# Patient Record
Sex: Male | Born: 1955
Health system: Southern US, Community
[De-identification: ages and names within clinical notes are randomized; demographics above are authoritative.]

## PROBLEM LIST (undated history)

## (undated) DIAGNOSIS — R739 Hyperglycemia, unspecified: Secondary | ICD-10-CM

## (undated) DIAGNOSIS — I639 Cerebral infarction, unspecified: Secondary | ICD-10-CM

## (undated) DIAGNOSIS — F319 Bipolar disorder, unspecified: Secondary | ICD-10-CM

## (undated) DIAGNOSIS — J189 Pneumonia, unspecified organism: Secondary | ICD-10-CM

## (undated) DIAGNOSIS — K921 Melena: Secondary | ICD-10-CM

## (undated) DIAGNOSIS — Z8673 Personal history of transient ischemic attack (TIA), and cerebral infarction without residual deficits: Secondary | ICD-10-CM

## (undated) DIAGNOSIS — G894 Chronic pain syndrome: Secondary | ICD-10-CM

## (undated) DIAGNOSIS — R51 Headache: Secondary | ICD-10-CM

## (undated) DIAGNOSIS — E119 Type 2 diabetes mellitus without complications: Secondary | ICD-10-CM

## (undated) DIAGNOSIS — M109 Gout, unspecified: Secondary | ICD-10-CM

## (undated) DIAGNOSIS — I442 Atrioventricular block, complete: Secondary | ICD-10-CM

## (undated) DIAGNOSIS — I219 Acute myocardial infarction, unspecified: Secondary | ICD-10-CM

## (undated) DIAGNOSIS — F419 Anxiety disorder, unspecified: Secondary | ICD-10-CM

## (undated) DIAGNOSIS — M4802 Spinal stenosis, cervical region: Secondary | ICD-10-CM

## (undated) DIAGNOSIS — K625 Hemorrhage of anus and rectum: Secondary | ICD-10-CM

## (undated) DIAGNOSIS — R569 Unspecified convulsions: Secondary | ICD-10-CM

## (undated) DIAGNOSIS — F1910 Other psychoactive substance abuse, uncomplicated: Secondary | ICD-10-CM

## (undated) DIAGNOSIS — Z7901 Long term (current) use of anticoagulants: Secondary | ICD-10-CM

## (undated) DIAGNOSIS — R42 Dizziness and giddiness: Secondary | ICD-10-CM

## (undated) DIAGNOSIS — Z86711 Personal history of pulmonary embolism: Secondary | ICD-10-CM

## (undated) DIAGNOSIS — I10 Essential (primary) hypertension: Secondary | ICD-10-CM

## (undated) DIAGNOSIS — K219 Gastro-esophageal reflux disease without esophagitis: Secondary | ICD-10-CM

## (undated) DIAGNOSIS — M1990 Unspecified osteoarthritis, unspecified site: Secondary | ICD-10-CM

## (undated) DIAGNOSIS — E785 Hyperlipidemia, unspecified: Secondary | ICD-10-CM

## (undated) DIAGNOSIS — G43909 Migraine, unspecified, not intractable, without status migrainosus: Secondary | ICD-10-CM

## (undated) DIAGNOSIS — N183 Chronic kidney disease, stage 3 (moderate): Secondary | ICD-10-CM

## (undated) DIAGNOSIS — D649 Anemia, unspecified: Secondary | ICD-10-CM

## (undated) DIAGNOSIS — N179 Acute kidney failure, unspecified: Secondary | ICD-10-CM

## (undated) DIAGNOSIS — E7800 Pure hypercholesterolemia, unspecified: Secondary | ICD-10-CM

## (undated) DIAGNOSIS — I2510 Atherosclerotic heart disease of native coronary artery without angina pectoris: Secondary | ICD-10-CM

## (undated) DIAGNOSIS — R519 Headache, unspecified: Secondary | ICD-10-CM

## (undated) DIAGNOSIS — F329 Major depressive disorder, single episode, unspecified: Secondary | ICD-10-CM

## (undated) DIAGNOSIS — I209 Angina pectoris, unspecified: Secondary | ICD-10-CM

## (undated) DIAGNOSIS — F1410 Cocaine abuse, uncomplicated: Secondary | ICD-10-CM

## (undated) DIAGNOSIS — D72819 Decreased white blood cell count, unspecified: Secondary | ICD-10-CM

## (undated) DIAGNOSIS — N289 Disorder of kidney and ureter, unspecified: Secondary | ICD-10-CM

## (undated) DIAGNOSIS — F32A Depression, unspecified: Secondary | ICD-10-CM

## (undated) HISTORY — DX: Hyperlipidemia, unspecified: E78.5

## (undated) HISTORY — DX: Cerebral infarction, unspecified: I63.9

## (undated) HISTORY — DX: Long term (current) use of anticoagulants: Z79.01

## (undated) HISTORY — DX: Personal history of pulmonary embolism: Z86.711

## (undated) HISTORY — DX: Hyperglycemia, unspecified: R73.9

## (undated) HISTORY — DX: Type 2 diabetes mellitus without complications: E11.9

## (undated) HISTORY — DX: Acute myocardial infarction, unspecified: I21.9

## (undated) HISTORY — DX: Essential (primary) hypertension: I10

## (undated) HISTORY — DX: Pure hypercholesterolemia, unspecified: E78.00

## (undated) HISTORY — DX: Melena: K92.1

## (undated) HISTORY — DX: Chronic kidney disease, stage 3 (moderate): N18.3

## (undated) HISTORY — DX: Other psychoactive substance abuse, uncomplicated: F19.10

## (undated) HISTORY — DX: Unspecified convulsions: R56.9

## (undated) HISTORY — PX: SHOULDER SURGERY: SHX246

## (undated) HISTORY — PX: CARDIAC CATHETERIZATION: SHX172

## (undated) HISTORY — PX: CORONARY ANGIOPLASTY: SHX604

## (undated) HISTORY — PX: EYE SURGERY: SHX253

## (undated) HISTORY — DX: Gout, unspecified: M10.9

## (undated) HISTORY — DX: Acute kidney failure, unspecified: N17.9

## (undated) HISTORY — DX: Atrioventricular block, complete: I44.2

## (undated) HISTORY — PX: RECONSTRUCT / STABILIZE DISTAL ULNA: SUR1072

## (undated) HISTORY — DX: Decreased white blood cell count, unspecified: D72.819

## (undated) HISTORY — DX: Chronic pain syndrome: G89.4

## (undated) HISTORY — DX: Gastro-esophageal reflux disease without esophagitis: K21.9

## (undated) HISTORY — DX: Migraine, unspecified, not intractable, without status migrainosus: G43.909

## (undated) HISTORY — PX: THUMB FUSION: SUR636

## (undated) HISTORY — DX: Anemia, unspecified: D64.9

## (undated) HISTORY — PX: INGUINAL HERNIA REPAIR: SUR1180

## (undated) HISTORY — DX: Personal history of transient ischemic attack (TIA), and cerebral infarction without residual deficits: Z86.73

---

## 1998-04-11 ENCOUNTER — Emergency Department (HOSPITAL_COMMUNITY): Admission: EM | Admit: 1998-04-11 | Discharge: 1998-04-11 | Payer: Self-pay | Admitting: Emergency Medicine

## 1999-05-26 ENCOUNTER — Emergency Department (HOSPITAL_COMMUNITY): Admission: EM | Admit: 1999-05-26 | Discharge: 1999-05-26 | Payer: Self-pay | Admitting: Emergency Medicine

## 2000-08-14 ENCOUNTER — Encounter: Payer: Self-pay | Admitting: Emergency Medicine

## 2000-08-14 ENCOUNTER — Emergency Department (HOSPITAL_COMMUNITY): Admission: EM | Admit: 2000-08-14 | Discharge: 2000-08-15 | Payer: Self-pay | Admitting: Emergency Medicine

## 2001-02-14 ENCOUNTER — Emergency Department (HOSPITAL_COMMUNITY): Admission: EM | Admit: 2001-02-14 | Discharge: 2001-02-14 | Payer: Self-pay | Admitting: Emergency Medicine

## 2001-02-14 ENCOUNTER — Encounter: Payer: Self-pay | Admitting: Emergency Medicine

## 2001-03-15 ENCOUNTER — Ambulatory Visit (HOSPITAL_COMMUNITY): Admission: RE | Admit: 2001-03-15 | Discharge: 2001-03-15 | Payer: Self-pay | Admitting: Orthopedic Surgery

## 2001-03-15 ENCOUNTER — Encounter: Payer: Self-pay | Admitting: Orthopedic Surgery

## 2003-01-16 ENCOUNTER — Emergency Department (HOSPITAL_COMMUNITY): Admission: EM | Admit: 2003-01-16 | Discharge: 2003-01-16 | Payer: Self-pay | Admitting: Emergency Medicine

## 2003-02-02 ENCOUNTER — Ambulatory Visit (HOSPITAL_BASED_OUTPATIENT_CLINIC_OR_DEPARTMENT_OTHER): Admission: RE | Admit: 2003-02-02 | Discharge: 2003-02-02 | Payer: Self-pay | Admitting: General Surgery

## 2003-02-02 HISTORY — PX: INGUINAL HERNIA REPAIR: SUR1180

## 2003-05-30 ENCOUNTER — Emergency Department (HOSPITAL_COMMUNITY): Admission: EM | Admit: 2003-05-30 | Discharge: 2003-05-30 | Payer: Self-pay | Admitting: Emergency Medicine

## 2004-04-18 ENCOUNTER — Emergency Department (HOSPITAL_COMMUNITY): Admission: EM | Admit: 2004-04-18 | Discharge: 2004-04-18 | Payer: Self-pay | Admitting: Emergency Medicine

## 2004-05-22 ENCOUNTER — Ambulatory Visit (HOSPITAL_COMMUNITY): Admission: RE | Admit: 2004-05-22 | Discharge: 2004-05-22 | Payer: Self-pay | Admitting: General Surgery

## 2004-05-22 ENCOUNTER — Ambulatory Visit (HOSPITAL_BASED_OUTPATIENT_CLINIC_OR_DEPARTMENT_OTHER): Admission: RE | Admit: 2004-05-22 | Discharge: 2004-05-22 | Payer: Self-pay | Admitting: General Surgery

## 2005-05-28 ENCOUNTER — Emergency Department (HOSPITAL_COMMUNITY): Admission: EM | Admit: 2005-05-28 | Discharge: 2005-05-28 | Payer: Self-pay | Admitting: Emergency Medicine

## 2005-10-22 ENCOUNTER — Emergency Department (HOSPITAL_COMMUNITY): Admission: EM | Admit: 2005-10-22 | Discharge: 2005-10-22 | Payer: Self-pay | Admitting: Emergency Medicine

## 2006-03-05 ENCOUNTER — Emergency Department (HOSPITAL_COMMUNITY): Admission: EM | Admit: 2006-03-05 | Discharge: 2006-03-05 | Payer: Self-pay | Admitting: Emergency Medicine

## 2006-07-26 IMAGING — CR DG KNEE COMPLETE 4+V*R*
4 series · 4 of 4 positions shown · non-contrast
Comparison: none

CLINICAL DATA: Fell

Right knee 4 view:
Comparison 02/14/2001. There is no evidence of fracture.  Normal alignment. There
is no evidence of arthropathy or other focal bone abnormality.  Soft tissues are
unremarkable.

[t knee ap right]
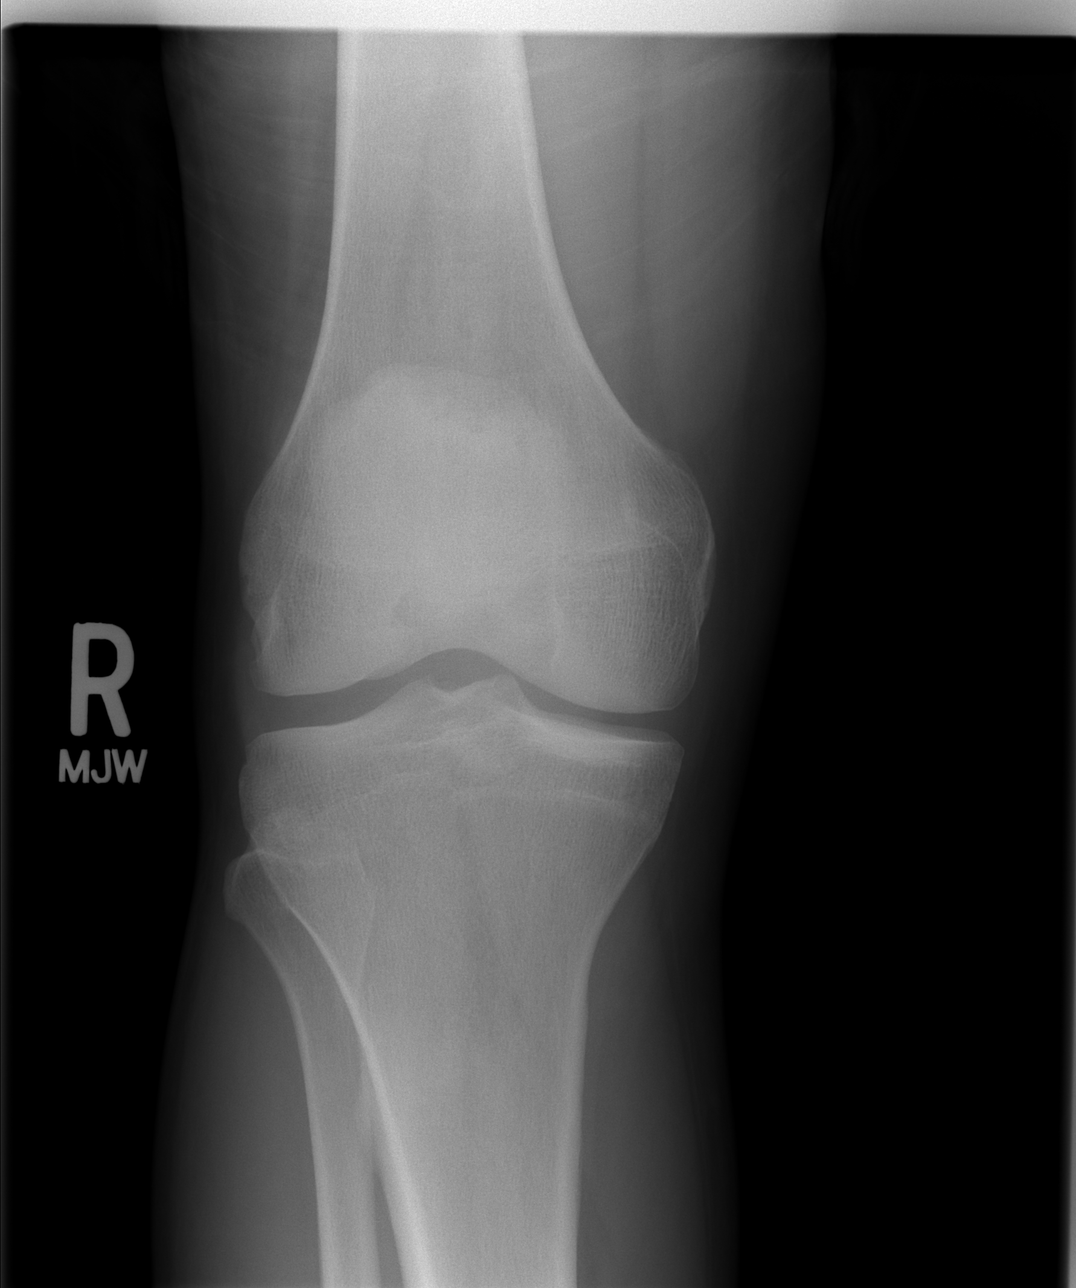

[t knee oblique right (1 of 2)]
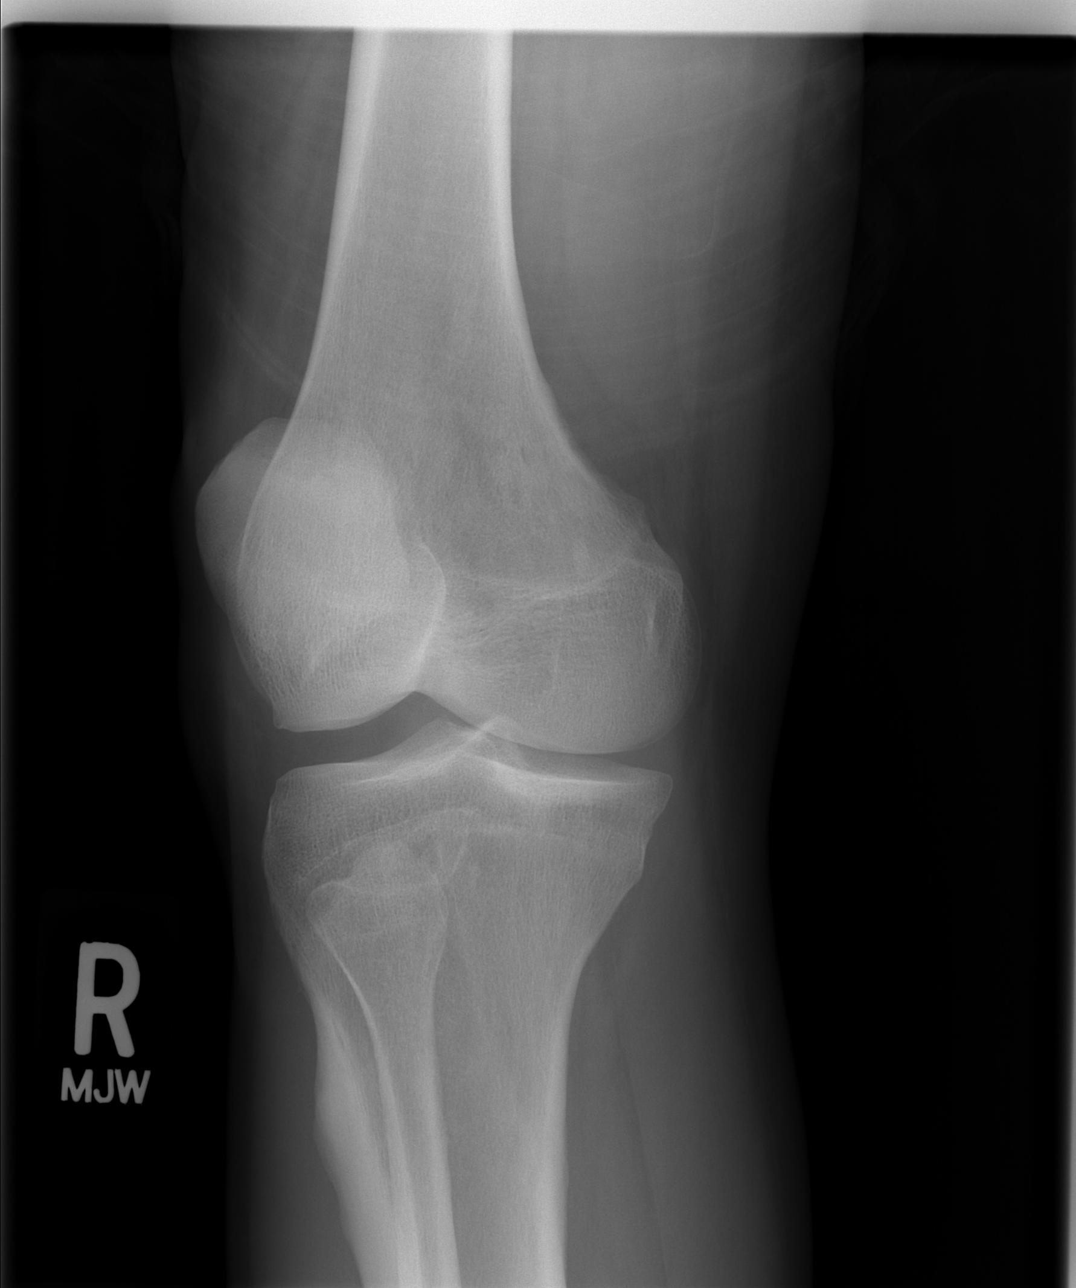

[t knee oblique right (2 of 2)]
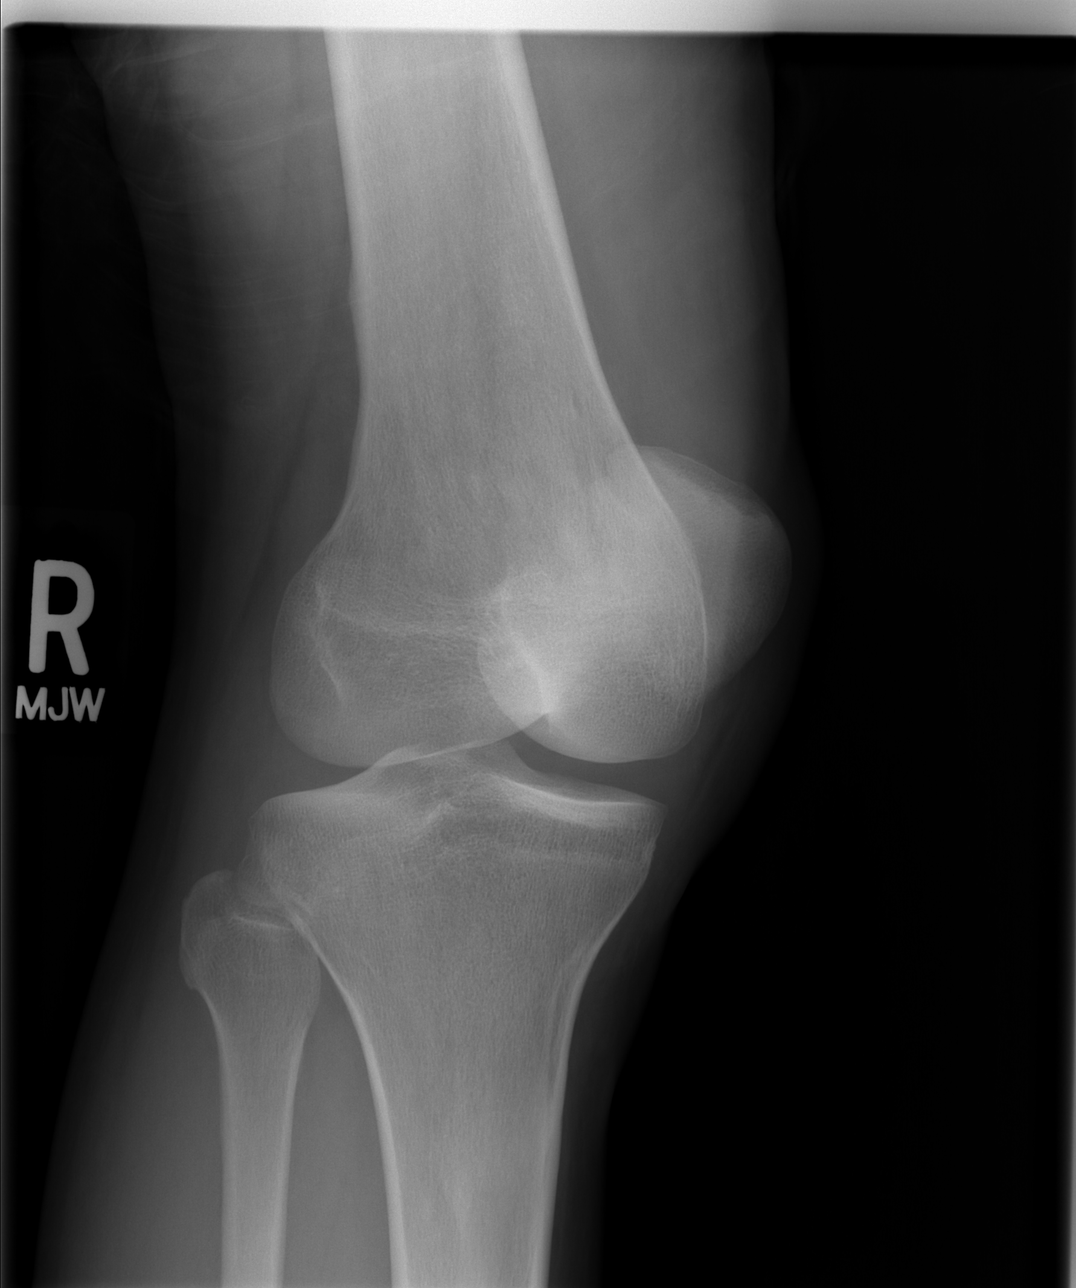

[t knee lat right]
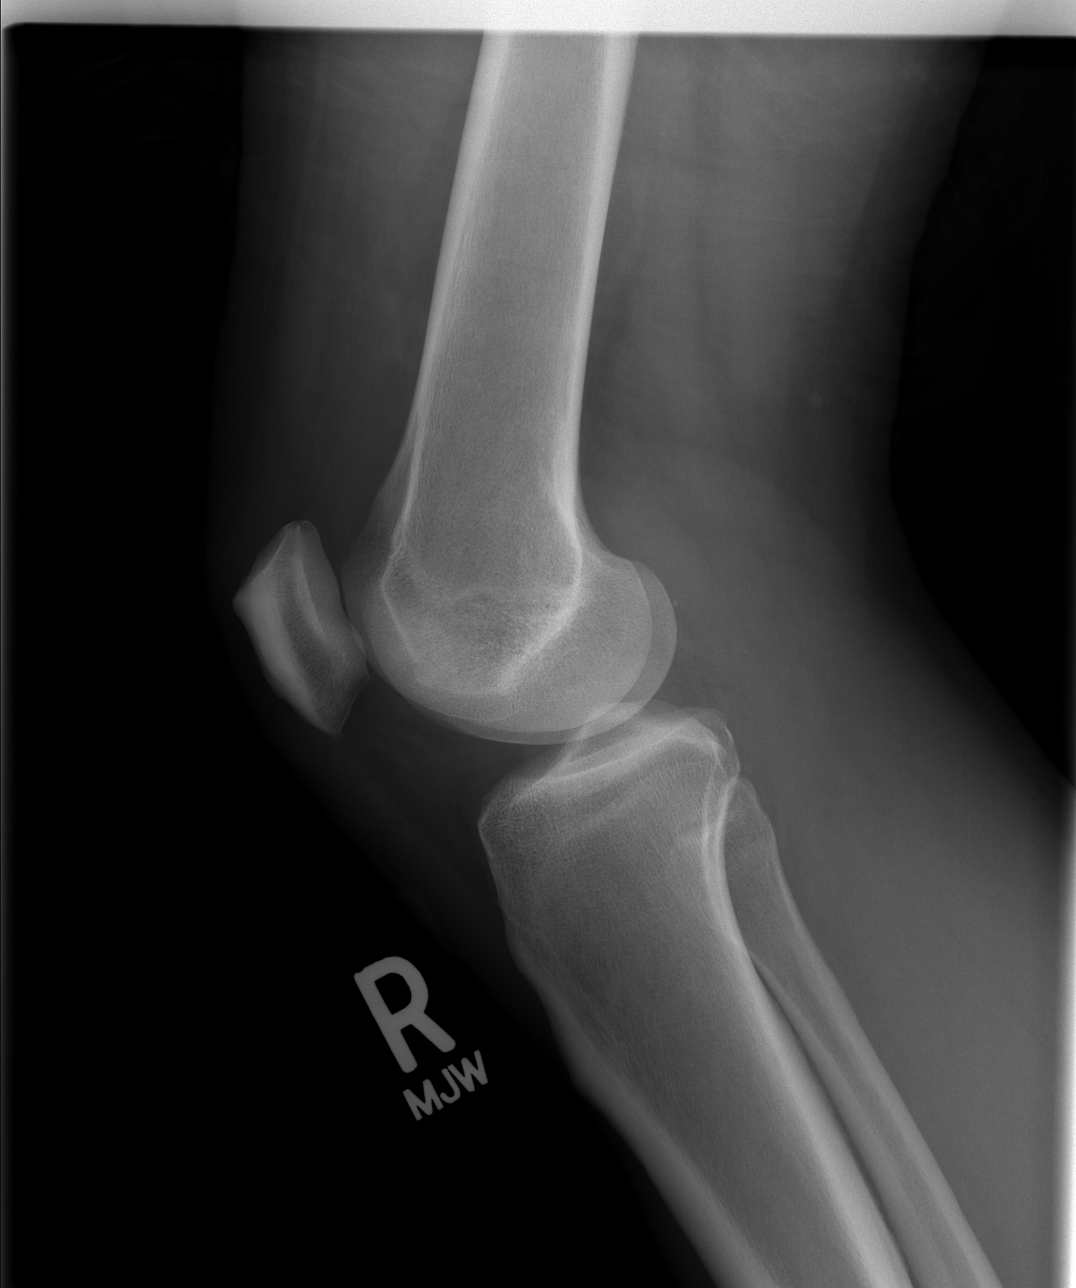

[4 of 4 positions shown; findings below may reference images not displayed]

IMPRESSION: Negative.

## 2006-08-27 ENCOUNTER — Emergency Department (HOSPITAL_COMMUNITY): Admission: EM | Admit: 2006-08-27 | Discharge: 2006-08-27 | Payer: Self-pay | Admitting: Emergency Medicine

## 2006-10-05 ENCOUNTER — Emergency Department (HOSPITAL_COMMUNITY): Admission: EM | Admit: 2006-10-05 | Discharge: 2006-10-05 | Payer: Self-pay | Admitting: Emergency Medicine

## 2006-10-22 DIAGNOSIS — I219 Acute myocardial infarction, unspecified: Secondary | ICD-10-CM

## 2006-10-22 HISTORY — DX: Acute myocardial infarction, unspecified: I21.9

## 2007-03-24 ENCOUNTER — Emergency Department (HOSPITAL_COMMUNITY): Admission: EM | Admit: 2007-03-24 | Discharge: 2007-03-25 | Payer: Self-pay | Admitting: Emergency Medicine

## 2007-03-29 ENCOUNTER — Ambulatory Visit (HOSPITAL_COMMUNITY): Admission: RE | Admit: 2007-03-29 | Discharge: 2007-03-29 | Payer: Self-pay | Admitting: Emergency Medicine

## 2007-05-17 ENCOUNTER — Inpatient Hospital Stay (HOSPITAL_COMMUNITY): Admission: EM | Admit: 2007-05-17 | Discharge: 2007-05-19 | Payer: Self-pay | Admitting: Emergency Medicine

## 2007-05-17 ENCOUNTER — Ambulatory Visit: Payer: Self-pay | Admitting: *Deleted

## 2007-06-16 ENCOUNTER — Ambulatory Visit: Payer: Self-pay | Admitting: Cardiovascular Disease

## 2007-06-18 ENCOUNTER — Emergency Department (HOSPITAL_COMMUNITY): Admission: EM | Admit: 2007-06-18 | Discharge: 2007-06-18 | Payer: Self-pay | Admitting: Emergency Medicine

## 2007-10-28 ENCOUNTER — Observation Stay (HOSPITAL_COMMUNITY): Admission: EM | Admit: 2007-10-28 | Discharge: 2007-10-29 | Payer: Self-pay | Admitting: Emergency Medicine

## 2007-10-28 ENCOUNTER — Ambulatory Visit: Payer: Self-pay | Admitting: Cardiology

## 2007-11-18 ENCOUNTER — Ambulatory Visit: Payer: Self-pay | Admitting: Cardiovascular Disease

## 2007-12-26 IMAGING — CT CT HEAD W/O CM
4 of 5 series · 16 of 47 positions shown, 17 images · IV contrast (agent unspecified)
Comparison: None

CLINICAL DATA: Headache with right facial numbness. Right shoulder and arm
pain.
TECHNIQUE: 5mm collimated images were obtained from the base of the skull
through the vertex according to standard protocol without contrast.

HEAD CT WITHOUT CONTRAST:
TECHNIQUE: Multidetector CT imaging of the cervical spine was performed. 
Sagittal and coronal plane reformatted images were reconstructed from the axial
CT data, and were also reviewed.

[Series 3: head trauma 4.8 h47s · axial · 0.47mm/px · z∈[-83,-33]mm · 2 of 30 slices shown, 3 images]
[im 10/30  brain]
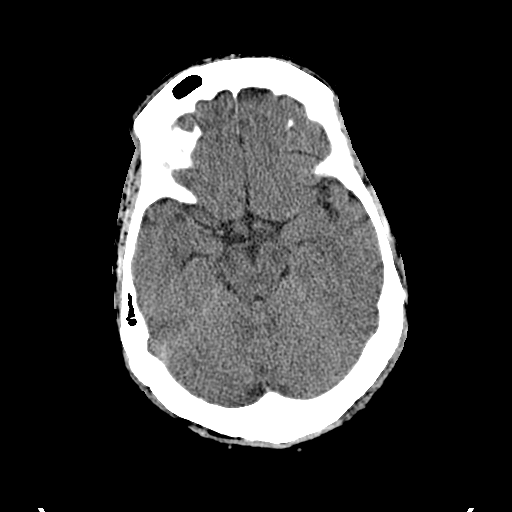
[im 10/30  bone]
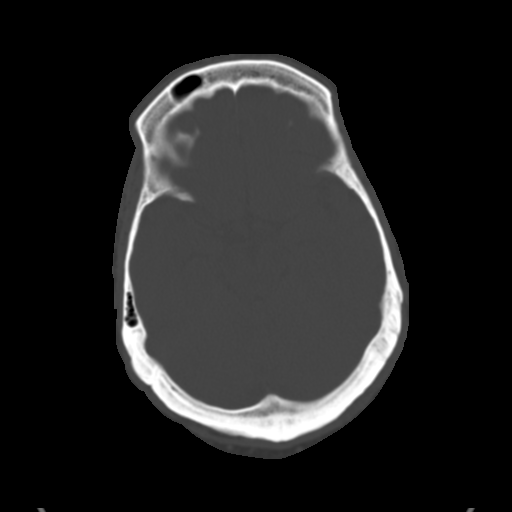
[im 20/30  brain]
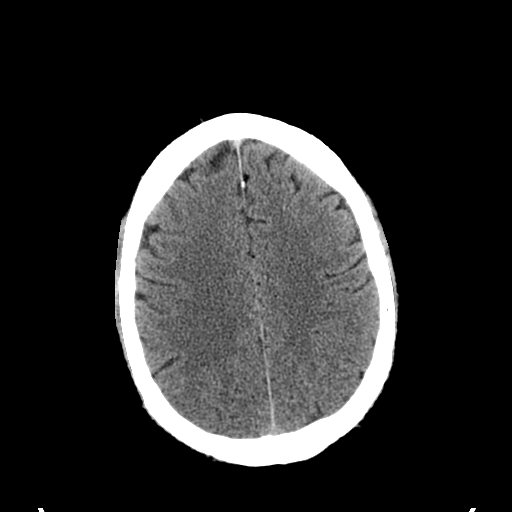

[Series 602: ax · axial · 0.38mm/px · z∈[-311,-201]mm · 8 of 71 slices shown]
[im 8/71  brain]
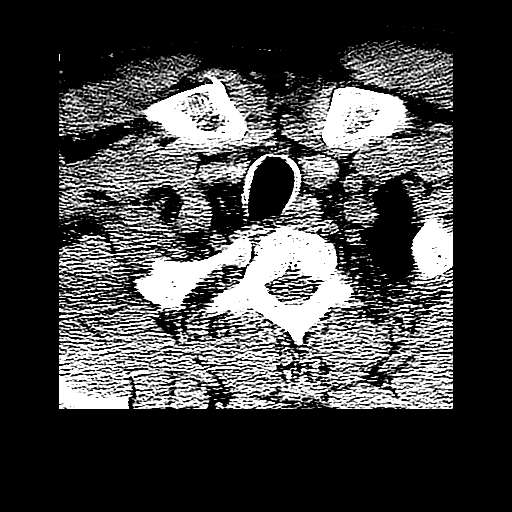
[im 15/71  brain]
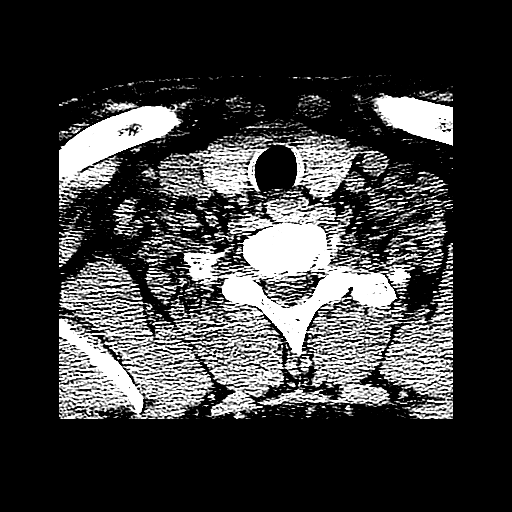
[im 22/71  brain]
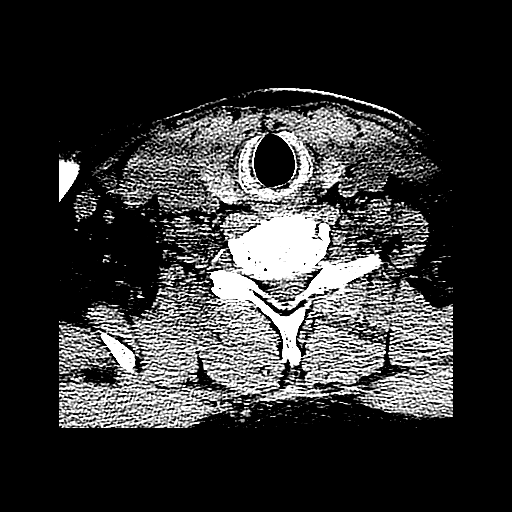
[im 29/71  brain]
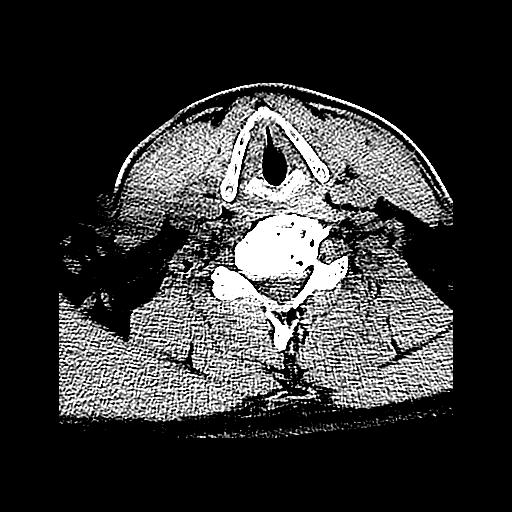
[im 43/71  brain]
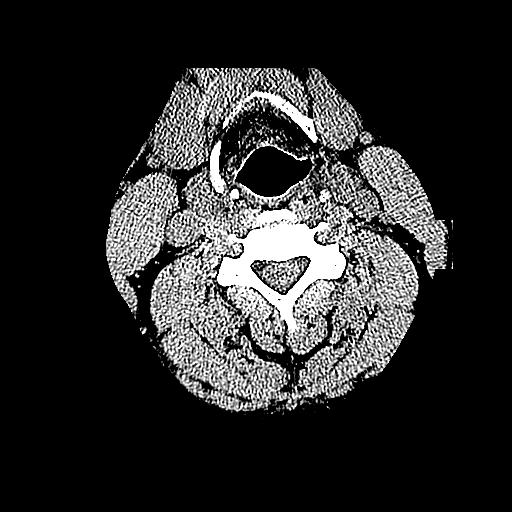
[im 50/71  brain]
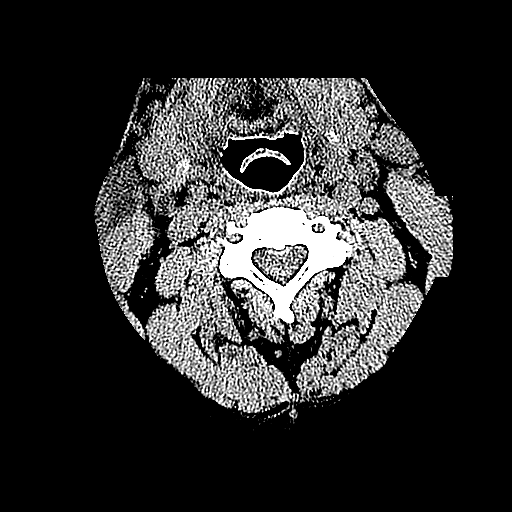
[im 57/71  brain]
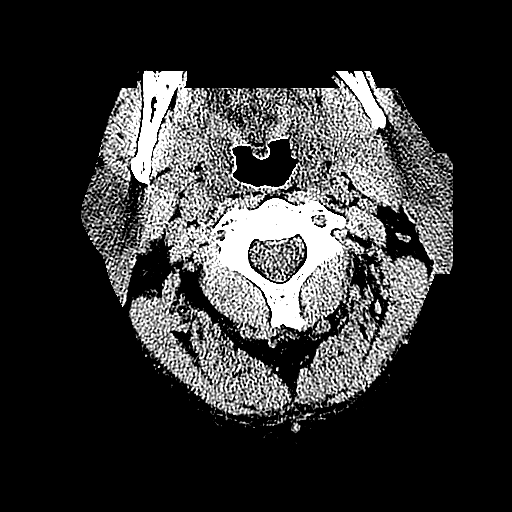
[im 64/71  brain]
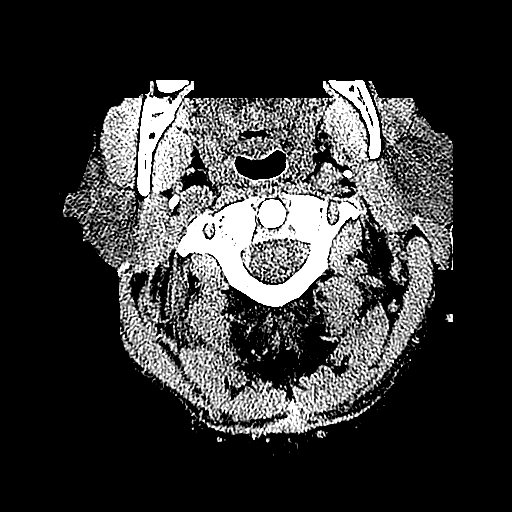

[Series 605: cor · coronal · 0.38mm/px · 3 of 37 slices shown]
[im 13/37  brain]
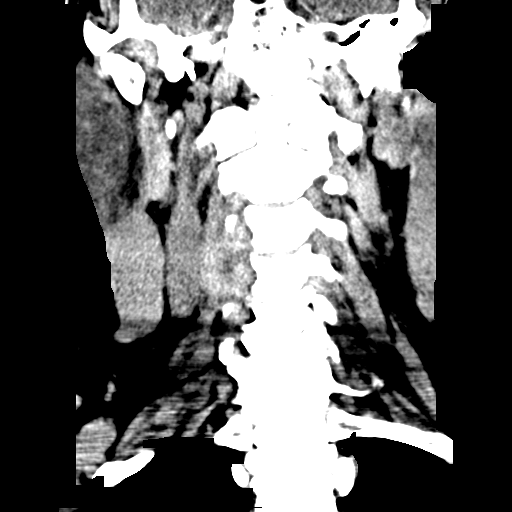
[im 17/37  brain]
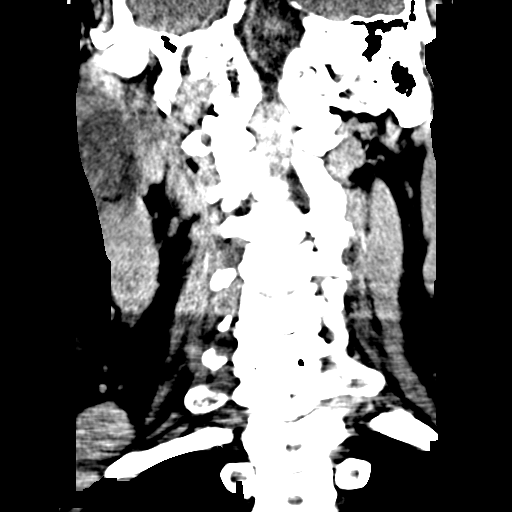
[im 21/37  brain]
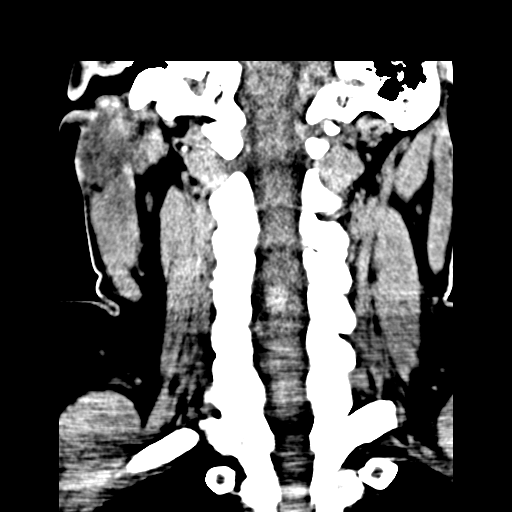

[Series 606: sag · sagittal · 0.38mm/px · 3 of 44 slices shown]
[im 15/44  brain]
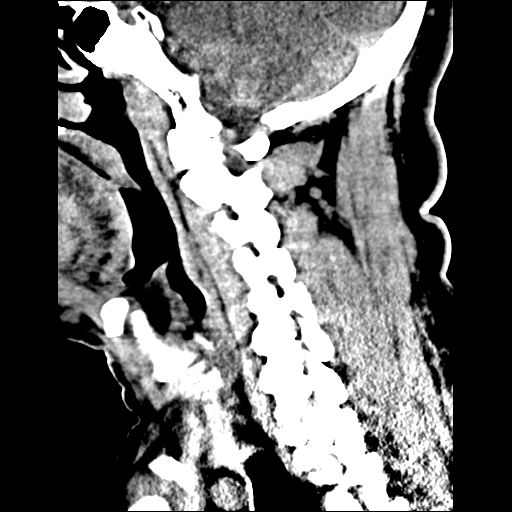
[im 22/44  brain]
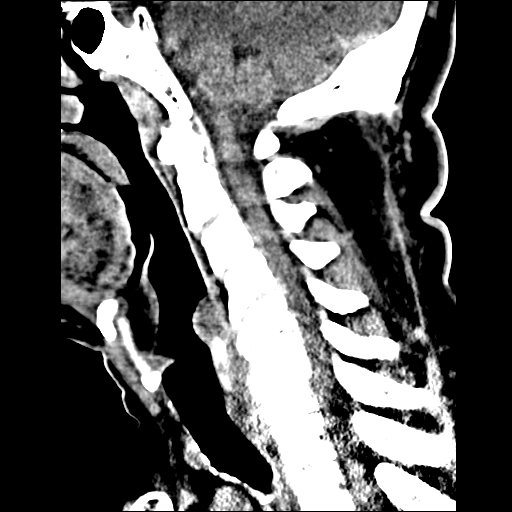
[im 29/44  brain]
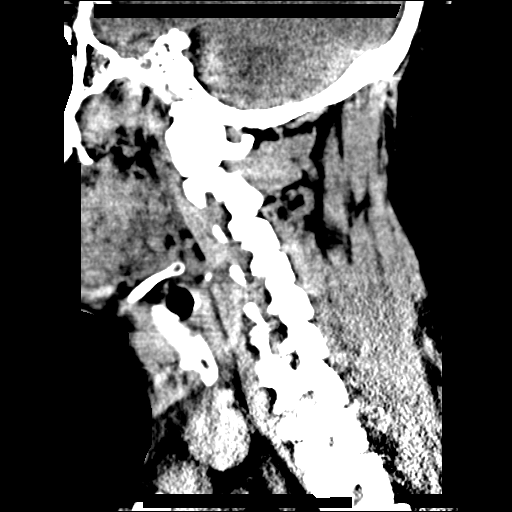

[16 of 47 positions shown; findings below may reference images not displayed]

FINDINGS: There is no evidence for acute hemorrhage, hydrocephalus, mass/mass-effect or
abnormal extra-axial fluid collection.  No definite CT evidence for acute
ischemia.  The visualized paranasal sinuses are clear.
IMPRESSION: No acute intracranial abnormality. 

Areas of acute ischemia may not be apparent on a head CT for up to 24-48 hours.

CERVICAL SPINE CT WITHOUT CONTRAST:
FINDINGS: Imaging was obtained from the skull base through the T2 vertebral
body. There is no evidence for acute fracture or gross subluxation. Reversal of
the normal cervical lordosis is noted. Loss of disc height is seen at C4-C5,
C5-C6, and C6-C7. Trace retrolisthesis of C5 on 6 is probably related to the
loss of disc height at this level. There is degenerative end plate change around
C5-C6 and C6-C7 discs. The facets are well aligned bilaterally.

Axial images reveal a moderate central disc herniation at C4-C5 which
obliterates the anterior thecal space and deforms the cervical spinal cord at
this level. AP diameter of the spinal canal is narrowed to about 8 mm. Foramina
are patent.

At C5-C6, there is posterior broad-based disc bulge with posterior bony endplate
spurring. Spinal stenosis is noted at this level with narrowing of the central
canal to 8 mm in AP diameter. Uncinate spurring causes moderate bilateral
foraminal narrowing.

At C6-C7, posterior disc osteophyte complex generates spinal stenosis with AP
diameter of the canal measuring about 9 mm. Uncinate spurring at this level
generates biforaminal encroachment, right greater than left.
IMPRESSION: No acute bony abnormality.

Degenerative disc disease in the mid cervical spine. There is a focal central
moderate disc herniation at C4-C5 which deforms the spinal cord and generates
central canal stenosis.

Posterior bulging disc with endplate spurring at C5-C6 and C6-C7 with spinal
stenosis at each level. There is associated bony foraminal encroachment
bilaterally at each level as well.

MRI may prove helpful to further evaluate, as warranted.

## 2007-12-31 IMAGING — MR MR CERVICAL SPINE W/O CM
4 of 7 series · 21 of 48 positions shown · IV contrast (agent unspecified)
Comparison: CT cervical spine 03/24/07

CLINICAL DATA: Severe headaches, facial numbness, shooting pain right arm.
MRI CERVICAL SPINE WITHOUT CONTRAST:
TECHNIQUE: Multiplanar and multiecho pulse sequences of the cervical spine, to include the craniocervical junction and cervicothoracic junction, were obtained according to standard protocol without IV contrast.

[Series 1: sag loc · axial · 7.0mm · 1.09mm/px · z∈[+0,+140]mm · 3 of 15 slices shown]
[im 3/15]
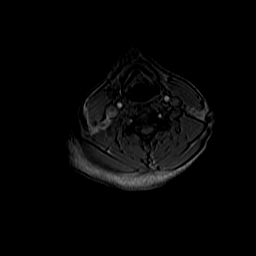
[im 9/15]
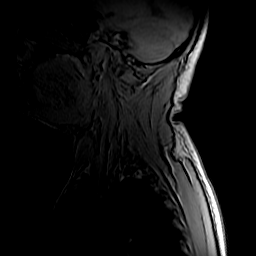
[im 13/15]
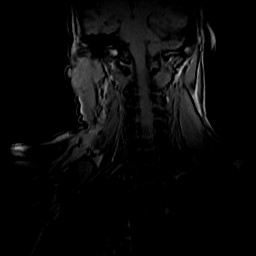

[Series 3: T2 · sagittal · 3.0mm · 0.43mm/px · 5 of 12 slices shown (1 of 2)]
[im 1/12]
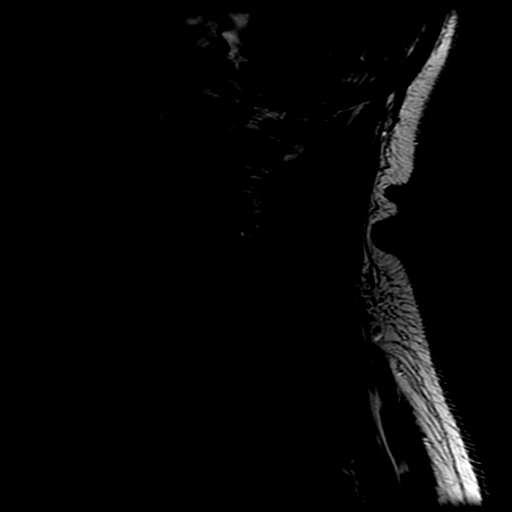
[im 3/12]
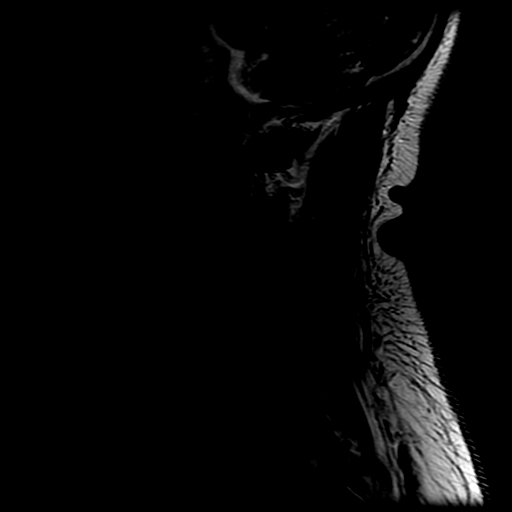
[im 6/12]
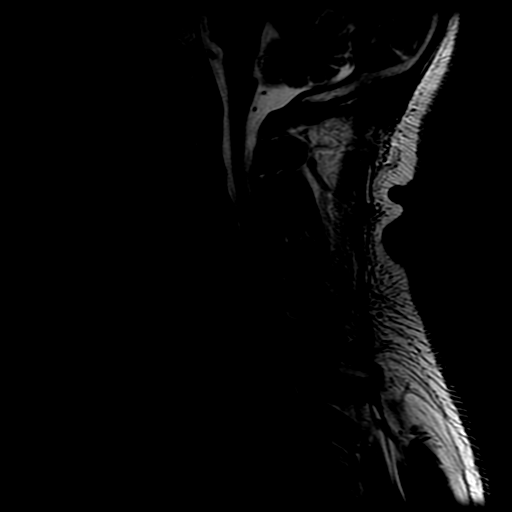
[im 9/12]
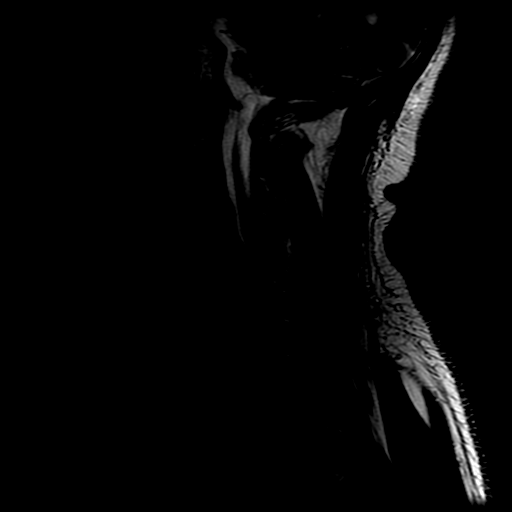
[im 12/12]
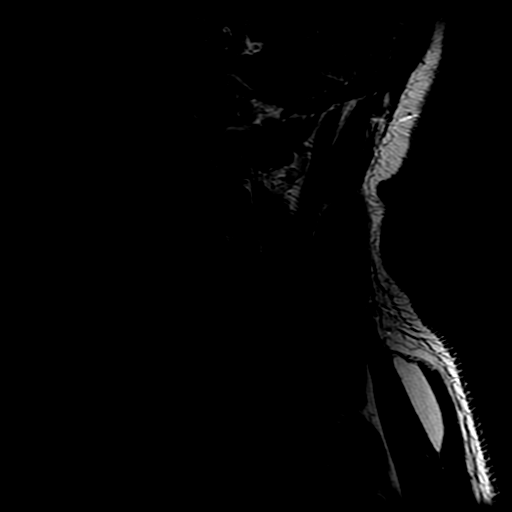

[Series 5: STIR · sagittal · 3.0mm · 0.43mm/px · 5 of 12 slices shown]
[im 1/12]
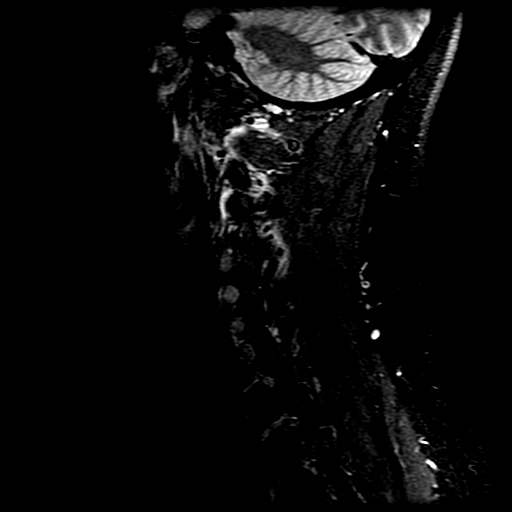
[im 3/12]
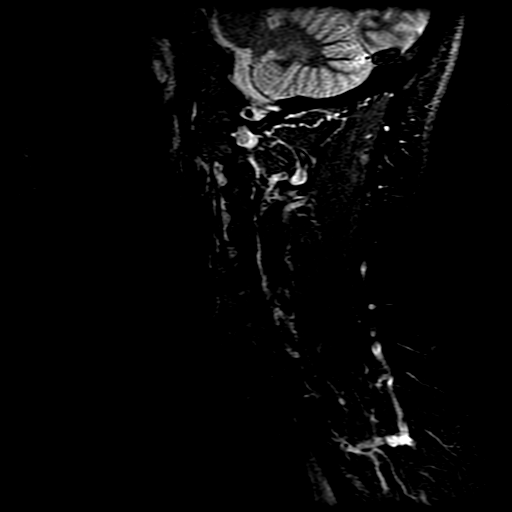
[im 6/12]
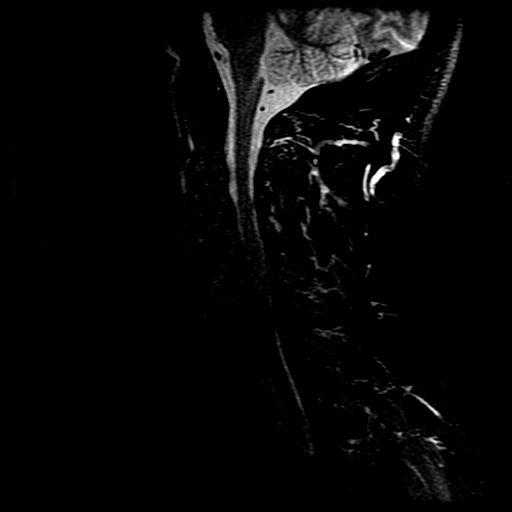
[im 9/12]
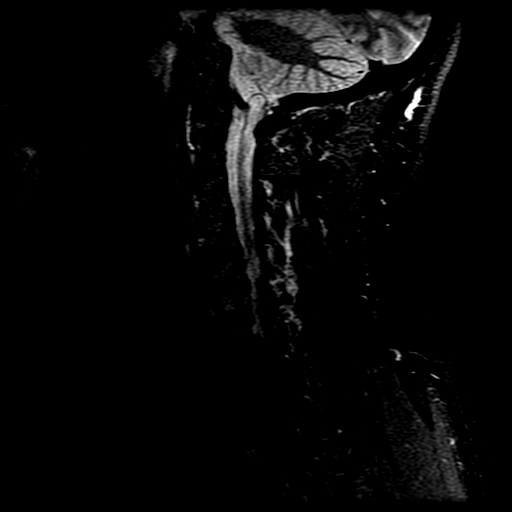
[im 12/12]
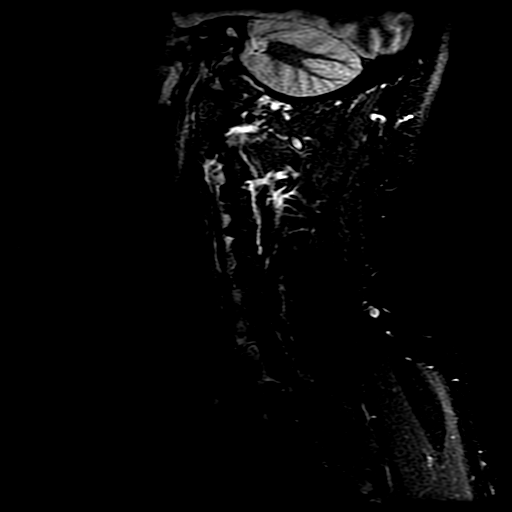

[Series 8: T2 · axial · 3.0mm · 0.39mm/px · z∈[-48,+24]mm · 8 of 22 slices shown (2 of 2)]
[im 1/22]
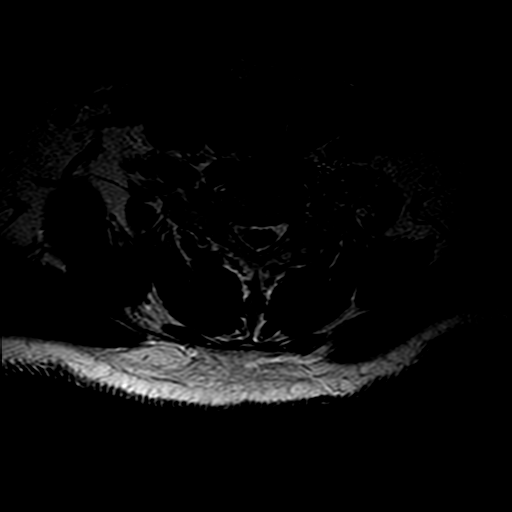
[im 3/22]
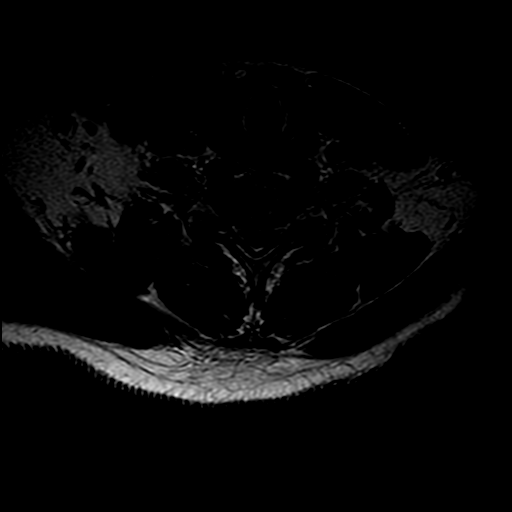
[im 8/22]
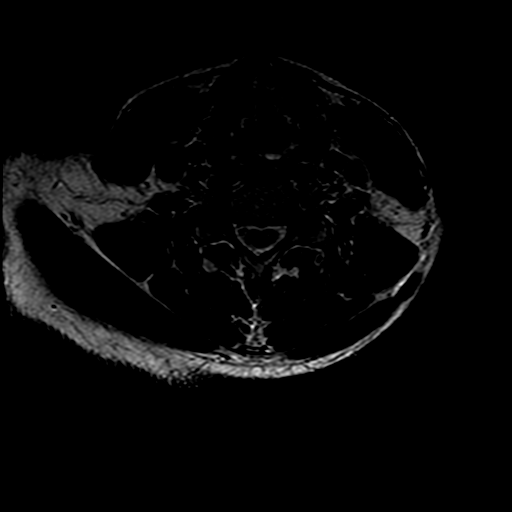
[im 10/22]
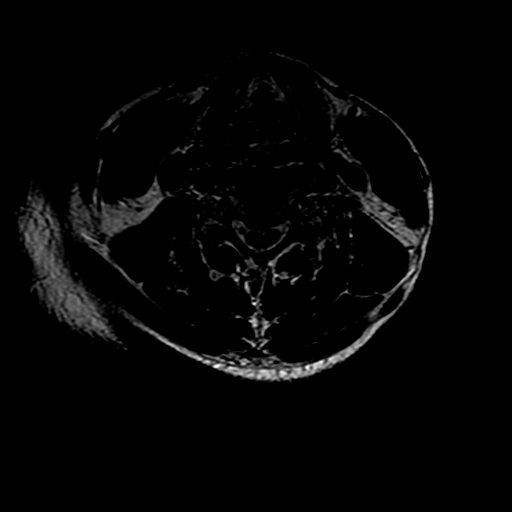
[im 12/22]
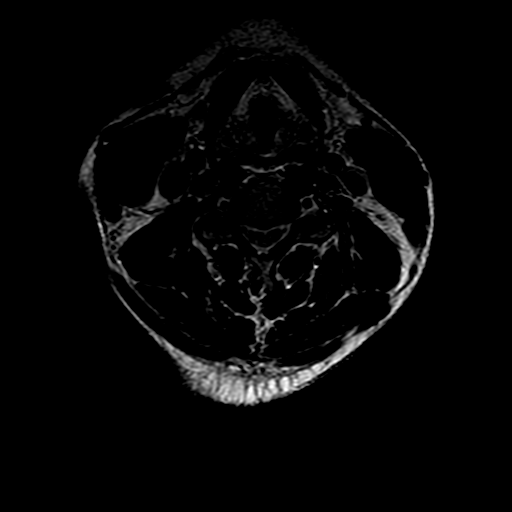
[im 15/22]
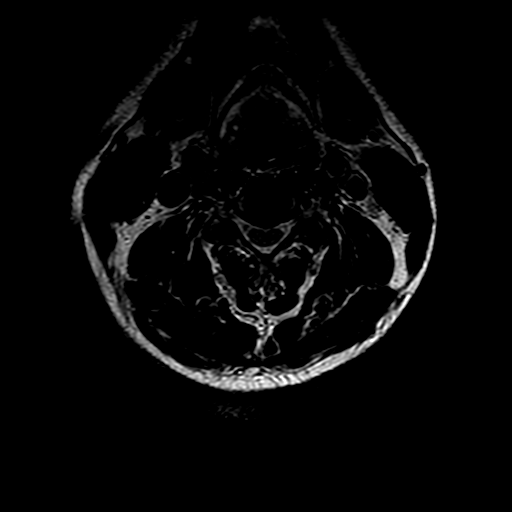
[im 19/22]
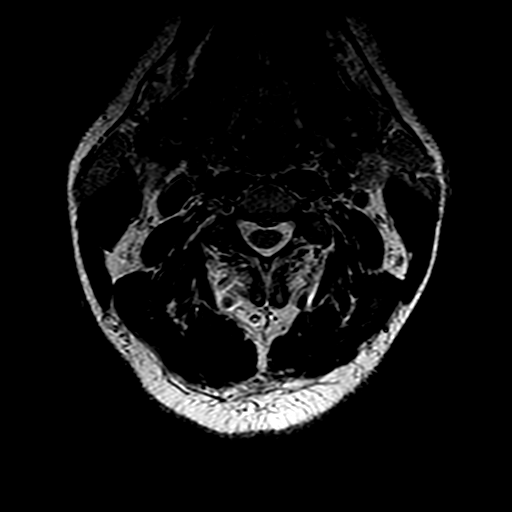
[im 22/22]
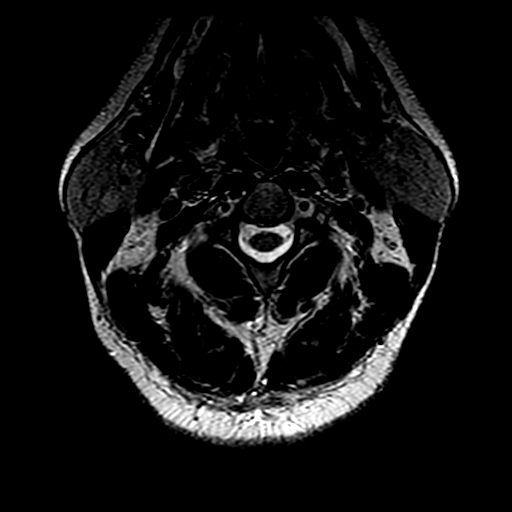

[21 of 48 positions shown; findings below may reference images not displayed]

FINDINGS: Multilevel degenerative change worst at C5-6 with disk space narrowing and disk desiccation.  Marrow signal homogenous.  Trace retrolisthesis of C6 on C7 of 1 to 2 mm felt to be degenerative in nature.  No ligamentous injury or vertebral body compression fracture.  Normal cord signal throughout.  
C2-3:  Normal interspace.
C3-4:  Central protrusion with cord flattening.  Canal diameter 8 mm.  No definite C4 nerve root encroachment however.
C4-5:  Moderate sized protrusion central and to the right.  Right-sided cord flattening.  No definite C5 nerve root encroachment however.  Canal diameter 7 mm.
C5-6:  Central osteophyte formation with bilateral uncinate spurring.  Bilateral C6 nerve root encroachment.  Mild cord flattening.  Canal diameter 8 mm.  
C6-7:  Broad based disk protrusion central and to the right with cord compression and right C7 nerve root encroachment.  Canal diameter is 7 mm.
C7-T1:  Normal interspace.
IMPRESSION: 1.  Multilevel disk disease with central protrusions resulting in cord compression at C3-4, C4-5, and C6-7.
2.  Spondylosis at C5-6 with bilateral bony neural foraminal narrowing.
3.  Most significant right-sided pathology appears to be at C6-7 although potential exists at C4-5 for right-sided nerve root compression.
4.  No MR evidence for ligamentous injury or vertebral body compression fracture.

## 2008-01-16 ENCOUNTER — Emergency Department (HOSPITAL_COMMUNITY): Admission: EM | Admit: 2008-01-16 | Discharge: 2008-01-16 | Payer: Self-pay | Admitting: Emergency Medicine

## 2008-02-18 IMAGING — CR DG WRIST COMPLETE 3+V*L*
2 series · 2 of 2 positions shown · non-contrast
Comparison: none

HISTORY: Left wrist pain, fall

LEFT WRIST 4 VIEWS:
Comparison 10/05/2006
No fracture, dislocation, or bone destruction.
Bone mineralization normal.
Joint spaces preserved.
Slight obliquity on PA view.

[view not recorded (1 of 2)]
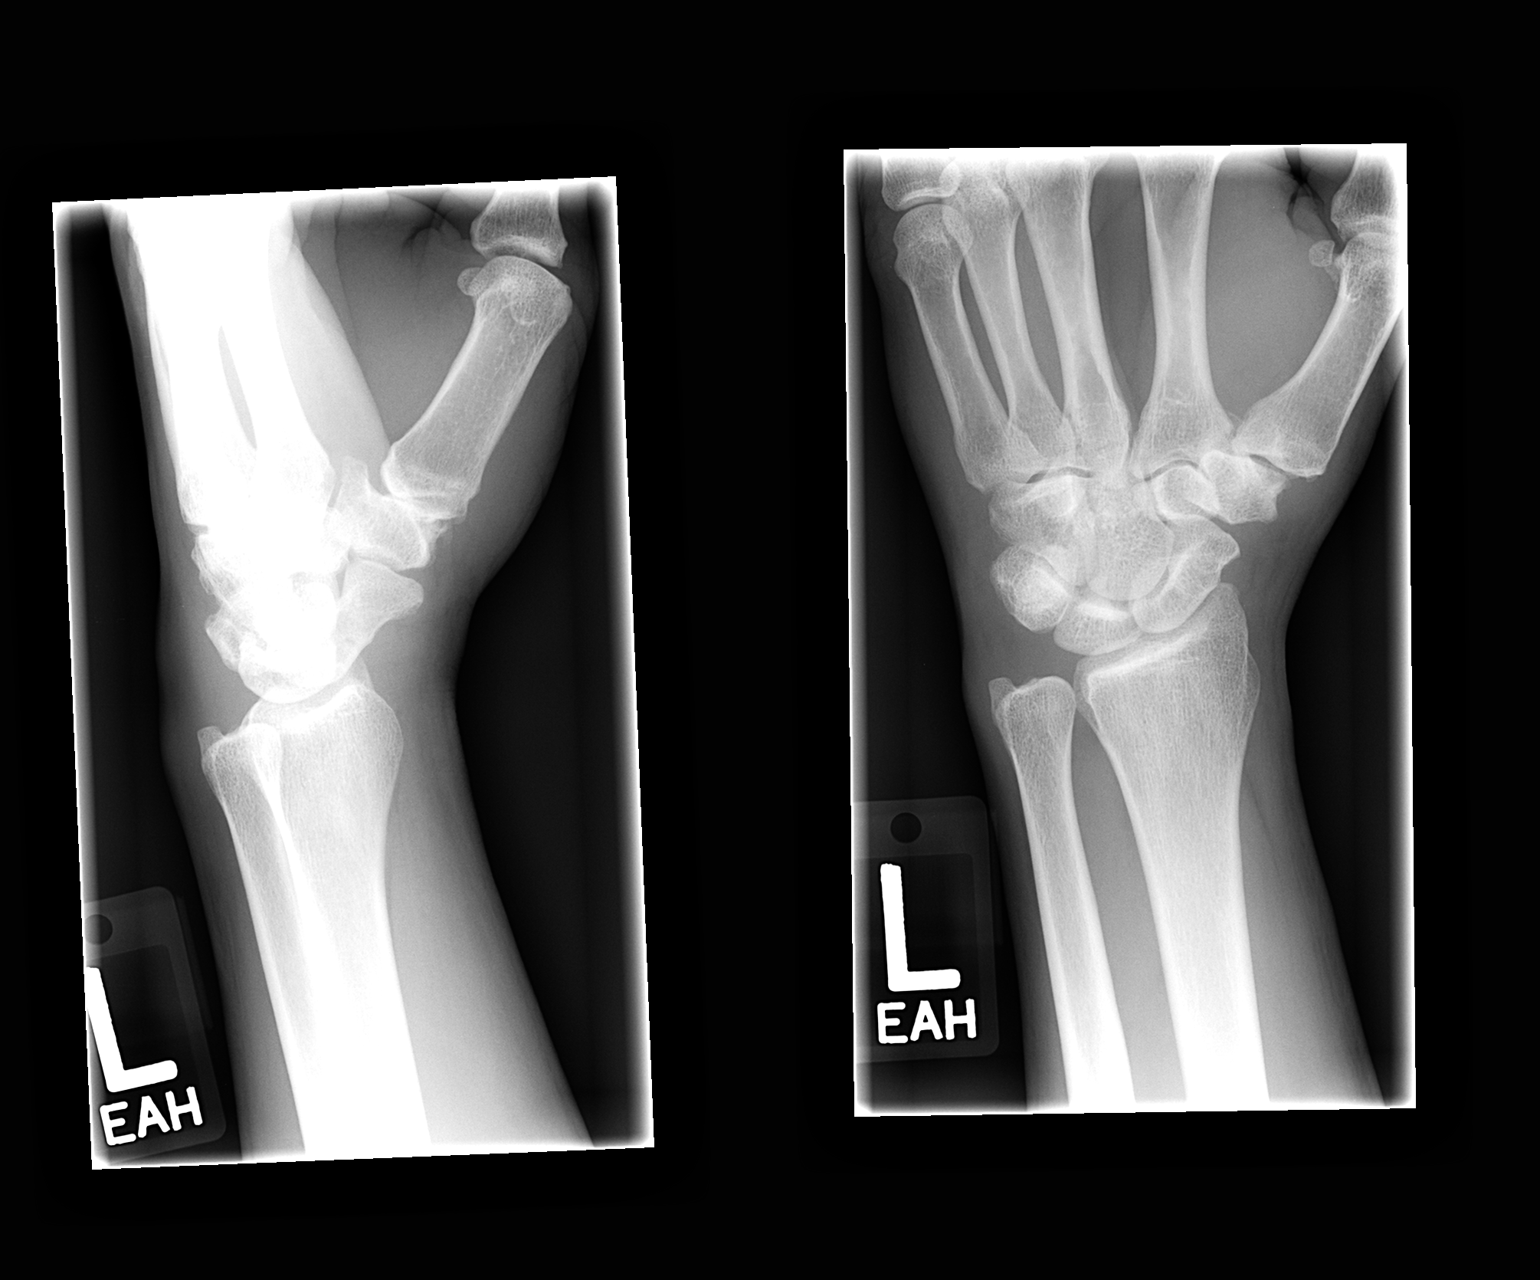

[view not recorded (2 of 2)]
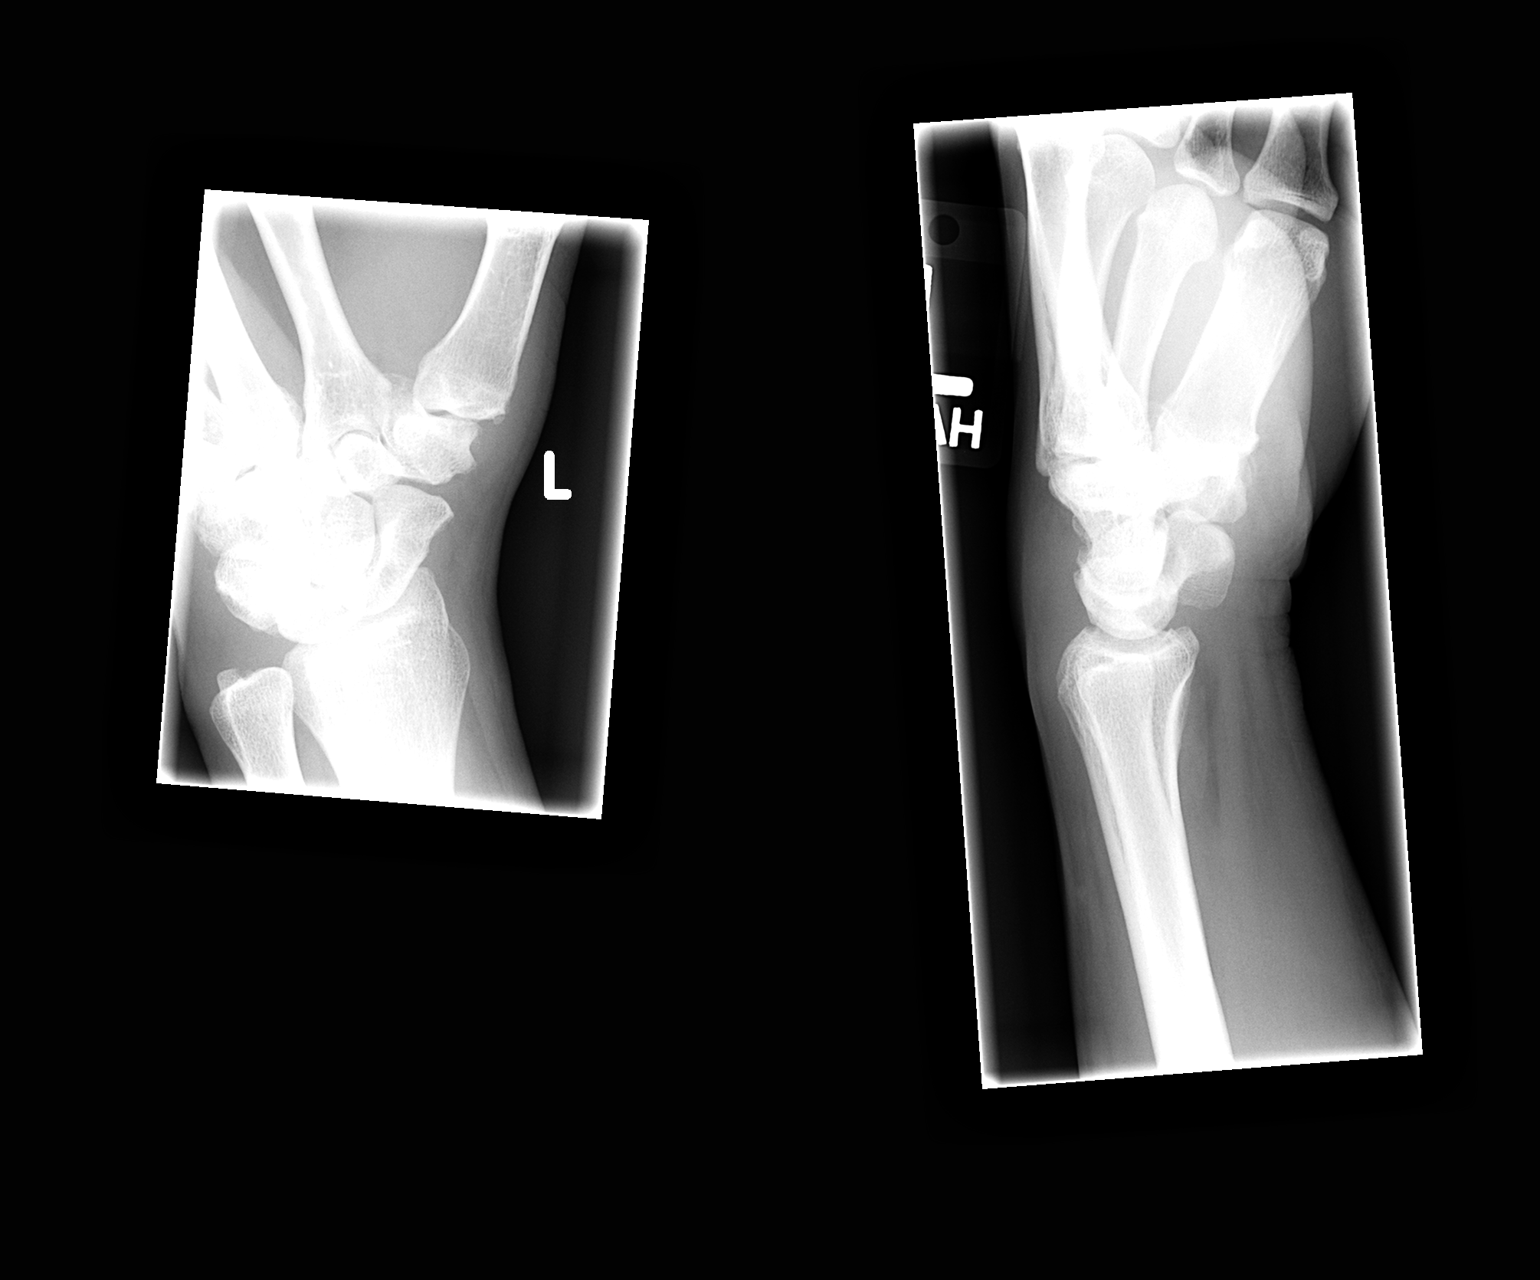

[2 of 2 positions shown; findings below may reference images not displayed]

IMPRESSION: No acute bony abnormalities.

## 2008-07-31 IMAGING — CR DG SHOULDER 2+V*R*
3 series · 3 of 3 positions shown · non-contrast
Comparison: None.

CLINICAL DATA: Right shoulder pain.  No trauma.
 RIGHT SHOULDER ? 3 VIEW:

[view not recorded (1 of 3)]
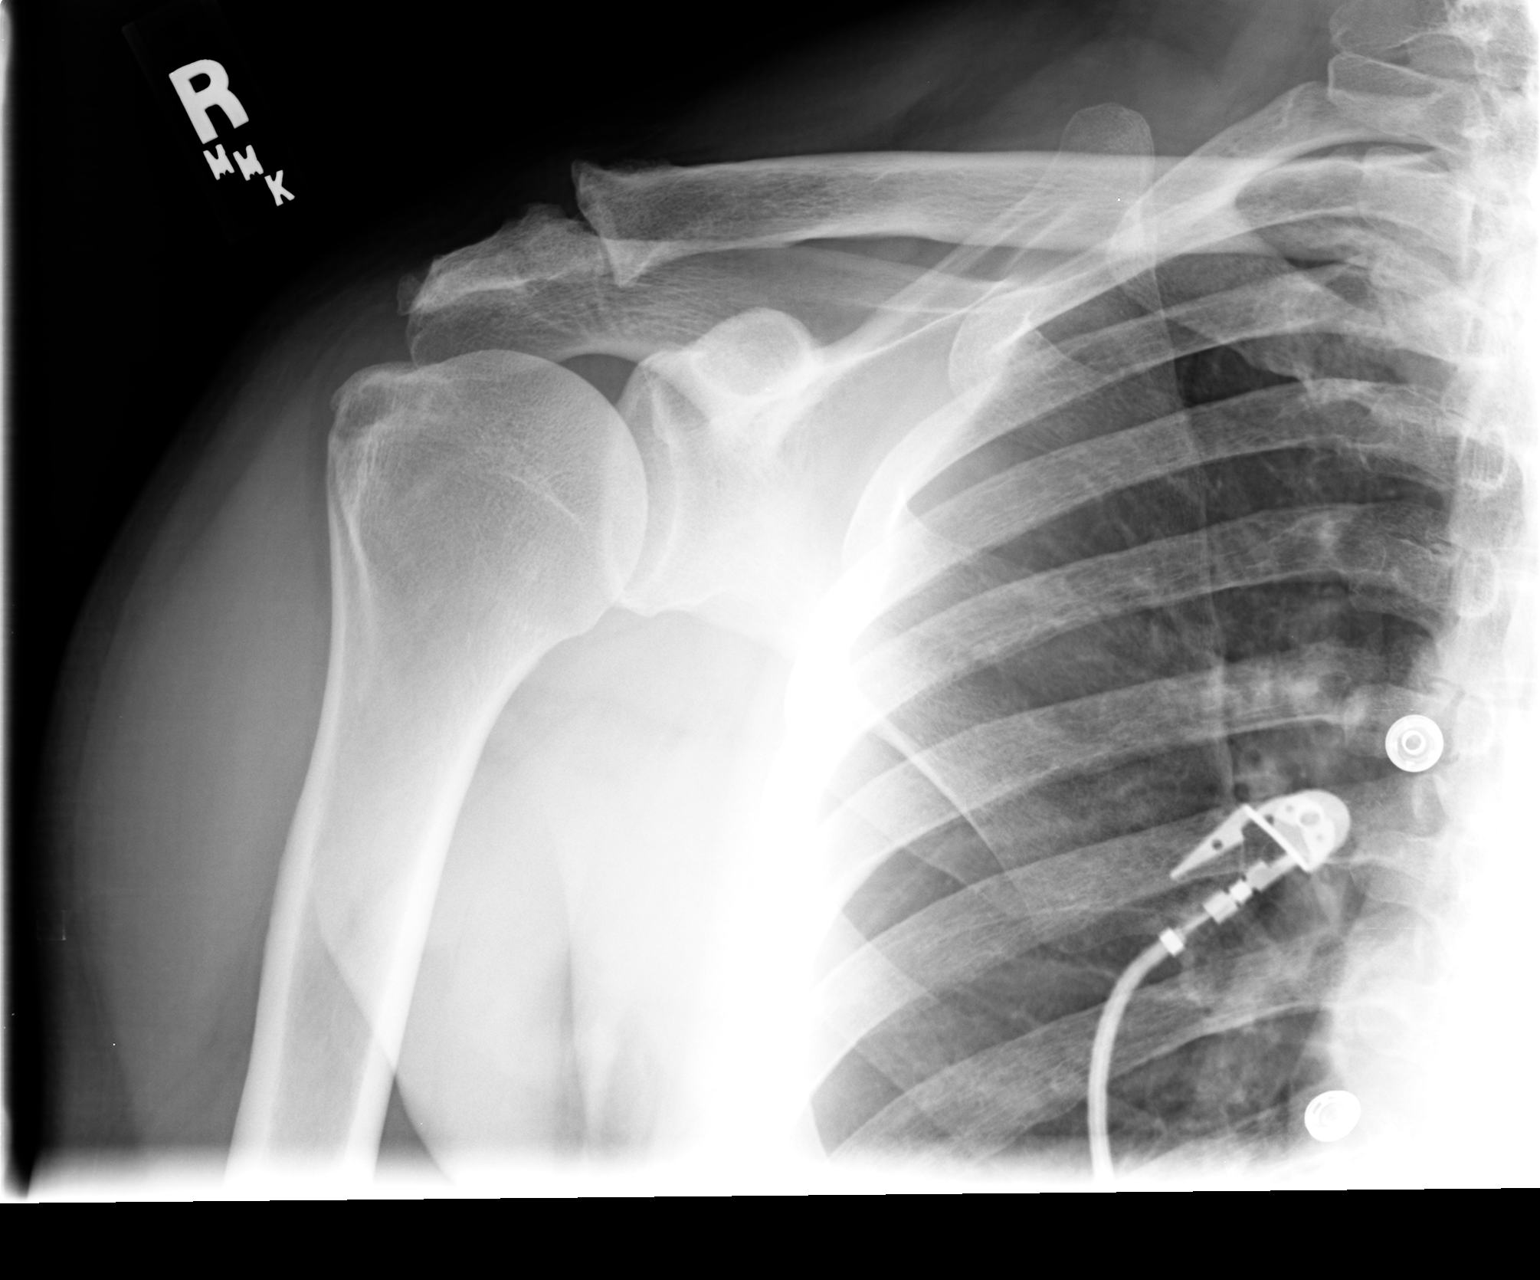

[view not recorded (2 of 3)]
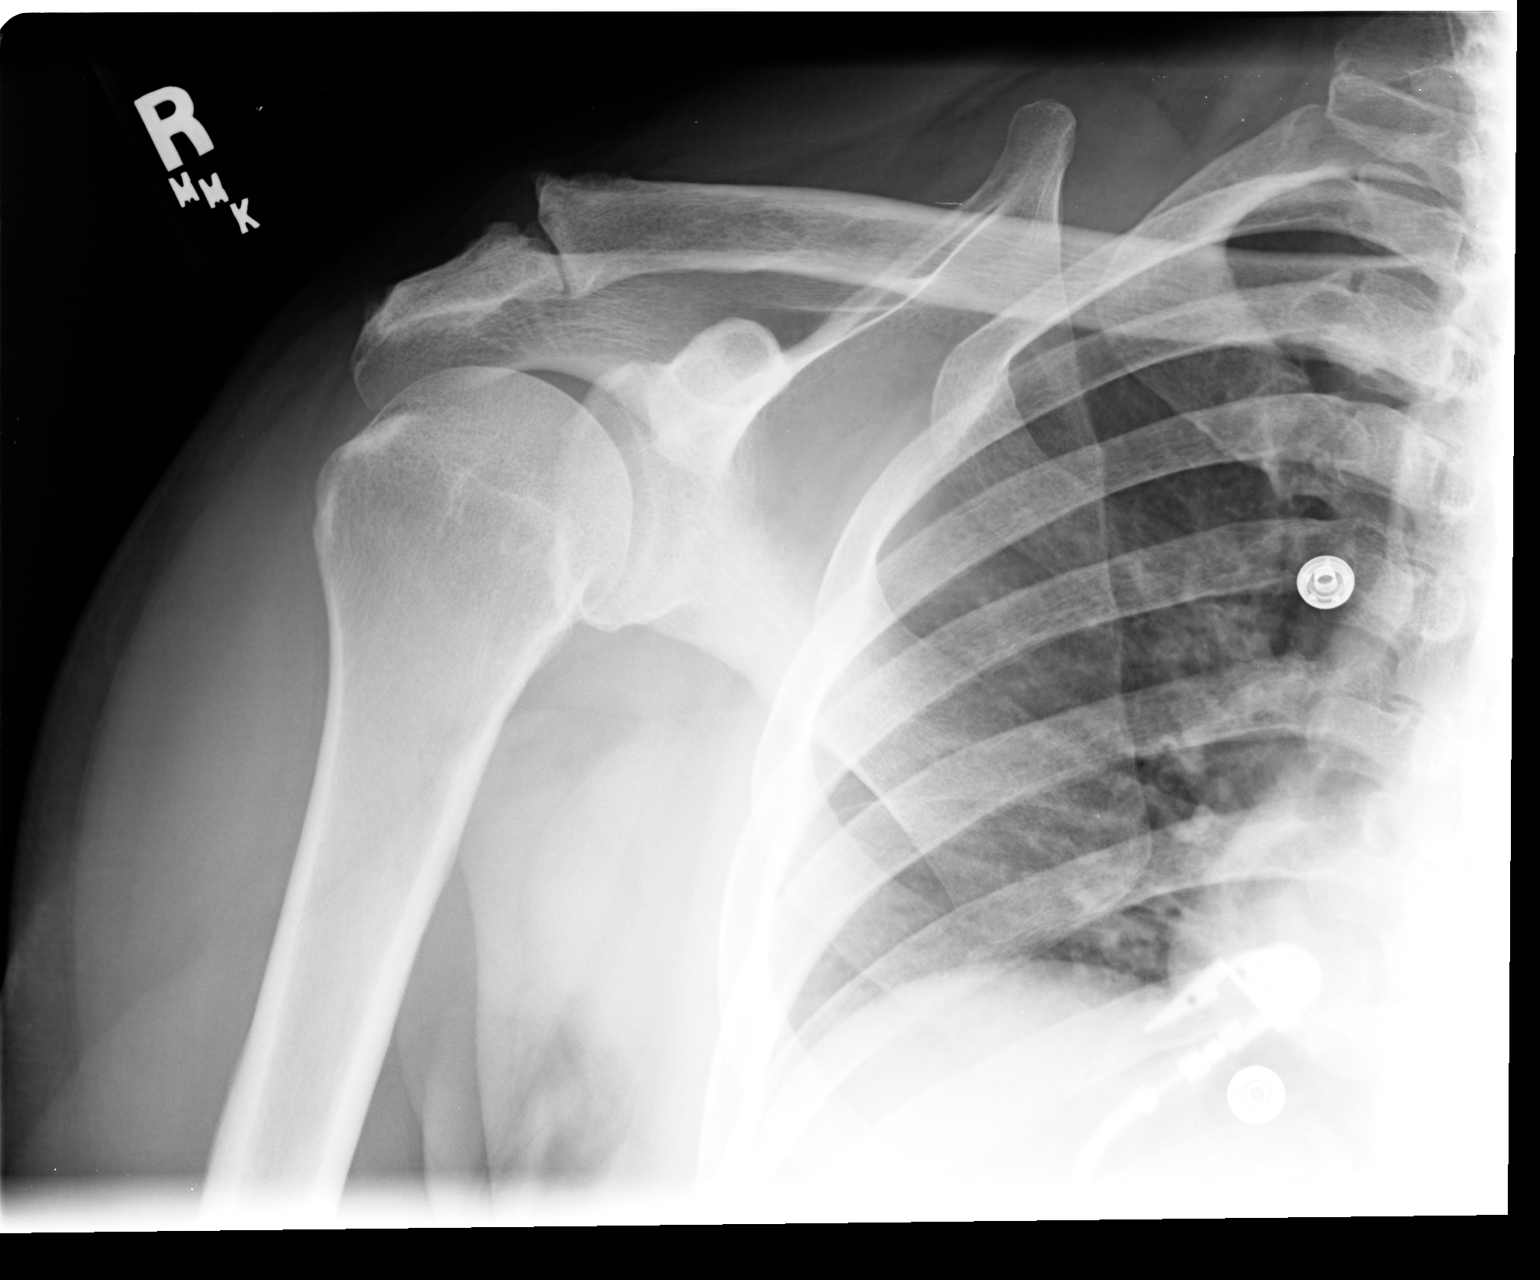

[view not recorded (3 of 3)]
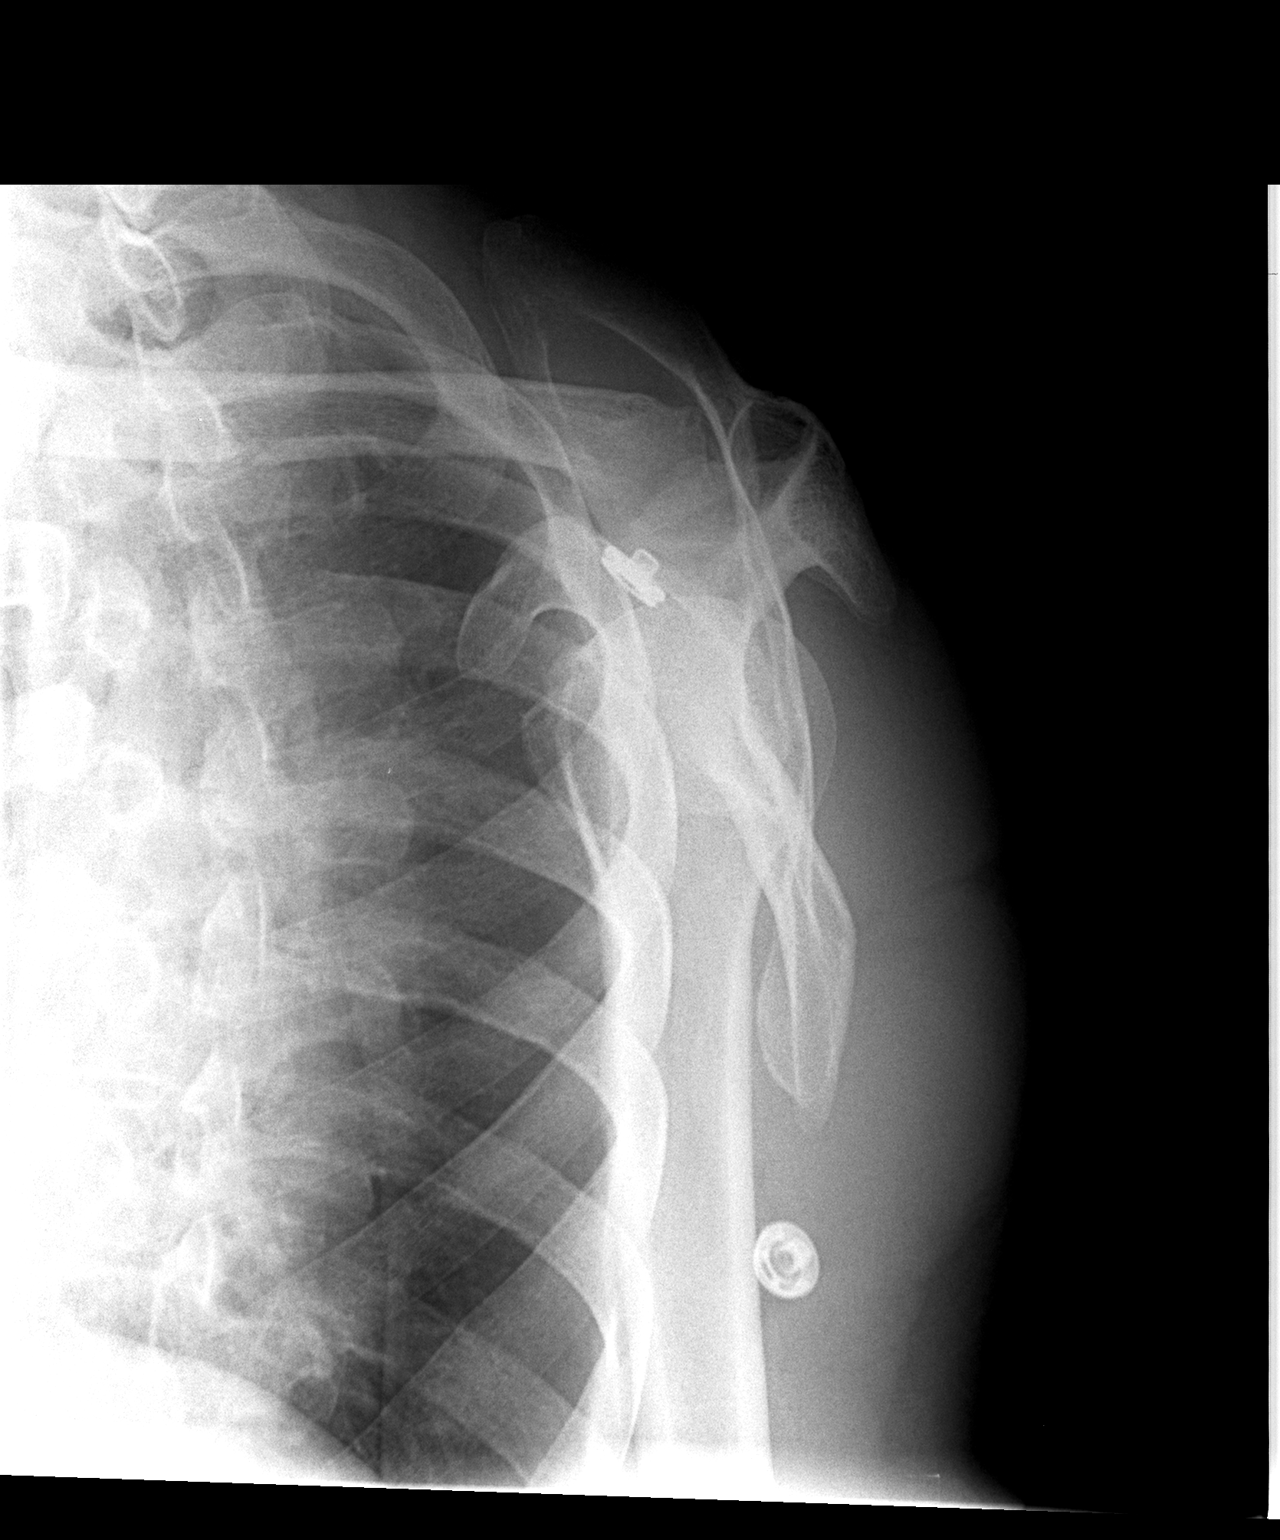

[3 of 3 positions shown; findings below may reference images not displayed]

FINDINGS: Right acromioclavicular joint degenerative change identified.  No fracture or dislocation is seen involving the humeral head.  Right lung apex is clear.
IMPRESSION: No acute finding.  Right AC joint degenerative change.

## 2008-07-31 IMAGING — CR DG CHEST 1V PORT
1 series · 1 of 1 positions shown · non-contrast
Comparison: 05/17/07.

CLINICAL DATA: Chest pain.  
 PORTABLE CHEST - 1 VIEW:

[AP]
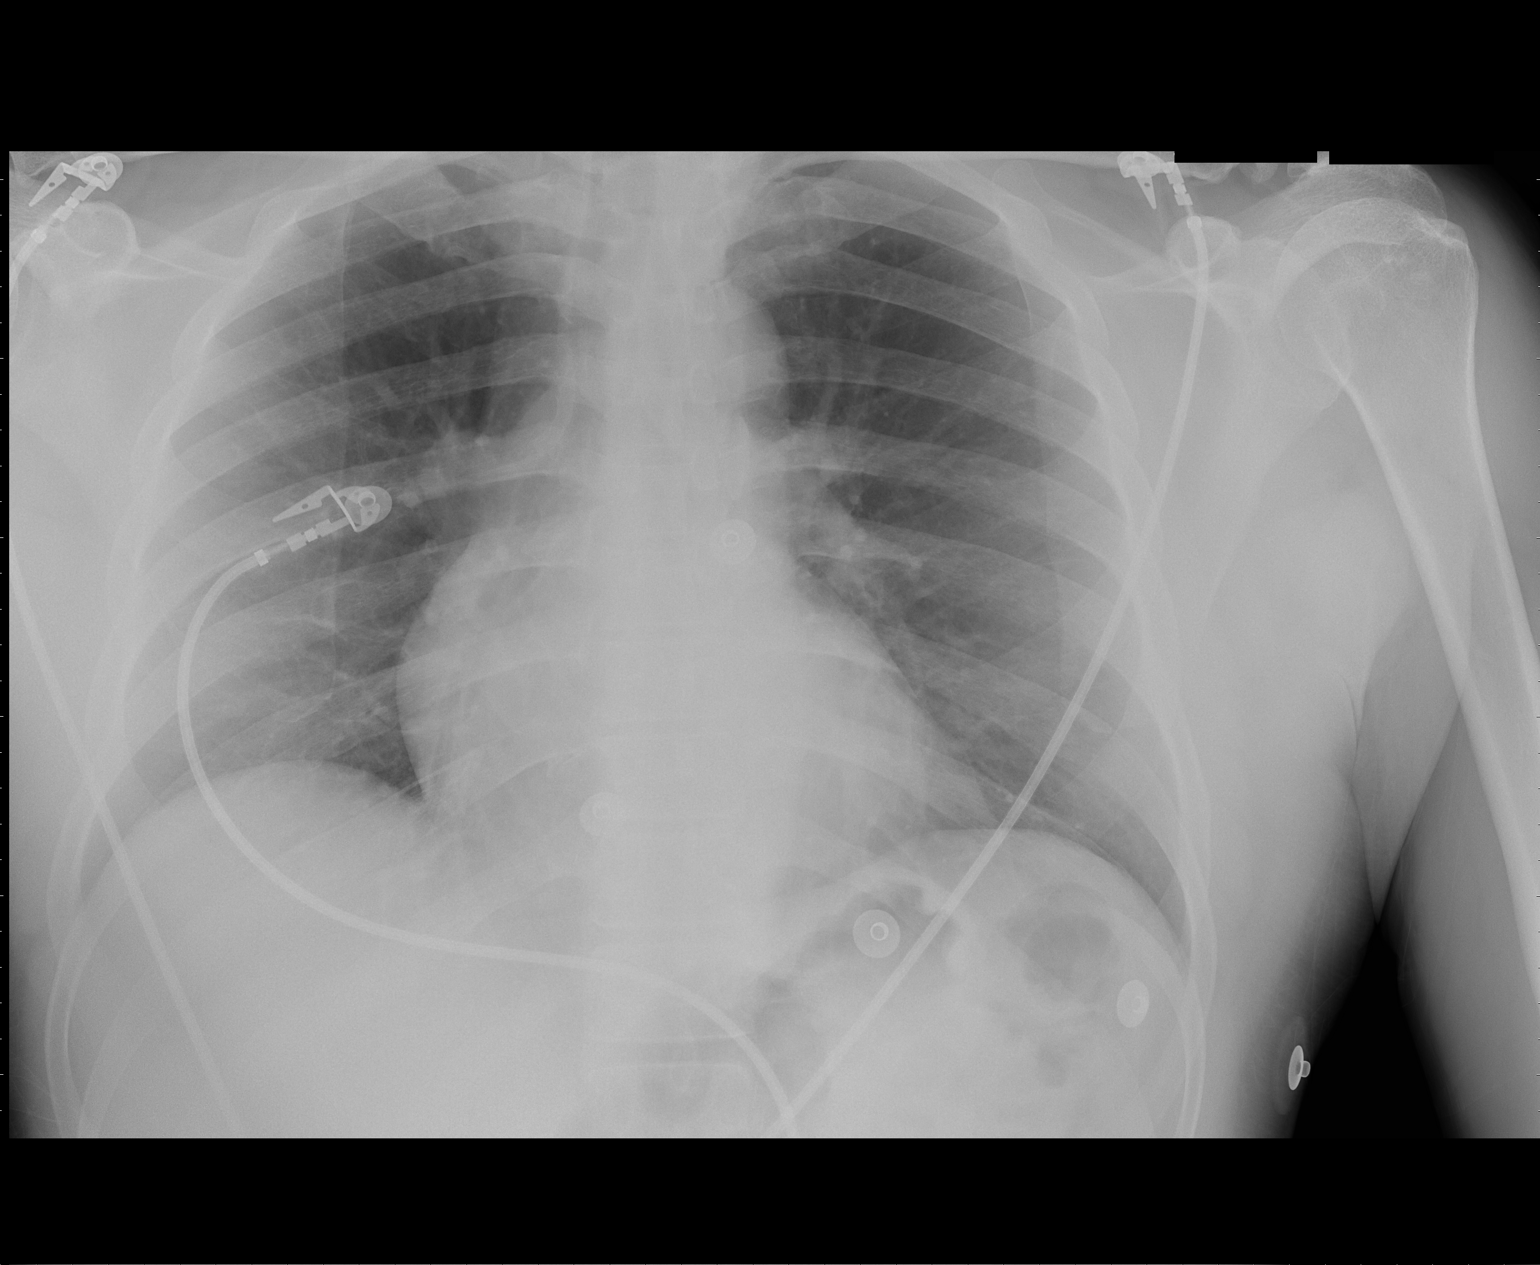

[1 of 1 positions shown; findings below may reference images not displayed]

FINDINGS: Cardiac leads overlie the chest.  Heart size is normal.  Lungs volumes are low with bibasilar atelectasis.  No effusion.
IMPRESSION: Low volumes with bibasilar atelectasis.

## 2008-08-31 ENCOUNTER — Emergency Department (HOSPITAL_COMMUNITY): Admission: EM | Admit: 2008-08-31 | Discharge: 2008-08-31 | Payer: Self-pay | Admitting: Emergency Medicine

## 2008-10-07 ENCOUNTER — Ambulatory Visit: Payer: Self-pay | Admitting: Cardiovascular Disease

## 2008-10-19 IMAGING — CR DG LUMBAR SPINE COMPLETE 4+V
5 series · 5 of 5 positions shown · non-contrast
Comparison: 01/16/2008.

CLINICAL DATA: Fall and pain.

LUMBAR SPINE - COMPLETE 4+ VIEW

[t l-spine a.p.]
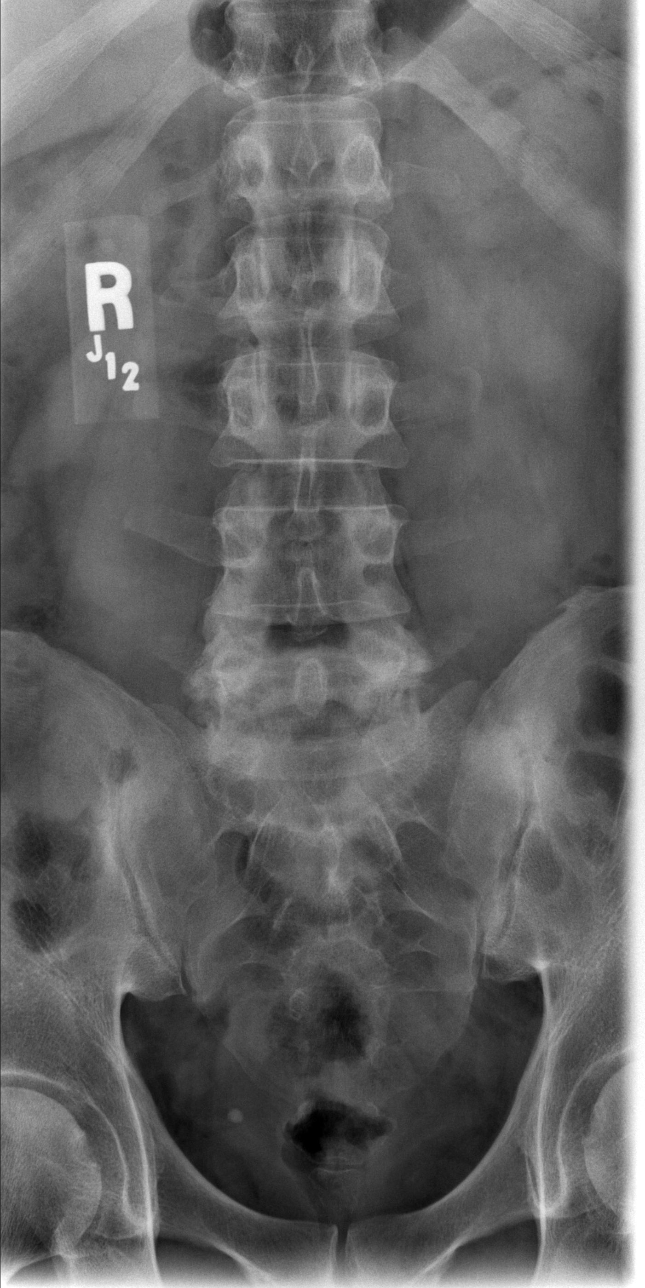

[t l-spine oblique exposure (1 of 2)]
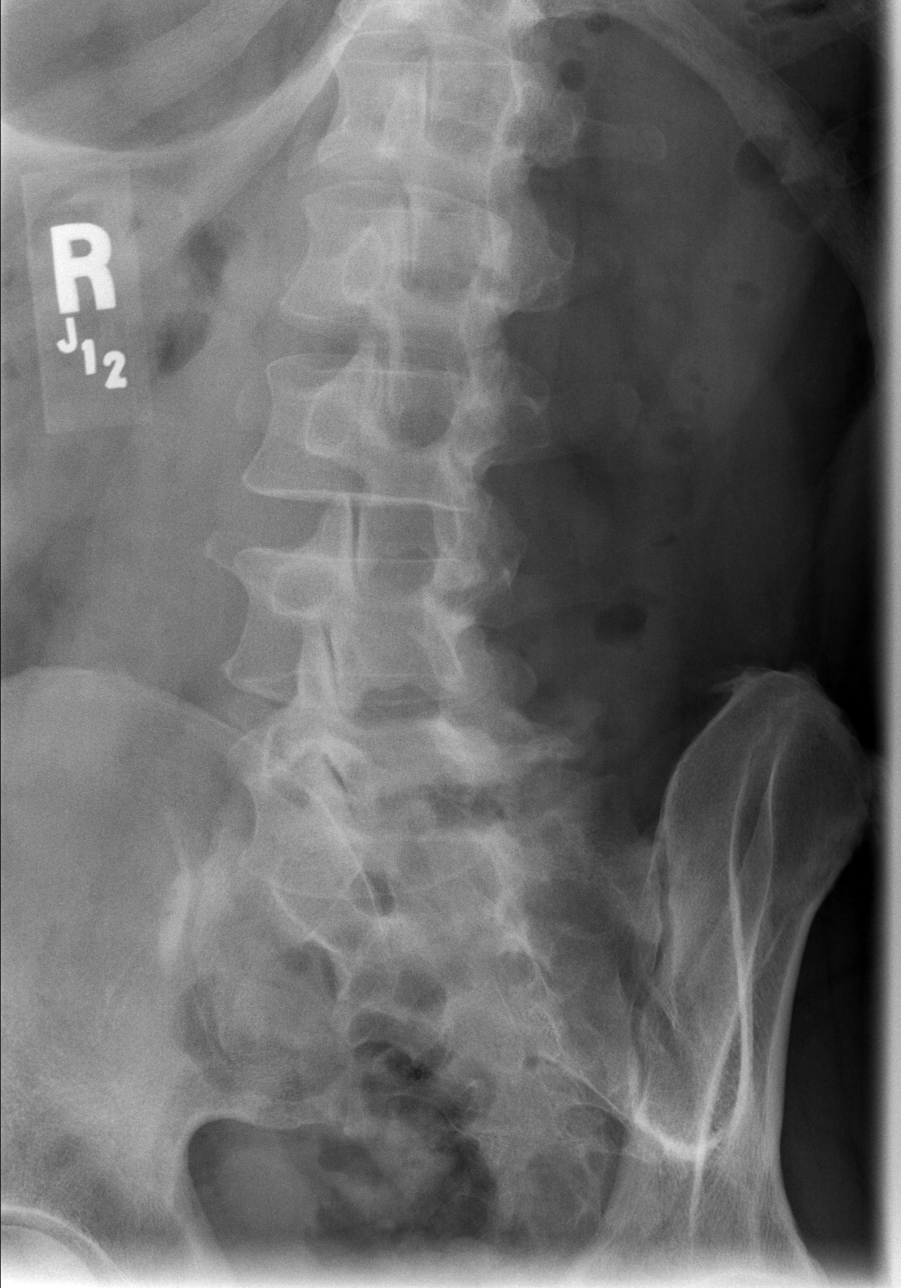

[t l-spine oblique exposure (2 of 2)]
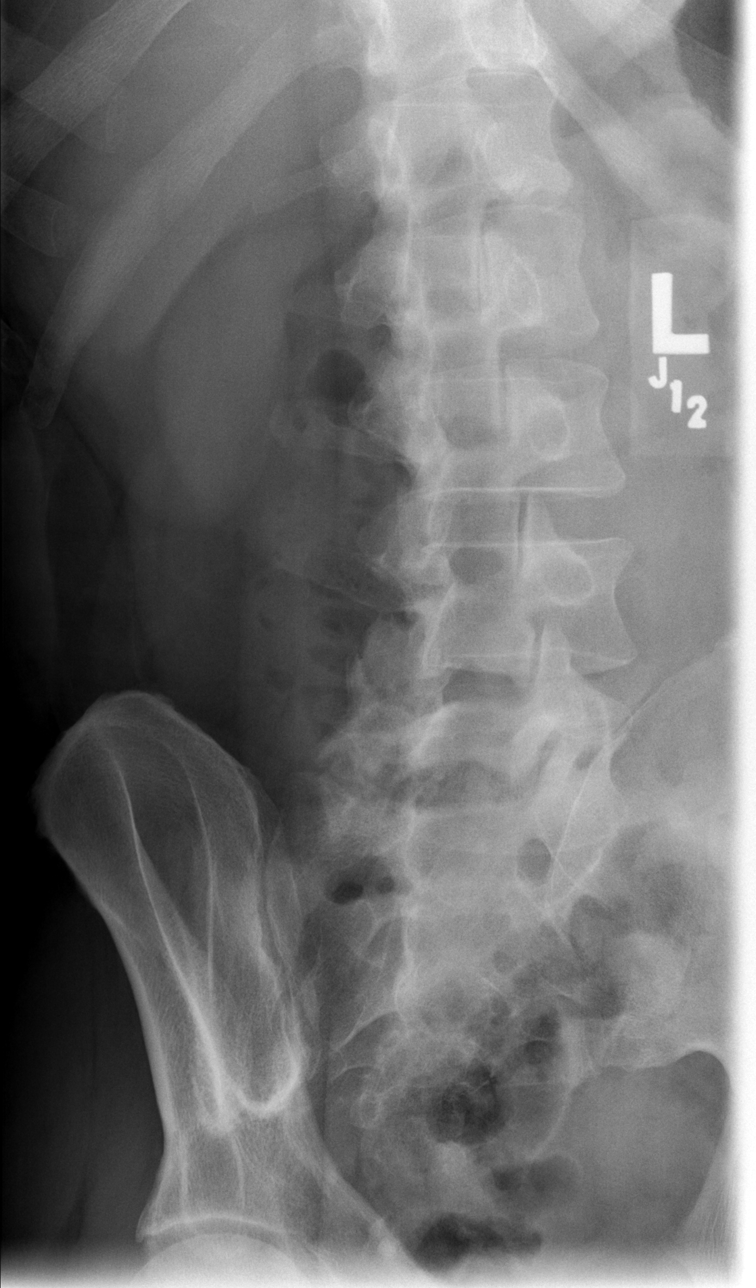

[t l-spine lat]
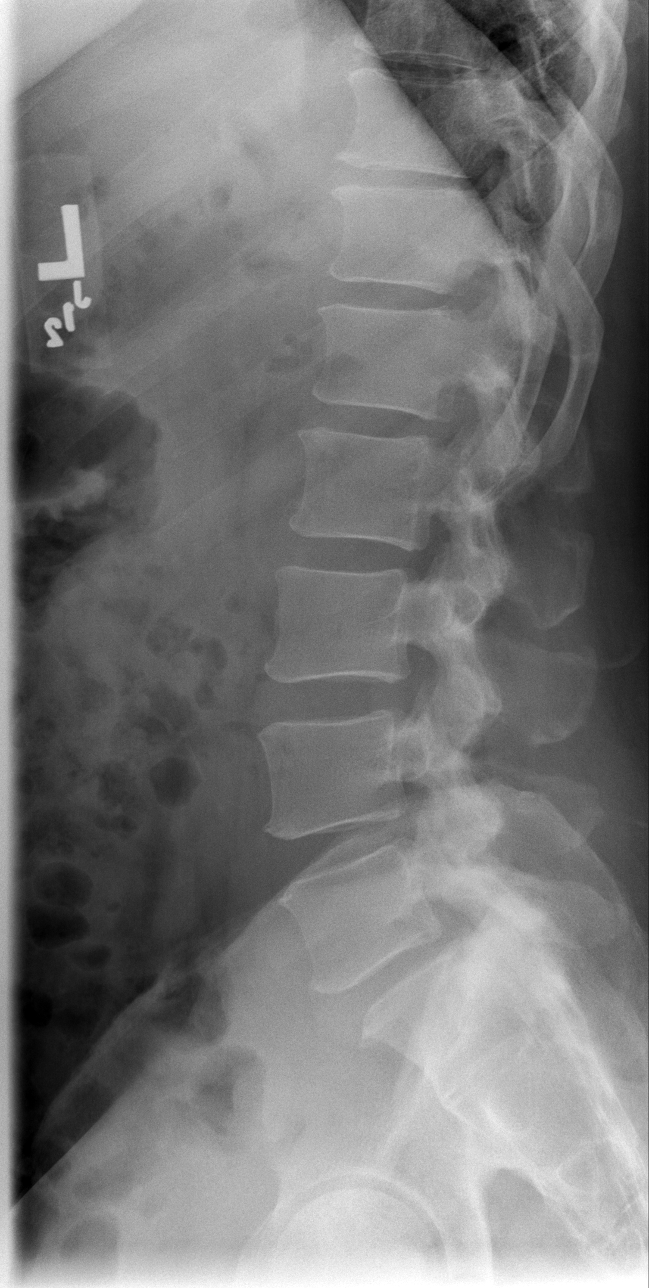

[t l-spine l5-s1 spot]
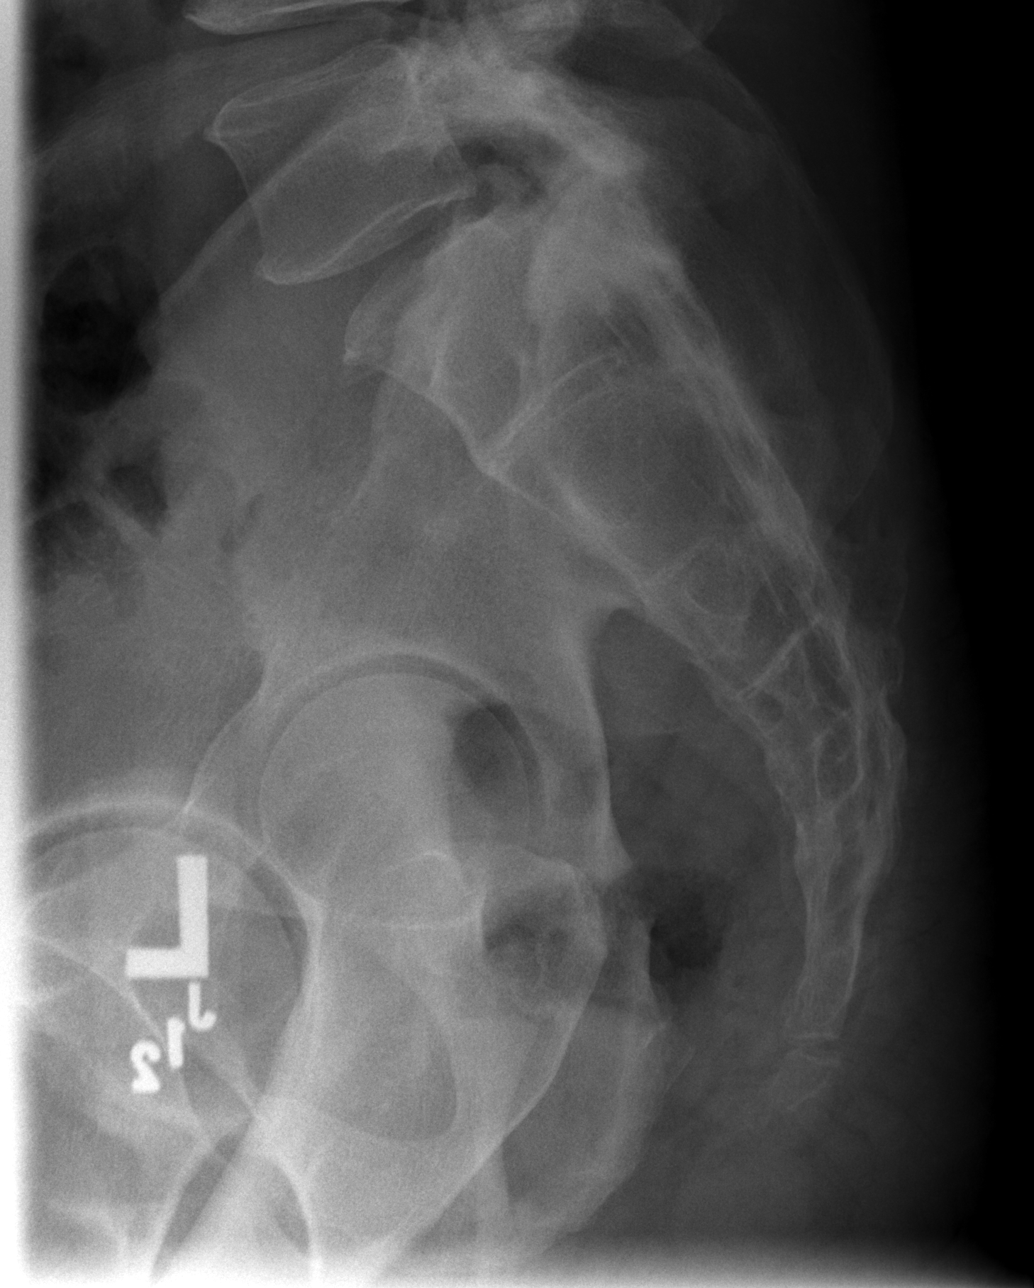

[5 of 5 positions shown; findings below may reference images not displayed]

FINDINGS: AP, lateral and oblique images of the lumbar spine were
obtained.  Normal alignment of the thoracolumbar junction.
Degenerative facet changes at the lumbosacral junction.  Mild
anterolithesis at L5-S1,  likely degenerative in nature.  The
vertebral body heights are maintained.
IMPRESSION: No acute bone abnormalities to the lumbar spine.

Mild anterolisthesis at L5-S1, probably degenerative in nature.

## 2008-10-19 IMAGING — CR DG THORACIC SPINE 2V
3 series · 3 of 3 positions shown · non-contrast
Comparison: Cervical spine study from 01/16/2008

CLINICAL DATA: Fall and pain

THORACIC SPINE - 2 VIEW

[t t-spine a.p.]
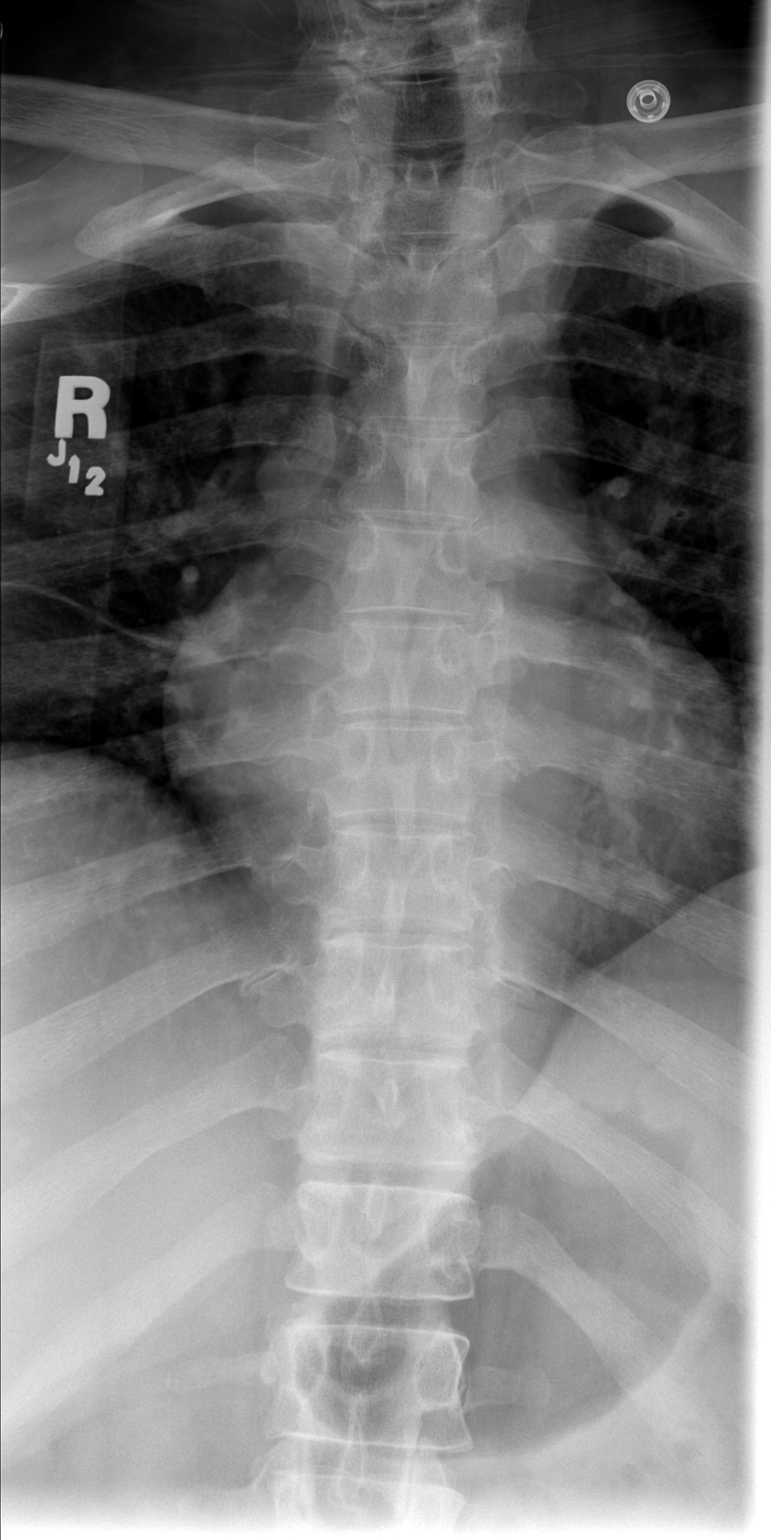

[t t-spine lat]
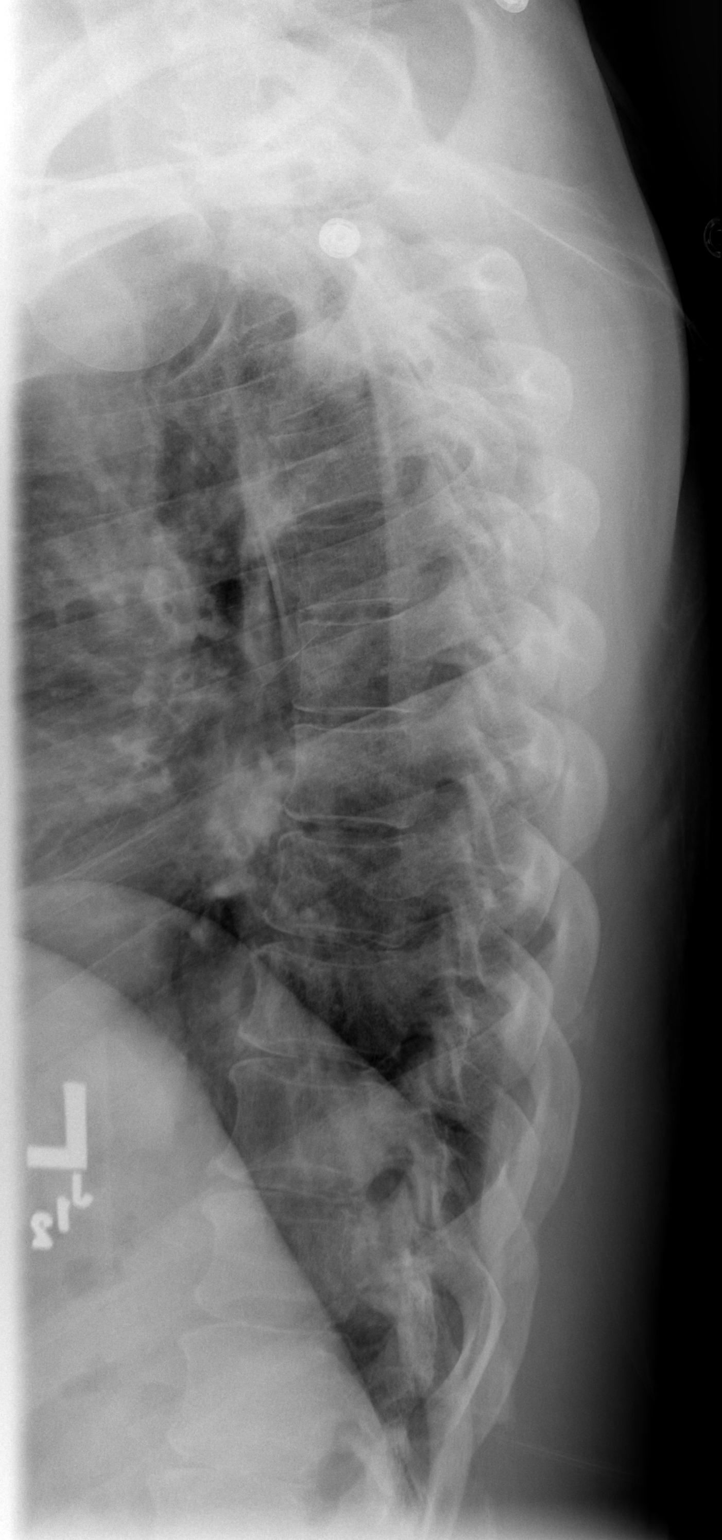

[t swimmers *]
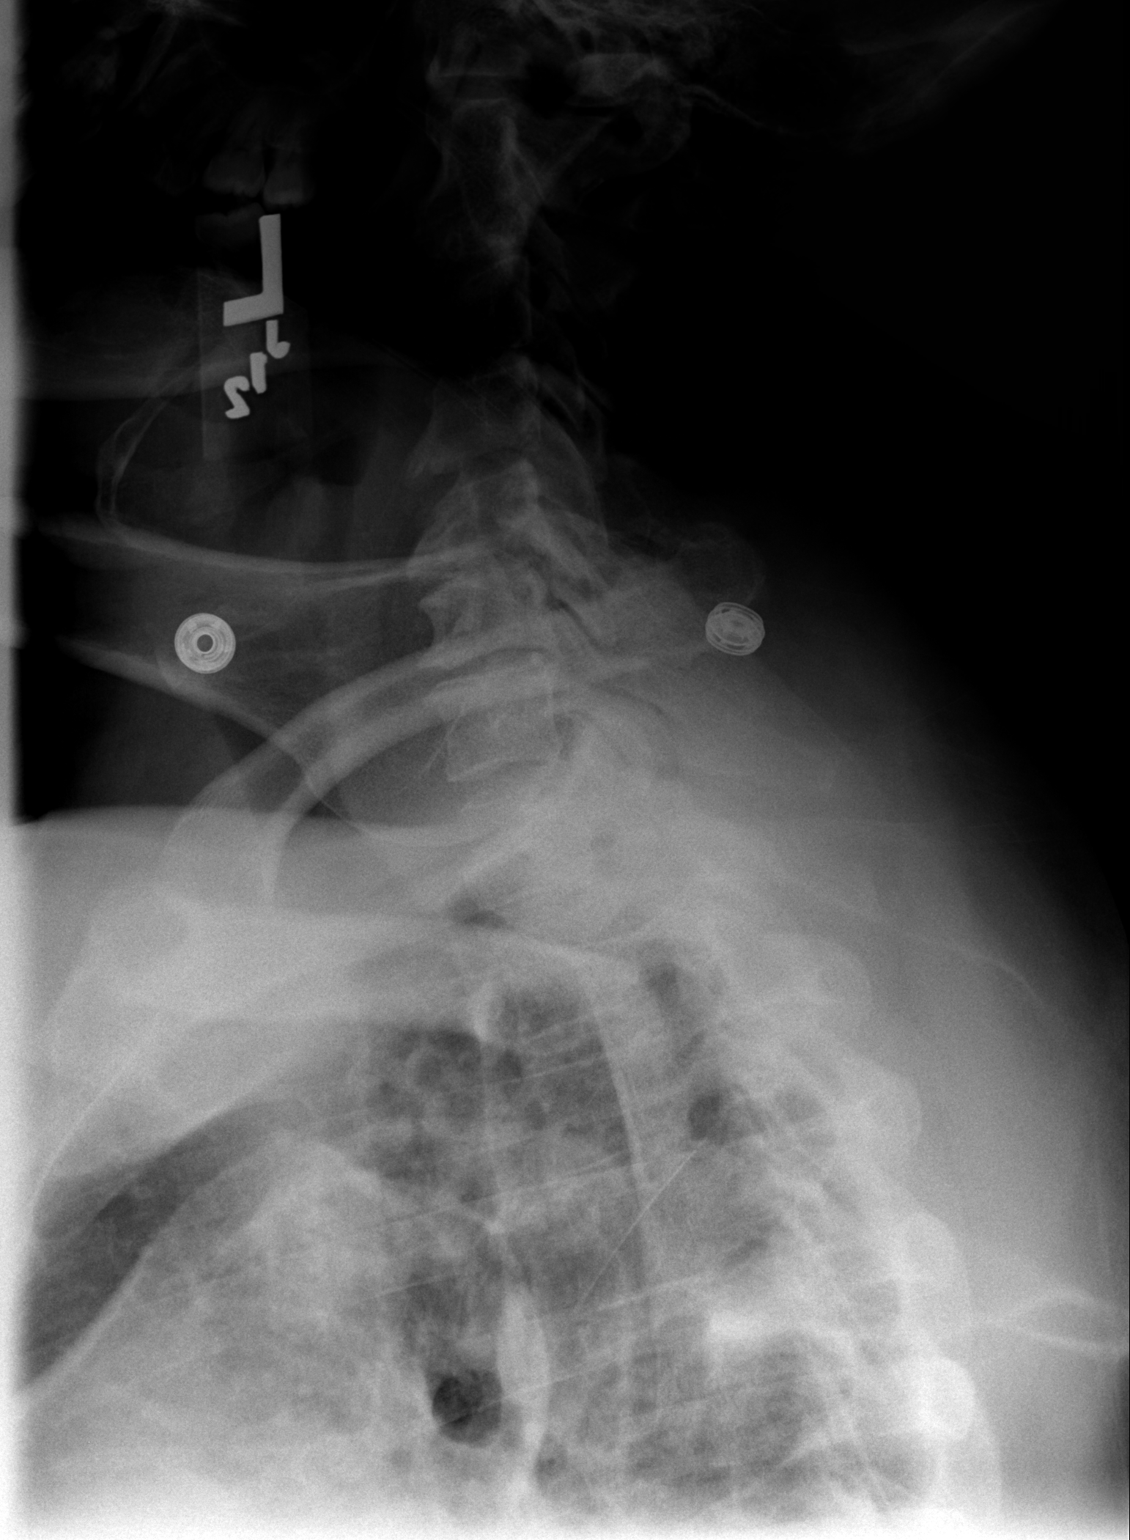

[3 of 3 positions shown; findings below may reference images not displayed]

FINDINGS: AP, lateral and swimmers view were obtained.  Normal
alignment of the thoracic spine.  Normal alignment of the cervical-
thoracic junction.  No evidence for acute fracture or dislocation.
IMPRESSION: No acute bone abnormalities to the thoracic spine.

## 2008-10-22 HISTORY — PX: SHOULDER OPEN ROTATOR CUFF REPAIR: SHX2407

## 2009-03-12 ENCOUNTER — Emergency Department (HOSPITAL_COMMUNITY): Admission: EM | Admit: 2009-03-12 | Discharge: 2009-03-12 | Payer: Self-pay | Admitting: Emergency Medicine

## 2009-04-11 ENCOUNTER — Ambulatory Visit: Payer: Self-pay | Admitting: Internal Medicine

## 2009-04-20 ENCOUNTER — Ambulatory Visit: Payer: Self-pay | Admitting: Internal Medicine

## 2009-05-18 DIAGNOSIS — I10 Essential (primary) hypertension: Secondary | ICD-10-CM | POA: Insufficient documentation

## 2009-05-18 DIAGNOSIS — I441 Atrioventricular block, second degree: Secondary | ICD-10-CM | POA: Insufficient documentation

## 2009-05-18 DIAGNOSIS — M62838 Other muscle spasm: Secondary | ICD-10-CM | POA: Insufficient documentation

## 2009-05-18 DIAGNOSIS — I219 Acute myocardial infarction, unspecified: Secondary | ICD-10-CM

## 2009-05-18 HISTORY — DX: Essential (primary) hypertension: I10

## 2009-05-25 ENCOUNTER — Emergency Department (HOSPITAL_COMMUNITY): Admission: EM | Admit: 2009-05-25 | Discharge: 2009-05-25 | Payer: Self-pay | Admitting: Emergency Medicine

## 2009-06-03 ENCOUNTER — Encounter (INDEPENDENT_AMBULATORY_CARE_PROVIDER_SITE_OTHER): Payer: Self-pay | Admitting: *Deleted

## 2009-06-04 IMAGING — CR DG CHEST 1V PORT
1 series · 1 of 1 positions shown · non-contrast
Comparison: Portable chest x-ray of 10/28/2007

CLINICAL DATA: Chest pain, arm pain, shortness of breath

PORTABLE CHEST - 1 VIEW

[view not recorded]
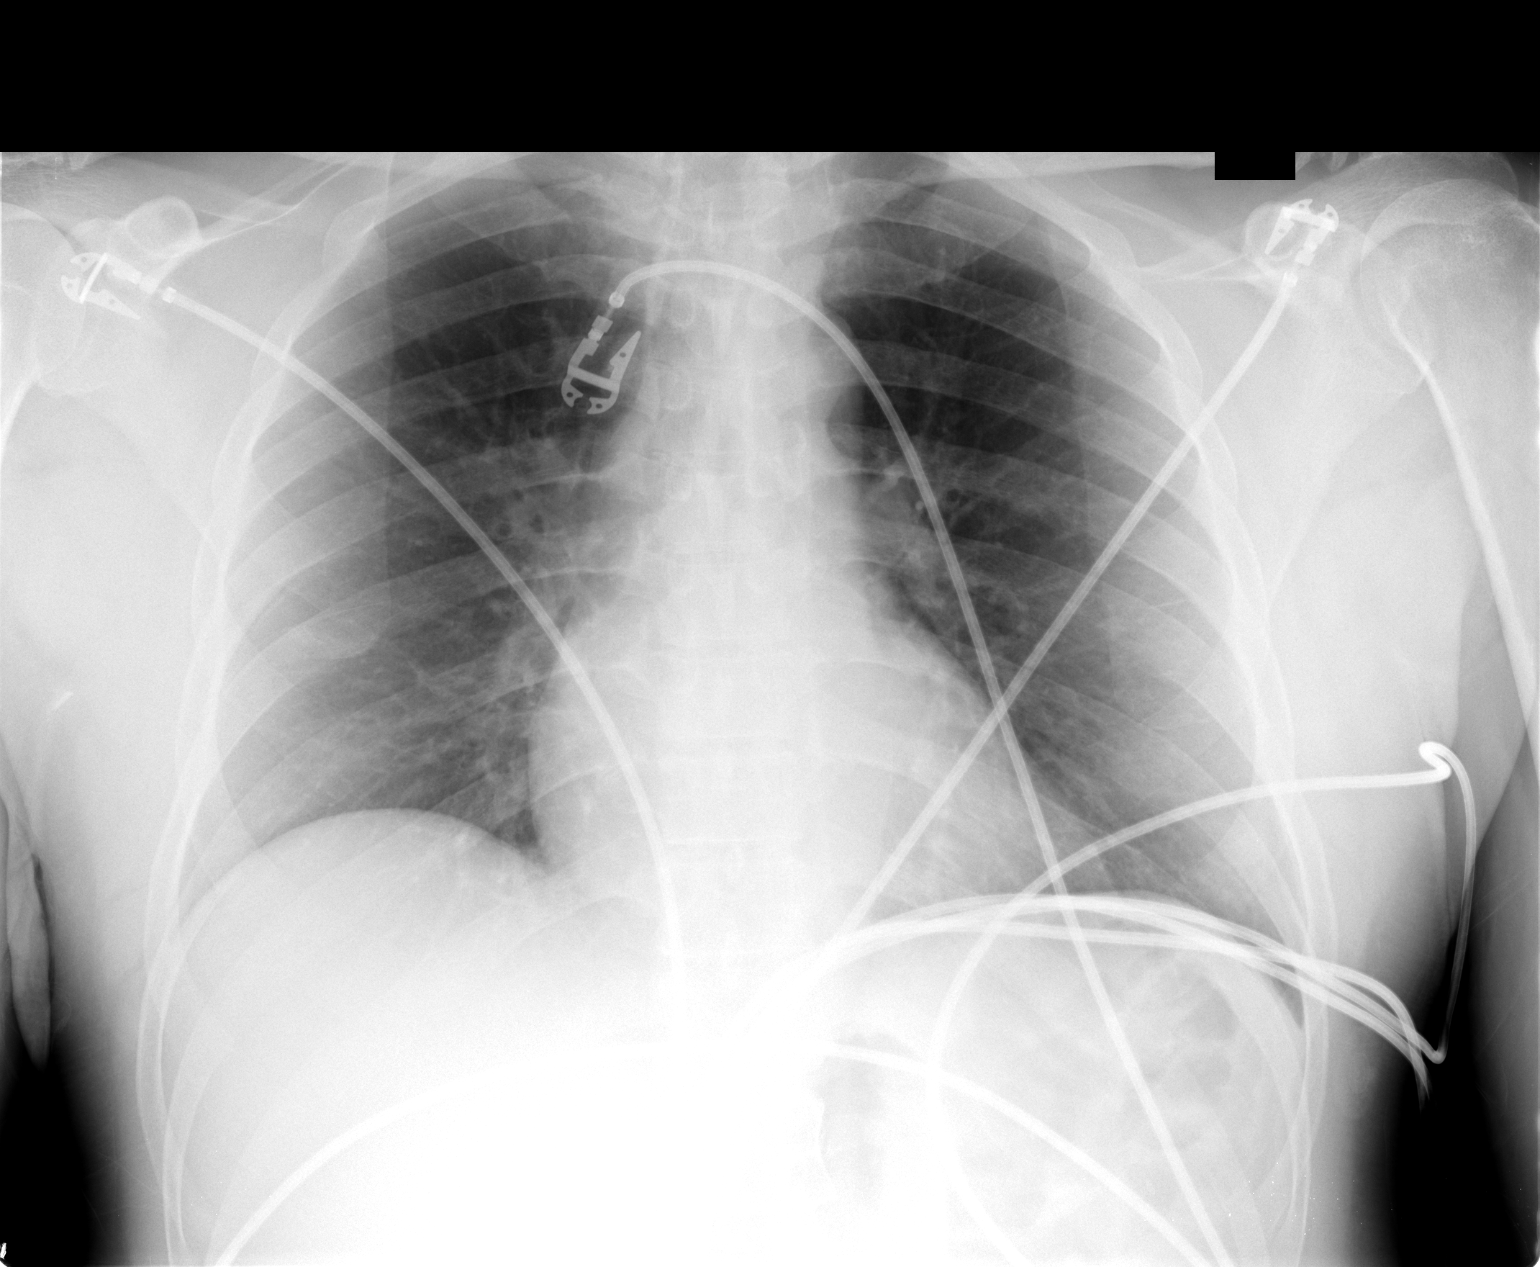

[1 of 1 positions shown; findings below may reference images not displayed]

FINDINGS: The lungs are clear. The heart is within normal limits in
size. No bony abnormality is seen.
IMPRESSION: No active lung disease.

## 2009-06-23 ENCOUNTER — Ambulatory Visit (HOSPITAL_COMMUNITY): Admission: RE | Admit: 2009-06-23 | Discharge: 2009-06-23 | Payer: Self-pay | Admitting: Orthopedic Surgery

## 2009-06-27 ENCOUNTER — Emergency Department (HOSPITAL_COMMUNITY): Admission: EM | Admit: 2009-06-27 | Discharge: 2009-06-28 | Payer: Self-pay | Admitting: Emergency Medicine

## 2009-07-01 ENCOUNTER — Ambulatory Visit: Payer: Self-pay | Admitting: Internal Medicine

## 2009-07-08 ENCOUNTER — Ambulatory Visit: Payer: Self-pay | Admitting: Internal Medicine

## 2009-07-14 ENCOUNTER — Encounter (INDEPENDENT_AMBULATORY_CARE_PROVIDER_SITE_OTHER): Payer: Self-pay | Admitting: Internal Medicine

## 2009-07-14 ENCOUNTER — Encounter: Payer: Self-pay | Admitting: Emergency Medicine

## 2009-07-14 ENCOUNTER — Ambulatory Visit: Payer: Self-pay | Admitting: Cardiology

## 2009-07-14 ENCOUNTER — Inpatient Hospital Stay (HOSPITAL_COMMUNITY): Admission: AD | Admit: 2009-07-14 | Discharge: 2009-07-17 | Payer: Self-pay | Admitting: Internal Medicine

## 2009-07-17 ENCOUNTER — Inpatient Hospital Stay (HOSPITAL_COMMUNITY): Admission: RE | Admit: 2009-07-17 | Discharge: 2009-07-18 | Payer: Self-pay | Admitting: Psychiatry

## 2009-07-17 ENCOUNTER — Ambulatory Visit: Payer: Self-pay | Admitting: Psychiatry

## 2009-08-08 ENCOUNTER — Telehealth (INDEPENDENT_AMBULATORY_CARE_PROVIDER_SITE_OTHER): Payer: Self-pay | Admitting: *Deleted

## 2009-08-17 ENCOUNTER — Emergency Department (HOSPITAL_COMMUNITY): Admission: EM | Admit: 2009-08-17 | Discharge: 2009-08-17 | Payer: Self-pay | Admitting: Emergency Medicine

## 2009-08-18 ENCOUNTER — Ambulatory Visit: Payer: Self-pay | Admitting: Internal Medicine

## 2009-09-02 ENCOUNTER — Telehealth (INDEPENDENT_AMBULATORY_CARE_PROVIDER_SITE_OTHER): Payer: Self-pay | Admitting: *Deleted

## 2009-09-06 ENCOUNTER — Encounter: Admission: RE | Admit: 2009-09-06 | Discharge: 2009-10-04 | Payer: Self-pay | Admitting: Family Medicine

## 2009-10-07 ENCOUNTER — Encounter (INDEPENDENT_AMBULATORY_CARE_PROVIDER_SITE_OTHER): Payer: Self-pay | Admitting: *Deleted

## 2009-10-07 ENCOUNTER — Ambulatory Visit: Payer: Self-pay | Admitting: Cardiovascular Disease

## 2009-10-18 ENCOUNTER — Ambulatory Visit: Payer: Self-pay | Admitting: Internal Medicine

## 2009-10-18 LAB — CONVERTED CEMR LAB
ALT: 23 U/L
AST: 22 U/L
Albumin: 4.6 g/dL
Alkaline Phosphatase: 36 U/L — ABNORMAL LOW
BUN: 18 mg/dL
CO2: 19 meq/L
Calcium: 9.6 mg/dL
Chloride: 105 meq/L
Creatinine, Ser: 1 mg/dL
Glucose, Bld: 98 mg/dL
Hgb A1c MFr Bld: 6 %
Potassium: 4.1 meq/L
Sodium: 138 meq/L
Total Bilirubin: 0.4 mg/dL
Total Protein: 7.5 g/dL

## 2009-10-25 ENCOUNTER — Ambulatory Visit: Payer: Self-pay | Admitting: Internal Medicine

## 2009-11-09 ENCOUNTER — Encounter (INDEPENDENT_AMBULATORY_CARE_PROVIDER_SITE_OTHER): Payer: Self-pay | Admitting: Family Medicine

## 2009-11-09 ENCOUNTER — Ambulatory Visit: Payer: Self-pay | Admitting: Internal Medicine

## 2009-11-09 LAB — CONVERTED CEMR LAB
Creatinine,U: 35.1 mg/dL
Methadone: NEGATIVE
Opiate Screen, Urine: NEGATIVE
Phencyclidine (PCP): NEGATIVE
Propoxyphene: NEGATIVE

## 2009-11-29 ENCOUNTER — Ambulatory Visit (HOSPITAL_COMMUNITY): Admission: RE | Admit: 2009-11-29 | Discharge: 2009-11-30 | Payer: Self-pay | Admitting: Orthopedic Surgery

## 2010-01-18 ENCOUNTER — Encounter: Admission: RE | Admit: 2010-01-18 | Discharge: 2010-03-15 | Payer: Self-pay | Admitting: Orthopedic Surgery

## 2010-01-24 ENCOUNTER — Ambulatory Visit: Payer: Self-pay | Admitting: Family Medicine

## 2010-01-27 ENCOUNTER — Ambulatory Visit: Payer: Self-pay | Admitting: Internal Medicine

## 2010-02-26 IMAGING — CR DG WRIST COMPLETE 3+V*L*
4 series · 4 of 4 positions shown · non-contrast
Comparison: 05/17/2007.

CLINICAL DATA: Fall.  Left wrist pain.

LEFT WRIST - COMPLETE 3+ VIEW

[x wrist pa left]
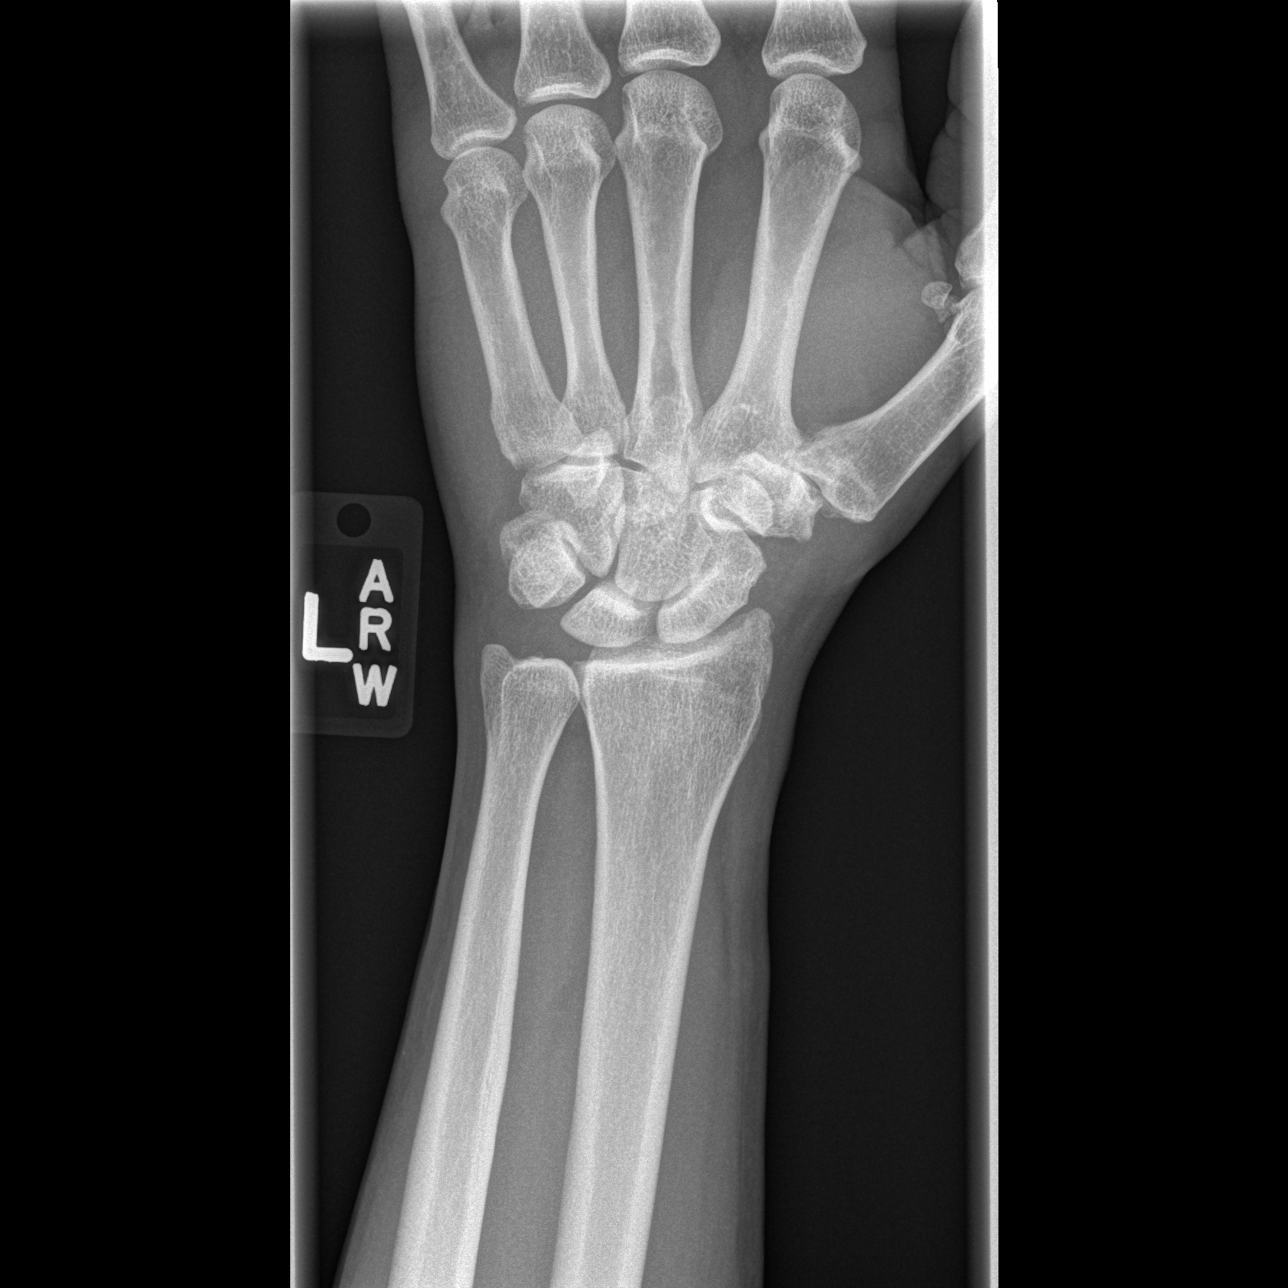

[x wrist obl left]
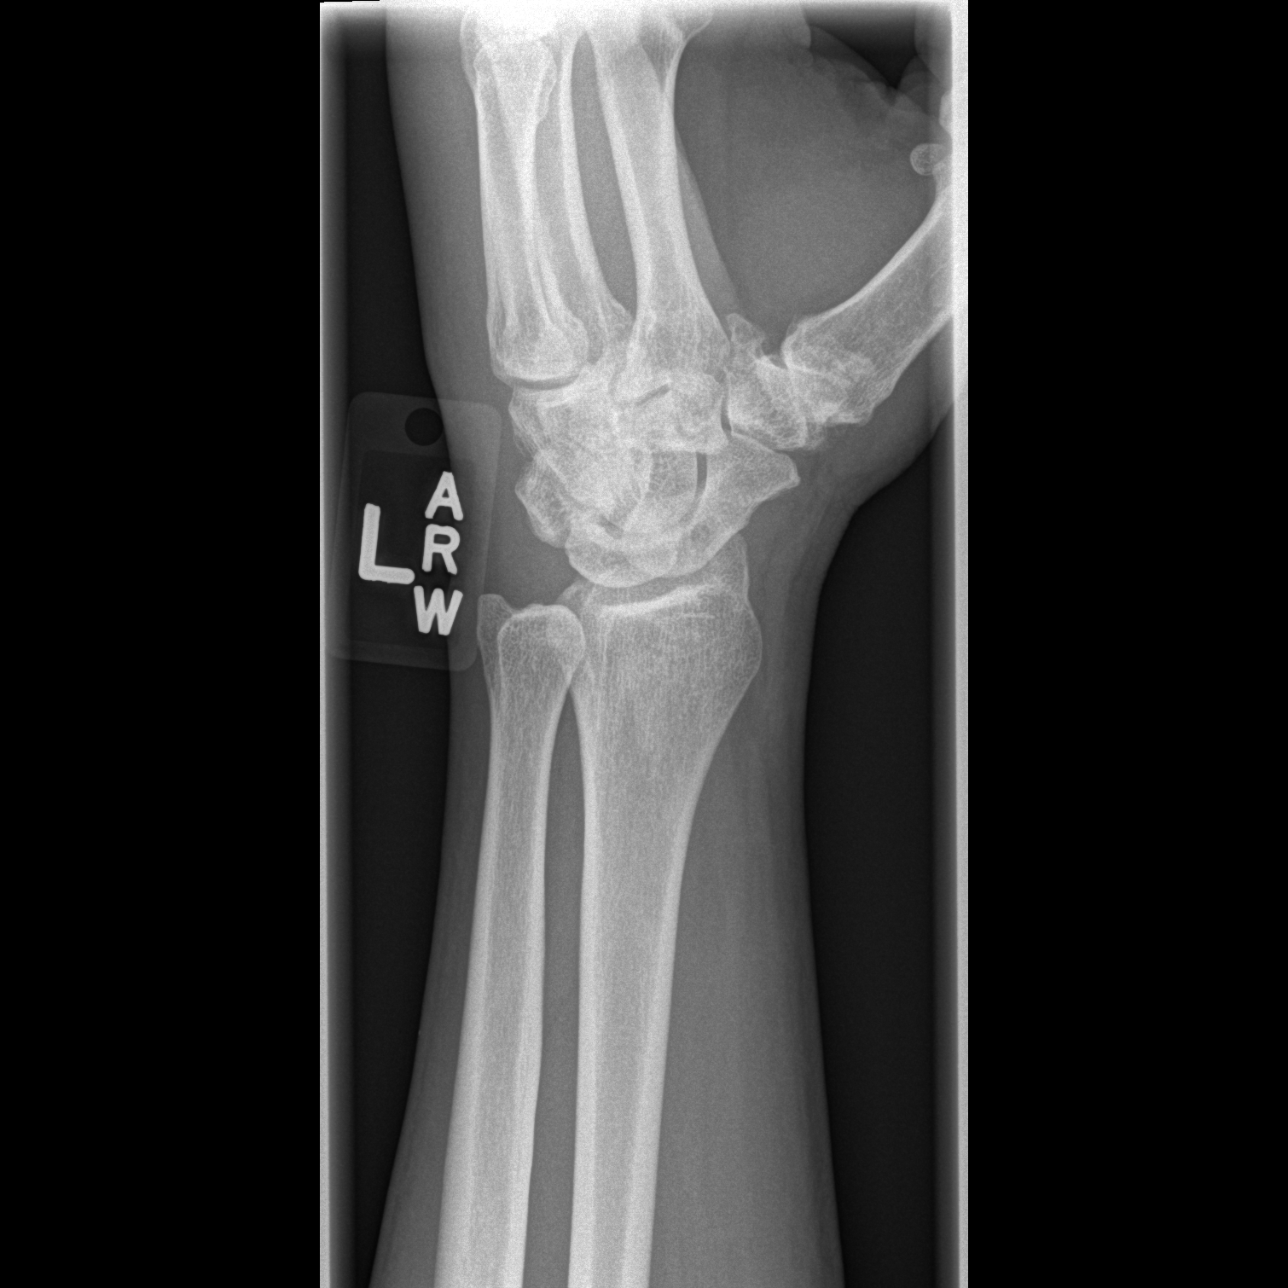

[x wrist lat left]
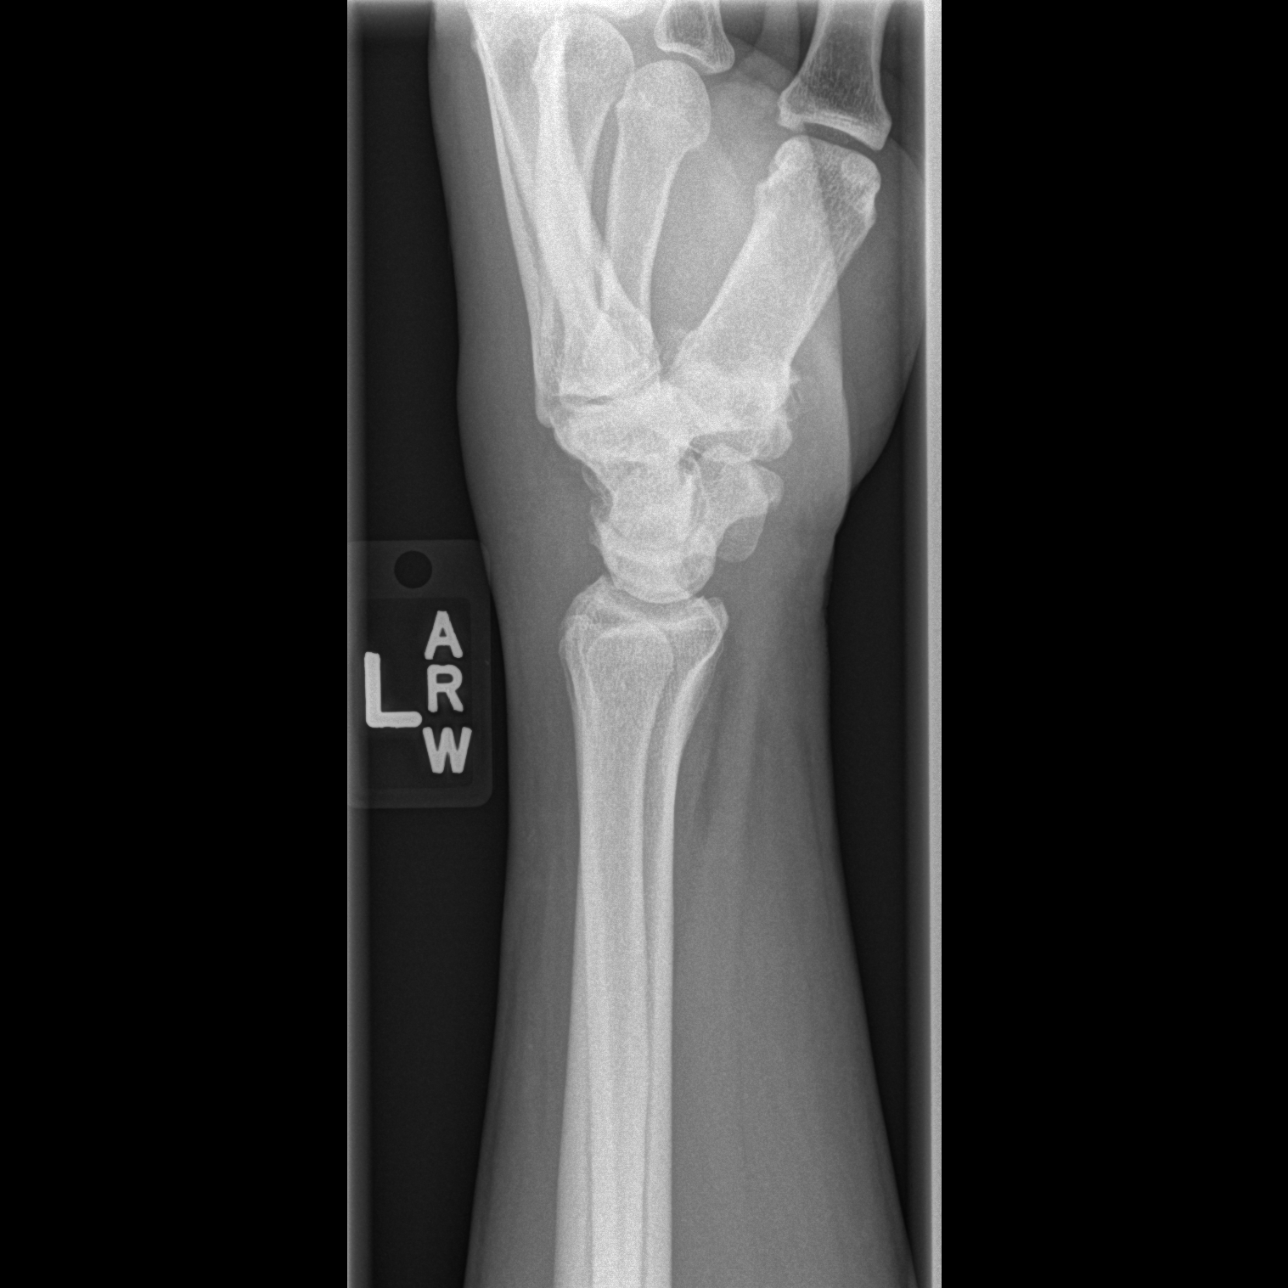

[x wrist navicular]
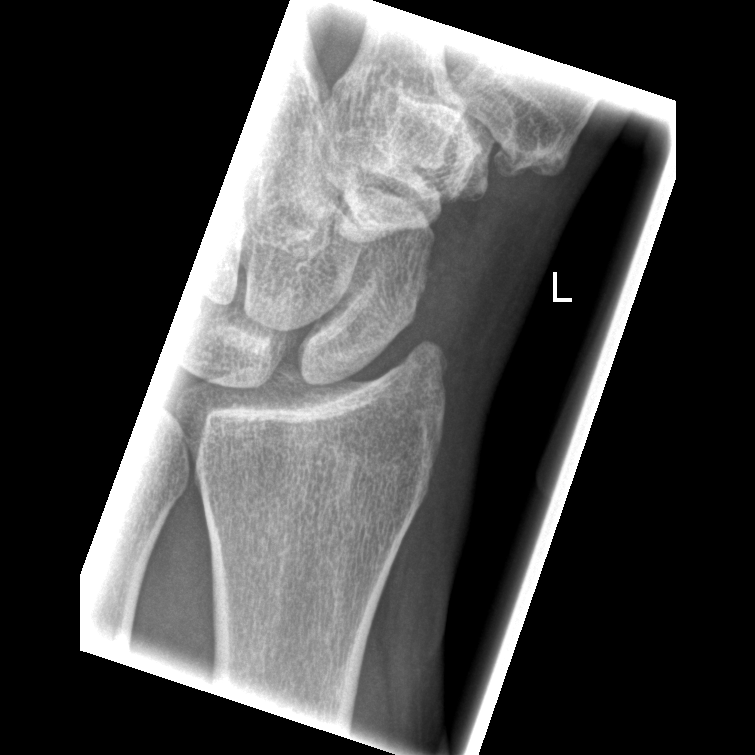

[4 of 4 positions shown; findings below may reference images not displayed]

FINDINGS: No evidence for an acute fracture.  There is no evidence
for subluxation or dislocation.  Degenerative changes at the first
carpal metacarpal joint have progressed in the interval.
IMPRESSION: Degenerative changes at the first CMC joint.  No acute bony
abnormality.

## 2010-02-26 IMAGING — CR DG HAND COMPLETE 3+V*L*
3 series · 3 of 3 positions shown · non-contrast
Comparison: None.

CLINICAL DATA: Trauma.  Fall.  Left hand pain.

LEFT HAND - COMPLETE 3+ VIEW

[x hand ap left]
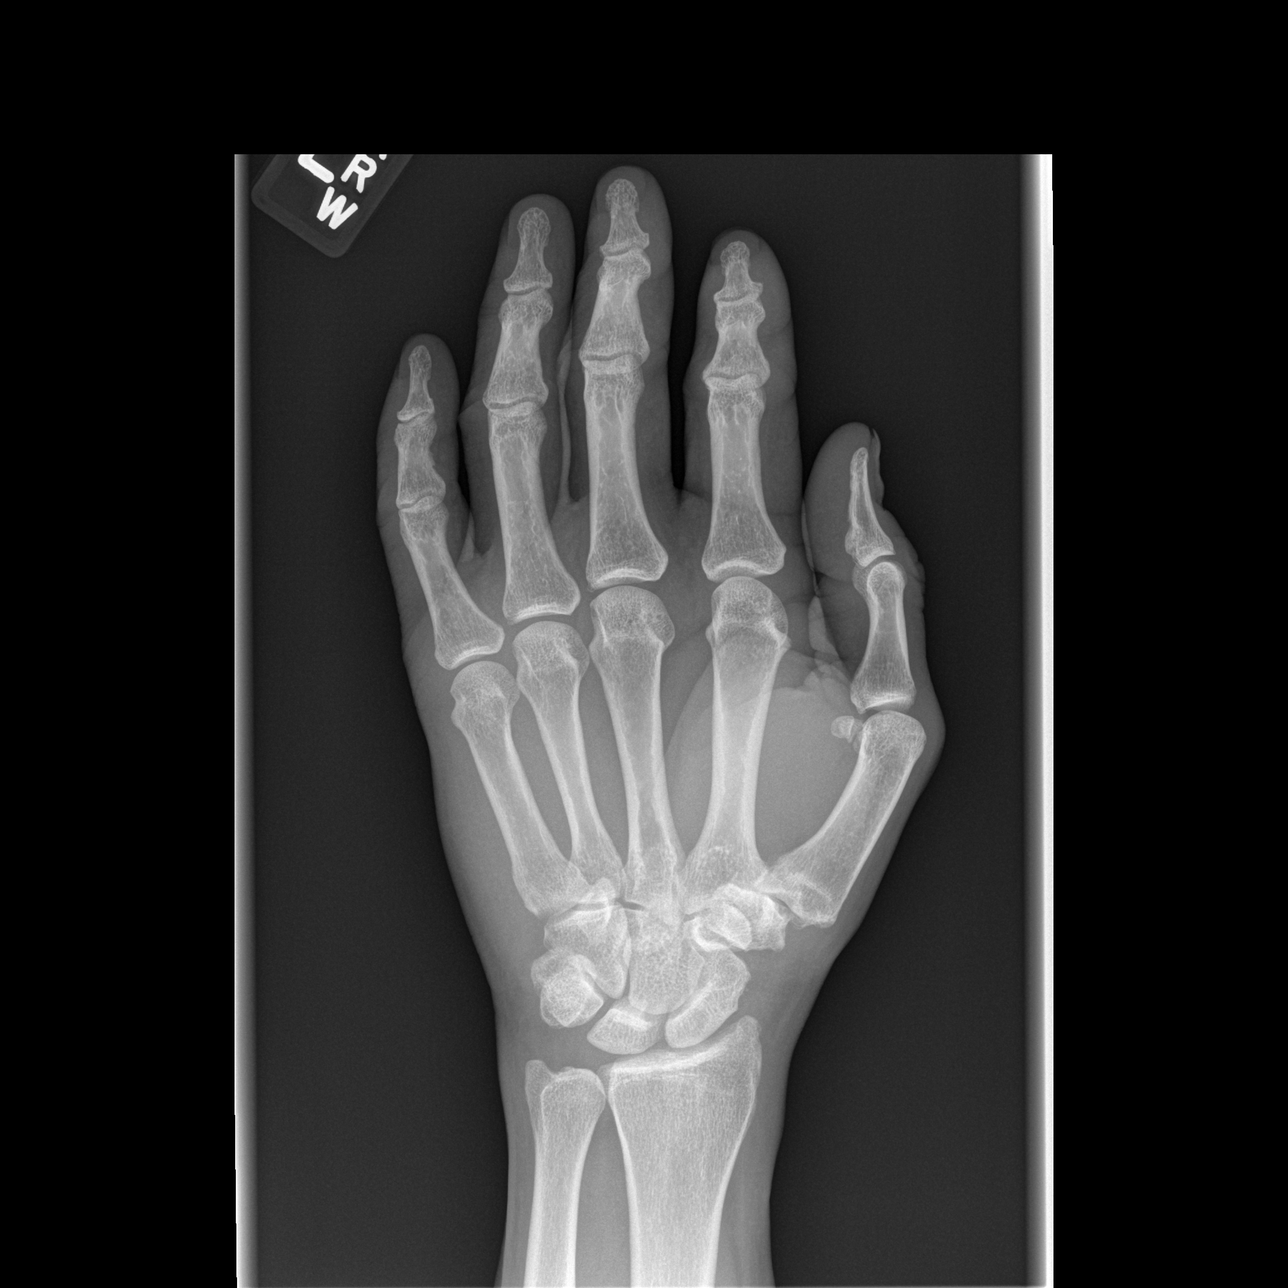

[x hand oblique left]
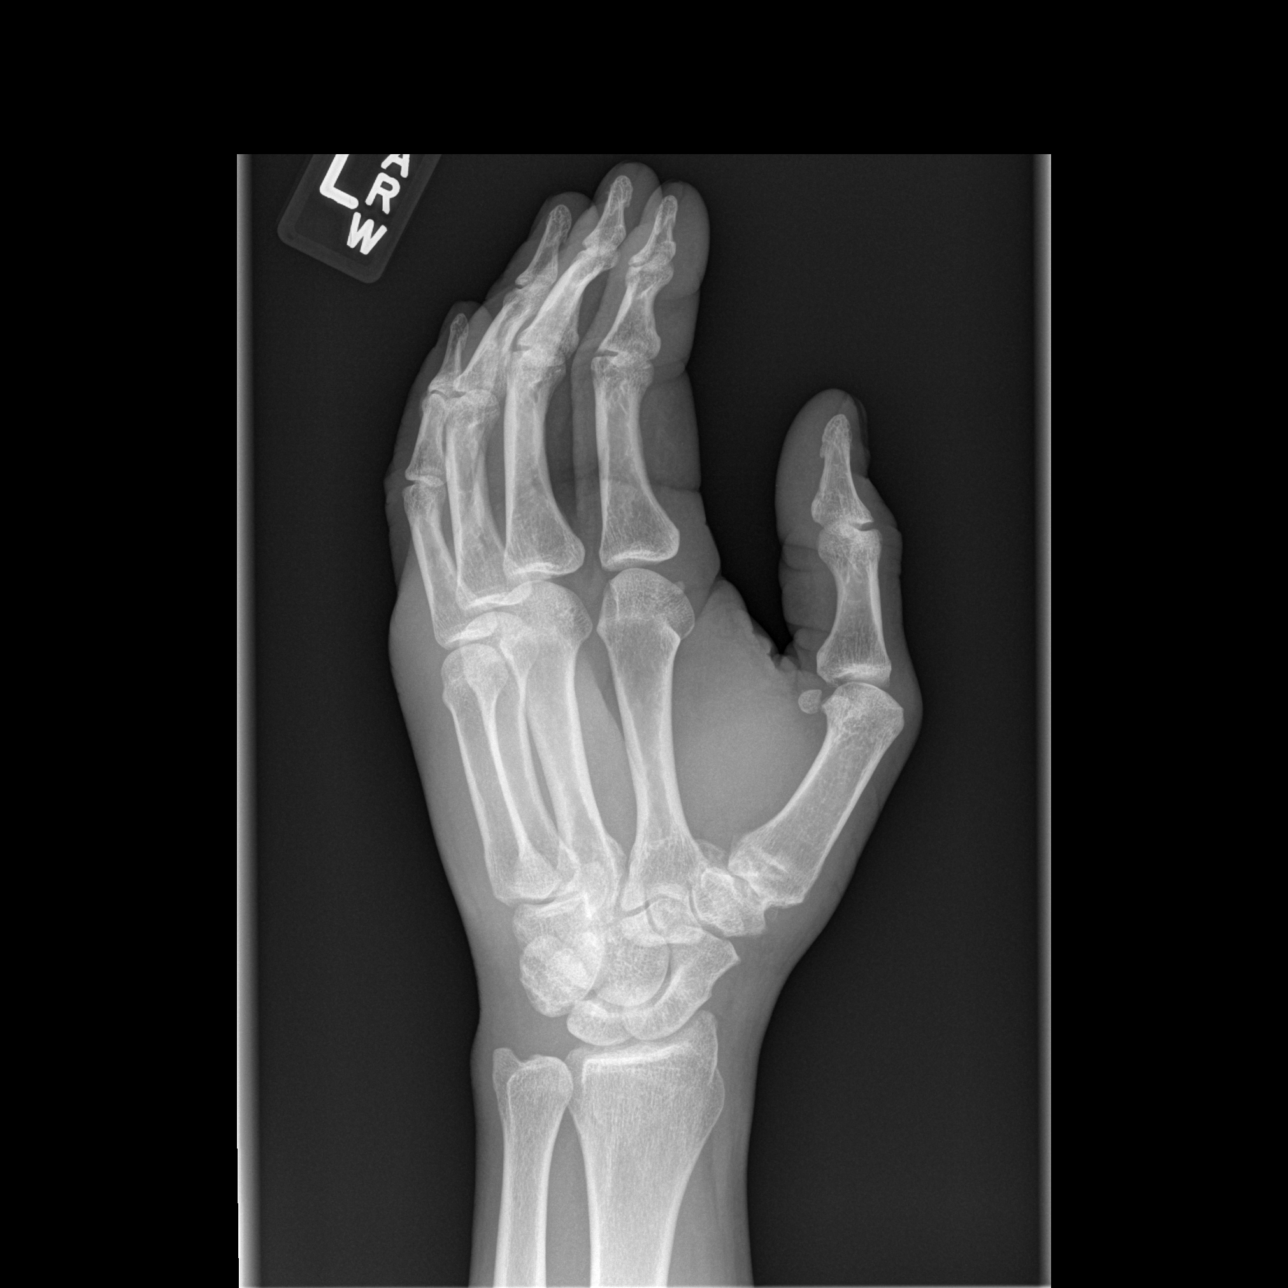

[x hand lat left]
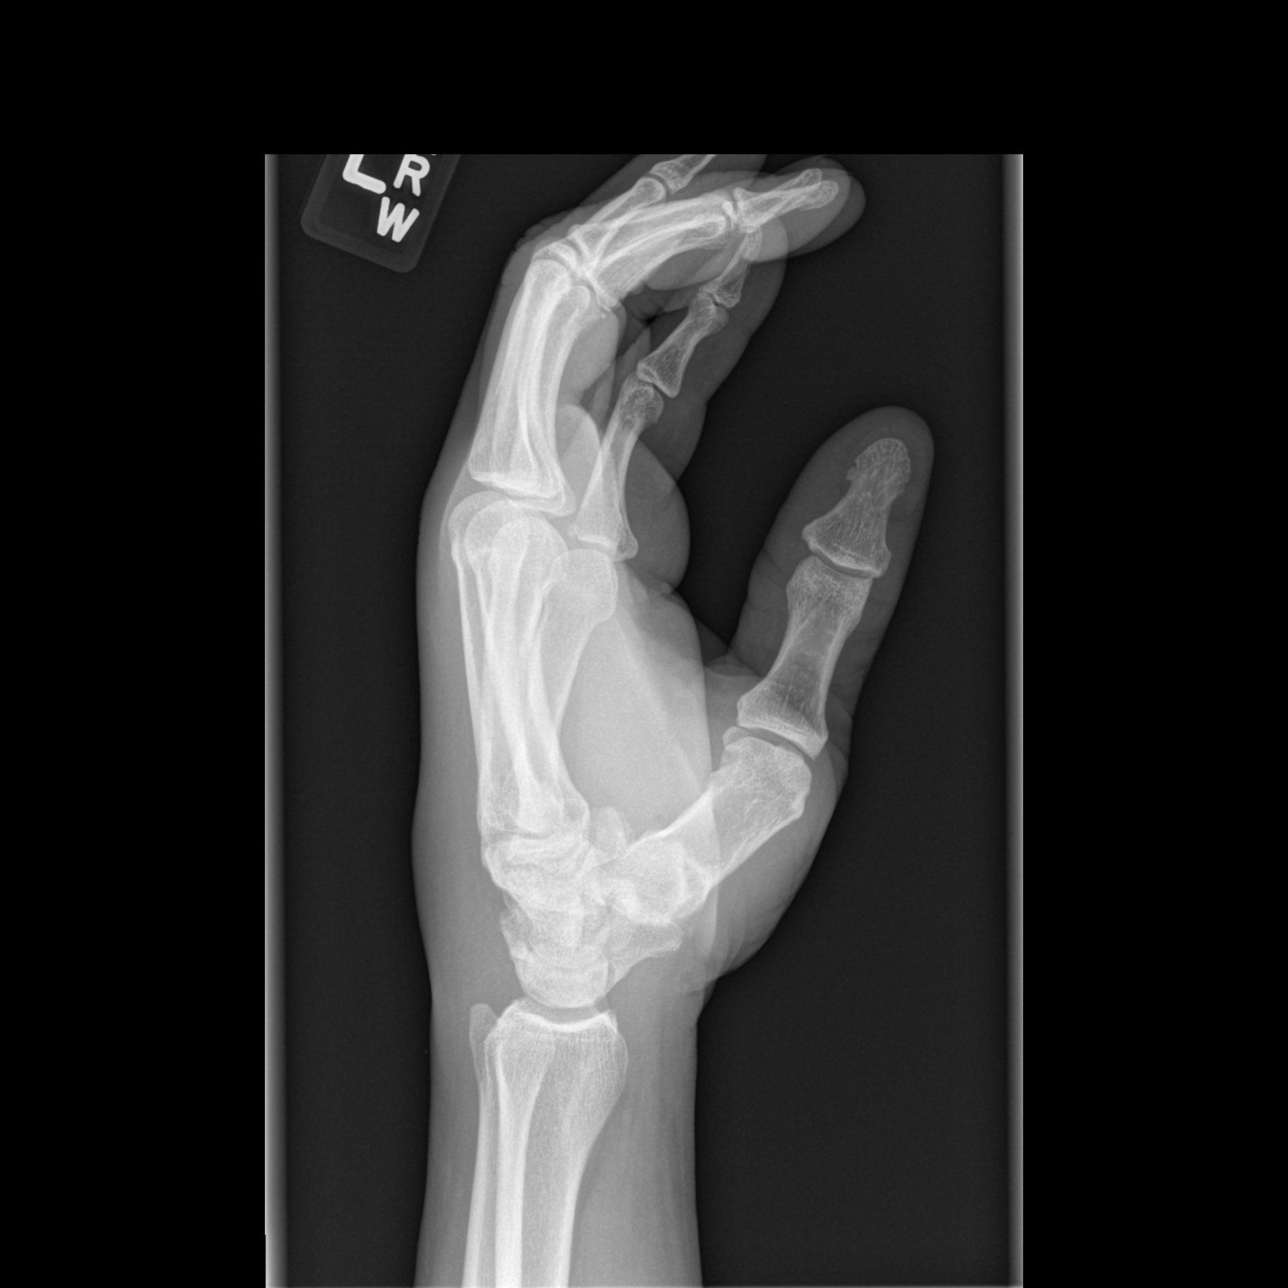

[3 of 3 positions shown; findings below may reference images not displayed]

FINDINGS: No evidence for acute fracture.  No subluxation or
dislocation.  Degenerative changes are seen at the first carpal
metacarpal joint.
IMPRESSION: No acute bony abnormality.

## 2010-02-26 IMAGING — CR DG SHOULDER 2+V*L*
3 series · 3 of 3 positions shown · non-contrast
Comparison: No prior studies.

CLINICAL DATA: Fell off ladder 1 week ago.  Pain.

LEFT SHOULDER - 2+ VIEW

[w shoulder ap internal left]
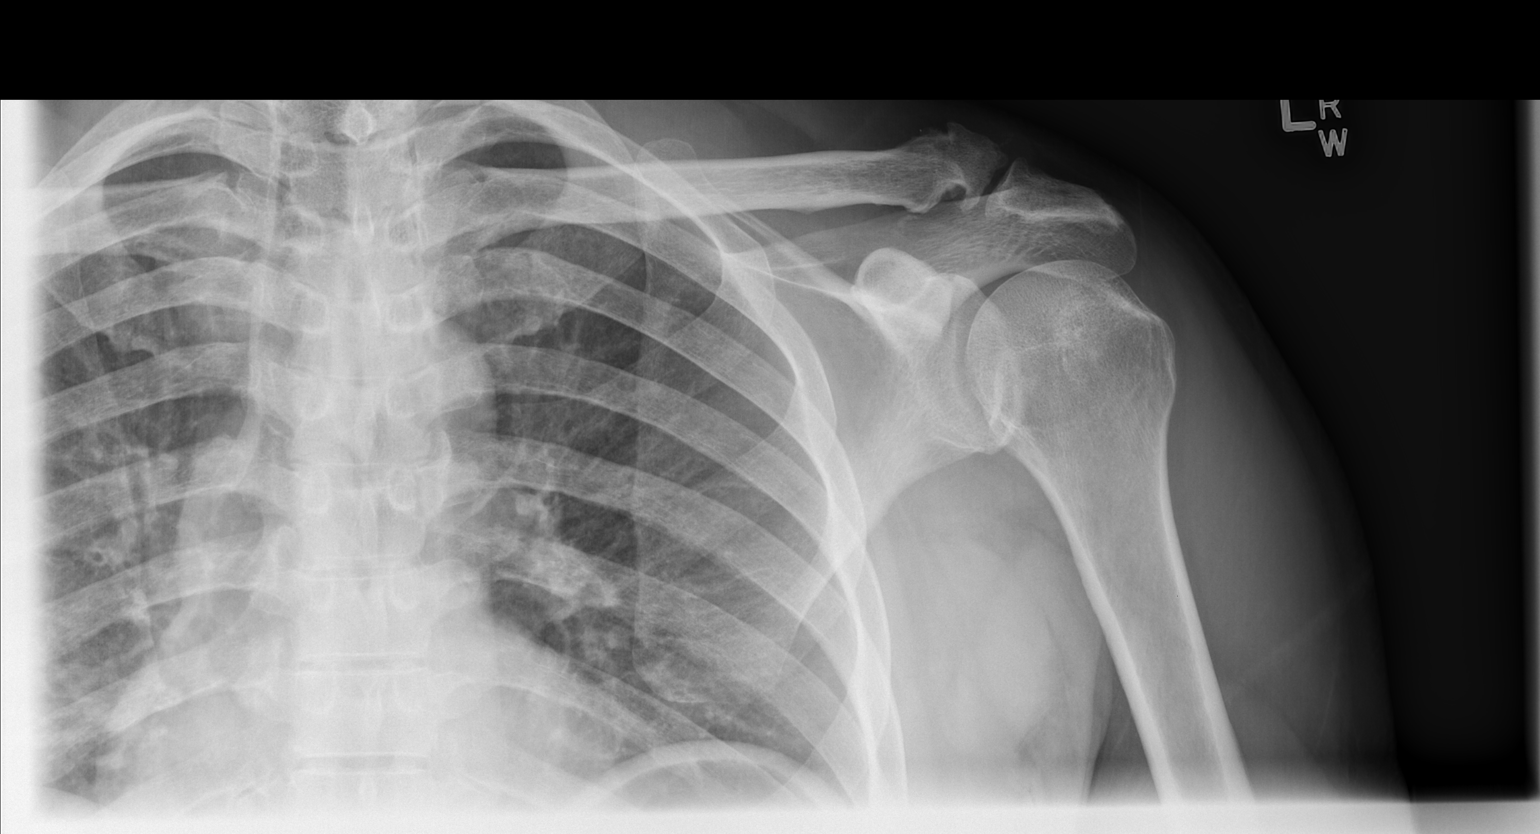

[w shoulder ap external left]
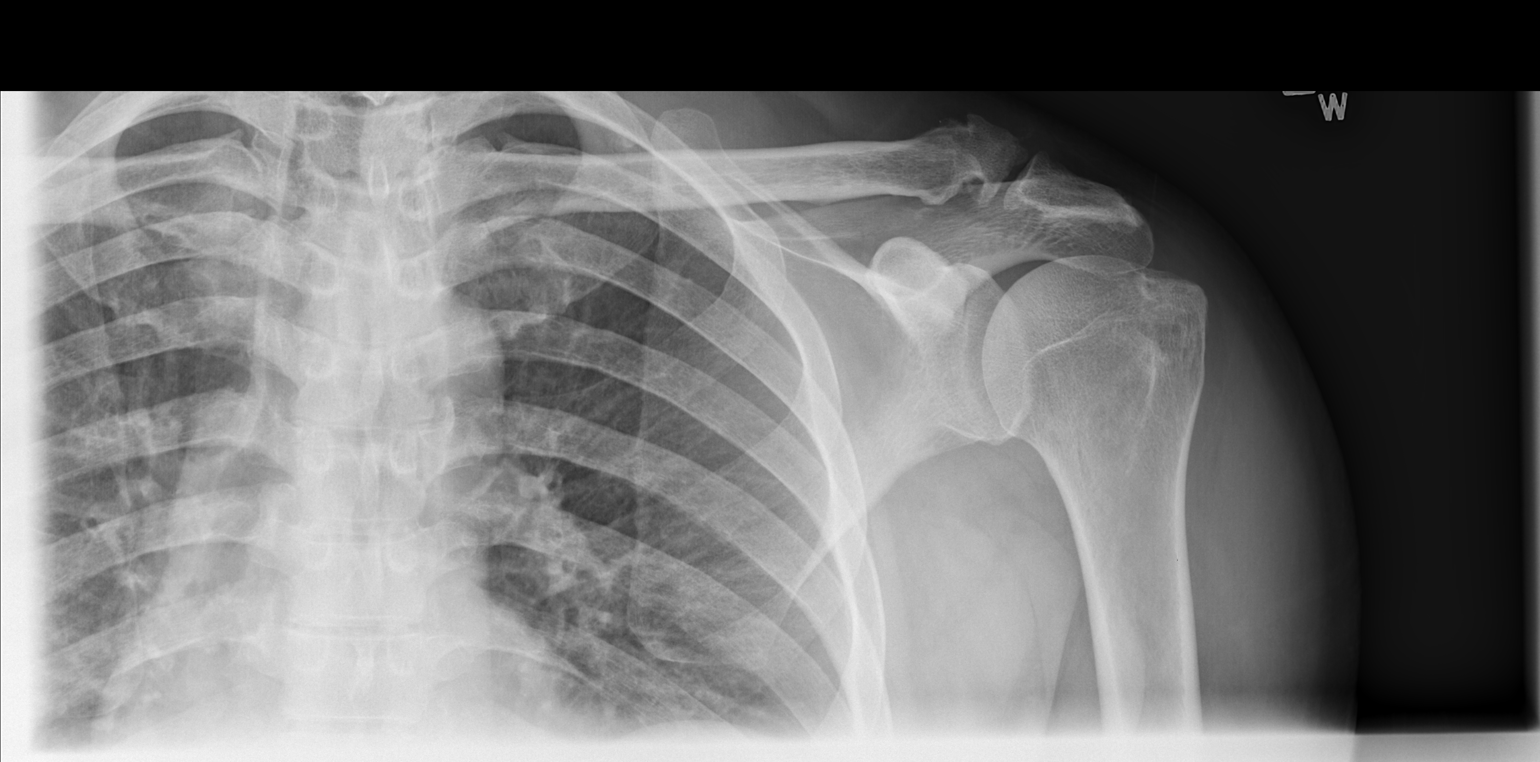

[w shoulder y view left]
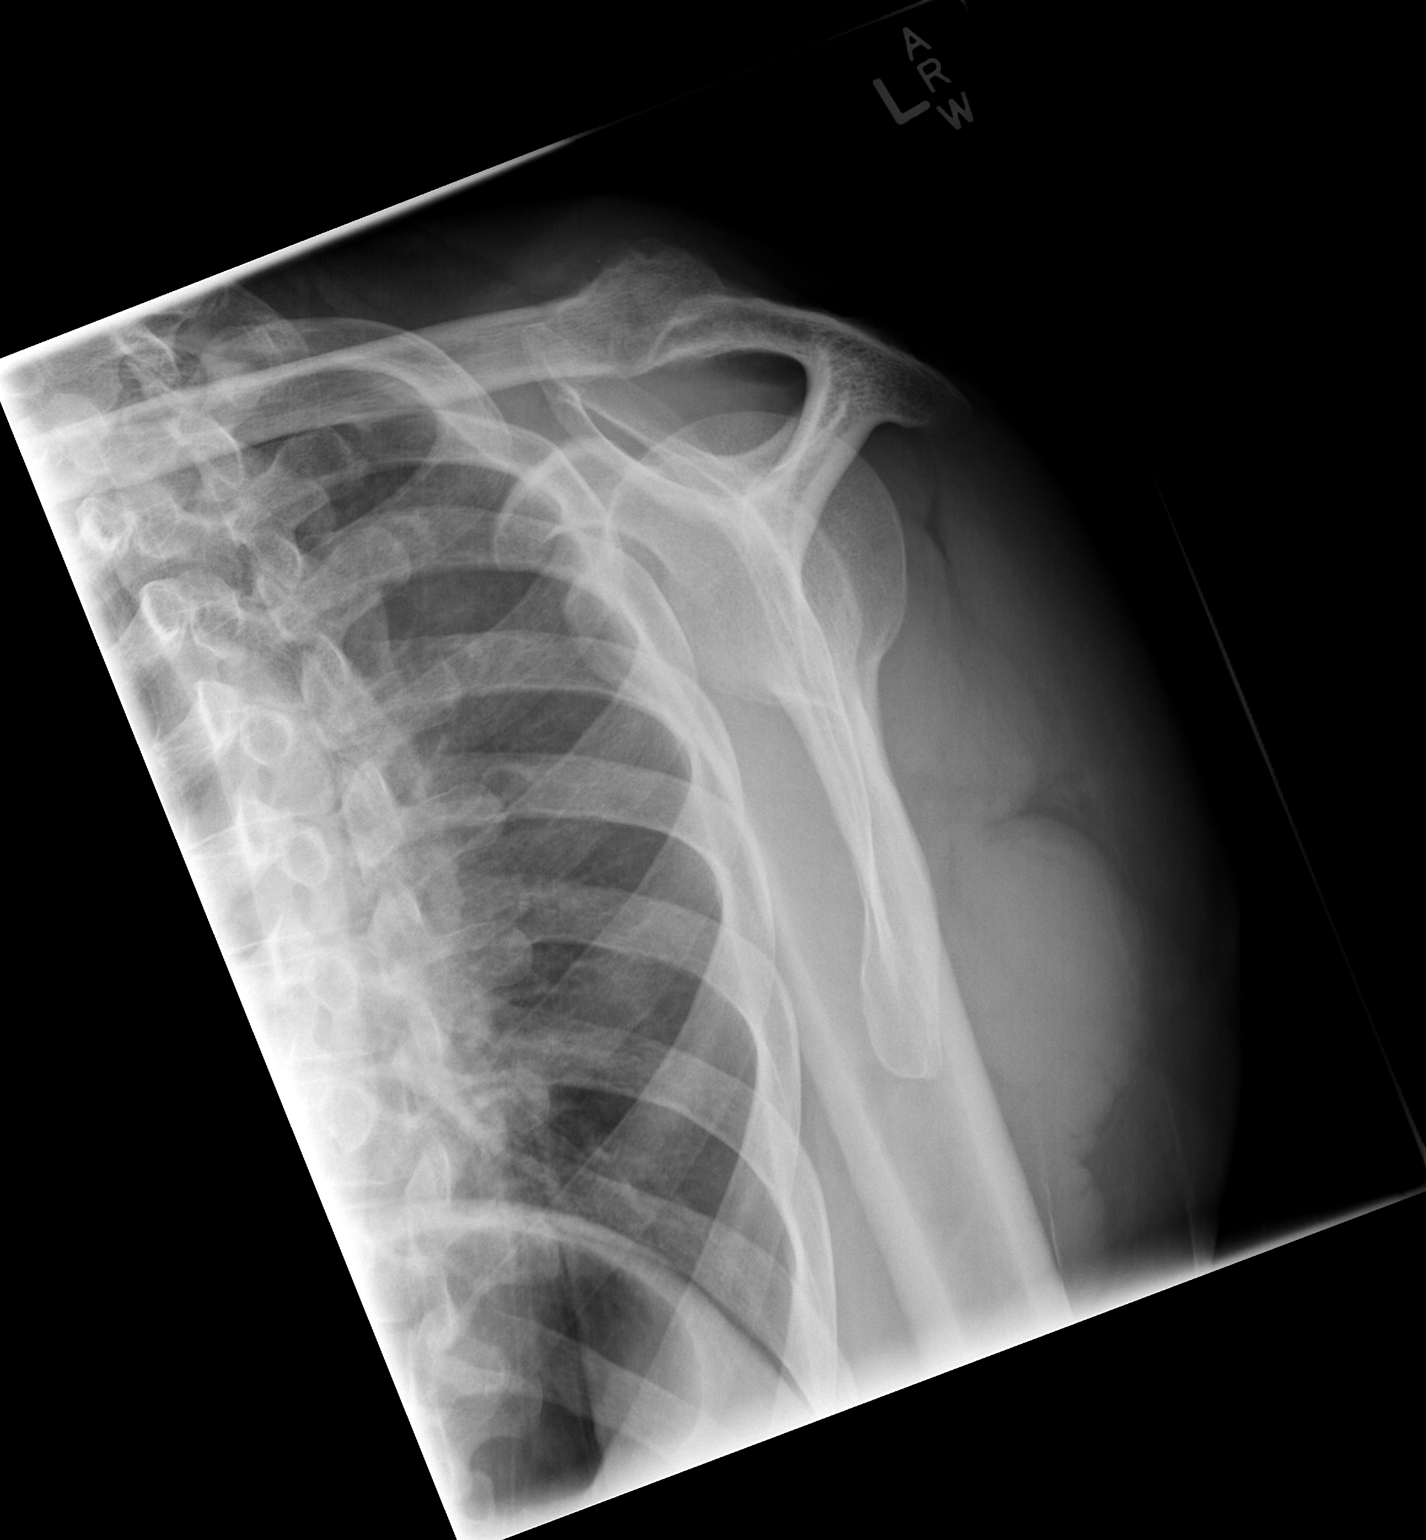

[3 of 3 positions shown; findings below may reference images not displayed]

FINDINGS: There are degenerative arthritic changes seen associated
with the left acromioclavicular joint.  There is no evidence for
fracture or dislocation.  The soft tissues have a normal
appearance.
IMPRESSION: Degenerative changes associated with the left acromioclavicular
joint.  No acute bony injury.

## 2010-03-16 ENCOUNTER — Ambulatory Visit: Payer: Self-pay | Admitting: Cardiovascular Disease

## 2010-03-16 ENCOUNTER — Inpatient Hospital Stay (HOSPITAL_COMMUNITY): Admission: EM | Admit: 2010-03-16 | Discharge: 2010-03-18 | Payer: Self-pay | Admitting: Emergency Medicine

## 2010-03-18 ENCOUNTER — Encounter (INDEPENDENT_AMBULATORY_CARE_PROVIDER_SITE_OTHER): Payer: Self-pay | Admitting: Internal Medicine

## 2010-03-27 IMAGING — CR DG LUMBAR SPINE COMPLETE 4+V
5 series · 5 of 5 positions shown · non-contrast
Comparison: 01/16/2008

CLINICAL DATA: Recent fall and work injury.  Low back pain.

LUMBAR SPINE - COMPLETE 4+ VIEW

[t l-spine a.p.]
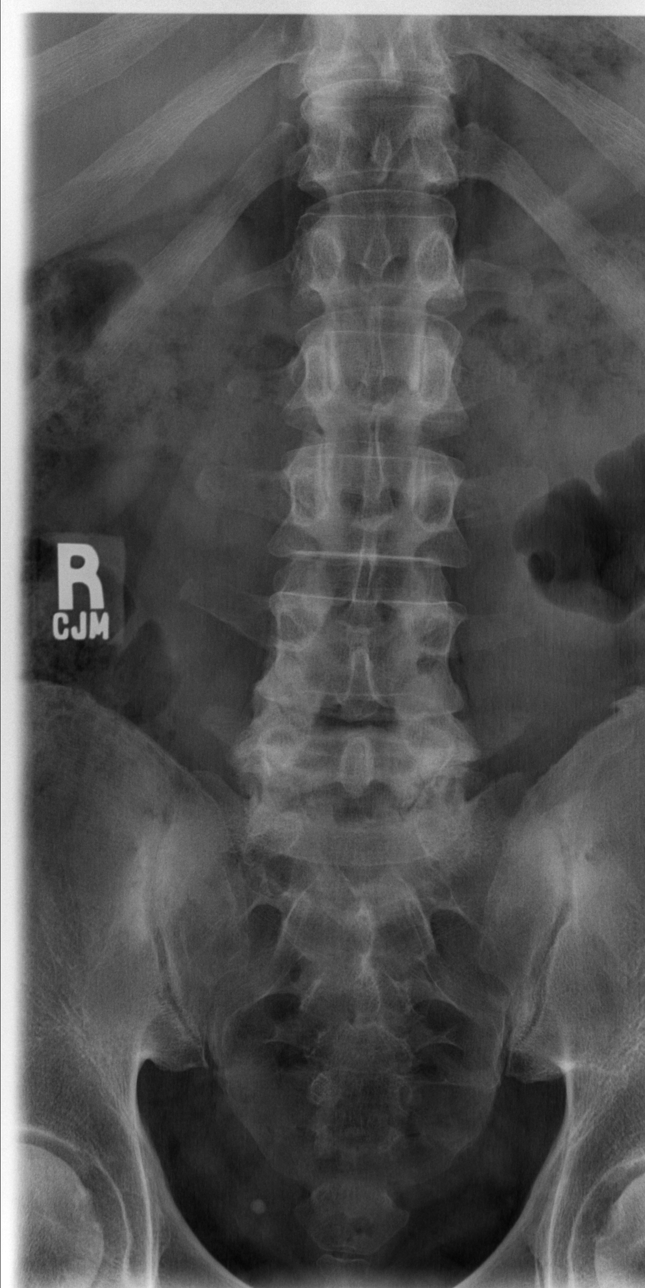

[t l-spine oblique exposure (1 of 2)]
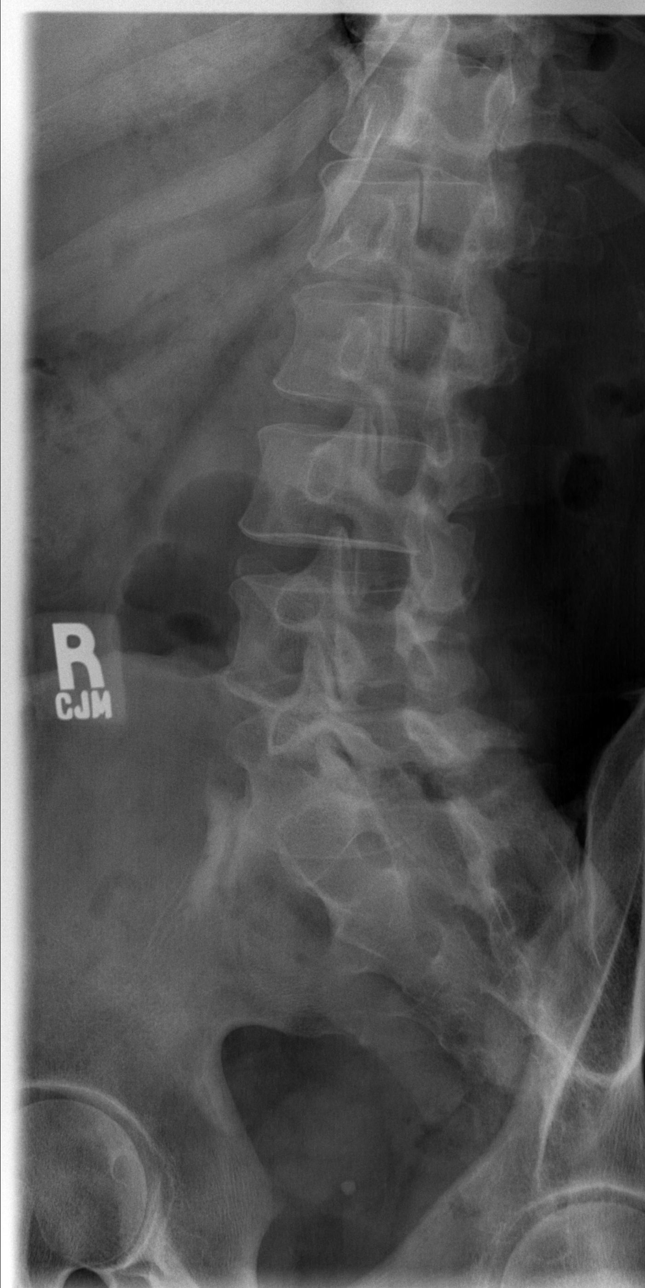

[t l-spine oblique exposure (2 of 2)]
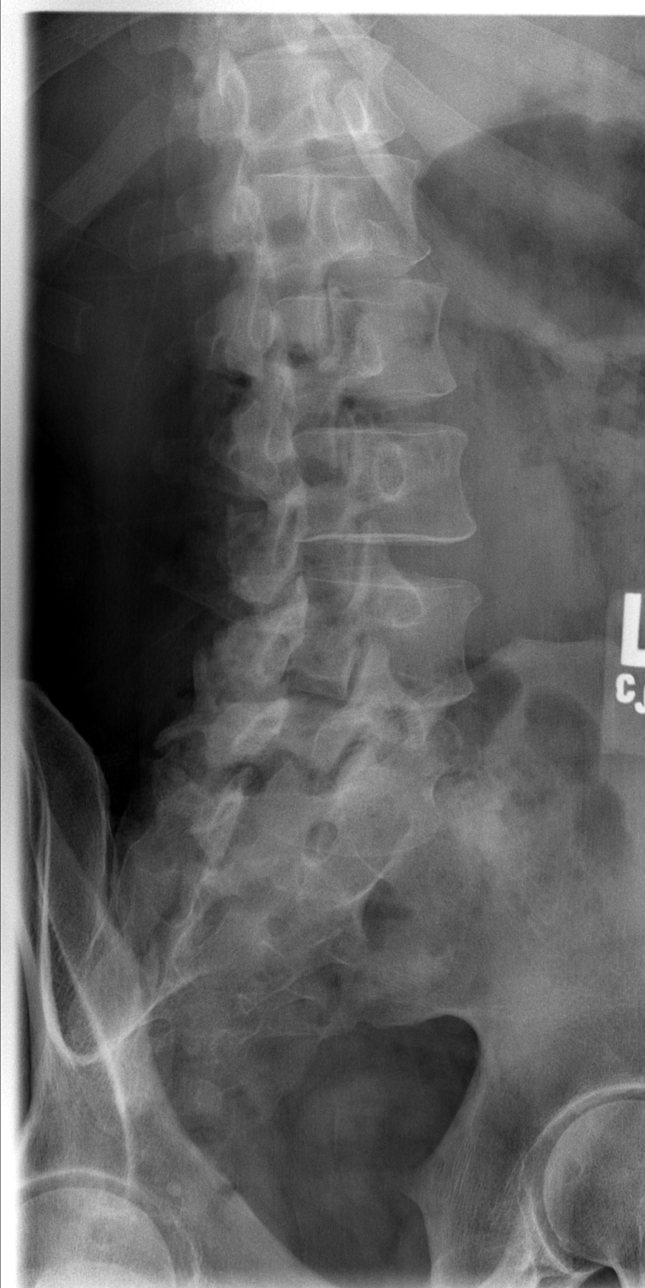

[t l-spine lat]
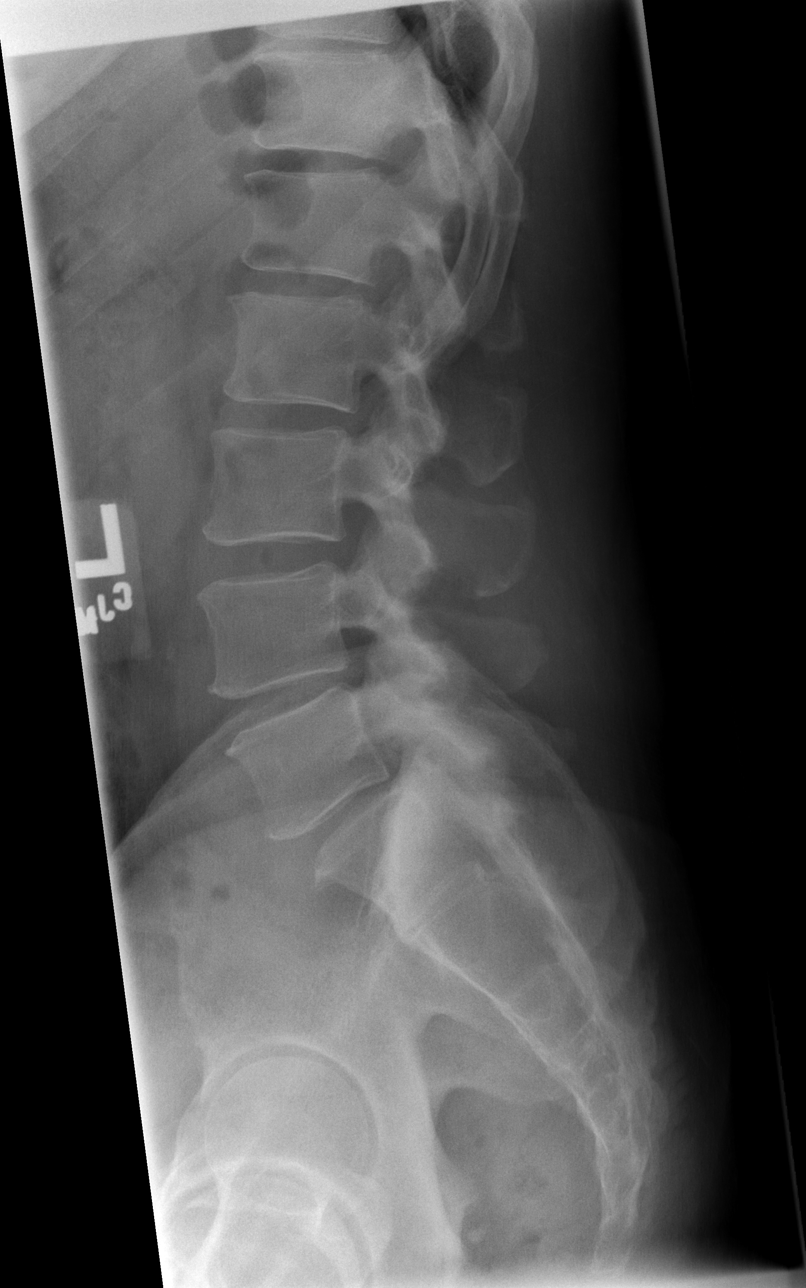

[t l-spine l5-s1 spot]
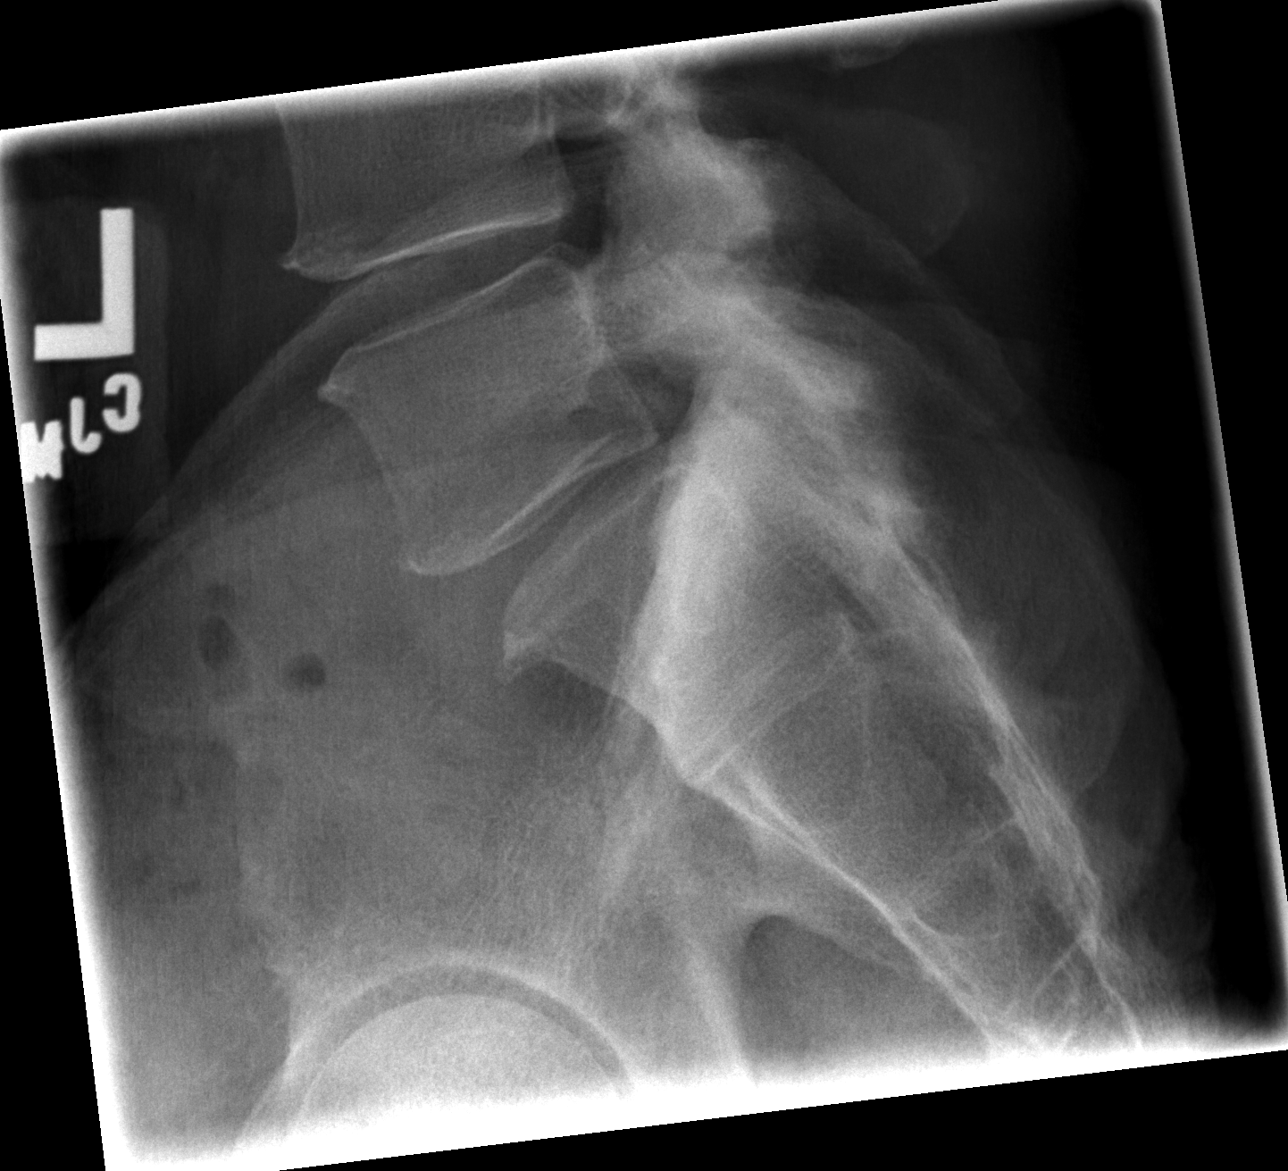

[5 of 5 positions shown; findings below may reference images not displayed]

FINDINGS: There is no evidence of lumbar spine fracture,
spondylolysis, or spondylolisthesis.

Mild degenerative disc disease shows progression at L4-5 since
prior study.

Mild bilateral facet DJD at L4-5 and L5-S1 has not significantly
changed.  Degenerative spurring of the sacroiliac joints is again
seen bilaterally and is stable in appearance.
IMPRESSION: 1.  No acute findings.
2.  Mild progression of L4-5 degenerative disc disease since prior
exam.
3.  Stable bilateral lower lumbar facet DJD and bilateral
sacroiliac degenerative joint disease.

## 2010-04-05 ENCOUNTER — Ambulatory Visit: Payer: Self-pay | Admitting: Cardiovascular Disease

## 2010-04-17 IMAGING — CR DG CHEST 2V
2 series · 2 of 2 positions shown · non-contrast
Comparison: 08/31/2008.

CLINICAL DATA: Headache and chest pain.

CHEST - 2 VIEW

[w chest pa]
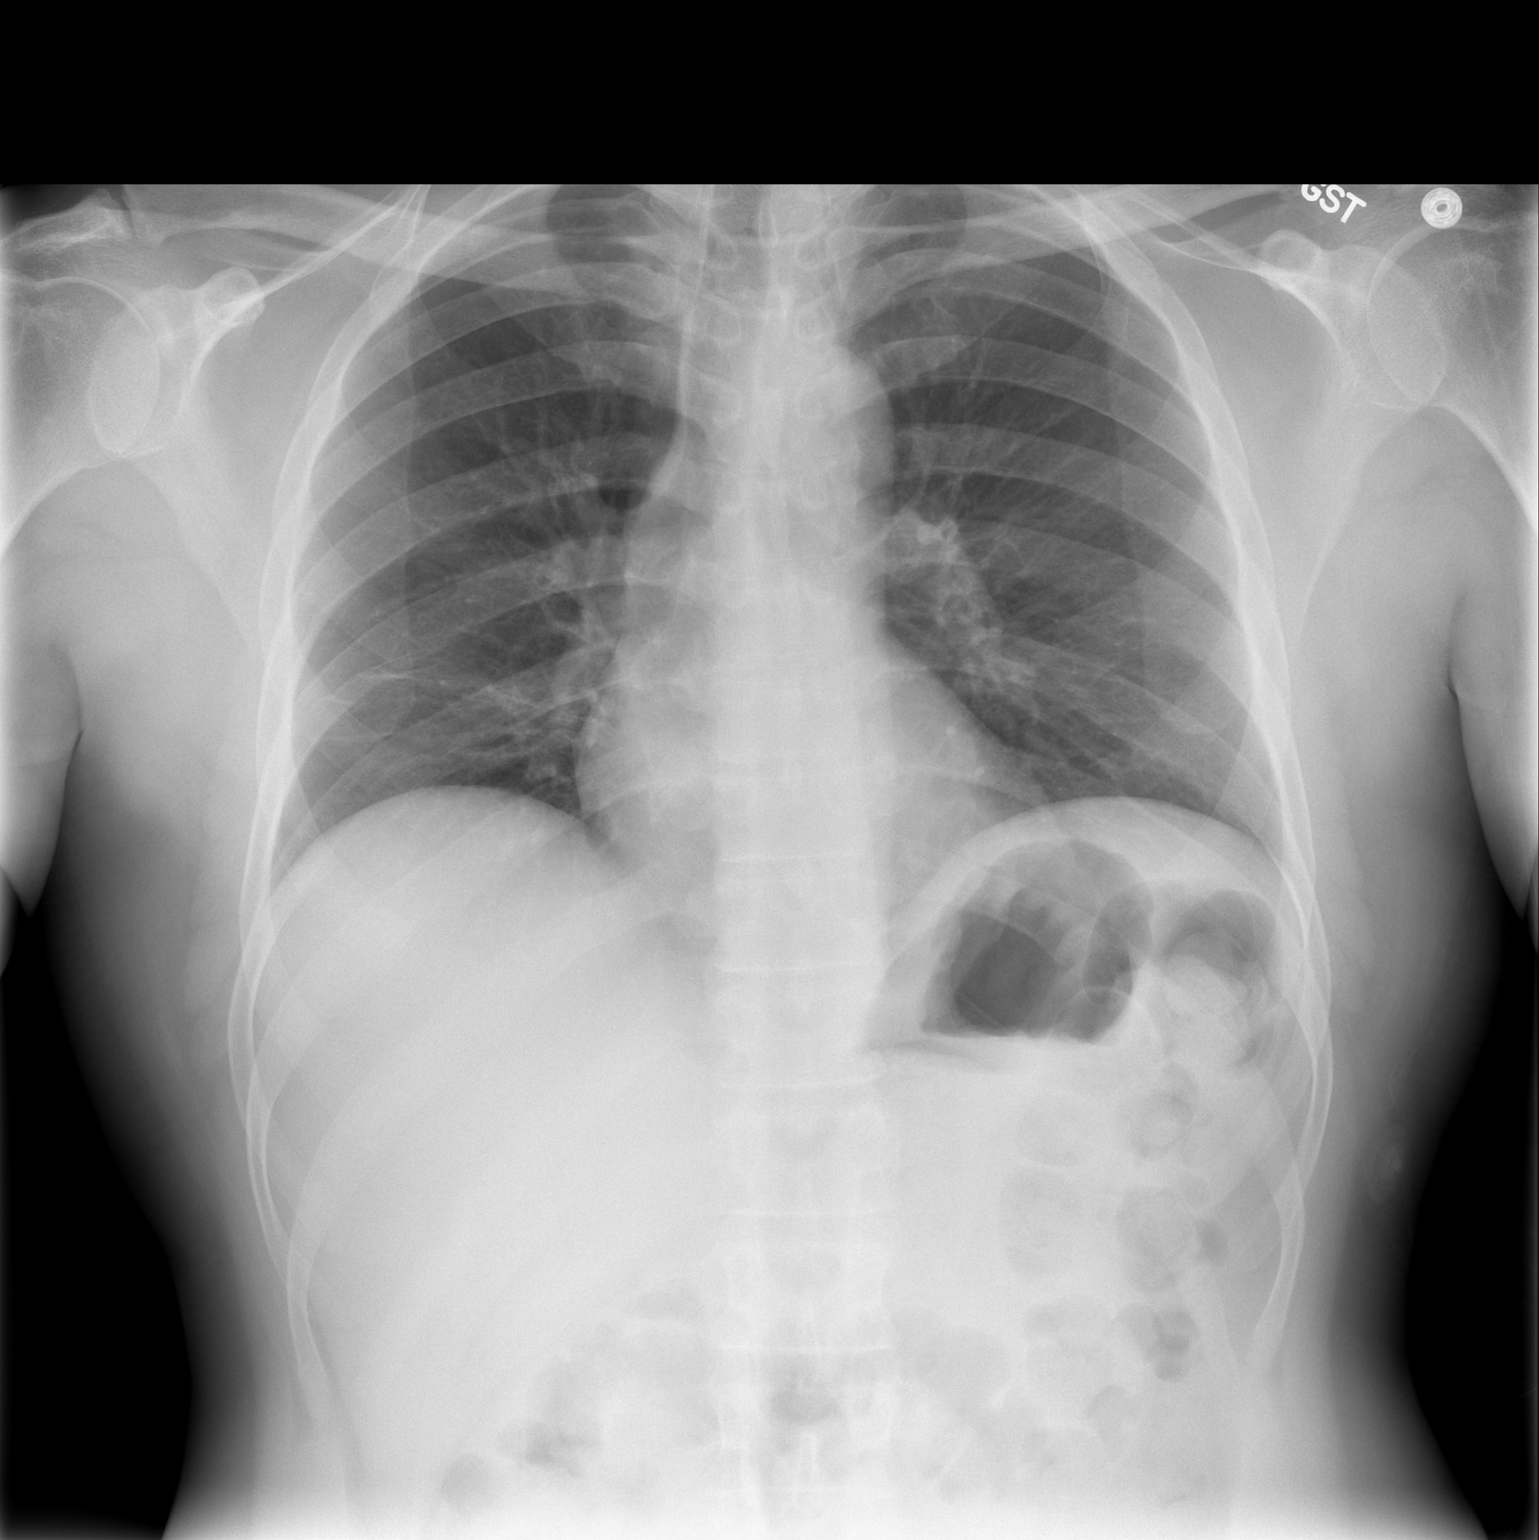

[w chest lat]
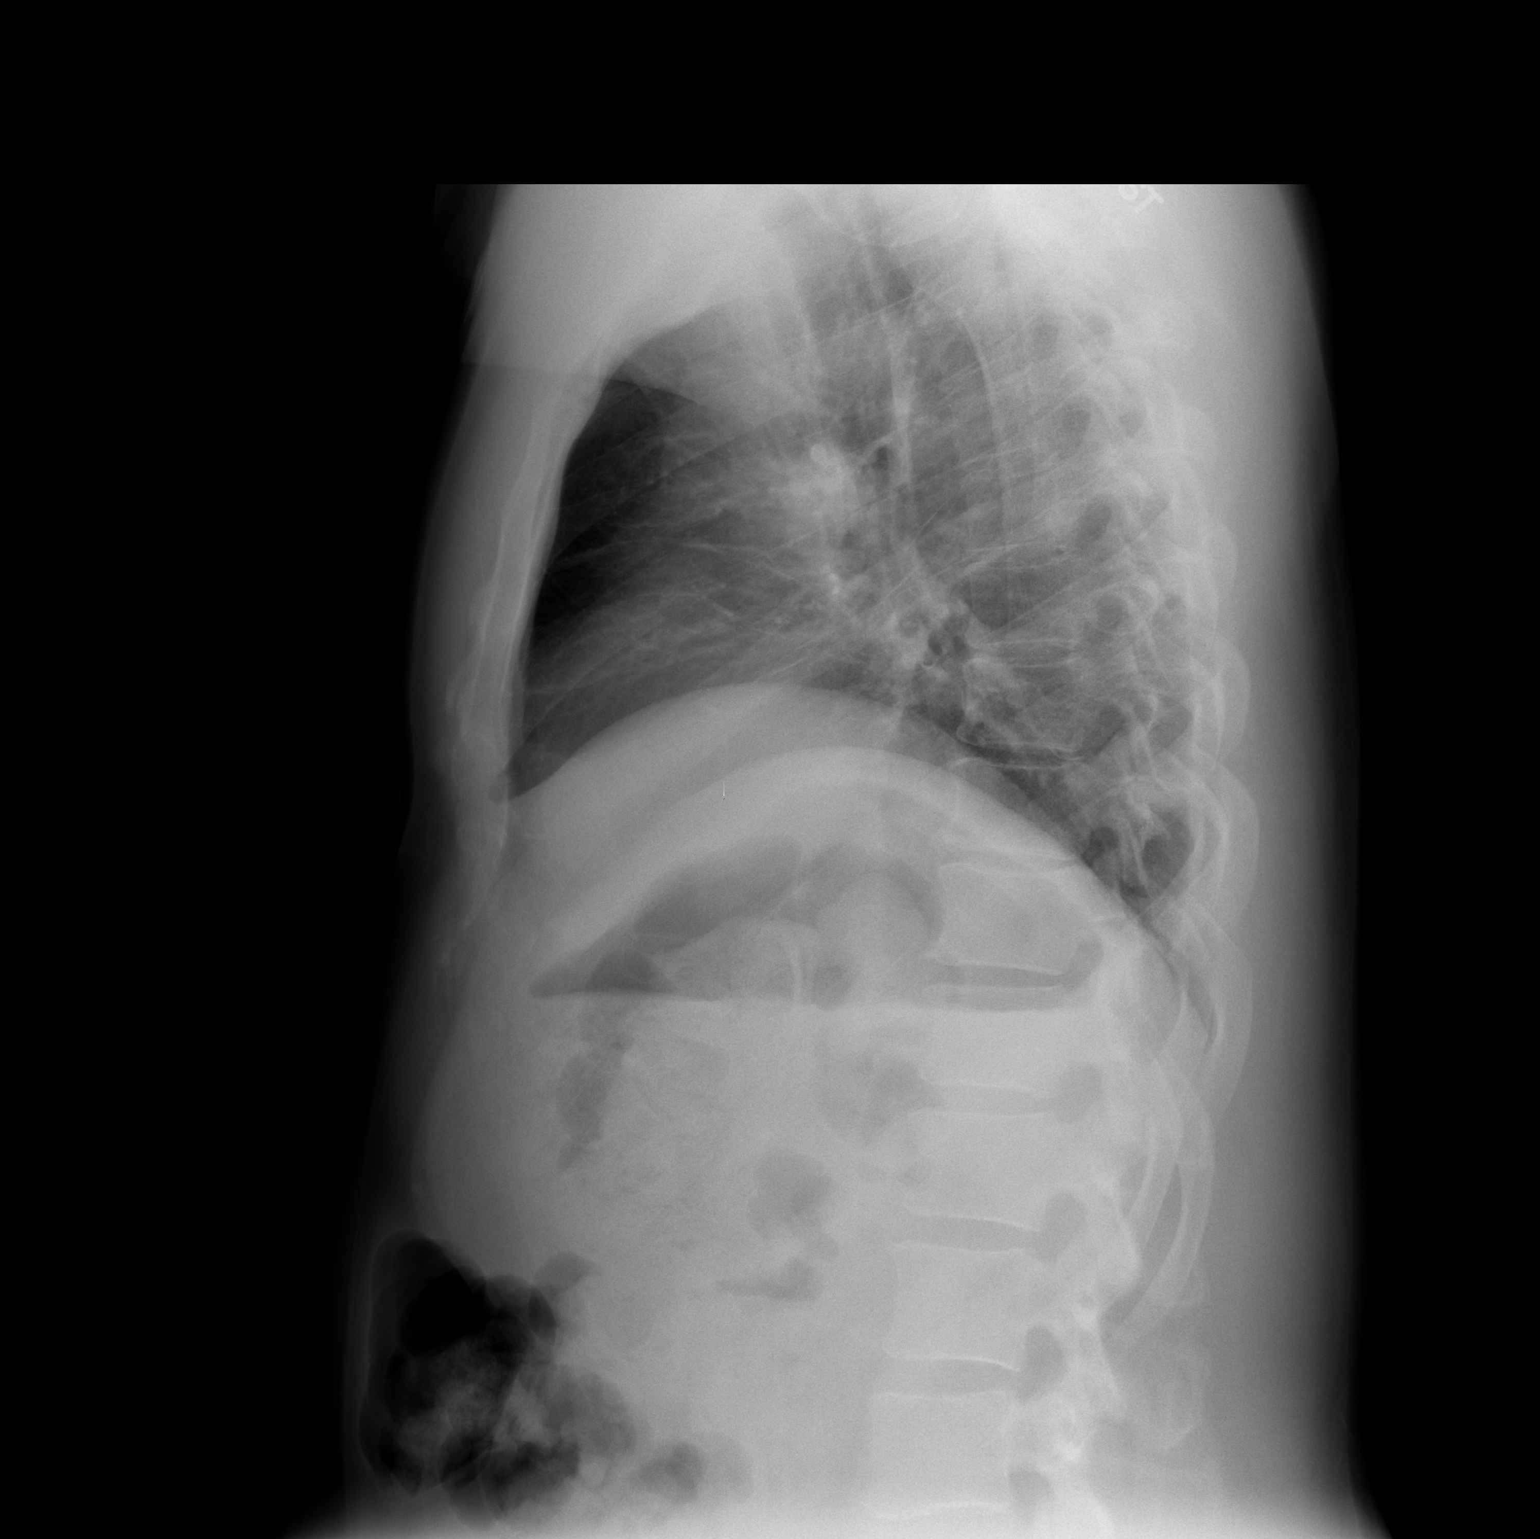

[2 of 2 positions shown; findings below may reference images not displayed]

FINDINGS: The cardiopericardial silhouette is within normal limits
for size.  Minimal linear atelectasis is present at the lung bases
bilaterally.  Lung volumes are somewhat low.  The upper lung fields
are clear.  The visualized soft tissues and bony thorax are
unremarkable.
IMPRESSION: Low lung volumes with minimal linear atelectasis at the lung bases
bilaterally.

## 2010-05-24 ENCOUNTER — Telehealth: Payer: Self-pay | Admitting: Cardiovascular Disease

## 2010-06-05 ENCOUNTER — Ambulatory Visit: Payer: Self-pay | Admitting: Cardiovascular Disease

## 2010-06-05 ENCOUNTER — Encounter (INDEPENDENT_AMBULATORY_CARE_PROVIDER_SITE_OTHER): Payer: Self-pay | Admitting: *Deleted

## 2010-06-05 DIAGNOSIS — R42 Dizziness and giddiness: Secondary | ICD-10-CM

## 2010-06-12 ENCOUNTER — Ambulatory Visit (HOSPITAL_COMMUNITY): Admission: RE | Admit: 2010-06-12 | Discharge: 2010-06-12 | Payer: Self-pay | Admitting: Cardiovascular Disease

## 2010-09-01 IMAGING — CR DG CHEST 2V
2 series · 2 of 2 positions shown · non-contrast
Comparison: 07/14/2009

CLINICAL DATA: Preop left shoulder rotator cuff tear

CHEST - 2 VIEW

[view not recorded (1 of 2)]
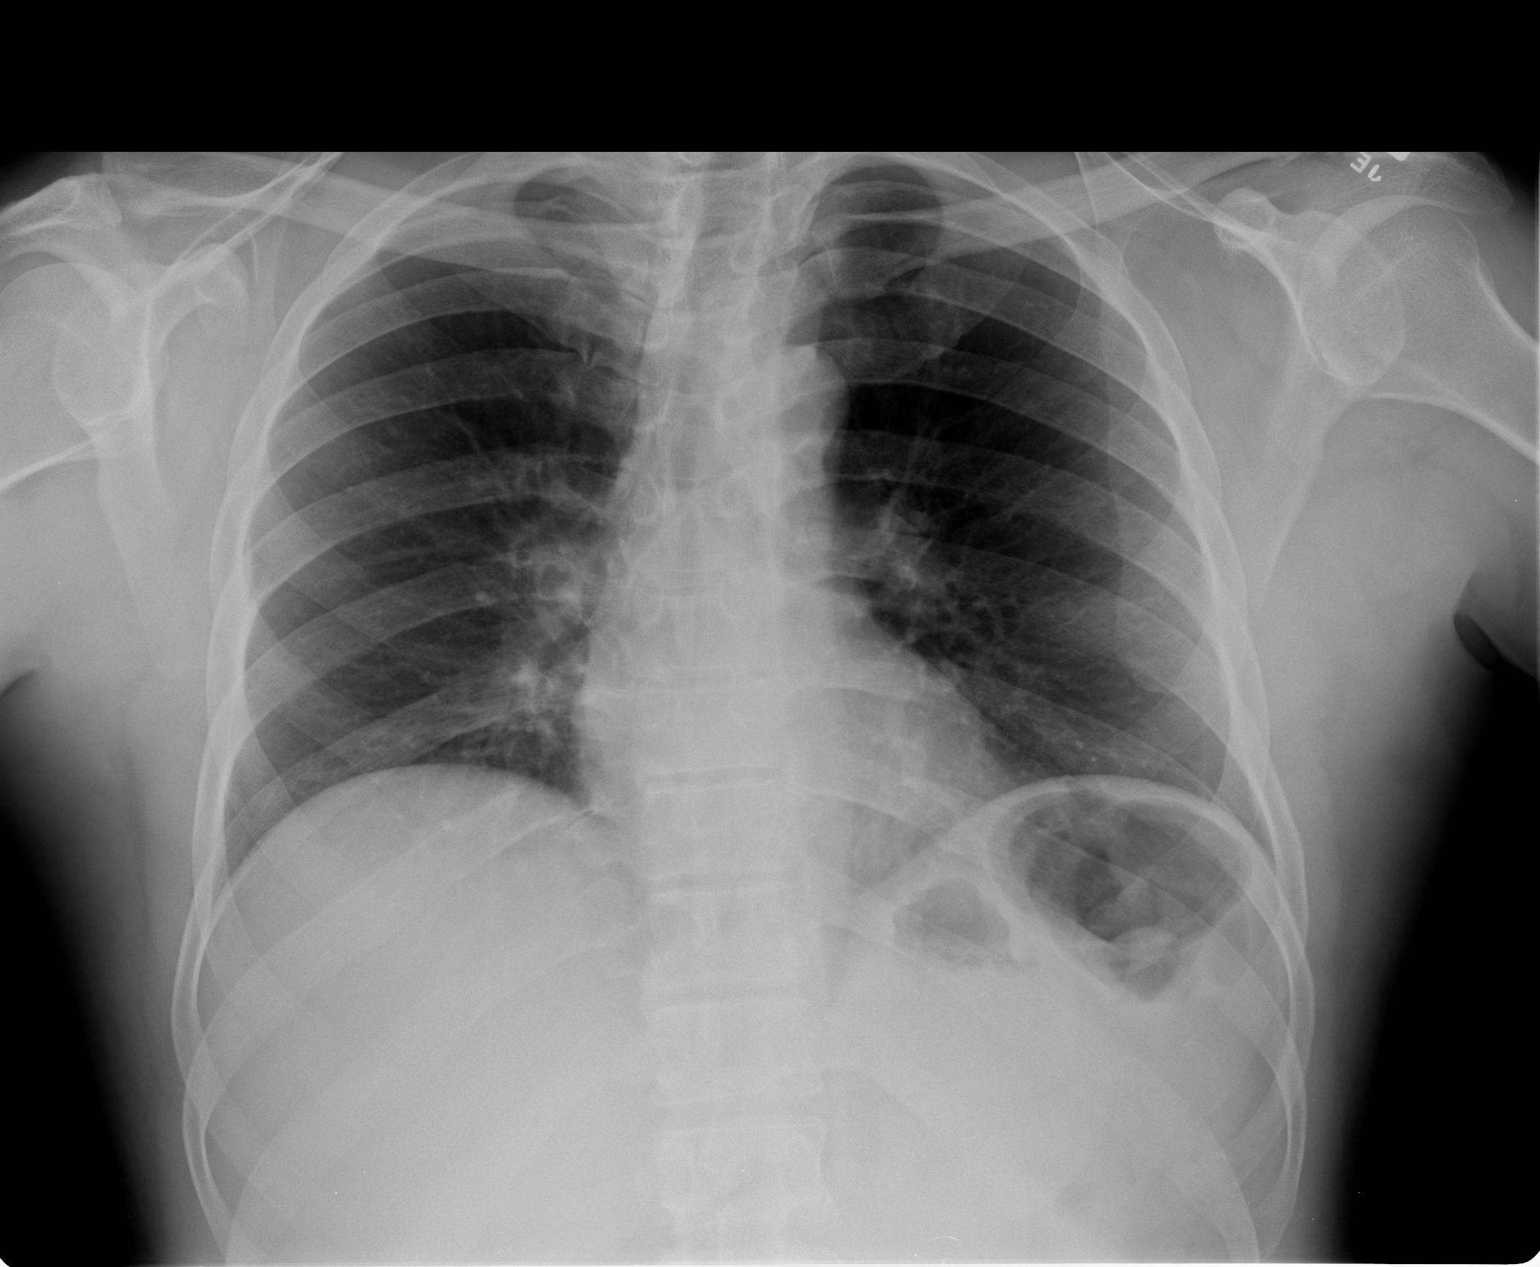

[view not recorded (2 of 2)]
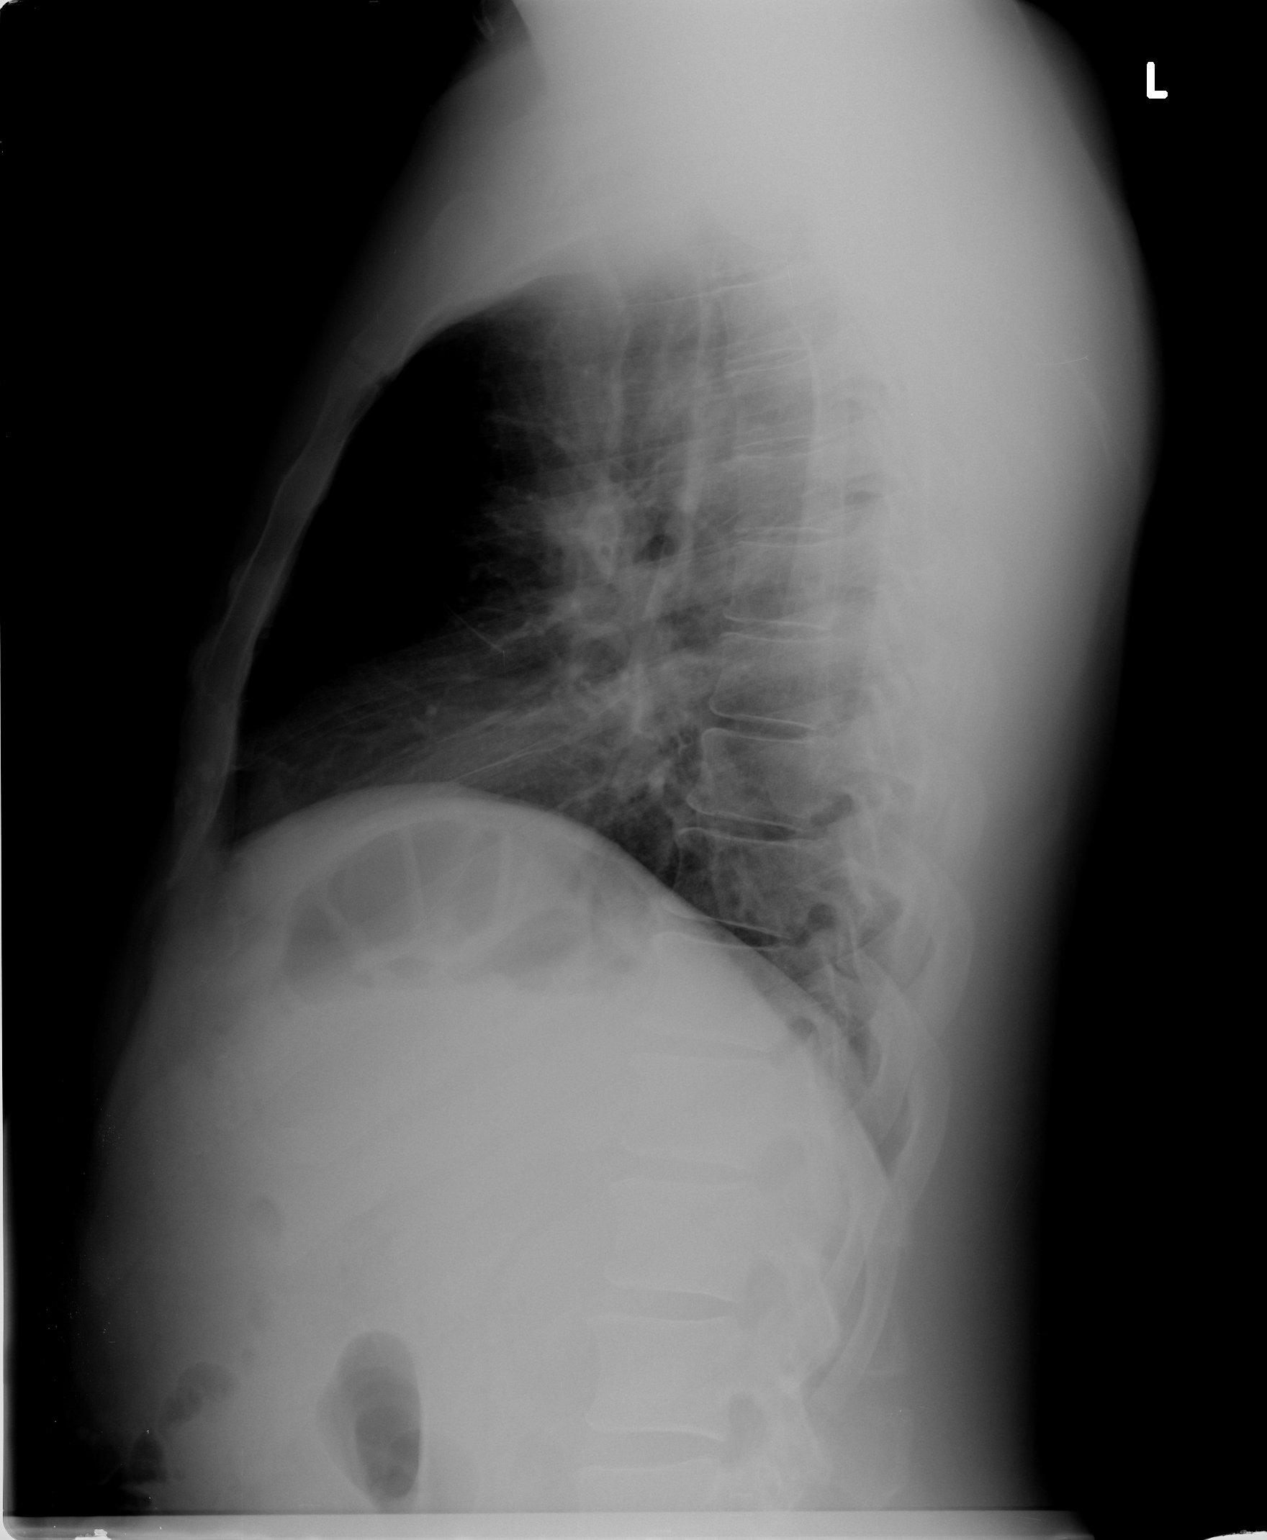

[2 of 2 positions shown; findings below may reference images not displayed]

FINDINGS: Cardiomediastinal silhouette is stable.  No acute
infiltrate or pleural effusion.  No pulmonary edema.  Linear
atelectasis or scarring in lingula is stable.
IMPRESSION: No active disease.  No significant change.

## 2010-10-10 ENCOUNTER — Telehealth (INDEPENDENT_AMBULATORY_CARE_PROVIDER_SITE_OTHER): Payer: Self-pay | Admitting: *Deleted

## 2010-10-19 ENCOUNTER — Emergency Department (HOSPITAL_COMMUNITY)
Admission: EM | Admit: 2010-10-19 | Discharge: 2010-10-19 | Payer: Self-pay | Source: Home / Self Care | Admitting: Emergency Medicine

## 2010-11-12 ENCOUNTER — Encounter: Payer: Self-pay | Admitting: Cardiovascular Disease

## 2010-11-23 NOTE — Letter (Signed)
Summary: Return To Work  [employer], Main Office  [address]   [place], [place] [address]   Phone: [phone]  Fax: [phone]    06/05/2010  TO: WHOM IT MAY CONCERN   RE: Russell Carter [address] ,[place]   The above named individual is under my medical care and may return to work on:06-06-10 WITH NO RESTRICTIONS  If you have any further questions or need additional information, please call.     Sincerely, Deliah Goody, RN/Dr Charlton Haws

## 2010-11-23 NOTE — Assessment & Plan Note (Signed)
Summary: rov/ gd  Medications Added LISINOPRIL 40 MG TABS (LISINOPRIL) Take one tablet by mouth daily AMLODIPINE BESYLATE 5 MG TABS (AMLODIPINE BESYLATE) Take one tablet by mouth daily FLUOXETINE HCL 20 MG CAPS (FLUOXETINE HCL) 1 tab by mouth once daily INDOMETHACIN 50 MG CAPS (INDOMETHACIN) as needed PRAVASTATIN SODIUM 40 MG TABS (PRAVASTATIN SODIUM) 1 once daily CARVEDILOL 3.125 MG TABS (CARVEDILOL) 1 two times a day      Allergies Added: NKDA  CC:  dizziness, follow up cath pt can not afford lipitor and plavix, and tricor so he is not taken those.  History of Present Illness: Russell Carter is seen today for F/U HTN, elevated lipids and SSCP.  Cath on 527/11 showed non obsturctive disease with 50% mid LAD stenosis.  He has had a SEMI in the past when he did cocaine.  His D/C meds included lipitor, plavix and tircor which totalled over $300's.  I told him to not to take any of these.  We will put him on generic Pravachol, have him follow a low fat diet and take an adult aspirin.  He is not having any SSCP.  His right wrist injury is healed.  He can return to work on [employer] data [employer] without limitations on 7/5.    Current Problems (verified): 1)  Hyperglycemia  (ICD-790.29) 2)  Gout, Unspecified  (ICD-274.9) 3)  Heart Block  (ICD-426.9) 4)  Hypercholesterolemia  (ICD-272.0) 5)  Chest Pain  (ICD-786.50) 6)  Myocardial Infarction  (ICD-410.90) 7)  Hyperlipidemia  (ICD-272.4) 8)  Hypertension  (ICD-401.9) 9)  Muscle Spasm  (ICD-728.85)  Current Medications (verified): 1)  Aspirin 81 Mg Tbec (Aspirin) .... Take One Tablet By Mouth Daily 2)  Nitroglycerin 0.4 Mg Subl (Nitroglycerin) .... One Tablet Under Tongue Every 5 Minutes As Needed For Chest Pain---May Repeat Times Three 3)  Lisinopril 40 Mg Tabs (Lisinopril) .... Take One Tablet By Mouth Daily 4)  Cyclobenzaprine Hcl 10 Mg Tabs (Cyclobenzaprine Hcl) .Marland Kitchen.. 1 Tab By Mouth Once Daily As Needed 5)  Meclizine Hcl 25 Mg Tabs (Meclizine Hcl)  .Marland Kitchen.. 1 Tab By Mouth Once Daily As Needed 6)  Oxycodone-Acetaminophen 5-325 Mg Tabs (Oxycodone-Acetaminophen) .... As Needed 7)  Amlodipine Besylate 5 Mg Tabs (Amlodipine Besylate) .... Take One Tablet By Mouth Daily 8)  Fluoxetine Hcl 20 Mg Caps (Fluoxetine Hcl) .Marland Kitchen.. 1 Tab By Mouth Once Daily 9)  Indomethacin 50 Mg Caps (Indomethacin) .... As Needed 10)  Pravastatin Sodium 40 Mg Tabs (Pravastatin Sodium) .Marland Kitchen.. 1 Once Daily  Allergies (verified): No Known Drug Allergies  Past History:  Past Medical History: Last updated: 05/18/2009 Current Problems:  HYPERGLYCEMIA (ICD-790.29) GOUT, UNSPECIFIED (ICD-274.9) HEART BLOCK (ICD-426.9) HYPERCHOLESTEROLEMIA (ICD-272.0) CHEST PAIN (ICD-786.50) MYOCARDIAL INFARCTION (ICD-410.90) HYPERLIPIDEMIA (ICD-272.4) HYPERTENSION (ICD-401.9) MUSCLE SPASM (ICD-728.85)    Past Surgical History: Last updated: 05/18/2009  DATE OF PROCEDURE:  05/22/2004  SURGEON:  Laurell Josephs E. Janee Morn, M.D.  PROCEDURE:  Repair of recurrent left inguinal hernia with mesh.   DATE OF PROCEDURE:  02/02/2003  SURGEON:  Violeta Gelinas, M.D.  PROCEDURE:  Repair of left inguinal hernia with mesh.  Family History: Last updated: 05/18/2009  Mother had MI's x7 and is still alive.  Father died of   an MI.  Brother died of an MI who was older than he is.      Social History: Last updated: 05/18/2009 He lives in [place] with his wife and son.  He is a   Environmental manager.  Does not smoke. Occasional beer.  Marijuana  on occasion.  Review of Systems       Denies fever, malais, weight loss, blurry vision, decreased visual acuity, cough, sputum, SOB, hemoptysis, pleuritic pain, palpitaitons, heartburn, abdominal pain, melena, lower extremity edema, claudication, or rash.   Vital Signs:  Patient profile:   55 year old male Height:      71 inches Weight:      194 pounds BMI:     27.16 Pulse rate:   86 / minute Resp:     12 per minute BP sitting:   158 / 92   (left arm)  Vitals Entered By: Kem Parkinson (April 05, 2010 4:11 PM)  Physical Exam  General:  Affect appropriate Healthy:  appears stated age HEENT: normal Neck supple with no adenopathy JVP normal no bruits no thyromegaly Lungs clear with no wheezing and good diaphragmatic motion Heart:  S1/S2 no murmur,rub, gallop or click PMI normal Abdomen: benighn, BS positve, no tenderness, no AAA no bruit.  No HSM or HJR Distal pulses intact with no bruits No edema Neuro non-focal Skin warm and dry    Impression & Recommendations:  Problem # 1:  HYPERCHOLESTEROLEMIA (ICD-272.0) Pravachol  may switch to lipitor when it goes generic latter this year The following medications were removed from the medication list:    Pravastatin Sodium 40 Mg Tabs (Pravastatin sodium) .Marland Kitchen... Take one tablet by mouth daily at bedtime His updated medication list for this problem includes:    Pravastatin Sodium 40 Mg Tabs (Pravastatin sodium) .Marland Kitchen... 1 once daily  Problem # 2:  CHEST PAIN (ICD-786.50)  His updated medication list for this problem includes:    Aspirin 81 Mg Tbec (Aspirin) .Marland Kitchen... Take one tablet by mouth daily    Nitroglycerin 0.4 Mg Subl (Nitroglycerin) ..... One tablet under tongue every 5 minutes as needed for chest pain---may repeat times three    Lisinopril 40 Mg Tabs (Lisinopril) .Marland Kitchen... Take one tablet by mouth daily    Amlodipine Besylate 5 Mg Tabs (Amlodipine besylate) .Marland Kitchen... Take one tablet by mouth daily    Carvedilol 3.125 Mg Tabs (Carvedilol) .Marland Kitchen... 1 two times a day  His updated medication list for this problem includes:    Aspirin 81 Mg Tbec (Aspirin) .Marland Kitchen... Take one tablet by mouth daily    Nitroglycerin 0.4 Mg Subl (Nitroglycerin) ..... One tablet under tongue every 5 minutes as needed for chest pain---may repeat times three    Lisinopril 40 Mg Tabs (Lisinopril) .Marland Kitchen... Take one tablet by mouth daily    Amlodipine Besylate 5 Mg Tabs (Amlodipine besylate) .Marland Kitchen... Take one tablet by  mouth daily  Problem # 3:  HYPERTENSION (ICD-401.9) Suboptimally controlled.  ? previous av block in hopital.  Givne CAD and relative tachycardia and systolic HTN will try low dose generic coreg and F/U in 8-10 weeks His updated medication list for this problem includes:    Aspirin 81 Mg Tbec (Aspirin) .Marland Kitchen... Take one tablet by mouth daily    Lisinopril 40 Mg Tabs (Lisinopril) .Marland Kitchen... Take one tablet by mouth daily    Amlodipine Besylate 5 Mg Tabs (Amlodipine besylate) .Marland Kitchen... Take one tablet by mouth daily  Patient Instructions: 1)  Your physician recommends that you schedule a follow-up appointment in: 8-10 WEEKS WITH DR Eden Emms 2)  Your physician recommends that you continue on your current medications as directed. Please refer to the Current Medication list given to you today.PRAVASTATIN 40 MG 1 once daily  3)  CARVEDILOL 3.125 MG two times a day Prescriptions: CARVEDILOL 3.125 MG TABS (CARVEDILOL) 1 two  times a day  #60 x 11   Entered by:   Scherrie Bateman, LPN   Authorized by:   Colon Branch, MD, [facility]   Signed by:   Scherrie Bateman, LPN on 78/29/5621   Method used:   Electronically to        [facility] (retail)       [address]       [place], [place]  [address]       Ph: [phone]       Fax: [phone]   RxID:   [id number] PRAVASTATIN SODIUM 40 MG TABS (PRAVASTATIN SODIUM) 1 once daily  #30 x 11   Entered by:   Scherrie Bateman, LPN   Authorized by:   Colon Branch, MD, [facility]   Signed by:   Scherrie Bateman, LPN on 95/63/8756   Method used:   Electronically to        [facility] (retail)       [address]       [place], [place]  [address]       Ph: [phone]       Fax: [phone]   RxID:   [id number]

## 2010-11-23 NOTE — Progress Notes (Signed)
   Pt called asking for Copies of Records done in [place] those completed he will pick up Thursday and sign Release. Russell Carter  October 10, 2010 9:26 AM     Appended Document:  PT picked up Records today...km

## 2010-11-23 NOTE — Progress Notes (Signed)
Summary: refill request   Phone Note Refill Request Message from:  Patient on May 24, 2010 10:31 AM  Refills Requested: Medication #1:  LISINOPRIL 40 MG TABS Take one tablet by mouth daily [facility] requesting 1 year refills   Method Requested: Telephone to Pharmacy Initial call taken by: Glynda Jaeger,  May 24, 2010 10:31 AM    Prescriptions: LISINOPRIL 40 MG TABS (LISINOPRIL) Take one tablet by mouth daily  #30 x 12   Entered by:   Kem Parkinson   Authorized by:   Colon Branch, MD, [facility]   Signed by:   Kem Parkinson on 05/24/2010   Method used:   Electronically to        [facility] (retail)       [address]       [place], [place]  [address]       Ph: [phone]       Fax: [phone]   RxID:   [id number]

## 2010-11-23 NOTE — Letter (Signed)
Summary: Return To Work  [employer], Main Office  [address]   [place], [place] [address]   Phone: [phone]  Fax: [phone]    04/05/2010  TO: Leodis Sias IT MAY CONCERN   RE: Russell Carter [address] [place]   The above named individual is under my medical care and may return to work on: April 25, 2010 WITHOUT RESTRICTIONS  If you have any further questions or need additional information, please call.   [phone]   Sincerely,   DR [name] Vinson Tietze/ Scherrie Bateman, LPN

## 2010-11-23 NOTE — Miscellaneous (Signed)
Summary: Orders Update  Clinical Lists Changes  Orders: Added new Referral order of MRI (MRI) - Signed 

## 2010-11-23 NOTE — Assessment & Plan Note (Signed)
Summary: per check out/sf  Medications Added ASPIRIN EC 325 MG TBEC (ASPIRIN) Take one tablet by mouth daily MECLIZINE HCL 25 MG TABS (MECLIZINE HCL) RAN OUT 1 tab by mouth once daily as needed MELOXICAM 15 MG TABS (MELOXICAM) 1 tab by mouth once daily      Allergies Added: NKDA  CC:  dizziness pt states its due to vertigo he ran out of med for dizziness.  History of Present Illness: Russell Carter is seen today for F/U HTN, elevated lipids and SSCP.  Cath on 527/11 showed non obstructive  disease with 50% mid LAD stenosis.  He has had a SEMI in the past when he did cocaine.  His D/C meds included lipitor, plavix and tircor which totalled over $300's.  I told him to not to take any of these.  He was placed on generic  Pravachol and  an adult aspirin.  He is not having any SSCP.  His right wrist injury is healed.  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    He has been having ongoing "vertigo" with dizzyness about once /week When he has an episode he cannot stand up. There is no associated nausea.  He had been getting meclizine form [facility]  but is no longer going there due to missed appt.   Current Problems (verified): 1)  Hyperglycemia  (ICD-790.29) 2)  Gout, Unspecified  (ICD-274.9) 3)  Heart Block  (ICD-426.9) 4)  Hypercholesterolemia  (ICD-272.0) 5)  Chest Pain  (ICD-786.50) 6)  Myocardial Infarction  (ICD-410.90) 7)  Hyperlipidemia  (ICD-272.4) 8)  Hypertension  (ICD-401.9) 9)  Muscle Spasm  (ICD-728.85)  Current Medications (verified): 1)  Aspirin Ec 325 Mg Tbec (Aspirin) .... Take One Tablet By Mouth Daily 2)  Nitroglycerin 0.4 Mg Subl (Nitroglycerin) .... One Tablet Under Tongue Every 5 Minutes As Needed For Chest Pain---May Repeat Times Three 3)  Lisinopril 40 Mg Tabs (Lisinopril) .... Take One Tablet By Mouth Daily 4)  Cyclobenzaprine Hcl 10 Mg Tabs (Cyclobenzaprine Hcl) .Marland Kitchen.. 1 Tab By Mouth Once Daily As Needed 5)  Meclizine Hcl 25 Mg Tabs  (Meclizine Hcl) .... Ran Out 1 Tab By Mouth Once Daily As Needed 6)  Oxycodone-Acetaminophen 5-325 Mg Tabs (Oxycodone-Acetaminophen) .... As Needed 7)  Fluoxetine Hcl 20 Mg Caps (Fluoxetine Hcl) .Marland Kitchen.. 1 Tab By Mouth Once Daily 8)  Indomethacin 50 Mg Caps (Indomethacin) .... As Needed 9)  Pravastatin Sodium 40 Mg Tabs (Pravastatin Sodium) .Marland Kitchen.. 1 Once Daily 10)  Carvedilol 3.125 Mg Tabs (Carvedilol) .Marland Kitchen.. 1 Two Times A Day 11)  Meloxicam 15 Mg Tabs (Meloxicam) .Marland Kitchen.. 1 Tab By Mouth Once Daily  Allergies (verified): No Known Drug Allergies  Past History:  Past Medical History: Last updated: 05/18/2009 Current Problems:  HYPERGLYCEMIA (ICD-790.29) GOUT, UNSPECIFIED (ICD-274.9) HEART BLOCK (ICD-426.9) HYPERCHOLESTEROLEMIA (ICD-272.0) CHEST PAIN (ICD-786.50) MYOCARDIAL INFARCTION (ICD-410.90) HYPERLIPIDEMIA (ICD-272.4) HYPERTENSION (ICD-401.9) MUSCLE SPASM (ICD-728.85)    Past Surgical History: Last updated: 05/18/2009  DATE OF PROCEDURE:  05/22/2004  SURGEON:  Laurell Josephs E. Janee Morn, M.D.  PROCEDURE:  Repair of recurrent left inguinal hernia with mesh.   DATE OF PROCEDURE:  02/02/2003  SURGEON:  Violeta Gelinas, M.D.  PROCEDURE:  Repair of left inguinal hernia with mesh.  Family History: Last updated: 05/18/2009  Mother had MI's x7 and is still alive.  Father died of   an MI.  Brother died of an MI who was older than he is.      Social History: Last updated: 05/18/2009 He lives in  [place] with his wife and son.  He is a   Environmental manager.  Does not smoke. Occasional beer.  Marijuana  on occasion.   Review of Systems       Denies fever, malais, weight loss, blurry vision, decreased visual acuity, cough, sputum, SOB, hemoptysis, pleuritic pain, palpitaitons, heartburn, abdominal pain, melena, lower extremity edema, claudication, or rash.   Vital Signs:  Patient profile:   55 year old male Height:      71 inches Weight:      188 pounds BMI:     26.32 Pulse rate:    69 / minute Resp:     12 per minute BP sitting:   151 / 104  (left arm)  Vitals Entered By: Kem Parkinson (June 05, 2010 2:01 PM)  Physical Exam  General:  Affect appropriate Healthy:  appears stated age HEENT: normal Neck supple with no adenopathy JVP normal no bruits no thyromegaly Lungs clear with no wheezing and good diaphragmatic motion Heart:  S1/S2 no murmur,rub, gallop or click PMI normal Abdomen: benighn, BS positve, no tenderness, no AAA no bruit.  No HSM or HJR Distal pulses intact with no bruits No edema Neuro non-focal Skin warm and dry    Impression & Recommendations:  Problem # 1:  CHEST PAIN (ICD-786.50) 50% LAD by cath 5/11 continue medical Rx. The following medications were removed from the medication list:    Amlodipine Besylate 5 Mg Tabs (Amlodipine besylate) .Marland Kitchen... Take one tablet by mouth daily His updated medication list for this problem includes:    Aspirin Ec 325 Mg Tbec (Aspirin) .Marland Kitchen... Take one tablet by mouth daily    Nitroglycerin 0.4 Mg Subl (Nitroglycerin) ..... One tablet under tongue every 5 minutes as needed for chest pain---may repeat times three    Lisinopril 40 Mg Tabs (Lisinopril) .Marland Kitchen... Take one tablet by mouth daily    Carvedilol 3.125 Mg Tabs (Carvedilol) .Marland Kitchen... 1 two times a day  Problem # 2:  HYPERCHOLESTEROLEMIA (ICD-272.0) Continue generic statin labs per primary His updated medication list for this problem includes:    Pravastatin Sodium 40 Mg Tabs (Pravastatin sodium) .Marland Kitchen... 1 once daily  Problem # 3:  HYPERTENSION (ICD-401.9) Continue ACE and BB may require diuretic addition The following medications were removed from the medication list:    Amlodipine Besylate 5 Mg Tabs (Amlodipine besylate) .Marland Kitchen... Take one tablet by mouth daily His updated medication list for this problem includes:    Aspirin Ec 325 Mg Tbec (Aspirin) .Marland Kitchen... Take one tablet by mouth daily    Lisinopril 40 Mg Tabs (Lisinopril) .Marland Kitchen... Take one tablet by mouth  daily    Carvedilol 3.125 Mg Tabs (Carvedilol) .Marland Kitchen... 1 two times a day  Problem # 4:  DIZZINESS (ICD-780.4)  Check head CT R/O acoustic neuroma and refer to ENT.  Will call in meclizine for him  Orders: ENT Referral (ENT) MRI (MRI)  Patient Instructions: 1)  Your physician recommends that you schedule a follow-up appointment in: 6 MONTHS 2)  You have been referred to ENT FOR DIZZINESS/VERTIGO 3)  SCHEDULE MRI FOR DIZZINESS/VERTIGO AND R/O ACUSTICNEUROMA Prescriptions: MECLIZINE HCL 25 MG TABS (MECLIZINE HCL) RAN OUT 1 tab by mouth once daily as needed  #30 x 2   Entered by:   Deliah Goody, RN   Authorized by:   Colon Branch, MD, [facility]   Signed by:   Deliah Goody, RN on 06/05/2010   Method used:   [facility]       [address]       [place], [place]  [address]       Ph: [phone]       Fax: [phone]   RxID:   [id number] NITROGLYCERIN 0.4 MG SUBL (NITROGLYCERIN) One tablet under tongue every 5 minutes as needed for chest pain---may repeat times three  #25 x 12   Entered by:   Deliah Goody, RN   Authorized by:   Colon Branch, MD, [facility]   Signed by:   Deliah Goody, RN on 06/05/2010   Method used:   Electronically to        [facility] (retail)       [address]       [place], [place]  [address]       Ph: [phone]       Fax: [phone]   RxID:   [id number]

## 2010-12-18 IMAGING — CR DG CHEST 1V PORT
1 series · 1 of 1 positions shown · non-contrast
Comparison: Chest radiograph performed 11/28/2009

CLINICAL DATA: Status post fall; fever.

PORTABLE CHEST - 1 VIEW

[view not recorded]
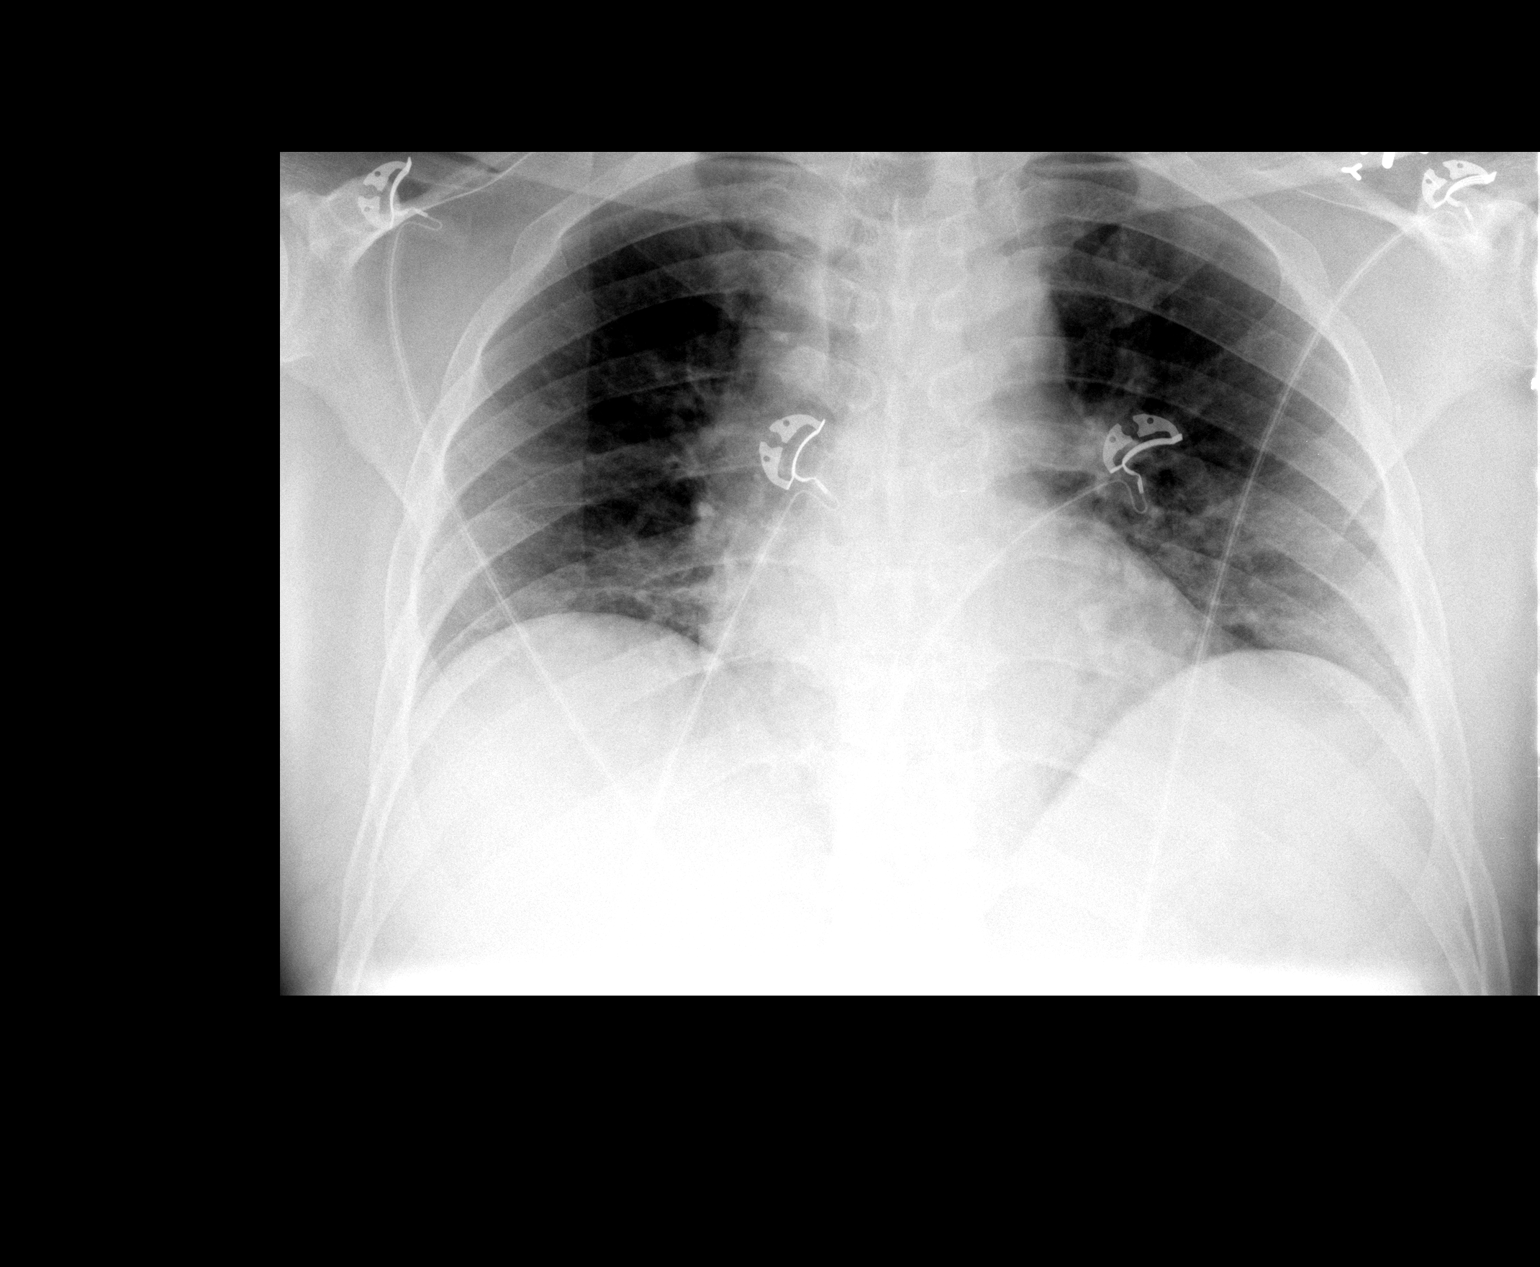

[1 of 1 positions shown; findings below may reference images not displayed]

FINDINGS: The lungs are hypoexpanded, with mild bibasilar
atelectasis.  Mild vascular crowding is noted.  No pleural effusion
or pneumothorax is seen.

The cardiomediastinal silhouette is normal in size, given
technique.  No acute osseous abnormalities are identified.
IMPRESSION: Hypoexpanded lungs with mild bibasilar atelectasis.  No displaced
rib fractures seen.

## 2010-12-18 IMAGING — CR DG FOREARM 2V*R*
2 series · 2 of 2 positions shown · non-contrast
Comparison: None.

CLINICAL DATA: Status post fall onto arm, with wrist and elbow
pain.

RIGHT FOREARM - 2 VIEW

[x forearm lat right]
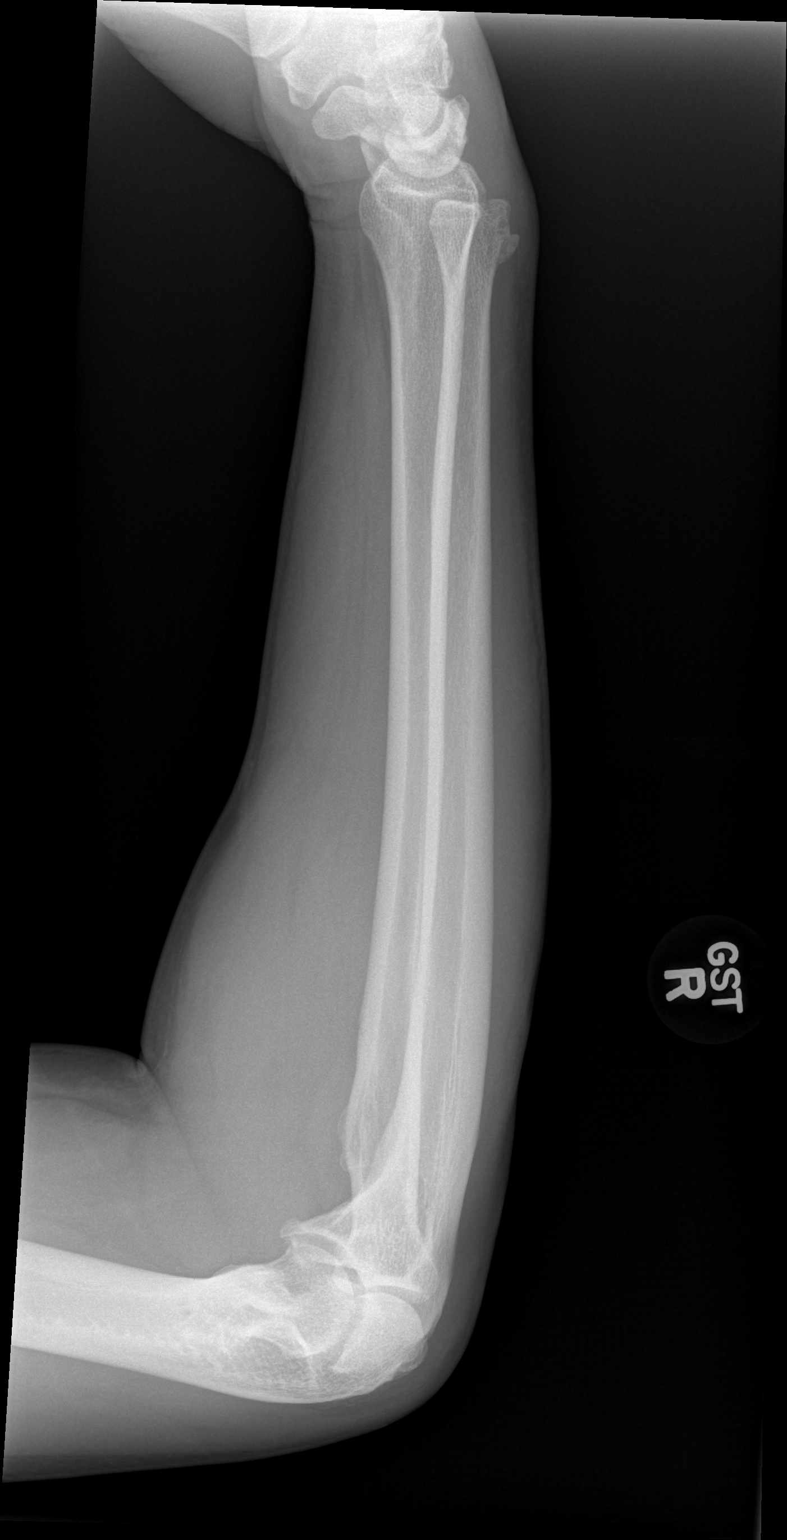

[x forearm ap right]
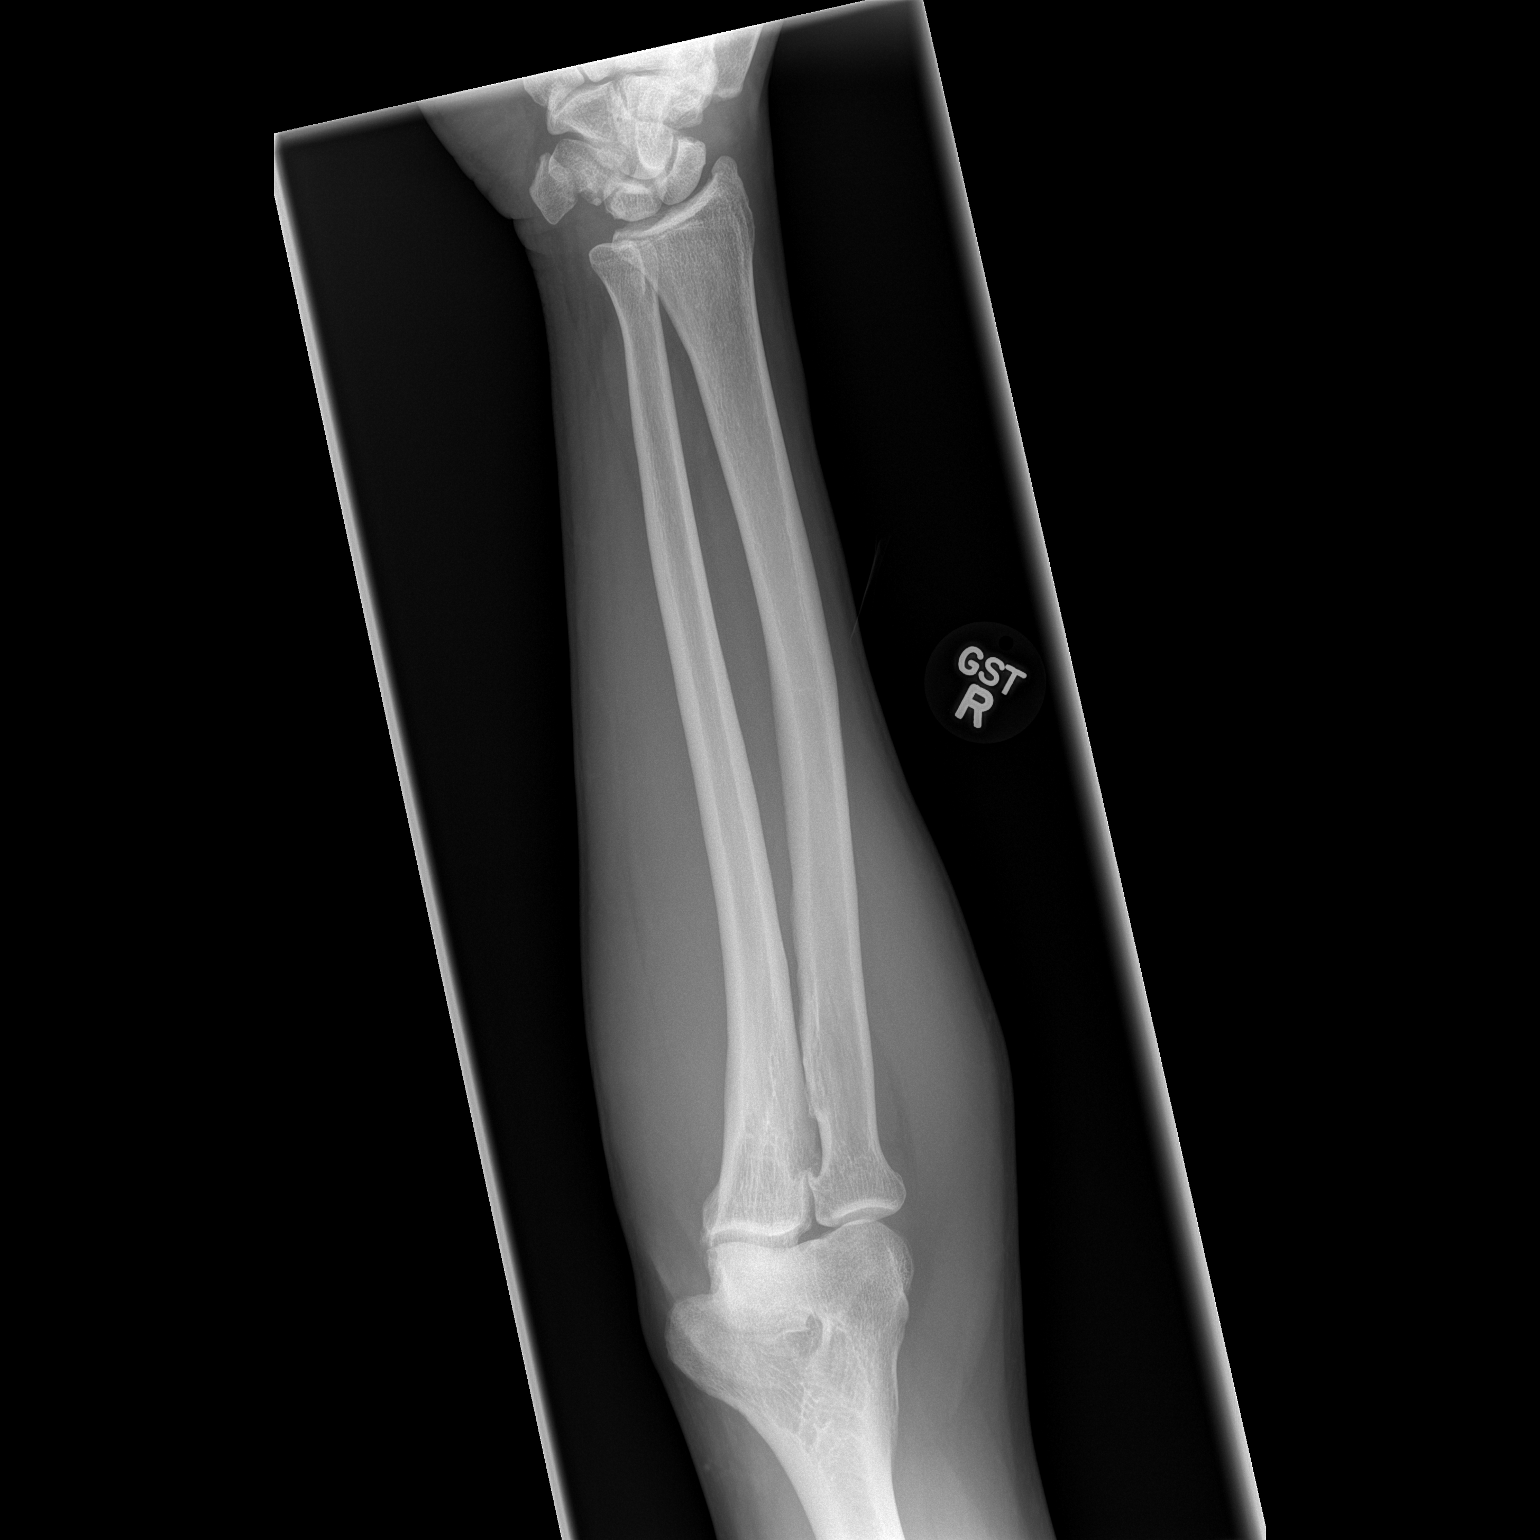

[2 of 2 positions shown; findings below may reference images not displayed]

FINDINGS: There is no evidence of fracture or dislocation.  The
radius and ulna appear intact.  No joint effusion is identified at
the elbow.  The carpal rows are not fully assessed, but appear
grossly unremarkable.  Osteophyte formation is noted at the
coronoid.
IMPRESSION: No evidence of fracture or dislocation.

## 2010-12-18 IMAGING — CR DG ELBOW COMPLETE 3+V*R*
4 series · 4 of 4 positions shown · non-contrast
Comparison: None

CLINICAL DATA: Status post fall onto arm, with elbow pain.

RIGHT ELBOW - COMPLETE 3+ VIEW

[x elbow joint ap right]
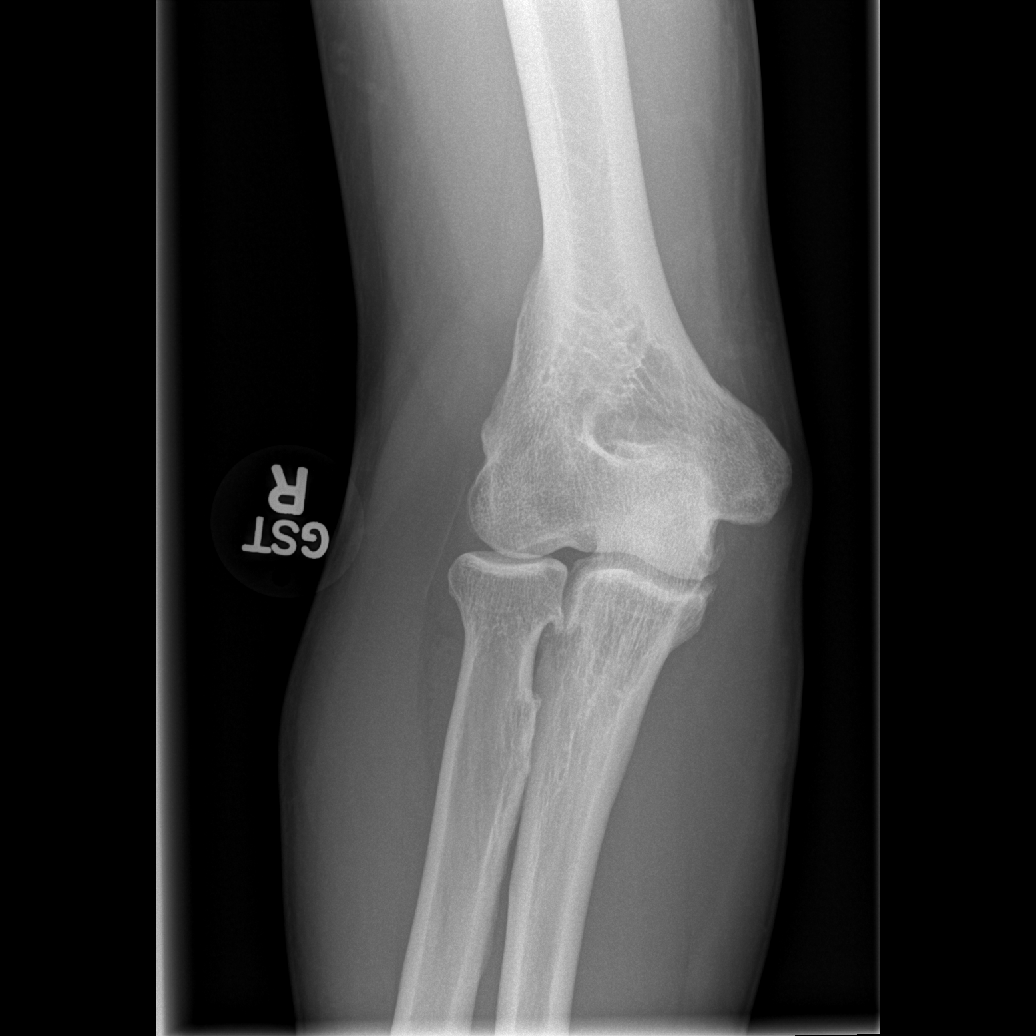

[x elbow joint obl. right (1 of 2)]
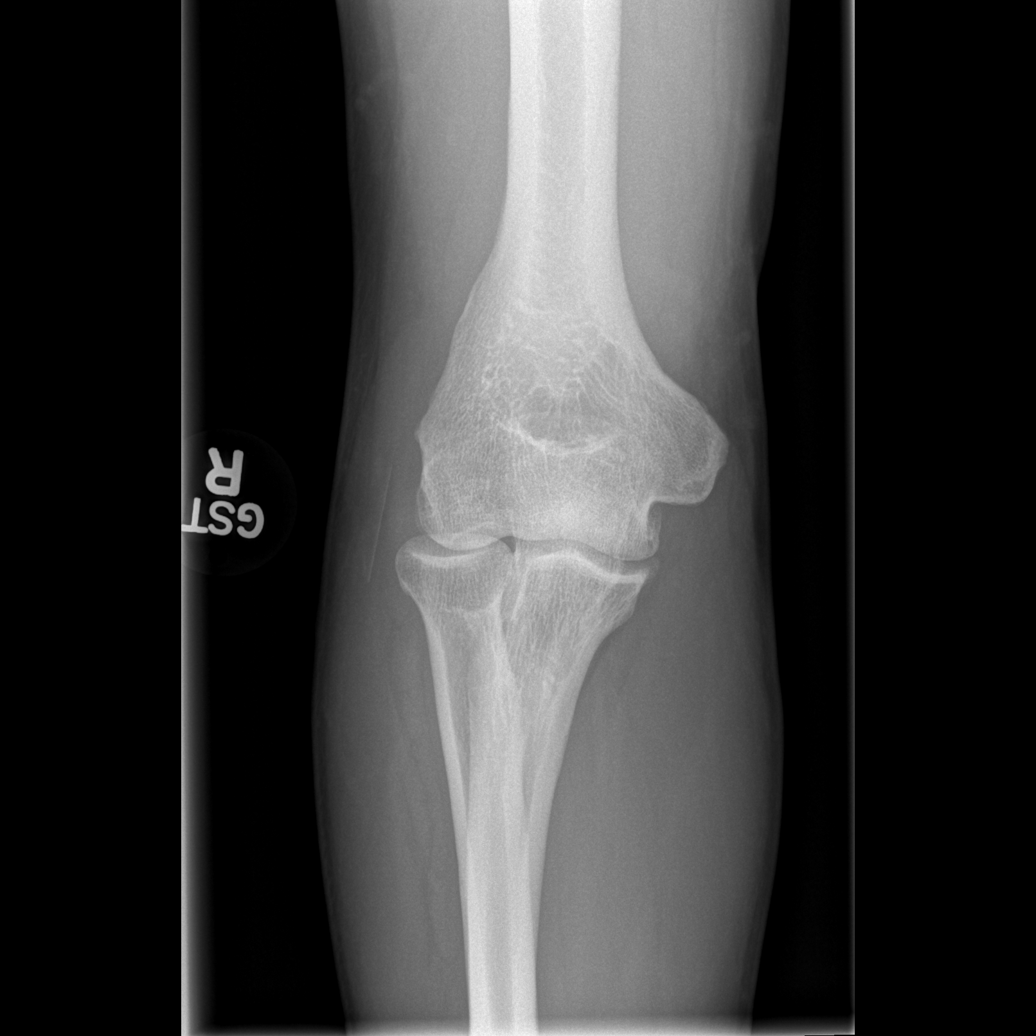

[x elbow joint obl. right (2 of 2)]
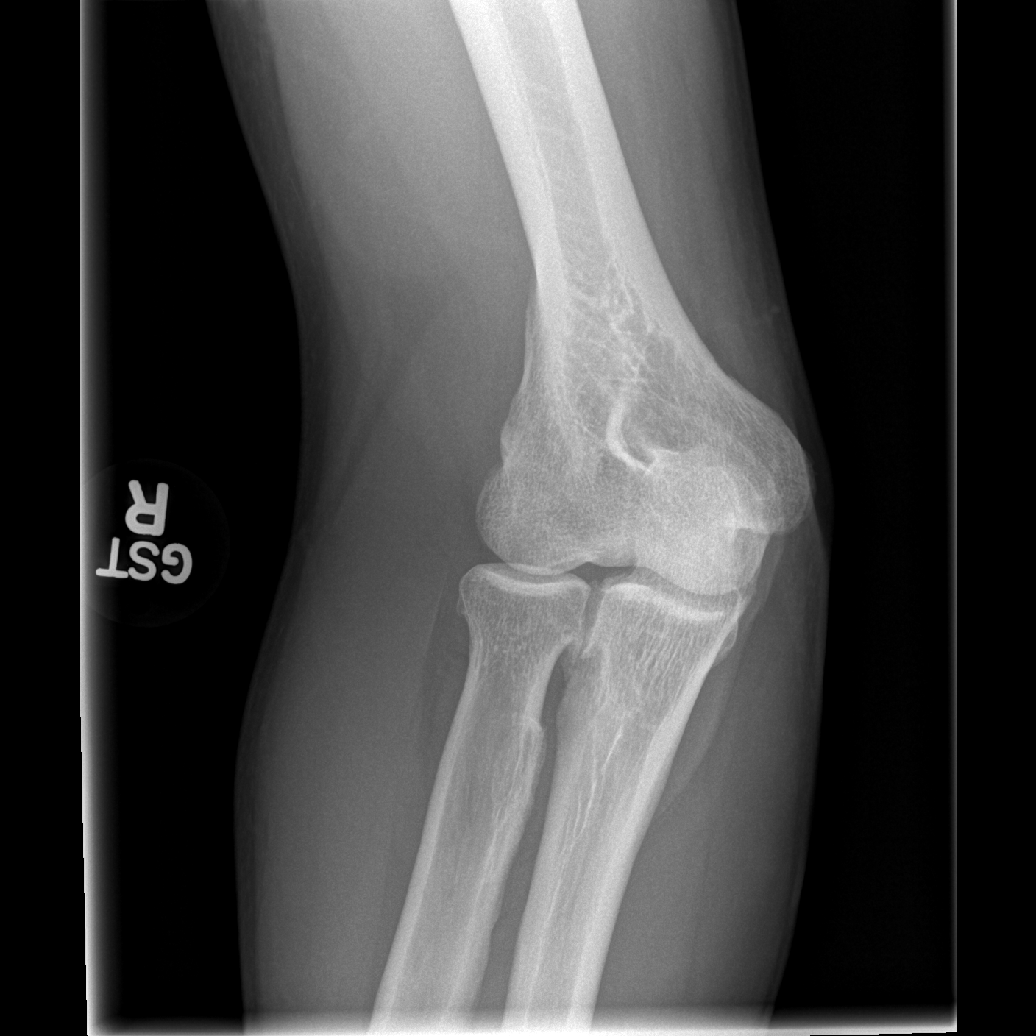

[x elbow joint lat right]
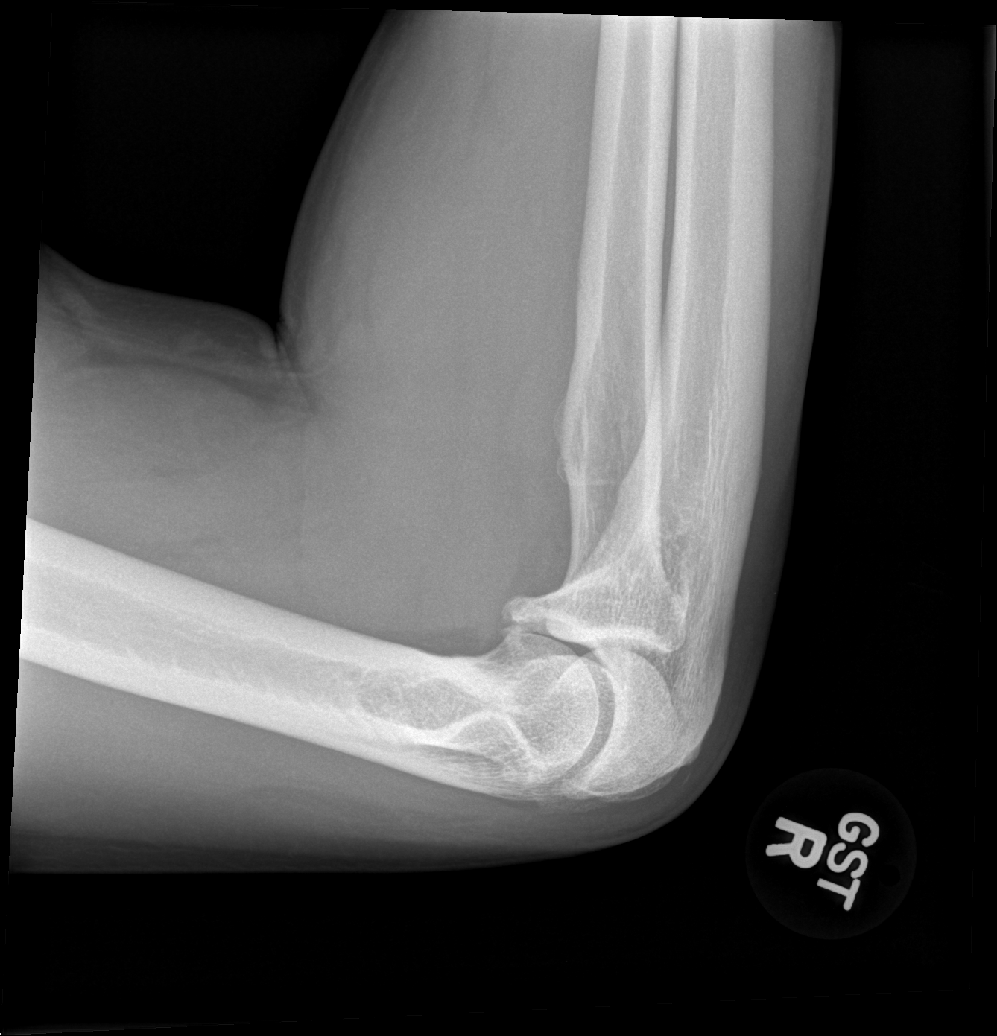

[4 of 4 positions shown; findings below may reference images not displayed]

FINDINGS: There is no evidence of fracture or dislocation.  Mild
osteophyte formation noted at the coronoid.  The visualized joint
spaces are preserved.  No significant joint effusion is identified.
The soft tissues are unremarkable in appearance.
IMPRESSION: No evidence of fracture or dislocation.

## 2010-12-18 IMAGING — CR DG WRIST COMPLETE 3+V*R*
4 series · 4 of 4 positions shown · non-contrast
Comparison: None.

CLINICAL DATA: Status post fall onto arm, with dorsal wrist pain
radiating into the metacarpals.

RIGHT WRIST - COMPLETE 3+ VIEW

[x wrist pa right]
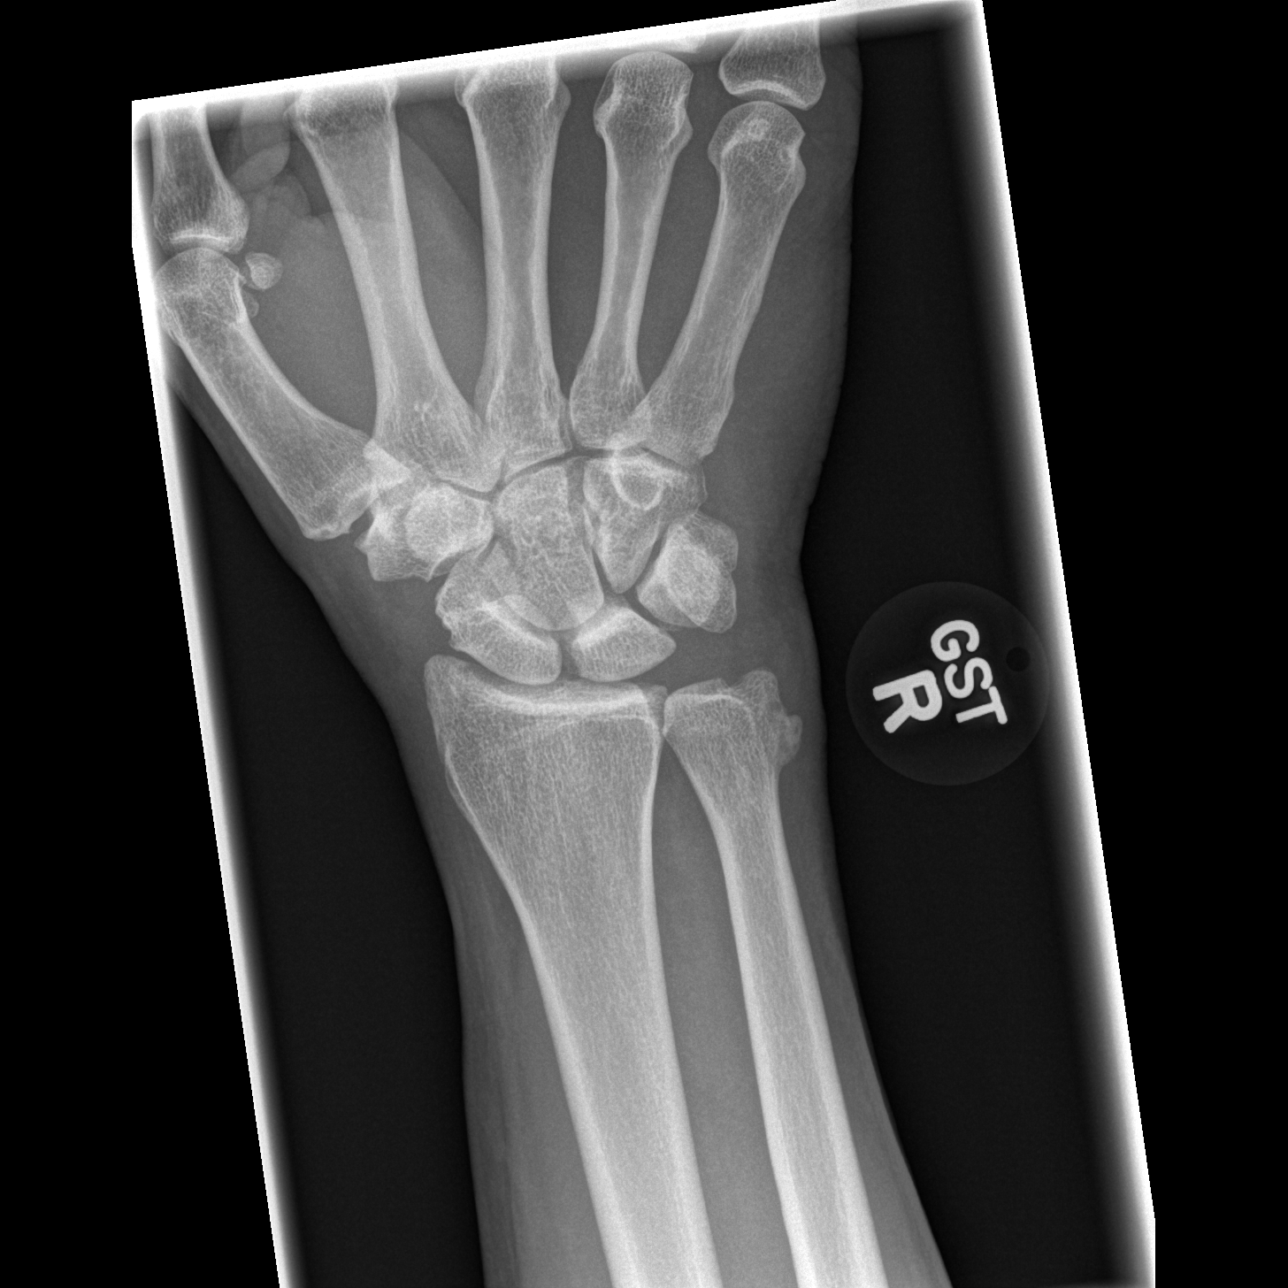

[x wrist obl right]
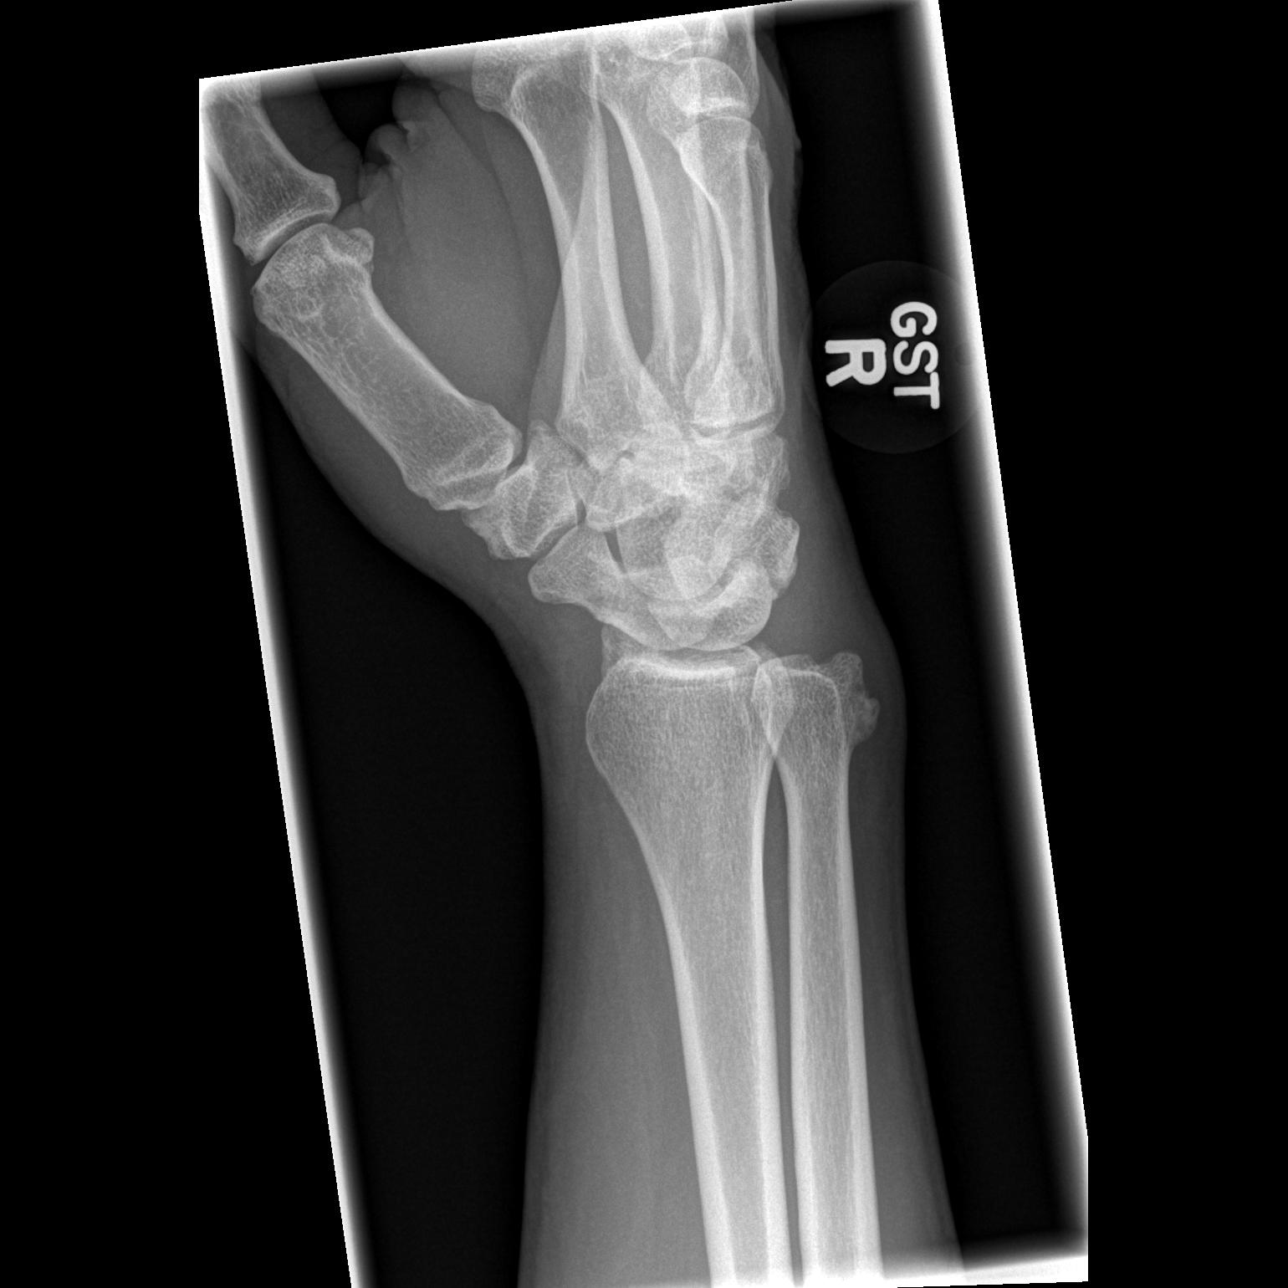

[x wrist lat right]
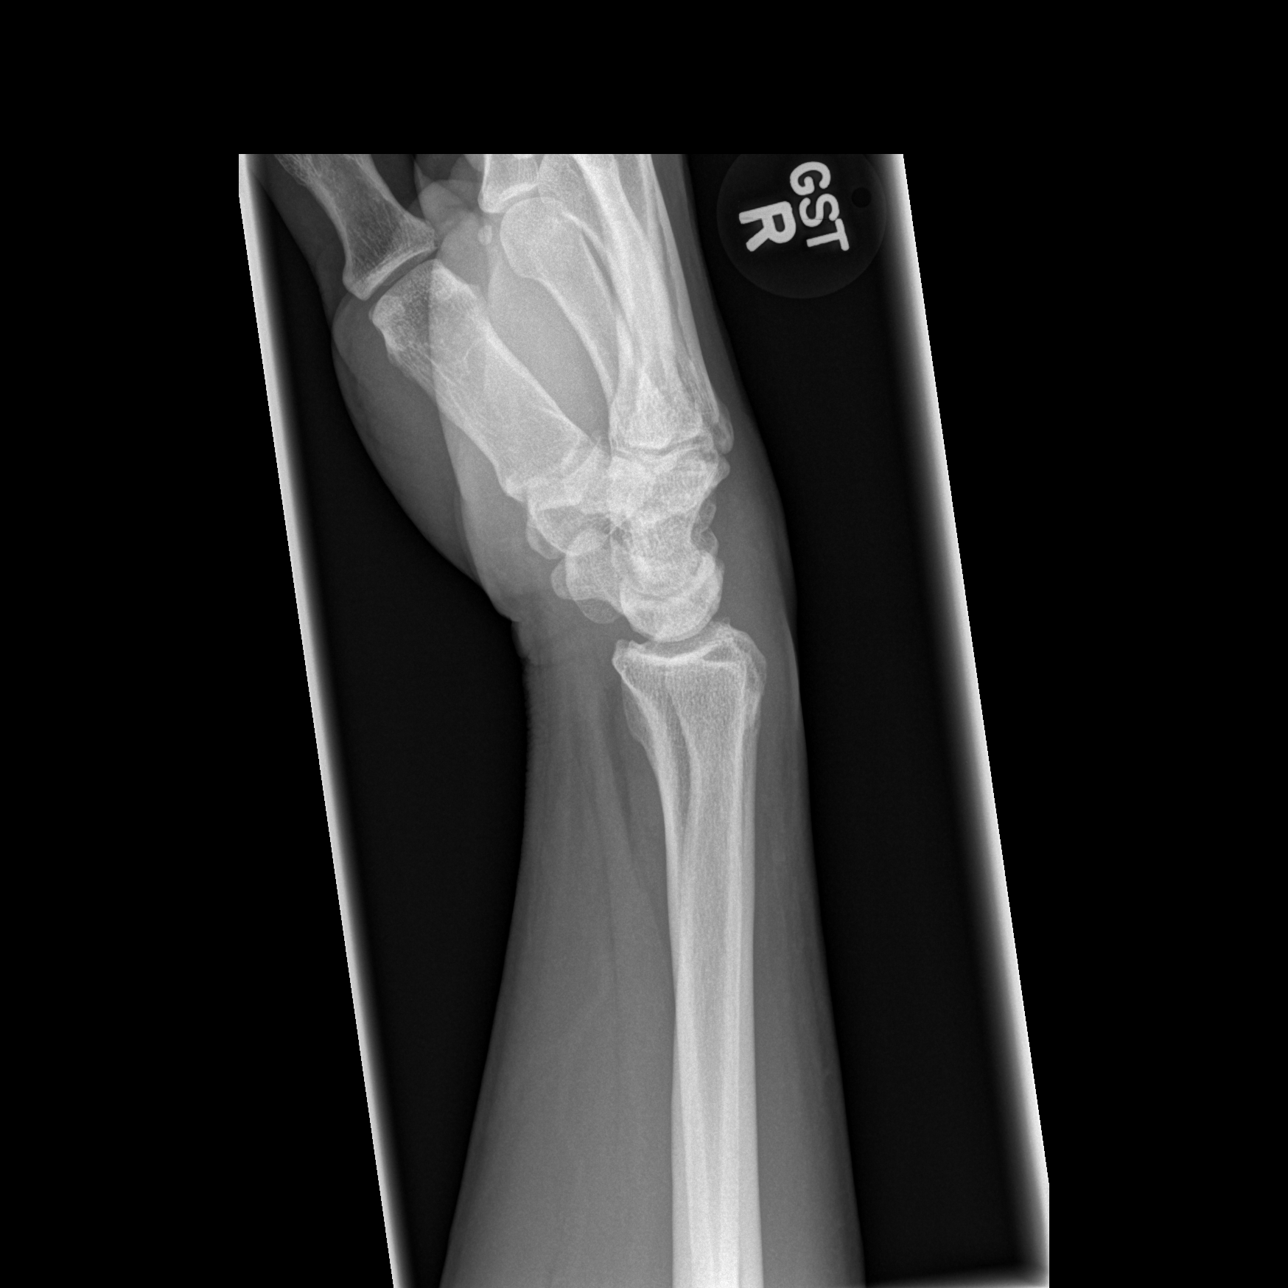

[x navicular]
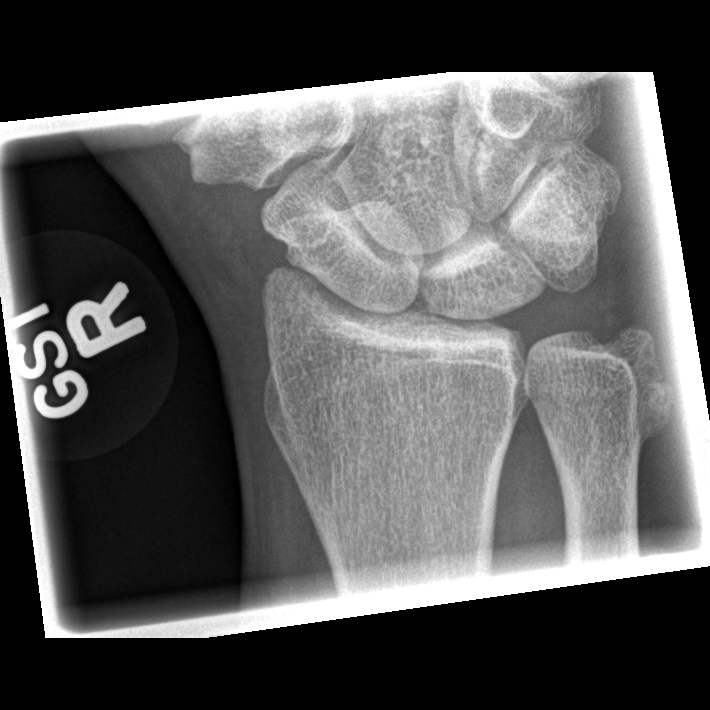

[4 of 4 positions shown; findings below may reference images not displayed]

FINDINGS: There is no evidence of fracture or dislocation.  The
carpal rows are intact, and demonstrate normal alignment.  The
joint spaces are preserved. There is mild chronic deformity of the
distal ulna.

No significant soft tissue abnormalities are seen.
IMPRESSION: No evidence of fracture or dislocation.

## 2010-12-27 ENCOUNTER — Inpatient Hospital Stay (HOSPITAL_COMMUNITY)
Admission: EM | Admit: 2010-12-27 | Discharge: 2010-12-28 | DRG: 313 | Disposition: A | Discharge status: Home / Self Care | Payer: Self-pay | Attending: Cardiology | Admitting: Cardiology

## 2010-12-27 ENCOUNTER — Emergency Department (HOSPITAL_COMMUNITY): Payer: Self-pay

## 2010-12-27 DIAGNOSIS — R0789 Other chest pain: Principal | ICD-10-CM | POA: Diagnosis present

## 2010-12-27 DIAGNOSIS — M109 Gout, unspecified: Secondary | ICD-10-CM | POA: Diagnosis present

## 2010-12-27 DIAGNOSIS — F3289 Other specified depressive episodes: Secondary | ICD-10-CM | POA: Diagnosis present

## 2010-12-27 DIAGNOSIS — IMO0002 Reserved for concepts with insufficient information to code with codable children: Secondary | ICD-10-CM | POA: Diagnosis present

## 2010-12-27 DIAGNOSIS — R079 Chest pain, unspecified: Secondary | ICD-10-CM

## 2010-12-27 DIAGNOSIS — I10 Essential (primary) hypertension: Secondary | ICD-10-CM | POA: Diagnosis present

## 2010-12-27 DIAGNOSIS — I441 Atrioventricular block, second degree: Secondary | ICD-10-CM | POA: Diagnosis present

## 2010-12-27 DIAGNOSIS — F1210 Cannabis abuse, uncomplicated: Secondary | ICD-10-CM | POA: Diagnosis present

## 2010-12-27 DIAGNOSIS — E785 Hyperlipidemia, unspecified: Secondary | ICD-10-CM | POA: Diagnosis present

## 2010-12-27 DIAGNOSIS — Z7982 Long term (current) use of aspirin: Secondary | ICD-10-CM

## 2010-12-27 DIAGNOSIS — F329 Major depressive disorder, single episode, unspecified: Secondary | ICD-10-CM | POA: Diagnosis present

## 2010-12-27 DIAGNOSIS — Z79899 Other long term (current) drug therapy: Secondary | ICD-10-CM

## 2010-12-27 DIAGNOSIS — R7309 Other abnormal glucose: Secondary | ICD-10-CM | POA: Diagnosis present

## 2010-12-27 DIAGNOSIS — I2510 Atherosclerotic heart disease of native coronary artery without angina pectoris: Secondary | ICD-10-CM | POA: Diagnosis present

## 2010-12-27 LAB — COMPREHENSIVE METABOLIC PANEL
Albumin: 4 g/dL (ref 3.5–5.2)
BUN: 10 mg/dL (ref 6–23)
Calcium: 9.1 mg/dL (ref 8.4–10.5)
Creatinine, Ser: 0.99 mg/dL (ref 0.4–1.5)
GFR calc Af Amer: >60 mL/min (ref >60)
Total Bilirubin: 0.5 mg/dL (ref 0.3–1.2)
Total Protein: 7.1 g/dL (ref 6.0–8.3)

## 2010-12-27 LAB — DIFFERENTIAL
Basophils Absolute: 0 10*3/uL (ref 0.0–0.1)
Basophils Relative: 0 % (ref 0–1)
Eosinophils Absolute: 0 10*3/uL (ref 0.0–0.7)
Eosinophils Relative: 0 % (ref 0–5)
Lymphocytes Relative: 41 % (ref 12–46)
Monocytes Absolute: 0.3 10*3/uL (ref 0.1–1.0)

## 2010-12-27 LAB — CBC
HCT: 45.8 % (ref 39.0–52.0)
MCHC: 33.6 g/dL (ref 30.0–36.0)
MCV: 87.4 fL (ref 78.0–100.0)
Platelets: 220 10*3/uL (ref 150–400)
RDW: 13.3 % (ref 11.5–15.5)
WBC: 4.7 10*3/uL (ref 4.0–10.5)

## 2010-12-27 LAB — RAPID URINE DRUG SCREEN, HOSP PERFORMED
Benzodiazepines: NOT DETECTED
Cocaine: NOT DETECTED
Tetrahydrocannabinol: POSITIVE — AB

## 2010-12-27 LAB — BASIC METABOLIC PANEL
BUN: 10 mg/dL (ref 6–23)
CO2: 20 mEq/L (ref 19–32)
Glucose, Bld: 98 mg/dL (ref 70–99)
Potassium: 4 mEq/L (ref 3.5–5.1)
Sodium: 136 mEq/L (ref 135–145)

## 2010-12-27 LAB — POCT CARDIAC MARKERS: Myoglobin, poc: 88 ng/mL (ref 12–200)

## 2010-12-27 LAB — CK TOTAL AND CKMB (NOT AT ARMC)
CK, MB: 4.6 ng/mL — ABNORMAL HIGH (ref 0.3–4.0)
Relative Index: 1.2 (ref 0.0–2.5)
Total CK: 381 U/L — ABNORMAL HIGH (ref 7–232)

## 2010-12-27 LAB — BRAIN NATRIURETIC PEPTIDE: Pro B Natriuretic peptide (BNP): <30 pg/mL (ref 0.0–100.0)

## 2010-12-27 LAB — TROPONIN I: Troponin I: 0.02 ng/mL (ref 0.00–0.06)

## 2010-12-28 DIAGNOSIS — R079 Chest pain, unspecified: Secondary | ICD-10-CM

## 2010-12-28 LAB — CARDIAC PANEL(CRET KIN+CKTOT+MB+TROPI)
Relative Index: 1.3 (ref 0.0–2.5)
Troponin I: <0.01 ng/mL (ref 0.00–0.06)
Troponin I: <0.01 ng/mL (ref 0.00–0.06)

## 2011-01-04 LAB — CREATININE, SERUM
Creatinine, Ser: 1.29 mg/dL (ref 0.4–1.5)
GFR calc Af Amer: >60 mL/min (ref >60)

## 2011-01-04 NOTE — H&P (Signed)
NAME:  Russell Carter, Russell Carter               ACCOUNT NO.:  [id number]  MEDICAL RECORD NO.:  [id number]           PATIENT TYPE:  E  LOCATION:  [facility]                         FACILITY:  [facility]  PHYSICIAN:  Rollene Rotunda, MD, FACCDATE OF BIRTH:  Jan 19, 1956  DATE OF ADMISSION:  12/27/2010 DATE OF DISCHARGE:                             HISTORY & PHYSICAL   PRIMARY CARE PHYSICIAN:  [facility].  CARDIOLOGIST:  Noralyn Pick. Eden Emms, MD, [facility]  REASON FOR PRESENTATION:  Evaluate the patient with chest pain.  HISTORY OF PRESENT ILLNESS:  The patient is pleasant a 55 year old African American gentleman with a history of nonobstructive coronary artery disease as described below.  Today, he lost his job.  He was upset about this.  He went home.  At rest, he developed some sharp discomfort under his left breast.  It was intense.  It was worse with deep breathing.  It was not positional.  It was not radiating to his jaw or to his arms.  There was no associated nausea, vomiting, or diaphoresis.  It might have been in slightly short of breath and had some dizziness.  It was the same as his previous discomfort.  He did take nitroglycerin.  He felt some improvement.  He has had nitroglycerin, here a nitroglycerin paste, and his discomfort is down to 1/10.  EKG in the emergency room was negative for any acute changes.  He does have Mobitz type 1.  This has been documented before.  He has had negative point-of-care markers x1, and negative enzymes x1.  He does have a history of polysubstance abuse with cocaine, but his drug screen today was negative for cocaine but positive for marijuana.  He says he has been around marijuana smokers, but has not been smoking himself.  He otherwise has been feeling well.  He works hard when he was working with vigorous activity and has not been able to bring on any chest pain in the days leading up to this.  He has had abnormal exercise tolerance.  PAST MEDICAL HISTORY:   Nonobstructive coronary artery disease (catheterization May 2011 with LAD 50-60% stenosis, OM 50% stenosis, and a normal LV), hypertension, hyperlipidemia, hyperglycemia, gout, vertigo, depression, degenerative disk disease.  PAST SURGICAL HISTORY:  Left rotator cuff surgery, left clavicular surgery, bilateral inguinal hernia repair.  ALLERGIES:  None.  MEDICATIONS: 1. Aspirin 81 mg daily. 2. Lisinopril 40 mg daily. 3. Pravastatin 20 mg daily.  SOCIAL HISTORY:  The patient is married.  He has six children.  He had previous polysubstance abuse as stated above.  He has never smoked cigarettes.  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  FAMILY HISTORY:  Contributory for brother with a myocardial infarction at age 45.  His parents had later onset heart disease.  REVIEW OF SYSTEMS:  Positive for low back pain.  Negative for all other systems.  PHYSICAL EXAMINATION:  GENERAL:  The patient is in no distress. VITAL SIGNS:  Blood pressure 175/96, heart rate 65 and regular, respiratory rate 18, 99% saturation on room air. HEENT:  Eyelids are unremarkable.  Pupils are equal, round, and reactive to  light.  Fundi are not visualized.  Oral mucosa unremarkable, poor dentition. NECK:  No jugular venous distention at 45 degrees.  Carotid upstroke brisk and symmetric.  No bruits.  No thyromegaly. LYMPHATICS:  No cervical, axillary, or inguinal adenopathy. LUNGS:  Clear to auscultation bilaterally. BACK:  No costovertebral angle tenderness. CHEST:  Unremarkable. HEART:  PMI not displaced or sustained.  S1 and S2 within normal limits. No S3, no S4, no clicks, no rubs, no murmurs. ABDOMEN:  Flat, positive bowel sounds, normal frequency and pitch.  No bruits, rebound, guarding.  No midline pulsatile mass.  No hepatomegaly. No splenomegaly. SKIN:  No rashes.  No nodules. EXTREMITIES:  Pulses 2+ throughout.  No edema, no cyanosis, no clubbing. NEURO:  Oriented to  person, place, time, cranial nerves II through XII grossly intact.  Motor grossly intact.  EKG, sinus rhythm, Mobitz type 1, axis within normal limits, intervals within normal limits, poor anterior R-wave progression, which is different than previous EKGs but could be lead placement.  ASSESSMENT AND PLAN: 1. Chest:  The patient's chest discomfort has predominately atypical     features.  It is the same as the pain he has had at the time of his     other catheterizations when he has had nonobstructive disease.     There is no objective evidence of ischemia.  At this point, I would     suggest ruling out for myocardial infarction.  If his enzymes are     negative and he has no EKG changes, I would not suggest further     cardiovascular testing.  He needs primary risk reduction. 2. Mobitz type 1.  He has no symptoms related to this.  He will be     observed on telemetry. 3. Hypertension.  His blood pressure is elevated today.  I will     continue his lisinopril and see if he needs up titration of this. 4. Polysubstance abuse.  He claims not to be using any substances,     though his marijuana was positive.  We will continue to educate.5. Dyslipidemia.  He will continue with his current statin.  LABORATORY DATA:  Point-of-care markers negative x1, troponins negative x1, BNP less than 30, sodium 36, potassium 4.0, BUN 10, creatinine 1.06. WBC 4.7, hemoglobin 15.4, platelets 220.     Rollene Rotunda, MD, [facility]     JH/MEDQ  D:  12/27/2010  T:  12/27/2010  Job:  [id number]  Electronically Signed by Rollene Rotunda MD [facility] on 01/04/2011 08:00:23 PM

## 2011-01-08 LAB — GLUCOSE, CAPILLARY
Glucose-Capillary: 110 mg/dL — ABNORMAL HIGH (ref 70–99)
Glucose-Capillary: 120 mg/dL — ABNORMAL HIGH (ref 70–99)
Glucose-Capillary: 141 mg/dL — ABNORMAL HIGH (ref 70–99)
Glucose-Capillary: 152 mg/dL — ABNORMAL HIGH (ref 70–99)
Glucose-Capillary: 173 mg/dL — ABNORMAL HIGH (ref 70–99)

## 2011-01-08 LAB — COMPREHENSIVE METABOLIC PANEL
AST: 25 U/L (ref 0–37)
Albumin: 3.3 g/dL — ABNORMAL LOW (ref 3.5–5.2)
Calcium: 8.2 mg/dL — ABNORMAL LOW (ref 8.4–10.5)
Creatinine, Ser: 1.3 mg/dL (ref 0.4–1.5)
GFR calc Af Amer: >60 mL/min (ref >60)
GFR calc non Af Amer: 57 mL/min — ABNORMAL LOW (ref >60)

## 2011-01-08 LAB — LIPID PANEL
HDL: 49 mg/dL (ref >39)
Total CHOL/HDL Ratio: 4 RATIO
Triglycerides: 213 mg/dL — ABNORMAL HIGH (ref <150)
VLDL: 43 mg/dL — ABNORMAL HIGH (ref 0–40)

## 2011-01-08 LAB — BASIC METABOLIC PANEL
BUN: 12 mg/dL (ref 6–23)
Calcium: 8.7 mg/dL (ref 8.4–10.5)
Creatinine, Ser: 1.14 mg/dL (ref 0.4–1.5)
GFR calc Af Amer: >60 mL/min (ref >60)
GFR calc non Af Amer: >60 mL/min (ref >60)
Glucose, Bld: 121 mg/dL — ABNORMAL HIGH (ref 70–99)
Potassium: 3.3 mEq/L — ABNORMAL LOW (ref 3.5–5.1)
Sodium: 137 mEq/L (ref 135–145)

## 2011-01-08 LAB — CBC
HCT: 35.9 % — ABNORMAL LOW (ref 39.0–52.0)
Hemoglobin: 12.6 g/dL — ABNORMAL LOW (ref 13.0–17.0)
MCV: 91.2 fL (ref 78.0–100.0)
Platelets: 211 10*3/uL (ref 150–400)
Platelets: 230 10*3/uL (ref 150–400)
RBC: 3.85 MIL/uL — ABNORMAL LOW (ref 4.22–5.81)
RBC: 3.92 MIL/uL — ABNORMAL LOW (ref 4.22–5.81)
RDW: 14.4 % (ref 11.5–15.5)
WBC: 7.1 10*3/uL (ref 4.0–10.5)
WBC: 7.4 10*3/uL (ref 4.0–10.5)

## 2011-01-08 LAB — HEMOGLOBIN A1C: Hgb A1c MFr Bld: 5.9 % — ABNORMAL HIGH (ref <5.7)

## 2011-01-08 LAB — CARDIAC PANEL(CRET KIN+CKTOT+MB+TROPI)
CK, MB: 1.7 ng/mL (ref 0.3–4.0)
Relative Index: 1 (ref 0.0–2.5)
Troponin I: 0.01 ng/mL (ref 0.00–0.06)

## 2011-01-08 LAB — POCT I-STAT, CHEM 8
BUN: 13 mg/dL (ref 6–23)
Calcium, Ion: 1.13 mmol/L (ref 1.12–1.32)
Chloride: 109 mEq/L (ref 96–112)
Glucose, Bld: 134 mg/dL — ABNORMAL HIGH (ref 70–99)

## 2011-01-08 LAB — TSH: TSH: 2.084 u[IU]/mL (ref 0.350–4.500)

## 2011-01-08 LAB — URINE CULTURE

## 2011-01-08 LAB — URINALYSIS, ROUTINE W REFLEX MICROSCOPIC
Bilirubin Urine: NEGATIVE
Glucose, UA: NEGATIVE mg/dL
Ketones, ur: NEGATIVE mg/dL
pH: 6 (ref 5.0–8.0)

## 2011-01-08 LAB — RAPID URINE DRUG SCREEN, HOSP PERFORMED
Amphetamines: NOT DETECTED
Benzodiazepines: NOT DETECTED

## 2011-01-08 LAB — POCT CARDIAC MARKERS: Troponin i, poc: <0.05 ng/mL (ref 0.00–0.09)

## 2011-01-08 LAB — TROPONIN I: Troponin I: 0.01 ng/mL (ref 0.00–0.06)

## 2011-01-08 LAB — PROTIME-INR: INR: 1.07 (ref 0.00–1.49)

## 2011-01-08 LAB — APTT: aPTT: 22 seconds — ABNORMAL LOW (ref 24–37)

## 2011-01-08 LAB — CK TOTAL AND CKMB (NOT AT ARMC): Total CK: 179 U/L (ref 7–232)

## 2011-01-08 NOTE — Discharge Summary (Signed)
NAME:  Carter, Russell               ACCOUNT NO.:  [id number]  MEDICAL RECORD NO.:  [id number]           PATIENT TYPE:  I  LOCATION:  3703                         FACILITY:  [facility]  PHYSICIAN:  Marry Kusch C. Eden Emms, MD, FACCDATE OF BIRTH:  1956/04/16  DATE OF ADMISSION:  12/27/2010 DATE OF DISCHARGE:  12/28/2010                              DISCHARGE SUMMARY   PRIMARY CARDIOLOGIST:  [name] C. Eden Emms, MD, [facility]  PRIMARY CARE PROVIDER:  [facility].  DISCHARGE DIAGNOSIS:  Atypical chest pain, the patient ruled out for myocardial infarction on this admission.  SECONDARY DIAGNOSES: 1. Nonobstructive coronary artery disease with cardiac catheterization     in May 2011, left anterior descending 50-60% stenosis, obtuse     marginal 50% stenosis.  Normal left ventricular function. 2. Hypertension. 3. Hyperlipidemia. 4. Hyperglycemia. 5. Gout. 6. Depression.  PROCEDURES/DIAGNOSTICS PERFORMED DURING HOSPITALIZATION:  Chest x-ray on December 27, 2010, demonstrating low lung volumes with vascular congestion and bibasilar atelectasis.  ALLERGIES:  No known drug allergies.  REASON FOR HOSPITALIZATION:  This is a 55 year old gentleman with the above-stated problem list who states he was in his usual state of health until the day of admission when he lost his job and was at home and developed sharp discomfort under his left breast.  This was worse with the inspiration, although not positional.  There was no radiation.  No associated nausea, vomiting, or diaphoresis.  He did complain of slight shortness of breath and dizziness.  The pain was similar to prior discomfort as well as some improvement with nitroglycerin.  The patient presented to the [facility] where EKG showed no acute changes, but did have Mobitz type 1.  Point-of-care markers were negative x1 and negative cardiac enzymes x1 upon evaluation.  The patient has a history of substance abuse with cocaine, but currently urine  drug screen was negative for cocaine.  The patient denies any abnormal exercise tolerance.  The patient was admitted for overnight observation.  HOSPITAL COURSE:  The patient was monitored on telemetry overnight.  He had no further complaints of chest pain, but continued to be depression about the loss of his job.  The patient ruled out for myocardial infarction with cardiac enzymes being negative x3.  With the patient's atypical chest pain and ruling out for myocardial infarction as well as a normal EKG, Dr. Eden Emms felt the patient stable for discharged with outpatient followup.  There has been education concerning marijuana use as his urine drug screen was positive.  The patient has also been informed to make an appointment with [facility] for further follow up of his depression.  Discharge plans and instructions have been discussed with the patient and he voices understanding.  DISCHARGE LABORATORY DATA:  Cardiac enzymes negative x3.  Sodium 135, potassium 3.9, BUN 10, and creatinine 0.99.  DISCHARGE MEDICATIONS: 1. Carvedilol 3.125 mg 1 tablet twice daily. 2. Indomethacin 50 mg daily. 3. Lisinopril 40 mg daily. 4. Meclizine 25 mg daily. 5. Nitroglycerin 0.4 mg tablets sublingual 1 tablet under tongue every     5 minutes up to 3 doses as needed for chest pain. 6. Pravastatin 20  mg daily.  DISCHARGE PLAN AND INSTRUCTIONS: 1. The patient is to follow up with Dr. Eden Emms on March 29, 2011, at     10:30 a.m., he is to call office in the interim if he has any     questions or concerns. 2. The patient is to follow up with [facility] as previously     scheduled or as needed. 3. The patient is to increase activity slowly.  He is to stop any     activity that causes chest pain or shortness of breath. 4. The patient is to continue a low-sodium, heart-healthy diet.  DURATION OF DISCHARGE:  Greater than 30 minutes with physician and physician extender time.     Leonette Monarch,  PA-C   ______________________________ Noralyn Pick Eden Emms, MD, [facility]    NB/MEDQ  D:  12/28/2010  T:  12/29/2010  Job:  [id number]  cc:   [facility] Noralyn Pick. Eden Emms, MD, [facility]  Electronically Signed by Alen Blew P.A. on 01/01/2011 12:42:28 PM Electronically Signed by Charlton Haws MD [facility] on 01/08/2011 03:09:00 PM

## 2011-01-10 LAB — CBC
MCHC: 34 g/dL (ref 30.0–36.0)
MCV: 94.7 fL (ref 78.0–100.0)
RDW: 12.6 % (ref 11.5–15.5)
WBC: 6.4 10*3/uL (ref 4.0–10.5)

## 2011-01-10 LAB — HEPATIC FUNCTION PANEL
Alkaline Phosphatase: 40 U/L (ref 39–117)
Indirect Bilirubin: 0.2 mg/dL — ABNORMAL LOW (ref 0.3–0.9)
Total Bilirubin: 0.5 mg/dL (ref 0.3–1.2)
Total Protein: 6.9 g/dL (ref 6.0–8.3)

## 2011-01-10 LAB — PROTIME-INR: Prothrombin Time: 13.5 seconds (ref 11.6–15.2)

## 2011-01-10 LAB — BASIC METABOLIC PANEL
BUN: 15 mg/dL (ref 6–23)
Calcium: 8.8 mg/dL (ref 8.4–10.5)
GFR calc non Af Amer: 49 mL/min — ABNORMAL LOW (ref >60)
Potassium: 4.4 mEq/L (ref 3.5–5.1)
Sodium: 137 mEq/L (ref 135–145)

## 2011-01-10 LAB — GLUCOSE, CAPILLARY: Glucose-Capillary: 87 mg/dL (ref 70–99)

## 2011-01-21 ENCOUNTER — Emergency Department (HOSPITAL_COMMUNITY): Payer: Self-pay

## 2011-01-21 ENCOUNTER — Emergency Department (HOSPITAL_COMMUNITY)
Admission: EM | Admit: 2011-01-21 | Discharge: 2011-01-22 | Disposition: A | Discharge status: Home / Self Care | Payer: Self-pay | Attending: Emergency Medicine | Admitting: Emergency Medicine

## 2011-01-21 DIAGNOSIS — E785 Hyperlipidemia, unspecified: Secondary | ICD-10-CM | POA: Insufficient documentation

## 2011-01-21 DIAGNOSIS — R42 Dizziness and giddiness: Secondary | ICD-10-CM | POA: Insufficient documentation

## 2011-01-21 DIAGNOSIS — M79609 Pain in unspecified limb: Secondary | ICD-10-CM | POA: Insufficient documentation

## 2011-01-21 DIAGNOSIS — S63509A Unspecified sprain of unspecified wrist, initial encounter: Secondary | ICD-10-CM | POA: Insufficient documentation

## 2011-01-21 DIAGNOSIS — M25539 Pain in unspecified wrist: Secondary | ICD-10-CM | POA: Insufficient documentation

## 2011-01-21 DIAGNOSIS — I252 Old myocardial infarction: Secondary | ICD-10-CM | POA: Insufficient documentation

## 2011-01-21 DIAGNOSIS — R61 Generalized hyperhidrosis: Secondary | ICD-10-CM | POA: Insufficient documentation

## 2011-01-21 DIAGNOSIS — R7309 Other abnormal glucose: Secondary | ICD-10-CM | POA: Insufficient documentation

## 2011-01-21 DIAGNOSIS — E7800 Pure hypercholesterolemia, unspecified: Secondary | ICD-10-CM | POA: Insufficient documentation

## 2011-01-21 DIAGNOSIS — R209 Unspecified disturbances of skin sensation: Secondary | ICD-10-CM | POA: Insufficient documentation

## 2011-01-21 DIAGNOSIS — I10 Essential (primary) hypertension: Secondary | ICD-10-CM | POA: Insufficient documentation

## 2011-01-21 DIAGNOSIS — W010XXA Fall on same level from slipping, tripping and stumbling without subsequent striking against object, initial encounter: Secondary | ICD-10-CM | POA: Insufficient documentation

## 2011-01-21 DIAGNOSIS — M7989 Other specified soft tissue disorders: Secondary | ICD-10-CM | POA: Insufficient documentation

## 2011-01-21 DIAGNOSIS — M25519 Pain in unspecified shoulder: Secondary | ICD-10-CM | POA: Insufficient documentation

## 2011-01-21 DIAGNOSIS — Y929 Unspecified place or not applicable: Secondary | ICD-10-CM | POA: Insufficient documentation

## 2011-01-21 DIAGNOSIS — S52609A Unspecified fracture of lower end of unspecified ulna, initial encounter for closed fracture: Secondary | ICD-10-CM | POA: Insufficient documentation

## 2011-01-21 DIAGNOSIS — Z79899 Other long term (current) drug therapy: Secondary | ICD-10-CM | POA: Insufficient documentation

## 2011-01-21 LAB — CBC
Hemoglobin: 15.6 g/dL (ref 13.0–17.0)
MCH: 30.8 pg (ref 26.0–34.0)
MCHC: 35 g/dL (ref 30.0–36.0)
MCV: 88.1 fL (ref 78.0–100.0)
Platelets: 200 10*3/uL (ref 150–400)

## 2011-01-21 LAB — RAPID URINE DRUG SCREEN, HOSP PERFORMED
Amphetamines: NOT DETECTED
Opiates: NOT DETECTED
Tetrahydrocannabinol: POSITIVE — AB

## 2011-01-21 LAB — PROTIME-INR
INR: 1.01 (ref 0.00–1.49)
Prothrombin Time: 13.5 seconds (ref 11.6–15.2)

## 2011-01-21 LAB — URINALYSIS, ROUTINE W REFLEX MICROSCOPIC
Bilirubin Urine: NEGATIVE
Glucose, UA: NEGATIVE mg/dL
Ketones, ur: NEGATIVE mg/dL
Protein, ur: NEGATIVE mg/dL
pH: 6 (ref 5.0–8.0)

## 2011-01-21 LAB — TROPONIN I: Troponin I: 0.01 ng/mL (ref 0.00–0.06)

## 2011-01-21 LAB — APTT: aPTT: 26 seconds (ref 24–37)

## 2011-01-21 LAB — COMPREHENSIVE METABOLIC PANEL
ALT: 18 U/L (ref 0–53)
AST: 16 U/L (ref 0–37)
Albumin: 3.7 g/dL (ref 3.5–5.2)
Alkaline Phosphatase: 45 U/L (ref 39–117)
BUN: 10 mg/dL (ref 6–23)
Chloride: 108 mEq/L (ref 96–112)
Potassium: 4.3 mEq/L (ref 3.5–5.1)
Sodium: 138 mEq/L (ref 135–145)
Total Bilirubin: 0.5 mg/dL (ref 0.3–1.2)
Total Protein: 7.3 g/dL (ref 6.0–8.3)

## 2011-01-21 LAB — DIFFERENTIAL
Basophils Relative: 0 % (ref 0–1)
Eosinophils Absolute: 0 10*3/uL (ref 0.0–0.7)
Lymphs Abs: 1 10*3/uL (ref 0.7–4.0)
Monocytes Absolute: 0.8 10*3/uL (ref 0.1–1.0)
Monocytes Relative: 9 % (ref 3–12)

## 2011-01-21 LAB — POCT CARDIAC MARKERS
CKMB, poc: 1.9 ng/mL (ref 1.0–8.0)
Troponin i, poc: <0.05 ng/mL (ref 0.00–0.09)

## 2011-01-21 LAB — CK TOTAL AND CKMB (NOT AT ARMC): Total CK: 184 U/L (ref 7–232)

## 2011-01-22 DIAGNOSIS — I517 Cardiomegaly: Secondary | ICD-10-CM

## 2011-01-22 DIAGNOSIS — R55 Syncope and collapse: Secondary | ICD-10-CM

## 2011-01-22 LAB — LIPID PANEL
Cholesterol: 250 mg/dL — ABNORMAL HIGH (ref 0–200)
LDL Cholesterol: 157 mg/dL — ABNORMAL HIGH (ref 0–99)
Total CHOL/HDL Ratio: 3.3 RATIO
VLDL: 17 mg/dL (ref 0–40)

## 2011-01-22 LAB — CK TOTAL AND CKMB (NOT AT ARMC)
CK, MB: 1.8 ng/mL (ref 0.3–4.0)
Relative Index: 1.2 (ref 0.0–2.5)
Total CK: 152 U/L (ref 7–232)

## 2011-01-22 LAB — GLUCOSE, CAPILLARY: Glucose-Capillary: 126 mg/dL — ABNORMAL HIGH (ref 70–99)

## 2011-01-22 LAB — TROPONIN I: Troponin I: 0.03 ng/mL (ref 0.00–0.06)

## 2011-01-25 LAB — URINALYSIS, ROUTINE W REFLEX MICROSCOPIC
Glucose, UA: NEGATIVE mg/dL
Hgb urine dipstick: NEGATIVE
Ketones, ur: 15 mg/dL — AB
Protein, ur: NEGATIVE mg/dL

## 2011-01-26 LAB — CBC
HCT: 44.3 % (ref 39.0–52.0)
Hemoglobin: 15 g/dL (ref 13.0–17.0)
Hemoglobin: 15.9 g/dL (ref 13.0–17.0)
MCHC: 34.6 g/dL (ref 30.0–36.0)
MCV: 92.2 fL (ref 78.0–100.0)
RDW: 13.1 % (ref 11.5–15.5)
WBC: 6.8 10*3/uL (ref 4.0–10.5)

## 2011-01-26 LAB — COMPREHENSIVE METABOLIC PANEL
ALT: 29 U/L (ref 0–53)
Calcium: 9.3 mg/dL (ref 8.4–10.5)
Creatinine, Ser: 1.07 mg/dL (ref 0.4–1.5)
GFR calc Af Amer: >60 mL/min (ref >60)
Glucose, Bld: 110 mg/dL — ABNORMAL HIGH (ref 70–99)
Sodium: 138 mEq/L (ref 135–145)
Total Protein: 7 g/dL (ref 6.0–8.3)

## 2011-01-26 LAB — CK TOTAL AND CKMB (NOT AT ARMC)
CK, MB: 4.2 ng/mL — ABNORMAL HIGH (ref 0.3–4.0)
Relative Index: 1.8 (ref 0.0–2.5)
Total CK: 131 U/L (ref 7–232)
Total CK: 178 U/L (ref 7–232)
Total CK: 241 U/L — ABNORMAL HIGH (ref 7–232)

## 2011-01-26 LAB — RAPID URINE DRUG SCREEN, HOSP PERFORMED
Amphetamines: NOT DETECTED
Cocaine: POSITIVE — AB
Opiates: POSITIVE — AB
Tetrahydrocannabinol: POSITIVE — AB

## 2011-01-26 LAB — DIFFERENTIAL
Eosinophils Absolute: 0 10*3/uL (ref 0.0–0.7)
Lymphocytes Relative: 34 % (ref 12–46)
Lymphs Abs: 2.4 10*3/uL (ref 0.7–4.0)
Monocytes Relative: 7 % (ref 3–12)
Neutrophils Relative %: 58 % (ref 43–77)

## 2011-01-26 LAB — BASIC METABOLIC PANEL
GFR calc Af Amer: >60 mL/min (ref >60)
GFR calc non Af Amer: >60 mL/min (ref >60)
Potassium: 4 mEq/L (ref 3.5–5.1)
Sodium: 132 mEq/L — ABNORMAL LOW (ref 135–145)

## 2011-01-26 LAB — POCT CARDIAC MARKERS
CKMB, poc: 2.1 ng/mL (ref 1.0–8.0)
Myoglobin, poc: 94 ng/mL (ref 12–200)
Troponin i, poc: <0.05 ng/mL (ref 0.00–0.09)

## 2011-01-26 LAB — TROPONIN I
Troponin I: <0.01 ng/mL (ref 0.00–0.06)
Troponin I: <0.01 ng/mL (ref 0.00–0.06)

## 2011-01-26 LAB — TSH: TSH: 2.061 u[IU]/mL (ref 0.350–4.500)

## 2011-01-26 LAB — T4, FREE: Free T4: 0.94 ng/dL (ref 0.80–1.80)

## 2011-01-30 ENCOUNTER — Emergency Department (HOSPITAL_COMMUNITY)
Admission: EM | Admit: 2011-01-30 | Discharge: 2011-01-30 | Disposition: A | Discharge status: Home / Self Care | Payer: Self-pay | Attending: Emergency Medicine | Admitting: Emergency Medicine

## 2011-01-30 ENCOUNTER — Emergency Department (HOSPITAL_COMMUNITY): Payer: Self-pay

## 2011-01-30 DIAGNOSIS — I252 Old myocardial infarction: Secondary | ICD-10-CM | POA: Insufficient documentation

## 2011-01-30 DIAGNOSIS — Z79899 Other long term (current) drug therapy: Secondary | ICD-10-CM | POA: Insufficient documentation

## 2011-01-30 DIAGNOSIS — E785 Hyperlipidemia, unspecified: Secondary | ICD-10-CM | POA: Insufficient documentation

## 2011-01-30 DIAGNOSIS — M25539 Pain in unspecified wrist: Secondary | ICD-10-CM | POA: Insufficient documentation

## 2011-01-30 DIAGNOSIS — Y929 Unspecified place or not applicable: Secondary | ICD-10-CM | POA: Insufficient documentation

## 2011-01-30 DIAGNOSIS — W010XXA Fall on same level from slipping, tripping and stumbling without subsequent striking against object, initial encounter: Secondary | ICD-10-CM | POA: Insufficient documentation

## 2011-01-30 DIAGNOSIS — I10 Essential (primary) hypertension: Secondary | ICD-10-CM | POA: Insufficient documentation

## 2011-01-30 LAB — BASIC METABOLIC PANEL
Calcium: 8.9 mg/dL (ref 8.4–10.5)
Creatinine, Ser: 1.35 mg/dL (ref 0.4–1.5)
GFR calc Af Amer: >60 mL/min (ref >60)
GFR calc non Af Amer: 55 mL/min — ABNORMAL LOW (ref >60)

## 2011-01-30 LAB — DIFFERENTIAL
Basophils Relative: 1 % (ref 0–1)
Lymphocytes Relative: 44 % (ref 12–46)
Lymphs Abs: 2 10*3/uL (ref 0.7–4.0)
Monocytes Relative: 10 % (ref 3–12)
Neutro Abs: 2 10*3/uL (ref 1.7–7.7)
Neutrophils Relative %: 44 % (ref 43–77)

## 2011-01-30 LAB — CBC
RBC: 4.99 MIL/uL (ref 4.22–5.81)
WBC: 4.4 10*3/uL (ref 4.0–10.5)

## 2011-01-31 ENCOUNTER — Ambulatory Visit: Payer: Self-pay | Attending: Orthopedic Surgery | Admitting: Occupational Therapy

## 2011-01-31 DIAGNOSIS — IMO0001 Reserved for inherently not codable concepts without codable children: Secondary | ICD-10-CM | POA: Insufficient documentation

## 2011-01-31 DIAGNOSIS — R609 Edema, unspecified: Secondary | ICD-10-CM | POA: Insufficient documentation

## 2011-01-31 DIAGNOSIS — M25549 Pain in joints of unspecified hand: Secondary | ICD-10-CM | POA: Insufficient documentation

## 2011-01-31 DIAGNOSIS — M25539 Pain in unspecified wrist: Secondary | ICD-10-CM | POA: Insufficient documentation

## 2011-03-06 NOTE — Cardiovascular Report (Signed)
NAMEJERIMIE, Russell Carter               ACCOUNT NO.:  [id number]   MEDICAL RECORD NO.:  [id number]          PATIENT TYPE:  INP   LOCATION:  2908                         FACILITY:  [facility]   PHYSICIAN:  Noralyn Pick. Eden Emms, MD, FACCDATE OF BIRTH:  08-24-1956   DATE OF PROCEDURE:  DATE OF DISCHARGE:  05/19/2007                            CARDIAC CATHETERIZATION   [name] is a  55 year old patient admitted to the hospital with left-  sided wrist pain.  He had an episode of second-degree heart block.   The coronary arteriography was done to rule out significant coronary  disease.  Note should be made that the patient's urinary drug screen was  positive for cocaine and marijuana on admission   Cine catheterization done with 6-French catheters from right femoral  artery at the end of the case, we had good hemostasis with AngioSeal.   Left main coronary was normal.   Left anterior descending artery had a smooth 40% tubular lesion  proximally. Mid and distal LAD were normal.  First diagonal branch was  normal.   Circumflex coronary was nondominant and normal.   Right coronary artery was dominant and normal.   RAO VENTRICULOGRAPHY:  RAO Ventriculography was normal.  EF was 60%.  There was no gradient across the aortic valve and no MR.  Aortic  pressure is 120/73, LV pressure is 120/40.   IMPRESSION:  The patient's LAD lesion is smooth and non-flow limiting.  He may get into future trouble if he continues to do a cocaine in  regards to the risk of spasm. He will be discharged later today.  He has  not had any significant heart block since admission.  He will be  discharged on aspirin and Zocor.   Beta blockers will not be used due to his history of cocaine use.   The patient tolerated the procedure well.      Noralyn Pick. Eden Emms, MD, [facility]  Electronically Signed     PCN/MEDQ  D:  05/19/2007  T:  05/19/2007  Job:  [id number]

## 2011-03-06 NOTE — Assessment & Plan Note (Signed)
[facility]                            CARDIOLOGY OFFICE NOTE   NAME:[name], [name]                        MRN:          [id number]  DATE:06/16/2007                            DOB:          05/08/1956    [name] is seen today in followup.  He was admitted to the hospital  recently with chest pain.  He had second-degree heart block.  Heart cath  showed a 40% LAD lesion.  His transient AV block was thought due to  drugs.  His drug screen was positive for cocaine and Cannabis.   I had a frank discussion with Dimarco while he was in the hospital and  explained to him that he is at risk for myocardial infarction with  cocaine use, although his 40% lesion is not critical, induced spasm on  this can cause a heart attack.  He understands this.  He has not had  chest pain since leaving the hospital.  His heart block was transient.  He structurally has a normal heart by echo and by heart cath, and we did  not think he needed further workup for this.  He has not had any  significant palpitations, syncope, or chest pain since hospital  discharge.  He tells me he has not used any drugs.   REVIEW OF SYSTEMS:  Remarkable for continued cervical and back problems.  He apparently has had an MRI last June and needs to have this resolved  prior to going back to work full time.   I told him from a cardiac perspective, he could go back to work.  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  Apparently, the  neurosurgeon would not see him since he was a [facility] patient.   His current medications include an aspirin a day and simvastatin 40 a  day.   PHYSICAL EXAMINATION:  His exam is remarkable for a healthy-appearing,  fairly young black male in no distress.  His blood pressure is 140/85, pulse 76 and regular.  Weight is 187.  Respiratory rate is 12.  He is afebrile.  HEENT:  Normal.  NECK:  Carotids are normal without bruit.  There is no  lymphadenopathy.  There is no thyromegaly.  No JVP elevation.  LUNGS:  Clear with good diaphragmatic motion.  No wheezing.  CARDIAC:  There is an S1 and S2 with normal heart sounds.  PMI is  normal.  ABDOMEN:  Benign.  There is no tenderness.  Bowel sounds positive.  No  AAA.  No bruit.  No hepatosplenomegaly or hepatojugular reflux.  Femorals are +4 bilaterally without bruits.  PT's are +3.  Cath site is  well healed without evidence of AV fistula or pseudoaneurysm.  NEURO:  Nonfocal.  There is no muscular weakness.  SKIN:  Warm and dry.   Baseline EKG today is totally normal.  P-R interval is 180, QRS duration  is 86 ms, and Q-T interval is 364.   IMPRESSION:  1. Previous chest pain in the setting of drug use.  Catheterization      with a 40%  left anterior descending artery lesion, not having      recurrent symptoms off drugs.  Follow-up Myoview in a year.  2. Risk factor modification:  Continue with baby aspirin a day and      simvastatin 40 a day.  Samples of Crestor were given since he is a      [facility] patient.  I wanted to give him some medication.  He      will have a follow-up lipid and liver profile in six months.  3. Transient heart block in the hospital.  No evidence of significant      electrical problems.  Baseline EKG normal without P-R prolongation      or bundle branch blocks.  No indication for pacemaker.  He will      call us if he has any lightheadedness or presyncope.  4. Drug use, currently abstinent:  Patient will let us know if he has      any further problems with this.  He does not apparently need      inpatient therapy for this.  He will follow up with [facility].  5. Cervical neck problems:  This seems to be a significant problem for      [name].  I do not have the results of his MRI.  Hopefully he can      get $6 together to follow up with [facility], and they can re-      refer him back to neurosurgery.  In regards to his work, I told him       he could go back to work on September 2nd, but I think there will      be continued issues in regards to his cervical spine problems.   Overall, I think [name] is doing better, so long as he does not do  drugs and takes his aspirin and cholesterol medicine.  He should do  fine.  I will see him back in a year for a stress Myoview.     Noralyn Pick. Eden Emms, MD, [facility]  Electronically Signed    PCN/MedQ  DD: 06/16/2007  DT: 06/16/2007  Job #: [id number]

## 2011-03-06 NOTE — Assessment & Plan Note (Signed)
[facility]                            CARDIOLOGY OFFICE NOTE   NAME:MINORMadex, Seals                        MRN:          [id number]  DATE:10/07/2008                            DOB:          03/11/1956    Russell Carter returns today for followup.  Unfortunately, he states he is not  doing drugs anymore.  He has had previous atypical chest pain with  normal heart cath in the setting of drug abuse.  He has been  noncompliant with his meds.  He says he cannot afford them.  He is,  however, on generic amlodipine and simvastatin.  He uses the [facility] on  [address].  I told him we would call these in and these are generic  and they should not cost more than 4 dollars.  His blood pressure  needless to say is a little high in the office today.   He does continue to get atypical chest pain that tend to occur when he  is using a grinder underneath the car.  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   There is no indication for further testing.  He is currently not taking  any medicine on a regular basis.  I explained to him the importance of  taking aspirin as well as his cholesterol and blood pressure medicine.  He is allergic to [facility].   PHYSICAL EXAMINATION:  GENERAL:  A healthy-appearing black male in no  distress.  VITAL SIGNS:  Weight is 197, blood pressure is 150/88, pulse 86 and  regular, respiratory rate 14, and afebrile.  HEENT:  Unremarkable.  NECK:  Carotids are normal without bruit.  No lymphadenopathy,  thyromegaly, or JVP elevation.  LUNGS:  Clear with good diaphragmatic motion.  No wheezing.  CARDIAC:  S1 and S2 with a soft systolic murmur.  PMI normal.  ABDOMEN:  Benign.  Bowel sounds positive.  No AAA, no tenderness, no  bruit, no hepatosplenomegaly or hepatojugular reflux.  EXTREMITIES:  Distal pulses are intact.  No edema.  NEUROLOGIC:  Nonfocal.  SKIN:  Warm and dry.  MUSCULOSKELETAL:  No muscular  weakness.   IMPRESSION:  1. Recurrent atypical chest pain.  Cath on May 19, 2007, in the      setting of cocaine use with only a 40% tubular lesion in the left      anterior descending.  Pain sounds more musculoskeletal.  Continue      to take an aspirin a day.  2. Hypercholesterolemia:  Continue simvastatin.  The patient needs to      be compliant with this.  Lipid and liver profile in 6 months.  3. Hypertension:  To try to get him to take his amlodipine 10 mg a      day, low-sodium diet.  4. Previous drug use:  The patient denies active use of cocaine at      this time.   I will see him back in 6 months.     Russell Pick. Eden Emms, MD, [facility]  Electronically Signed    PCN/MedQ  DD: 10/07/2008  DT:  10/08/2008  Job #: [id number]

## 2011-03-06 NOTE — Discharge Summary (Signed)
NAMEIBN, STIEF               ACCOUNT NO.:  [id number]   MEDICAL RECORD NO.:  [id number]          PATIENT TYPE:  INP   LOCATION:  2908                         FACILITY:  [facility]   PHYSICIAN:  Noralyn Pick. Eden Emms, MD, FACCDATE OF BIRTH:  [birth year]   DATE OF ADMISSION:  05/17/2007  DATE OF DISCHARGE:  05/19/2007                               DISCHARGE SUMMARY   DISCHARGE DIAGNOSIS:  Chest pain.   SECONDARY DIAGNOSES:  1. Second-degree type 1 AV block.  2. Reported history of gout.  3. Questionable history of diabetes.  4. Status post inguinal hernia repair.  5. Polysubstance abuse including marijuana and cocaine.   ALLERGIES:  SHRIMP.   PROCEDURES:  Left heart cardiac catheterization.   HISTORY OF PRESENT ILLNESS:  A 55 year old African-American male with no  prior history of CAD.  He reports that he was told he might have  diabetes although this was never confirmed by a doctor.  He was in his  usual state of health until May 17, 2007 when he presented to the [facility] ED with complaints of left wrist pain which he attributes to gout.  While in the ED, he had an episode of chest discomfort and was noted on  telemetry to exhibit second-degree type 1 AV block.  He was subsequently  seen by cardiology and admitted for further evaluation.   HOSPITAL COURSE:  Patient ruled out for MI.  Urine drug screen was  positive for marijuana and cocaine.  He continued to complain of chest  discomfort throughout the weekend despite normal ECG and cardiac  markers.  He underwent left heart cardiac catheterization on July 28 and  this showed nonobstructive coronary artery disease with a 40% stenosis  in the proximal LAD.  EF was normal at 60%.  We have strongly  recommended all discontinuation of cocaine and marijuana.  We have  initiated low-dose aspirin and statin therapy, and have recommended the  patient follow up with [facility] for primary care.  He is being  discharged home today in  satisfactory condition.   DISCHARGE LABS:  Hemoglobin 13.9, hematocrit 40.8, WBC 5.8, platelets  238, MCV 90.8.  Sodium 137, potassium 4.4, chloride 107, CO2 23, BUN 23,  creatinine 1.31, glucose 156.  INR 1.1, PTT 36.  Total bilirubin 0.4,  alkaline phosphatase 37, AST 19, ALT 18, lipase 21, albumin 3.4.  CK 78,  MB 1.1, troponin I 0.01.  Calcium 9.5, magnesium 1.8.  D-dimer less than  0.22.  BNP less than 30.  TSH 1.547.  Free T4 0.99.  Hemoglobin A1c 5.8.  Urine drug screen positive for cocaine and THC.   DISPOSITION:  Patient is being discharged home today in good condition.   FOLLOWUP PLANS AND APPOINTMENTS:  We have arranged for followup  appointment with Dr. Eden Emms August 25 at 10:45 a.m.  We otherwise  recommended the patient follow up with [facility] and patient has been  seen by case management who will assist in this process.   DISCHARGE MEDICATIONS:  1. Aspirin 81 mg q.d.  2. Simvastatin 40 mg q.h.s.  3. Nitroglycerin 0.4 mg  sublingual p.r.n. chest pain.   OUTSTANDING LABS/STUDIES:  None.   Duration of discharge encounter 40 minutes including physician time.      Nicolasa Ducking, ANP      Noralyn Pick. Eden Emms, MD, [facility]  Electronically Signed    CB/MEDQ  D:  05/19/2007  T:  05/19/2007  Job:  [id number]   cc:   Dala Dock

## 2011-03-06 NOTE — Discharge Summary (Signed)
NAME:  Russell Carter, Russell Carter               ACCOUNT NO.:  [id number]   MEDICAL RECORD NO.:  [id number]          PATIENT TYPE:  OBV   LOCATION:  3715                         FACILITY:  [facility]   PHYSICIAN:  Russell Pick. Eden Emms, MD, FACCDATE OF BIRTH:  1955/12/22   DATE OF ADMISSION:  10/28/2007  DATE OF DISCHARGE:  10/29/2007                               DISCHARGE SUMMARY   PROCEDURES:  Gated exercise treadmill Myoview.   PRIMARY FINAL DISCHARGE DIAGNOSIS:  Chest pain, cardiac enzymes negative  for MI and Myoview negative for ischemia.   SECONDARY DIAGNOSES:  1. Hyperlipidemia with a total cholesterol of 264, triglycerides 120,      HDL 52, LDL 188.  2. History of transient second-degree heart block during previous      admission.  3. Status post cardiac catheterization in July 2008 showing      nonobstructive coronary artery disease with a 40% LAD, medical      therapy recommended.  4. Remote history of cocaine abuse.  5. Gout.  6. Hyperglycemia.  7. 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  8. Status post inguinal hernia repair and left clavicle surgery.  9. Allergy or intolerance to shrimp.  10.Family history of coronary artery disease in both parents and one      brother.   TIME AT DISCHARGE:  41 minutes.   HOSPITAL COURSE:  Russell Carter is a 55 year old male with a history of  nonobstructive coronary artery disease.  While at work, he felt some  chest discomfort described as tightness and had shortness of breath.  He  came to the emergency room and was admitted for further evaluation.   His initial CK was slightly elevated at 330 but the MB and all troponins  and subsequent CK MBs were negative.  A urine drug screen was negative  and his D-dimer was within normal limits at 0.41.  He  complained of  significant problems sleeping because of pain in his shoulders, but his  chest pain had resolved.   An exercise  treadmill stress test was performed showing no scar or  ischemia and an EF of 58%.  Dr. Eden Carter evaluated the films and felt no  further inpatient evaluation was indicated.  Of note, Russell Carter's blood  sugars were elevated at times and one of the fasting once was 145.  However, repeat was 94.  He was given information on a diabetic diet and  encouraged to follow up with his primary care physician.  Russell Carter was  considered stable for discharge on October 29, 2007 with outpatient  follow-up arranged.   DISCHARGE INSTRUCTIONS:  1. His activity level is to be increased gradually.  2. He is to stick to a low-sodium diabetic diet.  3. He is to follow up with Dr. Eden Carter on January 27, at 3:30 and      follow up with Dr. Emeline Darling by calling for an appointment.   DISCHARGE MEDICATIONS:  1. Zocor 40 mg daily.  2. Aspirin 81 mg a day.  3. Norvasc 5  mg a day.  4. Naprosyn 250 mg t.i.d.  5. Vicodin 5/500 one or two tablets t.i.d. p.r.n.Russell Demark, PA-C      Russell Pick. Eden Emms, MD, [facility]  Electronically Signed    RB/MEDQ  D:  10/29/2007  T:  10/30/2007  Job:  [id number]   cc:   Emeline Darling, M.D.

## 2011-03-06 NOTE — Assessment & Plan Note (Signed)
[facility]                            CARDIOLOGY OFFICE NOTE   NAME:Russell Carter, Rigor                        MRN:          [id number]  DATE:11/18/2007                            DOB:          1955-11-28    HISTORY:  Russell Carter returns today for followup.  He had been having  substernal chest pain.  We did a heart cath on him May 19, 2007 and it  was normal.  He does have a history of cocaine use.  The patient currently is abstaining from drugs.  He has hypertension,  hypercholesterolemia.   MEDICATIONS:  1. He has been on Norvasc 5 a day.  2. Simvastatin 40 a day.  3. Aspirin.   REVIEW OF SYSTEMS:  Remarkable for a recent fall on some ice with  bruising of his left arm.  He has it in an immobilizer.  He did get x-  rays of it and nothing was broken   PHYSICAL EXAMINATION:  VITAL SIGNS:  Remarkable for a blood pressure of  138/78, pulse is 75 and regular, afebrile, respiratory 14.  HEENT:  Unremarkable.  NECK:  No carotid bruits, lymphadenopathy, thyromegaly, JVP elevation.  LUNGS:  Clear diaphragmatic motion.  No wheezing.  HEART:  S1-S2, normal heart sounds.  PMI normal.  ABDOMEN:  Benign.  Bowel sounds are positive. No bruit.  No  hepatosplenomegaly.  No hepatojugular reflux.  EXTREMITIES:  Distal pulses are intact, no edema.  NEURO:  Nonfocal.   DIAGNOSTICS:  EKG normal.   IMPRESSION:  1. Chest pain, previously normal cath, resolved.  No further workup.  2. Hypertension, low-salt diet.  Continue Norvasc.  May need to      increased to 10 mg a day.  He will get his blood pressure checked      at [facility] twice a week and call us. 3.  Hypercholesterolemia.      Check liver and lipid profile in July.  Continue simvastatin 40 a      day.  3. Previous drug use, continue to abstain.  Consider urine drug test      in July when I see him back.  4. Recent fall with a bruise to the left arm.  Follow up with      orthopedics as needed.  Okay to  take Tylenol or aspirin as      analgesics.     Noralyn Pick. Eden Emms, MD, [facility]  Electronically Signed   PCN/MedQ  DD: 11/18/2007  DT: 11/19/2007  Job #: [id number]

## 2011-03-06 NOTE — H&P (Signed)
Russell Carter, Russell Carter               ACCOUNT NO.:  [id number]   MEDICAL RECORD NO.:  [id number]          Carter TYPE:  INP   LOCATION:  1823                         FACILITY:  [facility]   PHYSICIAN:  Rod Holler, MD     DATE OF BIRTH:  12/08/1955   DATE OF ADMISSION:  05/17/2007  DATE OF DISCHARGE:                              HISTORY & PHYSICAL   CHIEF COMPLAINT:  Chest pain.   HISTORY OF PRESENT ILLNESS:  Russell Carter is a 55 year old male with a  history of diabetes mellitus, on no medicines currently, who presented  to [facility] emergency department initially with complaints of left wrist  pain.  While in [facility] emergency department, Russell Carter had an episode of  chest discomfort, and was noted on telemetry to have a second-degree  type I AV block.  At current, Russell Carter complains of left-sided sharp  chest discomfort.  There are intermittent periods of worsening  discomfort that he rates as a 10/10 that lasts for about 5 seconds and  then subsides.  With these episodes, Russell Carter complains of feeling  like he is going to pass out, having a hot feeling all over and  shortness of breath.  He has no complaints of nausea.  He has had no  syncope.  He has had no recent PND or orthopnea, no recent lower  extremity edema.  Russell Carter states that he has never had this type of  chest discomfort before.   PAST MEDICAL HISTORY:  1. Status post inguinal hernia repair.  2. Diabetes mellitus.   MEDICINES:  None.   ALLERGIES:  No known drug allergies.   SOCIAL HISTORY:  Russell Carter is a nonsmoker, works as a Horticulturist, commercial.   FAMILY HISTORY:  Mother and father both with history of coronary artery  disease.   REVIEW OF SYSTEMS:  All systems are reviewed in detail and are negative,  except as noted in Russell history of present illness.   PHYSICAL EXAMINATION:  VITAL SIGNS:  Temperature 97.3, blood pressure  137/91, heart rate 73, respiratory rate 20 and oxygen saturation 99%.  GENERAL:  A well-developed, well-nourished male, alert and oriented x3,  in no apparent distress.  HEENT:  Atraumatic, normocephalic.  Pupils are equal, round and reactive  to light.  Extraocular movements intact.  Oropharynx clear.  NECK:  Supple.  No adenopathy, no JVD, no carotid bruits.  CHEST:  Lungs clear to auscultation bilaterally with equal bilateral  breath sounds.  CORONARY:  Regular rhythm, bradycardic, no murmurs, rubs, or gallops, 2+  peripheral pulses.  ABDOMEN:  Soft, nontender and non-distended.  Active bowel sounds.  No  hepatosplenomegaly.  EXTREMITIES:  No clubbing, cyanosis, or edema.  NEUROLOGIC:  No focal deficits.   LABORATORY AND ACCESSORY CLINICAL DATA:  EKG shows a sinus bradycardia,  second-degree type I AV block, nonspecific ST changes.   Sodium 138, potassium 4, chloride 106, bicarb 24, BUN 22, creatinine  1.4, glucose 113.  Troponin less than 0.05, myoglobin 63, CK-MB of 1.3.  White blood cell count 6.6, hematocrit 45.8, platelet count 255,000.  IMPRESSION:  Russell Carter is a 55 year old male who initially presented  with wrist pain, was noted on telemetry to have a second-degree type I  atrioventricular block, also with an episode of chest discomfort in [facility]  emergency department.   PLAN:  1. Admit Russell Carter to a telemetry bed, unless Russell Carter is not      chest-pain-free, at which time he will need to be admitted to a      stepdown unit.  We will continue Russell nitroglycerin that was started      in [facility] emergency department, aspirin daily, no beta blocker, given      Russell bradycardia, check a lipid panel, serial cardiac enzymes, daily      EKG, and we will continue nitroglycerin drip.  We will check a D-      dimer.  2. Pulmonary:  Get a chest x-ray.  3. Endocrine:  Thyroid function tests, hemoglobin A1c, sliding-scale      insulin.  4. Fluids, electrolytes and nutrition:  Diabetic diet, CMP with next      lab draw, magnesium level with next lab  draw.      Rod Holler, MD  Electronically Signed     TRK/MEDQ  D:  05/17/2007  T:  05/17/2007  Job:  [id number]

## 2011-03-06 NOTE — H&P (Signed)
NAMETYWON, NIDAY               ACCOUNT NO.:  [id number]   MEDICAL RECORD NO.:  [id number]          PATIENT TYPE:  EMS   LOCATION:  MAJO                         FACILITY:  [facility]   PHYSICIAN:  Gerrit Friends. Dietrich Pates, MD, FACCDATE OF BIRTH:  11-Sep-1956   DATE OF ADMISSION:  10/28/2007  DATE OF DISCHARGE:                              HISTORY & PHYSICAL   PRIMARY CARDIOLOGIST:  [name] C. Eden Emms, MD, [facility]   PRIMARY CARE PHYSICIAN:  [facility].   HISTORY OF PRESENT ILLNESS:  This is a 55 year old African-American male  with known history of nonobstructive coronary artery disease per cardiac  catheterization in July of 2008 with a 25% LAD lesion with other  arteries normal.  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  Could not get deep breaths in and  had some pain radiating down to his left arm.  The patient stopped what  he was doing, took one sublingual Nitroglycerin and rested and the pain  eased a little after about 30 minutes and his breathing improved.  The  patient got up and began walking around and the pain recurred  approximately 15 minutes later with again radiation to the left arm.  He  felt his heart racing and felt chest tightness.  He got in his car and  drove to the emergency room and his breathing got worse.  On arrival he  was given baby aspirin and oxygen and pain has been almost completely  relieved and his breathing is better.  The pain he states is similar to  the pain he had when he was admitted in July of 2008.   At that admission the patient was found to be positive for cocaine and  marijuana and felt this was related to coronary artery spasm.  He did  undergo cardiac catheterization revealing an LAD 40% lesion which was  treated medically and asked to stop using cocaine and marijuana.  The  patient has stopped cocaine, but admits to marijuana use over Christmas.  The patient also states he had a recent  chest cold over Christmas with  frequent coughing and congestion which he has completely recovered from  as far as he knows.   REVIEW OF SYSTEMS:  Positive for chest pain, shortness of breath,  wheezing, palpitations and diaphoresis.  Otherwise negative.   PAST MEDICAL HISTORY:  1. Transient second degree A-V block during last admission now      completely resolved.  2. Known CAD status post nonobstructive coronary artery disease with a      40% lesion in the LAD.  3. Prior history of cocaine abuse.  4. Gout.  5. 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   PAST SURGICAL HISTORY:  1. Inguinal hernia repair.  2. Left clavicle removed and replaced secondary to a crushing injury.   SOCIAL HISTORY:  He lives in [place] with his wife and son.  He is a  Environmental manager.  Does not smoke. Occasional beer.  Marijuana  on occasion.  The patient admits to last use during Christmas.   FAMILY HISTORY:  Mother had MI's x7 and is still alive.  Father died of  an MI.  Brother died of an MI who was older than he is.   CURRENT MEDICATIONS:  1. Aspirin 81 mg daily.  2. Simvastatin 40 mg q.h.s.  3. Nitroglycerin 0.4 mg p.r.n. pain.   ALLERGIES:  SHRIMP.   LABORATORY DATA:  Sodium 137, potassium 5.2, chloride 110, CO2 38, BUN  19, creatinine 1.4.  Hemoglobin 15.4, hematocrit 45.0, white blood cell  count 4.12, platelets 292.  CK 93.2, MB 1.4 with troponin of less than  0.05.   PHYSICAL EXAMINATION:  Blood pressure 148/98, pulse 74, respirations 19,  temperature 98.4 with an O2 saturation of 98% on room air.  HEENT:  Head is normocephalic and atraumatic.  PERRLA.  He wears  glasses.  Mucous membranes in the mouth are pink and moist.  He has poor  dentition.  NECK:  Supple without JVD or carotid bruits.  CARDIOVASCULAR:  Regular rate and rhythm without murmur,  rub or gallop.  Pulses are 1+ bilaterally radial and posterior tibial.  LUNGS:  Essentially clear to auscultation with crackles on the left.  ABDOMEN:  Soft, nontender with 2+ bowel sounds.  EXTREMITIES:  Without clubbing, cyanosis or edema.  NEUROLOGIC:  Cranial nerves 2-12 are grossly intact with the exception  of left arm paresthesia and pain.   EKG revealing normal sinus rhythm with a ventricular rate of 66 beats  per minute.  Chest x-ray is pending.   IMPRESSION:  1. Chest pain in a patient with known nonobstructive CAD, typical      features for angina.  2. Hypercholesterolemia.  3. Chronic right shoulder pain with known cord compression per MRI in      June of 2008 with a C3-C4, C4-C5 and C6-C7 compression with      continued right upper extremity pain and paresthesia.   PLAN:  The patient will be admitted to rule out MI.  Cycle cardiac  enzymes.  We will do a urine drug screen and d-dimer.  Consider cath  versus stress Myoview.  If positive cardiac enzymes will plan for  catheterization.  The patient also has complaints of chronic right  shoulder pain.  We will consult neurosurgeon for follow up and  evaluation during hospitalization.      Bettey Mare. Lyman Bishop, NP      Gerrit Friends. Dietrich Pates, MD, [facility]  Electronically Signed    KML/MEDQ  D:  10/28/2007  T:  10/28/2007  Job:  [id number]

## 2011-03-09 NOTE — Op Note (Signed)
NAME:  Shrewsberry, Cabe                           ACCOUNT NO.:  [id number]   MEDICAL RECORD NO.:  [id number]                   PATIENT TYPE:  AMB   LOCATION:  DSC                                  FACILITY:  [facility]   PHYSICIAN:  Gabrielle Dare. Janee Morn, M.D.             DATE OF BIRTH:  Dec 15, 1955   DATE OF PROCEDURE:  05/22/2004  DATE OF DISCHARGE:                                 OPERATIVE REPORT   PREOPERATIVE DIAGNOSIS:  Recurrent left inguinal hernia.   POSTOPERATIVE DIAGNOSIS:  Recurrent left inguinal hernia.   PROCEDURE:  Repair of recurrent left inguinal hernia with mesh.   SURGEON:  Gabrielle Dare. Janee Morn, M.D.   ASSISTANT:  Abigail Miyamoto, M.D.   ANESTHESIA:  General.   INDICATIONS FOR PROCEDURE:  The patient is a 55 year old African-American  male who is status post left inguinal hernia repair in the past.  He had a  reinjury to the area while at work lifting some heavy boxes after his  previous repair.  He presents for a repair of the recurrent left inguinal  hernia with mesh.   DESCRIPTION OF PROCEDURE:  An informed consent was obtained.  The patient  received intravenous antibiotics.  He was brought to the operating room and  general anesthesia was administered.  His left lower abdomen and groin were  prepped and draped in a sterile fashion.  An incision was made along his  previous groin incision.  The subcutaneous tissues were dissected down.  The  external oblique fascia was divided.  Further dissection revealed the hernia  sac. This was dissected inferiorly off of the shelving edge, and inspection  revealed that this was consistent with a direct hernia.  He had minimal  tissues of any strength in his inguinal floor, and the large hernia  protruded through.  Further dissection which was quite difficult, freed it  up off the superior aspect of the aponeurosis where it was stuck, and back  towards the internal ring.  The cord structures were encircled, and the  hernia sac was  dissected away carefully from the cord structures, all the  way down and freeing it up from the internal ring area, where it was quite  adhesed.  The vas deferens was protected and we kept a Penrose drain  encircling the cord structures.  At this time the hernia sac was left intact  and reduced back into the abdomen.  The preperitoneal space was freed up and  then we completed the repair using the Ethicon hernia system, which is poly-  propylene.  The hernia system was inserted into the abdomen with the leaflet  unfolded in the preperitoneal space nicely, keeping the hernia reduced.  The  external portion of the mesh after the plug part, was then tacked medially  to the tissues over the pubic tubercle and was sewn in an interrupted  fashion down along the shelving edge of the inguinal ligament with #  0  Prolene sutures.  A slit was cut for the cord structures, and they were  encircled, reconstructing the ring there.  The mesh was further tacked again  medially to the tissues over the pubic tubercle and then up in an  interrupted fashion with #0 Prolene along the aponeurosis, completing the  repair.  The area was copiously irrigated and excellent hemostasis was  ensured.  The mesh remained intact, with an excellent repair of the hernia  defect.  Once this was accomplished, 0.25% Marcaine with epinephrine was  injected in the musculature and in the skin and soft tissues for  postoperative pain relief.  The area was again copiously irrigated.  The  external oblique was reapproximated with running #3-0 Vicryl sutures.  The  subcutaneous tissues were reapproximated with interrupted #3-0 Vicryl  sutures, and the skin was again irrigated and then closed with running #4-0  Monocryl subcuticular stitch.  The sponge, needle and instrument counts were  correct.  Benzoin and Steri-Strips and sterile dressings were applied.  Subsequently the patient's left testicle was returned back to its normal   position in his scrotum.  He was taken to the recovery room after tolerating the procedure without  apparent complications.                                               Gabrielle Dare Janee Morn, M.D.    BET/MEDQ  D:  05/22/2004  T:  05/22/2004  Job:  [id number]

## 2011-03-09 NOTE — Op Note (Signed)
NAME:  Russell Carter, Russell Carter                           ACCOUNT NO.:  [id number]   MEDICAL RECORD NO.:  [id number]                   PATIENT TYPE:  AMB   LOCATION:  DSC                                  FACILITY:  [facility]   PHYSICIAN:  Gabrielle Dare. Janee Morn, M.D.             DATE OF BIRTH:  [birth year]   DATE OF PROCEDURE:  02/02/2003  DATE OF DISCHARGE:                                 OPERATIVE REPORT   PREOPERATIVE DIAGNOSIS:  Left inguinal hernia.   POSTOPERATIVE DIAGNOSIS:  Direct left inguinal hernia.   PROCEDURE:  Repair of left inguinal hernia with mesh.   SURGEON:  Violeta Gelinas, M.D.   ANESTHESIA:  General with LMA.   HISTORY OF PRESENT ILLNESS:  The patient is a 55 year old male who has had a  long history of a very small left inguinal hernia, which recently worsened  during work.  He moves a lot of heavy items with his job.  He was evaluated  in the Emergency Department, diagnosed with a left inguinal hernia, and  referred to my office for evaluation.  At that time, I diagnosed him with  left inguinal hernia and we planned elective repair with mesh.   DESCRIPTION OF PROCEDURE:  The patient was brought to the operating room.  He had received intravenous antibiotics.  General anesthesia was  administered and his left lower abdomen and groin were prepped and draped in  sterile fashion.  A left inguinal incision was made in the standard  location.  Subcutaneous tissues were dissected down revealing the external  oblique fascia, which was sharply divided.  This division was continued down  through the external ring with the Metzenbaum scissors.  The lower leaflet  of the external oblique was dissected bluntly free from the cord structures  revealing the shelving edge of the inguinal ligament.  The superior leaflet  was dissected free from the underlying structures revealing aponeurosis.  The cord structures were then encircled.  At this time, it was noted that  this was a large direct  inguinal hernia that was quite stuck to the cord  structures.  This was dissected free protecting his ilioinguinal nerve.  The  sac was freed from the cord structures and easily reduced back into the  abdomen.  Further dissection of the cord revealed the vessels and the vas  deferens and a small cord lipoma, but there was no indirect hernia sac.  Once I was satisfied with this dissection, the direct hernia sac was held  reduced down back into the abdomen and the hernia repair was accomplished  with a polypropylene mesh and the keyhole formed.  It was secured to the  area of the pubic tubercle with 0 Prolene and the mesh was sewn to the  shelving edge of the inguinal ligament in a running fashion inferiorly.  After that was completed, the mesh was secured superiorly initially along  the pubic tubercle and  then along the aponeurosis with interrupted 0 Prolene  sutures.  After this was accomplished, the internal ring was reconstructed  by joining the two leaflets of the keyhole mesh with a 0 Prolene suture and  this was tacked down to the musculature in a secure fashion.  The direct  hernia sac remained well-reduced as the mesh was placed and the area was  copiously irrigated and mesh was noted to be in good position.  At this  time, meticulous hemostasis was insured.  0.25% Marcaine was used for local  anesthetic in the skin, subcutaneous tissues and around the fascia for  postoperative pain relief.  The external oblique was closed with a running 3-  0 Vicryl suture.  Scarpa's fascia was then closed with interrupted 3-0  Vicryl sutures.  The area was then  irrigated again and the skin was closed with a running 4-0 Monocryl  subcuticular stitch.  Benzoin, Steri-Strips, and sterile dressing were  applied.  Sponge, needle and instrument counts were all correct.  The  patient tolerated the procedure without apparent complication and was taken  to the recovery room in stable condition.                                                Gabrielle Dare Janee Morn, M.D.    BET/MEDQ  D:  02/02/2003  T:  02/02/2003  Job:  [id number]

## 2011-03-16 IMAGING — MR MR HEAD WO/W CM
11 of 15 series · 26 of 48 positions shown · IV contrast (multihance)
Comparison: CT 03/24/2007

CLINICAL DATA: Dizziness and vertigo.  Diabetes.  Rule out acoustic
neuroma.

MRI HEAD WITHOUT AND WITH CONTRAST
TECHNIQUE: Multiplanar, multiecho pulse sequences of the brain and
surrounding structures were obtained according to standard protocol
without and with intravenous contrast
Contrast: 17 ml Multihance IV

[Series 3: T1 · sagittal · 5.0mm · 0.47mm/px · 2 of 12 slices shown (1 of 6)]
[im 1/12]
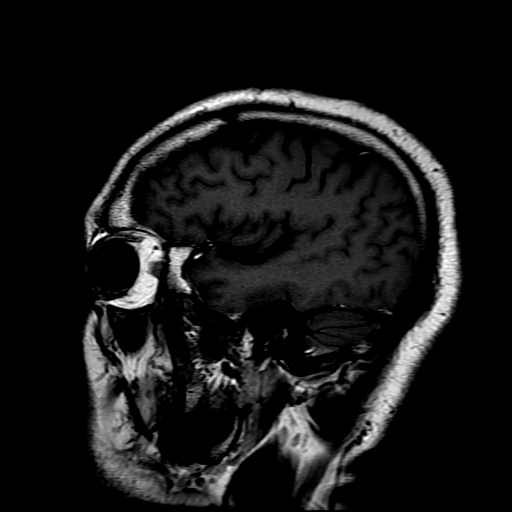
[im 12/12]
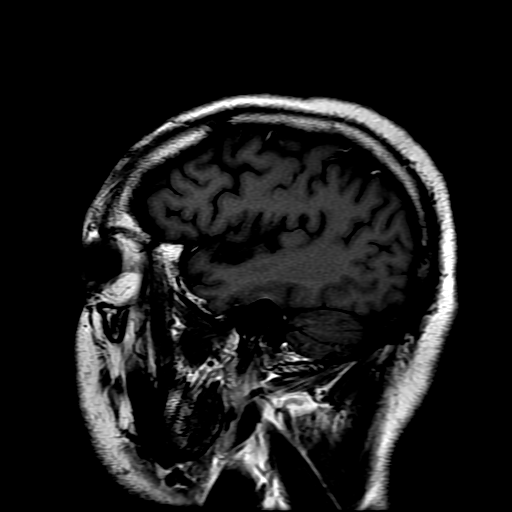

[Series 4: DWI · axial · 5.0mm · 1.09mm/px · z∈[-66,+87]mm · 7 of 58 slices shown (1 of 2)]
[im 1/58]
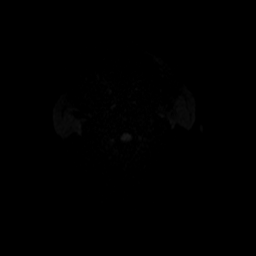
[im 10/58]
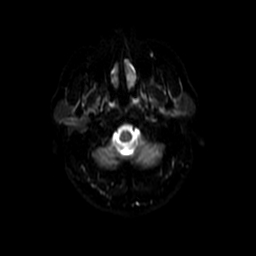
[im 20/58]
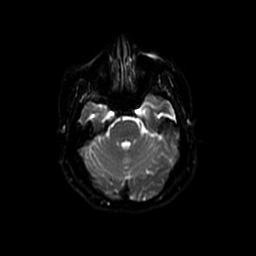
[im 29/58]
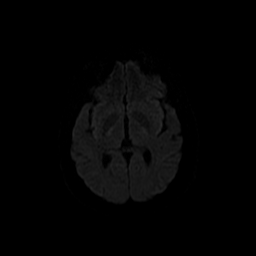
[im 39/58]
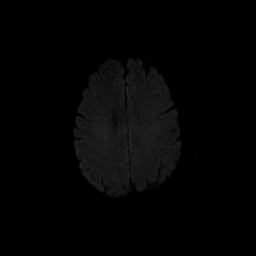
[im 48/58]
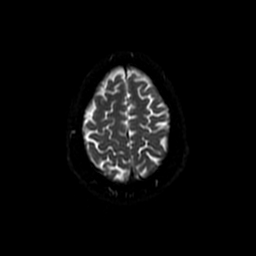
[im 58/58]
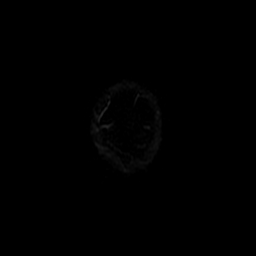

[Series 5: T2 · axial · 5.0mm · 0.43mm/px · z∈[-46,+100]mm · 2 of 22 slices shown]
[im 1/22]
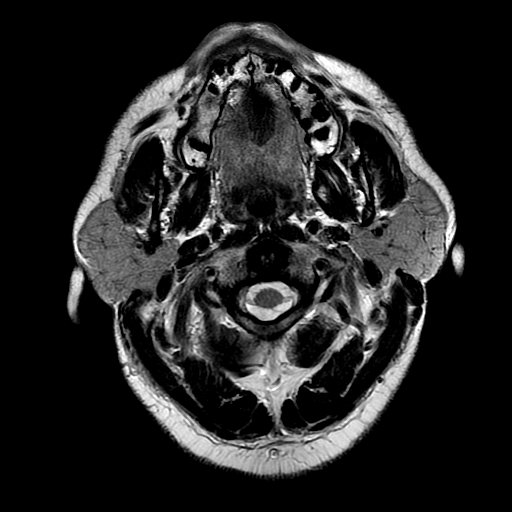
[im 22/22]
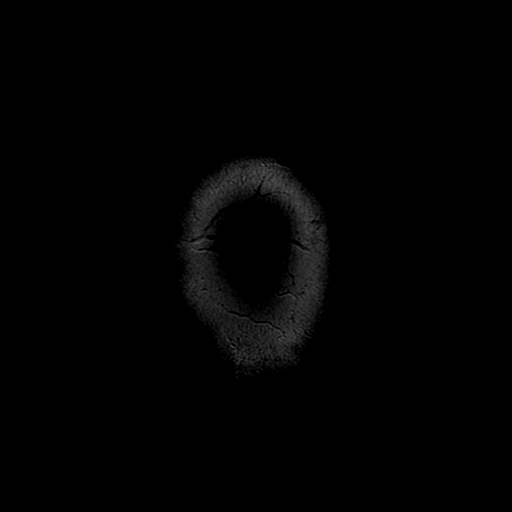

[Series 6: FLAIR · axial · 5.0mm · 0.43mm/px · z∈[-46,+100]mm · 2 of 22 slices shown]
[im 1/22]
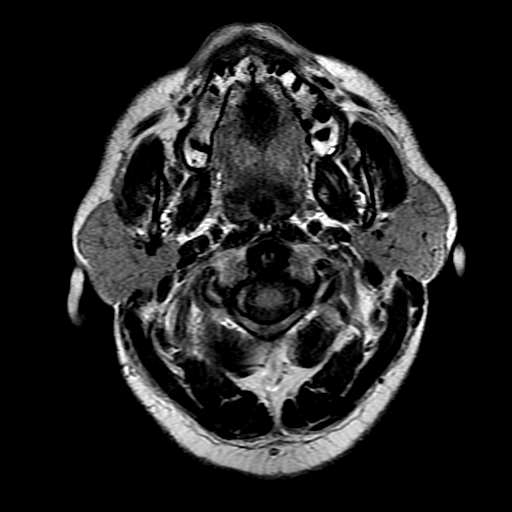
[im 22/22]
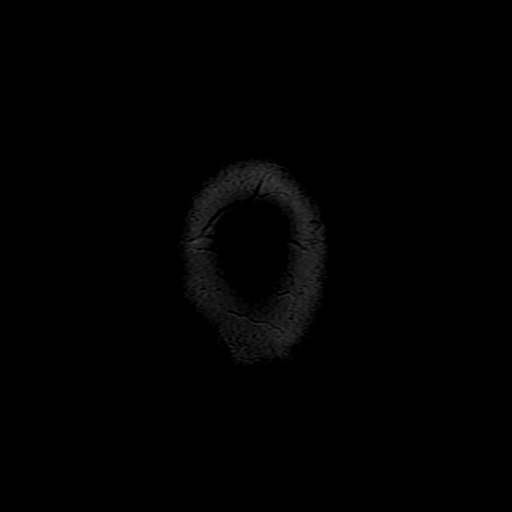

[Series 9: T1 · axial · 3.0mm · 0.31mm/px · 1 of 13 slices shown (2 of 6)]
[im 1/13]
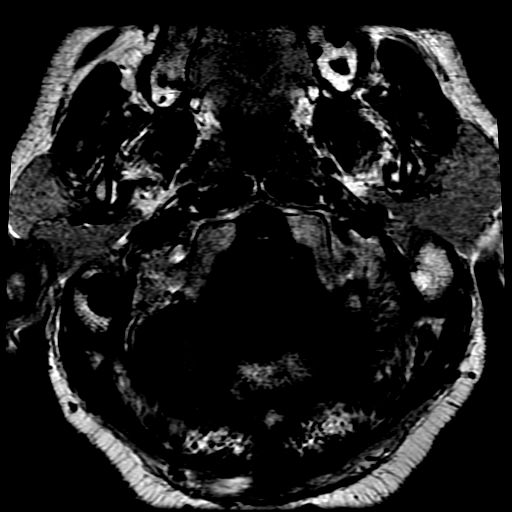

[Series 11: T1 · coronal · 3.0mm · 0.33mm/px · 1 of 10 slices shown (3 of 6)]
[im 1/10]
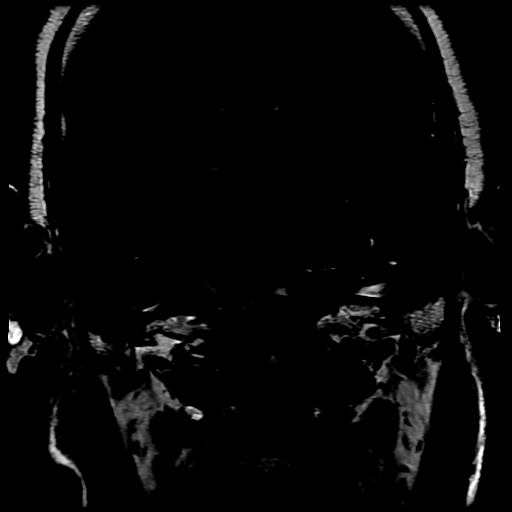

[Series 13: T2 post-contrast · coronal · 5.0mm · 0.39mm/px · 3 of 24 slices shown]
[im 1/24]
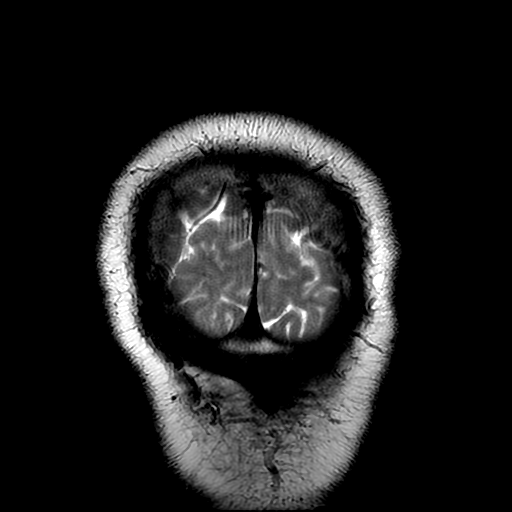
[im 12/24]
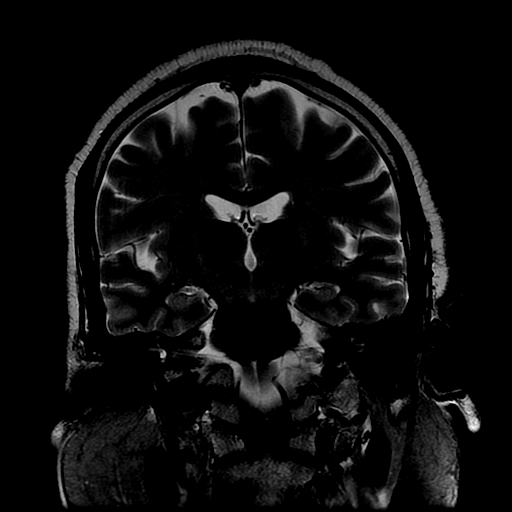
[im 24/24]
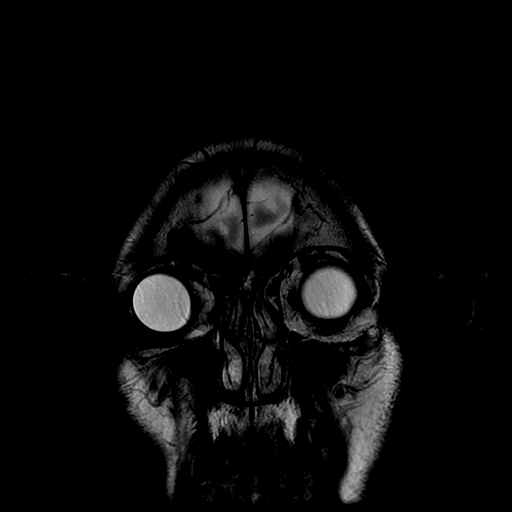

[Series 15: T1 · axial · 3.0mm · 0.31mm/px · 1 of 13 slices shown (4 of 6)]
[im 1/13]
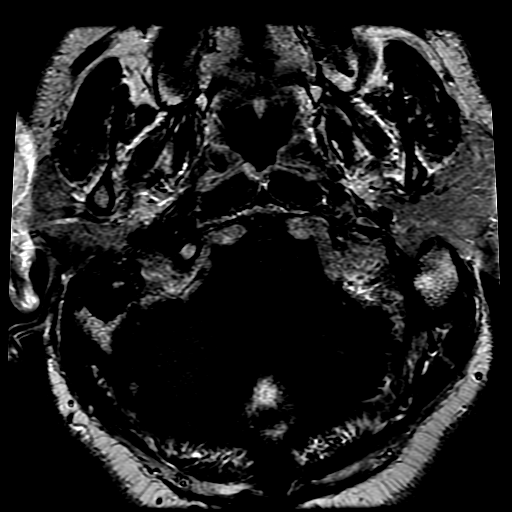

[Series 16: T1 · coronal · 5.0mm · 0.39mm/px · 3 of 24 slices shown (5 of 6)]
[im 1/24]
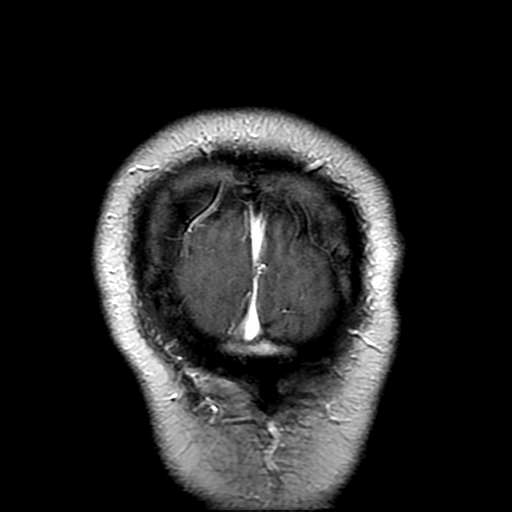
[im 12/24]
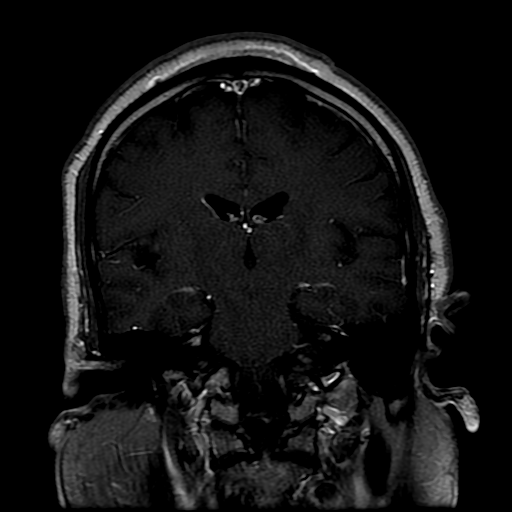
[im 24/24]
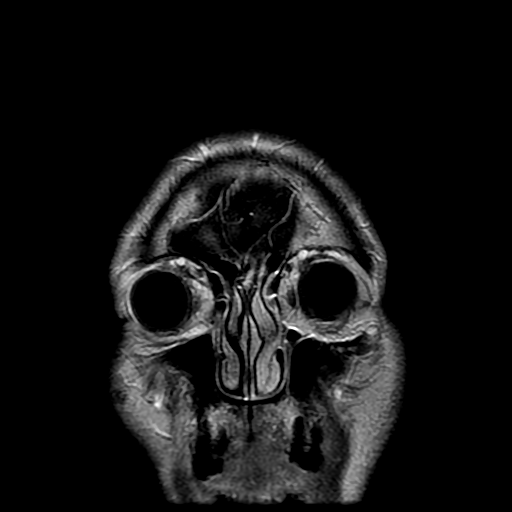

[Series 17: T1 · coronal · 3.0mm · 0.33mm/px · 1 of 10 slices shown (6 of 6)]
[im 1/10]
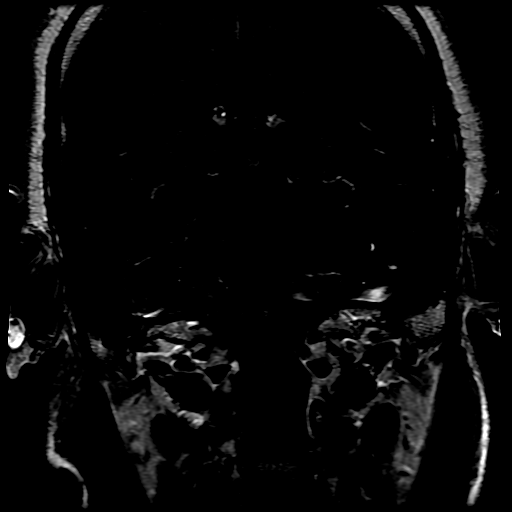

[Series 400: DWI · axial · 5.0mm · 1.09mm/px · z∈[-66,+87]mm · 3 of 29 slices shown (2 of 2)]
[im 1/29]
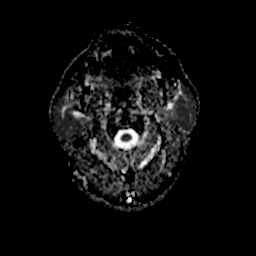
[im 15/29]
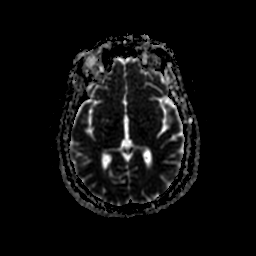
[im 29/29]
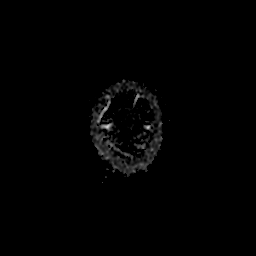

[26 of 48 positions shown; findings below may reference images not displayed]

FINDINGS: Ventricle size is normal.  Small hyperintensity in the
right frontal white matter, not likely clinically significant.
This may be due to chronic ischemia.  No other white matter lesions
are identified.  The brainstem and posterior fossa are normal.
There is no hemorrhage or mass lesion

Thin sections were obtained the posterior fossa before and after
intravenous contrast.  Negative for acoustic neuroma.  The seventh
and eighth cranial nerves are normal.  The brainstem is normal.
The cisternal segment of the trigeminal nerve is normal in size
bilaterally.  The cavernous sinus is symmetric and normal
appearing.  The cerebellum is normal.  The mastoid sinus is
normally aerated bilaterally.
IMPRESSION: No significant abnormality.  No cause of vertigo is identified.

## 2011-03-21 ENCOUNTER — Encounter: Payer: Self-pay | Admitting: Cardiovascular Disease

## 2011-03-29 ENCOUNTER — Ambulatory Visit (INDEPENDENT_AMBULATORY_CARE_PROVIDER_SITE_OTHER): Payer: Self-pay | Admitting: Cardiovascular Disease

## 2011-03-29 ENCOUNTER — Encounter: Payer: Self-pay | Admitting: Cardiovascular Disease

## 2011-03-29 VITALS — BP 159/98 | HR 85 | Resp 14 | Ht 71.0 in | Wt 189.0 lb

## 2011-03-29 DIAGNOSIS — G459 Transient cerebral ischemic attack, unspecified: Secondary | ICD-10-CM

## 2011-03-29 MED ORDER — MECLIZINE HCL 25 MG PO TABS: 25.0000 mg | ORAL_TABLET | Freq: Every day | ORAL | Status: DC | PRN

## 2011-03-29 MED ORDER — PRAVASTATIN SODIUM 40 MG PO TABS: 40.0000 mg | ORAL_TABLET | Freq: Every day | ORAL | Status: DC

## 2011-03-29 MED ORDER — LISINOPRIL 40 MG PO TABS: 40.0000 mg | ORAL_TABLET | Freq: Every day | ORAL | Status: DC

## 2011-03-29 NOTE — Assessment & Plan Note (Signed)
Well controlled.  Continue current medications and low sodium Dash type diet.    

## 2011-03-29 NOTE — Progress Notes (Signed)
Russell Carter is seen today for F/U HTN, elevated lipids and SSCP. Cath on 527/11 showed non obstructive disease with 50% mid LAD stenosis. He has had a SEMI in the past when he did cocaine. His D/C meds included lipitor, plavix and tircor which totalled over $300's. I told him to not to take any of these. He was placed on generic Pravachol and an adult aspirin. He is not having any SSCP.  Seen in ER 4/12 with ? TIA.  Or vertigo.  MRA negative.  Needs neuro F/U.  And refills  ROS: Denies fever, malais, weight loss, blurry vision, decreased visual acuity, cough, sputum, SOB, hemoptysis, pleuritic pain, palpitaitons, heartburn, abdominal pain, melena, lower extremity edema, claudication, or rash.  All other systems reviewed and negative  General: Affect appropriate Healthy:  appears stated age HEENT: normal Neck supple with no adenopathy JVP normal no bruits no thyromegaly Lungs clear with no wheezing and good diaphragmatic motion Heart:  S1/S2 no murmur,rub, gallop or click PMI normal Abdomen: benighn, BS positve, no tenderness, no AAA no bruit.  No HSM or HJR Distal pulses intact with no bruits No edema Neuro non-focal Skin warm and dry No muscular weakness   Current Outpatient Prescriptions  Medication Sig Dispense Refill  . aspirin EC 325 MG tablet Take 325 mg by mouth daily.        Marland Kitchen lisinopril (PRINIVIL,ZESTRIL) 40 MG tablet Take 40 mg by mouth daily.        . meclizine (ANTIVERT) 25 MG tablet Take 25 mg by mouth daily as needed.        . nitroGLYCERIN (NITROSTAT) 0.4 MG SL tablet Place 0.4 mg under the tongue every 5 (five) minutes as needed.        Marland Kitchen oxyCODONE-acetaminophen (PERCOCET) 5-325 MG per tablet Take 1 tablet by mouth every 4 (four) hours as needed.        . pravastatin (PRAVACHOL) 40 MG tablet Take 40 mg by mouth daily.        Marland Kitchen DISCONTD: carvedilol (COREG) 3.125 MG tablet Take 3.125 mg by mouth 2 (two) times daily.        Marland Kitchen DISCONTD: cyclobenzaprine (FLEXERIL) 10 MG tablet  Take 10 mg by mouth daily as needed.        Marland Kitchen DISCONTD: FLUoxetine (PROZAC) 20 MG capsule Take 20 mg by mouth daily.        Marland Kitchen DISCONTD: indomethacin (INDOCIN) 50 MG capsule Take 50 mg by mouth as needed.        Marland Kitchen DISCONTD: meloxicam (MOBIC) 15 MG tablet Take 15 mg by mouth daily.          Allergies  Review of patient's allergies indicates no known allergies.  Electrocardiogram:  4/1 NSR 71 normal ECG  Assessment and Plan

## 2011-03-29 NOTE — Assessment & Plan Note (Signed)
Cholesterol is at goal.  Continue current dose of statin and diet Rx.  No myalgias or side effects.  F/U  LFT's in 6 months. Lab Results  Component Value Date   LDLCALC  Value: 157        Total Cholesterol/HDL:CHD Risk Coronary Heart Disease Risk Table                     Men   Women  1/2 Average Risk   3.4   3.3  Average Risk       5.0   4.4  2 X Average Risk   9.6   7.1  3 X Average Risk  23.4   11.0        Use the calculated Patient Ratio above and the CHD Risk Table to determine the patient's CHD Risk.        ATP III CLASSIFICATION (LDL):  <100     mg/dL   Optimal  100-129  mg/dL   Near or Above                    Optimal  130-159  mg/dL   Borderline  160-189  mg/dL   High  >190     mg/dL   Very High* 01/22/2011             

## 2011-03-29 NOTE — Assessment & Plan Note (Signed)
Stable with no angina and good activity level.  Continue medical Rx  

## 2011-03-29 NOTE — Patient Instructions (Signed)
Your physician wants you to follow-up in: ONE YEAR You will receive a reminder letter in the mail two months in advance. If you don't receive a letter, please call our office to schedule the follow-up appointment.  FOLLOW UP WITH NEUROLOGY FOR VERTIGO AND TIA

## 2011-03-29 NOTE — Assessment & Plan Note (Signed)
Vertigo vs TIA numbness in left arm Needs neuro F/U

## 2011-04-04 ENCOUNTER — Telehealth: Payer: Self-pay | Admitting: Cardiovascular Disease

## 2011-04-04 NOTE — Telephone Encounter (Signed)
Pt was told dr Eden Emms would have nurse call him re a referral to a neuro surgeon re his mini strokes, it's been a week and hasn't heard anything-pls call

## 2011-04-04 NOTE — Telephone Encounter (Signed)
Left message for pt that once records sent to neuro make the appt and can some times take longer than one week to get that appt. Will make sure records have been sent Russell Carter

## 2011-04-05 NOTE — Consult Note (Addendum)
Russell Carter, Russell Carter               ACCOUNT NO.:  [id number]  MEDICAL RECORD NO.:  [id number]           PATIENT TYPE:  E  LOCATION:  [facility]                         FACILITY:  [facility]  PHYSICIAN:  Mellody Drown, MD      DATE OF BIRTH:  01/10/1956  DATE OF CONSULTATION:  01/21/2011 DATE OF DISCHARGE:                                CONSULTATION   REASON FOR CONSULT:  Left facial droop and slurred speech.  HISTORY OF PRESENT ILLNESS:  This is a 55 year old right-hand dominant African American male who presents to ER after fall today.  The patient states he has had multiple falls and this is his one of many, he fell today on an outstretched hand and he felt like he hurt his wrist and that is what brought him to the ER.  The patient reports he has had dizziness for about 2-1/2 years, it has been about the same where he gets about 1-2 episodes for a week in which he falls with these.  It starts with imbalance, vertigo, and then, sometimes he falls to the floor.  It is questionable reconciliation events.  There is no postictal confusion or incontinence or injuries.  He has hurt his hand and wrists, but he is always bracing himself so he has never hit his head.  He does have palpitations with these and usually gets sharp pain in the back of his neck first before becomes dizzy and he becomes diaphoretic and sometimes he passes out.  However, today while an x-ray, he noted that he had another one of these episodes where he got a pain the back of his neck, became dizzy, diaphoretic, but then his left face became numb and he could not talk and it lasted about 10 minutes and he is still having left-sided numbness now.  He states he sometimes gets better.  These episodes get better lying down, but all of them occur with standing, however, this one today was more different.  He does report being on meclizine as outpatient which has helped, otherwise nothing else.  PAST MEDICAL HISTORY:  Significant  for hypertension, hyperlipidemia, prior MI, and drug use which in the past.  He reports some eye surgery 2 in the past, but it cannot relate what they are.  He said he has had double vision as a child and they too had it corrected several times.  PAST SURGICAL HISTORY:  As above.  MEDICATIONS:  He is on lisinopril, pravastatin, Coreg, indomethacin, meclizine, and aspirin.  ALLERGIES:  No known drug allergies.  FAMILY HISTORY:  No strokes or cancer.  SOCIAL HISTORY:  Unemployed.  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  REVIEW OF SYSTEMS:  Negative x10 systems except as noted per HPI.  PHYSICAL EXAMINATION:  VITAL SIGNS:  Temperature 99.1, blood pressure 138/92, pulse 80, respirations 18, sat 97% on room air. GENERAL:  He is in no acute distress. HEENT:  Sclerae nonicteric.  Oropharynx is clear. NECK:  Supple. CHEST:  Clear to auscultation bilaterally.  No wheezes. CARDIOVASCULAR:  Regular  rate and rhythm.  No murmurs. ABDOMEN:  Soft, nontender, nondistended. EXTREMITIES:  No cyanosis, clubbing, or edema. SKIN:  There is some scarring, lesions all over shins noted. NEUROLOGIC:  Alert and oriented x4.  Normal speech, normal language. His pupils are equal and reactive at 2 mm bilateral extraocular movements intact except he has a left partial cranial nerve III palsy and he stated this occurred since he has had strabismus as a child. Vision fields are normal.  There is no palpebral edema.  Face is symmetric.  Tongue is midline.  Shoulder shrug is equal bilaterally.  MOTOR:  He has 5/5 bilaterally.  Normal tone.  Of note, he does have some negative myoclonus on outstretched hands.  Reflexes are decreased throughout 1+ to 0 distally.  Plantars downgoing bilaterally. Cerebellar, finger-to-nose and heel-to-shin is intact.  Gait was not tested at this time.  SENSORY:  He had decreased pinprick  in a stocking glove pattern.  Of note, he does have decreased sensation along with temperature and pinprick on the left V1, V2, and consistent V3 was normal.  He has changes also on the left C5-C6 territory, otherwise normal.  LABS:  Essentially normal except for UDS that was positive for THCs.  MRI was personally reviewed by me and noted not to have any acute stroke.  He did have a small flare change that looked like an isolated event in the occipital lobe that was old otherwise, unremarkable MRI and MRA of the brain.  MRA showed that he has left-ward dominant, otherwise the vessels were good and patent.  ASSESSMENT: 1. Left-sided numbness, etiology unknown, could be cardiac versus     radiculopathy, but this does not fall into the typical neurologic     patterns and pathways. 2. Syncope.  These episodes do not appear to be seizures by history     with clear palpitations and no postictal confusion and need a     syncopal workup. 3. Possible neurology, etiology is unknown.  PLAN:  MRI C-spine.  Continue cardiac workup with enzymes x3. Orthostatic vitals, telemetry, 2-D echo, and check a hemoglobin A1c, TSH, B12, folate, SPEP, and heavy metals.  The patient can get a EMG as an outpatient.  We will continue to follow with you.  Thank you for the consult.          ______________________________ Mellody Drown, MD     MS/MEDQ  D:  01/21/2011  T:  01/22/2011  Job:  [id number]  Electronically Signed by Mellody Drown MD on 04/05/2011 11:25:28 AM

## 2011-06-27 ENCOUNTER — Other Ambulatory Visit: Payer: Self-pay | Admitting: Cardiovascular Disease

## 2011-06-27 MED ORDER — NITROGLYCERIN 0.4 MG SL SUBL: 0.4000 mg | SUBLINGUAL_TABLET | SUBLINGUAL | Status: DC | PRN

## 2011-07-11 LAB — I-STAT 8, (EC8 V) (CONVERTED LAB)
Chloride: 110
Glucose, Bld: 111 — ABNORMAL HIGH
Potassium: 5.2 — ABNORMAL HIGH
pH, Ven: 7.439 — ABNORMAL HIGH

## 2011-07-11 LAB — RAPID URINE DRUG SCREEN, HOSP PERFORMED
Barbiturates: NOT DETECTED
Cocaine: NOT DETECTED
Opiates: NOT DETECTED

## 2011-07-11 LAB — D-DIMER, QUANTITATIVE: D-Dimer, Quant: 0.41

## 2011-07-11 LAB — POCT I-STAT CREATININE
Creatinine, Ser: 1.4
Operator id: 198171

## 2011-07-11 LAB — CBC
Hemoglobin: 15.4
MCHC: 34.3
RBC: 5.03

## 2011-07-11 LAB — LIPID PANEL
Cholesterol: 264 — ABNORMAL HIGH
HDL: 52
Triglycerides: 120

## 2011-07-11 LAB — TROPONIN I: Troponin I: 0.04

## 2011-07-11 LAB — CARDIAC PANEL(CRET KIN+CKTOT+MB+TROPI)
Relative Index: 0.9
Troponin I: 0.02

## 2011-07-11 LAB — PROTIME-INR: Prothrombin Time: 13.9

## 2011-07-11 LAB — POCT CARDIAC MARKERS: Myoglobin, poc: 93.2

## 2011-07-11 LAB — APTT: aPTT: 26

## 2011-07-23 IMAGING — CR DG FOOT COMPLETE 3+V*R*
3 series · 3 of 3 positions shown · non-contrast
Comparison: None.

CLINICAL DATA: Trauma.  Pain at fourth and fifth metatarsals.

RIGHT FOOT COMPLETE - 3+ VIEW

[t foot ap right]
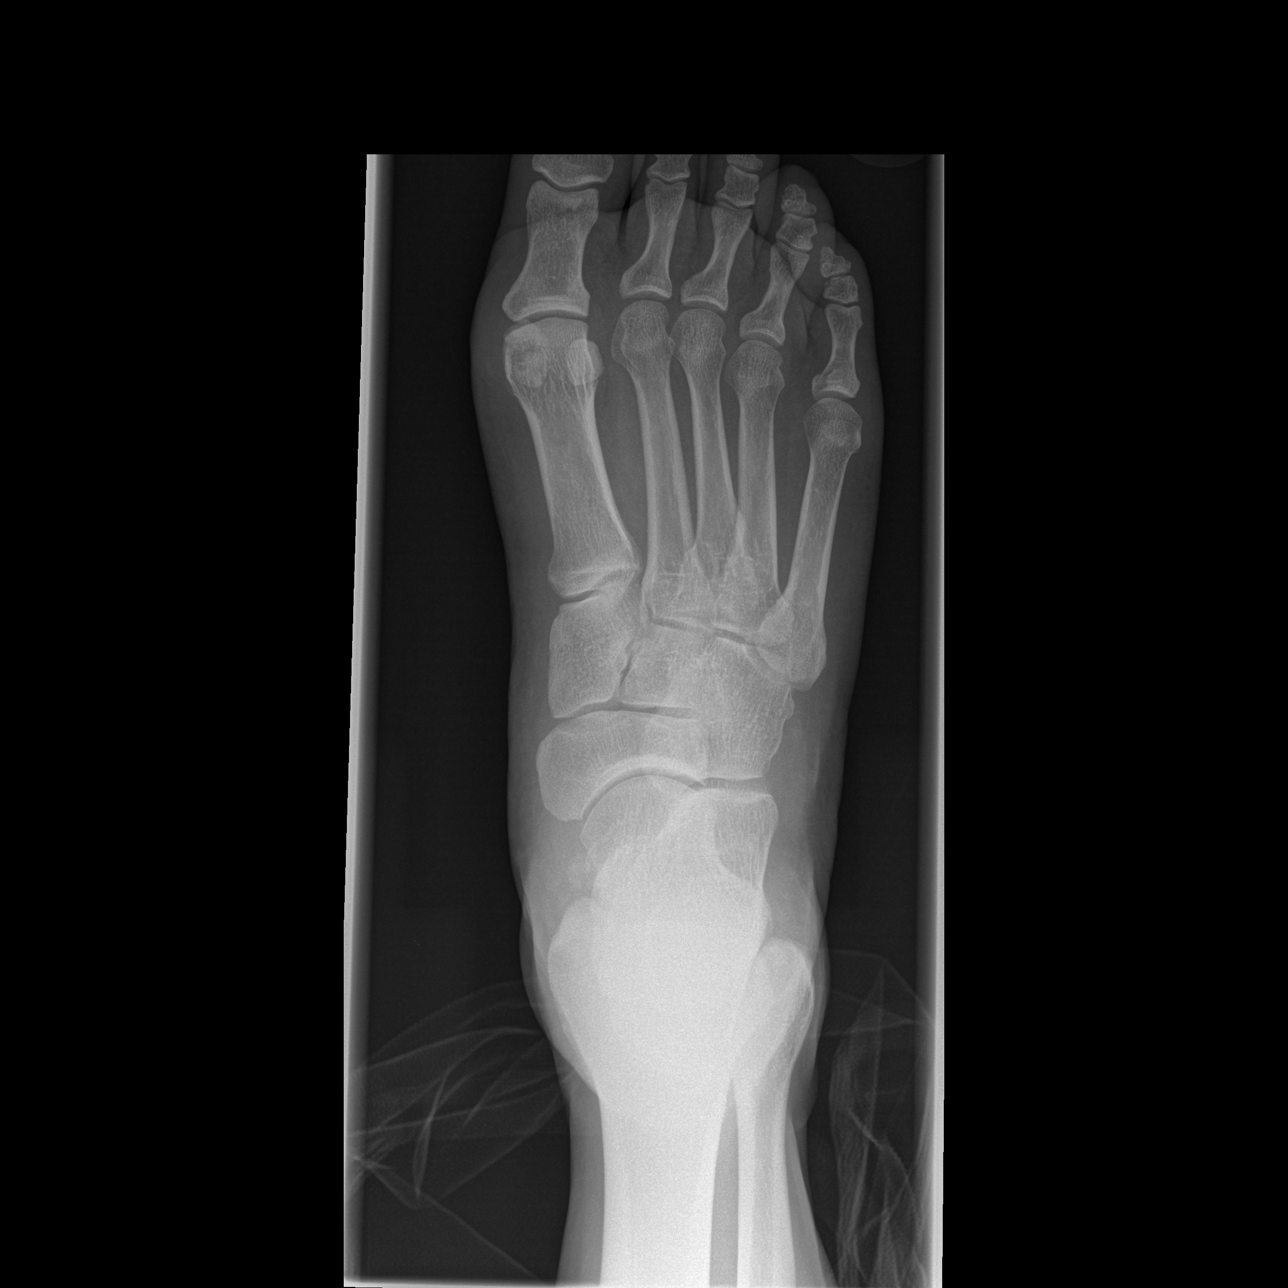

[t foot oblique right]
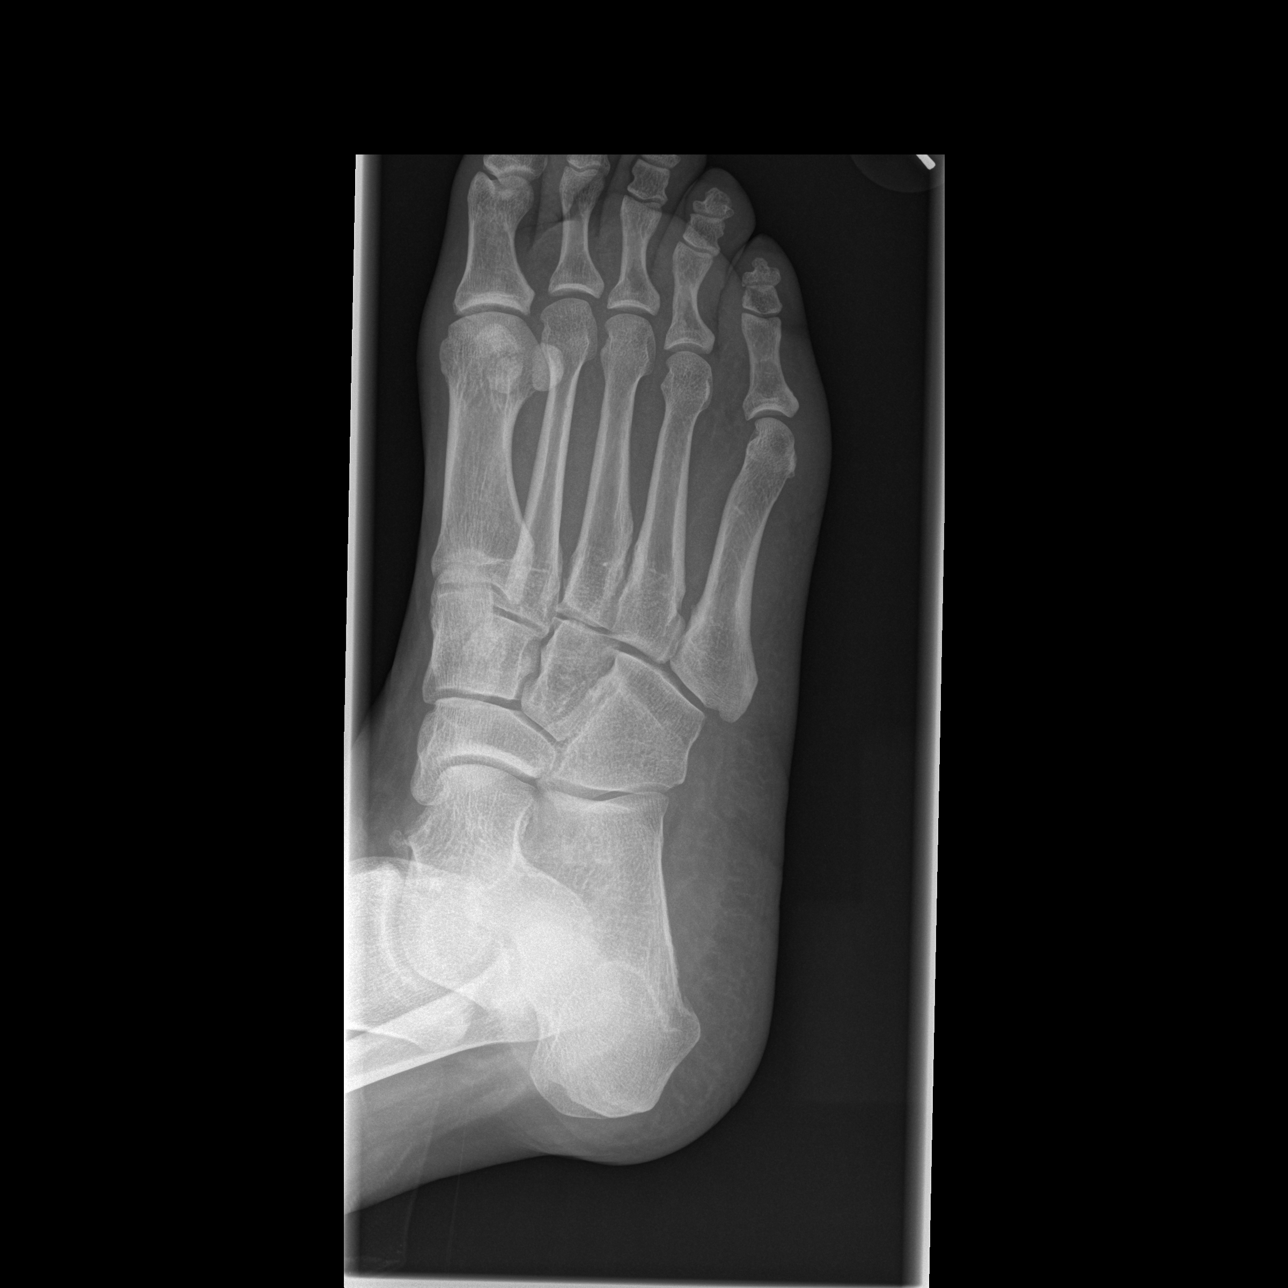

[t foot lat right]
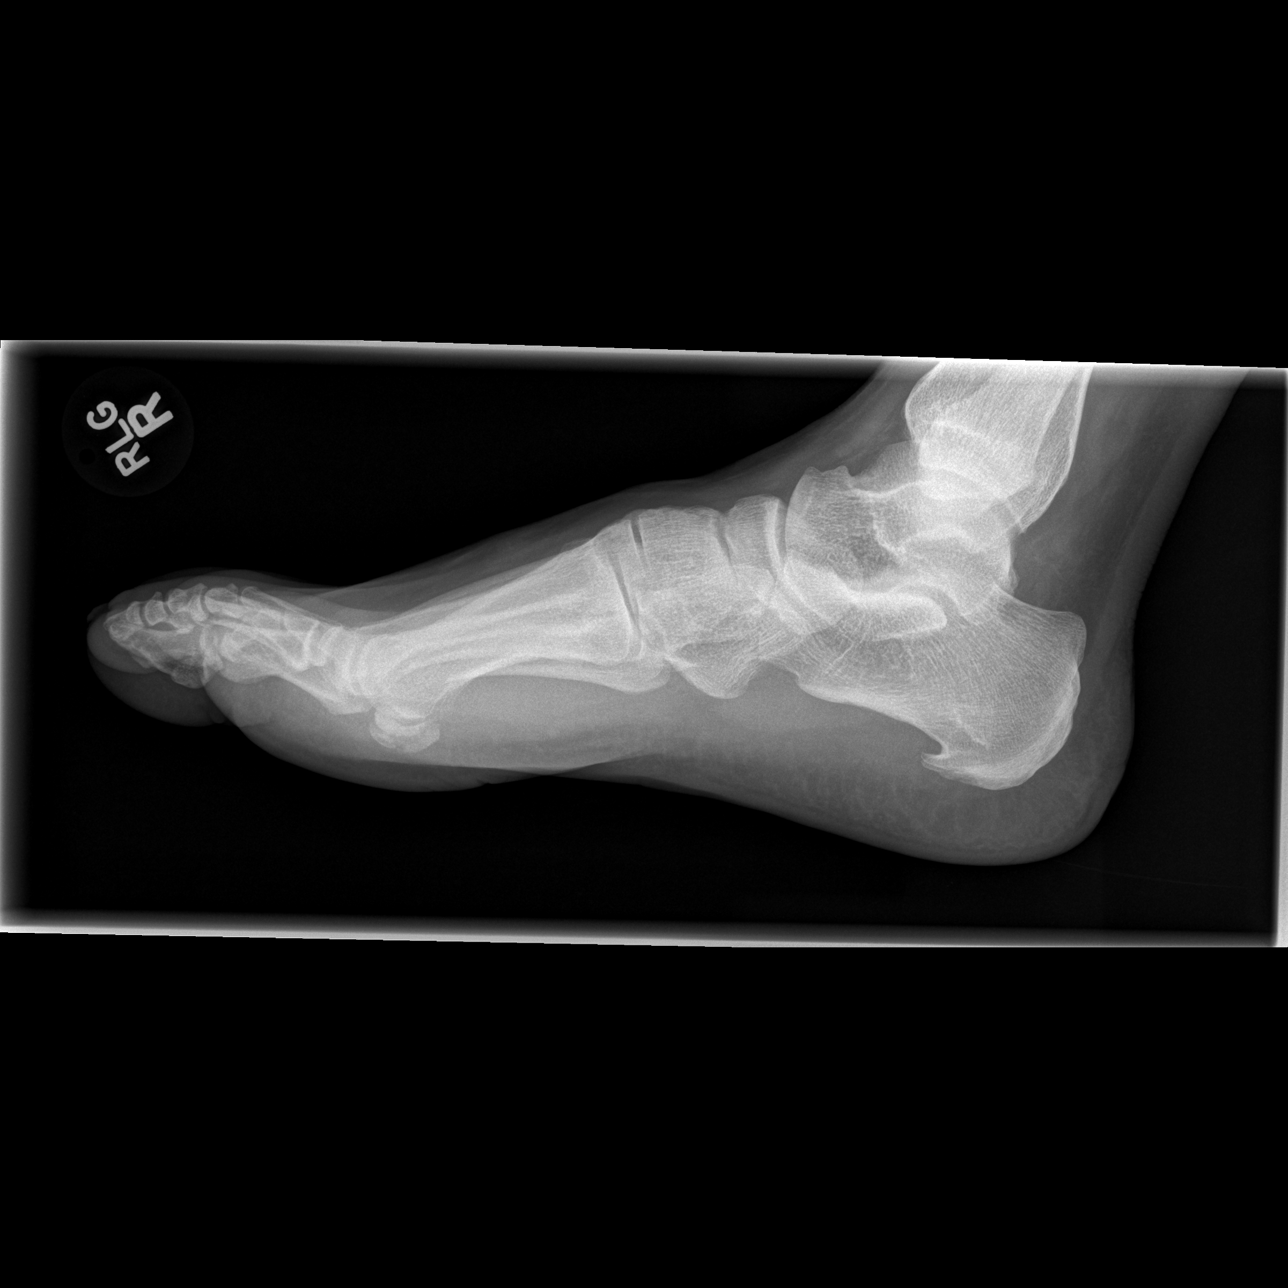

[3 of 3 positions shown; findings below may reference images not displayed]

FINDINGS: Small calcaneal spur. No acute fracture or dislocation.
IMPRESSION: No acute findings about the right foot.

## 2011-07-24 LAB — RAPID URINE DRUG SCREEN, HOSP PERFORMED
Benzodiazepines: NOT DETECTED
Cocaine: POSITIVE — AB
Opiates: NOT DETECTED

## 2011-07-24 LAB — POCT CARDIAC MARKERS: Troponin i, poc: <0.05

## 2011-07-24 LAB — POCT I-STAT, CHEM 8
BUN: 14
Creatinine, Ser: 1.4
Glucose, Bld: 98
Hemoglobin: 17
Potassium: 4.2
TCO2: 24

## 2011-08-06 LAB — CARDIAC PANEL(CRET KIN+CKTOT+MB+TROPI)
CK, MB: 1.1
CK, MB: 1.9
Relative Index: INVALID
Total CK: 108
Total CK: 130
Total CK: 78
Troponin I: 0.01
Troponin I: 0.01

## 2011-08-06 LAB — CBC
HCT: 40.8
Hemoglobin: 13.9
MCHC: 33.9
MCHC: 34
MCV: 90.8
MCV: 90.8
Platelets: 255
RBC: 4.49
RDW: 13.7
WBC: 6.6

## 2011-08-06 LAB — I-STAT 8, (EC8 V) (CONVERTED LAB)
BUN: 22
Bicarbonate: 24.5 — ABNORMAL HIGH
Chloride: 106
Glucose, Bld: 113 — ABNORMAL HIGH
pCO2, Ven: 38.7 — ABNORMAL LOW
pH, Ven: 7.41 — ABNORMAL HIGH

## 2011-08-06 LAB — COMPREHENSIVE METABOLIC PANEL
ALT: 18
AST: 19
CO2: 23
Calcium: 9.5
Creatinine, Ser: 1.31
GFR calc Af Amer: >60
GFR calc non Af Amer: 57 — ABNORMAL LOW
Glucose, Bld: 156 — ABNORMAL HIGH
Sodium: 137
Total Protein: 6.3

## 2011-08-06 LAB — POCT CARDIAC MARKERS
Myoglobin, poc: 62.8
Myoglobin, poc: 70.3
Operator id: 133351
Operator id: 133351
Troponin i, poc: <0.05

## 2011-08-06 LAB — TSH: TSH: 1.547

## 2011-08-06 LAB — DIFFERENTIAL
Basophils Absolute: 0
Basophils Relative: 1
Eosinophils Absolute: 0.1
Lymphs Abs: 2.2
Neutrophils Relative %: 55

## 2011-08-06 LAB — PROTIME-INR: Prothrombin Time: 14.1

## 2011-08-06 LAB — LIPASE, BLOOD: Lipase: 21

## 2011-08-06 LAB — CK TOTAL AND CKMB (NOT AT ARMC)
CK, MB: 2.5
Total CK: 172

## 2011-08-06 LAB — POCT I-STAT CREATININE: Creatinine, Ser: 1.4

## 2011-08-06 LAB — HEMOGLOBIN A1C: Hgb A1c MFr Bld: 5.8

## 2011-08-06 LAB — D-DIMER, QUANTITATIVE: D-Dimer, Quant: <0.22

## 2011-08-06 LAB — RAPID URINE DRUG SCREEN, HOSP PERFORMED: Barbiturates: NOT DETECTED

## 2011-08-22 ENCOUNTER — Encounter (INDEPENDENT_AMBULATORY_CARE_PROVIDER_SITE_OTHER): Payer: Self-pay | Admitting: General Surgery

## 2011-08-22 ENCOUNTER — Ambulatory Visit (INDEPENDENT_AMBULATORY_CARE_PROVIDER_SITE_OTHER): Payer: PRIVATE HEALTH INSURANCE | Admitting: General Surgery

## 2011-08-22 VITALS — BP 144/98 | HR 60 | Temp 96.3°F | Resp 20 | Ht 71.5 in | Wt 206.4 lb

## 2011-08-22 DIAGNOSIS — K4090 Unilateral inguinal hernia, without obstruction or gangrene, not specified as recurrent: Secondary | ICD-10-CM

## 2011-08-22 MED ORDER — OXYCODONE-ACETAMINOPHEN 5-325 MG PO TABS: 2.0000 | ORAL_TABLET | Freq: Four times a day (QID) | ORAL | Status: DC | PRN

## 2011-08-22 NOTE — Progress Notes (Signed)
Subjective:     Patient ID: Russell Carter, male   DOB: 05-Apr-1956, 55 y.o.   MRN: [id number]  HPI Patient is well-known to me status post repair of recurrent left inguinal hernia with mesh in August of 2005. Since that time he has been followed by [facility]. He does have a history of hypertension as well as to what sound like TIA events in the past. 6 weeks ago he developed a bulge in his right groin. It is gradually been getting larger. It is been quite painful for the past week or so. The pain is exacerbated by heavy activity. He has not had a change in bowel or bladder habits.The mass seems to go away when he lays down.  Review of Systems  Constitutional: Negative for fever, chills and unexpected weight change.  HENT: Negative for hearing loss, congestion, sore throat, trouble swallowing and voice change.   Eyes: Negative for visual disturbance.  Respiratory: Negative for cough and wheezing.   Cardiovascular: Negative for chest pain, palpitations and leg swelling.  Gastrointestinal: Negative for nausea, vomiting, abdominal pain, diarrhea, constipation, blood in stool, abdominal distention, anal bleeding and rectal pain.  Genitourinary: Negative for hematuria and difficulty urinating.  Musculoskeletal: Negative for arthralgias.  Skin: Negative for rash and wound.  Neurological: Negative for seizures, syncope, weakness and headaches.  Hematological: Negative for adenopathy. Does not bruise/bleed easily.  Psychiatric/Behavioral: Negative for confusion.  Bulge with pain in right groin     Objective:   Physical Exam  Constitutional: He is oriented to person, place, and time. He appears well-developed and well-nourished. No distress.  HENT:  Head: Normocephalic and atraumatic.  Eyes: Conjunctivae and EOM are normal. Pupils are equal, round, and reactive to light.  Neck: Normal range of motion. Neck supple. No tracheal deviation present.  Cardiovascular: Normal rate, regular rhythm and  normal heart sounds.   No murmur heard. Pulmonary/Chest: Effort normal and breath sounds normal. No respiratory distress. He has no wheezes. He has no rales.  Abdominal: Soft. He exhibits no distension. There is no tenderness. There is no rebound and no guarding.  Musculoskeletal: Normal range of motion.  Neurological: He is alert and oriented to person, place, and time.  Inguinal exam reveals large right inguinal hernia extending into the top of the scrotum. This is fully reducible with mild discomfort. Both testes are descended and not edematous. Left inguinal exam reveals his old hernia repair remains intact.     Assessment:     Large right inguinal hernia which is increasingly symptomatic    Plan:     Right inguinal hernia repair with mesh. Procedure is contents were discussed in detail with the patient. He will need to hold his aspirin and indomethacin 5 days prior to surgery. Also prescribed oxycodone/APAP for pain as needed. We look forward to scheduling this as an outpatient procedure in the near future. The patient is agreeable.

## 2011-08-22 NOTE — Patient Instructions (Signed)
Stop aspirin and Indocin five days before surgery

## 2011-08-23 ENCOUNTER — Encounter (HOSPITAL_BASED_OUTPATIENT_CLINIC_OR_DEPARTMENT_OTHER): Payer: Self-pay | Admitting: Anesthesiology

## 2011-08-23 ENCOUNTER — Encounter (HOSPITAL_BASED_OUTPATIENT_CLINIC_OR_DEPARTMENT_OTHER): Payer: Self-pay | Admitting: *Deleted

## 2011-08-23 NOTE — Progress Notes (Addendum)
Chart reviewed with dr Gelene Mink, he advised call dr Janee Morn and advise need for neuro work up.  Spoke with dr Janee Morn. Dr Janee Morn will cancel surg. He will call pt to inform him.      Charting on this pt done by c rathbone rn under J, ricker rn number

## 2011-08-23 NOTE — Anesthesia Preprocedure Evaluation (Deleted)
Anesthesia Evaluation  Patient identified by MRN, date of birth, ID band Patient awake    Reviewed: Allergy & Precautions, H&P , NPO status , Patient's Chart, lab work & pertinent test results, reviewed documented beta blocker date and time   Airway Mallampati: II TM Distance: >3 FB Neck ROM: full    Dental No notable dental hx.    Pulmonary neg pulmonary ROS,    Pulmonary exam normal       Cardiovascular neg cardio ROS     Neuro/Psych Negative Neurological ROS  Negative Psych ROS   GI/Hepatic negative GI ROS, Neg liver ROS,   Endo/Other  Negative Endocrine ROS  Renal/GU negative Renal ROS  Genitourinary negative   Musculoskeletal   Abdominal   Peds  Hematology negative hematology ROS (+)   Anesthesia Other Findings   Reproductive/Obstetrics negative OB ROS                           Anesthesia Physical Anesthesia Plan  ASA: II  Anesthesia Plan: General   Post-op Pain Management:    Induction:   Airway Management Planned:   Additional Equipment:   Intra-op Plan:   Post-operative Plan:   Informed Consent: I have reviewed the patients History and Physical, chart, labs and discussed the procedure including the risks, benefits and alternatives for the proposed anesthesia with the patient or authorized representative who has indicated his/her understanding and acceptance.     Plan Discussed with: CRNA and Surgeon  Anesthesia Plan Comments:         Anesthesia Quick Evaluation

## 2011-08-23 NOTE — Anesthesia Preprocedure Evaluation (Deleted)
Anesthesia Evaluation    Airway       Dental   Pulmonary pneumonia ,          Cardiovascular     Neuro/Psych  Headaches, CVA    GI/Hepatic   Endo/Other    Renal/GU      Musculoskeletal   Abdominal   Peds  Hematology   Anesthesia Other Findings   Reproductive/Obstetrics                           Anesthesia Physical Anesthesia Plan Anesthesia Quick Evaluation

## 2011-08-24 ENCOUNTER — Telehealth (INDEPENDENT_AMBULATORY_CARE_PROVIDER_SITE_OTHER): Payer: Self-pay

## 2011-08-24 NOTE — Telephone Encounter (Signed)
Pt called EA:[id number] something for sinus headache. I advised pt he needs to contact his PCP or this request due to the medications he is on and his hx of stroke. Pt states he will call [facility] PCP.

## 2011-08-24 NOTE — Telephone Encounter (Signed)
Patient called in today because he was notified he needed to see a Neurologist preop before they would do surgery.  I told him that they have to cancel surgery because the anesthesiologist won't put him to sleep due to his recent TIA in September.  The patient states that he doesn't have the money to pay $200 to see the neurologist.  He uses [facility].  I told him surgery will have to be delayed until that can be taken care of.  He states it will take a while.  He wants to know if Dr Janee Morn has any advice on how he can do this or if we can offer financial assistance.  I told him if Dr Janee Morn has any suggestions we will call him back.  I notified Amy in surgery scheduling that surgery is cancelled for Monday.

## 2011-08-27 ENCOUNTER — Ambulatory Visit (HOSPITAL_BASED_OUTPATIENT_CLINIC_OR_DEPARTMENT_OTHER): Admission: RE | Admit: 2011-08-27 | Payer: Self-pay | Source: Ambulatory Visit | Admitting: General Surgery

## 2011-08-27 ENCOUNTER — Encounter (HOSPITAL_BASED_OUTPATIENT_CLINIC_OR_DEPARTMENT_OTHER): Admission: RE | Payer: Self-pay | Source: Ambulatory Visit

## 2011-08-27 HISTORY — DX: Pneumonia, unspecified organism: J18.9

## 2011-08-27 HISTORY — DX: Major depressive disorder, single episode, unspecified: F32.9

## 2011-08-27 HISTORY — DX: Depression, unspecified: F32.A

## 2011-08-27 HISTORY — DX: Unspecified osteoarthritis, unspecified site: M19.90

## 2011-08-27 SURGERY — REPAIR, HERNIA, INGUINAL, ADULT
Anesthesia: General | Laterality: Right

## 2011-08-27 NOTE — Telephone Encounter (Signed)
I called the pt and notified him that he should call [facility] and get a neuro check up there.  He will call them this pm.

## 2011-08-27 NOTE — Telephone Encounter (Signed)
Russell Carter may be able to have his neuro work-up at [facility]. He should check with them.

## 2011-09-20 ENCOUNTER — Telehealth (INDEPENDENT_AMBULATORY_CARE_PROVIDER_SITE_OTHER): Payer: Self-pay | Admitting: General Surgery

## 2011-09-20 NOTE — Telephone Encounter (Signed)
Russell Carter CALLED TO ASK FOR REFILL OF HIS PAIN MEDICATION/PERCOCET/ DR. THOMPSON NOTIFIED AND HE GAVE AN ORDER FOR VICODIN 5/325 #50 TO BE CALLED INTO PHARMACY/RX CALLED TO [facility]/[address]/ [phone]/ PT NOTIFIED OF CHANGE AND CALL IN.

## 2011-09-30 IMAGING — CR DG CHEST 1V PORT
1 series · 1 of 1 positions shown · non-contrast
Comparison: 03/16/2010

CLINICAL DATA: Chest pain.

PORTABLE CHEST - 1 VIEW

[AP]
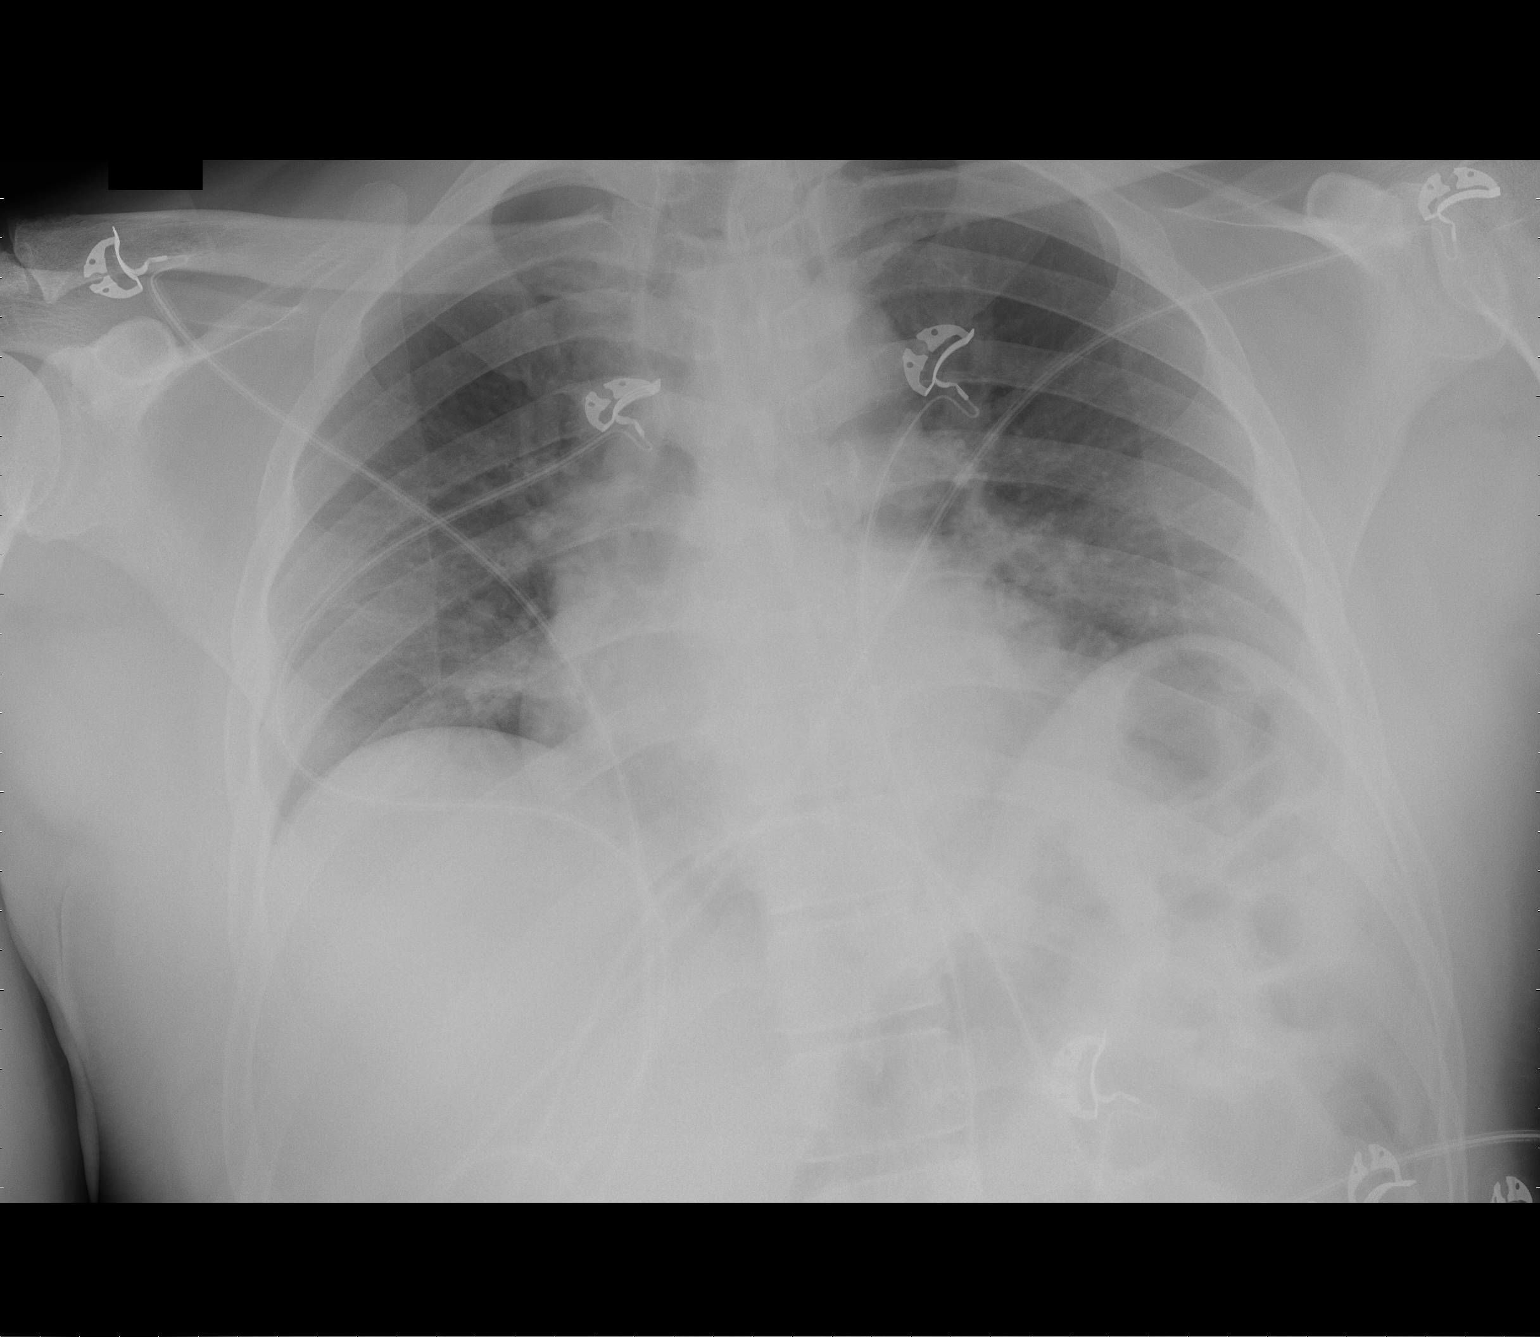

[1 of 1 positions shown; findings below may reference images not displayed]

FINDINGS: There are low lung volumes.  Mild vascular congestion and
bibasilar atelectasis.  Heart size accentuated by the low volumes,
likely within normal limits.
IMPRESSION: Low lung volumes with vascular congestion and bibasilar
atelectasis.

## 2011-10-25 IMAGING — CR DG WRIST COMPLETE 3+V*R*
4 series · 4 of 4 positions shown · non-contrast
Comparison: 03/17/11

CLINICAL DATA: Trauma, pain

RIGHT WRIST - COMPLETE 3+ VIEW

[x wrist pa right]
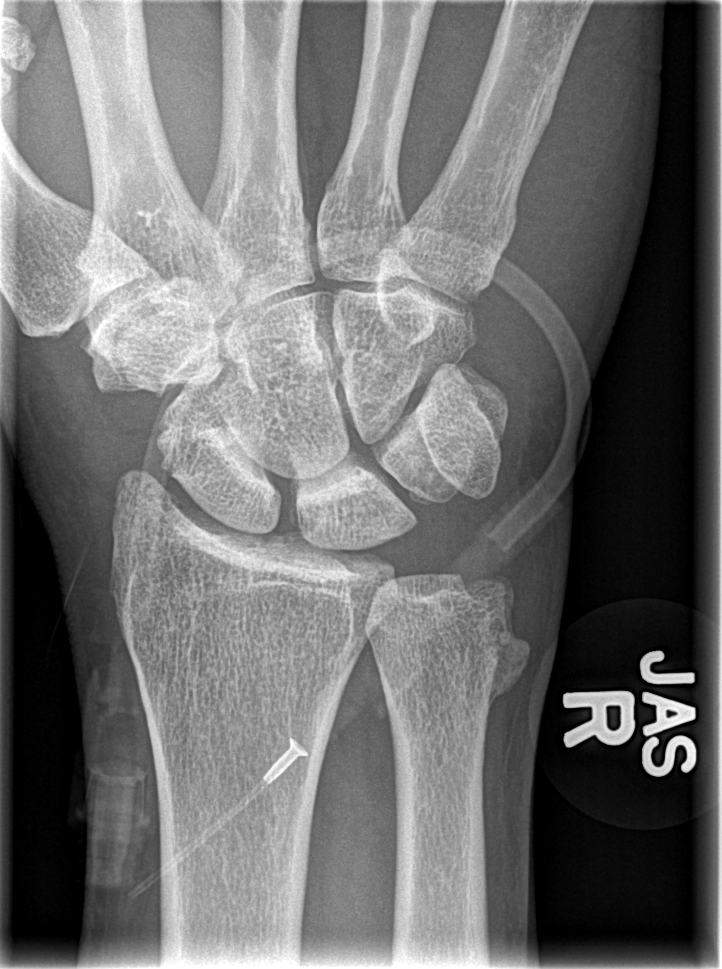

[x wrist obl right]
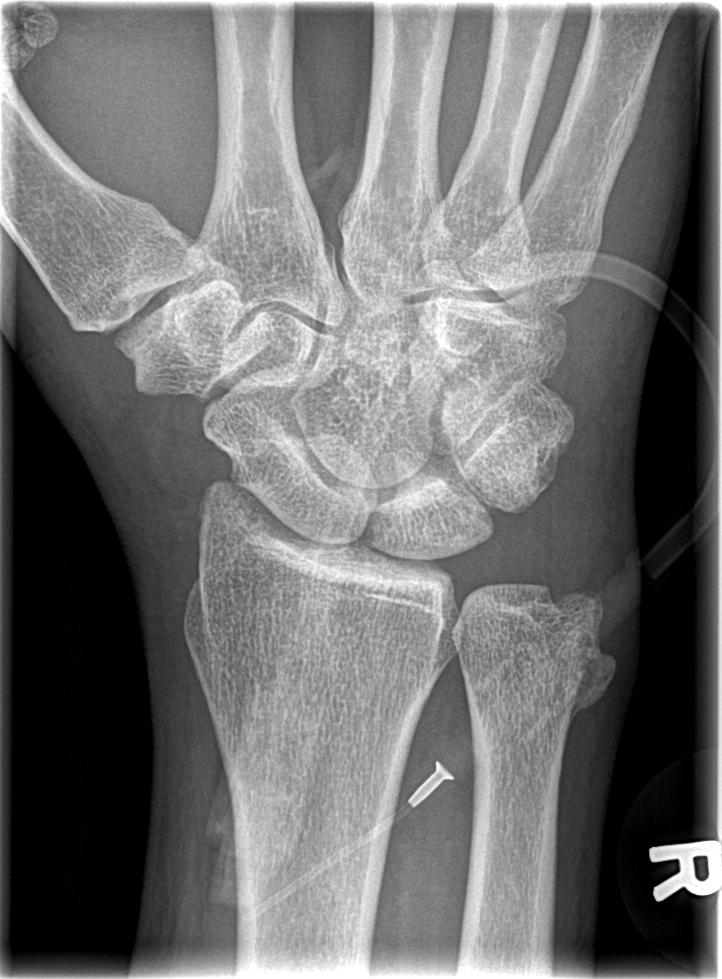

[x wrist lat right]
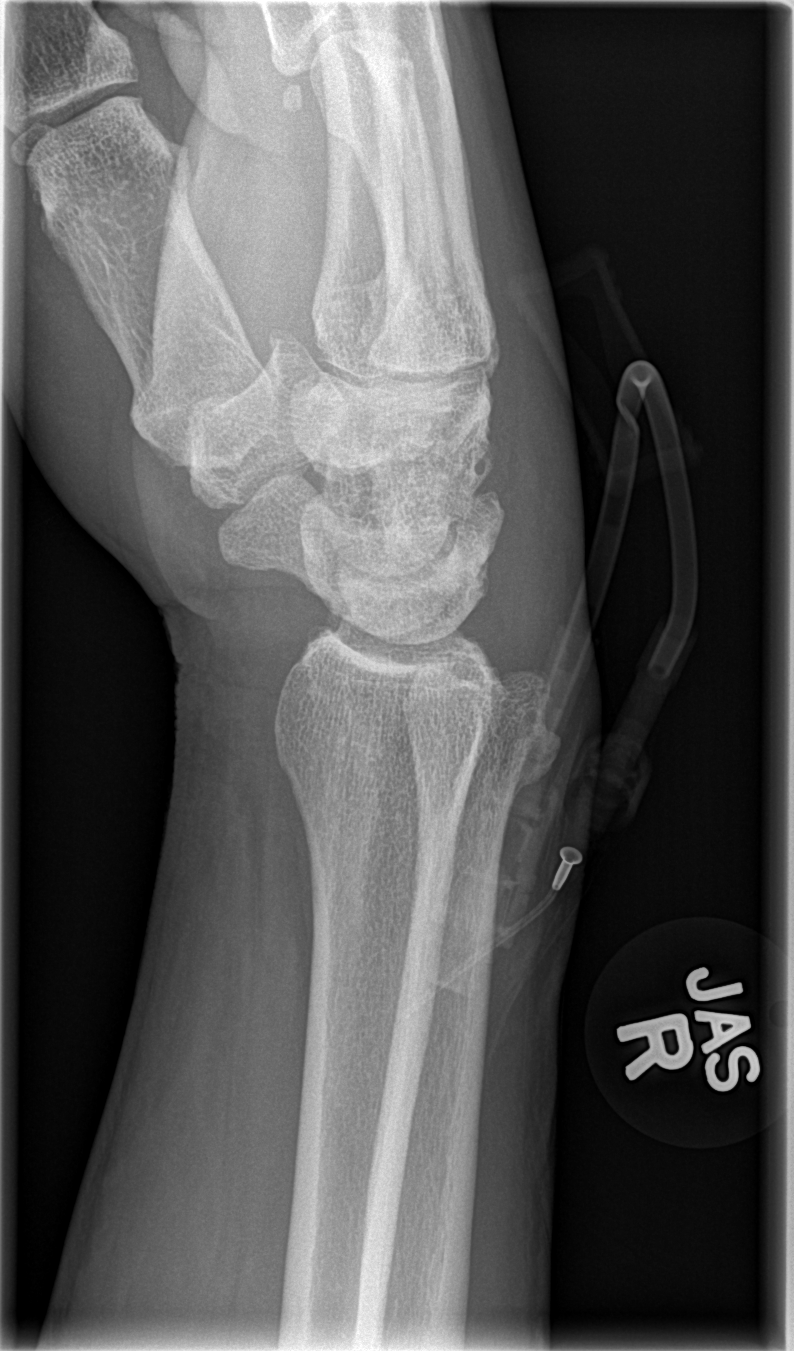

[x navicular]
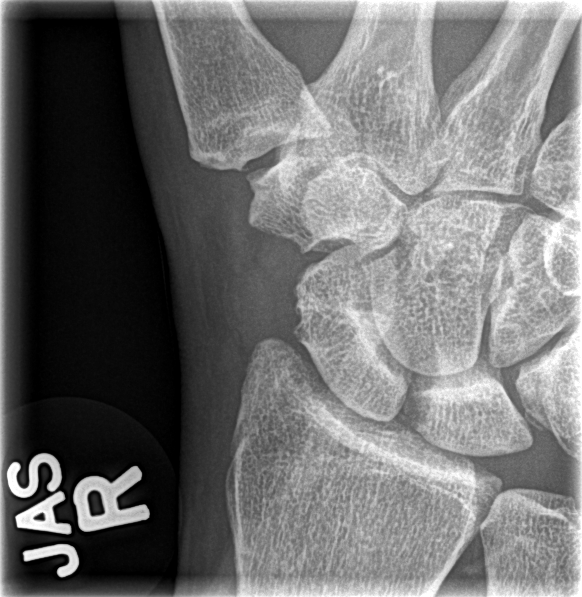

[4 of 4 positions shown; findings below may reference images not displayed]

FINDINGS: Mild degenerative changes.  Normal alignment.  No
fracture identified.  Intact distal radius, ulna and carpal bones.
IMPRESSION: No acute osseous finding.

## 2011-10-25 IMAGING — CR DG WRIST COMPLETE 3+V*L*
4 series · 4 of 4 positions shown · non-contrast
Comparison: None.

CLINICAL DATA: Trauma, left wrist pain

LEFT WRIST - COMPLETE 3+ VIEW

[x wrist pa left]
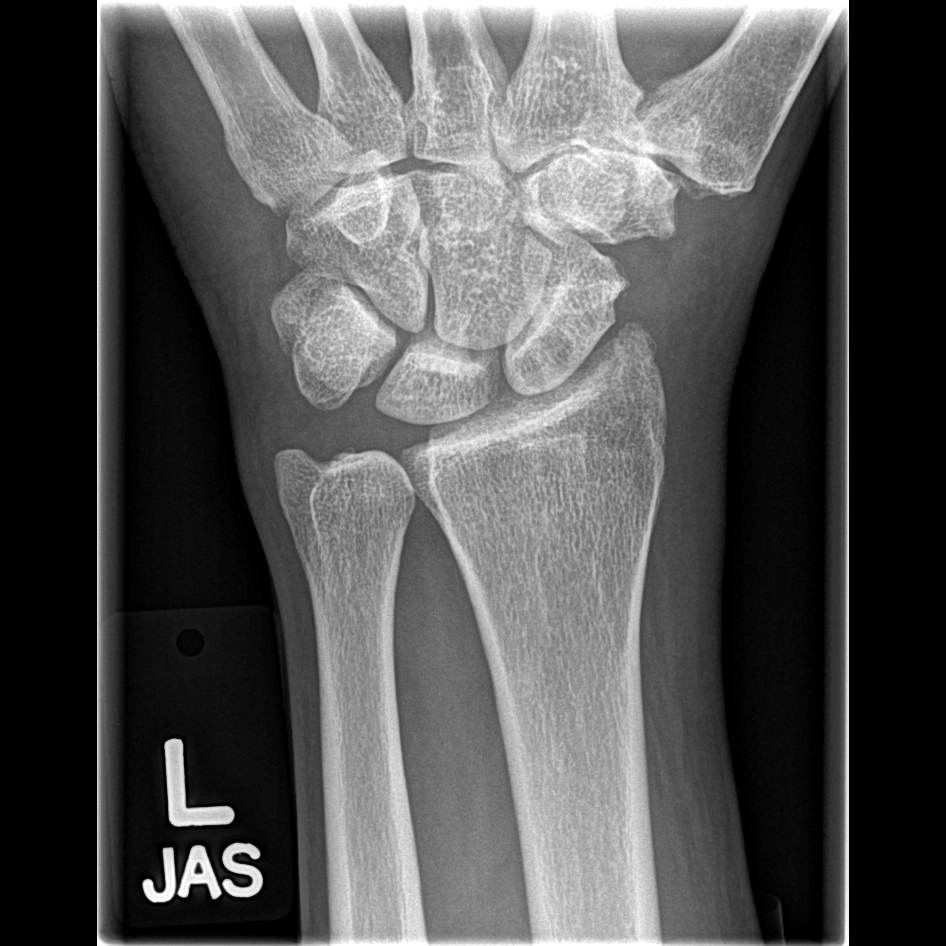

[x wrist obl left]
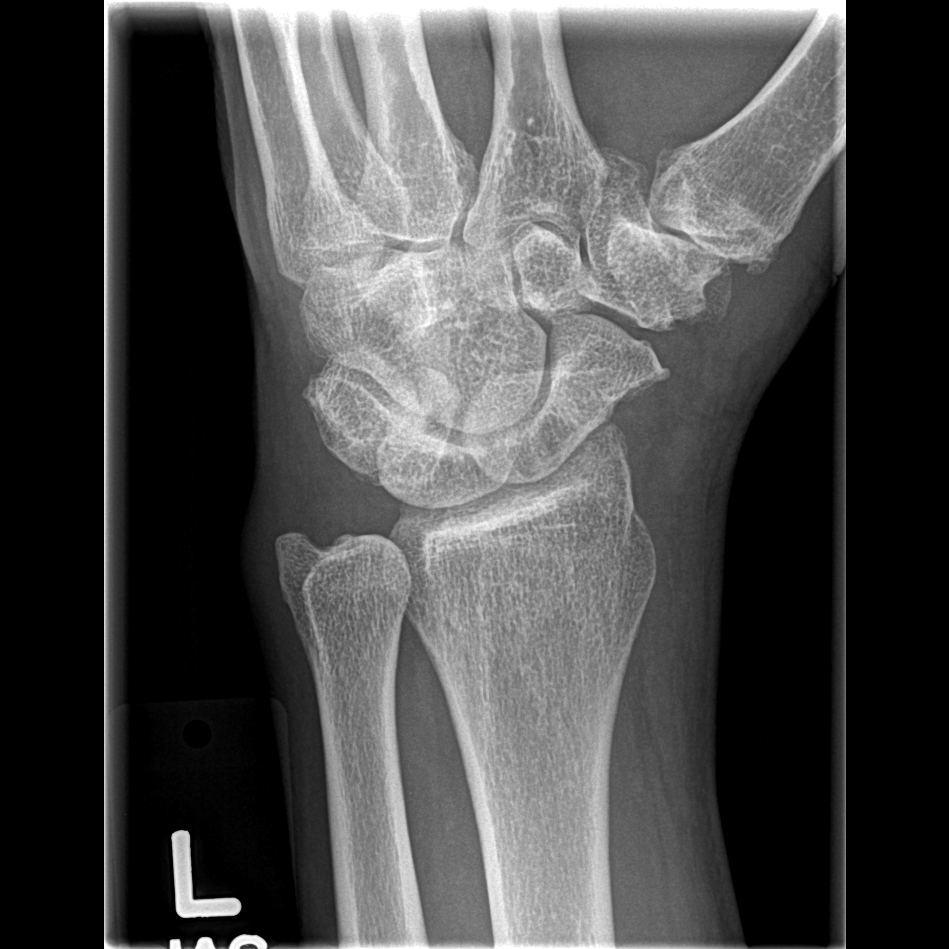

[x wrist lat left]
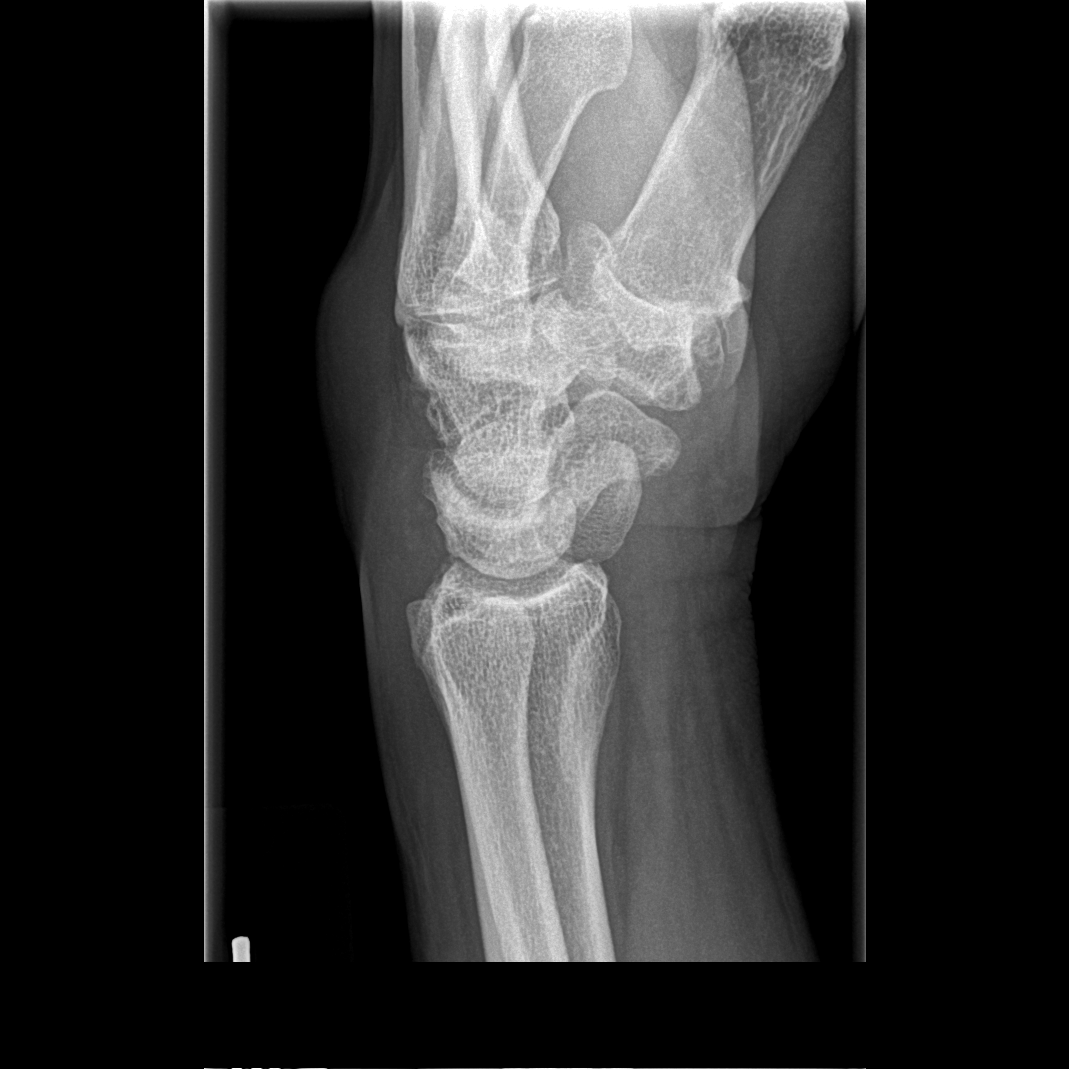

[x navicular]
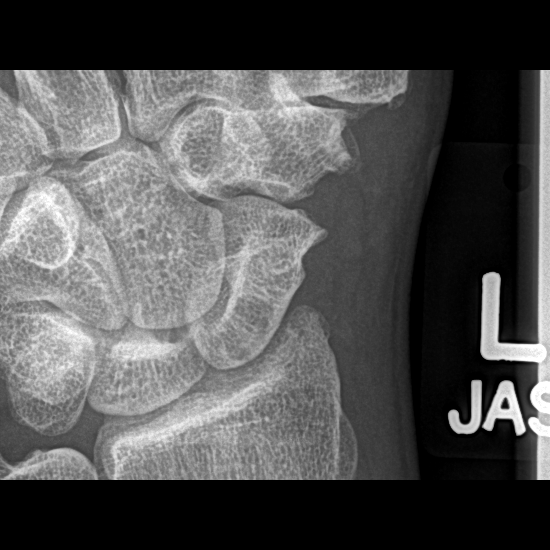

[4 of 4 positions shown; findings below may reference images not displayed]

FINDINGS: There is irregularity seen at the radial styloid.  There
is is otherwise normal without evidence of acute fracture or
malalignment.  Degenerative changes are seen at the first CMC
joint.  Soft tissue swelling is noted over the posterior hand.
IMPRESSION: Soft tissue swelling with questionable nondisplaced fracture of the
radial styloid.  Correlation with point tenderness in this area is
recommended.

## 2011-10-25 IMAGING — MR MR HEAD W/O CM
8 of 11 series · 27 of 48 positions shown · non-contrast
Comparison: CT 01/21/2011

MRI HEAD

CLINICAL DATA: Vertigo, fall

MRI HEAD WITHOUT CONTRAST
MRA HEAD WITHOUT CONTRAST
TECHNIQUE: Multiplanar, multiecho pulse sequences of the brain and
surrounding structures were obtained without intravenous contrast.
Angiographic images of the head were obtained using MRA technique
without contrast.

[Series 1: loc · axial · 10.0mm · 0.94mm/px · z∈[-30,+120]mm · 2 of 17 slices shown]
[im 1/17]
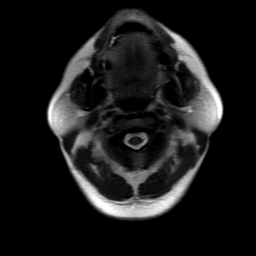
[im 17/17]
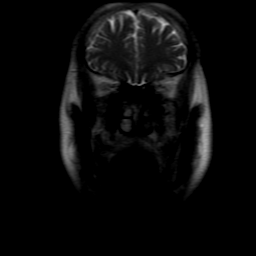

[Series 3: DWI · axial · 5.0mm · 1.09mm/px · z∈[-6,+131]mm · 6 of 52 slices shown (1 of 2)]
[im 1/52]
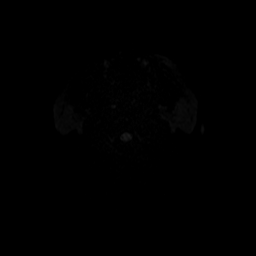
[im 11/52]
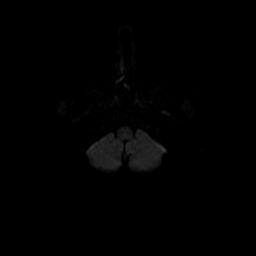
[im 21/52]
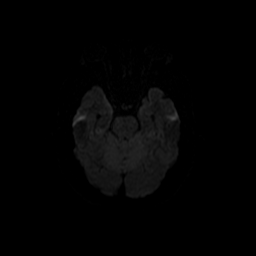
[im 31/52]
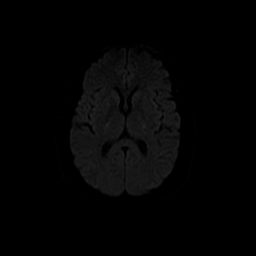
[im 41/52]
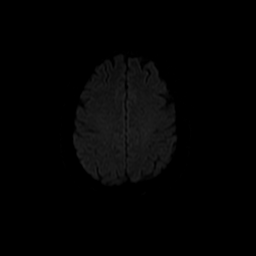
[im 52/52]
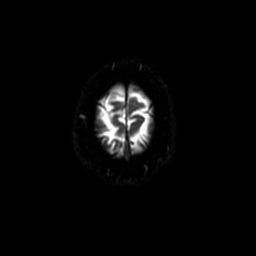

[Series 4: T2 · axial · 5.0mm · 0.43mm/px · z∈[-7,+126]mm · 2 of 20 slices shown (1 of 2)]
[im 1/20]
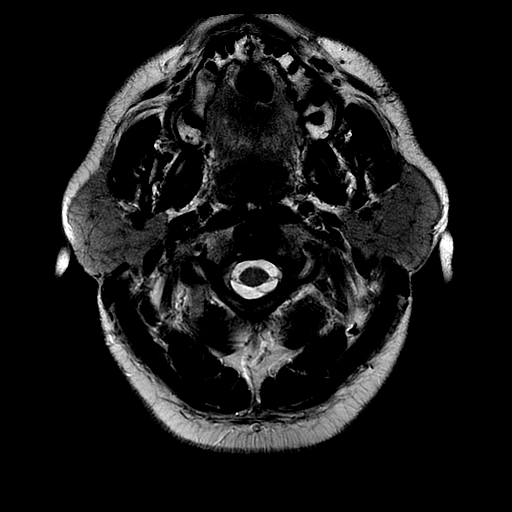
[im 20/20]
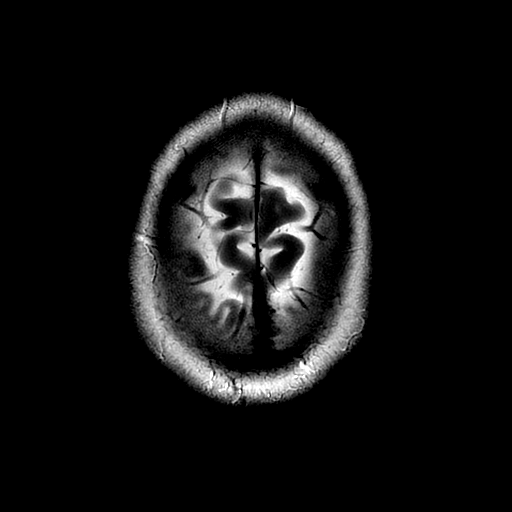

[Series 5: (id) mt fs · axial · 1.4mm · 0.43mm/px · z∈[+1,+95]mm · 8 of 136 slices shown]
[im 1/136]
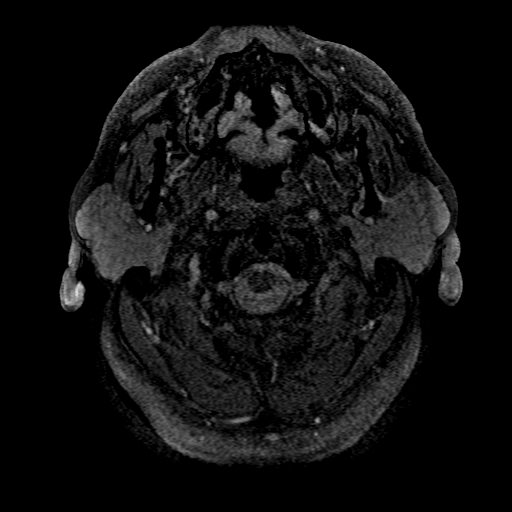
[im 21/136]
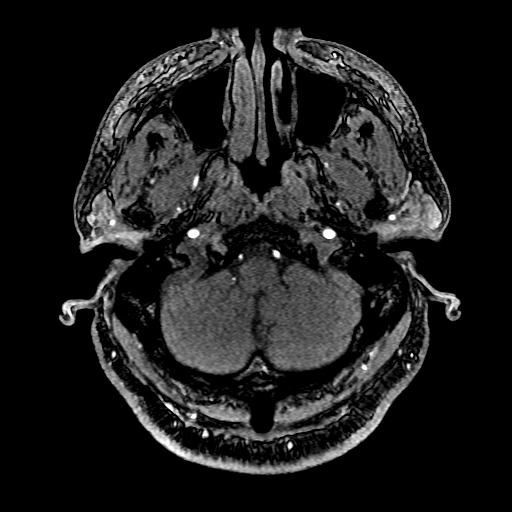
[im 42/136]
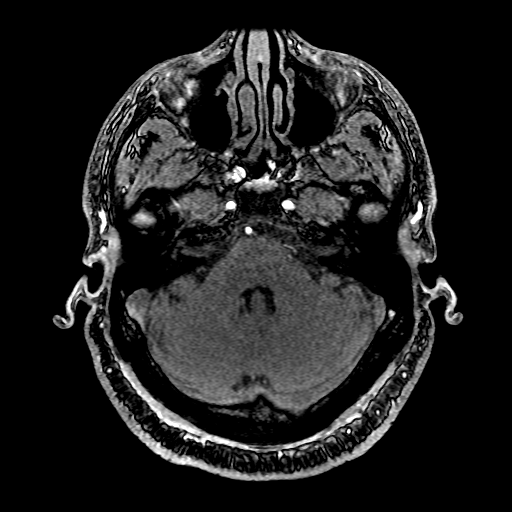
[im 63/136]
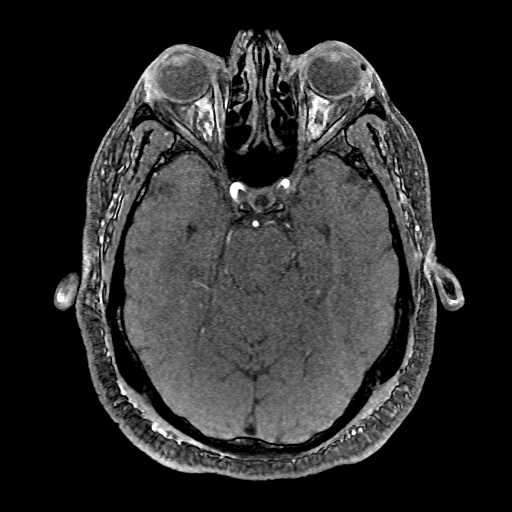
[im 73/136]
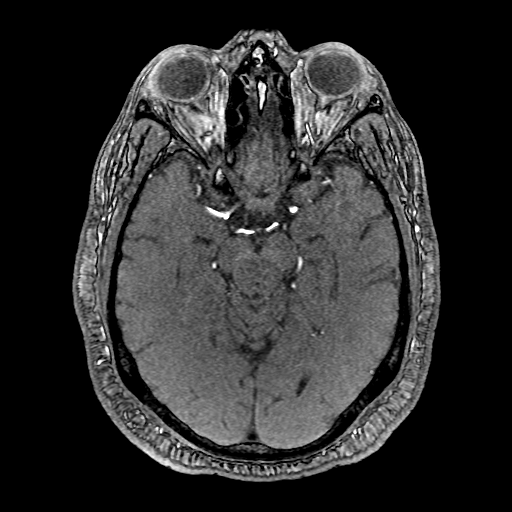
[im 94/136]
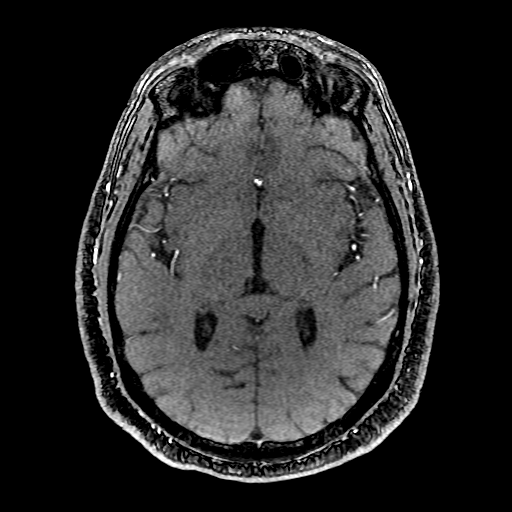
[im 115/136]
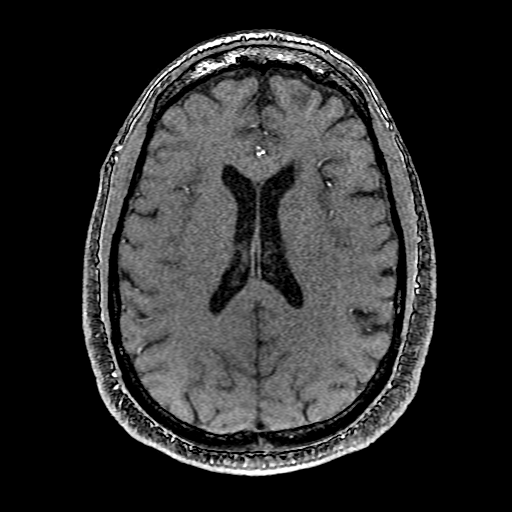
[im 136/136]
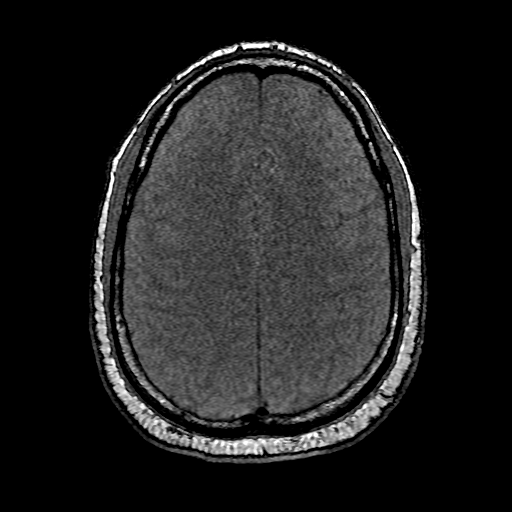

[Series 6: T1 · sagittal · 5.0mm · 0.43mm/px · 1 of 12 slices shown]
[im 1/12]
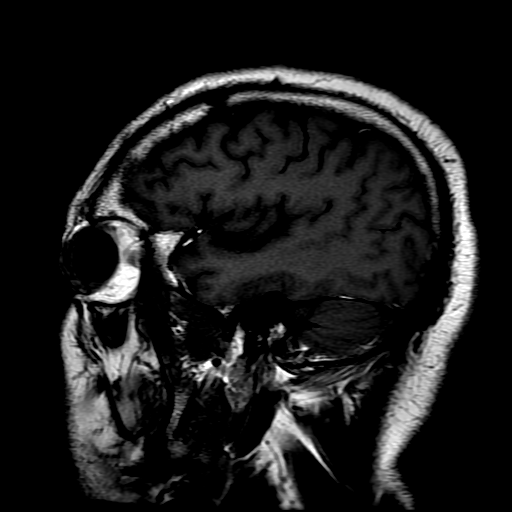

[Series 7: FLAIR · axial · 5.0mm · 0.43mm/px · z∈[-7,+126]mm · 2 of 20 slices shown]
[im 1/20]
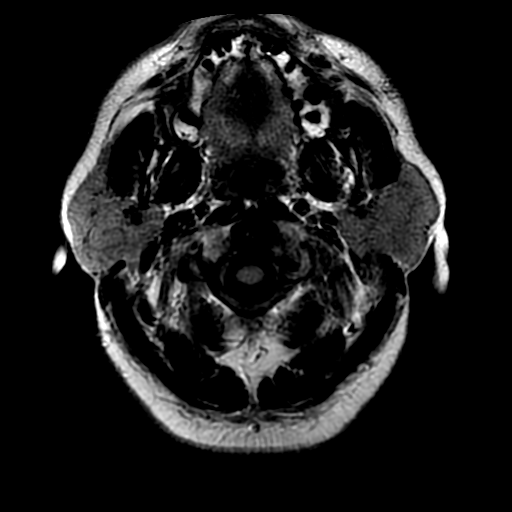
[im 20/20]
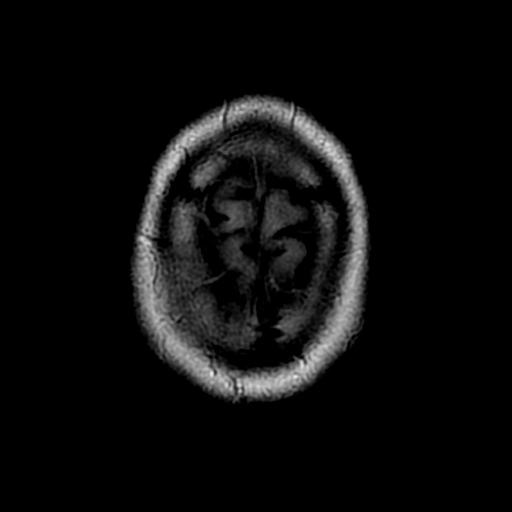

[Series 10: T2 · coronal · 5.0mm · 0.39mm/px · 3 of 26 slices shown (2 of 2)]
[im 1/26]
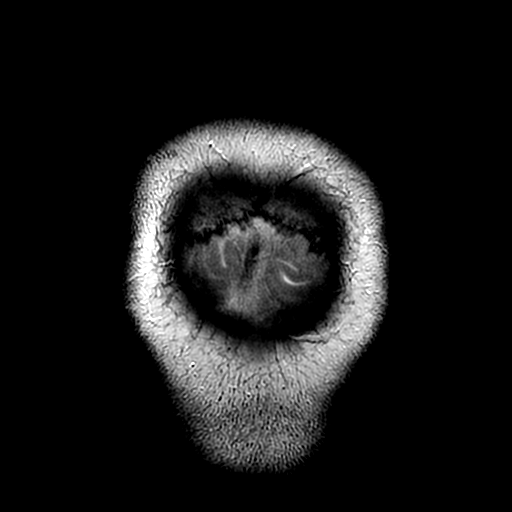
[im 13/26]
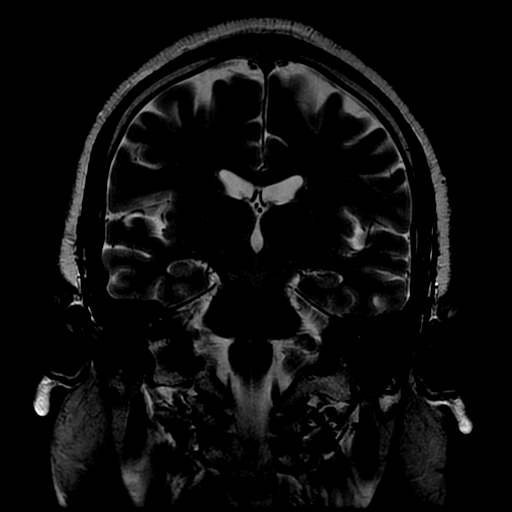
[im 26/26]
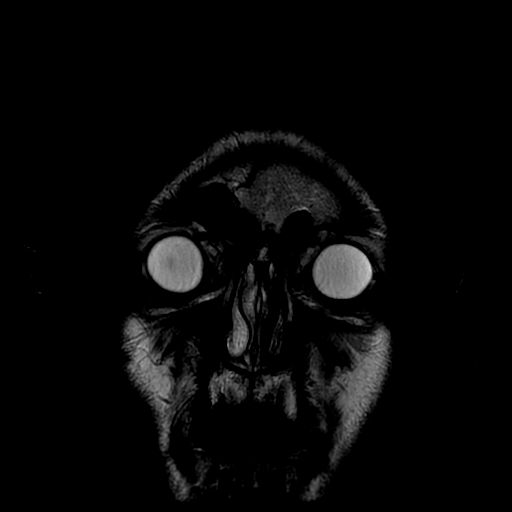

[Series 300: DWI · axial · 5.0mm · 1.09mm/px · z∈[-6,+131]mm · 3 of 26 slices shown (2 of 2)]
[im 1/26]
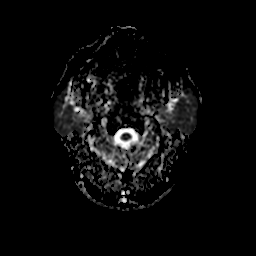
[im 13/26]
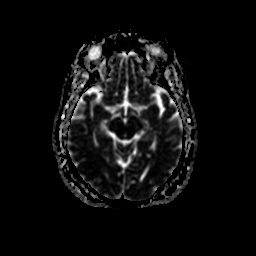
[im 26/26]
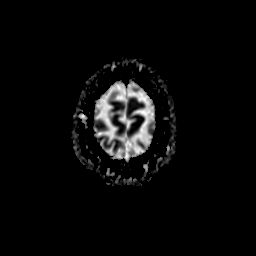

[27 of 48 positions shown; findings below may reference images not displayed]

FINDINGS: Negative for acute or chronic infarction. 3 mm
hyperintensity left occipital parietal white matter.  The remainder
of the white matter is normal.  Brainstem is normal.  Negative for
hemorrhage or mass lesion.  Ventricle size is normal.

Paranasal sinuses are clear.
IMPRESSION: Solitary tiny hyperintensity in the left occipital parietal white
matter of doubtful clinical significance.  Otherwise negative

MRA HEAD
FINDINGS: Both vertebral arteries are patent to the basilar.  PICA
is patent bilaterally.  Left AICA is patent.  Bilateral superior
cerebellar and posterior cerebral arteries patent bilaterally.

Internal carotid artery is widely patent bilaterally.  Anterior and
middle cerebral arteries are patent bilaterally.
IMPRESSION: Negative

## 2011-10-25 IMAGING — CT CT HEAD W/O CM
1 of 2 series · 13 of 30 positions shown, 17 images · non-contrast
Comparison: 03/24/2007

CLINICAL DATA: Fall

CT HEAD WITHOUT CONTRAST
TECHNIQUE: Contiguous axial images were obtained from the base of
the skull through the vertex without contrast.

[Series 2: brain · axial · 0.47mm/px · z∈[+87,+218]mm · 13 of 32 slices shown, 17 images]
[im 3/32  brain]
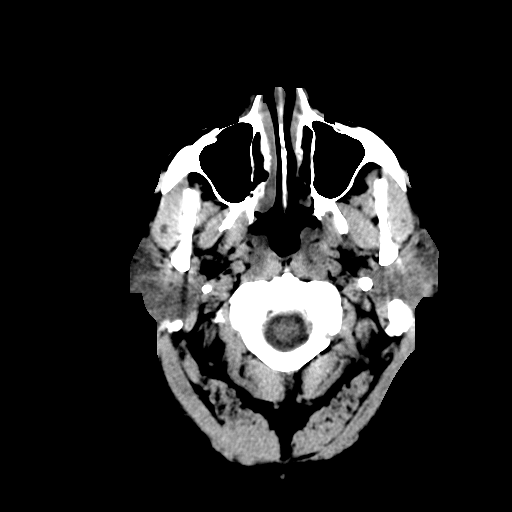
[im 3/32  bone]
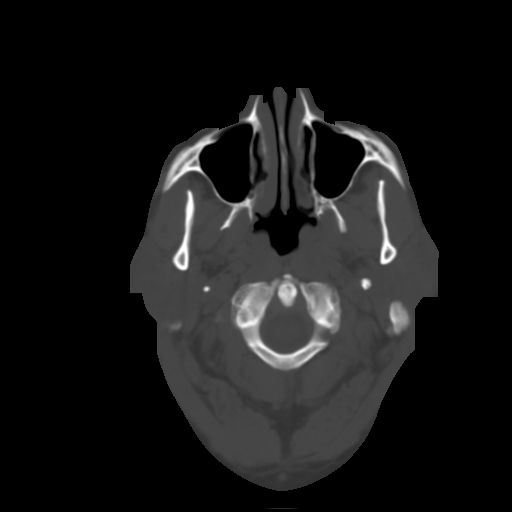
[im 5/32  brain]
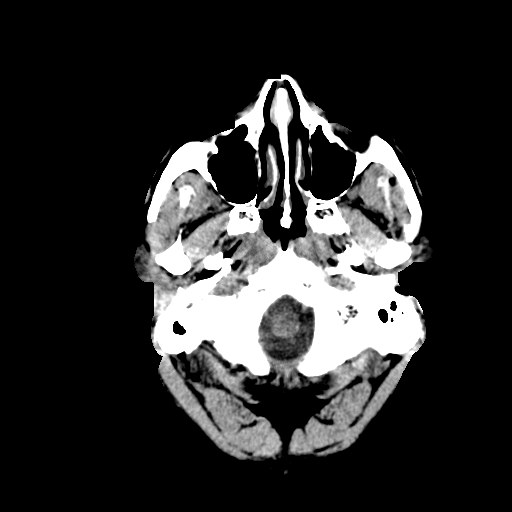
[im 7/32  brain]
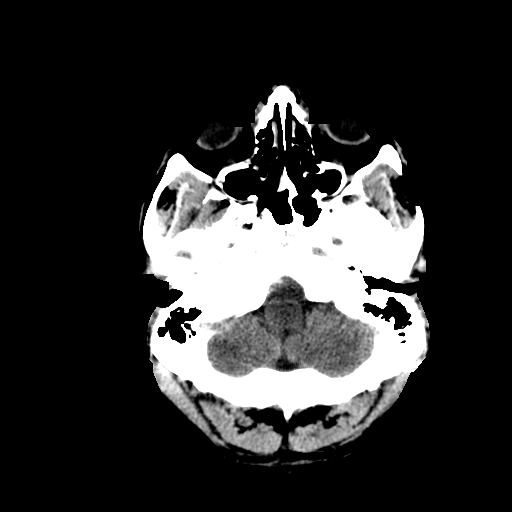
[im 9/32  brain]
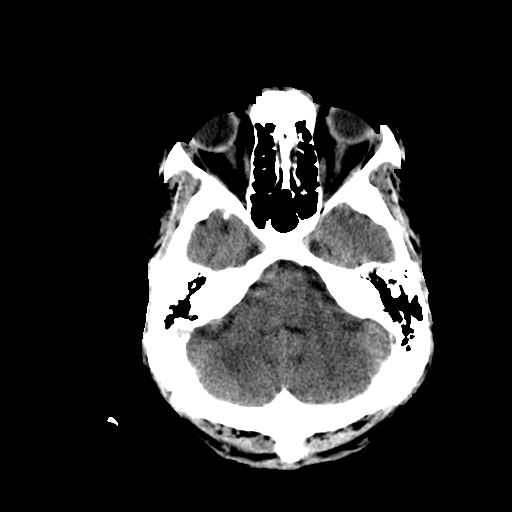
[im 12/32  brain]
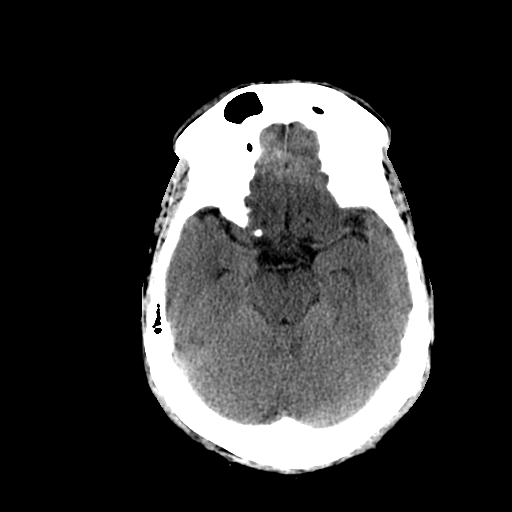
[im 12/32  bone]
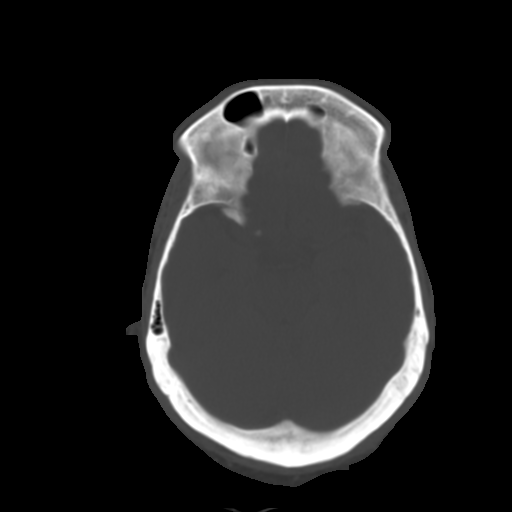
[im 14/32  brain]
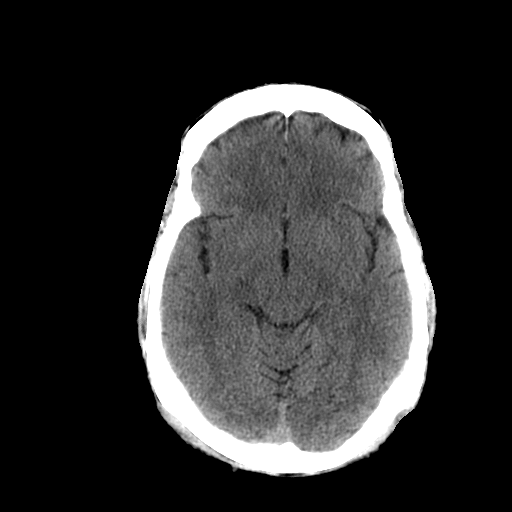
[im 16/32  brain]
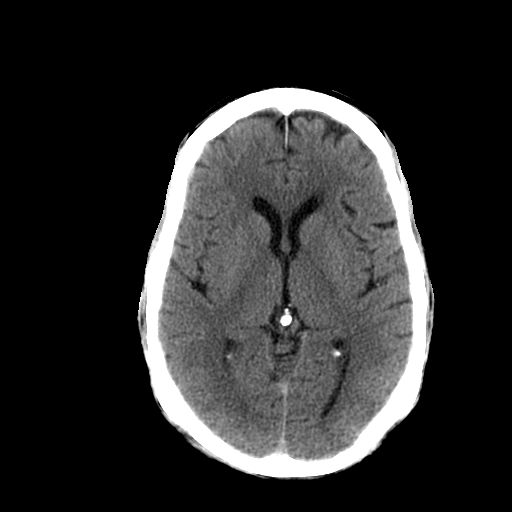
[im 18/32  brain]
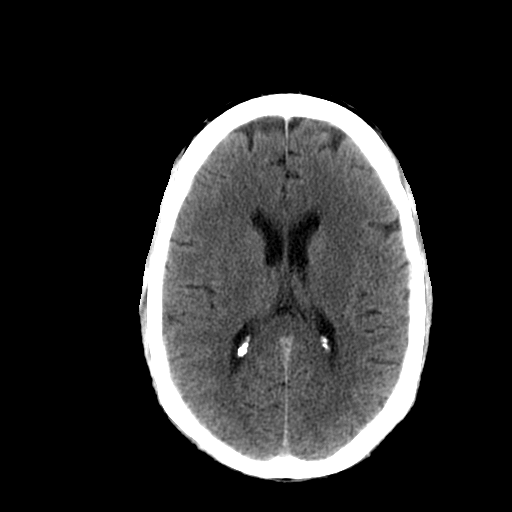
[im 20/32  brain]
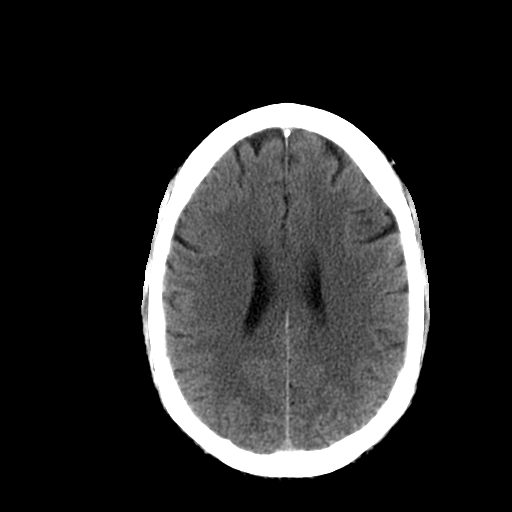
[im 20/32  bone]
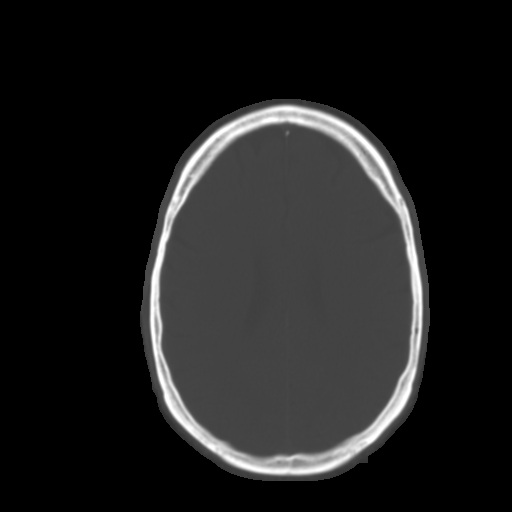
[im 23/32  brain]
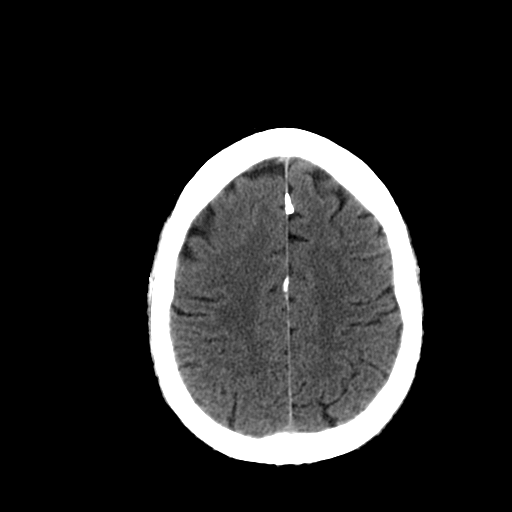
[im 25/32  brain]
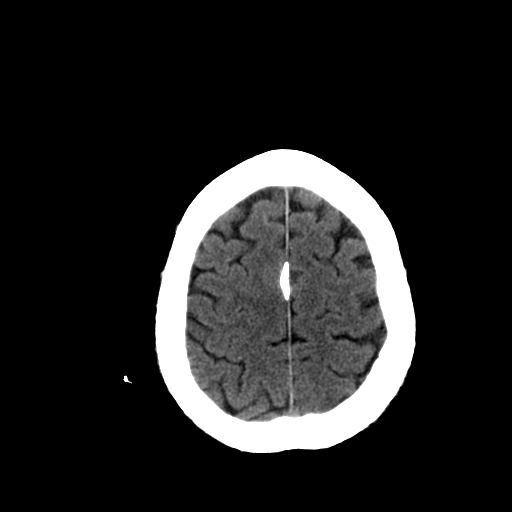
[im 27/32  brain]
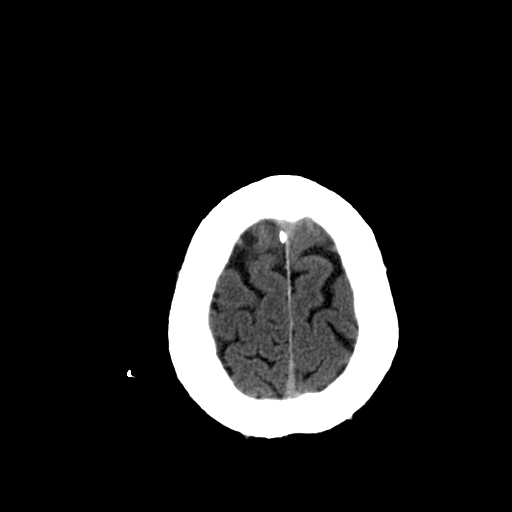
[im 29/32  brain]
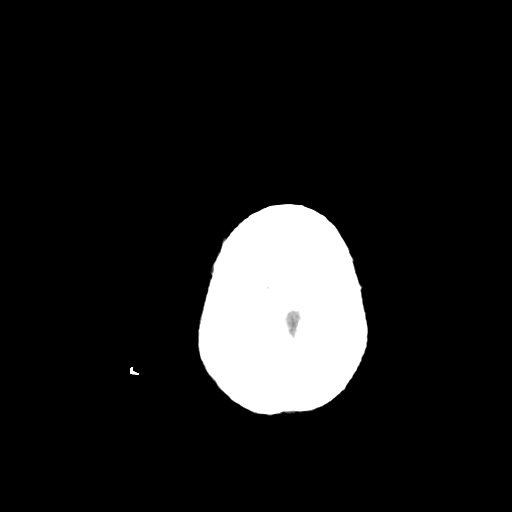
[im 29/32  bone]
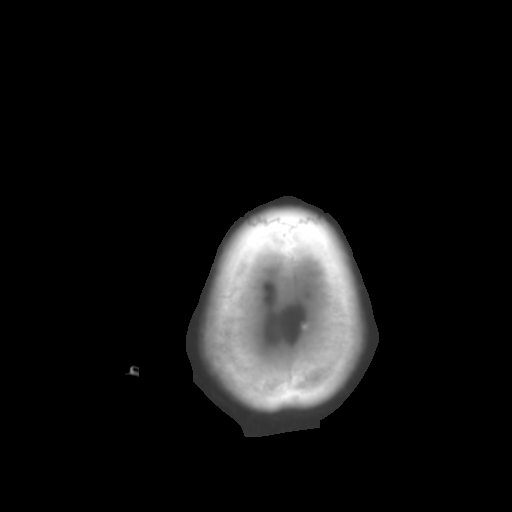

[13 of 30 positions shown; findings below may reference images not displayed]

FINDINGS: There are no acute intracranial abnormalities.
Specifically, there is no hemorrhage, infarct, mass lesion, midline
shift, hydrocephalus or extraaxial fluid collections.  The
calvarium is intact.  The paranasal sinuses are clear.
IMPRESSION: Normal head CT.

## 2011-10-25 IMAGING — CR DG SHOULDER 2+V*L*
3 series · 3 of 3 positions shown · non-contrast
Comparison: 05/25/2009

CLINICAL DATA: Fall

LEFT SHOULDER - 2+ VIEW

[t shoulder ap internal left]
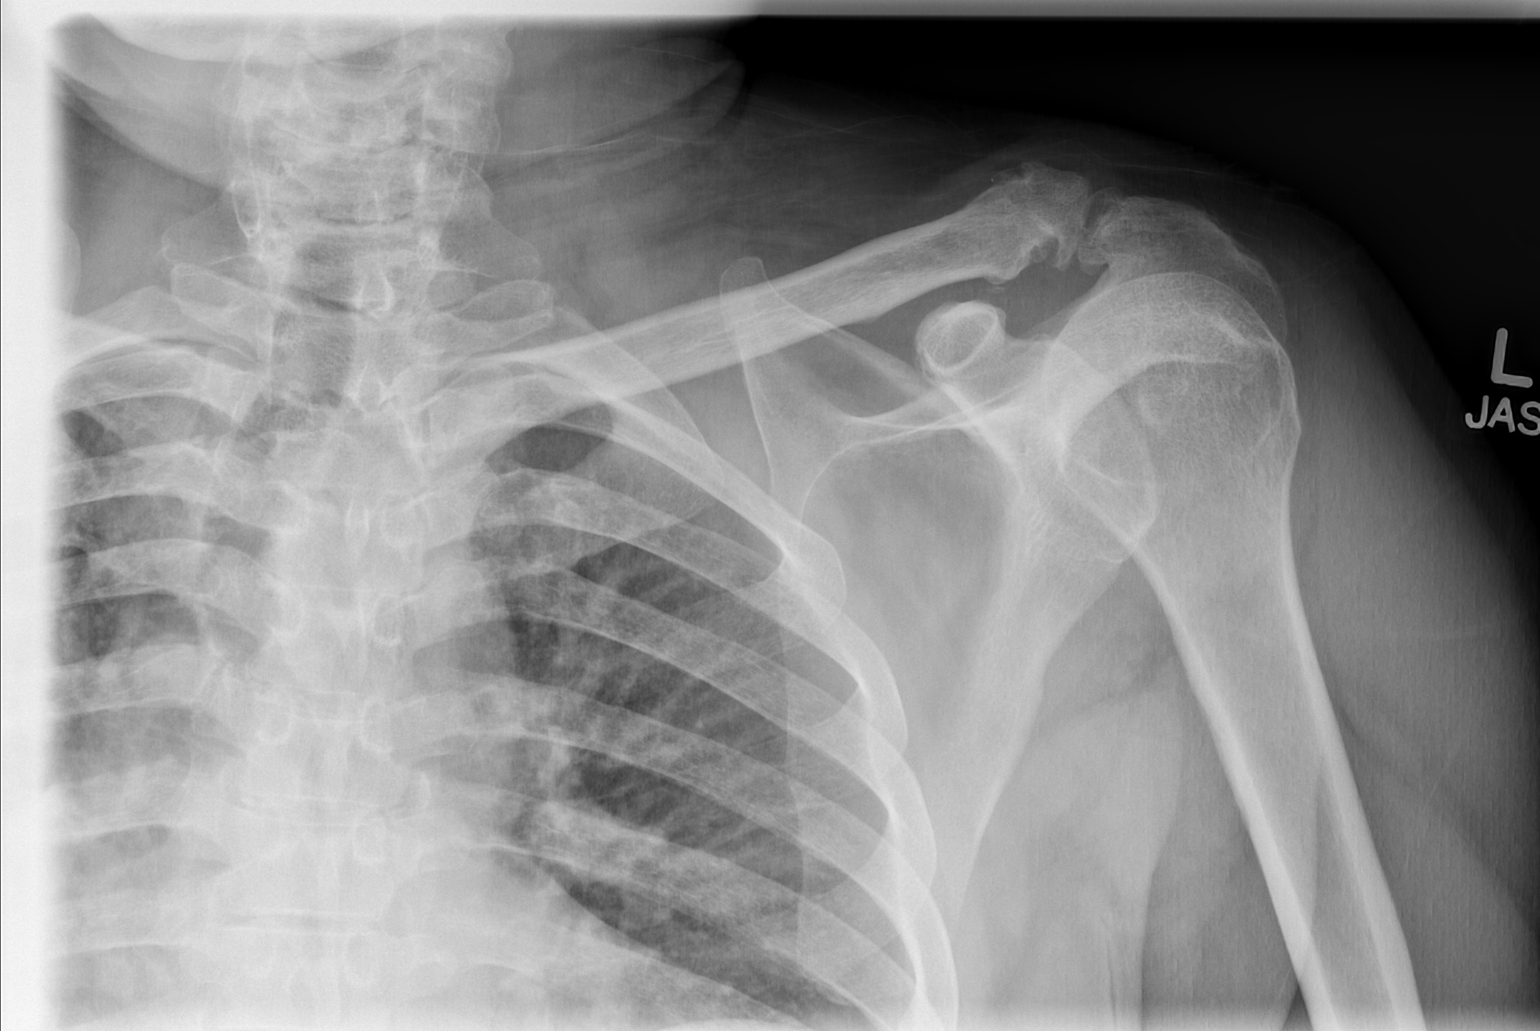

[t shoulder ap external left]
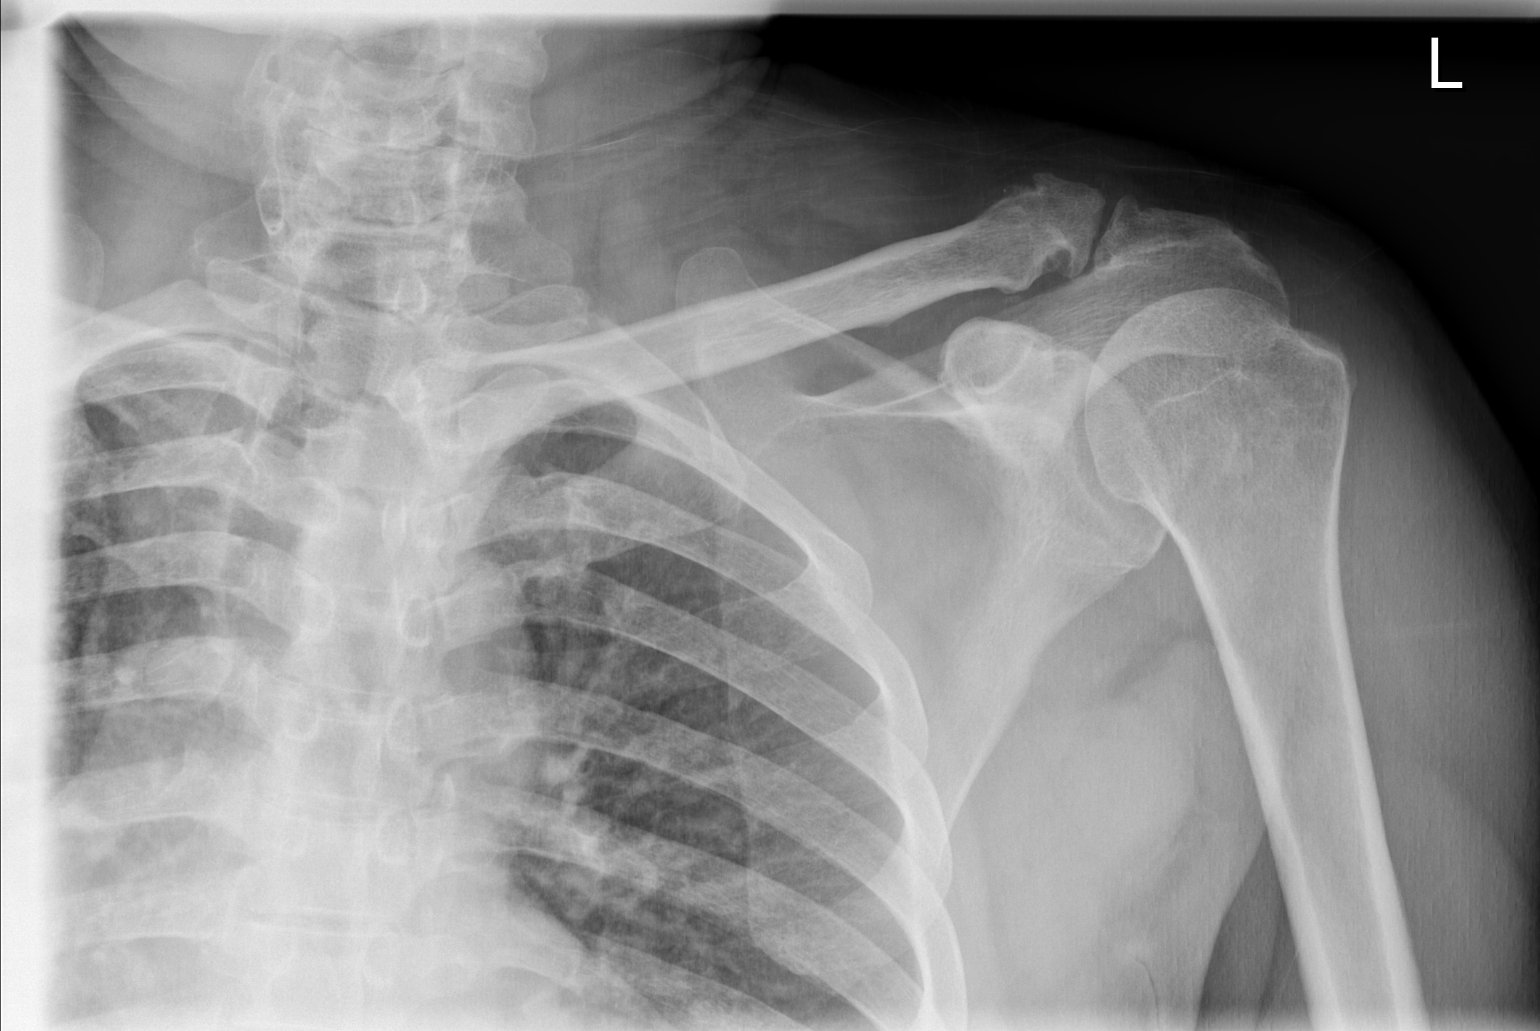

[t shoulder y view left *]
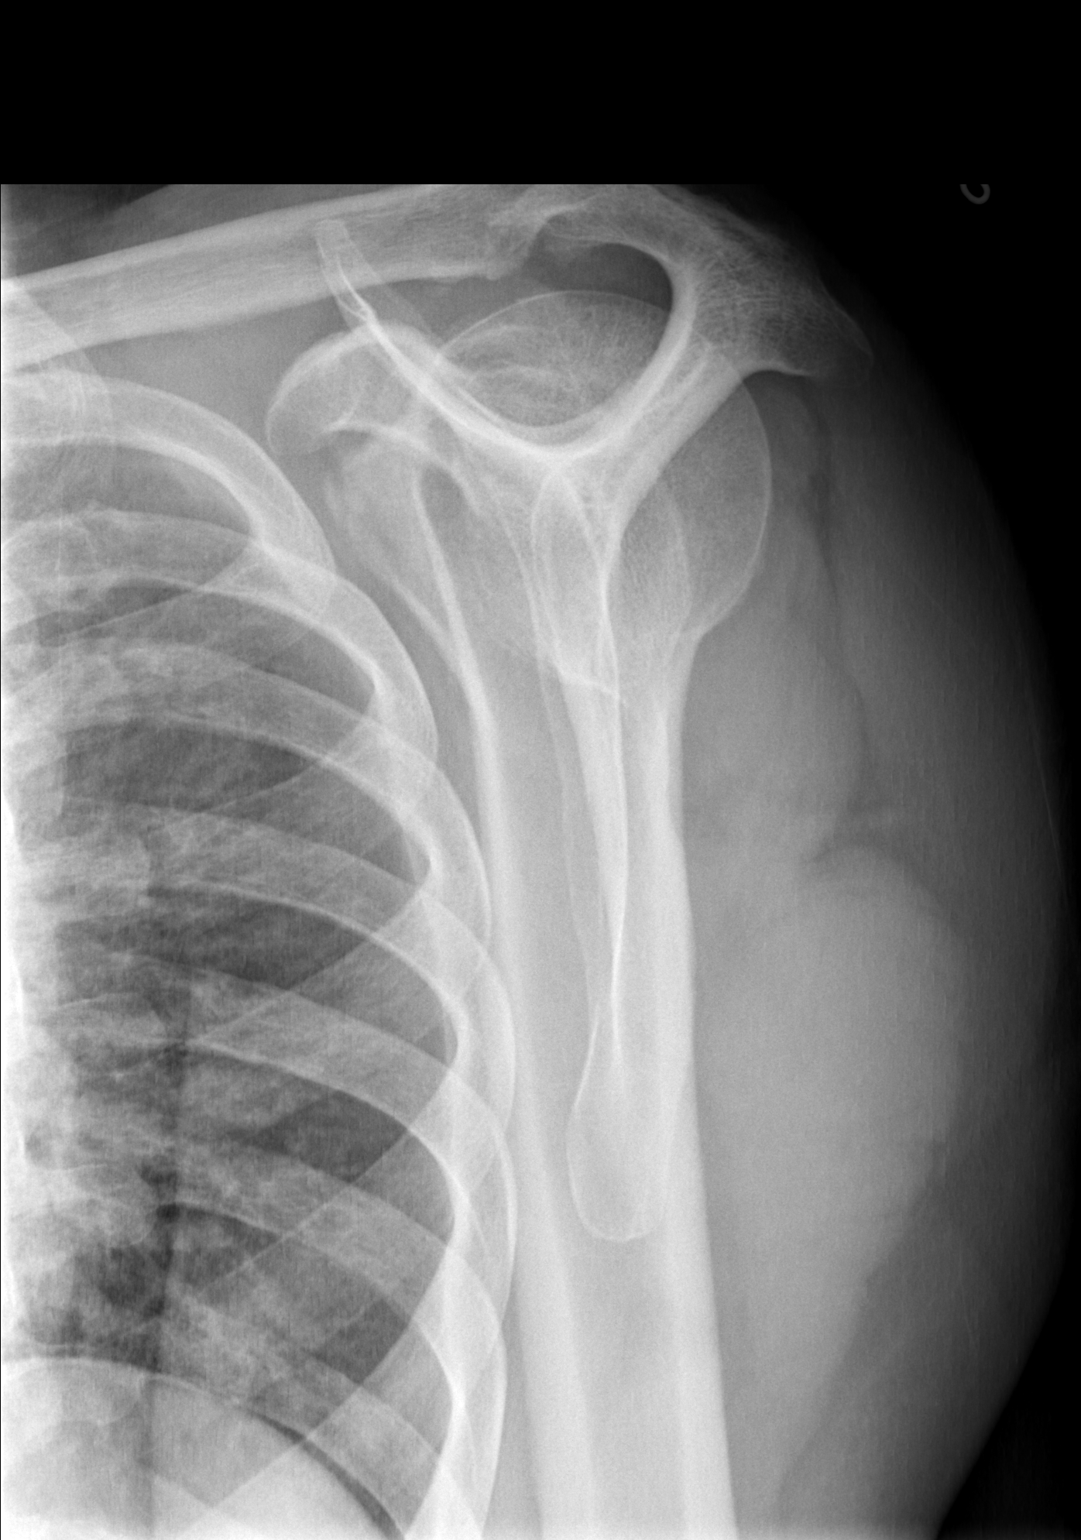

[3 of 3 positions shown; findings below may reference images not displayed]

FINDINGS: Severe degenerative changes are again seen at the AC
joint with a healed distal clavicular fracture again noted.  The
humeral head remains located without evidence of acute fracture or
malalignment.  Adjacent bony structures and soft tissues are
normal.
IMPRESSION: Severe AC joint osteoarthritis without acute findings.

## 2011-12-04 ENCOUNTER — Encounter (HOSPITAL_COMMUNITY): Payer: Self-pay

## 2011-12-04 ENCOUNTER — Emergency Department (HOSPITAL_COMMUNITY)
Admission: EM | Admit: 2011-12-04 | Discharge: 2011-12-04 | Disposition: A | Discharge status: Home / Self Care | Payer: Self-pay | Attending: Emergency Medicine | Admitting: Emergency Medicine

## 2011-12-04 ENCOUNTER — Emergency Department (HOSPITAL_COMMUNITY): Payer: Self-pay

## 2011-12-04 DIAGNOSIS — M25569 Pain in unspecified knee: Secondary | ICD-10-CM | POA: Insufficient documentation

## 2011-12-04 DIAGNOSIS — E7800 Pure hypercholesterolemia, unspecified: Secondary | ICD-10-CM | POA: Insufficient documentation

## 2011-12-04 DIAGNOSIS — M7989 Other specified soft tissue disorders: Secondary | ICD-10-CM | POA: Insufficient documentation

## 2011-12-04 DIAGNOSIS — I10 Essential (primary) hypertension: Secondary | ICD-10-CM | POA: Insufficient documentation

## 2011-12-04 DIAGNOSIS — I252 Old myocardial infarction: Secondary | ICD-10-CM | POA: Insufficient documentation

## 2011-12-04 DIAGNOSIS — M109 Gout, unspecified: Secondary | ICD-10-CM | POA: Insufficient documentation

## 2011-12-04 DIAGNOSIS — E119 Type 2 diabetes mellitus without complications: Secondary | ICD-10-CM | POA: Insufficient documentation

## 2011-12-04 DIAGNOSIS — E785 Hyperlipidemia, unspecified: Secondary | ICD-10-CM | POA: Insufficient documentation

## 2011-12-04 DIAGNOSIS — Z8679 Personal history of other diseases of the circulatory system: Secondary | ICD-10-CM | POA: Insufficient documentation

## 2011-12-04 MED ORDER — FAMOTIDINE 20 MG PO TABS: 20.0000 mg | ORAL_TABLET | Freq: Two times a day (BID) | ORAL | Status: DC

## 2011-12-04 MED ORDER — KETOROLAC TROMETHAMINE 60 MG/2ML IM SOLN
60.0000 mg | Freq: Once | INTRAMUSCULAR | Status: AC
  Administered 2011-12-04: 60 mg via INTRAMUSCULAR
  Filled 2011-12-04: qty 2

## 2011-12-04 MED ORDER — INDOMETHACIN 25 MG PO CAPS: ORAL_CAPSULE | ORAL | Status: DC

## 2011-12-04 MED ORDER — OXYCODONE-ACETAMINOPHEN 5-325 MG PO TABS
1.0000 | ORAL_TABLET | Freq: Once | ORAL | Status: AC
  Administered 2011-12-04: 1 via ORAL
  Filled 2011-12-04: qty 1

## 2011-12-04 NOTE — ED Notes (Signed)
Pt complains of left knee pain, no injury noted, woke this am, swelling, and painful.

## 2011-12-04 NOTE — Progress Notes (Signed)
Orthopedic Tech Progress Note Patient Details:  Russell Carter 1956/05/10 [id number]  Other Ortho Devices Type of Ortho Device: Knee Sleeve;Crutches Ortho Device Location: left LE Ortho Device Interventions: Application   Gaye Pollack 12/04/2011, 12:51 PM

## 2011-12-04 NOTE — Discharge Instructions (Signed)
Your xrays did not show any acute problems. This is most likely a flare of your gout. We will put you on a taper dose of your indomethacin to help with the acute flare. Make sure you take the medication with food as well as the anti-acid medication. Keep the knee elevated and iced. Return to the ER with worsening pain, swelling, high fever, or other worrisome symptoms.  RESOURCE GUIDE  Dental Problems  Patients with Medicaid: [facility] [phone] [address]                                           [phone] [address] Phone:  [phone]                                                  Phone:  [phone]  If unable to pay or uninsured, contact:  [facility] or [facility]. to become qualified for the adult dental clinic.  Chronic Pain Problems Contact [facility]  [phone] Patients need to be referred by their primary care doctor.  Insufficient Money for Medicine Contact [employer]:  call "211" or [facility] 442 526 7378.  No Primary Care Doctor Call [facility]  520-650-4176 Other agencies that provide inexpensive medical care    [facility]  [phone]    [facility]  [phone]    [facility]  [phone]    [facility]  [phone]    [facility]  [phone]    [facility]  [phone]  Psychological Services [facility]  [phone] [facility]  [phone] [facility]   [phone] (emergency services [phone])  Substance Abuse Resources [facility]  ([phone] [facility] (947)582-0896 [facility] 5038641918 Floydene Flock (604)626-2021 Residential & Outpatient Substance Abuse Program  609-277-7239  Abuse/Neglect [facility] Child Abuse Hotline 430-264-9490 [facility] Child Abuse Hotline (607)188-1083 (After Hours)  Emergency Shelter [facility]  [place] [phone]  M[facility] Homes [facility] [place] [facility] [phone] [place] Services [phone]  MRSA Hotline #:   346-852-3938    [facility]  [facility]     [employer]                          [facility] [address] [place]                       [address]      [address]  Patrecia Pace  Michell Heinrich Phone:  [phone]                                   Phone:  [phone]                 Phone:  [phone]  [facility] Mental [facility] Phone:  [phone]  [facility] Child Abuse Hotline 647-666-1740 724-677-5691 (After Hours)  Gout Gout is an inflammatory condition (arthritis) caused by a buildup of uric acid crystals in the joints. Uric acid is a chemical that is normally present in the blood. Under some circumstances, uric acid can form into crystals in your joints. This causes joint redness, soreness, and swelling (inflammation). Repeat attacks are common. Over time, uric acid crystals can form into masses (tophi) near a joint, causing disfigurement. Gout is treatable and often preventable. CAUSES  The disease begins with elevated levels of uric acid in the blood. Uric acid is produced by your body when it breaks down a naturally found substance called purines. This also happens when you eat certain foods such as meats and fish. Causes of an elevated uric acid level include:  Being passed down from parent to child (heredity).   Diseases that cause increased uric acid production (obesity, psoriasis, some cancers).   Excessive alcohol use.   Diet, especially diets rich in meat and seafood.   Medicines, including certain cancer-fighting drugs (chemotherapy), diuretics, and aspirin.   Chronic kidney disease. The kidneys are no longer able to remove uric acid well.   Problems with  metabolism.  Conditions strongly associated with gout include:  Obesity.   High blood pressure.   High cholesterol.   Diabetes.  Not everyone with elevated uric acid levels gets gout. It is not understood why some people get gout and others do not. Surgery, joint injury, and eating too much of certain foods are some of the factors that can lead to gout. SYMPTOMS   An attack of gout comes on quickly. It causes intense pain with redness, swelling, and warmth in a joint.   Fever can occur.   Often, only one joint is involved. Certain joints are more commonly involved:   Base of the big toe.   Knee.   Ankle.   Wrist.   Finger.  Without treatment, an attack usually goes away in a few days to weeks. Between attacks, you usually will not have symptoms, which is different from many other forms of arthritis. DIAGNOSIS  Your caregiver will suspect gout based on your symptoms and exam. Removal of fluid from the joint (arthrocentesis) is done to check for uric acid crystals. Your caregiver will give you a medicine that numbs the area (local anesthetic) and use a needle to remove joint fluid for exam. Gout is confirmed when uric acid crystals are seen in joint fluid, using a special microscope. Sometimes, blood, urine, and X-ray tests are also used. TREATMENT  There are 2 phases to gout treatment: treating the sudden onset (acute) attack and preventing attacks (prophylaxis). Treatment of an Acute Attack  Medicines are used. These include anti-inflammatory medicines or steroid medicines.   An injection of steroid medicine into the affected joint is sometimes necessary.   The painful joint is rested. Movement can worsen the arthritis.   You may use warm or cold treatments on painful joints, depending which works best for you.   Discuss the use of coffee, vitamin C, or cherries with  your caregiver. These may be helpful treatment options.  Treatment to Prevent Attacks After the acute  attack subsides, your caregiver may advise prophylactic medicine. These medicines either help your kidneys eliminate uric acid from your body or decrease your uric acid production. You may need to stay on these medicines for a very long time. The early phase of treatment with prophylactic medicine can be associated with an increase in acute gout attacks. For this reason, during the first few months of treatment, your caregiver may also advise you to take medicines usually used for acute gout treatment. Be sure you understand your caregiver's directions. You should also discuss dietary treatment with your caregiver. Certain foods such as meats and fish can increase uric acid levels. Other foods such as dairy can decrease levels. Your caregiver can give you a list of foods to avoid. HOME CARE INSTRUCTIONS   Do not take aspirin to relieve pain. This raises uric acid levels.   Only take over-the-counter or prescription medicines for pain, discomfort, or fever as directed by your caregiver.   Rest the joint as much as possible. When in bed, keep sheets and blankets off painful areas.   Keep the affected joint raised (elevated).   Use crutches if the painful joint is in your leg.   Drink enough water and fluids to keep your urine clear or pale yellow. This helps your body get rid of uric acid. Do not drink alcoholic beverages. They slow the passage of uric acid.   Follow your caregiver's dietary instructions. Pay careful attention to the amount of protein you eat. Your daily diet should emphasize fruits, vegetables, whole grains, and fat-free or low-fat milk products.   Maintain a healthy body weight.  SEEK MEDICAL CARE IF:   You have an oral temperature above 102 F (38.9 C).   You develop diarrhea, vomiting, or any side effects from medicines.   You do not feel better in 24 hours, or you are getting worse.  SEEK IMMEDIATE MEDICAL CARE IF:   Your joint becomes suddenly more tender and you  have:   Chills.   An oral temperature above 102 F (38.9 C), not controlled by medicine.  MAKE SURE YOU:   Understand these instructions.   Will watch your condition.   Will get help right away if you are not doing well or get worse.  Document Released: 10/05/2000 Document Revised: 06/20/2011 Document Reviewed: 01/16/2010 [facility] Patient Information 2012 [place], [place].

## 2011-12-04 NOTE — ED Notes (Signed)
Discharge instructions reviewed; verbalizes understanding.  No questions asked; no further c/o's voiced.  Pt ambulatory to lobby using crutches.

## 2011-12-04 NOTE — ED Provider Notes (Signed)
History     CSN: [id number]  Arrival date & time 12/04/11  1004   First MD Initiated Contact with Patient 12/04/11 1018      Chief Complaint  Patient presents with  . Knee Pain    (Consider location/radiation/quality/duration/timing/severity/associated sxs/prior treatment) The history is provided by the patient.   Patient presents with left knee pain which started this morning. He denies any antecedent injury. The pain is associated with swelling, but he has not noticed any redness or warmth to the area. He does have a past medical history of gout, and states that he's been out of his daily indomethacin for about the past week. He has never had a gout flare in his knee before. The pain is described as throbbing in nature. It is improved with full extension of the leg and worsens with bending the knee. Denies fever, chills.  Past Medical History  Diagnosis Date  . Hyperglycemia   . Gout, unspecified   . Heart block   . Hypercholesterolemia   . Chest pain   . Hyperlipidemia   . HTN (hypertension)   . Muscle spasm   . Diabetes mellitus   . Chest pain   . Abdominal pain   . Rt groin pain   . Blood in stool   . Migraines   . Stroke   . Arthritis   . Pneumonia   . Depression   . 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 Reapir of left inguinal hernia 02/02/2003    Dr. Violeta Gelinas. repair with mesh. Same procedure done again in  05/22/2004   . Rotator cuff repair 2010    left     Family History  Problem Relation Age of Onset  . Heart attack Mother     x7. Still alive  . Heart attack Father     deceased b/c of MI  . Heart attack Brother     older, deceased b/c of MI     History  Substance Use Topics  . Smoking status: Never Smoker   . Smokeless tobacco: Never Used  . Alcohol Use: 4.2 oz/week    7 Cans of beer per week     occasional beer      Review of Systems    Constitutional: Negative for fever and chills.  Musculoskeletal: Positive for joint swelling, arthralgias and gait problem.  Skin: Negative for color change.  Neurological: Negative for weakness and numbness.    Allergies  Shellfish allergy  Home Medications   Current Outpatient Rx  Name Route Sig Dispense Refill  . ASPIRIN EC 325 MG PO TBEC Oral Take 325 mg by mouth daily.      . CYCLOBENZAPRINE HCL 10 MG PO TABS Oral Take 10 mg by mouth 3 (three) times daily as needed. For muscle spasms    . HYDROCHLOROTHIAZIDE 25 MG PO TABS Oral Take 25 mg by mouth daily.     . INDOMETHACIN 25 MG PO CAPS Oral Take 25 mg by mouth 3 (three) times daily with meals.     Marland Kitchen LISINOPRIL 40 MG PO TABS Oral Take 40 mg by mouth daily.    Marland Kitchen MECLIZINE HCL 25 MG PO TABS Oral Take 25 mg by mouth daily as needed. For dizziness    . NITROGLYCERIN 0.4 MG SL SUBL Sublingual Place 0.4 mg under the tongue every 5 (five) minutes as  needed. For chest pain    . PRAVASTATIN SODIUM 40 MG PO TABS Oral Take 40 mg by mouth daily.    Marland Kitchen TACRINE HYDROCHLORIDE PO Oral Take 25 mg by mouth daily.        BP 150/87  Pulse 86  Temp(Src) 98.5 F (36.9 C) (Oral)  Resp 16  SpO2 97%  Physical Exam  Nursing note and vitals reviewed. Constitutional: He appears well-developed and well-nourished. No distress.  HENT:  Head: Normocephalic and atraumatic.  Cardiovascular: Normal rate.   Pulmonary/Chest: Effort normal.  Musculoskeletal:       Left knee: He exhibits decreased range of motion and swelling. He exhibits no effusion, no deformity and no erythema.       Generalized exquisite tenderness to the left knee, worst along the lateral joint line. Patient only able to flex knee to about 150 due to pain. Knee does not appear erythematous, but is swollen. There is no definitive effusion, and the patella is not ballotable. Distal extremity neurovascularly intact with sensory intact to light touch. Good DP/PT pulses.  Neurological: He is  alert.  Skin: Skin is warm and dry. He is not diaphoretic.  Psychiatric: He has a normal mood and affect.    ED Course  Procedures (including critical care time)  Labs Reviewed - No data to display Dg Knee Complete 4 Views Left  12/04/2011  *RADIOLOGY REPORT*  Clinical Data: Knee pain and swelling.  LEFT KNEE - COMPLETE 4+ VIEW  Comparison: None.  Findings: There is no acute bony or joint abnormality.  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  No joint effusion.  There is some soft tissue swelling anterior to the patella.  IMPRESSION:  1.  Negative for fracture. 2.  Bipartite patella with patellofemoral degenerative change. 3.  Prepatellar soft tissue swelling suggestive of bursitis.  Original Report Authenticated By: Bernadene Bell. Maricela Curet, M.D.   I personally reviewed the patient's x-rays.   1. Gout, unspecified       MDM  Patient with known history of gout presents with painful and swollen left knee without known mechanism of injury. He has been out of his indomethacin for about the past week. He is afebrile and nontoxic appearing. His x-ray shows prepatellar soft tissue swelling and a bipartite patella, but is otherwise unremarkable. Feel that this is likely a flare of his gout. Will treat with indomethacin taper. Advised the patient to take with food. Advised him to use crutches for the next several days and to use RICE precautions. Return precautions discussed.        Grant Fontana, [place] 12/04/11 1250

## 2011-12-05 NOTE — ED Provider Notes (Signed)
Medical screening examination/treatment/procedure(s) were performed by non-physician practitioner and as supervising physician I was immediately available for consultation/collaboration.    Nelia Shi, MD 12/05/11 1043

## 2011-12-17 ENCOUNTER — Emergency Department (HOSPITAL_COMMUNITY): Payer: Self-pay

## 2011-12-17 ENCOUNTER — Other Ambulatory Visit: Payer: Self-pay

## 2011-12-17 ENCOUNTER — Encounter (HOSPITAL_COMMUNITY): Payer: Self-pay | Admitting: *Deleted

## 2011-12-17 ENCOUNTER — Emergency Department (HOSPITAL_COMMUNITY)
Admission: EM | Admit: 2011-12-17 | Discharge: 2011-12-17 | Disposition: A | Discharge status: Home / Self Care | Payer: Self-pay | Attending: Emergency Medicine | Admitting: Emergency Medicine

## 2011-12-17 DIAGNOSIS — E7800 Pure hypercholesterolemia, unspecified: Secondary | ICD-10-CM | POA: Insufficient documentation

## 2011-12-17 DIAGNOSIS — Z8639 Personal history of other endocrine, nutritional and metabolic disease: Secondary | ICD-10-CM | POA: Insufficient documentation

## 2011-12-17 DIAGNOSIS — I10 Essential (primary) hypertension: Secondary | ICD-10-CM | POA: Insufficient documentation

## 2011-12-17 DIAGNOSIS — Z862 Personal history of diseases of the blood and blood-forming organs and certain disorders involving the immune mechanism: Secondary | ICD-10-CM | POA: Insufficient documentation

## 2011-12-17 DIAGNOSIS — F329 Major depressive disorder, single episode, unspecified: Secondary | ICD-10-CM | POA: Insufficient documentation

## 2011-12-17 DIAGNOSIS — Z79899 Other long term (current) drug therapy: Secondary | ICD-10-CM | POA: Insufficient documentation

## 2011-12-17 DIAGNOSIS — E785 Hyperlipidemia, unspecified: Secondary | ICD-10-CM | POA: Insufficient documentation

## 2011-12-17 DIAGNOSIS — F3289 Other specified depressive episodes: Secondary | ICD-10-CM | POA: Insufficient documentation

## 2011-12-17 DIAGNOSIS — R519 Headache, unspecified: Secondary | ICD-10-CM

## 2011-12-17 DIAGNOSIS — I252 Old myocardial infarction: Secondary | ICD-10-CM | POA: Insufficient documentation

## 2011-12-17 DIAGNOSIS — I160 Hypertensive urgency: Secondary | ICD-10-CM

## 2011-12-17 DIAGNOSIS — M129 Arthropathy, unspecified: Secondary | ICD-10-CM | POA: Insufficient documentation

## 2011-12-17 DIAGNOSIS — Z8673 Personal history of transient ischemic attack (TIA), and cerebral infarction without residual deficits: Secondary | ICD-10-CM | POA: Insufficient documentation

## 2011-12-17 DIAGNOSIS — R51 Headache: Secondary | ICD-10-CM | POA: Insufficient documentation

## 2011-12-17 DIAGNOSIS — E119 Type 2 diabetes mellitus without complications: Secondary | ICD-10-CM | POA: Insufficient documentation

## 2011-12-17 LAB — DIFFERENTIAL
Eosinophils Absolute: 0 10*3/uL (ref 0.0–0.7)
Eosinophils Relative: 1 % (ref 0–5)
Lymphs Abs: 1.7 10*3/uL (ref 0.7–4.0)
Monocytes Relative: 7 % (ref 3–12)

## 2011-12-17 LAB — CBC
MCH: 29.6 pg (ref 26.0–34.0)
MCHC: 34.7 g/dL (ref 30.0–36.0)
MCV: 85.3 fL (ref 78.0–100.0)
Platelets: 253 10*3/uL (ref 150–400)
RBC: 5.57 MIL/uL (ref 4.22–5.81)

## 2011-12-17 LAB — TROPONIN I: Troponin I: <0.3 ng/mL (ref <0.30)

## 2011-12-17 LAB — COMPREHENSIVE METABOLIC PANEL
BUN: 13 mg/dL (ref 6–23)
Calcium: 10.2 mg/dL (ref 8.4–10.5)
GFR calc Af Amer: 84 mL/min — ABNORMAL LOW (ref >90)
Glucose, Bld: 82 mg/dL (ref 70–99)
Sodium: 138 mEq/L (ref 135–145)
Total Protein: 8.2 g/dL (ref 6.0–8.3)

## 2011-12-17 LAB — POCT I-STAT TROPONIN I

## 2011-12-17 MED ORDER — HYDROCODONE-ACETAMINOPHEN 5-325 MG PO TABS
2.0000 | ORAL_TABLET | Freq: Once | ORAL | Status: AC
  Administered 2011-12-17: 2 via ORAL
  Filled 2011-12-17: qty 2

## 2011-12-17 MED ORDER — ATENOLOL 25 MG PO TABS: 50.0000 mg | ORAL_TABLET | Freq: Every day | ORAL | Status: DC

## 2011-12-17 MED ORDER — LABETALOL HCL 100 MG PO TABS
100.0000 mg | ORAL_TABLET | Freq: Once | ORAL | Status: AC
  Administered 2011-12-17: 100 mg via ORAL
  Filled 2011-12-17: qty 1

## 2011-12-17 NOTE — Discharge Instructions (Signed)
Headache Headaches are caused by many different problems. Most commonly, headache is caused by muscle tension from an injury, fatigue, or emotional upset. Excessive muscle contractions in the scalp and neck result in a headache that often feels like a tight band around the head. Tension headaches often have areas of tenderness over the scalp and the back of the neck. These headaches may last for hours, days, or longer, and some may contribute to migraines in those who have migraine problems. Migraines usually cause a throbbing headache, which is made worse by activity. Sometimes only one side of the head hurts. Nausea, vomiting, eye pain, and avoidance of food are common with migraines. Visual symptoms such as light sensitivity, blind spots, or flashing lights may also occur. Loud noises may worsen migraine headaches. Many factors may cause migraine headaches:  Emotional stress, lack of sleep, and menstrual periods.   Alcohol and some drugs (such as birth control pills).   Diet factors (fasting, caffeine, food preservatives, chocolate).   Environmental factors (weather changes, bright lights, odors, smoke).  Other causes of headaches include Spatz injuries to the head. Arthritis in the neck; problems with the jaw, eyes, ears, or nose are also causes of headaches. Allergies, drugs, alcohol, and exposure to smoke can also cause moderate headaches. Rebound headaches can occur if someone uses pain medications for a long period of time and then stops. Less commonly, blood vessel problems in the neck and brain (including stroke) can cause various types of headache. Treatment of headaches includes medicines for pain and relaxation. Ice packs or heat applied to the back of the head and neck help some people. Massaging the shoulders, neck and scalp are often very useful. Relaxation techniques and stretching can help prevent these headaches. Avoid alcohol and cigarette smoking as these tend to make headaches  worse. Please see your caregiver if your headache is not better in 2 days.  SEEK IMMEDIATE MEDICAL CARE IF:   You develop a high fever, chills, or repeated vomiting.   You faint or have difficulty with vision.   You develop unusual numbness or weakness of your arms or legs.   Relief of pain is inadequate with medication, or you develop severe pain.   You develop confusion, or neck stiffness.   You have a worsening of a headache or do not obtain relief.  Document Released: 10/08/2005 Document Revised: 06/20/2011 Document Reviewed: 04/03/2007 [facility] Patient Information 2012 [place], [place].How to Take Your Blood Pressure  These instructions are only for electronic home blood pressure machines. You will need:   An automatic or semi-automatic blood pressure machine.   Fresh batteries for the blood pressure machine.  HOW DO I USE THESE TOOLS TO CHECK MY BLOOD PRESSURE?   There are 2 numbers that make up your blood pressure. For example: 120/80.   The first number (120 in our example) is called the "systolic pressure." It is a measure of the pressure in your blood vessels when your heart is pumping blood.   The second number (80 in our example) is called the "diastolic pressure." It is a measure of the pressure in your blood vessels when your heart is resting between beats.   Before you buy a home blood pressure machine, check the size of your arm so you can buy the right size cuff. Here is how to check the size of your arm:   Use a tape measure that shows both inches and centimeters.   Wrap the tape measure around the middle upper part  of your arm. You may need someone to help you measure right.   Write down your arm measurement in both inches and centimeters.   To measure your blood pressure right, it is important to have the right size cuff.   If your arm is up to 13 inches (37 to 34 centimeters), get an adult cuff size.   If your arm is 13 to 17 inches (35 to 44 centimeters),  get a large adult cuff size.   If your arm is 17 to 20 inches (45 to 52 centimeters), get an adult thigh cuff.   Try to rest or relax for at least 30 minutes before you check your blood pressure.   Do not smoke.   Do not have any drinks with caffeine, such as:   Pop.   Coffee.   Tea.   Check your blood pressure in a quiet room.   Sit down and stretch out your arm on a table. Keep your arm at about the level of your heart. Let your arm relax.  GETTING BLOOD PRESSURE READINGS  Make sure you remove any tight-fighting clothing from your arm. Wrap the cuff around your upper arm. Wrap it just above the bend, and above where you felt the pulse. You should be able to slip a finger between the cuff and your arm. If you cannot slip a finger in the cuff, it is too tight and should be removed and rewrapped.   Some units requires you to manually pump up the arm cuff.   Automatic units inflate the cuff when you press a button.   Cuff deflation is automatic in both models.   After the cuff is inflated, the unit measures your blood pressure and pulse. The readings are displayed on a monitor. Hold still and breathe normally while the cuff is inflated.   Getting a reading takes less than a minute.   Some models store readings in a memory. Some provide a printout of readings.   Get readings at different times of the day. You should wait at least 5 minutes between readings. Take readings with you to your next doctor's visit.  Document Released: 09/20/2008 Document Revised: 06/21/2011 Document Reviewed: 09/20/2008 [facility] Patient Information 2012 [place], [place].

## 2011-12-17 NOTE — ED Provider Notes (Signed)
History     CSN: [id number]  Arrival date & time 12/17/11  1238   First MD Initiated Contact with Patient 12/17/11 1631      Chief Complaint  Patient presents with  . Headache  . Hypertension    (Consider location/radiation/quality/duration/timing/severity/associated sxs/prior treatment) HPI Comments: Was seen at [facility] and sent here.    Patient is a 56 y.o. male presenting with hypertension. The history is provided by the patient.  Hypertension This is a recurrent problem. The current episode started 6 to 12 hours ago. The problem occurs constantly. The problem has been gradually worsening. Associated symptoms include headaches. Pertinent negatives include no chest pain. The symptoms are aggravated by nothing. The symptoms are relieved by nothing. He has tried nothing for the symptoms.    Past Medical History  Diagnosis Date  . Hyperglycemia   . Gout, unspecified   . Heart block   . Hypercholesterolemia   . Chest pain   . Hyperlipidemia   . HTN (hypertension)   . Muscle spasm   . Diabetes mellitus   . Chest pain   . Abdominal pain   . Rt groin pain   . Blood in stool   . Migraines   . Stroke   . Arthritis   . Pneumonia   . Depression   . 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 Reapir of left inguinal hernia 02/02/2003    Dr. Violeta Gelinas. repair with mesh. Same procedure done again in  05/22/2004   . Rotator cuff repair 2010    left     Family History  Problem Relation Age of Onset  . Heart attack Mother     x7. Still alive  . Heart attack Father     deceased b/c of MI  . Heart attack Brother     older, deceased b/c of MI     History  Substance Use Topics  . Smoking status: Never Smoker   . Smokeless tobacco: Never Used  . Alcohol Use: 4.2 oz/week    7 Cans of beer per week     occasional beer      Review of Systems  Cardiovascular: Negative for chest  pain.  Neurological: Positive for headaches.  All other systems reviewed and are negative.    Allergies  Shellfish allergy  Home Medications   Current Outpatient Rx  Name Route Sig Dispense Refill  . ASPIRIN EC 325 MG PO TBEC Oral Take 325 mg by mouth daily.      . CYCLOBENZAPRINE HCL 10 MG PO TABS Oral Take 10 mg by mouth 3 (three) times daily as needed. For muscle spasms    . FAMOTIDINE 20 MG PO TABS Oral Take 1 tablet (20 mg total) by mouth 2 (two) times daily. 30 tablet 0  . HYDROCHLOROTHIAZIDE 25 MG PO TABS Oral Take 25 mg by mouth daily.     . INDOMETHACIN 25 MG PO CAPS Oral Take 25 mg by mouth 3 (three) times daily with meals.     Marland Kitchen LISINOPRIL 40 MG PO TABS Oral Take 40 mg by mouth daily.    Marland Kitchen MECLIZINE HCL 25 MG PO TABS Oral Take 25 mg by mouth daily as needed. For dizziness    . NITROGLYCERIN 0.4 MG SL SUBL Sublingual Place 0.4 mg under the tongue every 5 (five) minutes as needed. For chest pain    .  PRAVASTATIN SODIUM 40 MG PO TABS Oral Take 40 mg by mouth daily.    Marland Kitchen TACRINE HYDROCHLORIDE PO Oral Take 25 mg by mouth daily.        BP 155/105  Pulse 81  Temp(Src) 98.7 F (37.1 C) (Oral)  Resp 18  Ht 5\' 11"  (1.803 m)  Wt 221 lb (100.245 kg)  BMI 30.82 kg/m2  SpO2 97%  Physical Exam  Nursing note and vitals reviewed. Constitutional: He is oriented to person, place, and time. He appears well-developed and well-nourished. No distress.  HENT:  Head: Normocephalic and atraumatic.  Eyes: EOM are normal. Pupils are equal, round, and reactive to light.  Neck: Normal range of motion. Neck supple.  Cardiovascular: Normal rate and regular rhythm.   Pulmonary/Chest: Effort normal and breath sounds normal.  Abdominal: Soft. Bowel sounds are normal. He exhibits no distension.  Musculoskeletal: Normal range of motion. He exhibits no edema.  Neurological: He is alert and oriented to person, place, and time. No cranial nerve deficit. He exhibits normal muscle tone. Coordination  normal.  Skin: Skin is warm and dry. He is not diaphoretic.    ED Course  Procedures (including critical care time)   Labs Reviewed  CBC  DIFFERENTIAL  COMPREHENSIVE METABOLIC PANEL  TROPONIN I   Dg Chest 2 View  12/17/2011  *RADIOLOGY REPORT*  Clinical Data: Headache and hypertension.  CHEST - 2 VIEW  Comparison: 12/27/2010.  Findings: The cardiac silhouette, mediastinal and hilar contours are stable.  There are chronic bronchitic type lung changes and areas of linear scarring.  No infiltrates, edema or effusions.  The bony thorax is intact.  IMPRESSION: Chronic bronchitic type lung changes and areas of linear scarring. No acute pulmonary findings.  Original Report Authenticated By: P. Loralie Champagne, M.D.   Ct Head Wo Contrast  12/17/2011  *RADIOLOGY REPORT*  Clinical Data: Headache, left-sided numbness, dizziness  CT HEAD WITHOUT CONTRAST  Technique:  Contiguous axial images were obtained from the base of the skull through the vertex without contrast.  Comparison: MRI brain dated 01/21/2011  Findings: No evidence of parenchymal hemorrhage or extra-axial fluid collection. No mass lesion, mass effect, or midline shift.  No CT evidence of acute infarction.  Mild periventricular small vessel ischemic changes.  Cerebral volume is age appropriate.  No ventriculomegaly.  The visualized paranasal sinuses are essentially clear. The mastoid air cells are unopacified.  No evidence of calvarial fracture.  IMPRESSION: No evidence of acute intracranial abnormality.  Mild small vessel ischemic changes.  Original Report Authenticated By: Charline Bills, M.D.     No diagnosis found.   Date: 12/17/2011  Rate: 65  Rhythm: normal sinus rhythm  QRS Axis: normal  Intervals: normal  ST/T Wave abnormalities: normal  Conduction Disutrbances:none  Narrative Interpretation:   Old EKG Reviewed: none available    MDM  The ct and labs appear unremarkable.  His blood pressure has improved with labetolol.   I will discharge him to home, and add a beta blocker to his medications.  He is to check his bp regularly and follow up with primary care in the near future.        Geoffery Lyons, MD 12/17/11 [phone]

## 2011-12-17 NOTE — ED Notes (Signed)
Patient reports onset of headache 3 days ago with intermittent numbness in his left side.  He reports onset of chest pain last night.  Patient was sent from [facility] due to hypertension and patient complaints.  He also reports he is dizzy

## 2011-12-17 NOTE — ED Notes (Signed)
Dr. Judd Lien informed & aware of continued elevated BP

## 2011-12-17 NOTE — ED Notes (Addendum)
Reports h/a "all over" x 3 days. OTC ibuprofen not helping. Also c/o SSCP last night while laying in bed, took NTG x1 with relief. Today while on the way to [facility] SSCP retuerned at 1130. Denies SOB, n/v, diaphoresis. Reports CP has eased off but has not gone away completely. States h/a is bothering him more than CP. Also states that he didn't take his BP meds today

## 2011-12-20 ENCOUNTER — Telehealth (INDEPENDENT_AMBULATORY_CARE_PROVIDER_SITE_OTHER): Payer: Self-pay

## 2011-12-20 NOTE — Telephone Encounter (Signed)
Per Dr Janee Morn, I called in Hydrocodone 5/325 1 tab po q4-6hrs prn pain #30 no refill, generic allowed to [facility] pharmacy.  Pt notified

## 2011-12-20 NOTE — Telephone Encounter (Signed)
Message copied by Ivory Broad on Thu Dec 20, 2011  4:36 PM ------      Message from: Liz Malady      Created: Thu Dec 20, 2011  4:08 PM       Please call in the vicodin protocol. THX

## 2011-12-20 NOTE — Telephone Encounter (Signed)
The patient called and stated that he was at [facility] Monday and they sent him to the ER because his Bp was up and they wanted to make sure he wasn't having a stroke.  They did a brain MRI and said it was ok.  He is on medicine and his Bp is running good.  He would like to schedule his hernia repair.  He would also like something for pain.  He has no allergies and uses [facility] on [address].  I told him I would check with Dr Janee Morn and it may be tomorrow before I know anything.

## 2011-12-30 ENCOUNTER — Encounter (HOSPITAL_COMMUNITY): Payer: Self-pay | Admitting: Emergency Medicine

## 2011-12-30 ENCOUNTER — Emergency Department (HOSPITAL_COMMUNITY)
Admission: EM | Admit: 2011-12-30 | Discharge: 2011-12-30 | Disposition: A | Discharge status: Home / Self Care | Payer: Self-pay | Attending: Emergency Medicine | Admitting: Emergency Medicine

## 2011-12-30 ENCOUNTER — Emergency Department (HOSPITAL_COMMUNITY): Payer: Self-pay

## 2011-12-30 ENCOUNTER — Other Ambulatory Visit: Payer: Self-pay

## 2011-12-30 DIAGNOSIS — F341 Dysthymic disorder: Secondary | ICD-10-CM | POA: Insufficient documentation

## 2011-12-30 DIAGNOSIS — E785 Hyperlipidemia, unspecified: Secondary | ICD-10-CM | POA: Insufficient documentation

## 2011-12-30 DIAGNOSIS — I252 Old myocardial infarction: Secondary | ICD-10-CM | POA: Insufficient documentation

## 2011-12-30 DIAGNOSIS — E7800 Pure hypercholesterolemia, unspecified: Secondary | ICD-10-CM | POA: Insufficient documentation

## 2011-12-30 DIAGNOSIS — Z7982 Long term (current) use of aspirin: Secondary | ICD-10-CM | POA: Insufficient documentation

## 2011-12-30 DIAGNOSIS — I10 Essential (primary) hypertension: Secondary | ICD-10-CM | POA: Insufficient documentation

## 2011-12-30 DIAGNOSIS — Z8673 Personal history of transient ischemic attack (TIA), and cerebral infarction without residual deficits: Secondary | ICD-10-CM | POA: Insufficient documentation

## 2011-12-30 DIAGNOSIS — Z79899 Other long term (current) drug therapy: Secondary | ICD-10-CM | POA: Insufficient documentation

## 2011-12-30 DIAGNOSIS — R079 Chest pain, unspecified: Secondary | ICD-10-CM | POA: Insufficient documentation

## 2011-12-30 DIAGNOSIS — R064 Hyperventilation: Secondary | ICD-10-CM | POA: Insufficient documentation

## 2011-12-30 DIAGNOSIS — M129 Arthropathy, unspecified: Secondary | ICD-10-CM | POA: Insufficient documentation

## 2011-12-30 DIAGNOSIS — E119 Type 2 diabetes mellitus without complications: Secondary | ICD-10-CM | POA: Insufficient documentation

## 2011-12-30 DIAGNOSIS — Z8639 Personal history of other endocrine, nutritional and metabolic disease: Secondary | ICD-10-CM | POA: Insufficient documentation

## 2011-12-30 DIAGNOSIS — Z862 Personal history of diseases of the blood and blood-forming organs and certain disorders involving the immune mechanism: Secondary | ICD-10-CM | POA: Insufficient documentation

## 2011-12-30 DIAGNOSIS — R091 Pleurisy: Secondary | ICD-10-CM | POA: Insufficient documentation

## 2011-12-30 LAB — BASIC METABOLIC PANEL
Chloride: 104 mEq/L (ref 96–112)
GFR calc Af Amer: 55 mL/min — ABNORMAL LOW (ref >90)
Potassium: 4.2 mEq/L (ref 3.5–5.1)

## 2011-12-30 LAB — CBC
Platelets: 199 10*3/uL (ref 150–400)
RDW: 14.1 % (ref 11.5–15.5)
WBC: 5.3 10*3/uL (ref 4.0–10.5)

## 2011-12-30 LAB — TROPONIN I: Troponin I: <0.3 ng/mL (ref <0.30)

## 2011-12-30 MED ORDER — ONDANSETRON HCL 4 MG/2ML IJ SOLN
4.0000 mg | Freq: Once | INTRAMUSCULAR | Status: AC
  Administered 2011-12-30: 4 mg via INTRAVENOUS
  Filled 2011-12-30: qty 2

## 2011-12-30 MED ORDER — NITROGLYCERIN 0.4 MG SL SUBL
0.4000 mg | SUBLINGUAL_TABLET | SUBLINGUAL | Status: AC | PRN
  Administered 2011-12-30 (×3): 0.4 mg via SUBLINGUAL
  Filled 2011-12-30 (×3): qty 25

## 2011-12-30 MED ORDER — SODIUM CHLORIDE 0.9 % IV BOLUS (SEPSIS)
1000.0000 mL | Freq: Once | INTRAVENOUS | Status: AC
  Administered 2011-12-30: 1000 mL via INTRAVENOUS

## 2011-12-30 MED ORDER — MORPHINE SULFATE 4 MG/ML IJ SOLN
4.0000 mg | Freq: Once | INTRAMUSCULAR | Status: AC
  Administered 2011-12-30: 4 mg via INTRAVENOUS
  Filled 2011-12-30: qty 1

## 2011-12-30 NOTE — ED Notes (Signed)
No new rx given, pt voiced understanding to f/u with PCP.  Chaplain paged to take pt back upstairs to see his wife.

## 2011-12-30 NOTE — Discharge Instructions (Signed)
Pleurisy  Pleurisy is an inflammation and swelling of the lining of the lungs. It usually is the result of an underlying infection or other disease. Because of this inflammation, it hurts to breathe. It is aggravated by coughing or deep breathing. The primary goal in treating pleurisy is to diagnose and treat the condition that caused it.   HOME CARE INSTRUCTIONS    Only take over-the-counter or prescription medicines for pain, discomfort, or fever as directed by your caregiver.   If medications which kill germs (antibiotics) were prescribed, take the entire course. Even if you are feeling better, you need to take them.   Use a cool mist vaporizer to help loosen secretions. This is so the secretions can be coughed up more easily.  SEEK MEDICAL CARE IF:    Your pain is not controlled with medication or is increasing.   You have an increase inpus like (purulent) secretions brought up with coughing.  SEEK IMMEDIATE MEDICAL CARE IF:    You have blue or dark lips, fingernails, or toenails.   You begin coughing up blood.   You have increased difficulty breathing.   You have continuing pain unrelieved by medicine or lasting more than 1 week.   You have pain that radiates into your neck, arms, or jaw.   You develop increased shortness of breath or wheezing.   You develop a fever, rash, vomiting, fainting, or other serious complaints.  Document Released: 10/08/2005 Document Revised: 09/27/2011 Document Reviewed: 05/09/2007  [employer] Patient Information 2012 [employer]

## 2011-12-30 NOTE — ED Notes (Signed)
Pt was upstairs visiting his wife when she coded, pt became very upset, was brought to ED c/o chest pain and hyperventilating.

## 2011-12-30 NOTE — ED Notes (Addendum)
Oxygen at 2 L/M per cannula begun

## 2011-12-30 NOTE — ED Provider Notes (Signed)
History     CSN: [id number]  Arrival date & time 12/30/11  1539   First MD Initiated Contact with Patient 12/30/11 1546      Chief Complaint  Patient presents with  . Chest Pain    (Consider location/radiation/quality/duration/timing/severity/associated sxs/prior treatment) HPI Pt presents to the ED with complaints of chest pain. The patient was upstairs with his wife and kids. His wife has cancer with mets to the brain and coded which incited his chest pain.  The patient arrived anxious and hyperventilating. As I began to speak with him his mood is depressed. He states that he had a Cath done in may 2011 by Dr. Eden Emms and that the results showed some blockage. He takes BP medications. He has taken 324mg  aspirin prior to arriving to the ED.   Past Medical History  Diagnosis Date  . Hyperglycemia   . Gout, unspecified   . Heart block   . Hypercholesterolemia   . Chest pain   . Hyperlipidemia   . HTN (hypertension)   . Muscle spasm   . Diabetes mellitus   . Chest pain   . Abdominal pain   . Rt groin pain   . Blood in stool   . Migraines   . Stroke   . Arthritis   . Pneumonia   . Depression   . 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 Reapir of left inguinal hernia 02/02/2003    Dr. Violeta Gelinas. repair with mesh. Same procedure done again in  05/22/2004   . Rotator cuff repair 2010    left     Family History  Problem Relation Age of Onset  . Heart attack Mother     x7. Still alive  . Heart attack Father     deceased b/c of MI  . Heart attack Brother     older, deceased b/c of MI     History  Substance Use Topics  . Smoking status: Never Smoker   . Smokeless tobacco: Never Used  . Alcohol Use: 4.2 oz/week    7 Cans of beer per week     occasional beer      Review of Systems  All other systems reviewed and are negative.    Allergies  Shellfish allergy  Home  Medications   Current Outpatient Rx  Name Route Sig Dispense Refill  . ASPIRIN EC 325 MG PO TBEC Oral Take 325 mg by mouth daily.      . ATENOLOL 25 MG PO TABS Oral Take 50 mg by mouth daily.    . CYCLOBENZAPRINE HCL 10 MG PO TABS Oral Take 10 mg by mouth 3 (three) times daily as needed. For muscle spasms    . HYDROCHLOROTHIAZIDE 25 MG PO TABS Oral Take 25 mg by mouth daily.     Marland Kitchen HYDROCODONE-ACETAMINOPHEN 5-325 MG PO TABS Oral Take 1 tablet by mouth every 8 (eight) hours as needed. For pain    . IBUPROFEN 200 MG PO TABS Oral Take 200 mg by mouth every 6 (six) hours as needed. For pain    . INDOMETHACIN 25 MG PO CAPS Oral Take 25 mg by mouth 3 (three) times daily with meals.     Marland Kitchen GERITOL PO Oral Take 2 tablets by mouth daily.    Marland Kitchen LISINOPRIL 40 MG PO TABS Oral Take 40 mg by mouth daily.    Marland Kitchen  MECLIZINE HCL 25 MG PO TABS Oral Take 25 mg by mouth daily as needed. For dizziness    . NITROGLYCERIN 0.4 MG SL SUBL Sublingual Place 0.4 mg under the tongue every 5 (five) minutes as needed. For chest pain    . OMEPRAZOLE 20 MG PO CPDR Oral Take 20 mg by mouth daily.    Marland Kitchen PRAVASTATIN SODIUM 40 MG PO TABS Oral Take 40 mg by mouth daily.      BP 126/86  Pulse 61  Temp(Src) 98.2 F (36.8 C) (Oral)  Resp 11  SpO2 99%  Physical Exam  Nursing note and vitals reviewed. Constitutional: He appears well-developed and well-nourished. No distress.  HENT:  Head: Normocephalic and atraumatic.  Eyes: Pupils are equal, round, and reactive to light.  Neck: Normal range of motion. Neck supple.  Cardiovascular: Normal rate and regular rhythm.   Pulmonary/Chest: Effort normal. He exhibits no tenderness.  Abdominal: Soft.  Neurological: He is alert.  Skin: Skin is warm and dry.  Psychiatric: His mood appears anxious. His affect is not angry, not blunt, not labile and not inappropriate. His speech is not rapid and/or pressured, not delayed and not slurred. He exhibits a depressed mood.    ED Course    Procedures (including critical care time)  Labs Reviewed  BASIC METABOLIC PANEL - Abnormal; Notable for the following:    Creatinine, Ser 1.57 (*)    GFR calc non Af Amer 48 (*)    GFR calc Af Amer 55 (*)    All other components within normal limits  CBC  TROPONIN I  TROPONIN I   Dg Chest 2 View  12/30/2011  *RADIOLOGY REPORT*  Clinical Data: Chest pain and shortness of breath.  CHEST - 2 VIEW  Comparison: 12/17/2011.  Findings: The heart is borderline enlarged but stable.  The mediastinal and hilar contours are within normal limits.  Low lung volumes with vascular crowding and bibasilar atelectasis.  There are bibasilar scarring changes also.  IMPRESSION: Very low lung volumes with vascular crowding and atelectasis.  Original Report Authenticated By: P. Loralie Champagne, M.D.     1. Pleurisy       MDM     Date: 12/30/2011  Rate: 63  Rhythm: normal sinus rhythm  QRS Axis: normal  Intervals: normal  ST/T Wave abnormalities: nonspecific T wave changes  Conduction Disutrbances:none  Narrative Interpretation:   Old EKG Reviewed: changes noted nonspecific T-wave abnormalities not noted on Dec 17, 2011   The patient remained in a depressed mood and complaining of pain all over. He had two Troponins which both were negative. Pt is anxious but is comfortable with the plan to go home.  I have discussed the patient with my supervising attending who has also evaluated patient and agrees with my plan to discharge patient with instructions to follow-up with his cardiologist.   Pt has been advised of the symptoms that warrant their return to the ED. Patient has voiced understanding and has agreed to follow-up with the PCP or specialist.          Dorthula Matas, PA 12/30/11 2031

## 2011-12-30 NOTE — ED Notes (Signed)
Patient states that he was up stairs visiting his wife. States that he was told that she was going to die.  That is when the symptoms began.  He C/O substernal chest pain, pain in his left arm and on the left side of his head.  Also states that his left hand is going numb.

## 2011-12-30 NOTE — ED Provider Notes (Signed)
Medical screening examination/treatment/procedure(s) were conducted as a shared visit with non-physician practitioner(s) and myself.  I personally evaluated the patient during the encounter  No longer c/o chest pain. Does have RF for CAD but experienced sx just after his wife suffered cardiac arrest upstairs. Associated hyperventilation. Troponin neg x 2. EKG without ischemic changes. Declining chaplain support in ED.  Forbes Cellar, MD 12/30/11 2046

## 2011-12-31 ENCOUNTER — Other Ambulatory Visit: Payer: Self-pay | Admitting: Cardiovascular Disease

## 2011-12-31 MED ORDER — NITROGLYCERIN 0.4 MG SL SUBL: 0.4000 mg | SUBLINGUAL_TABLET | SUBLINGUAL | Status: DC | PRN

## 2011-12-31 NOTE — Telephone Encounter (Signed)
Pt needs this refill called in asap she is having some chest pain because his wife just died and he is taking it hard and can we call in something that is not $40 because he can't afford that

## 2012-01-14 ENCOUNTER — Telehealth (INDEPENDENT_AMBULATORY_CARE_PROVIDER_SITE_OTHER): Payer: Self-pay | Admitting: General Surgery

## 2012-01-14 ENCOUNTER — Encounter (INDEPENDENT_AMBULATORY_CARE_PROVIDER_SITE_OTHER): Payer: Self-pay | Admitting: Surgery

## 2012-01-14 ENCOUNTER — Other Ambulatory Visit (INDEPENDENT_AMBULATORY_CARE_PROVIDER_SITE_OTHER): Payer: Self-pay | Admitting: General Surgery

## 2012-01-14 NOTE — Telephone Encounter (Signed)
Taken care by urgent office per Annie/ Dr. Magnus Ivan

## 2012-01-14 NOTE — Telephone Encounter (Signed)
C/o pain due to hernia- level 8- no problems with BM's, or voiding.  Patient request percocet.

## 2012-01-14 NOTE — Telephone Encounter (Signed)
Called pt to let him know that Dr Magnus Ivan written a Rx for percocet 5/325 1-2 po q 4 h rs prn for #40 and pt is aware to come up to the office to pick up Rx

## 2012-01-14 NOTE — Telephone Encounter (Signed)
Patient pain level 9 due hernia. No problems with bowel function or urinary problems. Patient requested Percocet.   Ok per Dr. Janee Morn to have Rx for Percocet. Have patient to follow up with surgery.

## 2012-01-26 ENCOUNTER — Encounter (HOSPITAL_COMMUNITY): Payer: Self-pay | Admitting: Emergency Medicine

## 2012-01-26 ENCOUNTER — Emergency Department (HOSPITAL_COMMUNITY): Payer: Self-pay

## 2012-01-26 ENCOUNTER — Emergency Department (HOSPITAL_COMMUNITY)
Admission: EM | Admit: 2012-01-26 | Discharge: 2012-01-26 | Disposition: A | Discharge status: Home / Self Care | Payer: Self-pay | Attending: Emergency Medicine | Admitting: Emergency Medicine

## 2012-01-26 ENCOUNTER — Other Ambulatory Visit: Payer: Self-pay

## 2012-01-26 DIAGNOSIS — I10 Essential (primary) hypertension: Secondary | ICD-10-CM | POA: Insufficient documentation

## 2012-01-26 DIAGNOSIS — Z79899 Other long term (current) drug therapy: Secondary | ICD-10-CM | POA: Insufficient documentation

## 2012-01-26 DIAGNOSIS — M129 Arthropathy, unspecified: Secondary | ICD-10-CM | POA: Insufficient documentation

## 2012-01-26 DIAGNOSIS — E7800 Pure hypercholesterolemia, unspecified: Secondary | ICD-10-CM | POA: Insufficient documentation

## 2012-01-26 DIAGNOSIS — I252 Old myocardial infarction: Secondary | ICD-10-CM | POA: Insufficient documentation

## 2012-01-26 DIAGNOSIS — Z7982 Long term (current) use of aspirin: Secondary | ICD-10-CM | POA: Insufficient documentation

## 2012-01-26 DIAGNOSIS — E785 Hyperlipidemia, unspecified: Secondary | ICD-10-CM | POA: Insufficient documentation

## 2012-01-26 DIAGNOSIS — Z8673 Personal history of transient ischemic attack (TIA), and cerebral infarction without residual deficits: Secondary | ICD-10-CM | POA: Insufficient documentation

## 2012-01-26 DIAGNOSIS — R4589 Other symptoms and signs involving emotional state: Secondary | ICD-10-CM | POA: Insufficient documentation

## 2012-01-26 DIAGNOSIS — R404 Transient alteration of awareness: Secondary | ICD-10-CM | POA: Insufficient documentation

## 2012-01-26 DIAGNOSIS — F430 Acute stress reaction: Secondary | ICD-10-CM

## 2012-01-26 DIAGNOSIS — E119 Type 2 diabetes mellitus without complications: Secondary | ICD-10-CM | POA: Insufficient documentation

## 2012-01-26 DIAGNOSIS — R569 Unspecified convulsions: Secondary | ICD-10-CM | POA: Insufficient documentation

## 2012-01-26 DIAGNOSIS — Z862 Personal history of diseases of the blood and blood-forming organs and certain disorders involving the immune mechanism: Secondary | ICD-10-CM | POA: Insufficient documentation

## 2012-01-26 DIAGNOSIS — Z8639 Personal history of other endocrine, nutritional and metabolic disease: Secondary | ICD-10-CM | POA: Insufficient documentation

## 2012-01-26 DIAGNOSIS — R079 Chest pain, unspecified: Secondary | ICD-10-CM | POA: Insufficient documentation

## 2012-01-26 LAB — CBC
Hemoglobin: 14.9 g/dL (ref 13.0–17.0)
MCH: 28.9 pg (ref 26.0–34.0)
Platelets: 223 10*3/uL (ref 150–400)
RBC: 5.16 MIL/uL (ref 4.22–5.81)
WBC: 6 10*3/uL (ref 4.0–10.5)

## 2012-01-26 LAB — COMPREHENSIVE METABOLIC PANEL
ALT: 20 U/L (ref 0–53)
Alkaline Phosphatase: 48 U/L (ref 39–117)
BUN: 16 mg/dL (ref 6–23)
CO2: 18 mEq/L — ABNORMAL LOW (ref 19–32)
Chloride: 108 mEq/L (ref 96–112)
GFR calc Af Amer: 57 mL/min — ABNORMAL LOW (ref >90)
GFR calc non Af Amer: 49 mL/min — ABNORMAL LOW (ref >90)
Glucose, Bld: 98 mg/dL (ref 70–99)
Potassium: 4.2 mEq/L (ref 3.5–5.1)
Sodium: 136 mEq/L (ref 135–145)
Total Bilirubin: 0.2 mg/dL — ABNORMAL LOW (ref 0.3–1.2)

## 2012-01-26 LAB — POCT I-STAT TROPONIN I: Troponin i, poc: 0 ng/mL (ref 0.00–0.08)

## 2012-01-26 LAB — URINALYSIS, ROUTINE W REFLEX MICROSCOPIC
Bilirubin Urine: NEGATIVE
Ketones, ur: NEGATIVE mg/dL
Leukocytes, UA: NEGATIVE
Nitrite: NEGATIVE
Protein, ur: NEGATIVE mg/dL
pH: 5.5 (ref 5.0–8.0)

## 2012-01-26 LAB — DIFFERENTIAL
Eosinophils Absolute: 0.1 10*3/uL (ref 0.0–0.7)
Lymphocytes Relative: 30 % (ref 12–46)
Lymphs Abs: 1.8 10*3/uL (ref 0.7–4.0)
Monocytes Relative: 8 % (ref 3–12)
Neutrophils Relative %: 61 % (ref 43–77)

## 2012-01-26 LAB — ETHANOL: Alcohol, Ethyl (B): <11 mg/dL (ref 0–11)

## 2012-01-26 LAB — RAPID URINE DRUG SCREEN, HOSP PERFORMED
Barbiturates: NOT DETECTED
Benzodiazepines: NOT DETECTED

## 2012-01-26 LAB — SALICYLATE LEVEL: Salicylate Lvl: <2 mg/dL — ABNORMAL LOW (ref 2.8–20.0)

## 2012-01-26 MED ORDER — DIPHENHYDRAMINE HCL 50 MG/ML IJ SOLN
25.0000 mg | Freq: Once | INTRAMUSCULAR | Status: AC
  Administered 2012-01-26: 25 mg via INTRAVENOUS
  Filled 2012-01-26: qty 1

## 2012-01-26 MED ORDER — LORAZEPAM 2 MG/ML IJ SOLN
INTRAMUSCULAR | Status: AC
  Administered 2012-01-26: 1 mg
  Filled 2012-01-26: qty 1

## 2012-01-26 MED ORDER — ACETAMINOPHEN 325 MG PO TABS
975.0000 mg | ORAL_TABLET | Freq: Once | ORAL | Status: AC
  Administered 2012-01-26: 975 mg via ORAL
  Filled 2012-01-26: qty 3

## 2012-01-26 MED ORDER — TRAMADOL HCL 50 MG PO TABS: 50.0000 mg | ORAL_TABLET | Freq: Four times a day (QID) | ORAL | Status: AC | PRN

## 2012-01-26 MED ORDER — METOCLOPRAMIDE HCL 5 MG/ML IJ SOLN
10.0000 mg | Freq: Once | INTRAMUSCULAR | Status: AC
  Administered 2012-01-26: 10 mg via INTRAVENOUS
  Filled 2012-01-26: qty 2

## 2012-01-26 MED ORDER — ASPIRIN 325 MG PO TABS
325.0000 mg | ORAL_TABLET | Freq: Once | ORAL | Status: DC
  Filled 2012-01-26: qty 1

## 2012-01-26 MED ORDER — KETOROLAC TROMETHAMINE 30 MG/ML IJ SOLN
30.0000 mg | Freq: Once | INTRAMUSCULAR | Status: AC
  Administered 2012-01-26: 30 mg via INTRAVENOUS
  Filled 2012-01-26: qty 1

## 2012-01-26 MED ORDER — ACETAMINOPHEN 500 MG PO TABS: 1000.0000 mg | ORAL_TABLET | Freq: Once | ORAL | Status: DC

## 2012-01-26 NOTE — ED Provider Notes (Signed)
History     CSN: [id number]  Arrival date & time 01/26/12  1459   First MD Initiated Contact with Patient 01/26/12 1504      Chief Complaint  Patient presents with  . Seizures    (Consider location/radiation/quality/duration/timing/severity/associated sxs/prior treatment) HPI Pt arrives via ems with reported multiple episodes of shaking and not responding to bystanders.  The pt had one episode while at his brothers funeral, 2 more en route to ED and 2 more o/a to the ED.  EMS and rn states that the pt shakes all over for a few seconds.  During my exam, the pt shook as if he was having chills for a few seconds on his left side.  Then at one point shook all over his body lasting 5-10 secs, during the shaking episode the pt blinked when confronted with me waving my hands at him then immediately after the episode followed commands, answered questions about orientation, was oriented to person and age.  Sx's moderate, seem to be aggravated by recent stress as pt lost his wife several weeks ago and today was at his brothers funeral.  Pt also admitted to having some chest tightness while at the funeral, took 2 of his ntg. Past Medical History  Diagnosis Date  . Hyperglycemia   . Gout, unspecified   . Heart block   . Hypercholesterolemia   . Chest pain   . Hyperlipidemia   . HTN (hypertension)   . Muscle spasm   . Diabetes mellitus   . Chest pain   . Abdominal pain   . Rt groin pain   . Blood in stool   . Migraines   . Stroke   . Arthritis   . Pneumonia   . Depression   . 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 Reapir of left inguinal hernia 02/02/2003    Dr. Violeta Gelinas. repair with mesh. Same procedure done again in  05/22/2004   . Rotator cuff repair 2010    left     Family History  Problem Relation Age of Onset  . Heart attack Mother     x7. Still alive  . Heart attack Father    deceased b/c of MI  . Heart attack Brother     older, deceased b/c of MI     History  Substance Use Topics  . Smoking status: Never Smoker   . Smokeless tobacco: Never Used  . Alcohol Use: 4.2 oz/week    7 Cans of beer per week     occasional beer      Review of Systems  Unable to perform ROS: Mental status change  Respiratory: Positive for chest tightness.   Neurological: Positive for seizures (possible sz activity).  Psychiatric/Behavioral: Positive for decreased concentration.    Allergies  Shellfish allergy  Home Medications   Current Outpatient Rx  Name Route Sig Dispense Refill  . ASPIRIN EC 325 MG PO TBEC Oral Take 325 mg by mouth daily.      . ATENOLOL 25 MG PO TABS Oral Take 50 mg by mouth daily.    Marland Kitchen COREG PO Oral Take 1 tablet by mouth 2 (two) times daily.     . CYCLOBENZAPRINE HCL 10 MG PO TABS Oral Take 10 mg by mouth 3 (three) times daily as needed. For muscle spasms    . HYDROCHLOROTHIAZIDE 25 MG PO  TABS Oral Take 25 mg by mouth daily.     Marland Kitchen HYDROCODONE-ACETAMINOPHEN 5-325 MG PO TABS Oral Take 1 tablet by mouth every 4 (four) hours as needed. For pain.    . IBUPROFEN 200 MG PO TABS Oral Take 200 mg by mouth every 6 (six) hours as needed. For pain    . INDOMETHACIN 25 MG PO CAPS Oral Take 25 mg by mouth 3 (three) times daily with meals.     Marland Kitchen GERITOL PO Oral Take 2 tablets by mouth daily.    Marland Kitchen LISINOPRIL 40 MG PO TABS Oral Take 40 mg by mouth daily.    Marland Kitchen MECLIZINE HCL 25 MG PO TABS Oral Take 25 mg by mouth daily as needed. For dizziness    . NITROGLYCERIN 0.4 MG SL SUBL Sublingual Place 0.4 mg under the tongue every 5 (five) minutes x 3 doses as needed. For chest pain    . OMEPRAZOLE 20 MG PO CPDR Oral Take 20 mg by mouth daily.    . OXYCODONE-ACETAMINOPHEN 5-325 MG PO TABS Oral Take 1 tablet by mouth every 4 (four) hours as needed. For pain.    Marland Kitchen PRAVASTATIN SODIUM 40 MG PO TABS Oral Take 40 mg by mouth daily.    . TRAMADOL HCL 50 MG PO TABS Oral Take 1  tablet (50 mg total) by mouth every 6 (six) hours as needed for pain. 15 tablet 0    BP 149/99  Pulse 80  Temp(Src) 98.3 F (36.8 C) (Oral)  Resp 16  SpO2 100%  Physical Exam  Nursing note and vitals reviewed. Constitutional: He appears well-developed and well-nourished.  HENT:  Head: Normocephalic and atraumatic.  Eyes: Right eye exhibits no discharge. Left eye exhibits no discharge.  Neck: Normal range of motion. Neck supple.  Cardiovascular: Normal rate, regular rhythm and normal heart sounds.   Pulmonary/Chest: Effort normal and breath sounds normal.  Abdominal: Soft. There is no tenderness.  Musculoskeletal: Normal range of motion. He exhibits no tenderness.  Neurological: He is alert. He has normal strength. No cranial nerve deficit or sensory deficit. GCS eye subscore is 4. GCS verbal subscore is 5. GCS motor subscore is 6.  Skin: Skin is warm and dry.  Psychiatric: He has a normal mood and affect. His behavior is normal.    ED Course  Procedures (including critical care time)  Labs Reviewed  COMPREHENSIVE METABOLIC PANEL - Abnormal; Notable for the following:    CO2 18 (*)    Creatinine, Ser 1.54 (*)    Albumin 3.3 (*)    Total Bilirubin 0.2 (*)    GFR calc non Af Amer 49 (*)    GFR calc Af Amer 57 (*)    All other components within normal limits  URINE RAPID DRUG SCREEN (HOSP PERFORMED) - Abnormal; Notable for the following:    Cocaine POSITIVE (*)    All other components within normal limits  SALICYLATE LEVEL - Abnormal; Notable for the following:    Salicylate Lvl <2.0 (*)    All other components within normal limits  CBC  DIFFERENTIAL  URINALYSIS, ROUTINE W REFLEX MICROSCOPIC  ETHANOL  ACETAMINOPHEN LEVEL  POCT I-STAT TROPONIN I  POCT I-STAT TROPONIN I  LAB REPORT - SCANNED   Dg Chest 1 View  01/26/2012  *RADIOLOGY REPORT*  Clinical Data: Chest tightness  CHEST - 1 VIEW  Comparison: 12/30/2011  Findings: Heart size appears normal.  The heart size  appears mildly enlarged.  There is no pleural effusion or edema identified.  The lung volumes appear low.  There is no airspace consolidation.  IMPRESSION: 1.  Low lung volumes. 2.  No pneumonia.  Original Report Authenticated By: Rosealee Albee, M.D.   Ct Head Wo Contrast  01/26/2012  *RADIOLOGY REPORT*  Clinical Data:  Seizure  CT HEAD WITHOUT CONTRAST  Technique:  Contiguous axial images were obtained from the base of the skull through the vertex without contrast  Comparison:  12/17/2011  Findings:  The brain has a normal appearance without evidence for hemorrhage, acute infarction, hydrocephalus, or mass lesion.  There is no extra axial fluid collection.  The skull and paranasal sinuses are normal.  IMPRESSION: Normal CT of the head without contrast.  Original Report Authenticated By: Camelia Phenes, M.D.     1. Chest pain   2. Emotional crisis, acute reaction to stress       MDM  Pt is in nad, afvss, nontoxic appearing, exam and hx as above.  Pt had episode while I was in the room, he made intentional moves to avoid confrontation when I waved my hands toward him during whole body shaking which appeared to be more myoclonic jerking.  He immediately afterward answered questions and followed commands and does not appear to be in post ictal state.  Pt has no h/o sz.  Doubt partial or complete sz.  Likely related to extreme emotional state.  Will get basic labs, tox screen, trop as pt had chest tightness while at funeral and has h/o cad.  Also getting ct head.  Recheck, pt awake and alert, answers question.  Results d/w pt, he states he was around people smoking cocaine but denies personal use.  Pt able to ambulate around the ED, he has friends and family that are going to take him home with them.  Return warnings given.      Elijio Miles, MD 01/27/12 [phone]

## 2012-01-26 NOTE — Discharge Instructions (Signed)
Anxiety and Panic Attacks Anxiety is your body's way of reacting to real danger or something you think is a danger. It may be fear or worry over a situation like losing your job. Sometimes the cause is not known. A panic attack is made up of physical signs like sweating, shaking, or chest pain. Anxiety and panic attacks may start suddenly. They may be strong. They may come at any time of day, even while sleeping. They may come at any time of life. Panic attacks are scary, but they do not harm you physically.  HOME CARE  Avoid any known causes of your anxiety.   Try to relax. Yoga may help. Tell yourself everything will be okay.   Exercise often.   Get expert advice and help (therapy) to stop anxiety or attacks from happening.   Avoid caffeine, alcohol, and drugs.   Only take medicine as told by your doctor.  GET HELP RIGHT AWAY IF:  Your attacks seem different than normal attacks.   Your problems are getting worse or concern you.  MAKE SURE YOU:  Understand these instructions.   Will watch your condition.   Will get help right away if you are not doing well or get worse.  Document Released: 11/10/2010 Document Revised: 09/27/2011 Document Reviewed: 11/10/2010 [facility] Patient Information 2012 [place], [place].Chest Pain (Nonspecific) Chest pain has many causes. Your pain could be caused by something serious, such as a heart attack or a blood clot in the lungs. It could also be caused by something less serious, such as a chest bruise or a virus. Follow up with your doctor. More lab tests or other studies may be needed to find the cause of your pain. Most of the time, nonspecific chest pain will improve within 2 to 3 days of rest and mild pain medicine. HOME CARE  For chest bruises, you may put ice on the sore area for 15 to 20 minutes, 3 to 4 times a day. Do this only if it makes you or your child feel better.   Put ice in a plastic bag.   Place a towel between the skin and the bag.    Rest for the next 2 to 3 days.   Go back to work if the pain improves.   See your doctor if the pain lasts longer than 1 to 2 weeks.   Only take medicine as told by your doctor.   Quit smoking if you smoke.  GET HELP RIGHT AWAY IF:   There is more pain or pain that spreads to the arm, neck, jaw, back, or belly (abdomen).   You or your child has shortness of breath.   You or your child coughs more than usual or coughs up blood.   You or your child has very bad back or belly pain, feels sick to his or her stomach (nauseous), or throws up (vomits).   You or your child has very bad weakness.   You or your child passes out (faints).   You or your child has a temperature by mouth above 102 F (38.9 C), not controlled by medicine.  Any of these problems may be serious and may be an emergency. Do not wait to see if the problems will go away. Get medical help right away. Call your local emergency services 911 in [place] Do not drive yourself to the hospital. MAKE SURE YOU:   Understand these instructions.   Will watch this condition.   Will get help right away if  you or your child is not doing well or gets worse.  Document Released: 03/26/2008 Document Revised: 09/27/2011 Document Reviewed: 03/26/2008 [facility] Patient Information 2012 [place], [place].  Return for any new or worsening symptoms or any other concerns.

## 2012-01-26 NOTE — ED Notes (Signed)
Pt able to stand and get dressed, pt ambulatory and able to swallow water with out problems. Pt able to tolerate activity without problems. Pt aware of discharge.

## 2012-01-26 NOTE — ED Notes (Signed)
Patient transported to CT 

## 2012-01-26 NOTE — ED Provider Notes (Addendum)
56 year old male was witnessed to have what was felt to be seizures. Have not aorta talk with the people who saw the seizures at this time. He was in a stressful situation feeling body at his brothers funeral. He had some mild chest pain instead said he started shaking. There was no loss of consciousness and no bowel or bladder incontinence and noted that her tongue. He has a history of heart disease and history of prior stroke. On on my exam, there is a questionable left facial droop and no other focal findings. He had 1 generalized myoclonic jerk during my exam but states that the shaking had earlier was worse at night. He is noted to be lethargic and is speaking slowly and is slow to follow commands. He had been complaining of some numbness around his face and left hand and dry mouth which could be consistent with hyperventilation. He does not have a seizure history. He'll need to get a cardiac evaluation in light of his episode of chest pain. The alleged seizure seems more likely to be related to the stress of the moment.  Dione Booze, MD 01/26/12 1524  ECG shows normal sinus rhythm with a rate of 59, no ectopy. Normal axis. Normal P wave. Normal QRS. Normal intervals. Normal ST and T waves. Impression: normal ECG.  when compared with ECG of 12/30/2011, inferior T wave inversions have normalized.    Dione Booze, MD 01/26/12 1525

## 2012-01-26 NOTE — ED Notes (Signed)
Family at bedside. 

## 2012-01-26 NOTE — ED Notes (Signed)
Received pt via EMS with c/o while at funeral had seizure. Pt had 3 seizures while with EMS. Pt c/o headache and seeing spots, left side face numbness. Pt had seizure while in ED.

## 2012-01-26 NOTE — ED Notes (Signed)
Pt returns from ct, placed back on cardiac monitor, pulse ox, 02 2lt. Bassett.

## 2012-02-06 NOTE — ED Provider Notes (Signed)
I saw and evaluated the patient, reviewed the resident's note and I agree with the findings and plan.   Jennette Leask, MD 02/06/12 0102 

## 2012-02-12 ENCOUNTER — Telehealth (INDEPENDENT_AMBULATORY_CARE_PROVIDER_SITE_OTHER): Payer: Self-pay

## 2012-02-12 NOTE — Telephone Encounter (Signed)
I can refill when I am in the office tomorrow AM

## 2012-02-12 NOTE — Telephone Encounter (Signed)
The patient called requesting Percocet for his hernia pain.  He states he is ready to schedule surgery.  The last time he was examined and had a head ct, they said he could have surgery.  Please advise

## 2012-02-13 ENCOUNTER — Telehealth (INDEPENDENT_AMBULATORY_CARE_PROVIDER_SITE_OTHER): Payer: Self-pay | Admitting: General Surgery

## 2012-02-13 NOTE — Telephone Encounter (Signed)
Dr Janee Morn wants to talk to the patient.  He tried to call him today and will try again tomorrow.

## 2012-02-13 NOTE — Telephone Encounter (Signed)
Tried to call patient regarding his request to schedule surgery.  Message that he was not available (likely cell phone off).  I will try to call him tomorrow. Noted + cocaine on recent ED visit.

## 2012-02-14 ENCOUNTER — Telehealth (INDEPENDENT_AMBULATORY_CARE_PROVIDER_SITE_OTHER): Payer: Self-pay | Admitting: General Surgery

## 2012-02-14 MED ORDER — OXYCODONE-ACETAMINOPHEN 5-325 MG PO TABS: 1.0000 | ORAL_TABLET | ORAL | Status: DC | PRN

## 2012-02-14 NOTE — Telephone Encounter (Signed)
i spoke with the patient on the phone. He has been having a difficult time since his wife passed away earlier this month. I advised him his drug screen was positive for cocaine. He claims he is not abusing any drugs any longer. He is going to church and leaving a clean life. I advised him anesthesiology would need a clean drug screen in order to schedule surgery due to the risk to his heart that cocaine abuse can cause. He is agreeable to this. Therefore, I will refill his pain medication and we will schedule surgery.

## 2012-03-21 ENCOUNTER — Telehealth (INDEPENDENT_AMBULATORY_CARE_PROVIDER_SITE_OTHER): Payer: Self-pay | Admitting: General Surgery

## 2012-03-21 ENCOUNTER — Other Ambulatory Visit (INDEPENDENT_AMBULATORY_CARE_PROVIDER_SITE_OTHER): Payer: Self-pay

## 2012-03-21 DIAGNOSIS — R899 Unspecified abnormal finding in specimens from other organs, systems and tissues: Secondary | ICD-10-CM

## 2012-03-21 NOTE — Telephone Encounter (Signed)
Pt calling in to (1) inquire when surgery will be re-scheduled, and (2) request refill of pain meds.  Paged Dr. Janee Morn and updated him.  Obtained orders for Percocet refill (signed in office today by Dr. Jamey Ripa,) and for urine drug screen, to clear him for surgery.  Called pt back, to pick up Rx at the front desk and to instruct him to go to [facility] for drug screen.  He assures me he is not taking any other substances and will gladly to the drug screen.  He understands that after the drug screen results come back (clean) we will reschedule his surgery.  He will pick up the Rx and go for lab on Monday, 03/24/12.

## 2012-03-24 ENCOUNTER — Other Ambulatory Visit (INDEPENDENT_AMBULATORY_CARE_PROVIDER_SITE_OTHER): Payer: Self-pay | Admitting: General Surgery

## 2012-03-25 LAB — DRUG SCREEN, URINE
Amphetamine Screen, Ur: NEGATIVE
Benzodiazepines.: NEGATIVE
Marijuana Metabolite: NEGATIVE
Methadone: NEGATIVE
Opiates: NEGATIVE
Phencyclidine (PCP): NEGATIVE

## 2012-03-26 ENCOUNTER — Telehealth (INDEPENDENT_AMBULATORY_CARE_PROVIDER_SITE_OTHER): Payer: Self-pay | Admitting: General Surgery

## 2012-03-26 NOTE — Telephone Encounter (Signed)
Russell Carter CALLED TO CHECK ON URINE SCREENING HE HAD DONE ON Monday/03-24-12. CHECKING ON RESULTS. RESULTS WERE NEGATIVE. I TOLD Russell Carter THAT I WOULD SEND A MESSAGE TO DR. THOMPSON THAT HE IS READY TO SCHEDULE HERNIA SURGERY/GY/ PH- [phone] OR CELL [phone]/GY

## 2012-03-27 ENCOUNTER — Telehealth (INDEPENDENT_AMBULATORY_CARE_PROVIDER_SITE_OTHER): Payer: Self-pay

## 2012-03-27 ENCOUNTER — Other Ambulatory Visit: Payer: Self-pay | Admitting: General Surgery

## 2012-03-27 NOTE — Telephone Encounter (Signed)
I called the patient and told him we got orders to schedule his surgery.  He can expect a call in a day or 2.  I told him about stopping the Indomethacin and he has not been on it.  Posting sheet went to the schedulers

## 2012-03-27 NOTE — Telephone Encounter (Signed)
Message copied by Ivory Broad on Thu Mar 27, 2012  2:13 PM ------      Message from: Liz Malady      Created: Thu Mar 27, 2012  2:03 PM       Patient's drug screen was OK.  I put his orders in to schedule [facility] repair with mesh.  I noted on the orders he has to hold his indomethacin 5 days prior.  Will you fill out a white sheet: right inguinal hernia repair with mesh, 1.5 hours, no assist, [facility], outpatient. Elective.  Thx!

## 2012-03-31 ENCOUNTER — Telehealth (INDEPENDENT_AMBULATORY_CARE_PROVIDER_SITE_OTHER): Payer: Self-pay

## 2012-03-31 NOTE — Telephone Encounter (Signed)
Pt called to request another Percocet Rx.  He stated, "I threw it in the trash by mistake."  He also was calling to find out if his surgery had been scheduled.  Surgery scheduling did not have a working phone number for him.  He gave me [phone].  Please contact the patient.

## 2012-03-31 NOTE — Telephone Encounter (Signed)
The patient was last given Percocet on 5/31.  This will have to be addressed by Dr Janee Morn.  I will forward this to him.

## 2012-04-02 ENCOUNTER — Telehealth (INDEPENDENT_AMBULATORY_CARE_PROVIDER_SITE_OTHER): Payer: Self-pay

## 2012-04-02 NOTE — Telephone Encounter (Signed)
I called the pt and notified him that Dr Janee Morn should be coming by today late or in the morning to write his pain med prescription.  I will call him when it's ready

## 2012-04-03 ENCOUNTER — Other Ambulatory Visit (INDEPENDENT_AMBULATORY_CARE_PROVIDER_SITE_OTHER): Payer: Self-pay | Admitting: General Surgery

## 2012-04-03 MED ORDER — OXYCODONE-ACETAMINOPHEN 5-325 MG PO TABS: 1.0000 | ORAL_TABLET | ORAL | Status: DC | PRN

## 2012-04-21 ENCOUNTER — Telehealth: Payer: Self-pay | Admitting: Cardiovascular Disease

## 2012-04-21 ENCOUNTER — Telehealth (INDEPENDENT_AMBULATORY_CARE_PROVIDER_SITE_OTHER): Payer: Self-pay

## 2012-04-21 NOTE — Telephone Encounter (Signed)
Pt is calling for refill of pain meds.  He explained that his surgery has been delayed secondary to insurance issues, but he will have a new card by 04/25/12 to bring in and get surgery scheduled.  Paged Dr. Janee Morn and he okayed request to refill, but will need one of the surgeons in the office today to issue for him.   Dr. Donell Beers signed Rx for Percocet 5/325 mg, # 40, 1 po Q4h prn; message left on pt's voice mail to pick up Rx at front desk of [facility].

## 2012-04-21 NOTE — Telephone Encounter (Signed)
New Problem:     I called the patient and was unable to reach them. I left a message on their voicemail with my name, the reason I called, the name of his physician, and a number to call back to schedule their appointment. 

## 2012-05-05 ENCOUNTER — Telehealth (INDEPENDENT_AMBULATORY_CARE_PROVIDER_SITE_OTHER): Payer: Self-pay | Admitting: General Surgery

## 2012-05-05 ENCOUNTER — Other Ambulatory Visit (INDEPENDENT_AMBULATORY_CARE_PROVIDER_SITE_OTHER): Payer: Self-pay | Admitting: General Surgery

## 2012-05-05 MED ORDER — OXYCODONE-ACETAMINOPHEN 5-325 MG PO TABS: 1.0000 | ORAL_TABLET | ORAL | Status: DC | PRN

## 2012-05-05 NOTE — Telephone Encounter (Signed)
Dr Janee Morn ordered the percocet.  I called and let him know to pick up the prescription.   Russell Carter will be by tomorrow.

## 2012-05-05 NOTE — Telephone Encounter (Signed)
Pt calling to inform us that he would like surgery to correct his hernia as soon as possible; he brought his new insurance card to [facility] front desk.  Copies were made and he thought it was sent to the schedulers, but has not yet been contacted.  His cell phone only works for the first two weeks of the month, so please contact him on the home phone number (ok to leave a message.)  Also he needs pain meds (Percocet, currently) to bridge to surgery.  Paged Dr. Janee Morn for orders.

## 2012-05-06 ENCOUNTER — Encounter (HOSPITAL_BASED_OUTPATIENT_CLINIC_OR_DEPARTMENT_OTHER): Payer: Self-pay | Admitting: *Deleted

## 2012-05-06 NOTE — Progress Notes (Signed)
Pt wife died 4/13-he had hx cocaine abuse-relapsed Dr Janee Morn would not sch surg until pt had clear drug screen Pt denies doing anymore Dr Sampson Goon said to ck drug screen with b[facility]. To bring meds and arrive with son [place]

## 2012-05-08 ENCOUNTER — Encounter (HOSPITAL_BASED_OUTPATIENT_CLINIC_OR_DEPARTMENT_OTHER)
Admission: RE | Admit: 2012-05-08 | Discharge: 2012-05-08 | Disposition: A | Discharge status: Home / Self Care | Payer: Self-pay | Source: Ambulatory Visit | Attending: General Surgery | Admitting: General Surgery

## 2012-05-08 LAB — RAPID URINE DRUG SCREEN, HOSP PERFORMED
Amphetamines: NOT DETECTED
Benzodiazepines: NOT DETECTED
Opiates: NOT DETECTED
Tetrahydrocannabinol: NOT DETECTED

## 2012-05-08 LAB — BASIC METABOLIC PANEL
CO2: 25 mEq/L (ref 19–32)
Chloride: 105 mEq/L (ref 96–112)
Creatinine, Ser: 1.1 mg/dL (ref 0.50–1.35)
GFR calc Af Amer: 84 mL/min — ABNORMAL LOW (ref >90)
Potassium: 4.3 mEq/L (ref 3.5–5.1)

## 2012-05-09 NOTE — Progress Notes (Signed)
Spoke with Britta Mccreedy in nursing at Dr. Carollee Massed office - she will share that urine drug screen was negative.

## 2012-05-12 ENCOUNTER — Ambulatory Visit (HOSPITAL_BASED_OUTPATIENT_CLINIC_OR_DEPARTMENT_OTHER)
Admission: RE | Admit: 2012-05-12 | Discharge: 2012-05-13 | Disposition: A | Discharge status: Home / Self Care | Payer: Self-pay | Source: Ambulatory Visit | Attending: General Surgery | Admitting: General Surgery

## 2012-05-12 ENCOUNTER — Encounter (HOSPITAL_BASED_OUTPATIENT_CLINIC_OR_DEPARTMENT_OTHER): Payer: Self-pay | Admitting: Anesthesiology

## 2012-05-12 ENCOUNTER — Encounter (HOSPITAL_COMMUNITY)
Admission: RE | Disposition: A | Discharge status: Home / Self Care | Payer: Self-pay | Source: Ambulatory Visit | Attending: General Surgery

## 2012-05-12 ENCOUNTER — Ambulatory Visit (HOSPITAL_BASED_OUTPATIENT_CLINIC_OR_DEPARTMENT_OTHER): Payer: Self-pay | Admitting: Anesthesiology

## 2012-05-12 ENCOUNTER — Encounter (HOSPITAL_BASED_OUTPATIENT_CLINIC_OR_DEPARTMENT_OTHER): Payer: Self-pay

## 2012-05-12 DIAGNOSIS — I10 Essential (primary) hypertension: Secondary | ICD-10-CM | POA: Insufficient documentation

## 2012-05-12 DIAGNOSIS — I219 Acute myocardial infarction, unspecified: Secondary | ICD-10-CM

## 2012-05-12 DIAGNOSIS — Z01812 Encounter for preprocedural laboratory examination: Secondary | ICD-10-CM | POA: Insufficient documentation

## 2012-05-12 DIAGNOSIS — I252 Old myocardial infarction: Secondary | ICD-10-CM | POA: Insufficient documentation

## 2012-05-12 DIAGNOSIS — I441 Atrioventricular block, second degree: Secondary | ICD-10-CM | POA: Diagnosis present

## 2012-05-12 DIAGNOSIS — E7800 Pure hypercholesterolemia, unspecified: Secondary | ICD-10-CM

## 2012-05-12 DIAGNOSIS — I2510 Atherosclerotic heart disease of native coronary artery without angina pectoris: Secondary | ICD-10-CM | POA: Insufficient documentation

## 2012-05-12 DIAGNOSIS — K4090 Unilateral inguinal hernia, without obstruction or gangrene, not specified as recurrent: Secondary | ICD-10-CM

## 2012-05-12 DIAGNOSIS — Z8673 Personal history of transient ischemic attack (TIA), and cerebral infarction without residual deficits: Secondary | ICD-10-CM | POA: Insufficient documentation

## 2012-05-12 HISTORY — PX: INGUINAL HERNIA REPAIR: SHX194

## 2012-05-12 HISTORY — DX: Cocaine abuse, uncomplicated: F14.10

## 2012-05-12 LAB — POCT HEMOGLOBIN-HEMACUE: Hemoglobin: 15 g/dL (ref 13.0–17.0)

## 2012-05-12 LAB — CBC
HCT: 48.5 % (ref 39.0–52.0)
MCH: 30.6 pg (ref 26.0–34.0)
MCHC: 34 g/dL (ref 30.0–36.0)
MCV: 89.8 fL (ref 78.0–100.0)
Platelets: 189 10*3/uL (ref 150–400)
RDW: 13.9 % (ref 11.5–15.5)
WBC: 6.7 10*3/uL (ref 4.0–10.5)

## 2012-05-12 LAB — CREATININE, SERUM: GFR calc Af Amer: 82 mL/min — ABNORMAL LOW (ref >90)

## 2012-05-12 SURGERY — REPAIR, HERNIA, INGUINAL, ADULT
Anesthesia: General | Site: Groin | Laterality: Right | Wound class: Clean

## 2012-05-12 MED ORDER — ACETAMINOPHEN 10 MG/ML IV SOLN
1000.0000 mg | Freq: Once | INTRAVENOUS | Status: AC
  Administered 2012-05-12: 1000 mg via INTRAVENOUS

## 2012-05-12 MED ORDER — OXYCODONE-ACETAMINOPHEN 10-325 MG PO TABS: 1.0000 | ORAL_TABLET | Freq: Four times a day (QID) | ORAL | Status: DC | PRN

## 2012-05-12 MED ORDER — MIDAZOLAM HCL 2 MG/2ML IJ SOLN
1.0000 mg | INTRAMUSCULAR | Status: DC | PRN
  Administered 2012-05-12: 2 mg via INTRAVENOUS

## 2012-05-12 MED ORDER — OXYCODONE HCL 5 MG PO TABS: 5.0000 mg | ORAL_TABLET | Freq: Once | ORAL | Status: DC | PRN

## 2012-05-12 MED ORDER — BUPIVACAINE HCL (PF) 0.5 % IJ SOLN
INTRAMUSCULAR | Status: DC | PRN
  Administered 2012-05-12: 20 mL

## 2012-05-12 MED ORDER — PROPOFOL 10 MG/ML IV EMUL
INTRAVENOUS | Status: DC | PRN
  Administered 2012-05-12: 200 mg via INTRAVENOUS

## 2012-05-12 MED ORDER — HEPARIN SODIUM (PORCINE) 5000 UNIT/ML IJ SOLN
5000.0000 [IU] | Freq: Three times a day (TID) | INTRAMUSCULAR | Status: DC
  Filled 2012-05-12 (×2): qty 1

## 2012-05-12 MED ORDER — HYDROMORPHONE HCL PF 1 MG/ML IJ SOLN
0.5000 mg | INTRAMUSCULAR | Status: DC | PRN
Start: 2012-05-12 — End: 2012-05-13
  Administered 2012-05-13: 0.5 mg via INTRAVENOUS
  Filled 2012-05-12: qty 1

## 2012-05-12 MED ORDER — LISINOPRIL 40 MG PO TABS: 40.0000 mg | ORAL_TABLET | Freq: Every day | ORAL | Status: DC

## 2012-05-12 MED ORDER — METOCLOPRAMIDE HCL 5 MG/ML IJ SOLN: 10.0000 mg | Freq: Once | INTRAMUSCULAR | Status: DC | PRN

## 2012-05-12 MED ORDER — INDOMETHACIN 25 MG PO CAPS
25.0000 mg | ORAL_CAPSULE | Freq: Three times a day (TID) | ORAL | Status: DC
  Administered 2012-05-12 – 2012-05-13 (×3): 25 mg via ORAL
  Filled 2012-05-12 (×5): qty 1

## 2012-05-12 MED ORDER — MECLIZINE HCL 25 MG PO TABS: 25.0000 mg | ORAL_TABLET | Freq: Every day | ORAL | Status: DC | PRN

## 2012-05-12 MED ORDER — ATENOLOL 50 MG PO TABS: 50.0000 mg | ORAL_TABLET | Freq: Every day | ORAL | Status: DC

## 2012-05-12 MED ORDER — HYDROMORPHONE HCL PF 1 MG/ML IJ SOLN
0.2500 mg | INTRAMUSCULAR | Status: DC | PRN
  Administered 2012-05-12: 0.5 mg via INTRAVENOUS

## 2012-05-12 MED ORDER — HYDROCHLOROTHIAZIDE 25 MG PO TABS
25.0000 mg | ORAL_TABLET | Freq: Every day | ORAL | Status: DC
Start: 2012-05-13 — End: 2012-05-13
  Administered 2012-05-13: 25 mg via ORAL
  Filled 2012-05-12: qty 1

## 2012-05-12 MED ORDER — CEFAZOLIN SODIUM-DEXTROSE 2-3 GM-% IV SOLR
2.0000 g | INTRAVENOUS | Status: AC
  Administered 2012-05-12: 2 g via INTRAVENOUS

## 2012-05-12 MED ORDER — BUPIVACAINE-EPINEPHRINE 0.5% -1:200000 IJ SOLN
INTRAMUSCULAR | Status: DC | PRN
  Administered 2012-05-12: 20 mL

## 2012-05-12 MED ORDER — DEXAMETHASONE SODIUM PHOSPHATE 4 MG/ML IJ SOLN
INTRAMUSCULAR | Status: DC | PRN
  Administered 2012-05-12: 4 mg via INTRAVENOUS

## 2012-05-12 MED ORDER — OXYCODONE HCL 5 MG/5ML PO SOLN
5.0000 mg | Freq: Once | ORAL | Status: DC | PRN
Start: 2012-05-12 — End: 2012-05-12

## 2012-05-12 MED ORDER — ACETAMINOPHEN 650 MG RE SUPP: 650.0000 mg | Freq: Four times a day (QID) | RECTAL | Status: DC | PRN

## 2012-05-12 MED ORDER — ASPIRIN EC 325 MG PO TBEC
325.0000 mg | DELAYED_RELEASE_TABLET | Freq: Every day | ORAL | Status: DC
  Administered 2012-05-13: 325 mg via ORAL
  Filled 2012-05-12: qty 1

## 2012-05-12 MED ORDER — HEPARIN SODIUM (PORCINE) 5000 UNIT/ML IJ SOLN
5000.0000 [IU] | Freq: Three times a day (TID) | INTRAMUSCULAR | Status: DC
  Filled 2012-05-12 (×4): qty 1

## 2012-05-12 MED ORDER — LACTATED RINGERS IV SOLN
INTRAVENOUS | Status: DC
  Administered 2012-05-12 (×2): via INTRAVENOUS

## 2012-05-12 MED ORDER — LIDOCAINE HCL (CARDIAC) 20 MG/ML IV SOLN
INTRAVENOUS | Status: DC | PRN
  Administered 2012-05-12: 75 mg via INTRAVENOUS

## 2012-05-12 MED ORDER — NITROGLYCERIN 0.4 MG SL SUBL: 0.4000 mg | SUBLINGUAL_TABLET | SUBLINGUAL | Status: DC | PRN

## 2012-05-12 MED ORDER — ACETAMINOPHEN 325 MG PO TABS: 650.0000 mg | ORAL_TABLET | Freq: Four times a day (QID) | ORAL | Status: DC | PRN

## 2012-05-12 MED ORDER — FENTANYL CITRATE 0.05 MG/ML IJ SOLN
50.0000 ug | INTRAMUSCULAR | Status: DC | PRN
  Administered 2012-05-12: 100 ug via INTRAVENOUS

## 2012-05-12 MED ORDER — CYCLOBENZAPRINE HCL 10 MG PO TABS
10.0000 mg | ORAL_TABLET | Freq: Three times a day (TID) | ORAL | Status: DC | PRN
  Filled 2012-05-12: qty 1

## 2012-05-12 MED ORDER — POTASSIUM CHLORIDE IN NACL 20-0.45 MEQ/L-% IV SOLN
INTRAVENOUS | Status: DC
  Administered 2012-05-12: 16:00:00 via INTRAVENOUS
  Filled 2012-05-12 (×3): qty 1000

## 2012-05-12 MED ORDER — IBUPROFEN 200 MG PO TABS
200.0000 mg | ORAL_TABLET | Freq: Four times a day (QID) | ORAL | Status: DC | PRN
  Filled 2012-05-12: qty 1

## 2012-05-12 MED ORDER — KETOROLAC TROMETHAMINE 30 MG/ML IJ SOLN
INTRAMUSCULAR | Status: DC | PRN
  Administered 2012-05-12: 30 mg via INTRAVENOUS

## 2012-05-12 MED ORDER — DROPERIDOL 2.5 MG/ML IJ SOLN
INTRAMUSCULAR | Status: DC | PRN
  Administered 2012-05-12: 0.625 mg via INTRAVENOUS

## 2012-05-12 MED ORDER — LOSARTAN POTASSIUM 50 MG PO TABS
50.0000 mg | ORAL_TABLET | Freq: Every day | ORAL | Status: DC
  Administered 2012-05-12 – 2012-05-13 (×2): 50 mg via ORAL
  Filled 2012-05-12 (×2): qty 1

## 2012-05-12 MED ORDER — CHLORHEXIDINE GLUCONATE 4 % EX LIQD: 1.0000 "application " | Freq: Once | CUTANEOUS | Status: DC

## 2012-05-12 MED ORDER — OXYCODONE HCL 5 MG PO TABS
5.0000 mg | ORAL_TABLET | ORAL | Status: DC | PRN
  Administered 2012-05-12 – 2012-05-13 (×4): 5 mg via ORAL
  Filled 2012-05-12 (×4): qty 1

## 2012-05-12 MED ORDER — PANTOPRAZOLE SODIUM 40 MG IV SOLR
40.0000 mg | Freq: Every day | INTRAVENOUS | Status: DC
  Administered 2012-05-12: 40 mg via INTRAVENOUS
  Filled 2012-05-12 (×2): qty 40

## 2012-05-12 MED ORDER — ONDANSETRON HCL 4 MG/2ML IJ SOLN: 4.0000 mg | Freq: Four times a day (QID) | INTRAMUSCULAR | Status: DC | PRN

## 2012-05-12 SURGICAL SUPPLY — 48 items
ADH SKN CLS APL DERMABOND .7 (GAUZE/BANDAGES/DRESSINGS) ×1
BLADE HEX COATED 2.75 (ELECTRODE) ×2 IMPLANT
BLADE SURG 10 STRL SS (BLADE) ×2 IMPLANT
BLADE SURG 15 STRL LF DISP TIS (BLADE) ×1 IMPLANT
BLADE SURG 15 STRL SS (BLADE) ×2
BLADE SURG ROTATE 9660 (MISCELLANEOUS) ×1 IMPLANT
CANISTER SUCTION 1200CC (MISCELLANEOUS) ×2 IMPLANT
CHLORAPREP W/TINT 26ML (MISCELLANEOUS) ×2 IMPLANT
CLOTH BEACON ORANGE TIMEOUT ST (SAFETY) ×2 IMPLANT
COVER MAYO STAND STRL (DRAPES) ×2 IMPLANT
COVER TABLE BACK 60X90 (DRAPES) ×2 IMPLANT
DECANTER SPIKE VIAL GLASS SM (MISCELLANEOUS) ×1 IMPLANT
DERMABOND ADVANCED (GAUZE/BANDAGES/DRESSINGS) ×1
DERMABOND ADVANCED .7 DNX12 (GAUZE/BANDAGES/DRESSINGS) ×1 IMPLANT
DRAIN PENROSE 1/2X12 LTX STRL (WOUND CARE) ×2 IMPLANT
DRAPE PED LAPAROTOMY (DRAPES) ×2 IMPLANT
DRAPE UTILITY XL STRL (DRAPES) ×2 IMPLANT
ELECT REM PT RETURN 9FT ADLT (ELECTROSURGICAL) ×2
ELECTRODE REM PT RTRN 9FT ADLT (ELECTROSURGICAL) ×1 IMPLANT
GLOVE BIO SURGEON STRL SZ8 (GLOVE) ×2 IMPLANT
GLOVE BIOGEL PI IND STRL 8 (GLOVE) ×1 IMPLANT
GLOVE BIOGEL PI INDICATOR 8 (GLOVE) ×1
GLOVE ECLIPSE 6.5 STRL STRAW (GLOVE) ×1 IMPLANT
GOWN PREVENTION PLUS XLARGE (GOWN DISPOSABLE) ×2 IMPLANT
GOWN PREVENTION PLUS XXLARGE (GOWN DISPOSABLE) ×2 IMPLANT
MESH HERNIA 3X6 (Mesh General) ×1 IMPLANT
NDL HYPO 25X1 1.5 SAFETY (NEEDLE) ×1 IMPLANT
NEEDLE HYPO 25X1 1.5 SAFETY (NEEDLE) ×2 IMPLANT
NS IRRIG 1000ML POUR BTL (IV SOLUTION) ×1 IMPLANT
PACK BASIN DAY SURGERY FS (CUSTOM PROCEDURE TRAY) ×2 IMPLANT
PENCIL BUTTON HOLSTER BLD 10FT (ELECTRODE) ×2 IMPLANT
SLEEVE SCD COMPRESS KNEE MED (MISCELLANEOUS) IMPLANT
SPONGE LAP 18X18 X RAY DECT (DISPOSABLE) IMPLANT
SPONGE LAP 4X18 X RAY DECT (DISPOSABLE) ×2 IMPLANT
SUT MON AB 4-0 PC3 18 (SUTURE) ×2 IMPLANT
SUT PROLENE 0 CT 2 (SUTURE) ×6 IMPLANT
SUT VIC AB 2-0 SH 27 (SUTURE) ×6
SUT VIC AB 2-0 SH 27XBRD (SUTURE) ×2 IMPLANT
SUT VIC AB 3-0 SH 27 (SUTURE) ×2
SUT VIC AB 3-0 SH 27X BRD (SUTURE) ×1 IMPLANT
SUT VICRYL AB 2 0 TIE (SUTURE) IMPLANT
SUT VICRYL AB 2 0 TIES (SUTURE)
SYR CONTROL 10ML LL (SYRINGE) ×2 IMPLANT
TOWEL OR 17X24 6PK STRL BLUE (TOWEL DISPOSABLE) ×3 IMPLANT
TOWEL OR NON WOVEN STRL DISP B (DISPOSABLE) ×2 IMPLANT
TUBE CONNECTING 20X1/4 (TUBING) ×2 IMPLANT
WATER STERILE IRR 1000ML POUR (IV SOLUTION) ×1 IMPLANT
YANKAUER SUCT BULB TIP NO VENT (SUCTIONS) ×2 IMPLANT

## 2012-05-12 NOTE — H&P (Signed)
  HPI  Patient is well-known to me status post repair of recurrent left inguinal hernia with mesh in August of 2005. Since that time he has been followed by [facility]. He does have a history of hypertension as well as to what sound like TIA events in the past. 6 weeks ago he developed a bulge in his right groin. It is gradually been getting larger. It is been quite painful for the past week or so. The pain is exacerbated by heavy activity. He has not had a change in bowel or bladder habits.The mass seems to go away when he lays down.  Review of Systems  Constitutional: Negative for fever, chills and unexpected weight change.  HENT: Negative for hearing loss, congestion, sore throat, trouble swallowing and voice change.  Eyes: Negative for visual disturbance.  Respiratory: Negative for cough and wheezing.  Cardiovascular: Negative for chest pain, palpitations and leg swelling.  Gastrointestinal: Negative for nausea, vomiting, abdominal pain, diarrhea, constipation, blood in stool, abdominal distention, anal bleeding and rectal pain.  Genitourinary: Negative for hematuria and difficulty urinating.  Musculoskeletal: Negative for arthralgias.  Skin: Negative for rash and wound.  Neurological: Negative for seizures, syncope, weakness and headaches.  Hematological: Negative for adenopathy. Does not bruise/bleed easily.  Psychiatric/Behavioral: Negative for confusion.  Bulge with pain in right groin   Objective:   Physical Exam  Constitutional: He is oriented to person, place, and time. He appears well-developed and well-nourished. No distress.  HENT:  Head: Normocephalic and atraumatic.  Eyes: Conjunctivae and EOM are normal. Pupils are equal, round, and reactive to light.  Neck: Normal range of motion. Neck supple. No tracheal deviation present.  Cardiovascular: Normal rate, regular rhythm and normal heart sounds.  No murmur heard.  Pulmonary/Chest: Effort normal and breath sounds normal. No  respiratory distress. He has no wheezes. He has no rales.  Abdominal: Soft. He exhibits no distension. There is no tenderness. There is no rebound and no guarding.  Musculoskeletal: Normal range of motion.  Neurological: He is alert and oriented to person, place, and time.  Inguinal exam reveals large right inguinal hernia extending into the top of the scrotum. This is fully reducible with mild discomfort. Both testes are descended and not edematous. Left inguinal exam reveals his old hernia repair remains intact.   Assessment:    Large right inguinal hernia which is increasingly symptomatic   Plan:    Right inguinal hernia repair with mesh. Procedure is contents were discussed in detail with the patient. He will need to hold his aspirin and indomethacin 5 days prior to surgery. Also prescribed oxycodone/APAP for pain as needed. We look forward to scheduling this as an outpatient procedure in the near future. The patient is agreeable.     Update - patient cancelled previously due to positive drug screen.  We have been in frequent contact and he has avoided those activities.  UDS negative as part of pre-op labs.  Violeta Gelinas, MD, MPH, FACS Pager: [phone]

## 2012-05-12 NOTE — Interval H&P Note (Signed)
History and Physical Interval Note:  05/12/2012 9:39 AM  Russell Carter  has presented today for surgery, with the diagnosis of Right inguinal hernis  The various methods of treatment have been discussed with the patient and family. After consideration of risks, benefits and other options for treatment, the patient has consented to  Procedure(s) (LRB): HERNIA REPAIR INGUINAL ADULT (Right) INSERTION OF MESH (Right) as a surgical intervention .  The patient's history has been reviewed, patient re-examined, no change in status, stable for surgery.  I have reviewed the patient's chart and labs.  Questions were answered to the patient's satisfaction.    Patient also received TAP block by anesthesia this AM. Violeta Gelinas E

## 2012-05-12 NOTE — Anesthesia Preprocedure Evaluation (Addendum)
Anesthesia Evaluation  Patient identified by MRN, date of birth, ID band Patient awake    Reviewed: Allergy & Precautions, H&P , NPO status , Patient's Chart, lab work & pertinent test results, reviewed documented beta blocker date and time   Airway Mallampati: II TM Distance: >3 FB Neck ROM: full    Dental   Pulmonary pneumonia -, resolved,  breath sounds clear to auscultation        Cardiovascular hypertension, Pt. on medications and Pt. on home beta blockers + CAD and + Past MI + dysrhythmias Rhythm:regular Rate:Normal     Neuro/Psych  Headaches, PSYCHIATRIC DISORDERS Depression CVA, No Residual Symptoms negative psych ROS   GI/Hepatic negative GI ROS, Neg liver ROS,   Endo/Other    Renal/GU negative Renal ROS  negative genitourinary   Musculoskeletal   Abdominal   Peds  Hematology negative hematology ROS (+)   Anesthesia Other Findings See surgeon's H&P   Reproductive/Obstetrics negative OB ROS                          Anesthesia Physical Anesthesia Plan  ASA: III  Anesthesia Plan: General   Post-op Pain Management:    Induction: Intravenous  Airway Management Planned: LMA  Additional Equipment:   Intra-op Plan:   Post-operative Plan: Extubation in OR  Informed Consent: I have reviewed the patients History and Physical, chart, labs and discussed the procedure including the risks, benefits and alternatives for the proposed anesthesia with the patient or authorized representative who has indicated his/her understanding and acceptance.   Dental Advisory Given  Plan Discussed with: CRNA and Surgeon  Anesthesia Plan Comments:         Anesthesia Quick Evaluation

## 2012-05-12 NOTE — Progress Notes (Signed)
Patient had episodes of heart block in PACU.  Some prolonged PR intervals then dropped QRS but not classic Wenckebach.  I spoke to his daughter.  Will check EKG, troponin, and CXR.  I have arranged transfer to tele at [facility] and I spoke to [facility] cardiology to see him in consultation. Violeta Gelinas, MD, MPH, FACS Pager: [phone]

## 2012-05-12 NOTE — Transfer of Care (Signed)
Immediate Anesthesia Transfer of Care Note  Patient: Russell Carter  Procedure(s) Performed: Procedure(s) (LRB): HERNIA REPAIR INGUINAL ADULT (Right) INSERTION OF MESH (Right)  Patient Location: PACU  Anesthesia Type: General and Regional  Level of Consciousness: awake  Airway & Oxygen Therapy: Patient Spontanous Breathing and Patient connected to face mask oxygen  Post-op Assessment: Report given to PACU RN and Post -op Vital signs reviewed and stable  Post vital signs: Reviewed and stable  Complications: No apparent anesthesia complications

## 2012-05-12 NOTE — Consult Note (Signed)
CARDIOLOGY CONSULT NOTE    Patient ID: Russell Carter MRN: [id number] DOB/AGE: 57-Oct-1957 57 y.o.  Admit date: 05/12/2012 Referring Physician Violeta Gelinas MD Primary Physician [facility] Primary Cardiologist Charlton Haws MD Reason for Consultation Heart block  HPI: Russell Carter is a 56 year old black male that we are seeing at the request of Dr. Janee Morn for heart block. He has a history of hypertension, coronary disease, hypercholesterolemia, and TIA. He underwent right inguinal hernia repair today. In the postoperative period he was noted to have second-degree AV block type I with Wenkebach. His blood pressure dropped as low as 86 systolic. Blood pressure is now normal. Patient is asymptomatic. He was admitted to telemetry. He remains in normal sinus rhythm with Wenkebach block. Heart rate is in the upper 50s. He is asymptomatic. He denies any lightheadedness, dizziness, shortness of breath, chest pain, or weakness. In the past he has been on atenolol. More recently he states he is on carvedilol twice a day. He does not know the dose. He is also on lisinopril and HCTZ for blood pressure. He has a history of coronary disease with prior cardiac catheterization in 2011 showing a 50% stenosis in the mid LAD. 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 He is status post TIA in 2012. Prior Echo in 2011 was normal. He does complain of a severe hacking cough at night that he attributes to Lisinopril.  Review of systems complete and found to be negative unless listed above   Past Medical History  Diagnosis Date  . Hyperglycemia   . Gout, unspecified   . Heart block   . Hypercholesterolemia   . Chest pain   . Hyperlipidemia   . HTN (hypertension)   . Muscle spasm   . Diabetes mellitus   . Chest pain   . Abdominal pain   . Rt groin pain   . Blood in stool   . Migraines   . Stroke   . Arthritis   . Pneumonia   . Depression   . Myocardial infarction       drug cocaine and marijuana at time of mi none  since    . Cocaine abuse     history    Family History  Problem Relation Age of Onset  . Heart attack Mother     x7. Still alive  . Heart attack Father     deceased b/c of MI  . Heart attack Brother     older, deceased b/c of MI     History   Social History  . Marital Status: Married    Spouse Name: N/A    Number of Children: N/A  . Years of Education: N/A   Occupational History  . Not on file.   Social History Main Topics  . Smoking status: Never Smoker   . Smokeless tobacco: Never Used  . Alcohol Use: 4.2 oz/week    7 Cans of beer per week     occasional beer  . Drug Use: Yes    Special: "Crack" cocaine, Marijuana     occasional marijuana   . Sexually Active: Not on file   Other Topics Concern  . Not on file   Social History Narrative   Lives in [place] with his wife and son.     Past Surgical History  Procedure Date  . Reapir of left inguinal hernia 02/02/2003    Dr. Violeta Gelinas. repair with mesh. Same procedure done again in  05/22/2004   .  Rotator cuff repair 2010    left   . Cardiac catheterization 2008    Hardin-40%lad  . Reconstruct / stabilize distal ulna      Prescriptions prior to admission  Medication Sig Dispense Refill  . aspirin EC 325 MG tablet Take 325 mg by mouth daily.        Marland Kitchen CALCIUM-VITAMIN D PO Take 1 tablet by mouth daily.      . carvedilol (COREG) 6.25 MG tablet Take 6.25 mg by mouth 2 (two) times daily with a meal.      . cyclobenzaprine (FLEXERIL) 10 MG tablet Take 10 mg by mouth 3 (three) times daily as needed. For muscle spasms      . hydrochlorothiazide (HYDRODIURIL) 25 MG tablet Take 25 mg by mouth daily.       Marland Kitchen HYDROcodone-acetaminophen (NORCO) 5-325 MG per tablet Take 1 tablet by mouth every 4 (four) hours as needed. For pain.      Marland Kitchen ibuprofen (ADVIL,MOTRIN) 200 MG tablet Take 400 mg by mouth every 6 (six) hours as needed. For pain      . indomethacin (INDOCIN) 25 MG  capsule Take 25 mg by mouth 3 (three) times daily with meals.       . Iron-Vitamins (GERITOL PO) Take 2 tablets by mouth daily.      Marland Kitchen lisinopril (PRINIVIL,ZESTRIL) 40 MG tablet Take 40 mg by mouth daily.      Marland Kitchen oxyCODONE-acetaminophen (PERCOCET) 5-325 MG per tablet Take 1 tablet by mouth every 4 (four) hours as needed. For pain.  40 tablet  0  . pravastatin (PRAVACHOL) 40 MG tablet Take 40 mg by mouth daily.      . nitroGLYCERIN (NITROSTAT) 0.4 MG SL tablet Place 0.4 mg under the tongue every 5 (five) minutes x 3 doses as needed. For chest pain        Physical Exam: Blood pressure 145/88, pulse 62, temperature 97.5 F (36.4 C), temperature source Oral, resp. rate 16, height 5\' 11"  (1.803 m), weight 99.791 kg (220 lb), SpO2 98.00%.  Patient is a pleasant white male in no acute distress.The patient is alert and oriented x 3.  The mood and affect are normal.  The skin is warm and dry.  Color is normal.  The HEENT exam reveals that the sclera are nonicteric.  The mucous membranes are moist.  The carotids are 2+ without bruits.  There is no thyromegaly.  There is no JVD.  The lungs are clear.  The chest wall is non tender.  The heart exam reveals a regular rate with a normal S1 and S2.  There are no murmurs, gallops, or rubs.  The PMI is not displaced.   Abdominal exam reveals good bowel sounds.  There is no guarding or rebound. He has a surgical scar and dressing in his right inguinal region. There is no hepatosplenomegaly or tenderness.  There are no masses.  Exam of the legs reveal no clubbing, cyanosis, or edema.  The legs are without rashes.  The distal pulses are intact.  Cranial nerves II - XII are intact.  Motor and sensory functions are intact.  The gait is normal.  Labs:   Lab Results  Component Value Date   WBC 6.0 01/26/2012   HGB 15.0 05/12/2012   HCT 43.7 01/26/2012   MCV 84.7 01/26/2012   PLT 223 01/26/2012    Lab 05/08/12 1600  NA 140  K 4.3  CL 105  CO2 25  BUN 12  CREATININE 1.10  CALCIUM 9.7  PROT --  BILITOT --  ALKPHOS --  ALT --  AST --  GLUCOSE 101*       Radiology: Chest x-ray is pending EKG: ECG is pending  ASSESSMENT AND PLAN:  1. 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 He is also on beta blocker therapy. He is asymptomatic.  2. History of nonobstructive coronary disease. Subendocardial myocardial infarction in the past related to cocaine abuse. Currently he is not using.  3. Hypertension. Patient's blood pressure has been controlled. We will need to hold his beta blocker therapy because of his AV block. He also reports significant cough  related to lisinopril. We will switch him to Cozaar.  4. Status post right inguinal hernia repair.  5. History of TIA.  Plan: We will hold  beta blocker therapy. Switch lisinopril to Cozaar. Will monitor overnight on telemetry. If stable I would anticipate discharge in the morning. We will followup on results of his lab tests including blood work, chest x-ray, and ECG.  SignedTheron Arista [facility] 05/12/2012, 3:21 PM

## 2012-05-12 NOTE — Anesthesia Postprocedure Evaluation (Addendum)
  Anesthesia Post-op Note  Patient: Russell Carter  Procedure(s) Performed: Procedure(s) (LRB): HERNIA REPAIR INGUINAL ADULT (Right) INSERTION OF MESH (Right)  Patient Location: PACU  Anesthesia Type: GA combined with regional for post-op pain  Level of Consciousness: sedated  Airway and Oxygen Therapy: Patient connected to nasal cannula oxygen  Post-op Pain: none  Post-op Assessment: Post-op Vital signs reviewed, PATIENT'S CARDIOVASCULAR STATUS UNSTABLE, Respiratory Function Stable, Patent Airway, No signs of Nausea or vomiting, Adequate PO intake and Pain level controlled  Post-op Vital Signs: Reviewed and unstable  Complications: No apparent anesthesia complications, unexpected post-op hospitalization and cardiovascular complications  Patient had a reoccurrence of his heart block while in PACU. Admission arranged for F/U by Dr. Janee Morn. CFrederick, MD

## 2012-05-12 NOTE — Op Note (Signed)
05/12/2012  10:56 AM  PATIENT:  Russell Carter  56 y.o. male  PRE-OPERATIVE DIAGNOSIS:  Right inguinal hernis  POST-OPERATIVE DIAGNOSIS:  Right inguinal hernis  PROCEDURE:  Procedure(s): HERNIA REPAIR INGUINAL ADULT INSERTION OF MESH  SURGEON:  Surgeon(s): Liz Malady, MD  PHYSICIAN ASSISTANT:   ASSISTANTS: none   ANESTHESIA:   local and general  EBL:  Total I/O In: 1200 [I.V.:1200] Out: -   BLOOD ADMINISTERED:none  DRAINS: none   SPECIMEN:  No Specimen  DISPOSITION OF SPECIMEN:  N/A  COUNTS:  YES  DICTATION: .Dragon DictationPatient presents for right inguinal hernia repair with mesh. He underwent TAP block by anesthesia. He was identified in the preop holding area. His iste was marked.  Informed consent was obtained. He received intravenous antibiotics. He was brought to the operating room and general anesthesia was administered by the anesthesia staff. Time out procedure was done.Local anesthetic was injected in the right groin along the planned line of incision. Right groin incision was made. Subcutaneous tissues were dissected down through Scarpa's fascia revealing the external oblique fascia. This was divided out laterally. The division was continued medially to the external ring. Superior leaflet was dissected free off the transversalis and the inferior leaflet was dissected down revealing the shelving edge of inguinal ligament. Cord structures were encircled with a Penrose drain. Dissection of the cord revealed no indirect hernia. There was a small cord lipoma that was excised. It was a very large direct inguinal hernia. This was dissected free from the cord structures. It easily reduced. It was kept in reduction by placing 2 interrupted figure-of-eight 2-0 Vicryl sutures from the shelving edge of inguinal ligament up to the transversalis. This allowed for completion of the hernia repair. This was done Monday polypropylene mesh cut to custom size and a keyhole fashion.  This was tacked to the tissues over the pubic tubercle medially with 0 Prolene suture. Was tacked in a running fashion to the shelving edge of inguinal ligament inferiorly. Superiorly, interrupted 0 point sutures were used to begin tack it to the tissues over the pubic tubercle. It was then tacked along the transversalis extending laterally. The 2 leaflets of the mesh were rejoined behind the cord structures and tacked together and down to the underlying musculature. The aperture in the mesh admitted the tip of the fifth digit. Cord structures remained viable and nonedematous. Area was copiously irrigated. Hemostasis was ensured. External oblique fascia was closed with running 2-0 Vicryl. Scarpa's fascia was reapproximated with interrupted 3-0 Vicryl. Skin was closed with running 4-0 Monocryl subcuticular stitch, Dermabond. Sponge, needles, and instrument counts were all correct. Patient tolerated procedure well without apparent complication was taken recovery in stable condition. His right testicle was relocated to anatomic position in the scrotum at the completion of the procedure.  PATIENT DISPOSITION:  PACU - hemodynamically stable.   Delay start of Pharmacological VTE agent (>24hrs) due to surgical blood loss or risk of bleeding:  no  Violeta Gelinas, MD, MPH, FACS Pager: [phone]  7/22/201310:56 AM

## 2012-05-12 NOTE — Progress Notes (Signed)
Assisted Dr. Frederick with right, ultrasound guided, transabdominal plane block. Side rails up, monitors on throughout procedure. See vital signs in flow sheet. Tolerated Procedure well. 

## 2012-05-12 NOTE — Anesthesia Procedure Notes (Addendum)
Anesthesia Regional Block:  TAP block  Pre-Anesthetic Checklist: ,, timeout performed, Correct Patient, Correct Site, Correct Laterality, Correct Procedure, Correct Position, site marked, Risks and benefits discussed,  Surgical consent,  Pre-op evaluation,  At surgeon's request and post-op pain management  Laterality: Right  Prep: chloraprep       Needles:  Injection technique: Single-shot  Needle Type: Echogenic Needle     Needle Length: 9cm  Needle Gauge: 21    Additional Needles:  Procedures: ultrasound guided TAP block Narrative:  Start time: 05/12/2012 9:00 AM End time: 05/12/2012 9:08 AM Injection made incrementally with aspirations every 5 mL.  Performed by: Personally  Anesthesiologist: Aldona Lento, MD  Additional Notes: Ultrasound guidance used to: id relevant anatomy, confirm needle position, local anesthetic spread, avoidance of vascular puncture. Picture saved. No complications. Block performed personally by Janetta Hora. Gelene Mink, MD    TAP block Procedure Name: LMA Insertion Date/Time: 05/12/2012 9:48 AM Performed by: Zenia Resides D Pre-anesthesia Checklist: Patient identified, Emergency Drugs available, Suction available, Patient being monitored and Timeout performed Patient Re-evaluated:Patient Re-evaluated prior to inductionOxygen Delivery Method: Circle System Utilized Preoxygenation: Pre-oxygenation with 100% oxygen Intubation Type: IV induction Ventilation: Mask ventilation without difficulty LMA: LMA inserted LMA Size: 4.0 Number of attempts: 1 Airway Equipment and Method: bite block Placement Confirmation: positive ETCO2 Tube secured with: Tape Dental Injury: Teeth and Oropharynx as per pre-operative assessment

## 2012-05-12 NOTE — Progress Notes (Signed)
Pt experiencing heart block with drop in blood pressure. Pt remained arouseable and oriented. Dr Gelene Mink and Dr. Janee Morn notified and at bedside. Pt to be transferred to [facility] telemetry bed for further evaluation.

## 2012-05-13 ENCOUNTER — Encounter (HOSPITAL_BASED_OUTPATIENT_CLINIC_OR_DEPARTMENT_OTHER): Payer: Self-pay | Admitting: General Surgery

## 2012-05-13 ENCOUNTER — Ambulatory Visit (HOSPITAL_COMMUNITY): Payer: Self-pay

## 2012-05-13 DIAGNOSIS — I459 Conduction disorder, unspecified: Secondary | ICD-10-CM

## 2012-05-13 LAB — BASIC METABOLIC PANEL
BUN: 15 mg/dL (ref 6–23)
CO2: 24 mEq/L (ref 19–32)
Calcium: 8.8 mg/dL (ref 8.4–10.5)
Creatinine, Ser: 1.16 mg/dL (ref 0.50–1.35)
Glucose, Bld: 97 mg/dL (ref 70–99)

## 2012-05-13 MED ORDER — HYDROCHLOROTHIAZIDE 25 MG PO TABS: 25.0000 mg | ORAL_TABLET | Freq: Every day | ORAL | Status: DC

## 2012-05-13 MED ORDER — HEPARIN SODIUM (PORCINE) 5000 UNIT/ML IJ SOLN
5000.0000 [IU] | Freq: Three times a day (TID) | INTRAMUSCULAR | Status: DC
  Administered 2012-05-13: 5000 [IU] via SUBCUTANEOUS
  Filled 2012-05-13 (×3): qty 1

## 2012-05-13 MED ORDER — PRAVASTATIN SODIUM 40 MG PO TABS: 40.0000 mg | ORAL_TABLET | Freq: Every day | ORAL | Status: DC

## 2012-05-13 MED ORDER — OXYCODONE-ACETAMINOPHEN 10-325 MG PO TABS: 1.0000 | ORAL_TABLET | Freq: Four times a day (QID) | ORAL | Status: DC | PRN

## 2012-05-13 MED ORDER — LOSARTAN POTASSIUM 50 MG PO TABS: 50.0000 mg | ORAL_TABLET | Freq: Every day | ORAL | Status: DC

## 2012-05-13 MED ORDER — ASPIRIN 325 MG PO TBEC: 325.0000 mg | DELAYED_RELEASE_TABLET | Freq: Every day | ORAL | Status: AC

## 2012-05-13 MED ORDER — CYCLOBENZAPRINE HCL 10 MG PO TABS: 10.0000 mg | ORAL_TABLET | Freq: Three times a day (TID) | ORAL | Status: AC | PRN

## 2012-05-13 NOTE — Discharge Summary (Signed)
Physician Discharge Summary  Patient ID: Russell Carter MRN: [id number] DOB/AGE: 05/20/1956 56 y.o.  Admit date: 05/12/2012 Discharge date: 05/13/2012  Admission Diagnoses:S/P RIH repair with mesh, Mobitz type 1 heart block with Wenckebach  Discharge Diagnoses: S/P RIH repair with mesh, Mobitz type 1 heart block with Wenckebach Active Problems:  Mobitz type 1 second degree AV block   Discharged Condition: good  Hospital Course: Patient underwent RIH repair with mesh at CDS 7/22.  Post-op he had intermittent heart block Mobitz type 1 with Wenkebach.  He was transferred to [facility] telemetry.  No acute MI.  Cardiology consultation was done and medications were adjusted.  Cardiology cleared him for D/C today.  He has remained afebrile and HD stable today. He tolerated diet and has good pain control.  Consults: cardiology  Significant Diagnostic Studies: CXR, EKG, labs  Treatments: surgery as above  Discharge Exam: Blood pressure 130/82, pulse 65, temperature 98.2 F (36.8 C), temperature source Oral, resp. rate 13, height 5\' 11"  (1.803 m), weight 103.874 kg (229 lb), SpO2 97.00%. see my progress note from today  Disposition: 01-Home or Self Care  Discharge Orders    Future Appointments: Provider: Department: Dept Phone: Center:   05/28/2012 8:30 AM Wendall Stade, MD [facility] [phone] [facility]     Future Orders Please Complete By Expires   Diet - low sodium heart healthy      Diet - low sodium heart healthy      Increase activity slowly      Discharge instructions      Comments:   [facility] _______[facility]  UMBILICAL OR INGUINAL HERNIA REPAIR: POST OP INSTRUCTIONS  Always review your discharge instruction sheet given to you by the facility where your surgery was performed. IF YOU HAVE DISABILITY OR FAMILY LEAVE FORMS, YOU MUST BRING THEM TO THE OFFICE FOR PROCESSING.   DO NOT GIVE THEM TO YOUR DOCTOR.  A  prescription for pain medication  may be given to you upon discharge.  Take your pain medication as prescribed, if needed.  If narcotic pain medicine is not needed, then you may take acetaminophen (Tylenol) or ibuprofen (Advil) as needed. Take your usually prescribed medications unless otherwise directed. If you need a refill on your pain medication, please contact your pharmacy.  They will contact our office to request authorization. Prescriptions will not be filled after 5 pm or on week-ends. You should follow a light diet the first 24 hours after arrival home, such as soup and crackers, etc.  Be sure to include lots of fluids daily.  Resume your normal diet the day after surgery. Most patients will experience some swelling and bruising around the umbilicus or in the groin and scrotum.  Ice packs and reclining will help.  Swelling and bruising can take several days to resolve.  It is common to experience some constipation if taking pain medication after surgery.  Increasing fluid intake and taking a stool softener (such as Colace) will usually help or prevent this problem from occurring.  A mild laxative (Milk of Magnesia or Miralax) should be taken according to package directions if there are no bowel movements after 48 hours. Unless discharge instructions indicate otherwise, you may remove your bandages 24-48 hours after surgery, and you may shower at that time.  You may have steri-strips (small skin tapes) in place directly over the incision.  These strips should be left on the skin for 7-10 days.  If your surgeon used skin glue on the  incision, you may shower in 24 hours.  The glue will flake off over the next 2-3 weeks.  Any sutures or staples will be removed at the office during your follow-up visit. ACTIVITIES:  You may resume regular (light) daily activities beginning the next day-such as daily self-care, walking, climbing stairs-gradually increasing activities as tolerated.  You may have sexual intercourse when it is comfortable.   Refrain from any heavy lifting or straining until approved by your doctor. You may drive when you are no longer taking prescription pain medication, you can comfortably wear a seatbelt, and you can safely maneuver your car and apply brakes. RETURN TO WORK:  __________________________________________________________ Bonita Quin should see your doctor in the office for a follow-up appointment approximately 2-3 weeks after your surgery.  Make sure that you call for this appointment within a day or two after you arrive home to insure a convenient appointment time. OTHER INSTRUCTIONS:  __________________________________________________________________________________________________________________________________________________________________________________________  WHEN TO CALL YOUR DOCTOR: Fever over 101.0 Inability to urinate Nausea and/or vomiting Extreme swelling or bruising Continued bleeding from incision. Increased pain, redness, or drainage from the incision  The clinic staff is available to answer your questions during regular business hours.  Please don't hesitate to call and ask to speak to one of the nurses for clinical concerns.  If you have a medical emergency, go to the nearest emergency room or call 911.  A surgeon from [facility] is always on call at the hospital   [address], [place], [place]  [address] ?  [address] [address], [place], [place]   [address] [phone] ? [phone] ? FAX [phone] Web site: [email or web address]   Lifting restrictions      Comments:   No lifting over 10lbs for six weeks   Increase activity slowly      Discharge instructions      Comments:   [facility] _______[facility]  UMBILICAL OR INGUINAL HERNIA REPAIR: POST OP INSTRUCTIONS  Always review your discharge instruction sheet given to you by the facility where your surgery was performed. IF YOU HAVE DISABILITY OR FAMILY LEAVE FORMS, YOU MUST  BRING THEM TO THE OFFICE FOR PROCESSING.   DO NOT GIVE THEM TO YOUR DOCTOR.  A  prescription for pain medication may be given to you upon discharge.  Take your pain medication as prescribed, if needed.  If narcotic pain medicine is not needed, then you may take acetaminophen (Tylenol) or ibuprofen (Advil) as needed. Take your usually prescribed medications unless otherwise directed. If you need a refill on your pain medication, please contact your pharmacy.  They will contact our office to request authorization. Prescriptions will not be filled after 5 pm or on week-ends. You should follow a light diet the first 24 hours after arrival home, such as soup and crackers, etc.  Be sure to include lots of fluids daily.  Resume your normal diet the day after surgery. Most patients will experience some swelling and bruising around the umbilicus or in the groin and scrotum.  Ice packs and reclining will help.  Swelling and bruising can take several days to resolve.  It is common to experience some constipation if taking pain medication after surgery.  Increasing fluid intake and taking a stool softener (such as Colace) will usually help or prevent this problem from occurring.  A mild laxative (Milk of Magnesia or Miralax) should be taken according to package directions if there are no bowel movements after 48 hours. Unless discharge instructions  indicate otherwise, you may remove your bandages 24-48 hours after surgery, and you may shower at that time.  You may have steri-strips (small skin tapes) in place directly over the incision.  These strips should be left on the skin for 7-10 days.  If your surgeon used skin glue on the incision, you may shower in 24 hours.  The glue will flake off over the next 2-3 weeks.  Any sutures or staples will be removed at the office during your follow-up visit. ACTIVITIES:  You may resume regular (light) daily activities beginning the next day-such as daily self-care, walking,  climbing stairs-gradually increasing activities as tolerated.  You may have sexual intercourse when it is comfortable.  Refrain from any heavy lifting or straining until approved by your doctor. You may drive when you are no longer taking prescription pain medication, you can comfortably wear a seatbelt, and you can safely maneuver your car and apply brakes. RETURN TO WORK:  __________________________________________________________ Bonita Quin should see your doctor in the office for a follow-up appointment approximately 2-3 weeks after your surgery.  Make sure that you call for this appointment within a day or two after you arrive home to insure a convenient appointment time. OTHER INSTRUCTIONS:  __________________________________________________________________________________________________________________________________________________________________________________________  WHEN TO CALL YOUR DOCTOR: Fever over 101.0 Inability to urinate Nausea and/or vomiting Extreme swelling or bruising Continued bleeding from incision. Increased pain, redness, or drainage from the incision  The clinic staff is available to answer your questions during regular business hours.  Please don't hesitate to call and ask to speak to one of the nurses for clinical concerns.  If you have a medical emergency, go to the nearest emergency room or call 911.  A surgeon from [facility] is always on call at the hospital   [address], [place], [place]  [address] ?  [address] [address], [place], [place]   [address] [phone] ? [phone] ? FAX [phone] Web site: [email or web address]   Lifting restrictions      Comments:   No lifting over 10lbs for 6 weeks     Medication List  As of 05/13/2012 12:48 PM   STOP taking these medications         carvedilol 6.25 MG tablet      nitroGLYCERIN 0.4 MG SL tablet      oxyCODONE-acetaminophen 5-325 MG per tablet         TAKE these  medications         aspirin EC 325 MG tablet   Take 325 mg by mouth daily.      aspirin 325 MG EC tablet   Take 1 tablet (325 mg total) by mouth daily.      CALCIUM-VITAMIN D PO   Take 1 tablet by mouth daily.      cyclobenzaprine 10 MG tablet   Commonly known as: FLEXERIL   Take 10 mg by mouth 3 (three) times daily as needed. For muscle spasms      cyclobenzaprine 10 MG tablet   Commonly known as: FLEXERIL   Take 1 tablet (10 mg total) by mouth 3 (three) times daily as needed for muscle spasms.      GERITOL PO   Take 2 tablets by mouth daily.      hydrochlorothiazide 25 MG tablet   Commonly known as: HYDRODIURIL   Take 25 mg by mouth daily.      hydrochlorothiazide 25 MG tablet   Commonly known as: HYDRODIURIL   Take 1 tablet (  25 mg total) by mouth daily.      HYDROcodone-acetaminophen 5-325 MG per tablet   Commonly known as: NORCO/VICODIN   Take 1 tablet by mouth every 4 (four) hours as needed. For pain.      indomethacin 25 MG capsule   Commonly known as: INDOCIN   Take 25 mg by mouth 3 (three) times daily with meals.      lisinopril 40 MG tablet   Commonly known as: PRINIVIL,ZESTRIL   Take 40 mg by mouth daily.      losartan 50 MG tablet   Commonly known as: COZAAR   Take 1 tablet (50 mg total) by mouth daily.      oxyCODONE-acetaminophen 10-325 MG per tablet   Commonly known as: PERCOCET   Take 1-2 tablets by mouth every 6 (six) hours as needed for pain.      oxyCODONE-acetaminophen 10-325 MG per tablet   Commonly known as: PERCOCET   Take 1-2 tablets by mouth every 6 (six) hours as needed for pain.      pravastatin 40 MG tablet   Commonly known as: PRAVACHOL   Take 1 tablet (40 mg total) by mouth daily.         ASK your doctor about these medications         ibuprofen 200 MG tablet   Commonly known as: ADVIL,MOTRIN   Take 400 mg by mouth every 6 (six) hours as needed. For pain           Follow-up Information    Follow up with Charlton Haws,  MD on 05/26/2012.   Contact information:   [address] [place] [address] [phone]       Follow up with Liz Malady, MD. Schedule an appointment as soon as possible for a visit in 3 weeks.   Contact information:   [facility] [address] [place] [address] [phone]          Signed: Liz Malady 05/13/2012, 12:48 PM

## 2012-05-13 NOTE — Progress Notes (Signed)
1 Day Post-Op  Subjective: Sore but tolerating PO well, tele review shows very few dropped heart beats overnight  Objective: Vital signs in last 24 hours: Temp:  [97.5 F (36.4 C)-98.5 F (36.9 C)] 98.2 F (36.8 C) (07/23 0800) Pulse Rate:  [45-91] 65  (07/23 0800) Resp:  [9-20] 13  (07/23 0800) BP: (86-154)/(47-98) 130/82 mmHg (07/23 0800) SpO2:  [89 %-100 %] 97 % (07/23 0800) FiO2 (%):  [5 %] 5 % (07/22 0850) Weight:  [103.874 kg (229 lb)] 103.874 kg (229 lb) (07/22 1500) Last BM Date: 05/12/12  Intake/Output from previous day: 07/22 0701 - 07/23 0700 In: 1350 [P.O.:150; I.V.:1200] Out: 575 [Urine:575] Intake/Output this shift:    General appearance: alert and cooperative Resp: clear to auscultation bilaterally Cardio: regular rate and rhythm GI: soft, NT R groin incision CDI, minimal edema  Lab Results:   Basename 05/12/12 1613 05/12/12 0856  WBC 6.7 --  HGB 16.5 15.0  HCT 48.5 --  PLT 189 --   BMET  Basename 05/13/12 0608 05/12/12 1613  NA 138 --  K 3.8 --  CL 104 --  CO2 24 --  GLUCOSE 97 --  BUN 15 --  CREATININE 1.16 1.13  CALCIUM 8.8 --   PT/INR No results found for this basename: LABPROT:2,INR:2 in the last 72 hours ABG No results found for this basename: PHART:2,PCO2:2,PO2:2,HCO3:2 in the last 72 hours  Studies/Results: Dg Chest Port 1 View  05/13/2012  *RADIOLOGY REPORT*  Clinical Data: Postop (cardiac procedure)  PORTABLE CHEST - 1 VIEW  Comparison: 01/26/2012; 12/30/2011; 12/17/2011  Findings: Grossly unchanged cardiac silhouette and mediastinal contours.  Decreased lung volumes.  Mild cephalization of flow without frank evidence of pulmonary edema.  Linear heterogeneous opacities in the right upper and left mid lung favored to represent subsegmental atelectasis.  There is persistent mild elevation of the right hemidiaphragm.  No definite pneumothorax or pleural effusion.  Grossly unchanged bones with degenerative change of the bilateral AC  joints.  IMPRESSION: Decreased lung volumes and pulmonary venous congestion without frank evidence of pulmonary edema.  Original Report Authenticated By: Waynard Reeds, M.D.    Anti-infectives: Anti-infectives     Start     Dose/Rate Route Frequency Ordered Stop   05/12/12 0838   ceFAZolin (ANCEF) IVPB 2 g/50 mL premix        2 g 100 mL/hr over 30 Minutes Intravenous 60 min pre-op 05/12/12 0839 05/12/12 0938          Assessment/Plan: s/p Procedure(s) (LRB): HERNIA REPAIR INGUINAL ADULT (Right) INSERTION OF MESH (Right) S/P RIH Mobitz type 1/Wenckebach - improved on tele overnight, appreciate [facility] cardiology F/U, hopefully stable for D/C today FEN - lytes OK D/C home this AM if OK with cardiology  LOS: 1 day    Mariellen Blaney E 05/13/2012

## 2012-05-13 NOTE — Progress Notes (Signed)
ELECTROPHYSIOLOGY ROUNDING NOTE    Patient Name: Russell Carter Date of Encounter: 05-13-2012    SUBJECTIVE:No chest pain or shortness of breath.  Denies dizziness, syncope, pre-syncope, palpitations.  Pt with Mobitz I heart block status post inguinal hernia repair. Yesterday, Coreg was held for bradycardia and Lisinopril was switched to Cozaar due to cough.  TELEMETRY: Reviewed telemetry pt in sinus rhythm with occasional Mobitz I, no prolonged pauses Filed Vitals:   05/12/12 2100 05/13/12 0000 05/13/12 0400 05/13/12 0800  BP: 116/77 131/82 132/87 130/82  Pulse: 62 67 65 65  Temp: 98.4 F (36.9 C) 98.5 F (36.9 C) 98.4 F (36.9 C) 98.2 F (36.8 C)  TempSrc: Oral Oral Oral Oral  Resp: 18 18 18 13   Height:      Weight:      SpO2: 99% 99% 99% 97%    Intake/Output Summary (Last 24 hours) at 05/13/12 0810 Last data filed at 05/13/12 0500  Gross per 24 hour  Intake   1350 ml  Output    575 ml  Net    775 ml    LABS: Basic Metabolic Panel:  Basename 05/13/12 0608 05/12/12 1613  NA 138 --  K 3.8 --  CL 104 --  CO2 24 --  GLUCOSE 97 --  BUN 15 --  CREATININE 1.16 1.13  CALCIUM 8.8 --  MG -- --  PHOS -- --   CBC:  Basename 05/12/12 1613 05/12/12 0856  WBC 6.7 --  NEUTROABS -- --  HGB 16.5 15.0  HCT 48.5 --  MCV 89.8 --  PLT 189 --   Cardiac Enzymes:  Basename 05/12/12 1614  CKTOTAL --  CKMB --  CKMBINDEX --  TROPONINI <0.30   Radiology/Studies: Dg Chest Port 1 View 05/13/2012  *RADIOLOGY REPORT*  Clinical Data: Postop (cardiac procedure)  PORTABLE CHEST - 1 VIEW  Comparison: 01/26/2012; 12/30/2011; 12/17/2011  Findings: Grossly unchanged cardiac silhouette and mediastinal contours.  Decreased lung volumes.  Mild cephalization of flow without frank evidence of pulmonary edema.  Linear heterogeneous opacities in the right upper and left mid lung favored to represent subsegmental atelectasis.  There is persistent mild elevation of the right hemidiaphragm.  No  definite pneumothorax or pleural effusion.  Grossly unchanged bones with degenerative change of the bilateral AC joints.  IMPRESSION: Decreased lung volumes and pulmonary venous congestion without frank evidence of pulmonary edema.  Original Report Authenticated By: Waynard Reeds, M.D.   Physical Exam: Filed Vitals:   05/12/12 2100 05/13/12 0000 05/13/12 0400 05/13/12 0800  BP: 116/77 131/82 132/87 130/82  Pulse: 62 67 65 65  Temp: 98.4 F (36.9 C) 98.5 F (36.9 C) 98.4 F (36.9 C) 98.2 F (36.8 C)  TempSrc: Oral Oral Oral Oral  Resp: 18 18 18 13   Height:      Weight:      SpO2: 99% 99% 99% 97%    GEN- The patient is well appearing, alert and oriented x 3 today.   Head- normocephalic, atraumatic Eyes-  Sclera clear, conjunctiva pink Ears- hearing intact Oropharynx- clear Neck- supple, no JVP Lymph- no cervical lymphadenopathy Lungs- Clear to ausculation bilaterally, normal work of breathing Heart- Regular rate and rhythm, no murmurs, rubs or gallops, PMI not laterally displaced GI- soft, NT, ND, + BS Extremities- no clubbing, cyanosis, or edema  Assessment and Plan:  1. Mobitz I second degree AV block- asymptomatic Should improve post op No further workup planned Coreg can be restarted upon follow-up with Dr Eden Emms  2.  CAD- no ischemic symptoms  3. HTN- stable Hold coreg at discharge Continue cozaar (lisinopril stopped due to cough)  OK to discharge without further inpatient cardiology workup. Follow-up with Dr Eden Emms as scheduled in 2 weeks.  Fayrene Fearing Alany Borman,MD

## 2012-05-13 NOTE — Progress Notes (Signed)
Pts assessment unchanged from this am. His son at bedside and all discharge meds/instructions reviewed and understood. No further questions, d/c'd via wheelchair to private vehicle

## 2012-05-15 ENCOUNTER — Telehealth (INDEPENDENT_AMBULATORY_CARE_PROVIDER_SITE_OTHER): Payer: Self-pay

## 2012-05-15 NOTE — Telephone Encounter (Signed)
Pt called stating his percocet 10mg  was not completely taking pain away. I advised pt that no narcotic given out pt may take all of his discomfort and soreness away. Pt advised to use ice packs and to add  2 advil three times a day  in between the time he is taking his narcotic med.. Pt to call if this does not help his discomfort.

## 2012-05-20 ENCOUNTER — Ambulatory Visit (INDEPENDENT_AMBULATORY_CARE_PROVIDER_SITE_OTHER): Payer: PRIVATE HEALTH INSURANCE | Admitting: Surgery

## 2012-05-20 ENCOUNTER — Encounter (INDEPENDENT_AMBULATORY_CARE_PROVIDER_SITE_OTHER): Payer: Self-pay | Admitting: Surgery

## 2012-05-20 VITALS — BP 148/100 | HR 68 | Temp 98.4°F | Resp 18 | Ht 71.5 in | Wt 190.0 lb

## 2012-05-20 DIAGNOSIS — Z09 Encounter for follow-up examination after completed treatment for conditions other than malignant neoplasm: Secondary | ICD-10-CM

## 2012-05-20 MED ORDER — OXYCODONE-ACETAMINOPHEN 10-325 MG PO TABS: 1.0000 | ORAL_TABLET | Freq: Four times a day (QID) | ORAL | Status: AC | PRN

## 2012-05-20 NOTE — Patient Instructions (Signed)
Leave the Steri-Strips on. Do not shower or get incision wet until Saturday. Keep your appointment as scheduled with Dr. Janee Morn. Call if you have any other problems.

## 2012-05-20 NOTE — Progress Notes (Signed)
NAME: Russell Carter                                            DOB: 08-15-1956 DATE: 05/20/2012                                                  MRN: [id number]  CC: Post op   HPI: This patient comes in for post op follow-up.Heunderwent Right inguinal hernia repair on 05/13/2012 by Dr. Janee Morn.He comes to the urgent office today because the wound has opened up somewhat. He continues to have incisional pain and is almost out of his pain medication  PE:  VITAL SIGNS: BP 148/100  Pulse 68  Temp 98.4 F (36.9 C) (Temporal)  Resp 18  Ht 5' 11.5" (1.816 m)  Wt 190 lb (86.183 kg)  BMI 26.13 kg/m2  General: The patient appears to be healthy, NAD The incision is clean but had superficial separation of the dermis but not any deeper. Is no evidence of infection.  DATA REVIEWED: I reviewed electronic medical record and op note  IMPRESSION: The patient is doing well S/P Hernia repair, with mild superficial skin separation of the incision.    PLAN: The wound is taped closed with Steri-Strips and benzoin. I gave him prescription for an additional 20 Percocet 10/325. He will followup as scheduled with Dr. Janee Morn

## 2012-05-23 ENCOUNTER — Telehealth (INDEPENDENT_AMBULATORY_CARE_PROVIDER_SITE_OTHER): Payer: Self-pay

## 2012-05-23 NOTE — Telephone Encounter (Signed)
Pt is calling to request more Percocet.  States he saw Dr. Jamey Ripa on 7/30, but is now out of pain medicine. He has requested more than 20 pills since he will be traveling out of town on vacation.

## 2012-05-23 NOTE — Telephone Encounter (Signed)
Patient aware RX at the front for pick up.

## 2012-05-23 NOTE — Telephone Encounter (Signed)
Will give him rx for thirty Percocet 5/325. Any more need to come from Dr Janee Morn

## 2012-05-28 ENCOUNTER — Ambulatory Visit (INDEPENDENT_AMBULATORY_CARE_PROVIDER_SITE_OTHER): Payer: Self-pay | Admitting: Cardiovascular Disease

## 2012-05-28 ENCOUNTER — Encounter: Payer: Self-pay | Admitting: Cardiovascular Disease

## 2012-05-28 ENCOUNTER — Telehealth (INDEPENDENT_AMBULATORY_CARE_PROVIDER_SITE_OTHER): Payer: Self-pay | Admitting: General Surgery

## 2012-05-28 ENCOUNTER — Telehealth (INDEPENDENT_AMBULATORY_CARE_PROVIDER_SITE_OTHER): Payer: Self-pay

## 2012-05-28 VITALS — BP 128/82 | HR 72 | Ht 71.0 in | Wt 190.0 lb

## 2012-05-28 DIAGNOSIS — I441 Atrioventricular block, second degree: Secondary | ICD-10-CM

## 2012-05-28 DIAGNOSIS — I219 Acute myocardial infarction, unspecified: Secondary | ICD-10-CM

## 2012-05-28 DIAGNOSIS — I10 Essential (primary) hypertension: Secondary | ICD-10-CM

## 2012-05-28 DIAGNOSIS — E7800 Pure hypercholesterolemia, unspecified: Secondary | ICD-10-CM

## 2012-05-28 NOTE — Telephone Encounter (Signed)
Pt called in to ask for more pain medication.  He says that the percocet 5/325 is not working.  Asked the patient if he is still using ice compresses and the Advil in between the percocet like he was instructed.  He said he was.  Informed the pt that this needs to be given to our urgent office Dr. And that it will also be sent to Dr. Butch Penny nurse.

## 2012-05-28 NOTE — Telephone Encounter (Signed)
Dr Biagio Quint reviewed refill for narcotic request. Per Dr Biagio Quint pt needs to be seen if percocet is not helping pain 2 weeks post op from hernia repair. Pt should make appt to see Dr Janee Morn for urgent office. Could not reach pt by phone. LMOM for pt to call and ask for triage to review this msg.

## 2012-05-28 NOTE — Assessment & Plan Note (Signed)
Resolved off beta blocker.  HR in good range

## 2012-05-28 NOTE — Addendum Note (Signed)
Addended by: Scherrie Bateman E on: 05/28/2012 09:43 AM   Modules accepted: Orders

## 2012-05-28 NOTE — Assessment & Plan Note (Signed)
Stable with no angina and good activity level.  Continue medical Rx  

## 2012-05-28 NOTE — Patient Instructions (Addendum)
Your physician wants you to follow-up in:   6 MONTHS WITH DR Haywood Filler will receive a reminder letter in the mail two months in advance. If you don't receive a letter, please call our office to schedule the follow-up appointment. Your physician recommends that you continue on your current medications as directed. Please refer to the Current Medication list given to you today. TRY MELATONIN OVER THE COUNTER TAKE AS DIRECTED  You have been referred to  HEALTH SERVE CLOSING  [facility]  WILL SEE NEEDS F/U PER DR Eden Emms

## 2012-05-28 NOTE — Progress Notes (Signed)
Patient ID: Russell Carter, male   DOB: 1956/08/31, 56 y.o.   MRN: [id number] Russell Carter is a 56 year old black male  He has a history of hypertension, coronary disease, hypercholesterolemia, and TIA. He underwent right inguinal hernia repair 2 weeks ago . In the postoperative period he was noted to have second-degree AV block type I with Wenkebach. His blood pressure dropped as low as 86 systolic. Blood pressure is now normal. Patient is asymptomatic. He was admitted to telemetry. He remained in normal sinus rhythm with Wenkebach block. Heart rate is in the upper 50s. He is asymptomatic. He denies any lightheadedness, dizziness, shortness of breath, chest pain, or weakness. In the past he has been on atenolol. More recently he states he is on carvedilol twice a day. He does not know the dose. He is also on lisinopril and HCTZ for blood pressure. He has a history of coronary disease with prior cardiac catheterization in 2011 showing a 50% stenosis in the mid LAD. 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 He is status post TIA in 2012. Prior Echo in 2011 was normal. He does complain of a severe hacking cough at night that he attributes to Lisinopril. Meds were adjusted and he was D/C with no further issues  ACE changed to ARB and beta blocker D/C  ROS: Denies fever, malais, weight loss, blurry vision, decreased visual acuity, cough, sputum, SOB, hemoptysis, pleuritic pain, palpitaitons, heartburn, abdominal pain, melena, lower extremity edema, claudication, or rash.  All other systems reviewed and negative  General: Affect appropriate Healthy:  appears stated age HEENT: normal Neck supple with no adenopathy JVP normal no bruits no thyromegaly Lungs clear with no wheezing and good diaphragmatic motion Heart:  S1/S2 no murmur, no rub, gallop or click PMI normal Abdomen: benighn, BS positve, no tenderness, no AAA recent hernia surgery healing well no bruit.  No HSM  or HJR Distal pulses intact with no bruits No edema Neuro non-focal Skin warm and dry No muscular weakness   Current Outpatient Prescriptions  Medication Sig Dispense Refill  . aspirin EC 325 MG tablet Take 325 mg by mouth daily.        Marland Kitchen CALCIUM-VITAMIN D PO Take 1 tablet by mouth daily.      . cyclobenzaprine (FLEXERIL) 10 MG tablet Take 10 mg by mouth 3 (three) times daily as needed. For muscle spasms      . hydrochlorothiazide (HYDRODIURIL) 25 MG tablet Take 25 mg by mouth daily.       Marland Kitchen HYDROcodone-acetaminophen (NORCO) 5-325 MG per tablet Take 1 tablet by mouth every 4 (four) hours as needed. For pain.      Marland Kitchen ibuprofen (ADVIL,MOTRIN) 200 MG tablet Take 400 mg by mouth every 6 (six) hours as needed. For pain      . indomethacin (INDOCIN) 25 MG capsule Take 25 mg by mouth 3 (three) times daily with meals.       . Iron-Vitamins (GERITOL PO) Take 2 tablets by mouth daily.      Marland Kitchen losartan (COZAAR) 50 MG tablet Take 1 tablet (50 mg total) by mouth daily.  30 tablet  2  . oxyCODONE-acetaminophen (PERCOCET) 10-325 MG per tablet Take 1-2 tablets by mouth every 6 (six) hours as needed for pain.  20 tablet  0  . pravastatin (PRAVACHOL) 40 MG tablet Take 1 tablet (40 mg total) by mouth daily.  30 tablet  2  . DISCONTD: famotidine (PEPCID) 20 MG tablet Take 1  tablet (20 mg total) by mouth 2 (two) times daily.  30 tablet  0  . DISCONTD: hydrochlorothiazide (HYDRODIURIL) 25 MG tablet Take 1 tablet (25 mg total) by mouth daily.      Marland Kitchen DISCONTD: TACRINE HYDROCHLORIDE PO Take 25 mg by mouth daily.          Allergies  Shellfish allergy  Electrocardiogram:  Assessment and Plan

## 2012-05-28 NOTE — Assessment & Plan Note (Signed)
Cholesterol is at goal.  Continue current dose of statin and diet Rx.  No myalgias or side effects.  F/U  LFT's in 6 months. Lab Results  Component Value Date   LDLCALC  Value: 157        Total Cholesterol/HDL:CHD Risk Coronary Heart Disease Risk Table                     Men   Women  1/2 Average Risk   3.4   3.3  Average Risk       5.0   4.4  2 X Average Risk   9.6   7.1  3 X Average Risk  23.4   11.0        Use the calculated Patient Ratio above and the CHD Risk Table to determine the patient's CHD Risk.        ATP III CLASSIFICATION (LDL):  <100     mg/dL   Optimal  100-129  mg/dL   Near or Above                    Optimal  130-159  mg/dL   Borderline  160-189  mg/dL   High  >190     mg/dL   Very High* 01/22/2011             

## 2012-05-28 NOTE — Assessment & Plan Note (Signed)
Well controlled.  Continue current medications and low sodium Dash type diet.    

## 2012-06-04 ENCOUNTER — Encounter (INDEPENDENT_AMBULATORY_CARE_PROVIDER_SITE_OTHER): Payer: Self-pay | Admitting: General Surgery

## 2012-06-04 ENCOUNTER — Ambulatory Visit (INDEPENDENT_AMBULATORY_CARE_PROVIDER_SITE_OTHER): Payer: PRIVATE HEALTH INSURANCE | Admitting: General Surgery

## 2012-06-04 VITALS — BP 144/82 | HR 74 | Temp 97.2°F | Resp 18 | Ht 71.5 in | Wt 191.0 lb

## 2012-06-04 DIAGNOSIS — Z9889 Other specified postprocedural states: Secondary | ICD-10-CM

## 2012-06-04 DIAGNOSIS — Z8719 Personal history of other diseases of the digestive system: Secondary | ICD-10-CM

## 2012-06-04 MED ORDER — OXYCODONE-ACETAMINOPHEN 10-325 MG PO TABS: 1.0000 | ORAL_TABLET | Freq: Four times a day (QID) | ORAL | Status: DC | PRN

## 2012-06-04 NOTE — Progress Notes (Signed)
Subjective:     Patient ID: Russell Carter, male   DOB: May 21, 1956, 56 y.o.   MRN: [id number]  HPI  Patient is 3 weeks status post right inguinal hernia repair with mesh. He is doing well. He request refill of his pain medication but feels he can gradually taper off this. He would like to go back to work with light duty. Review of Systems     Objective:   Physical Exam Incision is intact. Hernia repair is intact. No significant testicular or scrotal edema. No signs of infection.    Assessment:     Doing well status post right inguinal hernia with mesh.    Plan:     Return to work with light duty starting tomorrow and back full duty starting September 2. I refilled his Percocet which he will gradually wean off. Followup when necessary

## 2012-06-17 ENCOUNTER — Telehealth (INDEPENDENT_AMBULATORY_CARE_PROVIDER_SITE_OTHER): Payer: Self-pay | Admitting: General Surgery

## 2012-06-17 NOTE — Telephone Encounter (Signed)
Pt called to request pain meds.  He has had hernia surgery and now is returned to work.  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  Will call in the Hydrocodone 5/325 mg, # 30, 1-2 po Q 4-6 H prn pain, no refill to [facility]:  [phone].

## 2012-06-18 ENCOUNTER — Telehealth: Payer: Self-pay | Admitting: Cardiovascular Disease

## 2012-06-18 NOTE — Telephone Encounter (Signed)
Patient request return call to discuss medication.

## 2012-06-18 NOTE — Telephone Encounter (Signed)
Dr nishan--micheal calling stating he gets his meds for gout and flexeril from [facility] and now they are closed-- he has been put on waiting list to see someone, but does not have an appoint yet and needs these meds to be prescribed, as he is on his feet all nite at work --would you please prescribe, until he gets another doctor ?  Flexeril 10mg  1 tab po TID Indocin 25mg  1 cap po TID

## 2012-06-19 NOTE — Telephone Encounter (Signed)
Ok to fill indocin and flexeril

## 2012-06-19 NOTE — Telephone Encounter (Signed)
F/u   Pt calling for status on the matter, he can be reached at [phone]

## 2012-06-20 NOTE — Telephone Encounter (Signed)
rx for indocin and flexeril called to [facility] per [place] dr Eden Emms

## 2012-06-27 ENCOUNTER — Telehealth (INDEPENDENT_AMBULATORY_CARE_PROVIDER_SITE_OTHER): Payer: Self-pay | Admitting: General Surgery

## 2012-06-27 NOTE — Telephone Encounter (Signed)
PT CALLED EARLIER TODAY REQUESTING PAIN MEDICATION REFILL/ HYDROCODONE LAST FILLED 06-17-12.I REVIEWED THIS WITH DR. Kathie Rhodes. GROSS IN DR. THOMPSON'S ABSENCE. DR. GROSS SAID IT WAS OK TO REILL HYDROCODONE 5/325 THIS TIME, BUT IF PT REQUESTS ANY MORE REFILLS HE WILL NEED TO SEE DR. B. THOMPSON FIRST/ PT AWARE/ REFILL CALLED TO [facility] [address]/ [phone]//GY

## 2012-08-06 ENCOUNTER — Telehealth: Payer: Self-pay | Admitting: Cardiovascular Disease

## 2012-08-06 MED ORDER — COLCHICINE 0.6 MG PO TABS: 0.6000 mg | ORAL_TABLET | Freq: Every day | ORAL | Status: DC

## 2012-08-06 MED ORDER — PREDNISONE 10 MG PO TABS: 10.0000 mg | ORAL_TABLET | ORAL | Status: DC

## 2012-08-06 NOTE — Telephone Encounter (Signed)
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

## 2012-08-06 NOTE — Telephone Encounter (Signed)
DISCUSSED WITH DR Eden Emms PT MAY TAKE  COLCHICINE 0.6 MG BID  AND TAPER DOSE OF PREDNISONE  SEE MED LIST  . PT  AWARE./CY

## 2012-08-12 ENCOUNTER — Other Ambulatory Visit: Payer: Self-pay | Admitting: *Deleted

## 2012-08-12 ENCOUNTER — Telehealth: Payer: Self-pay | Admitting: Cardiovascular Disease

## 2012-08-12 ENCOUNTER — Other Ambulatory Visit (INDEPENDENT_AMBULATORY_CARE_PROVIDER_SITE_OTHER): Payer: Self-pay

## 2012-08-12 DIAGNOSIS — M109 Gout, unspecified: Secondary | ICD-10-CM

## 2012-08-12 LAB — BASIC METABOLIC PANEL
CO2: 23 mEq/L (ref 19–32)
Calcium: 9 mg/dL (ref 8.4–10.5)
Creatinine, Ser: 1.1 mg/dL (ref 0.4–1.5)
Glucose, Bld: 81 mg/dL (ref 70–99)

## 2012-08-12 MED ORDER — ALLOPURINOL 300 MG PO TABS: 300.0000 mg | ORAL_TABLET | Freq: Every day | ORAL | Status: DC

## 2012-08-12 NOTE — Telephone Encounter (Signed)
Uric acid elevated Call in allopurinol 300 mg daily

## 2012-08-12 NOTE — Telephone Encounter (Signed)
PT HAD BMET AND URIC ACID DRAWN THIS AM  WILL FORWARD NOTE TO LET  MD KNOW DID NOT  GET COLCHICINE FILLED  WAS TO EXPENSIVE DID PICK UP THE PREDNISONE  TAPER DOSE SCRIPT./CY

## 2012-08-12 NOTE — Telephone Encounter (Signed)
NEW MESSAGE:  Pt came in for labwork and stated that he was unable to get the prescription that was called in due to the cost.  He got prednisone but not the other one.

## 2012-08-12 NOTE — Telephone Encounter (Signed)
PT AWARE OF LAB RESULTS 

## 2012-09-06 IMAGING — CR DG KNEE COMPLETE 4+V*L*
4 series · 4 of 4 positions shown · non-contrast
Comparison: None.

CLINICAL DATA: Knee pain and swelling.

LEFT KNEE - COMPLETE 4+ VIEW

[t knee ap left]
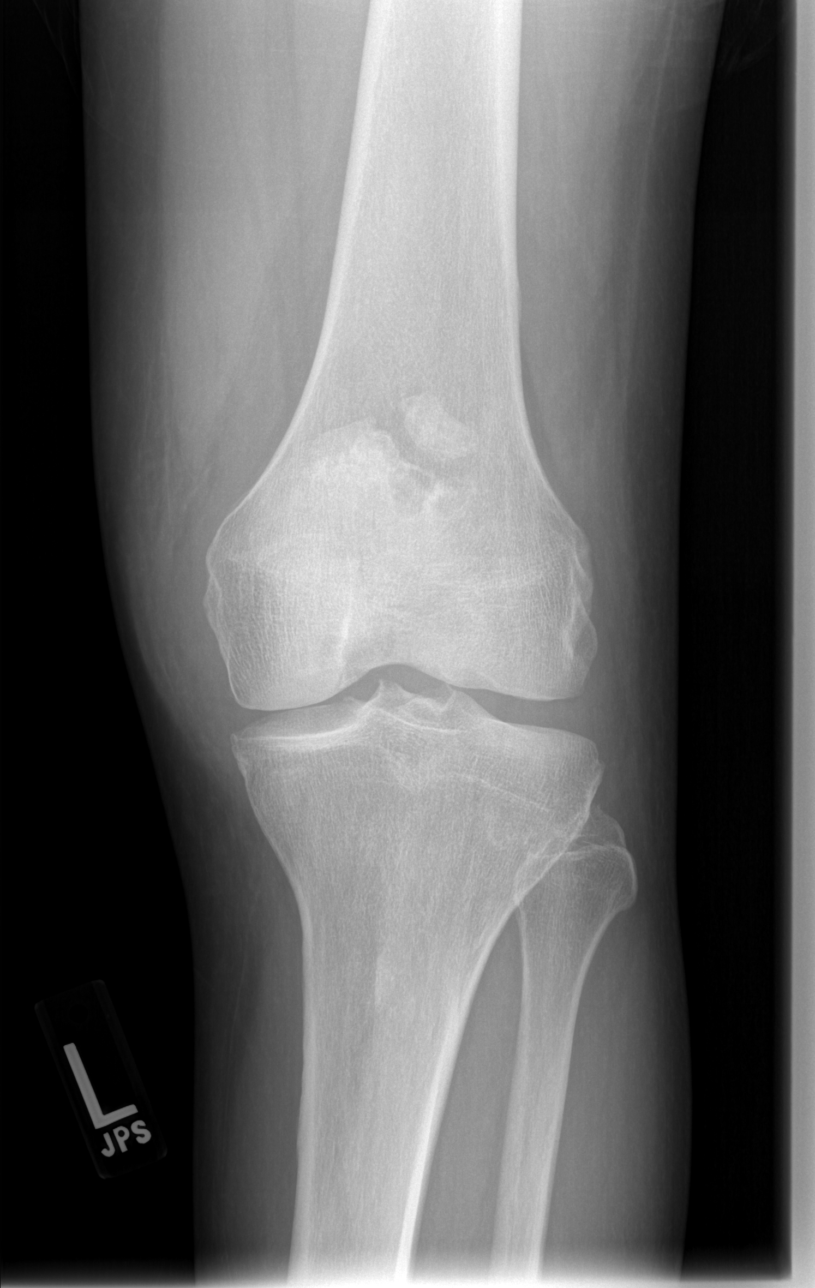

[t knee oblique left (1 of 2)]
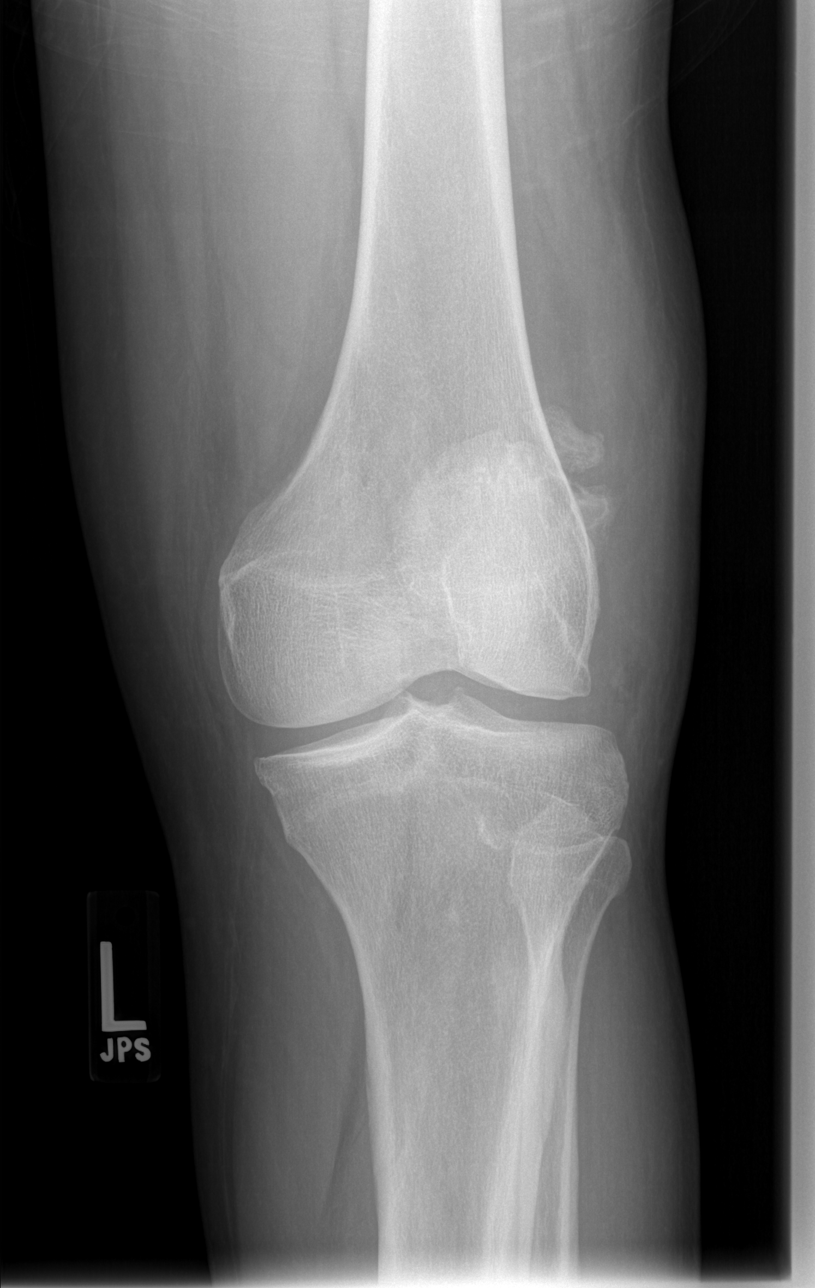

[t knee oblique left (2 of 2)]
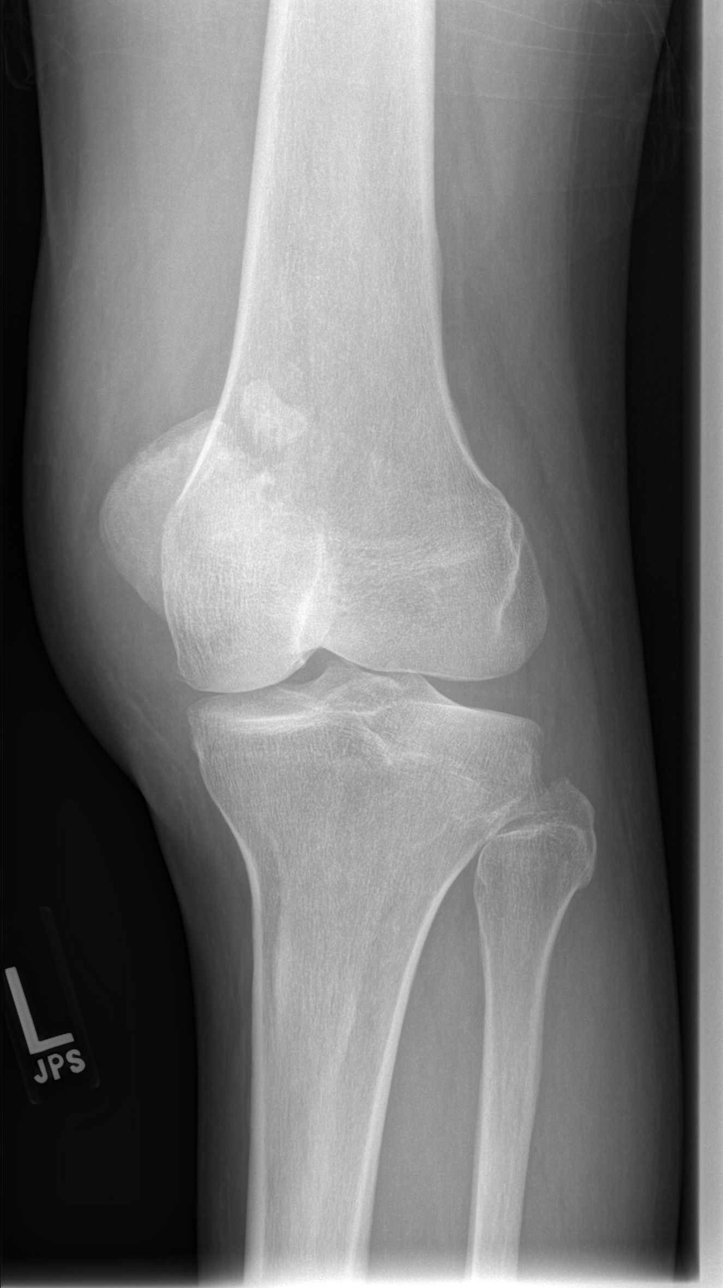

[t knee lat left]
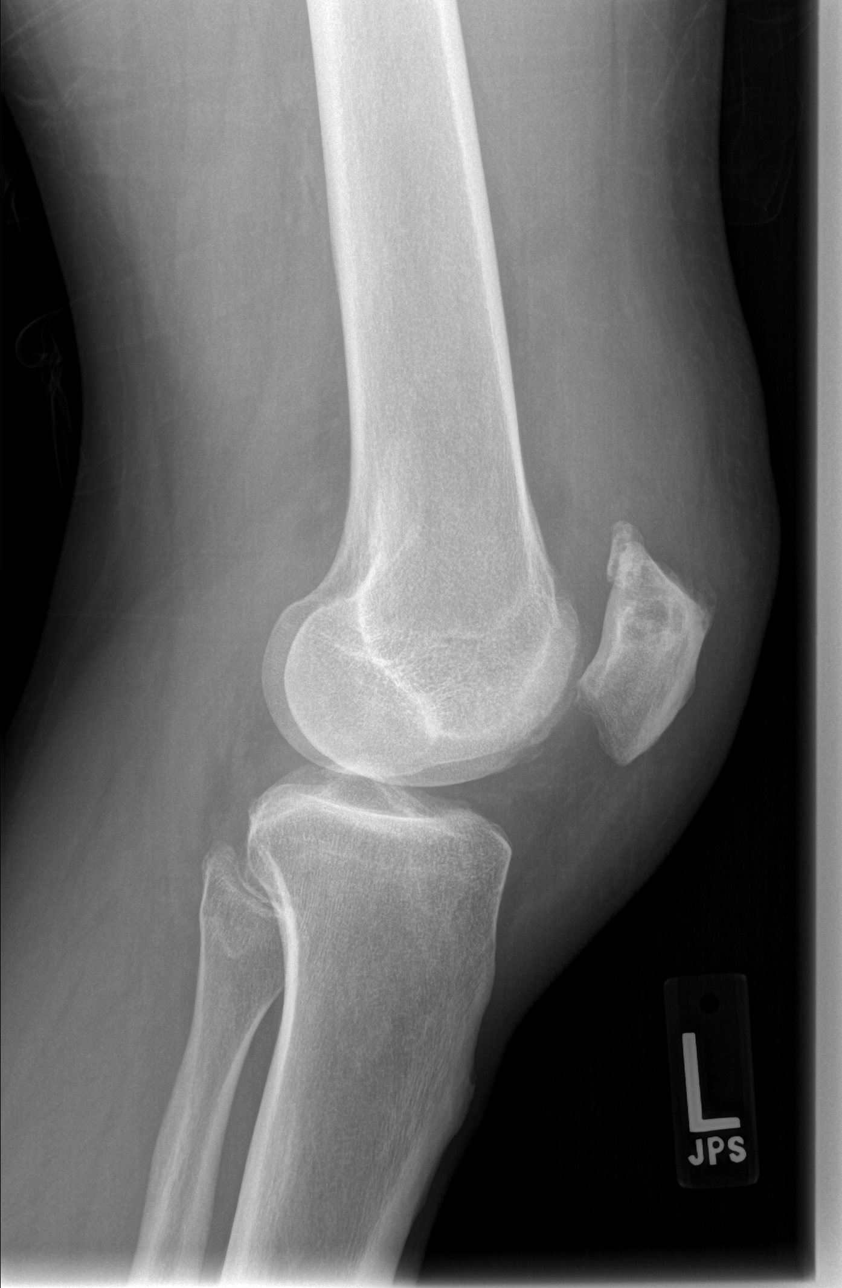

[4 of 4 positions shown; findings below may reference images not displayed]

FINDINGS: There is no acute bony or joint abnormality.  The patient
has a bipartite patella with degenerative disease in the
patellofemoral compartment.  No joint effusion.  There is some soft
tissue swelling anterior to the patella.
IMPRESSION: 1.  Negative for fracture.
2.  Bipartite patella with patellofemoral degenerative change.
3.  Prepatellar soft tissue swelling suggestive of bursitis.

## 2012-09-09 ENCOUNTER — Telehealth: Payer: Self-pay | Admitting: Cardiovascular Disease

## 2012-09-09 MED ORDER — MECLIZINE HCL 25 MG PO TABS: 25.0000 mg | ORAL_TABLET | Freq: Three times a day (TID) | ORAL | Status: DC | PRN

## 2012-09-09 NOTE — Telephone Encounter (Signed)
SPOKE WITH PT NEEDS  REFILL ON MECLIZINE  25 MG  PRN  OKAY TO FILL PER DR NISHAN ALSO C/O COLD FOR 2 WEEKS   COMPLAINING DRY COUGH  BODY HURTS ALL OVER   AND MAY  HAVE FEVER INFORMED PT  MAY TAKE  CORICIDIN HBP   MEDS FOR SYMPTOMS AS DIRECTED  WILL PICK UP AT [facility]  ALSO PER DR Eden Emms  MAY  GO TO [facility] W[facility]  WILL SEE PT'S  UNTIL February 2014

## 2012-09-09 NOTE — Telephone Encounter (Signed)
New Problem:    Patient called in because something is going on with him that he really needs to talk to you about.  Please call back.

## 2012-09-11 ENCOUNTER — Emergency Department (HOSPITAL_COMMUNITY): Payer: Self-pay

## 2012-09-11 ENCOUNTER — Emergency Department (HOSPITAL_COMMUNITY)
Admission: EM | Admit: 2012-09-11 | Discharge: 2012-09-11 | Disposition: A | Discharge status: Home / Self Care | Payer: Self-pay | Attending: Emergency Medicine | Admitting: Emergency Medicine

## 2012-09-11 ENCOUNTER — Encounter (HOSPITAL_COMMUNITY): Payer: Self-pay | Admitting: *Deleted

## 2012-09-11 ENCOUNTER — Other Ambulatory Visit: Payer: Self-pay

## 2012-09-11 DIAGNOSIS — I252 Old myocardial infarction: Secondary | ICD-10-CM | POA: Insufficient documentation

## 2012-09-11 DIAGNOSIS — Z8739 Personal history of other diseases of the musculoskeletal system and connective tissue: Secondary | ICD-10-CM | POA: Insufficient documentation

## 2012-09-11 DIAGNOSIS — F1410 Cocaine abuse, uncomplicated: Secondary | ICD-10-CM | POA: Insufficient documentation

## 2012-09-11 DIAGNOSIS — Z8719 Personal history of other diseases of the digestive system: Secondary | ICD-10-CM | POA: Insufficient documentation

## 2012-09-11 DIAGNOSIS — E7800 Pure hypercholesterolemia, unspecified: Secondary | ICD-10-CM | POA: Insufficient documentation

## 2012-09-11 DIAGNOSIS — I10 Essential (primary) hypertension: Secondary | ICD-10-CM | POA: Insufficient documentation

## 2012-09-11 DIAGNOSIS — Z8673 Personal history of transient ischemic attack (TIA), and cerebral infarction without residual deficits: Secondary | ICD-10-CM | POA: Insufficient documentation

## 2012-09-11 DIAGNOSIS — Z79899 Other long term (current) drug therapy: Secondary | ICD-10-CM | POA: Insufficient documentation

## 2012-09-11 DIAGNOSIS — E119 Type 2 diabetes mellitus without complications: Secondary | ICD-10-CM | POA: Insufficient documentation

## 2012-09-11 DIAGNOSIS — Z8679 Personal history of other diseases of the circulatory system: Secondary | ICD-10-CM | POA: Insufficient documentation

## 2012-09-11 DIAGNOSIS — Z8659 Personal history of other mental and behavioral disorders: Secondary | ICD-10-CM | POA: Insufficient documentation

## 2012-09-11 DIAGNOSIS — Z7982 Long term (current) use of aspirin: Secondary | ICD-10-CM | POA: Insufficient documentation

## 2012-09-11 DIAGNOSIS — R51 Headache: Secondary | ICD-10-CM | POA: Insufficient documentation

## 2012-09-11 DIAGNOSIS — M109 Gout, unspecified: Secondary | ICD-10-CM | POA: Insufficient documentation

## 2012-09-11 DIAGNOSIS — Z8701 Personal history of pneumonia (recurrent): Secondary | ICD-10-CM | POA: Insufficient documentation

## 2012-09-11 LAB — BASIC METABOLIC PANEL
BUN: 16 mg/dL (ref 6–23)
CO2: 15 mEq/L — ABNORMAL LOW (ref 19–32)
Calcium: 9.7 mg/dL (ref 8.4–10.5)
Creatinine, Ser: 1.14 mg/dL (ref 0.50–1.35)

## 2012-09-11 LAB — CBC WITH DIFFERENTIAL/PLATELET
Basophils Absolute: 0 10*3/uL (ref 0.0–0.1)
Eosinophils Relative: 0 % (ref 0–5)
HCT: 42.5 % (ref 39.0–52.0)
Hemoglobin: 14.8 g/dL (ref 13.0–17.0)
Lymphocytes Relative: 16 % (ref 12–46)
MCHC: 34.8 g/dL (ref 30.0–36.0)
MCV: 88.5 fL (ref 78.0–100.0)
Monocytes Absolute: 0.9 10*3/uL (ref 0.1–1.0)
Monocytes Relative: 11 % (ref 3–12)
RDW: 13.4 % (ref 11.5–15.5)

## 2012-09-11 MED ORDER — DEXAMETHASONE SODIUM PHOSPHATE 10 MG/ML IJ SOLN
10.0000 mg | Freq: Once | INTRAMUSCULAR | Status: AC
  Administered 2012-09-11: 10 mg via INTRAVENOUS
  Filled 2012-09-11: qty 1

## 2012-09-11 NOTE — ED Notes (Signed)
Pa into speak to pt about dispo and dx of gout

## 2012-09-11 NOTE — ED Notes (Signed)
Pt states that he hit his rt arm on a steel rack several times last night while at work states he works at [employer] and shake

## 2012-09-11 NOTE — ED Provider Notes (Signed)
History     CSN: [id number]  Arrival date & time 09/11/12  0919   First MD Initiated Contact with Patient 09/11/12 1015      Chief Complaint  Patient presents with  . Arm Pain  . Facial Pain    (Consider location/radiation/quality/duration/timing/severity/associated sxs/prior treatment) The history is provided by the patient and medical records. No language interpreter was used.   C/o sudden onset (1:00am) pain in R hand  Radiation up arm .  Side of face went numb.  H/a  R occiput. + blurred vision and Vertigo.   + Right sided weakness.   Still c/o numbnes  In face and  HA resolved.  Vision resolved as well as Vertigo. Feels a bit light headed with standing.  Previous history of gout.  Usually affects his right hand.  Complains of right hand heat, swelling, tenderness to palpation.this is similar to previous bouts of gout.  Denies fevers, chills, myalgias, arthralgias. Denies DOE, SOB, chest tightness or pressure, radiation to left arm, jaw or back, or diaphoresis. Denies dysuria, flank pain, suprapubic pain, frequency, urgency, or hematuria.  Denies abdominal pain, nausea, vomiting, diarrhea or constipation.     Past Medical History  Diagnosis Date  . Hyperglycemia   . Gout, unspecified   . Heart block   . Hypercholesterolemia   . Chest pain   . Hyperlipidemia   . HTN (hypertension)   . Muscle spasm   . Diabetes mellitus   . Chest pain   . Abdominal pain   . Rt groin pain   . Blood in stool   . Migraines   . Stroke   . Arthritis   . Pneumonia   . Depression   . Myocardial infarction     drug cocaine and marijuana at time of mi none  since    . Cocaine abuse     history    Past Surgical History  Procedure Date  . Reapir of left inguinal hernia 02/02/2003    Dr. Violeta Gelinas. repair with mesh. Same procedure done again in  05/22/2004   . Rotator cuff repair 2010    left   . Cardiac catheterization 2008    [place]-40%lad  . Reconstruct / stabilize distal  ulna   . Inguinal hernia repair 05/12/2012    Procedure: HERNIA REPAIR INGUINAL ADULT;  Surgeon: Liz Malady, MD;  Location: [facility];  Service: General;  Laterality: Right;  . Hernia repair     Family History  Problem Relation Age of Onset  . Heart attack Mother     x7. Still alive  . Heart attack Father     deceased b/c of MI  . Heart attack Brother     older, deceased b/c of MI     History  Substance Use Topics  . Smoking status: Never Smoker   . Smokeless tobacco: Never Used  . Alcohol Use: 4.2 oz/week    7 Cans of beer per week     Comment: occasional beer      Review of Systems Ten systems are reviewed and are negative for acute change except as noted in the HPI   Allergies  Shellfish allergy  Home Medications   Current Outpatient Rx  Name  Route  Sig  Dispense  Refill  . ALLOPURINOL 300 MG PO TABS   Oral   Take 1 tablet (300 mg total) by mouth daily.   30 tablet   6   . ASPIRIN EC 325 MG PO  TBEC   Oral   Take 325 mg by mouth daily.           Marland Kitchen CALCIUM-VITAMIN D PO   Oral   Take 1 tablet by mouth daily.         . CYCLOBENZAPRINE HCL 10 MG PO TABS   Oral   Take 10 mg by mouth 3 (three) times daily as needed. For muscle spasms         . HYDROCHLOROTHIAZIDE 25 MG PO TABS   Oral   Take 25 mg by mouth daily.          . IBUPROFEN 200 MG PO TABS   Oral   Take 800 mg by mouth every 6 (six) hours as needed. For pain         . INDOMETHACIN 25 MG PO CAPS   Oral   Take 25 mg by mouth 3 (three) times daily with meals.          Marland Kitchen GERITOL PO   Oral   Take 2 tablets by mouth daily.         Marland Kitchen LOSARTAN POTASSIUM 50 MG PO TABS   Oral   Take 1 tablet (50 mg total) by mouth daily.   30 tablet   2   . MECLIZINE HCL 25 MG PO TABS   Oral   Take 1 tablet (25 mg total) by mouth 3 (three) times daily as needed.   90 tablet   1   . PRAVASTATIN SODIUM 40 MG PO TABS   Oral   Take 1 tablet (40 mg total) by mouth daily.    30 tablet   2     BP 121/72  Pulse 74  Temp 98.5 F (36.9 C) (Oral)  Resp 20  SpO2 97%  Physical Exam Physical Exam  Nursing note and vitals reviewed. Constitutional: He appears well-developed and well-nourished. No distress.  HENT:  Head: Normocephalic and atraumatic.  Eyes: Conjunctivae normal are normal. No scleral icterus.  Neck: Normal range of motion. Neck supple.  Cardiovascular: Normal rate, regular rhythm and normal heart sounds.   Pulmonary/Chest: Effort normal and breath sounds normal. No respiratory distress.  Abdominal: Soft. There is no tenderness.  Musculoskeletal: Right hand swelling, heat erythema, tender to palpation.  Range of motion limited due to pain.  No swelling heat or redness in the elbow or shoulder.  Patient has full range of motion of the shoulder but is tender to palpation.   Neurological: He is alert.  Speech is clear and goal oriented, follows commands Major Cranial nerves without deficit, no facial droop Normal strength in upper and lower extremities bilaterally including dorsiflexion and plantar flexion Sensation normal to light and sharp touch Moves extremities without ataxia, coordination intact Skin: Skin is warm and dry. He is not diaphoretic.  Psychiatric: His behavior is normal.    ED Course  Procedures (including critical care time)   Labs Reviewed  POCT I-STAT TROPONIN I  CBC WITH DIFFERENTIAL  BASIC METABOLIC PANEL   Dg Forearm Right  09/11/2012  *RADIOLOGY REPORT*  Clinical Data: 56 year old male with extreme pain in the right forearm, especially the wrist.  Stiffness.  RIGHT FOREARM - 2 VIEW  Comparison: 01/21/2011 and earlier.  Findings: Chronic deformity of the dorsal distal ulna re-identified and stable.  Distal radius and ulna are stable and intact.  Grossly stable and normal carpal bone alignment.  More proximal radius and ulna are stable and intact.  There is chronic degenerative spurring at the  olecranon and radial head.   No definite joint effusion at the elbow.  No fracture or dislocation identified.  IMPRESSION: Stable chronic changes at the wrist and elbow.  No acute osseous abnormality identified.   Original Report Authenticated By: Erskine Speed, M.D.    Ct Head Wo Contrast  09/11/2012  *RADIOLOGY REPORT*  Clinical Data: Right-sided weakness.  Headache.  CT HEAD WITHOUT CONTRAST  Technique:  Contiguous axial images were obtained from the base of the skull through the vertex without contrast.  Comparison: CT 01/26/2012  Findings: Ventricle size is normal.  Mild chronic hypodensity in the frontal white matter on the left is unchanged.  No acute infarct.  Negative for hemorrhage or mass.  Sinusitis with mucosal edema in the ethmoid sinuses.  Air-fluid level in the sphenoid sinus.  IMPRESSION: No acute intracranial abnormality.  Sinusitis with air-fluid level in the sphenoid sinus.   Original Report Authenticated By: Janeece Riggers, M.D.      No diagnosis found.   Date: 09/11/2012  Rate: 52  Rhythm: normal sinus rhythm  QRS Axis: normal  Intervals: normal  ST/T Wave abnormalities: normal  Conduction Disutrbances: none  Narrative Interpretation: sinus bradycardia  Old EKG Reviewed: No significant changes noted     MDM  Patient with gout flare.  Neg.troponin/ ekg changes. No CT abnormalities and normal neuro exam. Given decadron in ed. Patient has been unable to refill allupurinol/indomethacin/prednisone scripts form PCP, but states he has money and can refill today.   Will d.c.  Discussed reasons to seek immediate care. Patient expresses understanding and agrees with plan.         Arthor Captain, PA-C 09/12/12 [phone]

## 2012-09-11 NOTE — ED Notes (Signed)
Pt in c/o right arm pain, chest pain and right facial pain since last night. Pt also c/o dizziness and weakness.

## 2012-09-11 NOTE — ED Notes (Signed)
Rt arm pain from elbow to wrist hand is swollen has hx of gout

## 2012-09-13 NOTE — ED Provider Notes (Signed)
Medical screening examination/treatment/procedure(s) were performed by non-physician practitioner and as supervising physician I was immediately available for consultation/collaboration.  Jones Skene, M.D.     Jones Skene, MD 09/13/12 1125

## 2012-09-19 IMAGING — CT CT HEAD W/O CM
1 of 2 series · 13 of 30 positions shown, 17 images · non-contrast
Comparison: MRI brain dated 01/21/2011

CLINICAL DATA: Headache, left-sided numbness, dizziness

CT HEAD WITHOUT CONTRAST
TECHNIQUE: Contiguous axial images were obtained from the base of
the skull through the vertex without contrast.

[Series 2: brain · axial · 0.47mm/px · z∈[+153,+283]mm · 13 of 28 slices shown, 17 images]
[im 2/28  brain]
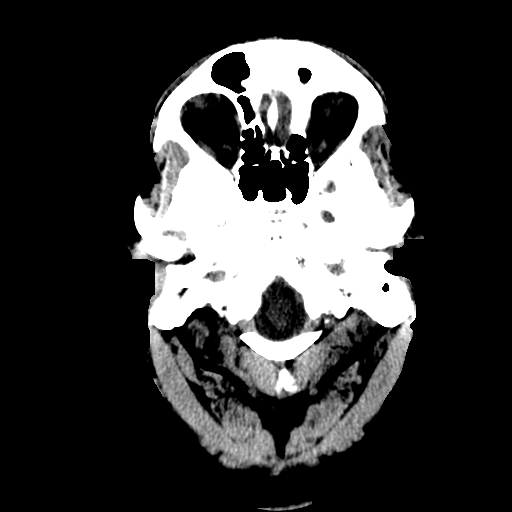
[im 2/28  bone]
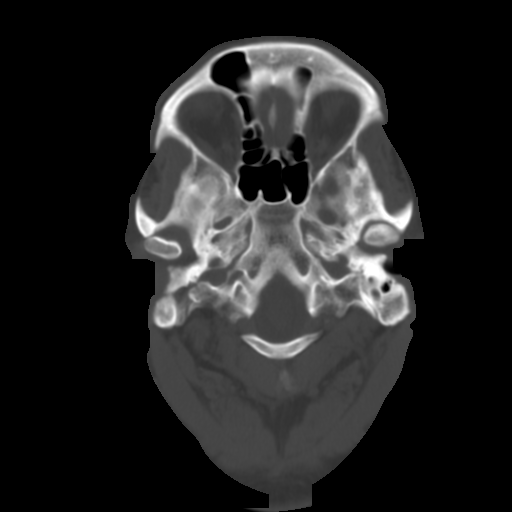
[im 4/28  brain]
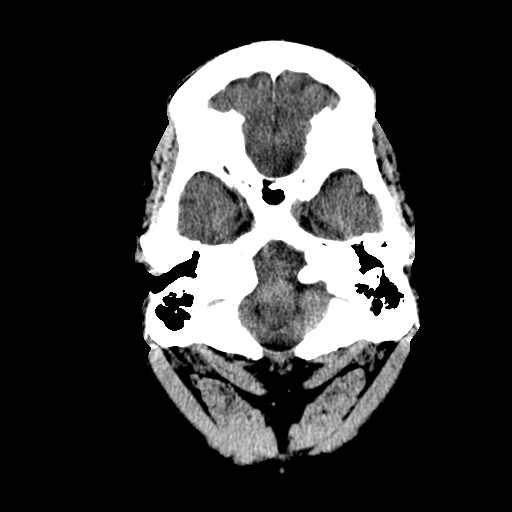
[im 6/28  brain]
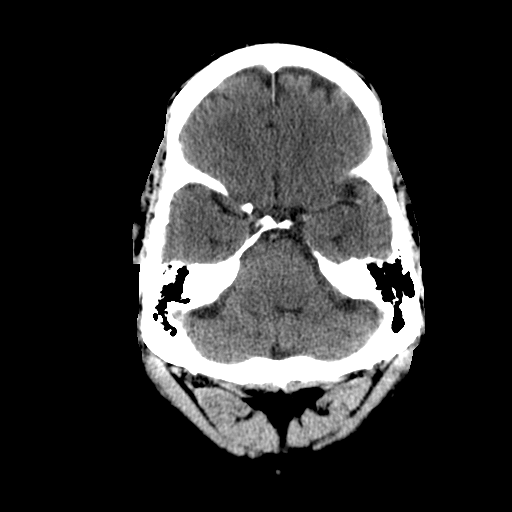
[im 8/28  brain]
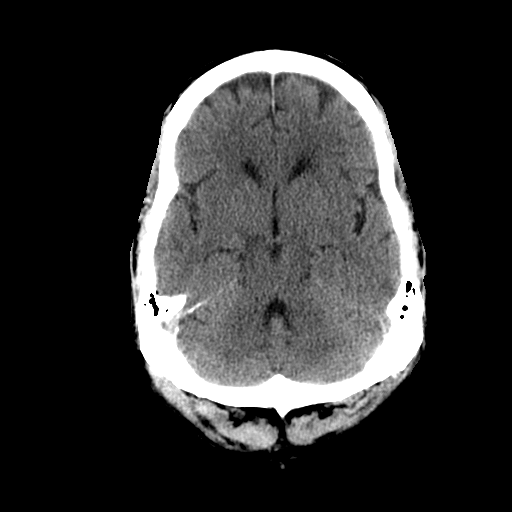
[im 10/28  brain]
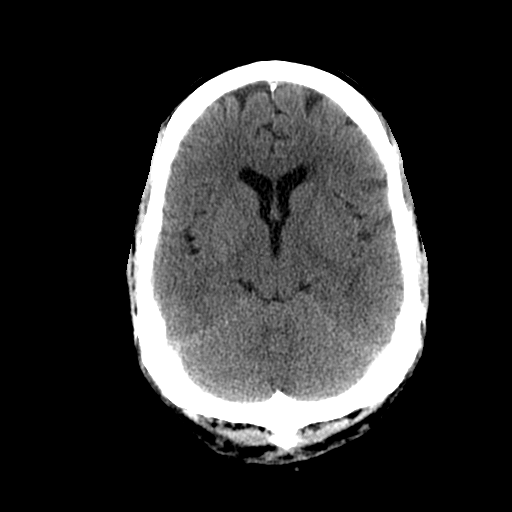
[im 10/28  bone]
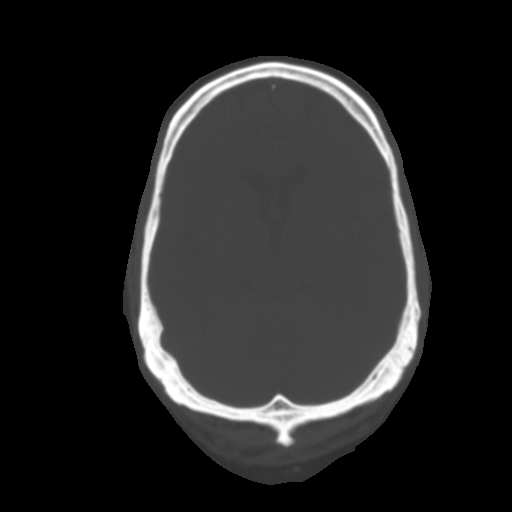
[im 12/28  brain]
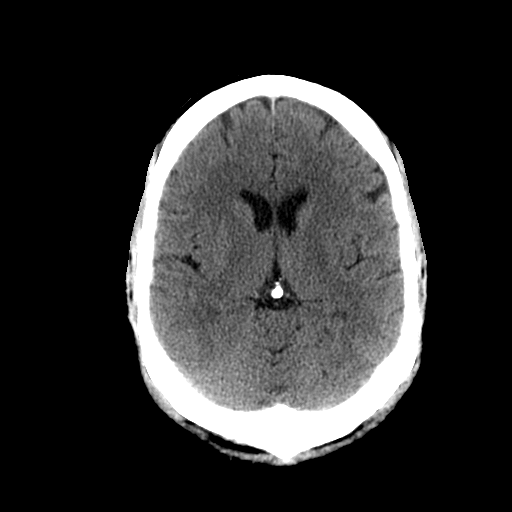
[im 14/28  brain]
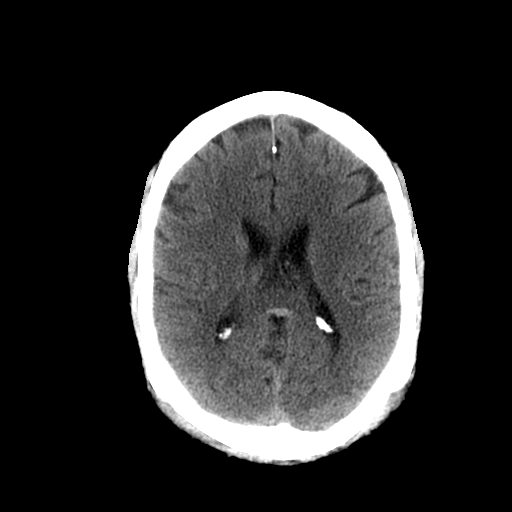
[im 16/28  brain]
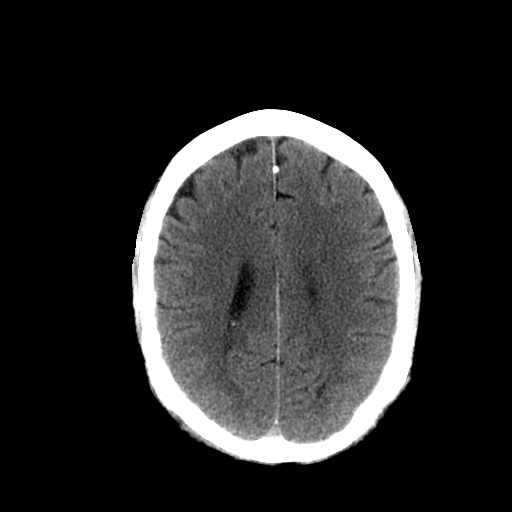
[im 18/28  brain]
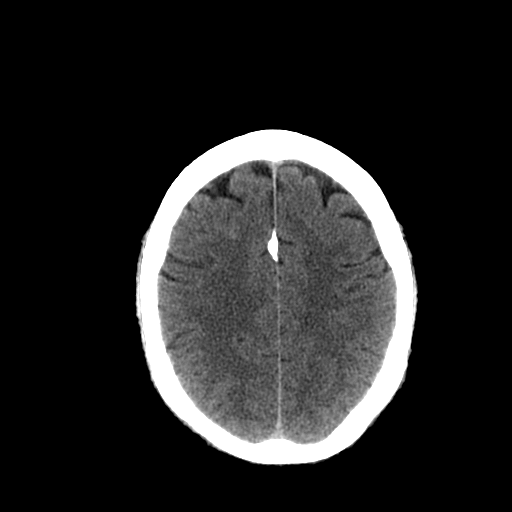
[im 18/28  bone]
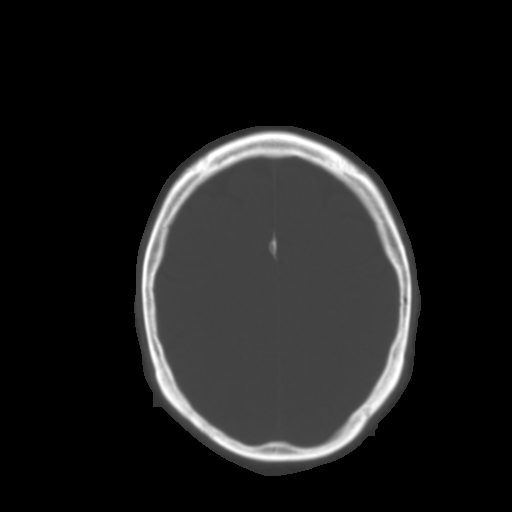
[im 20/28  brain]
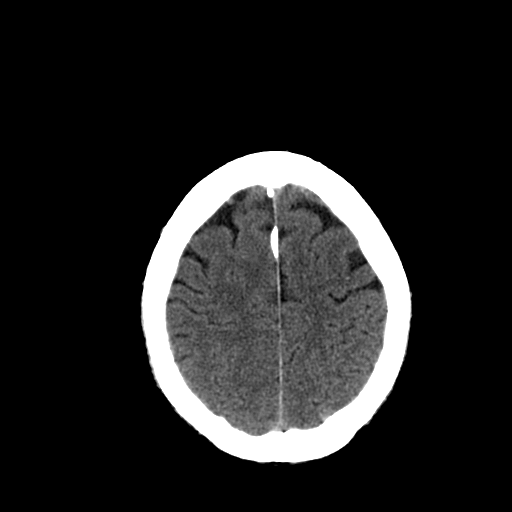
[im 22/28  brain]
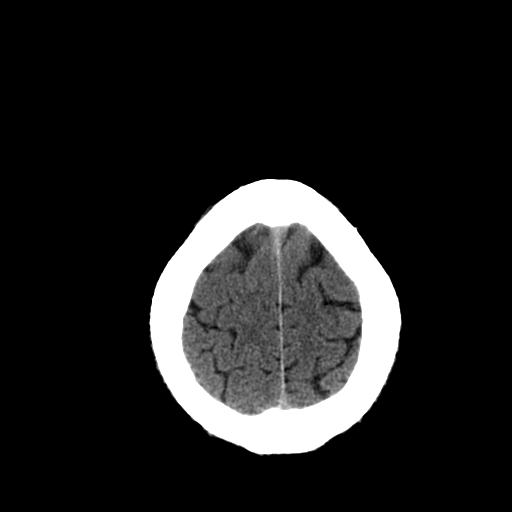
[im 24/28  brain]
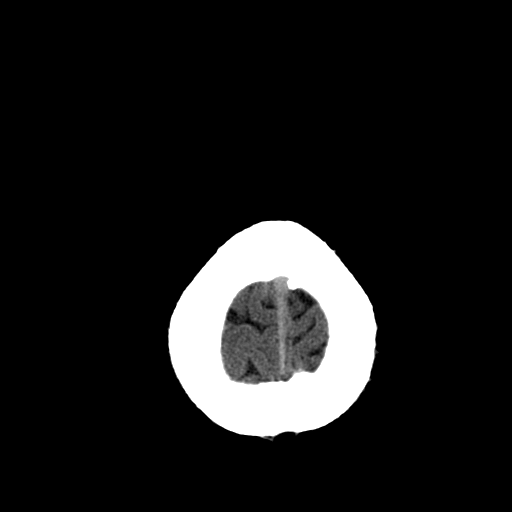
[im 26/28  brain]
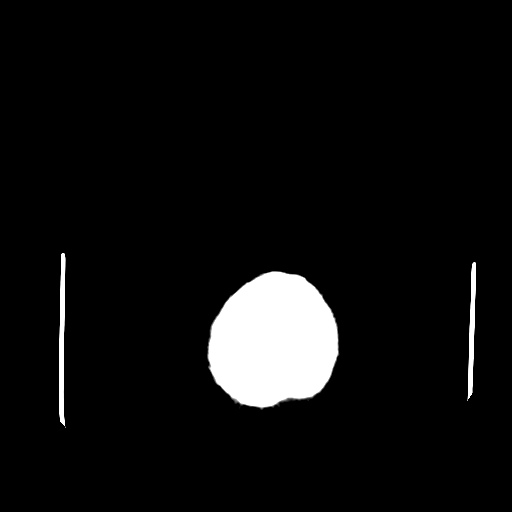
[im 26/28  bone]
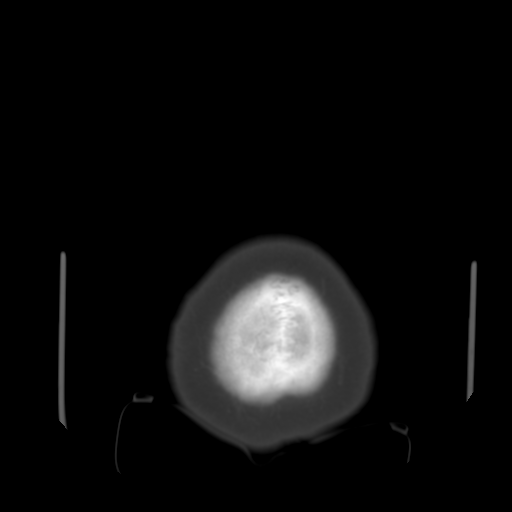

[13 of 30 positions shown; findings below may reference images not displayed]

FINDINGS: No evidence of parenchymal hemorrhage or extra-axial
fluid collection. No mass lesion, mass effect, or midline shift.

No CT evidence of acute infarction.

Mild periventricular small vessel ischemic changes.

Cerebral volume is age appropriate.  No ventriculomegaly.

The visualized paranasal sinuses are essentially clear. The mastoid
air cells are unopacified.

No evidence of calvarial fracture.
IMPRESSION: No evidence of acute intracranial abnormality.

Mild small vessel ischemic changes.

## 2012-10-07 ENCOUNTER — Other Ambulatory Visit: Payer: Self-pay | Admitting: Cardiovascular Disease

## 2012-10-07 MED ORDER — PREDNISONE 10 MG PO TABS: ORAL_TABLET | ORAL | Status: DC

## 2012-10-07 NOTE — Telephone Encounter (Signed)
Ok to refill per Dr. Eden Emms.  Caralee Ates, CMA

## 2012-10-29 IMAGING — CT CT HEAD W/O CM
1 of 2 series · 16 of 30 positions shown, 20 images · non-contrast
Comparison: 12/17/2011

CLINICAL DATA: Seizure

CT HEAD WITHOUT CONTRAST
TECHNIQUE: Contiguous axial images were obtained from the base of
the skull through the vertex without contrast

[Series 3: recon 2: brain · axial · 0.47mm/px · z∈[+125,+273]mm · 16 of 64 slices shown, 20 images]
[im 4/64  brain]
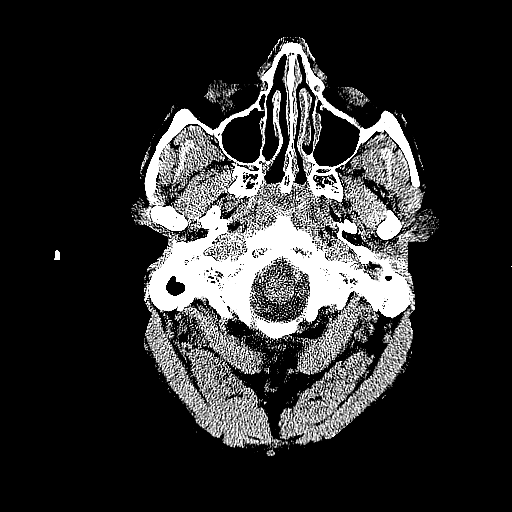
[im 4/64  bone]
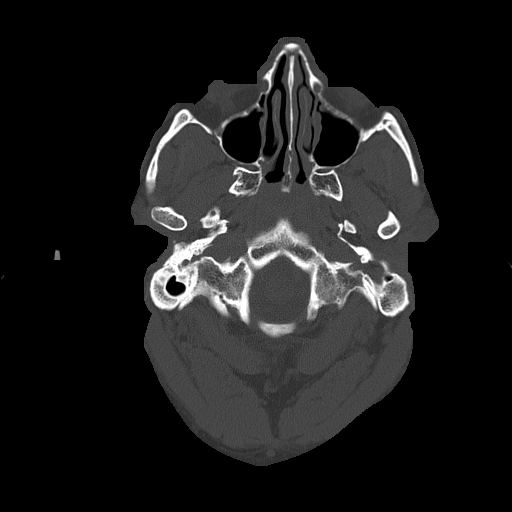
[im 7/64  brain]
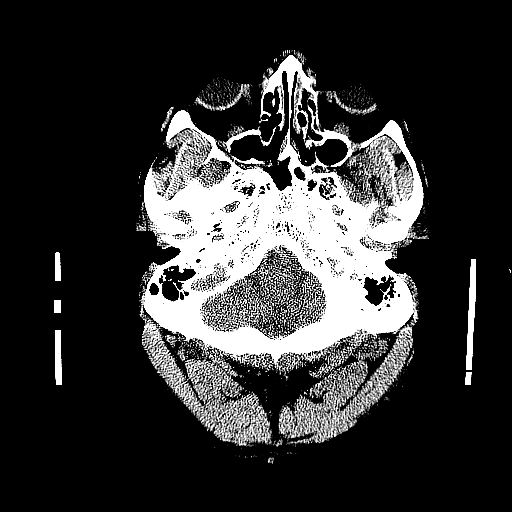
[im 10/64  brain]
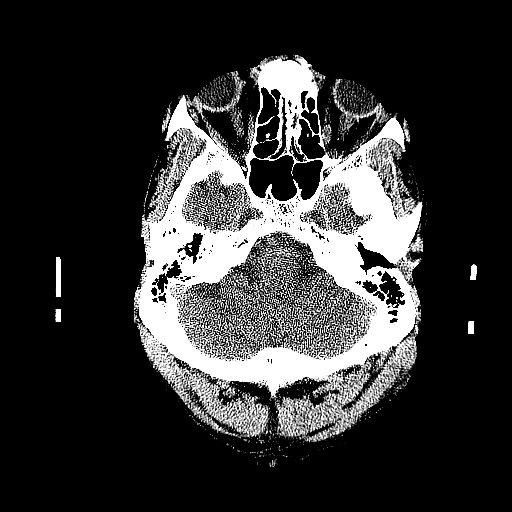
[im 14/64  brain]
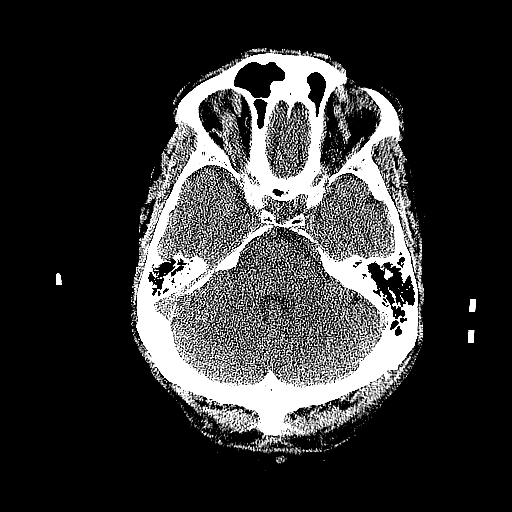
[im 20/64  brain]
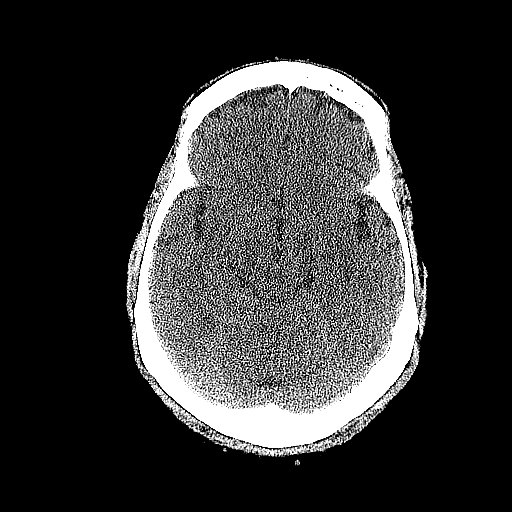
[im 20/64  bone]
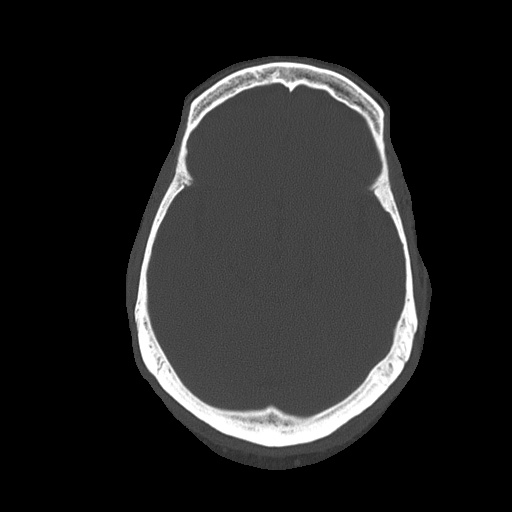
[im 24/64  brain]
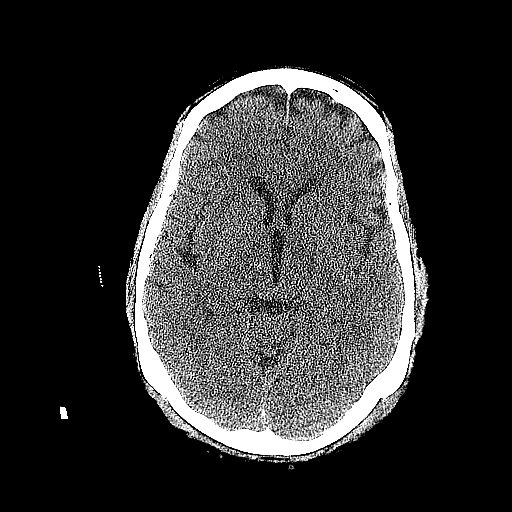
[im 27/64  brain]
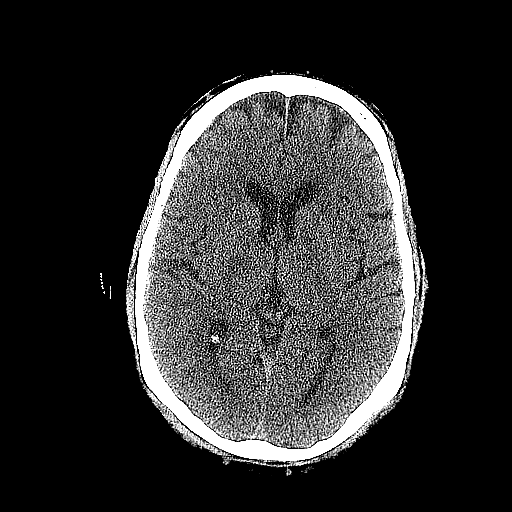
[im 30/64  brain]
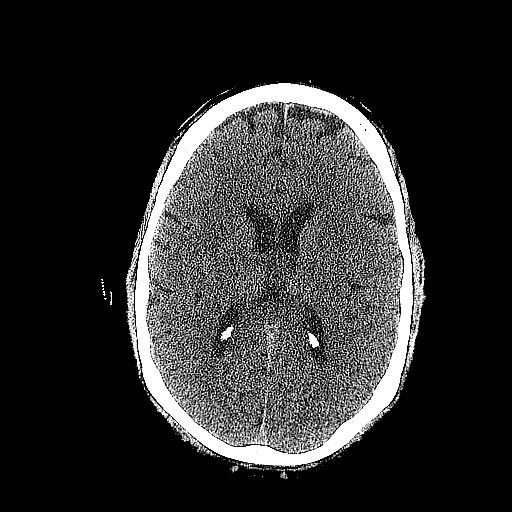
[im 34/64  brain]
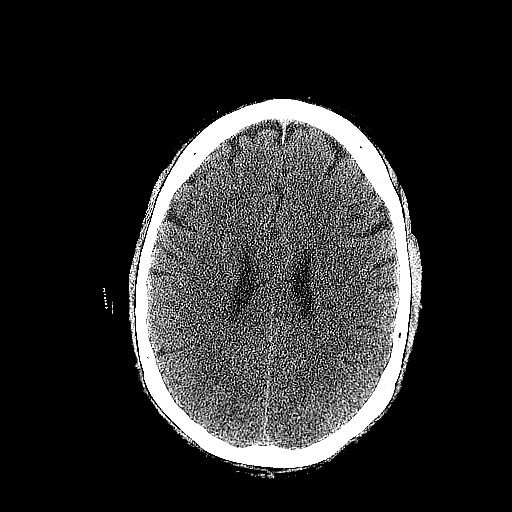
[im 34/64  bone]
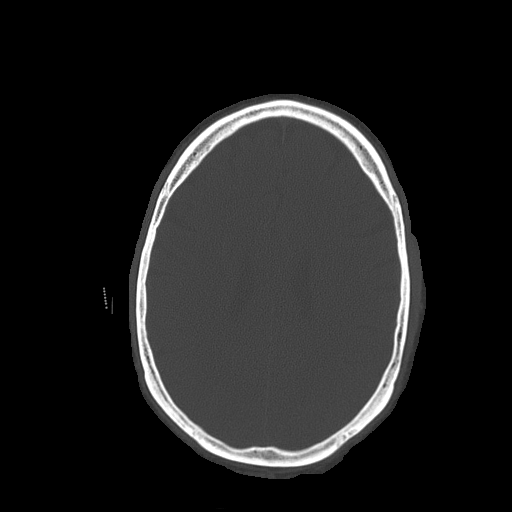
[im 37/64  brain]
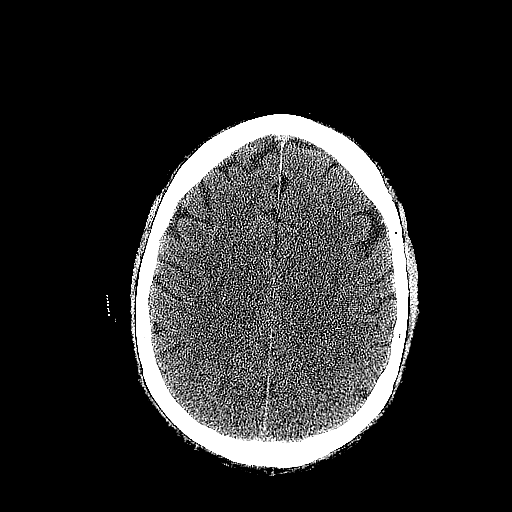
[im 40/64  brain]
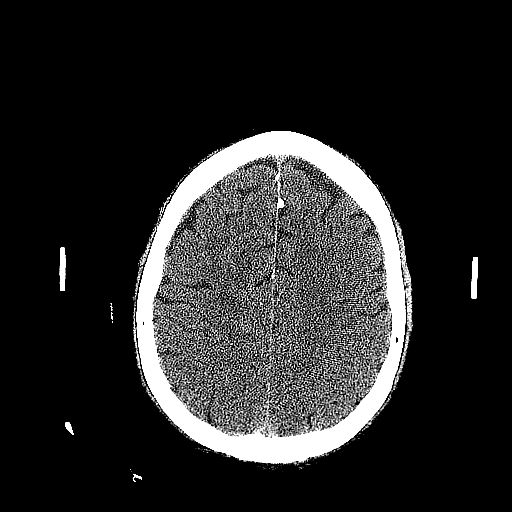
[im 44/64  brain]
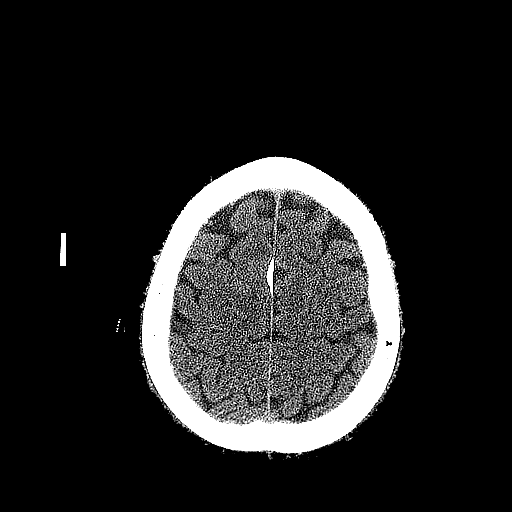
[im 50/64  brain]
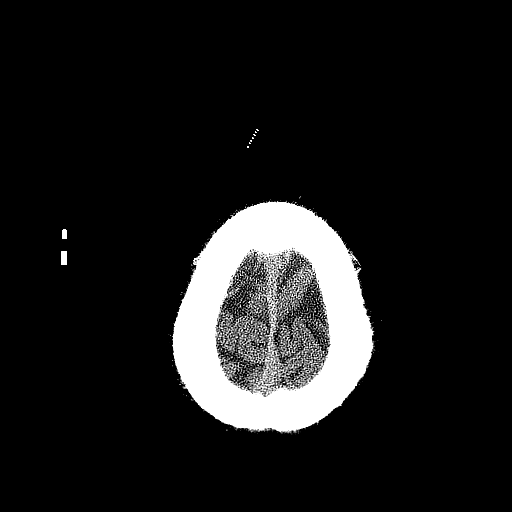
[im 50/64  bone]
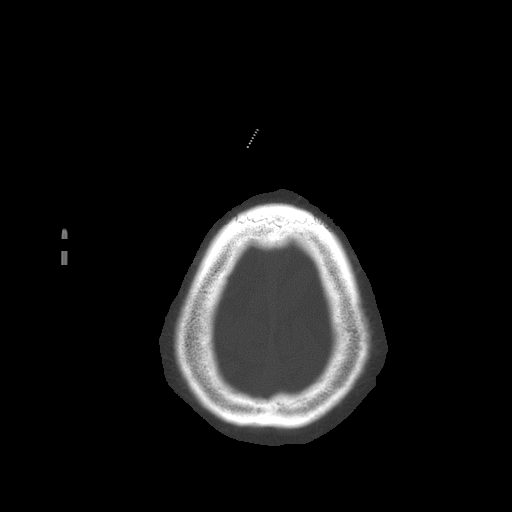
[im 54/64  brain]
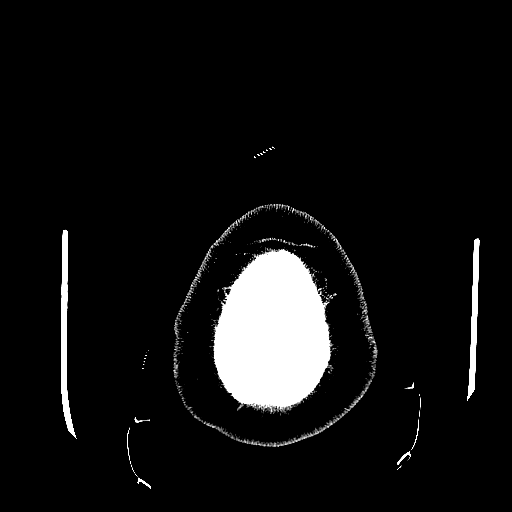
[im 57/64  brain]
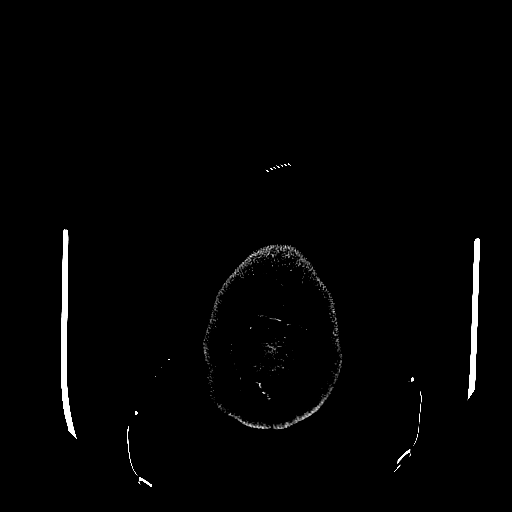
[im 60/64  brain]
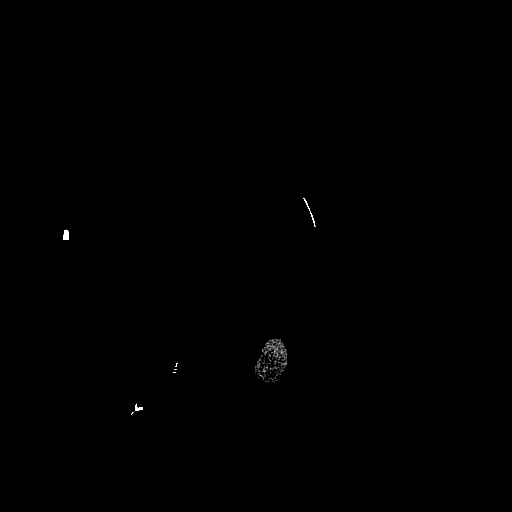

[16 of 30 positions shown; findings below may reference images not displayed]

FINDINGS: The brain has a normal appearance without evidence for
hemorrhage, acute infarction, hydrocephalus, or mass lesion.  There
is no extra axial fluid collection.  The skull and paranasal
sinuses are normal.
IMPRESSION: Normal CT of the head without contrast.

## 2013-01-14 ENCOUNTER — Inpatient Hospital Stay (HOSPITAL_COMMUNITY)
Admission: EM | Admit: 2013-01-14 | Discharge: 2013-01-16 | DRG: 313 | Disposition: A | Discharge status: Home / Self Care | Payer: MEDICAID | Attending: Internal Medicine | Admitting: Internal Medicine

## 2013-01-14 ENCOUNTER — Encounter (HOSPITAL_COMMUNITY): Payer: Self-pay | Admitting: Emergency Medicine

## 2013-01-14 ENCOUNTER — Emergency Department (HOSPITAL_COMMUNITY): Payer: Self-pay

## 2013-01-14 DIAGNOSIS — I459 Conduction disorder, unspecified: Secondary | ICD-10-CM | POA: Diagnosis present

## 2013-01-14 DIAGNOSIS — I10 Essential (primary) hypertension: Secondary | ICD-10-CM

## 2013-01-14 DIAGNOSIS — M129 Arthropathy, unspecified: Secondary | ICD-10-CM | POA: Diagnosis present

## 2013-01-14 DIAGNOSIS — I252 Old myocardial infarction: Secondary | ICD-10-CM

## 2013-01-14 DIAGNOSIS — R079 Chest pain, unspecified: Secondary | ICD-10-CM

## 2013-01-14 DIAGNOSIS — Z23 Encounter for immunization: Secondary | ICD-10-CM

## 2013-01-14 DIAGNOSIS — F17200 Nicotine dependence, unspecified, uncomplicated: Secondary | ICD-10-CM | POA: Diagnosis present

## 2013-01-14 DIAGNOSIS — Z7982 Long term (current) use of aspirin: Secondary | ICD-10-CM

## 2013-01-14 DIAGNOSIS — I129 Hypertensive chronic kidney disease with stage 1 through stage 4 chronic kidney disease, or unspecified chronic kidney disease: Secondary | ICD-10-CM | POA: Diagnosis present

## 2013-01-14 DIAGNOSIS — F329 Major depressive disorder, single episode, unspecified: Secondary | ICD-10-CM | POA: Diagnosis present

## 2013-01-14 DIAGNOSIS — N189 Chronic kidney disease, unspecified: Secondary | ICD-10-CM | POA: Diagnosis present

## 2013-01-14 DIAGNOSIS — F1410 Cocaine abuse, uncomplicated: Secondary | ICD-10-CM | POA: Diagnosis present

## 2013-01-14 DIAGNOSIS — F3289 Other specified depressive episodes: Secondary | ICD-10-CM | POA: Diagnosis present

## 2013-01-14 DIAGNOSIS — M109 Gout, unspecified: Secondary | ICD-10-CM | POA: Diagnosis present

## 2013-01-14 DIAGNOSIS — E119 Type 2 diabetes mellitus without complications: Secondary | ICD-10-CM | POA: Diagnosis present

## 2013-01-14 DIAGNOSIS — E7800 Pure hypercholesterolemia, unspecified: Secondary | ICD-10-CM

## 2013-01-14 DIAGNOSIS — I2510 Atherosclerotic heart disease of native coronary artery without angina pectoris: Secondary | ICD-10-CM | POA: Diagnosis present

## 2013-01-14 DIAGNOSIS — M62838 Other muscle spasm: Secondary | ICD-10-CM

## 2013-01-14 DIAGNOSIS — Z8673 Personal history of transient ischemic attack (TIA), and cerebral infarction without residual deficits: Secondary | ICD-10-CM

## 2013-01-14 DIAGNOSIS — F1910 Other psychoactive substance abuse, uncomplicated: Secondary | ICD-10-CM

## 2013-01-14 DIAGNOSIS — Z79899 Other long term (current) drug therapy: Secondary | ICD-10-CM

## 2013-01-14 DIAGNOSIS — F1010 Alcohol abuse, uncomplicated: Secondary | ICD-10-CM | POA: Diagnosis present

## 2013-01-14 DIAGNOSIS — R0789 Other chest pain: Principal | ICD-10-CM | POA: Diagnosis present

## 2013-01-14 DIAGNOSIS — E785 Hyperlipidemia, unspecified: Secondary | ICD-10-CM | POA: Diagnosis present

## 2013-01-14 DIAGNOSIS — Z8249 Family history of ischemic heart disease and other diseases of the circulatory system: Secondary | ICD-10-CM

## 2013-01-14 MED ORDER — ASPIRIN 81 MG PO CHEW
324.0000 mg | CHEWABLE_TABLET | Freq: Once | ORAL | Status: AC
  Administered 2013-01-14: 324 mg via ORAL
  Filled 2013-01-14: qty 4

## 2013-01-14 MED ORDER — MORPHINE SULFATE 4 MG/ML IJ SOLN
4.0000 mg | Freq: Once | INTRAMUSCULAR | Status: AC
  Administered 2013-01-14: 4 mg via INTRAVENOUS
  Filled 2013-01-14: qty 1

## 2013-01-14 MED ORDER — NITROGLYCERIN 0.4 MG SL SUBL
0.4000 mg | SUBLINGUAL_TABLET | SUBLINGUAL | Status: AC | PRN
  Administered 2013-01-15 (×3): 0.4 mg via SUBLINGUAL
  Filled 2013-01-14: qty 75

## 2013-01-14 NOTE — ED Notes (Addendum)
Pt states that he started having crushing L chest pain approx. 1 hr ago. Had a MI one year ago. Feels similar. Pt took 2 nitro SL 45 mins ago and had no relief.

## 2013-01-14 NOTE — ED Provider Notes (Signed)
History     CSN: [id number]  Arrival date & time 01/14/13  2329   First MD Initiated Contact with Patient 01/14/13 2339      Chief Complaint  Patient presents with  . Chest Pain    (Consider location/radiation/quality/duration/timing/severity/associated sxs/prior treatment) HPI Comments: Patient claims of crushing left-sided chest pain that is onset one hour ago while getting ready for work. It radiates to his left arm is associated with numbness. Is unrelieved by nitroglycerin. He reports having a MI one year ago but denies having stents. History of cocaine abuse, last use one week ago. Associated with shortness of breath and cough. Pain is somewhat reproducible to palpation. Radiates to his arm and neck but not to his back. Denies any leg pain or swelling. Denies abdominal pain or vomiting.  The history is provided by the patient.    Past Medical History  Diagnosis Date  . Hyperglycemia   . Gout, unspecified   . Heart block   . Hypercholesterolemia   . Chest pain   . Hyperlipidemia   . HTN (hypertension)   . Muscle spasm   . Diabetes mellitus   . Chest pain   . Abdominal pain   . Rt groin pain   . Blood in stool   . Migraines   . Stroke   . Arthritis   . Pneumonia   . Depression   . Myocardial infarction     drug cocaine and marijuana at time of mi none  since    . Cocaine abuse     history    Past Surgical History  Procedure Laterality Date  . Reapir of left inguinal hernia  02/02/2003    Dr. Violeta Gelinas. repair with mesh. Same procedure done again in  05/22/2004   . Rotator cuff repair  2010    left   . Cardiac catheterization  2008    -40%lad  . Reconstruct / stabilize distal ulna    . Inguinal hernia repair  05/12/2012    Procedure: HERNIA REPAIR INGUINAL ADULT;  Surgeon: Liz Malady, MD;  Location: [facility];  Service: General;  Laterality: Right;  . Hernia repair      Family History  Problem Relation Age of Onset  .  Heart attack Mother     x7. Still alive  . Heart attack Father     deceased b/c of MI  . Heart attack Brother     older, deceased b/c of MI     History  Substance Use Topics  . Smoking status: Never Smoker   . Smokeless tobacco: Never Used  . Alcohol Use: 4.2 oz/week    7 Cans of beer per week     Comment: occasional beer      Review of Systems  Constitutional: Negative for fever, activity change and appetite change.  HENT: Negative for congestion and rhinorrhea.   Eyes: Negative for visual disturbance.  Respiratory: Positive for chest tightness and shortness of breath.   Cardiovascular: Positive for chest pain.  Gastrointestinal: Negative for nausea, vomiting and abdominal pain.  Genitourinary: Negative for dysuria, hematuria and testicular pain.  Musculoskeletal: Negative for back pain.  Skin: Negative for rash.  Neurological: Negative for dizziness, weakness and headaches.  A complete 10 system review of systems was obtained and all systems are negative except as noted in the HPI and PMH.    Allergies  Shellfish allergy  Home Medications   No current outpatient prescriptions on file.  BP 153/97  Pulse 75  Temp(Src) 98.2 F (36.8 C) (Oral)  Resp 18  Ht 5\' 11"  (1.803 m)  Wt 183 lb 10.3 oz (83.3 kg)  BMI 25.62 kg/m2  SpO2 100%  Physical Exam  Constitutional: He is oriented to person, place, and time. He appears well-developed and well-nourished. He appears distressed.  HENT:  Head: Normocephalic and atraumatic.  Mouth/Throat: Oropharynx is clear and moist. No oropharyngeal exudate.  Eyes: Conjunctivae and EOM are normal. Pupils are equal, round, and reactive to light.  Neck: Normal range of motion. Neck supple.  Cardiovascular: Normal rate, regular rhythm and normal heart sounds.   No murmur heard. Pulmonary/Chest: Effort normal and breath sounds normal. No respiratory distress. He exhibits tenderness.  L Chest wall tenderness  Abdominal: Soft. There is no  tenderness. There is no rebound and no guarding.  Musculoskeletal: Normal range of motion. He exhibits no edema and no tenderness.  Equal grip strengths  Neurological: He is alert and oriented to person, place, and time. No cranial nerve deficit. He exhibits normal muscle tone. Coordination normal.  Skin: Skin is warm.    ED Course  Procedures (including critical care time)  Labs Reviewed  CBC WITH DIFFERENTIAL - Abnormal; Notable for the following:    Monocytes Relative 13 (*)    All other components within normal limits  COMPREHENSIVE METABOLIC PANEL - Abnormal; Notable for the following:    Creatinine, Ser 1.36 (*)    Total Bilirubin 0.2 (*)    GFR calc non Af Amer 56 (*)    GFR calc Af Amer 65 (*)    All other components within normal limits  URINE RAPID DRUG SCREEN (HOSP PERFORMED) - Abnormal; Notable for the following:    Opiates POSITIVE (*)    Cocaine POSITIVE (*)    All other components within normal limits  COMPREHENSIVE METABOLIC PANEL - Abnormal; Notable for the following:    Glucose, Bld 119 (*)    Albumin 3.2 (*)    GFR calc non Af Amer 61 (*)    GFR calc Af Amer 70 (*)    All other components within normal limits  CREATININE, SERUM - Abnormal; Notable for the following:    GFR calc non Af Amer 59 (*)    GFR calc Af Amer 68 (*)    All other components within normal limits  TROPONIN I  URINALYSIS, ROUTINE W REFLEX MICROSCOPIC  PROTIME-INR  D-DIMER, QUANTITATIVE  TROPONIN I  CBC  TROPONIN I  TROPONIN I   Dg Chest Portable 1 View  01/15/2013  *RADIOLOGY REPORT*  Clinical Data: Cold symptoms.  Sudden onset chest pain.  PORTABLE CHEST - 1 VIEW  Comparison: 05/13/2012.  Findings: Low volume chest.  Basilar atelectasis.  No airspace disease.  No effusion.  Cardiopericardial silhouette appears within normal limits allowing for volumes. Right greater than left AC joint osteoarthritis. Monitoring leads are projected over the chest.  IMPRESSION: Low volume chest.    Original Report Authenticated By: Andreas Newport, M.D.      1. Chest pain   2. Gout, unspecified   3. Polysubstance abuse   4. Pure hypercholesterolemia       MDM  Left-sided crushing chest pain similar to previous MI. History of cocaine abuse. EKG without acute changes. Patient is uncomfortable. He is given aspirin, nitroglycerin and morphine. Will check bilateral upper extremity blood pressures.  EKG unchanged from previous. Troponin negative. D-dimer negative.  Chest x-ray clear. Patient had a catheterization in 2011 which showed 50% LAD lesion. He  also has had an MI related to cocaine use in the past. Cocaine screen is positive today. His pain has been relieved with nitroglycerin and morphine. He will be admitted for cycling of his cardiac markers.    Date: 01/14/2013  Rate: 100  Rhythm: normal sinus rhythm  QRS Axis: normal  Intervals: normal  ST/T Wave abnormalities: normal  Conduction Disutrbances:none  Narrative Interpretation:   Old EKG Reviewed: unchanged      Glynn Octave, MD 01/15/13 [phone]

## 2013-01-14 NOTE — ED Notes (Signed)
Pt states he has had a cough x 2 days, states chest pain hurts worse when coughing.

## 2013-01-15 ENCOUNTER — Encounter (HOSPITAL_COMMUNITY): Payer: Self-pay | Admitting: Internal Medicine

## 2013-01-15 DIAGNOSIS — F1910 Other psychoactive substance abuse, uncomplicated: Secondary | ICD-10-CM

## 2013-01-15 DIAGNOSIS — E7800 Pure hypercholesterolemia, unspecified: Secondary | ICD-10-CM

## 2013-01-15 DIAGNOSIS — M109 Gout, unspecified: Secondary | ICD-10-CM

## 2013-01-15 DIAGNOSIS — R079 Chest pain, unspecified: Secondary | ICD-10-CM

## 2013-01-15 DIAGNOSIS — I10 Essential (primary) hypertension: Secondary | ICD-10-CM

## 2013-01-15 DIAGNOSIS — E785 Hyperlipidemia, unspecified: Secondary | ICD-10-CM

## 2013-01-15 DIAGNOSIS — I517 Cardiomegaly: Secondary | ICD-10-CM

## 2013-01-15 HISTORY — DX: Other psychoactive substance abuse, uncomplicated: F19.10

## 2013-01-15 LAB — CBC
HCT: 47 % (ref 39.0–52.0)
MCV: 89.2 fL (ref 78.0–100.0)
Platelets: 199 10*3/uL (ref 150–400)
RBC: 5.27 MIL/uL (ref 4.22–5.81)
WBC: 4.6 10*3/uL (ref 4.0–10.5)

## 2013-01-15 LAB — URINALYSIS, ROUTINE W REFLEX MICROSCOPIC
Glucose, UA: NEGATIVE mg/dL
Hgb urine dipstick: NEGATIVE
Ketones, ur: NEGATIVE mg/dL
Leukocytes, UA: NEGATIVE
Nitrite: NEGATIVE
Protein, ur: NEGATIVE mg/dL
Specific Gravity, Urine: 1.026 (ref 1.005–1.030)

## 2013-01-15 LAB — COMPREHENSIVE METABOLIC PANEL
ALT: 20 U/L (ref 0–53)
AST: 20 U/L (ref 0–37)
Albumin: 3.2 g/dL — ABNORMAL LOW (ref 3.5–5.2)
BUN: 14 mg/dL (ref 6–23)
Calcium: 9.8 mg/dL (ref 8.4–10.5)
Creatinine, Ser: 1.28 mg/dL (ref 0.50–1.35)
Sodium: 140 mEq/L (ref 135–145)
Total Bilirubin: 0.3 mg/dL (ref 0.3–1.2)
Total Protein: 7.1 g/dL (ref 6.0–8.3)
Total Protein: 6.7 g/dL (ref 6.0–8.3)

## 2013-01-15 LAB — CREATININE, SERUM
GFR calc Af Amer: 68 mL/min — ABNORMAL LOW (ref >90)
GFR calc non Af Amer: 59 mL/min — ABNORMAL LOW (ref >90)

## 2013-01-15 LAB — CBC WITH DIFFERENTIAL/PLATELET
Basophils Absolute: 0 10*3/uL (ref 0.0–0.1)
Eosinophils Absolute: 0.1 10*3/uL (ref 0.0–0.7)
Eosinophils Relative: 1 % (ref 0–5)
Lymphocytes Relative: 35 % (ref 12–46)
MCH: 30 pg (ref 26.0–34.0)
MCV: 87.9 fL (ref 78.0–100.0)
Platelets: 212 10*3/uL (ref 150–400)
RDW: 15.2 % (ref 11.5–15.5)
WBC: 5.5 10*3/uL (ref 4.0–10.5)

## 2013-01-15 LAB — RAPID URINE DRUG SCREEN, HOSP PERFORMED
Amphetamines: NOT DETECTED
Barbiturates: NOT DETECTED
Opiates: POSITIVE — AB
Tetrahydrocannabinol: NOT DETECTED

## 2013-01-15 LAB — TROPONIN I
Troponin I: <0.3 ng/mL (ref <0.30)
Troponin I: <0.3 ng/mL (ref <0.30)

## 2013-01-15 MED ORDER — LORAZEPAM 2 MG/ML IJ SOLN: 1.0000 mg | Freq: Four times a day (QID) | INTRAMUSCULAR | Status: DC | PRN

## 2013-01-15 MED ORDER — ALLOPURINOL 300 MG PO TABS
300.0000 mg | ORAL_TABLET | Freq: Every day | ORAL | Status: DC
  Administered 2013-01-15 – 2013-01-16 (×2): 300 mg via ORAL
  Filled 2013-01-15 (×2): qty 1

## 2013-01-15 MED ORDER — ADULT MULTIVITAMIN W/MINERALS CH
1.0000 | ORAL_TABLET | Freq: Every day | ORAL | Status: DC
  Administered 2013-01-15 – 2013-01-16 (×2): 1 via ORAL
  Filled 2013-01-15 (×2): qty 1

## 2013-01-15 MED ORDER — ENOXAPARIN SODIUM 40 MG/0.4ML ~~LOC~~ SOLN
40.0000 mg | SUBCUTANEOUS | Status: DC
  Administered 2013-01-15 – 2013-01-16 (×2): 40 mg via SUBCUTANEOUS
  Filled 2013-01-15 (×2): qty 0.4

## 2013-01-15 MED ORDER — ASPIRIN EC 325 MG PO TBEC: 325.0000 mg | DELAYED_RELEASE_TABLET | Freq: Every day | ORAL | Status: DC

## 2013-01-15 MED ORDER — ONDANSETRON HCL 4 MG/2ML IJ SOLN: 4.0000 mg | Freq: Four times a day (QID) | INTRAMUSCULAR | Status: DC | PRN

## 2013-01-15 MED ORDER — LORAZEPAM 1 MG PO TABS
0.0000 mg | ORAL_TABLET | Freq: Four times a day (QID) | ORAL | Status: DC
  Administered 2013-01-15: 1 mg via ORAL
  Administered 2013-01-16: 2 mg via ORAL

## 2013-01-15 MED ORDER — ONDANSETRON HCL 4 MG PO TABS: 4.0000 mg | ORAL_TABLET | Freq: Four times a day (QID) | ORAL | Status: DC | PRN

## 2013-01-15 MED ORDER — INFLUENZA VIRUS VACC SPLIT PF IM SUSP
0.5000 mL | INTRAMUSCULAR | Status: AC
  Administered 2013-01-16: 0.5 mL via INTRAMUSCULAR
  Filled 2013-01-15 (×2): qty 0.5

## 2013-01-15 MED ORDER — ACETAMINOPHEN 650 MG RE SUPP: 650.0000 mg | Freq: Four times a day (QID) | RECTAL | Status: DC | PRN

## 2013-01-15 MED ORDER — THIAMINE HCL 100 MG/ML IJ SOLN
100.0000 mg | Freq: Every day | INTRAMUSCULAR | Status: DC
  Filled 2013-01-15 (×2): qty 1

## 2013-01-15 MED ORDER — NITROGLYCERIN 0.4 MG/HR TD PT24
0.4000 mg | MEDICATED_PATCH | Freq: Every day | TRANSDERMAL | Status: DC
  Administered 2013-01-15 – 2013-01-16 (×2): 0.4 mg via TRANSDERMAL
  Filled 2013-01-15 (×2): qty 1

## 2013-01-15 MED ORDER — LORAZEPAM 1 MG PO TABS
1.0000 mg | ORAL_TABLET | Freq: Four times a day (QID) | ORAL | Status: DC | PRN
  Filled 2013-01-15: qty 1
  Filled 2013-01-15: qty 2

## 2013-01-15 MED ORDER — LOSARTAN POTASSIUM 50 MG PO TABS
50.0000 mg | ORAL_TABLET | Freq: Every day | ORAL | Status: DC
  Administered 2013-01-15 – 2013-01-16 (×2): 50 mg via ORAL
  Filled 2013-01-15 (×2): qty 1

## 2013-01-15 MED ORDER — GUAIFENESIN-DM 100-10 MG/5ML PO SYRP
10.0000 mL | ORAL_SOLUTION | ORAL | Status: DC | PRN
  Administered 2013-01-15: 10 mL via ORAL
  Filled 2013-01-15: qty 10

## 2013-01-15 MED ORDER — FOLIC ACID 1 MG PO TABS
1.0000 mg | ORAL_TABLET | Freq: Every day | ORAL | Status: DC
  Administered 2013-01-15 – 2013-01-16 (×2): 1 mg via ORAL
  Filled 2013-01-15 (×2): qty 1

## 2013-01-15 MED ORDER — ASPIRIN EC 325 MG PO TBEC
325.0000 mg | DELAYED_RELEASE_TABLET | Freq: Every day | ORAL | Status: DC
  Administered 2013-01-15 – 2013-01-16 (×2): 325 mg via ORAL
  Filled 2013-01-15 (×2): qty 1

## 2013-01-15 MED ORDER — SODIUM CHLORIDE 0.9 % IV SOLN
INTRAVENOUS | Status: DC
  Administered 2013-01-15: 02:00:00 via INTRAVENOUS

## 2013-01-15 MED ORDER — MORPHINE SULFATE 2 MG/ML IJ SOLN
1.0000 mg | Freq: Once | INTRAMUSCULAR | Status: AC
  Administered 2013-01-15: 1 mg via INTRAVENOUS
  Filled 2013-01-15: qty 1

## 2013-01-15 MED ORDER — CYCLOBENZAPRINE HCL 10 MG PO TABS
10.0000 mg | ORAL_TABLET | Freq: Three times a day (TID) | ORAL | Status: DC | PRN
  Filled 2013-01-15: qty 1

## 2013-01-15 MED ORDER — MORPHINE SULFATE 2 MG/ML IJ SOLN
1.0000 mg | INTRAMUSCULAR | Status: DC | PRN
  Administered 2013-01-15 – 2013-01-16 (×4): 2 mg via INTRAVENOUS
  Filled 2013-01-15 (×4): qty 1

## 2013-01-15 MED ORDER — SIMVASTATIN 20 MG PO TABS
20.0000 mg | ORAL_TABLET | Freq: Every day | ORAL | Status: DC
  Administered 2013-01-15: 20 mg via ORAL
  Filled 2013-01-15 (×2): qty 1

## 2013-01-15 MED ORDER — ONDANSETRON HCL 4 MG/2ML IJ SOLN: 4.0000 mg | Freq: Three times a day (TID) | INTRAMUSCULAR | Status: DC | PRN

## 2013-01-15 MED ORDER — LORAZEPAM 1 MG PO TABS: 0.0000 mg | ORAL_TABLET | Freq: Two times a day (BID) | ORAL | Status: DC

## 2013-01-15 MED ORDER — SODIUM CHLORIDE 0.9 % IV SOLN
INTRAVENOUS | Status: AC
  Administered 2013-01-15 (×3): via INTRAVENOUS

## 2013-01-15 MED ORDER — SODIUM CHLORIDE 0.9 % IJ SOLN
3.0000 mL | Freq: Two times a day (BID) | INTRAMUSCULAR | Status: DC
  Administered 2013-01-15 – 2013-01-16 (×2): 3 mL via INTRAVENOUS

## 2013-01-15 MED ORDER — PNEUMOCOCCAL VAC POLYVALENT 25 MCG/0.5ML IJ INJ
0.5000 mL | INJECTION | INTRAMUSCULAR | Status: AC
  Administered 2013-01-16: 0.5 mL via INTRAMUSCULAR
  Filled 2013-01-15 (×2): qty 0.5

## 2013-01-15 MED ORDER — ACETAMINOPHEN 325 MG PO TABS
650.0000 mg | ORAL_TABLET | Freq: Four times a day (QID) | ORAL | Status: DC | PRN
  Administered 2013-01-16: 650 mg via ORAL
  Filled 2013-01-15: qty 2

## 2013-01-15 MED ORDER — VITAMIN B-1 100 MG PO TABS
100.0000 mg | ORAL_TABLET | Freq: Every day | ORAL | Status: DC
  Administered 2013-01-15 – 2013-01-16 (×2): 100 mg via ORAL
  Filled 2013-01-15 (×2): qty 1

## 2013-01-15 NOTE — Progress Notes (Signed)
Patient's niece, Loralie Champagne, called asking about pt condition. Spoke with patient and he said it was okay to give her medical information about his condition and hospitalization. Explained HIPPA to patient and his rights, he doesn't wish to set up a HIPPA password but does state Loralie Champagne can be updated about his medical condition and hospitalization.  Earnest Conroy. Clelia Croft, RN

## 2013-01-15 NOTE — Progress Notes (Signed)
I have seen and assessed patient and agree with Dr Katherene Ponto assessment and plan. Cardiology consult pending. 2 d echo pending.

## 2013-01-15 NOTE — ED Notes (Signed)
Admitting MD at bedside.

## 2013-01-15 NOTE — Progress Notes (Signed)
  Echocardiogram 2D Echocardiogram has been performed.  Cathie Beams 01/15/2013, 9:51 AM

## 2013-01-15 NOTE — H&P (Signed)
[facility] History and Physical  Russell Carter ZOX:[id number] DOB: 1956/09/27 DOA: 01/14/2013  Referring physician: Dr. Manus Gunning. PCP: Provider Not In System  Specialists: Dr. Eden Emms cardiologist.  Chief Complaint: Chest pain.  HPI: Russell Carter is a 57 y.o. male history of hypertension, hyperlipidemia, TIA and ongoing tobacco abuse and alcohol abuse presented with complaints of chest pain. Patient's chest pain started last night around 10 PM while at work. Pain was left-sided anterior chest wall nonradiating present with mild shortness of breath. The pain improved with nitroglycerin sublingual and morphine in the ER. Cardiac enzymes chest x-ray and EKG are unremarkable. 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 At this time patient has been admitted for further management. Denies any nausea vomiting abdominal pain.  Review of Systems: As presented in the history of presenting illness, rest negative.  Past Medical History  Diagnosis Date  . Hyperglycemia   . Gout, unspecified   . Heart block   . Hypercholesterolemia   . Chest pain   . Hyperlipidemia   . HTN (hypertension)   . Muscle spasm   . Diabetes mellitus   . Chest pain   . Abdominal pain   . Rt groin pain   . Blood in stool   . Migraines   . Stroke   . Arthritis   . Pneumonia   . Depression   . Myocardial infarction     drug cocaine and marijuana at time of mi none  since    . Cocaine abuse     history   Past Surgical History  Procedure Laterality Date  . Reapir of left inguinal hernia  02/02/2003    Dr. Violeta Gelinas. repair with mesh. Same procedure done again in  05/22/2004   . Rotator cuff repair  2010    left   . Cardiac catheterization  2008    [place]-40%lad  . Reconstruct / stabilize distal ulna    . Inguinal hernia repair  05/12/2012    Procedure: HERNIA REPAIR INGUINAL ADULT;  Surgeon: Liz Malady, MD;   Location: [facility];  Service: General;  Laterality: Right;  . Hernia repair     Social History:  reports that he has never smoked. He has never used smokeless tobacco. He reports that he drinks about 4.2 ounces of alcohol per week. He reports that he uses illicit drugs ("Crack" cocaine and Marijuana). Lives at home. where does patient live-- Can do ADLs. Can patient participate in ADLs?  Allergies  Allergen Reactions  . Shellfish Allergy Anaphylaxis    Family History  Problem Relation Age of Onset  . Heart attack Mother     x7. Still alive  . Heart attack Father     deceased b/c of MI  . Heart attack Brother     older, deceased b/c of MI       Prior to Admission medications   Medication Sig Start Date End Date Taking? Authorizing Provider  allopurinol (ZYLOPRIM) 300 MG tablet Take 1 tablet (300 mg total) by mouth daily. 08/12/12  Yes Wendall Stade, MD  aspirin EC 325 MG tablet Take 325 mg by mouth daily.     Yes Historical Provider, MD  CALCIUM-VITAMIN D PO Take 1 tablet by mouth daily.   Yes Historical Provider, MD  cyclobenzaprine (FLEXERIL) 10 MG tablet Take 10 mg by mouth 3 (three) times daily as needed. For muscle spasms  Yes Historical Provider, MD  hydrochlorothiazide (HYDRODIURIL) 25 MG tablet Take 25 mg by mouth daily.    Yes Historical Provider, MD  ibuprofen (ADVIL,MOTRIN) 200 MG tablet Take 800 mg by mouth every 6 (six) hours as needed. For pain   Yes Historical Provider, MD  indomethacin (INDOCIN) 25 MG capsule Take 25 mg by mouth 3 (three) times daily with meals.    Yes Historical Provider, MD  Iron-Vitamins (GERITOL PO) Take 2 tablets by mouth daily.   Yes Historical Provider, MD  losartan (COZAAR) 50 MG tablet Take 1 tablet (50 mg total) by mouth daily. 05/13/12 05/13/13 Yes Liz Malady, MD  meclizine (ANTIVERT) 25 MG tablet Take 25 mg by mouth 3 (three) times daily as needed. For vertigo   Yes Historical Provider, MD  pravastatin (PRAVACHOL) 40  MG tablet Take 1 tablet (40 mg total) by mouth daily. 05/13/12  Yes Liz Malady, MD   Physical Exam: Filed Vitals:   01/14/13 2333 01/15/13 0107  BP: 158/94 143/91  Pulse: 102 101  Temp: 98.6 F (37 C)   TempSrc: Oral   Resp:  17  SpO2: 99% 100%     General:  Well-developed well-nourished.  Eyes: Anicteric no pallor.  ENT: No discharge from ears eyes nose or mouth.  Neck: No mass felt.  Cardiovascular: S1-S2 heard tachycardic.  Respiratory: No rhonchi or crepitations.  Abdomen: Soft nontender bowel sounds present.  Skin: No rash.  Musculoskeletal: No edema.  Psychiatric: Appears normal.  Neurologic: Alert awake oriented to time place and person moves all extremities.  Labs on Admission:  Basic Metabolic Panel:  Recent Labs Lab 01/14/13 2344  NA 140  K 4.0  CL 103  CO2 22  GLUCOSE 94  BUN 12  CREATININE 1.36*  CALCIUM 9.8   Liver Function Tests:  Recent Labs Lab 01/14/13 2344  AST 20  ALT 20  ALKPHOS 42  BILITOT 0.2*  PROT 7.1  ALBUMIN 3.5   No results found for this basename: LIPASE, AMYLASE,  in the last 168 hours No results found for this basename: AMMONIA,  in the last 168 hours CBC:  Recent Labs Lab 01/14/13 2344  WBC 5.5  NEUTROABS 2.8  HGB 16.8  HCT 49.2  MCV 87.9  PLT 212   Cardiac Enzymes:  Recent Labs Lab 01/14/13 2344  TROPONINI <0.30    BNP (last 3 results) No results found for this basename: PROBNP,  in the last 8760 hours CBG: No results found for this basename: GLUCAP,  in the last 168 hours  Radiological Exams on Admission: Dg Chest Portable 1 View  01/15/2013  *RADIOLOGY REPORT*  Clinical Data: Cold symptoms.  Sudden onset chest pain.  PORTABLE CHEST - 1 VIEW  Comparison: 05/13/2012.  Findings: Low volume chest.  Basilar atelectasis.  No airspace disease.  No effusion.  Cardiopericardial silhouette appears within normal limits allowing for volumes. Right greater than left AC joint osteoarthritis.  Monitoring leads are projected over the chest.  IMPRESSION: Low volume chest.   Original Report Authenticated By: Andreas Newport, M.D.     EKG: Independently reviewed. Sinus tachycardia.  Assessment/Plan Principal Problem:   CHEST PAIN Active Problems:   HYPERCHOLESTEROLEMIA   Gout, unspecified   HYPERTENSION   Polysubstance abuse   1. Chest pain - at this time we will cycle cardiac markers. Aspirin. Nitroglycerin patch. Patient's d-dimer has been negative. 2. Hypertension - continue home medications except for HCTZ. 3. Renal failure probably chronic - closely follow metabolic panel and intake  output. At this time gently hydrating patient. 4. Hyperlipidemia - continue statins. 5. Polysubstance abuse including cocaine and alcohol - patient had used cocaine 5 days ago and recently has been drinking alcohol everyday. Patient has been strongly advised to quit these habits. Patient will be placed on CIWA protocol. 6. Gout - continue allopurinol presently holding off indomethacin.    Code Status: Full code.  Family Communication: None.  Disposition Plan: Admit for observation.    Otillia Cordone N. [facility] Pager [phone].  If 7PM-7AM, please contact night-coverage [email or web address] Password [facility] 01/15/2013, 2:23 AM

## 2013-01-15 NOTE — Care Management Note (Addendum)
    Page 1 of 1   01/16/2013     3:48:20 PM   CARE MANAGEMENT NOTE 01/16/2013  Patient:  BRICESON, BROADWATER   Account Number:  [id number]  Date Initiated:  01/15/2013  Documentation initiated by:  Lanier Clam  Subjective/Objective Assessment:   ADMITTED W/CHEST PAIN.ETOH ABUSE/TOBACCO ABUSE.     Action/Plan:   FROM HOME   Anticipated DC Date:  01/17/2013   Anticipated DC Plan:  HOME/SELF CARE  In-house referral  PCP / [facility]      DC Planning Services  CM consult      Choice offered to / List presented to:             Status of service:  Completed, signed off Medicare Important Message given?   (If response is "NO", the following Medicare IM given date fields will be blank) Date Medicare IM given:   Date Additional Medicare IM given:    Discharge Disposition:  HOME/SELF CARE  Per UR Regulation:  Reviewed for med. necessity/level of care/duration of stay  If discussed at Long Length of Stay Meetings, dates discussed:    Comments:  01/15/13 Yarethzi Branan RN,BSN NCM [phone] WILL PROVIDE $4 [facility] MED LIST, PCP LISTING,& COMMUNITY RESOURCES.

## 2013-01-15 NOTE — ED Notes (Signed)
Pt states last time he did cocaine was last Friday, denies having chest pain last Friday after doing the cocaine.

## 2013-01-15 NOTE — Consult Note (Signed)
HPI: 57 year old male with past medical history of coronary artery disease, hypertension, hyperlipidemia, borderline diabetes mellitus for evaluation of chest pain. The patient had a myocardial infarction previously in the setting of cocaine use. Echocardiogram in 2011 showed normal LV function. Cardiac catheterization in 2011 showed a 50% mid LAD. Patient states he did cocaine in approximately one week ago. He was at work last evening and at approximately 10 PM developed pain in the left breast area. It was described as a squeezing pain. It did not radiate. It increased with turning to the right, inspiration and palpation. Some nausea and shortness of breath but no diaphoresis. He has been admitted in his chest pain has been persistent. Cardiology is asked to evaluate.  Medications Prior to Admission  Medication Sig Dispense Refill  . allopurinol (ZYLOPRIM) 300 MG tablet Take 1 tablet (300 mg total) by mouth daily.  30 tablet  6  . aspirin EC 325 MG tablet Take 325 mg by mouth daily.        Marland Kitchen CALCIUM-VITAMIN D PO Take 1 tablet by mouth daily.      . cyclobenzaprine (FLEXERIL) 10 MG tablet Take 10 mg by mouth 3 (three) times daily as needed. For muscle spasms      . hydrochlorothiazide (HYDRODIURIL) 25 MG tablet Take 25 mg by mouth daily.       Marland Kitchen ibuprofen (ADVIL,MOTRIN) 200 MG tablet Take 800 mg by mouth every 6 (six) hours as needed. For pain      . indomethacin (INDOCIN) 25 MG capsule Take 25 mg by mouth 3 (three) times daily with meals.       . Iron-Vitamins (GERITOL PO) Take 2 tablets by mouth daily.      Marland Kitchen losartan (COZAAR) 50 MG tablet Take 1 tablet (50 mg total) by mouth daily.  30 tablet  2  . meclizine (ANTIVERT) 25 MG tablet Take 25 mg by mouth 3 (three) times daily as needed. For vertigo      . pravastatin (PRAVACHOL) 40 MG tablet Take 1 tablet (40 mg total) by mouth daily.  30 tablet  2    Allergies  Allergen Reactions  . Shellfish Allergy Anaphylaxis    Past Medical History    Diagnosis Date  . Hyperglycemia   . Gout, unspecified   . Hypercholesterolemia   . HTN (hypertension)   . Blood in stool   . Migraines   . Stroke   . Arthritis   . Pneumonia   . Depression   . Myocardial infarction     drug cocaine and marijuana at time of mi none  since    . Cocaine abuse     history    Past Surgical History  Procedure Laterality Date  . Reapir of left inguinal hernia  02/02/2003    Dr. Violeta Gelinas. repair with mesh. Same procedure done again in  05/22/2004   . Rotator cuff repair  2010    left   . Cardiac catheterization  2008    [place]-40%lad  . Reconstruct / stabilize distal ulna    . Inguinal hernia repair  05/12/2012    Procedure: HERNIA REPAIR INGUINAL ADULT;  Surgeon: Liz Malady, MD;  Location: [facility];  Service: General;  Laterality: Right;  . Hernia repair      History   Social History  . Marital Status: Married    Spouse Name: N/A    Number of Children: N/A  . Years of Education: N/A   Occupational History  .  Not on file.   Social History Main Topics  . Smoking status: Never Smoker   . Smokeless tobacco: Never Used  . Alcohol Use: 4.2 oz/week    7 Cans of beer per week     Comment: occasional beer  . Drug Use: Yes    Special: "Crack" cocaine, Marijuana     Comment: occasional marijuana, cocaine use  . Sexually Active: Not on file   Other Topics Concern  . Not on file   Social History Narrative   Lives in [place] with his wife and son.     Family History  Problem Relation Age of Onset  . Heart attack Mother     x7. Still alive  . Heart attack Father     deceased b/c of MI  . Heart attack Brother     older, deceased b/c of MI     ROS:  Hematochezia but no fevers or chills, productive cough, hemoptysis, dysphasia, odynophagia, melena, dysuria, hematuria, rash, seizure activity, orthopnea, PND, pedal edema, claudication. Remaining systems are negative.  Physical Exam:   Blood pressure  146/96, pulse 83, temperature 98 F (36.7 C), temperature source Oral, resp. rate 18, height 5\' 11"  (1.803 m), weight 183 lb 10.3 oz (83.3 kg), SpO2 100.00%.  General:  Well developed/well nourished in NAD Skin warm/dry Patient not depressed No peripheral clubbing Back-normal HEENT-normal/normal eyelids Neck supple/normal carotid upstroke bilaterally; no bruits; no JVD; no thyromegaly chest - CTA/ normal expansion, some reproduction of chest pain with palpation. CV - RRR/normal S1 and S2; no murmurs, rubs or gallops;  PMI nondisplaced Abdomen -NT/ND, no HSM, no mass, + bowel sounds, no bruit 2+ femoral pulses, no bruits Ext-no edema, chords, 2+ DP Neuro-grossly nonfocal  ECG sinus rhythm with no ST changes.  Results for orders placed during the hospital encounter of 01/14/13 (from the past 48 hour(s))  CBC WITH DIFFERENTIAL     Status: Abnormal   Collection Time    01/14/13 11:44 PM      Result Value Range   WBC 5.5  4.0 - 10.5 K/uL   RBC 5.60  4.22 - 5.81 MIL/uL   Hemoglobin 16.8  13.0 - 17.0 g/dL   HCT 16.1  09.6 - 04.5 %   MCV 87.9  78.0 - 100.0 fL   MCH 30.0  26.0 - 34.0 pg   MCHC 34.1  30.0 - 36.0 g/dL   RDW 40.9  81.1 - 91.4 %   Platelets 212  150 - 400 K/uL   Neutrophils Relative 51  43 - 77 %   Neutro Abs 2.8  1.7 - 7.7 K/uL   Lymphocytes Relative 35  12 - 46 %   Lymphs Abs 1.9  0.7 - 4.0 K/uL   Monocytes Relative 13 (*) 3 - 12 %   Monocytes Absolute 0.7  0.1 - 1.0 K/uL   Eosinophils Relative 1  0 - 5 %   Eosinophils Absolute 0.1  0.0 - 0.7 K/uL   Basophils Relative 1  0 - 1 %   Basophils Absolute 0.0  0.0 - 0.1 K/uL  COMPREHENSIVE METABOLIC PANEL     Status: Abnormal   Collection Time    01/14/13 11:44 PM      Result Value Range   Sodium 140  135 - 145 mEq/L   Potassium 4.0  3.5 - 5.1 mEq/L   Chloride 103  96 - 112 mEq/L   CO2 22  19 - 32 mEq/L   Glucose, Bld 94  70 -  99 mg/dL   BUN 12  6 - 23 mg/dL   Creatinine, Ser 4.78 (*) 0.50 - 1.35 mg/dL   Calcium  9.8  8.4 - 29.5 mg/dL   Total Protein 7.1  6.0 - 8.3 g/dL   Albumin 3.5  3.5 - 5.2 g/dL   AST 20  0 - 37 U/L   ALT 20  0 - 53 U/L   Alkaline Phosphatase 42  39 - 117 U/L   Total Bilirubin 0.2 (*) 0.3 - 1.2 mg/dL   GFR calc non Af Amer 56 (*) >90 mL/min   GFR calc Af Amer 65 (*) >90 mL/min   Comment:            The eGFR has been calculated     using the CKD EPI equation.     This calculation has not been     validated in all clinical     situations.     eGFR's persistently     <90 mL/min signify     possible Chronic Kidney Disease.  TROPONIN I     Status: None   Collection Time    01/14/13 11:44 PM      Result Value Range   Troponin I <0.30  <0.30 ng/mL   Comment:            Due to the release kinetics of cTnI,     a negative result within the first hours     of the onset of symptoms does not rule out     myocardial infarction with certainty.     If myocardial infarction is still suspected,     repeat the test at appropriate intervals.  PROTIME-INR     Status: None   Collection Time    01/14/13 11:44 PM      Result Value Range   Prothrombin Time 13.5  11.6 - 15.2 seconds   INR 1.04  0.00 - 1.49  D-DIMER, QUANTITATIVE     Status: None   Collection Time    01/14/13 11:44 PM      Result Value Range   D-Dimer, Quant 0.32  0.00 - 0.48 ug/mL-FEU   Comment:            AT THE INHOUSE ESTABLISHED CUTOFF     VALUE OF 0.48 ug/mL FEU,     THIS ASSAY HAS BEEN DOCUMENTED     IN THE LITERATURE TO HAVE     A SENSITIVITY AND NEGATIVE     PREDICTIVE VALUE OF AT LEAST     98 TO 99%.  THE TEST RESULT     SHOULD BE CORRELATED WITH     AN ASSESSMENT OF THE CLINICAL     PROBABILITY OF DVT / VTE.  URINALYSIS, ROUTINE W REFLEX MICROSCOPIC     Status: None   Collection Time    01/15/13  1:33 AM      Result Value Range   Color, Urine YELLOW  YELLOW   APPearance CLEAR  CLEAR   Specific Gravity, Urine 1.026  1.005 - 1.030   pH 5.5  5.0 - 8.0   Glucose, UA NEGATIVE  NEGATIVE mg/dL   Hgb  urine dipstick NEGATIVE  NEGATIVE   Bilirubin Urine NEGATIVE  NEGATIVE   Ketones, ur NEGATIVE  NEGATIVE mg/dL   Protein, ur NEGATIVE  NEGATIVE mg/dL   Urobilinogen, UA 1.0  0.0 - 1.0 mg/dL   Nitrite NEGATIVE  NEGATIVE   Leukocytes, UA NEGATIVE  NEGATIVE   Comment: MICROSCOPIC NOT DONE  ON URINES WITH NEGATIVE PROTEIN, BLOOD, LEUKOCYTES, NITRITE, OR GLUCOSE <1000 mg/dL.  URINE RAPID DRUG SCREEN (HOSP PERFORMED)     Status: Abnormal   Collection Time    01/15/13  1:33 AM      Result Value Range   Opiates POSITIVE (*) NONE DETECTED   Cocaine POSITIVE (*) NONE DETECTED   Benzodiazepines NONE DETECTED  NONE DETECTED   Amphetamines NONE DETECTED  NONE DETECTED   Tetrahydrocannabinol NONE DETECTED  NONE DETECTED   Barbiturates NONE DETECTED  NONE DETECTED   Comment:            DRUG SCREEN FOR MEDICAL PURPOSES     ONLY.  IF CONFIRMATION IS NEEDED     FOR ANY PURPOSE, NOTIFY LAB     WITHIN 5 DAYS.                LOWEST DETECTABLE LIMITS     FOR URINE DRUG SCREEN     Drug Class       Cutoff (ng/mL)     Amphetamine      1000     Barbiturate      200     Benzodiazepine   200     Tricyclics       300     Opiates          300     Cocaine          300     THC              50  TROPONIN I     Status: None   Collection Time    01/15/13  3:17 AM      Result Value Range   Troponin I <0.30  <0.30 ng/mL   Comment:            Due to the release kinetics of cTnI,     a negative result within the first hours     of the onset of symptoms does not rule out     myocardial infarction with certainty.     If myocardial infarction is still suspected,     repeat the test at appropriate intervals.  COMPREHENSIVE METABOLIC PANEL     Status: Abnormal   Collection Time    01/15/13  3:17 AM      Result Value Range   Sodium 138  135 - 145 mEq/L   Potassium 3.6  3.5 - 5.1 mEq/L   Chloride 103  96 - 112 mEq/L   CO2 25  19 - 32 mEq/L   Glucose, Bld 119 (*) 70 - 99 mg/dL   BUN 14  6 - 23 mg/dL    Creatinine, Ser 1.61  0.50 - 1.35 mg/dL   Calcium 8.9  8.4 - 09.6 mg/dL   Total Protein 6.7  6.0 - 8.3 g/dL   Albumin 3.2 (*) 3.5 - 5.2 g/dL   AST 19  0 - 37 U/L   ALT 18  0 - 53 U/L   Alkaline Phosphatase 42  39 - 117 U/L   Total Bilirubin 0.3  0.3 - 1.2 mg/dL   GFR calc non Af Amer 61 (*) >90 mL/min   GFR calc Af Amer 70 (*) >90 mL/min   Comment:            The eGFR has been calculated     using the CKD EPI equation.     This calculation has not been     validated in  all clinical     situations.     eGFR's persistently     <90 mL/min signify     possible Chronic Kidney Disease.  CBC     Status: None   Collection Time    01/15/13  3:17 AM      Result Value Range   WBC 4.6  4.0 - 10.5 K/uL   RBC 5.27  4.22 - 5.81 MIL/uL   Hemoglobin 15.8  13.0 - 17.0 g/dL   HCT 57.8  46.9 - 62.9 %   MCV 89.2  78.0 - 100.0 fL   MCH 30.0  26.0 - 34.0 pg   MCHC 33.6  30.0 - 36.0 g/dL   RDW 52.8  41.3 - 24.4 %   Platelets 199  150 - 400 K/uL  CREATININE, SERUM     Status: Abnormal   Collection Time    01/15/13  3:17 AM      Result Value Range   Creatinine, Ser 1.31  0.50 - 1.35 mg/dL   GFR calc non Af Amer 59 (*) >90 mL/min   GFR calc Af Amer 68 (*) >90 mL/min   Comment:            The eGFR has been calculated     using the CKD EPI equation.     This calculation has not been     validated in all clinical     situations.     eGFR's persistently     <90 mL/min signify     possible Chronic Kidney Disease.  TROPONIN I     Status: None   Collection Time    01/15/13  8:13 AM      Result Value Range   Troponin I <0.30  <0.30 ng/mL   Comment:            Due to the release kinetics of cTnI,     a negative result within the first hours     of the onset of symptoms does not rule out     myocardial infarction with certainty.     If myocardial infarction is still suspected,     repeat the test at appropriate intervals.  TROPONIN I     Status: None   Collection Time    01/15/13  2:07 PM       Result Value Range   Troponin I <0.30  <0.30 ng/mL   Comment:            Due to the release kinetics of cTnI,     a negative result within the first hours     of the onset of symptoms does not rule out     myocardial infarction with certainty.     If myocardial infarction is still suspected,     repeat the test at appropriate intervals.    Dg Chest Portable 1 View  01/15/2013  *RADIOLOGY REPORT*  Clinical Data: Cold symptoms.  Sudden onset chest pain.  PORTABLE CHEST - 1 VIEW  Comparison: 05/13/2012.  Findings: Low volume chest.  Basilar atelectasis.  No airspace disease.  No effusion.  Cardiopericardial silhouette appears within normal limits allowing for volumes. Right greater than left AC joint osteoarthritis. Monitoring leads are projected over the chest.  IMPRESSION: Low volume chest.   Original Report Authenticated By: Andreas Newport, M.D.     Assessment/Plan 1 chest pain-patient symptoms are most consistent with musculoskeletal pain. They have been persistent for 15 hours without completely resolving. They increase with certain movements.  Electrocardiogram shows no ST changes. Multiple enzymes negative. Echocardiogram has been ordered by primary care. If LV function normal patient can be discharged and followup with Dr. Eden Emms. 2 coronary artery disease-continue aspirin and statin. 3 hypertension-continue preadmission blood pressure medications at discharge. 4 diabetes mellitus-management per primary care. 5 hyperlipidemia-management per primary care. Continue statin.  Olga Millers MD 01/15/2013, 3:19 PM

## 2013-01-16 LAB — BASIC METABOLIC PANEL
BUN: 12 mg/dL (ref 6–23)
Creatinine, Ser: 1.34 mg/dL (ref 0.50–1.35)
GFR calc Af Amer: 66 mL/min — ABNORMAL LOW (ref >90)
GFR calc non Af Amer: 57 mL/min — ABNORMAL LOW (ref >90)
Glucose, Bld: 143 mg/dL — ABNORMAL HIGH (ref 70–99)
Potassium: 3.4 mEq/L — ABNORMAL LOW (ref 3.5–5.1)

## 2013-01-16 LAB — CBC
Hemoglobin: 14 g/dL (ref 13.0–17.0)
MCH: 29.9 pg (ref 26.0–34.0)
MCHC: 33.3 g/dL (ref 30.0–36.0)
RDW: 15.1 % (ref 11.5–15.5)

## 2013-01-16 MED ORDER — POTASSIUM CHLORIDE CRYS ER 20 MEQ PO TBCR
40.0000 meq | EXTENDED_RELEASE_TABLET | Freq: Once | ORAL | Status: AC
  Administered 2013-01-16: 40 meq via ORAL
  Filled 2013-01-16: qty 2

## 2013-01-16 MED ORDER — NITROGLYCERIN 0.4 MG SL SUBL: 0.4000 mg | SUBLINGUAL_TABLET | SUBLINGUAL | Status: DC | PRN

## 2013-01-16 MED ORDER — IBUPROFEN 800 MG PO TABS: 800.0000 mg | ORAL_TABLET | Freq: Three times a day (TID) | ORAL | Status: DC | PRN

## 2013-01-16 MED ORDER — FOLIC ACID 1 MG PO TABS: 1.0000 mg | ORAL_TABLET | Freq: Every day | ORAL | Status: DC

## 2013-01-16 MED ORDER — THIAMINE HCL 100 MG PO TABS: 100.0000 mg | ORAL_TABLET | Freq: Every day | ORAL | Status: DC

## 2013-01-16 NOTE — Progress Notes (Signed)
TELEMETRY: Reviewed telemetry pt in NSR: Filed Vitals:   01/15/13 1306 01/15/13 1720 01/15/13 2152 01/16/13 0302  BP: 146/96 140/81 155/77 149/79  Pulse: 83 66 83 96  Temp: 98 F (36.7 C) 98 F (36.7 C) 98.1 F (36.7 C) 98.2 F (36.8 C)  TempSrc: Oral Oral Oral Oral  Resp: 18 18 18    Height:      Weight:      SpO2: 100% 99% 100%     Intake/Output Summary (Last 24 hours) at 01/16/13 0655 Last data filed at 01/16/13 0432  Gross per 24 hour  Intake   2795 ml  Output   1855 ml  Net    940 ml    SUBJECTIVE Resting/ sleeping soundly.  LABS: Basic Metabolic Panel:  Recent Labs  09/60/45 0317 01/16/13 0501  NA 138 136  K 3.6 3.4*  CL 103 103  CO2 25 21  GLUCOSE 119* 143*  BUN 14 12  CREATININE 1.28  1.31 1.34  CALCIUM 8.9 8.6   Liver Function Tests:  Recent Labs  01/14/13 2344 01/15/13 0317  AST 20 19  ALT 20 18  ALKPHOS 42 42  BILITOT 0.2* 0.3  PROT 7.1 6.7  ALBUMIN 3.5 3.2*   No results found for this basename: LIPASE, AMYLASE,  in the last 72 hours CBC:  Recent Labs  01/14/13 2344 01/15/13 0317 01/16/13 0501  WBC 5.5 4.6 5.3  NEUTROABS 2.8  --   --   HGB 16.8 15.8 14.0  HCT 49.2 47.0 42.1  MCV 87.9 89.2 89.8  PLT 212 199 161   Cardiac Enzymes:  Recent Labs  01/15/13 0317 01/15/13 0813 01/15/13 1407  TROPONINI <0.30 <0.30 <0.30   BNP: No components found with this basename: POCBNP,  D-Dimer:  Recent Labs  01/14/13 2344  DDIMER 0.32    Radiology/Studies:  Dg Chest Portable 1 View  01/15/2013  *RADIOLOGY REPORT*  Clinical Data: Cold symptoms.  Sudden onset chest pain.  PORTABLE CHEST - 1 VIEW  Comparison: 05/13/2012.  Findings: Low volume chest.  Basilar atelectasis.  No airspace disease.  No effusion.  Cardiopericardial silhouette appears within normal limits allowing for volumes. Right greater than left AC joint osteoarthritis. Monitoring leads are projected over the chest.  IMPRESSION: Low volume chest.   Original Report  Authenticated By: Andreas Newport, M.D.    Transthoracic Echocardiography  Patient: Russell Carter, Russell Carter MR #: [id number] Study Date: 01/15/2013 Gender: M Age: 57 Height: 180.3cm Weight: 83.3kg BSA: 2.36m^2 Pt. Status: Room: 1410  PERFORMING [facility] SONOGRAPHER Cathie Beams ATTENDING Benna Dunks, Daniel ADMITTING Samtani, Jai-Gurmukh cc:  ------------------------------------------------------------ LV EF: 60% - 65%  ------------------------------------------------------------ Indications: Previous study 01/22/2011. Chest pain 786.51.  ------------------------------------------------------------ History: PMH: Hyperglycemia. Polysubstance abuse. PMH: Myocardial infarction. Risk factors: Hypertension. Dyslipidemia.  ------------------------------------------------------------ Study Conclusions  - Left ventricle: The cavity size was normal. Wall thickness was increased in a pattern of mild LVH. There was mild focal basal hypertrophy of the septum. Systolic function was normal. The estimated ejection fraction was in the range of 60% to 65%. Wall motion was normal; there were no regional wall motion abnormalities. - Atrial septum: No defect or patent foramen ovale was identified. Transthoracic echocardiography. M-mode, complete 2D, spectral Doppler, and color Doppler. Height: Height: 180.3cm. Height: 71in. Weight: Weight: 83.3kg. Weight: 183.3lb. Body mass index: BMI: 25.6kg/m^2. Body surface area: BSA: 2.62m^2. Blood pressure: 153/97. Patient status: Inpatient. Location: Bedside.  ------------------------------------------------------------  ------------------------------------------------------------ Left ventricle: The cavity size was normal. Wall thickness was increased in  a pattern of mild LVH. There was mild focal basal hypertrophy of the septum. Systolic function was normal. The estimated ejection fraction was in the range of 60%  to 65%. Wall motion was normal; there were no regional wall motion abnormalities.  ------------------------------------------------------------ Aortic valve: Structurally normal valve. Cusp separation was normal. Doppler: Transvalvular velocity was within the normal range. There was no stenosis. No regurgitation.  ------------------------------------------------------------ Aorta: The aorta was normal, not dilated, and non-diseased.  ------------------------------------------------------------ Mitral valve: Structurally normal valve. Leaflet separation was normal. Doppler: Transvalvular velocity was within the normal range. There was no evidence for stenosis. No regurgitation. Peak gradient: 3mm Hg (D).  ------------------------------------------------------------ Left atrium: The atrium was normal in size.  ------------------------------------------------------------ Atrial septum: No defect or patent foramen ovale was identified.  ------------------------------------------------------------ Right ventricle: The cavity size was normal. Wall thickness was normal. Systolic function was normal.  ------------------------------------------------------------ Pulmonic valve: The valve appears to be grossly normal. Doppler: No significant regurgitation.  ------------------------------------------------------------ Tricuspid valve: Structurally normal valve. Leaflet separation was normal. Doppler: Transvalvular velocity was within the normal range. No significant regurgitation.  ------------------------------------------------------------ Right atrium: The atrium was normal in size.  ------------------------------------------------------------ Pericardium: The pericardium was normal in appearance.  ------------------------------------------------------------ Systemic veins: Inferior vena cava: The vessel was normal in size; the respirophasic diameter changes were in the normal  range (= 50%); findings are consistent with normal central venous pressure.  ------------------------------------------------------------ Post procedure conclusions Ascending Aorta:  - The aorta was normal, not dilated, and non-diseased.  ------------------------------------------------------------  2D measurements Normal Doppler measurements Normal Left ventricle Left ventricle LVID ED, 30 mm 43-52 Ea, lat ann, 11. cm/s ------ chord, tiss DP 7 PLAX E/Ea, lat 6.8 ------ LVID ES, 21 mm 23-38 ann, tiss DP 2 chord, Ea, med ann, 7.9 cm/s ------ PLAX tiss DP 9 FS, chord, 30 % >29 E/Ea, med 9.9 ------ PLAX ann, tiss DP 9 LVPW, ED 15 mm ------ Mitral valve IVS/LVPW 0.87 <1.3 Peak E vel 79. cm/s ------ ratio, ED 8 Ventricular septum Peak A vel 72. cm/s ------ IVS, ED 13 mm ------ 6 Aorta Deceleration 197 ms 150-23 Root diam, 31 mm ------ time 0 ED Peak 3 mm ------ Left atrium gradient, D Hg AP dim 36 mm ------ Peak E/A 1.1 ------ AP dim 1.75 cm/m^2 <2.2 ratio index Right ventricle Sa vel, lat 10. cm/s ------ ann, tiss DP 4  ------------------------------------------------------------ Prepared and Electronically Authenticated by  Cassell Clement [id number]  PHYSICAL EXAM General: Well developed, well nourished, in no acute distress. Head: Normal Neck: Negative for carotid bruits. JVD not elevated. Lungs: Clear anteriorly Heart: RRR S1 S2 without murmurs, rubs, or gallops.  Extremities: No clubbing, cyanosis or edema.  Distal pedal pulses are 2+ and equal bilaterally. Neuro: Alert and oriented X 3. Moves all extremities spontaneously. Psych:  Responds to questions appropriately with a normal affect.  ASSESSMENT AND PLAN: 1. Atypical chest pain- most consistent with musculoskeletal pain. Echo is normal. Cardiac enzymes and Ecg are normal. OK for discharge from cardiac standpoint. 2. History of CAD 3. HTN 4. HL 5. DM  Principal Problem:   CHEST  PAIN Active Problems:   HYPERCHOLESTEROLEMIA   Gout, unspecified   HYPERTENSION   Polysubstance abuse    Signed, Peter [place] MD,FACC 01/16/2013 6:59 AM

## 2013-01-16 NOTE — Discharge Summary (Signed)
Physician Discharge Summary  Russell Carter ZOX:[id number] DOB: [birth year] DOA: 01/14/2013  PCP: Provider Not In System  Admit date: 01/14/2013 Discharge date: 01/16/2013  Time spent: 65 minutes  Recommendations for Outpatient Follow-up:  1. Patient is to pick a PCP and followup one week post discharge to reassess his chest pain which is likely musculoskeletal in nature. 2. Patient is to followup with Dr. Ellyn Hack of cardiology in 1-2 weeks. 3. Social work is to give the patient information concerning polysubstance abuse.  Discharge Diagnoses:  Principal Problem:   CHEST PAIN Active Problems:   HYPERCHOLESTEROLEMIA   Gout, unspecified   HYPERTENSION   Polysubstance abuse   Discharge Condition: Stable and improved  Diet recommendation: Heart healthy  Filed Weights   01/15/13 0241  Weight: 83.3 kg (183 lb 10.3 oz)    History of present illness:  Russell Carter is a 57 y.o. male history of hypertension, hyperlipidemia, TIA and ongoing tobacco abuse and alcohol abuse presented with complaints of chest pain. Patient's chest pain started last night around 10 PM while at work. Pain was left-sided anterior chest wall nonradiating present with mild shortness of breath. The pain improved with nitroglycerin sublingual and morphine in the ER. Cardiac enzymes chest x-ray and EKG are unremarkable. 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 At this time patient has been admitted for further management. Denies any nausea vomiting abdominal pain.      Hospital Course:  #1 chest pain Patient was admitted with chest pain with both typical and atypical features. Cardiac enzymes were cycled which were negative x3. Patient was placed on aspirin as well as a nitroglycerin patch. D. dimer which was obtained was negative. 2-D echo was obtained which came back with an ejection fraction of 60-65%. NWMA. Patient was  monitored on the monitor no arrhythmias were noted. It was felt patient's chest pain was likely musculoskeletal in nature. A cardiology consultation was obtained secondary to patient's cardiac history and patient was seen in consultation by Dr. Jens Som on 01/15/2013. It was felt per cardiology that due to patient's normal ejection fraction is normal echo and negative enzymes patient was stable to be discharged home from a cardiac standpoint. Patient will be discharged on ibuprofen 800 mg 3 times daily x3 days and then to use as needed. Patient will need to followup with his cardiologist as outpatient.  The rest of patient's chronic medical issues remained stable throughout the hospitalization the patient be discharged in stable and improved condition.  Procedures:  2-D echo 01/15/2013  Chest x-ray 01/14/2013  Consultations:  Cardiology: Dr Jens Som 01/15/13  Discharge Exam: Filed Vitals:   01/15/13 1720 01/15/13 2152 01/16/13 0302 01/16/13 0839  BP: 140/81 155/77 149/79 150/85  Pulse: 66 83 96 75  Temp: 98 F (36.7 C) 98.1 F (36.7 C) 98.2 F (36.8 C)   TempSrc: Oral Oral Oral   Resp: 18 18    Height:      Weight:      SpO2: 99% 100%      General: NAD Cardiovascular: RRR Respiratory: CTAB  Discharge Instructions  Discharge Orders   Future Orders Complete By Expires     Diet - low sodium heart healthy  As directed     Discharge instructions  As directed     Comments:      Follow up with PCP in 1 week Follow up with Dr Eden Emms in 2 weeks  Increase activity slowly  As directed         Medication List    TAKE these medications       allopurinol 300 MG tablet  Commonly known as:  ZYLOPRIM  Take 1 tablet (300 mg total) by mouth daily.     aspirin EC 325 MG tablet  Take 325 mg by mouth daily.     CALCIUM-VITAMIN D PO  Take 1 tablet by mouth daily.     cyclobenzaprine 10 MG tablet  Commonly known as:  FLEXERIL  Take 10 mg by mouth 3 (three) times daily as  needed. For muscle spasms     folic acid 1 MG tablet  Commonly known as:  FOLVITE  Take 1 tablet (1 mg total) by mouth daily.     GERITOL PO  Take 2 tablets by mouth daily.     hydrochlorothiazide 25 MG tablet  Commonly known as:  HYDRODIURIL  Take 25 mg by mouth daily.     ibuprofen 800 MG tablet  Commonly known as:  ADVIL,MOTRIN  Take 1 tablet (800 mg total) by mouth every 8 (eight) hours as needed for pain. Take 1 tablet 3 times daily x 3 days, then use as needed every 6 hours for pain.     ibuprofen 200 MG tablet  Commonly known as:  ADVIL,MOTRIN  Take 800 mg by mouth every 6 (six) hours as needed. For pain     indomethacin 25 MG capsule  Commonly known as:  INDOCIN  Take 25 mg by mouth 3 (three) times daily with meals.     losartan 50 MG tablet  Commonly known as:  COZAAR  Take 1 tablet (50 mg total) by mouth daily.     meclizine 25 MG tablet  Commonly known as:  ANTIVERT  Take 25 mg by mouth 3 (three) times daily as needed. For vertigo     nitroGLYCERIN 0.4 MG SL tablet  Commonly known as:  NITROSTAT  Place 1 tablet (0.4 mg total) under the tongue every 5 (five) minutes as needed for chest pain.     pravastatin 40 MG tablet  Commonly known as:  PRAVACHOL  Take 1 tablet (40 mg total) by mouth daily.     thiamine 100 MG tablet  Take 1 tablet (100 mg total) by mouth daily.           Follow-up Information   Follow up with Provider Not In System. Schedule an appointment as soon as possible for a visit in 1 week. (Pick a PCP and follow up)       Follow up with Charlton Haws, MD. Schedule an appointment as soon as possible for a visit in 2 weeks.   Contact information:   [address] [place] [address] [phone]        The results of significant diagnostics from this hospitalization (including imaging, microbiology, ancillary and laboratory) are listed below for reference.    Significant Diagnostic Studies: Dg  Chest Portable 1 View  01/15/2013  *RADIOLOGY REPORT*  Clinical Data: Cold symptoms.  Sudden onset chest pain.  PORTABLE CHEST - 1 VIEW  Comparison: 05/13/2012.  Findings: Low volume chest.  Basilar atelectasis.  No airspace disease.  No effusion.  Cardiopericardial silhouette appears within normal limits allowing for volumes. Right greater than left AC joint osteoarthritis. Monitoring leads are projected over the chest.  IMPRESSION: Low volume chest.   Original Report Authenticated By: Andreas Newport, M.D.  Microbiology: No results found for this or any previous visit (from the past 240 hour(s)).   Labs: Basic Metabolic Panel:  Recent Labs Lab 01/14/13 2344 01/15/13 0317 01/16/13 0501  NA 140 138 136  K 4.0 3.6 3.4*  CL 103 103 103  CO2 22 25 21   GLUCOSE 94 119* 143*  BUN 12 14 12   CREATININE 1.36* 1.28  1.31 1.34  CALCIUM 9.8 8.9 8.6   Liver Function Tests:  Recent Labs Lab 01/14/13 2344 01/15/13 0317  AST 20 19  ALT 20 18  ALKPHOS 42 42  BILITOT 0.2* 0.3  PROT 7.1 6.7  ALBUMIN 3.5 3.2*   No results found for this basename: LIPASE, AMYLASE,  in the last 168 hours No results found for this basename: AMMONIA,  in the last 168 hours CBC:  Recent Labs Lab 01/14/13 2344 01/15/13 0317 01/16/13 0501  WBC 5.5 4.6 5.3  NEUTROABS 2.8  --   --   HGB 16.8 15.8 14.0  HCT 49.2 47.0 42.1  MCV 87.9 89.2 89.8  PLT 212 199 161   Cardiac Enzymes:  Recent Labs Lab 01/14/13 2344 01/15/13 0317 01/15/13 0813 01/15/13 1407  TROPONINI <0.30 <0.30 <0.30 <0.30   BNP: BNP (last 3 results) No results found for this basename: PROBNP,  in the last 8760 hours CBG: No results found for this basename: GLUCAP,  in the last 168 hours     Signed:  Allex Madia  [facility] 01/16/2013, 11:42 AM

## 2013-06-15 IMAGING — CR DG FOREARM 2V*R*
2 series · 2 of 2 positions shown · non-contrast
Comparison: 01/21/2011 and earlier.

CLINICAL DATA: 56-year-old male with extreme pain in the right
forearm, especially the wrist.  Stiffness.

RIGHT FOREARM - 2 VIEW

[x forearm ap right *]
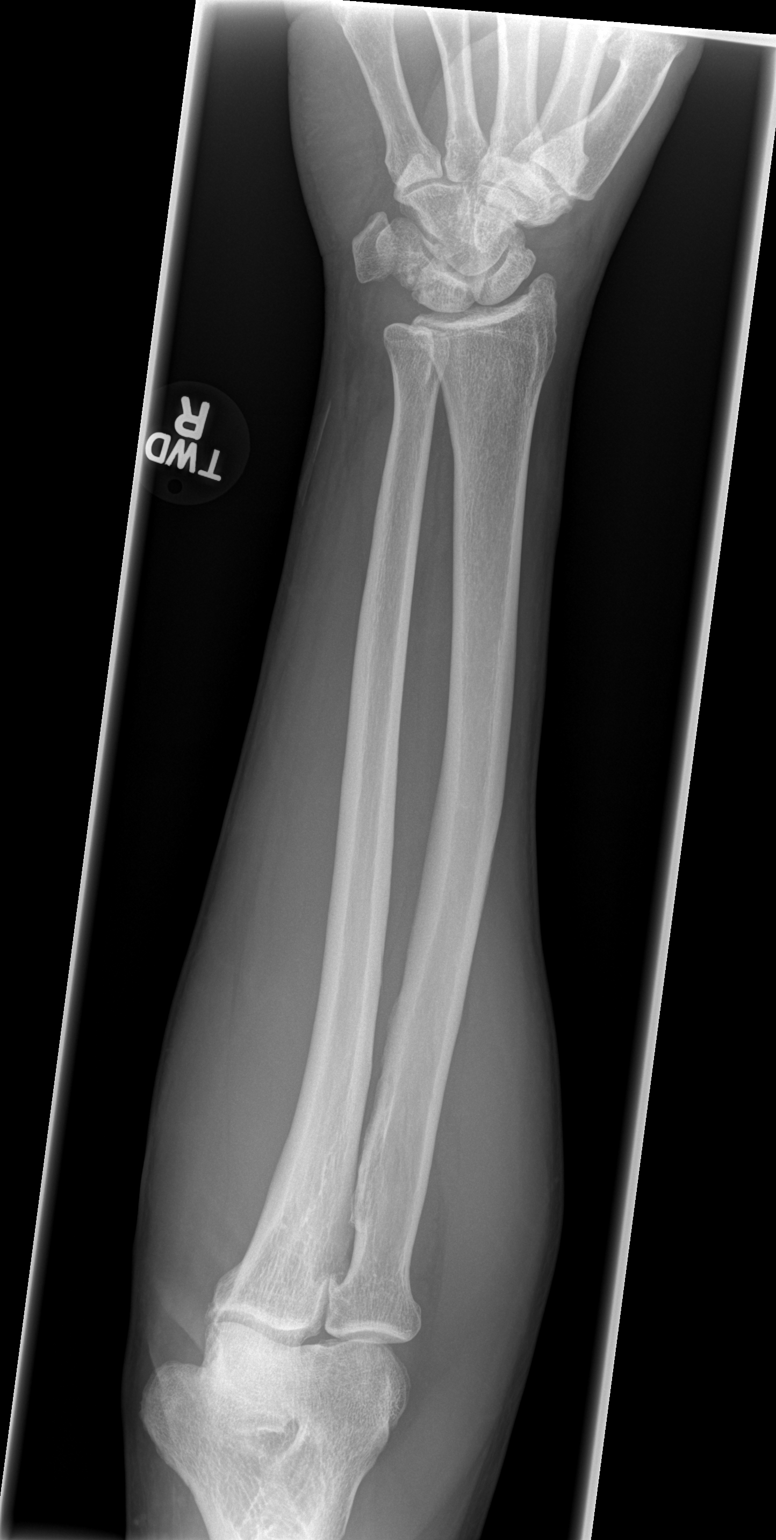

[x forearm lat right *]
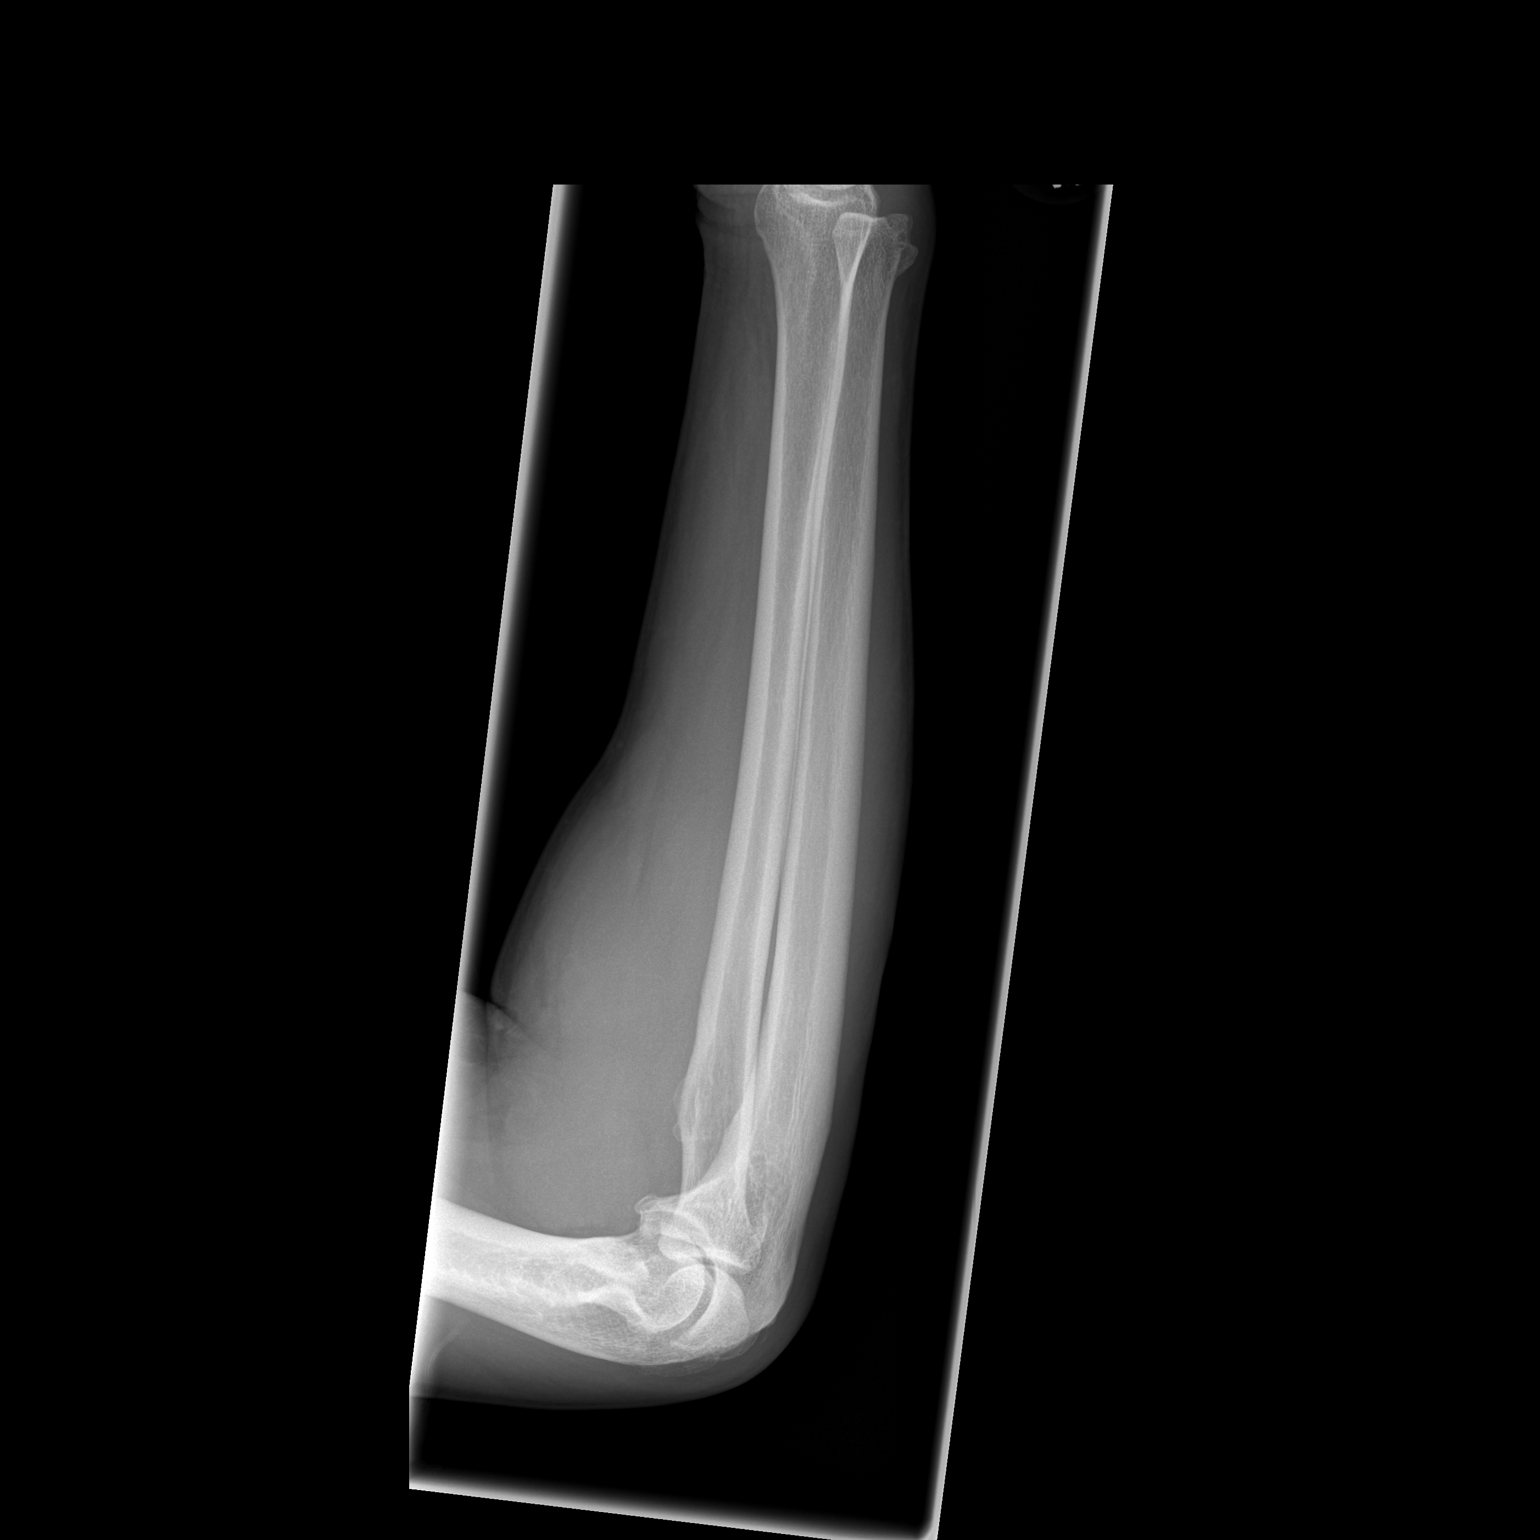

[2 of 2 positions shown; findings below may reference images not displayed]

FINDINGS: Chronic deformity of the dorsal distal ulna re-identified
and stable.  Distal radius and ulna are stable and intact.  Grossly
stable and normal carpal bone alignment.  More proximal radius and
ulna are stable and intact.  There is chronic degenerative spurring
at the olecranon and radial head.  No definite joint effusion at
the elbow.  No fracture or dislocation identified.
IMPRESSION: Stable chronic changes at the wrist and elbow.  No acute osseous
abnormality identified.

## 2013-06-15 IMAGING — CT CT HEAD W/O CM
1 of 2 series · 13 of 30 positions shown, 17 images · non-contrast
Comparison: CT 01/26/2012

CLINICAL DATA: Right-sided weakness.  Headache.

CT HEAD WITHOUT CONTRAST
TECHNIQUE: Contiguous axial images were obtained from the base of
the skull through the vertex without contrast.

[Series 2: brain · axial · 0.47mm/px · z∈[+167,+293]mm · 13 of 28 slices shown, 17 images]
[im 2/28  brain]
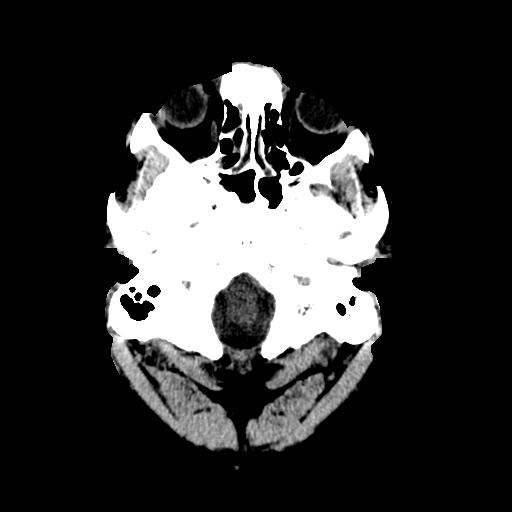
[im 2/28  bone]
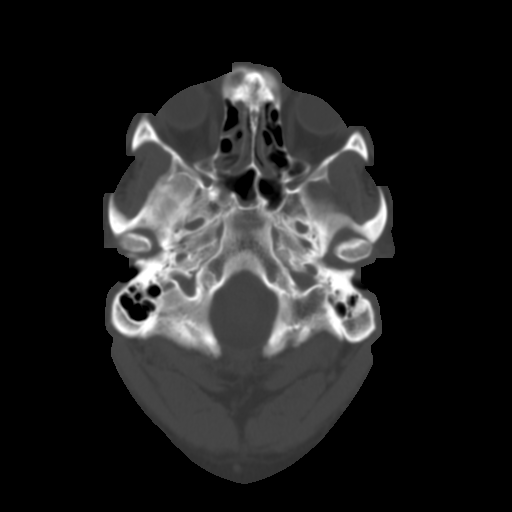
[im 4/28  brain]
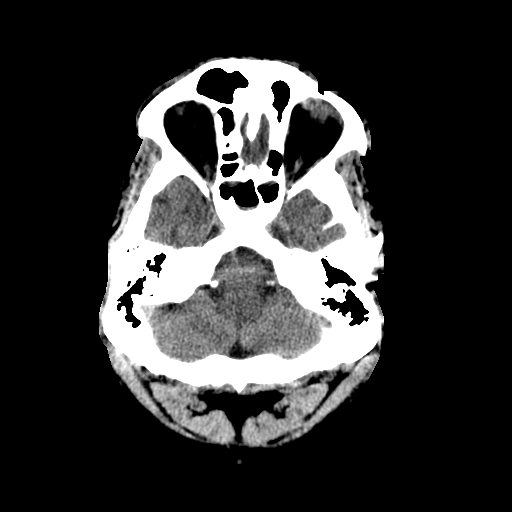
[im 6/28  brain]
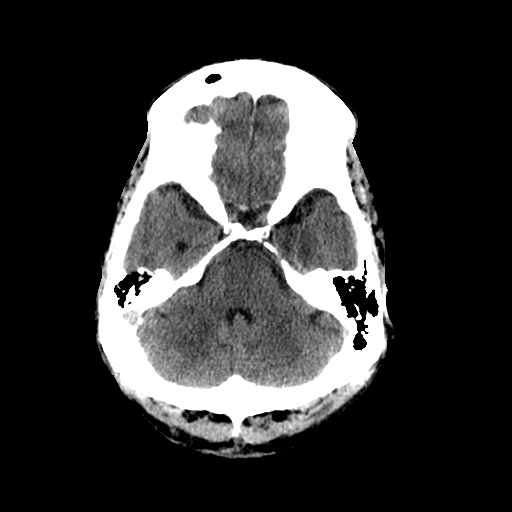
[im 8/28  brain]
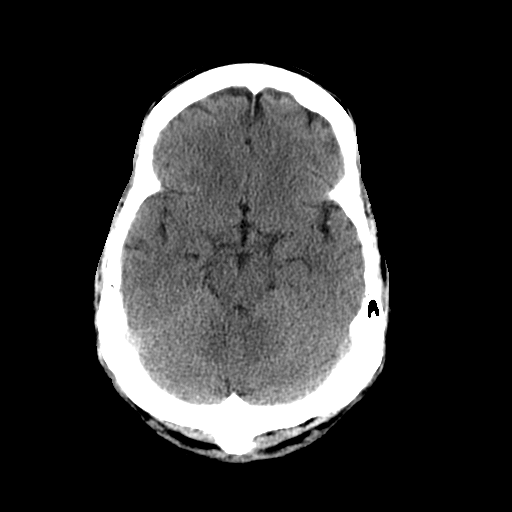
[im 10/28  brain]
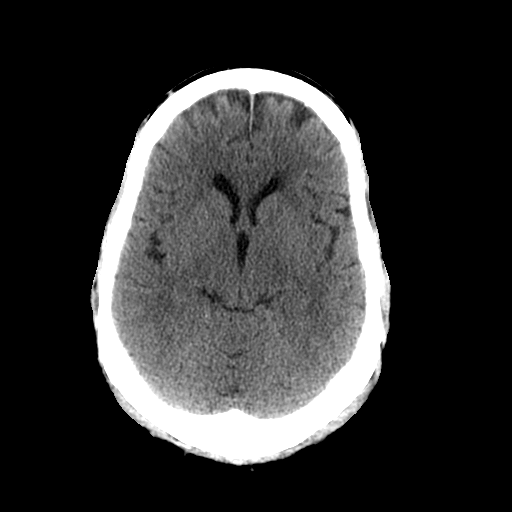
[im 10/28  bone]
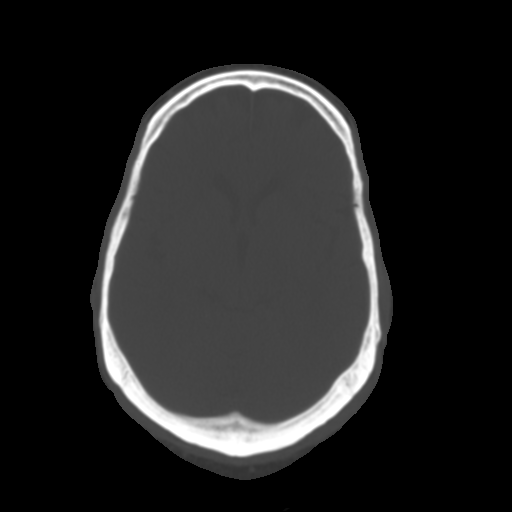
[im 12/28  brain]
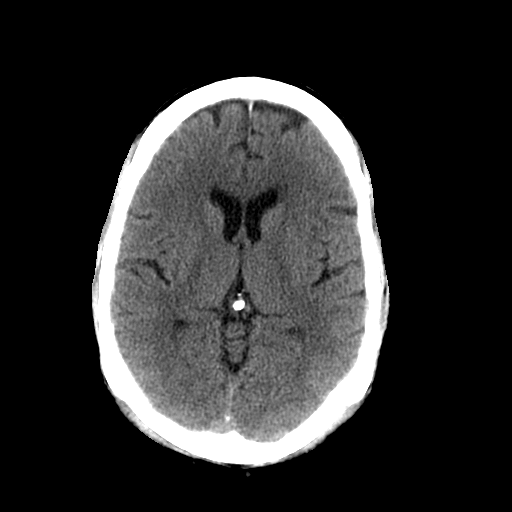
[im 14/28  brain]
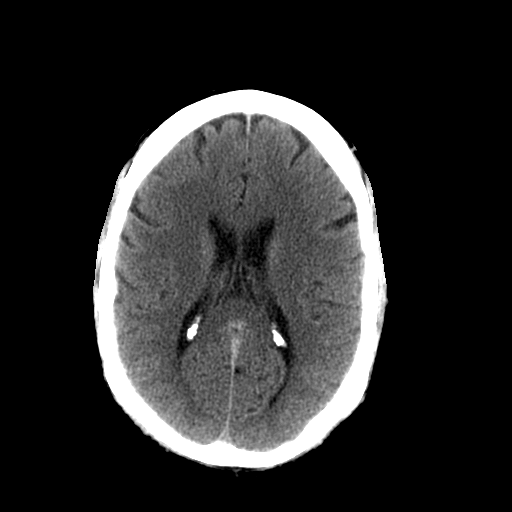
[im 16/28  brain]
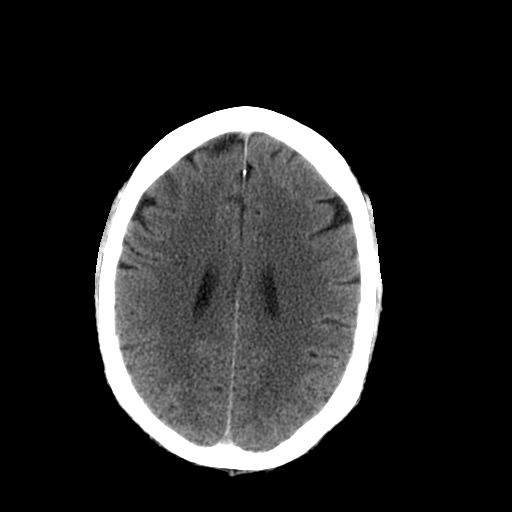
[im 18/28  brain]
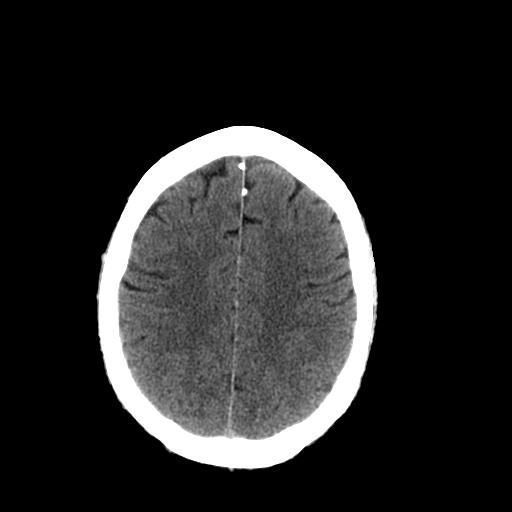
[im 18/28  bone]
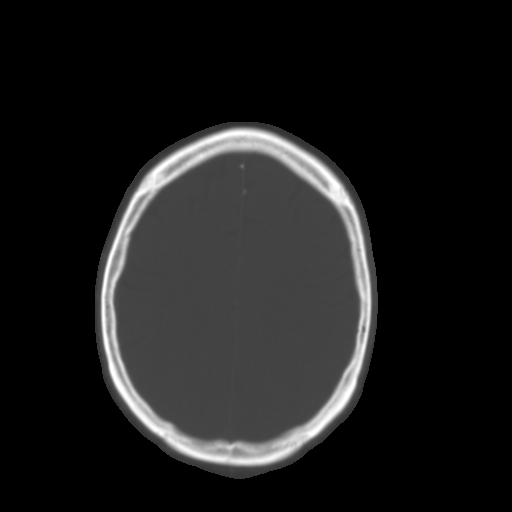
[im 20/28  brain]
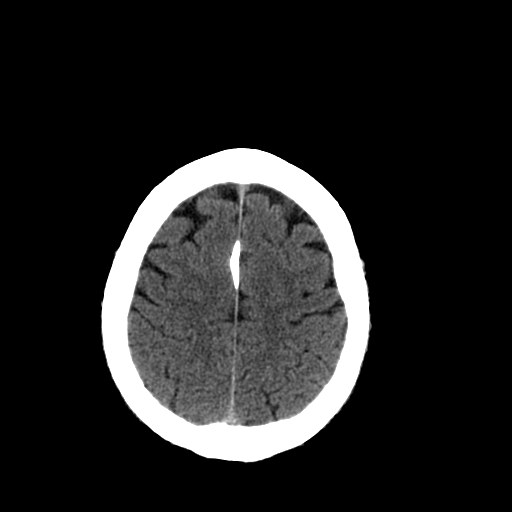
[im 22/28  brain]
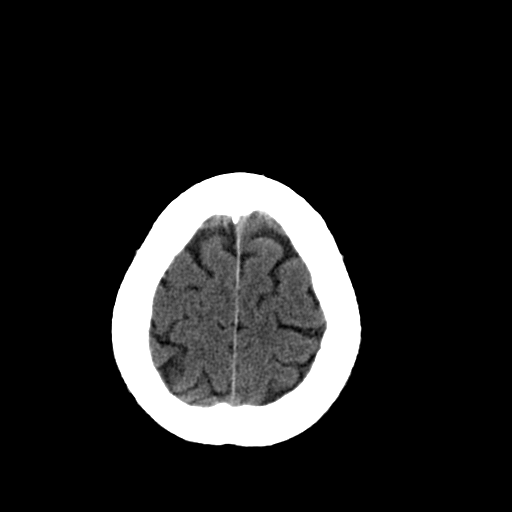
[im 24/28  brain]
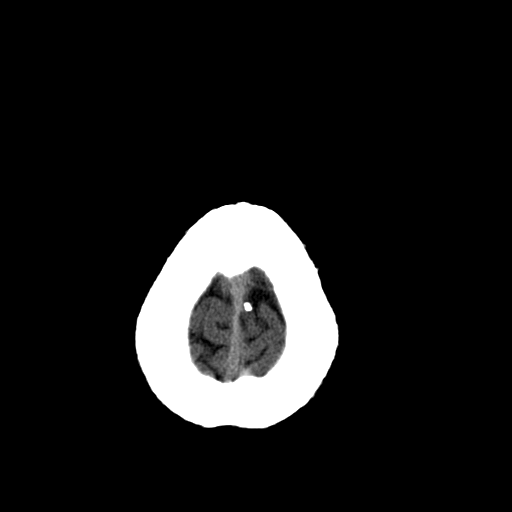
[im 26/28  brain]
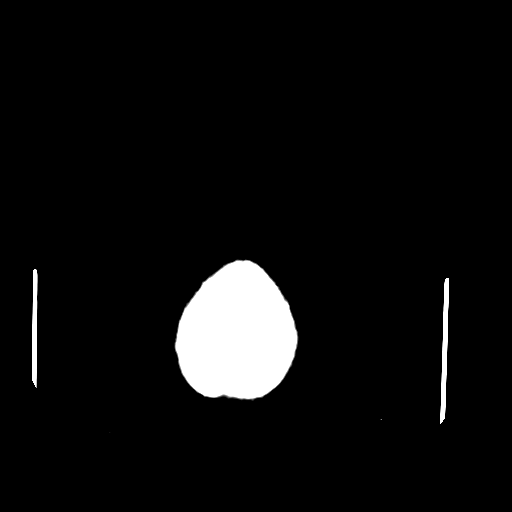
[im 26/28  bone]
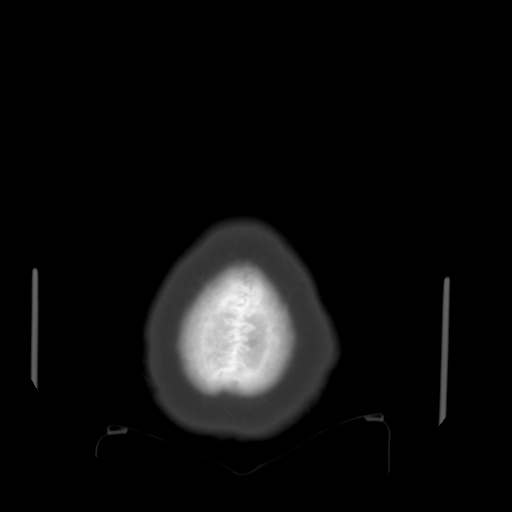

[13 of 30 positions shown; findings below may reference images not displayed]

FINDINGS: Ventricle size is normal.  Mild chronic hypodensity in
the frontal white matter on the left is unchanged.  No acute
infarct.  Negative for hemorrhage or mass.

Sinusitis with mucosal edema in the ethmoid sinuses.  Air-fluid
level in the sphenoid sinus.
IMPRESSION: No acute intracranial abnormality.

Sinusitis with air-fluid level in the sphenoid sinus.

## 2013-07-15 ENCOUNTER — Encounter: Payer: Self-pay | Admitting: Physician Assistant

## 2013-07-15 ENCOUNTER — Telehealth: Payer: Self-pay | Admitting: Cardiovascular Disease

## 2013-07-15 ENCOUNTER — Ambulatory Visit (INDEPENDENT_AMBULATORY_CARE_PROVIDER_SITE_OTHER): Payer: Self-pay | Admitting: Physician Assistant

## 2013-07-15 VITALS — BP 148/110 | HR 97 | Ht 71.5 in | Wt 200.0 lb

## 2013-07-15 DIAGNOSIS — I10 Essential (primary) hypertension: Secondary | ICD-10-CM

## 2013-07-15 DIAGNOSIS — R519 Headache, unspecified: Secondary | ICD-10-CM

## 2013-07-15 DIAGNOSIS — R0602 Shortness of breath: Secondary | ICD-10-CM

## 2013-07-15 DIAGNOSIS — R079 Chest pain, unspecified: Secondary | ICD-10-CM

## 2013-07-15 DIAGNOSIS — R51 Headache: Secondary | ICD-10-CM

## 2013-07-15 MED ORDER — LOSARTAN POTASSIUM 100 MG PO TABS: 100.0000 mg | ORAL_TABLET | Freq: Every day | ORAL | Status: DC

## 2013-07-15 MED ORDER — CLONIDINE HCL 0.1 MG PO TABS
0.1000 mg | ORAL_TABLET | ORAL | Status: AC
  Administered 2013-07-15: 0.1 mg via ORAL

## 2013-07-15 MED ORDER — PRAVASTATIN SODIUM 40 MG PO TABS: 40.0000 mg | ORAL_TABLET | Freq: Every day | ORAL | Status: DC

## 2013-07-15 NOTE — Progress Notes (Signed)
HPI:  This is a 57 year old African American male patient of Dr.Nishan who has a history of non-ST elevation MI secondary to cocaine abuse. Cardiac cath in 2011 showed 50% LAD lesion. He also has history of hypertension, hyperlipidemia, and TIA. The patient comes in today complaining of sharp shooting pains in the back of his head they go down his left arm and left leg. It's been constant since Sunday. He also had an episode of chest pain described as crushing pain in his chest on Sunday at work. He took 3 nitroglycerin with relief and rested for about an hour and a half. He took the last of his blood pressure medications this morning this the pressure is quite elevated in the office now. He says he has not missed any doses and denies cocaine or any drug abuse. 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   Allergies -- Shellfish Allergy -- Anaphylaxis  Current Outpatient Prescriptions on File Prior to Visit: allopurinol (ZYLOPRIM) 300 MG tablet, Take 1 tablet (300 mg total) by mouth daily., Disp: 30 tablet, Rfl: 6 aspirin EC 325 MG tablet, Take 325 mg by mouth daily.  , Disp: , Rfl:  CALCIUM-VITAMIN D PO, Take 1 tablet by mouth daily., Disp: , Rfl:  cyclobenzaprine (FLEXERIL) 10 MG tablet, Take 10 mg by mouth 3 (three) times daily as needed. For muscle spasms, Disp: , Rfl:  folic acid (FOLVITE) 1 MG tablet, Take 1 tablet (1 mg total) by mouth daily., Disp: , Rfl:  hydrochlorothiazide (HYDRODIURIL) 25 MG tablet, Take 25 mg by mouth daily. , Disp: , Rfl:  ibuprofen (ADVIL,MOTRIN) 200 MG tablet, Take 800 mg by mouth every 6 (six) hours as needed. For pain, Disp: , Rfl:  ibuprofen (ADVIL,MOTRIN) 800 MG tablet, Take 1 tablet (800 mg total) by mouth every 8 (eight) hours as needed for pain. Take 1 tablet 3 times daily x 3 days, then use as needed every 6 hours for pain., Disp: 15 tablet, Rfl: 0 indomethacin (INDOCIN) 25 MG capsule, Take 25 mg by mouth 3  (three) times daily with meals. , Disp: , Rfl:  Iron-Vitamins (GERITOL PO), Take 2 tablets by mouth daily., Disp: , Rfl:  meclizine (ANTIVERT) 25 MG tablet, Take 25 mg by mouth 3 (three) times daily as needed. For vertigo, Disp: , Rfl:  nitroGLYCERIN (NITROSTAT) 0.4 MG SL tablet, Place 1 tablet (0.4 mg total) under the tongue every 5 (five) minutes as needed for chest pain., Disp: 20 tablet, Rfl: 0 pravastatin (PRAVACHOL) 40 MG tablet, Take 1 tablet (40 mg total) by mouth daily., Disp: 30 tablet, Rfl: 2 thiamine 100 MG tablet, Take 1 tablet (100 mg total) by mouth daily., Disp: , Rfl:  losartan (COZAAR) 50 MG tablet, Take 1 tablet (50 mg total) by mouth daily., Disp: 30 tablet, Rfl: 2 [DISCONTINUED] famotidine (PEPCID) 20 MG tablet, Take 1 tablet (20 mg total) by mouth 2 (two) times daily., Disp: 30 tablet, Rfl: 0 [DISCONTINUED] TACRINE HYDROCHLORIDE PO, Take 25 mg by mouth daily.  , Disp: , Rfl:   No current facility-administered medications on file prior to visit.   Past Medical History:   Hyperglycemia                                                Gout, unspecified  Hypercholesterolemia                                         HTN (hypertension)                                           Blood in stool                                               Migraines                                                    Stroke                                                       Arthritis                                                    Pneumonia                                                    Depression                                                   Myocardial infarction                                          Comment:drug cocaine and marijuana at time of mi none                since     Cocaine abuse                                                  Comment:history  Past Surgical History:   reapir of left inguinal hernia                    02/02/2003      Comment:Dr. Violeta Gelinas. repair with mesh. Same               procedure done again in  05/22/2004    ROTATOR CUFF REPAIR  2010           Comment:left    CARDIAC CATHETERIZATION                          2008           Comment:[facility]-40%lad   RECONSTRUCT / STABILIZE DISTAL ULNA                           INGUINAL HERNIA REPAIR                           05/12/2012      Comment:Procedure: HERNIA REPAIR INGUINAL ADULT;                Surgeon: Liz Malady, MD;  Location: [name];  Service: General;                Laterality: Right;   HERNIA REPAIR                                                Review of patient's family history indicates:   Heart attack                   Mother                     Comment: x7. Still alive   Heart attack                   Father                     Comment: deceased b/c of MI   Heart attack                   Brother                    Comment: older, deceased b/c of MI    Social History   Marital Status: Married             Spouse Name:                      Years of Education:                 Number of children:             Occupational History   None on file  Social History Main Topics   Smoking Status: Never Smoker                     Smokeless Status: Never Used                       Alcohol Use: Yes           4.2 oz/week      7 Cans of beer per week      Comment: occasional beer   Drug Use: Yes               Special: "Crack" cocaine, Marijuana      Comment: occasional marijuana, cocaine use   Sexual Activity:  Not on file        Other Topics            Concern   None on file  Social History Narrative   Lives in [place] with his wife and son.     ROS: See history of present illness otherwise negative   PHYSICAL EXAM: Well-nournished, in no acute distress. Neck: No JVD, HJR, Bruit, or thyroid enlargement  Lungs: No tachypnea, clear without wheezing,  rales, or rhonchi  Cardiovascular: RRR, PMI not displaced, heart sounds normal, no murmurs, gallops, bruit, thrill, or heave.  Abdomen: BS normal. Soft without organomegaly, masses, lesions or tenderness.  Extremities: without cyanosis, clubbing or edema. Good distal pulses bilateral  SKin: Warm, no lesions or rashes   Musculoskeletal: No deformities  Neuro: no focal signs  BP 167/105  Pulse 97  Ht 5' 11.5" (1.816 m)  Wt 200 lb (90.719 kg)  BMI 27.51 kg/m2   EKG: Normal sinus rhythm at 97 beats per minute with poor R wave progression, no acute change

## 2013-07-15 NOTE — Patient Instructions (Addendum)
INCREASE COZAAR TO 100 MG DAILY  A REFILL FOR PRAVACHOL HAS BEEN SENT TO YOUR PHARMACY  TAKE BENICAR HCT 40/12.5MG  TWICE DAILY FOR 3 DAYS. UNTIL YOU ARE ABLE TO PICK UP YOUR COZAAR  Your physician has requested that you have en exercise stress myoview. For further information please visit [email or web address] Please follow instruction sheet, as given.  NURSE VISIT BP CHECK IN 1 WEEK  Your physician recommends that you schedule a follow-up appointment in: 1 MONTH  WITH DR.NISHAN 2 Gram Low Sodium Diet A 2 gram sodium diet restricts the amount of sodium in the diet to no more than 2 g or 2000 mg daily. Limiting the amount of sodium is often used to help lower blood pressure. It is important if you have heart, liver, or kidney problems. Many foods contain sodium for flavor and sometimes as a preservative. When the amount of sodium in a diet needs to be low, it is important to know what to look for when choosing foods and drinks. The following includes some information and guidelines to help make it easier for you to adapt to a low sodium diet. QUICK TIPS  Do not add salt to food.  Avoid convenience items and fast food.  Choose unsalted snack foods.  Buy lower sodium products, often labeled as "lower sodium" or "no salt added."  Check food labels to learn how much sodium is in 1 serving.  When eating at a restaurant, ask that your food be prepared with less salt or none, if possible. READING FOOD LABELS FOR SODIUM INFORMATION The nutrition facts label is a good place to find how much sodium is in foods. Look for products with no more than 500 to 600 mg of sodium per meal and no more than 150 mg per serving. Remember that 2 g = 2000 mg. The food label may also list foods as:  Sodium-free: Less than 5 mg in a serving.  Very low sodium: 35 mg or less in a serving.  Low-sodium: 140 mg or less in a serving.  Light in sodium: 50% less sodium in a serving. For example, if a food that  usually has 300 mg of sodium is changed to become light in sodium, it will have 150 mg of sodium.  Reduced sodium: 25% less sodium in a serving. For example, if a food that usually has 400 mg of sodium is changed to reduced sodium, it will have 300 mg of sodium. CHOOSING FOODS Grains  Avoid: Salted crackers and snack items. Some cereals, including instant hot cereals. Bread stuffing and biscuit mixes. Seasoned rice or pasta mixes.  Choose: Unsalted snack items. Low-sodium cereals, oats, puffed wheat and rice, shredded wheat. English muffins and bread. Pasta. Meats  Avoid: Salted, canned, smoked, spiced, pickled meats, including fish and poultry. Bacon, ham, sausage, cold cuts, hot dogs, anchovies.  Choose: Low-sodium canned tuna and salmon. Fresh or frozen meat, poultry, and fish. Dairy  Avoid: Processed cheese and spreads. Cottage cheese. Buttermilk and condensed milk. Regular cheese.  Choose: Milk. Low-sodium cottage cheese. Yogurt. Sour cream. Low-sodium cheese. Fruits and Vegetables  Avoid: Regular canned vegetables. Regular canned tomato sauce and paste. Frozen vegetables in sauces. Olives. Rosita Fire. Relishes. Sauerkraut.  Choose: Low-sodium canned vegetables. Low-sodium tomato sauce and paste. Frozen or fresh vegetables. Fresh and frozen fruit. Condiments  Avoid: Canned and packaged gravies. Worcestershire sauce. Tartar sauce. Barbecue sauce. Soy sauce. Steak sauce. Ketchup. Onion, garlic, and table salt. Meat flavorings and tenderizers.  Choose: Fresh and dried herbs and  spices. Low-sodium varieties of mustard and ketchup. Lemon juice. Tabasco sauce. Horseradish. SAMPLE 2 GRAM SODIUM MEAL PLAN Breakfast / Sodium (mg)  1 cup low-fat milk / 143 mg  2 slices whole-wheat toast / 270 mg  1 tbs heart-healthy margarine / 153 mg  1 hard-boiled egg / 139 mg  1 small orange / 0 mg Lunch / Sodium (mg)  1 cup raw carrots / 76 mg   cup hummus / 298 mg  1 cup low-fat milk /  143 mg   cup red grapes / 2 mg  1 whole-wheat pita bread / 356 mg Dinner / Sodium (mg)  1 cup whole-wheat pasta / 2 mg  1 cup low-sodium tomato sauce / 73 mg  3 oz lean ground beef / 57 mg  1 small side salad (1 cup raw spinach leaves,  cup cucumber,  cup yellow bell pepper) with 1 tsp olive oil and 1 tsp red wine vinegar / 25 mg Snack / Sodium (mg)  1 container low-fat vanilla yogurt / 107 mg  3 graham cracker squares / 127 mg Nutrient Analysis  Calories: 2033  Protein: 77 g  Carbohydrate: 282 g  Fat: 72 g  Sodium: 1971 mg Document Released: 10/08/2005 Document Revised: 12/31/2011 Document Reviewed: 01/09/2010 [employer] Patient Information 2014 [place], [place].

## 2013-07-15 NOTE — Telephone Encounter (Signed)
Spoke with patient who states he is having left arm, neck pain, and left arm numbness x 1 1/2 weeks. Patient denies chest pain but states he has taken NTG to relieve the arm pain. Patient states he believes the NTG has helped some.  Patient denies SOB, n/v, diaphoresis.  Patient speaks clearly, denies weakness of left lower extremity but states he does have weakness in left upper extremity.  Patient states he also has some pain behind his left ear.  I asked patient if anyone could drive him to our office and he advised that he will be taking public transportation.  I advised him to come here and changed his appointment from 9/30 with Tereso Newcomer, PA-C to today with Jacolyn Reedy, PA-C at 11:45.  Patient states he is en route.

## 2013-07-15 NOTE — Assessment & Plan Note (Addendum)
Patient's blood pressure is elevated in the office today 167/105 and on recheck 148/110. Is not feeling well. We will give him clonidine 0.1 mg in the office. The patient's blood pressure came down some and he says his headache and dizziness and left-sided arm numbness has resolved. Will increase his Cozaar to 100 mg daily. Patient says he can fill his Cozaar until Friday so we have given him samples of Benicar HCTZ 40/12.5 mg to take twice a day until he gets his Cozaar filled. 2 g sodium diet. Recheck his blood pressure early next week. Follow up with Dr.Nishan next week.

## 2013-07-15 NOTE — Assessment & Plan Note (Addendum)
Patient is complaining of a headache described as sharp shooting pains in the back of his head with pain and tingling down his left arm and left leg. I suspect is due to his elevated blood pressure. I discussed the CT scan with the patient that he would rather get his blood pressure under control before he proceeds with a CT of his head. We're giving him clonidine in the office in increase in his medications.  After giving the patient clonidine in the office his headache and left-sided tingling down his arm and leg have resolved.

## 2013-07-15 NOTE — Telephone Encounter (Signed)
New Problem  Pt is experiencing harsh pain in the left side of the neck w/ numbness of the left side of his face and left arm. Pt also states that behind the left ear there is severe pain in the lower part of his head. This pain has been persistent for 1 1/2 weeks. Made appt for 9:30// pt asks for a call back to determine if is sooner appt is needed or ER.

## 2013-07-15 NOTE — Assessment & Plan Note (Signed)
Patient had one episode of chest pain while at work on Sunday. His blood pressure is very elevated today. He has a history of MI due to cocaine abuse in the past and has a 50% LAD on cath in 2011. We'll check a stress Myoview to rule out ischemia once we get his blood pressure under control.

## 2013-07-15 NOTE — Telephone Encounter (Signed)
I attempted to call and talk with patient about his symptoms and when I reached him we were cut off.  I have attempted to reach the patient on cell and home numbers and cannot get through to him.

## 2013-07-21 ENCOUNTER — Ambulatory Visit: Payer: Self-pay | Admitting: Physician Assistant

## 2013-07-29 ENCOUNTER — Ambulatory Visit (HOSPITAL_COMMUNITY): Payer: Self-pay | Attending: Cardiology | Admitting: Radiology

## 2013-07-29 VITALS — BP 163/106 | HR 73 | Ht 71.5 in | Wt 198.0 lb

## 2013-07-29 DIAGNOSIS — R55 Syncope and collapse: Secondary | ICD-10-CM | POA: Insufficient documentation

## 2013-07-29 DIAGNOSIS — R0602 Shortness of breath: Secondary | ICD-10-CM | POA: Insufficient documentation

## 2013-07-29 DIAGNOSIS — R42 Dizziness and giddiness: Secondary | ICD-10-CM | POA: Insufficient documentation

## 2013-07-29 DIAGNOSIS — R5381 Other malaise: Secondary | ICD-10-CM | POA: Insufficient documentation

## 2013-07-29 DIAGNOSIS — R079 Chest pain, unspecified: Secondary | ICD-10-CM | POA: Insufficient documentation

## 2013-07-29 MED ORDER — TECHNETIUM TC 99M SESTAMIBI GENERIC - CARDIOLITE
10.0000 | Freq: Once | INTRAVENOUS | Status: AC | PRN
  Administered 2013-07-29: 10 via INTRAVENOUS

## 2013-07-29 MED ORDER — TECHNETIUM TC 99M SESTAMIBI GENERIC - CARDIOLITE
30.0000 | Freq: Once | INTRAVENOUS | Status: AC | PRN
  Administered 2013-07-29: 30 via INTRAVENOUS

## 2013-07-29 NOTE — Progress Notes (Signed)
[facility] SITE 3 NUCLEAR MED [address] [place], [place] [address] [phone]    Cardiology Nuclear Med Russell Carter is a 57 y.o. male     MRN : [id number]     DOB: 04/06/1956  Procedure Date: 07/29/2013  Nuclear Med Background Indication for Stress Test:  Evaluation for Ischemia History:  Asthma, '08 NSTEMI secondary to cocaine abuse; 10-29-07 Myocardial Perfusion Study-Mild apical thinning, no ischemia, EF=58%; '11 Heart Catheterization-50% LAD,and CFX, EF=normal; and 02-2010 Echo: EF=60-65% Cardiac Risk Factors: Strong Family History - CAD, Hypertension, Lipids and TIA  Symptoms:  Chest Pain (last date of chest discomfort was last week), Dizziness, Fatigue, Light-Headedness, Near Syncope, Rapid HR and SOB   Nuclear Pre-Procedure Caffeine/Decaff Intake:  None > 12 hrs NPO After: 10:30pm   Lungs:  clear O2 Sat: 98% on room air. IV 0.9% NS with Angio Cath:  20g  IV Site: R Antecubital  IV Started by:  Dario Guardian, CNMT  Chest Size (in):  42 Cup Size: n/a  Height: 5' 11.5" (1.816 m)  Weight:  198 lb (89.812 kg)  BMI:  Body mass index is 27.23 kg/(m^2). Tech Comments:  n/a    Nuclear Med Study 1 or 2 day study: 1 day  Stress Test Type:  Stress  Reading MD: Kristeen Miss, MD  Order Authorizing Provider:  Charlton Haws, MD, and Jacolyn Reedy, [facility]  Resting Radionuclide: Technetium 12m Sestamibi  Resting Radionuclide Dose: 11.0 mCi   Stress Radionuclide:  Technetium 8m Sestamibi  Stress Radionuclide Dose: 33.0 mCi           Stress Protocol Rest HR: 73 Stress HR: 142  Rest BP: 163/106 Stress BP: 227/104  Exercise Time (min): 7:15 METS: 8.9   Predicted Max HR: 162 bpm % Max HR: 87.65 bpm Rate Pressure Product: 21308   Dose of Adenosine (mg):  n/a Dose of Lexiscan: n/a mg  Dose of Atropine (mg): n/a Dose of Dobutamine: n/a mcg/kg/min (at max HR)  Stress Test Technologist: Nelson Chimes, BS-ES  Nuclear Technologist:  Domenic Polite, CNMT     Rest  Procedure:  Myocardial perfusion imaging was performed at rest 45 minutes following the intravenous administration of Technetium 38m Sestamibi. Rest ECG: NSR, there is poor R wave progression c/w previous anterior MI.  Stress Procedure:  The patient exercised on the treadmill utilizing the Bruce Protocol for 7:15 minutes. The patient stopped due to SOB and denied any chest pain.  His SOB resolved in recovery. Technetium 49m Sestamibi was injected at peak exercise and myocardial perfusion imaging was performed after a brief delay. Stress ECG: No significant change from baseline ECG  QPS Raw Data Images:  There is no interference from nuclear activity from structures below the diaphragm.   Stress Images:  There is a small area of mild attenuation of the mid lateral wall.  The uptake in the remaining walls is normal.   Rest Images:  There is a small area of mild attenuation of the mid lateral wall.  The uptake in the remaining walls is normal.  Subtraction (SDS):  No evidence of ischemia. Transient Ischemic Dilatation (Normal <1.22):  N/A Lung/Heart Ratio (Normal <0.45):  0.35  Quantitative Gated Spect Images QGS EDV:  71 ml QGS ESV:  25 ml  Impression Exercise Capacity:  Fair exercise capacity. BP Response:  Hypertensive blood pressure response. Clinical Symptoms:  No chest pain. ECG Impression:  No significant ST segment change suggestive of ischemia. Comparison with Prior Nuclear Study: 2009  Overall Impression:  Low risk stress nuclear study .  There is mild attenuation of the mid lateral wall that is likely due to chest wall attenuation.  There is no ischemia. .  LV Ejection Fraction: 64%.  LV Wall Motion:  NL LV Function; NL Wall Motion.   Vesta Mixer, Montez Hageman., MD, [facility] 07/30/2013, 5:11 PM Office - [phone] Pager [phone]

## 2013-08-03 NOTE — Telephone Encounter (Signed)
PT AWARE OF MYOVIEW RESULTS./CY 

## 2013-08-03 NOTE — Telephone Encounter (Signed)
Follow up ° ° °Pt returning your call °

## 2013-08-20 ENCOUNTER — Encounter: Payer: Self-pay | Admitting: Cardiovascular Disease

## 2013-08-20 ENCOUNTER — Ambulatory Visit (INDEPENDENT_AMBULATORY_CARE_PROVIDER_SITE_OTHER): Payer: Self-pay | Admitting: Cardiovascular Disease

## 2013-08-20 VITALS — BP 176/104 | HR 91 | Ht 71.5 in | Wt 207.1 lb

## 2013-08-20 DIAGNOSIS — R079 Chest pain, unspecified: Secondary | ICD-10-CM

## 2013-08-20 DIAGNOSIS — I10 Essential (primary) hypertension: Secondary | ICD-10-CM

## 2013-08-20 DIAGNOSIS — E785 Hyperlipidemia, unspecified: Secondary | ICD-10-CM

## 2013-08-20 NOTE — Assessment & Plan Note (Signed)
Stable with no angina and good activity level.  Continue medical Rx Low risk myovue  07/29/13 with no ischemia

## 2013-08-20 NOTE — Progress Notes (Signed)
Patient ID: Russell Carter, male   DOB: [birth year], 57 y.o.   MRN: [id number] This is a 57 year old African American male patient of Dr.Jong Rickman who has a history of non-ST elevation MI secondary to cocaine abuse. Cardiac cath in 2011 showed 50% LAD lesion. He also has history of hypertension, hyperlipidemia, and TIA. The patient comes in today complaining of sharp shooting pains in the back of his head they go down his left arm and left leg. It's been constant since Sunday. He also had an episode of chest pain described as crushing pain in his chest on Sunday at work. He took 3 nitroglycerin with relief and rested for about an hour and a half.  F/U myovue 9/24  Overall Impression: Low risk stress nuclear study . There is mild attenuation of the mid lateral wall that is likely due to chest wall attenuation. There is no ischemia. .  LV Ejection Fraction: 64%. LV Wall Motion: NL LV Function; NL Wall Motion.  Since last visit has lost his job at [employer] and has not taken any BP meds due to finances for 2 weeks No further chest pain Has some neck and arm pain that Sounds more related to cervical or shoulder issues   ROS: Denies fever, malais, weight loss, blurry vision, decreased visual acuity, cough, sputum, SOB, hemoptysis, pleuritic pain, palpitaitons, heartburn, abdominal pain, melena, lower extremity edema, claudication, or rash.  All other systems reviewed and negative  General: Affect appropriate Healthy:  appears stated age HEENT: normal Neck supple with no adenopathy JVP normal no bruits no thyromegaly Lungs clear with no wheezing and good diaphragmatic motion Heart:  S1/S2 no murmur, no rub, gallop or click PMI normal Abdomen: benighn, BS positve, no tenderness, no AAA no bruit.  No HSM or HJR Distal pulses intact with no bruits No edema Neuro non-focal Skin warm and dry No muscular weakness   Current Outpatient Prescriptions  Medication Sig Dispense Refill  . allopurinol  (ZYLOPRIM) 300 MG tablet Take 1 tablet (300 mg total) by mouth daily.  30 tablet  6  . aspirin EC 325 MG tablet Take 325 mg by mouth daily.        Marland Kitchen CALCIUM-VITAMIN D PO Take 1 tablet by mouth daily.      . cyclobenzaprine (FLEXERIL) 10 MG tablet Take 10 mg by mouth 3 (three) times daily as needed. For muscle spasms      . folic acid (FOLVITE) 1 MG tablet Take 1 tablet (1 mg total) by mouth daily.      Marland Kitchen ibuprofen (ADVIL,MOTRIN) 200 MG tablet Take 800 mg by mouth every 6 (six) hours as needed. For pain      . ibuprofen (ADVIL,MOTRIN) 800 MG tablet Take 1 tablet (800 mg total) by mouth every 8 (eight) hours as needed for pain. Take 1 tablet 3 times daily x 3 days, then use as needed every 6 hours for pain.  15 tablet  0  . indomethacin (INDOCIN) 25 MG capsule Take 25 mg by mouth 3 (three) times daily with meals.       . Iron-Vitamins (GERITOL PO) Take 2 tablets by mouth daily.      Marland Kitchen losartan (COZAAR) 100 MG tablet Take 1 tablet (100 mg total) by mouth daily.  30 tablet  6  . meclizine (ANTIVERT) 25 MG tablet Take 25 mg by mouth 3 (three) times daily as needed. For vertigo      . nitroGLYCERIN (NITROSTAT) 0.4 MG SL tablet Place 1 tablet (0.4  mg total) under the tongue every 5 (five) minutes as needed for chest pain.  20 tablet  0  . pravastatin (PRAVACHOL) 40 MG tablet Take 1 tablet (40 mg total) by mouth daily.  30 tablet  6  . thiamine 100 MG tablet Take 1 tablet (100 mg total) by mouth daily.      . hydrochlorothiazide (HYDRODIURIL) 25 MG tablet Take 25 mg by mouth daily.       . [DISCONTINUED] famotidine (PEPCID) 20 MG tablet Take 1 tablet (20 mg total) by mouth 2 (two) times daily.  30 tablet  0  . [DISCONTINUED] TACRINE HYDROCHLORIDE PO Take 25 mg by mouth daily.         No current facility-administered medications for this visit.    Allergies  Shellfish allergy  Electrocardiogram:  9/24/  SR rate 97 nonspecific ST/T wave changes   Assessment and Plan

## 2013-08-20 NOTE — Assessment & Plan Note (Signed)
Cholesterol is at goal.  Continue current dose of statin and diet Rx.  No myalgias or side effects.  F/U  LFT's in 6 months. Lab Results  Component Value Date   LDLCALC  Value: 157        Total Cholesterol/HDL:CHD Risk Coronary Heart Disease Risk Table                     Men   Women  1/2 Average Risk   3.4   3.3  Average Risk       5.0   4.4  2 X Average Risk   9.6   7.1  3 X Average Risk  23.4   11.0        Use the calculated Patient Ratio above and the CHD Risk Table to determine the patient's CHD Risk.        ATP III CLASSIFICATION (LDL):  <100     mg/dL   Optimal  161-096  mg/dL   Near or Above                    Optimal  130-159  mg/dL   Borderline  045-409  mg/dL   High  >811     mg/dL   Very High* 06/22/4781

## 2013-08-20 NOTE — Patient Instructions (Addendum)
Your physician wants you to follow-up in: 6 MONTHS WITH DR Haywood Filler will receive a reminder letter in the mail two months in advance. If you don't receive a letter, please call our office to schedule the follow-up appointment. TAKE  BENICAR HCT  20/12.5  IN AM UNTIL  CAN GET MED FILLED Your physician recommends that you continue on your current medications as directed. Please refer to the Current Medication list given to you today.

## 2013-08-20 NOTE — Assessment & Plan Note (Signed)
Samples of Benicar/HCTZ given he will try to refill his meds in 2 weeks Trying to get unemployment benefits

## 2013-10-18 IMAGING — CR DG CHEST 1V PORT
1 series · 1 of 1 positions shown · non-contrast
Comparison: 05/13/2012.

CLINICAL DATA: Cold symptoms.  Sudden onset chest pain.

PORTABLE CHEST - 1 VIEW

[AP]
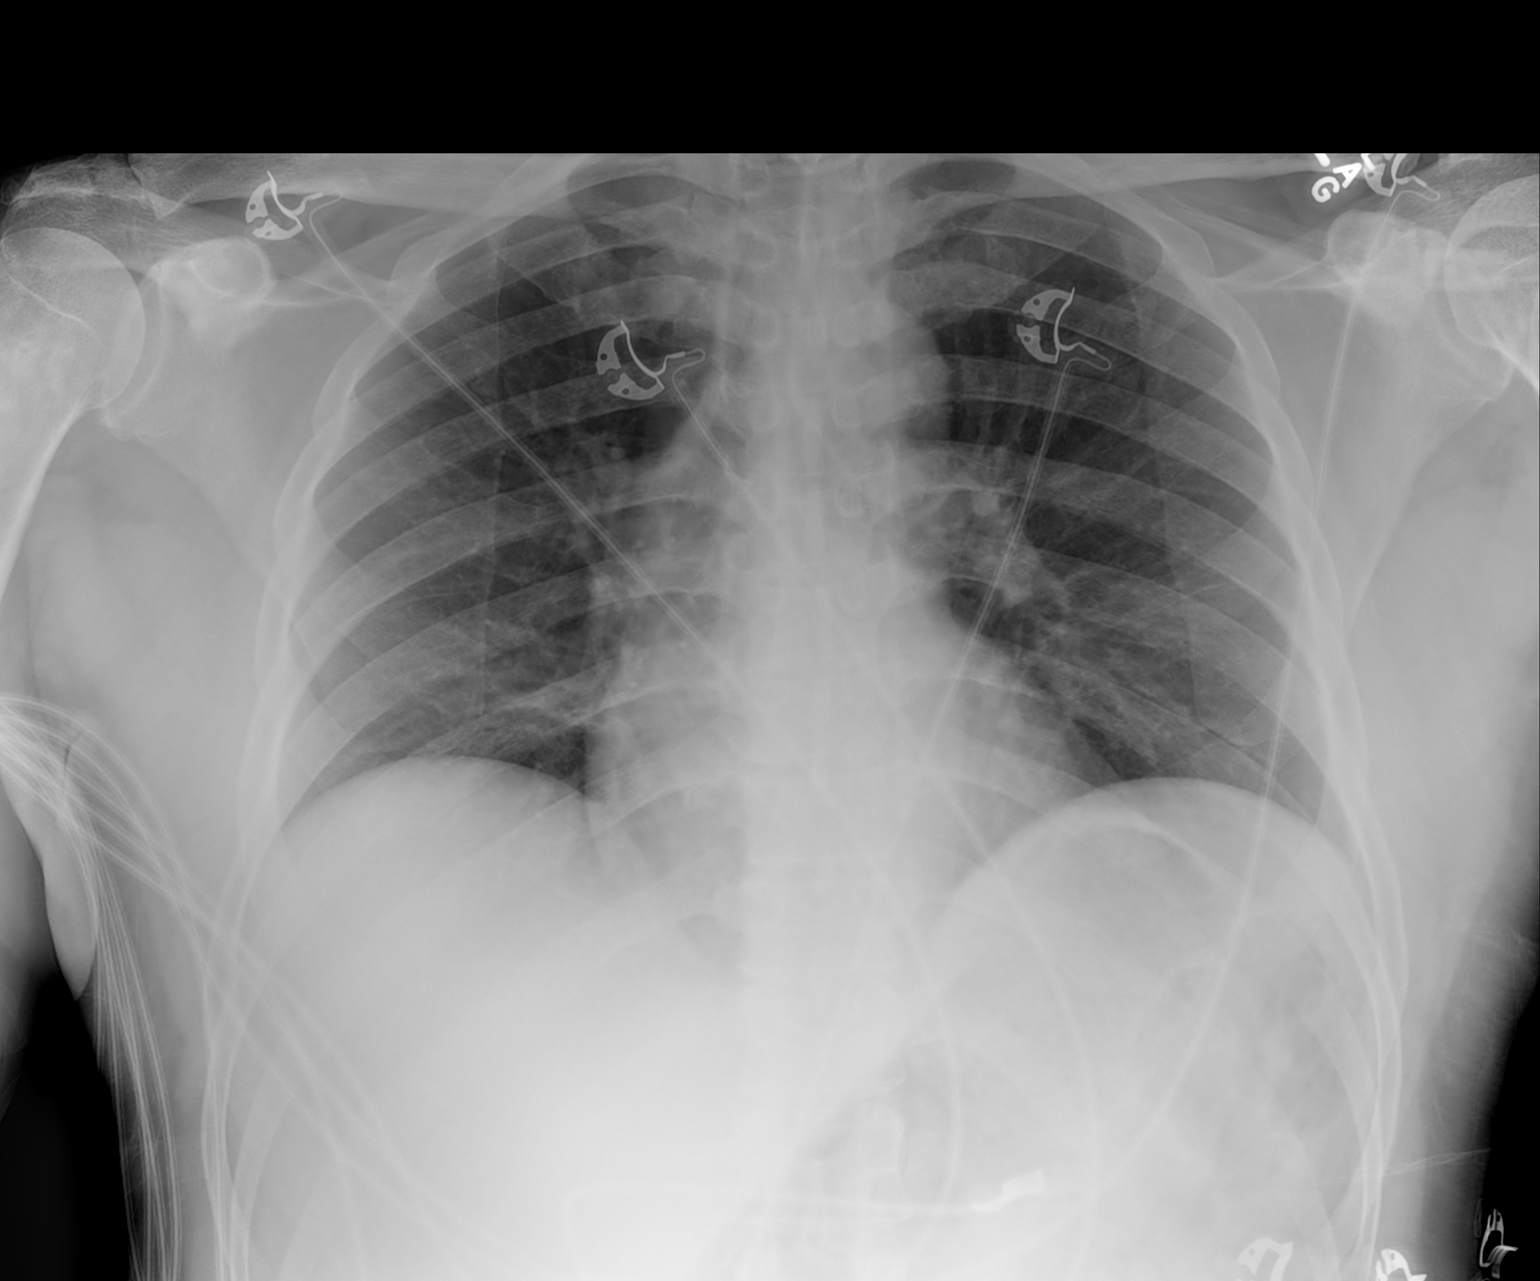

[1 of 1 positions shown; findings below may reference images not displayed]

FINDINGS: Low volume chest.  Basilar atelectasis.  No airspace
disease.  No effusion.  Cardiopericardial silhouette appears within
normal limits allowing for volumes. Right greater than left AC
joint osteoarthritis. Monitoring leads are projected over the
chest.
IMPRESSION: Low volume chest.

## 2013-11-24 ENCOUNTER — Telehealth: Payer: Self-pay | Admitting: *Deleted

## 2013-11-24 NOTE — Telephone Encounter (Signed)
Patient called requesting refills on indocin, flexeril, and allopurinol. He stated that Dr Eden Emms always refills these for him, but I am not finding any record of that in his chart. Please advise on this. Thanks, MI

## 2013-11-25 NOTE — Telephone Encounter (Signed)
Ok to refill these for a year

## 2013-11-26 ENCOUNTER — Other Ambulatory Visit: Payer: Self-pay | Admitting: *Deleted

## 2013-11-26 MED ORDER — INDOMETHACIN 25 MG PO CAPS: 25.0000 mg | ORAL_CAPSULE | Freq: Three times a day (TID) | ORAL | Status: DC

## 2013-11-26 MED ORDER — CYCLOBENZAPRINE HCL 10 MG PO TABS: 10.0000 mg | ORAL_TABLET | Freq: Three times a day (TID) | ORAL | Status: DC | PRN

## 2013-11-26 MED ORDER — ALLOPURINOL 300 MG PO TABS: 300.0000 mg | ORAL_TABLET | Freq: Every day | ORAL | Status: DC

## 2013-11-26 NOTE — Telephone Encounter (Signed)
Done

## 2014-09-20 ENCOUNTER — Other Ambulatory Visit: Payer: Self-pay | Admitting: Physician Assistant

## 2014-11-15 NOTE — Progress Notes (Signed)
Patient ID: Russell Carter, male   DOB: August 28, 1956, 59 y.o.   MRN: [id number] This is a  59 y.o.  African American male patient who has a history of non-ST elevation MI secondary to cocaine abuse. Cardiac cath in 2011 showed 50% LAD lesion. He also has history of hypertension, hyperlipidemia, and TIA.   F/U myovue 07/15/13  Overall Impression: Low risk stress nuclear study . There is mild attenuation of the mid lateral wall that is likely due to chest wall attenuation. There is no ischemia. .  LV Ejection Fraction: 64%. LV Wall Motion: NL LV Function; NL Wall Motion.  Since last visit has lost his job at [employer] and working at [employer] now  Has to walk 3.5 miles to work and back No further chest pain Has some neck and arm pain that Sounds more related to cervical or shoulder issues  Taking indocin and motrin for gout.    Not taking his diuretic  Needs [facility] f/u at [facility]   ROS: Denies fever, malais, weight loss, blurry vision, decreased visual acuity, cough, sputum, SOB, hemoptysis, pleuritic pain, palpitaitons, heartburn, abdominal pain, melena, lower extremity edema, claudication, or rash.  All other systems reviewed and negative  General: Affect appropriate Overweight black male  HEENT: normal Neck supple with no adenopathy JVP normal no bruits no thyromegaly Lungs clear with no wheezing and good diaphragmatic motion Heart:  S1/S2 no murmur, no rub, gallop or click PMI normal Abdomen: benighn, BS positve, no tenderness, no AAA no bruit.  No HSM or HJR Distal pulses intact with no bruits No edema Neuro non-focal Skin warm and dry No muscular weakness   Current Outpatient Prescriptions  Medication Sig Dispense Refill  . allopurinol (ZYLOPRIM) 300 MG tablet Take 1 tablet (300 mg total) by mouth daily. 30 tablet 11  . aspirin EC 325 MG tablet Take 325 mg by mouth daily.      Marland Kitchen CALCIUM-VITAMIN D PO Take 1 tablet by mouth daily.    . cyclobenzaprine (FLEXERIL)  10 MG tablet Take 1 tablet (10 mg total) by mouth 3 (three) times daily as needed. For muscle spasms 90 tablet 11  . folic acid (FOLVITE) 1 MG tablet Take 1 tablet (1 mg total) by mouth daily.    . hydrochlorothiazide (HYDRODIURIL) 25 MG tablet Take 25 mg by mouth daily.     Marland Kitchen ibuprofen (ADVIL,MOTRIN) 200 MG tablet Take 800 mg by mouth every 6 (six) hours as needed. For pain    . ibuprofen (ADVIL,MOTRIN) 800 MG tablet Take 1 tablet (800 mg total) by mouth every 8 (eight) hours as needed for pain. Take 1 tablet 3 times daily x 3 days, then use as needed every 6 hours for pain. 15 tablet 0  . indomethacin (INDOCIN) 25 MG capsule Take 1 capsule (25 mg total) by mouth 3 (three) times daily with meals. 90 capsule 11  . Iron-Vitamins (GERITOL PO) Take 2 tablets by mouth daily.    Marland Kitchen losartan (COZAAR) 100 MG tablet TAKE ONE TABLET BY MOUTH ONCE DAILY 30 tablet 0  . meclizine (ANTIVERT) 25 MG tablet Take 25 mg by mouth 3 (three) times daily as needed. For vertigo    . nitroGLYCERIN (NITROSTAT) 0.4 MG SL tablet Place 1 tablet (0.4 mg total) under the tongue every 5 (five) minutes as needed for chest pain. 20 tablet 0  . pravastatin (PRAVACHOL) 40 MG tablet Take 1 tablet (40 mg total) by mouth daily. 30 tablet 6  . thiamine 100  MG tablet Take 1 tablet (100 mg total) by mouth daily.    . [DISCONTINUED] famotidine (PEPCID) 20 MG tablet Take 1 tablet (20 mg total) by mouth 2 (two) times daily. 30 tablet 0  . [DISCONTINUED] TACRINE HYDROCHLORIDE PO Take 25 mg by mouth daily.       No current facility-administered medications for this visit.    Allergies  Shellfish allergy  Electrocardiogram:  07/15/13  SR rate 97 nonspecific ST/T wave changes  Today NSR rate 77  Normal improved from previous   Assessment and Plan

## 2014-11-16 ENCOUNTER — Other Ambulatory Visit: Payer: Self-pay | Admitting: *Deleted

## 2014-11-16 ENCOUNTER — Ambulatory Visit (INDEPENDENT_AMBULATORY_CARE_PROVIDER_SITE_OTHER): Payer: Medicaid Other | Admitting: Cardiovascular Disease

## 2014-11-16 ENCOUNTER — Encounter: Payer: Self-pay | Admitting: Cardiovascular Disease

## 2014-11-16 ENCOUNTER — Encounter: Payer: Self-pay | Admitting: *Deleted

## 2014-11-16 VITALS — BP 160/102 | HR 77 | Ht 71.0 in | Wt 210.8 lb

## 2014-11-16 DIAGNOSIS — R42 Dizziness and giddiness: Secondary | ICD-10-CM

## 2014-11-16 DIAGNOSIS — E7800 Pure hypercholesterolemia, unspecified: Secondary | ICD-10-CM

## 2014-11-16 DIAGNOSIS — M1000 Idiopathic gout, unspecified site: Secondary | ICD-10-CM

## 2014-11-16 DIAGNOSIS — M109 Gout, unspecified: Secondary | ICD-10-CM | POA: Insufficient documentation

## 2014-11-16 MED ORDER — NITROGLYCERIN 0.4 MG SL SUBL: 0.4000 mg | SUBLINGUAL_TABLET | SUBLINGUAL | Status: DC | PRN

## 2014-11-16 MED ORDER — ALLOPURINOL 300 MG PO TABS: 300.0000 mg | ORAL_TABLET | Freq: Every day | ORAL | Status: DC

## 2014-11-16 MED ORDER — PRAVASTATIN SODIUM 40 MG PO TABS: 40.0000 mg | ORAL_TABLET | Freq: Every day | ORAL | Status: DC

## 2014-11-16 MED ORDER — CYCLOBENZAPRINE HCL 10 MG PO TABS: 10.0000 mg | ORAL_TABLET | Freq: Three times a day (TID) | ORAL | Status: DC | PRN

## 2014-11-16 MED ORDER — LOSARTAN POTASSIUM 100 MG PO TABS: 100.0000 mg | ORAL_TABLET | Freq: Every day | ORAL | Status: DC

## 2014-11-16 MED ORDER — HYDROCHLOROTHIAZIDE 25 MG PO TABS: 25.0000 mg | ORAL_TABLET | Freq: Every day | ORAL | Status: DC

## 2014-11-16 NOTE — Patient Instructions (Signed)
Your physician recommends that you schedule a follow-up appointment in: NEXT  AVAILABLE WITH  DR NISHAN  Your physician recommends that you continue on your current medications as directed. Please refer to the Current Medication list given to you today. 

## 2014-11-16 NOTE — Assessment & Plan Note (Signed)
Needs labs rechecked on statin if he does not establish [facility] we can check next visit

## 2014-11-16 NOTE — Assessment & Plan Note (Signed)
Avoid NSAI's with HTN  Refill on allopurinol called in

## 2014-11-16 NOTE — Assessment & Plan Note (Signed)
Elevated not taking diuretic script called in  He has wrist cuff at home f/u with me next available to see if 3rd drug needed

## 2014-12-03 ENCOUNTER — Other Ambulatory Visit: Payer: Self-pay | Admitting: *Deleted

## 2014-12-03 MED ORDER — LOSARTAN POTASSIUM 100 MG PO TABS: 100.0000 mg | ORAL_TABLET | Freq: Every day | ORAL | Status: DC

## 2014-12-13 NOTE — Progress Notes (Signed)
Patient ID: Russell Carter, male   DOB: 07-23-1956, 60 y.o.   MRN: [id number] This is a  60 y.o.  African American male patient who has a history of non-ST elevation MI secondary to cocaine abuse. Cardiac cath in 2011 showed 50% LAD lesion. He also has history of hypertension, hyperlipidemia, and TIA.    F/U myovue 07/15/13  Overall Impression: Low risk stress nuclear study . There is mild attenuation of the mid lateral wall that is likely due to chest wall attenuation. There is no ischemia. .  LV Ejection Fraction: 64%. LV Wall Motion: NL LV Function; NL Wall Motion.  Working at [employer] now No further chest pain Has some neck and arm pain that Sounds more related to cervical or shoulder issues  Taking indocin and motrin for gout.    Compliant with meds Diet better has lost 6 lbs  Taking bus to work now  Needs primary care f/u at [facility]   ROS: Denies fever, malais, weight loss, blurry vision, decreased visual acuity, cough, sputum, SOB, hemoptysis, pleuritic pain, palpitaitons, heartburn, abdominal pain, melena, lower extremity edema, claudication, or rash.  All other systems reviewed and negative  General: Affect appropriate Overweight black male  HEENT: normal Neck supple with no adenopathy JVP normal no bruits no thyromegaly Lungs clear with no wheezing and good diaphragmatic motion Heart:  S1/S2 no murmur, no rub, gallop or click PMI normal Abdomen: benighn, BS positve, no tenderness, no AAA no bruit.  No HSM or HJR Distal pulses intact with no bruits No edema Neuro non-focal Skin warm and dry No muscular weakness   Current Outpatient Prescriptions  Medication Sig Dispense Refill  . allopurinol (ZYLOPRIM) 300 MG tablet Take 1 tablet (300 mg total) by mouth daily. 30 tablet 11  . aspirin EC 325 MG tablet Take 325 mg by mouth daily.      Marland Kitchen CALCIUM-VITAMIN D PO Take 1 tablet by mouth daily.    . cyclobenzaprine (FLEXERIL) 10 MG tablet Take 1 tablet (10 mg total) by  mouth 3 (three) times daily as needed. For muscle spasms 90 tablet 11  . folic acid (FOLVITE) 1 MG tablet Take 1 tablet (1 mg total) by mouth daily.    . hydrochlorothiazide (HYDRODIURIL) 25 MG tablet Take 1 tablet (25 mg total) by mouth daily. 30 tablet 11  . ibuprofen (ADVIL,MOTRIN) 800 MG tablet Take 1 tablet (800 mg total) by mouth every 8 (eight) hours as needed for pain. Take 1 tablet 3 times daily x 3 days, then use as needed every 6 hours for pain. 15 tablet 0  . indomethacin (INDOCIN) 25 MG capsule Take 1 capsule (25 mg total) by mouth 3 (three) times daily with meals. 90 capsule 11  . Iron-Vitamins (GERITOL PO) Take 2 tablets by mouth daily.    Marland Kitchen losartan (COZAAR) 100 MG tablet Take 1 tablet (100 mg total) by mouth daily. 30 tablet 11  . meclizine (ANTIVERT) 25 MG tablet Take 25 mg by mouth 3 (three) times daily as needed. For vertigo    . nitroGLYCERIN (NITROSTAT) 0.4 MG SL tablet Place 1 tablet (0.4 mg total) under the tongue every 5 (five) minutes as needed for chest pain. 25 tablet 4  . pravastatin (PRAVACHOL) 40 MG tablet Take 1 tablet (40 mg total) by mouth daily. 30 tablet 11  . [DISCONTINUED] famotidine (PEPCID) 20 MG tablet Take 1 tablet (20 mg total) by mouth 2 (two) times daily. 30 tablet 0  . [DISCONTINUED] TACRINE HYDROCHLORIDE PO Take 25  mg by mouth daily.       No current facility-administered medications for this visit.    Allergies  Shellfish allergy  Electrocardiogram:  07/15/13  SR rate 97 nonspecific ST/T wave changes  11/16/14  NSR rate 77  Normal improved from previous   Assessment and Plan

## 2014-12-14 ENCOUNTER — Encounter: Payer: Self-pay | Admitting: Cardiovascular Disease

## 2014-12-14 ENCOUNTER — Encounter: Payer: Self-pay | Admitting: *Deleted

## 2014-12-14 ENCOUNTER — Ambulatory Visit (INDEPENDENT_AMBULATORY_CARE_PROVIDER_SITE_OTHER): Payer: Medicaid Other | Admitting: Cardiovascular Disease

## 2014-12-14 VITALS — BP 132/90 | HR 88 | Ht 71.0 in | Wt 204.8 lb

## 2014-12-14 DIAGNOSIS — E7800 Pure hypercholesterolemia, unspecified: Secondary | ICD-10-CM

## 2014-12-14 DIAGNOSIS — I10 Essential (primary) hypertension: Secondary | ICD-10-CM

## 2014-12-14 DIAGNOSIS — I2583 Coronary atherosclerosis due to lipid rich plaque: Secondary | ICD-10-CM

## 2014-12-14 DIAGNOSIS — I441 Atrioventricular block, second degree: Secondary | ICD-10-CM

## 2014-12-14 DIAGNOSIS — I2510 Atherosclerotic heart disease of native coronary artery without angina pectoris: Secondary | ICD-10-CM

## 2014-12-14 NOTE — Patient Instructions (Signed)
Your physician wants you to follow-up in:  6 MONTHS WITH DR NISHAN  You will receive a reminder letter in the mail two months in advance. If you don't receive a letter, please call our office to schedule the follow-up appointment. Your physician recommends that you continue on your current medications as directed. Please refer to the Current Medication list given to you today. 

## 2014-12-14 NOTE — Assessment & Plan Note (Signed)
Stable with no angina and good activity level.  Continue medical Rx  

## 2014-12-14 NOTE — Assessment & Plan Note (Signed)
Well controlled.  Continue current medications and low sodium Dash type diet.    

## 2014-12-14 NOTE — Assessment & Plan Note (Signed)
Stable no high grade heart block f/u yearly ECG

## 2014-12-14 NOTE — Assessment & Plan Note (Signed)
Cholesterol is at goal.  Continue current dose of statin and diet Rx.  No myalgias or side effects.  F/U  LFT's in 6 months.

## 2015-02-04 ENCOUNTER — Emergency Department (HOSPITAL_COMMUNITY)
Admission: EM | Admit: 2015-02-04 | Discharge: 2015-02-04 | Disposition: A | Discharge status: Home / Self Care | Payer: Medicaid Other | Attending: Emergency Medicine | Admitting: Emergency Medicine

## 2015-02-04 ENCOUNTER — Encounter (HOSPITAL_COMMUNITY): Payer: Self-pay | Admitting: *Deleted

## 2015-02-04 ENCOUNTER — Emergency Department (HOSPITAL_COMMUNITY): Payer: Medicaid Other

## 2015-02-04 DIAGNOSIS — Z8701 Personal history of pneumonia (recurrent): Secondary | ICD-10-CM | POA: Insufficient documentation

## 2015-02-04 DIAGNOSIS — Z8659 Personal history of other mental and behavioral disorders: Secondary | ICD-10-CM | POA: Insufficient documentation

## 2015-02-04 DIAGNOSIS — I252 Old myocardial infarction: Secondary | ICD-10-CM | POA: Diagnosis not present

## 2015-02-04 DIAGNOSIS — R109 Unspecified abdominal pain: Secondary | ICD-10-CM

## 2015-02-04 DIAGNOSIS — K921 Melena: Secondary | ICD-10-CM | POA: Insufficient documentation

## 2015-02-04 DIAGNOSIS — R05 Cough: Secondary | ICD-10-CM | POA: Diagnosis not present

## 2015-02-04 DIAGNOSIS — Z9889 Other specified postprocedural states: Secondary | ICD-10-CM | POA: Diagnosis not present

## 2015-02-04 DIAGNOSIS — M109 Gout, unspecified: Secondary | ICD-10-CM | POA: Insufficient documentation

## 2015-02-04 DIAGNOSIS — R059 Cough, unspecified: Secondary | ICD-10-CM

## 2015-02-04 DIAGNOSIS — N400 Enlarged prostate without lower urinary tract symptoms: Secondary | ICD-10-CM | POA: Insufficient documentation

## 2015-02-04 DIAGNOSIS — Z7982 Long term (current) use of aspirin: Secondary | ICD-10-CM | POA: Diagnosis not present

## 2015-02-04 DIAGNOSIS — Z8673 Personal history of transient ischemic attack (TIA), and cerebral infarction without residual deficits: Secondary | ICD-10-CM | POA: Insufficient documentation

## 2015-02-04 DIAGNOSIS — E780 Pure hypercholesterolemia: Secondary | ICD-10-CM | POA: Diagnosis not present

## 2015-02-04 DIAGNOSIS — Z791 Long term (current) use of non-steroidal anti-inflammatories (NSAID): Secondary | ICD-10-CM | POA: Insufficient documentation

## 2015-02-04 DIAGNOSIS — M1990 Unspecified osteoarthritis, unspecified site: Secondary | ICD-10-CM | POA: Insufficient documentation

## 2015-02-04 DIAGNOSIS — Z79899 Other long term (current) drug therapy: Secondary | ICD-10-CM | POA: Diagnosis not present

## 2015-02-04 DIAGNOSIS — I10 Essential (primary) hypertension: Secondary | ICD-10-CM | POA: Insufficient documentation

## 2015-02-04 DIAGNOSIS — IMO0001 Reserved for inherently not codable concepts without codable children: Secondary | ICD-10-CM

## 2015-02-04 DIAGNOSIS — G43909 Migraine, unspecified, not intractable, without status migrainosus: Secondary | ICD-10-CM | POA: Diagnosis not present

## 2015-02-04 DIAGNOSIS — M545 Low back pain: Secondary | ICD-10-CM | POA: Diagnosis not present

## 2015-02-04 DIAGNOSIS — R030 Elevated blood-pressure reading, without diagnosis of hypertension: Secondary | ICD-10-CM

## 2015-02-04 HISTORY — DX: Headache, unspecified: R51.9

## 2015-02-04 HISTORY — DX: Headache: R51

## 2015-02-04 LAB — URINALYSIS, ROUTINE W REFLEX MICROSCOPIC
BILIRUBIN URINE: NEGATIVE
Glucose, UA: NEGATIVE mg/dL
HGB URINE DIPSTICK: NEGATIVE
Ketones, ur: NEGATIVE mg/dL
Leukocytes, UA: NEGATIVE
Nitrite: NEGATIVE
PROTEIN: NEGATIVE mg/dL
Specific Gravity, Urine: 1.02 (ref 1.005–1.030)
UROBILINOGEN UA: 1 mg/dL (ref 0.0–1.0)
pH: 5.5 (ref 5.0–8.0)

## 2015-02-04 LAB — COMPREHENSIVE METABOLIC PANEL
ALBUMIN: 3.6 g/dL (ref 3.5–5.2)
ALT: 40 U/L (ref 0–53)
AST: 41 U/L — AB (ref 0–37)
Alkaline Phosphatase: 43 U/L (ref 39–117)
Anion gap: 11 (ref 5–15)
BILIRUBIN TOTAL: 0.6 mg/dL (ref 0.3–1.2)
BUN: 15 mg/dL (ref 6–23)
CHLORIDE: 106 mmol/L (ref 96–112)
CO2: 20 mmol/L (ref 19–32)
Calcium: 9 mg/dL (ref 8.4–10.5)
Creatinine, Ser: 1.14 mg/dL (ref 0.50–1.35)
GFR calc Af Amer: 80 mL/min — ABNORMAL LOW (ref >90)
GFR calc non Af Amer: 69 mL/min — ABNORMAL LOW (ref >90)
Glucose, Bld: 98 mg/dL (ref 70–99)
POTASSIUM: 4 mmol/L (ref 3.5–5.1)
SODIUM: 137 mmol/L (ref 135–145)
Total Protein: 6.8 g/dL (ref 6.0–8.3)

## 2015-02-04 LAB — LIPASE, BLOOD: Lipase: 21 U/L (ref 11–59)

## 2015-02-04 LAB — CBC WITH DIFFERENTIAL/PLATELET
Basophils Absolute: 0 10*3/uL (ref 0.0–0.1)
Basophils Relative: 0 % (ref 0–1)
EOS PCT: 2 % (ref 0–5)
Eosinophils Absolute: 0.1 10*3/uL (ref 0.0–0.7)
HCT: 45.9 % (ref 39.0–52.0)
Hemoglobin: 15.2 g/dL (ref 13.0–17.0)
LYMPHS ABS: 1.4 10*3/uL (ref 0.7–4.0)
Lymphocytes Relative: 43 % (ref 12–46)
MCH: 30.3 pg (ref 26.0–34.0)
MCHC: 33.1 g/dL (ref 30.0–36.0)
MCV: 91.4 fL (ref 78.0–100.0)
Monocytes Absolute: 0.3 10*3/uL (ref 0.1–1.0)
Monocytes Relative: 10 % (ref 3–12)
Neutro Abs: 1.5 10*3/uL — ABNORMAL LOW (ref 1.7–7.7)
Neutrophils Relative %: 45 % (ref 43–77)
Platelets: 200 10*3/uL (ref 150–400)
RBC: 5.02 MIL/uL (ref 4.22–5.81)
RDW: 14.5 % (ref 11.5–15.5)
WBC: 3.3 10*3/uL — ABNORMAL LOW (ref 4.0–10.5)

## 2015-02-04 LAB — POC OCCULT BLOOD, ED: Fecal Occult Bld: NEGATIVE

## 2015-02-04 MED ORDER — SODIUM CHLORIDE 0.9 % IV SOLN
INTRAVENOUS | Status: DC
  Administered 2015-02-04: 08:00:00 via INTRAVENOUS

## 2015-02-04 MED ORDER — ONDANSETRON HCL 4 MG/2ML IJ SOLN
4.0000 mg | Freq: Once | INTRAMUSCULAR | Status: AC
  Administered 2015-02-04: 4 mg via INTRAVENOUS
  Filled 2015-02-04: qty 2

## 2015-02-04 MED ORDER — HYDROCODONE-ACETAMINOPHEN 5-325 MG PO TABS: ORAL_TABLET | ORAL | Status: DC

## 2015-02-04 MED ORDER — OXYCODONE-ACETAMINOPHEN 5-325 MG PO TABS
1.0000 | ORAL_TABLET | Freq: Once | ORAL | Status: AC
Start: 2015-02-04 — End: 2015-02-04
  Administered 2015-02-04: 1 via ORAL
  Filled 2015-02-04: qty 1

## 2015-02-04 MED ORDER — OXYCODONE-ACETAMINOPHEN 5-325 MG PO TABS: ORAL_TABLET | ORAL | Status: DC

## 2015-02-04 MED ORDER — MORPHINE SULFATE 4 MG/ML IJ SOLN
4.0000 mg | Freq: Once | INTRAMUSCULAR | Status: AC
  Administered 2015-02-04: 4 mg via INTRAVENOUS
  Filled 2015-02-04: qty 1

## 2015-02-04 NOTE — Discharge Instructions (Signed)
Your Doctor's office has changed location:  [address]  [phone]  Your doctor's office is a walk-in clinic you do not need an appointment go earlier in the morning to assure being seen the same day.  Your blood pressure today is significantly elevated, cut down on salt and let your primary care doctor know.   You're due for a colonoscopy your primary care physician will have to refer you to a gastroenterologist.  Take percocet for breakthrough pain, do not drink alcohol, drive, care for children or do other critical tasks while taking percocet.  Please be very careful not to fall! The pain medication and back painputs you at risk for falls. Please rest as much as possible and try to not stay alone.   Please follow with your primary care doctor in the next 2 days for a check-up. They must obtain records for further management.   Do not hesitate to return to the Emergency Department for any new, worsening or concerning symptoms.

## 2015-02-04 NOTE — ED Provider Notes (Signed)
CSN: [id number]     Arrival date & time 02/04/15  4098 History   None    Chief Complaint  Patient presents with  . Flank Pain  . Blood In Stools     (Consider location/radiation/quality/duration/timing/severity/associated sxs/prior Treatment) HPI   Russell Carter is a 59 y.o. male complaining of atraumatic, left low back pain exacerbated by movement, lying flat and coughing onset 1 week ago, rated at 10 out of 10. She's never had a pain like this before. Patient reports difficulty initiating urine stream x1 week, blood in stool starting 2 weeks ago, productive cough 1 week. Patient denies fever, chills, nausea, vomiting, chest pain, shortness of breath, abdominal pain, dysuria, hematuria. Patient never had a colonoscopy. He has been trying to reach his primary care Dr. for 2 weeks but no one answers the phone.   Past Medical History  Diagnosis Date  . Hyperglycemia   . Gout, unspecified   . Hypercholesterolemia   . HTN (hypertension)   . Blood in stool   . Migraines   . Stroke   . Arthritis   . Pneumonia   . Depression   . Myocardial infarction     drug cocaine and marijuana at time of mi none  since    . Cocaine abuse     history  . Normal cardiac stress test 07/2013  . Headache    Past Surgical History  Procedure Laterality Date  . Reapir of left inguinal hernia  02/02/2003    Dr. Violeta Gelinas. repair with mesh. Same procedure done again in  05/22/2004   . Rotator cuff repair  2010    left   . Cardiac catheterization  2008    [place]-40%lad  . Reconstruct / stabilize distal ulna    . Inguinal hernia repair  05/12/2012    Procedure: HERNIA REPAIR INGUINAL ADULT;  Surgeon: Liz Malady, MD;  Location: [facility];  Service: General;  Laterality: Right;  . Hernia repair     Family History  Problem Relation Age of Onset  . Heart attack Mother     x7. Still alive  . Heart attack Father     deceased b/c of MI  . Heart attack Brother     older,  deceased b/c of MI    History  Substance Use Topics  . Smoking status: Never Smoker   . Smokeless tobacco: Never Used  . Alcohol Use: 4.2 oz/week    7 Cans of beer per week     Comment: occasional beer    Review of Systems  10 systems reviewed and found to be negative, except as noted in the HPI.  Allergies  Shellfish allergy  Home Medications   Prior to Admission medications   Medication Sig Start Date End Date Taking? Authorizing Provider  allopurinol (ZYLOPRIM) 300 MG tablet Take 1 tablet (300 mg total) by mouth daily. 11/16/14  Yes Wendall Stade, MD  aspirin EC 325 MG tablet Take 325 mg by mouth daily.     Yes Historical Provider, MD  CALCIUM-VITAMIN D PO Take 1 tablet by mouth daily.   Yes Historical Provider, MD  cyclobenzaprine (FLEXERIL) 10 MG tablet Take 1 tablet (10 mg total) by mouth 3 (three) times daily as needed. For muscle spasms 11/16/14  Yes Wendall Stade, MD  folic acid (FOLVITE) 1 MG tablet Take 1 tablet (1 mg total) by mouth daily. 01/16/13  Yes Rodolph Bong, MD  hydrochlorothiazide (HYDRODIURIL) 25 MG tablet Take 1 tablet (  25 mg total) by mouth daily. 11/16/14  Yes Wendall Stade, MD  ibuprofen (ADVIL,MOTRIN) 800 MG tablet Take 1 tablet (800 mg total) by mouth every 8 (eight) hours as needed for pain. Take 1 tablet 3 times daily x 3 days, then use as needed every 6 hours for pain. 01/16/13  Yes Rodolph Bong, MD  indomethacin (INDOCIN) 25 MG capsule Take 1 capsule (25 mg total) by mouth 3 (three) times daily with meals. 11/26/13  Yes Wendall Stade, MD  Iron-Vitamins (GERITOL PO) Take 2 tablets by mouth daily.   Yes Historical Provider, MD  losartan (COZAAR) 100 MG tablet Take 1 tablet (100 mg total) by mouth daily. 12/03/14  Yes Wendall Stade, MD  meclizine (ANTIVERT) 25 MG tablet Take 25 mg by mouth 3 (three) times daily as needed. For vertigo   Yes Historical Provider, MD  pravastatin (PRAVACHOL) 40 MG tablet Take 1 tablet (40 mg total) by mouth daily.  11/16/14  Yes Wendall Stade, MD  nitroGLYCERIN (NITROSTAT) 0.4 MG SL tablet Place 1 tablet (0.4 mg total) under the tongue every 5 (five) minutes as needed for chest pain. 11/16/14   Wendall Stade, MD  oxyCODONE-acetaminophen (PERCOCET/ROXICET) 5-325 MG per tablet 1 to 2 tabs PO q6hrs  PRN for pain 02/04/15   Joni Reining Daivion Pape, PA-C   BP 175/97 mmHg  Pulse 73  Temp(Src) 98 F (36.7 C) (Oral)  Resp 17  Ht  (1.803 m)  Wt 204 lb (92.534 kg)  BMI 28.46 kg/m2  SpO2 99% Physical Exam  Constitutional: He is oriented to person, place, and time. He appears well-developed and well-nourished. No distress.  HENT:  Head: Normocephalic and atraumatic.  Mouth/Throat: Oropharynx is clear and moist.  Eyes: Conjunctivae and EOM are normal. Pupils are equal, round, and reactive to light.  Neck: Normal range of motion.  Cardiovascular: Normal rate, regular rhythm and intact distal pulses.   Pulmonary/Chest: Effort normal and breath sounds normal. No stridor. No respiratory distress. He has no wheezes. He has no rales. He exhibits no tenderness.  Abdominal: Soft. He exhibits no distension. There is no tenderness.  Genitourinary:  No CVA tenderness to palpation.  Digital rectal exam is chaperoned by technician: No rashes or lesions, good rectal tone, normal stool color, no significant stool burden in the rectal vault.  Musculoskeletal: Normal range of motion.       Arms: Neurological: He is alert and oriented to person, place, and time.  Psychiatric: He has a normal mood and affect.  Nursing note and vitals reviewed.   ED Course  Procedures (including critical care time) Labs Review Labs Reviewed  COMPREHENSIVE METABOLIC PANEL - Abnormal; Notable for the following:    AST 41 (*)    GFR calc non Af Amer 69 (*)    GFR calc Af Amer 80 (*)    All other components within normal limits  CBC WITH DIFFERENTIAL/PLATELET - Abnormal; Notable for the following:    WBC 3.3 (*)    Neutro Abs 1.5 (*)     All other components within normal limits  URINE CULTURE  URINALYSIS, ROUTINE W REFLEX MICROSCOPIC  LIPASE, BLOOD  POC OCCULT BLOOD, ED    Imaging Review No results found.   EKG Interpretation None      MDM   Final diagnoses:  Cough  Right flank pain  Prostatic hypertrophy  Elevated blood pressure    Filed Vitals:   02/04/15 0830 02/04/15 0845 02/04/15 0900 02/04/15 1044  BP:  181/106 173/97 170/105 175/97  Pulse: 74 74 76 73  Temp:    98 F (36.7 C)  TempSrc:    Oral  Resp:    17  Height:      Weight:      SpO2: 96% 95% 97% 99%    Medications  morphine 4 MG/ML injection 4 mg (4 mg Intravenous Given 02/04/15 0734)  ondansetron (ZOFRAN) injection 4 mg (4 mg Intravenous Given 02/04/15 0734)  oxyCODONE-acetaminophen (PERCOCET/ROXICET) 5-325 MG per tablet 1 tablet (1 tablet Oral Given 02/04/15 1047)    Russell Carter is a pleasant 59 y.o. male presenting with right low back pain, this is positional, he is tender to palpation. Patient states he's having difficulty initiating his urine stream, no symptoms of UTI. Said that he has blood in his stool, he's never had a colonoscopy.  Blood work with no significant abnormalities, digital rectal exam is normal and guaiac is negative. Urinalysis is without signs of infection. Chest x-ray is negative and also patient CT abdomen pelvis with normal caliber aorta. Prostatic hypertrophy is noted with mild indentation of the bladder. Advised him to discuss this and elevated BP with PCP.  Attempted to call the patient's primary care office at [facility]. medical clinic at [address] [facility] market. I've reached them and appears they have shut down that location, will give the patient the new address.  Evaluation does not show pathology that would require ongoing emergent intervention or inpatient treatment. Pt is hemodynamically stable and mentating appropriately. Discussed findings and plan with patient/guardian, who agrees with care plan. All  questions answered. Return precautions discussed and outpatient follow up given.   Discharge Medication List as of 02/04/2015 10:41 AM    START taking these medications   Details  oxyCODONE-acetaminophen (PERCOCET/ROXICET) 5-325 MG per tablet 1 to 2 tabs PO q6hrs  PRN for pain, Print             Wynetta Emery, PA-C 02/07/15 0855  Samuel Jester, DO 02/07/15 2045

## 2015-02-04 NOTE — ED Notes (Signed)
Pt. Has had right flank pain for a week with the pain increasing tonight to the point the pt. Couldn't lay down. Pt. Reports bloody stools and difficulty urinating

## 2015-02-05 LAB — URINE CULTURE: Colony Count: 2000

## 2015-03-16 ENCOUNTER — Emergency Department (HOSPITAL_COMMUNITY): Payer: Medicaid Other

## 2015-03-16 ENCOUNTER — Encounter (HOSPITAL_COMMUNITY): Payer: Self-pay | Admitting: Emergency Medicine

## 2015-03-16 ENCOUNTER — Emergency Department (HOSPITAL_COMMUNITY)
Admission: EM | Admit: 2015-03-16 | Discharge: 2015-03-16 | Disposition: A | Discharge status: Home / Self Care | Payer: Medicaid Other | Attending: Emergency Medicine | Admitting: Emergency Medicine

## 2015-03-16 DIAGNOSIS — E780 Pure hypercholesterolemia: Secondary | ICD-10-CM | POA: Diagnosis not present

## 2015-03-16 DIAGNOSIS — Y9301 Activity, walking, marching and hiking: Secondary | ICD-10-CM | POA: Insufficient documentation

## 2015-03-16 DIAGNOSIS — M109 Gout, unspecified: Secondary | ICD-10-CM | POA: Diagnosis not present

## 2015-03-16 DIAGNOSIS — Z79899 Other long term (current) drug therapy: Secondary | ICD-10-CM | POA: Diagnosis not present

## 2015-03-16 DIAGNOSIS — G43909 Migraine, unspecified, not intractable, without status migrainosus: Secondary | ICD-10-CM | POA: Insufficient documentation

## 2015-03-16 DIAGNOSIS — Z8701 Personal history of pneumonia (recurrent): Secondary | ICD-10-CM | POA: Insufficient documentation

## 2015-03-16 DIAGNOSIS — R200 Anesthesia of skin: Secondary | ICD-10-CM | POA: Diagnosis not present

## 2015-03-16 DIAGNOSIS — Z791 Long term (current) use of non-steroidal anti-inflammatories (NSAID): Secondary | ICD-10-CM | POA: Diagnosis not present

## 2015-03-16 DIAGNOSIS — I252 Old myocardial infarction: Secondary | ICD-10-CM | POA: Diagnosis not present

## 2015-03-16 DIAGNOSIS — S99911A Unspecified injury of right ankle, initial encounter: Secondary | ICD-10-CM | POA: Diagnosis not present

## 2015-03-16 DIAGNOSIS — Z9889 Other specified postprocedural states: Secondary | ICD-10-CM | POA: Diagnosis not present

## 2015-03-16 DIAGNOSIS — Y998 Other external cause status: Secondary | ICD-10-CM | POA: Diagnosis not present

## 2015-03-16 DIAGNOSIS — Z8673 Personal history of transient ischemic attack (TIA), and cerebral infarction without residual deficits: Secondary | ICD-10-CM | POA: Insufficient documentation

## 2015-03-16 DIAGNOSIS — S199XXA Unspecified injury of neck, initial encounter: Secondary | ICD-10-CM | POA: Diagnosis not present

## 2015-03-16 DIAGNOSIS — Y9289 Other specified places as the place of occurrence of the external cause: Secondary | ICD-10-CM | POA: Insufficient documentation

## 2015-03-16 DIAGNOSIS — S0990XA Unspecified injury of head, initial encounter: Secondary | ICD-10-CM

## 2015-03-16 DIAGNOSIS — I10 Essential (primary) hypertension: Secondary | ICD-10-CM | POA: Insufficient documentation

## 2015-03-16 DIAGNOSIS — R61 Generalized hyperhidrosis: Secondary | ICD-10-CM | POA: Diagnosis not present

## 2015-03-16 DIAGNOSIS — M25571 Pain in right ankle and joints of right foot: Secondary | ICD-10-CM

## 2015-03-16 DIAGNOSIS — Z8719 Personal history of other diseases of the digestive system: Secondary | ICD-10-CM | POA: Diagnosis not present

## 2015-03-16 DIAGNOSIS — R55 Syncope and collapse: Secondary | ICD-10-CM | POA: Insufficient documentation

## 2015-03-16 DIAGNOSIS — Z7982 Long term (current) use of aspirin: Secondary | ICD-10-CM | POA: Diagnosis not present

## 2015-03-16 DIAGNOSIS — W01198A Fall on same level from slipping, tripping and stumbling with subsequent striking against other object, initial encounter: Secondary | ICD-10-CM | POA: Insufficient documentation

## 2015-03-16 DIAGNOSIS — Z8659 Personal history of other mental and behavioral disorders: Secondary | ICD-10-CM | POA: Diagnosis not present

## 2015-03-16 MED ORDER — MORPHINE SULFATE 4 MG/ML IJ SOLN
4.0000 mg | Freq: Once | INTRAMUSCULAR | Status: AC
  Administered 2015-03-16: 4 mg via INTRAVENOUS
  Filled 2015-03-16: qty 1

## 2015-03-16 MED ORDER — ONDANSETRON HCL 4 MG/2ML IJ SOLN
4.0000 mg | Freq: Once | INTRAMUSCULAR | Status: AC
  Administered 2015-03-16: 4 mg via INTRAVENOUS
  Filled 2015-03-16: qty 2

## 2015-03-16 MED ORDER — IBUPROFEN 800 MG PO TABS: 800.0000 mg | ORAL_TABLET | Freq: Three times a day (TID) | ORAL | Status: DC

## 2015-03-16 NOTE — ED Provider Notes (Signed)
9:10 AM Patient seen with Tapia PA-C. Patient presents with headache, blurry vision, right ankle pain after a fall just prior to arrival. Patient also complains of neck pain. He was placed in a c-collar prior to arrival. He had CP right after falling but no pain since. He also felt dizzy (spinning) so he took a nitro glycerin.   Exam: Gen NAD; ENT PERRL, c-spine immobilized; Heart RRR, nml S1,S2, no m/r/g; Lungs CTAB; Abd soft, NT, no rebound or guarding; Ext 2+ pedal pulses bilaterally, generalized tenderness of R ankle and pain with any movement; Neuro CN II-XII grossly intact, normal distal sensation, nml coordination.  Plan: imaging, EKG.    Renne Crigler, PA-C 03/16/15 1552  Geoffery Lyons, MD 03/18/15 [phone]

## 2015-03-16 NOTE — ED Provider Notes (Signed)
CSN: [id number]     Arrival date & time 03/16/15  2440 History   First MD Initiated Contact with Patient 03/16/15 0820     Chief Complaint  Patient presents with  . Fall     (Consider location/radiation/quality/duration/timing/severity/associated sxs/prior Treatment) HPI   [name] is a 59 year old man with history of gout, poorly controlled HTN, reported borderline DM, CAD, and vertigo who presented to the ER this morning after a mechanical fall with head injury. He was walking to work this morning, when he accidentally stepped in a deep hole, which caused him to fall and strike his left shoulder and back of head on the curb.  He denies any loss of consciousness, and was able to finishing walking to work.  Roughly two hours later, he had increasing pain in his right ankle, neck and head, with associated lightheadedness, dizziness and blurry vision to the point where he had to lay on the floor.  He reports profuse sweating and a 1 minute period of loss of consciousness.  Currently his head and neck hurt, and C-collar is making it worse. His ankle is extremely tender to the touch and pain increases with any movement.  He denies nausea, vomiting, headache, abdominal pain, and chest pain.  Has multiple vague complaints about numbness, tingling, and whole body weakness.      Past Medical History  Diagnosis Date  . Hyperglycemia   . Gout, unspecified   . Hypercholesterolemia   . HTN (hypertension)   . Blood in stool   . Migraines   . Stroke   . Arthritis   . Pneumonia   . Depression   . Myocardial infarction     drug cocaine and marijuana at time of mi none  since    . Cocaine abuse     history  . Normal cardiac stress test 07/2013  . Headache    Past Surgical History  Procedure Laterality Date  . Reapir of left inguinal hernia  02/02/2003    Dr. Violeta Gelinas. repair with mesh. Same procedure done again in  05/22/2004   . Rotator cuff repair  2010    left   . Cardiac  catheterization  2008    [place]-40%lad  . Reconstruct / stabilize distal ulna    . Inguinal hernia repair  05/12/2012    Procedure: HERNIA REPAIR INGUINAL ADULT;  Surgeon: Liz Malady, MD;  Location: [facility];  Service: General;  Laterality: Right;  . Hernia repair     Family History  Problem Relation Age of Onset  . Heart attack Mother     x7. Still alive  . Heart attack Father     deceased b/c of MI  . Heart attack Brother     older, deceased b/c of MI    History  Substance Use Topics  . Smoking status: Never Smoker   . Smokeless tobacco: Never Used  . Alcohol Use: 4.2 oz/week    7 Cans of beer per week     Comment: occasional beer    Review of Systems  Constitutional: Positive for diaphoresis. Negative for fatigue.  Eyes: Positive for visual disturbance. Negative for photophobia, pain, discharge and redness.  Respiratory: Negative for shortness of breath.   Cardiovascular: Negative for chest pain and palpitations.  Gastrointestinal: Negative for nausea, vomiting, abdominal pain and diarrhea.  Neurological: Positive for dizziness, syncope, weakness, light-headedness, numbness and headaches. Negative for tremors, seizures, facial asymmetry and speech difficulty.  Psychiatric/Behavioral: Positive for decreased  concentration.      Allergies  Shellfish allergy  Home Medications   Prior to Admission medications   Medication Sig Start Date End Date Taking? Authorizing Provider  allopurinol (ZYLOPRIM) 300 MG tablet Take 1 tablet (300 mg total) by mouth daily. 11/16/14  Yes Wendall Stade, MD  aspirin EC 325 MG tablet Take 325 mg by mouth daily.     Yes Historical Provider, MD  CALCIUM-VITAMIN D PO Take 1 tablet by mouth daily.   Yes Historical Provider, MD  cyclobenzaprine (FLEXERIL) 10 MG tablet Take 1 tablet (10 mg total) by mouth 3 (three) times daily as needed. For muscle spasms 11/16/14  Yes Wendall Stade, MD  hydrochlorothiazide (HYDRODIURIL) 25  MG tablet Take 1 tablet (25 mg total) by mouth daily. 11/16/14  Yes Wendall Stade, MD  indomethacin (INDOCIN) 25 MG capsule Take 1 capsule (25 mg total) by mouth 3 (three) times daily with meals. 11/26/13  Yes Wendall Stade, MD  Iron-Vitamins (GERITOL PO) Take 2 tablets by mouth daily.   Yes Historical Provider, MD  losartan (COZAAR) 100 MG tablet Take 1 tablet (100 mg total) by mouth daily. 12/03/14  Yes Wendall Stade, MD  meclizine (ANTIVERT) 25 MG tablet Take 25 mg by mouth 3 (three) times daily as needed. For vertigo   Yes Historical Provider, MD  nitroGLYCERIN (NITROSTAT) 0.4 MG SL tablet Place 1 tablet (0.4 mg total) under the tongue every 5 (five) minutes as needed for chest pain. 11/16/14  Yes Wendall Stade, MD  pravastatin (PRAVACHOL) 40 MG tablet Take 1 tablet (40 mg total) by mouth daily. 11/16/14  Yes Wendall Stade, MD   BP 156/82 mmHg  Pulse 84  Temp(Src) 98.8 F (37.1 C)  Resp 19  Ht  (1.803 m)  Wt 204 lb (92.534 kg)  BMI 28.46 kg/m2  SpO2 96% Physical Exam  Constitutional: He is oriented to person, place, and time. He appears well-developed and well-nourished.  appears uncomfortable  HENT:  Head: Normocephalic and atraumatic. Head is without raccoon's eyes and without Battle's sign.  Nose: Nose normal.  Eyes: Conjunctivae are normal. Right eye exhibits no discharge. Left eye exhibits no discharge. No scleral icterus.  Pupils 1-3mm and minimally reactive to light  Neck: No JVD present.  Cardiovascular: Normal rate, regular rhythm, normal heart sounds and intact distal pulses.  Exam reveals no gallop and no friction rub.   No murmur heard. Pulmonary/Chest: Effort normal and breath sounds normal. No stridor. No respiratory distress. He has no wheezes. He has no rales. He exhibits no tenderness.  Abdominal: Soft. Bowel sounds are normal. He exhibits no distension and no mass. There is no tenderness. There is no rebound and no guarding.  Musculoskeletal: He exhibits no  edema.       Left shoulder: He exhibits normal range of motion, no bony tenderness, no swelling, no deformity, no pain and normal strength.       Right ankle: He exhibits decreased range of motion and swelling. He exhibits no ecchymosis, no deformity, no laceration and normal pulse. Tenderness. Lateral malleolus tenderness found. No medial malleolus tenderness found. Achilles tendon exhibits pain. Achilles tendon exhibits no defect.       Cervical back: He exhibits pain. He exhibits no bony tenderness.  Lymphadenopathy:    He has no cervical adenopathy.  Neurological: He is alert and oriented to person, place, and time. He has normal reflexes. No cranial nerve deficit or sensory deficit.  Skin: Skin is  warm and dry. No rash noted. He is not diaphoretic. No erythema. No pallor.  Psychiatric: Judgment normal.  Nursing note and vitals reviewed.   ED Course  Procedures (including critical care time) Labs Review Labs Reviewed - No data to display  Imaging Review No results found.   EKG Interpretation None      MDM   Final diagnoses:  Ankle pain, right  Closed head injury  Closed head injury   Unable to clear C-spine with nexus criteria, he is unable to perform multiple parts of neuro exam, for example cannot do EOM's for exam, but at other times is able to.   His right lateral malleolus is minimally swollen when compared to leftand diffusely tender to palpation. Will get ankle xray, head and neck CT and EKG.  Ankle film and CT head/neck negative - no evidence of skull fx or bleed.  Exam of head and neck, once C-collar was removed was completely normal, no abrasions or bruise indicating where he may have struck his head.  Discussed possibility of concussion with patient and return precautions and printed instructions were given. PT was placed in [employer] list included in D/C instructions.  Patient asking for pain medicine and was given a prescription for ibuprofen, and  encouraged to follow up with community health and wellness or other provider of his choice. Pt was stable for D/C         Danelle Berry, PA-C 03/17/15 1718  Geoffery Lyons, MD 03/18/15 1444

## 2015-03-16 NOTE — ED Notes (Signed)
Resident at bedside.  

## 2015-03-16 NOTE — Discharge Instructions (Signed)
Ankle Pain Ankle pain is a common symptom. The bones, cartilage, tendons, and muscles of the ankle joint perform a lot of work each day. The ankle joint holds your body weight and allows you to move around. Ankle pain can occur on either side or back of 1 or both ankles. Ankle pain may be sharp and burning or dull and aching. There may be tenderness, stiffness, redness, or warmth around the ankle. The pain occurs more often when a person walks or puts pressure on the ankle. CAUSES  There are many reasons ankle pain can develop. It is important to work with your caregiver to identify the cause since many conditions can impact the bones, cartilage, muscles, and tendons. Causes for ankle pain include:  Injury, including a break (fracture), sprain, or strain often due to a fall, sports, or a high-impact activity.  Swelling (inflammation) of a tendon (tendonitis).  Achilles tendon rupture.  Ankle instability after repeated sprains and strains.  Poor foot alignment.  Pressure on a nerve (tarsal tunnel syndrome).  Arthritis in the ankle or the lining of the ankle.  Crystal formation in the ankle (gout or pseudogout). DIAGNOSIS  A diagnosis is based on your medical history, your symptoms, results of your physical exam, and results of diagnostic tests. Diagnostic tests may include X-ray exams or a computerized magnetic scan (magnetic resonance imaging, MRI). TREATMENT  Treatment will depend on the cause of your ankle pain and may include:  Keeping pressure off the ankle and limiting activities.  Using crutches or other walking support (a cane or brace).  Using rest, ice, compression, and elevation.  Participating in physical therapy or home exercises.  Wearing shoe inserts or special shoes.  Losing weight.  Taking medications to reduce pain or swelling or receiving an injection.  Undergoing surgery. HOME CARE INSTRUCTIONS   Only take over-the-counter or prescription medicines for  pain, discomfort, or fever as directed by your caregiver.  Put ice on the injured area.  Put ice in a plastic bag.  Place a towel between your skin and the bag.  Leave the ice on for 15-20 minutes at a time, 03-04 times a day.  Keep your leg raised (elevated) when possible to lessen swelling.  Avoid activities that cause ankle pain.  Follow specific exercises as directed by your caregiver.  Record how often you have ankle pain, the location of the pain, and what it feels like. This information may be helpful to you and your caregiver.  Ask your caregiver about returning to work or sports and whether you should drive.  Follow up with your caregiver for further examination, therapy, or testing as directed. SEEK MEDICAL CARE IF:   Pain or swelling continues or worsens beyond 1 week.  You have an oral temperature above 102 F (38.9 C).  You are feeling unwell or have chills.  You are having an increasingly difficult time with walking.  You have loss of sensation or other new symptoms.  You have questions or concerns. MAKE SURE YOU:   Understand these instructions.  Will watch your condition.  Will get help right away if you are not doing well or get worse. Document Released: 03/28/2010 Document Revised: 12/31/2011 Document Reviewed: 03/28/2010 [facility] Patient Information 2015 [place], [place]. This information is not intended to replace advice given to you by your health care provider. Make sure you discuss any questions you have with your health care provider.    Head Injury You have a head injury. Headaches and throwing up (  vomiting) are common after a head injury. It should be easy to wake up from sleeping. Sometimes you must stay in the hospital. Most problems happen within the first 24 hours. Side effects may occur up to 7-10 days after the injury.  WHAT ARE THE TYPES OF HEAD INJURIES? Head injuries can be as [place] as a bump. Some head injuries can be more severe.  More severe head injuries include:  A jarring injury to the brain (concussion).  A bruise of the brain (contusion). This mean there is bleeding in the brain that can cause swelling. WHEN SHOULD I GET HELP RIGHT AWAY?   You are confused or sleepy.  You cannot be woken up.  You feel sick to your stomach (nauseous) or keep throwing up (vomiting).  Your dizziness or unsteadiness is getting worse.  You have very bad, lasting headaches that are not helped by medicine. Take medicines only as told by your doctor.  You cannot use your arms or legs like normal.  You cannot walk.  You notice changes in the black spots in the center of the colored part of your eye (pupil).  You have clear or bloody fluid coming from your nose or ears.  You have trouble seeing. During the next 24 hours after the injury, you must stay with someone who can watch you. This person should get help right away (call 911 in the [place] if you start to shake and are not able to control it (have seizures), you pass out, or you are unable to wake up. HOW CAN I PREVENT A HEAD INJURY IN THE FUTURE?  Wear seat belts.  Wear a helmet while bike riding and playing sports like football.  Stay away from dangerous activities around the house. WHEN CAN I RETURN TO NORMAL ACTIVITIES AND ATHLETICS? See your doctor before doing these activities. You should not do normal activities or play contact sports until 1 week after the following symptoms have stopped:  Headache that does not go away.  Dizziness.  Poor attention.  Confusion.  Memory problems.  Sickness to your stomach or throwing up.  Tiredness.  Fussiness.  Bothered by bright lights or loud noises.  Anxiousness or depression.  Restless sleep. MAKE SURE YOU:   Understand these instructions.  Will watch your condition.  Will get help right away if you are not doing well or get worse. Document Released: 09/20/2008 Document Revised: 02/22/2014 Document  Reviewed: 06/15/2013 [facility] Patient Information 2015 [place], [place]. This information is not intended to replace advice given to you by your health care provider. Make sure you discuss any questions you have with your health care provider.    Concussion A concussion, or closed-head injury, is a brain injury caused by a direct blow to the head or by a quick and sudden movement (jolt) of the head or neck. Concussions are usually not life-threatening. Even so, the effects of a concussion can be serious. If you have had a concussion before, you are more likely to experience concussion-like symptoms after a direct blow to the head.  CAUSES  Direct blow to the head, such as from running into another player during a soccer game, being hit in a fight, or hitting your head on a hard surface.  A jolt of the head or neck that causes the brain to move back and forth inside the skull, such as in a car crash. SIGNS AND SYMPTOMS The signs of a concussion can be hard to notice. Early on, they may be missed  by you, family members, and health care providers. You may look fine but act or feel differently. Symptoms are usually temporary, but they may last for days, weeks, or even longer. Some symptoms may appear right away while others may not show up for hours or days. Every head injury is different. Symptoms include:  Mild to moderate headaches that will not go away.  A feeling of pressure inside your head.  Having more trouble than usual:  Learning or remembering things you have heard.  Answering questions.  Paying attention or concentrating.  Organizing daily tasks.  Making decisions and solving problems.  Slowness in thinking, acting or reacting, speaking, or reading.  Getting lost or being easily confused.  Feeling tired all the time or lacking energy (fatigued).  Feeling drowsy.  Sleep disturbances.  Sleeping more than usual.  Sleeping less than usual.  Trouble falling  asleep.  Trouble sleeping (insomnia).  Loss of balance or feeling lightheaded or dizzy.  Nausea or vomiting.  Numbness or tingling.  Increased sensitivity to:  Sounds.  Lights.  Distractions.  Vision problems or eyes that tire easily.  Diminished sense of taste or smell.  Ringing in the ears.  Mood changes such as feeling sad or anxious.  Becoming easily irritated or angry for little or no reason.  Lack of motivation.  Seeing or hearing things other people do not see or hear (hallucinations). DIAGNOSIS Your health care provider can usually diagnose a concussion based on a description of your injury and symptoms. He or she will ask whether you passed out (lost consciousness) and whether you are having trouble remembering events that happened right before and during your injury. Your evaluation might include:  A brain scan to look for signs of injury to the brain. Even if the test shows no injury, you may still have a concussion.  Blood tests to be sure other problems are not present. TREATMENT  Concussions are usually treated in an emergency department, in urgent care, or at a clinic. You may need to stay in the hospital overnight for further treatment.  Tell your health care provider if you are taking any medicines, including prescription medicines, over-the-counter medicines, and natural remedies. Some medicines, such as blood thinners (anticoagulants) and aspirin, may increase the chance of complications. Also tell your health care provider whether you have had alcohol or are taking illegal drugs. This information may affect treatment.  Your health care provider will send you home with important instructions to follow.  How fast you will recover from a concussion depends on many factors. These factors include how severe your concussion is, what part of your brain was injured, your age, and how healthy you were before the concussion.  Most people with mild injuries  recover fully. Recovery can take time. In general, recovery is slower in older persons. Also, persons who have had a concussion in the past or have other medical problems may find that it takes longer to recover from their current injury. HOME CARE INSTRUCTIONS General Instructions  Carefully follow the directions your health care provider gave you.  Only take over-the-counter or prescription medicines for pain, discomfort, or fever as directed by your health care provider.  Take only those medicines that your health care provider has approved.  Do not drink alcohol until your health care provider says you are well enough to do so. Alcohol and certain other drugs may slow your recovery and can put you at risk of further injury.  If it is harder  than usual to remember things, write them down.  If you are easily distracted, try to do one thing at a time. For example, do not try to watch TV while fixing dinner.  Talk with family members or close friends when making important decisions.  Keep all follow-up appointments. Repeated evaluation of your symptoms is recommended for your recovery.  Watch your symptoms and tell others to do the same. Complications sometimes occur after a concussion. Older adults with a brain injury may have a higher risk of serious complications, such as a blood clot on the brain.  Tell your teachers, school nurse, school counselor, coach, athletic trainer, or work Production designer, theatre/television/film about your injury, symptoms, and restrictions. Tell them about what you can or cannot do. They should watch for:  Increased problems with attention or concentration.  Increased difficulty remembering or learning new information.  Increased time needed to complete tasks or assignments.  Increased irritability or decreased ability to cope with stress.  Increased symptoms.  Rest. Rest helps the brain to heal. Make sure you:  Get plenty of sleep at night. Avoid staying up late at night.  Keep  the same bedtime hours on weekends and weekdays.  Rest during the day. Take daytime naps or rest breaks when you feel tired.  Limit activities that require a lot of thought or concentration. These include:  Doing homework or job-related work.  Watching TV.  Working on the computer.  Avoid any situation where there is potential for another head injury (football, hockey, soccer, basketball, martial arts, downhill snow sports and horseback riding). Your condition will get worse every time you experience a concussion. You should avoid these activities until you are evaluated by the appropriate follow-up health care providers. Returning To Your Regular Activities You will need to return to your normal activities slowly, not all at once. You must give your body and brain enough time for recovery.  Do not return to sports or other athletic activities until your health care provider tells you it is safe to do so.  Ask your health care provider when you can drive, ride a bicycle, or operate heavy machinery. Your ability to react may be slower after a brain injury. Never do these activities if you are dizzy.  Ask your health care provider about when you can return to work or school. Preventing Another Concussion It is very important to avoid another brain injury, especially before you have recovered. In rare cases, another injury can lead to permanent brain damage, brain swelling, or death. The risk of this is greatest during the first 7-10 days after a head injury. Avoid injuries by:  Wearing a seat belt when riding in a car.  Drinking alcohol only in moderation.  Wearing a helmet when biking, skiing, skateboarding, skating, or doing similar activities.  Avoiding activities that could lead to a second concussion, such as contact or recreational sports, until your health care provider says it is okay.  Taking safety measures in your home.  Remove clutter and tripping hazards from floors and  stairways.  Use grab bars in bathrooms and handrails by stairs.  Place non-slip mats on floors and in bathtubs.  Improve lighting in dim areas. SEEK MEDICAL CARE IF:  You have increased problems paying attention or concentrating.  You have increased difficulty remembering or learning new information.  You need more time to complete tasks or assignments than before.  You have increased irritability or decreased ability to cope with stress.  You have more symptoms  than before. Seek medical care if you have any of the following symptoms for more than 2 weeks after your injury:  Lasting (chronic) headaches.  Dizziness or balance problems.  Nausea.  Vision problems.  Increased sensitivity to noise or light.  Depression or mood swings.  Anxiety or irritability.  Memory problems.  Difficulty concentrating or paying attention.  Sleep problems.  Feeling tired all the time. SEEK IMMEDIATE MEDICAL CARE IF:  You have severe or worsening headaches. These may be a sign of a blood clot in the brain.  You have weakness (even if only in one hand, leg, or part of the face).  You have numbness.  You have decreased coordination.  You vomit repeatedly.  You have increased sleepiness.  One pupil is larger than the other.  You have convulsions.  You have slurred speech.  You have increased confusion. This may be a sign of a blood clot in the brain.  You have increased restlessness, agitation, or irritability.  You are unable to recognize people or places.  You have neck pain.  It is difficult to wake you up.  You have unusual behavior changes.  You lose consciousness. MAKE SURE YOU:  Understand these instructions.  Will watch your condition.  Will get help right away if you are not doing well or get worse. Document Released: 12/29/2003 Document Revised: 10/13/2013 Document Reviewed: 04/30/2013 [facility] Patient Information 2015 [place], [place]. This  information is not intended to replace advice given to you by your health care provider. Make sure you discuss any questions you have with your health care provider.   Emergency Department Resource Guide 1) Find a Doctor and Pay Out of Pocket Although you won't have to find out who is covered by your insurance plan, it is a good idea to ask around and get recommendations. You will then need to call the office and see if the doctor you have chosen will accept you as a new patient and what types of options they offer for patients who are self-pay. Some doctors offer discounts or will set up payment plans for their patients who do not have insurance, but you will need to ask so you aren't surprised when you get to your appointment.  2) Contact Your Local Health Department Not all health departments have doctors that can see patients for sick visits, but many do, so it is worth a call to see if yours does. If you don't know where your local health department is, you can check in your phone book. The CDC also has a tool to help you locate your state's health department, and many state websites also have listings of all of their local health departments.  3) Find a Walk-in Clinic If your illness is not likely to be very severe or complicated, you may want to try a walk in clinic. These are popping up all over the country in pharmacies, drugstores, and shopping centers. They're usually staffed by nurse practitioners or physician assistants that have been trained to treat common illnesses and complaints. They're usually fairly quick and inexpensive. However, if you have serious medical issues or chronic medical problems, these are probably not your best option.  No Primary Care Doctor: - Call Health Connect at  [phone] - they can help you locate a primary care doctor that  accepts your insurance, provides certain services, etc. - Physician Referral Service- [phone]  Chronic Pain  Problems: Organization         Address  Phone  Notes  [facility]  [phone] Patients need to be referred by their primary care doctor.   Medication Assistance: Organization         Address  Phone   Notes  [facility] Medication [facility] [address] [place] [place], [place] [address] [phone] --Must be a resident of [facility] -- Must have NO insurance coverage whatsoever (no Medicaid/ Medicare, etc.) -- The pt. MUST have a primary care doctor that directs their care regularly and follows them in the community   [facility]  [phone]   [employer]  [phone]    Agencies that provide inexpensive medical care: Organization         Address  Phone   Notes  [facility]  [phone]   [facility]    [phone]   [facility] [address] [place], [place] [address] [phone]   [facility] [address], [place] [phone]   [facility]anned [facility]    [phone]   [facility]    [phone]   [facility]  [address], [place] Phone:  [phone], Fax:  [phone] Hours of Operation:  9 am - 6 pm, M-F.  Also accepts Medicaid/Medicare and self-pay.  [facility]  [address], [place] Phone: [phone], Fax: [phone]. Hours of Operation:  8:30 am - 5:30 pm, M-F.  Also accepts Medicaid and self-pay.  [facility] [place] [address] [place] Phone: [phone]   [facility] [address] Natasha Bence [place], [place] [phone], Ext. [phone] Mondays & Thursdays: 7-9 AM.  First 15 patients are seen on a first come, first serve basis.    Medicaid-accepting [facility] Providers:  Organization         Address  Phone   Notes  [facility] [address], [place] [phone] Also  accepts self-pay patients.  [facility] [address] Laurell Josephs [place], [place]  [phone]   [facility] [address], [place] [phone]   [facility] [address] [place] [phone]   Renaye Rakers [address], [place]   [phone] Only accepts [place] patients after they have their name applied to their card.   Self-Pay (no insurance) in [facility]:  Organization         Address  Phone   Notes  Sickle Cell Patients, [facility] [address] [place], [place] [phone]   [facility] [address] [place], [place] [phone]   [facility] [place]  [address], [place] [phone]   [facility]/Dr. Osei-Bonsu  [address], [employer] or [address], [place] [phone] Phone number for both [place] and [place] locations is the same.  [facility] [address], [place] [phone]   [facility] [address], [place] or [address] [phone]   [facility] [address], [place] [phone], phone; [phone], fax Sees patients 1st and 3rd Saturday of every month.  Must not qualify for public or private insurance (i.e.  Medicaid, Medicare, [place] Health Choice, Veterans' Benefits)  Household income should be no more than 200% of the poverty level The clinic cannot treat you if you are pregnant or think you are pregnant  Sexually transmitted diseases are not treated at the clinic.    Dental Care: Organization         Address  Phone  Notes  [facility] [address] [place], [place] [phone] Accepts children up to age 60 who are enrolled in [place] or [place] Health Choice; pregnant  women with a Medicaid card; and children who have applied for Medicaid or [place] Health Choice, but were declined, whose parents can pay a reduced fee at time of service.  [facility] of [facility]  [address], [place] [phone] Accepts children up to age 77 who are enrolled in [place] or [place] Health Choice; pregnant women with a Medicaid card; and children who have applied for Medicaid or [place] Health Choice, but were declined, whose parents can pay a reduced fee at time of service.  [facility]  [address] [place], [place] [phone] Patients are seen by appointment only. Walk-ins are not accepted. [facility] will see patients 70 years of age and older. Monday - Tuesday (8am-5pm) Most Wednesdays (8:30-5pm) $30 per visit, cash only  [facility]  [address], [facility] [phone] Patients are seen by appointment only. Walk-ins are not accepted. [facility] will see patients 65 years of age and older. One Wednesday Evening (Monthly: Volunteer Based).  $30 per visit, cash only  [employer]  [phone] for adults; Children under age 34, call Graduate Pediatric Dentistry at [phone]. Children aged 81-14, please call [phone] to request a pediatric application.  Dental services are provided in all areas of dental care including fillings, crowns and bridges, complete and partial dentures, implants, gum treatment, root canals, and extractions. Preventive care is also provided. Treatment is provided to both adults and children. Patients are selected via a lottery and there is often a waiting list.   [facility] [address] [place]  [phone] [email or web address]   [facility] [address] [place], [place] [phone], Ext. [phone] Second and Fourth Thursday of each month, opens at 6:30 AM; Clinic ends at 9 AM.  Patients are  seen on a first-come first-served basis, and a limited number are seen during each clinic.   [facility]  [address] Ether Griffins [place], [place] [phone]   Eligibility Requirements You must have lived in [place], [place], or [place] counties for at least the last three months.   You cannot be eligible for state or federal sponsored [place], including [employer], [place], or [employer].   You generally cannot be eligible for healthcare insurance through your employer.    How to apply: Eligibility screenings are held every Tuesday and Wednesday afternoon from 1:00 pm until 4:00 pm. You do not need an appointment for the interview!  [facility] [address], [place], [place] [phone]   [facility]  [phone]   [facility] Health Department  [phone]   [facility]  [phone]    Behavioral Health Resources in the Community: Intensive Outpatient Programs Organization         Address  Phone  Notes  [facility] [address], [place], [place] [phone]   [facility] [address] [place], [place] [phone]   [facility] [address] [place], [place]  [phone]   [facility] [address]  [place], [place] [phone] or [phone]   Substance Abuse Resources Organization         Address  Phone  Notes  [facility]  [phone]   [facility]  [phone]   [facility] [place]  [phone]   Floydene Flock  [phone]   Residential & Outpatient Substance Abuse Program  [phone]   Psychological Services Organization         Address  Phone  Notes  [facility]  [phone]   [facility]  [phone]   [facility] [address], [place] [phone] or [phone]    Mobile Crisis  Teams Organization         Address  Phone  Notes  [facility], Mobile Crisis Care Unit  [phone]   [facility]  [address] [place], [place] [phone]   Doristine Locks [address] [place] [phone]    Self-Help/Support Groups Organization         Address  Phone             Notes  [facility] [place] - variety of support groups  [phone]- [phone] Call for more information  Narcotics Anonymous (NA), [facility] [address], [employer] [place]  2 meetings at this location   Statistician         Address  Phone  Notes  [facility] [address],    [place]  [phone]   [facility]  [address] [place] [address], [place], [place] [phone]   [facility] [address] [place], [place] [phone] Admissions: 8am-3pm M-F  [facility] [address],    [place], [place] [phone]   [facility] [address] [place], [place], [place] [phone]   [facility] [address],  [place], [place] [phone]   [facility] Intensive Outpatient [address], [place], [place] [phone]   [facility] [address] [place]  [place], [place] [phone] or [phone]   [facility] ([facility] [address], [place], [place] [phone] Accepts Medicaid  [facility] [address],  [place] [phone] Substance Abuse/Addiction Treatment   [facility] Organization         Address  Phone  Notes  [facility]  [phone]   Angie Fava, PhD [address] [place], [place]   [phone] or [phone]   [facility] Behavioral   [address] [place], [place] [phone]   [facility] [address] [place], [place] [phone]  Insurance/Medicaid/sponsorship through [facility] and Families [address]                                    [place], [place] [phone] Therapy/tele-psych/case  [facility] [address], [place] [phone]    Dr. Lolly Mustache  [phone]   [facility]  [employer] [facility] Dept. 1)  [address],  2) [place] 3)  [place] [address], [place] [phone]  [phone]   [place] [phone] or [phone] (After Hours)

## 2015-03-16 NOTE — ED Notes (Addendum)
Per EMS- Pt here for multiple complaints. Pt first stated he stepped into a hole and twisted his ankle, then fell going into work and hit the back of his head. No visible signs of injury noted. Right ankle soreness. Then patient reported he felt dizzy so he took a nitro. Pt also reports numbness to the left side of his face. Pt reported chest pain after fall as well, but denies chest pain now. Pupils equal and reactive. Also reports left arm pain. Pt took a nitro as well prior to calling EMS

## 2015-09-12 ENCOUNTER — Emergency Department (HOSPITAL_COMMUNITY)
Admission: EM | Admit: 2015-09-12 | Discharge: 2015-09-12 | Disposition: A | Discharge status: Home / Self Care | Payer: Medicaid Other | Attending: Emergency Medicine | Admitting: Emergency Medicine

## 2015-09-12 ENCOUNTER — Emergency Department (HOSPITAL_COMMUNITY): Payer: Medicaid Other

## 2015-09-12 ENCOUNTER — Encounter (HOSPITAL_COMMUNITY): Payer: Self-pay

## 2015-09-12 DIAGNOSIS — Z8673 Personal history of transient ischemic attack (TIA), and cerebral infarction without residual deficits: Secondary | ICD-10-CM | POA: Diagnosis not present

## 2015-09-12 DIAGNOSIS — Z8659 Personal history of other mental and behavioral disorders: Secondary | ICD-10-CM | POA: Insufficient documentation

## 2015-09-12 DIAGNOSIS — M542 Cervicalgia: Secondary | ICD-10-CM | POA: Diagnosis present

## 2015-09-12 DIAGNOSIS — M79602 Pain in left arm: Secondary | ICD-10-CM | POA: Insufficient documentation

## 2015-09-12 DIAGNOSIS — M1990 Unspecified osteoarthritis, unspecified site: Secondary | ICD-10-CM | POA: Diagnosis not present

## 2015-09-12 DIAGNOSIS — H538 Other visual disturbances: Secondary | ICD-10-CM | POA: Diagnosis not present

## 2015-09-12 DIAGNOSIS — Z8701 Personal history of pneumonia (recurrent): Secondary | ICD-10-CM | POA: Diagnosis not present

## 2015-09-12 DIAGNOSIS — E119 Type 2 diabetes mellitus without complications: Secondary | ICD-10-CM | POA: Diagnosis not present

## 2015-09-12 DIAGNOSIS — I509 Heart failure, unspecified: Secondary | ICD-10-CM | POA: Insufficient documentation

## 2015-09-12 DIAGNOSIS — I252 Old myocardial infarction: Secondary | ICD-10-CM | POA: Insufficient documentation

## 2015-09-12 DIAGNOSIS — Z791 Long term (current) use of non-steroidal anti-inflammatories (NSAID): Secondary | ICD-10-CM | POA: Diagnosis not present

## 2015-09-12 DIAGNOSIS — I10 Essential (primary) hypertension: Secondary | ICD-10-CM | POA: Diagnosis not present

## 2015-09-12 DIAGNOSIS — Z87448 Personal history of other diseases of urinary system: Secondary | ICD-10-CM | POA: Diagnosis not present

## 2015-09-12 DIAGNOSIS — Z7982 Long term (current) use of aspirin: Secondary | ICD-10-CM | POA: Diagnosis not present

## 2015-09-12 DIAGNOSIS — E7800 Pure hypercholesterolemia, unspecified: Secondary | ICD-10-CM | POA: Diagnosis not present

## 2015-09-12 DIAGNOSIS — G43909 Migraine, unspecified, not intractable, without status migrainosus: Secondary | ICD-10-CM | POA: Insufficient documentation

## 2015-09-12 DIAGNOSIS — Z9889 Other specified postprocedural states: Secondary | ICD-10-CM | POA: Insufficient documentation

## 2015-09-12 DIAGNOSIS — Z79899 Other long term (current) drug therapy: Secondary | ICD-10-CM | POA: Diagnosis not present

## 2015-09-12 DIAGNOSIS — M109 Gout, unspecified: Secondary | ICD-10-CM | POA: Insufficient documentation

## 2015-09-12 HISTORY — DX: Disorder of kidney and ureter, unspecified: N28.9

## 2015-09-12 HISTORY — DX: Type 2 diabetes mellitus without complications: E11.9

## 2015-09-12 LAB — CBC WITH DIFFERENTIAL/PLATELET
BASOS PCT: 0 %
Basophils Absolute: 0 10*3/uL (ref 0.0–0.1)
EOS ABS: 0 10*3/uL (ref 0.0–0.7)
EOS PCT: 1 %
HCT: 47.3 % (ref 39.0–52.0)
HEMOGLOBIN: 16.1 g/dL (ref 13.0–17.0)
Lymphocytes Relative: 32 %
Lymphs Abs: 1.4 10*3/uL (ref 0.7–4.0)
MCH: 31.1 pg (ref 26.0–34.0)
MCHC: 34 g/dL (ref 30.0–36.0)
MCV: 91.3 fL (ref 78.0–100.0)
MONO ABS: 0.2 10*3/uL (ref 0.1–1.0)
MONOS PCT: 5 %
NEUTROS PCT: 62 %
Neutro Abs: 2.8 10*3/uL (ref 1.7–7.7)
PLATELETS: 198 10*3/uL (ref 150–400)
RBC: 5.18 MIL/uL (ref 4.22–5.81)
RDW: 13.4 % (ref 11.5–15.5)
WBC: 4.5 10*3/uL (ref 4.0–10.5)

## 2015-09-12 LAB — PROTIME-INR
INR: 1.14 (ref 0.00–1.49)
PROTHROMBIN TIME: 14.8 s (ref 11.6–15.2)

## 2015-09-12 LAB — COMPREHENSIVE METABOLIC PANEL
ALBUMIN: 3.6 g/dL (ref 3.5–5.0)
ALT: 24 U/L (ref 17–63)
ANION GAP: 7 (ref 5–15)
AST: 21 U/L (ref 15–41)
Alkaline Phosphatase: 40 U/L (ref 38–126)
BUN: 15 mg/dL (ref 6–20)
CHLORIDE: 109 mmol/L (ref 101–111)
CO2: 24 mmol/L (ref 22–32)
Calcium: 9.2 mg/dL (ref 8.9–10.3)
Creatinine, Ser: 1.38 mg/dL — ABNORMAL HIGH (ref 0.61–1.24)
GFR calc non Af Amer: 54 mL/min — ABNORMAL LOW (ref >60)
GLUCOSE: 102 mg/dL — AB (ref 65–99)
POTASSIUM: 4.2 mmol/L (ref 3.5–5.1)
SODIUM: 140 mmol/L (ref 135–145)
Total Bilirubin: 0.8 mg/dL (ref 0.3–1.2)
Total Protein: 6.8 g/dL (ref 6.5–8.1)

## 2015-09-12 LAB — I-STAT TROPONIN, ED: Troponin i, poc: 0 ng/mL (ref 0.00–0.08)

## 2015-09-12 LAB — TROPONIN I: Troponin I: <0.03 ng/mL (ref <0.031)

## 2015-09-12 MED ORDER — DIAZEPAM 5 MG PO TABS: 5.0000 mg | ORAL_TABLET | Freq: Four times a day (QID) | ORAL | Status: DC | PRN

## 2015-09-12 MED ORDER — DIAZEPAM 5 MG/ML IJ SOLN
5.0000 mg | Freq: Once | INTRAMUSCULAR | Status: AC
  Administered 2015-09-12: 5 mg via INTRAVENOUS
  Filled 2015-09-12: qty 2

## 2015-09-12 MED ORDER — DEXAMETHASONE SODIUM PHOSPHATE 10 MG/ML IJ SOLN
10.0000 mg | Freq: Once | INTRAMUSCULAR | Status: AC
  Administered 2015-09-12: 10 mg via INTRAVENOUS
  Filled 2015-09-12: qty 1

## 2015-09-12 MED ORDER — IOHEXOL 350 MG/ML SOLN
80.0000 mL | Freq: Once | INTRAVENOUS | Status: AC | PRN
  Administered 2015-09-12: 80 mL via INTRAVENOUS

## 2015-09-12 MED ORDER — SODIUM CHLORIDE 0.9 % IV BOLUS (SEPSIS)
1000.0000 mL | Freq: Once | INTRAVENOUS | Status: AC
  Administered 2015-09-12: 1000 mL via INTRAVENOUS

## 2015-09-12 MED ORDER — MORPHINE SULFATE (PF) 4 MG/ML IV SOLN
4.0000 mg | Freq: Once | INTRAVENOUS | Status: AC
  Administered 2015-09-12: 4 mg via INTRAVENOUS
  Filled 2015-09-12: qty 1

## 2015-09-12 MED ORDER — HYDROCHLOROTHIAZIDE 25 MG PO TABS
25.0000 mg | ORAL_TABLET | Freq: Once | ORAL | Status: AC
  Administered 2015-09-12: 25 mg via ORAL
  Filled 2015-09-12: qty 1

## 2015-09-12 MED ORDER — LOSARTAN POTASSIUM 50 MG PO TABS
100.0000 mg | ORAL_TABLET | Freq: Once | ORAL | Status: AC
Start: 2015-09-12 — End: 2015-09-12
  Administered 2015-09-12: 100 mg via ORAL
  Filled 2015-09-12: qty 2

## 2015-09-12 NOTE — Discharge Instructions (Signed)
Your neck pain and arm pain may be secondary to a pinched nerve in your neck but that is not for sure.  You need to follow up with your primary care physician for this issue.  You may need physical therapy for this in the future.  You also need to have your kidney function rechecked.  Drinking lots of water to keep yourself well hydrated.  Cervical Radiculopathy Cervical radiculopathy happens when a nerve in the neck (cervical nerve) is pinched or bruised. This condition can develop because of an injury or as part of the normal aging process. Pressure on the cervical nerves can cause pain or numbness that runs from the neck all the way down into the arm and fingers. Usually, this condition gets better with rest. Treatment may be needed if the condition does not improve.  CAUSES This condition may be caused by:  Injury.  Slipped (herniated) disk.  Muscle tightness in the neck because of overuse.  Arthritis.  Breakdown or degeneration in the bones and joints of the spine (spondylosis) due to aging.  Bone spurs that may develop near the cervical nerves. SYMPTOMS Symptoms of this condition include:  Pain that runs from the neck to the arm and hand. The pain can be severe or irritating. It may be worse when the neck is moved.  Numbness or weakness in the affected arm and hand. DIAGNOSIS This condition may be diagnosed based on symptoms, medical history, and a physical exam. You may also have tests, including:  X-rays.  CT scan.  MRI.  Electromyogram (EMG).  Nerve conduction tests. TREATMENT In many cases, treatment is not needed for this condition. With rest, the condition usually gets better over time. If treatment is needed, options may include:  Wearing a soft neck collar for short periods of time.  Physical therapy to strengthen your neck muscles.  Medicines, such as NSAIDs, oral corticosteroids, or spinal injections.  Surgery. This may be needed if other treatments do not  help. Various types of surgery may be done depending on the cause of your problems. HOME CARE INSTRUCTIONS Managing Pain  Take over-the-counter and prescription medicines only as told by your health care provider.  If directed, apply ice to the affected area.  Put ice in a plastic bag.  Place a towel between your skin and the bag.  Leave the ice on for 20 minutes, 2-3 times per day.  If ice does not help, you can try using heat. Take a warm shower or warm bath, or use a heat pack as told by your health care provider.  Try a gentle neck and shoulder massage to help relieve symptoms. Activity  Rest as needed. Follow instructions from your health care provider about any restrictions on activities.  Do stretching and strengthening exercises as told by your health care provider or physical therapist. General Instructions  If you were given a soft collar, wear it as told by your health care provider.  Use a flat pillow when you sleep.  Keep all follow-up visits as told by your health care provider. This is important. SEEK MEDICAL CARE IF:  Your condition does not improve with treatment. SEEK IMMEDIATE MEDICAL CARE IF:  Your pain gets much worse and cannot be controlled with medicines.  You have weakness or numbness in your hand, arm, face, or leg.  You have a high fever.  You have a stiff, rigid neck.  You lose control of your bowels or your bladder (have incontinence).  You have trouble  with walking, balance, or speaking.   This information is not intended to replace advice given to you by your health care provider. Make sure you discuss any questions you have with your health care provider.   Document Released: 07/03/2001 Document Revised: 06/29/2015 Document Reviewed: 12/02/2014 Elsevier Interactive Patient Education [employer]

## 2015-09-12 NOTE — ED Notes (Signed)
Patient returned from CT

## 2015-09-12 NOTE — ED Notes (Signed)
Patient transported to CT 

## 2015-09-12 NOTE — ED Notes (Signed)
Patient here with head pain radiating down lewft side of neck and down left arm. Reports severe pain with any movement of head. Denies trauma. Reports that he awoke with the pain

## 2015-09-12 NOTE — ED Provider Notes (Signed)
CSN: [id number]     Arrival date & time 09/12/15  0915 History   First MD Initiated Contact with Patient 09/12/15 [id number]     Chief Complaint  Patient presents with  . Neck Pain     (Consider location/radiation/quality/duration/timing/severity/associated sxs/prior Treatment) HPI Comments: 59 y.o. Male with history of HTN, CVA, hypercholesterolemia presents for left arm pain.  The patient states that he was getting ready to make himself breakfast when he suddenly developed sharp pain in the left neck and left arm with associated numbness and tingling in the left side of his face and the left arm.  Denies injury or trauma.  Denies having symptoms like this before.  Said he felt fine when he woke up.  Patient denies chest pain, shortness of breath.  No back pain.  Reports that the vision in his left eye feels a little blurry but denies vision loss.   Past Medical History  Diagnosis Date  . Hyperglycemia   . Gout, unspecified   . Hypercholesterolemia   . HTN (hypertension)   . Blood in stool   . Migraines   . Stroke (HCC)   . Arthritis   . Pneumonia   . Depression   . Myocardial infarction (HCC)     drug cocaine and marijuana at time of mi none  since    . Cocaine abuse     history  . Normal cardiac stress test 07/2013  . Headache   . CHF (congestive heart failure) (HCC)   . Diabetes mellitus without complication (HCC)   . Renal insufficiency    Past Surgical History  Procedure Laterality Date  . Reapir of left inguinal hernia  02/02/2003    Dr. Violeta Gelinas. repair with mesh. Same procedure done again in  05/22/2004   . Rotator cuff repair  2010    left   . Cardiac catheterization  2008    [place]-40%lad  . Reconstruct / stabilize distal ulna    . Inguinal hernia repair  05/12/2012    Procedure: HERNIA REPAIR INGUINAL ADULT;  Surgeon: Liz Malady, MD;  Location: [facility];  Service: General;  Laterality: Right;  . Hernia repair     Family History   Problem Relation Age of Onset  . Heart attack Mother     x7. Still alive  . Heart attack Father     deceased b/c of MI  . Heart attack Brother     older, deceased b/c of MI    Social History  Substance Use Topics  . Smoking status: Never Smoker   . Smokeless tobacco: Never Used  . Alcohol Use: 4.2 oz/week    7 Cans of beer per week     Comment: occasional beer    Review of Systems  Constitutional: Negative for diaphoresis, appetite change and fatigue.  HENT: Negative for congestion, postnasal drip and rhinorrhea.   Eyes: Positive for visual disturbance. Negative for pain and redness.  Respiratory: Negative for cough, chest tightness and shortness of breath.   Cardiovascular: Negative for chest pain and palpitations.  Gastrointestinal: Negative for nausea, vomiting, abdominal pain and diarrhea.  Genitourinary: Negative for dysuria, urgency and hematuria.  Musculoskeletal: Positive for neck pain. Negative for myalgias and back pain.  Skin: Negative for rash.  Neurological: Positive for numbness (left arm and face) and headaches. Negative for syncope, speech difficulty and weakness.  Hematological: Does not bruise/bleed easily.      Allergies  Shellfish allergy  Home Medications   Prior to  Admission medications   Medication Sig Start Date End Date Taking? Authorizing Provider  allopurinol (ZYLOPRIM) 300 MG tablet Take 1 tablet (300 mg total) by mouth daily. 11/16/14  Yes Wendall Stade, MD  aspirin EC 325 MG tablet Take 325 mg by mouth daily.     Yes Historical Provider, MD  CALCIUM-VITAMIN D PO Take 1 tablet by mouth daily.   Yes Historical Provider, MD  hydrochlorothiazide (HYDRODIURIL) 25 MG tablet Take 1 tablet (25 mg total) by mouth daily. 11/16/14  Yes Wendall Stade, MD  indomethacin (INDOCIN) 25 MG capsule Take 1 capsule (25 mg total) by mouth 3 (three) times daily with meals. 11/26/13  Yes Wendall Stade, MD  Iron-Vitamins (GERITOL PO) Take 2 tablets by mouth daily.    Yes Historical Provider, MD  losartan (COZAAR) 100 MG tablet Take 1 tablet (100 mg total) by mouth daily. 12/03/14  Yes Wendall Stade, MD  meclizine (ANTIVERT) 25 MG tablet Take 25 mg by mouth 3 (three) times daily as needed. For vertigo   Yes Historical Provider, MD  pravastatin (PRAVACHOL) 40 MG tablet Take 1 tablet (40 mg total) by mouth daily. 11/16/14  Yes Wendall Stade, MD  cyclobenzaprine (FLEXERIL) 10 MG tablet Take 1 tablet (10 mg total) by mouth 3 (three) times daily as needed. For muscle spasms 11/16/14   Wendall Stade, MD  diazepam (VALIUM) 5 MG tablet Take 1 tablet (5 mg total) by mouth every 6 (six) hours as needed for muscle spasms. 09/12/15   Leta Baptist, MD  ibuprofen (ADVIL,MOTRIN) 800 MG tablet Take 1 tablet (800 mg total) by mouth 3 (three) times daily. Patient taking differently: Take 800 mg by mouth every 8 (eight) hours as needed for moderate pain.  03/16/15   Danelle Berry, PA-C  nitroGLYCERIN (NITROSTAT) 0.4 MG SL tablet Place 1 tablet (0.4 mg total) under the tongue every 5 (five) minutes as needed for chest pain. 11/16/14   Wendall Stade, MD   BP 145/87 mmHg  Pulse 68  Temp(Src) 98.1 F (36.7 C) (Oral)  Resp 18  Ht 5\' 11"  (1.803 m)  Wt 192 lb (87.091 kg)  BMI 26.79 kg/m2  SpO2 99% Physical Exam  Constitutional: He is oriented to person, place, and time. He appears well-developed and well-nourished. No distress.  HENT:  Head: Normocephalic and atraumatic.  Right Ear: External ear normal.  Left Ear: External ear normal.  Mouth/Throat: Oropharynx is clear and moist. No oropharyngeal exudate.  Eyes: EOM are normal. Pupils are equal, round, and reactive to light.  Neck: Neck supple. No spinous process tenderness and no muscular tenderness present. No rigidity. Decreased range of motion (decreased ability to look over right shoulder secondary to pain) present.  Cardiovascular: Normal rate, regular rhythm, normal heart sounds and intact distal pulses.   No murmur  heard. Pulmonary/Chest: Effort normal. No respiratory distress. He has no wheezes. He has no rales.  Abdominal: Soft. He exhibits no distension. There is no tenderness.  Musculoskeletal: He exhibits no edema.       Left shoulder: Normal.       Left elbow: Normal.       Left wrist: Normal.       Left hand: Normal. Normal sensation noted. Normal strength noted.  Neurological: He is alert and oriented to person, place, and time. He has normal strength. No cranial nerve deficit or sensory deficit. Coordination normal.  Normal finger to nose on examination.    Skin: Skin is warm and  dry. No rash noted. He is not diaphoretic.  Vitals reviewed.   ED Course  Procedures (including critical care time) Labs Review Labs Reviewed  COMPREHENSIVE METABOLIC PANEL - Abnormal; Notable for the following:    Glucose, Bld 102 (*)    Creatinine, Ser 1.38 (*)    GFR calc non Af Amer 54 (*)    All other components within normal limits  CBC WITH DIFFERENTIAL/PLATELET  PROTIME-INR  TROPONIN I  I-STAT TROPOININ, ED  I-STAT TROPOININ, ED    Imaging Review Ct Angio Head W/cm &/or Wo Cm  09/12/2015  CLINICAL DATA:  Head pain radiating down the left side of the face, neck, and arm. Concern for arterial dissection. EXAM: CT ANGIOGRAPHY HEAD AND NECK TECHNIQUE: Multidetector CT imaging of the head and neck was performed using the standard protocol during bolus administration of intravenous contrast. Multiplanar CT image reconstructions and MIPs were obtained to evaluate the vascular anatomy. Carotid stenosis measurements (when applicable) are obtained utilizing NASCET criteria, using the distal internal carotid diameter as the denominator. CONTRAST:  74mL OMNIPAQUE IOHEXOL 350 MG/ML SOLN COMPARISON:  Head and cervical spine CT 03/16/2015. Head MRI/MRA 01/21/2011. FINDINGS: CTA NECK Aortic arch: Normal variant aortic arch branching is noted with a common origin of the brachiocephalic and left common carotid arteries.  Brachiocephalic and subclavian arteries are widely patent. Right carotid system: Patent without evidence of stenosis, dissection, or significant atherosclerosis. Left carotid system: Patent without evidence of dissection. Mild calcified and noncalcified plaque at the ICA origin without stenosis. Vertebral arteries: Patent without evidence of dissection or significant stenosis. Left vertebral artery is dominant. Skeleton: Advanced disc degeneration from C4-5 to C7-T1 and advanced left-sided facet arthrosis at C3-4. There is bilateral neural foraminal stenosis from C3-4 to C6-7, up to at least moderate in degree. Other neck: No neck mass. CTA HEAD Anterior circulation: Internal carotid arteries are patent from skullbase to carotid termini without significant stenosis. ACAs and MCAs are patent with mild-to-moderate branch vessel irregularity and attenuation bilaterally but no significant proximal stenosis. No intracranial aneurysm is identified. Posterior circulation: Intracranial vertebral arteries are patent with the left being dominant. The right vertebral artery is particularly hypoplastic distal to the PICA origin. PICA and SCA origins are patent bilaterally. Basilar artery is patent and slightly small in caliber diffusely without a focal stenosis. There is likely a small posterior communicating artery on the left. PCAs are patent with mild to moderate branch vessel irregularity and attenuation bilaterally but no significant proximal stenosis. Venous sinuses: Patent. Anatomic variants: None of significance. Delayed phase: No abnormal enhancement. IMPRESSION: 1. Patent major arterial vasculature of the neck without evidence of dissection or stenosis. 2. No evidence of major intracranial arterial occlusion or significant proximal stenosis. Mild-to-moderate branch vessel irregularity and attenuation which may reflect atherosclerosis. 3. Advanced cervical disc degeneration with multilevel neural foraminal stenosis.  Electronically Signed   By: Sebastian Ache M.D.   On: 09/12/2015 12:11   Dg Chest 2 View  09/12/2015  CLINICAL DATA:  Left arm pain, neck pain, dizziness and headache. EXAM: CHEST  2 VIEW COMPARISON:  02/04/2015 FINDINGS: The heart size and mediastinal contours are within normal limits. Both lungs are clear. The visualized skeletal structures are unremarkable. IMPRESSION: No active cardiopulmonary disease. Electronically Signed   By: Charlett Nose M.D.   On: 09/12/2015 10:52   Ct Head Wo Contrast  09/12/2015  CLINICAL DATA:  Head pain radiating down the left side of the neck and arm. EXAM: CT HEAD WITHOUT CONTRAST TECHNIQUE:  Contiguous axial images were obtained from the base of the skull through the vertex without intravenous contrast. COMPARISON:  03/16/2015 FINDINGS: There is no evidence of acute cortical infarct, intracranial hemorrhage, mass, midline shift, or extra-axial fluid collection. Ventricles and sulci are normal. Minimal periventricular white matter hypodensity, most conspicuous near the left frontal horn, is unchanged and nonspecific but may reflect minimal chronic small vessel ischemic disease. Orbits are unremarkable. The visualized paranasal sinuses and mastoid air cells are clear. No skull fracture or destructive osseous lesion is seen. IMPRESSION: No evidence of acute intracranial abnormality. Electronically Signed   By: Sebastian Ache M.D.   On: 09/12/2015 12:13   Ct Angio Neck W/cm &/or Wo/cm  09/12/2015  CLINICAL DATA:  Head pain radiating down the left side of the face, neck, and arm. Concern for arterial dissection. EXAM: CT ANGIOGRAPHY HEAD AND NECK TECHNIQUE: Multidetector CT imaging of the head and neck was performed using the standard protocol during bolus administration of intravenous contrast. Multiplanar CT image reconstructions and MIPs were obtained to evaluate the vascular anatomy. Carotid stenosis measurements (when applicable) are obtained utilizing NASCET criteria, using  the distal internal carotid diameter as the denominator. CONTRAST:  80mL OMNIPAQUE IOHEXOL 350 MG/ML SOLN COMPARISON:  Head and cervical spine CT 03/16/2015. Head MRI/MRA 01/21/2011. FINDINGS: CTA NECK Aortic arch: Normal variant aortic arch branching is noted with a common origin of the brachiocephalic and left common carotid arteries. Brachiocephalic and subclavian arteries are widely patent. Right carotid system: Patent without evidence of stenosis, dissection, or significant atherosclerosis. Left carotid system: Patent without evidence of dissection. Mild calcified and noncalcified plaque at the ICA origin without stenosis. Vertebral arteries: Patent without evidence of dissection or significant stenosis. Left vertebral artery is dominant. Skeleton: Advanced disc degeneration from C4-5 to C7-T1 and advanced left-sided facet arthrosis at C3-4. There is bilateral neural foraminal stenosis from C3-4 to C6-7, up to at least moderate in degree. Other neck: No neck mass. CTA HEAD Anterior circulation: Internal carotid arteries are patent from skullbase to carotid termini without significant stenosis. ACAs and MCAs are patent with mild-to-moderate branch vessel irregularity and attenuation bilaterally but no significant proximal stenosis. No intracranial aneurysm is identified. Posterior circulation: Intracranial vertebral arteries are patent with the left being dominant. The right vertebral artery is particularly hypoplastic distal to the PICA origin. PICA and SCA origins are patent bilaterally. Basilar artery is patent and slightly small in caliber diffusely without a focal stenosis. There is likely a small posterior communicating artery on the left. PCAs are patent with mild to moderate branch vessel irregularity and attenuation bilaterally but no significant proximal stenosis. Venous sinuses: Patent. Anatomic variants: None of significance. Delayed phase: No abnormal enhancement. IMPRESSION: 1. Patent major  arterial vasculature of the neck without evidence of dissection or stenosis. 2. No evidence of major intracranial arterial occlusion or significant proximal stenosis. Mild-to-moderate branch vessel irregularity and attenuation which may reflect atherosclerosis. 3. Advanced cervical disc degeneration with multilevel neural foraminal stenosis. Electronically Signed   By: Sebastian Ache M.D.   On: 09/12/2015 12:11   I have personally reviewed and evaluated these images and lab results as part of my medical decision-making.   EKG Interpretation   Date/Time:  Monday September 12 2015 09:52:50 EST Ventricular Rate:  65 PR Interval:  198 QRS Duration: 87 QT Interval:  415 QTC Calculation: 431 R Axis:   55 Text Interpretation:  Sinus rhythm No significant change since last  tracing Confirmed by Jerrika Ledlow ([id number]) on 09/12/2015 1:25:49  PM      MDM  Patient seen and evaluated in stable condition.  Patient evaluated about 3 hours after onset of symptoms but NIHSS 0.  No focal neurologic findings on examination.  Did discuss case with Dr. Lu Duffel from neurology because of time frame and complaint of tingling.  Expressed I would be more concerned for dissection process and he recommended CTA head and neck.  CTAs were negative for dissection or acute infarct.  Degenerative disc disease noted.  Pain improved with valium and morphine.  Labs including troponin x2 unremarkable.  Discussed all results with patient who expressed understanding and agreement with plan for discharged and outpatient follow up with his PCP.  He was discharged home in stable condition with strict return precautions and with prescriptions for Valium.  All questions answered prior to discharge. Final diagnoses:  Left arm pain  Neck pain    1. Cervical radiculopathy    Leta Baptist, MD 09/12/15 2259

## 2015-09-12 NOTE — ED Notes (Signed)
MD at bedside. 

## 2015-09-12 NOTE — ED Notes (Signed)
MD back at bedside for reassessment.

## 2015-09-12 NOTE — ED Notes (Signed)
Patient transported to X-ray 

## 2015-09-12 NOTE — ED Notes (Signed)
Remains in CT

## 2015-11-08 IMAGING — DX DG CHEST 2V
2 series · 2 of 2 positions shown · non-contrast
Comparison: 01/14/2013

CLINICAL DATA: Right-sided chest and low back pain for 1 week.
Diabetes and hypertension.

EXAM:
CHEST  2 VIEW

[chest pa]
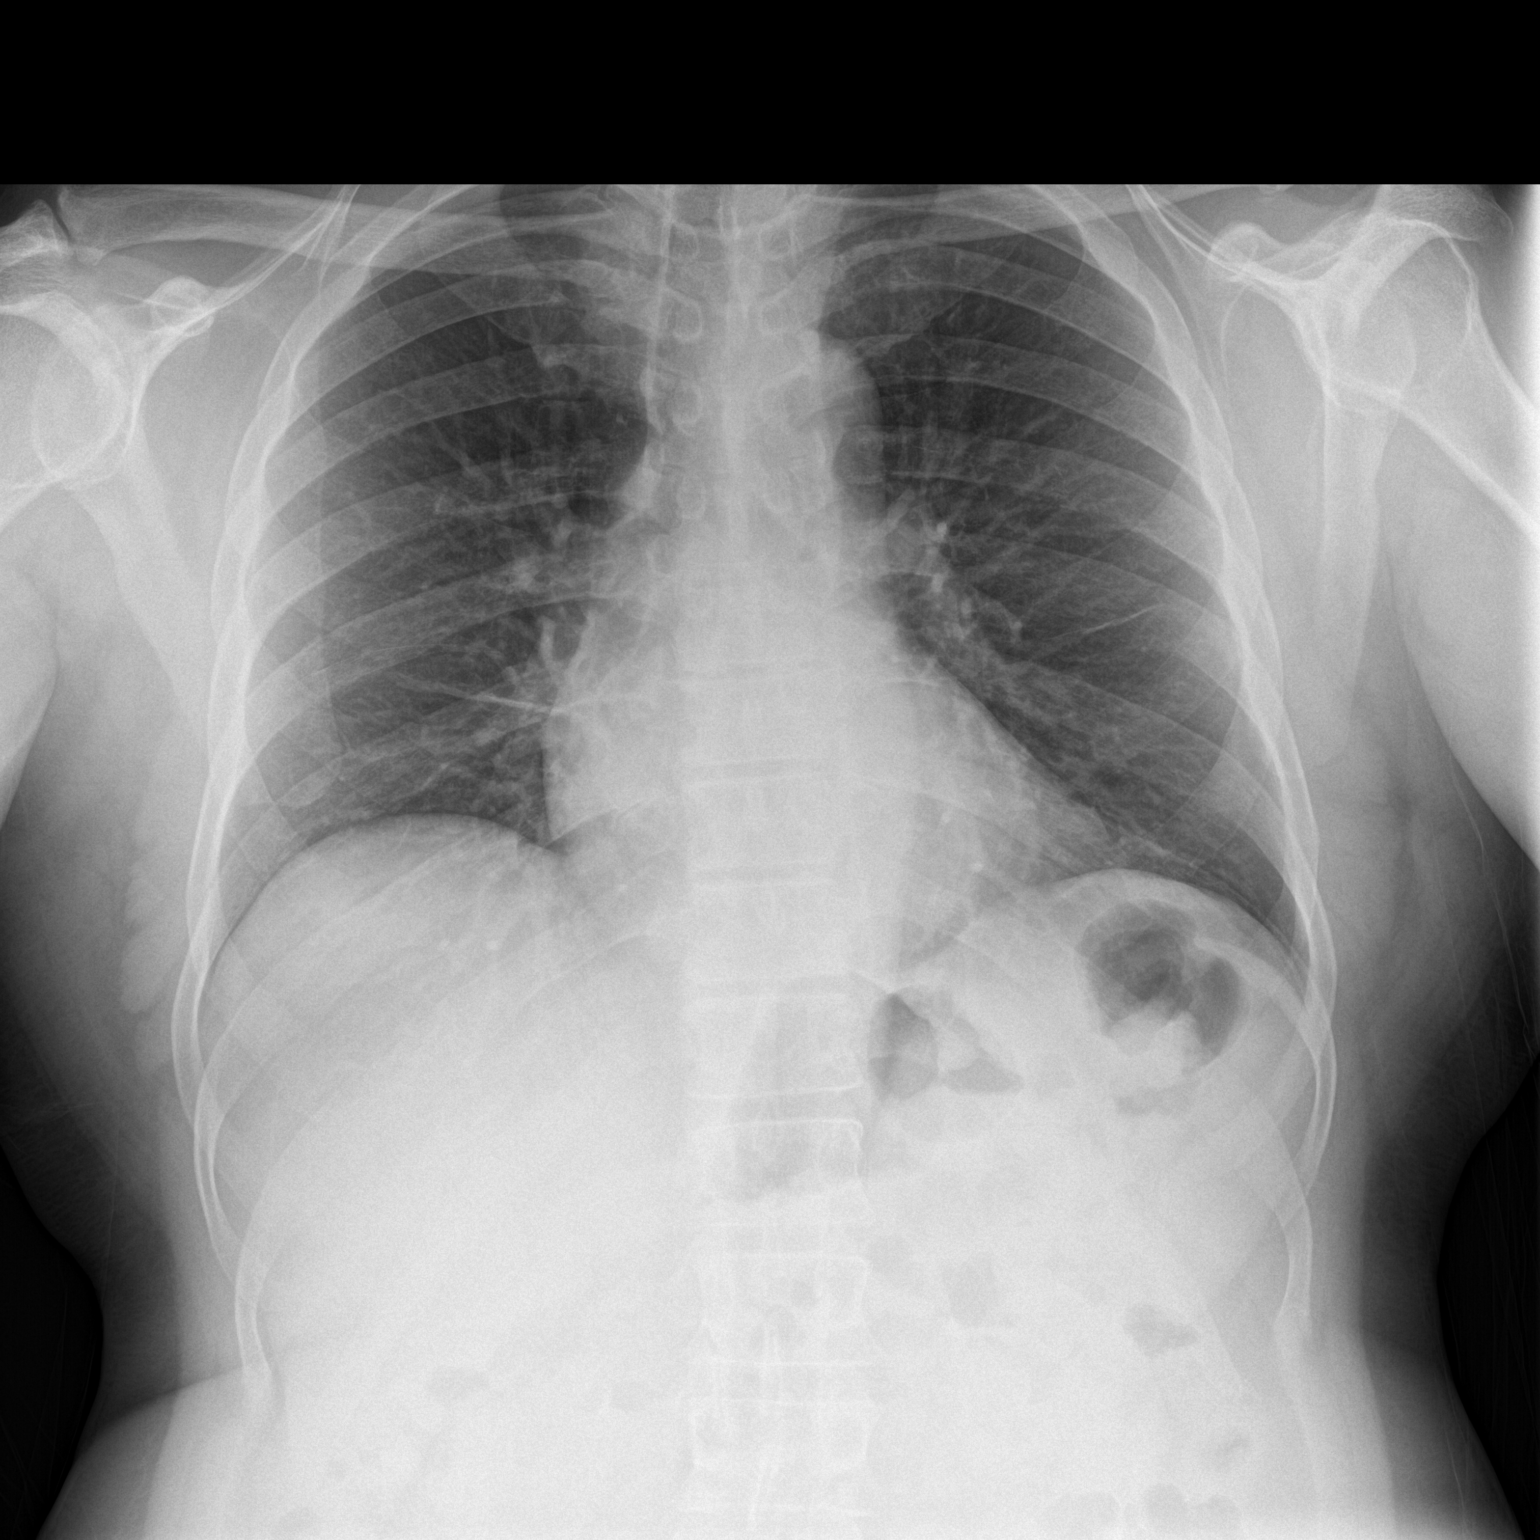

[chest lat]
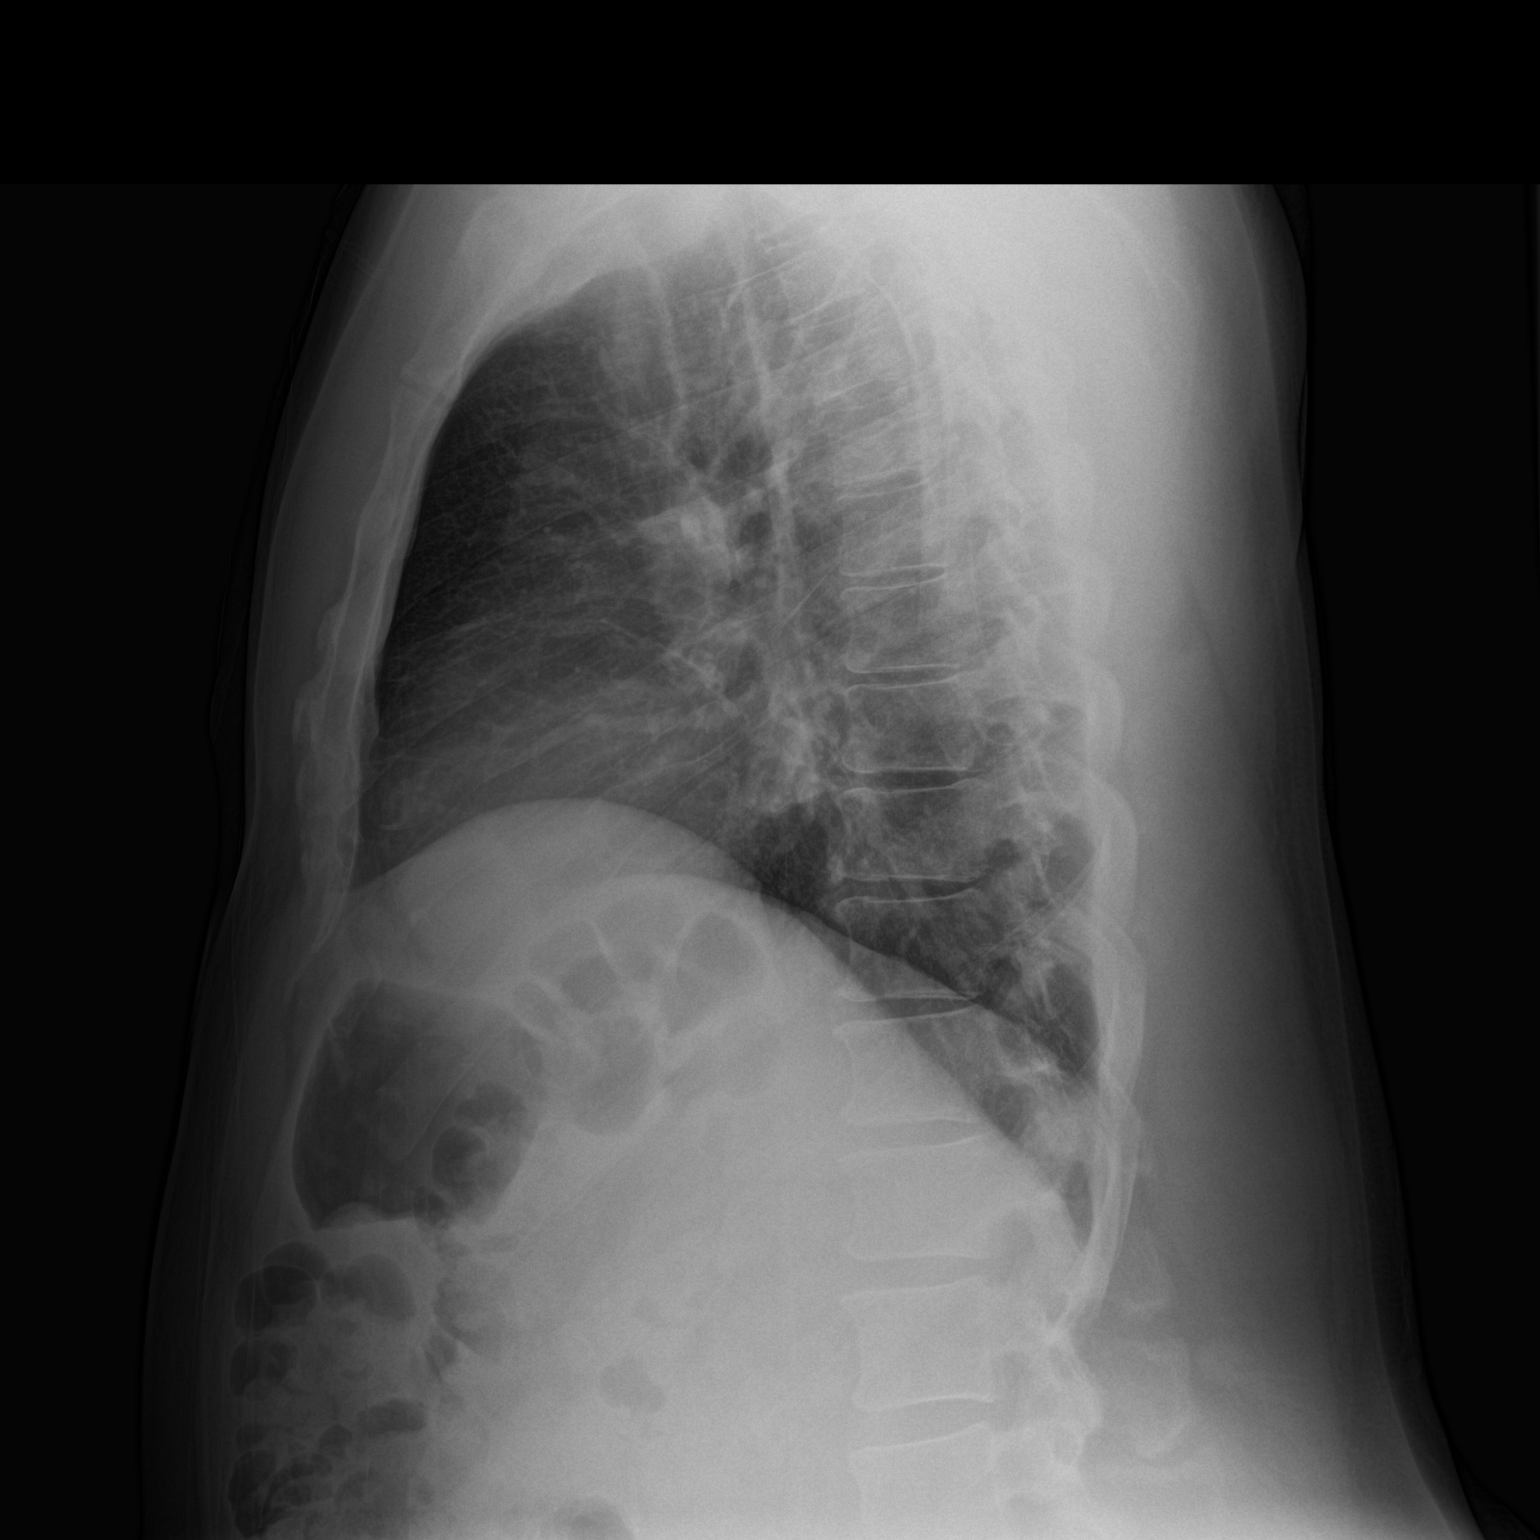

[2 of 2 positions shown; findings below may reference images not displayed]

FINDINGS: The heart size and mediastinal contours are within normal limits.
Mild scarring is seen in the perihilar regions bilaterally. No
evidence of pulmonary infiltrate or edema. No evidence pleural
effusion. No mass or lymphadenopathy identified. The visualized
skeletal structures are unremarkable.
IMPRESSION: No active cardiopulmonary disease.

## 2015-11-08 IMAGING — CT CT ABD-PELV W/O CM
2 of 3 series · 4 of 46 positions shown, 5 images · non-contrast
Comparison: None.

CLINICAL DATA: Right flank pain starting a week ago

EXAM:
CT ABDOMEN AND PELVIS WITHOUT CONTRAST
TECHNIQUE: Multidetector CT imaging of the abdomen and pelvis was performed
following the standard protocol without IV contrast.

[Series 207: cor · coronal · 0.50mm/px · 3 of 142 slices shown, 4 images]
[im 32/142  soft-tissue]
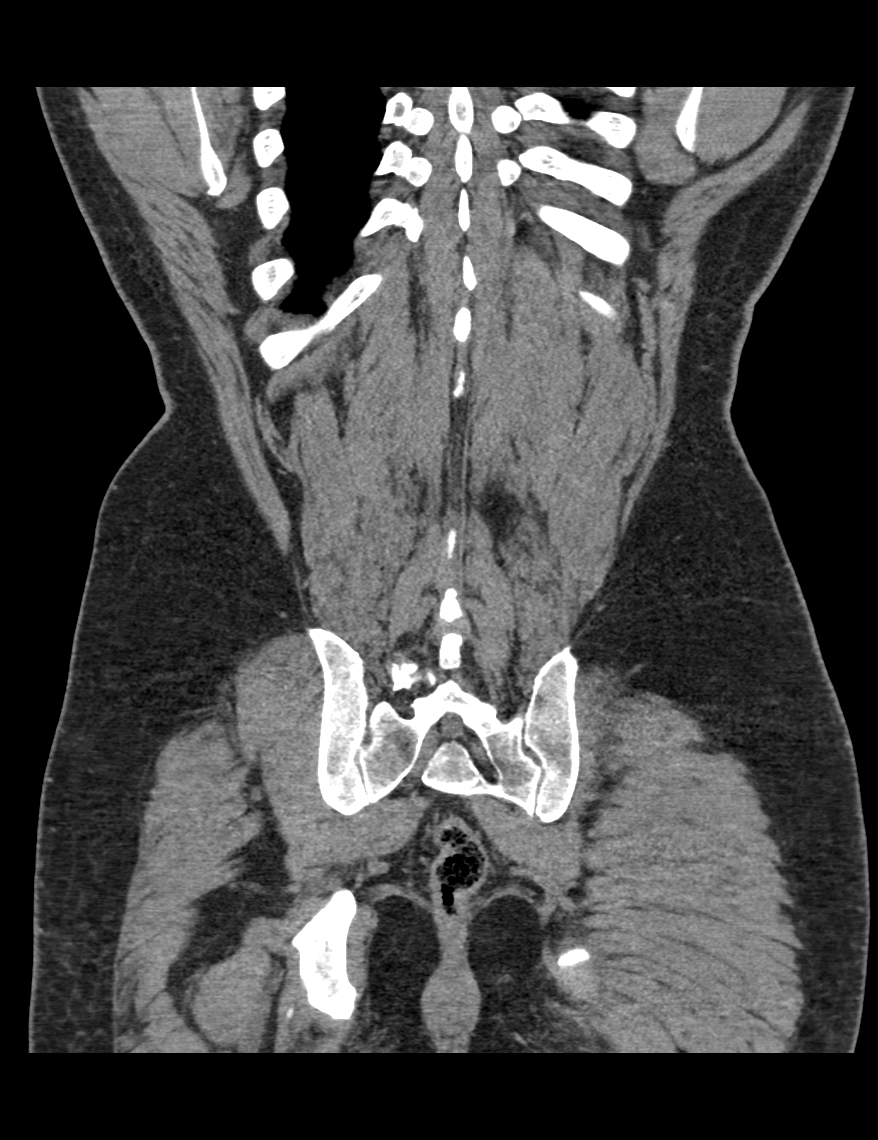
[im 32/142  bone]
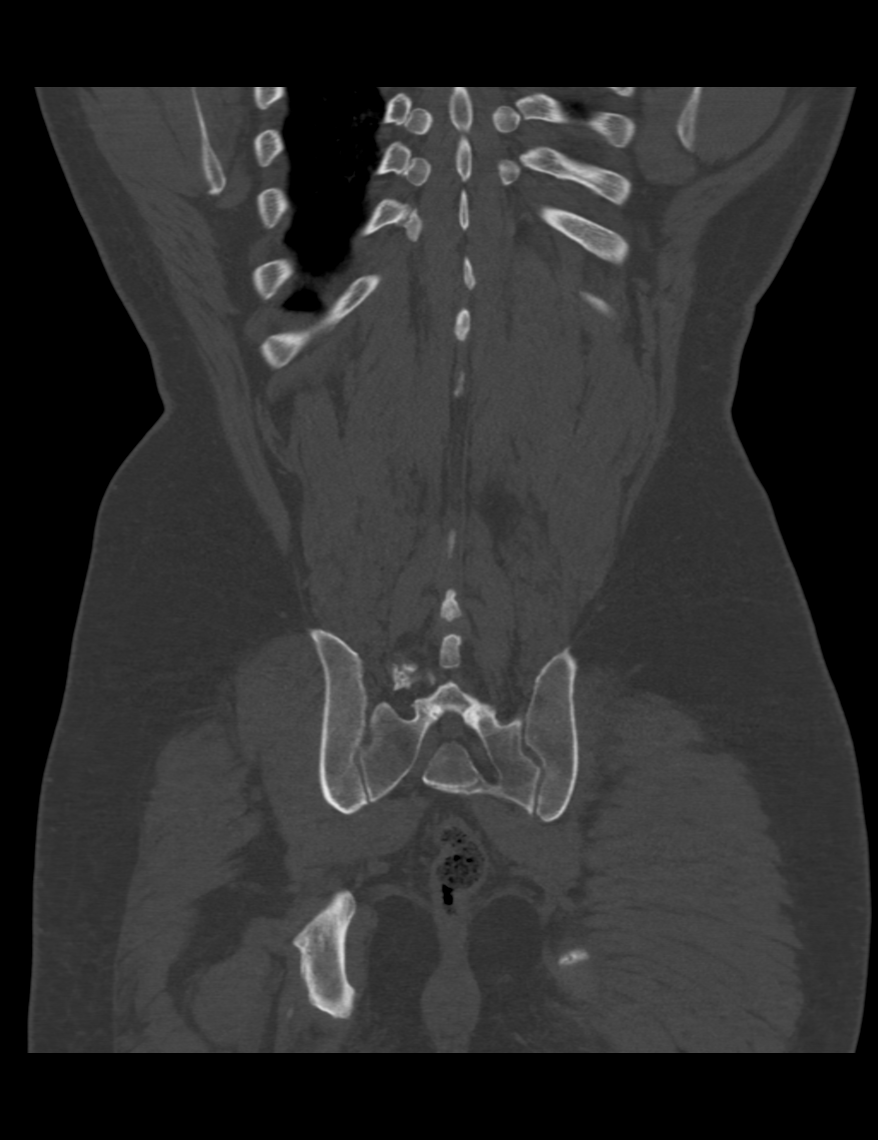
[im 79/142  soft-tissue]
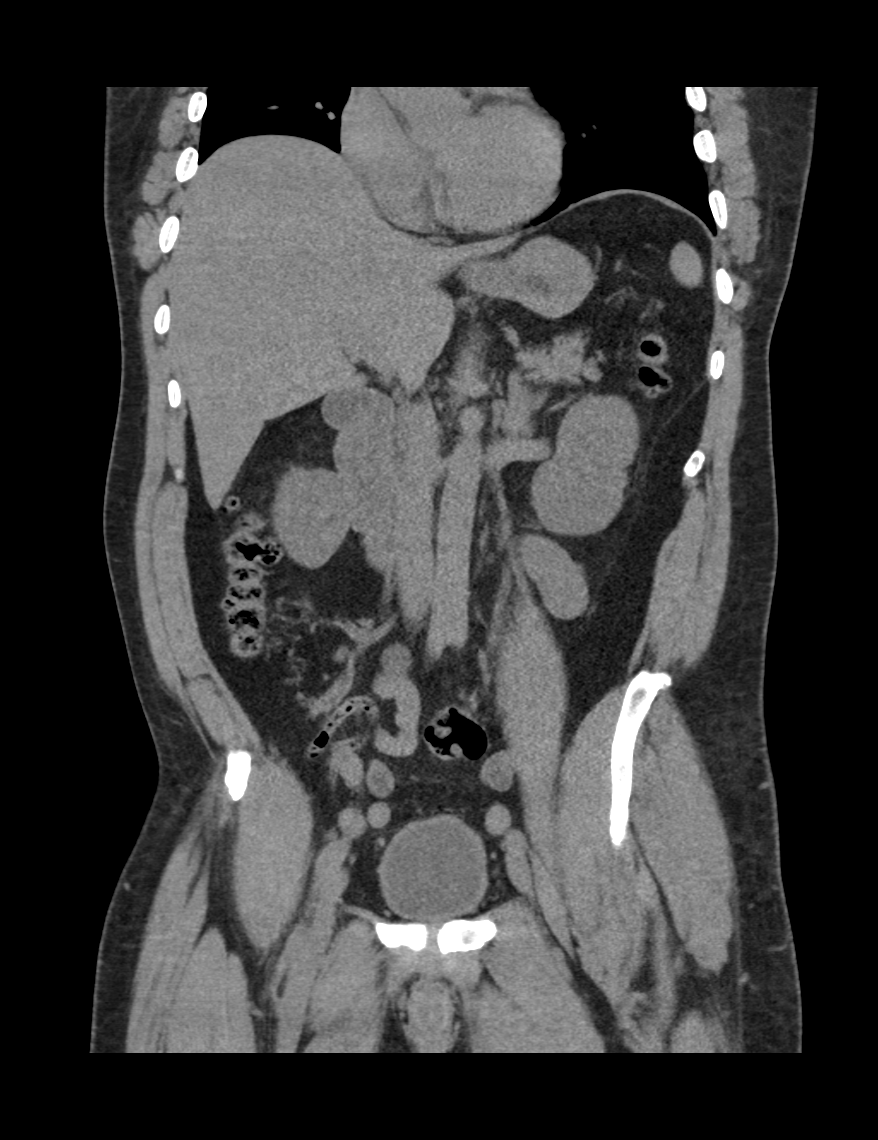
[im 110/142  soft-tissue]
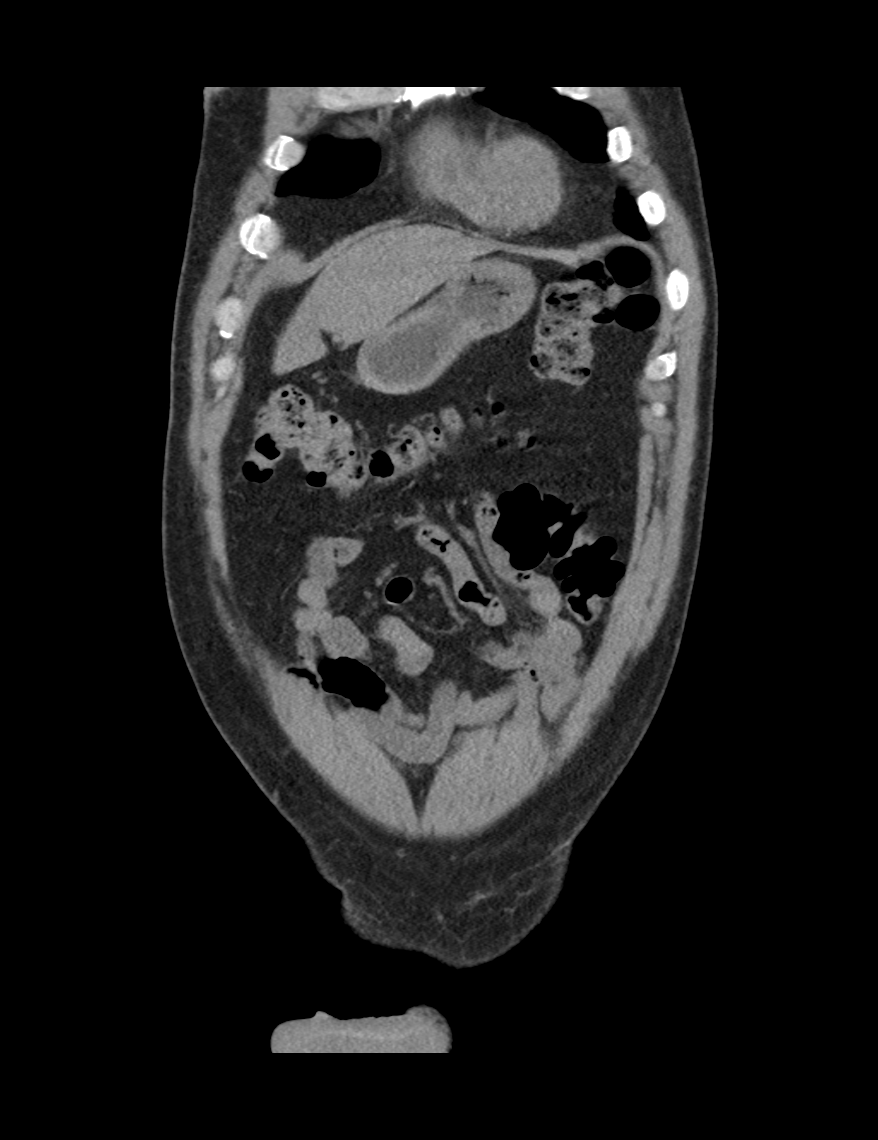

[Series 208: sag · sagittal · 0.50mm/px · 1 of 183 slices shown]
[im 61/183  soft-tissue]
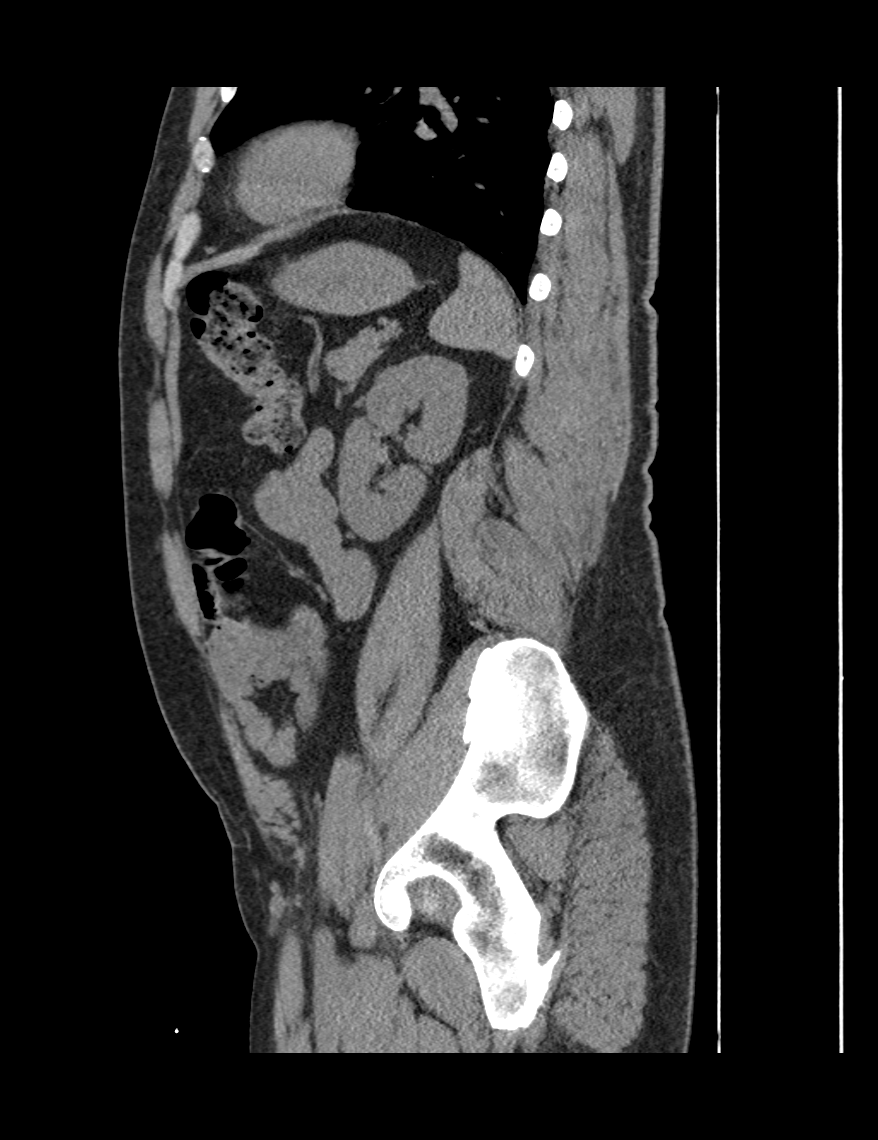

[4 of 46 positions shown; findings below may reference images not displayed]

FINDINGS: Lung bases shows mild atelectasis. Unenhanced liver shows no biliary
ductal dilatation. No calcified gallstones are noted within
gallbladder. Unenhanced pancreas, spleen and adrenal glands are
unremarkable. Unenhanced kidneys are symmetrical in size. A punctate
nonobstructive calculus in upper pole of the left kidney measures 2
mm. No hydronephrosis or hydroureter. No calcified ureteral calculi.

Mild indentation of urinary bladder base by prostate gland. Prostate
gland measures 4.5 by 3.6 cm.

No small bowel obstruction. Normal appendix. No pericecal
inflammation. The terminal ileum is unremarkable. No calcified
calculi are noted within urinary bladder. Sagittal images of the
spine shows disc space flattening with vacuum disc phenomenon at
L5-S1 level. Facet degenerative changes L3, L4 and L5 level.

No aortic aneurysm.
IMPRESSION: 1. There is left nonobstructive nephrolithiasis. No hydronephrosis
or hydroureter.
2. No calcified ureteral calculi are noted.
3. No pericecal inflammation.  Normal appendix.
4. Minimal enlarged prostate gland with indentation of urinary
bladder base.
5. No calcified calculi are noted within urinary bladder.
6. No small bowel obstruction.

## 2015-12-18 IMAGING — CT CT HEAD W/O CM
4 of 5 series · 14 of 33 positions shown, 16 images · non-contrast
Comparison: Head CT scan 09/11/2012.

CLINICAL DATA: Status post fall today with a blow to the back of
the head. Dizziness prior to the fall. Initial encounter.

EXAM:
CT HEAD WITHOUT CONTRAST
CT CERVICAL SPINE WITHOUT CONTRAST
TECHNIQUE: Multidetector CT imaging of the head and cervical spine was
performed following the standard protocol without intravenous
contrast. Multiplanar CT image reconstructions of the cervical spine
were also generated.

[Series 6: 2.0 mm soft tissue · axial · 0.30mm/px · z∈[-184,-150]mm · 2 of 87 slices shown]
[im 18/87  soft-tissue]
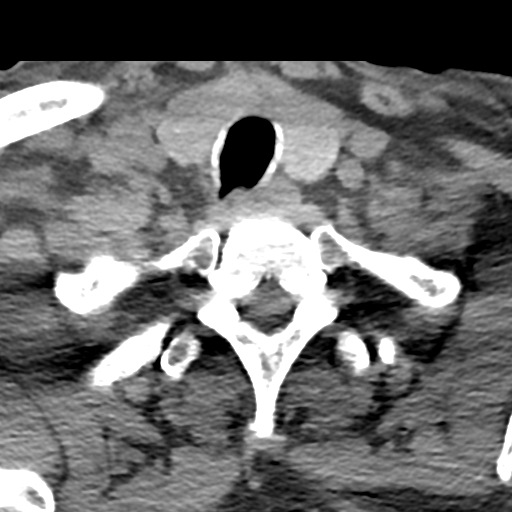
[im 35/87  soft-tissue]
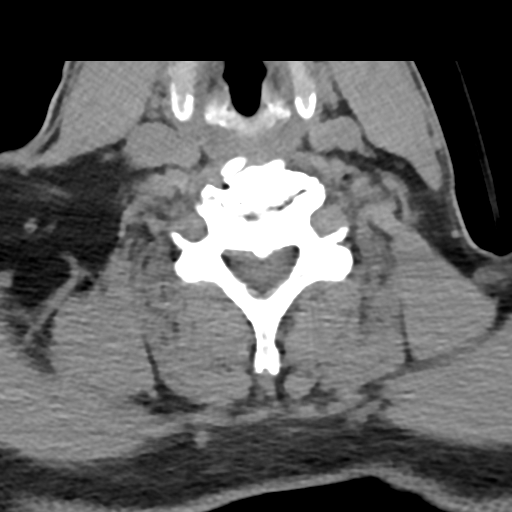

[Series 9: coronals · coronal · 0.19mm/px · 3 of 52 slices shown]
[im 11/52  bone]
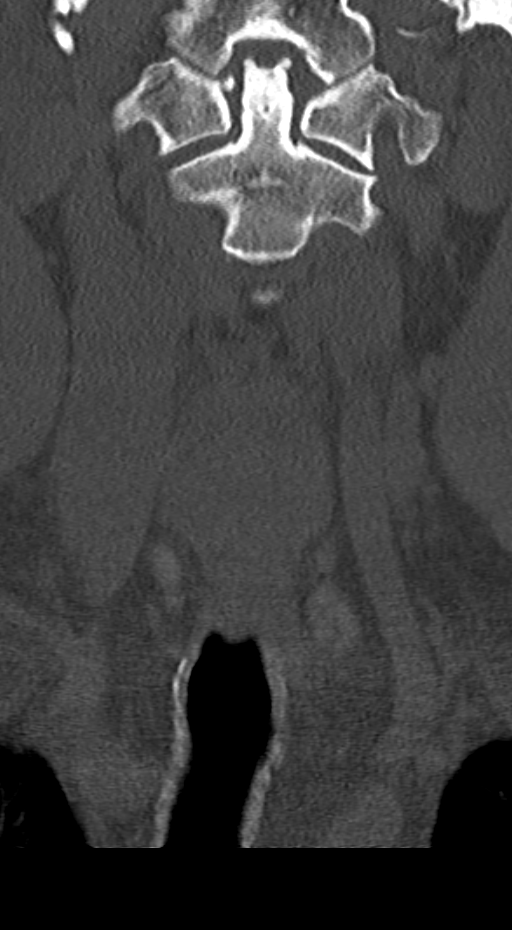
[im 21/52  bone]
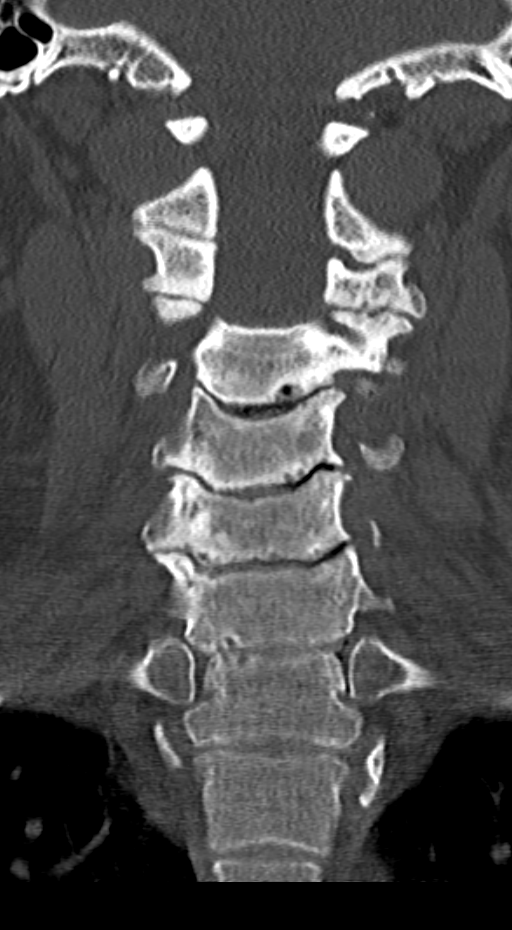
[im 31/52  bone]
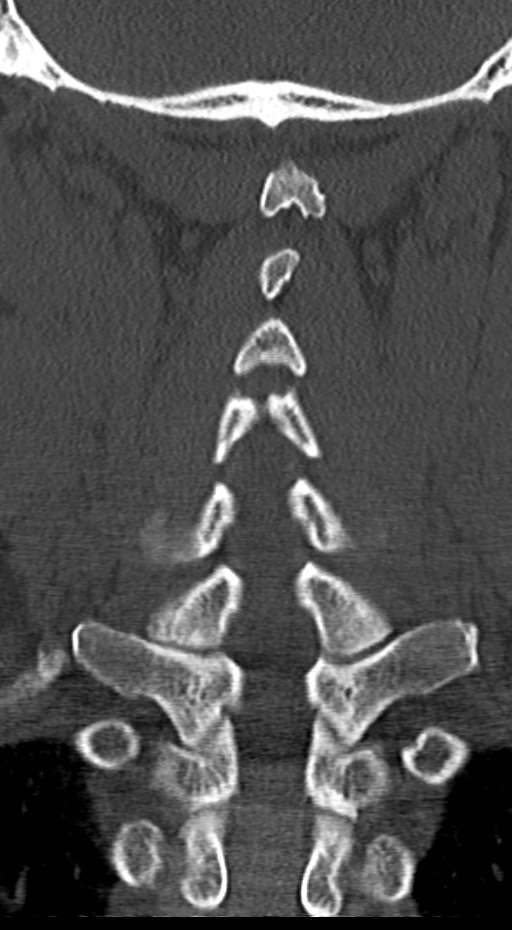

[Series 11: orthogonals · axial · 0.23mm/px · z∈[-209,-117]mm · 4 of 86 slices shown, 5 images]
[im 18/86  soft-tissue]
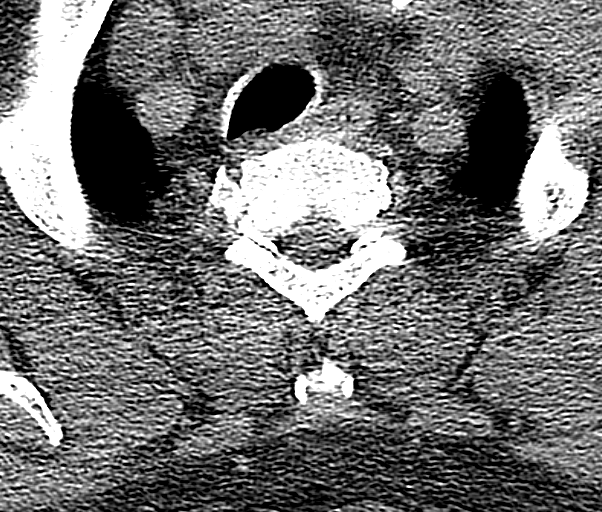
[im 18/86  bone]
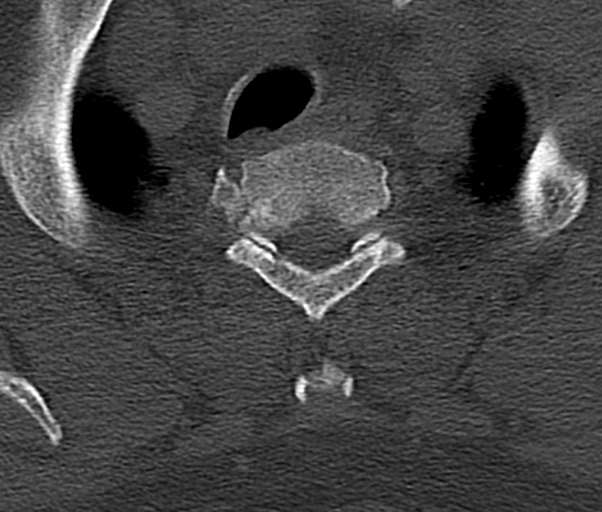
[im 35/86  bone]
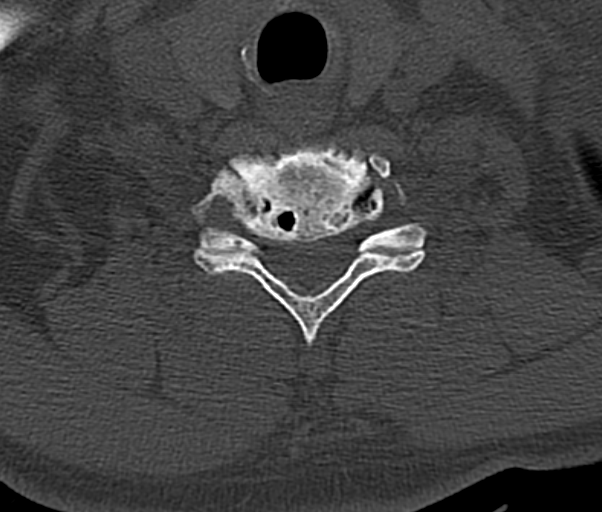
[im 52/86  bone]
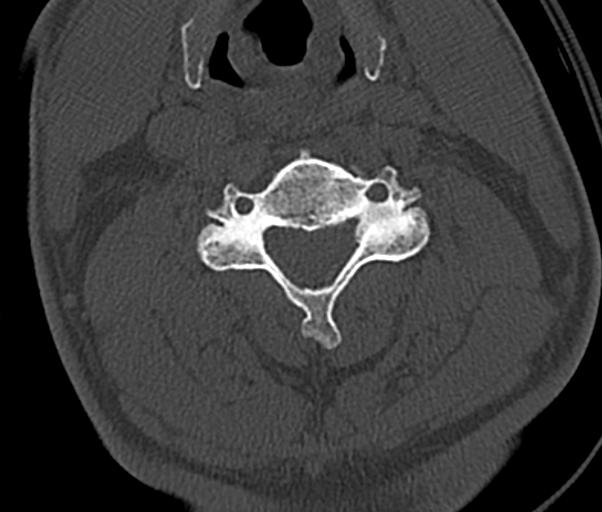
[im 69/86  bone]
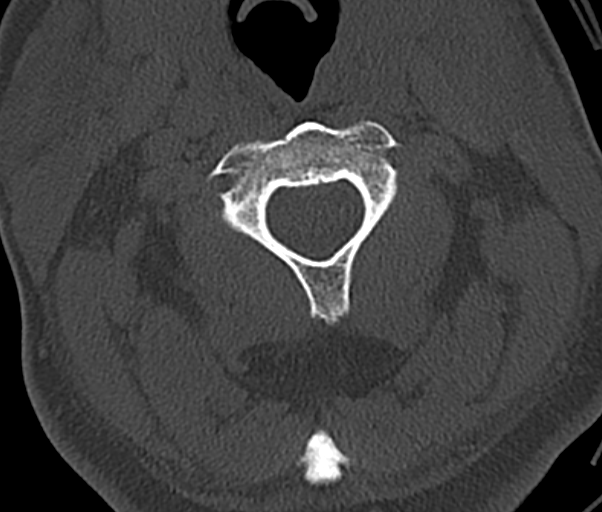

[Series 602: sagittals · sagittal · 0.34mm/px · 5 of 54 slices shown, 6 images]
[im 18/54  bone]
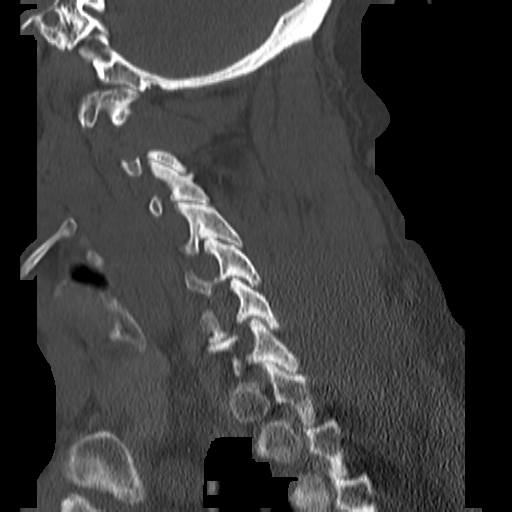
[im 23/54  bone]
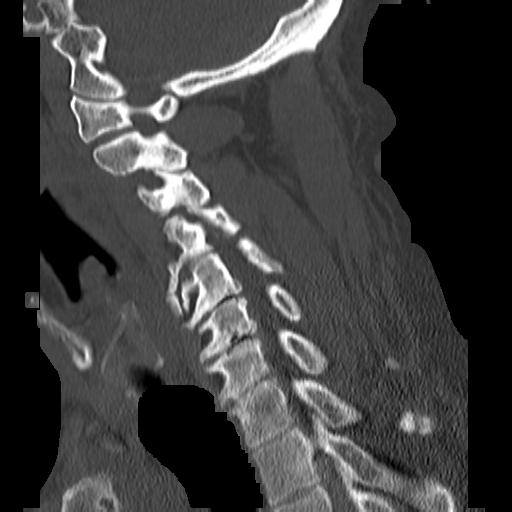
[im 27/54  soft-tissue]
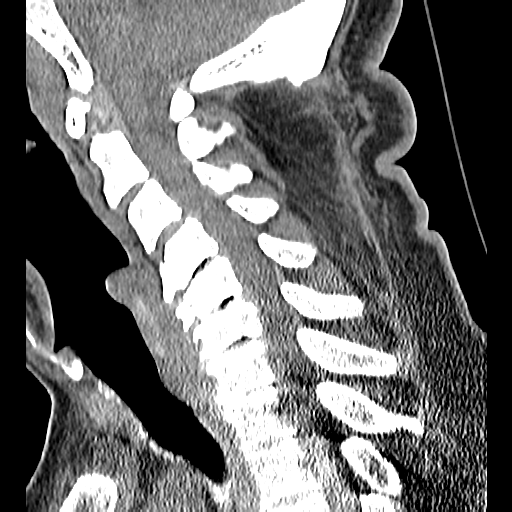
[im 27/54  bone]
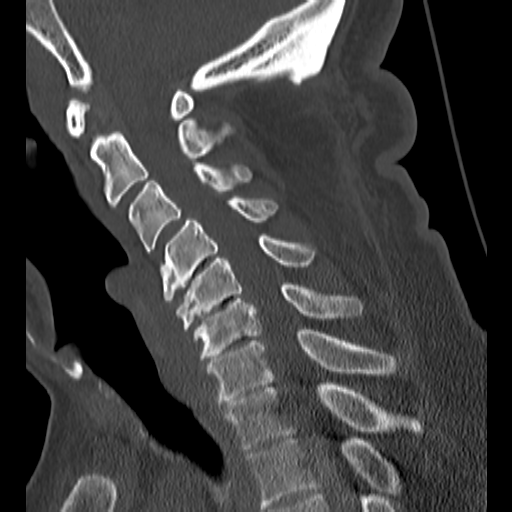
[im 31/54  bone]
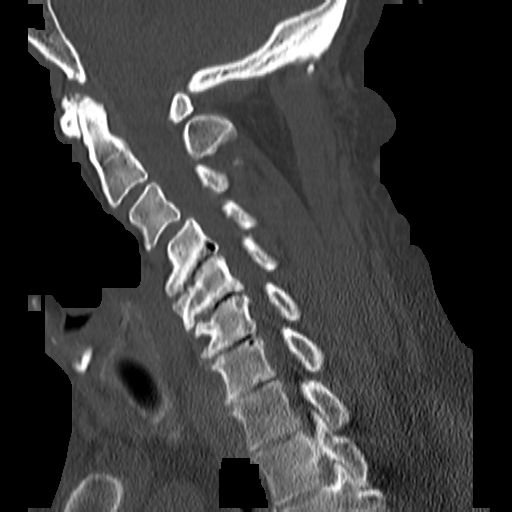
[im 36/54  bone]
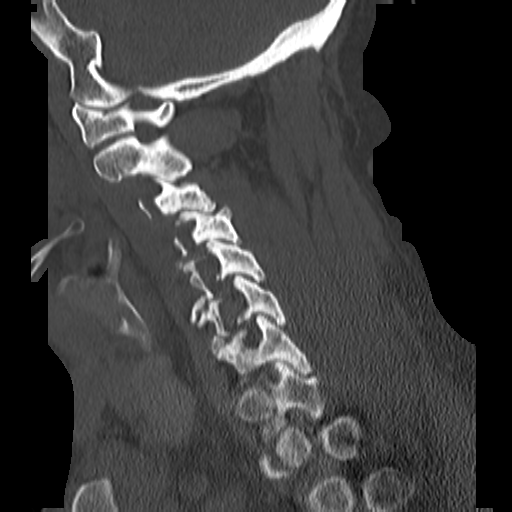

[14 of 33 positions shown; findings below may reference images not displayed]

FINDINGS: CT HEAD FINDINGS

The brain appears normal without hemorrhage, infarct, mass lesion,
mass effect, midline shift or abnormal extra-axial fluid collection.
No hydrocephalus or pneumocephalus. The calvarium is intact. Imaged
paranasal sinuses and mastoid air cells are clear.

CT CERVICAL SPINE FINDINGS

No fracture or malalignment of the cervical spine is identified. The
patient has marked loss of disc space height with endplate spurring
from C4-T1. Scattered facet degenerative change appears worst on the
left at C3-4. Lung apices are clear.
IMPRESSION: No acute finding head or cervical spine.

Advanced for age appearing cervical spondylosis.

## 2015-12-18 IMAGING — CR DG ANKLE COMPLETE 3+V*R*
3 series · 3 of 3 positions shown · non-contrast
Comparison: Right foot series of today October 19, 2010

CLINICAL DATA: Status post fall into a hole today with right ankle
injury

EXAM:
RIGHT ANKLE - COMPLETE 3+ VIEW

[ankle ap]
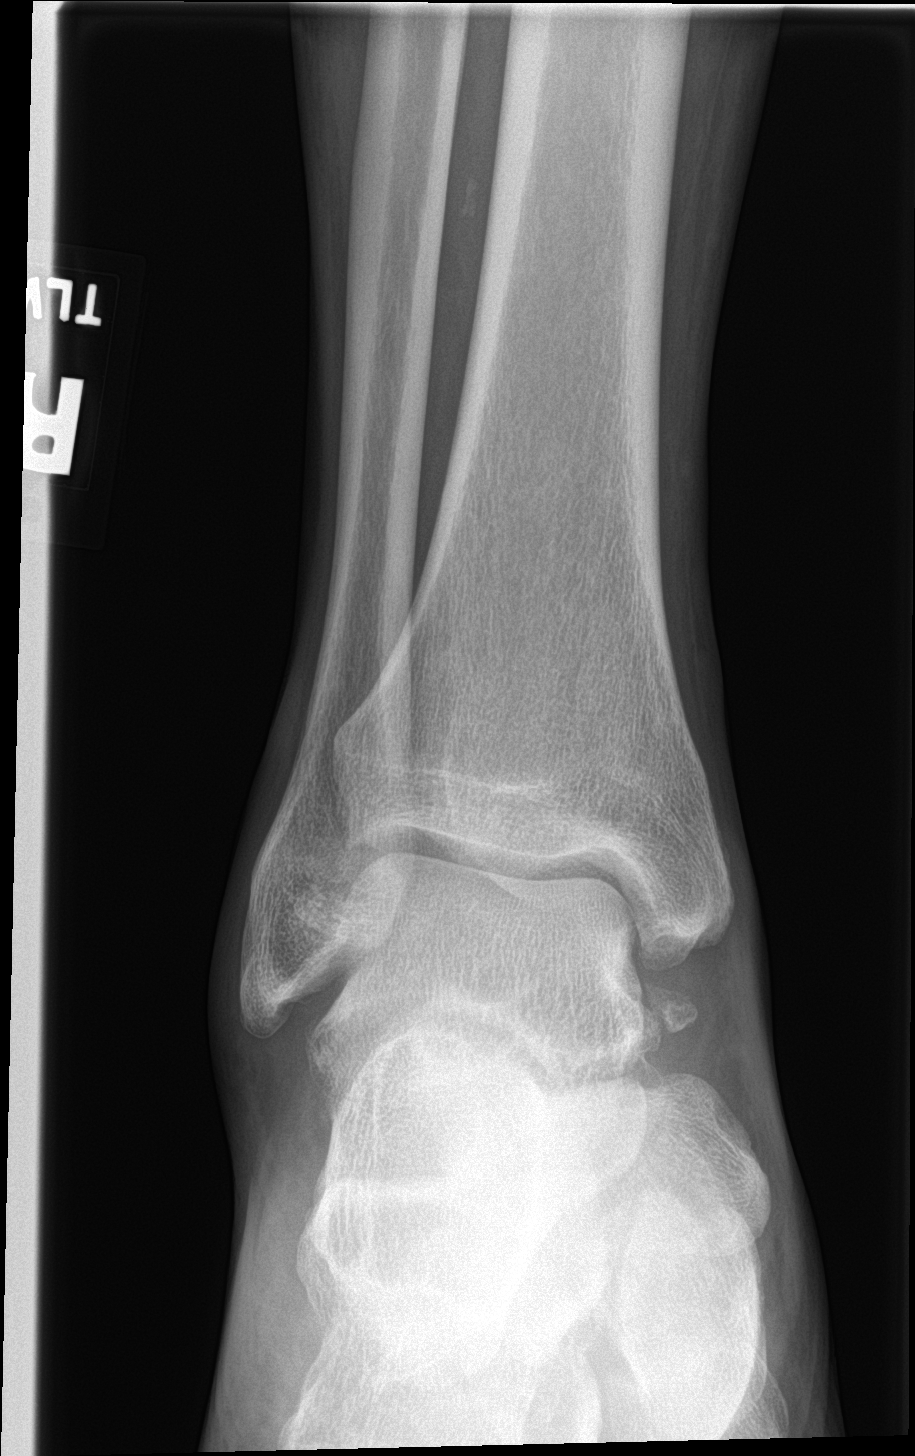

[ankle obl]
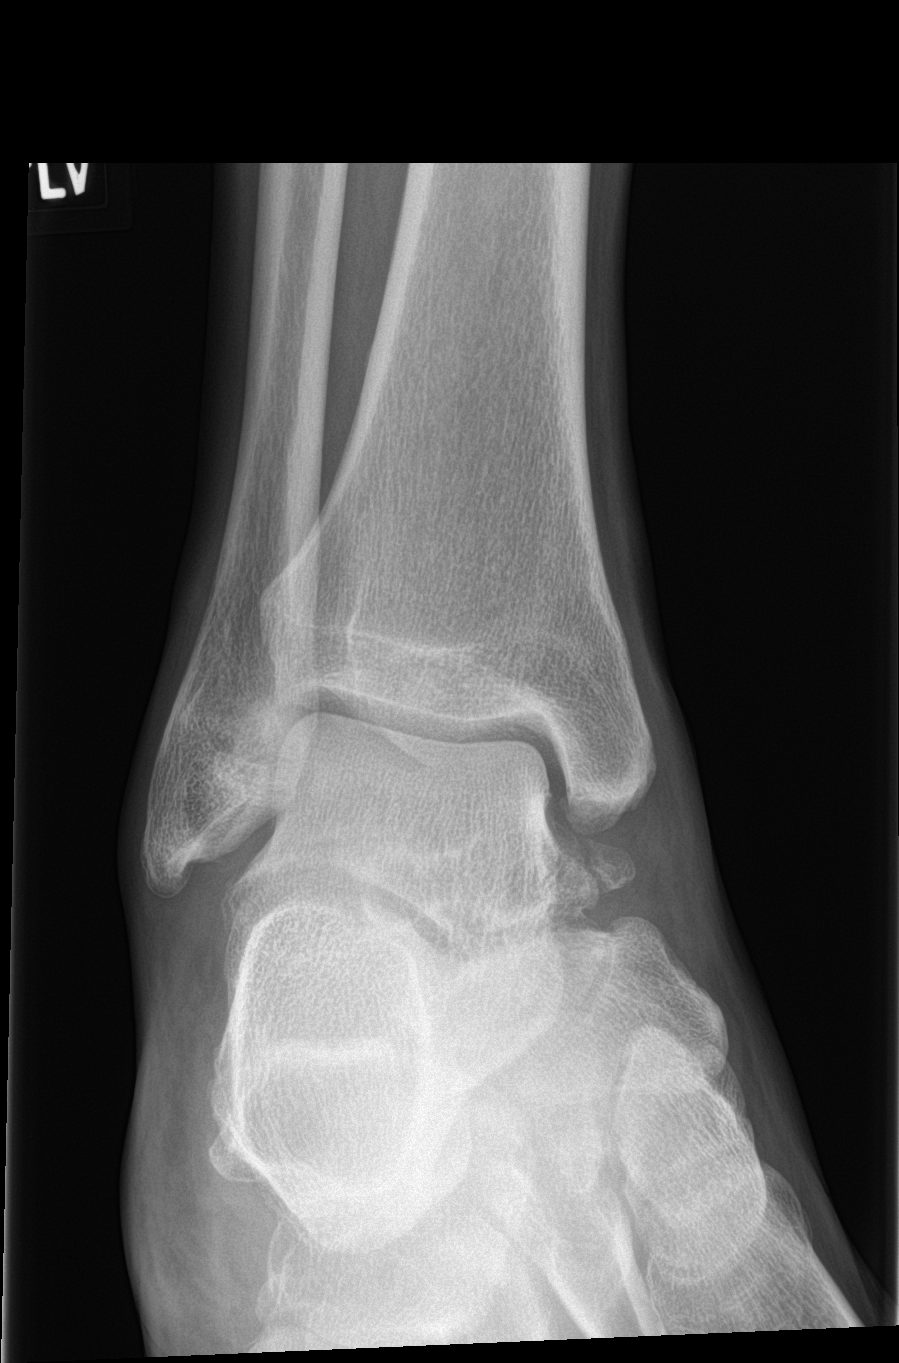

[ankle lat]
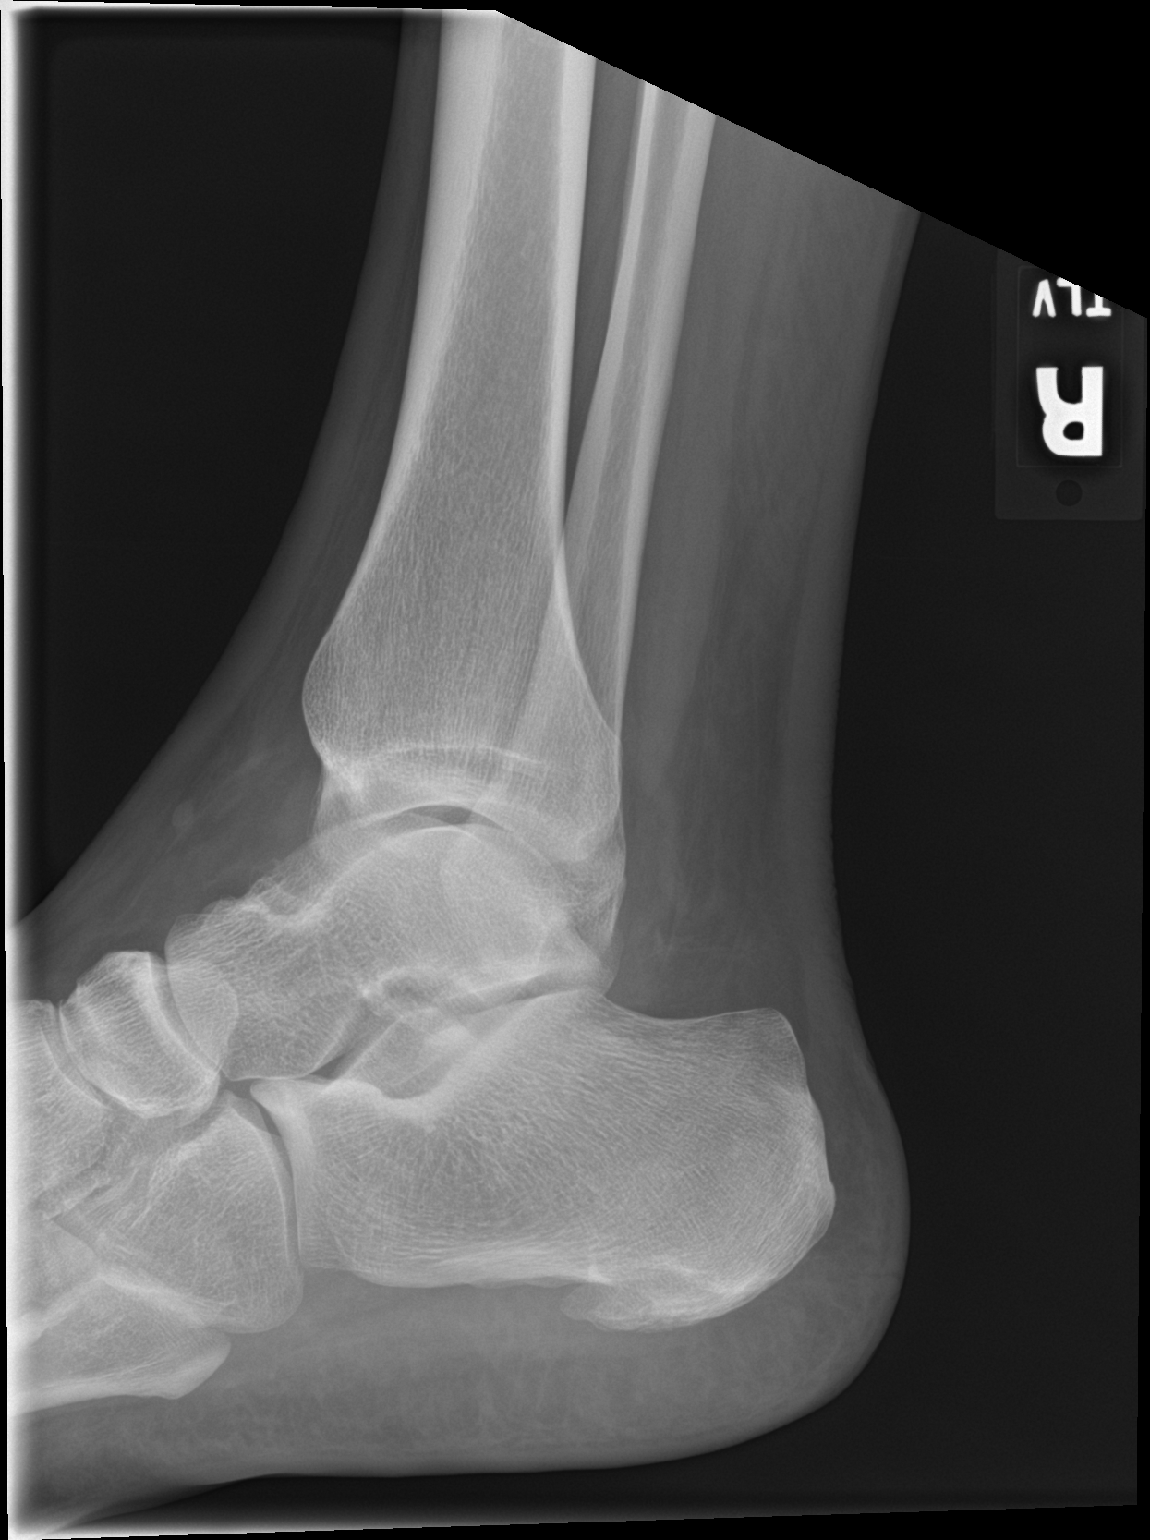

[3 of 3 positions shown; findings below may reference images not displayed]

FINDINGS: The ankle joint mortise is preserved. The talar dome is intact.
There is no acute malleolar fracture. There is a bony density
adjacent to the medial aspect of the talus. It has an irregular
surface adjacent to the talus and may reflect an avulsion from the
talus. The calcaneus is intact. There is a moderate-sized plantar
calcaneal spur. There is soft tissue swelling anteriorly.
IMPRESSION: There is no acute dislocation of the ankle nor acute malleolar
fracture. One cannot exclude an avulsion from the medial aspect of
the talus. A subtle bony density similar in appearance was noted on
a previous right foot film. Correlation with any symptoms
immediately inferior to the medial malleolus would be useful.

CT scanning or MRI may be needed to evaluate the ankle more
completely.

## 2015-12-19 ENCOUNTER — Other Ambulatory Visit: Payer: Self-pay | Admitting: Cardiovascular Disease

## 2016-03-09 ENCOUNTER — Telehealth: Payer: Self-pay | Admitting: Cardiovascular Disease

## 2016-03-09 NOTE — Telephone Encounter (Signed)
Unable to reach patient at this time 

## 2016-03-09 NOTE — Telephone Encounter (Signed)
NEw Message  Pt c/o numbness in both arms- some pain when raising arms- couple weeks. Please call back and discuss.

## 2016-03-09 NOTE — Telephone Encounter (Signed)
No answer on number provided. Message states "The person you are trying to reach is unavailable right now". Will try to call again later.

## 2016-03-13 ENCOUNTER — Inpatient Hospital Stay (HOSPITAL_COMMUNITY): Payer: Medicaid Other

## 2016-03-13 ENCOUNTER — Encounter (HOSPITAL_COMMUNITY): Payer: Self-pay

## 2016-03-13 ENCOUNTER — Emergency Department (HOSPITAL_COMMUNITY): Payer: Medicaid Other

## 2016-03-13 ENCOUNTER — Inpatient Hospital Stay (HOSPITAL_COMMUNITY)
Admission: EM | Admit: 2016-03-13 | Discharge: 2016-03-17 | DRG: 247 | Disposition: A | Discharge status: Home / Self Care | Payer: Medicaid Other | Attending: Cardiovascular Disease | Admitting: Cardiovascular Disease

## 2016-03-13 DIAGNOSIS — E7800 Pure hypercholesterolemia, unspecified: Secondary | ICD-10-CM | POA: Diagnosis present

## 2016-03-13 DIAGNOSIS — M4802 Spinal stenosis, cervical region: Secondary | ICD-10-CM | POA: Diagnosis present

## 2016-03-13 DIAGNOSIS — I10 Essential (primary) hypertension: Secondary | ICD-10-CM | POA: Diagnosis present

## 2016-03-13 DIAGNOSIS — I200 Unstable angina: Secondary | ICD-10-CM | POA: Diagnosis not present

## 2016-03-13 DIAGNOSIS — I119 Hypertensive heart disease without heart failure: Secondary | ICD-10-CM

## 2016-03-13 DIAGNOSIS — R072 Precordial pain: Secondary | ICD-10-CM | POA: Diagnosis present

## 2016-03-13 DIAGNOSIS — Z7982 Long term (current) use of aspirin: Secondary | ICD-10-CM

## 2016-03-13 DIAGNOSIS — I25110 Atherosclerotic heart disease of native coronary artery with unstable angina pectoris: Secondary | ICD-10-CM | POA: Diagnosis present

## 2016-03-13 DIAGNOSIS — E785 Hyperlipidemia, unspecified: Secondary | ICD-10-CM | POA: Diagnosis present

## 2016-03-13 DIAGNOSIS — I959 Hypotension, unspecified: Secondary | ICD-10-CM | POA: Diagnosis not present

## 2016-03-13 DIAGNOSIS — Z8659 Personal history of other mental and behavioral disorders: Secondary | ICD-10-CM

## 2016-03-13 DIAGNOSIS — F1910 Other psychoactive substance abuse, uncomplicated: Secondary | ICD-10-CM | POA: Diagnosis present

## 2016-03-13 DIAGNOSIS — M109 Gout, unspecified: Secondary | ICD-10-CM | POA: Diagnosis present

## 2016-03-13 DIAGNOSIS — Z8249 Family history of ischemic heart disease and other diseases of the circulatory system: Secondary | ICD-10-CM

## 2016-03-13 DIAGNOSIS — I441 Atrioventricular block, second degree: Secondary | ICD-10-CM | POA: Diagnosis present

## 2016-03-13 DIAGNOSIS — E876 Hypokalemia: Secondary | ICD-10-CM | POA: Diagnosis not present

## 2016-03-13 DIAGNOSIS — Z91013 Allergy to seafood: Secondary | ICD-10-CM | POA: Diagnosis not present

## 2016-03-13 DIAGNOSIS — R079 Chest pain, unspecified: Secondary | ICD-10-CM | POA: Diagnosis present

## 2016-03-13 DIAGNOSIS — Z8673 Personal history of transient ischemic attack (TIA), and cerebral infarction without residual deficits: Secondary | ICD-10-CM

## 2016-03-13 DIAGNOSIS — I442 Atrioventricular block, complete: Secondary | ICD-10-CM | POA: Diagnosis present

## 2016-03-13 DIAGNOSIS — I25118 Atherosclerotic heart disease of native coronary artery with other forms of angina pectoris: Secondary | ICD-10-CM

## 2016-03-13 DIAGNOSIS — M4722 Other spondylosis with radiculopathy, cervical region: Secondary | ICD-10-CM | POA: Diagnosis present

## 2016-03-13 DIAGNOSIS — E119 Type 2 diabetes mellitus without complications: Secondary | ICD-10-CM | POA: Diagnosis present

## 2016-03-13 DIAGNOSIS — Z955 Presence of coronary angioplasty implant and graft: Secondary | ICD-10-CM

## 2016-03-13 DIAGNOSIS — M79602 Pain in left arm: Secondary | ICD-10-CM

## 2016-03-13 DIAGNOSIS — I2510 Atherosclerotic heart disease of native coronary artery without angina pectoris: Secondary | ICD-10-CM | POA: Diagnosis present

## 2016-03-13 DIAGNOSIS — I252 Old myocardial infarction: Secondary | ICD-10-CM

## 2016-03-13 HISTORY — DX: Spinal stenosis, cervical region: M48.02

## 2016-03-13 HISTORY — DX: Angina pectoris, unspecified: I20.9

## 2016-03-13 HISTORY — DX: Atherosclerotic heart disease of native coronary artery without angina pectoris: I25.10

## 2016-03-13 LAB — I-STAT TROPONIN, ED: TROPONIN I, POC: 0.01 ng/mL (ref 0.00–0.08)

## 2016-03-13 LAB — CREATININE, SERUM
Creatinine, Ser: 1.23 mg/dL (ref 0.61–1.24)
GFR calc non Af Amer: >60 mL/min (ref >60)

## 2016-03-13 LAB — CBC
HCT: 46.7 % (ref 39.0–52.0)
HEMATOCRIT: 44.4 % (ref 39.0–52.0)
HEMOGLOBIN: 15.8 g/dL (ref 13.0–17.0)
HEMOGLOBIN: 15 g/dL (ref 13.0–17.0)
MCH: 30.7 pg (ref 26.0–34.0)
MCH: 30.9 pg (ref 26.0–34.0)
MCHC: 33.8 g/dL (ref 30.0–36.0)
MCHC: 33.8 g/dL (ref 30.0–36.0)
MCV: 90.9 fL (ref 78.0–100.0)
MCV: 91.4 fL (ref 78.0–100.0)
Platelets: 193 10*3/uL (ref 150–400)
Platelets: 194 10*3/uL (ref 150–400)
RBC: 5.14 MIL/uL (ref 4.22–5.81)
RBC: 4.86 MIL/uL (ref 4.22–5.81)
RDW: 13.7 % (ref 11.5–15.5)
RDW: 13.7 % (ref 11.5–15.5)
WBC: 4.1 10*3/uL (ref 4.0–10.5)
WBC: 4 10*3/uL (ref 4.0–10.5)

## 2016-03-13 LAB — BASIC METABOLIC PANEL
ANION GAP: 10 (ref 5–15)
BUN: 7 mg/dL (ref 6–20)
CALCIUM: 9.2 mg/dL (ref 8.9–10.3)
CO2: 18 mmol/L — AB (ref 22–32)
Chloride: 109 mmol/L (ref 101–111)
Creatinine, Ser: 1.1 mg/dL (ref 0.61–1.24)
Glucose, Bld: 125 mg/dL — ABNORMAL HIGH (ref 65–99)
Potassium: 3.7 mmol/L (ref 3.5–5.1)
Sodium: 137 mmol/L (ref 135–145)

## 2016-03-13 LAB — HEPATIC FUNCTION PANEL
ALT: 37 U/L (ref 17–63)
AST: 37 U/L (ref 15–41)
Albumin: 3.6 g/dL (ref 3.5–5.0)
Alkaline Phosphatase: 38 U/L (ref 38–126)
Bilirubin, Direct: 0.2 mg/dL (ref 0.1–0.5)
Indirect Bilirubin: 0.5 mg/dL (ref 0.3–0.9)
Total Bilirubin: 0.7 mg/dL (ref 0.3–1.2)
Total Protein: 6.9 g/dL (ref 6.5–8.1)

## 2016-03-13 LAB — SEDIMENTATION RATE: Sed Rate: 3 mm/hr (ref 0–16)

## 2016-03-13 LAB — TROPONIN I
Troponin I: <0.03 ng/mL (ref <0.031)
Troponin I: <0.03 ng/mL (ref <0.031)

## 2016-03-13 LAB — MAGNESIUM: Magnesium: 2 mg/dL (ref 1.7–2.4)

## 2016-03-13 LAB — TSH: TSH: 2.215 u[IU]/mL (ref 0.350–4.500)

## 2016-03-13 LAB — MRSA PCR SCREENING: MRSA BY PCR: NEGATIVE

## 2016-03-13 LAB — T4, FREE: Free T4: 0.86 ng/dL (ref 0.61–1.12)

## 2016-03-13 LAB — GLUCOSE, CAPILLARY: GLUCOSE-CAPILLARY: 116 mg/dL — AB (ref 65–99)

## 2016-03-13 MED ORDER — CYCLOBENZAPRINE HCL 10 MG PO TABS
10.0000 mg | ORAL_TABLET | Freq: Three times a day (TID) | ORAL | Status: DC
  Administered 2016-03-13 – 2016-03-17 (×11): 10 mg via ORAL
  Filled 2016-03-13 (×11): qty 1

## 2016-03-13 MED ORDER — ONDANSETRON HCL 4 MG/2ML IJ SOLN: 4.0000 mg | Freq: Four times a day (QID) | INTRAMUSCULAR | Status: DC | PRN

## 2016-03-13 MED ORDER — NITROGLYCERIN IN D5W 200-5 MCG/ML-% IV SOLN
5.0000 ug/min | INTRAVENOUS | Status: DC
  Administered 2016-03-13 – 2016-03-15 (×2): 5 ug/min via INTRAVENOUS
  Filled 2016-03-13 (×2): qty 250

## 2016-03-13 MED ORDER — ALPRAZOLAM 0.25 MG PO TABS
0.2500 mg | ORAL_TABLET | Freq: Two times a day (BID) | ORAL | Status: DC | PRN
  Administered 2016-03-13: 0.25 mg via ORAL
  Filled 2016-03-13: qty 1

## 2016-03-13 MED ORDER — ZOLPIDEM TARTRATE 5 MG PO TABS: 5.0000 mg | ORAL_TABLET | Freq: Every evening | ORAL | Status: DC | PRN

## 2016-03-13 MED ORDER — GADOBENATE DIMEGLUMINE 529 MG/ML IV SOLN
15.0000 mL | Freq: Once | INTRAVENOUS | Status: AC | PRN
  Administered 2016-03-14: 15 mL via INTRAVENOUS

## 2016-03-13 MED ORDER — HEPARIN SODIUM (PORCINE) 5000 UNIT/ML IJ SOLN
5000.0000 [IU] | Freq: Three times a day (TID) | INTRAMUSCULAR | Status: DC
  Administered 2016-03-13 – 2016-03-15 (×4): 5000 [IU] via SUBCUTANEOUS
  Filled 2016-03-13 (×4): qty 1

## 2016-03-13 MED ORDER — CYCLOBENZAPRINE HCL 10 MG PO TABS
5.0000 mg | ORAL_TABLET | Freq: Three times a day (TID) | ORAL | Status: DC
  Administered 2016-03-13: 5 mg via ORAL
  Filled 2016-03-13: qty 1

## 2016-03-13 MED ORDER — MORPHINE SULFATE (PF) 2 MG/ML IV SOLN
2.0000 mg | INTRAVENOUS | Status: DC | PRN
  Administered 2016-03-13 – 2016-03-15 (×4): 2 mg via INTRAVENOUS
  Filled 2016-03-13 (×5): qty 1

## 2016-03-13 MED ORDER — ACETAMINOPHEN 325 MG PO TABS: 650.0000 mg | ORAL_TABLET | ORAL | Status: DC | PRN

## 2016-03-13 MED ORDER — PRAVASTATIN SODIUM 40 MG PO TABS
40.0000 mg | ORAL_TABLET | Freq: Every day | ORAL | Status: DC
  Administered 2016-03-13 – 2016-03-17 (×5): 40 mg via ORAL
  Filled 2016-03-13 (×5): qty 1

## 2016-03-13 MED ORDER — ASPIRIN 81 MG PO CHEW: 324.0000 mg | CHEWABLE_TABLET | ORAL | Status: AC

## 2016-03-13 MED ORDER — LOSARTAN POTASSIUM 50 MG PO TABS
100.0000 mg | ORAL_TABLET | Freq: Every day | ORAL | Status: DC
  Administered 2016-03-13 – 2016-03-17 (×5): 100 mg via ORAL
  Filled 2016-03-13 (×5): qty 2

## 2016-03-13 MED ORDER — ASPIRIN EC 81 MG PO TBEC
81.0000 mg | DELAYED_RELEASE_TABLET | Freq: Every day | ORAL | Status: DC
  Administered 2016-03-14 – 2016-03-17 (×4): 81 mg via ORAL
  Filled 2016-03-13 (×4): qty 1

## 2016-03-13 MED ORDER — HYDROCODONE-ACETAMINOPHEN 5-325 MG PO TABS
1.0000 | ORAL_TABLET | ORAL | Status: DC | PRN
  Administered 2016-03-13 – 2016-03-15 (×6): 2 via ORAL
  Administered 2016-03-15: 1 via ORAL
  Administered 2016-03-15: 2 via ORAL
  Administered 2016-03-15: 1 via ORAL
  Administered 2016-03-16: 2 via ORAL
  Administered 2016-03-16: 1 via ORAL
  Administered 2016-03-16 – 2016-03-17 (×4): 2 via ORAL
  Filled 2016-03-13: qty 1
  Filled 2016-03-13 (×6): qty 2
  Filled 2016-03-13: qty 1
  Filled 2016-03-13 (×2): qty 2
  Filled 2016-03-13: qty 1
  Filled 2016-03-13 (×4): qty 2

## 2016-03-13 MED ORDER — NITROGLYCERIN 2 % TD OINT
1.0000 [in_us] | TOPICAL_OINTMENT | Freq: Four times a day (QID) | TRANSDERMAL | Status: DC
  Administered 2016-03-13: 1 [in_us] via TOPICAL
  Filled 2016-03-13: qty 1

## 2016-03-13 MED ORDER — NITROGLYCERIN 0.4 MG SL SUBL
0.4000 mg | SUBLINGUAL_TABLET | SUBLINGUAL | Status: DC | PRN
  Administered 2016-03-15: 0.4 mg via SUBLINGUAL

## 2016-03-13 MED ORDER — SODIUM CHLORIDE 0.9 % IV SOLN
INTRAVENOUS | Status: DC
Start: 2016-03-13 — End: 2016-03-16
  Administered 2016-03-13: 17:00:00 via INTRAVENOUS

## 2016-03-13 MED ORDER — ALLOPURINOL 300 MG PO TABS
300.0000 mg | ORAL_TABLET | Freq: Every day | ORAL | Status: DC
  Administered 2016-03-14 – 2016-03-17 (×4): 300 mg via ORAL
  Filled 2016-03-13 (×4): qty 1

## 2016-03-13 MED ORDER — HYDROCHLOROTHIAZIDE 25 MG PO TABS
25.0000 mg | ORAL_TABLET | Freq: Every day | ORAL | Status: DC
  Administered 2016-03-13 – 2016-03-17 (×5): 25 mg via ORAL
  Filled 2016-03-13 (×5): qty 1

## 2016-03-13 MED ORDER — NITROGLYCERIN 0.4 MG SL SUBL: 0.4000 mg | SUBLINGUAL_TABLET | SUBLINGUAL | Status: DC | PRN

## 2016-03-13 MED ORDER — METOPROLOL TARTRATE 12.5 MG HALF TABLET
12.5000 mg | ORAL_TABLET | Freq: Two times a day (BID) | ORAL | Status: DC
  Administered 2016-03-13 – 2016-03-14 (×2): 12.5 mg via ORAL
  Filled 2016-03-13 (×2): qty 1

## 2016-03-13 MED ORDER — SODIUM CHLORIDE 0.9 % IV BOLUS (SEPSIS)
250.0000 mL | Freq: Once | INTRAVENOUS | Status: AC
  Administered 2016-03-13: 250 mL via INTRAVENOUS

## 2016-03-13 MED ORDER — ASPIRIN 300 MG RE SUPP: 300.0000 mg | RECTAL | Status: AC

## 2016-03-13 NOTE — ED Provider Notes (Signed)
CSN: [id number]     Arrival date & time 03/13/16  1140 History   First MD Initiated Contact with Patient 03/13/16 1155     Chief Complaint  Patient presents with  . Chest Pain     (Consider location/radiation/quality/duration/timing/severity/associated sxs/prior Treatment) HPI Comments: Patient complaining of intermittent substernal chest pain that is worse with exertion as well as movement. Pain hss been relieved with nitroglycerin. He has had some associated dyspnea as well as diaphoresis. States that the symptoms are similar to his anginal equivalent. Pain didn't radiate to his left arm. That was also exertion as well 2. Denied any slurred speech or facial droop. Denies any weakness to his left side at this time. Although he did have some earlier. Called his cardiologist and was told to come here. Patient states that he received nitroglycerin glycerin by EMS and pain was greatly improved after 2 doses. He was also given aspirin.  Patient is a 60 y.o. male presenting with chest pain. The history is provided by the patient.  Chest Pain   Past Medical History  Diagnosis Date  . Hyperglycemia   . Gout, unspecified   . Hypercholesterolemia   . HTN (hypertension)   . Blood in stool   . Migraines   . Stroke (HCC)   . Arthritis   . Pneumonia   . Depression   . Myocardial infarction (HCC)     drug cocaine and marijuana at time of mi none  since    . Cocaine abuse     history  . Normal cardiac stress test 07/2013  . Headache   . CHF (congestive heart failure) (HCC)   . Diabetes mellitus without complication (HCC)   . Renal insufficiency    Past Surgical History  Procedure Laterality Date  . Reapir of left inguinal hernia  02/02/2003    Dr. Violeta Gelinas. repair with mesh. Same procedure done again in  05/22/2004   . Rotator cuff repair  2010    left   . Cardiac catheterization  2008    [place]-40%lad  . Reconstruct / stabilize distal ulna    . Inguinal hernia repair  05/12/2012     Procedure: HERNIA REPAIR INGUINAL ADULT;  Surgeon: Liz Malady, MD;  [facility];  Service: General;  Laterality: Right;  . Hernia repair     Family History  Problem Relation Age of Onset  . Heart attack Mother     x7. Still alive  . Heart attack Father     deceased b/c of MI  . Heart attack Brother     older, deceased b/c of MI    Social History  Substance Use Topics  . Smoking status: Never Smoker   . Smokeless tobacco: Never Used  . Alcohol Use: 4.2 oz/week    7 Cans of beer per week     Comment: occasional beer    Review of Systems  Cardiovascular: Positive for chest pain.  All other systems reviewed and are negative.     Allergies  Shellfish allergy  Home Medications   Prior to Admission medications   Medication Sig Start Date End Date Taking? Authorizing Provider  allopurinol (ZYLOPRIM) 300 MG tablet Take 1 tablet (300 mg total) by mouth daily. 11/16/14   Wendall Stade, MD  aspirin EC 325 MG tablet Take 325 mg by mouth daily.      Historical Provider, MD  CALCIUM-VITAMIN D PO Take 1 tablet by mouth daily.    Historical Provider, MD  cyclobenzaprine (FLEXERIL) 10 MG tablet Take 1 tablet (10 mg total) by mouth 3 (three) times daily as needed. For muscle spasms 11/16/14   Wendall Stade, MD  diazepam (VALIUM) 5 MG tablet Take 1 tablet (5 mg total) by mouth every 6 (six) hours as needed for muscle spasms. 09/12/15   Leta Baptist, MD  hydrochlorothiazide (HYDRODIURIL) 25 MG tablet Take 1 tablet (25 mg total) by mouth daily. 11/16/14   Wendall Stade, MD  ibuprofen (ADVIL,MOTRIN) 800 MG tablet Take 1 tablet (800 mg total) by mouth 3 (three) times daily. Patient taking differently: Take 800 mg by mouth every 8 (eight) hours as needed for moderate pain.  03/16/15   Danelle Berry, PA-C  indomethacin (INDOCIN) 25 MG capsule Take 1 capsule (25 mg total) by mouth 3 (three) times daily with meals. 11/26/13   Wendall Stade, MD  Iron-Vitamins (GERITOL  PO) Take 2 tablets by mouth daily.    Historical Provider, MD  losartan (COZAAR) 100 MG tablet Take 1 tablet (100 mg total) by mouth daily. 12/03/14   Wendall Stade, MD  losartan (COZAAR) 100 MG tablet TAKE ONE TABLET BY MOUTH DAILY 12/20/15   Wendall Stade, MD  meclizine (ANTIVERT) 25 MG tablet Take 25 mg by mouth 3 (three) times daily as needed. For vertigo    Historical Provider, MD  nitroGLYCERIN (NITROSTAT) 0.4 MG SL tablet Place 1 tablet (0.4 mg total) under the tongue every 5 (five) minutes as needed for chest pain. 11/16/14   Wendall Stade, MD  pravastatin (PRAVACHOL) 40 MG tablet Take 1 tablet (40 mg total) by mouth daily. 11/16/14   Wendall Stade, MD   BP 158/101 mmHg  Pulse 75  Temp(Src) 98.5 F (36.9 C) (Oral)  Resp 17  SpO2 100% Physical Exam  Constitutional: He is oriented to person, place, and time. He appears well-developed and well-nourished.  Non-toxic appearance. No distress.  HENT:  Head: Normocephalic and atraumatic.  Eyes: Conjunctivae, EOM and lids are normal. Pupils are equal, round, and reactive to light.  Neck: Normal range of motion. Neck supple. No tracheal deviation present. No thyroid mass present.  Cardiovascular: Normal rate, regular rhythm and normal heart sounds.  Exam reveals no gallop.   No murmur heard. Pulmonary/Chest: Effort normal and breath sounds normal. No stridor. No respiratory distress. He has no decreased breath sounds. He has no wheezes. He has no rhonchi. He has no rales.  Abdominal: Soft. Normal appearance and bowel sounds are normal. He exhibits no distension. There is no tenderness. There is no rebound and no CVA tenderness.  Musculoskeletal: Normal range of motion. He exhibits no edema or tenderness.  Neurological: He is alert and oriented to person, place, and time. He has normal strength. No cranial nerve deficit or sensory deficit. GCS eye subscore is 4. GCS verbal subscore is 5. GCS motor subscore is 6.  Skin: Skin is warm and dry. No  abrasion and no rash noted.  Psychiatric: He has a normal mood and affect. His speech is normal and behavior is normal.  Nursing note and vitals reviewed.   ED Course  Procedures (including critical care time) Labs Review Labs Reviewed  CBC  BASIC METABOLIC PANEL  I-STAT TROPOININ, ED    Imaging Review No results found. I have personally reviewed and evaluated these images and lab results as part of my medical decision-making.   EKG Interpretation   Date/Time:  Tuesday Mar 13 2016 11:46:15 EDT Ventricular Rate:  71 PR  Interval:  193 QRS Duration: 88 QT Interval:  388 QTC Calculation: 422 R Axis:   46 Text Interpretation:  Sinus rhythm No significant change since last  tracing Confirmed by Liboria Putnam  MD, Leanda Padmore ([id number]) on 03/13/2016 11:56:01 AM      MDM   Final diagnoses:  None    Patient given nitroglycerin paste which improved his chest pain. Has been seen by cardiology and will be admitted for further evaluation    Lorre Nick, MD 03/13/16 1541

## 2016-03-13 NOTE — Significant Event (Signed)
Rapid Response Event Note Called per floor RN regarding Pt with low BP, lethargy and heart block. NTG gtt turned off, EKG done, and Pt denying pain. Advised RN to place Pt on [employer] and page Cardiologist STAT while RRT en route.   Overview: Time Called: 2037 Arrival Time: 2041 Event Type: Cardiac  Initial Focused Assessment: Pt found resting in bed, lethargic opens eyes to voice, able to follow simple commands weakly, falls asleep easily. HR 2nd degree heart block rate 50-60 initially, BP 86/60, po2 100% on 2 L , RR 12-17.   Interventions: NS Saline bolus 250 ml started. Pt BP and HR gradually improved within about 15 minutes at which point Cardiologist Dr. Tresa Endo at bedside. Blood pressure rechecked at 140/70 HR 60s 1st degree block.  Pt oriented x3 and more alert. Complains of neck pain. Oral pain medication ordered per MD. Plan to observe for about an hour and transfer if worsening. For MRI tonight if stable. RRT will follow tonight.   Event Summary: Name of Physician Notified: Dr. Nicholaus Bloom at 2040    at    Outcome: Stayed in room and stabalized  Event End Time: 2100  Russell Carter

## 2016-03-13 NOTE — Telephone Encounter (Signed)
Pt calling back saying he hasn't heard back from message last week-explained we called twice-he said he has a minute phone and it ran out of minutes-pls call at [phone]

## 2016-03-13 NOTE — Progress Notes (Signed)
Cardiology X-Cover Note  Notified that Russell Carter was becoming listless with lower blood pressure and brief period of complete heart block. He's been admitted today with chest pain but particularly left arm/shoulder/neck and leg numbness and pain. He was noted to be hypertensive. Initial cardiac biomarkers negative. On exam, vitals BP 86/60 mmHg  Pulse 54  Temp(Src) 98.9 F (37.2 C) (Oral)  Resp 23  Ht 5\' 11"  (1.803 m)  Wt 86.183 kg (190 lb)  BMI 26.51 kg/m2  SpO2 100%reviewed, EKG with complete heart block transiently but sinus 60s on my exam. Blood pressure rechecked at 140/70 and he was oriented x3 and says chest discomfort best today at 1-2/10 and feeling more coherent. He had been on 20 mcg/kg/min NTG. Rapid response had been called. Given transient nature of ECG, improving symptoms and resolved hypotension, we elected to continue on telemetry for now. He mainly was complaining of shoulder pain. We will trial 5/325 hydrocodone/APAP with breakthrough morphine for severe pain as long as blood pressure normal. If symptoms, hypotension or bradycardia recur, then will transfer to ICU. I discussed this plan with the nursing team, rapid response and the patient.   I spent 15 minutes continuously at the beside with the patient and 30 minutes in the direct care of of Russell Carter.   Leeann Must, MD

## 2016-03-13 NOTE — Telephone Encounter (Signed)
943AM pt called back was in the shower, called Anne-no answer-called Selena Batten tried to transfer pt was gone

## 2016-03-13 NOTE — ED Notes (Signed)
Patient transported to X-ray 

## 2016-03-13 NOTE — ED Notes (Signed)
Pt. BIB [facility] for evaluation of mid sternal crushing CP. Pt. States discomfort started 4 days ago but worsened at 1045 this AM. Pt. Reports hx of stroke and MI. States had some radiation to L arm today and sudden weakness to L side around 1045. Pt. Given 324 ASA and 2 Nitro. Pain 4/10 at this time. Pt. AxO x4.

## 2016-03-13 NOTE — Telephone Encounter (Signed)
LMTCB at [phone]

## 2016-03-13 NOTE — Telephone Encounter (Signed)
I attempted to call pt back at [phone] and got a voice mail. I left a message for pt to call back.

## 2016-03-13 NOTE — Telephone Encounter (Signed)
Follow Up   Pt called states that he is running out of minutes request a call back from the nurse.

## 2016-03-13 NOTE — Telephone Encounter (Signed)
Pt states he is having chest pain and lightheadedness now. Pt states the chest pain is similar to chest pain prior to past MI and it is relieved with NTG. Pt does not know his heart rate or BP. Pt states he is having right arm numbness. Pt states he has been told in the past he may have a problem with a disc in his neck.  Pt advised to report to [facility] ED now for further evaluation.

## 2016-03-13 NOTE — H&P (Signed)
Russell Carter is an 60 y.o. male.    Primary Cardiologist:Dr. Johnsie Cancel  PROVIDER NOT IN SYSTEM  Chief Complaint: mid sternal crushing chest pain, lt arm and leg numbness and weakness.  HPI: 60 year old male with hx of non-ST elevation MI secondary to cocaine abuse. Cardiac cath in 2011 showed 50% LAD lesion. He also has history of hypertension, hyperlipidemia, and TIA. last nuc 2014 -no ischemia, low risk.  EF 64%.  Pt had been doing well though he was out of most meds last week-has been taking the HCTZ and losartan.  Beginning Friday he was having Lt arm and leg numbness.  Then yesterday developed midsternal chest pain "crushing" that would come and go.  Today the chest pain was much worse, 10/10 with SOB and diaphoresis.  His lt arm and leg was giving away as well.  Called EMS they gave NTG with some improvement.  Here with NTG paste pain improved.  With exam pt had increased pain from 2/10 to 7/10.  BP is elevated.  Troponin is neg.   EKG SR with no acute changes. Recent dark stools though H/H stable.  He has pain in both hips at times and he does have hx back pain.   Denies any cocaine use, drinks 45 0z of beer per day.   Past Medical History  Diagnosis Date  . Hyperglycemia   . Gout, unspecified   . Hypercholesterolemia   . HTN (hypertension)   . Blood in stool   . Migraines   . Stroke ([place])   . Arthritis   . Pneumonia   . Depression   . Myocardial infarction ([place])     drug cocaine and marijuana at time of mi none  since    . Cocaine abuse     history  . Normal cardiac stress test 07/2013  . Headache   . CHF (congestive heart failure) ([place])   . Diabetes mellitus without complication ([place])   . Renal insufficiency     Past Surgical History  Procedure Laterality Date  . Reapir of left inguinal hernia  02/02/2003    Dr. Georganna Skeans. repair with mesh. Same procedure done again in  05/22/2004   . Rotator cuff repair  2010    left   . Cardiac catheterization   2008    [place]-40%lad  . Reconstruct / stabilize distal ulna    . Inguinal hernia repair  05/12/2012    Procedure: HERNIA REPAIR INGUINAL ADULT;  Surgeon: Zenovia Jarred, MD;  Location: [place];  Service: General;  Laterality: Right;  . Hernia repair      Family History  Problem Relation Age of Onset  . Heart attack Mother     x7. Still alive  . Heart attack Father     deceased b/c of MI  . Heart attack Brother     older, deceased b/c of MI    Social History:  reports that he has never smoked. He has never used smokeless tobacco. He reports that he drinks about 4.2 oz of alcohol per week. He reports that he uses illicit drugs ("Crack" cocaine, Marijuana, and Cocaine).  Allergies:  Allergies  Allergen Reactions  . Shellfish Allergy Anaphylaxis    CURRENT OUTPATIENT MEDICATIONS: Losartan 100 mg daily and HCTZ 25 mg daily.  Out of other meds.  PREVIOUS OUTPATIENT MEDICATIONS; No current facility-administered medications on file prior to encounter.   Current Outpatient Prescriptions on File Prior to Encounter  Medication Sig Dispense Refill  . allopurinol (ZYLOPRIM) 300 MG tablet Take 1 tablet (300 mg total) by mouth daily. 30 tablet 11  . aspirin EC 325 MG tablet Take 325 mg by mouth daily.      Marland Kitchen CALCIUM-VITAMIN D PO Take 1 tablet by mouth daily.    . cyclobenzaprine (FLEXERIL) 10 MG tablet Take 1 tablet (10 mg total) by mouth 3 (three) times daily as needed. For muscle spasms 90 tablet 11  . hydrochlorothiazide (HYDRODIURIL) 25 MG tablet Take 1 tablet (25 mg total) by mouth daily. 30 tablet 11  . ibuprofen (ADVIL,MOTRIN) 800 MG tablet Take 1 tablet (800 mg total) by mouth 3 (three) times daily. (Patient taking differently: Take 800 mg by mouth every 8 (eight) hours as needed for moderate pain. ) 21 tablet 0  . indomethacin (INDOCIN) 25 MG capsule Take 1 capsule (25 mg total) by mouth 3 (three) times daily with meals. 90 capsule 11  . Iron-Vitamins (GERITOL  PO) Take 2 tablets by mouth daily.    Marland Kitchen losartan (COZAAR) 100 MG tablet Take 1 tablet (100 mg total) by mouth daily. 30 tablet 11  . meclizine (ANTIVERT) 25 MG tablet Take 25 mg by mouth 3 (three) times daily as needed. For vertigo    . pravastatin (PRAVACHOL) 40 MG tablet Take 1 tablet (40 mg total) by mouth daily. 30 tablet 11  . diazepam (VALIUM) 5 MG tablet Take 1 tablet (5 mg total) by mouth every 6 (six) hours as needed for muscle spasms. 20 tablet 0  . losartan (COZAAR) 100 MG tablet TAKE ONE TABLET BY MOUTH DAILY 30 tablet 0  . nitroGLYCERIN (NITROSTAT) 0.4 MG SL tablet Place 1 tablet (0.4 mg total) under the tongue every 5 (five) minutes as needed for chest pain. 25 tablet 4  . [DISCONTINUED] famotidine (PEPCID) 20 MG tablet Take 1 tablet (20 mg total) by mouth 2 (two) times daily. 30 tablet 0  . [DISCONTINUED] TACRINE HYDROCHLORIDE PO Take 25 mg by mouth daily.         Results for orders placed or performed during the hospital encounter of 03/13/16 (from the past 48 hour(s))  Basic metabolic panel     Status: Abnormal   Collection Time: 03/13/16 11:52 AM  Result Value Ref Range   Sodium 137 135 - 145 mmol/L   Potassium 3.7 3.5 - 5.1 mmol/L   Chloride 109 101 - 111 mmol/L   CO2 18 (L) 22 - 32 mmol/L   Glucose, Bld 125 (H) 65 - 99 mg/dL   BUN 7 6 - 20 mg/dL   Creatinine, Ser 1.10 0.61 - 1.24 mg/dL   Calcium 9.2 8.9 - 10.3 mg/dL   GFR calc non Af Amer >60 >60 mL/min   GFR calc Af Amer >60 >60 mL/min    Comment: (NOTE) The eGFR has been calculated using the CKD EPI equation. This calculation has not been validated in all clinical situations. eGFR's persistently <60 mL/min signify possible Chronic Kidney Disease.    Anion gap 10 5 - 15  CBC     Status: None   Collection Time: 03/13/16 11:52 AM  Result Value Ref Range   WBC 4.1 4.0 - 10.5 K/uL   RBC 5.14 4.22 - 5.81 MIL/uL   Hemoglobin 15.8 13.0 - 17.0 g/dL   HCT 46.7 39.0 - 52.0 %   MCV 90.9 78.0 - 100.0 fL   MCH 30.7  26.0 - 34.0 pg   MCHC 33.8 30.0 - 36.0 g/dL  RDW 13.7 11.5 - 15.5 %   Platelets 193 150 - 400 K/uL  I-stat troponin, ED     Status: None   Collection Time: 03/13/16 11:55 AM  Result Value Ref Range   Troponin i, poc 0.01 0.00 - 0.08 ng/mL   Comment 3            Comment: Due to the release kinetics of cTnI, a negative result within the first hours of the onset of symptoms does not rule out myocardial infarction with certainty. If myocardial infarction is still suspected, repeat the test at appropriate intervals.    Dg Chest 2 View  03/13/2016  CLINICAL DATA:  Weakness and progressive chest pain last 5 days EXAM: CHEST  2 VIEW COMPARISON:  09/12/2015 FINDINGS: Cardiomediastinal silhouette is stable. No acute infiltrate or pleural effusion. No pulmonary edema. Degenerative changes bilateral shoulders. IMPRESSION: No active cardiopulmonary disease. Electronically Signed   By: Natasha Mead M.D.   On: 03/13/2016 12:40    ROS: General:no colds or fevers, no weight changes Skin:no rashes or ulcers HEENT:no blurred vision, no congestion CV:see HPI PUL:see HPI GI:no diarrhea constipation + melena- dark stools, no indigestion GU:no hematuria, no dysuria MS:+ bil hip pain, ? claudication Neuro:no syncope, no lightheadedness Endo:no diabetes, no thyroid disease  Blood pressure 157/108, pulse 84, temperature 98.5 F (36.9 C), temperature source Oral, resp. rate 15, SpO2 100 %. PE: General:Pleasant affect, but uncomfortable Skin:Warm and dry, brisk capillary refill HEENT:normocephalic, sclera clear, mucus membranes moist Neck:supple, no JVD, no bruits  Heart:S1S2 RRR without murmur, gallup, rub or click Lungs:clear without rales, rhonchi, or wheezes BZX:YDSW, non tender, + BS, do not palpate liver spleen or masses Ext:no lower ext edema, 2+ popliteal pulses, 2+ radial pulses, + neck pain with movement  Neuro:alert and oriented X 3, MAE, follows commands, + facial symmetry, upper ext.  with  equal grip     Assessment/Plan 1. Chest pain- maybe cardiac with known CAD, no acute EKG changes, troponin neg.  - With chest pain and elevated BP will add IV NTG -  - admit to SDU and do serial troponins.  May need morphine - control BP - resume statin - other possibilities are possible cervical disc disease with neck pain arm numbness- may need MRI of neck if rules out for MI nor muscular skeletal. MD to see  2. CAD in [place] -last cath 50 % LAD stenosis in 2011  3. HTN now uncontrolled may be cause of chest pain  4. Hyperlipidemia resume statin recheck labs  Nada Boozer Nurse Practitioner Certified [facility] Pager [phone] or after 5pm or weekends call [phone] 03/13/2016, 2:43 PM    I have seen and examined the patient along with Nada Boozer, NP.  I have reviewed the chart, notes and new data.  I agree with NP's note.  Key new complaints: chest pain is musculoskeletal, not anginal in pattern; he has very prominent symptoms of radicular nerve pain involving the left median nerve distribution and left sided sciatica pain, both also associating paresthesia Key examination changes: severely elevated BP (in pain), loud S4, no overt hypervolemia, clear lungs, + straight leg raise sign on left Key new findings / data: low risk ECG and troponin  PLAN: Admit for BP control. Doubt ischemic cause of pain and I would not pursue ischemic workup unless there is evidence of ECG changes or enzyme leak. Probably needs MRI at least of the cervical spine, maybe also the lumbar spine.  Corky Blumstein,  MD, [facility] [phone] 03/13/2016, 4:56 PM

## 2016-03-14 ENCOUNTER — Encounter (HOSPITAL_COMMUNITY): Payer: Self-pay | Admitting: General Practice

## 2016-03-14 DIAGNOSIS — M4802 Spinal stenosis, cervical region: Secondary | ICD-10-CM

## 2016-03-14 DIAGNOSIS — I442 Atrioventricular block, complete: Secondary | ICD-10-CM | POA: Diagnosis present

## 2016-03-14 DIAGNOSIS — F1910 Other psychoactive substance abuse, uncomplicated: Secondary | ICD-10-CM

## 2016-03-14 HISTORY — DX: Atrioventricular block, complete: I44.2

## 2016-03-14 LAB — HEMOGLOBIN A1C
Hgb A1c MFr Bld: 5.9 % — ABNORMAL HIGH (ref 4.8–5.6)
Mean Plasma Glucose: 123 mg/dL

## 2016-03-14 LAB — TROPONIN I: Troponin I: <0.03 ng/mL (ref <0.031)

## 2016-03-14 NOTE — Progress Notes (Signed)
DAILY PROGRESS NOTE  Subjective:  CHB noted overnight with hypotension - symptomatic. He was on metoprolol, which was held last night, but then given again this morning (not discontinued). Troponins negative. MRI yesterday shows significant multilevel cervical stenosis.  Objective:  Temp:  [98.3 F (36.8 C)-98.9 F (37.2 C)] 98.4 F (36.9 C) (05/24 0700) Pulse Rate:  [45-103] 60 (05/24 0700) Resp:  [11-23] 12 (05/24 0700) BP: (86-198)/(51-122) 132/94 mmHg (05/24 0700) SpO2:  [98 %-100 %] 99 % (05/24 0700) Weight:  [190 lb (86.183 kg)] 190 lb (86.183 kg) (05/23 1725) Weight change:   Intake/Output from previous day: 05/23 0701 - 05/24 0700 In: 480 [P.O.:480] Out: 750 [Urine:750]  Intake/Output from this shift:    Medications: Current Facility-Administered Medications  Medication Dose Route Frequency Provider Last Rate Last Dose  . 0.9 %  sodium chloride infusion   Intravenous Continuous Isaiah Serge, NP 50 mL/hr at 03/13/16 1728    . acetaminophen (TYLENOL) tablet 650 mg  650 mg Oral Q4H PRN Isaiah Serge, NP      . allopurinol (ZYLOPRIM) tablet 300 mg  300 mg Oral Daily Isaiah Serge, NP   300 mg at 03/14/16 0934  . ALPRAZolam Duanne Moron) tablet 0.25 mg  0.25 mg Oral BID PRN Isaiah Serge, NP   0.25 mg at 03/13/16 1812  . aspirin chewable tablet 324 mg  324 mg Oral NOW Isaiah Serge, NP   324 mg at 03/13/16 1757   Or  . aspirin suppository 300 mg  300 mg Rectal NOW Isaiah Serge, NP      . aspirin EC tablet 81 mg  81 mg Oral Daily Isaiah Serge, NP   81 mg at 03/14/16 0934  . cyclobenzaprine (FLEXERIL) tablet 10 mg  10 mg Oral TID Isaiah Serge, NP   10 mg at 03/14/16 0934  . heparin injection 5,000 Units  5,000 Units Subcutaneous Q8H Isaiah Serge, NP   5,000 Units at 03/14/16 3007  . hydrochlorothiazide (HYDRODIURIL) tablet 25 mg  25 mg Oral Daily Isaiah Serge, NP   25 mg at 03/14/16 0934  . HYDROcodone-acetaminophen (NORCO/VICODIN) 5-325 MG per tablet 1-2  tablet  1-2 tablet Oral Q4H PRN Jules Husbands, MD   2 tablet at 03/14/16 [id number]  . losartan (COZAAR) tablet 100 mg  100 mg Oral Daily Isaiah Serge, NP   100 mg at 03/14/16 0934  . morphine 2 MG/ML injection 2 mg  2 mg Intravenous Q2H PRN Isaiah Serge, NP   2 mg at 03/14/16 0042  . nitroGLYCERIN (NITROSTAT) SL tablet 0.4 mg  0.4 mg Sublingual Q5 Min x 3 PRN Isaiah Serge, NP      . nitroGLYCERIN 50 mg in dextrose 5 % 250 mL (0.2 mg/mL) infusion  5 mcg/min Intravenous Titrated Isaiah Serge, NP   Stopped at 03/13/16 2043  . ondansetron (ZOFRAN) injection 4 mg  4 mg Intravenous Q6H PRN Isaiah Serge, NP      . pravastatin (PRAVACHOL) tablet 40 mg  40 mg Oral Daily Isaiah Serge, NP   40 mg at 03/14/16 0934  . zolpidem (AMBIEN) tablet 5 mg  5 mg Oral QHS PRN,MR X 1 Isaiah Serge, NP        Physical Exam: General appearance: alert and mild distress Lungs: clear to auscultation bilaterally Heart: regular rate and rhythm, S1, S2 normal, no murmur, click, rub or gallop Extremities: extremities normal, atraumatic, no cyanosis or  edema Neurologic: Mental status: Alert, oriented, thought content appropriate Motor: MAEW, strength 4/5 LUE, 5/5 RUE  Lab Results: Results for orders placed or performed during the hospital encounter of 03/13/16 (from the past 48 hour(s))  Basic metabolic panel     Status: Abnormal   Collection Time: 03/13/16 11:52 AM  Result Value Ref Range   Sodium 137 135 - 145 mmol/L   Potassium 3.7 3.5 - 5.1 mmol/L   Chloride 109 101 - 111 mmol/L   CO2 18 (L) 22 - 32 mmol/L   Glucose, Bld 125 (H) 65 - 99 mg/dL   BUN 7 6 - 20 mg/dL   Creatinine, Ser 1.10 0.61 - 1.24 mg/dL   Calcium 9.2 8.9 - 10.3 mg/dL   GFR calc non Af Amer >60 >60 mL/min   GFR calc Af Amer >60 >60 mL/min    Comment: (NOTE) The eGFR has been calculated using the CKD EPI equation. This calculation has not been validated in all clinical situations. eGFR's persistently <60 mL/min signify possible Chronic  Kidney Disease.    Anion gap 10 5 - 15  CBC     Status: None   Collection Time: 03/13/16 11:52 AM  Result Value Ref Range   WBC 4.1 4.0 - 10.5 K/uL   RBC 5.14 4.22 - 5.81 MIL/uL   Hemoglobin 15.8 13.0 - 17.0 g/dL   HCT 46.7 39.0 - 52.0 %   MCV 90.9 78.0 - 100.0 fL   MCH 30.7 26.0 - 34.0 pg   MCHC 33.8 30.0 - 36.0 g/dL   RDW 13.7 11.5 - 15.5 %   Platelets 193 150 - 400 K/uL  I-stat troponin, ED     Status: None   Collection Time: 03/13/16 11:55 AM  Result Value Ref Range   Troponin i, poc 0.01 0.00 - 0.08 ng/mL   Comment 3            Comment: Due to the release kinetics of cTnI, a negative result within the first hours of the onset of symptoms does not rule out myocardial infarction with certainty. If myocardial infarction is still suspected, repeat the test at appropriate intervals.   Magnesium     Status: None   Collection Time: 03/13/16  3:46 PM  Result Value Ref Range   Magnesium 2.0 1.7 - 2.4 mg/dL  Hepatic function panel     Status: None   Collection Time: 03/13/16  3:46 PM  Result Value Ref Range   Total Protein 6.9 6.5 - 8.1 g/dL   Albumin 3.6 3.5 - 5.0 g/dL   AST 37 15 - 41 U/L   ALT 37 17 - 63 U/L   Alkaline Phosphatase 38 38 - 126 U/L   Total Bilirubin 0.7 0.3 - 1.2 mg/dL   Bilirubin, Direct 0.2 0.1 - 0.5 mg/dL   Indirect Bilirubin 0.5 0.3 - 0.9 mg/dL  TSH     Status: None   Collection Time: 03/13/16  3:46 PM  Result Value Ref Range   TSH 2.215 0.350 - 4.500 uIU/mL  T4, free     Status: None   Collection Time: 03/13/16  3:46 PM  Result Value Ref Range   Free T4 0.86 0.61 - 1.12 ng/dL  Troponin I     Status: None   Collection Time: 03/13/16  3:46 PM  Result Value Ref Range   Troponin I <0.03 <0.031 ng/mL    Comment:        NO INDICATION OF MYOCARDIAL INJURY.   Troponin I  Status: None   Collection Time: 03/13/16  3:46 PM  Result Value Ref Range   Troponin I <0.03 <0.031 ng/mL    Comment:        NO INDICATION OF MYOCARDIAL INJURY.     Hemoglobin A1c     Status: Abnormal   Collection Time: 03/13/16  3:46 PM  Result Value Ref Range   Hgb A1c MFr Bld 5.9 (H) 4.8 - 5.6 %    Comment: (NOTE)         Pre-diabetes: 5.7 - 6.4         Diabetes: >6.4         Glycemic control for adults with diabetes: <7.0    Mean Plasma Glucose 123 mg/dL    Comment: (NOTE) Performed At: [facility] [place], [place] [id number] Lindon Romp MD [id number]   Sedimentation rate     Status: None   Collection Time: 03/13/16  3:46 PM  Result Value Ref Range   Sed Rate 3 0 - 16 mm/hr  CBC     Status: None   Collection Time: 03/13/16  6:35 PM  Result Value Ref Range   WBC 4.0 4.0 - 10.5 K/uL   RBC 4.86 4.22 - 5.81 MIL/uL   Hemoglobin 15.0 13.0 - 17.0 g/dL   HCT 44.4 39.0 - 52.0 %   MCV 91.4 78.0 - 100.0 fL   MCH 30.9 26.0 - 34.0 pg   MCHC 33.8 30.0 - 36.0 g/dL   RDW 13.7 11.5 - 15.5 %   Platelets 194 150 - 400 K/uL  Creatinine, serum     Status: None   Collection Time: 03/13/16  6:35 PM  Result Value Ref Range   Creatinine, Ser 1.23 0.61 - 1.24 mg/dL   GFR calc non Af Amer >60 >60 mL/min   GFR calc Af Amer >60 >60 mL/min    Comment: (NOTE) The eGFR has been calculated using the CKD EPI equation. This calculation has not been validated in all clinical situations. eGFR's persistently <60 mL/min signify possible Chronic Kidney Disease.   MRSA PCR Screening     Status: None   Collection Time: 03/13/16  6:49 PM  Result Value Ref Range   MRSA by PCR NEGATIVE NEGATIVE    Comment:        The GeneXpert MRSA Assay (FDA approved for NASAL specimens only), is one component of a comprehensive MRSA colonization surveillance program. It is not intended to diagnose MRSA infection nor to guide or monitor treatment for MRSA infections.   Glucose, capillary     Status: Abnormal   Collection Time: 03/13/16  8:41 PM  Result Value Ref Range   Glucose-Capillary 116 (H) 65 - 99 mg/dL  Troponin I     Status: None    Collection Time: 03/14/16  3:36 AM  Result Value Ref Range   Troponin I <0.03 <0.031 ng/mL    Comment:        NO INDICATION OF MYOCARDIAL INJURY.     Imaging: Dg Chest 2 View  03/13/2016  CLINICAL DATA:  Weakness and progressive chest pain last 5 days EXAM: CHEST  2 VIEW COMPARISON:  09/12/2015 FINDINGS: Cardiomediastinal silhouette is stable. No acute infiltrate or pleural effusion. No pulmonary edema. Degenerative changes bilateral shoulders. IMPRESSION: No active cardiopulmonary disease. Electronically Signed   By: Lahoma Crocker M.D.   On: 03/13/2016 12:40   Mr Cervical Spine W Wo Contrast  03/14/2016  CLINICAL DATA:  Initial evaluation for acute left arm  pain. EXAM: MRI CERVICAL SPINE WITHOUT AND WITH CONTRAST TECHNIQUE: Multiplanar and multiecho pulse sequences of the cervical spine, to include the craniocervical junction and cervicothoracic junction, were obtained according to standard protocol without and with intravenous contrast. CONTRAST:  75m MULTIHANCE GADOBENATE DIMEGLUMINE 529 MG/ML IV SOLN COMPARISON:  None. FINDINGS: Visualized portions of the brain and posterior fossa demonstrate no acute abnormality. Craniocervical junction widely patent. Reversal of the normal cervical lordosis with apex at C4-5. Trace anterolisthesis of C3 on C4. No other listhesis or malalignment. Mild degenerative vertebral body height loss at C5 and C6. Vertebral body heights otherwise well maintained. No evidence for acute or chronic fracture. Signal intensity within the vertebral body bone marrow is normal. No focal osseous lesions. No marrow edema. No abnormal enhancement. Signal intensity within the cervical spinal cord is normal. Paraspinous soft tissues demonstrate no acute abnormality. No prevertebral edema. Normal intravascular flow voids present within the vertebral arteries bilaterally. C2-C3: Bilateral uncovertebral spurring with facet arthrosis. Minimal posterior disc bulge. Broad-based disc  osteophyte complex minimally indents the ventral thecal sac without significant canal stenosis. No significant foraminal narrowing at this level. C3-C4: Mild diffuse disc bulge with bilateral uncovertebral spurring. Left greater than right facet arthrosis. Resultant severe left foraminal narrowing with more moderate right foraminal stenosis. Broad-based posterior disc osteophyte indents the ventral thecal sac and abuts the cervical spinal cord with resultant mild to moderate canal stenosis. C4-C5: Degenerative intervertebral disc space narrowing. Prominent anterior endplate osteophytic spurring. Bilateral uncovertebral hypertrophy with mild facet disease. Broad-based posterior disc osteophyte complex indents the ventral thecal sac with resultant mild to moderate canal stenosis. Severe left foraminal narrowing with more moderate right foraminal stenosis. C5-C6: Degenerative intervertebral disc space narrowing with prominent endplate anterior osteophytic spurring. Severe bilateral uncovertebral hypertrophy. Bilateral facet arthrosis. Broad-based posterior disc osteophyte complex flattens and partially effaces the ventral thecal sac with resultant moderate canal stenosis. Superimposed mild ligamentum flavum thickening. There is severe bilateral foraminal stenosis. C6-C7: Bilateral uncovertebral hypertrophy with prominent endplate osteophytic spurring anteriorly. Degenerative intervertebral disc space narrowing with diffuse disc bulge. Disc bulging is slightly eccentric to the right. Broad-based posterior disc osteophyte flattens the ventral thecal sac and results in moderate canal stenosis. Thecal sac measures 9 mm in AP diameter. Severe bilateral foraminal stenosis present. C7-T1: Degenerative intervertebral disc space narrowing with endplate osteophytic spurring anteriorly. Mild diffuse disc bulge. Bilateral uncovertebral spurring. Resultant severe bilateral foraminal stenosis. Posterior disc bulge flattens the  ventral thecal sac with resultant mild canal stenosis. Visualized upper thoracic spine demonstrates no acute abnormality. No significant stenosis identified. IMPRESSION: 1. 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 Resultant mild to moderate diffuse canal narrowing at C3-4 through C6-7, most severe at C5-6 and C6-7. 2. Multifactorial degenerative changes with resultant severe left foraminal stenosis at C3-4, C4-5, C5-6, C6-7, and C7-T1. Severe right foraminal stenosis at C5-6, C6-7, and C7-T1. Electronically Signed   By: BJeannine BogaM.D.   On: 03/14/2016 01:01    Assessment:  Principal Problem:   Chest pain at rest Active Problems:   Mobitz type 1 second degree AV block   Polysubstance abuse   Complete heart block (HCC)   Cervical spinal stenosis   Plan:  1. Etiology of transient CHB is unclear - he was quite symptomatic and hypotensive. This has resolved. B-blocker is discontinued. Cardiac enzymes are negative. Will get neurosurgery input today regarding cervical stenosis and whether surgery is indicated and timing of  that. May need to consider a repeat stress test tomorrow if clearance needed for surgery while inpatient. Continue morphine PRN for pain control.  Time Spent Directly with Patient:  15 minutes  Length of Stay:  LOS: 1 day   Pixie Casino, MD, [facility] Attending Cardiologist [place] 03/14/2016, 10:21 AM

## 2016-03-14 NOTE — Progress Notes (Signed)
At approximately 2030, received alert for patient's HR in the 40s. Upon arrival to patient's room, patient c/o feeling hot and sweaty. Patient extremely diaphoretic. Patient's BP 90s/60s and HR 40s. This RN titrated nitro drip down from 20 mcg/min to 10 mcg/min, checked CBG, and obtained EKG. CBG 116; EKG showed complete heart block. RRT and MD paged. Patient's BP 80s/60s; nitro drip turned off at this time. Patient becoming increasingly lethargic; able to open eyes to voice and state name. Zoll pads placed on patient per RRT.   RRT and MD to bedside; patient's HR and BP stabilizing. Orders for fluid bolus and oral pain medicine received. Orders to hold all HR and BP meds received; patient's bedtime metoprolol not given. Will continue to monitor for changes in patient condition.

## 2016-03-14 NOTE — Consult Note (Signed)
CC:  Chief Complaint  Patient presents with  . Chest Pain    HPI: Russell Carter is a 60 y.o. male with one month of neck and left arm pain since some objects fell on him.  He denies weakness or numbness.  He has been evaluated by the cardiology service and his troponins have been negative.  He had an episode of heart block which they are working up.  PMH: Past Medical History  Diagnosis Date  . Hyperglycemia   . Gout, unspecified   . Hypercholesterolemia   . HTN (hypertension)   . Blood in stool   . Migraines   . Stroke (HCC)   . Arthritis   . Pneumonia   . Depression   . Myocardial infarction (HCC)     drug cocaine and marijuana at time of mi none  since    . Cocaine abuse     history  . Normal cardiac stress test 07/2013  . Headache   . CHF (congestive heart failure) (HCC)   . Diabetes mellitus without complication (HCC)   . Renal insufficiency     PSH: Past Surgical History  Procedure Laterality Date  . Reapir of left inguinal hernia  02/02/2003    Dr. Violeta Gelinas. repair with mesh. Same procedure done again in  05/22/2004   . Rotator cuff repair  2010    left   . Cardiac catheterization  2008    -40%lad  . Reconstruct / stabilize distal ulna    . Inguinal hernia repair  05/12/2012    Procedure: HERNIA REPAIR INGUINAL ADULT;  Surgeon: Liz Malady, MD;  Location: [facility];  Service: General;  Laterality: Right;  . Hernia repair      SH: Social History  Substance Use Topics  . Smoking status: Never Smoker   . Smokeless tobacco: Never Used  . Alcohol Use: 4.2 oz/week    7 Cans of beer per week     Comment: occasional beer    MEDS: Prior to Admission medications   Medication Sig Start Date End Date Taking? Authorizing Provider  allopurinol (ZYLOPRIM) 300 MG tablet Take 1 tablet (300 mg total) by mouth daily. 11/16/14  Yes Wendall Stade, MD  aspirin EC 325 MG tablet Take 325 mg by mouth daily.     Yes Historical Provider, MD   CALCIUM-VITAMIN D PO Take 1 tablet by mouth daily.   Yes Historical Provider, MD  cyclobenzaprine (FLEXERIL) 10 MG tablet Take 1 tablet (10 mg total) by mouth 3 (three) times daily as needed. For muscle spasms 11/16/14  Yes Wendall Stade, MD  hydrochlorothiazide (HYDRODIURIL) 25 MG tablet Take 1 tablet (25 mg total) by mouth daily. 11/16/14  Yes Wendall Stade, MD  ibuprofen (ADVIL,MOTRIN) 800 MG tablet Take 1 tablet (800 mg total) by mouth 3 (three) times daily. Patient taking differently: Take 800 mg by mouth every 8 (eight) hours as needed for moderate pain.  03/16/15  Yes Danelle Berry, PA-C  indomethacin (INDOCIN) 25 MG capsule Take 1 capsule (25 mg total) by mouth 3 (three) times daily with meals. 11/26/13  Yes Wendall Stade, MD  Iron-Vitamins (GERITOL PO) Take 2 tablets by mouth daily.   Yes Historical Provider, MD  losartan (COZAAR) 100 MG tablet Take 1 tablet (100 mg total) by mouth daily. 12/03/14  Yes Wendall Stade, MD  meclizine (ANTIVERT) 25 MG tablet Take 25 mg by mouth 3 (three) times daily as needed. For vertigo   Yes Historical  Provider, MD  pravastatin (PRAVACHOL) 40 MG tablet Take 1 tablet (40 mg total) by mouth daily. 11/16/14  Yes Wendall Stade, MD  diazepam (VALIUM) 5 MG tablet Take 1 tablet (5 mg total) by mouth every 6 (six) hours as needed for muscle spasms. 09/12/15   Leta Baptist, MD  losartan (COZAAR) 100 MG tablet TAKE ONE TABLET BY MOUTH DAILY 12/20/15   Wendall Stade, MD  nitroGLYCERIN (NITROSTAT) 0.4 MG SL tablet Place 1 tablet (0.4 mg total) under the tongue every 5 (five) minutes as needed for chest pain. 11/16/14   Wendall Stade, MD    ALLERGY: Allergies  Allergen Reactions  . Shellfish Allergy Anaphylaxis    ROS: Review of Systems  Constitutional: Negative.   Eyes: Negative.   Respiratory: Negative.   Cardiovascular: Positive for chest pain. Negative for palpitations and orthopnea.  Gastrointestinal: Negative.   Genitourinary: Negative.    Musculoskeletal: Positive for neck pain.  Skin: Negative.   Neurological: Negative.   Psychiatric/Behavioral: Negative.     NEUROLOGIC EXAM: Awake, alert, oriented Memory and concentration grossly intact Speech fluent, appropriate CN grossly intact Motor exam: Upper Extremities Deltoid Bicep Tricep Grip  Right 5/5 5/5 5/5 5/5  Left 5/5 5/5 5/5 5/5   Lower Extremity IP Quad PF DF EHL  Right 5/5 5/5 5/5 5/5 5/5  Left 5/5 5/5 5/5 5/5 5/5   Sensation grossly intact to LT  IMAGING: Severe multilevel spondylosis with bilateral foraminal stenosis C3-T1  IMPRESSION: - 60 y.o. male with cervical spondylosis and radiculopathy, cervical stenosis C3-T1  PLAN: - Follow up with me in clinic after cardiac clearance - Will Likely need C3-T1 laminectomy and fusion

## 2016-03-15 ENCOUNTER — Inpatient Hospital Stay (HOSPITAL_COMMUNITY): Payer: Medicaid Other

## 2016-03-15 DIAGNOSIS — E785 Hyperlipidemia, unspecified: Secondary | ICD-10-CM

## 2016-03-15 DIAGNOSIS — R079 Chest pain, unspecified: Secondary | ICD-10-CM

## 2016-03-15 DIAGNOSIS — I441 Atrioventricular block, second degree: Secondary | ICD-10-CM

## 2016-03-15 DIAGNOSIS — I200 Unstable angina: Secondary | ICD-10-CM | POA: Diagnosis not present

## 2016-03-15 HISTORY — DX: Hyperlipidemia, unspecified: E78.5

## 2016-03-15 LAB — NM MYOCAR MULTI W/SPECT W/WALL MOTION / EF
CSEPHR: 98 %
CSEPPHR: 133 {beats}/min
Exercise duration (min): 12 min
Exercise duration (sec): 0 s
Rest HR: 60 {beats}/min

## 2016-03-15 LAB — HEPARIN LEVEL (UNFRACTIONATED): HEPARIN UNFRACTIONATED: 0.74 [IU]/mL — AB (ref 0.30–0.70)

## 2016-03-15 MED ORDER — SODIUM CHLORIDE 0.9% FLUSH: 3.0000 mL | INTRAVENOUS | Status: DC | PRN

## 2016-03-15 MED ORDER — TECHNETIUM TC 99M TETROFOSMIN IV KIT
10.0000 | PACK | Freq: Once | INTRAVENOUS | Status: AC | PRN
  Administered 2016-03-15: 10 via INTRAVENOUS

## 2016-03-15 MED ORDER — DOBUTAMINE INFUSION FOR EP/ECHO/NUC (1000 MCG/ML)
INTRAVENOUS | Status: AC
  Filled 2016-03-15: qty 250

## 2016-03-15 MED ORDER — TECHNETIUM TC 99M TETROFOSMIN IV KIT
30.0000 | PACK | Freq: Once | INTRAVENOUS | Status: AC | PRN
  Administered 2016-03-15: 30 via INTRAVENOUS

## 2016-03-15 MED ORDER — SODIUM CHLORIDE 0.9% FLUSH
3.0000 mL | Freq: Two times a day (BID) | INTRAVENOUS | Status: DC
Start: 2016-03-15 — End: 2016-03-16
  Administered 2016-03-15 – 2016-03-16 (×2): 3 mL via INTRAVENOUS

## 2016-03-15 MED ORDER — SODIUM CHLORIDE 0.9 % IV SOLN: 250.0000 mL | INTRAVENOUS | Status: DC | PRN

## 2016-03-15 MED ORDER — HEPARIN (PORCINE) IN NACL 100-0.45 UNIT/ML-% IJ SOLN
1100.0000 [IU]/h | INTRAMUSCULAR | Status: DC
  Administered 2016-03-15: 1200 [IU]/h via INTRAVENOUS
  Administered 2016-03-16: 1100 [IU]/h via INTRAVENOUS
  Filled 2016-03-15 (×2): qty 250

## 2016-03-15 MED ORDER — DOBUTAMINE INFUSION FOR EP/ECHO/NUC (1000 MCG/ML)
0.0000 ug/kg/min | INTRAVENOUS | Status: DC
  Administered 2016-03-15: 10 ug/kg/min via INTRAVENOUS

## 2016-03-15 MED ORDER — NITROGLYCERIN 0.4 MG SL SUBL
SUBLINGUAL_TABLET | SUBLINGUAL | Status: AC
  Administered 2016-03-15: 0.4 mg
  Filled 2016-03-15: qty 1

## 2016-03-15 MED ORDER — SODIUM CHLORIDE 0.9 % WEIGHT BASED INFUSION
1.0000 mL/kg/h | INTRAVENOUS | Status: DC
  Administered 2016-03-15: 1 mL/kg/h via INTRAVENOUS

## 2016-03-15 MED ORDER — NITROGLYCERIN 0.4 MG SL SUBL
SUBLINGUAL_TABLET | SUBLINGUAL | Status: AC
  Filled 2016-03-15: qty 1

## 2016-03-15 MED ORDER — HEPARIN BOLUS VIA INFUSION
4000.0000 [IU] | Freq: Once | INTRAVENOUS | Status: AC
  Administered 2016-03-15: 4000 [IU] via INTRAVENOUS
  Filled 2016-03-15: qty 4000

## 2016-03-15 NOTE — Progress Notes (Signed)
While the pt was in Nuc Med waiting to take stress images the pt developed 8/10 SSCP and Lt arm pain with a drop in his B/P to 100 systolic and a drop in his HR to 60 (though reportedly no heart block). This is the second time this has happened this admission. The pt received NTG once his pressure was > 110 and his pain improved. His HR and B/P came up- 119/80- HR 82. He was still having 4/10 pain and a second NTG was given with relief of his symptoms. After discussion with Dr Rennis Golden it was felt he should be transferred to CCU and monitored with plans on definitive cath tomorrow. No stress images were obtained. Complicating the above issues is the fact that he has C3-T1 stenosis and will likely need C3-T1 laminectomy and fusion in the future (the Dobutamine Myoview was done to clear him for that).   Corine Shelter PA-C 03/15/2016 1:44 PM

## 2016-03-15 NOTE — Progress Notes (Signed)
ANTICOAGULATION CONSULT NOTE - Initial Consult  Pharmacy Consult for heparin Indication: chest pain/ACS  Allergies  Allergen Reactions  . Shellfish Allergy Anaphylaxis    Patient Measurements: Height: 5\' 11"  (180.3 cm) Weight: 196 lb 9.6 oz (89.177 kg) IBW/kg (Calculated) : 75.3 Heparin Dosing Weight: 86 kg  Vital Signs: Temp: 98.3 F (36.8 C) (05/25 1932) Temp Source: Oral (05/25 1932) BP: 130/84 mmHg (05/25 1932) Pulse Rate: 99 (05/25 1900)  Labs:  Recent Labs  03/13/16 1152 03/13/16 1546 03/13/16 1835 03/14/16 0336 03/15/16 2000  HGB 15.8  --  15.0  --   --   HCT 46.7  --  44.4  --   --   PLT 193  --  194  --   --   HEPARINUNFRC  --   --   --   --  0.74*  CREATININE 1.10  --  1.23  --   --   TROPONINI  --  <0.03  <0.03  --  <0.03  --     Estimated Creatinine Clearance: 67.2 mL/min (by C-G formula based on Cr of 1.23).   Medical History: Past Medical History  Diagnosis Date  . Hyperglycemia   . Gout, unspecified   . Hypercholesterolemia   . HTN (hypertension)   . Blood in stool   . Migraines   . Stroke (HCC)   . Arthritis   . Pneumonia   . Depression   . Cocaine abuse     history  . Normal cardiac stress test 07/2013  . Headache   . CHF (congestive heart failure) (HCC)   . Diabetes mellitus without complication (HCC)   . Renal insufficiency   . Anginal pain (HCC)   . Myocardial infarction [facility]) 2008    drug cocaine and marijuana at time of mi none  since    . Myocardial infarction (HCC)     "I've had 6-7; after 1st one related to cocaine, I quit drugs then" (03/14/2016)   Medications: . allopurinol  300 mg Oral Daily  . aspirin EC  81 mg Oral Daily  . cyclobenzaprine  10 mg Oral TID  . DOBUTamine      . hydrochlorothiazide  25 mg Oral Daily  . losartan  100 mg Oral Daily  . nitroGLYCERIN      . pravastatin  40 mg Oral Daily  . sodium chloride flush  3 mL Intravenous Q12H    Assessment: 60 yo male with hx CAD admitted with chest pain,  heart block.  Pharmacy asked to begin IV heparin for r/o ACS.  No anticoagulants noted PTA.    First HL was slightly elevated. No issues reported by RN.  Goal of Therapy:  Heparin level 0.3-0.7 units/ml Monitor platelets by anticoagulation protocol: Yes   Plan:  Decrease heparin gtt to 1,100 units/hr Monitor daily HL, CBC, s/s of bleed  Enzo Bi, PharmD, [facility] Clinical Pharmacist Pager [phone] 03/15/2016 9:11 PM

## 2016-03-15 NOTE — Progress Notes (Signed)
DAILY PROGRESS NOTE  Subjective:  No events overnight. No further heart block. Feels like his chest and arm pain are improving. Appreciate neurosurgery evaluation. Will need cardiac risk stratification prior to surgery.  Objective:  Temp:  [98.2 F (36.8 C)-99.2 F (37.3 C)] 98.2 F (36.8 C) (05/25 0852) Pulse Rate:  [68-78] 74 (05/25 0852) Resp:  [12-15] 12 (05/25 0852) BP: (117-141)/(79-93) 117/81 mmHg (05/25 0852) SpO2:  [98 %-100 %] 98 % (05/25 0852) Weight:  [196 lb 9.6 oz (89.177 kg)] 196 lb 9.6 oz (89.177 kg) (05/25 0700) Weight change: 6 lb 9.6 oz (2.994 kg)  Intake/Output from previous day: 05/24 0701 - 05/25 0700 In: 890 [P.O.:890] Out: 200 [Urine:200]  Intake/Output from this shift:    Medications: Current Facility-Administered Medications  Medication Dose Route Frequency Provider Last Rate Last Dose  . 0.9 %  sodium chloride infusion   Intravenous Continuous Isaiah Serge, NP 50 mL/hr at 03/13/16 1728    . acetaminophen (TYLENOL) tablet 650 mg  650 mg Oral Q4H PRN Isaiah Serge, NP      . allopurinol (ZYLOPRIM) tablet 300 mg  300 mg Oral Daily Isaiah Serge, NP   300 mg at 03/15/16 0741  . ALPRAZolam Duanne Moron) tablet 0.25 mg  0.25 mg Oral BID PRN Isaiah Serge, NP   0.25 mg at 03/13/16 1812  . aspirin EC tablet 81 mg  81 mg Oral Daily Isaiah Serge, NP   81 mg at 03/15/16 4259  . cyclobenzaprine (FLEXERIL) tablet 10 mg  10 mg Oral TID Isaiah Serge, NP   10 mg at 03/15/16 0742  . heparin injection 5,000 Units  5,000 Units Subcutaneous Q8H Isaiah Serge, NP   5,000 Units at 03/15/16 0550  . hydrochlorothiazide (HYDRODIURIL) tablet 25 mg  25 mg Oral Daily Isaiah Serge, NP   25 mg at 03/15/16 0741  . HYDROcodone-acetaminophen (NORCO/VICODIN) 5-325 MG per tablet 1-2 tablet  1-2 tablet Oral Q4H PRN Jules Husbands, MD   2 tablet at 03/15/16 0605  . losartan (COZAAR) tablet 100 mg  100 mg Oral Daily Isaiah Serge, NP   100 mg at 03/15/16 0741  . morphine 2 MG/ML  injection 2 mg  2 mg Intravenous Q2H PRN Isaiah Serge, NP   2 mg at 03/14/16 0042  . nitroGLYCERIN (NITROSTAT) SL tablet 0.4 mg  0.4 mg Sublingual Q5 Min x 3 PRN Isaiah Serge, NP      . nitroGLYCERIN 50 mg in dextrose 5 % 250 mL (0.2 mg/mL) infusion  5 mcg/min Intravenous Titrated Isaiah Serge, NP   Stopped at 03/13/16 2043  . ondansetron (ZOFRAN) injection 4 mg  4 mg Intravenous Q6H PRN Isaiah Serge, NP      . pravastatin (PRAVACHOL) tablet 40 mg  40 mg Oral Daily Isaiah Serge, NP   40 mg at 03/15/16 0741  . zolpidem (AMBIEN) tablet 5 mg  5 mg Oral QHS PRN,MR X 1 Isaiah Serge, NP        Physical Exam: General appearance: alert and no distress Lungs: clear to auscultation bilaterally Heart: regular rate and rhythm Extremities: extremities normal, atraumatic, no cyanosis or edema  Lab Results: Results for orders placed or performed during the hospital encounter of 03/13/16 (from the past 48 hour(s))  Basic metabolic panel     Status: Abnormal   Collection Time: 03/13/16 11:52 AM  Result Value Ref Range   Sodium 137 135 - 145 mmol/L  Potassium 3.7 3.5 - 5.1 mmol/L   Chloride 109 101 - 111 mmol/L   CO2 18 (L) 22 - 32 mmol/L   Glucose, Bld 125 (H) 65 - 99 mg/dL   BUN 7 6 - 20 mg/dL   Creatinine, Ser 1.10 0.61 - 1.24 mg/dL   Calcium 9.2 8.9 - 10.3 mg/dL   GFR calc non Af Amer >60 >60 mL/min   GFR calc Af Amer >60 >60 mL/min    Comment: (NOTE) The eGFR has been calculated using the CKD EPI equation. This calculation has not been validated in all clinical situations. eGFR's persistently <60 mL/min signify possible Chronic Kidney Disease.    Anion gap 10 5 - 15  CBC     Status: None   Collection Time: 03/13/16 11:52 AM  Result Value Ref Range   WBC 4.1 4.0 - 10.5 K/uL   RBC 5.14 4.22 - 5.81 MIL/uL   Hemoglobin 15.8 13.0 - 17.0 g/dL   HCT 46.7 39.0 - 52.0 %   MCV 90.9 78.0 - 100.0 fL   MCH 30.7 26.0 - 34.0 pg   MCHC 33.8 30.0 - 36.0 g/dL   RDW 13.7 11.5 - 15.5 %    Platelets 193 150 - 400 K/uL  I-stat troponin, ED     Status: None   Collection Time: 03/13/16 11:55 AM  Result Value Ref Range   Troponin i, poc 0.01 0.00 - 0.08 ng/mL   Comment 3            Comment: Due to the release kinetics of cTnI, a negative result within the first hours of the onset of symptoms does not rule out myocardial infarction with certainty. If myocardial infarction is still suspected, repeat the test at appropriate intervals.   Magnesium     Status: None   Collection Time: 03/13/16  3:46 PM  Result Value Ref Range   Magnesium 2.0 1.7 - 2.4 mg/dL  Hepatic function panel     Status: None   Collection Time: 03/13/16  3:46 PM  Result Value Ref Range   Total Protein 6.9 6.5 - 8.1 g/dL   Albumin 3.6 3.5 - 5.0 g/dL   AST 37 15 - 41 U/L   ALT 37 17 - 63 U/L   Alkaline Phosphatase 38 38 - 126 U/L   Total Bilirubin 0.7 0.3 - 1.2 mg/dL   Bilirubin, Direct 0.2 0.1 - 0.5 mg/dL   Indirect Bilirubin 0.5 0.3 - 0.9 mg/dL  TSH     Status: None   Collection Time: 03/13/16  3:46 PM  Result Value Ref Range   TSH 2.215 0.350 - 4.500 uIU/mL  T4, free     Status: None   Collection Time: 03/13/16  3:46 PM  Result Value Ref Range   Free T4 0.86 0.61 - 1.12 ng/dL  Troponin I     Status: None   Collection Time: 03/13/16  3:46 PM  Result Value Ref Range   Troponin I <0.03 <0.031 ng/mL    Comment:        NO INDICATION OF MYOCARDIAL INJURY.   Troponin I     Status: None   Collection Time: 03/13/16  3:46 PM  Result Value Ref Range   Troponin I <0.03 <0.031 ng/mL    Comment:        NO INDICATION OF MYOCARDIAL INJURY.   Hemoglobin A1c     Status: Abnormal   Collection Time: 03/13/16  3:46 PM  Result Value Ref Range   Hgb A1c  MFr Bld 5.9 (H) 4.8 - 5.6 %    Comment: (NOTE)         Pre-diabetes: 5.7 - 6.4         Diabetes: >6.4         Glycemic control for adults with diabetes: <7.0    Mean Plasma Glucose 123 mg/dL    Comment: (NOTE) Performed At: [facility] [place], [place] [id number] Lindon Romp MD [id number]   Sedimentation rate     Status: None   Collection Time: 03/13/16  3:46 PM  Result Value Ref Range   Sed Rate 3 0 - 16 mm/hr  CBC     Status: None   Collection Time: 03/13/16  6:35 PM  Result Value Ref Range   WBC 4.0 4.0 - 10.5 K/uL   RBC 4.86 4.22 - 5.81 MIL/uL   Hemoglobin 15.0 13.0 - 17.0 g/dL   HCT 44.4 39.0 - 52.0 %   MCV 91.4 78.0 - 100.0 fL   MCH 30.9 26.0 - 34.0 pg   MCHC 33.8 30.0 - 36.0 g/dL   RDW 13.7 11.5 - 15.5 %   Platelets 194 150 - 400 K/uL  Creatinine, serum     Status: None   Collection Time: 03/13/16  6:35 PM  Result Value Ref Range   Creatinine, Ser 1.23 0.61 - 1.24 mg/dL   GFR calc non Af Amer >60 >60 mL/min   GFR calc Af Amer >60 >60 mL/min    Comment: (NOTE) The eGFR has been calculated using the CKD EPI equation. This calculation has not been validated in all clinical situations. eGFR's persistently <60 mL/min signify possible Chronic Kidney Disease.   MRSA PCR Screening     Status: None   Collection Time: 03/13/16  6:49 PM  Result Value Ref Range   MRSA by PCR NEGATIVE NEGATIVE    Comment:        The GeneXpert MRSA Assay (FDA approved for NASAL specimens only), is one component of a comprehensive MRSA colonization surveillance program. It is not intended to diagnose MRSA infection nor to guide or monitor treatment for MRSA infections.   Glucose, capillary     Status: Abnormal   Collection Time: 03/13/16  8:41 PM  Result Value Ref Range   Glucose-Capillary 116 (H) 65 - 99 mg/dL  Troponin I     Status: None   Collection Time: 03/14/16  3:36 AM  Result Value Ref Range   Troponin I <0.03 <0.031 ng/mL    Comment:        NO INDICATION OF MYOCARDIAL INJURY.     Imaging: Dg Chest 2 View  03/13/2016  CLINICAL DATA:  Weakness and progressive chest pain last 5 days EXAM: CHEST  2 VIEW COMPARISON:  09/12/2015 FINDINGS: Cardiomediastinal silhouette is  stable. No acute infiltrate or pleural effusion. No pulmonary edema. Degenerative changes bilateral shoulders. IMPRESSION: No active cardiopulmonary disease. Electronically Signed   By: Lahoma Crocker M.D.   On: 03/13/2016 12:40   Mr Cervical Spine W Wo Contrast  03/14/2016  CLINICAL DATA:  Initial evaluation for acute left arm pain. EXAM: MRI CERVICAL SPINE WITHOUT AND WITH CONTRAST TECHNIQUE: Multiplanar and multiecho pulse sequences of the cervical spine, to include the craniocervical junction and cervicothoracic junction, were obtained according to standard protocol without and with intravenous contrast. CONTRAST:  24m MULTIHANCE GADOBENATE DIMEGLUMINE 529 MG/ML IV SOLN COMPARISON:  None. FINDINGS: Visualized portions of the brain and posterior fossa demonstrate no acute abnormality. Craniocervical junction  widely patent. Reversal of the normal cervical lordosis with apex at C4-5. Trace anterolisthesis of C3 on C4. No other listhesis or malalignment. Mild degenerative vertebral body height loss at C5 and C6. Vertebral body heights otherwise well maintained. No evidence for acute or chronic fracture. Signal intensity within the vertebral body bone marrow is normal. No focal osseous lesions. No marrow edema. No abnormal enhancement. Signal intensity within the cervical spinal cord is normal. Paraspinous soft tissues demonstrate no acute abnormality. No prevertebral edema. Normal intravascular flow voids present within the vertebral arteries bilaterally. C2-C3: Bilateral uncovertebral spurring with facet arthrosis. Minimal posterior disc bulge. Broad-based disc osteophyte complex minimally indents the ventral thecal sac without significant canal stenosis. No significant foraminal narrowing at this level. C3-C4: Mild diffuse disc bulge with bilateral uncovertebral spurring. Left greater than right facet arthrosis. Resultant severe left foraminal narrowing with more moderate right foraminal stenosis. Broad-based  posterior disc osteophyte indents the ventral thecal sac and abuts the cervical spinal cord with resultant mild to moderate canal stenosis. C4-C5: Degenerative intervertebral disc space narrowing. Prominent anterior endplate osteophytic spurring. Bilateral uncovertebral hypertrophy with mild facet disease. Broad-based posterior disc osteophyte complex indents the ventral thecal sac with resultant mild to moderate canal stenosis. Severe left foraminal narrowing with more moderate right foraminal stenosis. C5-C6: Degenerative intervertebral disc space narrowing with prominent endplate anterior osteophytic spurring. Severe bilateral uncovertebral hypertrophy. Bilateral facet arthrosis. Broad-based posterior disc osteophyte complex flattens and partially effaces the ventral thecal sac with resultant moderate canal stenosis. Superimposed mild ligamentum flavum thickening. There is severe bilateral foraminal stenosis. C6-C7: Bilateral uncovertebral hypertrophy with prominent endplate osteophytic spurring anteriorly. Degenerative intervertebral disc space narrowing with diffuse disc bulge. Disc bulging is slightly eccentric to the right. Broad-based posterior disc osteophyte flattens the ventral thecal sac and results in moderate canal stenosis. Thecal sac measures 9 mm in AP diameter. Severe bilateral foraminal stenosis present. C7-T1: Degenerative intervertebral disc space narrowing with endplate osteophytic spurring anteriorly. Mild diffuse disc bulge. Bilateral uncovertebral spurring. Resultant severe bilateral foraminal stenosis. Posterior disc bulge flattens the ventral thecal sac with resultant mild canal stenosis. Visualized upper thoracic spine demonstrates no acute abnormality. No significant stenosis identified. IMPRESSION: 1. 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 Resultant mild to moderate diffuse canal  narrowing at C3-4 through C6-7, most severe at C5-6 and C6-7. 2. Multifactorial degenerative changes with resultant severe left foraminal stenosis at C3-4, C4-5, C5-6, C6-7, and C7-T1. Severe right foraminal stenosis at C5-6, C6-7, and C7-T1. Electronically Signed   By: Jeannine Boga M.D.   On: 03/14/2016 01:01    Assessment:  1. Principal Problem: 2.   Chest pain at rest 3. Active Problems: 4.   Mobitz type 1 second degree AV block 5.   Polysubstance abuse 6.   Complete heart block (HCC) 7.   Cervical spinal stenosis 8.   Plan:  1. Will risk stratify for chest pain today with dobutamine myoview (lexiscan is contraindicated due to recent complete heart block). Patient was reminded to remain NPO and did not eat his tray which was in the room. If myoview is low risk, then can be discharged this afternoon with a 30 day outpatient monitor. Keep off of b-blocker and AVN blocking agents. Follow-up with Dr. Johnsie Cancel and outpatient follow-up with Dr. Cyndy Freeze with neurosurgery.  Time Spent Directly with Patient:  15 minutes  Length of Stay:  LOS: 2 days   Pixie Casino, MD, [facility]  Attending Cardiologist [place] 03/15/2016, 9:59 AM

## 2016-03-15 NOTE — Progress Notes (Signed)
ANTICOAGULATION CONSULT NOTE - Initial Consult  Pharmacy Consult for heparin Indication: chest pain/ACS  Allergies  Allergen Reactions  . Shellfish Allergy Anaphylaxis    Patient Measurements: Height: 5\' 11"  (180.3 cm) Weight: 196 lb 9.6 oz (89.177 kg) IBW/kg (Calculated) : 75.3 Heparin Dosing Weight: 86 kg  Vital Signs: Temp: 98.2 F (36.8 C) (05/25 0852) Temp Source: Oral (05/25 0852) BP: 118/74 mmHg (05/25 1331) Pulse Rate: 83 (05/25 1331)  Labs:  Recent Labs  03/13/16 1152 03/13/16 1546 03/13/16 1835 03/14/16 0336  HGB 15.8  --  15.0  --   HCT 46.7  --  44.4  --   PLT 193  --  194  --   CREATININE 1.10  --  1.23  --   TROPONINI  --  <0.03  <0.03  --  <0.03    Estimated Creatinine Clearance: 67.2 mL/min (by C-G formula based on Cr of 1.23).   Medical History: Past Medical History  Diagnosis Date  . Hyperglycemia   . Gout, unspecified   . Hypercholesterolemia   . HTN (hypertension)   . Blood in stool   . Migraines   . Stroke (HCC)   . Arthritis   . Pneumonia   . Depression   . Cocaine abuse     history  . Normal cardiac stress test 07/2013  . Headache   . CHF (congestive heart failure) (HCC)   . Diabetes mellitus without complication (HCC)   . Renal insufficiency   . Anginal pain (HCC)   . Myocardial infarction [facility]) 2008    drug cocaine and marijuana at time of mi none  since    . Myocardial infarction (HCC)     "I've had 6-7; after 1st one related to cocaine, I quit drugs then" (03/14/2016)   Medications: . allopurinol  300 mg Oral Daily  . aspirin EC  81 mg Oral Daily  . cyclobenzaprine  10 mg Oral TID  . DOBUTamine      . heparin  4,000 Units Intravenous Once  . hydrochlorothiazide  25 mg Oral Daily  . losartan  100 mg Oral Daily  . nitroGLYCERIN      . pravastatin  40 mg Oral Daily    Assessment: 61 yo male with hx CAD admitted with chest pain, heart block.  Pharmacy asked to begin IV heparin for r/o ACS.  No anticoagulants noted  PTA.  Spoke with patient, no hx bleeding.    Goal of Therapy:  Heparin level 0.3-0.7 units/ml Monitor platelets by anticoagulation protocol: Yes   Plan:  1. Start IV heparin with bolus of 4000 units x 1. 2. Then start heparin gtt at 1200 units/hr. 3. Check heparin level 6 hrs after gtt starts. 4. Daily heparin level and CBC. 5. F/u plans for heparin after cath on 5/26.  Tad Moore, BCPS  Clinical Pharmacist Pager [phone]  03/15/2016 2:04 PM

## 2016-03-16 ENCOUNTER — Encounter (HOSPITAL_COMMUNITY): Payer: Self-pay | Admitting: Cardiology

## 2016-03-16 ENCOUNTER — Encounter (HOSPITAL_COMMUNITY)
Admission: EM | Disposition: A | Discharge status: Home / Self Care | Payer: Self-pay | Source: Home / Self Care | Attending: Cardiovascular Disease

## 2016-03-16 DIAGNOSIS — I200 Unstable angina: Secondary | ICD-10-CM

## 2016-03-16 DIAGNOSIS — I25110 Atherosclerotic heart disease of native coronary artery with unstable angina pectoris: Principal | ICD-10-CM

## 2016-03-16 HISTORY — PX: CARDIAC CATHETERIZATION: SHX172

## 2016-03-16 LAB — CBC
HCT: 39.6 % (ref 39.0–52.0)
HEMOGLOBIN: 12.8 g/dL — AB (ref 13.0–17.0)
MCH: 29.9 pg (ref 26.0–34.0)
MCHC: 32.3 g/dL (ref 30.0–36.0)
MCV: 92.5 fL (ref 78.0–100.0)
PLATELETS: 151 10*3/uL (ref 150–400)
RBC: 4.28 MIL/uL (ref 4.22–5.81)
RDW: 13.7 % (ref 11.5–15.5)
WBC: 4.4 10*3/uL (ref 4.0–10.5)

## 2016-03-16 LAB — PROTIME-INR
INR: 1.11 (ref 0.00–1.49)
PROTHROMBIN TIME: 14.5 s (ref 11.6–15.2)

## 2016-03-16 LAB — POCT ACTIVATED CLOTTING TIME: Activated Clotting Time: 250 seconds

## 2016-03-16 LAB — GLUCOSE, CAPILLARY: GLUCOSE-CAPILLARY: 103 mg/dL — AB (ref 65–99)

## 2016-03-16 LAB — HEPARIN LEVEL (UNFRACTIONATED): Heparin Unfractionated: 0.58 IU/mL (ref 0.30–0.70)

## 2016-03-16 SURGERY — LEFT HEART CATH AND CORONARY ANGIOGRAPHY

## 2016-03-16 MED ORDER — HEPARIN (PORCINE) IN NACL 2-0.9 UNIT/ML-% IJ SOLN
INTRAMUSCULAR | Status: DC | PRN
  Administered 2016-03-16: 1500 mL

## 2016-03-16 MED ORDER — HEPARIN SODIUM (PORCINE) 1000 UNIT/ML IJ SOLN
INTRAMUSCULAR | Status: DC | PRN
  Administered 2016-03-16: 1500 [IU] via INTRAVENOUS
  Administered 2016-03-16: 2500 [IU] via INTRAVENOUS
  Administered 2016-03-16: 4000 [IU] via INTRAVENOUS

## 2016-03-16 MED ORDER — NITROGLYCERIN 1 MG/10 ML FOR IR/CATH LAB
INTRA_ARTERIAL | Status: DC | PRN
  Administered 2016-03-16 (×2): 200 ug via INTRACORONARY

## 2016-03-16 MED ORDER — MIDAZOLAM HCL 2 MG/2ML IJ SOLN
INTRAMUSCULAR | Status: DC | PRN
  Administered 2016-03-16: 1 mg via INTRAVENOUS

## 2016-03-16 MED ORDER — TICAGRELOR 90 MG PO TABS
ORAL_TABLET | ORAL | Status: AC
  Filled 2016-03-16: qty 2

## 2016-03-16 MED ORDER — SODIUM CHLORIDE 0.9 % WEIGHT BASED INFUSION: 3.0000 mL/kg/h | INTRAVENOUS | Status: AC

## 2016-03-16 MED ORDER — VERAPAMIL HCL 2.5 MG/ML IV SOLN
INTRAVENOUS | Status: DC | PRN
  Administered 2016-03-16: 10 mL via INTRA_ARTERIAL

## 2016-03-16 MED ORDER — LIDOCAINE HCL (PF) 1 % IJ SOLN
INTRAMUSCULAR | Status: AC
  Filled 2016-03-16: qty 30

## 2016-03-16 MED ORDER — TICAGRELOR 90 MG PO TABS
ORAL_TABLET | ORAL | Status: DC | PRN
  Administered 2016-03-16: 180 mg via ORAL

## 2016-03-16 MED ORDER — SODIUM CHLORIDE 0.9% FLUSH: 3.0000 mL | Freq: Two times a day (BID) | INTRAVENOUS | Status: DC

## 2016-03-16 MED ORDER — TICAGRELOR 90 MG PO TABS
90.0000 mg | ORAL_TABLET | Freq: Two times a day (BID) | ORAL | Status: DC
  Administered 2016-03-16 – 2016-03-17 (×2): 90 mg via ORAL
  Filled 2016-03-16 (×2): qty 1

## 2016-03-16 MED ORDER — IOPAMIDOL (ISOVUE-370) INJECTION 76%
INTRAVENOUS | Status: DC | PRN
  Administered 2016-03-16: 170 mL via INTRA_ARTERIAL

## 2016-03-16 MED ORDER — ANGIOPLASTY BOOK
Freq: Once | Status: AC
Start: 2016-03-16 — End: 2016-03-16
  Administered 2016-03-16: 22:00:00
  Filled 2016-03-16: qty 1

## 2016-03-16 MED ORDER — SODIUM CHLORIDE 0.9 % IV SOLN: 250.0000 mL | INTRAVENOUS | Status: DC | PRN

## 2016-03-16 MED ORDER — SODIUM CHLORIDE 0.9% FLUSH: 3.0000 mL | INTRAVENOUS | Status: DC | PRN

## 2016-03-16 MED ORDER — HEPARIN (PORCINE) IN NACL 2-0.9 UNIT/ML-% IJ SOLN
INTRAMUSCULAR | Status: AC
  Filled 2016-03-16: qty 1500

## 2016-03-16 MED ORDER — IOPAMIDOL (ISOVUE-370) INJECTION 76%
INTRAVENOUS | Status: AC
  Filled 2016-03-16: qty 200

## 2016-03-16 MED ORDER — HEPARIN SODIUM (PORCINE) 1000 UNIT/ML IJ SOLN
INTRAMUSCULAR | Status: AC
  Filled 2016-03-16: qty 1

## 2016-03-16 MED ORDER — MIDAZOLAM HCL 2 MG/2ML IJ SOLN
INTRAMUSCULAR | Status: AC
  Filled 2016-03-16: qty 2

## 2016-03-16 MED ORDER — FENTANYL CITRATE (PF) 100 MCG/2ML IJ SOLN
INTRAMUSCULAR | Status: AC
  Filled 2016-03-16: qty 2

## 2016-03-16 MED ORDER — IOPAMIDOL (ISOVUE-370) INJECTION 76%
INTRAVENOUS | Status: AC
  Filled 2016-03-16: qty 100

## 2016-03-16 MED ORDER — FENTANYL CITRATE (PF) 100 MCG/2ML IJ SOLN
INTRAMUSCULAR | Status: DC | PRN
  Administered 2016-03-16: 50 ug via INTRAVENOUS

## 2016-03-16 MED ORDER — HYDRALAZINE HCL 20 MG/ML IJ SOLN: 10.0000 mg | Freq: Four times a day (QID) | INTRAMUSCULAR | Status: DC | PRN

## 2016-03-16 MED ORDER — VERAPAMIL HCL 2.5 MG/ML IV SOLN
INTRAVENOUS | Status: AC
  Filled 2016-03-16: qty 2

## 2016-03-16 MED ORDER — LIDOCAINE HCL (PF) 1 % IJ SOLN
INTRAMUSCULAR | Status: DC | PRN
  Administered 2016-03-16: 3 mL via INTRADERMAL

## 2016-03-16 SURGICAL SUPPLY — 17 items
BALLN EMERGE MR 2.0X15 (BALLOONS) ×3
BALLN ~~LOC~~ EUPHORA RX 2.75X15 (BALLOONS) ×3
BALLOON EMERGE MR 2.0X15 (BALLOONS) IMPLANT
BALLOON ~~LOC~~ EUPHORA RX 2.75X15 (BALLOONS) IMPLANT
CATH INFINITI 5FR ANG PIGTAIL (CATHETERS) ×2 IMPLANT
CATH OPTITORQUE TIG 4.0 5F (CATHETERS) ×2 IMPLANT
CATH VISTA GUIDE 6FR JR4 (CATHETERS) ×2 IMPLANT
DEVICE RAD COMP TR BAND LRG (VASCULAR PRODUCTS) ×2 IMPLANT
GLIDESHEATH SLEND A-KIT 6F 22G (SHEATH) ×2 IMPLANT
KIT ENCORE 26 ADVANTAGE (KITS) ×2 IMPLANT
KIT HEART LEFT (KITS) ×3 IMPLANT
PACK CARDIAC CATHETERIZATION (CUSTOM PROCEDURE TRAY) ×3 IMPLANT
STENT PROMUS PREM MR 2.5X20 (Permanent Stent) ×2 IMPLANT
TRANSDUCER W/STOPCOCK (MISCELLANEOUS) ×3 IMPLANT
TUBING CIL FLEX 10 FLL-RA (TUBING) ×3 IMPLANT
WIRE RUNTHROUGH .014X180CM (WIRE) ×2 IMPLANT
WIRE SAFE-T 1.5MM-J .035X260CM (WIRE) ×2 IMPLANT

## 2016-03-16 NOTE — Progress Notes (Signed)
TR BAND REMOVAL  LOCATION:    right radial  DEFLATED PER PROTOCOL:    Yes.    TIME BAND OFF / DRESSING APPLIED:    1730   SITE UPON ARRIVAL:    Level 0  SITE AFTER BAND REMOVAL:    Level 0  CIRCULATION SENSATION AND MOVEMENT:    Within Normal Limits   Yes.    COMMENTS:  TOLERATED PROCEDURE WELL

## 2016-03-16 NOTE — Care Management Note (Signed)
Case Management Note  Patient Details  Name: Russell Carter MRN: [id number] Date of Birth: May 16, 1956  Subjective/Objective:   Patient lives with fiance, Merdis Delay she helps him at home.  He states he does not need HH services , he uses a cane at home.  For transportation he states he uses a cab or bus pass.  NCM informed him that CSW will bring bus pass to home tomorrow.  He will be on [place] and he goes to [place] at [employer] and they do have in stock.                  Action/Plan:   Expected Discharge Date:                  Expected Discharge Plan:  Home/Self Care  In-House Referral:     Discharge planning Services  CM Consult  Post Acute Care Choice:    Choice offered to:     DME Arranged:    DME Agency:     HH Arranged:    HH Agency:     Status of Service:  Completed, signed off  Medicare Important Message Given:    Date Medicare IM Given:    Medicare IM give by:    Date Additional Medicare IM Given:    Additional Medicare Important Message give by:     If discussed at Long Length of Stay Meetings, dates discussed:    Additional Comments:  Leone Haven, RN 03/16/2016, 4:01 PM

## 2016-03-16 NOTE — Progress Notes (Addendum)
Physician notified: Caryl Ada, [place] At: 1539  Regarding: 6c06 Sabbagh. [place] placed PCI to [facility]. BP 181/93(125) no PRNs. Gave HCTZ. No CP.   Orders: placed order for PRN hydralazine. Do not give until 1715 (two hours after HCTZ given).

## 2016-03-16 NOTE — Progress Notes (Signed)
ANTICOAGULATION CONSULT NOTE - Initial Consult  Pharmacy Consult for heparin Indication: chest pain/ACS  Allergies  Allergen Reactions  . Shellfish Allergy Anaphylaxis    Patient Measurements: Height: 5\' 11"  (180.3 cm) Weight: 196 lb 10.4 oz (89.2 kg) IBW/kg (Calculated) : 75.3 Heparin Dosing Weight: 86 kg  Vital Signs: Temp: 97.9 F (36.6 C) (05/26 0400) Temp Source: Oral (05/26 0400) BP: 137/86 mmHg (05/26 0600) Pulse Rate: 79 (05/26 0600)  Labs:  Recent Labs  03/13/16 1152 03/13/16 1546 03/13/16 1835 03/14/16 0336 03/15/16 2000 03/16/16 0412  HGB 15.8  --  15.0  --   --  12.8*  HCT 46.7  --  44.4  --   --  39.6  PLT 193  --  194  --   --  151  LABPROT  --   --   --   --   --  14.5  INR  --   --   --   --   --  1.11  HEPARINUNFRC  --   --   --   --  0.74* 0.58  CREATININE 1.10  --  1.23  --   --   --   TROPONINI  --  <0.03  <0.03  --  <0.03  --   --     Estimated Creatinine Clearance: 67.2 mL/min (by C-G formula based on Cr of 1.23).   Medical History: Past Medical History  Diagnosis Date  . Hyperglycemia   . Gout, unspecified   . Hypercholesterolemia   . HTN (hypertension)   . Blood in stool   . Migraines   . Stroke (HCC)   . Arthritis   . Pneumonia   . Depression   . Cocaine abuse     history  . Normal cardiac stress test 07/2013  . Headache   . CHF (congestive heart failure) (HCC)   . Diabetes mellitus without complication (HCC)   . Renal insufficiency   . Anginal pain (HCC)   . Myocardial infarction [facility]) 2008    drug cocaine and marijuana at time of mi none  since    . Myocardial infarction (HCC)     "I've had 6-7; after 1st one related to cocaine, I quit drugs then" (03/14/2016)   Medications: . allopurinol  300 mg Oral Daily  . aspirin EC  81 mg Oral Daily  . cyclobenzaprine  10 mg Oral TID  . hydrochlorothiazide  25 mg Oral Daily  . losartan  100 mg Oral Daily  . pravastatin  40 mg Oral Daily  . sodium chloride flush  3 mL  Intravenous Q12H    Assessment: 60 yo male with hx CAD admitted with chest pain, heart block.  Pharmacy began IV heparin for r/o ACS.  No anticoagulants noted PTA.    HL therapeutic at 1100 units/hr.  Goal of Therapy:  Heparin level 0.3-0.7 units/ml Monitor platelets by anticoagulation protocol: Yes   Plan:  Continue heparin gtt at 1,100 units/hr Monitor daily HL, CBC, s/s of bleed  Greggory Stallion, PharmD Clinical Pharmacy Resident Pager # [phone] 03/16/2016 7:31 AM

## 2016-03-16 NOTE — H&P (View-Only) (Signed)
  DAILY PROGRESS NOTE  Subjective:  Events noted yesterday - transferred to CCU for closer monitoring. He had no further heart block overnight. Sleepy today from pain medicine. Stress test was incomplete as he did not get stress images due to hemodynamic instability.  Objective:  Temp:  [97.8 F (36.6 C)-98.6 F (37 C)] 98 F (36.7 C) (05/26 0800) Pulse Rate:  [57-117] 82 (05/26 0900) Resp:  [12-28] 14 (05/26 0900) BP: (109-185)/(46-98) 133/79 mmHg (05/26 0900) SpO2:  [94 %-100 %] 100 % (05/26 0900) Weight:  [196 lb 10.4 oz (89.2 kg)] 196 lb 10.4 oz (89.2 kg) (05/26 0409) Weight change: 0.8 oz (0.023 kg)  Intake/Output from previous day: 05/25 0701 - 05/26 0700 In: 1645.6 [P.O.:480; I.V.:1165.6] Out: 1825 [Urine:1825]  Intake/Output from this shift: Total I/O In: 203.4 [I.V.:203.4] Out: 400 [Urine:400]  Medications: Current Facility-Administered Medications  Medication Dose Route Frequency Provider Last Rate Last Dose  . 0.9 %  sodium chloride infusion   Intravenous Continuous Laura R Ingold, NP 50 mL/hr at 03/13/16 1728    . 0.9 %  sodium chloride infusion  250 mL Intravenous PRN Luke K Kilroy, PA-C      . 0.9% sodium chloride infusion  1 mL/kg/hr Intravenous Continuous Luke K Kilroy, PA-C 89.2 mL/hr at 03/16/16 0700 1 mL/kg/hr at 03/16/16 0700  . acetaminophen (TYLENOL) tablet 650 mg  650 mg Oral Q4H PRN Laura R Ingold, NP      . allopurinol (ZYLOPRIM) tablet 300 mg  300 mg Oral Daily Laura R Ingold, NP   300 mg at 03/16/16 0846  . ALPRAZolam (XANAX) tablet 0.25 mg  0.25 mg Oral BID PRN Laura R Ingold, NP   0.25 mg at 03/13/16 1812  . aspirin EC tablet 81 mg  81 mg Oral Daily Laura R Ingold, NP   81 mg at 03/16/16 0845  . cyclobenzaprine (FLEXERIL) tablet 10 mg  10 mg Oral TID Laura R Ingold, NP   10 mg at 03/16/16 0845  . DOBUTamine (DOBUTREX) 1,000 mcg/mL in dextrose 5% 250 mL infusion  0-40 mcg/kg/min Intravenous Continuous Luke K Kilroy, PA-C   Stopped at 03/15/16  1242  . heparin ADULT infusion 100 units/mL (25000 units/250mL sodium chloride 0.45%)  1,100 Units/hr Intravenous Continuous Nathan J Batchelder, RPH 11 mL/hr at 03/16/16 0700 1,100 Units/hr at 03/16/16 0700  . hydrochlorothiazide (HYDRODIURIL) tablet 25 mg  25 mg Oral Daily Laura R Ingold, NP   25 mg at 03/15/16 0741  . HYDROcodone-acetaminophen (NORCO/VICODIN) 5-325 MG per tablet 1-2 tablet  1-2 tablet Oral Q4H PRN Jacob Kelly, MD   2 tablet at 03/16/16 0845  . losartan (COZAAR) tablet 100 mg  100 mg Oral Daily Laura R Ingold, NP   100 mg at 03/16/16 0845  . morphine 2 MG/ML injection 2 mg  2 mg Intravenous Q2H PRN Laura R Ingold, NP   2 mg at 03/15/16 2012  . nitroGLYCERIN (NITROSTAT) SL tablet 0.4 mg  0.4 mg Sublingual Q5 Min x 3 PRN Laura R Ingold, NP   0.4 mg at 03/15/16 1316  . nitroGLYCERIN 50 mg in dextrose 5 % 250 mL (0.2 mg/mL) infusion  5 mcg/min Intravenous Titrated Laura R Ingold, NP 1.5 mL/hr at 03/16/16 0700 5 mcg/min at 03/16/16 0700  . ondansetron (ZOFRAN) injection 4 mg  4 mg Intravenous Q6H PRN Laura R Ingold, NP      . pravastatin (PRAVACHOL) tablet 40 mg  40 mg Oral Daily Laura R Ingold, NP   40 mg at   03/16/16 0845  . sodium chloride flush (NS) 0.9 % injection 3 mL  3 mL Intravenous Q12H Luke K Kilroy, PA-C   3 mL at 03/16/16 0846  . sodium chloride flush (NS) 0.9 % injection 3 mL  3 mL Intravenous PRN Luke K Kilroy, PA-C      . zolpidem (AMBIEN) tablet 5 mg  5 mg Oral QHS PRN,MR X 1 Laura R Ingold, NP        Physical Exam: General appearance: alert, no distress and sleepy Lungs: clear to auscultation bilaterally Heart: regular rate and rhythm, S1, S2 normal, no murmur, click, rub or gallop Extremities: extremities normal, atraumatic, no cyanosis or edema Neurologic: Grossly normal  Lab Results: Results for orders placed or performed during the hospital encounter of 03/13/16 (from the past 48 hour(s))  Heparin level (unfractionated)     Status: Abnormal   Collection  Time: 03/15/16  8:00 PM  Result Value Ref Range   Heparin Unfractionated 0.74 (H) 0.30 - 0.70 IU/mL    Comment:        IF HEPARIN RESULTS ARE BELOW EXPECTED VALUES, AND PATIENT DOSAGE HAS BEEN CONFIRMED, SUGGEST FOLLOW UP TESTING OF ANTITHROMBIN III LEVELS.   CBC     Status: Abnormal   Collection Time: 03/16/16  4:12 AM  Result Value Ref Range   WBC 4.4 4.0 - 10.5 K/uL   RBC 4.28 4.22 - 5.81 MIL/uL   Hemoglobin 12.8 (L) 13.0 - 17.0 g/dL   HCT 39.6 39.0 - 52.0 %   MCV 92.5 78.0 - 100.0 fL   MCH 29.9 26.0 - 34.0 pg   MCHC 32.3 30.0 - 36.0 g/dL   RDW 13.7 11.5 - 15.5 %   Platelets 151 150 - 400 K/uL  Heparin level (unfractionated)     Status: None   Collection Time: 03/16/16  4:12 AM  Result Value Ref Range   Heparin Unfractionated 0.58 0.30 - 0.70 IU/mL    Comment:        IF HEPARIN RESULTS ARE BELOW EXPECTED VALUES, AND PATIENT DOSAGE HAS BEEN CONFIRMED, SUGGEST FOLLOW UP TESTING OF ANTITHROMBIN III LEVELS.   Protime-INR     Status: None   Collection Time: 03/16/16  4:12 AM  Result Value Ref Range   Prothrombin Time 14.5 11.6 - 15.2 seconds   INR 1.11 0.00 - 1.49    Imaging: Nm Myocar Multi W/spect W/wall Motion / Ef  03/15/2016  CLINICAL DATA:  60-year-old with chest pain. Patient developed 8/10 chest pain, left arm pain and drop in blood pressure while waiting to take the stress images. Patient was transferred to the CCU and stress images were not obtained. EXAM: MYOCARDIAL IMAGING WITH SPECT (REST) TECHNIQUE: Standard myocardial SPECT imaging was performed after resting intravenous injection of 10 mCi Tc-99m tetrofosmin. COMPARISON:  None. FINDINGS: Mildly decreased uptake along the septal wall. No other significant perfusion abnormality on the rest images. IMPRESSION: Incomplete examination without the stress images. Mildly decreased uptake along the septal wall is nonspecific without stress images. Electronically Signed   By: Adam  Henn M.D.   On: 03/15/2016 15:53     Assessment:  1. Principal Problem: 2.   Chest pain with moderate risk of acute coronary syndrome 3. Active Problems: 4.   Essential hypertension 5.   Mobitz type 1 second degree AV block 6.   Polysubstance abuse 7.   CAD-50% LAD 2011 8.   Complete heart block (HCC) 9.   Cervical spinal stenosis 10.   Dyslipidemia 11.     Unstable angina (HCC) - chest pain with complete heart block & chest pain 12.   Plan:  1. No further heart block overnight. Plan LHC today for definitive coronary assessment as stress test yesterday was incomplete due to hemodynamic instability. He is still having on and off chest pain. Now on heparin and IV nitroglycerin. Plans for possible cervical spine surgery in the near future depending on coronary findings. If there is no significant obstructive CAD, then will ask EP to evaluate for possible causes of his arrhythmias.  Time Spent Directly with Patient:  15 minutes  Length of Stay:  LOS: 3 days   Kelso Bibby C. Hadyn Azer, MD, FACC Attending Cardiologist [facility]  Cary Wilford C Cillian Gwinner 03/16/2016, 9:15 AM     

## 2016-03-16 NOTE — Interval H&P Note (Signed)
History and Physical Interval Note:  03/16/2016 10:50 AM  Russell Carter  has presented today for surgery, with the diagnosis of cp concerning for unstable angina associated with complete heart block.  The various methods of treatment have been discussed with the patient and family. After consideration of risks, benefits and other options for treatment, the patient has consented to  Procedure(s): Left Heart Cath and Coronary Angiography (N/A) with possible Percutaneous Coronary Intervention as a surgical intervention .  The patient's history has been reviewed, patient examined, no change in status, stable for surgery.  I have reviewed the patient's chart and labs.  Questions were answered to the patient's satisfaction.    Cath Lab Visit (complete for each Cath Lab visit)  Clinical Evaluation Leading to the Procedure:   ACS: No.  Non-ACS:    Anginal Classification: CCS IV  Anti-ischemic medical therapy: Minimal Therapy (1 class of medications)  Non-Invasive Test Results: Equivocal test results - unable to complete due to chest pain and complete heart block.  Prior CABG: No previous CABG  Ischemic Symptoms? CCS IV (Inability to carry on any physical activity without discomfort) Anti-ischemic Medical Therapy? Minimal Therapy (1 class of medications) Non-invasive Test Results? Equivocal test results Prior CABG? No Previous CABG   Patient Information:   1-2V-CAD with DS 50-60% With FFR  A (7)  Indication: 22; Score: 7   Patient Information:   1-2V-CAD with DS 50-60% With FFR>0.8, IVUS not significant  I (2)  Indication: 23; Score: 2   Patient Information:   3V-CAD without LMCA With Abnormal LV systolic function  A (9)  Indication: 48; Score: 9   Patient Information:   LMCA-CAD  A (9)  Indication: 49; Score: 9   Patient Information:   3V-CAD without LMCA With Abnormal LV systolic function PCI  A (7)  Indication: 62; Score: 7   Patient Information:    3V-CAD without LMCA With Abnormal LV systolic function CABG  A (8)  Indication: 62; Score: 8   Patient Information:   3V-CAD without LMCA With Low CAD burden(i.e., 3 focal stenoses, low SYNTAX score) PCI  A (7)  Indication: 63; Score: 7   Patient Information:   3V-CAD without LMCA With Low CAD burden(i.e., 3 focal stenoses, low SYNTAX score) CABG  A (9)  Indication: 63; Score: 9   Patient Information:   3V-CAD without LMCA E06c - Intermediate-high CAD burden (i.e., multiple diffuse lesions, presence of CTO, or high SYNTAX score) PCI  U (4)  Indication: 64; Score: 4   Patient Information:   3V-CAD without LMCA E06c - Intermediate-high CAD burden (i.e., multiple diffuse lesions, presence of CTO, or high SYNTAX score) CABG  A (9)  Indication: 64; Score: 9   Patient Information:   LMCA-CAD With Isolated LMCA stenosis  PCI  U (6)  Indication: 65; Score: 6   Patient Information:   LMCA-CAD With Isolated LMCA stenosis  CABG  A (9)  Indication: 65; Score: 9   Patient Information:   LMCA-CAD Additional CAD, low CAD burden (i.e., 1- to 2-vessel additional involvement, low SYNTAX score) PCI  U (5)  Indication: 66; Score: 5   Patient Information:   LMCA-CAD Additional CAD, low CAD burden (i.e., 1- to 2-vessel additional involvement, low SYNTAX score) CABG  A (9)  Indication: 66; Score: 9   Patient Information:   LMCA-CAD Additional CAD, intermediate-high CAD burden (i.e., 3-vessel involvement, presence of CTO, or high SYNTAX score) PCI  I (3)  Indication: 67; Score: 3  Patient Information:   LMCA-CAD Additional CAD, intermediate-high CAD burden (i.e., 3-vessel involvement, presence of CTO, or high SYNTAX score) CABG  A (9)  Indication: 67; Score: 9    Bryan Lemma

## 2016-03-16 NOTE — Progress Notes (Signed)
DAILY PROGRESS NOTE  Subjective:  Events noted yesterday - transferred to CCU for closer monitoring. He had no further heart block overnight. Sleepy today from pain medicine. Stress test was incomplete as he did not get stress images due to hemodynamic instability.  Objective:  Temp:  [97.8 F (36.6 C)-98.6 F (37 C)] 98 F (36.7 C) (05/26 0800) Pulse Rate:  [57-117] 82 (05/26 0900) Resp:  [12-28] 14 (05/26 0900) BP: (109-185)/(46-98) 133/79 mmHg (05/26 0900) SpO2:  [94 %-100 %] 100 % (05/26 0900) Weight:  [196 lb 10.4 oz (89.2 kg)] 196 lb 10.4 oz (89.2 kg) (05/26 0409) Weight change: 0.8 oz (0.023 kg)  Intake/Output from previous day: 05/25 0701 - 05/26 0700 In: 1645.6 [P.O.:480; I.V.:1165.6] Out: 1825 [Urine:1825]  Intake/Output from this shift: Total I/O In: 203.4 [I.V.:203.4] Out: 400 [Urine:400]  Medications: Current Facility-Administered Medications  Medication Dose Route Frequency Provider Last Rate Last Dose  . 0.9 %  sodium chloride infusion   Intravenous Continuous Leone Brand, NP 50 mL/hr at 03/13/16 1728    . 0.9 %  sodium chloride infusion  250 mL Intravenous PRN Abelino Derrick, PA-C      . 0.9% sodium chloride infusion  1 mL/kg/hr Intravenous Continuous Abelino Derrick, PA-C 89.2 mL/hr at 03/16/16 0700 1 mL/kg/hr at 03/16/16 0700  . acetaminophen (TYLENOL) tablet 650 mg  650 mg Oral Q4H PRN Leone Brand, NP      . allopurinol (ZYLOPRIM) tablet 300 mg  300 mg Oral Daily Leone Brand, NP   300 mg at 03/16/16 0846  . ALPRAZolam Prudy Feeler) tablet 0.25 mg  0.25 mg Oral BID PRN Leone Brand, NP   0.25 mg at 03/13/16 1812  . aspirin EC tablet 81 mg  81 mg Oral Daily Leone Brand, NP   81 mg at 03/16/16 0845  . cyclobenzaprine (FLEXERIL) tablet 10 mg  10 mg Oral TID Leone Brand, NP   10 mg at 03/16/16 0845  . DOBUTamine (DOBUTREX) 1,000 mcg/mL in dextrose 5% 250 mL infusion  0-40 mcg/kg/min Intravenous Continuous Abelino Derrick, PA-C   Stopped at 03/15/16  1242  . heparin ADULT infusion 100 units/mL (25000 units/274mL sodium chloride 0.45%)  1,100 Units/hr Intravenous Continuous Para March Batchelder, RPH 11 mL/hr at 03/16/16 0700 1,100 Units/hr at 03/16/16 0700  . hydrochlorothiazide (HYDRODIURIL) tablet 25 mg  25 mg Oral Daily Leone Brand, NP   25 mg at 03/15/16 0741  . HYDROcodone-acetaminophen (NORCO/VICODIN) 5-325 MG per tablet 1-2 tablet  1-2 tablet Oral Q4H PRN Leeann Must, MD   2 tablet at 03/16/16 0845  . losartan (COZAAR) tablet 100 mg  100 mg Oral Daily Leone Brand, NP   100 mg at 03/16/16 0845  . morphine 2 MG/ML injection 2 mg  2 mg Intravenous Q2H PRN Leone Brand, NP   2 mg at 03/15/16 2012  . nitroGLYCERIN (NITROSTAT) SL tablet 0.4 mg  0.4 mg Sublingual Q5 Min x 3 PRN Leone Brand, NP   0.4 mg at 03/15/16 1316  . nitroGLYCERIN 50 mg in dextrose 5 % 250 mL (0.2 mg/mL) infusion  5 mcg/min Intravenous Titrated Leone Brand, NP 1.5 mL/hr at 03/16/16 0700 5 mcg/min at 03/16/16 0700  . ondansetron (ZOFRAN) injection 4 mg  4 mg Intravenous Q6H PRN Leone Brand, NP      . pravastatin (PRAVACHOL) tablet 40 mg  40 mg Oral Daily Leone Brand, NP   40 mg at  03/16/16 0845  . sodium chloride flush (NS) 0.9 % injection 3 mL  3 mL Intravenous Q12H Abelino Derrick, PA-C   3 mL at 03/16/16 0846  . sodium chloride flush (NS) 0.9 % injection 3 mL  3 mL Intravenous PRN Abelino Derrick, PA-C      . zolpidem (AMBIEN) tablet 5 mg  5 mg Oral QHS PRN,MR X 1 Leone Brand, NP        Physical Exam: General appearance: alert, no distress and sleepy Lungs: clear to auscultation bilaterally Heart: regular rate and rhythm, S1, S2 normal, no murmur, click, rub or gallop Extremities: extremities normal, atraumatic, no cyanosis or edema Neurologic: Grossly normal  Lab Results: Results for orders placed or performed during the hospital encounter of 03/13/16 (from the past 48 hour(s))  Heparin level (unfractionated)     Status: Abnormal   Collection  Time: 03/15/16  8:00 PM  Result Value Ref Range   Heparin Unfractionated 0.74 (H) 0.30 - 0.70 IU/mL    Comment:        IF HEPARIN RESULTS ARE BELOW EXPECTED VALUES, AND PATIENT DOSAGE HAS BEEN CONFIRMED, SUGGEST FOLLOW UP TESTING OF ANTITHROMBIN III LEVELS.   CBC     Status: Abnormal   Collection Time: 03/16/16  4:12 AM  Result Value Ref Range   WBC 4.4 4.0 - 10.5 K/uL   RBC 4.28 4.22 - 5.81 MIL/uL   Hemoglobin 12.8 (L) 13.0 - 17.0 g/dL   HCT 16.1 09.6 - 04.5 %   MCV 92.5 78.0 - 100.0 fL   MCH 29.9 26.0 - 34.0 pg   MCHC 32.3 30.0 - 36.0 g/dL   RDW 40.9 81.1 - 91.4 %   Platelets 151 150 - 400 K/uL  Heparin level (unfractionated)     Status: None   Collection Time: 03/16/16  4:12 AM  Result Value Ref Range   Heparin Unfractionated 0.58 0.30 - 0.70 IU/mL    Comment:        IF HEPARIN RESULTS ARE BELOW EXPECTED VALUES, AND PATIENT DOSAGE HAS BEEN CONFIRMED, SUGGEST FOLLOW UP TESTING OF ANTITHROMBIN III LEVELS.   Protime-INR     Status: None   Collection Time: 03/16/16  4:12 AM  Result Value Ref Range   Prothrombin Time 14.5 11.6 - 15.2 seconds   INR 1.11 0.00 - 1.49    Imaging: Nm Myocar Multi W/spect W/wall Motion / Ef  03/15/2016  CLINICAL DATA:  60 year old with chest pain. Patient developed 8/10 chest pain, left arm pain and drop in blood pressure while waiting to take the stress images. Patient was transferred to the CCU and stress images were not obtained. EXAM: MYOCARDIAL IMAGING WITH SPECT (REST) TECHNIQUE: Standard myocardial SPECT imaging was performed after resting intravenous injection of 10 mCi Tc-40m tetrofosmin. COMPARISON:  None. FINDINGS: Mildly decreased uptake along the septal wall. No other significant perfusion abnormality on the rest images. IMPRESSION: Incomplete examination without the stress images. Mildly decreased uptake along the septal wall is nonspecific without stress images. Electronically Signed   By: Richarda Overlie M.D.   On: 03/15/2016 15:53     Assessment:  1. Principal Problem: 2.   Chest pain with moderate risk of acute coronary syndrome 3. Active Problems: 4.   Essential hypertension 5.   Mobitz type 1 second degree AV block 6.   Polysubstance abuse 7.   CAD-50% LAD 2011 8.   Complete heart block (HCC) 9.   Cervical spinal stenosis 10.   Dyslipidemia 11.  Unstable angina (HCC) - chest pain with complete heart block & chest pain 12.   Plan:  1. No further heart block overnight. Plan LHC today for definitive coronary assessment as stress test yesterday was incomplete due to hemodynamic instability. He is still having on and off chest pain. Now on heparin and IV nitroglycerin. Plans for possible cervical spine surgery in the near future depending on coronary findings. If there is no significant obstructive CAD, then will ask EP to evaluate for possible causes of his arrhythmias.  Time Spent Directly with Patient:  15 minutes  Length of Stay:  LOS: 3 days   Chrystie Nose, MD, [facility] Attending Cardiologist [facility]  Chrystie Nose 03/16/2016, 9:15 AM

## 2016-03-17 ENCOUNTER — Encounter (HOSPITAL_COMMUNITY): Payer: Self-pay | Admitting: Student

## 2016-03-17 ENCOUNTER — Other Ambulatory Visit: Payer: Self-pay | Admitting: Student

## 2016-03-17 DIAGNOSIS — Z955 Presence of coronary angioplasty implant and graft: Secondary | ICD-10-CM

## 2016-03-17 DIAGNOSIS — E785 Hyperlipidemia, unspecified: Secondary | ICD-10-CM

## 2016-03-17 LAB — CBC
HCT: 39.5 % (ref 39.0–52.0)
Hemoglobin: 12.8 g/dL — ABNORMAL LOW (ref 13.0–17.0)
MCH: 30 pg (ref 26.0–34.0)
MCHC: 32.4 g/dL (ref 30.0–36.0)
MCV: 92.5 fL (ref 78.0–100.0)
PLATELETS: 163 10*3/uL (ref 150–400)
RBC: 4.27 MIL/uL (ref 4.22–5.81)
RDW: 13.8 % (ref 11.5–15.5)
WBC: 4.1 10*3/uL (ref 4.0–10.5)

## 2016-03-17 LAB — BASIC METABOLIC PANEL
Anion gap: 10 (ref 5–15)
BUN: 10 mg/dL (ref 6–20)
CHLORIDE: 105 mmol/L (ref 101–111)
CO2: 21 mmol/L — ABNORMAL LOW (ref 22–32)
CREATININE: 1.22 mg/dL (ref 0.61–1.24)
Calcium: 9.2 mg/dL (ref 8.9–10.3)
GFR calc Af Amer: >60 mL/min (ref >60)
GLUCOSE: 125 mg/dL — AB (ref 65–99)
Potassium: 3.4 mmol/L — ABNORMAL LOW (ref 3.5–5.1)
SODIUM: 136 mmol/L (ref 135–145)

## 2016-03-17 LAB — GLUCOSE, CAPILLARY: GLUCOSE-CAPILLARY: 97 mg/dL (ref 65–99)

## 2016-03-17 MED ORDER — TICAGRELOR 90 MG PO TABS: 90.0000 mg | ORAL_TABLET | Freq: Two times a day (BID) | ORAL | Status: DC

## 2016-03-17 MED ORDER — LOSARTAN POTASSIUM 100 MG PO TABS: 100.0000 mg | ORAL_TABLET | Freq: Every day | ORAL | Status: DC

## 2016-03-17 MED ORDER — MECLIZINE HCL 25 MG PO TABS: 25.0000 mg | ORAL_TABLET | Freq: Three times a day (TID) | ORAL | Status: DC | PRN

## 2016-03-17 MED ORDER — CYCLOBENZAPRINE HCL 10 MG PO TABS: 10.0000 mg | ORAL_TABLET | Freq: Three times a day (TID) | ORAL | Status: DC | PRN

## 2016-03-17 MED ORDER — POTASSIUM CHLORIDE CRYS ER 20 MEQ PO TBCR
40.0000 meq | EXTENDED_RELEASE_TABLET | Freq: Once | ORAL | Status: AC
  Administered 2016-03-17: 40 meq via ORAL
  Filled 2016-03-17: qty 2

## 2016-03-17 MED ORDER — ALLOPURINOL 300 MG PO TABS: 300.0000 mg | ORAL_TABLET | Freq: Every day | ORAL | Status: DC

## 2016-03-17 MED ORDER — HYDROCODONE-ACETAMINOPHEN 5-325 MG PO TABS: 1.0000 | ORAL_TABLET | ORAL | Status: DC | PRN

## 2016-03-17 MED ORDER — ASPIRIN 81 MG PO TBEC: 81.0000 mg | DELAYED_RELEASE_TABLET | Freq: Every day | ORAL | Status: DC

## 2016-03-17 NOTE — Discharge Instructions (Signed)
Plavix is a blood thinner that will increase risk of bleeding. Do not take over the counter pain medication such as Ibuprofen (Advil, Motrin) or Naprosyn (Aleve). This will further increase your bleeding risk. Use tylenol or acetaminophen for pain. Indocin (indomethacin) also increases bleeding risk. Take Tylenol (acetaminophen) for pain.

## 2016-03-17 NOTE — Progress Notes (Signed)
Hospital Problem List     Principal Problem:   Chest pain with moderate risk of acute coronary syndrome Active Problems:   Essential hypertension   Polysubstance abuse   CAD-50% LAD 2011   Complete heart block (HCC)   Cervical spinal stenosis   Dyslipidemia   Unstable angina (HCC) - chest pain with complete heart block & chest pain    Patient Profile:   Primary Cardiologist: Dr. Eden Emms  60 yo male w/ PMH of moderate non-obstructive CAD by cath in 2011, HTN, HLD, and TIA who presented to [facility] ED on 03/13/2016 for CP. Episodes of transient CHB noted on telemetry. Unable to tolerate NST due to hemodynamic instability. Went for cardiac catheterization yesterday.  Subjective   Denies any chest pain or shortness of breath. Having significant back pain and leg pain, consistent with previous pain he has experienced secondary to spondylosis.  Inpatient Medications    . allopurinol  300 mg Oral Daily  . aspirin EC  81 mg Oral Daily  . cyclobenzaprine  10 mg Oral TID  . hydrochlorothiazide  25 mg Oral Daily  . losartan  100 mg Oral Daily  . pravastatin  40 mg Oral Daily  . sodium chloride flush  3 mL Intravenous Q12H  . ticagrelor  90 mg Oral BID    Vital Signs    Filed Vitals:   03/16/16 2100 03/16/16 2150 03/17/16 0427 03/17/16 0719  BP: 140/117 151/86 151/83 177/92  Pulse: 102 96 83 75  Temp:   98 F (36.7 C) 98.2 F (36.8 C)  TempSrc:   Oral Oral  Resp: 20 19 16 12   Height:      Weight:   205 lb 4 oz (93.1 kg)   SpO2: 100% 97% 98% 100%    Intake/Output Summary (Last 24 hours) at 03/17/16 0804 Last data filed at 03/17/16 0428  Gross per 24 hour  Intake 1305.46 ml  Output   1550 ml  Net -244.54 ml   Filed Weights   03/15/16 0700 03/16/16 0409 03/17/16 0427  Weight: 196 lb 9.6 oz (89.177 kg) 196 lb 10.4 oz (89.2 kg) 205 lb 4 oz (93.1 kg)    Physical Exam    General: African American male appearing in no acute distress. Head: Normocephalic,  atraumatic.  Neck: Supple without bruits, JVD not elevated. Lungs:  Resp regular and unlabored, CTA without wheezing or rales. Heart: RRR, S1, S2, no S3, S4, or murmur; no rub. Abdomen: Soft, non-tender, non-distended with normoactive bowel sounds. No hepatomegaly. No rebound/guarding. No obvious abdominal masses. Extremities: No clubbing, cyanosis, or edema. Distal pedal pulses are 2+ bilaterally. Right radial cath site without ecchymosis or evidence of a hematoma. Neuro: Alert and oriented X 3. Moves all extremities spontaneously. Psych: Normal affect.  Labs    CBC  Recent Labs  03/16/16 0412 03/17/16 0423  WBC 4.4 4.1  HGB 12.8* 12.8*  HCT 39.6 39.5  MCV 92.5 92.5  PLT 151 163   Basic Metabolic Panel  Recent Labs  03/17/16 0423  NA 136  K 3.4*  CL 105  CO2 21*  GLUCOSE 125*  BUN 10  CREATININE 1.22  CALCIUM 9.2    Telemetry    NSR, HR in 60's - 70's. No atopic events.  ECG    NSR, HR 82. No acute changes.   Cardiac Studies and Radiology    Mr Cervical Spine W Wo Contrast: 03/14/2016  CLINICAL DATA:  Initial evaluation for acute left arm pain. EXAM:  MRI CERVICAL SPINE WITHOUT AND WITH CONTRAST TECHNIQUE: Multiplanar and multiecho pulse sequences of the cervical spine, to include the craniocervical junction and cervicothoracic junction, were obtained according to standard protocol without and with intravenous contrast. CONTRAST:  15mL MULTIHANCE GADOBENATE DIMEGLUMINE 529 MG/ML IV SOLN COMPARISON:  None. FINDINGS: Visualized portions of the brain and posterior fossa demonstrate no acute abnormality. Craniocervical junction widely patent. Reversal of the normal cervical lordosis with apex at C4-5. Trace anterolisthesis of C3 on C4. No other listhesis or malalignment. Mild degenerative vertebral body height loss at C5 and C6. Vertebral body heights otherwise well maintained. No evidence for acute or chronic fracture. Signal intensity within the vertebral body bone marrow  is normal. No focal osseous lesions. No marrow edema. No abnormal enhancement. Signal intensity within the cervical spinal cord is normal. Paraspinous soft tissues demonstrate no acute abnormality. No prevertebral edema. Normal intravascular flow voids present within the vertebral arteries bilaterally. C2-C3: Bilateral uncovertebral spurring with facet arthrosis. Minimal posterior disc bulge. Broad-based disc osteophyte complex minimally indents the ventral thecal sac without significant canal stenosis. No significant foraminal narrowing at this level. C3-C4: Mild diffuse disc bulge with bilateral uncovertebral spurring. Left greater than right facet arthrosis. Resultant severe left foraminal narrowing with more moderate right foraminal stenosis. Broad-based posterior disc osteophyte indents the ventral thecal sac and abuts the cervical spinal cord with resultant mild to moderate canal stenosis. C4-C5: Degenerative intervertebral disc space narrowing. Prominent anterior endplate osteophytic spurring. Bilateral uncovertebral hypertrophy with mild facet disease. Broad-based posterior disc osteophyte complex indents the ventral thecal sac with resultant mild to moderate canal stenosis. Severe left foraminal narrowing with more moderate right foraminal stenosis. C5-C6: Degenerative intervertebral disc space narrowing with prominent endplate anterior osteophytic spurring. Severe bilateral uncovertebral hypertrophy. Bilateral facet arthrosis. Broad-based posterior disc osteophyte complex flattens and partially effaces the ventral thecal sac with resultant moderate canal stenosis. Superimposed mild ligamentum flavum thickening. There is severe bilateral foraminal stenosis. C6-C7: Bilateral uncovertebral hypertrophy with prominent endplate osteophytic spurring anteriorly. Degenerative intervertebral disc space narrowing with diffuse disc bulge. Disc bulging is slightly eccentric to the right. Broad-based posterior disc  osteophyte flattens the ventral thecal sac and results in moderate canal stenosis. Thecal sac measures 9 mm in AP diameter. Severe bilateral foraminal stenosis present. C7-T1: Degenerative intervertebral disc space narrowing with endplate osteophytic spurring anteriorly. Mild diffuse disc bulge. Bilateral uncovertebral spurring. Resultant severe bilateral foraminal stenosis. Posterior disc bulge flattens the ventral thecal sac with resultant mild canal stenosis. Visualized upper thoracic spine demonstrates no acute abnormality. No significant stenosis identified. IMPRESSION: 1. 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 Resultant mild to moderate diffuse canal narrowing at C3-4 through C6-7, most severe at C5-6 and C6-7. 2. Multifactorial degenerative changes with resultant severe left foraminal stenosis at C3-4, C4-5, C5-6, C6-7, and C7-T1. Severe right foraminal stenosis at C5-6, C6-7, and C7-T1. Electronically Signed   By: Rise Mu M.D.   On: 03/14/2016 01:01   Nm Myocar Multi W/spect W/wall Motion / Ef: 03/15/2016  CLINICAL DATA:  60 year old with chest pain. Patient developed 8/10 chest pain, left arm pain and drop in blood pressure while waiting to take the stress images. Patient was transferred to the CCU and stress images were not obtained. EXAM: MYOCARDIAL IMAGING WITH SPECT (REST) TECHNIQUE: Standard myocardial SPECT imaging was performed after resting intravenous injection of 10 mCi Tc-33m tetrofosmin. COMPARISON:  None. FINDINGS: Mildly decreased uptake along the septal wall. No  other significant perfusion abnormality on the rest images. IMPRESSION: Incomplete examination without the stress images. Mildly decreased uptake along the septal wall is nonspecific without stress images. Electronically Signed   By: Richarda Overlie M.D.   On: 03/15/2016 15:53   Cardiac Catheterization: 03/16/2016   1. Mid RCA lesion, 95% stenosed. Post intervention with a Promus DES stent 2.5 mm x 20 mm (2.8 mm), there is a 0% residual stenosis. 2. The left ventricular systolic function is normal. 3. Otherwise minimal CAD  Severe single vessel disease involving the mid RCA treated with a DES stent. Due to the length of lesion and initial potential size of the vessel I chose to use a DES stent in this patient with diabetes.  He does have severe cervical spine disease that will likely need to be treated. Based on your pain trial data, we can consider potentially holding Brilinta for possible surgery in 6 months. Best option would be to bridge with Aggrastat, however if bleeding this would be too high, I think it 6 months he would be fine stopping Brilinta for 5-7 days preoperatively.  Plan:  Transfer to 6 central post procedure unit.  TR band removal per protocol.  On aspirin plus Brilinta for a year, but okay to potentially suspend Brilinta for 5-7 days preoperatively after roughly 6 months.  Continue aggressive risk modification.  Counseling reference absence of cocaine use.  Assessment & Plan    1. Unstable Angina/ CAD - Cardiac catheterization on 03/16/2016 showed 95% stenosis in the Mid RCA lesion. Post intervention with a Promus DES stent 2.5 mm x 20 mm (2.8 mm), there was a 0% residual stenosis. LV function was normal. He was started on DAPT with ASA and Brilinta. Will be given 30-day card prior to discharge.  - continue ARB and statin. No BB in setting of recent CHB. Could consider as an outpatient.  2. Transient CHB - occurred prior to his catheterization. No recurrence noted on telemetry.  3. History of Substance Abuse - UDS positive for Cocaine 3 years ago. UDS not obtained this admission. - denies any recent use. Continued cessation advised.  4. Hypokalemia - K+ 3.4 this AM.  - will replace.  5. HTN - BP has been elevated since admission, currently at 177/92, likely  exacerbated by pain. Reports the lowest his BP is at home is high-140's/90's. - will ask for morning medications to be given at this time.  6. Cervical Spondylosis - Neurosurgery evaluated this admission. Will need follow-up with Dr. Bevely Palmer at time of discharge for likely C3-T1 laminectomy and fusion in the future. - per Dr. Herbie Baltimore, "based on pain trial data, we can consider potentially holding Brilinta for possible surgery in 6 months. Best option would be to bridge with Aggrastat, however if bleeding this would be too high, I think it 6 months he would be fine stopping Brilinta for 5-7 days preoperatively."  Signed, Ellsworth Lennox , PA-C 8:04 AM 03/17/2016 Pager: [phone]  The patient was seen and examined, and I agree with the physical exam, assessment and plan as documented above which has been discussed with B. Strader PA-C, with modifications as noted below. Pt doing well post-PCI.  BP remains elevated due to cervical spondylosis pain. Antihypertensive therapy may need adjustments in the near future in the outpatient setting. No beta blockers due to transient CHB. Continue DAPT and ARB along with statin therapy. Plan for discharge today.   Prentice Docker, MD, [facility]  03/17/2016 8:47 AM

## 2016-03-17 NOTE — Discharge Summary (Signed)
Discharge Summary    Patient ID: Russell Carter,  MRN: [id number], DOB/AGE: Sep 15, 1956 60 y.o.  Admit date: 03/13/2016 Discharge date: 03/17/2016  Primary Care Provider: [facility] Primary Cardiologist: Dr. Eden Emms  Discharge Diagnoses    Principal Problem:   Chest pain with moderate risk of acute coronary syndrome Active Problems:   Essential hypertension   Polysubstance abuse   CAD-50% LAD 2011   Complete heart block (HCC)   Cervical spinal stenosis   Dyslipidemia   Unstable angina (HCC) - chest pain with complete heart block & chest pain   History of Present Illness     Russell Carter is a 60 y.o. male with past medical history of moderate non-obstructive CAD by cath in 2011, HTN, HLD, and TIA who presented to [facility] ED on 03/13/2016 for evaluation of chest pain.   Reported developing a mid-sternal crushing pain the previous day which was transient but associated with dyspnea and diaphoresis. Also reported having left arm and left leg numbness for the past 4 days.  While in the ED, his initial troponin was negative and EKG showed no acute ischemic changes. He was admitted for further observation.  Hospital Course     Consultants: Nuerosurgery  Overnight, he has transient episodes of CHB noted on telemetry which were associated with hypotension. Cardiac enzymes were negative. An MRI had also been performed due to his numbness which showed significant cervical stenosis of C1-T1. Neurosurgery was consulted and recommended follow-up in clinic after cardiac clearance was obtained for likely C3-T1 laminectomy and fusion.   He was scheduled for a Dobutamine Myoview the following day secondary to his recent CHB but was unable to tolerate the test due to hemodynamic instability. Therefore, he was started on IV Heparin with anticipation of a cardiac catheterization the following day. The risks and benefits of the procedure were explained to the patient in detail and he  agreed to proceed.   His cath showed 95% stenosis in the Mid RCA. Post intervention with a Promus DES stent 2.5 mm x 20 mm (2.8 mm), there was a 0% residual stenosis. LV function was normal. He was started on DAPT with ASA and Brilinta. He tolerated the procedure well and no complications were noted. Dr. Herbie Baltimore made mention that, "based on pain trial data, we can consider potentially holding Brilinta for possible surgery in 6 months. Best option would be to bridge with Aggrastat, however if bleeding this would be too high, I think in 6 months he would be fine stopping Brilinta for 5-7 days preoperatively."  The following morning, he denied any repeat episodes of chest discomfort or dyspnea. He did report significant arm, back, and leg pain consistent with his previous symptoms thought to be secondary to his cervical stenosis. His right radial cath site appeared stable. Telemetry showed no repeat episodes of CHB. He ambulated over 700 ft with cardiac rehab without any anginal symptoms. He was hypertensive with an SBP in the 170's and his morning cardiac medications were administered to assist with this. Reports the lowest SBP he has at home is in the high-140's. He will likely require further titration of his anti-hypertensive medications as an outpatient.  He was last examined by Dr. Purvis Sheffield and deemed stable for discharge. A message was sent to the office for a 10-day TCM appointment. He was provided with an Rx for the pain medication and muscle relaxant he has been on while admitted for his cervical stenosis until he can be seen by  the Neurosurgeon in the next two weeks. No refills will be provided by the Cardiology service. He was discharged home in stable condition.  _____________  Discharge Vitals Blood pressure 177/92, pulse 75, temperature 98.2 F (36.8 C), temperature source Oral, resp. rate 12, height  (1.803 m), weight 205 lb 4 oz (93.1 kg), SpO2 100 %.  Filed Weights   03/15/16  0700 03/16/16 0409 03/17/16 0427  Weight: 196 lb 9.6 oz (89.177 kg) 196 lb 10.4 oz (89.2 kg) 205 lb 4 oz (93.1 kg)    Labs & Radiologic Studies     CBC  Recent Labs  03/16/16 0412 03/17/16 0423  WBC 4.4 4.1  HGB 12.8* 12.8*  HCT 39.6 39.5  MCV 92.5 92.5  PLT 151 163   Basic Metabolic Panel  Recent Labs  03/17/16 0423  NA 136  K 3.4*  CL 105  CO2 21*  GLUCOSE 125*  BUN 10  CREATININE 1.22  CALCIUM 9.2   Dg Chest 2 View: 03/13/2016  CLINICAL DATA:  Weakness and progressive chest pain last 5 days EXAM: CHEST  2 VIEW COMPARISON:  09/12/2015 FINDINGS: Cardiomediastinal silhouette is stable. No acute infiltrate or pleural effusion. No pulmonary edema. Degenerative changes bilateral shoulders. IMPRESSION: No active cardiopulmonary disease. Electronically Signed   By: Natasha Mead M.D.   On: 03/13/2016 12:40   Mr Cervical Spine W Wo Contrast: 03/14/2016  CLINICAL DATA:  Initial evaluation for acute left arm pain. EXAM: MRI CERVICAL SPINE WITHOUT AND WITH CONTRAST TECHNIQUE: Multiplanar and multiecho pulse sequences of the cervical spine, to include the craniocervical junction and cervicothoracic junction, were obtained according to standard protocol without and with intravenous contrast. CONTRAST:  15mL MULTIHANCE GADOBENATE DIMEGLUMINE 529 MG/ML IV SOLN COMPARISON:  None. FINDINGS: Visualized portions of the brain and posterior fossa demonstrate no acute abnormality. Craniocervical junction widely patent. Reversal of the normal cervical lordosis with apex at C4-5. Trace anterolisthesis of C3 on C4. No other listhesis or malalignment. Mild degenerative vertebral body height loss at C5 and C6. Vertebral body heights otherwise well maintained. No evidence for acute or chronic fracture. Signal intensity within the vertebral body bone marrow is normal. No focal osseous lesions. No marrow edema. No abnormal enhancement. Signal intensity within the cervical spinal cord is normal. Paraspinous soft  tissues demonstrate no acute abnormality. No prevertebral edema. Normal intravascular flow voids present within the vertebral arteries bilaterally. C2-C3: Bilateral uncovertebral spurring with facet arthrosis. Minimal posterior disc bulge. Broad-based disc osteophyte complex minimally indents the ventral thecal sac without significant canal stenosis. No significant foraminal narrowing at this level. C3-C4: Mild diffuse disc bulge with bilateral uncovertebral spurring. Left greater than right facet arthrosis. Resultant severe left foraminal narrowing with more moderate right foraminal stenosis. Broad-based posterior disc osteophyte indents the ventral thecal sac and abuts the cervical spinal cord with resultant mild to moderate canal stenosis. C4-C5: Degenerative intervertebral disc space narrowing. Prominent anterior endplate osteophytic spurring. Bilateral uncovertebral hypertrophy with mild facet disease. Broad-based posterior disc osteophyte complex indents the ventral thecal sac with resultant mild to moderate canal stenosis. Severe left foraminal narrowing with more moderate right foraminal stenosis. C5-C6: Degenerative intervertebral disc space narrowing with prominent endplate anterior osteophytic spurring. Severe bilateral uncovertebral hypertrophy. Bilateral facet arthrosis. Broad-based posterior disc osteophyte complex flattens and partially effaces the ventral thecal sac with resultant moderate canal stenosis. Superimposed mild ligamentum flavum thickening. There is severe bilateral foraminal stenosis. C6-C7: Bilateral uncovertebral hypertrophy with prominent endplate osteophytic spurring anteriorly. Degenerative intervertebral disc space narrowing with  diffuse disc bulge. Disc bulging is slightly eccentric to the right. Broad-based posterior disc osteophyte flattens the ventral thecal sac and results in moderate canal stenosis. Thecal sac measures 9 mm in AP diameter. Severe bilateral foraminal stenosis  present. C7-T1: Degenerative intervertebral disc space narrowing with endplate osteophytic spurring anteriorly. Mild diffuse disc bulge. Bilateral uncovertebral spurring. Resultant severe bilateral foraminal stenosis. Posterior disc bulge flattens the ventral thecal sac with resultant mild canal stenosis. Visualized upper thoracic spine demonstrates no acute abnormality. No significant stenosis identified. IMPRESSION: 1. 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 Resultant mild to moderate diffuse canal narrowing at C3-4 through C6-7, most severe at C5-6 and C6-7. 2. Multifactorial degenerative changes with resultant severe left foraminal stenosis at C3-4, C4-5, C5-6, C6-7, and C7-T1. Severe right foraminal stenosis at C5-6, C6-7, and C7-T1. Electronically Signed   By: Rise Mu M.D.   On: 03/14/2016 01:01   Nm Myocar Multi W/spect W/wall Motion / Ef: 03/15/2016  CLINICAL DATA:  60 year old with chest pain. Patient developed 8/10 chest pain, left arm pain and drop in blood pressure while waiting to take the stress images. Patient was transferred to the CCU and stress images were not obtained. EXAM: MYOCARDIAL IMAGING WITH SPECT (REST) TECHNIQUE: Standard myocardial SPECT imaging was performed after resting intravenous injection of 10 mCi Tc-41m tetrofosmin. COMPARISON:  None. FINDINGS: Mildly decreased uptake along the septal wall. No other significant perfusion abnormality on the rest images. IMPRESSION: Incomplete examination without the stress images. Mildly decreased uptake along the septal wall is nonspecific without stress images. Electronically Signed   By: Richarda Overlie M.D.   On: 03/15/2016 15:53     Diagnostic Studies/Procedures    Cardiac Catheterization: 03/16/2016  1. Mid RCA lesion, 95% stenosed. Post intervention with a Promus DES stent 2.5 mm x 20 mm (2.8 mm), there is a 0% residual  stenosis. 2. The left ventricular systolic function is normal. 3. Otherwise minimal CAD  Severe single vessel disease involving the mid RCA treated with a DES stent. Due to the length of lesion and initial potential size of the vessel I chose to use a DES stent in this patient with diabetes.  He does have severe cervical spine disease that will likely need to be treated. Based on your pain trial data, we can consider potentially holding Brilinta for possible surgery in 6 months. Best option would be to bridge with Aggrastat, however if bleeding this would be too high, I think it 6 months he would be fine stopping Brilinta for 5-7 days preoperatively.  Plan:  Transfer to 6 central post procedure unit.  TR band removal per protocol.  On aspirin plus Brilinta for a year, but okay to potentially suspend Brilinta for 5-7 days preoperatively after roughly 6 months.  Continue aggressive risk modification.  Counseling reference absence of cocaine use.    Disposition   Pt is being discharged home today in good condition.  Follow-up Plans & Appointments    Follow-up Information    Follow up with Loura Halt Ditty, MD In 1 week.   Specialty:  Neurosurgery   Why:  Call to arrange an appointment within 1 week.   Contact information:   [address] [place] [address] [phone]       Follow up with Charlton Haws, MD.   Specialty:  Cardiology   Why:  The office will call you to arrange follow-up. Please call the  number provided within 3 business days if you do not hear from them before then.   Contact information:   [address] [place] [address] [phone]      Discharge Instructions    Amb Referral to Cardiac Rehabilitation    Complete by:  As directed   Diagnosis:  Coronary Stents     Diet - low sodium heart healthy    Complete by:  As directed      Discharge instructions    Complete by:  As directed   PLEASE REMEMBER TO BRING ALL  OF YOUR MEDICATIONS TO EACH OF YOUR FOLLOW-UP OFFICE VISITS.  PLEASE ATTEND ALL SCHEDULED FOLLOW-UP APPOINTMENTS.   Activity: Increase activity slowly as tolerated. You may shower, but no soaking baths (or swimming) for 1 week. No driving for 24 hours. No lifting over 5 lbs for 1 week. No sexual activity for 1 week.   You May Return to Work: in 1 week (if applicable)  Wound Care: You may wash cath site gently with soap and water. Keep cath site clean and dry. If you notice pain, swelling, bleeding or pus at your cath site, please call [phone].     Increase activity slowly    Complete by:  As directed            Discharge Medications   Current Discharge Medication List    START taking these medications   Details  HYDROcodone-acetaminophen (NORCO/VICODIN) 5-325 MG tablet Take 1-2 tablets by mouth every 4 (four) hours as needed for moderate pain or severe pain. Qty: 30 tablet, Refills: 0    ticagrelor (BRILINTA) 90 MG TABS tablet Take 1 tablet (90 mg total) by mouth 2 (two) times daily. Qty: 60 tablet, Refills: 0      CONTINUE these medications which have CHANGED   Details  allopurinol (ZYLOPRIM) 300 MG tablet Take 1 tablet (300 mg total) by mouth daily. Qty: 30 tablet, Refills: 6    aspirin EC 81 MG EC tablet Take 1 tablet (81 mg total) by mouth daily.    cyclobenzaprine (FLEXERIL) 10 MG tablet Take 1 tablet (10 mg total) by mouth 3 (three) times daily as needed. For muscle spasms Qty: 30 tablet, Refills: 0    losartan (COZAAR) 100 MG tablet Take 1 tablet (100 mg total) by mouth daily. Qty: 30 tablet, Refills: 11    meclizine (ANTIVERT) 25 MG tablet Take 1 tablet (25 mg total) by mouth 3 (three) times daily as needed. For vertigo Qty: 30 tablet, Refills: 6      CONTINUE these medications which have NOT CHANGED   Details  CALCIUM-VITAMIN D PO Take 1 tablet by mouth daily.    hydrochlorothiazide (HYDRODIURIL) 25 MG tablet Take 1 tablet (25 mg total) by mouth  daily. Qty: 30 tablet, Refills: 11    Iron-Vitamins (GERITOL PO) Take 2 tablets by mouth daily.    pravastatin (PRAVACHOL) 40 MG tablet Take 1 tablet (40 mg total) by mouth daily. Qty: 30 tablet, Refills: 11    diazepam (VALIUM) 5 MG tablet Take 1 tablet (5 mg total) by mouth every 6 (six) hours as needed for muscle spasms. Qty: 20 tablet, Refills: 0    nitroGLYCERIN (NITROSTAT) 0.4 MG SL tablet Place 1 tablet (0.4 mg total) under the tongue every 5 (five) minutes as needed for chest pain. Qty: 25 tablet, Refills: 4      STOP taking these medications     ibuprofen (ADVIL,MOTRIN) 800 MG tablet  indomethacin (INDOCIN) 25 MG capsule          Allergies Allergies  Allergen Reactions  . Shellfish Allergy Anaphylaxis     Outstanding Labs/Studies   None  Duration of Discharge Encounter   Greater than 30 minutes including physician time.  Signed, Ellsworth Lennox, PA-C 03/17/2016, 10:14 AM

## 2016-03-17 NOTE — Progress Notes (Signed)
CARDIAC REHAB PHASE I   PRE:  Rate/Rhythm: 86 SR  BP:  Supine:   Sitting: 176/96  Standing:    SaO2: 96 RA   MODE:  Ambulation: 700 ft   POST:  Rate/Rhythm: 92 SR  BP:  Supine:   Sitting: 182/90  Standing:    SaO2: 95 RA Tolerated ambulation well, education completed, referred to phase ll cardiac rehab in [place], [place].  Not sure what his exercise capability will be based on his cervical spine disease.   [id number] Cindra Eves RN, BSN 03/17/2016 9:44 AM

## 2016-03-27 ENCOUNTER — Telehealth: Payer: Self-pay | Admitting: Cardiovascular Disease

## 2016-03-27 NOTE — Telephone Encounter (Signed)
New Message  TCM    Please schedule this patient for a follow-up appointment and call them with that information.   Primary Cardiologist: Dr. Eden Emms  Date of Discharge: 03/17/2016  Appointment Needed Within: 10 days  Appointment Type: 10-day TCM appointment with an APP   Thank you!  [place] M. Strader, PA-C    This message was sent 1 week ago. We made two attempts to call the patient. Appt scheduled for 6 /05/2016 at 3p with Nada Boozer

## 2016-03-28 NOTE — Telephone Encounter (Signed)
Patient contacted regarding discharge from [facility]   Patient understands to follow up with provider ? 03/29/16 with Nada Boozer, NP at 3pm; asked to arrive 75 minuets early Patient understands discharge instructions? Yes   Patient understands medications and regiment? Yes  Patient understands to bring all medications to this visit? Yes

## 2016-03-29 ENCOUNTER — Ambulatory Visit (INDEPENDENT_AMBULATORY_CARE_PROVIDER_SITE_OTHER): Payer: Medicaid Other | Admitting: Cardiology

## 2016-03-29 ENCOUNTER — Encounter: Payer: Self-pay | Admitting: Cardiology

## 2016-03-29 VITALS — BP 156/104 | HR 88 | Ht 71.0 in | Wt 195.1 lb

## 2016-03-29 DIAGNOSIS — I2510 Atherosclerotic heart disease of native coronary artery without angina pectoris: Secondary | ICD-10-CM | POA: Diagnosis not present

## 2016-03-29 DIAGNOSIS — E7800 Pure hypercholesterolemia, unspecified: Secondary | ICD-10-CM

## 2016-03-29 DIAGNOSIS — M4802 Spinal stenosis, cervical region: Secondary | ICD-10-CM

## 2016-03-29 DIAGNOSIS — I25810 Atherosclerosis of coronary artery bypass graft(s) without angina pectoris: Secondary | ICD-10-CM

## 2016-03-29 DIAGNOSIS — I442 Atrioventricular block, complete: Secondary | ICD-10-CM | POA: Diagnosis not present

## 2016-03-29 DIAGNOSIS — I10 Essential (primary) hypertension: Secondary | ICD-10-CM | POA: Diagnosis not present

## 2016-03-29 MED ORDER — HYDROCHLOROTHIAZIDE 25 MG PO TABS: 25.0000 mg | ORAL_TABLET | Freq: Every day | ORAL | Status: DC

## 2016-03-29 MED ORDER — PRAVASTATIN SODIUM 40 MG PO TABS: 40.0000 mg | ORAL_TABLET | Freq: Every day | ORAL | Status: DC

## 2016-03-29 MED ORDER — AMLODIPINE BESYLATE 5 MG PO TABS: 5.0000 mg | ORAL_TABLET | Freq: Every day | ORAL | Status: DC

## 2016-03-29 MED ORDER — HYDROCODONE-ACETAMINOPHEN 5-325 MG PO TABS: 1.0000 | ORAL_TABLET | ORAL | Status: DC | PRN

## 2016-03-29 MED ORDER — NITROGLYCERIN 0.4 MG SL SUBL: 0.4000 mg | SUBLINGUAL_TABLET | SUBLINGUAL | Status: DC | PRN

## 2016-03-29 MED ORDER — TICAGRELOR 90 MG PO TABS: 90.0000 mg | ORAL_TABLET | Freq: Two times a day (BID) | ORAL | Status: DC

## 2016-03-29 NOTE — Patient Instructions (Signed)
Medication Instructions:  Your physician has recommended you make the following change in your medication:  1) START Norvasc 5mg  tablet by mouth ONCE daily   Labwork: none  Testing/Procedures: none  Follow-Up: Your physician recommends that you schedule a follow-up appointment in: 6-8 weeks with Dr. Eden Emms   Any Other Special Instructions Will Be Listed Below (If Applicable).     If you need a refill on your cardiac medications before your next appointment, please call your pharmacy.

## 2016-03-29 NOTE — Progress Notes (Signed)
Cardiology Office Note   Date:  03/29/2016   ID:  Russell Carter, Russell Carter December 19, 1955, MRN [id number]  PCP:  [facility]  Cardiologist:  Dr. Eden Emms    Chief Complaint  Patient presents with  . Hospitalization Follow-up      History of Present Illness: Russell Carter is a 60 y.o. male who presents for post hospital visit for neck and chest pain.     He has a past medical history of moderate non-obstructive CAD by cath in 2011, HTN, HLD, and TIA who presented to [facility] ED on 03/13/2016 for evaluation of chest pain.   Reported developing a mid-sternal crushing pain the day prior to admit which was transient but associated with dyspnea and diaphoresis.   During hospitalization he had episodes of CHB and hypotension.  Cardiac enzymes were negative.   MRI had also been performed due to his numbness in his arms and legs and pain with movement,  which showed significant cervical stenosis of C1-T1.   Neurosurgery was consulted and recommended follow-up in clinic after cardiac clearance was obtained for likely C3-T1 laminectomy and fusion.   With symptomatic CHB he had cardiac cath and found to have 95% RCA.   Promus DES stent 2.5 mm x 20 mm (2.8 mm), there was a 0% residual stenosis. LV function was normal. He was started on DAPT with ASA and Brilinta.  Dr. Herbie Baltimore made mention that, "based on pain trial data, we can consider potentially holding Brilinta for possible surgery in 6 months. Best option would be to bridge with Aggrastat, however if bleeding this would be too high, I think in 6 months he would be fine stopping Brilinta for 5-7 days preoperatively."  D/c'd the 27th.    Today  occ SOB with exertion with mowing the grass.  His BP is significantly elevated. He takes his meds appropriately.  Has not missed ASA and Brilinta.      Past Medical History  Diagnosis Date  . Hyperglycemia   . Gout, unspecified   . Hypercholesterolemia   . HTN (hypertension)   . Blood in stool   .  Migraines   . Stroke (HCC)   . Arthritis   . Pneumonia   . Depression   . Cocaine abuse     history  . Headache   . Diabetes mellitus without complication (HCC)   . Renal insufficiency   . Anginal pain (HCC)   . Myocardial infarction [facility]) 2008    drug cocaine and marijuana at time of mi none  since    . CAD (coronary artery disease)     a. 2011: cath showing mod non-obstructive disease b. 02/2016: cath with 95% stenosis in the Mid RCA. DES placed.  . Cervical stenosis of spine     Past Surgical History  Procedure Laterality Date  . Shoulder open rotator cuff repair Left 2010  . Reconstruct / stabilize distal ulna    . Inguinal hernia repair  05/12/2012    Procedure: HERNIA REPAIR INGUINAL ADULT;  Surgeon: Liz Malady, MD;  Location: [facility];  Service: General;  Laterality: Right;  . Eye surgery Bilateral     "for double vision"  . Thumb fusion Right   . Inguinal hernia repair Left 02/02/2003    Dr. Violeta Gelinas. repair with mesh. Same procedure done again in  05/22/2004   . Inguinal hernia repair Left ?date 2nd OR  . Shoulder surgery Left     "injured on the job; had  to cut small bone out"  . Cardiac catheterization  2008; ~ 2011    [place]-40%lad  . Cardiac catheterization N/A 03/16/2016    Procedure: Left Heart Cath and Coronary Angiography;  Surgeon: Marykay Lex, MD;  Location: [facility] INVASIVE CV LAB;  Service: Cardiovascular;  Laterality: N/A;     Current Outpatient Prescriptions  Medication Sig Dispense Refill  . allopurinol (ZYLOPRIM) 300 MG tablet Take 1 tablet (300 mg total) by mouth daily. 30 tablet 6  . aspirin EC 81 MG EC tablet Take 1 tablet (81 mg total) by mouth daily.    Marland Kitchen CALCIUM-VITAMIN D PO Take 1 tablet by mouth daily.    . cyclobenzaprine (FLEXERIL) 10 MG tablet Take 1 tablet (10 mg total) by mouth 3 (three) times daily as needed. For muscle spasms 30 tablet 0  . diazepam (VALIUM) 5 MG tablet Take 1 tablet (5 mg total) by mouth  every 6 (six) hours as needed for muscle spasms. 20 tablet 0  . hydrochlorothiazide (HYDRODIURIL) 25 MG tablet Take 1 tablet (25 mg total) by mouth daily. 30 tablet 11  . HYDROcodone-acetaminophen (NORCO/VICODIN) 5-325 MG tablet Take 1-2 tablets by mouth every 4 (four) hours as needed for moderate pain or severe pain. 30 tablet 0  . Iron-Vitamins (GERITOL PO) Take 2 tablets by mouth daily.    Marland Kitchen losartan (COZAAR) 100 MG tablet Take 1 tablet (100 mg total) by mouth daily. 30 tablet 11  . meclizine (ANTIVERT) 25 MG tablet Take 1 tablet (25 mg total) by mouth 3 (three) times daily as needed. For vertigo 30 tablet 6  . nitroGLYCERIN (NITROSTAT) 0.4 MG SL tablet Place 1 tablet (0.4 mg total) under the tongue every 5 (five) minutes as needed for chest pain. 25 tablet 4  . pravastatin (PRAVACHOL) 40 MG tablet Take 1 tablet (40 mg total) by mouth daily. 30 tablet 11  . ticagrelor (BRILINTA) 90 MG TABS tablet Take 1 tablet (90 mg total) by mouth 2 (two) times daily. 60 tablet 11  . amLODipine (NORVASC) 5 MG tablet Take 1 tablet (5 mg total) by mouth daily. 30 tablet 11  . [DISCONTINUED] famotidine (PEPCID) 20 MG tablet Take 1 tablet (20 mg total) by mouth 2 (two) times daily. 30 tablet 0  . [DISCONTINUED] TACRINE HYDROCHLORIDE PO Take 25 mg by mouth daily.       No current facility-administered medications for this visit.    Allergies:   Shellfish allergy    Social History:  The patient  reports that he has never smoked. He has never used smokeless tobacco. He reports that he drinks about 13.8 oz of alcohol per week. He reports that he uses illicit drugs ("Crack" cocaine, Marijuana, and Cocaine).   Family History:  The patient's family history includes Heart attack in his brother, father, and mother.    ROS:  General:no colds or fevers, no weight changes Skin:no rashes or ulcers HEENT:no blurred vision, no congestion CV:see HPI PUL:see HPI GI:no diarrhea constipation or melena, no  indigestion GU:no hematuria, no dysuria MS:no joint pain, no claudication Neuro:no syncope, no lightheadedness Endo:no diabetes, no thyroid disease Wt Readings from Last 3 Encounters:  03/29/16 195 lb 1.9 oz (88.506 kg)  03/17/16 205 lb 4 oz (93.1 kg)  09/12/15 192 lb (87.091 kg)     PHYSICAL EXAM: VS:  BP 156/104 mmHg  Pulse 88  Ht 5\' 11"  (1.803 m)  Wt 195 lb 1.9 oz (88.506 kg)  BMI 27.23 kg/m2  SpO2 99% , BMI Body mass  index is 27.23 kg/(m^2). General:Pleasant affect, NAD Skin:Warm and dry, brisk capillary refill HEENT:normocephalic, sclera clear, mucus membranes moist Neck:supple, no JVD, no bruits  Heart:S1S2 RRR without murmur, gallup, rub or click Lungs:clear without rales, rhonchi, or wheezes ZOX:WRUE, non tender, + BS, do not palpate liver spleen or masses Ext:no lower ext edema, 2+ pedal pulses, 2+ radial pulses Neuro:alert and oriented, MAE, follows commands, + facial symmetry    EKG:  EKG is ordered today. The ekg ordered today demonstrates SR at 83, no acute changes.     Recent Labs: 03/13/2016: ALT 37; Magnesium 2.0; TSH 2.215 03/17/2016: BUN 10; Creatinine, Ser 1.22; Hemoglobin 12.8*; Platelets 163; Potassium 3.4*; Sodium 136    Lipid Panel    Component Value Date/Time   CHOL * 01/22/2011 0512    250        ATP III CLASSIFICATION:  <200     mg/dL   Desirable  454-098  mg/dL   Borderline High  >=119    mg/dL   High          TRIG 83 01/22/2011 0512   HDL 76 01/22/2011 0512   CHOLHDL 3.3 01/22/2011 0512   VLDL 17 01/22/2011 0512   LDLCALC * 01/22/2011 0512    157        Total Cholesterol/HDL:CHD Risk Coronary Heart Disease Risk Table                     Men   Women  1/2 Average Risk   3.4   3.3  Average Risk       5.0   4.4  2 X Average Risk   9.6   7.1  3 X Average Risk  23.4   11.0        Use the calculated Patient Ratio above and the CHD Risk Table to determine the patient's CHD Risk.        ATP III CLASSIFICATION (LDL):  <100     mg/dL    Optimal  147-829  mg/dL   Near or Above                    Optimal  130-159  mg/dL   Borderline  562-130  mg/dL   High  >865     mg/dL   Very High       Other studies Reviewed: Additional studies/ records that were reviewed today include:   CARDIAC CATH: Conclusion    1. Mid RCA lesion, 95% stenosed. Post intervention with a Promus DES stent 2.5 mm x 20 mm (2.8 mm), there is a 0% residual stenosis. 2. The left ventricular systolic function is normal. 3. Otherwise minimal CAD  Severe single vessel disease involving the mid RCA treated with a DES stent. Due to the length of lesion and initial potential size of the vessel I chose to use a DES stent in this patient with diabetes.  He does have severe cervical spine disease that will likely need to be treated. Based on your pain trial data, we can consider potentially holding Brilinta for possible surgery in 6 months. Best option would be to bridge with Aggrastat, however if bleeding this would be too high, I think it 6 months he would be fine stopping Brilinta for 5-7 days preoperatively.  Plan:  Transfer to 6 central post procedure unit.  TR band removal per protocol.  On aspirin plus Brilinta for a year, but okay to potentially suspend Brilinta for 5-7 days preoperatively after  roughly 6 months.  Continue aggressive risk modification.  Counseling reference absence of cocaine use.       ASSESSMENT AND PLAN:  1.  Chest pain with combination of cervical spinal stenosis and unstable angina.  Angina resolved, except SOB with heavy exertion.  May be due to HTN.    2.  CAD with stent to RCA for 95% blockage. He will follow up with Dr. Eden Emms in 6 weeks or so.  He will need DAPT for 1 year , after 6 months per Dr. Herbie Baltimore he may be able to come off for surgery.  This will be decided by Dr. Eden Emms.   Hx CAD-50% LAD 2011  3.  Essential hypertension- uncontrolled today. Will add amlodipine at 5 mg will recheck BP next week.     4.  Complete heart block (HCC) most likely related to his RCA ischemia.  We are holding BB.  5.  Cervical spinal stenosis to see Neurologist Tuesday - I refilled his Vicodin for 30 pills no refills, he understands pain meds need to come from neurology.  6. Dyslipidemia on statin refilled.      Current medicines are reviewed with the patient today.  The patient Has no concerns regarding medicines.  The following changes have been made:  See above Labs/ tests ordered today include:see above  Disposition:   FU:  see above  Signed, Nada Boozer, NP  03/29/2016 5:10 PM    [facility] [address] [place], [place], [place]  [address]/ [address] [place], [place] Phone: [phone]; Fax: [phone]  [phone]

## 2016-03-30 NOTE — Addendum Note (Signed)
Addended by: Leone Brand on: 03/30/2016 10:51 AM   Modules accepted: Level of Service

## 2016-04-04 ENCOUNTER — Other Ambulatory Visit: Payer: Self-pay | Admitting: Neurological Surgery

## 2016-04-11 ENCOUNTER — Telehealth: Payer: Self-pay | Admitting: Cardiovascular Disease

## 2016-04-11 NOTE — Telephone Encounter (Signed)
Patient was calling about his surgical clearance, to see if it had been sent back. Faxed surgical clearance back to [facility] at the beginning of the week. Per Dr. Eden Emms, not clear just had stent 03/16/16, November will be 6 months. See media tab in chart to see scanned copy.  Informed patient of Dr. Fabio Bering response. Patient verbalized understanding.

## 2016-04-11 NOTE — Telephone Encounter (Signed)
NEw Message  Pt requested to speak w/ RN- did not specify nature of phone call. Please call back and discuss.

## 2016-04-11 NOTE — Telephone Encounter (Signed)
Left message to call back  

## 2016-04-11 NOTE — Telephone Encounter (Signed)
Follow-up   The is returning the pt's call

## 2016-04-17 ENCOUNTER — Encounter (HOSPITAL_COMMUNITY): Payer: Self-pay

## 2016-04-17 ENCOUNTER — Inpatient Hospital Stay (HOSPITAL_COMMUNITY): Admit: 2016-04-17 | Payer: Medicaid Other | Admitting: Neurological Surgery

## 2016-04-17 SURGERY — POSTERIOR CERVICAL FUSION/FORAMINOTOMY LEVEL 5
Anesthesia: General

## 2016-05-03 ENCOUNTER — Other Ambulatory Visit: Payer: Self-pay | Admitting: Student

## 2016-05-27 ENCOUNTER — Other Ambulatory Visit: Payer: Self-pay | Admitting: Student

## 2016-05-28 NOTE — Telephone Encounter (Signed)
Pharmacy requesting a refill on cyclobenzaprine 10 mg tablet. [place] prescribed this medication. Would you like to refill? Please advise

## 2016-05-29 NOTE — Telephone Encounter (Signed)
Will forward to Dr. Eden Emms to see if he wants to continue refilling Flexeril.

## 2016-06-04 NOTE — Progress Notes (Signed)
Cardiology Office Note   Date:  06/05/2016   ID:  [name], Bills Feb 09, 1956, MRN [id number]  PCP:  [facility]  Cardiologist:  Dr. Eden Emms    Chief Complaint  Patient presents with  . Coronary Artery Disease    some CP about a week ago, none since, per pt      History of Present Illness: Russell Carter is a 60 y.o. male who presents for post hospital visit for neck and chest pain.     He has a past medical history of moderate non-obstructive CAD by cath in 2011, HTN, HLD, and TIA who presented to [facility] ED on 03/13/2016 for evaluation of chest pain.   Reported developing a mid-sternal crushing pain the day prior to admit which was transient but associated with dyspnea and diaphoresis.   During hospitalization he had episodes of CHB and hypotension.  Cardiac enzymes were negative.   MRI had also been performed due to his numbness in his arms and legs and pain with movement,  which showed significant cervical stenosis of C11-T1.   Neurosurgery was consulted and recommended follow-up in clinic after cardiac clearance was obtained for likely C3-T1 laminectomy and fusion.   With symptomatic CHB he had cardiac cath and found to have 95% RCA.   Promus DES stent 2.5 mm x 20 mm (2.8 mm), there was a 0% residual stenosis. LV function was normal. He was started on DAPT with ASA and Brilinta.  Dr. Herbie Baltimore made mention that, "based on pain trial data, we can consider potentially holding Brilinta for possible surgery in 6 months. Best option would be to bridge with Aggrastat, however if bleeding this would be too high, I think in 6 months he would be fine stopping Brilinta for 5-7 days preoperatively."  D/c'd the 27th.      Has not missed ASA and Brilinta.      Past Medical History:  Diagnosis Date  . Anginal pain (HCC)   . Arthritis   . Blood in stool   . CAD (coronary artery disease)    a. 2011: cath showing mod non-obstructive disease b. 02/2016: cath with 95% stenosis in  the Mid RCA. DES placed.  . Cervical stenosis of spine   . Cocaine abuse    history  . Depression   . Diabetes mellitus without complication (HCC)   . Gout, unspecified   . Headache   . HTN (hypertension)   . Hypercholesterolemia   . Hyperglycemia   . Migraines   . Myocardial infarction [facility]) 2008   drug cocaine and marijuana at time of mi none  since    . Pneumonia   . Renal insufficiency   . Stroke [facility]     Past Surgical History:  Procedure Laterality Date  . CARDIAC CATHETERIZATION  2008; ~ 2011   [place]-40%lad  . CARDIAC CATHETERIZATION N/A 03/16/2016   Procedure: Left Heart Cath and Coronary Angiography;  Surgeon: Marykay Lex, MD;  Location: [facility] INVASIVE CV LAB;  Service: Cardiovascular;  Laterality: N/A;  . EYE SURGERY Bilateral    "for double vision"  . INGUINAL HERNIA REPAIR  05/12/2012   Procedure: HERNIA REPAIR INGUINAL ADULT;  Surgeon: Liz Malady, MD;  Location: [facility];  Service: General;  Laterality: Right;  . INGUINAL HERNIA REPAIR Left 02/02/2003   Dr. Violeta Gelinas. repair with mesh. Same procedure done again in  05/22/2004   . INGUINAL HERNIA REPAIR Left ?date 2nd OR  . RECONSTRUCT / STABILIZE DISTAL  [facility]    . SHOULDER OPEN ROTATOR CUFF REPAIR Left 2010  . SHOULDER SURGERY Left    "injured on the job; had to cut small bone out"  . THUMB FUSION Right      Current Outpatient Prescriptions  Medication Sig Dispense Refill  . allopurinol (ZYLOPRIM) 300 MG tablet Take 1 tablet (300 mg total) by mouth daily. 30 tablet 6  . amLODipine (NORVASC) 5 MG tablet Take 1 tablet (5 mg total) by mouth daily. 30 tablet 11  . aspirin EC 81 MG EC tablet Take 1 tablet (81 mg total) by mouth daily.    Marland Kitchen CALCIUM-VITAMIN D PO Take 1 tablet by mouth daily.    . cyclobenzaprine (FLEXERIL) 10 MG tablet TAKE ONE TABLET BY MOUTH THREE TIMES DAILY AS NEEDED FOR MUSCLE SPASMS 30 tablet 0  . diazepam (VALIUM) 5 MG tablet Take 1 tablet (5 mg total) by mouth  every 6 (six) hours as needed for muscle spasms. 20 tablet 0  . GABAPENTIN PO Take 3 tablets by mouth daily.    . hydrochlorothiazide (HYDRODIURIL) 25 MG tablet Take 1 tablet (25 mg total) by mouth daily. 30 tablet 11  . HYDROcodone-acetaminophen (NORCO/VICODIN) 5-325 MG tablet Take 1-2 tablets by mouth every 4 (four) hours as needed for moderate pain or severe pain. 30 tablet 0  . Iron-Vitamins (GERITOL PO) Take 2 tablets by mouth daily.    Marland Kitchen losartan (COZAAR) 100 MG tablet Take 1 tablet (100 mg total) by mouth daily. 30 tablet 11  . meclizine (ANTIVERT) 25 MG tablet Take 1 tablet (25 mg total) by mouth 3 (three) times daily as needed. For vertigo 30 tablet 6  . nitroGLYCERIN (NITROSTAT) 0.4 MG SL tablet Place 1 tablet (0.4 mg total) under the tongue every 5 (five) minutes as needed for chest pain. 25 tablet 4  . pravastatin (PRAVACHOL) 40 MG tablet Take 1 tablet (40 mg total) by mouth daily. 30 tablet 11  . ticagrelor (BRILINTA) 90 MG TABS tablet Take 1 tablet (90 mg total) by mouth 2 (two) times daily. 60 tablet 11   No current facility-administered medications for this visit.     Allergies:   Shellfish allergy    Social History:  The patient  reports that he has never smoked. He has never used smokeless tobacco. He reports that he drinks about 13.8 oz of alcohol per week . He reports that he uses drugs, including "Crack" cocaine, Marijuana, and Cocaine.   Family History:  The patient's family history includes Heart attack in his brother, father, and mother.    ROS:  General:no colds or fevers, no weight changes Skin:no rashes or ulcers HEENT:no blurred vision, no congestion CV:see HPI PUL:see HPI GI:no diarrhea constipation or melena, no indigestion GU:no hematuria, no dysuria MS:no joint pain, no claudication Neuro:no syncope, no lightheadedness Endo:no diabetes, no thyroid disease Wt Readings from Last 3 Encounters:  06/05/16 193 lb 6.4 oz (87.7 kg)  03/29/16 195 lb 1.9 oz  (88.5 kg)  03/17/16 205 lb 4 oz (93.1 kg)     PHYSICAL EXAM: VS:  BP (!) 144/86 (BP Location: Left Arm, Patient Position: Sitting, Cuff Size: Normal)   Pulse 61   Ht 5\' 11"  (1.803 m)   Wt 193 lb 6.4 oz (87.7 kg)   SpO2 97%   BMI 26.97 kg/m  , BMI Body mass index is 26.97 kg/m. General:Pleasant affect, NAD Skin:Warm and dry, brisk capillary refill HEENT:normocephalic, sclera clear, mucus membranes moist Neck:supple, no JVD, no bruits  Heart:S1S2  RRR without murmur, gallup, rub or click Lungs:clear without rales, rhonchi, or wheezes PVG:KKDP, non tender, + BS, do not palpate liver spleen or masses Ext:no lower ext edema, 2+ pedal pulses, 2+ radial pulses Neuro:alert and oriented, MAE, follows commands, + facial symmetry    EKG:  EKG is ordered today. The ekg ordered today demonstrates SR at 83, no acute changes.     Recent Labs: 03/13/2016: ALT 37; Magnesium 2.0; TSH 2.215 03/17/2016: BUN 10; Creatinine, Ser 1.22; Hemoglobin 12.8; Platelets 163; Potassium 3.4; Sodium 136    Lipid Panel    Component Value Date/Time   CHOL (H) 01/22/2011 0512    250        ATP III CLASSIFICATION:  <200     mg/dL   Desirable  947-076  mg/dL   Borderline High  >=151    mg/dL   High          TRIG 83 01/22/2011 0512   HDL 76 01/22/2011 0512   CHOLHDL 3.3 01/22/2011 0512   VLDL 17 01/22/2011 0512   LDLCALC (H) 01/22/2011 0512    157        Total Cholesterol/HDL:CHD Risk Coronary Heart Disease Risk Table                     Men   Women  1/2 Average Risk   3.4   3.3  Average Risk       5.0   4.4  2 X Average Risk   9.6   7.1  3 X Average Risk  23.4   11.0        Use the calculated Patient Ratio above and the CHD Risk Table to determine the patient's CHD Risk.        ATP III CLASSIFICATION (LDL):  <100     mg/dL   Optimal  834-373  mg/dL   Near or Above                    Optimal  130-159  mg/dL   Borderline  578-978  mg/dL   High  >478     mg/dL   Very High       Other  studies Reviewed: Additional studies/ records that were reviewed today include:   CARDIAC CATH: Conclusion    1. Mid RCA lesion, 95% stenosed. Post intervention with a Promus DES stent 2.5 mm x 20 mm (2.8 mm), there is a 0% residual stenosis. 2. The left ventricular systolic function is normal. 3. Otherwise minimal CAD  Severe single vessel disease involving the mid RCA treated with a DES stent. Due to the length of lesion and initial potential size of the vessel I chose to use a DES stent in this patient with diabetes.  He does have severe cervical spine disease that will likely need to be treated. Based on your pain trial data, we can consider potentially holding Brilinta for possible surgery in 6 months. Best option would be to bridge with Aggrastat, however if bleeding this would be too high, I think it 6 months he would be fine stopping Brilinta for 5-7 days preoperatively.  Plan:  Transfer to 6 central post procedure unit.  TR band removal per protocol.  On aspirin plus Brilinta for a year, but okay to potentially suspend Brilinta for 5-7 days preoperatively after roughly 6 months.  Continue aggressive risk modification.  Counseling reference absence of cocaine use.       ASSESSMENT AND PLAN:  1.  Chest pain with combination of cervical spinal stenosis and unstable angina.  Angina resolved, except SOB with heavy exertion.  May be due to HTN.    2.  CAD with stent to RCA for 95% blockage. This was with DES No neurosurgery Or holding DAT until at least November unless emergent/unstable neuro signs  3.  Essential hypertension- imporved with addition of amlodipine gave him samples Of Azor today as he has run out of meds and son won't pick up scripts for 3 days   4.  Complete heart block (HCC) most likely related to his RCA ischemia.  We are holding BB.  5.  Cervical spinal stenosis  MRI 5/23 with multi level foraminal stenosis extending from C3-T1  6. Dyslipidemia  on statin refilled.      Current medicines are reviewed with the patient today.  The patient Has no concerns regarding medicines.  The following changes have been made:  See above Labs/ tests ordered today include:see above  Disposition:   FU:  see above  Charlton HawsPeter Jadan Carter

## 2016-06-05 ENCOUNTER — Encounter: Payer: Self-pay | Admitting: Cardiovascular Disease

## 2016-06-05 ENCOUNTER — Encounter (INDEPENDENT_AMBULATORY_CARE_PROVIDER_SITE_OTHER): Payer: Self-pay

## 2016-06-05 ENCOUNTER — Ambulatory Visit (INDEPENDENT_AMBULATORY_CARE_PROVIDER_SITE_OTHER): Payer: Medicaid Other | Admitting: Cardiovascular Disease

## 2016-06-05 VITALS — BP 144/86 | HR 61 | Ht 71.0 in | Wt 193.4 lb

## 2016-06-05 DIAGNOSIS — I2510 Atherosclerotic heart disease of native coronary artery without angina pectoris: Secondary | ICD-10-CM | POA: Diagnosis not present

## 2016-06-05 NOTE — Patient Instructions (Signed)

## 2016-06-15 IMAGING — CR DG CHEST 2V
2 series · 2 of 2 positions shown · non-contrast
Comparison: 02/04/2015

CLINICAL DATA: Left arm pain, neck pain, dizziness and headache.

EXAM:
CHEST  2 VIEW

[chest lat]
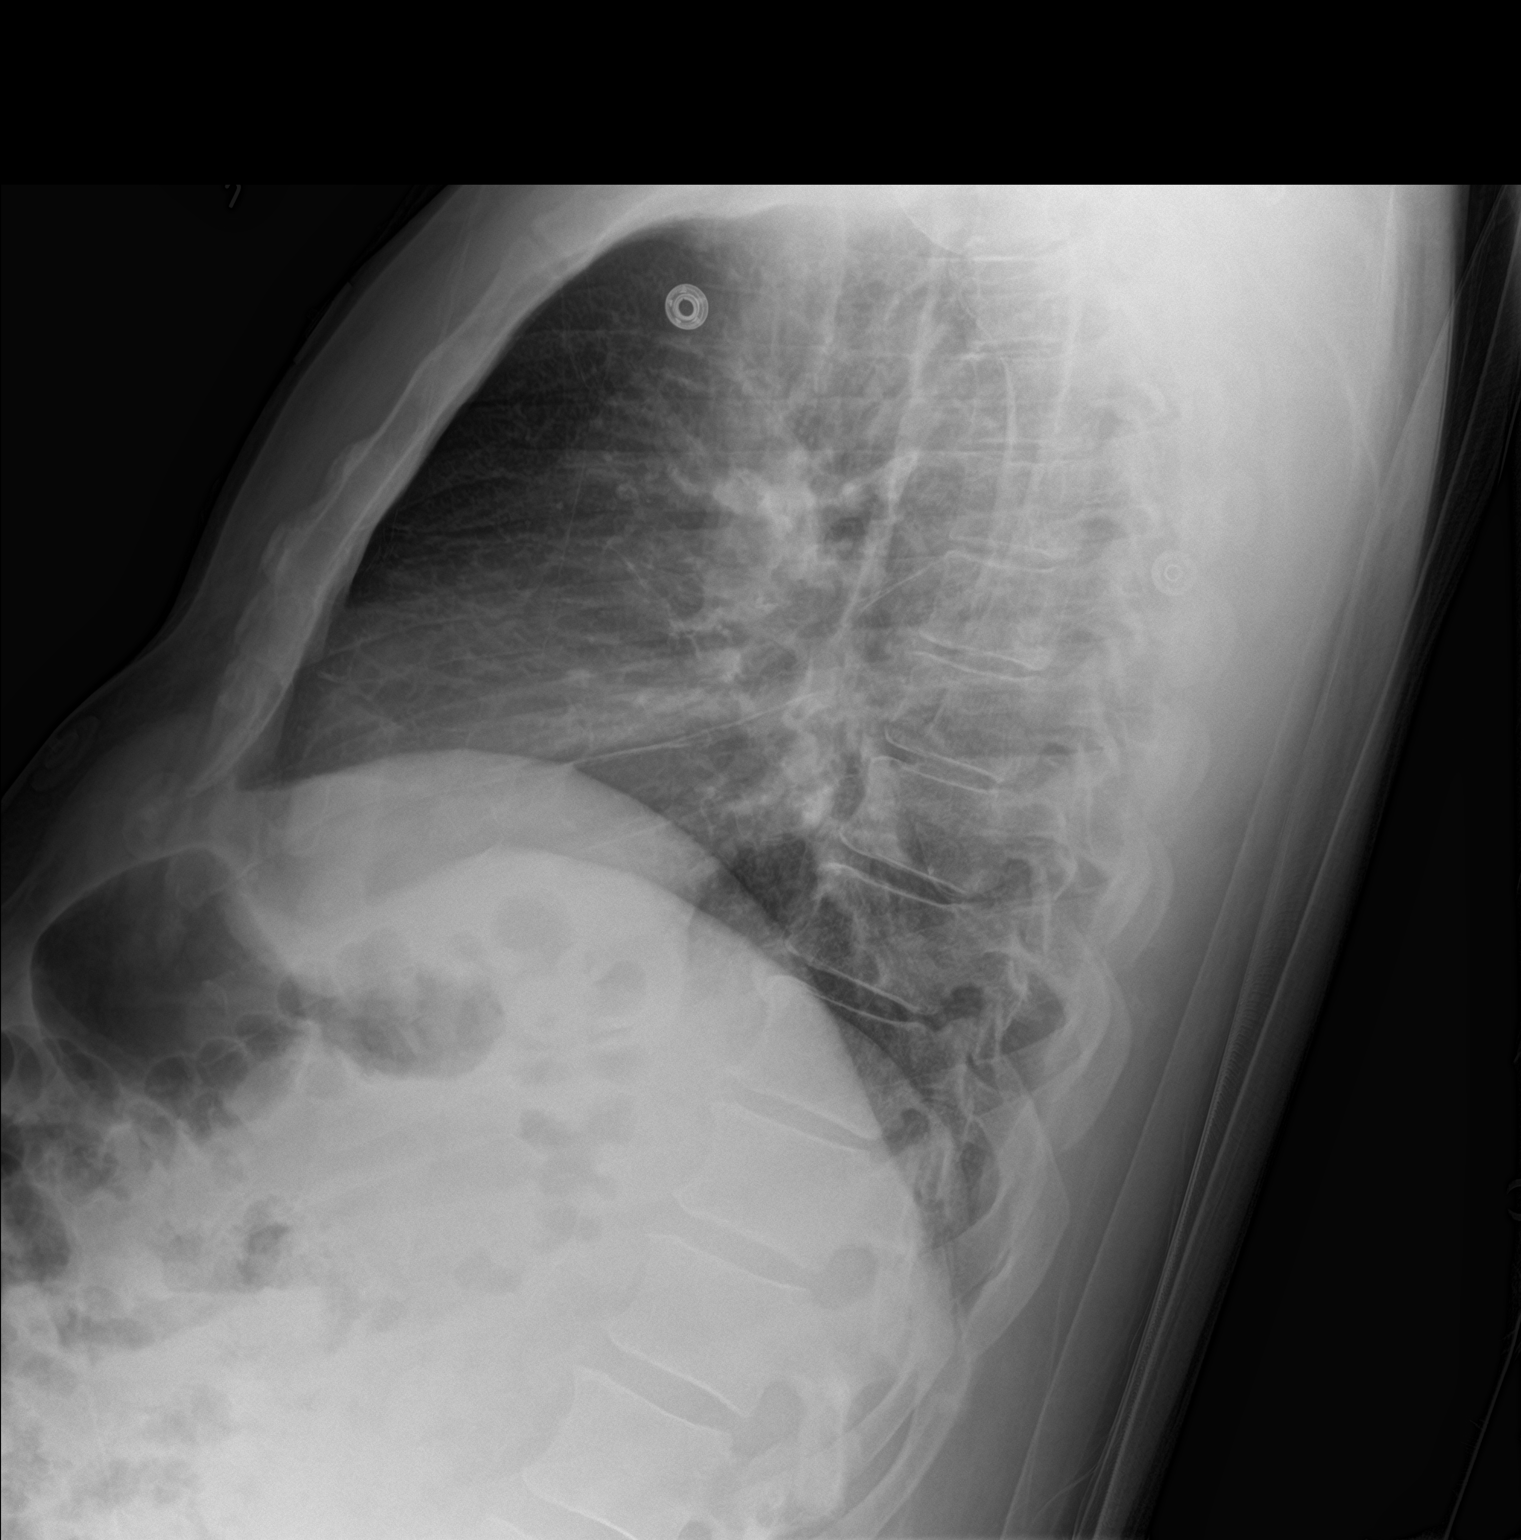

[chest ap]
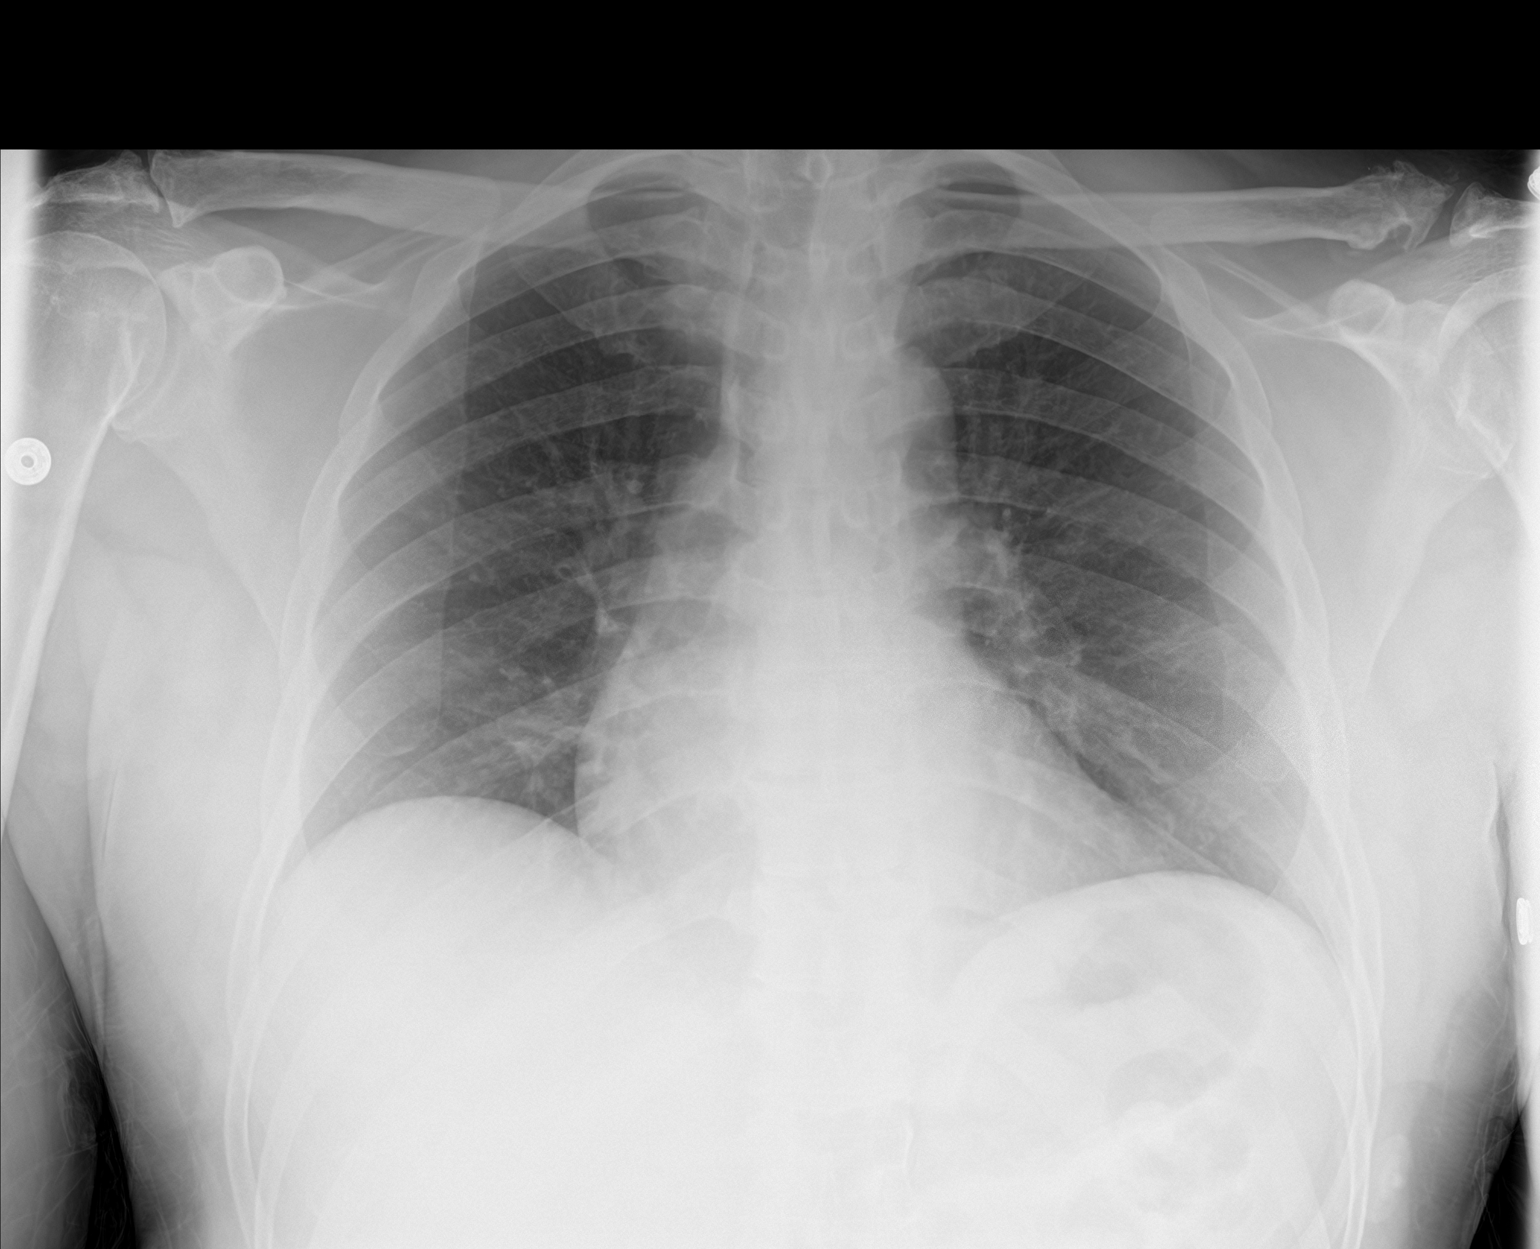

[2 of 2 positions shown; findings below may reference images not displayed]

FINDINGS: The heart size and mediastinal contours are within normal limits.
Both lungs are clear. The visualized skeletal structures are
unremarkable.
IMPRESSION: No active cardiopulmonary disease.

## 2016-06-15 IMAGING — CT CT ANGIO NECK
1 of 10 series · 6 of 46 positions shown, 11 images · IV contrast (OMNI)
Comparison: Head and cervical spine CT 03/16/2015. Head MRI/MRA
01/21/2011.

CLINICAL DATA: Head pain radiating down the left side of the face,
neck, and arm. Concern for arterial dissection.



[Series 5: carotid/brain 2.0 i30f 3 · axial · 0.49mm/px · z∈[+1294,+1530]mm · 6 of 166 slices shown, 11 images]
[im 24/166  soft-tissue]
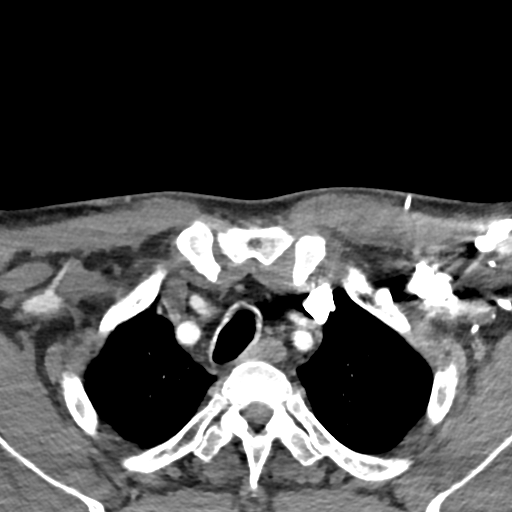
[im 24/166  bone]
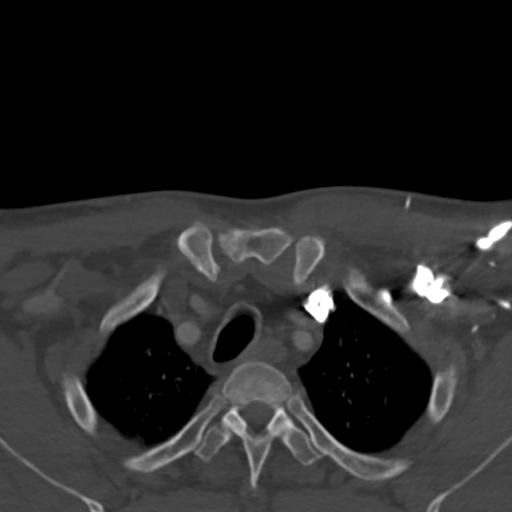
[im 48/166  soft-tissue]
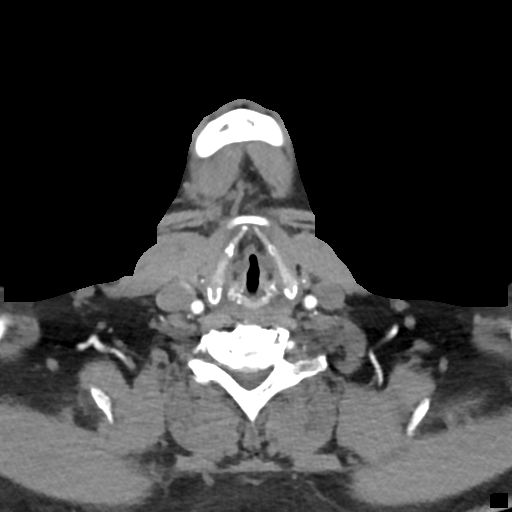
[im 71/166  soft-tissue]
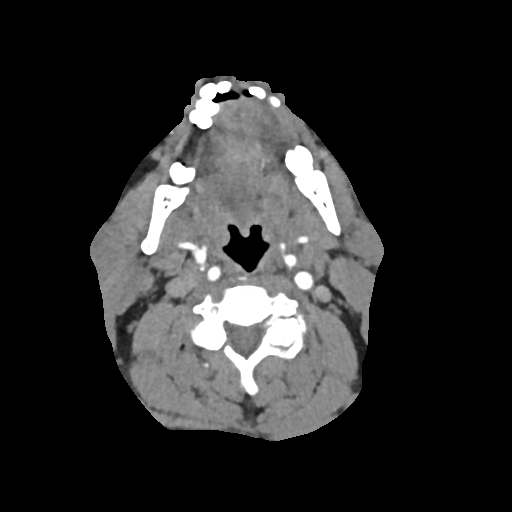
[im 71/166  lung]
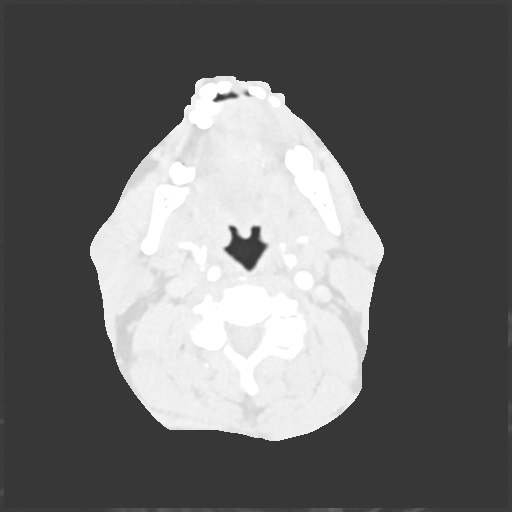
[im 95/166  soft-tissue]
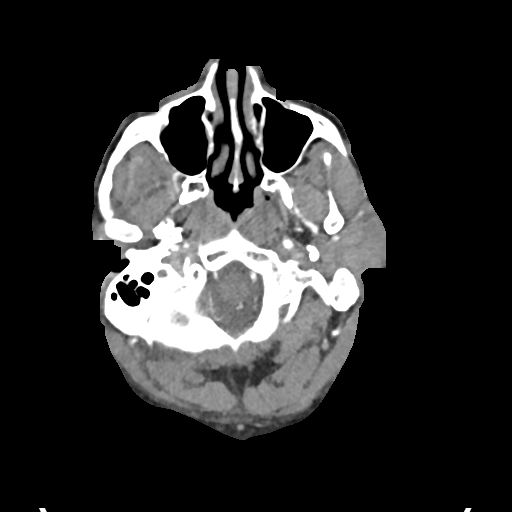
[im 95/166  lung]
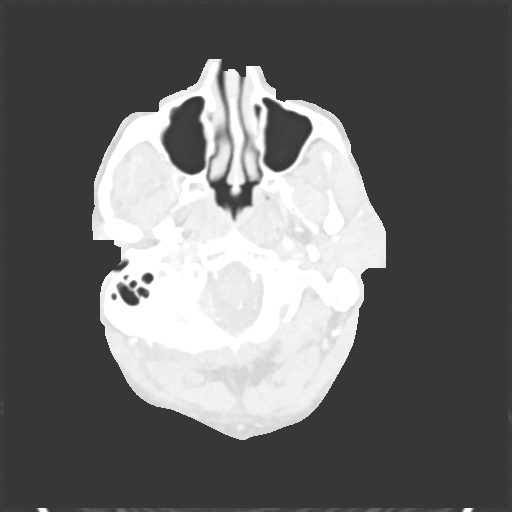
[im 118/166  soft-tissue]
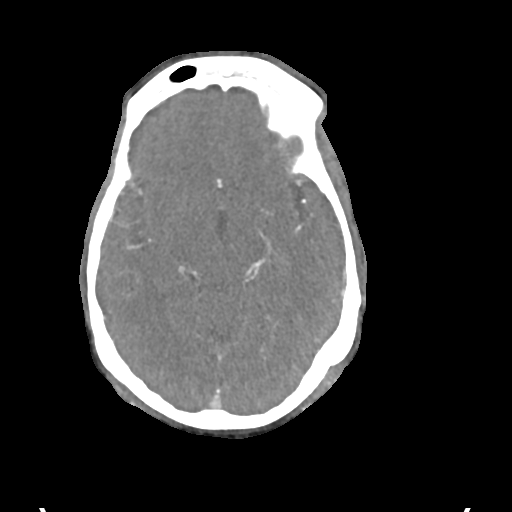
[im 118/166  lung]
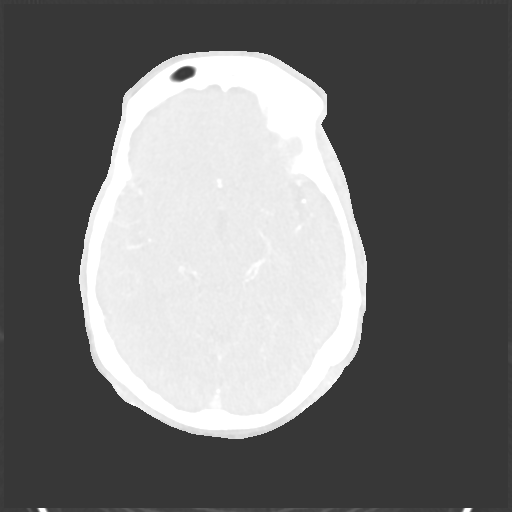
[im 142/166  soft-tissue]
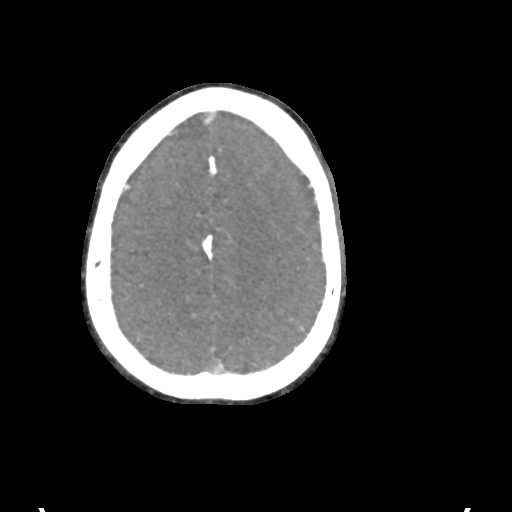
[im 142/166  lung]
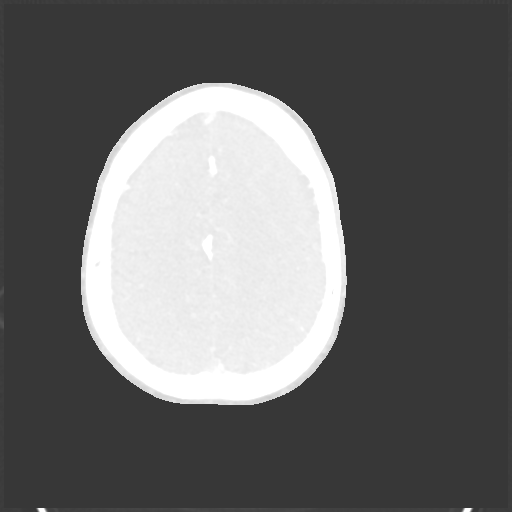

[6 of 46 positions shown; findings below may reference images not displayed]

FINDINGS: CTA NECK

Aortic arch: Normal variant aortic arch branching is noted with a
common origin of the brachiocephalic and left common carotid
arteries. Brachiocephalic and subclavian arteries are widely patent.

Right carotid system: Patent without evidence of stenosis,
dissection, or significant atherosclerosis.

Left carotid system: Patent without evidence of dissection. Mild
calcified and noncalcified plaque at the ICA origin without
stenosis.

Vertebral arteries: Patent without evidence of dissection or
significant stenosis. Left vertebral artery is dominant.

Skeleton: Advanced disc degeneration from C4-5 to C7-T1 and advanced
left-sided facet arthrosis at C3-4. There is bilateral neural
foraminal stenosis from C3-4 to C6-7, up to at least moderate in
degree.

Other neck: No neck mass.

CTA HEAD

Anterior circulation: Internal carotid arteries are patent from
skullbase to carotid termini without significant stenosis. ACAs and
MCAs are patent with mild-to-moderate branch vessel irregularity and
attenuation bilaterally but no significant proximal stenosis. No
intracranial aneurysm is identified.

Posterior circulation: Intracranial vertebral arteries are patent
with the left being dominant. The right vertebral artery is
particularly hypoplastic distal to the PICA origin. PICA and SCA
origins are patent bilaterally. Basilar artery is patent and
slightly small in caliber diffusely without a focal stenosis. There
is likely a small posterior communicating artery on the left. PCAs
are patent with mild to moderate branch vessel irregularity and
attenuation bilaterally but no significant proximal stenosis.

Venous sinuses: Patent.

Anatomic variants: None of significance.

Delayed phase: No abnormal enhancement.
IMPRESSION: 1. Patent major arterial vasculature of the neck without evidence of
dissection or stenosis.
2. No evidence of major intracranial arterial occlusion or
significant proximal stenosis. Mild-to-moderate branch vessel
irregularity and attenuation which may reflect atherosclerosis.
3. Advanced cervical disc degeneration with multilevel neural
foraminal stenosis.

## 2016-06-15 IMAGING — CT CT HEAD W/O CM
1 of 2 series · 16 of 30 positions shown, 20 images · non-contrast
Comparison: 03/16/2015

CLINICAL DATA: Head pain radiating down the left side of the neck
and arm.

EXAM:
CT HEAD WITHOUT CONTRAST
TECHNIQUE: Contiguous axial images were obtained from the base of the skull
through the vertex without intravenous contrast.

[Series 3: head 2.0 h70h · axial · 0.43mm/px · z∈[+1440,+1580]mm · 16 of 78 slices shown, 20 images]
[im 4/78  brain]
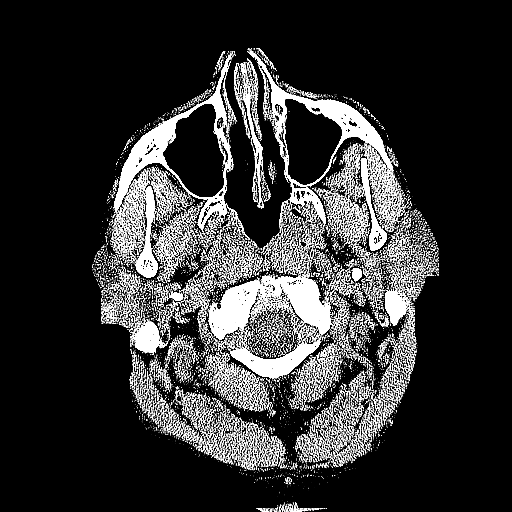
[im 4/78  bone]
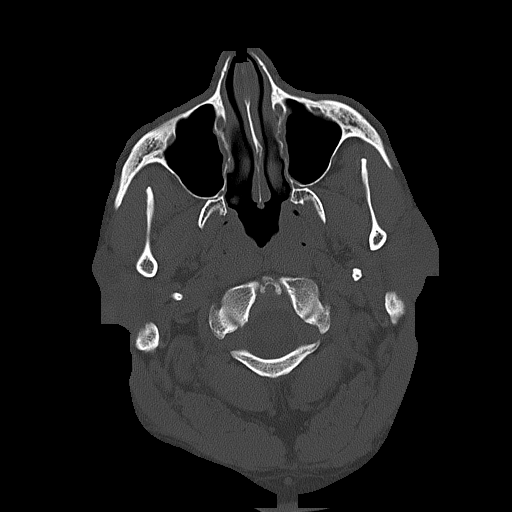
[im 8/78  brain]
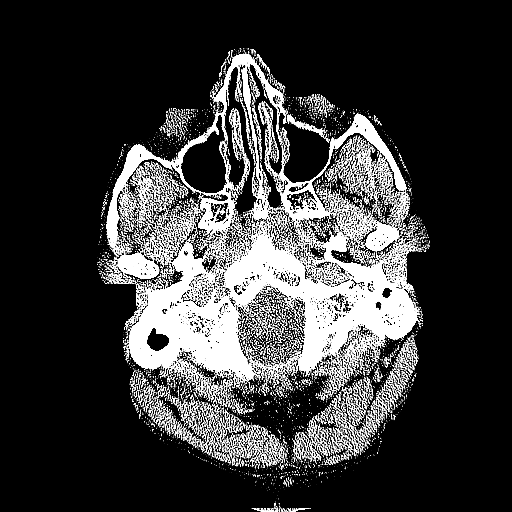
[im 12/78  brain]
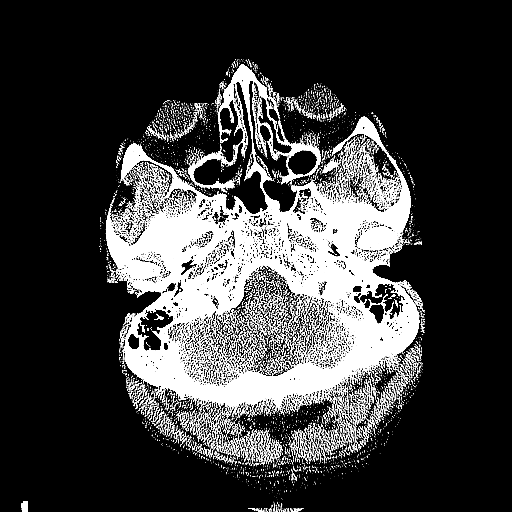
[im 20/78  brain]
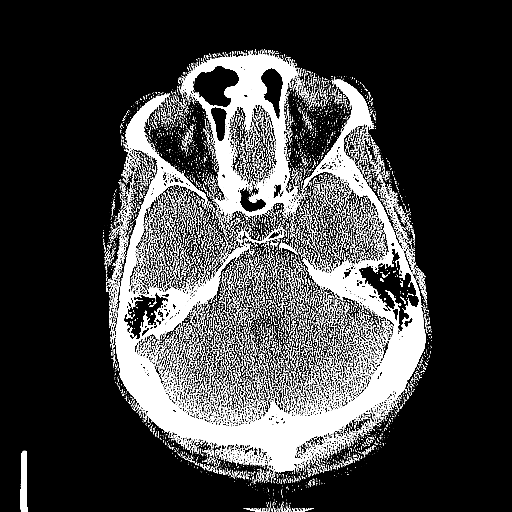
[im 24/78  brain]
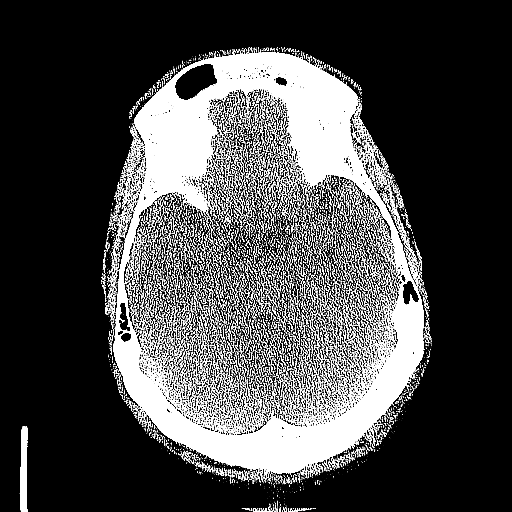
[im 24/78  bone]
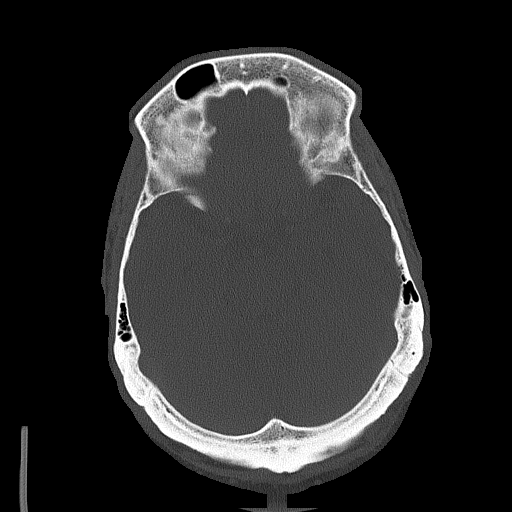
[im 27/78  brain]
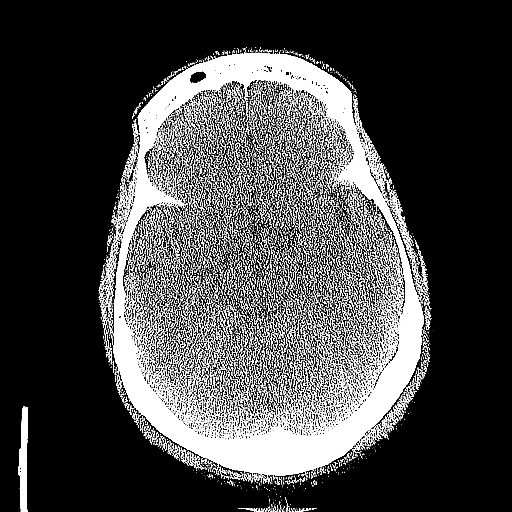
[im 31/78  brain]
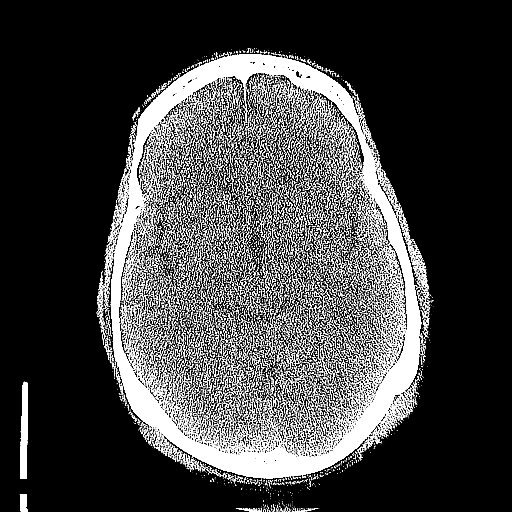
[im 35/78  brain]
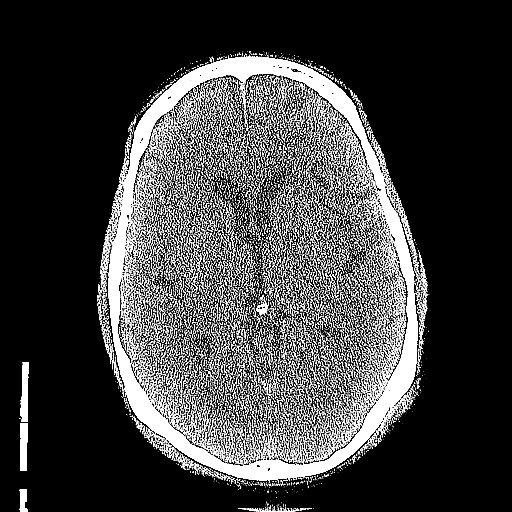
[im 43/78  brain]
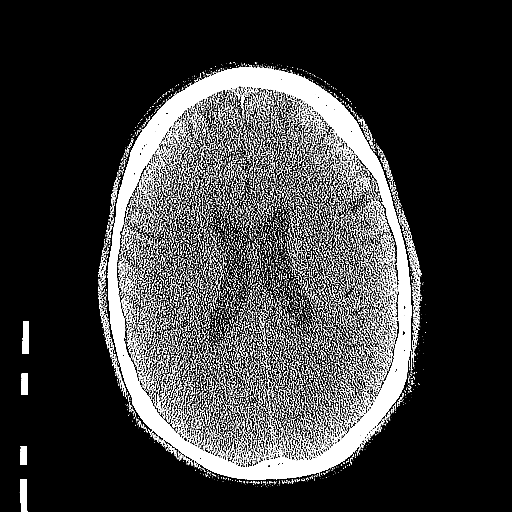
[im 43/78  bone]
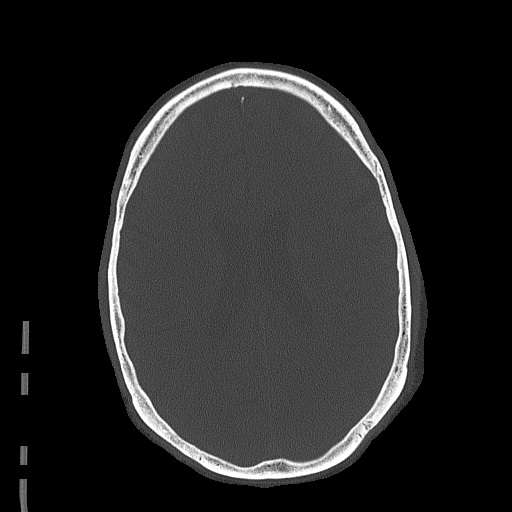
[im 47/78  brain]
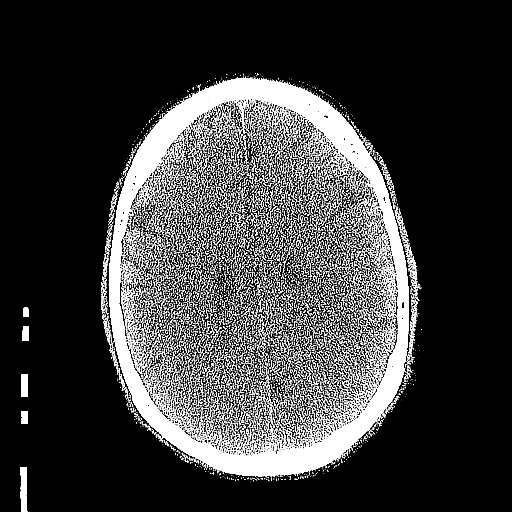
[im 51/78  brain]
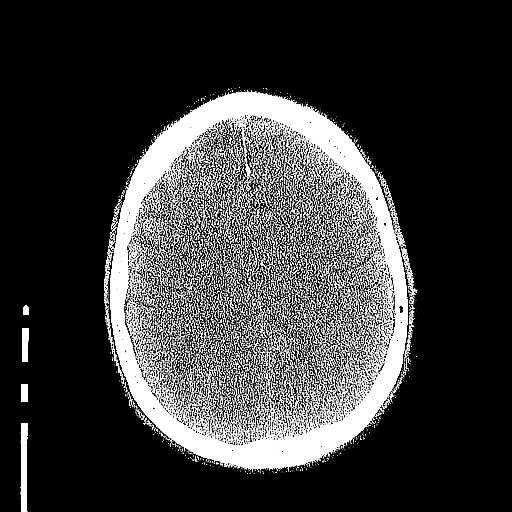
[im 54/78  brain]
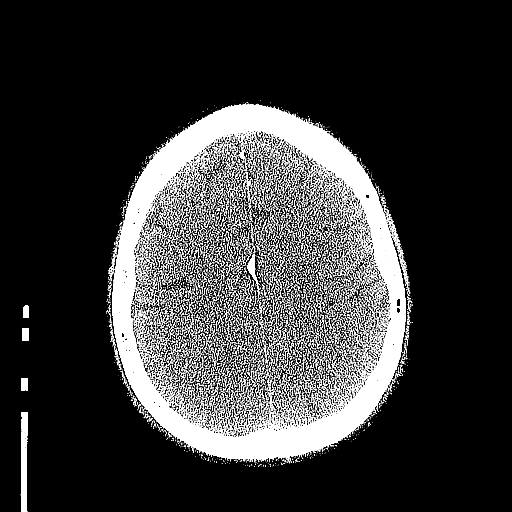
[im 58/78  brain]
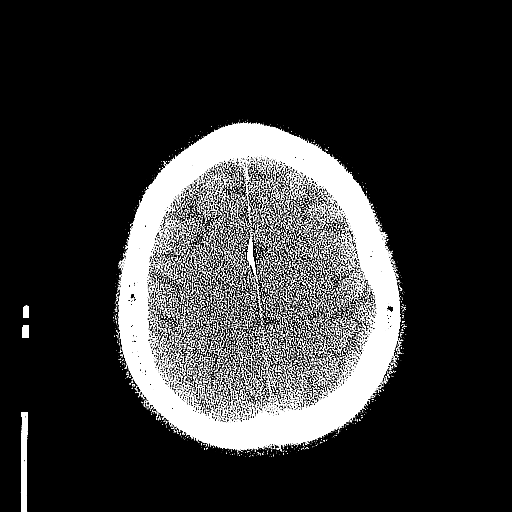
[im 58/78  bone]
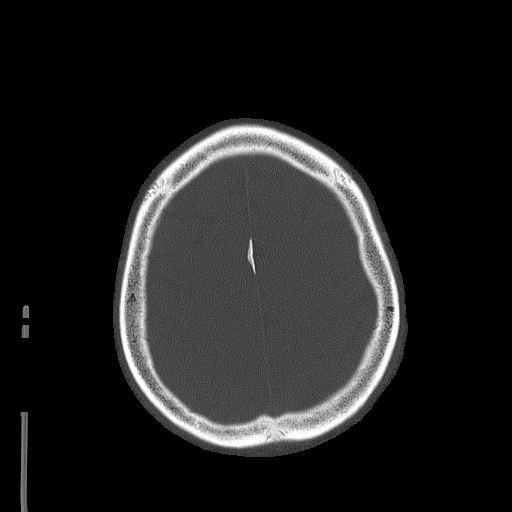
[im 66/78  brain]
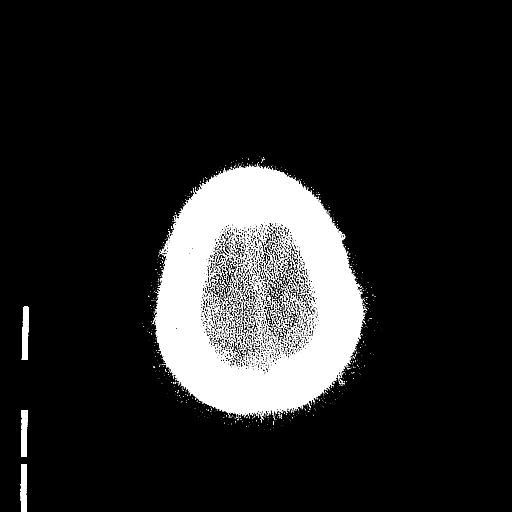
[im 70/78  brain]
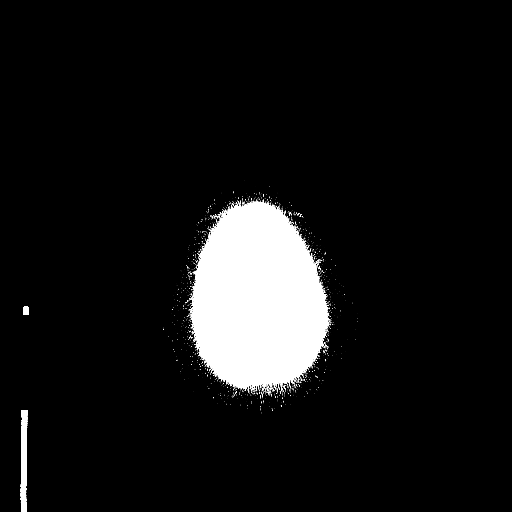
[im 74/78  brain]
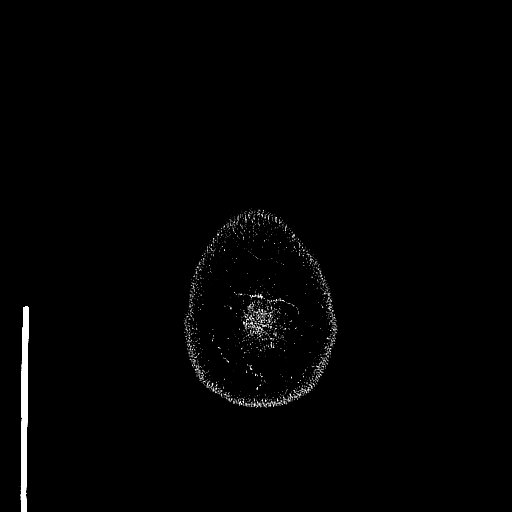

[16 of 30 positions shown; findings below may reference images not displayed]

FINDINGS: There is no evidence of acute cortical infarct, intracranial
hemorrhage, mass, midline shift, or extra-axial fluid collection.
Ventricles and sulci are normal. Minimal periventricular white
matter hypodensity, most conspicuous near the left frontal horn, is
unchanged and nonspecific but may reflect minimal chronic small
vessel ischemic disease.

Orbits are unremarkable. The visualized paranasal sinuses and
mastoid air cells are clear. No skull fracture or destructive
osseous lesion is seen.
IMPRESSION: No evidence of acute intracranial abnormality.

## 2016-08-06 ENCOUNTER — Other Ambulatory Visit: Payer: Self-pay | Admitting: Cardiovascular Disease

## 2016-08-08 ENCOUNTER — Telehealth (HOSPITAL_COMMUNITY): Payer: Self-pay | Admitting: Cardiac Rehabilitation

## 2016-08-08 NOTE — Telephone Encounter (Signed)
pc to pt to discuss enrolling in cardiac rehab.  Pt declined at this time, due to work schedule. Pt is currently exercising on his own.

## 2016-12-06 ENCOUNTER — Other Ambulatory Visit: Payer: Self-pay | Admitting: Cardiovascular Disease

## 2016-12-06 NOTE — Telephone Encounter (Signed)
Pharmacy requesting a refill on Cyclobenzaprine 10 mg tablet. Would you like to refill this medication? Please advise

## 2016-12-13 ENCOUNTER — Telehealth: Payer: Self-pay

## 2016-12-13 NOTE — Telephone Encounter (Signed)
Faxed signed prescription by Dr. Eden Emms to [facility] off of [address] [facility] for Cyclobenzaprine 10 mg by mouth three times daily as needed for muscle spasm.

## 2016-12-15 IMAGING — CR DG CHEST 2V
2 series · 2 of 2 positions shown · non-contrast
Comparison: 09/12/2015

CLINICAL DATA: Weakness and progressive chest pain last 5 days

EXAM:
CHEST  2 VIEW

[chest pa]
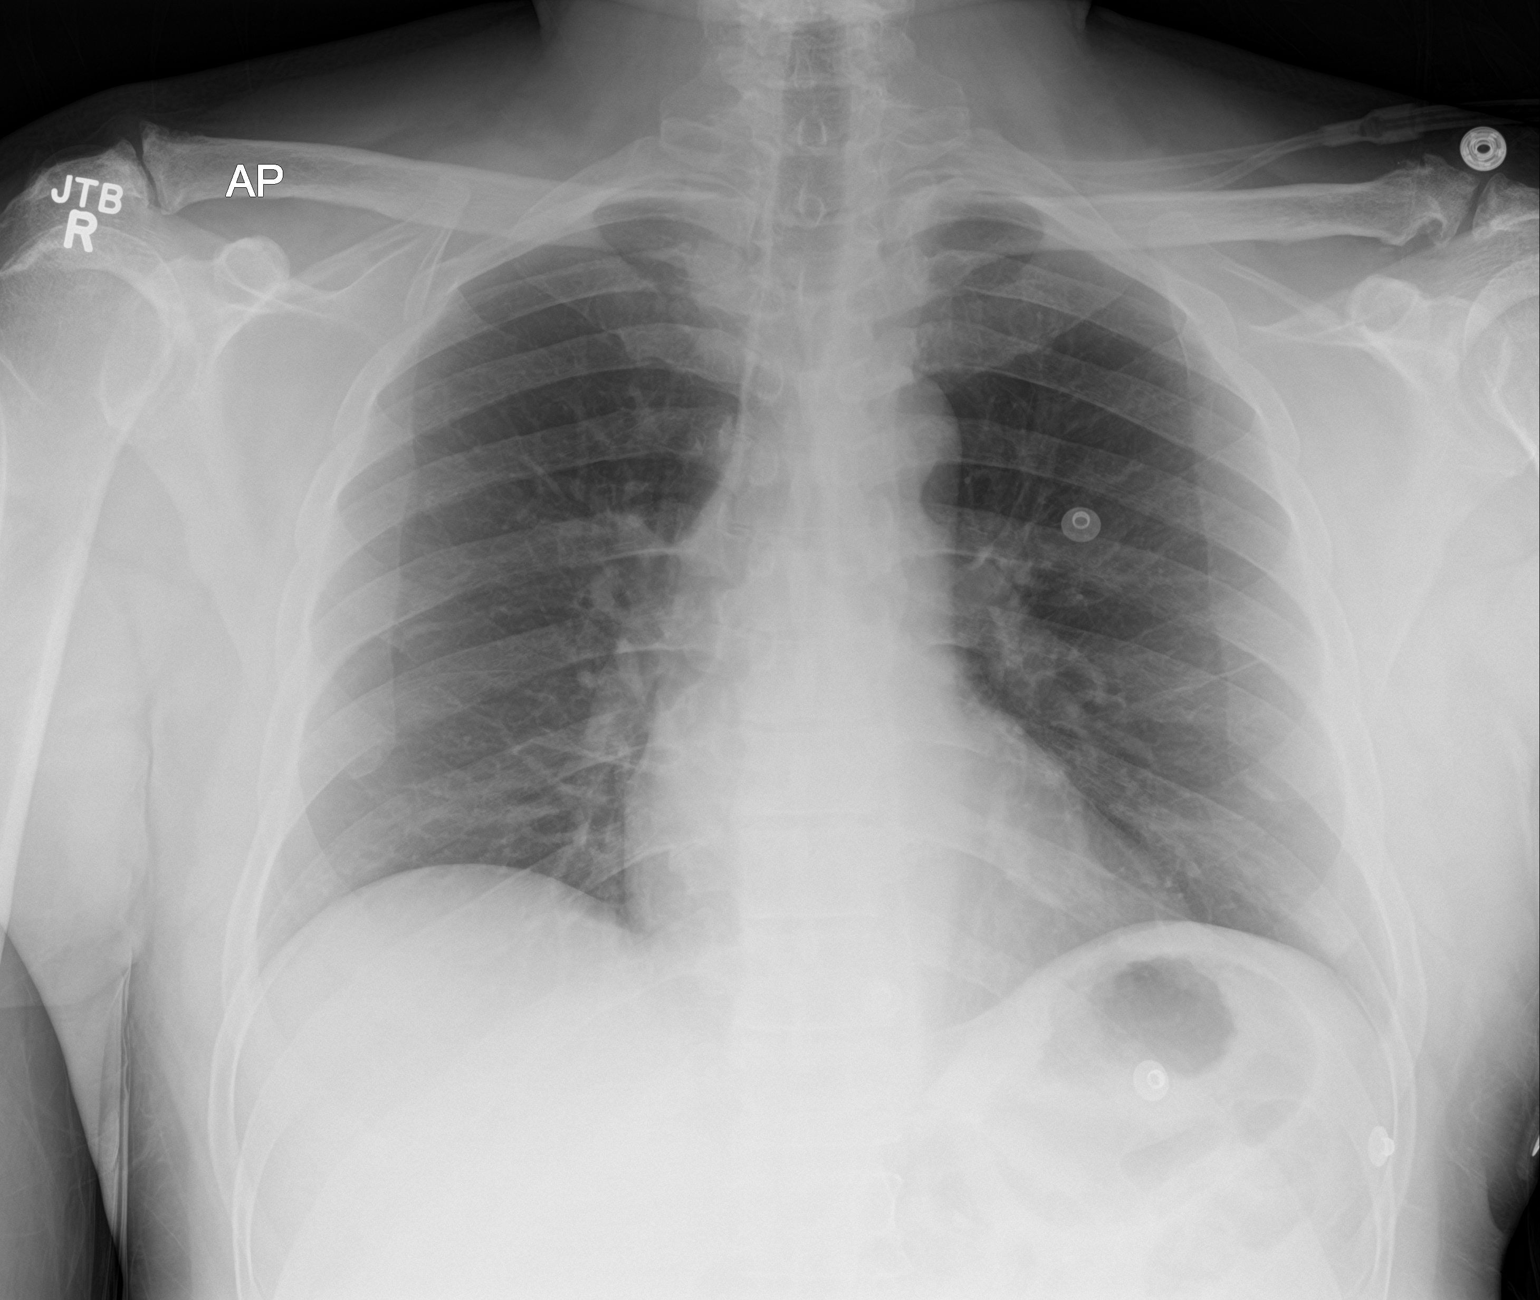

[chest lat]
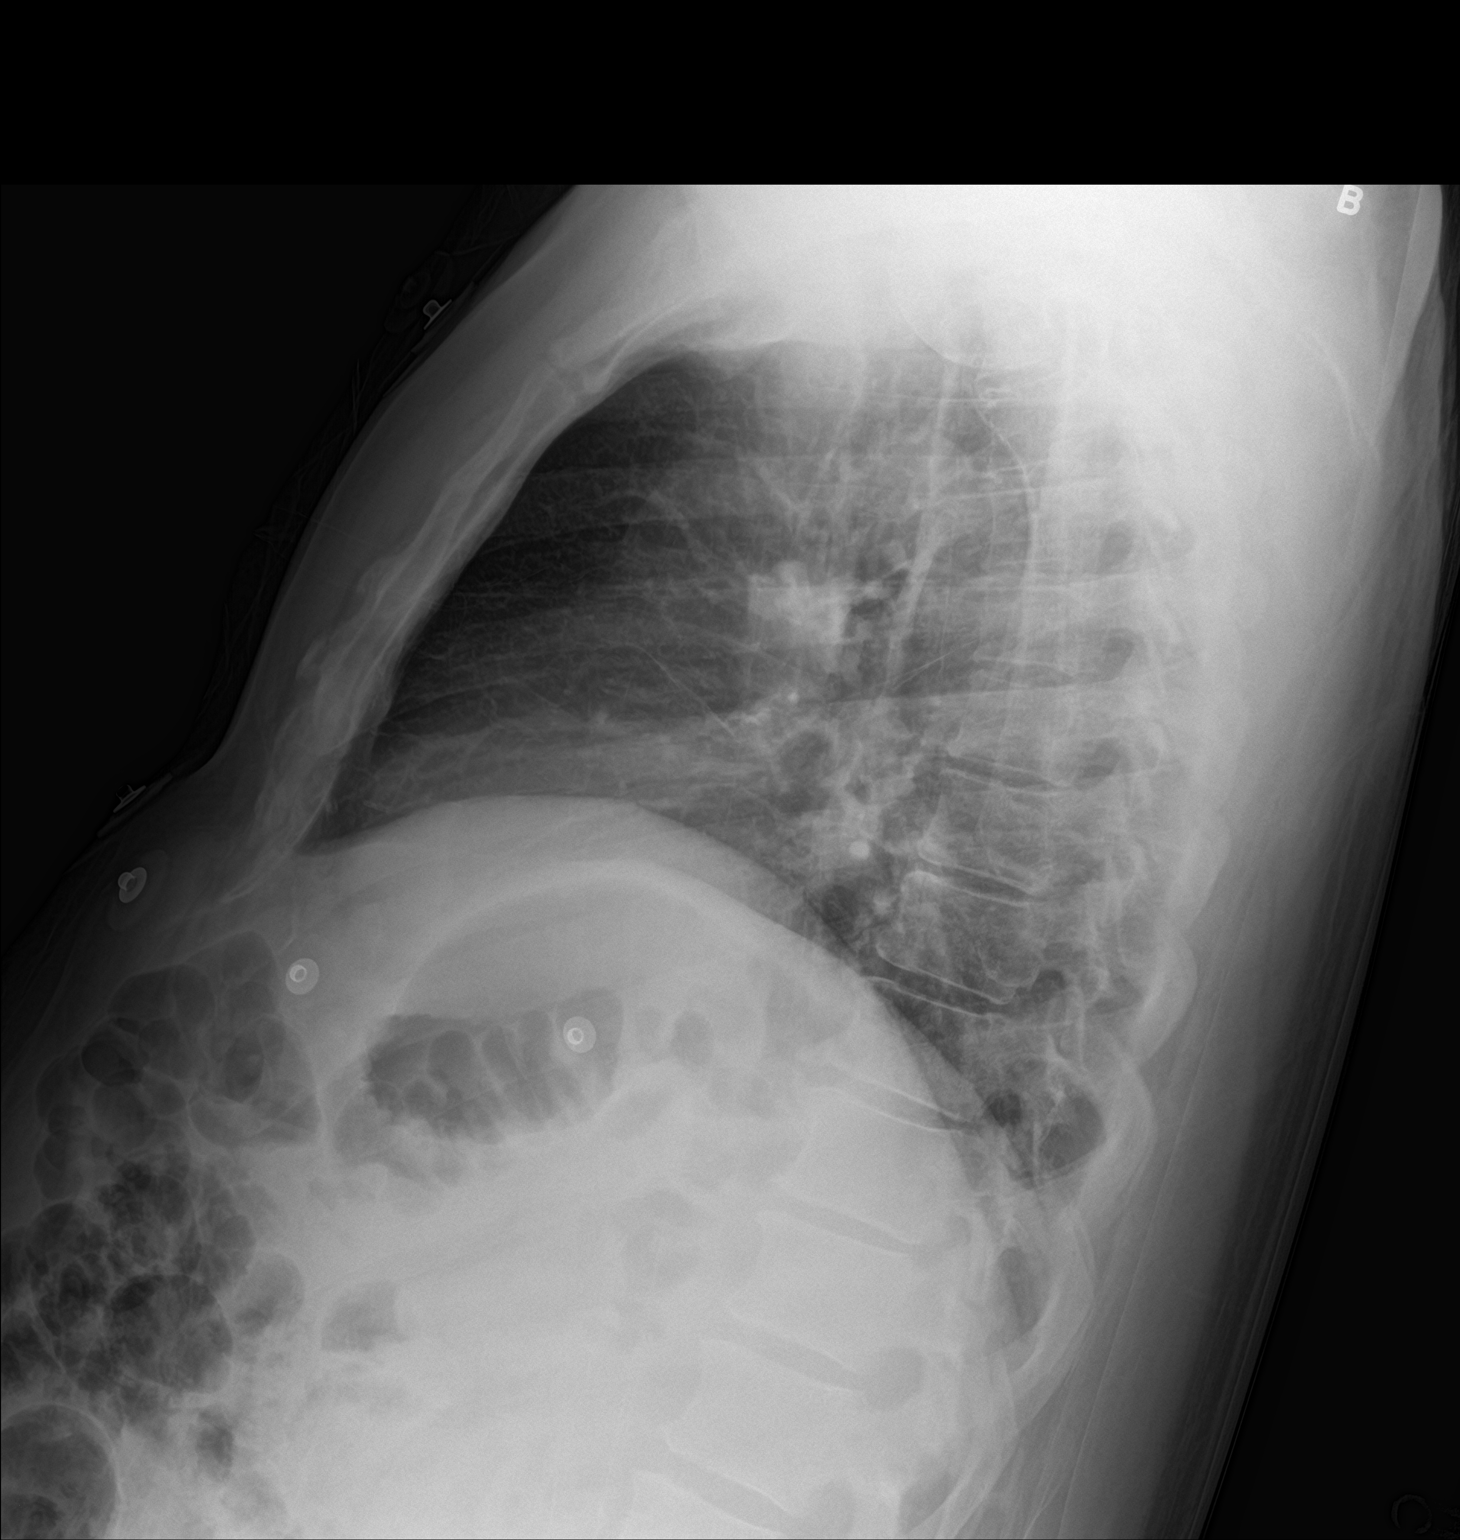

[2 of 2 positions shown; findings below may reference images not displayed]

FINDINGS: Cardiomediastinal silhouette is stable. No acute infiltrate or
pleural effusion. No pulmonary edema. Degenerative changes bilateral
shoulders.
IMPRESSION: No active cardiopulmonary disease.

## 2016-12-15 IMAGING — MR MR CERVICAL SPINE WO/W CM
4 of 8 series · 17 of 48 positions shown · IV contrast (Yes   MULTIHANCE)
Comparison: None.

CLINICAL DATA: Initial evaluation for acute left arm pain.

EXAM:
MRI CERVICAL SPINE WITHOUT AND WITH CONTRAST
TECHNIQUE: Multiplanar and multiecho pulse sequences of the cervical spine, to
include the craniocervical junction and cervicothoracic junction,
were obtained according to standard protocol without and with
intravenous contrast.
CONTRAST:  15mL MULTIHANCE GADOBENATE DIMEGLUMINE 529 MG/ML IV SOLN

[Series 3: T2 · sagittal · 3.0mm · 0.43mm/px · 4 of 14 slices shown (1 of 2)]
[im 1/14]
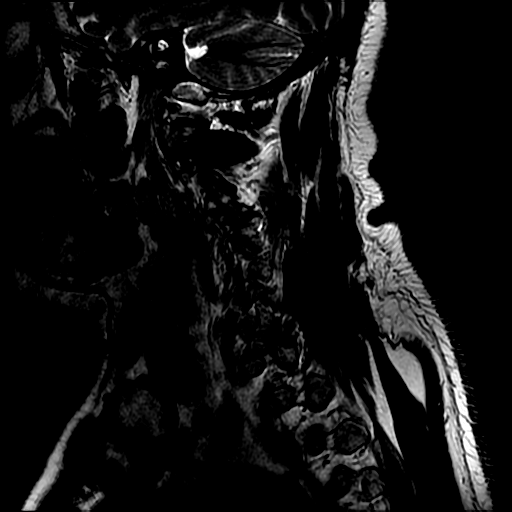
[im 5/14]
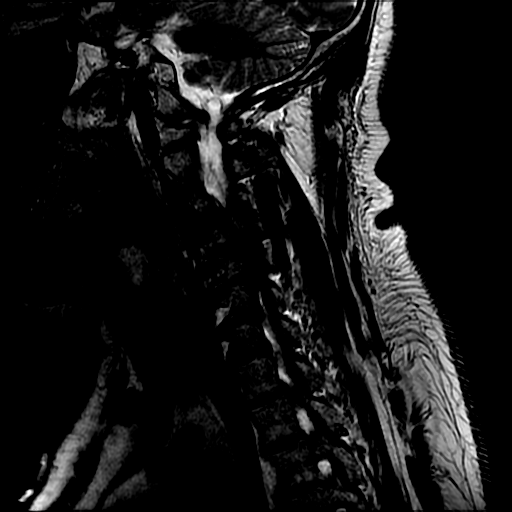
[im 9/14]
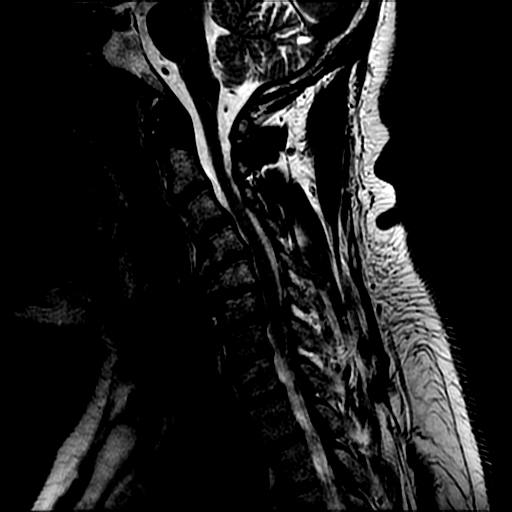
[im 14/14]
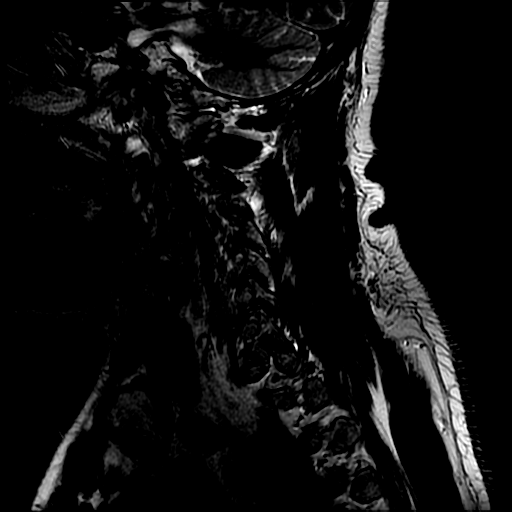

[Series 4: T1 · sagittal · 3.0mm · 0.43mm/px · 3 of 14 slices shown (1 of 2)]
[im 1/14]
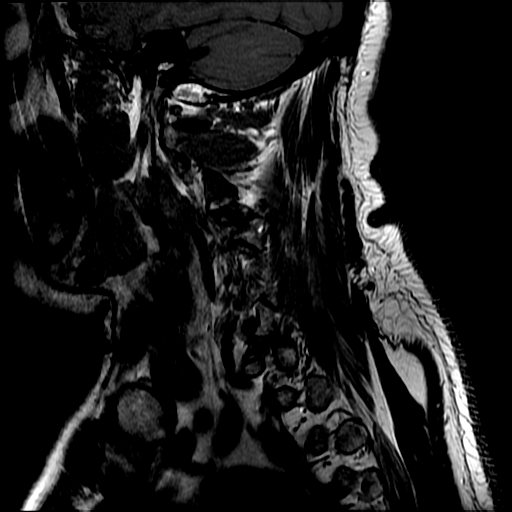
[im 9/14]
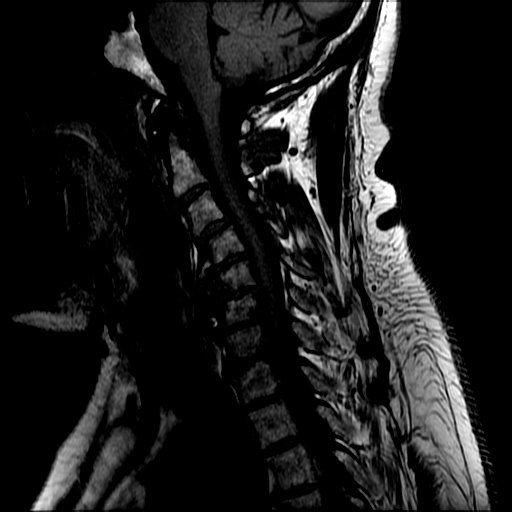
[im 14/14]
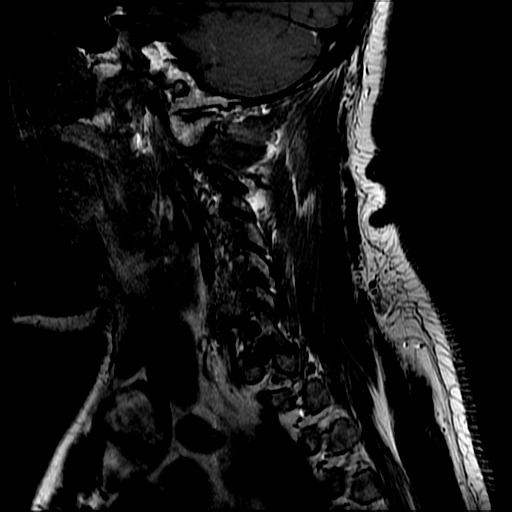

[Series 7: T2 · axial · 3.0mm · 0.39mm/px · z∈[-36,+34]mm · 7 of 26 slices shown (2 of 2)]
[im 1/26]
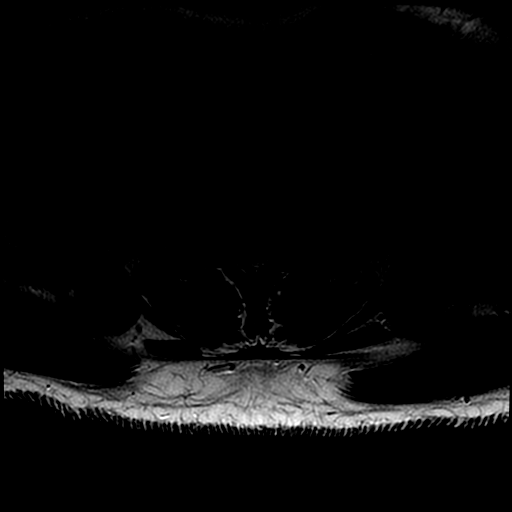
[im 4/26]
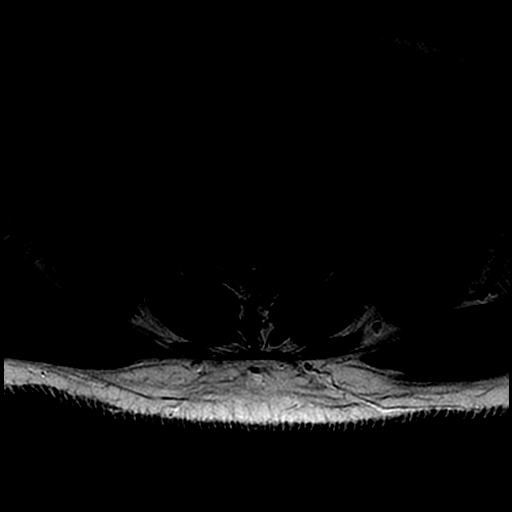
[im 8/26]
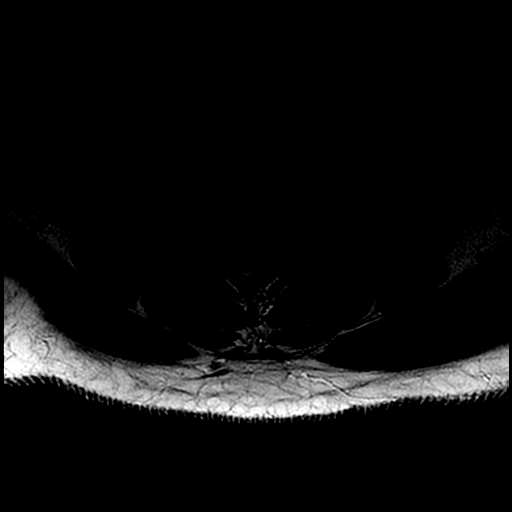
[im 11/26]
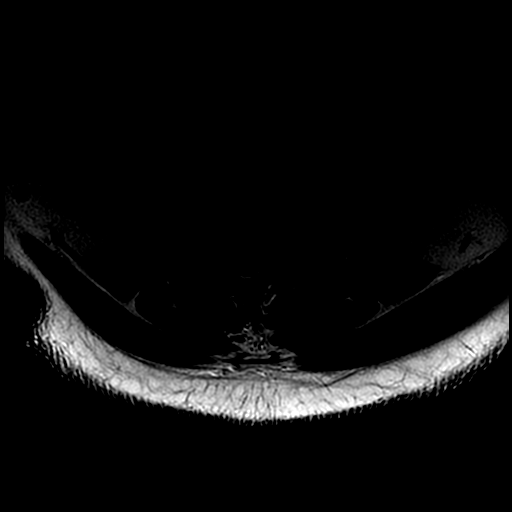
[im 15/26]
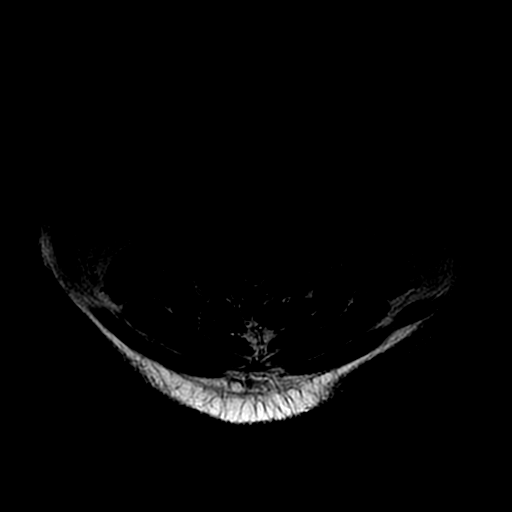
[im 18/26]
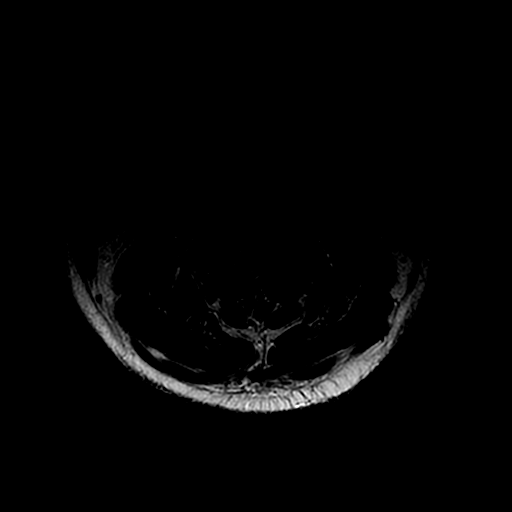
[im 22/26]
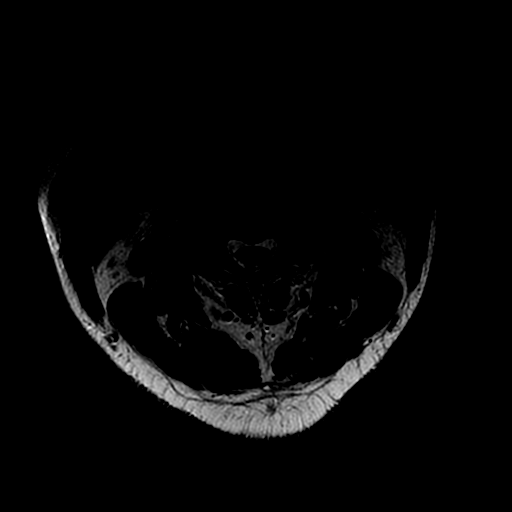

[Series 8: T1 · axial · non-contrast · 3.0mm · 0.39mm/px · z∈[-26,+34]mm · 3 of 26 slices shown (2 of 2)]
[im 4/26]
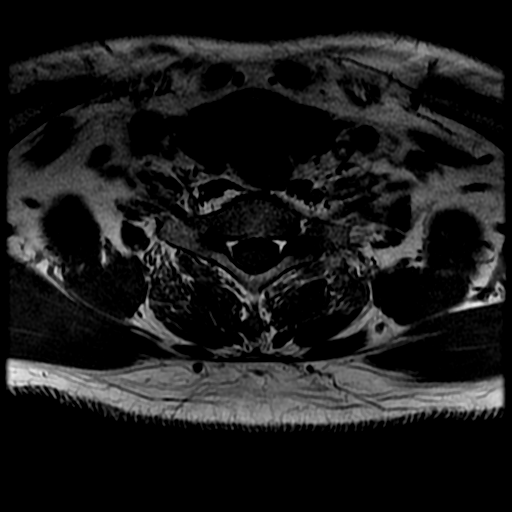
[im 15/26]
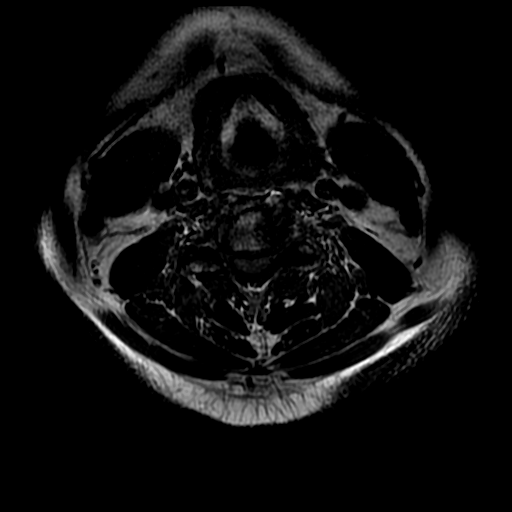
[im 22/26]
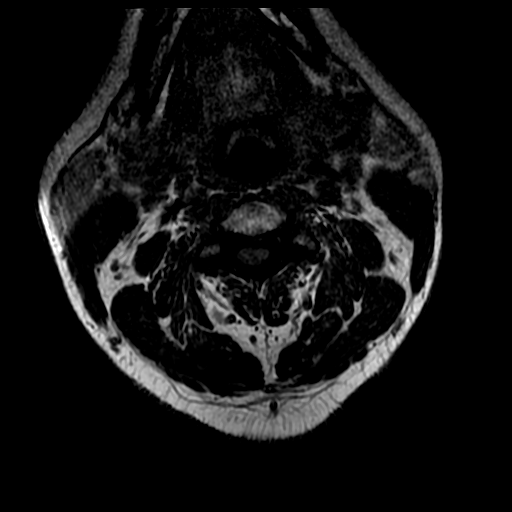

[17 of 48 positions shown; findings below may reference images not displayed]

FINDINGS: Visualized portions of the brain and posterior fossa demonstrate no
acute abnormality. Craniocervical junction widely patent.

Reversal of the normal cervical lordosis with apex at C4-5. Trace
anterolisthesis of C3 on C4. No other listhesis or malalignment.
Mild degenerative vertebral body height loss at C5 and C6. Vertebral
body heights otherwise well maintained. No evidence for acute or
chronic fracture.

Signal intensity within the vertebral body bone marrow is normal. No
focal osseous lesions. No marrow edema. No abnormal enhancement.

Signal intensity within the cervical spinal cord is normal.

Paraspinous soft tissues demonstrate no acute abnormality. No
prevertebral edema. Normal intravascular flow voids present within
the vertebral arteries bilaterally.

C2-C3: Bilateral uncovertebral spurring with facet arthrosis.
Minimal posterior disc bulge. Broad-based disc osteophyte complex
minimally indents the ventral thecal sac without significant canal
stenosis. No significant foraminal narrowing at this level.

C3-C4: Mild diffuse disc bulge with bilateral uncovertebral
spurring. Left greater than right facet arthrosis. Resultant severe
left foraminal narrowing with more moderate right foraminal
stenosis. Broad-based posterior disc osteophyte indents the ventral
thecal sac and abuts the cervical spinal cord with resultant mild to
moderate canal stenosis.

C4-C5: Degenerative intervertebral disc space narrowing. Prominent
anterior endplate osteophytic spurring. Bilateral uncovertebral
hypertrophy with mild facet disease. Broad-based posterior disc
osteophyte complex indents the ventral thecal sac with resultant
mild to moderate canal stenosis. Severe left foraminal narrowing
with more moderate right foraminal stenosis.

C5-C6: Degenerative intervertebral disc space narrowing with
prominent endplate anterior osteophytic spurring. Severe bilateral
uncovertebral hypertrophy. Bilateral facet arthrosis. Broad-based
posterior disc osteophyte complex flattens and partially effaces the
ventral thecal sac with resultant moderate canal stenosis.
Superimposed mild ligamentum flavum thickening. There is severe
bilateral foraminal stenosis.

C6-C7: Bilateral uncovertebral hypertrophy with prominent endplate
osteophytic spurring anteriorly. Degenerative intervertebral disc
space narrowing with diffuse disc bulge. Disc bulging is slightly
eccentric to the right. Broad-based posterior disc osteophyte
flattens the ventral thecal sac and results in moderate canal
stenosis. Thecal sac measures 9 mm in AP diameter. Severe bilateral
foraminal stenosis present.

C7-T1: Degenerative intervertebral disc space narrowing with
endplate osteophytic spurring anteriorly. Mild diffuse disc bulge.
Bilateral uncovertebral spurring. Resultant severe bilateral
foraminal stenosis. Posterior disc bulge flattens the ventral thecal
sac with resultant mild canal stenosis.

Visualized upper thoracic spine demonstrates no acute abnormality.
No significant stenosis identified.
IMPRESSION: 1. Reversal of the normal cervical lordosis with fairly advanced
multilevel degenerative spondylolysis extending from the C3-4
through C7-T1 levels. Resultant mild to moderate diffuse canal
narrowing at C3-4 through C6-7, most severe at C5-6 and C6-7.
2. Multifactorial degenerative changes with resultant severe left
foraminal stenosis at C3-4, C4-5, C5-6, C6-7, and C7-T1. Severe
right foraminal stenosis at C5-6, C6-7, and C7-T1.

## 2016-12-25 NOTE — Progress Notes (Signed)
CARDIOLOGY OFFICE NOTE  Date:  12/26/2016    [name] Date of Birth: [birth year] Medical Record #[id number]  PCP:  [facility]  Cardiologist:  [facility]    Chief Complaint  Patient presents with  . Pre-op Exam    Seen for Dr. Eden Emms    History of Present Illness: Russell Carter is a 61 y.o. male who presents today for a pre op visit. Seen for Dr. Eden Emms.   He has a past medical history of moderate non-obstructive CAD by cath in 2011, HTN, HLD, and prior TIA.   He presented to [facility] ED on 03/13/2016 for evaluation of chest pain.  During hospitalization he had episodes of CHB and hypotension.  Cardiac enzymes were negative.   MRI had also been performed due to his numbness in his arms and legs and pain with movement,  which showed significant cervical stenosis of C11-T1.  Neurosurgery was consulted and recommended follow-up in clinic after cardiac clearance was obtained for likely C3-T1 laminectomy and fusion. With symptomatic CHB he had cardiac cath and was found to have 95% RCA.   Promus DES stent 2.5 mm x 20 mm (2.8 mm) was placed and there was a 0% residual stenosis. LV function was normal. He was started on DAPT with ASA and Brilinta. Cocaine use noted in the past.   Saw Dr. Eden Emms back in August - noted that he felt like stopping Brilinta for 5 to 7 days preoperatively after 6 months would be fine.   Comes in today. Here alone. Has ran out of of his Losartan - off for about 2 weeks - says he can't afford it. Says his Medicaid will not cover this anymore. BP sky high. He is fearful of having a recurrent stroke. Has chronic neck pain and in both arms - runs down the right leg as well. Numbness in the right arm/leg noted. He is seeing Dr. Bevely Palmer next week - no actual planned surgery date but he is hoping to get this scheduled. No actual chest pain. 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   Past Medical  History:  Diagnosis Date  . Anginal pain (HCC)   . Arthritis   . Blood in stool   . CAD (coronary artery disease)    a. 2011: cath showing mod non-obstructive disease b. 02/2016: cath with 95% stenosis in the Mid RCA. DES placed.  . Cervical stenosis of spine   . Cocaine abuse    history  . Depression   . Diabetes mellitus without complication (HCC)   . Gout, unspecified   . Headache   . HTN (hypertension)   . Hypercholesterolemia   . Hyperglycemia   . Migraines   . Myocardial infarction 2008   drug cocaine and marijuana at time of mi none  since    . Pneumonia   . Renal insufficiency   . Stroke [facility])     Past Surgical History:  Procedure Laterality Date  . CARDIAC CATHETERIZATION  2008; ~ 2011   Marvin-40%lad  . CARDIAC CATHETERIZATION N/A 03/16/2016   Procedure: Left Heart Cath and Coronary Angiography;  Surgeon: Marykay Lex, MD;  Location: [facility] INVASIVE CV LAB;  Service: Cardiovascular;  Laterality: N/A;  . EYE SURGERY Bilateral    "for double vision"  . INGUINAL HERNIA REPAIR  05/12/2012   Procedure: HERNIA REPAIR INGUINAL ADULT;  Surgeon: Liz Malady, MD;  Location:   SURGERY CENTER;  Service: General;  Laterality: Right;  . INGUINAL HERNIA REPAIR Left 02/02/2003   Dr. Violeta Gelinas. repair with mesh. Same procedure done again in  05/22/2004   . INGUINAL HERNIA REPAIR Left ?date 2nd OR  . RECONSTRUCT / STABILIZE DISTAL ULNA    . SHOULDER OPEN ROTATOR CUFF REPAIR Left 2010  . SHOULDER SURGERY Left    "injured on the job; had to cut small bone out"  . THUMB FUSION Right      Medications: Current Outpatient Prescriptions  Medication Sig Dispense Refill  . allopurinol (ZYLOPRIM) 300 MG tablet Take 1 tablet (300 mg total) by mouth daily. 30 tablet 6  . amLODipine (NORVASC) 5 MG tablet Take 1 tablet (5 mg total) by mouth daily. 30 tablet 11  . aspirin EC 81 MG EC tablet Take 1 tablet (81 mg total) by mouth daily.    Marland Kitchen CALCIUM-VITAMIN D PO Take 1 tablet  by mouth daily.    . cyclobenzaprine (FLEXERIL) 10 MG tablet TAKE ONE TABLET BY MOUTH THREE TIMES DAILY AS NEEDED FOR MUSCLE SPASMS 30 tablet 0  . diazepam (VALIUM) 5 MG tablet Take 1 tablet (5 mg total) by mouth every 6 (six) hours as needed for muscle spasms. 20 tablet 0  . GABAPENTIN PO Take 3 tablets by mouth daily.    . hydrochlorothiazide (HYDRODIURIL) 25 MG tablet Take 1 tablet (25 mg total) by mouth daily. 30 tablet 11  . HYDROcodone-acetaminophen (NORCO/VICODIN) 5-325 MG tablet Take 1-2 tablets by mouth every 4 (four) hours as needed for moderate pain or severe pain. 30 tablet 0  . Iron-Vitamins (GERITOL PO) Take 2 tablets by mouth daily.    . meclizine (ANTIVERT) 25 MG tablet Take 1 tablet (25 mg total) by mouth 3 (three) times daily as needed. For vertigo 30 tablet 6  . nitroGLYCERIN (NITROSTAT) 0.4 MG SL tablet Place 1 tablet (0.4 mg total) under the tongue every 5 (five) minutes as needed for chest pain. 25 tablet 4  . pravastatin (PRAVACHOL) 40 MG tablet Take 1 tablet (40 mg total) by mouth daily. 30 tablet 11  . ticagrelor (BRILINTA) 90 MG TABS tablet Take 1 tablet (90 mg total) by mouth 2 (two) times daily. 60 tablet 11   No current facility-administered medications for this visit.     Allergies: Allergies  Allergen Reactions  . Shellfish Allergy Anaphylaxis    Social History: The patient  reports that he has never smoked. He has never used smokeless tobacco. He reports that he drinks about 13.8 oz of alcohol per week . He reports that he uses drugs, including "Crack" cocaine, Marijuana, and Cocaine.   Family History: The patient's family history includes Heart attack in his brother, father, and mother.   Review of Systems: Please see the history of present illness.   Otherwise, the review of systems is positive for none.   All other systems are reviewed and negative.   Physical Exam: VS:  BP (!) 180/100   Pulse 75   Ht 5\' 11"  (1.803 m)   Wt 202 lb 1.9 oz (91.7 kg)    BMI 28.19 kg/m  .  BMI Body mass index is 28.19 kg/m.  Wt Readings from Last 3 Encounters:  12/26/16 202 lb 1.9 oz (91.7 kg)  06/05/16 193 lb 6.4 oz (87.7 kg)  03/29/16 195 lb 1.9 oz (88.5 kg)   BP is 170/110 by me in both arms.   General: Pleasant. Well developed, well nourished and in no  acute distress.   HEENT: Normal.  Neck: Supple, no JVD, carotid bruits, or masses noted.  Cardiac: Regular rate and rhythm. No murmurs, rubs, or gallops. No edema.  Respiratory:  Lungs are clear to auscultation bilaterally with normal work of breathing.  GI: Soft and nontender.  MS: No deformity or atrophy. Gait and ROM intact.  Skin: Warm and dry. Color is normal.  Neuro:  Strength and sensation are intact and no gross focal deficits noted.  Psych: Alert, appropriate and with normal affect.   LABORATORY DATA:  EKG:  EKG is ordered today. This demonstrates NSR.  Lab Results  Component Value Date   WBC 4.1 03/17/2016   HGB 12.8 (L) 03/17/2016   HCT 39.5 03/17/2016   PLT 163 03/17/2016   GLUCOSE 125 (H) 03/17/2016   CHOL (H) 01/22/2011    250        ATP III CLASSIFICATION:  <200     mg/dL   Desirable  161-096  mg/dL   Borderline High  >=045    mg/dL   High          TRIG 83 01/22/2011   HDL 76 01/22/2011   LDLCALC (H) 01/22/2011    157        Total Cholesterol/HDL:CHD Risk Coronary Heart Disease Risk Table                     Men   Women  1/2 Average Risk   3.4   3.3  Average Risk       5.0   4.4  2 X Average Risk   9.6   7.1  3 X Average Risk  23.4   11.0        Use the calculated Patient Ratio above and the CHD Risk Table to determine the patient's CHD Risk.        ATP III CLASSIFICATION (LDL):  <100     mg/dL   Optimal  409-811  mg/dL   Near or Above                    Optimal  130-159  mg/dL   Borderline  914-782  mg/dL   High  >956     mg/dL   Very High   ALT 37 21/30/8657   AST 37 03/13/2016   NA 136 03/17/2016   K 3.4 (L) 03/17/2016   CL 105 03/17/2016    CREATININE 1.22 03/17/2016   BUN 10 03/17/2016   CO2 21 (L) 03/17/2016   TSH 2.215 03/13/2016   INR 1.11 03/16/2016   HGBA1C 5.9 (H) 03/13/2016    BNP (last 3 results) No results for input(s): BNP in the last 8760 hours.  ProBNP (last 3 results) No results for input(s): PROBNP in the last 8760 hours.   Other Studies Reviewed Today: Procedures   Left Heart Cath and Coronary Angiography 02/2016  Conclusion   1. Mid RCA lesion, 95% stenosed. Post intervention with a Promus DES stent 2.5 mm x 20 mm (2.8 mm), there is a 0% residual stenosis. 2. The left ventricular systolic function is normal. 3. Otherwise minimal CAD   Severe single vessel disease involving the mid RCA treated with a DES stent. Due to the length of lesion and initial potential size of the vessel I chose to use a DES stent in this patient with diabetes.  He does have severe cervical spine disease that will likely need to be treated. Based on your pain trial data,  we can consider potentially holding Brilinta for possible surgery in 6 months. Best option would be to bridge with Aggrastat, however if bleeding this would be too high, I think it 6 months he would be fine stopping Brilinta for 5-7 days preoperatively.  Plan:  Transfer to 6 central post procedure unit.  TR band removal per protocol.  On aspirin plus Brilinta for a year, but okay to potentially suspend Brilinta for 5-7 days preoperatively after roughly 6 months.  Continue aggressive risk modification.  Counseling reference absence of cocaine use.      Assessment/Plan: 1.  Pre op clearance - no actual surgery date. BP not controlled. Not on all of his medicines. No active cardiac symptoms.   2.  CAD with DES stent to RCA for 95% blockage. Was to hold DAPT until at least November - now hoping to have surgery. Dr. Eden Emms has advised holding for 5 to 7 days pre operatively.   3.  Essential hypertension- out of ARB. Not able to get in with the  clinic listed on his Medicaid card. Switching to Lisinopril 40 mg a day - will only be 4 dollars whether his Medicaid covers or not. See pharmacy in the HTN clinic next week. Needs BP controlled prior to proceeding with surgery.    4.  Complete heart block (HCC) most likely related to his RCA ischemia.  We are holding BB.  5.  Cervical spinal stenosis  MRI 5/23 with multi level foraminal stenosis extending from C3-T1 - seeing Neurosurgery next week for OV. No actual surgery date noted.   6. Dyslipidemia on statin - needs labs today  Current medicines are reviewed with the patient today.  The patient does not have concerns regarding medicines other than what has been noted above.  The following changes have been made:  See above.  Labs/ tests ordered today include:    Orders Placed This Encounter  Procedures  . EKG 12-Lead     Disposition:   FU with the HTN clinic in one week. Further disposition to follow. He was given the names of other clinics to establish with primary care for his Medicaid.   Patient is agreeable to this plan and will call if any problems develop in the interim.   SignedNorma Fredrickson, NP  12/26/2016 12:02 PM  [facility] [address] [place], [place]  [address] Phone: [phone] Fax: [phone]

## 2016-12-26 ENCOUNTER — Ambulatory Visit (INDEPENDENT_AMBULATORY_CARE_PROVIDER_SITE_OTHER): Payer: Medicaid Other | Admitting: Nurse Practitioner

## 2016-12-26 ENCOUNTER — Telehealth: Payer: Self-pay | Admitting: *Deleted

## 2016-12-26 ENCOUNTER — Encounter: Payer: Self-pay | Admitting: Nurse Practitioner

## 2016-12-26 VITALS — BP 180/100 | HR 75 | Ht 71.0 in | Wt 202.1 lb

## 2016-12-26 DIAGNOSIS — I2510 Atherosclerotic heart disease of native coronary artery without angina pectoris: Secondary | ICD-10-CM

## 2016-12-26 DIAGNOSIS — I10 Essential (primary) hypertension: Secondary | ICD-10-CM

## 2016-12-26 DIAGNOSIS — Z01818 Encounter for other preprocedural examination: Secondary | ICD-10-CM

## 2016-12-26 DIAGNOSIS — E7800 Pure hypercholesterolemia, unspecified: Secondary | ICD-10-CM | POA: Diagnosis not present

## 2016-12-26 MED ORDER — LOSARTAN POTASSIUM 100 MG PO TABS: 100.0000 mg | ORAL_TABLET | Freq: Every day | ORAL | 3 refills | Status: DC

## 2016-12-26 MED ORDER — LISINOPRIL 40 MG PO TABS: 40.0000 mg | ORAL_TABLET | Freq: Every day | ORAL | 11 refills | Status: DC

## 2016-12-26 NOTE — Patient Instructions (Addendum)
We will be checking the following labs today - BMET, CBC, HPF and Lipids   Medication Instructions:    Continue with your current medicines.   I am stopping the Losartan  We will place you on Lisinopril 40 mg a day - this is only 4 dollars even without Medicaid    Testing/Procedures To Be Arranged:  N/A  Follow-Up:   See Megan/Kelly in HTN clinic in one week    Other Special Instructions:   N/A    If you need a refill on your cardiac medications before your next appointment, please call your pharmacy.   Call the [facility] office at [phone] if you have any questions, problems or concerns.

## 2016-12-26 NOTE — Telephone Encounter (Signed)
[facility], not excepting new patients. Gave list of practices that are.  Gave to pt.

## 2016-12-27 LAB — LIPID PANEL
Chol/HDL Ratio: 4.5 ratio units (ref 0.0–5.0)
Cholesterol, Total: 191 mg/dL (ref 100–199)
HDL: 42 mg/dL (ref >39)
LDL Calculated: 92 mg/dL (ref 0–99)
Triglycerides: 285 mg/dL — ABNORMAL HIGH (ref 0–149)
VLDL Cholesterol Cal: 57 mg/dL — ABNORMAL HIGH (ref 5–40)

## 2016-12-27 LAB — BASIC METABOLIC PANEL
BUN/Creatinine Ratio: 16 (ref 10–24)
BUN: 20 mg/dL (ref 8–27)
CO2: 23 mmol/L (ref 18–29)
Calcium: 10 mg/dL (ref 8.6–10.2)
Chloride: 102 mmol/L (ref 96–106)
Creatinine, Ser: 1.22 mg/dL (ref 0.76–1.27)
GFR calc Af Amer: 74 mL/min/{1.73_m2} (ref >59)
GFR calc non Af Amer: 64 mL/min/{1.73_m2} (ref >59)
Glucose: 96 mg/dL (ref 65–99)
Potassium: 4.5 mmol/L (ref 3.5–5.2)
Sodium: 140 mmol/L (ref 134–144)

## 2016-12-27 LAB — HEPATIC FUNCTION PANEL
ALT: 32 IU/L (ref 0–44)
AST: 26 IU/L (ref 0–40)
Albumin: 4.7 g/dL (ref 3.6–4.8)
Alkaline Phosphatase: 53 IU/L (ref 39–117)
Bilirubin Total: 0.3 mg/dL (ref 0.0–1.2)
Bilirubin, Direct: 0.1 mg/dL (ref 0.00–0.40)
Total Protein: 7.1 g/dL (ref 6.0–8.5)

## 2016-12-27 LAB — CBC
Hematocrit: 45.6 % (ref 37.5–51.0)
Hemoglobin: 15.5 g/dL (ref 13.0–17.7)
MCH: 30 pg (ref 26.6–33.0)
MCHC: 34 g/dL (ref 31.5–35.7)
MCV: 88 fL (ref 79–97)
Platelets: 195 10*3/uL (ref 150–379)
RBC: 5.16 x10E6/uL (ref 4.14–5.80)
RDW: 15.9 % — ABNORMAL HIGH (ref 12.3–15.4)
WBC: 5.4 10*3/uL (ref 3.4–10.8)

## 2017-01-01 ENCOUNTER — Telehealth: Payer: Self-pay | Admitting: Nurse Practitioner

## 2017-01-01 NOTE — Telephone Encounter (Signed)
°  °   New message    pt verbalized that he is returning call to speak to rn    About lab results

## 2017-01-03 ENCOUNTER — Other Ambulatory Visit: Payer: Self-pay | Admitting: Neurological Surgery

## 2017-01-04 ENCOUNTER — Ambulatory Visit (INDEPENDENT_AMBULATORY_CARE_PROVIDER_SITE_OTHER): Payer: Medicaid Other | Admitting: Pharmacist

## 2017-01-04 VITALS — BP 130/84 | HR 91

## 2017-01-04 DIAGNOSIS — E785 Hyperlipidemia, unspecified: Secondary | ICD-10-CM

## 2017-01-04 DIAGNOSIS — I10 Essential (primary) hypertension: Secondary | ICD-10-CM

## 2017-01-04 MED ORDER — ROSUVASTATIN CALCIUM 40 MG PO TABS: 40.0000 mg | ORAL_TABLET | Freq: Every day | ORAL | 11 refills | Status: DC

## 2017-01-04 MED ORDER — AMLODIPINE BESYLATE 10 MG PO TABS: 10.0000 mg | ORAL_TABLET | Freq: Every day | ORAL | 11 refills | Status: DC

## 2017-01-04 NOTE — Patient Instructions (Addendum)
Blood pressure: Stop taking lisinopril.  Continue taking losartan.  Increase amlodipine to 10mg  daily.  Cholesterol: Stop taking pravastatin.  Start taking rosuvastatin (Crestor) 40mg  once a day.  Follow up in 2 weeks for blood pressure check before your procedure

## 2017-01-04 NOTE — Progress Notes (Signed)
Patient ID: Russell Carter                 DOB: 07-07-1956                      MRN: [id number]     HPI: Russell Carter is a 61 y.o. male patient of Dr Eden Emms referred by Norma Fredrickson, NP to HTN clinic. PMH is significant for non-obstructive CAD by cath in 2011, severe single vessel CAD with 95% RCA in 02/2016 s/p DES, prior TIA, MI in 2008, DM, HTN, and HLD. At last office visit, pt's BP was 180/100. He stated that Medicaid would not cover his losartan and had been out of this for 2 weeks. He was switched to lisinopril 40mg  daily for affordability. Pt needs surgery for cervical stenosis that's causing numbness in his arms and legs, however he cannot proceed with procedure until his BP is under control.   Pt states that the pharmacy somehow got his losartan covered by Medicare for a $3 copay. He has been taking losartan in addition to lisinopril for the past 2 days because the pharmacy did not tell him he should only be taking 1 of these. He does not check his BP at home. Has felt a bit fatigued. Denies dizziness, blurred vision, falls, or headache. Reports his upcoming procedure date is set for April 5th.  He stays active by walking multiple times a day. He does not add salt to his food. Many of his family members are hypertensive as well and do not cook with salt. He does drink caffeine throughout the day - coffee in the morning and soda/tea with lunch and dinner. He recently switched over to caffeine-free soda.  Pt takes pravastatin 40mg  daily for his cholesterol, recent LDL was 92 but TG elevated at 285 so LDL likely higher. Goal <70 given hx of CAD s/p MI and TIA. He does not recall ever trying another cholesterol medicine and does not report side effects with his pravastatin. Double checked and all generic statins are covered on [place] Medicare formulary for 2018.  Current HTN meds: amlodipine 5mg  daily, HCTZ 25mg  daily, lisinopril 40mg  daily  BP goal: <130/20mmHg  Family History: The patient's family  history includes Heart attack in his brother, father, and mother.   Social History: The patient  reports that he has never smoked. He has never used smokeless tobacco. He reports that he drinks about 13.8 oz of alcohol per week . He reports that he uses drugs, including crack cocaine, Marijuana, and Cocaine.  Diet: Grits, eggs and coffee for breakfast. Tea or soda with sandwich for lunch. Has been cutting back on carbs since his TG were high. Meat and salad for dinner - usually has grilled salad. Soda with dinner. Is trying to drink more water. Does not cook with salt, his relatives have high BP so none of them cook with salt either.  Exercise: Walks 3x per day 30-60 minutes.   Home BP readings: Does not have a cuff  Wt Readings from Last 3 Encounters:  12/26/16 202 lb 1.9 oz (91.7 kg)  06/05/16 193 lb 6.4 oz (87.7 kg)  03/29/16 195 lb 1.9 oz (88.5 kg)   BP Readings from Last 3 Encounters:  12/26/16 (!) 180/100  06/05/16 (!) 144/86  03/29/16 (!) 156/104   Pulse Readings from Last 3 Encounters:  12/26/16 75  06/05/16 61  03/29/16 88    Renal function: Estimated Creatinine Clearance: 73.7 mL/min (by C-G formula based  on SCr of 1.22 mg/dL).  Past Medical History:  Diagnosis Date  . Anginal pain (HCC)   . Arthritis   . Blood in stool   . CAD (coronary artery disease)    a. 2011: cath showing mod non-obstructive disease b. 02/2016: cath with 95% stenosis in the Mid RCA. DES placed.  . Cervical stenosis of spine   . Cocaine abuse    history  . Depression   . Diabetes mellitus without complication (HCC)   . Gout, unspecified   . Headache   . HTN (hypertension)   . Hypercholesterolemia   . Hyperglycemia   . Migraines   . Myocardial infarction 2008   drug cocaine and marijuana at time of mi none  since    . Pneumonia   . Renal insufficiency   . Stroke [facility])     Current Outpatient Prescriptions on File Prior to Visit  Medication Sig Dispense Refill  . allopurinol  (ZYLOPRIM) 300 MG tablet Take 1 tablet (300 mg total) by mouth daily. 30 tablet 6  . amLODipine (NORVASC) 5 MG tablet Take 1 tablet (5 mg total) by mouth daily. 30 tablet 11  . aspirin EC 81 MG EC tablet Take 1 tablet (81 mg total) by mouth daily.    Marland Kitchen CALCIUM-VITAMIN D PO Take 1 tablet by mouth daily.    . cyclobenzaprine (FLEXERIL) 10 MG tablet TAKE ONE TABLET BY MOUTH THREE TIMES DAILY AS NEEDED FOR MUSCLE SPASMS 30 tablet 0  . diazepam (VALIUM) 5 MG tablet Take 1 tablet (5 mg total) by mouth every 6 (six) hours as needed for muscle spasms. 20 tablet 0  . GABAPENTIN PO Take 3 tablets by mouth daily.    . hydrochlorothiazide (HYDRODIURIL) 25 MG tablet Take 1 tablet (25 mg total) by mouth daily. 30 tablet 11  . HYDROcodone-acetaminophen (NORCO/VICODIN) 5-325 MG tablet Take 1-2 tablets by mouth every 4 (four) hours as needed for moderate pain or severe pain. 30 tablet 0  . Iron-Vitamins (GERITOL PO) Take 2 tablets by mouth daily.    Marland Kitchen lisinopril (PRINIVIL,ZESTRIL) 40 MG tablet Take 1 tablet (40 mg total) by mouth daily. 30 tablet 11  . meclizine (ANTIVERT) 25 MG tablet Take 1 tablet (25 mg total) by mouth 3 (three) times daily as needed. For vertigo 30 tablet 6  . nitroGLYCERIN (NITROSTAT) 0.4 MG SL tablet Place 1 tablet (0.4 mg total) under the tongue every 5 (five) minutes as needed for chest pain. 25 tablet 4  . pravastatin (PRAVACHOL) 40 MG tablet Take 1 tablet (40 mg total) by mouth daily. 30 tablet 11  . ticagrelor (BRILINTA) 90 MG TABS tablet Take 1 tablet (90 mg total) by mouth 2 (two) times daily. 60 tablet 11  . [DISCONTINUED] famotidine (PEPCID) 20 MG tablet Take 1 tablet (20 mg total) by mouth 2 (two) times daily. 30 tablet 0  . [DISCONTINUED] TACRINE HYDROCHLORIDE PO Take 25 mg by mouth daily.       No current facility-administered medications on file prior to visit.     Allergies  Allergen Reactions  . Shellfish Allergy Anaphylaxis     Assessment/Plan:  1. Hypertension - BP  improved to 130/50mmHg however pt has been taking max dose losartan and lisinopril together. Will stop lisinopril and increase amlodipine to 10mg  daily. Will f/u in 2 weeks for BP check the week prior to his procedure to make sure HTN is still controlled.  2. Hyperlipidemia - LDL 92 above goal 70mg /dL given hx of CAD s/p MI  and TIA. Will stop pravastatin 40mg  and start rosuvastatin 40mg  daily. All generic statins covered on [place] Medicaid formulary.   Megan E. Supple, PharmD, CPP, BCACP  [facility] [address] [place], [place] [address] Phone: [phone]; Fax: [phone] 01/04/2017 11:25 AM

## 2017-01-16 ENCOUNTER — Inpatient Hospital Stay (HOSPITAL_COMMUNITY)
Admission: RE | Admit: 2017-01-16 | Discharge: 2017-01-16 | Disposition: A | Discharge status: Home / Self Care | Payer: Medicaid Other | Source: Ambulatory Visit

## 2017-01-16 NOTE — Pre-Procedure Instructions (Signed)
Kymarion Mulhearn  01/16/2017      [facility] - [place], [place] - [address] [place] [address] Phone: [phone] Fax: [phone]    Your procedure is scheduled on 01/24/17.  Report to [facility] Admitting at 630 A.M.  Call this number if you have problems the morning of surgery:  [phone]   Remember:  Do not eat food or drink liquids after midnight.  Take these medicines the morning of surgery with A SIP OF WATER    Allopurinol,amiodarone(norvasc),gabapentin,pain med and nitro if needed  STOP all herbel meds, nsaids (aleve,naproxen,advil,ibuprofen) 7 days prior to surgery starting tomorrow including all vitamins/supplements,  STOP Aspirin, Brilinta per dr instructions   Do not wear jewelry, make-up or nail polish.  Do not wear lotions, powders, or perfumes, or deoderant.  Do not shave 48 hours prior to surgery.  Men may shave face and neck.  Do not bring valuables to the hospital.  [facility] is not responsible for any belongings or valuables.  Contacts, dentures or bridgework may not be worn into surgery.  Leave your suitcase in the car.  After surgery it may be brought to your room.  For patients admitted to the hospital, discharge time will be determined by your treatment team.  Patients discharged the day of surgery will not be allowed to drive home.   Special instructions:   Special Instructions:  - Preparing for Surgery  Before surgery, you can play an important role.  Because skin is not sterile, your skin needs to be as free of germs as possible.  You can reduce the number of germs on you skin by washing with CHG (chlorahexidine gluconate) soap before surgery.  CHG is an antiseptic cleaner which kills germs and bonds with the skin to continue killing germs even after washing.  Please DO NOT use if you have an allergy to CHG or antibacterial soaps.  If your skin becomes reddened/irritated stop  using the CHG and inform your nurse when you arrive at Short Stay.  Do not shave (including legs and underarms) for at least 48 hours prior to the first CHG shower.  You may shave your face.  Please follow these instructions carefully:   1.  Shower with CHG Soap the night before surgery and the morning of Surgery.  2.  If you choose to wash your hair, wash your hair first as usual with your normal shampoo.  3.  After you shampoo, rinse your hair and body thoroughly to remove the Shampoo.  4.  Use CHG as you would any other liquid soap.  You can apply chg directly  to the skin and wash gently with scrungie or a clean washcloth.  5.  Apply the CHG Soap to your body ONLY FROM THE NECK DOWN.  Do not use on open wounds or open sores.  Avoid contact with your eyes ears, mouth and genitals (private parts).  Wash genitals (private parts)       with your normal soap.  6.  Wash thoroughly, paying special attention to the area where your surgery will be performed.  7.  Thoroughly rinse your body with warm water from the neck down.  8.  DO NOT shower/wash with your normal soap after using and rinsing off the CHG Soap.  9.  Pat yourself dry with a clean towel.            10.  Wear clean pajamas.  11.  Place clean sheets on your bed the night of your first shower and do not sleep with pets.  Day of Surgery  Do not apply any lotions/deodorants the morning of surgery.  Please wear clean clothes to the hospital/surgery center.  Please read over the  fact sheets that you were given.

## 2017-01-18 ENCOUNTER — Ambulatory Visit (INDEPENDENT_AMBULATORY_CARE_PROVIDER_SITE_OTHER): Payer: Medicaid Other | Admitting: Pharmacist

## 2017-01-18 ENCOUNTER — Encounter: Payer: Self-pay | Admitting: Pharmacist

## 2017-01-18 VITALS — BP 105/55 | HR 81

## 2017-01-18 DIAGNOSIS — I10 Essential (primary) hypertension: Secondary | ICD-10-CM

## 2017-01-18 MED ORDER — AMLODIPINE BESYLATE 5 MG PO TABS: 5.0000 mg | ORAL_TABLET | Freq: Every day | ORAL | 11 refills | Status: DC

## 2017-01-18 NOTE — Progress Notes (Signed)
Patient ID: Russell Carter                 DOB: 24-May-1956                      MRN: [id number]     HPI: Russell Carter is a 61 y.o. male patient of Dr Eden Emms referred by Norma Fredrickson, NP to HTN clinic. PMH is significant for non-obstructive CAD by cath in 2011, severe single vessel CAD with 95% RCA in 02/2016 s/p DES, prior TIA, MI in 2008, DM, HTN, and HLD. Patient is on Medicaid and has had issues affording his medications in the past. At last HTN clinic visit 2 weeks ago, BP was improved to 130/84 but unfortunately he had been taking both losartan and lisinopril together for the previous 2 days. He reported fatigue, but denied dizziness, blurred vision, falls, or headache. At that time, lisinopril was discontinued and amlodipine was increased to 10 mg. Additionally, it was noted that patient's LDL was elevated above goal <70, so pravastatin 40 mg was discontinued and rosuvastatin 40mg  was started given hx of CAD, MI, and TIA.    Pt has surgery for cervical stenosis that's causing numbness in his arms and legs scheduled for 01/24/2017. He cannot proceed with this surgery if his BP is uncontrolled. 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 He says he drinks water throughout the day and stays hydrated. He believes he has been more tired in the past 1-2 weeks. He has been watching his salt intake as well. He does take all 3 of his BP medications at the same time in the morning.   Current HTN meds:  Amlodipine 10mg  daily - ,orning HCTZ 25mg  daily- morning Losartan 100 mg daily - morning  BP goal: <130/66mmHg  Family History: The patient's family history includes Heart attack in his brother, father, and mother.   Social History: The patient  reports that he has never smoked. He has never used smokeless tobacco. He reports that he drinks about 13.8 oz of alcohol per week . He reports that he uses drugs, including crack cocaine, Marijuana,  and Cocaine.  Diet: Grits, eggs and coffee for breakfast. Tea or soda with sandwich for lunch. Has been cutting back on carbs since his TG were high. Meat and salad for dinner - usually has grilled salad. Soda with dinner. Is trying to drink more water. Does not cook with salt, his relatives have high BP so none of them cook with salt either.  Exercise: Walks 3x per day 30-60 minutes.   Home BP readings: Does not have a cuff at home  Labs:  12/27/2016: TC 191, TG 285, HDL 42, LDL 92 (on pravastatin 40 mg daily)  Wt Readings from Last 3 Encounters:  12/26/16 202 lb 1.9 oz (91.7 kg)  06/05/16 193 lb 6.4 oz (87.7 kg)  03/29/16 195 lb 1.9 oz (88.5 kg)   BP Readings from Last 3 Encounters:  01/04/17 130/84  12/26/16 (!) 180/100  06/05/16 (!) 144/86   Pulse Readings from Last 3 Encounters:  01/04/17 91  12/26/16 75  06/05/16 61    Renal function: CrCl cannot be calculated (Patient's most recent lab result is older than the maximum 21 days allowed.).  Past Medical History:  Diagnosis Date  . Anginal pain (HCC)   . Arthritis   . Blood in stool   . CAD (coronary artery disease)  a. 2011: cath showing mod non-obstructive disease b. 02/2016: cath with 95% stenosis in the Mid RCA. DES placed.  . Cervical stenosis of spine   . Cocaine abuse    history  . Depression   . Diabetes mellitus without complication (HCC)   . Gout, unspecified   . Headache   . HTN (hypertension)   . Hypercholesterolemia   . Hyperglycemia   . Migraines   . Myocardial infarction 2008   drug cocaine and marijuana at time of mi none  since    . Pneumonia   . Renal insufficiency   . Stroke [facility])     Current Outpatient Prescriptions on File Prior to Visit  Medication Sig Dispense Refill  . allopurinol (ZYLOPRIM) 300 MG tablet Take 1 tablet (300 mg total) by mouth daily. 30 tablet 6  . amLODipine (NORVASC) 10 MG tablet Take 1 tablet (10 mg total) by mouth daily. 30 tablet 11  . aspirin EC 81 MG EC  tablet Take 1 tablet (81 mg total) by mouth daily.    Marland Kitchen CALCIUM-VITAMIN D PO Take 1 tablet by mouth daily.    . clotrimazole (LOTRIMIN) 1 % cream Apply 1 application topically daily as needed (athlete's foot).    . cyclobenzaprine (FLEXERIL) 10 MG tablet TAKE ONE TABLET BY MOUTH THREE TIMES DAILY AS NEEDED FOR MUSCLE SPASMS 30 tablet 0  . diphenhydrAMINE (BENADRYL) 25 MG tablet Take 25 mg by mouth every 6 (six) hours as needed for itching.    . gabapentin (NEURONTIN) 300 MG capsule Take 300 mg by mouth 3 (three) times daily.    . hydrochlorothiazide (HYDRODIURIL) 25 MG tablet Take 1 tablet (25 mg total) by mouth daily. 30 tablet 11  . HYDROcodone-acetaminophen (NORCO/VICODIN) 5-325 MG tablet Take 1-2 tablets by mouth every 4 (four) hours as needed for moderate pain or severe pain. 30 tablet 0  . losartan (COZAAR) 100 MG tablet Take 1 tablet (100 mg total) by mouth daily. 90 tablet 3  . meclizine (ANTIVERT) 25 MG tablet Take 1 tablet (25 mg total) by mouth 3 (three) times daily as needed. For vertigo 30 tablet 6  . Multiple Vitamin (MULTIVITAMIN WITH MINERALS) TABS tablet Take 1 tablet by mouth daily.    . nitroGLYCERIN (NITROSTAT) 0.4 MG SL tablet Place 1 tablet (0.4 mg total) under the tongue every 5 (five) minutes as needed for chest pain. 25 tablet 4  . Polyethyl Glycol-Propyl Glycol (SYSTANE OP) Apply 1 drop to eye 3 (three) times daily.    . rosuvastatin (CRESTOR) 40 MG tablet Take 1 tablet (40 mg total) by mouth daily. 30 tablet 11  . ticagrelor (BRILINTA) 90 MG TABS tablet Take 1 tablet (90 mg total) by mouth 2 (two) times daily. 60 tablet 11  . [DISCONTINUED] famotidine (PEPCID) 20 MG tablet Take 1 tablet (20 mg total) by mouth 2 (two) times daily. 30 tablet 0  . [DISCONTINUED] TACRINE HYDROCHLORIDE PO Take 25 mg by mouth daily.       No current facility-administered medications on file prior to visit.     Allergies  Allergen Reactions  . Shellfish Allergy Anaphylaxis      Assessment/Plan:  1. Hypertension: Patient's blood pressure is controlled but low today at 105/55, and he is having symptomatic fatigue. Will decrease amlodipine to 5 mg and change to taking in the evening to space out administration of BP medications throughout the day. Continue HCTZ 25 mg and losartan 100 mg in the morning.   Follow up in 4 weeks  for BP check.   Patient was seen with Catie Feliz Beam, PharmD Candidate 2018   Janean Eischen E. Lavoris Canizales, PharmD, CPP, BCACP [facility] [address], [place], [place] [address] Phone: [phone]; Fax: [phone] 01/18/2017 8:13 AM

## 2017-01-18 NOTE — Patient Instructions (Addendum)
Return for a a follow up appointment in 1 month.  Your blood pressure today is 105/55.  Check your blood pressure at home daily (if able) and keep record of the readings.  Take your BP meds as follows: Decrease amlodipine to 5 mg (1/2 of a 10 mg tablet) and change to taking in the evening.  Continue losartan 100 mg and HCTZ 25 in the morning.   Bring all of your meds, your BP cuff and your record of home blood pressures to your next appointment.  Exercise as you're able, try to walk approximately 30 minutes per day.  Keep salt intake to a minimum, especially watch canned and prepared boxed foods.  Eat more fresh fruits and vegetables and fewer canned items.  Avoid eating in fast food restaurants.    HOW TO TAKE YOUR BLOOD PRESSURE: . Rest 5 minutes before taking your blood pressure. .  Don't smoke or drink caffeinated beverages for at least 30 minutes before. . Take your blood pressure before (not after) you eat. . Sit comfortably with your back supported and both feet on the floor (don't cross your legs). . Elevate your arm to heart level on a table or a desk. . Use the proper sized cuff. It should fit smoothly and snugly around your bare upper arm. There should be enough room to slip a fingertip under the cuff. The bottom edge of the cuff should be 1 inch above the crease of the elbow. . Ideally, take 3 measurements at one sitting and record the average.

## 2017-01-21 ENCOUNTER — Encounter (HOSPITAL_COMMUNITY): Payer: Self-pay

## 2017-01-21 ENCOUNTER — Encounter (HOSPITAL_COMMUNITY)
Admission: RE | Admit: 2017-01-21 | Discharge: 2017-01-21 | Disposition: A | Discharge status: Home / Self Care | Payer: Medicaid Other | Source: Ambulatory Visit | Attending: Neurological Surgery | Admitting: Neurological Surgery

## 2017-01-21 DIAGNOSIS — M4722 Other spondylosis with radiculopathy, cervical region: Secondary | ICD-10-CM | POA: Diagnosis present

## 2017-01-21 LAB — CBC
HCT: 43.3 % (ref 39.0–52.0)
Hemoglobin: 14.8 g/dL (ref 13.0–17.0)
MCH: 29.7 pg (ref 26.0–34.0)
MCHC: 34.2 g/dL (ref 30.0–36.0)
MCV: 86.8 fL (ref 78.0–100.0)
PLATELETS: 204 10*3/uL (ref 150–400)
RBC: 4.99 MIL/uL (ref 4.22–5.81)
RDW: 14.4 % (ref 11.5–15.5)
WBC: 4.9 10*3/uL (ref 4.0–10.5)

## 2017-01-21 LAB — ABO/RH: ABO/RH(D): B POS

## 2017-01-21 LAB — COMPREHENSIVE METABOLIC PANEL
ALBUMIN: 3.8 g/dL (ref 3.5–5.0)
ALT: 26 U/L (ref 17–63)
AST: 23 U/L (ref 15–41)
Alkaline Phosphatase: 50 U/L (ref 38–126)
Anion gap: 11 (ref 5–15)
BUN: 21 mg/dL — AB (ref 6–20)
CHLORIDE: 105 mmol/L (ref 101–111)
CO2: 23 mmol/L (ref 22–32)
Calcium: 9.8 mg/dL (ref 8.9–10.3)
Creatinine, Ser: 1.54 mg/dL — ABNORMAL HIGH (ref 0.61–1.24)
GFR calc Af Amer: 54 mL/min — ABNORMAL LOW (ref >60)
GFR calc non Af Amer: 47 mL/min — ABNORMAL LOW (ref >60)
GLUCOSE: 102 mg/dL — AB (ref 65–99)
POTASSIUM: 3.8 mmol/L (ref 3.5–5.1)
SODIUM: 139 mmol/L (ref 135–145)
Total Bilirubin: 0.5 mg/dL (ref 0.3–1.2)
Total Protein: 7.5 g/dL (ref 6.5–8.1)

## 2017-01-21 LAB — SURGICAL PCR SCREEN
MRSA, PCR: NEGATIVE
Staphylococcus aureus: POSITIVE — AB

## 2017-01-21 LAB — GLUCOSE, CAPILLARY: Glucose-Capillary: 110 mg/dL — ABNORMAL HIGH (ref 65–99)

## 2017-01-21 LAB — PROTIME-INR
INR: 1.12
Prothrombin Time: 14.5 seconds (ref 11.4–15.2)

## 2017-01-21 LAB — APTT: APTT: 32 s (ref 24–36)

## 2017-01-21 NOTE — Pre-Procedure Instructions (Signed)
Mizell Mohamed  01/21/2017      [facility] - [place], [place] - [address] [place] [address] Phone: [phone] Fax: [phone]    Your procedure is scheduled on  Thursday  01/24/17  Report to [facility] Admitting at 630 A.M.  Call this number if you have problems the morning of surgery:  [phone]   Remember:  Do not eat food or drink liquids after midnight.  Take these medicines the morning of surgery with A SIP OF WATER   ALLOPURINOL, AMLODIPINE (NORVASC), GABAPENTIN, HYDROCODONE IF NEEDED, EYE DROPS   (STOP- BRILINTA AS INSTRUCTED)   Do not wear jewelry, make-up or nail polish.  Do not wear lotions, powders, or perfumes, or deoderant.  Do not shave 48 hours prior to surgery.  Men may shave face and neck.  Do not bring valuables to the hospital.  [facility] is not responsible for any belongings or valuables.  Contacts, dentures or bridgework may not be worn into surgery.  Leave your suitcase in the car.  After surgery it may be brought to your room.  For patients admitted to the hospital, discharge time will be determined by your treatment team.  Patients discharged the day of surgery will not be allowed to drive home.   Name and phone number of your driver:    Special instructions:  [facility] - Preparing for Surgery  Before surgery, you can play an important role.  Because skin is not sterile, your skin needs to be as free of germs as possible.  You can reduce the number of germs on you skin by washing with CHG (chlorahexidine gluconate) soap before surgery.  CHG is an antiseptic cleaner which kills germs and bonds with the skin to continue killing germs even after washing.  Please DO NOT use if you have an allergy to CHG or antibacterial soaps.  If your skin becomes reddened/irritated stop using the CHG and inform your nurse when you arrive at Short Stay.  Do not shave (including legs and underarms) for at  least 48 hours prior to the first CHG shower.  You may shave your face.  Please follow these instructions carefully:   1.  Shower with CHG Soap the night before surgery and the                                morning of Surgery.  2.  If you choose to wash your hair, wash your hair first as usual with your       normal shampoo.  3.  After you shampoo, rinse your hair and body thoroughly to remove the                      Shampoo.  4.  Use CHG as you would any other liquid soap.  You can apply chg directly       to the skin and wash gently with scrungie or a clean washcloth.  5.  Apply the CHG Soap to your body ONLY FROM THE NECK DOWN.        Do not use on open wounds or open sores.  Avoid contact with your eyes,       ears, mouth and genitals (private parts).  Wash genitals (private parts)       with your normal soap.  6.  Wash thoroughly, paying special attention  to the area where your surgery        will be performed.  7.  Thoroughly rinse your body with warm water from the neck down.  8.  DO NOT shower/wash with your normal soap after using and rinsing off       the CHG Soap.  9.  Pat yourself dry with a clean towel.            10.  Wear clean pajamas.            11.  Place clean sheets on your bed the night of your first shower and do not        sleep with pets.  Day of Surgery  Do not apply any lotions/deoderants the morning of surgery.  Please wear clean clothes to the hospital/surgery center.    Please read over the following fact sheets that you were given. MRSA Information and Surgical Site Infection Prevention

## 2017-01-21 NOTE — Progress Notes (Signed)
I called a prescription for Mupirocin ointment to Russell Carter, [facility], [place], [place]

## 2017-01-22 LAB — HEMOGLOBIN A1C
HEMOGLOBIN A1C: 6.2 % — AB (ref 4.8–5.6)
Mean Plasma Glucose: 131 mg/dL

## 2017-01-22 NOTE — Progress Notes (Signed)
Anesthesia Chart Review:  Pt is a 61 year old male scheduled for C4-5, C5-6, C6-7 ACDF with C3-T1 laminectomy and foraminotomy with C3-T1 fixation and fusion on 01/24/2017 with Cherrie Distance, MD  - Dolores Lory primary care at [facility] - Cardiologist is Charlton Haws, MD, last office visit 12/26/16 with Norma Fredrickson, NP; she notes BP needed to be controlled before surgery (which it appears to be).    PMH includes:  CAD (DES to mid RCA 02/2016), HTN, DM, hyperlipidemia, stroke, renal insufficiency.  Hx cocaine abuse (reports quitting in 2008, positive for cocaine on UDS 2014). Consumes 40 oz of beer per day. Never smoker. BMI 27.   Medications include: Amlodipine, ASA 81 mg, HCTZ, losartan, rosuvastatin, Brilinta. Last dose Brilinta 01/17/2017   BP (!) 141/80   Pulse 72   Temp 36.8 C (Oral)   Resp 20   Ht 5' 11.5" (1.816 m)   Wt 198 lb 8 oz (90 kg)   SpO2 100%   BMI 27.30 kg/m    Preoperative labs reviewed.   - HbA1c 6.2, glucose 102 - Cr 1.54, BUN 21  CXR 03/13/16: No active cardiopulmonary disease.  EKG 12/26/16: NSR.   Cardiac cath 03/16/16:  1. Mid RCA lesion, 95% stenosed. Post intervention with a Promus DES stent 2.5 mm x 20 mm (2.8 mm), there is a 0% residual stenosis. 2. The left ventricular systolic function is normal. 3. Otherwise minimal CAD  Echo 01/15/13:  - Left ventricle: The cavity size was normal. Wall thickness was increased in a pattern of mild LVH. There was mild focal basal hypertrophy of the septum. Systolic function was normal. The estimated ejection fraction was in the range of 60% to 65%. Wall motion was normal; there were no regional wall motion abnormalities. - Atrial septum: No defect or patent foramen ovale was identified.  If no changes, I anticipate pt can proceed with surgery as scheduled.   Rica Mast, FNP-BC [facility] Short Stay Surgical Center/Anesthesiology Phone: [phone] 01/22/2017 3:08 PM

## 2017-01-23 MED ORDER — CEFAZOLIN SODIUM-DEXTROSE 2-4 GM/100ML-% IV SOLN
2.0000 g | INTRAVENOUS | Status: AC
  Administered 2017-01-24 (×2): 2 g via INTRAVENOUS
  Filled 2017-01-23: qty 100

## 2017-01-23 NOTE — Anesthesia Preprocedure Evaluation (Addendum)
Anesthesia Evaluation  Patient identified by MRN, date of birth, ID band Patient awake    Reviewed: Allergy & Precautions, NPO status , Patient's Chart, lab work & pertinent test results  Airway Mallampati: III  TM Distance: >3 FB Neck ROM: Full    Dental  (+) Dental Advisory Given   Pulmonary neg pulmonary ROS,    breath sounds clear to auscultation       Cardiovascular hypertension, Pt. on medications + CAD, + Past MI and + Cardiac Stents  + dysrhythmias  Rhythm:Regular Rate:Normal  Cardiac cath 03/16/16:  1. Mid RCA lesion, 95% stenosed. Post intervention with a Promus DES stent 2.5 mm x 20 mm (2.8 mm), there is a 0% residual stenosis. 2. The left ventricular systolic function is normal. 3. Otherwise minimal CAD  Echo 01/15/13:  - Left ventricle: The cavity size was normal. Wall thickness was increased in a pattern of mild LVH. There was mild focal basal hypertrophy of the septum. Systolic function was normal. The estimated ejection fraction was in the range of 60% to 65%. Wall motion was normal; there were no regional wall motion abnormalities. - Atrial septum: No defect or patent foramen ovale was identified.   Neuro/Psych  Headaches, CVA    GI/Hepatic negative GI ROS, (+)     substance abuse  cocaine use,   Endo/Other  diabetes, Type 2  Renal/GU Renal disease     Musculoskeletal  (+) Arthritis ,   Abdominal   Peds  Hematology negative hematology ROS (+)   Anesthesia Other Findings   Reproductive/Obstetrics                            Lab Results  Component Value Date   WBC 4.9 01/21/2017   HGB 14.8 01/21/2017   HCT 43.3 01/21/2017   MCV 86.8 01/21/2017   PLT 204 01/21/2017   Lab Results  Component Value Date   CREATININE 1.54 (H) 01/21/2017   BUN 21 (H) 01/21/2017   NA 139 01/21/2017   K 3.8 01/21/2017   CL 105 01/21/2017   CO2 23 01/21/2017   Lab Results  Component  Value Date   INR 1.12 01/21/2017   INR 1.11 03/16/2016   INR 1.14 09/12/2015    Anesthesia Physical Anesthesia Plan  ASA: III  Anesthesia Plan: General   Post-op Pain Management:    Induction: Intravenous  Airway Management Planned: Oral ETT and Video Laryngoscope Planned  Additional Equipment: Arterial line  Intra-op Plan:   Post-operative Plan: Possible Post-op intubation/ventilation  Informed Consent: I have reviewed the patients History and Physical, chart, labs and discussed the procedure including the risks, benefits and alternatives for the proposed anesthesia with the patient or authorized representative who has indicated his/her understanding and acceptance.   Dental advisory given  Plan Discussed with: CRNA  Anesthesia Plan Comments:        Anesthesia Quick Evaluation

## 2017-01-24 ENCOUNTER — Inpatient Hospital Stay (HOSPITAL_COMMUNITY): Payer: Medicaid Other | Admitting: Certified Registered Nurse Anesthetist

## 2017-01-24 ENCOUNTER — Inpatient Hospital Stay (HOSPITAL_COMMUNITY)
Admission: RE | Admit: 2017-01-24 | Discharge: 2017-01-28 | DRG: 455 | Disposition: A | Discharge status: Rehabilitation Facility | Payer: Medicaid Other | Source: Ambulatory Visit | Attending: Neurological Surgery | Admitting: Neurological Surgery

## 2017-01-24 ENCOUNTER — Inpatient Hospital Stay (HOSPITAL_COMMUNITY): Payer: Medicaid Other

## 2017-01-24 ENCOUNTER — Encounter (HOSPITAL_COMMUNITY): Payer: Self-pay | Admitting: Anesthesiology

## 2017-01-24 ENCOUNTER — Encounter (HOSPITAL_COMMUNITY)
Admission: RE | Disposition: A | Discharge status: Rehabilitation Facility | Payer: Self-pay | Source: Ambulatory Visit | Attending: Neurological Surgery

## 2017-01-24 DIAGNOSIS — F329 Major depressive disorder, single episode, unspecified: Secondary | ICD-10-CM

## 2017-01-24 DIAGNOSIS — E119 Type 2 diabetes mellitus without complications: Secondary | ICD-10-CM | POA: Diagnosis not present

## 2017-01-24 DIAGNOSIS — D696 Thrombocytopenia, unspecified: Secondary | ICD-10-CM

## 2017-01-24 DIAGNOSIS — D72829 Elevated white blood cell count, unspecified: Secondary | ICD-10-CM

## 2017-01-24 DIAGNOSIS — I10 Essential (primary) hypertension: Secondary | ICD-10-CM | POA: Diagnosis present

## 2017-01-24 DIAGNOSIS — M5011 Cervical disc disorder with radiculopathy,  high cervical region: Secondary | ICD-10-CM | POA: Diagnosis present

## 2017-01-24 DIAGNOSIS — M4802 Spinal stenosis, cervical region: Secondary | ICD-10-CM | POA: Diagnosis present

## 2017-01-24 DIAGNOSIS — M5410 Radiculopathy, site unspecified: Secondary | ICD-10-CM | POA: Diagnosis not present

## 2017-01-24 DIAGNOSIS — N189 Chronic kidney disease, unspecified: Secondary | ICD-10-CM | POA: Diagnosis not present

## 2017-01-24 DIAGNOSIS — Z91013 Allergy to seafood: Secondary | ICD-10-CM

## 2017-01-24 DIAGNOSIS — I2699 Other pulmonary embolism without acute cor pulmonale: Secondary | ICD-10-CM | POA: Diagnosis not present

## 2017-01-24 DIAGNOSIS — Z87892 Personal history of anaphylaxis: Secondary | ICD-10-CM | POA: Diagnosis not present

## 2017-01-24 DIAGNOSIS — E1165 Type 2 diabetes mellitus with hyperglycemia: Secondary | ICD-10-CM | POA: Diagnosis not present

## 2017-01-24 DIAGNOSIS — Z7982 Long term (current) use of aspirin: Secondary | ICD-10-CM | POA: Diagnosis not present

## 2017-01-24 DIAGNOSIS — M4722 Other spondylosis with radiculopathy, cervical region: Principal | ICD-10-CM | POA: Diagnosis present

## 2017-01-24 DIAGNOSIS — D649 Anemia, unspecified: Secondary | ICD-10-CM

## 2017-01-24 DIAGNOSIS — Z8673 Personal history of transient ischemic attack (TIA), and cerebral infarction without residual deficits: Secondary | ICD-10-CM

## 2017-01-24 DIAGNOSIS — G839 Paralytic syndrome, unspecified: Secondary | ICD-10-CM | POA: Diagnosis not present

## 2017-01-24 DIAGNOSIS — Z8249 Family history of ischemic heart disease and other diseases of the circulatory system: Secondary | ICD-10-CM

## 2017-01-24 DIAGNOSIS — F1410 Cocaine abuse, uncomplicated: Secondary | ICD-10-CM | POA: Diagnosis not present

## 2017-01-24 DIAGNOSIS — Z419 Encounter for procedure for purposes other than remedying health state, unspecified: Secondary | ICD-10-CM

## 2017-01-24 DIAGNOSIS — M1990 Unspecified osteoarthritis, unspecified site: Secondary | ICD-10-CM | POA: Diagnosis present

## 2017-01-24 DIAGNOSIS — M7989 Other specified soft tissue disorders: Secondary | ICD-10-CM | POA: Diagnosis not present

## 2017-01-24 DIAGNOSIS — F32A Depression, unspecified: Secondary | ICD-10-CM

## 2017-01-24 DIAGNOSIS — D62 Acute posthemorrhagic anemia: Secondary | ICD-10-CM | POA: Diagnosis not present

## 2017-01-24 DIAGNOSIS — I2510 Atherosclerotic heart disease of native coronary artery without angina pectoris: Secondary | ICD-10-CM | POA: Diagnosis present

## 2017-01-24 DIAGNOSIS — G8918 Other acute postprocedural pain: Secondary | ICD-10-CM | POA: Diagnosis not present

## 2017-01-24 DIAGNOSIS — I252 Old myocardial infarction: Secondary | ICD-10-CM | POA: Diagnosis not present

## 2017-01-24 DIAGNOSIS — M5412 Radiculopathy, cervical region: Secondary | ICD-10-CM | POA: Diagnosis not present

## 2017-01-24 DIAGNOSIS — R5381 Other malaise: Secondary | ICD-10-CM | POA: Diagnosis not present

## 2017-01-24 DIAGNOSIS — N183 Chronic kidney disease, stage 3 (moderate): Secondary | ICD-10-CM | POA: Diagnosis not present

## 2017-01-24 DIAGNOSIS — Z955 Presence of coronary angioplasty implant and graft: Secondary | ICD-10-CM

## 2017-01-24 HISTORY — PX: ANTERIOR CERVICAL DECOMP/DISCECTOMY FUSION: SHX1161

## 2017-01-24 HISTORY — PX: POSTERIOR CERVICAL FUSION/FORAMINOTOMY: SHX5038

## 2017-01-24 LAB — GLUCOSE, CAPILLARY
GLUCOSE-CAPILLARY: 168 mg/dL — AB (ref 65–99)
Glucose-Capillary: 101 mg/dL — ABNORMAL HIGH (ref 65–99)
Glucose-Capillary: 183 mg/dL — ABNORMAL HIGH (ref 65–99)

## 2017-01-24 LAB — PREPARE RBC (CROSSMATCH)

## 2017-01-24 SURGERY — ANTERIOR CERVICAL DECOMPRESSION/DISCECTOMY FUSION 3 LEVELS
Anesthesia: General | Site: Spine Cervical

## 2017-01-24 MED ORDER — ONDANSETRON HCL 4 MG PO TABS: 4.0000 mg | ORAL_TABLET | Freq: Four times a day (QID) | ORAL | Status: DC | PRN

## 2017-01-24 MED ORDER — SODIUM CHLORIDE 0.9 % IV SOLN: Freq: Once | INTRAVENOUS | Status: DC

## 2017-01-24 MED ORDER — SODIUM CHLORIDE 0.9 % IR SOLN
Status: DC | PRN
  Administered 2017-01-24: 500 mL

## 2017-01-24 MED ORDER — DEXAMETHASONE SODIUM PHOSPHATE 10 MG/ML IJ SOLN
INTRAMUSCULAR | Status: DC | PRN
  Administered 2017-01-24: 10 mg via INTRAVENOUS

## 2017-01-24 MED ORDER — ZOLPIDEM TARTRATE 5 MG PO TABS: 5.0000 mg | ORAL_TABLET | Freq: Every evening | ORAL | Status: DC | PRN

## 2017-01-24 MED ORDER — CEFAZOLIN SODIUM 1 G IJ SOLR
INTRAMUSCULAR | Status: AC
  Filled 2017-01-24: qty 20

## 2017-01-24 MED ORDER — PROPOFOL 10 MG/ML IV BOLUS
INTRAVENOUS | Status: AC
  Filled 2017-01-24: qty 20

## 2017-01-24 MED ORDER — SURGIFOAM 100 EX MISC
CUTANEOUS | Status: DC | PRN
  Administered 2017-01-24: 20 mL via TOPICAL

## 2017-01-24 MED ORDER — LACTATED RINGERS IV SOLN
INTRAVENOUS | Status: DC
  Administered 2017-01-24 (×3): via INTRAVENOUS

## 2017-01-24 MED ORDER — EPHEDRINE 5 MG/ML INJ
INTRAVENOUS | Status: AC
  Filled 2017-01-24: qty 10

## 2017-01-24 MED ORDER — SENNA 8.6 MG PO TABS
1.0000 | ORAL_TABLET | Freq: Two times a day (BID) | ORAL | Status: DC
  Administered 2017-01-24 – 2017-01-28 (×7): 8.6 mg via ORAL
  Filled 2017-01-24 (×8): qty 1

## 2017-01-24 MED ORDER — VANCOMYCIN HCL 1000 MG IV SOLR
INTRAVENOUS | Status: AC
  Filled 2017-01-24: qty 1000

## 2017-01-24 MED ORDER — THROMBIN 5000 UNITS EX SOLR
OROMUCOSAL | Status: DC | PRN
  Administered 2017-01-24: 5 mL via TOPICAL
  Administered 2017-01-24: 13:00:00 via TOPICAL
  Administered 2017-01-24 (×2): 5 mL via TOPICAL

## 2017-01-24 MED ORDER — SODIUM CHLORIDE 0.9% FLUSH: 3.0000 mL | INTRAVENOUS | Status: DC | PRN

## 2017-01-24 MED ORDER — ARTIFICIAL TEARS OP OINT
TOPICAL_OINTMENT | OPHTHALMIC | Status: DC | PRN
  Administered 2017-01-24: 1 via OPHTHALMIC

## 2017-01-24 MED ORDER — SODIUM CHLORIDE 0.9 % IJ SOLN
INTRAMUSCULAR | Status: AC
  Filled 2017-01-24: qty 20

## 2017-01-24 MED ORDER — SODIUM CHLORIDE 0.9 % IV SOLN: 250.0000 mL | INTRAVENOUS | Status: DC

## 2017-01-24 MED ORDER — ONDANSETRON HCL 4 MG/2ML IJ SOLN
INTRAMUSCULAR | Status: AC
  Filled 2017-01-24: qty 2

## 2017-01-24 MED ORDER — PHENYLEPHRINE HCL 10 MG/ML IJ SOLN
INTRAVENOUS | Status: DC | PRN
  Administered 2017-01-24: 25 ug/min via INTRAVENOUS

## 2017-01-24 MED ORDER — FENTANYL CITRATE (PF) 250 MCG/5ML IJ SOLN
INTRAMUSCULAR | Status: AC
  Filled 2017-01-24: qty 5

## 2017-01-24 MED ORDER — ONDANSETRON HCL 4 MG/2ML IJ SOLN: 4.0000 mg | Freq: Four times a day (QID) | INTRAMUSCULAR | Status: DC | PRN

## 2017-01-24 MED ORDER — HYDROMORPHONE HCL 1 MG/ML IJ SOLN
INTRAMUSCULAR | Status: AC
  Filled 2017-01-24: qty 1

## 2017-01-24 MED ORDER — ACETAMINOPHEN 325 MG PO TABS: 650.0000 mg | ORAL_TABLET | ORAL | Status: DC | PRN

## 2017-01-24 MED ORDER — LIDOCAINE-EPINEPHRINE (PF) 2 %-1:200000 IJ SOLN
INTRAMUSCULAR | Status: AC
  Filled 2017-01-24: qty 20

## 2017-01-24 MED ORDER — LIDOCAINE 2% (20 MG/ML) 5 ML SYRINGE
INTRAMUSCULAR | Status: AC
  Filled 2017-01-24: qty 5

## 2017-01-24 MED ORDER — ROCURONIUM BROMIDE 100 MG/10ML IV SOLN
INTRAVENOUS | Status: DC | PRN
Start: 2017-01-24 — End: 2017-01-24
  Administered 2017-01-24: 20 mg via INTRAVENOUS
  Administered 2017-01-24: 50 mg via INTRAVENOUS
  Administered 2017-01-24: 20 mg via INTRAVENOUS
  Administered 2017-01-24: 50 mg via INTRAVENOUS

## 2017-01-24 MED ORDER — THROMBIN 5000 UNITS EX SOLR
CUTANEOUS | Status: AC
  Filled 2017-01-24: qty 5000

## 2017-01-24 MED ORDER — EPHEDRINE SULFATE 50 MG/ML IJ SOLN
INTRAMUSCULAR | Status: DC | PRN
  Administered 2017-01-24 (×2): 5 mg via INTRAVENOUS
  Administered 2017-01-24 (×2): 10 mg via INTRAVENOUS

## 2017-01-24 MED ORDER — ACETAMINOPHEN 650 MG RE SUPP: 650.0000 mg | RECTAL | Status: DC | PRN

## 2017-01-24 MED ORDER — SENNOSIDES-DOCUSATE SODIUM 8.6-50 MG PO TABS
1.0000 | ORAL_TABLET | Freq: Every evening | ORAL | Status: DC | PRN
Start: 2017-01-24 — End: 2017-01-28

## 2017-01-24 MED ORDER — PANTOPRAZOLE SODIUM 40 MG IV SOLR
40.0000 mg | Freq: Every day | INTRAVENOUS | Status: DC
  Administered 2017-01-24: 40 mg via INTRAVENOUS
  Filled 2017-01-24: qty 40

## 2017-01-24 MED ORDER — GABAPENTIN 300 MG PO CAPS
300.0000 mg | ORAL_CAPSULE | Freq: Three times a day (TID) | ORAL | Status: DC
  Administered 2017-01-24 – 2017-01-26 (×6): 300 mg via ORAL
  Filled 2017-01-24 (×6): qty 1

## 2017-01-24 MED ORDER — SODIUM CHLORIDE 0.9% FLUSH
3.0000 mL | Freq: Two times a day (BID) | INTRAVENOUS | Status: DC
  Administered 2017-01-24 – 2017-01-28 (×7): 3 mL via INTRAVENOUS

## 2017-01-24 MED ORDER — HYDROMORPHONE HCL 1 MG/ML IJ SOLN
0.2500 mg | INTRAMUSCULAR | Status: DC | PRN
  Administered 2017-01-24: 0.5 mg via INTRAVENOUS

## 2017-01-24 MED ORDER — HYDROMORPHONE HCL 1 MG/ML IJ SOLN
INTRAMUSCULAR | Status: DC | PRN
  Administered 2017-01-24 (×2): .25 mg via INTRAVENOUS

## 2017-01-24 MED ORDER — PROPOFOL 10 MG/ML IV BOLUS
INTRAVENOUS | Status: DC | PRN
  Administered 2017-01-24: 200 mg via INTRAVENOUS

## 2017-01-24 MED ORDER — ALUM & MAG HYDROXIDE-SIMETH 200-200-20 MG/5ML PO SUSP: 30.0000 mL | Freq: Four times a day (QID) | ORAL | Status: DC | PRN

## 2017-01-24 MED ORDER — THROMBIN 20000 UNITS EX SOLR
CUTANEOUS | Status: AC
  Filled 2017-01-24: qty 20000

## 2017-01-24 MED ORDER — MENTHOL 3 MG MT LOZG
1.0000 | LOZENGE | OROMUCOSAL | Status: DC | PRN
  Filled 2017-01-24: qty 9

## 2017-01-24 MED ORDER — PHENOL 1.4 % MT LIQD
1.0000 | OROMUCOSAL | Status: DC | PRN
Start: 2017-01-24 — End: 2017-01-28
  Administered 2017-01-25: 1 via OROMUCOSAL
  Filled 2017-01-24: qty 177

## 2017-01-24 MED ORDER — BUPIVACAINE HCL (PF) 0.5 % IJ SOLN
INTRAMUSCULAR | Status: DC | PRN
  Administered 2017-01-24: 12.5 mL

## 2017-01-24 MED ORDER — HYDROCODONE-ACETAMINOPHEN 7.5-325 MG PO TABS
1.0000 | ORAL_TABLET | Freq: Four times a day (QID) | ORAL | Status: DC
  Administered 2017-01-24 – 2017-01-27 (×9): 1 via ORAL
  Filled 2017-01-24 (×11): qty 1

## 2017-01-24 MED ORDER — SUGAMMADEX SODIUM 200 MG/2ML IV SOLN
INTRAVENOUS | Status: DC | PRN
  Administered 2017-01-24: 180 mg via INTRAVENOUS

## 2017-01-24 MED ORDER — KETOROLAC TROMETHAMINE 30 MG/ML IJ SOLN
INTRAMUSCULAR | Status: AC
  Filled 2017-01-24: qty 1

## 2017-01-24 MED ORDER — DOCUSATE SODIUM 100 MG PO CAPS
100.0000 mg | ORAL_CAPSULE | Freq: Two times a day (BID) | ORAL | Status: DC
  Administered 2017-01-24 – 2017-01-28 (×7): 100 mg via ORAL
  Filled 2017-01-24 (×8): qty 1

## 2017-01-24 MED ORDER — MIDAZOLAM HCL 2 MG/2ML IJ SOLN
INTRAMUSCULAR | Status: AC
  Filled 2017-01-24: qty 2

## 2017-01-24 MED ORDER — ROCURONIUM BROMIDE 50 MG/5ML IV SOSY
PREFILLED_SYRINGE | INTRAVENOUS | Status: AC
  Filled 2017-01-24: qty 5

## 2017-01-24 MED ORDER — CHLORHEXIDINE GLUCONATE CLOTH 2 % EX PADS: 6.0000 | MEDICATED_PAD | Freq: Once | CUTANEOUS | Status: DC

## 2017-01-24 MED ORDER — DIAZEPAM 5 MG PO TABS
5.0000 mg | ORAL_TABLET | Freq: Four times a day (QID) | ORAL | Status: DC | PRN
  Administered 2017-01-24 – 2017-01-25 (×2): 5 mg via ORAL
  Filled 2017-01-24 (×2): qty 1

## 2017-01-24 MED ORDER — HYDROMORPHONE HCL 1 MG/ML IJ SOLN
1.0000 mg | INTRAMUSCULAR | Status: DC | PRN
  Administered 2017-01-25: 1 mg via INTRAVENOUS
  Filled 2017-01-24 (×2): qty 1

## 2017-01-24 MED ORDER — LIDOCAINE-EPINEPHRINE (PF) 2 %-1:200000 IJ SOLN
INTRAMUSCULAR | Status: DC | PRN
  Administered 2017-01-24: 12.5 mL
  Administered 2017-01-24: 10 mL

## 2017-01-24 MED ORDER — BACITRACIN ZINC 500 UNIT/GM EX OINT
TOPICAL_OINTMENT | CUTANEOUS | Status: AC
  Filled 2017-01-24: qty 28.35

## 2017-01-24 MED ORDER — FLEET ENEMA 7-19 GM/118ML RE ENEM: 1.0000 | ENEMA | Freq: Once | RECTAL | Status: DC | PRN

## 2017-01-24 MED ORDER — 0.9 % SODIUM CHLORIDE (POUR BTL) OPTIME
TOPICAL | Status: DC | PRN
  Administered 2017-01-24: 1000 mL

## 2017-01-24 MED ORDER — ROCURONIUM BROMIDE 50 MG/5ML IV SOSY
PREFILLED_SYRINGE | INTRAVENOUS | Status: AC
  Filled 2017-01-24: qty 10

## 2017-01-24 MED ORDER — BACITRACIN ZINC 500 UNIT/GM EX OINT
TOPICAL_OINTMENT | CUTANEOUS | Status: DC | PRN
  Administered 2017-01-24: 1 via TOPICAL

## 2017-01-24 MED ORDER — PROMETHAZINE HCL 25 MG/ML IJ SOLN: 6.2500 mg | INTRAMUSCULAR | Status: DC | PRN

## 2017-01-24 MED ORDER — MIDAZOLAM HCL 5 MG/5ML IJ SOLN
INTRAMUSCULAR | Status: DC | PRN
  Administered 2017-01-24: 2 mg via INTRAVENOUS

## 2017-01-24 MED ORDER — DEXAMETHASONE SODIUM PHOSPHATE 10 MG/ML IJ SOLN
INTRAMUSCULAR | Status: AC
  Filled 2017-01-24: qty 1

## 2017-01-24 MED ORDER — SODIUM CHLORIDE 0.9 % IV SOLN: INTRAVENOUS | Status: DC

## 2017-01-24 MED ORDER — VANCOMYCIN HCL 1000 MG IV SOLR
INTRAVENOUS | Status: DC | PRN
  Administered 2017-01-24: 1000 mg via TOPICAL

## 2017-01-24 MED ORDER — CEFAZOLIN SODIUM-DEXTROSE 2-4 GM/100ML-% IV SOLN
2.0000 g | Freq: Three times a day (TID) | INTRAVENOUS | Status: AC
  Administered 2017-01-24 – 2017-01-25 (×2): 2 g via INTRAVENOUS
  Filled 2017-01-24 (×2): qty 100

## 2017-01-24 MED ORDER — SODIUM CHLORIDE 0.9 % IJ SOLN
INTRAMUSCULAR | Status: DC | PRN
  Administered 2017-01-24: 10 mL
  Administered 2017-01-24: 20 mL
  Administered 2017-01-24: 10 mL

## 2017-01-24 MED ORDER — OXYCODONE HCL 5 MG PO TABS
5.0000 mg | ORAL_TABLET | ORAL | Status: DC | PRN
  Administered 2017-01-24 – 2017-01-28 (×4): 10 mg via ORAL
  Filled 2017-01-24 (×5): qty 2

## 2017-01-24 MED ORDER — BUPIVACAINE LIPOSOME 1.3 % IJ SUSP
20.0000 mL | INTRAMUSCULAR | Status: AC
  Administered 2017-01-24: 20 mL
  Filled 2017-01-24: qty 20

## 2017-01-24 MED ORDER — FENTANYL CITRATE (PF) 100 MCG/2ML IJ SOLN
INTRAMUSCULAR | Status: DC | PRN
  Administered 2017-01-24 (×4): 50 ug via INTRAVENOUS
  Administered 2017-01-24: 100 ug via INTRAVENOUS
  Administered 2017-01-24: 50 ug via INTRAVENOUS

## 2017-01-24 MED ORDER — ONDANSETRON HCL 4 MG/2ML IJ SOLN
INTRAMUSCULAR | Status: DC | PRN
  Administered 2017-01-24 (×2): 4 mg via INTRAVENOUS

## 2017-01-24 MED ORDER — BISACODYL 10 MG RE SUPP: 10.0000 mg | Freq: Every day | RECTAL | Status: DC | PRN

## 2017-01-24 MED ORDER — LACTATED RINGERS IV SOLN
INTRAVENOUS | Status: DC | PRN
  Administered 2017-01-24: 08:00:00 via INTRAVENOUS

## 2017-01-24 MED ORDER — LIDOCAINE HCL (CARDIAC) 20 MG/ML IV SOLN
INTRAVENOUS | Status: DC | PRN
  Administered 2017-01-24: 80 mg via INTRAVENOUS

## 2017-01-24 SURGICAL SUPPLY — 124 items
ADH SKN CLS APL DERMABOND .7 (GAUZE/BANDAGES/DRESSINGS) ×3
APL SKNCLS STERI-STRIP NONHPOA (GAUZE/BANDAGES/DRESSINGS)
BAG DECANTER FOR FLEXI CONT (MISCELLANEOUS) ×1 IMPLANT
BENZOIN TINCTURE PRP APPL 2/3 (GAUZE/BANDAGES/DRESSINGS) IMPLANT
BIT DRILL INVIZIA (BIT) IMPLANT
BLADE CLIPPER SURG (BLADE) IMPLANT
BLADE ULTRA TIP 2M (BLADE) IMPLANT
BONE EQUIVA 10CC (Bone Implant) ×2 IMPLANT
BUR MATCHSTICK NEURO 3.0 LAGG (BURR) ×3 IMPLANT
BUR PRECISION FLUTE 5.0 (BURR) ×2 IMPLANT
CANISTER SUCT 3000ML PPV (MISCELLANEOUS) ×4 IMPLANT
CAP CLSR POST CERV (Cap) ×24 IMPLANT
CARTRIDGE OIL MAESTRO DRILL (MISCELLANEOUS) ×2 IMPLANT
CHLORAPREP W/TINT 26ML (MISCELLANEOUS) ×6 IMPLANT
CLOSURE WOUND 1/2 X4 (GAUZE/BANDAGES/DRESSINGS)
CONT SPEC STER OR (MISCELLANEOUS) ×2 IMPLANT
COUNTER NEEDLE 20 DBL MAG RED (NEEDLE) ×2 IMPLANT
COVER BACK TABLE 60X90IN (DRAPES) ×1 IMPLANT
DECANTER SPIKE VIAL GLASS SM (MISCELLANEOUS) ×4 IMPLANT
DERMABOND ADVANCED (GAUZE/BANDAGES/DRESSINGS) ×6
DERMABOND ADVANCED .7 DNX12 (GAUZE/BANDAGES/DRESSINGS) ×1 IMPLANT
DIFFUSER DRILL AIR PNEUMATIC (MISCELLANEOUS) ×4 IMPLANT
DRAIN SNY WOU 7FLT (WOUND CARE) ×2 IMPLANT
DRAPE C-ARM 42X72 X-RAY (DRAPES) ×9 IMPLANT
DRAPE C-ARMOR (DRAPES) ×3 IMPLANT
DRAPE HALF SHEET 40X57 (DRAPES) IMPLANT
DRAPE LAPAROTOMY 100X72 PEDS (DRAPES) ×3 IMPLANT
DRAPE MICROSCOPE LEICA (MISCELLANEOUS) ×2 IMPLANT
DRAPE POUCH INSTRU U-SHP 10X18 (DRAPES) ×6 IMPLANT
DRAPE SHEET LG 3/4 BI-LAMINATE (DRAPES) ×5 IMPLANT
DRILL BIT INVIZIA (BIT) ×3
DRSG OPSITE POSTOP 4X10 (GAUZE/BANDAGES/DRESSINGS) ×2 IMPLANT
DRSG OPSITE POSTOP 4X6 (GAUZE/BANDAGES/DRESSINGS) ×2 IMPLANT
DRSG PAD ABDOMINAL 8X10 ST (GAUZE/BANDAGES/DRESSINGS) IMPLANT
ELECT CAUTERY BLADE 6.4 (BLADE) ×2 IMPLANT
ELECT COATED BLADE 2.86 ST (ELECTRODE) ×3 IMPLANT
ELECT REM PT RETURN 9FT ADLT (ELECTROSURGICAL) ×6
ELECTRODE REM PT RTRN 9FT ADLT (ELECTROSURGICAL) ×2 IMPLANT
EVACUATOR 1/8 PVC DRAIN (DRAIN) IMPLANT
EVACUATOR SILICONE 100CC (DRAIN) ×2 IMPLANT
GAUZE SPONGE 4X4 12PLY STRL (GAUZE/BANDAGES/DRESSINGS) ×1 IMPLANT
GAUZE SPONGE 4X4 16PLY XRAY LF (GAUZE/BANDAGES/DRESSINGS) IMPLANT
GLOVE BIO SURGEON STRL SZ7 (GLOVE) IMPLANT
GLOVE BIOGEL PI IND STRL 7.0 (GLOVE) IMPLANT
GLOVE BIOGEL PI IND STRL 7.5 (GLOVE) ×2 IMPLANT
GLOVE BIOGEL PI INDICATOR 7.0 (GLOVE) ×4
GLOVE BIOGEL PI INDICATOR 7.5 (GLOVE) ×6
GLOVE EXAM NITRILE LRG STRL (GLOVE) IMPLANT
GLOVE SS BIOGEL STRL SZ 7.5 (GLOVE) ×3 IMPLANT
GLOVE SS BIOGEL STRL SZ 8 (GLOVE) IMPLANT
GLOVE SUPERSENSE BIOGEL SZ 7.5 (GLOVE) ×8
GLOVE SUPERSENSE BIOGEL SZ 8 (GLOVE) ×2
GOWN STRL REUS W/ TWL LRG LVL3 (GOWN DISPOSABLE) ×1 IMPLANT
GOWN STRL REUS W/ TWL XL LVL3 (GOWN DISPOSABLE) ×1 IMPLANT
GOWN STRL REUS W/TWL 2XL LVL3 (GOWN DISPOSABLE) IMPLANT
GOWN STRL REUS W/TWL LRG LVL3 (GOWN DISPOSABLE) ×21
GOWN STRL REUS W/TWL XL LVL3 (GOWN DISPOSABLE) ×3
HEMOSTAT POWDER KIT SURGIFOAM (HEMOSTASIS) ×3 IMPLANT
HEMOSTAT POWDER SURGIFOAM 1G (HEMOSTASIS) ×1 IMPLANT
HEMOSTAT SURGICEL 2X14 (HEMOSTASIS) IMPLANT
INTERBODY TM 14X14X5-7DEG ANG (Metal Cage) ×2 IMPLANT
INTERBODY TM 14X14X6-7DEG ANG (Metal Cage) ×4 IMPLANT
IV SOD CHL 0.9% 1000ML (IV SOLUTION) ×2 IMPLANT
KIT BASIN OR (CUSTOM PROCEDURE TRAY) ×4 IMPLANT
KIT INFUSE SMALL (Orthopedic Implant) ×2 IMPLANT
KIT ROOM TURNOVER OR (KITS) ×4 IMPLANT
MARKER SKIN DUAL TIP RULER LAB (MISCELLANEOUS) ×1 IMPLANT
MILL MEDIUM DISP (BLADE) ×2 IMPLANT
NDL HYPO 18GX1.5 BLUNT FILL (NEEDLE) IMPLANT
NDL HYPO 21X1.5 SAFETY (NEEDLE) ×2 IMPLANT
NDL SPNL 18GX3.5 QUINCKE PK (NEEDLE) ×1 IMPLANT
NDL SPNL 22GX3.5 QUINCKE BK (NEEDLE) ×1 IMPLANT
NEEDLE HYPO 18GX1.5 BLUNT FILL (NEEDLE) IMPLANT
NEEDLE HYPO 21X1.5 SAFETY (NEEDLE) ×9 IMPLANT
NEEDLE SPNL 18GX3.5 QUINCKE PK (NEEDLE) ×3 IMPLANT
NEEDLE SPNL 22GX3.5 QUINCKE BK (NEEDLE) IMPLANT
NS IRRIG 1000ML POUR BTL (IV SOLUTION) ×4 IMPLANT
OIL CARTRIDGE MAESTRO DRILL (MISCELLANEOUS) ×3
PACK LAMINECTOMY NEURO (CUSTOM PROCEDURE TRAY) ×6 IMPLANT
PACK UNIVERSAL I (CUSTOM PROCEDURE TRAY) ×3 IMPLANT
PAD ARMBOARD 7.5X6 YLW CONV (MISCELLANEOUS) ×11 IMPLANT
PATTIES SURGICAL .5X1.5 (GAUZE/BANDAGES/DRESSINGS) ×3 IMPLANT
PIN DISTRACTION 14MM (PIN) ×6 IMPLANT
PIN MAYFIELD SKULL DISP (PIN) ×3 IMPLANT
PIN THREADED TEMP FIX INVIZIA (PIN) ×1 IMPLANT
PLATE INVIZIA 3 LEV 57MM (Plate) ×2 IMPLANT
PUTTY BONE GRAFT KIT 2.5ML (Bone Implant) ×2 IMPLANT
ROD CVD VIRAGE 3.5X80 (Rod) ×4 IMPLANT
RUBBERBAND STERILE (MISCELLANEOUS) ×4 IMPLANT
SCREW 16MM (Screw) ×12 IMPLANT
SCREW PA CT VIRAGE 4.5X25 (Screw) ×8 IMPLANT
SCREW SPINAL 42.X16MM TITAN (Screw) ×2 IMPLANT
SCREW VIRAGE 3.5X14 (Screw) ×16 IMPLANT
SEALER BIPOLAR AQUA 2.3 (INSTRUMENTS) ×2 IMPLANT
SPONGE INTESTINAL PEANUT (DISPOSABLE) ×3 IMPLANT
SPONGE NEURO XRAY DETECT 1X3 (DISPOSABLE) ×1 IMPLANT
SPONGE SURGIFOAM ABS GEL SZ50 (HEMOSTASIS) ×1 IMPLANT
STAPLER VISISTAT 35W (STAPLE) ×6 IMPLANT
STOCKINETTE 6  STRL (DRAPES) ×2
STOCKINETTE 6 STRL (DRAPES) ×1 IMPLANT
STRIP CLOSURE SKIN 1/2X4 (GAUZE/BANDAGES/DRESSINGS) IMPLANT
STRIP SURGICAL 1 X 6 IN (GAUZE/BANDAGES/DRESSINGS) IMPLANT
STRIP SURGICAL 1/2 X 6 IN (GAUZE/BANDAGES/DRESSINGS) IMPLANT
STRIP SURGICAL 1/4 X 6 IN (GAUZE/BANDAGES/DRESSINGS) IMPLANT
STRIP SURGICAL 3/4 X 6 IN (GAUZE/BANDAGES/DRESSINGS) IMPLANT
SUT STRATAFIX 1PDS 45CM VIOLET (SUTURE) IMPLANT
SUT STRATAFIX MNCRL+ 3-0 PS-2 (SUTURE) ×2
SUT STRATAFIX MONOCRYL 3-0 (SUTURE) ×4
SUT STRATAFIX SPIRAL + 2-0 (SUTURE) IMPLANT
SUT VIC AB 0 CT1 18XCR BRD8 (SUTURE) IMPLANT
SUT VIC AB 0 CT1 8-18 (SUTURE) ×9
SUT VIC AB 2-0 CT1 18 (SUTURE) ×4 IMPLANT
SUT VIC AB 3-0 SH 8-18 (SUTURE) ×5 IMPLANT
SUTURE STRATFX MNCRL+ 3-0 PS-2 (SUTURE) ×1 IMPLANT
SYR 30ML LL (SYRINGE) ×1 IMPLANT
SYR 3ML LL SCALE MARK (SYRINGE) IMPLANT
TOWEL GREEN STERILE (TOWEL DISPOSABLE) ×4 IMPLANT
TOWEL GREEN STERILE FF (TOWEL DISPOSABLE) ×5 IMPLANT
TRAY FOLEY CATH 16FR SILVER (SET/KITS/TRAYS/PACK) ×3 IMPLANT
TRAY FOLEY W/METER SILVER 16FR (SET/KITS/TRAYS/PACK) IMPLANT
TUBE CONNECTING 12'X1/4 (SUCTIONS)
TUBE CONNECTING 12X1/4 (SUCTIONS) ×1 IMPLANT
UNDERPAD 30X30 (UNDERPADS AND DIAPERS) ×1 IMPLANT
WATER STERILE IRR 1000ML POUR (IV SOLUTION) ×4 IMPLANT

## 2017-01-24 NOTE — Anesthesia Postprocedure Evaluation (Signed)
Anesthesia Post Note  Patient: Russell Carter  Procedure(s) Performed: Procedure(s) (LRB): Cervical Four-Five,Cervical Five Six,Cervical Six-Seven Anterior cervical discectomy with fusion and plate with Cervical Three-Thoracic- One Laminectomy/Foraminotomy with Cervical Three-Thoracic- One Fixation and fusion (N/A) Cervical Three-Thoracic One-Laminectomy/Foraminotomy with Cervical Three-Thoracic One Fixation and fusion (N/A)  Patient location during evaluation: PACU Anesthesia Type: General Level of consciousness: awake and alert Pain management: pain level controlled Vital Signs Assessment: post-procedure vital signs reviewed and stable Respiratory status: spontaneous breathing, nonlabored ventilation, respiratory function stable and patient connected to nasal cannula oxygen Cardiovascular status: blood pressure returned to baseline and stable Postop Assessment: no signs of nausea or vomiting Anesthetic complications: no       Last Vitals:  Vitals:   01/24/17 1650 01/24/17 1714  BP: 111/66 121/64  Pulse: 83 85  Resp: 17 18  Temp: 36.3 C 36.8 C    Last Pain:  Vitals:   01/24/17 1714  TempSrc: Oral  PainSc:                  [name]

## 2017-01-24 NOTE — Brief Op Note (Signed)
01/24/2017  3:29 PM  PATIENT:  Russell Carter  61 y.o. male  PRE-OPERATIVE DIAGNOSIS:  Cervical spondylosis with radiculopathy  POST-OPERATIVE DIAGNOSIS:  Cervical Spondylosis with Radiculopathy  PROCEDURE:  Procedure(s) with comments: Cervical Four-Five,Cervical Five Six,Cervical Six-Seven Anterior cervical discectomy with fusion and plate with Cervical Three-Thoracic- One Laminectomy/Foraminotomy with Cervical Three-Thoracic- One Fixation and fusion (N/A) - Part-1 anterior approach Cervical Three-Thoracic One-Laminectomy/Foraminotomy with Cervical Three-Thoracic One Fixation and fusion (N/A) - Part-2 Posterior approach  SURGEON:  Surgeon(s) and Role:    * Truitt Merle, MD - Primary    * Maeola Harman, MD - Assisting  PHYSICIAN ASSISTANT:   ASSISTANTS: Maeola Harman, MD   ANESTHESIA:   general  EBL:  Total I/O In: 1335 [I.V.:1000; Blood:335] Out: 1730 [Urine:520; Blood:1210]  BLOOD ADMINISTERED:none  DRAINS: JP   LOCAL MEDICATIONS USED:  MARCAINE    and LIDOCAINE   SPECIMEN:  No Specimen  DISPOSITION OF SPECIMEN:  N/A  COUNTS:  YES  TOURNIQUET:  * No tourniquets in log *  DICTATION: .Dragon Dictation  PLAN OF CARE: Admit to inpatient   PATIENT DISPOSITION:  PACU - hemodynamically stable.   Delay start of Pharmacological VTE agent (>24hrs) due to surgical blood loss or risk of bleeding: yes

## 2017-01-24 NOTE — Transfer of Care (Signed)
Immediate Anesthesia Transfer of Care Note  Patient: Russell Carter  Procedure(s) Performed: Procedure(s) with comments: Cervical Four-Five,Cervical Five Six,Cervical Six-Seven Anterior cervical discectomy with fusion and plate with Cervical Three-Thoracic- One Laminectomy/Foraminotomy with Cervical Three-Thoracic- One Fixation and fusion (N/A) - Part-1 anterior approach Cervical Three-Thoracic One-Laminectomy/Foraminotomy with Cervical Three-Thoracic One Fixation and fusion (N/A) - Part-2 Posterior approach  Patient Location: PACU  Anesthesia Type:General  Level of Consciousness: awake, alert , oriented and patient cooperative  Airway & Oxygen Therapy: Patient Spontanous Breathing and Patient connected to face mask oxygen  Post-op Assessment: Report given to RN and Post -op Vital signs reviewed and stable  Post vital signs: Reviewed  Last Vitals: 135/69, 77, 18, 97.8, 100% Vitals:   01/24/17 0704 01/24/17 1537  BP: 139/79 130/69  Pulse: 71 95  Resp: 20 (!) 22  Temp: 36.8 C 36.1 C    Last Pain:  Vitals:   01/24/17 1537  TempSrc:   PainSc: Asleep         Complications: No apparent anesthesia complications

## 2017-01-24 NOTE — H&P (Signed)
CC: neck pain with radiation down arms   HPI: Russell Carter is a 61 y.o. male who presents for cervical spondylosis with radiculopathy. He continues to complain of debilitating pain from his neck into his shoulders, arms, and around his upper chest.   He reports he has been given cardiac clearance for surgery.   PMH: Past Medical History:  Diagnosis Date  . Anginal pain (HCC)   . Arthritis   . Blood in stool   . CAD (coronary artery disease)    a. 2011: cath showing mod non-obstructive disease b. 02/2016: cath with 95% stenosis in the Mid RCA. DES placed.  . Cervical stenosis of spine   . Cocaine abuse    history  . Depression   . Diabetes mellitus without complication (HCC)   . Gout, unspecified   . Headache   . HTN (hypertension)   . Hypercholesterolemia   . Hyperglycemia   . Migraines   . Myocardial infarction 2008   drug cocaine and marijuana at time of mi none  since    . Pneumonia   . Renal insufficiency   . Stroke [facility])     PSH: Past Surgical History:  Procedure Laterality Date  . CARDIAC CATHETERIZATION  2008; ~ 2011   [place]-40%lad  . CARDIAC CATHETERIZATION N/A 03/16/2016   Procedure: Left Heart Cath and Coronary Angiography;  Surgeon: Marykay Lex, MD;  Location: [facility] INVASIVE CV LAB;  Service: Cardiovascular;  Laterality: N/A;  . CORONARY ANGIOPLASTY     STENTS  . EYE SURGERY Bilateral    "for double vision"  . INGUINAL HERNIA REPAIR  05/12/2012   Procedure: HERNIA REPAIR INGUINAL ADULT;  Surgeon: Liz Malady, MD;  Location: [facility];  Service: General;  Laterality: Right;  . INGUINAL HERNIA REPAIR Left 02/02/2003   Dr. Violeta Gelinas. repair with mesh. Same procedure done again in  05/22/2004   . INGUINAL HERNIA REPAIR Left ?date 2nd OR  . RECONSTRUCT / STABILIZE DISTAL ULNA    . SHOULDER OPEN ROTATOR CUFF REPAIR Left 2010  . SHOULDER SURGERY Left    "injured on the job; had to cut small bone out"  . THUMB FUSION Right      SH: Social History  Substance Use Topics  . Smoking status: Never Smoker  . Smokeless tobacco: Never Used  . Alcohol use 13.8 oz/week    23 Cans of beer per week     Comment: 03/14/2016 "40oz beer/day"    MEDS: Prior to Admission medications   Medication Sig Start Date End Date Taking? Authorizing Provider  allopurinol (ZYLOPRIM) 300 MG tablet Take 1 tablet (300 mg total) by mouth daily. 03/17/16  Yes Ellsworth Lennox, PA  aspirin EC 81 MG EC tablet Take 1 tablet (81 mg total) by mouth daily. 03/17/16  Yes Ellsworth Lennox, PA  CALCIUM-VITAMIN D PO Take 1 tablet by mouth daily.   Yes Historical Provider, MD  clotrimazole (LOTRIMIN) 1 % cream Apply 1 application topically daily as needed (athlete's foot).   Yes Historical Provider, MD  cyclobenzaprine (FLEXERIL) 10 MG tablet TAKE ONE TABLET BY MOUTH THREE TIMES DAILY AS NEEDED FOR MUSCLE SPASMS 08/06/16  Yes Wendall Stade, MD  diphenhydrAMINE (BENADRYL) 25 MG tablet Take 25 mg by mouth every 6 (six) hours as needed for itching.   Yes Historical Provider, MD  gabapentin (NEURONTIN) 300 MG capsule Take 300 mg by mouth 3 (three) times daily.   Yes Historical Provider, MD  hydrochlorothiazide (HYDRODIURIL) 25 MG  tablet Take 1 tablet (25 mg total) by mouth daily. 03/29/16  Yes Leone Brand, NP  HYDROcodone-acetaminophen (NORCO/VICODIN) 5-325 MG tablet Take 1-2 tablets by mouth every 4 (four) hours as needed for moderate pain or severe pain. 03/29/16  Yes Leone Brand, NP  losartan (COZAAR) 100 MG tablet Take 1 tablet (100 mg total) by mouth daily. 01/04/17  Yes Wendall Stade, MD  meclizine (ANTIVERT) 25 MG tablet Take 1 tablet (25 mg total) by mouth 3 (three) times daily as needed. For vertigo 03/17/16  Yes Ellsworth Lennox, [place]  Multiple Vitamin (MULTIVITAMIN WITH MINERALS) TABS tablet Take 1 tablet by mouth daily.   Yes Historical Provider, MD  nitroGLYCERIN (NITROSTAT) 0.4 MG SL tablet Place 1 tablet (0.4 mg total) under the tongue  every 5 (five) minutes as needed for chest pain. 03/29/16  Yes Leone Brand, NP  Polyethyl Glycol-Propyl Glycol (SYSTANE OP) Apply 1 drop to eye 3 (three) times daily.   Yes Historical Provider, MD  rosuvastatin (CRESTOR) 40 MG tablet Take 1 tablet (40 mg total) by mouth daily. 01/04/17 04/04/17 Yes Wendall Stade, MD  ticagrelor (BRILINTA) 90 MG TABS tablet Take 1 tablet (90 mg total) by mouth 2 (two) times daily. 03/29/16  Yes Leone Brand, NP  amLODipine (NORVASC) 5 MG tablet Take 1 tablet (5 mg total) by mouth daily. 01/18/17   Wendall Stade, MD    ALLERGY: Allergies  Allergen Reactions  . Shellfish Allergy Anaphylaxis    ROS:  Review of Systems  Constitutional: Positive for malaise/fatigue. Negative for fever.  HENT: Negative.   Eyes: Negative.   Respiratory: Negative.   Cardiovascular: Negative.   Gastrointestinal: Negative.   Genitourinary: Negative.   Musculoskeletal: Positive for back pain, myalgias and neck pain.  Skin: Negative.   Neurological: Positive for headaches. Negative for dizziness, tingling, tremors, sensory change, speech change, focal weakness, seizures and loss of consciousness.    General appearance: NAD, WDWN Eyes: PERRL, Fundoscopic: normal Cardiovascular: Regular rate and rhythm without murmurs, rubs, gallops. No edema or variciosities. Distal pulses normal. Pulmonary: Clear to auscultation Musculoskeletal:     Gait: normal    Muscle tone upper extremities: Normal    Muscle tone lower extremities: Normal    Motor exam: MAE well. Strength appropriate.  Neurological Awake, alert, oriented Memory and concentration grossly intact Speech fluent, appropriate CNII: Visual fields normal CNIII/IV/VI: EOMI CNV: Facial sensation normal CNVII: Symmetric, normal strength CNVIII: Grossly normal CNIX: Normal palate movement CNXI: Trap and SCM strength normal CN XII: Tongue protrusion normal Sensation grossly intact to LT DTR: Normal  IMAGING: I reviewed  his images including Xray and MRI. These show profound cervical spondylosis with severe foraminal stenosis bilaterally from C3-T1.   IMPRESSION/PLAN - 61 y.o. male with cervical radiculopathy without myelopathy from C3-T1.  I have recommended C4-7 anterior discectomy with fusion and plate fixation and C3-T1 laminectomy with foraminotomies and fusion.  We had a long discussion about risks, benefits, and alternatives and he wishes to proceed.  Post-operatively he will have therapy while in the inpatient setting and he will wear a neck brace for 2-3 months

## 2017-01-25 ENCOUNTER — Encounter (HOSPITAL_COMMUNITY): Payer: Self-pay | Admitting: Neurological Surgery

## 2017-01-25 LAB — BASIC METABOLIC PANEL
Anion gap: 10 (ref 5–15)
BUN: 17 mg/dL (ref 6–20)
CO2: 23 mmol/L (ref 22–32)
Calcium: 8 mg/dL — ABNORMAL LOW (ref 8.9–10.3)
Chloride: 103 mmol/L (ref 101–111)
Creatinine, Ser: 1.43 mg/dL — ABNORMAL HIGH (ref 0.61–1.24)
GFR calc Af Amer: 60 mL/min — ABNORMAL LOW (ref >60)
GFR calc non Af Amer: 51 mL/min — ABNORMAL LOW (ref >60)
Glucose, Bld: 159 mg/dL — ABNORMAL HIGH (ref 65–99)
Potassium: 4.2 mmol/L (ref 3.5–5.1)
Sodium: 136 mmol/L (ref 135–145)

## 2017-01-25 LAB — CBC
HCT: 34.3 % — ABNORMAL LOW (ref 39.0–52.0)
Hemoglobin: 11.8 g/dL — ABNORMAL LOW (ref 13.0–17.0)
MCH: 30.3 pg (ref 26.0–34.0)
MCHC: 34.4 g/dL (ref 30.0–36.0)
MCV: 88.2 fL (ref 78.0–100.0)
Platelets: 134 10*3/uL — ABNORMAL LOW (ref 150–400)
RBC: 3.89 MIL/uL — ABNORMAL LOW (ref 4.22–5.81)
RDW: 15.3 % (ref 11.5–15.5)
WBC: 13.3 10*3/uL — ABNORMAL HIGH (ref 4.0–10.5)

## 2017-01-25 LAB — POCT I-STAT 4, (NA,K, GLUC, HGB,HCT)
Glucose, Bld: 174 mg/dL — ABNORMAL HIGH (ref 65–99)
HEMATOCRIT: 34 % — AB (ref 39.0–52.0)
Hemoglobin: 11.6 g/dL — ABNORMAL LOW (ref 13.0–17.0)
Potassium: 4.1 mmol/L (ref 3.5–5.1)
SODIUM: 139 mmol/L (ref 135–145)

## 2017-01-25 LAB — GLUCOSE, CAPILLARY
GLUCOSE-CAPILLARY: 117 mg/dL — AB (ref 65–99)
GLUCOSE-CAPILLARY: 152 mg/dL — AB (ref 65–99)
GLUCOSE-CAPILLARY: 129 mg/dL — AB (ref 65–99)
Glucose-Capillary: 152 mg/dL — ABNORMAL HIGH (ref 65–99)

## 2017-01-25 LAB — PROTIME-INR
INR: 1.12
Prothrombin Time: 14.5 seconds (ref 11.4–15.2)

## 2017-01-25 LAB — APTT: aPTT: 24 s (ref 24–36)

## 2017-01-25 MED ORDER — ACETAMINOPHEN 500 MG PO TABS
1000.0000 mg | ORAL_TABLET | Freq: Four times a day (QID) | ORAL | Status: DC
  Administered 2017-01-25 – 2017-01-26 (×5): 1000 mg via ORAL
  Filled 2017-01-25 (×7): qty 2

## 2017-01-25 MED ORDER — METHOCARBAMOL 750 MG PO TABS
750.0000 mg | ORAL_TABLET | Freq: Four times a day (QID) | ORAL | Status: DC
  Administered 2017-01-25 – 2017-01-27 (×8): 750 mg via ORAL
  Filled 2017-01-25 (×8): qty 1

## 2017-01-25 MED ORDER — PANTOPRAZOLE SODIUM 40 MG PO TBEC
40.0000 mg | DELAYED_RELEASE_TABLET | Freq: Every day | ORAL | Status: DC
  Administered 2017-01-25 – 2017-01-27 (×3): 40 mg via ORAL
  Filled 2017-01-25 (×3): qty 1

## 2017-01-25 MED ORDER — OXYCODONE HCL ER 20 MG PO T12A: 20.0000 mg | EXTENDED_RELEASE_TABLET | Freq: Two times a day (BID) | ORAL | Status: DC

## 2017-01-25 MED ORDER — OXYCODONE HCL ER 15 MG PO T12A
30.0000 mg | EXTENDED_RELEASE_TABLET | Freq: Two times a day (BID) | ORAL | Status: DC
  Administered 2017-01-25 – 2017-01-28 (×7): 30 mg via ORAL
  Filled 2017-01-25 (×7): qty 2

## 2017-01-25 MED ORDER — DEXAMETHASONE SODIUM PHOSPHATE 4 MG/ML IJ SOLN
2.0000 mg | Freq: Three times a day (TID) | INTRAMUSCULAR | Status: AC
  Administered 2017-01-25 (×3): 2 mg via INTRAVENOUS
  Filled 2017-01-25 (×3): qty 1

## 2017-01-25 MED FILL — Thrombin For Soln 5000 Unit: CUTANEOUS | Qty: 5000 | Status: AC

## 2017-01-25 NOTE — Plan of Care (Signed)
Problem: Bladder/Genitourinary: Goal: Urinary functional status for postoperative course will improve Outcome: Progressing Will discontinue foley in am

## 2017-01-25 NOTE — Care Management Note (Addendum)
Case Management Note  Patient Details  Name: Tanush Debell MRN: [id number] Date of Birth: April 03, 1956  Subjective/Objective:                 Post op:C4-7 anterior discectomy with fusion and plate fixation; posterior cervical fusion with bilateral lateral mass screws C3-6 and bilateral pedicle screws C7 and T1; laminectomy C3-T1 for decompression with bilateral foraminotomies C3-T1; use of BMP. PT eval rec DME 3in1 and RW. Consulted HH to see if patient diagnosis surgery qualifies for services through medicaid. Patient states he lives at home with his mother and brother.    Action/Plan:  Will need DME 3in1 and RW, CM verified that [facility] PT would not covered for his diagnosis, patient not eligible for charity [facility].  Referral made to [facility] for delivery of 3in1 and RW on hold as CIR eval pending.  Expected Discharge Date:                  Expected Discharge Plan:     In-House Referral:     Discharge planning Services  CM Consult  Post Acute Care Choice:  Durable Medical Equipment Choice offered to:     DME Arranged:    DME Agency:     HH Arranged:    HH Agency:     Status of Service:  In process, will continue to follow  If discussed at Long Length of Stay Meetings, dates discussed:    Additional Comments:  Lawerance Sabal, RN 01/25/2017, 9:34 AM

## 2017-01-25 NOTE — Op Note (Signed)
01/24/2017  9:16 AM  PATIENT:  Russell Carter  61 y.o. male  PRE-OPERATIVE DIAGNOSIS:  Cervical spondylosis with radiculopathy; severe foraminal stenosis bilaterally C3-T1  POST-OPERATIVE DIAGNOSIS:  Same  PROCEDURE:  C4-7 anterior discectomy with fusion and plate fixation; posterior cervical fusion with bilateral lateral mass screws C3-6 and bilateral pedicle screws C7 and T1; laminectomy C3-T1 for decompression with bilateral foraminotomies C3-T1; use of BMP  SURGEON:  Hulan Saas, MD  ASSISTANTS: Maeola Harman, MD; Cindra Presume, PA-C  ANESTHESIA:   General  DRAINS: JP anteriorly   SPECIMEN:  None  INDICATION FOR PROCEDURE: 61 year old male with debilitating cervical radicular pain and profound foraminal stenosis C3-T1. Patient understood the risks, benefits, and alternatives and potential outcomes and wished to proceed.  PROCEDURE DETAILS: Patient was brought to the operating room placed under general endotracheal anesthesia. Patient was placed in the supine position on the operating room table. The neck was prepped with betadine and chloraprep and draped in a sterile fashion.   A transverse incision was made on the right side of the neck. Dissection was carried down thru the subcutaneous tissue and the platysma was elevated, opened vertically, and undermined with Metzenbaum scissors. Dissection was then carried out thru an avascular plane leaving the sternocleidomastoid, carotid artery, and jugular vein laterally and the trachea and esophagus medially. The ventral aspect of the vertebral column was identified and a localizing x-ray was taken. The C4-5 level was identified. The longus colli muscles were then elevated and the retractor was placed. The annulus was incised and the disc space entered. Discectomy was performed with micro-curettes and pituitary and kerrison rongeurs. I then used the high-speed drill to drill the endplates down to the level of the posterior  longitudinal ligament. The operating microscope was draped and brought into the field provided additional magnification, illumination and visualization. Utilizing microsurgical technique, discectomy was continued posteriorly thru the disc space. Posterior longitudinal ligament was opened with a nerve hook, and then removed along with disc herniation and osteophytes, decompressing the spinal canal and thecal sac. We then continued to remove osteophytic overgrowth and disc material decompressing the neural foramina and exiting nerve roots bilaterally. So by both visualization and palpation we felt we had an adequate decompression of the neural elements. We then measured the height of the intravertebral disc space and selected a lordotic trabecular metal interbody spacer packed with demineralized bone matrix and hydroxyapatite. It was then gently positioned in the intravertebral disc space and countersunk.   The superior distraction pin was then removed and bone bleeding was stopped with bone wax. The distraction pin was inserted at the next inferior level. The annulus was incised and the disc space entered. Discectomy was performed with micro-curettes and pituitary and kerrison rongeurs. I then used the high-speed drill to drill the endplates down to the level of the posterior longitudinal ligament. The operating microscope was returned to the field.  The discectomy was continued posteriorly thru the disc space. Posterior longitudinal ligament was opened with a nerve hook, and then removed along with disc herniation and osteophytes, decompressing the spinal canal and thecal sac. We then continued to remove osteophytic overgrowth and disc material decompressing the neural foramina and exiting nerve roots bilaterally. So by both visualization and palpation we felt we had an adequate decompression of the neural elements. We then measured the height of the intravertebral disc space and selected a second lordotic  trabecular metal interbody spacer packed with demineralized bone matrix and hydroxyapatite. It was then gently  positioned in the intravertebral disc space and countersunk.   The superior distraction pin was then again removed and bone bleeding was stopped with bone wax. The distraction pin was inserted at the most inferior level. The annulus was incised and the disc space entered. Discectomy was performed with micro-curettes and pituitary and kerrison rongeurs. I then used the high-speed drill to drill the endplates down to the level of the posterior longitudinal ligament. The operating microscope was returned to the field.  The discectomy was continued posteriorly thru the disc space. Posterior longitudinal ligament was opened with a nerve hook, and then removed along with disc herniation and osteophytes, decompressing the spinal canal and thecal sac. We then continued to remove osteophytic overgrowth and disc material decompressing the neural foramina and exiting nerve roots bilaterally. So by both visualization and palpation we felt we had an adequate decompression of the neural elements. We then measured the height of the intravertebral disc space and selected a third lordotic trabecular metal interbody spacer packed with demineralized bone matrix and hydroxyapatite. It was then gently positioned in the intravertebral disc space and countersunk.   Lateral fluoroscopy revealed the cages were relatively anterior and were sitting on large osteophytes.  They were tamped deeper within the disc space and the osteophytes at all levels were resected with a Leksell rongeur, osteophyte tool, and drill.  I then inserted a titanium plate and placed six variable angle screws into the three superior vertebral bodies and two fixed angle screws at the inferior level and locked them into position. The wound was irrigated with bacitracin solution, checked for hemostasis which was established and confirmed. Once meticulous  hemostasis was achieved a JP drain was placed. The platysma was closed with interrupted 3-0 undyed Vicryl suture, the subcuticular layer was closed with running monocryl suture. The skin edges were approximated with dermabond. The drapes were removed. A sterile dressing was applied.   The patient was then placed in [place] pins and turned prone on the operating table. They were positioned in reverse Trendelenburg with knees flexed and head was fixated to the operative table.The patient was prepped and draped in the usual sterile fashion.   A midline incision was made from below the inion to approximately T1. A subperiosteal dissection was used to expose the spine from C3 to T1 with extreme caution taken to not expose the C2-3 or T1-2 facet joints. C3 was identified using fluoroscopy. Then lateral mass screws were placed in C3 to 6 bilaterally using anatomic landmarks. Wide laminectomies were performed from inferior C3 to the superior portion of T1.  Then at C7 and T1 bilateral pedicle screws were placed using anatomic landmarks and the ability to directly palpate the pedicle on each side.   The high speed drill was used to thin the facet joints bilaterally from C3 to T1.  I then used a small Kerrison rongeur to resect the facets at each level and decompress the nerve roots.   The nerves were under tremendous amounts of compression and there was barely room to fit the footplate of the Kerrison into the foramina at most levels.  The nerves were decompressed without apparent injury.  A rod was placed within the tulip heads on each side and secured with set screw caps. I irrigated vigorously with bacitracin saline.  The lateral masses and facet joints at each level as well as the remaining lamina at T1 was decorticated with a high-speed drill. The wound was vigorously irrigated with antibiotic irrigation. BMP sponges were  placed along the decorticated surfaces lateral to the hardware.  Fusion substrate was  inserted along the decorticated area which consisted of locally harvested autograft as well as and morcellized allograft and hydroxyapatite.   The setscrews were finally tightened. Vancomycin powder was placed in the wound. The wound was closed in multiple layers with interrupted sutures. The skin was closed with a running subcuticular stratafix monocryl suture. Dermabond was used to close the skin. The drapes were then taken down patient was returned to the prone position in the Mayfield pins were removed.  He awoke without complication.  PATIENT DISPOSITION:  PACU - hemodynamically stable.   Delay start of Pharmacological VTE agent (>24hrs) due to surgical blood loss or risk of bleeding:  yes

## 2017-01-25 NOTE — Progress Notes (Signed)
Pt seen and examined. No issues overnight.  EXAM: Temp:  [97 F (36.1 C)-98.4 F (36.9 C)] 98.4 F (36.9 C) (04/06 0545) Pulse Rate:  [77-95] 85 (04/06 0545) Resp:  [17-23] 20 (04/06 0545) BP: (111-130)/(64-81) 116/67 (04/06 0545) SpO2:  [95 %-100 %] 95 % (04/06 0545) Arterial Line BP: (132-137)/(54-59) 136/58 (04/05 1610) Weight:  [89.8 kg (197 lb 15.6 oz)] 89.8 kg (197 lb 15.6 oz) (04/05 1714) Intake/Output      04/05 0701 - 04/06 0700 04/06 0701 - 04/07 0700   P.O.  160   I.V. (mL/kg) 2700 (30.1)    Blood 335    Total Intake(mL/kg) 3035 (33.8) 160 (1.8)   Urine (mL/kg/hr) 1870 (0.9)    Drains 72 (0)    Blood 1210 (0.6)    Total Output 3152     Net -117 +160         Awake and alert Follows commands throughout Bilateral deltoid weakness, worse on the left; not unexpected.  Otherwise full strength.  Stable Continue current care Will modify pain meds PT/OT

## 2017-01-25 NOTE — Progress Notes (Signed)
OT Cancellation Note  Patient Details Name: Russell Carter MRN: [id number] DOB: 1956/06/10   Cancelled Treatment:    Reason Eval/Treat Not Completed: Pain limiting ability to participate. Pt stated that he "just got back to bed and couldn't move again" OT offered AE education sitting at the EOB and Pt declined stating "I hurt too much, I just want to rest" and refusing to open eyes.  Evern Bio Lavina Resor 01/25/2017, 4:06 PM  Sherryl Manges OTR/L [phone]

## 2017-01-25 NOTE — Evaluation (Signed)
Physical Therapy Evaluation Patient Details Name: Russell Carter MRN: [id number] DOB: 1956-06-29 Today's Date: 01/25/2017   History of Present Illness  61 y.o. male S/P C4-C7 ACDF and C3-T1 laminectomy with PMH including spondylosis, radiculopathy, MI, stroke, HTN, DM, arthritis, ETOH, drug abuse, L RCR, migraine, and renal insufficiency.  Clinical Impression  Pt was in 8/10 pain today and was slightly lethargic due to medications and pain.  Despite his symptoms, he required min guard for safe ambulation 160 ft with RW.  He was educated on precautions and safe functional movements, but was unable to recall precautions.  He will benefit from continued PT services at the acute level with an emphasis on precaution education, bed mobility, transfers, and DME instruction (see PT Problem List).  His biggest limiting factor seems to be pain at this time.  Recommending SNF at this time, HHPT would be appropriate if pt is progressing well towards PT goals during his stay.    Follow Up Recommendations SNF;Supervision/Assistance - 24 hour    Equipment Recommendations  Rolling walker with 5" wheels;3in1 (PT)    Recommendations for Other Services       Precautions / Restrictions Precautions Precautions: Cervical Precaution Comments: Pt unable to recall precautions after education and handout review. Required Braces or Orthoses: Cervical Brace Cervical Brace: Hard collar Restrictions Weight Bearing Restrictions: No      Mobility  Bed Mobility Overal bed mobility: Needs Assistance Bed Mobility: Sit to Sidelying;Rolling Rolling: Min guard       Sit to sidelying: Min guard General bed mobility comments: VC's for log roll technique and min guard for safety.  Transfers Overall transfer level: Needs assistance Equipment used: None Transfers: Sit to/from Stand Sit to Stand: Min assist         General transfer comment: extra time needed to scoot to edge of chair/shift weight forward because  of pain. Min physical assist for power up.  VC's for hand placement and to minimize arm use.  Ambulation/Gait Ambulation/Gait assistance: Min guard Ambulation Distance (Feet): 160 Feet Assistive device: Rolling walker (2 wheeled) Gait Pattern/deviations: Step-through pattern;Decreased stride length;Trunk flexed;Narrow base of support Gait velocity: decreased Gait velocity interpretation: Below normal speed for age/gender General Gait Details: VC's for posture and RW management.  Pt requested to use RW to steady during ambulation. Utilized chair follow in room because nurse stated he was dizzy/lightheaded with standing up earlier.  Once to door, pt stated he no longer needed chair follow. Min guard for safety.  Stairs            Wheelchair Mobility    Modified Rankin (Stroke Patients Only)       Balance Overall balance assessment: Needs assistance Sitting-balance support: Bilateral upper extremity supported Sitting balance-Leahy Scale: Good     Standing balance support: No upper extremity supported Standing balance-Leahy Scale: Fair Standing balance comment: Pt stood without RW but wanted it for ambulation.                             Pertinent Vitals/Pain Pain Assessment: 0-10 Pain Score: 8  Pain Location: Neck/surgical site Pain Descriptors / Indicators: Constant;Sharp Pain Intervention(s): Limited activity within patient's tolerance;Monitored during session;Repositioned    Home Living Family/patient expects to be discharged to:: Private residence Living Arrangements: Parent;Other relatives (mother and brother) Available Help at Discharge: Family Type of Home: House Home Access: Stairs to enter Entrance Stairs-Rails: None Entrance Stairs-Number of Steps: 1 Home Layout: One level Home Equipment:  Other (comment) (chair for shower)      Prior Function Level of Independence: Independent               Hand Dominance   Dominant Hand: Right     Extremity/Trunk Assessment   Upper Extremity Assessment Upper Extremity Assessment: Generalized weakness;LUE deficits/detail;RUE deficits/detail RUE Deficits / Details: Numb, heavy, and weak. RUE Sensation: decreased light touch;decreased proprioception RUE Coordination: decreased fine motor;decreased gross motor LUE Deficits / Details: Pt able to squeeze therapist fingers, but struggled to lift L arm.  Pt states both UE's numb and heavy, but more in L than R. LUE Sensation: decreased light touch LUE Coordination: decreased fine motor;decreased gross motor    Lower Extremity Assessment Lower Extremity Assessment: Generalized weakness    Cervical / Trunk Assessment Cervical / Trunk Assessment: Kyphotic  Communication   Communication: No difficulties  Cognition Arousal/Alertness: Suspect due to medications Behavior During Therapy: WFL for tasks assessed/performed Overall Cognitive Status: Difficult to assess                                 General Comments: Pt closed eyes often but answered all questions and followed commands well.        General Comments General comments (skin integrity, edema, etc.): Pt in a lot of pain.  PA came in and requested nurse to increase pain med dose. Pt closed his eyes often and appeared drowsy from medications.    Exercises     Assessment/Plan    PT Assessment Patient needs continued PT services  PT Problem List Decreased strength;Decreased range of motion;Decreased activity tolerance;Decreased balance;Decreased mobility;Decreased coordination;Decreased knowledge of use of DME;Decreased knowledge of precautions;Decreased safety awareness;Impaired sensation;Pain       PT Treatment Interventions DME instruction;Gait training;Stair training;Functional mobility training;Therapeutic activities;Therapeutic exercise;Neuromuscular re-education;Patient/family education    PT Goals (Current goals can be found in the Care Plan section)   Acute Rehab PT Goals Patient Stated Goal: No more pain PT Goal Formulation: With patient Time For Goal Achievement: 02/08/17 Potential to Achieve Goals: Fair    Frequency Min 5X/week   Barriers to discharge        Co-evaluation               End of Session Equipment Utilized During Treatment: Gait belt;Cervical collar Activity Tolerance: Patient limited by pain;Patient limited by lethargy Patient left: in bed;with call bell/phone within reach;with bed alarm set Nurse Communication: Mobility status PT Visit Diagnosis: Other abnormalities of gait and mobility (R26.89);Pain Pain - part of body:  (neck)    Time: 1657-9038 PT Time Calculation (min) (ACUTE ONLY): 30 min   Charges:         PT G Codes:        Willaim Rayas SPT  Willaim Rayas 01/25/2017, 12:09 PM

## 2017-01-26 LAB — CBC
HCT: 29.4 % — ABNORMAL LOW (ref 39.0–52.0)
Hemoglobin: 9.6 g/dL — ABNORMAL LOW (ref 13.0–17.0)
MCH: 28.9 pg (ref 26.0–34.0)
MCHC: 32.7 g/dL (ref 30.0–36.0)
MCV: 88.6 fL (ref 78.0–100.0)
PLATELETS: 146 10*3/uL — AB (ref 150–400)
RBC: 3.32 MIL/uL — ABNORMAL LOW (ref 4.22–5.81)
RDW: 14.7 % (ref 11.5–15.5)
WBC: 13.6 10*3/uL — ABNORMAL HIGH (ref 4.0–10.5)

## 2017-01-26 LAB — GLUCOSE, CAPILLARY
GLUCOSE-CAPILLARY: 148 mg/dL — AB (ref 65–99)
Glucose-Capillary: 86 mg/dL (ref 65–99)
Glucose-Capillary: 90 mg/dL (ref 65–99)
Glucose-Capillary: 101 mg/dL — ABNORMAL HIGH (ref 65–99)

## 2017-01-26 LAB — BASIC METABOLIC PANEL
Anion gap: 10 (ref 5–15)
BUN: 19 mg/dL (ref 6–20)
CHLORIDE: 100 mmol/L — AB (ref 101–111)
CO2: 25 mmol/L (ref 22–32)
CREATININE: 1.46 mg/dL — AB (ref 0.61–1.24)
Calcium: 8 mg/dL — ABNORMAL LOW (ref 8.9–10.3)
GFR calc Af Amer: 58 mL/min — ABNORMAL LOW (ref >60)
GFR, EST NON AFRICAN AMERICAN: 50 mL/min — AB (ref >60)
GLUCOSE: 117 mg/dL — AB (ref 65–99)
Potassium: 4 mmol/L (ref 3.5–5.1)
SODIUM: 135 mmol/L (ref 135–145)

## 2017-01-26 NOTE — Evaluation (Signed)
Occupational Therapy Evaluation Patient Details Name: Russell Carter MRN: [id number] DOB: 19-Oct-1956 Today's Date: 01/26/2017    History of Present Illness 61 y.o. male S/P C4-C7 ACDF and C3-T1 laminectomy with PMH including spondylosis, radiculopathy, MI, stroke, HTN, DM, arthritis, ETOH, drug abuse, L RCR, migraine, and renal insufficiency.   Clinical Impression   PTA, pt was independent with ADL and functional mobility. Pt currently requires max assist for dressing/bathing tasks and moderate assistance for self-feeding tasks. He is significantly limited in functional UE use and ADL independence by decreased motor planning skills, L UE decreased sensation, B decreased coordination (L>R), and significant deltoid weakness (L>R). Pt would benefit from continued OT services while admitted to improve independence with ADL and functional mobility. Feel pt would benefit from CIR level therapies post-acute D/C to maximize return to PLOF. Will continue to follow acutely.    Follow Up Recommendations  CIR    Equipment Recommendations  Other (comment) (TBD at next venue of care)    Recommendations for Other Services       Precautions / Restrictions Precautions Precautions: Cervical;Fall Precaution Comments: Pt able to verbalize precautions at beginning of session. Required Braces or Orthoses: Cervical Brace Cervical Brace: Hard collar Restrictions Weight Bearing Restrictions: No      Mobility Bed Mobility Overal bed mobility: Needs Assistance Bed Mobility: Sit to Sidelying;Sidelying to Sit;Rolling Rolling: Min assist Sidelying to sit: Min assist     Sit to sidelying: Min assist General bed mobility comments: Min assist to raise trunk and initiate movements.  Transfers Overall transfer level: Needs assistance Equipment used: Rolling walker (2 wheeled) Transfers: Sit to/from Stand Sit to Stand: Min assist         General transfer comment: Min steadying assist during power-up.      Balance Overall balance assessment: Needs assistance Sitting-balance support: No upper extremity supported;Feet supported Sitting balance-Leahy Scale: Fair Sitting balance - Comments: slouched posture which required verbal cues for upright   Standing balance support: Bilateral upper extremity supported Standing balance-Leahy Scale: Poor Standing balance comment: reliant on RW                           ADL either performed or assessed with clinical judgement   ADL Overall ADL's : Needs assistance/impaired Eating/Feeding: Moderate assistance;Sitting Eating/Feeding Details (indicate cue type and reason): Supportive assistance for coordinating and lifting arm to bring cup to face. Grooming: Moderate assistance;Sitting   Upper Body Bathing: Maximal assistance;Sitting   Lower Body Bathing: Maximal assistance;Sit to/from stand   Upper Body Dressing : Maximal assistance;Sitting   Lower Body Dressing: Maximal assistance;Sit to/from stand   Toilet Transfer: Minimal assistance;RW;Ambulation Statistician Details (indicate cue type and reason): Has the strength in triceps and grasp in order to utilize RW. Toileting- Clothing Manipulation and Hygiene: Minimal assistance;Sit to/from stand       Functional mobility during ADLs: Minimal assistance;Rolling walker General ADL Comments: Pt required increased cues to coordinate use of RW. Max assist for dressing/bathing ADL due to limited active and purposeful movement of B LE with L more impacted than R.     Vision Patient Visual Report: No change from baseline Additional Comments: Pt closing eyes frequently during session and reports sleepiness. Will continue to assess.     Perception     Praxis Praxis Praxis tested?: Deficits Deficits: Initiation;Ideomotor;Organization;Limb apraxia;Motor Impersistence Praxis-Other Comments: Significant motor planning deficits greatly limiting functional use despite strength.     Pertinent Vitals/Pain Pain Assessment: Faces Pain  Score: 7  Faces Pain Scale: Hurts even more Pain Location: Neck/surgical site Pain Descriptors / Indicators: Constant;Sharp Pain Intervention(s): Limited activity within patient's tolerance;Monitored during session;Repositioned     Hand Dominance Right   Extremity/Trunk Assessment Upper Extremity Assessment Upper Extremity Assessment: RUE deficits/detail;LUE deficits/detail RUE Deficits / Details: Reports in tact sensation but heaviness. PROM WFL. Very limited deltoid strength (2/5) although R stronger than L. 4/5 strength for elbow flexion and extension as well as grasp. Decreased motor planning during feeding tasks and poor coordination. RUE Sensation: decreased light touch;decreased proprioception RUE Coordination: decreased fine motor;decreased gross motor LUE Deficits / Details: Slow and poorly coordinated movement with decreased motor planning abilities. Minimal anterior deltoid strength 2-/5. 4-/5 strength for elbow flexion/extension and grasp. Reports numbness and noted decreased sensation for light touch. LUE Sensation: decreased light touch;decreased proprioception LUE Coordination: decreased fine motor;decreased gross motor   Lower Extremity Assessment Lower Extremity Assessment: Defer to PT evaluation   Cervical / Trunk Assessment Cervical / Trunk Assessment: Kyphotic   Communication Communication Communication: No difficulties   Cognition Arousal/Alertness: Lethargic Behavior During Therapy: Flat affect Overall Cognitive Status: No family/caregiver present to determine baseline cognitive functioning                                 General Comments: Flat affect throughout and lethargic. Able to follow commands and problem solve but slowly.   General Comments  Pt states he is unable to use either UE, however, pt with equal and strong bilateral grip strength. Pt able to use RW and lift RUE. Pt uses R UE  to help move to edge of chair.    Exercises     Shoulder Instructions      Home Living Family/patient expects to be discharged to:: Private residence Living Arrangements: Parent;Other relatives (mother and brother) Available Help at Discharge: Family Type of Home: House Home Access: Stairs to enter Secretary/administrator of Steps: 1 Entrance Stairs-Rails: None Home Layout: One level     Bathroom Shower/Tub: Chief Strategy Officer: Standard Bathroom Accessibility: Yes   Home Equipment: Shower seat          Prior Functioning/Environment Level of Independence: Independent        Comments: Working full time as a Engineer, materials and reports that despite previous stroke, was not limited with L UE strength and functional use prior to surgery.        OT Problem List: Decreased strength;Decreased range of motion;Decreased activity tolerance;Impaired balance (sitting and/or standing);Decreased safety awareness;Decreased knowledge of use of DME or AE;Decreased knowledge of precautions;Pain;Impaired UE functional use;Decreased coordination      OT Treatment/Interventions: Self-care/ADL training;Therapeutic exercise;Therapeutic activities;Patient/family education;Balance training;Energy conservation;Neuromuscular education;DME and/or AE instruction    OT Goals(Current goals can be found in the care plan section) Acute Rehab OT Goals Patient Stated Goal: be able to use my arms so I can go home OT Goal Formulation: With patient/family Time For Goal Achievement: 02/09/17 Potential to Achieve Goals: Good ADL Goals Pt Will Perform Grooming: sitting;with supervision Pt Will Perform Upper Body Dressing: with supervision;sitting Pt Will Perform Lower Body Dressing: with supervision;sit to/from stand Pt Will Transfer to Toilet: with supervision;bedside commode;ambulating (BSC over toilet) Pt Will Perform Toileting - Clothing Manipulation and hygiene: with supervision;sit  to/from stand Pt/caregiver will Perform Home Exercise Program: Increased ROM;Increased strength;Both right and left upper extremity;With written HEP provided;Independently Additional ADL Goal #1: Pt will independently recall and  adhere to cervical precautions during morning ADL routine.  OT Frequency: Min 3X/week   Barriers to D/C:            Co-evaluation              End of Session Equipment Utilized During Treatment: Gait belt;Rolling walker Nurse Communication: Mobility status  Activity Tolerance: Patient tolerated treatment well Patient left: in bed;with call bell/phone within reach;with family/visitor present  OT Visit Diagnosis: Other abnormalities of gait and mobility (R26.89);Muscle weakness (generalized) (M62.81);Pain Pain - part of body:  (neck)                Time: 9326-7124 OT Time Calculation (min): 37 min Charges:  OT General Charges $OT Visit: 1 Procedure OT Evaluation $OT Eval Moderate Complexity: 1 Procedure OT Treatments $Self Care/Home Management : 8-22 mins G-Codes:     Doristine Section, MS OTR/L  Pager: [phone]   Kayliegh Boyers A Petula Rotolo 01/26/2017, 10:57 AM

## 2017-01-26 NOTE — Progress Notes (Signed)
qPhysical Therapy Treatment Patient Details Name: Russell Carter MRN: [id number] DOB: August 24, 1956 Today's Date: 01/26/2017    History of Present Illness 61 y.o. male S/P C4-C7 ACDF and C3-T1 laminectomy with PMH including spondylosis, radiculopathy, MI, stroke, HTN, DM, arthritis, ETOH, drug abuse, L RCR, migraine, and renal insufficiency.    PT Comments    Pt progressing well toward goals and tolerated stairs today. Pt continues to be limited by impaired coordination, strength and balance. Pt with poor foot clearance on R during navigation of hallway with ambulation required physical assist to prevent falls. Pt also significantly limited by UE apraxia requiring assist for feeding. PTA pt was mod-I and is highly motivated to return to PLOF. Recommend d/c to CIR to address strength, balance, coordination and activity tolerance for safe transition home. PT will continue to follow.   Follow Up Recommendations  Supervision/Assistance - 24 hour;CIR     Equipment Recommendations  Rolling walker with 5" wheels    Recommendations for Other Services       Precautions / Restrictions Precautions Precautions: Cervical;Fall Precaution Comments: Pt unable to recall precautions at beginning of session. Pt reducated on precautions and able to recall and end of session Required Braces or Orthoses: Cervical Brace Cervical Brace: Hard collar Restrictions Weight Bearing Restrictions: No    Mobility  Bed Mobility               General bed mobility comments: seated in chair upon PT arrival  Transfers Overall transfer level: Needs assistance Equipment used: Rolling walker (2 wheeled) Transfers: Sit to/from Stand Sit to Stand: Min assist         General transfer comment: pt required verbal cues to scoot to edge of chair prior to initiating standing. pt required verbal cues for hand placement and safety with RW. Pt able to power up from chair without physical assist, however, required  increased time/effort.   Ambulation/Gait Ambulation/Gait assistance: Min assist;Min guard Ambulation Distance (Feet): 160 Feet Assistive device: Rolling walker (2 wheeled) Gait Pattern/deviations: Step-through pattern;Decreased stride length;Trunk flexed;Narrow base of support Gait velocity: decreased Gait velocity interpretation: Below normal speed for age/gender General Gait Details: Pt required verbal cues for upright posture with ambulation. Min guard for safety, however, required minA to steady during turns due to R foot drag. Pt unaware of dragging R LE during turns.    Stairs Stairs: Yes   Stair Management: No rails;Step to pattern;Forwards Number of Stairs: 2 General stair comments: min guard for safety. verbal cues for technique of up with good, down with bad  Wheelchair Mobility    Modified Rankin (Stroke Patients Only)       Balance Overall balance assessment: Needs assistance Sitting-balance support: No upper extremity supported;Feet supported Sitting balance-Leahy Scale: Fair Sitting balance - Comments: slouched posture which required verbal cues for upright   Standing balance support: Bilateral upper extremity supported Standing balance-Leahy Scale: Poor Standing balance comment: reliant on RW                            Cognition Arousal/Alertness: Awake/alert Behavior During Therapy: Flat affect Overall Cognitive Status: No family/caregiver present to determine baseline cognitive functioning                                 General Comments: Pt followed commands well, however, very flat affect and no family present to determine if this is baseline  Exercises      General Comments General comments (skin integrity, edema, etc.): Pt states he is unable to use either UE, however, pt with equal and strong bilateral grip strength. Pt able to use RW and lift RUE. Pt uses R UE to help move to edge of chair.      Pertinent  Vitals/Pain Pain Assessment: 0-10 Pain Score: 7  Pain Location: Neck/surgical site Pain Descriptors / Indicators: Constant;Sharp Pain Intervention(s): Limited activity within patient's tolerance;Monitored during session    Home Living                      Prior Function            PT Goals (current goals can now be found in the care plan section) Acute Rehab PT Goals Patient Stated Goal: be able to use my arms so I can go home PT Goal Formulation: With patient Time For Goal Achievement: 02/08/17 Potential to Achieve Goals: Fair Progress towards PT goals: Progressing toward goals    Frequency    Min 5X/week      PT Plan Discharge plan needs to be updated    Co-evaluation             End of Session Equipment Utilized During Treatment: Gait belt;Cervical collar Activity Tolerance: Patient tolerated treatment well Patient left: in chair;with call bell/phone within reach Nurse Communication: Mobility status PT Visit Diagnosis: Unsteadiness on feet (R26.81);Difficulty in walking, not elsewhere classified (R26.2)     Time: 0960-4540 PT Time Calculation (min) (ACUTE ONLY): 23 min  Charges:  $Gait Training: 23-37 mins                    G Codes:       Russell Carter, SPT Acute Rehab SPT [phone]     Russell Carter 01/26/2017, 9:39 AM

## 2017-01-26 NOTE — Progress Notes (Signed)
Pt seen and examined. No issues overnight.  EXAM: Temp:  [97.4 F (36.3 C)-99.1 F (37.3 C)] 97.4 F (36.3 C) (04/07 0505) Pulse Rate:  [77-93] 93 (04/07 0505) Resp:  [18-20] 20 (04/07 0505) BP: (111-148)/(63-71) 148/71 (04/07 0505) SpO2:  [93 %-98 %] 98 % (04/07 0505) Intake/Output      04/06 0701 - 04/07 0700 04/07 0701 - 04/08 0700   P.O. 280    I.V. (mL/kg)     Blood     Total Intake(mL/kg) 280 (3.1)    Urine (mL/kg/hr) 350 (0.2)    Drains     Blood     Total Output 350     Net -70          Urine Occurrence 2 x     Awake and alert Follows commands throughout Right deltoid strength improving, left still very weak Otherwise full strength Incisions look good  Stable Continue current care Hopefully he will be accepted for rehab

## 2017-01-26 NOTE — Progress Notes (Signed)
Rehab Admissions Coordinator Note:  Patient was screened by Trish Mage for appropriateness for an Inpatient Acute Rehab Consult.  At this time, an inpatient rehab consult has been ordered and is pending.  We will follow up once consult is completed.  Lelon Frohlich M 01/26/2017, 10:02 AM  I can be reached at [phone].

## 2017-01-27 ENCOUNTER — Inpatient Hospital Stay (HOSPITAL_COMMUNITY): Payer: Medicaid Other

## 2017-01-27 DIAGNOSIS — M4722 Other spondylosis with radiculopathy, cervical region: Principal | ICD-10-CM

## 2017-01-27 DIAGNOSIS — Z419 Encounter for procedure for purposes other than remedying health state, unspecified: Secondary | ICD-10-CM

## 2017-01-27 DIAGNOSIS — D62 Acute posthemorrhagic anemia: Secondary | ICD-10-CM

## 2017-01-27 DIAGNOSIS — D696 Thrombocytopenia, unspecified: Secondary | ICD-10-CM

## 2017-01-27 DIAGNOSIS — F32A Depression, unspecified: Secondary | ICD-10-CM

## 2017-01-27 DIAGNOSIS — G8918 Other acute postprocedural pain: Secondary | ICD-10-CM

## 2017-01-27 DIAGNOSIS — E1165 Type 2 diabetes mellitus with hyperglycemia: Secondary | ICD-10-CM

## 2017-01-27 DIAGNOSIS — I2510 Atherosclerotic heart disease of native coronary artery without angina pectoris: Secondary | ICD-10-CM

## 2017-01-27 DIAGNOSIS — R7303 Prediabetes: Secondary | ICD-10-CM

## 2017-01-27 DIAGNOSIS — I10 Essential (primary) hypertension: Secondary | ICD-10-CM

## 2017-01-27 DIAGNOSIS — N183 Chronic kidney disease, stage 3 (moderate): Secondary | ICD-10-CM

## 2017-01-27 DIAGNOSIS — Z8673 Personal history of transient ischemic attack (TIA), and cerebral infarction without residual deficits: Secondary | ICD-10-CM

## 2017-01-27 DIAGNOSIS — I252 Old myocardial infarction: Secondary | ICD-10-CM

## 2017-01-27 DIAGNOSIS — D72829 Elevated white blood cell count, unspecified: Secondary | ICD-10-CM

## 2017-01-27 DIAGNOSIS — F1410 Cocaine abuse, uncomplicated: Secondary | ICD-10-CM

## 2017-01-27 DIAGNOSIS — R7309 Other abnormal glucose: Secondary | ICD-10-CM

## 2017-01-27 DIAGNOSIS — M47812 Spondylosis without myelopathy or radiculopathy, cervical region: Secondary | ICD-10-CM

## 2017-01-27 DIAGNOSIS — F329 Major depressive disorder, single episode, unspecified: Secondary | ICD-10-CM

## 2017-01-27 LAB — GLUCOSE, CAPILLARY: GLUCOSE-CAPILLARY: 108 mg/dL — AB (ref 65–99)

## 2017-01-27 MED ORDER — DEXAMETHASONE SODIUM PHOSPHATE 4 MG/ML IJ SOLN
4.0000 mg | Freq: Three times a day (TID) | INTRAMUSCULAR | Status: DC
  Administered 2017-01-27 – 2017-01-28 (×5): 4 mg via INTRAVENOUS
  Filled 2017-01-27 (×5): qty 1

## 2017-01-27 MED ORDER — METHOCARBAMOL 750 MG PO TABS: 750.0000 mg | ORAL_TABLET | Freq: Four times a day (QID) | ORAL | Status: DC | PRN

## 2017-01-27 MED ORDER — GABAPENTIN 300 MG PO CAPS
600.0000 mg | ORAL_CAPSULE | Freq: Three times a day (TID) | ORAL | Status: DC
  Administered 2017-01-27 – 2017-01-28 (×3): 600 mg via ORAL
  Filled 2017-01-27 (×3): qty 2

## 2017-01-27 MED ORDER — ACETAMINOPHEN 500 MG PO TABS: 1000.0000 mg | ORAL_TABLET | Freq: Four times a day (QID) | ORAL | Status: DC | PRN

## 2017-01-27 MED ORDER — HYDROCODONE-ACETAMINOPHEN 7.5-325 MG PO TABS: 1.0000 | ORAL_TABLET | Freq: Four times a day (QID) | ORAL | Status: DC | PRN

## 2017-01-27 NOTE — Progress Notes (Signed)
Some dorsal neck pain overnight, managed with oral medication Neurological function unchanged Incisions look good Labs stable Continue therapy Hopeful he will be accepted for CIR

## 2017-01-27 NOTE — Consult Note (Signed)
Physical Medicine and Rehabilitation Consult Reason for Consult: Weakness Referring Physician: Dr. Bevely Palmer   HPI: Russell Carter is a 61 y.o. male with pmh of arthritis, CAD status post MI, cervical stenosis, cocaine abuse, depression, gout, diabetes, HTN, CKD, CVA presented on 4/5 due to cervical spondylosis with radiculopathy. He had associated severe pain in neck and shoulder. He underwent C4-7 anterior discectomy with fusion, posterior cervical fusion C3-C6 and laminectomy C3-T1. Hospital course complicated by postop pain, leukocytosis, thrombocytopenia, ABLA.  Review of Systems  Gastrointestinal: Positive for constipation.  Musculoskeletal: Positive for back pain, joint pain, myalgias and neck pain.  Neurological: Positive for tingling, sensory change, speech change and focal weakness.  All other systems reviewed and are negative.  Past Medical History:  Diagnosis Date  . Anginal pain (HCC)   . Arthritis   . Blood in stool   . CAD (coronary artery disease)    a. 2011: cath showing mod non-obstructive disease b. 02/2016: cath with 95% stenosis in the Mid RCA. DES placed.  . Cervical stenosis of spine   . Cocaine abuse    history  . Depression   . Diabetes mellitus without complication (HCC)   . Gout, unspecified   . Headache   . HTN (hypertension)   . Hypercholesterolemia   . Hyperglycemia   . Migraines   . Myocardial infarction 2008   drug cocaine and marijuana at time of mi none  since    . Pneumonia   . Renal insufficiency   . Stroke [facility])    Past Surgical History:  Procedure Laterality Date  . ANTERIOR CERVICAL DECOMP/DISCECTOMY FUSION N/A 01/24/2017   Procedure: Cervical Four-Five,Cervical Five Six,Cervical Six-Seven Anterior cervical discectomy with fusion and plate with Cervical Three-Thoracic- One Laminectomy/Foraminotomy with Cervical Three-Thoracic- One Fixation and fusion;  Surgeon: Loura Halt Ditty, MD;  Location: [facility] OR;  Service: Neurosurgery;   Laterality: N/A;  Part-1 anterior approach  . CARDIAC CATHETERIZATION  2008; ~ 2011   [place]-40%lad  . CARDIAC CATHETERIZATION N/A 03/16/2016   Procedure: Left Heart Cath and Coronary Angiography;  Surgeon: Marykay Lex, MD;  Location: [facility] INVASIVE CV LAB;  Service: Cardiovascular;  Laterality: N/A;  . CORONARY ANGIOPLASTY     STENTS  . EYE SURGERY Bilateral    "for double vision"  . INGUINAL HERNIA REPAIR  05/12/2012   Procedure: HERNIA REPAIR INGUINAL ADULT;  Surgeon: Liz Malady, MD;  Location: [facility];  Service: General;  Laterality: Right;  . INGUINAL HERNIA REPAIR Left 02/02/2003   Dr. Violeta Gelinas. repair with mesh. Same procedure done again in  05/22/2004   . INGUINAL HERNIA REPAIR Left ?date 2nd OR  . POSTERIOR CERVICAL FUSION/FORAMINOTOMY N/A 01/24/2017   Procedure: Cervical Three-Thoracic One-Laminectomy/Foraminotomy with Cervical Three-Thoracic One Fixation and fusion;  Surgeon: Loura Halt Ditty, MD;  Location: [facility] OR;  Service: Neurosurgery;  Laterality: N/A;  Part-2 Posterior approach  . RECONSTRUCT / STABILIZE DISTAL ULNA    . SHOULDER OPEN ROTATOR CUFF REPAIR Left 2010  . SHOULDER SURGERY Left    "injured on the job; had to cut small bone out"  . THUMB FUSION Right    Family History  Problem Relation Age of Onset  . Heart attack Mother     x7. Still alive  . Heart attack Father     deceased b/c of MI  . Heart attack Brother     older, deceased b/c of MI    Social History:  reports that he has never  smoked. He has never used smokeless tobacco. He reports that he drinks about 13.8 oz of alcohol per week . He reports that he uses drugs, including "Crack" cocaine, Marijuana, and Cocaine. Allergies:  Allergies  Allergen Reactions  . Shellfish Allergy Anaphylaxis   Medications Prior to Admission  Medication Sig Dispense Refill  . allopurinol (ZYLOPRIM) 300 MG tablet Take 1 tablet (300 mg total) by mouth daily. 30 tablet 6  . amLODipine  (NORVASC) 5 MG tablet Take 1 tablet (5 mg total) by mouth daily. 30 tablet 11  . aspirin EC 81 MG EC tablet Take 1 tablet (81 mg total) by mouth daily.    Marland Kitchen CALCIUM-VITAMIN D PO Take 1 tablet by mouth daily.    . clotrimazole (LOTRIMIN) 1 % cream Apply 1 application topically daily as needed (athlete's foot).    . cyclobenzaprine (FLEXERIL) 10 MG tablet TAKE ONE TABLET BY MOUTH THREE TIMES DAILY AS NEEDED FOR MUSCLE SPASMS 30 tablet 0  . diphenhydrAMINE (BENADRYL) 25 MG tablet Take 25 mg by mouth every 6 (six) hours as needed for itching.    . gabapentin (NEURONTIN) 300 MG capsule Take 300 mg by mouth 3 (three) times daily.    . hydrochlorothiazide (HYDRODIURIL) 25 MG tablet Take 1 tablet (25 mg total) by mouth daily. 30 tablet 11  . HYDROcodone-acetaminophen (NORCO/VICODIN) 5-325 MG tablet Take 1-2 tablets by mouth every 4 (four) hours as needed for moderate pain or severe pain. 30 tablet 0  . losartan (COZAAR) 100 MG tablet Take 1 tablet (100 mg total) by mouth daily. 90 tablet 3  . meclizine (ANTIVERT) 25 MG tablet Take 1 tablet (25 mg total) by mouth 3 (three) times daily as needed. For vertigo 30 tablet 6  . Multiple Vitamin (MULTIVITAMIN WITH MINERALS) TABS tablet Take 1 tablet by mouth daily.    Bertram Gala Glycol-Propyl Glycol (SYSTANE OP) Apply 1 drop to eye 3 (three) times daily.    . rosuvastatin (CRESTOR) 40 MG tablet Take 1 tablet (40 mg total) by mouth daily. 30 tablet 11  . ticagrelor (BRILINTA) 90 MG TABS tablet Take 1 tablet (90 mg total) by mouth 2 (two) times daily. 60 tablet 11  . nitroGLYCERIN (NITROSTAT) 0.4 MG SL tablet Place 1 tablet (0.4 mg total) under the tongue every 5 (five) minutes as needed for chest pain. 25 tablet 4    Home: Home Living Family/patient expects to be discharged to:: Private residence Living Arrangements: Parent, Other relatives (mother and brother) Available Help at Discharge: Family Type of Home: House Home Access: Stairs to enter Water quality scientist of Steps: 1 Entrance Stairs-Rails: None Home Layout: One level Bathroom Shower/Tub: Engineer, manufacturing systems: Standard Bathroom Accessibility: Yes Home Equipment: Banker History: Prior Function Level of Independence: Independent Comments: Working full time as a Engineer, materials and reports that despite previous stroke, was not limited with L UE strength and functional use prior to surgery. Functional Status:  Mobility: Bed Mobility Overal bed mobility: Needs Assistance Bed Mobility: Sit to Sidelying, Sidelying to Sit, Rolling Rolling: Min assist Sidelying to sit: Min assist Sit to sidelying: Min assist General bed mobility comments: Min assist to raise trunk and initiate movements. Transfers Overall transfer level: Needs assistance Equipment used: Rolling walker (2 wheeled) Transfers: Sit to/from Stand Sit to Stand: Min assist General transfer comment: Min steadying assist during power-up.  Ambulation/Gait Ambulation/Gait assistance: Min assist, Min guard Ambulation Distance (Feet): 160 Feet Assistive device: Rolling walker (2 wheeled) Gait Pattern/deviations: Step-through pattern, Decreased  stride length, Trunk flexed, Narrow base of support General Gait Details: Pt required verbal cues for upright posture with ambulation. Min guard for safety, however, required minA to steady during turns due to R foot drag. Pt unaware of dragging R LE during turns.  Gait velocity: decreased Gait velocity interpretation: Below normal speed for age/gender Stairs: Yes Stairs assistance: Min guard Stair Management: No rails, Step to pattern, Forwards Number of Stairs: 2 General stair comments: min guard for safety. verbal cues for technique of up with good, down with bad    ADL: ADL Overall ADL's : Needs assistance/impaired Eating/Feeding: Moderate assistance, Sitting Eating/Feeding Details (indicate cue type and reason): Supportive assistance for  coordinating and lifting arm to bring cup to face. Grooming: Moderate assistance, Sitting Upper Body Bathing: Maximal assistance, Sitting Lower Body Bathing: Maximal assistance, Sit to/from stand Upper Body Dressing : Maximal assistance, Sitting Lower Body Dressing: Maximal assistance, Sit to/from stand Toilet Transfer: Minimal assistance, RW, Ambulation Toilet Transfer Details (indicate cue type and reason): Has the strength in triceps and grasp in order to utilize RW. Toileting- Clothing Manipulation and Hygiene: Minimal assistance, Sit to/from stand Functional mobility during ADLs: Minimal assistance, Rolling walker General ADL Comments: Pt required increased cues to coordinate use of RW. Max assist for dressing/bathing ADL due to limited active and purposeful movement of B LE with L more impacted than R.  Cognition: Cognition Overall Cognitive Status: No family/caregiver present to determine baseline cognitive functioning Orientation Level: Oriented X4 Cognition Arousal/Alertness: Lethargic Behavior During Therapy: Flat affect Overall Cognitive Status: No family/caregiver present to determine baseline cognitive functioning General Comments: Flat affect throughout and lethargic. Able to follow commands and problem solve but slowly.  Blood pressure 103/65, pulse 89, temperature 98.7 F (37.1 C), temperature source Oral, resp. rate 16, height 5\' 11"  (1.803 m), weight 89.8 kg (197 lb 15.6 oz), SpO2 91 %. Physical Exam  Vitals reviewed. Constitutional: He appears well-developed and well-nourished.  HENT:  Head: Normocephalic and atraumatic.  Eyes: EOM are normal. Right eye exhibits no discharge. Left eye exhibits no discharge.  Neck:  C-collar  Cardiovascular: Normal rate and regular rhythm.   Respiratory: Effort normal and breath sounds normal.  GI: He exhibits distension. There is no tenderness.  Musculoskeletal: He exhibits no edema or tenderness.  Neurological: He is alert.    Sensation diminished to light touch in shoulders, hands, feet Hoarse voice Motor: B/l LE: 4/5  Proximal to distal B/l UE: Shoulder abduction 2/5, elbow flexion/extension 2+/5, hand 4/5 with ataxia  Skin:  Incision with dressing c/d/i  Psychiatric: He has a normal mood and affect. His behavior is normal.    Results for orders placed or performed during the hospital encounter of 01/24/17 (from the past 24 hour(s))  CBC     Status: Abnormal   Collection Time: 01/26/17  9:28 AM  Result Value Ref Range   WBC 13.6 (H) 4.0 - 10.5 K/uL   RBC 3.32 (L) 4.22 - 5.81 MIL/uL   Hemoglobin 9.6 (L) 13.0 - 17.0 g/dL   HCT 95.2 (L) 84.1 - 32.4 %   MCV 88.6 78.0 - 100.0 fL   MCH 28.9 26.0 - 34.0 pg   MCHC 32.7 30.0 - 36.0 g/dL   RDW 40.1 02.7 - 25.3 %   Platelets 146 (L) 150 - 400 K/uL  Basic metabolic panel     Status: Abnormal   Collection Time: 01/26/17  9:28 AM  Result Value Ref Range   Sodium 135 135 - 145 mmol/L  Potassium 4.0 3.5 - 5.1 mmol/L   Chloride 100 (L) 101 - 111 mmol/L   CO2 25 22 - 32 mmol/L   Glucose, Bld 117 (H) 65 - 99 mg/dL   BUN 19 6 - 20 mg/dL   Creatinine, Ser 1.61 (H) 0.61 - 1.24 mg/dL   Calcium 8.0 (L) 8.9 - 10.3 mg/dL   GFR calc non Af Amer 50 (L) >60 mL/min   GFR calc Af Amer 58 (L) >60 mL/min   Anion gap 10 5 - 15  Glucose, capillary     Status: None   Collection Time: 01/26/17 11:12 AM  Result Value Ref Range   Glucose-Capillary 86 65 - 99 mg/dL  Glucose, capillary     Status: None   Collection Time: 01/26/17  4:08 PM  Result Value Ref Range   Glucose-Capillary 90 65 - 99 mg/dL  Glucose, capillary     Status: Abnormal   Collection Time: 01/26/17 10:00 PM  Result Value Ref Range   Glucose-Capillary 101 (H) 65 - 99 mg/dL   Comment 1 Notify RN    Comment 2 Document in Chart   Glucose, capillary     Status: Abnormal   Collection Time: 01/27/17  7:11 AM  Result Value Ref Range   Glucose-Capillary 108 (H) 65 - 99 mg/dL   Comment 1 Notify RN    Comment 2  Document in Chart    No results found.  Assessment/Plan: Diagnosis: Cervical spondylosis with radiculopathy Labs and images independently reviewed.  Records reviewed and summated above.  1. Does the need for close, 24 hr/day medical supervision in concert with the patient's rehab needs make it unreasonable for this patient to be served in a less intensive setting? Yes 2. Co-Morbidities requiring supervision/potential complications: postop pain (Biofeedback training with therapies to help reduce reliance on opiate pain medications, monitor pain control during therapies, and sedation at rest and titrate to maximum efficacy to ensure participation and gains in therapies), leukocytosis (cont to monitor for signs and symptoms of infection, further workup if indicated), Thrombocytopenia (< 60,000/mm3 no resistive exercise), ABLA (transfuse if necessary to ensure appropriate perfusion for increased activity tolerance), arthritis, CAD status post MI (cont meds), cocaine abuse (counsel), depression (ensure mood does not hinder progress of therapies), Prediabetes (Monitor in accordance with exercise and adjust meds as necessary), HTN (monitor and provide prns in accordance with increased physical exertion and pain), CKD (avoid nephrotoxic meds), CVA 3. Due to bladder management, bowel management, safety, skin/wound care, disease management, pain management and patient education, does the patient require 24 hr/day rehab nursing? Yes 4. Does the patient require coordinated care of a physician, rehab nurse, PT (1-2 hrs/day, 5 days/week), OT (1-2 hrs/day, 5 days/week) and SLP (1-2 hrs/day, 5 days/week) to address physical and functional deficits in the context of the above medical diagnosis(es)? Yes Addressing deficits in the following areas: balance, endurance, locomotion, strength, transferring, bowel/bladder control, bathing, dressing, feeding, toileting, speech, swallowing and psychosocial support 5. Can the  patient actively participate in an intensive therapy program of at least 3 hrs of therapy per day at least 5 days per week? Potentially 6. The potential for patient to make measurable gains while on inpatient rehab is excellent 7. Anticipated functional outcomes upon discharge from inpatient rehab are supervision and min assist  with PT, supervision and min assist with OT, independent and modified independent with SLP. 8. Estimated rehab length of stay to reach the above functional goals is: 16-19 days. 9. Does the patient have adequate  social supports and living environment to accommodate these discharge functional goals? Yes 10. Anticipated D/C setting: Home 11. Anticipated post D/C treatments: HH therapy and Home excercise program 12. Overall Rehab/Functional Prognosis: good  RECOMMENDATIONS: This patient's condition is appropriate for continued rehabilitative care in the following setting: CIR when demonstrates ability to tolerate 3 hours therapy/day. Patient has agreed to participate in recommended program. Yes Note that insurance prior authorization may be required for reimbursement for recommended care.  Comment: Rehab Admissions Coordinator to follow up.  Maryla Morrow, MD, [place] [facility] 01/27/2017

## 2017-01-27 NOTE — Progress Notes (Signed)
Occupational Therapy Treatment Patient Details Name: Russell Carter MRN: [id number] DOB: Dec 21, 1955 Today's Date: 01/27/2017    History of present illness 61 y.o. male S/P C4-C7 ACDF and C3-T1 laminectomy with PMH including spondylosis, radiculopathy, MI, stroke, HTN, DM, arthritis, ETOH, drug abuse, L RCR, migraine, and renal insufficiency.   OT comments  Pt. Declined eob/oob today due to head pain. Able to return demo of a/prom exercises for b ues.  Remains a good CIR candidate for continued therapies.    Follow Up Recommendations  CIR    Equipment Recommendations       Recommendations for Other Services      Precautions / Restrictions Precautions Precautions: Cervical;Fall Required Braces or Orthoses: Cervical Brace Cervical Brace: Hard collar       Mobility Bed Mobility                  Transfers                 General transfer comment: declined eob/oob today    Balance                                           ADL either performed or assessed with clinical judgement   ADL Overall ADL's : Needs assistance/impaired Eating/Feeding: Moderate assistance;Bed level Eating/Feeding Details (indicate cue type and reason): Supportive assistance for coordinating and lifting arm to bring bacon to mouth.                                   General ADL Comments: declined oob     Vision       Perception     Praxis      Cognition Arousal/Alertness: Awake/alert Behavior During Therapy: Impulsive Overall Cognitive Status: No family/caregiver present to determine baseline cognitive functioning                                 General Comments: inerupting and changing what he wanted to do multiple time throughout session        Exercises Other Exercises Other Exercises: provided assisted demonstration of A/PROM to B UEs. within pts. tolerance.  L UE sig. weaker than Rue.  but was able to provide "push/pull"    Shoulder Instructions       General Comments      Pertinent Vitals/ Pain       Pain Assessment:  (did not rate but c/o pain on back of head) Pain Location: back of his head Pain Descriptors / Indicators: Constant Pain Intervention(s): Limited activity within patient's tolerance;Repositioned  Home Living                                          Prior Functioning/Environment              Frequency  Min 3X/week        Progress Toward Goals  OT Goals(current goals can now be found in the care plan section)  Progress towards OT goals: Progressing toward goals     Plan Discharge plan remains appropriate    Co-evaluation  End of Session    OT Visit Diagnosis: Other abnormalities of gait and mobility (R26.89);Muscle weakness (generalized) (M62.81);Pain   Activity Tolerance Patient tolerated treatment well   Patient Left in bed;with call bell/phone within reach;with bed alarm set   Nurse Communication          Time: 2620-3559 OT Time Calculation (min): 13 min  Charges: OT General Charges $OT Visit: 1 Procedure OT Treatments $Therapeutic Exercise: 8-22 mins   Robet Leu, COTA/L 01/27/2017, 10:05 AM

## 2017-01-27 NOTE — Progress Notes (Signed)
qPhysical Therapy Treatment Patient Details Name: Russell Carter MRN: [id number] DOB: [birth year] Today's Date: 01/27/2017    History of Present Illness 61 y.o. male S/P C4-C7 ACDF and C3-T1 laminectomy with PMH including spondylosis, radiculopathy, MI, stroke, HTN, DM, arthritis, ETOH, drug abuse, L RCR, migraine, and renal insufficiency.    PT Comments    Pt limited by pain today. Pt tolerated exercises EOB and ambulation on RA, however, sats down to 86% pt put on 2L O2 with sats recovering to 90% within 2 minutes. Pt R UE uncoordinated and limited by proximal weakness and is limited by residual L UE hemiplegia. Pt with poor weight shift to L and decreased foot clearance on R that improves with facilitation. Pt requires multimodal cues throughout ambulation for progression of RLE. Current d/c recommendation remains appropriate to address above listed impairments and maximize return of function. PT will continue to follow.    Follow Up Recommendations  Supervision/Assistance - 24 hour;CIR     Equipment Recommendations  Rolling walker with 5" wheels    Recommendations for Other Services       Precautions / Restrictions Precautions Precautions: Cervical;Fall Required Braces or Orthoses: Cervical Brace Cervical Brace: Hard collar Restrictions Weight Bearing Restrictions: No    Mobility  Bed Mobility Overal bed mobility: Needs Assistance Bed Mobility: Rolling;Sidelying to Sit Rolling: Min assist Sidelying to sit: Min assist;+2 for physical assistance       General bed mobility comments: verbal cues to bend knee and reach for rail to roll onto side. verbal cues to bring LE to EOB. minA x2 to elevate trunk from surface. minA to steady upon sitting upright. complaints of dizziniess in sitting with BP of 173/68  Transfers Overall transfer level: Needs assistance Equipment used: Rolling walker (2 wheeled) Transfers: Sit to/from Stand Sit to Stand: Min assist         General  transfer comment: verbal cues for forward flexion. minA to power up from surface. verbal cues for upright posture and safety wtih RW  Ambulation/Gait Ambulation/Gait assistance: +2 safety/equipment;Mod assist Ambulation Distance (Feet): 60 Feet Assistive device: Rolling walker (2 wheeled) Gait Pattern/deviations: Step-through pattern;Decreased stride length;Trunk flexed;Narrow base of support;Decreased weight shift to left;Decreased step length - right Gait velocity: decreased Gait velocity interpretation: Below normal speed for age/gender General Gait Details: pt required verbal cues for upright posture and RW management. Pt required physical assist for weight shift to the L for increased step length on R. Pt required verbal cues for moving RLE as pt would advance RW and LLE and leave behind RLE. Pt required chair follow, however was able to amb without use of chair. Pt with 1 standing rest break   Stairs            Wheelchair Mobility    Modified Rankin (Stroke Patients Only)       Balance Overall balance assessment: Needs assistance Sitting-balance support: Feet supported;No upper extremity supported;Single extremity supported Sitting balance-Leahy Scale: Poor Sitting balance - Comments: slouched posture required verbal cues    Standing balance support: Bilateral upper extremity supported Standing balance-Leahy Scale: Poor Standing balance comment: reliant on RW                            Cognition Arousal/Alertness: Awake/alert Behavior During Therapy: WFL for tasks assessed/performed Overall Cognitive Status: Impaired/Different from baseline Area of Impairment: Memory;Following commands;Safety/judgement;Problem solving  Memory: Decreased short-term memory;Decreased recall of precautions Following Commands: Follows one step commands consistently;Follows one step commands with increased time Safety/Judgement: Decreased awareness  of safety   Problem Solving: Slow processing;Difficulty sequencing;Requires verbal cues;Requires tactile cues General Comments: indecisive throughout session. pt attempted to sit up without using log roll technique and required multimodal cues for compliance with precautions.       Exercises General Exercises - Lower Extremity Long Arc Quad: Strengthening;Both;5 reps;Seated Hip Flexion/Marching: AROM;Strengthening;10 reps;Seated    General Comments General comments (skin integrity, edema, etc.): Pt with uncoordinated movements in B UE proximal weakness. Pt with reports of pain in L cervical spine with active and passive movement of LUE. Joint mobility in B shoulders appear to be [facility].       Pertinent Vitals/Pain Pain Assessment: Faces Faces Pain Scale: Hurts even more Pain Location: neck Pain Descriptors / Indicators: Constant;Grimacing;Moaning Pain Intervention(s): Limited activity within patient's tolerance;Monitored during session;Repositioned    Home Living                      Prior Function            PT Goals (current goals can now be found in the care plan section) Acute Rehab PT Goals Patient Stated Goal: Get better Progress towards PT goals: Progressing toward goals    Frequency    Min 5X/week      PT Plan Current plan remains appropriate    Co-evaluation             End of Session Equipment Utilized During Treatment: Gait belt;Cervical collar;Oxygen (1L O2) Activity Tolerance: Patient tolerated treatment well Patient left: with nursing/sitter in room;Other (comment) (in bathroom with nurse present for attempted BM) Nurse Communication: Mobility status PT Visit Diagnosis: Unsteadiness on feet (R26.81);Difficulty in walking, not elsewhere classified (R26.2);Pain;Muscle weakness (generalized) (M62.81) Pain - part of body:  (neck)     Time: 4599-7741 PT Time Calculation (min) (ACUTE ONLY): 32 min  Charges:  $Gait Training: 8-22  mins $Therapeutic Exercise: 8-22 mins                    G Codes:       [employer], SPT Acute Rehab SPT [phone]     Lane Hacker 01/27/2017, 2:25 PM

## 2017-01-28 ENCOUNTER — Inpatient Hospital Stay (HOSPITAL_COMMUNITY)
Admission: RE | Admit: 2017-01-28 | Discharge: 2017-02-12 | DRG: 552 | Disposition: A | Discharge status: Home / Self Care | Payer: Medicaid Other | Source: Intra-hospital | Attending: Physical Medicine & Rehabilitation | Admitting: Physical Medicine & Rehabilitation

## 2017-01-28 ENCOUNTER — Encounter (HOSPITAL_COMMUNITY): Payer: Self-pay

## 2017-01-28 DIAGNOSIS — M62838 Other muscle spasm: Secondary | ICD-10-CM | POA: Diagnosis not present

## 2017-01-28 DIAGNOSIS — F329 Major depressive disorder, single episode, unspecified: Secondary | ICD-10-CM | POA: Diagnosis present

## 2017-01-28 DIAGNOSIS — Z8249 Family history of ischemic heart disease and other diseases of the circulatory system: Secondary | ICD-10-CM

## 2017-01-28 DIAGNOSIS — E119 Type 2 diabetes mellitus without complications: Secondary | ICD-10-CM

## 2017-01-28 DIAGNOSIS — Z79899 Other long term (current) drug therapy: Secondary | ICD-10-CM | POA: Diagnosis not present

## 2017-01-28 DIAGNOSIS — Z981 Arthrodesis status: Secondary | ICD-10-CM | POA: Diagnosis not present

## 2017-01-28 DIAGNOSIS — Z91013 Allergy to seafood: Secondary | ICD-10-CM | POA: Diagnosis not present

## 2017-01-28 DIAGNOSIS — G8918 Other acute postprocedural pain: Secondary | ICD-10-CM | POA: Diagnosis present

## 2017-01-28 DIAGNOSIS — M4722 Other spondylosis with radiculopathy, cervical region: Principal | ICD-10-CM | POA: Diagnosis present

## 2017-01-28 DIAGNOSIS — I129 Hypertensive chronic kidney disease with stage 1 through stage 4 chronic kidney disease, or unspecified chronic kidney disease: Secondary | ICD-10-CM | POA: Diagnosis present

## 2017-01-28 DIAGNOSIS — D62 Acute posthemorrhagic anemia: Secondary | ICD-10-CM | POA: Diagnosis present

## 2017-01-28 DIAGNOSIS — M109 Gout, unspecified: Secondary | ICD-10-CM | POA: Diagnosis present

## 2017-01-28 DIAGNOSIS — E1122 Type 2 diabetes mellitus with diabetic chronic kidney disease: Secondary | ICD-10-CM | POA: Diagnosis present

## 2017-01-28 DIAGNOSIS — D696 Thrombocytopenia, unspecified: Secondary | ICD-10-CM | POA: Diagnosis present

## 2017-01-28 DIAGNOSIS — F419 Anxiety disorder, unspecified: Secondary | ICD-10-CM | POA: Diagnosis present

## 2017-01-28 DIAGNOSIS — D649 Anemia, unspecified: Secondary | ICD-10-CM

## 2017-01-28 DIAGNOSIS — N183 Chronic kidney disease, stage 3 (moderate): Secondary | ICD-10-CM | POA: Diagnosis present

## 2017-01-28 DIAGNOSIS — Z8673 Personal history of transient ischemic attack (TIA), and cerebral infarction without residual deficits: Secondary | ICD-10-CM

## 2017-01-28 DIAGNOSIS — I10 Essential (primary) hypertension: Secondary | ICD-10-CM | POA: Diagnosis present

## 2017-01-28 DIAGNOSIS — Z7982 Long term (current) use of aspirin: Secondary | ICD-10-CM

## 2017-01-28 DIAGNOSIS — N189 Chronic kidney disease, unspecified: Secondary | ICD-10-CM | POA: Diagnosis not present

## 2017-01-28 DIAGNOSIS — D72829 Elevated white blood cell count, unspecified: Secondary | ICD-10-CM | POA: Diagnosis not present

## 2017-01-28 DIAGNOSIS — I2510 Atherosclerotic heart disease of native coronary artery without angina pectoris: Secondary | ICD-10-CM | POA: Diagnosis present

## 2017-01-28 DIAGNOSIS — Z7902 Long term (current) use of antithrombotics/antiplatelets: Secondary | ICD-10-CM

## 2017-01-28 DIAGNOSIS — E1165 Type 2 diabetes mellitus with hyperglycemia: Secondary | ICD-10-CM | POA: Diagnosis not present

## 2017-01-28 DIAGNOSIS — M5412 Radiculopathy, cervical region: Secondary | ICD-10-CM | POA: Diagnosis present

## 2017-01-28 DIAGNOSIS — I252 Old myocardial infarction: Secondary | ICD-10-CM

## 2017-01-28 DIAGNOSIS — M7989 Other specified soft tissue disorders: Secondary | ICD-10-CM | POA: Diagnosis not present

## 2017-01-28 DIAGNOSIS — Z955 Presence of coronary angioplasty implant and graft: Secondary | ICD-10-CM | POA: Diagnosis not present

## 2017-01-28 DIAGNOSIS — R5381 Other malaise: Secondary | ICD-10-CM

## 2017-01-28 DIAGNOSIS — M5410 Radiculopathy, site unspecified: Secondary | ICD-10-CM

## 2017-01-28 LAB — BPAM RBC
BLOOD PRODUCT EXPIRATION DATE: 201805012359
BLOOD PRODUCT EXPIRATION DATE: 201805022359
Blood Product Expiration Date: 201805012359
Blood Product Expiration Date: 201805012359
ISSUE DATE / TIME: 201804051247
ISSUE DATE / TIME: 201804080741
ISSUE DATE / TIME: 201804081413
UNIT TYPE AND RH: 7300
UNIT TYPE AND RH: 7300
Unit Type and Rh: 7300
Unit Type and Rh: 7300

## 2017-01-28 LAB — TYPE AND SCREEN
ABO/RH(D): B POS
ANTIBODY SCREEN: NEGATIVE
UNIT DIVISION: 0
UNIT DIVISION: 0
Unit division: 0
Unit division: 0

## 2017-01-28 MED ORDER — OXYCODONE HCL 5 MG PO TABS
5.0000 mg | ORAL_TABLET | ORAL | Status: DC | PRN
  Administered 2017-01-28 – 2017-01-29 (×2): 10 mg via ORAL
  Administered 2017-01-31: 5 mg via ORAL
  Administered 2017-02-01 – 2017-02-05 (×14): 10 mg via ORAL
  Administered 2017-02-05: 5 mg via ORAL
  Administered 2017-02-06 – 2017-02-12 (×20): 10 mg via ORAL
  Filled 2017-01-28 (×11): qty 2
  Filled 2017-01-28: qty 1
  Filled 2017-01-28 (×9): qty 2
  Filled 2017-01-28: qty 1
  Filled 2017-01-28 (×18): qty 2

## 2017-01-28 MED ORDER — OXYCODONE HCL ER 15 MG PO T12A
30.0000 mg | EXTENDED_RELEASE_TABLET | Freq: Two times a day (BID) | ORAL | Status: DC
  Administered 2017-01-28 – 2017-02-12 (×30): 30 mg via ORAL
  Filled 2017-01-28 (×30): qty 2

## 2017-01-28 MED ORDER — ONDANSETRON HCL 4 MG/2ML IJ SOLN: 4.0000 mg | Freq: Four times a day (QID) | INTRAMUSCULAR | Status: DC | PRN

## 2017-01-28 MED ORDER — GABAPENTIN 300 MG PO CAPS: 300.0000 mg | ORAL_CAPSULE | Freq: Three times a day (TID) | ORAL | 2 refills | Status: DC

## 2017-01-28 MED ORDER — ACETAMINOPHEN 325 MG PO TABS
325.0000 mg | ORAL_TABLET | ORAL | Status: DC | PRN
  Administered 2017-01-31 – 2017-02-03 (×2): 650 mg via ORAL
  Filled 2017-01-28: qty 2

## 2017-01-28 MED ORDER — METHOCARBAMOL 750 MG PO TABS: 750.0000 mg | ORAL_TABLET | Freq: Four times a day (QID) | ORAL | 2 refills | Status: DC

## 2017-01-28 MED ORDER — DEXAMETHASONE 4 MG PO TABS
4.0000 mg | ORAL_TABLET | Freq: Three times a day (TID) | ORAL | Status: DC
  Administered 2017-01-28 – 2017-01-31 (×9): 4 mg via ORAL
  Filled 2017-01-28 (×9): qty 1

## 2017-01-28 MED ORDER — BISACODYL 10 MG RE SUPP
10.0000 mg | Freq: Every day | RECTAL | Status: DC | PRN
  Filled 2017-01-28: qty 1

## 2017-01-28 MED ORDER — SENNA 8.6 MG PO TABS
1.0000 | ORAL_TABLET | Freq: Two times a day (BID) | ORAL | Status: DC
  Administered 2017-01-28 – 2017-02-12 (×27): 8.6 mg via ORAL
  Filled 2017-01-28 (×30): qty 1

## 2017-01-28 MED ORDER — GABAPENTIN 300 MG PO CAPS
600.0000 mg | ORAL_CAPSULE | Freq: Three times a day (TID) | ORAL | Status: DC
  Administered 2017-01-28 – 2017-02-04 (×21): 600 mg via ORAL
  Filled 2017-01-28 (×20): qty 2

## 2017-01-28 MED ORDER — ZOLPIDEM TARTRATE 5 MG PO TABS
5.0000 mg | ORAL_TABLET | Freq: Every evening | ORAL | Status: DC | PRN
  Administered 2017-01-30 – 2017-02-11 (×10): 5 mg via ORAL
  Filled 2017-01-28 (×11): qty 1

## 2017-01-28 MED ORDER — SORBITOL 70 % SOLN
30.0000 mL | Freq: Every day | Status: DC | PRN
  Administered 2017-01-30: 30 mL via ORAL
  Filled 2017-01-28: qty 30

## 2017-01-28 MED ORDER — HYDROCODONE-ACETAMINOPHEN 7.5-325 MG PO TABS: 1.0000 | ORAL_TABLET | ORAL | 0 refills | Status: DC | PRN

## 2017-01-28 MED ORDER — TICAGRELOR 90 MG PO TABS: 90.0000 mg | ORAL_TABLET | Freq: Two times a day (BID) | ORAL | 11 refills | Status: DC

## 2017-01-28 MED ORDER — PANTOPRAZOLE SODIUM 40 MG PO TBEC
40.0000 mg | DELAYED_RELEASE_TABLET | Freq: Every day | ORAL | Status: DC
  Administered 2017-01-28 – 2017-02-11 (×15): 40 mg via ORAL
  Filled 2017-01-28 (×15): qty 1

## 2017-01-28 MED ORDER — ONDANSETRON HCL 4 MG PO TABS: 4.0000 mg | ORAL_TABLET | Freq: Four times a day (QID) | ORAL | Status: DC | PRN

## 2017-01-28 MED ORDER — DOCUSATE SODIUM 100 MG PO CAPS
100.0000 mg | ORAL_CAPSULE | Freq: Two times a day (BID) | ORAL | Status: DC
  Administered 2017-01-28 – 2017-02-12 (×27): 100 mg via ORAL
  Filled 2017-01-28 (×30): qty 1

## 2017-01-28 MED ORDER — METHOCARBAMOL 750 MG PO TABS
750.0000 mg | ORAL_TABLET | Freq: Four times a day (QID) | ORAL | Status: DC | PRN
  Administered 2017-01-29 – 2017-02-09 (×13): 750 mg via ORAL
  Filled 2017-01-28 (×13): qty 1

## 2017-01-28 NOTE — Progress Notes (Signed)
I met with pt at bedside to discuss an inpt rehab admission. Pt is in agreement. I contacted Dr. Cyndy Freeze in surgery by phone. He will prepare pt for d/c. With pt's permission,. I contacted his son and pt's son is in agreement to [place] admission RN CM is aware. I will make the arrangements. [phone]

## 2017-01-28 NOTE — Progress Notes (Signed)
No acute events Shoulder strength improving but still fairly weak Optimistic about long term prognosis D/c to CIR today.

## 2017-01-28 NOTE — Progress Notes (Signed)
qPhysical Therapy Treatment Patient Details Name: Russell Carter MRN: [id number] DOB: 27-Feb-1956 Today's Date: 01/28/2017    History of Present Illness 61 y.o. male S/P C4-C7 ACDF and C3-T1 laminectomy with PMH including spondylosis, radiculopathy, MI, stroke, HTN, DM, arthritis, ETOH, drug abuse, L RCR, migraine, and renal insufficiency.    PT Comments    Pt is progressing toward goals.  He still has major weakness and numbness in his UE's (L>R), which limits his functional abilities/ADL's.  This session, pt needed assistance to wipe his face, wash his hands, and required nurse tech assistance to eat breakfast.  His pain reportedly reached 10/10 at the end of this session, but was stated as 3/10 during ambulation.  The current plan remains appropriate, as he will benefit from multidisciplinary rehab to maximize independence for return home.  Follow Up Recommendations  CIR;Supervision/Assistance - 24 hour     Equipment Recommendations  Rolling walker with 5" wheels;3in1 (PT)    Recommendations for Other Services Rehab consult     Precautions / Restrictions Precautions Precautions: Cervical;Fall Precaution Comments: 3/3 precautions recalled at beginning of session. Required Braces or Orthoses: Cervical Brace Cervical Brace: Hard collar Restrictions Weight Bearing Restrictions: No    Mobility  Bed Mobility Overal bed mobility: Needs Assistance Bed Mobility: Rolling;Sidelying to Sit Rolling: Min assist Sidelying to sit: Min guard       General bed mobility comments: Hand over hand assist required to reach L hand to rail for rolling.  VC's required for log roll sequence.  Transfers Overall transfer level: Needs assistance Equipment used: Rolling walker (2 wheeled) Transfers: Sit to/from Stand Sit to Stand: Min assist         General transfer comment: Min physical assist for power up.  Extra time and VC's required for hand placement and precaution  maintenance.  Ambulation/Gait Ambulation/Gait assistance: Min guard;+2 safety/equipment Ambulation Distance (Feet): 200 Feet Assistive device: Rolling walker (2 wheeled) Gait Pattern/deviations: Decreased stride length;Shuffle;Drifts right/left;Narrow base of support;Decreased dorsiflexion - right Gait velocity: decreased Gait velocity interpretation: Below normal speed for age/gender General Gait Details: Bilateral decreased heel strike and bilateral pidgeon toes. Pt states he has "always been pidgeon toed, knock-kneed and bowlegged."   VC's required for dorsiflexion and heel strikes.  Pt drifted R but was able to correct and get centered without cueing.  Chair follow for safety but not utilized.  Two 30 second standing breaks needed.  O2 sat at rest: 97%, during ambulation: 96-97% at standing break.   Stairs            Wheelchair Mobility    Modified Rankin (Stroke Patients Only)       Balance Overall balance assessment: Needs assistance Sitting-balance support: No upper extremity supported;Feet supported Sitting balance-Leahy Scale: Fair     Standing balance support: Bilateral upper extremity supported Standing balance-Leahy Scale: Poor Standing balance comment: reliant on RW                            Cognition Arousal/Alertness: Awake/alert Behavior During Therapy: WFL for tasks assessed/performed;Flat affect Overall Cognitive Status: Impaired/Different from baseline Area of Impairment: Safety/judgement;Problem solving                       Following Commands: Follows one step commands consistently;Follows one step commands with increased time Safety/Judgement: Decreased awareness of safety   Problem Solving: Slow processing;Difficulty sequencing;Requires verbal cues        Exercises  General Comments General comments (skin integrity, edema, etc.): L UE still showing significant weakness and numbness.  Pt states that numbness in R UE  is getting worse but getting better in L UE.  Bilateral grip strength sufficient for walker use but pt consistently unable to lift L arm and uses R arm to assist.  At end of gait, pt requested to use bathroom.  Upon sitting on commode, condom cath became loose and urine leaked onto floor.  Pt spent 15 minutes on commode attempting a BM but did not.      Pertinent Vitals/Pain Pain Assessment: 0-10 Pain Score: 10-Worst pain ever (8/10 at beginning, 3/10 duing ambulation, 10/10 end) Pain Location: neck Pain Descriptors / Indicators: Constant;Operative site guarding;Headache Pain Intervention(s): Limited activity within patient's tolerance;Monitored during session;Repositioned    Home Living                      Prior Function            PT Goals (current goals can now be found in the care plan section) Acute Rehab PT Goals Patient Stated Goal: Get better PT Goal Formulation: With patient Time For Goal Achievement: 02/11/17 Potential to Achieve Goals: Fair Progress towards PT goals: Progressing toward goals    Frequency    Min 5X/week      PT Plan Current plan remains appropriate    Co-evaluation             End of Session Equipment Utilized During Treatment: Gait belt;Back brace Activity Tolerance: Patient limited by pain Patient left: in chair;with chair alarm set;with call bell/phone within reach Nurse Communication: Mobility status PT Visit Diagnosis: Unsteadiness on feet (R26.81);Muscle weakness (generalized) (M62.81);Hemiplegia and hemiparesis;Other abnormalities of gait and mobility (R26.89) Hemiplegia - Right/Left: Left Hemiplegia - dominant/non-dominant: Non-dominant Hemiplegia - caused by:  (radiculopathy) Pain - part of body:  (neck)     Time: 6047-9987 PT Time Calculation (min) (ACUTE ONLY): 52 min  Charges:                       G Codes:       Willaim Rayas SPT   Willaim Rayas 01/28/2017, 9:44 AM

## 2017-01-28 NOTE — Care Management Note (Signed)
Case Management Note  Patient Details  Name: Russell Carter MRN: [id number] Date of Birth: 1956/05/13  Subjective/Objective:                    Action/Plan: Pt discharging to CIR today. No further needs per CM.   Expected Discharge Date:  01/28/17               Expected Discharge Plan:  Home/Self Care  In-House Referral:     Discharge planning Services  CM Consult  Post Acute Care Choice:  Durable Medical Equipment Choice offered to:     DME Arranged:  3-N-1, Walker rolling DME Agency:  [facility]  HH Arranged:    [facility] Agency:     Status of Service:  Completed, signed off  If discussed at Long Length of Stay Meetings, dates discussed:    Additional Comments:  Kermit Balo, RN 01/28/2017, 1:13 PM

## 2017-01-28 NOTE — Discharge Summary (Signed)
Date of Admission: 01/24/2017  Date of Discharge: 01/28/17  PRE-OPERATIVE DIAGNOSIS:  Cervical spondylosis with radiculopathy; severe foraminal stenosis bilaterally C3-T1  POST-OPERATIVE DIAGNOSIS:  Same  PROCEDURE:  C4-7 anterior discectomy with fusion and plate fixation; posterior cervical fusion with bilateral lateral mass screws C3-6 and bilateral pedicle screws C7 and T1; laminectomy C3-T1 for decompression with bilateral foraminotomies C3-T1; use of BMP  Attending: Loura Halt Ditty, MD  Hospital Course:  The patient was admitted for the above listed operation.  He had bilateral C5 palsy, L>R, post-operatively.  It is slowly improving.  He did not have any medical complications post-op.  He is discharged to CIR in stable condition.  Follow up: 3 weeks  Allergies as of 01/28/2017      Reactions   Shellfish Allergy Anaphylaxis      Medication List    STOP taking these medications   cyclobenzaprine 10 MG tablet Commonly known as:  FLEXERIL     TAKE these medications   allopurinol 300 MG tablet Commonly known as:  ZYLOPRIM Take 1 tablet (300 mg total) by mouth daily.   amLODipine 5 MG tablet Commonly known as:  NORVASC Take 1 tablet (5 mg total) by mouth daily.   aspirin 81 MG EC tablet Take 1 tablet (81 mg total) by mouth daily.   CALCIUM-VITAMIN D PO Take 1 tablet by mouth daily.   clotrimazole 1 % cream Commonly known as:  LOTRIMIN Apply 1 application topically daily as needed (athlete's foot).   diphenhydrAMINE 25 MG tablet Commonly known as:  BENADRYL Take 25 mg by mouth every 6 (six) hours as needed for itching.   gabapentin 300 MG capsule Commonly known as:  NEURONTIN Take 1 capsule (300 mg total) by mouth 3 (three) times daily.   hydrochlorothiazide 25 MG tablet Commonly known as:  HYDRODIURIL Take 1 tablet (25 mg total) by mouth daily.   HYDROcodone-acetaminophen 5-325 MG tablet Commonly known as:  NORCO/VICODIN Take 1-2 tablets by mouth every  4 (four) hours as needed for moderate pain or severe pain. What changed:  Another medication with the same name was added. Make sure you understand how and when to take each.   HYDROcodone-acetaminophen 7.5-325 MG tablet Commonly known as:  NORCO Take 1-2 tablets by mouth every 4 (four) hours as needed for moderate pain. What changed:  You were already taking a medication with the same name, and this prescription was added. Make sure you understand how and when to take each.   losartan 100 MG tablet Commonly known as:  COZAAR Take 1 tablet (100 mg total) by mouth daily.   meclizine 25 MG tablet Commonly known as:  ANTIVERT Take 1 tablet (25 mg total) by mouth 3 (three) times daily as needed. For vertigo   methocarbamol 750 MG tablet Commonly known as:  ROBAXIN Take 1 tablet (750 mg total) by mouth 4 (four) times daily.   multivitamin with minerals Tabs tablet Take 1 tablet by mouth daily.   nitroGLYCERIN 0.4 MG SL tablet Commonly known as:  NITROSTAT Place 1 tablet (0.4 mg total) under the tongue every 5 (five) minutes as needed for chest pain.   rosuvastatin 40 MG tablet Commonly known as:  CRESTOR Take 1 tablet (40 mg total) by mouth daily.   SYSTANE OP Apply 1 drop to eye 3 (three) times daily.   ticagrelor 90 MG Tabs tablet Commonly known as:  BRILINTA Take 1 tablet (90 mg total) by mouth 2 (two) times daily. Start taking on:  01/31/2017

## 2017-01-28 NOTE — Clinical Social Work Note (Signed)
CSW consulted for SNF backup to CIR. PT/OT rec CIR. CIR accepting and making arrangements for admission. RNCM aware. CSW signing off as no further SW needs identified. Please reconsult if new SW needs arise.   Corlis Hove, LCSWA, LCASA Clinical Social Work [phone]

## 2017-01-28 NOTE — H&P (Signed)
Physical Medicine and Rehabilitation Admission H&P    Chief complaint: Neck pain  HPI: Russell Carter is a 61 y.o. right handed  male with pmh of arthritis, CAD status post MI maintained on aspirin and Brilinta, cervical stenosis, cocaine and alcohol abuse, depression, gout, diabetes, HTN, CKD stage III, CVA with little residual weakness. Per chart review patient lives with mother and brother. Independent working as a Animal nutritionist. One level home with one-step entry. Presented on 4/5 due to cervical spondylosis with radiculopathy. He had associated severe pain in neck and shoulder. He underwent C4-7 anterior discectomy with fusion, posterior cervical fusion C3-C6 and laminectomy C3-T1 per Dr. Cyndy Freeze. Cervical collar at all times. Decadron protocol as advised. Hospital course complicated by postop pain with OxyContin sustained release added, leukocytosis 13,600, thrombocytopenia 146,000, ABLA 9.6 and monitored. Physical and occupational therapy evaluations completed with recommendations of physical medicine rehabilitation consult. Patient was admitted for a comprehensive rehabilitation program  Review of Systems  Constitutional: Negative for chills and fever.  HENT: Negative for hearing loss and tinnitus.   Eyes: Negative for blurred vision and double vision.  Respiratory: Negative for cough and shortness of breath.   Cardiovascular: Negative for chest pain, palpitations and leg swelling.  Gastrointestinal: Positive for constipation.  Musculoskeletal: Positive for back pain, joint pain and myalgias.  Skin: Negative for rash.  Neurological: Positive for tingling, sensory change, speech change, focal weakness and headaches. Negative for seizures.  Psychiatric/Behavioral: Positive for depression.  All other systems reviewed and are negative.  Past Medical History:  Diagnosis Date  . Anginal pain ([place])   . Arthritis   . Blood in stool   . CAD (coronary artery disease)    a. 2011: cath  showing mod non-obstructive disease b. 02/2016: cath with 95% stenosis in the Mid RCA. DES placed.  . Cervical stenosis of spine   . Cocaine abuse    history  . Depression   . Diabetes mellitus without complication ([place])   . Gout, unspecified   . Headache   . HTN (hypertension)   . Hypercholesterolemia   . Hyperglycemia   . Migraines   . Myocardial infarction 2008   drug cocaine and marijuana at time of mi none  since    . Pneumonia   . Renal insufficiency   . Stroke [facility])    Past Surgical History:  Procedure Laterality Date  . ANTERIOR CERVICAL DECOMP/DISCECTOMY FUSION N/A 01/24/2017   Procedure: Cervical Four-Five,Cervical Five Six,Cervical Six-Seven Anterior cervical discectomy with fusion and plate with Cervical Three-Thoracic- One Laminectomy/Foraminotomy with Cervical Three-Thoracic- One Fixation and fusion;  Surgeon: Kevan Ny Ditty, MD;  Location: [facility];  Service: Neurosurgery;  Laterality: N/A;  Part-1 anterior approach  . CARDIAC CATHETERIZATION  2008; ~ 2011   [place]-40%lad  . CARDIAC CATHETERIZATION N/A 03/16/2016   Procedure: Left Heart Cath and Coronary Angiography;  Surgeon: Leonie Man, MD;  Location: [facility];  Service: Cardiovascular;  Laterality: N/A;  . CORONARY ANGIOPLASTY     ST[place]  . EYE SURGERY Bilateral    "for double vision"  . INGUINAL HERNIA REPAIR  05/12/2012   Procedure: HERNIA REPAIR INGUINAL ADULT;  Surgeon: Zenovia Jarred, MD;  Location: [facility];  Service: General;  Laterality: Right;  . INGUINAL HERNIA REPAIR Left 02/02/2003   Dr. Georganna Skeans. repair with mesh. Same procedure done again in  05/22/2004   . INGUINAL HERNIA REPAIR Left ?date 2nd OR  . POSTERIOR CERVICAL FUSION/FORAMINOTOMY N/A 01/24/2017  Procedure: Cervical Three-Thoracic One-Laminectomy/Foraminotomy with Cervical Three-Thoracic One Fixation and fusion;  Surgeon: Kevan Ny Ditty, MD;  Location: [facility];  Service: Neurosurgery;  Laterality: N/A;   Part-2 Posterior approach  . RECONSTRUCT / STABILIZE DISTAL ULNA    . SHOULDER OPEN ROTATOR CUFF REPAIR Left 2010  . SHOULDER SURGERY Left    "injured on the job; had to cut small bone out"  . THUMB FUSION Right    Family History  Problem Relation Age of Onset  . Heart attack Mother     x7. Still alive  . Heart attack Father     deceased b/c of MI  . Heart attack Brother     older, deceased b/c of MI    Social History:  reports that he has never smoked. He has never used smokeless tobacco. He reports that he drinks about 13.8 oz of alcohol per week . He reports that he uses drugs, including "Crack" cocaine, Marijuana, and Cocaine. Allergies:  Allergies  Allergen Reactions  . Shellfish Allergy Anaphylaxis   Medications Prior to Admission  Medication Sig Dispense Refill  . allopurinol (ZYLOPRIM) 300 MG tablet Take 1 tablet (300 mg total) by mouth daily. 30 tablet 6  . amLODipine (NORVASC) 5 MG tablet Take 1 tablet (5 mg total) by mouth daily. 30 tablet 11  . aspirin EC 81 MG EC tablet Take 1 tablet (81 mg total) by mouth daily.    Marland Kitchen CALCIUM-VITAMIN D PO Take 1 tablet by mouth daily.    . clotrimazole (LOTRIMIN) 1 % cream Apply 1 application topically daily as needed (athlete's foot).    . cyclobenzaprine (FLEXERIL) 10 MG tablet TAKE ONE TABLET BY MOUTH THREE TIMES DAILY AS NEEDED FOR MUSCLE SPASMS 30 tablet 0  . diphenhydrAMINE (BENADRYL) 25 MG tablet Take 25 mg by mouth every 6 (six) hours as needed for itching.    . gabapentin (NEURONTIN) 300 MG capsule Take 300 mg by mouth 3 (three) times daily.    . hydrochlorothiazide (HYDRODIURIL) 25 MG tablet Take 1 tablet (25 mg total) by mouth daily. 30 tablet 11  . HYDROcodone-acetaminophen (NORCO/VICODIN) 5-325 MG tablet Take 1-2 tablets by mouth every 4 (four) hours as needed for moderate pain or severe pain. 30 tablet 0  . losartan (COZAAR) 100 MG tablet Take 1 tablet (100 mg total) by mouth daily. 90 tablet 3  . meclizine (ANTIVERT) 25  MG tablet Take 1 tablet (25 mg total) by mouth 3 (three) times daily as needed. For vertigo 30 tablet 6  . Multiple Vitamin (MULTIVITAMIN WITH MINERALS) TABS tablet Take 1 tablet by mouth daily.    Vladimir Faster Glycol-Propyl Glycol (SYSTANE OP) Apply 1 drop to eye 3 (three) times daily.    . rosuvastatin (CRESTOR) 40 MG tablet Take 1 tablet (40 mg total) by mouth daily. 30 tablet 11  . ticagrelor (BRILINTA) 90 MG TABS tablet Take 1 tablet (90 mg total) by mouth 2 (two) times daily. 60 tablet 11  . nitroGLYCERIN (NITROSTAT) 0.4 MG SL tablet Place 1 tablet (0.4 mg total) under the tongue every 5 (five) minutes as needed for chest pain. 25 tablet 4    Home: Home Living Family/patient expects to be discharged to:: Private residence Living Arrangements: Parent, Other relatives (mother and brother) Available Help at Discharge: Family Type of Home: House Home Access: Stairs to enter Technical brewer of Steps: 1 Entrance Stairs-Rails: None Home Layout: One level Bathroom Shower/Tub: Chiropodist: Standard Bathroom Accessibility: Yes Home Equipment: Civil engineer, contracting  Functional History: Prior Function Level of Independence: Independent Comments: Working full time as a Animal nutritionist and reports that despite previous stroke, was not limited with L UE strength and functional use prior to surgery.  Functional Status:  Mobility: Bed Mobility Overal bed mobility: Needs Assistance Bed Mobility: Rolling, Sidelying to Sit Rolling: Min assist Sidelying to sit: Min assist, +2 for physical assistance Sit to sidelying: Min assist General bed mobility comments: verbal cues to bend knee and reach for rail to roll onto side. verbal cues to bring LE to EOB. minA x2 to elevate trunk from surface. minA to steady upon sitting upright. complaints of dizziniess in sitting with BP of 173/68 Transfers Overall transfer level: Needs assistance Equipment used: Rolling walker (2  wheeled) Transfers: Sit to/from Stand Sit to Stand: Min assist General transfer comment: verbal cues for forward flexion. minA to power up from surface. verbal cues for upright posture and safety wtih RW Ambulation/Gait Ambulation/Gait assistance: +2 safety/equipment, Mod assist Ambulation Distance (Feet): 60 Feet Assistive device: Rolling walker (2 wheeled) Gait Pattern/deviations: Step-through pattern, Decreased stride length, Trunk flexed, Narrow base of support, Decreased weight shift to left, Decreased step length - right General Gait Details: pt required verbal cues for upright posture and RW management. Pt required physical assist for weight shift to the L for increased step length on R. Pt required verbal cues for moving RLE as pt would advance RW and LLE and leave behind RLE. Pt required chair follow, however was able to amb without use of chair. Pt with 1 standing rest break Gait velocity: decreased Gait velocity interpretation: Below normal speed for age/gender Stairs: Yes Stairs assistance: Min guard Stair Management: No rails, Step to pattern, Forwards Number of Stairs: 2 General stair comments: min guard for safety. verbal cues for technique of up with good, down with bad    ADL: ADL Overall ADL's : Needs assistance/impaired Eating/Feeding: Moderate assistance, Bed level Eating/Feeding Details (indicate cue type and reason): Supportive assistance for coordinating and lifting arm to bring bacon to mouth. Grooming: Moderate assistance, Sitting Upper Body Bathing: Maximal assistance, Sitting Lower Body Bathing: Maximal assistance, Sit to/from stand Upper Body Dressing : Maximal assistance, Sitting Lower Body Dressing: Maximal assistance, Sit to/from stand Toilet Transfer: Minimal assistance, RW, Ambulation Toilet Transfer Details (indicate cue type and reason): Has the strength in triceps and grasp in order to utilize RW. Toileting- Clothing Manipulation and Hygiene: Minimal  assistance, Sit to/from stand Functional mobility during ADLs: Minimal assistance, Rolling walker General ADL Comments: declined oob  Cognition: Cognition Overall Cognitive Status: Impaired/Different from baseline Orientation Level: Oriented X4 Cognition Arousal/Alertness: Awake/alert Behavior During Therapy: WFL for tasks assessed/performed Overall Cognitive Status: Impaired/Different from baseline Area of Impairment: Memory, Following commands, Safety/judgement, Problem solving Memory: Decreased short-term memory, Decreased recall of precautions Following Commands: Follows one step commands consistently, Follows one step commands with increased time Safety/Judgement: Decreased awareness of safety Problem Solving: Slow processing, Difficulty sequencing, Requires verbal cues, Requires tactile cues General Comments: indecisive throughout session. pt attempted to sit up without using log roll technique and required multimodal cues for compliance with precautions.   Physical Exam: Blood pressure 125/78, pulse 75, temperature 98.1 F (36.7 C), temperature source Oral, resp. rate 20, height _0  (1.803 m), weight 89.8 kg (197 lb 15.6 oz), SpO2 96 %. Physical Exam  Vitals reviewed. Constitutional: He appears well-developed and well-nourished.  HENT:  Head: Normocephalic and atraumatic.  Eyes: Conjunctivae and EOM are normal. Right eye exhibits no discharge. Left eye exhibits  no discharge.  Neck:  Cervical collar in place  Cardiovascular: Normal rate and regular rhythm.   Respiratory: Effort normal and breath sounds normal. No respiratory distress.  GI: Soft. Bowel sounds are normal. He exhibits no distension.  Musculoskeletal: He exhibits edema. He exhibits no tenderness.  Neurological: He is alert.  Sensation diminished to light touch in shoulders, hands, feet Hoarse voice (improving) Motor: B/l LE: 4-4+/5 proximal to distal B/l UE: Shoulder abduction 2/5, elbow flexion/extension  2+/5, hand 4/5 with ataxia (right slightly stronger than left)  Skin:  Incision c/d/i  Psychiatric: He has a normal mood and affect. His behavior is normal. Thought content normal.     Results for orders placed or performed during the hospital encounter of 01/24/17 (from the past 48 hour(s))  CBC     Status: Abnormal   Collection Time: 01/26/17  9:28 AM  Result Value Ref Range   WBC 13.6 (H) 4.0 - 10.5 K/uL   RBC 3.32 (L) 4.22 - 5.81 MIL/uL   Hemoglobin 9.6 (L) 13.0 - 17.0 g/dL   HCT 29.4 (L) 39.0 - 52.0 %   MCV 88.6 78.0 - 100.0 fL   MCH 28.9 26.0 - 34.0 pg   MCHC 32.7 30.0 - 36.0 g/dL   RDW 14.7 11.5 - 15.5 %   Platelets 146 (L) 150 - 400 K/uL  Basic metabolic panel     Status: Abnormal   Collection Time: 01/26/17  9:28 AM  Result Value Ref Range   Sodium 135 135 - 145 mmol/L   Potassium 4.0 3.5 - 5.1 mmol/L   Chloride 100 (L) 101 - 111 mmol/L   CO2 25 22 - 32 mmol/L   Glucose, Bld 117 (H) 65 - 99 mg/dL   BUN 19 6 - 20 mg/dL   Creatinine, Ser 1.46 (H) 0.61 - 1.24 mg/dL   Calcium 8.0 (L) 8.9 - 10.3 mg/dL   GFR calc non Af Amer 50 (L) >60 mL/min   GFR calc Af Amer 58 (L) >60 mL/min    Comment: (NOTE) The eGFR has been calculated using the CKD EPI equation. This calculation has not been validated in all clinical situations. eGFR's persistently <60 mL/min signify possible Chronic Kidney Disease.    Anion gap 10 5 - 15  Glucose, capillary     Status: None   Collection Time: 01/26/17 11:12 AM  Result Value Ref Range   Glucose-Capillary 86 65 - 99 mg/dL  Glucose, capillary     Status: None   Collection Time: 01/26/17  4:08 PM  Result Value Ref Range   Glucose-Capillary 90 65 - 99 mg/dL  Glucose, capillary     Status: Abnormal   Collection Time: 01/26/17 10:00 PM  Result Value Ref Range   Glucose-Capillary 101 (H) 65 - 99 mg/dL   Comment 1 Notify RN    Comment 2 Document in Chart   Glucose, capillary     Status: Abnormal   Collection Time: 01/27/17  7:11 AM  Result  Value Ref Range   Glucose-Capillary 108 (H) 65 - 99 mg/dL   Comment 1 Notify RN    Comment 2 Document in Chart    Mr Cervical Spine Wo Contrast  Result Date: 01/27/2017 CLINICAL DATA:  Initial evaluation for cervical spondylosis with radiculopathy. Status post decompression with fusion. EXAM: MRI CERVICAL SPINE WITHOUT CONTRAST TECHNIQUE: Multiplanar, multisequence MR imaging of the cervical spine was performed. No intravenous contrast was administered. COMPARISON:  Prior MRI from 03/13/2016. FINDINGS: Alignment: Straightening of the normal cervical  lordosis. No listhesis. Vertebrae: Vertebral body heights maintained. Signal intensity within the vertebral body bone marrow is normal. Postoperative changes from recent spinal fusion with decompression are evident. Patient has undergone ACDF at through C7. Posterior decompression with laminectomies have been performed at C3 through T1. Posterior fusion in place at C3 through T1 as well. Hardware appears well positioned. Cord: Signal intensity within the cervical spinal cord is normal. Posterior Fossa, vertebral arteries, paraspinal tissues: Visualized brain and posterior fossa within normal limits. Craniocervical junction normal. Large T2 hyperintense collection at the posterior surgical measures 14.9 x 5.1 x 2.3 cm in greatest transaxial dimensions. Additional fluid partially visualized subjacent to the inferior aspect of the left trapezius muscle. Finding most likely reflects a postoperative seroma. While possible CSF leak not entirely excluded, no sagging of the brain at the level of the foramen magnum seen to suggest intracranial hypotension. Extensive prevertebral edema not with fluid with present as well related to anterior cervical ACDF. Probable surgical drain in place. Normal intravascular flow voids present within the vertebral arteries bilaterally. Disc levels: C2-C3: Mild diffuse chronic degenerative disc osteophyte complex mildly indents the ventral  thecal sac. Mild facet arthrosis. No significant canal or foraminal stenosis. C3-C4: Patient is status post posterior decompression. Stable mild diffuse disc bulge. Moderate to severe canal stenosis related to the postoperative collection. Mild to moderate residual foraminal narrowing, slightly worse on the left, which appears improved from previous. C4-C5: Status post ACDF and posterior decompression. Moderate to severe canal stenosis related to the postoperative collection. No appreciable residual foraminal stenosis. C5-C6: Status post ACDF and posterior decompression. Moderate to severe canal stenosis related to the posterior collection. No appreciable residual foraminal narrowing. C6-C7: Status post ACDF and posterior decompression. Moderate to severe canal stenosis related to the posterior collection. No appreciable residual foraminal stenosis. C7-T1: Status post posterior decompression with fusion. Stable degenerative disc bulge. No significant canal stenosis. Probable improved bilateral foraminal narrowing. Visualized upper thoracic spine within normal limits. IMPRESSION: 1. Postoperative changes from recent ACDF at C4 through C7, with posterior decompression and fusion at C3 through T1. 2. 14.9 x 5.1 x 2.3 cm collection at the posterior surgical site, most likely a postoperative seroma. While CSF leak is not entirely excluded, no sagging of the brain at the craniocervical junction to suggest CNS hypotension identified. As always, superimposed infection could be considered in the correct clinical setting. 3. Moderate to severe diffuse canal stenosis at C3-4 through C7-T1, largely due to the postoperative collection. Foraminal narrowing at these levels appears improved relative to preoperative exam from 03/13/2016. Electronically Signed   By: Jeannine Boga M.D.   On: 01/27/2017 22:47       Medical Problem List and Plan: 1.  Debilitation with neck and shoulder pain secondary to cervical  spondylosis with radiculopathy. Status post C4-7 anterior discectomy and fusion, posterior cervical fusion C3-C6. Cervical collar all times 2.  DVT Prophylaxis/Anticoagulation: SCDs. Check vascular study 3. Pain Management: OxyContin sustained release 30 mg every 12 hours, Neurontin 600 mg 3 times a day, Oxycodone, Robaxin as needed 4. Mood: Provide emotional support 5. Neuropsych: This patient is capable of making decisions on his own behalf. 6. Skin/Wound Care: Routine skin checks 7. Fluids/Electrolytes/Nutrition: Routine I&O's with follow-up chemistries 8. Acute blood loss anemia. Follow-up CBC 9. CAD with history of MI. Patient on aspirin and Brilinta prior to admission. 10. Diet-controlled diabetes mellitus.. Diabetic teaching. Hemoglobin A1c 6.2 11. CKD stage III. Creatinine baseline 1.43-1.54, follow-up chemistries 12. History of CVA without residual.  Discuss resuming aspirin.  Post Admission Physician Evaluation: 1. Preadmission assessment reviewed and changes made below. 2. Functional deficits secondary  to cervical spondylosis with radiculopathy. 3. Patient is admitted to receive collaborative, interdisciplinary care between the physiatrist, rehab nursing staff, and therapy team. 4. Patient's level of medical complexity and substantial therapy needs in context of that medical necessity cannot be provided at a lesser intensity of care such as a SNF. 5. Patient has experienced substantial functional loss from his/her baseline which was documented above under the "Functional History" and "Functional Status" headings.  Judging by the patient's diagnosis, physical exam, and functional history, the patient has potential for functional progress which will result in measurable gains while on inpatient rehab.  These gains will be of substantial and practical use upon discharge  in facilitating mobility and self-care at the household level. 6. Physiatrist will provide 24 hour management of medical  needs as well as oversight of the therapy plan/treatment and provide guidance as appropriate regarding the interaction of the two. 7. The Preadmission Screening has been reviewed and patient status is unchanged unless otherwise stated above. 8. 24 hour rehab nursing will assist with bladder management, bowel management, safety, skin/wound care, disease management, medication administration, pain management and patient education  and help integrate therapy concepts, techniques,education, etc. 9. PT will assess and treat for/with: Lower extremity strength, range of motion, stamina, balance, functional mobility, safety, adaptive techniques and equipment, woundcare, coping skills, pain control, education.   Goals are: Mod I. 10. OT will assess and treat for/with: ADL's, functional mobility, safety, upper extremity strength, adaptive techniques and equipment, wound mgt, ego support, and community reintegration.   Goals are: Mod I/supervision. Therapy may not proceed with showering this patient. 11. SLP will assess and treat for/with: speech.  Goals are: Mod I/Ind. 12. Case Management and Social Worker will assess and treat for psychological issues and discharge planning. 75. Team conference will be held weekly to assess progress toward goals and to determine barriers to discharge. 14. Patient will receive at least 3 hours of therapy per day at least 5 days per week. 15. ELOS: 5-9 days.       16. Prognosis:  excellent and good  Delice Lesch, MD, Mellody Drown Lauraine Rinne J., PA-C 01/28/2017

## 2017-01-28 NOTE — PMR Pre-admission (Signed)
PMR Admission Coordinator Pre-Admission Assessment  Patient: Russell Carter is an 61 y.o., male MRN: [id number] DOB: Mar 06, 1956 Height: 5\' 11"  (180.3 cm) Weight: 89.8 kg (197 lb 15.6 oz)              Insurance Information HMO:     PPO:      PCP:      IPA:      80/20:      OTHER:  PRIMARY: Medicaid [place] Access      Policy#: [id number]      Subscriber: py  Medicaid Application Date:       Case Manager:  Disability Application Date:       Case Worker:  Pt states he has applied for disability in the past and that he has been denied  Emergency Contact Information Contact Information    Name Relation Home Work Mobile   Russell Carter Son   [phone]     Current Medical History  Patient Admitting Diagnosis: cervical spondylosis with radiculopathy  History of Present Illness: HPI: Russell Minoris a 61 y.o. right handed malewith pmh of arthritis, CAD status post MI maintained on aspirin and Brilinta, cervical stenosis, cocaine and alcohol abuse, depression, gout, diabetes, HTN, CKD stage III, CVA with little residual weakness. Presented on 4/5 due to cervical spondylosis with radiculopathy. He had associated severe pain in neck and shoulder. He underwent C4-7 anterior discectomy with fusion, posterior cervical fusion C3-C6 and laminectomy C3-T1 per Dr. Bevely Palmer. Cervical collar at all times. Decadron protocol as advised. Hospital course complicatedby postop pain with OxyContin sustained release added, leukocytosis 13,600, thrombocytopenia 146,000, ABLA 9.6 and monitored.     Past Medical History  Past Medical History:  Diagnosis Date  . Anginal pain (HCC)   . Arthritis   . Blood in stool   . CAD (coronary artery disease)    a. 2011: cath showing mod non-obstructive disease b. 02/2016: cath with 95% stenosis in the Mid RCA. DES placed.  . Cervical stenosis of spine   . Cocaine abuse    history  . Depression   . Diabetes mellitus without complication (HCC)   . Gout, unspecified    . Headache   . HTN (hypertension)   . Hypercholesterolemia   . Hyperglycemia   . Migraines   . Myocardial infarction 2008   drug cocaine and marijuana at time of mi none  since    . Pneumonia   . Renal insufficiency   . Stroke [facility])     Family History  family history includes Heart attack in his brother, father, and mother.  Prior Rehab/Hospitalizations:  Has the patient had major surgery during 100 days prior to admission? No  Current Medications   Current Facility-Administered Medications:  .  0.9 %  sodium chloride infusion, , Intravenous, Continuous, Vincent J Costella, PA-C .  0.9 %  sodium chloride infusion, 250 mL, Intravenous, Continuous, Vincent J Costella, PA-C .  0.9 %  sodium chloride infusion, , Intravenous, Once, Loura Halt Ditty, MD .  acetaminophen (TYLENOL) tablet 1,000 mg, 1,000 mg, Oral, Q6H PRN, Darci Current Costella, PA-C .  alum & mag hydroxide-simeth (MAALOX/MYLANTA) 200-200-20 MG/5ML suspension 30 mL, 30 mL, Oral, Q6H PRN, Darci Current Costella, PA-C .  bisacodyl (DULCOLAX) suppository 10 mg, 10 mg, Rectal, Daily PRN, Vincent J Costella, PA-C .  dexamethasone (DECADRON) injection 4 mg, 4 mg, Intravenous, Q8H, Loura Halt Ditty, MD, 4 mg at 01/28/17 [phone] .  diazepam (VALIUM) tablet 5 mg, 5 mg, Oral, Q6H PRN, Darci Current  Costella, PA-C, 5 mg at 01/25/17 0337 .  docusate sodium (COLACE) capsule 100 mg, 100 mg, Oral, BID, Vincent J Costella, PA-C, 100 mg at 01/28/17 1016 .  gabapentin (NEURONTIN) capsule 600 mg, 600 mg, Oral, TID, Loura Halt Ditty, MD, 600 mg at 01/28/17 1016 .  HYDROcodone-acetaminophen (NORCO) 7.5-325 MG per tablet 1 tablet, 1 tablet, Oral, Q6H PRN, Darci Current Costella, PA-C .  lactated ringers infusion, , Intravenous, Continuous, Marcene Duos, MD, Last Rate: 10 mL/hr at 01/24/17 0724 .  menthol-cetylpyridinium (CEPACOL) lozenge 3 mg, 1 lozenge, Oral, PRN **OR** phenol (CHLORASEPTIC) mouth spray 1 spray, 1 spray, Mouth/Throat, PRN,  Darci Current Costella, PA-C, 1 spray at 01/25/17 0835 .  methocarbamol (ROBAXIN) tablet 750 mg, 750 mg, Oral, Q6H PRN, Vincent J Costella, PA-C .  ondansetron (ZOFRAN) tablet 4 mg, 4 mg, Oral, Q6H PRN **OR** ondansetron (ZOFRAN) injection 4 mg, 4 mg, Intravenous, Q6H PRN, Darci Current Costella, PA-C .  oxyCODONE (Oxy IR/ROXICODONE) immediate release tablet 5-10 mg, 5-10 mg, Oral, Q3H PRN, Darci Current Costella, PA-C, 10 mg at 01/28/17 0157 .  oxyCODONE (OXYCONTIN) 12 hr tablet 30 mg, 30 mg, Oral, Q12H, Loura Halt Ditty, MD, 30 mg at 01/28/17 1016 .  pantoprazole (PROTONIX) EC tablet 40 mg, 40 mg, Oral, QHS, Loura Halt Ditty, MD, 40 mg at 01/27/17 2209 .  senna (SENOKOT) tablet 8.6 mg, 1 tablet, Oral, BID, Vincent J Costella, PA-C, 8.6 mg at 01/28/17 1016 .  senna-docusate (Senokot-S) tablet 1 tablet, 1 tablet, Oral, QHS PRN, Darci Current Costella, PA-C .  sodium chloride flush (NS) 0.9 % injection 3 mL, 3 mL, Intravenous, Q12H, Vincent J Costella, PA-C, 3 mL at 01/28/17 1016 .  sodium chloride flush (NS) 0.9 % injection 3 mL, 3 mL, Intravenous, PRN, Vincent J Costella, PA-C .  sodium phosphate (FLEET) 7-19 GM/118ML enema 1 enema, 1 enema, Rectal, Once PRN, Vincent J Costella, PA-C .  zolpidem (AMBIEN) tablet 5 mg, 5 mg, Oral, QHS PRN,MR X 1, Vincent J Costella, PA-C  Patients Current Diet: Diet Carb Modified Fluid consistency: Thin; Room service appropriate? Yes  Precautions / Restrictions Precautions Precautions: Cervical, Fall Precaution Comments: 3/3 precautions recalled at beginning of session. 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  Independent  Indoor Mobility: Did the patient need assistance with walking from room to room (with or without device)? Independent  Stairs: Did the patient need assistance with internal or external stairs (with or without device)? Independent  Functional Cognition: Did the patient need help planning regular tasks such as shopping or remembering to take medications? Independent  Current Functional Level Cognition  Overall Cognitive Status: Impaired/Different from baseline Orientation Level: Oriented X4 Following Commands: Follows one step commands consistently, Follows one step commands with increased time Safety/Judgement: Decreased awareness of safety General Comments: indecisive throughout session. pt attempted to  sit up without using log roll technique and required multimodal cues for compliance with precautions.     Extremity Assessment (includes Sensation/Coordination)  Upper Extremity Assessment: RUE deficits/detail, LUE deficits/detail RUE Deficits / Details: Reports in tact sensation but heaviness. PROM WFL. Very limited deltoid strength (2/5) although R stronger than L. 4/5 strength for elbow flexion and extension as well as grasp. Decreased motor planning during feeding tasks and poor coordination. RUE Sensation: decreased light touch, decreased proprioception RUE Coordination: decreased fine motor, decreased gross motor LUE Deficits / Details: Slow and poorly coordinated movement with decreased motor planning abilities. Minimal anterior deltoid strength 2-/5. 4-/5 strength for elbow flexion/extension and grasp.  Reports numbness and noted decreased sensation for light touch. LUE Sensation: decreased light touch, decreased proprioception LUE Coordination: decreased fine motor, decreased gross motor  Lower Extremity Assessment: Defer to PT evaluation    ADLs  Overall ADL's : Needs assistance/impaired Eating/Feeding: Moderate assistance, Bed level Eating/Feeding Details (indicate cue type and reason): Supportive assistance for coordinating and lifting arm to bring bacon to mouth. Grooming: Moderate assistance, Sitting Upper Body Bathing: Maximal assistance, Sitting Lower Body Bathing: Maximal assistance, Sit to/from stand Upper Body Dressing : Maximal assistance, Sitting Lower Body Dressing: Maximal assistance, Sit to/from stand Toilet Transfer: Minimal assistance, RW, Ambulation Toilet Transfer Details (indicate cue type and reason): Has the strength in triceps and grasp in order to utilize RW. Toileting- Clothing Manipulation and Hygiene: Minimal assistance, Sit to/from stand Functional mobility during ADLs: Minimal assistance, Rolling walker General ADL Comments: declined oob    Mobility  Overal bed mobility: Needs Assistance Bed Mobility: Rolling, Sidelying to Sit Rolling: Min assist Sidelying to sit: Min guard Sit to sidelying: Min assist General bed mobility comments: Hand over hand required to reach L hand to rail for rolling.  VC's required for log roll sequence.    Transfers  Overall transfer level: Needs assistance Equipment used: Rolling walker (2 wheeled) Transfers: Sit to/from Stand Sit to Stand: Min assist General transfer comment: Min physical assist for power up.  Extra time and VC's required for hand placement and precaution maintenance.    Ambulation / Gait / Stairs / Wheelchair Mobility  Ambulation/Gait Ambulation/Gait assistance: Min guard, +2 safety/equipment Ambulation Distance (Feet): 200 Feet Assistive device: Rolling walker (2 wheeled) Gait Pattern/deviations:  Decreased stride length, Shuffle, Drifts right/left, Narrow base of support, Decreased dorsiflexion - right General Gait Details: Bilateral decreased heel strike and bilateral pidgeon toes. Pt states he has always been pidgeon toed, knock-kneed and bowlegged.   VC's required for dorsiflexion and heel strikes.  Pt drifted R but was able to correct and get centered after.  Chair follow for safety but not utilized.  2 30 second standing breaks needed.  O2 sat at rest: 97%, during ambulation: 96-97% at standing break. Gait velocity: decreased Gait velocity interpretation: Below normal speed for age/gender Stairs: Yes Stairs assistance: Min guard Stair Management: No rails, Step to pattern, Forwards Number of Stairs: 2 General stair comments: min guard for safety. verbal cues for technique of up with good, down with bad    Posture / Balance Dynamic Sitting Balance Sitting balance - Comments: slouched posture required verbal cues  Balance Overall balance assessment: Needs assistance Sitting-balance support: No upper extremity supported, Feet supported Sitting balance-Leahy Scale: Fair Sitting balance - Comments: slouched posture required verbal cues  Standing balance support: Bilateral upper extremity supported Standing balance-Leahy Scale: Poor Standing balance comment: reliant on RW    Special needs/care consideration BiPAP/CPAP  N/a CPM  n/a Continuous Drip IV  N/a Dialysis  N/a Life Vest  N/a Oxygen  N/a Special Bed   N/a Trach Size  N/a Wound Vac (area)  N/a Skin surgical incision   Bowel mgmt: LBM 01/28/2017 Bladder mgmt: continent Diabetic mgmt continent  Needs help with eating meals due to weakness in arms and hands per pt    Previous Home Environment Living Arrangements: Parent (lives with 75 yo Mom and brother, Environmental education officer)  Lives With: Family Available Help at Discharge: Family, Available 24 hours/day (Daryl retired and can assist ) Type of Home: House Home Layout: One  level Home Access: Stairs to enter Entrance Stairs-Rails: None Secretary/administrator of Steps: 1 Bathroom Shower/Tub: Hydrographic surveyor, Engineer, building services: Pharmacist, community: Yes Home Care Services: No Additional Comments: has rail in tub for his Mom to be able to get in and out of tub, she is 62 years old  Discharge Living Setting Plans for Discharge Living Setting: Lives with (comment) (Mom and his brother, Daryl) Type of Home at Discharge: House Discharge Home Layout: One level Discharge Home Access: Stairs to enter Entrance Stairs-Rails: None Entrance Stairs-Number of Steps: 1 Discharge Bathroom Shower/Tub: Tub/shower unit, Curtain Discharge Bathroom Toilet: Standard Discharge Bathroom Accessibility: Yes How Accessible: Accessible via walker Does the patient have any problems obtaining your medications?: No  Social/Family/Support Systems Patient Roles: Parent Contact Information: [place], [place]. his son is main contact Anticipated Caregiver: Nelma Rothman, brother and other family members as needed. Has 5 girls and 1 boy Anticipated Caregiver's Contact Information: see above Ability/Limitations of Caregiver: none Caregiver Availability: 24/7 Discharge Plan Discussed with Primary Caregiver: Yes Is Caregiver In Agreement with Plan?: Yes Does Caregiver/Family have Issues with Lodging/Transportation while Pt is in Rehab?: No  Goals/Additional Needs Patient/Family Goal for Rehab: supervision to MOd I with PT, supervision to Min assist with OT, Mod I with SLP Expected length of stay: ELOS 5- 9 days Special Service Needs: Pt needs assist to eat meals Pt/Family Agrees to Admission and willing to participate: Yes Program Orientation Provided & Reviewed with Pt/Caregiver Including Roles  & Responsibilities: Yes  Decrease burden of Care through IP rehab admission: n/a  Possible need for SNF placement upon discharge: not anticipated  Patient Condition: This patient's  condition remains as documented in the consult dated 01/27/2017, in which the Rehabilitation Physician determined and documented that the patient's condition is appropriate for intensive rehabilitative care in an inpatient rehabilitation facility. Will admit to inpatient rehab today.  Preadmission Screen Completed By:  Clois Dupes, 01/28/2017 11:50 AM ______________________________________________________________________   Discussed status with Dr. Allena Katz on 01/28/2017 at  1154 and received telephone approval for admission today.  Admission Coordinator:  Clois Dupes, time 8938 Date 01/28/2017

## 2017-01-28 NOTE — Progress Notes (Signed)
Patient arrived from other unit with mother and friend. Oriented to unit and discussed safety plan. Pain medication given. Patient resting comfortably.

## 2017-01-29 ENCOUNTER — Inpatient Hospital Stay (HOSPITAL_COMMUNITY): Payer: Medicaid Other | Admitting: Speech Pathology

## 2017-01-29 ENCOUNTER — Inpatient Hospital Stay (HOSPITAL_COMMUNITY): Payer: Medicaid Other | Admitting: Physical Therapy

## 2017-01-29 ENCOUNTER — Inpatient Hospital Stay (HOSPITAL_COMMUNITY): Payer: Medicaid Other | Admitting: Occupational Therapy

## 2017-01-29 ENCOUNTER — Inpatient Hospital Stay (HOSPITAL_COMMUNITY): Payer: Medicaid Other

## 2017-01-29 DIAGNOSIS — N183 Chronic kidney disease, stage 3 (moderate): Secondary | ICD-10-CM

## 2017-01-29 DIAGNOSIS — M7989 Other specified soft tissue disorders: Secondary | ICD-10-CM

## 2017-01-29 DIAGNOSIS — M5412 Radiculopathy, cervical region: Secondary | ICD-10-CM

## 2017-01-29 DIAGNOSIS — D72829 Elevated white blood cell count, unspecified: Secondary | ICD-10-CM

## 2017-01-29 LAB — COMPREHENSIVE METABOLIC PANEL
ALK PHOS: 42 U/L (ref 38–126)
ALT: 24 U/L (ref 17–63)
ANION GAP: 10 (ref 5–15)
AST: 23 U/L (ref 15–41)
Albumin: 2.7 g/dL — ABNORMAL LOW (ref 3.5–5.0)
BILIRUBIN TOTAL: 0.5 mg/dL (ref 0.3–1.2)
BUN: 46 mg/dL — ABNORMAL HIGH (ref 6–20)
CALCIUM: 8.6 mg/dL — AB (ref 8.9–10.3)
CO2: 26 mmol/L (ref 22–32)
Chloride: 102 mmol/L (ref 101–111)
Creatinine, Ser: 1.62 mg/dL — ABNORMAL HIGH (ref 0.61–1.24)
GFR, EST AFRICAN AMERICAN: 51 mL/min — AB (ref >60)
GFR, EST NON AFRICAN AMERICAN: 44 mL/min — AB (ref >60)
Glucose, Bld: 130 mg/dL — ABNORMAL HIGH (ref 65–99)
Potassium: 4.3 mmol/L (ref 3.5–5.1)
Sodium: 138 mmol/L (ref 135–145)
TOTAL PROTEIN: 6.9 g/dL (ref 6.5–8.1)

## 2017-01-29 LAB — CBC WITH DIFFERENTIAL/PLATELET
Basophils Absolute: 0 10*3/uL (ref 0.0–0.1)
Basophils Relative: 0 %
EOS ABS: 0 10*3/uL (ref 0.0–0.7)
Eosinophils Relative: 0 %
HEMATOCRIT: 28.2 % — AB (ref 39.0–52.0)
HEMOGLOBIN: 9.1 g/dL — AB (ref 13.0–17.0)
LYMPHS ABS: 1.3 10*3/uL (ref 0.7–4.0)
Lymphocytes Relative: 9 %
MCH: 28.9 pg (ref 26.0–34.0)
MCHC: 32.3 g/dL (ref 30.0–36.0)
MCV: 89.5 fL (ref 78.0–100.0)
MONOS PCT: 9 %
Monocytes Absolute: 1.3 10*3/uL — ABNORMAL HIGH (ref 0.1–1.0)
NEUTROS PCT: 82 %
Neutro Abs: 12 10*3/uL — ABNORMAL HIGH (ref 1.7–7.7)
Platelets: 303 10*3/uL (ref 150–400)
RBC: 3.15 MIL/uL — ABNORMAL LOW (ref 4.22–5.81)
RDW: 14.6 % (ref 11.5–15.5)
WBC: 14.6 10*3/uL — ABNORMAL HIGH (ref 4.0–10.5)

## 2017-01-29 MED ORDER — ALUM & MAG HYDROXIDE-SIMETH 200-200-20 MG/5ML PO SUSP
15.0000 mL | ORAL | Status: DC | PRN
  Administered 2017-01-29 – 2017-01-30 (×2): 15 mL via ORAL
  Filled 2017-01-29 (×2): qty 30

## 2017-01-29 NOTE — Progress Notes (Signed)
Social Work Patient ID: Russell Carter, male   DOB: 11/21/1955, 61 y.o.   MRN: [id number]   CSW attempted to see pt, but he was too fatigued to talk with CSW.  He reported needing rest and would fall asleep during visit.  CSW told him that CSW would return tomorrow.  Full assessment to follow after that visit.

## 2017-01-29 NOTE — Progress Notes (Signed)
*  Preliminary Results* Bilateral lower extremity venous duplex completed. Bilateral lower extremities are negative for deep vein thrombosis. There is no evidence of Baker's cyst bilaterally.  01/29/2017 12:48 PM Gertie Fey, BS, RVT, RDCS, RDMS

## 2017-01-29 NOTE — Evaluation (Signed)
Physical Therapy Assessment and Plan  Patient Details  Name: Russell Carter MRN: [id number] Date of Birth: Apr 25, 1956  PT Diagnosis: Abnormality of gait, Difficulty walking, Muscle weakness and Pain in neck Rehab Potential: Good ELOS: 14 days   Today's Date: 01/29/2017 PT Individual Time: 0800-0900 PT Individual Time Calculation (min): 60 min    Problem List:  Patient Active Problem List   Diagnosis Date Noted  . Debility   . Post-operative pain   . Radicular pain   . Diabetes mellitus type 2 in nonobese (HCC)   . Surgery, elective   . Postoperative pain   . Leukocytosis   . Thrombocytopenia ([place])   . Acute blood loss anemia   . History of myocardial infarction   . Cocaine abuse   . Depression   . Type 2 diabetes mellitus with hyperglycemia, without long-term current use of insulin ([place])   . Stage 3 chronic kidney disease   . Benign essential HTN   . History of CVA (cerebrovascular accident)   . Prediabetes   . Cervical spondylosis with radiculopathy 01/24/2017  . Dyslipidemia 03/15/2016  . Unstable angina ([place]) - chest pain with complete heart block & chest pain 03/15/2016  . Complete heart block ([place]) 03/14/2016  . Cervical spinal stenosis 03/14/2016  . Chest pain with moderate risk of acute coronary syndrome 03/13/2016  . CAD-50% LAD 2011 12/14/2014  . Gout 11/16/2014  . Headache 07/15/2013  . Polysubstance abuse 01/15/2013  . DIZZINESS 06/05/2010  . Essential hypertension 05/18/2009  . Acute myocardial infarction 05/18/2009  . MUSCLE SPASM 05/18/2009    Past Medical History:  Past Medical History:  Diagnosis Date  . Anginal pain ([place])   . Arthritis   . Blood in stool   . CAD (coronary artery disease)    a. 2011: cath showing mod non-obstructive disease b. 02/2016: cath with 95% stenosis in the Mid RCA. DES placed.  . Cervical stenosis of spine   . Cocaine abuse    history  . Depression   . Diabetes mellitus without complication ([place])   . Gout,  unspecified   . Headache   . HTN (hypertension)   . Hypercholesterolemia   . Hyperglycemia   . Migraines   . Myocardial infarction 2008   drug cocaine and marijuana at time of mi none  since    . Pneumonia   . Renal insufficiency   . Stroke [facility])    Past Surgical History:  Past Surgical History:  Procedure Laterality Date  . ANTERIOR CERVICAL DECOMP/DISCECTOMY FUSION N/A 01/24/2017   Procedure: Cervical Four-Five,Cervical Five Six,Cervical Six-Seven Anterior cervical discectomy with fusion and plate with Cervical Three-Thoracic- One Laminectomy/Foraminotomy with Cervical Three-Thoracic- One Fixation and fusion;  Surgeon: Kevan Ny Ditty, MD;  Location: [place];  Service: Neurosurgery;  Laterality: N/A;  Part-1 anterior approach  . CARDIAC CATHETERIZATION  2008; ~ 2011   [place]-40%lad  . CARDIAC CATHETERIZATION N/A 03/16/2016   Procedure: Left Heart Cath and Coronary Angiography;  Surgeon: Leonie Man, MD;  Location: [place] CV LAB;  Service: Cardiovascular;  Laterality: N/A;  . CORONARY ANGIOPLASTY     STENTS  . EYE SURGERY Bilateral    "for double vision"  . INGUINAL HERNIA REPAIR  05/12/2012   Procedure: HERNIA REPAIR INGUINAL ADULT;  Surgeon: Zenovia Jarred, MD;  Location: [place];  Service: General;  Laterality: Right;  . INGUINAL HERNIA REPAIR Left 02/02/2003   Dr. Georganna Skeans. repair with mesh. Same procedure done again in  05/22/2004   .  INGUINAL HERNIA REPAIR Left ?date 2nd OR  . POSTERIOR CERVICAL FUSION/FORAMINOTOMY N/A 01/24/2017   Procedure: Cervical Three-Thoracic One-Laminectomy/Foraminotomy with Cervical Three-Thoracic One Fixation and fusion;  Surgeon: Kevan Ny Ditty, MD;  Location: [place];  Service: Neurosurgery;  Laterality: N/A;  Part-2 Posterior approach  . RECONSTRUCT / STABILIZE DISTAL ULNA    . SHOULDER OPEN ROTATOR CUFF REPAIR Left 2010  . SHOULDER SURGERY Left    "injured on the job; had to cut small bone out"  . THUMB FUSION  Right     Assessment & Plan Clinical Impression: Russell Carter a 61 y.o. right handed malewith pmh of arthritis, CAD status post MI maintained on aspirin and Brilinta, cervical stenosis, cocaine and alcohol abuse, depression, gout, diabetes, HTN, CKD stage III, CVA with little residual weakness. Per chart review patient lives with mother and brother. Independent working as a Animal nutritionist. One level home with one-step entry. Presented on 4/5 due to cervical spondylosis with radiculopathy. He had associated severe pain in neck and shoulder. He underwent C4-7 anterior discectomy with fusion, posterior cervical fusion C3-C6 and laminectomy C3-T1 per Dr. Cyndy Freeze. Cervical collar at all times. Decadron protocol as advised. Hospital course complicatedby postop pain with OxyContin sustained release added, leukocytosis 13,600, thrombocytopenia 146,000, ABLA 9.6 and monitored. Physical and occupational therapy evaluations completed with recommendations of physical medicine rehabilitation consult. Patient was admitted for a comprehensive rehabilitation program  Patient transferred to CIR on 01/28/2017 .   Patient currently requires min with mobility secondary to muscle weakness and muscle paralysis, decreased cardiorespiratoy endurance, unbalanced muscle activation and decreased coordination and decreased sitting balance, decreased standing balance, decreased postural control and decreased balance strategies.  Prior to hospitalization, patient was independent  with mobility and lived with Family in a House home.  Home access is 1Stairs to enter.  Patient will benefit from skilled PT intervention to maximize safe functional mobility, minimize fall risk and decrease caregiver burden for planned discharge home with 24 hour supervision.  Anticipate patient will benefit from follow up OP at discharge.  PT - End of Session Activity Tolerance: Tolerates 30+ min activity with multiple rests Endurance Deficit:  Yes Endurance Deficit Description: requires seated rest breaks following short duration mobility tasks PT Assessment Rehab Potential (ACUTE/IP ONLY): Good PT Patient demonstrates impairments in the following area(s): Balance;Motor;Endurance;Pain;Safety;Sensory PT Transfers Functional Problem(s): Bed Mobility;Bed to Chair;Car;Furniture PT Locomotion Functional Problem(s): Stairs;Wheelchair Mobility;Ambulation PT Plan PT Intensity: Minimum of 1-2 x/day ,45 to 90 minutes PT Frequency: 5 out of 7 days PT Duration Estimated Length of Stay: 14 days PT Treatment/Interventions: Ambulation/gait training;Balance/vestibular training;Community reintegration;Functional electrical stimulation;DME/adaptive equipment instruction;Disease management/prevention;Discharge planning;Functional mobility training;Neuromuscular re-education;Pain management;Patient/family education;Stair training;UE/LE Coordination activities;UE/LE Strength taining/ROM;Splinting/orthotics;Therapeutic Exercise;Wheelchair propulsion/positioning;Therapeutic Activities;Psychosocial support PT Transfers Anticipated Outcome(s): modI PT Locomotion Anticipated Outcome(s): S with LRAD in home and community PT Recommendation Recommendations for Other Services: Therapeutic Recreation consult Therapeutic Recreation Interventions: Outing/community reintergration Follow Up Recommendations: Outpatient PT Patient destination: Home Equipment Recommended: To be determined  Skilled Therapeutic Intervention Pt received seated on EOB; c/o pain as below and agreeable to treatment. PT assisted pt with eating breakfast d/t UE deficits; alerted RN to need for full supervision/assist with meals. PT evaluation performed and completed with minA overall as described below. Pt limited by UE deficits consistent with c5 radiculopathy and difficulty using UEs for assist during functional mobility tasks such as managing RW and using rails on stairs. Pt also demonstrates  poor activity tolerance throughout session. Educated pt on rehab process, goals, estimated LOS, falls prevention  safety; pt agreeable to all the above. Remained seated in w/c at end of session, all needs in reach.   PT Evaluation Precautions/Restrictions Restrictions Weight Bearing Restrictions: No General Chart Reviewed: Yes Response to Previous Treatment: Not applicable Family/Caregiver Present: No Pain Pain Assessment Pain Assessment: 0-10 Pain Score: 5  Pain Type: Acute pain Pain Location: Neck Pain Descriptors / Indicators: Aching Pain Frequency: Intermittent Pain Onset: On-going Patients Stated Pain Goal: 3 Pain Intervention(s): Medication (See eMAR) Home Living/Prior Functioning Home Living Available Help at Discharge: Family;Available 24 hours/day Type of Home: House Home Access: Stairs to enter [employer] of Steps: 1 Entrance Stairs-Rails: None Home Layout: One level  Lives With: Family Prior Function Level of Independence: Independent with gait;Independent with basic ADLs;Independent with homemaking with ambulation;Independent with transfers  Able to Take Stairs?: Yes Driving: Yes Vocation: Full time employment Comments: works part time as Presenter, broadcasting, 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 Vision/Perception  Perception Comments: WFL  Cognition Overall Cognitive Status: Within Functional Limits for tasks assessed Orientation Level: Oriented X4 Attention: Sustained Sustained Attention: Appears intact Awareness: Appears intact Problem Solving: Appears intact Safety/Judgment: Appears intact Sensation Sensation Light Touch: Appears Intact (BLE) Proprioception: Appears Intact (BLE) Coordination Gross Motor Movements are Fluid and Coordinated: Yes (BLE) Heel Shin Test: [facility] Motor  Motor Motor: Paraplegia Motor - Skilled Clinical Observations: BUE c5 radiculopathy, L more affected than R  Mobility Bed Mobility Bed Mobility: Supine to Sit;Sit to  Supine Supine to Sit: 5: Supervision Supine to Sit Details: Verbal cues for technique;Verbal cues for precautions/safety Sit to Supine: 5: Supervision Sit to Supine - Details: Verbal cues for technique;Verbal cues for precautions/safety Transfers Transfers: Yes Sit to Stand: 4: Min guard;Without upper extremity assist Stand Pivot Transfers: 4: Min guard;4: Min Psychologist, occupational Details: Verbal cues for technique;Verbal cues for precautions/safety;Verbal cues for safe use of DME/AE Stand Pivot Transfer Details (indicate cue type and reason): cues for RW management, upright posture Locomotion  Ambulation Ambulation: Yes Ambulation/Gait Assistance: 4: Min guard Ambulation Distance (Feet): 120 Feet Assistive device: Rolling walker Gait Gait: Yes Gait Pattern: Impaired Gait Pattern: Poor foot clearance - left;Poor foot clearance - right;Narrow base of support;Trunk flexed;Decreased stride length Gait velocity: decreased Stairs / Additional Locomotion Stairs: Yes Stairs Assistance: 4: Min guard Stair Management Technique: Two rails;Step to pattern;Alternating pattern;Forwards Number of Stairs: 12 Height of Stairs: 3 Ramp: 4: Min assist Curb: 4: Min Chemical engineer: Yes Wheelchair Assistance: 3: Building surveyor Details: Manual facilitation for placement;Verbal cues for technique;Verbal cues for Information systems manager: Both upper extremities Wheelchair Parts Management: Needs assistance Distance: 25  Trunk/Postural Assessment  Cervical Assessment Cervical Assessment: Exceptions to [facility] (not assessed; limited d/t aspen collar) Thoracic Assessment Thoracic Assessment: Within Functional Limits Lumbar Assessment Lumbar Assessment: Within Functional Limits Postural Control Postural Control: Deficits on evaluation (delayed stepping/righting reactions)  Balance Balance Balance Assessed: Yes Static Sitting  Balance Static Sitting - Balance Support: Feet supported;No upper extremity supported Static Sitting - Level of Assistance: 6: Modified independent (Device/Increase time) Dynamic Sitting Balance Dynamic Sitting - Balance Support: Feet supported;No upper extremity supported Dynamic Sitting - Level of Assistance: 5: Stand by assistance Dynamic Sitting - Balance Activities: Lateral lean/weight shifting;Forward lean/weight shifting Static Standing Balance Static Standing - Balance Support: No upper extremity supported Static Standing - Level of Assistance: 5: Stand by assistance Dynamic Standing Balance Dynamic Standing - Balance Support: No upper extremity supported Dynamic Standing - Level of Assistance: 4: Min assist Dynamic Standing -  Balance Activities: Lateral lean/weight shifting;Forward lean/weight shifting Extremity Assessment  RUE Assessment RUE Assessment: Exceptions to [facility] (Defer to OT evaluation) LUE Assessment LUE Assessment: Exceptions to [facility] (Defer to OT evaluation) RLE Assessment RLE Assessment: Within Functional Limits (4+/5 throughout) LLE Assessment LLE Assessment: Within Functional Limits (4+/5 throughout)   See Function Navigator for Current Functional Status.   Refer to Care Plan for Long Term Goals  Recommendations for other services: Therapeutic Recreation  Outing/community reintegration  Discharge Criteria: Patient will be discharged from PT if patient refuses treatment 3 consecutive times without medical reason, if treatment goals not met, if there is a change in medical status, if patient makes no progress towards goals or if patient is discharged from hospital.  The above assessment, treatment plan, treatment alternatives and goals were discussed and mutually agreed upon: by patient  Luberta Mutter 01/29/2017, 9:01 AM

## 2017-01-29 NOTE — Progress Notes (Signed)
[place] PHYSICAL MEDICINE & REHABILITATION     PROGRESS NOTE    Subjective/Complaints: Pt had restless night. Some of it was due to pain. Anxious to get up and moving around today. States his neck/upper back pain is 8/10  ROS: pt denies nausea, vomiting, diarrhea, cough, shortness of breath or chest pain   Objective: Vital Signs: Blood pressure (!) 147/80, pulse 64, temperature 98 F (36.7 C), temperature source Oral, resp. rate 18, height 5\' 11"  (1.803 m), weight 86.7 kg (191 lb 2.2 oz), SpO2 97 %. Mr Cervical Spine Wo Contrast  Result Date: 01/27/2017 CLINICAL DATA:  Initial evaluation for cervical spondylosis with radiculopathy. Status post decompression with fusion. EXAM: MRI CERVICAL SPINE WITHOUT CONTRAST TECHNIQUE: Multiplanar, multisequence MR imaging of the cervical spine was performed. No intravenous contrast was administered. COMPARISON:  Prior MRI from 03/13/2016. FINDINGS: Alignment: Straightening of the normal cervical lordosis. No listhesis. Vertebrae: Vertebral body heights maintained. Signal intensity within the vertebral body bone marrow is normal. Postoperative changes from recent spinal fusion with decompression are evident. Patient has undergone ACDF at through C7. Posterior decompression with laminectomies have been performed at C3 through T1. Posterior fusion in place at C3 through T1 as well. Hardware appears well positioned. Cord: Signal intensity within the cervical spinal cord is normal. Posterior Fossa, vertebral arteries, paraspinal tissues: Visualized brain and posterior fossa within normal limits. Craniocervical junction normal. Large T2 hyperintense collection at the posterior surgical measures 14.9 x 5.1 x 2.3 cm in greatest transaxial dimensions. Additional fluid partially visualized subjacent to the inferior aspect of the left trapezius muscle. Finding most likely reflects a postoperative seroma. While possible CSF leak not entirely excluded, no sagging of the  brain at the level of the foramen magnum seen to suggest intracranial hypotension. Extensive prevertebral edema not with fluid with present as well related to anterior cervical ACDF. Probable surgical drain in place. Normal intravascular flow voids present within the vertebral arteries bilaterally. Disc levels: C2-C3: Mild diffuse chronic degenerative disc osteophyte complex mildly indents the ventral thecal sac. Mild facet arthrosis. No significant canal or foraminal stenosis. C3-C4: Patient is status post posterior decompression. Stable mild diffuse disc bulge. Moderate to severe canal stenosis related to the postoperative collection. Mild to moderate residual foraminal narrowing, slightly worse on the left, which appears improved from previous. C4-C5: Status post ACDF and posterior decompression. Moderate to severe canal stenosis related to the postoperative collection. No appreciable residual foraminal stenosis. C5-C6: Status post ACDF and posterior decompression. Moderate to severe canal stenosis related to the posterior collection. No appreciable residual foraminal narrowing. C6-C7: Status post ACDF and posterior decompression. Moderate to severe canal stenosis related to the posterior collection. No appreciable residual foraminal stenosis. C7-T1: Status post posterior decompression with fusion. Stable degenerative disc bulge. No significant canal stenosis. Probable improved bilateral foraminal narrowing. Visualized upper thoracic spine within normal limits. IMPRESSION: 1. Postoperative changes from recent ACDF at C4 through C7, with posterior decompression and fusion at C3 through T1. 2. 14.9 x 5.1 x 2.3 cm collection at the posterior surgical site, most likely a postoperative seroma. While CSF leak is not entirely excluded, no sagging of the brain at the craniocervical junction to suggest CNS hypotension identified. As always, superimposed infection could be considered in the correct clinical setting. 3.  Moderate to severe diffuse canal stenosis at C3-4 through C7-T1, largely due to the postoperative collection. Foraminal narrowing at these levels appears improved relative to preoperative exam from 03/13/2016. Electronically Signed   By: Rise Mu  M.D.   On: 01/27/2017 22:47    Recent Labs  01/26/17 0928 01/29/17 0726  WBC 13.6* 14.6*  HGB 9.6* 9.1*  HCT 29.4* 28.2*  PLT 146* 303    Recent Labs  01/26/17 0928  NA 135  K 4.0  CL 100*  GLUCOSE 117*  BUN 19  CREATININE 1.46*  CALCIUM 8.0*   CBG (last 3)   Recent Labs  01/26/17 1608 01/26/17 2200 01/27/17 0711  GLUCAP 90 101* 108*    Wt Readings from Last 3 Encounters:  01/28/17 86.7 kg (191 lb 2.2 oz)  01/24/17 89.8 kg (197 lb 15.6 oz)  01/21/17 90 kg (198 lb 8 oz)    Physical Exam:  Constitutional: He appears well-developed and well-nourished.  HENT:  Head: Normocephalic and atraumatic.  Eyes: EOMI.  Neck:  Cervical collar in place and fitting Cardiovascular: RRR.   Respiratory: CTA B GI: Soft. Bowel sounds are normal. He exhibits no distension.  Musculoskeletal: He exhibits edema. He exhibits no tenderness.  Neurological: He is alert.  Sensation diminished to light touch in shoulders, hands, feet---inconsistent Hoarse voice but quite intelligible.  Motor: B/l LE: 4-4+/5 proximal to distal B/L UE: Shoulder abduction 2/5, Right elbow flexion/extension 4/5, hand 4/5 with. Left biceps 3/5. Left triceps 4/5. HI 4-5/5.  Skin:  Incision c/d/i with honeycomb dressing Psychiatric: anxious  Assessment/Plan: 1. Debility and functional deficits secondary to cervical spondylosis/radiculopathy (s/p C4-7 ACDF, C3-6 PCF)  which require 3+ hours per day of interdisciplinary therapy in a comprehensive inpatient rehab setting. Physiatrist is providing close team supervision and 24 hour management of active medical problems listed below. Physiatrist and rehab team continue to assess barriers to discharge/monitor  patient progress toward functional and medical goals.  Function:  Bathing Bathing position      Bathing parts      Bathing assist        Upper Body Dressing/Undressing Upper body dressing                    Upper body assist        Lower Body Dressing/Undressing Lower body dressing                                  Lower body assist        Toileting Toileting   Toileting steps completed by patient: Performs perineal hygiene Toileting steps completed by helper: Adjust clothing prior to toileting, Adjust clothing after toileting Toileting Assistive Devices: Grab bar or rail  Toileting assist Assist level: Touching or steadying assistance (Pt.75%)   Transfers Chair/bed Optician, dispensing          Cognition Comprehension Comprehension assist level: Follows basic conversation/direction with no assist  Expression Expression assist level: Expresses basic needs/ideas: With no assist  Social Interaction Social Interaction assist level: Interacts appropriately 90% of the time - Needs monitoring or encouragement for participation or interaction.  Problem Solving Problem solving assist level: Solves basic problems with no assist  Memory Memory assist level: Complete Independence: No helper   Medical Problem List and Plan: 1.  Debilitation with neck and shoulder pain secondary to cervical spondylosis with radiculopathy. Status post C4-7 anterior discectomy and fusion, posterior cervical fusion C3-C6. Cervical collar all times  -begin therapies  -team conference this afternoon 2.  DVT Prophylaxis/Anticoagulation: SCDs. Check vascular study 3. Pain Management: OxyContin sustained release 30 mg every 12 hours, Neurontin 600 mg 3 times a day, Oxycodone, Robaxin as needed  -follow for pain control as he mobilizes with therapy 4. Mood: Provide emotional support 5. Neuropsych: This patient is capable of making  decisions on his own behalf. 6. Skin/Wound Care: Routine skin checks 7. Fluids/Electrolytes/Nutrition: encourage PO  -labs pending today 8. Acute blood loss anemia. Follow-up CBC with hgb 9.1   9. CAD with history of MI. Patient on aspirin and Brilinta prior to admission. 10. Diet-controlled diabetes mellitus.. Diabetic teaching. Hemoglobin A1c 6.2 11. CKD stage III. Creatinine baseline 1.43-1.54, follow-up chemistries pending 12. History of CVA without residual. Discuss resuming aspirin. 13. Leukocytosis: WBC's trending up  -afebrile without any clinical signs of infection  -likely steroid related  -continue to monitor closely for now   LOS (Days) 1 A FACE TO FACE EVALUATION WAS PERFORMED  Ranelle Oyster, MD 01/29/2017 8:29 AM

## 2017-01-29 NOTE — Progress Notes (Signed)
Standley Brooking, RN Rehab Admission Coordinator Signed Physical Medicine and Rehabilitation  PMR Pre-admission Date of Service: 01/28/2017 11:49 AM  Related encounter: Admission (Discharged) from 01/24/2017 in [name]       [] Hide copied text PMR Admission Coordinator Pre-Admission Assessment  Patient: Russell Carter is an 61 y.o., male MRN: [id number] DOB: January 16, 1956 Height: 5\' 11"  (180.3 cm) Weight: 89.8 kg (197 lb 15.6 oz)                                                                                                                                                  Insurance Information HMO:     PPO:      PCP:      IPA:      80/20:      OTHER:  PRIMARY: Medicaid [place] [employer]      Policy#: [id number]      Subscriber: py  Medicaid Application Date:       Case Manager:  Disability Application Date:       Case Worker:  Pt states he has applied for disability in the past and that he has been denied  Emergency Contact Information        Contact Information    Name Relation Home Work Mobile   Loncar Jr,Akili Son   [phone]     Current Medical History  Patient Admitting Diagnosis: cervical spondylosis with radiculopathy  History of Present Illness: HPI: Almalik Minoris a 61 y.o.right handed malewith pmh of arthritis, CAD status post MImaintained on aspirinand Brilinta, cervical stenosis, cocaineand alcoholabuse, depression, gout, diabetes, HTN, CKDstage III, CVAwith little residual weakness. Presented on 4/5 due to cervical spondylosis with radiculopathy. He had associated severe pain in neck and shoulder. He underwent C4-7 anterior discectomy with fusion, posterior cervical fusion C3-C6 and laminectomy C3-T1per Dr. Bevely Palmer.Cervical collar at all times.Decadron protocol as advised.Hospital course complicatedby postop painwith OxyContin sustained release added, leukocytosis13,600, thrombocytopenia146,000, ABLA9.6 and  monitored.  Past Medical History      Past Medical History:  Diagnosis Date  . Anginal pain (HCC)   . Arthritis   . Blood in stool   . CAD (coronary artery disease)    a. 2011: cath showing mod non-obstructive disease b. 02/2016: cath with 95% stenosis in the Mid RCA. DES placed.  . Cervical stenosis of spine   . Cocaine abuse    history  . Depression   . Diabetes mellitus without complication (HCC)   . Gout, unspecified   . Headache   . HTN (hypertension)   . Hypercholesterolemia   . Hyperglycemia   . Migraines   . Myocardial infarction 2008   drug cocaine and marijuana at time of mi none  since    . Pneumonia   . Renal insufficiency   . Stroke [facility])     Family History  family history includes Heart attack in his brother, father, and mother.  Prior Rehab/Hospitalizations:  Has the patient had major surgery during 100 days prior to admission? No  Current Medications   Current Facility-Administered Medications:  .  0.9 %  sodium chloride infusion, , Intravenous, Continuous, Vincent J Costella, PA-C .  0.9 %  sodium chloride infusion, 250 mL, Intravenous, Continuous, Vincent J Costella, PA-C .  0.9 %  sodium chloride infusion, , Intravenous, Once, Loura Halt Ditty, MD .  acetaminophen (TYLENOL) tablet 1,000 mg, 1,000 mg, Oral, Q6H PRN, Darci Current Costella, PA-C .  alum & mag hydroxide-simeth (MAALOX/MYLANTA) 200-200-20 MG/5ML suspension 30 mL, 30 mL, Oral, Q6H PRN, Darci Current Costella, PA-C .  bisacodyl (DULCOLAX) suppository 10 mg, 10 mg, Rectal, Daily PRN, Vincent J Costella, PA-C .  dexamethasone (DECADRON) injection 4 mg, 4 mg, Intravenous, Q8H, Loura Halt Ditty, MD, 4 mg at 01/28/17 [id number] .  diazepam (VALIUM) tablet 5 mg, 5 mg, Oral, Q6H PRN, Darci Current Costella, PA-C, 5 mg at 01/25/17 1191 .  docusate sodium (COLACE) capsule 100 mg, 100 mg, Oral, BID, Vincent J Costella, PA-C, 100 mg at 01/28/17 1016 .  gabapentin (NEURONTIN) capsule  600 mg, 600 mg, Oral, TID, Loura Halt Ditty, MD, 600 mg at 01/28/17 1016 .  HYDROcodone-acetaminophen (NORCO) 7.5-325 MG per tablet 1 tablet, 1 tablet, Oral, Q6H PRN, Darci Current Costella, PA-C .  lactated ringers infusion, , Intravenous, Continuous, Marcene Duos, MD, Last Rate: 10 mL/hr at 01/24/17 0724 .  menthol-cetylpyridinium (CEPACOL) lozenge 3 mg, 1 lozenge, Oral, PRN **OR** phenol (CHLORASEPTIC) mouth spray 1 spray, 1 spray, Mouth/Throat, PRN, Darci Current Costella, PA-C, 1 spray at 01/25/17 0835 .  methocarbamol (ROBAXIN) tablet 750 mg, 750 mg, Oral, Q6H PRN, Vincent J Costella, PA-C .  ondansetron (ZOFRAN) tablet 4 mg, 4 mg, Oral, Q6H PRN **OR** ondansetron (ZOFRAN) injection 4 mg, 4 mg, Intravenous, Q6H PRN, Darci Current Costella, PA-C .  oxyCODONE (Oxy IR/ROXICODONE) immediate release tablet 5-10 mg, 5-10 mg, Oral, Q3H PRN, Darci Current Costella, PA-C, 10 mg at 01/28/17 0157 .  oxyCODONE (OXYCONTIN) 12 hr tablet 30 mg, 30 mg, Oral, Q12H, Loura Halt Ditty, MD, 30 mg at 01/28/17 1016 .  pantoprazole (PROTONIX) EC tablet 40 mg, 40 mg, Oral, QHS, Loura Halt Ditty, MD, 40 mg at 01/27/17 2209 .  senna (SENOKOT) tablet 8.6 mg, 1 tablet, Oral, BID, Vincent J Costella, PA-C, 8.6 mg at 01/28/17 1016 .  senna-docusate (Senokot-S) tablet 1 tablet, 1 tablet, Oral, QHS PRN, Darci Current Costella, PA-C .  sodium chloride flush (NS) 0.9 % injection 3 mL, 3 mL, Intravenous, Q12H, Vincent J Costella, PA-C, 3 mL at 01/28/17 1016 .  sodium chloride flush (NS) 0.9 % injection 3 mL, 3 mL, Intravenous, PRN, Vincent J Costella, PA-C .  sodium phosphate (FLEET) 7-19 GM/118ML enema 1 enema, 1 enema, Rectal, Once PRN, Vincent J Costella, PA-C .  zolpidem (AMBIEN) tablet 5 mg, 5 mg, Oral, QHS PRN,MR X 1, Vincent J Costella, PA-C  Patients Current Diet: Diet Carb Modified Fluid consistency: Thin; Room service appropriate? Yes  Precautions / Restrictions Precautions Precautions: Cervical, Fall Precaution  Comments: 3/3 precautions recalled at beginning of session. 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  Independent  Indoor Mobility: Did the patient need assistance with walking from room to room (with or without device)? Independent  Stairs: Did the patient need assistance with internal or external stairs (with or without device)? Independent  Functional Cognition: Did the patient need help planning regular tasks such as shopping or remembering to take medications? Independent  Current Functional Level Cognition  Overall Cognitive Status: Impaired/Different from baseline Orientation Level: Oriented X4 Following Commands: Follows one step commands consistently, Follows one step commands with increased time Safety/Judgement: Decreased awareness of safety General Comments: indecisive throughout session. pt attempted to sit up without using log roll technique and required multimodal cues for compliance with precautions.     Extremity Assessment (includes Sensation/Coordination)  Upper Extremity Assessment: RUE deficits/detail, LUE deficits/detail RUE Deficits / Details: Reports in tact sensation but heaviness. PROM WFL. Very limited deltoid strength (2/5) although R  stronger than L. 4/5 strength for elbow flexion and extension as well as grasp. Decreased motor planning during feeding tasks and poor coordination. RUE Sensation: decreased light touch, decreased proprioception RUE Coordination: decreased fine motor, decreased gross motor LUE Deficits / Details: Slow and poorly coordinated movement with decreased motor planning abilities. Minimal anterior deltoid strength 2-/5. 4-/5 strength for elbow flexion/extension and grasp. Reports numbness and noted decreased sensation for light touch. LUE Sensation: decreased light touch, decreased proprioception LUE Coordination: decreased fine motor, decreased gross motor  Lower Extremity Assessment: Defer to PT evaluation    ADLs  Overall ADL's : Needs assistance/impaired Eating/Feeding: Moderate assistance, Bed level Eating/Feeding Details (indicate cue type and reason): Supportive assistance for coordinating and lifting arm to bring bacon to mouth. Grooming: Moderate assistance, Sitting Upper Body Bathing: Maximal assistance, Sitting Lower Body Bathing: Maximal assistance, Sit to/from stand Upper Body Dressing : Maximal assistance, Sitting Lower Body Dressing: Maximal assistance, Sit to/from stand Toilet Transfer: Minimal assistance, RW, Ambulation Toilet Transfer Details (indicate cue type and reason): Has the strength in triceps and grasp in order to utilize RW. Toileting- Clothing Manipulation and Hygiene: Minimal assistance, Sit to/from stand Functional mobility during ADLs: Minimal assistance, Rolling walker General ADL Comments: declined oob    Mobility  Overal bed mobility: Needs Assistance Bed Mobility: Rolling, Sidelying to Sit Rolling: Min assist Sidelying to sit: Min guard Sit to sidelying: Min assist General bed mobility comments: Hand over hand required to reach L hand to rail for rolling.  VC's required for log roll sequence.    Transfers  Overall transfer level: Needs  assistance Equipment used: Rolling walker (2 wheeled) Transfers: Sit to/from Stand Sit to Stand: Min assist General transfer comment: Min physical assist for power up.  Extra time and VC's required for hand placement and precaution maintenance.    Ambulation / Gait / Stairs / Wheelchair Mobility  Ambulation/Gait Ambulation/Gait assistance: Min guard, +2 safety/equipment Ambulation Distance (Feet): 200 Feet Assistive device: Rolling walker (2 wheeled) Gait Pattern/deviations: Decreased stride length, Shuffle, Drifts right/left, Narrow base of support, Decreased dorsiflexion - right General Gait Details: Bilateral decreased heel strike and bilateral pidgeon toes. Pt states he has always been pidgeon toed, knock-kneed and bowlegged.  VC's required for dorsiflexion and heel strikes.  Pt drifted R but was able to correct and get centered after.  Chair follow for safety but not utilized.  2 30 second standing breaks needed.  O2 sat at rest: 97%, during ambulation: 96-97% at standing break. Gait velocity: decreased Gait velocity interpretation: Below normal speed for age/gender Stairs: Yes Stairs assistance: Min guard Stair Management: No rails, Step to pattern, Forwards Number of Stairs: 2 General stair comments: min guard for safety. verbal cues for technique of up with good, down with bad    Posture / Balance Dynamic Sitting Balance Sitting balance - Comments: slouched posture required verbal cues  Balance Overall balance assessment: Needs assistance Sitting-balance support: No upper extremity supported, Feet supported Sitting balance-Leahy Scale: Fair Sitting balance - Comments: slouched posture required verbal cues  Standing balance support: Bilateral upper extremity supported Standing balance-Leahy Scale: Poor Standing balance comment: reliant on RW    Special needs/care consideration BiPAP/CPAP  N/a CPM n/a Continuous Drip IV  N/a Dialysis  N/a Life Vest  N/a Oxygen   N/a Special Bed   N/a Trach Size  N/a Wound Vac (area)  N/a Skin surgical incision   Bowel mgmt: LBM 01/28/2017 Bladder mgmt: continent Diabetic mgmt continent  Needs help with eating meals due to weakness in arms and hands per pt    Previous Home Environment Living Arrangements: Parent (lives with 42 yo Mom and brother, Environmental education officer)  Lives With: Family Available Help at Discharge: Family, Available 24 hours/day (Daryl retired and can assist ) Type of Home: House Home Layout: One level Home Access: Stairs to enter Entrance Stairs-Rails: None Secretary/administrator of Steps: 1 Bathroom Shower/Tub: Hydrographic surveyor, Engineer, building services: Pharmacist, community: Yes Home Care Services: No Additional Comments: has rail in tub for his Mom to be able to get in and out of tub, she is 62 years old  Discharge Living Setting Plans for Discharge Living Setting: Lives with (comment) (Mom and his brother, Daryl) Type of Home at Discharge: House Discharge Home Layout: One level Discharge Home Access: Stairs to enter Entrance Stairs-Rails: None Entrance Stairs-Number of Steps: 1 Discharge Bathroom Shower/Tub: Tub/shower unit, Curtain Discharge Bathroom Toilet: Standard Discharge Bathroom Accessibility: Yes How Accessible: Accessible via walker Does the patient have any problems obtaining your medications?: No  Social/Family/Support Systems Patient Roles: Parent Contact Information: [place], [place]. his son is main contact Anticipated Caregiver: Nelma Rothman, brother and other family members as needed. Has 5 girls and 1 boy Anticipated Caregiver's Contact Information: see above Ability/Limitations of Caregiver: none Caregiver Availability: 24/7 Discharge Plan Discussed with Primary Caregiver: Yes Is Caregiver In Agreement with Plan?: Yes Does Caregiver/Family have Issues with Lodging/Transportation while Pt is in Rehab?: No  Goals/Additional Needs Patient/Family Goal for Rehab:  supervision to MOd I with PT, supervision to Min assist with OT, Mod I with SLP Expected length of stay: [facility]OS 5- 9 days Special Service Needs: Pt needs assist to eat meals Pt/Family Agrees to Admission and willing to participate: Yes Program Orientation Provided & Reviewed with Pt/Caregiver Including Roles  & Responsibilities: Yes  Decrease burden of Care through IP rehab admission: n/a  Possible need for SNF placement upon discharge: not anticipated  Patient Condition: This patient's condition remains as documented in the consult dated 01/27/2017, in which the Rehabilitation Physician determined and documented that the patient's condition is appropriate for intensive rehabilitative care in an inpatient rehabilitation facility. Will admit to inpatient rehab today.  Preadmission Screen Completed By:  Clois Dupes,  01/28/2017 11:50 AM ______________________________________________________________________   Discussed status with Dr. Allena Katz on 01/28/2017 at  1154 and received telephone approval for admission today.  Admission Coordinator:  Clois Dupes, time [id number] Date 01/28/2017       Cosigned by: Ankit Karis Juba, MD at 01/28/2017 12:05 PM  Revision History

## 2017-01-29 NOTE — Progress Notes (Signed)
Ankit Karis Juba, MD Physician Signed Physical Medicine and Rehabilitation  Consult Note Date of Service: 01/27/2017 9:21 AM  Related encounter: Admission (Discharged) from 01/24/2017 in [name] 5 [facility]ENTR[facility] NEURO SURGICAL     Expand All Collapse All   [] Hide copied text [] Hover for attribution information      Physical Medicine and Rehabilitation Consult Reason for Consult: Weakness Referring Physician: Dr. Bevely Palmer   HPI: Russell Carter is a 61 y.o. male with pmh of arthritis, CAD status post MI, cervical stenosis, cocaine abuse, depression, gout, diabetes, HTN, CKD, CVA presented on 4/5 due to cervical spondylosis with radiculopathy. He had associated severe pain in neck and shoulder. He underwent C4-7 anterior discectomy with fusion, posterior cervical fusion C3-C6 and laminectomy C3-T1. Hospital course complicated by postop pain, leukocytosis, thrombocytopenia, ABLA.  Review of Systems  Gastrointestinal: Positive for constipation.  Musculoskeletal: Positive for back pain, joint pain, myalgias and neck pain.  Neurological: Positive for tingling, sensory change, speech change and focal weakness.  All other systems reviewed and are negative.      Past Medical History:  Diagnosis Date  . Anginal pain (HCC)   . Arthritis   . Blood in stool   . CAD (coronary artery disease)    a. 2011: cath showing mod non-obstructive disease b. 02/2016: cath with 95% stenosis in the Mid RCA. DES placed.  . Cervical stenosis of spine   . Cocaine abuse    history  . Depression   . Diabetes mellitus without complication (HCC)   . Gout, unspecified   . Headache   . HTN (hypertension)   . Hypercholesterolemia   . Hyperglycemia   . Migraines   . Myocardial infarction 2008   drug cocaine and marijuana at time of mi none  since    . Pneumonia   . Renal insufficiency   . Stroke [facility])         Past Surgical History:  Procedure Laterality Date  .  ANTERIOR CERVICAL DECOMP/DISCECTOMY FUSION N/A 01/24/2017   Procedure: Cervical Four-Five,Cervical Five Six,Cervical Six-Seven Anterior cervical discectomy with fusion and plate with Cervical Three-Thoracic- One Laminectomy/Foraminotomy with Cervical Three-Thoracic- One Fixation and fusion;  Surgeon: Loura Halt Ditty, MD;  Location: [facility] OR;  Service: Neurosurgery;  Laterality: N/A;  Part-1 anterior approach  . CARDIAC CATHETERIZATION  2008; ~ 2011   [place]-40%lad  . CARDIAC CATHETERIZATION N/A 03/16/2016   Procedure: Left Heart Cath and Coronary Angiography;  Surgeon: Marykay Lex, MD;  Location: [facility] INVASIVE CV LAB;  Service: Cardiovascular;  Laterality: N/A;  . CORONARY ANGIOPLASTY     STENTS  . EYE SURGERY Bilateral    "for double vision"  . INGUINAL HERNIA REPAIR  05/12/2012   Procedure: HERNIA REPAIR INGUINAL ADULT;  Surgeon: Liz Malady, MD;  Location: [facility];  Service: General;  Laterality: Right;  . INGUINAL HERNIA REPAIR Left 02/02/2003   Dr. Violeta Gelinas. repair with mesh. Same procedure done again in  05/22/2004   . INGUINAL HERNIA REPAIR Left ?date 2nd OR  . POSTERIOR CERVICAL FUSION/FORAMINOTOMY N/A 01/24/2017   Procedure: Cervical Three-Thoracic One-Laminectomy/Foraminotomy with Cervical Three-Thoracic One Fixation and fusion;  Surgeon: Loura Halt Ditty, MD;  Location: [facility] OR;  Service: Neurosurgery;  Laterality: N/A;  Part-2 Posterior approach  . RECONSTRUCT / STABILIZE DISTAL ULNA    . SHOULDER OPEN ROTATOR CUFF REPAIR Left 2010  . SHOULDER SURGERY Left    "injured on the job; had to cut small bone out"  . THUMB FUSION  Right          Family History  Problem Relation Age of Onset  . Heart attack Mother     x7. Still alive  . Heart attack Father     deceased b/c of MI  . Heart attack Brother     older, deceased b/c of MI    Social History:  reports that he has never smoked. He has never used smokeless tobacco. He  reports that he drinks about 13.8 oz of alcohol per week . He reports that he uses drugs, including "Crack" cocaine, Marijuana, and Cocaine. Allergies:      Allergies  Allergen Reactions  . Shellfish Allergy Anaphylaxis         Medications Prior to Admission  Medication Sig Dispense Refill  . allopurinol (ZYLOPRIM) 300 MG tablet Take 1 tablet (300 mg total) by mouth daily. 30 tablet 6  . amLODipine (NORVASC) 5 MG tablet Take 1 tablet (5 mg total) by mouth daily. 30 tablet 11  . aspirin EC 81 MG EC tablet Take 1 tablet (81 mg total) by mouth daily.    Marland Kitchen CALCIUM-VITAMIN D PO Take 1 tablet by mouth daily.    . clotrimazole (LOTRIMIN) 1 % cream Apply 1 application topically daily as needed (athlete's foot).    . cyclobenzaprine (FLEXERIL) 10 MG tablet TAKE ONE TABLET BY MOUTH THREE TIMES DAILY AS NEEDED FOR MUSCLE SPASMS 30 tablet 0  . diphenhydrAMINE (BENADRYL) 25 MG tablet Take 25 mg by mouth every 6 (six) hours as needed for itching.    . gabapentin (NEURONTIN) 300 MG capsule Take 300 mg by mouth 3 (three) times daily.    . hydrochlorothiazide (HYDRODIURIL) 25 MG tablet Take 1 tablet (25 mg total) by mouth daily. 30 tablet 11  . HYDROcodone-acetaminophen (NORCO/VICODIN) 5-325 MG tablet Take 1-2 tablets by mouth every 4 (four) hours as needed for moderate pain or severe pain. 30 tablet 0  . losartan (COZAAR) 100 MG tablet Take 1 tablet (100 mg total) by mouth daily. 90 tablet 3  . meclizine (ANTIVERT) 25 MG tablet Take 1 tablet (25 mg total) by mouth 3 (three) times daily as needed. For vertigo 30 tablet 6  . Multiple Vitamin (MULTIVITAMIN WITH MINERALS) TABS tablet Take 1 tablet by mouth daily.    Bertram Gala Glycol-Propyl Glycol (SYSTANE OP) Apply 1 drop to eye 3 (three) times daily.    . rosuvastatin (CRESTOR) 40 MG tablet Take 1 tablet (40 mg total) by mouth daily. 30 tablet 11  . ticagrelor (BRILINTA) 90 MG TABS tablet Take 1 tablet (90 mg total) by mouth 2 (two) times  daily. 60 tablet 11  . nitroGLYCERIN (NITROSTAT) 0.4 MG SL tablet Place 1 tablet (0.4 mg total) under the tongue every 5 (five) minutes as needed for chest pain. 25 tablet 4    Home: Home Living Family/patient expects to be discharged to:: Private residence Living Arrangements: Parent, Other relatives (mother and brother) Available Help at Discharge: Family Type of Home: House Home Access: Stairs to enter Secretary/administrator of Steps: 1 Entrance Stairs-Rails: None Home Layout: One level Bathroom Shower/Tub: Engineer, manufacturing systems: Standard Bathroom Accessibility: Yes Home Equipment: Banker History: Prior Function Level of Independence: Independent Comments: Working full time as a Engineer, materials and reports that despite previous stroke, was not limited with L UE strength and functional use prior to surgery. Functional Status:  Mobility: Bed Mobility Overal bed mobility: Needs Assistance Bed Mobility: Sit to Sidelying, Sidelying to Sit, Rolling  Rolling: Min assist Sidelying to sit: Min assist Sit to sidelying: Min assist General bed mobility comments: Min assist to raise trunk and initiate movements. Transfers Overall transfer level: Needs assistance Equipment used: Rolling walker (2 wheeled) Transfers: Sit to/from Stand Sit to Stand: Min assist General transfer comment: Min steadying assist during power-up.  Ambulation/Gait Ambulation/Gait assistance: Min assist, Min guard Ambulation Distance (Feet): 160 Feet Assistive device: Rolling walker (2 wheeled) Gait Pattern/deviations: Step-through pattern, Decreased stride length, Trunk flexed, Narrow base of support General Gait Details: Pt required verbal cues for upright posture with ambulation. Min guard for safety, however, required minA to steady during turns due to R foot drag. Pt unaware of dragging R LE during turns.  Gait velocity: decreased Gait velocity interpretation: Below normal  speed for age/gender Stairs: Yes Stairs assistance: Min guard Stair Management: No rails, Step to pattern, Forwards Number of Stairs: 2 General stair comments: min guard for safety. verbal cues for technique of up with good, down with bad  ADL: ADL Overall ADL's : Needs assistance/impaired Eating/Feeding: Moderate assistance, Sitting Eating/Feeding Details (indicate cue type and reason): Supportive assistance for coordinating and lifting arm to bring cup to face. Grooming: Moderate assistance, Sitting Upper Body Bathing: Maximal assistance, Sitting Lower Body Bathing: Maximal assistance, Sit to/from stand Upper Body Dressing : Maximal assistance, Sitting Lower Body Dressing: Maximal assistance, Sit to/from stand Toilet Transfer: Minimal assistance, RW, Ambulation Toilet Transfer Details (indicate cue type and reason): Has the strength in triceps and grasp in order to utilize RW. Toileting- Clothing Manipulation and Hygiene: Minimal assistance, Sit to/from stand Functional mobility during ADLs: Minimal assistance, Rolling walker General ADL Comments: Pt required increased cues to coordinate use of RW. Max assist for dressing/bathing ADL due to limited active and purposeful movement of B LE with L more impacted than R.  Cognition: Cognition Overall Cognitive Status: No family/caregiver present to determine baseline cognitive functioning Orientation Level: Oriented X4 Cognition Arousal/Alertness: Lethargic Behavior During Therapy: Flat affect Overall Cognitive Status: No family/caregiver present to determine baseline cognitive functioning General Comments: Flat affect throughout and lethargic. Able to follow commands and problem solve but slowly.  Blood pressure 103/65, pulse 89, temperature 98.7 F (37.1 C), temperature source Oral, resp. rate 16, height 5\' 11"  (1.803 m), weight 89.8 kg (197 lb 15.6 oz), SpO2 91 %. Physical Exam  Vitals reviewed. Constitutional: He appears  well-developed and well-nourished.  HENT:  Head: Normocephalic and atraumatic.  Eyes: EOM are normal. Right eye exhibits no discharge. Left eye exhibits no discharge.  Neck:  C-collar  Cardiovascular: Normal rate and regular rhythm.   Respiratory: Effort normal and breath sounds normal.  GI: He exhibits distension. There is no tenderness.  Musculoskeletal: He exhibits no edema or tenderness.  Neurological: He is alert.  Sensation diminished to light touch in shoulders, hands, feet Hoarse voice Motor: B/l LE: 4/5  Proximal to distal B/l UE: Shoulder abduction 2/5, elbow flexion/extension 2+/5, hand 4/5 with ataxia  Skin:  Incision with dressing c/d/i  Psychiatric: He has a normal mood and affect. His behavior is normal.    Lab Results Last 24 Hours       Results for orders placed or performed during the hospital encounter of 01/24/17 (from the past 24 hour(s))  CBC     Status: Abnormal   Collection Time: 01/26/17  9:28 AM  Result Value Ref Range   WBC 13.6 (H) 4.0 - 10.5 K/uL   RBC 3.32 (L) 4.22 - 5.81 MIL/uL   Hemoglobin 9.6 (L)  13.0 - 17.0 g/dL   HCT 69.6 (L) 29.5 - 28.4 %   MCV 88.6 78.0 - 100.0 fL   MCH 28.9 26.0 - 34.0 pg   MCHC 32.7 30.0 - 36.0 g/dL   RDW 13.2 44.0 - 10.2 %   Platelets 146 (L) 150 - 400 K/uL  Basic metabolic panel     Status: Abnormal   Collection Time: 01/26/17  9:28 AM  Result Value Ref Range   Sodium 135 135 - 145 mmol/L   Potassium 4.0 3.5 - 5.1 mmol/L   Chloride 100 (L) 101 - 111 mmol/L   CO2 25 22 - 32 mmol/L   Glucose, Bld 117 (H) 65 - 99 mg/dL   BUN 19 6 - 20 mg/dL   Creatinine, Ser 7.25 (H) 0.61 - 1.24 mg/dL   Calcium 8.0 (L) 8.9 - 10.3 mg/dL   GFR calc non Af Amer 50 (L) >60 mL/min   GFR calc Af Amer 58 (L) >60 mL/min   Anion gap 10 5 - 15  Glucose, capillary     Status: None   Collection Time: 01/26/17 11:12 AM  Result Value Ref Range   Glucose-Capillary 86 65 - 99 mg/dL  Glucose, capillary     Status:  None   Collection Time: 01/26/17  4:08 PM  Result Value Ref Range   Glucose-Capillary 90 65 - 99 mg/dL  Glucose, capillary     Status: Abnormal   Collection Time: 01/26/17 10:00 PM  Result Value Ref Range   Glucose-Capillary 101 (H) 65 - 99 mg/dL   Comment 1 Notify RN    Comment 2 Document in Chart   Glucose, capillary     Status: Abnormal   Collection Time: 01/27/17  7:11 AM  Result Value Ref Range   Glucose-Capillary 108 (H) 65 - 99 mg/dL   Comment 1 Notify RN    Comment 2 Document in Chart      Imaging Results (Last 48 hours)  No results found.    Assessment/Plan: Diagnosis: Cervical spondylosis with radiculopathy Labs and images independently reviewed.  Records reviewed and summated above.  1. Does the need for close, 24 hr/day medical supervision in concert with the patient's rehab needs make it unreasonable for this patient to be served in a less intensive setting? Yes 2. Co-Morbidities requiring supervision/potential complications: postop pain (Biofeedback training with therapies to help reduce reliance on opiate pain medications, monitor pain control during therapies, and sedation at rest and titrate to maximum efficacy to ensure participation and gains in therapies), leukocytosis (cont to monitor for signs and symptoms of infection, further workup if indicated), Thrombocytopenia (< 60,000/mm3 no resistive exercise), ABLA (transfuse if necessary to ensure appropriate perfusion for increased activity tolerance), arthritis, CAD status post MI (cont meds), cocaine abuse (counsel), depression (ensure mood does not hinder progress of therapies), Prediabetes (Monitor in accordance with exercise and adjust meds as necessary), HTN (monitor and provide prns in accordance with increased physical exertion and pain), CKD (avoid nephrotoxic meds), CVA 3. Due to bladder management, bowel management, safety, skin/wound care, disease management, pain management and patient  education, does the patient require 24 hr/day rehab nursing? Yes 4. Does the patient require coordinated care of a physician, rehab nurse, PT (1-2 hrs/day, 5 days/week), OT (1-2 hrs/day, 5 days/week) and SLP (1-2 hrs/day, 5 days/week) to address physical and functional deficits in the context of the above medical diagnosis(es)? Yes Addressing deficits in the following areas: balance, endurance, locomotion, strength, transferring, bowel/bladder control, bathing, dressing, feeding, toileting,  speech, swallowing and psychosocial support 5. Can the patient actively participate in an intensive therapy program of at least 3 hrs of therapy per day at least 5 days per week? Potentially 6. The potential for patient to make measurable gains while on inpatient rehab is excellent 7. Anticipated functional outcomes upon discharge from inpatient rehab are supervision and min assist  with PT, supervision and min assist with OT, independent and modified independent with SLP. 8. Estimated rehab length of stay to reach the above functional goals is: 16-19 days. 9. Does the patient have adequate social supports and living environment to accommodate these discharge functional goals? Yes 10. Anticipated D/C setting: Home 11. Anticipated post D/C treatments: HH therapy and Home excercise program 12. Overall Rehab/Functional Prognosis: good  RECOMMENDATIONS: This patient's condition is appropriate for continued rehabilitative care in the following setting: CIR when demonstrates ability to tolerate 3 hours therapy/day. Patient has agreed to participate in recommended program. Yes Note that insurance prior authorization may be required for reimbursement for recommended care.  Comment: Rehab Admissions Coordinator to follow up.  Maryla Morrow, MD, [place] [facility] 01/27/2017     Routing History

## 2017-01-29 NOTE — Evaluation (Addendum)
Occupational Therapy Assessment and Plan and OT Intervention  Patient Details  Name: Russell Carter MRN: [id number] Date of Birth: 06/17/1956  OT Diagnosis: abnormal posture, muscle weakness (generalized) and pain in neck , coordination disorder Rehab Potential: Rehab Potential (ACUTE ONLY): Fair ELOS: 14 days   Today's Date: 01/29/2017 OT Individual Time: 0900 -1000 and 1520-1602 OT Individual Time Calculation (min): 60 min and 42 min     Problem List:  Patient Active Problem List   Diagnosis Date Noted  . Debility   . Post-operative pain   . Radicular pain   . Diabetes mellitus type 2 in nonobese (HCC)   . Surgery, elective   . Postoperative pain   . Leukocytosis   . Thrombocytopenia (HCC)   . Acute blood loss anemia   . History of myocardial infarction   . Cocaine abuse   . Depression   . Type 2 diabetes mellitus with hyperglycemia, without long-term current use of insulin (HCC)   . Stage 3 chronic kidney disease   . Benign essential HTN   . History of CVA (cerebrovascular accident)   . Prediabetes   . Cervical spondylosis with radiculopathy 01/24/2017  . Dyslipidemia 03/15/2016  . Unstable angina (HCC) - chest pain with complete heart block & chest pain 03/15/2016  . Complete heart block (HCC) 03/14/2016  . Cervical spinal stenosis 03/14/2016  . Chest pain with moderate risk of acute coronary syndrome 03/13/2016  . CAD-50% LAD 2011 12/14/2014  . Gout 11/16/2014  . Headache 07/15/2013  . Polysubstance abuse 01/15/2013  . DIZZINESS 06/05/2010  . Essential hypertension 05/18/2009  . Acute myocardial infarction 05/18/2009  . MUSCLE SPASM 05/18/2009    Past Medical History:  Past Medical History:  Diagnosis Date  . Anginal pain (HCC)   . Arthritis   . Blood in stool   . CAD (coronary artery disease)    a. 2011: cath showing mod non-obstructive disease b. 02/2016: cath with 95% stenosis in the Mid RCA. DES placed.  . Cervical stenosis of spine   . Cocaine abuse     history  . Depression   . Diabetes mellitus without complication (HCC)   . Gout, unspecified   . Headache   . HTN (hypertension)   . Hypercholesterolemia   . Hyperglycemia   . Migraines   . Myocardial infarction 2008   drug cocaine and marijuana at time of mi none  since    . Pneumonia   . Renal insufficiency   . Stroke (HCC)    Past Surgical History:  Past Surgical History:  Procedure Laterality Date  . ANTERIOR CERVICAL DECOMP/DISCECTOMY FUSION N/A 01/24/2017   Procedure: Cervical Four-Five,Cervical Five Six,Cervical Six-Seven Anterior cervical discectomy with fusion and plate with Cervical Three-Thoracic- One Laminectomy/Foraminotomy with Cervical Three-Thoracic- One Fixation and fusion;  Surgeon: Benjamin Jared Ditty, MD;  Location: [facility] OR;  Service: Neurosurgery;  Laterality: N/A;  Part-1 anterior approach  . CARDIAC CATHETERIZATION  2008; ~ 2011   [place]-40%lad  . CARDIAC CATHETERIZATION N/A 03/16/2016   Procedure: Left Heart Cath and Coronary Angiography;  Surgeon: David W Harding, MD;  Location: [facility];  Service: Cardiovascular;  Laterality: N/A;  . CORONARY ANGIOPLASTY     STENTS  . EYE SURGERY Bilateral    "for double vision"  . INGUINAL HERNIA REPAIR  05/12/2012   Procedure: HERNIA REPAIR INGUINAL ADULT;  Surgeon: Burke E Thompson, MD;  Location: [facility];  Service: General;  Laterality: Right;  . INGUINAL HERNIA REPAIR Left 02/02/2003     Dr. Georganna Skeans. repair with mesh. Same procedure done again in  05/22/2004   . INGUINAL HERNIA REPAIR Left ?date 2nd OR  . POSTERIOR CERVICAL FUSION/FORAMINOTOMY N/A 01/24/2017   Procedure: Cervical Three-Thoracic One-Laminectomy/Foraminotomy with Cervical Three-Thoracic One Fixation and fusion;  Surgeon: Kevan Ny Ditty, MD;  Location: [facility];  Service: Neurosurgery;  Laterality: N/A;  Part-2 Posterior approach  . RECONSTRUCT / STABILIZE DISTAL ULNA    . SHOULDER OPEN ROTATOR CUFF REPAIR Left 2010  .  SHOULDER SURGERY Left    "injured on the job; had to cut small bone out"  . THUMB FUSION Right     Assessment & Plan Clinical Impression: Patient is a 61 y.o. year old male with pmh of arthritis, CAD status post MI maintained on aspirin and Brilinta, cervical stenosis, cocaine and alcohol abuse, depression, gout, diabetes, HTN, CKD stage III, CVA with little residual weakness. Per chart review patient lives with mother and brother. Independent working as a Animal nutritionist. One level home with one-step entry. Presented on 4/5 due to cervical spondylosis with radiculopathy. He had associated severe pain in neck and shoulder. He underwent C4-7 anterior discectomy with fusion, posterior cervical fusion C3-C6 and laminectomy C3-T1 per Dr. Cyndy Freeze. Cervical collar at all times. Decadron protocol as advised. Hospital course complicatedby postop pain with OxyContin sustained release added, leukocytosis 13,600, thrombocytopenia 146,000, ABLA 9.6 and monitored. Physical and occupational therapy evaluations completed with recommendations of physical medicine rehabilitation consult. Patient was admitted for a comprehensive rehabilitation program.Patient transferred to CIR on 01/28/2017 .    Patient currently requires min - max A with basic self-care skills secondary to muscle weakness, decreased cardiorespiratoy endurance and decreased standing balance and decreased balance strategies.  Prior to hospitalization, patient could complete ADls and IADLs with independent .  Patient will benefit from skilled intervention to increase independence with basic self-care skills prior to discharge home with care partner.  Anticipate patient will require intermittent supervision and follow up home health.  OT - End of Session Activity Tolerance: Decreased this session Endurance Deficit: Yes Endurance Deficit Description: requires seated rest breaks following short duration mobility tasks OT Assessment Rehab Potential (ACUTE  ONLY): Fair OT Patient demonstrates impairments in the following area(s): Balance;Endurance;Motor;Pain;Safety;Sensory OT Basic ADL's Functional Problem(s): Eating;Grooming;Bathing;Dressing;Toileting OT Advanced ADL's Functional Problem(s): Simple Meal Preparation OT Transfers Functional Problem(s): Toilet;Tub/Shower OT Additional Impairment(s): Fuctional Use of Upper Extremity OT Plan OT Intensity: Minimum of 1-2 x/day, 45 to 90 minutes OT Frequency: 5 out of 7 days OT Duration/Estimated Length of Stay: 14 days OT Treatment/Interventions: Balance/vestibular training;Neuromuscular re-education;Self Care/advanced ADL retraining;Therapeutic Exercise;UE/LE Strength taining/ROM;Pain management;DME/adaptive equipment instruction;Community reintegration;Patient/family education;UE/LE Coordination activities;Discharge planning;Functional mobility training;Psychosocial support;Therapeutic Activities OT Self Feeding Anticipated Outcome(s): set up OT Basic Self-Care Anticipated Outcome(s): mod I - supervision OT Toileting Anticipated Outcome(s): supervision OT Bathroom Transfers Anticipated Outcome(s): mod I toilet transfer OT Recommendation Recommendations for Other Services: Neuropsych consult Patient destination: Home Follow Up Recommendations: 24 hour supervision/assistance;Outpatient OT Equipment Recommended: To be determined   Skilled Therapeutic Intervention Session 1: Upon entering the room, pt seated in wheelchair awaiting therapist arrival. Pt reports 10/10 pain in neck. RN notified and medication given during this session. Pt reporting need for BM and therapist assisted pt into bathroom via wheelchair. Min A stand pivot transfer onto elevated toilet. Pt unable to wipe self secondary to pain with task. Pt seated in wheelchair for bathing and dressing task which he needed increased assistance secondary to UE limitations secondary to pain. OT discussed possible AE to try  next session. OT educated  pt on OT purpose, POC, and goals with pt verbalizing and understanding agreement. Pt remained in wheelchair at end of session awaiting SLP. Call bell and all needed items within reach.   Session 2: Upon entering the room, pt sleeping soundly but agreeable to OT intervention. Pt reports 4/10 pain in neck this session but states he has received pain medication prior. Pt has not ate lunch and is requiring assistance and problem solving for task. Pt performed supine >sit with supervision and min verbal cues for technique. OT raised bed slightly and adjusted tray table for pt to place R elbow comfortable and pt able to pick up sandwich on place with increased time and cues for technique. Pt able to bring food to mouth with elbow resting on table to decrease demand. Pt reports position also keeps pain from increase with functional task. Pt able to pick up cup and bring to mouth as well. Pt remained seated on the EOB with tray in front of him and RN called per pt request. All needs within reach.   OT Evaluation Precautions/Restrictions  Precautions Precautions: Cervical;Fall Required Braces or Orthoses: Cervical Brace Cervical Brace: Hard collar Restrictions Weight Bearing Restrictions: No General PT Missed Treatment Reason: Patient fatigue Vital Signs Therapy Vitals Temp: 98 F (36.7 C) Temp Source: Oral Pulse Rate: 60 Resp: 16 BP: 126/65 Patient Position (if appropriate): Lying Oxygen Therapy SpO2: 100 % Pain Pain Assessment Pain Score: 10-Worst pain ever Pain Type: Acute pain Pain Location: Neck Pain Orientation: Anterior;Posterior Pain Descriptors / Indicators: Aching Pain Onset: On-going Patients Stated Pain Goal: 2 Pain Intervention(s): RN made aware;Medication (See eMAR);Repositioned Home Living/Prior Functioning Home Living Available Help at Discharge: Family, Available 24 hours/day Type of Home: House Home Access: Stairs to enter Entrance Stairs-Number of Steps: 1 Entrance  Stairs-Rails: None Home Layout: One level Bathroom Shower/Tub: Tub/shower unit, Curtain Bathroom Toilet: Standard Bathroom Accessibility: Yes  Lives With: Family Prior Function Level of Independence: Independent with gait, Independent with basic ADLs, Independent with homemaking with ambulation, Independent with transfers  Able to Take Stairs?: Yes Driving: Yes Vocation: Full time employment Comments: works part time as security guard, 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 Vision/Perception  Vision- History Baseline Vision/History: No visual deficits Patient Visual Report: No change from baseline Perception Comments: WFL  Cognition Overall Cognitive Status: Within Functional Limits for tasks assessed Arousal/Alertness: Awake/alert Orientation Level: Person;Place;Situation Person: Oriented Place: Oriented Situation: Oriented Year: 2018 Month: April Day of Week: Correct Memory: Appears intact Immediate Memory Recall: Sock;Blue;Bed Memory Recall: Sock;Blue;Bed Memory Recall Sock: Without Cue Memory Recall Blue: Without Cue Memory Recall Bed: With Cue Attention: Sustained Sustained Attention: Appears intact Awareness: Appears intact Problem Solving: Appears intact Safety/Judgment: Appears intact Sensation Sensation Light Touch: Appears Intact Proprioception: Appears Intact Coordination Gross Motor Movements are Fluid and Coordinated: No Fine Motor Movements are Fluid and Coordinated: No Heel Shin Test: WFL Motor  Motor Motor: Paraplegia Motor - Skilled Clinical Observations: BUE c5 radiculopathy, L more affected than R Mobility  Bed Mobility Bed Mobility: Supine to Sit;Sit to Supine Supine to Sit: 5: Supervision Supine to Sit Details: Verbal cues for technique;Verbal cues for precautions/safety Sit to Supine: 5: Supervision Sit to Supine - Details: Verbal cues for technique;Verbal cues for precautions/safety Transfers Sit to Stand: 4: Min guard;Without upper extremity  assist  Trunk/Postural Assessment  Cervical Assessment Cervical Assessment: Exceptions to WFL (aspen collar) Thoracic Assessment Thoracic Assessment: Within Functional Limits Lumbar Assessment Lumbar Assessment: Within Functional Limits Postural Control   Postural Control: Deficits on evaluation  Balance Balance Balance Assessed: Yes Static Sitting Balance Static Sitting - Balance Support: Feet supported;No upper extremity supported Static Sitting - Level of Assistance: 6: Modified independent (Device/Increase time) Dynamic Sitting Balance Dynamic Sitting - Balance Support: Feet supported;No upper extremity supported Dynamic Sitting - Level of Assistance: 5: Stand by assistance Dynamic Sitting - Balance Activities: Lateral lean/weight shifting;Forward lean/weight shifting Static Standing Balance Static Standing - Balance Support: No upper extremity supported Static Standing - Level of Assistance: 5: Stand by assistance Dynamic Standing Balance Dynamic Standing - Balance Support: No upper extremity supported Dynamic Standing - Level of Assistance: 4: Min assist Dynamic Standing - Balance Activities: Lateral lean/weight shifting;Forward lean/weight shifting Extremity/Trunk Assessment RUE Assessment RUE Assessment: Exceptions to WFL (Not formally tested secondary to pain. Limited AROM in shoulder and elbow. Able to raise to 90 degrees for functional tasks.) LUE Assessment LUE Assessment: Exceptions to WFL (impaired. L UE with residual deficits from prior CVA but pt reports increased weakness. Not formally tested secondary to 10/10 pain)   See Function Navigator for Current Functional Status.   Refer to Care Plan for Long Term Goals  Recommendations for other services: Neuropsych   Discharge Criteria: Patient will be discharged from OT if patient refuses treatment 3 consecutive times without medical reason, if treatment goals not met, if there is a change in medical status, if  patient makes no progress towards goals or if patient is discharged from hospital.  The above assessment, treatment plan, treatment alternatives and goals were discussed and mutually agreed upon: by patient  ,  P 01/29/2017, 4:51 PM  

## 2017-01-29 NOTE — Progress Notes (Signed)
Physical Therapy Note  Patient Details  Name: Russell Carter MRN: [id number] Date of Birth: 09-03-1956 Today's Date: 01/29/2017   Pt missed 30 min PT due to fatigue; pt sleeping heavily and snoring loudly when therapist entered room. Therapist attempted to wake patient several times, pt opened eyes briefly and then quickly returned to sleep. Continue per POC as pt tolerates.  Carolynn Comment Tygielski 01/29/2017, 3:49 PM

## 2017-01-29 NOTE — Patient Care Conference (Signed)
Inpatient RehabilitationTeam Conference and Plan of Care Update Date: 01/30/2017   Time: 2:00 PM    Patient Name: Russell Carter      Medical Record Number: [id number]  Date of Birth: April 02, 1956 Sex: Male         Room/Bed: 4W10C/4W10C-01 Payor Info: Payor: MEDICAID [place] / Plan: MEDICAID [place] ACCESS / Product Type: *No Product type* /    Admitting Diagnosis: cervical fusion  Admit Date/Time:  01/28/2017  3:40 PM Admission Comments: No comment available   Primary Diagnosis:  Cervical spondylosis with radiculopathy Principal Problem: Cervical spondylosis with radiculopathy  Patient Active Problem List   Diagnosis Date Noted  . Debility   . Post-operative pain   . Radicular pain   . Diabetes mellitus type 2 in nonobese (HCC)   . Surgery, elective   . Postoperative pain   . Leukocytosis   . Thrombocytopenia (HCC)   . Acute blood loss anemia   . History of myocardial infarction   . Cocaine abuse   . Depression   . Type 2 diabetes mellitus with hyperglycemia, without long-term current use of insulin (HCC)   . Stage 3 chronic kidney disease   . Benign essential HTN   . History of CVA (cerebrovascular accident)   . Prediabetes   . Cervical spondylosis with radiculopathy 01/24/2017  . Dyslipidemia 03/15/2016  . Unstable angina (HCC) - chest pain with complete heart block & chest pain 03/15/2016  . Complete heart block (HCC) 03/14/2016  . Cervical spinal stenosis 03/14/2016  . Chest pain with moderate risk of acute coronary syndrome 03/13/2016  . CAD-50% LAD 2011 12/14/2014  . Gout 11/16/2014  . Headache 07/15/2013  . Polysubstance abuse 01/15/2013  . DIZZINESS 06/05/2010  . Essential hypertension 05/18/2009  . Acute myocardial infarction 05/18/2009  . MUSCLE SPASM 05/18/2009    Expected Discharge Date: Expected Discharge Date: 02/12/17  Team Members Present: Physician leading conference: Dr. Ferman Hamming, PsyD Social Worker Present: Staci Acosta,  LCSW Nurse Present: Carmie End, RN PT Present: Alyson Reedy, PT OT Present: Ardis Rowan, COTA;Voncile Schwarz Katrinka Blazing, OT SLP Present: Reuel Derby, SLP PPS Coordinator present : Tora Duck, RN, CRRN     Current Status/Progress Goal Weekly Team Focus  Medical   cervical decompression and fusion for radiculopathy and stenosis, pain issues, ongoing weakness in ue's  improve pain control and activity tolerance  pain mgt, bp control, bladde/rbowel mgt   Bowel/Bladder   Continent of BB with urgency, uses condom cath at night.  To remain contine tof BB and stop using condom cath  Timed toileting   Swallow/Nutrition/ Hydration             ADL's   min A functional transfers, self care mod - max A secondary to limitations of use of B UEs secondary to pain  mod I - min A  AE for self care retraining, functional transfers, pt education, activity tolerance   Mobility   S bed mobility, min guard/minA transfers and gait with RW; limited by BUE NM deficits  modI bed mobility/transfers, S gait in home, community and stairs  LE strength, coordination, activity tolerance, BUE NMR   Communication             Safety/Cognition/ Behavioral Observations            Pain   Normally complain of neck pain, taking oxy 10 mg PRN and seems effective  <2  assess and treat pain q shift and as needed   Skin   Surgical wound  on the neck with honeycomb dressing.  skin to be free of infection/breakdown  assess and address any skin issues q shift and as needed    Rehab Goals Patient on target to meet rehab goals: Yes Rehab Goals Revised: none - pt's first conference *See Care Plan and progress notes for long and short-term goals.  Barriers to Discharge: pain, anxiety    Possible Resolutions to Barriers:  NMR, adaptive techniques, pt education    Discharge Planning/Teaching Needs:  Pt to return to his mother's home where he lives with her and his brother.  Per admissions coordinator, pt's brother can assist  pt as needed.  CSW to confirm this when CSW meets with pt.  [name] can come for family education closer to pt's d/c.   Team Discussion:  Pt with decompression and fusion for cervical stenosis and radiculopathy.  Pt with hx of old CVA.  This admission, his upper extremities are more affected than lower.  Pt with some anxiety and 10/10 pain.  This pain limited him in OT evaluation today and OT felt he may be able to do more if he didn't have so much pain.  Pt walked 120' with PT at min guard and is mainly limited by his upper extremities.  No SLP was ordered.    Revisions to Treatment Plan:  none   Continued Need for Acute Rehabilitation Level of Care: The patient requires daily medical management by a physician with specialized training in physical medicine and rehabilitation for the following conditions: Daily direction of a multidisciplinary physical rehabilitation program to ensure safe treatment while eliciting the highest outcome that is of practical value to the patient.: Yes Daily medical management of patient stability for increased activity during participation in an intensive rehabilitation regime.: Yes Daily analysis of laboratory values and/or radiology reports with any subsequent need for medication adjustment of medical intervention for : Post surgical problems  Hasten Sweitzer, Vista Deck 01/30/2017, 12:51 PM

## 2017-01-30 ENCOUNTER — Inpatient Hospital Stay (HOSPITAL_COMMUNITY): Payer: Medicaid Other | Admitting: Speech Pathology

## 2017-01-30 ENCOUNTER — Inpatient Hospital Stay (HOSPITAL_COMMUNITY): Payer: Medicaid Other | Admitting: Physical Therapy

## 2017-01-30 ENCOUNTER — Inpatient Hospital Stay (HOSPITAL_COMMUNITY): Payer: Medicaid Other

## 2017-01-30 ENCOUNTER — Encounter (HOSPITAL_COMMUNITY): Payer: Medicaid Other | Admitting: Psychology

## 2017-01-30 DIAGNOSIS — G8918 Other acute postprocedural pain: Secondary | ICD-10-CM

## 2017-01-30 DIAGNOSIS — M4722 Other spondylosis with radiculopathy, cervical region: Principal | ICD-10-CM

## 2017-01-30 DIAGNOSIS — I10 Essential (primary) hypertension: Secondary | ICD-10-CM

## 2017-01-30 DIAGNOSIS — E1165 Type 2 diabetes mellitus with hyperglycemia: Secondary | ICD-10-CM

## 2017-01-30 MED FILL — Thrombin For Soln 5000 Unit: CUTANEOUS | Qty: 5000 | Status: AC

## 2017-01-30 NOTE — Progress Notes (Signed)
Physical Therapy Session Note  Patient Details  Name: Russell Carter MRN: [id number] Date of Birth: 11-03-1955  Today's Date: 01/30/2017 PT Individual Time: 1000-1110 and 1545-1615 PT Individual Time Calculation (min): 70 min and 30 min (total 100 min)   Short Term Goals: Week 1:  PT Short Term Goal 1 (Week 1): Pt will ambulate 150' RW and min guard PT Short Term Goal 2 (Week 1): Pt will ascend/descent 1 flight stairs 6" height with 1 handrail min guard PT Short Term Goal 3 (Week 1): Pt will demonstrate dynamic standing balance min guard x10 min  Skilled Therapeutic Interventions/Progress Updates: Tx 1: Pt received seated in w/c, c/o pain 2/10 in neck and agreeable to treatment however does report he is extremely fatigued. Seated LE exercises 3 sets 15 reps each including hip adduction isometric, hip flexion marching, long arc quad. Provided pt with handout for exercises to allow performance independently in room. Gait to gym with RW and S; slow speed initially however able to speed up with cueing, with pt reporting "I just figured you wanted me to go slow". Educated pt regarding correlation between gait speed and fall risk, and that self-selected normal gait speed is preferred. Performed Nustep x10 min with BUE/BLE on level 4 with average 40 steps/min for strengthening and aerobic endurance. BUE ranger for shoulder stability and shoulder abduction/adduction AROM. Eccentric shoulder adduction at 90 degrees with short lever arm; unable to assist with maintaining position until final 10-15 degrees, likely indicative of supraspinatus compensation and minimal/no assist from deltoid. Gait to return to room with S. Sit >supine with S, required assist for managing covers. Remained supine in bed, alarm intact and all needs in reach at end of session.   Tx 2: Pt received asleep in bed, easily awoken and agreeable to treatment. Reports pain 10/10, pre-medicated. Gait to gym with RW and S. Soft tissue massage to  middle traps/rhomboids bilaterally; several trigger points identified as we as general hypertonicity. Supine lying on tennis balls co-banded together, placed at mid traps/rhomboids; pt reports significant pain reduction following several minutes lying on tennis balls to 3/10. Gait to return to room as above. Remained seated on toilet at end of session with instruction to use call bell when finished and not get up alone; pt verbalizes understanding.      Therapy Documentation Precautions:  Precautions Precautions: Cervical, Fall Required Braces or Orthoses: Cervical Brace Cervical Brace: Hard collar Restrictions Weight Bearing Restrictions: No Pain: Pain Assessment Pain Assessment: 0-10 Pain Score: 2  Pain Type: Acute pain Pain Location: Neck Pain Orientation: Anterior;Posterior Pain Descriptors / Indicators: Aching Pain Frequency: Intermittent Pain Onset: On-going Patients Stated Pain Goal: 2 Pain Intervention(s): Repositioned Multiple Pain Sites: No   See Function Navigator for Current Functional Status.   Therapy/Group: Individual Therapy  Vista Lawman 01/30/2017, 11:12 AM

## 2017-01-30 NOTE — Consult Note (Signed)
Neuropsychological Consultation   Patient:   Russell Carter   DOB:   [birth year]  MR Number:  [id number]  Location:  [name] [address] [id number] [place] [address] Dept: [phone] Loc: [phone]           Date of Service:   01/30/2017  Start Time:   12:45 PM End Time:   1:30 PM  Provider/Observer:  Arley Phenix, Psy.D.       Clinical Neuropsychologist       Billing Code/Service: [phone] 4 Units  Chief Complaint:    The patient describes issues related to anxiety and worry particularly postoperatively for fear of falling or other issues that would complicate his recovery.  Reason for Service:  The patient was referred for neuropsychological consultation due to issues of anxiety and worry particularly around rehabilitative efforts. The patient recently had cervical spine fusion and was referred for rehabilitative services during the initial recovery phase. The patient acknowledges being very hesitant about some the aspects of rehabilitation for fear that he might fall and being very tentative and worried.  Current Status:  The patient reports that he has done will bit better today after assurances from physical therapy that they will do what they need to do to protect him while he is doing his physical therapy. The patient is having some adjustment due to post surgical/operative pain and difficulties.  Reliability of Information: Information is obtained through review of medical records as well as consultation with treatment team. 45 minute face-to-face interaction with the patient including clinical interview.  Behavioral Observation: Russell Carter  presents as a 61 y.o.-year-old Right African American Male who appeared his stated age. his dress was Appropriate and he was Well Groomed and his manners were Appropriate to the situation.  his participation was indicative of Appropriate and Drowsy  behaviors.  There were any physical disabilities noted.  he displayed an appropriate level of cooperation and motivation.     Interactions:    Active Appropriate  Attention:   The patient did have some mild issues with attention and concentration but these were likely related to current pain levels and pain medication. and attention span appeared shorter than expected for age  Memory:   within normal limits; recent and remote memory intact  Visuo-spatial:  within normal limits  Speech (Volume):  low  Speech:   normal;   Thought Process:  Coherent  Though Content:  WNL; not suicidal  Orientation:   person, place, time/date and situation  Judgment:   Fair  Planning:   Fair  Affect:    Anxious  Mood:    Anxious  Insight:   Fair  Intelligence:   normal  Substance Use:  There is a documented history of cocaine and marijuana abuse confirmed by the patient and Medical records..  The patient denies any recent use any cocaine use was in the past and no cocaine use any time in the recent past.  Medical History:   Past Medical History:  Diagnosis Date  . Anginal pain (HCC)   . Arthritis   . Blood in stool   . CAD (coronary artery disease)    a. 2011: cath showing mod non-obstructive disease b. 02/2016: cath with 95% stenosis in the Mid RCA. DES placed.  . Cervical stenosis of spine   . Cocaine abuse    history  . Depression   . Diabetes mellitus without complication (HCC)   . Gout, unspecified   .  Headache   . HTN (hypertension)   . Hypercholesterolemia   . Hyperglycemia   . Migraines   . Myocardial infarction 2008   drug cocaine and marijuana at time of mi none  since    . Pneumonia   . Renal insufficiency   . Stroke Russell Carter)        Family Med/Psych History:  Family History  Problem Relation Age of Onset  . Heart attack Mother     x7. Still alive  . Heart attack Father     deceased b/c of MI  . Heart attack Brother     older, deceased b/c of MI     Risk of  Suicide/Violence: virtually non-existent the patient denies any suicidal or homicidal ideation.  Impression/DX:  The patient is a 61 year old male who was referred for rehabilitation services for recovery postoperatively from cervical fusion. The patient was having difficulty initially with issues related to anxiety and worry. The patient acknowledges today that he was very apprehensive and worried that he may fall or something might happen when doing physical therapy that would injure him further cause complications after his surgery. The patient reports that these worries were intrusive and that he has been very careful. He has insisted on having nursing staff help him do things like sit up in the bed or even lie back in the bed. When he was doing his walking initially he was extremely cautious and acknowledges that this was due to anxiety. However, the patient reports that he is beginning to feel more comfortable if we worked on coping and reassuring that the physical therapist that are working with him are well trained and know what to do to ensure his safety but at the same time we need to keep him moving in a way that will help his continued and further recovery.  Disposition/Plan:  While we won't schedule definitively another visit with the patient will remain available and instructed the patient that if his anxiety or worry were to begin to increase again to let staff know so he and I can get together again and work on those issues.          Electronically Signed   _______________________ Arley Phenix, Psy.D.

## 2017-01-30 NOTE — Progress Notes (Signed)
[facility]patient Rehabilitation Center Individual Statement of Services  Patient Name:  Russell Carter  Date:  01/30/2017  Welcome to the Inpatient Rehabilitation Center.  Our goal is to provide you with an individualized program based on your diagnosis and situation, designed to meet your specific needs.  With this comprehensive rehabilitation program, you will be expected to participate in at least 3 hours of rehabilitation therapies Monday-Friday, with modified therapy programming on the weekends.  Your rehabilitation program will include the following services:  Physical Therapy (PT), Occupational Therapy (OT), 24 hour per day rehabilitation nursing, Neuropsychology, Case Management (Social Worker), Rehabilitation Medicine, Nutrition Services and Pharmacy Services  Weekly team conferences will be held on Tuesdays to discuss your progress.  Your Social Worker will talk with you frequently to get your input and to update you on team discussions.  Team conferences with you and your family in attendance may also be held.  Expected length of stay:  2 weeks  Overall anticipated outcome:  Supervision  Depending on your progress and recovery, your program may change. Your Social Worker will coordinate services and will keep you informed of any changes. Your Social Worker's name and contact numbers are listed  below.  The following services may also be recommended but are not provided by the Inpatient Rehabilitation Center:   Driving Evaluations  Home Health Rehabiltiation Services  Outpatient Rehabilitation Services  Vocational Rehabilitation   Arrangements will be made to provide these services after discharge if needed.  Arrangements include referral to agencies that provide these services.  Your insurance has been verified to be:  Medicaid Your primary doctor is:  [facility]  Pertinent information will be shared with your doctor and your insurance company.  Social Worker:  Staci Acosta, LCSW  [phone] or [phone]  Information discussed with and copy given to patient by: Elvera Lennox, 01/30/2017, 12:53 PM

## 2017-01-30 NOTE — Progress Notes (Signed)
Social Work Patient ID: Russell Carter, male   DOB: 08-Jun-1956, 61 y.o.   MRN: [id number]   CSW met with pt to update him on team conference discussion and targeted d/c date of 02-12-17.  Pt was pleased to hear of that date and will continue to work with therapies.  CSW remains available to assist as needed.

## 2017-01-30 NOTE — Progress Notes (Signed)
[facility]     PROGRESS NOTE    Subjective/Complaints: Persistent pain issues. Perhaps slept a little better last night  ROS: pt denies nausea, vomiting, diarrhea, cough, shortness of breath or chest pain   Objective: Vital Signs: Blood pressure (!) 143/90, pulse 68, temperature 97.7 F (36.5 C), temperature source Oral, resp. rate 16, height 5\' 11"  (1.803 m), weight 83.7 kg (184 lb 8.4 oz), SpO2 100 %. No results found.  Recent Labs  01/29/17 0726  WBC 14.6*  HGB 9.1*  HCT 28.2*  PLT 303    Recent Labs  01/29/17 0726  NA 138  K 4.3  CL 102  GLUCOSE 130*  BUN 46*  CREATININE 1.62*  CALCIUM 8.6*   CBG (last 3)  No results for input(s): GLUCAP in the last 72 hours.  Wt Readings from Last 3 Encounters:  01/30/17 83.7 kg (184 lb 8.4 oz)  01/24/17 89.8 kg (197 lb 15.6 oz)  01/21/17 90 kg (198 lb 8 oz)    Physical Exam:  Constitutional: He appears well-developed and well-nourished.  HENT:  Head: Normocephalic and atraumatic.  Eyes: EOMI.  Neck:  Cervical collar in place and fitting Cardiovascular: RRR  Respiratory: CTA B GI: Soft. Bowel sounds are normal. He exhibits no distension.  Musculoskeletal: He exhibits edema. He exhibits no tenderness.  Neurological: He is alert.  Sensation diminished to light touch in shoulders, hands, feet---inconsistent Hoarse voice but quite intelligible.  Motor: B/l LE: 4-4+/5 proximal to distal B/L UE: Shoulder abduction 2/5, Right elbow flexion/extension 4/5, hand 4/5 with. Left biceps 3/5. Left triceps 4/5. HI 4-5/5. --stable Skin:  Incision c/d/i with honeycomb dressing in place Psychiatric: anxious  Assessment/Plan: 1. Debility and functional deficits secondary to cervical spondylosis/radiculopathy (s/p C4-7 ACDF, C3-6 PCF)  which require 3+ hours per day of interdisciplinary therapy in a comprehensive inpatient rehab setting. Physiatrist is providing close team supervision and 24  hour management of active medical problems listed below. Physiatrist and rehab team continue to assess barriers to discharge/monitor patient progress toward functional and medical goals.  Function:  Bathing Bathing position   Position: Wheelchair/chair at sink  Bathing parts Body parts bathed by patient: Chest, Abdomen, Front perineal area, Right upper leg, Left upper leg Body parts bathed by helper: Buttocks, Right lower leg, Left lower leg, Back, Right arm, Left arm  Bathing assist Assist Level:  (mod A)      Upper Body Dressing/Undressing Upper body dressing   What is the patient wearing?: Pull over shirt/dress     Pull over shirt/dress - Perfomed by patient: Thread/unthread right sleeve Pull over shirt/dress - Perfomed by helper: Thread/unthread left sleeve, Put head through opening, Pull shirt over trunk        Upper body assist Assist Level:  (max A)      Lower Body Dressing/Undressing Lower body dressing   What is the patient wearing?: Pants     Pants- Performed by patient: Thread/unthread right pants leg Pants- Performed by helper: Thread/unthread left pants leg, Pull pants up/down                      Lower body assist Assist for lower body dressing:  (max A)      Toileting Toileting   Toileting steps completed by patient: Adjust clothing prior to toileting Toileting steps completed by helper: Performs perineal hygiene, Adjust clothing after toileting Toileting Assistive Devices: Grab bar or rail  Toileting assist Assist level:  (max A)  Transfers Chair/bed transfer   Chair/bed transfer method: Stand pivot Chair/bed transfer assist level: Touching or steadying assistance (Pt > 75%) Chair/bed transfer assistive device: Walker, Designer, fashion/clothing     Max distance: 120 Assist level: Touching or steadying assistance (Pt > 75%)   Wheelchair   Type: Manual Max wheelchair distance: 20 Assist Level: Moderate assistance (Pt 50 -  74%)  Cognition Comprehension Comprehension assist level: Understands complex 90% of the time/cues 10% of the time  Expression Expression assist level: Expresses basic needs/ideas: With no assist  Social Interaction Social Interaction assist level: Interacts appropriately with others with medication or extra time (anti-anxiety, antidepressant).  Problem Solving Problem solving assist level: Solves basic problems with no assist  Memory Memory assist level: Recognizes or recalls 90% of the time/requires cueing < 10% of the time   Medical Problem List and Plan: 1.  Debilitation with neck and shoulder pain secondary to cervical spondylosis with radiculopathy. Status post C4-7 anterior discectomy and fusion, posterior cervical fusion C3-C6. Cervical collar all times  -continue CIR therapies 2.  DVT Prophylaxis/Anticoagulation: SCDs.  Dopplers negative 3. Pain Management: OxyContin sustained release 30 mg every 12 hours, Neurontin 600 mg 3 times a day, Oxycodone, Robaxin as needed  -pain is a barrier 4. Mood: Provide emotional support 5. Neuropsych: This patient is capable of making decisions on his own behalf. 6. Skin/Wound Care: Routine skin checks 7. Fluids/Electrolytes/Nutrition: encourage PO  -BUN trending up 8. Acute blood loss anemia. Follow-up CBC with hgb 9.1   9. CAD with history of MI. Patient on aspirin and Brilinta prior to admission. 10. Diet-controlled diabetes mellitus.. Diabetic teaching. Hemoglobin A1c 6.2 11. CKD stage III. Mild ARI,  Creatinine baseline 1.43-1.54  -encourage PO fluids  -recheck bmet in am 12. History of CVA without residual. Discuss resuming aspirin. 13. Leukocytosis: WBC's trending up  -afebrile without any clinical signs of infection  -likely steroid related  -continue to monitor closely for now   LOS (Days) 2 A FACE TO FACE EVALUATION WAS PERFORMED  Ranelle Oyster, MD 01/30/2017 8:55 AM

## 2017-01-30 NOTE — Progress Notes (Signed)
Occupational Therapy Session Note  Patient Details  Name: Russell Carter MRN: [id number] Date of Birth: 1956/05/11  Today's Date: 01/30/2017 OT Individual Time: 0800-0900 OT Individual Time Calculation (min): 60 min    Short Term Goals: Week 1:  OT Short Term Goal 1 (Week 1): Pt will perform LB clothing management during toileting with steady assistance. OT Short Term Goal 2 (Week 1): Pt will perform LB dressing with mod A in order to increase I with self care. OT Short Term Goal 3 (Week 1): Pt will engage in 15 minutes of functional tasks with 2 rest breaks or less during this time.  OT Short Term Goal 4 (Week 1): Pt will perform UB dressing with mod A in order to increase I with self care tasks.  Skilled Therapeutic Interventions/Progress Updates:    Pt resting in bed upon arrival and requested to use toilet prior to engaging in BADL retraining and eating breakfast.  Pt amb without AD into bathroom and completed toileting tasks with assistance required for toilet hygiene.  Pt returned to w/c at sink and engaged in bathing/dressing tasks with sit<>stand from w/c at sink.  Pt required assistance with all functions secondary to limited movement in BUE proximally.  Pt noted with adequate B grasp but limited functional movement in shoulders.  Pt initiated all tasks but required assistance to complete.  Pt remained in w/c for breakfast.  Pt issued plastic cup w/ lid and straw to facilitate fluid intake.  Pt remained in w/c with all needs within reach. Focus on BUE use to increase independence with BADLs, safety awareness, functional amb without AD, activity tolerance, and standing balance to increase independence with BADLs.   Therapy Documentation Precautions:  Precautions Precautions: Cervical, Fall Required Braces or Orthoses: Cervical Brace Cervical Brace: Hard collar Restrictions Weight Bearing Restrictions: No Pain: Pain Assessment Pain Assessment: 0-10 Pain Score: 10-Worst pain  ever Pain Type: Acute pain Pain Location: Neck Pain Orientation: Anterior;Posterior Pain Descriptors / Indicators: Aching Pain Frequency: Intermittent Pain Onset: With Activity Patients Stated Pain Goal: 2 Pain Intervention(s): Medication (See eMAR) Multiple Pain Sites: No  See Function Navigator for Current Functional Status.   Therapy/Group: Individual Therapy  Russell Carter 01/30/2017, 9:04 AM

## 2017-01-30 NOTE — Progress Notes (Signed)
Occupational Therapy Note  Patient Details  Name: Manit Pugliano MRN: [id number] Date of Birth: 1955-12-20  Today's Date: 01/30/2017 OT Individual Time: 1400-1425 OT Individual Time Calculation (min): 25 min   Pt c/o 8/10 pain in posterior neck and B shoulders; RN aware and repositioned Individual Therapy  Pt resting in bed upon arrival.  Pt sat EOB and engaged in BUE table tasks with focus on functional movement with gravity eliminated to facilitate improved quality of movement.  Pt continues to exhibit considerable compensatory movements to accomplish tasks.  Pt returned to supine to rest until next therapy session later in the afternoon. Focus on increased BUE function to increase independence with BADLs.    Lavone Neri [facility] 01/30/2017, 3:00 PM

## 2017-01-30 NOTE — Progress Notes (Signed)
Social Work Assessment and Plan  Patient Details  Name: Russell Carter MRN: [id number] Date of Birth: 05-15-1956  Today's Date: 01/30/2017  Problem List:  Patient Active Problem List   Diagnosis Date Noted  . Debility   . Post-operative pain   . Radicular pain   . Diabetes mellitus type 2 in nonobese (HCC)   . Surgery, elective   . Postoperative pain   . Leukocytosis   . Thrombocytopenia ([place])   . Acute blood loss anemia   . History of myocardial infarction   . Cocaine abuse   . Depression   . Type 2 diabetes mellitus with hyperglycemia, without long-term current use of insulin ([place])   . Stage 3 chronic kidney disease   . Benign essential HTN   . History of CVA (cerebrovascular accident)   . Prediabetes   . Cervical spondylosis with radiculopathy 01/24/2017  . Dyslipidemia 03/15/2016  . Unstable angina ([place]) - chest pain with complete heart block & chest pain 03/15/2016  . Complete heart block ([place]) 03/14/2016  . Cervical spinal stenosis 03/14/2016  . Chest pain with moderate risk of acute coronary syndrome 03/13/2016  . CAD-50% LAD 2011 12/14/2014  . Gout 11/16/2014  . Headache 07/15/2013  . Polysubstance abuse 01/15/2013  . DIZZINESS 06/05/2010  . Essential hypertension 05/18/2009  . Acute myocardial infarction 05/18/2009  . MUSCLE SPASM 05/18/2009   Past Medical History:  Past Medical History:  Diagnosis Date  . Anginal pain ([place])   . Arthritis   . Blood in stool   . CAD (coronary artery disease)    a. 2011: cath showing mod non-obstructive disease b. 02/2016: cath with 95% stenosis in the Mid RCA. DES placed.  . Cervical stenosis of spine   . Cocaine abuse    history  . Depression   . Diabetes mellitus without complication ([place])   . Gout, unspecified   . Headache   . HTN (hypertension)   . Hypercholesterolemia   . Hyperglycemia   . Migraines   . Myocardial infarction 2008   drug cocaine and marijuana at time of mi none  since    . Pneumonia   . Renal  insufficiency   . Stroke [facility])    Past Surgical History:  Past Surgical History:  Procedure Laterality Date  . ANTERIOR CERVICAL DECOMP/DISCECTOMY FUSION N/A 01/24/2017   Procedure: Cervical Four-Five,Cervical Five Six,Cervical Six-Seven Anterior cervical discectomy with fusion and plate with Cervical Three-Thoracic- One Laminectomy/Foraminotomy with Cervical Three-Thoracic- One Fixation and fusion;  Surgeon: Kevan Ny Ditty, MD;  Location: [facility];  Service: Neurosurgery;  Laterality: N/A;  Part-1 anterior approach  . CARDIAC CATHETERIZATION  2008; ~ 2011   [place]-40%lad  . CARDIAC CATHETERIZATION N/A 03/16/2016   Procedure: Left Heart Cath and Coronary Angiography;  Surgeon: Leonie Man, MD;  Location: [facility];  Service: Cardiovascular;  Laterality: N/A;  . CORONARY ANGIOPLASTY     STENTS  . EYE SURGERY Bilateral    "for double vision"  . INGUINAL HERNIA REPAIR  05/12/2012   Procedure: HERNIA REPAIR INGUINAL ADULT;  Surgeon: Zenovia Jarred, MD;  Location: [facility];  Service: General;  Laterality: Right;  . INGUINAL HERNIA REPAIR Left 02/02/2003   Dr. Georganna Skeans. repair with mesh. Same procedure done again in  05/22/2004   . INGUINAL HERNIA REPAIR Left ?date 2nd OR  . POSTERIOR CERVICAL FUSION/FORAMINOTOMY N/A 01/24/2017   Procedure: Cervical Three-Thoracic One-Laminectomy/Foraminotomy with Cervical Three-Thoracic One Fixation and fusion;  Surgeon: Kevan Ny Ditty, MD;  Location[facility];  Service: Neurosurgery;  Laterality: N/A;  Part-2 Posterior approach  . RECONSTRUCT / STABILIZE DISTAL ULNA    . SHOULDER OPEN ROTATOR CUFF REPAIR Left 2010  . SHOULDER SURGERY Left    "injured on the job; had to cut small bone out"  . THUMB FUSION Right    Social History:  reports that he has never smoked. He has never used smokeless tobacco. He reports that he drinks about 13.8 oz of alcohol per week . He reports that he uses drugs, including "Crack" cocaine,  Marijuana, and Cocaine.  Family / Support Systems Marital Status: Widow/Widower Patient Roles: Parent, Other (Comment) (grandfather; great grandfather; son; brother) Children: Nahmir, Zeidman - son - [phone] Other Supports: family; church family Anticipated Caregiver: Nelma Rothman, brother and other family members as needed. Has 5 girls and 1 boy.  14 grandchildren and 3 great grandchildren. Ability/Limitations of Caregiver: none Caregiver Availability: 24/7 Family Dynamics: Pt reports supportive relationships with family.  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  LIkes being a security guard. Legal History/Current Legal Issues: none reported Guardian/Conservator: N/A - MD has determined that pt is able to make his own decisions.   Abuse/Neglect Physical Abuse: Denies Verbal Abuse: Denies Sexual Abuse: Denies Exploitation of patient/patient's resources: Denies Self-Neglect: Denies  Emotional Status Pt's affect, behavior and adjustment status: Pt reports feeling well emotionally and is encouraged by his progress already and feeling better today than yesterday. Recent Psychosocial Issues: none reported Psychiatric History: Pt's chart lists depression, but pt reports feeling okay emotionally. Substance Abuse History: Pt with remote hx of substance abuse.  Reports he does not do use drugs or alcohol anymore and has turned his life around after his MI.  Patient / Family Perceptions, Expectations & Goals Pt/Family understanding of illness & functional limitations: Pt expressed a good understanding of his limitations and functional status. Premorbid pt/family roles/activities: Pt enjoys shooting basketball and playing tennis. He also likes his work.  Pt also sings in choir at  church. Anticipated changes in roles/activities/participation: Pt would like to resume work as soon as he is able and states that his job will be there when he is ready to return.  Would also like to resume physical activities and singing with the choir when he is able. Pt/family expectations/goals: Pt would like to return to work.    Community Resources [employer]: None Premorbid Home Care/DME Agencies: None (Pt's mother uses a Information systems manager and has grab bars.) Transportation available at discharge: son Resource referrals recommended: Neuropsychology  Discharge Planning Living Arrangements: Parent, Other relatives (lives with 39 y/o mother and brother, Environmental education officer) Support Systems: Children, Counselling psychologist, Other relatives, Friends/neighbors, Psychologist, clinical community Type of Residence: Private residence Community education officer Resources: Medicaid (specify county) Medical sales representative) Surveyor, quantity Resources: Employment, Garment/textile technologist Screen Referred: No Living Expenses: Lives with family Money Management: Patient Does the patient have any problems obtaining your medications?: No Home Management: Pt and his brother were doing this for his mother.  She was mostly independent, but is 31, so they would help with managing home.  Brother can continue this. Patient/Family Preliminary Plans: Pt plans to return to his mother's home where his brother can provide 24/7 supervision. Barriers to Discharge: Steps (just 1) Social Work Anticipated Follow Up Needs: HH/OP Expected length of stay: 2 weeks  Clinical Impression CSW met with pt to introduce self and role of CSW, as well as to complete assessment.  Pt was too tired to meet with CSW yesterday and did better today, but then developed stomach pain and needed to lay down.  He continued to try to talk with CSW and CSW tried to distract him.  Pt will have support from his son and brother when he is d/c'd.  CSW left a message with son so that he was aware of CSW working with his  father.  Pt will need DME and not sure that pt will have a qualifying diagnosis for Medicaid to cover therapies; CSW to investigate this further.  Besides pain, pt does not have any current concerns, issues, needs for CSW to address at this time.  Medical team is aware of pain and slight anxiety issues and neuropsychologist, Dr. Ilean Skill, has met with pt to offer support.  CSW will continue to follow and assist as needed.  Emagene Merfeld, Silvestre Mesi 01/30/2017, 2:37 PM

## 2017-01-31 ENCOUNTER — Inpatient Hospital Stay (HOSPITAL_COMMUNITY): Payer: Medicaid Other

## 2017-01-31 ENCOUNTER — Inpatient Hospital Stay (HOSPITAL_COMMUNITY): Payer: Medicaid Other | Admitting: Physical Therapy

## 2017-01-31 LAB — BASIC METABOLIC PANEL
ANION GAP: 7 (ref 5–15)
BUN: 34 mg/dL — ABNORMAL HIGH (ref 6–20)
CO2: 25 mmol/L (ref 22–32)
Calcium: 8.4 mg/dL — ABNORMAL LOW (ref 8.9–10.3)
Chloride: 106 mmol/L (ref 101–111)
Creatinine, Ser: 1.23 mg/dL (ref 0.61–1.24)
GFR calc non Af Amer: >60 mL/min (ref >60)
Glucose, Bld: 117 mg/dL — ABNORMAL HIGH (ref 65–99)
POTASSIUM: 4.6 mmol/L (ref 3.5–5.1)
Sodium: 138 mmol/L (ref 135–145)

## 2017-01-31 MED ORDER — MUSCLE RUB 10-15 % EX CREA
TOPICAL_CREAM | CUTANEOUS | Status: DC | PRN
  Administered 2017-01-31 – 2017-02-06 (×5): via TOPICAL
  Filled 2017-01-31: qty 85

## 2017-01-31 MED ORDER — DEXAMETHASONE 4 MG PO TABS
4.0000 mg | ORAL_TABLET | Freq: Two times a day (BID) | ORAL | Status: DC
  Administered 2017-01-31 – 2017-02-04 (×8): 4 mg via ORAL
  Filled 2017-01-31 (×8): qty 1

## 2017-01-31 NOTE — Progress Notes (Signed)
Occupational Therapy Session Note  Patient Details  Name: Russell Carter MRN: [id number] Date of Birth: Nov 17, 1955  Today's Date: 01/31/2017 OT Individual Time: 0700-0828 OT Individual Time Calculation (min): 88 min    Short Term Goals: Week 1:  OT Short Term Goal 1 (Week 1): Pt will perform LB clothing management during toileting with steady assistance. OT Short Term Goal 2 (Week 1): Pt will perform LB dressing with mod A in order to increase I with self care. OT Short Term Goal 3 (Week 1): Pt will engage in 15 minutes of functional tasks with 2 rest breaks or less during this time.  OT Short Term Goal 4 (Week 1): Pt will perform UB dressing with mod A in order to increase I with self care tasks.  Skilled Therapeutic Interventions/Progress Updates:    Pt asleep in bed upon arrival and agreeable to eating breakfast.  Pt required more than a reasonable amount of time to self feed with occasional assistance to scoop items on utensil.  UE use increases pain in B traps/rhomboids.  Pt engaged in self feeding while sitting unsupported EOB.  Pt also engaged in grooming tasks while seated EOB.  Pt declined bathing/dressing this morning.  Soft tissue mobilizations performed on patients traps and rhomboids with temporary relief noted.  Pt requested use of toilet after eating breakfast and amb without AD at supervision level to bathroom.  Pt able to doff pants but required assistance with toilet hygiene and pulling up pants. Pt returned to bed and remained in bed with all needs within reach and bed alarm activated.  Focus on activity tolerance, self feeding, BUE use during functional tasks, functional amb, standing balance, and safety awareness to increase independence with BADLs.   Therapy Documentation Precautions:  Precautions Precautions: Cervical, Fall Required Braces or Orthoses: Cervical Brace Cervical Brace: Hard collar Restrictions Weight Bearing Restrictions: No  Pain:  6/10 to 10/10 after  activity in B shoulders/traps; RN aware, PA aware; soft tissue mobilizations; repositioned  See Function Navigator for Current Functional Status.   Therapy/Group: Individual Therapy  Rich Brave 01/31/2017, 8:31 AM

## 2017-01-31 NOTE — IPOC Note (Signed)
Overall Plan of Care [facility]) Patient Details Name: Russell Carter MRN: [id number] DOB: 06/19/1956  Admitting Diagnosis: cervical fusion  Hospital Problems: Principal Problem:   Cervical spondylosis with radiculopathy Active Problems:   Essential hypertension   Leukocytosis   Type 2 diabetes mellitus with hyperglycemia, without long-term current use of insulin (HCC)   Post-operative pain     Functional Problem List: Nursing Bladder, Endurance, Medication Management, Motor, Pain, Safety, Skin Integrity  PT Balance, Motor, Endurance, Pain, Safety, Sensory  OT Balance, Endurance, Motor, Pain, Safety, Sensory  SLP    TR         Basic ADL's: OT Eating, Grooming, Bathing, Dressing, Toileting     Advanced  ADL's: OT Simple Meal Preparation     Transfers: PT Bed Mobility, Bed to Chair, Car, Occupational psychologist, Research scientist (life sciences): PT Stairs, Psychologist, prison and probation services, Ambulation     Additional Impairments: OT Fuctional Use of Upper Extremity  SLP        TR      Anticipated Outcomes Item Anticipated Outcome  Self Feeding set up  Swallowing      Basic self-care  mod I - supervision  Toileting  supervision   Bathroom Transfers mod I toilet transfer  Bowel/Bladder  Mod I   Transfers  modI  Locomotion  S with LRAD in home and community  Communication     Cognition     Pain  less than 5  Safety/Judgment  Will remain free of falls, infection, skin breakdown while on rehab   Therapy Plan: PT Intensity: Minimum of 1-2 x/day ,45 to 90 minutes PT Frequency: 5 out of 7 days PT Duration Estimated Length of Stay: 14 days OT Intensity: Minimum of 1-2 x/day, 45 to 90 minutes OT Frequency: 5 out of 7 days OT Duration/Estimated Length of Stay: 14 days         Team Interventions: Nursing Interventions Patient/Family Education, Bladder Management, Pain Management, Medication Management, Skin Care/Wound Management, Discharge Planning, Psychosocial Support  PT  interventions Ambulation/gait training, Warden/ranger, Community reintegration, Development worker, international aid stimulation, DME/adaptive equipment instruction, Disease management/prevention, Discharge planning, Functional mobility training, Neuromuscular re-education, Pain management, Patient/family education, Stair training, UE/LE Coordination activities, UE/LE Strength taining/ROM, Splinting/orthotics, Therapeutic Exercise, Wheelchair propulsion/positioning, Therapeutic Activities, Psychosocial support  OT Interventions Balance/vestibular training, Neuromuscular re-education, Self Care/advanced ADL retraining, Therapeutic Exercise, UE/LE Strength taining/ROM, Pain management, DME/adaptive equipment instruction, Community reintegration, Equities trader education, UE/LE Coordination activities, Discharge planning, Functional mobility training, Psychosocial support, Therapeutic Activities  SLP Interventions    TR Interventions    SW/CM Interventions Discharge Planning, Psychosocial Support, Patient/Family Education    Team Discharge Planning: Destination: PT-Home ,OT- Home , SLP-  Projected Follow-up: PT-Outpatient PT, OT-  24 hour supervision/assistance, Outpatient OT, SLP-  Projected Equipment Needs: PT-To be determined, OT- To be determined, SLP-  Equipment Details: PT- , OT-  Patient/family involved in discharge planning: PT- Patient,  OT-Patient, SLP-   MD ELOS: 14 days Medical Rehab Prognosis:  Excellent Assessment: The patient has been admitted for CIR therapies with the diagnosis of cervical spondylosis with radiculopathy s/p decompression and fusion. The team will be addressing functional mobility, strength, stamina, balance, safety, adaptive techniques and equipment, self-care, bowel and bladder mgt, patient and caregiver education, pain mgt, ego support, activity tolerance, community reintegration. Goals have been set at mod I to supervision with mobility and self-care. Pain has been a  barrier at times    Ranelle Oyster, MD, [facility]      See Team  Conference Notes for weekly updates to the plan of care

## 2017-01-31 NOTE — Progress Notes (Signed)
Occupational Therapy Note  Patient Details  Name: Russell Carter MRN: [id number] Date of Birth: 07/08/1956  Today's Date: 01/31/2017 OT Individual Time: 1300-1330 OT Individual Time Calculation (min): 30 min   Pt c/o 8/10 pain in B shoulders/traps/rhomboids; RN aware, PA ordered Kpad, repositioned Individual therapy  Pt resting in bed upon arrival and required mod multimodal cues to arouse.  Pt exhibited difficulty with keeping eyes open throughout session.  Pt engaged in dynamic standing tasks at sink with focus on increased functional use of BUE.  Informed pt that PA cleared him for showering (except head).  Pt agreeable to shower tomorrow morning.  Pt returned to bed with all needs within reach and bed alarm activated.    Lavone Neri [facility] 01/31/2017, 2:29 PM

## 2017-01-31 NOTE — Progress Notes (Signed)
Physical Therapy Session Note  Patient Details  Name: Russell Carter MRN: [id number] Date of Birth: 1956-03-07  Today's Date: 01/31/2017 PT Individual Time: 7989-2119 PT Individual Time Calculation (min): 37 min   Short Term Goals: Week 1:  PT Short Term Goal 1 (Week 1): Pt will ambulate 150' RW and min guard PT Short Term Goal 2 (Week 1): Pt will ascend/descent 1 flight stairs 6" height with 1 handrail min guard PT Short Term Goal 3 (Week 1): Pt will demonstrate dynamic standing balance min guard x10 min  Skilled Therapeutic Interventions/Progress Updates:    Pt in bed upon arrival, initially reluctant but eventually willing to participate in session with encouragement.  Gait: ambulating from room<>gym with rw- decreased stride length and slow pattern. Transfers: supervision for sit<>stand transfers using rw. Balance: performing balance activities with narrowing base of support. Increased difficulty with heel-toe position, requiring physical assist to maintain balance. Taking 1 supine break due to pain, using tennis balls for TPR through interscapular region and manual TPR along Lt levator, UT regions. Encouraged pt to work on gentle scapular elevation and retraction. Pt returned to bed following session with call light on, reports pain as 1/10. All needs in reach.   Therapy Documentation Precautions:  Precautions Precautions: Cervical, Fall Required Braces or Orthoses: Cervical Brace Cervical Brace: Hard collar Restrictions Weight Bearing Restrictions: No   Pain:  7/10 at beginning of session and 1/10 following.  See Function Navigator for Current Functional Status.   Therapy/Group: Individual Therapy  Delton See, PT 01/31/2017, 3:44 PM

## 2017-01-31 NOTE — Progress Notes (Signed)
Physical Therapy Session Note  Patient Details  Name: Russell Carter MRN: [id number] Date of Birth: 1956/07/04  Today's Date: 01/31/2017 PT Individual Time: 0915-1015 PT Individual Time Calculation (min): 60 min   Short Term Goals: Week 1:  PT Short Term Goal 1 (Week 1): Pt will ambulate 150' RW and min guard PT Short Term Goal 2 (Week 1): Pt will ascend/descent 1 flight stairs 6" height with 1 handrail min guard PT Short Term Goal 3 (Week 1): Pt will demonstrate dynamic standing balance min guard x10 min  Skilled Therapeutic Interventions/Progress Updates: Pt received supine in bed, c/o pain 2/10 as below and agreeable to treatment. Supine>sit modI. Gait to gym with RW and S; slow speed. Sit <>supine on mat with S. Supine lying with tennis balls under mid traps for trigger point release x6 min, repositioned once superiorly. Supine LE exercises including straight leg raise and heel slides 1x15 each. Standing progressive balance exercises on level surface including normal BOS, narrow BOS, staggered stance, and single limb stance; most difficulty with SLS able to hold RLE 9 sec, LLE 12 sec before losing balance, recovers without assist. Returned to supine d/t increase in neck pain; remained reclined several minutes before able to participate again. Performed nustep x10 min total, BLE RUE with hot pack on back during activity d/t pain. Returned to room totalA in w/c d/t pain. Stand pivot transfer to bed min guard. Sit >supine with S; remained supine in bed at end of session, alarm intact and all needs in reach.      Therapy Documentation Precautions:  Precautions Precautions: Cervical, Fall Required Braces or Orthoses: Cervical Brace Cervical Brace: Hard collar Restrictions Weight Bearing Restrictions: No Pain: Pain Assessment Pain Assessment: 0-10 Pain Score: 2  Pain Type: Acute pain Pain Location: Neck Pain Orientation: Mid;Lower Pain Descriptors / Indicators: Aching Pain Frequency:  Intermittent Pain Onset: On-going Patients Stated Pain Goal: 1 Pain Intervention(s): Other (Comment) (soft tissue mobilization/TP release) Multiple Pain Sites: No   See Function Navigator for Current Functional Status.   Therapy/Group: Individual Therapy  Vista Lawman 01/31/2017, 10:44 AM

## 2017-01-31 NOTE — Progress Notes (Signed)
[place] PHYSICAL MEDICINE & REHABILITATION     PROGRESS NOTE    Subjective/Complaints: Persistent pain issues. Perhaps slept a little better last night  ROS: pt denies nausea, vomiting, diarrhea, cough, shortness of breath or chest pain   Objective: Vital Signs: Blood pressure (!) 135/96, pulse 69, temperature 98 F (36.7 C), temperature source Oral, resp. rate 16, height 5\' 11"  (1.803 m), weight 83.7 kg (184 lb 8.4 oz), SpO2 98 %. No results found.  Recent Labs  01/29/17 0726  WBC 14.6*  HGB 9.1*  HCT 28.2*  PLT 303    Recent Labs  01/29/17 0726 01/31/17 0547  NA 138 138  K 4.3 4.6  CL 102 106  GLUCOSE 130* 117*  BUN 46* 34*  CREATININE 1.62* 1.23  CALCIUM 8.6* 8.4*   CBG (last 3)  No results for input(s): GLUCAP in the last 72 hours.  Wt Readings from Last 3 Encounters:  01/30/17 83.7 kg (184 lb 8.4 oz)  01/24/17 89.8 kg (197 lb 15.6 oz)  01/21/17 90 kg (198 lb 8 oz)    Physical Exam:  Constitutional: He appears well-developed and well-nourished.  HENT:  Head: Normocephalic and atraumatic.  Eyes: EOMI.  Neck:  Cervical collar in place and fitting Cardiovascular: RRR  Respiratory: CTA B GI: Soft. Bowel sounds are normal. He exhibits no distension.  Musculoskeletal: He exhibits edema. He exhibits no tenderness.  Neurological: He is alert.  Sensation diminished to light touch in shoulders, hands, feet---inconsistent Hoarse voice but quite intelligible.  Motor: B/l LE: 4-4+/5 proximal to distal B/L UE: Shoulder abduction 3 to 3+/5, Right elbow flexion/extension 4/5, hand 4/5 with. Left biceps 3+/5. Left triceps 4/5. HI 4-5/5. --stable Skin:  Incision c/d/i with honeycomb dressing in place Psychiatric: anxious  Assessment/Plan: 1. Debility and functional deficits secondary to cervical spondylosis/radiculopathy (s/p C4-7 ACDF, C3-6 PCF)  which require 3+ hours per day of interdisciplinary therapy in a comprehensive inpatient rehab  setting. Physiatrist is providing close team supervision and 24 hour management of active medical problems listed below. Physiatrist and rehab team continue to assess barriers to discharge/monitor patient progress toward functional and medical goals.  Function:  Bathing Bathing position   Position: Wheelchair/chair at sink  Bathing parts Body parts bathed by patient: Chest, Abdomen, Front perineal area, Right upper leg, Left upper leg Body parts bathed by helper: Buttocks, Right lower leg, Left lower leg, Back, Right arm, Left arm  Bathing assist Assist Level:  (mod A)      Upper Body Dressing/Undressing Upper body dressing   What is the patient wearing?: Pull over shirt/dress     Pull over shirt/dress - Perfomed by patient: Thread/unthread right sleeve Pull over shirt/dress - Perfomed by helper: Thread/unthread left sleeve, Put head through opening, Pull shirt over trunk        Upper body assist Assist Level:  (max A)      Lower Body Dressing/Undressing Lower body dressing   What is the patient wearing?: Pants, Non-skid slipper socks     Pants- Performed by patient: Thread/unthread right pants leg Pants- Performed by helper: Thread/unthread left pants leg, Pull pants up/down   Non-skid slipper socks- Performed by helper: Don/doff right sock, Don/doff left sock                  Lower body assist Assist for lower body dressing:  (max A)      Toileting Toileting   Toileting steps completed by patient: Adjust clothing prior to toileting Toileting steps completed  by helper: Performs perineal hygiene, Adjust clothing after toileting Toileting Assistive Devices: Grab bar or rail  Toileting assist Assist level: Touching or steadying assistance (Pt.75%)   Transfers Chair/bed transfer   Chair/bed transfer method: Stand pivot Chair/bed transfer assist level: Touching or steadying assistance (Pt > 75%) Chair/bed transfer assistive device: Walker, IT consultant     Max distance: 150 Assist level: Supervision or verbal cues   Wheelchair   Type: Manual Max wheelchair distance: 20 Assist Level: Moderate assistance (Pt 50 - 74%)  Cognition Comprehension Comprehension assist level: Follows basic conversation/direction with no assist  Expression Expression assist level: Expresses basic needs/ideas: With no assist  Social Interaction Social Interaction assist level: Interacts appropriately 90% of the time - Needs monitoring or encouragement for participation or interaction.  Problem Solving Problem solving assist level: Solves basic problems with no assist  Memory Memory assist level: Recognizes or recalls 90% of the time/requires cueing < 10% of the time   Medical Problem List and Plan: 1.  Debilitation with neck and shoulder pain secondary to cervical spondylosis with radiculopathy. Status post C4-7 anterior discectomy and fusion, posterior cervical fusion C3-C6. Cervical collar all times  -continue CIR therapies 2.  DVT Prophylaxis/Anticoagulation: SCDs.  Dopplers negative 3. Pain Management: OxyContin sustained release 30 mg every 12 hours, Neurontin 600 mg 3 times a day, Oxycodone, Robaxin as needed  -pain is a barrier. We have discussed the fact that he needs to work through it  -he is on a substantial amount of medication to treat his pain on different levels 4. Mood: Provide emotional support 5. Neuropsych: This patient is capable of making decisions on his own behalf. 6. Skin/Wound Care: Routine skin checks 7. Fluids/Electrolytes/Nutrition:    -BUN sl improved today 34. Cr decreased also  -continue to push fluids 8. Acute blood loss anemia. Follow-up CBC with hgb 9.1   9. CAD with history of MI. Patient on aspirin and Brilinta prior to admission. 10. Diet-controlled diabetes mellitus.. Diabetic teaching. Hemoglobin A1c 6.2 11. CKD stage III. Mild ARI,  Creatinine baseline 1.43-1.54  -encourage PO fluids  -Cr  1.23 today 12. History of CVA without residual. Discuss resuming aspirin. 13. Leukocytosis: WBC's trending up  -afebrile without any clinical signs of infection  -likely steroid related--begin steroid wean  -continue to monitor      LOS (Days) 3 A FACE TO FACE EVALUATION WAS PERFORMED  Ranelle Oyster, MD 01/31/2017 8:43 AM

## 2017-02-01 ENCOUNTER — Inpatient Hospital Stay (HOSPITAL_COMMUNITY): Payer: Medicaid Other | Admitting: Physical Therapy

## 2017-02-01 ENCOUNTER — Inpatient Hospital Stay (HOSPITAL_COMMUNITY): Payer: Medicaid Other

## 2017-02-01 NOTE — Progress Notes (Signed)
Occupational Therapy Session Note  Patient Details  Name: Russell Carter MRN: [id number] Date of Birth: April 23, 1956  Today's Date: 02/01/2017 OT Individual Time: 1500-1600 OT Individual Time Calculation (min): 60 min    Short Term Goals: Week 1:  OT Short Term Goal 1 (Week 1): Pt will perform LB clothing management during toileting with steady assistance. OT Short Term Goal 2 (Week 1): Pt will perform LB dressing with mod A in order to increase I with self care. OT Short Term Goal 3 (Week 1): Pt will engage in 15 minutes of functional tasks with 2 rest breaks or less during this time.  OT Short Term Goal 4 (Week 1): Pt will perform UB dressing with mod A in order to increase I with self care tasks.  Skilled Therapeutic Interventions/Progress Updates:    1:1. Focus of session on toileting, functional use of LUE, and gravity eliminated exercise to improve BUE strength/ROM required for ADLs. Pt ambulates with touching assistance to/from toilet and stands to void bladder. Pt require A to advance pants up hips only. Pt ambulates to/from all therapeutic destinations with supervision-touching A and VC to not cross feet when walking. Pt completes 2x15 of towel glides on table with MOD VC for technique, scapular mobilization for protraction/retraction and rest breaks in between sets. Pt completes AAROM of LUE to flex bicep with OT providing support at elbow and wrist to complete hand to mouth excursion and bring styrofoam cup to mouth. Pt takes beads off of string placed to R side of pt, requiring pt to complete horizontal ab/adduction of L shoulder. Pt requires increased time to manipulate beads and support at elbow to assist to eliminate gravity d/t weak deltoid. Pt completes bicep flexion of LUE to put beads one at a time into container. Exited session with pt supine in bed with bed exit alarm on and call light in reach.   Therapy Documentation Precautions:  Precautions Precautions: Cervical,  Fall Required Braces or Orthoses: Cervical Brace Cervical Brace: Hard collar Restrictions Weight Bearing Restrictions: No General:   Vital Signs: Therapy Vitals Temp: 98.8 F (37.1 C) Temp Source: Oral Pulse Rate: 89 Resp: 18 BP: 136/86 Patient Position (if appropriate): Lying Oxygen Therapy SpO2: 96 % O2 Device: Not Delivered Pain: Pain Assessment Pain Assessment: 0-10 Pain Score: 0-No pain (premedicated) ADL:   Exercises:   Other Treatments:    See Function Navigator for Current Functional Status.   Therapy/Group: Individual Therapy  Shon Hale 02/01/2017, 3:20 PM

## 2017-02-01 NOTE — Progress Notes (Signed)
Occupational Therapy Session Note  Patient Details  Name: Dezjuan Copus MRN: [id number] Date of Birth: 1955-12-18  Today's Date: 02/01/2017 OT Individual Time: 0700-0755 OT Individual Time Calculation (min): 55 min    Short Term Goals: Week 1:  OT Short Term Goal 1 (Week 1): Pt will perform LB clothing management during toileting with steady assistance. OT Short Term Goal 2 (Week 1): Pt will perform LB dressing with mod A in order to increase I with self care. OT Short Term Goal 3 (Week 1): Pt will engage in 15 minutes of functional tasks with 2 rest breaks or less during this time.  OT Short Term Goal 4 (Week 1): Pt will perform UB dressing with mod A in order to increase I with self care tasks.  Skilled Therapeutic Interventions/Progress Updates:    Focus on BADL retraining, standing balance, functional amb without AD, and safety awareness to increase independence with BADLs. Pt amb without AD to bathroom and engaged in bathing tasks seated and standing in shower.  Pt required increased assistance with bathing tasks secondary to limited BUE movement and increased pain.  Pt engaged in dressing tasks with sit<>stand from EOB.  Pt requested to lay down X 4 during session to relieve pain in B shoulders/upper back.  Pt remained in bed with all needs within reach and bed alarm activated.   Therapy Documentation Precautions:  Precautions Precautions: Cervical, Fall Required Braces or Orthoses: Cervical Brace Cervical Brace: Hard collar Restrictions Weight Bearing Restrictions: No Pain:  7/10 increasing to 10/10 after sitting/standing for approx 5 mins; RN aware and repositioned  See Function Navigator for Current Functional Status.   Therapy/Group: Individual Therapy  Rich Brave 02/01/2017, 7:57 AM

## 2017-02-01 NOTE — Progress Notes (Signed)
[place] PHYSICAL MEDICINE & REHABILITATION     PROGRESS NOTE    Subjective/Complaints: Persistent pain issues. Perhaps slept a little better last night  ROS: pt denies nausea, vomiting, diarrhea, cough, shortness of breath or chest pain   Objective: Vital Signs: Blood pressure 130/85, pulse 72, temperature 98.4 F (36.9 C), temperature source Oral, resp. rate 17, height 5\' 11"  (1.803 m), weight 83.7 kg (184 lb 8.4 oz), SpO2 97 %. No results found. No results for input(s): WBC, HGB, HCT, PLT in the last 72 hours.  Recent Labs  01/31/17 0547  NA 138  K 4.6  CL 106  GLUCOSE 117*  BUN 34*  CREATININE 1.23  CALCIUM 8.4*   CBG (last 3)  No results for input(s): GLUCAP in the last 72 hours.  Wt Readings from Last 3 Encounters:  01/30/17 83.7 kg (184 lb 8.4 oz)  01/24/17 89.8 kg (197 lb 15.6 oz)  01/21/17 90 kg (198 lb 8 oz)    Physical Exam:  Constitutional: He appears well-developed and well-nourished.  HENT:  Head: Normocephalic and atraumatic.  Eyes: EOMI.  Neck:  Cervical collar in place and fitting Cardiovascular: RRR  Respiratory: CTA B GI: Soft. Bowel sounds are normal. He exhibits no distension.  Musculoskeletal: He exhibits edema. He exhibits no tenderness.  Neurological: He is alert.  Sensation diminished to light touch in shoulders, hands, feet---inconsistent Hoarse voice but quite intelligible.  Motor: B/l LE: 4-4+/5 proximal to distal B/L UE: Shoulder abduction 3- to 3/5, 2+5 on left, Right elbow flexion/extension 4/5, hand 4/5 with. Left biceps 3+/5. Left triceps 4/5. HI 4-5/5. --stable Skin:  Wound c/d/i Psychiatric: anxious  Assessment/Plan: 1. Debility and functional deficits secondary to cervical spondylosis/radiculopathy (s/p C4-7 ACDF, C3-6 PCF)  which require 3+ hours per day of interdisciplinary therapy in a comprehensive inpatient rehab setting. Physiatrist is providing close team supervision and 24 hour management of active medical  problems listed below. Physiatrist and rehab team continue to assess barriers to discharge/monitor patient progress toward functional and medical goals.  Function:  Bathing Bathing position   Position: Shower  Bathing parts Body parts bathed by patient: Chest, Abdomen, Front perineal area, Right upper leg, Left upper leg Body parts bathed by helper: Right arm, Left arm, Buttocks, Right lower leg, Left lower leg, Back  Bathing assist Assist Level:  (mod A)      Upper Body Dressing/Undressing Upper body dressing   What is the patient wearing?: Pull over shirt/dress     Pull over shirt/dress - Perfomed by patient: Thread/unthread right sleeve, Thread/unthread left sleeve Pull over shirt/dress - Perfomed by helper: Put head through opening, Pull shirt over trunk        Upper body assist Assist Level:  (max A)      Lower Body Dressing/Undressing Lower body dressing   What is the patient wearing?: Pants, Non-skid slipper socks     Pants- Performed by patient: Thread/unthread right pants leg Pants- Performed by helper: Thread/unthread left pants leg, Pull pants up/down   Non-skid slipper socks- Performed by helper: Don/doff right sock, Don/doff left sock                  Lower body assist Assist for lower body dressing:  (total assist)      Toileting Toileting   Toileting steps completed by patient: Adjust clothing prior to toileting, Adjust clothing after toileting Toileting steps completed by helper: Performs perineal hygiene Toileting Assistive Devices: Grab bar or rail  Toileting assist Assist level:  Touching or steadying assistance (Pt.75%)   Transfers Chair/bed transfer   Chair/bed transfer method: Ambulatory Chair/bed transfer assist level: Supervision or verbal cues Chair/bed transfer assistive device: Walker, Designer, fashion/clothing     Max distance: 150 Assist level: Supervision or verbal cues   Wheelchair   Type: Manual Max wheelchair  distance: 20 Assist Level: Moderate assistance (Pt 50 - 74%)  Cognition Comprehension Comprehension assist level: Follows complex conversation/direction with no assist  Expression Expression assist level: Expresses basic needs/ideas: With no assist  Social Interaction Social Interaction assist level: Interacts appropriately 75 - 89% of the time - Needs redirection for appropriate language or to initiate interaction.  Problem Solving Problem solving assist level: Solves basic problems with no assist  Memory Memory assist level: Recognizes or recalls 90% of the time/requires cueing < 10% of the time   Medical Problem List and Plan: 1.  Debilitation with neck and shoulder pain secondary to cervical spondylosis with radiculopathy. Status post C4-7 anterior discectomy and fusion, posterior cervical fusion C3-C6. Cervical collar all times  -continue PT, OT 2.  DVT Prophylaxis/Anticoagulation: SCDs.  Dopplers negative 3. Pain Management: OxyContin sustained release 30 mg every 12 hours, Neurontin 600 mg 3 times a day, Oxycodone, Robaxin as needed  -pain seems to be improving.   -he is on a substantial amount of medication to treat his pain on different levels 4. Mood: Provide emotional support 5. Neuropsych: This patient is capable of making decisions on his own behalf. 6. Skin/Wound Care: Routine skin checks 7. Fluids/Electrolytes/Nutrition:    -BUN sl improved today 34. Cr decreased also  -continue to push fluids and recheck monday 8. Acute blood loss anemia. Follow-up CBC with hgb 9.1   9. CAD with history of MI. Patient on aspirin and Brilinta prior to admission. 10. Diet-controlled diabetes mellitus.. Diabetic teaching. Hemoglobin A1c 6.2 11. CKD stage III. Mild ARI,  Creatinine baseline 1.43-1.54  -encourage PO fluids  -Cr 1.23---recheck monday 12. History of CVA without residual. Discuss resuming aspirin. 13. Leukocytosis: WBC's trending up  -afebrile without any clinical signs of  infection  -likely steroid related--continue steroid wean  -continue to monitor      LOS (Days) 4 A FACE TO FACE EVALUATION WAS PERFORMED  Ranelle Oyster, MD 02/01/2017 8:42 AM

## 2017-02-01 NOTE — Progress Notes (Signed)
Physical Therapy Session Note  Patient Details  Name: Russell Carter MRN: [id number] Date of Birth: [birth year]  Today's Date: 02/01/2017 PT Individual Time: 0815-1000 PT Individual Time Calculation (min): 105 min   Short Term Goals: Week 1:  PT Short Term Goal 1 (Week 1): Pt will ambulate 150' RW and min guard PT Short Term Goal 2 (Week 1): Pt will ascend/descent 1 flight stairs 6" height with 1 handrail min guard PT Short Term Goal 3 (Week 1): Pt will demonstrate dynamic standing balance min guard x10 min  Skilled Therapeutic Interventions/Progress Updates: Pt received supine in bed, c/o pain 3/10 in neck with RN present to administer medication, and agreeable to treatment. Supine>sit modI with HOB elevated. Gait to gym x150' with RW and S; min cues for increased gait speed. Supine lying on tennis balls in mid traps for pain relief. Performed LE exercises including straight leg raises, heel slides. Gait training outdoors on level/unlevel surfaces with RW and without AD; min guard overall and min cues for managing RW over curb. Sit <>stand 2x10 reps no UE support for strengthening and endurance. Returned to room totalA d/t bowel/bladder urgency. Ambulated in/out of bathroom no AD and close S. Required several minutes sitting on toilet before having bowel movement. Required assist for peri hygiene and to pull pants up. Standing at sink pt performed handwashing with setupA. Supine lying x5 min on tennis balls again per pt request due to increased pain. Standing kinetron 2x3 min for weight shifting, weight bearing, strengthening and endurance. Returned to room without AD and close S. Remained supine in bed at end of session, alarm intact and all needs in reach.      Therapy Documentation Precautions:  Precautions Precautions: Cervical, Fall Required Braces or Orthoses: Cervical Brace Cervical Brace: Hard collar Restrictions Weight Bearing Restrictions: No Pain: Pain Assessment Pain Assessment:  0-10 Pain Score: 3  Pain Type: Acute pain Pain Location: Neck Pain Orientation: Posterior Pain Descriptors / Indicators: Aching Pain Frequency: Intermittent Pain Onset: On-going Patients Stated Pain Goal: 2 Pain Intervention(s): Medication (See eMAR);Repositioned Multiple Pain Sites: No   See Function Navigator for Current Functional Status.   Therapy/Group: Individual Therapy  Vista Lawman 02/01/2017, 12:09 PM

## 2017-02-02 ENCOUNTER — Inpatient Hospital Stay (HOSPITAL_COMMUNITY): Payer: Medicaid Other | Admitting: Physical Therapy

## 2017-02-02 ENCOUNTER — Inpatient Hospital Stay (HOSPITAL_COMMUNITY): Payer: Medicaid Other

## 2017-02-02 NOTE — Progress Notes (Signed)
Occupational Therapy Session Note  Patient Details  Name: Russell Carter MRN: [id number] Date of Birth: 02/11/1956  Today's Date: 02/02/2017 OT Individual Time: 1130-1155 OT Individual Time Calculation (min): 25 min    Short Term Goals: Week 1:  OT Short Term Goal 1 (Week 1): Pt will perform LB clothing management during toileting with steady assistance. OT Short Term Goal 2 (Week 1): Pt will perform LB dressing with mod A in order to increase I with self care. OT Short Term Goal 3 (Week 1): Pt will engage in 15 minutes of functional tasks with 2 rest breaks or less during this time.  OT Short Term Goal 4 (Week 1): Pt will perform UB dressing with mod A in order to increase I with self care tasks.  Skilled Therapeutic Interventions/Progress Updates:    1:1 4/10 pain reported in back but deny wanting medication. session focus on ambulation without AD, and functional use of BUE during laundry. Throughout session pt requires VC and AAROM of BUE to decrease compensatory movements with trunk flexion/extension. Pt gathers clothes and ambulates into day room to sit and write name on pt laundry bag with VC to use LUE as stabilizer. Pt ambulates with laundry to laundry room to place clothes 1 at a time into washer from low bag below waist height. Pt uses LUE to slide belt out of pants loop with increased time and HOH A from OT. Pt reaches to grab laundry detergent, place in washing machine, and close/set washing machine at chest height. Pt ambulates back to room with supervision and lies supine with HOB elevated. Exited session with bed exit alarm on and call light in reach.   Therapy Documentation Precautions:  Precautions Precautions: Cervical, Fall Required Braces or Orthoses: Cervical Brace Cervical Brace: Hard collar Restrictions Weight Bearing Restrictions: No General:  See Function Navigator for Current Functional Status.   Therapy/Group: Individual Therapy  Shon Hale 02/02/2017, 11:57 AM

## 2017-02-02 NOTE — Progress Notes (Addendum)
Occupational Therapy Session Note  Patient Details  Name: Russell Carter MRN: [id number] Date of Birth: Feb 27, 1956  Today's Date: 02/02/2017 OT Individual Time:  700- 758 58 min    Short Term Goals: Week 1:  OT Short Term Goal 1 (Week 1): Pt will perform LB clothing management during toileting with steady assistance. OT Short Term Goal 2 (Week 1): Pt will perform LB dressing with mod A in order to increase I with self care. OT Short Term Goal 3 (Week 1): Pt will engage in 15 minutes of functional tasks with 2 rest breaks or less during this time.  OT Short Term Goal 4 (Week 1): Pt will perform UB dressing with mod A in order to increase I with self care tasks.  Skilled Therapeutic Interventions/Progress Updates:    Addendum: 1:1. Pain 7/10 reported. RN administering medication. Pt ambulates with CGA and no AD to bathroom to void bowel with increased time. Pt completes clothing management with A for hygiene only. Pt transfer onto TTB in shower to bathe with A for washing B shoulders, B lower legs and buttocks d/t decreased AROM of BUEs. OT provides HOH A to wash B shoulders and achieve full ROM. While seated at EOB, Pt dons pull over shirt with A to thread head through opening and pull shirt around trunk. Pt able to thread RLE into pant leg, but require A to thread LLE. Pt sit to stand with supervision and advances pants over hips VC for hand placement on waisteband. Pt dons B socks with VC to place foot on stool to elevate foot. With pt seated EOB, Educated pt to raise bed up to allow elbow stabilization on table to support hand to mouth excursion. Pt verbalized understanding. Exited session with pt seated EOB and bed exit alarm on while NT brings breakfast tray.   Therapy Documentation Precautions:  Precautions Precautions: Cervical, Fall Required Braces or Orthoses: Cervical Brace Cervical Brace: Hard collar Restrictions Weight Bearing Restrictions: No General: See Function Navigator for  Current Functional Status.   Therapy/Group: Individual Therapy  Shon Hale 02/02/2017, 7:13 AM

## 2017-02-02 NOTE — Progress Notes (Signed)
Physical Therapy Session Note  Patient Details  Name: Russell Carter MRN: [id number] Date of Birth: 1956/05/29  Today's Date: 02/02/2017 PT Individual Time: 1005-1100 and 1300-1345 PT Individual Time Calculation (min): 55 min  And 45 min  Short Term Goals: Week 1:  PT Short Term Goal 1 (Week 1): Pt will ambulate 150' RW and min guard PT Short Term Goal 2 (Week 1): Pt will ascend/descent 1 flight stairs 6" height with 1 handrail min guard PT Short Term Goal 3 (Week 1): Pt will demonstrate dynamic standing balance min guard x10 min  Skilled Therapeutic Interventions/Progress Updates: Tx1: Pt presented in bed agreeablre to therapy. Performed supine to sit with supervision and use of features. Ambulated to toilet with min guard and RW, demonstrating fair (+) balance while voiding. Pt ambulated throughout hospital campus with RW and min guard on compliant and non-compliant surfaces.  Cues for occasional R foot clearance and monitoring thresholds. Pt with x2 occurances of poor clearance over thresholds with near LOB however pt was able to correct. Performed TPR to periscapular ms for pain management. Performed standing balance activities outside for safety with functional mobilty. Pt returned to room and returned to bed in same manner as previous. Pt left in bed with alarm on and current needs met.   Tx2: Pt presented in bed agreeable to therapy. Performed supine to sit with supervision and use of features. Transferred to w/c stand pivot min guard. Performed seated and supine therex outside with 2# weights including LAQ with 3 sec hold, hip flexion, hip abd/add, standing SLR, and heel raises. Pt stating becoming light headed and nauseous after standing therex. Pt returned to room and transferred to bed. Vitals checked 139/90 HR 97 nsg notified. Pt stating no decrease in symptoms in supine position. Nsg aware.      Therapy Documentation Precautions:  Precautions Precautions: Cervical, Fall Required  Braces or Orthoses: Cervical Brace Cervical Brace: Hard collar Restrictions Weight Bearing Restrictions: No      See Function Navigator for Current Functional Status.   Therapy/Group: Individual Therapy  Japleen Tornow  Kurk Corniel, PTA  02/02/2017, 12:28 PM

## 2017-02-02 NOTE — Progress Notes (Signed)
[facility] PHYSICAL MEDICINE & REHABILITATION     PROGRESS NOTE    Subjective/Complaints: Had a good day with therapy. Pain seems to be generally improving  ROS: pt denies nausea, vomiting, diarrhea, cough, shortness of breath or chest pain    Objective: Vital Signs: Blood pressure 123/78, pulse 62, temperature 98.2 F (36.8 C), temperature source Oral, resp. rate 18, height 5\' 11"  (1.803 m), weight 83.7 kg (184 lb 8.4 oz), SpO2 97 %. No results found. No results for input(s): WBC, HGB, HCT, PLT in the last 72 hours.  Recent Labs  01/31/17 0547  NA 138  K 4.6  CL 106  GLUCOSE 117*  BUN 34*  CREATININE 1.23  CALCIUM 8.4*   CBG (last 3)  No results for input(s): GLUCAP in the last 72 hours.  Wt Readings from Last 3 Encounters:  01/30/17 83.7 kg (184 lb 8.4 oz)  01/24/17 89.8 kg (197 lb 15.6 oz)  01/21/17 90 kg (198 lb 8 oz)    Physical Exam:  Constitutional: He appears well-developed and well-nourished.  HENT:  Head: Normocephalic and atraumatic.  Eyes: EOMI.  Neck:  Cervical collar in place  Cardiovascular: RRR  Respiratory: CTA B GI: Soft. Bowel sounds are normal. He exhibits no distension.  Musculoskeletal: He exhibits edema. He exhibits no tenderness.  Neurological: He is alert.  Sensation diminished to light touch in shoulders, hands, feet---inconsistent Hoarse voice but quite intelligible.  Motor: B/l LE: 4-4+/5 proximal to distal B/L UE: Shoulder abduction 3- to 3/5, 2+5 on left, Right elbow flexion/extension 4/5, hand 4/5 with. Left biceps 3+/5. Left triceps 4/5. HI 4-5/5. --no changes in motor exam Skin:  Wound c/d/i Psychiatric: anxious  Assessment/Plan: 1. Debility and functional deficits secondary to cervical spondylosis/radiculopathy (s/p C4-7 ACDF, C3-6 PCF)  which require 3+ hours per day of interdisciplinary therapy in a comprehensive inpatient rehab setting. Physiatrist is providing close team supervision and 24 hour management of active  medical problems listed below. Physiatrist and rehab team continue to assess barriers to discharge/monitor patient progress toward functional and medical goals.  Function:  Bathing Bathing position   Position: Shower  Bathing parts Body parts bathed by patient: Chest, Abdomen, Front perineal area, Right upper leg, Left upper leg Body parts bathed by helper: Right arm, Left arm, Buttocks, Right lower leg, Left lower leg, Back  Bathing assist Assist Level:  (mod A)      Upper Body Dressing/Undressing Upper body dressing   What is the patient wearing?: Pull over shirt/dress     Pull over shirt/dress - Perfomed by patient: Thread/unthread right sleeve, Thread/unthread left sleeve Pull over shirt/dress - Perfomed by helper: Put head through opening, Pull shirt over trunk        Upper body assist Assist Level:  (max A)      Lower Body Dressing/Undressing Lower body dressing   What is the patient wearing?: Pants, Non-skid slipper socks     Pants- Performed by patient: Thread/unthread right pants leg Pants- Performed by helper: Thread/unthread left pants leg, Pull pants up/down   Non-skid slipper socks- Performed by helper: Don/doff right sock, Don/doff left sock                  Lower body assist Assist for lower body dressing:  (total assist)      Toileting Toileting   Toileting steps completed by patient: Adjust clothing prior to toileting Toileting steps completed by helper: Performs perineal hygiene, Adjust clothing after toileting Toileting Assistive Devices: Grab bar or  rail  Toileting assist Assist level: Touching or steadying assistance (Pt.75%)   Transfers Chair/bed transfer   Chair/bed transfer method: Ambulatory Chair/bed transfer assist level: Supervision or verbal cues Chair/bed transfer assistive device: Armrests     Locomotion Ambulation     Max distance: 175 Assist level: Supervision or verbal cues   Wheelchair   Type: Manual Max wheelchair  distance: 20 Assist Level: Moderate assistance (Pt 50 - 74%)  Cognition Comprehension Comprehension assist level: Understands complex 90% of the time/cues 10% of the time  Expression Expression assist level: Expresses complex 90% of the time/cues < 10% of the time  Social Interaction Social Interaction assist level: Interacts appropriately 90% of the time - Needs monitoring or encouragement for participation or interaction.  Problem Solving Problem solving assist level: Solves complex 90% of the time/cues < 10% of the time  Memory Memory assist level: Recognizes or recalls 90% of the time/requires cueing < 10% of the time   Medical Problem List and Plan: 1.  Debilitation with neck and shoulder pain secondary to cervical spondylosis with radiculopathy. Status post C4-7 anterior discectomy and fusion, posterior cervical fusion C3-C6. Cervical collar all times  -continue PT, OT--pt trying to work through kids 2.  DVT Prophylaxis/Anticoagulation: SCDs.  Dopplers negative 3. Pain Management: OxyContin sustained release 30 mg every 12 hours, Neurontin 600 mg 3 times a day, Oxycodone, Robaxin as needed  -pain seems to be improving.  4. Mood: Provide emotional support 5. Neuropsych: This patient is capable of making decisions on his own behalf. 6. Skin/Wound Care: Routine skin checks 7. Fluids/Electrolytes/Nutrition:    -BUN sl improved today 34. Cr decreased also  -continue to push fluids and recheck labs monday 8. Acute blood loss anemia. Follow-up CBC with hgb 9.1   9. CAD with history of MI. Patient on aspirin and Brilinta prior to admission. 10. Diet-controlled diabetes mellitus.. Diabetic teaching. Hemoglobin A1c 6.2 11. CKD stage III. Mild ARI,  Creatinine baseline 1.43-1.54  -encourage PO fluids  -Cr 1.23---recheck monday 12. History of CVA without residual. Discuss resuming aspirin. 13. Leukocytosis: WBC's trending up  -afebrile without any clinical signs of infection  -likely steroid  related--continue steroid wean  -continue to monitor      LOS (Days) 5 A FACE TO FACE EVALUATION WAS PERFORMED  Ranelle Oyster, MD 02/02/2017 8:39 AM

## 2017-02-03 ENCOUNTER — Inpatient Hospital Stay (HOSPITAL_COMMUNITY): Payer: Medicaid Other

## 2017-02-03 NOTE — Progress Notes (Signed)
[facility] PHYSICAL MEDICINE & REHABILITATION     PROGRESS NOTE    Subjective/Complaints: No new problems  ROS: pt denies nausea, vomiting, diarrhea, cough, shortness of breath or chest pain    Objective: Vital Signs: Blood pressure 134/78, pulse 60, temperature 98.4 F (36.9 C), temperature source Oral, resp. rate 18, height 5\' 11"  (1.803 m), weight 83.7 kg (184 lb 8.4 oz), SpO2 98 %. No results found. No results for input(s): WBC, HGB, HCT, PLT in the last 72 hours. No results for input(s): NA, K, CL, GLUCOSE, BUN, CREATININE, CALCIUM in the last 72 hours.  Invalid input(s): CO CBG (last 3)  No results for input(s): GLUCAP in the last 72 hours.  Wt Readings from Last 3 Encounters:  01/30/17 83.7 kg (184 lb 8.4 oz)  01/24/17 89.8 kg (197 lb 15.6 oz)  01/21/17 90 kg (198 lb 8 oz)    Physical Exam:  Constitutional: He appears well-developed and well-nourished.  HENT:  Head: Normocephalic and atraumatic.  Eyes: EOMI.  Neck:  Cervical collar in place  Cardiovascular: RRR  Respiratory: CTA B GI: Soft. Bowel sounds are normal. He exhibits no distension.  Musculoskeletal: He exhibits edema. He exhibits no tenderness.  Neurological: He is alert.  Sensation diminished to light touch in shoulders, hands, feet---inconsistent Hoarse voice but quite intelligible.  Motor: B/l LE: 4-4+/5 proximal to distal B/L UE: Shoulder abduction 3- to 3/5, 2+5 on left, Right elbow flexion/extension 4/5, hand 4/5 with. Left biceps 3+/5. Left triceps 4/5. HI 4-5/5. --no changes in motor exam Skin:  Wound c/d/i Psychiatric: anxious  Assessment/Plan: 1. Debility and functional deficits secondary to cervical spondylosis/radiculopathy (s/p C4-7 ACDF, C3-6 PCF)  which require 3+ hours per day of interdisciplinary therapy in a comprehensive inpatient rehab setting. Physiatrist is providing close team supervision and 24 hour management of active medical problems listed below. Physiatrist and rehab  team continue to assess barriers to discharge/monitor patient progress toward functional and medical goals.  Function:  Bathing Bathing position   Position: Shower  Bathing parts Body parts bathed by patient: Chest, Abdomen, Front perineal area, Right upper leg, Left upper leg Body parts bathed by helper: Right arm, Left arm, Buttocks, Right lower leg, Left lower leg, Back  Bathing assist Assist Level:  (MOD A)      Upper Body Dressing/Undressing Upper body dressing   What is the patient wearing?: Pull over shirt/dress     Pull over shirt/dress - Perfomed by patient: Thread/unthread right sleeve, Thread/unthread left sleeve Pull over shirt/dress - Perfomed by helper: Put head through opening, Pull shirt over trunk        Upper body assist Assist Level:  (MOD A)      Lower Body Dressing/Undressing Lower body dressing   What is the patient wearing?: Pants, Non-skid slipper socks     Pants- Performed by patient: Thread/unthread right pants leg, Pull pants up/down Pants- Performed by helper: Thread/unthread left pants leg Non-skid slipper socks- Performed by patient: Don/doff right sock, Don/doff left sock (foot on stool) Non-skid slipper socks- Performed by helper: Don/doff right sock, Don/doff left sock                  Lower body assist Assist for lower body dressing:  (MOD A)      Toileting Toileting   Toileting steps completed by patient: Adjust clothing prior to toileting, Performs perineal hygiene, Adjust clothing after toileting Toileting steps completed by helper: Performs perineal hygiene, Adjust clothing after toileting Toileting Assistive Devices: Grab  bar or rail  Toileting assist Assist level: Touching or steadying assistance (Pt.75%)   Transfers Chair/bed transfer   Chair/bed transfer method: Ambulatory Chair/bed transfer assist level: Supervision or verbal cues Chair/bed transfer assistive device: Armrests     Locomotion Ambulation     Max  distance: 175 Assist level: Supervision or verbal cues   Wheelchair   Type: Manual Max wheelchair distance: 20 Assist Level: Moderate assistance (Pt 50 - 74%)  Cognition Comprehension Comprehension assist level: Understands basic 90% of the time/cues < 10% of the time  Expression Expression assist level: Expresses basic 90% of the time/requires cueing < 10% of the time.  Social Interaction Social Interaction assist level: Interacts appropriately 90% of the time - Needs monitoring or encouragement for participation or interaction.  Problem Solving Problem solving assist level: Solves basic 90% of the time/requires cueing < 10% of the time  Memory Memory assist level: Recognizes or recalls 90% of the time/requires cueing < 10% of the time   Medical Problem List and Plan: 1.  Debilitation with neck and shoulder pain secondary to cervical spondylosis with radiculopathy. Status post C4-7 anterior discectomy and fusion, posterior cervical fusion C3-C6. Cervical collar all times  -continue PT, OT-  2.  DVT Prophylaxis/Anticoagulation: SCDs.  Dopplers negative 3. Pain Management: OxyContin sustained release 30 mg every 12 hours, Neurontin 600 mg 3 times a day, Oxycodone, Robaxin as needed  -pain seems to be improving.  4. Mood: Provide emotional support 5. Neuropsych: This patient is capable of making decisions on his own behalf. 6. Skin/Wound Care: Routine skin checks 7. Fluids/Electrolytes/Nutrition:    -BUN sl improved to 34. Cr decreased also  -continue to push fluids and recheck labs monday 8. Acute blood loss anemia. Follow-up CBC with hgb 9.1   9. CAD with history of MI. Patient on aspirin and Brilinta prior to admission. 10. Diet-controlled diabetes mellitus.. Diabetic teaching. Hemoglobin A1c 6.2 11. CKD stage III. Mild ARI,  Creatinine baseline 1.43-1.54  -encourage PO fluids  -Cr 1.23---recheck monday 12. History of CVA without residual. Discuss resuming aspirin. 13.  Leukocytosis: WBC's trending up  -afebrile without any clinical signs of infection  -likely steroid related--continue steroid wean  -continue to monitor      LOS (Days) 6 A FACE TO FACE EVALUATION WAS PERFORMED  Ranelle Oyster, MD 02/03/2017 9:11 AM

## 2017-02-03 NOTE — Progress Notes (Addendum)
Occupational Therapy Session Note  Patient Details  Name: Russell Carter MRN: [id number] Date of Birth: February 03, 1956  Today's Date: 02/03/2017 OT Individual Time: 4497-5300 OT Individual Time Calculation (min): 30 min    Short Term Goals: Week 1:  OT Short Term Goal 1 (Week 1): Pt will perform LB clothing management during toileting with steady assistance. OT Short Term Goal 2 (Week 1): Pt will perform LB dressing with mod A in order to increase I with self care. OT Short Term Goal 3 (Week 1): Pt will engage in 15 minutes of functional tasks with 2 rest breaks or less during this time.  OT Short Term Goal 4 (Week 1): Pt will perform UB dressing with mod A in order to increase I with self care tasks.  Skilled Therapeutic Interventions/Progress Updates:    Addendum: 1:1 session, Focus on functional mobility and functional use of BUE in laundry activity. Pt ambulates to toilet and stand to void urine. Pt able to complete clothing management prior to/after voiding. Pt requests to walk long way to laundry room since this was first therapy of the day. Pt ambulates without AD throughout session with supervision and Vc for pathfinding. Pt unloads front load dryer and places clothing in laundry bag with RUE. In room, pt sorts clothes into pile. OT folds laundry and provides HOH A to facilitate normal movement for reaching for clothes and placing into dresser drawers. Exited session with pt supine in bed with call light in reach, bed exit alarm on and all needs met.   Therapy Documentation Precautions:  Precautions Precautions: Cervical, Fall Required Braces or Orthoses: Cervical Brace Cervical Brace: Hard collar Restrictions Weight Bearing Restrictions: No General:   Vital Signs: Therapy Vitals Temp: 98.6 F (37 C) Temp Source: Oral Pulse Rate: 69 Resp: 18 BP: 129/77 Patient Position (if appropriate): Lying Oxygen Therapy SpO2: 98 % O2 Device: Not Delivered Pain: Pain Assessment Pain  Assessment: 0-10 Pain Score: 0-No pain Pain Type: Acute pain Pain Location: Neck Pain Descriptors / Indicators: Aching Pain Frequency: Constant Pain Onset: On-going Pain Intervention(s): Medication (See eMAR) ADL:   Exercises:   Other Treatments:    See Function Navigator for Current Functional Status.   Therapy/Group: Individual Therapy  Tonny Branch 02/03/2017, 7:04 PM

## 2017-02-04 ENCOUNTER — Inpatient Hospital Stay (HOSPITAL_COMMUNITY): Payer: Medicaid Other | Admitting: Physical Therapy

## 2017-02-04 ENCOUNTER — Inpatient Hospital Stay (HOSPITAL_COMMUNITY): Payer: Medicaid Other

## 2017-02-04 ENCOUNTER — Inpatient Hospital Stay (HOSPITAL_COMMUNITY): Payer: Medicaid Other | Admitting: Occupational Therapy

## 2017-02-04 LAB — BASIC METABOLIC PANEL
ANION GAP: 8 (ref 5–15)
BUN: 19 mg/dL (ref 6–20)
CO2: 28 mmol/L (ref 22–32)
Calcium: 8.4 mg/dL — ABNORMAL LOW (ref 8.9–10.3)
Chloride: 102 mmol/L (ref 101–111)
Creatinine, Ser: 1.13 mg/dL (ref 0.61–1.24)
GFR calc Af Amer: >60 mL/min (ref >60)
Glucose, Bld: 106 mg/dL — ABNORMAL HIGH (ref 65–99)
POTASSIUM: 4.7 mmol/L (ref 3.5–5.1)
Sodium: 138 mmol/L (ref 135–145)

## 2017-02-04 MED ORDER — TICAGRELOR 90 MG PO TABS
90.0000 mg | ORAL_TABLET | Freq: Two times a day (BID) | ORAL | Status: DC
  Administered 2017-02-04 – 2017-02-12 (×17): 90 mg via ORAL
  Filled 2017-02-04 (×19): qty 1

## 2017-02-04 MED ORDER — GABAPENTIN 400 MG PO CAPS
800.0000 mg | ORAL_CAPSULE | Freq: Three times a day (TID) | ORAL | Status: DC
  Administered 2017-02-04 – 2017-02-12 (×25): 800 mg via ORAL
  Filled 2017-02-04 (×27): qty 2

## 2017-02-04 MED ORDER — DEXAMETHASONE 2 MG PO TABS
2.0000 mg | ORAL_TABLET | Freq: Two times a day (BID) | ORAL | Status: DC
  Administered 2017-02-04 – 2017-02-08 (×8): 2 mg via ORAL
  Filled 2017-02-04 (×9): qty 1

## 2017-02-04 NOTE — Progress Notes (Signed)
[facility] PHYSICAL MEDICINE & REHABILITATION     PROGRESS NOTE    Subjective/Complaints: Didn't sleep well. Couldn't get real comfortable. Both arms hurt more today  ROS: pt denies nausea, vomiting, diarrhea, cough, shortness of breath or chest pain    Objective: Vital Signs: Blood pressure 126/70, pulse 73, temperature 98.3 F (36.8 C), temperature source Oral, resp. rate 17, height 5\' 11"  (1.803 m), weight 83.7 kg (184 lb 8.4 oz), SpO2 99 %. No results found. No results for input(s): WBC, HGB, HCT, PLT in the last 72 hours.  Recent Labs  02/04/17 0602  NA 138  K 4.7  CL 102  GLUCOSE 106*  BUN 19  CREATININE 1.13  CALCIUM 8.4*   CBG (last 3)  No results for input(s): GLUCAP in the last 72 hours.  Wt Readings from Last 3 Encounters:  01/30/17 83.7 kg (184 lb 8.4 oz)  01/24/17 89.8 kg (197 lb 15.6 oz)  01/21/17 90 kg (198 lb 8 oz)    Physical Exam:  Constitutional: appears uncomfortable  HENT:  Head: Normocephalic and atraumatic.  Eyes: EOMI.  Neck:  Cervical collar in place  Cardiovascular: RRR  Respiratory: CTA B GI: Soft. Bowel sounds are normal. He exhibits no distension.  Musculoskeletal: arm and neck tenderness with ROM  Neurological: He is alert.  Sensation diminished to light touch in shoulders, hands, feet---inconsistent Motor: B/l LE: 4-4+/5 proximal to distal B/L UE: Shoulder abduction 3- to 3/5, 2+5 on left, Right elbow flexion/extension 4/5, hand 4/5 with. Left biceps 3+/5. Left triceps 4/5. HI 4-5/5. --motor exam a half to full grade decreased due to pain in neck/shoulders.  Skin:  Wound remains c/d/i Psychiatric: appropriate  Assessment/Plan: 1. Debility and functional deficits secondary to cervical spondylosis/radiculopathy (s/p C4-7 ACDF, C3-6 PCF)  which require 3+ hours per day of interdisciplinary therapy in a comprehensive inpatient rehab setting. Physiatrist is providing close team supervision and 24 hour management of active medical  problems listed below. Physiatrist and rehab team continue to assess barriers to discharge/monitor patient progress toward functional and medical goals.  Function:  Bathing Bathing position   Position: Shower  Bathing parts Body parts bathed by patient: Chest, Abdomen, Front perineal area, Right upper leg, Left upper leg Body parts bathed by helper: Right arm, Left arm, Buttocks, Right lower leg, Left lower leg, Back  Bathing assist Assist Level:  (MOD A)      Upper Body Dressing/Undressing Upper body dressing   What is the patient wearing?: Pull over shirt/dress     Pull over shirt/dress - Perfomed by patient: Thread/unthread right sleeve, Thread/unthread left sleeve Pull over shirt/dress - Perfomed by helper: Put head through opening, Pull shirt over trunk        Upper body assist Assist Level:  (MOD A)      Lower Body Dressing/Undressing Lower body dressing   What is the patient wearing?: Pants, Non-skid slipper socks     Pants- Performed by patient: Thread/unthread right pants leg, Pull pants up/down Pants- Performed by helper: Thread/unthread left pants leg Non-skid slipper socks- Performed by patient: Don/doff right sock, Don/doff left sock (foot on stool) Non-skid slipper socks- Performed by helper: Don/doff right sock, Don/doff left sock                  Lower body assist Assist for lower body dressing:  (MOD A)      Toileting Toileting   Toileting steps completed by patient: Adjust clothing prior to toileting, Performs perineal hygiene, Adjust clothing  after toileting Toileting steps completed by helper: Performs perineal hygiene, Adjust clothing after toileting Toileting Assistive Devices: Grab bar or rail  Toileting assist Assist level: Touching or steadying assistance (Pt.75%)   Transfers Chair/bed transfer   Chair/bed transfer method: Ambulatory Chair/bed transfer assist level: Supervision or verbal cues Chair/bed transfer assistive device:  Armrests     Locomotion Ambulation     Max distance: 175 Assist level: Supervision or verbal cues   Wheelchair   Type: Manual Max wheelchair distance: 20 Assist Level: Moderate assistance (Pt 50 - 74%)  Cognition Comprehension Comprehension assist level: Follows basic conversation/direction with no assist  Expression Expression assist level: Expresses basic needs/ideas: With no assist  Social Interaction Social Interaction assist level: Interacts appropriately 90% of the time - Needs monitoring or encouragement for participation or interaction.  Problem Solving Problem solving assist level: Solves basic problems with no assist  Memory Memory assist level: Recognizes or recalls 90% of the time/requires cueing < 10% of the time   Medical Problem List and Plan: 1.  Debilitation with neck and shoulder pain secondary to cervical spondylosis with radiculopathy. Status post C4-7 anterior discectomy and fusion, posterior cervical fusion C3-C6. Cervical collar all times  -continue PT, OT-  2.  DVT Prophylaxis/Anticoagulation: SCDs.  Dopplers negative 3. Pain Management: OxyContin sustained release 30 mg every 12 hours, Neurontin 600 mg 3 times a day, Oxycodone, Robaxin as needed  -pain seems to be improving.   -discussed with the patient that he may have fluctuating pain depending on positions, physical exertion, etc  -steroid taper may be affecting also  -neuro exam otherwise stable  -will go ahead and increase gabapentin to 800mg  TID 4. Mood: Provide emotional support 5. Neuropsych: This patient is capable of making decisions on his own behalf. 6. Skin/Wound Care: Routine skin checks 7. Fluids/Electrolytes/Nutrition:    -BUN much improved--19 today  -continue to encourage fluids 8. Acute blood loss anemia. Follow-up CBC with hgb 9.1   9. CAD with history of MI. Patient on aspirin and Brilinta prior to admission. 10. Diet-controlled diabetes mellitus.. Diabetic teaching. Hemoglobin A1c  6.2 11. CKD stage III. Mild ARI,  Creatinine baseline 1.43-1.54  -encourage PO fluids  -Cr 1.13--today 12. History of CVA without residual. Discuss resuming aspirin. 13. Leukocytosis: WBC's trending up  -afebrile without any clinical signs of infection  -likely steroid related-- steroid wean to 2mg  q12  -continue to monitor      LOS (Days) 7 A FACE TO FACE EVALUATION WAS PERFORMED  Ranelle Oyster, MD 02/04/2017 9:06 AM

## 2017-02-04 NOTE — Progress Notes (Signed)
Physical Therapy Note  Patient Details  Name: Russell Carter MRN: [id number] Date of Birth: 05-22-1956 Today's Date: 02/04/2017  2633-3545, 60 min individual tx Pain: 3/10 L shoulder and neck  Gait room to gym with RW on level tile, supervision.  NuStep for activity tolerance, at level 7 , x 8 minutes, rated 12 on Borg scale.  Balance retraining /prolonged stretching bil heel cords x 2.5 minutes, with bil hand support, fading to 0 support. Balance challenge standing on compliant mat x 2 minutes with bil support fading to 0 support. bil UE strengthening with wall pushups, PT holding weight of UEs at elbows; pt performed 7 before needing to rest, seated. Gait to return to room; pt needs VCs to use RW safely.   Pt left resting in bed with alarm set and all needs within reach.  See function navigator for current status.   Branston Halsted 02/04/2017, 11:23 AM

## 2017-02-04 NOTE — Progress Notes (Signed)
Occupational Therapy Session Note  Patient Details  Name: Russell Carter MRN: [id number] Date of Birth: [birth year]  Today's Date: 02/04/2017 OT Individual Time: 1301-1430 OT Individual Time Calculation (min): 89 min    Short Term Goals: Week 1:  OT Short Term Goal 1 (Week 1): Pt will perform LB clothing management during toileting with steady assistance. OT Short Term Goal 2 (Week 1): Pt will perform LB dressing with mod A in order to increase I with self care. OT Short Term Goal 3 (Week 1): Pt will engage in 15 minutes of functional tasks with 2 rest breaks or less during this time.  OT Short Term Goal 4 (Week 1): Pt will perform UB dressing with mod A in order to increase I with self care tasks.     Skilled Therapeutic Interventions/Progress Updates: Skilled OT session completed with focus on ADL retraining, endurance, and functional mobility. Pt was lying in bed at time of arrival, requesting to go outdoors. Pt completed LB dressing at EOB with extra time due to left UE weakness, pain, and fatigue. OT assisted with footwear. Pt ambulated off of unit to [facility] entrance with 1 rest break, RW and supervision. Pt reports that walking helps to decrease pain. Jacket donned with Mod A seated on lobby couch, trialed adaptive overhead technique with pt still requiring OT assist. Once outdoors, he engaged in dynamic standing task of basketball dribbling with Min A and frequent rest breaks. For endurance, had pt participate in ball tosses and dribbling while seated on bench. Functional ambulation in community setting completed with RW and min guard, 2 rest breaks due to fatigue. All sit<stands completed with supervision. Afterwards pt ambulated back to unit (CGA for keeping pants up only) and returned to bed. He was left with all needs within reach at time of departure.      Therapy Documentation Precautions:  Precautions Precautions: Cervical, Fall Required Braces or Orthoses: Cervical  Brace Cervical Brace: Hard collar Restrictions Weight Bearing Restrictions: No General:   Vital Signs: Therapy Vitals Temp: 97.5 F (36.4 C) Temp Source: Oral Pulse Rate: 63 Resp: 18 BP: (!) 146/78 Patient Position (if appropriate): Lying Oxygen Therapy SpO2: 100 % O2 Device: Not Delivered Pain: Pt reported pain to be manageable with provided rest breaks   ADL:      See Function Navigator for Current Functional Status.   Therapy/Group: Individual Therapy  Ardyth Kelso A Kingsten Enfield 02/04/2017, 3:35 PM

## 2017-02-04 NOTE — Progress Notes (Signed)
Physical Therapy Session Note  Patient Details  Name: Russell Carter MRN: [id number] Date of Birth: Oct 06, 1956  Today's Date: 02/04/2017 PT Individual Time: 0900-1000 PT Individual Time Calculation (min): 60 min   Short Term Goals: Week 1:  PT Short Term Goal 1 (Week 1): Pt will ambulate 150' RW and min guard PT Short Term Goal 2 (Week 1): Pt will ascend/descent 1 flight stairs 6" height with 1 handrail min guard PT Short Term Goal 3 (Week 1): Pt will demonstrate dynamic standing balance min guard x10 min  Skilled Therapeutic Interventions/Progress Updates: Pt received in restroom completing toileting. C/o pain 4/10 in neck and between shoulders and agreeable to treatment. Discussed treatment plan, d/c date and goals with pt's fiance. Gait to/from gym with RW and S; pt requests to use RW d/t UE pain and for energy conservation. Sitting reaching task with BUE to place cards on board; hand over hand facilitation technique for normalized shoulder mechanics and reduced compensatory shoulder hiking. BUE weight bearing in modified plantigrade with alternating UE reaching. Scapular mobilizations superiorly and upward rotation for pain relief and carryover into reaching and functional UE tasks. Nustep UEs only x6 min for strengthening, coordination and shoulder ROM. Returned to room with gait as above; remained supine in bed at end of session, all needs in reach.      Therapy Documentation Precautions:  Precautions Precautions: Cervical, Fall Required Braces or Orthoses: Cervical Brace Cervical Brace: Hard collar Restrictions Weight Bearing Restrictions: No   See Function Navigator for Current Functional Status.   Therapy/Group: Individual Therapy  Vista Lawman 02/04/2017, 9:50 AM

## 2017-02-05 ENCOUNTER — Inpatient Hospital Stay (HOSPITAL_COMMUNITY): Payer: Medicaid Other

## 2017-02-05 ENCOUNTER — Inpatient Hospital Stay (HOSPITAL_COMMUNITY): Payer: Medicaid Other | Admitting: Occupational Therapy

## 2017-02-05 ENCOUNTER — Inpatient Hospital Stay (HOSPITAL_COMMUNITY): Payer: Medicaid Other | Admitting: Physical Therapy

## 2017-02-05 NOTE — Progress Notes (Signed)
Physical Therapy Session Note  Patient Details  Name: Russell Carter MRN: [id number] Date of Birth: [birth year]  Today's Date: 02/05/2017 PT Individual Time: 1300-1400 PT Individual Time Calculation (min): 60 min   Short Term Goals: Week 1:  PT Short Term Goal 1 (Week 1): Pt will ambulate 150' RW and min guard PT Short Term Goal 1 - Progress (Week 1): Met PT Short Term Goal 2 (Week 1): Pt will ascend/descend 1 flight stairs 6" height with 1 handrail min guard PT Short Term Goal 2 - Progress (Week 1): Met PT Short Term Goal 3 (Week 1): Pt will demonstrate dynamic standing balance min guard x10 min Week 2:  PT Short Term Goal 1 (Week 2): =LTG due to estimated LOS  Skilled Therapeutic Interventions/Progress Updates:   Pt explained situation that occurred earlier in therapy regarding 10/10 pain and dizziness and worried that it may occur again. Pt willing to attempt therapies though stating he "has to work to get better." Using [facility], pt able to gait to/from therapy gym with overall close supervision with no LOB occurring and mild sway noted. Focused on neuro re-ed to address BUE strengthening, motor activation, and active assisted ROM (focus on LUE > RUE but both addressed). Performed modified propped on elbow <> seated position with facilitation for UE positioning and increasing weightbearing and motor activation (varied levels of difficulty) x 5-8 reps each time. Used UE ranger for all directions to activate flexion/extension and abduction/adduction with assist for controlled movement on LUE more so than RUE x 10 reps each direction. Manipulation of stacking cups on table top with assisted movement of LUE and active assisted use of BUE for bimanual task while holding a ball with pt maintaining eyes closed to work on visualization of movement pattern with cues for relaxation of trunk and shoulders to decrease compensatory strategies. End of session returned back to bed with all needs in reach. No episodes  of dizziness noted but occasional reports of feeling "off" at times throughout session that resolved with rest breaks.   Therapy Documentation Precautions:  Precautions Precautions: Cervical, Fall Required Braces or Orthoses: Cervical Brace Cervical Brace: Hard collar Restrictions Weight Bearing Restrictions: No   Pain: Reports pain is better than this morning. Willing to participate and modified activities as needed.    See Function Navigator for Current Functional Status.   Therapy/Group: Individual Therapy  Canary Brim Ivory Broad, PT, DPT  02/05/2017, 2:09 PM

## 2017-02-05 NOTE — Progress Notes (Signed)
Physical Therapy Weekly Progress Note  Patient Details  Name: Russell Carter MRN: [id number] Date of Birth: 1956/01/13  Beginning of progress report period: January 29, 2017 End of progress report period: February 05, 2017  Today's Date: 02/05/2017 PT Individual Time: 0900-1000 PT Individual Time Calculation (min): 60 min   Patient has met 3 of 3 short term goals.  Pt currently performing bed mobility modI and requires S to min guard for transfers, gait and stairs with RW and without AD. Pt continues to be significantly limited in independence with ADLs due to UE neuromuscular deficits.   Patient continues to demonstrate the following deficits muscle weakness and muscle paralysis, unbalanced muscle activation and decreased coordination and decreased standing balance, decreased postural control and decreased balance strategies and therefore will continue to benefit from skilled PT intervention to increase functional independence with mobility.  Patient progressing toward long term goals..  Continue plan of care.  PT Short Term Goals Week 1:  PT Short Term Goal 1 (Week 1): Pt will ambulate 150' RW and min guard PT Short Term Goal 1 - Progress (Week 1): Met PT Short Term Goal 2 (Week 1): Pt will ascend/descend 1 flight stairs 6" height with 1 handrail min guard PT Short Term Goal 2 - Progress (Week 1): Met PT Short Term Goal 3 (Week 1): Pt will demonstrate dynamic standing balance min guard x10 min PT Short Term Goal 3 - Progress (Week 1): Met Week 2:  PT Short Term Goal 1 (Week 2): =LTG due to estimated LOS  Skilled Therapeutic Interventions/Progress Updates: Pt received supine in bed, c/o pain as below and agreeable to treatment. Supine>sit modI. Pt dons socks and shoes with setupA and significantly increased time with multiple attempts d/t UE deficits. Gait to gym no AD and close S. Supine lying on tennis balls for pain relief in rhomboids/mid traps. Gait training with quad cane and SPC; pt has  difficulty with management of quad cane d/t increased weight and unable to get base flat on floor. Improved gait, coordination and safety with SPC. Dynamic gait in hallway including side stepping, backward walking, [place] with and without UE support. 4 square step test x5 trials; second trial 22 sec, fifth trial 12.38 sec. Returned to room with [facility] and close S. Remained supine in bed at end of session, all needs in reach.      Therapy Documentation Precautions:  Precautions Precautions: Cervical, Fall Required Braces or Orthoses: Cervical Brace Cervical Brace: Hard collar Restrictions Weight Bearing Restrictions: No Pain: Pain Assessment Pain Assessment: 0-10 Pain Score: 7  Pain Location: Neck Pain Orientation: Right;Left Pain Descriptors / Indicators: Aching Patients Stated Pain Goal: 3 Pain Intervention(s): Medication (See eMAR)  See Function Navigator for Current Functional Status.  Therapy/Group: Individual Therapy  Luberta Mutter 02/05/2017, 9:53 AM

## 2017-02-05 NOTE — Progress Notes (Signed)
Occupational Therapy Session Note  Patient Details  Name: Russell Carter MRN: [id number] Date of Birth: Oct 01, 1956  Today's Date: 02/05/2017 OT Individual Time: 4128-2081 OT Individual Time Calculation (min): 36 min   Missed Time: 10 min Missed time reason: pain   Short Term Goals: Week 2:     Skilled Therapeutic Interventions/Progress Updates:    1:1 focus on functional mobility with cane and therapeutic exercise to increase strength/ROM in BUEs. Pt reporting pain in neck and back to RN in hallway, but declines anything till after therapy. Pt ambulates throughout unit with single point cane with VC for technique to obtain a towel and tennis balls to lie on top of during rest to mobilize back muscles. In ADL kitchen, pt completes 3G87 towel glides on table in standing of shoulder flexion/extension, horizontal ab/adduction and circles. Immediately following set of circles pt reports sharp 10/10 pain and dizziness. Pt transfers into w/c d/t dizziness unrelieved in sitting. OT propels w/c back to room and alerts RN of pain/dizziness. OT measures vitals BP 127/82 O2 100% and pulse 72 bpm. Pt reports dizziness subsiding, but pain persisting. Pt stand pivot transfer back to bed with MIN A for balance. RN administering pain medication and pt reporting seeing white lights in vision. RN notifies PA of symptoms. Pt lies back in bed with MOD A for LE management and after a few minutes reporting "white lights" and pain decreasing. Exited session with pt supine in bed with call light in reach and RN in room. Pt missed 10 min of skilled OT services d/t severe pain.   Therapy Documentation Precautions:  Precautions Precautions: Cervical, Fall Required Braces or Orthoses: Cervical Brace Cervical Brace: Hard collar Restrictions Weight Bearing Restrictions: No  See Function Navigator for Current Functional Status.   Therapy/Group: Individual Therapy  Shon Hale 02/05/2017, 11:59 AM

## 2017-02-05 NOTE — Progress Notes (Signed)
PHYSICAL MEDICINE & REHABILITATION     PROGRESS NOTE    Subjective/Complaints: Slept much better. Actually did fairly well with therapy yesterday after a bad start  ROS: pt denies nausea, vomiting, diarrhea, cough, shortness of breath or chest pain     Objective: Vital Signs: Blood pressure 140/85, pulse 70, temperature 98.5 F (36.9 C), temperature source Oral, resp. rate 18, height 5\' 11"  (1.803 m), weight 83.7 kg (184 lb 8.4 oz), SpO2 100 %. No results found. No results for input(s): WBC, HGB, HCT, PLT in the last 72 hours.  Recent Labs  02/04/17 0602  NA 138  K 4.7  CL 102  GLUCOSE 106*  BUN 19  CREATININE 1.13  CALCIUM 8.4*   CBG (last 3)  No results for input(s): GLUCAP in the last 72 hours.  Wt Readings from Last 3 Encounters:  01/30/17 83.7 kg (184 lb 8.4 oz)  01/24/17 89.8 kg (197 lb 15.6 oz)  01/21/17 90 kg (198 lb 8 oz)    Physical Exam:  Constitutional: appears uncomfortable  HENT:  Head: Normocephalic and atraumatic.  Eyes: EOMI.  Neck:  Cervical collar in place  Cardiovascular: RRR  Respiratory: CTA B GI: Soft. Bowel sounds are normal. He exhibits no distension.  Musculoskeletal: arm and neck tenderness with ROM  Neurological: He is alert.  Sensation diminished to light touch in shoulders, hands, feet-stable Motor: B/l LE: 4-4+/5 proximal to distal B/L UE: Shoulder abduction 3- to 3/5, 2+5 on left, Right elbow flexion/extension 4/5, hand 4/5 with. Left biceps 3+/5. Left triceps 4/5. HI 4-5/5. --motor exam a half to full grade decreased due to pain in neck/shoulders.  Skin:  Wound remains c/d/i Psychiatric: appropriate  Assessment/Plan: 1. Debility and functional deficits secondary to cervical spondylosis/radiculopathy (s/p C4-7 ACDF, C3-6 PCF)  which require 3+ hours per day of interdisciplinary therapy in a comprehensive inpatient rehab setting. Physiatrist is providing close team supervision and 24 hour management of active  medical problems listed below. Physiatrist and rehab team continue to assess barriers to discharge/monitor patient progress toward functional and medical goals.  Function:  Bathing Bathing position   Position: Shower  Bathing parts Body parts bathed by patient: Chest, Abdomen, Front perineal area, Right upper leg, Left upper leg Body parts bathed by helper: Right arm, Left arm, Buttocks, Right lower leg, Left lower leg, Back  Bathing assist Assist Level:  (MOD A)      Upper Body Dressing/Undressing Upper body dressing   What is the patient wearing?: Pull over shirt/dress     Pull over shirt/dress - Perfomed by patient: Thread/unthread right sleeve, Thread/unthread left sleeve Pull over shirt/dress - Perfomed by helper: Put head through opening, Pull shirt over trunk        Upper body assist Assist Level:  (MOD A)      Lower Body Dressing/Undressing Lower body dressing   What is the patient wearing?: Pants, Socks, Shoes     Pants- Performed by patient: Thread/unthread right pants leg, Pull pants up/down, Thread/unthread left pants leg Pants- Performed by helper: Thread/unthread left pants leg Non-skid slipper socks- Performed by patient: Don/doff right sock, Don/doff left sock (foot on stool) Non-skid slipper socks- Performed by helper: Don/doff right sock, Don/doff left sock Socks - Performed by patient: Don/doff right sock, Don/doff left sock   Shoes - Performed by patient: Don/doff left shoe Shoes - Performed by helper: Fasten right, Fasten left, Don/doff right shoe          Lower body assist Assist  for lower body dressing:  (MOD A)      Toileting Toileting   Toileting steps completed by patient: Adjust clothing prior to toileting, Performs perineal hygiene, Adjust clothing after toileting Toileting steps completed by helper: Performs perineal hygiene, Adjust clothing after toileting Toileting Assistive Devices: Grab bar or rail  Toileting assist Assist level:  Touching or steadying assistance (Pt.75%)   Transfers Chair/bed transfer   Chair/bed transfer method: Ambulatory Chair/bed transfer assist level: Supervision or verbal cues Chair/bed transfer assistive device: Patent attorney     Max distance: 150 Assist level: Supervision or verbal cues   Wheelchair   Type: Manual Max wheelchair distance: 20 Assist Level: Moderate assistance (Pt 50 - 74%)  Cognition Comprehension Comprehension assist level: Follows basic conversation/direction with no assist  Expression Expression assist level: Expresses basic needs/ideas: With no assist  Social Interaction Social Interaction assist level: Interacts appropriately 90% of the time - Needs monitoring or encouragement for participation or interaction.  Problem Solving Problem solving assist level: Solves basic problems with no assist  Memory Memory assist level: Recognizes or recalls 90% of the time/requires cueing < 10% of the time   Medical Problem List and Plan: 1.  Debilitation with neck and shoulder pain secondary to cervical spondylosis with radiculopathy. Status post C4-7 anterior discectomy and fusion, posterior cervical fusion C3-C6. Cervical collar all times  -continue PT, OT-  2.  DVT Prophylaxis/Anticoagulation: SCDs.  Dopplers negative 3. Pain Management: OxyContin sustained release 30 mg every 12 hours,   Oxycodone, Robaxin as needed  -gabapentin increased to 800mg  TID  -pain is fluctuating. Pt understands the potential for fluctuation 4. Mood: Provide emotional support 5. Neuropsych: This patient is capable of making decisions on his own behalf. 6. Skin/Wound Care: Routine skin checks 7. Fluids/Electrolytes/Nutrition:    -BUN much improved--19 on 4/`16  -continue to encourage fluids 8. Acute blood loss anemia. Follow-up CBC with hgb 9.1   9. CAD with history of MI. Patient on aspirin and Brilinta prior to admission. 10. Diet-controlled diabetes mellitus..  Diabetic teaching. Hemoglobin A1c 6.2 11. CKD stage III. Mild ARI,  Creatinine baseline 1.43-1.54  -encourage PO fluids  -Cr 1.13--  12. History of CVA without residual. Discuss resuming aspirin. 13. Leukocytosis:    -afebrile without any clinical signs of infection  -likely steroid related-- steroid wean to 2mg  q12  -re-check thursday    LOS (Days) 8 A FACE TO FACE EVALUATION WAS PERFORMED  Ranelle Oyster, MD 02/05/2017 8:42 AM

## 2017-02-05 NOTE — Plan of Care (Signed)
Problem: RH Eating Goal: LTG Patient will perform eating w/assist, cues/equip (OT) LTG: Patient will perform eating with assist, with/without cues using equipment (OT)  Downgraded JLS  Problem: RH Grooming Goal: LTG Patient will perform grooming w/assist,cues/equip (OT) LTG: Patient will perform grooming with assist, with/without cues using equipment (OT)  doiwngraded JLS  Problem: RH Bathing Goal: LTG Patient will bathe with assist, cues/equipment (OT) LTG: Patient will bathe specified number of body parts with assist with/without cues using equipment (position)  (OT)  Downgraded JLS  Problem: RH Dressing Goal: LTG Patient will perform upper body dressing (OT) LTG Patient will perform upper body dressing with assist, with/without cues (OT).  Downgraded JLS   Problem: RH Toileting Goal: LTG Patient will perform toileting w/assist, cues/equip (OT) LTG: Patient will perform toiletiing (clothes management/hygiene) with assist, with/without cues using equipment (OT)  Downgraded JLS  Problem: RH Simple Meal Prep Goal: LTG Patient will perform simple meal prep w/assist (OT) LTG: Patient will perform simple meal prep with assistance, with/without cues (OT).  Outcome: Not Applicable Date Met: 57/49/35 Goal d/c at this time

## 2017-02-05 NOTE — Progress Notes (Signed)
Occupational Therapy Session Note  Patient Details  Name: Russell Carter MRN: [id number] Date of Birth: [birth year]  Today's Date: 02/05/2017 OT Individual Time: 0830-0900 OT Individual Time Calculation (min): 30 min    Short Term Goals: Week 1:  OT Short Term Goal 1 (Week 1): Pt will perform LB clothing management during toileting with steady assistance. OT Short Term Goal 2 (Week 1): Pt will perform LB dressing with mod A in order to increase I with self care. OT Short Term Goal 3 (Week 1): Pt will engage in 15 minutes of functional tasks with 2 rest breaks or less during this time.  OT Short Term Goal 4 (Week 1): Pt will perform UB dressing with mod A in order to increase I with self care tasks.  Skilled Therapeutic Interventions/Progress Updates:    1:1 self care retraining at shower level. Pt ambulating around room with RW with supervision. Pt continues to require significant A for bathing and dressing due to pain in shoulders and decr functional movement in UEs. Pt can push down pants to undress and for toileting but unable to pull them up. Pt also requires max A don/ doff a shirt. Pt unable to don socks or shoes without max A, especially if they have a fastener. Pt can bathe front periarea, thighs and lower stomach but requires  A for the rest of it. Verbal Education provided on changing c-collar brace pads in supine and cleaning the pads.  Left resting in bed when completed ADL.  Therapy Documentation Precautions:  Precautions Precautions: Cervical, Fall Required Braces or Orthoses: Cervical Brace Cervical Brace: Hard collar Restrictions Weight Bearing Restrictions: No Pain: Pain Assessment Pain Assessment: 0-10 Pain Score: 4  Pain Location: Neck Pain Orientation: Right;Left Pain Radiating Towards:  (shoulders) Pain Descriptors / Indicators: Aching Pain Onset: On-going Patients Stated Pain Goal: 3 Pain Intervention(s): Medication (See eMAR)  See Function Navigator for  Current Functional Status.   Therapy/Group: Individual Therapy  Roney Mans [facility] 02/05/2017, 3:35 PM

## 2017-02-05 NOTE — Progress Notes (Signed)
Called to room by therapy pt reporting dizziness and seeing white spots. VSS, reporting > 10 pain tween shoulders/neck. Pain medication administered, repositioned in bed. Pt reported pain and sensation dubsiding. Dan Finland PAC notified of symptoms. No new orders received.  Pamelia Hoit

## 2017-02-06 ENCOUNTER — Inpatient Hospital Stay (HOSPITAL_COMMUNITY): Payer: Medicaid Other | Admitting: Physical Therapy

## 2017-02-06 ENCOUNTER — Inpatient Hospital Stay (HOSPITAL_COMMUNITY): Payer: Medicaid Other | Admitting: Occupational Therapy

## 2017-02-06 MED ORDER — POLYVINYL ALCOHOL 1.4 % OP SOLN
1.0000 [drp] | OPHTHALMIC | Status: DC | PRN
  Filled 2017-02-06: qty 15

## 2017-02-06 NOTE — Progress Notes (Signed)
[place] PHYSICAL MEDICINE & REHABILITATION     PROGRESS NOTE    Subjective/Complaints: Feeling well this morning. Had an episode where he "passed out yesterday". Was having a lot of pain which increased to the point where he saw white "spots" and then felt disoriented.   ROS: pt denies nausea, vomiting, diarrhea, cough, shortness of breath or chest pain      Objective: Vital Signs: Blood pressure 126/72, pulse 69, temperature 98.7 F (37.1 C), temperature source Oral, resp. rate 18, height 5\' 11"  (1.803 m), weight 87.1 kg (192 lb 0.3 oz), SpO2 99 %. No results found. No results for input(s): WBC, HGB, HCT, PLT in the last 72 hours.  Recent Labs  02/04/17 0602  NA 138  K 4.7  CL 102  GLUCOSE 106*  BUN 19  CREATININE 1.13  CALCIUM 8.4*   CBG (last 3)  No results for input(s): GLUCAP in the last 72 hours.  Wt Readings from Last 3 Encounters:  02/06/17 87.1 kg (192 lb 0.3 oz)  01/24/17 89.8 kg (197 lb 15.6 oz)  01/21/17 90 kg (198 lb 8 oz)    Physical Exam:  Constitutional: appears uncomfortable  HENT:  Head: Normocephalic and atraumatic.  Eyes: EOMI.  Neck:  Cervical collar in place  Cardiovascular: RRR  Respiratory: CTA B GI: Soft. Bowel sounds are normal. He exhibits no distension.  Musculoskeletal: arm and neck tenderness with ROM  Neurological: He is alert.  Sensation diminished to light touch in shoulders, hands, feet-stable Motor: B/l LE: 4-4+/5 proximal to distal B/L UE: Shoulder abduction 3- to 3/5, 2+5 on left, Right elbow flexion/extension 4/5, hand 4/5 with. Left biceps 3+/5. Left triceps 3+/5. HI 3-5/5. --no changes.  Skin:  Wound remains c/d/i Psychiatric: appropriate  Assessment/Plan: 1. Debility and functional deficits secondary to cervical spondylosis/radiculopathy (s/p C4-7 ACDF, C3-6 PCF)  which require 3+ hours per day of interdisciplinary therapy in a comprehensive inpatient rehab setting. Physiatrist is providing close team  supervision and 24 hour management of active medical problems listed below. Physiatrist and rehab team continue to assess barriers to discharge/monitor patient progress toward functional and medical goals.  Function:  Bathing Bathing position   Position: Shower  Bathing parts Body parts bathed by patient: Chest, Abdomen, Front perineal area, Right upper leg, Left upper leg Body parts bathed by helper: Right arm, Left arm, Buttocks, Right lower leg, Left lower leg, Back  Bathing assist Assist Level:  (MOD A)      Upper Body Dressing/Undressing Upper body dressing   What is the patient wearing?: Pull over shirt/dress     Pull over shirt/dress - Perfomed by patient: Thread/unthread right sleeve, Thread/unthread left sleeve Pull over shirt/dress - Perfomed by helper: Put head through opening, Pull shirt over trunk        Upper body assist Assist Level:  (MOD A)      Lower Body Dressing/Undressing Lower body dressing   What is the patient wearing?: Pants, Socks, Shoes     Pants- Performed by patient: Thread/unthread right pants leg, Pull pants up/down, Thread/unthread left pants leg Pants- Performed by helper: Thread/unthread left pants leg Non-skid slipper socks- Performed by patient: Don/doff right sock, Don/doff left sock (foot on stool) Non-skid slipper socks- Performed by helper: Don/doff right sock, Don/doff left sock Socks - Performed by patient: Don/doff right sock, Don/doff left sock   Shoes - Performed by patient: Don/doff left shoe Shoes - Performed by helper: Fasten right, Fasten left, Don/doff right shoe  Lower body assist Assist for lower body dressing:  (MOD A)      Toileting Toileting   Toileting steps completed by patient: Adjust clothing prior to toileting, Performs perineal hygiene, Adjust clothing after toileting Toileting steps completed by helper: Performs perineal hygiene, Adjust clothing after toileting Toileting Assistive Devices: Grab bar  or rail  Toileting assist Assist level: Touching or steadying assistance (Pt.75%)   Transfers Chair/bed transfer   Chair/bed transfer method: Ambulatory Chair/bed transfer assist level: Supervision or verbal cues Chair/bed transfer assistive device: Cane, Designer, fashion/clothing     Max distance: 150' Assist level: Supervision or verbal cues   Wheelchair   Type: Manual Max wheelchair distance: 20 Assist Level: Moderate assistance (Pt 50 - 74%)  Cognition Comprehension Comprehension assist level: Follows basic conversation/direction with no assist  Expression Expression assist level: Expresses basic needs/ideas: With no assist  Social Interaction Social Interaction assist level: Interacts appropriately 90% of the time - Needs monitoring or encouragement for participation or interaction.  Problem Solving Problem solving assist level: Solves basic problems with no assist  Memory Memory assist level: Recognizes or recalls 90% of the time/requires cueing < 10% of the time   Medical Problem List and Plan: 1.  Debilitation with neck and shoulder pain secondary to cervical spondylosis with radiculopathy. Status post C4-7 anterior discectomy and fusion, posterior cervical fusion C3-C6. Cervical collar all times  -continue PT, OT-   -needs to identify plan/assistance for some ADL's once home 2.  DVT Prophylaxis/Anticoagulation: SCDs.  Dopplers negative 3. Pain Management: OxyContin sustained release 30 mg every 12 hours,   Oxycodone, Robaxin as needed  -gabapentin increased to 800mg  TID  4. Mood: Provide emotional support  -anxiety is an issue at times, linked to pain 5. Neuropsych: This patient is capable of making decisions on his own behalf. 6. Skin/Wound Care: Routine skin checks 7. Fluids/Electrolytes/Nutrition:    -BUN much improved--19 on 4/`16  -continue to encourage fluids 8. Acute blood loss anemia. Follow-up CBC with hgb 9.1   9. CAD with history of MI. Patient  on aspirin and Brilinta prior to admission. 10. Diet-controlled diabetes mellitus.. Diabetic teaching. Hemoglobin A1c 6.2 11. CKD stage III. Mild ARI,  Creatinine baseline 1.43-1.54  -encourage PO fluids  -Cr 1.13--  12. History of CVA without residual. Discuss resuming aspirin. 13. Leukocytosis:    -afebrile without any clinical signs of infection  -likely steroid related-- steroid wean to 2mg  q12  -re-check tomorrow 14. "unresponsive episode"---suspect it was pain/anxiety driven. Follow CV parameters with activity  -labs tomorrow  -reassured pt    LOS (Days) 9 A FACE TO FACE EVALUATION WAS PERFORMED  Ranelle Oyster, MD 02/06/2017 9:10 AM

## 2017-02-06 NOTE — Progress Notes (Signed)
Occupational Therapy Session Note  Patient Details  Name: Russell Carter MRN: [id number] Date of Birth: 23-Jul-1956  Today's Date: 02/06/2017 OT Individual Time: 1300-1345 OT Individual Time Calculation (min): 45 min    Skilled Therapeutic Interventions/Progress Updates:    Applied Kinesotape to mid back and shoulders for pain relief and to promote relaxation   Engaged in [facility] for bilateral UEs in sidelying and supine (positioning to decr pain). Applied NMES to left bicep to encourage flexion and strengthening without recruiting shoulder (elevation increasing his pain.   Pt decr tolerance to pain and requested to rest in bed the rest of pm.   Therapy Documentation Precautions:  Precautions Precautions: Cervical, Fall Required Braces or Orthoses: Cervical Brace Cervical Brace: Hard collar Restrictions Weight Bearing Restrictions: No General: General OT Amount of Missed Time: 15 Minutes Pain:  onoing pain in bilateral scapular regions  See Function Navigator for Current Functional Status.   Therapy/Group: Individual Therapy  Roney Mans [facility] 02/06/2017, 3:59 PM

## 2017-02-06 NOTE — Progress Notes (Signed)
Physical Therapy Session Note  Patient Details  Name: Russell Carter MRN: [id number] Date of Birth: 09-Aug-1956  Today's Date: 02/06/2017 PT Individual Time: 0800-0915 PT Individual Time Calculation (min): 75 min   Short Term Goals: Week 2:  PT Short Term Goal 1 (Week 2): =LTG due to estimated LOS  Skilled Therapeutic Interventions/Progress Updates: Pt presented sitting at EOB agreeable to therapy. Pt stating feeling uncomfortable with SPC. Explained progression of therapy and benefits of continued trial of SPC. Pt agreeable to continue using. Ambulated to rehab gym with SPC 3 point pattern with min guard and no LOB. Performed scap retraction on tray table with towel x10 for LUE NMR. TPR to periscapular/mid traps with improved relief. Toe taps to 4in step with SPC to fatigue. Pt ascend/decend 3in steps x 8 with SPC step to pattern. Mod cues for sequencing. Performed 4in step up with SPC with improved technique and step to pattern. NuStep L7 x10 min for endurance. Pt ambulated back to room in same manner and prior and request to use toilet. Pt left at bathroom verbalizing understanding to use call ball and NT notified.      Therapy Documentation Precautions:  Precautions Precautions: Cervical, Fall Required Braces or Orthoses: Cervical Brace Cervical Brace: Hard collar Restrictions Weight Bearing Restrictions: No General:   Vital Signs:   Pain: Pain Assessment Pain Assessment: 0-10 Pain Score: 0-No pain   See Function Navigator for Current Functional Status.   Therapy/Group: Individual Therapy  Zeke Aker  Everhett Bozard, PTA  02/06/2017, 12:24 PM

## 2017-02-06 NOTE — Progress Notes (Signed)
Physical Therapy Session Note  Patient Details  Name: Russell Carter MRN: [id number] Date of Birth: 12-08-1955  Today's Date: 02/06/2017 PT Individual Time: 1000-1100 PT Individual Time Calculation (min): 60 min   Short Term Goals: Week 2:  PT Short Term Goal 1 (Week 2): =LTG due to estimated LOS  Skilled Therapeutic Interventions/Progress Updates: Pt received supine in bed, denies pain with recent medication, and agreeable to treatment. Gait indoors/outdoors 200-250' at a time before requiring seated rest break d/t fatigue. Use of SPC and S overall. Gait outdoors on unlevel surfaces including mulch, grass, and obstacle course around plants/trees. One Tritch LOB when ascending curb step, requiring min guard and use of UE on nearby trash can. Performed biodex catch game on stable and unstable surface with min guard; UE support gradually decreased from BUE>no UE. Returned to room with gait, no AD and S. Remained supine in bed with alarm intact and all needs in reach at end of session.     Therapy Documentation Precautions:  Precautions Precautions: Cervical, Fall Required Braces or Orthoses: Cervical Brace Cervical Brace: Hard collar Restrictions Weight Bearing Restrictions: No Pain: Pain Assessment Pain Assessment: 0-10 Pain Score: 0-No pain   See Function Navigator for Current Functional Status.   Therapy/Group: Individual Therapy  Vista Lawman 02/06/2017, 12:06 PM

## 2017-02-07 ENCOUNTER — Inpatient Hospital Stay (HOSPITAL_COMMUNITY): Payer: Medicaid Other | Admitting: Physical Therapy

## 2017-02-07 ENCOUNTER — Inpatient Hospital Stay (HOSPITAL_COMMUNITY): Payer: Medicaid Other

## 2017-02-07 LAB — BASIC METABOLIC PANEL
ANION GAP: 10 (ref 5–15)
BUN: 16 mg/dL (ref 6–20)
CALCIUM: 8.9 mg/dL (ref 8.9–10.3)
CO2: 23 mmol/L (ref 22–32)
Chloride: 104 mmol/L (ref 101–111)
Creatinine, Ser: 1.18 mg/dL (ref 0.61–1.24)
Glucose, Bld: 105 mg/dL — ABNORMAL HIGH (ref 65–99)
Potassium: 4.2 mmol/L (ref 3.5–5.1)
SODIUM: 137 mmol/L (ref 135–145)

## 2017-02-07 LAB — CBC
HCT: 29.6 % — ABNORMAL LOW (ref 39.0–52.0)
HEMOGLOBIN: 9.6 g/dL — AB (ref 13.0–17.0)
MCH: 29.6 pg (ref 26.0–34.0)
MCHC: 32.4 g/dL (ref 30.0–36.0)
MCV: 91.4 fL (ref 78.0–100.0)
Platelets: 284 10*3/uL (ref 150–400)
RBC: 3.24 MIL/uL — AB (ref 4.22–5.81)
RDW: 15.4 % (ref 11.5–15.5)
WBC: 9.7 10*3/uL (ref 4.0–10.5)

## 2017-02-07 NOTE — Progress Notes (Signed)
Occupational Therapy Session Note  Patient Details  Name: Russell Carter MRN: [id number] Date of Birth: 1956/03/10  Today's Date: 02/07/2017 OT Individual Time: 1117-3567 OT Individual Time Calculation (min): 88 min    Short Term Goals: Week 1:  OT Short Term Goal 1 (Week 1): Pt will perform LB clothing management during toileting with steady assistance. OT Short Term Goal 2 (Week 1): Pt will perform LB dressing with mod A in order to increase I with self care. OT Short Term Goal 3 (Week 1): Pt will engage in 15 minutes of functional tasks with 2 rest breaks or less during this time.  OT Short Term Goal 4 (Week 1): Pt will perform UB dressing with mod A in order to increase I with self care tasks.  Skilled Therapeutic Interventions/Progress Updates:    Pt sitting EOB upon arrival.  Pt deferred shower and changing clothing until tomorrow.  Pt amb with SPC to gym and initially engaged in BUE PROM sitting EOM and in supine. Pt returned to sitting EOM and NMES applied to L bicep to encourage flexion and strengthening without recruiting traps/rhomboids (shoulder). Pt returned to sidelying and engaging LUE AAROM. Pt returned to room and requested to use toilet before returning to bed.  Pt able to complete all toileting tasks at supervision level.  Pt returned to bed and remained in bed with all needs within reach and bed alarm activated.   Therapy Documentation Precautions:  Precautions Precautions: Cervical, Fall Required Braces or Orthoses: Cervical Brace Cervical Brace: Hard collar Restrictions Weight Bearing Restrictions: No   Pain:  8/10 B shoulders (traps/rhomboids); RN aware, soft tissue mobilizations, repostioned  See Function Navigator for Current Functional Status.   Therapy/Group: Individual Therapy  Rich Brave 02/07/2017, 9:45 AM

## 2017-02-07 NOTE — Progress Notes (Signed)
Occupational Therapy Weekly Progress Note  Patient Details  Name: Russell Carter MRN: [id number] Date of Birth: 12/11/1955  Beginning of progress report period: January 29, 2017 End of progress report period: February 07, 2017  Patient has met 3 of 4 short term goals.  Pt made slow progress with BADLs since admission.  Pt continues to experience considerable pain in traps/rhomboids, increasing when attempting to use BUE during functional tasks.  LTGs downgraded by OTR to min A overall.  Pt continues to recruit traps/rhomboids when using BUE for functional tasks.  [name] to participate in therapy before discharge at min A level.  Pt amb with SPC to access bathroom and performs all transfers with supervision.  Patient continues to demonstrate the following deficits: muscle weakness, decreased cardiorespiratoy endurance, abnormal tone, unbalanced muscle activation, decreased coordination and decreased motor planning and decreased standing balance and decreased balance strategies and therefore will continue to benefit from skilled OT intervention to enhance overall performance with BADL and Reduce care partner burden.  Patient progressing toward long term goals..  Continue plan of care.  OT Short Term Goals Week 1:  OT Short Term Goal 1 (Week 1): Pt will perform LB clothing management during toileting with steady assistance. OT Short Term Goal 1 - Progress (Week 1): Met OT Short Term Goal 2 (Week 1): Pt will perform LB dressing with mod A in order to increase I with self care. OT Short Term Goal 2 - Progress (Week 1): Progressing toward goal OT Short Term Goal 3 (Week 1): Pt will engage in 15 minutes of functional tasks with 2 rest breaks or less during this time.  OT Short Term Goal 3 - Progress (Week 1): Met OT Short Term Goal 4 (Week 1): Pt will perform UB dressing with mod A in order to increase I with self care tasks. OT Short Term Goal 4 - Progress (Week 1): Met Week 2:  OT Short Term Goal 1  (Week 2): STG=LTG seondary to ELOS      Therapy Documentation Precautions:  Precautions Precautions: Cervical, Fall Required Braces or Orthoses: Cervical Brace Cervical Brace: Hard collar Restrictions Weight Bearing Restrictions: No    See Function Navigator for Current Functional Status.    Leotis Shames [facility] 02/07/2017, 3:10 PM

## 2017-02-07 NOTE — Progress Notes (Signed)
Pt got up to the bathroom and when walking back from the bathroom he started to have pain in his chest. The patients vital signs were taken and he was made comfortable in the bed lying down. BP 142/84, P 67, O2SAT 99, Temp 98.0. Skin is dry and warm, no SOB, no cyanosis. The patient was administered 1o mg of OxyContin by mouth. He stated that this has happened to him twice in the past week both times in pphtsical thearpy. He stated that it occurs when he is getting up from a lying or sitting position to quickly. He stated that at home he takes nitrogylcerin for this problem. He does not have any ordered at this time. Patient laid in bed for 5 minutes past the pain medicine administration and he stated that the pain in his chest was subsiding. After 5 more minutes the pain had completely subsided. The patient is resting comfortably at the present time with out any signs or symptoms of cardiac difficulty. In the AM MD will be informed of the incidents that have happened over the past week. Other disciplines will be informed as well.

## 2017-02-07 NOTE — Progress Notes (Signed)
Social Work Patient ID: Russell Carter, male   DOB: 01/15/1956, 61 y.o.   MRN: [id number]   CSW met with pt 02-06-17 to update him on team conference discussion.  He is looking forward to going home and feels he will be ready by the time his d/c day comes (02-12-17).  CSW spoke with pt's brother via pt's cellphone while in the room with pt and he will come for family education on Friday, April 20th from 10am-noon.  CSW will work to order any recommended DME.  Not sure pt will qualify for f/u therapies with Medicaid and CSW will check into this for pt based on diagnoses.  CSW will continue to follow and assist as needed.  

## 2017-02-07 NOTE — Progress Notes (Signed)
Physical Therapy Session Note  Patient Details  Name: Russell Carter MRN: [id number] Date of Birth: October 07, 1956  Today's Date: 02/07/2017 PT Individual Time: 1415-1500 and 1100-1200 PT Individual Time Calculation (min): 45 min and 60 min (total 105 min)   Short Term Goals: Week 2:  PT Short Term Goal 1 (Week 2): =LTG due to estimated LOS  Skilled Therapeutic Interventions/Progress Updates: Tx 1: Pt received seated in w/c, denies pain and agreeable to treatment. Gait to/from gym with [facility] and S. Ascent/descent 12 6-inch stairs with B handrails for BUE NMR, and S overall with step-to pattern. Sidelying LUE elbow flexion/extension with pillow to reduce friction on bedside table; assist required to stabilize scapula and prevent compensation/substitution. Supine BUE shoulder abduction for focus on middle deltoid activation performed 3 sets to fatigue/form failure; therapist again stabilized scapula to reduce compensation and pain. Sidelying LUE shoulder flexion for activation of anterior deltoid; AAROM and assist for scapular stabilization. D1 flexion/extension AAROM BUE for coordination and motor recruitment in multiplanar activity. Gait to return to room as above; remained seated in bed with alarm intact and all needs in reach.   Tx 2: Pt received supine in bed, c/o pain "not so bad" in neck, but increased to 8/10 as below by end of session in mid traps/neck. Gait to gym with [facility] and S. Manual therapy including soft tissue massage and trigger point release to middle trap, levator scapulae with no improvement in pain symptoms. Gentle grade I superior/inferior scapular mobilizations performed with pt reporting increase in pain. Nustep x9 min with BLE with hot back donned to middle trap area. Returned to room totalA in w/c d/t pain. Ambulation in/out of bathroom with SPC and S. Pt performed hygiene and clothing management modI. Remained supine in bed at end of session, all needs in reach.      Therapy  Documentation Precautions:  Precautions Precautions: Cervical, Fall Required Braces or Orthoses: Cervical Brace Cervical Brace: Hard collar Restrictions Weight Bearing Restrictions: No Pain: Pain Assessment Pain Assessment: 0-10 Pain Score: 8  Pain Type: Chronic pain Pain Location: Neck Pain Descriptors / Indicators: Aching Pain Frequency: Intermittent Pain Onset: On-going Pain Intervention(s): Medication (See eMAR)   See Function Navigator for Current Functional Status.   Therapy/Group: Individual Therapy  Vista Lawman 02/07/2017, 2:55 PM

## 2017-02-07 NOTE — Progress Notes (Signed)
[facility] PHYSICAL MEDICINE & REHABILITATION     PROGRESS NOTE    Subjective/Complaints: Doing fairly well. Denies new pain. Feels that when his collar is too loose he has more pain. Feeding himself breakfast at bedside.   ROS: pt denies nausea, vomiting, diarrhea, cough, shortness of breath or chest pain     Objective: Vital Signs: Blood pressure 111/65, pulse 67, temperature 98.5 F (36.9 C), temperature source Oral, resp. rate 18, height 5\' 11"  (1.803 m), weight 87.1 kg (192 lb 0.3 oz), SpO2 96 %. No results found.  Recent Labs  02/07/17 0505  WBC 9.7  HGB 9.6*  HCT 29.6*  PLT 284    Recent Labs  02/07/17 0505  NA 137  K 4.2  CL 104  GLUCOSE 105*  BUN 16  CREATININE 1.18  CALCIUM 8.9   CBG (last 3)  No results for input(s): GLUCAP in the last 72 hours.  Wt Readings from Last 3 Encounters:  02/06/17 87.1 kg (192 lb 0.3 oz)  01/24/17 89.8 kg (197 lb 15.6 oz)  01/21/17 90 kg (198 lb 8 oz)    Physical Exam:  Constitutional: He appears well-developed and well-nourished.  HENT:  Head: Normocephalic and atraumatic.  Eyes: EOMI.  Neck:  Cervical collar in place  Cardiovascular: RRR Respiratory: CTA B GI: Soft. Bowel sounds are normal. He exhibits no distension.  Musculoskeletal: He exhibits edema. He exhibits no tenderness.  Neurological: He is alert.  Sensation diminished to light touch in shoulders, hands, feet---inconsistent voice intelligible.  Motor: B/l LE: 4-4+/5 proximal to distal B/L UE: Shoulder abduction 3- to 3/5, 2+5 on left.  Right elbow flexion/extension 4/5, hand 4/5 with. Left biceps 3+/5. Left triceps 4/5. HI 4-5/5. --no changes in motor exam Skin:  Wound c/d/i Psychiatric: anxious  Assessment/Plan: 1. Debility and functional deficits secondary to cervical spondylosis/radiculopathy (s/p C4-7 ACDF, C3-6 PCF)  which require 3+ hours per day of interdisciplinary therapy in a comprehensive inpatient rehab setting. Physiatrist is  providing close team supervision and 24 hour management of active medical problems listed below. Physiatrist and rehab team continue to assess barriers to discharge/monitor patient progress toward functional and medical goals.  Function:  Bathing Bathing position   Position: Shower  Bathing parts Body parts bathed by patient: Chest, Abdomen, Front perineal area, Right upper leg, Left upper leg Body parts bathed by helper: Right arm, Left arm, Buttocks, Right lower leg, Left lower leg, Back  Bathing assist Assist Level:  (MOD A)      Upper Body Dressing/Undressing Upper body dressing   What is the patient wearing?: Pull over shirt/dress     Pull over shirt/dress - Perfomed by patient: Thread/unthread right sleeve, Thread/unthread left sleeve Pull over shirt/dress - Perfomed by helper: Put head through opening, Pull shirt over trunk        Upper body assist Assist Level:  (MOD A)      Lower Body Dressing/Undressing Lower body dressing   What is the patient wearing?: Pants, Socks, Shoes     Pants- Performed by patient: Thread/unthread right pants leg, Pull pants up/down, Thread/unthread left pants leg Pants- Performed by helper: Thread/unthread left pants leg Non-skid slipper socks- Performed by patient: Don/doff right sock, Don/doff left sock (foot on stool) Non-skid slipper socks- Performed by helper: Don/doff right sock, Don/doff left sock Socks - Performed by patient: Don/doff right sock, Don/doff left sock   Shoes - Performed by patient: Don/doff left shoe Shoes - Performed by helper: Fasten right, Fasten left, Don/doff right  shoe          Lower body assist Assist for lower body dressing:  (MOD A)      Toileting Toileting   Toileting steps completed by patient: Adjust clothing prior to toileting, Performs perineal hygiene, Adjust clothing after toileting Toileting steps completed by helper: Performs perineal hygiene, Adjust clothing after toileting Toileting  Assistive Devices: Grab bar or rail  Toileting assist Assist level: Touching or steadying assistance (Pt.75%)   Transfers Chair/bed transfer   Chair/bed transfer method: Ambulatory Chair/bed transfer assist level: Supervision or verbal cues Chair/bed transfer assistive device: Cane, Designer, fashion/clothing     Max distance: 250 Assist level: Supervision or verbal cues   Wheelchair   Type: Manual Max wheelchair distance: 20 Assist Level: Moderate assistance (Pt 50 - 74%)  Cognition Comprehension Comprehension assist level: Follows basic conversation/direction with no assist  Expression Expression assist level: Expresses basic needs/ideas: With no assist  Social Interaction Social Interaction assist level: Interacts appropriately 90% of the time - Needs monitoring or encouragement for participation or interaction.  Problem Solving Problem solving assist level: Solves basic problems with no assist  Memory Memory assist level: Recognizes or recalls 90% of the time/requires cueing < 10% of the time   Medical Problem List and Plan: 1.  Debilitation with neck and shoulder pain secondary to cervical spondylosis with radiculopathy. Status post C4-7 anterior discectomy and fusion, posterior cervical fusion C3-C6. Cervical collar all times  -continue PT, OT-  2.  DVT Prophylaxis/Anticoagulation: SCDs.  Dopplers negative 3. Pain Management: OxyContin sustained release 30 mg every 12 hours, Neurontin 600 mg 3 times a day, Oxycodone, Robaxin as needed  -pain seems to be improving.  4. Mood: Provide emotional support 5. Neuropsych: This patient is capable of making decisions on his own behalf. 6. Skin/Wound Care: Removed cervical dressing today 7. Fluids/Electrolytes/Nutrition:    -BUN stable  -continue to push fluids and recheck labs monday 8. Acute blood loss anemia. Follow-up CBC with hgb 9.1   9. CAD with history of MI. Patient on aspirin and Brilinta prior to admission. 10.  Diet-controlled diabetes mellitus.. Diabetic teaching. Hemoglobin A1c 6.2 11. CKD stage III. Mild ARI,  Creatinine baseline 1.43-1.54  -encourage PO fluids  -Cr 1.23---recheck monday 12. History of CVA without residual. Discuss resuming aspirin. 13. Leukocytosis: WBC's trending up  -afebrile without any clinical signs of infection  -likely steroid related--continue steroid wean  -continue to monitor      LOS (Days) 10 A FACE TO FACE EVALUATION WAS PERFORMED  Ranelle Oyster, MD 02/07/2017 8:54 AM

## 2017-02-07 NOTE — Patient Care Conference (Signed)
Inpatient RehabilitationTeam Conference and Plan of Care Update Date: 02/05/2017   Time: 2:05 PM    Patient Name: Russell Carter      Medical Record Number: [id number]  Date of Birth: November 06, 1955 Sex: Male         Room/Bed: 4W10C/4W10C-01 Payor Info: Payor: MEDICAID [place] / Plan: MEDICAID [place] ACCESS / Product Type: *No Product type* /    Admitting Diagnosis: cervical fusion  Admit Date/Time:  01/28/2017  3:40 PM Admission Comments: No comment available   Primary Diagnosis:  Cervical spondylosis with radiculopathy Principal Problem: Cervical spondylosis with radiculopathy  Patient Active Problem List   Diagnosis Date Noted  . Debility   . Post-operative pain   . Radicular pain   . Diabetes mellitus type 2 in nonobese (HCC)   . Surgery, elective   . Postoperative pain   . Leukocytosis   . Thrombocytopenia (HCC)   . Acute blood loss anemia   . History of myocardial infarction   . Cocaine abuse   . Depression   . Type 2 diabetes mellitus with hyperglycemia, without long-term current use of insulin (HCC)   . Stage 3 chronic kidney disease   . Benign essential HTN   . History of CVA (cerebrovascular accident)   . Prediabetes   . Cervical spondylosis with radiculopathy 01/24/2017  . Dyslipidemia 03/15/2016  . Unstable angina (HCC) - chest pain with complete heart block & chest pain 03/15/2016  . Complete heart block (HCC) 03/14/2016  . Cervical spinal stenosis 03/14/2016  . Chest pain with moderate risk of acute coronary syndrome 03/13/2016  . CAD-50% LAD 2011 12/14/2014  . Gout 11/16/2014  . Headache 07/15/2013  . Polysubstance abuse 01/15/2013  . DIZZINESS 06/05/2010  . Essential hypertension 05/18/2009  . Acute myocardial infarction (HCC) 05/18/2009  . MUSCLE SPASM 05/18/2009    Expected Discharge Date: Expected Discharge Date: 02/12/17  Team Members Present: Physician leading conference: Arley Phenix, PsyD;Dr. Faith Rogue Social Worker Present: Staci Acosta,  LCSW Nurse Present: Carmie End, RN PT Present: Karolee Stamps, Marcene Brawn, PT OT Present: Roney Mans, OT SLP Present: Reuel Derby, SLP PPS Coordinator present : Tora Duck, RN, CRRN     Current Status/Progress Goal Weekly Team Focus  Medical   pain fluctuates but generally improved. patient working through pain. sugars under better control  improve functional use of UE's  pain control, diabetes mgt   Bowel/Bladder   Continent of bowel/bladder LBM 4/16/ 18  to remain continent of BB with min assist  Assist with toileting need and timed toileting q 2hrs and as needed   Swallow/Nutrition/ Hydration             ADL's   steadying A to supervision for transfers, mod -max A for bathing and dressing due to difficulty with use of bilateral UEs and pain  mod I for transfers, bathing and dressing goals min A , toileting mod A (downgraded)  functional mobility, functional use of bilateral UEs, family education with brother, activity tolerance   Mobility   modI bed mobility, S transfers, gait, min guard stairs  modI bed mobility/transfers, S gait in home, community and stairs  UE NMR, dynamic standing balance, activity tolerance   Communication             Safety/Cognition/ Behavioral Observations            Pain   Normally complain of neck pain, taking oxy IR 10 mg PRN and seems to be effective  <2  assess and treat pain  q shift and as needed   Skin   Surgical incision on the neck with honeycomb dressing.   skin to be free of infection/breakdown  assess and address any skin issues q shift and as needed    Rehab Goals Patient on target to meet rehab goals: Yes Rehab Goals Revised: some OT goals are being downgraded to min assist *See Care Plan and progress notes for long and short-term goals.  Barriers to Discharge: pain, ongoing neuro deficits    Possible Resolutions to Barriers:  NMR, equipment, education    Discharge Planning/Teaching Needs:  Pt to return to his  mother's home where he lives with her and his brother.  CSW spoke with pt's brother and he will come for family education to make sure he can meet pt's needs.  [name] to come for family education on 02-08-17 from 10am-noon.   Team Discussion:  Pt is doing better today compared to yesterday in re: to his pain, but he is not regaining good arm/hand control yet and this makes personal hygiene and care difficult, even putting on his shirt.  Pt with dizzy episode today, seeing spots and had to lay down.  Pt is anxious at times with RN and they are encouraging him to take pain medications on time to help with pain and keep his anxiety down.  Pt is supervision with PT and they are working on higher level balance issues and will try to move from RW to cane, if pt is safe to do so.  Revisions to Treatment Plan:  none   Continued Need for Acute Rehabilitation Level of Care: The patient requires daily medical management by a physician with specialized training in physical medicine and rehabilitation for the following conditions: Daily direction of a multidisciplinary physical rehabilitation program to ensure safe treatment while eliciting the highest outcome that is of practical value to the patient.: Yes Daily medical management of patient stability for increased activity during participation in an intensive rehabilitation regime.: Yes Daily analysis of laboratory values and/or radiology reports with any subsequent need for medication adjustment of medical intervention for : Post surgical problems;Other  Russell Carter, Vista Deck 02/06/2017, 2:39 PM

## 2017-02-08 ENCOUNTER — Inpatient Hospital Stay (HOSPITAL_COMMUNITY): Payer: Medicaid Other

## 2017-02-08 ENCOUNTER — Inpatient Hospital Stay (HOSPITAL_COMMUNITY): Payer: Medicaid Other | Admitting: Physical Therapy

## 2017-02-08 ENCOUNTER — Encounter (HOSPITAL_COMMUNITY): Payer: Medicaid Other

## 2017-02-08 ENCOUNTER — Ambulatory Visit (HOSPITAL_COMMUNITY): Payer: Medicaid Other | Admitting: Physical Therapy

## 2017-02-08 MED ORDER — BACLOFEN 5 MG HALF TABLET
5.0000 mg | ORAL_TABLET | Freq: Three times a day (TID) | ORAL | Status: DC
  Administered 2017-02-08 – 2017-02-12 (×13): 5 mg via ORAL
  Filled 2017-02-08 (×13): qty 1

## 2017-02-08 MED ORDER — DEXAMETHASONE 2 MG PO TABS
1.0000 mg | ORAL_TABLET | Freq: Two times a day (BID) | ORAL | Status: DC
  Administered 2017-02-08 – 2017-02-09 (×2): 1 mg via ORAL
  Administered 2017-02-09: 09:00:00 via ORAL
  Administered 2017-02-10 – 2017-02-11 (×3): 1 mg via ORAL
  Filled 2017-02-08 (×6): qty 1

## 2017-02-08 NOTE — Progress Notes (Signed)
Occupational Therapy Session Note  Patient Details  Name: Russell Carter MRN: [id number] Date of Birth: [birth year]  Today's Date: 02/08/2017 OT Individual Time: 1000-1055 OT Individual Time Calculation (min): 55 min    Short Term Goals: Week 2:  OT Short Term Goal 1 (Week 2): STG=LTG seondary to ELOS  Skilled Therapeutic Interventions/Progress Updates:    Pt engaged in BADL retraining including bathing at shower level and dressing with sit<>stand from EOB.  [name] scheduled for family education but was not present. Pt required assistance with doffing his shirt but was able to doff all other clothing without assistance prior to amb without AD into bathroom for shower.  Pt is able to bathe LB without assistance while sitting and standing in shower.  Pt requires assistance with UB bathing tasks 2/2 ongoing increased traps/rhomboid pain and weakness.  Pt is able to don LB clothing without assistance and thready BUE but requires assistance with donning over head and trunk.  Pt states that pain in his shoulders increased with continuous use and requires rest.  Pt continues to initiate all tasks but requires assistance 2/2 diminished use of BUE and ongoing pain.  Pt remained in bed with all needs within reach and bed alarm activated. Focus on ongoing BADL retraining and increased use of BUE for functional tasks.  Therapy Documentation Precautions:  Precautions Precautions: Cervical, Fall Required Braces or Orthoses: Cervical Brace Cervical Brace: Hard collar Restrictions Weight Bearing Restrictions: No   Pain: Pain Assessment Pain Assessment: 0-10 Pain Score: 4  Pain Type: Acute pain;Surgical pain Pain Location: Neck Pain Descriptors / Indicators: Aching;Throbbing Pain Frequency: Constant Pain Onset: On-going Patients Stated Pain Goal: 4 Pain Intervention(s): repositioned, Muscle Rub, soft tissue mobilizations, repositioning  See Function Navigator for Current Functional  Status.   Therapy/Group: Individual Therapy  Rich Brave 02/08/2017, 10:58 AM

## 2017-02-08 NOTE — Plan of Care (Signed)
Problem: RH SKIN INTEGRITY Goal: RH STG SKIN FREE OF INFECTION/BREAKDOWN Free of breakdown and infection - mod I  Outcome: Progressing No skin issues noted, incision to posterior neck with Honey comb dressing clean, dry and intact  Problem: RH PAIN MANAGEMENT Goal: RH STG PAIN MANAGED AT OR BELOW PT'S PAIN GOAL Less than 5  Outcome: Progressing Patient stated he is satisfied with Oxycontin

## 2017-02-08 NOTE — Progress Notes (Signed)
Occupational Therapy Note  Patient Details  Name: Mohnish Mcglynn MRN: [id number] Date of Birth: Nov 18, 1955  Today's Date: 02/08/2017 OT Individual Time: 1300-1330 OT Individual Time Calculation (min): 30 min   Pt c/o 7/10 pain in B traps/rhomboids; soft tissue mobilizations, repositioned, RN aware  Individual Therapy  Pt resting in recliner upon arrival.  Focus on LUE elbow flexion with limited/minimal recruitment of traps and rhomboids to increase functional use and decrease pain.  NMES applied to L bicep to facilitate flexion with limited recruitment. Pt also engaged in shoulder flexion/extension activities to increase functional use of LUE and independence with BADLs.   Lavone Neri [facility] 02/08/2017, 2:49 PM

## 2017-02-08 NOTE — Progress Notes (Signed)
Occupational Therapy Session Note  Patient Details  Name: Nickalos Petersen MRN: [id number] Date of Birth: 1956-09-30  Today's Date: 02/08/2017 OT Individual Time: 1405-1440 OT Individual Time Calculation (min): 35 min    Short Term Goals: Week 1:  OT Short Term Goal 1 (Week 1): Pt will perform LB clothing management during toileting with steady assistance. OT Short Term Goal 1 - Progress (Week 1): Met OT Short Term Goal 2 (Week 1): Pt will perform LB dressing with mod A in order to increase I with self care. OT Short Term Goal 2 - Progress (Week 1): Progressing toward goal OT Short Term Goal 3 (Week 1): Pt will engage in 15 minutes of functional tasks with 2 rest breaks or less during this time.  OT Short Term Goal 3 - Progress (Week 1): Met OT Short Term Goal 4 (Week 1): Pt will perform UB dressing with mod A in order to increase I with self care tasks. OT Short Term Goal 4 - Progress (Week 1): Met Week 2:  OT Short Term Goal 1 (Week 2): STG=LTG seondary to ELOS      Skilled Therapeutic Interventions/Progress Updates:   Addressed endurance, functional mobility.  Pt lying in bed.    Pt ambulated with [place] to outside with cues for increased ankle strategy when walking.  Pt stated his endurance was poor.  He ambulated 100 feet before resting.  Ambulated on brick payment with cues for heel toe strategy.  Pt able to find room and remember to sign in.  No LOB during turning or stopping/starting.  Provided myotherapy to scapula and upper trap.   Left pt in room with newspaper and all needs in reach.     Therapy Documentation Precautions:  Precautions Precautions: Cervical, Fall Required Braces or Orthoses: Cervical Brace Cervical Brace: Hard collar Restrictions Weight Bearing Restrictions: No General:   Vital Signs: Therapy Vitals Temp: 98.5 F (36.9 C) Temp Source: Oral Pulse Rate: 68 Resp: 18 BP: 130/60 Patient Position (if appropriate): Lying Oxygen Therapy SpO2: 98 % O2 Device:  Not Delivered Pain:  9/10 neck, rhomboid pain.              See Function Navigator for Current Functional Status.   Therapy/Group: Individual Therapy  Lisa Roca 02/08/2017, 4:00 PM

## 2017-02-08 NOTE — Progress Notes (Signed)
Pt rested most of the night without complaint of any pain in chest or left arm area. Pt onl;y expressed pain around neck area where incisions are. After receiving pain medication patient sat on side of bed and played computer game for about an hour and then went back to sleep. Pt is still resting without complaint or distress

## 2017-02-08 NOTE — Progress Notes (Addendum)
Physical Therapy Session Note  Patient Details  Name: Morse Brueggemann MRN: [id number] Date of Birth: 14-Sep-1956  Today's Date: 02/08/2017 PT Individual Time: 900-945 PT Individual Time Calculation (min): 45 min   Short Term Goals: Week 2:  PT Short Term Goal 1 (Week 2): =LTG due to estimated LOS  Skilled Therapeutic Interventions/Progress Updates: Pt presented sitting at EOB playing games on phone agreeable to therapy. Pt stating significantly increased pain past 2 days in L periscapular area. Pt stating minimal relief with meds and hot pack. Per pt had not used tennis balls for TPR recently. Ambulated to rehab gym with [facility] and supervision. Sit to supine with supervision. Performed TPR with tennis balls  X 5 min with some relief, trailed towel roll for self mobilization of thoracic spine with immediate relief. Performed AAROM shoulder flexion in supine to fatigue. Pt able to initiate shoulder flexion in LUE x3. Performed supine to sit with supervision with pt stating relief from 8/10 to 3/10. Pt also indicating pain has increased since placement of kinesotape. PTA removed kinesotape with pt stating additional relief. NuStep then performed L8 x 10 min for endurance and strengthening. Pt returned to room in same manner as prior and returned to bed to await for next session with needs met and call bell within reach.      Therapy Documentation Precautions:  Precautions Precautions: Cervical, Fall Required Braces or Orthoses: Cervical Brace Cervical Brace: Hard collar Restrictions Weight Bearing Restrictions: No General:   Vital Signs: Therapy Vitals Temp: 98.5 F (36.9 C) Temp Source: Oral Pulse Rate: 68 Resp: 18 BP: 130/60 Patient Position (if appropriate): Lying Oxygen Therapy SpO2: 98 % O2 Device: Not Delivered Pain:     See Function Navigator for Current Functional Status.   Therapy/Group: Individual Therapy  Laneshia Pina  Jordan Caraveo, PTA  02/08/2017, 4:00 PM

## 2017-02-08 NOTE — Progress Notes (Signed)
Physical Therapy Session Note  Patient Details  Name: Russell Carter MRN: [id number] Date of Birth: Mar 10, 1956  Today's Date: 02/08/2017 PT Individual Time: 1100-1200 PT Individual Time Calculation (min): 60 min   Short Term Goals: Week 2:  PT Short Term Goal 1 (Week 2): =LTG due to estimated LOS  Skilled Therapeutic Interventions/Progress Updates: Pt received supine in bed; declines pain and agreeable to treatment. Supine>sit modI. Pt dons B shoes with assist to fasten laces, as pt's preferred method of managing shoe laces (loose and untied) deemed unsafe. Gait to/from gym with [facility] and S. Performed UE ergometer x5 min forward, 5 min backward for BUE NMR as well as strengthening and endurance; pt reports no increased pain following activity. Supine concentric shoulder flexion x8 reps to fatigue. LUE bicep eccentric control with trace bicep activation noted. Tricep extension BUE; LUE x2 sets, second set with 2# hand weight, RUE x2 sets no weight performed to fatigue with cues for slow, controlled movement. Shoulder stabilization in supine with UE in 90 degrees shoulder flexion; able to maintain neutral position with elbow bent d/t short level arm, unable to maintain straight arm d/t long lever arm and co-contraction of tricep. Minimal movement flexion/extension, abduction/adduction in 90 degree shoulder flexion to facilitate shoulder stabilizers beyond neutral. Returned to room as above. Pt applies lotion to B arms with minA d/t decreased UE coordination. Remained seated in recliner at end of session, all needs in reach.      Therapy Documentation Precautions:  Precautions Precautions: Cervical, Fall Required Braces or Orthoses: Cervical Brace Cervical Brace: Hard collar Restrictions Weight Bearing Restrictions: No   See Function Navigator for Current Functional Status.   Therapy/Group: Individual Therapy  Vista Lawman 02/08/2017, 2:53 PM

## 2017-02-08 NOTE — Progress Notes (Signed)
[place] PHYSICAL MEDICINE & REHABILITATION     PROGRESS NOTE    Subjective/Complaints: Up eating breakfast. Desperate to have a shave!!  Pain is up and down. Feels that he's having a lot of spasms in neck and shoulders. Feels like they're in "knots" at times.   ROS: pt denies nausea, vomiting, diarrhea, cough, shortness of breath or chest pain     Objective: Vital Signs: Blood pressure 121/79, pulse 80, temperature 98.7 F (37.1 C), temperature source Oral, resp. rate 18, height 5\' 11"  (1.803 m), weight 87.1 kg (192 lb 0.3 oz), SpO2 98 %. No results found.  Recent Labs  02/07/17 0505  WBC 9.7  HGB 9.6*  HCT 29.6*  PLT 284    Recent Labs  02/07/17 0505  NA 137  K 4.2  CL 104  GLUCOSE 105*  BUN 16  CREATININE 1.18  CALCIUM 8.9   CBG (last 3)  No results for input(s): GLUCAP in the last 72 hours.  Wt Readings from Last 3 Encounters:  02/06/17 87.1 kg (192 lb 0.3 oz)  01/24/17 89.8 kg (197 lb 15.6 oz)  01/21/17 90 kg (198 lb 8 oz)    Physical Exam:  Constitutional: He appears well-developed and well-nourished.  HENT:  Head: Normocephalic and atraumatic.  Eyes: EOMI.  Neck:  Cervical collar in place  Cardiovascular: RRR Respiratory: CTA B GI: Soft. Bowel sounds are normal. He exhibits no distension.  Musculoskeletal: He exhibits edema. He exhibits no tenderness.  Neurological: He is alert.  Sensation diminished to light touch in shoulders, hands, feet--stable Motor: B/l LE: 4-4+/5 proximal to distal B/L UE: Shoulder abduction 3- to 3/5, 2+5 on left.  Right elbow flexion/extension 4/5, hand 4/5 with. Left biceps 3+/5. Left triceps 4/5. HI 4-5/5. --stable motor exam Skin:  Wound c/d/I--dressing removed Psychiatric: pleasant and appropriate  Assessment/Plan: 1. Debility and functional deficits secondary to cervical spondylosis/radiculopathy (s/p C4-7 ACDF, C3-6 PCF)  which require 3+ hours per day of interdisciplinary therapy in a comprehensive  inpatient rehab setting. Physiatrist is providing close team supervision and 24 hour management of active medical problems listed below. Physiatrist and rehab team continue to assess barriers to discharge/monitor patient progress toward functional and medical goals.  Function:  Bathing Bathing position Bathing activity did not occur: N/A Position: Sitting EOB  Bathing parts Body parts bathed by patient: Chest, Abdomen, Front perineal area, Right upper leg, Left upper leg Body parts bathed by helper: Right arm, Left arm, Buttocks, Right lower leg, Left lower leg, Back  Bathing assist Assist Level:  (MOD A)      Upper Body Dressing/Undressing Upper body dressing   What is the patient wearing?: Pull over shirt/dress     Pull over shirt/dress - Perfomed by patient: Thread/unthread right sleeve, Thread/unthread left sleeve Pull over shirt/dress - Perfomed by helper: Put head through opening, Pull shirt over trunk        Upper body assist Assist Level:  (MOD A)      Lower Body Dressing/Undressing Lower body dressing   What is the patient wearing?: Pants, Socks, Shoes     Pants- Performed by patient: Thread/unthread right pants leg, Pull pants up/down, Thread/unthread left pants leg Pants- Performed by helper: Thread/unthread left pants leg Non-skid slipper socks- Performed by patient: Don/doff right sock, Don/doff left sock (foot on stool) Non-skid slipper socks- Performed by helper: Don/doff right sock, Don/doff left sock Socks - Performed by patient: Don/doff right sock, Don/doff left sock   Shoes - Performed by patient: Don/doff  left shoe Shoes - Performed by helper: Fasten right, Fasten left, Don/doff right shoe          Lower body assist Assist for lower body dressing:  (MOD A)      Toileting Toileting   Toileting steps completed by patient: Adjust clothing prior to toileting, Performs perineal hygiene, Adjust clothing after toileting Toileting steps completed by  helper: Performs perineal hygiene, Adjust clothing after toileting Toileting Assistive Devices: Grab bar or rail  Toileting assist Assist level: Touching or steadying assistance (Pt.75%)   Transfers Chair/bed transfer   Chair/bed transfer method: Ambulatory Chair/bed transfer assist level: Supervision or verbal cues Chair/bed transfer assistive device: Cane, Designer, fashion/clothing     Max distance: 150 Assist level: Supervision or verbal cues   Wheelchair   Type: Manual Max wheelchair distance: 20 Assist Level: Moderate assistance (Pt 50 - 74%)  Cognition Comprehension Comprehension assist level: Follows basic conversation/direction with no assist  Expression Expression assist level: Expresses basic needs/ideas: With no assist  Social Interaction Social Interaction assist level: Interacts appropriately 90% of the time - Needs monitoring or encouragement for participation or interaction.  Problem Solving Problem solving assist level: Solves basic problems with no assist  Memory Memory assist level: Recognizes or recalls 90% of the time/requires cueing < 10% of the time   Medical Problem List and Plan: 1.  Debilitation with neck and shoulder pain secondary to cervical spondylosis with radiculopathy. Status post C4-7 anterior discectomy and fusion, posterior cervical fusion C3-C6. Cervical collar all times  -continue PT, OT-  2.  DVT Prophylaxis/Anticoagulation: SCDs.  Dopplers negative 3. Pain Management: OxyContin sustained release 30 mg every 12 hours, Neurontin 600 mg 3 times a day, Oxycodone, Robaxin as needed  -pain seems to be improving.   -will schedule baclofen for muscle spasms 5mg  TID, titrate as needed/tolerated 4. Mood: Provide emotional support 5. Neuropsych: This patient is capable of making decisions on his own behalf. 6. Skin/Wound Care: Removed cervical dressing today 7. Fluids/Electrolytes/Nutrition:    -BUN improved  -continue to push fluids and  recheck labs monday 8. Acute blood loss anemia. Follow-up CBC with hgb 9.1   9. CAD with history of MI. Patient on aspirin and Brilinta prior to admission. 10. Diet-controlled diabetes mellitus.. Diabetic teaching. Hemoglobin A1c 6.2 11. CKD stage III. Mild ARI,  Creatinine baseline 1.43-1.54  -encourage PO fluids  -Cr 1.23---recheck monday 12. History of CVA without residual. Discuss resuming aspirin. 13. Leukocytosis: WBC's trending up  -afebrile without any clinical signs of infection  -likely steroid related--continue steroid wean  -continue to monitor      LOS (Days) 11 A FACE TO FACE EVALUATION WAS PERFORMED  Ranelle Oyster, MD 02/08/2017 9:26 AM

## 2017-02-09 ENCOUNTER — Inpatient Hospital Stay (HOSPITAL_COMMUNITY): Payer: Medicaid Other | Admitting: Occupational Therapy

## 2017-02-09 DIAGNOSIS — I2510 Atherosclerotic heart disease of native coronary artery without angina pectoris: Secondary | ICD-10-CM

## 2017-02-09 DIAGNOSIS — N189 Chronic kidney disease, unspecified: Secondary | ICD-10-CM

## 2017-02-09 DIAGNOSIS — E119 Type 2 diabetes mellitus without complications: Secondary | ICD-10-CM

## 2017-02-09 NOTE — Progress Notes (Signed)
Patient ID: Russell Carter, male   DOB: [birth year], 61 y.o.   MRN: [id number]   02/09/17.  Russell Carter is a 61 y.o. male admitted for CIR with Debilitation with neck and shoulder pain secondary to cervical spondylosis with radiculopathy. Status post C4-7 anterior discectomy and fusion, posterior cervical fusion C3-C6.   Past Medical History:  Diagnosis Date  . Anginal pain (HCC)   . Arthritis   . Blood in stool   . CAD (coronary artery disease)    a. 2011: cath showing mod non-obstructive disease b. 02/2016: cath with 95% stenosis in the Mid RCA. DES placed.  . Cervical stenosis of spine   . Cocaine abuse    history  . Depression   . Diabetes mellitus without complication (HCC)   . Gout, unspecified   . Headache   . HTN (hypertension)   . Hypercholesterolemia   . Hyperglycemia   . Migraines   . Myocardial infarction [facility]) 2008   drug cocaine and marijuana at time of mi none  since    . Pneumonia   . Renal insufficiency   . Stroke [facility])       Subjective: No new complaints. No new problems.  Still with modest neck and upper shoulder discomfort  Objective: Vital signs in last 24 hours: Temp:  [98 F (36.7 C)-98.5 F (36.9 C)] 98 F (36.7 C) (04/21 0504) Pulse Rate:  [65-68] 65 (04/21 0504) Resp:  [18] 18 (04/21 0504) BP: (130-142)/(60-93) 142/93 (04/21 0504) SpO2:  [98 %] 98 % (04/21 0504) Weight change:  Last BM Date: 02/07/17  Intake/Output from previous day: 04/20 0701 - 04/21 0700 In: 920 [P.O.:920] Out: -    Physical Exam General: No apparent distress   HEENT: not dry Neck- cervical collar in place Lungs: Normal effort. Lungs clear to auscultation, no crackles or wheezes. Cardiovascular: Regular rate and rhythm, no edema Abdomen: S/NT/ND; BS(+) Musculoskeletal:  unchanged Neurological: No new neurological deficits Wounds: Incision clean   Skin: clear  Mental state: Alert, oriented, cooperative    Lab Results: BMET    Component Value Date/Time    NA 137 02/07/2017 0505   NA 140 12/26/2016 1240   K 4.2 02/07/2017 0505   CL 104 02/07/2017 0505   CO2 23 02/07/2017 0505   GLUCOSE 105 (H) 02/07/2017 0505   BUN 16 02/07/2017 0505   BUN 20 12/26/2016 1240   CREATININE 1.18 02/07/2017 0505   CALCIUM 8.9 02/07/2017 0505   GFRNONAA >60 02/07/2017 0505   GFRAA >60 02/07/2017 0505   CBC    Component Value Date/Time   WBC 9.7 02/07/2017 0505   RBC 3.24 (L) 02/07/2017 0505   HGB 9.6 (L) 02/07/2017 0505   HCT 29.6 (L) 02/07/2017 0505   HCT 45.6 12/26/2016 1240   PLT 284 02/07/2017 0505   PLT 195 12/26/2016 1240   MCV 91.4 02/07/2017 0505   MCV 88 12/26/2016 1240   MCH 29.6 02/07/2017 0505   MCHC 32.4 02/07/2017 0505   RDW 15.4 02/07/2017 0505   RDW 15.9 (H) 12/26/2016 1240   LYMPHSABS 1.3 01/29/2017 0726   MONOABS 1.3 (H) 01/29/2017 0726   EOSABS 0.0 01/29/2017 0726   BASOSABS 0.0 01/29/2017 0726    Medications: I have reviewed the patient's current medications.  Assessment/Plan:  Status post C4-7 ACDF, C 3-6 PCF with functional deficits secondary to cervical spondylosis and radiculopathy DVD prophylaxis.  Continue SCDs Chronic kidney disease, stable Diet-controlled diabetes Remote history of CVA without residual Coronary artery disease.  Stable  Length of stay, days: 12  Rogelia Boga , MD 02/09/2017, 9:39 AM

## 2017-02-09 NOTE — Progress Notes (Signed)
Occupational Therapy Session Note  Patient Details  Name: Russell Carter MRN: [id number] Date of Birth: 1956-02-16  Today's Date: 02/09/2017 OT Individual Time: 6767-2094 OT Individual Time Calculation (min): 68 min   Skilled Therapeutic Interventions/Progress Updates: Patient participation today as follows:     Asleep in bed upon approach for OT:     Supine to EOB=supervsion;       Ambulated to toilet via single point cane [facility]) (Toilet Transfer)= Close S - especially over the threshold incline at bathroom entryway  Toileting (had BM)= supervision            Shower transfer via SPC, tub transfer bench, and grab bars= supervision  UB bathing and dressing= Mod A due to decreased bilateral upper extremity ROM and strength and somewhat decreased grip strength in digits for reaching all body parts, including face thoroughly   Shower to recliner transfer via SPC=Supervision  Patient was able to sit Edge of bed and maintain cervical precautions while RN doffed his wet collar and donned the dry one           Self feeding= setup for opening containers, raising hi - lo table to position where patient could prop his arms either on or against the table, and for cutting up eggs, waffles and stirring grits  Patient has extended length straw in order to not forward flex his neck and break cervical precautions     Therapy Documentation Precautions:  Precautions Precautions: Cervical, Fall Required Braces or Orthoses: Cervical Brace Cervical Brace: Hard collar Restrictions Weight Bearing Restrictions: No   Pain:10/10 RN gave pain meds    See Function Navigator for Current Functional Status.   Therapy/Group: Individual Therapy  Bud Face [facility] 02/09/2017, 9:52 AM

## 2017-02-09 NOTE — Progress Notes (Signed)
Occupational Therapy Session Note  Patient Details  Name: Russell Carter MRN: [id number] Date of Birth: 1956-02-15  Today's Date: 02/09/2017 OT Individual Time: 1215-1255 OT Individual Time Calculation (min): 40 min   Skilled Therapeutic Interventions/Progress Updates: Patient seen for bed mobility with close S and sat edge of bed to eat lunch.   Patient required hi-lo table heightened in order to place arms on table or lean against for stability in order to eat.    This clinician applied tubing to his fork to help with grippping his fork, dycem to keep his tray from sliding on the table and extra long straw to drink from with out breaking no flexion neck precaution  He was left sittin gEOB eating his peaches at end of session     Therapy Documentation Precautions:  Precautions Precautions: Cervical, Fall Required Braces or Orthoses: Cervical Brace Cervical Brace: Hard collar Restrictions Weight Bearing Restrictions: No  Pain   RN gave meds Pain Assessment Pain Assessment: 0-10 Pain Score: 5  Pain Location: Neck Pain Descriptors / Indicators: Aching Pain Onset: On-going Pain Intervention(s): Medication (See eMAR)    Therapy/Group: Individual Therapy  Bud Face [facility] 02/09/2017, 4:51 PM

## 2017-02-10 ENCOUNTER — Inpatient Hospital Stay (HOSPITAL_COMMUNITY): Payer: Medicaid Other

## 2017-02-10 NOTE — Progress Notes (Signed)
Patient ID: Russell Carter, male   DOB: 01-23-1956, 60 y.o.   MRN: [id number]   02/10/17.  Russell Carter is a 61 y.o. male with history of cervical spondylosis with radiculopathy.  He is status post C4-7 anterior discectomy and fusion and posterior cervical fusion C3-C6.  Patient is admitted with debilitation and functional deficits postoperatively   Subjective: No new complaints. No new problems.  Continues to have significant neck and shoulder discomfort.  Requesting escalation in the dose of his analgesics  Past Medical History:  Diagnosis Date  . Anginal pain (HCC)   . Arthritis   . Blood in stool   . CAD (coronary artery disease)    a. 2011: cath showing mod non-obstructive disease b. 02/2016: cath with 95% stenosis in the Mid RCA. DES placed.  . Cervical stenosis of spine   . Cocaine abuse    history  . Depression   . Diabetes mellitus without complication (HCC)   . Gout, unspecified   . Headache   . HTN (hypertension)   . Hypercholesterolemia   . Hyperglycemia   . Migraines   . Myocardial infarction [facility]) 2008   drug cocaine and marijuana at time of mi none  since    . Pneumonia   . Renal insufficiency   . Stroke [facility])      Objective: Vital signs in last 24 hours: Temp:  [97.7 F (36.5 C)-98.2 F (36.8 C)] 97.7 F (36.5 C) (04/22 0458) Pulse Rate:  [60-71] 60 (04/22 0458) Resp:  [18-19] 18 (04/22 0458) BP: (124-138)/(68-80) 124/68 (04/22 0458) SpO2:  [100 %] 100 % (04/22 0458) Weight change:  Last BM Date: 02/08/17  Intake/Output from previous day: 04/21 0701 - 04/22 0700 In: 480 [P.O.:480] Out: -   CBG (last 3)  No results for input(s): GLUCAP in the last 72 hours.    Physical Exam General: No apparent distress   HEENT: not dry Neck- cervical collar in place Lungs: Normal effort. Lungs clear to auscultation, no crackles or wheezes. Cardiovascular: Regular rate and rhythm, no edema Abdomen: S/NT/ND; BS(+) Musculoskeletal:  unchanged Neurological:  No new neurological deficits Wounds: Incisions clean  Skin: clear   Mental state: Alert, oriented, cooperative    Lab Results: BMET    Component Value Date/Time   NA 137 02/07/2017 0505   NA 140 12/26/2016 1240   K 4.2 02/07/2017 0505   CL 104 02/07/2017 0505   CO2 23 02/07/2017 0505   GLUCOSE 105 (H) 02/07/2017 0505   BUN 16 02/07/2017 0505   BUN 20 12/26/2016 1240   CREATININE 1.18 02/07/2017 0505   CALCIUM 8.9 02/07/2017 0505   GFRNONAA >60 02/07/2017 0505   GFRAA >60 02/07/2017 0505   CBC    Component Value Date/Time   WBC 9.7 02/07/2017 0505   RBC 3.24 (L) 02/07/2017 0505   HGB 9.6 (L) 02/07/2017 0505   HCT 29.6 (L) 02/07/2017 0505   HCT 45.6 12/26/2016 1240   PLT 284 02/07/2017 0505   PLT 195 12/26/2016 1240   MCV 91.4 02/07/2017 0505   MCV 88 12/26/2016 1240   MCH 29.6 02/07/2017 0505   MCHC 32.4 02/07/2017 0505   RDW 15.4 02/07/2017 0505   RDW 15.9 (H) 12/26/2016 1240   LYMPHSABS 1.3 01/29/2017 0726   MONOABS 1.3 (H) 01/29/2017 0726   EOSABS 0.0 01/29/2017 0726   BASOSABS 0.0 01/29/2017 0726    Medications: I have reviewed the patient's current medications.  Assessment/Plan:  Functional deficits secondary to cervical spondylosis with radiculopathy; status  post C4-7 ACDF, C 3-6 PCF DVT prophylaxis.  Continue SCDs Diet-controlled diabetes Coronary artery disease, stable History of substance abuse Stable chronic kidney disease    Length of stay, days: 13  Rogelia Boga , MD 02/10/2017, 8:49 AM

## 2017-02-10 NOTE — Progress Notes (Signed)
Occupational Therapy Session Note  Patient Details  Name: Russell Carter MRN: [id number] Date of Birth: 1956-06-24  Today's Date: 02/10/2017 OT Individual Time: 0900-1000 OT Individual Time Calculation (min): 60 min    Short Term Goals: Week 2:  OT Short Term Goal 1 (Week 2): STG=LTG seondary to ELOS  Skilled Therapeutic Interventions/Progress Updates:    Pt declined bathing/dressing this morning and requested that he wash his clothing.  Pt amb with SPC while carrying laundry bag in LUE (supervision). Pt able to empty laundry bag into washing machine and start washer without assistance.  Focus on increased BUE use for functional tasks and to increase independence with BADLs/IADLs at discharge.  Pt amb with SPC to gym and initially engaged in finger ladder activities to increase active ROM of BUE.  Pt also engaged in dynamic standing tasks while engaging BUE in table tasks.  Pt transitioned to [employer] (UE ergometer) and engaged in BUE therex.  Pt returned to room and bed with all needs within reach and bed alarm activated.   Therapy Documentation Precautions:  Precautions Precautions: Cervical, Fall Required Braces or Orthoses: Cervical Brace Cervical Brace: Hard collar Restrictions Weight Bearing Restrictions: No   Pain: Pain Assessment Pain Score: 3 B traps/rhomboids with increased shoulder flexion, RN aware, repositioned  See Function Navigator for Current Functional Status.   Therapy/Group: Individual Therapy  Rich Brave 02/10/2017, 10:03 AM

## 2017-02-10 NOTE — Progress Notes (Signed)
Pt slept well through the night. He woke at 615 with pain on 0-10 scale at 9 around his neck area. Patient was medicated and he went to the bathroom. He is back in bed resting.

## 2017-02-11 ENCOUNTER — Inpatient Hospital Stay (HOSPITAL_COMMUNITY): Payer: Medicaid Other

## 2017-02-11 ENCOUNTER — Inpatient Hospital Stay (HOSPITAL_COMMUNITY): Payer: Medicaid Other | Admitting: Physical Therapy

## 2017-02-11 LAB — BASIC METABOLIC PANEL
Anion gap: 10 (ref 5–15)
BUN: 14 mg/dL (ref 6–20)
CHLORIDE: 107 mmol/L (ref 101–111)
CO2: 23 mmol/L (ref 22–32)
CREATININE: 1.04 mg/dL (ref 0.61–1.24)
Calcium: 9 mg/dL (ref 8.9–10.3)
GFR calc non Af Amer: >60 mL/min (ref >60)
Glucose, Bld: 85 mg/dL (ref 65–99)
POTASSIUM: 3.9 mmol/L (ref 3.5–5.1)
SODIUM: 140 mmol/L (ref 135–145)

## 2017-02-11 NOTE — Progress Notes (Signed)
Occupational Therapy Discharge Summary  Patient Details  Name: Russell Carter MRN: [id number] Date of Birth: December 24, 1955  Patient has met 11 of 11 long term goals due to improved activity tolerance, improved balance, postural control, ability to compensate for deficits, functional use of  RIGHT upper and LEFT upper extremity and improved coordination. Pt made steady progress with BADLs during this admission.  Pt continues to exhibit limited BUE (shoulder) AROM greater than 30 degrees but has incorporated compensatory strategies/techniques in performing bathing/dressing tasks.  Pt requires min A for bathing/dressing tasks.  Pt performs functional transfers and toileting tasks at mod I level.  Pt educated on exercises/activities to perform at discharge.  Family has not been present for therapy sessions.  Patient to discharge at overall supervision to min A  level.  Patient's care partner is independent to provide the necessary physical assistance at discharge.      Recommendation:  Patient will benefit from ongoing skilled OT services in home health setting to continue to advance functional skills in the area of BADL and Reduce care partner burden.  Equipment:  No equipment provided Pt owns tub seat  Reasons for discharge: treatment goals met and discharge from hospital  Patient/family agrees with progress made and goals achieved: Yes  OT Discharge Vision/Perception  Vision- History Baseline Vision/History: Wears glasses Patient Visual Report: No change from baseline Vision- Assessment Eye Alignment: Within Functional Limits Perception Perception: Within Functional Limits  Cognition Overall Cognitive Status: Within Functional Limits for tasks assessed Arousal/Alertness: Awake/alert Orientation Level: Oriented X4 Attention: Sustained Sustained Attention: Appears intact Memory: Appears intact Awareness: Appears intact Problem Solving: Appears intact Safety/Judgment: Appears  intact Sensation Sensation Light Touch: Appears Intact Stereognosis: Appears Intact Hot/Cold: Appears Intact Proprioception: Appears Intact Coordination Gross Motor Movements are Fluid and Coordinated: No Fine Motor Movements are Fluid and Coordinated: No Motor  Motor Motor - Skilled Clinical Observations: BUE c5 radiculopathy, L more affected than R Mobility  Bed Mobility Bed Mobility: Supine to Sit;Sit to Supine Supine to Sit: 6: Modified independent (Device/Increase time) Sit to Supine: 6: Modified independent (Device/Increase time) Transfers Sit to Stand: 6: Modified independent (Device/Increase time)  Trunk/Postural Assessment  Cervical Assessment Cervical Assessment: Exceptions to [facility] (aspen collar) Thoracic Assessment Thoracic Assessment: Within Functional Limits Lumbar Assessment Lumbar Assessment: Within Functional Limits Postural Control Postural Control: Within Functional Limits  Balance Balance Balance Assessed: Yes Standardized Balance Assessment Standardized Balance Assessment: Berg Balance Test;Dynamic Gait Index Berg Balance Test Sit to Stand: Able to stand  independently using hands Standing Unsupported: Able to stand safely 2 minutes Sitting with Back Unsupported but Feet Supported on Floor or Stool: Able to sit safely and securely 2 minutes Stand to Sit: Sits safely with minimal use of hands Transfers: Able to transfer safely, Muhs use of hands Standing Unsupported with Eyes Closed: Able to stand 10 seconds safely Standing Ubsupported with Feet Together: Able to place feet together independently and stand for 1 minute with supervision From Standing, Reach Forward with Outstretched Arm: Reaches forward but needs supervision From Standing Position, Pick up Object from Floor: Able to pick up shoe, needs supervision From Standing Position, Turn to Look Behind Over each Shoulder: Turn sideways only but maintains balance Turn 360 Degrees: Able to turn 360  degrees safely but slowly Standing Unsupported, Alternately Place Feet on Step/Stool: Able to stand independently and safely and complete 8 steps in 20 seconds Standing Unsupported, One Foot in Front: Able to plae foot ahead of the other independently and hold 30 seconds Standing  on One Leg: Able to lift leg independently and hold 5-10 seconds Total Score: 44 Dynamic Gait Index Level Surface: Normal Change in Gait Speed: Normal Gait with Horizontal Head Turns: Normal (limited turn d/t collar) Gait with Vertical Head Turns: Normal Gait and Pivot Turn: Mild Impairment Step Over Obstacle: Mild Impairment Step Around Obstacles: Normal Steps: Normal Total Score: 22 Static Sitting Balance Static Sitting - Balance Support: Feet supported;No upper extremity supported Static Sitting - Level of Assistance: 7: Independent Dynamic Sitting Balance Dynamic Sitting - Balance Support: Feet supported;No upper extremity supported Dynamic Sitting - Level of Assistance: 6: Modified independent (Device/Increase time) Dynamic Sitting - Balance Activities: Lateral lean/weight shifting;Forward lean/weight shifting Static Standing Balance Static Standing - Level of Assistance: 6: Modified independent (Device/Increase time) Static Stance: On foam, eyes closed Static Stance: Eyes Closed: distant S x30 sec Static Stance: on Foam: S x30 sec Static Stance: on Foam, Eyes Closed: close S x30 sec with increased sway Dynamic Standing Balance Dynamic Standing - Balance Support: No upper extremity supported Dynamic Standing - Level of Assistance: 6: Modified independent (Device/Increase time) Dynamic Standing - Balance Activities: Lateral lean/weight shifting;Forward lean/weight shifting;Compliant surfaces Extremity/Trunk Assessment RUE Assessment RUE Assessment: Exceptions to [facility] (limited AROM, PROM in supine WFL) RUE AROM (degrees) Right Shoulder Flexion: 45 Degrees LUE Assessment LUE Assessment: Exceptions to  WFL (limited AROM; PROM in supine WFL) LUE AROM (degrees) Left Shoulder Flexion: 30 Degrees   See Function Navigator for Current Functional Status.  Leotis Shames [facility] 02/11/2017, 3:07 PM

## 2017-02-11 NOTE — Progress Notes (Signed)
Occupational Therapy Note  Patient Details  Name: Russell Carter MRN: [id number] Date of Birth: 04/17/1956  Today's Date: 02/11/2017 OT Individual Time: 1300-1400 OT Individual Time Calculation (min): 60 min   Pt c/o 5/10 pain in B traps/shoulders; RN aware, PROM, repositioned Individual Therapy  Pt initially engaged in grooming tasks at sink with focus on compensatory techniques/strategies and increased BUE use for functional tasks.  Pt amb with SPC to gym and engaged in BUE activities seated with focus on increased BUE use for functional tasks.  Pt educated on BUE movements/exercises in supine to increase functional use.  Pt pleased with progress and ready to discharge home tomorrow. Pt returned to room and remained seated EOB.  Pt made mod I in room and staff notified.    [facility] 02/11/2017, 2:53 PM

## 2017-02-11 NOTE — Discharge Instructions (Signed)
Inpatient Rehab Discharge Instructions  Madoc Beshears Discharge date and time: No discharge date for patient encounter.   Activities/Precautions/ Functional Status: Activity: activity as tolerated Diet: diabetic diet Wound Care: keep wound clean and dry Functional status:  ___ No restrictions     ___ Walk up steps independently ___ 24/7 supervision/assistance   ___ Walk up steps with assistance ___ Intermittent supervision/assistance  ___ Bathe/dress independently ___ Walk with walker     _x__ Bathe/dress with assistance ___ Walk Independently    ___ Shower independently ___ Walk with assistance    ___ Shower with assistance ___ No alcohol     ___ Return to work/school ________  COMMUNITY REFERRALS UPON DISCHARGE:   Home Health:   Do your home exercise program that your therapists gave to you.  You do not have a diagnosis that Medicaid will accept for therapies. Medical Equipment/Items Ordered:  Single point cane  Agency/Supplier:  [facility]        Phone:  [phone]  Special Instructions:  No driving  My questions have been answered and I understand these instructions. I will adhere to these goals and the provided educational materials after my discharge from the hospital.  Patient/Caregiver Signature _______________________________ Date __________  Clinician Signature _______________________________________ Date __________  Please bring this form and your medication list with you to all your follow-up doctor's appointments.

## 2017-02-11 NOTE — Progress Notes (Signed)
Occupational Therapy Session Note  Patient Details  Name: Russell Carter MRN: [id number] Date of Birth: June 18, 1956  Today's Date: 02/11/2017 OT Individual Time: 0900-1000 OT Individual Time Calculation (min): 60 min    Short Term Goals: Week 2:  OT Short Term Goal 1 (Week 2): STG=LTG seondary to ELOS  Skilled Therapeutic Interventions/Progress Updates:    Pt engaged in BADL retraining including bathing at shower level and dressing with sit<>stand from EOB.  Pt continues to require assistance with UB bathing/dressing tasks but exhibits improved ability to complete LB tasks without assistance.  Pt requires min A for bathing/dressing tasks.  Pt performs functional transfers at mod I level and completes toileting tasks at mod I level.  Pt pleased with progress and ready for discharge tomorrow.   Therapy Documentation Precautions:  Precautions Precautions: Cervical, Fall Required Braces or Orthoses: Cervical Brace Cervical Brace: Hard collar Restrictions Weight Bearing Restrictions: No Pain: Pain Assessment Pain Assessment: 0-10 Pain Score: 2  Pain Type: Chronic pain Pain Location: Neck Pain Frequency: Constant Pain Onset: On-going Pain Intervention(s): premedicated See Function Navigator for Current Functional Status.   Therapy/Group: Individual Therapy  Rich Brave 02/11/2017, 11:01 AM

## 2017-02-11 NOTE — Discharge Summary (Signed)
Discharge summary job # [id number]

## 2017-02-11 NOTE — Discharge Summary (Signed)
NAMEKEMP, GOMES               ACCOUNT NO.:  [id number]  MEDICAL RECORD NO.:  [id number]  LOCATION:  [id number]                        FACILITY:  [facility]  PHYSICIAN:  Ranelle Oyster, M.D.DATE OF BIRTH:  02-24-1956  DATE OF ADMISSION:  01/28/2017 DATE OF DISCHARGE:  02/12/2017                              DISCHARGE SUMMARY   DISCHARGE DIAGNOSES: 1. Debilitation secondary to cervical spondylosis with radiculopathy,     status post C4-7 anterior diskectomy and fusion. 2. SCDs for deep vein thrombosis prophylaxis. 3. Pain management. 4. Acute blood loss anemia. 5. Coronary artery disease with history of myocardial infarction. 6. Diet-controlled diabetes mellitus. 7. Chronic kidney disease, stage III. 8. History of cerebrovascular accident without residual. 9. Leukocytosis felt to be reactive.  HISTORY OF PRESENT ILLNESS:  This is a 61 year old right-handed male, history of CAD with myocardial infarction, maintained on aspirin and Brilinta; cervical stenosis; cocaine and alcohol abuse; depression; gout; diabetes mellitus; CKD, stage III.  He lives with his mother and brother, independent, working as a Engineer, materials.  Presented on January 24, 2017, due to cervical spondylosis with radiculopathy.  He had associated severe neck and shoulder pain.  He underwent C4-7 anterior diskectomy fusion, posterior cervical fusion C3-6, laminectomy C3-T1 per Dr. Bevely Palmer, cervical collar at all times.  Decadron protocol as advised. Hospital course, pain management.  Leukocytosis 13,600, monitored. Acute blood loss anemia, 9.6.  Physical and occupational therapy ongoing.  The patient was admitted for a comprehensive rehab program.  PAST MEDICAL HISTORY:  See discharge diagnoses.  SOCIAL HISTORY:  Lives with mother and brother independent prior to admission.  FUNCTIONAL STATUS:  Upon admission to Rehab Services was +2, moderate assist, 60 feet, rolling walker; minimal assist, sit to stand;  mod-to- max assist, activities of daily living.  PHYSICAL EXAMINATION:  VITAL SIGNS:  Blood pressure 125/78, pulse 75, temperature 98, respirations 20. GENERAL:  This was an alert male, in no acute distress. HEENT:  EOMs intact.  Cervical collar in place. CARDIAC:  Regular rate and rhythm.  No murmur. ABDOMEN:  Soft, nontender.  Good bowel sounds. LUNGS:  Clear to auscultation without wheeze. EXTREMITIES:  Bilateral upper extremity shoulder abduction 2/5.  Elbow flexion extension 2+/5.  Hand 4/5 with ataxia.  REHABILITATION HOSPITAL COURSE:  The patient was admitted to Inpatient Rehab Services with therapies initiated on a 3-hour daily basis consisting of physical therapy, occupational therapy and rehabilitation nursing.  The following issues were addressed during the patient's rehabilitation stay.  Pertaining to Mr. Labell debilitation related to cervical spondylosis, radiculopathy, he had undergone C4-7 anterior diskectomy and fusion, cervical collar at all times.  He would follow up with Dr. Bevely Palmer of Neurosurgery.  SCDs for DVT prophylaxis.  Venous Doppler studies negative.  Pain management with use of OxyContin sustained release 30 mg every 12 hours, Neurontin 600 mg t.i.d. and oxycodone and Robaxin as needed.  Scheduled baclofen was added of 5 mg t.i.d., titrated as tolerated to 10 mg.  No chest pain or shortness of breath. History of CAD with myocardial infarction, he remained on Brilinta and aspirin resumed.  Diabetes mellitus, diet controlled.  Hemoglobin A1c of 6.2, full diabetic teaching completed.  CKD stage III, creatinine  1.23 and stable.  History of CVA without residual.  He would continue aspirin therapy.  The patient received weekly collaborative interdisciplinary team conferences to discuss estimated length of stay, family teaching, any barriers to his discharge.  Working with activity tolerance.  His balance was much improved, improved postural control, strength,  decrease in pain.  He continued with his cervical collar.  Supine to sit, modified independent.  Ambulates to the rehab gymnasium with a straight- point cane and supervision.  He could gather his belongings for activities of daily living and homemaking.  Ambulates on brick pavement with cues, no loss of balance.  Full family teaching was completed and plan discharge to home.  DISCHARGE MEDICATIONS: 1. Baclofen 10 mg p.o. t.i.d. 2. Colace 100 mg p.o. b.i.d. 3. Neurontin 800 mg p.o. t.i.d. 4. OxyContin sustained release 30 mg every 12 hours. 5. Protonix 40 mg at bedtime. 6. Senokot twice daily. 7. Brilinta 90 mg p.o. b.i.d. 8. Robaxin 750 mg p.o. every 6 hours as needed muscle spasms. 9. Oxycodone immediate release 5-10 mg every 3 hours as needed pain. 10. Norvasc 5 mg daily 11. HCTZ 25 mg daily 12. Cozaar 100 mg daily   DIET:  Diabetic diet.  SPECIAL INSTRUCTIONS:  Cervical collar at all times.  The patient would follow with Dr. Bevely Palmer, Neurosurgery, call for appointment, [facility], medical management; Dr. Faith Rogue, as directed.     Mariam Dollar, P.A.   ______________________________ Ranelle Oyster, M.D.    DA/MEDQ  D:  02/11/2017  T:  02/11/2017  Job:  [id number]  cc:   Cherrie Distance, M.D.

## 2017-02-11 NOTE — Progress Notes (Signed)
[facility] PHYSICAL MEDICINE & REHABILITATION     PROGRESS NOTE    Subjective/Complaints: Up at bedside. Still wants a shave! Pain seems to be under control. Anticipating going home tomorrow  ROS: pt denies nausea, vomiting, diarrhea, cough, shortness of breath or chest pain     Objective: Vital Signs: Blood pressure 119/81, pulse 66, temperature 97.9 F (36.6 C), temperature source Oral, resp. rate 18, height 5\' 11"  (1.803 m), weight 87.1 kg (192 lb 0.3 oz), SpO2 100 %. No results found. No results for input(s): WBC, HGB, HCT, PLT in the last 72 hours. No results for input(s): NA, K, CL, GLUCOSE, BUN, CREATININE, CALCIUM in the last 72 hours.  Invalid input(s): CO CBG (last 3)  No results for input(s): GLUCAP in the last 72 hours.  Wt Readings from Last 3 Encounters:  02/06/17 87.1 kg (192 lb 0.3 oz)  01/24/17 89.8 kg (197 lb 15.6 oz)  01/21/17 90 kg (198 lb 8 oz)    Physical Exam:  Constitutional: He appears well-developed and well-nourished.  HENT:  Head: Normocephalic and atraumatic.  Eyes: EOMI.  Neck:  Cervical collar in place  Cardiovascular: RRR Respiratory: CTA b GI: Soft. Bowel sounds are normal. He exhibits no distension.  Musculoskeletal: He exhibits edema. He exhibits no tenderness.  Neurological: He is alert.  Sensation diminished to light touch in shoulders, hands, feet--stable Motor: B/l LE: 4-4+/5 proximal to distal B/L UE: Shoulder abduction 3- to 3/5, 2+5 on left.  Right elbow flexion/extension 4/5, hand 4/5 with. Left biceps 3/5. Left triceps 4/5. HI 4+ to 5/5. --able to feed himself with right upper ext Skin:  Wound c/d/I--  Psychiatric: pleasant and appropriate  Assessment/Plan: 1. Debility and functional deficits secondary to cervical spondylosis/radiculopathy (s/p C4-7 ACDF, C3-6 PCF)  which require 3+ hours per day of interdisciplinary therapy in a comprehensive inpatient rehab setting. Physiatrist is providing close team supervision and  24 hour management of active medical problems listed below. Physiatrist and rehab team continue to assess barriers to discharge/monitor patient progress toward functional and medical goals.  Function:  Bathing Bathing position Bathing activity did not occur: N/A Position: Shower  Bathing parts Body parts bathed by patient: Front perineal area, Buttocks, Right upper leg, Left upper leg, Right lower leg, Left lower leg, Abdomen Body parts bathed by helper: Right arm, Left arm, Chest  Bathing assist Assist Level:  (MOD A)      Upper Body Dressing/Undressing Upper body dressing   What is the patient wearing?: Pull over shirt/dress     Pull over shirt/dress - Perfomed by patient: Thread/unthread right sleeve, Thread/unthread left sleeve Pull over shirt/dress - Perfomed by helper: Put head through opening, Pull shirt over trunk        Upper body assist Assist Level:  (MOD A)      Lower Body Dressing/Undressing Lower body dressing   What is the patient wearing?: Pants, Socks, Shoes     Pants- Performed by patient: Thread/unthread right pants leg, Pull pants up/down, Thread/unthread left pants leg Pants- Performed by helper: Thread/unthread left pants leg Non-skid slipper socks- Performed by patient: Don/doff right sock, Don/doff left sock (foot on stool) Non-skid slipper socks- Performed by helper: Don/doff right sock, Don/doff left sock Socks - Performed by patient: Don/doff right sock, Don/doff left sock   Shoes - Performed by patient: Don/doff left shoe Shoes - Performed by helper: Fasten right, Fasten left, Don/doff right shoe          Lower body assist Assist for  lower body dressing:  (MOD A)      Toileting Toileting   Toileting steps completed by patient: Adjust clothing prior to toileting, Performs perineal hygiene, Adjust clothing after toileting Toileting steps completed by helper: Performs perineal hygiene, Adjust clothing after toileting Toileting Assistive  Devices: Grab bar or rail  Toileting assist Assist level: Touching or steadying assistance (Pt.75%)   Transfers Chair/bed transfer   Chair/bed transfer method: Ambulatory Chair/bed transfer assist level: Supervision or verbal cues Chair/bed transfer assistive device: Cane, Designer, fashion/clothing     Max distance: 100 Assist level: Supervision or verbal cues   Wheelchair   Type: Manual Max wheelchair distance: 20 Assist Level: Moderate assistance (Pt 50 - 74%)  Cognition Comprehension Comprehension assist level: Follows basic conversation/direction with no assist  Expression Expression assist level: Expresses basic needs/ideas: With no assist  Social Interaction Social Interaction assist level: Interacts appropriately 90% of the time - Needs monitoring or encouragement for participation or interaction.  Problem Solving Problem solving assist level: Solves basic problems with no assist  Memory Memory assist level: Recognizes or recalls 90% of the time/requires cueing < 10% of the time   Medical Problem List and Plan: 1.  Debilitation with neck and shoulder pain secondary to cervical spondylosis with radiculopathy. Status post C4-7 anterior discectomy and fusion, posterior cervical fusion C3-C6. Cervical collar all times  -continue PT, OT-   -working toward discharge tomorrow  -family ed 2.  DVT Prophylaxis/Anticoagulation: SCDs.  Dopplers negative 3. Pain Management: OxyContin sustained release 30 mg every 12 hours, Neurontin 600 mg 3 times a day, Oxycodone, Robaxin as needed  -pain seems to be improving.   -scheduled baclofen for muscle spasms 5mg  TID, titrate as needed/tolerated 4. Mood: Provide emotional support 5. Neuropsych: This patient is capable of making decisions on his own behalf. 6. Skin/Wound Care: Removed cervical dressing today 7. Fluids/Electrolytes/Nutrition:    -BUN improved  -continue to push fluids  -labs pending today 8. Acute blood loss  anemia. Follow-up CBC with hgb 9.1   9. CAD with history of MI. Patient on aspirin and Brilinta prior to admission. 10. Diet-controlled diabetes mellitus.. Diabetic teaching. Hemoglobin A1c 6.2 11. CKD stage III. Mild ARI,  Creatinine baseline 1.43-1.54  -encourage PO fluids  -Cr 1.23---most recently 12. History of CVA without residual. Discuss resuming aspirin. 13. Leukocytosis: WBC's trending up  -afebrile without any clinical signs of infection  -likely steroid related--dc decadron today  -continue to monitor      LOS (Days) 14 A FACE TO FACE EVALUATION WAS PERFORMED  Ranelle Oyster, MD 02/11/2017 8:37 AM

## 2017-02-11 NOTE — Progress Notes (Addendum)
Physical Therapy Discharge Summary  Patient Details  Name: Russell Carter MRN: [id number] Date of Birth: September 25, 1956  Today's Date: 02/11/2017 PT Individual Time: 1045-1200 PT Individual Time Calculation (min): 75 min    Patient has met 11 of 11 long term goals due to improved activity tolerance, improved balance, improved postural control, increased strength, decreased pain, ability to compensate for deficits, functional use of  right upper extremity, right lower extremity, left upper extremity and left lower extremity and improved coordination.  Patient to discharge at an ambulatory level Supervision.   Patient's care partner did not attend scheduled family education session; pt able to direct care for assist needed with ADLs, and is modI from a mobility perspective in the home.   Reasons goals not met: All goals met  Recommendation:  Patient will benefit from ongoing skilled PT services in home health setting to continue to advance safe functional mobility, address ongoing impairments in strength, coordination, balance, activity tolerance, and minimize fall risk. Pt likely does not qualify for follow up PT services; therapist has provided pt with extensive LE strengthening and balance HEP to allow continued progression towards long term goals of independence.  Equipment: SPC  Reasons for discharge: treatment goals met and discharge from hospital  Patient/family agrees with progress made and goals achieved: Yes  PT Discharge Precautions/Restrictions Precautions Precautions: Cervical;Fall Required Braces or Orthoses: Cervical Brace Cervical Brace: Hard collar Restrictions Weight Bearing Restrictions: No Pain Pain Assessment Pain Assessment: 0-10 Pain Score: 2  Pain Type: Chronic pain Pain Location: Neck Pain Frequency: Constant Pain Onset: On-going Pain Intervention(s): Medication (See eMAR) Vision/Perception  Perception Perception: Within Functional Limits   Cognition Orientation Level: Oriented X4 Sensation Sensation Light Touch: Appears Intact Stereognosis: Appears Intact Hot/Cold: Appears Intact Proprioception: Appears Intact Coordination Gross Motor Movements are Fluid and Coordinated: No Fine Motor Movements are Fluid and Coordinated: No Motor  Motor Motor - Skilled Clinical Observations: BUE c5 radiculopathy, L more affected than R  Mobility Bed Mobility Bed Mobility: Supine to Sit;Sit to Supine Supine to Sit: 6: Modified independent (Device/Increase time) Sit to Supine: 6: Modified independent (Device/Increase time) Transfers Transfers: Yes Sit to Stand: 6: Modified independent (Device/Increase time) Stand Pivot Transfers: 6: Modified independent (Device/Increase time) Locomotion  Ambulation Ambulation: Yes Ambulation/Gait Assistance: 6: Modified independent (Device/Increase time) Ambulation Distance (Feet): 300 Feet Assistive device: Straight cane Gait Gait: Yes Gait Pattern: Impaired Gait Pattern: Narrow base of support;Step-through pattern;Decreased stride length Gait velocity: 3.0 ft/sec High Level Ambulation High Level Ambulation: Side stepping;Backwards walking;Direction changes;Sudden stops;Head turns Side Stepping: modI Backwards Walking: S Direction Changes: modI Sudden Stops: modI Head Turns: unable d/t collar Stairs / Additional Locomotion Stairs: Yes Stairs Assistance: 5: Supervision Stairs Assistance Details: Verbal cues for precautions/safety Stair Management Technique: Two rails;Alternating pattern;Forwards Number of Stairs: 12 Height of Stairs: 6 Ramp: 5: Supervision Curb: 5: Supervision Wheelchair Mobility Wheelchair Mobility: No  Trunk/Postural Assessment  Cervical Assessment Cervical Assessment: Exceptions to [facility] (aspen collar) Thoracic Assessment Thoracic Assessment: Within Functional Limits Lumbar Assessment Lumbar Assessment: Within Functional Limits Postural Control Postural  Control: Within Functional Limits  Balance Balance Balance Assessed: Yes Standardized Balance Assessment Standardized Balance Assessment: Berg Balance Test;Dynamic Gait Index Berg Balance Test Sit to Stand: Able to stand  independently using hands Standing Unsupported: Able to stand safely 2 minutes Sitting with Back Unsupported but Feet Supported on Floor or Stool: Able to sit safely and securely 2 minutes Stand to Sit: Sits safely with minimal use of hands Transfers: Able to transfer safely, Feltes  use of hands Standing Unsupported with Eyes Closed: Able to stand 10 seconds safely Standing Ubsupported with Feet Together: Able to place feet together independently and stand for 1 minute with supervision From Standing, Reach Forward with Outstretched Arm: Reaches forward but needs supervision From Standing Position, Pick up Object from Floor: Able to pick up shoe, needs supervision From Standing Position, Turn to Look Behind Over each Shoulder: Turn sideways only but maintains balance Turn 360 Degrees: Able to turn 360 degrees safely but slowly Standing Unsupported, Alternately Place Feet on Step/Stool: Able to stand independently and safely and complete 8 steps in 20 seconds Standing Unsupported, One Foot in Front: Able to plae foot ahead of the other independently and hold 30 seconds Standing on One Leg: Able to lift leg independently and hold 5-10 seconds Total Score: 44 Dynamic Gait Index Level Surface: Normal Change in Gait Speed: Normal Gait with Horizontal Head Turns: Normal (limited turn d/t collar) Gait with Vertical Head Turns: Normal Gait and Pivot Turn: Mild Impairment Step Over Obstacle: Mild Impairment Step Around Obstacles: Normal Steps: Normal Total Score: 22 Static Sitting Balance Static Sitting - Balance Support: Feet supported;No upper extremity supported Static Sitting - Level of Assistance: 6: Modified independent (Device/Increase time) Dynamic Sitting  Balance Dynamic Sitting - Balance Support: Feet supported;No upper extremity supported Dynamic Sitting - Level of Assistance: 6: Modified independent (Device/Increase time) Dynamic Sitting - Balance Activities: Lateral lean/weight shifting;Forward lean/weight shifting Static Standing Balance Static Standing - Level of Assistance: 6: Modified independent (Device/Increase time) Static Stance: On foam, eyes closed Static Stance: Eyes Closed: distant S x30 sec Static Stance: on Foam: S x30 sec Static Stance: on Foam, Eyes Closed: close S x30 sec with increased sway Dynamic Standing Balance Dynamic Standing - Balance Support: No upper extremity supported Dynamic Standing - Level of Assistance: 6: Modified independent (Device/Increase time) Dynamic Standing - Balance Activities: Lateral lean/weight shifting;Forward lean/weight shifting;Compliant surfaces Extremity Assessment  RUE Assessment RUE Assessment: Exceptions to [facility] (limited AROM, PROM in supine WFL) RUE AROM (degrees) Right Shoulder Flexion: 45 Degrees LUE Assessment LUE Assessment: Exceptions to WFL (limited AROM; PROM in supine WFL) LUE AROM (degrees) Left Shoulder Flexion: 30 Degrees RLE Assessment RLE Assessment: Within Functional Limits (5/5 throughout) LLE Assessment LLE Assessment: Within Functional Limits (5/5 throughout)  Skilled Therapeutic Intervention: Pt received seated on EOB; c/o pain as above and agreeable to treatment. Assessed all mobility as described above with modI overall using SPC. Performed floor transfer with modI. Reviewed LE strengthening and balance HEP including [place] A, B and C with min cues for technique and reps/sets. Pt with no further questions/concerns regarding d/c home at this time. Remained semi-reclined in bed at end of session, all needs in reach.    See Function Navigator for Current Functional Status.  Benjiman Core Tygielski 02/11/2017, 11:54 AM

## 2017-02-12 MED ORDER — OXYCODONE HCL 5 MG PO TABS: 5.0000 mg | ORAL_TABLET | ORAL | 0 refills | Status: DC | PRN

## 2017-02-12 MED ORDER — GABAPENTIN 400 MG PO CAPS: 800.0000 mg | ORAL_CAPSULE | Freq: Three times a day (TID) | ORAL | 1 refills | Status: DC

## 2017-02-12 MED ORDER — DOCUSATE SODIUM 100 MG PO CAPS: 100.0000 mg | ORAL_CAPSULE | Freq: Two times a day (BID) | ORAL | 0 refills | Status: DC

## 2017-02-12 MED ORDER — OXYCODONE HCL ER 30 MG PO T12A: 30.0000 mg | EXTENDED_RELEASE_TABLET | Freq: Two times a day (BID) | ORAL | 0 refills | Status: DC

## 2017-02-12 MED ORDER — PANTOPRAZOLE SODIUM 40 MG PO TBEC: 40.0000 mg | DELAYED_RELEASE_TABLET | Freq: Every day | ORAL | 1 refills | Status: DC

## 2017-02-12 MED ORDER — METHOCARBAMOL 750 MG PO TABS: 750.0000 mg | ORAL_TABLET | Freq: Four times a day (QID) | ORAL | 0 refills | Status: DC | PRN

## 2017-02-12 MED ORDER — BACLOFEN 10 MG PO TABS
10.0000 mg | ORAL_TABLET | Freq: Three times a day (TID) | ORAL | Status: DC
  Administered 2017-02-12: 10 mg via ORAL
  Filled 2017-02-12: qty 1

## 2017-02-12 MED ORDER — BACLOFEN 10 MG PO TABS: 10.0000 mg | ORAL_TABLET | Freq: Three times a day (TID) | ORAL | 0 refills | Status: DC

## 2017-02-12 NOTE — Progress Notes (Signed)
Pt discharged to home with son at 60. Discharge instructions given to pt by Deatra Ina, PA with verbal understanding.

## 2017-02-12 NOTE — Progress Notes (Signed)
[place] PHYSICAL MEDICINE & REHABILITATION     PROGRESS NOTE    Subjective/Complaints: Feeling well. Had a good day yesterday. Got a shave!!. Excited to go home. Feels prepared  ROS: pt denies nausea, vomiting, diarrhea, cough, shortness of breath or chest pain      Objective: Vital Signs: Blood pressure 134/76, pulse (!) 59, temperature 98 F (36.7 C), temperature source Oral, resp. rate 19, height 5\' 11"  (1.803 m), weight 87.1 kg (192 lb 0.3 oz), SpO2 100 %. No results found. No results for input(s): WBC, HGB, HCT, PLT in the last 72 hours.  Recent Labs  02/11/17 1226  NA 140  K 3.9  CL 107  GLUCOSE 85  BUN 14  CREATININE 1.04  CALCIUM 9.0   CBG (last 3)  No results for input(s): GLUCAP in the last 72 hours.  Wt Readings from Last 3 Encounters:  02/06/17 87.1 kg (192 lb 0.3 oz)  01/24/17 89.8 kg (197 lb 15.6 oz)  01/21/17 90 kg (198 lb 8 oz)    Physical Exam:  Constitutional: He appears well-developed and well-nourished.  HENT:  Head: Normocephalic and atraumatic.  Eyes: EOMI.  Neck:  Cervical collar in place  Cardiovascular: RRR Respiratory: CTA B GI: Soft. Bowel sounds are normal. He exhibits no distension.  Musculoskeletal: He exhibits edema. He exhibits no tenderness.  Neurological: He is alert.  Sensation diminished to light touch in shoulders, hands, feet-unchanged Motor: B/l LE: 4-4+/5 proximal to distal B/L UE: Shoulder abduction 3- to 3/5, 2+5 on left.  Right elbow flexion/extension 4/5, hand 4/5 with. Left biceps 3/5. Left triceps 4/5. HI 4+ to 5/5. --able to feed himself with right upper ext Skin:  Wound c/d/I--  Psychiatric: pleasant and appropriate  Assessment/Plan: 1. Debility and functional deficits secondary to cervical spondylosis/radiculopathy (s/p C4-7 ACDF, C3-6 PCF)  which require 3+ hours per day of interdisciplinary therapy in a comprehensive inpatient rehab setting. Physiatrist is providing close team supervision and 24 hour  management of active medical problems listed below. Physiatrist and rehab team continue to assess barriers to discharge/monitor patient progress toward functional and medical goals.  Function:  Bathing Bathing position Bathing activity did not occur: N/A Position: Shower  Bathing parts Body parts bathed by patient: Chest, Abdomen, Front perineal area, Buttocks, Right upper leg, Left upper leg, Right lower leg, Left lower leg Body parts bathed by helper: Right arm, Left arm, Chest  Bathing assist Assist Level: Touching or steadying assistance(Pt > 75%)      Upper Body Dressing/Undressing Upper body dressing   What is the patient wearing?: Pull over shirt/dress     Pull over shirt/dress - Perfomed by patient: Thread/unthread right sleeve, Thread/unthread left sleeve, Pull shirt over trunk Pull over shirt/dress - Perfomed by helper: Put head through opening        Upper body assist Assist Level:  (MOD A)      Lower Body Dressing/Undressing Lower body dressing   What is the patient wearing?: Underwear, Pants, Socks, Shoes Underwear - Performed by patient: Thread/unthread right underwear leg, Thread/unthread left underwear leg, Pull underwear up/down   Pants- Performed by patient: Thread/unthread right pants leg, Thread/unthread left pants leg, Pull pants up/down Pants- Performed by helper: Thread/unthread left pants leg Non-skid slipper socks- Performed by patient: Don/doff right sock, Don/doff left sock (foot on stool) Non-skid slipper socks- Performed by helper: Don/doff right sock, Don/doff left sock Socks - Performed by patient: Don/doff right sock, Don/doff left sock   Shoes - Performed by patient:  Don/doff left shoe Shoes - Performed by helper: Fasten right, Fasten left          Lower body assist Assist for lower body dressing: Touching or steadying assistance (Pt > 75%)      Toileting Toileting   Toileting steps completed by patient: Adjust clothing prior to  toileting, Performs perineal hygiene, Adjust clothing after toileting Toileting steps completed by helper: Performs perineal hygiene, Adjust clothing after toileting Toileting Assistive Devices: Grab bar or rail  Toileting assist Assist level: Supervision or verbal cues   Transfers Chair/bed transfer   Chair/bed transfer method: Ambulatory Chair/bed transfer assist level: No Help, no cues, assistive device, takes more than a reasonable amount of time Chair/bed transfer assistive device: Armrests, Hospital doctor     Max distance: 300 Assist level: No help, No cues, assistive device, takes more than a reasonable amount of time   Wheelchair   Type: Manual Max wheelchair distance: 20 Assist Level: Moderate assistance (Pt 50 - 74%)  Cognition Comprehension Comprehension assist level: Follows basic conversation/direction with no assist  Expression Expression assist level: Expresses basic needs/ideas: With no assist  Social Interaction Social Interaction assist level: Interacts appropriately 90% of the time - Needs monitoring or encouragement for participation or interaction.  Problem Solving Problem solving assist level: Solves basic problems with no assist  Memory Memory assist level: Recognizes or recalls 90% of the time/requires cueing < 10% of the time   Medical Problem List and Plan: 1.  Debilitation with neck and shoulder pain secondary to cervical spondylosis with radiculopathy. Status post C4-7 anterior discectomy and fusion, posterior cervical fusion C3-C6. Cervical collar all times  -continue PT, OT-   -home today  Patient to see MD in the office for transitional care encounter in 1-2 weeks. 2.  DVT Prophylaxis/Anticoagulation: SCDs.  Dopplers negative 3. Pain Management: OxyContin sustained release 30 mg every 12 hours, Neurontin 600 mg 3 times a day, Oxycodone, Robaxin as needed  -pain seems to be improving.   -scheduled baclofen for muscle spasms 5mg  TID,  titrate to 10mg  TID today 4. Mood: Provide emotional support 5. Neuropsych: This patient is capable of making decisions on his own behalf. 6. Skin/Wound Care: Removed cervical dressing today 7. Fluids/Electrolytes/Nutrition:    -BUN improved 8. Acute blood loss anemia. Follow-up CBC with hgb 9.1   9. CAD with history of MI. Patient on aspirin and Brilinta prior to admission. 10. Diet-controlled diabetes mellitus.. Diabetic teaching. Hemoglobin A1c 6.2 11. CKD stage III. Mild ARI,  Creatinine baseline 1.43-1.54  -encourage PO fluids  -Cr 1.23---most recently 12. History of CVA without residual. Discuss resuming aspirin. 13. Leukocytosis: WBC's trending up  -afebrile without any clinical signs of infection  -likely steroid related--dc decadron today  -continue to monitor      LOS (Days) 15 A FACE TO FACE EVALUATION WAS PERFORMED  Ranelle Oyster, MD 02/12/2017 8:47 AM

## 2017-02-13 ENCOUNTER — Telehealth: Payer: Self-pay

## 2017-02-13 NOTE — Telephone Encounter (Signed)
Transitional Care call  Patient name: ( Russell Carter  ) DOB: ( 1956/03/12  ) 1. Are you/is patient experiencing any problems since coming home? ( N  ) a. Are there any questions regarding any aspect of care? ( N  ) 2. Are there any questions regarding medications administration/dosing? ( N  ) a. Are meds being taken as prescribed? ( Y  ) b. "Patient should review meds with caller to confirm"  3. Have there been any falls? ( N  ) 4. Has Home Health been to the house and/or have they contacted you? ( Y  ) a. If not, have you tried to contact them? ( N  ) b. Can we help you contact them? ( N  ) 5. Are bowels and bladder emptying properly? ( Y  ) a. Are there any unexpected incontinence issues? ( N  ) b. If applicable, is patient following bowel/bladder programs? ( N  ) 6. Any fevers, problems with breathing, unexpected pain? ( N  ) 7. Are there any skin problems or new areas of breakdown? ( N  ) 8. Has the patient/family member arranged specialty MD follow up (ie cardiology/neurology/renal/surgical/etc.)?  ( N  ) a. Can we help arrange? ( N  ) 9. Does the patient need any other services or support that we can help arrange? ( N  ) 10. Are caregivers following through as expected in assisting the patient? ( N  ) 11. Has the patient quit smoking, drinking alcohol, or using drugs as recommended? ( N/A  )  Appointment date/time( 02-21-2017 / 1120AM  ), arrive time( 1100AM  )and who it is with here( Dr. Allena Katz / then Riley Kill on next apt.) [address]

## 2017-02-14 ENCOUNTER — Encounter (HOSPITAL_COMMUNITY): Payer: Self-pay | Admitting: Emergency Medicine

## 2017-02-14 ENCOUNTER — Inpatient Hospital Stay (HOSPITAL_COMMUNITY)
Admission: EM | Admit: 2017-02-14 | Discharge: 2017-02-17 | DRG: 300 | Disposition: A | Discharge status: Home / Self Care | Payer: Medicaid Other | Attending: Internal Medicine | Admitting: Internal Medicine

## 2017-02-14 ENCOUNTER — Emergency Department (HOSPITAL_COMMUNITY): Payer: Medicaid Other

## 2017-02-14 ENCOUNTER — Ambulatory Visit: Payer: Medicaid Other | Admitting: Pharmacist

## 2017-02-14 DIAGNOSIS — Z7902 Long term (current) use of antithrombotics/antiplatelets: Secondary | ICD-10-CM

## 2017-02-14 DIAGNOSIS — I2510 Atherosclerotic heart disease of native coronary artery without angina pectoris: Secondary | ICD-10-CM | POA: Diagnosis present

## 2017-02-14 DIAGNOSIS — Z955 Presence of coronary angioplasty implant and graft: Secondary | ICD-10-CM

## 2017-02-14 DIAGNOSIS — E119 Type 2 diabetes mellitus without complications: Secondary | ICD-10-CM

## 2017-02-14 DIAGNOSIS — I10 Essential (primary) hypertension: Secondary | ICD-10-CM | POA: Diagnosis present

## 2017-02-14 DIAGNOSIS — I808 Phlebitis and thrombophlebitis of other sites: Secondary | ICD-10-CM | POA: Diagnosis present

## 2017-02-14 DIAGNOSIS — I2699 Other pulmonary embolism without acute cor pulmonale: Secondary | ICD-10-CM | POA: Diagnosis present

## 2017-02-14 DIAGNOSIS — Z86711 Personal history of pulmonary embolism: Secondary | ICD-10-CM | POA: Diagnosis present

## 2017-02-14 DIAGNOSIS — I2782 Chronic pulmonary embolism: Secondary | ICD-10-CM | POA: Diagnosis not present

## 2017-02-14 DIAGNOSIS — F1411 Cocaine abuse, in remission: Secondary | ICD-10-CM | POA: Diagnosis present

## 2017-02-14 DIAGNOSIS — Z8673 Personal history of transient ischemic attack (TIA), and cerebral infarction without residual deficits: Secondary | ICD-10-CM

## 2017-02-14 DIAGNOSIS — I119 Hypertensive heart disease without heart failure: Secondary | ICD-10-CM | POA: Diagnosis present

## 2017-02-14 DIAGNOSIS — Z79891 Long term (current) use of opiate analgesic: Secondary | ICD-10-CM

## 2017-02-14 DIAGNOSIS — I82442 Acute embolism and thrombosis of left tibial vein: Secondary | ICD-10-CM | POA: Diagnosis present

## 2017-02-14 DIAGNOSIS — Y838 Other surgical procedures as the cause of abnormal reaction of the patient, or of later complication, without mention of misadventure at the time of the procedure: Secondary | ICD-10-CM

## 2017-02-14 DIAGNOSIS — T81718A Complication of other artery following a procedure, not elsewhere classified, initial encounter: Principal | ICD-10-CM | POA: Diagnosis present

## 2017-02-14 DIAGNOSIS — M5412 Radiculopathy, cervical region: Secondary | ICD-10-CM | POA: Diagnosis present

## 2017-02-14 DIAGNOSIS — E785 Hyperlipidemia, unspecified: Secondary | ICD-10-CM | POA: Diagnosis present

## 2017-02-14 DIAGNOSIS — M109 Gout, unspecified: Secondary | ICD-10-CM | POA: Diagnosis present

## 2017-02-14 DIAGNOSIS — Z79899 Other long term (current) drug therapy: Secondary | ICD-10-CM

## 2017-02-14 DIAGNOSIS — Z981 Arthrodesis status: Secondary | ICD-10-CM

## 2017-02-14 DIAGNOSIS — Z91013 Allergy to seafood: Secondary | ICD-10-CM

## 2017-02-14 HISTORY — DX: Personal history of pulmonary embolism: Z86.711

## 2017-02-14 LAB — CBC
HEMATOCRIT: 32.7 % — AB (ref 39.0–52.0)
HEMOGLOBIN: 10.3 g/dL — AB (ref 13.0–17.0)
MCH: 28.5 pg (ref 26.0–34.0)
MCHC: 31.5 g/dL (ref 30.0–36.0)
MCV: 90.3 fL (ref 78.0–100.0)
Platelets: 304 10*3/uL (ref 150–400)
RBC: 3.62 MIL/uL — ABNORMAL LOW (ref 4.22–5.81)
RDW: 14.6 % (ref 11.5–15.5)
WBC: 8.4 10*3/uL (ref 4.0–10.5)

## 2017-02-14 LAB — HEPARIN LEVEL (UNFRACTIONATED): Heparin Unfractionated: 0.6 IU/mL (ref 0.30–0.70)

## 2017-02-14 LAB — BASIC METABOLIC PANEL
Anion gap: 8 (ref 5–15)
BUN: 9 mg/dL (ref 6–20)
CHLORIDE: 106 mmol/L (ref 101–111)
CO2: 25 mmol/L (ref 22–32)
Calcium: 8.9 mg/dL (ref 8.9–10.3)
Creatinine, Ser: 1.08 mg/dL (ref 0.61–1.24)
GFR calc Af Amer: >60 mL/min (ref >60)
GFR calc non Af Amer: >60 mL/min (ref >60)
GLUCOSE: 118 mg/dL — AB (ref 65–99)
POTASSIUM: 3.3 mmol/L — AB (ref 3.5–5.1)
Sodium: 139 mmol/L (ref 135–145)

## 2017-02-14 LAB — I-STAT TROPONIN, ED
Troponin i, poc: 0 ng/mL (ref 0.00–0.08)
Troponin i, poc: 0 ng/mL (ref 0.00–0.08)

## 2017-02-14 LAB — GLUCOSE, CAPILLARY
GLUCOSE-CAPILLARY: 105 mg/dL — AB (ref 65–99)
GLUCOSE-CAPILLARY: 127 mg/dL — AB (ref 65–99)

## 2017-02-14 LAB — RAPID URINE DRUG SCREEN, HOSP PERFORMED
AMPHETAMINES: NOT DETECTED
Barbiturates: NOT DETECTED
Benzodiazepines: NOT DETECTED
COCAINE: NOT DETECTED
OPIATES: POSITIVE — AB
TETRAHYDROCANNABINOL: NOT DETECTED

## 2017-02-14 LAB — PROTIME-INR
INR: 1.16
Prothrombin Time: 14.8 seconds (ref 11.4–15.2)

## 2017-02-14 MED ORDER — BACLOFEN 10 MG PO TABS
10.0000 mg | ORAL_TABLET | Freq: Three times a day (TID) | ORAL | Status: DC
  Administered 2017-02-14 – 2017-02-17 (×9): 10 mg via ORAL
  Filled 2017-02-14 (×9): qty 1

## 2017-02-14 MED ORDER — PROMETHAZINE HCL 25 MG PO TABS: 12.5000 mg | ORAL_TABLET | Freq: Four times a day (QID) | ORAL | Status: DC | PRN

## 2017-02-14 MED ORDER — HYDROCHLOROTHIAZIDE 25 MG PO TABS
25.0000 mg | ORAL_TABLET | Freq: Every day | ORAL | Status: DC
  Administered 2017-02-14 – 2017-02-17 (×4): 25 mg via ORAL
  Filled 2017-02-14 (×4): qty 1

## 2017-02-14 MED ORDER — LOSARTAN POTASSIUM 50 MG PO TABS
100.0000 mg | ORAL_TABLET | Freq: Every day | ORAL | Status: DC
  Administered 2017-02-15 – 2017-02-17 (×3): 100 mg via ORAL
  Filled 2017-02-14 (×3): qty 2

## 2017-02-14 MED ORDER — IOPAMIDOL (ISOVUE-370) INJECTION 76%
INTRAVENOUS | Status: AC
  Administered 2017-02-14: 100 mL
  Filled 2017-02-14: qty 100

## 2017-02-14 MED ORDER — ACETAMINOPHEN 325 MG PO TABS: 650.0000 mg | ORAL_TABLET | Freq: Four times a day (QID) | ORAL | Status: DC | PRN

## 2017-02-14 MED ORDER — HYDROCODONE-ACETAMINOPHEN 7.5-325 MG PO TABS
1.0000 | ORAL_TABLET | ORAL | Status: DC | PRN
  Administered 2017-02-14 – 2017-02-17 (×8): 2 via ORAL
  Filled 2017-02-14 (×8): qty 2

## 2017-02-14 MED ORDER — ASPIRIN 81 MG PO TBEC: 81.0000 mg | DELAYED_RELEASE_TABLET | Freq: Every day | ORAL | Status: DC

## 2017-02-14 MED ORDER — TICAGRELOR 90 MG PO TABS
90.0000 mg | ORAL_TABLET | Freq: Two times a day (BID) | ORAL | Status: DC
  Administered 2017-02-14 – 2017-02-15 (×2): 90 mg via ORAL
  Filled 2017-02-14 (×2): qty 1

## 2017-02-14 MED ORDER — ASPIRIN EC 81 MG PO TBEC
81.0000 mg | DELAYED_RELEASE_TABLET | Freq: Every day | ORAL | Status: DC
  Administered 2017-02-14 – 2017-02-15 (×2): 81 mg via ORAL
  Filled 2017-02-14 (×2): qty 1

## 2017-02-14 MED ORDER — LOSARTAN POTASSIUM 50 MG PO TABS: 100.0000 mg | ORAL_TABLET | ORAL | Status: DC

## 2017-02-14 MED ORDER — HYDROCODONE-ACETAMINOPHEN 7.5-325 MG PO TABS
1.0000 | ORAL_TABLET | Freq: Four times a day (QID) | ORAL | Status: DC | PRN
  Administered 2017-02-14: 1 via ORAL
  Filled 2017-02-14: qty 1

## 2017-02-14 MED ORDER — AMLODIPINE BESYLATE 5 MG PO TABS
5.0000 mg | ORAL_TABLET | Freq: Every evening | ORAL | Status: DC
  Administered 2017-02-14 – 2017-02-16 (×3): 5 mg via ORAL
  Filled 2017-02-14 (×4): qty 1

## 2017-02-14 MED ORDER — ADULT MULTIVITAMIN W/MINERALS CH
1.0000 | ORAL_TABLET | Freq: Every day | ORAL | Status: DC
  Administered 2017-02-14 – 2017-02-17 (×4): 1 via ORAL
  Filled 2017-02-14 (×4): qty 1

## 2017-02-14 MED ORDER — NITROGLYCERIN 0.4 MG SL SUBL: 0.4000 mg | SUBLINGUAL_TABLET | SUBLINGUAL | Status: DC | PRN

## 2017-02-14 MED ORDER — ALLOPURINOL 300 MG PO TABS
300.0000 mg | ORAL_TABLET | Freq: Every day | ORAL | Status: DC
  Administered 2017-02-14 – 2017-02-17 (×4): 300 mg via ORAL
  Filled 2017-02-14 (×4): qty 1

## 2017-02-14 MED ORDER — ROSUVASTATIN CALCIUM 10 MG PO TABS
40.0000 mg | ORAL_TABLET | Freq: Every day | ORAL | Status: DC
  Administered 2017-02-14 – 2017-02-17 (×4): 40 mg via ORAL
  Filled 2017-02-14 (×4): qty 4

## 2017-02-14 MED ORDER — ACETAMINOPHEN 650 MG RE SUPP: 650.0000 mg | Freq: Four times a day (QID) | RECTAL | Status: DC | PRN

## 2017-02-14 MED ORDER — SENNOSIDES-DOCUSATE SODIUM 8.6-50 MG PO TABS
1.0000 | ORAL_TABLET | Freq: Every day | ORAL | Status: DC
  Administered 2017-02-14 – 2017-02-16 (×3): 1 via ORAL
  Filled 2017-02-14 (×3): qty 1

## 2017-02-14 MED ORDER — INSULIN ASPART 100 UNIT/ML ~~LOC~~ SOLN
0.0000 [IU] | Freq: Three times a day (TID) | SUBCUTANEOUS | Status: DC
  Administered 2017-02-15 – 2017-02-16 (×2): 1 [IU] via SUBCUTANEOUS

## 2017-02-14 MED ORDER — HEPARIN (PORCINE) IN NACL 100-0.45 UNIT/ML-% IJ SOLN
1500.0000 [IU]/h | INTRAMUSCULAR | Status: DC
  Administered 2017-02-14 – 2017-02-15 (×3): 1500 [IU]/h via INTRAVENOUS
  Filled 2017-02-14 (×4): qty 250

## 2017-02-14 MED ORDER — HEPARIN (PORCINE) IN NACL 100-0.45 UNIT/ML-% IJ SOLN: 15.0000 [IU]/kg/h | Freq: Once | INTRAMUSCULAR | Status: DC

## 2017-02-14 MED ORDER — HEPARIN BOLUS VIA INFUSION
5000.0000 [IU] | Freq: Once | INTRAVENOUS | Status: AC
  Administered 2017-02-14: 5000 [IU] via INTRAVENOUS
  Filled 2017-02-14: qty 5000

## 2017-02-14 MED ORDER — ONDANSETRON HCL 4 MG/2ML IJ SOLN
4.0000 mg | Freq: Once | INTRAMUSCULAR | Status: AC
Start: 2017-02-14 — End: 2017-02-14
  Administered 2017-02-14: 4 mg via INTRAVENOUS
  Filled 2017-02-14: qty 2

## 2017-02-14 MED ORDER — MORPHINE SULFATE (PF) 4 MG/ML IV SOLN
4.0000 mg | INTRAVENOUS | Status: AC | PRN
  Administered 2017-02-14 (×3): 4 mg via INTRAVENOUS
  Filled 2017-02-14 (×3): qty 1

## 2017-02-14 MED ORDER — HEPARIN SODIUM (PORCINE) 5000 UNIT/ML IJ SOLN: 4000.0000 [IU] | Freq: Once | INTRAMUSCULAR | Status: DC

## 2017-02-14 MED ORDER — GABAPENTIN 400 MG PO CAPS
800.0000 mg | ORAL_CAPSULE | Freq: Three times a day (TID) | ORAL | Status: DC
  Administered 2017-02-14 – 2017-02-17 (×10): 800 mg via ORAL
  Filled 2017-02-14 (×10): qty 2

## 2017-02-14 NOTE — ED Notes (Signed)
Attempted report x 2 

## 2017-02-14 NOTE — ED Notes (Signed)
Pt stated he was unable to give a urine sample at this time.  

## 2017-02-14 NOTE — ED Triage Notes (Signed)
Pt BIB EMS from home. C/o chest pain that feels like pressure for the past hour. Also reports L arm pain. Reports SOB, but bilat LS clear for EMS. Received 324mg  ASA, 2x nitro. Pain has decr'd from 10/10 to 8/10. Hx of MI in 2008. Back surgery on 01/24/17 with incr'd back pain the past couple days as well.

## 2017-02-14 NOTE — Progress Notes (Signed)
ANTICOAGULATION CONSULT NOTE - FOLLOW UP    HL = 0.6 (goal 0.3 - 0.7 units/mL) Heparin dosing weight = 88 kg   Assessment: 61 YOM with new PE to continue on IV heparin.  Patient underwent a multilevel laminectomy fusion 3 weeks ago.  Heparin level is therapeutic; no bleeding reported.  Currently on RA.   Plan: - Continue heparin gtt at 1500 units/hr - F/U AM labs   Russell Carter D. Laney Potash, PharmD, BCPS 02/14/2017, 7:27 PM

## 2017-02-14 NOTE — ED Notes (Signed)
Patient transported to CT 

## 2017-02-14 NOTE — H&P (Signed)
Date: 02/14/2017               Patient Name:  Russell Carter MRN: [id number]  DOB: [birth year] Age / Sex: 62 y.o., male   PCP: No Pcp Per Patient         Medical Service: Internal Medicine Teaching Service         Attending Physician: Dr. Gust Rung, DO    First Contact: Dr. Nelson Chimes Pager: [phone]  Second Contact: Dr. Georgeann Oppenheim Pager: [phone]       After Hours (After 5p/  First Contact Pager: [phone]  weekends / holidays): Second Contact Pager: [phone]   Chief Complaint: Left-sided chest and left arm pain.  History of Present Illness: Russell Carter is a 61 y.o. man with PMHx significant for diabetes, hypertension, CAD s/p PCI of RCA in 2017, prior cocaine abuse, cervical stenosis s/p multilevel laminectomy fusion for bilateral C5 radiculopathy 3 weeks ago,Brought to ED with left arm, hand and left sided pleuritic chest pain started around 12:30 PM today, associated with shortness of breath.  Patient recently had cervical laminectomy on 01/24/2017-recently discharged from rehabilitation on 02/11/2017. Around 12:30 PM he experience left-sided chest pain, increased with deep breathing, radiating to left arm and hand, associated with some shortness of breath, and left hand tingling and numbness. His chest pain resolved-still having 8/10 left hand and arm pain associated with numbness, stating that this pain is similar to his prior pain due to cervical radiculopathy. He denies any palpitation, diaphoresis, orthopnea or PND. Denies any fever or hemoptysis.  He remained on aspirin and brilinta after the surgery as DVT prophylaxis.  He was also complaining of watery diarrhea yesterday, had 8-9 episodes and resolved today.  He denies any nausea, vomiting, fever, recent change in his weight or appetite, dysuria or hematuria.  In ED his vitals were stable, saturating well on room air, afebrile, CTA shows left pulmonary embolism, he was admitted for further management.  Meds:  Current Meds   Medication Sig  . allopurinol (ZYLOPRIM) 300 MG tablet Take 1 tablet (300 mg total) by mouth daily.  Marland Kitchen amLODipine (NORVASC) 5 MG tablet Take 1 tablet (5 mg total) by mouth daily. (Patient taking differently: Take 5 mg by mouth every evening. )  . aspirin EC 81 MG EC tablet Take 1 tablet (81 mg total) by mouth daily.  . baclofen (LIORESAL) 10 MG tablet Take 1 tablet (10 mg total) by mouth 3 (three) times daily.  Marland Kitchen CALCIUM-VITAMIN D PO Take 1 tablet by mouth daily.  Marland Kitchen gabapentin (NEURONTIN) 400 MG capsule Take 2 capsules (800 mg total) by mouth 3 (three) times daily.  . hydrochlorothiazide (HYDRODIURIL) 25 MG tablet Take 1 tablet (25 mg total) by mouth daily.  Marland Kitchen losartan (COZAAR) 100 MG tablet Take 100 mg by mouth every morning.   . Multiple Vitamin (MULTIVITAMIN WITH MINERALS) TABS tablet Take 1 tablet by mouth daily.  . rosuvastatin (CRESTOR) 40 MG tablet Take 1 tablet (40 mg total) by mouth daily.  . ticagrelor (BRILINTA) 90 MG TABS tablet Take 1 tablet (90 mg total) by mouth 2 (two) times daily.     Allergies: Allergies as of 02/14/2017 - Review Complete 02/14/2017  Allergen Reaction Noted  . Shellfish allergy Anaphylaxis 12/04/2011   Past Medical History:  Diagnosis Date  . Anginal pain (HCC)   . Arthritis   . Blood in stool   . CAD (coronary artery disease)    a. 2011: cath showing mod non-obstructive disease b.  02/2016: cath with 95% stenosis in the Mid RCA. DES placed.  . Cervical stenosis of spine   . Cocaine abuse    history  . Depression   . Diabetes mellitus without complication (HCC)   . Gout, unspecified   . Headache   . HTN (hypertension)   . Hypercholesterolemia   . Hyperglycemia   . Migraines   . Myocardial infarction [facility]) 2008   drug cocaine and marijuana at time of mi none  since    . Pneumonia   . Renal insufficiency   . Stroke [facility])     Family History: Noncontributory  Social History: Nonsmoker, occasionally drink alcohol, last drink was before his  surgery on 01/24/2017. Used to have cocaine but stopped doing it since 2008 after having an MI due to cocaine use.  Review of Systems: A complete ROS was negative except as per HPI.   Physical Exam: Blood pressure (!) 160/94, pulse 76, temperature 98.7 F (37.1 C), temperature source Oral, resp. rate 18, height 5\' 11"  (1.803 m), weight 175 lb 7.8 oz (79.6 kg), SpO2 100 %. Vitals:   02/14/17 1500 02/14/17 1530 02/14/17 1600 02/14/17 1709  BP: (!) 164/99 (!) 160/98 (!) 149/99 (!) 160/94  Pulse: 93 87 87 76  Resp: 16 (!) 22 (!) 22 18  Temp:    98.7 F (37.1 C)  TempSrc:    Oral  SpO2: 96% 94% 95% 100%  Weight:    175 lb 7.8 oz (79.6 kg)  Height:    5\' 11"  (1.803 m)   General: Vital signs reviewed.  Patient is well-developed and well-nourished, in no acute distress and cooperative with exam.  Head: Normocephalic and atraumatic. Eyes: EOMI, conjunctivae normal, no scleral icterus.  Neck: Neck collar, trachea midline, no JVD, masses, thyromegaly, or carotid bruit present.  Cardiovascular: RRR, S1 normal, S2 normal, no murmurs, gallops, or rubs. Pulmonary/Chest: Clear to auscultation bilaterally, no wheezes, rales, or rhonchi. Abdominal: Soft, non-tender, non-distended, BS +, no masses, organomegaly, or guarding present.  Extremities: Left arm and hand tenderness, no erythema or edema, warm skin, pulses intact, patient was unable to participate in strength testing because of pain.No lower extremity edema or tenderness bilaterally,  pulses symmetric and intact bilaterally. No cyanosis or clubbing. Neurological: A&O x3, Strength is grossly normal , unable to assess and left hand and arm due to pain, cranial nerve II-XII are grossly intact, sensory intact to light touch bilaterally.  Skin: Warm, dry and intact. No rashes or erythema. Psychiatric: Normal mood and affect. speech and behavior is normal. Cognition and memory are normal.  Labs. CBC Latest Ref Rng & Units 02/14/2017 02/07/2017  01/29/2017  WBC 4.0 - 10.5 K/uL 8.4 9.7 14.6(H)  Hemoglobin 13.0 - 17.0 g/dL 10.3(L) 9.6(L) 9.1(L)  Hematocrit 39.0 - 52.0 % 32.7(L) 29.6(L) 28.2(L)  Platelets 150 - 400 K/uL 304 284 303   BMP Latest Ref Rng & Units 02/14/2017 02/11/2017 02/07/2017  Glucose 65 - 99 mg/dL 161(W) 85 960(A)  BUN 6 - 20 mg/dL 9 14 16   Creatinine 0.61 - 1.24 mg/dL 5.40 9.81 1.91  BUN/Creat Ratio 10 - 24 - - -  Sodium 135 - 145 mmol/L 139 140 137  Potassium 3.5 - 5.1 mmol/L 3.3(L) 3.9 4.2  Chloride 101 - 111 mmol/L 106 107 104  CO2 22 - 32 mmol/L 25 23 23   Calcium 8.9 - 10.3 mg/dL 8.9 9.0 8.9   Drugs of Abuse     Component Value Date/Time   LABOPIA POSITIVE (A) 02/14/2017  1156   COCAINSCRNUR NONE DETECTED 02/14/2017 1156   COCAINSCRNUR NEG 03/24/2012 0947   LABBENZ NONE DETECTED 02/14/2017 1156   LABBENZ NEG 03/24/2012 0947   AMPHETMU NONE DETECTED 02/14/2017 1156   THCU NONE DETECTED 02/14/2017 1156   LABBARB NONE DETECTED 02/14/2017 1156    Troponin. 0.00 X 2.  EKG:   CXR: FINDINGS: There is shallow inspiration. Left lung base linear and platelike atelectasis. There is no focal consolidation, pleural effusion, or pneumothorax. The cardiac silhouette is within normal limits. There has been interval placement of cervical spine fusion hardware. No acute osseous pathology.  IMPRESSION: Shallow inspiration with bibasilar atelectatic changes. No focal consolidation.  CTA Chest. FINDINGS: Cardiovascular: The study is positive for acute pulmonary thromboembolism. There is linear low-density filling defect extending from the main left pulmonary artery into left upper and lower lobar branches. There is no evidence of right heart strain. No evidence of aortic aneurysm or dissection.  Mediastinum/Nodes: No abnormal mediastinal adenopathy. Visualized thyroid is normal. No pericardial effusion.  Lungs/Pleura: Tiny left pleural effusion. Dependent atelectasis towards the lung bases. There is more confluent  pulmonary opacity at the left lung base over the left hemidiaphragm. Again, volume loss is favored over pulmonary infarct.  Upper Abdomen: No acute abnormality.  Musculoskeletal: Fusion hardware is in place extending from the cervical spine to T1. There is no vertebral compression deformity  Review of the MIP images confirms the above findings.  IMPRESSION: The study is positive for acute left pulmonary thromboembolism as described.    Assessment & Plan by Problem:   Russell Carter is a 61 y.o. man with PMHx significant for diabetes, hypertension, CAD s/p PCI of RCA in 2017, prior cocaine abuse, cervical stenosis s/p multilevel laminectomy fusion for bilateral C5 radiculopathy 3 weeks ago,Brought to ED with left arm, hand and left sided pleuritic chest pain started around 12:30 PM today, associated with shortness of breath. Found to have PE on CTA.  Pulmonary embolism (HCC). He was found to have left pulmonary embolism on CTA, extending from left main pulmonary to upper and lower lobar branches. He has a risk factor of recent surgery and increased mobility since then. He was on aspirin and brilinta, never received any Lovenox or heparin for DVT prophylaxis. He is having a very tender left arm and hand, no evidence of any erythema or edema. Patient remained stable, saturating well on room air, no obvious right ventricular strain. -Admit to telemetry. -Left arm ultrasound to rule out any DVT as a source of his PE. -Heparin infusion. -Norco for pain when needed.   History of CAD. He had 2 cardiac caths in the past. In 2011 it shows moderate nonobstructive disease and again in May 2017-shows 95% stenosis in the mid RCA followed by PCI. His current chest pain is more pleuritic in nature most likely due to pulmonary embolism. troponin was negative and EKG was without any acute change.   Hypertension. Currently mildly elevated blood pressure. -His amlodipine was recently decreased in  March 2018 to 5 mg daily-as during office visit he was complaining of excessive tiredness and had mildly softer blood pressure. His home dose of hydrochlorothiazide 25 mg and losartan 100 mg was continued. -Continue current home dose of amlodipine 5 mg daily, hydrochlorothiazide 25 mg daily and losartan 100 mg daily. -Monitor blood pressure.  Diabetes. Seems well controlled, last A1c done on 01/21/2017 was 6.2. He is not on any medicine for his diabetes. -A.m. CBGs -SSI Can be done if needed.  Cervical  radiculopathy. S/P laminectomy. -According to neurosurgery patient should remain in collar all the time. -Norco for pain if needed.  CODE STATUS. Full Diet. Carb modified.  Dispo: Admit patient to Observation with expected length of stay less than 2 midnights.  Signed: Arnetha Courser, MD 02/14/2017, 5:29 PM  Pager: [phone]

## 2017-02-14 NOTE — ED Provider Notes (Signed)
[facility]-EMERGENCY DEPT Provider Note   CSN: [id number] Arrival date & time: 02/14/17  4098     History   Chief Complaint Chief Complaint  Patient presents with  . Chest Pain    HPI Russell Carter is a 61 y.o. male. CC: Arm pain, as well as left chest pain.  HPI:  61 year old male who presents to emergency room with a chief complaint of left arm pain. Has a second complaint he states he has pain in his left chest. Patient was just discharged from [facility] health rehabilitation yesterday to home. He was 3 weeks status post multilevel laminectomy fusion for bilateral C5 radiculopathy. Ultimately, he states he is out of pain medicine and his last dose was 8:00 last night. He states he has been having bilateral arm pain before and after surgery. He states today was "the worst is been sent surgery". Also states she has left chest pain that is sharp and pleuritic. Does not feel short of breath. Has not had cough hemoptysis or sputum production. No fever shakes or chills.  Patient has history of single vessel RCA disease status post stenting in 2017. Remainder of his heart Showed no other culprit lesions. Was seen and evaluated several months before his surgery by his Cardiologist, Dr. Eden Emms.  Prior cocaine use.  Past Medical History:  Diagnosis Date  . Anginal pain (HCC)   . Arthritis   . Blood in stool   . CAD (coronary artery disease)    a. 2011: cath showing mod non-obstructive disease b. 02/2016: cath with 95% stenosis in the Mid RCA. DES placed.  . Cervical stenosis of spine   . Cocaine abuse    history  . Depression   . Diabetes mellitus without complication (HCC)   . Gout, unspecified   . Headache   . HTN (hypertension)   . Hypercholesterolemia   . Hyperglycemia   . Migraines   . Myocardial infarction [facility]) 2008   drug cocaine and marijuana at time of mi none  since    . Pneumonia   . Renal insufficiency   . Stroke [facility])     Patient Active Problem List   Diagnosis Date  Noted  . Debility   . Post-operative pain   . Radicular pain   . Diabetes mellitus type 2 in nonobese (HCC)   . Surgery, elective   . Postoperative pain   . Leukocytosis   . Thrombocytopenia (HCC)   . Acute blood loss anemia   . History of myocardial infarction   . Cocaine abuse   . Depression   . Type 2 diabetes mellitus with hyperglycemia, without long-term current use of insulin (HCC)   . Stage 3 chronic kidney disease   . Benign essential HTN   . History of CVA (cerebrovascular accident)   . Prediabetes   . Cervical spondylosis with radiculopathy 01/24/2017  . Dyslipidemia 03/15/2016  . Unstable angina (HCC) - chest pain with complete heart block & chest pain 03/15/2016  . Complete heart block (HCC) 03/14/2016  . Cervical spinal stenosis 03/14/2016  . Chest pain with moderate risk of acute coronary syndrome 03/13/2016  . CAD-50% LAD 2011 12/14/2014  . Gout 11/16/2014  . Headache 07/15/2013  . Polysubstance abuse 01/15/2013  . DIZZINESS 06/05/2010  . Essential hypertension 05/18/2009  . Acute myocardial infarction (HCC) 05/18/2009  . MUSCLE SPASM 05/18/2009    Past Surgical History:  Procedure Laterality Date  . ANTERIOR CERVICAL DECOMP/DISCECTOMY FUSION N/A 01/24/2017   Procedure: Cervical Four-Five,Cervical Five Six,Cervical Six-Seven Anterior  cervical discectomy with fusion and plate with Cervical Three-Thoracic- One Laminectomy/Foraminotomy with Cervical Three-Thoracic- One Fixation and fusion;  Surgeon: Loura Halt Ditty, MD;  Location: [facility] OR;  Service: Neurosurgery;  Laterality: N/A;  Part-1 anterior approach  . CARDIAC CATHETERIZATION  2008; ~ 2011   [place]-40%lad  . CARDIAC CATHETERIZATION N/A 03/16/2016   Procedure: Left Heart Cath and Coronary Angiography;  Surgeon: Marykay Lex, MD;  Location: [facility] INVASIVE CV LAB;  Service: Cardiovascular;  Laterality: N/A;  . CORONARY ANGIOPLASTY     STENTS  . EYE SURGERY Bilateral    "for double vision"  . INGUINAL  HERNIA REPAIR  05/12/2012   Procedure: HERNIA REPAIR INGUINAL ADULT;  Surgeon: Liz Malady, MD;  Location: [facility];  Service: General;  Laterality: Right;  . INGUINAL HERNIA REPAIR Left 02/02/2003   Dr. Violeta Gelinas. repair with mesh. Same procedure done again in  05/22/2004   . INGUINAL HERNIA REPAIR Left ?date 2nd OR  . POSTERIOR CERVICAL FUSION/FORAMINOTOMY N/A 01/24/2017   Procedure: Cervical Three-Thoracic One-Laminectomy/Foraminotomy with Cervical Three-Thoracic One Fixation and fusion;  Surgeon: Loura Halt Ditty, MD;  Location: [facility] OR;  Service: Neurosurgery;  Laterality: N/A;  Part-2 Posterior approach  . RECONSTRUCT / STABILIZE DISTAL ULNA    . SHOULDER OPEN ROTATOR CUFF REPAIR Left 2010  . SHOULDER SURGERY Left    "injured on the job; had to cut small bone out"  . THUMB FUSION Right        Home Medications    Prior to Admission medications   Medication Sig Start Date End Date Taking? Authorizing Provider  allopurinol (ZYLOPRIM) 300 MG tablet Take 1 tablet (300 mg total) by mouth daily. 03/17/16  Yes [place] M Strader, PA-C  amLODipine (NORVASC) 5 MG tablet Take 1 tablet (5 mg total) by mouth daily. Patient taking differently: Take 5 mg by mouth every evening.  01/18/17  Yes Wendall Stade, MD  aspirin EC 81 MG EC tablet Take 1 tablet (81 mg total) by mouth daily. 03/17/16  Yes Lennart Pall Strader, PA-C  baclofen (LIORESAL) 10 MG tablet Take 1 tablet (10 mg total) by mouth 3 (three) times daily. 02/12/17  Yes Daniel J Angiulli, PA-C  CALCIUM-VITAMIN D PO Take 1 tablet by mouth daily.   Yes Historical Provider, MD  gabapentin (NEURONTIN) 400 MG capsule Take 2 capsules (800 mg total) by mouth 3 (three) times daily. 02/12/17  Yes Daniel J Angiulli, PA-C  hydrochlorothiazide (HYDRODIURIL) 25 MG tablet Take 1 tablet (25 mg total) by mouth daily. 03/29/16  Yes Leone Brand, NP  losartan (COZAAR) 100 MG tablet Take 100 mg by mouth every morning.  01/04/17  Yes Wendall Stade, MD  Multiple Vitamin (MULTIVITAMIN WITH MINERALS) TABS tablet Take 1 tablet by mouth daily.   Yes Historical Provider, MD  rosuvastatin (CRESTOR) 40 MG tablet Take 1 tablet (40 mg total) by mouth daily. 01/04/17 04/04/17 Yes Wendall Stade, MD  ticagrelor (BRILINTA) 90 MG TABS tablet Take 1 tablet (90 mg total) by mouth 2 (two) times daily. 01/31/17  Yes Loura Halt Ditty, MD  docusate sodium (COLACE) 100 MG capsule Take 1 capsule (100 mg total) by mouth 2 (two) times daily. Patient not taking: Reported on 02/14/2017 02/12/17   Mcarthur Rossetti Angiulli, PA-C  methocarbamol (ROBAXIN) 750 MG tablet Take 1 tablet (750 mg total) by mouth every 6 (six) hours as needed for muscle spasms. Patient not taking: Reported on 02/14/2017 02/12/17   Mcarthur Rossetti Angiulli, PA-C  nitroGLYCERIN (NITROSTAT) 0.4 MG SL tablet Place 1 tablet (0.4 mg total) under the tongue every 5 (five) minutes as needed for chest pain. 03/29/16   Leone Brand, NP  oxyCODONE (OXY IR/ROXICODONE) 5 MG immediate release tablet Take 1-2 tablets (5-10 mg total) by mouth every 3 (three) hours as needed for breakthrough pain. Patient not taking: Reported on 02/14/2017 02/12/17   Mcarthur Rossetti Angiulli, PA-C  oxyCODONE 30 MG 12 hr tablet Take 30 mg by mouth every 12 (twelve) hours. Patient not taking: Reported on 02/14/2017 02/12/17   Mcarthur Rossetti Angiulli, PA-C  pantoprazole (PROTONIX) 40 MG tablet Take 1 tablet (40 mg total) by mouth at bedtime. Patient not taking: Reported on 02/14/2017 02/12/17   Mcarthur Rossetti Angiulli, PA-C    Family History Family History  Problem Relation Age of Onset  . Heart attack Mother     x7. Still alive  . Heart attack Father     deceased b/c of MI  . Heart attack Brother     older, deceased b/c of MI     Social History Social History  Substance Use Topics  . Smoking status: Never Smoker  . Smokeless tobacco: Never Used  . Alcohol use 13.8 oz/week    23 Cans of beer per week     Comment: 03/14/2016 "40oz beer/day"      Allergies   Shellfish allergy   Review of Systems Review of Systems  Constitutional: Negative for appetite change, chills, diaphoresis, fatigue and fever.  HENT: Negative for mouth sores, sore throat and trouble swallowing.   Eyes: Negative for visual disturbance.  Respiratory: Negative for cough, chest tightness, shortness of breath and wheezing.   Cardiovascular: Positive for chest pain.  Gastrointestinal: Negative for abdominal distention, abdominal pain, diarrhea, nausea and vomiting.  Endocrine: Negative for polydipsia, polyphagia and polyuria.  Genitourinary: Negative for dysuria, frequency and hematuria.  Musculoskeletal: Negative for gait problem.       Lt arm pain  Skin: Negative for color change, pallor and rash.  Neurological: Negative for dizziness, syncope, light-headedness and headaches.  Hematological: Does not bruise/bleed easily.  Psychiatric/Behavioral: Negative for behavioral problems and confusion.     Physical Exam Updated Vital Signs BP (!) 166/99   Pulse 88   Resp (!) 21   Ht 5\' 11"  (1.803 m)   Wt 193 lb (87.5 kg)   SpO2 98%   BMI 26.92 kg/m   Physical Exam  Constitutional: He is oriented to person, place, and time. He appears well-developed and well-nourished. No distress.  HENT:  Head: Normocephalic.  Eyes: Conjunctivae are normal. Pupils are equal, round, and reactive to light. No scleral icterus.  Neck: Normal range of motion. Neck supple. No thyromegaly present.  Cardiovascular: Normal rate and regular rhythm.  Exam reveals no gallop and no friction rub.   No murmur heard. Pulmonary/Chest: Effort normal and breath sounds normal. No respiratory distress. He has no wheezes. He has no rales.  Pain in left mid anterior chest. No vesicles. Normal breath sounds. Non tender.  Abdominal: Soft. Bowel sounds are normal. He exhibits no distension. There is no tenderness. There is no rebound.  Musculoskeletal: Normal range of motion.  No swelling  or asymmetry to the lower extremities clinically no signs of DVT  Neurological: He is alert and oriented to person, place, and time.  Skin: Skin is warm and dry. No rash noted.  Psychiatric: He has a normal mood and affect. His behavior is normal.     ED Treatments /  Results  Labs (all labs ordered are listed, but only abnormal results are displayed) Labs Reviewed  BASIC METABOLIC PANEL - Abnormal; Notable for the following:       Result Value   Potassium 3.3 (*)    Glucose, Bld 118 (*)    All other components within normal limits  CBC - Abnormal; Notable for the following:    RBC 3.62 (*)    Hemoglobin 10.3 (*)    HCT 32.7 (*)    All other components within normal limits  PROTIME-INR  RAPID URINE DRUG SCREEN, HOSP PERFORMED  HEPARIN LEVEL (UNFRACTIONATED)  I-STAT TROPOININ, ED  Rosezena Sensor, ED    EKG  EKG Interpretation None       Radiology Dg Chest 2 View  Result Date: 02/14/2017 CLINICAL DATA:  61 year old male with chest pain and shortness of breath. EXAM: CHEST  2 VIEW COMPARISON:  Chest radiograph dated 03/13/2016 FINDINGS: There is shallow inspiration. Left lung base linear and platelike atelectasis. There is no focal consolidation, pleural effusion, or pneumothorax. The cardiac silhouette is within normal limits. There has been interval placement of cervical spine fusion hardware. No acute osseous pathology. IMPRESSION: Shallow inspiration with bibasilar atelectatic changes. No focal consolidation. Electronically Signed   By: Elgie Collard M.D.   On: 02/14/2017 06:48   Ct Angio Chest Pe W Or Wo Contrast  Result Date: 02/14/2017 CLINICAL DATA:  Left chest pain. Left shoulder pain. Symptoms since last night. Recent cervical spine surgery, April 5th. EXAM: CT ANGIOGRAPHY CHEST WITH CONTRAST TECHNIQUE: Multidetector CT imaging of the chest was performed using the standard protocol during bolus administration of intravenous contrast. Multiplanar CT image  reconstructions and MIPs were obtained to evaluate the vascular anatomy. CONTRAST:  100 cc Isovue 370 COMPARISON:  None. FINDINGS: Cardiovascular: The study is positive for acute pulmonary thromboembolism. There is linear low-density filling defect extending from the main left pulmonary artery into left upper and lower lobar branches. There is no evidence of right heart strain. No evidence of aortic aneurysm or dissection. Mediastinum/Nodes: No abnormal mediastinal adenopathy. Visualized thyroid is normal. No pericardial effusion. Lungs/Pleura: Tiny left pleural effusion. Dependent atelectasis towards the lung bases. There is more confluent pulmonary opacity at the left lung base over the left hemidiaphragm. Again, volume loss is favored over pulmonary infarct. Upper Abdomen: No acute abnormality. Musculoskeletal: Fusion hardware is in place extending from the cervical spine to T1. There is no vertebral compression deformity Review of the MIP images confirms the above findings. IMPRESSION: The study is positive for acute left pulmonary thromboembolism as described. Critical Value/emergent results were called by telephone at the time of interpretation on 02/14/2017 at 10:50 am to Dr. Rolland Porter , who verbally acknowledged these results. Electronically Signed   By: Jolaine Click M.D.   On: 02/14/2017 10:51    Procedures Procedures (including critical care time)  Medications Ordered in ED Medications  morphine 4 MG/ML injection 4 mg (4 mg Intravenous Given 02/14/17 0940)  heparin bolus via infusion 5,000 Units (not administered)  heparin ADULT infusion 100 units/mL (25000 units/259mL sodium chloride 0.45%) (not administered)  ondansetron (ZOFRAN) injection 4 mg (4 mg Intravenous Given 02/14/17 0718)  iopamidol (ISOVUE-370) 76 % injection (100 mLs  Contrast Given 02/14/17 1029)     Initial Impression / Assessment and Plan / ED Course  I have reviewed the triage vital signs and the nursing notes.  Pertinent  labs & imaging results that were available during my care of the patient were reviewed by  me and considered in my medical decision making (see chart for details).   arm and chest pain in a patient with known heart disease but recent cath showing only single vessel disease. Small inpatient stay. Possible pulmonary embolus. Not tachycardic or hypoxemic. This may be simply related the fact he is out of his pain medications. Rule out pneumonia. No signs of shingles on examination. His arm pain was present before and after his surgery and again may be exacerbated by the fact that he has no pain medicine at home  Final Clinical Impressions(s) / ED Diagnoses   Final diagnoses:  Other acute pulmonary embolism without acute cor pulmonale (HCC)    Pt with acute left pulmonary embolus. No sign of acute cor pulmonale. Not tachycardic or hypotensive oxygenating well. Given IV heparin bolus and infusion. We'll discussed with hospitalist regarding admission.  CRITICAL CARE Performed by: Rolland Porter JOSEPH   Total critical care time: 30 minutes  Critical care time was exclusive of separately billable procedures and treating other patients.  Critical care was necessary to treat or prevent imminent or life-threatening deterioration.  Critical care was time spent personally by me on the following activities: development of treatment plan with patient and/or surrogate as well as nursing, discussions with consultants, evaluation of patient's response to treatment, examination of patient, obtaining history from patient or surrogate, ordering and performing treatments and interventions, ordering and review of laboratory studies, ordering and review of radiographic studies, pulse oximetry and re-evaluation of patient's condition.   New Prescriptions New Prescriptions   No medications on file     Rolland Porter, MD 02/14/17 1120

## 2017-02-14 NOTE — Progress Notes (Signed)
ANTICOAGULATION CONSULT NOTE - Initial Consult  Pharmacy Consult for Heparin Indication: pulmonary embolus  Allergies  Allergen Reactions  . Shellfish Allergy Anaphylaxis    Patient Measurements: Height: 5\' 11"  (180.3 cm) Weight: 193 lb (87.5 kg) IBW/kg (Calculated) : 75.3 Heparin Dosing Weight: 87.5 kg  Vital Signs: BP: 134/87 (04/26 0915) Pulse Rate: 80 (04/26 0915)  Labs:  Recent Labs  02/11/17 1226 02/14/17 0624  HGB  --  10.3*  HCT  --  32.7*  PLT  --  304  LABPROT  --  14.8  INR  --  1.16  CREATININE 1.04 1.08    Estimated Creatinine Clearance: 75.5 mL/min (by C-G formula based on SCr of 1.08 mg/dL).   Medical History: Past Medical History:  Diagnosis Date  . Anginal pain (HCC)   . Arthritis   . Blood in stool   . CAD (coronary artery disease)    a. 2011: cath showing mod non-obstructive disease b. 02/2016: cath with 95% stenosis in the Mid RCA. DES placed.  . Cervical stenosis of spine   . Cocaine abuse    history  . Depression   . Diabetes mellitus without complication (HCC)   . Gout, unspecified   . Headache   . HTN (hypertension)   . Hypercholesterolemia   . Hyperglycemia   . Migraines   . Myocardial infarction [facility]) 2008   drug cocaine and marijuana at time of mi none  since    . Pneumonia   . Renal insufficiency   . Stroke [facility])     Medications:   (Not in a hospital admission) Scheduled:   Infusions:    Assessment: 61yo male presents with left arm and chest pain. Patient recently underwent multilevel laminectomy fusion 3 weeks ago. Pharmacy is consulted to dose heparin for PE.   Goal of Therapy:  Heparin level 0.3-0.7 units/ml Monitor platelets by anticoagulation protocol: Yes   Plan:  Give 5000 units bolus x 1 Start heparin infusion at 1500 units/hr Check anti-Xa level in 6 hours and daily while on heparin Continue to monitor H&H and platelets  Arlean Hopping. Newman Pies, PharmD, BCPS Clinical Pharmacist [phone] 02/14/2017,11:04  AM

## 2017-02-14 NOTE — ED Notes (Signed)
Pt aware of need for urine  

## 2017-02-14 NOTE — ED Notes (Signed)
Attempted Report 

## 2017-02-14 NOTE — Progress Notes (Deleted)
Patient ID: Russell Carter                 DOB: [birth year]                      MRN: [id number]     HPI: Russell Carter is a 61 y.o. male patient of Dr Eden Emms referred by Norma Fredrickson, NP to HTN clinic who presents today for follow up. PMH is significant for non-obstructive CAD by cath in 2011, severe single vessel CAD with 95% RCA in 02/2016 s/p DES, prior TIA, MI in 2008, DM, HTN, and HLD. Patient is on Medicaid and has had issues affording his medications in the past. BP  improved to 130/84 but he had been taking both losartan and lisinopril together for the previous 2 days. He reported fatigue, but denied dizziness, blurred vision, falls, or headache. At that time, lisinopril was discontinued and amlodipine was increased to 10 mg. Additionally, it was noted that patient's LDL was elevated above goal <70, so pravastatin 40 mg was discontinued and rosuvastatin 40mg  was started given hx of CAD, MI, and TIA. At most recent visit 1 month ago, amlodipine was decreased to 5mg  daily and BP medications were spaced out d/t symptomatic fatigue and low BP of 105/55. Pt presents today for follow up.   Pt has surgery for cervical stenosis that's causing numbness in his arms and legs scheduled for 01/24/2017. He cannot proceed with this surgery if his BP is uncontrolled. 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 He says he drinks water throughout the day and stays hydrated. He believes he has been more tired in the past 1-2 weeks. He has been watching his salt intake as well. He does take all 3 of his BP medications at the same time in the morning.  Current HTN meds: amlodipine 10mg  daily, HCTZ 25mg  daily, losartan 100 mg daily BP goal: <130/17mmHg  Family History: The patient's family history includes Heart attack in his brother, father, and mother.  Social History: The patient reports that he has never smoked. He has never used smokeless  tobacco. He reports that he drinks about 13.8 oz of alcohol per week . He reports that he uses drugs, including crack cocaine, Marijuana, and Cocaine.  Diet: Grits, eggs and coffee for breakfast. Tea or soda with sandwich for lunch. Has been cutting back on carbs since his TG were high. Meat and salad for dinner - usually has grilled salad. Soda with dinner. Is trying to drink more water. Does not cook with salt, his relatives have high BP so none of them cook with salt either.  Exercise: Walks 3x per day 30-60 minutes.   Home BP readings: Does not have a cuff at home  Labs:  12/27/2016: TC 191, TG 285, HDL 42, LDL 92 (on pravastatin 40 mg daily)  Wt Readings from Last 3 Encounters:  02/14/17 193 lb (87.5 kg)  02/06/17 192 lb 0.3 oz (87.1 kg)  01/24/17 197 lb 15.6 oz (89.8 kg)   BP Readings from Last 3 Encounters:  02/12/17 124/77  01/28/17 135/90  01/21/17 (!) 141/80   Pulse Readings from Last 3 Encounters:  02/12/17 77  01/28/17 88  01/21/17 72    Renal function: Estimated Creatinine Clearance: 75.5 mL/min (by C-G formula based on SCr of 1.08 mg/dL).  Past Medical History:  Diagnosis Date  . Anginal pain (HCC)   . Arthritis   .  Blood in stool   . CAD (coronary artery disease)    a. 2011: cath showing mod non-obstructive disease b. 02/2016: cath with 95% stenosis in the Mid RCA. DES placed.  . Cervical stenosis of spine   . Cocaine abuse    history  . Depression   . Diabetes mellitus without complication (HCC)   . Gout, unspecified   . Headache   . HTN (hypertension)   . Hypercholesterolemia   . Hyperglycemia   . Migraines   . Myocardial infarction [facility]) 2008   drug cocaine and marijuana at time of mi none  since    . Pneumonia   . Renal insufficiency   . Stroke [facility])     No current facility-administered medications on file prior to visit.    Current Outpatient Prescriptions on File Prior to Visit  Medication Sig Dispense Refill  . allopurinol (ZYLOPRIM)  300 MG tablet Take 1 tablet (300 mg total) by mouth daily. 30 tablet 6  . amLODipine (NORVASC) 5 MG tablet Take 1 tablet (5 mg total) by mouth daily. 30 tablet 11  . aspirin EC 81 MG EC tablet Take 1 tablet (81 mg total) by mouth daily.    . baclofen (LIORESAL) 10 MG tablet Take 1 tablet (10 mg total) by mouth 3 (three) times daily. 90 each 0  . CALCIUM-VITAMIN D PO Take 1 tablet by mouth daily.    . clotrimazole (LOTRIMIN) 1 % cream Apply 1 application topically daily as needed (athlete's foot).    Marland Kitchen docusate sodium (COLACE) 100 MG capsule Take 1 capsule (100 mg total) by mouth 2 (two) times daily. 10 capsule 0  . gabapentin (NEURONTIN) 400 MG capsule Take 2 capsules (800 mg total) by mouth 3 (three) times daily. 90 capsule 1  . hydrochlorothiazide (HYDRODIURIL) 25 MG tablet Take 1 tablet (25 mg total) by mouth daily. 30 tablet 11  . losartan (COZAAR) 100 MG tablet Take 1 tablet (100 mg total) by mouth daily. 90 tablet 3  . methocarbamol (ROBAXIN) 750 MG tablet Take 1 tablet (750 mg total) by mouth every 6 (six) hours as needed for muscle spasms. 90 tablet 0  . Multiple Vitamin (MULTIVITAMIN WITH MINERALS) TABS tablet Take 1 tablet by mouth daily.    . nitroGLYCERIN (NITROSTAT) 0.4 MG SL tablet Place 1 tablet (0.4 mg total) under the tongue every 5 (five) minutes as needed for chest pain. 25 tablet 4  . oxyCODONE (OXY IR/ROXICODONE) 5 MG immediate release tablet Take 1-2 tablets (5-10 mg total) by mouth every 3 (three) hours as needed for breakthrough pain. 40 tablet 0  . oxyCODONE 30 MG 12 hr tablet Take 30 mg by mouth every 12 (twelve) hours. 42 tablet 0  . pantoprazole (PROTONIX) 40 MG tablet Take 1 tablet (40 mg total) by mouth at bedtime. 30 tablet 1  . rosuvastatin (CRESTOR) 40 MG tablet Take 1 tablet (40 mg total) by mouth daily. 30 tablet 11  . ticagrelor (BRILINTA) 90 MG TABS tablet Take 1 tablet (90 mg total) by mouth 2 (two) times daily. 60 tablet 11  . [DISCONTINUED] famotidine  (PEPCID) 20 MG tablet Take 1 tablet (20 mg total) by mouth 2 (two) times daily. 30 tablet 0  . [DISCONTINUED] TACRINE HYDROCHLORIDE PO Take 25 mg by mouth daily.        Allergies  Allergen Reactions  . Shellfish Allergy Anaphylaxis     Assessment/Plan:

## 2017-02-14 NOTE — ED Notes (Signed)
Pt unable to void at this time. Urinal at bedside, informed pt of need for a urine sample.

## 2017-02-14 NOTE — Progress Notes (Signed)
Social Work  Discharge Note  The overall goal for the admission was met for:   Discharge location: Yes - home  Length of Stay: Yes - 15 days  Discharge activity level: Yes - modified independent  Home/community participation: Yes  Services provided included: MD, RD, PT, OT, RN, Pharmacy, Neuropsych and SW  Financial Services: Medicaid  Follow-up services arranged: DME: single point cane from [place] and Patient/Family has no preference for HH/DME agencies  Comments (or additional information): [name] was going to come for family education, but did not make it in.  Son was present at d/c and PT went over neck brace information with him.  Pt will have his mother and another brother who have stated they will assist pt, as needed.  Pt could not have f/u therapies due to not having a qualifying diagnosis for Medicaid to cover therapy.  Patient/Family verbalized understanding of follow-up arrangements: Yes  Individual responsible for coordination of the follow-up plan: pt with support from family, if needed  Confirmed correct DME delivered: Trey Sailors 02/14/2017    Dre Gamino, Silvestre Mesi

## 2017-02-15 ENCOUNTER — Encounter (HOSPITAL_COMMUNITY): Payer: Medicaid Other

## 2017-02-15 DIAGNOSIS — Z7982 Long term (current) use of aspirin: Secondary | ICD-10-CM

## 2017-02-15 DIAGNOSIS — Z79891 Long term (current) use of opiate analgesic: Secondary | ICD-10-CM | POA: Diagnosis not present

## 2017-02-15 DIAGNOSIS — I10 Essential (primary) hypertension: Secondary | ICD-10-CM

## 2017-02-15 DIAGNOSIS — I808 Phlebitis and thrombophlebitis of other sites: Secondary | ICD-10-CM | POA: Diagnosis present

## 2017-02-15 DIAGNOSIS — Z8673 Personal history of transient ischemic attack (TIA), and cerebral infarction without residual deficits: Secondary | ICD-10-CM | POA: Diagnosis not present

## 2017-02-15 DIAGNOSIS — I82442 Acute embolism and thrombosis of left tibial vein: Secondary | ICD-10-CM | POA: Diagnosis present

## 2017-02-15 DIAGNOSIS — I2782 Chronic pulmonary embolism: Secondary | ICD-10-CM | POA: Diagnosis not present

## 2017-02-15 DIAGNOSIS — I2699 Other pulmonary embolism without acute cor pulmonale: Secondary | ICD-10-CM | POA: Diagnosis present

## 2017-02-15 DIAGNOSIS — F1411 Cocaine abuse, in remission: Secondary | ICD-10-CM | POA: Diagnosis present

## 2017-02-15 DIAGNOSIS — Z981 Arthrodesis status: Secondary | ICD-10-CM | POA: Diagnosis not present

## 2017-02-15 DIAGNOSIS — Z86711 Personal history of pulmonary embolism: Secondary | ICD-10-CM | POA: Diagnosis not present

## 2017-02-15 DIAGNOSIS — M5412 Radiculopathy, cervical region: Secondary | ICD-10-CM | POA: Diagnosis present

## 2017-02-15 DIAGNOSIS — T81718A Complication of other artery following a procedure, not elsewhere classified, initial encounter: Secondary | ICD-10-CM | POA: Diagnosis present

## 2017-02-15 DIAGNOSIS — E119 Type 2 diabetes mellitus without complications: Secondary | ICD-10-CM | POA: Diagnosis present

## 2017-02-15 DIAGNOSIS — Z79899 Other long term (current) drug therapy: Secondary | ICD-10-CM

## 2017-02-15 DIAGNOSIS — Z955 Presence of coronary angioplasty implant and graft: Secondary | ICD-10-CM

## 2017-02-15 DIAGNOSIS — Z91013 Allergy to seafood: Secondary | ICD-10-CM | POA: Diagnosis not present

## 2017-02-15 DIAGNOSIS — Y838 Other surgical procedures as the cause of abnormal reaction of the patient, or of later complication, without mention of misadventure at the time of the procedure: Secondary | ICD-10-CM | POA: Diagnosis not present

## 2017-02-15 DIAGNOSIS — M79602 Pain in left arm: Secondary | ICD-10-CM

## 2017-02-15 DIAGNOSIS — Z7902 Long term (current) use of antithrombotics/antiplatelets: Secondary | ICD-10-CM | POA: Diagnosis not present

## 2017-02-15 DIAGNOSIS — I2510 Atherosclerotic heart disease of native coronary artery without angina pectoris: Secondary | ICD-10-CM | POA: Diagnosis present

## 2017-02-15 DIAGNOSIS — I119 Hypertensive heart disease without heart failure: Secondary | ICD-10-CM | POA: Diagnosis present

## 2017-02-15 DIAGNOSIS — M109 Gout, unspecified: Secondary | ICD-10-CM | POA: Diagnosis present

## 2017-02-15 DIAGNOSIS — E785 Hyperlipidemia, unspecified: Secondary | ICD-10-CM | POA: Diagnosis present

## 2017-02-15 LAB — CBC
HCT: 33.5 % — ABNORMAL LOW (ref 39.0–52.0)
HEMOGLOBIN: 10.8 g/dL — AB (ref 13.0–17.0)
MCH: 29.1 pg (ref 26.0–34.0)
MCHC: 32.2 g/dL (ref 30.0–36.0)
MCV: 90.3 fL (ref 78.0–100.0)
Platelets: 299 10*3/uL (ref 150–400)
RBC: 3.71 MIL/uL — ABNORMAL LOW (ref 4.22–5.81)
RDW: 15.1 % (ref 11.5–15.5)
WBC: 6.3 10*3/uL (ref 4.0–10.5)

## 2017-02-15 LAB — COMPREHENSIVE METABOLIC PANEL
ALBUMIN: 2.8 g/dL — AB (ref 3.5–5.0)
ALT: 17 U/L (ref 17–63)
AST: 16 U/L (ref 15–41)
Alkaline Phosphatase: 48 U/L (ref 38–126)
Anion gap: 7 (ref 5–15)
BUN: 11 mg/dL (ref 6–20)
CHLORIDE: 106 mmol/L (ref 101–111)
CO2: 25 mmol/L (ref 22–32)
Calcium: 8.9 mg/dL (ref 8.9–10.3)
Creatinine, Ser: 1.28 mg/dL — ABNORMAL HIGH (ref 0.61–1.24)
GFR calc Af Amer: >60 mL/min (ref >60)
GFR, EST NON AFRICAN AMERICAN: 58 mL/min — AB (ref >60)
Glucose, Bld: 126 mg/dL — ABNORMAL HIGH (ref 65–99)
POTASSIUM: 3.5 mmol/L (ref 3.5–5.1)
SODIUM: 138 mmol/L (ref 135–145)
Total Bilirubin: 0.5 mg/dL (ref 0.3–1.2)
Total Protein: 6.4 g/dL — ABNORMAL LOW (ref 6.5–8.1)

## 2017-02-15 LAB — GLUCOSE, CAPILLARY
GLUCOSE-CAPILLARY: 110 mg/dL — AB (ref 65–99)
GLUCOSE-CAPILLARY: 136 mg/dL — AB (ref 65–99)
GLUCOSE-CAPILLARY: 167 mg/dL — AB (ref 65–99)
Glucose-Capillary: 130 mg/dL — ABNORMAL HIGH (ref 65–99)

## 2017-02-15 LAB — HIV ANTIBODY (ROUTINE TESTING W REFLEX): HIV Screen 4th Generation wRfx: NONREACTIVE

## 2017-02-15 LAB — HEPARIN LEVEL (UNFRACTIONATED): Heparin Unfractionated: 0.45 IU/mL (ref 0.30–0.70)

## 2017-02-15 MED ORDER — CLOPIDOGREL BISULFATE 75 MG PO TABS
75.0000 mg | ORAL_TABLET | Freq: Every day | ORAL | Status: DC
  Administered 2017-02-15 – 2017-02-17 (×3): 75 mg via ORAL
  Filled 2017-02-15 (×3): qty 1

## 2017-02-15 MED ORDER — MORPHINE SULFATE (PF) 4 MG/ML IV SOLN
1.0000 mg | Freq: Once | INTRAVENOUS | Status: AC | PRN
  Administered 2017-02-16: 1 mg via INTRAVENOUS
  Filled 2017-02-15: qty 1

## 2017-02-15 MED ORDER — DICLOFENAC SODIUM 1 % TD GEL
2.0000 g | Freq: Four times a day (QID) | TRANSDERMAL | Status: DC
  Administered 2017-02-15 – 2017-02-17 (×9): 2 g via TOPICAL
  Filled 2017-02-15: qty 100

## 2017-02-15 NOTE — Progress Notes (Signed)
ANTICOAGULATION CONSULT NOTE - Follow Up Consult  Pharmacy Consult for Heparin Indication: pulmonary embolus  Allergies  Allergen Reactions  . Shellfish Allergy Anaphylaxis    Patient Measurements: Height: 5\' 11"  (180.3 cm) Weight: 175 lb 7.8 oz (79.6 kg) IBW/kg (Calculated) : 75.3 Heparin Dosing Weight: 79.6 kg  Vital Signs: Temp: 98.4 F (36.9 C) (04/27 0500) Temp Source: Oral (04/27 0500) BP: 113/68 (04/27 0500) Pulse Rate: 77 (04/27 0500)  Labs:  Recent Labs  02/14/17 0624 02/14/17 1817 02/15/17 0250  HGB 10.3*  --  10.8*  HCT 32.7*  --  33.5*  PLT 304  --  299  LABPROT 14.8  --   --   INR 1.16  --   --   HEPARINUNFRC  --  0.60 0.45  CREATININE 1.08  --  1.28*    Estimated Creatinine Clearance: 63.7 mL/min (A) (by C-G formula based on SCr of 1.28 mg/dL (H)).  Assessment:   61 yr old male with pulmonary embolus per CTA on 02/14/17. Hx cervical surgery 01/24/17.   Heparin level remains therapeutic (0.45) on 1500 units/hr.   Noted tender left arm and hand, for UE venous duplex.  Goal of Therapy:  Heparin level 0.3-0.7 units/ml Monitor platelets by anticoagulation protocol: Yes   Plan:   Continue heparin drip at 1500 units/hr.  Daily heparin level and CBC.  Follow up UE duplex.  Follow up plans for oral anticoagulation.  Dennie Fetters, RPh Pager: [phone] 02/15/2017,10:39 AM

## 2017-02-15 NOTE — Consult Note (Signed)
Cardiology Consult    Patient ID: Russell Carter MRN: [id number], DOB/AGE: [birth year]   Admit date: 02/14/2017 Date of Consult: 02/15/2017  Primary Physician: No PCP Per Patient Primary Cardiologist: Eden Emms Requesting Provider: Mikey Bussing Reason for Consultation: Anticoagulation  [name] is a 61 y.o. male who is being seen today for the evaluation of anticoagulation at the request of Dr. Mikey Bussing.  Patient Profile    61 yo male with PMH of CAD s/p DES to RCA (5/17), HTN, HL and prior TIA who presented with left arm pain and pleuritic chest pai. Found to have PE on CTA.   Past Medical History   Past Medical History:  Diagnosis Date  . Anginal pain (HCC)   . Arthritis   . Blood in stool   . CAD (coronary artery disease)    a. 2011: cath showing mod non-obstructive disease b. 02/2016: cath with 95% stenosis in the Mid RCA. DES placed.  . Cervical stenosis of spine   . Cocaine abuse    history  . Depression   . Diabetes mellitus without complication (HCC)   . Gout, unspecified   . Headache   . HTN (hypertension)   . Hypercholesterolemia   . Hyperglycemia   . Migraines   . Myocardial infarction [facility]) 2008   drug cocaine and marijuana at time of mi none  since    . Pneumonia   . Renal insufficiency   . Stroke [facility])     Past Surgical History:  Procedure Laterality Date  . ANTERIOR CERVICAL DECOMP/DISCECTOMY FUSION N/A 01/24/2017   Procedure: Cervical Four-Five,Cervical Five Six,Cervical Six-Seven Anterior cervical discectomy with fusion and plate with Cervical Three-Thoracic- One Laminectomy/Foraminotomy with Cervical Three-Thoracic- One Fixation and fusion;  Surgeon: Loura Halt Ditty, MD;  Location: [facility] OR;  Service: Neurosurgery;  Laterality: N/A;  Part-1 anterior approach  . CARDIAC CATHETERIZATION  2008; ~ 2011   [place]-40%lad  . CARDIAC CATHETERIZATION N/A 03/16/2016   Procedure: Left Heart Cath and Coronary Angiography;  Surgeon: Marykay Lex, MD;  Location: [facility]  INVASIVE CV LAB;  Service: Cardiovascular;  Laterality: N/A;  . CORONARY ANGIOPLASTY     STENTS  . EYE SURGERY Bilateral    "for double vision"  . INGUINAL HERNIA REPAIR  05/12/2012   Procedure: HERNIA REPAIR INGUINAL ADULT;  Surgeon: Liz Malady, MD;  Location: [facility];  Service: General;  Laterality: Right;  . INGUINAL HERNIA REPAIR Left 02/02/2003   Dr. Violeta Gelinas. repair with mesh. Same procedure done again in  05/22/2004   . INGUINAL HERNIA REPAIR Left ?date 2nd OR  . POSTERIOR CERVICAL FUSION/FORAMINOTOMY N/A 01/24/2017   Procedure: Cervical Three-Thoracic One-Laminectomy/Foraminotomy with Cervical Three-Thoracic One Fixation and fusion;  Surgeon: Loura Halt Ditty, MD;  Location: [facility] OR;  Service: Neurosurgery;  Laterality: N/A;  Part-2 Posterior approach  . RECONSTRUCT / STABILIZE DISTAL ULNA    . SHOULDER OPEN ROTATOR CUFF REPAIR Left 2010  . SHOULDER SURGERY Left    "injured on the job; had to cut small bone out"  . THUMB FUSION Right      Allergies  Allergies  Allergen Reactions  . Shellfish Allergy Anaphylaxis    History of Present Illness    Mr. Carter is a 61 yo male with PMH of CAD s/p DES to RCA (5/17), HTN, HL and prior TIA. He presented to the ED on 03/13/16 with chest pain. Found to have CHB and hypotension. Cardiac enzymes were negative, but given his symptomatic CHB he underwent cardiac catheterization showing  95% RCA that was treated with Promus DES stent 2.5 mm x 20 mm (2.8 mm) with 0% residual stenosis. LV function was normal. Placed on DAPT with ASA and Brilinta. MRI had also been performed due to his numbness in his arms and legs and pain with movement, which showed significant cervical stenosis of C11-T1.  Neurosurgery was consulted and recommended follow-up in clinic after cardiac clearance was obtained for likely C3-T1 laminectomy and fusion.    He was seen by Dr. Eden Emms in 8/17, reported doing well. Given clearance to stop Brilinta for  5-7 preoperatively if needed.   Last seen in the office 3/18 by Lawson Fiscal. Had run out of medications, blood pressure elevated. Given clearance to hold Brilinta for neurosurgery. Medications refilled.   He presented on 02/14/17 with left arm, hand and chest pain. S/p multilevel laminectomy for bilateral radiculopathy about 3 weeks prior. In the ED found to have a PE on CTA. Started on IV heparin. EKG showed SR without acute changes. Trop negative. Upper/lower extremity dopplers ordered, but pending. Cardiology has been asked to assist with [facility] as he is currently on ASA and Brilinta.   Inpatient Medications    . allopurinol  300 mg Oral Daily  . amLODipine  5 mg Oral QPM  . aspirin EC  81 mg Oral Daily  . baclofen  10 mg Oral TID  . diclofenac sodium  2 g Topical QID  . gabapentin  800 mg Oral TID  . hydrochlorothiazide  25 mg Oral Daily  . insulin aspart  0-9 Units Subcutaneous TID WC  . losartan  100 mg Oral Daily  . multivitamin with minerals  1 tablet Oral Daily  . rosuvastatin  40 mg Oral Daily  . senna-docusate  1 tablet Oral QHS  . ticagrelor  90 mg Oral BID    Family History    Family History  Problem Relation Age of Onset  . Heart attack Mother     x7. Still alive  . Heart attack Father     deceased b/c of MI  . Heart attack Brother     older, deceased b/c of MI     Social History    Social History   Social History  . Marital status: Widowed    Spouse name: N/A  . Number of children: N/A  . Years of education: N/A   Occupational History  . Not on file.   Social History Main Topics  . Smoking status: Never Smoker  . Smokeless tobacco: Never Used  . Alcohol use 13.8 oz/week    23 Cans of beer per week     Comment: 03/14/2016 "40oz beer/day"  . Drug use: Yes    Types: "Crack" cocaine, Marijuana, Cocaine     Comment: 03/14/2016 "quit 2008"  . Sexual activity: Not Currently   Other Topics Concern  . Not on file   Social History Narrative   Lives in [place]  with his wife and son.      Review of Systems    General:  No chills, fever, night sweats or weight changes.  Cardiovascular:  See HPI Dermatological: No rash, lesions/masses Respiratory: No cough, dyspnea Urologic: No hematuria, dysuria Abdominal:   No nausea, vomiting, diarrhea, bright red blood per rectum, melena, or hematemesis Neurologic:  No visual changes, wkns, changes in mental status. All other systems reviewed and are otherwise negative except as noted above.  Physical Exam    Blood pressure 103/63, pulse 95, temperature 98.2 F (36.8 C), temperature source  Oral, resp. rate 18, height 5\' 11"  (1.803 m), weight 175 lb 7.8 oz (79.6 kg), SpO2 93 %.  General: Pleasant older AAM, NAD. In Aspen collar. Psych: Normal affect. Neuro: Alert and oriented X 3. Moves all extremities spontaneously. HEENT: Normal  Neck: Supple without bruits or JVD. Lungs:  Resp regular and unlabored, CTA. Heart: RRR no s3, s4, or murmurs. Abdomen: Soft, non-tender, non-distended, BS + x 4.  Extremities: No clubbing, cyanosis, swelling to LU/LL extremity. DP/PT/Radials 2+ and equal bilaterally.  Labs    Troponin [facility] Test)  Recent Labs  02/14/17 0953  TROPIPOC 0.00   No results for input(s): CKTOTAL, CKMB, TROPONINI in the last 72 hours. Lab Results  Component Value Date   WBC 6.3 02/15/2017   HGB 10.8 (L) 02/15/2017   HCT 33.5 (L) 02/15/2017   MCV 90.3 02/15/2017   PLT 299 02/15/2017    Recent Labs Lab 02/15/17 0250  NA 138  K 3.5  CL 106  CO2 25  BUN 11  CREATININE 1.28*  CALCIUM 8.9  PROT 6.4*  BILITOT 0.5  ALKPHOS 48  ALT 17  AST 16  GLUCOSE 126*   Lab Results  Component Value Date   CHOL 191 12/26/2016   HDL 42 12/26/2016   LDLCALC 92 12/26/2016   TRIG 285 (H) 12/26/2016   Lab Results  Component Value Date   DDIMER 0.32 01/14/2013     Radiology Studies    Dg Chest 2 View  Result Date: 02/14/2017 CLINICAL DATA:  61 year old male with chest pain  and shortness of breath. EXAM: CHEST  2 VIEW COMPARISON:  Chest radiograph dated 03/13/2016 FINDINGS: There is shallow inspiration. Left lung base linear and platelike atelectasis. There is no focal consolidation, pleural effusion, or pneumothorax. The cardiac silhouette is within normal limits. There has been interval placement of cervical spine fusion hardware. No acute osseous pathology. IMPRESSION: Shallow inspiration with bibasilar atelectatic changes. No focal consolidation. Electronically Signed   By: Elgie Collard M.D.   On: 02/14/2017 06:48   Dg Cervical Spine 2-3 Views  Result Date: 01/24/2017 CLINICAL DATA:  A CT calf and posterior fusion EXAM: CERVICAL SPINE - 2-3 VIEW COMPARISON:  01/24/2017, MRI 03/13/2016 FINDINGS: Three lateral views of the cervical spine. Staples over the occipital and suboccipital region. Surgical devices posterior to the cervical spine. Tips of the endotracheal and esophageal tubes are not delineated on the limited views provided. Partially visualized anterior surgical plate, and screw fixation with interbody device at C4, C5, and C6 ; below C6 is not well evaluated. IMPRESSION: Anterior surgical plate and screw fixation from C4 through C7 with limited visualization below C6. Electronically Signed   By: Jasmine Pang M.D.   On: 01/24/2017 16:04   Dg Cervical Spine 2-3 Views  Result Date: 01/24/2017 CLINICAL DATA:  Cervical fusion. EXAM: DG C-ARM 61-120 MIN; CERVICAL SPINE - 2-3 VIEW COMPARISON:  Cervical spine MRI 03/13/2016 FLUOROSCOPY TIME:  C-arm fluoroscopic images were obtained intraoperatively and submitted for post operative interpretation. Please see the performing provider's procedural report for the fluoroscopy time utilized. FINDINGS: Two lateral intraoperative spot fluoroscopic images of the cervical spine are provided. These demonstrate the interval performance of C4-C7 ACDF with placement of an anterior fusion plate, screws, and interbody implants.  Endotracheal tube, enteric tube, and surgical sponges are partially visualized. IMPRESSION: Intraoperative images during C4-C7 ACDF. Electronically Signed   By: Sebastian Ache M.D.   On: 01/24/2017 13:39   Ct Angio Chest Pe W Or Wo  Contrast  Result Date: 02/14/2017 CLINICAL DATA:  Left chest pain. Left shoulder pain. Symptoms since last night. Recent cervical spine surgery, April 5th. EXAM: CT ANGIOGRAPHY CHEST WITH CONTRAST TECHNIQUE: Multidetector CT imaging of the chest was performed using the standard protocol during bolus administration of intravenous contrast. Multiplanar CT image reconstructions and MIPs were obtained to evaluate the vascular anatomy. CONTRAST:  100 cc Isovue 370 COMPARISON:  None. FINDINGS: Cardiovascular: The study is positive for acute pulmonary thromboembolism. There is linear low-density filling defect extending from the main left pulmonary artery into left upper and lower lobar branches. There is no evidence of right heart strain. No evidence of aortic aneurysm or dissection. Mediastinum/Nodes: No abnormal mediastinal adenopathy. Visualized thyroid is normal. No pericardial effusion. Lungs/Pleura: Tiny left pleural effusion. Dependent atelectasis towards the lung bases. There is more confluent pulmonary opacity at the left lung base over the left hemidiaphragm. Again, volume loss is favored over pulmonary infarct. Upper Abdomen: No acute abnormality. Musculoskeletal: Fusion hardware is in place extending from the cervical spine to T1. There is no vertebral compression deformity Review of the MIP images confirms the above findings. IMPRESSION: The study is positive for acute left pulmonary thromboembolism as described. Critical Value/emergent results were called by telephone at the time of interpretation on 02/14/2017 at 10:50 am to Dr. Rolland Porter , who verbally acknowledged these results. Electronically Signed   By: Jolaine Click M.D.   On: 02/14/2017 10:51   Mr Cervical Spine Wo  Contrast  Result Date: 01/27/2017 CLINICAL DATA:  Initial evaluation for cervical spondylosis with radiculopathy. Status post decompression with fusion. EXAM: MRI CERVICAL SPINE WITHOUT CONTRAST TECHNIQUE: Multiplanar, multisequence MR imaging of the cervical spine was performed. No intravenous contrast was administered. COMPARISON:  Prior MRI from 03/13/2016. FINDINGS: Alignment: Straightening of the normal cervical lordosis. No listhesis. Vertebrae: Vertebral body heights maintained. Signal intensity within the vertebral body bone marrow is normal. Postoperative changes from recent spinal fusion with decompression are evident. Patient has undergone ACDF at through C7. Posterior decompression with laminectomies have been performed at C3 through T1. Posterior fusion in place at C3 through T1 as well. Hardware appears well positioned. Cord: Signal intensity within the cervical spinal cord is normal. Posterior Fossa, vertebral arteries, paraspinal tissues: Visualized brain and posterior fossa within normal limits. Craniocervical junction normal. Large T2 hyperintense collection at the posterior surgical measures 14.9 x 5.1 x 2.3 cm in greatest transaxial dimensions. Additional fluid partially visualized subjacent to the inferior aspect of the left trapezius muscle. Finding most likely reflects a postoperative seroma. While possible CSF leak not entirely excluded, no sagging of the brain at the level of the foramen magnum seen to suggest intracranial hypotension. Extensive prevertebral edema not with fluid with present as well related to anterior cervical ACDF. Probable surgical drain in place. Normal intravascular flow voids present within the vertebral arteries bilaterally. Disc levels: C2-C3: Mild diffuse chronic degenerative disc osteophyte complex mildly indents the ventral thecal sac. Mild facet arthrosis. No significant canal or foraminal stenosis. C3-C4: Patient is status post posterior decompression. Stable  mild diffuse disc bulge. Moderate to severe canal stenosis related to the postoperative collection. Mild to moderate residual foraminal narrowing, slightly worse on the left, which appears improved from previous. C4-C5: Status post ACDF and posterior decompression. Moderate to severe canal stenosis related to the postoperative collection. No appreciable residual foraminal stenosis. C5-C6: Status post ACDF and posterior decompression. Moderate to severe canal stenosis related to the posterior collection. No appreciable residual foraminal narrowing. C6-C7:  Status post ACDF and posterior decompression. Moderate to severe canal stenosis related to the posterior collection. No appreciable residual foraminal stenosis. C7-T1: Status post posterior decompression with fusion. Stable degenerative disc bulge. No significant canal stenosis. Probable improved bilateral foraminal narrowing. Visualized upper thoracic spine within normal limits. IMPRESSION: 1. Postoperative changes from recent ACDF at C4 through C7, with posterior decompression and fusion at C3 through T1. 2. 14.9 x 5.1 x 2.3 cm collection at the posterior surgical site, most likely a postoperative seroma. While CSF leak is not entirely excluded, no sagging of the brain at the craniocervical junction to suggest CNS hypotension identified. As always, superimposed infection could be considered in the correct clinical setting. 3. Moderate to severe diffuse canal stenosis at C3-4 through C7-T1, largely due to the postoperative collection. Foraminal narrowing at these levels appears improved relative to preoperative exam from 03/13/2016. Electronically Signed   By: Rise Mu M.D.   On: 01/27/2017 22:47   Dg C-arm 1-60 Min  Result Date: 01/24/2017 CLINICAL DATA:  Cervical fusion. EXAM: DG C-ARM 61-120 MIN; CERVICAL SPINE - 2-3 VIEW COMPARISON:  Cervical spine MRI 03/13/2016 FLUOROSCOPY TIME:  C-arm fluoroscopic images were obtained intraoperatively and  submitted for post operative interpretation. Please see the performing provider's procedural report for the fluoroscopy time utilized. FINDINGS: Two lateral intraoperative spot fluoroscopic images of the cervical spine are provided. These demonstrate the interval performance of C4-C7 ACDF with placement of an anterior fusion plate, screws, and interbody implants. Endotracheal tube, enteric tube, and surgical sponges are partially visualized. IMPRESSION: Intraoperative images during C4-C7 ACDF. Electronically Signed   By: Sebastian Ache M.D.   On: 01/24/2017 13:39    ECG & Cardiac Imaging    EKG: SR without acute changes  Echo: 3/14  Study Conclusions  - Left ventricle: The cavity size was normal. Wall thickness was increased in a pattern of mild LVH. There was mild focal basal hypertrophy of the septum. Systolic function was normal. The estimated ejection fraction was in the range of 60% to 65%. Wall motion was normal; there were no regional wall motion abnormalities. - Atrial septum: No defect or patent foramen ovale was identified.  Assessment & Plan    61 yo male with PMH of CAD s/p DES to RCA (5/17), HTN, HL and prior TIA who presented with left arm pain and pleuritic chest pain. Found to have PE on CTA.   1. Acute PE: Noted on CTA this admission. Recent multilevel laminectomy for bilateral radiculopathy about 3 weeks ago. C/o left arm pain, and left leg swelling. Dopplers pending.  -- currently on IV heparin. Plans for OAC. Xarelto?  2. CAD s/p RCA stent 5/17: Has been on ASA and Brilinta. States he has been compliant with medications. He is close to completing his year of DAPT.  -- given increased bleeding risk with Brilinta and OAC, would suggest changing over to plavix.  3. HTN: Controlled  4. HL: on statin  5. Cervical radiculopathy: s/p recent surgery. Could be exacerbation, but UE doppler pending to r/o DVT   Signed, Laverda Page, NP-C Pager  [phone] 02/15/2017, 1:57 PM Patient seen and examined and history reviewed. Agree with above findings and plan. Mr. Mille is admitted with acute PE following recent surgery for cervical radiculopathy. He is s/p stenting of the RCA on Mar 16, 2016 for USAP. No cardiac problems since. No bleeding issues on ASA and Brilinta.  On exam he is in a cervical collar. Unable to assess JVD. Lungs are clear  anteriorly. CV RRR without gallop or murmur No edema  Ecg shows NSR with normal Ecg. I have personally reviewed and interpreted this study.  Creatinine 1.28. Hgb 10.8  Impression:  1. Acute PE secondary to immobilization with recent cervical surgery 2. CAD s/p stent of RCA 11 months ago with DES 3. HTN controlled. 4. HLD 5. S/p cervical surgery.  Plan: recommend switching Brilinta to Plavix 75 mg daily. Stop ASA given need for anticoagulation. OK to fully anticoagulate with NOAC of your choice (PE dose)  Would check Echo to assess for RV dysfunction with PE. Peter [place], MDFACC 02/15/2017 4:05 PM

## 2017-02-15 NOTE — Progress Notes (Signed)
Subjective: Patient was still complaining of 7/10 left arm pain, and swelling which started overnight. He denies any more chest pain or shortness of breath. He was also complaining of left calf pain which started this morning.  Objective:  Vital signs in last 24 hours: Vitals:   02/14/17 1600 02/14/17 1709 02/14/17 2112 02/15/17 0500  BP: (!) 149/99 (!) 160/94 135/84 113/68  Pulse: 87 76 80 77  Resp: (!) 22 18 18 20   Temp:  98.7 F (37.1 C) 98.5 F (36.9 C) 98.4 F (36.9 C)  TempSrc:  Oral Oral Oral  SpO2: 95% 100% 100% 100%  Weight:  175 lb 7.8 oz (79.6 kg)    Height:  5\' 11"  (1.803 m)     Gen. Well-developed gentleman, wearing a neck collar, in no acute distress. Lungs. Clear bilaterally CV. Regular rate and rhythm. Abdomen. Soft, nontender, bowel sounds positive. Extremities. Left arm and hand tenderness, edema around elbow extending to lower arm, no obvious erythema or hyperthermia skin warm to touch and pulses intact. Mild left calf tenderness is no edema or erythema,  pulses intact and symmetrical bilaterally in lower extremities.  Labs. CBC Latest Ref Rng & Units 02/15/2017 02/14/2017 02/07/2017  WBC 4.0 - 10.5 K/uL 6.3 8.4 9.7  Hemoglobin 13.0 - 17.0 g/dL 10.8(L) 10.3(L) 9.6(L)  Hematocrit 39.0 - 52.0 % 33.5(L) 32.7(L) 29.6(L)  Platelets 150 - 400 K/uL 299 304 284   CMP Latest Ref Rng & Units 02/15/2017 02/14/2017 02/11/2017  Glucose 65 - 99 mg/dL 161(W) 960(A) 85  BUN 6 - 20 mg/dL 11 9 14   Creatinine 0.61 - 1.24 mg/dL 5.40(J) 8.11 9.14  Sodium 135 - 145 mmol/L 138 139 140  Potassium 3.5 - 5.1 mmol/L 3.5 3.3(L) 3.9  Chloride 101 - 111 mmol/L 106 106 107  CO2 22 - 32 mmol/L 25 25 23   Calcium 8.9 - 10.3 mg/dL 8.9 8.9 9.0  Total Protein 6.5 - 8.1 g/dL 6.4(L) - -  Total Bilirubin 0.3 - 1.2 mg/dL 0.5 - -  Alkaline Phos 38 - 126 U/L 48 - -  AST 15 - 41 U/L 16 - -  ALT 17 - 63 U/L 17 - -   Assessment/Plan:  Russell Carter a 61 y.o.man with PMHx significant for  diabetes, hypertension, CAD s/p PCI of RCA in 2017, prior cocaine abuse, cervical stenosis s/p multilevel laminectomy fusion for bilateral C5 radiculopathy 3 weeks ago,Brought to ED with left arm, hand and left sided pleuritic chest pain started around 12:30 PM today, associated with shortness of breath. Found to have PE on CTA.  Pulmonary embolism (HCC).  patient remained stable overnight. Still complaining of left arm pain and tenderness along with new onset swelling.today he was also complaining of left calf pain which was new. Upper extremity ultrasound to rule out any DVT-still pending. We will add lower extremity vascular ultrasound too. Cardiology consult regarding starting him on anticoagulation as he is already on dual antiplatelets. Meanwhile we will continue heparin infusion.  Left arm pain. Can be multifactorial as patient stated that this pain is quite similar to his radiculopathy pain, but he is more tender to touch now and experiencing some new onset swelling. DVT is one of the possibility along with cervical radiculopathy. We will get UE ultrasound. If that is negative we might have to get a neck CT to reevaluate his cervical radiculopathy.  History of CAD. No ongoing chest pain. Troponin and EKG negative. -Continue aspirin and brilinta and we will really appreciate cardiology recommendations  regarding adding anticoagulation to his current regimen.  Hypertension. Currently normotensive. -Continue current home dose of amlodipine 5 mg daily, hydrochlorothiazide 25 mg daily and losartan 100 mg daily. -Monitor blood pressure.  Diabetes. Currently diet control. CBG remained between 110-136. -Continue monitoring CBG. -SSI if needed  Cervical radiculopathy. S/P laminectomy. His left pain may be a result of any exacerbation of his cervical radiculopathy.we will do this CT neck and upper extremity ultrasound was negative for any DVT in that arm. Wound care is following his surgical  wound. Should remain in collar all the time. He should follow-up with his neurosurgery after discharge. Norco for pain control.  Dispo: Anticipated discharge in approximately 1 day(s).   Russell Courser, MD 02/15/2017, 11:36 AM Pager: [phone]

## 2017-02-15 NOTE — Progress Notes (Signed)
OT Cancellation Note  Patient Details Name: Russell Carter MRN: [id number] DOB: 1956/03/27   Cancelled Treatment:    Reason Eval/Treat Not Completed: Medical issues which prohibited therapy (acute PE not yet 24 hours on heparin). Will follow up for OT eval as time allows.  Gaye Alken M.S., OTR/L Pager: [phone]  02/15/2017, 7:45 AM

## 2017-02-15 NOTE — Consult Note (Signed)
WOC Nurse wound consult note Reason for Consult: Consult requested for previous laminectomy surgery sites; to anterior and posterior neck.  Pt is wearing a hard collar and has a protective dressing in place over the posterior wound; anterior site is open to air but pt states this was previously covered also. Wound type: Healing post-op incisions to both locations; well approximated with clear medical adhesive; beginning to lift and peel at the wound edges.  No open wound, drainage or fluctuance. Dressing procedure/placement/frequency: Applied dressing as was previously ordered by the surgeon to anterior and posterior wounds with nonadherent dressing and tegaderm.  Pt states this should reman in place and be changed weekly.  He should resume follow-up with the neurosurgeon after discharge. Discussed plan of care and he verbalized understanding. Please re-consult if further assistance is needed.  Thank-you,  Cammie Mcgee MSN, RN, CWOCN, [place], CNS [phone]

## 2017-02-15 NOTE — Progress Notes (Signed)
PT Cancellation Note  Patient Details Name: Russell Carter MRN: [id number] DOB: Aug 16, 1956   Cancelled Treatment:    Reason Eval/Treat Not Completed: Medical issues which prohibited therapy (acute PE not yet 24 hours on heparin. Will attempt next date)   Cristine Polio 02/15/2017, 7:39 AM  [name], PT [phone]

## 2017-02-16 ENCOUNTER — Inpatient Hospital Stay (HOSPITAL_COMMUNITY): Payer: Medicaid Other

## 2017-02-16 DIAGNOSIS — I2782 Chronic pulmonary embolism: Secondary | ICD-10-CM

## 2017-02-16 DIAGNOSIS — I2699 Other pulmonary embolism without acute cor pulmonale: Secondary | ICD-10-CM

## 2017-02-16 DIAGNOSIS — Z86711 Personal history of pulmonary embolism: Secondary | ICD-10-CM

## 2017-02-16 LAB — CBC
HCT: 30.7 % — ABNORMAL LOW (ref 39.0–52.0)
Hemoglobin: 9.7 g/dL — ABNORMAL LOW (ref 13.0–17.0)
MCH: 28.4 pg (ref 26.0–34.0)
MCHC: 31.6 g/dL (ref 30.0–36.0)
MCV: 89.8 fL (ref 78.0–100.0)
PLATELETS: 288 10*3/uL (ref 150–400)
RBC: 3.42 MIL/uL — ABNORMAL LOW (ref 4.22–5.81)
RDW: 14.9 % (ref 11.5–15.5)
WBC: 5.4 10*3/uL (ref 4.0–10.5)

## 2017-02-16 LAB — ECHOCARDIOGRAM COMPLETE
HEIGHTINCHES: 71 in
WEIGHTICAEL: 2807.78 [oz_av]

## 2017-02-16 LAB — GLUCOSE, CAPILLARY
GLUCOSE-CAPILLARY: 148 mg/dL — AB (ref 65–99)
GLUCOSE-CAPILLARY: 104 mg/dL — AB (ref 65–99)
GLUCOSE-CAPILLARY: 86 mg/dL (ref 65–99)
Glucose-Capillary: 76 mg/dL (ref 65–99)

## 2017-02-16 LAB — HEPARIN LEVEL (UNFRACTIONATED): Heparin Unfractionated: 0.61 IU/mL (ref 0.30–0.70)

## 2017-02-16 NOTE — Progress Notes (Signed)
VASCULAR LAB PRELIMINARY  PRELIMINARY  PRELIMINARY  PRELIMINARY  Left upper extremity venous duplex completed.    Preliminary report:  There is no DVT noted in the visualized veins of the left upper extremity.  There is acute superficial thrombus noted in the left cephalic vein at the [facility].    Garlon Tuggle, RVT 02/16/2017, 4:32 PM

## 2017-02-16 NOTE — Progress Notes (Signed)
Echocardiogram 2D Echocardiogram has been performed.  Russell Carter 02/16/2017, 2:18 PM

## 2017-02-16 NOTE — Progress Notes (Signed)
Subjective:  Denies SSCP, palpitations or Dyspnea Neck pain   Objective:  Vitals:   02/15/17 1336 02/15/17 1947 02/15/17 2127 02/16/17 0329  BP: 103/63 109/68 104/69 121/68  Pulse: 95 71  66  Resp: 18 20  20   Temp: 98.2 F (36.8 C) 97.6 F (36.4 C)  98.1 F (36.7 C)  TempSrc: Oral Oral  Oral  SpO2: 93% 98%  100%  Weight:      Height:        Intake/Output from previous day:  Intake/Output Summary (Last 24 hours) at 02/16/17 1033 Last data filed at 02/16/17 1610  Gross per 24 hour  Intake              480 ml  Output              300 ml  Net              180 ml    Physical Exam: Affect appropriate Black male with neck collar on  HEENT: normal Neck supple with no adenopathy JVP normal no bruits no thyromegaly Lungs clear with no wheezing and good diaphragmatic motion Heart:  S1/S2 no murmur, no rub, gallop or click PMI normal Abdomen: benighn, BS positve, no tenderness, no AAA no bruit.  No HSM or HJR Distal pulses intact with no bruits No edema Neuro non-focal Skin warm and dry No muscular weakness   Lab Results: Basic Metabolic Panel:  Recent Labs  96/04/54 0624 02/15/17 0250  NA 139 138  K 3.3* 3.5  CL 106 106  CO2 25 25  GLUCOSE 118* 126*  BUN 9 11  CREATININE 1.08 1.28*  CALCIUM 8.9 8.9   Liver Function Tests:  Recent Labs  02/15/17 0250  AST 16  ALT 17  ALKPHOS 48  BILITOT 0.5  PROT 6.4*  ALBUMIN 2.8*   CBC:  Recent Labs  02/15/17 0250 02/16/17 0241  WBC 6.3 5.4  HGB 10.8* 9.7*  HCT 33.5* 30.7*  MCV 90.3 89.8  PLT 299 288    Imaging: Ct Angio Chest Pe W Or Wo Contrast  Result Date: 02/14/2017 CLINICAL DATA:  Left chest pain. Left shoulder pain. Symptoms since last night. Recent cervical spine surgery, April 5th. EXAM: CT ANGIOGRAPHY CHEST WITH CONTRAST TECHNIQUE: Multidetector CT imaging of the chest was performed using the standard protocol during bolus administration of intravenous contrast. Multiplanar CT image  reconstructions and MIPs were obtained to evaluate the vascular anatomy. CONTRAST:  100 cc Isovue 370 COMPARISON:  None. FINDINGS: Cardiovascular: The study is positive for acute pulmonary thromboembolism. There is linear low-density filling defect extending from the main left pulmonary artery into left upper and lower lobar branches. There is no evidence of right heart strain. No evidence of aortic aneurysm or dissection. Mediastinum/Nodes: No abnormal mediastinal adenopathy. Visualized thyroid is normal. No pericardial effusion. Lungs/Pleura: Tiny left pleural effusion. Dependent atelectasis towards the lung bases. There is more confluent pulmonary opacity at the left lung base over the left hemidiaphragm. Again, volume loss is favored over pulmonary infarct. Upper Abdomen: No acute abnormality. Musculoskeletal: Fusion hardware is in place extending from the cervical spine to T1. There is no vertebral compression deformity Review of the MIP images confirms the above findings. IMPRESSION: The study is positive for acute left pulmonary thromboembolism as described. Critical Value/emergent results were called by telephone at the time of interpretation on 02/14/2017 at 10:50 am to Dr. Rolland Porter , who verbally acknowledged these results. Electronically Signed   By: Merton Border  Hoss M.D.   On: 02/14/2017 10:51    Cardiac Studies:  ECG: SR rate 83 normal    Telemetry:  NSR 02/16/2017 personally reviewed   Echo: 2014 EF 60-65% personally reviewed   Medications:   . allopurinol  300 mg Oral Daily  . amLODipine  5 mg Oral QPM  . baclofen  10 mg Oral TID  . clopidogrel  75 mg Oral Daily  . diclofenac sodium  2 g Topical QID  . gabapentin  800 mg Oral TID  . hydrochlorothiazide  25 mg Oral Daily  . insulin aspart  0-9 Units Subcutaneous TID WC  . losartan  100 mg Oral Daily  . multivitamin with minerals  1 tablet Oral Daily  . rosuvastatin  40 mg Oral Daily  . senna-docusate  1 tablet Oral QHS     .  heparin 1,500 Units/hr (02/15/17 2127)    Assessment/Plan:  CAD:  Post DES to mid RCA 03/16/16 normal EF no left sided disease Brilinta changed to plavix In setting of need for anticoagulation and asa stopped PE:  Start DOAC per primary service on heparin echo pending to assess RV/RA Ortho:  Post cervical spine surgery with collar in place   Charlton Haws 02/16/2017, 10:33 AM

## 2017-02-16 NOTE — Progress Notes (Signed)
VASCULAR LAB PRELIMINARY  PRELIMINARY  PRELIMINARY  PRELIMINARY  Bilateral lower extremity venous duplex completed.    Preliminary report:  There is acute DVT noted in the posterior tibial and peroneal veins of the bilateral lower extremities.   Called report to [place], RN  Sherren Kerns, RVT 02/16/2017, 4:33 PM

## 2017-02-16 NOTE — Progress Notes (Signed)
ANTICOAGULATION CONSULT NOTE - Follow Up Consult  Pharmacy Consult for Heparin Indication: pulmonary embolus  Allergies  Allergen Reactions  . Shellfish Allergy Anaphylaxis    Patient Measurements: Height: 5\' 11"  (180.3 cm) Weight: 175 lb 7.8 oz (79.6 kg) IBW/kg (Calculated) : 75.3 Heparin Dosing Weight: 79.6 kg  Vital Signs: Temp: 98.1 F (36.7 C) (04/28 0329) Temp Source: Oral (04/28 0329) BP: 121/68 (04/28 0329) Pulse Rate: 66 (04/28 0329)  Labs:  Recent Labs  02/14/17 0624 02/14/17 1817 02/15/17 0250 02/16/17 0241  HGB 10.3*  --  10.8* 9.7*  HCT 32.7*  --  33.5* 30.7*  PLT 304  --  299 288  LABPROT 14.8  --   --   --   INR 1.16  --   --   --   HEPARINUNFRC  --  0.60 0.45 0.61  CREATININE 1.08  --  1.28*  --     Estimated Creatinine Clearance: 63.7 mL/min (A) (by C-G formula based on SCr of 1.28 mg/dL (H)).   Medications: Heparin @ 1500 units/hr  Assessment: 61yom continues on heparin for pulmonary embolus per CTA on 02/14/17. Hx cervical surgery 01/24/17. Heparin level is therapeutic at 0.61. Hgb down a little 10.8>9.7. No bleeding reported.  Goal of Therapy:  Heparin level 0.3-0.7 units/ml Monitor platelets by anticoagulation protocol: Yes   Plan:  1) Continue heparin at 1500 units/hr 2) Daily heparin level and CBC 3) Follow up transition to oral AC  Fredrik Rigger, RPh  02/16/2017,11:34 AM

## 2017-02-16 NOTE — Progress Notes (Signed)
   Subjective:   No acute events overnight. Patient was feeling better , says left arm pain improved and is able to move his fingers. His pleuritic pain has also greatly improved.    Objective:  Vital signs in last 24 hours: Vitals:   02/15/17 1336 02/15/17 1947 02/15/17 2127 02/16/17 0329  BP: 103/63 109/68 104/69 121/68  Pulse: 95 71  66  Resp: 18 20  20   Temp: 98.2 F (36.8 C) 97.6 F (36.4 C)  98.1 F (36.7 C)  TempSrc: Oral Oral  Oral  SpO2: 93% 98%  100%  Weight:      Height:       Gen. Well-developed gentleman, wearing a neck collar, in no acute distress. Lungs. Clear bilaterally CV. Regular rate and rhythm. Abdomen. Soft, nontender, bowel sounds positive. Extremities. Left arm and hand tenderness has improved,  Minimal edema around elbow extending to lower arm, radial pulse is intact  No swelling in lower extremities  Labs. CBC Latest Ref Rng & Units 02/16/2017 02/15/2017 02/14/2017  WBC 4.0 - 10.5 K/uL 5.4 6.3 8.4  Hemoglobin 13.0 - 17.0 g/dL 3.6(O) 10.8(L) 10.3(L)  Hematocrit 39.0 - 52.0 % 30.7(L) 33.5(L) 32.7(L)  Platelets 150 - 400 K/uL 288 299 304   CMP Latest Ref Rng & Units 02/15/2017 02/14/2017 02/11/2017  Glucose 65 - 99 mg/dL 294(T) 654(Y) 85  BUN 6 - 20 mg/dL 11 9 14   Creatinine 0.61 - 1.24 mg/dL 5.03(T) 4.65 6.81  Sodium 135 - 145 mmol/L 138 139 140  Potassium 3.5 - 5.1 mmol/L 3.5 3.3(L) 3.9  Chloride 101 - 111 mmol/L 106 106 107  CO2 22 - 32 mmol/L 25 25 23   Calcium 8.9 - 10.3 mg/dL 8.9 8.9 9.0  Total Protein 6.5 - 8.1 g/dL 6.4(L) - -  Total Bilirubin 0.3 - 1.2 mg/dL 0.5 - -  Alkaline Phos 38 - 126 U/L 48 - -  AST 15 - 41 U/L 16 - -  ALT 17 - 63 U/L 17 - -   Assessment/Plan:  Russell Minoris a 61 y.o.man with PMHx significant for diabetes, hypertension, CAD s/p PCI of RCA in 2017, prior cocaine abuse, cervical stenosis s/p multilevel laminectomy fusion for bilateral C5 radiculopathy 3 weeks ago,Brought to ED with left arm, hand and left sided  pleuritic chest pain started around 12:30 PM today, associated with shortness of breath. Found to have PE on CTA.  Pulmonary embolism (HCC).  patient remained stable overnight. His left arm numbness is better. Currently on heparin gtt.  -Cardiology consulted about AC_ recommend starting NOAC, and switching to plavix from brillanta  -follow up upper and lower extremity dopplers as well as echo  Left arm pain.  Likely due to DVT, but could be radiculopathy as well. We will await UE ultrasound but his pain seems to have improved   - follow up UE ultrasound   History of CAD. No ongoing chest pain. Troponin and EKG negative.  -per cardiology change brillata to plavix  -NOAC  Hypertension. Currently normotensive. -Continue current home dose of amlodipine 5 mg daily, hydrochlorothiazide 25 mg daily and losartan 100 mg daily. -Monitor blood pressure.   Cervical radiculopathy. S/P laminectomy. -Should remain in collar all the time. -He should follow-up with his neurosurgery after discharge. -Norco for pain control.  Dispo: Anticipated discharge in approximately 1 day(s).   Deneise Lever, MD 02/16/2017, 12:05 PM

## 2017-02-16 NOTE — Evaluation (Signed)
Physical Therapy Evaluation Patient Details Name: Russell Carter MRN: [id number] DOB: Mar 05, 1956 Today's Date: 02/16/2017   History of Present Illness  60 y.o. male admitted on 02/15/27 with LUE pain. Pt wad diagnosed with PE and possible DVT LUE. Pt was S/P C4-C7 ACDF and C3-T1 laminectomy with PMH including spondylosis, radiculopathy, MI, stroke, HTN, DM, arthritis, ETOH, drug abuse, L RCR, migraine, and renal insufficiency.  Clinical Impression  Pt presents with the above diagnosis and below deficits. Prior to admission, pt was living with his family in a single level home. Pt developed increased pain in LUE which has improved slightly since being hospitalized. Pt is still awaiting doppler. Pt is able to perform transfers and gait with min guard assist for safety with cervical collar in place. Pt will benefit from being followed acutely in order to address below deficits prior to DC.     Follow Up Recommendations No PT follow up    Equipment Recommendations  None recommended by PT    Recommendations for Other Services       Precautions / Restrictions Precautions Precautions: Fall;Cervical Precaution Comments: able to recall precautions Required Braces or Orthoses: Cervical Brace Cervical Brace: Hard collar Restrictions Weight Bearing Restrictions: No      Mobility  Bed Mobility               General bed mobility comments: sitting EOB when PT arrives  Transfers Overall transfer level: Needs assistance Equipment used: None Transfers: Sit to/from Stand Sit to Stand: Min guard;Supervision         General transfer comment: Min guard for safety initially, supervision following  Ambulation/Gait Ambulation/Gait assistance: Supervision;Modified independent (Device/Increase time) Ambulation Distance (Feet): 500 Feet Assistive device: None Gait Pattern/deviations: Step-through pattern;Narrow base of support Gait velocity: decreased Gait velocity interpretation: Below  normal speed for age/gender General Gait Details: Pt with good sequencing, toe in positioning with gait. pt with no dyspnea  Stairs            Wheelchair Mobility    Modified Rankin (Stroke Patients Only)       Balance Overall balance assessment: No apparent balance deficits (not formally assessed)                                 Berg Balance Test Sit to Stand: Able to stand  independently using hands Standing Unsupported: Able to stand safely 2 minutes Sitting with Back Unsupported but Feet Supported on Floor or Stool: Able to sit safely and securely 2 minutes Stand to Sit: Sits safely with minimal use of hands Transfers: Able to transfer safely, [place] use of hands Standing Unsupported with Eyes Closed: Able to stand 10 seconds safely Standing Ubsupported with Feet Together: Able to place feet together independently and stand for 1 minute with supervision From Standing, Reach Forward with Outstretched Arm: Reaches forward but needs supervision From Standing Position, Pick up Object from Floor: Able to pick up shoe, needs supervision From Standing Position, Turn to Look Behind Over each Shoulder: Turn sideways only but maintains balance Turn 360 Degrees: Able to turn 360 degrees safely in 4 seconds or less Standing Unsupported, Alternately Place Feet on Step/Stool: Able to stand independently and safely and complete 8 steps in 20 seconds Standing Unsupported, One Foot in Front: Able to plae foot ahead of the other independently and hold 30 seconds Standing on One Leg: Able to lift leg independently and hold 5-10 seconds Total Score: 46  Pertinent Vitals/Pain Pain Assessment: Faces Faces Pain Scale: Hurts even more Pain Location: LUE with mobility Pain Descriptors / Indicators: Constant;Operative site guarding;Headache Pain Intervention(s): Monitored during session;Premedicated before session    Home Living Family/patient expects to be discharged  to:: Private residence Living Arrangements: Other relatives Available Help at Discharge: Family;Available 24 hours/day Type of Home: House Home Access: Stairs to enter Entrance Stairs-Rails: None Entrance Stairs-Number of Steps: 1 Home Layout: One level Home Equipment: Production assistant, radio - single point Additional Comments: has rail in tub for his Mom to be able to get in and out of tub, she is 62 years old    Prior Function Level of Independence: Independent         Comments: works part time as Electrical engineer, 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     Higher education careers adviser   Dominant Hand: Right    Extremity/Trunk Assessment   Upper Extremity Assessment Upper Extremity Assessment: Defer to OT evaluation    Lower Extremity Assessment Lower Extremity Assessment: Overall WFL for tasks assessed    Cervical / Trunk Assessment Cervical / Trunk Assessment: Kyphotic  Communication   Communication: No difficulties  Cognition Arousal/Alertness: Awake/alert Behavior During Therapy: WFL for tasks assessed/performed;Flat affect Overall Cognitive Status: Within Functional Limits for tasks assessed                                        General Comments      Exercises     Assessment/Plan    PT Assessment Patient needs continued PT services  PT Problem List Decreased strength;Decreased activity tolerance;Decreased mobility       PT Treatment Interventions DME instruction;Gait training;Stair training;Functional mobility training;Therapeutic activities;Therapeutic exercise;Balance training    PT Goals (Current goals can be found in the Care Plan section)  Acute Rehab PT Goals Patient Stated Goal: to find out what is wrong with his left arm PT Goal Formulation: With patient Time For Goal Achievement: 02/23/17 Potential to Achieve Goals: Good    Frequency Min 3X/week   Barriers to discharge        Co-evaluation               End of Session Equipment Utilized  During Treatment: Gait belt;Cervical collar Activity Tolerance: Patient tolerated treatment well Patient left: with call bell/phone within reach;Other (comment) (sitting EOB) Nurse Communication: Mobility status PT Visit Diagnosis: Unsteadiness on feet (R26.81);Muscle weakness (generalized) (M62.81);Hemiplegia and hemiparesis;Other abnormalities of gait and mobility (R26.89);Pain Hemiplegia - Right/Left: Left Hemiplegia - dominant/non-dominant: Non-dominant Hemiplegia - caused by: Unspecified Pain - Right/Left: Left Pain - part of body: Arm    Time: 1308-6578 PT Time Calculation (min) (ACUTE ONLY): 17 min   Charges:   PT Evaluation $PT Eval Low Complexity: 1 Procedure     PT G Codes:        Colin Broach PT, DPT  [phone]   Roxy Manns 02/16/2017, 1:08 PM

## 2017-02-17 LAB — CBC
HEMATOCRIT: 30.9 % — AB (ref 39.0–52.0)
HEMOGLOBIN: 9.9 g/dL — AB (ref 13.0–17.0)
MCH: 28.7 pg (ref 26.0–34.0)
MCHC: 32 g/dL (ref 30.0–36.0)
MCV: 89.6 fL (ref 78.0–100.0)
Platelets: 277 10*3/uL (ref 150–400)
RBC: 3.45 MIL/uL — ABNORMAL LOW (ref 4.22–5.81)
RDW: 14.9 % (ref 11.5–15.5)
WBC: 4.6 10*3/uL (ref 4.0–10.5)

## 2017-02-17 LAB — HEPARIN LEVEL (UNFRACTIONATED): HEPARIN UNFRACTIONATED: 0.57 [IU]/mL (ref 0.30–0.70)

## 2017-02-17 MED ORDER — RIVAROXABAN 15 MG PO TABS
15.0000 mg | ORAL_TABLET | Freq: Once | ORAL | Status: AC
  Administered 2017-02-17: 15 mg via ORAL
  Filled 2017-02-17: qty 1

## 2017-02-17 MED ORDER — CLOPIDOGREL BISULFATE 75 MG PO TABS
75.0000 mg | ORAL_TABLET | Freq: Every day | ORAL | 0 refills | Status: DC
Start: 2017-02-18 — End: 2017-05-02

## 2017-02-17 MED ORDER — RIVAROXABAN (XARELTO) VTE STARTER PACK (15 & 20 MG): ORAL_TABLET | ORAL | 0 refills | Status: DC

## 2017-02-17 NOTE — Discharge Instructions (Signed)
Information on my medicine - XARELTO (rivaroxaban)  This medication education was reviewed with me or my healthcare representative as part of my discharge preparation.  The pharmacist that spoke with me during my hospital stay was:  Fredrik Rigger, [facility]  WHY WAS Carlena Hurl PRESCRIBED FOR YOU? Xarelto was prescribed to treat blood clots that may have been found in the veins of your legs (deep vein thrombosis) or in your lungs (pulmonary embolism) and to reduce the risk of them occurring again.  What do you need to know about Xarelto? The starting dose is one 15 mg tablet taken TWICE daily with food for the FIRST 21 DAYS then on 03/11/17  the dose is changed to one 20 mg tablet taken ONCE A DAY with your evening meal.  DO NOT stop taking Xarelto without talking to the health care provider who prescribed the medication.  Refill your prescription for 20 mg tablets before you run out.  After discharge, you should have regular check-up appointments with your healthcare provider that is prescribing your Xarelto.  In the future your dose may need to be changed if your kidney function changes by a significant amount.  What do you do if you miss a dose? If you are taking Xarelto TWICE DAILY and you miss a dose, take it as soon as you remember. You may take two 15 mg tablets (total 30 mg) at the same time then resume your regularly scheduled 15 mg twice daily the next day.  If you are taking Xarelto ONCE DAILY and you miss a dose, take it as soon as you remember on the same day then continue your regularly scheduled once daily regimen the next day. Do not take two doses of Xarelto at the same time.   Important Safety Information Xarelto is a blood thinner medicine that can cause bleeding. You should call your healthcare provider right away if you experience any of the following: ? Bleeding from an injury or your nose that does not stop. ? Unusual colored urine (red or dark brown) or unusual  colored stools (red or black). ? Unusual bruising for unknown reasons. ? A serious fall or if you hit your head (even if there is no bleeding).  Some medicines may interact with Xarelto and might increase your risk of bleeding while on Xarelto. To help avoid this, consult your healthcare provider or pharmacist prior to using any new prescription or non-prescription medications, including herbals, vitamins, non-steroidal anti-inflammatory drugs (NSAIDs) and supplements.  This website has more information on Xarelto: VisitDestination.com.br.

## 2017-02-17 NOTE — Care Management Note (Signed)
Case Management Note  Patient Details  Name: Russell Carter MRN: [id number] Date of Birth: 06-Dec-1955  Subjective/Objective:                  laminectomy Action/Plan: Discharge planning Expected Discharge Date:                  Expected Discharge Plan:  Home/Self Care  In-House Referral:     Discharge planning Services  CM Consult, Medication Assistance  Post Acute Care Choice:  NA Choice offered to:     DME Arranged:    DME Agency:  NA  HH Arranged:  NA HH Agency:  NA  Status of Service:  Completed, signed off  If discussed at Long Length of Stay Meetings, dates discussed:    Additional Comments: CM gave pt Xarelto free 30 day trial card.  Pt verbalized understanding the card will pay for today's discharge prescription and insurance will address refills. NO other CM needs were communicated. Yves Dill, RN 02/17/2017, 10:08 AM

## 2017-02-17 NOTE — Progress Notes (Signed)
   Subjective:   No acute events overnight. Patient was feeling better and was sitting up in bed with family at bedside , says left arm pain improved and is able to move his fingers. His pleuritic pain has also greatly improved.   We explained the need for xarelto, and that he will need to be anticoagulated for at least 3 months, more likely 6 months.   Discussed with CM- they will give xarelto card  Objective:  Vital signs in last 24 hours: Vitals:   02/16/17 1752 02/16/17 2024 02/17/17 0453 02/17/17 0929  BP: 127/62 115/66 (!) 127/99 111/72  Pulse:  62 78 93  Resp:  18 18   Temp:  97.7 F (36.5 C) 97.8 F (36.6 C)   TempSrc:  Oral Oral   SpO2:  100% 98%   Weight:      Height:       Gen. Well-developed gentleman, wearing a neck collar, in no acute distress. Lungs. Clear bilaterally CV. Regular rate and rhythm. Abdomen. Soft, nontender, bowel sounds positive. Extremities. Left arm and hand tenderness has improved,  Minimal edema around elbow extending to lower arm, radial pulse is intact  No swelling in lower extremities  Labs. CBC Latest Ref Rng & Units 02/17/2017 02/16/2017 02/15/2017  WBC 4.0 - 10.5 K/uL 4.6 5.4 6.3  Hemoglobin 13.0 - 17.0 g/dL 6.4(B) 5.8(X) 10.8(L)  Hematocrit 39.0 - 52.0 % 30.9(L) 30.7(L) 33.5(L)  Platelets 150 - 400 K/uL 277 288 299   CMP Latest Ref Rng & Units 02/15/2017 02/14/2017 02/11/2017  Glucose 65 - 99 mg/dL 094(M) 768(G) 85  BUN 6 - 20 mg/dL 11 9 14   Creatinine 0.61 - 1.24 mg/dL 8.81(J) 0.31 5.94  Sodium 135 - 145 mmol/L 138 139 140  Potassium 3.5 - 5.1 mmol/L 3.5 3.3(L) 3.9  Chloride 101 - 111 mmol/L 106 106 107  CO2 22 - 32 mmol/L 25 25 23   Calcium 8.9 - 10.3 mg/dL 8.9 8.9 9.0  Total Protein 6.5 - 8.1 g/dL 6.4(L) - -  Total Bilirubin 0.3 - 1.2 mg/dL 0.5 - -  Alkaline Phos 38 - 126 U/L 48 - -  AST 15 - 41 U/L 16 - -  ALT 17 - 63 U/L 17 - -   Assessment/Plan:  Russell Carter a 61 y.o.man with PMHx significant for diabetes,  hypertension, CAD s/p PCI of RCA in 2017, prior cocaine abuse, cervical stenosis s/p multilevel laminectomy fusion for bilateral C5 radiculopathy 3 weeks ago,Brought to ED with left arm, hand and left sided pleuritic chest pain started around 12:30 PM today, associated with shortness of breath. Found to have PE on CTA.  Pulmonary embolism (HCC).  patient remained stable overnight. His left arm numbness is better. Currently on heparin gtt. His upper and lower extremity dopplers were done which show acute DVTs, however given that the patient was symptomatically better, we will continue the [facility] and switch to NOAC. Marland Kitchen Echo unremarkable.   -Cardiology consulted about [facility]-  recommend starting NOAC, and switching to plavix from brillanta    Left arm pain.  Likely due to DVT, but could be radiculopathy as well. His pain and stiffness has improved.    History of CAD. No ongoing chest pain. Troponin and EKG negative. -per cardiology change brillata to plavix    Dispo: Anticipated discharge in approximately 1 day(s).   Deneise Lever, MD 02/17/2017, 9:30 AM

## 2017-02-17 NOTE — Evaluation (Signed)
Occupational Therapy Evaluation Patient Details Name: Russell Carter MRN: [id number] DOB: [birth year] Today's Date: 02/17/2017    History of Present Illness 61 y.o. male admitted on 02/15/27 with LUE pain. Pt wad diagnosed with PE and possible DVT LUE. Pt was S/P C4-C7 ACDF and C3-T1 laminectomy with PMH including spondylosis, radiculopathy, MI, stroke, HTN, DM, arthritis, ETOH, drug abuse, L RCR, migraine, and renal insufficiency.   Clinical Impression   Prior to current admission, pt was requiring assistance for ADL from family due to limited B UE AROM and functional use. He presents with decreased AROM and strength in B UE with L more significantly impacted than R. He additionally reports L UE pain especially with movement. He was able to complete toilet transfers with supervision but requires min assist over all for ADL tasks. Pt would benefit from outpatient OT services to maximize functional use of B UE. All further OT needs may be met through outpatient services. Acute OT will sign off.    Follow Up Recommendations  Outpatient OT    Equipment Recommendations  None recommended by OT    Recommendations for Other Services       Precautions / Restrictions Precautions Precautions: Fall;Cervical Precaution Comments: able to recall precautions Required Braces or Orthoses: Cervical Brace Cervical Brace: Hard collar Restrictions Weight Bearing Restrictions: No      Mobility Bed Mobility Overal bed mobility: Needs Assistance Bed Mobility: Rolling;Sidelying to Sit Rolling: Supervision Sidelying to sit: Supervision       General bed mobility comments: Supervision for safety.  Transfers Overall transfer level: Needs assistance Equipment used: None Transfers: Sit to/from Stand Sit to Stand: Supervision         General transfer comment: Supervision for safety.    Balance Overall balance assessment: No apparent balance deficits (not formally assessed)                                          ADL either performed or assessed with clinical judgement   ADL Overall ADL's : Needs assistance/impaired Eating/Feeding: Set up;Sitting   Grooming: Minimal assistance;Standing   Upper Body Bathing: Sitting;Minimal assistance   Lower Body Bathing: Minimal assistance;Sit to/from stand   Upper Body Dressing : Sitting;Minimal assistance   Lower Body Dressing: Minimal assistance;Sit to/from stand   Toilet Transfer: Supervision/safety;Ambulation;Regular Toilet   Toileting- Clothing Manipulation and Hygiene: Minimal assistance;Sit to/from stand Toileting - Clothing Manipulation Details (indicate cue type and reason): Limited B UE internal rotation to pull up pants.     Functional mobility during ADLs: Supervision/safety General ADL Comments: Pt able to verbalize cervical precautions. He reports that he has exercises at home from previous rehab stay for B UE's and was in process of setting up follow-up therapies when returned to hospital.      Vision Baseline Vision/History: Wears glasses Patient Visual Report: No change from baseline Vision Assessment?: No apparent visual deficits     Perception     Praxis      Pertinent Vitals/Pain Pain Assessment: Faces Faces Pain Scale: Hurts even more Pain Location: LUE with mobility Pain Descriptors / Indicators: Discomfort;Grimacing;Sore Pain Intervention(s): Monitored during session;Repositioned     Hand Dominance Right   Extremity/Trunk Assessment Upper Extremity Assessment Upper Extremity Assessment: RUE deficits/detail;LUE deficits/detail RUE Deficits / Details: Elbow and grasp strength WFL. Limited shoulder AROM 0-45 with upper trap hiking. LUE Deficits / Details: Pain in posterior aspect of elbow  and pain with movement 2/5 L elbow flexion and extension strength. 3/5 grasp strength. Minimal L shoulder AROM. PROM WFL but tight.   Lower Extremity Assessment Lower Extremity Assessment:  Generalized weakness       Communication Communication Communication: No difficulties   Cognition Arousal/Alertness: Awake/alert Behavior During Therapy: WFL for tasks assessed/performed;Flat affect Overall Cognitive Status: Within Functional Limits for tasks assessed                                     General Comments  Adjusted cervical collar at pt request.    Exercises Other Exercises Other Exercises: Educated pt on assisted AROM for B UE and prevention of upper trap hiking,   Shoulder Instructions      Home Living Family/patient expects to be discharged to:: Private residence Living Arrangements: Parent;Other relatives Available Help at Discharge: Family;Available 24 hours/day Type of Home: House Home Access: Stairs to enter [employer] of Steps: 1 Entrance Stairs-Rails: None Home Layout: One level     Bathroom Shower/Tub: Corporate investment banker: Standard Bathroom Accessibility: Yes   Home Equipment: Shower seat;Cane - single point;Grab bars - tub/shower          Prior Functioning/Environment Level of Independence: Needs assistance    ADL's / Homemaking Assistance Needed: Since accident required assistance from family for grooming, dressing, and bathing.            OT Problem List: Decreased strength;Decreased range of motion;Decreased activity tolerance;Impaired UE functional use;Pain      OT Treatment/Interventions:      OT Goals(Current goals can be found in the care plan section) Acute Rehab OT Goals Patient Stated Goal: to go home and walk regularly OT Goal Formulation: With patient Time For Goal Achievement: 03/03/17 Potential to Achieve Goals: Good  OT Frequency:     Barriers to D/C:            Co-evaluation              End of Session Equipment Utilized During Treatment: Cervical collar Nurse Communication: Mobility status (Outpatient OT recommendation)  Activity Tolerance:  Patient tolerated treatment well Patient left: in bed;with call bell/phone within reach (sitting at EOB)  OT Visit Diagnosis: Muscle weakness (generalized) (M62.81);Unsteadiness on feet (R26.81);Pain Pain - Right/Left: Left Pain - part of body: Arm                Time: 1317-1330 OT Time Calculation (min): 13 min Charges:  OT General Charges $OT Visit: 1 Procedure OT Evaluation $OT Eval Moderate Complexity: 1 Procedure G-Codes:     Norman Herrlich, MS OTR/L  Pager: [place] 02/17/2017, 2:33 PM

## 2017-02-17 NOTE — Progress Notes (Signed)
02/17/2017 3:50 PM Discharge AVS meds taken today and those due this evening reviewed.  Follow-up appointments and when to call md reviewed.  D/C IV and TELE.  Questions and concerns addressed.   D/C home per orders. Kathryne Hitch

## 2017-02-17 NOTE — Progress Notes (Signed)
Subjective:  Denies SSCP, palpitations or Dyspnea Neck pain   Objective:  Vitals:   02/16/17 1752 02/16/17 2024 02/17/17 0453 02/17/17 0929  BP: 127/62 115/66 (!) 127/99 111/72  Pulse:  62 78 93  Resp:  18 18   Temp:  97.7 F (36.5 C) 97.8 F (36.6 C)   TempSrc:  Oral Oral   SpO2:  100% 98%   Weight:      Height:        Intake/Output from previous day:  Intake/Output Summary (Last 24 hours) at 02/17/17 1056 Last data filed at 02/17/17 0600  Gross per 24 hour  Intake           970.75 ml  Output              300 ml  Net           670.75 ml    Physical Exam: Affect appropriate Black male with neck collar on  HEENT: normal Neck supple with no adenopathy JVP normal no bruits no thyromegaly Lungs clear with no wheezing and good diaphragmatic motion Heart:  S1/S2 no murmur, no rub, gallop or click PMI normal Abdomen: benighn, BS positve, no tenderness, no AAA no bruit.  No HSM or HJR Distal pulses intact with no bruits No edema Neuro non-focal Skin warm and dry No muscular weakness   Lab Results: Basic Metabolic Panel:  Recent Labs  14/78/29 0250  NA 138  K 3.5  CL 106  CO2 25  GLUCOSE 126*  BUN 11  CREATININE 1.28*  CALCIUM 8.9   Liver Function Tests:  Recent Labs  02/15/17 0250  AST 16  ALT 17  ALKPHOS 48  BILITOT 0.5  PROT 6.4*  ALBUMIN 2.8*   CBC:  Recent Labs  02/16/17 0241 02/17/17 0209  WBC 5.4 4.6  HGB 9.7* 9.9*  HCT 30.7* 30.9*  MCV 89.8 89.6  PLT 288 277    Imaging: No results found.  Cardiac Studies:  ECG: SR rate 83 normal    Telemetry:  NSR 02/17/2017 personally reviewed   Echo: 2014 EF 60-65% personally reviewed   Medications:    Current Facility-Administered Medications:  .  acetaminophen (TYLENOL) tablet 650 mg, 650 mg, Oral, Q6H PRN **OR** acetaminophen (TYLENOL) suppository 650 mg, 650 mg, Rectal, Q6H PRN, Deneise Lever, MD .  allopurinol (ZYLOPRIM) tablet 300 mg, 300 mg, Oral, Daily, Deneise Lever,  MD, 300 mg at 02/17/17 0928 .  amLODipine (NORVASC) tablet 5 mg, 5 mg, Oral, QPM, Deneise Lever, MD, 5 mg at 02/16/17 1752 .  baclofen (LIORESAL) tablet 10 mg, 10 mg, Oral, TID, Deneise Lever, MD, 10 mg at 02/17/17 0928 .  clopidogrel (PLAVIX) tablet 75 mg, 75 mg, Oral, Daily, Peter M [place], MD, 75 mg at 02/17/17 5621 .  diclofenac sodium (VOLTAREN) 1 % transdermal gel 2 g, 2 g, Topical, QID, Alexa R Burns, MD, 2 g at 02/17/17 0929 .  gabapentin (NEURONTIN) capsule 800 mg, 800 mg, Oral, TID, Deneise Lever, MD, 800 mg at 02/17/17 0928 .  hydrochlorothiazide (HYDRODIURIL) tablet 25 mg, 25 mg, Oral, Daily, Deneise Lever, MD, 25 mg at 02/17/17 3086 .  HYDROcodone-acetaminophen (NORCO) 7.5-325 MG per tablet 1-2 tablet, 1-2 tablet, Oral, Q4H PRN, Fuller Plan, MD, 2 tablet at 02/16/17 1906 .  losartan (COZAAR) tablet 100 mg, 100 mg, Oral, Daily, Gust Rung, DO, 100 mg at 02/17/17 5784 .  multivitamin with minerals tablet 1 tablet, 1 tablet, Oral, Daily, Deneise Lever, MD, 1 tablet  at 02/17/17 0928 .  nitroGLYCERIN (NITROSTAT) SL tablet 0.4 mg, 0.4 mg, Sublingual, Q5 min PRN, Deneise Lever, MD .  promethazine (PHENERGAN) tablet 12.5 mg, 12.5 mg, Oral, Q6H PRN, Deneise Lever, MD .  rosuvastatin (CRESTOR) tablet 40 mg, 40 mg, Oral, Daily, Deneise Lever, MD, 40 mg at 02/17/17 0928 .  senna-docusate (Senokot-S) tablet 1 tablet, 1 tablet, Oral, QHS, Deneise Lever, MD, 1 tablet at 02/16/17 2144    Assessment/Plan:  CAD:  Post DES to mid RCA 03/16/16 normal EF no left sided disease Brilinta changed to plavix In setting of need for anticoagulation and asa stopped PE: xarelto 15 bid echo reviewed from yesterday and EF 60-65% no cor pulmonale  Ortho:  Post cervical spine surgery with collar in place   DC home today   Charlton Haws 02/17/2017, 10:56 AM

## 2017-02-18 ENCOUNTER — Ambulatory Visit: Payer: Medicaid Other | Admitting: Family Medicine

## 2017-02-20 ENCOUNTER — Telehealth: Payer: Self-pay

## 2017-02-20 NOTE — Telephone Encounter (Signed)
Recieved faxed medication prior authorization request for patients oxycodone 30mg , it states on the prior Auth that the patient was to receive 42 tabs and take it BID for 21 days, on patients medication list, it was listed as being only used for 5 days 01/2417 - 02/17/17 with a note of stop taking at discharge, and has been removed from patients active medication list all together.  No sure if Im to complete this patients prior Auth or not,  Please advise

## 2017-02-21 ENCOUNTER — Encounter: Payer: Self-pay | Admitting: Physical Medicine & Rehabilitation

## 2017-02-21 ENCOUNTER — Encounter: Payer: Medicaid Other | Attending: Physical Medicine & Rehabilitation | Admitting: Physical Medicine & Rehabilitation

## 2017-02-21 VITALS — BP 108/75 | HR 108

## 2017-02-21 DIAGNOSIS — I2699 Other pulmonary embolism without acute cor pulmonale: Secondary | ICD-10-CM

## 2017-02-21 DIAGNOSIS — F1429 Cocaine dependence with unspecified cocaine-induced disorder: Secondary | ICD-10-CM | POA: Insufficient documentation

## 2017-02-21 DIAGNOSIS — Z8673 Personal history of transient ischemic attack (TIA), and cerebral infarction without residual deficits: Secondary | ICD-10-CM | POA: Diagnosis not present

## 2017-02-21 DIAGNOSIS — I2510 Atherosclerotic heart disease of native coronary artery without angina pectoris: Secondary | ICD-10-CM | POA: Insufficient documentation

## 2017-02-21 DIAGNOSIS — R040 Epistaxis: Secondary | ICD-10-CM | POA: Insufficient documentation

## 2017-02-21 DIAGNOSIS — M4802 Spinal stenosis, cervical region: Secondary | ICD-10-CM | POA: Diagnosis not present

## 2017-02-21 DIAGNOSIS — E7800 Pure hypercholesterolemia, unspecified: Secondary | ICD-10-CM | POA: Diagnosis not present

## 2017-02-21 DIAGNOSIS — F329 Major depressive disorder, single episode, unspecified: Secondary | ICD-10-CM | POA: Insufficient documentation

## 2017-02-21 DIAGNOSIS — I252 Old myocardial infarction: Secondary | ICD-10-CM | POA: Insufficient documentation

## 2017-02-21 DIAGNOSIS — E1122 Type 2 diabetes mellitus with diabetic chronic kidney disease: Secondary | ICD-10-CM | POA: Insufficient documentation

## 2017-02-21 DIAGNOSIS — N183 Chronic kidney disease, stage 3 (moderate): Secondary | ICD-10-CM | POA: Diagnosis not present

## 2017-02-21 DIAGNOSIS — G8918 Other acute postprocedural pain: Secondary | ICD-10-CM

## 2017-02-21 DIAGNOSIS — R5381 Other malaise: Secondary | ICD-10-CM | POA: Diagnosis not present

## 2017-02-21 DIAGNOSIS — F1010 Alcohol abuse, uncomplicated: Secondary | ICD-10-CM | POA: Insufficient documentation

## 2017-02-21 DIAGNOSIS — I129 Hypertensive chronic kidney disease with stage 1 through stage 4 chronic kidney disease, or unspecified chronic kidney disease: Secondary | ICD-10-CM | POA: Diagnosis not present

## 2017-02-21 DIAGNOSIS — Z981 Arthrodesis status: Secondary | ICD-10-CM | POA: Insufficient documentation

## 2017-02-21 DIAGNOSIS — M109 Gout, unspecified: Secondary | ICD-10-CM | POA: Diagnosis not present

## 2017-02-21 DIAGNOSIS — M5410 Radiculopathy, site unspecified: Secondary | ICD-10-CM

## 2017-02-21 DIAGNOSIS — R269 Unspecified abnormalities of gait and mobility: Secondary | ICD-10-CM

## 2017-02-21 DIAGNOSIS — M4722 Other spondylosis with radiculopathy, cervical region: Secondary | ICD-10-CM

## 2017-02-21 NOTE — Patient Instructions (Signed)
[address], [place], [place] [address]   Phone: [phone]

## 2017-02-21 NOTE — Discharge Summary (Signed)
Name: Russell Carter MRN: [id number] DOB: [birth year] 61 y.o. PCP: No Pcp Per Patient  Date of Admission: 02/14/2017  6:13 AM Date of Discharge: 02/17/17 Attending Physician: Meredith Leeds DO Discharge Diagnosis: 1. Pulmonary embolism. 2. Bilateral DVT 3. Superficial thrombophlebitis of left arm.  Principal Problem:   Pulmonary embolism (HCC) Active Problems:   Essential hypertension   Diabetes mellitus type 2 in nonobese [facility])   Discharge Medications: Allergies as of 02/17/2017      Reactions   Shellfish Allergy Anaphylaxis      Medication List    STOP taking these medications   aspirin 81 MG EC tablet   oxyCODONE 30 MG 12 hr tablet   oxyCODONE 5 MG immediate release tablet Commonly known as:  Oxy IR/ROXICODONE   ticagrelor 90 MG Tabs tablet Commonly known as:  BRILINTA     TAKE these medications   allopurinol 300 MG tablet Commonly known as:  ZYLOPRIM Take 1 tablet (300 mg total) by mouth daily.   amLODipine 5 MG tablet Commonly known as:  NORVASC Take 1 tablet (5 mg total) by mouth daily. What changed:  when to take this   baclofen 10 MG tablet Commonly known as:  LIORESAL Take 1 tablet (10 mg total) by mouth 3 (three) times daily.   CALCIUM-VITAMIN D PO Take 1 tablet by mouth daily.   clopidogrel 75 MG tablet Commonly known as:  PLAVIX Take 1 tablet (75 mg total) by mouth daily.   docusate sodium 100 MG capsule Commonly known as:  COLACE Take 1 capsule (100 mg total) by mouth 2 (two) times daily.   gabapentin 400 MG capsule Commonly known as:  NEURONTIN Take 2 capsules (800 mg total) by mouth 3 (three) times daily.   hydrochlorothiazide 25 MG tablet Commonly known as:  HYDRODIURIL Take 1 tablet (25 mg total) by mouth daily.   losartan 100 MG tablet Commonly known as:  COZAAR Take 100 mg by mouth every morning.   methocarbamol 750 MG tablet Commonly known as:  ROBAXIN Take 1 tablet (750 mg total) by mouth every 6 (six) hours as needed for  muscle spasms.   multivitamin with minerals Tabs tablet Take 1 tablet by mouth daily.   nitroGLYCERIN 0.4 MG SL tablet Commonly known as:  NITROSTAT Place 1 tablet (0.4 mg total) under the tongue every 5 (five) minutes as needed for chest pain.   pantoprazole 40 MG tablet Commonly known as:  PROTONIX Take 1 tablet (40 mg total) by mouth at bedtime.   Rivaroxaban 15 & 20 MG Tbpk Commonly known as:  XARELTO STARTER PACK Take as directed on package: Start with one 15mg  tablet by mouth twice a day with food. On Day 22, switch to one 20mg  tablet once a day with food.   rosuvastatin 40 MG tablet Commonly known as:  CRESTOR Take 1 tablet (40 mg total) by mouth daily.       Disposition and follow-up:   Russell Carter was discharged from [facility] in Good condition.  At the hospital follow up visit please address:  1.  His breathing status and his compliance with Xarelto.  2.  Labs / imaging needed at time of follow-up: None  3.  Pending labs/ test needing follow-up: None  Follow-up Appointments: Follow-up Information    [facility]. Go to.   Why:  pl make appt Contact information: [address] [place] [address] [phone]  Hospital Course by problem list: Russell Carter a 61 y.o.man with PMHx significant for diabetes, hypertension, CAD s/p PCI of RCA in 2017,prior cocaine abuse,cervical stenosis s/p multilevel laminectomy fusion for bilateral C5 radiculopathy 3 weeks ago,Brought to ED with left arm, hand and left sided pleuritic chest pain started around 12:30 PM today,associated with shortness of breath. Found to have PE on CTA.  Pulmonary embolism (HCC). Patient remained stable during the course of his stay in hospital. He was found to have bilateral lower extremity DVT with left upper extremity superficial thrombosis most likely the cause of his left arm pain. He was on aspirin  and Brilinta after having a PCI last year, cardiology was consulted and he was switched to Plavix along with Xarelto for anticoagulation.  History of CAD. He did not had any chest pain and his troponin was negative and EKG was without any acute change. He was on aspirin and Brilinta and later both was converted to Plavix along with Xarelto.  Hypertension. He remained normotensive during his stay. We continued his home dose of amlodipine, hydrochlorothiazide and losartan.  Diabetes. Currently diet controlled. Previous A1c done on 01/21/2017 was 6.2. He was  On sliding scale.  Discharge Vitals:   BP 111/72 (BP Location: Left Arm)   Pulse 93   Temp 97.8 F (36.6 C) (Oral)   Resp 18   Ht 5\' 11"  (1.803 m)   Wt 175 lb 7.8 oz (79.6 kg)   SpO2 98%   BMI 24.48 kg/m   Pertinent Labs, Studies, and Procedures:  CBC Latest Ref Rng & Units 02/17/2017 02/16/2017 02/15/2017  WBC 4.0 - 10.5 K/uL 4.6 5.4 6.3  Hemoglobin 13.0 - 17.0 g/dL 9.0(W) 4.0(X) 10.8(L)  Hematocrit 39.0 - 52.0 % 30.9(L) 30.7(L) 33.5(L)  Platelets 150 - 400 K/uL 277 288 299   CMP Latest Ref Rng & Units 02/15/2017 02/14/2017 02/11/2017  Glucose 65 - 99 mg/dL 735(H) 299(M) 85  BUN 6 - 20 mg/dL 11 9 14   Creatinine 0.61 - 1.24 mg/dL 4.26(S) 3.41 9.62  Sodium 135 - 145 mmol/L 138 139 140  Potassium 3.5 - 5.1 mmol/L 3.5 3.3(L) 3.9  Chloride 101 - 111 mmol/L 106 106 107  CO2 22 - 32 mmol/L 25 25 23   Calcium 8.9 - 10.3 mg/dL 8.9 8.9 9.0  Total Protein 6.5 - 8.1 g/dL 6.4(L) - -  Total Bilirubin 0.3 - 1.2 mg/dL 0.5 - -  Alkaline Phos 38 - 126 U/L 48 - -  AST 15 - 41 U/L 16 - -  ALT 17 - 63 U/L 17 - -   Drugs of Abuse     Component Value Date/Time   LABOPIA POSITIVE (A) 02/14/2017 1156   COCAINSCRNUR NONE DETECTED 02/14/2017 1156   COCAINSCRNUR NEG 03/24/2012 0947   LABBENZ NONE DETECTED 02/14/2017 1156   LABBENZ NEG 03/24/2012 0947   AMPHETMU NONE DETECTED 02/14/2017 1156   THCU NONE DETECTED 02/14/2017 1156   LABBARB NONE  DETECTED 02/14/2017 1156    CXR: FINDINGS: There is shallow inspiration. Left lung base linear and platelike atelectasis. There is no focal consolidation, pleural effusion, or pneumothorax. The cardiac silhouette is within normal limits. There has been interval placement of cervical spine fusion hardware. No acute osseous pathology.  IMPRESSION: Shallow inspiration with bibasilar atelectatic changes. No focal consolidation.  CTA Chest. FINDINGS: Cardiovascular: The study is positive for acute pulmonary thromboembolism. There is linear low-density filling defect extending from the main left pulmonary artery into left upper and lower lobar branches. There is  no evidence of right heart strain. No evidence of aortic aneurysm or dissection.  Mediastinum/Nodes: No abnormal mediastinal adenopathy. Visualized thyroid is normal. No pericardial effusion.  Lungs/Pleura: Tiny left pleural effusion. Dependent atelectasis towards the lung bases. There is more confluent pulmonary opacity at the left lung base over the left hemidiaphragm. Again, volume loss is favored over pulmonary infarct.  Upper Abdomen: No acute abnormality.  Musculoskeletal: Fusion hardware is in place extending from the cervical spine to T1. There is no vertebral compression deformity  Review of the MIP images confirms the above findings.  IMPRESSION: The study is positive for acute left pulmonary thromboembolism as described.   Lower extremity Doppler.  Summary: There is acute DVT noted in the distal right posterior tibial and peroneal veins as well as throughout the left posterior tibial and peroneal veins. Sluggish flow noted throughout the bilateral lower extremities.  Upper extremity Dopplers. Summary: No evidence of deep vein thrombosis involving the left upper extremity. There is acute superficial thrombosis noted in the left cephalic vein at the [facility].  ECHO. Study Conclusions  - Left ventricle: The  cavity size was normal. Wall thickness was increased in a pattern of mild LVH. Systolic function was normal.   The estimated ejection fraction was in the range of 60% to 65%.   Wall motion was normal; there were no regional wall motion abnormalities. Doppler parameters are consistent with abnormal left ventricular relaxation (grade 1 diastolic dysfunction). - Aortic valve: There was no stenosis. - Mitral valve: There was no significant regurgitation. - Right ventricle: The cavity size was normal. Systolic functio was normal. - Pulmonary arteries: No complete TR doppler jet so unable to estimate PA systolic pressure. - Inferior vena cava: The vessel was normal in size. The respirophasic diameter changes were in the normal range (>= 50%), consistent with normal central venous pressure.  Impressions:  - Normal LV size with mild LV hypertrophy. EF 60-65%. Normal RV size and systolic function. No significant valvular abnormalities.  Discharge Instructions: Discharge Instructions    Call MD for:  difficulty breathing, headache or visual disturbances    Complete by:  As directed    Diet - low sodium heart healthy    Complete by:  As directed    Increase activity slowly    Complete by:  As directed       Signed: Arnetha Courser, MD 02/21/2017, 2:00 PM   Pager: [phone]

## 2017-02-21 NOTE — Progress Notes (Signed)
Subjective:    Patient ID: Russell Carter, male    DOB: 04/01/1956, 61 y.o.   MRN: [id number]  HPI 61 year old right-handed male, history of CAD with myocardial infarction, maintained on aspirin and Brilinta; cervical stenosis; cocaine and alcohol abuse; depression; gout; diabetes mellitus; CKD, stage III presents for transitional care management after receiving CIR for cervical spondylosis with radiculopathy.   DATE OF ADMISSION:  01/28/2017 DATE OF DISCHARGE:  02/12/2017  At discharge, he was instructed to wear his C-collar, which he has been complaint with.  He has not made an appointment with Neurosurgery.  Since last visit he went to the ED for PE.  He had epistaxis this AM for the firs time. Pain continues to complain of pain, which is improved with meds. Overall, pt is upset at lack of ability to care for himself. Denies falls.   DME: None required Mobility: Cane at all times.   Pain Inventory Average Pain 8 Pain Right Now 7 My pain is sharp  In the last 24 hours, has pain interfered with the following? General activity 7 Relation with others 7 Enjoyment of life 4 What TIME of day is your pain at its worst? night Sleep (in general) Fair  Pain is worse with: sitting and some activites Pain improves with: rest and medication Relief from Meds: 8  Mobility use a cane ability to climb steps?  yes do you drive?  no  Function disabled: date disabled 2015 I need assistance with the following:  feeding, dressing, bathing, toileting, meal prep, household duties and shopping  Neuro/Psych weakness numbness tingling dizziness depression  Prior Studies Any changes since last visit?  no  Physicians involved in your care Any changes since last visit?  no   Family History  Problem Relation Age of Onset  . Heart attack Mother     x7. Still alive  . Heart attack Father     deceased b/c of MI  . Heart attack Brother     older, deceased b/c of MI    Social History     Social History  . Marital status: Widowed    Spouse name: N/A  . Number of children: N/A  . Years of education: N/A   Social History Main Topics  . Smoking status: Never Smoker  . Smokeless tobacco: Never Used  . Alcohol use 13.8 oz/week    23 Cans of beer per week     Comment: 03/14/2016 "40oz beer/day"  . Drug use: Yes    Types: "Crack" cocaine, Marijuana, Cocaine     Comment: 03/14/2016 "quit 2008"  . Sexual activity: Not Currently   Other Topics Concern  . None   Social History Narrative   Lives in [place] with his wife and son.    Past Surgical History:  Procedure Laterality Date  . ANTERIOR CERVICAL DECOMP/DISCECTOMY FUSION N/A 01/24/2017   Procedure: Cervical Four-Five,Cervical Five Six,Cervical Six-Seven Anterior cervical discectomy with fusion and plate with Cervical Three-Thoracic- One Laminectomy/Foraminotomy with Cervical Three-Thoracic- One Fixation and fusion;  Surgeon: Loura Halt Ditty, MD;  Location: [facility] OR;  Service: Neurosurgery;  Laterality: N/A;  Part-1 anterior approach  . CARDIAC CATHETERIZATION  2008; ~ 2011   [place]-40%lad  . CARDIAC CATHETERIZATION N/A 03/16/2016   Procedure: Left Heart Cath and Coronary Angiography;  Surgeon: Marykay Lex, MD;  Location: [facility] INVASIVE CV LAB;  Service: Cardiovascular;  Laterality: N/A;  . CORONARY ANGIOPLASTY     STENTS  . EYE SURGERY Bilateral    "for double vision"  .  INGUINAL HERNIA REPAIR  05/12/2012   Procedure: HERNIA REPAIR INGUINAL ADULT;  Surgeon: Liz Malady, MD;  Location: [facility];  Service: General;  Laterality: Right;  . INGUINAL HERNIA REPAIR Left 02/02/2003   Dr. Violeta Gelinas. repair with mesh. Same procedure done again in  05/22/2004   . INGUINAL HERNIA REPAIR Left ?date 2nd OR  . POSTERIOR CERVICAL FUSION/FORAMINOTOMY N/A 01/24/2017   Procedure: Cervical Three-Thoracic One-Laminectomy/Foraminotomy with Cervical Three-Thoracic One Fixation and fusion;  Surgeon: Loura Halt  Ditty, MD;  Location: [facility] OR;  Service: Neurosurgery;  Laterality: N/A;  Part-2 Posterior approach  . RECONSTRUCT / STABILIZE DISTAL ULNA    . SHOULDER OPEN ROTATOR CUFF REPAIR Left 2010  . SHOULDER SURGERY Left    "injured on the job; had to cut small bone out"  . THUMB FUSION Right    Past Medical History:  Diagnosis Date  . Anginal pain (HCC)   . Arthritis   . Blood in stool   . CAD (coronary artery disease)    a. 2011: cath showing mod non-obstructive disease b. 02/2016: cath with 95% stenosis in the Mid RCA. DES placed.  . Cervical stenosis of spine   . Cocaine abuse    history  . Depression   . Diabetes mellitus without complication (HCC)   . Gout, unspecified   . Headache   . HTN (hypertension)   . Hypercholesterolemia   . Hyperglycemia   . Migraines   . Myocardial infarction [facility]) 2008   drug cocaine and marijuana at time of mi none  since    . Pneumonia   . Renal insufficiency   . Stroke (HCC)    BP 108/75   Pulse (!) 108   SpO2 97%   Opioid Risk Score:   Fall Risk Score:  `1  Depression screen PHQ 2/9  No flowsheet data found.  Review of Systems  Constitutional: Negative.   HENT: Negative.   Eyes: Negative.   Respiratory: Positive for cough.   Cardiovascular: Negative.   Gastrointestinal: Negative.   Endocrine: Negative.   Genitourinary: Negative.   Musculoskeletal: Positive for joint swelling.  Skin: Negative.   Allergic/Immunologic: Negative.   Neurological: Negative.   Hematological: Bruises/bleeds easily.  Psychiatric/Behavioral: Negative.   All other systems reviewed and are negative.      Objective:   Physical Exam Constitutional: He appears well-developed and well-nourished. NAD. HENT: Normocephalic and atraumatic. Epistaxis.  Eyes: EOMI. No discharge.  Neck: Cervical collar in place  Cardiovascular: RRR. No JVD. Respiratory: CTA B. Unlabored.  GI: Soft. Bowel sounds are normal.  Musculoskeletal: He exhibits edema. He exhibits no  tenderness.  Neurological: He is alert.  Sensation diminished to light touch in shoulders, hands, feet-unchanged Motor: B/l LE: 4+/5 proximal to distal RUE: Shoulder abduction 3-/5, elbow flexion/extension 4/5, hand 4/5  LUE: Shoulder abduction 2/5 on left, elbow flexion 2/5, elbow extension, 3/5, HI 4/5.  Skin: Wound dressing c/d/i  Psychiatric: pleasant and appropriate. Upset with current situation.     Assessment & Plan:  61 year old right-handed male, history of CAD with myocardial infarction, maintained on aspirin and Brilinta; cervical stenosis; cocaine and alcohol abuse; depression; gout; diabetes mellitus; CKD, stage III presents for transitional care management after receiving CIR for cervical spondylosis with radiculopathy.   1.  Cervical spondylosis with radiculopathy s/p C4-7 anterior discectomy and fusion, posterior cervical fusion C3-C6.   Cont Cervical collar all times  Follow up with Dr. Bevely Palmer- pt needs to make appointment  2. PE  Pt  took Xarelto this AM, instructed patient to follow up with PCP/Heme/Onc if uncontrolled bleeding  3. Epistaxis   Cont pressure, cold  See #2  4. Pain Management  Cont current meds: OxyContin 30 mg every 12, Neurontin 600 mg 3 times a day, Robaxin BID, baclofen 10mg  daily  Encouraged to increase Baclofen to TID if needed  5. Gait abnormality  Cont cane for safety.  Meds reviewed Referrals reviewed- Neurosurgery information provided All questions answered

## 2017-02-22 ENCOUNTER — Encounter (HOSPITAL_COMMUNITY): Payer: Self-pay | Admitting: Emergency Medicine

## 2017-02-22 ENCOUNTER — Emergency Department (HOSPITAL_COMMUNITY)
Admission: EM | Admit: 2017-02-22 | Discharge: 2017-02-22 | Disposition: A | Discharge status: Home / Self Care | Payer: Medicaid Other | Attending: Emergency Medicine | Admitting: Emergency Medicine

## 2017-02-22 DIAGNOSIS — Z7901 Long term (current) use of anticoagulants: Secondary | ICD-10-CM | POA: Diagnosis not present

## 2017-02-22 DIAGNOSIS — I2510 Atherosclerotic heart disease of native coronary artery without angina pectoris: Secondary | ICD-10-CM | POA: Insufficient documentation

## 2017-02-22 DIAGNOSIS — Z8673 Personal history of transient ischemic attack (TIA), and cerebral infarction without residual deficits: Secondary | ICD-10-CM | POA: Diagnosis not present

## 2017-02-22 DIAGNOSIS — E119 Type 2 diabetes mellitus without complications: Secondary | ICD-10-CM | POA: Insufficient documentation

## 2017-02-22 DIAGNOSIS — Z955 Presence of coronary angioplasty implant and graft: Secondary | ICD-10-CM | POA: Insufficient documentation

## 2017-02-22 DIAGNOSIS — R040 Epistaxis: Secondary | ICD-10-CM | POA: Insufficient documentation

## 2017-02-22 DIAGNOSIS — I252 Old myocardial infarction: Secondary | ICD-10-CM | POA: Diagnosis not present

## 2017-02-22 DIAGNOSIS — Z79899 Other long term (current) drug therapy: Secondary | ICD-10-CM | POA: Insufficient documentation

## 2017-02-22 LAB — I-STAT CHEM 8, ED
BUN: 19 mg/dL (ref 6–20)
CALCIUM ION: 1.13 mmol/L — AB (ref 1.15–1.40)
Chloride: 101 mmol/L (ref 101–111)
Creatinine, Ser: 1.4 mg/dL — ABNORMAL HIGH (ref 0.61–1.24)
Glucose, Bld: 103 mg/dL — ABNORMAL HIGH (ref 65–99)
HCT: 30 % — ABNORMAL LOW (ref 39.0–52.0)
Hemoglobin: 10.2 g/dL — ABNORMAL LOW (ref 13.0–17.0)
Potassium: 3.8 mmol/L (ref 3.5–5.1)
SODIUM: 137 mmol/L (ref 135–145)
TCO2: 28 mmol/L (ref 0–100)

## 2017-02-22 MED ORDER — OXYMETAZOLINE HCL 0.05 % NA SOLN
1.0000 | Freq: Once | NASAL | Status: AC
  Administered 2017-02-22: 1 via NASAL
  Filled 2017-02-22: qty 15

## 2017-02-22 NOTE — ED Triage Notes (Signed)
Onset one day ago developed nose bleed both nostrils however majority right nostril. Bleeding stop and returned today. Concerned because of Plavix. Currently nose bleed is resolved denies pain. Airway intact bilateral equal chest rise and fall.

## 2017-02-22 NOTE — ED Notes (Signed)
ED Provider at bedside. 

## 2017-02-22 NOTE — ED Provider Notes (Signed)
[facility]PT Provider Note   CSN: [id number] Arrival date & time: 02/22/17  1020     History   Chief Complaint Chief Complaint  Patient presents with  . Epistaxis    HPI Russell Carter is a 61 y.o. male.  The history is provided by the patient.  Epistaxis   This is a new problem. The current episode started yesterday. The problem occurs constantly. The problem has been resolved. The problem is associated with anticoagulants. The bleeding has been from the right nare. He has tried a nasal tampon and applying pressure for the symptoms. The treatment provided significant relief. His past medical history is significant for sinus problems. His past medical history does not include bleeding disorder or frequent nosebleeds.   Patient reports putting in packing in his nose last night and noticing that this morning when he took it out it continued to bleed however it spontaneously resolved on his way into the emergency department. He has not been more than 2 hours since that has resolved without recurrence.  He denies any other physical complaints. Past Medical History:  Diagnosis Date  . Anginal pain (HCC)   . Arthritis   . Blood in stool   . CAD (coronary artery disease)    a. 2011: cath showing mod non-obstructive disease b. 02/2016: cath with 95% stenosis in the Mid RCA. DES placed.  . Cervical stenosis of spine   . Cocaine abuse    history  . Depression   . Diabetes mellitus without complication (HCC)   . Gout, unspecified   . Headache   . HTN (hypertension)   . Hypercholesterolemia   . Hyperglycemia   . Migraines   . Myocardial infarction [facility]) 2008   drug cocaine and marijuana at time of mi none  since    . Pneumonia   . Renal insufficiency   . Stroke [facility])     Patient Active Problem List   Diagnosis Date Noted  . Pulmonary embolism (HCC) 02/14/2017  . Debility   . Post-operative pain   . Radicular pain   . Diabetes mellitus type 2 in nonobese (HCC)   .  Surgery, elective   . Postoperative pain   . Leukocytosis   . Thrombocytopenia (HCC)   . Acute blood loss anemia   . History of myocardial infarction   . Cocaine abuse   . Depression   . Type 2 diabetes mellitus with hyperglycemia, without long-term current use of insulin (HCC)   . Stage 3 chronic kidney disease   . Benign essential HTN   . History of CVA (cerebrovascular accident)   . Prediabetes   . Cervical spondylosis with radiculopathy 01/24/2017  . Dyslipidemia 03/15/2016  . Unstable angina (HCC) - chest pain with complete heart block & chest pain 03/15/2016  . Complete heart block (HCC) 03/14/2016  . Cervical spinal stenosis 03/14/2016  . Chest pain with moderate risk of acute coronary syndrome 03/13/2016  . CAD-50% LAD 2011 12/14/2014  . Gout 11/16/2014  . Headache 07/15/2013  . Polysubstance abuse 01/15/2013  . DIZZINESS 06/05/2010  . Essential hypertension 05/18/2009  . Acute myocardial infarction (HCC) 05/18/2009  . MUSCLE SPASM 05/18/2009    Past Surgical History:  Procedure Laterality Date  . ANTERIOR CERVICAL DECOMP/DISCECTOMY FUSION N/A 01/24/2017   Procedure: Cervical Four-Five,Cervical Five Six,Cervical Six-Seven Anterior cervical discectomy with fusion and plate with Cervical Three-Thoracic- One Laminectomy/Foraminotomy with Cervical Three-Thoracic- One Fixation and fusion;  Surgeon: Loura Halt Ditty, MD;  Location: [facility] OR;  Service: Neurosurgery;  Laterality: N/A;  Part-1 anterior approach  . CARDIAC CATHETERIZATION  2008; ~ 2011   [place]-40%lad  . CARDIAC CATHETERIZATION N/A 03/16/2016   Procedure: Left Heart Cath and Coronary Angiography;  Surgeon: Marykay Lex, MD;  Location: [facility] INVASIVE CV LAB;  Service: Cardiovascular;  Laterality: N/A;  . CORONARY ANGIOPLASTY     STENTS  . EYE SURGERY Bilateral    "for double vision"  . INGUINAL HERNIA REPAIR  05/12/2012   Procedure: HERNIA REPAIR INGUINAL ADULT;  Surgeon: Liz Malady, MD;  Location: [name];  Service: General;  Laterality: Right;  . INGUINAL HERNIA REPAIR Left 02/02/2003   Dr. Violeta Gelinas. repair with mesh. Same procedure done again in  05/22/2004   . INGUINAL HERNIA REPAIR Left ?date 2nd OR  . POSTERIOR CERVICAL FUSION/FORAMINOTOMY N/A 01/24/2017   Procedure: Cervical Three-Thoracic One-Laminectomy/Foraminotomy with Cervical Three-Thoracic One Fixation and fusion;  Surgeon: Loura Halt Ditty, MD;  Location: [facility] OR;  Service: Neurosurgery;  Laterality: N/A;  Part-2 Posterior approach  . RECONSTRUCT / STABILIZE DISTAL ULNA    . SHOULDER OPEN ROTATOR CUFF REPAIR Left 2010  . SHOULDER SURGERY Left    "injured on the job; had to cut small bone out"  . THUMB FUSION Right        Home Medications    Prior to Admission medications   Medication Sig Start Date End Date Taking? Authorizing Provider  allopurinol (ZYLOPRIM) 300 MG tablet Take 1 tablet (300 mg total) by mouth daily. 03/17/16   Ellsworth Lennox, PA-C  amLODipine (NORVASC) 5 MG tablet Take 1 tablet (5 mg total) by mouth daily. Patient taking differently: Take 5 mg by mouth every evening.  01/18/17   Wendall Stade, MD  baclofen (LIORESAL) 10 MG tablet Take 1 tablet (10 mg total) by mouth 3 (three) times daily. 02/12/17   Mcarthur Rossetti Angiulli, PA-C  CALCIUM-VITAMIN D PO Take 1 tablet by mouth daily.    Historical Provider, MD  clopidogrel (PLAVIX) 75 MG tablet Take 1 tablet (75 mg total) by mouth daily. 02/18/17   Deneise Lever, MD  docusate sodium (COLACE) 100 MG capsule Take 1 capsule (100 mg total) by mouth 2 (two) times daily. 02/12/17   Mcarthur Rossetti Angiulli, PA-C  gabapentin (NEURONTIN) 400 MG capsule Take 2 capsules (800 mg total) by mouth 3 (three) times daily. 02/12/17   Mcarthur Rossetti Angiulli, PA-C  hydrochlorothiazide (HYDRODIURIL) 25 MG tablet Take 1 tablet (25 mg total) by mouth daily. 03/29/16   Leone Brand, NP  losartan (COZAAR) 100 MG tablet Take 100 mg by mouth every morning.  01/04/17   Wendall Stade,  MD  methocarbamol (ROBAXIN) 750 MG tablet Take 1 tablet (750 mg total) by mouth every 6 (six) hours as needed for muscle spasms. 02/12/17   Mcarthur Rossetti Angiulli, PA-C  Multiple Vitamin (MULTIVITAMIN WITH MINERALS) TABS tablet Take 1 tablet by mouth daily.    Historical Provider, MD  nitroGLYCERIN (NITROSTAT) 0.4 MG SL tablet Place 1 tablet (0.4 mg total) under the tongue every 5 (five) minutes as needed for chest pain. 03/29/16   Leone Brand, NP  pantoprazole (PROTONIX) 40 MG tablet Take 1 tablet (40 mg total) by mouth at bedtime. 02/12/17   Mcarthur Rossetti Angiulli, PA-C  Rivaroxaban (XARELTO STARTER PACK) 15 & 20 MG TBPK Take as directed on package: Start with one 15mg  tablet by mouth twice a day with food. On Day 22, switch to one 20mg  tablet once a day with food.  02/17/17   Deneise Lever, MD  rosuvastatin (CRESTOR) 40 MG tablet Take 1 tablet (40 mg total) by mouth daily. 01/04/17 04/04/17  Wendall Stade, MD    Family History Family History  Problem Relation Age of Onset  . Heart attack Mother     x7. Still alive  . Heart attack Father     deceased b/c of MI  . Heart attack Brother     older, deceased b/c of MI     Social History Social History  Substance Use Topics  . Smoking status: Never Smoker  . Smokeless tobacco: Never Used  . Alcohol use No     Allergies   Shellfish allergy   Review of Systems Review of Systems  HENT: Positive for nosebleeds.   All other systems are reviewed and are negative for acute change except as noted in the HPI    Physical Exam Updated Vital Signs BP 124/80 (BP Location: Left Arm)   Pulse 80   Temp 99.2 F (37.3 C) (Oral)   Resp 20   Ht 5\' 11"  (1.803 m)   Wt 175 lb (79.4 kg)   SpO2 97%   BMI 24.41 kg/m   Physical Exam  Constitutional: He is oriented to person, place, and time. He appears well-developed and well-nourished. No distress.  HENT:  Head: Normocephalic and atraumatic.  Right Ear: External ear normal.  Left Ear: External ear  normal.  Nose: Nose normal.  Mouth/Throat: Mucous membranes are normal. No trismus in the jaw.  Eyes: Conjunctivae and EOM are normal. No scleral icterus.  Neck: Normal range of motion and phonation normal.  Patient with a cervical collar. Anterior incision for cervical spine surgery looks clean, dry, intact, well-healing.  Cardiovascular: Normal rate and regular rhythm.   Pulmonary/Chest: Effort normal. No stridor. No respiratory distress.  Abdominal: He exhibits no distension.  Musculoskeletal: Normal range of motion. He exhibits no edema.  Neurological: He is alert and oriented to person, place, and time.  Skin: He is not diaphoretic.  Psychiatric: He has a normal mood and affect. His behavior is normal.  Vitals reviewed.    ED Treatments / Results  Labs (all labs ordered are listed, but only abnormal results are displayed) Labs Reviewed  I-STAT CHEM 8, ED - Abnormal; Notable for the following:       Result Value   Creatinine, Ser 1.40 (*)    Glucose, Bld 103 (*)    Calcium, Ion 1.13 (*)    Hemoglobin 10.2 (*)    HCT 30.0 (*)    All other components within normal limits    EKG  EKG Interpretation None       Radiology No results found.  Procedures Procedures (including critical care time)  Medications Ordered in ED Medications  oxymetazoline (AFRIN) 0.05 % nasal spray 1 spray (not administered)     Initial Impression / Assessment and Plan / ED Course  I have reviewed the triage vital signs and the nursing notes.  Pertinent labs & imaging results that were available during my care of the patient were reviewed by me and considered in my medical decision making (see chart for details).     Right anterior nares epistaxis now resolved. Patient is anticoagulated on Xarelto. Several hours since resolution without recurrence. Hemoglobin of 10.2 is above his prior 5 days ago. Specific instructions given for Afrin use.   The patient is safe for discharge with strict  return precautions.   Final Clinical Impressions(s) / ED Diagnoses  Final diagnoses:  Right-sided epistaxis   Disposition: Discharge  Condition: Good  I have discussed the results, Dx and Tx plan with the patient who expressed understanding and agree(s) with the plan. Discharge instructions discussed at great length. The patient was given strict return precautions who verbalized understanding of the instructions. No further questions at time of discharge.    New Prescriptions   No medications on file    Follow Up: Primary care provider  Schedule an appointment as soon as possible for a visit  As needed      Nira Conn, MD 02/22/17 1346

## 2017-03-12 ENCOUNTER — Encounter (HOSPITAL_COMMUNITY): Payer: Self-pay | Admitting: Emergency Medicine

## 2017-03-12 ENCOUNTER — Emergency Department (HOSPITAL_COMMUNITY): Payer: Medicaid Other

## 2017-03-12 ENCOUNTER — Emergency Department (HOSPITAL_COMMUNITY)
Admission: EM | Admit: 2017-03-12 | Discharge: 2017-03-13 | Disposition: A | Discharge status: Home / Self Care | Payer: Medicaid Other | Attending: Emergency Medicine | Admitting: Emergency Medicine

## 2017-03-12 DIAGNOSIS — R109 Unspecified abdominal pain: Secondary | ICD-10-CM | POA: Diagnosis present

## 2017-03-12 DIAGNOSIS — I252 Old myocardial infarction: Secondary | ICD-10-CM | POA: Diagnosis not present

## 2017-03-12 DIAGNOSIS — Z79899 Other long term (current) drug therapy: Secondary | ICD-10-CM | POA: Diagnosis not present

## 2017-03-12 DIAGNOSIS — Z8673 Personal history of transient ischemic attack (TIA), and cerebral infarction without residual deficits: Secondary | ICD-10-CM | POA: Diagnosis not present

## 2017-03-12 DIAGNOSIS — I10 Essential (primary) hypertension: Secondary | ICD-10-CM | POA: Diagnosis not present

## 2017-03-12 DIAGNOSIS — I2510 Atherosclerotic heart disease of native coronary artery without angina pectoris: Secondary | ICD-10-CM | POA: Diagnosis not present

## 2017-03-12 DIAGNOSIS — R1084 Generalized abdominal pain: Secondary | ICD-10-CM | POA: Diagnosis not present

## 2017-03-12 DIAGNOSIS — E119 Type 2 diabetes mellitus without complications: Secondary | ICD-10-CM | POA: Diagnosis not present

## 2017-03-12 DIAGNOSIS — Z7901 Long term (current) use of anticoagulants: Secondary | ICD-10-CM | POA: Insufficient documentation

## 2017-03-12 DIAGNOSIS — M79642 Pain in left hand: Secondary | ICD-10-CM

## 2017-03-12 LAB — CBC WITH DIFFERENTIAL/PLATELET
BASOS ABS: 0 10*3/uL (ref 0.0–0.1)
Basophils Relative: 0 %
EOS PCT: 1 %
Eosinophils Absolute: 0.1 10*3/uL (ref 0.0–0.7)
HCT: 30.8 % — ABNORMAL LOW (ref 39.0–52.0)
HEMOGLOBIN: 9.9 g/dL — AB (ref 13.0–17.0)
LYMPHS PCT: 37 %
Lymphs Abs: 2.1 10*3/uL (ref 0.7–4.0)
MCH: 28.3 pg (ref 26.0–34.0)
MCHC: 32.1 g/dL (ref 30.0–36.0)
MCV: 88 fL (ref 78.0–100.0)
Monocytes Absolute: 0.4 10*3/uL (ref 0.1–1.0)
Monocytes Relative: 7 %
NEUTROS PCT: 55 %
Neutro Abs: 3.2 10*3/uL (ref 1.7–7.7)
PLATELETS: 304 10*3/uL (ref 150–400)
RBC: 3.5 MIL/uL — AB (ref 4.22–5.81)
RDW: 15.7 % — ABNORMAL HIGH (ref 11.5–15.5)
WBC: 5.8 10*3/uL (ref 4.0–10.5)

## 2017-03-12 LAB — I-STAT TROPONIN, ED: Troponin i, poc: 0 ng/mL (ref 0.00–0.08)

## 2017-03-12 LAB — COMPREHENSIVE METABOLIC PANEL
ALT: 12 U/L — ABNORMAL LOW (ref 17–63)
AST: 13 U/L — ABNORMAL LOW (ref 15–41)
Albumin: 3.3 g/dL — ABNORMAL LOW (ref 3.5–5.0)
Alkaline Phosphatase: 62 U/L (ref 38–126)
Anion gap: 6 (ref 5–15)
BUN: 14 mg/dL (ref 6–20)
CO2: 23 mmol/L (ref 22–32)
Calcium: 9 mg/dL (ref 8.9–10.3)
Chloride: 109 mmol/L (ref 101–111)
Creatinine, Ser: 1.13 mg/dL (ref 0.61–1.24)
GFR calc Af Amer: >60 mL/min (ref >60)
GFR calc non Af Amer: >60 mL/min (ref >60)
Glucose, Bld: 94 mg/dL (ref 65–99)
Potassium: 3.5 mmol/L (ref 3.5–5.1)
Sodium: 138 mmol/L (ref 135–145)
Total Bilirubin: 0.5 mg/dL (ref 0.3–1.2)
Total Protein: 6 g/dL — ABNORMAL LOW (ref 6.5–8.1)

## 2017-03-12 LAB — LIPASE, BLOOD: Lipase: 29 U/L (ref 11–51)

## 2017-03-12 MED ORDER — HYDROMORPHONE HCL 1 MG/ML IJ SOLN
1.0000 mg | Freq: Once | INTRAMUSCULAR | Status: AC
  Administered 2017-03-12: 1 mg via INTRAVENOUS
  Filled 2017-03-12: qty 1

## 2017-03-12 MED ORDER — IOPAMIDOL (ISOVUE-300) INJECTION 61%
INTRAVENOUS | Status: AC
  Administered 2017-03-13: 100 mL
  Filled 2017-03-12: qty 100

## 2017-03-12 NOTE — ED Triage Notes (Signed)
Per [facility], Pt reports abdominal pain starting Sunday. Pt reports neck and arm pain starting Thursday. Pt was seen in hospital for blood clots in his L arm, lungs and leg about 3 weeks ago. Pt states the pain in his abdomen worsens after eating. Pt reports SHOB starting today. Pt alert and oriented. Pt was taken off of aspirin and put on xarelto. Pt complains of left hand swelling and numbness. L hand noted to be swollen, hot to touch. Radial pulses +2 on left and right.

## 2017-03-12 NOTE — ED Provider Notes (Signed)
[facility] Provider Note   CSN: [id number] Arrival date & time: 03/12/17  1902     History   Chief Complaint No chief complaint on file.   HPI Russell Carter is a 61 y.o. male.  The history is provided by the patient. No language interpreter was used.   Russell Carter is a 61 y.o. male who presents to the Emergency Department complaining of abdominal pain.  He presents for evaluation of abdominal pain, shortness of breath, arm pain. He recently had a cervical fusion that was, complicated by DVT and PE. He has been compliant with his Xarelto as prescribed but notes over the last few days he's experienced epigastric abdominal pain, nausea, increased shortness of breath. He also endorses increased pain in his left arm (left arm/hand pain has been present since his surgery on April 4). Abdominal pain is worse with eating. Symptoms are severe, constant, worsening. He denies any fevers, vomiting, cough, diarrhea, hematochezia, melena, dysuria. Past Medical History:  Diagnosis Date  . Anginal pain (HCC)   . Arthritis   . Blood in stool   . CAD (coronary artery disease)    a. 2011: cath showing mod non-obstructive disease b. 02/2016: cath with 95% stenosis in the Mid RCA. DES placed.  . Cervical stenosis of spine   . Cocaine abuse    history  . Depression   . Diabetes mellitus without complication (HCC)   . Gout, unspecified   . Headache   . HTN (hypertension)   . Hypercholesterolemia   . Hyperglycemia   . Migraines   . Myocardial infarction [facility]) 2008   drug cocaine and marijuana at time of mi none  since    . Pneumonia   . Renal insufficiency   . Stroke [facility])     Patient Active Problem List   Diagnosis Date Noted  . Pulmonary embolism (HCC) 02/14/2017  . Debility   . Post-operative pain   . Radicular pain   . Diabetes mellitus type 2 in nonobese (HCC)   . Surgery, elective   . Postoperative pain   . Leukocytosis   . Thrombocytopenia (HCC)   . Acute blood loss  anemia   . History of myocardial infarction   . Cocaine abuse   . Depression   . Type 2 diabetes mellitus with hyperglycemia, without long-term current use of insulin (HCC)   . Stage 3 chronic kidney disease   . Benign essential HTN   . History of CVA (cerebrovascular accident)   . Prediabetes   . Cervical spondylosis with radiculopathy 01/24/2017  . Dyslipidemia 03/15/2016  . Unstable angina (HCC) - chest pain with complete heart block & chest pain 03/15/2016  . Complete heart block (HCC) 03/14/2016  . Cervical spinal stenosis 03/14/2016  . Chest pain with moderate risk of acute coronary syndrome 03/13/2016  . CAD-50% LAD 2011 12/14/2014  . Gout 11/16/2014  . Headache 07/15/2013  . Polysubstance abuse 01/15/2013  . DIZZINESS 06/05/2010  . Essential hypertension 05/18/2009  . Acute myocardial infarction (HCC) 05/18/2009  . MUSCLE SPASM 05/18/2009    Past Surgical History:  Procedure Laterality Date  . ANTERIOR CERVICAL DECOMP/DISCECTOMY FUSION N/A 01/24/2017   Procedure: Cervical Four-Five,Cervical Five Six,Cervical Six-Seven Anterior cervical discectomy with fusion and plate with Cervical Three-Thoracic- One Laminectomy/Foraminotomy with Cervical Three-Thoracic- One Fixation and fusion;  Surgeon: Loura Halt Ditty, MD;  Location: [facility] OR;  Service: Neurosurgery;  Laterality: N/A;  Part-1 anterior approach  . CARDIAC CATHETERIZATION  2008; ~ 2011   [place]-40%lad  .  CARDIAC CATHETERIZATION N/A 03/16/2016   Procedure: Left Heart Cath and Coronary Angiography;  Surgeon: Marykay Lex, MD;  Location: [facility] INVASIVE CV LAB;  Service: Cardiovascular;  Laterality: N/A;  . CORONARY ANGIOPLASTY     STENTS  . EYE SURGERY Bilateral    "for double vision"  . INGUINAL HERNIA REPAIR  05/12/2012   Procedure: HERNIA REPAIR INGUINAL ADULT;  Surgeon: Liz Malady, MD;  Location: [facility];  Service: General;  Laterality: Right;  . INGUINAL HERNIA REPAIR Left 02/02/2003   Dr.  Violeta Gelinas. repair with mesh. Same procedure done again in  05/22/2004   . INGUINAL HERNIA REPAIR Left ?date 2nd OR  . POSTERIOR CERVICAL FUSION/FORAMINOTOMY N/A 01/24/2017   Procedure: Cervical Three-Thoracic One-Laminectomy/Foraminotomy with Cervical Three-Thoracic One Fixation and fusion;  Surgeon: Loura Halt Ditty, MD;  Location: [facility] OR;  Service: Neurosurgery;  Laterality: N/A;  Part-2 Posterior approach  . RECONSTRUCT / STABILIZE DISTAL ULNA    . SHOULDER OPEN ROTATOR CUFF REPAIR Left 2010  . SHOULDER SURGERY Left    "injured on the job; had to cut small bone out"  . THUMB FUSION Right        Home Medications    Prior to Admission medications   Medication Sig Start Date End Date Taking? Authorizing Provider  allopurinol (ZYLOPRIM) 300 MG tablet Take 1 tablet (300 mg total) by mouth daily. 03/17/16  Yes Strader, [place] M, PA-C  amLODipine (NORVASC) 5 MG tablet Take 1 tablet (5 mg total) by mouth daily. Patient taking differently: Take 5 mg by mouth every evening.  01/18/17  Yes Wendall Stade, MD  baclofen (LIORESAL) 10 MG tablet Take 1 tablet (10 mg total) by mouth 3 (three) times daily. 02/12/17  Yes Angiulli, Mcarthur Rossetti, PA-C  CALCIUM-VITAMIN D PO Take 1 tablet by mouth daily.   Yes [provider]  clopidogrel (PLAVIX) 75 MG tablet Take 1 tablet (75 mg total) by mouth daily. 02/18/17  Yes Deneise Lever, MD  docusate sodium (COLACE) 100 MG capsule Take 1 capsule (100 mg total) by mouth 2 (two) times daily. 02/12/17  Yes Angiulli, Mcarthur Rossetti, PA-C  gabapentin (NEURONTIN) 400 MG capsule Take 2 capsules (800 mg total) by mouth 3 (three) times daily. 02/12/17  Yes Angiulli, Mcarthur Rossetti, PA-C  hydrochlorothiazide (HYDRODIURIL) 25 MG tablet Take 1 tablet (25 mg total) by mouth daily. 03/29/16  Yes Leone Brand, NP  losartan (COZAAR) 100 MG tablet Take 100 mg by mouth every morning.  01/04/17  Yes Wendall Stade, MD  methocarbamol (ROBAXIN) 750 MG tablet Take 1 tablet (750 mg total)  by mouth every 6 (six) hours as needed for muscle spasms. 02/12/17  Yes Angiulli, Mcarthur Rossetti, PA-C  Multiple Vitamin (MULTIVITAMIN WITH MINERALS) TABS tablet Take 1 tablet by mouth daily.   Yes [provider]  pantoprazole (PROTONIX) 40 MG tablet Take 1 tablet (40 mg total) by mouth at bedtime. 02/12/17  Yes Angiulli, Mcarthur Rossetti, PA-C  Rivaroxaban (XARELTO STARTER PACK) 15 & 20 MG TBPK Take as directed on package: Start with one 15mg  tablet by mouth twice a day with food. On Day 22, switch to one 20mg  tablet once a day with food. Patient taking differently: Take 20 mg by mouth daily. Take as directed on package: Start with one 15mg  tablet by mouth twice a day with food. On Day 22, switch to one 20mg  tablet once a day with food. 02/17/17  Yes Deneise Lever, MD  rosuvastatin (CRESTOR) 40 MG tablet  Take 1 tablet (40 mg total) by mouth daily. 01/04/17 04/04/17 Yes Wendall Stade, MD  nitroGLYCERIN (NITROSTAT) 0.4 MG SL tablet Place 1 tablet (0.4 mg total) under the tongue every 5 (five) minutes as needed for chest pain. 03/29/16   Leone Brand, NP    Family History Family History  Problem Relation Age of Onset  . Heart attack Mother        x7. Still alive  . Heart attack Father        deceased b/c of MI  . Heart attack Brother        older, deceased b/c of MI     Social History Social History  Substance Use Topics  . Smoking status: Never Smoker  . Smokeless tobacco: Never Used  . Alcohol use No     Allergies   Shellfish allergy   Review of Systems Review of Systems  All other systems reviewed and are negative.    Physical Exam Updated Vital Signs BP (!) 144/97   Pulse 87   Temp 99.1 F (37.3 C) (Oral)   Resp 20   Ht 5\' 11"  (1.803 m)   Wt 82.6 kg (182 lb)   SpO2 100%   BMI 25.38 kg/m   Physical Exam  Constitutional: He is oriented to person, place, and time. He appears well-developed and well-nourished.  Uncomfortable appearing  HENT:  Head: Normocephalic and  atraumatic.  Cardiovascular: Normal rate and regular rhythm.   No murmur heard. Pulmonary/Chest: Effort normal and breath sounds normal. No respiratory distress.  Abdominal: Soft. There is no rebound and no guarding.  Moderate epigastric and upper abdominal tenderness without guarding or rebound.  Musculoskeletal:  2+ radial pulses bilaterally, 2+ DP pulses bilaterally. There is tenderness to palpation throughout the left wrist and hand. Mild swelling to the left dorsal hand. Left lower extremity is internally rotated. Unable to range the left hip.  Neurological: He is alert and oriented to person, place, and time.  3 out of 5 strength in the left hand and distal arm. Patient states this is chronic since his surgery in April. 4+ out of 5 strength in the left lower extremity.  Skin: Skin is warm and dry.  Psychiatric: He has a normal mood and affect. His behavior is normal.  Nursing note and vitals reviewed.    ED Treatments / Results  Labs (all labs ordered are listed, but only abnormal results are displayed) Labs Reviewed  COMPREHENSIVE METABOLIC PANEL - Abnormal; Notable for the following:       Result Value   Total Protein 6.0 (*)    Albumin 3.3 (*)    AST 13 (*)    ALT 12 (*)    All other components within normal limits  CBC WITH DIFFERENTIAL/PLATELET - Abnormal; Notable for the following:    RBC 3.50 (*)    Hemoglobin 9.9 (*)    HCT 30.8 (*)    RDW 15.7 (*)    All other components within normal limits  LIPASE, BLOOD  URINALYSIS, ROUTINE W REFLEX MICROSCOPIC  I-STAT TROPOININ, ED    EKG  EKG Interpretation  Date/Time:  Tuesday Mar 12 2017 19:18:42 EDT Ventricular Rate:  88 PR Interval:    QRS Duration: 96 QT Interval:  361 QTC Calculation: 437 R Axis:   34 Text Interpretation:  Sinus rhythm Abnormal R-wave progression, early transition Confirmed by Lincoln Brigham [phone]) on 03/12/2017 7:28:56 PM       Radiology Dg Chest 2 View  Result Date:  03/12/2017 CLINICAL  DATA:  Shortness of breath and LEFT chest pain. EXAM: CHEST  2 VIEW COMPARISON:  Chest radiograph February 14, 2017. FINDINGS: Cardiomediastinal silhouette is unremarkable for this low inspiratory examination with crowded vasculature markings. The lungs are clear without pleural effusions or focal consolidations. Bibasilar strandy densities. Trachea projects midline and there is no pneumothorax. Included soft tissue planes and osseous structures are non-suspicious. Severe degenerative change of the LEFT acromioclavicular joint. Cervical spine hardware. IMPRESSION: Bibasilar atelectasis in this low inspiratory examination. Electronically Signed   By: Awilda Metro M.D.   On: 03/12/2017 21:32   Dg Hip Unilat W Or Wo Pelvis 2-3 Views Left  Result Date: 03/12/2017 CLINICAL DATA:  LEFT hip pain, no injury. EXAM: DG HIP (WITH OR WITHOUT PELVIS) 2-3V LEFT COMPARISON:  None. FINDINGS: There is no evidence of hip fracture or dislocation. There is no evidence of arthropathy or other focal bone abnormality. Small heterotopic ossification medial mid to distal femur soft tissues. IMPRESSION: Negative. Electronically Signed   By: Awilda Metro M.D.   On: 03/12/2017 21:33    Procedures Procedures (including critical care time)  Medications Ordered in ED Medications  iopamidol (ISOVUE-300) 61 % injection (not administered)  HYDROmorphone (DILAUDID) injection 1 mg (1 mg Intravenous Given 03/12/17 2025)  HYDROmorphone (DILAUDID) injection 1 mg (1 mg Intravenous Given 03/12/17 2323)     Initial Impression / Assessment and Plan / ED Course  I have reviewed the triage vital signs and the nursing notes.  Pertinent labs & imaging results that were available during my care of the patient were reviewed by me and considered in my medical decision making (see chart for details).     Patient here following recent cervical fusion and PE here for evaluation of abdominal pain. In terms of abdominal pain, he does have  upper abdominal and epigastric tenderness on examination without any peritoneal findings. After pain medicine in the department he had persistent upper abdominal pain. Will obtain CT abdomen to further evaluate.  On initial evaluation his left lower extremity was internally rotated and he said he could not move it, imaging was obtained and is negative for acute fracture or dislocation. Repeat examination he is able to range the hip and leg without difficulty and states that he no longer has pain when he moves his leg. He does complain of left hand pain that has been present since the surgery but is worsening. He does have decreased strength in the hand with diffuse tenderness to palpation but no evidence of acute infectious process.  Patient care transferred pending CT abdomen. If CT is negative for acute process and he can tolerate oral fluids and he can be discharged home with outpatient follow-up.  Final Clinical Impressions(s) / ED Diagnoses   Final diagnoses:  None    New Prescriptions New Prescriptions   No medications on file     Tilden Fossa, MD 03/13/17 [phone]

## 2017-03-13 ENCOUNTER — Emergency Department (HOSPITAL_COMMUNITY): Payer: Medicaid Other

## 2017-03-13 MED ORDER — HYDROMORPHONE HCL 1 MG/ML IJ SOLN
0.5000 mg | Freq: Once | INTRAMUSCULAR | Status: AC
  Administered 2017-03-13: 0.5 mg via INTRAVENOUS
  Filled 2017-03-13: qty 1

## 2017-03-13 NOTE — ED Provider Notes (Signed)
AP, anorexia, increased pain with eating Pending CT abd/pel Anticipate discharge home with f/u Ditty as scheduled Dx with new PE after surgery, now on Xarelto No SOB  CT abd/pel are negative for acute finding. He can be discharged home per plan of previous treatment team.    Elpidio Anis, PA-C 03/16/17 2139    Tilden Fossa, MD 03/17/17 [phone]

## 2017-03-25 ENCOUNTER — Encounter: Payer: Medicaid Other | Attending: Physical Medicine & Rehabilitation | Admitting: Physical Medicine & Rehabilitation

## 2017-03-25 ENCOUNTER — Encounter: Payer: Self-pay | Admitting: Physical Medicine & Rehabilitation

## 2017-03-25 VITALS — BP 152/93 | HR 103 | Resp 14

## 2017-03-25 DIAGNOSIS — E7800 Pure hypercholesterolemia, unspecified: Secondary | ICD-10-CM | POA: Insufficient documentation

## 2017-03-25 DIAGNOSIS — F1010 Alcohol abuse, uncomplicated: Secondary | ICD-10-CM | POA: Insufficient documentation

## 2017-03-25 DIAGNOSIS — M4802 Spinal stenosis, cervical region: Secondary | ICD-10-CM | POA: Insufficient documentation

## 2017-03-25 DIAGNOSIS — E1122 Type 2 diabetes mellitus with diabetic chronic kidney disease: Secondary | ICD-10-CM | POA: Insufficient documentation

## 2017-03-25 DIAGNOSIS — I2510 Atherosclerotic heart disease of native coronary artery without angina pectoris: Secondary | ICD-10-CM | POA: Diagnosis present

## 2017-03-25 DIAGNOSIS — M109 Gout, unspecified: Secondary | ICD-10-CM | POA: Insufficient documentation

## 2017-03-25 DIAGNOSIS — I10 Essential (primary) hypertension: Secondary | ICD-10-CM

## 2017-03-25 DIAGNOSIS — I252 Old myocardial infarction: Secondary | ICD-10-CM | POA: Insufficient documentation

## 2017-03-25 DIAGNOSIS — Z981 Arthrodesis status: Secondary | ICD-10-CM | POA: Diagnosis not present

## 2017-03-25 DIAGNOSIS — F329 Major depressive disorder, single episode, unspecified: Secondary | ICD-10-CM | POA: Diagnosis not present

## 2017-03-25 DIAGNOSIS — M4722 Other spondylosis with radiculopathy, cervical region: Secondary | ICD-10-CM | POA: Insufficient documentation

## 2017-03-25 DIAGNOSIS — R040 Epistaxis: Secondary | ICD-10-CM | POA: Diagnosis not present

## 2017-03-25 DIAGNOSIS — F1429 Cocaine dependence with unspecified cocaine-induced disorder: Secondary | ICD-10-CM | POA: Insufficient documentation

## 2017-03-25 DIAGNOSIS — N183 Chronic kidney disease, stage 3 (moderate): Secondary | ICD-10-CM | POA: Insufficient documentation

## 2017-03-25 DIAGNOSIS — I129 Hypertensive chronic kidney disease with stage 1 through stage 4 chronic kidney disease, or unspecified chronic kidney disease: Secondary | ICD-10-CM | POA: Insufficient documentation

## 2017-03-25 DIAGNOSIS — Z8673 Personal history of transient ischemic attack (TIA), and cerebral infarction without residual deficits: Secondary | ICD-10-CM | POA: Diagnosis not present

## 2017-03-25 DIAGNOSIS — I2782 Chronic pulmonary embolism: Secondary | ICD-10-CM | POA: Diagnosis not present

## 2017-03-25 DIAGNOSIS — R269 Unspecified abnormalities of gait and mobility: Secondary | ICD-10-CM | POA: Insufficient documentation

## 2017-03-25 MED ORDER — OXYCODONE-ACETAMINOPHEN 7.5-325 MG PO TABS: 1.0000 | ORAL_TABLET | Freq: Three times a day (TID) | ORAL | 0 refills | Status: DC | PRN

## 2017-03-25 MED ORDER — GABAPENTIN 400 MG PO CAPS: 800.0000 mg | ORAL_CAPSULE | Freq: Four times a day (QID) | ORAL | 3 refills | Status: DC

## 2017-03-25 MED ORDER — RIVAROXABAN 20 MG PO TABS: 20.0000 mg | ORAL_TABLET | Freq: Every day | ORAL | 4 refills | Status: DC

## 2017-03-25 NOTE — Patient Instructions (Signed)
KEEP YOUR LEFT ARM ELEVATED WHILE SEATED AND IN BED. TRY TO MASSAGE AND "MILK" THE HAND WHEN YOU CAN.

## 2017-03-25 NOTE — Progress Notes (Signed)
Subjective:    Patient ID: Russell Carter, male    DOB: Apr 26, 1956, 61 y.o.   MRN: [id number]  HPI   Russell Carter is here in follow up of his cervical stenosis and myelopathy. He was in the hospital with a DVT/PE a month ago for which he's now on xarelto. We had check dopplers while he was on inpatient rehab. He developed epistaxis after starting on a/c but this has since stopped.   He is having severe pain in his neck, shoulders and arms. The pain is sharp and throbbing as well as a pins and needles pain, especially on the left arm. He's also had ongoing swelling in the left arm.    Pain Inventory Average Pain 10 Pain Right Now 10 My pain is constant, sharp and throbbing  In the last 24 hours, has pain interfered with the following? General activity 10 Relation with others 0 Enjoyment of life 10 What TIME of day is your pain at its worst? morning, evening Sleep (in general) Poor  Pain is worse with: walking, sitting and standing Pain improves with: rest and medication Relief from Meds: 6  Mobility walk with assistance use a cane how many minutes can you walk? 30 ability to climb steps?  yes do you drive?  no needs help with transfers  Function retired I need assistance with the following:  dressing, bathing, toileting, meal prep and household duties  Neuro/Psych weakness numbness tingling trouble walking spasms dizziness depression  Prior Studies Any changes since last visit?  no  Physicians involved in your care Any changes since last visit?  no   Family History  Problem Relation Age of Onset  . Heart attack Mother        x7. Still alive  . Heart attack Father        deceased b/c of MI  . Heart attack Brother        older, deceased b/c of MI    Social History   Social History  . Marital status: Widowed    Spouse name: N/A  . Number of children: N/A  . Years of education: N/A   Social History Main Topics  . Smoking status: Never Smoker  .  Smokeless tobacco: Never Used  . Alcohol use No  . Drug use: No     Comment: 03/14/2016 "quit 2008"  . Sexual activity: Not Currently   Other Topics Concern  . None   Social History Narrative   Lives in [place] with his wife and son.    Past Surgical History:  Procedure Laterality Date  . ANTERIOR CERVICAL DECOMP/DISCECTOMY FUSION N/A 01/24/2017   Procedure: Cervical Four-Five,Cervical Five Six,Cervical Six-Seven Anterior cervical discectomy with fusion and plate with Cervical Three-Thoracic- One Laminectomy/Foraminotomy with Cervical Three-Thoracic- One Fixation and fusion;  Surgeon: Loura Halt Ditty, MD;  Location: [facility] OR;  Service: Neurosurgery;  Laterality: N/A;  Part-1 anterior approach  . CARDIAC CATHETERIZATION  2008; ~ 2011   [place]-40%lad  . CARDIAC CATHETERIZATION N/A 03/16/2016   Procedure: Left Heart Cath and Coronary Angiography;  Surgeon: Marykay Lex, MD;  Location: [facility] INVASIVE CV LAB;  Service: Cardiovascular;  Laterality: N/A;  . CORONARY ANGIOPLASTY     STENTS  . EYE SURGERY Bilateral    "for double vision"  . INGUINAL HERNIA REPAIR  05/12/2012   Procedure: HERNIA REPAIR INGUINAL ADULT;  Surgeon: Liz Malady, MD;  Location: [facility];  Service: General;  Laterality: Right;  . INGUINAL HERNIA REPAIR Left 02/02/2003  Dr. Violeta Gelinas. repair with mesh. Same procedure done again in  05/22/2004   . INGUINAL HERNIA REPAIR Left ?date 2nd OR  . POSTERIOR CERVICAL FUSION/FORAMINOTOMY N/A 01/24/2017   Procedure: Cervical Three-Thoracic One-Laminectomy/Foraminotomy with Cervical Three-Thoracic One Fixation and fusion;  Surgeon: Loura Halt Ditty, MD;  Location: [facility] OR;  Service: Neurosurgery;  Laterality: N/A;  Part-2 Posterior approach  . RECONSTRUCT / STABILIZE DISTAL ULNA    . SHOULDER OPEN ROTATOR CUFF REPAIR Left 2010  . SHOULDER SURGERY Left    "injured on the job; had to cut small bone out"  . THUMB FUSION Right    Past Medical History:    Diagnosis Date  . Anginal pain (HCC)   . Arthritis   . Blood in stool   . CAD (coronary artery disease)    a. 2011: cath showing mod non-obstructive disease b. 02/2016: cath with 95% stenosis in the Mid RCA. DES placed.  . Cervical stenosis of spine   . Cocaine abuse    history  . Depression   . Diabetes mellitus without complication (HCC)   . Gout, unspecified   . Headache   . HTN (hypertension)   . Hypercholesterolemia   . Hyperglycemia   . Migraines   . Myocardial infarction [facility]) 2008   drug cocaine and marijuana at time of mi none  since    . Pneumonia   . Renal insufficiency   . Stroke (HCC)    BP (!) 152/93 (BP Location: Right Arm, Patient Position: Sitting, Cuff Size: Normal)   Pulse (!) 103   Resp 14   SpO2 98%   Opioid Risk Score:   Fall Risk Score:  `1  Depression screen PHQ 2/9  Depression screen PHQ 2/9 02/21/2017  Decreased Interest 0  Down, Depressed, Hopeless 1  PHQ - 2 Score 1  Altered sleeping 0  Tired, decreased energy 0  Change in appetite 0  Feeling bad or failure about yourself  0  Trouble concentrating 0  Moving slowly or fidgety/restless 0  Suicidal thoughts 0  PHQ-9 Score 1  Difficult doing work/chores Not difficult at all    Review of Systems  Constitutional: Positive for appetite change and diaphoresis.  HENT: Negative.   Eyes: Negative.   Respiratory: Positive for cough and shortness of breath.   Cardiovascular: Positive for leg swelling.  Gastrointestinal: Negative.   Genitourinary: Positive for difficulty urinating.  Musculoskeletal: Positive for arthralgias, back pain, gait problem, joint swelling and neck pain.  Skin: Negative.   Neurological: Positive for weakness and numbness.       Tingling  Hematological: Bruises/bleeds easily.  Psychiatric/Behavioral: Positive for dysphoric mood.       Objective:   Physical Exam  Constitutional: He appears well-developed and well-nourished. NAD. HENT: Normocephalic and  atraumatic. Epistaxis.  Eyes: EOMI. No discharge.  Neck: Cervical collar in place Cardiovascular: RRR no JVD Respiratory: CTA B.  GI: Soft. Bowel sounds are normal.  Musculoskeletal: He exhibits edema 2+.  He exhibits tenderness in the left hand. 1/2" sublux left shoulder. ROM limited in both shoulders. He is functionally able to range Right arm for personal activities.  Neurological: He is alert.  Sensation diminished to light touch in shoulders, hands, feet-unchanged Motor: B/l LE: 4+/5 proximal to distal. Uses cane for balance RUE: Shoulder abduction 3+ to 4-/5, elbow flexion/extension 4/5, hand 4/5  LUE: Shoulder abduction 2/5 on left, elbow flexion 2/5, elbow extension, 3/5, HI 2/5.  Skin: left hand normal temperature and color. No breakdown.  Psychiatric:  pleasant and appropriate. Upset with current situation.     Assessment & Plan:    1. Cervical spondylosis with radiculopathy s/p C4-7 anterior discectomy and fusion, posterior cervical fusion C3-C6.              Cont Cervical collar per Dr. Bevely Palmer             -made a referral to [facility] for OT, to address upper extremities which are his biggest active issue, particularly the left arm. He may be limited due to MCD restrictions  -provided him instructions on massage and edema mgt for home.   2. PE            continue xarelto 20mg  daily. Refilled rx today---will need for 6 months at least  3. Epistaxis              -this has resolved               4. Pain Management              ,-Neurontin increased to 800mg  qid  - Robaxin prn, baclofen 10mg   tid               -wrote rx for percocet 7.5/325 q8 prn.    -We will initiate an opioid monitoring program, this consists of regular clinic visits, examinations, urine drug screen, pill counts as well as use of [place] Controlled Substance Reporting System. [place].    5. Gait abnormality             Cont cane for safety.  Thirty minutes of face  to face patient care time were spent during this visit. All questions were encouraged and answered. Follow up with NP in about one month.

## 2017-04-04 ENCOUNTER — Emergency Department (HOSPITAL_COMMUNITY)
Admission: EM | Admit: 2017-04-04 | Discharge: 2017-04-04 | Disposition: A | Discharge status: Home / Self Care | Payer: Medicaid Other | Attending: Emergency Medicine | Admitting: Emergency Medicine

## 2017-04-04 ENCOUNTER — Ambulatory Visit: Payer: Medicaid Other | Attending: Physical Medicine & Rehabilitation | Admitting: *Deleted

## 2017-04-04 ENCOUNTER — Encounter (HOSPITAL_COMMUNITY): Payer: Self-pay

## 2017-04-04 ENCOUNTER — Emergency Department (HOSPITAL_BASED_OUTPATIENT_CLINIC_OR_DEPARTMENT_OTHER)
Admit: 2017-04-04 | Discharge: 2017-04-04 | Disposition: A | Discharge status: Home / Self Care | Payer: Medicaid Other | Attending: Emergency Medicine | Admitting: Emergency Medicine

## 2017-04-04 ENCOUNTER — Encounter: Payer: Self-pay | Admitting: *Deleted

## 2017-04-04 ENCOUNTER — Emergency Department (HOSPITAL_COMMUNITY): Payer: Medicaid Other

## 2017-04-04 DIAGNOSIS — Z955 Presence of coronary angioplasty implant and graft: Secondary | ICD-10-CM | POA: Insufficient documentation

## 2017-04-04 DIAGNOSIS — M79601 Pain in right arm: Secondary | ICD-10-CM | POA: Insufficient documentation

## 2017-04-04 DIAGNOSIS — M6281 Muscle weakness (generalized): Secondary | ICD-10-CM | POA: Insufficient documentation

## 2017-04-04 DIAGNOSIS — I82402 Acute embolism and thrombosis of unspecified deep veins of left lower extremity: Secondary | ICD-10-CM

## 2017-04-04 DIAGNOSIS — E119 Type 2 diabetes mellitus without complications: Secondary | ICD-10-CM | POA: Insufficient documentation

## 2017-04-04 DIAGNOSIS — M79609 Pain in unspecified limb: Secondary | ICD-10-CM | POA: Diagnosis not present

## 2017-04-04 DIAGNOSIS — R29818 Other symptoms and signs involving the nervous system: Secondary | ICD-10-CM | POA: Insufficient documentation

## 2017-04-04 DIAGNOSIS — Z79899 Other long term (current) drug therapy: Secondary | ICD-10-CM | POA: Insufficient documentation

## 2017-04-04 DIAGNOSIS — I10 Essential (primary) hypertension: Secondary | ICD-10-CM | POA: Diagnosis not present

## 2017-04-04 DIAGNOSIS — M25462 Effusion, left knee: Secondary | ICD-10-CM | POA: Diagnosis not present

## 2017-04-04 DIAGNOSIS — I2510 Atherosclerotic heart disease of native coronary artery without angina pectoris: Secondary | ICD-10-CM | POA: Insufficient documentation

## 2017-04-04 DIAGNOSIS — I252 Old myocardial infarction: Secondary | ICD-10-CM | POA: Diagnosis not present

## 2017-04-04 DIAGNOSIS — M7989 Other specified soft tissue disorders: Secondary | ICD-10-CM | POA: Diagnosis present

## 2017-04-04 DIAGNOSIS — Z8673 Personal history of transient ischemic attack (TIA), and cerebral infarction without residual deficits: Secondary | ICD-10-CM | POA: Insufficient documentation

## 2017-04-04 DIAGNOSIS — M79602 Pain in left arm: Secondary | ICD-10-CM | POA: Diagnosis present

## 2017-04-04 DIAGNOSIS — R278 Other lack of coordination: Secondary | ICD-10-CM | POA: Insufficient documentation

## 2017-04-04 MED ORDER — HYDROMORPHONE HCL 1 MG/ML IJ SOLN
1.0000 mg | Freq: Once | INTRAMUSCULAR | Status: AC
  Administered 2017-04-04: 1 mg via INTRAMUSCULAR
  Filled 2017-04-04: qty 1

## 2017-04-04 MED ORDER — ENOXAPARIN SODIUM 100 MG/ML ~~LOC~~ SOLN
1.0000 mg/kg | Freq: Once | SUBCUTANEOUS | Status: AC
  Administered 2017-04-04: 85 mg via SUBCUTANEOUS
  Filled 2017-04-04 (×2): qty 1

## 2017-04-04 MED ORDER — MORPHINE SULFATE (PF) 4 MG/ML IV SOLN
4.0000 mg | Freq: Once | INTRAVENOUS | Status: AC
  Administered 2017-04-04: 4 mg via INTRAMUSCULAR
  Filled 2017-04-04: qty 1

## 2017-04-04 MED ORDER — ENOXAPARIN SODIUM 80 MG/0.8ML ~~LOC~~ SOLN: 80.0000 mg | Freq: Two times a day (BID) | SUBCUTANEOUS | 0 refills | Status: DC

## 2017-04-04 NOTE — Therapy (Signed)
[facility] [address] [place], [place], [address] Phone: [phone]   Fax:  [phone]  Occupational Therapy Evaluation  Patient Details  Name: Russell Carter MRN: [id number] Date of Birth: 1956-09-12 Referring Provider: Dr Maryann Conners  Encounter Date: 04/04/2017      OT End of Session - 04/04/17 1144    Visit Number 1   Number of Visits 4  MCD requesting Eval + 3 Additional Visits   Date for OT Re-Evaluation 06/20/17   Authorization Type Pt has [place] [facility]/Medicaid [place] Access. Requesting approval for Eval plus 3 additional treatment sessions over next 12 weeks. Check MCD approval prior to next visit   OT Start Time 1030   OT Stop Time 1128   OT Time Calculation (min) 58 min   Activity Tolerance Patient tolerated treatment well;No increased pain   Behavior During Therapy WFL for tasks assessed/performed      Past Medical History:  Diagnosis Date  . Anginal pain (HCC)   . Arthritis   . Blood in stool   . CAD (coronary artery disease)    a. 2011: cath showing mod non-obstructive disease b. 02/2016: cath with 95% stenosis in the Mid RCA. DES placed.  . Cervical stenosis of spine   . Cocaine abuse    history  . Depression   . Diabetes mellitus without complication (HCC)   . Gout, unspecified   . Headache   . HTN (hypertension)   . Hypercholesterolemia   . Hyperglycemia   . Migraines   . Myocardial infarction [facility]) 2008   drug cocaine and marijuana at time of mi none  since    . Pneumonia   . Renal insufficiency   . Stroke [facility])     Past Surgical History:  Procedure Laterality Date  . ANTERIOR CERVICAL DECOMP/DISCECTOMY FUSION N/A 01/24/2017   Procedure: Cervical Four-Five,Cervical Five Six,Cervical Six-Seven Anterior cervical discectomy with fusion and plate with Cervical Three-Thoracic- One Laminectomy/Foraminotomy with Cervical Three-Thoracic- One Fixation and fusion;  Surgeon: Loura Halt  Ditty, MD;  Location: [facility] OR;  Service: Neurosurgery;  Laterality: N/A;  Part-1 anterior approach  . CARDIAC CATHETERIZATION  2008; ~ 2011   [place]-40%lad  . CARDIAC CATHETERIZATION N/A 03/16/2016   Procedure: Left Heart Cath and Coronary Angiography;  Surgeon: Marykay Lex, MD;  Location: [facility] INVASIVE CV LAB;  Service: Cardiovascular;  Laterality: N/A;  . CORONARY ANGIOPLASTY     STENTS  . EYE SURGERY Bilateral    "for double vision"  . INGUINAL HERNIA REPAIR  05/12/2012   Procedure: HERNIA REPAIR INGUINAL ADULT;  Surgeon: Liz Malady, MD;  Location: [facility];  Service: General;  Laterality: Right;  . INGUINAL HERNIA REPAIR Left 02/02/2003   Dr. Violeta Gelinas. repair with mesh. Same procedure done again in  05/22/2004   . INGUINAL HERNIA REPAIR Left ?date 2nd OR  . POSTERIOR CERVICAL FUSION/FORAMINOTOMY N/A 01/24/2017   Procedure: Cervical Three-Thoracic One-Laminectomy/Foraminotomy with Cervical Three-Thoracic One Fixation and fusion;  Surgeon: Loura Halt Ditty, MD;  Location: [facility] OR;  Service: Neurosurgery;  Laterality: N/A;  Part-2 Posterior approach  . RECONSTRUCT / STABILIZE DISTAL ULNA    . SHOULDER OPEN ROTATOR CUFF REPAIR Left 2010  . SHOULDER SURGERY Left    "injured on the job; had to cut small bone out"  . THUMB FUSION Right     There were no vitals filed for this visit.      Subjective Assessment - 04/04/17 1218    Subjective  Pt is a 61 y/o male s/p C4-C7 ACDF and C3-T1 laminectomy on 01/24/17. He presents today per Dr Riley Kill for OT Eval and Treat secondary to cervial stenosis and meylopathy. He was in hospital ~5 weeks ago secondary to DVT/PE and now takes xarelto.   Pertinent History See EPIC PMH for complete history: Cervical collar/Cervical precautions; 01/24/17 C4-C7 ACDF & C3-T1 laminectomy. DVT/PE & was hospitialized on 03/15/17-03/19/17; Spondylosis; radiculopathy; MI 2008; cardiac Cath 2008, 2011 and 03/16/17; Eye surgery for "Double Vision" wears  glasses to assist HTN; DM; Arthritis; ETOH; Past Drug Abuse; L RCR, Migraine; renal insufficency;Right thumb fusion; 2010 Left Rotator Cuff Repair; 2010 Left resconstruction/stabalize Distal Ulna.   Limitations Requires assistance for ADL's including bathing, dressing, toileting and meal prep and household activities   Patient Stated Goals "Get some movement back in my left arm and be able to use to complete necessary functions to live"   Currently in Pain? Yes   Pain Score 9    Pain Location --  Spine goes down L shoulder/hand & LLE as well   Pain Orientation Left   Pain Descriptors / Indicators Burning;Sharp;Tingling   Pain Type Chronic pain   Pain Radiating Towards Down spine to LLE and Left fingertips   Pain Onset More than a month ago  Prior to 01/24/17   Pain Frequency Constant   Aggravating Factors  Activity   Pain Relieving Factors Laying flat on his back and muscle rub assist with pain   Effect of Pain on Daily Activities Impaired, limits activity due to pain   Multiple Pain Sites Yes  See above           [facility] OT Assessment - 04/04/17 0001      Assessment   Diagnosis Cervical Stenosis and Myelopathy   Referring Provider Dr Maryann Conners   Onset Date 01/24/17  DOS:01/24/17; Pain since 1988   Prior Therapy In-pt Rehab   01/24/17-02/12/17; DVT/PE 03/15/17-03/19/17     Precautions   Precautions Cervical  Per chart review Dr Bevely Palmer   Precaution Comments Does not currently have functional use LUE   Required Braces or Orthoses Cervical Brace     Restrictions   Weight Bearing Restrictions Yes   Other Position/Activity Restrictions --  No lifting     Balance Screen   Has the patient fallen in the past 6 months No   Has the patient had a decrease in activity level because of a fear of falling?  No   Is the patient reluctant to leave their home because of a fear of falling?  No     Home  Environment   Family/patient expects to be discharged to: Private residence   Lives With  Family  90y/o mother and brother     Prior Function   Vocation Retired   Leisure Shoot basketball and swim  Currently unable to do either     ADL   Eating/Feeding Independent   Grooming Shaving  Cousin Assists - pt unable   Grooming Patient Percentage 80%  Does all grooming except shaving, has't tried Neurosurgeon   Upper Body Bathing Moderate assistance   Upper Body Bathing: Patient Percentage 50%   Lower Body Bathing Minimal assistance   Lower Body Bathing Patient Percentage 50%   Upper Body Dressing Minimal assistance   Lower Body Dressing Modified independent   Toilet Transfer Modified independent   Toileting - Clothing Manipulation Modified independent  Avoids buttons, snaps and zippers   Toileting -  Hygiene Modified Independent  Tub/Shower Transfer Minimal assistance  Uses shower chair with back & brother assists   ADL comments Brother and Mother assist with all ADL PRN per pt report. Mother is 90y/o     IADL   Shopping --  Takes bus, does own shopping   Light Housekeeping Does personal laundry completely  [employer] and does own laundry   Meal Prep Needs to have meals prepared and served   Medication Management Is responsible for taking medication in correct dosages at correct time   Development worker, community financial matters independently (budgets, writes checks, pays rent, bills goes to [employer]), collects and keeps track of income     Mobility   Mobility Status Needs assist   Mobility Status Comments Uses SPC for functiona lmobility. Reports RLE numbness radiating down leg from spine.     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   Additional Comments Wears special glasses due to double vision since birth per pt report     Vision Assessment   Vision Assessment Vision not tested   Comment See aove     Activity  Tolerance   Activity Tolerance Comments Overall decreased activity tolerance due to pain in spine that radiates down L leg and arm. Pain is constant     Cognition   Overall Cognitive Status Within Functional Limits for tasks assessed     Observation/Other Assessments   Observations Pt is wearing cervical collar per precautions by Dr Bevely Palmer. He has a f/u appointment on 04/14/17; Pt plans to call Dr Bevely Palmer office later today due to increased spine pain that is radiating down his left leg   Other Surveys  Select   Quick DASH  Function = 75% Impaired      Sensation   Light Touch Appears Intact  Pt able to indicate LT bilateral hands/c/o paresthesias   Additional Comments Pt reports paresthesias bilateral hands with pt indicating that "The right hand is worse than the left."     Coordination   Gross Motor Movements are Fluid and Coordinated No   Fine Motor Movements are Fluid and Coordinated No   Coordination and Movement Description Coordination and movement is impaired overall. L > impairement than right   9 Hole Peg Test Right;Left   Right 9 Hole Peg Test 49.16 seconds   Left 9 Hole Peg Test Unable with left hand   Coordination Significantly impaired L > R     Edema   Edema L hand edema noted as compared to right visually  Pt has done edema massage since last Dr Riley Kill visit LUE     ROM / Strength   AROM / PROM / Strength AROM;Strength     Strength   Overall Strength Deficits   Overall Strength Comments Limited in AROM RUE ~0-30* shoulder flexion with upper trap hiking; AROM R elbow for flexion/extension WFL's (end range flexion is slightly impaired); LUE A/ROM of digits only, AROM for left elbow = -2/5 overall; A/ROM for left shoulder shrugs only.     Hand Function   Right Hand Grip (lbs) 60   Left Hand Grip (lbs) 24   Comment Pt can grip with left hand if he places objects/JAMAR in left hand, cannot independently pick up. Is Independent with release of objects bilateral hands.      Functional Reaching Activities  Low Level L UE unable/RUE able w/ compensatory patterns   Mid Level Unable bilaterally   High Level Unalbe bilaterally                         OT Education - 04/04/17 1142    Education provided Yes   Education Details OT Assessment findings and recommendations; Pt encouraged to call Dr Bevely Palmer office to f/u [id number] pain in spine with pain radiating down LUE and LLE over last several days per pt report.   Person(s) Educated Patient   Methods Explanation;Demonstration   Comprehension Verbalized understanding          OT Short Term Goals - 04/04/17 1221      OT SHORT TERM GOAL #1   Title Pt will independently state 2-3 possible a/e options to assist with increased ADL independence   Baseline Dependent   Time 6   Period Weeks   Status New     OT SHORT TERM GOAL #2   Title Pt will be Mod I LUE edema management techniques   Baseline VC/TC's required   Time 6   Period Weeks   Status New           OT Long Term Goals - 04/04/17 1222      OT LONG TERM GOAL #1   Title Pt will be Mod I 1 handed UE dressing and bathing techniques to assist with increased independence with ADL's   Baseline Min-Mod assistance from family/Brither, cousin helps him shave   Time 12   Period Weeks   Status New     OT LONG TERM GOAL #2   Title Pt will be Mod I bilateral UE HEP to assist with increased functional use    Baseline Requires Min-Mod Assist   Time 12   Period Weeks   Status New     OT LONG TERM GOAL #3   Title Pt will demonstrate improved function bilateral UE' sas seen by improved Quick DASH Score of 50% or more   Baseline 04/04/17 = 75% impaired   Time 12   Period Weeks   Status New               Plan - 04/04/17 1147    Clinical Impression Statement Pt is a 61y/o RHD male with a complicated PMH (to include but is not limited to: multiple MI; DM; recent cervical ACDF and laminectomy that was complicated by DVT/PE,  Arthritis, L RCR, migraines, HTN, ETOH, past drug abuse, renal insufficiency, "double vision" since birth/has had surgery, spondylosis and radiculopathy), currently presenting to out-pt OT per Dr Riley Kill for Eval/Treatment w/ Dx: cervical stenosis and myleopathy (672.89; G99.2), DOS for ACDF and laminectomy was 01/24/17. He requires assistance since his surgery on 01/24/17, from his brother and 7 y/o Mother for most ADL and home making tasks. Pt has Medicaid for insurance and it was explained to him that this may be a limiting factor for out-pt therapy. He has very limited use of his RUE and his LUE has significant deficits as well. He should benefit from out-pt OT to address possible A/E recommendations, edema control LUE, issue home program and one handed dressing techniques to assist with increased independence with ADL's.     Occupational performance deficits (Please refer to evaluation for details): ADL's;IADL's;Leisure   Rehab Potential Fair   Current Impairments/barriers affecting progress: Due to date of surgery, limited A/ROM, Pain and significant PMH   OT Frequency --  Eval,  plus + 3 visits over next 12 weeks    OT Duration 12 weeks  Awaiting MCD Authorization   OT Treatment/Interventions Self-care/ADL training;Therapeutic exercise;Therapeutic exercises;Patient/family education;DME and/or AE instruction;Therapeutic activities   Plan Check MCD Authorization; Check ADL's and make recommendations for A/E to assist with increased Independence w/ ADL's. Consider 1 handed dressing techiques following visit.   Clinical Decision Making Multiple treatment options, significant modification of task necessary   Recommended Other Services Pt to f/u with Dr Bevely Palmer Re: Cont use of cervical collar and increased c/o pain over last several days, in spine that is radiating down LLE and LUE.   Consulted and Agree with Plan of Care Patient      Patient will benefit from skilled therapeutic intervention in order to  improve the following deficits and impairments:  Decreased knowledge of use of DME, Increased edema, Pain, Decreased coordination, Decreased mobility, Impaired sensation, Decreased activity tolerance, Decreased endurance, Decreased range of motion, Decreased strength, Impaired UE functional use, Difficulty walking  Visit Diagnosis: Muscle weakness (generalized) - Plan: Ot plan of care cert/re-cert  Other lack of coordination - Plan: Ot plan of care cert/re-cert  Pain in left arm - Plan: Ot plan of care cert/re-cert  Pain in right arm - Plan: Ot plan of care cert/re-cert  Other symptoms and signs involving the nervous system - Plan: Ot plan of care cert/re-cert    Problem List Patient Active Problem List   Diagnosis Date Noted  . Pulmonary embolism (HCC) 02/14/2017  . Debility   . Post-operative pain   . Radicular pain   . Diabetes mellitus type 2 in nonobese (HCC)   . Surgery, elective   . Postoperative pain   . Leukocytosis   . Thrombocytopenia (HCC)   . Acute blood loss anemia   . History of myocardial infarction   . Cocaine abuse   . Depression   . Type 2 diabetes mellitus with hyperglycemia, without long-term current use of insulin (HCC)   . Stage 3 chronic kidney disease   . Benign essential HTN   . History of CVA (cerebrovascular accident)   . Prediabetes   . Cervical spondylosis with radiculopathy 01/24/2017  . Dyslipidemia 03/15/2016  . Unstable angina (HCC) - chest pain with complete heart block & chest pain 03/15/2016  . Complete heart block (HCC) 03/14/2016  . Cervical spinal stenosis 03/14/2016  . Chest pain with moderate risk of acute coronary syndrome 03/13/2016  . CAD-50% LAD 2011 12/14/2014  . Gout 11/16/2014  . Headache 07/15/2013  . Polysubstance abuse 01/15/2013  . DIZZINESS 06/05/2010  . Essential hypertension 05/18/2009  . Acute myocardial infarction (HCC) 05/18/2009  . MUSCLE SPASM 05/18/2009    Alm Bustard, OTR/L 04/04/2017,  12:39 PM  [facility] [address] [place], [place], [address] Phone: [phone]   Fax:  [phone]  Name: Russell Carter MRN: [id number] Date of Birth: 09/13/1956

## 2017-04-04 NOTE — ED Provider Notes (Signed)
[facility]-EMERGENCY DEPT Provider Note   CSN: [id number] Arrival date & time: 04/04/17  1558     History   Chief Complaint Chief Complaint  Patient presents with  . Leg Pain    HPI Russell Carter is a 61 y.o. male.  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 Patient reports proximal 92 weeks ago while he was walking up stairs he felt a pop in his left knee which resulted in his knee giving out. Denies any fall. He notes since that injury he has had constant gradually worsening pain and swelling to the front of his left knee. Pain is worse with movement. He reports associated tingling to his left lower leg. He states he has been taking his home prescription of 7.5 mg oxycodone without relief. Patient denies any other recent fall, trauma or injury. Denies fever, redness, warmth, chest pain, shortness of breath, abdominal pain, Swelling or tenderness, numbness, weakness. Patient reports he has been ambulating at home using a cane. He reports he has been compliant with his prescription of Xarelto and denies missing any doses.  Past Medical History:  Diagnosis Date  . Anginal pain (HCC)   . Arthritis   . Blood in stool   . CAD (coronary artery disease)    a. 2011: cath showing mod non-obstructive disease b. 02/2016: cath with 95% stenosis in the Mid RCA. DES placed.  . Cervical stenosis of spine   . Cocaine abuse    history  . Depression   . Diabetes mellitus without complication (HCC)   . Gout, unspecified   . Headache   . HTN (hypertension)   . Hypercholesterolemia   . Hyperglycemia   . Migraines   . Myocardial infarction [facility]) 2008   drug cocaine and marijuana at time of mi none  since    . Pneumonia   . Renal insufficiency   . Stroke [facility])      Patient Active Problem List   Diagnosis Date Noted  . Pulmonary embolism (HCC) 02/14/2017  . Debility   . Post-operative pain   . Radicular pain   . Diabetes mellitus type 2 in nonobese (HCC)   . Surgery, elective   . Postoperative pain   . Leukocytosis   . Thrombocytopenia (HCC)   . Acute blood loss anemia   . History of myocardial infarction   . Cocaine abuse   . Depression   . Type 2 diabetes mellitus with hyperglycemia, without long-term current use of insulin (HCC)   . Stage 3 chronic kidney disease   . Benign essential HTN   . History of CVA (cerebrovascular accident)   . Prediabetes   . Cervical spondylosis with radiculopathy 01/24/2017  . Dyslipidemia 03/15/2016  . Unstable angina (HCC) - chest pain with complete heart block & chest pain 03/15/2016  . Complete heart block (HCC) 03/14/2016  . Cervical spinal stenosis 03/14/2016  . Chest pain with moderate risk of acute coronary syndrome 03/13/2016  . CAD-50% LAD 2011 12/14/2014  . Gout 11/16/2014  . Headache 07/15/2013  . Polysubstance abuse 01/15/2013  . DIZZINESS 06/05/2010  . Essential hypertension 05/18/2009  . Acute myocardial infarction (HCC) 05/18/2009  . MUSCLE SPASM 05/18/2009    Past Surgical History:  Procedure Laterality Date  . ANTERIOR CERVICAL  DECOMP/DISCECTOMY FUSION N/A 01/24/2017   Procedure: Cervical Four-Five,Cervical Five Six,Cervical Six-Seven Anterior cervical discectomy with fusion and plate with Cervical Three-Thoracic- One Laminectomy/Foraminotomy with Cervical Three-Thoracic- One Fixation and fusion;  Surgeon: Loura Halt Ditty, MD;  Location: [facility] OR;  Service: Neurosurgery;  Laterality: N/A;  Part-1 anterior approach  . CARDIAC CATHETERIZATION  2008; ~ 2011   [place]-40%lad  . CARDIAC CATHETERIZATION N/A 03/16/2016   Procedure: Left Heart Cath and Coronary Angiography;  Surgeon: Marykay Lex, MD;  Location: [facility] INVASIVE CV LAB;  Service: Cardiovascular;  Laterality: N/A;  .  CORONARY ANGIOPLASTY     STENTS  . EYE SURGERY Bilateral    "for double vision"  . INGUINAL HERNIA REPAIR  05/12/2012   Procedure: HERNIA REPAIR INGUINAL ADULT;  Surgeon: Liz Malady, MD;  Location: [facility];  Service: General;  Laterality: Right;  . INGUINAL HERNIA REPAIR Left 02/02/2003   Dr. Violeta Gelinas. repair with mesh. Same procedure done again in  05/22/2004   . INGUINAL HERNIA REPAIR Left ?date 2nd OR  . POSTERIOR CERVICAL FUSION/FORAMINOTOMY N/A 01/24/2017   Procedure: Cervical Three-Thoracic One-Laminectomy/Foraminotomy with Cervical Three-Thoracic One Fixation and fusion;  Surgeon: Loura Halt Ditty, MD;  Location: [facility] OR;  Service: Neurosurgery;  Laterality: N/A;  Part-2 Posterior approach  . RECONSTRUCT / STABILIZE DISTAL ULNA    . SHOULDER OPEN ROTATOR CUFF REPAIR Left 2010  . SHOULDER SURGERY Left    "injured on the job; had to cut small bone out"  . THUMB FUSION Right        Home Medications    Prior to Admission medications   Medication Sig Start Date End Date Taking? Authorizing Provider  allopurinol (ZYLOPRIM) 300 MG tablet Take 1 tablet (300 mg total) by mouth daily. 03/17/16   Strader, Lennart Pall, PA-C  amLODipine (NORVASC) 5 MG tablet Take 1 tablet (5 mg total) by mouth daily. Patient taking differently: Take 5 mg by mouth every evening.  01/18/17   Wendall Stade, MD  baclofen (LIORESAL) 10 MG tablet Take 1 tablet (10 mg total) by mouth 3 (three) times daily. 02/12/17   Angiulli, Mcarthur Rossetti, PA-C  CALCIUM-VITAMIN D PO Take 1 tablet by mouth daily.    [provider]  clopidogrel (PLAVIX) 75 MG tablet Take 1 tablet (75 mg total) by mouth daily. 02/18/17   Deneise Lever, MD  docusate sodium (COLACE) 100 MG capsule Take 1 capsule (100 mg total) by mouth 2 (two) times daily. 02/12/17   Angiulli, Mcarthur Rossetti, PA-C  enoxaparin (LOVENOX) 80 MG/0.8ML injection Inject 0.8 mLs (80 mg total) into the skin every 12 (twelve) hours. 04/04/17   Barrett Henle, PA-C  gabapentin (NEURONTIN) 400 MG capsule Take 2 capsules (800 mg total) by mouth 4 (four) times daily. 03/25/17   Ranelle Oyster, MD  hydrochlorothiazide (HYDRODIURIL) 25 MG tablet Take 1 tablet (25 mg total) by mouth daily. 03/29/16   Leone Brand, NP  losartan (COZAAR) 100 MG tablet Take 100 mg by mouth every morning.  01/04/17   Wendall Stade, MD  methocarbamol (ROBAXIN) 750 MG tablet Take 1 tablet (750 mg total) by mouth every 6 (six) hours as needed for muscle spasms. 02/12/17   Angiulli, Mcarthur Rossetti, PA-C  Multiple Vitamin (MULTIVITAMIN WITH MINERALS) TABS tablet Take 1 tablet by mouth daily.    [provider]  nitroGLYCERIN (NITROSTAT) 0.4 MG SL tablet Place 1 tablet (0.4 mg total) under the tongue every 5 (five) minutes as needed for chest  pain. 03/29/16   Leone Brand, NP  oxyCODONE-acetaminophen (PERCOCET) 7.5-325 MG tablet Take 1 tablet by mouth every 8 (eight) hours as needed. 03/25/17   Ranelle Oyster, MD  pantoprazole (PROTONIX) 40 MG tablet Take 1 tablet (40 mg total) by mouth at bedtime. 02/12/17   Angiulli, Mcarthur Rossetti, PA-C  rosuvastatin (CRESTOR) 40 MG tablet Take 1 tablet (40 mg total) by mouth daily. 01/04/17 04/04/17  Wendall Stade, MD    Family History Family History  Problem Relation Age of Onset  . Heart attack Mother        x7. Still alive  . Heart attack Father        deceased b/c of MI  . Heart attack Brother        older, deceased b/c of MI     Social History Social History  Substance Use Topics  . Smoking status: Never Smoker  . Smokeless tobacco: Never Used  . Alcohol use No     Allergies   Shellfish allergy   Review of Systems Review of Systems  Musculoskeletal: Positive for arthralgias (left knee) and joint swelling.  Neurological:       Tingling  All other systems reviewed and are negative.    Physical Exam Updated Vital Signs BP (!) 155/93   Pulse 99   Temp 98.7 F (37.1 C) (Oral)   Resp 15   Ht 5\' 11"   (1.803 m)   Wt 85.7 kg (189 lb)   SpO2 100%   BMI 26.36 kg/m   Physical Exam  Constitutional: He is oriented to person, place, and time. He appears well-developed and well-nourished.  HENT:  Head: Normocephalic and atraumatic.  Eyes: Conjunctivae and EOM are normal. Right eye exhibits no discharge. Left eye exhibits no discharge. No scleral icterus.  Neck:  C-collar in place (reports hx spinal surgery in April and chronic neck pain)  Cardiovascular: Normal rate, regular rhythm, normal heart sounds and intact distal pulses.   Pulmonary/Chest: Effort normal and breath sounds normal. No respiratory distress. He has no wheezes. He has no rales. He exhibits no tenderness.  Abdominal: Soft. He exhibits no distension. There is no tenderness.  Musculoskeletal: He exhibits tenderness. He exhibits no edema or deformity.       Left knee: He exhibits decreased range of motion (due to pain and swelling), swelling and effusion. He exhibits no ecchymosis, no deformity, no laceration, no erythema and normal patellar mobility. Tenderness (anterior) found.       Left ankle: Normal. He exhibits normal range of motion, no swelling, no ecchymosis, no deformity, no laceration and normal pulse. No tenderness. Achilles tendon normal.       Left upper leg: Normal.       Left lower leg: Normal. He exhibits no tenderness, no bony tenderness, no swelling, no edema, no deformity and no laceration.       Left foot: Normal.  Moderate swelling with effusion present to left knee. No erythema or warmth. No rash present.  Exquisite TTP over left anterior knee with light palpation, no tenderness over medial/lateral/posterior knee. Pt unable to move knee from resting in extension position due to reported pain. FROM of left foot, ankle and hip. No calf swelling or tenderness. Sensation grossly intact. 2+ DP pulse.   Neurological: He is alert and oriented to person, place, and time.  Skin: Skin is warm and dry.  Nursing note and  vitals reviewed.    ED Treatments / Results  Labs (all labs ordered  are listed, but only abnormal results are displayed) Labs Reviewed - No data to display  EKG  EKG Interpretation None       Radiology Dg Knee Complete 4 Views Left  Result Date: 04/04/2017 CLINICAL DATA:  History of blood clots in legs and lungs. Left knee pain and swelling. Dyspnea while walking to hospital. EXAM: LEFT KNEE - COMPLETE 4+ VIEW COMPARISON:  12/05/2011 FINDINGS: Slight degenerative joint space narrowing of the femorotibial compartment with tibial spine spurring and minimal spurring off the medial tibial plateau. A normal variant bipartite patella is noted involving the upper outer quadrant of the patella with chronic stable degenerative change as well across the patellofemoral compartment. Small suprapatellar joint effusion is identified. There is mild soft tissue swelling on the included distal thigh and proximal leg and more so along the anteromedial aspect of the knee. IMPRESSION: Nonspecific soft tissue swelling about the left knee more so anteromedially. Small joint effusion with degenerative joint space narrowing of the femorotibial and patellofemoral compartments compartment. Electronically Signed   By: Tollie Eth M.D.   On: 04/04/2017 19:17    Procedures Procedures (including critical care time)  Medications Ordered in ED Medications  morphine 4 MG/ML injection 4 mg (4 mg Intramuscular Given 04/04/17 1903)  HYDROmorphone (DILAUDID) injection 1 mg (1 mg Intramuscular Given 04/04/17 2030)  enoxaparin (LOVENOX) injection 85 mg (85 mg Subcutaneous Given 04/04/17 2143)     Initial Impression / Assessment and Plan / ED Course  I have reviewed the triage vital signs and the nursing notes.  Pertinent labs & imaging results that were available during my care of the patient were reviewed by me and considered in my medical decision making (see chart for details).    Pt presents with left knee pain and  swelling that started 2 weeks ago while he was walking up steps. Reports feeling a pop in his left knee and then having it give out on him. Sxs have worsening over past 2 weeks. 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 Pt reports being compliant with Xarelto and denies missing any doses. VSS. Exam showed moderate swelling and TTP over left anterior knee with small effusion. Knee exam limited due to swelling and tenderness. No calf swelling or tenderness. LLE neurovascularly intact. Pt given pain meds.  Chart review shows patient was admitted on 02/14/17 for DVT/PE. Patient was found to have bilateral lower extremity DVT with left upper extremity superficial thrombosis and PE s/p cervical laminectomy. Pt was d/c home on Plavix and Xarelto.   Doppler US ordered in triage showed acute DVT involving posterior tibial veins of LLE. No evidence of superficial vein thrombosis or Baker's cyst. Left knee xray showed soft tissue swelling anteromedially, small joint effusion with degenerative joint space narrowing. On reevaluation, pt continues to report being compliant with anticoagulant therapy. Denies any CP or SOB while in the ED. Evaluation is not concerning for PE at this time, pt has remained hemodynamically stable while in the ED. Discussed pt with Dr. Juleen China. Due to pt failing anticoagulation tx with Xarelto, plan to start pt on Lovenox for new DVT. Additionally, suspect pt with possible ligament injury of left knee. Knee placed in knee immobilizer and pt given crutches. Advised to f/u with ortho next week for reevaluation and further management of suspected ligament injury. Pt reports having rx of 7.5mg  oxycodone at home for his chronic neck  and back pain, advised his to continued taking it as prescribed. Plan to have pt follow up with PCP for reevaluation and further management of DVT.  Discussed strict return precautions.   Final Clinical Impressions(s) / ED Diagnoses   Final diagnoses:  Acute deep vein thrombosis (DVT) of left lower extremity, unspecified vein (HCC)  Effusion of left knee    New Prescriptions Discharge Medication List as of 04/04/2017  9:47 PM    START taking these medications   Details  enoxaparin (LOVENOX) 80 MG/0.8ML injection Inject 0.8 mLs (80 mg total) into the skin every 12 (twelve) hours., Starting Thu 04/04/2017, Print         Barrett Henle, PA-C 04/04/17 2322    Raeford Razor, MD 04/12/17 1128

## 2017-04-04 NOTE — Progress Notes (Signed)
Orthopedic Tech Progress Note Patient Details:  Russell Carter May 03, 1956 [id number]  Ortho Devices Type of Ortho Device: Knee Immobilizer, Crutches Ortho Device/Splint Location: applied knee immobilizer and crutches to pt left leg/knee.  pt tolerated application of knee immobilizer and pt ambulated well with crutches despite weakness on left side.  Left knee/leg. Ortho Device/Splint Interventions: Application, Adjustment   Alvina Chou 04/04/2017, 8:10 PM

## 2017-04-04 NOTE — ED Triage Notes (Signed)
Pt endorses left knee pain/swelling. Knee noted to be swollen. Pt stated that he got shob walking over to the hospital. Pt has hx of blood clots in his legs and lungs. 99% on RA, tachy at 108. Breath sounds clear.

## 2017-04-04 NOTE — Discharge Instructions (Signed)
Take your medication as prescribed. Continue taking your home medications as prescribed as needed for pain relief. Continue wearing your knee immobilizer and using your crutches until you follow up with the orthopedic clinic listed below next week. I suspect he will need follow-up evaluation and further imaging due to concern for possible ligament injury. Continue to rest, elevate and apply ice to her knee for 15-20 minutes 3-4 times daily to help with pain and swelling. Follow-up with your primary care provider within the next 1-2 weeks for follow up evaluation and further management of your left leg DVT. Please return to the Emergency Department if symptoms worsen or new onset of fever, redness, swelling, numbness, weakness, chest pain, difficulty breathing.

## 2017-04-04 NOTE — ED Notes (Signed)
Patient transported to X-ray 

## 2017-04-04 NOTE — Progress Notes (Signed)
**  Preliminary report by tech**  Left lower extremity venous duplex complete. There is evidence of acute deep vein thrombosis involving the posterior tibial veins of the left lower extremity. There is no evidence of superficial vein thrombosis involving the left lower extremity. There is no evidence of a Baker's cyst on the left. Results were given to the patient's nurse, Marylene Land.   04/04/17 5:48 PM Olen Cordial RVT

## 2017-04-12 ENCOUNTER — Other Ambulatory Visit: Payer: Self-pay | Admitting: Student

## 2017-04-12 NOTE — Telephone Encounter (Signed)
Pt's pharmacy requesting a refill on Allopurinol 300 mg tablet. Would you like to refill this medication? Please advise

## 2017-04-17 ENCOUNTER — Ambulatory Visit: Payer: Medicaid Other | Attending: Internal Medicine | Admitting: Physician Assistant

## 2017-04-17 VITALS — BP 131/79 | HR 83 | Temp 98.3°F | Resp 16 | Wt 184.0 lb

## 2017-04-17 DIAGNOSIS — Z955 Presence of coronary angioplasty implant and graft: Secondary | ICD-10-CM | POA: Diagnosis not present

## 2017-04-17 DIAGNOSIS — Z79899 Other long term (current) drug therapy: Secondary | ICD-10-CM | POA: Insufficient documentation

## 2017-04-17 DIAGNOSIS — M5412 Radiculopathy, cervical region: Secondary | ICD-10-CM | POA: Insufficient documentation

## 2017-04-17 DIAGNOSIS — F1911 Other psychoactive substance abuse, in remission: Secondary | ICD-10-CM | POA: Diagnosis not present

## 2017-04-17 DIAGNOSIS — Z7902 Long term (current) use of antithrombotics/antiplatelets: Secondary | ICD-10-CM | POA: Diagnosis not present

## 2017-04-17 DIAGNOSIS — N183 Chronic kidney disease, stage 3 (moderate): Secondary | ICD-10-CM | POA: Diagnosis not present

## 2017-04-17 DIAGNOSIS — I2510 Atherosclerotic heart disease of native coronary artery without angina pectoris: Secondary | ICD-10-CM | POA: Diagnosis not present

## 2017-04-17 DIAGNOSIS — E1122 Type 2 diabetes mellitus with diabetic chronic kidney disease: Secondary | ICD-10-CM | POA: Diagnosis not present

## 2017-04-17 DIAGNOSIS — I252 Old myocardial infarction: Secondary | ICD-10-CM | POA: Diagnosis not present

## 2017-04-17 DIAGNOSIS — M25462 Effusion, left knee: Secondary | ICD-10-CM | POA: Insufficient documentation

## 2017-04-17 DIAGNOSIS — M1712 Unilateral primary osteoarthritis, left knee: Secondary | ICD-10-CM | POA: Diagnosis not present

## 2017-04-17 DIAGNOSIS — I129 Hypertensive chronic kidney disease with stage 1 through stage 4 chronic kidney disease, or unspecified chronic kidney disease: Secondary | ICD-10-CM | POA: Diagnosis not present

## 2017-04-17 DIAGNOSIS — I82403 Acute embolism and thrombosis of unspecified deep veins of lower extremity, bilateral: Secondary | ICD-10-CM

## 2017-04-17 DIAGNOSIS — I2699 Other pulmonary embolism without acute cor pulmonale: Secondary | ICD-10-CM | POA: Diagnosis present

## 2017-04-17 DIAGNOSIS — F329 Major depressive disorder, single episode, unspecified: Secondary | ICD-10-CM | POA: Insufficient documentation

## 2017-04-17 DIAGNOSIS — E7800 Pure hypercholesterolemia, unspecified: Secondary | ICD-10-CM | POA: Diagnosis not present

## 2017-04-17 DIAGNOSIS — Z91013 Allergy to seafood: Secondary | ICD-10-CM | POA: Diagnosis not present

## 2017-04-17 MED ORDER — WARFARIN SODIUM 5 MG PO TABS: 5.0000 mg | ORAL_TABLET | Freq: Every day | ORAL | 3 refills | Status: DC

## 2017-04-17 NOTE — Patient Instructions (Signed)
-  Continue Lovenox 80 mg under years can twice daily until told by [place] to discontinue -No more Xarelto. STOP! -Pickup your Coumadin prescription on Friday (5 mg each evening). Once daily med.  -come here on Tuesday to have bloodwork

## 2017-04-17 NOTE — Progress Notes (Signed)
Russell Carter  ZOX:[id number]  WJX:[id number]  DOB - December 15, 1955  Chief Complaint  Patient presents with  . Follow-up  . Hospitalization Follow-up       Subjective:   Russell Carter is a 61 y.o. male here today for establishment of care. He has a past medical history of hypertension, polysubstance abuse, diabetes mellitus type 2, coronary artery disease status post PCI of the right coronary artery in 2017, hyperlipidemia, TIA, transient complete heart block resolved after PCI of the RCA, cervical stenosis and chronic kidney disease. He has had multiple hospitalizations/ED visits this year.  01/24/2017 through 01/28/2017 and neurosurgical admission for  ANTERIOR CERVICAL DECOMPRESSION/DISCECTOMY FUSION 3 LEVELS   POSTERIOR CERVICAL FUSION/FORAMINOTOMY LEVEL 5   By Dr. Bevely Palmer.  Postoperatively he went for inpatient rehabilitation from 04/09/20180-02/12/2017.   02/14/17-A couple days later presented to the emergency department on 02/14/2017 with left chest pain, dyspnea and shortness of breath. He was diagnosed with a left pulmonary emboli and bilateral DVT. He also had left arm phlebitis. An echocardiogram showed no evidence of cor pulmonale. He was placed on Xarelto.  02/22/2017 he went to the emergency department with a nosebleed. Uncomplicated.  03/12/2017 he went to the emergency department with abdominal pain and anorexia. No acute findings. No admission.  04/04/2017 he went to the emergency room with left knee pain and swelling. He felt like he had heard a pop 2 weeks prior. X-rays and ultrasound showed small area of joint effusion of the left with DJD. He is referred to orthopedics. An ultrasound of the left lower extremity showed that the clot was still there. He basically was diagnosed with failed Xarelto therapy and started on Lovenox shots. He is receiving 80 mg twice daily. Administering to himself. No problems at the injection site. Still has issues with the left knee but his  left lower leg is doing better. It sounds to me like he is taking Lovenox and Xarelto. Hoping to strop the injections.   ROS: GEN: denies fever or chills, denies change in weight Skin: denies lesions or rashes HEENT: denies headache, earache, epistaxis, sore throat, +neck pain LUNGS: denies SHOB, dyspnea, PND, orthopnea CV: denies CP or palpitations ABD: denies abd pain, N or V EXT: denies muscle spasms or swelling; + pain in left lower ext, no weakness NEURO: denies numbness or tingling, denies sz, stroke or TIA   ALLERGIES: Allergies  Allergen Reactions  . Shellfish Allergy Anaphylaxis    PAST MEDICAL HISTORY: Past Medical History:  Diagnosis Date  . Anginal pain (HCC)   . Arthritis   . Blood in stool   . CAD (coronary artery disease)    a. 2011: cath showing mod non-obstructive disease b. 02/2016: cath with 95% stenosis in the Mid RCA. DES placed.  . Cervical stenosis of spine   . Cocaine abuse    history  . Depression   . Diabetes mellitus without complication (HCC)   . Gout, unspecified   . Headache   . HTN (hypertension)   . Hypercholesterolemia   . Hyperglycemia   . Migraines   . Myocardial infarction [facility]) 2008   drug cocaine and marijuana at time of mi none  since    . Pneumonia   . Renal insufficiency   . Stroke [facility])     PAST SURGICAL HISTORY: Past Surgical History:  Procedure Laterality Date  . ANTERIOR CERVICAL DECOMP/DISCECTOMY FUSION N/A 01/24/2017   Procedure: Cervical Four-Five,Cervical Five Six,Cervical Six-Seven Anterior cervical discectomy with fusion and plate with Cervical Three-Thoracic-  One Laminectomy/Foraminotomy with Cervical Three-Thoracic- One Fixation and fusion;  Surgeon: Loura Halt Ditty, MD;  Location: [facility] OR;  Service: Neurosurgery;  Laterality: N/A;  Part-1 anterior approach  . CARDIAC CATHETERIZATION  2008; ~ 2011   [place]-40%lad  . CARDIAC CATHETERIZATION N/A 03/16/2016   Procedure: Left Heart Cath and Coronary Angiography;   Surgeon: Marykay Lex, MD;  Location: [facility] INVASIVE CV LAB;  Service: Cardiovascular;  Laterality: N/A;  . CORONARY ANGIOPLASTY     STENTS  . EYE SURGERY Bilateral    "for double vision"  . INGUINAL HERNIA REPAIR  05/12/2012   Procedure: HERNIA REPAIR INGUINAL ADULT;  Surgeon: Liz Malady, MD;  Location: [facility];  Service: General;  Laterality: Right;  . INGUINAL HERNIA REPAIR Left 02/02/2003   Dr. Violeta Gelinas. repair with mesh. Same procedure done again in  05/22/2004   . INGUINAL HERNIA REPAIR Left ?date 2nd OR  . POSTERIOR CERVICAL FUSION/FORAMINOTOMY N/A 01/24/2017   Procedure: Cervical Three-Thoracic One-Laminectomy/Foraminotomy with Cervical Three-Thoracic One Fixation and fusion;  Surgeon: Loura Halt Ditty, MD;  Location: [facility] OR;  Service: Neurosurgery;  Laterality: N/A;  Part-2 Posterior approach  . RECONSTRUCT / STABILIZE DISTAL ULNA    . SHOULDER OPEN ROTATOR CUFF REPAIR Left 2010  . SHOULDER SURGERY Left    "injured on the job; had to cut small bone out"  . THUMB FUSION Right     MEDICATIONS AT HOME: Prior to Admission medications   Medication Sig Start Date End Date Taking? Authorizing Provider  allopurinol (ZYLOPRIM) 300 MG tablet TAKE ONE TABLET BY MOUTH ONCE DAILY 04/12/17  Yes Wendall Stade, MD  amLODipine (NORVASC) 5 MG tablet Take 1 tablet (5 mg total) by mouth daily. Patient taking differently: Take 5 mg by mouth every evening.  01/18/17  Yes Wendall Stade, MD  clopidogrel (PLAVIX) 75 MG tablet Take 1 tablet (75 mg total) by mouth daily. 02/18/17  Yes Deneise Lever, MD  docusate sodium (COLACE) 100 MG capsule Take 1 capsule (100 mg total) by mouth 2 (two) times daily. 02/12/17  Yes Angiulli, Mcarthur Rossetti, PA-C  enoxaparin (LOVENOX) 80 MG/0.8ML injection Inject 0.8 mLs (80 mg total) into the skin every 12 (twelve) hours. 04/04/17  Yes Barrett Henle, PA-C  gabapentin (NEURONTIN) 400 MG capsule Take 2 capsules (800 mg total) by mouth 4 (four)  times daily. 03/25/17  Yes Ranelle Oyster, MD  hydrochlorothiazide (HYDRODIURIL) 25 MG tablet Take 1 tablet (25 mg total) by mouth daily. 03/29/16  Yes Leone Brand, NP  losartan (COZAAR) 100 MG tablet Take 100 mg by mouth every morning.  01/04/17  Yes Wendall Stade, MD  methocarbamol (ROBAXIN) 750 MG tablet Take 1 tablet (750 mg total) by mouth every 6 (six) hours as needed for muscle spasms. 02/12/17  Yes Angiulli, Mcarthur Rossetti, PA-C  nitroGLYCERIN (NITROSTAT) 0.4 MG SL tablet Place 1 tablet (0.4 mg total) under the tongue every 5 (five) minutes as needed for chest pain. 03/29/16  Yes Leone Brand, NP  oxyCODONE-acetaminophen (PERCOCET) 7.5-325 MG tablet Take 1 tablet by mouth every 8 (eight) hours as needed. 03/25/17  Yes Ranelle Oyster, MD  pantoprazole (PROTONIX) 40 MG tablet Take 1 tablet (40 mg total) by mouth at bedtime. 02/12/17  Yes Angiulli, Mcarthur Rossetti, PA-C  rosuvastatin (CRESTOR) 40 MG tablet Take 1 tablet (40 mg total) by mouth daily. 01/04/17 04/17/17 Yes Wendall Stade, MD  baclofen (LIORESAL) 10 MG tablet Take 1 tablet (10 mg total) by  mouth 3 (three) times daily. Patient not taking: Reported on 04/17/2017 02/12/17   Charlton Amor, PA-C  CALCIUM-VITAMIN D PO Take 1 tablet by mouth daily.    [provider]  Multiple Vitamin (MULTIVITAMIN WITH MINERALS) TABS tablet Take 1 tablet by mouth daily.    [provider]    Family History  Problem Relation Age of Onset  . Heart attack Mother        x7. Still alive  . Heart attack Father        deceased b/c of MI  . Heart attack Brother        older, deceased b/c of MI    Social-lives in [place] with mom and brother. Nonsmoker. No illicit drugs. Occasional beer. Unemployed. Gets Medicaid.   Objective:   Vitals:   04/17/17 0944  BP: 131/79  Pulse: 83  Resp: 16  Temp: 98.3 F (36.8 C)  TempSrc: Oral  SpO2: 98%  Weight: 184 lb (83.5 kg)    Exam General appearance : Awake, alert, not in any distress. Speech  Clear. Not toxic looking HEENT: Atraumatic and Normocephalic, pupils equally reactive to light and accomodation Neck: cervical collar in place Chest:Good air entry bilaterally, no added sounds  CVS: S1 S2 regular, no murmurs.  Abdomen: Bowel sounds present, Non tender and not distended with no guarding, rigidity or rebound. Extremities: B/L Lower Ext shows no edema, both legs are warm to touch; knee immobilizer for the left Neurology: Awake alert, and oriented X 3, CN II-XII intact, Non focal Skin:No Rash Wounds:N/A  Data Review Lab Results  Component Value Date   HGBA1C 6.2 (H) 01/21/2017   HGBA1C 5.9 (H) 03/13/2016   HGBA1C (H) 01/21/2011    5.9 (NOTE)                                                                       According to the ADA Clinical Practice Recommendations for 2011, when HbA1c is used as a screening test:   >=6.5%   Diagnostic of Diabetes Mellitus           (if abnormal result  is confirmed)  5.7-6.4%   Increased risk of developing Diabetes Mellitus  References:Diagnosis and Classification of Diabetes Mellitus,Diabetes Care,2011,34(Suppl 1):S62-S69 and Standards of Medical Care in         Diabetes - 2011,Diabetes Care,2011,34  (Suppl 1):S11-S61.     Assessment & Plan  1. PE/Bilateral DVT  -DC Xarelto  -Cont Loveno 80 mg sub Q BID with Coumadin 5 mg daily (pt can't pick up until Fri)  -PT/INR on Tuesday with PharmD 2. Left knee effusion/OA  -Ortho consult pending  -knee immobilizer  -prn pain meds 3. Hx cervical radiculopathy, postop  -keep appt with Neurosurgery  -prn pain meds  -cervical collar per neurosurgery 4. CAD-stable  -not on BB 2/2 hx transient CHB  -not on ASA 2/2 Plavix and Anticoagulant  -cont statin/ARB 5. CKD stage 3-stable 6. HTN-stable 7. Hx polysubstance abuse-in remission   Return in about 7 days (around 04/24/2017).  The patient was given clear instructions to go to ER or return to medical center if symptoms don't improve, worsen  or new problems develop. The patient verbalized understanding. The patient was told to call  to get lab results if they haven't heard anything in the next week.   Total time spent with patient was 29 min. Greater than 50 % of this visit was spent face to face counseling and coordinating care regarding risk factor modification, compliance importance and encouragement, education related to risk factor modification.  This note has been created with Education officer, environmental. Any transcriptional errors are unintentional.    Scot Jun, PA-C [facility] [place], [place] [phone]   04/17/2017, 10:17 AM

## 2017-04-18 ENCOUNTER — Ambulatory Visit: Payer: Medicaid Other | Admitting: Occupational Therapy

## 2017-04-23 ENCOUNTER — Ambulatory Visit: Payer: Medicaid Other | Attending: Internal Medicine

## 2017-04-23 ENCOUNTER — Ambulatory Visit: Payer: Medicaid Other | Admitting: Pharmacist

## 2017-04-23 DIAGNOSIS — Z7901 Long term (current) use of anticoagulants: Secondary | ICD-10-CM | POA: Insufficient documentation

## 2017-04-23 DIAGNOSIS — Z86711 Personal history of pulmonary embolism: Secondary | ICD-10-CM | POA: Diagnosis not present

## 2017-04-23 DIAGNOSIS — I2782 Chronic pulmonary embolism: Secondary | ICD-10-CM

## 2017-04-23 DIAGNOSIS — Z5181 Encounter for therapeutic drug level monitoring: Secondary | ICD-10-CM | POA: Insufficient documentation

## 2017-04-23 LAB — POCT INR: INR: 1.4

## 2017-04-23 NOTE — Progress Notes (Signed)
Patient here for inr

## 2017-04-23 NOTE — Progress Notes (Signed)
    Pharmacy Anticoagulation Clinic  Subjective: Patient presents today for INR monitoring. Anticoagulation indication is PE.   Current dose of warfarin: 5 mg daily. Currently bridging with Lovenox  Adherence to warfarin: denies any missed doses Signs/symptoms of bleeding: denies Recent changes in diet: denies Recent changes in medications: denies Upcoming procedures that may impact anticoagulation: denies  Reports some bruising with Lovenox but is ok with the injections.   Objective: Today's INR = 1.4  Lab Results  Component Value Date   INR 1.16 02/14/2017   INR 1.12 01/25/2017   INR 1.12 01/21/2017     Assessment and Plan: Anticoagulation: Patient is Subtherapeutic based on patient's INR of 1.4 and patient's INR goal of 2-3. Will Increase current warfarin dose: 5 mg daily but 7.5 mg today and tomorrow with INR check on Thursday. Will get 2 therapeutic INRs and then stop the Lovenox. Patient to continue Lovenox for now.  Patient verbalized understanding and was provided with written instructions. Next INR check planned for in 2 days.

## 2017-04-25 ENCOUNTER — Emergency Department (HOSPITAL_COMMUNITY): Payer: Medicaid Other

## 2017-04-25 ENCOUNTER — Ambulatory Visit (HOSPITAL_BASED_OUTPATIENT_CLINIC_OR_DEPARTMENT_OTHER): Payer: Medicaid Other | Admitting: *Deleted

## 2017-04-25 ENCOUNTER — Inpatient Hospital Stay (HOSPITAL_COMMUNITY)
Admission: EM | Admit: 2017-04-25 | Discharge: 2017-04-28 | DRG: 378 | Disposition: A | Discharge status: Home / Self Care | Payer: Medicaid Other | Attending: Internal Medicine | Admitting: Internal Medicine

## 2017-04-25 ENCOUNTER — Encounter (HOSPITAL_COMMUNITY): Payer: Self-pay

## 2017-04-25 ENCOUNTER — Telehealth: Payer: Self-pay | Admitting: *Deleted

## 2017-04-25 DIAGNOSIS — Z79899 Other long term (current) drug therapy: Secondary | ICD-10-CM | POA: Diagnosis not present

## 2017-04-25 DIAGNOSIS — R791 Abnormal coagulation profile: Secondary | ICD-10-CM | POA: Diagnosis present

## 2017-04-25 DIAGNOSIS — K921 Melena: Secondary | ICD-10-CM | POA: Diagnosis present

## 2017-04-25 DIAGNOSIS — I2782 Chronic pulmonary embolism: Secondary | ICD-10-CM

## 2017-04-25 DIAGNOSIS — M109 Gout, unspecified: Secondary | ICD-10-CM | POA: Diagnosis present

## 2017-04-25 DIAGNOSIS — Z7901 Long term (current) use of anticoagulants: Secondary | ICD-10-CM | POA: Diagnosis not present

## 2017-04-25 DIAGNOSIS — Z955 Presence of coronary angioplasty implant and graft: Secondary | ICD-10-CM

## 2017-04-25 DIAGNOSIS — E785 Hyperlipidemia, unspecified: Secondary | ICD-10-CM | POA: Diagnosis present

## 2017-04-25 DIAGNOSIS — G8929 Other chronic pain: Secondary | ICD-10-CM | POA: Diagnosis present

## 2017-04-25 DIAGNOSIS — Z91013 Allergy to seafood: Secondary | ICD-10-CM | POA: Diagnosis not present

## 2017-04-25 DIAGNOSIS — N189 Chronic kidney disease, unspecified: Secondary | ICD-10-CM | POA: Diagnosis present

## 2017-04-25 DIAGNOSIS — E119 Type 2 diabetes mellitus without complications: Secondary | ICD-10-CM

## 2017-04-25 DIAGNOSIS — E1122 Type 2 diabetes mellitus with diabetic chronic kidney disease: Secondary | ICD-10-CM | POA: Diagnosis present

## 2017-04-25 DIAGNOSIS — R5383 Other fatigue: Secondary | ICD-10-CM

## 2017-04-25 DIAGNOSIS — Z8249 Family history of ischemic heart disease and other diseases of the circulatory system: Secondary | ICD-10-CM

## 2017-04-25 DIAGNOSIS — I252 Old myocardial infarction: Secondary | ICD-10-CM

## 2017-04-25 DIAGNOSIS — G8918 Other acute postprocedural pain: Secondary | ICD-10-CM

## 2017-04-25 DIAGNOSIS — I10 Essential (primary) hypertension: Secondary | ICD-10-CM | POA: Diagnosis present

## 2017-04-25 DIAGNOSIS — D62 Acute posthemorrhagic anemia: Secondary | ICD-10-CM

## 2017-04-25 DIAGNOSIS — Z86711 Personal history of pulmonary embolism: Secondary | ICD-10-CM

## 2017-04-25 DIAGNOSIS — I129 Hypertensive chronic kidney disease with stage 1 through stage 4 chronic kidney disease, or unspecified chronic kidney disease: Secondary | ICD-10-CM | POA: Diagnosis present

## 2017-04-25 DIAGNOSIS — Z9861 Coronary angioplasty status: Secondary | ICD-10-CM | POA: Diagnosis not present

## 2017-04-25 DIAGNOSIS — R569 Unspecified convulsions: Secondary | ICD-10-CM | POA: Diagnosis present

## 2017-04-25 DIAGNOSIS — Z8673 Personal history of transient ischemic attack (TIA), and cerebral infarction without residual deficits: Secondary | ICD-10-CM | POA: Diagnosis not present

## 2017-04-25 DIAGNOSIS — Z86718 Personal history of other venous thrombosis and embolism: Secondary | ICD-10-CM

## 2017-04-25 DIAGNOSIS — D500 Iron deficiency anemia secondary to blood loss (chronic): Secondary | ICD-10-CM | POA: Diagnosis present

## 2017-04-25 DIAGNOSIS — Z981 Arthrodesis status: Secondary | ICD-10-CM

## 2017-04-25 DIAGNOSIS — W19XXXA Unspecified fall, initial encounter: Secondary | ICD-10-CM | POA: Diagnosis present

## 2017-04-25 DIAGNOSIS — I2510 Atherosclerotic heart disease of native coronary artery without angina pectoris: Secondary | ICD-10-CM | POA: Diagnosis present

## 2017-04-25 DIAGNOSIS — E1165 Type 2 diabetes mellitus with hyperglycemia: Secondary | ICD-10-CM | POA: Diagnosis present

## 2017-04-25 LAB — COMPREHENSIVE METABOLIC PANEL
ALK PHOS: 40 U/L (ref 38–126)
ALT: 19 U/L (ref 17–63)
AST: 13 U/L — AB (ref 15–41)
Albumin: 3.1 g/dL — ABNORMAL LOW (ref 3.5–5.0)
Anion gap: 6 (ref 5–15)
BUN: 18 mg/dL (ref 6–20)
CHLORIDE: 109 mmol/L (ref 101–111)
CO2: 25 mmol/L (ref 22–32)
CREATININE: 0.99 mg/dL (ref 0.61–1.24)
Calcium: 8.8 mg/dL — ABNORMAL LOW (ref 8.9–10.3)
GFR calc Af Amer: >60 mL/min (ref >60)
Glucose, Bld: 100 mg/dL — ABNORMAL HIGH (ref 65–99)
Potassium: 3.6 mmol/L (ref 3.5–5.1)
Sodium: 140 mmol/L (ref 135–145)
Total Bilirubin: 0.4 mg/dL (ref 0.3–1.2)
Total Protein: 5.5 g/dL — ABNORMAL LOW (ref 6.5–8.1)

## 2017-04-25 LAB — URINALYSIS, ROUTINE W REFLEX MICROSCOPIC
BILIRUBIN URINE: NEGATIVE
Glucose, UA: NEGATIVE mg/dL
HGB URINE DIPSTICK: NEGATIVE
KETONES UR: NEGATIVE mg/dL
Leukocytes, UA: NEGATIVE
Nitrite: NEGATIVE
PH: 6 (ref 5.0–8.0)
Protein, ur: NEGATIVE mg/dL
SPECIFIC GRAVITY, URINE: 1.014 (ref 1.005–1.030)

## 2017-04-25 LAB — CBC WITH DIFFERENTIAL/PLATELET
BASOS ABS: 0 10*3/uL (ref 0.0–0.1)
Basophils Relative: 0 %
EOS PCT: 1 %
Eosinophils Absolute: 0 10*3/uL (ref 0.0–0.7)
HEMATOCRIT: 21.9 % — AB (ref 39.0–52.0)
HEMOGLOBIN: 6.6 g/dL — AB (ref 13.0–17.0)
LYMPHS ABS: 1.6 10*3/uL (ref 0.7–4.0)
LYMPHS PCT: 40 %
MCH: 26.6 pg (ref 26.0–34.0)
MCHC: 30.1 g/dL (ref 30.0–36.0)
MCV: 88.3 fL (ref 78.0–100.0)
Monocytes Absolute: 0.2 10*3/uL (ref 0.1–1.0)
Monocytes Relative: 6 %
NEUTROS ABS: 2.1 10*3/uL (ref 1.7–7.7)
NEUTROS PCT: 53 %
PLATELETS: 268 10*3/uL (ref 150–400)
RBC: 2.48 MIL/uL — AB (ref 4.22–5.81)
RDW: 19.6 % — ABNORMAL HIGH (ref 11.5–15.5)
WBC: 4 10*3/uL (ref 4.0–10.5)

## 2017-04-25 LAB — POC OCCULT BLOOD, ED: Fecal Occult Bld: POSITIVE — AB

## 2017-04-25 LAB — POCT INR

## 2017-04-25 LAB — GLUCOSE, POCT (MANUAL RESULT ENTRY): POC GLUCOSE: 162 mg/dL — AB (ref 70–99)

## 2017-04-25 LAB — PREPARE RBC (CROSSMATCH)

## 2017-04-25 MED ORDER — SODIUM CHLORIDE 0.9% FLUSH: 3.0000 mL | INTRAVENOUS | Status: DC | PRN

## 2017-04-25 MED ORDER — GABAPENTIN 400 MG PO CAPS
800.0000 mg | ORAL_CAPSULE | Freq: Four times a day (QID) | ORAL | Status: DC
  Administered 2017-04-25 – 2017-04-28 (×11): 800 mg via ORAL
  Filled 2017-04-25 (×11): qty 2

## 2017-04-25 MED ORDER — ALLOPURINOL 300 MG PO TABS
300.0000 mg | ORAL_TABLET | Freq: Every day | ORAL | Status: DC
  Administered 2017-04-26 – 2017-04-28 (×3): 300 mg via ORAL
  Filled 2017-04-25 (×3): qty 1

## 2017-04-25 MED ORDER — SODIUM CHLORIDE 0.9 % IV BOLUS (SEPSIS)
1000.0000 mL | Freq: Once | INTRAVENOUS | Status: AC
  Administered 2017-04-25: 1000 mL via INTRAVENOUS

## 2017-04-25 MED ORDER — SODIUM CHLORIDE 0.9% FLUSH
3.0000 mL | Freq: Two times a day (BID) | INTRAVENOUS | Status: DC
  Administered 2017-04-25 – 2017-04-28 (×3): 3 mL via INTRAVENOUS

## 2017-04-25 MED ORDER — PANTOPRAZOLE SODIUM 40 MG PO TBEC
40.0000 mg | DELAYED_RELEASE_TABLET | Freq: Every day | ORAL | Status: DC
  Administered 2017-04-25 – 2017-04-27 (×3): 40 mg via ORAL
  Filled 2017-04-25 (×3): qty 1

## 2017-04-25 MED ORDER — SODIUM CHLORIDE 0.9 % IV SOLN: 250.0000 mL | INTRAVENOUS | Status: DC | PRN

## 2017-04-25 MED ORDER — SODIUM CHLORIDE 0.9% FLUSH
3.0000 mL | Freq: Two times a day (BID) | INTRAVENOUS | Status: DC
  Administered 2017-04-25 – 2017-04-28 (×4): 3 mL via INTRAVENOUS

## 2017-04-25 MED ORDER — NITROGLYCERIN 0.4 MG SL SUBL: 0.4000 mg | SUBLINGUAL_TABLET | SUBLINGUAL | Status: DC | PRN

## 2017-04-25 MED ORDER — FENTANYL CITRATE (PF) 100 MCG/2ML IJ SOLN
50.0000 ug | Freq: Once | INTRAMUSCULAR | Status: AC
  Administered 2017-04-25: 50 ug via INTRAVENOUS
  Filled 2017-04-25: qty 2

## 2017-04-25 MED ORDER — OXYCODONE-ACETAMINOPHEN 7.5-325 MG PO TABS
1.0000 | ORAL_TABLET | Freq: Three times a day (TID) | ORAL | Status: DC | PRN
  Administered 2017-04-25 – 2017-04-28 (×7): 1 via ORAL
  Filled 2017-04-25 (×7): qty 1

## 2017-04-25 MED ORDER — VITAMIN K1 10 MG/ML IJ SOLN
10.0000 mg | INTRAVENOUS | Status: AC
  Administered 2017-04-25: 10 mg via INTRAVENOUS
  Filled 2017-04-25: qty 1

## 2017-04-25 MED ORDER — SODIUM CHLORIDE 0.9 % IV SOLN: Freq: Once | INTRAVENOUS | Status: DC

## 2017-04-25 MED ORDER — ROSUVASTATIN CALCIUM 20 MG PO TABS
40.0000 mg | ORAL_TABLET | Freq: Every day | ORAL | Status: DC
  Administered 2017-04-25 – 2017-04-27 (×3): 40 mg via ORAL
  Filled 2017-04-25 (×3): qty 2

## 2017-04-25 MED ORDER — EMPTY CONTAINERS FLEXIBLE MISC: 50.0000 [IU]/kg | Status: DC

## 2017-04-25 NOTE — ED Triage Notes (Signed)
Pt arrives [facility] from clinic where he was getting his INR checked Q 2 days as part of clinical trial. Pt had reported INR of 8. Pt also reported having focal seizure like activity at clinic involving bilat arms and right leg. Pt arrives with eye closed but responmds slowly to questions and is oriente x 4.c/o pain at abdomen where shots have been given. C/o abdominal pain when eating or drinking x 10 days. Some tremor noted at right foot while pt answers questions. Steop daughter at bedside

## 2017-04-25 NOTE — H&P (Signed)
Date: 04/25/2017               Patient Name:  Russell Carter MRN: [id number]  DOB: [birth year] Age / Sex: 61 y.o., male   PCP: Patient, No Pcp Per         Medical Service: Internal Medicine Teaching Service         Attending Physician: Dr. Rogelia Boga, Austin Miles, MD    First Contact: Dr. Alinda Money  Pager: [phone]  Second Contact: Dr. Dimple Casey  Pager: [phone]       After Hours (After 5p/  First Contact Pager: [phone]  weekends / holidays): Second Contact Pager: [phone]   Chief Complaint: Weakness, fatigue, and shaking   History of Present Illness: Russell Carter is a 61 y.o. 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 States he was in his usual state of health until today when he went to his PCP office and suddenly felt weak and tremulous. His INR today was >8 (from 1.4 2 days ago 04/23/2017). He is currently being bridged to coumadin with lovenox. He started coumadin 1 week ago (6/29) with an increase in dose 2 days ago 5mg ->7.5mg . He denies bleeding from his nose or gums, but endorses dark, tarry stools and intermittent diarrhea for the past 2 weeks. Denies hematochezia.   Reports he had 2 falls within the past 2 weeks that did not result in any injury. He did not get evaluated in the ED after these falls. He endorses pain in his R neck, R arm, R hip since his surgery in April, as well as diffuse abdominal pain for the past 10 days which he attributes to the  lovenox SQ injections. He has been taking Percocet and Ibuprofen for this pain. Of note, upon review of NCCSRS, patient has been prescribed pain medication from multiple providers since March 2018.   He also complaints of left upper and lower extremity weakness that started after his surgery in April. Patient reports he was told post-op weakness was to  be expected after surgery. He is going to physical therapy for this.   He denies fever, chills, chest pain, SOB, and cough. Reports swelling in his left knee and ankle after twisting it several weeks ago.   In the ED, he was afebrile, normotensive with regular heart rate and satting 100% on room air. He was found to have a Hgb 6.6 (from a baseline of 9-10 in the past 2 months) and a positive FOBT. He was given 1L NS bolus, IV Vitamin K 10mg  x1 and 2u pRBCs. EKG negative.   Meds:  Current Meds  Medication Sig  . allopurinol (ZYLOPRIM) 300 MG tablet TAKE ONE TABLET BY MOUTH ONCE DAILY  . amLODipine (NORVASC) 5 MG tablet Take 1 tablet (5 mg total) by mouth daily. (Patient taking differently: Take 5 mg by mouth every evening. )  . baclofen (LIORESAL) 10 MG tablet Take 1 tablet (10 mg total) by mouth 3 (three) times daily.  Marland Kitchen CALCIUM-VITAMIN D PO Take 1 tablet by mouth daily.  Marland Kitchen enoxaparin (LOVENOX) 80 MG/0.8ML injection Inject 0.8 mLs (80 mg total) into the skin every 12 (twelve) hours.  . gabapentin (NEURONTIN) 400 MG capsule Take 2 capsules (800 mg total) by mouth 4 (four) times daily.  Marland Kitchen losartan (COZAAR) 100 MG tablet Take 100 mg  by mouth at bedtime.   . methocarbamol (ROBAXIN) 750 MG tablet Take 1 tablet (750 mg total) by mouth every 6 (six) hours as needed for muscle spasms.  Marland Kitchen oxyCODONE-acetaminophen (PERCOCET) 7.5-325 MG tablet Take 1 tablet by mouth every 8 (eight) hours as needed.  . pantoprazole (PROTONIX) 40 MG tablet Take 1 tablet (40 mg total) by mouth at bedtime.  . rosuvastatin (CRESTOR) 40 MG tablet Take 1 tablet (40 mg total) by mouth daily. (Patient taking differently: Take 40 mg by mouth at bedtime. )  . warfarin (COUMADIN) 5 MG tablet Take 1 tablet (5 mg total) by mouth daily.     Allergies: Allergies as of 04/25/2017 - Review Complete 04/25/2017  Allergen Reaction Noted  . Shellfish allergy Anaphylaxis 12/04/2011   Past Medical History:  Diagnosis Date  . Anginal pain  (HCC)   . Arthritis   . Blood in stool   . CAD (coronary artery disease)    a. 2011: cath showing mod non-obstructive disease b. 02/2016: cath with 95% stenosis in the Mid RCA. DES placed.  . Cervical stenosis of spine   . Cocaine abuse    history  . Depression   . Diabetes mellitus without complication (HCC)   . Gout, unspecified   . Headache   . HTN (hypertension)   . Hypercholesterolemia   . Hyperglycemia   . Migraines   . Myocardial infarction [facility]) 2008   drug cocaine and marijuana at time of mi none  since    . Pneumonia   . Renal insufficiency   . Stroke [facility])    Past Surgical History:  Procedure Laterality Date  . ANTERIOR CERVICAL DECOMP/DISCECTOMY FUSION N/A 01/24/2017   Procedure: Cervical Four-Five,Cervical Five Six,Cervical Six-Seven Anterior cervical discectomy with fusion and plate with Cervical Three-Thoracic- One Laminectomy/Foraminotomy with Cervical Three-Thoracic- One Fixation and fusion;  Surgeon: Loura Halt Ditty, MD;  Location: [facility] OR;  Service: Neurosurgery;  Laterality: N/A;  Part-1 anterior approach  . CARDIAC CATHETERIZATION  2008; ~ 2011   [place]-40%lad  . CARDIAC CATHETERIZATION N/A 03/16/2016   Procedure: Left Heart Cath and Coronary Angiography;  Surgeon: Marykay Lex, MD;  Location: [facility] INVASIVE CV LAB;  Service: Cardiovascular;  Laterality: N/A;  . CORONARY ANGIOPLASTY     STENTS  . EYE SURGERY Bilateral    "for double vision"  . INGUINAL HERNIA REPAIR  05/12/2012   Procedure: HERNIA REPAIR INGUINAL ADULT;  Surgeon: Liz Malady, MD;  Location: [facility];  Service: General;  Laterality: Right;  . INGUINAL HERNIA REPAIR Left 02/02/2003   Dr. Violeta Gelinas. repair with mesh. Same procedure done again in  05/22/2004   . INGUINAL HERNIA REPAIR Left ?date 2nd OR  . POSTERIOR CERVICAL FUSION/FORAMINOTOMY N/A 01/24/2017   Procedure: Cervical Three-Thoracic One-Laminectomy/Foraminotomy with Cervical Three-Thoracic One Fixation and  fusion;  Surgeon: Loura Halt Ditty, MD;  Location: [facility] OR;  Service: Neurosurgery;  Laterality: N/A;  Part-2 Posterior approach  . RECONSTRUCT / STABILIZE DISTAL ULNA    . SHOULDER OPEN ROTATOR CUFF REPAIR Left 2010  . SHOULDER SURGERY Left    "injured on the job; had to cut small bone out"  . THUMB FUSION Right     Family History:  Family History  Problem Relation Age of Onset  . Heart attack Mother        x7. Still alive  . Heart attack Father        deceased b/c of MI  . Heart attack Brother  older, deceased b/c of MI   . Cancer Brother        bone cancer in 1 brother, ? brain cancer in other brother    Social History:  Social History   Social History  . Marital status: Widowed    Spouse name: N/A  . Number of children: N/A  . Years of education: N/A   Social History Main Topics  . Smoking status: Never Smoker  . Smokeless tobacco: Never Used  . Alcohol use Yes     Comment: 16 oz beer every 2-3 days  . Drug use: No     Comment: 03/14/2016 "quit 2008"  . Sexual activity: Not Currently   Other Topics Concern  . None   Social History Narrative   Lives in [place] with his wife and son.     Review of Systems: A complete ROS was negative except as per HPI.   Physical Exam: Blood pressure 139/88, pulse 80, resp. rate 10, height 5\' 11"  (1.803 m), weight 181 lb (82.1 kg), SpO2 100 %.  General: pleasant, tired-appearing male lying in bed, seemed uncomfortable but in no acute distress HENT: NCAT, neck supple and FROM, OP clear without exudates or erythema, poor dentition  Eyes: pale conjunctiva, anicteric sclera, PERRL Cardiac: regular rate and rhythm, nl S1/S2, no murmurs, rubs or gallops  Pulm: CTAB, no wheezes or crackles, no increased work of breathing  Abd: soft,initally exquisitely tender on light palpation then only mildly tender in all 4 quadrants,  bowel sounds present Neuro: A&Ox3, no focal deficits  Ext: nl capillary refill, warm and well  perfused, no peripheral edema, 2+ DP pulses bilaterally  Derm: surgical scar appreciated in the center of the posterior neck down to the upper back in the cervical region  Psych: attentive, appropriate affect, answers questions appropriately   Labs:  H/H: 6.6/21.9 with MCV 88.3 BMP nl   EKG: personally reviewed my interpretation is sinus rhythm at 86bopm, nl intervals (unable to assess PR interval due to artifact), no axis deviation, no ischemic changes   Head/cervical spine CT 7/5:  1. No acute intracranial process. 2. Mild chronic small vessel ischemic disease.  CT cervical spine: 1. No acute fracture or malalignment. 2. Status post C4 through C7 ACDF and laminectomies, C3 through C7 facet screws.   Assessment & Plan by Problem:  Quanta Roher is a 61 y.o. 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   1. Anemia secondary to acute blood loss: Hx of provoked PE and b/l DVT post-operatively 01/2017. Initially on Xarelto and switched to SQ lovenox due to unresolved DVT on U/S. Started warfarin on 6/29. Presented with fatigue, weakness and 2 weeks of melanotic stools. VSS. H/H 6.6/21.9, INR 8, and positive FOBT. His acute blood loss anemia is likely secondary to submucosal bleeding from GI tract in the setting of a supratherapeutic INR.  - s/p 1L NS bolus  - 2u pRBCS ordered -  H/H post transfusion  - CBC and PT/INR in AM  - On tele  - Continuous pulse ox  - Orthostatics - Continue home PO protonix 40mg  QD   2. CAD: s/p PCI to RCA in 2017. Normal TTE in 01/2017 with EF  65%  3. HTN: On amlodipine 10mg  and losartan 100mg  at home  - Will hold home antihypertensive meds in the setting  of acute blood loss   4. Post-operative pain and weakness:  On baclofen 10mg  TID QD, gabapentin 800mg  QID, percocet 7.5-325mg  q8h PRN, and robaxin 750mg  16h PRN  - Will continue home percocet q8h PRN and gabapentin  - Hold home baclofen and robaxin  - PT/OT consult   5. Type 2 DM: A1c 6.2 on 01/2017 - Diet controlled   6. Gout  - Continue home allopurinol 300mg  QD   7. HLD:   - Continue home rosuvastatin 40mg  QD   IVF: NS @100cc /hr Diet: carb modified  DVT ppx: SCDs  Code status: DNI    Dispo: Admit patient to Inpatient with expected length of stay greater than 2 midnights.  Signed: Burna Cash, MD  Internal Medicine PGY-1  P [phone] 04/25/2017, 8:18 PM

## 2017-04-25 NOTE — ED Notes (Signed)
Dr. Burley Saver informed of hemaglobin

## 2017-04-25 NOTE — Progress Notes (Signed)
    Pharmacy Anticoagulation Clinic  Subjective: Patient presents today for INR monitoring. Anticoagulation indication is PE.   Current dose of warfarin: 5 mg daily, with 7.5 mg on Tuesday and Wednesday.  Adherence to warfarin: patient not able to specify what he took, he just acknowledged that the dose increased  Patient presents to clinic with confusion and trembling.   Objective: Today's INR = >8.0 (of note, needs venous INR to confirm, POCT INR not as accurate >4)  Lab Results  Component Value Date   INR >8 04/25/2017   INR 1.4 04/23/2017   INR 1.16 02/14/2017     Assessment and Plan: Anticoagulation: Patient is Supratherapeutic. Having symptoms of confusion and trembling in clinic but conscious and responding to questions. Nurse called EMS and patient transferred to ED.

## 2017-04-25 NOTE — ED Provider Notes (Addendum)
[facility]-EMERGENCY DEPT Provider Note   CSN: [id number] Arrival date & time: 04/25/17  1554     History   Chief Complaint Chief Complaint  Patient presents with  . Seizures  . Abnormal Lab    HPI Russell Carter is a 61 y.o. male.  HPI  CC: Fatigue  Onset/Duration: 2 days ago Timing: Constant, worsening Severity: Moderate to severe Modifying Factors:  Improved by: Nothing  Worsened by: Activity Associated Signs/Symptoms:  Pertinent (+): Melena for approximately 2-1/2 weeks since Russell Carter started Lovenox.   Pertinent (-): Fevers, chills, nausea, vomiting, chest pain, shortness of breath Context: Patient was recently diagnosed with pulmonary embolism in April and started on Coumadin. Patient was also placed on Lovenox in June.   Patient went to clinic to check his INR with a noted that it was 8. Patient had been feeling weak for 2 days and step daughter stated that before his clinic visit she noted that Russell Carter was very lethargic and also noted that his extremities were shaking. At the clinic they noted patient having extremity shaking and were concerning for possible seizure.   Of note patient did report a fall 2 days ago.  Past Medical History:  Diagnosis Date  . Anginal pain (HCC)   . Arthritis   . Blood in stool   . CAD (coronary artery disease)    a. 2011: cath showing mod non-obstructive disease b. 02/2016: cath with 95% stenosis in the Mid RCA. DES placed.  . Cervical stenosis of spine   . Cocaine abuse    history  . Depression   . Diabetes mellitus without complication (HCC)   . Gout, unspecified   . Headache   . HTN (hypertension)   . Hypercholesterolemia   . Hyperglycemia   . Migraines   . Myocardial infarction [facility]) 2008   drug cocaine and marijuana at time of mi none  since    . Pneumonia   . Renal insufficiency   . Stroke [facility])     Patient Active Problem List   Diagnosis Date Noted  . Pulmonary embolism (HCC) 02/14/2017  . Debility   . Post-operative pain     . Radicular pain   . Diabetes mellitus type 2 in nonobese (HCC)   . Surgery, elective   . Postoperative pain   . Leukocytosis   . Thrombocytopenia (HCC)   . Acute blood loss anemia   . History of myocardial infarction   . Cocaine abuse   . Depression   . Type 2 diabetes mellitus with hyperglycemia, without long-term current use of insulin (HCC)   . Stage 3 chronic kidney disease   . Benign essential HTN   . History of CVA (cerebrovascular accident)   . Prediabetes   . Cervical spondylosis with radiculopathy 01/24/2017  . Dyslipidemia 03/15/2016  . Unstable angina (HCC) - chest pain with complete heart block & chest pain 03/15/2016  . Complete heart block (HCC) 03/14/2016  . Cervical spinal stenosis 03/14/2016  . Chest pain with moderate risk of acute coronary syndrome 03/13/2016  . CAD-50% LAD 2011 12/14/2014  . Gout 11/16/2014  . Headache 07/15/2013  . Polysubstance abuse 01/15/2013  . DIZZINESS 06/05/2010  . Essential hypertension 05/18/2009  . Acute myocardial infarction (HCC) 05/18/2009  . MUSCLE SPASM 05/18/2009    Past Surgical History:  Procedure Laterality Date  . ANTERIOR CERVICAL DECOMP/DISCECTOMY FUSION N/A 01/24/2017   Procedure: Cervical Four-Five,Cervical Five Six,Cervical Six-Seven Anterior cervical discectomy with fusion and plate with Cervical Three-Thoracic- One Laminectomy/Foraminotomy with Cervical Three-Thoracic-  One Fixation and fusion;  Surgeon: Loura Halt Ditty, MD;  Location: [facility] OR;  Service: Neurosurgery;  Laterality: N/A;  Part-1 anterior approach  . CARDIAC CATHETERIZATION  2008; ~ 2011   Howard-40%lad  . CARDIAC CATHETERIZATION N/A 03/16/2016   Procedure: Left Heart Cath and Coronary Angiography;  Surgeon: Marykay Lex, MD;  Location: [facility] INVASIVE CV LAB;  Service: Cardiovascular;  Laterality: N/A;  . CORONARY ANGIOPLASTY     STENTS  . EYE SURGERY Bilateral    "for double vision"  . INGUINAL HERNIA REPAIR  05/12/2012   Procedure: HERNIA  REPAIR INGUINAL ADULT;  Surgeon: Liz Malady, MD;  Location: [facility];  Service: General;  Laterality: Right;  . INGUINAL HERNIA REPAIR Left 02/02/2003   Dr. Violeta Gelinas. repair with mesh. Same procedure done again in  05/22/2004   . INGUINAL HERNIA REPAIR Left ?date 2nd OR  . POSTERIOR CERVICAL FUSION/FORAMINOTOMY N/A 01/24/2017   Procedure: Cervical Three-Thoracic One-Laminectomy/Foraminotomy with Cervical Three-Thoracic One Fixation and fusion;  Surgeon: Loura Halt Ditty, MD;  Location: [facility] OR;  Service: Neurosurgery;  Laterality: N/A;  Part-2 Posterior approach  . RECONSTRUCT / STABILIZE DISTAL ULNA    . SHOULDER OPEN ROTATOR CUFF REPAIR Left 2010  . SHOULDER SURGERY Left    "injured on the job; had to cut small bone out"  . THUMB FUSION Right        Home Medications    Prior to Admission medications   Medication Sig Start Date End Date Taking? Authorizing Provider  allopurinol (ZYLOPRIM) 300 MG tablet TAKE ONE TABLET BY MOUTH ONCE DAILY 04/12/17  Yes Wendall Stade, MD  amLODipine (NORVASC) 5 MG tablet Take 1 tablet (5 mg total) by mouth daily. Patient taking differently: Take 5 mg by mouth every evening.  01/18/17  Yes Wendall Stade, MD  baclofen (LIORESAL) 10 MG tablet Take 1 tablet (10 mg total) by mouth 3 (three) times daily. 02/12/17  Yes Angiulli, Mcarthur Rossetti, PA-C  CALCIUM-VITAMIN D PO Take 1 tablet by mouth daily.   Yes [provider]  enoxaparin (LOVENOX) 80 MG/0.8ML injection Inject 0.8 mLs (80 mg total) into the skin every 12 (twelve) hours. 04/04/17  Yes Barrett Henle, PA-C  gabapentin (NEURONTIN) 400 MG capsule Take 2 capsules (800 mg total) by mouth 4 (four) times daily. 03/25/17  Yes Ranelle Oyster, MD  losartan (COZAAR) 100 MG tablet Take 100 mg by mouth at bedtime.  01/04/17  Yes Wendall Stade, MD  methocarbamol (ROBAXIN) 750 MG tablet Take 1 tablet (750 mg total) by mouth every 6 (six) hours as needed for muscle spasms.  02/12/17  Yes Angiulli, Mcarthur Rossetti, PA-C  oxyCODONE-acetaminophen (PERCOCET) 7.5-325 MG tablet Take 1 tablet by mouth every 8 (eight) hours as needed. 03/25/17  Yes Ranelle Oyster, MD  pantoprazole (PROTONIX) 40 MG tablet Take 1 tablet (40 mg total) by mouth at bedtime. 02/12/17  Yes Angiulli, Mcarthur Rossetti, PA-C  rosuvastatin (CRESTOR) 40 MG tablet Take 1 tablet (40 mg total) by mouth daily. Patient taking differently: Take 40 mg by mouth at bedtime.  01/04/17 04/25/17 Yes Wendall Stade, MD  warfarin (COUMADIN) 5 MG tablet Take 1 tablet (5 mg total) by mouth daily. 04/17/17  Yes Danelle Earthly, Tiffany S, PA-C  clopidogrel (PLAVIX) 75 MG tablet Take 1 tablet (75 mg total) by mouth daily. Patient not taking: Reported on 04/25/2017 02/18/17   Deneise Lever, MD  docusate sodium (COLACE) 100 MG capsule Take 1 capsule (100 mg total)  by mouth 2 (two) times daily. Patient not taking: Reported on 04/25/2017 02/12/17   Angiulli, Mcarthur Rossetti, PA-C  hydrochlorothiazide (HYDRODIURIL) 25 MG tablet Take 1 tablet (25 mg total) by mouth daily. Patient not taking: Reported on 04/25/2017 03/29/16   Leone Brand, NP  nitroGLYCERIN (NITROSTAT) 0.4 MG SL tablet Place 1 tablet (0.4 mg total) under the tongue every 5 (five) minutes as needed for chest pain. 03/29/16   Leone Brand, NP    Family History Family History  Problem Relation Age of Onset  . Heart attack Mother        x7. Still alive  . Heart attack Father        deceased b/c of MI  . Heart attack Brother        older, deceased b/c of MI     Social History Social History  Substance Use Topics  . Smoking status: Never Smoker  . Smokeless tobacco: Never Used  . Alcohol use No     Allergies   Shellfish allergy   Review of Systems Review of Systems All other systems are reviewed and are negative for acute change except as noted in the HPI   Physical Exam Updated Vital Signs BP 129/80   Pulse 81   Resp 16   Ht 5\' 11"  (1.803 m)   Wt 82.1 kg (181 lb)   SpO2 100%    BMI 25.24 kg/m   Physical Exam  Constitutional: Russell Carter is oriented to person, place, and time. Russell Carter appears well-developed and well-nourished. No distress.  HENT:  Head: Normocephalic and atraumatic.  Nose: Nose normal.  Eyes: Conjunctivae and EOM are normal. Pupils are equal, round, and reactive to light. Right eye exhibits no discharge. Left eye exhibits no discharge. No scleral icterus.  Neck: Normal range of motion. Neck supple.  Cardiovascular: Normal rate and regular rhythm.  Exam reveals no gallop and no friction rub.   No murmur heard. Pulmonary/Chest: Effort normal and breath sounds normal. No stridor. No respiratory distress. Russell Carter has no rales.  Abdominal: Soft. Russell Carter exhibits no distension. There is no tenderness. There is guarding. There is no rigidity, no rebound and no CVA tenderness.    Musculoskeletal: Russell Carter exhibits no edema or tenderness.  Neurological: Russell Carter is alert and oriented to person, place, and time.  Skin: Skin is warm and dry. No rash noted. Russell Carter is not diaphoretic. No erythema.  Psychiatric: Russell Carter has a normal mood and affect.  Vitals reviewed.    ED Treatments / Results  Labs (all labs ordered are listed, but only abnormal results are displayed) Labs Reviewed  CBC WITH DIFFERENTIAL/PLATELET - Abnormal; Notable for the following:       Result Value   RBC 2.48 (*)    Hemoglobin 6.6 (*)    HCT 21.9 (*)    RDW 19.6 (*)    All other components within normal limits  COMPREHENSIVE METABOLIC PANEL - Abnormal; Notable for the following:    Glucose, Bld 100 (*)    Calcium 8.8 (*)    Total Protein 5.5 (*)    Albumin 3.1 (*)    AST 13 (*)    All other components within normal limits  URINALYSIS, ROUTINE W REFLEX MICROSCOPIC - Abnormal; Notable for the following:    Color, Urine STRAW (*)    All other components within normal limits  POC OCCULT BLOOD, ED - Abnormal; Notable for the following:    Fecal Occult Bld POSITIVE (*)    All other components within  normal limits    PREPARE RBC (CROSSMATCH)    EKG  EKG Interpretation None       Radiology Ct Head Wo Contrast  Result Date: 04/25/2017 CLINICAL DATA:  Fall, seizure like activity at clinic. Elevated INR. Altered mental status. History of migraines, stroke, angina bloody seen a.m. EXAM: CT HEAD WITHOUT CONTRAST CT CERVICAL SPINE WITHOUT CONTRAST TECHNIQUE: Multidetector CT imaging of the head and cervical spine was performed following the standard protocol without intravenous contrast. Multiplanar CT image reconstructions of the cervical spine were also generated. COMPARISON:  CT HEAD September 12, 2015 and MRI of the cervical spine January 27, 2017 FINDINGS: CT HEAD FINDINGS BRAIN: No intraparenchymal hemorrhage, mass effect nor midline shift. The ventricles and sulci are normal for age. Patchy supratentorial white matter hypodensities . No acute large vascular territory infarcts. No abnormal extra-axial fluid collections. Basal cisterns are patent. VASCULAR: Minimal calcific atherosclerosis of the carotid siphons. SKULL: No skull fracture. No significant scalp soft tissue swelling. SINUSES/ORBITS: Mild paranasal sinus mucosal thickening. Mastoid air cells are well aerated.The included ocular globes and orbital contents are non-suspicious. OTHER: None. CT CERVICAL SPINE FINDINGS ALIGNMENT: Maintained lordosis. Vertebral bodies in alignment. SKULL BASE AND VERTEBRAE: Cervical vertebral bodies are intact. Status post C4 through C7 ACDF, disc prosthesis result in beam hardening artifact. Status post C4 through C7 laminectomies with intact facet screws. RIGHT C3-4 screw traverses the facet joint. Predominately reabsorbed posterior bone graft material without arthrodesis. No destructive bony lesions. SOFT TISSUES AND SPINAL CANAL: Limited assessment due to hardware artifact. Mild calcific atherosclerosis LEFT carotid bifurcation. DISC LEVELS: Limited assessment due to hardware artifact. No canal stenosis. UPPER CHEST: Lung  apices are clear. OTHER: None. IMPRESSION: CTA HEAD: 1. No acute intracranial process. 2. Mild chronic small vessel ischemic disease. CT cervical spine: 1. No acute fracture or malalignment. 2. Status post C4 through C7 ACDF and laminectomies, C3 through C7 facet screws. Electronically Signed   By: Awilda Metro M.D.   On: 04/25/2017 18:03   Ct Cervical Spine Wo Contrast  Result Date: 04/25/2017 CLINICAL DATA:  Fall, seizure like activity at clinic. Elevated INR. Altered mental status. History of migraines, stroke, angina bloody seen a.m. EXAM: CT HEAD WITHOUT CONTRAST CT CERVICAL SPINE WITHOUT CONTRAST TECHNIQUE: Multidetector CT imaging of the head and cervical spine was performed following the standard protocol without intravenous contrast. Multiplanar CT image reconstructions of the cervical spine were also generated. COMPARISON:  CT HEAD September 12, 2015 and MRI of the cervical spine January 27, 2017 FINDINGS: CT HEAD FINDINGS BRAIN: No intraparenchymal hemorrhage, mass effect nor midline shift. The ventricles and sulci are normal for age. Patchy supratentorial white matter hypodensities . No acute large vascular territory infarcts. No abnormal extra-axial fluid collections. Basal cisterns are patent. VASCULAR: Minimal calcific atherosclerosis of the carotid siphons. SKULL: No skull fracture. No significant scalp soft tissue swelling. SINUSES/ORBITS: Mild paranasal sinus mucosal thickening. Mastoid air cells are well aerated.The included ocular globes and orbital contents are non-suspicious. OTHER: None. CT CERVICAL SPINE FINDINGS ALIGNMENT: Maintained lordosis. Vertebral bodies in alignment. SKULL BASE AND VERTEBRAE: Cervical vertebral bodies are intact. Status post C4 through C7 ACDF, disc prosthesis result in beam hardening artifact. Status post C4 through C7 laminectomies with intact facet screws. RIGHT C3-4 screw traverses the facet joint. Predominately reabsorbed posterior bone graft material without  arthrodesis. No destructive bony lesions. SOFT TISSUES AND SPINAL CANAL: Limited assessment due to hardware artifact. Mild calcific atherosclerosis LEFT carotid bifurcation. DISC LEVELS: Limited assessment due to hardware  artifact. No canal stenosis. UPPER CHEST: Lung apices are clear. OTHER: None. IMPRESSION: CTA HEAD: 1. No acute intracranial process. 2. Mild chronic small vessel ischemic disease. CT cervical spine: 1. No acute fracture or malalignment. 2. Status post C4 through C7 ACDF and laminectomies, C3 through C7 facet screws. Electronically Signed   By: Awilda Metro M.D.   On: 04/25/2017 18:03    Procedures Procedures (including critical care time)  CRITICAL CARE Performed by: Amadeo Garnet Cardama Total critical care time: 40 minutes Critical care time was exclusive of separately billable procedures and treating other patients. Critical care was necessary to treat or prevent imminent or life-threatening deterioration. Critical care was time spent personally by me on the following activities: development of treatment plan with patient and/or surrogate as well as nursing, discussions with consultants, evaluation of patient's response to treatment, examination of patient, obtaining history from patient or surrogate, ordering and performing treatments and interventions, ordering and review of laboratory studies, ordering and review of radiographic studies, pulse oximetry and re-evaluation of patient's condition.   Medications Ordered in ED Medications  phytonadione (VITAMIN K) 10 mg in dextrose 5 % 50 mL IVPB (not administered)  0.9 %  sodium chloride infusion (not administered)  fentaNYL (SUBLIMAZE) injection 50 mcg (not administered)  sodium chloride 0.9 % bolus 1,000 mL (0 mLs Intravenous Stopped 04/25/17 1840)     Initial Impression / Assessment and Plan / ED Course  I have reviewed the triage vital signs and the nursing notes.  Pertinent labs & imaging results that were  available during my care of the patient were reviewed by me and considered in my medical decision making (see chart for details).     Workup consistent with acute blood loss anemia in a patient with melena and a supratherapeutic INR. Patient is not actively bleeding at this time. Given his pulmonary emboli, we'll start with vitamin K and transfuse 2 units of packed red blood cells. CT head was unremarkable for acute bleed. Other than the soft tissue abdominal wall tenderness, abdomen is benign so I have low suspicion for internal bleeding. We'll hold off on K Sentra at this time.  Patient will require admission for further workup and management.  His presentation with the shaking is not consistent with seizure-like activity as Russell Carter had shaking of his extremities during my evaluation and maintained his mental status.   Final Clinical Impressions(s) / ED Diagnoses   Final diagnoses:  Acute blood loss anemia  Gastrointestinal hemorrhage with melena  Other fatigue        Cardama, Amadeo Garnet, MD 04/25/17 [id number]

## 2017-04-25 NOTE — Progress Notes (Signed)
Patient Triage Assessment Form  Todays Date: 04/25/2017 @ 1530 Name: Aaris Tozzi DOB: 29-Jan-1956 Reason for walkin: Pt had appointment with Clinical Pharmacist for PT/INR check  When did your symptoms start- Pt states he took all medications and felt bad 30 minutes after taking medications. Pt asked if he has vertigo, he responded that he does but didn't have Meclizine with him to take   Please list symptoms: "can't stop shaking, I feel dizzy and weak"  Are you having pain: yes or no  Assessment Vital Signs: 1534 T:  P: 89  R: 16 SpO2: 100% RA  BP: 118/80         120/79  Neuro: Lethargic, responds to name and stimulation  Pupils equal, round, reactive to light  Grips- left arm- moderate, present.            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   Pt states he is unable to smile or frown. No facial assymmetry assessed.  Pt upper body is weak, unable to support himself. He leans forward and short episode of being unresponsive.     Plan EMS called for transfer to ED.

## 2017-04-26 DIAGNOSIS — D500 Iron deficiency anemia secondary to blood loss (chronic): Secondary | ICD-10-CM

## 2017-04-26 LAB — CBC
HCT: 27.6 % — ABNORMAL LOW (ref 39.0–52.0)
Hemoglobin: 8.5 g/dL — ABNORMAL LOW (ref 13.0–17.0)
MCH: 26.3 pg (ref 26.0–34.0)
MCHC: 30.8 g/dL (ref 30.0–36.0)
MCV: 85.4 fL (ref 78.0–100.0)
PLATELETS: 236 10*3/uL (ref 150–400)
RBC: 3.23 MIL/uL — ABNORMAL LOW (ref 4.22–5.81)
RDW: 17.6 % — AB (ref 11.5–15.5)
WBC: 6.2 10*3/uL (ref 4.0–10.5)

## 2017-04-26 LAB — PROTIME-INR
INR: 1.4
Prothrombin Time: 17.3 seconds — ABNORMAL HIGH (ref 11.4–15.2)

## 2017-04-26 MED ORDER — SODIUM CHLORIDE 0.9 % IV SOLN
INTRAVENOUS | Status: AC
  Administered 2017-04-26: 07:00:00 via INTRAVENOUS

## 2017-04-26 MED ORDER — ACETAMINOPHEN 325 MG PO TABS
650.0000 mg | ORAL_TABLET | Freq: Four times a day (QID) | ORAL | Status: DC | PRN
Start: 2017-04-26 — End: 2017-04-28

## 2017-04-26 NOTE — Progress Notes (Deleted)
Physical Therapy Treatment Patient Details Name: Russell Carter MRN: [id number] DOB: 07-11-1956 Today's Date: 04/26/2017    History of Present Illness Russell Carter is a 61 y.o. 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 He was found to have an INR greater than 8 and a hemoglobin of 6.6. He did have an OT eval at [facility] on 04/04/17, but no subsequent follow up treatments (per Epic).    PT Comments    Pt admitted with/for ABLA with profound weakness. Pt has significantly improved in gait stability/stamina since admission and is mostly interested in improving L UE function.  Pt currently limited functionally due to the problems listed below.  (see problems list.)  Pt will benefit from PT to maximize function and safety to be able to get home safely with available assist .      Follow Up Recommendations  Other (comment) (Outpatient OT   likely more than OPPT financial dependent)     Equipment Recommendations  None recommended by PT    Recommendations for Other Services       Precautions / Restrictions Restrictions Weight Bearing Restrictions: No    Mobility  Bed Mobility Overal bed mobility: Needs Assistance Bed Mobility: Supine to Sit     Supine to sit: Supervision     General bed mobility comments: increased time required   Transfers Overall transfer level: Needs assistance Equipment used: Rolling walker (2 wheeled) (pushed IV pole) Transfers: Sit to/from Stand Sit to Stand: Supervision         General transfer comment: Supervision for safety initially  Ambulation/Gait Ambulation/Gait assistance: Supervision Ambulation Distance (Feet): 500 Feet Assistive device: Straight cane Gait Pattern/deviations: Step-through pattern Gait velocity:  prefers a slow cadence, but can speed up on cue significantly Gait velocity interpretation: at or above normal speed for age/gender General Gait Details: generally steady with L LE starting to mildly circumduct with fatiigue.  Pt maintained control throughout.   Stairs Stairs: Yes   Stair Management: One rail Right;With cane Number of Stairs: 4 General stair comments: safe with AD  Wheelchair Mobility    Modified Rankin (Stroke Patients Only)       Balance Overall balance assessment: Needs assistance Sitting-balance support: No upper extremity supported;Feet supported Sitting balance-Leahy Scale: Good Sitting balance - Comments: sitting EOB and recliner   Standing balance support: Single extremity supported;During functional activity Standing balance-Leahy Scale: Fair Standing balance comment: fair after allowed to get out of room and show he is likely at independent level in homelike environment                            Cognition Arousal/Alertness: Awake/alert Behavior During Therapy: [facility] for tasks assessed/performed (Pt states that he feels depressed since surgery in April) Overall Cognitive Status: Within Functional Limits for tasks assessed                                        Exercises      General Comments General comments (skin integrity, edema, etc.): Pt expressing frustration about LUE and that it is leading him to depression      Pertinent Vitals/Pain Pain Assessment: 0-10 Pain Score: 8  Pain Location: L hand - ulner side - by pinky Pain Descriptors / Indicators: Burning;Grimacing;Heaviness;Sharp;Shooting;Tender Pain Intervention(s): Monitored during session    Home Living Family/patient expects to be discharged to:: Private residence Living Arrangements: Parent;Other relatives (mom; brother) Available Help at Discharge: Family;Available 24 hours/day Type of Home: House Home Access: Stairs to enter Entrance  Stairs-Rails: None Home Layout: One level Home Equipment: Production assistant, radio - single point;Grab bars - tub/shower      Prior Function Level of Independence: Independent with assistive device(s)      Comments: used SPC for ambulation, within the last 2-3 weeks had become independent in ADL including bathing and dressing from sitting   PT Goals (current goals can now be found in the care plan section) Acute Rehab PT Goals Patient Stated Goal: to get LUE back to working right again PT Goal Formulation: With patient Time For Goal Achievement: 05/03/17 Potential to Achieve Goals: Good Additional Goals Additional Goal #2: L UE open/closed chain exercise to supplement OT's functional goals for  Pt's l UE.    Frequency    Min 3X/week      PT Plan      Co-evaluation              AM-PAC PT "6 Clicks" Daily Activity  Outcome Measure  Difficulty turning over in bed (including adjusting bedclothes, sheets and blankets)?: A Little Difficulty moving from lying on back to sitting on the side of the bed? : A Little Difficulty sitting down on and standing up from a chair with arms (e.g., wheelchair, bedside commode, etc,.)?: None Help needed moving to and from a bed to chair (including a wheelchair)?: None Help needed walking in hospital room?: None Help needed climbing 3-5 steps with a railing? : None 6 Click Score: 22    End of Session   Activity Tolerance: Patient tolerated treatment well Patient left: in chair;with call bell/phone within reach Nurse Communication: Mobility status PT Visit Diagnosis: Other abnormalities of gait and mobility (R26.89);Other symptoms and signs involving the nervous system (R29.898);Pain Pain - Right/Left: Left Pain - part of body: Arm;Hand     Time: 1530-1600 PT Time Calculation (min) (ACUTE ONLY): 30 min  Charges:                       G Codes:       05-17-2017  Rutledge Bing, PT [phone]  (pager)   Eliseo Gum  [place] Mi 17-May-2017, 6:21 PM

## 2017-04-26 NOTE — Progress Notes (Signed)
Patient ID: Russell Carter, male   DOB: 06-28-1956, 61 y.o.   MRN: [id number]   Subjective: Russell Carter was seen in his room this morning. He was sitting in his chair next to the window. He states that he is no longer experiencing any shaking and feels better than yesterday. He continues to feel fatigue and weakness. Upon questioning he states he take ibuprofen only once every 4-5 days. He also complains of a constant pulsing pain in his L arm that is different from ongoing pains that he has, as well as a headache.  Objective:  Vital signs in last 24 hours: Vitals:   04/26/17 0231 04/26/17 0257 04/26/17 0535 04/26/17 1440  BP: 110/69 105/68 110/74 114/66  Pulse: 76 68 71 73  Resp: 18 18 18 18   Temp: 98.3 F (36.8 C) 98.4 F (36.9 C) 98.3 F (36.8 C) 97.7 F (36.5 C)  TempSrc: Oral Oral Oral Oral  SpO2: 100% 99% 100% 100%  Weight:      Height:       Physical Exam  Constitutional: He is oriented to person, place, and time. He appears well-developed and well-nourished.  HENT:  Head: Normocephalic and atraumatic.  Eyes: EOM are normal. No scleral icterus.  Cardiovascular: Normal rate, regular rhythm and normal heart sounds.   Pulmonary/Chest: Effort normal and breath sounds normal. No respiratory distress.  Abdominal: Soft. Bowel sounds are normal. He exhibits no distension.  Tenderness to palpation at LUQ  Musculoskeletal: He exhibits no edema or deformity.  Neurological: He is alert and oriented to person, place, and time.  Skin: Skin is warm and dry.   Assessment/Plan:  Russell Carter is a 61 y.o. 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   1. Anemia secondary to acute blood loss:  Hx of provoked PE and b/l DVT post-operatively 01/2017. Initially on Xarelto and switched to SQ lovenox due to unresolved DVT on U/S. Started warfarin on 6/29. Presented with fatigue, weakness and 2 weeks of melanotic stools. VSS. H/H 6.6/21.9, INR 8, and positive FOBT. His acute blood loss anemia is likely secondary to submucosal bleeding from GI tract in the setting of a supratherapeutic INR.  - Received 2u pRBCS - HGB (7/6) 8.5 - PT/INR (7/6) 17.3 / 1.4 - CBC and PT/INR in AM  - On tele  - Continuous pulse ox  - Orthostatics - Continue home PO protonix 40mg  QD  - Colonoscopy as Outpatient  CAD: s/p PCI to RCA in 2017. Normal TTE in 01/2017 with EF 65%  HTN: On amlodipine 10mg  and losartan 100mg  at home  - Will hold home antihypertensive meds in the setting of acute blood loss   Post-operative pain and weakness:  On baclofen 10mg  TID QD, gabapentin 800mg  QID, percocet 7.5-325mg  q8h PRN, and robaxin 750mg  16h PRN  - Will continue home percocet q8h PRN and gabapentin  - Hold home baclofen and robaxin  - PT/OT consult   Type 2 DM: A1c 6.2 on 01/2017 - Diet controlled   Gout  - Continue home allopurinol 300mg  QD   HLD:   - Continue home rosuvastatin 40mg  QD   F/E/N: NS @100cc /hr, carb modified   DVT ppx: SCDs   Code  status: DNI   Dispo: Anticipated discharge in approximately 1 -2 day(s).   Ginette Otto, DO IM PGY-1 Pager: [phone]

## 2017-04-26 NOTE — Evaluation (Signed)
Physical Therapy Evaluation Patient Details Name: Russell Carter MRN: [id number] DOB: 09-28-1956 Today's Date: 04/26/2017   History of Present Illness  Russell Carter is a 61 y.o. 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 He was found to have an INR greater than 8 and a hemoglobin of 6.6. He did have an OT eval at [facility] on 04/04/17, but no subsequent follow up treatments (per Epic).  Clinical Impression  .Pt admitted with/for ABLA, with significant improvement in gait/mobility since admission.  But as he stated the most important thing to work on for his overall function is his L UE.Marland Kitchen  Pt currently limited functionally due to the problems listed below.  (see problems list.)  Pt will benefit from PT to maximize function and safety to be able to get home safely with available assist.     Follow Up Recommendations Other (comment) (Outpatient OT   likely more than OPPT financial dependent)    Equipment Recommendations  None recommended by PT    Recommendations for Other Services       Precautions / Restrictions Restrictions Weight Bearing Restrictions: No      Mobility  Bed Mobility Overal bed mobility: Needs Assistance Bed Mobility: Supine to Sit     Supine to sit: Supervision     General bed mobility comments: increased time required   Transfers Overall transfer level: Needs assistance Equipment used: Rolling walker (2 wheeled) (pushed IV pole) Transfers: Sit to/from Stand Sit to Stand: Supervision         General transfer comment: Supervision for safety initially  Ambulation/Gait Ambulation/Gait assistance: Supervision Ambulation Distance (Feet): 500 Feet Assistive device: Straight cane Gait Pattern/deviations: Step-through pattern Gait  velocity: prefers a slow cadence, but can speed up on cue significantly Gait velocity interpretation: at or above normal speed for age/gender General Gait Details: generally steady with L LE starting to mildly circumduct with fatiigue.  Pt maintained control throughout.  Stairs Stairs: Yes Stairs assistance: Supervision Stair Management: One rail Right;With cane Number of Stairs: 4 General stair comments: safe with AD  Wheelchair Mobility    Modified Rankin (Stroke Patients Only)       Balance Overall balance assessment: Needs assistance Sitting-balance support: No upper extremity supported;Feet supported Sitting balance-Leahy Scale: Good Sitting balance - Comments: sitting EOB and recliner   Standing balance support: Single extremity supported;During functional activity Standing balance-Leahy Scale: Fair Standing balance comment: fair after allowed to get out of room and show he is likely at independent level in homelike environment                             Pertinent Vitals/Pain Pain Assessment: 0-10 Pain Score: 8  Pain Location: L hand - ulner side - by pinky Pain Descriptors / Indicators: Burning;Grimacing;Heaviness;Sharp;Shooting;Tender Pain Intervention(s): Monitored during session    Home Living Family/patient expects to be discharged to:: Private residence Living Arrangements: Parent;Other relatives (mom; brother) Available Help at Discharge: Family;Available 24 hours/day Type of Home: House Home Access: Stairs to enter Entrance Stairs-Rails: None Entrance Stairs-Number of Steps: 1 Home Layout: One level Home Equipment: Shower seat;Cane - single point;Grab bars - tub/shower      Prior Function Level of Independence: Independent with assistive device(s)  Comments: used SPC for ambulation, within the last 2-3 weeks had become independent in ADL including bathing and dressing from sitting     Hand Dominance   Dominant Hand:  Right    Extremity/Trunk Assessment   Upper Extremity Assessment Upper Extremity Assessment: LUE deficits/detail LUE Deficits / Details: limited ROM - grossly 2/5; grasp 3/5, shoulder shrug intact, no elbow flexion, no abduction this session. Pt complains of severe pain when the ulnar side of his hand is touched or rests on anything. LUE Coordination: decreased fine motor;decreased gross motor    Lower Extremity Assessment Lower Extremity Assessment: Overall WFL for tasks assessed;LLE deficits/detail LLE Deficits / Details: mild coordination issues, proximal weakness     Cervical / Trunk Assessment Cervical / Trunk Assessment: Other exceptions Cervical / Trunk Exceptions: recent back surgery in April  Communication   Communication: No difficulties  Cognition Arousal/Alertness: Awake/alert Behavior During Therapy: WFL for tasks assessed/performed (Pt states that he feels depressed since surgery in April) Overall Cognitive Status: Within Functional Limits for tasks assessed                                        General Comments General comments (skin integrity, edema, etc.): Pt expressing frustration about LUE and that it is leading him to depression    Exercises     Assessment/Plan    PT Assessment Patient needs continued PT services  PT Problem List Decreased strength;Decreased activity tolerance;Decreased mobility       PT Treatment Interventions Gait training;Stair training;Functional mobility training;Therapeutic activities;Therapeutic exercise;Patient/family education    PT Goals (Current goals can be found in the Care Plan section)  Acute Rehab PT Goals Patient Stated Goal: to get LUE back to working right again PT Goal Formulation: With patient Time For Goal Achievement: 05/03/17 Potential to Achieve Goals: Good Additional Goals Additional Goal #2: L UE open/closed chain exercise to supplement OT's functional goals for  Pt's l UE.    Frequency  Min 3X/week   Barriers to discharge        Co-evaluation               AM-PAC PT "6 Clicks" Daily Activity  Outcome Measure Difficulty turning over in bed (including adjusting bedclothes, sheets and blankets)?: A Little Difficulty moving from lying on back to sitting on the side of the bed? : A Little Difficulty sitting down on and standing up from a chair with arms (e.g., wheelchair, bedside commode, etc,.)?: None Help needed moving to and from a bed to chair (including a wheelchair)?: None Help needed walking in hospital room?: None Help needed climbing 3-5 steps with a railing? : None 6 Click Score: 22    End of Session   Activity Tolerance: Patient tolerated treatment well Patient left: in chair;with call bell/phone within reach Nurse Communication: Mobility status PT Visit Diagnosis: Other abnormalities of gait and mobility (R26.89);Other symptoms and signs involving the nervous system (R29.898);Pain Pain - Right/Left: Left Pain - part of body: Arm;Hand    Time: 1530-1600 PT Time Calculation (min) (ACUTE ONLY): 30 min   Charges:   PT Evaluation $PT Eval Moderate Complexity: 1 Procedure     PT G Codes:        05-12-17  [place] Bing, PT [phone]  (pager)  Eliseo Gum Sabino Denning 12-May-2017, 6:24 PM

## 2017-04-26 NOTE — Evaluation (Signed)
Occupational Therapy Evaluation Patient Details Name: Russell Carter MRN: [id number] DOB: 01-May-1956 Today's Date: 04/26/2017    History of Present Illness Russell Carter is a 61 y.o. 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 He was found to have an INR greater than 8 and a hemoglobin of 6.6. He did have an OT eval at [facility] on 04/04/17, but no subsequent follow up treatments (per Epic).   Clinical Impression   PTA pt was independent in ADL (recently achieved in the past 2 weeks) and used a [facility] for ambulation. Pt currently min A for ADL requiring BUE and min guard for mobility with SPC (Pt pushing IV pole this session). Please see OT problem list below. Pt concentrating on deficits of LUE and impact to independence and meaningful occupations. Pt will benefit from skilled OT in the acute setting to maximize safety and independence in ADL and will require OPOT for follow up to maximize use of LUE. From epic, it appears that OPOT evaluated the Pt and there was not another treatment session. Next session to establish HEP for LUE.    Follow Up Recommendations  Outpatient OT;Supervision/Assistance - 24 hour    Equipment Recommendations  None recommended by OT (Pt has appropriate DME at home)    Recommendations for Other Services       Precautions / Restrictions Restrictions Weight Bearing Restrictions: No      Mobility Bed Mobility Overal bed mobility: Needs Assistance Bed Mobility: Supine to Sit     Supine to sit: Min guard     General bed mobility comments: increased time required   Transfers Overall transfer level: Needs assistance Equipment used: Rolling walker (2 wheeled) (pushed IV pole) Transfers: Sit to/from Stand Sit to Stand: Min guard          General transfer comment: min guard assist for balance    Balance Overall balance assessment: Needs assistance Sitting-balance support: No upper extremity supported;Feet supported Sitting balance-Leahy Scale: Good Sitting balance - Comments: sitting EOB and recliner   Standing balance support: Single extremity supported;During functional activity Standing balance-Leahy Scale: Poor Standing balance comment: approaching fair - benefits from external support                           ADL either performed or assessed with clinical judgement   ADL Overall ADL's : Needs assistance/impaired Eating/Feeding: Modified independent;Sitting   Grooming: Wash/dry hands;Min guard;Standing Grooming Details (indicate cue type and reason): sink level; using RUE to bring LUE up to level to wash Upper Body Bathing: Min guard;Sitting   Lower Body Bathing: Min guard;Sit to/from stand   Upper Body Dressing : Min guard;Sitting Upper Body Dressing Details (indicate cue type and reason): educated to dress LUE first, Pt stated that is how he was taught, and has been doing it Lower Body Dressing: Min guard;Sitting/lateral leans Lower Body Dressing Details (indicate cue type and reason): able to don/doff socks sitting in recliner Toilet Transfer: Min guard;Cueing for sequencing;Ambulation;RW (and pushing IV pole) Toilet Transfer Details (indicate cue type and reason): on the way to the bathroom Pt used RW (no cane in room) on the way back he pushed IV pole, Pt able to maintain standing for urination Toileting- Clothing Manipulation and Hygiene: Min guard;Sit to/from stand Toileting -  Clothing Manipulation Details (indicate cue type and reason): managed underwear, hospital gown and peri care without physical assist Tub/ Shower Transfer: Min guard   Functional mobility during ADLs: Min guard;Rolling walker (pushing IV pole 2nd half of session) General ADL Comments: Pt is most frustrated about LUE      Vision Baseline Vision/History: Wears glasses Wears Glasses: At all times Vision Assessment?: No apparent visual deficits     Perception     Praxis      Pertinent Vitals/Pain Pain Assessment: 0-10 Pain Score: 8  Pain Location: L hand - ulner side - by pinky Pain Descriptors / Indicators: Burning;Grimacing;Heaviness;Sharp;Shooting;Tender Pain Intervention(s): Monitored during session     Hand Dominance Right   Extremity/Trunk Assessment Upper Extremity Assessment Upper Extremity Assessment: LUE deficits/detail LUE Deficits / Details: limited ROM - grossly 2/5; grasp 3/5, shoulder shrug intact, no elbow flexion, no abduction this session. Pt complains of severe pain when the ulnar side of his hand is touched or rests on anything. LUE Coordination: decreased fine motor;decreased gross motor   Lower Extremity Assessment Lower Extremity Assessment: Defer to PT evaluation   Cervical / Trunk Assessment Cervical / Trunk Assessment: Other exceptions Cervical / Trunk Exceptions: recent back surgery in April   Communication Communication Communication: No difficulties   Cognition Arousal/Alertness: Awake/alert Behavior During Therapy: WFL for tasks assessed/performed (Pt states that he feels depressed since surgery in April) Overall Cognitive Status: Within Functional Limits for tasks assessed                                     General Comments  Pt expressing frustration about LUE and that it is leading him to depression    Exercises     Shoulder Instructions      Home Living Family/patient expects to be discharged to:: Private residence Living Arrangements: Parent;Other relatives (mom; brother) Available Help at Discharge: Family;Available 24 hours/day Type of Home: House Home Access: Stairs to enter [employer] of Steps: 1 Entrance Stairs-Rails: None Home Layout: One level     Bathroom Shower/Tub: Therapist, sports: Standard Bathroom Accessibility: No   Home Equipment: Production assistant, radio - single point;Grab bars - tub/shower          Prior Functioning/Environment Level of Independence: Independent with assistive device(s)        Comments: used SPC for ambulation, within the last 2-3 weeks had become independent in ADL including bathing and dressing from sitting        OT Problem List: Decreased range of motion;Impaired balance (sitting and/or standing);Decreased coordination;Impaired sensation;Impaired UE functional use;Pain      OT Treatment/Interventions: Self-care/ADL training;Neuromuscular education;Therapeutic exercise;Energy conservation;Therapeutic activities;Patient/family education;Balance training    OT Goals(Current goals can be found in the care plan section) Acute Rehab OT Goals Patient Stated Goal: to get LUE back to working right again OT Goal Formulation: With patient Time For Goal Achievement: 05/10/17 Potential to Achieve Goals: Fair ADL Goals Pt Will Perform Grooming: with modified independence;standing (using LUE to assist with LUE) Pt/caregiver will Perform Home Exercise Program: Left upper extremity;Independently;With written HEP provided Additional ADL Goal #1: Pt will describe/recall 3 ways to conserve energy during ADL with 1 or less verbal cues Additional ADL Goal #2: Pt will perform bed mobility as precursor to ADL at modified independent level  OT Frequency: Min 3X/week   Barriers to D/C:  Co-evaluation              AM-PAC PT "6 Clicks" Daily Activity     Outcome Measure Help from another person eating meals?: None Help from another person taking care of personal grooming?: A Little Help from another person toileting, which includes using toliet, bedpan, or urinal?: A Little Help from another person bathing (including washing, rinsing, drying)?: A Little Help from another person to put on and taking off regular upper body clothing?:  A Little Help from another person to put on and taking off regular lower body clothing?: A Little 6 Click Score: 19   End of Session Equipment Utilized During Treatment: Gait belt;Rolling walker;Other (comment) (pushed IV pole the 2nd half of session) Nurse Communication: Mobility status  Activity Tolerance: Patient tolerated treatment well;No increased pain Patient left: in chair;Other (comment) (with PT coming in to work with him)  OT Visit Diagnosis: Unsteadiness on feet (R26.81);Hemiplegia and hemiparesis;Muscle weakness (generalized) (M62.81);Pain Hemiplegia - Right/Left: Left Hemiplegia - dominant/non-dominant: Non-Dominant Hemiplegia - caused by: Unspecified Pain - Right/Left: Left Pain - part of body: Arm;Hand                Time: 1610-9604 OT Time Calculation (min): 16 min Charges:  OT General Charges $OT Visit: 1 Procedure OT Evaluation $OT Eval Moderate Complexity: 1 Procedure G-Codes:     Sherryl Manges OTR/L [phone]  Russell Carter 04/26/2017, 4:24 PM

## 2017-04-26 NOTE — Progress Notes (Signed)
Responded to spiritual care consult to support patient. Patient was a little emotional this morning and just needed someone to talk too.  Patient expressed he had been sick for about three and a half months and when he thinks he is getting better he beigins to feel bad all over again.   Prayed for patient and provided emotional and spiritual support.  Will follow as needed.  Venida Jarvis, [place], [facility], Pager [phone]

## 2017-04-27 DIAGNOSIS — R791 Abnormal coagulation profile: Secondary | ICD-10-CM | POA: Diagnosis present

## 2017-04-27 DIAGNOSIS — K921 Melena: Secondary | ICD-10-CM

## 2017-04-27 LAB — CBC
HCT: 25.6 % — ABNORMAL LOW (ref 39.0–52.0)
Hemoglobin: 8 g/dL — ABNORMAL LOW (ref 13.0–17.0)
MCH: 26.8 pg (ref 26.0–34.0)
MCHC: 31.3 g/dL (ref 30.0–36.0)
MCV: 85.6 fL (ref 78.0–100.0)
PLATELETS: 246 10*3/uL (ref 150–400)
RBC: 2.99 MIL/uL — AB (ref 4.22–5.81)
RDW: 18.1 % — ABNORMAL HIGH (ref 11.5–15.5)
WBC: 5.2 10*3/uL (ref 4.0–10.5)

## 2017-04-27 LAB — BPAM RBC
BLOOD PRODUCT EXPIRATION DATE: 201807302359
Blood Product Expiration Date: 201807122359
ISSUE DATE / TIME: 201807060232
ISSUE DATE / TIME: 201807052316
UNIT TYPE AND RH: 7300
Unit Type and Rh: 1700

## 2017-04-27 LAB — TYPE AND SCREEN
ABO/RH(D): B POS
Antibody Screen: NEGATIVE
UNIT DIVISION: 0
Unit division: 0

## 2017-04-27 LAB — PROTIME-INR
INR: 1.32
PROTHROMBIN TIME: 16.5 s — AB (ref 11.4–15.2)

## 2017-04-27 NOTE — Progress Notes (Signed)
Occupational Therapy Treatment Patient Details Name: Russell Carter MRN: [id number] DOB: 12-Mar-1956 Today's Date: 04/27/2017    History of present illness Russell Carter is a 61 y.o. 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 He was found to have an INR greater than 8 and a hemoglobin of 6.6. He did have an OT eval at [facility] on 04/04/17, but no subsequent follow up treatments (per Epic).   OT comments  This  61 yo male admitted with above presents to acute OT starting LUE exercises today. He will continue to benefit from acute OT with follow up OT at OP to continue work on strengthening LUE.  Follow Up Recommendations  Outpatient OT;Supervision/Assistance - 24 hour    Equipment Recommendations  None recommended by OT       Precautions / Restrictions Restrictions Weight Bearing Restrictions: No       Mobility Bed Mobility Overal bed mobility: Modified Independent Bed Mobility: Supine to Sit     Supine to sit: Modified independent (Device/Increase time)        Transfers Overall transfer level: Needs assistance Equipment used: None Transfers: Sit to/from [employer] Sit to Stand: Supervision Stand pivot transfers: Supervision                ADL either performed or assessed with clinical judgement                  Cognition Arousal/Alertness: Awake/alert Behavior During Therapy: WFL for tasks assessed/performed Overall Cognitive Status: Within Functional Limits for tasks assessed                                          Exercises Other Exercises Other Exercises: pt has shoulder shrug and scapula retraction, composite grip and wrist  flexion/extension3+/5-4/5. His weakness is in supination/pronation (1/5), elbow flexion(1/5); elbow extension (he can resist, but cannot control); shoulder flexion/extension (1/5) and shoulder abduction/adduction (1/5 supine).  Other Exercises: Worked with pt on LUE exercises; AAROM supine with me A'ing him for elbow and shoulder. In sitting I then had him work on elbow flexion with A of peach theraband and then having him slightly resist into elbow extension. Also in sitting I had him to finger walking and arm sliding for shoulder flexion/extension,, shoulder abducition/adduction, and elbow flexion.extension. Provided him with a written handout with all exercises.           Pertinent Vitals/ Pain       Pain Assessment: 0-10 Pain Score: 4  Pain Location: left arm aches Pain Descriptors / Indicators: Aching Pain Intervention(s): Monitored during session;Repositioned         Frequency  Min 3X/week        Progress Toward Goals  OT Goals(current goals can now be found in the care plan section)  Progress towards OT goals: Progressing toward goals     Plan Discharge plan remains appropriate       AM-PAC PT "6 Clicks" Daily Activity     Outcome Measure   Help from another person eating meals?: None Help from another person taking care of personal grooming?: A Little Help from another person toileting,  which includes using toliet, bedpan, or urinal?: A Little Help from another person bathing (including washing, rinsing, drying)?: A Little Help from another person to put on and taking off regular upper body clothing?: A Little Help from another person to put on and taking off regular lower body clothing?: A Little 6 Click Score: 19    End of Session    OT Visit Diagnosis: Unsteadiness on feet (R26.81);Hemiplegia and hemiparesis;Muscle weakness (generalized) (M62.81);Pain Hemiplegia - Right/Left: Left Hemiplegia - dominant/non-dominant: Non-Dominant Hemiplegia - caused by:  Unspecified Pain - Right/Left: Left Pain - part of body: Arm;Shoulder   Activity Tolerance Patient tolerated treatment well   Patient Left in chair;with call bell/phone within reach;with chair alarm set           Time: 1610-9604 OT Time Calculation (min): 47 min  Charges: OT General Charges $OT Visit: 1 Procedure OT Treatments $Therapeutic Exercise: 38-52 mins Ignacia Palma, OTR/L [phone] 04/27/2017

## 2017-04-27 NOTE — Progress Notes (Addendum)
Patient ID: Russell Carter, male   DOB: [birth year], 61 y.o.   MRN: [id number]   Subjective: Russell Carter feels still weaker than his usual self but was participatory with physical therapy. He has passed no blood or loose stools since yesterday. He has no abdominal pain.  Objective:  Vital signs in last 24 hours: Vitals:   04/26/17 1440 04/26/17 2245 04/27/17 0552 04/27/17 1349  BP: 114/66 118/84 117/82 118/67  Pulse: 73 80 70 79  Resp: 18   16  Temp: 97.7 F (36.5 C) 98.7 F (37.1 C) 98.3 F (36.8 C) 98.7 F (37.1 C)  TempSrc: Oral Oral Oral Oral  SpO2: 100% 100% 100% 100%  Weight:      Height:       Physical Exam  Constitutional: He appears well-developed and well-nourished.  Cardiovascular: Normal rate, regular rhythm and normal heart sounds.   Pulmonary/Chest: Effort normal and breath sounds normal. No respiratory distress.  Abdominal: Soft. Bowel sounds are normal. He exhibits no distension. There is no tenderness.  Tenderness to palpation at LUQ  Musculoskeletal: He exhibits no edema.  Skin: Skin is warm and dry.   Assessment/Plan:  Russell Carter is a 61 y.o. 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   Anemia from GI blood loss Supertherapeutic INR Hgb 8.5->8.0 on repeat today but without any reported stools or evidence of bleeding. We will observe at least one more day. And if stable he could be discharged to resume his lovenox and coumadin with outpatient follow up. We would need inpatient EGD to exclude a source of bleeding if he drops significant tomorrow or has gross blood loss.  HTN Blood pressure remains well controlled here without home  medications.  Post-operative pain and weakness His weakness is improving, likely due to improvement in his anemia. We are continuing home narcotic analgesics here.  Type 2 DM A1c 6.2 on 01/2017. He is diet controlled at home and normal at presentation, so no invasive monitoring needed here.   F/E/N: carb modified  DVT ppx: SCDs  Code status: DNI   Dispo: Anticipated discharge in approximately 1 day  Fuller Plan, MD PGY-III Internal Medicine Resident Pager# [phone] 04/27/2017, 6:42 PM  Internal Medicine Attending  Date: 04/27/2017  Patient name: Russell Carter Medical record number: [id number] Date of birth: 1956-07-22 Age: 61 y.o. Gender: male  I discussed Russell Carter interim history and labs with the housestaff and reviewed the resident's note by Dr. Dimple Carter.  I agree with the resident's findings and plans as documented in his progress note.

## 2017-04-28 LAB — CBC
HCT: 28.9 % — ABNORMAL LOW (ref 39.0–52.0)
Hemoglobin: 8.8 g/dL — ABNORMAL LOW (ref 13.0–17.0)
MCH: 26.5 pg (ref 26.0–34.0)
MCHC: 30.4 g/dL (ref 30.0–36.0)
MCV: 87 fL (ref 78.0–100.0)
PLATELETS: 278 10*3/uL (ref 150–400)
RBC: 3.32 MIL/uL — AB (ref 4.22–5.81)
RDW: 18.1 % — AB (ref 11.5–15.5)
WBC: 5 10*3/uL (ref 4.0–10.5)

## 2017-04-28 MED ORDER — ENOXAPARIN SODIUM 80 MG/0.8ML ~~LOC~~ SOLN
1.0000 mg/kg | Freq: Two times a day (BID) | SUBCUTANEOUS | Status: DC
  Administered 2017-04-28: 13:00:00 80 mg via SUBCUTANEOUS
  Filled 2017-04-28: qty 0.8

## 2017-04-28 MED ORDER — WARFARIN - PHYSICIAN DOSING INPATIENT
Freq: Every day | Status: DC
Start: 2017-04-28 — End: 2017-04-28

## 2017-04-28 MED ORDER — WARFARIN SODIUM 5 MG PO TABS
5.0000 mg | ORAL_TABLET | Freq: Once | ORAL | Status: AC
  Administered 2017-04-28: 5 mg via ORAL
  Filled 2017-04-28: qty 1

## 2017-04-28 NOTE — Plan of Care (Signed)
Problem: Education: Goal: Knowledge of [facility] Education information/materials will improve Outcome: Progressing POC reviewed with pt.   

## 2017-04-28 NOTE — Care Management Note (Addendum)
Case Management Note  Patient Details  Name: Russell Carter MRN: [id number] Date of Birth: Sep 13, 1956  Subjective/Objective:   Pt admitted with DVT/PE  Action/Plan:  PTA from home independent - pt states he lives with both his mother and brother - he will have 24 hour supervision.  Pt states he uses a cane in the home.  Pt already has appt set up with [facility] next week 7/11 and states he has ample supply of all medications (including lovenox) and therefore medication assistance is not needed - medications on AVS were the same medications taken PTA.   Expected Discharge Date:  04/28/17               Expected Discharge Plan:  Home/Self Care  In-House Referral:     Discharge planning Services  CM Consult  Post Acute Care Choice:    Choice offered to:  Patient  DME Arranged:    DME Agency:     HH Arranged:    HH Agency:     Status of Service:     If discussed at [employer], dates discussed:    Additional Comments: Outpt OT referral sent via Epic  CM text paged attending requesting Outpt OT order to arrange service as recommended.  Pt is in agreement with outpt OT Cherylann Parr, RN 04/28/2017, 12:46 PM

## 2017-04-28 NOTE — Discharge Summary (Signed)
Name: Russell Carter MRN: [id number] DOB: 1955/10/24 61 y.o. PCP: Patient, No Pcp Per  Date of Admission: 04/25/2017  3:54 PM Date of Discharge: 04/28/2017 Attending Physician: Dr. Doneen Poisson  Discharge Diagnosis: Principal Problem:   Blood loss anemia Active Problems:   Essential hypertension   Diabetes mellitus type 2 in nonobese (HCC)   Elevated INR   Gastrointestinal hemorrhage with melena   Discharge Medications: Allergies as of 04/28/2017      Reactions   Shellfish Allergy Anaphylaxis      Medication List    STOP taking these medications   amLODipine 5 MG tablet Commonly known as:  NORVASC   hydrochlorothiazide 25 MG tablet Commonly known as:  HYDRODIURIL   losartan 100 MG tablet Commonly known as:  COZAAR     TAKE these medications   allopurinol 300 MG tablet Commonly known as:  ZYLOPRIM TAKE ONE TABLET BY MOUTH ONCE DAILY   baclofen 10 MG tablet Commonly known as:  LIORESAL Take 1 tablet (10 mg total) by mouth 3 (three) times daily.   CALCIUM-VITAMIN D PO Take 1 tablet by mouth daily.   clopidogrel 75 MG tablet Commonly known as:  PLAVIX Take 1 tablet (75 mg total) by mouth daily.   docusate sodium 100 MG capsule Commonly known as:  COLACE Take 1 capsule (100 mg total) by mouth 2 (two) times daily.   enoxaparin 80 MG/0.8ML injection Commonly known as:  LOVENOX Inject 0.8 mLs (80 mg total) into the skin every 12 (twelve) hours.   gabapentin 400 MG capsule Commonly known as:  NEURONTIN Take 2 capsules (800 mg total) by mouth 4 (four) times daily.   methocarbamol 750 MG tablet Commonly known as:  ROBAXIN Take 1 tablet (750 mg total) by mouth every 6 (six) hours as needed for muscle spasms.   nitroGLYCERIN 0.4 MG SL tablet Commonly known as:  NITROSTAT Place 1 tablet (0.4 mg total) under the tongue every 5 (five) minutes as needed for chest pain.   oxyCODONE-acetaminophen 7.5-325 MG tablet Commonly known as:  PERCOCET Take 1 tablet by  mouth every 8 (eight) hours as needed.   pantoprazole 40 MG tablet Commonly known as:  PROTONIX Take 1 tablet (40 mg total) by mouth at bedtime.   rosuvastatin 40 MG tablet Commonly known as:  CRESTOR Take 1 tablet (40 mg total) by mouth daily. What changed:  when to take this   warfarin 5 MG tablet Commonly known as:  COUMADIN Take 1 tablet (5 mg total) by mouth daily.       Disposition and follow-up:   RussellRussell Carter was discharged from [facility] in Good condition.  At the hospital follow up visit please address:  1.  Anticoagulation for provoked PE/DVT: Russell Carter developed apparently spontaneous GI bleeding with a supratherapeutic INR> 8 after increase to his outpatient Coumadin. He was discharged to resume a new 5 day Lovenox bridge to Coumadin at the lower previous dose 5 mg daily. Please monitor INR and titrate appropriately--it is possible he may have a delayed response to Coumadin that led to supertherapeutic levels after titration up.  2.  Hypertension: His blood pressure was consistently low to normal during the admission and was discharged off of amlodipine, HCTZ, and losartan which he took PTA. Please resume some or all as appropriate.  3.  Anemia due to GI bleeding: Please re-check Hgb at follow up and ask if there is new hematochezia or melena.  4.  Deconditioning: Outpatient physical therapy was  ordered at discharge, please ask if they are in contact or barriers are identified.  5.  Labs / imaging needed at time of follow-up: INR, H&H   Follow-up Appointments: 7/10 at [facility]   7/12 at [facility]   Russell Carter Hospital Course by problem list:   Blood loss anemia Supertherapeutic INR Russell Carter presented with symptomatic anemia and hemoglobin of 6.6 after noticing bloody stools and melena at home. This responded appropriately after transfusion of 2 units PRBCs and was 8.8 prior to discharge. His  supratherapeutic INR of >8 on arrival was treated with IV vitamin K and the coumadin was held, with spontaneous resolution of his GI bleeding. This was attributed to mucosal bleeding and endoscopic examination was not pursued inpatient. Plavix was also resumed.  Essential Hypertension His blood pressure was consistently low to normal during the admission with some improvement after transfusion and IV fluids. He was discharged off of amlodipine, HCTZ, and losartan which he took PTA because blood pressure remained low or normal without medications.  Deconditioning Russell Carter is weaker than less energetic than at the start of the year, from chronic medical illness and repeat hospitalizations. He worked well with PT/OT with a recommendation for outpatient rehab which was ordered.  Diabetes Blood glucose was fine during the admission without any medication   Discharge Vitals:   BP 124/79 (BP Location: Right Arm)   Pulse 68   Temp 98.3 F (36.8 C) (Oral)   Resp 14   Ht 5\' 11"  (1.803 m)   Wt 180 lb (81.6 kg)   SpO2 100%   BMI 25.10 kg/m   Discharge Instructions: Discharge Instructions    Ambulatory referral to Occupational Therapy    Complete by:  As directed    Call MD for:  difficulty breathing, headache or visual disturbances    Complete by:  As directed    Call MD for:  persistant dizziness or light-headedness    Complete by:  As directed    Diet - low sodium heart healthy    Complete by:  As directed    Discharge instructions    Complete by:  As directed    You will need to restart taking your coumadin and lovenox shots tomorrow morning. You have an appointment in the clinic on Wednesday and this is very important to keep for blood work.  We will get you in touch with physical therapy. I think a lot of your current weakness and fatigue is worsened by recently decreased activity level from medical problems and hospitalization.   Increase activity slowly    Complete by:  As  directed       Signed: Fuller Plan, MD PGY-III Internal Medicine Resident Pager# [phone] 04/30/2017, 2:37 PM

## 2017-04-28 NOTE — Progress Notes (Signed)
   Subjective: Mr. Beedle still feels weak and fatigued but without specific problems. This is the same weakness he has had for the preceding weeks ever since his hospitalization and multiple ED visits. He had one formed bowel movement.  Objective:  Vital signs in last 24 hours: Vitals:   04/27/17 0552 04/27/17 1349 04/27/17 2114 04/28/17 0502  BP: 117/82 118/67 120/78 124/79  Pulse: 70 79 76 68  Resp:  16 16 14   Temp: 98.3 F (36.8 C) 98.7 F (37.1 C) 98.4 F (36.9 C) 98.3 F (36.8 C)  TempSrc: Oral Oral Oral Oral  SpO2: 100% 100% 100% 100%  Weight:      Height:       Physical Exam  Constitutional: He appears well-developed and well-nourished.  Cardiovascular: Normal rate, regular rhythm and normal heart sounds.   Abdominal: Soft. He exhibits no distension. There is no tenderness.  Tenderness to palpation at LUQ  Musculoskeletal: He exhibits no edema.  Skin: Skin is warm and dry.   Assessment/Plan:  Ashdon Kriete is a 61 y.o. 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   Anemia from GI blood loss Supertherapeutic INR Hgb is stable at 8.8 on repeat CBC and there is no clinical evidence of recurrent bleeding. We will resume his anticoagulation at the lower, previous dose of coumadin 5mg  daily with therapeutic dose lovenox bridging. He currently has an appointment scheduled on 7/11 with [facility] who will follow his therapeutic drug monitoring. We will start with his first dose of medication today PM prior to discharge, and he can resume treatment with home medications tomorrow morning.  HTN Blood pressure remains well controlled here without home medications. I will  not recommend resuming home antihypertensives unless he is elevated again at clinic follow up.  Post-operative pain and weakness This is probably multifactorial from anemia, which has been addressed, and deconditioning which will be followed up by outpatient physical therapy. HE has an appointment with [facility] on 7/12. We are continuing home narcotic analgesics here.  F/E/N: carb modified  DVT ppx: Lovenox Code status: DNI   Dispo: Anticipated discharge later today  Fuller Plan, MD [facility] Internal Medicine Resident Pager# [phone] 04/28/2017, 11:54 AM

## 2017-05-01 ENCOUNTER — Encounter: Payer: Self-pay | Admitting: Internal Medicine

## 2017-05-01 ENCOUNTER — Ambulatory Visit: Payer: Medicaid Other | Attending: Internal Medicine | Admitting: Internal Medicine

## 2017-05-01 VITALS — BP 130/71 | HR 87 | Temp 99.0°F | Resp 18 | Ht 71.0 in | Wt 173.8 lb

## 2017-05-01 DIAGNOSIS — E1122 Type 2 diabetes mellitus with diabetic chronic kidney disease: Secondary | ICD-10-CM | POA: Diagnosis not present

## 2017-05-01 DIAGNOSIS — I442 Atrioventricular block, complete: Secondary | ICD-10-CM | POA: Insufficient documentation

## 2017-05-01 DIAGNOSIS — Z86711 Personal history of pulmonary embolism: Secondary | ICD-10-CM | POA: Insufficient documentation

## 2017-05-01 DIAGNOSIS — E1165 Type 2 diabetes mellitus with hyperglycemia: Secondary | ICD-10-CM

## 2017-05-01 DIAGNOSIS — I129 Hypertensive chronic kidney disease with stage 1 through stage 4 chronic kidney disease, or unspecified chronic kidney disease: Secondary | ICD-10-CM | POA: Insufficient documentation

## 2017-05-01 DIAGNOSIS — E7800 Pure hypercholesterolemia, unspecified: Secondary | ICD-10-CM | POA: Diagnosis not present

## 2017-05-01 DIAGNOSIS — D689 Coagulation defect, unspecified: Secondary | ICD-10-CM | POA: Diagnosis not present

## 2017-05-01 DIAGNOSIS — Z79899 Other long term (current) drug therapy: Secondary | ICD-10-CM | POA: Insufficient documentation

## 2017-05-01 DIAGNOSIS — F329 Major depressive disorder, single episode, unspecified: Secondary | ICD-10-CM | POA: Diagnosis not present

## 2017-05-01 DIAGNOSIS — M109 Gout, unspecified: Secondary | ICD-10-CM | POA: Diagnosis not present

## 2017-05-01 DIAGNOSIS — Z8673 Personal history of transient ischemic attack (TIA), and cerebral infarction without residual deficits: Secondary | ICD-10-CM | POA: Diagnosis not present

## 2017-05-01 DIAGNOSIS — N189 Chronic kidney disease, unspecified: Secondary | ICD-10-CM | POA: Insufficient documentation

## 2017-05-01 DIAGNOSIS — D649 Anemia, unspecified: Secondary | ICD-10-CM | POA: Diagnosis not present

## 2017-05-01 DIAGNOSIS — Z7901 Long term (current) use of anticoagulants: Secondary | ICD-10-CM | POA: Insufficient documentation

## 2017-05-01 DIAGNOSIS — I2510 Atherosclerotic heart disease of native coronary artery without angina pectoris: Secondary | ICD-10-CM | POA: Diagnosis not present

## 2017-05-01 DIAGNOSIS — I252 Old myocardial infarction: Secondary | ICD-10-CM | POA: Diagnosis not present

## 2017-05-01 DIAGNOSIS — Z955 Presence of coronary angioplasty implant and graft: Secondary | ICD-10-CM | POA: Diagnosis not present

## 2017-05-01 DIAGNOSIS — E119 Type 2 diabetes mellitus without complications: Secondary | ICD-10-CM | POA: Diagnosis not present

## 2017-05-01 DIAGNOSIS — Z91013 Allergy to seafood: Secondary | ICD-10-CM | POA: Insufficient documentation

## 2017-05-01 LAB — POCT GLYCOSYLATED HEMOGLOBIN (HGB A1C): Hemoglobin A1C: 4.9

## 2017-05-01 LAB — GLUCOSE, POCT (MANUAL RESULT ENTRY): POC Glucose: 128 mg/dl — AB (ref 70–99)

## 2017-05-01 LAB — POCT INR: INR: 1.7

## 2017-05-01 MED FILL — traMADol HCL 50 MG TABS: 50 | 22 days supply | Qty: 90 | Fill #0

## 2017-05-01 NOTE — Progress Notes (Signed)
Patient needs refill on pantoprazole, rosuvastatin baclofen, clopidogel, docuste sodime

## 2017-05-01 NOTE — Patient Instructions (Signed)
Anemia, Nonspecific Anemia is a condition in which the concentration of red blood cells or hemoglobin in the blood is below normal. Hemoglobin is a substance in red blood cells that carries oxygen to the tissues of the body. Anemia results in not enough oxygen reaching these tissues. What are the causes? Common causes of anemia include:  Excessive bleeding. Bleeding may be internal or external. This includes excessive bleeding from periods (in women) or from the intestine.  Poor nutrition.  Chronic kidney, thyroid, and liver disease.  Bone marrow disorders that decrease red blood cell production.  Cancer and treatments for cancer.  HIV, AIDS, and their treatments.  Spleen problems that increase red blood cell destruction.  Blood disorders.  Excess destruction of red blood cells due to infection, medicines, and autoimmune disorders. What are the signs or symptoms?  Donado weakness.  Dizziness.  Headache.  Palpitations.  Shortness of breath, especially with exercise.  Paleness.  Cold sensitivity.  Indigestion.  Nausea.  Difficulty sleeping.  Difficulty concentrating. Symptoms may occur suddenly or they may develop slowly. How is this diagnosed? Additional blood tests are often needed. These help your health care provider determine the best treatment. Your health care provider will check your stool for blood and look for other causes of blood loss. How is this treated? Treatment varies depending on the cause of the anemia. Treatment can include:  Supplements of iron, vitamin B12, or folic acid.  Hormone medicines.  A blood transfusion. This may be needed if blood loss is severe.  Hospitalization. This may be needed if there is significant continual blood loss.  Dietary changes.  Spleen removal. Follow these instructions at home: Keep all follow-up appointments. It often takes many weeks to correct anemia, and having your health care provider check on your  condition and your response to treatment is very important. Get help right away if:  You develop extreme weakness, shortness of breath, or chest pain.  You become dizzy or have trouble concentrating.  You develop heavy vaginal bleeding.  You develop a rash.  You have bloody or black, tarry stools.  You faint.  You vomit up blood.  You vomit repeatedly.  You have abdominal pain.  You have a fever or persistent symptoms for more than 2-3 days.  You have a fever and your symptoms suddenly get worse.  You are dehydrated. This information is not intended to replace advice given to you by your health care provider. Make sure you discuss any questions you have with your health care provider. Document Released: 11/15/2004 Document Revised: 03/21/2016 Document Reviewed: 04/03/2013 Elsevier Interactive Patient Education  2017 Elsevier Inc.  

## 2017-05-02 ENCOUNTER — Encounter: Payer: Medicaid Other | Attending: Physical Medicine & Rehabilitation | Admitting: Registered Nurse

## 2017-05-02 ENCOUNTER — Other Ambulatory Visit: Payer: Self-pay | Admitting: *Deleted

## 2017-05-02 ENCOUNTER — Encounter: Payer: Self-pay | Admitting: Registered Nurse

## 2017-05-02 VITALS — BP 115/81 | HR 76

## 2017-05-02 DIAGNOSIS — M4802 Spinal stenosis, cervical region: Secondary | ICD-10-CM

## 2017-05-02 DIAGNOSIS — E1122 Type 2 diabetes mellitus with diabetic chronic kidney disease: Secondary | ICD-10-CM | POA: Insufficient documentation

## 2017-05-02 DIAGNOSIS — M4722 Other spondylosis with radiculopathy, cervical region: Secondary | ICD-10-CM | POA: Diagnosis not present

## 2017-05-02 DIAGNOSIS — G894 Chronic pain syndrome: Secondary | ICD-10-CM | POA: Diagnosis not present

## 2017-05-02 DIAGNOSIS — F329 Major depressive disorder, single episode, unspecified: Secondary | ICD-10-CM | POA: Diagnosis not present

## 2017-05-02 DIAGNOSIS — R040 Epistaxis: Secondary | ICD-10-CM | POA: Insufficient documentation

## 2017-05-02 DIAGNOSIS — I129 Hypertensive chronic kidney disease with stage 1 through stage 4 chronic kidney disease, or unspecified chronic kidney disease: Secondary | ICD-10-CM | POA: Diagnosis not present

## 2017-05-02 DIAGNOSIS — N183 Chronic kidney disease, stage 3 (moderate): Secondary | ICD-10-CM | POA: Diagnosis not present

## 2017-05-02 DIAGNOSIS — F1010 Alcohol abuse, uncomplicated: Secondary | ICD-10-CM | POA: Diagnosis not present

## 2017-05-02 DIAGNOSIS — E7800 Pure hypercholesterolemia, unspecified: Secondary | ICD-10-CM | POA: Insufficient documentation

## 2017-05-02 DIAGNOSIS — I252 Old myocardial infarction: Secondary | ICD-10-CM | POA: Diagnosis present

## 2017-05-02 DIAGNOSIS — F1429 Cocaine dependence with unspecified cocaine-induced disorder: Secondary | ICD-10-CM | POA: Insufficient documentation

## 2017-05-02 DIAGNOSIS — R269 Unspecified abnormalities of gait and mobility: Secondary | ICD-10-CM | POA: Insufficient documentation

## 2017-05-02 DIAGNOSIS — M5416 Radiculopathy, lumbar region: Secondary | ICD-10-CM

## 2017-05-02 DIAGNOSIS — Z8673 Personal history of transient ischemic attack (TIA), and cerebral infarction without residual deficits: Secondary | ICD-10-CM | POA: Insufficient documentation

## 2017-05-02 DIAGNOSIS — Z981 Arthrodesis status: Secondary | ICD-10-CM | POA: Insufficient documentation

## 2017-05-02 DIAGNOSIS — M109 Gout, unspecified: Secondary | ICD-10-CM | POA: Diagnosis not present

## 2017-05-02 DIAGNOSIS — I2510 Atherosclerotic heart disease of native coronary artery without angina pectoris: Secondary | ICD-10-CM | POA: Diagnosis present

## 2017-05-02 LAB — CBC WITH DIFFERENTIAL/PLATELET
BASOS: 0 %
Basophils Absolute: 0 10*3/uL (ref 0.0–0.2)
EOS (ABSOLUTE): 0.1 10*3/uL (ref 0.0–0.4)
EOS: 2 %
HEMATOCRIT: 29.3 % — AB (ref 37.5–51.0)
Hemoglobin: 9 g/dL — ABNORMAL LOW (ref 13.0–17.7)
Immature Grans (Abs): 0 10*3/uL (ref 0.0–0.1)
Immature Granulocytes: 0 %
LYMPHS ABS: 1.7 10*3/uL (ref 0.7–3.1)
Lymphs: 27 %
MCH: 26.9 pg (ref 26.6–33.0)
MCHC: 30.7 g/dL — AB (ref 31.5–35.7)
MCV: 88 fL (ref 79–97)
MONOS ABS: 0.6 10*3/uL (ref 0.1–0.9)
Monocytes: 10 %
Neutrophils Absolute: 4 10*3/uL (ref 1.4–7.0)
Neutrophils: 61 %
Platelets: 370 10*3/uL (ref 150–379)
RBC: 3.35 x10E6/uL — ABNORMAL LOW (ref 4.14–5.80)
RDW: 17.6 % — AB (ref 12.3–15.4)
WBC: 6.5 10*3/uL (ref 3.4–10.8)

## 2017-05-02 LAB — POCT UA - MICROALBUMIN
Creatinine, POC: 200 mg/dL
MICROALBUMIN (UR) POC: 150 mg/L

## 2017-05-02 MED ORDER — CLOPIDOGREL BISULFATE 75 MG PO TABS: 75.0000 mg | ORAL_TABLET | Freq: Every day | ORAL | 0 refills | Status: DC

## 2017-05-02 MED ORDER — ROSUVASTATIN CALCIUM 40 MG PO TABS: 40.0000 mg | ORAL_TABLET | Freq: Every day | ORAL | 3 refills | Status: DC

## 2017-05-02 MED ORDER — METHOCARBAMOL 750 MG PO TABS: 750.0000 mg | ORAL_TABLET | Freq: Three times a day (TID) | ORAL | 1 refills | Status: DC | PRN

## 2017-05-02 MED ORDER — GABAPENTIN 400 MG PO CAPS: 800.0000 mg | ORAL_CAPSULE | Freq: Four times a day (QID) | ORAL | 3 refills | Status: DC

## 2017-05-02 MED ORDER — ALLOPURINOL 300 MG PO TABS: 300.0000 mg | ORAL_TABLET | Freq: Every day | ORAL | 6 refills | Status: DC

## 2017-05-02 MED ORDER — WARFARIN SODIUM 5 MG PO TABS: 5.0000 mg | ORAL_TABLET | Freq: Every day | ORAL | 3 refills | Status: DC

## 2017-05-02 MED ORDER — NITROGLYCERIN 0.4 MG SL SUBL: 0.4000 mg | SUBLINGUAL_TABLET | SUBLINGUAL | 4 refills | Status: DC | PRN

## 2017-05-02 MED ORDER — PANTOPRAZOLE SODIUM 40 MG PO TBEC: 40.0000 mg | DELAYED_RELEASE_TABLET | Freq: Every day | ORAL | 1 refills | Status: DC

## 2017-05-02 MED FILL — FERROUS SULFATE 325 MG TAB: 325 (65 FE) | 22 days supply | Qty: 90 | Fill #0

## 2017-05-02 NOTE — Progress Notes (Signed)
Subjective:    Patient ID: Russell Carter, male    DOB: [birth year], 61 y.o.   MRN: [id number]  HPI: Russell Carter is a 60 year old male who returns for follow up appointment regarding his cervical stenosis and myelopathy. He states his pain is located in his  neck radiating into his left arm with tingling and burning, also lower back pain radiating into his left hip and left lower extremity. He rates his pain 10. His current exercise regime is walking.   He was [facility] to [facility] on July [facility]th, 2018 and discharged on 04/28/2017 for anemia related to blood loss.   Dr. Bevely Palmer prescribing his Oxycodone   Pain Inventory Average Pain 10 Pain Right Now 10 My pain is constant, sharp, burning, stabbing, tingling and aching  In the last 24 hours, has pain interfered with the following? General activity 8 Relation with others 9 Enjoyment of life 10 What TIME of day is your pain at its worst? morning and night Sleep (in general) Poor  Pain is worse with: walking Pain improves with: rest, therapy/exercise, medication and injections Relief from Meds: 2  Mobility walk with assistance use a cane how many minutes can you walk? 30 ability to climb steps?  yes do you drive?  no  Function not employed: date last employed . I need assistance with the following:  dressing, bathing, toileting, meal prep and household duties  Neuro/Psych bladder control problems weakness numbness tremor spasms dizziness depression  Prior Studies Any changes since last visit?  no  Physicians involved in your care Any changes since last visit?  no Primary care [place] Neuro[facility]geon Sharlet Salina Ditty   Family History  Problem Relation Age of Onset  . Heart attack Mother        x7. Still alive  . Heart attack Father        deceased b/c of MI  . Heart attack Brother        older, deceased b/c of MI   . Cancer Brother        bone cancer in 1 brother, ? brain cancer in other brother   Social  History   Social History  . Marital status: Widowed    Spouse name: N/A  . Number of children: N/A  . Years of education: N/A   Social History Main Topics  . Smoking status: Never Smoker  . Smokeless tobacco: Never Used  . Alcohol use Yes     Comment: 16 oz beer every 2-3 days  . Drug use: No     Comment: 03/14/2016 "quit 2008"  . Sexual activity: Not Currently   Other Topics Concern  . None   Social History Narrative   Lives in [place] with his wife and son.    Past Surgical History:  Procedure Laterality Date  . ANTERIOR CERVICAL DECOMP/DISCECTOMY FUSION N/A 01/24/2017   Procedure: Cervical Four-Five,Cervical Five Six,Cervical Six-Seven Anterior cervical discectomy with fusion and plate with Cervical Three-Thoracic- One Laminectomy/Foraminotomy with Cervical Three-Thoracic- One Fixation and fusion;  Surgeon: Loura Halt Ditty, MD;  Location: [facility] OR;  Service: Neurosurgery;  Laterality: N/A;  Part-1 anterior approach  . CARDIAC CATHETERIZATION  2008; ~ 2011   [place]-40%lad  . CARDIAC CATHETERIZATION N/A 03/16/2016   Procedure: Left Heart Cath and Coronary Angiography;  Surgeon: Marykay Lex, MD;  Location: [facility] INVASIVE CV LAB;  Service: Cardiovascular;  Laterality: N/A;  . CORONARY ANGIOPLASTY     STENTS  . EYE SURGERY Bilateral    "for  double vision"  . INGUINAL HERNIA REPAIR  05/12/2012   Procedure: HERNIA REPAIR INGUINAL ADULT;  Surgeon: Liz Malady, MD;  Location: [facility];  Service: General;  Laterality: Right;  . INGUINAL HERNIA REPAIR Left 02/02/2003   Dr. Violeta Gelinas. repair with mesh. Same procedure done again in  05/22/2004   . INGUINAL HERNIA REPAIR Left ?date 2nd OR  . POSTERIOR CERVICAL FUSION/FORAMINOTOMY N/A 01/24/2017   Procedure: Cervical Three-Thoracic One-Laminectomy/Foraminotomy with Cervical Three-Thoracic One Fixation and fusion;  Surgeon: Loura Halt Ditty, MD;  Location: [facility] OR;  Service: Neurosurgery;  Laterality: N/A;   Part-2 Posterior approach  . RECONSTRUCT / STABILIZE DISTAL ULNA    . SHOULDER OPEN ROTATOR CUFF REPAIR Left 2010  . SHOULDER SURGERY Left    "injured on the job; had to cut small bone out"  . THUMB FUSION Right    Past Medical History:  Diagnosis Date  . Anginal pain (HCC)   . Arthritis   . Blood in stool   . CAD (coronary artery disease)    a. 2011: cath showing mod non-obstructive disease b. 02/2016: cath with 95% stenosis in the Mid RCA. DES placed.  . Cervical stenosis of spine   . Cocaine abuse    history  . Depression   . Diabetes mellitus without complication (HCC)   . Gout, unspecified   . Headache   . HTN (hypertension)   . Hypercholesterolemia   . Hyperglycemia   . Migraines   . Myocardial infarction [facility]) 2008   drug cocaine and marijuana at time of mi none  since    . Pneumonia   . Renal insufficiency   . Stroke (HCC)    BP 115/81 (BP Location: Right Arm, Patient Position: Sitting, Cuff Size: Normal)   Pulse 76   SpO2 98%   Opioid Risk Score:   Fall Risk Score:  `1  Depression screen PHQ 2/9  Depression screen [facility] 2/9 05/01/2017 02/21/2017  Decreased Interest 3 0  Down, Depressed, Hopeless 3 1  PHQ - 2 Score 6 1  Altered sleeping 3 0  Tired, decreased energy 3 0  Change in appetite 3 0  Feeling bad or failure about yourself  0 0  Trouble concentrating 0 0  Moving slowly or fidgety/restless 0 0  Suicidal thoughts 3 0  PHQ-9 Score 18 1  Difficult doing work/chores - Not difficult at all    Review of Systems  Constitutional: Positive for unexpected weight change.  HENT: Negative.   Eyes: Negative.   Respiratory: Positive for cough and shortness of breath.   Cardiovascular: Positive for leg swelling.  Gastrointestinal: Positive for abdominal pain.  Endocrine:       High blood sugars  Genitourinary: Positive for difficulty urinating.  Musculoskeletal:       Spasms  Skin: Negative.   Allergic/Immunologic: Negative.   Neurological: Positive for  dizziness, weakness and numbness.  Hematological: Bruises/bleeds easily.  Psychiatric/Behavioral: Positive for dysphoric mood.  All other systems reviewed and are negative.      Objective:   Physical Exam  Constitutional: He is oriented to person, place, and time. He appears well-developed and well-nourished.  HENT:  Head: Normocephalic and atraumatic.  Neck: Normal range of motion. Neck supple.  Cardiovascular: Normal rate and regular rhythm.   Pulmonary/Chest: Effort normal and breath sounds normal.  Musculoskeletal:  Normal Muscle Bulk and Muscle Testing Reveals: Upper Extremities: Right: Decreased ROM 45 Degrees and Muscle Strength 4/5 Left: Decreased ROM 45 Degrees and Muscle Strength  3/5 Thoracic Paraspinal Tenderness: T-1-T-3 T-7-T-9 Lower Extremities: Full ROM and Muscle Strength 5/5 Arises from Table Slowly, using cane for support Narrow Based Gait  Neurological: He is alert and oriented to person, place, and time.  Skin: Skin is warm and dry.  Psychiatric: He has a normal mood and affect.  Nursing note and vitals reviewed.         Assessment & Plan:  1. Cervical spondylosis with radiculopathy s/p C4-7 anterior discectomy and fusion, posterior cervical fusion C3-C6. Dr. Bevely Palmer Following 2. Pain Management: Continue Gabapentin, Oxycodone and Tramadol. Dr. Bevely Palmer prescribing Oxycodone.  NCCSRS was reviewed today.  3.. Gait abnormality: Continue Actor.  30 minutes of face to face patient care time was spent during this visit. All questions were encouraged and answered.   Follow up with NP in about one month.

## 2017-05-02 NOTE — Telephone Encounter (Signed)
MEDICATIONS WERE PRINTED TO SEND TO [facility]

## 2017-05-03 MED FILL — PANTOPRAZOLE SOD DR 40 MG T: 40 | 30 days supply | Qty: 30 | Fill #0

## 2017-05-03 MED FILL — ROSUVASTATIN CALCIUM 40 MG: 40 | 30 days supply | Qty: 30 | Fill #0

## 2017-05-07 ENCOUNTER — Ambulatory Visit: Payer: Medicaid Other | Attending: Internal Medicine | Admitting: Pharmacist

## 2017-05-07 DIAGNOSIS — I2699 Other pulmonary embolism without acute cor pulmonale: Secondary | ICD-10-CM | POA: Insufficient documentation

## 2017-05-07 DIAGNOSIS — I2782 Chronic pulmonary embolism: Secondary | ICD-10-CM

## 2017-05-07 LAB — POCT INR: INR: 2.8

## 2017-05-08 ENCOUNTER — Ambulatory Visit: Payer: Medicaid Other | Attending: Internal Medicine | Admitting: Pharmacist

## 2017-05-08 DIAGNOSIS — I2782 Chronic pulmonary embolism: Secondary | ICD-10-CM | POA: Diagnosis not present

## 2017-05-08 LAB — POCT INR: INR: 2.9

## 2017-05-12 NOTE — Progress Notes (Signed)
Russell Carter, is a 61 y.o. male  ZOX:[id number]  [id number]  DOB - [birth year]  Chief Complaint  Patient presents with  . Coagulation Disorder       Subjective:   Russell Carter is a 61 y.o. male with medical history of hypertension, polysubstance abuse, diabetes mellitus type 2, coronary artery disease status post PCI of the right coronary artery in 2017, hyperlipidemia, TIA, transient complete heart block resolved after PCI of the RCA, cervical stenosis and chronic kidney disease here today for a hospital discharge follow up visit.  On April 23rd 2018, he was discharged after a C4 C7 anterior discectomy and fusion. He was then admitted on April 28 with an acute PE and DVT distal right posterior tibial and peroneal veins and left posterior tibial and peroneal veins. He was discharged on Rivaroxaban. On June 14 he had a lower extremity Doppler ordered in the ED after he presented for worsening left knee pain and swelling. It showed an acute DVT of the left posterior tibial vein. He was found to have filled Rivaroxaban which was stopped and he was to start Lovenox. There is confusion and he continued Rivaroxaban along with the Lovenox. He took both medications until June 27 when the confusion was discovered and Rivaroxaban was stopped. Lovenox was continued and warfarin was added 5 mg a day. On July 3, his INR was 1.4 and his warfarin was increased to 7.5 mg daily which she took on the third and fourth. On the 5th, he presented to his anticoagulation clinic where he was found to have an INR of greater than 8 and trembling, weak legs, and fatigue. His hemoglobin returned at 6.6 and he was sent to the ED for admission.  Patient was admitted for GI bleeding and acute anemia, managed appropriately and discharged as follows:  Disposition and follow-up:   Russell Carter was discharged from [facility] in Good condition.  At the hospital follow up visit please address:  1.   Anticoagulation for provoked PE/DVT: Russell Carter developed apparently spontaneous GI bleeding with a supratherapeutic INR> 8 after increase to his outpatient Coumadin. He was discharged to resume a new 5 day Lovenox bridge to Coumadin at the lower previous dose 5 mg daily. Please monitor INR and titrate appropriately--it is possible he may have a delayed response to Coumadin that led to supertherapeutic levels after titration up.  2.  Hypertension: His blood pressure was consistently low to normal during the admission and was discharged off of amlodipine, HCTZ, and losartan which he took PTA. Please resume some or all as appropriate.  3.  Anemia due to GI bleeding: Please re-check Hgb at follow up and ask if there is new hematochezia or melena.  4.  Deconditioning: Outpatient physical therapy was ordered at discharge, please ask if they are in contact or barriers are identified.  Patient is here today for follow up. He has no new complaint, INR today is 1.7. Patient claims to be doing well. Lovenox will be discontinued when INR is therapeutic. He denies any fever, leg swelling, chest pain, denies any more blood in stool, no blood in urine. No abdominal pain. No headache, no new weakness or tingling.   ALLERGIES: Allergies  Allergen Reactions  . Shellfish Allergy Anaphylaxis    PAST MEDICAL HISTORY: Past Medical History:  Diagnosis Date  . Anginal pain (HCC)   . Arthritis   . Blood in stool   . CAD (coronary artery disease)    a. 2011: cath  showing mod non-obstructive disease b. 02/2016: cath with 95% stenosis in the Mid RCA. DES placed.  . Cervical stenosis of spine   . Cocaine abuse    history  . Depression   . Diabetes mellitus without complication (HCC)   . Gout, unspecified   . Headache   . HTN (hypertension)   . Hypercholesterolemia   . Hyperglycemia   . Migraines   . Myocardial infarction [facility]) 2008   drug cocaine and marijuana at time of mi none  since    . Pneumonia     . Renal insufficiency   . Stroke [facility])     MEDICATIONS AT HOME: Prior to Admission medications   Medication Sig Start Date End Date Taking? Authorizing Provider  allopurinol (ZYLOPRIM) 300 MG tablet Take 1 tablet (300 mg total) by mouth daily. 05/02/17   Quentin Angst, MD  CALCIUM-VITAMIN D PO Take 1 tablet by mouth daily.    [provider]  clopidogrel (PLAVIX) 75 MG tablet Take 1 tablet (75 mg total) by mouth daily. 05/02/17   Quentin Angst, MD  docusate sodium (COLACE) 100 MG capsule Take 1 capsule (100 mg total) by mouth 2 (two) times daily. 02/12/17   Angiulli, Mcarthur Rossetti, PA-C  gabapentin (NEURONTIN) 400 MG capsule Take 2 capsules (800 mg total) by mouth 4 (four) times daily. 05/02/17   Quentin Angst, MD  methocarbamol (ROBAXIN) 750 MG tablet Take 1 tablet (750 mg total) by mouth every 8 (eight) hours as needed for muscle spasms. 05/02/17   Quentin Angst, MD  nitroGLYCERIN (NITROSTAT) 0.4 MG SL tablet Place 1 tablet (0.4 mg total) under the tongue every 5 (five) minutes as needed for chest pain. 05/02/17   Quentin Angst, MD  oxyCODONE-acetaminophen (PERCOCET) 7.5-325 MG tablet Take 1 tablet by mouth every 6 (six) hours as needed for severe pain.    Ditty, Loura Halt, MD  pantoprazole (PROTONIX) 40 MG tablet Take 1 tablet (40 mg total) by mouth at bedtime. 05/02/17   Quentin Angst, MD  rosuvastatin (CRESTOR) 40 MG tablet Take 1 tablet (40 mg total) by mouth at bedtime. 05/02/17 07/31/17  Quentin Angst, MD  traMADol (ULTRAM) 50 MG tablet Take 50 mg by mouth every 6 (six) hours. 05/01/17   [provider]  warfarin (COUMADIN) 5 MG tablet Take 1 tablet (5 mg total) by mouth daily. 05/02/17   Quentin Angst, MD    Objective:   Vitals:   05/01/17 1505  BP: 130/71  Pulse: 87  Resp: 18  Temp: 99 F (37.2 C)  TempSrc: Oral  SpO2: 98%  Weight: 78.8 kg (173 lb 12.8 oz)  Height: 5\' 11"  (1.803 m)   Exam General  appearance : Awake, alert, not in any distress. Speech Clear. Not toxic looking HEENT: Atraumatic and Normocephalic, pupils equally reactive to light and accomodation Neck: Supple, no JVD. No cervical lymphadenopathy.  Chest: Good air entry bilaterally, no added sounds  CVS: S1 S2 regular, no murmurs.  Abdomen: Bowel sounds present, Non tender and not distended with no gaurding, rigidity or rebound. Extremities: B/L Lower Ext shows no edema, both legs are warm to touch Neurology: Awake alert, and oriented X 3, CN II-XII intact, Non focal Skin: No Rash  Data Review Lab Results  Component Value Date   HGBA1C 4.9 05/01/2017   HGBA1C 6.2 (H) 01/21/2017   HGBA1C 5.9 (H) 03/13/2016    Assessment & Plan   1. Type 2 diabetes mellitus with hyperglycemia, without  long-term current use of insulin (HCC)  - POCT UA - Microalbumin - Glucose (CBG) - POCT A1C  2. Fatigue associated with anemia  - CBC with Differential - INR  Patient have been counseled extensively about nutrition and exercise. Other issues discussed during this visit include: low cholesterol diet, weight control and daily exercise, foot care, annual eye examinations at Ophthalmology, importance of adherence with medications and regular follow-up. We also discussed long term complications of uncontrolled diabetes and hypertension.   Return in about 4 weeks (around 05/29/2017) for Pulmonary Embolism and Anticoagulation.  The patient was given clear instructions to go to ER or return to medical center if symptoms don't improve, worsen or new problems develop. The patient verbalized understanding. The patient was told to call to get lab results if they haven't heard anything in the next week.   This note has been created with Education officer, environmental. Any transcriptional errors are unintentional.    Jeanann Lewandowsky, MD, MHA, Maxwell Caul, CPE [facility]  [place], [place] [phone]   05/12/2017, 5:47 PM

## 2017-05-15 ENCOUNTER — Emergency Department (HOSPITAL_COMMUNITY)
Admission: EM | Admit: 2017-05-15 | Discharge: 2017-05-15 | Disposition: A | Discharge status: Home / Self Care | Payer: Medicaid Other | Attending: Physician Assistant | Admitting: Physician Assistant

## 2017-05-15 ENCOUNTER — Encounter (HOSPITAL_COMMUNITY): Payer: Self-pay | Admitting: Nurse Practitioner

## 2017-05-15 ENCOUNTER — Emergency Department (HOSPITAL_COMMUNITY): Payer: Medicaid Other

## 2017-05-15 ENCOUNTER — Ambulatory Visit: Payer: Medicaid Other | Attending: Internal Medicine | Admitting: Pharmacist

## 2017-05-15 DIAGNOSIS — I129 Hypertensive chronic kidney disease with stage 1 through stage 4 chronic kidney disease, or unspecified chronic kidney disease: Secondary | ICD-10-CM | POA: Diagnosis not present

## 2017-05-15 DIAGNOSIS — R55 Syncope and collapse: Secondary | ICD-10-CM | POA: Diagnosis not present

## 2017-05-15 DIAGNOSIS — I252 Old myocardial infarction: Secondary | ICD-10-CM | POA: Insufficient documentation

## 2017-05-15 DIAGNOSIS — R52 Pain, unspecified: Secondary | ICD-10-CM | POA: Diagnosis not present

## 2017-05-15 DIAGNOSIS — N183 Chronic kidney disease, stage 3 (moderate): Secondary | ICD-10-CM | POA: Diagnosis not present

## 2017-05-15 DIAGNOSIS — R531 Weakness: Secondary | ICD-10-CM | POA: Diagnosis present

## 2017-05-15 DIAGNOSIS — M542 Cervicalgia: Secondary | ICD-10-CM | POA: Diagnosis not present

## 2017-05-15 DIAGNOSIS — R200 Anesthesia of skin: Secondary | ICD-10-CM | POA: Insufficient documentation

## 2017-05-15 DIAGNOSIS — Z79899 Other long term (current) drug therapy: Secondary | ICD-10-CM | POA: Insufficient documentation

## 2017-05-15 DIAGNOSIS — Z8673 Personal history of transient ischemic attack (TIA), and cerebral infarction without residual deficits: Secondary | ICD-10-CM | POA: Insufficient documentation

## 2017-05-15 DIAGNOSIS — Z7902 Long term (current) use of antithrombotics/antiplatelets: Secondary | ICD-10-CM | POA: Diagnosis not present

## 2017-05-15 DIAGNOSIS — Z7901 Long term (current) use of anticoagulants: Secondary | ICD-10-CM | POA: Insufficient documentation

## 2017-05-15 DIAGNOSIS — E119 Type 2 diabetes mellitus without complications: Secondary | ICD-10-CM | POA: Insufficient documentation

## 2017-05-15 DIAGNOSIS — I2510 Atherosclerotic heart disease of native coronary artery without angina pectoris: Secondary | ICD-10-CM | POA: Insufficient documentation

## 2017-05-15 DIAGNOSIS — I2782 Chronic pulmonary embolism: Secondary | ICD-10-CM

## 2017-05-15 LAB — URINALYSIS, ROUTINE W REFLEX MICROSCOPIC
Bilirubin Urine: NEGATIVE
Glucose, UA: NEGATIVE mg/dL
Hgb urine dipstick: NEGATIVE
Ketones, ur: NEGATIVE mg/dL
LEUKOCYTES UA: NEGATIVE
NITRITE: NEGATIVE
PH: 6 (ref 5.0–8.0)
Protein, ur: NEGATIVE mg/dL
SPECIFIC GRAVITY, URINE: 1.019 (ref 1.005–1.030)

## 2017-05-15 LAB — BASIC METABOLIC PANEL
ANION GAP: 9 (ref 5–15)
BUN: 13 mg/dL (ref 6–20)
CO2: 22 mmol/L (ref 22–32)
Calcium: 8.9 mg/dL (ref 8.9–10.3)
Chloride: 108 mmol/L (ref 101–111)
Creatinine, Ser: 1.11 mg/dL (ref 0.61–1.24)
GLUCOSE: 87 mg/dL (ref 65–99)
POTASSIUM: 3.7 mmol/L (ref 3.5–5.1)
SODIUM: 139 mmol/L (ref 135–145)

## 2017-05-15 LAB — CBC
HEMATOCRIT: 32.6 % — AB (ref 39.0–52.0)
HEMOGLOBIN: 9.9 g/dL — AB (ref 13.0–17.0)
MCH: 26.1 pg (ref 26.0–34.0)
MCHC: 30.4 g/dL (ref 30.0–36.0)
MCV: 85.8 fL (ref 78.0–100.0)
Platelets: 271 10*3/uL (ref 150–400)
RBC: 3.8 MIL/uL — AB (ref 4.22–5.81)
RDW: 18.4 % — ABNORMAL HIGH (ref 11.5–15.5)
WBC: 5.2 10*3/uL (ref 4.0–10.5)

## 2017-05-15 LAB — POCT INR: INR: 1.7

## 2017-05-15 LAB — I-STAT TROPONIN, ED: Troponin i, poc: 0 ng/mL (ref 0.00–0.08)

## 2017-05-15 LAB — CBG MONITORING, ED: GLUCOSE-CAPILLARY: 107 mg/dL — AB (ref 65–99)

## 2017-05-15 MED ORDER — GADOBENATE DIMEGLUMINE 529 MG/ML IV SOLN
18.0000 mL | Freq: Once | INTRAVENOUS | Status: AC | PRN
  Administered 2017-05-15: 18 mL via INTRAVENOUS

## 2017-05-15 MED ORDER — MORPHINE SULFATE (PF) 4 MG/ML IV SOLN
4.0000 mg | Freq: Once | INTRAVENOUS | Status: AC
  Administered 2017-05-15: 4 mg via INTRAVENOUS
  Filled 2017-05-15: qty 1

## 2017-05-15 MED ORDER — OXYCODONE-ACETAMINOPHEN 5-325 MG PO TABS
1.0000 | ORAL_TABLET | Freq: Once | ORAL | Status: AC
  Administered 2017-05-15: 1 via ORAL
  Filled 2017-05-15: qty 1

## 2017-05-15 MED ORDER — ONDANSETRON HCL 4 MG/2ML IJ SOLN
4.0000 mg | Freq: Once | INTRAMUSCULAR | Status: AC
  Administered 2017-05-15: 4 mg via INTRAVENOUS
  Filled 2017-05-15: qty 2

## 2017-05-15 MED ORDER — HYDROMORPHONE HCL 1 MG/ML IJ SOLN
1.0000 mg | Freq: Once | INTRAMUSCULAR | Status: AC
  Administered 2017-05-15: 1 mg via INTRAVENOUS
  Filled 2017-05-15: qty 1

## 2017-05-15 NOTE — Discharge Instructions (Signed)
Please use stretching, heat, lidocaine patches to help with your pain.

## 2017-05-15 NOTE — ED Notes (Signed)
To x-ray

## 2017-05-15 NOTE — ED Provider Notes (Signed)
MSE was initiated and I personally evaluated the patient and placed orders (if any) at  10:46 AM on May 15, 2017.  Patient is a 61 year old male with history of hypertension, CAD, diabetes, PE, recent cervical thoracic spine surgery who presents with acute onset weakness that began this morning. Patient reports feeling at his baseline yesterday. Patient reports he is weak all over, however especially on the left side. He reports complete numbness on his left side, upper and lower extremities, and tingling on his right upper extremity. Patient reports he is very off balance to try and walk. [facility] checked today. His INR was 1.7. Patient reports to be again also having severe pain in his upper back at the site of his surgery that he had in April. Patient reports having a plate placed in his thoracic spine. Patient reports he has had intermittent pain since his surgery, but not as here today. He has pain radiating to his left arm. Patient also has severe pain in the back of his head that is making him feel nauseated. Patient also reports a pleuritic chest pain that began today as well. He denies shortness of breath, abdominal pain, vomiting. Patient notes that he has had trickling of his urine for the past 3 days. He denies bowel or urinary incontinence, saddle anesthesia. Patient denies any recurrent melena or bloody stools. Patient was discharged around 2 weeks ago after a GI bleed suspected to be caused by Lovenox. His hemoglobin at that time was 6.6 and he received transfusion. Patient's hemoglobin today is 9.9, much improved since his last several hemoglobin results.  PE: Patient has thoracic and cervical midline spinal tenderness over his scar. Patient reports complete numbness in his left upper and lower extremities decreased grip strength on the left side. Patient will not move his left lower extremity at all. Decreased sensation on the left side  of the face. Decreased breath sounds.  The patient appears stable so that the remainder of the MSE may be completed by Dr. Verdie Mosher, attending physician.   Emi Holes, PA-C 05/15/17 1706    Lavera Guise, MD 05/15/17 [phone]

## 2017-05-15 NOTE — ED Notes (Signed)
Pt returned from MRI °

## 2017-05-15 NOTE — ED Provider Notes (Signed)
[facility]-EMERGENCY DEPT Provider Note   CSN: [id number] Arrival date & time: 05/15/17  4098     History   Chief Complaint Chief Complaint  Patient presents with  . Weakness    HPI Russell Carter is a 61 y.o. male.  HPI 61 year old male who presents with left-sided numbness and weakness and neck pain. He has a history of hypertension, diabetes, CAD, and previous stroke. Has recent history of cervical fusion by Dr. Bevely Palmer in April 2018. States that since his surgery he has had ongoing neck pain, but today while standing and lying to go see his primary care doctor he developed sudden onset of worsening pain in the neck that radiated down the left arm. States it was associated with onset of left arm, left leg, and left facial numbness. Denies any fall or trauma, fevers or chills, bowel or urinary incontinence.   Past Medical History:  Diagnosis Date  . Anginal pain (HCC)   . Arthritis   . Blood in stool   . CAD (coronary artery disease)    a. 2011: cath showing mod non-obstructive disease b. 02/2016: cath with 95% stenosis in the Mid RCA. DES placed.  . Cervical stenosis of spine   . Cocaine abuse    history  . Depression   . Diabetes mellitus without complication (HCC)   . Gout, unspecified   . Headache   . HTN (hypertension)   . Hypercholesterolemia   . Hyperglycemia   . Migraines   . Myocardial infarction [facility]) 2008   drug cocaine and marijuana at time of mi none  since    . Pneumonia   . Renal insufficiency   . Stroke [facility])     Patient Active Problem List   Diagnosis Date Noted  . Elevated INR 04/27/2017  . Gastrointestinal hemorrhage with melena   . Blood loss anemia 04/25/2017  . Pulmonary embolism (HCC) 02/14/2017  . Fatigue associated with anemia   . Post-operative pain   . Radicular pain   . Diabetes mellitus type 2 in nonobese (HCC)   . Surgery, elective   . Postoperative pain   . Leukocytosis   . Thrombocytopenia (HCC)   . History of myocardial infarction    . Cocaine abuse   . Depression   . Type 2 diabetes mellitus with hyperglycemia, without long-term current use of insulin (HCC)   . Stage 3 chronic kidney disease   . Benign essential HTN   . History of CVA (cerebrovascular accident)   . Prediabetes   . Cervical spondylosis with radiculopathy 01/24/2017  . Dyslipidemia 03/15/2016  . Unstable angina (HCC) - chest pain with complete heart block & chest pain 03/15/2016  . Complete heart block (HCC) 03/14/2016  . Cervical spinal stenosis 03/14/2016  . Chest pain with moderate risk of acute coronary syndrome 03/13/2016  . CAD-50% LAD 2011 12/14/2014  . Gout 11/16/2014  . Headache 07/15/2013  . Polysubstance abuse 01/15/2013  . DIZZINESS 06/05/2010  . Essential hypertension 05/18/2009  . Acute myocardial infarction (HCC) 05/18/2009  . MUSCLE SPASM 05/18/2009    Past Surgical History:  Procedure Laterality Date  . ANTERIOR CERVICAL DECOMP/DISCECTOMY FUSION N/A 01/24/2017   Procedure: Cervical Four-Five,Cervical Five Six,Cervical Six-Seven Anterior cervical discectomy with fusion and plate with Cervical Three-Thoracic- One Laminectomy/Foraminotomy with Cervical Three-Thoracic- One Fixation and fusion;  Surgeon: Loura Halt Ditty, MD;  Location: [facility] OR;  Service: Neurosurgery;  Laterality: N/A;  Part-1 anterior approach  . CARDIAC CATHETERIZATION  2008; ~ 2011   [place]-40%lad  . CARDIAC  CATHETERIZATION N/A 03/16/2016   Procedure: Left Heart Cath and Coronary Angiography;  Surgeon: Marykay Lex, MD;  Location: [facility] INVASIVE CV LAB;  Service: Cardiovascular;  Laterality: N/A;  . CORONARY ANGIOPLASTY     STENTS  . EYE SURGERY Bilateral    "for double vision"  . INGUINAL HERNIA REPAIR  05/12/2012   Procedure: HERNIA REPAIR INGUINAL ADULT;  Surgeon: Liz Malady, MD;  Location: [facility];  Service: General;  Laterality: Right;  . INGUINAL HERNIA REPAIR Left 02/02/2003   Dr. Violeta Gelinas. repair with mesh. Same procedure  done again in  05/22/2004   . INGUINAL HERNIA REPAIR Left ?date 2nd OR  . POSTERIOR CERVICAL FUSION/FORAMINOTOMY N/A 01/24/2017   Procedure: Cervical Three-Thoracic One-Laminectomy/Foraminotomy with Cervical Three-Thoracic One Fixation and fusion;  Surgeon: Loura Halt Ditty, MD;  Location: [facility] OR;  Service: Neurosurgery;  Laterality: N/A;  Part-2 Posterior approach  . RECONSTRUCT / STABILIZE DISTAL ULNA    . SHOULDER OPEN ROTATOR CUFF REPAIR Left 2010  . SHOULDER SURGERY Left    "injured on the job; had to cut small bone out"  . THUMB FUSION Right        Home Medications    Prior to Admission medications   Medication Sig Start Date End Date Taking? Authorizing Provider  allopurinol (ZYLOPRIM) 300 MG tablet Take 1 tablet (300 mg total) by mouth daily. 05/02/17   Quentin Angst, MD  CALCIUM-VITAMIN D PO Take 1 tablet by mouth daily.    [provider]  clopidogrel (PLAVIX) 75 MG tablet Take 1 tablet (75 mg total) by mouth daily. 05/02/17   Quentin Angst, MD  docusate sodium (COLACE) 100 MG capsule Take 1 capsule (100 mg total) by mouth 2 (two) times daily. 02/12/17   Angiulli, Mcarthur Rossetti, PA-C  gabapentin (NEURONTIN) 400 MG capsule Take 2 capsules (800 mg total) by mouth 4 (four) times daily. 05/02/17   Quentin Angst, MD  methocarbamol (ROBAXIN) 750 MG tablet Take 1 tablet (750 mg total) by mouth every 8 (eight) hours as needed for muscle spasms. 05/02/17   Quentin Angst, MD  nitroGLYCERIN (NITROSTAT) 0.4 MG SL tablet Place 1 tablet (0.4 mg total) under the tongue every 5 (five) minutes as needed for chest pain. 05/02/17   Quentin Angst, MD  oxyCODONE-acetaminophen (PERCOCET) 7.5-325 MG tablet Take 1 tablet by mouth every 6 (six) hours as needed for severe pain.    Ditty, Loura Halt, MD  pantoprazole (PROTONIX) 40 MG tablet Take 1 tablet (40 mg total) by mouth at bedtime. 05/02/17   Quentin Angst, MD  rosuvastatin (CRESTOR) 40 MG tablet Take 1  tablet (40 mg total) by mouth at bedtime. 05/02/17 07/31/17  Quentin Angst, MD  traMADol (ULTRAM) 50 MG tablet Take 50 mg by mouth every 6 (six) hours. 05/01/17   [provider]  warfarin (COUMADIN) 5 MG tablet Take 1 tablet (5 mg total) by mouth daily. 05/02/17   Quentin Angst, MD    Family History Family History  Problem Relation Age of Onset  . Heart attack Mother        x7. Still alive  . Heart attack Father        deceased b/c of MI  . Heart attack Brother        older, deceased b/c of MI   . Cancer Brother        bone cancer in 1 brother, ? brain cancer in other brother  Social History Social History  Substance Use Topics  . Smoking status: Never Smoker  . Smokeless tobacco: Never Used  . Alcohol use Yes     Comment: 16 oz beer every 2-3 days     Allergies   Shellfish allergy   Review of Systems Review of Systems  Constitutional: Negative for fever.  Respiratory: Negative for cough and shortness of breath.   Cardiovascular: Negative for chest pain.  Musculoskeletal: Positive for neck pain.  Neurological: Positive for weakness and numbness. Negative for speech difficulty.  All other systems reviewed and are negative.    Physical Exam Updated Vital Signs BP (!) 133/95   Pulse 65   Temp 98 F (36.7 C) (Oral)   Resp 14   Ht 5\' 11"  (1.803 m)   Wt 78.5 kg (173 lb)   SpO2 98%   BMI 24.13 kg/m   Physical Exam Physical Exam  Nursing note and vitals reviewed. Constitutional:  non-toxic, and in no acute distress Head: Normocephalic and atraumatic.  Mouth/Throat: Oropharynx is clear and moist.  Neck: Normal range of motion. Neck supple. tenderness along cervical spine without overlying skin changes Cardiovascular: Normal rate and regular rhythm.   Pulmonary/Chest: Effort normal and breath sounds normal.  Abdominal: Soft. There is no tenderness. There is no rebound and no guarding.  Musculoskeletal: Normal range of motion but pain with  any ROM of the LUE or LLE diffusely.  Neurological: Alert, no facial droop, fluent speech, PERRL, EOMI, sensation reported me is diminished over the left side of the face, left upper extremity, left lower extremity. +1 lower extremity deep tendon reflexes, normal strength of the right upper and lower extremities. Patient reports inability to move the left lower extremity, or left upper extremity, however with passive range of motion, he does resist. Patient also is able to hold up the left leg against the right leg against gravity while trying to have him attempt heel-knee-shin.  Skin: Skin is warm and dry.  Psychiatric: Cooperative   ED Treatments / Results  Labs (all labs ordered are listed, but only abnormal results are displayed) Labs Reviewed  CBC - Abnormal; Notable for the following:       Result Value   RBC 3.80 (*)    Hemoglobin 9.9 (*)    HCT 32.6 (*)    RDW 18.4 (*)    All other components within normal limits  CBG MONITORING, ED - Abnormal; Notable for the following:    Glucose-Capillary 107 (*)    All other components within normal limits  BASIC METABOLIC PANEL  URINALYSIS, ROUTINE W REFLEX MICROSCOPIC  I-STAT TROPONIN, ED    EKG  EKG Interpretation  Date/Time:  Wednesday May 15 2017 10:09:43 EDT Ventricular Rate:  64 PR Interval:    QRS Duration: 90 QT Interval:  389 QTC Calculation: 402 R Axis:   30 Text Interpretation:  Sinus rhythm Prolonged PR interval no acute changes  Confirmed by Crista Curb [phone]) on 05/15/2017 11:13:54 AM       Radiology Dg Chest 2 View  Result Date: 05/15/2017 CLINICAL DATA:  Chest pain. EXAM: CHEST  2 VIEW COMPARISON:  03/12/2017 .  CT 02/14/2017. FINDINGS: Cervical spine fusion . Mediastinum and hilar structures are normal. Heart size normal . Bibasilar atelectasis. No pleural effusion or pneumothorax . IMPRESSION: Low lung volumes with mild bibasilar atelectasis and/or scarring. Electronically Signed   By: Maisie Fus  Register   On:  05/15/2017 11:31   Ct Head Wo Contrast  Result Date: 05/15/2017 CLINICAL  DATA:  Loss of consciousness and trembling in clinic. Weakness and numbness. EXAM: CT HEAD WITHOUT CONTRAST CT CERVICAL SPINE WITHOUT CONTRAST TECHNIQUE: Multidetector CT imaging of the head and cervical spine was performed following the standard protocol without intravenous contrast. Multiplanar CT image reconstructions of the cervical spine were also generated. COMPARISON:  Head and cervical spine CT 04/25/2017. Cervical spine MRI 01/27/2017. FINDINGS: CT HEAD FINDINGS Brain: There is no evidence of acute infarct, intracranial hemorrhage, mass, midline shift, or extra-axial fluid collection. The ventricles and sulci are within normal limits for age. Periventricular white matter hypodensities are similar to the prior CT and nonspecific but compatible with mild chronic small vessel ischemic disease. Vascular: No hyperdense vessel. Skull: No fracture or focal osseous lesion. Sinuses/Orbits: Paranasal sinuses and mastoid air cells are clear. Unremarkable orbits. Other: None. CT CERVICAL SPINE FINDINGS Alignment: Cervical spine straightening.  No listhesis. Skull base and vertebrae: Sequelae of C4-C7 ACDF are again identified with associated extensive streak artifact related to the interbody implants. C3-T1 posterior instrumented fusion is also again noted. No evidence of screw loosening. There is left facet ankylosis at C3-4. Solid posterior osseous fusion is not clearly identified elsewhere. Within limitations of artifact, no acute fracture or destructive osseous process is identified. Soft tissues and spinal canal: No gross prevertebral soft tissue swelling within limitations of artifact. Disc levels: Anterior and posterior fusion changes as above with C4-C7 laminectomies. Limited assessment of the spinal canal due to artifact. Upper chest: Clear lung apices. Other: Mild calcified atherosclerosis at the left carotid bifurcation. IMPRESSION:  1. No evidence of acute intracranial abnormality. 2. Mild chronic small vessel ischemic disease. 3. Postoperative changes in the cervical spine without evidence of acute osseous abnormality. Electronically Signed   By: Sebastian Ache M.D.   On: 05/15/2017 12:32   Ct Cervical Spine Wo Contrast  Result Date: 05/15/2017 CLINICAL DATA:  Loss of consciousness and trembling in clinic. Weakness and numbness. EXAM: CT HEAD WITHOUT CONTRAST CT CERVICAL SPINE WITHOUT CONTRAST TECHNIQUE: Multidetector CT imaging of the head and cervical spine was performed following the standard protocol without intravenous contrast. Multiplanar CT image reconstructions of the cervical spine were also generated. COMPARISON:  Head and cervical spine CT 04/25/2017. Cervical spine MRI 01/27/2017. FINDINGS: CT HEAD FINDINGS Brain: There is no evidence of acute infarct, intracranial hemorrhage, mass, midline shift, or extra-axial fluid collection. The ventricles and sulci are within normal limits for age. Periventricular white matter hypodensities are similar to the prior CT and nonspecific but compatible with mild chronic small vessel ischemic disease. Vascular: No hyperdense vessel. Skull: No fracture or focal osseous lesion. Sinuses/Orbits: Paranasal sinuses and mastoid air cells are clear. Unremarkable orbits. Other: None. CT CERVICAL SPINE FINDINGS Alignment: Cervical spine straightening.  No listhesis. Skull base and vertebrae: Sequelae of C4-C7 ACDF are again identified with associated extensive streak artifact related to the interbody implants. C3-T1 posterior instrumented fusion is also again noted. No evidence of screw loosening. There is left facet ankylosis at C3-4. Solid posterior osseous fusion is not clearly identified elsewhere. Within limitations of artifact, no acute fracture or destructive osseous process is identified. Soft tissues and spinal canal: No gross prevertebral soft tissue swelling within limitations of artifact.  Disc levels: Anterior and posterior fusion changes as above with C4-C7 laminectomies. Limited assessment of the spinal canal due to artifact. Upper chest: Clear lung apices. Other: Mild calcified atherosclerosis at the left carotid bifurcation. IMPRESSION: 1. No evidence of acute intracranial abnormality. 2. Mild chronic small vessel ischemic disease. 3.  Postoperative changes in the cervical spine without evidence of acute osseous abnormality. Electronically Signed   By: Sebastian Ache M.D.   On: 05/15/2017 12:32   Ct Thoracic Spine Wo Contrast  Result Date: 05/15/2017 CLINICAL DATA:  Weakness and numbness. EXAM: CT THORACIC SPINE WITHOUT CONTRAST TECHNIQUE: Multidetector CT images of the thoracic were obtained using the standard protocol without intravenous contrast. COMPARISON:  Chest CTA 02/14/2017 FINDINGS: Alignment: Normal. Vertebrae: No evidence of acute fracture or destructive osseous process. Anterior cervical spine fusion changes extending to C7 and posterior fusion changes extending to T1, more fully evaluated on separate cervical spine CT. Paraspinal and other soft tissues: Left lung base atelectasis. Punctate nonobstructing calculus in the upper pole of the left kidney. Coronary artery atherosclerosis. Disc levels: Mild thoracic spondylosis. Detailed assessment of degenerative changes is limited on this non-myelographic examination. Mild disc bulging is evident at T6-7, T8-9, and T9-10 without gross spinal stenosis. There is more extensive circumferential disc bulging at T10-11 resulting in mild spinal stenosis. Mild facet arthrosis and foraminal endplate spurring at T10-11 contribute to moderate to severe biforaminal stenosis. Spurring at T1-2 results in mild-to-moderate right neural foraminal stenosis. There is mild disc bulging and mild-to-moderate facet hypertrophy at T12-L1 without gross stenosis. IMPRESSION: 1. No evidence of acute osseous abnormality. 2. Thoracic spondylosis, most notable at  T10-11 where there is mild spinal stenosis and moderate to severe biforaminal stenosis. Electronically Signed   By: Sebastian Ache M.D.   On: 05/15/2017 12:44    Procedures Procedures (including critical care time)  Medications Ordered in ED Medications  morphine 4 MG/ML injection 4 mg (4 mg Intravenous Given 05/15/17 1229)  HYDROmorphone (DILAUDID) injection 1 mg (1 mg Intravenous Given 05/15/17 1309)  ondansetron (ZOFRAN) injection 4 mg (4 mg Intravenous Given 05/15/17 1309)     Initial Impression / Assessment and Plan / ED Course  I have reviewed the triage vital signs and the nursing notes.  Pertinent labs & imaging results that were available during my care of the patient were reviewed by me and considered in my medical decision making (see chart for details).     Presents with sudden onset of worsening neck pain with left facial, left arm and left leg numbness and weakness. The patient with inconsistent neurological exam in regards to his motor exam. Also complaining of lack of sensation to the left side of the body. Discussed with Dr. Jerrell Belfast from neurology. He recommended obtaining MRI of the brain and cervical spine, and MRA of the head and neck. If these findings are normal, he would be felt to be stable for discharge home. Signed out to Dr. Corlis Leak.   Final Clinical Impressions(s) / ED Diagnoses   Final diagnoses:  None    New Prescriptions New Prescriptions   No medications on file     Lavera Guise, MD 05/15/17 1701

## 2017-05-15 NOTE — Progress Notes (Addendum)
    Pharmacy Anticoagulation Clinic  Subjective: Patient presents today for INR monitoring. Anticoagulation indication is PE.   Current dose of warfarin: 5 mg daily  Adherence to warfarin: denies any missed doses. He denies any blood in the stool or urine or any other s/sx of bleeding.   Patient presents to clinic with pain. He requests a prescription for oxycodone because the pain has prevented him from sleeping. At the end of the visit, patient starts having loss of consciousness and trembling.   Objective: Today's INR = 1.7  Lab Results  Component Value Date   INR 1.7 05/15/2017   INR 2.9 05/08/2017   INR 2.8 05/07/2017     Assessment and Plan: Anticoagulation: Patient is subtherapeutic. He denies any missed doses but patient can be poor historian. Having symptoms of loss of consciousness and trembling in clinic. No difficulty breathing, no abnormal breathing, no need for CPR. Care provided until EMS arrived. Nurse called EMS and patient transferred to ED.

## 2017-05-15 NOTE — ED Notes (Signed)
Contacted MRI, MRI informed RN it will be approximately another hour before MRI to be done. Pt requesting something to eat. MD Liu states pt may not eat until MRI results

## 2017-05-28 ENCOUNTER — Telehealth: Payer: Self-pay | Admitting: Internal Medicine

## 2017-05-28 NOTE — Telephone Encounter (Signed)
Patient called requesting Oxycodone because the tramadol is not helping.  Please follow with patient.

## 2017-05-30 NOTE — Telephone Encounter (Signed)
Will forward to pcp

## 2017-05-30 NOTE — Telephone Encounter (Signed)
Will route to nurse °

## 2017-05-31 ENCOUNTER — Telehealth: Payer: Self-pay | Admitting: Physical Medicine & Rehabilitation

## 2017-05-31 NOTE — Telephone Encounter (Signed)
I spoke with him and directed him to call Dr Ditty's office. He says Dr Bevely Palmer said he would have to call our office.  We have not been prescribing and can not refill his medication without appointment and Russell Carter and Dr Russell Carter are out of office today so I cannot say as to whether there will be plan to prescribe.  His appt is 06/13/17 with Russell Carter.  He says he will try and make it to that time "though he should not have to be in pain".

## 2017-05-31 NOTE — Telephone Encounter (Signed)
I found in the system that Russell Carter was prescribed oxycodone x1 by Dr Riley Kill on 03/25/17 and signed a controlled substance agreement with our office. No UDS was collected at that time. But he went and to Dr Ditty's office and received several prescriptions past that date(starting on 04/15/17 by [facility] which is a violation of the CSA. Dr Riley Kill will have to make a decision if this patient is to receive any further Rx's for narcotics from this office.

## 2017-05-31 NOTE — Telephone Encounter (Signed)
Patient is requesting a refill on Oxycodone 7.5mg .  Please call patient.

## 2017-06-03 NOTE — Telephone Encounter (Signed)
Either Riley Lam or I will review with him at his next follow up visit. No further narcotic rx'es without an appt and discussion

## 2017-06-05 ENCOUNTER — Ambulatory Visit: Payer: Medicaid Other | Attending: Internal Medicine | Admitting: Internal Medicine

## 2017-06-05 VITALS — BP 147/83 | HR 83 | Temp 98.5°F | Resp 18 | Ht 71.0 in | Wt 185.0 lb

## 2017-06-05 DIAGNOSIS — E1122 Type 2 diabetes mellitus with diabetic chronic kidney disease: Secondary | ICD-10-CM | POA: Insufficient documentation

## 2017-06-05 DIAGNOSIS — E785 Hyperlipidemia, unspecified: Secondary | ICD-10-CM | POA: Insufficient documentation

## 2017-06-05 DIAGNOSIS — Z79899 Other long term (current) drug therapy: Secondary | ICD-10-CM | POA: Insufficient documentation

## 2017-06-05 DIAGNOSIS — I129 Hypertensive chronic kidney disease with stage 1 through stage 4 chronic kidney disease, or unspecified chronic kidney disease: Secondary | ICD-10-CM | POA: Insufficient documentation

## 2017-06-05 DIAGNOSIS — G894 Chronic pain syndrome: Secondary | ICD-10-CM | POA: Insufficient documentation

## 2017-06-05 DIAGNOSIS — F329 Major depressive disorder, single episode, unspecified: Secondary | ICD-10-CM | POA: Diagnosis not present

## 2017-06-05 DIAGNOSIS — Z86711 Personal history of pulmonary embolism: Secondary | ICD-10-CM | POA: Insufficient documentation

## 2017-06-05 DIAGNOSIS — G8929 Other chronic pain: Secondary | ICD-10-CM | POA: Insufficient documentation

## 2017-06-05 DIAGNOSIS — N189 Chronic kidney disease, unspecified: Secondary | ICD-10-CM | POA: Diagnosis not present

## 2017-06-05 DIAGNOSIS — D689 Coagulation defect, unspecified: Secondary | ICD-10-CM | POA: Diagnosis present

## 2017-06-05 DIAGNOSIS — E1165 Type 2 diabetes mellitus with hyperglycemia: Secondary | ICD-10-CM

## 2017-06-05 DIAGNOSIS — Z7901 Long term (current) use of anticoagulants: Secondary | ICD-10-CM | POA: Insufficient documentation

## 2017-06-05 DIAGNOSIS — I2510 Atherosclerotic heart disease of native coronary artery without angina pectoris: Secondary | ICD-10-CM | POA: Insufficient documentation

## 2017-06-05 DIAGNOSIS — Z8673 Personal history of transient ischemic attack (TIA), and cerebral infarction without residual deficits: Secondary | ICD-10-CM | POA: Insufficient documentation

## 2017-06-05 DIAGNOSIS — I252 Old myocardial infarction: Secondary | ICD-10-CM | POA: Diagnosis not present

## 2017-06-05 HISTORY — DX: Chronic pain syndrome: G89.4

## 2017-06-05 MED ORDER — CARISOPRODOL 350 MG PO TABS: 350.0000 mg | ORAL_TABLET | Freq: Four times a day (QID) | ORAL | 0 refills | Status: DC | PRN

## 2017-06-05 MED ORDER — ACETAMINOPHEN-CODEINE #3 300-30 MG PO TABS: 1.0000 | ORAL_TABLET | ORAL | 0 refills | Status: DC | PRN

## 2017-06-05 MED FILL — METHOCARBAMOL 750 MG TABLET: 750 | 10 days supply | Qty: 30 | Fill #0

## 2017-06-05 MED FILL — ACETAMINOPHEN/COD #3 TABLET: 300-30 | 10 days supply | Qty: 60 | Fill #0

## 2017-06-05 MED FILL — GABAPENTIN 400 MG CAPSULE: 400 | 30 days supply | Qty: 240 | Fill #0

## 2017-06-05 NOTE — Patient Instructions (Signed)
Pulmonary Embolism A pulmonary embolism (PE) is a sudden blockage or decrease of blood flow in one lung or both lungs. Most blockages come from a blood clot that travels from the legs or the pelvis to the lungs. PE is a dangerous and potentially life-threatening condition if it is not treated right away. What are the causes? A pulmonary embolism occurs most commonly when a blood clot travels from one of your veins to your lungs. Rarely, PE is caused by air, fat, amniotic fluid, or part of a tumor traveling through your veins to your lungs. What increases the risk? A PE is more likely to develop in:  People who smoke.  People who areolder, especially over 17 years of age.  People who are overweight (obese).  People who sit or lie still for a long time, such as during long-distance travel (over 4 hours), bed rest, hospitalization, or during recovery from certain medical conditions like a stroke.  People who do not engage in much physical activity (sedentary lifestyle).  People who have chronic breathing disorders.  People whohave a personal or family history of blood clots or blood clotting disease.  People whohave peripheral vascular disease (PVD), diabetes, or some types of cancer.  People who haveheart disease, especially if the person had a recent heart attack or has congestive heart failure.  People who have neurological diseases that affect the legs (leg paresis).  People who have had a traumatic injury, such as breaking a hip or leg.  People whohave recently had major or lengthy surgery, especially on the hip, knee, or abdomen.  People who have hada central line placed inside a large vein.  People who takemedicines that contain the hormone estrogen. These include birth control pills and hormone replacement therapy.  Pregnancy or during childbirth or the postpartum period.  What are the signs or symptoms? The symptoms of a PE usually start suddenly and  include:  Shortness of breath while active or at rest.  Coughing or coughing up blood or blood-tinged mucus.  Chest pain that is often worse with deep breaths.  Rapid or irregular heartbeat.  Feeling light-headed or dizzy.  Fainting.  Feelinganxious.  Sweating.  There may also be pain and swelling in a leg if that is where the blood clot started. These symptoms may represent a serious problem that is an emergency. Do not wait to see if the symptoms will go away. Get medical help right away. Call your local emergency services (911 in the [place] Do not drive yourself to the hospital. How is this diagnosed? Your health care provider will take a medical history and perform a physical exam. You may also have other tests, including:  Blood tests to assess the clotting properties of your blood, assess oxygen levels in your blood, and find blood clots.  Imaging tests, such as CT, ultrasound, MRI, X-ray, and other tests to see if you have clots anywhere in your body.  An electrocardiogram (ECG) to look for heart strain from blood clots in the lungs.  How is this treated? The main goals of PE treatment are:  To stop a blood clot from growing larger.  To stop new blood clots from forming.  The type of treatment that you receive depends on many factors, such as the cause of your PE, your risk for bleeding or developing more clots, and other medical conditions that you have. Sometimes, a combination of treatments is necessary. This condition may be treated with:  Medicines, including newer oral blood thinners (  anticoagulants), warfarin, low molecular weight heparins, thrombolytics, or heparins.  Wearing compression stockings or using different types of devices.  Surgery (rare) to remove the blood clot or to place a filter in your abdomen to stop the blood clot from traveling to your lungs.  Treatments for a PE are often divided into immediate treatment, long-term treatment (up to 3  months after PE), and extended treatment (more than 3 months after PE). Your treatment may continue for several months. This is called maintenance therapy, and it is used to prevent the forming of new blood clots. You can work with your health care provider to choose the treatment program that is best for you. What are anticoagulants? Anticoagulants are medicines that treat PEs. They can stop current blood clots from growing and stop new clots from forming. They cannot dissolve existing clots. Your body dissolves clots by itself over time. Anticoagulants are given by mouth, by injection, or through an IV tube. What are thrombolytics? Thrombolytics are clot-dissolving medicines that are used to dissolve a PE. They carry a high risk of bleeding, so they tend to be used only in severe cases or if you have very low blood pressure. Follow these instructions at home: If you are taking a newer oral anticoagulant:  Take the medicine every single day at the same time each day.  Understand what foods and drugs interact with this medicine.  Understand that there are no regular blood tests required when using this medicine.  Understandthe side effects of this medicine, including excessive bruising or bleeding. Ask your health care provider or pharmacist about other possible side effects. If you are taking warfarin:  Understand how to take warfarin and know which foods can affect how warfarin works in Veterinary surgeon.  Understand that it is dangerous to taketoo much or too little warfarin. Too much warfarin increases the risk of bleeding. Too little warfarin continues to allow the risk for blood clots.  Follow your PT and INR blood testing schedule. The PT and INR results allow your health care provider to adjust your dose of warfarin. It is very important that you have your PT and INR tested as often as told by your health care provider.  Avoid major changes in your diet, or tell your health care provider  before you change your diet. Arrange a visit with a registered dietitian to answer your questions. Many foods, especially foods that are high in vitamin K, can interfere with warfarin and affect the PT and INR results. Eat a consistent amount of foods that are high in vitamin K, such as: ? Spinach, kale, broccoli, cabbage, collard greens, turnip greens, Brussels sprouts, peas, cauliflower, seaweed, and parsley. ? Beef liver and pork liver. ? Green tea. ? Soybean oil.  Tell your health care provider about any and all medicines, vitamins, and supplements that you take, including aspirin and other over-the-counter anti-inflammatory medicines. Be especially cautious with aspirin and anti-inflammatory medicines. Do not take those before you ask your health care provider if it is safe to do so. This is important because many medicines can interfere with warfarin and affect the PT and INR results.  Do not start or stop taking any over-the-counter or prescription medicine unless your health care provider or pharmacist tells you to do so. If you take warfarin, you will also need to do these things:  Hold pressure over cuts for longer than usual.  Tell your dentist and other health care providers that you are taking warfarin before you have  any procedures in which bleeding may occur.  Avoid alcohol or drink very small amounts. Tell your health care provider if you change your alcohol intake.  Do not use tobacco products, including cigarettes, chewing tobacco, and e-cigarettes. If you need help quitting, ask your health care provider.  Avoid contact sports.  General instructions  Take over-the-counter and prescription medicines only as told by your health care provider. Anticoagulant medicines can have side effects, including easy bruising and difficulty stopping bleeding. If you are prescribed an anticoagulant, you will also need to do these things: ? Hold pressure over cuts for longer than  usual. ? Tell your dentist and other health care providers that you are taking anticoagulants before you have any procedures in which bleeding may occur. ? Avoid contact sports.  Wear a medical alert bracelet or carry a medical alert card that says you have had a PE.  Ask your health care provider how soon you can go back to your normal activities. Stay active to prevent new blood clots from forming.  Make sure to exercise while traveling or when you have been sitting or standing for a long period of time. It is very important to exercise. Exercise your legs by walking or by tightening and relaxing your leg muscles often. Take frequent walks.  Wear compression stockings as told by your health care provider to help prevent more blood clots from forming.  Do not use tobacco products, including cigarettes, chewing tobacco, and e-cigarettes. If you need help quitting, ask your health care provider.  Keep all follow-up appointments with your health care provider. This is important. How is this prevented? Take these actions to decrease your risk of developing another PE:  Exercise regularly. For at least 30 minutes every day, engage in: ? Activity that involves moving your arms and legs. ? Activity that encourages good blood flow through your body by increasing your heart rate.  Exercise your arms and legs every hour during long-distance travel (over 4 hours). Drink plenty of water and avoid drinking alcohol while traveling.  Avoid sitting or lying in bed for long periods of time without moving your legs.  Maintain a weight that is appropriate for your height. Ask your health care provider what weight is healthy for you.  If you are a woman who is over 21 years of age, avoid unnecessary use of medicines that contain estrogen. These include birth control pills.  Do not smoke, especially if you take estrogen medicines. If you need help quitting, ask your health care provider.  If you are at  very high risk for PE, wear compression stockings.  If you recently had a PE, have regularly scheduled ultrasound testing on your legs to check for new blood clots.  If you are hospitalized, prevention measures may include:  Early walking after surgery, as soon as your health care provider says that it is safe.  Receiving anticoagulants to prevent blood clots. If you cannot take anticoagulants, other options may be available, such as wearing compression stockings or using different types of devices.  Get help right away if:  You have new or increased pain, swelling, or redness in an arm or leg.  You have numbness or tingling in an arm or leg.  You have shortness of breath while active or at rest.  You have chest pain.  You have a rapid or irregular heartbeat.  You feel light-headed or dizzy.  You cough up blood.  You notice blood in your vomit, bowel movement, or  urine.  You have a fever. These symptoms may represent a serious problem that is an emergency. Do not wait to see if the symptoms will go away. Get medical help right away. Call your local emergency services (911 in the [place] Do not drive yourself to the hospital. This information is not intended to replace advice given to you by your health care provider. Make sure you discuss any questions you have with your health care provider. Document Released: 10/05/2000 Document Revised: 03/15/2016 Document Reviewed: 02/02/2015 Elsevier Interactive Patient Education  2017 [employer] Chronic Pain, Adult Chronic pain is a type of pain that lasts or keeps coming back (recurs) for at least six months. You may have chronic headaches, abdominal pain, or body pain. Chronic pain may be related to an illness, such as fibromyalgia or complex regional pain syndrome. Sometimes the cause of chronic pain is not known. Chronic pain can make it hard for you to do daily activities. If not treated, chronic pain can lead to other health  problems, including anxiety and depression. Treatment depends on the cause and severity of your pain. You may need to work with a pain specialist to come up with a treatment plan. The plan may include medicine, counseling, and physical therapy. Many people benefit from a combination of two or more types of treatment to control their pain. Follow these instructions at home: Lifestyle  Consider keeping a pain diary to share with your health care providers.  Consider talking with a mental health care provider (psychologist) about how to cope with chronic pain.  Consider joining a chronic pain support group.  Try to control or lower your stress levels. Talk to your health care provider about strategies to do this. General instructions   Take over-the-counter and prescription medicines only as told by your health care provider.  Follow your treatment plan as told by your health care provider. This may include: ? Gentle, regular exercise. ? Eating a healthy diet that includes foods such as vegetables, fruits, fish, and lean meats. ? Cognitive or behavioral therapy. ? Working with a Adult nurse. ? Meditation or yoga. ? Acupuncture or massage therapy. ? Aroma, color, light, or sound therapy. ? Local electrical stimulation. ? Shots (injections) of numbing or pain-relieving medicines into the spine or the area of pain.  Check your pain level as told by your health care provider. Ask your health care provider if you should use a pain scale.  Learn as much as you can about how to manage your chronic pain. Ask your health care provider if an intensive pain rehabilitation program or a chronic pain specialist would be helpful.  Keep all follow-up visits as told by your health care provider. This is important. Contact a health care provider if:  Your pain gets worse.  You have new pain.  You have trouble sleeping.  You have trouble doing your normal activities.  Your pain is not  controlled with treatment.  Your have side effects from pain medicine.  You feel weak. Get help right away if:  You lose feeling or have numbness in your body.  You lose control of bowel or bladder function.  Your pain suddenly gets much worse.  You develop shaking or chills.  You develop confusion.  You develop chest pain.  You have trouble breathing or shortness of breath.  You pass out.  You have thoughts about hurting yourself or others. This information is not intended to replace advice given to you by your health care  provider. Make sure you discuss any questions you have with your health care provider. Document Released: 06/30/2002 Document Revised: 06/07/2016 Document Reviewed: 03/27/2016 Elsevier Interactive Patient Education  2017 [employer].

## 2017-06-13 ENCOUNTER — Emergency Department (HOSPITAL_COMMUNITY)
Admission: EM | Admit: 2017-06-13 | Discharge: 2017-06-14 | Disposition: A | Discharge status: Home / Self Care | Payer: Medicaid Other | Attending: Emergency Medicine | Admitting: Emergency Medicine

## 2017-06-13 ENCOUNTER — Encounter: Payer: Self-pay | Admitting: Registered Nurse

## 2017-06-13 ENCOUNTER — Encounter (HOSPITAL_COMMUNITY): Payer: Self-pay | Admitting: Nurse Practitioner

## 2017-06-13 ENCOUNTER — Encounter: Payer: Medicaid Other | Attending: Physical Medicine & Rehabilitation | Admitting: Registered Nurse

## 2017-06-13 VITALS — BP 153/97 | HR 90

## 2017-06-13 DIAGNOSIS — Z5181 Encounter for therapeutic drug level monitoring: Secondary | ICD-10-CM

## 2017-06-13 DIAGNOSIS — R45851 Suicidal ideations: Secondary | ICD-10-CM | POA: Diagnosis not present

## 2017-06-13 DIAGNOSIS — E1122 Type 2 diabetes mellitus with diabetic chronic kidney disease: Secondary | ICD-10-CM | POA: Insufficient documentation

## 2017-06-13 DIAGNOSIS — Z981 Arthrodesis status: Secondary | ICD-10-CM | POA: Diagnosis not present

## 2017-06-13 DIAGNOSIS — I442 Atrioventricular block, complete: Secondary | ICD-10-CM

## 2017-06-13 DIAGNOSIS — G894 Chronic pain syndrome: Secondary | ICD-10-CM

## 2017-06-13 DIAGNOSIS — N183 Chronic kidney disease, stage 3 (moderate): Secondary | ICD-10-CM | POA: Diagnosis not present

## 2017-06-13 DIAGNOSIS — Z955 Presence of coronary angioplasty implant and graft: Secondary | ICD-10-CM | POA: Diagnosis not present

## 2017-06-13 DIAGNOSIS — E1165 Type 2 diabetes mellitus with hyperglycemia: Secondary | ICD-10-CM | POA: Insufficient documentation

## 2017-06-13 DIAGNOSIS — M4802 Spinal stenosis, cervical region: Secondary | ICD-10-CM | POA: Diagnosis not present

## 2017-06-13 DIAGNOSIS — D696 Thrombocytopenia, unspecified: Secondary | ICD-10-CM

## 2017-06-13 DIAGNOSIS — M5416 Radiculopathy, lumbar region: Secondary | ICD-10-CM

## 2017-06-13 DIAGNOSIS — F329 Major depressive disorder, single episode, unspecified: Secondary | ICD-10-CM | POA: Insufficient documentation

## 2017-06-13 DIAGNOSIS — F1010 Alcohol abuse, uncomplicated: Secondary | ICD-10-CM | POA: Insufficient documentation

## 2017-06-13 DIAGNOSIS — I2510 Atherosclerotic heart disease of native coronary artery without angina pectoris: Secondary | ICD-10-CM | POA: Insufficient documentation

## 2017-06-13 DIAGNOSIS — F1429 Cocaine dependence with unspecified cocaine-induced disorder: Secondary | ICD-10-CM | POA: Diagnosis not present

## 2017-06-13 DIAGNOSIS — R040 Epistaxis: Secondary | ICD-10-CM | POA: Insufficient documentation

## 2017-06-13 DIAGNOSIS — I129 Hypertensive chronic kidney disease with stage 1 through stage 4 chronic kidney disease, or unspecified chronic kidney disease: Secondary | ICD-10-CM | POA: Insufficient documentation

## 2017-06-13 DIAGNOSIS — Z8673 Personal history of transient ischemic attack (TIA), and cerebral infarction without residual deficits: Secondary | ICD-10-CM | POA: Diagnosis not present

## 2017-06-13 DIAGNOSIS — Z79899 Other long term (current) drug therapy: Secondary | ICD-10-CM | POA: Insufficient documentation

## 2017-06-13 DIAGNOSIS — F330 Major depressive disorder, recurrent, mild: Secondary | ICD-10-CM | POA: Diagnosis not present

## 2017-06-13 DIAGNOSIS — Z7902 Long term (current) use of antithrombotics/antiplatelets: Secondary | ICD-10-CM | POA: Diagnosis not present

## 2017-06-13 DIAGNOSIS — E7800 Pure hypercholesterolemia, unspecified: Secondary | ICD-10-CM | POA: Insufficient documentation

## 2017-06-13 DIAGNOSIS — Z7901 Long term (current) use of anticoagulants: Secondary | ICD-10-CM | POA: Insufficient documentation

## 2017-06-13 DIAGNOSIS — M109 Gout, unspecified: Secondary | ICD-10-CM | POA: Insufficient documentation

## 2017-06-13 DIAGNOSIS — M4722 Other spondylosis with radiculopathy, cervical region: Secondary | ICD-10-CM | POA: Insufficient documentation

## 2017-06-13 DIAGNOSIS — R269 Unspecified abnormalities of gait and mobility: Secondary | ICD-10-CM | POA: Diagnosis not present

## 2017-06-13 DIAGNOSIS — I252 Old myocardial infarction: Secondary | ICD-10-CM | POA: Diagnosis present

## 2017-06-13 LAB — COMPREHENSIVE METABOLIC PANEL
ALBUMIN: 4 g/dL (ref 3.5–5.0)
ALK PHOS: 48 U/L (ref 38–126)
ALT: 15 U/L — AB (ref 17–63)
AST: 17 U/L (ref 15–41)
Anion gap: 6 (ref 5–15)
BILIRUBIN TOTAL: 0.4 mg/dL (ref 0.3–1.2)
BUN: 11 mg/dL (ref 6–20)
CALCIUM: 9 mg/dL (ref 8.9–10.3)
CO2: 25 mmol/L (ref 22–32)
CREATININE: 1.25 mg/dL — AB (ref 0.61–1.24)
Chloride: 112 mmol/L — ABNORMAL HIGH (ref 101–111)
GFR calc Af Amer: >60 mL/min (ref >60)
GFR calc non Af Amer: >60 mL/min (ref >60)
GLUCOSE: 111 mg/dL — AB (ref 65–99)
Potassium: 3.7 mmol/L (ref 3.5–5.1)
SODIUM: 143 mmol/L (ref 135–145)
TOTAL PROTEIN: 7 g/dL (ref 6.5–8.1)

## 2017-06-13 LAB — CBC WITH DIFFERENTIAL/PLATELET
BASOS ABS: 0 10*3/uL (ref 0.0–0.1)
BASOS PCT: 0 %
Eosinophils Absolute: 0 10*3/uL (ref 0.0–0.7)
Eosinophils Relative: 1 %
HEMATOCRIT: 40.4 % (ref 39.0–52.0)
Hemoglobin: 12.9 g/dL — ABNORMAL LOW (ref 13.0–17.0)
LYMPHS PCT: 29 %
Lymphs Abs: 1.4 10*3/uL (ref 0.7–4.0)
MCH: 27.2 pg (ref 26.0–34.0)
MCHC: 31.9 g/dL (ref 30.0–36.0)
MCV: 85.2 fL (ref 78.0–100.0)
MONO ABS: 0.4 10*3/uL (ref 0.1–1.0)
Monocytes Relative: 8 %
NEUTROS ABS: 3 10*3/uL (ref 1.7–7.7)
NEUTROS PCT: 62 %
Platelets: 241 10*3/uL (ref 150–400)
RBC: 4.74 MIL/uL (ref 4.22–5.81)
RDW: 17.8 % — AB (ref 11.5–15.5)
WBC: 4.9 10*3/uL (ref 4.0–10.5)

## 2017-06-13 LAB — ETHANOL: Alcohol, Ethyl (B): <5 mg/dL (ref <5)

## 2017-06-13 LAB — RAPID URINE DRUG SCREEN, HOSP PERFORMED
Amphetamines: NOT DETECTED
BARBITURATES: NOT DETECTED
Benzodiazepines: NOT DETECTED
Cocaine: NOT DETECTED
Opiates: NOT DETECTED
TETRAHYDROCANNABINOL: NOT DETECTED

## 2017-06-13 LAB — PROTIME-INR
INR: 1.85
PROTHROMBIN TIME: 21.6 s — AB (ref 11.4–15.2)

## 2017-06-13 LAB — CBG MONITORING, ED: GLUCOSE-CAPILLARY: 118 mg/dL — AB (ref 65–99)

## 2017-06-13 LAB — SALICYLATE LEVEL: Salicylate Lvl: <7 mg/dL (ref 2.8–30.0)

## 2017-06-13 LAB — ACETAMINOPHEN LEVEL

## 2017-06-13 MED ORDER — ONDANSETRON HCL 4 MG PO TABS: 4.0000 mg | ORAL_TABLET | Freq: Three times a day (TID) | ORAL | Status: DC | PRN

## 2017-06-13 MED ORDER — CARISOPRODOL 350 MG PO TABS: 350.0000 mg | ORAL_TABLET | Freq: Four times a day (QID) | ORAL | Status: DC | PRN

## 2017-06-13 MED ORDER — GABAPENTIN 400 MG PO CAPS
800.0000 mg | ORAL_CAPSULE | Freq: Four times a day (QID) | ORAL | Status: DC
  Administered 2017-06-13 – 2017-06-14 (×3): 800 mg via ORAL
  Filled 2017-06-13 (×3): qty 2

## 2017-06-13 MED ORDER — DEXAMETHASONE 4 MG PO TABS
10.0000 mg | ORAL_TABLET | Freq: Once | ORAL | Status: AC
  Administered 2017-06-13: 10 mg via ORAL
  Filled 2017-06-13: qty 1

## 2017-06-13 MED ORDER — DOCUSATE SODIUM 100 MG PO CAPS
100.0000 mg | ORAL_CAPSULE | Freq: Two times a day (BID) | ORAL | Status: DC
  Administered 2017-06-14: 100 mg via ORAL
  Filled 2017-06-13: qty 1

## 2017-06-13 MED ORDER — DIAZEPAM 5 MG PO TABS
5.0000 mg | ORAL_TABLET | Freq: Once | ORAL | Status: AC
  Administered 2017-06-13: 5 mg via ORAL
  Filled 2017-06-13: qty 1

## 2017-06-13 MED ORDER — WARFARIN SODIUM 5 MG PO TABS
5.0000 mg | ORAL_TABLET | Freq: Every day | ORAL | Status: DC
  Administered 2017-06-13 – 2017-06-14 (×2): 5 mg via ORAL
  Filled 2017-06-13 (×2): qty 1

## 2017-06-13 MED ORDER — ALLOPURINOL 300 MG PO TABS
300.0000 mg | ORAL_TABLET | Freq: Every day | ORAL | Status: DC
  Administered 2017-06-14: 300 mg via ORAL
  Filled 2017-06-13: qty 1

## 2017-06-13 MED ORDER — OXYCODONE-ACETAMINOPHEN 5-325 MG PO TABS
2.0000 | ORAL_TABLET | Freq: Once | ORAL | Status: AC
  Administered 2017-06-13: 2 via ORAL
  Filled 2017-06-13: qty 2

## 2017-06-13 MED ORDER — PANTOPRAZOLE SODIUM 40 MG PO TBEC
40.0000 mg | DELAYED_RELEASE_TABLET | Freq: Every day | ORAL | Status: DC
  Administered 2017-06-13: 40 mg via ORAL
  Filled 2017-06-13: qty 1

## 2017-06-13 MED ORDER — CLOPIDOGREL BISULFATE 75 MG PO TABS
75.0000 mg | ORAL_TABLET | Freq: Every day | ORAL | Status: DC
  Administered 2017-06-13 – 2017-06-14 (×2): 75 mg via ORAL
  Filled 2017-06-13 (×2): qty 1

## 2017-06-13 MED ORDER — WARFARIN - PHYSICIAN DOSING INPATIENT
Freq: Every day | Status: DC
Start: 2017-06-13 — End: 2017-06-14

## 2017-06-13 MED ORDER — ROSUVASTATIN CALCIUM 20 MG PO TABS
40.0000 mg | ORAL_TABLET | Freq: Every day | ORAL | Status: DC
  Administered 2017-06-13: 40 mg via ORAL
  Filled 2017-06-13: qty 2
  Filled 2017-06-13 (×2): qty 1

## 2017-06-13 MED ORDER — CITALOPRAM HYDROBROMIDE 10 MG PO TABS: 10.0000 mg | ORAL_TABLET | Freq: Every day | ORAL | Status: DC

## 2017-06-13 MED ORDER — HYDROXYZINE HCL 25 MG PO TABS
25.0000 mg | ORAL_TABLET | Freq: Four times a day (QID) | ORAL | Status: DC | PRN
  Administered 2017-06-13: 25 mg via ORAL
  Filled 2017-06-13: qty 1

## 2017-06-13 MED ORDER — CARISOPRODOL 350 MG PO TABS
350.0000 mg | ORAL_TABLET | Freq: Four times a day (QID) | ORAL | Status: DC | PRN
  Administered 2017-06-14: 350 mg via ORAL
  Filled 2017-06-13: qty 1

## 2017-06-13 MED ORDER — DULOXETINE HCL 20 MG PO CPEP
20.0000 mg | ORAL_CAPSULE | Freq: Every day | ORAL | Status: DC
  Administered 2017-06-13 – 2017-06-14 (×2): 20 mg via ORAL
  Filled 2017-06-13 (×2): qty 1

## 2017-06-13 MED ORDER — NICOTINE 21 MG/24HR TD PT24
21.0000 mg | MEDICATED_PATCH | Freq: Every day | TRANSDERMAL | Status: DC
  Filled 2017-06-13: qty 1

## 2017-06-13 MED ORDER — ACETAMINOPHEN 325 MG PO TABS
650.0000 mg | ORAL_TABLET | ORAL | Status: DC | PRN
  Administered 2017-06-14 (×2): 650 mg via ORAL
  Filled 2017-06-13 (×2): qty 2

## 2017-06-13 MED ORDER — ZOLPIDEM TARTRATE 5 MG PO TABS: 5.0000 mg | ORAL_TABLET | Freq: Every evening | ORAL | Status: DC | PRN

## 2017-06-13 MED ORDER — ALUM & MAG HYDROXIDE-SIMETH 200-200-20 MG/5ML PO SUSP
30.0000 mL | Freq: Four times a day (QID) | ORAL | Status: DC | PRN
Start: 2017-06-13 — End: 2017-06-14

## 2017-06-13 NOTE — Progress Notes (Signed)
Subjective:    Patient ID: Russell Carter, male    DOB: 09-05-1956, 61 y.o.   MRN: [id number]  HPI: [name] is a 61 year old male who returns for follow up appointment regarding his cervical stenosis and myelopathy. He states his pain is located in his  neck radiating into his bilateral shoulders, left arm with tingling and numbness, also mid-lower back pain radiating into his bilateral lower extremities posteriorly. He rates his pain 10. His current exercise regime is walking.   [name] told Russell Carter his pain was so bad he doesn't want to live anymore, I spoke with [name] he admits to being depressed with suicidal ideation and plan. He wouldn't discuss or elaborate on his suicidal plan with this provider. [name] refused to go to ED, Russell Carter the manager was called, Dr. Riley Carter was notified of the above and the police was called. [name] his pocket knife to Nurse, adult.  [name] eventually decided to go with the officers.   The NCCSR was reviewed and the narcotic policy was reviewed with [name] he verbalizes understanding.    Pain Inventory Average Pain 8 Pain Right Now 10 My pain is sharp, stabbing and tingling  In the last 24 hours, has pain interfered with the following? General activity 10 Relation with others 10 Enjoyment of life 10 What TIME of day is your pain at its worst? all Sleep (in general) Poor  Pain is worse with: walking, bending, inactivity and standing Pain improves with: medication Relief from Meds: 8  Mobility walk without assistance use a cane ability to climb steps?  yes do you drive?  no  Function I need assistance with the following:  feeding, dressing, bathing, toileting, meal prep, household duties and shopping  Neuro/Psych weakness numbness tremor tingling trouble walking spasms dizziness depression suicidal thoughts  Prior Studies Any changes since last visit?  no  Physicians involved in your care Any  changes since last visit?  no   Family History  Problem Relation Age of Onset  . Heart attack Mother        x7. Still alive  . Heart attack Father        deceased b/c of MI  . Heart attack Brother        older, deceased b/c of MI   . Cancer Brother        bone cancer in 1 brother, ? brain cancer in other brother   Social History   Social History  . Marital status: Widowed    Spouse name: N/A  . Number of children: N/A  . Years of education: N/A   Social History Main Topics  . Smoking status: Never Smoker  . Smokeless tobacco: Never Used  . Alcohol use Yes     Comment: 16 oz beer every 2-3 days  . Drug use: Yes    Types: "Crack" cocaine, Marijuana, Cocaine     Comment: 03/14/2016 "quit 2008"  . Sexual activity: Not Currently   Other Topics Concern  . Not on file   Social History Narrative   Lives in [place] with his wife and son.    Past Surgical History:  Procedure Laterality Date  . ANTERIOR CERVICAL DECOMP/DISCECTOMY FUSION N/A 01/24/2017   Procedure: Cervical Four-Five,Cervical Five Six,Cervical Six-Seven Anterior cervical discectomy with fusion and plate with Cervical Three-Thoracic- One Laminectomy/Foraminotomy with Cervical Three-Thoracic- One Fixation and fusion;  Surgeon: Russell Halt Ditty, MD;  Location: [facility] OR;  Service: Neurosurgery;  Laterality: N/A;  Part-1 anterior approach  . CARDIAC CATHETERIZATION  2008; ~ 2011   [place]-40%lad  . CARDIAC CATHETERIZATION N/A 03/16/2016   Procedure: Left Heart Cath and Coronary Angiography;  Surgeon: Russell Lex, MD;  Location: [facility] INVASIVE CV LAB;  Service: Cardiovascular;  Laterality: N/A;  . CORONARY ANGIOPLASTY     STENTS  . EYE SURGERY Bilateral    "for double vision"  . INGUINAL HERNIA REPAIR  05/12/2012   Procedure: HERNIA REPAIR INGUINAL ADULT;  Surgeon: Russell Malady, MD;  Location: [facility];  Service: General;  Laterality: Right;  . INGUINAL HERNIA REPAIR Left 02/02/2003   Dr. Violeta Carter. repair with mesh. Same procedure done again in  05/22/2004   . INGUINAL HERNIA REPAIR Left ?date 2nd OR  . POSTERIOR CERVICAL FUSION/FORAMINOTOMY N/A 01/24/2017   Procedure: Cervical Three-Thoracic One-Laminectomy/Foraminotomy with Cervical Three-Thoracic One Fixation and fusion;  Surgeon: Russell Halt Ditty, MD;  Location: [facility] OR;  Service: Neurosurgery;  Laterality: N/A;  Part-2 Posterior approach  . RECONSTRUCT / STABILIZE DISTAL ULNA    . SHOULDER OPEN ROTATOR CUFF REPAIR Left 2010  . SHOULDER SURGERY Left    "injured on the job; had to cut small bone out"  . THUMB FUSION Right    Past Medical History:  Diagnosis Date  . Anginal pain (HCC)   . Arthritis   . Blood in stool   . CAD (coronary artery disease)    a. 2011: cath showing mod non-obstructive disease b. 02/2016: cath with 95% stenosis in the Mid RCA. DES placed.  . Cervical stenosis of spine   . Cocaine abuse    history  . Depression   . Diabetes mellitus without complication (HCC)   . Gout, unspecified   . Headache   . HTN (hypertension)   . Hypercholesterolemia   . Hyperglycemia   . Migraines   . Myocardial infarction [facility]) 2008   drug cocaine and marijuana at time of mi none  since    . Pneumonia   . Renal insufficiency   . Stroke [facility])    There were no vitals taken for this visit.  Opioid Risk Score:   Fall Risk Score:  `1  Depression screen PHQ 2/9  Depression screen [facility] 2/9 06/05/2017 05/01/2017 02/21/2017  Decreased Interest 1 3 0  Down, Depressed, Hopeless 3 3 1   PHQ - 2 Score 4 6 1   Altered sleeping - 3 0  Tired, decreased energy - 3 0  Change in appetite - 3 0  Feeling bad or failure about yourself  - 0 0  Trouble concentrating - 0 0  Moving slowly or fidgety/restless - 0 0  Suicidal thoughts - 3 0  PHQ-9 Score - 18 1  Difficult doing work/chores - - Not difficult at all     Review of Systems  Constitutional: Positive for appetite change and diaphoresis.  HENT: Negative.   Eyes:  Negative.   Respiratory: Positive for cough and shortness of breath.   Cardiovascular: Negative.   Gastrointestinal: Positive for abdominal pain.  Endocrine: Negative.   Genitourinary: Negative.   Musculoskeletal: Positive for joint swelling.  Skin: Negative.   Allergic/Immunologic: Negative.   Neurological: Negative.   Hematological: Bruises/bleeds easily.  Psychiatric/Behavioral: Negative.   All other systems reviewed and are negative.      Objective:   Physical Exam  Constitutional: He is oriented to person, place, and time. He appears well-developed and well-nourished.  HENT:  Head: Normocephalic and atraumatic.  Neck: Normal range of motion. Neck supple.  Cervical Paraspinal Tenderness: C-4- C-6  Cardiovascular: Normal rate and regular rhythm.   Pulmonary/Chest: Effort normal and breath sounds normal.  Musculoskeletal:  Normal Muscle Bulk and Muscle Testing Reveals: Upper Extremities:Right  Decreased ROM 45 Degrees and Muscle Strength 4/5 Left: Decreased ROM 20 Degrees and Muscle Strength 3/5 Bilateral AC Joint Tenderness Thoracic and Lumbar Hypersensitivity Lower Extremities: Full ROM and Muscle Strength 5/5 Arises from chair Slowly Narrow Based gait   Neurological: He is alert and oriented to person, place, and time.  Skin: Skin is warm and dry.  Psychiatric: He has a normal mood and affect.  Nursing note and vitals reviewed.         Assessment & Plan:  1. Cervical spondylosis with radiculopathy s/p C4-7 anterior discectomy and fusion, posterior cervical fusion C3-C6. Dr. Bevely Palmer Following. 2. Lumbar Radiculopathy: Continue Gabapentin 3. Pain Management: Continue Gabapentin. Tramadol and Tylenol #3 being prescribed by PCP. NCCSRS was reviewed today.  4.Depression/ Suicidal ideation/ Suicidal Plan: Police Called/ Escorted To [employer].   60 minutes of face to face patient care time was spent during this visit. All questions were encouraged and  answered.

## 2017-06-13 NOTE — ED Notes (Signed)
Attempted to move pt to Room 37.  Pt walks with a limp.  Pt stated "the pain in my shoulder makes me walk this way.  I use a cane @ home."

## 2017-06-13 NOTE — ED Notes (Signed)
Pt reporting central CP without radiation.  MD Isaacs aware.  EKG in process.

## 2017-06-13 NOTE — ED Notes (Signed)
Yellow armband applied 

## 2017-06-13 NOTE — ED Notes (Signed)
Bed: WA29 Expected date:  Expected time:  Means of arrival:  Comments: 

## 2017-06-13 NOTE — ED Notes (Signed)
Bed: RF75 Expected date:  Expected time:  Means of arrival:  Comments: [employer] patient

## 2017-06-13 NOTE — ED Notes (Addendum)
Pt stated "I was feeling suicidal until I was able to talk to people today that would listen to me.  I've been trying to get people to listen since April 2.  I've talked to my primary care doctor, ER doctors and no one would listen.  How do you think it feels for no one to listen?  I live with my mother and my brother."

## 2017-06-13 NOTE — ED Triage Notes (Signed)
Patient was at [facility] doctor appointment for his neck and back pain. MD would not prescribe pain medication for the patient so the patient stated he wanted to kill himself and had a plan.

## 2017-06-13 NOTE — ED Provider Notes (Signed)
[facility]-EMERGENCY DEPT Provider Note   CSN: [id number] Arrival date & time: 06/13/17  1022     History   Chief Complaint No chief complaint on file.   HPI   Terique Gao is a 61 y.o. male sent from primary care with suicidal ideation. Patient states that he has chronic neck pain, he normally takes oxycodone 7.5 mg. He states that the pain is typical in location and character but more severe than normal. He denies any numbness or weakness. He states that the pain has been so severe that he hasn't been able to sleep for the last 4 days and he's been feeling suicidal. He did not threaten the doctor that saw him before he came to the ED with suicidality after they refused narcotic prescription as per nursing note. He states he's been feeling like this recently. He states he has a prior suicide attempt in which she cut his wrists. He denies any alcohol or drug abuse, auditory or visual hallucinations, chest pain, abdominal pain, weakness.   Past Medical History:  Diagnosis Date  . Anginal pain (HCC)   . Arthritis   . Blood in stool   . CAD (coronary artery disease)    a. 2011: cath showing mod non-obstructive disease b. 02/2016: cath with 95% stenosis in the Mid RCA. DES placed.  . Cervical stenosis of spine   . Cocaine abuse    history  . Depression   . Diabetes mellitus without complication (HCC)   . Gout, unspecified   . Headache   . HTN (hypertension)   . Hypercholesterolemia   . Hyperglycemia   . Migraines   . Myocardial infarction [facility]) 2008   drug cocaine and marijuana at time of mi none  since    . Pneumonia   . Renal insufficiency   . Stroke [facility])     Patient Active Problem List   Diagnosis Date Noted  . Chronic pain disorder 06/05/2017  . Elevated INR 04/27/2017  . Gastrointestinal hemorrhage with melena   . Blood loss anemia 04/25/2017  . Pulmonary embolism (HCC) 02/14/2017  . Fatigue associated with anemia   . Post-operative pain   . Radicular pain   .  Diabetes mellitus type 2 in nonobese (HCC)   . Surgery, elective   . Postoperative pain   . Leukocytosis   . Thrombocytopenia (HCC)   . History of myocardial infarction   . Cocaine abuse   . Depression   . Type 2 diabetes mellitus with hyperglycemia, without long-term current use of insulin (HCC)   . Stage 3 chronic kidney disease   . Benign essential HTN   . History of CVA (cerebrovascular accident)   . Prediabetes   . Cervical spondylosis with radiculopathy 01/24/2017  . Dyslipidemia 03/15/2016  . Unstable angina (HCC) - chest pain with complete heart block & chest pain 03/15/2016  . Complete heart block (HCC) 03/14/2016  . Cervical spinal stenosis 03/14/2016  . Chest pain with moderate risk of acute coronary syndrome 03/13/2016  . CAD-50% LAD 2011 12/14/2014  . Gout 11/16/2014  . Headache 07/15/2013  . Polysubstance abuse 01/15/2013  . DIZZINESS 06/05/2010  . Essential hypertension 05/18/2009  . Acute myocardial infarction (HCC) 05/18/2009  . MUSCLE SPASM 05/18/2009    Past Surgical History:  Procedure Laterality Date  . ANTERIOR CERVICAL DECOMP/DISCECTOMY FUSION N/A 01/24/2017   Procedure: Cervical Four-Five,Cervical Five Six,Cervical Six-Seven Anterior cervical discectomy with fusion and plate with Cervical Three-Thoracic- One Laminectomy/Foraminotomy with Cervical Three-Thoracic- One Fixation and fusion;  Surgeon:  Loura Halt Ditty, MD;  Location: [facility] OR;  Service: Neurosurgery;  Laterality: N/A;  Part-1 anterior approach  . CARDIAC CATHETERIZATION  2008; ~ 2011   [place]-40%lad  . CARDIAC CATHETERIZATION N/A 03/16/2016   Procedure: Left Heart Cath and Coronary Angiography;  Surgeon: Marykay Lex, MD;  Location: [facility] INVASIVE CV LAB;  Service: Cardiovascular;  Laterality: N/A;  . CORONARY ANGIOPLASTY     STENTS  . EYE SURGERY Bilateral    "for double vision"  . INGUINAL HERNIA REPAIR  05/12/2012   Procedure: HERNIA REPAIR INGUINAL ADULT;  Surgeon: Liz Malady, MD;   Location: [facility];  Service: General;  Laterality: Right;  . INGUINAL HERNIA REPAIR Left 02/02/2003   Dr. Violeta Gelinas. repair with mesh. Same procedure done again in  05/22/2004   . INGUINAL HERNIA REPAIR Left ?date 2nd OR  . POSTERIOR CERVICAL FUSION/FORAMINOTOMY N/A 01/24/2017   Procedure: Cervical Three-Thoracic One-Laminectomy/Foraminotomy with Cervical Three-Thoracic One Fixation and fusion;  Surgeon: Loura Halt Ditty, MD;  Location: [facility] OR;  Service: Neurosurgery;  Laterality: N/A;  Part-2 Posterior approach  . RECONSTRUCT / STABILIZE DISTAL ULNA    . SHOULDER OPEN ROTATOR CUFF REPAIR Left 2010  . SHOULDER SURGERY Left    "injured on the job; had to cut small bone out"  . THUMB FUSION Right        Home Medications    Prior to Admission medications   Medication Sig Start Date End Date Taking? Authorizing Provider  allopurinol (ZYLOPRIM) 300 MG tablet Take 1 tablet (300 mg total) by mouth daily. 05/02/17   Quentin Angst, MD  CALCIUM-VITAMIN D PO Take 1 tablet by mouth daily.    [provider]  carisoprodol (SOMA) 350 MG tablet Take 1 tablet (350 mg total) by mouth 4 (four) times daily as needed for muscle spasms. 06/05/17   Quentin Angst, MD  clopidogrel (PLAVIX) 75 MG tablet Take 1 tablet (75 mg total) by mouth daily. 05/02/17   Quentin Angst, MD  docusate sodium (COLACE) 100 MG capsule Take 1 capsule (100 mg total) by mouth 2 (two) times daily. 02/12/17   Angiulli, Mcarthur Rossetti, PA-C  gabapentin (NEURONTIN) 400 MG capsule Take 2 capsules (800 mg total) by mouth 4 (four) times daily. 05/02/17   Quentin Angst, MD  methocarbamol (ROBAXIN) 750 MG tablet Take 1 tablet (750 mg total) by mouth every 8 (eight) hours as needed for muscle spasms. 05/02/17   Quentin Angst, MD  nitroGLYCERIN (NITROSTAT) 0.4 MG SL tablet Place 1 tablet (0.4 mg total) under the tongue every 5 (five) minutes as needed for chest pain. 05/02/17   Quentin Angst, MD  pantoprazole (PROTONIX) 40 MG tablet Take 1 tablet (40 mg total) by mouth at bedtime. 05/02/17   Quentin Angst, MD  rosuvastatin (CRESTOR) 40 MG tablet Take 1 tablet (40 mg total) by mouth at bedtime. 05/02/17 07/31/17  Quentin Angst, MD  warfarin (COUMADIN) 5 MG tablet Take 1 tablet (5 mg total) by mouth daily. 05/02/17   Quentin Angst, MD    Family History Family History  Problem Relation Age of Onset  . Heart attack Mother        x7. Still alive  . Heart attack Father        deceased b/c of MI  . Heart attack Brother        older, deceased b/c of MI   . Cancer Brother  bone cancer in 1 brother, ? brain cancer in other brother    Social History Social History  Substance Use Topics  . Smoking status: Never Smoker  . Smokeless tobacco: Never Used  . Alcohol use Yes     Comment: 16 oz beer every 2-3 days     Allergies   Shellfish allergy   Review of Systems Review of Systems  A complete review of systems was obtained and all systems are negative except as noted in the HPI and PMH.   Physical Exam Updated Vital Signs BP (!) 153/92 (BP Location: Right Arm)   Pulse 74   Temp 98.4 F (36.9 C) (Oral)   Resp 16   Ht 5\' 11"  (1.803 m)   Wt 83.9 kg (185 lb)   SpO2 98%   BMI 25.80 kg/m   Physical Exam  Constitutional: He is oriented to person, place, and time. He appears well-developed and well-nourished. No distress.  HENT:  Head: Normocephalic and atraumatic.  Mouth/Throat: Oropharynx is clear and moist.  Eyes: Pupils are equal, round, and reactive to light. Conjunctivae and EOM are normal.  Neck: Normal range of motion.  Cardiovascular: Normal rate, regular rhythm and intact distal pulses.   Pulmonary/Chest: Effort normal and breath sounds normal.  Abdominal: Soft. There is no tenderness.  Musculoskeletal: Normal range of motion.  Neurological: He is alert and oriented to person, place, and time.  Skin: He is not diaphoretic.    Psychiatric: His affect is blunt and labile. He is agitated. He is not actively hallucinating. He expresses suicidal ideation. He expresses suicidal plans. He expresses no homicidal plans. He is attentive.  Nursing note and vitals reviewed.    ED Treatments / Results  Labs (all labs ordered are listed, but only abnormal results are displayed) Labs Reviewed  COMPREHENSIVE METABOLIC PANEL - Abnormal; Notable for the following:       Result Value   Chloride 112 (*)    Glucose, Bld 111 (*)    Creatinine, Ser 1.25 (*)    ALT 15 (*)    All other components within normal limits  CBC WITH DIFFERENTIAL/PLATELET - Abnormal; Notable for the following:    Hemoglobin 12.9 (*)    RDW 17.8 (*)    All other components within normal limits  ACETAMINOPHEN LEVEL - Abnormal; Notable for the following:    Acetaminophen (Tylenol), Serum <10 (*)    All other components within normal limits  CBG MONITORING, ED - Abnormal; Notable for the following:    Glucose-Capillary 118 (*)    All other components within normal limits  ETHANOL  RAPID URINE DRUG SCREEN, HOSP PERFORMED  SALICYLATE LEVEL    EKG  EKG Interpretation None       Radiology No results found.  Procedures Procedures (including critical care time)  Medications Ordered in ED Medications  acetaminophen (TYLENOL) tablet 650 mg (not administered)  ondansetron (ZOFRAN) tablet 4 mg (not administered)  zolpidem (AMBIEN) tablet 5 mg (not administered)  nicotine (NICODERM CQ - dosed in mg/24 hours) patch 21 mg (not administered)  alum & mag hydroxide-simeth (MAALOX/MYLANTA) 200-200-20 MG/5ML suspension 30 mL (not administered)  oxyCODONE-acetaminophen (PERCOCET/ROXICET) 5-325 MG per tablet 2 tablet (2 tablets Oral Given 06/13/17 1118)     Initial Impression / Assessment and Plan / ED Course  I have reviewed the triage vital signs and the nursing notes.  Pertinent labs & imaging results that were available during my care of the  patient were reviewed by me and considered in  my medical decision making (see chart for details).     Vitals:   06/13/17 1030 06/13/17 1131  BP:  (!) 153/92  Pulse:  74  Resp:  16  Temp:  98.4 F (36.9 C)  TempSrc:  Oral  SpO2:  98%  Weight: 83.9 kg (185 lb)   Height: 5\' 11"  (1.803 m)     Medications  acetaminophen (TYLENOL) tablet 650 mg (not administered)  ondansetron (ZOFRAN) tablet 4 mg (not administered)  zolpidem (AMBIEN) tablet 5 mg (not administered)  nicotine (NICODERM CQ - dosed in mg/24 hours) patch 21 mg (not administered)  alum & mag hydroxide-simeth (MAALOX/MYLANTA) 200-200-20 MG/5ML suspension 30 mL (not administered)  oxyCODONE-acetaminophen (PERCOCET/ROXICET) 5-325 MG per tablet 2 tablet (2 tablets Oral Given 06/13/17 1118)    Dayn Crosby is 61 y.o. male presenting with Exacerbation of his chronic neck radiculopathy. He has suicidal ideation with plan. He has a prior suicide attempt. Patient states he wants to go home he states he has things to do. Unfortunately, this patient will have to be involuntarily committed for his own safety. People were faxed to magistrate.   Patient is medically cleared for psychiatric evaluation will be transferred to the psych ED. TTS consulted, home meds and psych standard holding orders placed.    Final Clinical Impressions(s) / ED Diagnoses   Final diagnoses:  Suicidal ideation    New Prescriptions New Prescriptions   No medications on file     Kaylyn Lim 06/13/17 1235    Shaune Pollack, MD 06/14/17 1220

## 2017-06-13 NOTE — BH Assessment (Addendum)
Assessment Note  Russell Carter is an 62 y.o. male with history of depression and substance abuse. He presents to [facility] BIB by [employer]. Patient was at his doctors appointment today for a follow up visit. States that his doctor refused to give him pain medications. Patient explains that he recently had surgery his left arm April 2018. Following the surgery he loss all functionality of his left arm leaving him in need of assistance with most if not all of his ADL's. Sts, "That Dr. Bevely Palmer did this to me and he doesn't even want to give me pain medications". Says that Dr. Bevely Palmer is a neurosurgeon. Patient also says that he is embarrassed that he no longer has his independence. His mother assist him with a lot of his day to day functions.He needs assistance with ADL's including dressing, grooming, and bathing. He uses a cane to assist him with ambulation.    He is in turmoil due to constant, severe, and lingering pain resulting in suicidal thoughts. He has tried to commit suicide in the past by cutting his wrist. Previous suicide attempt was due to "my wife trying to leave me for another man". Patient further explains that his suicidal thoughts were earlier today and he no longer wants to harm himself. He admits that he wanted to kill Dr. Bevely Palmer by shooting himself and then killing himself. He doesn't have access to a gun but knows where to find a gun. He previously worked as a Electrical engineer and says he can go to his old job to get a gun. He has no history of violent or aggressive behaviors. No legal issues. He denies AVH's. He denies current alcohol and drug use. He ha used cocaine in the past. Last use of cocaine was in 2008. He does not have a current therapist or psychiatrist. He has received INPT treatment 1x at [facility] and says it was several years ago. He doesn't recall the exact date. Patient is dressed in scrubs. Eye contact is poor. He is oriented to time, person, place, and situation.   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 Anginal pain (HCC)   . Arthritis   . Blood in stool   . CAD (coronary artery disease)    a. 2011: cath showing mod non-obstructive disease b. 02/2016: cath with 95% stenosis in the Mid RCA. DES placed.  . Cervical stenosis of spine   . Cocaine abuse    history  . Depression   . Diabetes mellitus without complication (HCC)   . Gout, unspecified   . Headache   . HTN (hypertension)   . Hypercholesterolemia   . Hyperglycemia   . Migraines   . Myocardial infarction [facility]) 2008   drug cocaine and marijuana at time of mi none  since    . Pneumonia   . Renal insufficiency   . Stroke [facility])     Past Surgical History:  Procedure Laterality Date  . ANTERIOR CERVICAL DECOMP/DISCECTOMY FUSION N/A 01/24/2017   Procedure: Cervical Four-Five,Cervical Five Six,Cervical Six-Seven Anterior cervical discectomy with fusion and plate with Cervical Three-Thoracic- One Laminectomy/Foraminotomy with Cervical Three-Thoracic- One Fixation and fusion;  Surgeon: Loura Halt Ditty, MD;  Location: [facility] OR;  Service: Neurosurgery;  Laterality: N/A;  Part-1 anterior approach  . CARDIAC CATHETERIZATION  2008; ~ 2011   [place]-40%lad  . CARDIAC CATHETERIZATION N/A 03/16/2016   Procedure: Left Heart Cath and Coronary Angiography;  Surgeon: Marykay Lex, MD;  Location: [facility] INVASIVE CV LAB;  Service: Cardiovascular;  Laterality: N/A;  . CORONARY ANGIOPLASTY     STENTS  . EYE SURGERY Bilateral    "for double vision"  . INGUINAL HERNIA REPAIR  05/12/2012   Procedure: HERNIA REPAIR INGUINAL ADULT;  Surgeon: Liz Malady, MD;  Location: [facility];  Service: General;  Laterality: Right;  . INGUINAL HERNIA REPAIR Left 02/02/2003   Dr. Violeta Gelinas. repair with mesh. Same procedure done again in  05/22/2004   . INGUINAL HERNIA REPAIR Left ?date 2nd OR  .  POSTERIOR CERVICAL FUSION/FORAMINOTOMY N/A 01/24/2017   Procedure: Cervical Three-Thoracic One-Laminectomy/Foraminotomy with Cervical Three-Thoracic One Fixation and fusion;  Surgeon: Loura Halt Ditty, MD;  Location: [facility] OR;  Service: Neurosurgery;  Laterality: N/A;  Part-2 Posterior approach  . RECONSTRUCT / STABILIZE DISTAL ULNA    . SHOULDER OPEN ROTATOR CUFF REPAIR Left 2010  . SHOULDER SURGERY Left    "injured on the job; had to cut small bone out"  . THUMB FUSION Right     Family History:  Family History  Problem Relation Age of Onset  . Heart attack Mother        x7. Still alive  . Heart attack Father        deceased b/c of MI  . Heart attack Brother        older, deceased b/c of MI   . Cancer Brother        bone cancer in 1 brother, ? brain cancer in other brother    Social History:  reports that he has never smoked. He has never used smokeless tobacco. He reports that he drinks alcohol. He reports that he uses drugs, including "Crack" cocaine, Marijuana, and Cocaine.  Additional Social History:  Alcohol / Drug Use Pain Medications: SEE MAR Prescriptions: SEE MAR Over the Counter: SEE MAR History of alcohol / drug use?: Yes Substance #1 Name of Substance 1: crack cocaine  1 - Age of First Use: 61 yrs old  1 - Amount (size/oz): alot  1 - Frequency: daily 1 - Duration: on-going 1 - Last Use / Amount: "Summer of 2009.Marland KitchenMarland KitchenMarland KitchenI stopped after I had a heart attack"  CIWA: CIWA-Ar BP: (!) 153/92 Pulse Rate: 74 COWS:    Allergies:  Allergies  Allergen Reactions  . Shellfish Allergy Anaphylaxis    Home Medications:  (Not in a hospital admission)  OB/GYN Status:  No LMP for male patient.  General Assessment Data Location of Assessment: [facility] TTS Assessment: In system Is this a Tele or Face-to-Face Assessment?: Face-to-Face Is this an Initial Assessment or a Re-assessment for this encounter?: Initial Assessment Marital status: Widowed [place] name:  (n/a) Is  patient pregnant?: No Pregnancy Status: No Living Arrangements: Other (Comment), Parent (lives with mother ) Can pt return to current living arrangement?: Yes Is patient capable of signing voluntary admission?: Yes Referral Source: Self/Family/Friend Insurance type:  (Self Pay )     Crisis Care Plan Living Arrangements: Other (Comment), Parent (lives with mother ) Legal Guardian: Other: (no legal guardian/ POA is Casimiro Needle Kersten Jr. (Son)) Name of Psychiatrist:  (no psychiatrist ) Name of Therapist:  (no therapist )  Education Status Is patient currently in school?: No Current Grade:  (n/a) Highest grade of school patient has completed:  (1 year of college) Name of school:  (n/a) Contact person:  (n/a)  Risk to self with the past 6 months Suicidal Ideation: No-Not Currently/Within Last 6  Months Has patient been a risk to self within the past 6 months prior to admission? : Yes Suicidal Intent: No-Not Currently/Within Last 6 Months Has patient had any suicidal intent within the past 6 months prior to admission? : Yes Is patient at risk for suicide?: Yes Suicidal Plan?: No-Not Currently/Within Last 6 Months Has patient had any suicidal plan within the past 6 months prior to admission? : Yes Access to Means: Yes Specify Access to Suicidal Means:  (no gun at home but knows where to get it ) Previous Attempts/Gestures: Yes How many times?:  (1x's-cut wrist ) Other Self Harm Risks:  (denies self harm ) Triggers for Past Attempts: Other (Comment) ("Thought my wife would leave me") Intentional Self Injurious Behavior: None (denies ) Recent stressful life event(s): Other (Comment) ("Pain...not sleeping..doctors don't want to help me") Persecutory voices/beliefs?: No Depression: Yes Depression Symptoms: Feeling angry/irritable, Feeling worthless/self pity, Loss of interest in usual pleasures, Fatigue, Isolating Substance abuse history and/or treatment for substance abuse?: No Suicide  prevention information given to non-admitted patients: Not applicable  Risk to Others within the past 6 months Homicidal Ideation: No-Not Currently/Within Last 6 Months Thoughts of Harm to Others: No-Not Currently Present/Within Last 6 Months Current Homicidal Intent: No-Not Currently/Within Last 6 Months Current Homicidal Plan: No-Not Currently/Within Last 6 Months Access to Homicidal Means: Yes Describe Access to Homicidal Means:  ("I don't own a gun but I can go to my old job and get it ) Identified Victim:  (Dr. Bevely Palmer (Neurosurgeon)) History of harm to others?: No Assessment of Violence: None Noted Violent Behavior Description:  (patient is calm and cooperative ) Does patient have access to weapons?: No Criminal Charges Pending?: No Does patient have a court date: No Is patient on probation?: No  Psychosis Hallucinations: None noted Delusions: None noted  Mental Status Report Appearance/Hygiene: In scrubs Eye Contact: Poor (no eye contact ) Motor Activity: Freedom of movement Speech: Logical/coherent Level of Consciousness: Alert Mood: Depressed Affect: Sad Anxiety Level: None Thought Processes: Relevant, Coherent Judgement: Impaired Orientation: Person, Place, Time, Situation Obsessive Compulsive Thoughts/Behaviors: None  Cognitive Functioning Concentration: Decreased Memory: Recent Intact, Remote Intact IQ: Average Insight: Fair Impulse Control: Fair Appetite:  (up and down ) Weight Loss:  (60 pounds since April 2018) Weight Gain:  (none reported) Sleep: Decreased Total Hours of Sleep:  (I don't sleep at all") Vegetative Symptoms: None  ADLScreening [facility] Assessment Services) Patient's cognitive ability adequate to safely complete daily activities?: Yes Patient able to express need for assistance with ADLs?: Yes Independently performs ADLs?: No  Prior Inpatient Therapy Prior Inpatient Therapy: Yes Prior Therapy Dates:  ("I can't remember but it's been a  few years") Prior Therapy Facilty/Provider(s):  [facility]) Reason for Treatment:  (suicidal ideations )  Prior Outpatient Therapy Prior Outpatient Therapy: No Prior Therapy Dates:  (n/a) Prior Therapy Facilty/Provider(s):  (n/a) Reason for Treatment:  (n/a) Does patient have an ACCT team?: No Does patient have Intensive In-House Services?  : No Does patient have [facility] services? : No Does patient have P4CC services?: No  ADL Screening (condition at time of admission) Patient's cognitive ability adequate to safely complete daily activities?: Yes Is the patient deaf or have difficulty hearing?: No Does the patient have difficulty seeing, even when wearing glasses/contacts?: No Does the patient have difficulty concentrating, remembering, or making decisions?: No Patient able to express need for assistance with ADLs?: Yes Does the patient have difficulty dressing or bathing?: Yes Independently performs ADLs?: No Communication: Independent Dressing (OT):  Needs assistance Is this a change from baseline?: Pre-admission baseline Is this a change from baseline?: Pre-admission baseline Feeding: Independent ("I can feed myself but I need someone to assist with preparing my meals") Is this a change from baseline?: Pre-admission baseline Bathing: Needs assistance Is this a change from baseline?: Pre-admission baseline Toileting: Needs assistance Is this a change from baseline?: Pre-admission baseline In/Out Bed: Needs assistance Is this a change from baseline?: Pre-admission baseline Walks in Home: Independent with device (comment), Needs assistance (patient uses a cane) Is this a change from baseline?: Pre-admission baseline Does the patient have difficulty walking or climbing stairs?: Yes (uses a can to ambulate ) Weakness of Legs: Left Weakness of Arms/Hands: Left (unable to use left arm since surgery April 2018)  Home Assistive Devices/Equipment Home Assistive Devices/Equipment: None,  Cane (specify quad or straight)    Abuse/Neglect Assessment (Assessment to be complete while patient is alone) Physical Abuse: Denies Verbal Abuse: Denies Sexual Abuse: Denies Exploitation of patient/patient's resources: Denies Self-Neglect: Denies Values / Beliefs Cultural Requests During Hospitalization: None Spiritual Requests During Hospitalization: None   Advance Directives (For Healthcare) Does Patient Have a Medical Advance Directive?: No Would patient like information on creating a medical advance directive?: No - Patient declined Nutrition Screen- [facility] Adult/[facility] Patient's home diet: Regular  Additional Information 1:1 In Past 12 Months?: No CIRT Risk: No Elopement Risk: No Does patient have medical clearance?: Yes     Disposition:  Disposition Initial Assessment Completed for this Encounter: Yes  On Site Evaluation by:   Reviewed with Physician:    Melynda Ripple 06/13/2017 1:05 PM

## 2017-06-13 NOTE — ED Notes (Signed)
MD at bedside.  EKG given to Isaacs.

## 2017-06-14 ENCOUNTER — Telehealth: Payer: Self-pay | Admitting: Physical Medicine & Rehabilitation

## 2017-06-14 ENCOUNTER — Telehealth: Payer: Self-pay | Admitting: Registered Nurse

## 2017-06-14 DIAGNOSIS — F330 Major depressive disorder, recurrent, mild: Secondary | ICD-10-CM

## 2017-06-14 DIAGNOSIS — R45851 Suicidal ideations: Secondary | ICD-10-CM

## 2017-06-14 LAB — PROTIME-INR
INR: 1.85
PROTHROMBIN TIME: 21.6 s — AB (ref 11.4–15.2)

## 2017-06-14 MED ORDER — DULOXETINE HCL 20 MG PO CPEP: 20.0000 mg | ORAL_CAPSULE | Freq: Every day | ORAL | 0 refills | Status: DC

## 2017-06-14 MED ORDER — HYDROXYZINE HCL 25 MG PO TABS: 25.0000 mg | ORAL_TABLET | Freq: Four times a day (QID) | ORAL | 0 refills | Status: DC | PRN

## 2017-06-14 MED ORDER — DULOXETINE HCL 20 MG PO CPEP
20.0000 mg | ORAL_CAPSULE | Freq: Every day | ORAL | Status: DC
  Filled 2017-06-14: qty 1

## 2017-06-14 MED FILL — DULoxetine HCL 20 MG CPEP: 20 | 30 days supply | Qty: 30 | Fill #0

## 2017-06-14 MED FILL — hydrOXYzine HCL 25 MG TABS: 25 | 10 days supply | Qty: 30 | Fill #0

## 2017-06-14 NOTE — ED Notes (Signed)
Educated patient on f/u with his PCP next week about his elevated blood pressure and to come to ED for any bp symptoms

## 2017-06-14 NOTE — Consult Note (Signed)
[name] Consult   Reason for Consult:  Pain with depression and suicidal ideatiosn Referring Physician:  EDP Patient Identification: Russell Carter MRN:  [id number] Principal Diagnosis: Major depressive disorder, recurrent episode, mild ([place]) Diagnosis:   Patient Active Problem List   Diagnosis Date Noted  . Major depressive disorder, recurrent episode, mild ([place]) [F33.0] 06/14/2017    Priority: High  . Chronic pain disorder [G89.4] 06/05/2017  . Elevated INR [R79.1] 04/27/2017  . Gastrointestinal hemorrhage with melena [K92.1]   . Blood loss anemia [D50.0] 04/25/2017  . Pulmonary embolism ([place]) [I26.99] 02/14/2017  . Fatigue associated with anemia [D64.9]   . Post-operative pain [G89.18]   . Radicular pain [M54.10]   . Diabetes mellitus type 2 in nonobese (HCC) [E11.9]   . Surgery, elective [Z41.9]   . Postoperative pain [G89.18]   . Leukocytosis [D72.829]   . Thrombocytopenia ([place]) [D69.6]   . History of myocardial infarction [I25.2]   . Cocaine abuse [F14.10]   . Depression [F32.9]   . Type 2 diabetes mellitus with hyperglycemia, without long-term current use of insulin (HCC) [E11.65]   . Stage 3 chronic kidney disease [N18.3]   . Benign essential HTN [I10]   . History of CVA (cerebrovascular accident) [Z86.73]   . Prediabetes [R73.03]   . Cervical spondylosis with radiculopathy [M47.22] 01/24/2017  . Dyslipidemia [E78.5] 03/15/2016  . Unstable angina (HCC) - chest pain with complete heart block & chest pain [I20.0] 03/15/2016  . Complete heart block ([place]) [I44.2] 03/14/2016  . Cervical spinal stenosis [M48.02] 03/14/2016  . Chest pain with moderate risk of acute coronary syndrome [R07.9] 03/13/2016  . CAD-50% LAD 2011 [I25.10] 12/14/2014  . Gout [M10.9] 11/16/2014  . Headache [R51] 07/15/2013  . Polysubstance abuse [F19.10] 01/15/2013  . DIZZINESS [R42] 06/05/2010  . Essential hypertension [I10] 05/18/2009  . Acute myocardial infarction ([place]) [I21.9]  05/18/2009  . MUSCLE SPASM [M62.838] 05/18/2009    Total Time spent with patient: 45 minutes  Subjective:   Russell Carter is a 61 y.o. male patient does not warrant admission.  HPI:  61 yo male who presented to the ED after having depression and not having any help, along with chronic pain.  He had a neck fusion in April that was not successful.  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  Edith was not given pain medication at his doctor's office yesterday and he broke down.  When the TTS assessor assessed him, he denied suicidal ideations along with homicidal ideations, no psychosis, or substance abuse.  He reports he feels better after having someone to listen to him, agreeable to go to counseling and start an antidepressant.  STable for discharge.  Past Psychiatric History: depression  Risk to Self: NOne Risk to Others: NOne Prior Inpatient Therapy: Prior Inpatient Therapy: Yes Prior Therapy Dates:  ("I can't remember but it's been a few years") Prior Therapy Facilty/Provider(s):  [facility]) Reason for Treatment:  (suicidal ideations ) Prior Outpatient Therapy: Prior Outpatient Therapy: No Prior Therapy Dates:  (n/a) Prior Therapy Facilty/Provider(s):  (n/a) Reason for Treatment:  (n/a) Does patient have an ACCT team?: No Does patient have Intensive In-House Services?  : No Does patient have [facility] services? : No Does patient have P4CC services?: No  Past Medical History:  Past Medical History:  Diagnosis Date  . Anginal pain ([place])   . Arthritis   . Blood in stool   .  CAD (coronary artery disease)    a. 2011: cath showing mod non-obstructive disease b. 02/2016: cath with 95% stenosis in the Mid RCA. DES placed.  . Cervical stenosis of spine   . Cocaine abuse    history  . Depression   . Diabetes mellitus without complication ([place])   . Gout, unspecified   . Headache   . HTN (hypertension)    . Hypercholesterolemia   . Hyperglycemia   . Migraines   . Myocardial infarction [facility]) 2008   drug cocaine and marijuana at time of mi none  since    . Pneumonia   . Renal insufficiency   . Stroke [facility])     Past Surgical History:  Procedure Laterality Date  . ANTERIOR CERVICAL DECOMP/DISCECTOMY FUSION N/A 01/24/2017   Procedure: Cervical Four-Five,Cervical Five Six,Cervical Six-Seven Anterior cervical discectomy with fusion and plate with Cervical Three-Thoracic- One Laminectomy/Foraminotomy with Cervical Three-Thoracic- One Fixation and fusion;  Surgeon: Kevan Ny Ditty, MD;  Location: [facility];  Service: Neurosurgery;  Laterality: N/A;  Part-1 anterior approach  . CARDIAC CATHETERIZATION  2008; ~ 2011   [place]-40%lad  . CARDIAC CATHETERIZATION N/A 03/16/2016   Procedure: Left Heart Cath and Coronary Angiography;  Surgeon: Leonie Man, MD;  Location: [facility] CV [facility];  Service: Cardiovascular;  Laterality: N/A;  . CORONARY ANGIOPLASTY     STENTS  . EYE SURGERY Bilateral    "for double vision"  . INGUINAL HERNIA REPAIR  05/12/2012   Procedure: HERNIA REPAIR INGUINAL ADULT;  Surgeon: Zenovia Jarred, MD;  Location: [facility];  Service: General;  Laterality: Right;  . INGUINAL HERNIA REPAIR Left 02/02/2003   Dr. Georganna Skeans. repair with mesh. Same procedure done again in  05/22/2004   . INGUINAL HERNIA REPAIR Left ?date 2nd OR  . POSTERIOR CERVICAL FUSION/FORAMINOTOMY N/A 01/24/2017   Procedure: Cervical Three-Thoracic One-Laminectomy/Foraminotomy with Cervical Three-Thoracic One Fixation and fusion;  Surgeon: Kevan Ny Ditty, MD;  Location: [facility];  Service: Neurosurgery;  Laterality: N/A;  Part-2 Posterior approach  . RECONSTRUCT / STABILIZE DISTAL ULNA    . SHOULDER OPEN ROTATOR CUFF REPAIR Left 2010  . SHOULDER SURGERY Left    "injured on the job; had to cut small bone out"  . THUMB FUSION Right    Family History:  Family History  Problem Relation Age  of Onset  . Heart attack Mother        x7. Still alive  . Heart attack Father        deceased b/c of MI  . Heart attack Brother        older, deceased b/c of MI   . Cancer Brother        bone cancer in 1 brother, ? brain cancer in other brother   Family Psychiatric  History: none Social History:  History  Alcohol Use  . Yes    Comment: 16 oz beer every 2-3 days     History  Drug Use  . Types: "Crack" cocaine, Marijuana, Cocaine    Comment: 03/14/2016 "quit 2008"    Social History   Social History  . Marital status: Widowed    Spouse name: N/A  . Number of children: N/A  . Years of education: N/A   Social History Main Topics  . Smoking status: Never Smoker  . Smokeless tobacco: Never Used  . Alcohol use Yes     Comment: 16 oz beer every 2-3 days  . Drug use: Yes    Types: "Crack"  cocaine, Marijuana, Cocaine     Comment: 03/14/2016 "quit 2008"  . Sexual activity: Not Currently   Other Topics Concern  . None   Social History Narrative   Lives in [place] with his wife and son.    Additional Social History:    Allergies:   Allergies  Allergen Reactions  . Shellfish Allergy Anaphylaxis    Labs:  Results for orders placed or performed during the hospital encounter of 06/13/17 (from the past 48 hour(s))  Comprehensive metabolic panel     Status: Abnormal   Collection Time: 06/13/17 11:29 AM  Result Value Ref Range   Sodium 143 135 - 145 mmol/L   Potassium 3.7 3.5 - 5.1 mmol/L   Chloride 112 (H) 101 - 111 mmol/L   CO2 25 22 - 32 mmol/L   Glucose, Bld 111 (H) 65 - 99 mg/dL   BUN 11 6 - 20 mg/dL   Creatinine, Ser 1.25 (H) 0.61 - 1.24 mg/dL   Calcium 9.0 8.9 - 10.3 mg/dL   Total Protein 7.0 6.5 - 8.1 g/dL   Albumin 4.0 3.5 - 5.0 g/dL   AST 17 15 - 41 U/L   ALT 15 (L) 17 - 63 U/L   Alkaline Phosphatase 48 38 - 126 U/L   Total Bilirubin 0.4 0.3 - 1.2 mg/dL   GFR calc non Af Amer >60 >60 mL/min   GFR calc Af Amer >60 >60 mL/min    Comment: (NOTE) The  eGFR has been calculated using the CKD EPI equation. This calculation has not been validated in all clinical situations. eGFR's persistently <60 mL/min signify possible Chronic Kidney Disease.    Anion gap 6 5 - 15  Ethanol     Status: None   Collection Time: 06/13/17 11:29 AM  Result Value Ref Range   Alcohol, Ethyl (B) <5 <5 mg/dL    Comment:        LOWEST DETECTABLE LIMIT FOR SERUM ALCOHOL IS 5 mg/dL FOR MEDICAL PURPOSES ONLY   CBC with Diff     Status: Abnormal   Collection Time: 06/13/17 11:29 AM  Result Value Ref Range   WBC 4.9 4.0 - 10.5 K/uL   RBC 4.74 4.22 - 5.81 MIL/uL   Hemoglobin 12.9 (L) 13.0 - 17.0 g/dL   HCT 40.4 39.0 - 52.0 %   MCV 85.2 78.0 - 100.0 fL   MCH 27.2 26.0 - 34.0 pg   MCHC 31.9 30.0 - 36.0 g/dL   RDW 17.8 (H) 11.5 - 15.5 %   Platelets 241 150 - 400 K/uL   Neutrophils Relative % 62 %   Neutro Abs 3.0 1.7 - 7.7 K/uL   Lymphocytes Relative 29 %   Lymphs Abs 1.4 0.7 - 4.0 K/uL   Monocytes Relative 8 %   Monocytes Absolute 0.4 0.1 - 1.0 K/uL   Eosinophils Relative 1 %   Eosinophils Absolute 0.0 0.0 - 0.7 K/uL   Basophils Relative 0 %   Basophils Absolute 0.0 0.0 - 0.1 K/uL  Salicylate level     Status: None   Collection Time: 06/13/17 11:29 AM  Result Value Ref Range   Salicylate Lvl <8.1 2.8 - 30.0 mg/dL  Acetaminophen level     Status: Abnormal   Collection Time: 06/13/17 11:29 AM  Result Value Ref Range   Acetaminophen (Tylenol), Serum <10 (L) 10 - 30 ug/mL    Comment:        THERAPEUTIC CONCENTRATIONS VARY SIGNIFICANTLY. A RANGE OF 10-30 ug/mL MAY BE AN EFFECTIVE  CONCENTRATION FOR MANY PATIENTS. HOWEVER, SOME ARE BEST TREATED AT CONCENTRATIONS OUTSIDE THIS RANGE. ACETAMINOPHEN CONCENTRATIONS >150 ug/mL AT 4 HOURS AFTER INGESTION AND >50 ug/mL AT 12 HOURS AFTER INGESTION ARE OFTEN ASSOCIATED WITH TOXIC REACTIONS.   Protime-INR     Status: Abnormal   Collection Time: 06/13/17 11:29 AM  Result Value Ref Range   Prothrombin Time  21.6 (H) 11.4 - 15.2 seconds   INR 1.85   CBG monitoring, ED     Status: Abnormal   Collection Time: 06/13/17 11:36 AM  Result Value Ref Range   Glucose-Capillary 118 (H) 65 - 99 mg/dL  Urine rapid drug screen (hosp performed)     Status: None   Collection Time: 06/13/17 11:49 AM  Result Value Ref Range   Opiates NONE DETECTED NONE DETECTED   Cocaine NONE DETECTED NONE DETECTED   Benzodiazepines NONE DETECTED NONE DETECTED   Amphetamines NONE DETECTED NONE DETECTED   Tetrahydrocannabinol NONE DETECTED NONE DETECTED   Barbiturates NONE DETECTED NONE DETECTED    Comment:        DRUG SCREEN FOR MEDICAL PURPOSES ONLY.  IF CONFIRMATION IS NEEDED FOR ANY PURPOSE, NOTIFY LAB WITHIN 5 DAYS.        LOWEST DETECTABLE LIMITS FOR URINE DRUG SCREEN Drug Class       Cutoff (ng/mL) Amphetamine      1000 Barbiturate      200 Benzodiazepine   517 Tricyclics       001 Opiates          300 Cocaine          300 THC              50   Protime-INR     Status: Abnormal   Collection Time: 06/14/17  6:30 AM  Result Value Ref Range   Prothrombin Time 21.6 (H) 11.4 - 15.2 seconds   INR 1.85     Current Facility-Administered Medications  Medication Dose Route Frequency Provider Last Rate Last Dose  . acetaminophen (TYLENOL) tablet 650 mg  650 mg Oral Q4H PRN Pisciotta, Nicole, PA-C   650 mg at 06/14/17 1041  . allopurinol (ZYLOPRIM) tablet 300 mg  300 mg Oral Daily Duffy Bruce, MD   300 mg at 06/14/17 1042  . alum & mag hydroxide-simeth (MAALOX/MYLANTA) 200-200-20 MG/5ML suspension 30 mL  30 mL Oral Q6H PRN Pisciotta, Nicole, PA-C      . carisoprodol (SOMA) tablet 350 mg  350 mg Oral QID PRN Duffy Bruce, MD   350 mg at 06/14/17 0058  . clopidogrel (PLAVIX) tablet 75 mg  75 mg Oral Daily Duffy Bruce, MD   75 mg at 06/14/17 1041  . docusate sodium (COLACE) capsule 100 mg  100 mg Oral BID Duffy Bruce, MD   100 mg at 06/14/17 1041  . DULoxetine (CYMBALTA) DR capsule 20 mg  20 mg Oral  Daily Patrecia Pour, NP      . gabapentin (NEURONTIN) capsule 800 mg  800 mg Oral QID Duffy Bruce, MD   800 mg at 06/14/17 1041  . hydrOXYzine (ATARAX/VISTARIL) tablet 25 mg  25 mg Oral Q6H PRN Patrecia Pour, NP   25 mg at 06/13/17 2216  . nicotine (NICODERM CQ - dosed in mg/24 hours) patch 21 mg  21 mg Transdermal Daily Pisciotta, Nicole, PA-C      . ondansetron (ZOFRAN) tablet 4 mg  4 mg Oral Q8H PRN Pisciotta, Nicole, PA-C      . pantoprazole (PROTONIX) EC tablet 40  mg  40 mg Oral QHS Duffy Bruce, MD   40 mg at 06/13/17 2220  . rosuvastatin (CRESTOR) tablet 40 mg  40 mg Oral QHS Duffy Bruce, MD   40 mg at 06/13/17 2219  . warfarin (COUMADIN) tablet 5 mg  5 mg Oral Daily Duffy Bruce, MD   5 mg at 06/14/17 1042  . Warfarin - Physician Dosing Inpatient   Does not apply [id number] Duffy Bruce, MD       Current Outpatient Prescriptions  Medication Sig Dispense Refill  . allopurinol (ZYLOPRIM) 300 MG tablet Take 1 tablet (300 mg total) by mouth daily. 90 tablet 6  . CALCIUM-VITAMIN D PO Take 1 tablet by mouth daily.    . clopidogrel (PLAVIX) 75 MG tablet Take 1 tablet (75 mg total) by mouth daily. 30 tablet 0  . gabapentin (NEURONTIN) 400 MG capsule Take 2 capsules (800 mg total) by mouth 4 (four) times daily. (Patient taking differently: Take 400 mg by mouth 2 (two) times daily. ) 240 capsule 3  . gabapentin (NEURONTIN) 600 MG tablet Take 600 mg by mouth 4 (four) times daily.    Marland Kitchen ibuprofen (ADVIL,MOTRIN) 800 MG tablet Take 800 mg by mouth daily.    . meclizine (ANTIVERT) 25 MG tablet Take 25 mg by mouth 3 (three) times daily as needed for dizziness.    . methocarbamol (ROBAXIN) 750 MG tablet Take 1 tablet (750 mg total) by mouth every 8 (eight) hours as needed for muscle spasms. 90 tablet 1  . nitroGLYCERIN (NITROSTAT) 0.4 MG SL tablet Place 1 tablet (0.4 mg total) under the tongue every 5 (five) minutes as needed for chest pain. 25 tablet 4  . pantoprazole (PROTONIX) 40 MG  tablet Take 1 tablet (40 mg total) by mouth at bedtime. 30 tablet 1  . rosuvastatin (CRESTOR) 40 MG tablet Take 1 tablet (40 mg total) by mouth at bedtime. 30 tablet 3  . warfarin (COUMADIN) 5 MG tablet Take 1 tablet (5 mg total) by mouth daily. 30 tablet 3  . carisoprodol (SOMA) 350 MG tablet Take 1 tablet (350 mg total) by mouth 4 (four) times daily as needed for muscle spasms. (Patient not taking: Reported on 06/13/2017) 60 tablet 0  . docusate sodium (COLACE) 100 MG capsule Take 1 capsule (100 mg total) by mouth 2 (two) times daily. (Patient not taking: Reported on 06/13/2017) 10 capsule 0    Musculoskeletal: Strength & Muscle Tone: within normal limits Gait & Station: normal Patient leans: N/A  Psychiatric Specialty Exam: Physical Exam  Constitutional: He is oriented to person, place, and time. He appears well-developed and well-nourished.  HENT:  Head: Normocephalic.  Neck: Normal range of motion.  Respiratory: Effort normal.  Musculoskeletal: Normal range of motion.  Neurological: He is alert and oriented to person, place, and time.  Psychiatric: His speech is normal and behavior is normal. Judgment and thought content normal. Cognition and memory are normal. He exhibits a depressed mood.    Review of Systems  Psychiatric/Behavioral: Positive for depression.  All other systems reviewed and are negative.   Blood pressure (!) 156/102, pulse 71, temperature (!) 97.5 F (36.4 C), temperature source Oral, resp. rate 17, height _0  (1.803 m), weight 83.9 kg (185 lb), SpO2 96 %.Body mass index is 25.8 kg/m.  General Appearance: Casual  Eye Contact:  Good  Speech:  Normal Rate  Volume:  Normal  Mood:  Depressed, mild  Affect:  Congruent  Thought Process:  Coherent and Descriptions of Associations: Intact  Orientation:  Full (Time, Place, and Person)  Thought Content:  WDL and Logical  Suicidal Thoughts:  No  Homicidal Thoughts:  No  Memory:  Immediate;   Good Recent;    Good Remote;   Good  Judgement:  Fair  Insight:  Good  Psychomotor Activity:  Normal  Concentration:  Concentration: Good and Attention Span: Good  Recall:  Good  Fund of Knowledge:  Good  Language:  Good  Akathisia:  No  Handed:  Right  AIMS (if indicated):     Assets:  Housing Leisure Time Physical Health Resilience Social Support  ADL's:  Intact  Cognition:  WNL  Sleep:        Treatment Plan Summary: Daily contact with patient to assess and evaluate symptoms and progress in treatment, Medication management and Plan major depressive disorder, recurrent, mild:  -Crisis stabilization -Medication management:  REstarted medical medications except narcotics along with Cymbalta 20 mg daily for depression and pain, Vistaril 25 mg every six hours PRN anxiety -Individual counseling  Disposition: No evidence of imminent risk to self or others at present.    Waylan Boga, NP 06/14/2017 11:16 AM  Patient seen face-to-face for psychiatric evaluation, chart reviewed and case discussed with the physician extender and developed treatment plan. Reviewed the information documented and agree with the treatment plan. Corena Pilgrim, MD

## 2017-06-14 NOTE — Discharge Instructions (Signed)
For your ongoing behavioral health needs you are advised to follow up with [facility].  New patients are seen at their walk-in clinic.  Walk-in hours are Monday - Friday from 8:00 am - 12:00 pm, and from 1:00 pm - 3:00 pm.  Walk-in patients are seen on a first come, first served basis, so try to arrive as early as possible for the best chance of being seen the same day.  There is an initial fee of $22.50: ° °     [facility] °     [address] °     [place], [place] [address] °     [phone] °

## 2017-06-14 NOTE — ED Notes (Signed)
Pt awoke and used restroom; provided and ate full breakfast including all fluids--requested and ate additional food items ([place] sandwich, applesauce, orange juice). Pt expressed no complaints; inquired of time MD would visit. Pt advised MD would come to unit some time later in AM; pt acknowledged understanding. Pt awake but laying in bed/room, no present issues. [employer]

## 2017-06-14 NOTE — Telephone Encounter (Signed)
Mr. Tashiro arrived to office looking for analgesic, stated he was instructed by the [facility] to come and pick up his prescription.  EPIC notes was reviewed, no noted relating to his Psychiatry Evaluation. In June Dr. Riley Kill prescribe Oxycodone 7.5 mg Q 6 hours #60 In July Dr. Bevely Palmer prescribe Oxycodone 7.5 mg #40 On July 11th Dr. Hyman Hopes prescribed Tramadol On 06/05/2017 Dr. Hyman Hopes prescribed T&C#3, Mr. Roxanna Mew stated on 06/13/2017, he never picked up the T&C# 3 he stated it wouldn't work for him. This provider contacted Dr. Riley Kill, we will not be prescribing Mr. Strawderman any analgesics.   Call was placed to [facility] ED, I spoke to Celso Amy NP, she stated Mr. Porchia didn't have any suicidal ideation this morning he was discharged, and they did prescribed Cymbalta and Vistaril. He was instructed to follow up with a counselor. He was never told to come to Physical Medicine Rehabilitation to pick up analgesics.  Ms. Celso Amy stated Dr. Jannifer Franklin overheard the above conversation.

## 2017-06-14 NOTE — ED Notes (Signed)
Psych Provider at bedside.  Dr. A & Dr. Lord 

## 2017-06-14 NOTE — BH Assessment (Signed)
[facility] Assessment Progress Note  Per Thedore Mins, MD, this pt does not require psychiatric hospitalization at this time.  Pt presents under IVC initiated by EDP Shaune Pollack, MD, which Dr Jannifer Franklin has rescinded.  Pt is to be discharged from [facility] with recommendation to follow up with [facility].  This has been included in pt's discharge instructions.  Pt's nurse, Marchelle Folks, has been notified.  Doylene Canning, MA Triage Specialist [phone]

## 2017-06-14 NOTE — ED Notes (Signed)
Pt stated "I was taking oxycodone 5 mg until 3 weeks ago.  Dr. Bevely Palmer said he wouldn't give me anymore.  My other doctor told me I had to be evaluated before they could give me any medicine."

## 2017-06-14 NOTE — ED Notes (Signed)
Bus pass given 

## 2017-06-14 NOTE — Telephone Encounter (Signed)
On 06/13/2017 NCCSR was reviewed, in June and  July Dr. Bevely Palmer was prescribing his Oxycodone. On July 11,2018 his  Tramadol was prescribed by Dr. Trevor Iha Tylenol #3 on 06/05/2017.  Mr. Schindel states he never picked up the Tylenol #3, refer to note. This was  discussed and Narcotic Contract was reviewed and he verbalizes understanding.

## 2017-06-14 NOTE — Telephone Encounter (Signed)
Pt presented to the practice to stated that upon being discharged from the Psych ED at [facility], the clinical staff told him to come to see "Ms. Riley Lam to obtain the prescriptions that he was supposed to get at his appointment the previous day." Pt did present with a prescription that he was given upon discharge. Please advice.

## 2017-06-14 NOTE — ED Notes (Signed)
Pt lying in bed awake watching TV, no complaints or present issues. [employer]

## 2017-06-14 NOTE — [facility] Suicide Risk Assessment (Signed)
Suicide Risk Assessment  Discharge Assessment   [facility] Discharge Suicide Risk Assessment   Principal Problem: Major depressive disorder, recurrent episode, mild (HCC) Discharge Diagnoses:  Patient Active Problem List   Diagnosis Date Noted  . Major depressive disorder, recurrent episode, mild (HCC) [F33.0] 06/14/2017    Priority: High  . Chronic pain disorder [G89.4] 06/05/2017  . Elevated INR [R79.1] 04/27/2017  . Gastrointestinal hemorrhage with melena [K92.1]   . Blood loss anemia [D50.0] 04/25/2017  . Pulmonary embolism (HCC) [I26.99] 02/14/2017  . Fatigue associated with anemia [D64.9]   . Post-operative pain [G89.18]   . Radicular pain [M54.10]   . Diabetes mellitus type 2 in nonobese (HCC) [E11.9]   . Surgery, elective [Z41.9]   . Postoperative pain [G89.18]   . Leukocytosis [D72.829]   . Thrombocytopenia (HCC) [D69.6]   . History of myocardial infarction [I25.2]   . Cocaine abuse [F14.10]   . Depression [F32.9]   . Type 2 diabetes mellitus with hyperglycemia, without long-term current use of insulin (HCC) [E11.65]   . Stage 3 chronic kidney disease [N18.3]   . Benign essential HTN [I10]   . History of CVA (cerebrovascular accident) [Z86.73]   . Prediabetes [R73.03]   . Cervical spondylosis with radiculopathy [M47.22] 01/24/2017  . Dyslipidemia [E78.5] 03/15/2016  . Unstable angina (HCC) - chest pain with complete heart block & chest pain [I20.0] 03/15/2016  . Complete heart block (HCC) [I44.2] 03/14/2016  . Cervical spinal stenosis [M48.02] 03/14/2016  . Chest pain with moderate risk of acute coronary syndrome [R07.9] 03/13/2016  . CAD-50% LAD 2011 [I25.10] 12/14/2014  . Gout [M10.9] 11/16/2014  . Headache [R51] 07/15/2013  . Polysubstance abuse [F19.10] 01/15/2013  . DIZZINESS [R42] 06/05/2010  . Essential hypertension [I10] 05/18/2009  . Acute myocardial infarction (HCC) [I21.9] 05/18/2009  . MUSCLE SPASM [M62.838] 05/18/2009    Total Time spent with patient: 45  minutes   Musculoskeletal: Strength & Muscle Tone: within normal limits Gait & Station: normal Patient leans: N/A  Psychiatric Specialty Exam: Physical Exam  Constitutional: He is oriented to person, place, and time. He appears well-developed and well-nourished.  HENT:  Head: Normocephalic.  Neck: Normal range of motion.  Respiratory: Effort normal.  Musculoskeletal: Normal range of motion.  Neurological: He is alert and oriented to person, place, and time.  Psychiatric: His speech is normal and behavior is normal. Judgment and thought content normal. Cognition and memory are normal. He exhibits a depressed mood.    Review of Systems  Psychiatric/Behavioral: Positive for depression.  All other systems reviewed and are negative.   Blood pressure (!) 156/102, pulse 71, temperature (!) 97.5 F (36.4 C), temperature source Oral, resp. rate 17, height 5\' 11"  (1.803 m), weight 83.9 kg (185 lb), SpO2 96 %.Body mass index is 25.8 kg/m.  General Appearance: Casual  Eye Contact:  Good  Speech:  Normal Rate  Volume:  Normal  Mood:  Depressed, mild  Affect:  Congruent  Thought Process:  Coherent and Descriptions of Associations: Intact  Orientation:  Full (Time, Place, and Person)  Thought Content:  WDL and Logical  Suicidal Thoughts:  No  Homicidal Thoughts:  No  Memory:  Immediate;   Good Recent;   Good Remote;   Good  Judgement:  Fair  Insight:  Good  Psychomotor Activity:  Normal  Concentration:  Concentration: Good and Attention Span: Good  Recall:  Good  Fund of Knowledge:  Good  Language:  Good  Akathisia:  No  Handed:  Right  AIMS (  if indicated):     Assets:  Housing Leisure Time Physical Health Resilience Social Support  ADL's:  Intact  Cognition:  WNL  Sleep:       Mental Status Per Nursing Assessment::   On Admission:   depression with suicidal ideations  Demographic Factors:  Male  Loss Factors: NA  Historical Factors: NA  Risk Reduction  Factors:   Sense of responsibility to family, Living with another person, especially a relative and Positive social support  Continued Clinical Symptoms:  Depression, mild  Cognitive Features That Contribute To Risk:  None    Suicide Risk:  Minimal: No identifiable suicidal ideation.  Patients presenting with no risk factors but with morbid ruminations; may be classified as minimal risk based on the severity of the depressive symptoms    Plan Of Care/Follow-up recommendations:  Activity:  as tolerated Diet:  heart healhty diet  Russell Delpino, NP 06/14/2017, 11:28 AM

## 2017-06-17 NOTE — Telephone Encounter (Signed)
Patient Triage Assessment Form  Todays Date: 04/25/2017 @ 1530 Name: Russell Carter DOB: 12/27/1955 Reason for walkin: Pt had appointment with Clinical Pharmacist for PT/INR check  When did your symptoms start- Pt states he took all medications and felt bad 30 minutes after taking medications. Pt asked if he has vertigo, he responded that he does but didn't have Meclizine with him to take   Please list symptoms: "can't stop shaking, I feel dizzy and weak"  Are you having pain: yes or no  Assessment Vital Signs: 1534 T:  P: 89  R: 16 SpO2: 100% RA  BP: 118/80         120/79  Neuro: Lethargic, responds to name and stimulation  Pupils equal, round, reactive to light  Grips- left arm- moderate, present.            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   Pt states he is unable to smile or frown. No facial assymmetry assessed.  Pt upper body is weak, unable to support himself. He leans forward and short episode of being unresponsive.     Plan EMS called for transfer to ED.  

## 2017-06-18 ENCOUNTER — Emergency Department (HOSPITAL_COMMUNITY)
Admission: EM | Admit: 2017-06-18 | Discharge: 2017-06-18 | Disposition: A | Discharge status: Home / Self Care | Payer: Medicaid Other | Attending: Emergency Medicine | Admitting: Emergency Medicine

## 2017-06-18 ENCOUNTER — Encounter (HOSPITAL_COMMUNITY): Payer: Self-pay | Admitting: Emergency Medicine

## 2017-06-18 DIAGNOSIS — K921 Melena: Secondary | ICD-10-CM | POA: Diagnosis not present

## 2017-06-18 DIAGNOSIS — E119 Type 2 diabetes mellitus without complications: Secondary | ICD-10-CM | POA: Diagnosis not present

## 2017-06-18 DIAGNOSIS — I249 Acute ischemic heart disease, unspecified: Secondary | ICD-10-CM | POA: Diagnosis not present

## 2017-06-18 DIAGNOSIS — Z79899 Other long term (current) drug therapy: Secondary | ICD-10-CM | POA: Insufficient documentation

## 2017-06-18 DIAGNOSIS — R1084 Generalized abdominal pain: Secondary | ICD-10-CM

## 2017-06-18 DIAGNOSIS — R300 Dysuria: Secondary | ICD-10-CM | POA: Diagnosis not present

## 2017-06-18 DIAGNOSIS — R1033 Periumbilical pain: Secondary | ICD-10-CM | POA: Insufficient documentation

## 2017-06-18 DIAGNOSIS — I10 Essential (primary) hypertension: Secondary | ICD-10-CM | POA: Insufficient documentation

## 2017-06-18 DIAGNOSIS — Z7901 Long term (current) use of anticoagulants: Secondary | ICD-10-CM | POA: Insufficient documentation

## 2017-06-18 DIAGNOSIS — I252 Old myocardial infarction: Secondary | ICD-10-CM | POA: Diagnosis not present

## 2017-06-18 LAB — COMPREHENSIVE METABOLIC PANEL
ALBUMIN: 4.1 g/dL (ref 3.5–5.0)
ALT: 16 U/L — AB (ref 17–63)
ANION GAP: 7 (ref 5–15)
AST: 27 U/L (ref 15–41)
Alkaline Phosphatase: 49 U/L (ref 38–126)
BUN: 13 mg/dL (ref 6–20)
CHLORIDE: 106 mmol/L (ref 101–111)
CO2: 27 mmol/L (ref 22–32)
CREATININE: 0.94 mg/dL (ref 0.61–1.24)
Calcium: 9.5 mg/dL (ref 8.9–10.3)
GFR calc non Af Amer: >60 mL/min (ref >60)
GLUCOSE: 91 mg/dL (ref 65–99)
Potassium: 4.2 mmol/L (ref 3.5–5.1)
SODIUM: 140 mmol/L (ref 135–145)
Total Bilirubin: 0.6 mg/dL (ref 0.3–1.2)
Total Protein: 7.4 g/dL (ref 6.5–8.1)

## 2017-06-18 LAB — URINALYSIS, ROUTINE W REFLEX MICROSCOPIC
BACTERIA UA: NONE SEEN
Bilirubin Urine: NEGATIVE
GLUCOSE, UA: NEGATIVE mg/dL
HGB URINE DIPSTICK: NEGATIVE
KETONES UR: NEGATIVE mg/dL
Leukocytes, UA: NEGATIVE
NITRITE: NEGATIVE
PROTEIN: 30 mg/dL — AB
Specific Gravity, Urine: 1.024 (ref 1.005–1.030)
pH: 6 (ref 5.0–8.0)

## 2017-06-18 LAB — CBC WITH DIFFERENTIAL/PLATELET
BASOS PCT: 0 %
Basophils Absolute: 0 10*3/uL (ref 0.0–0.1)
EOS ABS: 0 10*3/uL (ref 0.0–0.7)
EOS PCT: 1 %
HCT: 43.8 % (ref 39.0–52.0)
Hemoglobin: 14.7 g/dL (ref 13.0–17.0)
LYMPHS ABS: 1.5 10*3/uL (ref 0.7–4.0)
Lymphocytes Relative: 26 %
MCH: 27.6 pg (ref 26.0–34.0)
MCHC: 33.6 g/dL (ref 30.0–36.0)
MCV: 82.2 fL (ref 78.0–100.0)
Monocytes Absolute: 0.4 10*3/uL (ref 0.1–1.0)
Monocytes Relative: 6 %
NEUTROS PCT: 67 %
Neutro Abs: 3.7 10*3/uL (ref 1.7–7.7)
PLATELETS: 175 10*3/uL (ref 150–400)
RBC: 5.33 MIL/uL (ref 4.22–5.81)
RDW: 17.3 % — ABNORMAL HIGH (ref 11.5–15.5)
WBC: 5.6 10*3/uL (ref 4.0–10.5)

## 2017-06-18 LAB — LIPASE, BLOOD: Lipase: 20 U/L (ref 11–51)

## 2017-06-18 MED ORDER — FENTANYL CITRATE (PF) 100 MCG/2ML IJ SOLN
50.0000 ug | Freq: Once | INTRAMUSCULAR | Status: AC
  Administered 2017-06-18: 50 ug via INTRAVENOUS
  Filled 2017-06-18: qty 2

## 2017-06-18 MED ORDER — GI COCKTAIL ~~LOC~~
30.0000 mL | Freq: Once | ORAL | Status: AC
  Administered 2017-06-18: 30 mL via ORAL
  Filled 2017-06-18: qty 30

## 2017-06-18 MED ORDER — OMEPRAZOLE 20 MG PO CPDR: 20.0000 mg | DELAYED_RELEASE_CAPSULE | Freq: Every day | ORAL | 0 refills | Status: DC

## 2017-06-18 MED ORDER — ONDANSETRON HCL 4 MG/2ML IJ SOLN
4.0000 mg | Freq: Once | INTRAMUSCULAR | Status: AC
  Administered 2017-06-18: 4 mg via INTRAVENOUS
  Filled 2017-06-18: qty 2

## 2017-06-18 NOTE — Discharge Instructions (Signed)
Please read and follow all provided instructions.  Your diagnoses today include:  1. Generalized abdominal pain     Tests performed today include:  Blood counts and electrolytes  Blood tests to check liver and kidney function  Blood tests to check pancreas function  Urine test to look for infection  Vital signs. See below for your results today.   Medications prescribed:   Omeprazole (Prilosec) - stomach acid reducer  This medication can be found over-the-counter  Take any prescribed medications only as directed.  Home care instructions:   Follow any educational materials contained in this packet.  Follow-up instructions: Please follow-up with your primary care provider in the next 3 days for further evaluation of your symptoms.    Return instructions:  SEEK IMMEDIATE MEDICAL ATTENTION IF:  The pain does not go away or becomes severe   A temperature above 101F develops   Repeated vomiting occurs (multiple episodes)   The pain becomes localized to portions of the abdomen. The right side could possibly be appendicitis. In an adult, the left lower portion of the abdomen could be colitis or diverticulitis.   Blood is being passed in stools or vomit (bright red or black tarry stools)   You develop chest pain, difficulty breathing, dizziness or fainting, or become confused, poorly responsive, or inconsolable (young children)  If you have any other emergent concerns regarding your health  Additional Information: Abdominal (belly) pain can be caused by many things. Your caregiver performed an examination and possibly ordered blood/urine tests and imaging (CT scan, x-rays, ultrasound). Many cases can be observed and treated at home after initial evaluation in the emergency department. Even though you are being discharged home, abdominal pain can be unpredictable. Therefore, you need a repeated exam if your pain does not resolve, returns, or worsens. Most patients with  abdominal pain don't have to be admitted to the hospital or have surgery, but serious problems like appendicitis and gallbladder attacks can start out as nonspecific pain. Many abdominal conditions cannot be diagnosed in one visit, so follow-up evaluations are very important.  Your vital signs today were: BP (!) 154/94 (BP Location: Right Arm)    Pulse 67    Temp 97.9 F (36.6 C) (Oral)    Resp 16    Ht 5\' 11"  (1.803 m)    Wt 83 kg (183 lb)    SpO2 98%    BMI 25.52 kg/m  If your blood pressure (bp) was elevated above 135/85 this visit, please have this repeated by your doctor within one month. --------------

## 2017-06-18 NOTE — ED Notes (Signed)
Pt is CAOX4, warm/dry/pink. Pt has tenderness in RLQ, LLQ and LUQ with guarding present. Pain worsens with eating.

## 2017-06-18 NOTE — ED Notes (Signed)
Pt from home with c/o lower abdominal pain that began on Thursday. Pt also reports painful recurrent urination. Pt reports bright red blood in his stool that began on Saturday. Pt has hx of same in July

## 2017-06-18 NOTE — ED Notes (Signed)
Pt unable to urinate at this time. Will try again shortly.

## 2017-06-18 NOTE — ED Provider Notes (Signed)
[facility]-EMERGENCY DEPT Provider Note   CSN: [id number] Arrival date & time: 06/18/17  1627     History   Chief Complaint Chief Complaint  Patient presents with  . Abdominal Pain  . Weakness  . Dysuria    HPI Russell Carter is a 61 y.o. male.  Patient with history of GI bleed on Plavix, chronic pain, substance abuse, diabetes, CAD, hernia surgery -- presents with complaint of abdominal pain, dysuria ongoing for 3 days. Patient describes periumbilical pain does not radiate. Worse with movement and palpation. Patient has noted bright red blood in his stool. No vomiting or diarrhea. No lightheadedness or syncope. No chest pain or shortness of breath. Patient denies fever, URI symptoms. No recent history of urinary tract infections. No treatments prior to arrival.      Past Medical History:  Diagnosis Date  . Anginal pain (HCC)   . Arthritis   . Blood in stool   . CAD (coronary artery disease)    a. 2011: cath showing mod non-obstructive disease b. 02/2016: cath with 95% stenosis in the Mid RCA. DES placed.  . Cervical stenosis of spine   . Cocaine abuse    history  . Depression   . Diabetes mellitus without complication (HCC)   . Gout, unspecified   . Headache   . HTN (hypertension)   . Hypercholesterolemia   . Hyperglycemia   . Migraines   . Myocardial infarction [facility]) 2008   drug cocaine and marijuana at time of mi none  since    . Pneumonia   . Renal insufficiency   . Stroke [facility])     Patient Active Problem List   Diagnosis Date Noted  . 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 Chronic pain disorder 06/05/2017  . Elevated INR 04/27/2017  . Gastrointestinal hemorrhage with melena   . Blood loss anemia 04/25/2017  . Pulmonary embolism (HCC) 02/14/2017  . Fatigue associated with anemia   . Post-operative pain   . Radicular pain   . Diabetes mellitus type 2 in nonobese (HCC)   . Surgery, elective   . Postoperative pain   .  Leukocytosis   . Thrombocytopenia (HCC)   . History of myocardial infarction   . Cocaine abuse   . Depression   . Type 2 diabetes mellitus with hyperglycemia, without long-term current use of insulin (HCC)   . Stage 3 chronic kidney disease   . Benign essential HTN   . History of CVA (cerebrovascular accident)   . Prediabetes   . Cervical spondylosis with radiculopathy 01/24/2017  . Dyslipidemia 03/15/2016  . Unstable angina (HCC) - chest pain with complete heart block & chest pain 03/15/2016  . Complete heart block (HCC) 03/14/2016  . Cervical spinal stenosis 03/14/2016  . Chest pain with moderate risk of acute coronary syndrome 03/13/2016  . CAD-50% LAD 2011 12/14/2014  . Gout 11/16/2014  . Headache 07/15/2013  . Polysubstance abuse 01/15/2013  . DIZZINESS 06/05/2010  . Essential hypertension 05/18/2009  . Acute myocardial infarction (HCC) 05/18/2009  . MUSCLE SPASM 05/18/2009    Past Surgical History:  Procedure Laterality Date  . ANTERIOR CERVICAL DECOMP/DISCECTOMY FUSION N/A 01/24/2017   Procedure: Cervical Four-Five,Cervical Five Six,Cervical Six-Seven Anterior cervical discectomy with fusion and plate with Cervical Three-Thoracic- One Laminectomy/Foraminotomy with Cervical Three-Thoracic- One Fixation and fusion;  Surgeon: Loura Halt Ditty, MD;  Location: [facility] OR;  Service: Neurosurgery;  Laterality: N/A;  Part-1 anterior approach  . CARDIAC CATHETERIZATION  2008; ~ 2011  [place]-40%lad  . CARDIAC CATHETERIZATION N/A 03/16/2016   Procedure: Left Heart Cath and Coronary Angiography;  Surgeon: Marykay Lex, MD;  Location: [facility] INVASIVE CV LAB;  Service: Cardiovascular;  Laterality: N/A;  . CORONARY ANGIOPLASTY     STENTS  . EYE SURGERY Bilateral    "for double vision"  . INGUINAL HERNIA REPAIR  05/12/2012   Procedure: HERNIA REPAIR INGUINAL ADULT;  Surgeon: Liz Malady, MD;  Location: [facility];  Service: General;  Laterality: Right;  . INGUINAL  HERNIA REPAIR Left 02/02/2003   Dr. Violeta Gelinas. repair with mesh. Same procedure done again in  05/22/2004   . INGUINAL HERNIA REPAIR Left ?date 2nd OR  . POSTERIOR CERVICAL FUSION/FORAMINOTOMY N/A 01/24/2017   Procedure: Cervical Three-Thoracic One-Laminectomy/Foraminotomy with Cervical Three-Thoracic One Fixation and fusion;  Surgeon: Loura Halt Ditty, MD;  Location: [facility] OR;  Service: Neurosurgery;  Laterality: N/A;  Part-2 Posterior approach  . RECONSTRUCT / STABILIZE DISTAL ULNA    . SHOULDER OPEN ROTATOR CUFF REPAIR Left 2010  . SHOULDER SURGERY Left    "injured on the job; had to cut small bone out"  . THUMB FUSION Right        Home Medications    Prior to Admission medications   Medication Sig Start Date End Date Taking? Authorizing Provider  allopurinol (ZYLOPRIM) 300 MG tablet Take 1 tablet (300 mg total) by mouth daily. 05/02/17   Quentin Angst, MD  CALCIUM-VITAMIN D PO Take 1 tablet by mouth daily.    [provider]  carisoprodol (SOMA) 350 MG tablet Take 1 tablet (350 mg total) by mouth 4 (four) times daily as needed for muscle spasms. Patient not taking: Reported on 06/13/2017 06/05/17   Quentin Angst, MD  clopidogrel (PLAVIX) 75 MG tablet Take 1 tablet (75 mg total) by mouth daily. 05/02/17   Quentin Angst, MD  docusate sodium (COLACE) 100 MG capsule Take 1 capsule (100 mg total) by mouth 2 (two) times daily. Patient not taking: Reported on 06/13/2017 02/12/17   Angiulli, Mcarthur Rossetti, PA-C  DULoxetine (CYMBALTA) 20 MG capsule Take 1 capsule (20 mg total) by mouth daily. 06/14/17   Charm Rings, NP  gabapentin (NEURONTIN) 400 MG capsule Take 2 capsules (800 mg total) by mouth 4 (four) times daily. Patient taking differently: Take 400 mg by mouth 2 (two) times daily.  05/02/17   Quentin Angst, MD  gabapentin (NEURONTIN) 600 MG tablet Take 600 mg by mouth 4 (four) times daily.    [provider]  hydrOXYzine (ATARAX/VISTARIL) 25 MG  tablet Take 1 tablet (25 mg total) by mouth every 6 (six) hours as needed for anxiety. 06/14/17   Charm Rings, NP  ibuprofen (ADVIL,MOTRIN) 800 MG tablet Take 800 mg by mouth daily.    [provider]  meclizine (ANTIVERT) 25 MG tablet Take 25 mg by mouth 3 (three) times daily as needed for dizziness.    [provider]  methocarbamol (ROBAXIN) 750 MG tablet Take 1 tablet (750 mg total) by mouth every 8 (eight) hours as needed for muscle spasms. 05/02/17   Quentin Angst, MD  nitroGLYCERIN (NITROSTAT) 0.4 MG SL tablet Place 1 tablet (0.4 mg total) under the tongue every 5 (five) minutes as needed for chest pain. 05/02/17   Quentin Angst, MD  pantoprazole (PROTONIX) 40 MG tablet Take 1 tablet (40 mg total) by mouth at bedtime. 05/02/17   Quentin Angst, MD  rosuvastatin (CRESTOR) 40 MG tablet  Take 1 tablet (40 mg total) by mouth at bedtime. 05/02/17 07/31/17  Quentin Angst, MD  warfarin (COUMADIN) 5 MG tablet Take 1 tablet (5 mg total) by mouth daily. 05/02/17   Quentin Angst, MD    Family History Family History  Problem Relation Age of Onset  . Heart attack Mother        x7. Still alive  . Heart attack Father        deceased b/c of MI  . Heart attack Brother        older, deceased b/c of MI   . Cancer Brother        bone cancer in 1 brother, ? brain cancer in other brother    Social History Social History  Substance Use Topics  . Smoking status: Never Smoker  . Smokeless tobacco: Never Used  . Alcohol use Yes     Comment: 16 oz beer every 2-3 days     Allergies   Shellfish allergy   Review of Systems Review of Systems  Constitutional: Negative for fever.  HENT: Negative for rhinorrhea and sore throat.   Eyes: Negative for redness.  Respiratory: Negative for cough.   Cardiovascular: Negative for chest pain.  Gastrointestinal: Positive for abdominal pain and blood in stool. Negative for diarrhea, nausea and vomiting.    Genitourinary: Positive for dysuria. Negative for flank pain and hematuria.  Musculoskeletal: Negative for myalgias.  Skin: Negative for rash.  Neurological: Negative for headaches.     Physical Exam Updated Vital Signs BP (!) 154/94 (BP Location: Right Arm)   Pulse 67   Temp 97.9 F (36.6 C) (Oral)   Resp 16   Ht 5\' 11"  (1.803 m)   Wt 83 kg (183 lb)   SpO2 98%   BMI 25.52 kg/m   Physical Exam  Constitutional: He appears well-developed and well-nourished.  HENT:  Head: Normocephalic and atraumatic.  Mouth/Throat: Oropharynx is clear and moist.  Eyes: Conjunctivae are normal. Right eye exhibits no discharge. Left eye exhibits no discharge.  Neck: Normal range of motion. Neck supple.  Cardiovascular: Normal rate, regular rhythm and normal heart sounds.   Pulmonary/Chest: Effort normal and breath sounds normal. No respiratory distress. He has no wheezes. He has no rales.  Abdominal: Soft. There is tenderness (periumbilical, mild to moderate). There is no rebound and no guarding.  Genitourinary: Rectum normal. Rectal exam shows no external hemorrhoid and no mass. Prostate is enlarged and tender.  Neurological: He is alert.  Skin: Skin is warm and dry.  Psychiatric: He has a normal mood and affect.  Nursing note and vitals reviewed.    ED Treatments / Results  Labs (all labs ordered are listed, but only abnormal results are displayed) Labs Reviewed  CBC WITH DIFFERENTIAL/PLATELET - Abnormal; Notable for the following:       Result Value   RDW 17.3 (*)    All other components within normal limits  COMPREHENSIVE METABOLIC PANEL - Abnormal; Notable for the following:    ALT 16 (*)    All other components within normal limits  URINALYSIS, ROUTINE W REFLEX MICROSCOPIC - Abnormal; Notable for the following:    Protein, ur 30 (*)    Squamous Epithelial / LPF 0-5 (*)    All other components within normal limits  LIPASE, BLOOD  POC OCCULT BLOOD, ED    EKG  EKG  Interpretation None       Radiology No results found.  Procedures Procedures (including critical care time)  Medications Ordered in ED Medications  fentaNYL (SUBLIMAZE) injection 50 mcg (50 mcg Intravenous Given 06/18/17 1738)  ondansetron (ZOFRAN) injection 4 mg (4 mg Intravenous Given 06/18/17 1737)  gi cocktail (Maalox,Lidocaine,Donnatal) (30 mLs Oral Given 06/18/17 2157)     Initial Impression / Assessment and Plan / ED Course  I have reviewed the triage vital signs and the nursing notes.  Pertinent labs & imaging results that were available during my care of the patient were reviewed by me and considered in my medical decision making (see chart for details).     Patient seen and examined. Work-up initiated. Medications ordered. Rectal exam performed, RN Baxter Hire present. No gross blood.  Vital signs reviewed and are as follows: BP (!) 154/94 (BP Location: Right Arm)   Pulse 67   Temp 97.9 F (36.6 C) (Oral)   Resp 16   Ht 5\' 11"  (1.803 m)   Wt 83 kg (183 lb)   SpO2 98%   BMI 25.52 kg/m   Hemoccult was nonconclusive. Brown stool noted on exam. If the patient does have blood in stool, amount is very low. Hemoglobin is normal.   10:38 PM patient rechecked and he is comfortable. Reviewed findings with patient. Will place on omeprazole.  The patient was urged to return to the Emergency Department immediately with worsening of current symptoms, worsening abdominal pain, persistent vomiting, blood noted in stools, fever, or any other concerns. The patient verbalized understanding.     Final Clinical Impressions(s) / ED Diagnoses   Final diagnoses:  Generalized abdominal pain   Patient with periumbilical abdominal pain. Vitals are stable, no fever. Labs reassuring. Imaging not felt indicated at this time. No signs of dehydration, patient is tolerating PO's. Lungs are clear and no signs suggestive of PNA. Low concern for appendicitis, cholecystitis, pancreatitis,  ruptured viscus, UTI, kidney stone, aortic dissection, aortic aneurysm or other emergent abdominal etiology. No CP or other signs of ACS. Supportive therapy indicated with return if symptoms worsen.   New Prescriptions New Prescriptions   OMEPRAZOLE (PRILOSEC) 20 MG CAPSULE    Take 1 capsule (20 mg total) by mouth daily.     Renne Crigler, PA-C 06/18/17 2242    Marily Memos, MD 06/18/17 [phone]

## 2017-06-20 ENCOUNTER — Ambulatory Visit: Payer: Medicaid Other | Attending: Internal Medicine | Admitting: Physician Assistant

## 2017-06-20 VITALS — BP 131/73 | HR 65 | Temp 98.9°F | Resp 18 | Ht 71.0 in | Wt 174.0 lb

## 2017-06-20 DIAGNOSIS — Z7901 Long term (current) use of anticoagulants: Secondary | ICD-10-CM | POA: Diagnosis not present

## 2017-06-20 DIAGNOSIS — Z8673 Personal history of transient ischemic attack (TIA), and cerebral infarction without residual deficits: Secondary | ICD-10-CM | POA: Diagnosis not present

## 2017-06-20 DIAGNOSIS — I10 Essential (primary) hypertension: Secondary | ICD-10-CM | POA: Insufficient documentation

## 2017-06-20 DIAGNOSIS — G43909 Migraine, unspecified, not intractable, without status migrainosus: Secondary | ICD-10-CM | POA: Diagnosis not present

## 2017-06-20 DIAGNOSIS — F329 Major depressive disorder, single episode, unspecified: Secondary | ICD-10-CM | POA: Diagnosis not present

## 2017-06-20 DIAGNOSIS — I2510 Atherosclerotic heart disease of native coronary artery without angina pectoris: Secondary | ICD-10-CM | POA: Insufficient documentation

## 2017-06-20 DIAGNOSIS — Z09 Encounter for follow-up examination after completed treatment for conditions other than malignant neoplasm: Secondary | ICD-10-CM | POA: Diagnosis not present

## 2017-06-20 DIAGNOSIS — I252 Old myocardial infarction: Secondary | ICD-10-CM | POA: Insufficient documentation

## 2017-06-20 DIAGNOSIS — E1165 Type 2 diabetes mellitus with hyperglycemia: Secondary | ICD-10-CM | POA: Diagnosis not present

## 2017-06-20 DIAGNOSIS — E7800 Pure hypercholesterolemia, unspecified: Secondary | ICD-10-CM | POA: Insufficient documentation

## 2017-06-20 DIAGNOSIS — H9113 Presbycusis, bilateral: Secondary | ICD-10-CM | POA: Diagnosis not present

## 2017-06-20 DIAGNOSIS — H9193 Unspecified hearing loss, bilateral: Secondary | ICD-10-CM | POA: Insufficient documentation

## 2017-06-20 DIAGNOSIS — M109 Gout, unspecified: Secondary | ICD-10-CM | POA: Insufficient documentation

## 2017-06-20 LAB — GLUCOSE, POCT (MANUAL RESULT ENTRY): POC Glucose: 96 mg/dl (ref 70–99)

## 2017-06-20 MED ORDER — MECLIZINE HCL 25 MG PO TABS: 25.0000 mg | ORAL_TABLET | Freq: Three times a day (TID) | ORAL | 1 refills | Status: DC | PRN

## 2017-06-20 MED FILL — ?MECLIZINE 25 MG TABLET: 25 | 10 days supply | Qty: 30 | Fill #0

## 2017-06-20 MED FILL — WARFARIN SODIUM 5 MG TABLET: 5 | 30 days supply | Qty: 30 | Fill #0

## 2017-06-20 MED FILL — OMEPRAZOLE DR 20 MG CAPSULE: 20 | 30 days supply | Qty: 30 | Fill #0

## 2017-06-20 NOTE — Patient Instructions (Signed)
Esophagitis Esophagitis is inflammation of the esophagus. The esophagus is the tube that carries food and liquids from your mouth to your stomach. Esophagitis can cause soreness or pain in the esophagus. This condition can make it difficult and painful to swallow. What are the causes? Most causes of esophagitis are not serious. Common causes of this condition include:  Gastroesophageal reflux disease (GERD). This is when stomach contents move back up into the esophagus (reflux).  Repeated vomiting.  An allergic-type reaction, especially caused by food allergies (eosinophilic esophagitis).  Injury to the esophagus by swallowing large pills with or without water, or swallowing certain types of medicines.  Swallowing (ingesting) harmful chemicals, such as household cleaning products.  Heavy alcohol use.  An infection of the esophagus.This most often occurs in people who have a weakened immune system.  Radiation or chemotherapy treatment for cancer.  Certain diseases such as sarcoidosis, Crohn disease, and scleroderma.  What are the signs or symptoms? Symptoms of this condition include:  Difficult or painful swallowing.  Pain with swallowing acidic liquids, such as citrus juices.  Pain with burping.  Chest pain.  Difficulty breathing.  Nausea.  Vomiting.  Pain in the abdomen.  Weight loss.  Ulcers in the mouth.  Patches of white material in the mouth (candidiasis).  Fever.  Coughing up blood or vomiting blood.  Stool that is black, tarry, or bright red.  How is this diagnosed? Your health care provider will take a medical history and perform a physical exam. You may also have other tests, including:  An endoscopy to examine your stomach and esophagus with a small camera.  A test that measures the acidity level in your esophagus.  A test that measures how much pressure is on your esophagus.  A barium swallow or modified barium swallow to show the shape,  size, and functioning of your esophagus.  Allergy tests.  How is this treated? Treatment for this condition depends on the cause of your esophagitis. In some cases, steroids or other medicines may be given to help relieve your symptoms or to treat the underlying cause of your condition. You may have to make some lifestyle changes, such as:  Avoiding alcohol.  Quitting smoking.  Changing your diet.  Exercising.  Changing your sleep habits and your sleep environment.  Follow these instructions at home: Take these actions to decrease your discomfort and to help avoid complications. Diet  Follow a diet as recommended by your health care provider. This may involve avoiding foods and drinks such as: ? Coffee and tea (with or without caffeine). ? Drinks that contain alcohol. ? Energy drinks and sports drinks. ? Carbonated drinks or sodas. ? Chocolate and cocoa. ? Peppermint and mint flavorings. ? Garlic and onions. ? Horseradish. ? Spicy and acidic foods, including peppers, chili powder, curry powder, vinegar, hot sauces, and barbecue sauce. ? Citrus fruit juices and citrus fruits, such as oranges, lemons, and limes. ? Tomato-based foods, such as red sauce, chili, salsa, and pizza with red sauce. ? Fried and fatty foods, such as donuts, french fries, potato chips, and high-fat dressings. ? High-fat meats, such as hot dogs and fatty cuts of red and white meats, such as rib eye steak, sausage, ham, and bacon. ? High-fat dairy items, such as whole milk, butter, and cream cheese.  Eat small, frequent meals instead of large meals.  Avoid drinking large amounts of liquid with your meals.  Avoid eating meals during the 2-3 hours before bedtime.  Avoid lying down right   after you eat.  Do not exercise right after you eat.  Avoid foods and drinks that seem to make your symptoms worse. General instructions  Pay attention to any changes in your symptoms.  Take over-the-counter and  prescription medicines only as told by your health care provider. Do not take aspirin, ibuprofen, or other NSAIDs unless your health care provider told you to do so.  If you have trouble taking pills, use a pill splitter to decrease the size of the pill. This will decrease the chance of the pill getting stuck or injuring your esophagus on the way down. Also, drink water after you take a pill.  Do not use any tobacco products, including cigarettes, chewing tobacco, and e-cigarettes. If you need help quitting, ask your health care provider.  Wear loose-fitting clothing. Do not wear anything tight around your waist that causes pressure on your abdomen.  Raise (elevate) the head of your bed about 6 inches (15 cm).  Try to reduce your stress, such as with yoga or meditation. If you need help reducing stress, ask your health care provider.  If you are overweight, reduce your weight to an amount that is healthy for you. Ask your health care provider for guidance about a safe weight loss goal.  Keep all follow-up visits as told by your health care provider. This is important. Contact a health care provider if:  You have new symptoms.  You have unexplained weight loss.  You have difficulty swallowing, or it hurts to swallow.  You have wheezing or a persistent cough.  Your symptoms do not improve with treatment.  You have frequent heartburn for more than two weeks. Get help right away if:  You have severe pain in your arms, neck, jaw, teeth, or back.  You feel sweaty, dizzy, or light-headed.  You have chest pain or shortness of breath.  You vomit and your vomit looks like blood or coffee grounds.  Your stool is bloody or black.  You have a fever.  You cannot swallow, drink, or eat. This information is not intended to replace advice given to you by your health care provider. Make sure you discuss any questions you have with your health care provider. Document Released: 11/15/2004  Document Revised: 03/15/2016 Document Reviewed: 02/02/2015 Elsevier Interactive Patient Education  2018 Elsevier Inc.     

## 2017-06-20 NOTE — Progress Notes (Signed)
Russell Carter, is a 61 y.o. male  PTY:[id number]  [id number]  DOB - 1956-08-17  Subjective:  Chief Complaint and HPI: Russell Carter is a 61 y.o. male here for a follow up visit after being seen in the ED 06/18/2017 for abdominal pain and feeling weak.  Labs were essentially unremarkable.  His pain is resolved.  He is feeling good.  Depression is improving on new medications.  Denies mania/psychosis.  He has noticed a decrease in appetite that started when he started with the new meds.  Made appt today due to hearing loss, worse on L than R.   ED/Hospital notes reviewed.     ROS:   Constitutional:  No f/c, No night sweats, No unexplained weight loss. EENT:  No vision changes, No blurry vision, No hearing changes. No mouth, throat, or ear problems other than those listed above.  Respiratory: No cough, No SOB Cardiac: No CP, no palpitations GI:  No abd pain, No N/V/D. GU: No Urinary s/sx Musculoskeletal: No joint pain Neuro: No headache, no dizziness, no motor weakness.  Skin: No rash Endocrine:  No polydipsia. No polyuria.  Psych: Denies SI/HI  No problems updated.  ALLERGIES: Allergies  Allergen Reactions  . Shellfish Allergy Anaphylaxis    PAST MEDICAL HISTORY: Past Medical History:  Diagnosis Date  . Anginal pain (HCC)   . Arthritis   . Blood in stool   . CAD (coronary artery disease)    a. 2011: cath showing mod non-obstructive disease b. 02/2016: cath with 95% stenosis in the Mid RCA. DES placed.  . Cervical stenosis of spine   . Cocaine abuse    history  . Depression   . Diabetes mellitus without complication (HCC)   . Gout, unspecified   . Headache   . HTN (hypertension)   . Hypercholesterolemia   . Hyperglycemia   . Migraines   . Myocardial infarction [facility]) 2008   drug cocaine and marijuana at time of mi none  since    . Pneumonia   . Renal insufficiency   . Stroke [facility])     MEDICATIONS AT HOME: Prior to Admission medications   Medication  Sig Start Date End Date Taking? Authorizing Provider  allopurinol (ZYLOPRIM) 300 MG tablet Take 1 tablet (300 mg total) by mouth daily. 05/02/17  Yes Quentin Angst, MD  CALCIUM-VITAMIN D PO Take 1 tablet by mouth daily.   Yes [provider]  clopidogrel (PLAVIX) 75 MG tablet Take 1 tablet (75 mg total) by mouth daily. 05/02/17  Yes Quentin Angst, MD  DULoxetine (CYMBALTA) 20 MG capsule Take 1 capsule (20 mg total) by mouth daily. 06/14/17  Yes Charm Rings, NP  gabapentin (NEURONTIN) 400 MG capsule Take 2 capsules (800 mg total) by mouth 4 (four) times daily. Patient taking differently: Take 400 mg by mouth 2 (two) times daily.  05/02/17  Yes Quentin Angst, MD  hydrOXYzine (ATARAX/VISTARIL) 25 MG tablet Take 1 tablet (25 mg total) by mouth every 6 (six) hours as needed for anxiety. 06/14/17  Yes Charm Rings, NP  ibuprofen (ADVIL,MOTRIN) 800 MG tablet Take 800 mg by mouth daily.   Yes [provider]  meclizine (ANTIVERT) 25 MG tablet Take 1 tablet (25 mg total) by mouth 3 (three) times daily as needed for dizziness. 06/20/17  Yes Georgian Co M, PA-C  methocarbamol (ROBAXIN) 750 MG tablet Take 1 tablet (750 mg total) by mouth every 8 (eight) hours as needed for muscle spasms. 05/02/17  Yes  Quentin Angst, MD  nitroGLYCERIN (NITROSTAT) 0.4 MG SL tablet Place 1 tablet (0.4 mg total) under the tongue every 5 (five) minutes as needed for chest pain. 05/02/17  Yes Quentin Angst, MD  omeprazole (PRILOSEC) 20 MG capsule Take 1 capsule (20 mg total) by mouth daily. 06/18/17  Yes Renne Crigler, PA-C  pantoprazole (PROTONIX) 40 MG tablet Take 1 tablet (40 mg total) by mouth at bedtime. 05/02/17  Yes Quentin Angst, MD  rosuvastatin (CRESTOR) 40 MG tablet Take 1 tablet (40 mg total) by mouth at bedtime. 05/02/17 07/31/17 Yes Quentin Angst, MD  warfarin (COUMADIN) 5 MG tablet Take 1 tablet (5 mg total) by mouth daily. Patient taking differently:  Take 5-7.5 mg by mouth as directed. Take 1 tablet (5 mg) daily except for Wed Take 1.5 tablets (7.5 mg) 05/02/17  Yes Quentin Angst, MD     Objective:  EXAM:   Vitals:   06/20/17 0954  BP: 131/73  Pulse: 65  Resp: 18  Temp: 98.9 F (37.2 C)  TempSrc: Oral  SpO2: 98%  Weight: 174 lb (78.9 kg)  Height: 5\' 11"  (1.803 m)    General appearance : A&OX3. NAD. Non-toxic-appearing HEENT: Atraumatic and Normocephalic.  PERRLA. EOM intact.  TM clear B. Mouth-MMM, post pharynx WNL w/o erythema, No PND. Neck: supple, no JVD. No cervical lymphadenopathy. No thyromegaly Chest/Lungs:  Breathing-non-labored, Good air entry bilaterally, breath sounds normal without rales, rhonchi, or wheezing  CVS: S1 S2 regular, no murmurs, gallops, rubs  Abdomen: Bowel sounds present, Non tender and not distended with no gaurding, rigidity or rebound. Extremities: Bilateral Lower Ext shows no edema, both legs are warm to touch with = pulse throughout Neurology:  CN II-XII grossly intact, Non focal.   Psych:  TP linear. J/I WNL. Normal speech. Appropriate eye contact and affect.  Skin:  No Rash  Data Review Lab Results  Component Value Date   HGBA1C 4.9 05/01/2017   HGBA1C 6.2 (H) 01/21/2017   HGBA1C 5.9 (H) 03/13/2016     Assessment & Plan   1. Presbycusis of both ears No visible abnormality-small amount of cerumen in R ear but not obstructing - Ambulatory referral to ENT  2. Type 2 diabetes mellitus with hyperglycemia, without long-term current use of insulin (HCC)-currently euglycemic and not on meds - Glucose (CBG) Blood sugar normal today.  Continue current regimen.  3. Follow-up exam-seen in ED 06/18/2017 Pain resolved.  Appetite improving.  He is instructed to eat frequent small meals even if he doesn't feel hungry and stay hydrated/increase water intake.  Patient have been counseled extensively about nutrition and exercise  Return in about 3 months (around 09/18/2017) for keep appt  with Dr Hyman Hopes for follow up.  The patient was given clear instructions to go to ER or return to medical center if symptoms don't improve, worsen or new problems develop. The patient verbalized understanding. The patient was told to call to get lab results if they haven't heard anything in the next week.     Georgian Co, PA-C [facility] [place], [place] [phone]   06/20/2017, 10:12 AMPatient ID: Dietrich Samuelson, male   DOB: [birth year], 61 y.o.   MRN: [id number]

## 2017-06-25 NOTE — Progress Notes (Signed)
Russell Carter, is a 61 y.o. male  ZOX:[id number]  [id number]:[id number]  DOB - July 23, 1956  Chief Complaint  Patient presents with  . Coagulation Disorder       Subjective:   Russell Carter is a 61 y.o. male with medical history of hypertension, polysubstance abuse, diabetes mellitus type 2, coronary artery disease status post PCI of the right coronary artery in 2017, hyperlipidemia, TIA, transient complete heart block resolved after PCI of the RCA, cervical stenosis and chronic kidney disease here today for a sick visit. He is complaining of worsening pain in neck and shoulders that is incapacitating and he feels terrible.Denies any fall or trauma, fevers or chills, bowel or urinary incontinence. He is depressed because of the pain, he feels he can't take it no more. He however denies any suicidal ideation or thought today although he said he feels like that sometimes but has no plan. Patient has No headache, No chest pain, No abdominal pain - No Nausea, No new weakness tingling or numbness, No Cough - SOB.  No problems updated.  ALLERGIES: Allergies  Allergen Reactions  . Shellfish Allergy Anaphylaxis    PAST MEDICAL HISTORY: Past Medical History:  Diagnosis Date  . Anginal pain (HCC)   . Arthritis   . Blood in stool   . CAD (coronary artery disease)    a. 2011: cath showing mod non-obstructive disease b. 02/2016: cath with 95% stenosis in the Mid RCA. DES placed.  . Cervical stenosis of spine   . Cocaine abuse    history  . Depression   . Diabetes mellitus without complication (HCC)   . Gout, unspecified   . Headache   . HTN (hypertension)   . Hypercholesterolemia   . Hyperglycemia   . Migraines   . Myocardial infarction [facility]) 2008   drug cocaine and marijuana at time of mi none  since    . Pneumonia   . Renal insufficiency   . Stroke [facility])     MEDICATIONS AT HOME: Prior to Admission medications   Medication Sig Start Date End Date Taking? Authorizing Provider    allopurinol (ZYLOPRIM) 300 MG tablet Take 1 tablet (300 mg total) by mouth daily. 05/02/17   Quentin Angst, MD  CALCIUM-VITAMIN D PO Take 1 tablet by mouth daily.    [provider]  clopidogrel (PLAVIX) 75 MG tablet Take 1 tablet (75 mg total) by mouth daily. 05/02/17   Quentin Angst, MD  DULoxetine (CYMBALTA) 20 MG capsule Take 1 capsule (20 mg total) by mouth daily. 06/14/17   Charm Rings, NP  gabapentin (NEURONTIN) 400 MG capsule Take 2 capsules (800 mg total) by mouth 4 (four) times daily. Patient taking differently: Take 400 mg by mouth 2 (two) times daily.  05/02/17   Quentin Angst, MD  hydrOXYzine (ATARAX/VISTARIL) 25 MG tablet Take 1 tablet (25 mg total) by mouth every 6 (six) hours as needed for anxiety. 06/14/17   Charm Rings, NP  ibuprofen (ADVIL,MOTRIN) 800 MG tablet Take 800 mg by mouth daily.    [provider]  meclizine (ANTIVERT) 25 MG tablet Take 1 tablet (25 mg total) by mouth 3 (three) times daily as needed for dizziness. 06/20/17   Anders Simmonds, PA-C  methocarbamol (ROBAXIN) 750 MG tablet Take 1 tablet (750 mg total) by mouth every 8 (eight) hours as needed for muscle spasms. 05/02/17   Quentin Angst, MD  nitroGLYCERIN (NITROSTAT) 0.4 MG SL tablet Place 1 tablet (0.4 mg total) under  the tongue every 5 (five) minutes as needed for chest pain. 05/02/17   Quentin Angst, MD  omeprazole (PRILOSEC) 20 MG capsule Take 1 capsule (20 mg total) by mouth daily. 06/18/17   Renne Crigler, PA-C  pantoprazole (PROTONIX) 40 MG tablet Take 1 tablet (40 mg total) by mouth at bedtime. 05/02/17   Quentin Angst, MD  rosuvastatin (CRESTOR) 40 MG tablet Take 1 tablet (40 mg total) by mouth at bedtime. 05/02/17 07/31/17  Quentin Angst, MD  warfarin (COUMADIN) 5 MG tablet Take 1 tablet (5 mg total) by mouth daily. Patient taking differently: Take 5-7.5 mg by mouth as directed. Take 1 tablet (5 mg) daily except for Wed Take 1.5  tablets (7.5 mg) 05/02/17   Quentin Angst, MD    Objective:   Vitals:   06/05/17 1211  BP: (!) 147/83  Pulse: 83  Resp: 18  Temp: 98.5 F (36.9 C)  TempSrc: Oral  SpO2: 100%  Weight: 185 lb (83.9 kg)  Height: 5\' 11"  (1.803 m)   Exam General appearance : Awake, alert, not in any distress. Speech Clear. Not toxic looking HEENT: Atraumatic and Normocephalic, pupils equally reactive to light and accomodation Neck: Supple, no JVD. No cervical lymphadenopathy.  Chest: Good air entry bilaterally, no added sounds  CVS: S1 S2 regular, no murmurs.  Abdomen: Bowel sounds present, Non tender and not distended with no gaurding, rigidity or rebound. Extremities: B/L Lower Ext shows no edema, both legs are warm to touch Neurology: Awake alert, and oriented X 3, CN II-XII intact, Non focal Skin: No Rash  Data Review Lab Results  Component Value Date   HGBA1C 4.9 05/01/2017   HGBA1C 6.2 (H) 01/21/2017   HGBA1C 5.9 (H) 03/13/2016    Assessment & Plan   1. Type 2 diabetes mellitus with hyperglycemia, without long-term current use of insulin (HCC)  Aim for 30 minutes of exercise most days. Rethink what you drink. Water is great! Aim for 2-3 Carb Choices per meal (30-45 grams) +/- 1 either way  Aim for 0-15 Carbs per snack if hungry  Include protein in moderation with your meals and snacks  Consider reading food labels for Total Carbohydrate and Fat Grams of foods  Consider checking BG at alternate times per day  Continue taking medication as directed Be mindful about how much sugar you are adding to beverages and other foods. Fruit Punch - find one with no sugar  Measure and decrease portions of carbohydrate foods  Make your plate and don't go back for seconds  2. Chronic pain disorder  - Continue pain medications as prescribed - Physical Therapy - Pain clinic referral  Patient have been counseled extensively about nutrition and exercise. Other issues discussed during  this visit include: low cholesterol diet, weight control and daily exercise, foot care, annual eye examinations at Ophthalmology, importance of adherence with medications and regular follow-up. We also discussed long term complications of uncontrolled diabetes and hypertension.   Return in about 3 months (around 09/05/2017) for Follow up Pain and comorbidities, Pulmonary Embolism and Anticoagulation.  The patient was given clear instructions to go to ER or return to medical center if symptoms don't improve, worsen or new problems develop. The patient verbalized understanding. The patient was told to call to get lab results if they haven't heard anything in the next week.   This note has been created with Education officer, environmental. Any transcriptional errors are unintentional.    Amritha Yorke,  MD, MHA, Maxwell Caul, CPE [facility] and [facility] [place], [place] [phone]   06/25/2017, 6:32 PM

## 2017-06-28 ENCOUNTER — Telehealth: Payer: Self-pay

## 2017-06-28 NOTE — Telephone Encounter (Signed)
Called the patient and reviewed the SCAT application

## 2017-07-01 NOTE — Telephone Encounter (Signed)
Completed SCAT application faxed to SCAT eligibility.  

## 2017-07-03 ENCOUNTER — Encounter: Payer: Self-pay | Admitting: Occupational Therapy

## 2017-07-03 NOTE — Therapy (Signed)
[facility] [address] [place], [place], [address] Phone: [phone]   Fax:  [phone]  Patient Details  Name: Russell Carter MRN: [id number] Date of Birth: [birth year] Referring Provider:  No ref. provider found  Encounter Date: 07/03/2017  OCCUPATIONAL THERAPY DISCHARGE SUMMARY  Visits from Start of Care: 1   Plan:                                                    Patient goals were not met. Patient is being discharged due to not returning since the last visit.  ?????     Mariah Milling , OTR/L 07/03/2017, 2:59 PM  [facility] [address] [place], [place], [address] Phone: [phone]   Fax:  [phone]

## 2017-07-13 ENCOUNTER — Emergency Department (HOSPITAL_COMMUNITY)
Admission: EM | Admit: 2017-07-13 | Discharge: 2017-07-14 | Disposition: A | Discharge status: Home / Self Care | Payer: Medicaid Other | Attending: Emergency Medicine | Admitting: Emergency Medicine

## 2017-07-13 ENCOUNTER — Encounter (HOSPITAL_COMMUNITY): Payer: Self-pay | Admitting: Emergency Medicine

## 2017-07-13 ENCOUNTER — Emergency Department (HOSPITAL_COMMUNITY): Payer: Medicaid Other

## 2017-07-13 DIAGNOSIS — I10 Essential (primary) hypertension: Secondary | ICD-10-CM | POA: Diagnosis not present

## 2017-07-13 DIAGNOSIS — I252 Old myocardial infarction: Secondary | ICD-10-CM | POA: Insufficient documentation

## 2017-07-13 DIAGNOSIS — Z79899 Other long term (current) drug therapy: Secondary | ICD-10-CM | POA: Diagnosis not present

## 2017-07-13 DIAGNOSIS — Z8673 Personal history of transient ischemic attack (TIA), and cerebral infarction without residual deficits: Secondary | ICD-10-CM | POA: Insufficient documentation

## 2017-07-13 DIAGNOSIS — I2510 Atherosclerotic heart disease of native coronary artery without angina pectoris: Secondary | ICD-10-CM | POA: Insufficient documentation

## 2017-07-13 DIAGNOSIS — R51 Headache: Secondary | ICD-10-CM | POA: Diagnosis not present

## 2017-07-13 DIAGNOSIS — R791 Abnormal coagulation profile: Secondary | ICD-10-CM

## 2017-07-13 DIAGNOSIS — E7800 Pure hypercholesterolemia, unspecified: Secondary | ICD-10-CM | POA: Diagnosis not present

## 2017-07-13 DIAGNOSIS — E119 Type 2 diabetes mellitus without complications: Secondary | ICD-10-CM | POA: Diagnosis not present

## 2017-07-13 DIAGNOSIS — R519 Headache, unspecified: Secondary | ICD-10-CM

## 2017-07-13 MED ORDER — ONDANSETRON HCL 4 MG/2ML IJ SOLN
4.0000 mg | Freq: Once | INTRAMUSCULAR | Status: AC
  Administered 2017-07-13: 4 mg via INTRAVENOUS
  Filled 2017-07-13: qty 2

## 2017-07-13 MED ORDER — MORPHINE SULFATE (PF) 4 MG/ML IV SOLN
4.0000 mg | Freq: Once | INTRAVENOUS | Status: AC
  Administered 2017-07-13: 4 mg via INTRAVENOUS
  Filled 2017-07-13: qty 1

## 2017-07-13 NOTE — ED Triage Notes (Signed)
Pt arrives with c/o headaches ongoing x2 weeks. Reports HTN, dizziness onset today prompting EMS arrival. Pt recently taken off of coumadin for GI bleed - was taking for DVT/PE hx. Pt states that he has been taking motrin and tylenol with codeine for pain with no relief.

## 2017-07-13 NOTE — ED Provider Notes (Signed)
[facility]-EMERGENCY DEPT Provider Note   CSN: [id number] Arrival date & time: 07/13/17  2308     History   Chief Complaint Chief Complaint  Patient presents with  . Headache    HPI Russell Carter is a 61 y.o. male.  The history is provided by the patient.  he has history of coronary artery disease, diabetes, hypertension, DVT and is currently anticoagulated with warfarin. He complains of right-sided headache for the last 2 weeks which is getting worse. Headache was intermittent initially, but is now constant. Pain is severe and he rates at 10/10. He has had some blurred vision during that time. Pain is worse with exposure to light, but nothing has made it any better. He is taken acetaminophen with codeine and ibuprofen without relief. He denies nausea or vomiting and denies any with focal weakness. He has not had similar headaches before. He has also noted that he has been off balance. Tonight he fell against a refrigerator. He does no falls prior to tonight.  Past Medical History:  Diagnosis Date  . Anginal pain (HCC)   . Arthritis   . Blood in stool   . CAD (coronary artery disease)    a. 2011: cath showing mod non-obstructive disease b. 02/2016: cath with 95% stenosis in the Mid RCA. DES placed.  . Cervical stenosis of spine   . Cocaine abuse    history  . Depression   . Diabetes mellitus without complication (HCC)   . Gout, unspecified   . Headache   . HTN (hypertension)   . Hypercholesterolemia   . Hyperglycemia   . Migraines   . Myocardial infarction [facility]) 2008   drug cocaine and marijuana at time of mi none  since    . Pneumonia   . Renal insufficiency   . Stroke [facility])     Patient Active Problem List   Diagnosis Date Noted  . 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 Chronic pain disorder 06/05/2017  . Elevated INR 04/27/2017  . Gastrointestinal hemorrhage with melena   . Blood loss anemia 04/25/2017  . Pulmonary embolism (HCC)  02/14/2017  . Fatigue associated with anemia   . Post-operative pain   . Radicular pain   . Diabetes mellitus type 2 in nonobese (HCC)   . Surgery, elective   . Postoperative pain   . Leukocytosis   . Thrombocytopenia (HCC)   . History of myocardial infarction   . Cocaine abuse   . Depression   . Type 2 diabetes mellitus with hyperglycemia, without long-term current use of insulin (HCC)   . Stage 3 chronic kidney disease   . Benign essential HTN   . History of CVA (cerebrovascular accident)   . Prediabetes   . Cervical spondylosis with radiculopathy 01/24/2017  . Dyslipidemia 03/15/2016  . Unstable angina (HCC) - chest pain with complete heart block & chest pain 03/15/2016  . Complete heart block (HCC) 03/14/2016  . Cervical spinal stenosis 03/14/2016  . Chest pain with moderate risk of acute coronary syndrome 03/13/2016  . CAD-50% LAD 2011 12/14/2014  . Gout 11/16/2014  . Headache 07/15/2013  . Polysubstance abuse 01/15/2013  . DIZZINESS 06/05/2010  . Essential hypertension 05/18/2009  . Acute myocardial infarction (HCC) 05/18/2009  . MUSCLE SPASM 05/18/2009    Past Surgical History:  Procedure Laterality Date  . ANTERIOR CERVICAL DECOMP/DISCECTOMY FUSION N/A 01/24/2017   Procedure: Cervical Four-Five,Cervical Five Six,Cervical Six-Seven Anterior cervical discectomy with fusion and plate with Cervical Three-Thoracic- One Laminectomy/Foraminotomy with  Cervical Three-Thoracic- One Fixation and fusion;  Surgeon: Loura Halt Ditty, MD;  Location: [facility] OR;  Service: Neurosurgery;  Laterality: N/A;  Part-1 anterior approach  . CARDIAC CATHETERIZATION  2008; ~ 2011   [place]-40%lad  . CARDIAC CATHETERIZATION N/A 03/16/2016   Procedure: Left Heart Cath and Coronary Angiography;  Surgeon: Marykay Lex, MD;  Location: [facility] INVASIVE CV LAB;  Service: Cardiovascular;  Laterality: N/A;  . CORONARY ANGIOPLASTY     STENTS  . EYE SURGERY Bilateral    "for double vision"  . INGUINAL  HERNIA REPAIR  05/12/2012   Procedure: HERNIA REPAIR INGUINAL ADULT;  Surgeon: Liz Malady, MD;  Location: [facility];  Service: General;  Laterality: Right;  . INGUINAL HERNIA REPAIR Left 02/02/2003   Dr. Violeta Gelinas. repair with mesh. Same procedure done again in  05/22/2004   . INGUINAL HERNIA REPAIR Left ?date 2nd OR  . POSTERIOR CERVICAL FUSION/FORAMINOTOMY N/A 01/24/2017   Procedure: Cervical Three-Thoracic One-Laminectomy/Foraminotomy with Cervical Three-Thoracic One Fixation and fusion;  Surgeon: Loura Halt Ditty, MD;  Location: [facility] OR;  Service: Neurosurgery;  Laterality: N/A;  Part-2 Posterior approach  . RECONSTRUCT / STABILIZE DISTAL ULNA    . SHOULDER OPEN ROTATOR CUFF REPAIR Left 2010  . SHOULDER SURGERY Left    "injured on the job; had to cut small bone out"  . THUMB FUSION Right        Home Medications    Prior to Admission medications   Medication Sig Start Date End Date Taking? Authorizing Provider  allopurinol (ZYLOPRIM) 300 MG tablet Take 1 tablet (300 mg total) by mouth daily. 05/02/17   Quentin Angst, MD  CALCIUM-VITAMIN D PO Take 1 tablet by mouth daily.    [provider]  clopidogrel (PLAVIX) 75 MG tablet Take 1 tablet (75 mg total) by mouth daily. 05/02/17   Quentin Angst, MD  DULoxetine (CYMBALTA) 20 MG capsule Take 1 capsule (20 mg total) by mouth daily. 06/14/17   Charm Rings, NP  gabapentin (NEURONTIN) 400 MG capsule Take 2 capsules (800 mg total) by mouth 4 (four) times daily. Patient taking differently: Take 400 mg by mouth 2 (two) times daily.  05/02/17   Quentin Angst, MD  hydrOXYzine (ATARAX/VISTARIL) 25 MG tablet Take 1 tablet (25 mg total) by mouth every 6 (six) hours as needed for anxiety. 06/14/17   Charm Rings, NP  ibuprofen (ADVIL,MOTRIN) 800 MG tablet Take 800 mg by mouth daily.    [provider]  meclizine (ANTIVERT) 25 MG tablet Take 1 tablet (25 mg total) by mouth 3 (three) times  daily as needed for dizziness. 06/20/17   Anders Simmonds, PA-C  methocarbamol (ROBAXIN) 750 MG tablet Take 1 tablet (750 mg total) by mouth every 8 (eight) hours as needed for muscle spasms. 05/02/17   Quentin Angst, MD  nitroGLYCERIN (NITROSTAT) 0.4 MG SL tablet Place 1 tablet (0.4 mg total) under the tongue every 5 (five) minutes as needed for chest pain. 05/02/17   Quentin Angst, MD  omeprazole (PRILOSEC) 20 MG capsule Take 1 capsule (20 mg total) by mouth daily. 06/18/17   Renne Crigler, PA-C  pantoprazole (PROTONIX) 40 MG tablet Take 1 tablet (40 mg total) by mouth at bedtime. 05/02/17   Quentin Angst, MD  rosuvastatin (CRESTOR) 40 MG tablet Take 1 tablet (40 mg total) by mouth at bedtime. 05/02/17 07/31/17  Quentin Angst, MD  warfarin (COUMADIN) 5 MG tablet Take 1 tablet (5 mg  total) by mouth daily. Patient taking differently: Take 5-7.5 mg by mouth as directed. Take 1 tablet (5 mg) daily except for Wed Take 1.5 tablets (7.5 mg) 05/02/17   Quentin Angst, MD    Family History Family History  Problem Relation Age of Onset  . Heart attack Mother        x7. Still alive  . Heart attack Father        deceased b/c of MI  . Heart attack Brother        older, deceased b/c of MI   . Cancer Brother        bone cancer in 1 brother, ? brain cancer in other brother    Social History Social History  Substance Use Topics  . Smoking status: Never Smoker  . Smokeless tobacco: Never Used  . Alcohol use Yes     Comment: 16 oz beer every 2-3 days     Allergies   Shellfish allergy   Review of Systems Review of Systems  All other systems reviewed and are negative.    Physical Exam Updated Vital Signs BP (!) 174/112 (BP Location: Right Arm)   Pulse 66   Temp 98.3 F (36.8 C) (Oral)   Resp 16   Ht  (1.803 m)   Wt 78.5 kg (173 lb)   SpO2 98%   BMI 24.13 kg/m   Physical Exam  Nursing note and vitals reviewed.  61 year old male, resting  comfortably and in no acute distress. Vital signs are significant for hypertension. Oxygen saturation is 98%, which is normal. Head is normocephalic and atraumatic. PERRLA, EOMI. Oropharynx is clear. Neck is nontender without adenopathy or JVD. Meningismus is present. Back is nontender and there is no CVA tenderness. Lungs are clear without rales, wheezes, or rhonchi. Chest is nontender. Heart has regular rate and rhythm without murmur. Abdomen is soft, flat, nontender without masses or hepatosplenomegaly and peristalsis is normoactive. Extremities have no cyanosis or edema, full range of motion is present. Skin is warm and dry without rash. Neurologic: Mental status is normal, cranial nerves are intact, there are no motor or sensory deficits.  ED Treatments / Results  Labs (all labs ordered are listed, but only abnormal results are displayed) Labs Reviewed  CBC WITH DIFFERENTIAL/PLATELET - Abnormal; Notable for the following:       Result Value   RDW 16.9 (*)    All other components within normal limits  PROTIME-INR - Abnormal; Notable for the following:    Prothrombin Time 19.5 (*)    All other components within normal limits  BASIC METABOLIC PANEL   Radiology Ct Head Wo Contrast  Result Date: 07/13/2017 CLINICAL DATA:  Headache for 2 weeks EXAM: CT HEAD WITHOUT CONTRAST TECHNIQUE: Contiguous axial images were obtained from the base of the skull through the vertex without intravenous contrast. COMPARISON:  MRI 05/15/2017, CT brain 05/15/2017 FINDINGS: Brain: No acute territorial infarction, hemorrhage or intracranial mass is seen. Minimal small vessel ischemic changes of the white matter. Stable ventricle size. Vascular: No hyperdense vessels. Minimal calcification at the carotid siphon. Skull: Normal. Negative for fracture or focal lesion. Sinuses/Orbits: Mucosal thickening in the maxillary and ethmoid sinuses. No acute orbital abnormality. Other: None IMPRESSION: No CT evidence for  acute intracranial abnormality Electronically Signed   By: Jasmine Pang M.D.   On: 07/13/2017 23:47    Procedures Procedures (including critical care time)  Medications Ordered in ED Medications  ondansetron (ZOFRAN) injection 4 mg (4 mg  Intravenous Given 07/13/17 2347)  morphine 4 MG/ML injection 4 mg (4 mg Intravenous Given 07/13/17 2347)  sodium chloride 0.9 % bolus 1,000 mL (0 mLs Intravenous Stopped 07/14/17 0228)  metoCLOPramide (REGLAN) injection 10 mg (10 mg Intravenous Given 07/14/17 0104)  diphenhydrAMINE (BENADRYL) injection 25 mg (25 mg Intravenous Given 07/14/17 0104)  ketorolac (TORADOL) 30 MG/ML injection 30 mg (30 mg Intravenous Given 07/14/17 0104)  dexamethasone (DECADRON) injection 10 mg (10 mg Intravenous Given 07/14/17 0253)     Initial Impression / Assessment and Plan / ED Course  I have reviewed the triage vital signs and the nursing notes.  Pertinent labs & imaging results that were available during my care of the patient were reviewed by me and considered in my medical decision making (see chart for details).  Severe headache in patient who is anticoagulated. This is concerning for intracranial bleed. He'll be sent for CT of head, and INR will be checked. Of note, patient states she has not had his INR checked recently. Old records are reviewed confirming diagnosis of DVT in June. Last INR recorded was 1.85 on August 24.  CT was normal. INR is significantly subtherapeutic. He had no relief of his headache whatsoever with a small dose of morphine. Since his CT scan did not show any pathology, he is given a headache cocktail of normal saline, metoclopramide, diphenhydramine. He had excellent relief of his headache with this. He no longer had any meningismus and was able to move his head voluntarily through a full passive range of motion. It was felt that he did not need lumbar puncture given his response to the medication and normalization of his exam. He is discharged with  prescription for metoclopramide and referred back to his PCP. Return precautions discussed. Also, he is advised to contact the Coumadin clinic for instructions on how to adjust his warfarin dose.  Final Clinical Impressions(s) / ED Diagnoses   Final diagnoses:  Bad headache  Subtherapeutic international normalized ratio (INR)    New Prescriptions New Prescriptions   METOCLOPRAMIDE (REGLAN) 10 MG TABLET    Take 1 tablet (10 mg total) by mouth every 6 (six) hours as needed for nausea (or headache).     Dione Booze, MD 07/14/17 [phone]

## 2017-07-13 NOTE — ED Notes (Signed)
To CT at this time.

## 2017-07-13 NOTE — ED Triage Notes (Signed)
Now also reports fall with head injury today. States he fell into freezer.

## 2017-07-14 LAB — BASIC METABOLIC PANEL
Anion gap: 7 (ref 5–15)
BUN: 10 mg/dL (ref 6–20)
CALCIUM: 9.3 mg/dL (ref 8.9–10.3)
CHLORIDE: 108 mmol/L (ref 101–111)
CO2: 23 mmol/L (ref 22–32)
CREATININE: 0.78 mg/dL (ref 0.61–1.24)
GFR calc non Af Amer: >60 mL/min (ref >60)
GLUCOSE: 88 mg/dL (ref 65–99)
Potassium: 3.7 mmol/L (ref 3.5–5.1)
Sodium: 138 mmol/L (ref 135–145)

## 2017-07-14 LAB — CBC WITH DIFFERENTIAL/PLATELET
BASOS PCT: 0 %
Basophils Absolute: 0 10*3/uL (ref 0.0–0.1)
EOS ABS: 0.1 10*3/uL (ref 0.0–0.7)
EOS PCT: 1 %
HCT: 42.8 % (ref 39.0–52.0)
Hemoglobin: 14.4 g/dL (ref 13.0–17.0)
LYMPHS ABS: 1.3 10*3/uL (ref 0.7–4.0)
Lymphocytes Relative: 24 %
MCH: 27.5 pg (ref 26.0–34.0)
MCHC: 33.6 g/dL (ref 30.0–36.0)
MCV: 81.7 fL (ref 78.0–100.0)
MONO ABS: 0.4 10*3/uL (ref 0.1–1.0)
MONOS PCT: 8 %
NEUTROS PCT: 67 %
Neutro Abs: 3.7 10*3/uL (ref 1.7–7.7)
PLATELETS: 253 10*3/uL (ref 150–400)
RBC: 5.24 MIL/uL (ref 4.22–5.81)
RDW: 16.9 % — AB (ref 11.5–15.5)
WBC: 5.5 10*3/uL (ref 4.0–10.5)

## 2017-07-14 LAB — PROTIME-INR
INR: 1.66
PROTHROMBIN TIME: 19.5 s — AB (ref 11.4–15.2)

## 2017-07-14 MED ORDER — DIPHENHYDRAMINE HCL 50 MG/ML IJ SOLN
25.0000 mg | Freq: Once | INTRAMUSCULAR | Status: AC
  Administered 2017-07-14: 25 mg via INTRAVENOUS
  Filled 2017-07-14: qty 1

## 2017-07-14 MED ORDER — METOCLOPRAMIDE HCL 10 MG PO TABS: 10.0000 mg | ORAL_TABLET | Freq: Four times a day (QID) | ORAL | 0 refills | Status: DC | PRN

## 2017-07-14 MED ORDER — METOCLOPRAMIDE HCL 5 MG/ML IJ SOLN
10.0000 mg | Freq: Once | INTRAMUSCULAR | Status: AC
  Administered 2017-07-14: 10 mg via INTRAVENOUS
  Filled 2017-07-14: qty 2

## 2017-07-14 MED ORDER — KETOROLAC TROMETHAMINE 30 MG/ML IJ SOLN
30.0000 mg | Freq: Once | INTRAMUSCULAR | Status: AC
  Administered 2017-07-14: 30 mg via INTRAVENOUS
  Filled 2017-07-14: qty 1

## 2017-07-14 MED ORDER — SODIUM CHLORIDE 0.9 % IV BOLUS (SEPSIS)
1000.0000 mL | Freq: Once | INTRAVENOUS | Status: AC
  Administered 2017-07-14: 1000 mL via INTRAVENOUS

## 2017-07-14 MED ORDER — DEXAMETHASONE SODIUM PHOSPHATE 10 MG/ML IJ SOLN
10.0000 mg | Freq: Once | INTRAMUSCULAR | Status: AC
  Administered 2017-07-14: 10 mg via INTRAVENOUS
  Filled 2017-07-14: qty 1

## 2017-07-14 NOTE — Discharge Instructions (Signed)
Your INR (blood test for warfarin) was too low - 1.66. It is supposed to be 2.0-3.0. Please call the clinic that is monitoring it and have them give you instructions on how to adjust your dose.  Return if symptoms are getting worse.

## 2017-07-14 NOTE — ED Notes (Signed)
Patient left at this time with all belongings. 

## 2017-07-17 ENCOUNTER — Ambulatory Visit: Payer: Medicaid Other | Attending: Internal Medicine | Admitting: Pharmacist

## 2017-07-17 ENCOUNTER — Telehealth: Payer: Self-pay | Admitting: *Deleted

## 2017-07-17 DIAGNOSIS — Z5181 Encounter for therapeutic drug level monitoring: Secondary | ICD-10-CM | POA: Insufficient documentation

## 2017-07-17 DIAGNOSIS — I2782 Chronic pulmonary embolism: Secondary | ICD-10-CM | POA: Diagnosis not present

## 2017-07-17 LAB — POCT INR: INR: 1.8

## 2017-07-17 NOTE — Progress Notes (Signed)
    Pharmacy Anticoagulation Clinic  Subjective: Patient presents today for INR monitoring. Anticoagulation indication is PE.   Current dose of warfarin: 5 mg daily  Adherence to warfarin: denies any missed doses. He denies any blood in the stool or urine or any other s/sx of bleeding.   Denies SOB, chest pain, headache, leg pain or swelling.    Objective: Today's INR = 1.8  Lab Results  Component Value Date   INR 1.8 07/17/2017   INR 1.66 07/13/2017   INR 1.85 06/14/2017     Assessment and Plan: Anticoagulation: Patient is subtherapeutic but trending up from a few days ago. He denies any missed doses so will increase to 1.5 tablets today and then 1 tablet daily and check INR next week. Stressed the importance of following with me regularly for INR checks and warfarin adjustment to avoid bleeding or clotting.

## 2017-07-17 NOTE — Telephone Encounter (Signed)
Letter of discharge mailed

## 2017-07-23 ENCOUNTER — Other Ambulatory Visit: Payer: Self-pay | Admitting: Internal Medicine

## 2017-07-23 NOTE — Telephone Encounter (Signed)
Call placed to patient #[phone] in regards to his [employer] application. No asnwer. Left message for patient to return my call at [phone]. Wanted to ask patient if he had received a call from [employer] in order to schedule his evaluation.

## 2017-07-24 ENCOUNTER — Ambulatory Visit: Payer: Medicaid Other | Attending: Internal Medicine | Admitting: Pharmacist

## 2017-07-24 DIAGNOSIS — I2782 Chronic pulmonary embolism: Secondary | ICD-10-CM | POA: Diagnosis not present

## 2017-07-24 DIAGNOSIS — R791 Abnormal coagulation profile: Secondary | ICD-10-CM | POA: Insufficient documentation

## 2017-07-24 LAB — POCT INR: INR: 1.4

## 2017-07-24 MED ORDER — WARFARIN SODIUM 5 MG PO TABS: ORAL_TABLET | ORAL | 2 refills | Status: DC

## 2017-07-24 MED FILL — GABAPENTIN 600 MG TABLET: 600 | 30 days supply | Qty: 90 | Fill #0

## 2017-07-24 MED FILL — PANTOPRAZOLE SOD DR 40 MG T: 40 | 30 days supply | Qty: 30 | Fill #1

## 2017-07-24 MED FILL — ALLOPURINOL 300 MG TABS: 300 | 30 days supply | Qty: 30 | Fill #0

## 2017-07-24 MED FILL — ROSUVASTATIN CALCIUM 40 MG: 40 | 30 days supply | Qty: 30 | Fill #0

## 2017-07-24 MED FILL — AMLODIPINE BESYLATE 5 MG TA: 5 | 30 days supply | Qty: 30 | Fill #0

## 2017-07-24 MED FILL — FERROUS SULFATE 325 MG TAB: 325 (65 FE) | 30 days supply | Qty: 90 | Fill #1

## 2017-07-24 MED FILL — LOSARTAN POTASSIUM 100 MG T: 100 | 30 days supply | Qty: 30 | Fill #0

## 2017-07-24 MED FILL — WARFARIN SODIUM 5 MG TABLET: 5 | 30 days supply | Qty: 45 | Fill #0

## 2017-07-24 MED FILL — METOCLOPRAMIDE 10 MG TABLET: 10 | 8 days supply | Qty: 30 | Fill #0

## 2017-07-24 MED FILL — DULoxetine HCL 20 MG CPEP: 20 | 30 days supply | Qty: 30 | Fill #1

## 2017-07-24 MED FILL — METHOCARBAMOL 750 MG TABLET: 750 | 10 days supply | Qty: 30 | Fill #1

## 2017-07-26 ENCOUNTER — Other Ambulatory Visit: Payer: Self-pay | Admitting: Neurological Surgery

## 2017-07-26 DIAGNOSIS — M4722 Other spondylosis with radiculopathy, cervical region: Secondary | ICD-10-CM

## 2017-07-26 NOTE — Telephone Encounter (Signed)
Patient is requesting a refill on Hydroxyzine. 

## 2017-07-27 MED ORDER — HYDROXYZINE HCL 25 MG PO TABS: 25.0000 mg | ORAL_TABLET | Freq: Four times a day (QID) | ORAL | 0 refills | Status: DC | PRN

## 2017-07-31 ENCOUNTER — Encounter: Payer: Self-pay | Admitting: Internal Medicine

## 2017-07-31 ENCOUNTER — Emergency Department (HOSPITAL_COMMUNITY): Payer: Medicaid Other

## 2017-07-31 ENCOUNTER — Ambulatory Visit: Payer: Medicaid Other | Attending: Internal Medicine | Admitting: Internal Medicine

## 2017-07-31 ENCOUNTER — Emergency Department (HOSPITAL_COMMUNITY)
Admission: EM | Admit: 2017-07-31 | Discharge: 2017-07-31 | Disposition: A | Discharge status: Home / Self Care | Payer: Medicaid Other | Attending: Emergency Medicine | Admitting: Emergency Medicine

## 2017-07-31 VITALS — BP 134/83 | HR 80 | Temp 98.1°F | Resp 16 | Wt 190.2 lb

## 2017-07-31 DIAGNOSIS — F331 Major depressive disorder, recurrent, moderate: Secondary | ICD-10-CM | POA: Insufficient documentation

## 2017-07-31 DIAGNOSIS — R200 Anesthesia of skin: Secondary | ICD-10-CM | POA: Insufficient documentation

## 2017-07-31 DIAGNOSIS — Z7902 Long term (current) use of antithrombotics/antiplatelets: Secondary | ICD-10-CM | POA: Diagnosis not present

## 2017-07-31 DIAGNOSIS — R791 Abnormal coagulation profile: Secondary | ICD-10-CM

## 2017-07-31 DIAGNOSIS — Z91013 Allergy to seafood: Secondary | ICD-10-CM | POA: Insufficient documentation

## 2017-07-31 DIAGNOSIS — R202 Paresthesia of skin: Secondary | ICD-10-CM | POA: Diagnosis not present

## 2017-07-31 DIAGNOSIS — R51 Headache: Secondary | ICD-10-CM | POA: Diagnosis not present

## 2017-07-31 DIAGNOSIS — Z7901 Long term (current) use of anticoagulants: Secondary | ICD-10-CM | POA: Insufficient documentation

## 2017-07-31 DIAGNOSIS — E1122 Type 2 diabetes mellitus with diabetic chronic kidney disease: Secondary | ICD-10-CM | POA: Diagnosis not present

## 2017-07-31 DIAGNOSIS — Z9889 Other specified postprocedural states: Secondary | ICD-10-CM | POA: Insufficient documentation

## 2017-07-31 DIAGNOSIS — N183 Chronic kidney disease, stage 3 (moderate): Secondary | ICD-10-CM | POA: Insufficient documentation

## 2017-07-31 DIAGNOSIS — Z8673 Personal history of transient ischemic attack (TIA), and cerebral infarction without residual deficits: Secondary | ICD-10-CM | POA: Insufficient documentation

## 2017-07-31 DIAGNOSIS — Z79899 Other long term (current) drug therapy: Secondary | ICD-10-CM | POA: Insufficient documentation

## 2017-07-31 DIAGNOSIS — K219 Gastro-esophageal reflux disease without esophagitis: Secondary | ICD-10-CM | POA: Insufficient documentation

## 2017-07-31 DIAGNOSIS — I129 Hypertensive chronic kidney disease with stage 1 through stage 4 chronic kidney disease, or unspecified chronic kidney disease: Secondary | ICD-10-CM | POA: Diagnosis not present

## 2017-07-31 DIAGNOSIS — I252 Old myocardial infarction: Secondary | ICD-10-CM | POA: Diagnosis not present

## 2017-07-31 DIAGNOSIS — I249 Acute ischemic heart disease, unspecified: Secondary | ICD-10-CM | POA: Insufficient documentation

## 2017-07-31 DIAGNOSIS — R519 Headache, unspecified: Secondary | ICD-10-CM

## 2017-07-31 DIAGNOSIS — Z760 Encounter for issue of repeat prescription: Secondary | ICD-10-CM | POA: Insufficient documentation

## 2017-07-31 DIAGNOSIS — R531 Weakness: Secondary | ICD-10-CM | POA: Diagnosis not present

## 2017-07-31 HISTORY — DX: Gastro-esophageal reflux disease without esophagitis: K21.9

## 2017-07-31 LAB — CBC
HCT: 43.3 % (ref 39.0–52.0)
Hemoglobin: 13.8 g/dL (ref 13.0–17.0)
MCH: 26.9 pg (ref 26.0–34.0)
MCHC: 31.9 g/dL (ref 30.0–36.0)
MCV: 84.4 fL (ref 78.0–100.0)
PLATELETS: 219 10*3/uL (ref 150–400)
RBC: 5.13 MIL/uL (ref 4.22–5.81)
RDW: 16.8 % — AB (ref 11.5–15.5)
WBC: 5.8 10*3/uL (ref 4.0–10.5)

## 2017-07-31 LAB — BASIC METABOLIC PANEL
Anion gap: 7 (ref 5–15)
BUN: 11 mg/dL (ref 6–20)
CHLORIDE: 107 mmol/L (ref 101–111)
CO2: 23 mmol/L (ref 22–32)
CREATININE: 1.31 mg/dL — AB (ref 0.61–1.24)
Calcium: 8.8 mg/dL — ABNORMAL LOW (ref 8.9–10.3)
GFR calc Af Amer: >60 mL/min (ref >60)
GFR calc non Af Amer: 57 mL/min — ABNORMAL LOW (ref >60)
Glucose, Bld: 96 mg/dL (ref 65–99)
Potassium: 4.1 mmol/L (ref 3.5–5.1)
SODIUM: 137 mmol/L (ref 135–145)

## 2017-07-31 LAB — PROTIME-INR
INR: 3.35
Prothrombin Time: 33.7 seconds — ABNORMAL HIGH (ref 11.4–15.2)

## 2017-07-31 LAB — POCT INR: INR: 5.4

## 2017-07-31 MED ORDER — MECLIZINE HCL 25 MG PO TABS: 25.0000 mg | ORAL_TABLET | Freq: Three times a day (TID) | ORAL | 1 refills | Status: DC | PRN

## 2017-07-31 MED ORDER — TRAMADOL HCL 50 MG PO TABS
50.0000 mg | ORAL_TABLET | Freq: Once | ORAL | Status: AC
  Administered 2017-07-31: 50 mg via ORAL
  Filled 2017-07-31: qty 1

## 2017-07-31 MED ORDER — OMEPRAZOLE 20 MG PO CPDR: 20.0000 mg | DELAYED_RELEASE_CAPSULE | Freq: Every day | ORAL | 3 refills | Status: DC

## 2017-07-31 MED ORDER — OXYCODONE-ACETAMINOPHEN 5-325 MG PO TABS
1.0000 | ORAL_TABLET | Freq: Once | ORAL | Status: AC
  Administered 2017-07-31: 1 via ORAL
  Filled 2017-07-31: qty 1

## 2017-07-31 MED ORDER — LORAZEPAM 1 MG PO TABS
1.0000 mg | ORAL_TABLET | Freq: Once | ORAL | Status: AC
  Administered 2017-07-31: 1 mg via ORAL
  Filled 2017-07-31: qty 1

## 2017-07-31 MED ORDER — DULOXETINE HCL 20 MG PO CPEP: 20.0000 mg | ORAL_CAPSULE | Freq: Every day | ORAL | 3 refills | Status: DC

## 2017-07-31 NOTE — ED Provider Notes (Signed)
[facility]-EMERGENCY DEPT Provider Note   CSN: [id number] Arrival date & time: 07/31/17  1715     History   Chief Complaint Chief Complaint  Patient presents with  . Headache  . Weakness    HPI Russell Carter is a 61 y.o. male.  Patient with hx htn, dvt, migraines, c/o dull pain to side of head in the past day. Symptoms gradual in onset, mod-severe. Pt unsure if same as prior headaches. No thunderclap type headache. No eye pain or change in vision. No nv. No neck pain or stiffness. No fever or chills.  States left side felt weak. Denies trauma/fall. No chest pain or sob. No trouble swallowing. Went to pcp who referred to ED.    The history is provided by the patient. The history is limited by the condition of the patient.  Headache   Pertinent negatives include no fever, no shortness of breath and no vomiting.  Weakness  Associated symptoms include headaches. Pertinent negatives include no shortness of breath, no chest pain, no vomiting and no confusion.    Past Medical History:  Diagnosis Date  . Anginal pain (HCC)   . Arthritis   . Blood in stool   . CAD (coronary artery disease)    a. 2011: cath showing mod non-obstructive disease b. 02/2016: cath with 95% stenosis in the Mid RCA. DES placed.  . Cervical stenosis of spine   . Cocaine abuse (HCC)    history  . Depression   . Diabetes mellitus without complication (HCC)   . Gout, unspecified   . Headache   . HTN (hypertension)   . Hypercholesterolemia   . Hyperglycemia   . Migraines   . Myocardial infarction [facility]) 2008   drug cocaine and marijuana at time of mi none  since    . Pneumonia   . Renal insufficiency   . Stroke [facility])     Patient Active Problem List   Diagnosis Date Noted  . Numbness and tingling in both hands 07/31/2017  . Gastroesophageal reflux disease without esophagitis 07/31/2017  . Moderate episode of recurrent major depressive disorder (HCC) 07/31/2017  . Major depressive disorder, recurrent  episode, mild (HCC) 06/14/2017  . Chronic pain disorder 06/05/2017  . Supratherapeutic INR 04/27/2017  . Gastrointestinal hemorrhage with melena   . Blood loss anemia 04/25/2017  . Pulmonary embolism (HCC) 02/14/2017  . Fatigue associated with anemia   . Post-operative pain   . Radicular pain   . Diabetes mellitus type 2 in nonobese (HCC)   . Surgery, elective   . Postoperative pain   . Leukocytosis   . Thrombocytopenia (HCC)   . History of myocardial infarction   . Cocaine abuse (HCC)   . Depression   . Type 2 diabetes mellitus with hyperglycemia, without long-term current use of insulin (HCC)   . Stage 3 chronic kidney disease (HCC)   . Benign essential HTN   . History of CVA (cerebrovascular accident)   . Prediabetes   . Cervical spondylosis with radiculopathy 01/24/2017  . Dyslipidemia 03/15/2016  . Unstable angina (HCC) - chest pain with complete heart block & chest pain 03/15/2016  . Complete heart block (HCC) 03/14/2016  . Cervical spinal stenosis 03/14/2016  . Chest pain with moderate risk of acute coronary syndrome 03/13/2016  . CAD-50% LAD 2011 12/14/2014  . Gout 11/16/2014  . Headache 07/15/2013  . Polysubstance abuse (HCC) 01/15/2013  . DIZZINESS 06/05/2010  . Essential hypertension 05/18/2009  . Acute myocardial infarction (HCC) 05/18/2009  . MUSCLE  SPASM 05/18/2009    Past Surgical History:  Procedure Laterality Date  . ANTERIOR CERVICAL DECOMP/DISCECTOMY FUSION N/A 01/24/2017   Procedure: Cervical Four-Five,Cervical Five Six,Cervical Six-Seven Anterior cervical discectomy with fusion and plate with Cervical Three-Thoracic- One Laminectomy/Foraminotomy with Cervical Three-Thoracic- One Fixation and fusion;  Surgeon: Loura Halt Ditty, MD;  Location: [facility] OR;  Service: Neurosurgery;  Laterality: N/A;  Part-1 anterior approach  . CARDIAC CATHETERIZATION  2008; ~ 2011   [place]-40%lad  . CARDIAC CATHETERIZATION N/A 03/16/2016   Procedure: Left Heart Cath and  Coronary Angiography;  Surgeon: Marykay Lex, MD;  Location: [facility] INVASIVE CV LAB;  Service: Cardiovascular;  Laterality: N/A;  . CORONARY ANGIOPLASTY     STENTS  . EYE SURGERY Bilateral    "for double vision"  . INGUINAL HERNIA REPAIR  05/12/2012   Procedure: HERNIA REPAIR INGUINAL ADULT;  Surgeon: Liz Malady, MD;  Location: [facility];  Service: General;  Laterality: Right;  . INGUINAL HERNIA REPAIR Left 02/02/2003   Dr. Violeta Gelinas. repair with mesh. Same procedure done again in  05/22/2004   . INGUINAL HERNIA REPAIR Left ?date 2nd OR  . POSTERIOR CERVICAL FUSION/FORAMINOTOMY N/A 01/24/2017   Procedure: Cervical Three-Thoracic One-Laminectomy/Foraminotomy with Cervical Three-Thoracic One Fixation and fusion;  Surgeon: Loura Halt Ditty, MD;  Location[facility] OR;  Service: Neurosurgery;  Laterality: N/A;  Part-2 Posterior approach  . RECONSTRUCT / STABILIZE DISTAL ULNA    . SHOULDER OPEN ROTATOR CUFF REPAIR Left 2010  . SHOULDER SURGERY Left    "injured on the job; had to cut small bone out"  . THUMB FUSION Right        Home Medications    Prior to Admission medications   Medication Sig Start Date End Date Taking? Authorizing Provider  allopurinol (ZYLOPRIM) 300 MG tablet Take 1 tablet (300 mg total) by mouth daily. 05/02/17   Quentin Angst, MD  CALCIUM-VITAMIN D PO Take 1 tablet by mouth daily.    [provider]  clopidogrel (PLAVIX) 75 MG tablet Take 1 tablet (75 mg total) by mouth daily. 05/02/17   Quentin Angst, MD  DULoxetine (CYMBALTA) 20 MG capsule Take 1 capsule (20 mg total) by mouth daily. 07/31/17   Quentin Angst, MD  ferrous sulfate 325 (65 FE) MG tablet Take 325 mg by mouth 3 (three) times daily with meals.    [provider]  gabapentin (NEURONTIN) 400 MG capsule Take 2 capsules (800 mg total) by mouth 4 (four) times daily. Patient taking differently: Take 400 mg by mouth 2 (two) times daily.  05/02/17   Quentin Angst, MD  hydrOXYzine (ATARAX/VISTARIL) 25 MG tablet Take 1 tablet (25 mg total) by mouth every 6 (six) hours as needed for anxiety. 07/27/17   Quentin Angst, MD  ibuprofen (ADVIL,MOTRIN) 800 MG tablet Take 800 mg by mouth daily.    [provider]  meclizine (ANTIVERT) 25 MG tablet Take 1 tablet (25 mg total) by mouth 3 (three) times daily as needed for dizziness. 07/31/17   Quentin Angst, MD  methocarbamol (ROBAXIN) 750 MG tablet Take 1 tablet (750 mg total) by mouth every 8 (eight) hours as needed for muscle spasms. 05/02/17   Quentin Angst, MD  metoCLOPramide (REGLAN) 10 MG tablet Take 1 tablet (10 mg total) by mouth every 6 (six) hours as needed for nausea (or headache). 07/14/17   Dione Booze, MD  nitroGLYCERIN (NITROSTAT) 0.4 MG SL tablet Place 1 tablet (0.4 mg total) under  the tongue every 5 (five) minutes as needed for chest pain. 05/02/17   Quentin Angst, MD  omeprazole (PRILOSEC) 20 MG capsule Take 1 capsule (20 mg total) by mouth daily. 07/31/17   Quentin Angst, MD  pantoprazole (PROTONIX) 40 MG tablet Take 1 tablet (40 mg total) by mouth at bedtime. 05/02/17   Quentin Angst, MD  rosuvastatin (CRESTOR) 40 MG tablet Take 1 tablet (40 mg total) by mouth at bedtime. 05/02/17 07/31/17  Quentin Angst, MD  warfarin (COUMADIN) 5 MG tablet Take 1.5 tablets Mondays, Wednesdays, and Fridays. Take 1 tablet all other days. 07/24/17   Quentin Angst, MD    Family History Family History  Problem Relation Age of Onset  . Heart attack Mother        x7. Still alive  . Heart attack Father        deceased b/c of MI  . Heart attack Brother        older, deceased b/c of MI   . Cancer Brother        bone cancer in 1 brother, ? brain cancer in other brother    Social History Social History  Substance Use Topics  . Smoking status: Never Smoker  . Smokeless tobacco: Never Used  . Alcohol use Yes     Comment: 16 oz beer every 2-3  days     Allergies   Shellfish allergy   Review of Systems Review of Systems  Constitutional: Negative for fever.  HENT: Negative for trouble swallowing.   Eyes: Negative for redness.  Respiratory: Negative for shortness of breath.   Cardiovascular: Negative for chest pain.  Gastrointestinal: Negative for abdominal pain and vomiting.  Genitourinary: Negative for flank pain.  Musculoskeletal: Negative for back pain and neck pain.  Skin: Negative for rash.  Neurological: Positive for weakness and headaches.  Hematological: Does not bruise/bleed easily.  Psychiatric/Behavioral: Negative for confusion.     Physical Exam Updated Vital Signs BP 121/81   Pulse 70   Temp 98 F (36.7 C) (Oral)   Resp 13   Ht 1.803 m (5\' 11" )   Wt 86.2 kg (190 lb)   SpO2 97%   BMI 26.50 kg/m   Physical Exam  Constitutional: He appears well-developed and well-nourished. No distress.  HENT:  Right Ear: External ear normal.  Left Ear: External ear normal.  Mouth/Throat: Oropharynx is clear and moist.  No sinus or temporal tenderness.   Eyes: Pupils are equal, round, and reactive to light. Conjunctivae and EOM are normal.  Neck: Normal range of motion. Neck supple. No tracheal deviation present. No thyromegaly present.  No stiffness or rigidity  Cardiovascular: Normal rate, regular rhythm, normal heart sounds and intact distal pulses.  Exam reveals no gallop and no friction rub.   No murmur heard. Pulmonary/Chest: Effort normal and breath sounds normal. No accessory muscle usage. No respiratory distress.  Abdominal: Soft. He exhibits no distension. There is no tenderness.  Musculoskeletal: He exhibits no edema.  CTLS spine, non tender, aligned, no step off.   Neurological: He is alert.  Speech clear/fluent. Pt with very odd neuro exam.  With grip and strength testing he appears to give no effort, however when trying to move his extremities, he resists any movement with very good/normal  strength.  No facial droop is noted, no pronator drift.   Skin: Skin is warm and dry. No rash noted. He is not diaphoretic.  Psychiatric: He has a normal mood and affect.  Nursing note and vitals reviewed.    ED Treatments / Results  Labs (all labs ordered are listed, but only abnormal results are displayed) Results for orders placed or performed during the hospital encounter of 07/31/17  Basic metabolic panel  Result Value Ref Range   Sodium 137 135 - 145 mmol/L   Potassium 4.1 3.5 - 5.1 mmol/L   Chloride 107 101 - 111 mmol/L   CO2 23 22 - 32 mmol/L   Glucose, Bld 96 65 - 99 mg/dL   BUN 11 6 - 20 mg/dL   Creatinine, Ser 1.61 (H) 0.61 - 1.24 mg/dL   Calcium 8.8 (L) 8.9 - 10.3 mg/dL   GFR calc non Af Amer 57 (L) >60 mL/min   GFR calc Af Amer >60 >60 mL/min   Anion gap 7 5 - 15  CBC  Result Value Ref Range   WBC 5.8 4.0 - 10.5 K/uL   RBC 5.13 4.22 - 5.81 MIL/uL   Hemoglobin 13.8 13.0 - 17.0 g/dL   HCT 09.6 04.5 - 40.9 %   MCV 84.4 78.0 - 100.0 fL   MCH 26.9 26.0 - 34.0 pg   MCHC 31.9 30.0 - 36.0 g/dL   RDW 81.1 (H) 91.4 - 78.2 %   Platelets 219 150 - 400 K/uL  Protime-INR  Result Value Ref Range   Prothrombin Time 33.7 (H) 11.4 - 15.2 seconds   INR 3.35    Ct Head Wo Contrast  Result Date: 07/31/2017 CLINICAL DATA:  Altered level of consciousness, headache, left-sided weakness EXAM: CT HEAD WITHOUT CONTRAST TECHNIQUE: Contiguous axial images were obtained from the base of the skull through the vertex without intravenous contrast. COMPARISON:  07/13/2017 FINDINGS: Brain: No acute intracranial abnormality. Specifically, no hemorrhage, hydrocephalus, mass lesion, acute infarction, or significant intracranial injury. Vascular: No hyperdense vessel or unexpected calcification. Skull: No acute calvarial abnormality. Sinuses/Orbits: Visualized paranasal sinuses and mastoids clear. Orbital soft tissues unremarkable. Other: None IMPRESSION: No acute intracranial abnormality.  Electronically Signed   By: Charlett Nose M.D.   On: 07/31/2017 18:21   Ct Head Wo Contrast  Result Date: 07/13/2017 CLINICAL DATA:  Headache for 2 weeks EXAM: CT HEAD WITHOUT CONTRAST TECHNIQUE: Contiguous axial images were obtained from the base of the skull through the vertex without intravenous contrast. COMPARISON:  MRI 05/15/2017, CT brain 05/15/2017 FINDINGS: Brain: No acute territorial infarction, hemorrhage or intracranial mass is seen. Minimal small vessel ischemic changes of the white matter. Stable ventricle size. Vascular: No hyperdense vessels. Minimal calcification at the carotid siphon. Skull: Normal. Negative for fracture or focal lesion. Sinuses/Orbits: Mucosal thickening in the maxillary and ethmoid sinuses. No acute orbital abnormality. Other: None IMPRESSION: No CT evidence for acute intracranial abnormality Electronically Signed   By: Jasmine Pang M.D.   On: 07/13/2017 23:47    EKG  EKG Interpretation  Date/Time:  Wednesday July 31 2017 17:15:16 EDT Ventricular Rate:  73 PR Interval:    QRS Duration: 89 QT Interval:  385 QTC Calculation: 425 R Axis:   37 Text Interpretation:  Sinus rhythm Abnormal R-wave progression, early transition Confirmed by Cathren Laine ([id number]) on 07/31/2017 5:19:09 PM       Radiology Ct Head Wo Contrast  Result Date: 07/31/2017 CLINICAL DATA:  Altered level of consciousness, headache, left-sided weakness EXAM: CT HEAD WITHOUT CONTRAST TECHNIQUE: Contiguous axial images were obtained from the base of the skull through the vertex without intravenous contrast. COMPARISON:  07/13/2017 FINDINGS: Brain: No acute intracranial abnormality. Specifically, no hemorrhage, hydrocephalus, mass  lesion, acute infarction, or significant intracranial injury. Vascular: No hyperdense vessel or unexpected calcification. Skull: No acute calvarial abnormality. Sinuses/Orbits: Visualized paranasal sinuses and mastoids clear. Orbital soft tissues unremarkable.  Other: None IMPRESSION: No acute intracranial abnormality. Electronically Signed   By: Charlett Nose M.D.   On: 07/31/2017 18:21    Procedures Procedures (including critical care time)  Medications Ordered in ED Medications  LORazepam (ATIVAN) tablet 1 mg (not administered)  traMADol (ULTRAM) tablet 50 mg (not administered)     Initial Impression / Assessment and Plan / ED Course  I have reviewed the triage vital signs and the nursing notes.  Pertinent labs & imaging results that were available during my care of the patient were reviewed by me and considered in my medical decision making (see chart for details).  Iv ns. Labs.  Ct.  Reviewed nursing notes and prior charts for additional history.  On review notes, it appears pt has had several evals in including mri/mra, ct/cta for evaluation of similar symptoms - all of which have been negative in past.     Ativan po. Ultram po.   Recheck, pt comfortable, no distress. Ct neg.    Ambulates in hall.   Pt currently appears stable for d/c.     Final Clinical Impressions(s) / ED Diagnoses   Final diagnoses:  None    New Prescriptions New Prescriptions   No medications on file     Cathren Laine, MD 07/31/17 2022

## 2017-07-31 NOTE — ED Notes (Signed)
Pt ambulated to the bathroom and back with steady gait, patient ate all of [place] sandwich and water

## 2017-07-31 NOTE — Progress Notes (Signed)
Subjective:  Patient ID: Russell Carter, male    DOB: 1956-03-08  Age: 61 y.o. MRN: [id number]  CC: Hospitalization Follow-up   HPI Russell Carter is a 61 y/o [place] American male, who presents in clinic with c/o headache with numbness/tingling in bilateral hands. Patient has PMHx of HTN, CAD, CVA, MI in 2008,Type 2 DM and cervical stenosis.   Headache: Reports headache x 1 day, this morning. He describes the pain as a sharp throbbing pain, radiating from the front to back of his head. Reports dizziness, feeling imbalanced, photophobia, and bilateral paresthesia in hands with resting tremors. Denies n/v, visual disturbance, or falls. Reports he can not function due to the headache, "all I want to do is sleep". Patient reports cervical neck pain radiating to his left shoulder, onset las night. Denies recent injury. Reports the neck and shoulder pain was a sudden increase from baseline chronic pain that lead to his headache today. Patient has been taking Reglan that he was prescribed in the ER on 07/13/17 for his headache.   INR check: Patient INR was checked after medication modification. Today's poc-INR was 5.4. Patient reports taking medication as prescribed. Denies hematochezia, hematuria, or oral bleeding.   Past Medical History:  Diagnosis Date  . Anginal pain (HCC)   . Arthritis   . Blood in stool   . CAD (coronary artery disease)    a. 2011: cath showing mod non-obstructive disease b. 02/2016: cath with 95% stenosis in the Mid RCA. DES placed.  . Cervical stenosis of spine   . Cocaine abuse (HCC)    history  . Depression   . Diabetes mellitus without complication (HCC)   . Gout, unspecified   . Headache   . HTN (hypertension)   . Hypercholesterolemia   . Hyperglycemia   . Migraines   . Myocardial infarction [facility]) 2008   drug cocaine and marijuana at time of mi none  since    . Pneumonia   . Renal insufficiency   . Stroke [facility])    Past Surgical History:  Procedure  Laterality Date  . ANTERIOR CERVICAL DECOMP/DISCECTOMY FUSION N/A 01/24/2017   Procedure: Cervical Four-Five,Cervical Five Six,Cervical Six-Seven Anterior cervical discectomy with fusion and plate with Cervical Three-Thoracic- One Laminectomy/Foraminotomy with Cervical Three-Thoracic- One Fixation and fusion;  Surgeon: Loura Halt Ditty, MD;  Location: [facility] OR;  Service: Neurosurgery;  Laterality: N/A;  Part-1 anterior approach  . CARDIAC CATHETERIZATION  2008; ~ 2011   [place]-40%lad  . CARDIAC CATHETERIZATION N/A 03/16/2016   Procedure: Left Heart Cath and Coronary Angiography;  Surgeon: Marykay Lex, MD;  Location: [facility] INVASIVE CV LAB;  Service: Cardiovascular;  Laterality: N/A;  . CORONARY ANGIOPLASTY     STENTS  . EYE SURGERY Bilateral    "for double vision"  . INGUINAL HERNIA REPAIR  05/12/2012   Procedure: HERNIA REPAIR INGUINAL ADULT;  Surgeon: Liz Malady, MD;  Location: [facility];  Service: General;  Laterality: Right;  . INGUINAL HERNIA REPAIR Left 02/02/2003   Dr. Violeta Gelinas. repair with mesh. Same procedure done again in  05/22/2004   . INGUINAL HERNIA REPAIR Left ?date 2nd OR  . POSTERIOR CERVICAL FUSION/FORAMINOTOMY N/A 01/24/2017   Procedure: Cervical Three-Thoracic One-Laminectomy/Foraminotomy with Cervical Three-Thoracic One Fixation and fusion;  Surgeon: Loura Halt Ditty, MD;  Location: [facility] OR;  Service: Neurosurgery;  Laterality: N/A;  Part-2 Posterior approach  . RECONSTRUCT / STABILIZE DISTAL ULNA    . SHOULDER OPEN ROTATOR CUFF REPAIR Left 2010  . SHOULDER  SURGERY Left    "injured on the job; had to cut small bone out"  . THUMB FUSION Right    Outpatient Medications Prior to Visit  Medication Sig Dispense Refill  . allopurinol (ZYLOPRIM) 300 MG tablet Take 1 tablet (300 mg total) by mouth daily. 90 tablet 6  . CALCIUM-VITAMIN D PO Take 1 tablet by mouth daily.    . clopidogrel (PLAVIX) 75 MG tablet Take 1 tablet (75 mg total) by mouth daily. 30  tablet 0  . ferrous sulfate 325 (65 FE) MG tablet Take 325 mg by mouth 3 (three) times daily with meals.    . gabapentin (NEURONTIN) 400 MG capsule Take 2 capsules (800 mg total) by mouth 4 (four) times daily. (Patient taking differently: Take 400 mg by mouth 2 (two) times daily. ) 240 capsule 3  . hydrOXYzine (ATARAX/VISTARIL) 25 MG tablet Take 1 tablet (25 mg total) by mouth every 6 (six) hours as needed for anxiety. 30 tablet 0  . ibuprofen (ADVIL,MOTRIN) 800 MG tablet Take 800 mg by mouth daily.    . methocarbamol (ROBAXIN) 750 MG tablet Take 1 tablet (750 mg total) by mouth every 8 (eight) hours as needed for muscle spasms. 90 tablet 1  . metoCLOPramide (REGLAN) 10 MG tablet Take 1 tablet (10 mg total) by mouth every 6 (six) hours as needed for nausea (or headache). 30 tablet 0  . nitroGLYCERIN (NITROSTAT) 0.4 MG SL tablet Place 1 tablet (0.4 mg total) under the tongue every 5 (five) minutes as needed for chest pain. 25 tablet 4  . pantoprazole (PROTONIX) 40 MG tablet Take 1 tablet (40 mg total) by mouth at bedtime. 30 tablet 1  . rosuvastatin (CRESTOR) 40 MG tablet Take 1 tablet (40 mg total) by mouth at bedtime. 30 tablet 3  . warfarin (COUMADIN) 5 MG tablet Take 1.5 tablets Mondays, Wednesdays, and Fridays. Take 1 tablet all other days. 45 tablet 2  . DULoxetine (CYMBALTA) 20 MG capsule Take 1 capsule (20 mg total) by mouth daily. 60 capsule 0  . meclizine (ANTIVERT) 25 MG tablet Take 1 tablet (25 mg total) by mouth 3 (three) times daily as needed for dizziness. 30 tablet 1  . omeprazole (PRILOSEC) 20 MG capsule Take 1 capsule (20 mg total) by mouth daily. 30 capsule 0   No facility-administered medications prior to visit.    Allergies  Allergen Reactions  . Shellfish Allergy Anaphylaxis   ROS Review of Systems  Constitutional: Positive for activity change and fatigue. Negative for appetite change.  Eyes: Positive for photophobia and pain. Negative for visual disturbance.    Respiratory: Negative for chest tightness and shortness of breath.   Cardiovascular: Negative for chest pain and palpitations.  Gastrointestinal: Negative for abdominal pain, blood in stool, nausea and vomiting.  Genitourinary: Negative for hematuria.  Musculoskeletal: Positive for gait problem and neck pain.  Neurological: Positive for dizziness, tremors, weakness, light-headedness, numbness and headaches. Negative for syncope, facial asymmetry and speech difficulty.  Psychiatric/Behavioral: Negative for behavioral problems, confusion and decreased concentration.  All other systems reviewed and are negative.  Objective:  BP 134/83   Pulse 80   Temp 98.1 F (36.7 C) (Oral)   Resp 16   Wt 190 lb 3.2 oz (86.3 kg)   SpO2 95%   BMI 26.53 kg/m   BP/Weight 07/31/2017 07/14/2017 07/13/2017  Systolic BP 134 153 -  Diastolic BP 83 97 -  Wt. (Lbs) 190.2 - 173  BMI 26.53 - 24.13   Lab Results  Component Value Date   INR 5.4 07/31/2017   INR 1.4 07/24/2017   INR 1.8 07/17/2017   Physical Exam  Constitutional: He is oriented to person, place, and time. He appears well-developed and well-nourished. He appears distressed.  HENT:  Head: Normocephalic and atraumatic.  Eyes: Pupils are equal, round, and reactive to light. Conjunctivae and EOM are normal.  Neck: Trachea normal. Spinous process tenderness present. No neck rigidity. Decreased range of motion present.  Cardiovascular: Normal rate and regular rhythm.   Pulmonary/Chest: Effort normal and breath sounds normal. No respiratory distress.  Abdominal: Soft. Bowel sounds are normal. He exhibits no distension. There is no tenderness.  Musculoskeletal: He exhibits tenderness (L shoulder). He exhibits no edema or deformity.  Neurological: He is alert and oriented to person, place, and time. He displays tremor (mild resting tremor in hands, bilaterlly). No cranial nerve deficit or sensory deficit.  Skin: Skin is warm and dry.  Psychiatric:  He has a normal mood and affect. His behavior is normal. Judgment and thought content normal.   Assessment & Plan:   1. Acute intractable headache, unspecified headache type - Concerned for new onset of HA with supratherapeutic poc-INR 5.4. -  Referred to Emergency Department to r/o Cerebral Hemorrhage.  - Patient will f/u in 1 week.  - Patient requested refill on Meclizine. - meclizine (ANTIVERT) 25 MG tablet; Take 1 tablet (25 mg total) by mouth 3 (three) times daily as needed for dizziness.  Dispense: 30 tablet; Refill: 1  2. Supratherapeutic INR - INR - poc 5.4.  - Will modify medication regimen.    3. Numbness and tingling in both hands - Acute symptoms increased with new onset HA and elevated INR. Patient referred to Emergency Department for further evaluation.   4. Gastroesophageal reflux disease without esophagitis, Controlled - Continue medication regimen. Patient requested refill.  - omeprazole (PRILOSEC) 20 MG capsule; Take 1 capsule (20 mg total) by mouth daily.  Dispense: 30 capsule; Refill: 3  5. Moderate episode of recurrent major depressive disorder (HCC). Controlled - Continue medication refill.  - DULoxetine (CYMBALTA) 20 MG capsule; Take 1 capsule (20 mg total) by mouth daily.  Dispense: 60 capsule; Refill: 3  Meds ordered this encounter  Medications  . omeprazole (PRILOSEC) 20 MG capsule    Sig: Take 1 capsule (20 mg total) by mouth daily.    Dispense:  30 capsule    Refill:  3  . meclizine (ANTIVERT) 25 MG tablet    Sig: Take 1 tablet (25 mg total) by mouth 3 (three) times daily as needed for dizziness.    Dispense:  30 tablet    Refill:  1  . DULoxetine (CYMBALTA) 20 MG capsule    Sig: Take 1 capsule (20 mg total) by mouth daily.    Dispense:  60 capsule    Refill:  3    Follow-up: Return in about 1 week (around 08/07/2017) for INR Check, Follow up Headache.   Krystle H. Dove   Evaluation and management procedures were performed by me with DNP  Student in attendance, note written by DNP student under my supervision and collaboration. I have reviewed the note and I agree with the management and plan.   Jeanann Lewandowsky, MD, MHA, CPE, FACP, FAAP [facility] and [facility] [place], [place] [phone]   08/01/2017, 11:41 AM

## 2017-07-31 NOTE — Discharge Instructions (Addendum)
It was our pleasure to provide your ER care today - we hope that you feel better.  Take acetaminophen as need.  Rest, drink plenty of fluids.   From today's lab tests, your kidney function (creatinine 1.33) is mildly elevated compared to prior - avoid taking anti-inflammatory type medication such as ibuprofen, or naprosyn.   Follow up with primary care doctor in the next 1-2 days for recheck. At follow up, also have your doctor arrange to recheck your kidney function test.   Also follow up with neurology as outpatient - see referral - call to arrange appointment.  Return to ER if worse, new symptoms, fevers, change in speech or vision, new one-sided numbness/weakness, other concern.   You were given pain medication in the ER - no driving for the next 6 hours.

## 2017-07-31 NOTE — Patient Instructions (Signed)

## 2017-07-31 NOTE — ED Triage Notes (Signed)
Pt BIB [facility] from PCP. Pt had back sx in April and has been dealing with pain and weakness in left arm. Today weakness happened to left side. According to PCP record pts INR 5.4 and he has had a headache. Pt A+Ox4

## 2017-08-07 ENCOUNTER — Encounter: Payer: Self-pay | Admitting: Neurology

## 2017-08-07 ENCOUNTER — Ambulatory Visit (INDEPENDENT_AMBULATORY_CARE_PROVIDER_SITE_OTHER): Payer: Medicaid Other | Admitting: Neurology

## 2017-08-07 VITALS — BP 127/86 | HR 83 | Resp 18 | Ht 71.0 in | Wt 190.0 lb

## 2017-08-07 DIAGNOSIS — R29898 Other symptoms and signs involving the musculoskeletal system: Secondary | ICD-10-CM | POA: Diagnosis not present

## 2017-08-07 DIAGNOSIS — R51 Headache: Secondary | ICD-10-CM | POA: Diagnosis not present

## 2017-08-07 DIAGNOSIS — G4486 Cervicogenic headache: Secondary | ICD-10-CM

## 2017-08-07 DIAGNOSIS — G894 Chronic pain syndrome: Secondary | ICD-10-CM | POA: Diagnosis not present

## 2017-08-07 MED ORDER — DULOXETINE HCL 30 MG PO CPEP: 30.0000 mg | ORAL_CAPSULE | Freq: Two times a day (BID) | ORAL | 3 refills | Status: DC

## 2017-08-07 NOTE — Progress Notes (Signed)
Reason for visit: Headache, left-sided numbness  Referring physician: [place]  Russell Carter is a 61 y.o. male  History of present illness:  Russell Carter is a [age] year old right-handed black male with a history of prior marijuana and cocaine abuse, diabetes, and coronary artery disease. The patient apparently had significant neck pain and bilateral shoulder and arm discomfort prior to cervical spine surgery that was done on 01/24/2017. The patient claims that immediately after surgery he noted numbness and weakness of the left arm, he has had increased and ongoing neck and shoulder discomfort, he has now developed pain down the spine into the low back and pain and numbness that will come and go on the left leg. The patient has developed left facial numbness over the last 2 weeks. He has had headaches that are all over the head, most severe posteriorly. The patient has neck stiffness. The headaches are associated with nausea without vomiting, he may have photophobia without phonophobia. The headaches have been daily over the last 2 weeks. He has some blurring of vision, dizziness, he claims that he may have blacked out and fallen on 07/13/2017 necessitating an emergency room visit. This was not mentioned in the emergency room note. The patient has been to the emergency room on multiple occasions over the last several months for various reasons. He was evaluated on 05/15/2017 for left-sided numbness. MRI of the brain, MRA of the head and neck, and MRI of the cervical spine was done. These studies did not show an etiology for his symptoms. The patient has also had a CT scan of the brain done on 07/31/2017 when he went in to the ER for headache. The CT was unremarkable. The patient does indicate that he is falling a lot. He denies any problems controlling the bowels or the bladder. He denies any issues with changes per se with loss of vision or any problems with speech or swallowing. He is sent to this office  for further evaluation.  Past Medical History:  Diagnosis Date  . Anginal pain (HCC)   . Arthritis   . Blood in stool   . CAD (coronary artery disease)    a. 2011: cath showing mod non-obstructive disease b. 02/2016: cath with 95% stenosis in the Mid RCA. DES placed.  . Cervical stenosis of spine   . Cocaine abuse (HCC)    history  . Depression   . Diabetes mellitus without complication (HCC)   . Gout, unspecified   . Headache   . HTN (hypertension)   . Hypercholesterolemia   . Hyperglycemia   . Migraines   . Myocardial infarction [facility](HCC) 2008   drug cocaine and marijuana at time of mi none  since    . Pneumonia   . Renal insufficiency   . Stroke [facility](HCC)     Past Surgical History:  Procedure Laterality Date  . ANTERIOR CERVICAL DECOMP/DISCECTOMY FUSION N/A 01/24/2017   Procedure: Cervical Four-Five,Cervical Five Six,Cervical Six-Seven Anterior cervical discectomy with fusion and plate with Cervical Three-Thoracic- One Laminectomy/Foraminotomy with Cervical Three-Thoracic- One Fixation and fusion;  Surgeon: Loura HaltBenjamin Jared Ditty, MD;  [facility];  Service: Neurosurgery;  Laterality: N/A;  Part-1 anterior approach  . CARDIAC CATHETERIZATION  2008; ~ 2011   [place]-40%lad  . CARDIAC CATHETERIZATION N/A 03/16/2016   Procedure: Left Heart Cath and Coronary Angiography;  Surgeon: Marykay Lexavid W Harding, MD;  [facility];  Service: Cardiovascular;  Laterality: N/A;  . CORONARY ANGIOPLASTY     STENTS  .  EYE SURGERY Bilateral    "for double vision"  . INGUINAL HERNIA REPAIR  05/12/2012   Procedure: HERNIA REPAIR INGUINAL ADULT;  Surgeon: Liz Malady, MD;  Location: [facility];  Service: General;  Laterality: Right;  . INGUINAL HERNIA REPAIR Left 02/02/2003   Dr. Violeta Gelinas. repair with mesh. Same procedure done again in  05/22/2004   . INGUINAL HERNIA REPAIR Left ?date 2nd OR  . POSTERIOR CERVICAL FUSION/FORAMINOTOMY N/A 01/24/2017   Procedure: Cervical  Three-Thoracic One-Laminectomy/Foraminotomy with Cervical Three-Thoracic One Fixation and fusion;  Surgeon: Loura Halt Ditty, MD;  Location: [facility] OR;  Service: Neurosurgery;  Laterality: N/A;  Part-2 Posterior approach  . RECONSTRUCT / STABILIZE DISTAL ULNA    . SHOULDER OPEN ROTATOR CUFF REPAIR Left 2010  . SHOULDER SURGERY Left    "injured on the job; had to cut small bone out"  . THUMB FUSION Right     Family History  Problem Relation Age of Onset  . Heart attack Mother        x7. Still alive  . Heart attack Father        deceased b/c of MI  . Heart attack Brother        older, deceased b/c of MI   . Cancer Brother        bone cancer in 1 brother, ? brain cancer in other brother    Social history:  reports that he has never smoked. He has never used smokeless tobacco. He reports that he drinks alcohol. He reports that he uses drugs, including "Crack" cocaine, Marijuana, and Cocaine.  Medications:  Prior to Admission medications   Medication Sig Start Date End Date Taking? Authorizing Provider  allopurinol (ZYLOPRIM) 300 MG tablet Take 1 tablet (300 mg total) by mouth daily. 05/02/17  Yes Quentin Angst, MD  CALCIUM-VITAMIN D PO Take 1 tablet by mouth daily.   Yes [provider]  DULoxetine (CYMBALTA) 20 MG capsule Take 1 capsule (20 mg total) by mouth daily. 07/31/17  Yes Quentin Angst, MD  ferrous sulfate 325 (65 FE) MG tablet Take 325 mg by mouth 3 (three) times daily with meals.   Yes [provider]  gabapentin (NEURONTIN) 400 MG capsule Take 2 capsules (800 mg total) by mouth 4 (four) times daily. Patient taking differently: Take 400 mg by mouth 2 (two) times daily.  05/02/17  Yes Quentin Angst, MD  hydrOXYzine (ATARAX/VISTARIL) 25 MG tablet Take 1 tablet (25 mg total) by mouth every 6 (six) hours as needed for anxiety. 07/27/17  Yes Quentin Angst, MD  ibuprofen (ADVIL,MOTRIN) 800 MG tablet Take 800 mg by mouth daily.   Yes  [provider]  meclizine (ANTIVERT) 25 MG tablet Take 1 tablet (25 mg total) by mouth 3 (three) times daily as needed for dizziness. 07/31/17  Yes Quentin Angst, MD  methocarbamol (ROBAXIN) 750 MG tablet Take 1 tablet (750 mg total) by mouth every 8 (eight) hours as needed for muscle spasms. 05/02/17  Yes Quentin Angst, MD  metoCLOPramide (REGLAN) 10 MG tablet Take 1 tablet (10 mg total) by mouth every 6 (six) hours as needed for nausea (or headache). 07/14/17  Yes Dione Booze, MD  nitroGLYCERIN (NITROSTAT) 0.4 MG SL tablet Place 1 tablet (0.4 mg total) under the tongue every 5 (five) minutes as needed for chest pain. 05/02/17  Yes Quentin Angst, MD  omeprazole (PRILOSEC) 20 MG capsule Take 1 capsule (20 mg total) by mouth daily. 07/31/17  Yes Quentin Angst, MD  pantoprazole (PROTONIX) 40 MG tablet Take 1 tablet (40 mg total) by mouth at bedtime. 05/02/17  Yes Quentin Angst, MD  warfarin (COUMADIN) 5 MG tablet Take 1.5 tablets Mondays, Wednesdays, and Fridays. Take 1 tablet all other days. 07/24/17  Yes Quentin Angst, MD  clopidogrel (PLAVIX) 75 MG tablet Take 1 tablet (75 mg total) by mouth daily. Patient not taking: Reported on 08/07/2017 05/02/17   Quentin Angst, MD  rosuvastatin (CRESTOR) 40 MG tablet Take 1 tablet (40 mg total) by mouth at bedtime. 05/02/17 07/31/17  Quentin Angst, MD      Allergies  Allergen Reactions  . Shellfish Allergy Anaphylaxis    ROS:  Out of a complete 14 system review of symptoms, the patient complains only of the following symptoms, and all other reviewed systems are negative.  Headache Numbness, weakness  Blood pressure 127/86, pulse 83, resp. rate 18, height 5\' 11"  (1.803 m), weight 190 lb (86.2 kg).  Physical Exam  General: The patient is alert and cooperative at the time of the examination.  Eyes: Pupils are equal, round, and reactive to light. Discs are flat bilaterally.  Neck: The  neck is supple, no carotid bruits are noted.  Respiratory: The respiratory examination is clear.  Cardiovascular: The cardiovascular examination reveals a regular rate and rhythm, no obvious murmurs or rubs are noted.  Neuromuscular: Patient has very minimal rotational movement of the head and neck when looking to the left, he is able to turn the head to the right about 30 or 40. With passive elevation of the left arm, the patient has pain with internal and external rotation of the arm with pain at the shoulder joint.  Skin: Extremities are without significant edema.  Neurologic Exam  Mental status: The patient is alert and oriented x 3 at the time of the examination. The patient has apparent normal recent and remote memory, with an apparently normal attention span and concentration ability.  Cranial nerves: Facial symmetry is present. There is good sensation of the face to pinprick and soft touch on the right, decreased pinprick sensation on the left side, the patient splits the midline with vibration sensation on the forehead, decreased on the left. The strength of the facial muscles and the muscles to head turning and shoulder shrug are normal bilaterally. Speech is well enunciated, no aphasia or dysarthria is noted. Extraocular movements are full with the right eye, the patient has incomplete adduction of the left eye when looking to the right, on primary gaze there is exotropia of the left eye. Visual fields are full. The tongue is midline, and the patient has symmetric elevation of the soft palate. No obvious hearing deficits are noted.  Motor: The motor testing reveals 5 over 5 strength of the right extremities. The patient appears to have good strength with the intrinsic muscles of the left hand, he has minimal ability to flex at the elbow, he has good triceps strength, he cannot elevate the left arm. Good symmetric motor tone is noted throughout.  Sensory: Sensory testing is intact to  pinprick, soft touch, vibration sensation, and position sense on the right extremities, with exception that he has anesthesia to pinprick sensation on both arms. The patient has reported complete loss of vibration and position sense with the left arm and left leg, present on the right. No evidence of extinction is noted.  Coordination: Cerebellar testing reveals good finger-nose-finger and heel-to-shin on the right. The patient is  not able to perform finger-nose-finger well on the left arm, he can perform heel to shin with the left leg.  Gait and station: Gait is associated with a wide-based gait, he walks with the left leg stiff and extended, sliding the leg, normal gait with the right. When the patient performs tandem gait, he flexes the left leg normally. Tandem gait is relatively unremarkable. Romberg is negative. No drift is seen.  Reflexes: Deep tendon reflexes are symmetric and normal bilaterally. Toes are downgoing bilaterally.   CT head 07/31/17:  IMPRESSION: No acute intracranial abnormality.  * CT scan images were reviewed online. I agree with the written report.   MRI brain, MRI cervical, MRA head and neck 05/15/17:   IMPRESSION: MRI HEAD IMPRESSION:  Normal brain MRI for patient age. No acute intracranial process identified.  MRA HEAD IMPRESSION:  Normal intracranial MRA.  MRA NECK IMPRESSION:  Normal MRA of the neck.  MRI CERVICAL SPINE IMPRESSION:  1. No acute abnormality within the cervical spine. 2. Postoperative changes from prior ACDF at C4 through C7 with posterior decompression and fusion at C3 through T1. No residual canal stenosis. 3. Previously seen large postoperative collection has largely resolved, with a small residual collection at the laminectomy site as above. 4. Stable mild disc bulge at C3-4 with resultant mild spinal stenosis. Mild to moderate multilevel foraminal narrowing as above, Stable.  * MRI scan images were reviewed  online. I agree with the written report.    Assessment/Plan:  1. Cervicogenic headache  2. Left arm weakness and numbness  3. Left hemisensory deficit, nonorganic clinical examination  The patient clearly has nonorganic features to the clinical examination, he appears to be manipulating the examination. However, the examination does support possible true weakness with elevation of the left arm. We will set the patient up for EMG and nerve conduction study to evaluate for a C5 and C6 radiculopathy or possibly consider a left rotator cuff tear or intrinsic left shoulder disease. The patient has had a rotator cuff tear repair on the left shoulder in the distant past. The Cymbalta will be increased taking 30 mg daily for 2 weeks and then go to 30 mg twice daily. He will follow-up for the EMG evaluation and follow-up in 4 months for a revisit. The patient claims that he is on hydrocodone for his pain, but this has not been helpful.  Marlan Palau MD 08/07/2017 7:25 AM  [facility] [address] [place], [place] [address]  Phone [phone] Fax [phone]

## 2017-08-07 NOTE — Patient Instructions (Signed)
   We will check EMG and NCV study to evaluate the nerve function of the arms.  We will go up on the cymbalta to 30 mg twice a day.  Cymbalta (duloxetine) is an antidepressant medication that is commonly used for peripheral neuropathy pain or for fibromyalgia pain. As with any antidepressant medication, worsening depression can be seen. This medication can potentially cause headache, dizziness, sexual dysfunction, or nausea. If any problems are noted on this medication, please contact our office.

## 2017-08-16 ENCOUNTER — Telehealth: Payer: Self-pay | Admitting: Internal Medicine

## 2017-08-16 ENCOUNTER — Ambulatory Visit
Admission: RE | Admit: 2017-08-16 | Discharge: 2017-08-16 | Disposition: A | Discharge status: Home / Self Care | Payer: Medicaid Other | Source: Ambulatory Visit | Attending: Neurological Surgery | Admitting: Neurological Surgery

## 2017-08-16 DIAGNOSIS — M4722 Other spondylosis with radiculopathy, cervical region: Secondary | ICD-10-CM

## 2017-08-16 NOTE — Telephone Encounter (Signed)
Call placed to patient # [phone] to inquire about his SCAT application. Patient informed me that he was evaluated and was approved for services.

## 2017-08-21 ENCOUNTER — Ambulatory Visit (INDEPENDENT_AMBULATORY_CARE_PROVIDER_SITE_OTHER): Payer: Medicaid Other | Admitting: Neurology

## 2017-08-21 ENCOUNTER — Encounter: Payer: Self-pay | Admitting: Neurology

## 2017-08-21 ENCOUNTER — Ambulatory Visit (INDEPENDENT_AMBULATORY_CARE_PROVIDER_SITE_OTHER): Payer: Self-pay | Admitting: Neurology

## 2017-08-21 DIAGNOSIS — R29898 Other symptoms and signs involving the musculoskeletal system: Secondary | ICD-10-CM

## 2017-08-21 DIAGNOSIS — G894 Chronic pain syndrome: Secondary | ICD-10-CM

## 2017-08-21 NOTE — Progress Notes (Signed)
Please refer to EMG and nerve conduction study procedure note. 

## 2017-08-21 NOTE — Procedures (Signed)
     HISTORY:  Russell Carter is a 61 year old gentleman with a history of cervical spine surgery done in April 2018, the patient has residual severe pain and weakness involving the left arm, he has developed a "dangling arm" on the side.  The patient is being evaluated for the left arm weakness.  NERVE CONDUCTION STUDIES:  Nerve conduction studies were performed on both upper extremities.  The distal motor latencies for the median nerves were prolonged on the right, normal on the left, with normal motor amplitudes for these nerves bilaterally.  The distal motor latencies and motor amplitudes for the ulnar nerves were normal bilaterally.  The nerve conduction velocities for the median and ulnar nerves were normal bilaterally and the sensory latencies for the median nerves were prolonged on the right, normal on the left, and normal for the ulnar nerves bilaterally.  The F-wave latencies for the median and ulnar nerves were slightly prolonged bilaterally.  Nerve conduction studies were performed on the left lower extremity.  The distal motor latencies for the left peroneal nerve and posterior tibial nerves were prolonged with a low motor amplitude for the left peroneal nerve, normal for the left posterior tibial nerve.  Nerve conduction velocities for the left peroneal and posterior tibial nerves were normal.  The sensory latency for the left peroneal nerve was unobtainable.  EMG STUDIES:  EMG study was performed on the left upper extremity:  The first dorsal interosseous muscle reveals 2 to 4 K units with slightly reduced recruitment. No fibrillations or positive waves were noted. The abductor pollicis brevis muscle reveals 2 to 4 K units with full recruitment. No fibrillations or positive waves were noted. The extensor indicis proprius muscle reveals 1 to 3 K units with slightly reduced recruitment. No fibrillations or positive waves were noted. The pronator teres muscle reveals 2 to 6 K units  with decreased recruitment. 2+ positive waves were noted. The biceps muscle reveals no voluntary motor units with no recruitment. 2+ fibrillations and positive waves were noted. The triceps muscle reveals 2 to 4 K units with full recruitment. No fibrillations or positive waves were noted. The anterior deltoid muscle reveals no voluntary motor units with no recruitment. 2+ fibrillations and positive waves were noted.  Supraspinatus muscle reveals 1 to 2 K units with markedly reduced recruitment. 2+ fibrillations and positive waves were noted. The cervical paraspinal muscles were tested at 2 levels. No abnormalities of insertional activity were seen at the upper level, 2+ positive waves were noted at the lower level. There was good relaxation.   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  No voluntary activation of the C5 and C6 muscles was noted.  Marlan Palau MD 08/21/2017 3:32 PM  [facility] [address] [place], [place] [address]  Phone [phone] Fax [phone]

## 2017-08-26 NOTE — Progress Notes (Signed)
[facility]    Nerve / Sites Muscle Latency Ref. Amplitude Ref. Rel Amp Segments Distance Velocity Ref. Area    ms ms mV mV %  cm m/s m/s mVms  L Median - APB     Wrist APB 3.6 ?4.4 6.2 ?4.0 100 Wrist - APB 7   24.3     Upper arm APB 8.5  6.3  100 Upper arm - Wrist 25 51 ?49 23.4  R Median - APB     Wrist APB 5.3 ?4.4 8.2 ?4.0 100 Wrist - APB 7   30.9     Upper arm APB 10.2  7.4  89.6 Upper arm - Wrist 254 524 ?49 30.0  L Ulnar - ADM     Wrist ADM 3.4 ?3.3 6.3 ?6.0 100 Wrist - ADM 7   20.9     B.Elbow ADM 7.6  6.1  97.3 B.Elbow - Wrist 21 50 ?49 20.6     A.Elbow ADM 9.5  6.1  99.4 A.Elbow - B.Elbow 10 51 ?49 20.6         A.Elbow - Wrist      R Ulnar - ADM     Wrist ADM 3.1 ?3.3 6.1 ?6.0 100 Wrist - ADM 7   18.9     B.Elbow ADM 7.4  5.5  90.7 B.Elbow - Wrist 22 51 ?49 20.2     A.Elbow ADM 9.2  4.7  85.1 A.Elbow - B.Elbow 10 58 ?49 19.9         A.Elbow - Wrist      L Peroneal - EDB     Ankle EDB 8.8 ?6.5 1.3 ?2.0 100 Ankle - EDB 9   4.6     Fib head EDB 18.5  1.5  110 Fib head - Ankle 43 44 ?44 5.0     Pop fossa EDB 19.6  1.5  98.8 Pop fossa - Fib head 53 509 ?44 5.0         Pop fossa - Ankle      L Tibial - AH     Ankle AH 7.2 ?5.8 4.3 ?4.0 100 Ankle - AH 9   10.3     Pop fossa AH 17.7  3.2  75.3 Pop fossa - Ankle 45 43 ?41 9.6                 SNC    Nerve / Sites Rec. Site Peak Lat Amp Segments Distance    ms V  cm  L Median - Orthodromic (Dig II, Mid palm)     Dig II Wrist 3.6 16 Dig II - Wrist 13  R Median - Orthodromic (Dig II, Mid palm)     Dig II Wrist 5.0 8 Dig II - Wrist 13  L Ulnar - Orthodromic, (Dig V, Mid palm)     Dig V Wrist 3.2 4 Dig V - Wrist 11  R Ulnar - Orthodromic, (Dig V, Mid palm)     Dig V Wrist 3.8 6 Dig V - Wrist 11             SNC    Nerve / Sites Rec. Site Peak Lat  Amp Segments Peak Diff    ms V  ms  L Superficial peroneal - Ankle     2 Ankle NR NR 2 - Lat leg NR       F  Wave    Nerve F Lat Ref.   ms ms  L Median - APB 32.8 ?31.0  L  Ulnar - ADM 32.7 ?32.0  R Median - APB 35.2 ?31.0  R Ulnar - ADM 32.1 ?32.0

## 2017-08-28 ENCOUNTER — Inpatient Hospital Stay: Payer: Self-pay | Admitting: Internal Medicine

## 2017-09-02 MED FILL — ALLOPURINOL 300 MG TABS: 300 | 30 days supply | Qty: 30 | Fill #1

## 2017-09-02 MED FILL — WARFARIN SODIUM 5 MG TABLET: 5 | 30 days supply | Qty: 45 | Fill #1

## 2017-09-02 MED FILL — MECLIZINE 25 MG TABLET: 25 | 10 days supply | Qty: 30 | Fill #0

## 2017-09-04 ENCOUNTER — Other Ambulatory Visit: Payer: Self-pay

## 2017-09-04 ENCOUNTER — Encounter: Payer: Self-pay | Admitting: Internal Medicine

## 2017-09-04 ENCOUNTER — Ambulatory Visit: Payer: Medicaid Other | Attending: Internal Medicine | Admitting: Internal Medicine

## 2017-09-04 VITALS — BP 149/90 | HR 79 | Temp 98.2°F | Resp 18 | Ht 71.0 in | Wt 193.0 lb

## 2017-09-04 DIAGNOSIS — R51 Headache: Secondary | ICD-10-CM

## 2017-09-04 DIAGNOSIS — R55 Syncope and collapse: Secondary | ICD-10-CM | POA: Insufficient documentation

## 2017-09-04 DIAGNOSIS — M4722 Other spondylosis with radiculopathy, cervical region: Secondary | ICD-10-CM | POA: Diagnosis not present

## 2017-09-04 DIAGNOSIS — R202 Paresthesia of skin: Secondary | ICD-10-CM | POA: Diagnosis not present

## 2017-09-04 DIAGNOSIS — R200 Anesthesia of skin: Secondary | ICD-10-CM | POA: Diagnosis not present

## 2017-09-04 DIAGNOSIS — R519 Headache, unspecified: Secondary | ICD-10-CM

## 2017-09-04 MED ORDER — CLOPIDOGREL BISULFATE 75 MG PO TABS: 75.0000 mg | ORAL_TABLET | Freq: Every day | ORAL | 0 refills | Status: DC

## 2017-09-04 MED ORDER — METHOCARBAMOL 750 MG PO TABS: 750.0000 mg | ORAL_TABLET | Freq: Three times a day (TID) | ORAL | 1 refills | Status: DC | PRN

## 2017-09-04 MED ORDER — MECLIZINE HCL 25 MG PO TABS: 25.0000 mg | ORAL_TABLET | Freq: Three times a day (TID) | ORAL | 1 refills | Status: DC | PRN

## 2017-09-04 MED ORDER — ROSUVASTATIN CALCIUM 40 MG PO TABS
40.0000 mg | ORAL_TABLET | Freq: Every day | ORAL | 2 refills | Status: DC
Start: 2017-09-04 — End: 2018-04-28

## 2017-09-04 MED ORDER — PANTOPRAZOLE SODIUM 40 MG PO TBEC: 40.0000 mg | DELAYED_RELEASE_TABLET | Freq: Every day | ORAL | 1 refills | Status: DC

## 2017-09-04 MED ORDER — GABAPENTIN 400 MG PO CAPS: 400.0000 mg | ORAL_CAPSULE | Freq: Two times a day (BID) | ORAL | 3 refills | Status: DC

## 2017-09-04 NOTE — Progress Notes (Signed)
Russell Carter, is a 61 y.o. male  ZOX:[id number]  [id number]  DOB - [birth year]  Chief Complaint  Patient presents with  . Follow-up       Subjective:   Russell Carter is a 61 y.o. male  with complex medical history including HTN, CAD, CVA, MI in 2008,Type 2 DMand cervical stenosis who presents here today for an urgent care visit. Patient has been passing out, 3 episodes in the last 2 months, did not go to the ED. Last episode happened at another Doctors office while waiting for his appointment. When he was called, he got up from his seat, then went back down immedicately and pass out for a few seconds. Patient has No headache, No chest pain, No abdominal pain - No Nausea, No new weakness tingling or numbness, No Cough - SOB.  Problem  Syncope and Collapse  Acute Intractable Headache    ALLERGIES: Allergies  Allergen Reactions  . Shellfish Allergy Anaphylaxis    PAST MEDICAL HISTORY: Past Medical History:  Diagnosis Date  . Anginal pain (HCC)   . Arthritis   . Blood in stool   . CAD (coronary artery disease)    a. 2011: cath showing mod non-obstructive disease b. 02/2016: cath with 95% stenosis in the Mid RCA. DES placed.  . Cervical stenosis of spine   . Cocaine abuse (HCC)    history  . Depression   . Diabetes mellitus without complication (HCC)   . Gout, unspecified   . Headache   . HTN (hypertension)   . Hypercholesterolemia   . Hyperglycemia   . Migraines   . Myocardial infarction [facility](HCC) 2008   drug cocaine and marijuana at time of mi none  since    . Pneumonia   . Renal insufficiency   . Stroke [facility](HCC)     MEDICATIONS AT HOME: Prior to Admission medications   Medication Sig Start Date End Date Taking? Authorizing Provider  allopurinol (ZYLOPRIM) 300 MG tablet Take 1 tablet (300 mg total) by mouth daily. 05/02/17  Yes Quentin AngstJegede, Laresha Bacorn E, MD  CALCIUM-VITAMIN D PO Take 1 tablet by mouth daily.   Yes [provider]  clopidogrel (PLAVIX) 75 MG  tablet Take 1 tablet (75 mg total) daily by mouth. 09/04/17  Yes Christyanna Mckeon E, MD  DULoxetine (CYMBALTA) 30 MG capsule Take 1 capsule (30 mg total) by mouth 2 (two) times daily. 08/07/17  Yes York SpanielWillis, Charles K, MD  ferrous sulfate 325 (65 FE) MG tablet Take 325 mg by mouth 3 (three) times daily with meals.   Yes [provider]  gabapentin (NEURONTIN) 400 MG capsule Take 1 capsule (400 mg total) 2 (two) times daily by mouth. 09/04/17  Yes Destony Prevost E, MD  hydrOXYzine (ATARAX/VISTARIL) 25 MG tablet Take 1 tablet (25 mg total) by mouth every 6 (six) hours as needed for anxiety. 07/27/17  Yes Quentin AngstJegede, Ronson Hagins E, MD  ibuprofen (ADVIL,MOTRIN) 800 MG tablet Take 800 mg by mouth daily.   Yes [provider]  meclizine (ANTIVERT) 25 MG tablet Take 1 tablet (25 mg total) 3 (three) times daily as needed by mouth for dizziness. 09/04/17  Yes Quentin AngstJegede, Vannie Hilgert E, MD  methocarbamol (ROBAXIN) 750 MG tablet Take 1 tablet (750 mg total) every 8 (eight) hours as needed by mouth for muscle spasms. 09/04/17  Yes Quentin AngstJegede, Grey Schlauch E, MD  metoCLOPramide (REGLAN) 10 MG tablet Take 1 tablet (10 mg total) by mouth every 6 (six) hours as needed for nausea (or headache). 07/14/17  Yes  Dione Booze, MD  nitroGLYCERIN (NITROSTAT) 0.4 MG SL tablet Place 1 tablet (0.4 mg total) under the tongue every 5 (five) minutes as needed for chest pain. 05/02/17  Yes Quentin Angst, MD  omeprazole (PRILOSEC) 20 MG capsule Take 1 capsule (20 mg total) by mouth daily. 07/31/17  Yes Quentin Angst, MD  pantoprazole (PROTONIX) 40 MG tablet Take 1 tablet (40 mg total) at bedtime by mouth. 09/04/17  Yes Jeanann Lewandowsky E, MD  warfarin (COUMADIN) 5 MG tablet Take 1.5 tablets Mondays, Wednesdays, and Fridays. Take 1 tablet all other days. 07/24/17  Yes Quentin Angst, MD  rosuvastatin (CRESTOR) 40 MG tablet Take 1 tablet (40 mg total) at bedtime by mouth. 09/04/17 12/03/17  Quentin Angst,  MD    Objective:   Vitals:   09/04/17 1435  BP: (!) 149/90  Pulse: 79  Resp: 18  Temp: 98.2 F (36.8 C)  TempSrc: Oral  SpO2: 98%  Weight: 193 lb (87.5 kg)  Height: 5\' 11"  (1.803 m)   Exam General appearance : Awake, alert, not in any distress. Speech Clear. Not toxic looking HEENT: Atraumatic and Normocephalic, pupils equally reactive to light and accomodation Neck: Supple, no JVD. No cervical lymphadenopathy.  Chest: Good air entry bilaterally, no added sounds  CVS: S1 S2 regular, no murmurs.  Abdomen: Bowel sounds present, Non tender and not distended with no gaurding, rigidity or rebound. Extremities: B/L Lower Ext shows no edema, both legs are warm to touch Neurology: Awake alert, and oriented X 3, CN II-XII intact, Non focal Skin: No Rash  Data Review Lab Results  Component Value Date   HGBA1C 4.9 05/01/2017   HGBA1C 6.2 (H) 01/21/2017   HGBA1C 5.9 (H) 03/13/2016    Assessment & Plan   1. Syncope and collapse  - ECHOCARDIOGRAM COMPLETE; Future - US Carotid Duplex Bilateral; Future  2. Numbness and tingling in both hands  - No new symptom - Continue Gabapentin  3. Cervical spondylosis with radiculopathy  - clopidogrel (PLAVIX) 75 MG tablet; Take 1 tablet (75 mg total) daily by mouth.  Dispense: 30 tablet; Refill: 0 - gabapentin (NEURONTIN) 400 MG capsule; Take 1 capsule (400 mg total) 2 (two) times daily by mouth.  Dispense: 180 capsule; Refill: 3  4. Acute intractable headache, unspecified headache type  - meclizine (ANTIVERT) 25 MG tablet; Take 1 tablet (25 mg total) 3 (three) times daily as needed by mouth for dizziness.  Dispense: 30 tablet; Refill: 1  Patient have been counseled extensively about nutrition and exercise. Other issues discussed during this visit include: low cholesterol diet, weight control and daily exercise, importance of adherence with medications and regular follow-up. We also discussed long term complications of uncontrolled and  hypertension.   Return in about 4 weeks (around 10/02/2017) for BP Check, Nurse Visit, CPP Stacey for BP/CBG and DM management.  The patient was given clear instructions to go to ER or return to medical center if symptoms don't improve, worsen or new problems develop. The patient verbalized understanding. The patient was told to call to get lab results if they haven't heard anything in the next week.   This note has been created with Education officer, environmental. Any transcriptional errors are unintentional.    Jeanann Lewandowsky, MD, MHA, FACP, FAAP, CPE [facility] [place], [place] [phone]   09/04/2017, 3:45 PM

## 2017-09-04 NOTE — Patient Instructions (Signed)
Fatigue Fatigue is feeling tired all of the time, a lack of energy, or a lack of motivation. Occasional or mild fatigue is often a normal response to activity or life in general. However, long-lasting (chronic) or extreme fatigue may indicate an underlying medical condition. Follow these instructions at home: Watch your fatigue for any changes. The following actions may help to lessen any discomfort you are feeling:  Talk to your health care provider about how much sleep you need each night. Try to get the required amount every night.  Take medicines only as directed by your health care provider.  Eat a healthy and nutritious diet. Ask your health care provider if you need help changing your diet.  Drink enough fluid to keep your urine clear or pale yellow.  Practice ways of relaxing, such as yoga, meditation, massage therapy, or acupuncture.  Exercise regularly.  Change situations that cause you stress. Try to keep your work and personal routine reasonable.  Do not abuse illegal drugs.  Limit alcohol intake to no more than 1 drink per day for nonpregnant women and 2 drinks per day for men. One drink equals 12 ounces of beer, 5 ounces of wine, or 1 ounces of hard liquor.  Take a multivitamin, if directed by your health care provider.  Contact a health care provider if:  Your fatigue does not get better.  You have a fever.  You have unintentional weight loss or gain.  You have headaches.  You have difficulty: ? Falling asleep. ? Sleeping throughout the night.  You feel angry, guilty, anxious, or sad.  You are unable to have a bowel movement (constipation).  You skin is dry.  Your legs or another part of your body is swollen. Get help right away if:  You feel confused.  Your vision is blurry.  You feel faint or pass out.  You have a severe headache.  You have severe abdominal, pelvic, or back pain.  You have chest pain, shortness of breath, or an irregular or  fast heartbeat.  You are unable to urinate or you urinate less than normal.  You develop abnormal bleeding, such as bleeding from the rectum, vagina, nose, lungs, or nipples.  You vomit blood.  You have thoughts about harming yourself or committing suicide.  You are worried that you might harm someone else. This information is not intended to replace advice given to you by your health care provider. Make sure you discuss any questions you have with your health care provider. Document Released: 08/05/2007 Document Revised: 03/15/2016 Document Reviewed: 02/09/2014 Elsevier Interactive Patient Education  2018 [employer] Syncope Syncope is when you lose temporarily pass out (faint). Signs that you may be about to pass out include:  Feeling dizzy or light-headed.  Feeling sick to your stomach (nauseous).  Seeing all white or all black.  Having cold, clammy skin.  If you passed out, get help right away. Call your local emergency services (911 in the [place] Do not drive yourself to the hospital. Follow these instructions at home: Pay attention to any changes in your symptoms. Take these actions to help with your condition:  Have someone stay with you until you feel stable.  Do not drive, use machinery, or play sports until your doctor says it is okay.  Keep all follow-up visits as told by your doctor. This is important.  If you start to feel like you might pass out, lie down right away and raise (elevate) your feet above the level of  your heart. Breathe deeply and steadily. Wait until all of the symptoms are gone.  Drink enough fluid to keep your pee (urine) clear or pale yellow.  If you are taking blood pressure or heart medicine, get up slowly and spend many minutes getting ready to sit and then stand. This can help with dizziness.  Take over-the-counter and prescription medicines only as told by your doctor.  Get help right away if:  You have a very bad headache.  You  have unusual pain in your chest, tummy, or back.  You are bleeding from your mouth or rectum.  You have black or tarry poop (stool).  You have a very fast or uneven heartbeat (palpitations).  It hurts to breathe.  You pass out once or more than once.  You have jerky movements that you cannot control (seizure).  You are confused.  You have trouble walking.  You are very weak.  You have vision problems. These symptoms may be an emergency. Do not wait to see if the symptoms will go away. Get medical help right away. Call your local emergency services (911 in the [place] Do not drive yourself to the hospital. This information is not intended to replace advice given to you by your health care provider. Make sure you discuss any questions you have with your health care provider. Document Released: 03/26/2008 Document Revised: 03/15/2016 Document Reviewed: 06/22/2015 Elsevier Interactive Patient Education  [employer].

## 2017-09-14 ENCOUNTER — Other Ambulatory Visit: Payer: Self-pay

## 2017-09-14 ENCOUNTER — Emergency Department (HOSPITAL_COMMUNITY)
Admission: EM | Admit: 2017-09-14 | Discharge: 2017-09-15 | Disposition: A | Discharge status: Home / Self Care | Payer: Medicaid Other | Attending: Emergency Medicine | Admitting: Emergency Medicine

## 2017-09-14 DIAGNOSIS — N183 Chronic kidney disease, stage 3 (moderate): Secondary | ICD-10-CM | POA: Insufficient documentation

## 2017-09-14 DIAGNOSIS — E1122 Type 2 diabetes mellitus with diabetic chronic kidney disease: Secondary | ICD-10-CM | POA: Insufficient documentation

## 2017-09-14 DIAGNOSIS — Z955 Presence of coronary angioplasty implant and graft: Secondary | ICD-10-CM | POA: Insufficient documentation

## 2017-09-14 DIAGNOSIS — Z79899 Other long term (current) drug therapy: Secondary | ICD-10-CM | POA: Diagnosis not present

## 2017-09-14 DIAGNOSIS — K047 Periapical abscess without sinus: Secondary | ICD-10-CM | POA: Insufficient documentation

## 2017-09-14 DIAGNOSIS — I129 Hypertensive chronic kidney disease with stage 1 through stage 4 chronic kidney disease, or unspecified chronic kidney disease: Secondary | ICD-10-CM | POA: Diagnosis not present

## 2017-09-14 DIAGNOSIS — Z7901 Long term (current) use of anticoagulants: Secondary | ICD-10-CM | POA: Diagnosis not present

## 2017-09-14 DIAGNOSIS — R51 Headache: Secondary | ICD-10-CM | POA: Diagnosis present

## 2017-09-14 DIAGNOSIS — I2510 Atherosclerotic heart disease of native coronary artery without angina pectoris: Secondary | ICD-10-CM | POA: Diagnosis not present

## 2017-09-14 DIAGNOSIS — R55 Syncope and collapse: Secondary | ICD-10-CM | POA: Diagnosis not present

## 2017-09-14 NOTE — ED Triage Notes (Signed)
Patient with seizure disorder, not on any meds at this time.  1st seizure was earlier this year.  Patient is hypertensive, had three seizures in the last hour, was able to speak to EMS during the episode.  Patient was ridged during episodes.  Patient received 2.5mg  midazolam en route to ED.

## 2017-09-14 NOTE — ED Provider Notes (Signed)
[name] EMERGENCY DEPARTMENT Provider Note   CSN: [id number] Arrival date & time: 09/14/17  2326     History   Chief Complaint Chief Complaint  Patient presents with  . Seizures    HPI Russell Carter is a 61 y.o. male.  The patient presents to the emergency department for syncopal episode.  Patient reports that he passed out while trying to go to sleep 2 times today.  He reports that he laid down on his bed and then "blacked out" for a while.  He is complaining of left-sided facial and tooth pain.  He reports that this is been ongoing for 2 weeks.  He thinks there is drainage inside his mouth secondary to an infected tooth.  He called EMS tonight to be evaluated for the passing out spells.  While in their care he had strange shaking motions of his extremities, but was able to talk while this was occurring.  Because of this activity, patient was administered 2.5 mg of Versed during transport.  Patient tells me that the passing out is not new today.  This has been ongoing for some time.  He saw his primary care doctor, Dr. Hyman Hopes for this.  He tells me that he was going to get outpatient carotid studies performed, but he has not been called for this yet.      Past Medical History:  Diagnosis Date  . Anginal pain (HCC)   . Arthritis   . Blood in stool   . CAD (coronary artery disease)    a. 2011: cath showing mod non-obstructive disease b. 02/2016: cath with 95% stenosis in the Mid RCA. DES placed.  . Cervical stenosis of spine   . Cocaine abuse (HCC)    history  . Depression   . Diabetes mellitus without complication (HCC)   . Gout, unspecified   . Headache   . HTN (hypertension)   . Hypercholesterolemia   . Hyperglycemia   . Migraines   . Myocardial infarction [facility]) 2008   drug cocaine and marijuana at time of mi none  since    . Pneumonia   . Renal insufficiency   . Stroke [facility])     Patient Active Problem List   Diagnosis Date Noted  .  Syncope and collapse 09/04/2017  . Left arm weakness 08/07/2017  . Numbness and tingling in both hands 07/31/2017  . Gastroesophageal reflux disease without esophagitis 07/31/2017  . Moderate episode of recurrent major depressive disorder (HCC) 07/31/2017  . 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 Chronic pain disorder 06/05/2017  . Supratherapeutic INR 04/27/2017  . Gastrointestinal hemorrhage with melena   . Blood loss anemia 04/25/2017  . Pulmonary embolism (HCC) 02/14/2017  . Fatigue associated with anemia   . Post-operative pain   . Radicular pain   . Diabetes mellitus type 2 in nonobese (HCC)   . Surgery, elective   . Postoperative pain   . Leukocytosis   . Thrombocytopenia (HCC)   . History of myocardial infarction   . Cocaine abuse (HCC)   . Depression   . Type 2 diabetes mellitus with hyperglycemia, without long-term current use of insulin (HCC)   . Stage 3 chronic kidney disease (HCC)   . Benign essential HTN   . History of CVA (cerebrovascular accident)   . Prediabetes   . Cervical spondylosis with radiculopathy 01/24/2017  . Dyslipidemia 03/15/2016  . Unstable angina (HCC) - chest pain with complete heart block & chest pain 03/15/2016  .  Complete heart block (HCC) 03/14/2016  . Cervical spinal stenosis 03/14/2016  . Chest pain with moderate risk of acute coronary syndrome 03/13/2016  . CAD-50% LAD 2011 12/14/2014  . Gout 11/16/2014  . Acute intractable headache 07/15/2013  . Polysubstance abuse (HCC) 01/15/2013  . DIZZINESS 06/05/2010  . Essential hypertension 05/18/2009  . Acute myocardial infarction (HCC) 05/18/2009  . MUSCLE SPASM 05/18/2009    Past Surgical History:  Procedure Laterality Date  . ANTERIOR CERVICAL DECOMP/DISCECTOMY FUSION N/A 01/24/2017   Procedure: Cervical Four-Five,Cervical Five Six,Cervical Six-Seven Anterior cervical discectomy with fusion and plate with Cervical Three-Thoracic- One  Laminectomy/Foraminotomy with Cervical Three-Thoracic- One Fixation and fusion;  Surgeon: Loura Halt Ditty, MD;  Location: [facility] OR;  Service: Neurosurgery;  Laterality: N/A;  Part-1 anterior approach  . CARDIAC CATHETERIZATION  2008; ~ 2011   [place]-40%lad  . CARDIAC CATHETERIZATION N/A 03/16/2016   Procedure: Left Heart Cath and Coronary Angiography;  Surgeon: Marykay Lex, MD;  Location: [facility] INVASIVE CV LAB;  Service: Cardiovascular;  Laterality: N/A;  . CORONARY ANGIOPLASTY     STENTS  . EYE SURGERY Bilateral    "for double vision"  . INGUINAL HERNIA REPAIR  05/12/2012   Procedure: HERNIA REPAIR INGUINAL ADULT;  Surgeon: Liz Malady, MD;  Location: [facility];  Service: General;  Laterality: Right;  . INGUINAL HERNIA REPAIR Left 02/02/2003   Dr. Violeta Gelinas. repair with mesh. Same procedure done again in  05/22/2004   . INGUINAL HERNIA REPAIR Left ?date 2nd OR  . POSTERIOR CERVICAL FUSION/FORAMINOTOMY N/A 01/24/2017   Procedure: Cervical Three-Thoracic One-Laminectomy/Foraminotomy with Cervical Three-Thoracic One Fixation and fusion;  Surgeon: Loura Halt Ditty, MD;  Location: [facility] OR;  Service: Neurosurgery;  Laterality: N/A;  Part-2 Posterior approach  . RECONSTRUCT / STABILIZE DISTAL ULNA    . SHOULDER OPEN ROTATOR CUFF REPAIR Left 2010  . SHOULDER SURGERY Left    "injured on the job; had to cut small bone out"  . THUMB FUSION Right        Home Medications    Prior to Admission medications   Medication Sig Start Date End Date Taking? Authorizing Provider  allopurinol (ZYLOPRIM) 300 MG tablet Take 1 tablet (300 mg total) by mouth daily. 05/02/17  Yes Jegede, Olugbemiga E, MD  amLODipine (NORVASC) 5 MG tablet Take 5 mg by mouth daily. 07/24/17  Yes [provider]  CALCIUM-VITAMIN D PO Take 1 tablet by mouth daily.   Yes [provider]  clopidogrel (PLAVIX) 75 MG tablet Take 1 tablet (75 mg total) daily by mouth. 09/04/17  Yes Jegede,  Olugbemiga E, MD  DULoxetine (CYMBALTA) 30 MG capsule Take 1 capsule (30 mg total) by mouth 2 (two) times daily. 08/07/17  Yes York Spaniel, MD  ferrous sulfate 325 (65 FE) MG tablet Take 325 mg by mouth 3 (three) times daily with meals.   Yes [provider]  gabapentin (NEURONTIN) 600 MG tablet Take 600 mg by mouth 3 (three) times daily. 07/24/17  Yes [provider]  hydrOXYzine (ATARAX/VISTARIL) 25 MG tablet Take 1 tablet (25 mg total) by mouth every 6 (six) hours as needed for anxiety. 07/27/17  Yes Quentin Angst, MD  losartan (COZAAR) 100 MG tablet Take 100 mg by mouth daily. 07/24/17  Yes [provider]  meclizine (ANTIVERT) 25 MG tablet Take 1 tablet (25 mg total) 3 (three) times daily as needed by mouth for dizziness. 09/04/17  Yes Quentin Angst, MD  methocarbamol (ROBAXIN) 750 MG tablet  Take 1 tablet (750 mg total) every 8 (eight) hours as needed by mouth for muscle spasms. 09/04/17  Yes Quentin Angst, MD  metoCLOPramide (REGLAN) 10 MG tablet Take 1 tablet (10 mg total) by mouth every 6 (six) hours as needed for nausea (or headache). 07/14/17  Yes Dione Booze, MD  nitroGLYCERIN (NITROSTAT) 0.4 MG SL tablet Place 1 tablet (0.4 mg total) under the tongue every 5 (five) minutes as needed for chest pain. 05/02/17  Yes Quentin Angst, MD  omeprazole (PRILOSEC) 20 MG capsule Take 1 capsule (20 mg total) by mouth daily. 07/31/17  Yes Jegede, Olugbemiga E, MD  OXYCODONE HCL PO Take 7.5 mg by mouth.   Yes [provider]  pantoprazole (PROTONIX) 40 MG tablet Take 1 tablet (40 mg total) at bedtime by mouth. 09/04/17  Yes Jegede, Olugbemiga E, MD  rosuvastatin (CRESTOR) 40 MG tablet Take 1 tablet (40 mg total) at bedtime by mouth. 09/04/17 12/03/17 Yes Quentin Angst, MD  warfarin (COUMADIN) 5 MG tablet Take 1.5 tablets Mondays, Wednesdays, and Fridays. Take 1 tablet all other days. Patient taking differently: 5-7.5 mg. Take 7.5  tablets Mondays, Wednesdays, and Fridays. Take 5mg  tablet all other days. 07/24/17  Yes Quentin Angst, MD  gabapentin (NEURONTIN) 400 MG capsule Take 1 capsule (400 mg total) 2 (two) times daily by mouth. Patient not taking: Reported on 09/15/2017 09/04/17   Quentin Angst, MD    Family History Family History  Problem Relation Age of Onset  . Heart attack Mother        x7. Still alive  . Heart attack Father        deceased b/c of MI  . Heart attack Brother        older, deceased b/c of MI   . Cancer Brother        bone cancer in 1 brother, ? brain cancer in other brother    Social History Social History   Tobacco Use  . Smoking status: Never Smoker  . Smokeless tobacco: Never Used  Substance Use Topics  . Alcohol use: Yes    Comment: 16 oz beer every 2-3 days  . Drug use: Yes    Types: "Crack" cocaine, Marijuana, Cocaine    Comment: 03/14/2016 "quit 2008"     Allergies   Shellfish allergy   Review of Systems Review of Systems  HENT: Positive for dental problem.   Neurological: Positive for syncope.  All other systems reviewed and are negative.    Physical Exam Updated Vital Signs BP (!) 156/99   Pulse 87   Temp 98.6 F (37 C) (Oral)   Resp 19   SpO2 96%   Physical Exam  Constitutional: He is oriented to person, place, and time. He appears well-developed and well-nourished. No distress.  HENT:  Head: Normocephalic and atraumatic.  Right Ear: Hearing normal.  Left Ear: Hearing normal.  Nose: Nose normal.  Mouth/Throat: Oropharynx is clear and moist and mucous membranes are normal. Abnormal dentition. Dental caries present.  Eyes: Conjunctivae and EOM are normal. Pupils are equal, round, and reactive to light.  Neck: Normal range of motion. Neck supple.  Cardiovascular: Regular rhythm, S1 normal and S2 normal. Exam reveals no gallop and no friction rub.  No murmur heard. Pulmonary/Chest: Effort normal and breath sounds normal. No respiratory  distress. He exhibits no tenderness.  Abdominal: Soft. Normal appearance and bowel sounds are normal. There is no hepatosplenomegaly. There is no tenderness. There is no rebound, no  guarding, no tenderness at McBurney's point and negative Murphy's sign. No hernia.  Musculoskeletal: Normal range of motion.  Neurological: He is alert and oriented to person, place, and time. He has normal strength. No cranial nerve deficit or sensory deficit. Coordination normal. GCS eye subscore is 4. GCS verbal subscore is 5. GCS motor subscore is 6.  Skin: Skin is warm, dry and intact. No rash noted. No cyanosis.  Psychiatric: He has a normal mood and affect. His speech is normal and behavior is normal. Thought content normal.  Nursing note and vitals reviewed.    ED Treatments / Results  Labs (all labs ordered are listed, but only abnormal results are displayed) Labs Reviewed  CBC WITH DIFFERENTIAL/PLATELET - Abnormal; Notable for the following components:      Result Value   RDW 16.7 (*)    All other components within normal limits  COMPREHENSIVE METABOLIC PANEL - Abnormal; Notable for the following components:   CO2 20 (*)    Glucose, Bld 105 (*)    All other components within normal limits  RAPID URINE DRUG SCREEN, HOSP PERFORMED - Abnormal; Notable for the following components:   Benzodiazepines POSITIVE (*)    All other components within normal limits  URINALYSIS, ROUTINE W REFLEX MICROSCOPIC - Abnormal; Notable for the following components:   Protein, ur 30 (*)    All other components within normal limits  PROTIME-INR  I-STAT TROPONIN, ED    EKG  EKG Interpretation  Date/Time:  Saturday September 14 2017 23:30:04 EST Ventricular Rate:  78 PR Interval:  182 QRS Duration: 84 QT Interval:  384 QTC Calculation: 437 R Axis:   49 Text Interpretation:  Normal sinus rhythm Normal ECG Confirmed by Gilda CreasePollina, Christopher J [phone]([id number]) on 09/15/2017 12:02:02 AM       Radiology No results  found.  Procedures Procedures (including critical care time)  Medications Ordered in ED Medications - No data to display   Initial Impression / Assessment and Plan / ED Course  I have reviewed the triage vital signs and the nursing notes.  Pertinent labs & imaging results that were available during my care of the patient were reviewed by me and considered in my medical decision making (see chart for details).     Patient presents to the emergency department with multiple complaints.  Patient is complaining of facial swelling and pain secondary to dental abnormality.  He might have a very early abscess.  He has widespread poor dentition with tenderness on the left side.  He does not have any fever.  Patient reports syncopal episode.  He reports 3 episodes today.  He has been experiencing ongoing issues with these episodes in the past.  He has seen his primary doctor for this and outpatient workup is apparently ongoing.  He has no focal findings today.  EMS did witness 1 of the episodes where he had some shaking but was able to follow commands and talk through the episode.  He reports a history of seizures, it is not clear if he is calling his syncopal episode seizures or not.  He has not medicated for seizures and what happened today does not seem consistent with seizure.  He was not incontinent, no postictal state.  Patient has had appropriate workup here in the ER and is safe for discharge, continue management by his primary care doctor.  Patient will be given antibiotic for his dental abscess.  Drug database indicates numerous narcotic prescriptions by different providers(20 Rx by 9 providers in  2 years), will not provide any narcotics today.  Final Clinical Impressions(s) / ED Diagnoses   Final diagnoses:  Syncope, unspecified syncope type  Dental abscess    ED Discharge Orders    None       Gilda Crease, MD 09/15/17 [phone]

## 2017-09-15 LAB — RAPID URINE DRUG SCREEN, HOSP PERFORMED
AMPHETAMINES: NOT DETECTED
Barbiturates: NOT DETECTED
Benzodiazepines: POSITIVE — AB
COCAINE: NOT DETECTED
OPIATES: NOT DETECTED
TETRAHYDROCANNABINOL: NOT DETECTED

## 2017-09-15 LAB — URINALYSIS, ROUTINE W REFLEX MICROSCOPIC
BACTERIA UA: NONE SEEN
Bilirubin Urine: NEGATIVE
Glucose, UA: NEGATIVE mg/dL
HGB URINE DIPSTICK: NEGATIVE
KETONES UR: NEGATIVE mg/dL
LEUKOCYTES UA: NEGATIVE
Nitrite: NEGATIVE
PROTEIN: 30 mg/dL — AB
SQUAMOUS EPITHELIAL / LPF: NONE SEEN
Specific Gravity, Urine: 1.026 (ref 1.005–1.030)
pH: 5 (ref 5.0–8.0)

## 2017-09-15 LAB — CBC WITH DIFFERENTIAL/PLATELET
BASOS PCT: 0 %
Basophils Absolute: 0 10*3/uL (ref 0.0–0.1)
EOS ABS: 0.1 10*3/uL (ref 0.0–0.7)
Eosinophils Relative: 2 %
HCT: 42.5 % (ref 39.0–52.0)
HEMOGLOBIN: 14 g/dL (ref 13.0–17.0)
Lymphocytes Relative: 41 %
Lymphs Abs: 2.1 10*3/uL (ref 0.7–4.0)
MCH: 27.8 pg (ref 26.0–34.0)
MCHC: 32.9 g/dL (ref 30.0–36.0)
MCV: 84.5 fL (ref 78.0–100.0)
MONO ABS: 0.4 10*3/uL (ref 0.1–1.0)
MONOS PCT: 8 %
NEUTROS PCT: 49 %
Neutro Abs: 2.5 10*3/uL (ref 1.7–7.7)
Platelets: 209 10*3/uL (ref 150–400)
RBC: 5.03 MIL/uL (ref 4.22–5.81)
RDW: 16.7 % — AB (ref 11.5–15.5)
WBC: 5 10*3/uL (ref 4.0–10.5)

## 2017-09-15 LAB — COMPREHENSIVE METABOLIC PANEL
ALBUMIN: 3.8 g/dL (ref 3.5–5.0)
ALT: 22 U/L (ref 17–63)
ANION GAP: 8 (ref 5–15)
AST: 17 U/L (ref 15–41)
Alkaline Phosphatase: 49 U/L (ref 38–126)
BUN: 14 mg/dL (ref 6–20)
CHLORIDE: 110 mmol/L (ref 101–111)
CO2: 20 mmol/L — AB (ref 22–32)
Calcium: 9 mg/dL (ref 8.9–10.3)
Creatinine, Ser: 1.16 mg/dL (ref 0.61–1.24)
GFR calc Af Amer: >60 mL/min (ref >60)
GFR calc non Af Amer: >60 mL/min (ref >60)
GLUCOSE: 105 mg/dL — AB (ref 65–99)
POTASSIUM: 3.7 mmol/L (ref 3.5–5.1)
SODIUM: 138 mmol/L (ref 135–145)
Total Bilirubin: 0.6 mg/dL (ref 0.3–1.2)
Total Protein: 6.7 g/dL (ref 6.5–8.1)

## 2017-09-15 LAB — PROTIME-INR
INR: 1.13
Prothrombin Time: 14.4 seconds (ref 11.4–15.2)

## 2017-09-15 LAB — I-STAT TROPONIN, ED: TROPONIN I, POC: 0 ng/mL (ref 0.00–0.08)

## 2017-09-15 MED ORDER — AMOXICILLIN 500 MG PO CAPS: 500.0000 mg | ORAL_CAPSULE | Freq: Three times a day (TID) | ORAL | 0 refills | Status: DC

## 2017-09-16 ENCOUNTER — Encounter: Payer: Self-pay | Admitting: Pharmacist

## 2017-09-16 MED FILL — GABAPENTIN 400 MG CAPSULE: 400 | 30 days supply | Qty: 60 | Fill #0

## 2017-09-16 MED FILL — PANTOPRAZOLE SOD DR 40 MG T: 40 | 30 days supply | Qty: 30 | Fill #0

## 2017-09-16 MED FILL — MECLIZINE 25 MG TABLET: 25 | 10 days supply | Qty: 30 | Fill #0

## 2017-09-16 MED FILL — METHOCARBAMOL 750 MG TABS: 750 | 30 days supply | Qty: 90 | Fill #0

## 2017-09-16 MED FILL — ROSUVASTATIN CALCIUM 40 MG: 40 | 30 days supply | Qty: 30 | Fill #0

## 2017-09-16 MED FILL — CLOPIDOGREL 75 MG TABLET: 75 | 30 days supply | Qty: 30 | Fill #0

## 2017-09-16 NOTE — Progress Notes (Signed)
Patient with subtherapeutic INR in ED. Patient to see Dr. Hyman Hopes 09/18/17. Will need INR at that visit.

## 2017-09-17 NOTE — Progress Notes (Signed)
Thank you for this note! Will check during OV.

## 2017-09-18 ENCOUNTER — Ambulatory Visit: Payer: Medicaid Other | Attending: Internal Medicine | Admitting: Internal Medicine

## 2017-09-18 ENCOUNTER — Ambulatory Visit: Payer: Medicaid Other | Admitting: Internal Medicine

## 2017-09-18 ENCOUNTER — Encounter: Payer: Self-pay | Admitting: Internal Medicine

## 2017-09-18 VITALS — BP 101/64 | HR 89 | Temp 98.2°F | Resp 18 | Ht 71.0 in | Wt 196.0 lb

## 2017-09-18 DIAGNOSIS — Z5181 Encounter for therapeutic drug level monitoring: Secondary | ICD-10-CM | POA: Insufficient documentation

## 2017-09-18 DIAGNOSIS — K029 Dental caries, unspecified: Secondary | ICD-10-CM

## 2017-09-18 DIAGNOSIS — I10 Essential (primary) hypertension: Secondary | ICD-10-CM | POA: Insufficient documentation

## 2017-09-18 DIAGNOSIS — F329 Major depressive disorder, single episode, unspecified: Secondary | ICD-10-CM | POA: Diagnosis not present

## 2017-09-18 DIAGNOSIS — R7303 Prediabetes: Secondary | ICD-10-CM | POA: Diagnosis not present

## 2017-09-18 DIAGNOSIS — E7800 Pure hypercholesterolemia, unspecified: Secondary | ICD-10-CM | POA: Diagnosis not present

## 2017-09-18 DIAGNOSIS — Z79899 Other long term (current) drug therapy: Secondary | ICD-10-CM | POA: Insufficient documentation

## 2017-09-18 DIAGNOSIS — N289 Disorder of kidney and ureter, unspecified: Secondary | ICD-10-CM | POA: Diagnosis not present

## 2017-09-18 DIAGNOSIS — Z91013 Allergy to seafood: Secondary | ICD-10-CM | POA: Insufficient documentation

## 2017-09-18 DIAGNOSIS — M4802 Spinal stenosis, cervical region: Secondary | ICD-10-CM | POA: Diagnosis not present

## 2017-09-18 DIAGNOSIS — I252 Old myocardial infarction: Secondary | ICD-10-CM | POA: Diagnosis not present

## 2017-09-18 DIAGNOSIS — Z7902 Long term (current) use of antithrombotics/antiplatelets: Secondary | ICD-10-CM | POA: Diagnosis not present

## 2017-09-18 DIAGNOSIS — Z8673 Personal history of transient ischemic attack (TIA), and cerebral infarction without residual deficits: Secondary | ICD-10-CM | POA: Diagnosis not present

## 2017-09-18 DIAGNOSIS — Z7901 Long term (current) use of anticoagulants: Secondary | ICD-10-CM | POA: Diagnosis not present

## 2017-09-18 DIAGNOSIS — M109 Gout, unspecified: Secondary | ICD-10-CM | POA: Insufficient documentation

## 2017-09-18 DIAGNOSIS — M1990 Unspecified osteoarthritis, unspecified site: Secondary | ICD-10-CM | POA: Insufficient documentation

## 2017-09-18 DIAGNOSIS — Z79891 Long term (current) use of opiate analgesic: Secondary | ICD-10-CM | POA: Diagnosis not present

## 2017-09-18 DIAGNOSIS — E119 Type 2 diabetes mellitus without complications: Secondary | ICD-10-CM | POA: Diagnosis not present

## 2017-09-18 DIAGNOSIS — I2510 Atherosclerotic heart disease of native coronary artery without angina pectoris: Secondary | ICD-10-CM | POA: Insufficient documentation

## 2017-09-18 LAB — POCT INR: INR: 1.2

## 2017-09-18 LAB — POCT GLYCOSYLATED HEMOGLOBIN (HGB A1C): HEMOGLOBIN A1C: 6.1

## 2017-09-18 NOTE — Progress Notes (Signed)
Russell Carter, is a 61 y.o. male  ZOX:[id number]  [id number]:[id number]  DOB - 02/23/1956  Chief Complaint  Patient presents with  . Anticoagulation      Subjective:   Russell Carter is a 61 y.o. male with complex medical history including HTN, CAD, CVA, MI in 2008,Type 2 DM and cervical stenosis who presents here today for a follow up ED visit. Patient was seen in the ED on 09/14/2017 for "syncope". Patient reported that he passed out while trying to go to sleep 2 times before getting to the ED. His work up were unremarkable. Patient has had many non-specific complaints lately and frequent ED/healthcare utilization. He said he is frustrated about his inability to work and do things he used to be able to do. Now he feels tired and weak. He however denies any suicidal ideation or thoughts. Patient has No headache, No chest pain, No abdominal pain - No Nausea, No new weakness tingling or numbness, No Cough - SOB. Patient is requesting referral to the dentist for dental care.  Problem  Dental Caries   ALLERGIES: Allergies  Allergen Reactions  . Shellfish Allergy Anaphylaxis   PAST MEDICAL HISTORY: Past Medical History:  Diagnosis Date  . Anginal pain (HCC)   . Arthritis   . Blood in stool   . CAD (coronary artery disease)    a. 2011: cath showing mod non-obstructive disease b. 02/2016: cath with 95% stenosis in the Mid RCA. DES placed.  . Cervical stenosis of spine   . Cocaine abuse (HCC)    history  . Depression   . Diabetes mellitus without complication (HCC)   . Gout, unspecified   . Headache   . HTN (hypertension)   . Hypercholesterolemia   . Hyperglycemia   . Migraines   . Myocardial infarction [facility]) 2008   drug cocaine and marijuana at time of mi none  since    . Pneumonia   . Renal insufficiency   . Stroke [facility])     MEDICATIONS AT HOME: Prior to Admission medications   Medication Sig Start Date End Date Taking? Authorizing Provider  allopurinol (ZYLOPRIM) 300 MG  tablet Take 1 tablet (300 mg total) by mouth daily. 05/02/17  Yes Koralynn Greenspan E, MD  amLODipine (NORVASC) 5 MG tablet Take 5 mg by mouth daily. 07/24/17  Yes [provider]  amoxicillin (AMOXIL) 500 MG capsule Take 1 capsule (500 mg total) by mouth 3 (three) times daily. 09/15/17  Yes Pollina, Canary Brim, MD  CALCIUM-VITAMIN D PO Take 1 tablet by mouth daily.   Yes [provider]  clopidogrel (PLAVIX) 75 MG tablet Take 1 tablet (75 mg total) daily by mouth. 09/04/17  Yes Millianna Szymborski E, MD  DULoxetine (CYMBALTA) 30 MG capsule Take 1 capsule (30 mg total) by mouth 2 (two) times daily. 08/07/17  Yes York Spaniel, MD  ferrous sulfate 325 (65 FE) MG tablet Take 325 mg by mouth 3 (three) times daily with meals.   Yes [provider]  gabapentin (NEURONTIN) 600 MG tablet Take 600 mg by mouth 3 (three) times daily. 07/24/17  Yes [provider]  hydrOXYzine (ATARAX/VISTARIL) 25 MG tablet Take 1 tablet (25 mg total) by mouth every 6 (six) hours as needed for anxiety. 07/27/17  Yes Quentin Angst, MD  losartan (COZAAR) 100 MG tablet Take 100 mg by mouth daily. 07/24/17  Yes [provider]  meclizine (ANTIVERT) 25 MG tablet Take 1 tablet (25 mg total) 3 (three) times daily as  needed by mouth for dizziness. 09/04/17  Yes Quentin AngstJegede, Jerusalem Brownstein E, MD  methocarbamol (ROBAXIN) 750 MG tablet Take 1 tablet (750 mg total) every 8 (eight) hours as needed by mouth for muscle spasms. 09/04/17  Yes Quentin AngstJegede, Daniah Zaldivar E, MD  metoCLOPramide (REGLAN) 10 MG tablet Take 1 tablet (10 mg total) by mouth every 6 (six) hours as needed for nausea (or headache). 07/14/17  Yes Dione BoozeGlick, David, MD  nitroGLYCERIN (NITROSTAT) 0.4 MG SL tablet Place 1 tablet (0.4 mg total) under the tongue every 5 (five) minutes as needed for chest pain. 05/02/17  Yes Quentin AngstJegede, Zameria Vogl E, MD  omeprazole (PRILOSEC) 20 MG capsule Take 1 capsule (20 mg total) by mouth daily. 07/31/17  Yes Amberlee Garvey,  Chinyere Galiano E, MD  OXYCODONE HCL PO Take 7.5 mg by mouth.   Yes [provider]  pantoprazole (PROTONIX) 40 MG tablet Take 1 tablet (40 mg total) at bedtime by mouth. 09/04/17  Yes Dazani Norby E, MD  rosuvastatin (CRESTOR) 40 MG tablet Take 1 tablet (40 mg total) at bedtime by mouth. 09/04/17 12/03/17 Yes Quentin AngstJegede, Marielis Samara E, MD  warfarin (COUMADIN) 5 MG tablet Take 1.5 tablets Mondays, Wednesdays, and Fridays. Take 1 tablet all other days. Patient taking differently: 5-7.5 mg. Take 7.5 tablets Mondays, Wednesdays, and Fridays. Take 5mg  tablet all other days. 07/24/17  Yes Quentin AngstJegede, Berna Gitto E, MD  gabapentin (NEURONTIN) 400 MG capsule Take 1 capsule (400 mg total) 2 (two) times daily by mouth. Patient not taking: Reported on 09/15/2017 09/04/17   Quentin AngstJegede, Maruice Pieroni E, MD    Objective:   Vitals:   09/18/17 1148  BP: 101/64  Pulse: 89  Resp: 18  Temp: 98.2 F (36.8 C)  TempSrc: Oral  SpO2: 96%  Weight: 196 lb (88.9 kg)  Height: 5\' 11"  (1.803 m)   Exam General appearance : Awake, alert, not in any distress. Speech Clear. Not toxic looking HEENT: Atraumatic and Normocephalic, pupils equally reactive to light and accomodation Neck: Supple, no JVD. No cervical lymphadenopathy.  Chest: Good air entry bilaterally, no added sounds  CVS: S1 S2 regular, no murmurs.  Abdomen: Bowel sounds present, Non tender and not distended with no gaurding, rigidity or rebound. Extremities: B/L Lower Ext shows no edema, both legs are warm to touch Neurology: Awake alert, and oriented X 3, CN II-XII intact, Non focal Skin: No Rash  Data Review Lab Results  Component Value Date   HGBA1C 6.1 09/18/2017   HGBA1C 4.9 05/01/2017   HGBA1C 6.2 (H) 01/21/2017    Assessment & Plan   1. Anticoagulated on Coumadin  - INR check and management   2. Prediabetes  - HgB A1c_: Prediabetes - Patient educated and counseled  3. Dental caries  - Ambulatory referral to Dentistry  Patient have  been counseled extensively about nutrition and exercise. Other issues discussed during this visit include: low cholesterol diet, weight control and daily exercise.   Return in about 2 days (around 09/20/2017), or if symptoms worsen or fail to improve, for INR.  The patient was given clear instructions to go to ER or return to medical center if symptoms don't improve, worsen or new problems develop. The patient verbalized understanding. The patient was told to call to get lab results if they haven't heard anything in the next week.   This note has been created with Education officer, environmentalDragon speech recognition software and smart phrase technology. Any transcriptional errors are unintentional.    Jeanann Lewandowskylugbemiga Alois Colgan, MD, MHA, FACP, FAAP, CPE [facility]  [place], [place] [phone]   09/18/2017, 12:20 PM

## 2017-09-18 NOTE — Patient Instructions (Signed)

## 2017-09-19 ENCOUNTER — Telehealth: Payer: Self-pay | Admitting: Licensed Clinical Social Worker

## 2017-09-19 NOTE — Telephone Encounter (Signed)
LCSWA received a voicemail from pt, requesting a return call.   Pt stated that he "passed out" over the holiday and accidentally broke his eye glasses. Pt stated that he has limited income and can not afford an eye exam or replacement glasses.   LCSWA informed pt of agencies that provide services; however, after further assessment pt is ineligible.   LCSWA will contact pt with additional resource information, when made available.

## 2017-09-20 ENCOUNTER — Ambulatory Visit: Payer: Medicaid Other | Attending: Internal Medicine | Admitting: Pharmacist

## 2017-09-20 DIAGNOSIS — I2782 Chronic pulmonary embolism: Secondary | ICD-10-CM

## 2017-09-20 DIAGNOSIS — Z5181 Encounter for therapeutic drug level monitoring: Secondary | ICD-10-CM | POA: Insufficient documentation

## 2017-09-20 DIAGNOSIS — Z7901 Long term (current) use of anticoagulants: Secondary | ICD-10-CM | POA: Insufficient documentation

## 2017-09-20 LAB — POCT INR: INR: 1.5

## 2017-09-24 ENCOUNTER — Ambulatory Visit (HOSPITAL_COMMUNITY)
Admission: RE | Admit: 2017-09-24 | Discharge: 2017-09-24 | Disposition: A | Discharge status: Home / Self Care | Payer: Medicaid Other | Source: Ambulatory Visit | Attending: Internal Medicine | Admitting: Internal Medicine

## 2017-09-24 ENCOUNTER — Ambulatory Visit: Payer: Medicaid Other | Attending: Internal Medicine | Admitting: Pharmacist

## 2017-09-24 DIAGNOSIS — R55 Syncope and collapse: Secondary | ICD-10-CM | POA: Diagnosis not present

## 2017-09-24 DIAGNOSIS — Z86711 Personal history of pulmonary embolism: Secondary | ICD-10-CM | POA: Insufficient documentation

## 2017-09-24 DIAGNOSIS — I10 Essential (primary) hypertension: Secondary | ICD-10-CM | POA: Insufficient documentation

## 2017-09-24 DIAGNOSIS — Z8673 Personal history of transient ischemic attack (TIA), and cerebral infarction without residual deficits: Secondary | ICD-10-CM | POA: Insufficient documentation

## 2017-09-24 DIAGNOSIS — Z5181 Encounter for therapeutic drug level monitoring: Secondary | ICD-10-CM | POA: Insufficient documentation

## 2017-09-24 DIAGNOSIS — Z7901 Long term (current) use of anticoagulants: Secondary | ICD-10-CM | POA: Diagnosis present

## 2017-09-24 DIAGNOSIS — I2782 Chronic pulmonary embolism: Secondary | ICD-10-CM

## 2017-09-24 DIAGNOSIS — I25110 Atherosclerotic heart disease of native coronary artery with unstable angina pectoris: Secondary | ICD-10-CM | POA: Insufficient documentation

## 2017-09-24 DIAGNOSIS — E119 Type 2 diabetes mellitus without complications: Secondary | ICD-10-CM | POA: Diagnosis not present

## 2017-09-24 LAB — POCT INR: INR: 3.5

## 2017-09-24 NOTE — Progress Notes (Signed)
  Echocardiogram 2D Echocardiogram has been performed.  Russell Carter 09/24/2017, 10:54 AM

## 2017-10-02 ENCOUNTER — Telehealth: Payer: Self-pay | Admitting: Internal Medicine

## 2017-10-02 ENCOUNTER — Ambulatory Visit: Payer: Medicaid Other | Attending: Internal Medicine | Admitting: Pharmacist

## 2017-10-02 DIAGNOSIS — K219 Gastro-esophageal reflux disease without esophagitis: Secondary | ICD-10-CM | POA: Diagnosis not present

## 2017-10-02 DIAGNOSIS — I10 Essential (primary) hypertension: Secondary | ICD-10-CM | POA: Insufficient documentation

## 2017-10-02 DIAGNOSIS — I2782 Chronic pulmonary embolism: Secondary | ICD-10-CM

## 2017-10-02 LAB — POCT INR: INR: 3.7

## 2017-10-02 MED ORDER — OMEPRAZOLE 20 MG PO CPDR: 20.0000 mg | DELAYED_RELEASE_CAPSULE | Freq: Every day | ORAL | 3 refills | Status: DC

## 2017-10-02 MED ORDER — AMOXICILLIN 500 MG PO CAPS: 500.0000 mg | ORAL_CAPSULE | Freq: Three times a day (TID) | ORAL | 0 refills | Status: DC

## 2017-10-02 MED ORDER — LOSARTAN POTASSIUM 100 MG PO TABS: 100.0000 mg | ORAL_TABLET | Freq: Every day | ORAL | 2 refills | Status: DC

## 2017-10-02 MED ORDER — METOCLOPRAMIDE HCL 10 MG PO TABS: 10.0000 mg | ORAL_TABLET | Freq: Four times a day (QID) | ORAL | 0 refills | Status: DC | PRN

## 2017-10-02 MED FILL — LOSARTAN POTASSIUM 100 MG T: 100 | 30 days supply | Qty: 30 | Fill #0

## 2017-10-02 MED FILL — AMOXICILLIN 500 MG CAPSULE: 500 | 5 days supply | Qty: 15 | Fill #0

## 2017-10-02 MED FILL — OMEPRAZOLE DR 20 MG CAPSULE: 20 | 30 days supply | Qty: 30 | Fill #0

## 2017-10-02 MED FILL — METOCLOPRAMIDE 10 MG TABLET: 10 | 2 days supply | Qty: 10 | Fill #0

## 2017-10-02 NOTE — Progress Notes (Signed)
   S:    Patient arrives in good spirits.  Presents to the clinic for hypertension evaluation.  Patient reports adherence with medications.  Current BP Medications include:  Amlodipine 5 mg daily and losartan 100 mg daily   Antihypertensives tried in the past include: HCTZ (stopped due to hypotension)  Reports feeling dizzy at times but felt much better off of his blood pressure medications when they were stopped in the past due to hypotension.  Patient requested ECHO results.  Patient requested refills on chronic medications and also asked for a refill for amoxicillin. He still has a tooth infection that has pus per patient report.   O:   Last 3 Office BP readings: BP Readings from Last 3 Encounters:  10/02/17 110/82  09/18/17 101/64  09/15/17 (!) 162/104    BMET    Component Value Date/Time   NA 138 09/14/2017 2351   NA 140 12/26/2016 1240   K 3.7 09/14/2017 2351   CL 110 09/14/2017 2351   CO2 20 (L) 09/14/2017 2351   GLUCOSE 105 (H) 09/14/2017 2351   BUN 14 09/14/2017 2351   BUN 20 12/26/2016 1240   CREATININE 1.16 09/14/2017 2351   CALCIUM 9.0 09/14/2017 2351   GFRNONAA >60 09/14/2017 2351   GFRAA >60 09/14/2017 2351    A/P: Hypertension longstanding currently controlled on current medications. No hypotension but due to dizziness and fall risk, will stop the amlodipine for now and continue losartan 100 mg daily. Will recheck blood pressure next week.  Discussed refill requests with Dr. Hyman Hopes and he approved refills. He reviewed ECHO and I shared the results with the patient (ECHO was normal).   Results reviewed and written information provided.   Total time in face-to-face counseling 10 minutes.   F/U Clinic Visit with me in 1 week for INR check and blood pressure check.

## 2017-10-02 NOTE — Telephone Encounter (Signed)
Pt came to the office looking for the social worker, please call him back

## 2017-10-02 NOTE — Patient Instructions (Signed)
Thanks for coming to see me  Stop the amlodipine and see if that helps with your dizziness  Your ECHO (the heart test) was normal

## 2017-10-10 ENCOUNTER — Encounter: Payer: Self-pay | Admitting: Pharmacist

## 2017-10-11 NOTE — Telephone Encounter (Signed)
LCSWA made an attempt to contact pt. LCSWA left message for a return call.

## 2017-10-17 ENCOUNTER — Ambulatory Visit: Payer: Medicaid Other | Attending: Internal Medicine | Admitting: Pharmacist

## 2017-10-17 ENCOUNTER — Ambulatory Visit: Payer: Medicaid Other | Admitting: Licensed Clinical Social Worker

## 2017-10-17 DIAGNOSIS — I2782 Chronic pulmonary embolism: Secondary | ICD-10-CM | POA: Diagnosis not present

## 2017-10-17 DIAGNOSIS — F331 Major depressive disorder, recurrent, moderate: Secondary | ICD-10-CM

## 2017-10-17 LAB — POCT INR: INR: 1.5

## 2017-10-17 MED FILL — FERROUS SULFATE 325 MG TAB: 325 (65 FE) | 30 days supply | Qty: 90 | Fill #2

## 2017-10-17 MED FILL — ALLOPURINOL 300 MG TABS: 300 | 30 days supply | Qty: 30 | Fill #2

## 2017-10-17 NOTE — BH Specialist Note (Signed)
Integrated Behavioral Health Initial Visit  MRN: [id number] Name: Russell Carter  Number of Integrated Behavioral Health Clinician visits:: 1/6 Session Start time: 2:00 PM  Session End time: 2:25 PM Total time: 25 minutes  Type of Service: Integrated Behavioral Health- Individual/Family Interpretor:No. Interpretor Name and Language: N/A   Warm Hand Off Completed.       SUBJECTIVE: Russell Carter is a 61 y.o. male accompanied by self Patient was referred by Dr. Hyman Hopes for depression and anxiety. Patient reports the following symptoms/concerns: overwhelming feelings of sadness and worry, difficulty sleeping, chronic pain, irritability, and racing thoughts Duration of problem: Ongoing; Severity of problem: severe  OBJECTIVE: Mood: Pleasant and Affect: Appropriate Risk of harm to self or others: No plan to harm self or others  LIFE CONTEXT: Family and Social: Pt receives support from his fiance, mother, and adult children School/Work: Pt has applied for disability. He has obtained a lawyer to assist him in the process Self-Care: Pt states that he prays to cope with stressors. No report of substance use Life Changes: Pt has ongoing medical conditions. He stated that he has been stressed about his adult daughter due to grandchildren being removed out of the home  GOALS ADDRESSED: Patient will: 1. Reduce symptoms of: anxiety and depression 2. Increase knowledge and/or ability of: coping skills and self-management skills  3. Demonstrate ability to: Increase healthy adjustment to current life circumstances and Increase adequate support systems for patient/family  INTERVENTIONS: Interventions utilized: Solution-Focused Strategies, Supportive Counseling, Psychoeducation and/or Health Education and Link to [employer]  Standardized Assessments completed: Not Needed  ASSESSMENT: Patient currently experiencing depression and anxiety triggered by ongoing medical conditions, chronic  pain, and his grandchildren being removed out of pt's adult daughter's home. He reports overwhelming feelings of sadness and worry, difficulty sleeping, chronic pain, irritability, and racing thoughts. Denies SI/HI/AVH.   Patient may benefit from psychoeducation and psychotherapy. He is participating in medication management through Neurologist. LCSWA educated pt on the correlation between one's physical and mental health and discussed strategies to assist with chronic pain. Pt successfully identified healthy coping skills to decrease symptoms. He is utilizing the support of his family to assist him in medication compliance. LCSWA informed pt of community resources to assist with food insecurity.   PLAN: 1. Follow up with behavioral health clinician on : Pt was encouraged to contact LCSWA if symptoms worsen or fail to improve to schedule behavioral appointments at [facility]. 2. Behavioral recommendations: LCSWA recommends that pt apply healthy coping skills discussed. Pt is encouraged to schedule follow up appointment with LCSWA 3. Referral(s): Community Resources:  Food 4. "From scale of 1-10, how likely are you to follow plan?": 8/10  Russell Larsson, LCSW 10/17/17 5:08 PM

## 2017-10-24 ENCOUNTER — Other Ambulatory Visit: Payer: Self-pay | Admitting: Internal Medicine

## 2017-10-24 ENCOUNTER — Ambulatory Visit: Payer: Medicaid Other | Attending: Internal Medicine | Admitting: Pharmacist

## 2017-10-24 DIAGNOSIS — I2782 Chronic pulmonary embolism: Secondary | ICD-10-CM | POA: Diagnosis not present

## 2017-10-24 DIAGNOSIS — M4722 Other spondylosis with radiculopathy, cervical region: Secondary | ICD-10-CM

## 2017-10-24 LAB — POCT INR: INR: 2.3

## 2017-10-28 IMAGING — RF DG C-ARM 61-120 MIN
1 series · 2 of 2 positions shown · non-contrast
Comparison: Cervical spine MRI 03/13/2016

CLINICAL DATA: Cervical fusion.

EXAM:
DG C-ARM 61-120 MIN; CERVICAL SPINE - 2-3 VIEW

[Series 1: run · 2 of 2 slices shown]
[im 1/2]
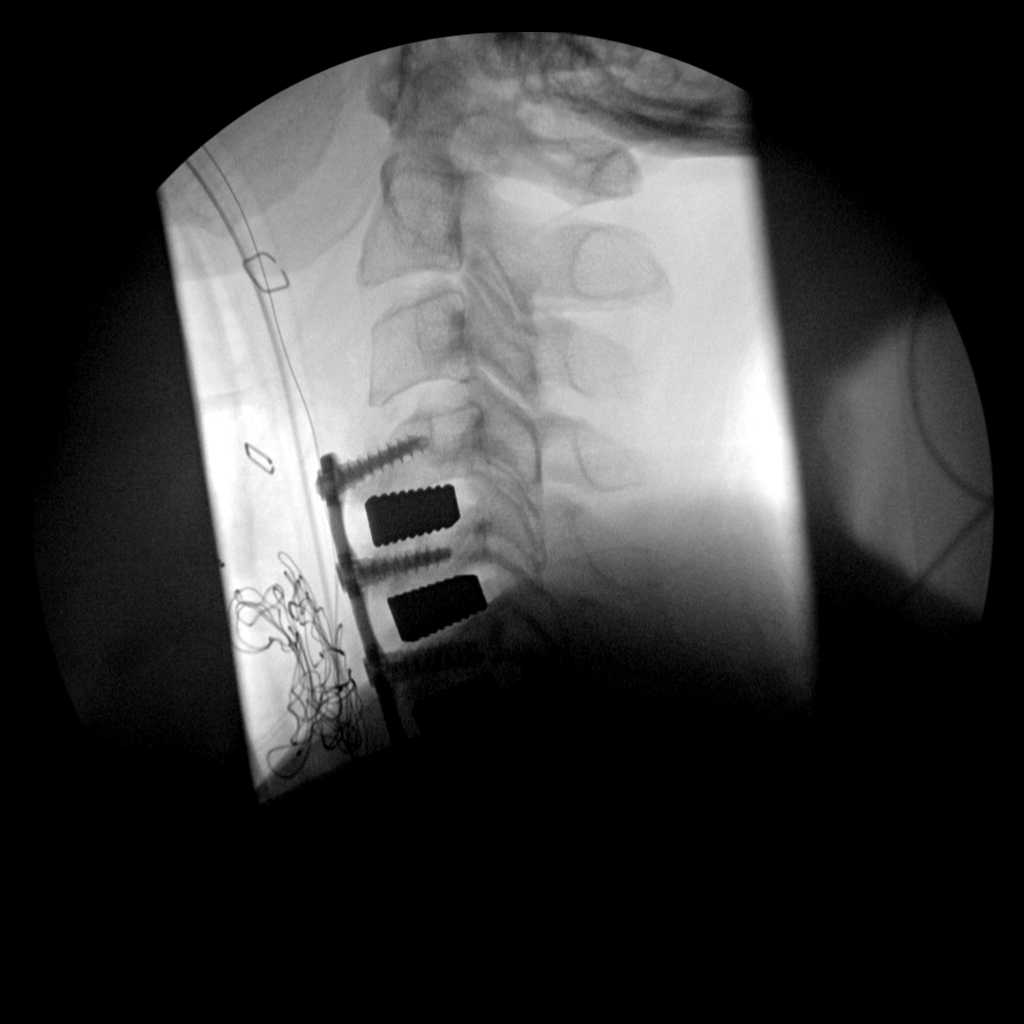
[im 2/2]
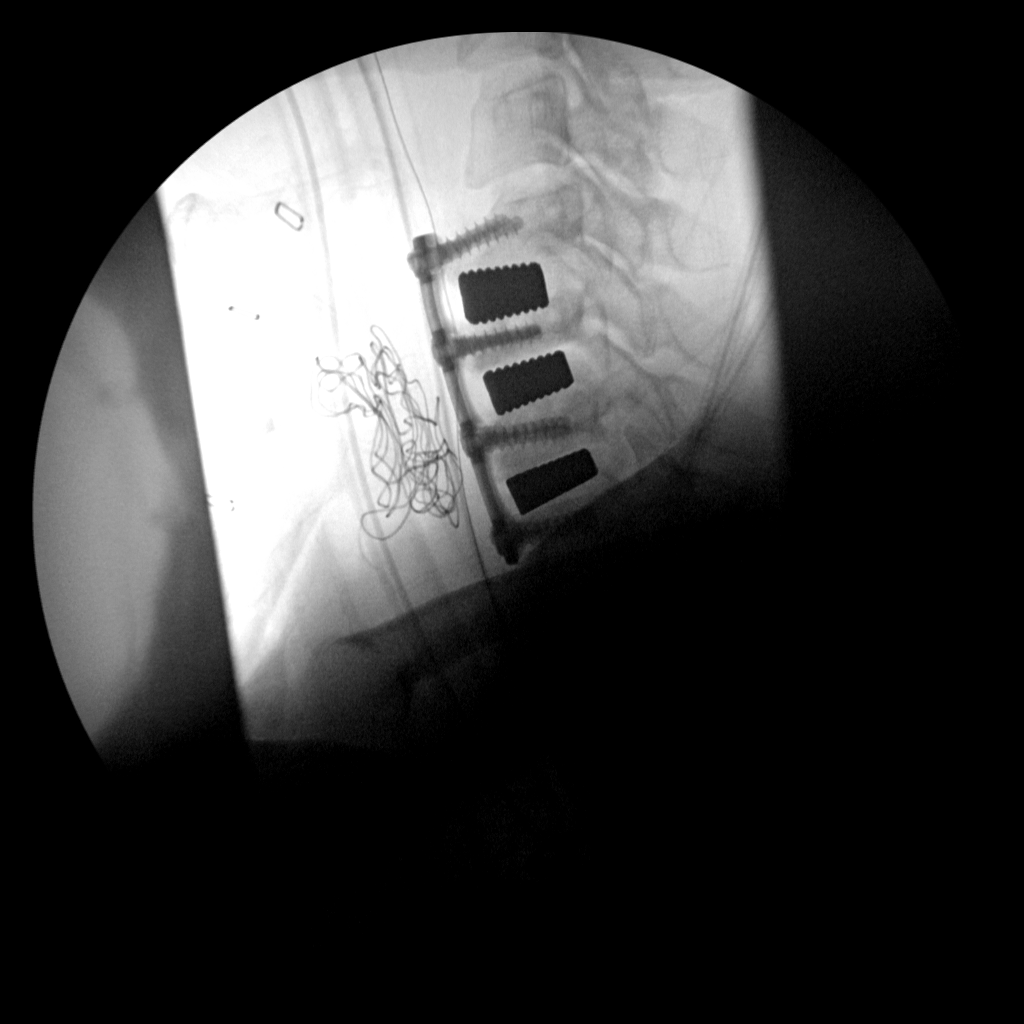

[2 of 2 positions shown; findings below may reference images not displayed]

FLUOROSCOPY TIME:  C-arm fluoroscopic images were obtained
intraoperatively and submitted for post operative interpretation.
Please see the performing provider's procedural report for the
fluoroscopy time utilized.
FINDINGS: Two lateral intraoperative spot fluoroscopic images of the cervical
spine are provided. These demonstrate the interval performance of
C4-C7 ACDF with placement of an anterior fusion plate, screws, and
interbody implants. Endotracheal tube, enteric tube, and surgical
sponges are partially visualized.
IMPRESSION: Intraoperative images during C4-C7 ACDF.

## 2017-10-28 IMAGING — CR DG CERVICAL SPINE 2 OR 3 VIEWS
1 series · 1 of 1 positions shown · non-contrast
Comparison: 01/24/2017, MRI 03/13/2016

CLINICAL DATA: A CT calf and posterior fusion

EXAM:
CERVICAL SPINE - 2-3 VIEW

[AP]
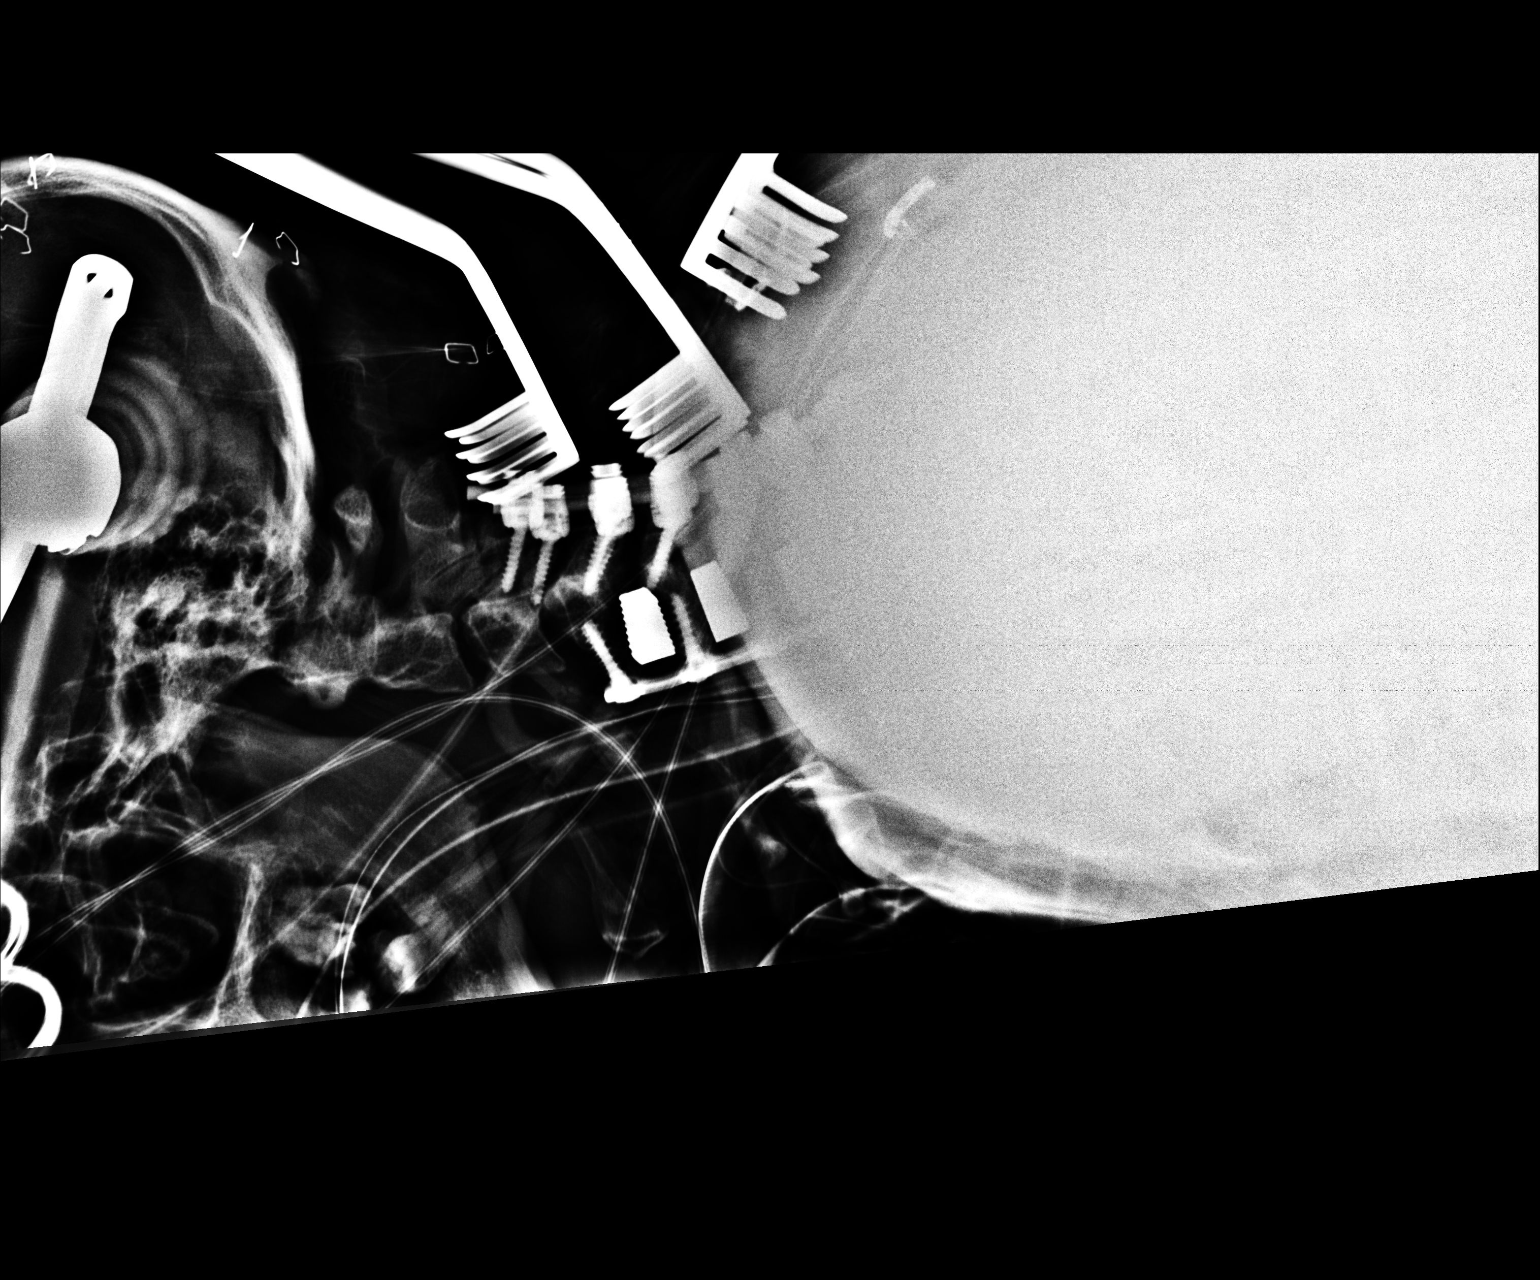

[1 of 1 positions shown; findings below may reference images not displayed]

FINDINGS: Three lateral views of the cervical spine. Staples over the
occipital and suboccipital region. Surgical devices posterior to the
cervical spine. Tips of the endotracheal and esophageal tubes are
not delineated on the limited views provided. Partially visualized
anterior surgical plate, and screw fixation with interbody device at
C4, C5, and C6 ; below C6 is not well evaluated.
IMPRESSION: Anterior surgical plate and screw fixation from C4 through C7 with
limited visualization below C6.

## 2017-10-31 LAB — HM DIABETES EYE EXAM

## 2017-10-31 IMAGING — MR MR CERVICAL SPINE W/O CM
4 of 7 series · 17 of 48 positions shown · non-contrast
Comparison: Prior MRI from 03/13/2016.

CLINICAL DATA: Initial evaluation for cervical spondylosis with
radiculopathy. Status post decompression with fusion.

EXAM:
MRI CERVICAL SPINE WITHOUT CONTRAST
TECHNIQUE: Multiplanar, multisequence MR imaging of the cervical spine was
performed. No intravenous contrast was administered.

[Series 2: T2 · sagittal · 3.0mm · 0.43mm/px · 3 of 17 slices shown (1 of 2)]
[im 1/17]
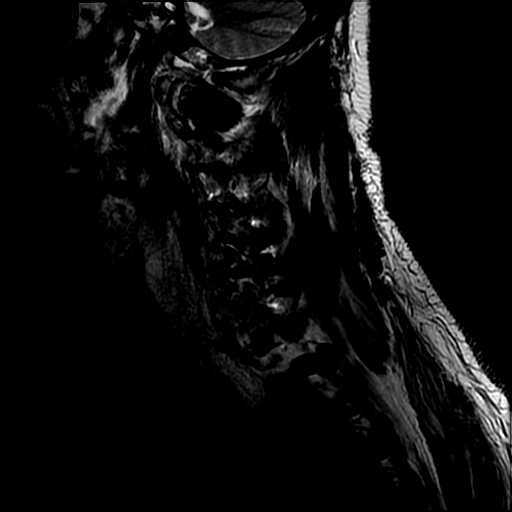
[im 9/17]
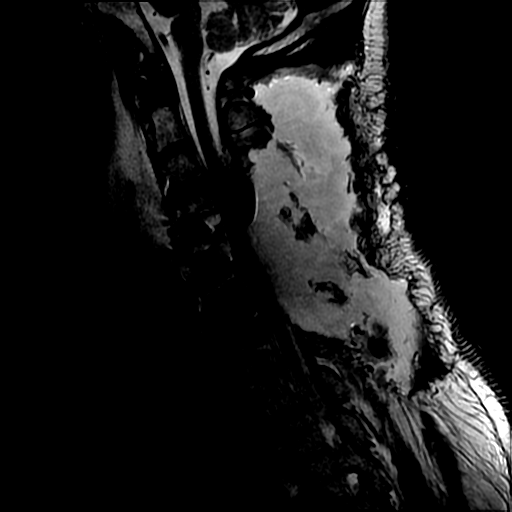
[im 17/17]
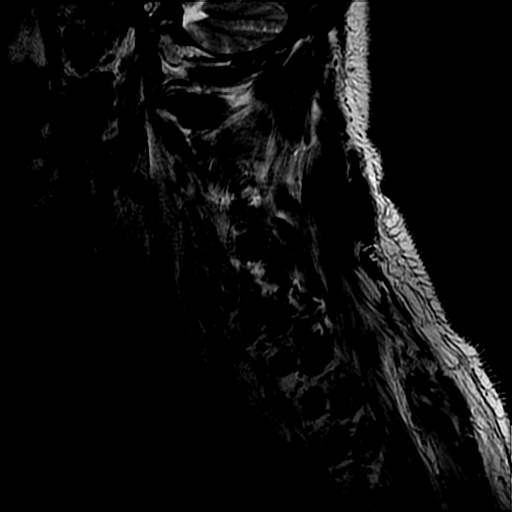

[Series 3: T1 · sagittal · 3.0mm · 0.43mm/px · 3 of 17 slices shown (1 of 2)]
[im 1/17]
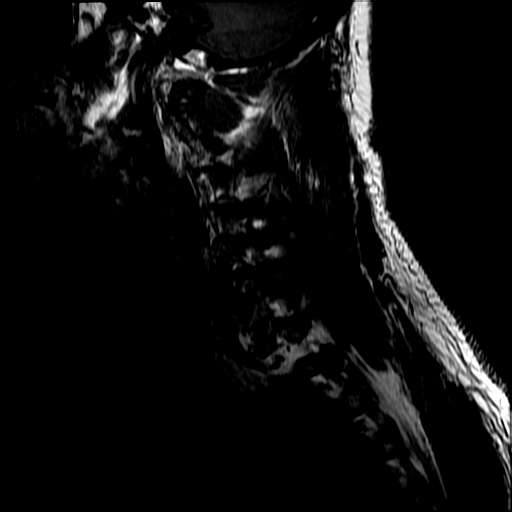
[im 11/17]
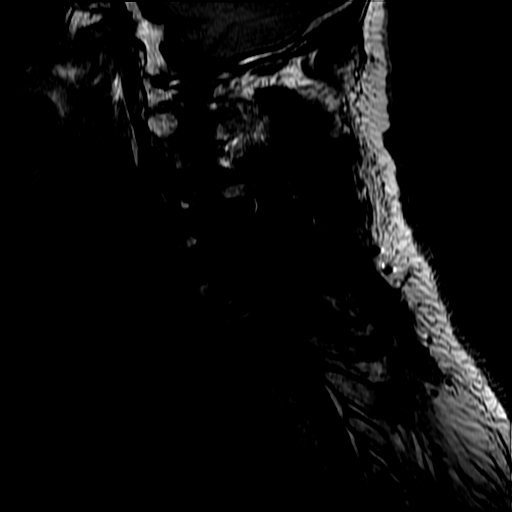
[im 17/17]
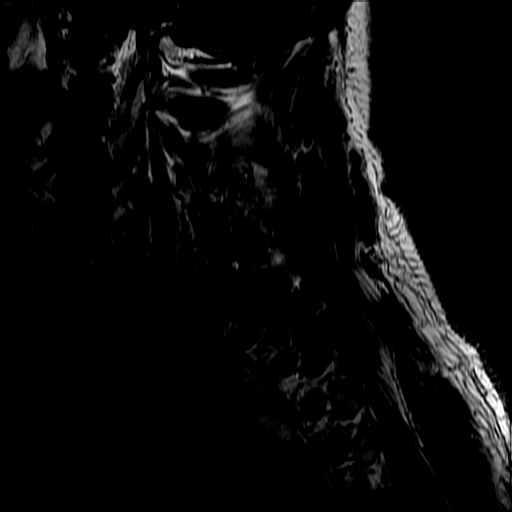

[Series 7: T2 · axial · 3.0mm · 0.43mm/px · z∈[-93,+37]mm · 8 of 45 slices shown (2 of 2)]
[im 1/45]
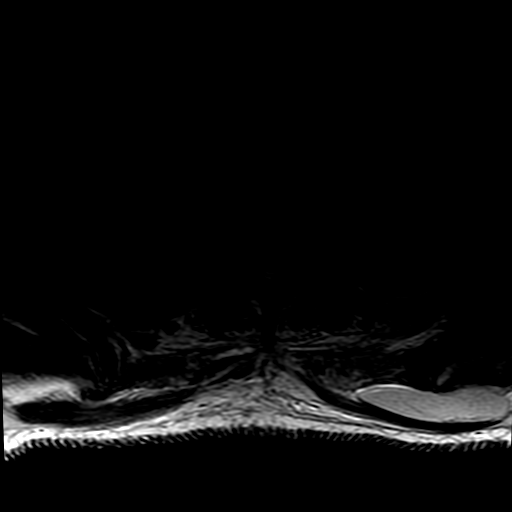
[im 5/45]
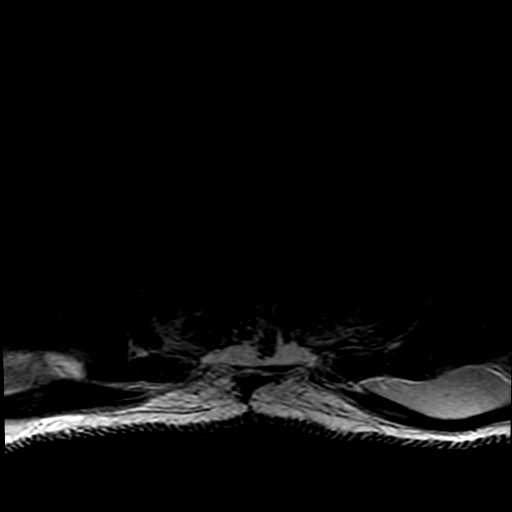
[im 9/45]
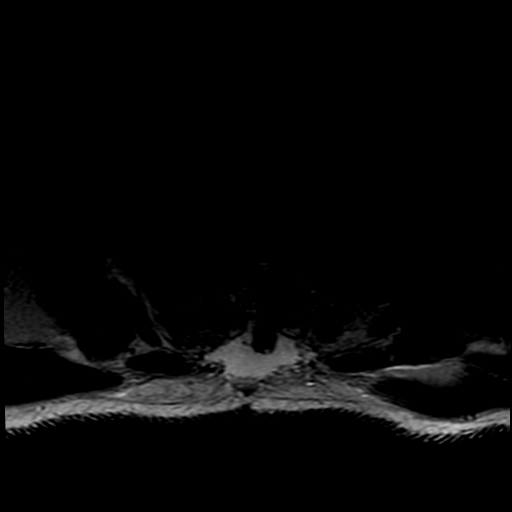
[im 14/45]
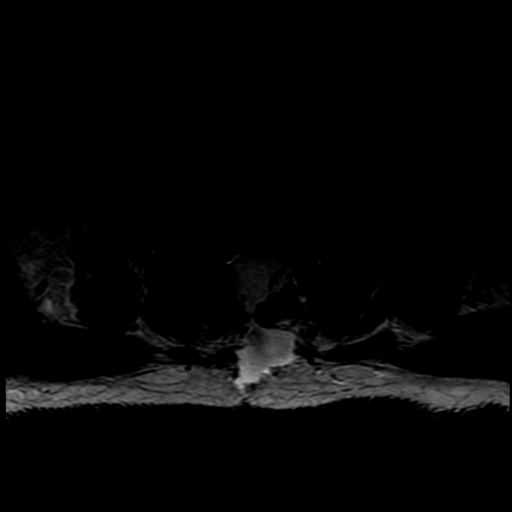
[im 18/45]
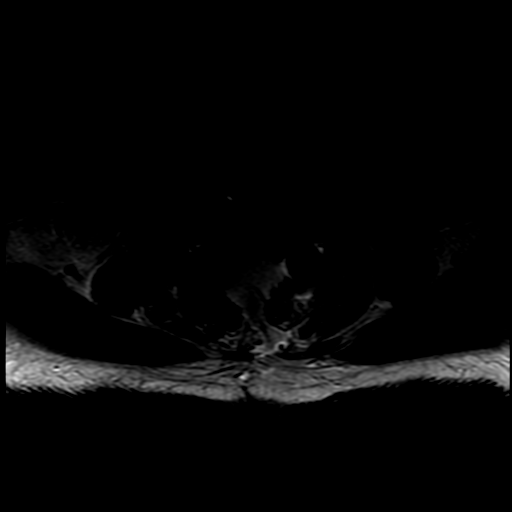
[im 23/45]
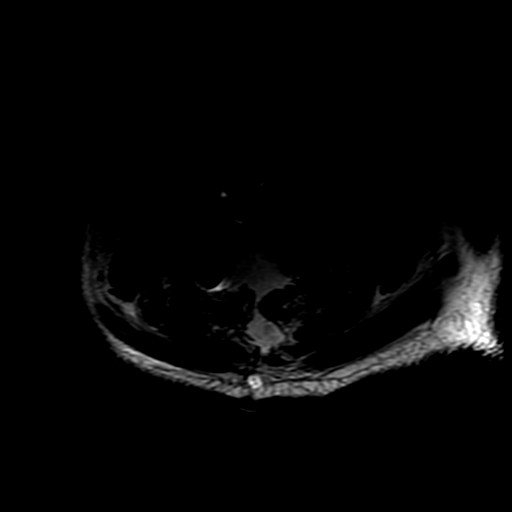
[im 27/45]
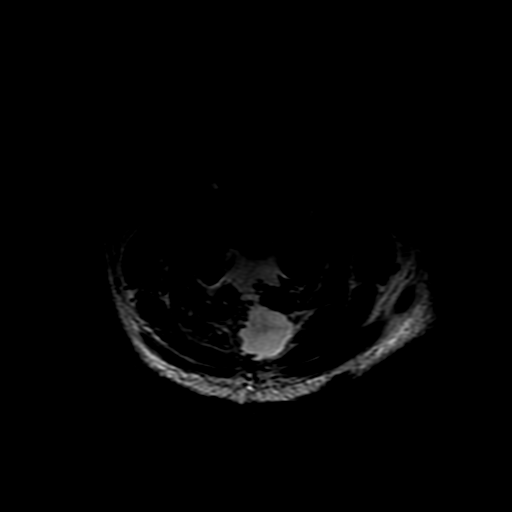
[im 40/45]
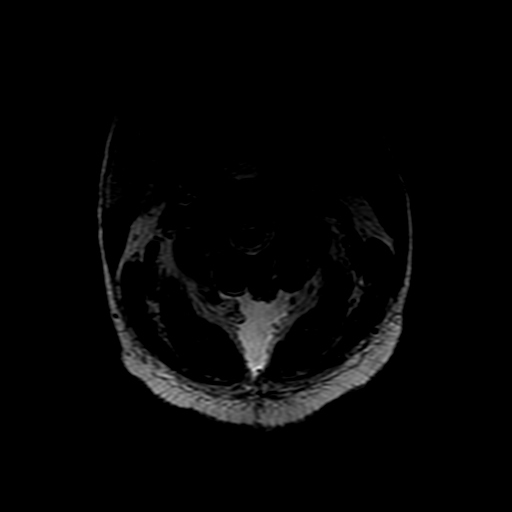

[Series 8: T1 · axial · non-contrast · 3.0mm · 0.43mm/px · z∈[-79,+37]mm · 3 of 45 slices shown (2 of 2)]
[im 5/45]
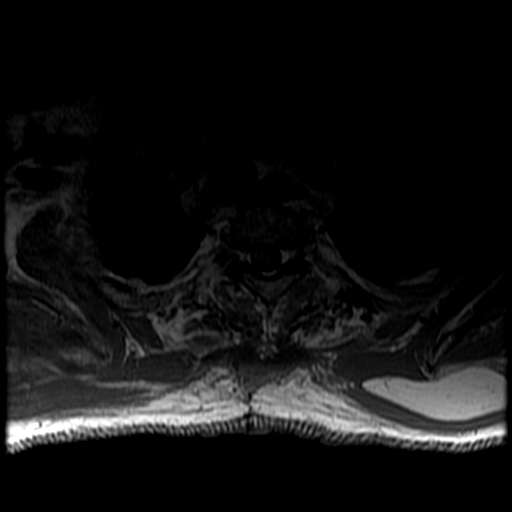
[im 23/45]
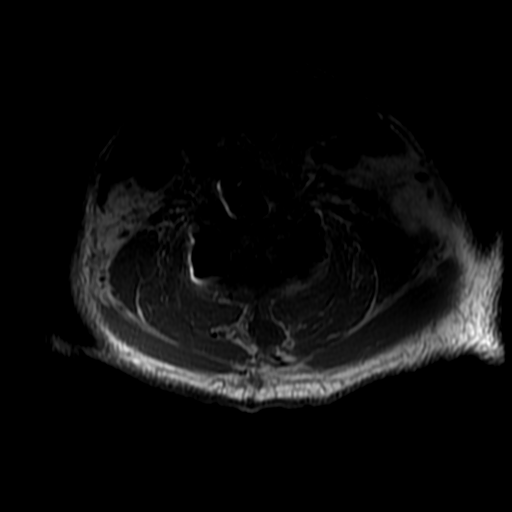
[im 40/45]
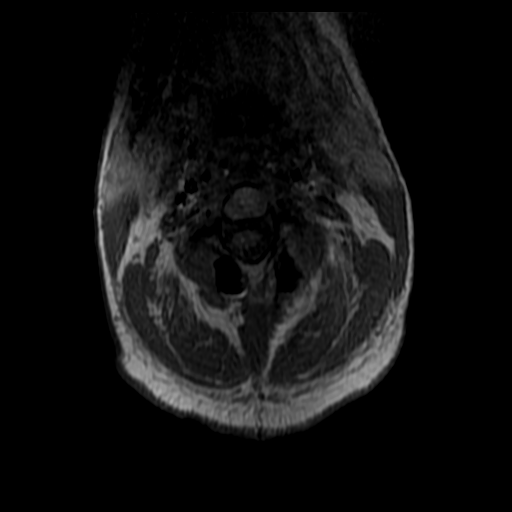

[17 of 48 positions shown; findings below may reference images not displayed]

FINDINGS: Alignment: Straightening of the normal cervical lordosis. No
listhesis.

Vertebrae: Vertebral body heights maintained. Signal intensity
within the vertebral body bone marrow is normal.

Postoperative changes from recent spinal fusion with decompression
are evident. Patient has undergone ACDF at through C7. Posterior
decompression with laminectomies have been performed at C3 through
T1. Posterior fusion in place at C3 through T1 as well. Hardware
appears well positioned.

Cord: Signal intensity within the cervical spinal cord is normal.

Posterior Fossa, vertebral arteries, paraspinal tissues: Visualized
brain and posterior fossa within normal limits. Craniocervical
junction normal.

Large T2 hyperintense collection at the posterior surgical measures
14.9 x 5.1 x 2.3 cm in greatest transaxial dimensions. Additional
fluid partially visualized subjacent to the inferior aspect of the
left trapezius muscle. Finding most likely reflects a postoperative
seroma. While possible CSF leak not entirely excluded, no sagging of
the brain at the level of the foramen magnum seen to suggest
intracranial hypotension.

Extensive prevertebral edema not with fluid with present as well
related to anterior cervical ACDF. Probable surgical drain in place.

Normal intravascular flow voids present within the vertebral
arteries bilaterally.

Disc levels:

C2-C3: Mild diffuse chronic degenerative disc osteophyte complex
mildly indents the ventral thecal sac. Mild facet arthrosis. No
significant canal or foraminal stenosis.

C3-C4: Patient is status post posterior decompression. Stable mild
diffuse disc bulge. Moderate to severe canal stenosis related to the
postoperative collection. Mild to moderate residual foraminal
narrowing, slightly worse on the left, which appears improved from
previous.

C4-C5: Status post ACDF and posterior decompression. Moderate to
severe canal stenosis related to the postoperative collection. No
appreciable residual foraminal stenosis.

C5-C6: Status post ACDF and posterior decompression. Moderate to
severe canal stenosis related to the posterior collection. No
appreciable residual foraminal narrowing.

C6-C7: Status post ACDF and posterior decompression. Moderate to
severe canal stenosis related to the posterior collection. No
appreciable residual foraminal stenosis.

C7-T1: Status post posterior decompression with fusion. Stable
degenerative disc bulge. No significant canal stenosis. Probable
improved bilateral foraminal narrowing.

Visualized upper thoracic spine within normal limits.
IMPRESSION: 1. Postoperative changes from recent ACDF at C4 through C7, with
posterior decompression and fusion at C3 through T1.
2. 14.9 x 5.1 x 2.3 cm collection at the posterior surgical site,
most likely a postoperative seroma. While CSF leak is not entirely
excluded, no sagging of the brain at the craniocervical junction to
suggest CNS hypotension identified. As always, superimposed
infection could be considered in the correct clinical setting.
3. Moderate to severe diffuse canal stenosis at C3-4 through C7-T1,
largely due to the postoperative collection. Foraminal narrowing at
these levels appears improved relative to preoperative exam from
03/13/2016.

## 2017-11-06 ENCOUNTER — Ambulatory Visit: Payer: Medicaid Other | Attending: Internal Medicine | Admitting: Pharmacist

## 2017-11-06 ENCOUNTER — Other Ambulatory Visit: Payer: Self-pay | Admitting: Internal Medicine

## 2017-11-06 DIAGNOSIS — I2699 Other pulmonary embolism without acute cor pulmonale: Secondary | ICD-10-CM | POA: Insufficient documentation

## 2017-11-06 DIAGNOSIS — I2782 Chronic pulmonary embolism: Secondary | ICD-10-CM

## 2017-11-06 LAB — POCT INR: INR: 4

## 2017-11-06 MED FILL — LOSARTAN POTASSIUM 100 MG T: 100 | 30 days supply | Qty: 30 | Fill #1

## 2017-11-06 MED FILL — MECLIZINE 25 MG TABLET: 25 | 10 days supply | Qty: 30 | Fill #1

## 2017-11-06 MED FILL — OMEPRAZOLE DR 20 MG CAPSULE: 20 | 30 days supply | Qty: 30 | Fill #1

## 2017-11-06 MED FILL — WARFARIN SODIUM 5 MG TABLET: 5 | 30 days supply | Qty: 45 | Fill #2

## 2017-11-06 MED FILL — ROSUVASTATIN CALCIUM 40 MG: 40 | 30 days supply | Qty: 30 | Fill #1

## 2017-11-06 MED FILL — METHOCARBAMOL 750 MG TABS: 750 | 30 days supply | Qty: 90 | Fill #1

## 2017-11-06 MED FILL — GABAPENTIN 400 MG CAPSULE: 400 | 30 days supply | Qty: 60 | Fill #1

## 2017-11-14 ENCOUNTER — Ambulatory Visit: Payer: Medicaid Other | Attending: Internal Medicine | Admitting: Pharmacist

## 2017-11-14 DIAGNOSIS — Z7901 Long term (current) use of anticoagulants: Secondary | ICD-10-CM | POA: Diagnosis present

## 2017-11-14 DIAGNOSIS — I2782 Chronic pulmonary embolism: Secondary | ICD-10-CM | POA: Diagnosis not present

## 2017-11-14 DIAGNOSIS — Z5181 Encounter for therapeutic drug level monitoring: Secondary | ICD-10-CM | POA: Insufficient documentation

## 2017-11-14 LAB — POCT INR: INR: 2.4

## 2017-11-18 IMAGING — DX DG CHEST 2V
2 series · 2 of 2 positions shown · non-contrast
Comparison: Chest radiograph dated 03/13/2016

CLINICAL DATA: 61-year-old male with chest pain and shortness of
breath.

EXAM:
CHEST  2 VIEW

[chest lat]
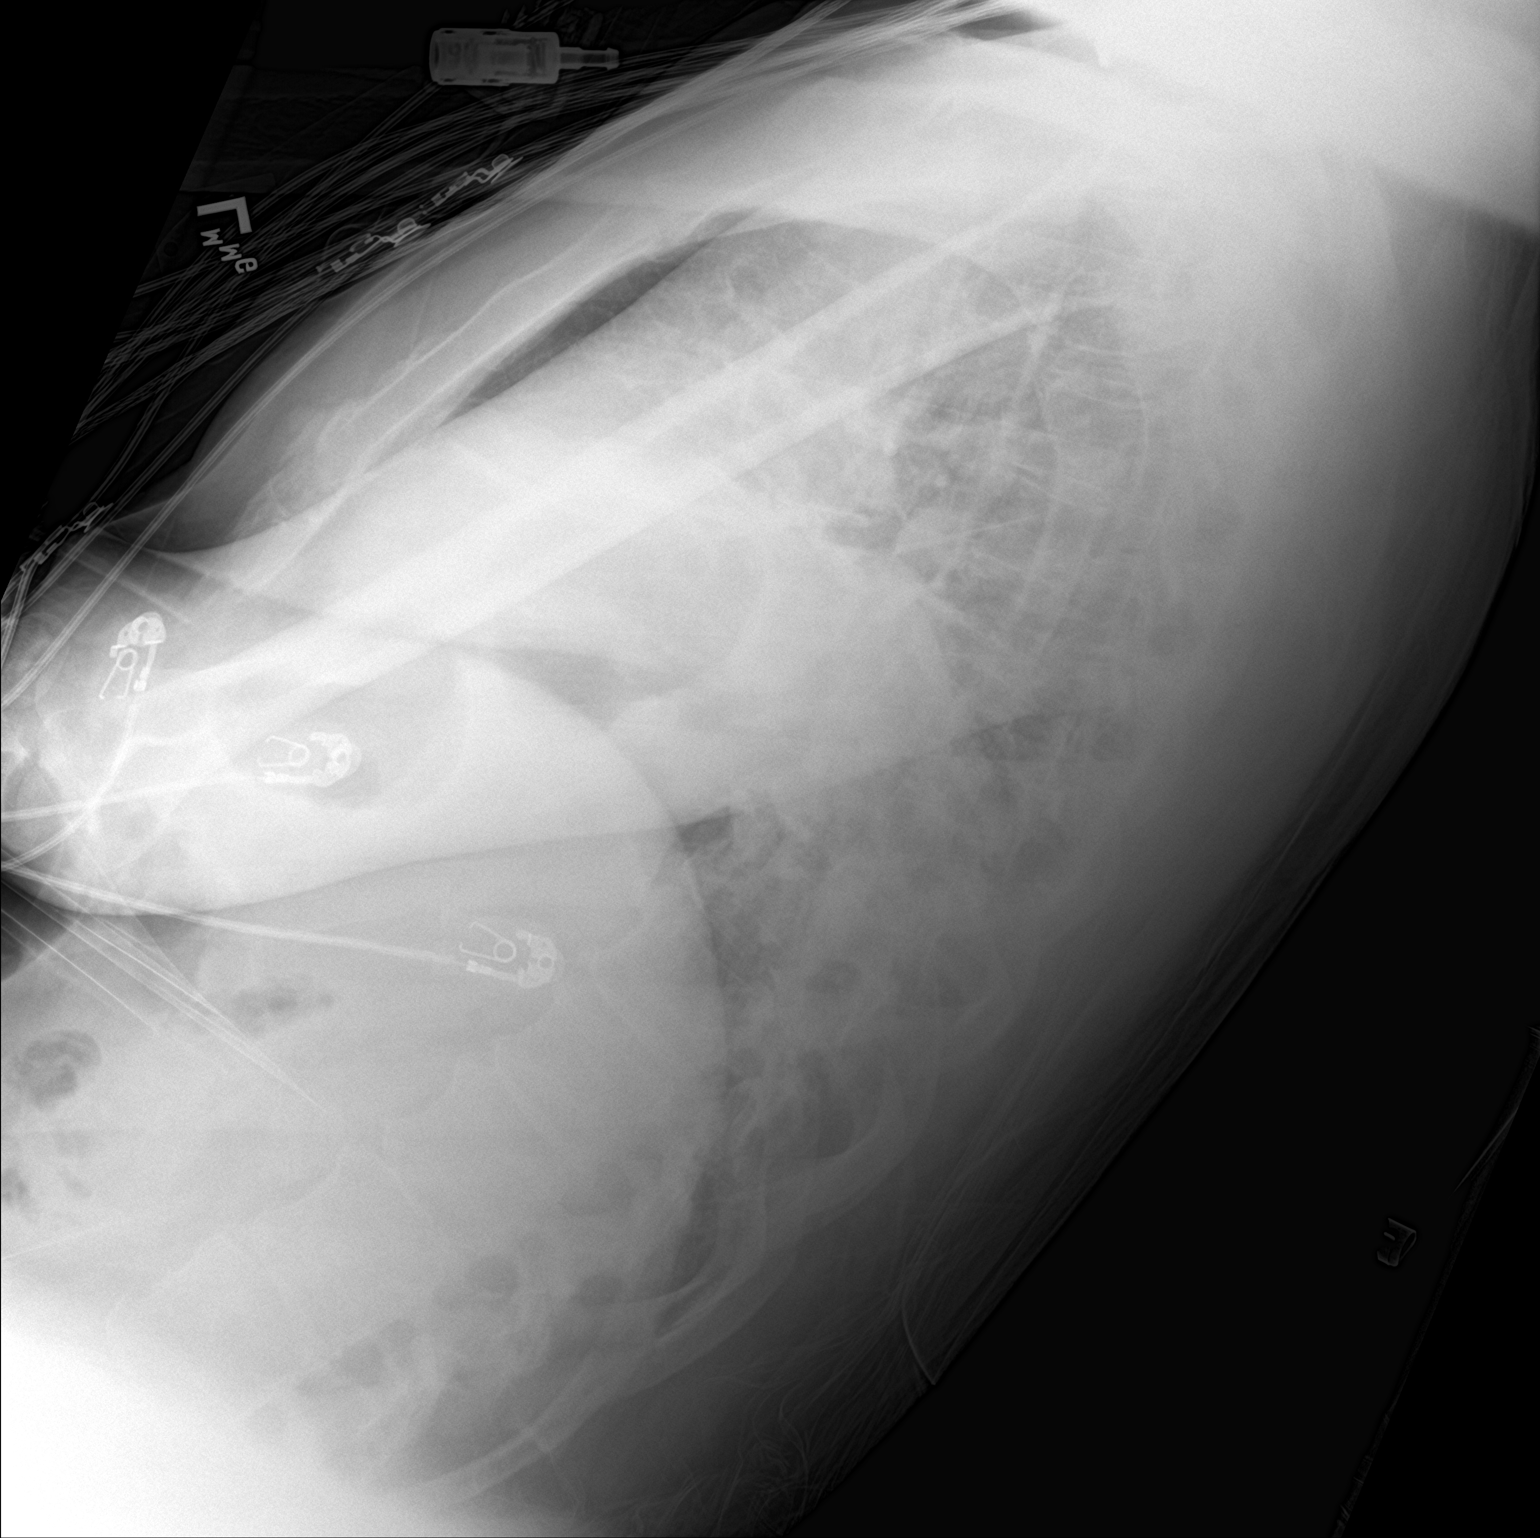

[chest ap]
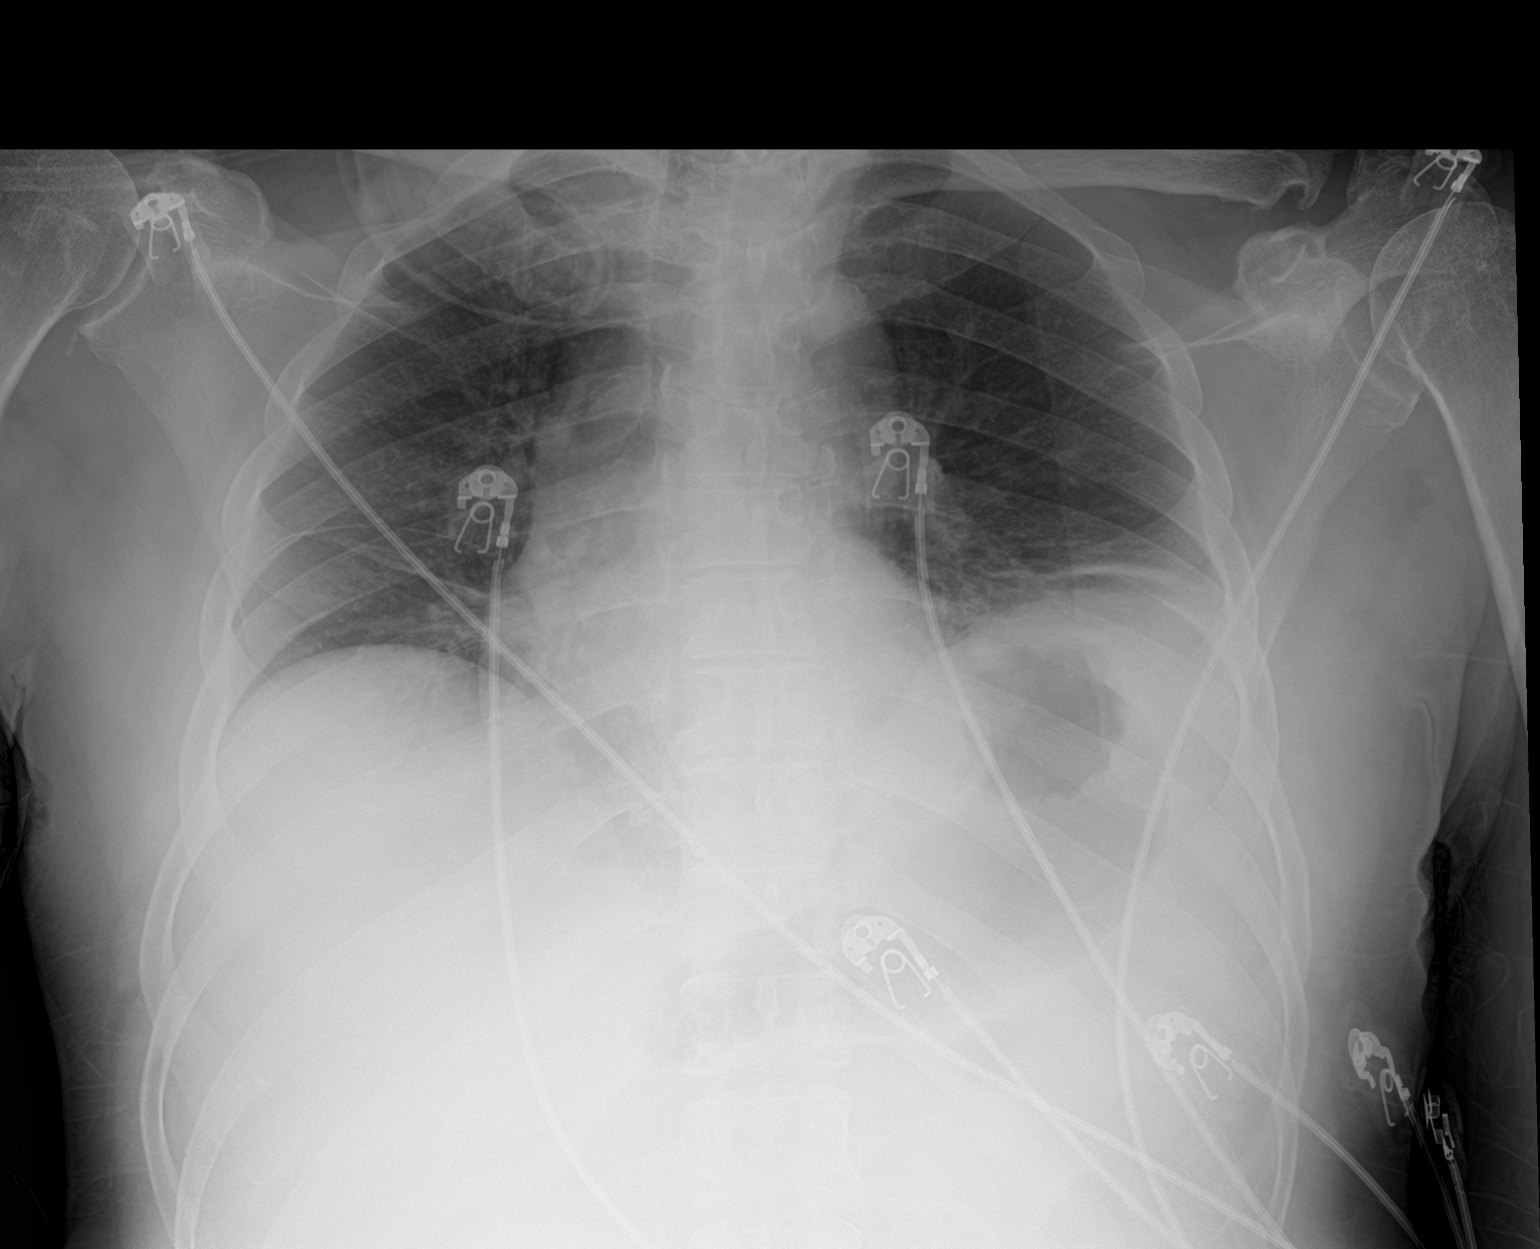

[2 of 2 positions shown; findings below may reference images not displayed]

FINDINGS: There is shallow inspiration. Left lung base linear and platelike
atelectasis. There is no focal consolidation, pleural effusion, or
pneumothorax. The cardiac silhouette is within normal limits. There
has been interval placement of cervical spine fusion hardware. No
acute osseous pathology.
IMPRESSION: Shallow inspiration with bibasilar atelectatic changes. No focal
consolidation.

## 2017-11-18 IMAGING — CT CT ANGIO CHEST
2 of 6 series · 18 of 36 positions shown · IV contrast (Omni 300)
Comparison: None.

CLINICAL DATA: Left chest pain. Left shoulder pain. Symptoms since
last night. Recent cervical spine surgery, [REDACTED].

EXAM:
CT ANGIOGRAPHY CHEST WITH CONTRAST
TECHNIQUE: Multidetector CT imaging of the chest was performed using the
standard protocol during bolus administration of intravenous
contrast. Multiplanar CT image reconstructions and MIPs were
obtained to evaluate the vascular anatomy.
CONTRAST:  100 cc Isovue 370

[Series 6: pe thins · axial · 0.67mm/px · z∈[+1365,+1548]mm · 17 of 207 slices shown]
[im 12/207  lung]
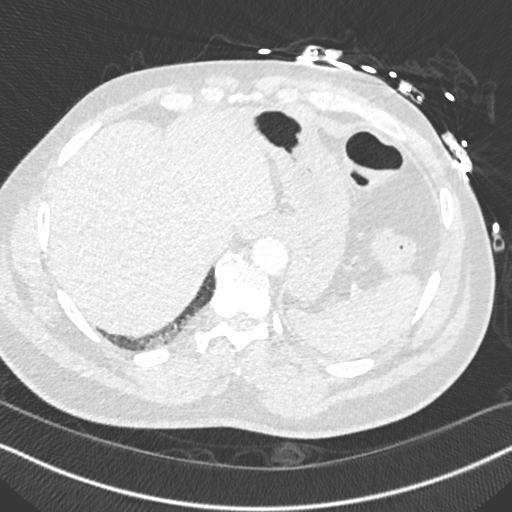
[im 23/207  mediastinal]
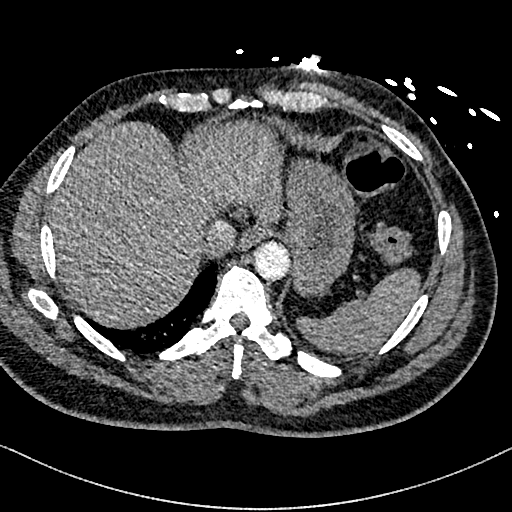
[im 35/207  lung]
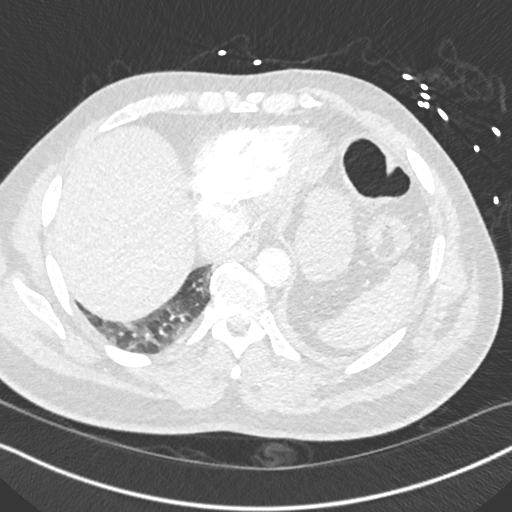
[im 46/207  mediastinal]
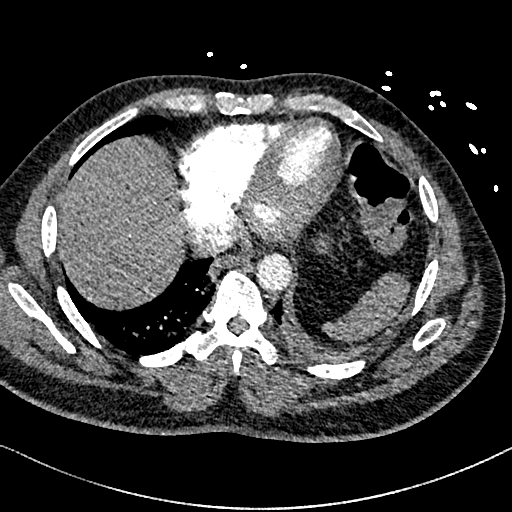
[im 58/207  lung]
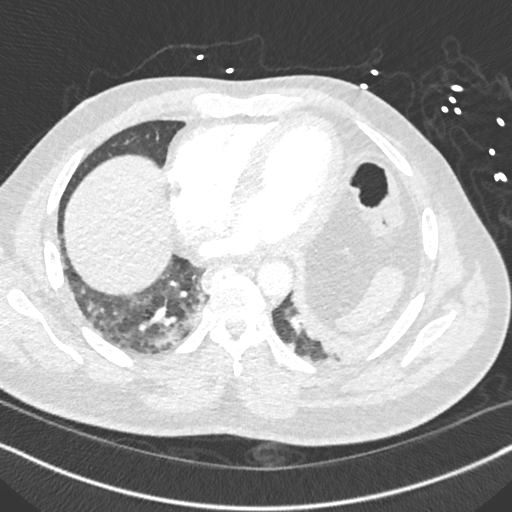
[im 69/207  mediastinal]
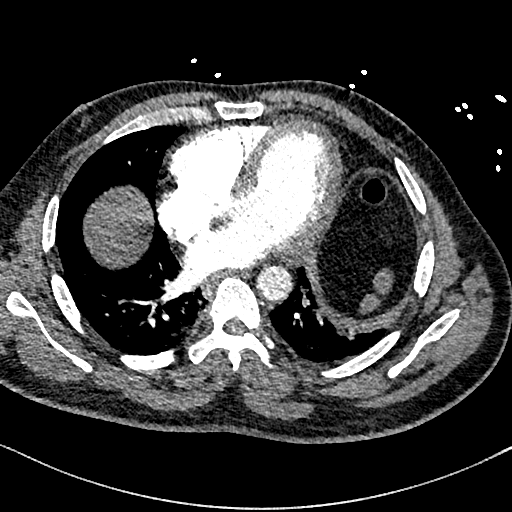
[im 81/207  lung]
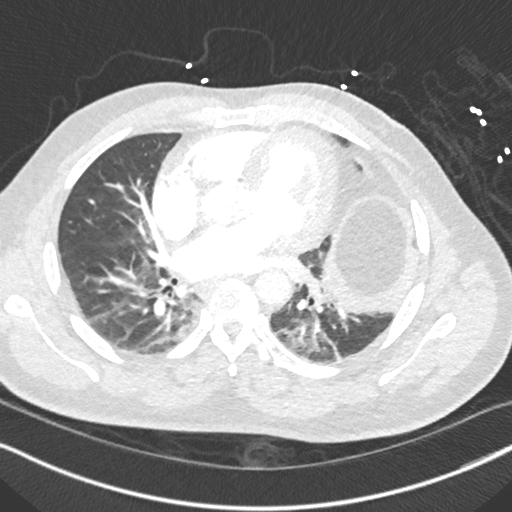
[im 92/207  mediastinal]
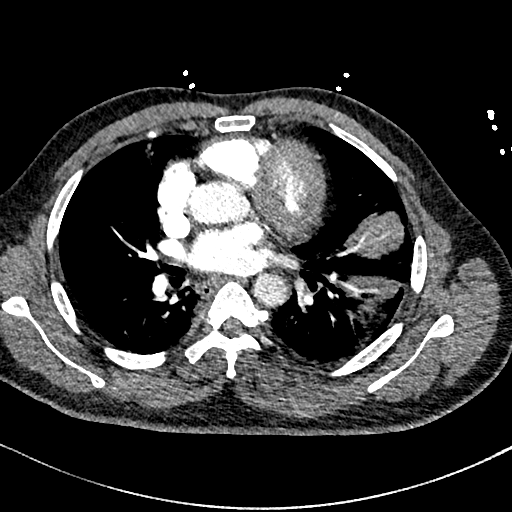
[im 104/207  lung]
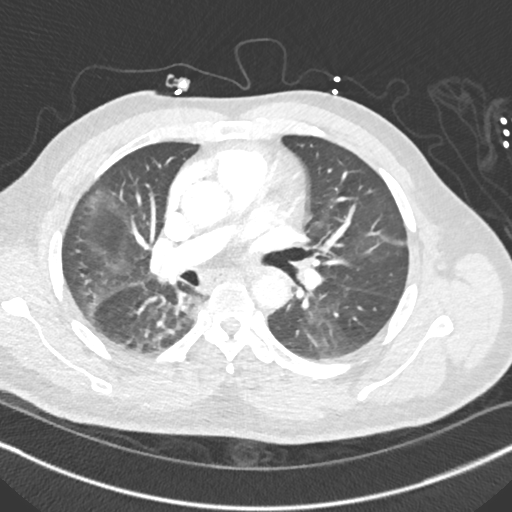
[im 115/207  mediastinal]
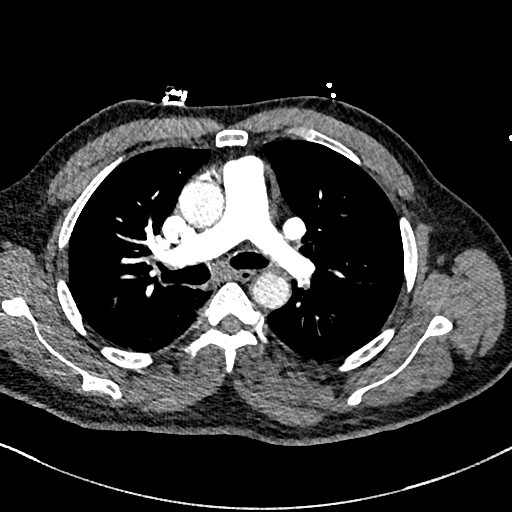
[im 126/207  lung]
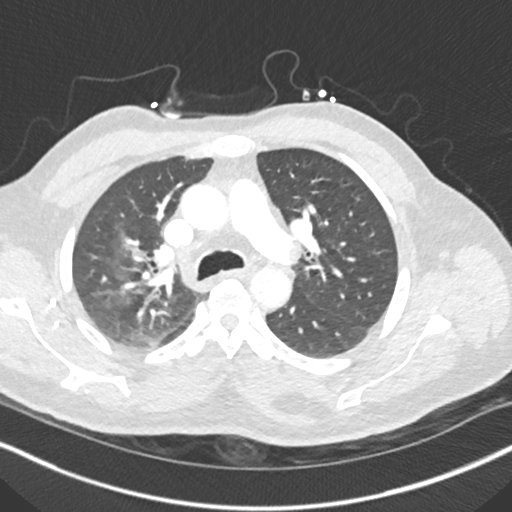
[im 138/207  mediastinal]
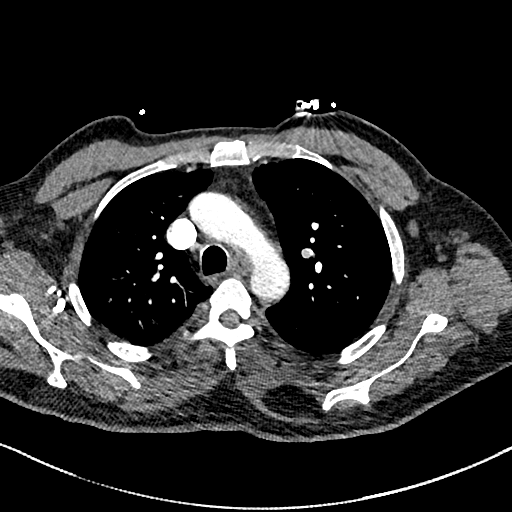
[im 149/207  lung]
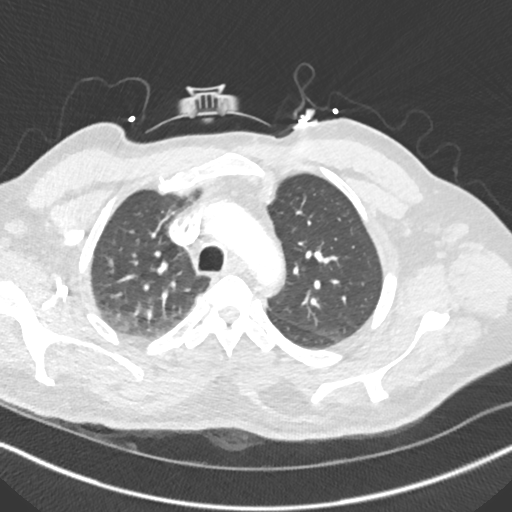
[im 161/207  mediastinal]
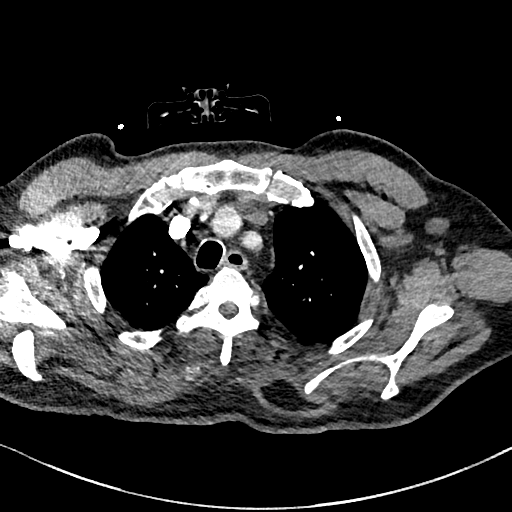
[im 172/207  lung]
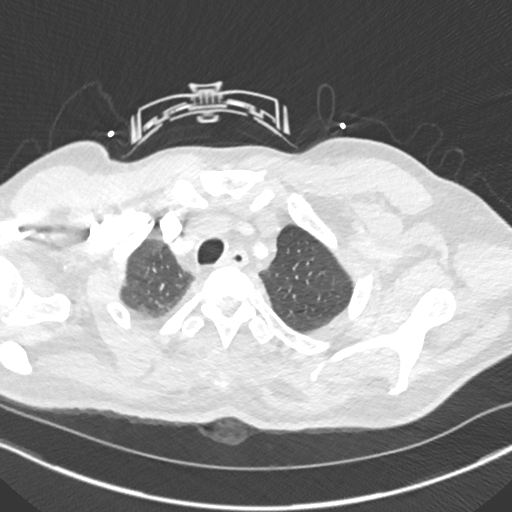
[im 184/207  mediastinal]
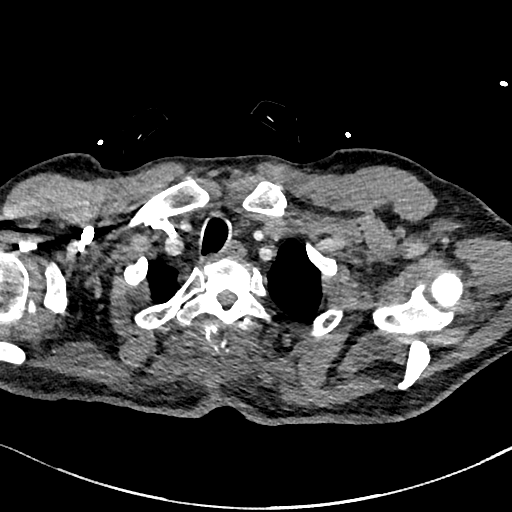
[im 195/207  lung]
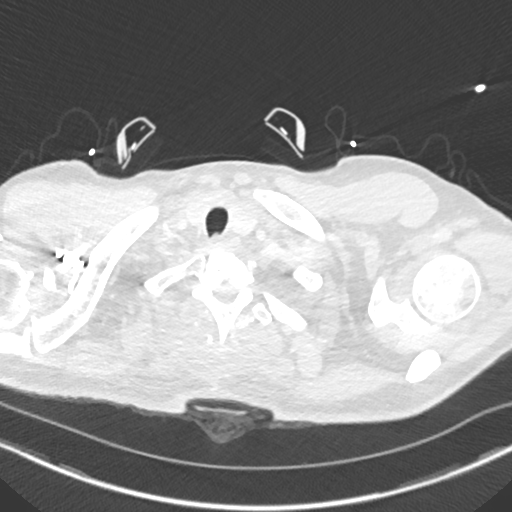

[Series 8: pe 2mm cor · coronal · 0.43mm/px · 1 of 150 slices shown]
[im 75/150  mediastinal]
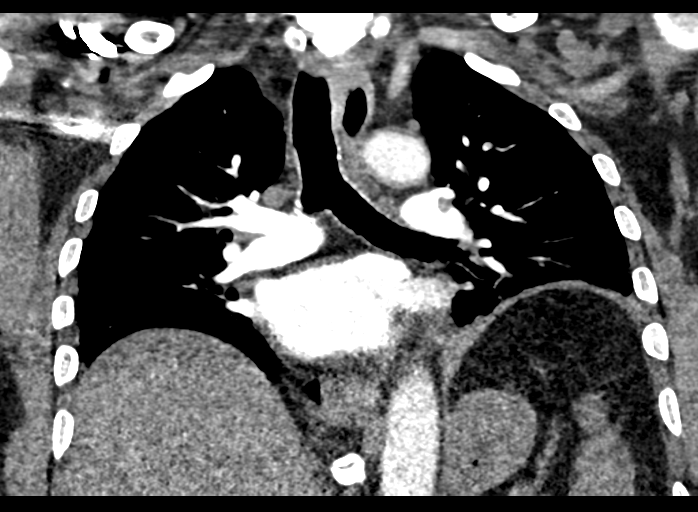

[18 of 36 positions shown; findings below may reference images not displayed]

FINDINGS: Cardiovascular: The study is positive for acute pulmonary
thromboembolism. There is linear low-density filling defect
extending from the main left pulmonary artery into left upper and
lower lobar branches. There is no evidence of right heart strain. No
evidence of aortic aneurysm or dissection.

Mediastinum/Nodes: No abnormal mediastinal adenopathy. Visualized
thyroid is normal. No pericardial effusion.

Lungs/Pleura: Tiny left pleural effusion. Dependent atelectasis
towards the lung bases. There is more confluent pulmonary opacity at
the left lung base over the left hemidiaphragm. Again, volume loss
is favored over pulmonary infarct.

Upper Abdomen: No acute abnormality.

Musculoskeletal: Fusion hardware is in place extending from the
cervical spine to T1. There is no vertebral compression deformity

Review of the MIP images confirms the above findings.
IMPRESSION: The study is positive for acute left pulmonary thromboembolism as
described. Critical Value/emergent results were called by telephone
at the time of interpretation on [DATE] at 02/14/2017 to Dr. SIMIN
UBIALI , who verbally acknowledged these results.

## 2017-11-20 ENCOUNTER — Ambulatory Visit: Payer: Medicaid Other | Attending: Internal Medicine | Admitting: Physician Assistant

## 2017-11-20 ENCOUNTER — Telehealth: Payer: Self-pay | Admitting: Pharmacist

## 2017-11-20 VITALS — BP 166/104 | HR 86 | Temp 98.1°F | Resp 16 | Wt 212.8 lb

## 2017-11-20 DIAGNOSIS — R220 Localized swelling, mass and lump, head: Secondary | ICD-10-CM | POA: Diagnosis present

## 2017-11-20 DIAGNOSIS — Z8673 Personal history of transient ischemic attack (TIA), and cerebral infarction without residual deficits: Secondary | ICD-10-CM | POA: Insufficient documentation

## 2017-11-20 DIAGNOSIS — I10 Essential (primary) hypertension: Secondary | ICD-10-CM | POA: Diagnosis not present

## 2017-11-20 DIAGNOSIS — Z91013 Allergy to seafood: Secondary | ICD-10-CM | POA: Insufficient documentation

## 2017-11-20 DIAGNOSIS — F32A Depression, unspecified: Secondary | ICD-10-CM

## 2017-11-20 DIAGNOSIS — E119 Type 2 diabetes mellitus without complications: Secondary | ICD-10-CM | POA: Insufficient documentation

## 2017-11-20 DIAGNOSIS — K047 Periapical abscess without sinus: Secondary | ICD-10-CM | POA: Insufficient documentation

## 2017-11-20 DIAGNOSIS — Z7901 Long term (current) use of anticoagulants: Secondary | ICD-10-CM | POA: Insufficient documentation

## 2017-11-20 DIAGNOSIS — F329 Major depressive disorder, single episode, unspecified: Secondary | ICD-10-CM | POA: Diagnosis not present

## 2017-11-20 DIAGNOSIS — Z79899 Other long term (current) drug therapy: Secondary | ICD-10-CM | POA: Insufficient documentation

## 2017-11-20 DIAGNOSIS — I2510 Atherosclerotic heart disease of native coronary artery without angina pectoris: Secondary | ICD-10-CM | POA: Insufficient documentation

## 2017-11-20 DIAGNOSIS — K0889 Other specified disorders of teeth and supporting structures: Secondary | ICD-10-CM | POA: Diagnosis present

## 2017-11-20 MED ORDER — DULOXETINE HCL 30 MG PO CPEP: 30.0000 mg | ORAL_CAPSULE | Freq: Two times a day (BID) | ORAL | 3 refills | Status: DC

## 2017-11-20 MED ORDER — IBUPROFEN 800 MG PO TABS: 800.0000 mg | ORAL_TABLET | Freq: Three times a day (TID) | ORAL | 0 refills | Status: DC | PRN

## 2017-11-20 MED ORDER — PENICILLIN V POTASSIUM 500 MG PO TABS: 500.0000 mg | ORAL_TABLET | Freq: Four times a day (QID) | ORAL | 0 refills | Status: DC

## 2017-11-20 MED FILL — DULoxetine HCL 30 MG CPEP: 30 | 30 days supply | Qty: 60 | Fill #0

## 2017-11-20 MED FILL — PENICILLIN VK 500 MG TABLET: 500 | 7 days supply | Qty: 30 | Fill #0

## 2017-11-20 NOTE — Telephone Encounter (Signed)
Received call from pharmacy regarding ibuprofen use with warfarin and clopidogrel. Discussed with provider, patient to use OTC acetaminophen as he is supposedly getting Norco tomorrow. Pharmacy aware.

## 2017-11-20 NOTE — Progress Notes (Signed)
Russell Carter, is a 62 y.o. male  ZOX:[id number]  WJX:[id number]  DOB - 27-Jun-1956  Subjective:  Chief Complaint and HPI: Russell Carter is a 62 y.o. male here today for L facial swelling and tooth pain that has been bothering him on and off for about 3 months.  Worse over the last week and L side of face started swelling.    He is also having some recurrent depression.  Denies SI/HI.  He does have passive thoughts of "wishing I wasn't here." he says he does not have any plan or intent to harm himself.  He lives with his brother who is supportive.  There are weapons in the home but they are all locked up and ammunition is separate.  He would like to see a psychiatrist.  He was previously taking cymbalta and says he was feeling much better while he was on this.  He ran out of RF.  BP has been high bc he is in pain with his mouth.  He denies CP/SOB/HA.     ROS:   Constitutional:  No f/c, No night sweats, No unexplained weight loss. EENT:  No vision changes, No blurry vision, No hearing changes. No other mouth, throat, or ear problems.  Respiratory: No cough, No SOB Cardiac: No CP, no palpitations GI:  No abd pain, No N/V/D. GU: No Urinary s/sx Musculoskeletal: No joint pain Neuro: No headache, no dizziness, no motor weakness.  Skin: No rash Endocrine:  No polydipsia. No polyuria.  Psych: Denies SI/HI  No problems updated.  ALLERGIES: Allergies  Allergen Reactions  . Shellfish Allergy Anaphylaxis    PAST MEDICAL HISTORY: Past Medical History:  Diagnosis Date  . Anginal pain (HCC)   . Arthritis   . Blood in stool   . CAD (coronary artery disease)    a. 2011: cath showing mod non-obstructive disease b. 02/2016: cath with 95% stenosis in the Mid RCA. DES placed.  . Cervical stenosis of spine   . Cocaine abuse (HCC)    history  . Depression   . Diabetes mellitus without complication (HCC)   . Gout, unspecified   . Headache   . HTN (hypertension)   . Hypercholesterolemia    . Hyperglycemia   . Migraines   . Myocardial infarction [facility]) 2008   drug cocaine and marijuana at time of mi none  since    . Pneumonia   . Renal insufficiency   . Stroke [facility])     MEDICATIONS AT HOME: Prior to Admission medications   Medication Sig Start Date End Date Taking? Authorizing Provider  allopurinol (ZYLOPRIM) 300 MG tablet Take 1 tablet (300 mg total) by mouth daily. 05/02/17   Quentin Angst, MD  amLODipine (NORVASC) 5 MG tablet Take 5 mg by mouth daily. 07/24/17   [provider]  CALCIUM-VITAMIN D PO Take 1 tablet by mouth daily.    [provider]  clopidogrel (PLAVIX) 75 MG tablet TAKE 1 TABLET DAILY BY MOUTH. 10/25/17   Hoy Register, MD  DULoxetine (CYMBALTA) 30 MG capsule Take 1 capsule (30 mg total) by mouth 2 (two) times daily. 11/20/17   Anders Simmonds, PA-C  ferrous sulfate 325 (65 FE) MG tablet Take 325 mg by mouth 3 (three) times daily with meals.    [provider]  gabapentin (NEURONTIN) 400 MG capsule Take 1 capsule (400 mg total) 2 (two) times daily by mouth. Patient not taking: Reported on 09/15/2017 09/04/17   Quentin Angst, MD  gabapentin (NEURONTIN)  600 MG tablet Take 600 mg by mouth 3 (three) times daily. 07/24/17   [provider]  hydrOXYzine (ATARAX/VISTARIL) 25 MG tablet Take 1 tablet (25 mg total) by mouth every 6 (six) hours as needed for anxiety. 07/27/17   Quentin Angst, MD  ibuprofen (ADVIL,MOTRIN) 800 MG tablet Take 1 tablet (800 mg total) by mouth every 8 (eight) hours as needed. 11/20/17   Anders Simmonds, PA-C  losartan (COZAAR) 100 MG tablet Take 1 tablet (100 mg total) by mouth daily. 10/02/17   Quentin Angst, MD  meclizine (ANTIVERT) 25 MG tablet Take 1 tablet (25 mg total) 3 (three) times daily as needed by mouth for dizziness. 09/04/17   Quentin Angst, MD  methocarbamol (ROBAXIN) 750 MG tablet Take 1 tablet (750 mg total) every 8 (eight) hours as needed by mouth for  muscle spasms. 09/04/17   Quentin Angst, MD  metoCLOPramide (REGLAN) 10 MG tablet Take 1 tablet (10 mg total) by mouth every 6 (six) hours as needed for nausea (or headache). 10/02/17   Quentin Angst, MD  nitroGLYCERIN (NITROSTAT) 0.4 MG SL tablet Place 1 tablet (0.4 mg total) under the tongue every 5 (five) minutes as needed for chest pain. 05/02/17   Quentin Angst, MD  omeprazole (PRILOSEC) 20 MG capsule Take 1 capsule (20 mg total) by mouth daily. 10/02/17   Quentin Angst, MD  OXYCODONE HCL PO Take 7.5 mg by mouth.    [provider]  pantoprazole (PROTONIX) 40 MG tablet Take 1 tablet (40 mg total) at bedtime by mouth. 09/04/17   Quentin Angst, MD  penicillin v potassium (VEETID) 500 MG tablet Take 1 tablet (500 mg total) by mouth 4 (four) times daily. 11/20/17   Anders Simmonds, PA-C  rosuvastatin (CRESTOR) 40 MG tablet Take 1 tablet (40 mg total) at bedtime by mouth. 09/04/17 12/03/17  Quentin Angst, MD  warfarin (COUMADIN) 5 MG tablet Take 1.5 tablets Mondays, Wednesdays, and Fridays. Take 1 tablet all other days. Patient taking differently: 5-7.5 mg. Take 7.5 tablets Mondays, Wednesdays, and Fridays. Take 5mg  tablet all other days. 07/24/17   Quentin Angst, MD  famotidine (PEPCID) 20 MG tablet Take 1 tablet (20 mg total) by mouth 2 (two) times daily. 12/04/11 12/30/11  Grant Fontana, PA-C  TACRINE HYDROCHLORIDE PO Take 25 mg by mouth daily.    12/30/11  [provider]     Objective:  EXAM:   Vitals:   11/20/17 1334  BP: (!) 166/104  Pulse: 86  Resp: 16  Temp: 98.1 F (36.7 C)  TempSrc: Oral  SpO2: 96%  Weight: 212 lb 12.8 oz (96.5 kg)    General appearance : A&OX3. NAD. Non-toxic-appearing HEENT: Atraumatic and Normocephalic.  PERRLA. EOM intact.  TM clear B. Mouth-MMM, post pharynx WNL w/o erythema, No PND.  L face mildly swollen upper jaw, L molar with TTP at gum base.  There is surrounding erythema at the  gum base and swelling at the gum.   Neck: supple, no JVD. No cervical lymphadenopathy. No thyromegaly Chest/Lungs:  Breathing-non-labored, Good air entry bilaterally, breath sounds normal without rales, rhonchi, or wheezing  CVS: S1 S2 regular, no murmurs, gallops, rubs  Extremities: Bilateral Lower Ext shows no edema, both legs are warm to touch with = pulse throughout Neurology:  CN II-XII grossly intact, Non focal.   Psych:  TP linear. J/I WNL. Normal speech. Appropriate eye contact and affect.  Skin:  No Rash  Data  Review Lab Results  Component Value Date   HGBA1C 6.1 09/18/2017   HGBA1C 4.9 05/01/2017   HGBA1C 6.2 (H) 01/21/2017     Assessment & Plan   1. Depression, unspecified depression type No acute safety issues.  Russell Carter is not in the office this afternoon.  I have spent more than face to face on self-care, provided resources for [facility], etc.   restart- DULoxetine (CYMBALTA) 30 MG capsule; Take 1 capsule (30 mg total) by mouth 2 (two) times daily.  Dispense: 60 capsule; Refill: 3(restart at once daily for the first week) - Ambulatory referral to Psychiatry  2. Abscessed tooth - penicillin v potassium (VEETID) 500 MG tablet; Take 1 tablet (500 mg total) by mouth 4 (four) times daily.  Dispense: 30 tablet; Refill: 0 - Ambulatory referral to Dentistry - ibuprofen (ADVIL,MOTRIN) 800 MG tablet; Take 1 tablet (800 mg total) by mouth every 8 (eight) hours as needed.  Dispense: 30 tablet; Refill: 0(use sparingly bc on blood thinner) also he gets pain meds filled tomorrow and can limit ibuprofen use.  (actually-pharmacy ended up holding this all together and not filling It due to blood thinners)  3.  htn-uncontrolled Check BP OOO and record and bring to next visit.  We have discussed target BP range and blood pressure goal. I have advised patient to check BP regularly and to call us back or report to clinic if the numbers are consistently higher than 140/90. We  discussed the importance of compliance with medical therapy and DASH diet recommended, consequences of uncontrolled hypertension discussed.    Patient have been counseled extensively about nutrition and exercise  Return in about 2 weeks (around 12/04/2017) for Dr Hyman Hopes and Jasmine-BP, depression and tooth abscess follow-up.  The patient was given clear instructions to go to ER or return to medical center if symptoms don't improve, worsen or new problems develop. The patient verbalized understanding. The patient was told to call to get lab results if they haven't heard anything in the next week.     Georgian Co, PA-C [facility] [place], [place] [phone]   11/20/2017, 1:51 PMPatient ID: Dmetrius Ambs, male   DOB: 1956-03-29, 62 y.o.   MRN: [id number]

## 2017-11-20 NOTE — Progress Notes (Signed)
Pt is in the office today neck swelling  Pt states he had a tooth abscess

## 2017-11-26 ENCOUNTER — Telehealth: Payer: Self-pay

## 2017-11-26 NOTE — Telephone Encounter (Signed)
I called Russell Carter. He reports that the procedure has already been canceled and he has already started the warfarin back. They have not rescheduled the procedure. He has appt with me on Thursday. Will coordinate Lovenox x 5 days with Dr. Hyman Hopes. Patient will call and let me know when his procedure will be scheduled for if he knows prior to seeing me on Thursday. He is aware he will need to be on Lovenox x 5 days leading up to the procedure.

## 2017-11-26 NOTE — Telephone Encounter (Addendum)
[facility] has been managing patient's Coumadin. 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 He will require a Lovenox bridge in order to hold his Coumadin for 5 days.  He will need his procedure moved since [facility] did not request clearance until 2 days before procedure. They also should not have advised patient to start holding his Coumadin already without clearance from [place]. In addition, he would have only been able to hold his Coumadin for 3 days based on what they told the patient, and he will need to hold for 5 days due to spinal injection.  Please contact [facility] to advise them of this. Message will also need to be routed to Viann Fish, PharmD with [facility] so that she can help coordinate Lovenox bridge for patient. Patient will need to be instructed to resume his Coumadin until this can be arranged.

## 2017-11-26 NOTE — Telephone Encounter (Signed)
Working in the [employer] today. See previous notes from earlier today. This note is regards to pt is past due for his f/u appt, pt last seen 12/2016 with Norma Fredrickson, NP, pt has seen the HTN clinic a couple of times, though per Lizabeth Leyden, NP pt does also need a f/u with Dr. Eden Emms, though this will not be for Pre-Op clearance. Pt is aware and agreeable of appt Dr. Eden Emms 12/02/17 @ 11:15. Pt aware this appt is due to his is past due and this is just his f/u appt. Pt thanked me for my help today.

## 2017-11-26 NOTE — Telephone Encounter (Signed)
Per Megan Supple, Pharm-D I called and lmtcb for Jessica surgery scheduler for Dr. Harkins with [facility]. I was calling per Pharm- D needing to inform of Megan Supple, Pharm D's recommendations in regards to holding Coumadin as well as needing Lovenox bridging. Per Pharm D I will also route this to Stacey Hammer with [facility] so that they may coordinate Lovenox bridging since the pt has his INR checked with the them.  

## 2017-11-26 NOTE — Telephone Encounter (Signed)
Per Margaretmary Dys, Pharm-D I called and lmtcb for [facility] surgery scheduler for Dr. Ollen Bowl with [facility]. I was calling per Pharm- D needing to inform of Margaretmary Dys, Pharm D's recommendations in regards to holding Coumadin as well as needing Lovenox bridging. Per Pharm D I will also route this to Viann Fish with [facility] so that they may coordinate Lovenox bridging since the pt has his INR checked with the them.

## 2017-11-26 NOTE — Telephone Encounter (Addendum)
Dr. Ollen Bowl office called back and has been informed per Margaretmary Dys, Pharm-D recommendations.  See below: Awilda Metro, [facility]      11/26/17 2:28 PM  Note    [facility] has been managing patient's Coumadin. 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 He will require a Lovenox bridge in order to hold his Coumadin for 5 days.  He will need his procedure moved since [facility] did not request clearance until 2 days before procedure. They also should not have advised patient to start holding his Coumadin already without clearance from [place]. In addition, he would have only been able to hold his Coumadin for 3 days based on what they told the patient, and he will need to hold for 5 days due to spinal injection.  Please contact [facility] to advise them of this. Message will also need to be routed to Viann Fish, PharmD with [facility] so that she can help coordinate Lovenox bridge for patient. Patient will need to be instructed to resume his Coumadin until this can be arranged.     I did advise per Pharm D pt will need to resume his coumadin until he is started on Lovenox bridging can be scheduled . I advised that I have also sent this to Viann Fish, Pharm-D with [facility] for the Lovenox bridging. Pt has appt with Viann Fish, Pharm-D on 11/28/17 for INR check. I will fax this over to Dr. Ollen Bowl office to update them as to the status.

## 2017-11-26 NOTE — Telephone Encounter (Signed)
   [facility] Pre-operative Risk Assessment    Request for surgical clearance:  1. What type of surgery is being performed? Epidural steroid injection (cervical)   2. When is this surgery scheduled? 11/28/17   3. What type of clearance is required (medical clearance vs. Pharmacy clearance to hold med vs. Both)? Pharmacy clearance  4. Are there any medications that need to be held prior to surgery and how long? Coumadin for 5 days prior [facility] stated patient has been holding med 1 day already)  5. Practice name and name of physician performing surgery? [facility], Dr. Clydell Hakim  6. What is your office phone and fax number? Phone: [phone] FAX: [phone]   7. Anesthesia type (None, local, MAC, general) ? N/A   Russell Carter Ingalls 11/26/2017, 2:21 PM  _________________________________________________________________   (provider comments below)

## 2017-11-26 NOTE — Telephone Encounter (Signed)
The patient is on coumadin for afib and history of PE. Recommendations for stopping coumadin with Lovenox bridging have been made by our pharmacist and will be addressed at the [facility] center, where his coumadin is followed.  I was reviewing his chart it looks like he has not been seen since March 2018. He needs follow up with our office. The injection that he is scheduled for is low risk and should not be held up for this appointment.   Berton Bon, AGNP-C [facility] 11/26/2017  4:26 PM

## 2017-11-27 NOTE — Progress Notes (Signed)
CARDIOLOGY OFFICE NOTE  Date:  12/02/2017    Russell Carter Date of Birth: 1956-04-15 Medical Record #[id number]  PCP:  Quentin Angst, MD  Cardiologist:  Townsend Roger    No chief complaint on file.   History of Present Illness: Russell Carter is a 62 y.o. male who presents today for F/U CAD and HTN. He has a history of CVA, PE and requires lovenox bridging. Most recently seen by pharmacist, HTN clinic and NP. May 2017 had DES to RCA normal EF  Subsequently required C3-T1 laminectomy and fusion. Previous cocaine use. Insurance issues and frequently runs Out of meds.    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 Anginal pain (HCC)   . Arthritis   . Blood in stool   . CAD (coronary artery disease)    a. 2011: cath showing mod non-obstructive disease b. 02/2016: cath with 95% stenosis in the Mid RCA. DES placed.  . Cervical stenosis of spine   . Cocaine abuse (HCC)    history  . Depression   . Diabetes mellitus without complication (HCC)   . Gout, unspecified   . Headache   . HTN (hypertension)   . Hypercholesterolemia   . Hyperglycemia   . Migraines   . Myocardial infarction [facility]) 2008   drug cocaine and marijuana at time of mi none  since    . Pneumonia   . Renal insufficiency   . Stroke [facility])     Past Surgical History:  Procedure Laterality Date  . ANTERIOR CERVICAL DECOMP/DISCECTOMY FUSION N/A 01/24/2017   Procedure: Cervical Four-Five,Cervical Five Six,Cervical Six-Seven Anterior cervical discectomy with fusion and plate with Cervical Three-Thoracic- One Laminectomy/Foraminotomy with  Cervical Three-Thoracic- One Fixation and fusion;  Surgeon: Loura Halt Ditty, MD;  Location: [facility] OR;  Service: Neurosurgery;  Laterality: N/A;  Part-1 anterior approach  . CARDIAC CATHETERIZATION  2008; ~ 2011   [place]-40%lad  . CARDIAC CATHETERIZATION N/A 03/16/2016   Procedure: Left Heart Cath and Coronary Angiography;  Surgeon: Marykay Lex, MD;  Location: [facility] INVASIVE CV LAB;  Service: Cardiovascular;  Laterality: N/A;  . CORONARY ANGIOPLASTY     STENTS  . EYE SURGERY Bilateral    "for double vision"  . INGUINAL HERNIA REPAIR  05/12/2012   Procedure: HERNIA REPAIR INGUINAL ADULT;  Surgeon: Liz Malady, MD;  Location: [facility];  Service: General;  Laterality: Right;  . INGUINAL HERNIA REPAIR Left 02/02/2003   Dr. Violeta Gelinas. repair with mesh. Same procedure done again in  05/22/2004   . INGUINAL HERNIA REPAIR Left ?date 2nd OR  . POSTERIOR CERVICAL FUSION/FORAMINOTOMY N/A 01/24/2017   Procedure: Cervical Three-Thoracic One-Laminectomy/Foraminotomy with Cervical Three-Thoracic One Fixation and fusion;  Surgeon: Loura Halt Ditty, MD;  Location: [facility] OR;  Service: Neurosurgery;  Laterality: N/A;  Part-2 Posterior approach  . RECONSTRUCT / STABILIZE DISTAL ULNA    . SHOULDER OPEN ROTATOR CUFF REPAIR Left 2010  . SHOULDER SURGERY Left    "injured on the job; had to cut small bone out"  . THUMB FUSION Right  Medications: Current Outpatient Medications  Medication Sig Dispense Refill  . allopurinol (ZYLOPRIM) 300 MG tablet Take 1 tablet (300 mg total) by mouth daily. 90 tablet 6  . amLODipine (NORVASC) 5 MG tablet Take 5 mg by mouth daily.  9  . CALCIUM-VITAMIN D PO Take 1 tablet by mouth daily.    . clopidogrel (PLAVIX) 75 MG tablet TAKE 1 TABLET DAILY BY MOUTH. 30 tablet 0  . DULoxetine (CYMBALTA) 30 MG capsule Take 1 capsule (30 mg total) by mouth 2 (two) times daily. 60 capsule 3  . enoxaparin (LOVENOX) 100 MG/ML injection Inject 1 mL (100 mg total) into  the skin every 12 (twelve) hours. 30 Syringe 0  . ferrous sulfate 325 (65 FE) MG tablet Take 325 mg by mouth 3 (three) times daily with meals.    . gabapentin (NEURONTIN) 400 MG capsule Take 1 capsule (400 mg total) 2 (two) times daily by mouth. 180 capsule 3  . gabapentin (NEURONTIN) 600 MG tablet Take 600 mg by mouth 3 (three) times daily.  0  . hydrOXYzine (ATARAX/VISTARIL) 25 MG tablet Take 1 tablet (25 mg total) by mouth every 6 (six) hours as needed for anxiety. 30 tablet 0  . ibuprofen (ADVIL,MOTRIN) 800 MG tablet Take 1 tablet (800 mg total) by mouth every 8 (eight) hours as needed. 30 tablet 0  . losartan (COZAAR) 100 MG tablet Take 1 tablet (100 mg total) by mouth daily. 30 tablet 2  . meclizine (ANTIVERT) 25 MG tablet Take 1 tablet (25 mg total) 3 (three) times daily as needed by mouth for dizziness. 30 tablet 1  . methocarbamol (ROBAXIN) 750 MG tablet Take 1 tablet (750 mg total) every 8 (eight) hours as needed by mouth for muscle spasms. 90 tablet 1  . metoCLOPramide (REGLAN) 10 MG tablet Take 1 tablet (10 mg total) by mouth every 6 (six) hours as needed for nausea (or headache). 10 tablet 0  . nitroGLYCERIN (NITROSTAT) 0.4 MG SL tablet Place 1 tablet (0.4 mg total) under the tongue every 5 (five) minutes as needed for chest pain. 25 tablet 4  . OXYCODONE HCL PO Take 7.5 mg by mouth.    . pantoprazole (PROTONIX) 40 MG tablet Take 1 tablet (40 mg total) at bedtime by mouth. 30 tablet 1  . penicillin v potassium (VEETID) 500 MG tablet Take 1 tablet (500 mg total) by mouth 4 (four) times daily. 30 tablet 0  . rosuvastatin (CRESTOR) 40 MG tablet Take 1 tablet (40 mg total) at bedtime by mouth. 30 tablet 2   No current facility-administered medications for this visit.     Allergies: Allergies  Allergen Reactions  . Shellfish Allergy Anaphylaxis    Social History: The patient  reports that  has never smoked. he has never used smokeless tobacco. He reports that he drinks alcohol. He  reports that he uses drugs. Drugs: "Crack" cocaine, Marijuana, and Cocaine.   Family History: The patient's family history includes Cancer in his brother; Heart attack in his brother, father, and mother.   Review of Systems: Please see the history of present illness.   Otherwise, the review of systems is positive for none.   All other systems are reviewed and negative.   Physical Exam: VS:  BP 124/76   Pulse 77   Ht 5\' 11"  (1.803 m)   Wt 211 lb 3.2 oz (95.8 kg)   SpO2 99%   BMI 29.46 kg/m  .  BMI Body mass index is 29.46 kg/m.  Wt Readings  from Last 3 Encounters:  12/02/17 211 lb 3.2 oz (95.8 kg)  11/20/17 212 lb 12.8 oz (96.5 kg)  09/18/17 196 lb (88.9 kg)   BP 124/76   Pulse 77   Ht 5\' 11"  (1.803 m)   Wt 211 lb 3.2 oz (95.8 kg)   SpO2 99%   BMI 29.46 kg/m  Affect appropriate Overweight black male  HEENT: normal Neck post cervicale laminectomy and fusion  JVP normal no bruits no thyromegaly Lungs clear with no wheezing and good diaphragmatic motion Heart:  S1/S2 no murmur, no rub, gallop or click PMI normal Abdomen: benighn, BS positve, no tenderness, no AAA no bruit.  No HSM or HJR Distal pulses intact with no bruits No edema Neuro unable to move left arm over head  Skin warm and dry No muscular weakness    LABORATORY DATA:  EKG:   09/25/17 SR rate 78 normal .  Lab Results  Component Value Date   WBC 5.0 09/14/2017   HGB 14.0 09/14/2017   HCT 42.5 09/14/2017   PLT 209 09/14/2017   GLUCOSE 105 (H) 09/14/2017   CHOL 191 12/26/2016   TRIG 285 (H) 12/26/2016   HDL 42 12/26/2016   LDLCALC 92 12/26/2016   ALT 22 09/14/2017   AST 17 09/14/2017   NA 138 09/14/2017   K 3.7 09/14/2017   CL 110 09/14/2017   CREATININE 1.16 09/14/2017   BUN 14 09/14/2017   CO2 20 (L) 09/14/2017   TSH 2.215 03/13/2016   INR 2.5 11/28/2017   HGBA1C 6.1 09/18/2017   MICROALBUR 150 05/02/2017    BNP (last 3 results) No results for input(s): BNP in the last 8760  hours.  ProBNP (last 3 results) No results for input(s): PROBNP in the last 8760 hours.   Other Studies Reviewed Today: Procedures   Left Heart Cath and Coronary Angiography 02/2016  Conclusion   1. Mid RCA lesion, 95% stenosed. Post intervention with a Promus DES stent 2.5 mm x 20 mm (2.8 mm), there is a 0% residual stenosis. 2. The left ventricular systolic function is normal. 3. Otherwise minimal CAD   Severe single vessel disease involving the mid RCA treated with a DES stent. Due to the length of lesion and initial potential size of the vessel I chose to use a DES stent in this patient with diabetes.  He does have severe cervical spine disease that will likely need to be treated. Based on your pain trial data, we can consider potentially holding Brilinta for possible surgery in 6 months. Best option would be to bridge with Aggrastat, however if bleeding this would be too high, I think it 6 months he would be fine stopping Brilinta for 5-7 days preoperatively.  Plan:  Transfer to 6 central post procedure unit.  TR band removal per protocol.  On aspirin plus Brilinta for a year, but okay to potentially suspend Brilinta for 5-7 days preoperatively after roughly 6 months.  Continue aggressive risk modification.  Counseling reference absence of cocaine use.      Assessment/Plan:  1.  CAD with DES stent to RCA 03/16/16  for 95% blockage.  Stable no angina   2.  Essential hypertension- tends to be fine when compliant with meds    5.  Cervical spinal stenosis  post laminectomy and fusion with lovenox bridging 5 days before spinal injection 12/05/17  6. HLD:  Continue high dose crestor  Lab Results  Component Value Date   LDLCALC 92 12/26/2016   7. DM:  last A1c 09/18/17 6.1 low carb diet f/u primary   Russell Carter

## 2017-11-28 ENCOUNTER — Ambulatory Visit: Payer: Medicaid Other | Attending: Internal Medicine | Admitting: Pharmacist

## 2017-11-28 DIAGNOSIS — I2782 Chronic pulmonary embolism: Secondary | ICD-10-CM

## 2017-11-28 DIAGNOSIS — Z7901 Long term (current) use of anticoagulants: Secondary | ICD-10-CM | POA: Diagnosis not present

## 2017-11-28 DIAGNOSIS — I4891 Unspecified atrial fibrillation: Secondary | ICD-10-CM | POA: Diagnosis not present

## 2017-11-28 LAB — POCT INR: INR: 2.5

## 2017-11-28 MED ORDER — ENOXAPARIN SODIUM 100 MG/ML ~~LOC~~ SOLN: 100.0000 mg | Freq: Two times a day (BID) | SUBCUTANEOUS | 0 refills | Status: DC

## 2017-11-28 MED FILL — ALLOPURINOL 300 MG TABS: 300 | 30 days supply | Qty: 30 | Fill #3

## 2017-11-28 NOTE — Progress Notes (Signed)
    Pharmacy Anticoagulation Clinic  Subjective: Patient presents today for INR monitoring. Anticoagulation indication is atrial fibrillation/PE.   Current dose of warfarin: 5 mg daily, 7.5 mg on Mondays  Adherence to warfarin: held a dose for procedure but procedure was canceled. Signs/symptoms of bleeding: denies Recent changes in diet: denies Recent changes in medications: denies Upcoming procedures that may impact anticoagulation: spinal steroid injection 12/05/17   Objective: Today's INR = 2.5  Lab Results  Component Value Date   INR 2.5 11/28/2017   INR 2.4 11/14/2017   INR 4.0 11/06/2017     Assessment and Plan: Anticoagulation: Patient is Therapeutic based on patient's INR of 2.5 and patient's INR goal of 2-3. Patient needs to be off of warfarin x 5 days prior to procedure and due to high thrombotic risk, will need Lovenox bridge. Discussed with Dr. Hyman Hopes. Will Stop current warfarin dose 11/30/17 and on that day start Lovenox 1mg /kg BID. Patient has already been instructed when to hold the Lovenox by the Spinal doctor (was told to stop Wednesday) and he is aware that once he is cleared to restart anticoagulation (the spinal doctor should tell him when he is ok to restart based on the complexity of the procedure), he will have to be on both warfarin and Lovenox until his INR is therapeutic again. Patient is comfortable with Lovenox injections as he had had them before.   Patient verbalized understanding and was provided with written instructions. Next INR check planned for 12/04/17 during visit with PCP prior to spinal injection.

## 2017-12-02 ENCOUNTER — Ambulatory Visit: Payer: Medicaid Other | Admitting: Cardiovascular Disease

## 2017-12-02 ENCOUNTER — Encounter: Payer: Self-pay | Admitting: Cardiovascular Disease

## 2017-12-02 VITALS — BP 124/76 | HR 77 | Ht 71.0 in | Wt 211.2 lb

## 2017-12-02 DIAGNOSIS — I2510 Atherosclerotic heart disease of native coronary artery without angina pectoris: Secondary | ICD-10-CM | POA: Diagnosis not present

## 2017-12-02 DIAGNOSIS — I10 Essential (primary) hypertension: Secondary | ICD-10-CM | POA: Diagnosis not present

## 2017-12-02 NOTE — Patient Instructions (Signed)

## 2017-12-04 ENCOUNTER — Ambulatory Visit: Payer: Medicaid Other | Attending: Internal Medicine | Admitting: Internal Medicine

## 2017-12-04 ENCOUNTER — Encounter: Payer: Self-pay | Admitting: Pharmacist

## 2017-12-04 ENCOUNTER — Encounter: Payer: Self-pay | Admitting: Internal Medicine

## 2017-12-04 ENCOUNTER — Other Ambulatory Visit: Payer: Self-pay

## 2017-12-04 ENCOUNTER — Ambulatory Visit: Payer: Medicaid Other | Admitting: Pharmacist

## 2017-12-04 VITALS — BP 143/100 | HR 81 | Temp 97.8°F | Resp 16 | Wt 202.6 lb

## 2017-12-04 DIAGNOSIS — R42 Dizziness and giddiness: Secondary | ICD-10-CM | POA: Insufficient documentation

## 2017-12-04 DIAGNOSIS — K219 Gastro-esophageal reflux disease without esophagitis: Secondary | ICD-10-CM | POA: Diagnosis not present

## 2017-12-04 DIAGNOSIS — I129 Hypertensive chronic kidney disease with stage 1 through stage 4 chronic kidney disease, or unspecified chronic kidney disease: Secondary | ICD-10-CM | POA: Insufficient documentation

## 2017-12-04 DIAGNOSIS — E119 Type 2 diabetes mellitus without complications: Secondary | ICD-10-CM | POA: Diagnosis not present

## 2017-12-04 DIAGNOSIS — Z7902 Long term (current) use of antithrombotics/antiplatelets: Secondary | ICD-10-CM | POA: Diagnosis not present

## 2017-12-04 DIAGNOSIS — E1122 Type 2 diabetes mellitus with diabetic chronic kidney disease: Secondary | ICD-10-CM | POA: Insufficient documentation

## 2017-12-04 DIAGNOSIS — I25110 Atherosclerotic heart disease of native coronary artery with unstable angina pectoris: Secondary | ICD-10-CM | POA: Insufficient documentation

## 2017-12-04 DIAGNOSIS — E785 Hyperlipidemia, unspecified: Secondary | ICD-10-CM | POA: Insufficient documentation

## 2017-12-04 DIAGNOSIS — I2782 Chronic pulmonary embolism: Secondary | ICD-10-CM

## 2017-12-04 DIAGNOSIS — Z09 Encounter for follow-up examination after completed treatment for conditions other than malignant neoplasm: Secondary | ICD-10-CM | POA: Diagnosis not present

## 2017-12-04 DIAGNOSIS — M109 Gout, unspecified: Secondary | ICD-10-CM | POA: Diagnosis not present

## 2017-12-04 DIAGNOSIS — I10 Essential (primary) hypertension: Secondary | ICD-10-CM

## 2017-12-04 DIAGNOSIS — R519 Headache, unspecified: Secondary | ICD-10-CM

## 2017-12-04 DIAGNOSIS — N183 Chronic kidney disease, stage 3 (moderate): Secondary | ICD-10-CM | POA: Diagnosis not present

## 2017-12-04 DIAGNOSIS — Z8673 Personal history of transient ischemic attack (TIA), and cerebral infarction without residual deficits: Secondary | ICD-10-CM | POA: Insufficient documentation

## 2017-12-04 DIAGNOSIS — I442 Atrioventricular block, complete: Secondary | ICD-10-CM | POA: Diagnosis not present

## 2017-12-04 DIAGNOSIS — E7800 Pure hypercholesterolemia, unspecified: Secondary | ICD-10-CM | POA: Diagnosis not present

## 2017-12-04 DIAGNOSIS — R51 Headache: Secondary | ICD-10-CM | POA: Diagnosis not present

## 2017-12-04 DIAGNOSIS — F329 Major depressive disorder, single episode, unspecified: Secondary | ICD-10-CM | POA: Diagnosis not present

## 2017-12-04 DIAGNOSIS — G8918 Other acute postprocedural pain: Secondary | ICD-10-CM | POA: Insufficient documentation

## 2017-12-04 DIAGNOSIS — F330 Major depressive disorder, recurrent, mild: Secondary | ICD-10-CM | POA: Insufficient documentation

## 2017-12-04 DIAGNOSIS — Z79899 Other long term (current) drug therapy: Secondary | ICD-10-CM | POA: Diagnosis not present

## 2017-12-04 DIAGNOSIS — M79675 Pain in left toe(s): Secondary | ICD-10-CM | POA: Diagnosis not present

## 2017-12-04 DIAGNOSIS — I252 Old myocardial infarction: Secondary | ICD-10-CM | POA: Diagnosis not present

## 2017-12-04 DIAGNOSIS — M4802 Spinal stenosis, cervical region: Secondary | ICD-10-CM | POA: Diagnosis not present

## 2017-12-04 DIAGNOSIS — F32A Depression, unspecified: Secondary | ICD-10-CM

## 2017-12-04 LAB — GLUCOSE, POCT (MANUAL RESULT ENTRY): POC GLUCOSE: 94 mg/dL (ref 70–99)

## 2017-12-04 LAB — POCT INR: INR: 1.3

## 2017-12-04 MED ORDER — MECLIZINE HCL 25 MG PO TABS: 25.0000 mg | ORAL_TABLET | Freq: Three times a day (TID) | ORAL | 1 refills | Status: DC | PRN

## 2017-12-04 MED ORDER — PANTOPRAZOLE SODIUM 40 MG PO TBEC: 40.0000 mg | DELAYED_RELEASE_TABLET | Freq: Every day | ORAL | 1 refills | Status: DC

## 2017-12-04 MED FILL — PANTOPRAZOLE SOD DR 40 MG T: 40 | 30 days supply | Qty: 30 | Fill #0

## 2017-12-04 MED FILL — MECLIZINE 25 MG TABLET: 25 | 10 days supply | Qty: 30 | Fill #0

## 2017-12-04 NOTE — Progress Notes (Signed)
Subjective:  Patient ID: Russell Carter, male    DOB: 10/28/1955  Age: 62 y.o. MRN: [id number]  CC: Follow-up   HPI Russell Carter is 62 y/o male with medical hx of HTN, CAD, GERD, DMII (A1c- 6.1, 09/14/17), CKD stage 3 (Cr-1.16, 09/14/17), HLD, Depression, and Gout. He presents today for 2 week f/u on tooth abscess, depression, and HTN.    Depression: Patient recently seen in clinic for depression on 11/20/17. Patient was started on Cymbalta/Duloxetine 30 mg, twice a day. Patient reports improvement. Denies lack of energy, loss if interest, or seclusion from others. Denies thoughts of self-harm, SI, or HI. Patient is pending f/u with [facility] mental health.   Tooth Abscess: Patient seen in clinic 11/20/17 for tooth abscess. Reports resolution after completion of antibiotics provided. He is awaiting call from dentistry referral placed on last visit.   HTN: Patient reports adherent with medication regimen and low salt diet. Denies chest pain, sob, dyspnea on exertion, palpitations, generalized weakness/fatigue, or peripheral edema. Reports intermittent dizziness that is controlled with meclizine. Recently seen in cardiology 12/02/17 by Dr. Eden Emms; no changes with medication regimen. He continues to be managed by cardiology.   L Great Toe Pain: Patient c/o L great toe pain for 4 days. He states that half of his nail has fallen off. Denies swelling, redness, bleeding, or purulent drainage. Denies reduced mobility of the toe. He has been applying neosporin to his toe and covering the nail with a Band-Aid.   Past Medical History:  Diagnosis Date  . Anginal pain (HCC)   . Arthritis   . Blood in stool   . CAD (coronary artery disease)    a. 2011: cath showing mod non-obstructive disease b. 02/2016: cath with 95% stenosis in the Mid RCA. DES placed.  . Cervical stenosis of spine   . Cocaine abuse (HCC)    history  . Depression   . Diabetes mellitus without complication (HCC)   . Gout, unspecified     . Headache   . HTN (hypertension)   . Hypercholesterolemia   . Hyperglycemia   . Migraines   . Myocardial infarction [facility]) 2008   drug cocaine and marijuana at time of mi none  since    . Pneumonia   . Renal insufficiency   . Stroke [facility])    Past Surgical History:  Procedure Laterality Date  . ANTERIOR CERVICAL DECOMP/DISCECTOMY FUSION N/A 01/24/2017   Procedure: Cervical Four-Five,Cervical Five Six,Cervical Six-Seven Anterior cervical discectomy with fusion and plate with Cervical Three-Thoracic- One Laminectomy/Foraminotomy with Cervical Three-Thoracic- One Fixation and fusion;  Surgeon: Loura Halt Ditty, MD;  Location: [facility] OR;  Service: Neurosurgery;  Laterality: N/A;  Part-1 anterior approach  . CARDIAC CATHETERIZATION  2008; ~ 2011   [place]-40%lad  . CARDIAC CATHETERIZATION N/A 03/16/2016   Procedure: Left Heart Cath and Coronary Angiography;  Surgeon: Marykay Lex, MD;  Location: [facility] INVASIVE CV LAB;  Service: Cardiovascular;  Laterality: N/A;  . CORONARY ANGIOPLASTY     STENTS  . EYE SURGERY Bilateral    "for double vision"  . INGUINAL HERNIA REPAIR  05/12/2012   Procedure: HERNIA REPAIR INGUINAL ADULT;  Surgeon: Liz Malady, MD;  Location: [facility];  Service: General;  Laterality: Right;  . INGUINAL HERNIA REPAIR Left 02/02/2003   Dr. Violeta Gelinas. repair with mesh. Same procedure done again in  05/22/2004   . INGUINAL HERNIA REPAIR Left ?date 2nd OR  . POSTERIOR CERVICAL FUSION/FORAMINOTOMY N/A 01/24/2017   Procedure: Cervical Three-Thoracic  One-Laminectomy/Foraminotomy with Cervical Three-Thoracic One Fixation and fusion;  Surgeon: Loura Halt Ditty, MD;  Location: [facility] OR;  Service: Neurosurgery;  Laterality: N/A;  Part-2 Posterior approach  . RECONSTRUCT / STABILIZE DISTAL ULNA    . SHOULDER OPEN ROTATOR CUFF REPAIR Left 2010  . SHOULDER SURGERY Left    "injured on the job; had to cut small bone out"  . THUMB FUSION Right    Patient Active  Problem List   Diagnosis Date Noted  . Dental caries 09/18/2017  . Syncope and collapse 09/04/2017  . Left arm weakness 08/07/2017  . Numbness and tingling in both hands 07/31/2017  . Gastroesophageal reflux disease without esophagitis 07/31/2017  . Moderate episode of recurrent major depressive disorder (HCC) 07/31/2017  . 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 Chronic pain disorder 06/05/2017  . Supratherapeutic INR 04/27/2017  . Gastrointestinal hemorrhage with melena   . Blood loss anemia 04/25/2017  . Pulmonary embolism (HCC) 02/14/2017  . Fatigue associated with anemia   . Post-operative pain   . Radicular pain   . Diabetes mellitus type 2 in nonobese (HCC)   . Surgery, elective   . Postoperative pain   . Leukocytosis   . Thrombocytopenia (HCC)   . History of myocardial infarction   . Cocaine abuse (HCC)   . Depression   . Type 2 diabetes mellitus with hyperglycemia, without long-term current use of insulin (HCC)   . Stage 3 chronic kidney disease (HCC)   . Benign essential HTN   . History of CVA (cerebrovascular accident)   . Prediabetes   . Cervical spondylosis with radiculopathy 01/24/2017  . Dyslipidemia 03/15/2016  . Unstable angina (HCC) - chest pain with complete heart block & chest pain 03/15/2016  . Complete heart block (HCC) 03/14/2016  . Cervical spinal stenosis 03/14/2016  . Chest pain with moderate risk of acute coronary syndrome 03/13/2016  . CAD-50% LAD 2011 12/14/2014  . Gout 11/16/2014  . Acute intractable headache 07/15/2013  . Polysubstance abuse (HCC) 01/15/2013  . DIZZINESS 06/05/2010  . Essential hypertension 05/18/2009  . Acute myocardial infarction (HCC) 05/18/2009  . MUSCLE SPASM 05/18/2009   Social History   Socioeconomic History  . Marital status: Widowed    Spouse name: Not on file  . Number of children: Not on file  . Years of education: Not on file  . Highest education level: Not on file  Social  Needs  . Financial resource strain: Not on file  . Food insecurity - worry: Not on file  . Food insecurity - inability: Not on file  . Transportation needs - medical: Not on file  . Transportation needs - non-medical: Not on file  Occupational History  . Not on file  Tobacco Use  . Smoking status: Never Smoker  . Smokeless tobacco: Never Used  Substance and Sexual Activity  . Alcohol use: Yes    Comment: 16 oz beer every 2-3 days  . Drug use: Yes    Types: "Crack" cocaine, Marijuana, Cocaine    Comment: 03/14/2016 "quit 2008"  . Sexual activity: Not Currently  Other Topics Concern  . Not on file  Social History Narrative   Lives in [place] with his wife and son.    Outpatient Medications Prior to Visit  Medication Sig Dispense Refill  . allopurinol (ZYLOPRIM) 300 MG tablet Take 1 tablet (300 mg total) by mouth daily. 90 tablet 6  . CALCIUM-VITAMIN D PO Take 1 tablet by mouth daily.    Marland Kitchen  DULoxetine (CYMBALTA) 30 MG capsule Take 1 capsule (30 mg total) by mouth 2 (two) times daily. 60 capsule 3  . enoxaparin (LOVENOX) 100 MG/ML injection Inject 1 mL (100 mg total) into the skin every 12 (twelve) hours. 30 Syringe 0  . ferrous sulfate 325 (65 FE) MG tablet Take 325 mg by mouth 3 (three) times daily with meals.    . gabapentin (NEURONTIN) 400 MG capsule Take 1 capsule (400 mg total) 2 (two) times daily by mouth. 180 capsule 3  . gabapentin (NEURONTIN) 600 MG tablet Take 600 mg by mouth 3 (three) times daily.  0  . hydrOXYzine (ATARAX/VISTARIL) 25 MG tablet Take 1 tablet (25 mg total) by mouth every 6 (six) hours as needed for anxiety. 30 tablet 0  . losartan (COZAAR) 100 MG tablet Take 1 tablet (100 mg total) by mouth daily. 30 tablet 2  . methocarbamol (ROBAXIN) 750 MG tablet Take 1 tablet (750 mg total) every 8 (eight) hours as needed by mouth for muscle spasms. 90 tablet 1  . metoCLOPramide (REGLAN) 10 MG tablet Take 1 tablet (10 mg total) by mouth every 6 (six) hours as needed  for nausea (or headache). 10 tablet 0  . nitroGLYCERIN (NITROSTAT) 0.4 MG SL tablet Place 1 tablet (0.4 mg total) under the tongue every 5 (five) minutes as needed for chest pain. 25 tablet 4  . penicillin v potassium (VEETID) 500 MG tablet Take 1 tablet (500 mg total) by mouth 4 (four) times daily. 30 tablet 0  . meclizine (ANTIVERT) 25 MG tablet Take 1 tablet (25 mg total) 3 (three) times daily as needed by mouth for dizziness. 30 tablet 1  . pantoprazole (PROTONIX) 40 MG tablet Take 1 tablet (40 mg total) at bedtime by mouth. 30 tablet 1  . amLODipine (NORVASC) 5 MG tablet Take 5 mg by mouth daily.  9  . ibuprofen (ADVIL,MOTRIN) 800 MG tablet Take 1 tablet (800 mg total) by mouth every 8 (eight) hours as needed. (Patient not taking: Reported on 12/04/2017) 30 tablet 0  . OXYCODONE HCL PO Take 7.5 mg by mouth.    . rosuvastatin (CRESTOR) 40 MG tablet Take 1 tablet (40 mg total) at bedtime by mouth. 30 tablet 2  . clopidogrel (PLAVIX) 75 MG tablet TAKE 1 TABLET DAILY BY MOUTH. (Patient not taking: Reported on 12/04/2017) 30 tablet 0   No facility-administered medications prior to visit.    Allergies  Allergen Reactions  . Shellfish Allergy Anaphylaxis   ROS Review of Systems  Constitutional: Negative for activity change, appetite change, chills, fatigue, fever and unexpected weight change.  Eyes: Negative for visual disturbance.  Respiratory: Negative for chest tightness and shortness of breath.   Cardiovascular: Negative for chest pain, palpitations and leg swelling.  Gastrointestinal: Negative for abdominal distention, abdominal pain, constipation, diarrhea, nausea and vomiting.  Musculoskeletal: Positive for neck pain. Negative for neck stiffness. Myalgias: chronic pain, no change in characteristics.  Skin: Negative for color change, rash and wound.  Neurological: Positive for dizziness (intermittently). Negative for syncope, weakness, light-headedness and headaches.    Psychiatric/Behavioral: Negative for agitation, behavioral problems, decreased concentration, dysphoric mood, self-injury, sleep disturbance and suicidal ideas.  All other systems reviewed and are negative.  Objective:  BP (!) 143/100 (BP Location: Right Arm, Patient Position: Sitting, Cuff Size: Large)   Pulse 81   Temp 97.8 F (36.6 C) (Oral)   Resp 16   Wt 202 lb 9.6 oz (91.9 kg)   SpO2 98%   BMI 28.26  kg/m   BP/Weight 12/04/2017 12/02/2017 11/20/2017  Systolic BP 143 124 166  Diastolic BP 100 76 104  Wt. (Lbs) 202.6 211.2 212.8  BMI 28.26 29.46 29.68   Depression screen [facility] 2/9 11/20/2017 09/18/2017 09/04/2017 06/20/2017 06/05/2017  Decreased Interest 3 3 3  0 1  Down, Depressed, Hopeless 3 3 3  0 3  PHQ - 2 Score 6 6 6  0 4  Altered sleeping 3 3 3 2  -  Tired, decreased energy 3 3 3 3  -  Change in appetite 3 3 3  0 -  Feeling bad or failure about yourself  0 3 0 0 -  Trouble concentrating 0 3 0 0 -  Moving slowly or fidgety/restless 0 - 0 0 -  Suicidal thoughts 1 - 0 0 -  PHQ-9 Score 16 21 15 5  -  Difficult doing work/chores - - - - -   GAD 7 : Generalized Anxiety Score 11/20/2017 09/18/2017 09/04/2017 06/20/2017  Nervous, Anxious, on Edge 3 3 0 0  Control/stop worrying 3 3 0 0  Worry too much - different things 3 3 0 0  Trouble relaxing 3 3 3 2   Restless 3 3 0 0  Easily annoyed or irritable 3 3 0 0  Afraid - awful might happen 0 3 0 0  Total GAD 7 Score 18 21 3 2    Physical Exam  Constitutional: He is oriented to person, place, and time. He appears well-developed and well-nourished.  HENT:  Head: Normocephalic and atraumatic.  Eyes: Conjunctivae and EOM are normal.  Neck: Normal range of motion. Neck supple. No JVD present. Carotid bruit is not present. No neck rigidity. Normal range of motion present.  Cardiovascular: Normal heart sounds. Exam reveals no gallop and no friction rub.  No murmur heard. Pulses:      Radial pulses are 2+ on the right side, and 2+ on the  left side.  Negative for peripheral edema  Pulmonary/Chest: Effort normal and breath sounds normal. No respiratory distress. He exhibits no tenderness.  Abdominal: Soft. There is no tenderness.  Musculoskeletal: Normal range of motion. He exhibits tenderness (at the L great toe).  Noted removal of nail to L great toe. No open wound present or loss of skin integrity. No signs of infection/inflammation.   Neurological: He is alert and oriented to person, place, and time.  Skin: Skin is warm, dry and intact.  Psychiatric: He has a normal mood and affect. His speech is normal and behavior is normal. Judgment and thought content normal.   Assessment & Plan:   1. Depression, unspecified depression type, controlled - Patient well controlled with Cymbalta at this time. He is pending mental health counseling at [facility].  - F/u in 3 months.   2. Essential hypertension, uncontrolled - In clinic BP 143/100. Patient states he has not taken his BP medications for this morning. Currently asymptomatic.  - Recently seen by Cardiology, Dr. Eden Emms 12/02/17; no changes to medication regimen. He continues to be managed by Cardiology.  - Instructed to continue medication regimen. Instructed to maintain low salt, low carb, and low sugar diet.  - F/u in 3 months.  3. Acute intractable headache, unspecified headache type, controlled - Patient well controlled with Meclizine. Continue medication regimen.  - meclizine (ANTIVERT) 25 MG tablet; Take 1 tablet (25 mg total) by mouth 3 (three) times daily as needed for dizziness.  Dispense: 30 tablet; Refill: 1  4. Gastroesophageal reflux disease without esophagitis, controlled - Patient well controlled with medication regimen.  -  Continue medication regimen.  - pantoprazole (PROTONIX) 40 MG tablet; Take 1 tablet (40 mg total) by mouth at bedtime.  Dispense: 30 tablet; Refill: 1  Meds ordered this encounter  Medications  . meclizine (ANTIVERT) 25 MG tablet    Sig:  Take 1 tablet (25 mg total) by mouth 3 (three) times daily as needed for dizziness.    Dispense:  30 tablet    Refill:  1  . pantoprazole (PROTONIX) 40 MG tablet    Sig: Take 1 tablet (40 mg total) by mouth at bedtime.    Dispense:  30 tablet    Refill:  1    Follow-up: Return in about 3 months (around 03/03/2018).   Krystle H. Marice Potter, AGDNP-student   Evaluation and management procedures were performed by me with DNP Student in attendance, note written by DNP student under my supervision and collaboration. I have reviewed the note and I agree with the management and plan.   Jeanann Lewandowsky, MD, MHA, CPE, [facility] and [facility] [place], [place] [phone]   12/05/2017, 2:24 PM

## 2017-12-04 NOTE — Patient Instructions (Signed)
Managing Your Hypertension Hypertension is commonly called high blood pressure. This is when the force of your blood pressing against the walls of your arteries is too strong. Arteries are blood vessels that carry blood from your heart throughout your body. Hypertension forces the heart to work harder to pump blood, and may cause the arteries to become narrow or stiff. Having untreated or uncontrolled hypertension can cause heart attack, stroke, kidney disease, and other problems. What are blood pressure readings? A blood pressure reading consists of a higher number over a lower number. Ideally, your blood pressure should be below 120/80. The first ("top") number is called the systolic pressure. It is a measure of the pressure in your arteries as your heart beats. The second ("bottom") number is called the diastolic pressure. It is a measure of the pressure in your arteries as the heart relaxes. What does my blood pressure reading mean? Blood pressure is classified into four stages. Based on your blood pressure reading, your health care provider may use the following stages to determine what type of treatment you need, if any. Systolic pressure and diastolic pressure are measured in a unit called mm Hg. Normal  Systolic pressure: below 120.  Diastolic pressure: below 80. Elevated  Systolic pressure: 120-129.  Diastolic pressure: below 80. Hypertension stage 1  Systolic pressure: 130-139.  Diastolic pressure: 80-89. Hypertension stage 2  Systolic pressure: 140 or above.  Diastolic pressure: 90 or above. What health risks are associated with hypertension? Managing your hypertension is an important responsibility. Uncontrolled hypertension can lead to:  A heart attack.  A stroke.  A weakened blood vessel (aneurysm).  Heart failure.  Kidney damage.  Eye damage.  Metabolic syndrome.  Memory and concentration problems.  What changes can I make to manage my  hypertension? Hypertension can be managed by making lifestyle changes and possibly by taking medicines. Your health care provider will help you make a plan to bring your blood pressure within a normal range. Eating and drinking  Eat a diet that is high in fiber and potassium, and low in salt (sodium), added sugar, and fat. An example eating plan is called the DASH (Dietary Approaches to Stop Hypertension) diet. To eat this way: ? Eat plenty of fresh fruits and vegetables. Try to fill half of your plate at each meal with fruits and vegetables. ? Eat whole grains, such as whole wheat pasta, brown rice, or whole grain bread. Fill about one quarter of your plate with whole grains. ? Eat low-fat diary products. ? Avoid fatty cuts of meat, processed or cured meats, and poultry with skin. Fill about one quarter of your plate with lean proteins such as fish, chicken without skin, beans, eggs, and tofu. ? Avoid premade and processed foods. These tend to be higher in sodium, added sugar, and fat.  Reduce your daily sodium intake. Most people with hypertension should eat less than 1,500 mg of sodium a day.  Limit alcohol intake to no more than 1 drink a day for nonpregnant women and 2 drinks a day for men. One drink equals 12 oz of beer, 5 oz of wine, or 1 oz of hard liquor. Lifestyle  Work with your health care provider to maintain a healthy body weight, or to lose weight. Ask what an ideal weight is for you.  Get at least 30 minutes of exercise that causes your heart to beat faster (aerobic exercise) most days of the week. Activities may include walking, swimming, or biking.  Include exercise   to strengthen your muscles (resistance exercise), such as weight lifting, as part of your weekly exercise routine. Try to do these types of exercises for 30 minutes at least 3 days a week.  Do not use any products that contain nicotine or tobacco, such as cigarettes and e-cigarettes. If you need help quitting, ask  your health care provider.  Control any long-term (chronic) conditions you have, such as high cholesterol or diabetes. Monitoring  Monitor your blood pressure at home as told by your health care provider. Your personal target blood pressure may vary depending on your medical conditions, your age, and other factors.  Have your blood pressure checked regularly, as often as told by your health care provider. Working with your health care provider  Review all the medicines you take with your health care provider because there may be side effects or interactions.  Talk with your health care provider about your diet, exercise habits, and other lifestyle factors that may be contributing to hypertension.  Visit your health care provider regularly. Your health care provider can help you create and adjust your plan for managing hypertension. Will I need medicine to control my blood pressure? Your health care provider may prescribe medicine if lifestyle changes are not enough to get your blood pressure under control, and if:  Your systolic blood pressure is 130 or higher.  Your diastolic blood pressure is 80 or higher.  Take medicines only as told by your health care provider. Follow the directions carefully. Blood pressure medicines must be taken as prescribed. The medicine does not work as well when you skip doses. Skipping doses also puts you at risk for problems. Contact a health care provider if:  You think you are having a reaction to medicines you have taken.  You have repeated (recurrent) headaches.  You feel dizzy.  You have swelling in your ankles.  You have trouble with your vision. Get help right away if:  You develop a severe headache or confusion.  You have unusual weakness or numbness, or you feel faint.  You have severe pain in your chest or abdomen.  You vomit repeatedly.  You have trouble breathing. Summary  Hypertension is when the force of blood pumping through  your arteries is too strong. If this condition is not controlled, it may put you at risk for serious complications.  Your personal target blood pressure may vary depending on your medical conditions, your age, and other factors. For most people, a normal blood pressure is less than 120/80.  Hypertension is managed by lifestyle changes, medicines, or both. Lifestyle changes include weight loss, eating a healthy, low-sodium diet, exercising more, and limiting alcohol. This information is not intended to replace advice given to you by your health care provider. Make sure you discuss any questions you have with your health care provider. Document Released: 07/02/2012 Document Revised: 09/05/2016 Document Reviewed: 09/05/2016 Elsevier Interactive Patient Education  2018 [employer].   Major Depressive Disorder, Adult Major depressive disorder (MDD) is a mental health condition. MDD often makes you feel sad, hopeless, or helpless. MDD can also cause symptoms in your body. MDD can affect your:  Work.  School.  Relationships.  Other normal activities.  MDD can range from mild to very bad. It may occur once (single episode MDD). It can also occur many times (recurrent MDD). The main symptoms of MDD often include:  Feeling sad, depressed, or irritable most of the time.  Loss of interest.  MDD symptoms also include:  Sleeping  too much or too little.  Eating too much or too little.  A change in your weight.  Feeling tired (fatigue) or having low energy.  Feeling worthless.  Feeling guilty.  Trouble making decisions.  Trouble thinking clearly.  Thoughts of suicide or harming others.  Feeling weak.  Feeling agitated.  Keeping yourself from being around other people (isolation).  Follow these instructions at home: Activity  Do these things as told by your doctor: ? Go back to your normal activities. ? Exercise regularly. ? Spend time outdoors. Alcohol  Talk with your  doctor about how alcohol can affect your antidepressant medicines.  Do not drink alcohol. Or, limit how much alcohol you drink. ? This means no more than 1 drink a day for nonpregnant women and 2 drinks a day for men. One drink equals one of these:  12 oz of beer.  5 oz of wine.  1 oz of hard liquor. General instructions  Take over-the-counter and prescription medicines only as told by your doctor.  Eat a healthy diet.  Get plenty of sleep.  Find activities that you enjoy. Make time to do them.  Think about joining a support group. Your doctor may be able to suggest a group for you.  Keep all follow-up visits as told by your doctor. This is important. Where to find more information:  [employer] on Mental Illness: ? www.nami.org  [place] of Mental Health: ? http://www.maynard.net/  National Suicide Prevention Lifeline: ? (416)864-9105. This is free, 24-hour help. Contact a doctor if:  Your symptoms get worse.  You have new symptoms. Get help right away if:  You self-harm.  You see, hear, taste, smell, or feel things that are not present (hallucinate). If you ever feel like you may hurt yourself or others, or have thoughts about taking your own life, get help right away. You can go to your nearest emergency department or call:  Your local emergency services (911 in the [place]  A suicide crisis helpline, such as the National Suicide Prevention Lifeline: ? (701)843-6534. This is open 24 hours a day.  This information is not intended to replace advice given to you by your health care provider. Make sure you discuss any questions you have with your health care provider. Document Released: 09/19/2015 Document Revised: 06/24/2016 Document Reviewed: 06/24/2016 Elsevier Interactive Patient Education  2017 [employer].

## 2017-12-09 ENCOUNTER — Telehealth: Payer: Self-pay | Admitting: Pharmacist

## 2017-12-09 NOTE — Telephone Encounter (Signed)
Called Russell Carter to follow up on how his procedure Thursday went and whether he was back on his Coumadin or not. Patient reports that he did not have the injection because when they evaluated him, his nerves were too damaged. He restarted the Coumadin on Friday and continued with the Lovenox shots. Has appt with me Thursday this week, will get INR then. Patient denies any s/sx of bleeding.

## 2017-12-12 ENCOUNTER — Ambulatory Visit: Payer: Medicaid Other | Attending: Internal Medicine | Admitting: Pharmacist

## 2017-12-12 ENCOUNTER — Other Ambulatory Visit: Payer: Self-pay | Admitting: Internal Medicine

## 2017-12-12 DIAGNOSIS — F419 Anxiety disorder, unspecified: Secondary | ICD-10-CM | POA: Diagnosis present

## 2017-12-12 DIAGNOSIS — I2782 Chronic pulmonary embolism: Secondary | ICD-10-CM

## 2017-12-12 LAB — POCT INR: INR: 3

## 2017-12-12 MED ORDER — ALPRAZOLAM 0.5 MG PO TABS: 0.5000 mg | ORAL_TABLET | Freq: Two times a day (BID) | ORAL | 0 refills | Status: DC | PRN

## 2017-12-12 MED FILL — MECLIZINE 25 MG TABLET: 25 | 10 days supply | Qty: 30 | Fill #1

## 2017-12-12 MED FILL — METOCLOPRAMIDE 10 MG TABLET: 10 | 2 days supply | Qty: 10 | Fill #0

## 2017-12-12 MED FILL — OMEPRAZOLE DR 20 MG CAPSULE: 20 | 30 days supply | Qty: 30 | Fill #2

## 2017-12-12 MED FILL — ROSUVASTATIN CALCIUM 40 MG: 40 | 30 days supply | Qty: 30 | Fill #2

## 2017-12-12 NOTE — Progress Notes (Signed)
Patient came in for INR check and reported that he got a call this morning that his granddaughter is at [facility] on life support. He is very upset and would like to talk to PCP. Dr. Hyman Hopes saw patient and verbally ordered alprazolam 0.5 mg BID PRN anxiety #20. Order sent to him to [facility].

## 2017-12-14 IMAGING — DX DG HIP (WITH OR WITHOUT PELVIS) 2-3V*L*
3 series · 3 of 3 positions shown · non-contrast
Comparison: None.

CLINICAL DATA: LEFT hip pain, no injury.

EXAM:
DG HIP (WITH OR WITHOUT PELVIS) 2-3V LEFT

[pelvis ap]
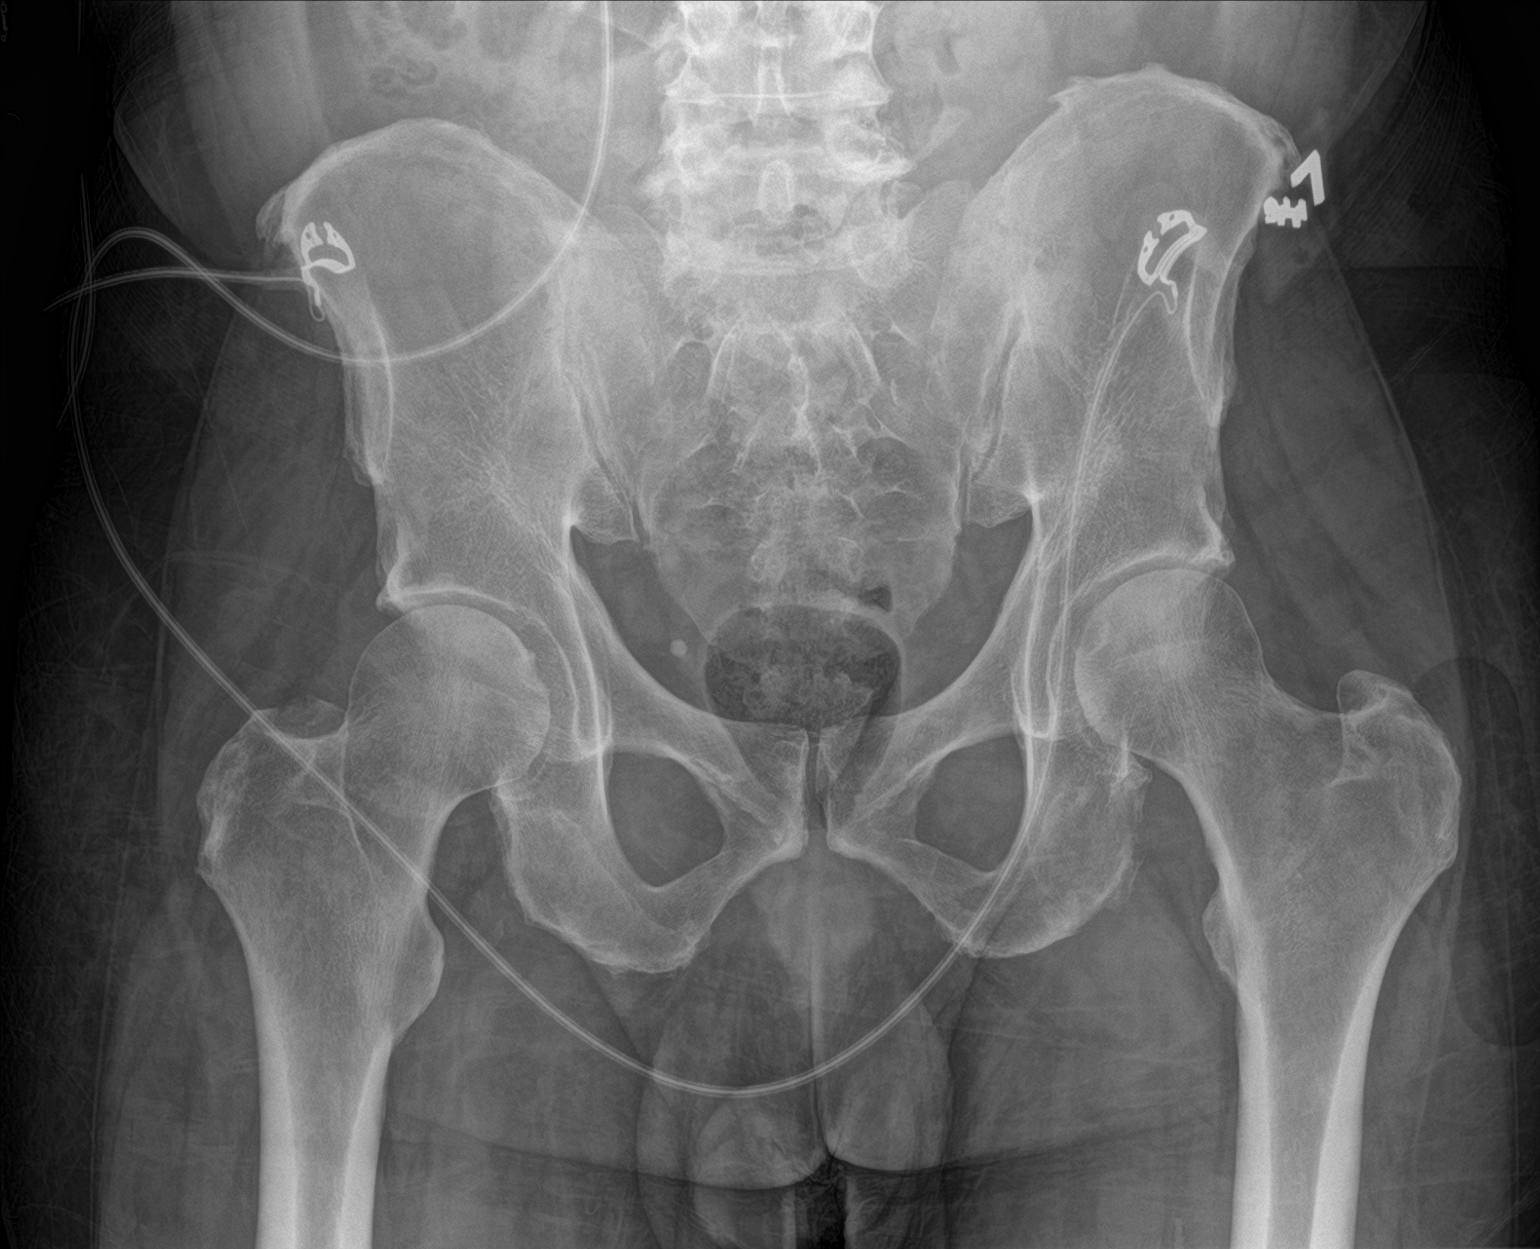

[hip ap]
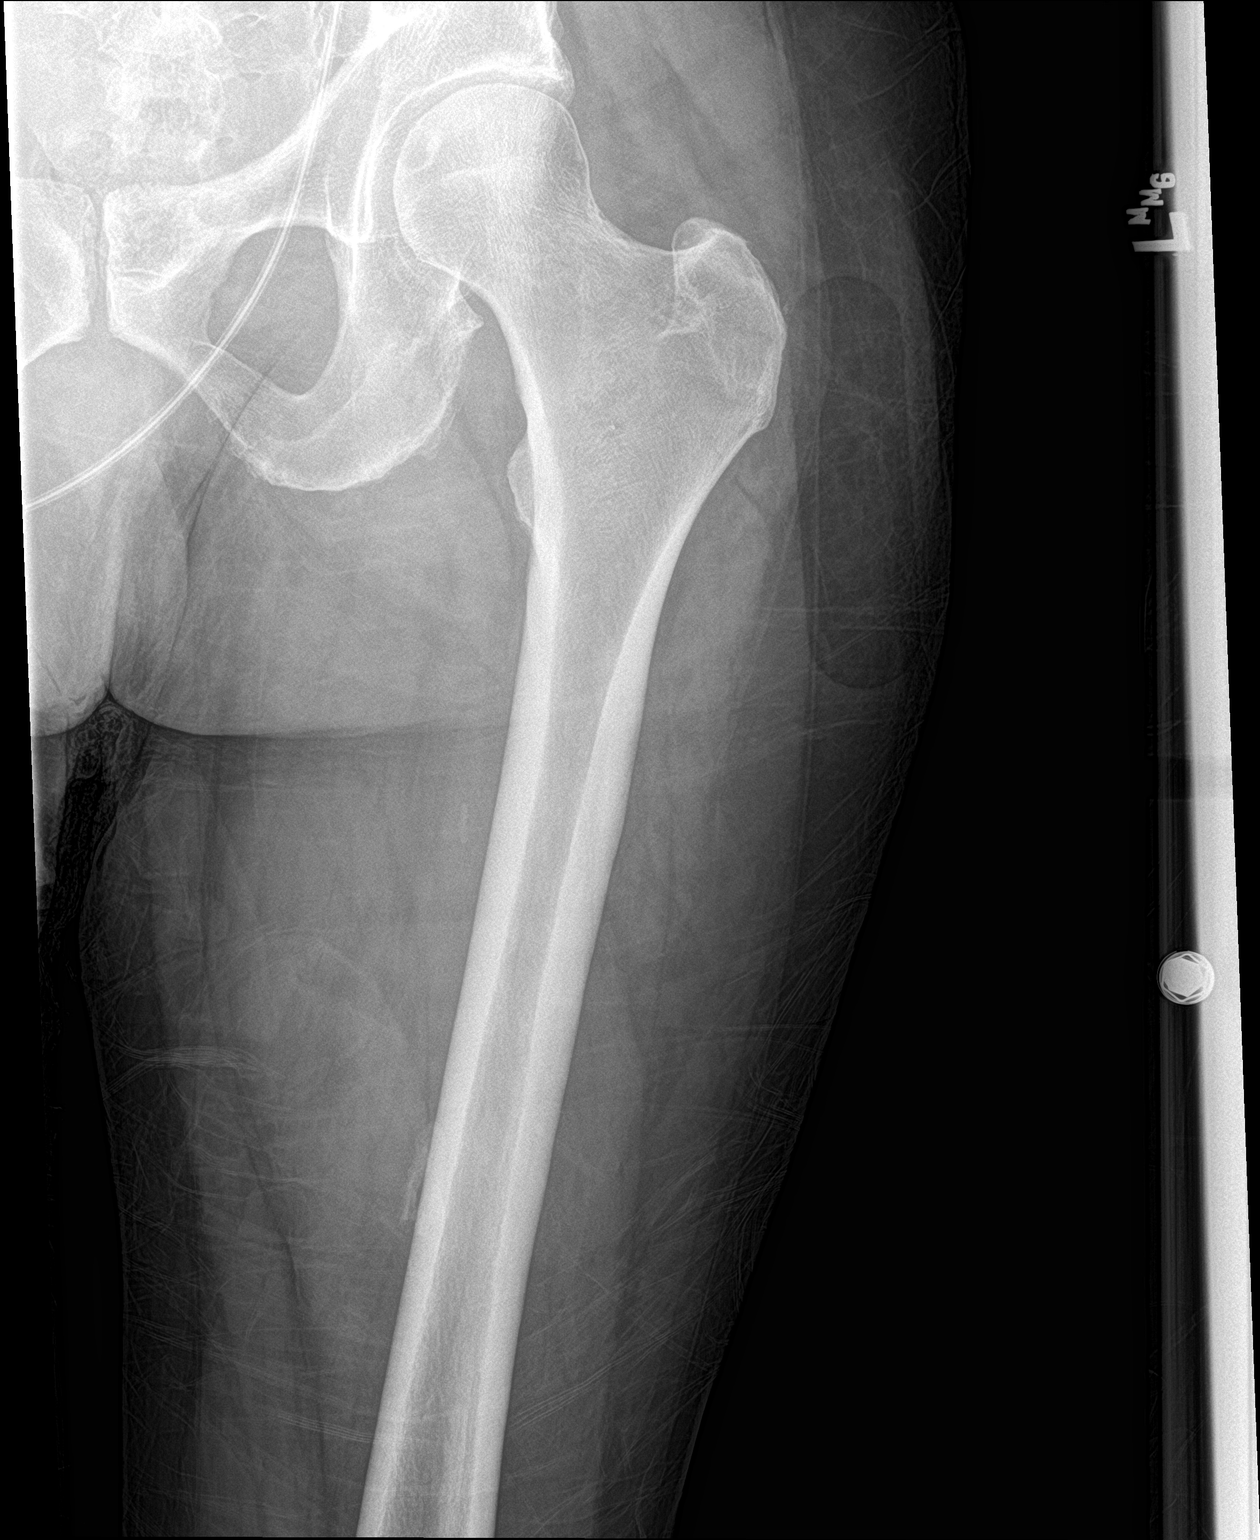

[hip lat]
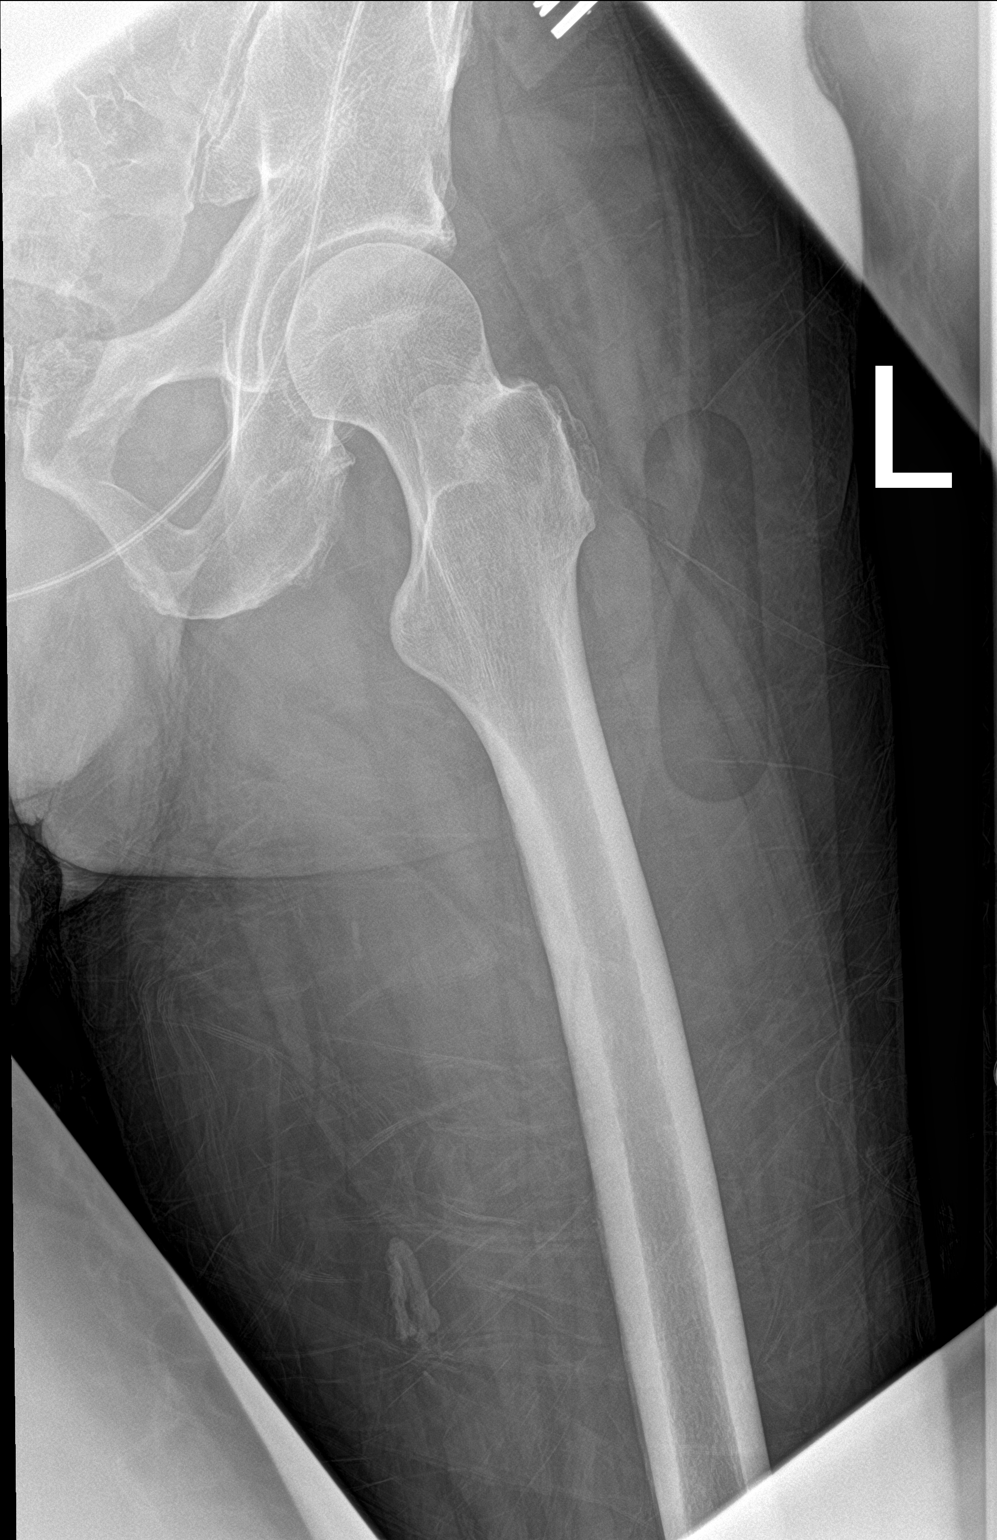

[3 of 3 positions shown; findings below may reference images not displayed]

FINDINGS: There is no evidence of hip fracture or dislocation. There is no
evidence of arthropathy or other focal bone abnormality. Small
heterotopic ossification medial mid to distal femur soft tissues.
IMPRESSION: Negative.

## 2017-12-14 IMAGING — DX DG CHEST 2V
2 series · 2 of 2 positions shown · non-contrast
Comparison: Chest radiograph February 14, 2017.

CLINICAL DATA: Shortness of breath and LEFT chest pain.

EXAM:
CHEST  2 VIEW

[chest lat]
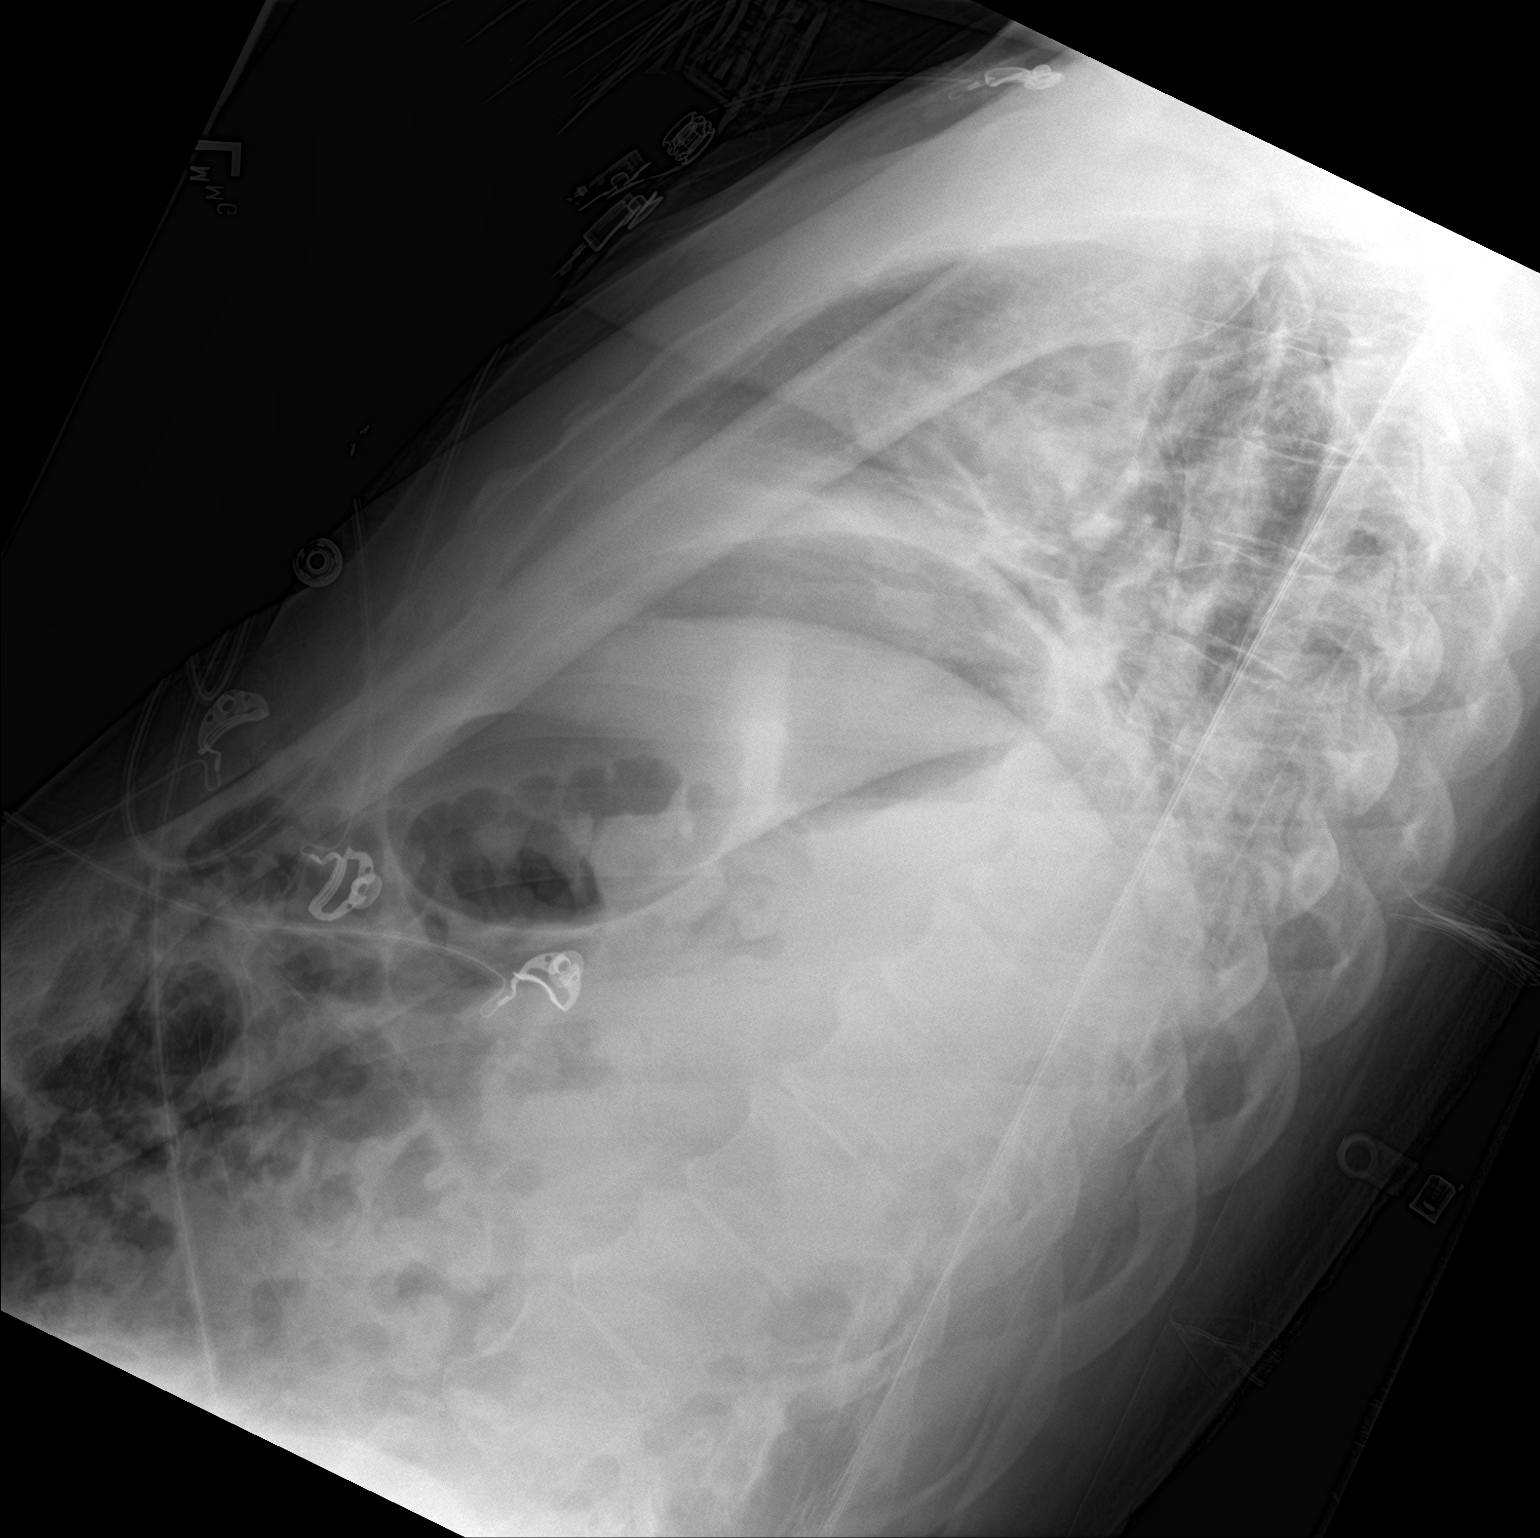

[chest ap]
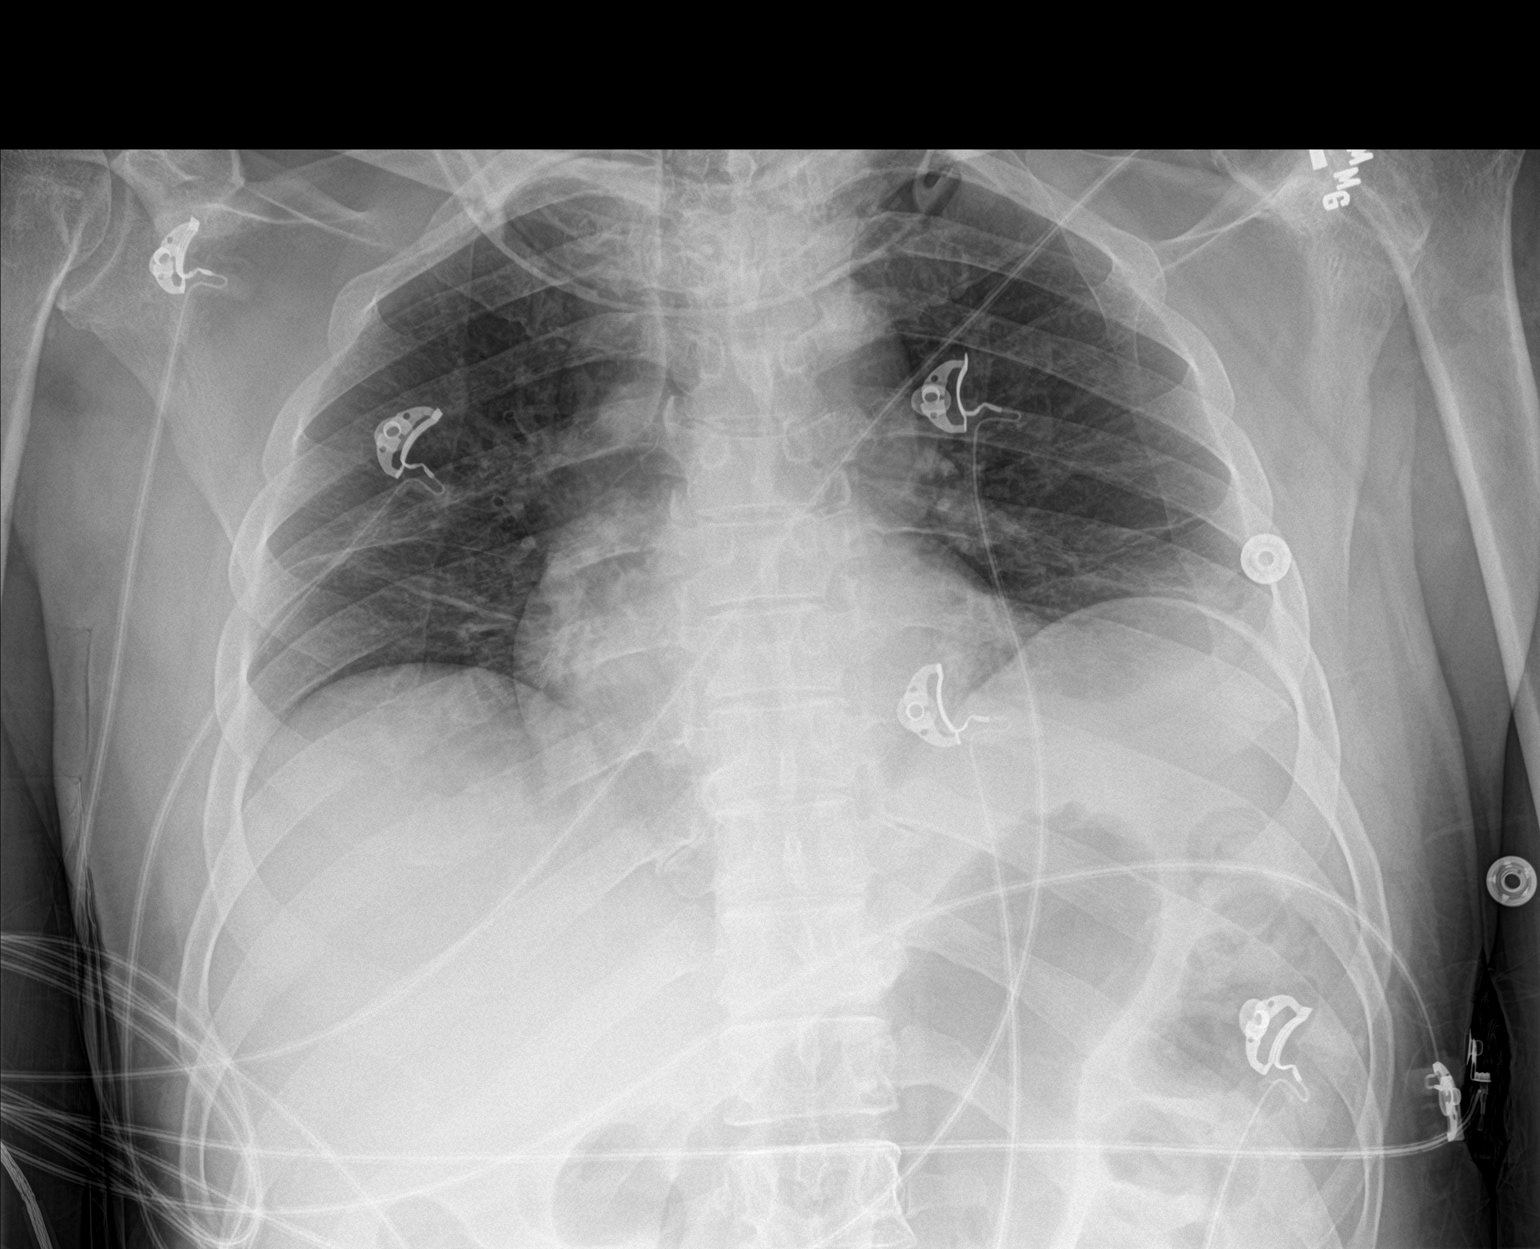

[2 of 2 positions shown; findings below may reference images not displayed]

FINDINGS: Cardiomediastinal silhouette is unremarkable for this low
inspiratory examination with crowded vasculature markings. The lungs
are clear without pleural effusions or focal consolidations.
Bibasilar strandy densities. Trachea projects midline and there is
no pneumothorax. Included soft tissue planes and osseous structures
are non-suspicious. Severe degenerative change of the LEFT
acromioclavicular joint. Cervical spine hardware.
IMPRESSION: Bibasilar atelectasis in this low inspiratory examination.

## 2017-12-15 IMAGING — CT CT ABD-PELV W/ CM
2 of 5 series · 16 of 46 positions shown, 18 images · IV contrast (iopamidol)
Comparison: 02/04/2015

CLINICAL DATA: Abdominal pain. History of renal insufficiency,
diabetes, cocaine use, blood in stools, inguinal hernia repair

EXAM:
CT ABDOMEN AND PELVIS WITH CONTRAST
TECHNIQUE: Multidetector CT imaging of the abdomen and pelvis was performed
using the standard protocol following bolus administration of
intravenous contrast.
CONTRAST:  100mL OIFX2C-I77 IOPAMIDOL (OIFX2C-I77) INJECTION 61%

[Series 4: abd/ pelvis 5.0 i30f 2 · axial · 0.89mm/px · z∈[-366,+84]mm · 13 of 104 slices shown, 15 images]
[im 7/104  soft-tissue]
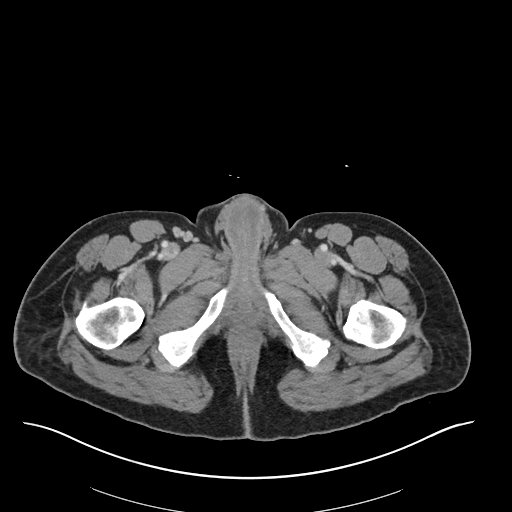
[im 7/104  bone]
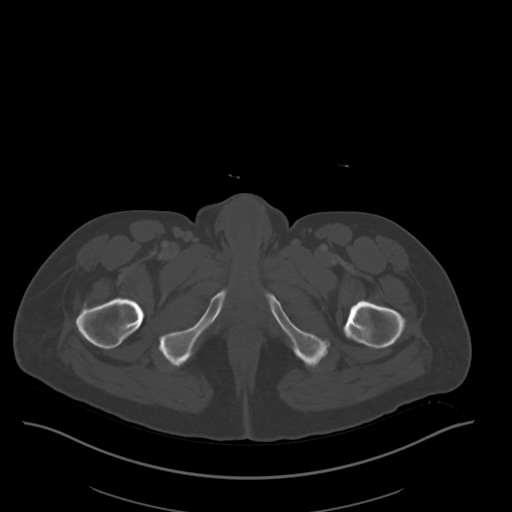
[im 14/104  soft-tissue]
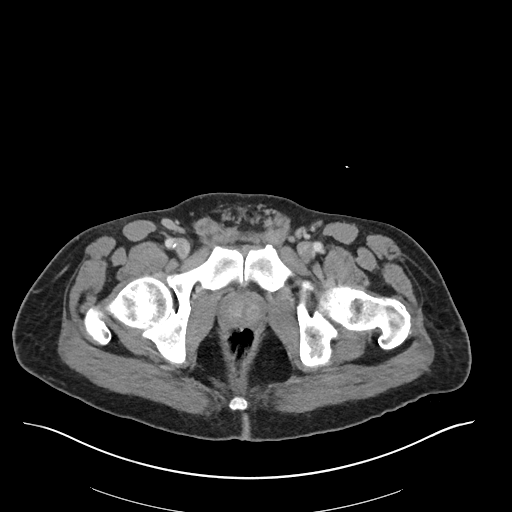
[im 21/104  soft-tissue]
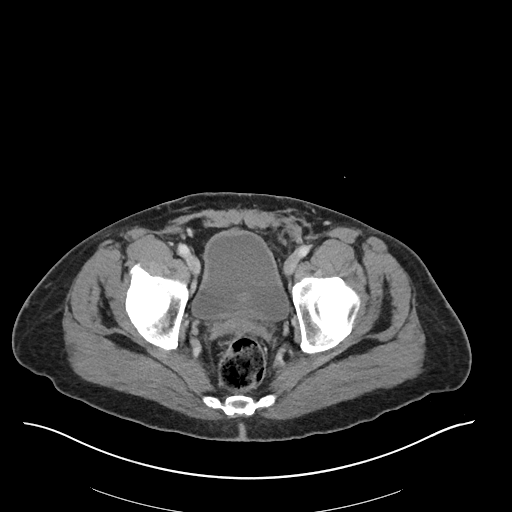
[im 28/104  soft-tissue]
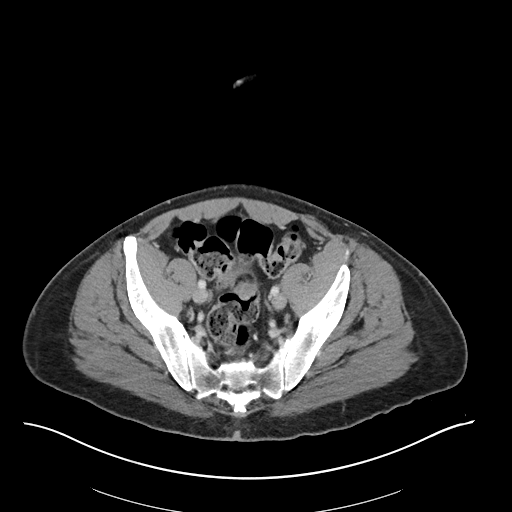
[im 35/104  soft-tissue]
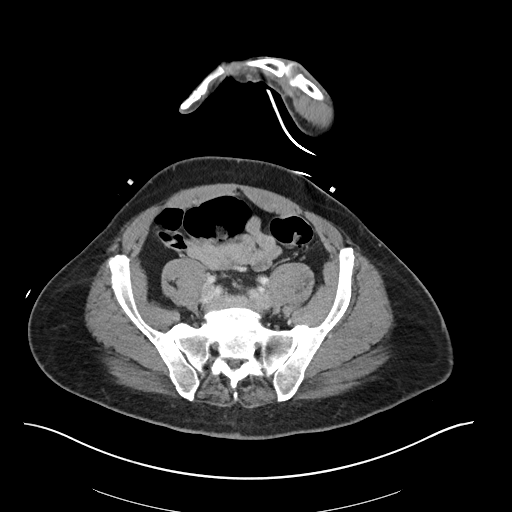
[im 42/104  soft-tissue]
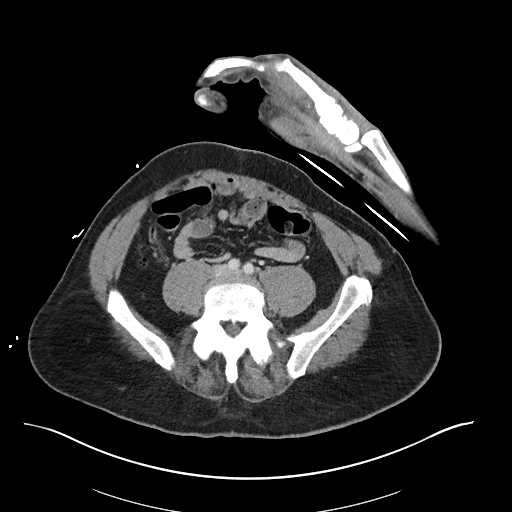
[im 55/104  soft-tissue]
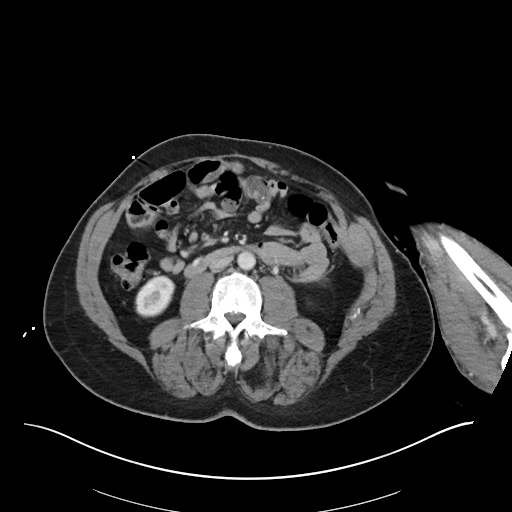
[im 62/104  soft-tissue]
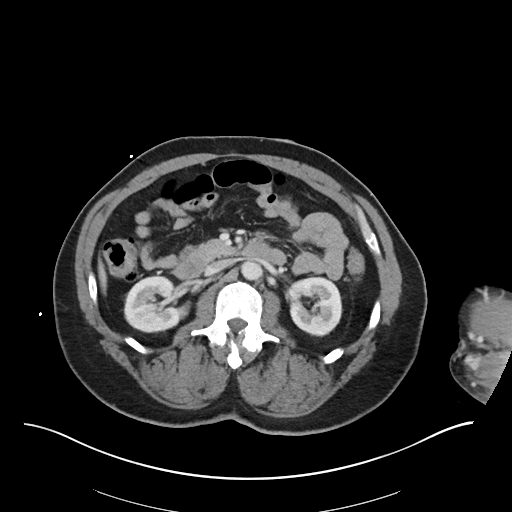
[im 69/104  soft-tissue]
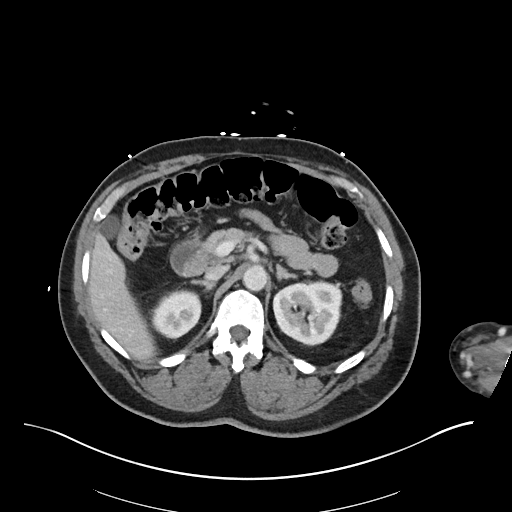
[im 69/104  bone]
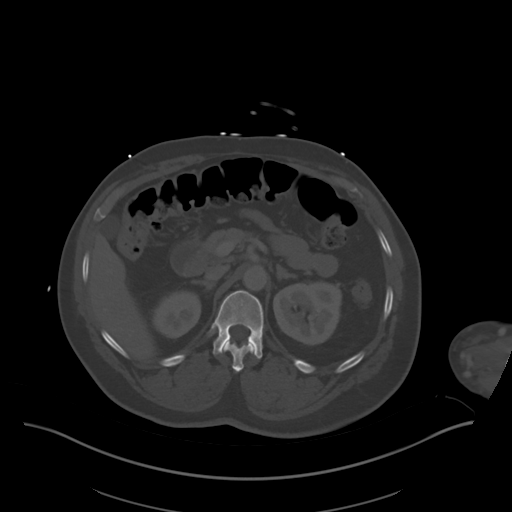
[im 76/104  soft-tissue]
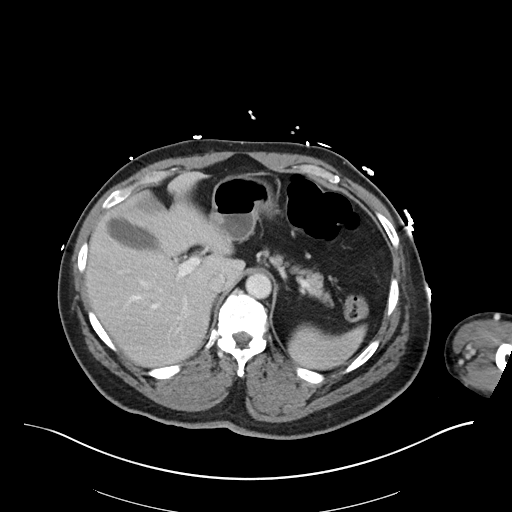
[im 83/104  soft-tissue]
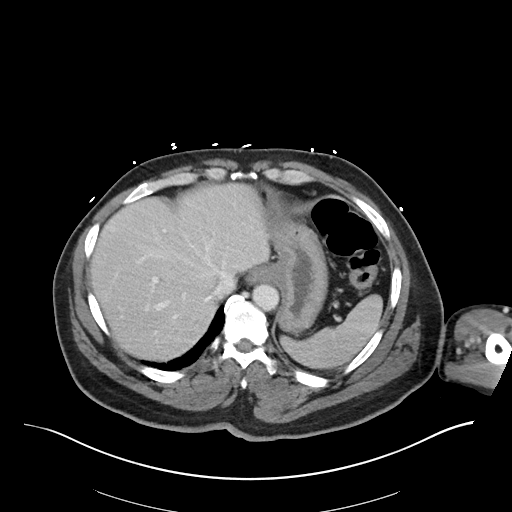
[im 90/104  soft-tissue]
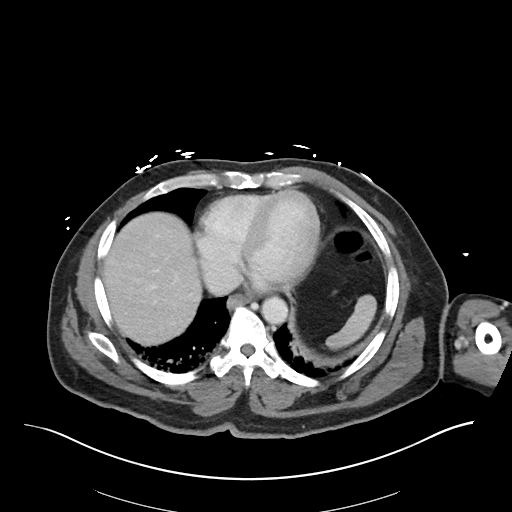
[im 97/104  soft-tissue]
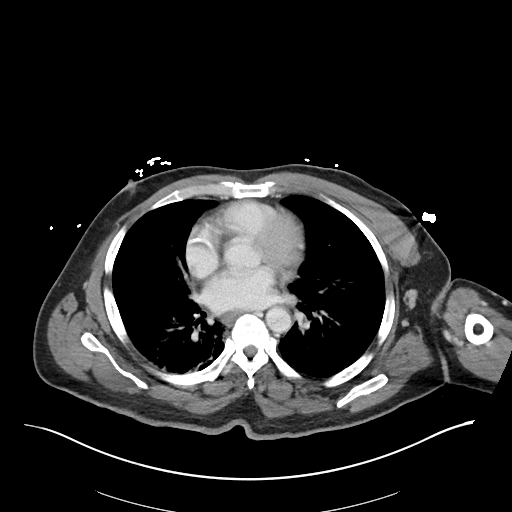

[Series 7: coronal soft tissue · coronal · 0.74mm/px · 3 of 87 slices shown]
[im 29/87  soft-tissue]
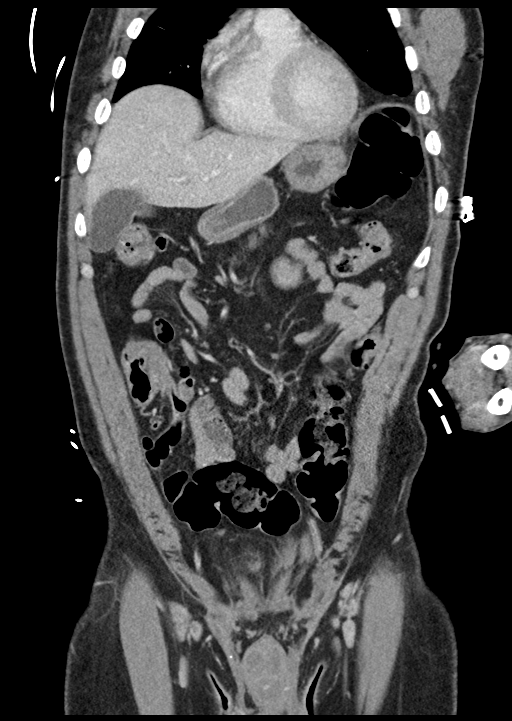
[im 39/87  soft-tissue]
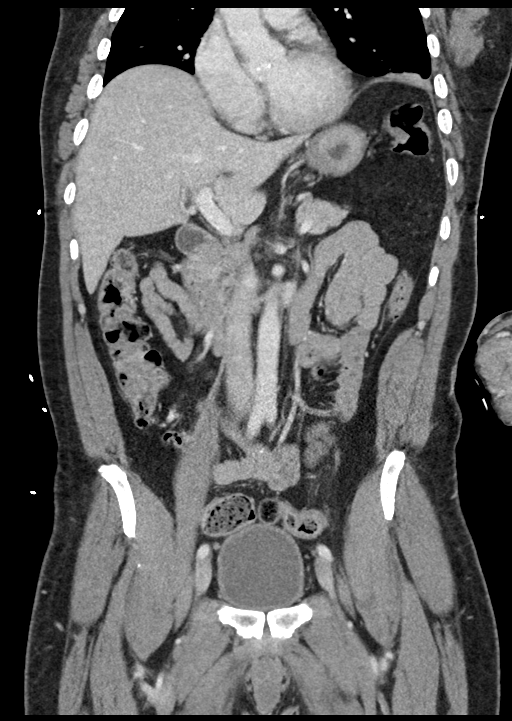
[im 48/87  soft-tissue]
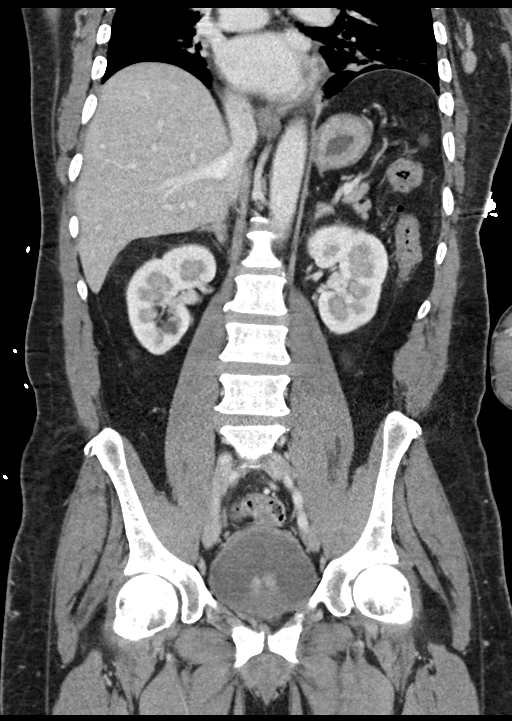

[16 of 46 positions shown; findings below may reference images not displayed]

FINDINGS: Lower chest: Linear atelectasis in the lung bases.

Hepatobiliary: No focal liver abnormality is seen. No gallstones,
gallbladder wall thickening, or biliary dilatation.

Pancreas: Unremarkable. No pancreatic ductal dilatation or
surrounding inflammatory changes.

Spleen: Normal in size without focal abnormality.

Adrenals/Urinary Tract: No adrenal gland nodules. Small parenchymal
cysts on the right kidney. No hydronephrosis or hydroureter. Bladder
wall is not thickened.

Stomach/Bowel: Stomach is within normal limits. Appendix appears
normal. No evidence of bowel wall thickening, distention, or
inflammatory changes.

Vascular/Lymphatic: No significant vascular findings are present. No
enlarged abdominal or pelvic lymph nodes.

Reproductive: Prostate gland is enlarged.

Other: No abdominal wall hernia or abnormality. No abdominopelvic
ascites.

Musculoskeletal: Degenerative changes in the spine. No destructive
bone lesions.
IMPRESSION: No acute process demonstrated in the abdomen or pelvis. No evidence
of bowel obstruction or inflammation.

## 2017-12-17 ENCOUNTER — Ambulatory Visit: Payer: Medicaid Other | Attending: Internal Medicine | Admitting: Pharmacist

## 2017-12-17 DIAGNOSIS — I2699 Other pulmonary embolism without acute cor pulmonale: Secondary | ICD-10-CM | POA: Insufficient documentation

## 2017-12-17 DIAGNOSIS — I2782 Chronic pulmonary embolism: Secondary | ICD-10-CM | POA: Insufficient documentation

## 2017-12-17 LAB — POCT INR: INR: 4.3

## 2017-12-23 MED FILL — LOSARTAN POTASSIUM 100 MG T: 100 | 30 days supply | Qty: 30 | Fill #2

## 2017-12-23 MED FILL — ALLOPURINOL 300 MG TABS: 300 | 30 days supply | Qty: 30 | Fill #4

## 2017-12-24 ENCOUNTER — Other Ambulatory Visit: Payer: Self-pay | Admitting: Internal Medicine

## 2017-12-24 ENCOUNTER — Ambulatory Visit: Payer: Medicaid Other | Attending: Internal Medicine | Admitting: Pharmacist

## 2017-12-24 DIAGNOSIS — Z79899 Other long term (current) drug therapy: Secondary | ICD-10-CM | POA: Insufficient documentation

## 2017-12-24 DIAGNOSIS — I2782 Chronic pulmonary embolism: Secondary | ICD-10-CM | POA: Insufficient documentation

## 2017-12-24 LAB — POCT INR: INR: 5.9

## 2017-12-24 MED FILL — MECLIZINE 25 MG TABLET: 25 | 10 days supply | Qty: 30 | Fill #1

## 2017-12-24 MED FILL — METOCLOPRAMIDE 10 MG TABLET: 10 | 2 days supply | Qty: 10 | Fill #0

## 2017-12-25 MED FILL — DULoxetine HCL 30 MG CPEP: 30 | 30 days supply | Qty: 60 | Fill #1

## 2017-12-30 ENCOUNTER — Other Ambulatory Visit: Payer: Self-pay | Admitting: Internal Medicine

## 2017-12-30 ENCOUNTER — Other Ambulatory Visit: Payer: Self-pay | Admitting: Physician Assistant

## 2017-12-30 DIAGNOSIS — K047 Periapical abscess without sinus: Secondary | ICD-10-CM

## 2017-12-30 MED FILL — GABAPENTIN 400 MG CAPSULE: 400 | 30 days supply | Qty: 60 | Fill #2

## 2018-01-01 ENCOUNTER — Encounter: Payer: Self-pay | Admitting: Physician Assistant

## 2018-01-01 ENCOUNTER — Ambulatory Visit: Payer: Medicaid Other | Attending: Internal Medicine | Admitting: Physician Assistant

## 2018-01-01 VITALS — BP 174/78 | HR 96 | Temp 98.4°F | Resp 18

## 2018-01-01 DIAGNOSIS — I252 Old myocardial infarction: Secondary | ICD-10-CM | POA: Diagnosis not present

## 2018-01-01 DIAGNOSIS — I2510 Atherosclerotic heart disease of native coronary artery without angina pectoris: Secondary | ICD-10-CM | POA: Diagnosis not present

## 2018-01-01 DIAGNOSIS — S86812A Strain of other muscle(s) and tendon(s) at lower leg level, left leg, initial encounter: Secondary | ICD-10-CM | POA: Diagnosis not present

## 2018-01-01 DIAGNOSIS — E119 Type 2 diabetes mellitus without complications: Secondary | ICD-10-CM | POA: Diagnosis not present

## 2018-01-01 DIAGNOSIS — I10 Essential (primary) hypertension: Secondary | ICD-10-CM | POA: Insufficient documentation

## 2018-01-01 DIAGNOSIS — E7800 Pure hypercholesterolemia, unspecified: Secondary | ICD-10-CM | POA: Insufficient documentation

## 2018-01-01 DIAGNOSIS — F329 Major depressive disorder, single episode, unspecified: Secondary | ICD-10-CM | POA: Insufficient documentation

## 2018-01-01 DIAGNOSIS — X501XXA Overexertion from prolonged static or awkward postures, initial encounter: Secondary | ICD-10-CM | POA: Diagnosis not present

## 2018-01-01 DIAGNOSIS — M25562 Pain in left knee: Secondary | ICD-10-CM | POA: Insufficient documentation

## 2018-01-01 DIAGNOSIS — Z8673 Personal history of transient ischemic attack (TIA), and cerebral infarction without residual deficits: Secondary | ICD-10-CM | POA: Diagnosis not present

## 2018-01-01 DIAGNOSIS — I2699 Other pulmonary embolism without acute cor pulmonale: Secondary | ICD-10-CM | POA: Insufficient documentation

## 2018-01-01 DIAGNOSIS — Z79899 Other long term (current) drug therapy: Secondary | ICD-10-CM | POA: Diagnosis not present

## 2018-01-01 DIAGNOSIS — I2782 Chronic pulmonary embolism: Secondary | ICD-10-CM

## 2018-01-01 DIAGNOSIS — S86912A Strain of unspecified muscle(s) and tendon(s) at lower leg level, left leg, initial encounter: Secondary | ICD-10-CM

## 2018-01-01 DIAGNOSIS — M109 Gout, unspecified: Secondary | ICD-10-CM | POA: Diagnosis not present

## 2018-01-01 DIAGNOSIS — Z91013 Allergy to seafood: Secondary | ICD-10-CM | POA: Insufficient documentation

## 2018-01-01 DIAGNOSIS — F1410 Cocaine abuse, uncomplicated: Secondary | ICD-10-CM | POA: Diagnosis not present

## 2018-01-01 LAB — POCT INR: INR: 2.3

## 2018-01-01 NOTE — Progress Notes (Signed)
Patient ID: Russell Carter, male   DOB: May 06, 1956, 62 y.o.   MRN: [id number]    Russell Carter, is a 62 y.o. male  [id number]  [id number]  DOB - [birth year]  Subjective:  Chief Complaint and HPI: Russell Carter is a 62 y.o. male here today for L knee pain.  Twisted L knee about 1 week ago.  Now having a lot of pain and swelling.  Pain is worse with weight bearing.  On ibuprofen and percocet for other pain management issues and still hurting even while taking that.  He was here for an anticoagulation appt and mentioned the knee to [name] and requested to see a provider.     ROS:   Constitutional:  No f/c, No night sweats, No unexplained weight loss. EENT:  No vision changes, No blurry vision, No hearing changes. No mouth, throat, or ear problems.  Respiratory: No cough, No SOB Cardiac: No CP, no palpitations GI:  No abd pain, No N/V/D. GU: No Urinary s/sx Musculoskeletal: +L knee pain Neuro: No headache, no dizziness, no motor weakness.  Skin: No rash Endocrine:  No polydipsia. No polyuria.  Psych: Denies SI/HI  No problems updated.  ALLERGIES: Allergies  Allergen Reactions  . Shellfish Allergy Anaphylaxis    PAST MEDICAL HISTORY: Past Medical History:  Diagnosis Date  . Anginal pain (HCC)   . Arthritis   . Blood in stool   . CAD (coronary artery disease)    a. 2011: cath showing mod non-obstructive disease b. 02/2016: cath with 95% stenosis in the Mid RCA. DES placed.  . Cervical stenosis of spine   . Cocaine abuse (HCC)    history  . Depression   . Diabetes mellitus without complication (HCC)   . Gout, unspecified   . Headache   . HTN (hypertension)   . Hypercholesterolemia   . Hyperglycemia   . Migraines   . Myocardial infarction [facility]) 2008   drug cocaine and marijuana at time of mi none  since    . Pneumonia   . Renal insufficiency   . Stroke [facility])     MEDICATIONS AT HOME: Prior to Admission medications   Medication Sig Start Date End Date  Taking? Authorizing Provider  allopurinol (ZYLOPRIM) 300 MG tablet Take 1 tablet (300 mg total) by mouth daily. 05/02/17   Quentin Angst, MD  ALPRAZolam Prudy Feeler) 0.5 MG tablet Take 1 tablet (0.5 mg total) by mouth 2 (two) times daily as needed for anxiety. 12/12/17   Quentin Angst, MD  amLODipine (NORVASC) 5 MG tablet Take 5 mg by mouth daily. 07/24/17   [provider]  CALCIUM-VITAMIN D PO Take 1 tablet by mouth daily.    [provider]  DULoxetine (CYMBALTA) 30 MG capsule Take 1 capsule (30 mg total) by mouth 2 (two) times daily. 11/20/17   Anders Simmonds, PA-C  ferrous sulfate 325 (65 FE) MG tablet Take 325 mg by mouth 3 (three) times daily with meals.    [provider]  gabapentin (NEURONTIN) 400 MG capsule Take 1 capsule (400 mg total) 2 (two) times daily by mouth. 09/04/17   Jegede, Olugbemiga E, MD  gabapentin (NEURONTIN) 600 MG tablet Take 600 mg by mouth 3 (three) times daily. 07/24/17   [provider]  hydrOXYzine (ATARAX/VISTARIL) 25 MG tablet TAKE 1 TABLET BY MOUTH EVERY 6 HOURS AS NEEDED FOR ANXIETY 12/30/17   Quentin Angst, MD  ibuprofen (ADVIL,MOTRIN) 800 MG tablet Take 1 tablet (800 mg total) by mouth  every 8 (eight) hours as needed. Patient not taking: Reported on 12/04/2017 11/20/17   Anders Simmonds, PA-C  losartan (COZAAR) 100 MG tablet Take 1 tablet (100 mg total) by mouth daily. 10/02/17   Quentin Angst, MD  meclizine (ANTIVERT) 25 MG tablet Take 1 tablet (25 mg total) by mouth 3 (three) times daily as needed for dizziness. 12/04/17   Quentin Angst, MD  methocarbamol (ROBAXIN) 750 MG tablet Take 1 tablet (750 mg total) every 8 (eight) hours as needed by mouth for muscle spasms. 09/04/17   Quentin Angst, MD  metoCLOPramide (REGLAN) 10 MG tablet TAKE 1 TABLET BY MOUTH EVERY 6 HOURS AS NEEDED FOR NAUSEA (OR HEADACHE). 12/24/17   Quentin Angst, MD  nitroGLYCERIN (NITROSTAT) 0.4 MG SL tablet Place 1  tablet (0.4 mg total) under the tongue every 5 (five) minutes as needed for chest pain. 05/02/17   Quentin Angst, MD  OXYCODONE HCL PO Take 7.5 mg by mouth.    [provider]  pantoprazole (PROTONIX) 40 MG tablet Take 1 tablet (40 mg total) by mouth at bedtime. 12/04/17   Quentin Angst, MD  penicillin v potassium (VEETID) 500 MG tablet Take 1 tablet (500 mg total) by mouth 4 (four) times daily. 11/20/17   Anders Simmonds, PA-C  rosuvastatin (CRESTOR) 40 MG tablet Take 1 tablet (40 mg total) at bedtime by mouth. 09/04/17 12/03/17  Quentin Angst, MD  famotidine (PEPCID) 20 MG tablet Take 1 tablet (20 mg total) by mouth 2 (two) times daily. 12/04/11 12/30/11  Grant Fontana, PA-C  TACRINE HYDROCHLORIDE PO Take 25 mg by mouth daily.    12/30/11  [provider]     Objective:  EXAM:   Vitals:   01/01/18 1614  BP: (!) 174/78  Pulse: 96  Resp: 18  Temp: 98.4 F (36.9 C)  TempSrc: Oral  SpO2: 96%    General appearance : A&OX3. NAD. Non-toxic-appearing HEENT: Atraumatic and Normocephalic.  PERRLA. EOM intact.   Neck: supple, no JVD. No cervical lymphadenopathy. No thyromegaly Chest/Lungs:  Breathing-non-labored, Good air entry bilaterally, breath sounds normal without rales, rhonchi, or wheezing  CVS: S1 S2 regular, no murmurs, gallops, rubs  Extremities: Bilateral Lower Ext shows no edema, both legs are warm to touch with = pulse throughout.  L knee with mild swelling compared with R, but no ballotment or effusion.  There is mild laxity medially.  Otherwise joint is stable.  ROM WNL with extension and 80% flexion.  No erythema or sign of infection.   Neurology:  CN II-XII grossly intact, Non focal.   Psych:  TP linear. J/I WNL. Normal speech. Appropriate eye contact and affect.  Skin:  No Rash  Data Review Lab Results  Component Value Date   HGBA1C 6.1 09/18/2017   HGBA1C 4.9 05/01/2017   HGBA1C 6.2 (H) 01/21/2017     Assessment & Plan    1. Other chronic pulmonary embolism without acute cor pulmonale (HCC) Anticoagulation visit-see Stacey Hammer's note  2. Knee strain, left, initial encounter - Ambulatory referral to Orthopedic Surgery     Patient have been counseled extensively about nutrition and exercise  Keep f/up appt in May 2019  The patient was given clear instructions to go to ER or return to medical center if symptoms don't improve, worsen or new problems develop. The patient verbalized understanding. The patient was told to call to get lab results if they haven't heard anything in the next week.  Georgian Co, PA-C [facility], [place] [phone]   01/01/2018, 4:19 PM

## 2018-01-07 ENCOUNTER — Ambulatory Visit: Payer: Medicaid Other | Attending: Internal Medicine | Admitting: Pharmacist

## 2018-01-07 DIAGNOSIS — Z7901 Long term (current) use of anticoagulants: Secondary | ICD-10-CM | POA: Insufficient documentation

## 2018-01-07 DIAGNOSIS — Z5181 Encounter for therapeutic drug level monitoring: Secondary | ICD-10-CM | POA: Diagnosis present

## 2018-01-07 DIAGNOSIS — I2782 Chronic pulmonary embolism: Secondary | ICD-10-CM

## 2018-01-07 LAB — POCT INR: INR: 1.7

## 2018-01-07 MED FILL — OMEPRAZOLE DR 20 MG CAPSULE: 20 | 30 days supply | Qty: 30 | Fill #3

## 2018-01-07 MED FILL — ROSUVASTATIN CALCIUM 40 MG: 40 | 30 days supply | Qty: 30 | Fill #1

## 2018-01-08 ENCOUNTER — Ambulatory Visit: Payer: Medicaid Other | Admitting: Family Medicine

## 2018-01-08 ENCOUNTER — Encounter: Payer: Self-pay | Admitting: Family Medicine

## 2018-01-08 DIAGNOSIS — M25562 Pain in left knee: Secondary | ICD-10-CM | POA: Diagnosis present

## 2018-01-08 MED ORDER — METHYLPREDNISOLONE ACETATE 40 MG/ML IJ SUSP
40.0000 mg | Freq: Once | INTRAMUSCULAR | Status: AC
  Administered 2018-01-08: 40 mg via INTRA_ARTICULAR

## 2018-01-08 NOTE — Patient Instructions (Addendum)
Your pain is due to a flare of arthritis, less likely a medial meniscus tear. Both are treated similarly initially. These are the different medications you can take for this: Continue your oxycodone as you have been. Capsaicin, aspercreme, or biofreeze topically up to four times a day may also help with pain. Some supplements that may help for arthritis: Boswellia extract, curcumin, pycnogenol Aleve 1-2 tabs twice a day with food Cortisone injections are an option - you were given this today. If cortisone injections do not help, there are different types of shots that may help but they take longer to take effect. It's important that you continue to stay active. Straight leg raises, knee extensions 3 sets of 10 once a day (add ankle weight if these become too easy). Consider physical therapy to strengthen muscles around the joint that hurts to take pressure off of the joint itself. Shoe inserts with good arch support may be helpful. Heat or ice 15 minutes at a time 3-4 times a day as needed to help with pain. Follow up with me in 4 weeks.

## 2018-01-10 ENCOUNTER — Encounter: Payer: Self-pay | Admitting: Family Medicine

## 2018-01-10 DIAGNOSIS — M25562 Pain in left knee: Secondary | ICD-10-CM | POA: Insufficient documentation

## 2018-01-10 NOTE — Assessment & Plan Note (Signed)
Patient's exam is reassuring.  He had a noncontact injury with twisting.  No evidence meniscus tear on exam.  Consistent with flare of his underlying arthritis.  Intraarticular cortisone injection given today.  Continue his oxycodone.  Topical medications, aleve, supplements that may help reviewed.  Shown home exercises to do daily.  F/u in 4 weeks.  Consider imaging if not improving as expected.  Cane if needed.  After informed written consent timeout was performed, patient was seated on exam table. Left knee was prepped with alcohol swab and utilizing anteromedial approach, patient's left knee was injected intraarticularly with 3:1 marcaine: depomedrol. Patient tolerated the procedure well without immediate complications.

## 2018-01-10 NOTE — Progress Notes (Signed)
PCP: Quentin Angst, MD  Subjective:   HPI: Patient is a 62 y.o. male here for left knee pain.  Patient reports about 2 weeks ago he accidentally stepped into a hole with his left foot and believes he twisted his left knee. Ever since then he has had medial left knee pain that is 10 out of 10 sharp. Associated swelling. He states that heat helps he normally takes oxycodone but more for his back and neck. He states he has 1 prior remote injury to this knee but he completely improved from this. No skin changes, numbness.  Past Medical History:  Diagnosis Date  . Anginal pain (HCC)   . Arthritis   . Blood in stool   . CAD (coronary artery disease)    a. 2011: cath showing mod non-obstructive disease b. 02/2016: cath with 95% stenosis in the Mid RCA. DES placed.  . Cervical stenosis of spine   . Cocaine abuse (HCC)    history  . Depression   . Diabetes mellitus without complication (HCC)   . Gout, unspecified   . Headache   . HTN (hypertension)   . Hypercholesterolemia   . Hyperglycemia   . Migraines   . Myocardial infarction [facility]) 2008   drug cocaine and marijuana at time of mi none  since    . Pneumonia   . Renal insufficiency   . Stroke [facility])     Current Outpatient Medications on File Prior to Visit  Medication Sig Dispense Refill  . allopurinol (ZYLOPRIM) 300 MG tablet Take 1 tablet (300 mg total) by mouth daily. 90 tablet 6  . ALPRAZolam (XANAX) 0.5 MG tablet Take 1 tablet (0.5 mg total) by mouth 2 (two) times daily as needed for anxiety. 20 tablet 0  . CALCIUM-VITAMIN D PO Take 1 tablet by mouth daily.    . DULoxetine (CYMBALTA) 30 MG capsule Take 1 capsule (30 mg total) by mouth 2 (two) times daily. 60 capsule 3  . ferrous sulfate 325 (65 FE) MG tablet Take 325 mg by mouth 3 (three) times daily with meals.    . gabapentin (NEURONTIN) 400 MG capsule Take 1 capsule (400 mg total) 2 (two) times daily by mouth. 180 capsule 3  . gabapentin (NEURONTIN) 600 MG tablet  Take 600 mg by mouth 3 (three) times daily.  0  . hydrOXYzine (ATARAX/VISTARIL) 25 MG tablet TAKE 1 TABLET BY MOUTH EVERY 6 HOURS AS NEEDED FOR ANXIETY 30 tablet 0  . ibuprofen (ADVIL,MOTRIN) 800 MG tablet Take 1 tablet (800 mg total) by mouth every 8 (eight) hours as needed. 30 tablet 0  . losartan (COZAAR) 100 MG tablet Take 1 tablet (100 mg total) by mouth daily. 30 tablet 2  . meclizine (ANTIVERT) 25 MG tablet Take 1 tablet (25 mg total) by mouth 3 (three) times daily as needed for dizziness. 30 tablet 1  . methocarbamol (ROBAXIN) 750 MG tablet Take 1 tablet (750 mg total) every 8 (eight) hours as needed by mouth for muscle spasms. 90 tablet 1  . metoCLOPramide (REGLAN) 10 MG tablet TAKE 1 TABLET BY MOUTH EVERY 6 HOURS AS NEEDED FOR NAUSEA (OR HEADACHE). 10 tablet 0  . nitroGLYCERIN (NITROSTAT) 0.4 MG SL tablet Place 1 tablet (0.4 mg total) under the tongue every 5 (five) minutes as needed for chest pain. 25 tablet 4  . OXYCODONE HCL PO Take 7.5 mg by mouth.    . pantoprazole (PROTONIX) 40 MG tablet Take 1 tablet (40 mg total) by mouth at bedtime.  30 tablet 1  . penicillin v potassium (VEETID) 500 MG tablet Take 1 tablet (500 mg total) by mouth 4 (four) times daily. 30 tablet 0  . omeprazole (PRILOSEC) 20 MG capsule Take 20 mg by mouth daily.  3  . rosuvastatin (CRESTOR) 40 MG tablet Take 1 tablet (40 mg total) at bedtime by mouth. 30 tablet 2  . [DISCONTINUED] famotidine (PEPCID) 20 MG tablet Take 1 tablet (20 mg total) by mouth 2 (two) times daily. 30 tablet 0  . [DISCONTINUED] TACRINE HYDROCHLORIDE PO Take 25 mg by mouth daily.       No current facility-administered medications on file prior to visit.     Past Surgical History:  Procedure Laterality Date  . ANTERIOR CERVICAL DECOMP/DISCECTOMY FUSION N/A 01/24/2017   Procedure: Cervical Four-Five,Cervical Five Six,Cervical Six-Seven Anterior cervical discectomy with fusion and plate with Cervical Three-Thoracic- One Laminectomy/Foraminotomy  with Cervical Three-Thoracic- One Fixation and fusion;  Surgeon: Loura Halt Ditty, MD;  Location: [facility] OR;  Service: Neurosurgery;  Laterality: N/A;  Part-1 anterior approach  . CARDIAC CATHETERIZATION  2008; ~ 2011   [place]-40%lad  . CARDIAC CATHETERIZATION N/A 03/16/2016   Procedure: Left Heart Cath and Coronary Angiography;  Surgeon: Marykay Lex, MD;  Location: [facility] INVASIVE CV LAB;  Service: Cardiovascular;  Laterality: N/A;  . CORONARY ANGIOPLASTY     STENTS  . EYE SURGERY Bilateral    "for double vision"  . INGUINAL HERNIA REPAIR  05/12/2012   Procedure: HERNIA REPAIR INGUINAL ADULT;  Surgeon: Liz Malady, MD;  Location: [facility];  Service: General;  Laterality: Right;  . INGUINAL HERNIA REPAIR Left 02/02/2003   Dr. Violeta Gelinas. repair with mesh. Same procedure done again in  05/22/2004   . INGUINAL HERNIA REPAIR Left ?date 2nd OR  . POSTERIOR CERVICAL FUSION/FORAMINOTOMY N/A 01/24/2017   Procedure: Cervical Three-Thoracic One-Laminectomy/Foraminotomy with Cervical Three-Thoracic One Fixation and fusion;  Surgeon: Loura Halt Ditty, MD;  Location: [facility] OR;  Service: Neurosurgery;  Laterality: N/A;  Part-2 Posterior approach  . RECONSTRUCT / STABILIZE DISTAL ULNA    . SHOULDER OPEN ROTATOR CUFF REPAIR Left 2010  . SHOULDER SURGERY Left    "injured on the job; had to cut small bone out"  . THUMB FUSION Right     Allergies  Allergen Reactions  . Shellfish Allergy Anaphylaxis    Social History   Socioeconomic History  . Marital status: Widowed    Spouse name: Not on file  . Number of children: Not on file  . Years of education: Not on file  . Highest education level: Not on file  Occupational History  . Not on file  Social Needs  . Financial resource strain: Not on file  . Food insecurity:    Worry: Not on file    Inability: Not on file  . Transportation needs:    Medical: Not on file    Non-medical: Not on file  Tobacco Use  . Smoking status:  Never Smoker  . Smokeless tobacco: Never Used  Substance and Sexual Activity  . Alcohol use: Yes    Comment: 16 oz beer every 2-3 days  . Drug use: Yes    Types: "Crack" cocaine, Marijuana, Cocaine    Comment: 03/14/2016 "quit 2008"  . Sexual activity: Not Currently  Lifestyle  . Physical activity:    Days per week: Not on file    Minutes per session: Not on file  . Stress: Not on file  Relationships  . Social connections:  Talks on phone: Not on file    Gets together: Not on file    Attends religious service: Not on file    Active member of club or organization: Not on file    Attends meetings of clubs or organizations: Not on file    Relationship status: Not on file  . Intimate partner violence:    Fear of current or ex partner: Not on file    Emotionally abused: Not on file    Physically abused: Not on file    Forced sexual activity: Not on file  Other Topics Concern  . Not on file  Social History Narrative   Lives in [place] with his wife and son.     Family History  Problem Relation Age of Onset  . Heart attack Mother        x7. Still alive  . Heart attack Father        deceased b/c of MI  . Heart attack Brother        older, deceased b/c of MI   . Cancer Brother        bone cancer in 1 brother, ? brain cancer in other brother    BP (!) 129/93   Ht 5\' 11"  (1.803 m)   Wt 214 lb (97.1 kg)   BMI 29.85 kg/m   Review of Systems: See HPI above.     Objective:  Physical Exam:  Gen: NAD, comfortable in exam room  Left knee: No gross deformity, ecchymoses.  Mild effusion. TTP medial joint line. FROM with 5/5 strength. Negative ant/post drawers. Negative valgus/varus testing. Negative lachmanns. Negative mcmurrays, apleys, patellar apprehension. NV intact distally.  Right knee: No deformity. FROM with 5/5 strength. No tenderness to palpation. NVI distally.   Assessment & Plan:  1.  Left knee pain:  Patient's exam is reassuring.  He had a  noncontact injury with twisting.  No evidence meniscus tear on exam.  Consistent with flare of his underlying arthritis.  Intraarticular cortisone injection given today.  Continue his oxycodone.  Topical medications, aleve, supplements that may help reviewed.  Shown home exercises to do daily.  F/u in 4 weeks.  Consider imaging if not improving as expected.  Cane if needed.  After informed written consent timeout was performed, patient was seated on exam table. Left knee was prepped with alcohol swab and utilizing anteromedial approach, patient's left knee was injected intraarticularly with 3:1 marcaine: depomedrol. Patient tolerated the procedure well without immediate complications.

## 2018-01-13 ENCOUNTER — Other Ambulatory Visit: Payer: Self-pay | Admitting: Cardiology

## 2018-01-14 ENCOUNTER — Ambulatory Visit: Payer: Medicaid Other | Attending: Internal Medicine | Admitting: Pharmacist

## 2018-01-14 DIAGNOSIS — G540 Brachial plexus disorders: Secondary | ICD-10-CM | POA: Insufficient documentation

## 2018-01-14 DIAGNOSIS — I2782 Chronic pulmonary embolism: Secondary | ICD-10-CM

## 2018-01-14 LAB — POCT INR: INR: 2.5

## 2018-01-15 ENCOUNTER — Ambulatory Visit: Payer: Medicaid Other | Attending: Neurosurgery | Admitting: Physical Therapy

## 2018-01-15 ENCOUNTER — Other Ambulatory Visit: Payer: Self-pay

## 2018-01-15 DIAGNOSIS — M79602 Pain in left arm: Secondary | ICD-10-CM

## 2018-01-15 DIAGNOSIS — G540 Brachial plexus disorders: Secondary | ICD-10-CM | POA: Diagnosis present

## 2018-01-15 DIAGNOSIS — M6281 Muscle weakness (generalized): Secondary | ICD-10-CM | POA: Diagnosis present

## 2018-01-15 NOTE — Therapy (Signed)
[facility] [address] [place], [place], [address] Phone: [phone]   Fax:  [phone]  Physical Therapy Evaluation  Patient Details  Name: Russell Carter MRN: [id number] Date of Birth: [birth year] Referring Provider: Maeola Harman, MD   Encounter Date: 01/15/2018  PT End of Session - 01/15/18 1452    Visit Number  1    Number of Visits  4    Date for PT Re-Evaluation  02/14/18 to accomodate for MCD auth period    Authorization Type  MCD waiting for auth    PT Start Time  1452    PT Stop Time  1531    PT Time Calculation (min)  39 min    Activity Tolerance  Patient tolerated treatment well    Behavior During Therapy  [facility] for tasks assessed/performed       Past Medical History:  Diagnosis Date  . Anginal pain (HCC)   . Arthritis   . Blood in stool   . CAD (coronary artery disease)    a. 2011: cath showing mod non-obstructive disease b. 02/2016: cath with 95% stenosis in the Mid RCA. DES placed.  . Cervical stenosis of spine   . Cocaine abuse (HCC)    history  . Depression   . Diabetes mellitus without complication (HCC)   . Gout, unspecified   . Headache   . HTN (hypertension)   . Hypercholesterolemia   . Hyperglycemia   . Migraines   . Myocardial infarction [facility]) 2008   drug cocaine and marijuana at time of mi none  since    . Pneumonia   . Renal insufficiency   . Stroke [facility])     Past Surgical History:  Procedure Laterality Date  . ANTERIOR CERVICAL DECOMP/DISCECTOMY FUSION N/A 01/24/2017   Procedure: Cervical Four-Five,Cervical Five Six,Cervical Six-Seven Anterior cervical discectomy with fusion and plate with Cervical Three-Thoracic- One Laminectomy/Foraminotomy with Cervical Three-Thoracic- One Fixation and fusion;  Surgeon: Loura Halt Ditty, MD;  Location: [facility] OR;  Service: Neurosurgery;  Laterality: N/A;  Part-1 anterior approach  . CARDIAC CATHETERIZATION  2008; ~ 2011   [place]-40%lad  . CARDIAC  CATHETERIZATION N/A 03/16/2016   Procedure: Left Heart Cath and Coronary Angiography;  Surgeon: Marykay Lex, MD;  Location: [facility] INVASIVE CV LAB;  Service: Cardiovascular;  Laterality: N/A;  . CORONARY ANGIOPLASTY     STENTS  . EYE SURGERY Bilateral    "for double vision"  . INGUINAL HERNIA REPAIR  05/12/2012   Procedure: HERNIA REPAIR INGUINAL ADULT;  Surgeon: Liz Malady, MD;  Location: [facility];  Service: General;  Laterality: Right;  . INGUINAL HERNIA REPAIR Left 02/02/2003   Dr. Violeta Gelinas. repair with mesh. Same procedure done again in  05/22/2004   . INGUINAL HERNIA REPAIR Left ?date 2nd OR  . POSTERIOR CERVICAL FUSION/FORAMINOTOMY N/A 01/24/2017   Procedure: Cervical Three-Thoracic One-Laminectomy/Foraminotomy with Cervical Three-Thoracic One Fixation and fusion;  Surgeon: Loura Halt Ditty, MD;  Location: [facility] OR;  Service: Neurosurgery;  Laterality: N/A;  Part-2 Posterior approach  . RECONSTRUCT / STABILIZE DISTAL ULNA    . SHOULDER OPEN ROTATOR CUFF REPAIR Left 2010  . SHOULDER SURGERY Left    "injured on the job; had to cut small bone out"  . THUMB FUSION Right     There were no vitals filed for this visit.   Subjective Assessment - 01/15/18 1454    Subjective  Lt side of neck and into shoulder and down to fingers 3-5. January 24 2017 had surgery on neck- was having shoulder/arm pain and now the same pain is worse. HA every other day at the base of skull, left side. Lifting or moving arm results in excruciating pain. Pain mostly stops at elbow, numbness will just go to fingers. Dizziness and LOB noted when standing or sitting from supine. Gets really dizzy and falls on floor when standing resulting in 10+ falls in last 6 mo. Denies any falls before surgery.     Patient Stated Goals  decrease pain, play basketball/tennis, lift weights, armed securty, karate, dress/cook independently    Currently in Pain?  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  10+    Has the patient had a decrease in activity level because of a fear of falling?   Yes    Is the patient reluctant to leave their home because of a fear of falling?   No      Home Public house manager residence    Living Arrangements  Parent    Additional Comments  one step at home      Prior Function   Level of Independence  Needs assistance with ADLs      Cognition   Overall Cognitive Status  Within Functional Limits for tasks assessed      Sensation   Additional Comments  numbness Lt hand      Posture/Postural Control   Posture Comments  forward head, rounded shoulders      ROM / Strength   AROM / PROM / Strength  AROM;Strength      AROM   AROM Assessment Site  Shoulder;Elbow    Right/Left Shoulder  Left    Left Shoulder Extension  40 Degrees    Left Shoulder Flexion  45 Degrees    Left Shoulder ABduction  15 Degrees    Left Shoulder External Rotation  -- unable to reach up to head    Right/Left Elbow  Left    Left Elbow Flexion  -- unable in supination, 14 deg in neutral      Strength   Strength Assessment Site  Hand    Right/Left hand  Right;Left    Right Hand Grip (lbs)  85    Left Hand Grip (lbs)  50       Palpation   Palpation comment  TTP Lt upper trap, scalenes, supraspinatus  No data recorded  Objective measurements completed on examination: See above findings.      [facility] Adult PT Treatment/Exercise - 01/15/18 0001      Exercises   Exercises  Shoulder      Shoulder Exercises: Seated   Retraction  10 reps    Other Seated Exercises  retraction +cervical rotation    Other Seated Exercises  PROM GHJ flx, elbow flx             PT Education - 01/15/18 1615    Education provided  Yes    Education Details  anatomy of condition, POC, HEP, exercise form/rationale, healing time to expect    Person(s) Educated  Patient    Methods  Explanation;Demonstration;Tactile cues;Verbal cues;Handout    Comprehension  Verbalized understanding;Need further instruction;Returned demonstration;Verbal cues required;Tactile cues required       PT Short Term Goals - 01/15/18 1606      PT SHORT TERM GOAL #1   Title  Pt will be able to use scapular retraction through the day to provide support to postural alignment     Baseline  began educating at eval    Time  4    Period  Weeks    Status  New    Target Date  02/14/18      PT SHORT TERM GOAL #2   Title  Pt will demo proper form with HEP for necessary challenge to joint ROM and muscular activation    Baseline  will progress as appropriate    Time  4    Period  Weeks    Status  New    Target Date  02/14/18        PT Long Term Goals - 01/15/18 1608      PT LONG TERM GOAL #1   Title  Pt will be able to dress independently with minimal difficulty    Baseline  severe difficulty with severe pain at eval    Time  14    Period  Weeks    Status  New    Target Date  04/25/18      PT LONG TERM GOAL #2   Title  Average pain <=5/10 to decrease pain limitations on daily activities    Baseline  10/10 at eval    Time  14    Period  Weeks    Status  New    Target Date  04/25/18      PT LONG TERM GOAL #3   Title  Pt  will be able to use Left arm to cook for himself for return to independence    Baseline  unable, Mom does most of the cooking because he is unable to lift pots/pans/plates    Time  14    Period  Weeks    Status  New    Target Date  04/25/18      PT LONG TERM GOAL #4   Title  Pt will be able to return to light sporting activities such as beginning karate and basketball to begin return to PLOF    Baseline  unable at eval    Time  14    Period  Weeks    Status  New    Target Date  04/25/18             Plan - 01/15/18 1546    Clinical Impression Statement  Pt presents to PT with complaints of Left-sided neck and shoulder pain and refers to digits 3-5 that  worsened after surgical intervention to cervical region, C4-7 ACDF and C3-T1 fusion 01/24/2017. Unable to supinate or perform biceps curl, unable to perform active shoulder flexion above 45 deg and severe abduction limitaitons. Grip strength limited vs Rt. Pt limited in daily activities and is in severe pain on a regular basis. Pt will benefit from skilled PT in order to reach functional goals.     History and Personal Factors relevant to plan of care:  depression, arthritis, frequent falls due to apparent postural hypotension    Clinical Presentation  Stable    Clinical Decision Making  Low    Rehab Potential  Fair    PT Frequency  -- 3 visits in first auth, 2/week 10 weeks following    PT Treatment/Interventions  ADLs/Self Care Home Management;Cryotherapy;Electrical Stimulation;Ultrasound;Moist Heat;Iontophoresis 4mg /ml Dexamethasone;Therapeutic activities;Therapeutic exercise;Patient/family education;Manual techniques;Scar mobilization;Passive range of motion;Taping;Dry needling    PT Next Visit Plan  PROM/AAROM GHJ, periscapular strengthening, STM to cervical/GHJ musculature    PT Home Exercise Plan  PROM elbow & GHJ flexion, scap retraction, cervical rotation+scap retraction    Consulted and Agree with Plan of Care  Patient        Patient will benefit from skilled therapeutic intervention in order to improve the following deficits and impairments:  Pain, Improper body mechanics, Postural dysfunction, Increased muscle spasms, Decreased activity tolerance, Decreased range of motion, Decreased strength, Impaired UE functional use  Visit Diagnosis: 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 Left knee pain 01/10/2018  . Dental caries 09/18/2017  . Syncope and collapse 09/04/2017  . Left arm weakness 08/07/2017  . Numbness and tingling in both hands 07/31/2017  . Gastroesophageal reflux disease without esophagitis 07/31/2017  . Moderate episode of recurrent major depressive disorder (HCC) 07/31/2017  . 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 Chronic pain disorder 06/05/2017  . Supratherapeutic INR 04/27/2017  . Gastrointestinal hemorrhage with melena   . Blood loss anemia 04/25/2017  . Pulmonary embolism (HCC) 02/14/2017  . Fatigue associated with anemia   . Post-operative pain   . Radicular pain   . Diabetes mellitus type 2 in nonobese (HCC)   . Surgery, elective   . Postoperative pain   . Leukocytosis   . Thrombocytopenia (HCC)   . History of myocardial infarction   . Cocaine abuse (HCC)   . Depression   . Type 2 diabetes mellitus with hyperglycemia, without long-term current use of insulin (HCC)   . Stage 3 chronic kidney disease (HCC)   . Benign essential HTN   . History of CVA (cerebrovascular accident)   . Prediabetes   . Cervical spondylosis with radiculopathy 01/24/2017  . Dyslipidemia 03/15/2016  . Unstable angina (HCC) - chest pain with complete heart block & chest pain 03/15/2016  . Complete heart block (HCC) 03/14/2016  .  Cervical spinal stenosis 03/14/2016  . Chest pain with moderate risk of acute coronary syndrome 03/13/2016  . CAD-50% LAD 2011 12/14/2014  . Gout 11/16/2014  . Acute intractable headache 07/15/2013  . Polysubstance abuse (HCC) 01/15/2013  . DIZZINESS 06/05/2010  . Essential hypertension 05/18/2009  . Acute myocardial infarction (HCC) 05/18/2009  . MUSCLE SPASM 05/18/2009   Amada Hallisey C. Gurnoor Ursua PT, DPT 01/15/18 4:19 PM  [facility] [address] [place], [place], [address] Phone: [phone]   Fax:  [phone]  Name: Russell Carter MRN: [id number] Date of Birth: 04-09-1956

## 2018-01-17 ENCOUNTER — Telehealth: Payer: Self-pay | Admitting: Internal Medicine

## 2018-01-17 NOTE — Telephone Encounter (Signed)
Noted  

## 2018-01-17 NOTE — Telephone Encounter (Signed)
Behavioral case manager at [facility] [employer] [facility] just called to say that patient has been taking all medications according to providers instructions Please follow up if you have any questions or concerns

## 2018-01-27 ENCOUNTER — Ambulatory Visit: Payer: Medicaid Other | Attending: Neurosurgery | Admitting: Physical Therapy

## 2018-01-27 DIAGNOSIS — M6281 Muscle weakness (generalized): Secondary | ICD-10-CM | POA: Insufficient documentation

## 2018-01-27 DIAGNOSIS — M79602 Pain in left arm: Secondary | ICD-10-CM | POA: Insufficient documentation

## 2018-01-27 DIAGNOSIS — G540 Brachial plexus disorders: Secondary | ICD-10-CM | POA: Insufficient documentation

## 2018-01-27 IMAGING — CT CT CERVICAL SPINE W/O CM
4 of 8 series · 13 of 33 positions shown, 14 images · non-contrast
Comparison: CT HEAD September 12, 2015 and MRI of the cervical spine
January 27, 2017

CLINICAL DATA: Fall, seizure like activity at clinic. Elevated INR.
Altered mental status. History of migraines, stroke, angina bloody
seen a.m.

EXAM:
CT HEAD WITHOUT CONTRAST
CT CERVICAL SPINE WITHOUT CONTRAST
TECHNIQUE: Multidetector CT imaging of the head and cervical spine was
performed following the standard protocol without intravenous
contrast. Multiplanar CT image reconstructions of the cervical spine
were also generated.

[Series 6: head 3.0 mpr cor · coronal · 0.30mm/px · 3 of 67 slices shown]
[im 17/67  bone]
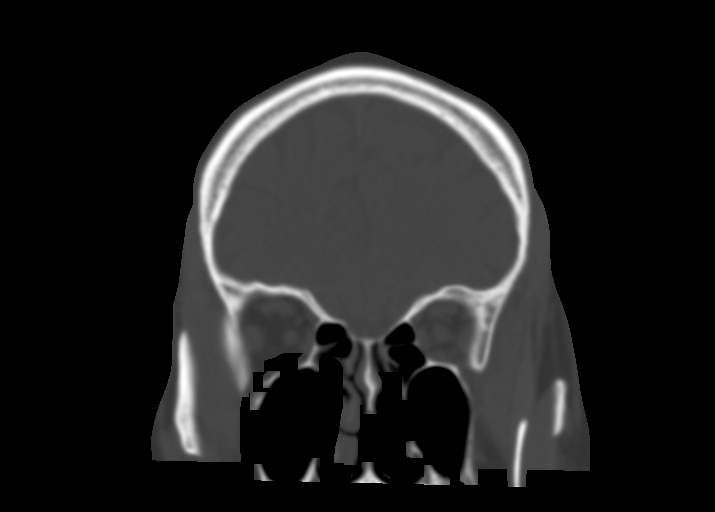
[im 34/67  bone]
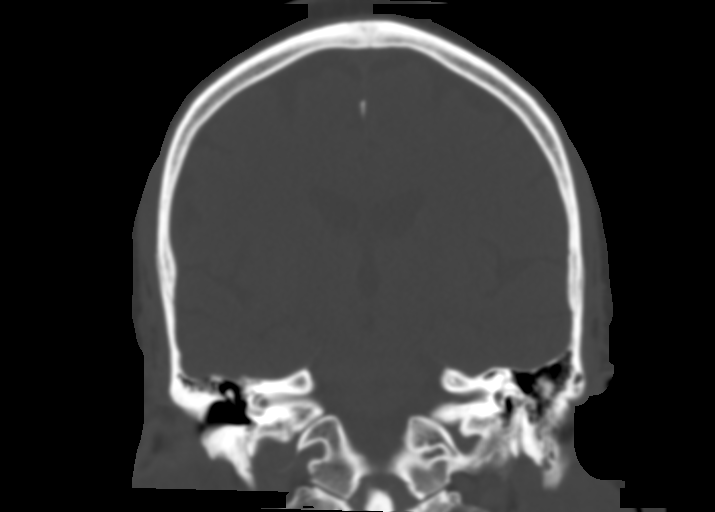
[im 50/67  bone]
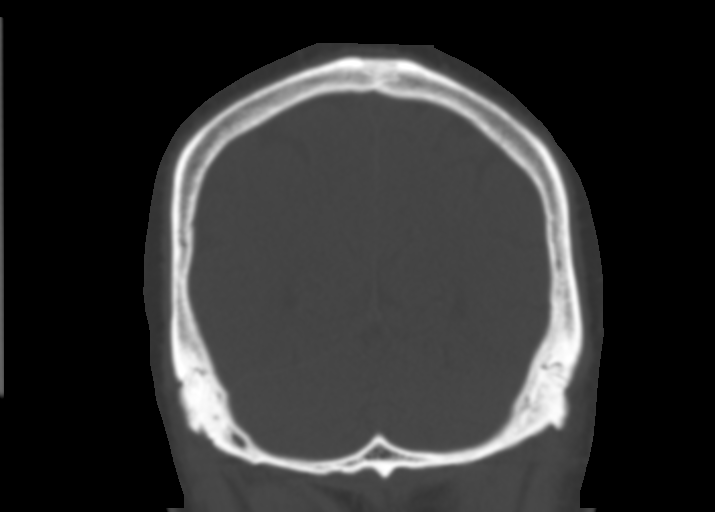

[Series 9: c_spine 2.0 st · axial · 0.38mm/px · z∈[-234,-124]mm · 3 of 111 slices shown, 4 images]
[im 28/111  soft-tissue]
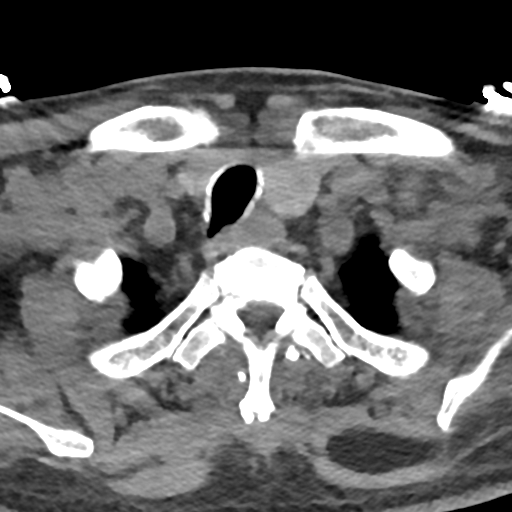
[im 28/111  bone]
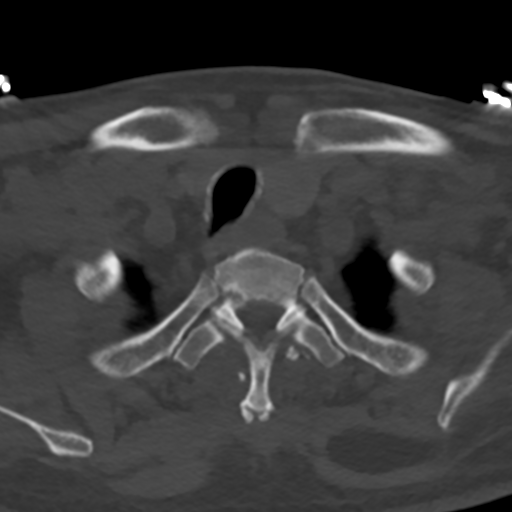
[im 56/111  bone]
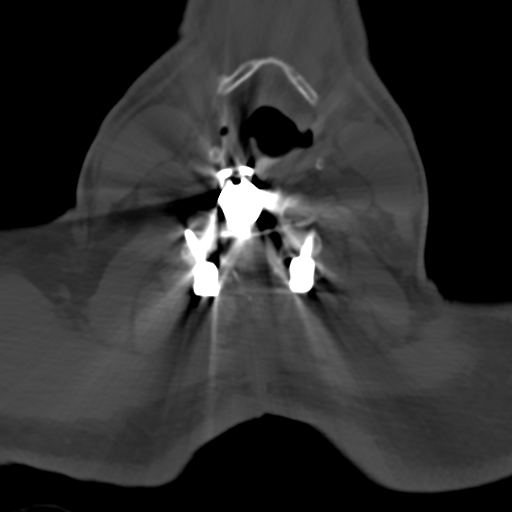
[im 83/111  bone]
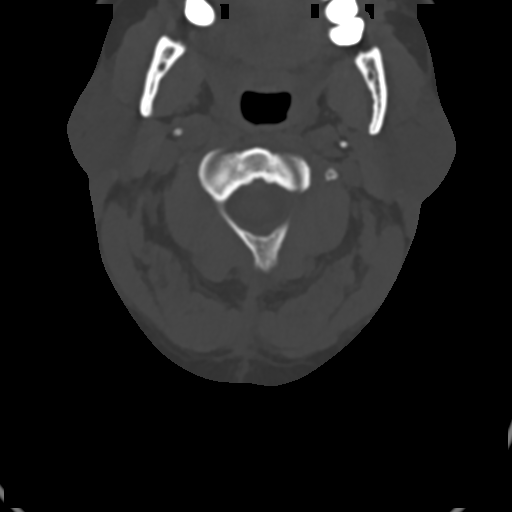

[Series 13: orthogonal axial st · axial · 0.21mm/px · z∈[-254,-161]mm · 3 of 110 slices shown]
[im 28/110  bone]
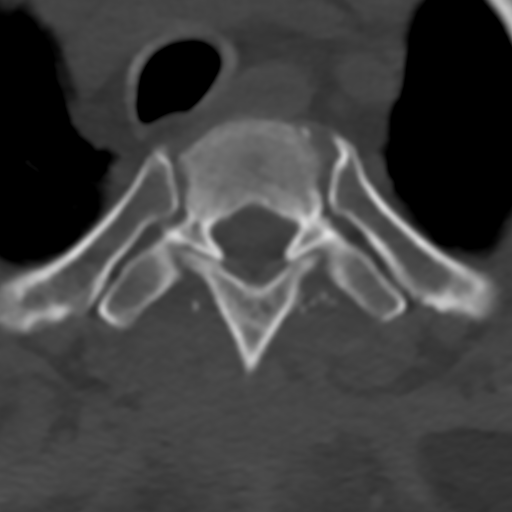
[im 55/110  bone]
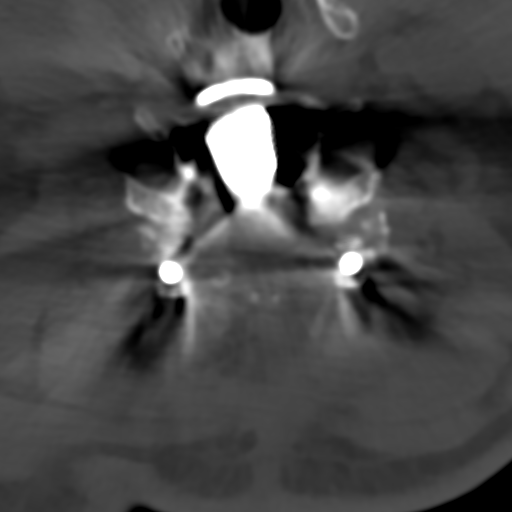
[im 82/110  bone]
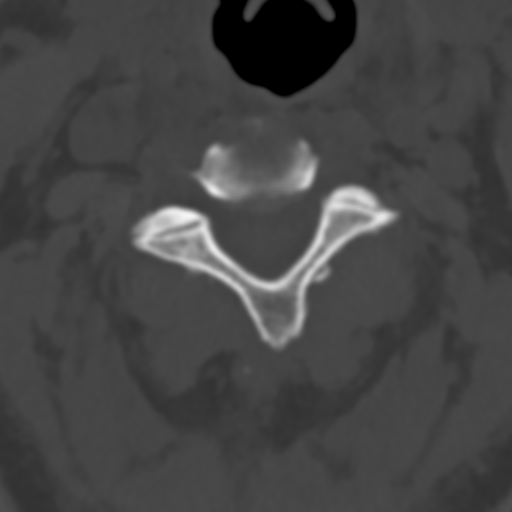

[Series 15: sagittal bone · sagittal · 0.32mm/px · 4 of 61 slices shown]
[im 13/61  bone]
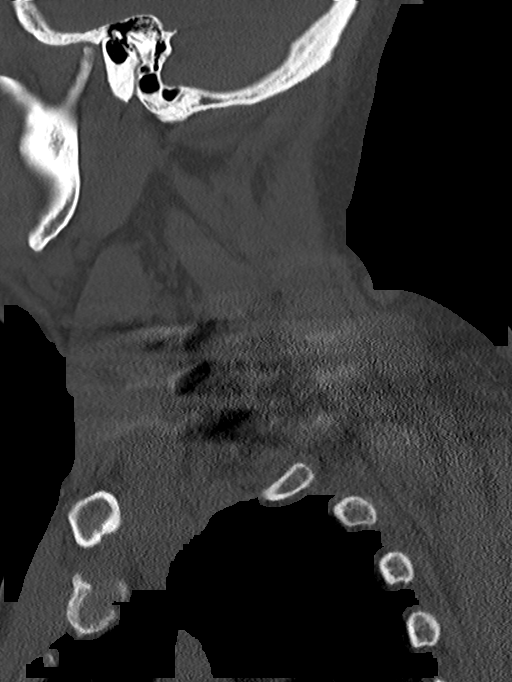
[im 25/61  bone]
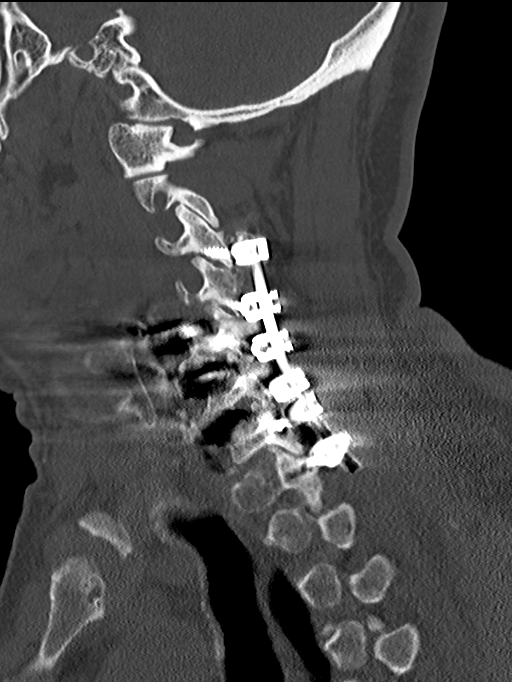
[im 37/61  bone]
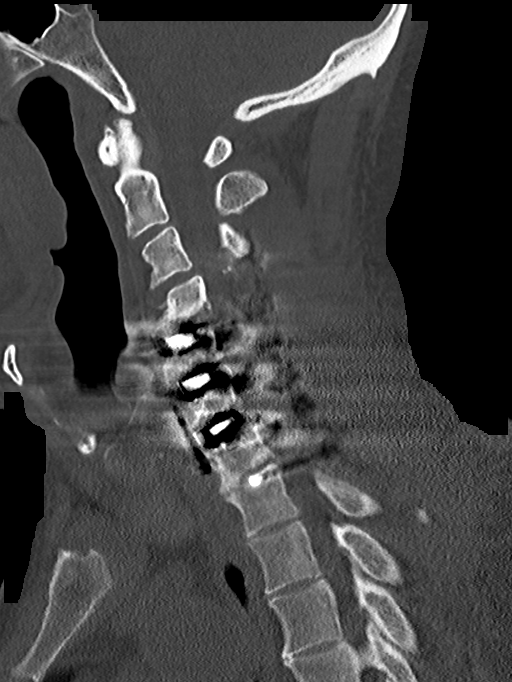
[im 49/61  bone]
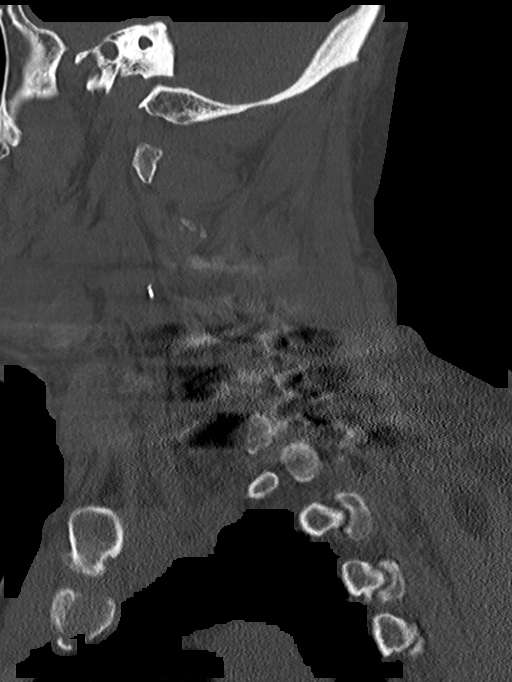

[13 of 33 positions shown; findings below may reference images not displayed]

FINDINGS: CT HEAD FINDINGS

BRAIN: No intraparenchymal hemorrhage, mass effect nor midline
shift. The ventricles and sulci are normal for age. Patchy
supratentorial white matter hypodensities . No acute large vascular
territory infarcts. No abnormal extra-axial fluid collections. Basal
cisterns are patent.

VASCULAR: Minimal calcific atherosclerosis of the carotid siphons.

SKULL: No skull fracture. No significant scalp soft tissue swelling.

SINUSES/ORBITS: Mild paranasal sinus mucosal thickening. Mastoid air
cells are well aerated.The included ocular globes and orbital
contents are non-suspicious.

OTHER: None.

CT CERVICAL SPINE FINDINGS

ALIGNMENT: Maintained lordosis. Vertebral bodies in alignment.

SKULL BASE AND VERTEBRAE: Cervical vertebral bodies are intact.
Status post C4 through C7 ACDF, disc prosthesis result in beam
hardening artifact. Status post C4 through C7 laminectomies with
intact facet screws. RIGHT C3-4 screw traverses the facet joint.
Predominately reabsorbed posterior bone graft material without
arthrodesis. No destructive bony lesions.

SOFT TISSUES AND SPINAL CANAL: Limited assessment due to hardware
artifact. Mild calcific atherosclerosis LEFT carotid bifurcation.

DISC LEVELS: Limited assessment due to hardware artifact. No canal
stenosis.

UPPER CHEST: Lung apices are clear.

OTHER: None.
IMPRESSION: CTA HEAD:

1. No acute intracranial process.
2. Mild chronic small vessel ischemic disease.
CT cervical spine:

1. No acute fracture or malalignment.
2. Status post C4 through C7 ACDF and laminectomies, C3 through C7
facet screws.

## 2018-01-27 NOTE — Therapy (Signed)
[facility] Outpatient Rehabilitation [facility] [address] [place], [place], [address] Phone: [phone]   Fax:  [phone]  Physical Therapy Treatment  Patient Details  Name: Russell Carter MRN: [id number] Date of Birth: 07-24-1956 Referring Provider: Maeola Harman, MD   Encounter Date: 01/27/2018  PT End of Session - 01/27/18 1421    Visit Number  2    Number of Visits  4    Date for PT Re-Evaluation  02/14/18    Authorization Type  MCD auth 3 visits 4/7/5/4    Authorization - Visit Number  1    Authorization - Number of Visits  3    PT Start Time  1451    PT Stop Time  1532    PT Time Calculation (min)  41 min    Activity Tolerance  Patient tolerated treatment well    Behavior During Therapy  [facility] for tasks assessed/performed       Past Medical History:  Diagnosis Date  . Anginal pain (HCC)   . Arthritis   . Blood in stool   . CAD (coronary artery disease)    a. 2011: cath showing mod non-obstructive disease b. 02/2016: cath with 95% stenosis in the Mid RCA. DES placed.  . Cervical stenosis of spine   . Cocaine abuse (HCC)    history  . Depression   . Diabetes mellitus without complication (HCC)   . Gout, unspecified   . Headache   . HTN (hypertension)   . Hypercholesterolemia   . Hyperglycemia   . Migraines   . Myocardial infarction [facility]) 2008   drug cocaine and marijuana at time of mi none  since    . Pneumonia   . Renal insufficiency   . Stroke [facility])     Past Surgical History:  Procedure Laterality Date  . ANTERIOR CERVICAL DECOMP/DISCECTOMY FUSION N/A 01/24/2017   Procedure: Cervical Four-Five,Cervical Five Six,Cervical Six-Seven Anterior cervical discectomy with fusion and plate with Cervical Three-Thoracic- One Laminectomy/Foraminotomy with Cervical Three-Thoracic- One Fixation and fusion;  Surgeon: Loura Halt Ditty, MD;  Location: [facility] OR;  Service: Neurosurgery;  Laterality: N/A;  Part-1 anterior approach  . CARDIAC CATHETERIZATION   2008; ~ 2011   [place]-40%lad  . CARDIAC CATHETERIZATION N/A 03/16/2016   Procedure: Left Heart Cath and Coronary Angiography;  Surgeon: Marykay Lex, MD;  Location: [facility] INVASIVE CV LAB;  Service: Cardiovascular;  Laterality: N/A;  . CORONARY ANGIOPLASTY     STENTS  . EYE SURGERY Bilateral    "for double vision"  . INGUINAL HERNIA REPAIR  05/12/2012   Procedure: HERNIA REPAIR INGUINAL ADULT;  Surgeon: Liz Malady, MD;  Location: [facility];  Service: General;  Laterality: Right;  . INGUINAL HERNIA REPAIR Left 02/02/2003   Dr. Violeta Gelinas. repair with mesh. Same procedure done again in  05/22/2004   . INGUINAL HERNIA REPAIR Left ?date 2nd OR  . POSTERIOR CERVICAL FUSION/FORAMINOTOMY N/A 01/24/2017   Procedure: Cervical Three-Thoracic One-Laminectomy/Foraminotomy with Cervical Three-Thoracic One Fixation and fusion;  Surgeon: Loura Halt Ditty, MD;  Location: [facility] OR;  Service: Neurosurgery;  Laterality: N/A;  Part-2 Posterior approach  . RECONSTRUCT / STABILIZE DISTAL ULNA    . SHOULDER OPEN ROTATOR CUFF REPAIR Left 2010  . SHOULDER SURGERY Left    "injured on the job; had to cut small bone out"  . THUMB FUSION Right     There were no vitals filed for this visit.  Subjective Assessment - 01/27/18 1451    Subjective  About  an 8/10, it is a sharp, stabbing pain going down spine to mid thoracic but not so much into shoulder today. More under shoulder blade.     Currently in Pain?  Yes    Pain Score  8     Pain Location  Scapula    Pain Orientation  Left    Pain Descriptors / Indicators  Stabbing                       [facility] Adult PT Treatment/Exercise - 01/27/18 0001      Shoulder Exercises: Supine   Flexion  Other (comment) isometric holds at 90 flx    Other Supine Exercises  chest press with wand    Other Supine Exercises  flexion with wand      Shoulder Exercises: Seated   Retraction  10 reps cervical    External Rotation  20 reps;Theraband 2  sets    Theraband Level (Shoulder External Rotation)  Level 1 (Yellow)    Other Seated Exercises  scapular retraction      Shoulder Exercises: Standing   Row  20 reps;Theraband    Theraband Level (Shoulder Row)  Level 3 (Green)      Manual Therapy   Manual Therapy  Soft tissue mobilization    Manual therapy comments  edu in use of tennis ball for trigger point release    Soft tissue mobilization  IASTM, STM & trigger point release Lt levator scap & paraspinals-upper cervical to mid thoracic               PT Short Term Goals - 01/15/18 1606      PT SHORT TERM GOAL #1   Title  Pt will be able to use scapular retraction through the day to provide support to postural alignment     Baseline  began educating at eval    Time  4    Period  Weeks    Status  New    Target Date  02/14/18      PT SHORT TERM GOAL #2   Title  Pt will demo proper form with HEP for necessary challenge to joint ROM and muscular activation    Baseline  will progress as appropriate    Time  4    Period  Weeks    Status  New    Target Date  02/14/18        PT Long Term Goals - 01/15/18 1608      PT LONG TERM GOAL #1   Title  Pt will be able to dress independently with minimal difficulty    Baseline  severe difficulty with severe pain at eval    Time  14    Period  Weeks    Status  New    Target Date  04/25/18      PT LONG TERM GOAL #2   Title  Average pain <=5/10 to decrease pain limitations on daily activities    Baseline  10/10 at eval    Time  14    Period  Weeks    Status  New    Target Date  04/25/18      PT LONG TERM GOAL #3   Title  Pt will be able to use Left arm to cook for himself for return to independence    Baseline  unable, Mom does most of the cooking because he is unable to lift pots/pans/plates    Time  14  Period  Weeks    Status  New    Target Date  04/25/18      PT LONG TERM GOAL #4   Title  Pt will be able to return to light sporting activities such as  beginning karate and basketball to begin return to PLOF    Baseline  unable at eval    Time  14    Period  Weeks    Status  New    Target Date  04/25/18            Plan - 01/27/18 1510    Clinical Impression Statement  pain centralized to Lt levator scapula & paraspinals, trigger points recreated concordant pain. heavy cues to activate appropriate periscapular musculature. reported feeling looser and pain to 6/10 after treatment.     PT Treatment/Interventions  ADLs/Self Care Home Management;Cryotherapy;Electrical Stimulation;Ultrasound;Moist Heat;Iontophoresis 4mg /ml Dexamethasone;Therapeutic activities;Therapeutic exercise;Patient/family education;Manual techniques;Scar mobilization;Passive range of motion;Taping;Dry needling    PT Next Visit Plan  PROM/AAROM GHJ, periscapular strengthening, STM to cervical/GHJ musculature    PT Home Exercise Plan  PROM elbow & GHJ flexion, scap retraction, cervical rotation+scap retraction, supine iso flx hold at 90, seated ER yellow tband with pillow    Consulted and Agree with Plan of Care  Patient       Patient will benefit from skilled therapeutic intervention in order to improve the following deficits and impairments:  Pain, Improper body mechanics, Postural dysfunction, Increased muscle spasms, Decreased activity tolerance, Decreased range of motion, Decreased strength, Impaired UE functional use  Visit Diagnosis: Pain in left arm  Muscle weakness (generalized)     Problem List Patient Active Problem List   Diagnosis Date Noted  . Left knee pain 01/10/2018  . Dental caries 09/18/2017  . Syncope and collapse 09/04/2017  . Left arm weakness 08/07/2017  . Numbness and tingling in both hands 07/31/2017  . Gastroesophageal reflux disease without esophagitis 07/31/2017  . Moderate episode of recurrent major depressive disorder (HCC) 07/31/2017  . 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 Chronic pain  disorder 06/05/2017  . Supratherapeutic INR 04/27/2017  . Gastrointestinal hemorrhage with melena   . Blood loss anemia 04/25/2017  . Pulmonary embolism (HCC) 02/14/2017  . Fatigue associated with anemia   . Post-operative pain   . Radicular pain   . Diabetes mellitus type 2 in nonobese (HCC)   . Surgery, elective   . Postoperative pain   . Leukocytosis   . Thrombocytopenia (HCC)   . History of myocardial infarction   . Cocaine abuse (HCC)   . Depression   . Type 2 diabetes mellitus with hyperglycemia, without long-term current use of insulin (HCC)   . Stage 3 chronic kidney disease (HCC)   . Benign essential HTN   . History of CVA (cerebrovascular accident)   . Prediabetes   . Cervical spondylosis with radiculopathy 01/24/2017  . Dyslipidemia 03/15/2016  . Unstable angina (HCC) - chest pain with complete heart block & chest pain 03/15/2016  . Complete heart block (HCC) 03/14/2016  . Cervical spinal stenosis 03/14/2016  . Chest pain with moderate risk of acute coronary syndrome 03/13/2016  . CAD-50% LAD 2011 12/14/2014  . Gout 11/16/2014  . Acute intractable headache 07/15/2013  . Polysubstance abuse (HCC) 01/15/2013  . DIZZINESS 06/05/2010  . Essential hypertension 05/18/2009  . Acute myocardial infarction (HCC) 05/18/2009  . MUSCLE SPASM 05/18/2009    Darianne Muralles C. Juquan Reznick PT, DPT 01/27/18 3:37 PM   [facility] [address] [place], [place], [address] Phone: [phone]   Fax:  [phone]  Name: Russell Carter MRN: [id number] Date of Birth: 08/10/1956

## 2018-01-29 ENCOUNTER — Ambulatory Visit: Payer: Medicaid Other | Attending: Internal Medicine | Admitting: Pharmacist

## 2018-01-29 DIAGNOSIS — Z7901 Long term (current) use of anticoagulants: Secondary | ICD-10-CM | POA: Insufficient documentation

## 2018-01-29 DIAGNOSIS — I2782 Chronic pulmonary embolism: Secondary | ICD-10-CM

## 2018-01-29 LAB — POCT INR: INR: 2.9

## 2018-01-29 MED ORDER — WARFARIN SODIUM 5 MG PO TABS: 5.0000 mg | ORAL_TABLET | Freq: Every day | ORAL | 3 refills | Status: DC

## 2018-01-29 MED FILL — WARFARIN SODIUM 5 MG TABLET: 5 | 30 days supply | Qty: 30 | Fill #0

## 2018-01-29 NOTE — Patient Instructions (Addendum)
Thank you for coming to see me today. Please continue taking 1 tablet daily. I will see you for a follow-up in 2 weeks.

## 2018-01-30 ENCOUNTER — Ambulatory Visit: Payer: Medicaid Other | Admitting: Physical Therapy

## 2018-01-30 ENCOUNTER — Encounter: Payer: Self-pay | Admitting: Physical Therapy

## 2018-01-30 DIAGNOSIS — M6281 Muscle weakness (generalized): Secondary | ICD-10-CM

## 2018-01-30 DIAGNOSIS — M79602 Pain in left arm: Secondary | ICD-10-CM

## 2018-01-30 NOTE — Therapy (Signed)
[facility] Outpatient Rehabilitation [facility] [address] [place], [place], [address] Phone: [phone]   Fax:  [phone]  Physical Therapy Treatment  Patient Details  Name: Russell Carter MRN: [id number] Date of Birth: 1956-06-22 Referring Provider: Maeola Harman, MD   Encounter Date: 01/30/2018  PT End of Session - 01/30/18 1323    Visit Number  3    Number of Visits  4    Date for PT Re-Evaluation  02/14/18    Authorization Type  MCD auth 3 visits 4/7/5/4    Authorization - Visit Number  2    Authorization - Number of Visits  3    PT Start Time  1323    PT Stop Time  1404    PT Time Calculation (min)  41 min    Activity Tolerance  Patient tolerated treatment well    Behavior During Therapy  [facility] for tasks assessed/performed       Past Medical History:  Diagnosis Date  . Anginal pain (HCC)   . Arthritis   . Blood in stool   . CAD (coronary artery disease)    a. 2011: cath showing mod non-obstructive disease b. 02/2016: cath with 95% stenosis in the Mid RCA. DES placed.  . Cervical stenosis of spine   . Cocaine abuse (HCC)    history  . Depression   . Diabetes mellitus without complication (HCC)   . Gout, unspecified   . Headache   . HTN (hypertension)   . Hypercholesterolemia   . Hyperglycemia   . Migraines   . Myocardial infarction [facility]) 2008   drug cocaine and marijuana at time of mi none  since    . Pneumonia   . Renal insufficiency   . Stroke [facility])     Past Surgical History:  Procedure Laterality Date  . ANTERIOR CERVICAL DECOMP/DISCECTOMY FUSION N/A 01/24/2017   Procedure: Cervical Four-Five,Cervical Five Six,Cervical Six-Seven Anterior cervical discectomy with fusion and plate with Cervical Three-Thoracic- One Laminectomy/Foraminotomy with Cervical Three-Thoracic- One Fixation and fusion;  Surgeon: Loura Halt Ditty, MD;  Location: [facility] OR;  Service: Neurosurgery;  Laterality: N/A;  Part-1 anterior approach  . CARDIAC CATHETERIZATION   2008; ~ 2011   [place]-40%lad  . CARDIAC CATHETERIZATION N/A 03/16/2016   Procedure: Left Heart Cath and Coronary Angiography;  Surgeon: Marykay Lex, MD;  Location: [facility] INVASIVE CV LAB;  Service: Cardiovascular;  Laterality: N/A;  . CORONARY ANGIOPLASTY     STENTS  . EYE SURGERY Bilateral    "for double vision"  . INGUINAL HERNIA REPAIR  05/12/2012   Procedure: HERNIA REPAIR INGUINAL ADULT;  Surgeon: Liz Malady, MD;  Location: [facility];  Service: General;  Laterality: Right;  . INGUINAL HERNIA REPAIR Left 02/02/2003   Dr. Violeta Gelinas. repair with mesh. Same procedure done again in  05/22/2004   . INGUINAL HERNIA REPAIR Left ?date 2nd OR  . POSTERIOR CERVICAL FUSION/FORAMINOTOMY N/A 01/24/2017   Procedure: Cervical Three-Thoracic One-Laminectomy/Foraminotomy with Cervical Three-Thoracic One Fixation and fusion;  Surgeon: Loura Halt Ditty, MD;  Location: [facility] OR;  Service: Neurosurgery;  Laterality: N/A;  Part-2 Posterior approach  . RECONSTRUCT / STABILIZE DISTAL ULNA    . SHOULDER OPEN ROTATOR CUFF REPAIR Left 2010  . SHOULDER SURGERY Left    "injured on the job; had to cut small bone out"  . THUMB FUSION Right     There were no vitals filed for this visit.  Subjective Assessment - 01/30/18 1324    Subjective  More  sore today rather than stabbing. I moved furniture all day yesterday.     Patient Stated Goals  decrease pain, play basketball/tennis, lift weights, armed securty, karate, dress/cook independently    Currently in Pain?  Yes    Pain Score  7     Pain Location  Neck    Pain Orientation  Left    Pain Descriptors / Indicators  Sore    Pain Radiating Towards  to scapula                       [facility] Adult PT Treatment/Exercise - 01/30/18 0001      Shoulder Exercises: Seated   Other Seated Exercises  cervical rotation with scapular retraction      Shoulder Exercises: Prone   Retraction  15 reps 5s holds    Extension  Both;10 reps  retraction + extension    Other Prone Exercises  Qped weight shifting      Shoulder Exercises: Standing   Other Standing Exercises  hands on table step outs with yellow tband    Other Standing Exercises  triceps push ups on table      Shoulder Exercises: Pulleys   Flexion  2 minutes      Manual Therapy   Soft tissue mobilization  IASTM, STM Lt upper trap, levator, periscapular region, scalenes, SCM             PT Education - 01/30/18 1404    Education provided  Yes    Education Details  limited ROM with fusions, overactivation/misuse of upper trap resulting in pain, bite guard to reduce night time grinding, rest breaks    Person(s) Educated  Patient    Methods  Explanation    Comprehension  Verbalized understanding;Need further instruction       PT Short Term Goals - 01/15/18 1606      PT SHORT TERM GOAL #1   Title  Pt will be able to use scapular retraction through the day to provide support to postural alignment     Baseline  began educating at eval    Time  4    Period  Weeks    Status  New    Target Date  02/14/18      PT SHORT TERM GOAL #2   Title  Pt will demo proper form with HEP for necessary challenge to joint ROM and muscular activation    Baseline  will progress as appropriate    Time  4    Period  Weeks    Status  New    Target Date  02/14/18        PT Long Term Goals - 01/15/18 1608      PT LONG TERM GOAL #1   Title  Pt will be able to dress independently with minimal difficulty    Baseline  severe difficulty with severe pain at eval    Time  14    Period  Weeks    Status  New    Target Date  04/25/18      PT LONG TERM GOAL #2   Title  Average pain <=5/10 to decrease pain limitations on daily activities    Baseline  10/10 at eval    Time  14    Period  Weeks    Status  New    Target Date  04/25/18      PT LONG TERM GOAL #3   Title  Pt will be able to  use Left arm to cook for himself for return to independence    Baseline  unable, Mom  does most of the cooking because he is unable to lift pots/pans/plates    Time  14    Period  Weeks    Status  New    Target Date  04/25/18      PT LONG TERM GOAL #4   Title  Pt will be able to return to light sporting activities such as beginning karate and basketball to begin return to PLOF    Baseline  unable at eval    Time  14    Period  Weeks    Status  New    Target Date  04/25/18            Plan - 01/30/18 1405    Clinical Impression Statement  Pt reports moving all day yesterday resulting in increased neck/shoulder pain. Also reports needing to move and rub TMJs in the morning to begin moving because he grinds his teeth. He does not see a dentist regularly so I suggested an over the counter bite guard. Reported feeling more relaxed after manual therapy and practicing available ROM of cerivcal region- was activating upper trap in attempt to increase ROM.     PT Treatment/Interventions  ADLs/Self Care Home Management;Cryotherapy;Electrical Stimulation;Ultrasound;Moist Heat;Iontophoresis 4mg /ml Dexamethasone;Therapeutic activities;Therapeutic exercise;Patient/family education;Manual techniques;Scar mobilization;Passive range of motion;Taping;Dry needling    PT Next Visit Plan  MCD ERO**    did he get a bite guard?  door pec stretch, lifting technique    PT Home Exercise Plan  PROM elbow & GHJ flexion, scap retraction, cervical rotation+scap retraction, supine iso flx hold at 90, seated ER yellow tband with pillow    Consulted and Agree with Plan of Care  Patient       Patient will benefit from skilled therapeutic intervention in order to improve the following deficits and impairments:  Pain, Improper body mechanics, Postural dysfunction, Increased muscle spasms, Decreased activity tolerance, Decreased range of motion, Decreased strength, Impaired UE functional use  Visit Diagnosis: Pain in left arm  Muscle weakness (generalized)     Problem List Patient Active Problem  List   Diagnosis Date Noted  . Left knee pain 01/10/2018  . Dental caries 09/18/2017  . Syncope and collapse 09/04/2017  . Left arm weakness 08/07/2017  . Numbness and tingling in both hands 07/31/2017  . Gastroesophageal reflux disease without esophagitis 07/31/2017  . Moderate episode of recurrent major depressive disorder (HCC) 07/31/2017  . 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 Chronic pain disorder 06/05/2017  . Supratherapeutic INR 04/27/2017  . Gastrointestinal hemorrhage with melena   . Blood loss anemia 04/25/2017  . Pulmonary embolism (HCC) 02/14/2017  . Fatigue associated with anemia   . Post-operative pain   . Radicular pain   . Diabetes mellitus type 2 in nonobese (HCC)   . Surgery, elective   . Postoperative pain   . Leukocytosis   . Thrombocytopenia (HCC)   . History of myocardial infarction   . Cocaine abuse (HCC)   . Depression   . Type 2 diabetes mellitus with hyperglycemia, without long-term current use of insulin (HCC)   . Stage 3 chronic kidney disease (HCC)   . Benign essential HTN   . History of CVA (cerebrovascular accident)   . Prediabetes   . Cervical spondylosis with radiculopathy 01/24/2017  . Dyslipidemia 03/15/2016  . Unstable angina (HCC) - chest pain with complete heart block & chest  pain 03/15/2016  . Complete heart block (HCC) 03/14/2016  . Cervical spinal stenosis 03/14/2016  . Chest pain with moderate risk of acute coronary syndrome 03/13/2016  . CAD-50% LAD 2011 12/14/2014  . Gout 11/16/2014  . Acute intractable headache 07/15/2013  . Polysubstance abuse (HCC) 01/15/2013  . DIZZINESS 06/05/2010  . Essential hypertension 05/18/2009  . Acute myocardial infarction (HCC) 05/18/2009  . MUSCLE SPASM 05/18/2009    Russell Carter C. Lylah Lantis PT, DPT 01/30/18 2:08 PM   [facility] Outpatient Rehabilitation [facility] [address] [place], [place], [address] Phone: [phone]   Fax:   [phone]  Name: Gal Lefton MRN: [id number] Date of Birth: 29-Oct-1955

## 2018-02-03 ENCOUNTER — Ambulatory Visit: Payer: Medicaid Other | Admitting: Physical Therapy

## 2018-02-03 ENCOUNTER — Other Ambulatory Visit: Payer: Self-pay | Admitting: Pharmacist

## 2018-02-03 ENCOUNTER — Encounter: Payer: Self-pay | Admitting: Physical Therapy

## 2018-02-03 DIAGNOSIS — M79602 Pain in left arm: Secondary | ICD-10-CM

## 2018-02-03 DIAGNOSIS — G540 Brachial plexus disorders: Secondary | ICD-10-CM

## 2018-02-03 MED ORDER — LOSARTAN POTASSIUM 100 MG PO TABS: 100.0000 mg | ORAL_TABLET | Freq: Every day | ORAL | 0 refills | Status: DC

## 2018-02-03 NOTE — Therapy (Signed)
[facility] [address] [name], [place], [address] Phone: [phone]   Fax:  [phone]  Physical Therapy Treatment/ERO  Patient Details  Name: Russell Carter MRN: [id number] Date of Birth: 26-Mar-1956 Referring Provider: Maeola Harman, MD   Encounter Date: 02/03/2018  PT End of Session - 02/03/18 1457    Visit Number  4    Number of Visits  20 requesting 16 more visits resulting in total of 20    Date for PT Re-Evaluation  04/11/18    Authorization Type  MCD re Berkley Harvey submitted 4/15    PT Start Time  1457    PT Stop Time  1540    PT Time Calculation (min)  43 min    Activity Tolerance  Patient tolerated treatment well    Behavior During Therapy  [employer] for tasks assessed/performed       Past Medical History:  Diagnosis Date  . Anginal pain (HCC)   . Arthritis   . Blood in stool   . CAD (coronary artery disease)    a. 2011: cath showing mod non-obstructive disease b. 02/2016: cath with 95% stenosis in the Mid RCA. DES placed.  . Cervical stenosis of spine   . Cocaine abuse (HCC)    history  . Depression   . Diabetes mellitus without complication (HCC)   . Gout, unspecified   . Headache   . HTN (hypertension)   . Hypercholesterolemia   . Hyperglycemia   . Migraines   . Myocardial infarction [facility]) 2008   drug cocaine and marijuana at time of mi none  since    . Pneumonia   . Renal insufficiency   . Stroke [facility])     Past Surgical History:  Procedure Laterality Date  . ANTERIOR CERVICAL DECOMP/DISCECTOMY FUSION N/A 01/24/2017   Procedure: Cervical Four-Five,Cervical Five Six,Cervical Six-Seven Anterior cervical discectomy with fusion and plate with Cervical Three-Thoracic- One Laminectomy/Foraminotomy with Cervical Three-Thoracic- One Fixation and fusion;  Surgeon: Loura Halt Ditty, MD;  Location: [facility] OR;  Service: Neurosurgery;  Laterality: N/A;  Part-1 anterior approach  . CARDIAC CATHETERIZATION  2008; ~ 2011   Sentinel-40%lad  . CARDIAC CATHETERIZATION N/A 03/16/2016   Procedure: Left Heart Cath and Coronary Angiography;  Surgeon: Marykay Lex, MD;  Location: [facility] INVASIVE CV LAB;  Service: Cardiovascular;  Laterality: N/A;  . CORONARY ANGIOPLASTY     STENTS  . EYE SURGERY Bilateral    "for double vision"  . INGUINAL HERNIA REPAIR  05/12/2012   Procedure: HERNIA REPAIR INGUINAL ADULT;  Surgeon: Liz Malady, MD;  Location: [facility];  Service: General;  Laterality: Right;  . INGUINAL HERNIA REPAIR Left 02/02/2003   Dr. Violeta Gelinas. repair with mesh. Same procedure done again in  05/22/2004   . INGUINAL HERNIA REPAIR Left ?date 2nd OR  . POSTERIOR CERVICAL FUSION/FORAMINOTOMY N/A 01/24/2017   Procedure: Cervical Three-Thoracic One-Laminectomy/Foraminotomy with Cervical Three-Thoracic One Fixation and fusion;  Surgeon: Loura Halt Ditty, MD;  Location: [facility] OR;  Service: Neurosurgery;  Laterality: N/A;  Part-2 Posterior approach  . RECONSTRUCT / STABILIZE DISTAL ULNA    . SHOULDER OPEN ROTATOR CUFF REPAIR Left 2010  . SHOULDER SURGERY Left    "injured on the job; had to cut small bone out"  . THUMB FUSION Right     There were no vitals filed for this visit.  Subjective Assessment - 02/03/18 1457    Subjective  Overall feeling pretty good. My movement in my arm is getting better  and feels stronger, pain is less- down from 10 to 7 or 8/10. Shaving and bathing are still significantly limited. Has been using tennis ball on shoulder which is helpful.     Patient Stated Goals  decrease pain, play basketball/tennis, lift weights, armed securty, karate, dress/cook independently    Currently in Pain?  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    Referring Provider  Maeola Harman, MD    Onset Date/Surgical Date  01/24/17    Hand Dominance  Right      Sensation   Additional Comments  constant numbness in 3rd & 4th digits      AROM   Left Shoulder Extension  50 Degrees    Left Shoulder Flexion  75 Degrees    Left Shoulder ABduction  44 Degrees    Left Shoulder External Rotation  -- able to reach up to touch rim of glasses    Left Elbow Flexion  -- to 90 from side, thumb up; unable in supination      Strength   Left Hand Grip (lbs)  60      Palpation   Palpation comment  TTP supra & infraspinatus, scalenes & levator                   [facility] Adult PT Treatment/Exercise - 02/03/18 0001      Shoulder Exercises: Supine   Other Supine Exercises  eccentric elbow ext, elbow on towel      Shoulder Exercises: Sidelying   External Rotation  20 reps;10 reps AAROM for first 10      Shoulder Exercises: Standing   Other Standing Exercises  towel slide- weight through arms on counter      Shoulder Exercises: Pulleys   Flexion  3 minutes      Manual Therapy   Soft tissue mobilization  IASTM & STM Lt upper trap, supra & infraspinatus, rhomboids, scalenes, levator             PT Education - 02/03/18 1547    Education provided  Yes    Education Details  goals, POC, HEP, progress & future goals    Person(s) Educated  Patient    Methods  Explanation    Comprehension  Verbalized understanding;Need further instruction       PT Short Term Goals - 02/03/18 1502      PT SHORT TERM GOAL #1   Title  Pt will be able to use scapular retraction through the day to provide support to postural alignment     Baseline  I have learned to sit up and relax which has been comfortable and decreases pain    Status  Achieved      PT SHORT TERM GOAL #2   Title  Pt will demo proper form with HEP for necessary challenge to joint ROM and muscular  activation    Baseline  able to demo    Status  Achieved        PT Long Term Goals - 02/03/18 1504      PT LONG  TERM GOAL #1   Title  Pt will be able to dress independently with minimal difficulty    Baseline  it is getting a little better but I still need some help- overhead dressing; now able to don own pants and belt    Time  14    Period  Weeks    Status  On-going    Target Date  04/11/18 to accomodate for MCD authorization periods      PT LONG TERM GOAL #2   Title  Average pain <=5/10 to decrease pain limitations on daily activities    Baseline  average 7-8/10    Time  14    Period  Weeks    Status  On-going    Target Date  04/11/18      PT LONG TERM GOAL #3   Title  Pt will be able to use Left arm to cook for himself for return to independence    Baseline  lacking strength to lift pots, pans, etc    Time  14    Period  Weeks    Status  New    Target Date  04/11/18      PT LONG TERM GOAL #4   Title  Pt will be able to return to light sporting activities such as beginning karate and basketball to begin return to PLOF    Baseline  unable due to lack of strength and ROM    Time  14    Period  Weeks    Status  On-going    Target Date  04/11/18      PT LONG TERM GOAL #5   Title  Pt will be able to shave independently    Baseline  requires assistance    Time  14    Period  Weeks    Status  On-going    Target Date  04/11/18            Plan - 02/03/18 1542    Clinical Impression Statement  Pt has achieved his STGs and is making progress toward reaching LTGs. Continues to lack functional strength and ROM necessary for achievment of LTGs- see goal baselines for spectifics. Pt has full avail PROM and will continue to benefit from skilled PT to retrain neuromuscular activation to improve functional strength and ROM to achieve LTGs.     PT Frequency  2x / week    PT Duration  8 weeks    PT Treatment/Interventions  ADLs/Self Care Home  Management;Cryotherapy;Electrical Stimulation;Ultrasound;Moist Heat;Iontophoresis 4mg /ml Dexamethasone;Therapeutic activities;Therapeutic exercise;Patient/family education;Manual techniques;Scar mobilization;Passive range of motion;Taping;Dry needling    PT Next Visit Plan  door pec stretch, AAROM for retraining    PT Home Exercise Plan  PROM elbow & GHJ flexion, scap retraction, cervical rotation+scap retraction, supine iso flx hold at 90, seated ER yellow tband with pillow; sidelying ER, supine biceps curl, table towel slide    Consulted and Agree with Plan of Care  Patient       Patient will benefit from skilled therapeutic intervention in order to improve the following deficits and impairments:  Pain, Improper body mechanics, Postural dysfunction, Increased muscle spasms, Decreased activity tolerance, Decreased range of motion, Decreased strength, Impaired UE functional use  Visit Diagnosis: 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 Left knee  pain 01/10/2018  . Dental caries 09/18/2017  . Syncope and collapse 09/04/2017  . Left arm weakness 08/07/2017  . Numbness and tingling in both hands 07/31/2017  . Gastroesophageal reflux disease without esophagitis 07/31/2017  . Moderate episode of recurrent major depressive disorder (HCC) 07/31/2017  . 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 Chronic pain disorder 06/05/2017  . Supratherapeutic INR 04/27/2017  . Gastrointestinal hemorrhage with melena   . Blood loss anemia 04/25/2017  . Pulmonary embolism (HCC) 02/14/2017  . Fatigue associated with anemia   . Post-operative pain   . Radicular pain   . Diabetes mellitus type 2 in nonobese (HCC)   . Surgery, elective   . Postoperative pain   . Leukocytosis   . Thrombocytopenia (HCC)   .  History of myocardial infarction   . Cocaine abuse (HCC)   . Depression   . Type 2 diabetes mellitus with hyperglycemia, without long-term current use of insulin (HCC)   . Stage 3 chronic kidney disease (HCC)   . Benign essential HTN   . History of CVA (cerebrovascular accident)   . Prediabetes   . Cervical spondylosis with radiculopathy 01/24/2017  . Dyslipidemia 03/15/2016  . Unstable angina (HCC) - chest pain with complete heart block & chest pain 03/15/2016  . Complete heart block (HCC) 03/14/2016  . Cervical spinal stenosis 03/14/2016  . Chest pain with moderate risk of acute coronary syndrome 03/13/2016  . CAD-50% LAD 2011 12/14/2014  . Gout 11/16/2014  . Acute intractable headache 07/15/2013  . Polysubstance abuse (HCC) 01/15/2013  . DIZZINESS 06/05/2010  . Essential hypertension 05/18/2009  . Acute myocardial infarction (HCC) 05/18/2009  . MUSCLE SPASM 05/18/2009    Tris Howell C. Zilah Villaflor PT, DPT 02/03/18 7:19 PM   [facility] [address] [place], [place], [address] Phone: [phone]   Fax:  [phone]  Name: Indio Santilli MRN: [id number] Date of Birth: Apr 21, 1956

## 2018-02-05 ENCOUNTER — Other Ambulatory Visit: Payer: Self-pay | Admitting: Internal Medicine

## 2018-02-12 ENCOUNTER — Ambulatory Visit: Payer: Medicaid Other | Admitting: Physical Therapy

## 2018-02-12 ENCOUNTER — Ambulatory Visit: Payer: Self-pay | Admitting: Family Medicine

## 2018-02-12 ENCOUNTER — Other Ambulatory Visit: Payer: Self-pay | Admitting: Internal Medicine

## 2018-02-12 ENCOUNTER — Ambulatory Visit: Payer: Medicaid Other | Attending: Family Medicine | Admitting: Pharmacist

## 2018-02-12 DIAGNOSIS — Z7901 Long term (current) use of anticoagulants: Secondary | ICD-10-CM | POA: Diagnosis not present

## 2018-02-12 DIAGNOSIS — I2782 Chronic pulmonary embolism: Secondary | ICD-10-CM

## 2018-02-12 DIAGNOSIS — I2699 Other pulmonary embolism without acute cor pulmonale: Secondary | ICD-10-CM | POA: Diagnosis present

## 2018-02-12 LAB — POCT INR: INR: 2.9

## 2018-02-12 MED FILL — ALLOPURINOL 300 MG TAB: 300 | 30 days supply | Qty: 30 | Fill #0

## 2018-02-12 MED FILL — LOSARTAN POTASSIUM 100 MG T: 100 | 30 days supply | Qty: 30 | Fill #0

## 2018-02-12 MED FILL — DULoxetine HCL 30 MG CPEP: 30 | 30 days supply | Qty: 60 | Fill #2

## 2018-02-12 MED FILL — GABAPENTIN 400 MG CAPSULE: 400 | 30 days supply | Qty: 60 | Fill #3

## 2018-02-12 MED FILL — FERROUS SULFATE 325 MG TAB: 325 (65 FE) | 30 days supply | Qty: 90 | Fill #3

## 2018-02-12 NOTE — Telephone Encounter (Signed)
Pt request refill on medication methocarbamol (ROBAXIN) 750 MG tablet

## 2018-02-12 NOTE — Patient Instructions (Signed)
Thank you for coming to see me today. Please continue taking your Coumadin at 1 tablet a day. I will see you back in about 3 weeks.

## 2018-02-13 MED FILL — METHOCARBAMOL 750 MG TABS: 750 | 30 days supply | Qty: 90 | Fill #0

## 2018-02-16 IMAGING — CT CT CERVICAL SPINE W/O CM
5 of 8 series · 12 of 33 positions shown, 13 images · non-contrast
Comparison: Head and cervical spine CT 04/25/2017. Cervical spine
MRI 01/27/2017.

CLINICAL DATA: Loss of consciousness and trembling in clinic.
Weakness and numbness.

EXAM:
CT HEAD WITHOUT CONTRAST
CT CERVICAL SPINE WITHOUT CONTRAST
TECHNIQUE: Multidetector CT imaging of the head and cervical spine was
performed following the standard protocol without intravenous
contrast. Multiplanar CT image reconstructions of the cervical spine
were also generated.

[Series 6: head bone · axial · 0.44mm/px · z∈[-61,-3]mm · 2 of 87 slices shown]
[im 29/87  bone]
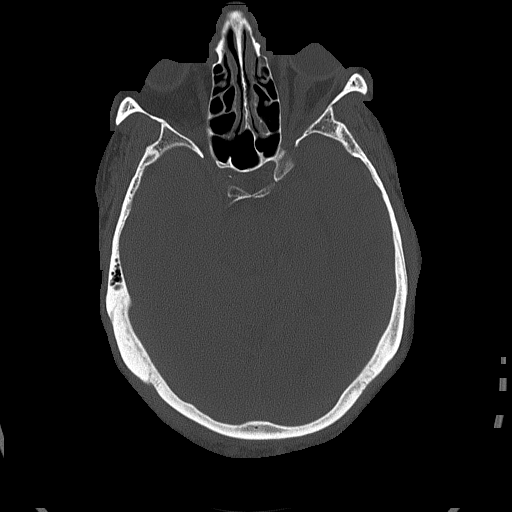
[im 58/87  bone]
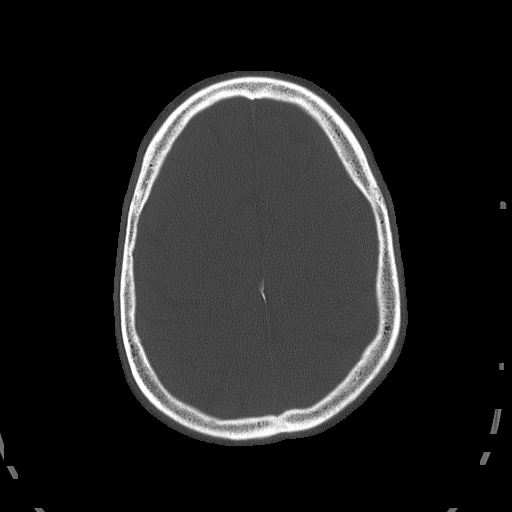

[Series 9: c_spine 2.0 st · axial · 0.39mm/px · z∈[-221,-159]mm · 2 of 94 slices shown, 3 images]
[im 32/94  soft-tissue]
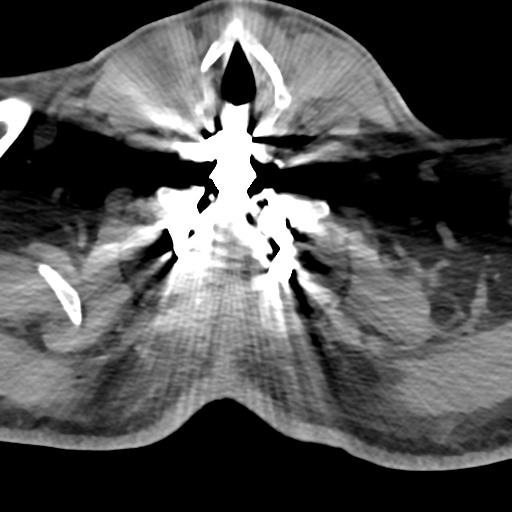
[im 32/94  bone]
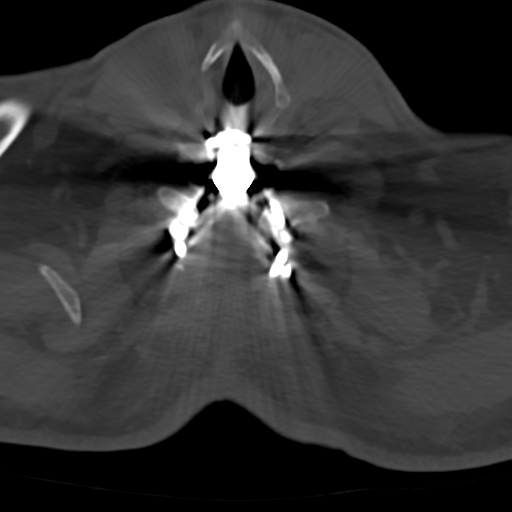
[im 63/94  bone]
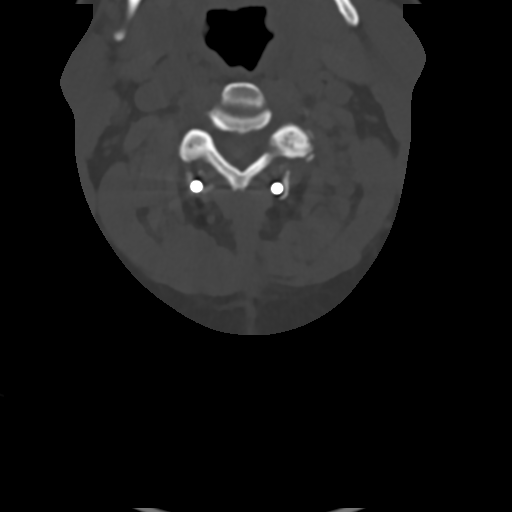

[Series 11: c_spine 2.0 sag bone · sagittal · 0.30mm/px · 5 of 66 slices shown]
[im 11/66  bone]
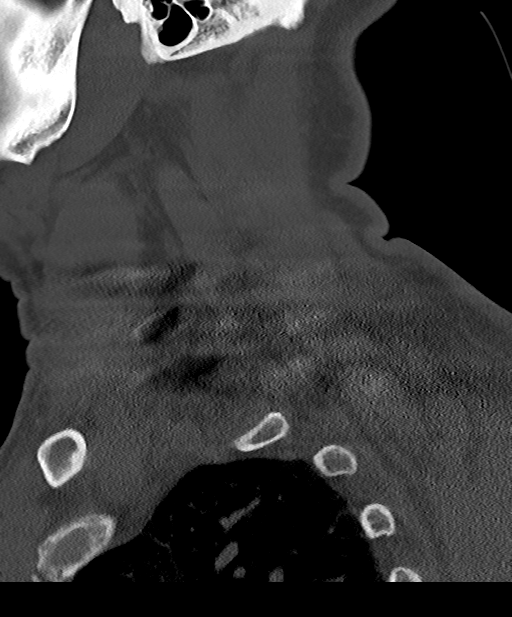
[im 22/66  bone]
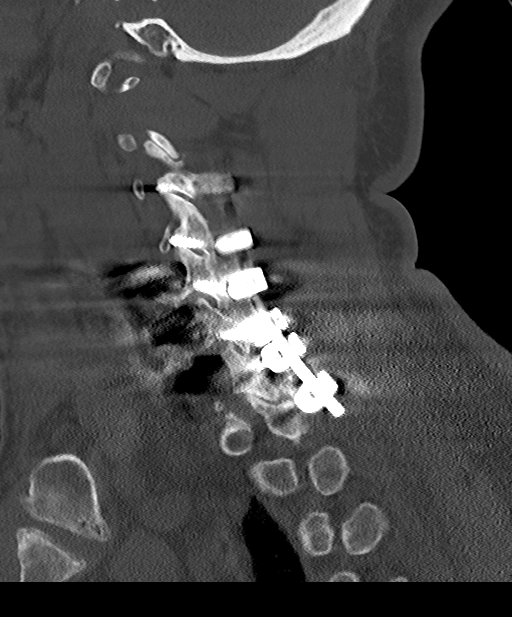
[im 33/66  bone]
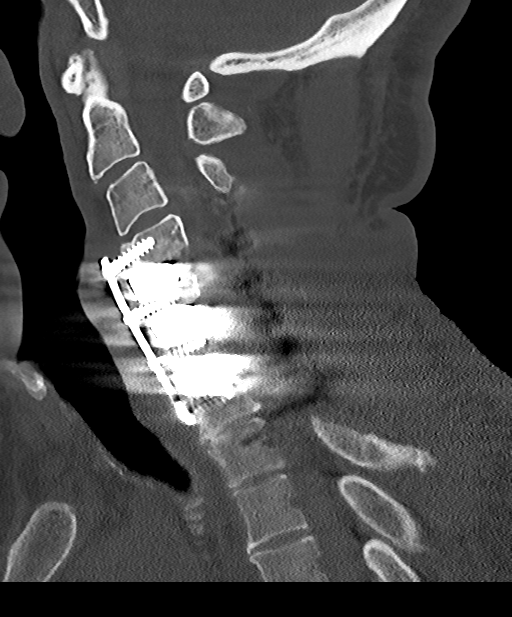
[im 44/66  bone]
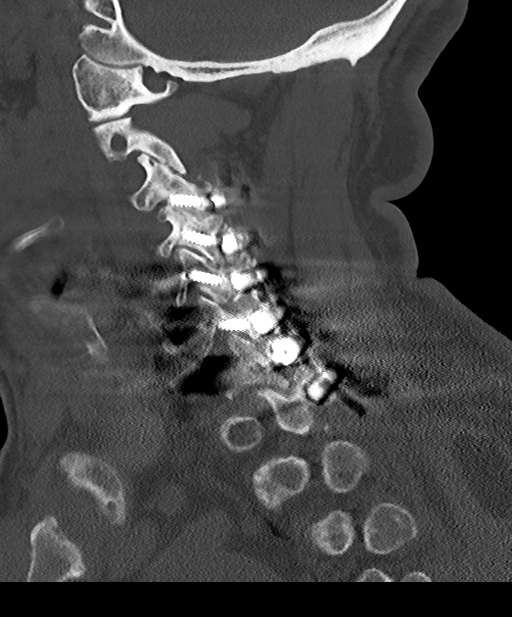
[im 55/66  bone]
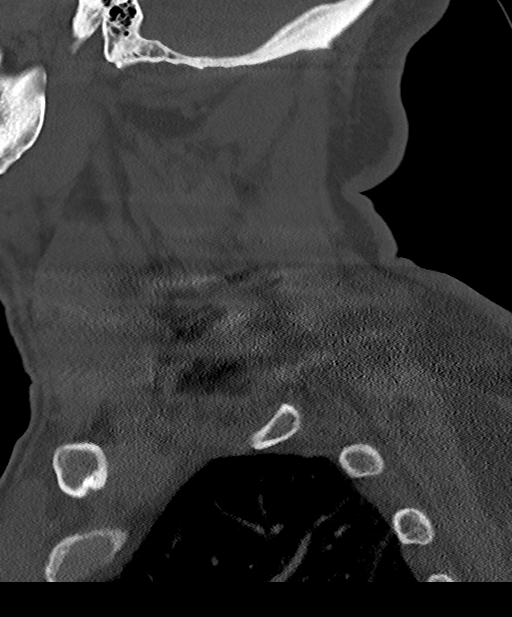

[Series 12: c_spine 2.0 cor bone · coronal · 0.29mm/px · 1 of 79 slices shown]
[im 40/79  bone]
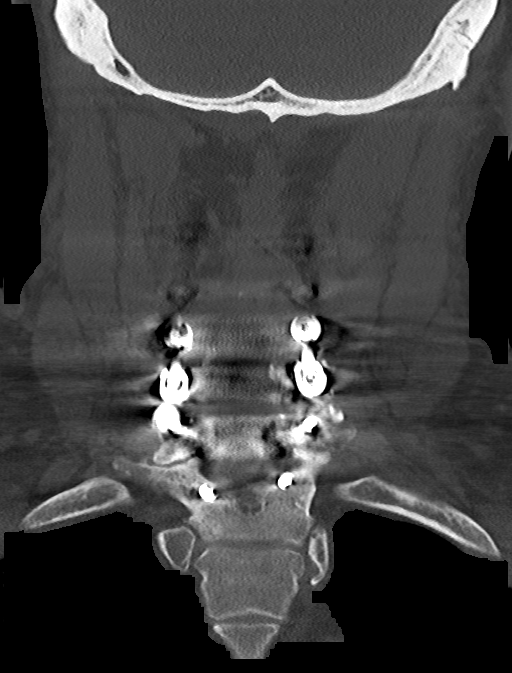

[Series 13: c_spine 2.0 orthogonals · axial · 0.21mm/px · z∈[-244,-188]mm · 2 of 98 slices shown]
[im 33/98  bone]
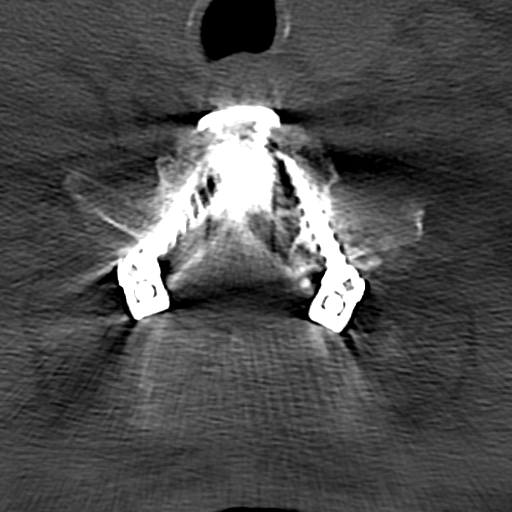
[im 65/98  bone]
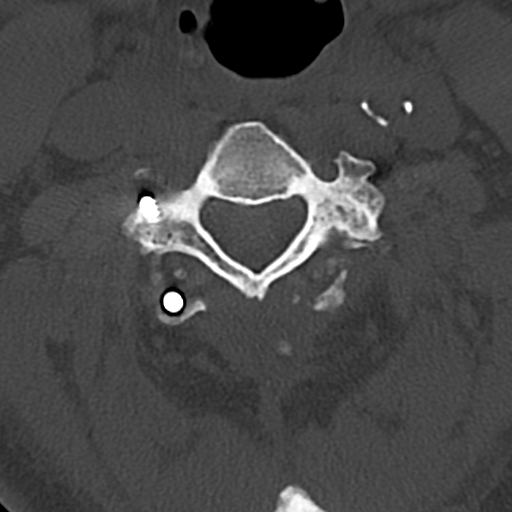

[12 of 33 positions shown; findings below may reference images not displayed]

FINDINGS: CT HEAD FINDINGS

Brain: There is no evidence of acute infarct, intracranial
hemorrhage, mass, midline shift, or extra-axial fluid collection.
The ventricles and sulci are within normal limits for age.
Periventricular white matter hypodensities are similar to the prior
CT and nonspecific but compatible with mild chronic small vessel
ischemic disease.

Vascular: No hyperdense vessel.

Skull: No fracture or focal osseous lesion.

Sinuses/Orbits: Paranasal sinuses and mastoid air cells are clear.
Unremarkable orbits.

Other: None.

CT CERVICAL SPINE FINDINGS

Alignment: Cervical spine straightening.  No listhesis.

Skull base and vertebrae: Sequelae of C4-C7 ACDF are again
identified with associated extensive streak artifact related to the
interbody implants. C3-T1 posterior instrumented fusion is also
again noted. No evidence of screw loosening. There is left facet
ankylosis at C3-4. Solid posterior osseous fusion is not clearly
identified elsewhere. Within limitations of artifact, no acute
fracture or destructive osseous process is identified.

Soft tissues and spinal canal: No gross prevertebral soft tissue
swelling within limitations of artifact.

Disc levels: Anterior and posterior fusion changes as above with
C4-C7 laminectomies. Limited assessment of the spinal canal due to
artifact.

Upper chest: Clear lung apices.

Other: Mild calcified atherosclerosis at the left carotid
bifurcation.
IMPRESSION: 1. No evidence of acute intracranial abnormality.
2. Mild chronic small vessel ischemic disease.
3. Postoperative changes in the cervical spine without evidence of
acute osseous abnormality.

## 2018-02-16 IMAGING — DX DG CHEST 2V
2 series · 2 of 2 positions shown · non-contrast
Comparison: 03/12/2017 .  CT 02/14/2017.

CLINICAL DATA: Chest pain.

EXAM:
CHEST  2 VIEW

[x chest ap]
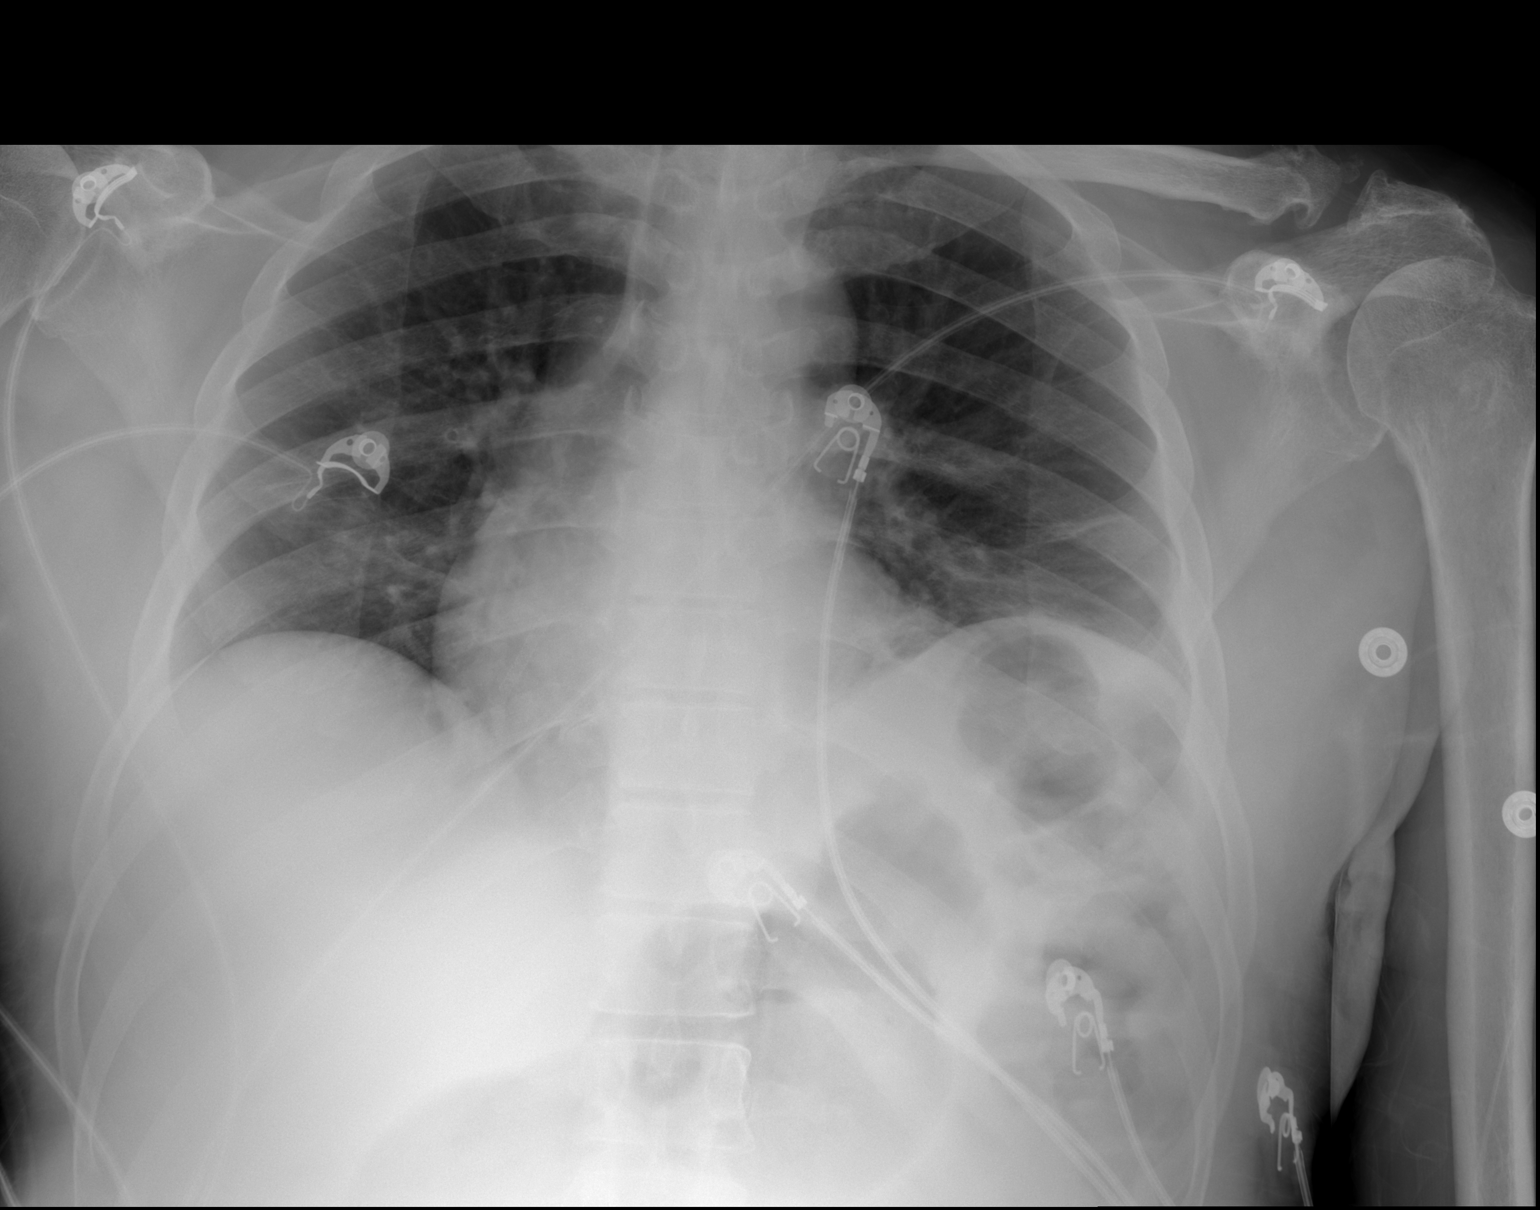

[w chest lat]
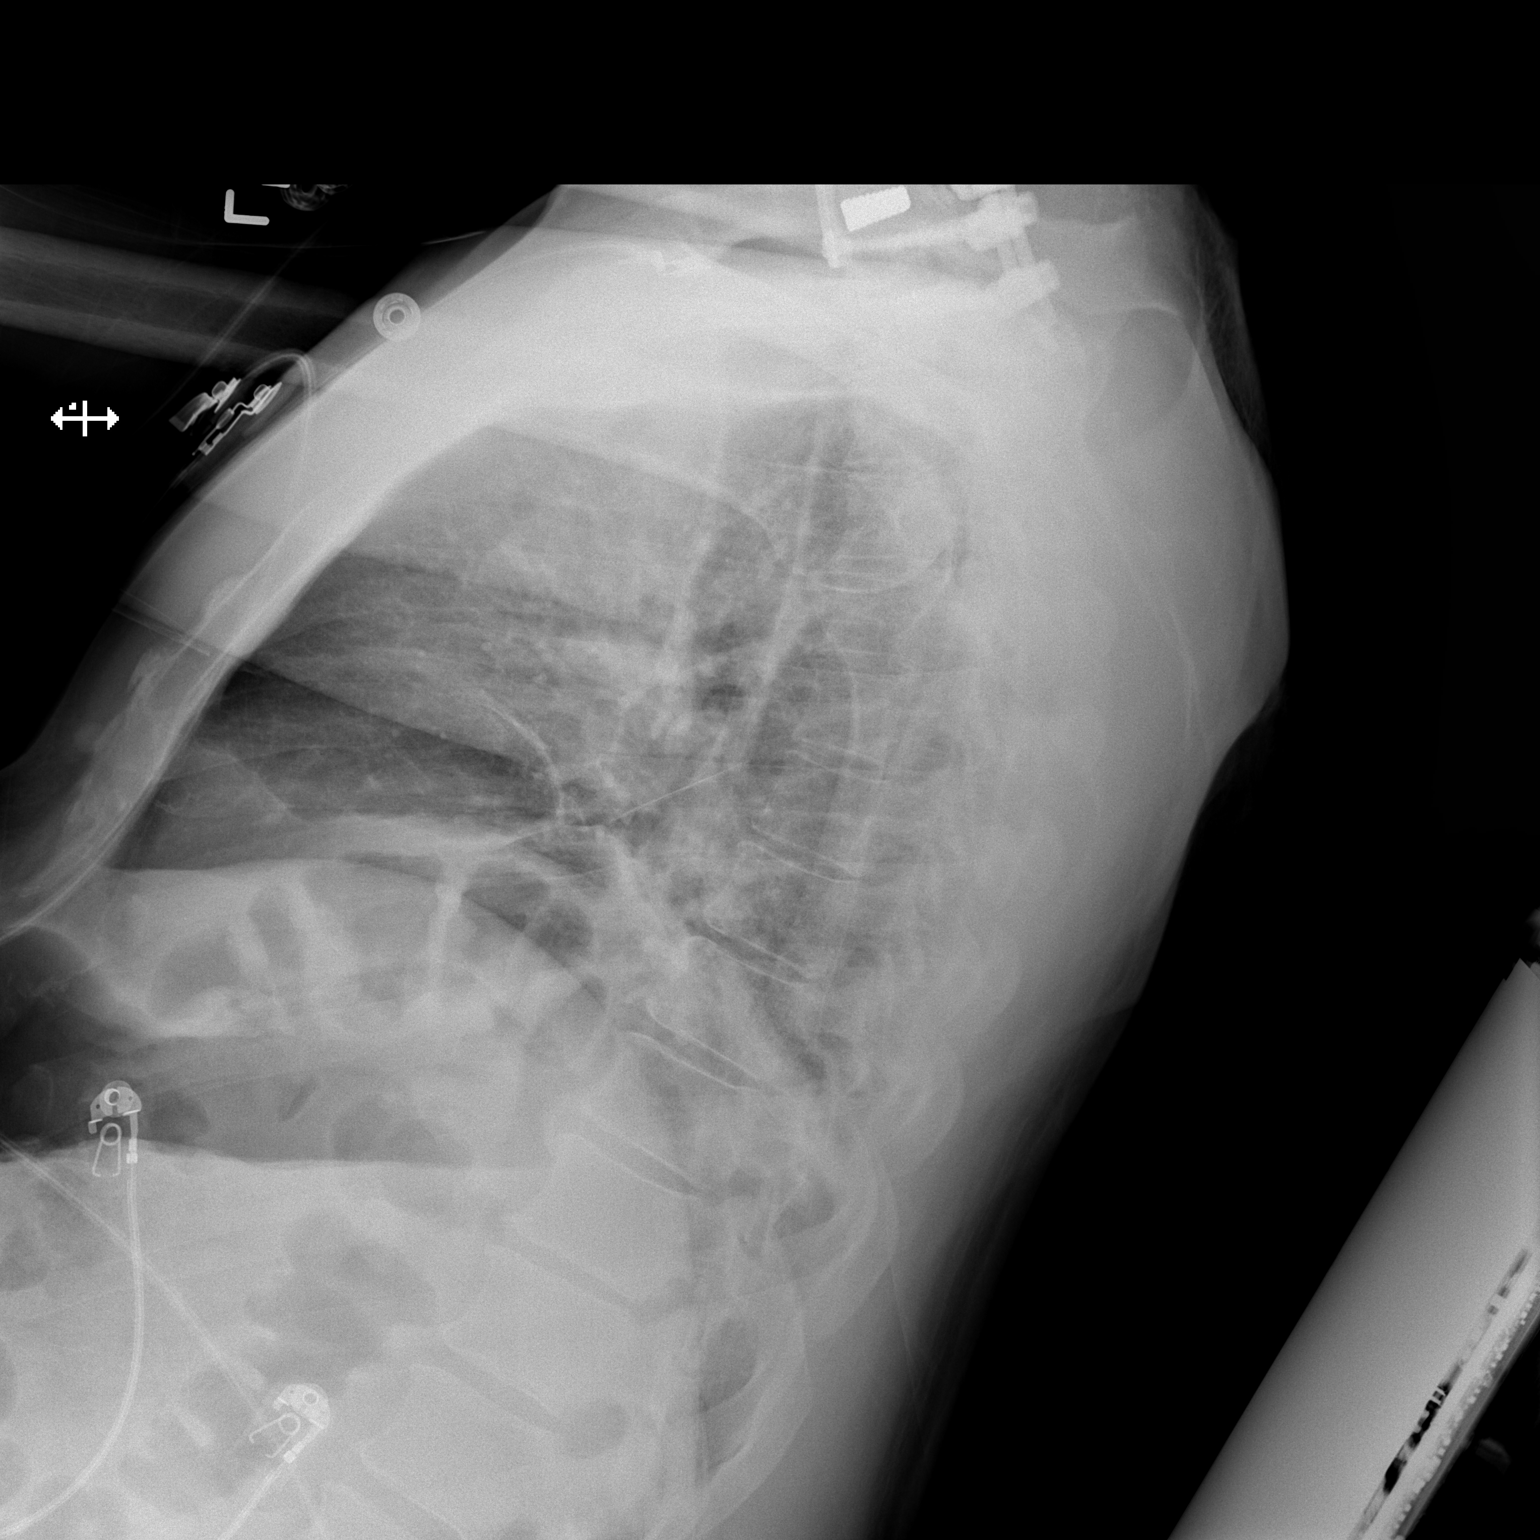

[2 of 2 positions shown; findings below may reference images not displayed]

FINDINGS: Cervical spine fusion . Mediastinum and hilar structures are normal.
Heart size normal . Bibasilar atelectasis. No pleural effusion or
pneumothorax .
IMPRESSION: Low lung volumes with mild bibasilar atelectasis and/or scarring.

## 2018-02-16 IMAGING — MR MR MRA NECK WO/W CM
18 of 29 series · 19 of 48 positions shown · IV contrast (Yes   MULTIHANCE)
Comparison: Comparison made with prior CT from earlier the same day
as well as previous MRI from 01/27/2017.

CLINICAL DATA: Initial evaluation for acute left-sided weakness.

EXAM:
MRI HEAD WITHOUT CONTRAST
MRA HEAD WITHOUT CONTRAST
MRA NECK WITHOUT AND WITH CONTRAST
MRI CERVICAL SPINE WITHOUT AND WITH CONTRAST
TECHNIQUE: Multiplanar, multiecho pulse sequences of the brain and surrounding
structures were obtained without intravenous contrast. Angiographic
images of the Circle of Willis were obtained using MRA technique
without intravenous contrast. Angiographic images of the neck were
obtained using MRA technique without and with intravenous contrast.
Carotid stenosis measurements (when applicable) are obtained
utilizing NASCET criteria, using the distal internal carotid
diameter as the denominator.
CONTRAST:  18mL MULTIHANCE GADOBENATE DIMEGLUMINE 529 MG/ML IV SOLN

[Series 3: DWI · axial · 3.0mm · 1.09mm/px · 1 of 84 slices shown (1 of 4)]
[im 1/84]
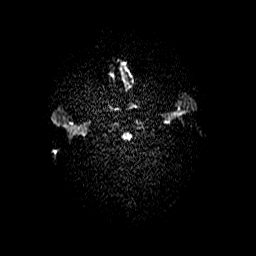

[Series 4: (id) mt fs · axial · 1.4mm · 0.43mm/px · z∈[-10,+83]mm · 2 of 136 slices shown]
[im 1/136]
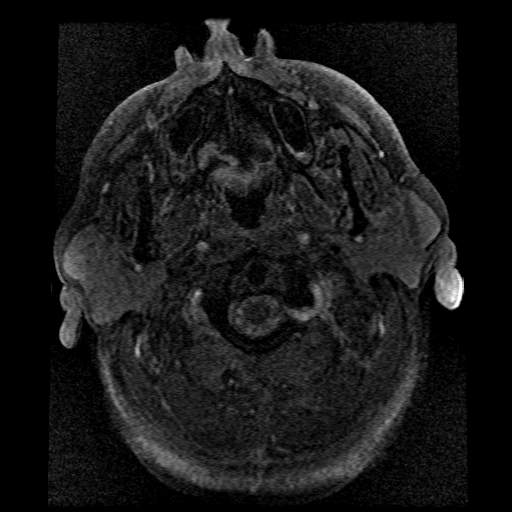
[im 136/136]
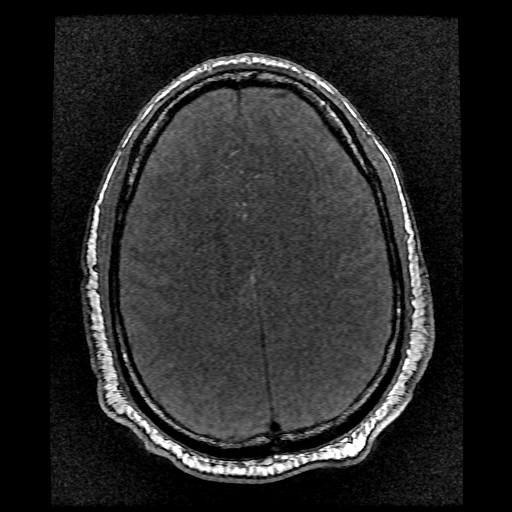

[Series 5: DWI · coronal · 5.0mm · 1.09mm/px · 1 of 66 slices shown (2 of 4)]
[im 1/66]
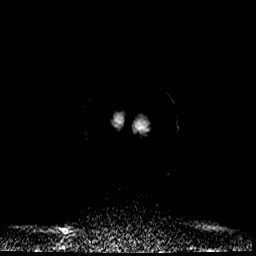

[Series 6: T1 · sagittal · 5.0mm · 0.47mm/px · 1 of 23 slices shown (1 of 3)]
[im 1/23]
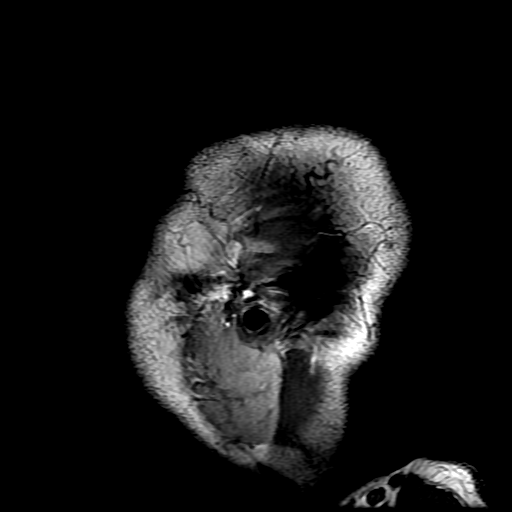

[Series 7: T2 · axial · 5.0mm · 0.43mm/px · 1 of 21 slices shown (1 of 4)]
[im 1/21]
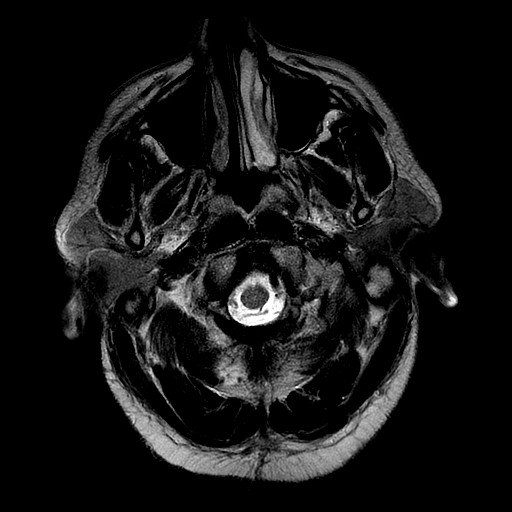

[Series 8: FLAIR · axial · 5.0mm · 0.43mm/px · 1 of 21 slices shown]
[im 1/21]
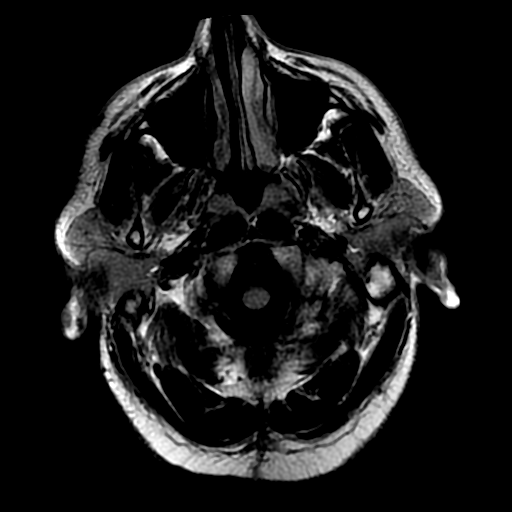

[Series 9: ax mpgr · axial · 5.0mm · 0.43mm/px · 1 of 21 slices shown]
[im 1/21]
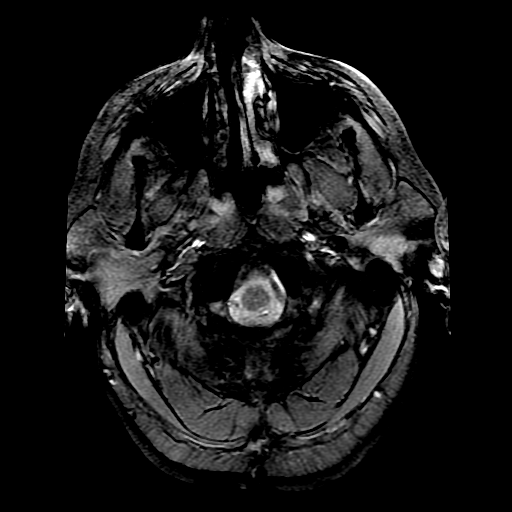

[Series 11: T2 · coronal · 5.0mm · 0.43mm/px · 1 of 28 slices shown (2 of 4)]
[im 1/28]
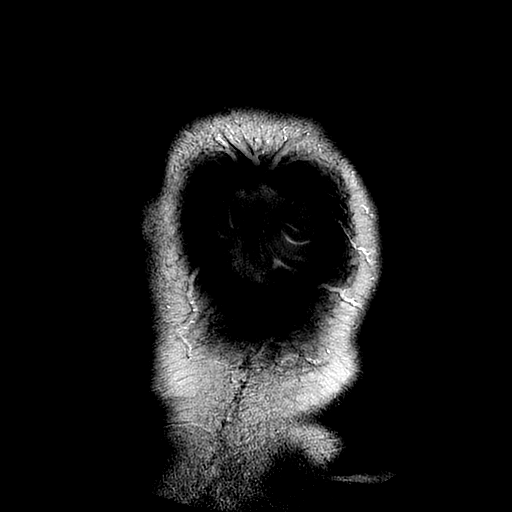

[Series 13: T2 · sagittal · 3.0mm · 0.43mm/px · 1 of 13 slices shown (3 of 4)]
[im 1/13]
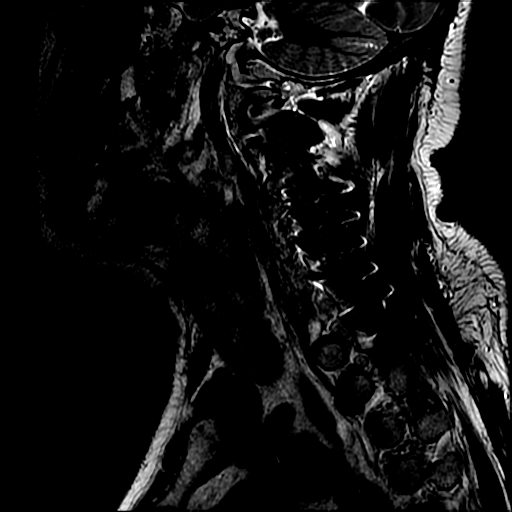

[Series 14: T1 · sagittal · 3.0mm · 0.43mm/px · 1 of 13 slices shown (2 of 3)]
[im 1/13]
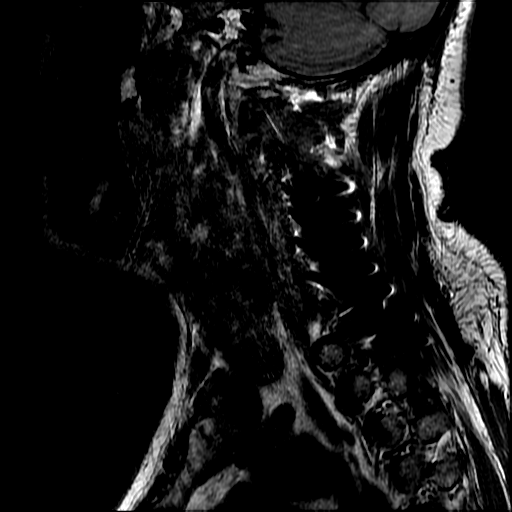

[Series 15: sag ir · sagittal · 3.0mm · 0.43mm/px · 1 of 13 slices shown]
[im 1/13]
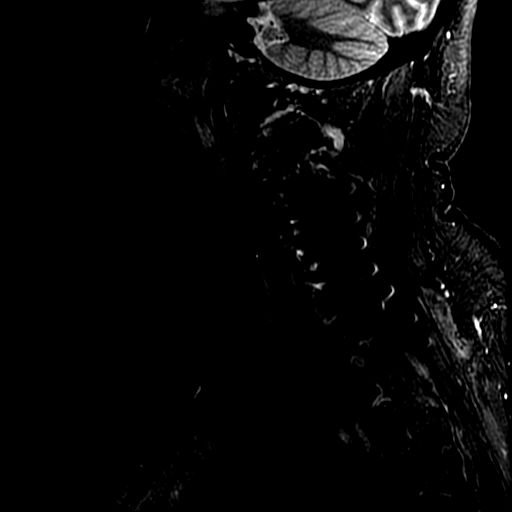

[Series 16: ax 2d merge · axial · 3.0mm · 0.39mm/px · 1 of 34 slices shown]
[im 1/34]
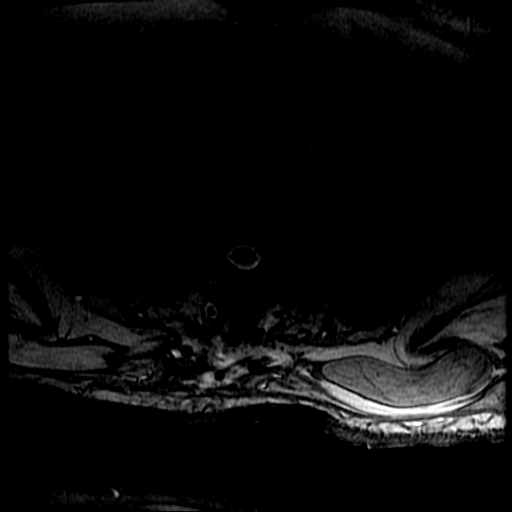

[Series 17: T2 · axial · 3.0mm · 0.39mm/px · 1 of 34 slices shown (4 of 4)]
[im 1/34]
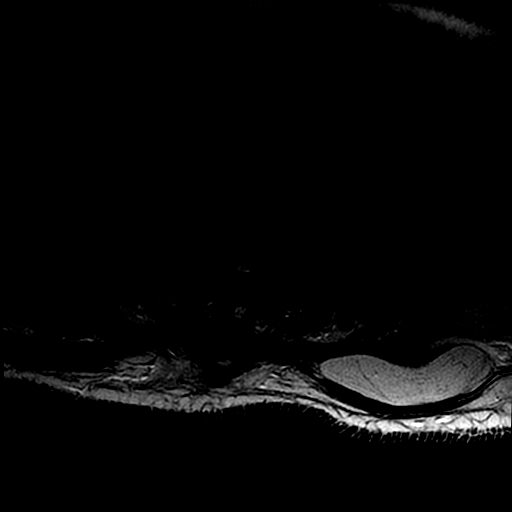

[Series 18: T1 · axial · non-contrast · 3.0mm · 0.78mm/px · 1 of 34 slices shown (3 of 3)]
[im 1/34]
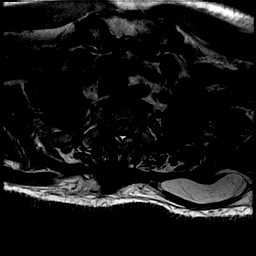

[Series 22: T1 fat-sat post-contrast · sagittal · 3.0mm · 0.43mm/px · 1 of 13 slices shown]
[im 1/13]
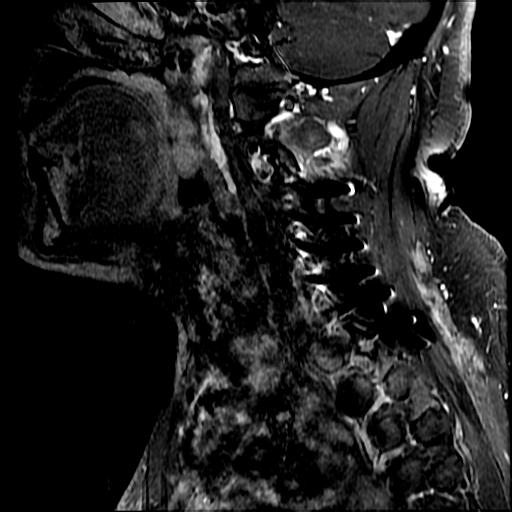

[Series 23: T1 post-contrast · axial · 3.0mm · 0.78mm/px · 1 of 34 slices shown]
[im 1/34]
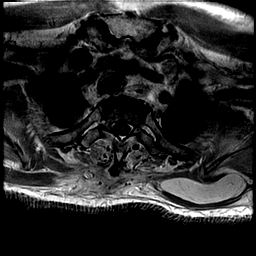

[Series 300: DWI · axial · 3.0mm · 1.09mm/px · 1 of 42 slices shown (3 of 4)]
[im 1/42]
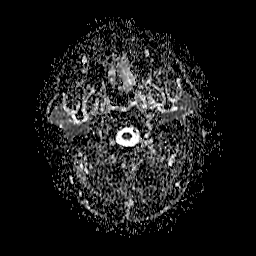

[Series 500: DWI · coronal · 5.0mm · 1.09mm/px · 1 of 33 slices shown (4 of 4)]
[im 1/33]
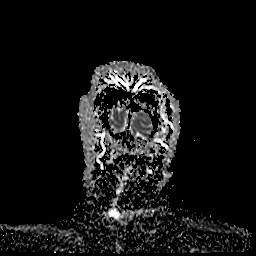

[19 of 48 positions shown; findings below may reference images not displayed]

FINDINGS: MRI HEAD FINDINGS

Cerebral volume normal for age. No significant cerebral white matter
changes for patient age. No abnormal foci of restricted diffusion to
suggest acute or subacute ischemia. Gray-white matter
differentiation well maintained. No encephalomalacia to suggest
chronic infarction. No evidence for acute or chronic intracranial
hemorrhage.

No mass lesion, midline shift or mass effect. No hydrocephalus. No
extra-axial fluid collection. Major dural sinuses are grossly
patent.

Pituitary gland suprasellar region within normal limits. Midline
structures intact and normal.

Major intracranial vascular flow voids are maintained.

Craniocervical junction within normal limits. Postsurgical changes
noted within the visualized upper cervical spine. Bone marrow signal
intensity within normal limits. No scalp soft tissue abnormality.

Globes and orbital soft tissues within normal limits.

Paranasal sinuses are largely clear. No mastoid effusion. Inner ear
structures normal.

MRA HEAD FINDINGS

ANTERIOR CIRCULATION:

Visualized distal cervical segments of the internal carotid artery
is are widely patent with antegrade flow. Petrous, cavernous, and
supraclinoid segments widely patent bilaterally without
flow-limiting stenosis. ICA termini widely patent. A1 segments
widely patent. Anterior communicating artery normal. Anterior
cerebral artery is widely patent to their distal aspects. M1
segments patent without stenosis or occlusion. MCA bifurcations
normal. No proximal M2 occlusion. Distal MCA branches well opacified
and symmetric.

POSTERIOR CIRCULATION:

Left vertebral artery dominant and widely patent to the
vertebrobasilar junction. Right vertebral artery diffusely
hypoplastic and attenuated, and largely terminates in PICA, although
a tiny ascending branch ascends towards the vertebrobasilar
junction. Posterior inferior cerebral arteries patent proximally.
Basilar artery widely patent. Superior cerebral arteries patent
bilaterally. Both of the posterior cerebral arteries primarily
supplied via the basilar and are widely patent to their distal
aspects.

MRA NECK FINDINGS

Source images reviewed.

Partially visualized aortic arch of normal caliber with normal
branch pattern. Common origin of the right brachiocephalic and left
common carotid artery noted. No flow-limiting stenosis about the
origin of the great vessels. Visualized portions of the subclavian
artery is are widely patent bilaterally.

Right common and internal carotid artery's are widely patent without
stenosis or occlusion. No significant atheromatous changes or
stenosis about the right carotid bifurcation.

Left common and internal carotid artery's widely patent without
stenosis or occlusion. No significant atheromatous changes or
stenosis about the left carotid bifurcation.

Both of the vertebral arteries arise from the subclavian arteries,
although the origin of the right vertebral artery is poorly
visualized on this exam. Left vertebral artery dominant. Right
vertebral artery diffusely hypoplastic. Vertebral artery's widely
patent within the neck without stenosis or occlusion.

MRI CERVICAL SPINE FINDINGS

Straightening of the normal cervical lordosis. Trace anterolisthesis
of C3 on C4, stable from previous.

Vertebral body heights are maintained. No evidence for acute or
chronic fracture. Postoperative changes from prior ACDF at C4
through C7, with prior posterior decompression and fusion at C3
through T1 hardware and grossly stable position. Signal intensity
within the vertebral body bone marrow within normal limits, although
evaluation limited about the fixation hardware. No abnormal marrow
edema.

Signal intensity within the cervical spinal cord is normal.

Craniocervical junction within normal limits. Previously seen large
postoperative collection within the posterior neck has largely
resolved. Persistent small collection at the laminectomy site
measures 0.6 x 1.4 x 4.8 cm, consistent with a resolving
postoperative seroma. Normal expected postoperative changes with
enhancement present within the adjacent soft tissues of the
posterior neck. Intramuscular lipoma noted within the left
trapezius. No abnormal prevertebral edema.

C2-C3: Shallow posterior disc osteophyte complex mildly indents the
ventral thecal sac. Mild facet arthrosis. No significant canal or
foraminal stenosis.

C3-C4: Status post posterior decompression. Persistent mild disc
bulge, stable. Relatively mild canal narrowing at this level,
improved relative to previous exam due to resolving postoperative
collection. Mild to moderate residual foraminal narrowing, greater
on the left, stable.

C4-C5: Status post ACDF and posterior decompression. No residual
canal stenosis. No significant foraminal narrowing.

C5-C6: Status post ACDF. No residual canal stenosis. Mild
uncovertebral hypertrophy. Mild to moderate residual bilateral C6
foraminal stenosis, slightly worse on the left, stable.

C6-C7: Status post ACDF and posterior decompression. No canal
stenosis. Mild bilateral C6 foraminal stenosis, stable.

C7-T1: Status post posterior decompression with fusion. No canal
stenosis. Stable mild disc bulge. No appreciable foraminal
narrowing.

Visualized upper thoracic spine within normal limits.
IMPRESSION: MRI HEAD IMPRESSION:

Normal brain MRI for patient age. No acute intracranial process
identified.

MRA HEAD IMPRESSION:

Normal intracranial MRA.

MRA NECK IMPRESSION:

Normal MRA of the neck.

MRI CERVICAL SPINE IMPRESSION:

1. No acute abnormality within the cervical spine.
2. Postoperative changes from prior ACDF at C4 through C7 with
posterior decompression and fusion at C3 through T1. No residual
canal stenosis.
3. Previously seen large postoperative collection has largely
resolved, with a small residual collection at the laminectomy site
as above.
4. Stable mild disc bulge at C3-4 with resultant mild spinal
stenosis. Mild to moderate multilevel foraminal narrowing as above,
stable.

## 2018-02-16 IMAGING — CT CT T SPINE W/O CM
3 of 4 series · 13 of 33 positions shown, 15 images · non-contrast
Comparison: Chest CTA 02/14/2017

CLINICAL DATA: Weakness and numbness.

EXAM:
CT THORACIC SPINE WITHOUT CONTRAST
TECHNIQUE: Multidetector CT images of the thoracic were obtained using the
standard protocol without intravenous contrast.

[Series 16: t-spine 2.0 st · axial · 0.36mm/px · z∈[-498,-262]mm · 5 of 178 slices shown, 7 images]
[im 30/178  soft-tissue]
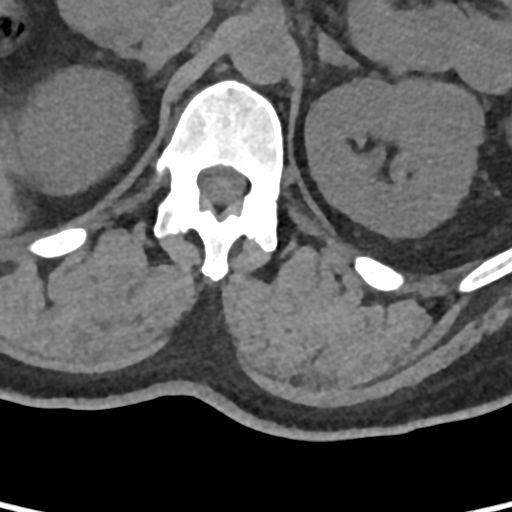
[im 30/178  bone]
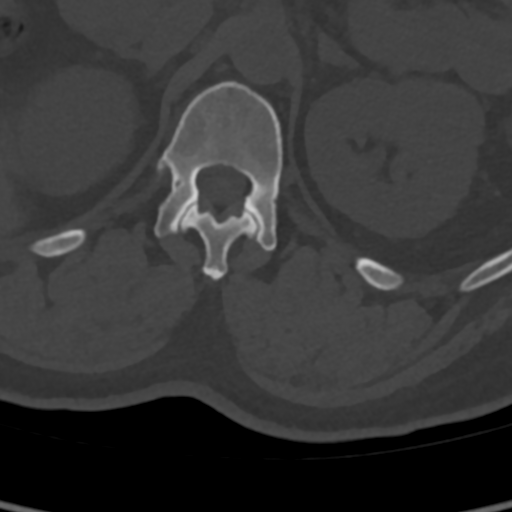
[im 60/178  bone]
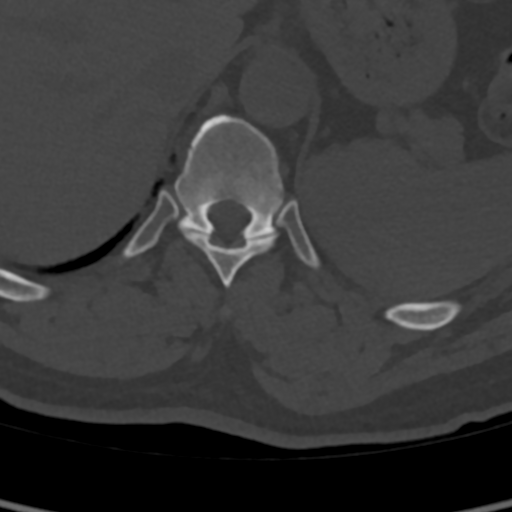
[im 89/178  bone]
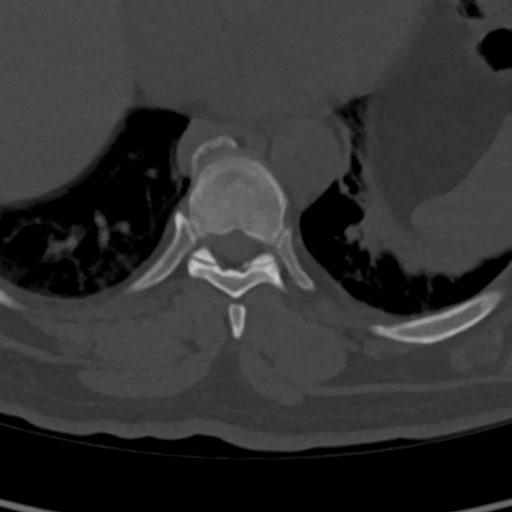
[im 119/178  bone]
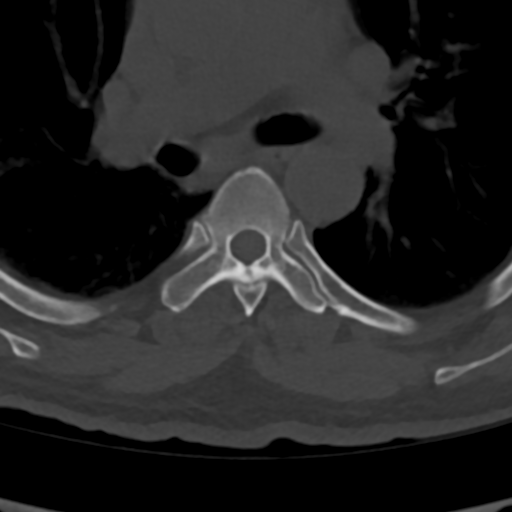
[im 148/178  soft-tissue]
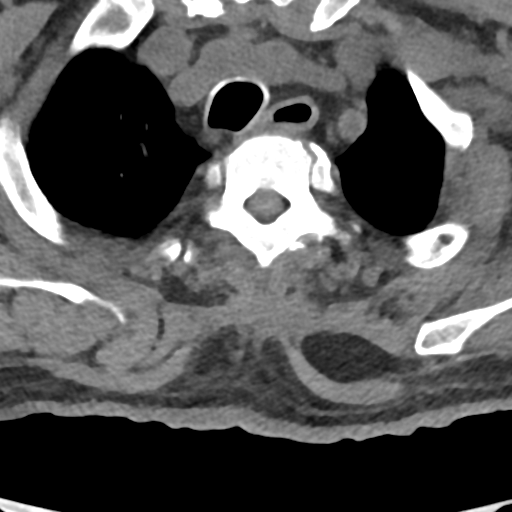
[im 148/178  bone]
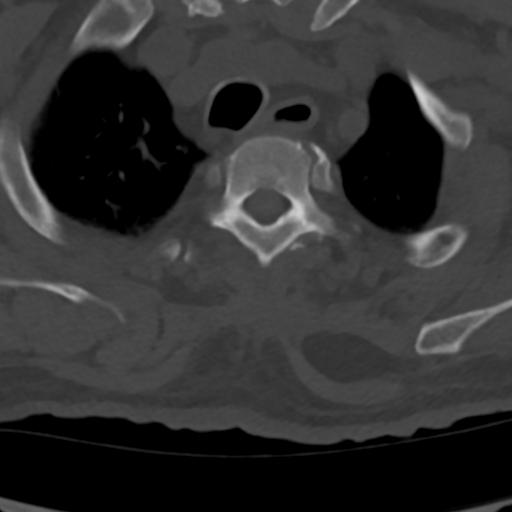

[Series 18: t-spine 2.0 cor bone · coronal · 0.28mm/px · 3 of 76 slices shown]
[im 16/76  bone]
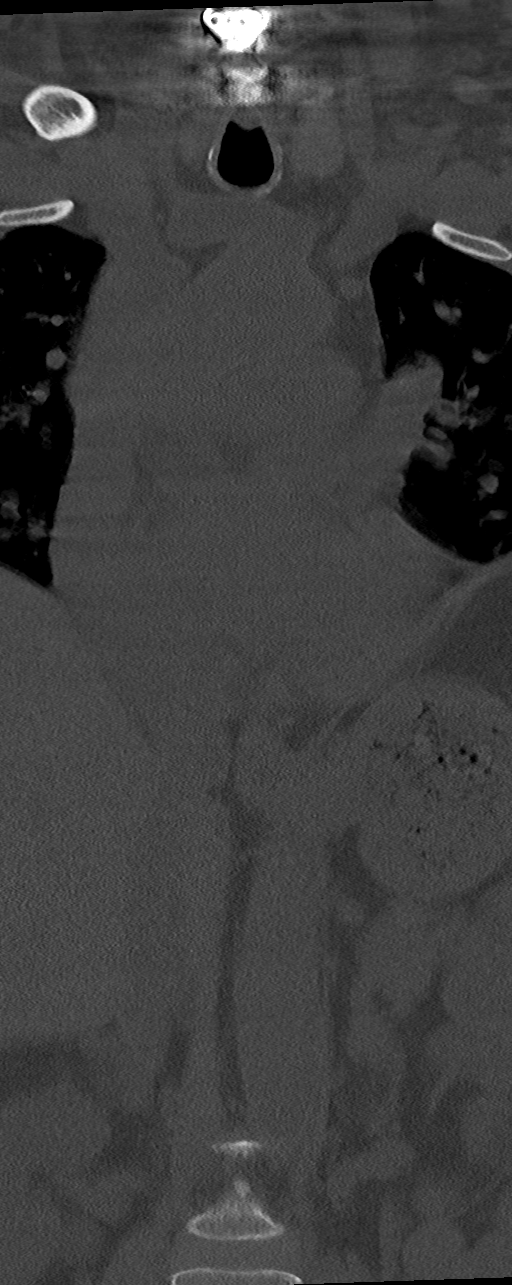
[im 31/76  bone]
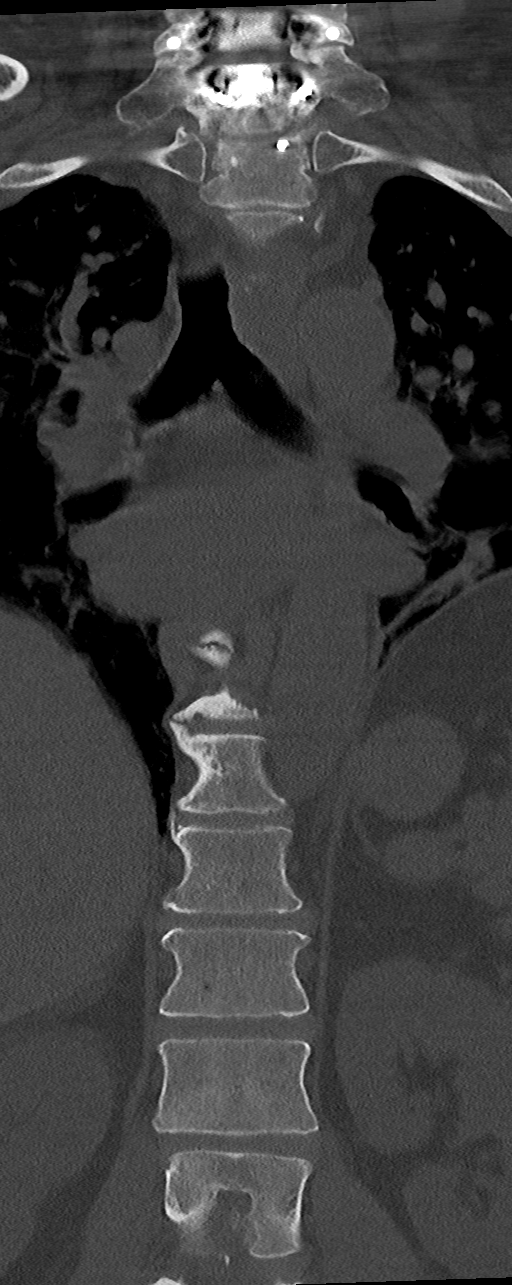
[im 46/76  bone]
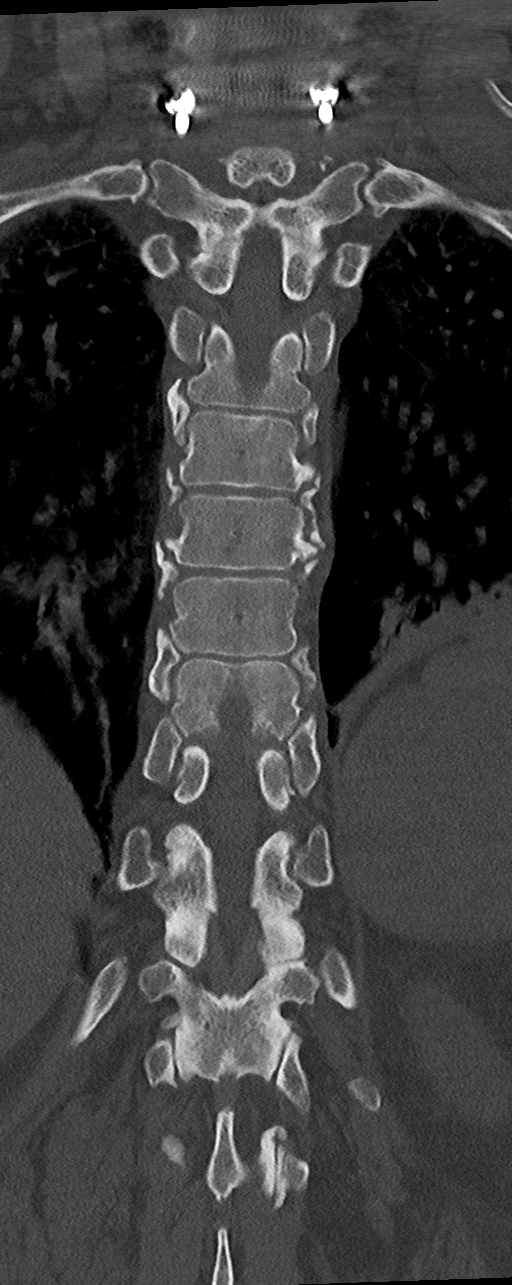

[Series 21: t-spine 2.0 orthogonal · axial · 0.21mm/px · z∈[-495,-273]mm · 5 of 184 slices shown]
[im 31/184  bone]
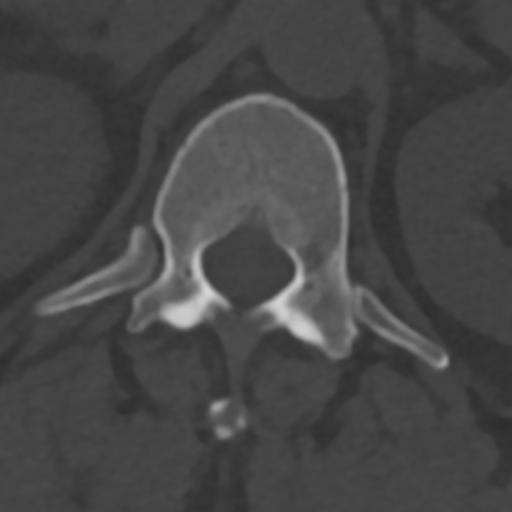
[im 62/184  bone]
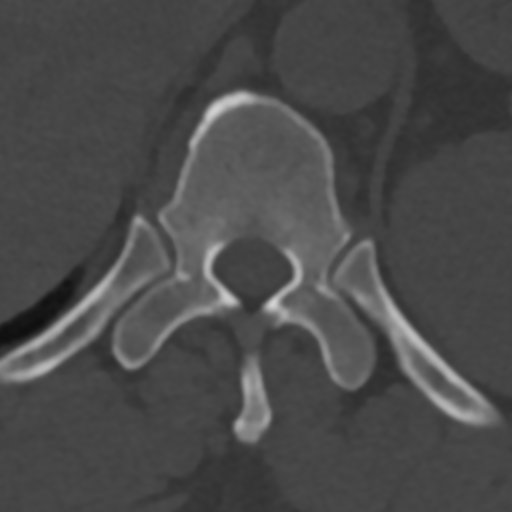
[im 92/184  bone]
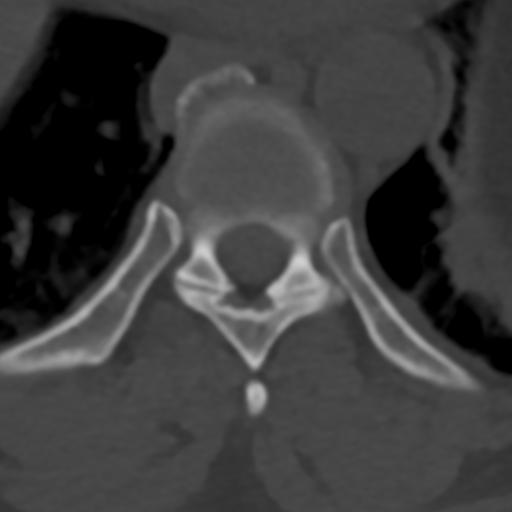
[im 123/184  bone]
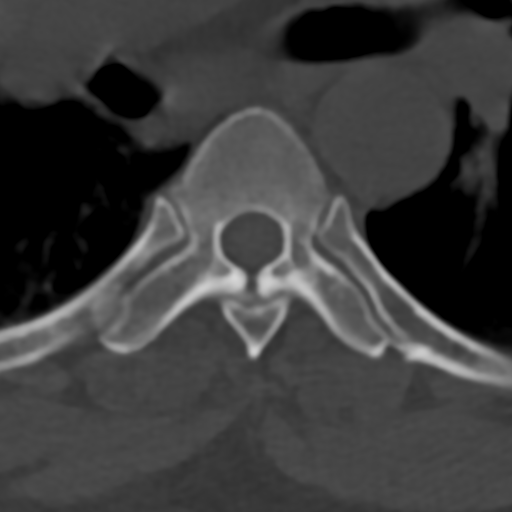
[im 153/184  bone]
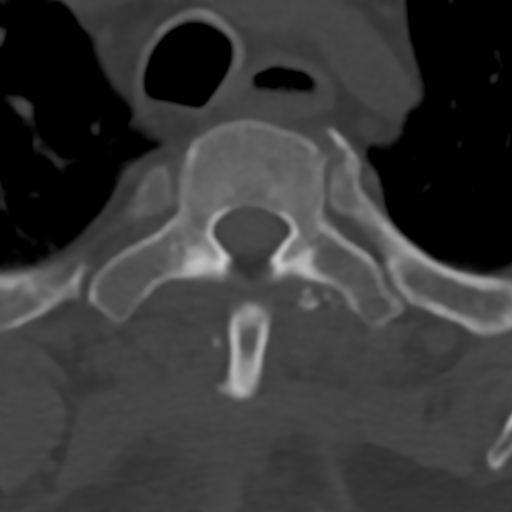

[13 of 33 positions shown; findings below may reference images not displayed]

FINDINGS: Alignment: Normal.

Vertebrae: No evidence of acute fracture or destructive osseous
process. Anterior cervical spine fusion changes extending to C7 and
posterior fusion changes extending to T1, more fully evaluated on
separate cervical spine CT.

Paraspinal and other soft tissues: Left lung base atelectasis.
Punctate nonobstructing calculus in the upper pole of the left
kidney. Coronary artery atherosclerosis.

Disc levels: Mild thoracic spondylosis. Detailed assessment of
degenerative changes is limited on this non-myelographic
examination. Mild disc bulging is evident at T6-7, T8-9, and T9-10
without gross spinal stenosis. There is more extensive
circumferential disc bulging at T10-11 resulting in mild spinal
stenosis. Mild facet arthrosis and foraminal endplate spurring at
T10-11 contribute to moderate to severe biforaminal stenosis.
Spurring at T1-2 results in mild-to-moderate right neural foraminal
stenosis. There is mild disc bulging and mild-to-moderate facet
hypertrophy at T12-L1 without gross stenosis.
IMPRESSION: 1. No evidence of acute osseous abnormality.
2. Thoracic spondylosis, most notable at T10-11 where there is mild
spinal stenosis and moderate to severe biforaminal stenosis.

## 2018-02-17 ENCOUNTER — Encounter: Payer: Self-pay | Admitting: Physical Therapy

## 2018-02-17 ENCOUNTER — Other Ambulatory Visit: Payer: Self-pay | Admitting: Internal Medicine

## 2018-02-17 MED FILL — METOCLOPRAMIDE 10 MG TABLET: 10 | 2 days supply | Qty: 10 | Fill #0

## 2018-02-17 MED FILL — OMEPRAZOLE DR 20 MG CAPSULE: 20 | 30 days supply | Qty: 30 | Fill #0

## 2018-02-18 ENCOUNTER — Encounter: Payer: Self-pay | Admitting: Pharmacist

## 2018-02-19 ENCOUNTER — Encounter: Payer: Self-pay | Admitting: Physical Therapy

## 2018-02-24 ENCOUNTER — Encounter: Payer: Self-pay | Admitting: Physical Therapy

## 2018-02-24 ENCOUNTER — Ambulatory Visit: Payer: Medicaid Other | Attending: Neurosurgery | Admitting: Physical Therapy

## 2018-02-24 DIAGNOSIS — M6281 Muscle weakness (generalized): Secondary | ICD-10-CM | POA: Insufficient documentation

## 2018-02-24 DIAGNOSIS — G540 Brachial plexus disorders: Secondary | ICD-10-CM

## 2018-02-24 DIAGNOSIS — R278 Other lack of coordination: Secondary | ICD-10-CM | POA: Diagnosis present

## 2018-02-24 DIAGNOSIS — M79602 Pain in left arm: Secondary | ICD-10-CM | POA: Diagnosis present

## 2018-02-24 NOTE — Therapy (Signed)
[facility] Outpatient Rehabilitation [facility] [address] [place], [place], [address] Phone: [phone]   Fax:  [phone]  Physical Therapy Treatment  Patient Details  Name: Russell Carter MRN: [id number] Date of Birth: May 14, 1956 Referring Provider: Maeola Harman, MD   Encounter Date: 02/24/2018  PT End of Session - 02/24/18 1540    Visit Number  5    Number of Visits  20    Date for PT Re-Evaluation  04/11/18    Authorization Type  MCD re Berkley Harvey submitted 4/15    Authorization - Visit Number  1    Authorization - Number of Visits  12    PT Start Time  1502    PT Stop Time  1540    PT Time Calculation (min)  38 min    Activity Tolerance  Patient tolerated treatment well    Behavior During Therapy  [facility] for tasks assessed/performed       Past Medical History:  Diagnosis Date  . Anginal pain (HCC)   . Arthritis   . Blood in stool   . CAD (coronary artery disease)    a. 2011: cath showing mod non-obstructive disease b. 02/2016: cath with 95% stenosis in the Mid RCA. DES placed.  . Cervical stenosis of spine   . Cocaine abuse (HCC)    history  . Depression   . Diabetes mellitus without complication (HCC)   . Gout, unspecified   . Headache   . HTN (hypertension)   . Hypercholesterolemia   . Hyperglycemia   . Migraines   . Myocardial infarction [facility]) 2008   drug cocaine and marijuana at time of mi none  since    . Pneumonia   . Renal insufficiency   . Stroke [facility])     Past Surgical History:  Procedure Laterality Date  . ANTERIOR CERVICAL DECOMP/DISCECTOMY FUSION N/A 01/24/2017   Procedure: Cervical Four-Five,Cervical Five Six,Cervical Six-Seven Anterior cervical discectomy with fusion and plate with Cervical Three-Thoracic- One Laminectomy/Foraminotomy with Cervical Three-Thoracic- One Fixation and fusion;  Surgeon: Loura Halt Ditty, MD;  Location: [facility] OR;  Service: Neurosurgery;  Laterality: N/A;  Part-1 anterior approach  . CARDIAC  CATHETERIZATION  2008; ~ 2011   [name]-40%lad  . CARDIAC CATHETERIZATION N/A 03/16/2016   Procedure: Left Heart Cath and Coronary Angiography;  Surgeon: Marykay Lex, MD;  Location: [facility] INVASIVE CV LAB;  Service: Cardiovascular;  Laterality: N/A;  . CORONARY ANGIOPLASTY     STENTS  . EYE SURGERY Bilateral    "for double vision"  . INGUINAL HERNIA REPAIR  05/12/2012   Procedure: HERNIA REPAIR INGUINAL ADULT;  Surgeon: Liz Malady, MD;  Location: [facility];  Service: General;  Laterality: Right;  . INGUINAL HERNIA REPAIR Left 02/02/2003   Dr. Violeta Gelinas. repair with mesh. Same procedure done again in  05/22/2004   . INGUINAL HERNIA REPAIR Left ?date 2nd OR  . POSTERIOR CERVICAL FUSION/FORAMINOTOMY N/A 01/24/2017   Procedure: Cervical Three-Thoracic One-Laminectomy/Foraminotomy with Cervical Three-Thoracic One Fixation and fusion;  Surgeon: Loura Halt Ditty, MD;  Location: [facility] OR;  Service: Neurosurgery;  Laterality: N/A;  Part-2 Posterior approach  . RECONSTRUCT / STABILIZE DISTAL ULNA    . SHOULDER OPEN ROTATOR CUFF REPAIR Left 2010  . SHOULDER SURGERY Left    "injured on the job; had to cut small bone out"  . THUMB FUSION Right     There were no vitals filed for this visit.  Subjective Assessment - 02/24/18 1506    Subjective  My  neck and back are still bothering me badly, PT is helping but it still hurts quite a bit especially when I'm trying to do things around the house. I still have trouble raising that arm up overhead.     Patient Stated Goals  decrease pain, play basketball/tennis, lift weights, armed securty, karate, dress/cook independently    Currently in Pain?  Yes    Pain Score  6     Pain Location  Other (Comment) L shoulder and neck     Pain Orientation  Left    Pain Descriptors / Indicators  Sharp;Stabbing    Pain Type  Chronic pain    Pain Radiating Towards  into shoulder blade     Pain Onset  More than a month ago    Pain Frequency  Constant     Aggravating Factors   trying to use arm, lifting arm     Pain Relieving Factors  massage, STM, exercises, using arm sometimes     Effect of Pain on Daily Activities  severe                       [facility] Adult PT Treatment/Exercise - 02/24/18 0001      Shoulder Exercises: Supine   External Rotation  PROM;AAROM;10 reps;15 reps    Flexion  PROM;AAROM;10 reps;15 reps    ABduction  PROM;AAROM;10 reps;15 reps      Manual Therapy   Manual Therapy  Soft tissue mobilization    Manual therapy comments  separate from all other skilled services     Soft tissue mobilization  IASTM & STM Lt upper trap, supra & infraspinatus, rhomboids, scalenes, levator             PT Education - 02/24/18 1539    Education provided  Yes    Education Details  progess with PT, POC moving forward, progressions moving forward     Person(s) Educated  Patient    Methods  Explanation    Comprehension  Verbalized understanding       PT Short Term Goals - 02/03/18 1502      PT SHORT TERM GOAL #1   Title  Pt will be able to use scapular retraction through the day to provide support to postural alignment     Baseline  I have learned to sit up and relax which has been comfortable and decreases pain    Status  Achieved      PT SHORT TERM GOAL #2   Title  Pt will demo proper form with HEP for necessary challenge to joint ROM and muscular activation    Baseline  able to demo    Status  Achieved        PT Long Term Goals - 02/03/18 1504      PT LONG TERM GOAL #1   Title  Pt will be able to dress independently with minimal difficulty    Baseline  it is getting a little better but I still need some help- overhead dressing; now able to don own pants and belt    Time  14    Period  Weeks    Status  On-going    Target Date  04/11/18 to accomodate for MCD authorization periods      PT LONG TERM GOAL #2   Title  Average pain <=5/10 to decrease pain limitations on daily activities    Baseline   average 7-8/10    Time  14    Period  Weeks    Status  On-going    Target Date  04/11/18      PT LONG TERM GOAL #3   Title  Pt will be able to use Left arm to cook for himself for return to independence    Baseline  lacking strength to lift pots, pans, etc    Time  14    Period  Weeks    Status  New    Target Date  04/11/18      PT LONG TERM GOAL #4   Title  Pt will be able to return to light sporting activities such as beginning karate and basketball to begin return to PLOF    Baseline  unable due to lack of strength and ROM    Time  14    Period  Weeks    Status  On-going    Target Date  04/11/18      PT LONG TERM GOAL #5   Title  Pt will be able to shave independently    Baseline  requires assistance    Time  14    Period  Weeks    Status  On-going    Target Date  04/11/18            Plan - 02/24/18 1540    Clinical Impression Statement  Continued focus on L shoulder ROM today, beginning with PROM for functional motion and stretching of shoulder complex, and progressing with AAROM and STM per patient request today. AROM activities performed as tolerated and as time allowed this session. He will continue to benefit from skilled PT services to address shoulder ROM and ensure correct shoulder mechanics moving forward.     Rehab Potential  Fair    PT Frequency  2x / week    PT Duration  8 weeks    PT Treatment/Interventions  ADLs/Self Care Home Management;Cryotherapy;Electrical Stimulation;Ultrasound;Moist Heat;Iontophoresis 4mg /ml Dexamethasone;Therapeutic activities;Therapeutic exercise;Patient/family education;Manual techniques;Scar mobilization;Passive range of motion;Taping;Dry needling    PT Next Visit Plan  continue AAROM, introduce supine AROM; door stretch     PT Home Exercise Plan  PROM elbow & GHJ flexion, scap retraction, cervical rotation+scap retraction, supine iso flx hold at 90, seated ER yellow tband with pillow; sidelying ER, supine biceps curl, table  towel slide    Consulted and Agree with Plan of Care  Patient       Patient will benefit from skilled therapeutic intervention in order to improve the following deficits and impairments:  Pain, Improper body mechanics, Postural dysfunction, Increased muscle spasms, Decreased activity tolerance, Decreased range of motion, Decreased strength, Impaired UE functional use  Visit Diagnosis: 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 Left knee pain 01/10/2018  . Dental caries 09/18/2017  . Syncope and collapse 09/04/2017  . Left arm weakness 08/07/2017  . Numbness and tingling in both hands 07/31/2017  . Gastroesophageal reflux disease without esophagitis 07/31/2017  . Moderate episode of recurrent major depressive disorder (HCC) 07/31/2017  . 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 Chronic pain disorder 06/05/2017  . Supratherapeutic INR 04/27/2017  . Gastrointestinal hemorrhage with melena   . Blood loss anemia 04/25/2017  . Pulmonary embolism (HCC) 02/14/2017  . Fatigue associated with anemia   . Post-operative pain   . Radicular pain   . Diabetes mellitus type  2 in nonobese (HCC)   . Surgery, elective   . Postoperative pain   . Leukocytosis   . Thrombocytopenia (HCC)   . History of myocardial infarction   . Cocaine abuse (HCC)   . Depression   . Type 2 diabetes mellitus with hyperglycemia, without long-term current use of insulin (HCC)   . Stage 3 chronic kidney disease (HCC)   . Benign essential HTN   . History of CVA (cerebrovascular accident)   . Prediabetes   . Cervical spondylosis with radiculopathy 01/24/2017  . Dyslipidemia 03/15/2016  . Unstable angina (HCC) - chest pain with complete heart block & chest pain 03/15/2016  . Complete heart block (HCC) 03/14/2016  . Cervical  spinal stenosis 03/14/2016  . Chest pain with moderate risk of acute coronary syndrome 03/13/2016  . CAD-50% LAD 2011 12/14/2014  . Gout 11/16/2014  . Acute intractable headache 07/15/2013  . Polysubstance abuse (HCC) 01/15/2013  . DIZZINESS 06/05/2010  . Essential hypertension 05/18/2009  . Acute myocardial infarction (HCC) 05/18/2009  . MUSCLE SPASM 05/18/2009    Nedra Hai PT, DPT, CBIS  Supplemental Physical Therapist [facility]   Pager [phone]   [facility] Outpatient Rehabilitation [facility] [address] [place], [place], [address] Phone: [phone]   Fax:  [phone]  Name: Jaun Galluzzo MRN: [id number] Date of Birth: 1955/12/02

## 2018-02-26 ENCOUNTER — Encounter: Payer: Self-pay | Admitting: Physical Therapy

## 2018-02-26 ENCOUNTER — Ambulatory Visit: Payer: Medicaid Other | Admitting: Physical Therapy

## 2018-02-26 DIAGNOSIS — G540 Brachial plexus disorders: Secondary | ICD-10-CM | POA: Diagnosis not present

## 2018-02-26 DIAGNOSIS — M6281 Muscle weakness (generalized): Secondary | ICD-10-CM

## 2018-02-26 DIAGNOSIS — M79602 Pain in left arm: Secondary | ICD-10-CM

## 2018-02-26 DIAGNOSIS — R278 Other lack of coordination: Secondary | ICD-10-CM

## 2018-02-26 NOTE — Therapy (Signed)
[facility] Outpatient Rehabilitation [facility] [address] [place], [place], [address] Phone: [phone]   Fax:  [phone]  Physical Therapy Treatment  Patient Details  Name: Russell Carter MRN: [id number] Date of Birth: 1956/06/16 Referring Provider: Maeola Harman, MD   Encounter Date: 02/26/2018  PT End of Session - 02/26/18 1541    Visit Number  6    Number of Visits  20    Date for PT Re-Evaluation  04/11/18    Authorization Type  MCD re Berkley Harvey submitted 4/15    Authorization - Visit Number  2    Authorization - Number of Visits  12    PT Start Time  1502    PT Stop Time  1541    PT Time Calculation (min)  39 min    Activity Tolerance  Patient tolerated treatment well    Behavior During Therapy  [facility] for tasks assessed/performed       Past Medical History:  Diagnosis Date  . Anginal pain (HCC)   . Arthritis   . Blood in stool   . CAD (coronary artery disease)    a. 2011: cath showing mod non-obstructive disease b. 02/2016: cath with 95% stenosis in the Mid RCA. DES placed.  . Cervical stenosis of spine   . Cocaine abuse (HCC)    history  . Depression   . Diabetes mellitus without complication (HCC)   . Gout, unspecified   . Headache   . HTN (hypertension)   . Hypercholesterolemia   . Hyperglycemia   . Migraines   . Myocardial infarction [facility]) 2008   drug cocaine and marijuana at time of mi none  since    . Pneumonia   . Renal insufficiency   . Stroke [facility])     Past Surgical History:  Procedure Laterality Date  . ANTERIOR CERVICAL DECOMP/DISCECTOMY FUSION N/A 01/24/2017   Procedure: Cervical Four-Five,Cervical Five Six,Cervical Six-Seven Anterior cervical discectomy with fusion and plate with Cervical Three-Thoracic- One Laminectomy/Foraminotomy with Cervical Three-Thoracic- One Fixation and fusion;  Surgeon: Loura Halt Ditty, MD;  Location: [facility] OR;  Service: Neurosurgery;  Laterality: N/A;  Part-1 anterior approach  . CARDIAC  CATHETERIZATION  2008; ~ 2011   [place]-40%lad  . CARDIAC CATHETERIZATION N/A 03/16/2016   Procedure: Left Heart Cath and Coronary Angiography;  Surgeon: Marykay Lex, MD;  Location: [facility] INVASIVE CV LAB;  Service: Cardiovascular;  Laterality: N/A;  . CORONARY ANGIOPLASTY     STENTS  . EYE SURGERY Bilateral    "for double vision"  . INGUINAL HERNIA REPAIR  05/12/2012   Procedure: HERNIA REPAIR INGUINAL ADULT;  Surgeon: Liz Malady, MD;  Location: [facility];  Service: General;  Laterality: Right;  . INGUINAL HERNIA REPAIR Left 02/02/2003   Dr. Violeta Gelinas. repair with mesh. Same procedure done again in  05/22/2004   . INGUINAL HERNIA REPAIR Left ?date 2nd OR  . POSTERIOR CERVICAL FUSION/FORAMINOTOMY N/A 01/24/2017   Procedure: Cervical Three-Thoracic One-Laminectomy/Foraminotomy with Cervical Three-Thoracic One Fixation and fusion;  Surgeon: Loura Halt Ditty, MD;  Location: [facility] OR;  Service: Neurosurgery;  Laterality: N/A;  Part-2 Posterior approach  . RECONSTRUCT / STABILIZE DISTAL ULNA    . SHOULDER OPEN ROTATOR CUFF REPAIR Left 2010  . SHOULDER SURGERY Left    "injured on the job; had to cut small bone out"  . THUMB FUSION Right     There were no vitals filed for this visit.  Subjective Assessment - 02/26/18 1503    Subjective  I  was busy this morning, I am tired and sleepy today. My arm felt looser after last session.     Patient Stated Goals  decrease pain, play basketball/tennis, lift weights, armed securty, karate, dress/cook independently    Currently in Pain?  Yes    Pain Score  5     Pain Location  Other (Comment) L shoulder, back, neck     Pain Orientation  Left    Pain Descriptors / Indicators  Stabbing;[facility] Adult PT Treatment/Exercise - 02/26/18 0001      Shoulder Exercises: Supine   External Rotation  PROM;AROM;15 reps;Left    Flexion  AROM;15 reps    ABduction  AROM;Left;15 reps    Other Supine  Exercises  flexion, ER,  and abduction stretching/PROM x10 each     Other Supine Exercises  serratus punches 1x15 B      Shoulder Exercises: Seated   Retraction  15 reps;Other (comment) 3 second holds     Other Seated Exercises  shoulder rolls- up, back, down 1x15       Manual Therapy   Manual Therapy  Soft tissue mobilization    Manual therapy comments  separate from all other skilled services     Soft tissue mobilization  IASTM & STM Lt upper trap, supra & infraspinatus, rhomboids, scalenes, levator             PT Education - 02/26/18 1540    Education provided  Yes    Education Details  general progression with PT mving forward     Person(s) Educated  Patient    Methods  Explanation    Comprehension  Verbalized understanding       PT Short Term Goals - 02/03/18 1502      PT SHORT TERM GOAL #1   Title  Pt will be able to use scapular retraction through the day to provide support to postural alignment     Baseline  I have learned to sit up and relax which has been comfortable and decreases pain    Status  Achieved      PT SHORT TERM GOAL #2   Title  Pt will demo proper form with HEP for necessary challenge to joint ROM and muscular activation    Baseline  able to demo    Status  Achieved        PT Long Term Goals - 02/03/18 1504      PT LONG TERM GOAL #1   Title  Pt will be able to dress independently with minimal difficulty    Baseline  it is getting a little better but I still need some help- overhead dressing; now able to don own pants and belt    Time  14    Period  Weeks    Status  On-going    Target Date  04/11/18 to accomodate for MCD authorization periods      PT LONG TERM GOAL #2   Title  Average pain <=5/10 to decrease pain limitations on daily activities    Baseline  average 7-8/10    Time  14    Period  Weeks    Status  On-going    Target Date  04/11/18      PT LONG TERM GOAL #3   Title  Pt will be able to use Left arm to cook for himself  for  return to independence    Baseline  lacking strength to lift pots, pans, etc    Time  14    Period  Weeks    Status  New    Target Date  04/11/18      PT LONG TERM GOAL #4   Title  Pt will be able to return to light sporting activities such as beginning karate and basketball to begin return to PLOF    Baseline  unable due to lack of strength and ROM    Time  14    Period  Weeks    Status  On-going    Target Date  04/11/18      PT LONG TERM GOAL #5   Title  Pt will be able to shave independently    Baseline  requires assistance    Time  14    Period  Weeks    Status  On-going    Target Date  04/11/18            Plan - 02/26/18 1541    Clinical Impression Statement  Patient arrives today reporting that his fianc has been helping him to stretch his shoulder out, PROM and general flexibility of shoulder are much improved today, as such progressed to AROM in gravity eliminated positions this session, also progressed functional exercises for scapular musculature as tolerated. Also continued with STM and manual interventions to musculature of L neck and shoulder due to severe and ongoing muscle spasm in this region.     Rehab Potential  Fair    PT Frequency  2x / week    PT Duration  8 weeks    PT Treatment/Interventions  ADLs/Self Care Home Management;Cryotherapy;Electrical Stimulation;Ultrasound;Moist Heat;Iontophoresis 4mg /ml Dexamethasone;Therapeutic activities;Therapeutic exercise;Patient/family education;Manual techniques;Scar mobilization;Passive range of motion;Taping;Dry needling    PT Next Visit Plan  continue shoulder stretching and AROM, scapular challenges, door stretch     PT Home Exercise Plan  PROM elbow & GHJ flexion, scap retraction, cervical rotation+scap retraction, supine iso flx hold at 90, seated ER yellow tband with pillow; sidelying ER, supine biceps curl, table towel slide    Consulted and Agree with Plan of Care  Patient       Patient will benefit  from skilled therapeutic intervention in order to improve the following deficits and impairments:  Pain, Improper body mechanics, Postural dysfunction, Increased muscle spasms, Decreased activity tolerance, Decreased range of motion, Decreased strength, Impaired UE functional use  Visit Diagnosis: 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 Left knee pain 01/10/2018  . Dental caries 09/18/2017  . Syncope and collapse 09/04/2017  . Left arm weakness 08/07/2017  . Numbness and tingling in both hands 07/31/2017  . Gastroesophageal reflux disease without esophagitis 07/31/2017  . Moderate episode of recurrent major depressive disorder (HCC) 07/31/2017  . 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 Chronic pain disorder 06/05/2017  . Supratherapeutic INR 04/27/2017  . Gastrointestinal hemorrhage with melena   . Blood loss anemia 04/25/2017  . Pulmonary embolism (HCC) 02/14/2017  . Fatigue associated with anemia   . Post-operative pain   . Radicular pain   . Diabetes mellitus type 2 in nonobese (HCC)   . Surgery, elective   . Postoperative pain   . Leukocytosis   . Thrombocytopenia (HCC)   . History of  myocardial infarction   . Cocaine abuse (HCC)   . Depression   . Type 2 diabetes mellitus with hyperglycemia, without long-term current use of insulin (HCC)   . Stage 3 chronic kidney disease (HCC)   . Benign essential HTN   . History of CVA (cerebrovascular accident)   . Prediabetes   . Cervical spondylosis with radiculopathy 01/24/2017  . Dyslipidemia 03/15/2016  . Unstable angina (HCC) - chest pain with complete heart block & chest pain 03/15/2016  . Complete heart block (HCC) 03/14/2016  . Cervical spinal stenosis 03/14/2016  . Chest pain with moderate risk of acute coronary syndrome 03/13/2016   . CAD-50% LAD 2011 12/14/2014  . Gout 11/16/2014  . Acute intractable headache 07/15/2013  . Polysubstance abuse (HCC) 01/15/2013  . DIZZINESS 06/05/2010  . Essential hypertension 05/18/2009  . Acute myocardial infarction (HCC) 05/18/2009  . MUSCLE SPASM 05/18/2009    Nedra Hai PT, DPT, CBIS  Supplemental Physical Therapist [facility]   Pager [phone]   [facility] Outpatient Rehabilitation [facility] [address] [place], [place], [address] Phone: [phone]   Fax:  [phone]  Name: Russell Carter MRN: [id number] Date of Birth: 10-24-1955

## 2018-03-03 ENCOUNTER — Encounter: Payer: Self-pay | Admitting: Physical Therapy

## 2018-03-03 ENCOUNTER — Other Ambulatory Visit: Payer: Self-pay | Admitting: Internal Medicine

## 2018-03-03 ENCOUNTER — Ambulatory Visit: Payer: Medicaid Other | Admitting: Physical Therapy

## 2018-03-03 DIAGNOSIS — M6281 Muscle weakness (generalized): Secondary | ICD-10-CM

## 2018-03-03 DIAGNOSIS — M79602 Pain in left arm: Secondary | ICD-10-CM

## 2018-03-03 DIAGNOSIS — R51 Headache: Principal | ICD-10-CM

## 2018-03-03 DIAGNOSIS — R519 Headache, unspecified: Secondary | ICD-10-CM

## 2018-03-03 DIAGNOSIS — G540 Brachial plexus disorders: Secondary | ICD-10-CM

## 2018-03-03 DIAGNOSIS — R278 Other lack of coordination: Secondary | ICD-10-CM

## 2018-03-03 MED FILL — MECLIZINE 25 MG TABLET: 25 | 10 days supply | Qty: 30 | Fill #0

## 2018-03-03 MED FILL — WARFARIN SODIUM 5 MG TABLET: 5 | 30 days supply | Qty: 30 | Fill #1

## 2018-03-03 NOTE — Patient Instructions (Signed)
   Pec/Ant Deltoid stretch  Stand sideways from wall or door frame, with your arm fully stretch at chest height, hold the wall or doorframe, and move your body away from the wall/door until you feel stretch on the front of shoulder/chest/forearm.   Hold for at least 30 seconds, then relax.  Repeat 3 times with your left arm, twice a day (morning and evening)

## 2018-03-03 NOTE — Therapy (Signed)
[facility] [address] [place], [place], [address] Phone: [phone]   Fax:  [phone]  Physical Therapy Treatment  Patient Details  Name: Russell Carter MRN: [id number] Date of Birth: 1956/05/25 Referring Provider: Maeola Harman, MD   Encounter Date: 03/03/2018  PT End of Session - 03/03/18 1543    Visit Number  7    Number of Visits  20    Date for PT Re-Evaluation  04/11/18    Authorization Type  MCD re Berkley Harvey submitted 4/15    Authorization - Visit Number  3    Authorization - Number of Visits  12    PT Start Time  1500    PT Stop Time  1540    PT Time Calculation (min)  40 min    Activity Tolerance  Patient tolerated treatment well    Behavior During Therapy  [facility] for tasks assessed/performed       Past Medical History:  Diagnosis Date  . Anginal pain (HCC)   . Arthritis   . Blood in stool   . CAD (coronary artery disease)    a. 2011: cath showing mod non-obstructive disease b. 02/2016: cath with 95% stenosis in the Mid RCA. DES placed.  . Cervical stenosis of spine   . Cocaine abuse (HCC)    history  . Depression   . Diabetes mellitus without complication (HCC)   . Gout, unspecified   . Headache   . HTN (hypertension)   . Hypercholesterolemia   . Hyperglycemia   . Migraines   . Myocardial infarction [facility]) 2008   drug cocaine and marijuana at time of mi none  since    . Pneumonia   . Renal insufficiency   . Stroke [facility])     Past Surgical History:  Procedure Laterality Date  . ANTERIOR CERVICAL DECOMP/DISCECTOMY FUSION N/A 01/24/2017   Procedure: Cervical Four-Five,Cervical Five Six,Cervical Six-Seven Anterior cervical discectomy with fusion and plate with Cervical Three-Thoracic- One Laminectomy/Foraminotomy with Cervical Three-Thoracic- One Fixation and fusion;  Surgeon: Loura Halt Ditty, MD;  Location[facility] OR;  Service: Neurosurgery;  Laterality: N/A;  Part-1 anterior approach  . CARDIAC  CATHETERIZATION  2008; ~ 2011   [place]-40%lad  . CARDIAC CATHETERIZATION N/A 03/16/2016   Procedure: Left Heart Cath and Coronary Angiography;  Surgeon: Marykay Lex, MD;  Location: [facility] INVASIVE CV LAB;  Service: Cardiovascular;  Laterality: N/A;  . CORONARY ANGIOPLASTY     [place]  . EYE SURGERY Bilateral    "for double vision"  . INGUINAL HERNIA REPAIR  05/12/2012   Procedure: HERNIA REPAIR INGUINAL ADULT;  Surgeon: Liz Malady, MD;  Location: [facility];  Service: General;  Laterality: Right;  . INGUINAL HERNIA REPAIR Left 02/02/2003   Dr. Violeta Gelinas. repair with mesh. Same procedure done again in  05/22/2004   . INGUINAL HERNIA REPAIR Left ?date 2nd OR  . POSTERIOR CERVICAL FUSION/FORAMINOTOMY N/A 01/24/2017   Procedure: Cervical Three-Thoracic One-Laminectomy/Foraminotomy with Cervical Three-Thoracic One Fixation and fusion;  Surgeon: Loura Halt Ditty, MD;  Location: [facility] OR;  Service: Neurosurgery;  Laterality: N/A;  Part-2 Posterior approach  . RECONSTRUCT / STABILIZE DISTAL ULNA    . SHOULDER OPEN ROTATOR CUFF REPAIR Left 2010  . SHOULDER SURGERY Left    "injured on the job; had to cut small bone out"  . THUMB FUSION Right     There were no vitals filed for this visit.  Subjective Assessment - 03/03/18 1501    Subjective  My  shoulder felt great after last session, then the spasm took back over. I did not do anything since last therapy session, I was sick all weekend. I am out of Meclazine and a litlte dizzy. Exercises at home are going welll.     Patient Stated Goals  decrease pain, play basketball/tennis, lift weights, armed securty, karate, dress/cook independently    Currently in Pain?  Yes    Pain Score  9     Pain Location  -- L shoulder, back, neck     Pain Orientation  Left    Pain Descriptors / Indicators  Spasm;Stabbing;Sharp    Pain Type  Chronic pain    Pain Radiating Towards  down L UE down to fingers/wrist     Pain Onset  More than a month  ago    Pain Frequency  Constant    Aggravating Factors   when spasm gets bad    Pain Relieving Factors  exercise, massage     Effect of Pain on Daily Activities  severe                       [facility] Adult PT Treatment/Exercise - 03/03/18 0001      Shoulder Exercises: Supine   Flexion  AROM;Strengthening;10 reps x2 sets; 2#     ABduction  AROM;Strengthening;10 reps x2 sets; 2# for strength     Other Supine Exercises  dynamic stabilization x5 rounds     Other Supine Exercises  serratus punches 1x10 L 2#       Shoulder Exercises: Sidelying   Other Sidelying Exercises  ER 1x10 2# mod cues       Shoulder Exercises: Standing   Protraction  -- attempted, unable with correct form     Theraband Level (Shoulder Protraction)  --    Extension  Both;20 reps;Theraband    Theraband Level (Shoulder Row)  Level 2 (Red)    Retraction  Strengthening;Both      Shoulder Exercises: Stretch   Other Shoulder Stretches  door way stretch 3x30 seconds       Manual Therapy   Manual Therapy  Soft tissue mobilization    Manual therapy comments  separate from all other skilled services     Soft tissue mobilization  IASTM & STM Lt upper trap, supra & infraspinatus, rhomboids, scalenes, levator             PT Education - 03/03/18 1543    Education provided  Yes    Education Details  HEP updates, reasons for progression method/pace with PT     Person(s) Educated  Patient    Methods  Explanation;Handout    Comprehension  Verbalized understanding;Need further instruction       PT Short Term Goals - 02/03/18 1502      PT SHORT TERM GOAL #1   Title  Pt will be able to use scapular retraction through the day to provide support to postural alignment     Baseline  I have learned to sit up and relax which has been comfortable and decreases pain    Status  Achieved      PT SHORT TERM GOAL #2   Title  Pt will demo proper form with HEP for necessary challenge to joint ROM and muscular  activation    Baseline  able to demo    Status  Achieved        PT Long Term Goals - 02/03/18 1504      PT LONG  TERM GOAL #1   Title  Pt will be able to dress independently with minimal difficulty    Baseline  it is getting a little better but I still need some help- overhead dressing; now able to don own pants and belt    Time  14    Period  Weeks    Status  On-going    Target Date  04/11/18 to accomodate for MCD authorization periods      PT LONG TERM GOAL #2   Title  Average pain <=5/10 to decrease pain limitations on daily activities    Baseline  average 7-8/10    Time  14    Period  Weeks    Status  On-going    Target Date  04/11/18      PT LONG TERM GOAL #3   Title  Pt will be able to use Left arm to cook for himself for return to independence    Baseline  lacking strength to lift pots, pans, etc    Time  14    Period  Weeks    Status  New    Target Date  04/11/18      PT LONG TERM GOAL #4   Title  Pt will be able to return to light sporting activities such as beginning karate and basketball to begin return to PLOF    Baseline  unable due to lack of strength and ROM    Time  14    Period  Weeks    Status  On-going    Target Date  04/11/18      PT LONG TERM GOAL #5   Title  Pt will be able to shave independently    Baseline  requires assistance    Time  14    Period  Weeks    Status  On-going    Target Date  04/11/18            Plan - 03/03/18 1544    Clinical Impression Statement  Patient arrives with increased pain and reports of increased spasm this weekend, 9/10 pain today but reports he felt great after last session. Continued with functional strengthening in gravity eliminated positions today as tolerated to progress functional strength and also worked on scapular stability during session. Finished today's session with manual/STM to area of spasm for pain relief.     Rehab Potential  Fair    PT Frequency  2x / week    PT Duration  8 weeks    PT  Treatment/Interventions  ADLs/Self Care Home Management;Cryotherapy;Electrical Stimulation;Ultrasound;Moist Heat;Iontophoresis 4mg /ml Dexamethasone;Therapeutic activities;Therapeutic exercise;Patient/family education;Manual techniques;Scar mobilization;Passive range of motion;Taping;Dry needling    PT Next Visit Plan  continue shoulder stretching and AROM, scapular challenges, door stretch, DN as appropriate     PT Home Exercise Plan  PROM elbow & GHJ flexion, scap retraction, cervical rotation+scap retraction, supine iso flx hold at 90, seated ER yellow tband with pillow; sidelying ER, supine biceps curl, table towel slide, door stretch     Consulted and Agree with Plan of Care  Patient       Patient will benefit from skilled therapeutic intervention in order to improve the following deficits and impairments:  Pain, Improper body mechanics, Postural dysfunction, Increased muscle spasms, Decreased activity tolerance, Decreased range of motion, Decreased strength, Impaired UE functional use  Visit Diagnosis: 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 Left knee pain 01/10/2018  . Dental caries 09/18/2017  . Syncope and collapse 09/04/2017  . Left arm weakness 08/07/2017  . Numbness and tingling in both hands 07/31/2017  . Gastroesophageal reflux disease without esophagitis 07/31/2017  . Moderate episode of recurrent major depressive disorder (HCC) 07/31/2017  . 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 Chronic pain disorder 06/05/2017  . Supratherapeutic INR 04/27/2017  . Gastrointestinal hemorrhage with melena   . Blood loss anemia 04/25/2017  . Pulmonary embolism (HCC) 02/14/2017  . Fatigue associated with anemia   . Post-operative pain   . Radicular pain   . Diabetes mellitus type 2 in nonobese  (HCC)   . Surgery, elective   . Postoperative pain   . Leukocytosis   . Thrombocytopenia (HCC)   . History of myocardial infarction   . Cocaine abuse (HCC)   . Depression   . Type 2 diabetes mellitus with hyperglycemia, without long-term current use of insulin (HCC)   . Stage 3 chronic kidney disease (HCC)   . Benign essential HTN   . History of CVA (cerebrovascular accident)   . Prediabetes   . Cervical spondylosis with radiculopathy 01/24/2017  . Dyslipidemia 03/15/2016  . Unstable angina (HCC) - chest pain with complete heart block & chest pain 03/15/2016  . Complete heart block (HCC) 03/14/2016  . Cervical spinal stenosis 03/14/2016  . Chest pain with moderate risk of acute coronary syndrome 03/13/2016  . CAD-50% LAD 2011 12/14/2014  . Gout 11/16/2014  . Acute intractable headache 07/15/2013  . Polysubstance abuse (HCC) 01/15/2013  . DIZZINESS 06/05/2010  . Essential hypertension 05/18/2009  . Acute myocardial infarction (HCC) 05/18/2009  . MUSCLE SPASM 05/18/2009    Nedra Hai PT, DPT, CBIS  Supplemental Physical Therapist [facility]   Pager [phone]   [facility] [address] [place], [place], [address] Phone: [phone]   Fax:  [phone]  Name: Russell Carter MRN: [id number] Date of Birth: 1955-12-16

## 2018-03-04 ENCOUNTER — Encounter: Payer: Self-pay | Admitting: Internal Medicine

## 2018-03-04 ENCOUNTER — Ambulatory Visit: Payer: Medicaid Other | Attending: Internal Medicine | Admitting: Internal Medicine

## 2018-03-04 VITALS — BP 166/98 | HR 71 | Temp 98.3°F | Resp 16 | Wt 217.6 lb

## 2018-03-04 DIAGNOSIS — K219 Gastro-esophageal reflux disease without esophagitis: Secondary | ICD-10-CM | POA: Diagnosis not present

## 2018-03-04 DIAGNOSIS — E785 Hyperlipidemia, unspecified: Secondary | ICD-10-CM | POA: Insufficient documentation

## 2018-03-04 DIAGNOSIS — F329 Major depressive disorder, single episode, unspecified: Secondary | ICD-10-CM | POA: Insufficient documentation

## 2018-03-04 DIAGNOSIS — M109 Gout, unspecified: Secondary | ICD-10-CM | POA: Diagnosis not present

## 2018-03-04 DIAGNOSIS — Z808 Family history of malignant neoplasm of other organs or systems: Secondary | ICD-10-CM | POA: Diagnosis not present

## 2018-03-04 DIAGNOSIS — F419 Anxiety disorder, unspecified: Secondary | ICD-10-CM | POA: Diagnosis not present

## 2018-03-04 DIAGNOSIS — R51 Headache: Secondary | ICD-10-CM

## 2018-03-04 DIAGNOSIS — Z981 Arthrodesis status: Secondary | ICD-10-CM | POA: Diagnosis not present

## 2018-03-04 DIAGNOSIS — E1122 Type 2 diabetes mellitus with diabetic chronic kidney disease: Secondary | ICD-10-CM | POA: Diagnosis not present

## 2018-03-04 DIAGNOSIS — Z86711 Personal history of pulmonary embolism: Secondary | ICD-10-CM | POA: Insufficient documentation

## 2018-03-04 DIAGNOSIS — G8929 Other chronic pain: Secondary | ICD-10-CM | POA: Diagnosis not present

## 2018-03-04 DIAGNOSIS — R7303 Prediabetes: Secondary | ICD-10-CM | POA: Diagnosis not present

## 2018-03-04 DIAGNOSIS — Z79899 Other long term (current) drug therapy: Secondary | ICD-10-CM | POA: Insufficient documentation

## 2018-03-04 DIAGNOSIS — R531 Weakness: Secondary | ICD-10-CM | POA: Insufficient documentation

## 2018-03-04 DIAGNOSIS — R519 Headache, unspecified: Secondary | ICD-10-CM

## 2018-03-04 DIAGNOSIS — I10 Essential (primary) hypertension: Secondary | ICD-10-CM | POA: Diagnosis not present

## 2018-03-04 DIAGNOSIS — Z8249 Family history of ischemic heart disease and other diseases of the circulatory system: Secondary | ICD-10-CM | POA: Insufficient documentation

## 2018-03-04 DIAGNOSIS — I252 Old myocardial infarction: Secondary | ICD-10-CM | POA: Diagnosis not present

## 2018-03-04 DIAGNOSIS — Z91013 Allergy to seafood: Secondary | ICD-10-CM | POA: Insufficient documentation

## 2018-03-04 DIAGNOSIS — M4722 Other spondylosis with radiculopathy, cervical region: Secondary | ICD-10-CM | POA: Diagnosis not present

## 2018-03-04 DIAGNOSIS — I25110 Atherosclerotic heart disease of native coronary artery with unstable angina pectoris: Secondary | ICD-10-CM | POA: Diagnosis not present

## 2018-03-04 DIAGNOSIS — Z8673 Personal history of transient ischemic attack (TIA), and cerebral infarction without residual deficits: Secondary | ICD-10-CM | POA: Insufficient documentation

## 2018-03-04 DIAGNOSIS — N183 Chronic kidney disease, stage 3 (moderate): Secondary | ICD-10-CM | POA: Insufficient documentation

## 2018-03-04 DIAGNOSIS — R42 Dizziness and giddiness: Secondary | ICD-10-CM | POA: Diagnosis not present

## 2018-03-04 DIAGNOSIS — Z7901 Long term (current) use of anticoagulants: Secondary | ICD-10-CM | POA: Insufficient documentation

## 2018-03-04 DIAGNOSIS — Z955 Presence of coronary angioplasty implant and graft: Secondary | ICD-10-CM | POA: Diagnosis not present

## 2018-03-04 DIAGNOSIS — I129 Hypertensive chronic kidney disease with stage 1 through stage 4 chronic kidney disease, or unspecified chronic kidney disease: Secondary | ICD-10-CM | POA: Diagnosis not present

## 2018-03-04 LAB — CBC
Hematocrit: 40.7 % (ref 37.5–51.0)
Hemoglobin: 13.7 g/dL (ref 13.0–17.7)
MCH: 29.7 pg (ref 26.6–33.0)
MCHC: 33.7 g/dL (ref 31.5–35.7)
MCV: 88 fL (ref 79–97)
PLATELETS: 189 10*3/uL (ref 150–379)
RBC: 4.61 x10E6/uL (ref 4.14–5.80)
RDW: 14.9 % (ref 12.3–15.4)
WBC: 4.1 10*3/uL (ref 3.4–10.8)

## 2018-03-04 LAB — POCT GLYCOSYLATED HEMOGLOBIN (HGB A1C): HEMOGLOBIN A1C: 5.8

## 2018-03-04 LAB — GLUCOSE, POCT (MANUAL RESULT ENTRY): POC GLUCOSE: 136 mg/dL — AB (ref 70–99)

## 2018-03-04 LAB — PROTIME-INR
INR: 4.3 — AB (ref 0.8–1.2)
PROTHROMBIN TIME: 45.3 s — AB (ref 9.1–12.0)

## 2018-03-04 MED ORDER — MECLIZINE HCL 25 MG PO TABS: ORAL_TABLET | ORAL | 2 refills | Status: DC

## 2018-03-04 MED FILL — ROSUVASTATIN CALCIUM 40 MG: 40 | 30 days supply | Qty: 30 | Fill #2

## 2018-03-04 NOTE — Patient Instructions (Signed)
Take your blood pressure medication as soon as you return home today.   Take the Meclizine daily for one week then as needed thereafter.

## 2018-03-04 NOTE — Progress Notes (Signed)
Patient ID: Russell Carter, male    DOB: 1956-05-01  MRN: [id number]  CC: re-establish and Dizziness   Subjective: Russell Carter is a 62 y.o. male who presents for chronic ds management and to est with me as PCP His concerns today include:  Pt with hx of DM type 1, HTN, HL, CAD,anxiety, gout, dep/anxiety   DM: reports being dx with DM about 15 yrs ago.  Was on Metformin for a while but was eventually able to get off it.  Controls it with good eating habits  HTN/CAD/HL:  Compliant with Losartan and restricts salt intake -no CP/SOB/LE edema/HA -Blood pressure is elevated but he has not taken his medicine as yet for today. Positive dizziness x 4 days.  He also feels the dizziness is worse since being out of meclizine for 10 days.  He apparently was taking it 3 times a day for vertigo for quite some time.  He also requested to have his Coumadin level checked as  he had a past episode where he was having acute dizziness, and INR was found to be 12 and he had to be admitted with a bleed.  His dizziness is most noticable when he stands up or sits up.  Stools are dark ever since he has been on iron  Dep/Anxiety: seeing a psychiatrist at Dr. Flo Shanks office ([facility]).  On Cymbalta and another med from the psychiatrist on 2 other medications 1 of which is for sleep but he does not recall the names.   Patient Active Problem List   Diagnosis Date Noted  . Left knee pain 01/10/2018  . Dental caries 09/18/2017  . Syncope and collapse 09/04/2017  . Left arm weakness 08/07/2017  . Numbness and tingling in both hands 07/31/2017  . Gastroesophageal reflux disease without esophagitis 07/31/2017  . Moderate episode of recurrent major depressive disorder (HCC) 07/31/2017  . 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 Chronic pain disorder 06/05/2017  . Supratherapeutic INR 04/27/2017  . Gastrointestinal hemorrhage with melena   . Blood loss anemia 04/25/2017  .  Pulmonary embolism (HCC) 02/14/2017  . Fatigue associated with anemia   . Post-operative pain   . Radicular pain   . Diabetes mellitus type 2 in nonobese (HCC)   . Surgery, elective   . Postoperative pain   . Leukocytosis   . Thrombocytopenia (HCC)   . History of myocardial infarction   . Cocaine abuse (HCC)   . Depression   . Type 2 diabetes mellitus with hyperglycemia, without long-term current use of insulin (HCC)   . Stage 3 chronic kidney disease (HCC)   . Benign essential HTN   . History of CVA (cerebrovascular accident)   . Prediabetes   . Cervical spondylosis with radiculopathy 01/24/2017  . Dyslipidemia 03/15/2016  . Unstable angina (HCC) - chest pain with complete heart block & chest pain 03/15/2016  . Complete heart block (HCC) 03/14/2016  . Cervical spinal stenosis 03/14/2016  . Chest pain with moderate risk of acute coronary syndrome 03/13/2016  . CAD-50% LAD 2011 12/14/2014  . Gout 11/16/2014  . Acute intractable headache 07/15/2013  . Polysubstance abuse (HCC) 01/15/2013  . DIZZINESS 06/05/2010  . Essential hypertension 05/18/2009  . Acute myocardial infarction (HCC) 05/18/2009  . MUSCLE SPASM 05/18/2009     Current Outpatient Medications on File Prior to Visit  Medication Sig Dispense Refill  . allopurinol (ZYLOPRIM) 300 MG tablet Take 1 tablet (300 mg total) by mouth daily. 90 tablet 6  .  ALPRAZolam (XANAX) 0.5 MG tablet Take 1 tablet (0.5 mg total) by mouth 2 (two) times daily as needed for anxiety. 20 tablet 0  . CALCIUM-VITAMIN D PO Take 1 tablet by mouth daily.    . divalproex (DEPAKOTE) 250 MG DR tablet Take 250 mg by mouth 3 (three) times daily.    . DULoxetine (CYMBALTA) 30 MG capsule Take 1 capsule (30 mg total) by mouth 2 (two) times daily. 60 capsule 3  . ferrous sulfate 325 (65 FE) MG tablet Take 325 mg by mouth 3 (three) times daily with meals.    . gabapentin (NEURONTIN) 400 MG capsule Take 1 capsule (400 mg total) 2 (two) times daily by mouth.  180 capsule 3  . gabapentin (NEURONTIN) 600 MG tablet Take 600 mg by mouth 3 (three) times daily.  0  . hydrOXYzine (ATARAX/VISTARIL) 25 MG tablet TAKE 1 TABLET BY MOUTH EVERY 6 HOURS AS NEEDED FOR ANXIETY 30 tablet 0  . ibuprofen (ADVIL,MOTRIN) 800 MG tablet Take 1 tablet (800 mg total) by mouth every 8 (eight) hours as needed. 30 tablet 0  . losartan (COZAAR) 100 MG tablet Take 1 tablet (100 mg total) by mouth daily. 30 tablet 0  . methocarbamol (ROBAXIN) 750 MG tablet TAKE 1 TABLET BY MOUTH EVERY 8 HOURS AS NEEDED FOR MUSCLE SPASMS. 90 tablet 1  . metoCLOPramide (REGLAN) 10 MG tablet TAKE 1 TABLET BY MOUTH EVERY 6 HOURS AS NEEDED FOR NAUSEA (OR HEADACHE). 10 tablet 0  . nitroGLYCERIN (NITROSTAT) 0.4 MG SL tablet Place 1 tablet (0.4 mg total) under the tongue every 5 (five) minutes as needed for chest pain. 25 tablet 4  . nitroGLYCERIN (NITROSTAT) 0.4 MG SL tablet PLACE 1 TABLET UNDER THE TONGUE EVERY 5 MINUTES AS NEEDED FOR CHEST PAIN 25 tablet 3  . omeprazole (PRILOSEC) 20 MG capsule Take 20 mg by mouth daily.  3  . OXYCODONE HCL PO Take 7.5 mg by mouth.    . pantoprazole (PROTONIX) 40 MG tablet Take 1 tablet (40 mg total) by mouth at bedtime. 30 tablet 1  . rosuvastatin (CRESTOR) 40 MG tablet Take 1 tablet (40 mg total) at bedtime by mouth. 30 tablet 2  . warfarin (COUMADIN) 5 MG tablet Take 1 tablet (5 mg total) by mouth daily. 30 tablet 3  . [DISCONTINUED] famotidine (PEPCID) 20 MG tablet Take 1 tablet (20 mg total) by mouth 2 (two) times daily. 30 tablet 0  . [DISCONTINUED] TACRINE HYDROCHLORIDE PO Take 25 mg by mouth daily.       No current facility-administered medications on file prior to visit.     Allergies  Allergen Reactions  . Shellfish Allergy Anaphylaxis    Social History   Socioeconomic History  . Marital status: Widowed    Spouse name: Not on file  . Number of children: Not on file  . Years of education: Not on file  . Highest education level: Not on file    Occupational History  . Not on file  Social Needs  . Financial resource strain: Not on file  . Food insecurity:    Worry: Not on file    Inability: Not on file  . Transportation needs:    Medical: Not on file    Non-medical: Not on file  Tobacco Use  . Smoking status: Never Smoker  . Smokeless tobacco: Never Used  Substance and Sexual Activity  . Alcohol use: Yes    Comment: 16 oz beer every 2-3 days  . Drug use: Yes  Types: "Crack" cocaine, Marijuana, Cocaine    Comment: 03/14/2016 "quit 2008"  . Sexual activity: Not Currently  Lifestyle  . Physical activity:    Days per week: Not on file    Minutes per session: Not on file  . Stress: Not on file  Relationships  . Social connections:    Talks on phone: Not on file    Gets together: Not on file    Attends religious service: Not on file    Active member of club or organization: Not on file    Attends meetings of clubs or organizations: Not on file    Relationship status: Not on file  . Intimate partner violence:    Fear of current or ex partner: Not on file    Emotionally abused: Not on file    Physically abused: Not on file    Forced sexual activity: Not on file  Other Topics Concern  . Not on file  Social History Narrative   Lives in [place] with his wife and son.     Family History  Problem Relation Age of Onset  . Heart attack Mother        x7. Still alive  . Heart attack Father        deceased b/c of MI  . Heart attack Brother        older, deceased b/c of MI   . Cancer Brother        bone cancer in 1 brother, ? brain cancer in other brother    Past Surgical History:  Procedure Laterality Date  . ANTERIOR CERVICAL DECOMP/DISCECTOMY FUSION N/A 01/24/2017   Procedure: Cervical Four-Five,Cervical Five Six,Cervical Six-Seven Anterior cervical discectomy with fusion and plate with Cervical Three-Thoracic- One Laminectomy/Foraminotomy with Cervical Three-Thoracic- One Fixation and fusion;  Surgeon:  Loura Halt Ditty, MD;  Location: [facility] OR;  Service: Neurosurgery;  Laterality: N/A;  Part-1 anterior approach  . CARDIAC CATHETERIZATION  2008; ~ 2011   [place]-40%lad  . CARDIAC CATHETERIZATION N/A 03/16/2016   Procedure: Left Heart Cath and Coronary Angiography;  Surgeon: Marykay Lex, MD;  Location: [facility] INVASIVE CV LAB;  Service: Cardiovascular;  Laterality: N/A;  . CORONARY ANGIOPLASTY     STENTS  . EYE SURGERY Bilateral    "for double vision"  . INGUINAL HERNIA REPAIR  05/12/2012   Procedure: HERNIA REPAIR INGUINAL ADULT;  Surgeon: Liz Malady, MD;  Location: [facility];  Service: General;  Laterality: Right;  . INGUINAL HERNIA REPAIR Left 02/02/2003   Dr. Violeta Gelinas. repair with mesh. Same procedure done again in  05/22/2004   . INGUINAL HERNIA REPAIR Left ?date 2nd OR  . POSTERIOR CERVICAL FUSION/FORAMINOTOMY N/A 01/24/2017   Procedure: Cervical Three-Thoracic One-Laminectomy/Foraminotomy with Cervical Three-Thoracic One Fixation and fusion;  Surgeon: Loura Halt Ditty, MD;  Location: [facility] OR;  Service: Neurosurgery;  Laterality: N/A;  Part-2 Posterior approach  . RECONSTRUCT / STABILIZE DISTAL ULNA    . SHOULDER OPEN ROTATOR CUFF REPAIR Left 2010  . SHOULDER SURGERY Left    "injured on the job; had to cut small bone out"  . THUMB FUSION Right     ROS: Review of Systems Negative except as stated above PHYSICAL EXAM: BP (!) 166/98   Pulse 71   Temp 98.3 F (36.8 C) (Oral)   Resp 16   Wt 217 lb 9.6 oz (98.7 kg)   SpO2 95%   BMI 30.35 kg/m   Blood pressure sitting 160/90, blood pressure standing 150/90 Physical Exam  General appearance - alert, well appearing, and in no distress Mental status - normal mood, behavior, speech, dress, motor activity, and thought processes Eyes - pupils equal and reactive, extraocular eye movements intact Neck - supple, no significant adenopathy Chest - clear to auscultation, no wheezes, rales or rhonchi, symmetric air  entry Heart - normal rate, regular rhythm, normal S1, S2, no murmurs, rubs, clicks or gallops Neurological - CNS intact.  Weakness of 4/5 LUE prox/distal.  5/5 RUE.  Ambulates with cane Extremities - no LE edema  Lab Results  Component Value Date   HGBA1C 5.8 03/04/2018   BS 136 A1C 5.8  Results for orders placed or performed in visit on 03/04/18  CBC  Result Value Ref Range   WBC 4.1 3.4 - 10.8 x10E3/uL   RBC 4.61 4.14 - 5.80 x10E6/uL   Hemoglobin 13.7 13.0 - 17.7 g/dL   Hematocrit 16.1 09.6 - 51.0 %   MCV 88 79 - 97 fL   MCH 29.7 26.6 - 33.0 pg   MCHC 33.7 31.5 - 35.7 g/dL   RDW 04.5 40.9 - 81.1 %   Platelets 189 150 - 379 x10E3/uL  Comprehensive metabolic panel  Result Value Ref Range   Glucose 130 (H) 65 - 99 mg/dL   BUN 11 8 - 27 mg/dL   Creatinine, Ser 9.14 0.76 - 1.27 mg/dL   GFR calc non Af Amer 67 >59 mL/min/1.73   GFR calc Af Amer 78 >59 mL/min/1.73   BUN/Creatinine Ratio 10 10 - 24   Sodium 147 (H) 134 - 144 mmol/L   Potassium 4.3 3.5 - 5.2 mmol/L   Chloride 108 (H) 96 - 106 mmol/L   CO2 23 20 - 29 mmol/L   Calcium 9.3 8.6 - 10.2 mg/dL   Total Protein 6.3 6.0 - 8.5 g/dL   Albumin 3.9 3.6 - 4.8 g/dL   Globulin, Total 2.4 1.5 - 4.5 g/dL   Albumin/Globulin Ratio 1.6 1.2 - 2.2   Bilirubin Total 0.3 0.0 - 1.2 mg/dL   Alkaline Phosphatase 55 39 - 117 IU/L   AST 22 0 - 40 IU/L   ALT 14 0 - 44 IU/L  Protime-INR  Result Value Ref Range   INR 4.3 (H) 0.8 - 1.2   Prothrombin Time 45.3 (H) 9.1 - 12.0 sec  POCT glucose (manual entry)  Result Value Ref Range   POC Glucose 136 (A) 70 - 99 mg/dl  POCT glycosylated hemoglobin (Hb A1C)  Result Value Ref Range   Hemoglobin A1C 5.8     ASSESSMENT AND PLAN: 1. Dizziness Patient's neurologic exam reveals some weakness in the left upper extremity but patient states that this is not new and no worse than previous.  It has been this way since he had surgery on the cervical spine. -I am surprised that he was on meclizine  3 times a day as a standing dose.  I think this can cause some rebound dizziness when the medication is stopped.  He has been off of the medicine for 10 days.  I recommend restarting and taking once a day for a week and then only as needed -Go slow with position changes. Check CBC and INR today. - CBC - Comprehensive metabolic panel  2. Prediabetes Encouraged him to continue healthy eating habits and regular exercise - POCT glucose (manual entry) - POCT glycosylated hemoglobin (Hb A1C)  3. Essential hypertension Not at goal.  Advised patient to take his blood pressure medicine when he returns home  4. Cervical  spondylosis with radiculopathy Currently doing physical therapy  5. History of pulmonary embolism - Protime-INR  6. Anxiety and depression Followed by psychiatry  7. Anticoagulated - Protime-INR  Addendum:  PC placed to pt after I received the results of the INR.  Pt is on Coumadin 5 mg daily.  Pt told to hold Coumadin tonight, then 5 mg every day except mondays where he should take 1/2 a tab.  Pt was able to repeat back instructions correctly.  Message will be sent to clinical pharmacist to inquire whether he still wants to see pt on the 03/06/2018 or move the date to next week. Patient was given the opportunity to ask questions.  Patient verbalized understanding of the plan and was able to repeat key elements of the plan.    Requested Prescriptions   Signed Prescriptions Disp Refills  . meclizine (ANTIVERT) 25 MG tablet 30 tablet 2    Sig: 1 tab PO daily x 1 week then daily PRN    Return in about 7 weeks (around 04/22/2018) for physical.  Jonah Blue, MD, Jerrel Ivory

## 2018-03-05 ENCOUNTER — Encounter: Payer: Self-pay | Admitting: Physical Therapy

## 2018-03-05 ENCOUNTER — Ambulatory Visit: Payer: Medicaid Other | Admitting: Physical Therapy

## 2018-03-05 DIAGNOSIS — M6281 Muscle weakness (generalized): Secondary | ICD-10-CM

## 2018-03-05 DIAGNOSIS — G540 Brachial plexus disorders: Secondary | ICD-10-CM

## 2018-03-05 DIAGNOSIS — M79602 Pain in left arm: Secondary | ICD-10-CM

## 2018-03-05 NOTE — Therapy (Signed)
[facility] [address] [place], [place], [address] Phone: [phone]   Fax:  [phone]  Physical Therapy Treatment  Patient Details  Name: Russell Carter MRN: [id number] Date of Birth: Oct 24, 1955 Referring Provider: Maeola Harman, MD   Encounter Date: 03/05/2018  PT End of Session - 03/05/18 1501    Visit Number  8    Number of Visits  20    Date for PT Re-Evaluation  04/11/18    PT Start Time  1501    PT Stop Time  1549    PT Time Calculation (min)  48 min    Activity Tolerance  Patient tolerated treatment well    Behavior During Therapy  [facility] for tasks assessed/performed       Past Medical History:  Diagnosis Date  . Anginal pain (HCC)   . Arthritis   . Blood in stool   . CAD (coronary artery disease)    a. 2011: cath showing mod non-obstructive disease b. 02/2016: cath with 95% stenosis in the Mid RCA. DES placed.  . Cervical stenosis of spine   . Cocaine abuse (HCC)    history  . Depression   . Diabetes mellitus without complication (HCC)   . Gout, unspecified   . Headache   . HTN (hypertension)   . Hypercholesterolemia   . Hyperglycemia   . Migraines   . Myocardial infarction [facility]) 2008   drug cocaine and marijuana at time of mi none  since    . Pneumonia   . Renal insufficiency   . Stroke [facility])     Past Surgical History:  Procedure Laterality Date  . ANTERIOR CERVICAL DECOMP/DISCECTOMY FUSION N/A 01/24/2017   Procedure: Cervical Four-Five,Cervical Five Six,Cervical Six-Seven Anterior cervical discectomy with fusion and plate with Cervical Three-Thoracic- One Laminectomy/Foraminotomy with Cervical Three-Thoracic- One Fixation and fusion;  Surgeon: Loura Halt Ditty, MD;  Location: [facility] OR;  Service: Neurosurgery;  Laterality: N/A;  Part-1 anterior approach  . CARDIAC CATHETERIZATION  2008; ~ 2011   [place]-40%lad  . CARDIAC CATHETERIZATION N/A 03/16/2016   Procedure: Left Heart Cath and Coronary  Angiography;  Surgeon: Marykay Lex, MD;  Location: [facility] INVASIVE CV LAB;  Service: Cardiovascular;  Laterality: N/A;  . CORONARY ANGIOPLASTY     STENTS  . EYE SURGERY Bilateral    "for double vision"  . INGUINAL HERNIA REPAIR  05/12/2012   Procedure: HERNIA REPAIR INGUINAL ADULT;  Surgeon: Liz Malady, MD;  Location: [facility];  Service: General;  Laterality: Right;  . INGUINAL HERNIA REPAIR Left 02/02/2003   Dr. Violeta Gelinas. repair with mesh. Same procedure done again in  05/22/2004   . INGUINAL HERNIA REPAIR Left ?date 2nd OR  . POSTERIOR CERVICAL FUSION/FORAMINOTOMY N/A 01/24/2017   Procedure: Cervical Three-Thoracic One-Laminectomy/Foraminotomy with Cervical Three-Thoracic One Fixation and fusion;  Surgeon: Loura Halt Ditty, MD;  Location: [facility] OR;  Service: Neurosurgery;  Laterality: N/A;  Part-2 Posterior approach  . RECONSTRUCT / STABILIZE DISTAL ULNA    . SHOULDER OPEN ROTATOR CUFF REPAIR Left 2010  . SHOULDER SURGERY Left    "injured on the job; had to cut small bone out"  . THUMB FUSION Right     There were no vitals filed for this visit.  Subjective Assessment - 03/05/18 1501    Subjective  "I am doing pretty good since last session, still having about 6/10 pain today"     Currently in Pain?  Yes    Pain Score  6  Pain Orientation  Left    Pain Type  Chronic pain    Pain Onset  More than a month ago                       [facility] Adult PT Treatment/Exercise - 03/05/18 1542      Modalities   Modalities  Moist Heat      Moist Heat Therapy   Number Minutes Moist Heat  10 Minutes    Moist Heat Location  Cervical in prone      Manual Therapy   Manual Therapy  Joint mobilization    Manual therapy comments  skilled palpation and monitoring throughout TPDN    Joint Mobilization  grade 3-4 T3-T8 PA    Soft tissue mobilization  IASTM over the L upper / levator and rhomboids       Trigger Point Dry Needling - 03/05/18 1508     Consent Given?  Yes    Education Handout Provided  Yes    Muscles Treated Upper Body  Upper trapezius;Rhomboids;Levator scapulae    Upper Trapezius Response  Twitch reponse elicited;Palpable increased muscle length    Levator Scapulae Response  Twitch response elicited;Palpable increased muscle length    Rhomboids Response  Twitch response elicited;Palpable increased muscle length           PT Education - 03/05/18 1511    Education provided  Yes    Education Details  muscle anatomy and referral patterns. what TPDN is, benefits/ what to expect and after care.    Person(s) Educated  Patient    Methods  Explanation;Verbal cues;Handout    Comprehension  Verbalized understanding;Verbal cues required       PT Short Term Goals - 02/03/18 1502      PT SHORT TERM GOAL #1   Title  Pt will be able to use scapular retraction through the day to provide support to postural alignment     Baseline  I have learned to sit up and relax which has been comfortable and decreases pain    Status  Achieved      PT SHORT TERM GOAL #2   Title  Pt will demo proper form with HEP for necessary challenge to joint ROM and muscular activation    Baseline  able to demo    Status  Achieved        PT Long Term Goals - 02/03/18 1504      PT LONG TERM GOAL #1   Title  Pt will be able to dress independently with minimal difficulty    Baseline  it is getting a little better but I still need some help- overhead dressing; now able to don own pants and belt    Time  14    Period  Weeks    Status  On-going    Target Date  04/11/18 to accomodate for MCD authorization periods      PT LONG TERM GOAL #2   Title  Average pain <=5/10 to decrease pain limitations on daily activities    Baseline  average 7-8/10    Time  14    Period  Weeks    Status  On-going    Target Date  04/11/18      PT LONG TERM GOAL #3   Title  Pt will be able to use Left arm to cook for himself for return to independence    Baseline   lacking strength to lift pots, pans, etc  Time  14    Period  Weeks    Status  New    Target Date  04/11/18      PT LONG TERM GOAL #4   Title  Pt will be able to return to light sporting activities such as beginning karate and basketball to begin return to PLOF    Baseline  unable due to lack of strength and ROM    Time  14    Period  Weeks    Status  On-going    Target Date  04/11/18      PT LONG TERM GOAL #5   Title  Pt will be able to shave independently    Baseline  requires assistance    Time  14    Period  Weeks    Status  On-going    Target Date  04/11/18            Plan - 03/05/18 1544    Clinical Impression Statement  pt reports continued pain inthe shoulder and N/T in bil hands with R>L. Educated and performed TPDN along the L upper trap/ levator scapulae and Rhomboids followed with IASTM techniques and mid thoracic mobs. utilized MHP end of session which he reported no N/T in bil hands with minimal soreness end of session.     PT Treatment/Interventions  ADLs/Self Care Home Management;Cryotherapy;Electrical Stimulation;Ultrasound;Moist Heat;Iontophoresis 4mg /ml Dexamethasone;Therapeutic activities;Therapeutic exercise;Patient/family education;Manual techniques;Scar mobilization;Passive range of motion;Taping;Dry needling    PT Next Visit Plan  continue shoulder stretching and AROM, scapular challenges, door stretch, response to DN    PT Home Exercise Plan  PROM elbow & GHJ flexion, scap retraction, cervical rotation+scap retraction, supine iso flx hold at 90, seated ER yellow tband with pillow; sidelying ER, supine biceps curl, table towel slide, door stretch     Consulted and Agree with Plan of Care  Patient       Patient will benefit from skilled therapeutic intervention in order to improve the following deficits and impairments:  Pain, Improper body mechanics, Postural dysfunction, Increased muscle spasms, Decreased activity tolerance, Decreased range of motion,  Decreased strength, Impaired UE functional use  Visit Diagnosis: 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 Left knee pain 01/10/2018  . Left arm weakness 08/07/2017  . Numbness and tingling in both hands 07/31/2017  . Gastroesophageal reflux disease without esophagitis 07/31/2017  . Chronic pain disorder 06/05/2017  . Gastrointestinal hemorrhage with melena   . Pulmonary embolism (HCC) 02/14/2017  . Diabetes mellitus type 2 in nonobese (HCC)   . Cocaine abuse (HCC)   . Depression   . Stage 3 chronic kidney disease (HCC)   . History of CVA (cerebrovascular accident)   . Prediabetes   . Cervical spondylosis with radiculopathy 01/24/2017  . Dyslipidemia 03/15/2016  . Complete heart block (HCC) 03/14/2016  . Cervical spinal stenosis 03/14/2016  . CAD-50% LAD 2011 12/14/2014  . Gout 11/16/2014  . Polysubstance abuse (HCC) 01/15/2013  . DIZZINESS 06/05/2010  . Essential hypertension 05/18/2009   Lulu Riding PT, DPT, LAT, ATC  03/05/18  3:47 PM      [facility] [address] [place], [place], [address] Phone: [phone]   Fax:  [phone]  Name: Fher Wyndham MRN: [id number] Date of Birth: May 15, 1956

## 2018-03-06 ENCOUNTER — Ambulatory Visit: Payer: Self-pay | Admitting: Pharmacist

## 2018-03-08 LAB — COMPREHENSIVE METABOLIC PANEL
A/G RATIO: 1.6 (ref 1.2–2.2)
ALBUMIN: 3.9 g/dL (ref 3.6–4.8)
ALK PHOS: 55 IU/L (ref 39–117)
ALT: 14 IU/L (ref 0–44)
AST: 22 IU/L (ref 0–40)
BILIRUBIN TOTAL: 0.3 mg/dL (ref 0.0–1.2)
BUN / CREAT RATIO: 10 (ref 10–24)
BUN: 11 mg/dL (ref 8–27)
CO2: 23 mmol/L (ref 20–29)
Calcium: 9.3 mg/dL (ref 8.6–10.2)
Chloride: 108 mmol/L — ABNORMAL HIGH (ref 96–106)
Creatinine, Ser: 1.15 mg/dL (ref 0.76–1.27)
GFR calc non Af Amer: 67 mL/min/{1.73_m2} (ref >59)
GFR, EST AFRICAN AMERICAN: 78 mL/min/{1.73_m2} (ref >59)
GLOBULIN, TOTAL: 2.4 g/dL (ref 1.5–4.5)
Glucose: 130 mg/dL — ABNORMAL HIGH (ref 65–99)
POTASSIUM: 4.3 mmol/L (ref 3.5–5.2)
SODIUM: 147 mmol/L — AB (ref 134–144)
TOTAL PROTEIN: 6.3 g/dL (ref 6.0–8.5)

## 2018-03-09 ENCOUNTER — Other Ambulatory Visit: Payer: Self-pay | Admitting: Internal Medicine

## 2018-03-10 ENCOUNTER — Ambulatory Visit: Payer: Medicaid Other | Admitting: Physical Therapy

## 2018-03-10 ENCOUNTER — Telehealth: Payer: Self-pay

## 2018-03-10 ENCOUNTER — Encounter: Payer: Self-pay | Admitting: Physical Therapy

## 2018-03-10 DIAGNOSIS — M6281 Muscle weakness (generalized): Secondary | ICD-10-CM

## 2018-03-10 DIAGNOSIS — M79602 Pain in left arm: Secondary | ICD-10-CM

## 2018-03-10 DIAGNOSIS — G540 Brachial plexus disorders: Secondary | ICD-10-CM | POA: Diagnosis not present

## 2018-03-10 DIAGNOSIS — R278 Other lack of coordination: Secondary | ICD-10-CM

## 2018-03-10 NOTE — Therapy (Signed)
[facility]patient Rehabilitation [facility] [address] [place], [place], [address] Phone: [phone]   Fax:  [phone]  Physical Therapy Treatment  Patient Details  Name: Russell Carter MRN: [id number] Date of Birth: 1956/01/19 Referring Provider: Maeola Harman, MD   Encounter Date: 03/10/2018  PT End of Session - 03/10/18 1539    Visit Number  9    Number of Visits  20    Date for PT Re-Evaluation  04/11/18    Authorization Type  MCD re Berkley Harvey submitted 4/15    Authorization - Visit Number  4    Authorization - Number of Visits  12    PT Start Time  1500    PT Stop Time  1539    PT Time Calculation (min)  39 min    Activity Tolerance  Patient tolerated treatment well    Behavior During Therapy  [facility] for tasks assessed/performed       Past Medical History:  Diagnosis Date  . Anginal pain (HCC)   . Arthritis   . Blood in stool   . CAD (coronary artery disease)    a. 2011: cath showing mod non-obstructive disease b. 02/2016: cath with 95% stenosis in the Mid RCA. DES placed.  . Cervical stenosis of spine   . Cocaine abuse (HCC)    history  . Depression   . Diabetes mellitus without complication (HCC)   . Gout, unspecified   . Headache   . HTN (hypertension)   . Hypercholesterolemia   . Hyperglycemia   . Migraines   . Myocardial infarction [facility]) 2008   drug cocaine and marijuana at time of mi none  since    . Pneumonia   . Renal insufficiency   . Stroke [facility])     Past Surgical History:  Procedure Laterality Date  . ANTERIOR CERVICAL DECOMP/DISCECTOMY FUSION N/A 01/24/2017   Procedure: Cervical Four-Five,Cervical Five Six,Cervical Six-Seven Anterior cervical discectomy with fusion and plate with Cervical Three-Thoracic- One Laminectomy/Foraminotomy with Cervical Three-Thoracic- One Fixation and fusion;  Surgeon: Loura Halt Ditty, MD;  Location: [facility] OR;  Service: Neurosurgery;  Laterality: N/A;  Part-1 anterior approach  . CARDIAC  CATHETERIZATION  2008; ~ 2011   [place]-40%lad  . CARDIAC CATHETERIZATION N/A 03/16/2016   Procedure: Left Heart Cath and Coronary Angiography;  Surgeon: Marykay Lex, MD;  Location: [facility] INVASIVE CV LAB;  Service: Cardiovascular;  Laterality: N/A;  . CORONARY ANGIOPLASTY     STENTS  . EYE SURGERY Bilateral    "for double vision"  . INGUINAL HERNIA REPAIR  05/12/2012   Procedure: HERNIA REPAIR INGUINAL ADULT;  Surgeon: Liz Malady, MD;  Location: [facility];  Service: General;  Laterality: Right;  . INGUINAL HERNIA REPAIR Left 02/02/2003   Dr. Violeta Gelinas. repair with mesh. Same procedure done again in  05/22/2004   . INGUINAL HERNIA REPAIR Left ?date 2nd OR  . POSTERIOR CERVICAL FUSION/FORAMINOTOMY N/A 01/24/2017   Procedure: Cervical Three-Thoracic One-Laminectomy/Foraminotomy with Cervical Three-Thoracic One Fixation and fusion;  Surgeon: Loura Halt Ditty, MD;  Location: [facility] OR;  Service: Neurosurgery;  Laterality: N/A;  Part-2 Posterior approach  . RECONSTRUCT / STABILIZE DISTAL ULNA    . SHOULDER OPEN ROTATOR CUFF REPAIR Left 2010  . SHOULDER SURGERY Left    "injured on the job; had to cut small bone out"  . THUMB FUSION Right     There were no vitals filed for this visit.  Subjective Assessment - 03/10/18 1503    Subjective  I  am doing good, no major changes since last time. That spasm I have is better, my numbness in my left hand is gone but it came back into the right on sunday. I see my doctor on the 23rd.    Patient Stated Goals  decrease pain, play basketball/tennis, lift weights, armed securty, karate, dress/cook independently    Currently in Pain?  Yes    Pain Score  4     Pain Location  Shoulder    Pain Orientation  Left    Pain Descriptors / Indicators  Spasm;Stabbing;Sharp    Pain Type  Chronic pain    Pain Radiating Towards  none     Pain Onset  More than a month ago    Pain Frequency  Constant    Aggravating Factors   spasms getting worse     Pain Relieving Factors  exercise, massage     Effect of Pain on Daily Activities  severe                       [facility] Adult PT Treatment/Exercise - 03/10/18 0001      Shoulder Exercises: Supine   External Rotation  PROM;15 reps;AROM    Flexion  AROM;15 reps;PROM    ABduction  AROM;PROM  15 reps      Manual Therapy   Manual Therapy  Joint mobilization    Manual therapy comments  separate from all other skilled services     Joint Mobilization  L shoulder joint mobiliizations inferior and anterior grade II-III    Soft tissue mobilization  IASTM over the L upper / levator and rhomboids             PT Education - 03/10/18 1539    Education provided  Yes    Education Details  POC moving forward    Person(s) Educated  Patient    Methods  Explanation    Comprehension  Verbalized understanding       PT Short Term Goals - 02/03/18 1502      PT SHORT TERM GOAL #1   Title  Pt will be able to use scapular retraction through the day to provide support to postural alignment     Baseline  I have learned to sit up and relax which has been comfortable and decreases pain    Status  Achieved      PT SHORT TERM GOAL #2   Title  Pt will demo proper form with HEP for necessary challenge to joint ROM and muscular activation    Baseline  able to demo    Status  Achieved        PT Long Term Goals - 02/03/18 1504      PT LONG TERM GOAL #1   Title  Pt will be able to dress independently with minimal difficulty    Baseline  it is getting a little better but I still need some help- overhead dressing; now able to don own pants and belt    Time  14    Period  Weeks    Status  On-going    Target Date  04/11/18 to accomodate for MCD authorization periods      PT LONG TERM GOAL #2   Title  Average pain <=5/10 to decrease pain limitations on daily activities    Baseline  average 7-8/10    Time  14    Period  Weeks    Status  On-going    Target Date  04/11/18      PT LONG  TERM GOAL #3   Title  Pt will be able to use Left arm to cook for himself for return to independence    Baseline  lacking strength to lift pots, pans, etc    Time  14    Period  Weeks    Status  New    Target Date  04/11/18      PT LONG TERM GOAL #4   Title  Pt will be able to return to light sporting activities such as beginning karate and basketball to begin return to PLOF    Baseline  unable due to lack of strength and ROM    Time  14    Period  Weeks    Status  On-going    Target Date  04/11/18      PT LONG TERM GOAL #5   Title  Pt will be able to shave independently    Baseline  requires assistance    Time  14    Period  Weeks    Status  On-going    Target Date  04/11/18            Plan - 03/10/18 1539    Clinical Impression Statement  Patient arrives today with reduced pain and muscle spasm following dry needling last session, however does demonstrate increased L shoulder stiffness as compared to last treatment session. As such focused this session on PROM and AROM based activities, did not progress weight due to increased shoulder stiffness this session. He continues to demonstrate considerable muscle spasm in posterior L shoulder musculature however it is reduced from baseline following dry needling last session; continue to suspect possible fatty mass underlying muscle however refer to MD for further investigation/diagnosis/treatment regarding this area.     Rehab Potential  Fair    PT Frequency  2x / week    PT Duration  8 weeks    PT Treatment/Interventions  ADLs/Self Care Home Management;Cryotherapy;Electrical Stimulation;Ultrasound;Moist Heat;Iontophoresis 4mg /ml Dexamethasone;Therapeutic activities;Therapeutic exercise;Patient/family education;Manual techniques;Scar mobilization;Passive range of motion;Taping;Dry needling    PT Next Visit Plan  continue shoulder stretching and AROM, scapular challenges, door stretch, response to DN/continue DN as appropriate     PT  Home Exercise Plan  PROM elbow & GHJ flexion, scap retraction, cervical rotation+scap retraction, supine iso flx hold at 90, seated ER yellow tband with pillow; sidelying ER, supine biceps curl, table towel slide, door stretch     Consulted and Agree with Plan of Care  Patient       Patient will benefit from skilled therapeutic intervention in order to improve the following deficits and impairments:  Pain, Improper body mechanics, Postural dysfunction, Increased muscle spasms, Decreased activity tolerance, Decreased range of motion, Decreased strength, Impaired UE functional use  Visit Diagnosis: 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 Left knee pain 01/10/2018  . Left arm weakness 08/07/2017  . Numbness and tingling in both hands 07/31/2017  . Gastroesophageal reflux disease without esophagitis 07/31/2017  . Chronic pain disorder 06/05/2017  . Gastrointestinal hemorrhage with melena   . Pulmonary embolism (HCC) 02/14/2017  . Diabetes mellitus type 2 in nonobese (HCC)   . Cocaine abuse (HCC)   . Depression   . Stage 3 chronic kidney disease (HCC)   . History of CVA (cerebrovascular  accident)   . Prediabetes   . Cervical spondylosis with radiculopathy 01/24/2017  . Dyslipidemia 03/15/2016  . Complete heart block (HCC) 03/14/2016  . Cervical spinal stenosis 03/14/2016  . CAD-50% LAD 2011 12/14/2014  . Gout 11/16/2014  . Polysubstance abuse (HCC) 01/15/2013  . DIZZINESS 06/05/2010  . Essential hypertension 05/18/2009    Nedra Hai PT, DPT, CBIS  Supplemental Physical Therapist [facility]   Pager [phone]   [facility] Outpatient Rehabilitation [facility] [address] [place], [place], [address] Phone: [phone]   Fax:  [phone]  Name: Russell Carter MRN: [id number] Date of Birth:  September 19, 1956

## 2018-03-10 NOTE — Telephone Encounter (Signed)
Contacted pt to go over lab results pt is aware and doesn't have any questions or concerns 

## 2018-03-11 ENCOUNTER — Ambulatory Visit: Payer: Medicaid Other | Attending: Internal Medicine | Admitting: Pharmacist

## 2018-03-11 ENCOUNTER — Other Ambulatory Visit: Payer: Self-pay | Admitting: Family Medicine

## 2018-03-11 DIAGNOSIS — I2699 Other pulmonary embolism without acute cor pulmonale: Secondary | ICD-10-CM | POA: Diagnosis not present

## 2018-03-11 DIAGNOSIS — I2782 Chronic pulmonary embolism: Secondary | ICD-10-CM

## 2018-03-11 DIAGNOSIS — Z79899 Other long term (current) drug therapy: Secondary | ICD-10-CM | POA: Insufficient documentation

## 2018-03-11 LAB — POCT INR: INR: 2.3 (ref 2.0–3.0)

## 2018-03-11 MED FILL — ALLOPURINOL 300 MG TAB: 300 | 30 days supply | Qty: 30 | Fill #1

## 2018-03-11 MED FILL — LOSARTAN POTASSIUM 100 MG T: 100 | 30 days supply | Qty: 30 | Fill #0

## 2018-03-11 NOTE — Progress Notes (Signed)
Addendum to INR visit:   Mr. Hooton is therapeutic today with INR of 2.3. Of note, he was previously taking 5mg  of warfarin daily. His INR during his last PCP visit on 03/04/18 was 4.3.  Subsequently, his weekly dose was decreased by 7.5 mg. This was achieved by holding one 5mg  tablet and decreasing his weekly regmine (0.5 tablets on Monday with 1 tablet all other days). I changed his weekly instructions to reflect this.  Of note, patient was concerned for bright red blood in the stool that he noticed 03/08/18. This has since resolved, and he has not noticed any bleeding signs/symptoms since 03/08/18. I discussed this finding with Dr. Laural Benes. We both agreed that patient should continue warfarin at this time.   Additionally, he is concerned about the amount of medications he is taking. I performed a brief medication reconciliation. Patient has multiple PRN medications on file that can have CNS-depressant effects. Pt is taking these scheduled instead of PRN. He has complaints of occasional dizziness and drowsiness. I assured him that I will make PCP aware and this can be discussed at next office visit.   Butch Penny, PharmD  Clinical Pharmacist  [facility] [phone]

## 2018-03-12 ENCOUNTER — Encounter: Payer: Self-pay | Admitting: Physical Therapy

## 2018-03-12 ENCOUNTER — Ambulatory Visit: Payer: Medicaid Other | Admitting: Physical Therapy

## 2018-03-12 DIAGNOSIS — M6281 Muscle weakness (generalized): Secondary | ICD-10-CM

## 2018-03-12 DIAGNOSIS — G540 Brachial plexus disorders: Secondary | ICD-10-CM

## 2018-03-12 DIAGNOSIS — R278 Other lack of coordination: Secondary | ICD-10-CM

## 2018-03-12 DIAGNOSIS — M79602 Pain in left arm: Secondary | ICD-10-CM

## 2018-03-12 NOTE — Therapy (Signed)
[facility] [address] [place], [place], [address] Phone: [phone]   Fax:  [phone]  Physical Therapy Treatment  Patient Details  Name: Russell Carter MRN: [id number] Date of Birth: September 08, 1956 Referring Provider: Maeola Harman, MD   Encounter Date: 03/12/2018  PT End of Session - 03/12/18 1639    Visit Number  10    Number of Visits  20    Date for PT Re-Evaluation  04/11/18    PT Start Time  1501    PT Stop Time  1544    PT Time Calculation (min)  43 min    Activity Tolerance  Patient tolerated treatment well    Behavior During Therapy  [facility] for tasks assessed/performed       Past Medical History:  Diagnosis Date  . Anginal pain (HCC)   . Arthritis   . Blood in stool   . CAD (coronary artery disease)    a. 2011: cath showing mod non-obstructive disease b. 02/2016: cath with 95% stenosis in the Mid RCA. DES placed.  . Cervical stenosis of spine   . Cocaine abuse (HCC)    history  . Depression   . Diabetes mellitus without complication (HCC)   . Gout, unspecified   . Headache   . HTN (hypertension)   . Hypercholesterolemia   . Hyperglycemia   . Migraines   . Myocardial infarction [facility]) 2008   drug cocaine and marijuana at time of mi none  since    . Pneumonia   . Renal insufficiency   . Stroke [facility])     Past Surgical History:  Procedure Laterality Date  . ANTERIOR CERVICAL DECOMP/DISCECTOMY FUSION N/A 01/24/2017   Procedure: Cervical Four-Five,Cervical Five Six,Cervical Six-Seven Anterior cervical discectomy with fusion and plate with Cervical Three-Thoracic- One Laminectomy/Foraminotomy with Cervical Three-Thoracic- One Fixation and fusion;  Surgeon: Loura Halt Ditty, MD;  Location: [facility] OR;  Service: Neurosurgery;  Laterality: N/A;  Part-1 anterior approach  . CARDIAC CATHETERIZATION  2008; ~ 2011   [place]-40%lad  . CARDIAC CATHETERIZATION N/A 03/16/2016   Procedure: Left Heart Cath and Coronary  Angiography;  Surgeon: Marykay Lex, MD;  Location: [facility] INVASIVE CV LAB;  Service: Cardiovascular;  Laterality: N/A;  . CORONARY ANGIOPLASTY     STENTS  . EYE SURGERY Bilateral    "for double vision"  . INGUINAL HERNIA REPAIR  05/12/2012   Procedure: HERNIA REPAIR INGUINAL ADULT;  Surgeon: Liz Malady, MD;  Location: [facility];  Service: General;  Laterality: Right;  . INGUINAL HERNIA REPAIR Left 02/02/2003   Dr. Violeta Gelinas. repair with mesh. Same procedure done again in  05/22/2004   . INGUINAL HERNIA REPAIR Left ?date 2nd OR  . POSTERIOR CERVICAL FUSION/FORAMINOTOMY N/A 01/24/2017   Procedure: Cervical Three-Thoracic One-Laminectomy/Foraminotomy with Cervical Three-Thoracic One Fixation and fusion;  Surgeon: Loura Halt Ditty, MD;  Location: [facility] OR;  Service: Neurosurgery;  Laterality: N/A;  Part-2 Posterior approach  . RECONSTRUCT / STABILIZE DISTAL ULNA    . SHOULDER OPEN ROTATOR CUFF REPAIR Left 2010  . SHOULDER SURGERY Left    "injured on the job; had to cut small bone out"  . THUMB FUSION Right     There were no vitals filed for this visit.  Subjective Assessment - 03/12/18 1502    Subjective  "I am doing pretty good, I only really have soreness when I reach over head"     Currently in Pain?  Yes    Pain Score  4  [facility] PT Assessment - 03/12/18 0001      AROM   Left Shoulder Extension  50 Degrees    Left Shoulder Flexion  81 Degrees    Left Shoulder ABduction  68 Degrees                   [facility] Adult PT Treatment/Exercise - 03/12/18 1510      Shoulder Exercises: Supine   Protraction  20 reps;Strengthening;Both    Other Supine Exercises  scapulare retraction with shoulder ER 2 x 10 with yelll       Shoulder Exercises: Pulleys   Flexion  2 minutes    Scaption  2 minutes      Shoulder Exercises: ROM/Strengthening   UBE (Upper Arm Bike)  L1 x 5 min changed dir at 2:30 sec      Manual Therapy   Manual therapy comments   skilled palpation and monitoring throughout TPDn    Joint Mobilization  T3-T9 PA grade 4, Anterior grade 4 GHJ    Soft tissue mobilization  IASTM over the L upper / levator and rhomboids       Trigger Point Dry Needling - 03/12/18 1507    Consent Given?  Yes    Education Handout Provided  No given previously    Upper Trapezius Response  Twitch reponse elicited;Palpable increased muscle length    Levator Scapulae Response  Twitch response elicited;Palpable increased muscle length    Rhomboids Response  Twitch response elicited;Palpable increased muscle length             PT Short Term Goals - 02/03/18 1502      PT SHORT TERM GOAL #1   Title  Pt will be able to use scapular retraction through the day to provide support to postural alignment     Baseline  I have learned to sit up and relax which has been comfortable and decreases pain    Status  Achieved      PT SHORT TERM GOAL #2   Title  Pt will demo proper form with HEP for necessary challenge to joint ROM and muscular activation    Baseline  able to demo    Status  Achieved        PT Long Term Goals - 02/03/18 1504      PT LONG TERM GOAL #1   Title  Pt will be able to dress independently with minimal difficulty    Baseline  it is getting a little better but I still need some help- overhead dressing; now able to don own pants and belt    Time  14    Period  Weeks    Status  On-going    Target Date  04/11/18 to accomodate for MCD authorization periods      PT LONG TERM GOAL #2   Title  Average pain <=5/10 to decrease pain limitations on daily activities    Baseline  average 7-8/10    Time  14    Period  Weeks    Status  On-going    Target Date  04/11/18      PT LONG TERM GOAL #3   Title  Pt will be able to use Left arm to cook for himself for return to independence    Baseline  lacking strength to lift pots, pans, etc    Time  14    Period  Weeks    Status  New    Target Date  04/11/18  PT LONG TERM  GOAL #4   Title  Pt will be able to return to light sporting activities such as beginning karate and basketball to begin return to PLOF    Baseline  unable due to lack of strength and ROM    Time  14    Period  Weeks    Status  On-going    Target Date  04/11/18      PT LONG TERM GOAL #5   Title  Pt will be able to shave independently    Baseline  requires assistance    Time  14    Period  Weeks    Status  On-going    Target Date  04/11/18            Plan - 03/12/18 1640    Clinical Impression Statement  Russell Carter continues to make progress with PT improving shoulder mobility. continued TPDN focusing on upper trap, levator scap, Rhomboid followed with IASTM techniques. continued ROM and shoulder strengthening which he performed well and declined modalities end of session.     PT Treatment/Interventions  ADLs/Self Care Home Management;Cryotherapy;Electrical Stimulation;Ultrasound;Moist Heat;Iontophoresis 4mg /ml Dexamethasone;Therapeutic activities;Therapeutic exercise;Patient/family education;Manual techniques;Scar mobilization;Passive range of motion;Taping;Dry needling    PT Next Visit Plan  continue shoulder stretching and AROM, scapular challenges, door stretch, response to DN/continue DN as appropriate     PT Home Exercise Plan  PROM elbow & GHJ flexion, scap retraction, cervical rotation+scap retraction, supine iso flx hold at 90, seated ER yellow tband with pillow; sidelying ER, supine biceps curl, table towel slide, door stretch     Consulted and Agree with Plan of Care  Patient       Patient will benefit from skilled therapeutic intervention in order to improve the following deficits and impairments:  Pain, Improper body mechanics, Postural dysfunction, Increased muscle spasms, Decreased activity tolerance, Decreased range of motion, Decreased strength, Impaired UE functional use  Visit Diagnosis: 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 Left knee pain 01/10/2018  . Left arm weakness 08/07/2017  . Numbness and tingling in both hands 07/31/2017  . Gastroesophageal reflux disease without esophagitis 07/31/2017  . Chronic pain disorder 06/05/2017  . Gastrointestinal hemorrhage with melena   . Pulmonary embolism (HCC) 02/14/2017  . Diabetes mellitus type 2 in nonobese (HCC)   . Cocaine abuse (HCC)   . Depression   . Stage 3 chronic kidney disease (HCC)   . History of CVA (cerebrovascular accident)   . Prediabetes   . Cervical spondylosis with radiculopathy 01/24/2017  . Dyslipidemia 03/15/2016  . Complete heart block (HCC) 03/14/2016  . Cervical spinal stenosis 03/14/2016  . CAD-50% LAD 2011 12/14/2014  . Gout 11/16/2014  . Polysubstance abuse (HCC) 01/15/2013  . DIZZINESS 06/05/2010  . Essential hypertension 05/18/2009   Lulu Riding PT, DPT, LAT, ATC  03/12/18  4:44 PM      [facility] [address] [place], [place], [address] Phone: [phone]   Fax:  [phone]  Name: Russell Carter MRN: [id number] Date of Birth: 1956-05-31

## 2018-03-13 ENCOUNTER — Telehealth: Payer: Self-pay | Admitting: Internal Medicine

## 2018-03-13 DIAGNOSIS — K625 Hemorrhage of anus and rectum: Secondary | ICD-10-CM

## 2018-03-13 DIAGNOSIS — Z1211 Encounter for screening for malignant neoplasm of colon: Secondary | ICD-10-CM

## 2018-03-13 NOTE — Telephone Encounter (Signed)
Patient called returning PCP call. Please Follow Up

## 2018-03-13 NOTE — Telephone Encounter (Signed)
PC placed to pt this a.m.  I left message on VM staing who I am and that I was calling to f/u with him regarding concerns expressed to clinical pharmacist on visit 2 days ago.  I will try him again later.

## 2018-03-17 NOTE — Telephone Encounter (Signed)
Return PC placed to pt 03/14/2018 for f/u on his report to the clinical pharmacist that he had noted some bright red blood in the stool on 5 days ago.  Pt tells me that he had one episode of this; small amount of blood, no associated rectal or abdominal pain. States that he was having some jt pain and he accidentally took some Ibuprofen that day.  When ever he takes Ibuprofen, he notice blood in the stool. Plans not to take it again.  Advise to use Tylenol instead and report any further episodes.  Pt has not had colon CA screening in the past, he is agreeable to referral.

## 2018-03-18 ENCOUNTER — Encounter: Payer: Self-pay | Admitting: Pharmacist

## 2018-03-19 ENCOUNTER — Telehealth: Payer: Self-pay | Admitting: Pharmacist

## 2018-03-19 NOTE — Telephone Encounter (Signed)
PC placed 03/18/18 @ 330 PM. HIPAA-compliant VM left reinforcing need to see me with recent schedule change in warfarin dosing. Pt usually keeps appointments but does have history of mixing up times/dates for different appointments. Will follow-up as he is able.

## 2018-03-20 ENCOUNTER — Ambulatory Visit: Payer: Medicaid Other | Admitting: Physical Therapy

## 2018-03-20 ENCOUNTER — Encounter: Payer: Self-pay | Admitting: Physical Therapy

## 2018-03-20 DIAGNOSIS — M79602 Pain in left arm: Secondary | ICD-10-CM

## 2018-03-20 DIAGNOSIS — G540 Brachial plexus disorders: Secondary | ICD-10-CM | POA: Diagnosis not present

## 2018-03-20 DIAGNOSIS — M6281 Muscle weakness (generalized): Secondary | ICD-10-CM

## 2018-03-20 DIAGNOSIS — R278 Other lack of coordination: Secondary | ICD-10-CM

## 2018-03-20 NOTE — Therapy (Signed)
[facility] [address] [place], [place], [address] Phone: [phone]   Fax:  [phone]  Physical Therapy Treatment  Patient Details  Name: Russell Carter MRN: [id number] Date of Birth: 03-Oct-1956 Referring Provider: Maeola Harman, MD   Encounter Date: 03/20/2018  PT End of Session - 03/20/18 1506    Visit Number  11    Number of Visits  20    Date for PT Re-Evaluation  04/11/18    Authorization Type  MCD re Berkley Harvey submitted 4/15    PT Start Time  1414    PT Stop Time  1446 moist heat not included in billing     PT Time Calculation (min)  32 min    Activity Tolerance  Patient tolerated treatment well    Behavior During Therapy  [facility] for tasks assessed/performed       Past Medical History:  Diagnosis Date  . Anginal pain (HCC)   . Arthritis   . Blood in stool   . CAD (coronary artery disease)    a. 2011: cath showing mod non-obstructive disease b. 02/2016: cath with 95% stenosis in the Mid RCA. DES placed.  . Cervical stenosis of spine   . Cocaine abuse (HCC)    history  . Depression   . Diabetes mellitus without complication (HCC)   . Gout, unspecified   . Headache   . HTN (hypertension)   . Hypercholesterolemia   . Hyperglycemia   . Migraines   . Myocardial infarction [facility]) 2008   drug cocaine and marijuana at time of mi none  since    . Pneumonia   . Renal insufficiency   . Stroke [facility])     Past Surgical History:  Procedure Laterality Date  . ANTERIOR CERVICAL DECOMP/DISCECTOMY FUSION N/A 01/24/2017   Procedure: Cervical Four-Five,Cervical Five Six,Cervical Six-Seven Anterior cervical discectomy with fusion and plate with Cervical Three-Thoracic- One Laminectomy/Foraminotomy with Cervical Three-Thoracic- One Fixation and fusion;  Surgeon: Loura Halt Ditty, MD;  [facility] OR;  Service: Neurosurgery;  Laterality: N/A;  Part-1 anterior approach  . CARDIAC CATHETERIZATION  2008; ~ 2011   [place]-40%lad  .  CARDIAC CATHETERIZATION N/A 03/16/2016   Procedure: Left Heart Cath and Coronary Angiography;  Surgeon: Marykay Lex, MD;  Location: [facility] INVASIVE CV LAB;  Service: Cardiovascular;  Laterality: N/A;  . CORONARY ANGIOPLASTY     STENTS  . EYE SURGERY Bilateral    "for double vision"  . INGUINAL HERNIA REPAIR  05/12/2012   Procedure: HERNIA REPAIR INGUINAL ADULT;  Surgeon: Liz Malady, MD;  Location: [facility];  Service: General;  Laterality: Right;  . INGUINAL HERNIA REPAIR Left 02/02/2003   Dr. Violeta Gelinas. repair with mesh. Same procedure done again in  05/22/2004   . INGUINAL HERNIA REPAIR Left ?date 2nd OR  . POSTERIOR CERVICAL FUSION/FORAMINOTOMY N/A 01/24/2017   Procedure: Cervical Three-Thoracic One-Laminectomy/Foraminotomy with Cervical Three-Thoracic One Fixation and fusion;  Surgeon: Loura Halt Ditty, MD;  Location: [facility] OR;  Service: Neurosurgery;  Laterality: N/A;  Part-2 Posterior approach  . RECONSTRUCT / STABILIZE DISTAL ULNA    . SHOULDER OPEN ROTATOR CUFF REPAIR Left 2010  . SHOULDER SURGERY Left    "injured on the job; had to cut small bone out"  . THUMB FUSION Right     There were no vitals filed for this visit.  Subjective Assessment - 03/20/18 1415    Subjective  I felt good again after the needling, it hurts in teh cold especially  in [facility]. I do not have tingling in my L arm anymore, but its still in my right. The tingling in the R does go away after needling but then comes back.     Patient Stated Goals  decrease pain, play basketball/tennis, lift weights, armed securty, karate, dress/cook independently    Currently in Pain?  Yes    Pain Score  7     Pain Location  Shoulder    Pain Orientation  Left    Pain Descriptors / Indicators  Aching;Dull    Pain Type  Chronic pain    Pain Radiating Towards  down spine in the middle of my back     Pain Onset  More than a month ago    Pain Frequency  Constant    Aggravating Factors   cold air    Pain  Relieving Factors  DN, exercise, massage     Effect of Pain on Daily Activities  moderate-severe                        [facility] Adult PT Treatment/Exercise - 03/20/18 0001      Shoulder Exercises: Supine   Flexion  Strengthening;15 reps;Weights    Shoulder Flexion Weight (lbs)  3    Other Supine Exercises  supine shoulder stretching ER in supine x15       Shoulder Exercises: Prone   Retraction  10 reps;Other (comment) limited in part by anterior muscle stiffness of chest wall     Extension  Both;15 reps;Weights;Strengthening    Extension Weight (lbs)  0    Other Prone Exercises  prone B ER stretch in W position x4 minutes       Shoulder Exercises: Sidelying   External Rotation  Strengthening;Left;15 reps;Weights    External Rotation Weight (lbs)  0    ABduction  Strengthening;10 reps;Weights    ABduction Weight (lbs)  3      Moist Heat Therapy   Number Minutes Moist Heat  10 Minutes not included in billing     Moist Heat Location  Shoulder;Other (comment) L upper trap/rhomboids/levator       Manual Therapy   Manual Therapy  Soft tissue mobilization    Manual therapy comments  separate from all other skilled services     Soft tissue mobilization  IASTM over L levators/rhomboids              PT Education - 03/20/18 1505    Education provided  Yes    Education Details  exercise form thorughout session, POC, verbal HEP updates     Person(s) Educated  Patient    Methods  Explanation    Comprehension  Verbalized understanding       PT Short Term Goals - 02/03/18 1502      PT SHORT TERM GOAL #1   Title  Pt will be able to use scapular retraction through the day to provide support to postural alignment     Baseline  I have learned to sit up and relax which has been comfortable and decreases pain    Status  Achieved      PT SHORT TERM GOAL #2   Title  Pt will demo proper form with HEP for necessary challenge to joint ROM and muscular activation     Baseline  able to demo    Status  Achieved        PT Long Term Goals - 02/03/18 1504      PT  LONG TERM GOAL #1   Title  Pt will be able to dress independently with minimal difficulty    Baseline  it is getting a little better but I still need some help- overhead dressing; now able to don own pants and belt    Time  14    Period  Weeks    Status  On-going    Target Date  04/11/18 to accomodate for MCD authorization periods      PT LONG TERM GOAL #2   Title  Average pain <=5/10 to decrease pain limitations on daily activities    Baseline  average 7-8/10    Time  14    Period  Weeks    Status  On-going    Target Date  04/11/18      PT LONG TERM GOAL #3   Title  Pt will be able to use Left arm to cook for himself for return to independence    Baseline  lacking strength to lift pots, pans, etc    Time  14    Period  Weeks    Status  New    Target Date  04/11/18      PT LONG TERM GOAL #4   Title  Pt will be able to return to light sporting activities such as beginning karate and basketball to begin return to PLOF    Baseline  unable due to lack of strength and ROM    Time  14    Period  Weeks    Status  On-going    Target Date  04/11/18      PT LONG TERM GOAL #5   Title  Pt will be able to shave independently    Baseline  requires assistance    Time  14    Period  Weeks    Status  On-going    Target Date  04/11/18            Plan - 03/20/18 1508    Clinical Impression Statement  Patient arrives today reporting he is feeling better after second session of needling, however the cold air from [facility] has irritated his L shoulder to 7/10 pain. Started session with moist heat to reduce pain prior to exercise today (heat not included in billing), otherwise continued with shoulder stretching, AROM/strengthening as appropriate, and manual interventions as tolerated. AROM is improving however patient continues to benefit from gravity eliminated positions due to ongoing  compensation patterns in gravity present scenarios. Patient seems to be slowly improving and will continue to benefit from skilled PT services moving forward.     Rehab Potential  Fair    PT Frequency  2x / week    PT Duration  8 weeks    PT Treatment/Interventions  ADLs/Self Care Home Management;Cryotherapy;Electrical Stimulation;Ultrasound;Moist Heat;Iontophoresis 4mg /ml Dexamethasone;Therapeutic activities;Therapeutic exercise;Patient/family education;Manual techniques;Scar mobilization;Passive range of motion;Taping;Dry needling    PT Next Visit Plan  continue shoulder stretching and AROM,, strengthening, scapular challenges, door stretch, response to DN/continue DN as appropriate     PT Home Exercise Plan  PROM elbow & GHJ flexion, scap retraction, cervical rotation+scap retraction, supine iso flx hold at 90, seated ER yellow tband with pillow; sidelying ER, supine biceps curl, table towel slide, door stretch     Consulted and Agree with Plan of Care  Patient       Patient will benefit from skilled therapeutic intervention in order to improve the following deficits and impairments:  Pain, Improper body mechanics, Postural dysfunction, Increased muscle  spasms, Decreased activity tolerance, Decreased range of motion, Decreased strength, Impaired UE functional use  Visit Diagnosis: 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 Left knee pain 01/10/2018  . Left arm weakness 08/07/2017  . Numbness and tingling in both hands 07/31/2017  . Gastroesophageal reflux disease without esophagitis 07/31/2017  . Chronic pain disorder 06/05/2017  . Gastrointestinal hemorrhage with melena   . Pulmonary embolism (HCC) 02/14/2017  . Diabetes mellitus type 2 in nonobese (HCC)   . Cocaine abuse (HCC)   . Depression   . Stage 3 chronic kidney  disease (HCC)   . History of CVA (cerebrovascular accident)   . Prediabetes   . Cervical spondylosis with radiculopathy 01/24/2017  . Dyslipidemia 03/15/2016  . Complete heart block (HCC) 03/14/2016  . Cervical spinal stenosis 03/14/2016  . CAD-50% LAD 2011 12/14/2014  . Gout 11/16/2014  . Polysubstance abuse (HCC) 01/15/2013  . DIZZINESS 06/05/2010  . Essential hypertension 05/18/2009    Nedra Hai PT, DPT, CBIS  Supplemental Physical Therapist [facility]   Pager [phone]   [facility] [address] [place], [place], [address] Phone: [phone]   Fax:  [phone]  Name: Wright Gravely MRN: [id number] Date of Birth: [birth year]

## 2018-03-24 ENCOUNTER — Encounter: Payer: Self-pay | Admitting: Physical Therapy

## 2018-03-24 ENCOUNTER — Ambulatory Visit: Payer: Medicaid Other | Attending: Neurosurgery | Admitting: Physical Therapy

## 2018-03-24 ENCOUNTER — Ambulatory Visit: Payer: Medicaid Other | Attending: Internal Medicine | Admitting: Pharmacist

## 2018-03-24 DIAGNOSIS — M79601 Pain in right arm: Secondary | ICD-10-CM | POA: Diagnosis present

## 2018-03-24 DIAGNOSIS — M79602 Pain in left arm: Secondary | ICD-10-CM

## 2018-03-24 DIAGNOSIS — M6281 Muscle weakness (generalized): Secondary | ICD-10-CM

## 2018-03-24 DIAGNOSIS — R278 Other lack of coordination: Secondary | ICD-10-CM | POA: Insufficient documentation

## 2018-03-24 DIAGNOSIS — M4722 Other spondylosis with radiculopathy, cervical region: Secondary | ICD-10-CM | POA: Insufficient documentation

## 2018-03-24 DIAGNOSIS — I2699 Other pulmonary embolism without acute cor pulmonale: Secondary | ICD-10-CM | POA: Insufficient documentation

## 2018-03-24 DIAGNOSIS — R29818 Other symptoms and signs involving the nervous system: Secondary | ICD-10-CM | POA: Insufficient documentation

## 2018-03-24 DIAGNOSIS — I2782 Chronic pulmonary embolism: Secondary | ICD-10-CM

## 2018-03-24 DIAGNOSIS — G540 Brachial plexus disorders: Secondary | ICD-10-CM | POA: Insufficient documentation

## 2018-03-24 LAB — POCT INR: INR: 4.4 — AB (ref 2.0–3.0)

## 2018-03-25 NOTE — Therapy (Addendum)
[place], [place], [address] Phone: [phone]   Fax:  [phone]  Physical Therapy Treatment  Patient Details  Name: Russell Carter MRN: [id number] Date of Birth: 1956-05-24 Referring Provider: Erline Levine, MD   Encounter Date: 03/24/2018  PT End of Session - 03/24/18 1642    Visit Number  12 1 more visit after the 12+ eval     Number of Visits  20    Date for PT Re-Evaluation  04/11/18    Authorization Type  MCD re Josem Kaufmann submitted 4/15    PT Start Time  1500    PT Stop Time  1544    PT Time Calculation (min)  44 min    Activity Tolerance  Patient tolerated treatment well    Behavior During Therapy  [facility] for tasks assessed/performed       Past Medical History:  Diagnosis Date  . Anginal pain ([place])   . Arthritis   . Blood in stool   . CAD (coronary artery disease)    a. 2011: cath showing mod non-obstructive disease b. 02/2016: cath with 95% stenosis in the Mid RCA. DES placed.  . Cervical stenosis of spine   . Cocaine abuse ([place])    history  . Depression   . Diabetes mellitus without complication ([place])   . Gout, unspecified   . Headache   . HTN (hypertension)   . Hypercholesterolemia   . Hyperglycemia   . Migraines   . Myocardial infarction [facility]) 2008   drug cocaine and marijuana at time of mi none  since    . Pneumonia   . Renal insufficiency   . Stroke [facility])     Past Surgical History:  Procedure Laterality Date  . ANTERIOR CERVICAL DECOMP/DISCECTOMY FUSION N/A 01/24/2017   Procedure: Cervical Four-Five,Cervical Five Six,Cervical Six-Seven Anterior cervical discectomy with fusion and plate with Cervical Three-Thoracic- One Laminectomy/Foraminotomy with Cervical Three-Thoracic- One Fixation and fusion;  Surgeon: Kevan Ny Ditty, MD;  Location: [facility];  Service: Neurosurgery;  Laterality: N/A;  Part-1 anterior approach  . CARDIAC CATHETERIZATION  2008; ~ 2011   [place]-40%lad  .  CARDIAC CATHETERIZATION N/A 03/16/2016   Procedure: Left Heart Cath and Coronary Angiography;  Surgeon: Leonie Man, MD;  Location: [place] CV LAB;  Service: Cardiovascular;  Laterality: N/A;  . CORONARY ANGIOPLASTY     STENTS  . EYE SURGERY Bilateral    "for double vision"  . INGUINAL HERNIA REPAIR  05/12/2012   Procedure: HERNIA REPAIR INGUINAL ADULT;  Surgeon: Zenovia Jarred, MD;  Location: [place];  Service: General;  Laterality: Right;  . INGUINAL HERNIA REPAIR Left 02/02/2003   Dr. Georganna Skeans. repair with mesh. Same procedure done again in  05/22/2004   . INGUINAL HERNIA REPAIR Left ?date 2nd OR  . POSTERIOR CERVICAL FUSION/FORAMINOTOMY N/A 01/24/2017   Procedure: Cervical Three-Thoracic One-Laminectomy/Foraminotomy with Cervical Three-Thoracic One Fixation and fusion;  Surgeon: Kevan Ny Ditty, MD;  Location: [place];  Service: Neurosurgery;  Laterality: N/A;  Part-2 Posterior approach  . RECONSTRUCT / STABILIZE DISTAL ULNA    . SHOULDER OPEN ROTATOR CUFF REPAIR Left 2010  . SHOULDER SURGERY Left    "injured on the job; had to cut small bone out"  . THUMB FUSION Right     There were no vitals filed for this visit.  Subjective Assessment - 03/24/18 1504    Subjective  Patient feels like his arm is improving. He is trying to find  a weight to work with at home. He is having some pain today. His pain has improved but it is still there. His pain is a 3/10 at this time.     Currently in Pain?  Yes    Pain Score  3     Pain Location  Neck    Pain Orientation  Left    Pain Descriptors / Indicators  Aching    Pain Type  Chronic pain    Pain Onset  More than a month ago    Pain Frequency  Constant    Aggravating Factors   weed wacking; liftign the weed wacker     Pain Relieving Factors  DN, exercises and massage     Effect of Pain on Daily Activities  Pain when lifting     Multiple Pain Sites  No         [facility] PT Assessment - 03/25/18 0001      AROM    Overall AROM Comments  cervical rotation 45 degrees R 54 left     Left Shoulder Flexion  88 Degrees    Left Shoulder ABduction  74 Degrees    Left Shoulder External Rotation  -- Can reach mid occiput       Strength   Left Shoulder Flexion  3+/5    Left Shoulder ABduction  3+/5    Left Shoulder Internal Rotation  3+/5    Right Hand Grip (lbs)  70    Left Hand Grip (lbs)  65      Palpation   Palpation comment  tenderness to palpation in the upper trap and levator                   [facility] Adult PT Treatment/Exercise - 03/25/18 0001      Shoulder Exercises: Pulleys   Flexion  2 minutes    Scaption  2 minutes      Manual Therapy   Manual Therapy  Soft tissue mobilization    Manual therapy comments  skilled palaption of trigger ponts     Soft tissue mobilization  IASTM over L levators/rhomboids and bilateral upper traps, Bilateral trigger point release to upper traps       Trigger Point Dry Needling - 03/25/18 0952    Consent Given?  Yes    Education Handout Provided  No    Upper Trapezius Response  Twitch reponse elicited    Levator Scapulae Response  Twitch response elicited    Rhomboids Response  Twitch response elicited           PT Education - 03/24/18 1507    Education provided  Yes    Education Details  reviewed HEP and exercises     Person(s) Educated  Patient    Methods  Explanation;Demonstration;Tactile cues;Verbal cues    Comprehension  Verbalized understanding;Returned demonstration;Verbal cues required;Tactile cues required       PT Short Term Goals - 03/25/18 0950      PT SHORT TERM GOAL #1   Title  Pt will be able to use scapular retraction through the day to provide support to postural alignment     Baseline  I have learned to sit up and relax which has been comfortable and decreases pain    Time  4    Period  Weeks    Status  Achieved      PT SHORT TERM GOAL #2   Title  Pt will demo proper form with HEP for necessary challenge to  joint ROM and muscular activation    Baseline  able to demo perfroming exercises     Time  4    Period  Weeks    Status  Achieved      PT SHORT TERM GOAL #3   Title  Patient will increase active shoulder flexion to 120 degrees     Baseline  90 degrees     Time  4    Period  Weeks    Status  New    Target Date  04/22/18      PT SHORT TERM GOAL #4   Title  Patient will inrease left shoulder strength to 4+/5 in avalable ranges     Baseline  3+/5 in available ranges     Time  4    Period  Weeks    Status  New    Target Date  04/22/18        PT Long Term Goals - 03/24/18 1515      PT LONG TERM GOAL #1   Title  Pt will be able to dress independently with minimal difficulty    Baseline  He can dress himself but he has pain. He has the most difficulty with button up shirts. He is progressing well towards the goal    Time  4    Period  Weeks    Status  Not Met      PT LONG TERM GOAL #2   Title  Average pain <=5/10 to decrease pain limitations on daily activities    Baseline  Pain today isa 3/10     Time  4    Period  Weeks    Status Met      PT LONG TERM GOAL #3   Title  Pt will be able to use Left arm to cook for himself for return to independence    Baseline  still lacking enough strength to not drop the pots and pans     Time  14    Period  Weeks    Status  On-going      PT LONG TERM GOAL #4   Title  Pt will be able to return to light sporting activities such as beginning karate and basketball to begin return to PLOF    Baseline  No back to sports yet     Time  14    Period  Weeks    Status  On-going      PT LONG TERM GOAL #5   Title  Pt will be able to shave independently    Baseline  independlty shaving     Time  14    Period  Weeks    Status  Achieved            Plan - 03/25/18 0834    Clinical Impression Statement  Patient is making progress. his strength and motion have improved. He continues to have spamsing on the left and right but he feels like  it is getting better. He tolerated trigger point dry needling well today. After needling the right side he reported the numbness into his hand went away. He woiuld benefit from further skilled therapy to continue to progress his gains in strength and cervical motion. He is still limited in active movement over 90 degrees with his left shoulder. He is also limited in strength with the left shoulder strength,     Clinical Presentation  Stable    Clinical Decision Making  Low  PT Frequency  2x / week    PT Duration  4 weeks    PT Treatment/Interventions  ADLs/Self Care Home Management;Cryotherapy;Electrical Stimulation;Ultrasound;Moist Heat;Iontophoresis '4mg'$ /ml Dexamethasone;Therapeutic activities;Therapeutic exercise;Patient/family education;Manual techniques;Scar mobilization;Passive range of motion;Taping;Dry needling    PT Next Visit Plan  continue shoulder stretching and AROM,, strengthening, scapular challenges, door stretch, response to DN/continue DN as appropriate     PT Home Exercise Plan  PROM elbow & GHJ flexion, scap retraction, cervical rotation+scap retraction, supine iso flx hold at 90, seated ER yellow tband with pillow; sidelying ER, supine biceps curl, table towel slide, door stretch     Consulted and Agree with Plan of Care  Patient       Patient will benefit from skilled therapeutic intervention in order to improve the following deficits and impairments:     Visit Diagnosis: 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 Left knee pain 01/10/2018  . Left arm weakness 08/07/2017  . Numbness and tingling in both hands 07/31/2017  . Gastroesophageal reflux disease without esophagitis 07/31/2017  .  Chronic pain disorder 06/05/2017  . Gastrointestinal hemorrhage with melena   . Pulmonary embolism ([place]) 02/14/2017  . Diabetes mellitus type 2 in nonobese (HCC)   . Cocaine abuse ([place])   . Depression   . Stage 3 chronic kidney disease ([place])   . History of CVA (cerebrovascular accident)   . Prediabetes   . Cervical spondylosis with radiculopathy 01/24/2017  . Dyslipidemia 03/15/2016  . Complete heart block ([place]) 03/14/2016  . Cervical spinal stenosis 03/14/2016  . CAD-50% LAD 2011 12/14/2014  . Gout 11/16/2014  . Polysubstance abuse ([place]) 01/15/2013  . DIZZINESS 06/05/2010  . Essential hypertension 05/18/2009    Carney Living PT DPT  03/25/2018, 10:57 AM  [facility] [address] [place], [place], [address] Phone: [phone]   Fax:  [phone]  Name: Prakash Kimberling MRN: [id number] Date of Birth: [birth year]

## 2018-03-26 ENCOUNTER — Encounter: Payer: Self-pay | Admitting: Physical Therapy

## 2018-03-26 ENCOUNTER — Ambulatory Visit: Payer: Medicaid Other | Admitting: Physical Therapy

## 2018-03-26 DIAGNOSIS — G540 Brachial plexus disorders: Secondary | ICD-10-CM

## 2018-03-26 DIAGNOSIS — M6281 Muscle weakness (generalized): Secondary | ICD-10-CM

## 2018-03-26 DIAGNOSIS — M79602 Pain in left arm: Secondary | ICD-10-CM

## 2018-03-26 DIAGNOSIS — R278 Other lack of coordination: Secondary | ICD-10-CM

## 2018-03-26 NOTE — Therapy (Addendum)
[place], [place], [address] Phone: [phone]   Fax:  [phone]  Physical Therapy Treatment/Discharge Summary  Patient Details  Name: Russell Carter MRN: [id number] Date of Birth: [birth year] Referring Provider: Erline Levine, MD   Encounter Date: 03/26/2018  PT End of Session - 03/26/18 1716    Visit Number  13    Number of Visits  20    Date for PT Re-Evaluation  04/11/18    Authorization Type  MCD re Josem Kaufmann submitted 4/15    PT Start Time  1640 moist heat not included in billing     PT Stop Time  1712    PT Time Calculation (min)  32 min    Activity Tolerance  Patient tolerated treatment well    Behavior During Therapy  [facility] for tasks assessed/performed       Past Medical History:  Diagnosis Date  . Anginal pain ([place])   . Arthritis   . Blood in stool   . CAD (coronary artery disease)    a. 2011: cath showing mod non-obstructive disease b. 02/2016: cath with 95% stenosis in the Mid RCA. DES placed.  . Cervical stenosis of spine   . Cocaine abuse ([place])    history  . Depression   . Diabetes mellitus without complication ([place])   . Gout, unspecified   . Headache   . HTN (hypertension)   . Hypercholesterolemia   . Hyperglycemia   . Migraines   . Myocardial infarction [facility]) 2008   drug cocaine and marijuana at time of mi none  since    . Pneumonia   . Renal insufficiency   . Stroke [facility])     Past Surgical History:  Procedure Laterality Date  . ANTERIOR CERVICAL DECOMP/DISCECTOMY FUSION N/A 01/24/2017   Procedure: Cervical Four-Five,Cervical Five Six,Cervical Six-Seven Anterior cervical discectomy with fusion and plate with Cervical Three-Thoracic- One Laminectomy/Foraminotomy with Cervical Three-Thoracic- One Fixation and fusion;  Surgeon: Kevan Ny Ditty, MD;  Location: [place];  Service: Neurosurgery;  Laterality: N/A;  Part-1 anterior approach  . CARDIAC CATHETERIZATION  2008; ~ 2011    [place]-40%lad  . CARDIAC CATHETERIZATION N/A 03/16/2016   Procedure: Left Heart Cath and Coronary Angiography;  Surgeon: Leonie Man, MD;  Location: [place] CV LAB;  Service: Cardiovascular;  Laterality: N/A;  . CORONARY ANGIOPLASTY     STENTS  . EYE SURGERY Bilateral    "for double vision"  . INGUINAL HERNIA REPAIR  05/12/2012   Procedure: HERNIA REPAIR INGUINAL ADULT;  Surgeon: Zenovia Jarred, MD;  Location: [place];  Service: General;  Laterality: Right;  . INGUINAL HERNIA REPAIR Left 02/02/2003   Dr. Georganna Skeans. repair with mesh. Same procedure done again in  05/22/2004   . INGUINAL HERNIA REPAIR Left ?date 2nd OR  . POSTERIOR CERVICAL FUSION/FORAMINOTOMY N/A 01/24/2017   Procedure: Cervical Three-Thoracic One-Laminectomy/Foraminotomy with Cervical Three-Thoracic One Fixation and fusion;  Surgeon: Kevan Ny Ditty, MD;  Location: [place];  Service: Neurosurgery;  Laterality: N/A;  Part-2 Posterior approach  . RECONSTRUCT / STABILIZE DISTAL ULNA    . SHOULDER OPEN ROTATOR CUFF REPAIR Left 2010  . SHOULDER SURGERY Left    "injured on the job; had to cut small bone out"  . THUMB FUSION Right     There were no vitals filed for this visit.  Subjective Assessment - 03/26/18 1633    Subjective  I felt pretty good after last needling session, the numbness went away  in my right arm since he did both sides but it came back this morning. Even my right arm is moving better, my left is still limited, its more tight in the shoulder itself. My shoulder hurts more now because its raining.      Patient Stated Goals  decrease pain, play basketball/tennis, lift weights, armed securty, karate, dress/cook independently    Currently in Pain?  Yes    Pain Score  8     Pain Location  Shoulder    Pain Orientation  Left    Pain Descriptors / Indicators  Stabbing;Sharp    Pain Type  Chronic pain                       [facility] Adult PT Treatment/Exercise - 03/26/18  0001      Shoulder Exercises: Supine   Flexion  Strengthening;Left;10 reps;Weights    Shoulder Flexion Weight (lbs)  4      Shoulder Exercises: Prone   Retraction  10 reps    Extension  Both;15 reps    Extension Weight (lbs)  0    Other Prone Exercises  prone B ER stretch in W position x4 minutes     Other Prone Exercises  prone rows 1x10 mod facilitation for form       Shoulder Exercises: Sidelying   External Rotation  Left;15 reps;Weights min facilitation     External Rotation Weight (lbs)  2    ABduction  Strengthening;10 reps;Left;Weights    ABduction Weight (lbs)  4      Shoulder Exercises: Standing   Other Standing Exercises  wall walks 1x10 iwth 5 second holds to 140 degrees       Moist Heat Therapy   Number Minutes Moist Heat  10 Minutes not included in billing     Moist Heat Location  Shoulder      Manual Therapy   Manual Therapy  Soft tissue mobilization    Manual therapy comments  separate from all other skilled services      Soft tissue mobilization  IASTM over L levators/rhomboids and bilateral upper traps, Bilateral trigger point release to upper traps           PT Education - 03/26/18 1716    Education provided  Yes    Education Details  reviewed POC moving forward     Person(s) Educated  Patient    Methods  Explanation    Comprehension  Verbalized understanding       PT Short Term Goals - 03/25/18 0950      PT SHORT TERM GOAL #1   Title  Pt will be able to use scapular retraction through the day to provide support to postural alignment     Baseline  I have learned to sit up and relax which has been comfortable and decreases pain    Time  4    Period  Weeks    Status  Achieved      PT SHORT TERM GOAL #2   Title  Pt will demo proper form with HEP for necessary challenge to joint ROM and muscular activation    Baseline  able to demo perfroming exercises     Time  4    Period  Weeks    Status  Achieved      PT SHORT TERM GOAL #3   Title   Patient will increase active shoulder flexion to 120 degrees     Baseline  90 degrees  Time  4    Period  Weeks    Status  New    Target Date  04/22/18      PT SHORT TERM GOAL #4   Title  Patient will inrease left shoulder strength to 4+/5 in avalable ranges     Baseline  3+/5 in available ranges     Time  4    Period  Weeks    Status  New    Target Date  04/22/18        PT Long Term Goals - 03/24/18 1515      PT LONG TERM GOAL #1   Title  Pt will be able to dress independently with minimal difficulty    Baseline  He can dress himself but he has pain. He has the most difficulty with button up shirts. He is progressing well towards the goal    Time  14    Period  Weeks    Status  Not Met      PT LONG TERM GOAL #2   Title  Average pain <=5/10 to decrease pain limitations on daily activities    Baseline  Pain today isa 3/10     Time  14    Period  Weeks    Status  Not Met      PT LONG TERM GOAL #3   Title  Pt will be able to use Left arm to cook for himself for return to independence    Baseline  still lacking enough strength to not drop the pots and pans     Time  14    Period  Weeks    Status  On-going      PT LONG TERM GOAL #4   Title  Pt will be able to return to light sporting activities such as beginning karate and basketball to begin return to PLOF    Baseline  No back to sports yet     Time  14    Period  Weeks    Status  On-going      PT LONG TERM GOAL #5   Title  Pt will be able to shave independently    Baseline  independlty shaving     Time  14    Period  Weeks    Status  Achieved            Plan - 03/26/18 1717    Clinical Impression Statement  Patient arrives today with increased pain/stiffness due to rainy weather today and requesting moist heat for pain relief; moist heat applied and not included in billing. Otherwise continued with functional shoulder strengthening and mobility/flexibility exercises today. Finished with ongoing STM to L  shoulder musculature. ROM continues to improve and patient continues to slowly progress with skilled PT services.     Rehab Potential  Fair    PT Frequency  2x / week    PT Duration  4 weeks    PT Treatment/Interventions  ADLs/Self Care Home Management;Cryotherapy;Electrical Stimulation;Ultrasound;Moist Heat;Iontophoresis 65m/ml Dexamethasone;Therapeutic activities;Therapeutic exercise;Patient/family education;Manual techniques;Scar mobilization;Passive range of motion;Taping;Dry needling    PT Next Visit Plan  increase focus on wall walks and pulleys. continue shoulder stretching and AROM,, strengthening, scapular challenges, door stretch, response to DN/continue DN as appropriate     PT Home Exercise Plan  PROM elbow & GHJ flexion, scap retraction, cervical rotation+scap retraction, supine iso flx hold at 90, seated ER yellow tband with pillow; sidelying ER, supine biceps curl, table towel slide, door stretch  Consulted and Agree with Plan of Care  Patient       Patient will benefit from skilled therapeutic intervention in order to improve the following deficits and impairments:  Pain, Improper body mechanics, Postural dysfunction, Increased muscle spasms, Decreased activity tolerance, Decreased range of motion, Decreased strength, Impaired UE functional use  Visit Diagnosis: 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 Left knee pain 01/10/2018  . Left arm weakness 08/07/2017  . Numbness and tingling in both hands 07/31/2017  . Gastroesophageal reflux disease without esophagitis 07/31/2017  . Chronic pain disorder 06/05/2017  . Gastrointestinal hemorrhage with melena   . Pulmonary embolism ([place]) 02/14/2017  . Diabetes mellitus type 2 in nonobese ([facility])   . Cocaine abuse ([place])   . Depression   . Stage 3 chronic kidney disease  ([place])   . History of CVA (cerebrovascular accident)   . Prediabetes   . Cervical spondylosis with radiculopathy 01/24/2017  . Dyslipidemia 03/15/2016  . Complete heart block ([place]) 03/14/2016  . Cervical spinal stenosis 03/14/2016  . CAD-50% LAD 2011 12/14/2014  . Gout 11/16/2014  . Polysubstance abuse ([place]) 01/15/2013  . DIZZINESS 06/05/2010  . Essential hypertension 05/18/2009    Deniece Ree PT, DPT, [place]  Supplemental Physical Therapist [place]   Pager [facility] [address] [place], [place], [address] Phone: [phone]   Fax:  [phone]  Name: Chandlor Noecker MRN: [id number] Date of Birth: August 17, 1956  PHYSICAL THERAPY DISCHARGE SUMMARY  Visits from Start of Care: 13  Current functional level related to goals / functional outcomes: See above   Remaining deficits: See above   Education / Equipment: Anatomy of condition, POC, HEP, exercise form/rationale  Plan: Patient agrees to discharge.  Patient goals were not met. Patient is being discharged due to not returning since the last visit.  ?????     Jessica C. Hightower PT, DPT 06/06/18 10:09 AM

## 2018-03-31 ENCOUNTER — Encounter: Payer: Self-pay | Admitting: Physical Therapy

## 2018-03-31 MED FILL — WARFARIN SODIUM 5 MG TABLET: 5 | 30 days supply | Qty: 30 | Fill #2

## 2018-03-31 MED FILL — OMEPRAZOLE 20 MG CAP: 20 | 30 days supply | Qty: 30 | Fill #1

## 2018-03-31 MED FILL — GABAPENTIN 400 MG CAPSULE: 400 | 30 days supply | Qty: 60 | Fill #4

## 2018-03-31 MED FILL — DULoxetine HCL 30 MG CPEP: 30 | 30 days supply | Qty: 60 | Fill #3

## 2018-04-02 ENCOUNTER — Ambulatory Visit: Payer: Medicaid Other | Admitting: Physical Therapy

## 2018-04-02 ENCOUNTER — Encounter: Payer: Self-pay | Admitting: Pharmacist

## 2018-04-03 ENCOUNTER — Telehealth: Payer: Self-pay | Admitting: Internal Medicine

## 2018-04-03 NOTE — Telephone Encounter (Signed)
Patient called requesting to speak with [facility]. Patient states that is is having a tooth pulled and has questions his he take coumadin medication. Please f/u

## 2018-04-04 ENCOUNTER — Telehealth: Payer: Self-pay | Admitting: Pharmacist

## 2018-04-04 ENCOUNTER — Telehealth: Payer: Self-pay | Admitting: *Deleted

## 2018-04-04 ENCOUNTER — Other Ambulatory Visit: Payer: Self-pay | Admitting: Gastroenterology

## 2018-04-04 NOTE — Telephone Encounter (Addendum)
[facility] has been managing patient's Coumadin. He takes Coumadin for history of PE/DVT and stroke and has been bridged with Lovenox for previous procedures due to elevated risk.  I will route to managing PharmD at [facility] so that they can arrange Lovenox bridging for pt prior to colonoscopy on 6/27.

## 2018-04-04 NOTE — Telephone Encounter (Signed)
PC placed and completed to Mr. Bouvier 04/04/18 @ 1:30 PM.   This note is regarding Coumadin and concern for upcoming tooth extraction on 04/09/18. Pt was recently seen by me (03/24/18) in the Coumadin clinic here at [facility]. INR supra at 4.4 during that visit. Pt was instructed to hold warfarin x1 dose and his weekly regimen was decreased. Pt had no s/sx of bleeding at that time. He was to follow-up with me but missed his appointment this past Wednesday (04/02/18).   With his upcoming extraction appt, would like to confirm INR decreased appropriately with changes made on 6/3. I reinforced this to pt and after discussion, he is amenable to come see me Monday (6/17). I have scheduled him for 1:30 Monday, and I have instructed him to bring in information for the dentist he'll be seeing to coordinate any needed therapy recommendations.   Butch Penny, PharmD, CPP Clinical Pharmacist  [facility] and [facility] [phone]

## 2018-04-04 NOTE — Telephone Encounter (Signed)
Spoke with Magazine features editor (pharmacist at [facility]). Pt is having colonoscopy 04/17/18. Will speak with Dr. Laural Benes (pt's PCP) concerning the matter. Pt needs to be off of warfarin x5 days prior to procedure and d/t high thrombotic risk will need Lovenox bridging at 1 mg/kg BID. Will forward information to patient's PCP and discuss with her.

## 2018-04-04 NOTE — Telephone Encounter (Signed)
I will send message to [facility] at [facility] for clarification on need to bridge with Lovenox as I do not see hx of a.fib on chart.Marland Kitchen

## 2018-04-04 NOTE — Telephone Encounter (Signed)
   [facility] Pre-operative Risk Assessment    Request for surgical clearance:  1. What type of surgery is being performed? Colonoscopy   2. When is this surgery scheduled? 04/17/2018    3. What type of clearance is required (medical clearance vs. Pharmacy clearance to hold med vs. Both)? Pharmacy  4. Are there any medications that need to be held prior to surgery and how long? Coumadin / possible Lovenox Bridge per Dr. Penelope Coop   5. Practice name and name of physician performing surgery? [facility] / Dr. Penelope Coop   6. What is your office phone [phone]    7.   What is your office fax [phone]  8.   Anesthesia type (None, local, MAC, general) ? N/A only asking for Pharmacy Clearance   Russell Carter 04/04/2018, 9:08 AM  _________________________________________________________________   (provider comments below)

## 2018-04-04 NOTE — Telephone Encounter (Signed)
Spoke with Magazine features editor (pharmacist at [facility]). Pt is having colonscopt   Patient is Therapeutic based on patient's INR of 2.5 and patient's INR goal of 2-3. Patient needs to be off of warfarin x 5 days prior to procedure and due to high thrombotic risk, will need Lovenox bridge. Discussed with Dr. Hyman Hopes. Will Stop current warfarin dose 11/30/17 and on that day start Lovenox 1mg /kg BID.

## 2018-04-04 NOTE — Telephone Encounter (Signed)
   Primary Cardiologist: Charlton Haws, MD  Need recommendations from Pharmacy regarding holding Coumadin +/- Lovenox bridging.    Chart reviewed as part of pre-operative protocol coverage.  62 y.o. male with a hx of CAD s/p DES to RCA in 5/17, prior pulmonary embolism, prior stroke, hypertension, hyperlipidemia.  He needs a colonoscopy.  RCRI:  6.6%.  Patient contacted today by phone.  He denies chest pain or shortness of breath since last visit in 2/19.  DASI:  5.29 METs.  Therefore, he is ok to proceed with surgery at acceptable risk.  He has required Lovenox in the past.  His Coumadin is managed by his PCP.  However, I will forward to our CVRR clinic to make recommendations on holding Coumadin +/- Lovenox bridging.  Tereso Newcomer, PA-C 04/04/2018, 9:52 AM

## 2018-04-04 NOTE — Telephone Encounter (Signed)
Per protocol, this final decision would have to come from patient's PCP. Per guidelines, a simple tooth extraction is not a contraindication to warfarin use. Exceptions of course for high risk bleed patients and multiple teeth extractions at the same visit. Will route information to Dr. Laural Benes.

## 2018-04-04 NOTE — Telephone Encounter (Signed)
   Primary Cardiologist: Charlton Haws, MD  Pre-op Callback Staff: 1. Please make sure Dr. Luan Moore office received faxed letter. 2. Please make sure patient knows he needs to see his PCP to arrange Lovenox while off of Coumadin.  The patient is ok to proceed with surgery at acceptable risk.  He will need Lovenox bridging.  This will need to be arranged by his PCP who manages his Coumadin.  Note will be removed from Pre Op Pool.  Tereso Newcomer, PA-C 04/04/2018, 10:22 AM

## 2018-04-07 ENCOUNTER — Other Ambulatory Visit: Payer: Self-pay | Admitting: Family Medicine

## 2018-04-07 ENCOUNTER — Encounter: Payer: Self-pay | Admitting: Pharmacist

## 2018-04-07 ENCOUNTER — Ambulatory Visit: Payer: Medicaid Other | Attending: Internal Medicine | Admitting: Pharmacist

## 2018-04-07 DIAGNOSIS — I2782 Chronic pulmonary embolism: Secondary | ICD-10-CM | POA: Diagnosis not present

## 2018-04-07 DIAGNOSIS — Z7901 Long term (current) use of anticoagulants: Secondary | ICD-10-CM | POA: Diagnosis not present

## 2018-04-07 DIAGNOSIS — K921 Melena: Secondary | ICD-10-CM | POA: Diagnosis present

## 2018-04-07 LAB — POCT INR: INR: 2.2 (ref 2.0–3.0)

## 2018-04-07 MED FILL — LOSARTAN POTASSIUM 100 MG T: 100 | 30 days supply | Qty: 30 | Fill #0

## 2018-04-07 MED FILL — ROSUVASTATIN CALCIUM 40 MG: 40 | 30 days supply | Qty: 30 | Fill #3

## 2018-04-07 NOTE — Telephone Encounter (Signed)
PC placed to pt 04/04/2018.  I left detail message on VM asking him to call us back with additional info.  I need to know how many teeth with be extracted.  If only one, then we do not have to stop coumadin provide his INR is not above his therapeutic range.  However if dentist is not comfortable with this, then we will have to hold coumadin for 2-3 days. I request that he speak with the dental office to inform them of this and to call us back with the name and phone number of the dental practice.

## 2018-04-07 NOTE — Patient Instructions (Addendum)
Thank you for coming to see me today. Please do the following:   1. Continue taking your warfarin as you have been. 1 tablet on Mondays, Wednesdays, and Fridays with 1/2 tablet all other days.   2. Do not hold or change your warfarin schedule unless Dr. Laural Benes, the dentist, or I tell you differently.   3. We will be in touch concerning what to do for your colonoscopy.

## 2018-04-07 NOTE — Telephone Encounter (Signed)
S/w Megan Supple, Pharm-D, stated this has been taken care of through [facility].  Pt needs a Lovenox bridge.

## 2018-04-07 NOTE — Progress Notes (Signed)
    Pharmacy Anticoagulation Clinic  Subjective: Patient presents today for INR monitoring prior to dental appointment on 04/09/18 for tooth extraction. 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  Current dose of warfarin: 5mg  MWF, 2.5mg  all other days; total weekly dose 25mg     Adherence to warfarin: yes Signs/symptoms of bleeding: yes - reports bright red blood in stool Sat/Sun (6/15-6/16) Recent changes in diet: no  Recent changes in medications: no Upcoming procedures that may impact anticoagulation: yes - pt has dental appointment Wednesday (04/09/18) and upcomming colonoscopy (04/17/18).   Objective: Today's INR = 2.2  Lab Results  Component Value Date   INR 4.4 (A) 03/24/2018   INR 2.3 03/11/2018   INR 4.3 (H) 03/04/2018     Assessment and Plan: Anticoagulation/bleeding: Patient is Therapeutic based on INR of 2.2 and INR goal of 2-3. Will continue current warfarin dose: - 1 tablet Monday, Wednesday, and Friday - 1/2 tablet all other days  Pt presents endorsing recent history of bright red blood in stool over this past weekend. Of note, no blood noted by patient today. Other signs/symptoms of bleeding negative. Have discussed this with patient's PCP (Dr. Laural Benes). She will have patient try to schedule an appointment with her but will not see patient at this time.  - CBC    Upcoming procedures:  Mr. Wolin has an appointment with [facility] 04/09/18 @ 10:00 am. Reached out to office today with Jay'a and Dr. Laural Benes. They claim that Mr. Monty has a "new patient" appointment for an exam/x-ray but an extraction is not currently planned. It is unclear if he will actually have a tooth extracted at that appointment. Regardless, encouraged patient to NOT hold warfarin given therapeutic INR today unless otherwise directed by myself, Dr. Laural Benes, or the dentist performing the extraction.   He also has a colonoscopy planned 04/17/18. After discussion with Dr. Laural Benes, she  will address bridging recommendation after follow-up with patient's cardiology office. I have instructed patient to not do anything concerning holding warfarin/initiating Lovenox until further directed by [place].   Patient verbalized understanding and was provided with written instructions. Next INR check will be coordinated before patient's colonoscopy on 04/17/18.  Butch Penny, PharmD, CPP Clinical Pharmacist [facility] and [facility]ness [phone]

## 2018-04-08 LAB — CBC WITH DIFFERENTIAL/PLATELET
BASOS ABS: 0 10*3/uL (ref 0.0–0.2)
Basos: 0 %
EOS (ABSOLUTE): 0.1 10*3/uL (ref 0.0–0.4)
Eos: 1 %
HEMATOCRIT: 43.5 % (ref 37.5–51.0)
Hemoglobin: 14.2 g/dL (ref 13.0–17.7)
IMMATURE GRANULOCYTES: 0 %
Immature Grans (Abs): 0 10*3/uL (ref 0.0–0.1)
LYMPHS ABS: 2.1 10*3/uL (ref 0.7–3.1)
Lymphs: 38 %
MCH: 29.7 pg (ref 26.6–33.0)
MCHC: 32.6 g/dL (ref 31.5–35.7)
MCV: 91 fL (ref 79–97)
MONOS ABS: 0.7 10*3/uL (ref 0.1–0.9)
Monocytes: 13 %
NEUTROS PCT: 48 %
Neutrophils Absolute: 2.6 10*3/uL (ref 1.4–7.0)
PLATELETS: 215 10*3/uL (ref 150–450)
RBC: 4.78 x10E6/uL (ref 4.14–5.80)
RDW: 15.5 % — AB (ref 12.3–15.4)
WBC: 5.5 10*3/uL (ref 3.4–10.8)

## 2018-04-09 ENCOUNTER — Telehealth: Payer: Self-pay

## 2018-04-09 NOTE — Telephone Encounter (Signed)
Contacted pt to go over lab results pt didn't answer left a detailed vm informing pt of results and if he has any questions or concerns to give me a call   If pt calls back please give results: blood count is normal. No anemia. Keep appointment for colonoscopy.

## 2018-04-09 NOTE — Progress Notes (Signed)
    Pharmacy Anticoagulation Clinic  Subjective: Patient presents today for INR monitoring. 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 Patient is also here to discuss bridging instructions for colonoscopy procedure coming up on 04/17/18.   Current dose of warfarin:  -5 mg MWF -2.5 mg all other days -total weekly = 25 mg   Adherence to warfarin: yes Signs/symptoms of bleeding: no Recent changes in diet: no Recent changes in medications: no Upcoming procedures that may impact anticoagulation: Colonoscopy 04/17/18   Objective: Today's INR = 1.6  Lab Results  Component Value Date   INR 2.2 04/07/2018   INR 4.4 (A) 03/24/2018   INR 2.3 03/11/2018     Assessment and Plan: Anticoagulation: Patient is Subtherapeutic based on INR of 1.6 and INR goal of 2-3.  -Will Increase current warfarin dose by having patient take an extra 0.5 tablet today.  -He will take his usual 1 tablet tomorrow. See below for bridging assessment  Bridging:  Per patient's cardiologist, patient needs to be off of warfarin x 5 days prior to procedure and due to high thrombotic risk, will need Lovenox bridge. Discussed with Dr. Laural Benes. Will have patient bridged as listed below. Patient is comfortable with Lovenox injections as he has had them before. He states having enough injections at home.  - continue warfarin today and tomorrow - begin holding warfarin 04/12/18  - Initiate Lovenox BID 1 mg/kg 04/14/18 and continue through AM dose 04/16/18. Hold Lovenox PM 04/16/18 - No anticoagulation day of procedure (04/17/18) - Resume anticoagulation once deemed stable by proceduralist performing colonoscopy.   Patient is agreeable to the plan. He has verbalized understanding and has no further questions at this time. He knows to contact me with any questions or concerns. I will follow-up with him on 04/22/18.  Of note, I have indicated on "anticoag track" of this encounter that patient should "hold warfarin" after the procedure  until seeing me. This is only because the proceduralist performing the colonoscopy will decide when/if pt is stable enough to resume anticoagulation. Patient knows he will require bridge with Lovenox after the procedure until his inr is within range. He knows to restart once given the okay.   Butch Penny, PharmD, CPP Clinical Pharmacist [facility] [phone]

## 2018-04-10 ENCOUNTER — Ambulatory Visit: Payer: Medicaid Other | Attending: Family Medicine | Admitting: Pharmacist

## 2018-04-10 DIAGNOSIS — I2782 Chronic pulmonary embolism: Secondary | ICD-10-CM | POA: Insufficient documentation

## 2018-04-10 DIAGNOSIS — Z7901 Long term (current) use of anticoagulants: Secondary | ICD-10-CM | POA: Diagnosis not present

## 2018-04-10 LAB — POCT INR: INR: 1.6 — AB (ref 2.0–3.0)

## 2018-04-10 NOTE — Patient Instructions (Addendum)
Please do the following:   1. Take an extra 0.5 tablets today.   2. Take 1 tablet on Friday.   3. Stop warfarin on Saturday.   4. Take nothing on Sunday.   5. Start Lovenox injections on the 24th (Monday). Inject once in the morning and once in the evening.   6. 6/25 (Tuesday): Inject Lovenox once in the morning and once in the evening.   7. 6/26 (Wednesday): Inject Lovenox once in the morning. DO NOT use in the evening.   8. Colonoscopy procedure.   9. Resume both warfarin and BID Lovenox once the colonoscopy doctor tells you it's okay.

## 2018-04-15 DIAGNOSIS — H903 Sensorineural hearing loss, bilateral: Secondary | ICD-10-CM | POA: Insufficient documentation

## 2018-04-16 ENCOUNTER — Encounter (HOSPITAL_COMMUNITY): Payer: Self-pay | Admitting: *Deleted

## 2018-04-16 ENCOUNTER — Other Ambulatory Visit: Payer: Self-pay

## 2018-04-16 IMAGING — CT CT HEAD W/O CM
4 series · 16 of 47 positions shown, 18 images · non-contrast
Comparison: MRI 05/15/2017, CT brain 05/15/2017

CLINICAL DATA: Headache for 2 weeks

EXAM:
CT HEAD WITHOUT CONTRAST
TECHNIQUE: Contiguous axial images were obtained from the base of the skull
through the vertex without intravenous contrast.

[Series 3: head wo · axial · 0.41mm/px · z∈[-112,+12]mm · 7 of 35 slices shown, 9 images]
[im 5/35  brain]
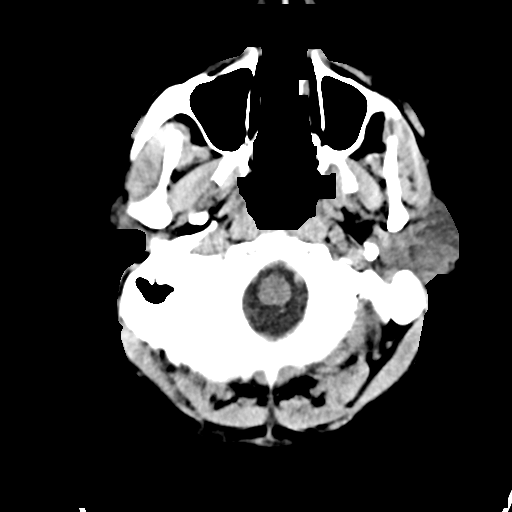
[im 5/35  bone]
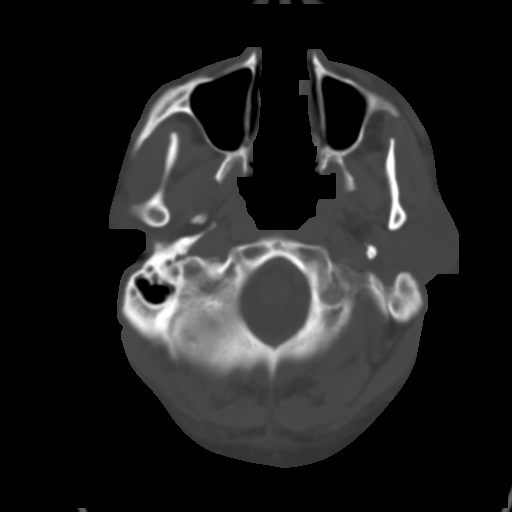
[im 9/35  brain]
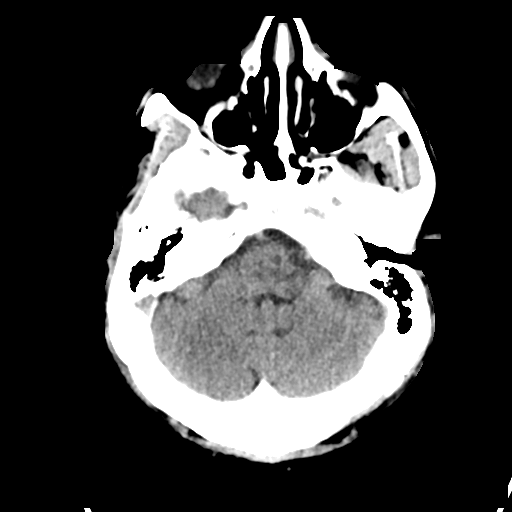
[im 13/35  brain]
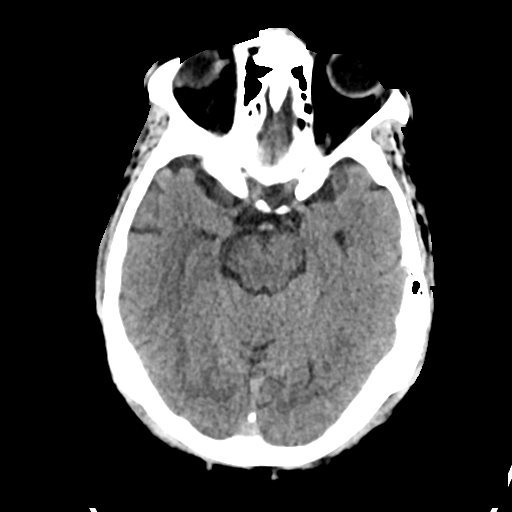
[im 18/35  brain]
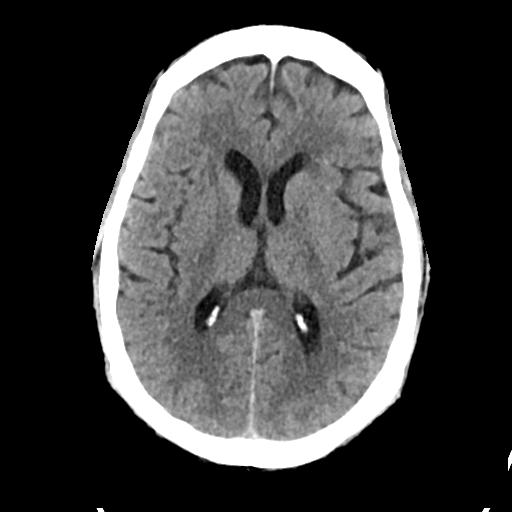
[im 22/35  brain]
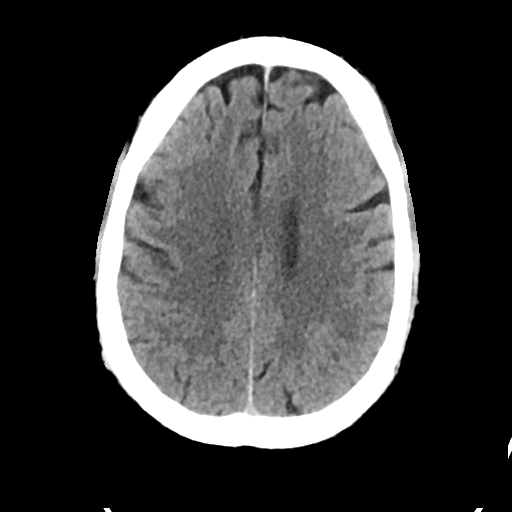
[im 22/35  bone]
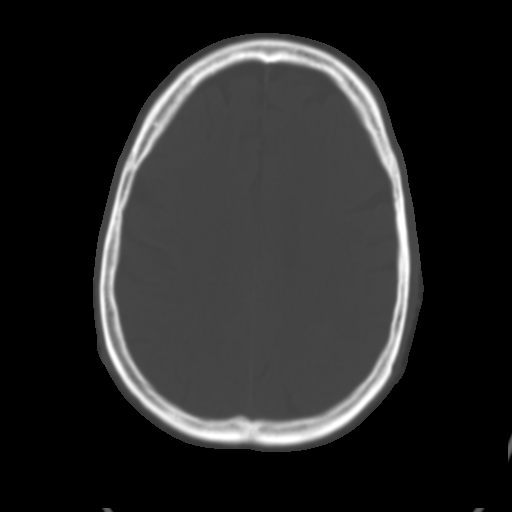
[im 26/35  brain]
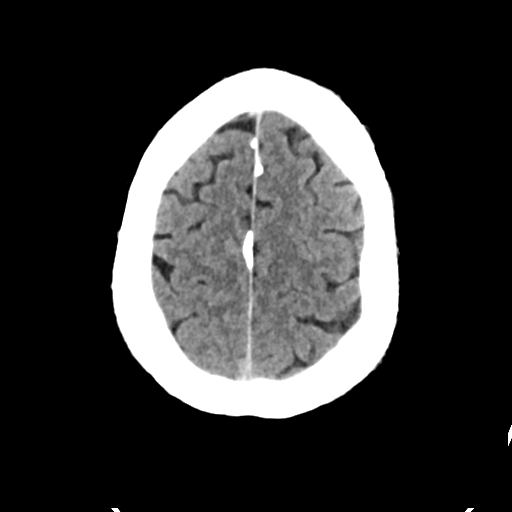
[im 30/35  brain]
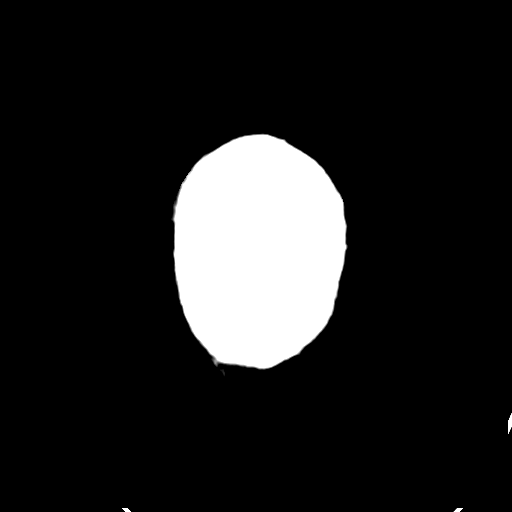

[Series 4: head bone · axial · 0.41mm/px · z∈[-116,-82]mm · 3 of 87 slices shown]
[im 9/87  bone]
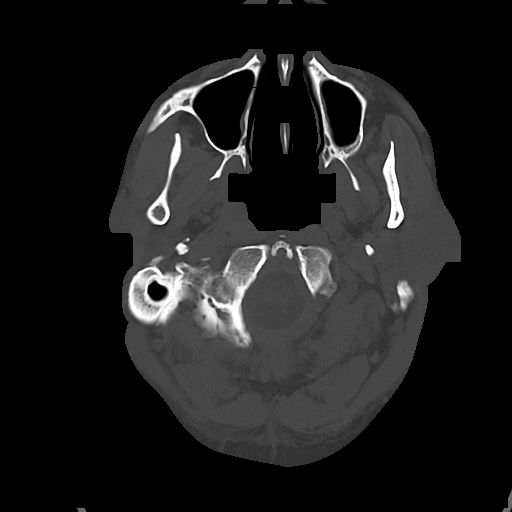
[im 18/87  bone]
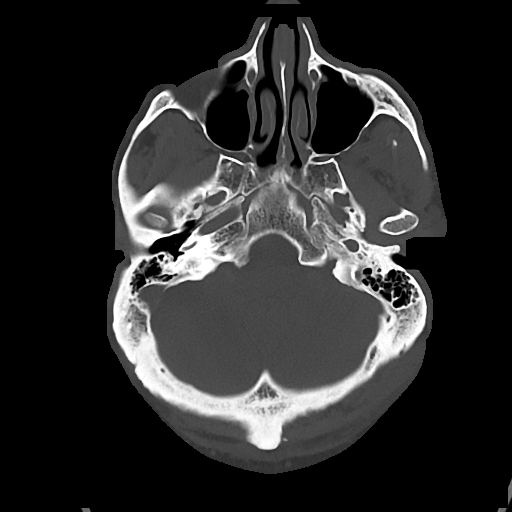
[im 26/87  bone]
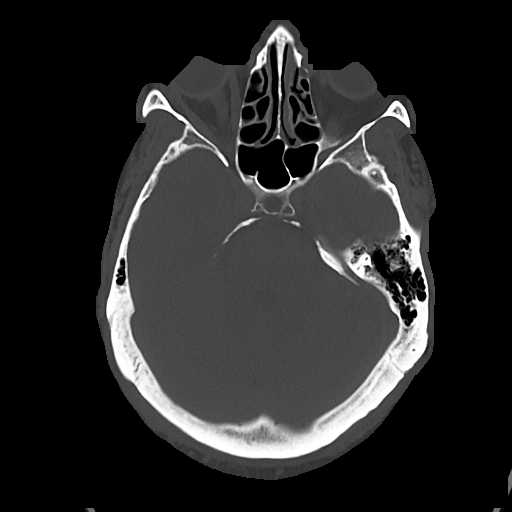

[Series 5: cor soft · coronal · 0.34mm/px · 3 of 74 slices shown]
[im 25/74  brain]
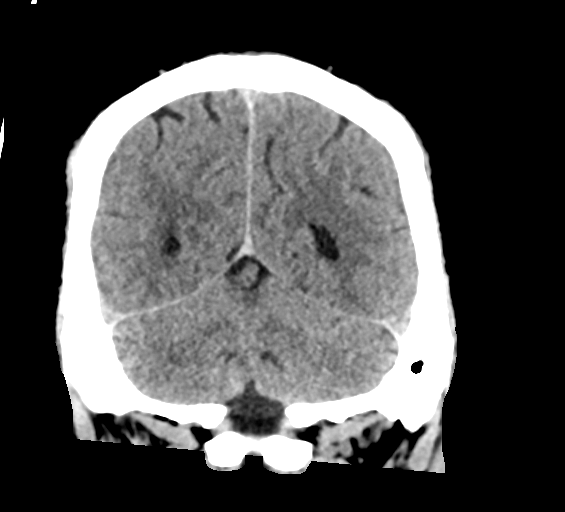
[im 33/74  brain]
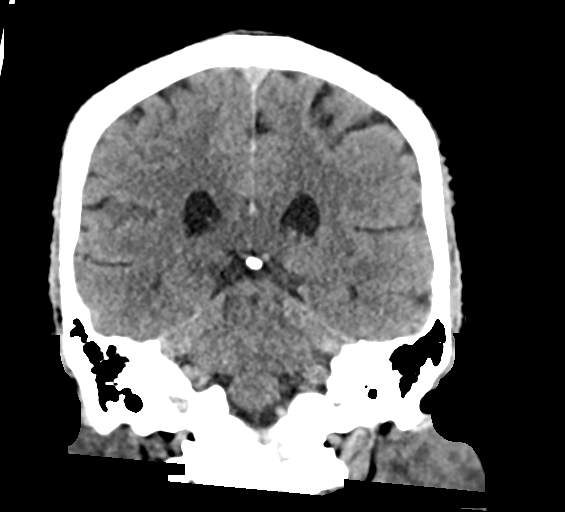
[im 41/74  brain]
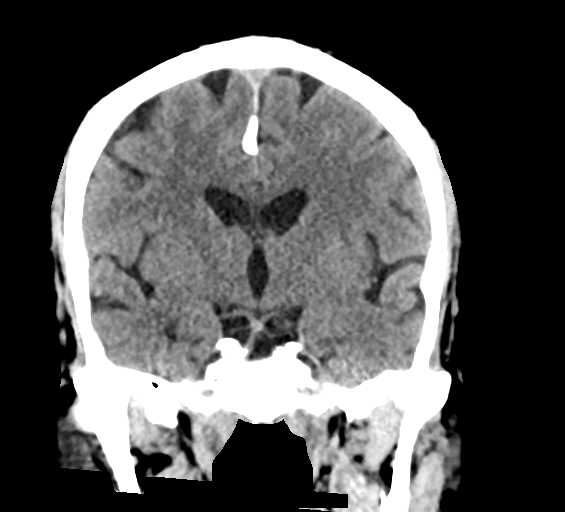

[Series 6: sag soft · sagittal · 0.34mm/px · 3 of 65 slices shown]
[im 22/65  brain]
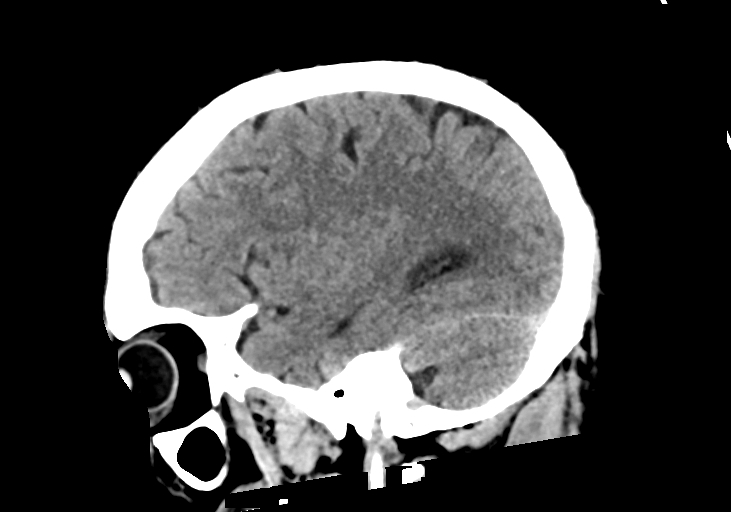
[im 33/65  brain]
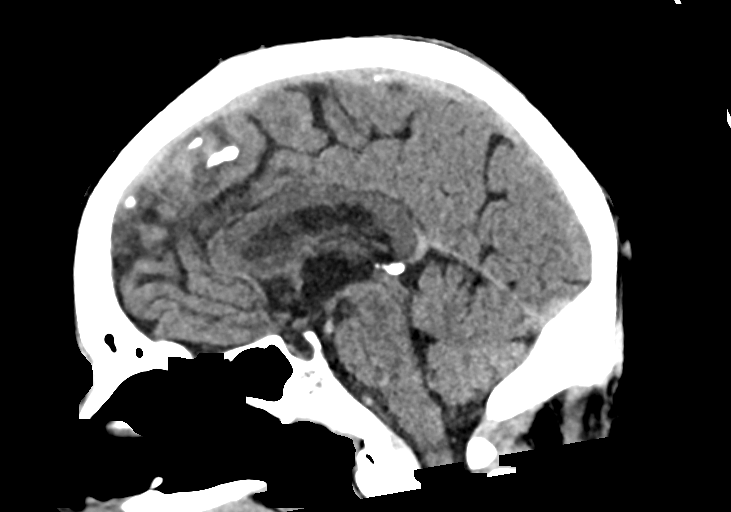
[im 43/65  brain]
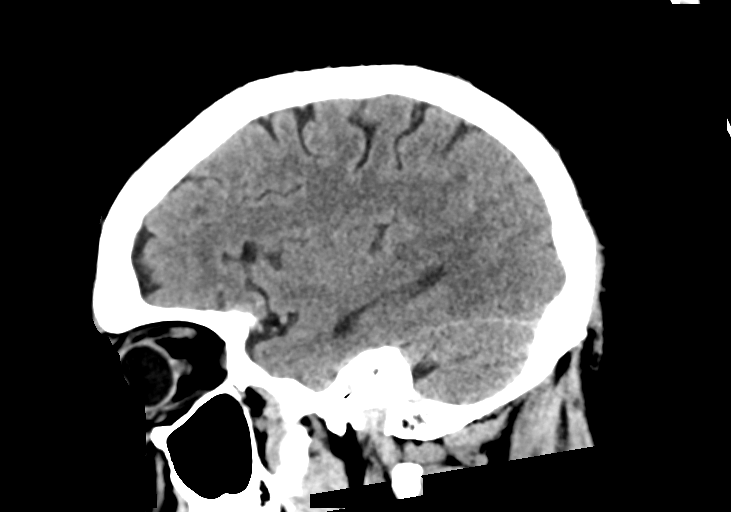

[16 of 47 positions shown; findings below may reference images not displayed]

FINDINGS: Brain: No acute territorial infarction, hemorrhage or intracranial
mass is seen. Minimal small vessel ischemic changes of the white
matter. Stable ventricle size.

Vascular: No hyperdense vessels. Minimal calcification at the
carotid siphon.

Skull: Normal. Negative for fracture or focal lesion.

Sinuses/Orbits: Mucosal thickening in the maxillary and ethmoid
sinuses. No acute orbital abnormality.

Other: None
IMPRESSION: No CT evidence for acute intracranial abnormality

## 2018-04-16 NOTE — Progress Notes (Signed)
Pt denies any acute cardiopulmonary issues. Pt under the care of Dr. Eden Emms, Cardiology. Pt made aware to stop taking vitamins, fish oil and herbal medications. Do not take any NSAIDs ie: Ibuprofen, Advil, Naproxen (Aleve), Motrin, BC and Goody Powder. Pt stated that his last dose of Lovenox was taken this morning as instructed. Pt stated that he was instructed to take Losartan on DOS. Pt stated that his DM is diet controlled. Pt verbalized understanding of all pre-op instructions.

## 2018-04-16 NOTE — Progress Notes (Signed)
   04/16/18 1712  OBSTRUCTIVE SLEEP APNEA  Have you ever been diagnosed with sleep apnea through a sleep study? No  Do you snore loudly (loud enough to be heard through closed doors)?  1  Do you often feel tired, fatigued, or sleepy during the daytime (such as falling asleep during driving or talking to someone)? 0  Has anyone observed you stop breathing during your sleep? 0  Do you have, or are you being treated for high blood pressure? 1  BMI more than 35 kg/m2? 0  Age > 50 (1-yes) 1  Neck circumference greater than:Male 16 inches or larger, Male 17inches or larger? 1  Male Gender (Yes=1) 1  Obstructive Sleep Apnea Score 5

## 2018-04-17 ENCOUNTER — Encounter (HOSPITAL_COMMUNITY): Payer: Self-pay | Admitting: *Deleted

## 2018-04-17 ENCOUNTER — Ambulatory Visit (HOSPITAL_COMMUNITY)
Admission: RE | Admit: 2018-04-17 | Discharge: 2018-04-17 | Disposition: A | Discharge status: Home / Self Care | Payer: Medicaid Other | Source: Ambulatory Visit | Attending: Gastroenterology | Admitting: Gastroenterology

## 2018-04-17 ENCOUNTER — Encounter (HOSPITAL_COMMUNITY)
Admission: RE | Disposition: A | Discharge status: Home / Self Care | Payer: Self-pay | Source: Ambulatory Visit | Attending: Gastroenterology

## 2018-04-17 ENCOUNTER — Ambulatory Visit (HOSPITAL_COMMUNITY): Payer: Medicaid Other | Admitting: Anesthesiology

## 2018-04-17 DIAGNOSIS — F419 Anxiety disorder, unspecified: Secondary | ICD-10-CM | POA: Insufficient documentation

## 2018-04-17 DIAGNOSIS — K648 Other hemorrhoids: Secondary | ICD-10-CM | POA: Insufficient documentation

## 2018-04-17 DIAGNOSIS — R569 Unspecified convulsions: Secondary | ICD-10-CM | POA: Diagnosis not present

## 2018-04-17 DIAGNOSIS — K5730 Diverticulosis of large intestine without perforation or abscess without bleeding: Secondary | ICD-10-CM | POA: Insufficient documentation

## 2018-04-17 DIAGNOSIS — F319 Bipolar disorder, unspecified: Secondary | ICD-10-CM | POA: Insufficient documentation

## 2018-04-17 DIAGNOSIS — I10 Essential (primary) hypertension: Secondary | ICD-10-CM | POA: Diagnosis not present

## 2018-04-17 DIAGNOSIS — M1990 Unspecified osteoarthritis, unspecified site: Secondary | ICD-10-CM | POA: Diagnosis not present

## 2018-04-17 DIAGNOSIS — K219 Gastro-esophageal reflux disease without esophagitis: Secondary | ICD-10-CM | POA: Insufficient documentation

## 2018-04-17 DIAGNOSIS — Z955 Presence of coronary angioplasty implant and graft: Secondary | ICD-10-CM | POA: Diagnosis not present

## 2018-04-17 DIAGNOSIS — R51 Headache: Secondary | ICD-10-CM | POA: Insufficient documentation

## 2018-04-17 DIAGNOSIS — Z7901 Long term (current) use of anticoagulants: Secondary | ICD-10-CM | POA: Diagnosis not present

## 2018-04-17 DIAGNOSIS — I25119 Atherosclerotic heart disease of native coronary artery with unspecified angina pectoris: Secondary | ICD-10-CM | POA: Diagnosis not present

## 2018-04-17 DIAGNOSIS — K625 Hemorrhage of anus and rectum: Secondary | ICD-10-CM | POA: Insufficient documentation

## 2018-04-17 DIAGNOSIS — E119 Type 2 diabetes mellitus without complications: Secondary | ICD-10-CM | POA: Insufficient documentation

## 2018-04-17 HISTORY — DX: Anxiety disorder, unspecified: F41.9

## 2018-04-17 HISTORY — DX: Unspecified convulsions: R56.9

## 2018-04-17 HISTORY — DX: Bipolar disorder, unspecified: F31.9

## 2018-04-17 HISTORY — PX: COLONOSCOPY WITH PROPOFOL: SHX5780

## 2018-04-17 HISTORY — DX: Hemorrhage of anus and rectum: K62.5

## 2018-04-17 SURGERY — COLONOSCOPY WITH PROPOFOL
Anesthesia: Monitor Anesthesia Care

## 2018-04-17 MED ORDER — PROPOFOL 10 MG/ML IV BOLUS
INTRAVENOUS | Status: DC | PRN
  Administered 2018-04-17: 40 mg via INTRAVENOUS
  Administered 2018-04-17: 30 mg via INTRAVENOUS

## 2018-04-17 MED ORDER — LACTATED RINGERS IV SOLN
INTRAVENOUS | Status: DC | PRN
  Administered 2018-04-17: 12:00:00 via INTRAVENOUS

## 2018-04-17 MED ORDER — PROPOFOL 500 MG/50ML IV EMUL
INTRAVENOUS | Status: DC | PRN
  Administered 2018-04-17: 100 ug/kg/min via INTRAVENOUS

## 2018-04-17 MED FILL — ALLOPURINOL 300 MG TABLET: 300 | 30 days supply | Qty: 30 | Fill #2

## 2018-04-17 SURGICAL SUPPLY — 22 items

## 2018-04-17 NOTE — Op Note (Signed)
[facility] Patient Name: Russell Carter Procedure Date : 04/17/2018 MRN: [id number] Attending MD: Graylin Shiver , MD Date of Birth: 23-Nov-1955 CSN: [id number] Age: 62 Admit Type: Outpatient Procedure:                Colonoscopy Indications:              Rectal bleeding Providers:                Graylin Shiver, MD, Weston Settle, RN, Arlee Muslim Tech., Technician Referring MD:              Medicines:                Propofol per Anesthesia Complications:            No immediate complications. Estimated Blood Loss:     Estimated blood loss: none. Procedure:                Pre-Anesthesia Assessment:                           - Prior to the procedure, a History and Physical                            was performed, and patient medications and                            allergies were reviewed. The patient's tolerance of                            previous anesthesia was also reviewed. The risks                            and benefits of the procedure and the sedation                            options and risks were discussed with the patient.                            All questions were answered, and informed consent                            was obtained. Prior Anticoagulants: The patient has                            taken Coumadin (warfarin), last dose was 6 days                            prior to procedure. Was on Lovenox bridge. Last                            dose yesterday. ASA Grade Assessment: III - A  patient with severe systemic disease. After                            reviewing the risks and benefits, the patient was                            deemed in satisfactory condition to undergo the                            procedure.                           After obtaining informed consent, the colonoscope                            was passed under direct vision. Throughout the     procedure, the patient's blood pressure, pulse, and                            oxygen saturations were monitored continuously. The                            EC-[id number] ([id number]) scope was introduced through                            the anus and advanced to the the cecum, identified                            by appendiceal orifice and ileocecal valve. The                            ileocecal valve, appendiceal orifice, and rectum                            were photographed. The colonoscopy was performed                            without difficulty. The patient tolerated the                            procedure well. The quality of the bowel                            preparation was adequate. Scope In: 1:34:11 PM Scope Out: 1:48:07 PM Scope Withdrawal Time: 0 hours 6 minutes 8 seconds  Total Procedure Duration: 0 hours 13 minutes 56 seconds  Findings:      The perianal and digital rectal examinations were normal.      Scattered diverticula were found in the sigmoid colon, descending colon       and ascending colon.      Internal hemorrhoids were found during retroflexion. The hemorrhoids       were small.      The exam was otherwise without abnormality. Impression:               - Diverticulosis in the sigmoid colon, in the  descending colon and in the ascending colon.                           - Internal hemorrhoids.                           - The examination was otherwise normal.                           - No specimens collected. Recommendation:           - Resume regular diet.                           - Continue present medications.                           - Repeat colonoscopy in 10 years for screening                            purposes. Procedure Code(s):        --- Professional ---                           [phone], Colonoscopy, flexible; diagnostic, including                            collection of specimen(s) by brushing or washing,                             when performed (separate procedure) Diagnosis Code(s):        --- Professional ---                           K64.8, Other hemorrhoids                           K62.5, Hemorrhage of anus and rectum                           K57.30, Diverticulosis of large intestine without                            perforation or abscess without bleeding CPT copyright 2017 American [employer] Association. All rights reserved. The codes documented in this report are preliminary and upon coder review may  be revised to meet current compliance requirements. Graylin Shiver, MD 04/17/2018 1:55:11 PM This report has been signed electronically. Number of Addenda: 0

## 2018-04-17 NOTE — Anesthesia Postprocedure Evaluation (Signed)
Anesthesia Post Note  Patient: Russell Carter  Procedure(s) Performed: COLONOSCOPY WITH PROPOFOL (N/A )     Patient location during evaluation: PACU Anesthesia Type: MAC Level of consciousness: awake and alert Pain management: pain level controlled Vital Signs Assessment: post-procedure vital signs reviewed and stable Respiratory status: spontaneous breathing, nonlabored ventilation and respiratory function stable Cardiovascular status: stable and blood pressure returned to baseline Postop Assessment: no apparent nausea or vomiting Anesthetic complications: no    Last Vitals:  Vitals:   04/17/18 1355 04/17/18 1405  BP: 130/83 136/85  Pulse: 73 75  Resp: (!) 21 18  Temp:    SpO2: 100% 96%    Last Pain:  Vitals:   04/17/18 1405  TempSrc:   PainSc: 0-No pain                 Khamia Stambaugh,W. EDMOND

## 2018-04-17 NOTE — Discharge Instructions (Signed)
YOU HAD AN ENDOSCOPIC PROCEDURE TODAY: Refer to the procedure report and other information in the discharge instructions given to you for any specific questions about what was found during the examination. If this information does not answer your questions, please call [facility] office at [phone] to clarify.  ° °YOU SHOULD EXPECT: Some feelings of bloating in the abdomen. Passage of more gas than usual. Walking can help get rid of the air that was put into your GI tract during the procedure and reduce the bloating. If you had a lower endoscopy (such as a colonoscopy or flexible sigmoidoscopy) you may notice spotting of blood in your stool or on the toilet paper. Some abdominal soreness may be present for a day or two, also. ° °DIET: Your first meal following the procedure should be a light meal and then it is ok to progress to your normal diet. A half-sandwich or bowl of soup is an example of a good first meal. Heavy or fried foods are harder to digest and may make you feel nauseous or bloated. Drink plenty of fluids but you should avoid alcoholic beverages for 24 hours. If you had a esophageal dilation, please see attached instructions for diet.  ° °ACTIVITY: Your care partner should take you home directly after the procedure. You should plan to take it easy, moving slowly for the rest of the day. You can resume normal activity the day after the procedure however YOU SHOULD NOT DRIVE, use power tools, machinery or perform tasks that involve climbing or major physical exertion for 24 hours (because of the sedation medicines used during the test).  ° °SYMPTOMS TO REPORT IMMEDIATELY: °A gastroenterologist can be reached at any hour. Please call [phone]  for any of the following symptoms:  °Following lower endoscopy (colonoscopy, flexible sigmoidoscopy) °Excessive amounts of blood in the stool  °Significant tenderness, worsening of abdominal pains  °Swelling of the abdomen that is new, acute  °Fever of 100° or  higher  °Following upper endoscopy (EGD, EUS, ERCP, esophageal dilation) °Vomiting of blood or coffee ground material  °New, significant abdominal pain  °New, significant chest pain or pain under the shoulder blades  °Painful or persistently difficult swallowing  °New shortness of breath  °Black, tarry-looking or red, bloody stools ° °FOLLOW UP:  °If any biopsies were taken you will be contacted by phone or by letter within the next 1-3 weeks. Call [phone]  if you have not heard about the biopsies in 3 weeks.  °Please also call with any specific questions about appointments or follow up tests. ° °

## 2018-04-17 NOTE — Anesthesia Preprocedure Evaluation (Addendum)
Anesthesia Evaluation  Patient identified by MRN, date of birth, ID band Patient awake    Reviewed: Allergy & Precautions, H&P , NPO status , Patient's Chart, lab work & pertinent test results  Airway Mallampati: II  TM Distance: >3 FB Neck ROM: Full    Dental no notable dental hx. (+) Teeth Intact, Dental Advisory Given   Pulmonary neg pulmonary ROS,    Pulmonary exam normal breath sounds clear to auscultation       Cardiovascular hypertension, Pt. on medications + angina + CAD and + Cardiac Stents   Rhythm:Regular Rate:Normal     Neuro/Psych  Headaches, Seizures -, Well Controlled,  Anxiety Depression Bipolar Disorder CVA negative psych ROS   GI/Hepatic Neg liver ROS, GERD  Medicated and Controlled,  Endo/Other  diabetes  Renal/GU negative Renal ROS  negative genitourinary   Musculoskeletal  (+) Arthritis , Osteoarthritis,    Abdominal   Peds  Hematology negative hematology ROS (+)   Anesthesia Other Findings   Reproductive/Obstetrics negative OB ROS                            Anesthesia Physical Anesthesia Plan  ASA: III  Anesthesia Plan: MAC   Post-op Pain Management:    Induction: Intravenous  PONV Risk Score and Plan: 1 and Propofol infusion  Airway Management Planned: Simple Face Mask  Additional Equipment:   Intra-op Plan:   Post-operative Plan:   Informed Consent: I have reviewed the patients History and Physical, chart, labs and discussed the procedure including the risks, benefits and alternatives for the proposed anesthesia with the patient or authorized representative who has indicated his/her understanding and acceptance.   Dental advisory given  Plan Discussed with: CRNA  Anesthesia Plan Comments:         Anesthesia Quick Evaluation

## 2018-04-17 NOTE — H&P (Signed)
The patient is here today in the endoscopy unit to have a colonoscopy because of rectal bleeding.  He has been on Coumadin but stopped it 6 days ago.  He has been on a Lovenox bridge.  Physical:  No distress  Heart regular rhythm allograft lungs clear  Abdomen soft and nontender  Impression: Rectal bleeding  Plan: Colonoscopy

## 2018-04-17 NOTE — Transfer of Care (Signed)
Immediate Anesthesia Transfer of Care Note  Patient: Russell Carter Below  Procedure(s) Performed: COLONOSCOPY WITH PROPOFOL (N/A )  Patient Location: Endoscopy Unit  Anesthesia Type:MAC  Level of Consciousness: drowsy and patient cooperative  Airway & Oxygen Therapy: Patient Spontanous Breathing and Patient connected to face mask oxygen  Post-op Assessment: Report given to RN and Post -op Vital signs reviewed and stable  Post vital signs: Reviewed and stable  Last Vitals:  Vitals Value Taken Time  BP 130/83 04/17/2018  1:53 PM  Temp 36.9 C 04/17/2018  1:53 PM  Pulse 73 04/17/2018  1:53 PM  Resp 17 04/17/2018  1:53 PM  SpO2 100 % 04/17/2018  1:53 PM  Vitals shown include unvalidated device data.  Last Pain:  Vitals:   04/17/18 1353  TempSrc: Oral  PainSc: 0-No pain      Patients Stated Pain Goal: 2 (53/64/68 0321)  Complications: No apparent anesthesia complications

## 2018-04-18 ENCOUNTER — Other Ambulatory Visit: Payer: Self-pay | Admitting: Family Medicine

## 2018-04-23 ENCOUNTER — Telehealth: Payer: Self-pay | Admitting: Pharmacist

## 2018-04-23 ENCOUNTER — Ambulatory Visit: Payer: Medicaid Other | Attending: Internal Medicine | Admitting: Pharmacist

## 2018-04-23 DIAGNOSIS — Z7901 Long term (current) use of anticoagulants: Secondary | ICD-10-CM | POA: Insufficient documentation

## 2018-04-23 DIAGNOSIS — Z5181 Encounter for therapeutic drug level monitoring: Secondary | ICD-10-CM | POA: Insufficient documentation

## 2018-04-23 DIAGNOSIS — I2782 Chronic pulmonary embolism: Secondary | ICD-10-CM | POA: Diagnosis not present

## 2018-04-23 LAB — POCT INR: INR: 1.2 — AB (ref 2.0–3.0)

## 2018-04-23 MED ORDER — ENOXAPARIN SODIUM 100 MG/ML ~~LOC~~ SOLN: 100.0000 mg | Freq: Two times a day (BID) | SUBCUTANEOUS | 0 refills | Status: DC

## 2018-04-23 MED FILL — METOCLOPRAMIDE 10 MG TABLET: 10 | 3 days supply | Qty: 10 | Fill #0

## 2018-04-23 MED FILL — ENOXAPARIN 100 MG/ML SYRN: 100 | 10 days supply | Qty: 20 | Fill #0

## 2018-04-23 NOTE — Progress Notes (Signed)
    Pharmacy Anticoagulation Clinic  Subjective: Patient presents today for INR monitoring. 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   Current dose of warfarin:  - 25 mg weekly - 5mg  MWF, 2.5 mg all other days  Adherence to warfarin: yes - also is taking Lovenox bridge s/p colonoscopy Signs/symptoms of bleeding: none Recent changes in diet: none Recent changes in medications: none Upcoming procedures that may impact anticoagulation: multiple teeth being extracted at upcoming dental appointment. Patient states that dental office should be faxing paperwork to me/clinic. States that dental office requires INR to be 1.5 - 2.0 before procedure. Will follow-up with and forward information to Dr. Laural Benes. She sees patient next Tuesday (04/29/18).  Objective: Today's INR = 1.2  Lab Results  Component Value Date   INR 1.6 (A) 04/10/2018   INR 2.2 04/07/2018   INR 4.4 (A) 03/24/2018     Assessment and Plan: Anticoagulation: Patient is Subtherapeutic based on INR of 1.2 and patient's INR goal of 2-3. Of note, he resumed anti-coagulation 04/18/18 so it has not yet been a week since he restarted warfarin. Pt has been therapeutic on this weekly dose before. Will have him continue current warfarin dose with re-check in two days (04/25/18). He will need to continue Lovenox with his subtherapeutic reading.  Patient states that dentist's office needs his INR in 1.5 - 2 range before extraction next Thursday (05/01/18). We have not received confirmation from dental office for this. I have informed patient's PCP and CMA of this. Will follow-up once that office reaches out to [place].  Patient verbalized understanding and was provided with written instructions. Next INR check planned for 04/25/18.   Seen with Mirna Mires, PharmD Candidate [employer]  Butch Penny, PharmD, CPP Clinical Pharmacist [facility] & [facility] [phone]

## 2018-04-23 NOTE — Telephone Encounter (Signed)
Dr. Laural Benes -   Patient had INR visit today. INR sub at 1.2, however, he has only just resumed warfarin (04/18/18) s/p colonoscopy. I had him continue current dose with Lovnox and will reassess Friday.  Of note, Russell Carter tells me that he has a dental appointment 05/01/18 to have multiple teeth extracted and that the dentist office is requiring his INR to be in 1.5 - 2 range. I told Russell Carter that we have not received confirmation of this. He states that he will call dentist's office and have them fax [place]. Wanted to make you aware.   Butch Penny, PharmD, CPP Clinical Pharmacist [facility] & [facility] [phone]

## 2018-04-25 ENCOUNTER — Ambulatory Visit: Payer: Medicaid Other | Attending: Internal Medicine | Admitting: Pharmacist

## 2018-04-25 ENCOUNTER — Encounter: Payer: Self-pay | Admitting: Pharmacist

## 2018-04-25 DIAGNOSIS — I2782 Chronic pulmonary embolism: Secondary | ICD-10-CM

## 2018-04-25 DIAGNOSIS — Z7902 Long term (current) use of antithrombotics/antiplatelets: Secondary | ICD-10-CM | POA: Insufficient documentation

## 2018-04-25 DIAGNOSIS — Z5181 Encounter for therapeutic drug level monitoring: Secondary | ICD-10-CM | POA: Insufficient documentation

## 2018-04-25 LAB — POCT INR: INR: 1.4 — AB (ref 2.0–3.0)

## 2018-04-25 NOTE — Patient Instructions (Signed)
Thank you for coming to see me today. Please do the following:   1. Today: evening Lovenox   2. Saturday (04/26/18): take half a tablet and Lovenox shots  3. Sunday (04/27/18): take half tablet and Lovenox shots  4. Monday (04/28/18): 1 tablet and NO LOVENOX  5. Tuesday (04/29/18): take half a tablet and NO LOVENOX - you will see Dr. Laural Benes on this day.

## 2018-04-25 NOTE — Progress Notes (Signed)
    Pharmacy Anticoagulation Clinic  Subjective: Patient presents today for INR monitoring. Anticoagulation indication is PE.   Pt has no complaints. Compliant with Lovenox. Continuing Lovenox for perioperative bridging (colonoscopy 04/17/18) per recommendation of pt's cardiologist.  Pt has not heard from dentist's office concerning upcomming extraction. We have reached out as well. Waiting for follow-up. Per pt, he is to obtain 1.5 - 2.0 range INR before procedure as directed by Dentist.   Current dose of warfarin:  - 5 mg MWF - 2.5 mg all other days  Adherence to warfarin: yes  Signs/symptoms of bleeding: none Recent changes in diet: none  Recent changes in medications: none Upcoming procedures that may impact anticoagulation: tooth extraction 05/01/18   Objective: Today's INR = 1.4  Lab Results  Component Value Date   INR 1.2 (A) 04/23/2018   INR 1.6 (A) 04/10/2018   INR 2.2 04/07/2018     Assessment and Plan: Anticoagulation: Patient is Subtherapeutic based on patient's INR of 1.4 and patient's INR goal of 2-3. Discussed with Dr. Laural Benes. Will continue current warfarin weekly schedule. Boosting or increasing could make INR above range needed for oral surgery.   Of note, Dr. Laural Benes and I have discussed and evaluated appropriateness for chronic anticoag in this patient. Per patient, his cardiologist recommended 6 months of anticoag with warfarin d/t PE episode ~1 yr ago. Furthermore, he claims cardiologist called this clot provoked d/t spinal surgery. Pt sees Dr. Laural Benes in clinic 04/29/18. Will reassess need for anticoag then.   Patient to continue perioperative bridge. Discussed with Dr. Laural Benes who recommends d/c of Lovenox after Sunday. Made this recommendation to patient. He verbalizes understanding of plan to stop Lovenox and continue only with Coumadin after Sunday.  Patient verbalized understanding and was provided with written instructions. Next INR check planned for  04/29/18.   Pt seen with Mirna Mires, PharmD Candidate [employer]  Butch Penny, PharmD, CPP Clinical Pharmacist [facility] & [facility] [phone]

## 2018-04-26 ENCOUNTER — Emergency Department (HOSPITAL_COMMUNITY): Payer: Medicaid Other

## 2018-04-26 ENCOUNTER — Inpatient Hospital Stay (HOSPITAL_COMMUNITY)
Admission: EM | Admit: 2018-04-26 | Discharge: 2018-05-01 | DRG: 287 | Disposition: A | Discharge status: Home / Self Care | Payer: Medicaid Other | Attending: Internal Medicine | Admitting: Internal Medicine

## 2018-04-26 DIAGNOSIS — J982 Interstitial emphysema: Secondary | ICD-10-CM | POA: Diagnosis present

## 2018-04-26 DIAGNOSIS — R1011 Right upper quadrant pain: Secondary | ICD-10-CM

## 2018-04-26 DIAGNOSIS — R55 Syncope and collapse: Secondary | ICD-10-CM

## 2018-04-26 DIAGNOSIS — Z86711 Personal history of pulmonary embolism: Secondary | ICD-10-CM

## 2018-04-26 DIAGNOSIS — I442 Atrioventricular block, complete: Secondary | ICD-10-CM | POA: Diagnosis present

## 2018-04-26 DIAGNOSIS — E7800 Pure hypercholesterolemia, unspecified: Secondary | ICD-10-CM | POA: Diagnosis present

## 2018-04-26 DIAGNOSIS — Z981 Arthrodesis status: Secondary | ICD-10-CM

## 2018-04-26 DIAGNOSIS — I252 Old myocardial infarction: Secondary | ICD-10-CM

## 2018-04-26 DIAGNOSIS — F319 Bipolar disorder, unspecified: Secondary | ICD-10-CM | POA: Diagnosis present

## 2018-04-26 DIAGNOSIS — K559 Vascular disorder of intestine, unspecified: Secondary | ICD-10-CM

## 2018-04-26 DIAGNOSIS — E872 Acidosis, unspecified: Secondary | ICD-10-CM | POA: Diagnosis present

## 2018-04-26 DIAGNOSIS — G40909 Epilepsy, unspecified, not intractable, without status epilepticus: Secondary | ICD-10-CM | POA: Diagnosis present

## 2018-04-26 DIAGNOSIS — N179 Acute kidney failure, unspecified: Secondary | ICD-10-CM | POA: Diagnosis present

## 2018-04-26 DIAGNOSIS — Z955 Presence of coronary angioplasty implant and graft: Secondary | ICD-10-CM

## 2018-04-26 DIAGNOSIS — I129 Hypertensive chronic kidney disease with stage 1 through stage 4 chronic kidney disease, or unspecified chronic kidney disease: Secondary | ICD-10-CM | POA: Diagnosis present

## 2018-04-26 DIAGNOSIS — R109 Unspecified abdominal pain: Secondary | ICD-10-CM | POA: Diagnosis present

## 2018-04-26 DIAGNOSIS — Z79899 Other long term (current) drug therapy: Secondary | ICD-10-CM

## 2018-04-26 DIAGNOSIS — E1122 Type 2 diabetes mellitus with diabetic chronic kidney disease: Secondary | ICD-10-CM | POA: Diagnosis present

## 2018-04-26 DIAGNOSIS — Z8673 Personal history of transient ischemic attack (TIA), and cerebral infarction without residual deficits: Secondary | ICD-10-CM

## 2018-04-26 DIAGNOSIS — I2510 Atherosclerotic heart disease of native coronary artery without angina pectoris: Secondary | ICD-10-CM | POA: Diagnosis present

## 2018-04-26 DIAGNOSIS — K219 Gastro-esophageal reflux disease without esophagitis: Secondary | ICD-10-CM | POA: Diagnosis present

## 2018-04-26 DIAGNOSIS — M109 Gout, unspecified: Secondary | ICD-10-CM | POA: Diagnosis present

## 2018-04-26 DIAGNOSIS — K5730 Diverticulosis of large intestine without perforation or abscess without bleeding: Secondary | ICD-10-CM | POA: Diagnosis present

## 2018-04-26 DIAGNOSIS — Z7901 Long term (current) use of anticoagulants: Secondary | ICD-10-CM

## 2018-04-26 LAB — I-STAT TROPONIN, ED: Troponin i, poc: 0.01 ng/mL (ref 0.00–0.08)

## 2018-04-26 LAB — I-STAT CHEM 8, ED
BUN: 38 mg/dL — AB (ref 8–23)
CHLORIDE: 110 mmol/L (ref 98–111)
Calcium, Ion: 1.11 mmol/L — ABNORMAL LOW (ref 1.15–1.40)
Creatinine, Ser: 2.9 mg/dL — ABNORMAL HIGH (ref 0.61–1.24)
GLUCOSE: 112 mg/dL — AB (ref 70–99)
HCT: 42 % (ref 39.0–52.0)
Hemoglobin: 14.3 g/dL (ref 13.0–17.0)
POTASSIUM: 3.8 mmol/L (ref 3.5–5.1)
Sodium: 139 mmol/L (ref 135–145)
TCO2: 18 mmol/L — ABNORMAL LOW (ref 22–32)

## 2018-04-26 LAB — CBC WITH DIFFERENTIAL/PLATELET
ABS IMMATURE GRANULOCYTES: 0 10*3/uL (ref 0.0–0.1)
BASOS PCT: 0 %
Basophils Absolute: 0 10*3/uL (ref 0.0–0.1)
Eosinophils Absolute: 0 10*3/uL (ref 0.0–0.7)
Eosinophils Relative: 1 %
HCT: 43.7 % (ref 39.0–52.0)
Hemoglobin: 13.8 g/dL (ref 13.0–17.0)
IMMATURE GRANULOCYTES: 0 %
Lymphocytes Relative: 44 %
Lymphs Abs: 2.7 10*3/uL (ref 0.7–4.0)
MCH: 29.8 pg (ref 26.0–34.0)
MCHC: 31.6 g/dL (ref 30.0–36.0)
MCV: 94.4 fL (ref 78.0–100.0)
MONOS PCT: 13 %
Monocytes Absolute: 0.8 10*3/uL (ref 0.1–1.0)
NEUTROS ABS: 2.5 10*3/uL (ref 1.7–7.7)
NEUTROS PCT: 42 %
PLATELETS: 197 10*3/uL (ref 150–400)
RBC: 4.63 MIL/uL (ref 4.22–5.81)
RDW: 14.2 % (ref 11.5–15.5)
WBC: 6.1 10*3/uL (ref 4.0–10.5)

## 2018-04-26 LAB — POC OCCULT BLOOD, ED: FECAL OCCULT BLD: NEGATIVE

## 2018-04-26 LAB — I-STAT CG4 LACTIC ACID, ED: LACTIC ACID, VENOUS: 3.6 mmol/L — AB (ref 0.5–1.9)

## 2018-04-26 LAB — PROTIME-INR
INR: 1.98
PROTHROMBIN TIME: 22.3 s — AB (ref 11.4–15.2)

## 2018-04-26 NOTE — ED Triage Notes (Signed)
Pt BIB [facility] pt was found unresponsive, was being paced and hypotensive. Pt arrived not being paced and on a non rebreather. Pt was responsive to painful stimuli. Pt is now responsive to verbal stimuli

## 2018-04-26 NOTE — ED Provider Notes (Signed)
[name] EMERGENCY DEPARTMENT Provider Note   CSN: [id number] Arrival date & time: 04/26/18  2319     History   Chief Complaint No chief complaint on file.   HPI Russell Carter is a 62 y.o. male.  Level 5 caveat for acute if condition.  Patient presents via EMS with decreased responsiveness currently being paced.  Reportedly was last seen normal at 8 PM.   he was apparently complaining of abdominal pain and leg pain that came on suddenly.  EMS reports waxing and waning levels of consciousness that comes and goes.  They did start pacing him when patient was hypotensive in the 60s and minimally responsive.  On arrival he is obtunded but arouses to questioning and answers questions appropriately.  Denies chest pain.  Complains of abdominal pain and anterior leg pain that came on suddenly around 5 PM.  Has had nausea but no vomiting.  No chest pain.  Denies any blood in his stool.  Patient does take Lovenox and Coumadin for history of pulmonary embolism.  Reports having a colonoscopy about 10 days ago that was normal.  Denies any history of vomiting, blood in stool, cough, fever, trauma.  The history is provided by the patient and the EMS personnel. The history is limited by the condition of the patient.    Past Medical History:  Diagnosis Date  . Anginal pain (HCC)   . Anxiety   . Arthritis   . Bipolar disorder (HCC)   . Blood in stool   . CAD (coronary artery disease)    a. 2011: cath showing mod non-obstructive disease b. 02/2016: cath with 95% stenosis in the Mid RCA. DES placed.  . Cervical stenosis of spine   . Cocaine abuse (HCC)    history  . Depression   . Diabetes mellitus without complication (HCC)   . Gout, unspecified   . Headache   . HTN (hypertension)   . Hypercholesterolemia   . Hyperglycemia   . Migraines   . Myocardial infarction [facility]) 2008   drug cocaine and marijuana at time of mi none  since    . Pneumonia   . Rectal bleeding   . Renal  insufficiency   . Seizures (HCC)   . Stroke [facility])     Patient Active Problem List   Diagnosis Date Noted  . Left knee pain 01/10/2018  . Left arm weakness 08/07/2017  . Numbness and tingling in both hands 07/31/2017  . Gastroesophageal reflux disease without esophagitis 07/31/2017  . Chronic pain disorder 06/05/2017  . Gastrointestinal hemorrhage with melena   . Pulmonary embolism (HCC) 02/14/2017  . Diabetes mellitus type 2 in nonobese (HCC)   . Cocaine abuse (HCC)   . Depression   . Stage 3 chronic kidney disease (HCC)   . History of CVA (cerebrovascular accident)   . Prediabetes   . Cervical spondylosis with radiculopathy 01/24/2017  . Dyslipidemia 03/15/2016  . Complete heart block (HCC) 03/14/2016  . Cervical spinal stenosis 03/14/2016  . CAD-50% LAD 2011 12/14/2014  . Gout 11/16/2014  . Polysubstance abuse (HCC) 01/15/2013  . DIZZINESS 06/05/2010  . Essential hypertension 05/18/2009    Past Surgical History:  Procedure Laterality Date  . ANTERIOR CERVICAL DECOMP/DISCECTOMY FUSION N/A 01/24/2017   Procedure: Cervical Four-Five,Cervical Five Six,Cervical Six-Seven Anterior cervical discectomy with fusion and plate with Cervical Three-Thoracic- One Laminectomy/Foraminotomy with Cervical Three-Thoracic- One Fixation and fusion;  Surgeon: Loura Halt Ditty, MD;  Location: [facility] OR;  Service: Neurosurgery;  Laterality: N/A;  Part-1 anterior approach  . CARDIAC CATHETERIZATION  2008; ~ 2011   -40%lad  . CARDIAC CATHETERIZATION N/A 03/16/2016   Procedure: Left Heart Cath and Coronary Angiography;  Surgeon: Marykay Lex, MD;  Location: [facility] INVASIVE CV LAB;  Service: Cardiovascular;  Laterality: N/A;  . COLONOSCOPY WITH PROPOFOL N/A 04/17/2018   Procedure: COLONOSCOPY WITH PROPOFOL;  Surgeon: Graylin Shiver, MD;  Location: [facility] ENDOSCOPY;  Service: Endoscopy;  Laterality: N/A;  . CORONARY ANGIOPLASTY     STENTS  . EYE SURGERY Bilateral    "for double vision"  . INGUINAL  HERNIA REPAIR  05/12/2012   Procedure: HERNIA REPAIR INGUINAL ADULT;  Surgeon: Liz Malady, MD;  Location: [facility];  Service: General;  Laterality: Right;  . INGUINAL HERNIA REPAIR Left 02/02/2003   Dr. Violeta Gelinas. repair with mesh. Same procedure done again in  05/22/2004   . INGUINAL HERNIA REPAIR Left ?date 2nd OR  . POSTERIOR CERVICAL FUSION/FORAMINOTOMY N/A 01/24/2017   Procedure: Cervical Three-Thoracic One-Laminectomy/Foraminotomy with Cervical Three-Thoracic One Fixation and fusion;  Surgeon: Loura Halt Ditty, MD;  Location: [facility] OR;  Service: Neurosurgery;  Laterality: N/A;  Part-2 Posterior approach  . RECONSTRUCT / STABILIZE DISTAL ULNA    . SHOULDER OPEN ROTATOR CUFF REPAIR Left 2010  . SHOULDER SURGERY Left    "injured on the job; had to cut small bone out"  . THUMB FUSION Right         Home Medications    Prior to Admission medications   Medication Sig Start Date End Date Taking? Authorizing Provider  allopurinol (ZYLOPRIM) 300 MG tablet Take 1 tablet (300 mg total) by mouth daily. 05/02/17   Quentin Angst, MD  ALPRAZolam Prudy Feeler) 0.5 MG tablet Take 1 tablet (0.5 mg total) by mouth 2 (two) times daily as needed for anxiety. 12/12/17   Quentin Angst, MD  CALCIUM-VITAMIN D PO Take 1 tablet by mouth daily.    [provider]  clotrimazole (LOTRIMIN) 1 % cream Apply 1 application topically 2 (two) times daily.    [provider]  divalproex (DEPAKOTE) 250 MG DR tablet Take 250 mg by mouth 3 (three) times daily.    [provider]  DULoxetine (CYMBALTA) 30 MG capsule Take 1 capsule (30 mg total) by mouth 2 (two) times daily. 11/20/17   Anders Simmonds, PA-C  enoxaparin (LOVENOX) 100 MG/ML injection Inject 1 mL (100 mg total) into the skin every 12 (twelve) hours. 04/23/18   Marcine Matar, MD  gabapentin (NEURONTIN) 400 MG capsule Take 1 capsule (400 mg total) 2 (two) times daily by mouth. 09/04/17   Jegede,  Phylliss Blakes, MD  gabapentin (NEURONTIN) 600 MG tablet Take 600 mg by mouth 3 (three) times daily. 07/24/17   [provider]  hydrOXYzine (ATARAX/VISTARIL) 25 MG tablet TAKE 1 TABLET BY MOUTH EVERY 6 HOURS AS NEEDED FOR ANXIETY 02/05/18   Hoy Register, MD  ibuprofen (ADVIL,MOTRIN) 800 MG tablet Take 1 tablet (800 mg total) by mouth every 8 (eight) hours as needed. 11/20/17   Anders Simmonds, PA-C  losartan (COZAAR) 100 MG tablet TAKE 1 TABLET BY MOUTH DAILY. 04/07/18   Marcine Matar, MD  meclizine (ANTIVERT) 25 MG tablet 1 tab PO daily x 1 week then daily PRN 03/04/18   Marcine Matar, MD  methocarbamol (ROBAXIN) 750 MG tablet TAKE 1 TABLET BY MOUTH EVERY 8 HOURS AS NEEDED FOR MUSCLE SPASMS. 02/13/18   Hoy Register, MD  metoCLOPramide (REGLAN) 10 MG  tablet TAKE 1 TABLET BY MOUTH EVERY 6 HOURS AS NEEDED FOR NAUSEA OR HEADACHE. 04/22/18   Marcine Matar, MD  Multiple Vitamin (MULTIVITAMIN) tablet Take 1 tablet by mouth daily.    [provider]  nitroGLYCERIN (NITROSTAT) 0.4 MG SL tablet Place 1 tablet (0.4 mg total) under the tongue every 5 (five) minutes as needed for chest pain. Patient not taking: Reported on 04/11/2018 05/02/17   Quentin Angst, MD  nitroGLYCERIN (NITROSTAT) 0.4 MG SL tablet PLACE 1 TABLET UNDER THE TONGUE EVERY 5 MINUTES AS NEEDED FOR CHEST PAIN 01/13/18   Leone Brand, NP  omeprazole (PRILOSEC) 20 MG capsule Take 20 mg by mouth daily. 01/07/18   [provider]  oxyCODONE HCl 7.5 MG TABA Take 7.5 mg by mouth 3 (three) times daily.     [provider]  pantoprazole (PROTONIX) 40 MG tablet Take 1 tablet (40 mg total) by mouth at bedtime. 12/04/17   Quentin Angst, MD  rosuvastatin (CRESTOR) 40 MG tablet Take 1 tablet (40 mg total) at bedtime by mouth. 09/04/17 04/11/18  Quentin Angst, MD  warfarin (COUMADIN) 5 MG tablet Take 1 tablet (5 mg total) by mouth daily. Patient taking differently: Take 5 mg by mouth daily.  Take 5mg  by mouth on Monday, Wednesday and Friday and 2.5mg  Tuesday, Thursday, Saturday and Sunday 01/29/18   Quentin Angst, MD  famotidine (PEPCID) 20 MG tablet Take 1 tablet (20 mg total) by mouth 2 (two) times daily. 12/04/11 12/30/11  Grant Fontana, PA-C  TACRINE HYDROCHLORIDE PO Take 25 mg by mouth daily.    12/30/11  [provider]    Family History Family History  Problem Relation Age of Onset  . Heart attack Mother        x7. Still alive  . Heart attack Father        deceased b/c of MI  . Heart attack Brother        older, deceased b/c of MI   . Cancer Brother        bone cancer in 1 brother, ? brain cancer in other brother    Social History Social History   Tobacco Use  . Smoking status: Never Smoker  . Smokeless tobacco: Never Used  Substance Use Topics  . Alcohol use: Yes    Comment: 16 oz beer every 2-3 days  . Drug use: Yes    Types: "Crack" cocaine, Marijuana, Cocaine    Comment:  "quit 2008"     Allergies   Shellfish allergy   Review of Systems Review of Systems  Unable to perform ROS: Acuity of condition     Physical Exam Updated Vital Signs BP 111/72   Pulse 81   Temp 97.9 F (36.6 C) (Oral)   Resp 13   Ht 5\' 11"  (1.803 m)   Wt 94.7 kg (208 lb 12.4 oz)   SpO2 96%   BMI 29.12 kg/m   Physical Exam  Constitutional: He is oriented to person, place, and time. He appears well-developed and well-nourished. No distress.  Obtunded. Awakes to voice, c/o abdominal pain Dry mucus membranes  HENT:  Head: Normocephalic and atraumatic.  Mouth/Throat: Oropharynx is clear and moist. No oropharyngeal exudate.  Eyes: Pupils are equal, round, and reactive to light. Conjunctivae and EOM are normal.  Neck: Normal range of motion. Neck supple.  No meningismus.  Cardiovascular: Normal rate, regular rhythm, normal heart sounds and intact distal pulses.  No murmur heard. Intact femoral and DP  pulses  Pulmonary/Chest: Effort normal and  breath sounds normal. No respiratory distress. He exhibits no tenderness.  Abdominal: Soft. He exhibits distension. There is tenderness. There is no rebound and no guarding.  Diffusely tender, distended. Voluntary guarding throughout  Musculoskeletal: Normal range of motion. He exhibits no edema or tenderness.  Neurological: He is alert and oriented to person, place, and time. No cranial nerve deficit. He exhibits normal muscle tone. Coordination normal.  No ataxia on finger to nose bilaterally. No pronator drift. 5/5 strength throughout. CN 2-12 intact.Equal grip strength. Sensation intact.   Skin: Skin is warm.  Psychiatric: He has a normal mood and affect. His behavior is normal.  Nursing note and vitals reviewed.    ED Treatments / Results  Labs (all labs ordered are listed, but only abnormal results are displayed) Labs Reviewed  COMPREHENSIVE METABOLIC PANEL - Abnormal; Notable for the following components:      Result Value   Chloride 112 (*)    CO2 13 (*)    Glucose, Bld 113 (*)    BUN 35 (*)    Creatinine, Ser 2.84 (*)    Calcium 8.0 (*)    Total Protein 6.1 (*)    GFR calc non Af Amer 22 (*)    GFR calc Af Amer 26 (*)    All other components within normal limits  PROTIME-INR - Abnormal; Notable for the following components:   Prothrombin Time 22.3 (*)    All other components within normal limits  ACETAMINOPHEN LEVEL - Abnormal; Notable for the following components:   Acetaminophen (Tylenol), Serum <10 (*)    All other components within normal limits  RAPID URINE DRUG SCREEN, HOSP PERFORMED - Abnormal; Notable for the following components:   Barbiturates   (*)    Value: Result not available. Reagent lot number recalled by manufacturer.   All other components within normal limits  URINALYSIS, ROUTINE W REFLEX MICROSCOPIC - Abnormal; Notable for the following components:   APPearance HAZY (*)    All other components within normal limits  VALPROIC ACID LEVEL - Abnormal;  Notable for the following components:   Valproic Acid Lvl 17 (*)    All other components within normal limits  CK - Abnormal; Notable for the following components:   Total CK 544 (*)    All other components within normal limits  GLUCOSE, CAPILLARY - Abnormal; Notable for the following components:   Glucose-Capillary 120 (*)    All other components within normal limits  I-STAT CHEM 8, ED - Abnormal; Notable for the following components:   BUN 38 (*)    Creatinine, Ser 2.90 (*)    Glucose, Bld 112 (*)    Calcium, Ion 1.11 (*)    TCO2 18 (*)    All other components within normal limits  I-STAT CG4 LACTIC ACID, ED - Abnormal; Notable for the following components:   Lactic Acid, Venous 3.60 (*)    All other components within normal limits  I-STAT ARTERIAL BLOOD GAS, ED - Abnormal; Notable for the following components:   pH, Arterial 7.239 (*)    Bicarbonate 16.1 (*)    TCO2 17 (*)    Acid-base deficit 11.0 (*)    All other components within normal limits  POCT I-STAT 3, ART BLOOD GAS (G3+) - Abnormal; Notable for the following components:   pH, Arterial 7.298 (*)    pO2, Arterial 60.0 (*)    TCO2 21 (*)    Acid-base deficit 6.0 (*)  All other components within normal limits  MRSA PCR SCREENING  CBC WITH DIFFERENTIAL/PLATELET  ETHANOL  SALICYLATE LEVEL  TROPONIN I  MAGNESIUM  CBC  PROCALCITONIN  BASIC METABOLIC PANEL  MAGNESIUM  PHOSPHORUS  TROPONIN I  TROPONIN I  TROPONIN I  HIV ANTIBODY (ROUTINE TESTING)  HEPATIC FUNCTION PANEL  BLOOD GAS, ARTERIAL  I-STAT TROPONIN, ED  I-STAT CG4 LACTIC ACID, ED  I-STAT CHEM 8, ED  I-STAT TROPONIN, ED  POC OCCULT BLOOD, ED  I-STAT CG4 LACTIC ACID, ED  TYPE AND SCREEN    EKG EKG Interpretation  Date/Time:  Saturday April 26 2018 23:23:06 EDT Ventricular Rate:  82 PR Interval:    QRS Duration: 96 QT Interval:  450 QTC Calculation: 526 R Axis:   56 Text Interpretation:  Sinus rhythm Abnormal R-wave progression, early  transition Nonspecific T abnormalities, lateral leads Prolonged QT interval No significant change was found Prolonged QT Confirmed by Glynn Octave [phone]) on 04/26/2018 11:34:25 PM   EKG Interpretation  Date/Time:  Sunday April 27 2018 04:23:57 EDT Ventricular Rate:  95 PR Interval:    QRS Duration: 91 QT Interval:  388 QTC Calculation: 488 R Axis:   29 Text Interpretation:  Sinus rhythm Borderline prolonged PR interval Abnormal R-wave progression, early transition Borderline prolonged QT interval Normal sinus rhythm Confirmed by Glynn Octave [phone]) on 04/27/2018 4:56:46 AM        Radiology Ct Abdomen Pelvis Wo Contrast  Result Date: 04/27/2018 CLINICAL DATA:  Acute generalized abdominal pain. EXAM: CT ABDOMEN AND PELVIS WITHOUT CONTRAST TECHNIQUE: Multidetector CT imaging of the abdomen and pelvis was performed following the standard protocol without IV contrast. COMPARISON:  03/13/2017 FINDINGS: Lower chest: Atelectasis in the lung bases. Coronary artery calcifications. Hepatobiliary: No focal liver abnormality is seen. No gallstones, gallbladder wall thickening, or biliary dilatation. Pancreas: Unremarkable. No pancreatic ductal dilatation or surrounding inflammatory changes. Spleen: Normal in size without focal abnormality. Adrenals/Urinary Tract: Adrenal glands are unremarkable. Kidneys are normal, without renal calculi, focal lesion, or hydronephrosis. Bladder is unremarkable. Stomach/Bowel: Stomach, small bowel, and colon are not abnormally distended. No wall thickening is appreciated although under distention limits evaluation. No inflammatory infiltration. Scattered diverticula in the colon. No evidence of diverticulitis. Appendix is normal. Vascular/Lymphatic: No significant vascular findings are present. No enlarged abdominal or pelvic lymph nodes. Reproductive: Prostate gland is not enlarged. Other: No free air or free fluid in the abdomen. Abdominal wall musculature appears  intact. Postoperative changes in the left inguinal region suggesting previous hernia repair. There patchy areas of infiltration and emphysema in the subcutaneous fat of the anterior abdominal wall. These likely represent subcutaneous injection sites. Surface contusion or laceration could also have this appearance in the appropriate clinical setting. Musculoskeletal: Degenerative changes in the lumbar spine. No destructive bone lesions. IMPRESSION: 1. No evidence of bowel obstruction or inflammation. 2. Atelectasis in the lung bases. 3. Coronary artery calcifications. 4. Patchy areas of infiltration and emphysema in the subcutaneous fat of the anterior abdominal wall, likely representing subcutaneous injection sites. Electronically Signed   By: Burman Nieves M.D.   On: 04/27/2018 01:30   Ct Head Wo Contrast  Result Date: 04/27/2018 CLINICAL DATA:  Altered level of consciousness. EXAM: CT HEAD WITHOUT CONTRAST TECHNIQUE: Contiguous axial images were obtained from the base of the skull through the vertex without intravenous contrast. COMPARISON:  CT head 07/31/2017.  MRI brain 05/15/2017. FINDINGS: Brain: No evidence of acute infarction, hemorrhage, hydrocephalus, extra-axial collection or mass lesion/mass effect. Mild diffuse cerebral atrophy. Vascular: No  hyperdense vessel or unexpected calcification. Skull: Normal. Negative for fracture or focal lesion. Sinuses/Orbits: No acute finding. Other: None. IMPRESSION: No acute intracranial abnormalities.  Mild cerebral atrophy. Electronically Signed   By: Burman Nieves M.D.   On: 04/27/2018 01:25   Dg Chest Portable 1 View  Result Date: 04/26/2018 CLINICAL DATA:  Unresponsive, abdominal and leg pain EXAM: PORTABLE CHEST 1 VIEW COMPARISON:  Portable exam 2331 hours compared to 03/12/2017 FINDINGS: External pacing leads project over chest. Normal heart size, mediastinal contours, and pulmonary vascularity. Low lung volumes with bibasilar atelectasis. No definite  infiltrate, pleural effusion or pneumothorax. Cervical spine fusion. IMPRESSION: Low lung volumes with bibasilar atelectasis. Electronically Signed   By: Ulyses Southward M.D.   On: 04/26/2018 23:43   Dg Abd Portable 1 View  Result Date: 04/27/2018 CLINICAL DATA:  NG tube placement EXAM: PORTABLE ABDOMEN - 1 VIEW COMPARISON:  04/26/2018 FINDINGS: Elevation of the left diaphragm with atelectasis at the left base. Esophageal tube tip overlies the gastric body. Mild diffuse increased bowel gas. IMPRESSION: Esophageal tube tip overlies the gastric body Electronically Signed   By: Jasmine Pang M.D.   On: 04/27/2018 03:15   Dg Abd Portable 1 View  Result Date: 04/26/2018 CLINICAL DATA:  Abdominal pain, unresponsive EXAM: PORTABLE ABDOMEN - 1 VIEW COMPARISON:  Portable exam 2331 hours compared to CT abdomen and pelvis of 03/13/2017 FINDINGS: Gaseous distention of stomach. Large and small bowel gas pattern normal. No bowel dilatation or bowel wall thickening. Degenerative changes at lower lumbar spine. IMPRESSION: Gaseous distention of stomach. Electronically Signed   By: Ulyses Southward M.D.   On: 04/26/2018 23:44    Procedures Procedures (including critical care time)  Medications Ordered in ED Medications - No data to display   Initial Impression / Assessment and Plan / ED Course  I have reviewed the triage vital signs and the nursing notes.  Pertinent labs & imaging results that were available during my care of the patient were reviewed by me and considered in my medical decision making (see chart for details).    Patient arrives after being found unresponsive by EMS.  He reportedly complained of abdominal pain and pain rating down his left leg that came on suddenly.  He was briefly paced by EMS but is awake and alert with stable blood pressure on arrival.  He denies any chest pain or shortness of breath.  EKG shows no acute ST changes.  Patient complains of diffuse abdominal pain and is tender  throughout.  Also complains of posterior left thigh pain. Distal pulses are intact.  Bedside ultrasound does not show any free fluid. Portable x-ray does not show any obvious obstruction or free air.  Does show distended stomach.  Work-up shows lactic acidosis with metabolic acidosis and low bicarb.  Patient given fluid resuscitation, IV antibiotics and IV bicarb. Labs show AKI.  CT scan with contrast is canceled. Doubt aortic dissection with stable BP, no chest pain or back pain. Intact distal pulses. Patient denies any chest pain or back pain.   CT scan is negative for obvious pathology or free air or bowel obstruction.  D/w Dr. Sheliah Hatch of surgery concern for mesenteric ischemia despite negative imaging with metabolic acidosis and ongoing abdominal pain with guarding.  He recommends NG tube decompression and will see patient.  Patient given broad-spectrum antibiotics empirically.  Throughout ED visit patient remained stable for several hours with normal heart rate and blood pressure.  His abdominal pain improved with NG tube placement.  Patient was discussed with hospitalist service.  4 AM.  Patient with acute onset of bradycardia to the 40s.  Repeat EKG shows complete heart block.  Patient becomes less responsive and mental status dropped and blood pressure dropped.  Patient was given atropine and pacer pads return on briefly.  Patient responded quickly and became sinus rhythm and blood pressure improved to 120 systolic.  Mental status improved as well.  Denies chest pain or shortness of breath.  Admission will be discussed with ICU.  Question whether patient's intermittent complete heart block is causing mesenteric ischemia  and explains his abdominal pain.  Discussed with Dr. Santiago Glad of cardiology.  He will see patient.  No indication for emergent pacemaker at this time.  There are no beta-blocker or calcium channel blocker drugs to discontinue.  Unclear etiology of patient's  intermittent complete heart block.  Suspect his complete heart block is causing mesenteric ischemia and his abdominal pain.  Patient is protecting his airway with stable blood pressure and heart rate.  Discussed with critical care Dr. Ardeth Perfect.   CRITICAL CARE Performed by: Glynn Octave Total critical care time: 120 minutes Critical care time was exclusive of separately billable procedures and treating other patients. Critical care was necessary to treat or prevent imminent or life-threatening deterioration. Critical care was time spent personally by me on the following activities: development of treatment plan with patient and/or surrogate as well as nursing, discussions with consultants, evaluation of patient's response to treatment, examination of patient, obtaining history from patient or surrogate, ordering and performing treatments and interventions, ordering and review of laboratory studies, ordering and review of radiographic studies, pulse oximetry and re-evaluation of patient's condition.   Final Clinical Impressions(s) / ED Diagnoses   Final diagnoses:  Complete heart block (HCC)  Lactic acidosis  Mesenteric ischemia (HCC)  AKI (acute kidney injury) [facility])    ED Discharge Orders    None       Glynn Octave, MD 04/27/18 [phone]

## 2018-04-27 ENCOUNTER — Emergency Department (HOSPITAL_COMMUNITY): Payer: Medicaid Other

## 2018-04-27 ENCOUNTER — Other Ambulatory Visit: Payer: Self-pay

## 2018-04-27 ENCOUNTER — Encounter (HOSPITAL_COMMUNITY): Payer: Self-pay

## 2018-04-27 DIAGNOSIS — Z7901 Long term (current) use of anticoagulants: Secondary | ICD-10-CM

## 2018-04-27 DIAGNOSIS — I2510 Atherosclerotic heart disease of native coronary artery without angina pectoris: Secondary | ICD-10-CM | POA: Diagnosis present

## 2018-04-27 DIAGNOSIS — Z79899 Other long term (current) drug therapy: Secondary | ICD-10-CM | POA: Diagnosis not present

## 2018-04-27 DIAGNOSIS — N179 Acute kidney failure, unspecified: Secondary | ICD-10-CM

## 2018-04-27 DIAGNOSIS — I25119 Atherosclerotic heart disease of native coronary artery with unspecified angina pectoris: Secondary | ICD-10-CM | POA: Diagnosis not present

## 2018-04-27 DIAGNOSIS — E7800 Pure hypercholesterolemia, unspecified: Secondary | ICD-10-CM | POA: Diagnosis present

## 2018-04-27 DIAGNOSIS — Z955 Presence of coronary angioplasty implant and graft: Secondary | ICD-10-CM | POA: Diagnosis not present

## 2018-04-27 DIAGNOSIS — I129 Hypertensive chronic kidney disease with stage 1 through stage 4 chronic kidney disease, or unspecified chronic kidney disease: Secondary | ICD-10-CM | POA: Diagnosis present

## 2018-04-27 DIAGNOSIS — E872 Acidosis, unspecified: Secondary | ICD-10-CM | POA: Diagnosis present

## 2018-04-27 DIAGNOSIS — I252 Old myocardial infarction: Secondary | ICD-10-CM | POA: Diagnosis not present

## 2018-04-27 DIAGNOSIS — M109 Gout, unspecified: Secondary | ICD-10-CM | POA: Diagnosis present

## 2018-04-27 DIAGNOSIS — R109 Unspecified abdominal pain: Secondary | ICD-10-CM | POA: Diagnosis present

## 2018-04-27 DIAGNOSIS — I442 Atrioventricular block, complete: Secondary | ICD-10-CM | POA: Diagnosis present

## 2018-04-27 DIAGNOSIS — Z86711 Personal history of pulmonary embolism: Secondary | ICD-10-CM | POA: Diagnosis not present

## 2018-04-27 DIAGNOSIS — I5030 Unspecified diastolic (congestive) heart failure: Secondary | ICD-10-CM | POA: Diagnosis not present

## 2018-04-27 DIAGNOSIS — K219 Gastro-esophageal reflux disease without esophagitis: Secondary | ICD-10-CM | POA: Diagnosis present

## 2018-04-27 DIAGNOSIS — I200 Unstable angina: Secondary | ICD-10-CM | POA: Diagnosis not present

## 2018-04-27 DIAGNOSIS — R55 Syncope and collapse: Secondary | ICD-10-CM

## 2018-04-27 DIAGNOSIS — J982 Interstitial emphysema: Secondary | ICD-10-CM | POA: Diagnosis present

## 2018-04-27 DIAGNOSIS — Z8673 Personal history of transient ischemic attack (TIA), and cerebral infarction without residual deficits: Secondary | ICD-10-CM | POA: Diagnosis not present

## 2018-04-27 DIAGNOSIS — G40909 Epilepsy, unspecified, not intractable, without status epilepticus: Secondary | ICD-10-CM | POA: Diagnosis present

## 2018-04-27 DIAGNOSIS — I25118 Atherosclerotic heart disease of native coronary artery with other forms of angina pectoris: Secondary | ICD-10-CM | POA: Diagnosis not present

## 2018-04-27 DIAGNOSIS — F319 Bipolar disorder, unspecified: Secondary | ICD-10-CM | POA: Diagnosis present

## 2018-04-27 DIAGNOSIS — R1084 Generalized abdominal pain: Secondary | ICD-10-CM

## 2018-04-27 DIAGNOSIS — K559 Vascular disorder of intestine, unspecified: Secondary | ICD-10-CM | POA: Diagnosis not present

## 2018-04-27 DIAGNOSIS — K5730 Diverticulosis of large intestine without perforation or abscess without bleeding: Secondary | ICD-10-CM | POA: Diagnosis present

## 2018-04-27 DIAGNOSIS — E1122 Type 2 diabetes mellitus with diabetic chronic kidney disease: Secondary | ICD-10-CM | POA: Diagnosis present

## 2018-04-27 DIAGNOSIS — Z981 Arthrodesis status: Secondary | ICD-10-CM | POA: Diagnosis not present

## 2018-04-27 HISTORY — DX: Long term (current) use of anticoagulants: Z79.01

## 2018-04-27 HISTORY — DX: Acute kidney failure, unspecified: N17.9

## 2018-04-27 LAB — RAPID URINE DRUG SCREEN, HOSP PERFORMED
AMPHETAMINES: NOT DETECTED
BENZODIAZEPINES: NOT DETECTED
Cocaine: NOT DETECTED
Opiates: NOT DETECTED
TETRAHYDROCANNABINOL: NOT DETECTED

## 2018-04-27 LAB — I-STAT ARTERIAL BLOOD GAS, ED
ACID-BASE DEFICIT: 11 mmol/L — AB (ref 0.0–2.0)
BICARBONATE: 16.1 mmol/L — AB (ref 20.0–28.0)
O2 SAT: 96 %
PO2 ART: 96 mmHg (ref 83.0–108.0)
TCO2: 17 mmol/L — ABNORMAL LOW (ref 22–32)
pCO2 arterial: 37.5 mmHg (ref 32.0–48.0)
pH, Arterial: 7.239 — ABNORMAL LOW (ref 7.350–7.450)

## 2018-04-27 LAB — POCT I-STAT 3, ART BLOOD GAS (G3+)
Acid-base deficit: 6 mmol/L — ABNORMAL HIGH (ref 0.0–2.0)
Bicarbonate: 20.2 mmol/L (ref 20.0–28.0)
O2 Saturation: 89 %
PCO2 ART: 40.9 mmHg (ref 32.0–48.0)
TCO2: 21 mmol/L — ABNORMAL LOW (ref 22–32)
pH, Arterial: 7.298 — ABNORMAL LOW (ref 7.350–7.450)
pO2, Arterial: 60 mmHg — ABNORMAL LOW (ref 83.0–108.0)

## 2018-04-27 LAB — COMPREHENSIVE METABOLIC PANEL
ALK PHOS: 42 U/L (ref 38–126)
ALT: 24 U/L (ref 0–44)
ANION GAP: 12 (ref 5–15)
AST: 23 U/L (ref 15–41)
Albumin: 3.5 g/dL (ref 3.5–5.0)
BILIRUBIN TOTAL: 0.5 mg/dL (ref 0.3–1.2)
BUN: 35 mg/dL — ABNORMAL HIGH (ref 8–23)
CO2: 13 mmol/L — AB (ref 22–32)
CREATININE: 2.84 mg/dL — AB (ref 0.61–1.24)
Calcium: 8 mg/dL — ABNORMAL LOW (ref 8.9–10.3)
Chloride: 112 mmol/L — ABNORMAL HIGH (ref 98–111)
GFR calc non Af Amer: 22 mL/min — ABNORMAL LOW (ref >60)
GFR, EST AFRICAN AMERICAN: 26 mL/min — AB (ref >60)
Glucose, Bld: 113 mg/dL — ABNORMAL HIGH (ref 70–99)
Potassium: 3.8 mmol/L (ref 3.5–5.1)
Sodium: 137 mmol/L (ref 135–145)
Total Protein: 6.1 g/dL — ABNORMAL LOW (ref 6.5–8.1)

## 2018-04-27 LAB — BASIC METABOLIC PANEL
Anion gap: 7 (ref 5–15)
BUN: 29 mg/dL — AB (ref 8–23)
CALCIUM: 7.7 mg/dL — AB (ref 8.9–10.3)
CO2: 22 mmol/L (ref 22–32)
CREATININE: 2.03 mg/dL — AB (ref 0.61–1.24)
Chloride: 113 mmol/L — ABNORMAL HIGH (ref 98–111)
GFR calc non Af Amer: 33 mL/min — ABNORMAL LOW (ref >60)
GFR, EST AFRICAN AMERICAN: 38 mL/min — AB (ref >60)
Glucose, Bld: 123 mg/dL — ABNORMAL HIGH (ref 70–99)
Potassium: 4 mmol/L (ref 3.5–5.1)
Sodium: 142 mmol/L (ref 135–145)

## 2018-04-27 LAB — I-STAT CG4 LACTIC ACID, ED: Lactic Acid, Venous: 1.25 mmol/L (ref 0.5–1.9)

## 2018-04-27 LAB — URINALYSIS, ROUTINE W REFLEX MICROSCOPIC
BILIRUBIN URINE: NEGATIVE
Glucose, UA: NEGATIVE mg/dL
HGB URINE DIPSTICK: NEGATIVE
KETONES UR: NEGATIVE mg/dL
Leukocytes, UA: NEGATIVE
Nitrite: NEGATIVE
Protein, ur: NEGATIVE mg/dL
SPECIFIC GRAVITY, URINE: 1.009 (ref 1.005–1.030)
pH: 5 (ref 5.0–8.0)

## 2018-04-27 LAB — TROPONIN I

## 2018-04-27 LAB — PROCALCITONIN: Procalcitonin: 0.11 ng/mL

## 2018-04-27 LAB — HEPATIC FUNCTION PANEL
ALT: 22 U/L (ref 0–44)
AST: 22 U/L (ref 15–41)
Albumin: 3.2 g/dL — ABNORMAL LOW (ref 3.5–5.0)
Alkaline Phosphatase: 42 U/L (ref 38–126)
Bilirubin, Direct: 0.1 mg/dL (ref 0.0–0.2)
Indirect Bilirubin: 0.4 mg/dL (ref 0.3–0.9)
TOTAL PROTEIN: 5.8 g/dL — AB (ref 6.5–8.1)
Total Bilirubin: 0.5 mg/dL (ref 0.3–1.2)

## 2018-04-27 LAB — TYPE AND SCREEN
ABO/RH(D): B POS
ANTIBODY SCREEN: NEGATIVE

## 2018-04-27 LAB — VALPROIC ACID LEVEL: Valproic Acid Lvl: 17 ug/mL — ABNORMAL LOW (ref 50.0–100.0)

## 2018-04-27 LAB — TSH: TSH: 1.057 u[IU]/mL (ref 0.350–4.500)

## 2018-04-27 LAB — GLUCOSE, CAPILLARY
GLUCOSE-CAPILLARY: 107 mg/dL — AB (ref 70–99)
Glucose-Capillary: 120 mg/dL — ABNORMAL HIGH (ref 70–99)
Glucose-Capillary: 112 mg/dL — ABNORMAL HIGH (ref 70–99)
Glucose-Capillary: 124 mg/dL — ABNORMAL HIGH (ref 70–99)
Glucose-Capillary: 123 mg/dL — ABNORMAL HIGH (ref 70–99)

## 2018-04-27 LAB — MAGNESIUM
MAGNESIUM: 2 mg/dL (ref 1.7–2.4)
MAGNESIUM: 2.2 mg/dL (ref 1.7–2.4)

## 2018-04-27 LAB — CBC
HCT: 41.5 % (ref 39.0–52.0)
Hemoglobin: 13.1 g/dL (ref 13.0–17.0)
MCH: 29.6 pg (ref 26.0–34.0)
MCHC: 31.6 g/dL (ref 30.0–36.0)
MCV: 93.7 fL (ref 78.0–100.0)
Platelets: 178 10*3/uL (ref 150–400)
RBC: 4.43 MIL/uL (ref 4.22–5.81)
RDW: 14 % (ref 11.5–15.5)
WBC: 4.9 10*3/uL (ref 4.0–10.5)

## 2018-04-27 LAB — SALICYLATE LEVEL

## 2018-04-27 LAB — ETHANOL: Alcohol, Ethyl (B): <10 mg/dL (ref <10)

## 2018-04-27 LAB — PHOSPHORUS: PHOSPHORUS: 4.3 mg/dL (ref 2.5–4.6)

## 2018-04-27 LAB — MRSA PCR SCREENING: MRSA by PCR: NEGATIVE

## 2018-04-27 LAB — HIV ANTIBODY (ROUTINE TESTING W REFLEX): HIV SCREEN 4TH GENERATION: NONREACTIVE

## 2018-04-27 LAB — CK: Total CK: 544 U/L — ABNORMAL HIGH (ref 49–397)

## 2018-04-27 LAB — ACETAMINOPHEN LEVEL: Acetaminophen (Tylenol), Serum: <10 ug/mL — ABNORMAL LOW (ref 10–30)

## 2018-04-27 MED ORDER — OXYCODONE HCL 5 MG PO TABS
7.5000 mg | ORAL_TABLET | Freq: Three times a day (TID) | ORAL | Status: DC
  Administered 2018-04-27 – 2018-05-01 (×13): 7.5 mg via ORAL
  Filled 2018-04-27 (×13): qty 2

## 2018-04-27 MED ORDER — DIVALPROEX SODIUM 250 MG PO DR TAB
250.0000 mg | DELAYED_RELEASE_TABLET | Freq: Three times a day (TID) | ORAL | Status: DC
Start: 2018-04-27 — End: 2018-05-01
  Administered 2018-04-27 – 2018-05-01 (×13): 250 mg via ORAL
  Filled 2018-04-27 (×14): qty 1

## 2018-04-27 MED ORDER — ALLOPURINOL 300 MG PO TABS
300.0000 mg | ORAL_TABLET | Freq: Every day | ORAL | Status: DC
Start: 2018-04-27 — End: 2018-05-01
  Administered 2018-04-27 – 2018-05-01 (×5): 300 mg via ORAL
  Filled 2018-04-27 (×5): qty 1

## 2018-04-27 MED ORDER — SODIUM BICARBONATE 8.4 % IV SOLN
100.0000 meq | Freq: Once | INTRAVENOUS | Status: AC
  Administered 2018-04-27: 100 meq via INTRAVENOUS
  Filled 2018-04-27: qty 50

## 2018-04-27 MED ORDER — PIPERACILLIN-TAZOBACTAM 3.375 G IVPB
3.3750 g | Freq: Three times a day (TID) | INTRAVENOUS | Status: DC
  Administered 2018-04-27: 3.375 g via INTRAVENOUS
  Filled 2018-04-27: qty 50

## 2018-04-27 MED ORDER — SODIUM CHLORIDE 0.9 % IV BOLUS (SEPSIS)
1000.0000 mL | Freq: Once | INTRAVENOUS | Status: AC
  Administered 2018-04-27: 1000 mL via INTRAVENOUS

## 2018-04-27 MED ORDER — SODIUM BICARBONATE 8.4 % IV SOLN
INTRAVENOUS | Status: AC
  Filled 2018-04-27: qty 50

## 2018-04-27 MED ORDER — LACTATED RINGERS IV SOLN
INTRAVENOUS | Status: DC
  Administered 2018-04-27 – 2018-04-28 (×2): via INTRAVENOUS

## 2018-04-27 MED ORDER — FENTANYL CITRATE (PF) 100 MCG/2ML IJ SOLN
50.0000 ug | Freq: Once | INTRAMUSCULAR | Status: AC
Start: 2018-04-27 — End: 2018-04-27
  Administered 2018-04-27: 50 ug via INTRAVENOUS
  Filled 2018-04-27: qty 2

## 2018-04-27 MED ORDER — ATROPINE SULFATE 1 MG/ML IJ SOLN
INTRAMUSCULAR | Status: AC
  Filled 2018-04-27: qty 1

## 2018-04-27 MED ORDER — LOSARTAN POTASSIUM 50 MG PO TABS
100.0000 mg | ORAL_TABLET | Freq: Every day | ORAL | Status: DC
  Administered 2018-04-27 – 2018-04-28 (×2): 100 mg via ORAL
  Filled 2018-04-27 (×2): qty 2

## 2018-04-27 MED ORDER — GABAPENTIN 400 MG PO CAPS: 400.0000 mg | ORAL_CAPSULE | Freq: Two times a day (BID) | ORAL | Status: DC

## 2018-04-27 MED ORDER — ADULT MULTIVITAMIN W/MINERALS CH
1.0000 | ORAL_TABLET | Freq: Every day | ORAL | Status: DC
  Administered 2018-04-27 – 2018-05-01 (×5): 1 via ORAL
  Filled 2018-04-27 (×5): qty 1

## 2018-04-27 MED ORDER — INSULIN ASPART 100 UNIT/ML ~~LOC~~ SOLN
0.0000 [IU] | SUBCUTANEOUS | Status: DC
  Administered 2018-04-27 – 2018-04-30 (×3): 2 [IU] via SUBCUTANEOUS

## 2018-04-27 MED ORDER — HEPARIN SODIUM (PORCINE) 5000 UNIT/ML IJ SOLN
5000.0000 [IU] | Freq: Three times a day (TID) | INTRAMUSCULAR | Status: DC
  Administered 2018-04-27 – 2018-04-28 (×3): 5000 [IU] via SUBCUTANEOUS
  Filled 2018-04-27 (×4): qty 1

## 2018-04-27 MED ORDER — PANTOPRAZOLE SODIUM 40 MG PO TBEC
40.0000 mg | DELAYED_RELEASE_TABLET | Freq: Every day | ORAL | Status: DC
  Administered 2018-04-27 – 2018-04-28 (×2): 40 mg via ORAL
  Filled 2018-04-27 (×2): qty 1

## 2018-04-27 MED ORDER — VANCOMYCIN HCL IN DEXTROSE 1-5 GM/200ML-% IV SOLN
1000.0000 mg | INTRAVENOUS | Status: DC
  Administered 2018-04-27: 1000 mg via INTRAVENOUS
  Filled 2018-04-27: qty 200

## 2018-04-27 MED ORDER — SODIUM CHLORIDE 0.9 % IV SOLN: 1000.0000 mL | INTRAVENOUS | Status: DC

## 2018-04-27 MED ORDER — SODIUM CHLORIDE 0.9 % IV SOLN: 250.0000 mL | INTRAVENOUS | Status: DC | PRN

## 2018-04-27 MED ORDER — WARFARIN - PHARMACIST DOSING INPATIENT: Freq: Every day | Status: DC

## 2018-04-27 MED ORDER — ROSUVASTATIN CALCIUM 40 MG PO TABS
40.0000 mg | ORAL_TABLET | Freq: Every day | ORAL | Status: DC
Start: 2018-04-27 — End: 2018-05-01
  Administered 2018-04-27 – 2018-04-30 (×4): 40 mg via ORAL
  Filled 2018-04-27 (×4): qty 1

## 2018-04-27 MED ORDER — WARFARIN SODIUM 5 MG PO TABS
5.0000 mg | ORAL_TABLET | Freq: Once | ORAL | Status: AC
Start: 2018-04-27 — End: 2018-04-27
  Administered 2018-04-27: 5 mg via ORAL
  Filled 2018-04-27: qty 1

## 2018-04-27 MED ORDER — SODIUM CHLORIDE 0.9 % IV SOLN
1000.0000 mL | INTRAVENOUS | Status: DC
  Administered 2018-04-27 (×3): 1000 mL via INTRAVENOUS

## 2018-04-27 MED ORDER — DULOXETINE HCL 30 MG PO CPEP
30.0000 mg | ORAL_CAPSULE | Freq: Two times a day (BID) | ORAL | Status: DC
  Filled 2018-04-27: qty 1

## 2018-04-27 NOTE — Progress Notes (Signed)
Pharmacy Antibiotic Note  Russell Carter is a 62 y.o. male admitted on 04/26/2018 with cellulitis and intra-abdominal infection.  Pharmacy has been consulted for vancomycin and Zosyn dosing.  Patient was complaining of abdominal and leg pain that came on suddenly PTA. Appears to have AKI as SCr was 1.15 in May and is now up to 2.9, eCrCl ~18mL/min.  Plan: Zosyn 3.375g IV q8h EI Vancomycin 1000mg  IV q24h Follow c/s, clinical progression, renal function, level PRN   Height: 5\' 11"  (180.3 cm) Weight: 211 lb (95.7 kg) IBW/kg (Calculated) : 75.3  Temp (24hrs), Avg:97.9 F (36.6 C), Min:97.9 F (36.6 C), Max:97.9 F (36.6 C)  Recent Labs  Lab 04/26/18 2326 04/26/18 2345  WBC 6.1  --   CREATININE  --  2.90*  LATICACIDVEN  --  3.60*    Estimated Creatinine Clearance: 30.8 mL/min (A) (by C-G formula based on SCr of 2.9 mg/dL (H)).    Allergies  Allergen Reactions  . Shellfish Allergy Anaphylaxis    Antimicrobials this admission: Vancomycin 7/7 >>  Zosyn 7/7 >>   Dose adjustments this admission: n/a  Microbiology results: none  Thank you for allowing pharmacy to be a part of this patient's care.  Jihan Rudy D. Aliciana Ricciardi, PharmD, BCPS Clinical Pharmacist [phone] Please check AMION for all [facility] Pharmacy numbers 04/27/2018 12:28 AM

## 2018-04-27 NOTE — Consult Note (Signed)
Reason for Consult: lactic acidosis Referring Physician: [name] Carter is an 62 y.o. male.  HPI: 62 yo male found to be unresponsive requiring external pacing in the field. He became more aware in the emergency department and responds to painful and verbal stimuli. He states he started having abdominal pain and left leg pain 3 days ago with some nausea but no vomiting.  Past Medical History:  Diagnosis Date  . Anginal pain ([place])   . Anxiety   . Arthritis   . Bipolar disorder ([place])   . Blood in stool   . CAD (coronary artery disease)    a. 2011: cath showing mod non-obstructive disease b. 02/2016: cath with 95% stenosis in the Mid RCA. DES placed.  . Cervical stenosis of spine   . Cocaine abuse ([place])    history  . Depression   . Diabetes mellitus without complication ([place])   . Gout, unspecified   . Headache   . HTN (hypertension)   . Hypercholesterolemia   . Hyperglycemia   . Migraines   . Myocardial infarction [facility]) 2008   drug cocaine and marijuana at time of mi none  since    . Pneumonia   . Rectal bleeding   . Renal insufficiency   . Seizures ([place])   . Stroke [facility])     Past Surgical History:  Procedure Laterality Date  . ANTERIOR CERVICAL DECOMP/DISCECTOMY FUSION N/A 01/24/2017   Procedure: Cervical Four-Five,Cervical Five Six,Cervical Six-Seven Anterior cervical discectomy with fusion and plate with Cervical Three-Thoracic- One Laminectomy/Foraminotomy with Cervical Three-Thoracic- One Fixation and fusion;  Surgeon: Kevan Ny Ditty, MD;  Location: [place];  Service: Neurosurgery;  Laterality: N/A;  Part-1 anterior approach  . CARDIAC CATHETERIZATION  2008; ~ 2011   [place]-40%lad  . CARDIAC CATHETERIZATION N/A 03/16/2016   Procedure: Left Heart Cath and Coronary Angiography;  Surgeon: Leonie Man, MD;  Location: [place] CV LAB;  Service: Cardiovascular;  Laterality: N/A;  . COLONOSCOPY WITH PROPOFOL N/A 04/17/2018   Procedure: COLONOSCOPY WITH  PROPOFOL;  Surgeon: Wonda Horner, MD;  Location: [facility] ENDOSCOPY;  Service: Endoscopy;  Laterality: N/A;  . CORONARY ANGIOPLASTY     STENTS  . EYE SURGERY Bilateral    "for double vision"  . INGUINAL HERNIA REPAIR  05/12/2012   Procedure: HERNIA REPAIR INGUINAL ADULT;  Surgeon: Zenovia Jarred, MD;  Location: [place];  Service: General;  Laterality: Right;  . INGUINAL HERNIA REPAIR Left 02/02/2003   Dr. Georganna Skeans. repair with mesh. Same procedure done again in  05/22/2004   . INGUINAL HERNIA REPAIR Left ?date 2nd OR  . POSTERIOR CERVICAL FUSION/FORAMINOTOMY N/A 01/24/2017   Procedure: Cervical Three-Thoracic One-Laminectomy/Foraminotomy with Cervical Three-Thoracic One Fixation and fusion;  Surgeon: Kevan Ny Ditty, MD;  Location: [place];  Service: Neurosurgery;  Laterality: N/A;  Part-2 Posterior approach  . RECONSTRUCT / STABILIZE DISTAL ULNA    . SHOULDER OPEN ROTATOR CUFF REPAIR Left 2010  . SHOULDER SURGERY Left    "injured on the job; had to cut small bone out"  . THUMB FUSION Right     Family History  Problem Relation Age of Onset  . Heart attack Mother        x7. Still alive  . Heart attack Father        deceased b/c of MI  . Heart attack Brother        older, deceased b/c of MI   . Cancer Brother  bone cancer in 1 brother, ? brain cancer in other brother    Social History:  reports that he has never smoked. He has never used smokeless tobacco. He reports that he drinks alcohol. He reports that he has current or past drug history. Drugs: "Crack" cocaine, Marijuana, and Cocaine.  Allergies:  Allergies  Allergen Reactions  . Shellfish Allergy Anaphylaxis    Medications: I have reviewed the patient's current medications.  Results for orders placed or performed during the hospital encounter of 04/26/18 (from the past 48 hour(s))  CBC with Differential/Platelet     Status: None   Collection Time: 04/26/18 11:26 PM  Result Value Ref Range    WBC 6.1 4.0 - 10.5 K/uL   RBC 4.63 4.22 - 5.81 MIL/uL   Hemoglobin 13.8 13.0 - 17.0 g/dL   HCT 43.7 39.0 - 52.0 %   MCV 94.4 78.0 - 100.0 fL   MCH 29.8 26.0 - 34.0 pg   MCHC 31.6 30.0 - 36.0 g/dL   RDW 14.2 11.5 - 15.5 %   Platelets 197 150 - 400 K/uL   Neutrophils Relative % 42 %   Neutro Abs 2.5 1.7 - 7.7 K/uL   Lymphocytes Relative 44 %   Lymphs Abs 2.7 0.7 - 4.0 K/uL   Monocytes Relative 13 %   Monocytes Absolute 0.8 0.1 - 1.0 K/uL   Eosinophils Relative 1 %   Eosinophils Absolute 0.0 0.0 - 0.7 K/uL   Basophils Relative 0 %   Basophils Absolute 0.0 0.0 - 0.1 K/uL   Immature Granulocytes 0 %   Abs Immature Granulocytes 0.0 0.0 - 0.1 K/uL    Comment: Performed at [facility], [address], [place], [place] [address]  Comprehensive metabolic panel     Status: Abnormal   Collection Time: 04/26/18 11:26 PM  Result Value Ref Range   Sodium 137 135 - 145 mmol/L   Potassium 3.8 3.5 - 5.1 mmol/L   Chloride 112 (H) 98 - 111 mmol/L    Comment: Please note change in reference range.   CO2 13 (L) 22 - 32 mmol/L   Glucose, Bld 113 (H) 70 - 99 mg/dL    Comment: Please note change in reference range.   BUN 35 (H) 8 - 23 mg/dL    Comment: Please note change in reference range.   Creatinine, Ser 2.84 (H) 0.61 - 1.24 mg/dL   Calcium 8.0 (L) 8.9 - 10.3 mg/dL   Total Protein 6.1 (L) 6.5 - 8.1 g/dL   Albumin 3.5 3.5 - 5.0 g/dL   AST 23 15 - 41 U/L   ALT 24 0 - 44 U/L    Comment: Please note change in reference range.   Alkaline Phosphatase 42 38 - 126 U/L   Total Bilirubin 0.5 0.3 - 1.2 mg/dL   GFR calc non Af Amer 22 (L) >60 mL/min   GFR calc Af Amer 26 (L) >60 mL/min    Comment: (NOTE) The eGFR has been calculated using the CKD EPI equation. This calculation has not been validated in all clinical situations. eGFR's persistently <60 mL/min signify possible Chronic Kidney Disease.    Anion gap 12 5 - 15    Comment: Performed at [facility] [address],  [place], [place] [address]  Protime-INR     Status: Abnormal   Collection Time: 04/26/18 11:26 PM  Result Value Ref Range   Prothrombin Time 22.3 (H) 11.4 - 15.2 seconds   INR 1.98  Comment: Performed at [facility], [place] [address], [place], [place] [address]  Troponin I     Status: None   Collection Time: 04/26/18 11:26 PM  Result Value Ref Range   Troponin I <0.03 <0.03 ng/mL    Comment: Performed at [facility] [address], [place], [place] [address]  CK     Status: Abnormal   Collection Time: 04/26/18 11:26 PM  Result Value Ref Range   Total CK 544 (H) 49 - 397 U/L    Comment: Performed at [facility], [place] [address] [place], [place] [address]  POC occult blood, ED     Status: None   Collection Time: 04/26/18 11:34 PM  Result Value Ref Range   Fecal Occult Bld NEGATIVE NEGATIVE  I-Stat Troponin, ED (not at [facility])     Status: None   Collection Time: 04/26/18 11:43 PM  Result Value Ref Range   Troponin i, poc 0.01 0.00 - 0.08 ng/mL   Comment 3            Comment: Due to the release kinetics of cTnI, a negative result within the first hours of the onset of symptoms does not rule out myocardial infarction with certainty. If myocardial infarction is still suspected, repeat the test at appropriate intervals.   I-Stat Chem 8, ED     Status: Abnormal   Collection Time: 04/26/18 11:45 PM  Result Value Ref Range   Sodium 139 135 - 145 mmol/L   Potassium 3.8 3.5 - 5.1 mmol/L   Chloride 110 98 - 111 mmol/L   BUN 38 (H) 8 - 23 mg/dL   Creatinine, Ser 2.90 (H) 0.61 - 1.24 mg/dL   Glucose, Bld 112 (H) 70 - 99 mg/dL   Calcium, Ion 1.11 (L) 1.15 - 1.40 mmol/L   TCO2 18 (L) 22 - 32 mmol/L   Hemoglobin 14.3 13.0 - 17.0 g/dL   HCT 42.0 39.0 - 52.0 %  I-Stat CG4 Lactic Acid, ED     Status: Abnormal   Collection Time: 04/26/18 11:45 PM  Result Value Ref Range   Lactic Acid, Venous 3.60 (HH) 0.5 - 1.9 mmol/L   Comment NOTIFIED PHYSICIAN   I-Stat Arterial Blood  Gas, ED - (order at [facility] and [facility] only)     Status: Abnormal   Collection Time: 04/27/18 12:05 AM  Result Value Ref Range   pH, Arterial 7.239 (L) 7.350 - 7.450   pCO2 arterial 37.5 32.0 - 48.0 mmHg   pO2, Arterial 96.0 83.0 - 108.0 mmHg   Bicarbonate 16.1 (L) 20.0 - 28.0 mmol/L   TCO2 17 (L) 22 - 32 mmol/L   O2 Saturation 96.0 %   Acid-base deficit 11.0 (H) 0.0 - 2.0 mmol/L   Patient temperature 97.9 F    Sample type ARTERIAL   Urinalysis, Routine w reflex microscopic     Status: Abnormal   Collection Time: 04/27/18  2:48 AM  Result Value Ref Range   Color, Urine YELLOW YELLOW   APPearance HAZY (A) CLEAR   Specific Gravity, Urine 1.009 1.005 - 1.030   pH 5.0 5.0 - 8.0   Glucose, UA NEGATIVE NEGATIVE mg/dL   Hgb urine dipstick NEGATIVE NEGATIVE   Bilirubin Urine NEGATIVE NEGATIVE   Ketones, ur NEGATIVE NEGATIVE mg/dL   Protein, ur NEGATIVE NEGATIVE mg/dL   Nitrite NEGATIVE NEGATIVE   Leukocytes, UA NEGATIVE NEGATIVE    Comment: Performed at [facility], [place] [address], [place],  [address]  Rapid urine drug screen (hospital performed)     Status: Abnormal   Collection Time: 04/27/18  2:49 AM  Result Value Ref Range   Opiates NONE DETECTED NONE DETECTED   Cocaine NONE DETECTED NONE DETECTED   Benzodiazepines NONE DETECTED NONE DETECTED   Amphetamines NONE DETECTED NONE DETECTED   Tetrahydrocannabinol NONE DETECTED NONE DETECTED   Barbiturates (A) NONE DETECTED    Result not available. Reagent lot number recalled by manufacturer.    Comment: Performed at [facility], [place] [address], [place], [place] [address]  Ethanol     Status: None   Collection Time: 04/27/18  3:16 AM  Result Value Ref Range   Alcohol, Ethyl (B) <10 <10 mg/dL    Comment: (NOTE) Lowest detectable limit for serum alcohol is 10 mg/dL. For medical purposes only. Performed at [facility], [place] [address] [place], [place] [address]   Acetaminophen level     Status: Abnormal    Collection Time: 04/27/18  3:16 AM  Result Value Ref Range   Acetaminophen (Tylenol), Serum <10 (L) 10 - 30 ug/mL    Comment: (NOTE) Therapeutic concentrations vary significantly. A range of 10-30 ug/mL  may be an effective concentration for many patients. However, some  are best treated at concentrations outside of this range. Acetaminophen concentrations >150 ug/mL at 4 hours after ingestion  and >50 ug/mL at 12 hours after ingestion are often associated with  toxic reactions. Performed at [facility], [place] [address] [place], [place] [address]   Salicylate level     Status: None   Collection Time: 04/27/18  3:16 AM  Result Value Ref Range   Salicylate Lvl <9.3 2.8 - 30.0 mg/dL    Comment: Performed at [place] [address], [place], [place] [address]  Valproic acid level     Status: Abnormal   Collection Time: 04/27/18  3:16 AM  Result Value Ref Range   Valproic Acid Lvl 17 (L) 50.0 - 100.0 ug/mL    Comment: Performed at [facility] [address], [place], [place] [address]  Magnesium     Status: None   Collection Time: 04/27/18  3:16 AM  Result Value Ref Range   Magnesium 2.0 1.7 - 2.4 mg/dL    Comment: Performed at [facility] [address], [place], [place] [address]  Type and screen [place]     Status: None   Collection Time: 04/27/18  3:30 AM  Result Value Ref Range   ABO/RH(D) B POS    Antibody Screen NEG    Sample Expiration      04/30/2018 Performed at [facility], [place] [address] [place], [place] [address]   I-Stat CG4 Lactic Acid, ED     Status: None   Collection Time: 04/27/18  3:36 AM  Result Value Ref Range   Lactic Acid, Venous 1.25 0.5 - 1.9 mmol/L    Ct Abdomen Pelvis Wo Contrast  Result Date: 04/27/2018 CLINICAL DATA:  Acute generalized abdominal pain. EXAM: CT ABDOMEN AND PELVIS WITHOUT CONTRAST TECHNIQUE: Multidetector CT imaging of the abdomen and pelvis was performed following the  standard protocol without IV contrast. COMPARISON:  03/13/2017 FINDINGS: Lower chest: Atelectasis in the lung bases. Coronary artery calcifications. Hepatobiliary: No focal liver abnormality is seen. No gallstones, gallbladder wall thickening, or biliary dilatation. Pancreas: Unremarkable. No pancreatic ductal dilatation or surrounding inflammatory changes. Spleen: Normal in size without focal abnormality. Adrenals/Urinary Tract:  Adrenal glands are unremarkable. Kidneys are normal, without renal calculi, focal lesion, or hydronephrosis. Bladder is unremarkable. Stomach/Bowel: Stomach, small bowel, and colon are not abnormally distended. No wall thickening is appreciated although under distention limits evaluation. No inflammatory infiltration. Scattered diverticula in the colon. No evidence of diverticulitis. Appendix is normal. Vascular/Lymphatic: No significant vascular findings are present. No enlarged abdominal or pelvic lymph nodes. Reproductive: Prostate gland is not enlarged. Other: No free air or free fluid in the abdomen. Abdominal wall musculature appears intact. Postoperative changes in the left inguinal region suggesting previous hernia repair. There patchy areas of infiltration and emphysema in the subcutaneous fat of the anterior abdominal wall. These likely represent subcutaneous injection sites. Surface contusion or laceration could also have this appearance in the appropriate clinical setting. Musculoskeletal: Degenerative changes in the lumbar spine. No destructive bone lesions. IMPRESSION: 1. No evidence of bowel obstruction or inflammation. 2. Atelectasis in the lung bases. 3. Coronary artery calcifications. 4. Patchy areas of infiltration and emphysema in the subcutaneous fat of the anterior abdominal wall, likely representing subcutaneous injection sites. Electronically Signed   By: Lucienne Capers M.D.   On: 04/27/2018 01:30   Ct Head Wo Contrast  Result Date: 04/27/2018 CLINICAL DATA:   Altered level of consciousness. EXAM: CT HEAD WITHOUT CONTRAST TECHNIQUE: Contiguous axial images were obtained from the base of the skull through the vertex without intravenous contrast. COMPARISON:  CT head 07/31/2017.  MRI brain 05/15/2017. FINDINGS: Brain: No evidence of acute infarction, hemorrhage, hydrocephalus, extra-axial collection or mass lesion/mass effect. Mild diffuse cerebral atrophy. Vascular: No hyperdense vessel or unexpected calcification. Skull: Normal. Negative for fracture or focal lesion. Sinuses/Orbits: No acute finding. Other: None. IMPRESSION: No acute intracranial abnormalities.  Mild cerebral atrophy. Electronically Signed   By: Lucienne Capers M.D.   On: 04/27/2018 01:25   Dg Chest Portable 1 View  Result Date: 04/26/2018 CLINICAL DATA:  Unresponsive, abdominal and leg pain EXAM: PORTABLE CHEST 1 VIEW COMPARISON:  Portable exam 2331 hours compared to 03/12/2017 FINDINGS: External pacing leads project over chest. Normal heart size, mediastinal contours, and pulmonary vascularity. Low lung volumes with bibasilar atelectasis. No definite infiltrate, pleural effusion or pneumothorax. Cervical spine fusion. IMPRESSION: Low lung volumes with bibasilar atelectasis. Electronically Signed   By: Lavonia Dana M.D.   On: 04/26/2018 23:43   Dg Abd Portable 1 View  Result Date: 04/27/2018 CLINICAL DATA:  NG tube placement EXAM: PORTABLE ABDOMEN - 1 VIEW COMPARISON:  04/26/2018 FINDINGS: Elevation of the left diaphragm with atelectasis at the left base. Esophageal tube tip overlies the gastric body. Mild diffuse increased bowel gas. IMPRESSION: Esophageal tube tip overlies the gastric body Electronically Signed   By: Donavan Foil M.D.   On: 04/27/2018 03:15   Dg Abd Portable 1 View  Result Date: 04/26/2018 CLINICAL DATA:  Abdominal pain, unresponsive EXAM: PORTABLE ABDOMEN - 1 VIEW COMPARISON:  Portable exam 2331 hours compared to CT abdomen and pelvis of 03/13/2017 FINDINGS: Gaseous  distention of stomach. Large and small bowel gas pattern normal. No bowel dilatation or bowel wall thickening. Degenerative changes at lower lumbar spine. IMPRESSION: Gaseous distention of stomach. Electronically Signed   By: Lavonia Dana M.D.   On: 04/26/2018 23:44    Review of Systems  Unable to perform ROS: Medical condition   Blood pressure 130/83, pulse 81, temperature 97.9 F (36.6 C), temperature source Oral, resp. rate 14, height _0  (1.803 m), weight 94.7 kg (208 lb 12.4 oz), SpO2 94 %. Physical Exam  Vitals reviewed. Constitutional: He appears well-developed and well-nourished. He appears lethargic.  HENT:  Head: Normocephalic and atraumatic.  Eyes: Pupils are equal, round, and reactive to light. Conjunctivae and EOM are normal.  Neck: Neck supple. No tracheal deviation present. No thyromegaly present.  Cardiovascular: Normal rate and regular rhythm.  Respiratory: Effort normal and breath sounds normal.  GI: Soft. Bowel sounds are normal. He exhibits no distension. There is tenderness in the right lower quadrant and left lower quadrant.  Neurological: He appears lethargic. GCS eye subscore is 2. GCS verbal subscore is 4. GCS motor subscore is 6.  Skin: Skin is warm and dry.  Psychiatric: His speech is delayed. His speech is not rapid and/or pressured and not tangential. He is slowed. He is not actively hallucinating. Cognition and memory are impaired. He is attentive.   Assessment/Plan: 62 yo male found unresponsive now with some response to verbal stimuli. He is still somnolent. He has been found to have AKI with acidosis and complete heart block. Lactic acidosis has now resolved. He had an NG placed with minimal output -CT scan reviewed without evidence for surgical issue -we will signoff now, call if new concern for surgical issue  Russell Carter 04/27/2018, 6:49 AM

## 2018-04-27 NOTE — H&P (Signed)
HISTORY & PHYSICAL  Patient Name: Russell Carter MRN: [id number] DOB: [birth year]    ADMISSION DATE:  04/26/2018 DATE OF SERVICE: 04/27/2018  CHIEF COMPLAINT: Syncope, Loss of consciousness   HISTORY OF PRESENT ILLNESS  This 62 y.o. African-American male reformed smoker presented to the [facility] Emergency Department via [facility] with complaints of loss of consciousness.  The patient's fiance observe the patient lose consciousness and called EMS.  According to ER documentation, the patient was found unresponsive by EMS.  Apparently required external pacing in the field; however on arrival to the emergency department, he was no longer on external pacing support, hypotensive and on nonrebreather mask.  He was responsive only to painful stimuli but improved to be responsive to verbal stimuli.  In the emergency department, the patient was observed to be in complete heart block.  The patient endorses that he did feel nausea and abdominal pain.  He denies any bouts of emesis.  No chest pain.  No dyspnea, orthopnea or paroxysmal nocturnal dyspnea.  The patient has a history of a postoperative DVT (RLE 01/2017, LLE 03/2017) and pulmonary embolism (01/2017) and is on lifelong secondary prophylaxis with Coumadin.  REVIEW OF SYSTEMS As highlighted above in the HPI.  Otherwise, the remainder the balance of the review of 13 systems is negative.  PAST MEDICAL/SURGICAL/SOCIAL/FAMILY HISTORIES   Past Medical History:  Diagnosis Date  . Anginal pain (HCC)   . Anxiety   . Arthritis   . Bipolar disorder (HCC)   . Blood in stool   . CAD (coronary artery disease)    a. 2011: cath showing mod non-obstructive disease b. 02/2016: cath with 95% stenosis in the Mid RCA. DES placed.  . Cervical stenosis of spine   . Cocaine abuse (HCC)    history  . Depression   . Diabetes mellitus without complication (HCC)   . Gout, unspecified   . Headache   . HTN (hypertension)   .  Hypercholesterolemia   . Hyperglycemia   . Migraines   . Myocardial infarction [facility]) 2008   drug cocaine and marijuana at time of mi none  since    . Pneumonia   . Rectal bleeding   . Renal insufficiency   . Seizures (HCC)   . Stroke [facility])     Past Surgical History:  Procedure Laterality Date  . ANTERIOR CERVICAL DECOMP/DISCECTOMY FUSION N/A 01/24/2017   Procedure: Cervical Four-Five,Cervical Five Six,Cervical Six-Seven Anterior cervical discectomy with fusion and plate with Cervical Three-Thoracic- One Laminectomy/Foraminotomy with Cervical Three-Thoracic- One Fixation and fusion;  Surgeon: Loura Halt Ditty, MD;  Location: [facility] OR;  Service: Neurosurgery;  Laterality: N/A;  Part-1 anterior approach  . CARDIAC CATHETERIZATION  2008; ~ 2011   [place]-40%lad  . CARDIAC CATHETERIZATION N/A 03/16/2016   Procedure: Left Heart Cath and Coronary Angiography;  Surgeon: Marykay Lex, MD;  Location: [facility] INVASIVE CV LAB;  Service: Cardiovascular;  Laterality: N/A;  . COLONOSCOPY WITH PROPOFOL N/A 04/17/2018   Procedure: COLONOSCOPY WITH PROPOFOL;  Surgeon: Graylin Shiver, MD;  Location: [facility] ENDOSCOPY;  Service: Endoscopy;  Laterality: N/A;  . CORONARY ANGIOPLASTY     STENTS  . EYE SURGERY Bilateral    "for double vision"  . INGUINAL HERNIA REPAIR  05/12/2012   Procedure: HERNIA REPAIR INGUINAL ADULT;  Surgeon: Liz Malady, MD;  Location: [facility];  Service: General;  Laterality: Right;  . INGUINAL HERNIA REPAIR Left 02/02/2003   Dr. Violeta Gelinas. repair with mesh. Same  procedure done again in  05/22/2004   . INGUINAL HERNIA REPAIR Left ?date 2nd OR  . POSTERIOR CERVICAL FUSION/FORAMINOTOMY N/A 01/24/2017   Procedure: Cervical Three-Thoracic One-Laminectomy/Foraminotomy with Cervical Three-Thoracic One Fixation and fusion;  Surgeon: Loura Halt Ditty, MD;  Location: [facility] OR;  Service: Neurosurgery;  Laterality: N/A;  Part-2 Posterior approach  . RECONSTRUCT / STABILIZE DISTAL  ULNA    . SHOULDER OPEN ROTATOR CUFF REPAIR Left 2010  . SHOULDER SURGERY Left    "injured on the job; had to cut small bone out"  . THUMB FUSION Right     Social History   Tobacco Use  . Smoking status: Never Smoker  . Smokeless tobacco: Never Used  Substance Use Topics  . Alcohol use: Yes    Comment: 16 oz beer every 2-3 days    Family History  Problem Relation Age of Onset  . Heart attack Mother        x7. Still alive  . Heart attack Father        deceased b/c of MI  . Heart attack Brother        older, deceased b/c of MI   . Cancer Brother        bone cancer in 1 brother, ? brain cancer in other brother    Allergies  Allergen Reactions  . Shellfish Allergy Anaphylaxis    Prior to Admission medications   Medication Sig Start Date End Date Taking? Authorizing Provider  allopurinol (ZYLOPRIM) 300 MG tablet Take 1 tablet (300 mg total) by mouth daily. 05/02/17   Quentin Angst, MD  ALPRAZolam Prudy Feeler) 0.5 MG tablet Take 1 tablet (0.5 mg total) by mouth 2 (two) times daily as needed for anxiety. 12/12/17   Quentin Angst, MD  CALCIUM-VITAMIN D PO Take 1 tablet by mouth daily.    [provider]  clotrimazole (LOTRIMIN) 1 % cream Apply 1 application topically 2 (two) times daily.    [provider]  divalproex (DEPAKOTE) 250 MG DR tablet Take 250 mg by mouth 3 (three) times daily.    [provider]  DULoxetine (CYMBALTA) 30 MG capsule Take 1 capsule (30 mg total) by mouth 2 (two) times daily. 11/20/17   Anders Simmonds, PA-C  enoxaparin (LOVENOX) 100 MG/ML injection Inject 1 mL (100 mg total) into the skin every 12 (twelve) hours. 04/23/18   Marcine Matar, MD  gabapentin (NEURONTIN) 400 MG capsule Take 1 capsule (400 mg total) 2 (two) times daily by mouth. 09/04/17   Jegede, Phylliss Blakes, MD  gabapentin (NEURONTIN) 600 MG tablet Take 600 mg by mouth 3 (three) times daily. 07/24/17   [provider]  hydrOXYzine  (ATARAX/VISTARIL) 25 MG tablet TAKE 1 TABLET BY MOUTH EVERY 6 HOURS AS NEEDED FOR ANXIETY 02/05/18   Hoy Register, MD  ibuprofen (ADVIL,MOTRIN) 800 MG tablet Take 1 tablet (800 mg total) by mouth every 8 (eight) hours as needed. 11/20/17   Anders Simmonds, PA-C  losartan (COZAAR) 100 MG tablet TAKE 1 TABLET BY MOUTH DAILY. 04/07/18   Marcine Matar, MD  meclizine (ANTIVERT) 25 MG tablet 1 tab PO daily x 1 week then daily PRN 03/04/18   Marcine Matar, MD  methocarbamol (ROBAXIN) 750 MG tablet TAKE 1 TABLET BY MOUTH EVERY 8 HOURS AS NEEDED FOR MUSCLE SPASMS. 02/13/18   Hoy Register, MD  metoCLOPramide (REGLAN) 10 MG tablet TAKE 1 TABLET BY MOUTH EVERY 6 HOURS AS NEEDED FOR NAUSEA OR HEADACHE. 04/22/18   Laural Benes,  Binnie Rail, MD  Multiple Vitamin (MULTIVITAMIN) tablet Take 1 tablet by mouth daily.    [provider]  nitroGLYCERIN (NITROSTAT) 0.4 MG SL tablet Place 1 tablet (0.4 mg total) under the tongue every 5 (five) minutes as needed for chest pain. Patient not taking: Reported on 04/11/2018 05/02/17   Quentin Angst, MD  nitroGLYCERIN (NITROSTAT) 0.4 MG SL tablet PLACE 1 TABLET UNDER THE TONGUE EVERY 5 MINUTES AS NEEDED FOR CHEST PAIN 01/13/18   Leone Brand, NP  omeprazole (PRILOSEC) 20 MG capsule Take 20 mg by mouth daily. 01/07/18   [provider]  oxyCODONE HCl 7.5 MG TABA Take 7.5 mg by mouth 3 (three) times daily.     [provider]  pantoprazole (PROTONIX) 40 MG tablet Take 1 tablet (40 mg total) by mouth at bedtime. 12/04/17   Quentin Angst, MD  rosuvastatin (CRESTOR) 40 MG tablet Take 1 tablet (40 mg total) at bedtime by mouth. 09/04/17 04/11/18  Quentin Angst, MD  warfarin (COUMADIN) 5 MG tablet Take 1 tablet (5 mg total) by mouth daily. Patient taking differently: Take 5 mg by mouth daily. Take 5mg  by mouth on Monday, Wednesday and Friday and 2.5mg  Tuesday, Thursday, Saturday and Sunday 01/29/18   Quentin Angst, MD  famotidine  (PEPCID) 20 MG tablet Take 1 tablet (20 mg total) by mouth 2 (two) times daily. 12/04/11 12/30/11  Grant Fontana, PA-C  TACRINE HYDROCHLORIDE PO Take 25 mg by mouth daily.    12/30/11  [provider]    VITAL SIGNS: BP 130/81   Pulse 88   Temp 97.9 F (36.6 C) (Oral)   Resp 14 Comment: right arm  Ht 5\' 11"  (1.803 m)   Wt 95.7 kg (211 lb)   SpO2 94%   BMI 29.43 kg/m   INTAKE / OUTPUT: No intake/output data recorded.  PHYSICAL EXAMINATION: GENERAL: alert, oriented to person, place, and time, drowsy. Cooperative. No acute distress. HEAD: normocephalic, atraumatic EYE: PERRLA, EOM intact, no scleral icterus, no pallor. NOSE: nares are patent. No polyps. No exudate. THROAT/ORAL CAVITY: Normal dentition. No oral thrush. No exudate. Mucous membranes are moist. No tonsillar enlargement.  NECK: supple, no thyromegaly, no JVD, no lymphadenopathy. Trachea midline. CHEST/LUNG: symmetric in development and expansion. Good air entry. no crackles. No wheezes. HEART: Regular S1 and S2 with 3/6 systolic murmur best heard in the mitral area.   ABDOMEN: soft, nontender, nondistended. Normoactive bowel sounds. No rebound. No guarding. No hepatosplenomegaly. EXTREMITIES: Edema: 1+. No cyanosis. No clubbing. 2+ DP pulses LYMPHATIC: no cervical/axiallary/inguinal lymph nodes appreciated MUSCULOSKELETAL: No point tenderness. No bulk atrophy. Joints: normal. . SKIN:  No rash or lesion. NEUROLOGIC: Cranial nerves II-XII are grossly symmetric and physiologic. Babinski absent. No sensory deficit. Motor: 5/5 @ RUE, 5/5 @ LUE, 5/5 @ RLL,  5/5 @ LLL.  DTR: 2+ @ R biceps, 2+ @ L biceps, 2+ @ R patellar,  2+ @ L patellar. No cerebellar signs. Gait was not assessed.   LABS: Lab Results  Component Value Date   TROPONINI <0.03 04/26/2018    BASIC METABOLIC PROFILE Recent Labs  Lab 04/26/18 2326 04/26/18 2345 04/27/18 0316  NA 137 139  --   K 3.8 3.8  --   CL 112* 110  --   CO2 13*  --   --    BUN 35* 38*  --   CREATININE 2.84* 2.90*  --   GLUCOSE 113* 112*  --   CALCIUM 8.0*  --   --  MG  --   --  2.0    Liver Enzymes Recent Labs  Lab 04/26/18 2326  AST 23  ALT 24  ALKPHOS 42  BILITOT 0.5  ALBUMIN 3.5    CBC Recent Labs  Lab 04/26/18 2326 04/26/18 2345  WBC 6.1  --   HGB 13.8 14.3  HCT 43.7 42.0  PLT 197  --     COAGULATION STUDIES Recent Labs  Lab 04/23/18 1037 04/25/18 0938 04/26/18 2326  INR 1.2* 1.4* 1.98    SEPSIS MARKERS Recent Labs  Lab 04/26/18 2345 04/27/18 0336  LATICACIDVEN 3.60* 1.25    ABG Recent Labs  Lab 04/27/18 0005  PHART 7.239*  PCO2ART 37.5  PO2ART 96.0    Cardiac Enzymes Recent Labs  Lab 04/26/18 2326  TROPONINI <0.03    Imaging Ct Abdomen Pelvis Wo Contrast  Result Date: 04/27/2018 CLINICAL DATA:  Acute generalized abdominal pain. EXAM: CT ABDOMEN AND PELVIS WITHOUT CONTRAST TECHNIQUE: Multidetector CT imaging of the abdomen and pelvis was performed following the standard protocol without IV contrast. COMPARISON:  03/13/2017 FINDINGS: Lower chest: Atelectasis in the lung bases. Coronary artery calcifications. Hepatobiliary: No focal liver abnormality is seen. No gallstones, gallbladder wall thickening, or biliary dilatation. Pancreas: Unremarkable. No pancreatic ductal dilatation or surrounding inflammatory changes. Spleen: Normal in size without focal abnormality. Adrenals/Urinary Tract: Adrenal glands are unremarkable. Kidneys are normal, without renal calculi, focal lesion, or hydronephrosis. Bladder is unremarkable. Stomach/Bowel: Stomach, small bowel, and colon are not abnormally distended. No wall thickening is appreciated although under distention limits evaluation. No inflammatory infiltration. Scattered diverticula in the colon. No evidence of diverticulitis. Appendix is normal. Vascular/Lymphatic: No significant vascular findings are present. No enlarged abdominal or pelvic lymph nodes. Reproductive:  Prostate gland is not enlarged. Other: No free air or free fluid in the abdomen. Abdominal wall musculature appears intact. Postoperative changes in the left inguinal region suggesting previous hernia repair. There patchy areas of infiltration and emphysema in the subcutaneous fat of the anterior abdominal wall. These likely represent subcutaneous injection sites. Surface contusion or laceration could also have this appearance in the appropriate clinical setting. Musculoskeletal: Degenerative changes in the lumbar spine. No destructive bone lesions. IMPRESSION: 1. No evidence of bowel obstruction or inflammation. 2. Atelectasis in the lung bases. 3. Coronary artery calcifications. 4. Patchy areas of infiltration and emphysema in the subcutaneous fat of the anterior abdominal wall, likely representing subcutaneous injection sites. Electronically Signed   By: Burman Nieves M.D.   On: 04/27/2018 01:30   Ct Head Wo Contrast  Result Date: 04/27/2018 CLINICAL DATA:  Altered level of consciousness. EXAM: CT HEAD WITHOUT CONTRAST TECHNIQUE: Contiguous axial images were obtained from the base of the skull through the vertex without intravenous contrast. COMPARISON:  CT head 07/31/2017.  MRI brain 05/15/2017. FINDINGS: Brain: No evidence of acute infarction, hemorrhage, hydrocephalus, extra-axial collection or mass lesion/mass effect. Mild diffuse cerebral atrophy. Vascular: No hyperdense vessel or unexpected calcification. Skull: Normal. Negative for fracture or focal lesion. Sinuses/Orbits: No acute finding. Other: None. IMPRESSION: No acute intracranial abnormalities.  Mild cerebral atrophy. Electronically Signed   By: Burman Nieves M.D.   On: 04/27/2018 01:25   Dg Chest Portable 1 View  Result Date: 04/26/2018 CLINICAL DATA:  Unresponsive, abdominal and leg pain EXAM: PORTABLE CHEST 1 VIEW COMPARISON:  Portable exam 2331 hours compared to 03/12/2017 FINDINGS: External pacing leads project over chest. Normal  heart size, mediastinal contours, and pulmonary vascularity. Low lung volumes with bibasilar atelectasis. No definite infiltrate, pleural  effusion or pneumothorax. Cervical spine fusion. IMPRESSION: Low lung volumes with bibasilar atelectasis. Electronically Signed   By: Ulyses Southward M.D.   On: 04/26/2018 23:43   Dg Abd Portable 1 View  Result Date: 04/27/2018 CLINICAL DATA:  NG tube placement EXAM: PORTABLE ABDOMEN - 1 VIEW COMPARISON:  04/26/2018 FINDINGS: Elevation of the left diaphragm with atelectasis at the left base. Esophageal tube tip overlies the gastric body. Mild diffuse increased bowel gas. IMPRESSION: Esophageal tube tip overlies the gastric body Electronically Signed   By: Jasmine Pang M.D.   On: 04/27/2018 03:15   Dg Abd Portable 1 View  Result Date: 04/26/2018 CLINICAL DATA:  Abdominal pain, unresponsive EXAM: PORTABLE ABDOMEN - 1 VIEW COMPARISON:  Portable exam 2331 hours compared to CT abdomen and pelvis of 03/13/2017 FINDINGS: Gaseous distention of stomach. Large and small bowel gas pattern normal. No bowel dilatation or bowel wall thickening. Degenerative changes at lower lumbar spine. IMPRESSION: Gaseous distention of stomach. Electronically Signed   By: Ulyses Southward M.D.   On: 04/26/2018 23:44    STUDIES:  12-lead EKG (04/27/2018 0423): Sinus rhythm.  First-degree AV block.  Abnormal R wave progression.  T wave inversions in I and aVL.  ST elevation in V1 and V2. 12-lead EKG (04/27/2018 0411): Complete heart block; abnormal R wave progression. 12-lead EKG (04/26/2018 2323): Abnormal R wave progression; prolonged QT.   CULTURES: Results for orders placed or performed during the hospital encounter of 01/21/17  Surgical pcr screen     Status: Abnormal   Collection Time: 01/21/17  1:26 PM  Result Value Ref Range Status   MRSA, PCR NEGATIVE NEGATIVE Final   Staphylococcus aureus POSITIVE (A) NEGATIVE Final    Comment:        The Xpert SA Assay (FDA approved for NASAL specimens in  patients over 64 years of age), is one component of a comprehensive surveillance program.  Test performance has been validated by [facility] for patients greater than or equal to 23 year old. It is not intended to diagnose infection nor to guide or monitor treatment.     ANTIBIOTICS: Zosyn (7/6>> Vancomycin (7/6>>  SIGNIFICANT EVENTS: 7/6: Presented to [facility] emergency department with complaints of abdominal pain.  During ER evaluation, the patient demonstrated complete heart block.   ASSESSMENT / PLAN: Principal Problem:   Syncope Active Problems:   Complete heart block (HCC)   Acute kidney injury (HCC)   CAD-50% LAD 2011   Lactic acidosis   Abdominal pain   Chronic anticoagulation   By systems:  CARDIOVASCULAR  Syncope  Complete heart block  Abnormal EKG: First-degree AV block; abnormal R wave progression; T wave inversions in lateral leads; ST elevation in V1 and V2 Case discussed with Dr. Santiago Glad. No need to heparinize at this time from a myocardial ischemia standpoint; however, as the patient is on lifelong anticoagulation with warfarin and at high risk for recurrent venous thromboembolism; discontinuation of his warfarin will warrant bridging therapy. 2D echocardiographic interrogation. Please note: If this patient is to undergo cardiac catheterization, right heart catheterization should be included.   RENAL  Acute kidney injury  Partially compensated non-anion gap metabolic acidosis Repeat ABG.   ENDOCRINE  Type 2 diabetes mellitus Sliding scale insulin   NEUROLOGIC  Acute loss of consciousness/suspected cardiogenic syncope  GASTROINTESTINAL  Abdominal pain, resolved.  CT only demonstrated gastric gaseous distention.  GI prophylaxis: Famotidine  Keep NG tube for now.  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  DVT prophylaxis: Warfarin Ideally, this patient will  continue to to experience improvement in renal function, potentially allowing CTA of the chest.  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  However, the patient could have chronic thromboembolic disease in the pulmonary circulation which would raise concern for the possibility of chronic thromboembolic pulmonary hypertension. Alternatively, cardiac evaluation will need to include right heart catheterization.   PULMONARY: No acute issues   INFECTIOUS: No acute issues I do not see an indication for empiric antibiotic therapy at this time.  Stop Zosyn/vancomycin.    Admit to ICU under my service (Attending: Marcelle Smiling, MD) with the diagnoses highlighted above in the active Hospital Problem List (ASSESSMENT). IV fluids: Normal saline at 100 mL/h.  Electrolyte replacement per ICU protocol. Meds: Restart home medications, except hydroxyzine, meclizine, alprazolam, ibuprofen. Activity: Bedrest for now Labs: CBC, BMP, Mg, Phos in a.m.  NUTRITION: NPO until seen by cardiology.  If no plans for cardiac catheterization, start cardiac diet.  Critical care time: 60 minutes.  The treatment and management of the patient's condition was required based on the threat of imminent deterioration. This time reflects time spent by the physician evaluating, providing care and managing the critically ill patient's care. The time was spent at the immediate bedside (or on the same floor/unit and dedicated to this patient's care). Time involved in separately billable procedures is NOT included int he critical care time indicated above. Family meeting and update time may be included above if and only if the patient is unable/incompetent to participate in clinical interview and/or decision making, and the discussion was necessary to determining treatment decisions.   Marcelle Smiling, MD Board Certified by the ABIM, Pulmonary Diseases & Critical Care  Medicine [facility] Pulmonary/Critical Care Critical Care Pager: [phone]  04/27/2018, 4:48 AM

## 2018-04-27 NOTE — Progress Notes (Signed)
[facility] PCCM Attending  S: came to the ER with complete heart block, pulmonary asked to admit; drowsy this morning, says that he had some abdominal pain the day prior to admission that was sharp; has been in sinus rhythm since admission to CCU  O:  Vitals:   04/27/18 0615 04/27/18 0700 04/27/18 0715 04/27/18 0730  BP:  127/81 112/73 111/72  Pulse: 85 80  81  Resp: 14 13 13 13   Temp:      TempSrc:      SpO2:  95%  96%  Weight:      Height:       RA  General:  Resting comfortably in bed HENT: NCAT OP clear PULM: CTA B, normal effort CV: RRR, no mgr GI: BS+, soft, nontender MSK: normal bulk and tone Neuro: drowsy but able to answer questions, no distress, MAEW  CBC    Component Value Date/Time   WBC 4.9 04/27/2018 0632   RBC 4.43 04/27/2018 0632   HGB 13.1 04/27/2018 0632   HGB 14.2 04/07/2018 1431   HCT 41.5 04/27/2018 0632   HCT 43.5 04/07/2018 1431   PLT 178 04/27/2018 0632   PLT 215 04/07/2018 1431   MCV 93.7 04/27/2018 0632   MCV 91 04/07/2018 1431   MCH 29.6 04/27/2018 0632   MCHC 31.6 04/27/2018 0632   RDW 14.0 04/27/2018 0632   RDW 15.5 (H) 04/07/2018 1431   LYMPHSABS 2.7 04/26/2018 2326   LYMPHSABS 2.1 04/07/2018 1431   MONOABS 0.8 04/26/2018 2326   EOSABS 0.0 04/26/2018 2326   EOSABS 0.1 04/07/2018 1431   BASOSABS 0.0 04/26/2018 2326   BASOSABS 0.0 04/07/2018 1431   BMET    Component Value Date/Time   NA 142 04/27/2018 0632   NA 147 (H) 03/04/2018 1441   K 4.0 04/27/2018 0632   CL 113 (H) 04/27/2018 0632   CO2 22 04/27/2018 0632   GLUCOSE 123 (H) 04/27/2018 0632   BUN 29 (H) 04/27/2018 0632   BUN 11 03/04/2018 1441   CREATININE 2.03 (H) 04/27/2018 0632   CALCIUM 7.7 (L) 04/27/2018 0632   GFRNONAA 33 (L) 04/27/2018 0632   GFRAA 38 (L) 04/27/2018 7356   Impression: Abdominal pain AKI Lactic acidosis> resolved Complete heart block> resolved History of PE LVH Bipolar disorder  Discussion: I'm not really sure what happened here as he  complained of abdominal pain but his physical exam and CT scan are within normal limits.  Transient bowel ischemia? I do worry that perhaps the complete heart block may have been the cause of the symptoms rather than the result of the relatively mild acidosis and and AKI.  Have confirmed cardiology they will see in consultation this morning. Related to duloxetine or gabapentin?  Plan: Stop duloxetine and gabapentin Start IV fluids Keep NPO for now Monitor in SDU status Tele Cardiology consult Continue valproic acid Repeat BMET in AM  Transfer to [facility]   Heber Kermit, MD [facility] PCCM Pager: [phone] Cell: [phone] After 3pm or if no response, call [phone]

## 2018-04-27 NOTE — Progress Notes (Signed)
eLink Physician-Brief Progress Note Patient Name: Russell Carter DOB: Feb 06, 1956 MRN: [id number]   Date of Service  04/27/2018  HPI/Events of Note  Patient is eating a PO diet and drinking fluids. Creatinine has decreased to 2.03, K+ = 4.0 and urine output is satisfactory.   eICU Interventions  Will order: 1.Decrease LR IV fluid to 50 mL/hour.      Intervention Category Major Interventions: Other:  Sommer,Steven Dennard Nip 04/27/2018, 9:03 PM

## 2018-04-27 NOTE — Plan of Care (Signed)

## 2018-04-27 NOTE — Consult Note (Signed)
Cardiology Consultation:   Patient ID: Russell Carter; [id number]; 08/24/1956   Admit date: 04/26/2018 Date of Consult: 04/27/2018  Primary Care Provider: Marcine Matar, MD Primary Cardiologist: Charlton Haws, MD  Primary Electrophysiologist:  None   Patient Profile:   Russell Carter is a 62 y.o. male with a hx of CAD/ RCA DES, PE, CVA, chronic anticoagulation,and chronic back and leg pain who is being seen today for the evaluation of severe bradycardia and syncope in the setting of acute kidney injury and acidosis at the request of McQuaid.  History of Present Illness:   Russell Carter is accompanied by his wife at the bedside.  The patient is lethargic.  The wife gives a history of 48 hours duration of feeling poorly.  24 hours prior to admission he complained of fatigue back and leg discomfort.  At around 9 PM on the day of admission, he developed abdominal discomfort, weakness, and decreased responsiveness according to his wife.  EMS was called.  According to his wife he never lost consciousness.  He is arousable.  He falls asleep if not stimulated.  He denies dyspnea and chest pain.  Complaining of back pain and states that it is near continuous.  He has been dealing with back pain for years.  Has history of CAD and was found to have 95% stenosis in mid RCA in 2017 treated with DES.  No longer on dual antiplatelet therapy.  Prior history of cocaine use, bipolar disorder, hypertension, hyperlipidemia, stroke, and renal insufficiency.   Past Medical History:  Diagnosis Date  . Anginal pain (HCC)   . Anxiety   . Arthritis   . Bipolar disorder (HCC)   . Blood in stool   . CAD (coronary artery disease)    a. 2011: cath showing mod non-obstructive disease b. 02/2016: cath with 95% stenosis in the Mid RCA. DES placed.  . Cervical stenosis of spine   . Cocaine abuse (HCC)    history  . Depression   . Diabetes mellitus without complication (HCC)   . Gout, unspecified   . Headache   .  HTN (hypertension)   . Hypercholesterolemia   . Hyperglycemia   . Migraines   . Myocardial infarction [facility]) 2008   drug cocaine and marijuana at time of mi none  since    . Pneumonia   . Rectal bleeding   . Renal insufficiency   . Seizures (HCC)   . Stroke [facility])     Past Surgical History:  Procedure Laterality Date  . ANTERIOR CERVICAL DECOMP/DISCECTOMY FUSION N/A 01/24/2017   Procedure: Cervical Four-Five,Cervical Five Six,Cervical Six-Seven Anterior cervical discectomy with fusion and plate with Cervical Three-Thoracic- One Laminectomy/Foraminotomy with Cervical Three-Thoracic- One Fixation and fusion;  Surgeon: Loura Halt Ditty, MD;  Location: [facility] OR;  Service: Neurosurgery;  Laterality: N/A;  Part-1 anterior approach  . CARDIAC CATHETERIZATION  2008; ~ 2011   [place]-40%lad  . CARDIAC CATHETERIZATION N/A 03/16/2016   Procedure: Left Heart Cath and Coronary Angiography;  Surgeon: Marykay Lex, MD;  Location: [facility] INVASIVE CV LAB;  Service: Cardiovascular;  Laterality: N/A;  . COLONOSCOPY WITH PROPOFOL N/A 04/17/2018   Procedure: COLONOSCOPY WITH PROPOFOL;  Surgeon: Graylin Shiver, MD;  Location: [facility] ENDOSCOPY;  Service: Endoscopy;  Laterality: N/A;  . CORONARY ANGIOPLASTY     STENTS  . EYE SURGERY Bilateral    "for double vision"  . INGUINAL HERNIA REPAIR  05/12/2012   Procedure: HERNIA REPAIR INGUINAL ADULT;  Surgeon: Liz Malady, MD;  Location:  [facility];  Service: General;  Laterality: Right;  . INGUINAL HERNIA REPAIR Left 02/02/2003   Dr. Violeta Gelinas. repair with mesh. Same procedure done again in  05/22/2004   . INGUINAL HERNIA REPAIR Left ?date 2nd OR  . POSTERIOR CERVICAL FUSION/FORAMINOTOMY N/A 01/24/2017   Procedure: Cervical Three-Thoracic One-Laminectomy/Foraminotomy with Cervical Three-Thoracic One Fixation and fusion;  Surgeon: Loura Halt Ditty, MD;  Location: [facility] OR;  Service: Neurosurgery;  Laterality: N/A;  Part-2 Posterior approach  .  RECONSTRUCT / STABILIZE DISTAL ULNA    . SHOULDER OPEN ROTATOR CUFF REPAIR Left 2010  . SHOULDER SURGERY Left    "injured on the job; had to cut small bone out"  . THUMB FUSION Right      Home Medications:  Prior to Admission medications   Medication Sig Start Date End Date Taking? Authorizing Provider  allopurinol (ZYLOPRIM) 300 MG tablet Take 1 tablet (300 mg total) by mouth daily. 05/02/17   Quentin Angst, MD  ALPRAZolam Prudy Feeler) 0.5 MG tablet Take 1 tablet (0.5 mg total) by mouth 2 (two) times daily as needed for anxiety. 12/12/17   Quentin Angst, MD  CALCIUM-VITAMIN D PO Take 1 tablet by mouth daily.    [provider]  clotrimazole (LOTRIMIN) 1 % cream Apply 1 application topically 2 (two) times daily.    [provider]  divalproex (DEPAKOTE) 250 MG DR tablet Take 250 mg by mouth 3 (three) times daily.    [provider]  DULoxetine (CYMBALTA) 30 MG capsule Take 1 capsule (30 mg total) by mouth 2 (two) times daily. 11/20/17   Anders Simmonds, PA-C  enoxaparin (LOVENOX) 100 MG/ML injection Inject 1 mL (100 mg total) into the skin every 12 (twelve) hours. 04/23/18   Marcine Matar, MD  gabapentin (NEURONTIN) 400 MG capsule Take 1 capsule (400 mg total) 2 (two) times daily by mouth. 09/04/17   Jegede, Phylliss Blakes, MD  gabapentin (NEURONTIN) 600 MG tablet Take 600 mg by mouth 3 (three) times daily. 07/24/17   [provider]  hydrOXYzine (ATARAX/VISTARIL) 25 MG tablet TAKE 1 TABLET BY MOUTH EVERY 6 HOURS AS NEEDED FOR ANXIETY 02/05/18   Hoy Register, MD  ibuprofen (ADVIL,MOTRIN) 800 MG tablet Take 1 tablet (800 mg total) by mouth every 8 (eight) hours as needed. 11/20/17   Anders Simmonds, PA-C  losartan (COZAAR) 100 MG tablet TAKE 1 TABLET BY MOUTH DAILY. 04/07/18   Marcine Matar, MD  meclizine (ANTIVERT) 25 MG tablet 1 tab PO daily x 1 week then daily PRN 03/04/18   Marcine Matar, MD  methocarbamol (ROBAXIN) 750 MG tablet TAKE 1  TABLET BY MOUTH EVERY 8 HOURS AS NEEDED FOR MUSCLE SPASMS. 02/13/18   Hoy Register, MD  metoCLOPramide (REGLAN) 10 MG tablet TAKE 1 TABLET BY MOUTH EVERY 6 HOURS AS NEEDED FOR NAUSEA OR HEADACHE. 04/22/18   Marcine Matar, MD  Multiple Vitamin (MULTIVITAMIN) tablet Take 1 tablet by mouth daily.    [provider]  nitroGLYCERIN (NITROSTAT) 0.4 MG SL tablet Place 1 tablet (0.4 mg total) under the tongue every 5 (five) minutes as needed for chest pain. Patient not taking: Reported on 04/11/2018 05/02/17   Quentin Angst, MD  nitroGLYCERIN (NITROSTAT) 0.4 MG SL tablet PLACE 1 TABLET UNDER THE TONGUE EVERY 5 MINUTES AS NEEDED FOR CHEST PAIN 01/13/18   Leone Brand, NP  omeprazole (PRILOSEC) 20 MG capsule Take 20 mg by mouth daily. 01/07/18   [provider]  oxyCODONE HCl 7.5 MG TABA Take 7.5 mg by mouth 3 (three) times daily.     [provider]  pantoprazole (PROTONIX) 40 MG tablet Take 1 tablet (40 mg total) by mouth at bedtime. 12/04/17   Quentin Angst, MD  rosuvastatin (CRESTOR) 40 MG tablet Take 1 tablet (40 mg total) at bedtime by mouth. 09/04/17 04/11/18  Quentin Angst, MD  warfarin (COUMADIN) 5 MG tablet Take 1 tablet (5 mg total) by mouth daily. Patient taking differently: Take 5 mg by mouth daily. Take 5mg  by mouth on Monday, Wednesday and Friday and 2.5mg  Tuesday, Thursday, Saturday and Sunday 01/29/18   Quentin Angst, MD  famotidine (PEPCID) 20 MG tablet Take 1 tablet (20 mg total) by mouth 2 (two) times daily. 12/04/11 12/30/11  Grant Fontana, PA-C  TACRINE HYDROCHLORIDE PO Take 25 mg by mouth daily.    12/30/11  [provider]    Inpatient Medications: Scheduled Meds: . allopurinol  300 mg Oral Daily  . atropine      . divalproex  250 mg Oral TID  . DULoxetine  30 mg Oral BID  . gabapentin  400 mg Oral BID  . heparin  5,000 Units Subcutaneous Q8H  . insulin aspart  0-15 Units Subcutaneous Q4H  . losartan  100 mg  Oral Daily  . multivitamin with minerals  1 tablet Oral Daily  . oxyCODONE  7.5 mg Oral TID  . pantoprazole  40 mg Oral Daily  . rosuvastatin  40 mg Oral QHS  . warfarin  5 mg Oral ONCE-1800  . Warfarin - Pharmacist Dosing Inpatient   Does not apply q1800   Continuous Infusions: . sodium chloride    . sodium chloride 100 mL/hr at 04/27/18 0800  . lactated ringers     PRN Meds: sodium chloride  Allergies:    Allergies  Allergen Reactions  . Shellfish Allergy Anaphylaxis    Social History:   Social History   Socioeconomic History  . Marital status: Widowed    Spouse name: Not on file  . Number of children: Not on file  . Years of education: Not on file  . Highest education level: Not on file  Occupational History  . Not on file  Social Needs  . Financial resource strain: Not on file  . Food insecurity:    Worry: Not on file    Inability: Not on file  . Transportation needs:    Medical: Not on file    Non-medical: Not on file  Tobacco Use  . Smoking status: Never Smoker  . Smokeless tobacco: Never Used  Substance and Sexual Activity  . Alcohol use: Yes    Comment: 16 oz beer every 2-3 days  . Drug use: Yes    Types: "Crack" cocaine, Marijuana, Cocaine    Comment:  "quit 2008"  . Sexual activity: Not Currently  Lifestyle  . Physical activity:    Days per week: Not on file    Minutes per session: Not on file  . Stress: Not on file  Relationships  . Social connections:    Talks on phone: Not on file    Gets together: Not on file    Attends religious service: Not on file    Active member of club or organization: Not on file    Attends meetings of clubs or organizations: Not on file    Relationship status: Not on file  . Intimate partner violence:    Fear of current or ex partner:  Not on file    Emotionally abused: Not on file    Physically abused: Not on file    Forced sexual activity: Not on file  Other Topics Concern  . Not on file  Social History  Narrative   Lives in [place] with his wife and son.     Family History:    Family History  Problem Relation Age of Onset  . Heart attack Mother        x7. Still alive  . Heart attack Father        deceased b/c of MI  . Heart attack Brother        older, deceased b/c of MI   . Cancer Brother        bone cancer in 1 brother, ? brain cancer in other brother     ROS:  Please see the history of present illness.  Back discomfort and leg pain. All other ROS reviewed and negative.     Physical Exam/Data:   Vitals:   04/27/18 0750 04/27/18 0800 04/27/18 0815 04/27/18 0830  BP:  120/77 113/81 127/83  Pulse:  78 79 83  Resp:  13 13 14   Temp: (!) 97.4 F (36.3 C)     TempSrc: Oral     SpO2:  94% 94% 94%  Weight:      Height:        Intake/Output Summary (Last 24 hours) at 04/27/2018 0851 Last data filed at 04/27/2018 0800 Gross per 24 hour  Intake 2333.92 ml  Output 200 ml  Net 2133.92 ml   Filed Weights   04/26/18 2337 04/27/18 0600  Weight: 211 lb (95.7 kg) 208 lb 12.4 oz (94.7 kg)   Body mass index is 29.12 kg/m.  General:  Well nourished, well developed, in no acute distress-easy and complaining of no chest discomfort. HEENT: normal Lymph: no adenopathy Neck: no JVD Endocrine:  No thryomegaly Vascular: No carotid bruits; FA pulses 2+ bilaterally without bruits  Cardiac:  normal S1, S2; RRR; no murmur  Lungs:  clear to auscultation bilaterally, no wheezing, rhonchi or rales  Abd: Tender right upper and mid abdomen.  Decreased bowel sounds. Ext: no edema Musculoskeletal:  No deformities, BUE and BLE strength normal and equal Skin: warm and dry  Neuro: Lethargic.  Arouses with verbal stimuli.  No obvious focal abnormality. Psych: Lethargic  EKG:  The EKG was personally reviewed and demonstrates: Early EKG demonstrated narrow complex bradycardia at 44 bpm.  Subsequent EKG reveals borderline first-degree AV block, tall R wave V2 which is new and possibly suggestive  of true posterior infarction, and otherwise unremarkable when compared to prior. Telemetry:  Telemetry was personally reviewed and demonstrates: Sinus rhythm  Relevant CV Studies: No current studies available.  Laboratory Data:  Chemistry Recent Labs  Lab 04/26/18 2326 04/26/18 2345 04/27/18 0632  NA 137 139 142  K 3.8 3.8 4.0  CL 112* 110 113*  CO2 13*  --  22  GLUCOSE 113* 112* 123*  BUN 35* 38* 29*  CREATININE 2.84* 2.90* 2.03*  CALCIUM 8.0*  --  7.7*  GFRNONAA 22*  --  33*  GFRAA 26*  --  38*  ANIONGAP 12  --  7    Recent Labs  Lab 04/26/18 2326 04/27/18 0632  PROT 6.1* 5.8*  ALBUMIN 3.5 3.2*  AST 23 22  ALT 24 22  ALKPHOS 42 42  BILITOT 0.5 0.5   Hematology Recent Labs  Lab 04/26/18 2326 04/26/18 2345 04/27/18 3254  WBC 6.1  --  4.9  RBC 4.63  --  4.43  HGB 13.8 14.3 13.1  HCT 43.7 42.0 41.5  MCV 94.4  --  93.7  MCH 29.8  --  29.6  MCHC 31.6  --  31.6  RDW 14.2  --  14.0  PLT 197  --  178   Cardiac Enzymes Recent Labs  Lab 04/26/18 2326 04/27/18 0632  TROPONINI <0.03 <0.03    Recent Labs  Lab 04/26/18 2343  TROPIPOC 0.01    BNPNo results for input(s): BNP, PROBNP in the last 168 hours.  DDimer No results for input(s): DDIMER in the last 168 hours.  Radiology/Studies:  Ct Abdomen Pelvis Wo Contrast  Result Date: 04/27/2018 CLINICAL DATA:  Acute generalized abdominal pain. EXAM: CT ABDOMEN AND PELVIS WITHOUT CONTRAST TECHNIQUE: Multidetector CT imaging of the abdomen and pelvis was performed following the standard protocol without IV contrast. COMPARISON:  03/13/2017 FINDINGS: Lower chest: Atelectasis in the lung bases. Coronary artery calcifications. Hepatobiliary: No focal liver abnormality is seen. No gallstones, gallbladder wall thickening, or biliary dilatation. Pancreas: Unremarkable. No pancreatic ductal dilatation or surrounding inflammatory changes. Spleen: Normal in size without focal abnormality. Adrenals/Urinary Tract: Adrenal  glands are unremarkable. Kidneys are normal, without renal calculi, focal lesion, or hydronephrosis. Bladder is unremarkable. Stomach/Bowel: Stomach, small bowel, and colon are not abnormally distended. No wall thickening is appreciated although under distention limits evaluation. No inflammatory infiltration. Scattered diverticula in the colon. No evidence of diverticulitis. Appendix is normal. Vascular/Lymphatic: No significant vascular findings are present. No enlarged abdominal or pelvic lymph nodes. Reproductive: Prostate gland is not enlarged. Other: No free air or free fluid in the abdomen. Abdominal wall musculature appears intact. Postoperative changes in the left inguinal region suggesting previous hernia repair. There patchy areas of infiltration and emphysema in the subcutaneous fat of the anterior abdominal wall. These likely represent subcutaneous injection sites. Surface contusion or laceration could also have this appearance in the appropriate clinical setting. Musculoskeletal: Degenerative changes in the lumbar spine. No destructive bone lesions. IMPRESSION: 1. No evidence of bowel obstruction or inflammation. 2. Atelectasis in the lung bases. 3. Coronary artery calcifications. 4. Patchy areas of infiltration and emphysema in the subcutaneous fat of the anterior abdominal wall, likely representing subcutaneous injection sites. Electronically Signed   By: Burman Nieves M.D.   On: 04/27/2018 01:30   Ct Head Wo Contrast  Result Date: 04/27/2018 CLINICAL DATA:  Altered level of consciousness. EXAM: CT HEAD WITHOUT CONTRAST TECHNIQUE: Contiguous axial images were obtained from the base of the skull through the vertex without intravenous contrast. COMPARISON:  CT head 07/31/2017.  MRI brain 05/15/2017. FINDINGS: Brain: No evidence of acute infarction, hemorrhage, hydrocephalus, extra-axial collection or mass lesion/mass effect. Mild diffuse cerebral atrophy. Vascular: No hyperdense vessel or  unexpected calcification. Skull: Normal. Negative for fracture or focal lesion. Sinuses/Orbits: No acute finding. Other: None. IMPRESSION: No acute intracranial abnormalities.  Mild cerebral atrophy. Electronically Signed   By: Burman Nieves M.D.   On: 04/27/2018 01:25   Dg Chest Portable 1 View  Result Date: 04/26/2018 CLINICAL DATA:  Unresponsive, abdominal and leg pain EXAM: PORTABLE CHEST 1 VIEW COMPARISON:  Portable exam 2331 hours compared to 03/12/2017 FINDINGS: External pacing leads project over chest. Normal heart size, mediastinal contours, and pulmonary vascularity. Low lung volumes with bibasilar atelectasis. No definite infiltrate, pleural effusion or pneumothorax. Cervical spine fusion. IMPRESSION: Low lung volumes with bibasilar atelectasis. Electronically Signed   By: Ulyses Southward M.D.   On:  04/26/2018 23:43   Dg Abd Portable 1 View  Result Date: 04/27/2018 CLINICAL DATA:  NG tube placement EXAM: PORTABLE ABDOMEN - 1 VIEW COMPARISON:  04/26/2018 FINDINGS: Elevation of the left diaphragm with atelectasis at the left base. Esophageal tube tip overlies the gastric body. Mild diffuse increased bowel gas. IMPRESSION: Esophageal tube tip overlies the gastric body Electronically Signed   By: Jasmine Pang M.D.   On: 04/27/2018 03:15   Dg Abd Portable 1 View  Result Date: 04/26/2018 CLINICAL DATA:  Abdominal pain, unresponsive EXAM: PORTABLE ABDOMEN - 1 VIEW COMPARISON:  Portable exam 2331 hours compared to CT abdomen and pelvis of 03/13/2017 FINDINGS: Gaseous distention of stomach. Large and small bowel gas pattern normal. No bowel dilatation or bowel wall thickening. Degenerative changes at lower lumbar spine. IMPRESSION: Gaseous distention of stomach. Electronically Signed   By: Ulyses Southward M.D.   On: 04/26/2018 23:44    Assessment and Plan:   1. Narrow complex severe bradycardia likely likely multifactorial including vagal component related to GI distention and pain, metabolic derangement  associated with acute kidney injury, and possibly ischemia related given tall R wave in V2.  Bradycardia has improved without specific therapy. 2. Acute kidney injury, with background history of CKD.  Stage IV based upon creatinine clearance.  Improving with hydration. 3. History of CAD with RCA stent 2017.  Serial cardiac markers should be done to exclude ischemia.  Echocardiogram will be ordered to assess LV function and rule out regional wall motion abnormality. 4. Abdominal discomfort and distention, uncertain etiology 5. History of CVA 6. History of cocaine use 7. History of PE and recurrent DVT  Continue cardiac monitoring.  2D echocardiogram to assess LV function, serial cardiac markers, and further work-up for bradycardia if needed based upon accumulating database.  Currently I believe bradycardia was secondary to other issues and will not require permanent pacemaker therapy but will follow.   For questions or updates, please contact [facility] Please consult [email or web address] for contact info under Cardiology/STEMI.   Signed, Lesleigh Noe, MD  04/27/2018 8:51 AM

## 2018-04-27 NOTE — ED Notes (Signed)
MD notified of patients bradycardia. Order received for EKG. Completed and delivered to MD

## 2018-04-27 NOTE — Progress Notes (Signed)
ANTICOAGULATION CONSULT NOTE - Initial Consult  Pharmacy Consult for Warfarin  Indication: pulmonary embolus  Allergies  Allergen Reactions  . Shellfish Allergy Anaphylaxis   Patient Measurements: Height: 5\' 11"  (180.3 cm) Weight: 211 lb (95.7 kg) IBW/kg (Calculated) : 75.3  Vital Signs: Temp: 97.9 F (36.6 C) (07/06 2340) Temp Source: Oral (07/06 2340) BP: 130/83 (07/07 0545) Pulse Rate: 81 (07/07 0545)  Labs: Recent Labs    04/25/18 0938 04/26/18 2326 04/26/18 2345  HGB  --  13.8 14.3  HCT  --  43.7 42.0  PLT  --  197  --   LABPROT  --  22.3*  --   INR 1.4* 1.98  --   CREATININE  --  2.84* 2.90*  CKTOTAL  --  544*  --   TROPONINI  --  <0.03  --     Estimated Creatinine Clearance: 30.8 mL/min (A) (by C-G formula based on SCr of 2.9 mg/dL (H)).   Medical History: Past Medical History:  Diagnosis Date  . Anginal pain (HCC)   . Anxiety   . Arthritis   . Bipolar disorder (HCC)   . Blood in stool   . CAD (coronary artery disease)    a. 2011: cath showing mod non-obstructive disease b. 02/2016: cath with 95% stenosis in the Mid RCA. DES placed.  . Cervical stenosis of spine   . Cocaine abuse (HCC)    history  . Depression   . Diabetes mellitus without complication (HCC)   . Gout, unspecified   . Headache   . HTN (hypertension)   . Hypercholesterolemia   . Hyperglycemia   . Migraines   . Myocardial infarction [facility]) 2008   drug cocaine and marijuana at time of mi none  since    . Pneumonia   . Rectal bleeding   . Renal insufficiency   . Seizures (HCC)   . Stroke [facility])    Assessment: 62 y/o M with unresponsiveness and bradycardia, CBC good good, INR is just below goal at 1.98 last night  Current warfarin dosing per outpatient anti-coag notes: 5 mg MWF, 2.5 mg on all other days  Goal of Therapy:  INR 2-3 Monitor platelets by anticoagulation protocol: Yes   Plan:  Warfarin 5 mg PO x 1 today (extra 2.5 mg from normal dose) Daily PT/INR Monitor for  bleeding   Abran Duke 04/27/2018,5:53 AM

## 2018-04-27 NOTE — ED Notes (Signed)
CCM at bedside 

## 2018-04-28 ENCOUNTER — Inpatient Hospital Stay (HOSPITAL_COMMUNITY): Payer: Medicaid Other

## 2018-04-28 ENCOUNTER — Telehealth: Payer: Self-pay | Admitting: Internal Medicine

## 2018-04-28 DIAGNOSIS — I2510 Atherosclerotic heart disease of native coronary artery without angina pectoris: Secondary | ICD-10-CM

## 2018-04-28 DIAGNOSIS — I5030 Unspecified diastolic (congestive) heart failure: Secondary | ICD-10-CM

## 2018-04-28 DIAGNOSIS — I200 Unstable angina: Secondary | ICD-10-CM

## 2018-04-28 DIAGNOSIS — I25118 Atherosclerotic heart disease of native coronary artery with other forms of angina pectoris: Secondary | ICD-10-CM

## 2018-04-28 LAB — HEPATIC FUNCTION PANEL
ALBUMIN: 3 g/dL — AB (ref 3.5–5.0)
ALT: 20 U/L (ref 0–44)
AST: 22 U/L (ref 15–41)
Alkaline Phosphatase: 40 U/L (ref 38–126)
BILIRUBIN DIRECT: 0.1 mg/dL (ref 0.0–0.2)
Indirect Bilirubin: 0.4 mg/dL (ref 0.3–0.9)
TOTAL PROTEIN: 5.5 g/dL — AB (ref 6.5–8.1)
Total Bilirubin: 0.5 mg/dL (ref 0.3–1.2)

## 2018-04-28 LAB — GLUCOSE, CAPILLARY
GLUCOSE-CAPILLARY: 104 mg/dL — AB (ref 70–99)
GLUCOSE-CAPILLARY: 103 mg/dL — AB (ref 70–99)
GLUCOSE-CAPILLARY: 85 mg/dL (ref 70–99)
Glucose-Capillary: 107 mg/dL — ABNORMAL HIGH (ref 70–99)
Glucose-Capillary: 115 mg/dL — ABNORMAL HIGH (ref 70–99)
Glucose-Capillary: 117 mg/dL — ABNORMAL HIGH (ref 70–99)

## 2018-04-28 LAB — PROTIME-INR
INR: 2.36
PROTHROMBIN TIME: 25.6 s — AB (ref 11.4–15.2)

## 2018-04-28 LAB — BASIC METABOLIC PANEL
Anion gap: 4 — ABNORMAL LOW (ref 5–15)
BUN: 14 mg/dL (ref 8–23)
CALCIUM: 8 mg/dL — AB (ref 8.9–10.3)
CO2: 25 mmol/L (ref 22–32)
CREATININE: 1.41 mg/dL — AB (ref 0.61–1.24)
Chloride: 113 mmol/L — ABNORMAL HIGH (ref 98–111)
GFR calc Af Amer: 60 mL/min — ABNORMAL LOW (ref >60)
GFR, EST NON AFRICAN AMERICAN: 52 mL/min — AB (ref >60)
Glucose, Bld: 122 mg/dL — ABNORMAL HIGH (ref 70–99)
Potassium: 4 mmol/L (ref 3.5–5.1)
SODIUM: 142 mmol/L (ref 135–145)

## 2018-04-28 LAB — CBC
HCT: 39.6 % (ref 39.0–52.0)
Hemoglobin: 12.5 g/dL — ABNORMAL LOW (ref 13.0–17.0)
MCH: 29.6 pg (ref 26.0–34.0)
MCHC: 31.6 g/dL (ref 30.0–36.0)
MCV: 93.8 fL (ref 78.0–100.0)
PLATELETS: 179 10*3/uL (ref 150–400)
RBC: 4.22 MIL/uL (ref 4.22–5.81)
RDW: 14.4 % (ref 11.5–15.5)
WBC: 4.5 10*3/uL (ref 4.0–10.5)

## 2018-04-28 LAB — PROCALCITONIN

## 2018-04-28 LAB — PHOSPHORUS: PHOSPHORUS: 2.8 mg/dL (ref 2.5–4.6)

## 2018-04-28 LAB — ECHOCARDIOGRAM COMPLETE
Height: 71 in
Weight: 3449.76 oz

## 2018-04-28 LAB — MAGNESIUM: MAGNESIUM: 2 mg/dL (ref 1.7–2.4)

## 2018-04-28 LAB — TROPONIN I
Troponin I: <0.03 ng/mL (ref <0.03)
Troponin I: <0.03 ng/mL (ref <0.03)

## 2018-04-28 LAB — LIPASE, BLOOD: Lipase: 26 U/L (ref 11–51)

## 2018-04-28 MED ORDER — TECHNETIUM TC 99M MEBROFENIN IV KIT
5.0000 | PACK | Freq: Once | INTRAVENOUS | Status: AC | PRN
  Administered 2018-04-28: 5 via INTRAVENOUS

## 2018-04-28 MED ORDER — DIPHENHYDRAMINE HCL 50 MG/ML IJ SOLN
25.0000 mg | Freq: Three times a day (TID) | INTRAMUSCULAR | Status: DC | PRN
  Administered 2018-04-30: 25 mg via INTRAVENOUS
  Filled 2018-04-28: qty 1

## 2018-04-28 MED ORDER — NITROGLYCERIN 0.4 MG SL SUBL
0.4000 mg | SUBLINGUAL_TABLET | SUBLINGUAL | Status: DC | PRN
  Administered 2018-04-28 (×3): 0.4 mg via SUBLINGUAL
  Filled 2018-04-28 (×2): qty 1

## 2018-04-28 MED ORDER — MORPHINE SULFATE (PF) 2 MG/ML IV SOLN
2.0000 mg | INTRAVENOUS | Status: DC | PRN
  Administered 2018-04-28 – 2018-04-29 (×3): 2 mg via INTRAVENOUS
  Filled 2018-04-28 (×4): qty 1

## 2018-04-28 NOTE — Progress Notes (Signed)
ANTICOAGULATION CONSULT NOTE - Follow-Up Consult  Pharmacy Consult for Warfarin >> heparin Indication: pulmonary embolus  Allergies  Allergen Reactions  . Shellfish Allergy Anaphylaxis   Patient Measurements: Height: 5\' 11"  (180.3 cm) Weight: 215 lb 9.8 oz (97.8 kg) IBW/kg (Calculated) : 75.3  Vital Signs: Temp: 98.3 F (36.8 C) (07/08 0737) Temp Source: Oral (07/08 0737) BP: 135/82 (07/08 0900) Pulse Rate: 74 (07/08 0900)  Labs: Recent Labs    04/26/18 2326 04/26/18 2345 04/27/18 0632  04/27/18 1852 04/28/18 0224 04/28/18 0832  HGB 13.8 14.3 13.1  --   --  12.5*  --   HCT 43.7 42.0 41.5  --   --  39.6  --   PLT 197  --  178  --   --  179  --   LABPROT 22.3*  --   --   --   --  25.6*  --   INR 1.98  --   --   --   --  2.36  --   CREATININE 2.84* 2.90* 2.03*  --   --  1.41*  --   CKTOTAL 544*  --   --   --   --   --   --   TROPONINI <0.03  --  <0.03   < > <0.03 <0.03 <0.03   < > = values in this interval not displayed.    Estimated Creatinine Clearance: 63.9 mL/min (A) (by C-G formula based on SCr of 1.41 mg/dL (H)).   Medical History: Past Medical History:  Diagnosis Date  . Anginal pain (HCC)   . Anxiety   . Arthritis   . Bipolar disorder (HCC)   . Blood in stool   . CAD (coronary artery disease)    a. 2011: cath showing mod non-obstructive disease b. 02/2016: cath with 95% stenosis in the Mid RCA. DES placed.  . Cervical stenosis of spine   . Cocaine abuse (HCC)    history  . Depression   . Diabetes mellitus without complication (HCC)   . Gout, unspecified   . Headache   . HTN (hypertension)   . Hypercholesterolemia   . Hyperglycemia   . Migraines   . Myocardial infarction [facility]) 2008   drug cocaine and marijuana at time of mi none  since    . Pneumonia   . Rectal bleeding   . Renal insufficiency   . Seizures (HCC)   . Stroke [facility])    Assessment: 1 yoM admitted with bradycardia. Pt on warfarin PTA for hx PE and CVA, INR subtherapeutic on  admit now 2.3 s/p resuming 7/7. Per cards, likely no indication for PPM but likely to need stress test vs cath given ongoing CP. Will hold warfarin and initiate IV heparin once INR < 2.   Goal of Therapy:  Heparin Level 0.3-0.7 Monitor platelets by anticoagulation protocol: Yes   Plan:  Hold warfarin tonight Heparin once INR < 2  Fredonia Highland, PharmD, BCPS Clinical Pharmacist [phone] Please check AMION for all [facility] Pharm[facility] numbers 04/28/2018

## 2018-04-28 NOTE — Progress Notes (Addendum)
Progress Note  Patient Name: Russell Carter Date of Encounter: 04/28/2018  Primary Cardiologist: Charlton Haws, MD   Subjective   62 yo male, admitted with abdominal pain and bradycardia .  Also had a metabolic acidosis and worsening CKD.  Renal function and bradycardia have improved with time and IV hydration.  He complains of some chest pain this am  Troponin levels have been negative so far.   Inpatient Medications    Scheduled Meds: . allopurinol  300 mg Oral Daily  . divalproex  250 mg Oral TID  . insulin aspart  0-15 Units Subcutaneous Q4H  . losartan  100 mg Oral Daily  . multivitamin with minerals  1 tablet Oral Daily  . oxyCODONE  7.5 mg Oral TID  . pantoprazole  40 mg Oral Daily  . rosuvastatin  40 mg Oral QHS  . Warfarin - Pharmacist Dosing Inpatient   Does not apply q1800   Continuous Infusions: . sodium chloride    . lactated ringers 50 mL/hr at 04/28/18 0800   PRN Meds: sodium chloride, morphine injection, nitroGLYCERIN   Vital Signs    Vitals:   04/28/18 0830 04/28/18 0833 04/28/18 0835 04/28/18 0840  BP: (!) 171/96 134/74 126/79 134/80  Pulse: 86 81 76 78  Resp: 16 13 12 12   Temp:      TempSrc:      SpO2: 94% 96% 94% 94%  Weight:      Height:        Intake/Output Summary (Last 24 hours) at 04/28/2018 0850 Last data filed at 04/28/2018 0800 Gross per 24 hour  Intake 4217.81 ml  Output 1625 ml  Net 2592.81 ml   Filed Weights   04/26/18 2337 04/27/18 0600 04/28/18 0600  Weight: 211 lb (95.7 kg) 208 lb 12.4 oz (94.7 kg) 215 lb 9.8 oz (97.8 kg)    Telemetry     NSR  - Personally Reviewed  ECG     NSR at 86.   NS ST abn in the lateral leads.  - Personally Reviewed  Physical Exam   GEN:   Middle age man,   Appears uncomfortable   Neck: No JVD Cardiac: RRR, no murmurs, rubs, or gallops.  Respiratory: Clear to auscultation bilaterally. GI:  distended ,  Mild tenderness MS: No edema; No deformity. Neuro:  Nonfocal  Psych: Normal  affect   Labs    Chemistry Recent Labs  Lab 04/26/18 2326 04/26/18 2345 04/27/18 0632 04/28/18 0224  NA 137 139 142 142  K 3.8 3.8 4.0 4.0  CL 112* 110 113* 113*  CO2 13*  --  22 25  GLUCOSE 113* 112* 123* 122*  BUN 35* 38* 29* 14  CREATININE 2.84* 2.90* 2.03* 1.41*  CALCIUM 8.0*  --  7.7* 8.0*  PROT 6.1*  --  5.8*  --   ALBUMIN 3.5  --  3.2*  --   AST 23  --  22  --   ALT 24  --  22  --   ALKPHOS 42  --  42  --   BILITOT 0.5  --  0.5  --   GFRNONAA 22*  --  33* 52*  GFRAA 26*  --  38* 60*  ANIONGAP 12  --  7 4*     Hematology Recent Labs  Lab 04/26/18 2326 04/26/18 2345 04/27/18 0632 04/28/18 0224  WBC 6.1  --  4.9 4.5  RBC 4.63  --  4.43 4.22  HGB 13.8 14.3 13.1 12.5*  HCT  43.7 42.0 41.5 39.6  MCV 94.4  --  93.7 93.8  MCH 29.8  --  29.6 29.6  MCHC 31.6  --  31.6 31.6  RDW 14.2  --  14.0 14.4  PLT 197  --  178 179    Cardiac Enzymes Recent Labs  Lab 04/27/18 0632 04/27/18 1249 04/27/18 1852 04/28/18 0224  TROPONINI <0.03 <0.03 <0.03 <0.03    Recent Labs  Lab 04/26/18 2343  TROPIPOC 0.01     BNPNo results for input(s): BNP, PROBNP in the last 168 hours.   DDimer No results for input(s): DDIMER in the last 168 hours.   Radiology    Ct Abdomen Pelvis Wo Contrast  Result Date: 04/27/2018 CLINICAL DATA:  Acute generalized abdominal pain. EXAM: CT ABDOMEN AND PELVIS WITHOUT CONTRAST TECHNIQUE: Multidetector CT imaging of the abdomen and pelvis was performed following the standard protocol without IV contrast. COMPARISON:  03/13/2017 FINDINGS: Lower chest: Atelectasis in the lung bases. Coronary artery calcifications. Hepatobiliary: No focal liver abnormality is seen. No gallstones, gallbladder wall thickening, or biliary dilatation. Pancreas: Unremarkable. No pancreatic ductal dilatation or surrounding inflammatory changes. Spleen: Normal in size without focal abnormality. Adrenals/Urinary Tract: Adrenal glands are unremarkable. Kidneys are normal,  without renal calculi, focal lesion, or hydronephrosis. Bladder is unremarkable. Stomach/Bowel: Stomach, small bowel, and colon are not abnormally distended. No wall thickening is appreciated although under distention limits evaluation. No inflammatory infiltration. Scattered diverticula in the colon. No evidence of diverticulitis. Appendix is normal. Vascular/Lymphatic: No significant vascular findings are present. No enlarged abdominal or pelvic lymph nodes. Reproductive: Prostate gland is not enlarged. Other: No free air or free fluid in the abdomen. Abdominal wall musculature appears intact. Postoperative changes in the left inguinal region suggesting previous hernia repair. There patchy areas of infiltration and emphysema in the subcutaneous fat of the anterior abdominal wall. These likely represent subcutaneous injection sites. Surface contusion or laceration could also have this appearance in the appropriate clinical setting. Musculoskeletal: Degenerative changes in the lumbar spine. No destructive bone lesions. IMPRESSION: 1. No evidence of bowel obstruction or inflammation. 2. Atelectasis in the lung bases. 3. Coronary artery calcifications. 4. Patchy areas of infiltration and emphysema in the subcutaneous fat of the anterior abdominal wall, likely representing subcutaneous injection sites. Electronically Signed   By: Burman Nieves M.D.   On: 04/27/2018 01:30   Ct Head Wo Contrast  Result Date: 04/27/2018 CLINICAL DATA:  Altered level of consciousness. EXAM: CT HEAD WITHOUT CONTRAST TECHNIQUE: Contiguous axial images were obtained from the base of the skull through the vertex without intravenous contrast. COMPARISON:  CT head 07/31/2017.  MRI brain 05/15/2017. FINDINGS: Brain: No evidence of acute infarction, hemorrhage, hydrocephalus, extra-axial collection or mass lesion/mass effect. Mild diffuse cerebral atrophy. Vascular: No hyperdense vessel or unexpected calcification. Skull: Normal. Negative  for fracture or focal lesion. Sinuses/Orbits: No acute finding. Other: None. IMPRESSION: No acute intracranial abnormalities.  Mild cerebral atrophy. Electronically Signed   By: Burman Nieves M.D.   On: 04/27/2018 01:25   Dg Chest Portable 1 View  Result Date: 04/26/2018 CLINICAL DATA:  Unresponsive, abdominal and leg pain EXAM: PORTABLE CHEST 1 VIEW COMPARISON:  Portable exam 2331 hours compared to 03/12/2017 FINDINGS: External pacing leads project over chest. Normal heart size, mediastinal contours, and pulmonary vascularity. Low lung volumes with bibasilar atelectasis. No definite infiltrate, pleural effusion or pneumothorax. Cervical spine fusion. IMPRESSION: Low lung volumes with bibasilar atelectasis. Electronically Signed   By: Ulyses Southward M.D.   On: 04/26/2018 23:43  Dg Abd Portable 1v  Result Date: 04/28/2018 CLINICAL DATA:  62 year old male with a history of abdominal pain EXAM: PORTABLE ABDOMEN - 1 VIEW COMPARISON:  04/27/2018 FINDINGS: Interval removal of the gastric tube. Interval removal of defibrillator pads. Gas within stomach, small bowel, colon. No abnormal distention. Gas extends to the rectum. No unexpected radiopaque foreign body. No unexpected soft tissue density. No unexpected calcification. No acute displaced fracture IMPRESSION: Nonobstructive bowel gas pattern. Electronically Signed   By: Gilmer Mor D.O.   On: 04/28/2018 08:43   Dg Abd Portable 1 View  Result Date: 04/27/2018 CLINICAL DATA:  NG tube placement EXAM: PORTABLE ABDOMEN - 1 VIEW COMPARISON:  04/26/2018 FINDINGS: Elevation of the left diaphragm with atelectasis at the left base. Esophageal tube tip overlies the gastric body. Mild diffuse increased bowel gas. IMPRESSION: Esophageal tube tip overlies the gastric body Electronically Signed   By: Jasmine Pang M.D.   On: 04/27/2018 03:15   Dg Abd Portable 1 View  Result Date: 04/26/2018 CLINICAL DATA:  Abdominal pain, unresponsive EXAM: PORTABLE ABDOMEN - 1 VIEW  COMPARISON:  Portable exam 2331 hours compared to CT abdomen and pelvis of 03/13/2017 FINDINGS: Gaseous distention of stomach. Large and small bowel gas pattern normal. No bowel dilatation or bowel wall thickening. Degenerative changes at lower lumbar spine. IMPRESSION: Gaseous distention of stomach. Electronically Signed   By: Ulyses Southward M.D.   On: 04/26/2018 23:44    Cardiac Studies     Patient Profile     62 y.o. male with history of coronary artery disease.  He was admitted with abdominal pain, bradycardia and worsening renal function. Now describes some chest pain this morning that was relieved with 3 sublingual nitroglycerin.  Assessment & Plan    1.  Chest discomfort: Patient had episodes of chest discomfort this morning.  He states that these were very similar to his previous episodes of chest pain.  They were relieved eventually with 3 sublingual nitroglycerin.  He has chronic T wave and abnormalities in the lateral leads.  There are no new acute changes on the EKG. His renal function has improved some from admission but is not back down to his baseline yet.  I would like to start him on heparin.  Continue IV fluids.   He has already eaten breakfast this morning.  We will not be able to do a stress test today .  Repeat labs tomorrow .  Consider cath - especially if he has any further episodes of CP    For questions or updates, please contact [facility] Please consult [email or web address] for contact info under Cardiology/STEMI.      Signed, Kristeen Miss, MD  04/28/2018, 8:50 AM

## 2018-04-28 NOTE — Progress Notes (Signed)
  Echocardiogram 2D Echocardiogram has been performed.  Celene Skeen 04/28/2018, 9:56 AM

## 2018-04-28 NOTE — Telephone Encounter (Signed)
Patient called to cancel his appt for tomorrow because he is in the hospital. Patient states he will call back and reschedule his appt.

## 2018-04-28 NOTE — Consult Note (Addendum)
[facility] Consult Note  Russell Carter 04-30-1956  [id number].    Requesting MD: Lala Lund Chief Complaint/Reason for Consult: abdominal pain  HPI:  Russell Carter is a 62yo male PMH CAD and PE on coumadin, who was admitted to [facility] 7/6 with acute onset abdominal pain. Patient states that Saturday evening he started having RUQ and epigastric abdominal pain. Pain constant and severe, gradually worsening. Worse with PO intake. Denies nausea, vomiting, fever, chills, diarrhea. States that he had never had pain like this before.  EMS was called and patient found unresponsive requiring external pacing in the field. On arrival to the emergency department, he was no longer on external pacing support, hypotensive and on nonrebreather mask.  He was responsive only to painful stimuli but improved to be responsive to verbal stimuli.  In the emergency department, the patient was observed to be in complete heart block. Cardiology is following. No new acute changes on EKG and troponins are negative. Considering cath if any further episodes of chest pain.  Today patient does continue to have intermittent upper abdominal discomfort. Worse with PO intake. Denies n/v. General surgery asked to see for possible cholecystitis.  -PMH significant for CAD s/p DES to RCA 02/2016, HTN, gout, HTN, HLD, GERD, PE on coumadin, H/o multiple TIAs -Abdominal surgical history: bilateral inguinal hernia repairs -Anticoagulants: coumadin/lovenox -Nonsmoker -Employment: retired  ROS: Review of Systems  HENT: Negative.   Eyes: Negative.   Respiratory: Negative.   Cardiovascular: Negative.   Gastrointestinal: Positive for abdominal pain. Negative for constipation, diarrhea, nausea and vomiting.  Genitourinary: Negative.   Musculoskeletal: Positive for joint pain.  Skin: Negative.   Neurological: Negative.    All systems reviewed and otherwise negative except for as above  Family History  Problem  Relation Age of Onset  . Heart attack Mother        x7. Still alive  . Heart attack Father        deceased b/c of MI  . Heart attack Brother        older, deceased b/c of MI   . Cancer Brother        bone cancer in 1 brother, ? brain cancer in other brother    Past Medical History:  Diagnosis Date  . Anginal pain ([place])   . Anxiety   . Arthritis   . Bipolar disorder ([place])   . Blood in stool   . CAD (coronary artery disease)    a. 2011: cath showing mod non-obstructive disease b. 02/2016: cath with 95% stenosis in the Mid RCA. DES placed.  . Cervical stenosis of spine   . Cocaine abuse ([place])    history  . Depression   . Diabetes mellitus without complication ([place])   . Gout, unspecified   . Headache   . HTN (hypertension)   . Hypercholesterolemia   . Hyperglycemia   . Migraines   . Myocardial infarction [facility]) 2008   drug cocaine and marijuana at time of mi none  since    . Pneumonia   . Rectal bleeding   . Renal insufficiency   . Seizures ([place])   . Stroke [facility])     Past Surgical History:  Procedure Laterality Date  . ANTERIOR CERVICAL DECOMP/DISCECTOMY FUSION N/A 01/24/2017   Procedure: Cervical Four-Five,Cervical Five Six,Cervical Six-Seven Anterior cervical discectomy with fusion and plate with Cervical Three-Thoracic- One Laminectomy/Foraminotomy with Cervical Three-Thoracic- One Fixation and fusion;  Surgeon: Kevan Ny Ditty, MD;  Location: [place];  Service: Neurosurgery;  Laterality:  N/A;  Part-1 anterior approach  . CARDIAC CATHETERIZATION  2008; ~ 2011   [place]-40%lad  . CARDIAC CATHETERIZATION N/A 03/16/2016   Procedure: Left Heart Cath and Coronary Angiography;  Surgeon: Leonie Man, MD;  Location: [place] CV LAB;  Service: Cardiovascular;  Laterality: N/A;  . COLONOSCOPY WITH PROPOFOL N/A 04/17/2018   Procedure: COLONOSCOPY WITH PROPOFOL;  Surgeon: Wonda Horner, MD;  Location: [facility] ENDOSCOPY;  Service: Endoscopy;  Laterality: N/A;  . CORONARY  ANGIOPLASTY     STENTS  . EYE SURGERY Bilateral    "for double vision"  . INGUINAL HERNIA REPAIR  05/12/2012   Procedure: HERNIA REPAIR INGUINAL ADULT;  Surgeon: Zenovia Jarred, MD;  Location: [place];  Service: General;  Laterality: Right;  . INGUINAL HERNIA REPAIR Left 02/02/2003   Dr. Georganna Skeans. repair with mesh. Same procedure done again in  05/22/2004   . INGUINAL HERNIA REPAIR Left ?date 2nd OR  . POSTERIOR CERVICAL FUSION/FORAMINOTOMY N/A 01/24/2017   Procedure: Cervical Three-Thoracic One-Laminectomy/Foraminotomy with Cervical Three-Thoracic One Fixation and fusion;  Surgeon: Kevan Ny Ditty, MD;  Location: [place];  Service: Neurosurgery;  Laterality: N/A;  Part-2 Posterior approach  . RECONSTRUCT / STABILIZE DISTAL ULNA    . SHOULDER OPEN ROTATOR CUFF REPAIR Left 2010  . SHOULDER SURGERY Left    "injured on the job; had to cut small bone out"  . THUMB FUSION Right     Social History:  reports that he has never smoked. He has never used smokeless tobacco. He reports that he drinks alcohol. He reports that he has current or past drug history. Drugs: "Crack" cocaine, Marijuana, and Cocaine.  Allergies:  Allergies  Allergen Reactions  . Shellfish Allergy Anaphylaxis    Medications Prior to Admission  Medication Sig Dispense Refill  . allopurinol (ZYLOPRIM) 300 MG tablet Take 1 tablet (300 mg total) by mouth daily. 90 tablet 6  . ALPRAZolam (XANAX) 0.5 MG tablet Take 1 tablet (0.5 mg total) by mouth 2 (two) times daily as needed for anxiety. 20 tablet 0  . CALCIUM-VITAMIN D PO Take 1 tablet by mouth daily.    . clotrimazole (LOTRIMIN) 1 % cream Apply 1 application topically 2 (two) times daily.    . divalproex (DEPAKOTE) 250 MG DR tablet Take 250 mg by mouth 3 (three) times daily.    . DULoxetine (CYMBALTA) 30 MG capsule Take 1 capsule (30 mg total) by mouth 2 (two) times daily. 60 capsule 3  . warfarin (COUMADIN) 5 MG tablet Take 1 tablet (5 mg total) by  mouth daily. (Patient taking differently: Take 2.5-5 mg by mouth See admin instructions. Take one tablet (5 mg) by mouth on Monday, Wednesday, Friday morning, take 1/2 tablet (2.5 mg) on Sunday, Tuesday, Thursday, Saturday) 30 tablet 3  . enoxaparin (LOVENOX) 100 MG/ML injection Inject 1 mL (100 mg total) into the skin every 12 (twelve) hours. 20 Syringe 0  . gabapentin (NEURONTIN) 400 MG capsule Take 1 capsule (400 mg total) 2 (two) times daily by mouth. 180 capsule 3  . gabapentin (NEURONTIN) 600 MG tablet Take 600 mg by mouth 3 (three) times daily.  0  . hydrOXYzine (ATARAX/VISTARIL) 25 MG tablet TAKE 1 TABLET BY MOUTH EVERY 6 HOURS AS NEEDED FOR ANXIETY 30 tablet 0  . ibuprofen (ADVIL,MOTRIN) 600 MG tablet Take 600 mg by mouth 2 (two) times daily as needed. Take with food  0  . ibuprofen (ADVIL,MOTRIN) 800 MG tablet Take 1 tablet (800 mg total) by  mouth every 8 (eight) hours as needed. 30 tablet 0  . losartan (COZAAR) 100 MG tablet TAKE 1 TABLET BY MOUTH DAILY. 30 tablet 0  . meclizine (ANTIVERT) 25 MG tablet 1 tab PO daily x 1 week then daily PRN 30 tablet 2  . methocarbamol (ROBAXIN) 750 MG tablet TAKE 1 TABLET BY MOUTH EVERY 8 HOURS AS NEEDED FOR MUSCLE SPASMS. 90 tablet 1  . metoCLOPramide (REGLAN) 10 MG tablet TAKE 1 TABLET BY MOUTH EVERY 6 HOURS AS NEEDED FOR NAUSEA OR HEADACHE. 10 tablet 0  . nitroGLYCERIN (NITROSTAT) 0.4 MG SL tablet Place 1 tablet (0.4 mg total) under the tongue every 5 (five) minutes as needed for chest pain. (Patient not taking: Reported on 04/11/2018) 25 tablet 4  . nitroGLYCERIN (NITROSTAT) 0.4 MG SL tablet PLACE 1 TABLET UNDER THE TONGUE EVERY 5 MINUTES AS NEEDED FOR CHEST PAIN 25 tablet 3  . omeprazole (PRILOSEC) 20 MG capsule Take 20 mg by mouth daily.  3  . oxyCODONE HCl 7.5 MG TABA Take 7.5 mg by mouth 3 (three) times daily.     . pantoprazole (PROTONIX) 40 MG tablet Take 1 tablet (40 mg total) by mouth at bedtime. 30 tablet 1  . rosuvastatin (CRESTOR) 40 MG  tablet Take 1 tablet (40 mg total) at bedtime by mouth. 30 tablet 2    Prior to Admission medications   Medication Sig Start Date End Date Taking? Authorizing Provider  allopurinol (ZYLOPRIM) 300 MG tablet Take 1 tablet (300 mg total) by mouth daily. 05/02/17  Yes Tresa Garter, MD  ALPRAZolam (XANAX) 0.5 MG tablet Take 1 tablet (0.5 mg total) by mouth 2 (two) times daily as needed for anxiety. 12/12/17  Yes Tresa Garter, MD  CALCIUM-VITAMIN D PO Take 1 tablet by mouth daily.   Yes [provider]  clotrimazole (LOTRIMIN) 1 % cream Apply 1 application topically 2 (two) times daily.   Yes [provider]  divalproex (DEPAKOTE) 250 MG DR tablet Take 250 mg by mouth 3 (three) times daily.   Yes [provider]  DULoxetine (CYMBALTA) 30 MG capsule Take 1 capsule (30 mg total) by mouth 2 (two) times daily. 11/20/17  Yes Argentina Donovan, PA-C  warfarin (COUMADIN) 5 MG tablet Take 1 tablet (5 mg total) by mouth daily. Patient taking differently: Take 2.5-5 mg by mouth See admin instructions. Take one tablet (5 mg) by mouth on Monday, Wednesday, Friday morning, take 1/2 tablet (2.5 mg) on Sunday, Tuesday, Thursday, Saturday 01/29/18  Yes Jegede, Olugbemiga E, MD  enoxaparin (LOVENOX) 100 MG/ML injection Inject 1 mL (100 mg total) into the skin every 12 (twelve) hours. 04/23/18   Ladell Pier, MD  gabapentin (NEURONTIN) 400 MG capsule Take 1 capsule (400 mg total) 2 (two) times daily by mouth. 09/04/17   Jegede, Marlena Clipper, MD  gabapentin (NEURONTIN) 600 MG tablet Take 600 mg by mouth 3 (three) times daily. 07/24/17   [provider]  hydrOXYzine (ATARAX/VISTARIL) 25 MG tablet TAKE 1 TABLET BY MOUTH EVERY 6 HOURS AS NEEDED FOR ANXIETY 02/05/18   Charlott Rakes, MD  ibuprofen (ADVIL,MOTRIN) 600 MG tablet Take 600 mg by mouth 2 (two) times daily as needed. Take with food 04/17/18   [provider]  ibuprofen (ADVIL,MOTRIN) 800 MG tablet Take 1  tablet (800 mg total) by mouth every 8 (eight) hours as needed. 11/20/17   Argentina Donovan, PA-C  losartan (COZAAR) 100 MG tablet TAKE 1 TABLET BY MOUTH DAILY. 04/07/18  Ladell Pier, MD  meclizine (ANTIVERT) 25 MG tablet 1 tab PO daily x 1 week then daily PRN 03/04/18   Ladell Pier, MD  methocarbamol (ROBAXIN) 750 MG tablet TAKE 1 TABLET BY MOUTH EVERY 8 HOURS AS NEEDED FOR MUSCLE SPASMS. 02/13/18   Charlott Rakes, MD  metoCLOPramide (REGLAN) 10 MG tablet TAKE 1 TABLET BY MOUTH EVERY 6 HOURS AS NEEDED FOR NAUSEA OR HEADACHE. 04/22/18   Ladell Pier, MD  nitroGLYCERIN (NITROSTAT) 0.4 MG SL tablet Place 1 tablet (0.4 mg total) under the tongue every 5 (five) minutes as needed for chest pain. Patient not taking: Reported on 04/11/2018 05/02/17   Tresa Garter, MD  nitroGLYCERIN (NITROSTAT) 0.4 MG SL tablet PLACE 1 TABLET UNDER THE TONGUE EVERY 5 MINUTES AS NEEDED FOR CHEST PAIN 01/13/18   Isaiah Serge, NP  omeprazole (PRILOSEC) 20 MG capsule Take 20 mg by mouth daily. 01/07/18   [provider]  oxyCODONE HCl 7.5 MG TABA Take 7.5 mg by mouth 3 (three) times daily.     [provider]  pantoprazole (PROTONIX) 40 MG tablet Take 1 tablet (40 mg total) by mouth at bedtime. 12/04/17   Tresa Garter, MD  rosuvastatin (CRESTOR) 40 MG tablet Take 1 tablet (40 mg total) at bedtime by mouth. 09/04/17 04/11/18  Tresa Garter, MD  famotidine (PEPCID) 20 MG tablet Take 1 tablet (20 mg total) by mouth 2 (two) times daily. 12/04/11 12/30/11  Abran Richard, PA-C  TACRINE HYDROCHLORIDE PO Take 25 mg by mouth daily.    12/30/11  [provider]    Blood pressure 128/85, pulse 74, temperature 98.3 F (36.8 C), temperature source Oral, resp. rate 11, height '5\' 11"'$  (1.803 m), weight 215 lb 9.8 oz (97.8 kg), SpO2 100 %. Physical Exam: General: pleasant, WD/WN AA male who is laying in bed in NAD HEENT: head is normocephalic, atraumatic.  Sclera are  noninjected.  Pupils equal and round.  Ears and nose without any masses or lesions.  Mouth is pink and moist. Dentition poor Heart: regular, rate, and rhythm.  No obvious murmurs, gallops, or rubs noted.  Palpable pedal pulses bilaterally Lungs: CTAB, no wheezes, rhonchi, or rales noted.  Respiratory effort nonlabored Abd: soft, mild distension, +BS, no masses, hernias, or organomegaly. TTP RUQ, epigastric region, and RLQ with voluntary guarding and no rebound tenderness MS: all 4 extremities are symmetrical with no cyanosis, clubbing, or edema. Skin: warm and dry with no masses, lesions, or rashes Psych: A&Ox3 with an appropriate affect. Neuro: cranial nerves grossly intact, extremity CSM intact bilaterally, normal speech  Results for orders placed or performed during the hospital encounter of 04/26/18 (from the past 48 hour(s))  CBC with Differential/Platelet     Status: None   Collection Time: 04/26/18 11:26 PM  Result Value Ref Range   WBC 6.1 4.0 - 10.5 K/uL   RBC 4.63 4.22 - 5.81 MIL/uL   Hemoglobin 13.8 13.0 - 17.0 g/dL   HCT 43.7 39.0 - 52.0 %   MCV 94.4 78.0 - 100.0 fL   MCH 29.8 26.0 - 34.0 pg   MCHC 31.6 30.0 - 36.0 g/dL   RDW 14.2 11.5 - 15.5 %   Platelets 197 150 - 400 K/uL   Neutrophils Relative % 42 %   Neutro Abs 2.5 1.7 - 7.7 K/uL   Lymphocytes Relative 44 %   Lymphs Abs 2.7 0.7 - 4.0 K/uL   Monocytes Relative 13 %   Monocytes Absolute 0.8 0.1 -  1.0 K/uL   Eosinophils Relative 1 %   Eosinophils Absolute 0.0 0.0 - 0.7 K/uL   Basophils Relative 0 %   Basophils Absolute 0.0 0.0 - 0.1 K/uL   Immature Granulocytes 0 %   Abs Immature Granulocytes 0.0 0.0 - 0.1 K/uL    Comment: Performed at [facility] [address], [place], [place] [address]  Comprehensive metabolic panel     Status: Abnormal   Collection Time: 04/26/18 11:26 PM  Result Value Ref Range   Sodium 137 135 - 145 mmol/L   Potassium 3.8 3.5 - 5.1 mmol/L   Chloride 112 (H) 98 - 111 mmol/L     Comment: Please note change in reference range.   CO2 13 (L) 22 - 32 mmol/L   Glucose, Bld 113 (H) 70 - 99 mg/dL    Comment: Please note change in reference range.   BUN 35 (H) 8 - 23 mg/dL    Comment: Please note change in reference range.   Creatinine, Ser 2.84 (H) 0.61 - 1.24 mg/dL   Calcium 8.0 (L) 8.9 - 10.3 mg/dL   Total Protein 6.1 (L) 6.5 - 8.1 g/dL   Albumin 3.5 3.5 - 5.0 g/dL   AST 23 15 - 41 U/L   ALT 24 0 - 44 U/L    Comment: Please note change in reference range.   Alkaline Phosphatase 42 38 - 126 U/L   Total Bilirubin 0.5 0.3 - 1.2 mg/dL   GFR calc non Af Amer 22 (L) >60 mL/min   GFR calc Af Amer 26 (L) >60 mL/min    Comment: (NOTE) The eGFR has been calculated using the CKD EPI equation. This calculation has not been validated in all clinical situations. eGFR's persistently <60 mL/min signify possible Chronic Kidney Disease.    Anion gap 12 5 - 15    Comment: Performed at [facility] [address], [place], [place] [address]  Protime-INR     Status: Abnormal   Collection Time: 04/26/18 11:26 PM  Result Value Ref Range   Prothrombin Time 22.3 (H) 11.4 - 15.2 seconds   INR 1.98     Comment: Performed at [facility] [address], [place], [place] [address]  Troponin I     Status: None   Collection Time: 04/26/18 11:26 PM  Result Value Ref Range   Troponin I <0.03 <0.03 ng/mL    Comment: Performed at [facility] [address] [place], [place] [address]  CK     Status: Abnormal   Collection Time: 04/26/18 11:26 PM  Result Value Ref Range   Total CK 544 (H) 49 - 397 U/L    Comment: Performed at [facility], [place] [address], [place], [place] [address]  POC occult blood, ED     Status: None   Collection Time: 04/26/18 11:34 PM  Result Value Ref Range   Fecal Occult Bld NEGATIVE NEGATIVE  I-Stat Troponin, ED (not at [facility])     Status: None   Collection Time: 04/26/18 11:43 PM  Result Value Ref Range   Troponin i, poc 0.01 0.00 -  0.08 ng/mL   Comment 3            Comment: Due to the release kinetics of cTnI, a negative result within the first hours of the onset of symptoms does not rule out myocardial infarction with certainty. If myocardial infarction is still suspected, repeat the test at appropriate intervals.  I-Stat Chem 8, ED     Status: Abnormal   Collection Time: 04/26/18 11:45 PM  Result Value Ref Range   Sodium 139 135 - 145 mmol/L   Potassium 3.8 3.5 - 5.1 mmol/L   Chloride 110 98 - 111 mmol/L   BUN 38 (H) 8 - 23 mg/dL   Creatinine, Ser 2.90 (H) 0.61 - 1.24 mg/dL   Glucose, Bld 112 (H) 70 - 99 mg/dL   Calcium, Ion 1.11 (L) 1.15 - 1.40 mmol/L   TCO2 18 (L) 22 - 32 mmol/L   Hemoglobin 14.3 13.0 - 17.0 g/dL   HCT 42.0 39.0 - 52.0 %  I-Stat CG4 Lactic Acid, ED     Status: Abnormal   Collection Time: 04/26/18 11:45 PM  Result Value Ref Range   Lactic Acid, Venous 3.60 (HH) 0.5 - 1.9 mmol/L   Comment NOTIFIED PHYSICIAN   I-Stat Arterial Blood Gas, ED - (order at [facility] and [facility] only)     Status: Abnormal   Collection Time: 04/27/18 12:05 AM  Result Value Ref Range   pH, Arterial 7.239 (L) 7.350 - 7.450   pCO2 arterial 37.5 32.0 - 48.0 mmHg   pO2, Arterial 96.0 83.0 - 108.0 mmHg   Bicarbonate 16.1 (L) 20.0 - 28.0 mmol/L   TCO2 17 (L) 22 - 32 mmol/L   O2 Saturation 96.0 %   Acid-base deficit 11.0 (H) 0.0 - 2.0 mmol/L   Patient temperature 97.9 F    Sample type ARTERIAL   Urinalysis, Routine w reflex microscopic     Status: Abnormal   Collection Time: 04/27/18  2:48 AM  Result Value Ref Range   Color, Urine YELLOW YELLOW   APPearance HAZY (A) CLEAR   Specific Gravity, Urine 1.009 1.005 - 1.030   pH 5.0 5.0 - 8.0   Glucose, UA NEGATIVE NEGATIVE mg/dL   Hgb urine dipstick NEGATIVE NEGATIVE   Bilirubin Urine NEGATIVE NEGATIVE   Ketones, ur NEGATIVE NEGATIVE mg/dL   Protein, ur NEGATIVE NEGATIVE mg/dL   Nitrite NEGATIVE NEGATIVE   Leukocytes, UA NEGATIVE NEGATIVE    Comment: Performed at [facility], [name] [address] [place], [place] [address]  Rapid urine drug screen (hospital performed)     Status: Abnormal   Collection Time: 04/27/18  2:49 AM  Result Value Ref Range   Opiates NONE DETECTED NONE DETECTED   Cocaine NONE DETECTED NONE DETECTED   Benzodiazepines NONE DETECTED NONE DETECTED   Amphetamines NONE DETECTED NONE DETECTED   Tetrahydrocannabinol NONE DETECTED NONE DETECTED   Barbiturates (A) NONE DETECTED    Result not available. Reagent lot number recalled by manufacturer.    Comment: Performed at [facility], [place] [address], [place], [place] [address]  Ethanol     Status: None   Collection Time: 04/27/18  3:16 AM  Result Value Ref Range   Alcohol, Ethyl (B) <10 <10 mg/dL    Comment: (NOTE) Lowest detectable limit for serum alcohol is 10 mg/dL. For medical purposes only. Performed at [facility], [place] [address] [place], [place] [address]   Acetaminophen level     Status: Abnormal   Collection Time: 04/27/18  3:16 AM  Result Value Ref Range   Acetaminophen (Tylenol), Serum <10 (L) 10 - 30 ug/mL    Comment: (NOTE) Therapeutic concentrations vary significantly. A range of 10-30 ug/mL  may be an effective concentration for many patients. However, some  are best treated at concentrations outside of this range. Acetaminophen concentrations >150  ug/mL at 4 hours after ingestion  and >50 ug/mL at 12 hours after ingestion are often associated with  toxic reactions. Performed at [facility], [place] [address] [place], [place] [address]   Salicylate level     Status: None   Collection Time: 04/27/18  3:16 AM  Result Value Ref Range   Salicylate Lvl <6.0 2.8 - 30.0 mg/dL    Comment: Performed at [facility] [address], [place], [place] [address]  Valproic acid level     Status: Abnormal   Collection Time: 04/27/18  3:16 AM  Result Value Ref Range   Valproic Acid Lvl 17 (L) 50.0 - 100.0 ug/mL    Comment: Performed at [facility] [address] [place], [place] [address]  Magnesium     Status: None   Collection Time: 04/27/18  3:16 AM  Result Value Ref Range   Magnesium 2.0 1.7 - 2.4 mg/dL    Comment: Performed at [facility] [address] [place], [place] [address]  Type and screen [place]     Status: None   Collection Time: 04/27/18  3:30 AM  Result Value Ref Range   ABO/RH(D) B POS    Antibody Screen NEG    Sample Expiration      04/30/2018 Performed at [facility], [place] [address], [place], [place] [address]   I-Stat CG4 Lactic Acid, ED     Status: None   Collection Time: 04/27/18  3:36 AM  Result Value Ref Range   Lactic Acid, Venous 1.25 0.5 - 1.9 mmol/L  MRSA PCR Screening     Status: None   Collection Time: 04/27/18  6:18 AM  Result Value Ref Range   MRSA by PCR NEGATIVE NEGATIVE    Comment:        The GeneXpert MRSA Assay (FDA approved for NASAL specimens only), is one component of a comprehensive MRSA colonization surveillance program. It is not intended to diagnose MRSA infection nor to guide or monitor treatment for MRSA infections. Performed at [facility], [place] [address] [place], [place] [address]   Procalcitonin - Baseline     Status: None   Collection Time: 04/27/18  6:32 AM  Result Value Ref Range   Procalcitonin 0.11 ng/mL    Comment:        Interpretation: PCT (Procalcitonin) <= 0.5 ng/mL: Systemic infection (sepsis) is not likely. Local bacterial infection is possible. (NOTE)       Sepsis PCT Algorithm           Lower Respiratory Tract                                      Infection PCT Algorithm    ----------------------------     ----------------------------         PCT < 0.25 ng/mL                PCT < 0.10 ng/mL         Strongly encourage             Strongly discourage   discontinuation of antibiotics    initiation of antibiotics    ----------------------------     -----------------------------       PCT 0.25 -  0.50 ng/mL            PCT 0.10 -  0.25 ng/mL               OR       >80% decrease in PCT            Discourage initiation of                                            antibiotics      Encourage discontinuation           of antibiotics    ----------------------------     -----------------------------         PCT >= 0.50 ng/mL              PCT 0.26 - 0.50 ng/mL               AND        <80% decrease in PCT             Encourage initiation of                                             antibiotics       Encourage continuation           of antibiotics    ----------------------------     -----------------------------        PCT >= 0.50 ng/mL                  PCT > 0.50 ng/mL               AND         increase in PCT                  Strongly encourage                                      initiation of antibiotics    Strongly encourage escalation           of antibiotics                                     -----------------------------                                           PCT <= 0.25 ng/mL                                                 OR                                        > 80% decrease in PCT  Discontinue / Do not initiate                                             antibiotics Performed at [facility], [place] [address], [place], [place] [address]   Basic metabolic panel     Status: Abnormal   Collection Time: 04/27/18  6:32 AM  Result Value Ref Range   Sodium 142 135 - 145 mmol/L   Potassium 4.0 3.5 - 5.1 mmol/L   Chloride 113 (H) 98 - 111 mmol/L    Comment: Please note change in reference range.   CO2 22 22 - 32 mmol/L   Glucose, Bld 123 (H) 70 - 99 mg/dL    Comment: Please note change in reference range.   BUN 29 (H) 8 - 23 mg/dL    Comment: Please note change in reference range.   Creatinine, Ser 2.03 (H) 0.61 - 1.24 mg/dL   Calcium 7.7 (L) 8.9 - 10.3 mg/dL   GFR calc non Af Amer 33 (L) >60 mL/min   GFR calc Af Amer 38 (L)  >60 mL/min    Comment: (NOTE) The eGFR has been calculated using the CKD EPI equation. This calculation has not been validated in all clinical situations. eGFR's persistently <60 mL/min signify possible Chronic Kidney Disease.    Anion gap 7 5 - 15    Comment: Performed at [facility] [address] [place], [place] [address]  Magnesium     Status: None   Collection Time: 04/27/18  6:32 AM  Result Value Ref Range   Magnesium 2.2 1.7 - 2.4 mg/dL    Comment: Performed at [facility] [address] [place], [place] [address]  Phosphorus     Status: None   Collection Time: 04/27/18  6:32 AM  Result Value Ref Range   Phosphorus 4.3 2.5 - 4.6 mg/dL    Comment: Performed at [facility] [address], [place] [address]  CBC     Status: None   Collection Time: 04/27/18  6:32 AM  Result Value Ref Range   WBC 4.9 4.0 - 10.5 K/uL   RBC 4.43 4.22 - 5.81 MIL/uL   Hemoglobin 13.1 13.0 - 17.0 g/dL   HCT 41.5 39.0 - 52.0 %   MCV 93.7 78.0 - 100.0 fL   MCH 29.6 26.0 - 34.0 pg   MCHC 31.6 30.0 - 36.0 g/dL   RDW 14.0 11.5 - 15.5 %   Platelets 178 150 - 400 K/uL    Comment: Performed at [facility], [place] [address], [place], [place] [address]  Troponin I (q 6hr x 3)     Status: None   Collection Time: 04/27/18  6:32 AM  Result Value Ref Range   Troponin I <0.03 <0.03 ng/mL    Comment: Performed at [facility] [address] [place], [place] [address]  HIV antibody (Routine Testing)     Status: None   Collection Time: 04/27/18  6:32 AM  Result Value Ref Range   HIV Screen 4th Generation wRfx Non Reactive Non Reactive    Comment: (NOTE) Performed At: [facility] [address] [place], [place] [id number] Rush Farmer MD [id number] Performed at [facility], [place] [address] [place], [place] [address]   Hepatic function panel     Status:  Abnormal   Collection Time: 04/27/18  6:32 AM  Result Value Ref Range   Total  Protein 5.8 (L) 6.5 - 8.1 g/dL   Albumin 3.2 (L) 3.5 - 5.0 g/dL   AST 22 15 - 41 U/L   ALT 22 0 - 44 U/L    Comment: Please note change in reference range.   Alkaline Phosphatase 42 38 - 126 U/L   Total Bilirubin 0.5 0.3 - 1.2 mg/dL   Bilirubin, Direct 0.1 0.0 - 0.2 mg/dL    Comment: Please note change in reference range.   Indirect Bilirubin 0.4 0.3 - 0.9 mg/dL    Comment: Performed at [facility], [place] [address], [place], [place] [address]  Glucose, capillary     Status: Abnormal   Collection Time: 04/27/18  6:38 AM  Result Value Ref Range   Glucose-Capillary 120 (H) 70 - 99 mg/dL   Comment 1 Capillary Specimen    Comment 2 Notify RN   I-STAT 3, arterial blood gas (G3+)     Status: Abnormal   Collection Time: 04/27/18  6:56 AM  Result Value Ref Range   pH, Arterial 7.298 (L) 7.350 - 7.450   pCO2 arterial 40.9 32.0 - 48.0 mmHg   pO2, Arterial 60.0 (L) 83.0 - 108.0 mmHg   Bicarbonate 20.2 20.0 - 28.0 mmol/L   TCO2 21 (L) 22 - 32 mmol/L   O2 Saturation 89.0 %   Acid-base deficit 6.0 (H) 0.0 - 2.0 mmol/L   Patient temperature 97.8 F    Collection site RADIAL, ALLEN'S TEST ACCEPTABLE    Drawn by Operator    Sample type ARTERIAL   Glucose, capillary     Status: Abnormal   Collection Time: 04/27/18  7:48 AM  Result Value Ref Range   Glucose-Capillary 107 (H) 70 - 99 mg/dL   Comment 1 Notify RN   Glucose, capillary     Status: Abnormal   Collection Time: 04/27/18 11:14 AM  Result Value Ref Range   Glucose-Capillary 112 (H) 70 - 99 mg/dL   Comment 1 Notify RN   TSH     Status: None   Collection Time: 04/27/18 12:49 PM  Result Value Ref Range   TSH 1.057 0.350 - 4.500 uIU/mL    Comment: Performed by a 3rd Generation assay with a functional sensitivity of <=0.01 uIU/mL. Performed at [facility], [place] [address], [place], [place] [address]   Troponin I     Status: None   Collection Time: 04/27/18 12:49 PM  Result Value Ref Range   Troponin I <0.03 <0.03 ng/mL     Comment: Performed at [facility] [address] [place], [place] [address]  Glucose, capillary     Status: Abnormal   Collection Time: 04/27/18  4:05 PM  Result Value Ref Range   Glucose-Capillary 124 (H) 70 - 99 mg/dL   Comment 1 Notify RN   Troponin I (q 6hr x 3)     Status: None   Collection Time: 04/27/18  6:52 PM  Result Value Ref Range   Troponin I <0.03 <0.03 ng/mL    Comment: Performed at [facility], [place] [address] [place], [place] [address]  Glucose, capillary     Status: Abnormal   Collection Time: 04/27/18  7:39 PM  Result Value Ref Range   Glucose-Capillary 123 (H) 70 - 99 mg/dL   Comment 1 Capillary Specimen   Glucose, capillary     Status: Abnormal  Collection Time: 04/28/18 12:39 AM  Result Value Ref Range   Glucose-Capillary 117 (H) 70 - 99 mg/dL   Comment 1 Capillary Specimen   Troponin I     Status: None   Collection Time: 04/28/18  2:24 AM  Result Value Ref Range   Troponin I <0.03 <0.03 ng/mL    Comment: Performed at [facility], [place] [address] [place], [place] [address]  Procalcitonin     Status: None   Collection Time: 04/28/18  2:24 AM  Result Value Ref Range   Procalcitonin <0.10 ng/mL    Comment:        Interpretation: PCT (Procalcitonin) <= 0.5 ng/mL: Systemic infection (sepsis) is not likely. Local bacterial infection is possible. (NOTE)       Sepsis PCT Algorithm           Lower Respiratory Tract                                      Infection PCT Algorithm    ----------------------------     ----------------------------         PCT < 0.25 ng/mL                PCT < 0.10 ng/mL         Strongly encourage             Strongly discourage   discontinuation of antibiotics    initiation of antibiotics    ----------------------------     -----------------------------       PCT 0.25 - 0.50 ng/mL            PCT 0.10 - 0.25 ng/mL               OR       >80% decrease in PCT            Discourage initiation of                                             antibiotics      Encourage discontinuation           of antibiotics    ----------------------------     -----------------------------         PCT >= 0.50 ng/mL              PCT 0.26 - 0.50 ng/mL               AND        <80% decrease in PCT             Encourage initiation of                                             antibiotics       Encourage continuation           of antibiotics    ----------------------------     -----------------------------        PCT >= 0.50 ng/mL                  PCT > 0.50 ng/mL  AND         increase in PCT                  Strongly encourage                                      initiation of antibiotics    Strongly encourage escalation           of antibiotics                                     -----------------------------                                           PCT <= 0.25 ng/mL                                                 OR                                        > 80% decrease in PCT                                     Discontinue / Do not initiate                                             antibiotics Performed at [facility], [address] [place], [place] [address]   Basic metabolic panel     Status: Abnormal   Collection Time: 04/28/18  2:24 AM  Result Value Ref Range   Sodium 142 135 - 145 mmol/L   Potassium 4.0 3.5 - 5.1 mmol/L   Chloride 113 (H) 98 - 111 mmol/L    Comment: Please note change in reference range.   CO2 25 22 - 32 mmol/L   Glucose, Bld 122 (H) 70 - 99 mg/dL    Comment: Please note change in reference range.   BUN 14 8 - 23 mg/dL    Comment: Please note change in reference range.   Creatinine, Ser 1.41 (H) 0.61 - 1.24 mg/dL   Calcium 8.0 (L) 8.9 - 10.3 mg/dL   GFR calc non Af Amer 52 (L) >60 mL/min   GFR calc Af Amer 60 (L) >60 mL/min    Comment: (NOTE) The eGFR has been calculated using the CKD EPI equation. This calculation has not been validated in all clinical  situations. eGFR's persistently <60 mL/min signify possible Chronic Kidney Disease.    Anion gap 4 (L) 5 - 15    Comment: Performed at [facility] [address] [place], [place] [address]  CBC     Status: Abnormal   Collection Time: 04/28/18  2:24 AM  Result Value Ref Range   WBC 4.5 4.0 - 10.5 K/uL  RBC 4.22 4.22 - 5.81 MIL/uL   Hemoglobin 12.5 (L) 13.0 - 17.0 g/dL   HCT 39.6 39.0 - 52.0 %   MCV 93.8 78.0 - 100.0 fL   MCH 29.6 26.0 - 34.0 pg   MCHC 31.6 30.0 - 36.0 g/dL   RDW 14.4 11.5 - 15.5 %   Platelets 179 150 - 400 K/uL    Comment: Performed at [facility], [place] [address], [place], [place] [address]  Magnesium     Status: None   Collection Time: 04/28/18  2:24 AM  Result Value Ref Range   Magnesium 2.0 1.7 - 2.4 mg/dL    Comment: Performed at [facility], [place] [address], [place], [place] [address]  Phosphorus     Status: None   Collection Time: 04/28/18  2:24 AM  Result Value Ref Range   Phosphorus 2.8 2.5 - 4.6 mg/dL    Comment: Performed at [facility] [address] [place], [place] [address]  Protime-INR     Status: Abnormal   Collection Time: 04/28/18  2:24 AM  Result Value Ref Range   Prothrombin Time 25.6 (H) 11.4 - 15.2 seconds   INR 2.36     Comment: Performed at [facility] [address], [place], [place] [address]  Glucose, capillary     Status: Abnormal   Collection Time: 04/28/18  4:00 AM  Result Value Ref Range   Glucose-Capillary 104 (H) 70 - 99 mg/dL   Comment 1 Capillary Specimen    Comment 2 Notify RN   Glucose, capillary     Status: Abnormal   Collection Time: 04/28/18  6:56 AM  Result Value Ref Range   Glucose-Capillary 103 (H) 70 - 99 mg/dL   Comment 1 Capillary Specimen   Hepatic function panel     Status: Abnormal   Collection Time: 04/28/18  8:32 AM  Result Value Ref Range   Total Protein 5.5 (L) 6.5 - 8.1 g/dL   Albumin 3.0 (L) 3.5 - 5.0 g/dL   AST 22 15 - 41 U/L   ALT 20 0 - 44 U/L     Comment: Please note change in reference range.   Alkaline Phosphatase 40 38 - 126 U/L   Total Bilirubin 0.5 0.3 - 1.2 mg/dL   Bilirubin, Direct 0.1 0.0 - 0.2 mg/dL    Comment: Please note change in reference range.   Indirect Bilirubin 0.4 0.3 - 0.9 mg/dL    Comment: Performed at [facility], [place] [address] [place], [place] [address]  Lipase, blood     Status: None   Collection Time: 04/28/18  8:32 AM  Result Value Ref Range   Lipase 26 11 - 51 U/L    Comment: Performed at [facility] [address], [place], [place] [address]  Troponin I (q 6hr x 3)     Status: None   Collection Time: 04/28/18  8:32 AM  Result Value Ref Range   Troponin I <0.03 <0.03 ng/mL    Comment: Performed at [facility] [address], [place], [place] [address]   Ct Abdomen Pelvis Wo Contrast  Result Date: 04/27/2018 CLINICAL DATA:  Acute generalized abdominal pain. EXAM: CT ABDOMEN AND PELVIS WITHOUT CONTRAST TECHNIQUE: Multidetector CT imaging of the abdomen and pelvis was performed following the standard protocol without IV contrast. COMPARISON:  03/13/2017 FINDINGS: Lower chest: Atelectasis in the lung bases. Coronary artery calcifications. Hepatobiliary: No focal liver abnormality is  seen. No gallstones, gallbladder wall thickening, or biliary dilatation. Pancreas: Unremarkable. No pancreatic ductal dilatation or surrounding inflammatory changes. Spleen: Normal in size without focal abnormality. Adrenals/Urinary Tract: Adrenal glands are unremarkable. Kidneys are normal, without renal calculi, focal lesion, or hydronephrosis. Bladder is unremarkable. Stomach/Bowel: Stomach, small bowel, and colon are not abnormally distended. No wall thickening is appreciated although under distention limits evaluation. No inflammatory infiltration. Scattered diverticula in the colon. No evidence of diverticulitis. Appendix is normal. Vascular/Lymphatic: No significant vascular findings are present. No  enlarged abdominal or pelvic lymph nodes. Reproductive: Prostate gland is not enlarged. Other: No free air or free fluid in the abdomen. Abdominal wall musculature appears intact. Postoperative changes in the left inguinal region suggesting previous hernia repair. There patchy areas of infiltration and emphysema in the subcutaneous fat of the anterior abdominal wall. These likely represent subcutaneous injection sites. Surface contusion or laceration could also have this appearance in the appropriate clinical setting. Musculoskeletal: Degenerative changes in the lumbar spine. No destructive bone lesions. IMPRESSION: 1. No evidence of bowel obstruction or inflammation. 2. Atelectasis in the lung bases. 3. Coronary artery calcifications. 4. Patchy areas of infiltration and emphysema in the subcutaneous fat of the anterior abdominal wall, likely representing subcutaneous injection sites. Electronically Signed   By: Lucienne Capers M.D.   On: 04/27/2018 01:30   Ct Head Wo Contrast  Result Date: 04/27/2018 CLINICAL DATA:  Altered level of consciousness. EXAM: CT HEAD WITHOUT CONTRAST TECHNIQUE: Contiguous axial images were obtained from the base of the skull through the vertex without intravenous contrast. COMPARISON:  CT head 07/31/2017.  MRI brain 05/15/2017. FINDINGS: Brain: No evidence of acute infarction, hemorrhage, hydrocephalus, extra-axial collection or mass lesion/mass effect. Mild diffuse cerebral atrophy. Vascular: No hyperdense vessel or unexpected calcification. Skull: Normal. Negative for fracture or focal lesion. Sinuses/Orbits: No acute finding. Other: None. IMPRESSION: No acute intracranial abnormalities.  Mild cerebral atrophy. Electronically Signed   By: Lucienne Capers M.D.   On: 04/27/2018 01:25   Dg Chest Portable 1 View  Result Date: 04/26/2018 CLINICAL DATA:  Unresponsive, abdominal and leg pain EXAM: PORTABLE CHEST 1 VIEW COMPARISON:  Portable exam 2331 hours compared to 03/12/2017  FINDINGS: External pacing leads project over chest. Normal heart size, mediastinal contours, and pulmonary vascularity. Low lung volumes with bibasilar atelectasis. No definite infiltrate, pleural effusion or pneumothorax. Cervical spine fusion. IMPRESSION: Low lung volumes with bibasilar atelectasis. Electronically Signed   By: Lavonia Dana M.D.   On: 04/26/2018 23:43   Dg Abd Portable 1v  Result Date: 04/28/2018 CLINICAL DATA:  62 year old male with a history of abdominal pain EXAM: PORTABLE ABDOMEN - 1 VIEW COMPARISON:  04/27/2018 FINDINGS: Interval removal of the gastric tube. Interval removal of defibrillator pads. Gas within stomach, small bowel, colon. No abnormal distention. Gas extends to the rectum. No unexpected radiopaque foreign body. No unexpected soft tissue density. No unexpected calcification. No acute displaced fracture IMPRESSION: Nonobstructive bowel gas pattern. Electronically Signed   By: Corrie Mckusick D.O.   On: 04/28/2018 08:43   Dg Abd Portable 1 View  Result Date: 04/27/2018 CLINICAL DATA:  NG tube placement EXAM: PORTABLE ABDOMEN - 1 VIEW COMPARISON:  04/26/2018 FINDINGS: Elevation of the left diaphragm with atelectasis at the left base. Esophageal tube tip overlies the gastric body. Mild diffuse increased bowel gas. IMPRESSION: Esophageal tube tip overlies the gastric body Electronically Signed   By: Donavan Foil M.D.   On: 04/27/2018 03:15   Dg Abd Portable 1 View  Result Date:  04/26/2018 CLINICAL DATA:  Abdominal pain, unresponsive EXAM: PORTABLE ABDOMEN - 1 VIEW COMPARISON:  Portable exam 2331 hours compared to CT abdomen and pelvis of 03/13/2017 FINDINGS: Gaseous distention of stomach. Large and small bowel gas pattern normal. No bowel dilatation or bowel wall thickening. Degenerative changes at lower lumbar spine. IMPRESSION: Gaseous distention of stomach. Electronically Signed   By: Lavonia Dana M.D.   On: 04/26/2018 23:44   Anti-infectives (From admission, onward)    Start     Dose/Rate Route Frequency Ordered Stop   04/27/18 0100  piperacillin-tazobactam (ZOSYN) IVPB 3.375 g  Status:  Discontinued     3.375 g 12.5 mL/hr over 240 Minutes Intravenous Every 8 hours 04/27/18 0029 04/27/18 0558   04/27/18 0100  vancomycin (VANCOCIN) IVPB 1000 mg/200 mL premix  Status:  Discontinued     1,000 mg 200 mL/hr over 60 Minutes Intravenous Every 24 hours 04/27/18 0029 04/27/18 0558        Assessment/Plan CAD s/p DES to RCA 02/2016 HTN Gout HLD GERD PE on coumadin - INR 2.36 today H/o multiple TIAs  Chest pain - cardiology following, EKG and troponins negative for acute changes. Considering cath if persistent CP  RUQ/epigastric abdominal pain - Concern for possible acute cholecystitis. Abdominal u/s and HIDA scan pending. Will continue to follow and make more recommendations following the results of these studies.  ID - zosyn/vanco x1 in ED 7/7 VTE - SCDs, starting heparin FEN - IVF,NPO Foley - none  [place] Hampshire, [facility] 04/28/2018, 11:16 AM Pager: [phone] Consults: [phone]  Agree with above.  CT scan shows no intraabdominal process. HIDA scan is negative - making acute gall bladder disease very unlikely as the source of pain. [place] still pending.  Will follow.  Alphonsa Overall, MD, [facility] Pager: [phone] Office phone:  [phone]

## 2018-04-28 NOTE — Progress Notes (Signed)
@IPLOG @        PROGRESS NOTE                                                                                                                                                                                                             Patient Demographics:    Russell Carter, is a 62 y.o. male, DOB - January 10, 1956, WUJ:[id number]  Admit date - 04/26/2018   Admitting Physician Marcelle Smiling, MD  Outpatient Primary MD for the patient is Marcine Matar, MD  LOS - 1  Chief Complaint  Patient presents with  . Altered Mental Status  . Abdominal Pain       Brief Narrative this is a 62 year old Caucasian male with history of CAD status post DES to RCA more than 1 year ago in 2017, essential hypertension, gout, GERD, dyslipidemia, PE on anticoagulation with Coumadin who developed nonspecific right upper quadrant abdominal pain 2 days ago was brought in by wife for feeling lethargic with abdominal pain and some somnolence.  In the ER he was found to be bradycardic.  He was admitted by pulmonary critical care and seen by cardiology.  He was transferred to my care on 04/28/2018 with ongoing right upper quadrant abdominal pain.   Subjective:    Russell Carter today has, No headache, he had his breakfast and soon thereafter developed right upper quadrant abdominal pain and some nonspecific chest pain, mild nausea, no shortness of breath.   Assessment  & Plan :     1.  Lethargy.  Likely due to bradycardia which in turn likely was caused by vagal response to abdominal pain, currently stable, TSH stable, cardiology on board monitor on telemetry.  2.  Ongoing right upper quadrant abdominal pain.  Seems to be the main issue here.  CT abdomen pelvis unremarkable, liver enzymes unremarkable, discussed with general surgery who will evaluate, and HIDA scan.  He does have positive Murphy's for now.  Bowel ischemia less likely but remains a possibility.  3. CAD status post DES to RCA in 2017.  Continue statin,  will add low-dose aspirin, INR currently therapeutic, some nonspecific chest pain this morning with nonacute EKG, negative troponin, cardiology on board.  4. H/O PE.  Currently on Coumadin INR therapeutic.  If needed will bridge with heparin.  5.  Dyslipidemia.  Continue statin.  6.  History of cocaine abuse.  Says does not do it anymore.  Tox screen negative.  Will monitor.  7. Questionable complete heart block.  Stable now.  Likely vagal response to  pain.  Cardiology on board.    8. DM type II.  On sliding scale.  CBG (last 3)  Recent Labs    04/28/18 0039 04/28/18 0400 04/28/18 0656  GLUCAP 117* 104* 103*    Diet :  Diet Order           Diet NPO time specified  Diet effective 0500 tomorrow        Diet NPO time specified Except for: Sips with Meds  Diet effective now           Family Communication  : None present   Code Status : Full  Disposition Plan  : Stepdown  Consults  : PCCM, cardiology, general surgery  Procedures  :    CT head.  Nonacute.    CT abdomen pelvis.  1. No evidence of bowel obstruction or inflammation. 2. Atelectasis in the lung bases. 3. Coronary artery calcifications. 4. Patchy areas of infiltration and emphysema in the subcutaneous fat of the anterior abdominal wall, likely representing subcutaneous injection sites.  Echocardiogram.     Right upper quadrant ultrasound.     HIDA.  DVT Prophylaxis  : Coumadin/SCDs  Lab Results  Component Value Date   PLT 179 04/28/2018   Lab Results  Component Value Date   INR 2.36 04/28/2018   INR 1.98 04/26/2018   INR 1.4 (A) 04/25/2018     Inpatient Medications  Scheduled Meds: . allopurinol  300 mg Oral Daily  . divalproex  250 mg Oral TID  . insulin aspart  0-15 Units Subcutaneous Q4H  . losartan  100 mg Oral Daily  . multivitamin with minerals  1 tablet Oral Daily  . oxyCODONE  7.5 mg Oral TID  . pantoprazole  40 mg Oral Daily  . rosuvastatin  40 mg Oral QHS   Continuous  Infusions: . sodium chloride    . lactated ringers 50 mL/hr at 04/28/18 0800   PRN Meds:.sodium chloride, morphine injection, nitroGLYCERIN  Antibiotics  :    Anti-infectives (From admission, onward)   Start     Dose/Rate Route Frequency Ordered Stop   04/27/18 0100  piperacillin-tazobactam (ZOSYN) IVPB 3.375 g  Status:  Discontinued     3.375 g 12.5 mL/hr over 240 Minutes Intravenous Every 8 hours 04/27/18 0029 04/27/18 0558   04/27/18 0100  vancomycin (VANCOCIN) IVPB 1000 mg/200 mL premix  Status:  Discontinued     1,000 mg 200 mL/hr over 60 Minutes Intravenous Every 24 hours 04/27/18 0029 04/27/18 0558         Objective:   Vitals:   04/28/18 0840 04/28/18 0845 04/28/18 0900 04/28/18 1000  BP: 134/80 136/79 135/82 110/79  Pulse: 78 82 74 79  Resp: 12 12 16 12   Temp:      TempSrc:      SpO2: 94% 97% 100% 98%  Weight:      Height:        Wt Readings from Last 3 Encounters:  04/28/18 97.8 kg (215 lb 9.8 oz)  04/17/18 95.7 kg (211 lb)  03/04/18 98.7 kg (217 lb 9.6 oz)     Intake/Output Summary (Last 24 hours) at 04/28/2018 1032 Last data filed at 04/28/2018 0800 Gross per 24 hour  Intake 4017.78 ml  Output 1625 ml  Net 2392.78 ml     Physical Exam  Awake Alert, Oriented X 3, No new F.N deficits, Normal affect Gem.AT,PERRAL Supple Neck,No JVD, No cervical lymphadenopathy appriciated.  Symmetrical Chest wall movement, Good air movement bilaterally, CTAB RRR,No Gallops,Rubs  or new Murmurs, No Parasternal Heave +ve B.Sounds, Abd Soft, +ve RUQ tenderness, No organomegaly appriciated, No rebound - guarding or rigidity. No Cyanosis, Clubbing or edema, No new Rash or bruise       Data Review:    CBC Recent Labs  Lab 04/26/18 2326 04/26/18 2345 04/27/18 0632 04/28/18 0224  WBC 6.1  --  4.9 4.5  HGB 13.8 14.3 13.1 12.5*  HCT 43.7 42.0 41.5 39.6  PLT 197  --  178 179  MCV 94.4  --  93.7 93.8  MCH 29.8  --  29.6 29.6  MCHC 31.6  --  31.6 31.6  RDW 14.2  --   14.0 14.4  LYMPHSABS 2.7  --   --   --   MONOABS 0.8  --   --   --   EOSABS 0.0  --   --   --   BASOSABS 0.0  --   --   --     Chemistries  Recent Labs  Lab 04/26/18 2326 04/26/18 2345 04/27/18 0316 04/27/18 0632 04/28/18 0224 04/28/18 0832  NA 137 139  --  142 142  --   K 3.8 3.8  --  4.0 4.0  --   CL 112* 110  --  113* 113*  --   CO2 13*  --   --  22 25  --   GLUCOSE 113* 112*  --  123* 122*  --   BUN 35* 38*  --  29* 14  --   CREATININE 2.84* 2.90*  --  2.03* 1.41*  --   CALCIUM 8.0*  --   --  7.7* 8.0*  --   MG  --   --  2.0 2.2 2.0  --   AST 23  --   --  22  --  22  ALT 24  --   --  22  --  20  ALKPHOS 42  --   --  42  --  40  BILITOT 0.5  --   --  0.5  --  0.5   ------------------------------------------------------------------------------------------------------------------ No results for input(s): CHOL, HDL, LDLCALC, TRIG, CHOLHDL, LDLDIRECT in the last 72 hours.  Lab Results  Component Value Date   HGBA1C 5.8 03/04/2018   ------------------------------------------------------------------------------------------------------------------ Recent Labs    04/27/18 1249  TSH 1.057   ------------------------------------------------------------------------------------------------------------------ No results for input(s): VITAMINB12, FOLATE, FERRITIN, TIBC, IRON, RETICCTPCT in the last 72 hours.  Coagulation profile Recent Labs  Lab 04/23/18 1037 04/25/18 0938 04/26/18 2326 04/28/18 0224  INR 1.2* 1.4* 1.98 2.36    No results for input(s): DDIMER in the last 72 hours.  Cardiac Enzymes Recent Labs  Lab 04/27/18 1852 04/28/18 0224 04/28/18 0832  TROPONINI <0.03 <0.03 <0.03   ------------------------------------------------------------------------------------------------------------------ No results found for: BNP  Micro Results Recent Results (from the past 240 hour(s))  MRSA PCR Screening     Status: None   Collection Time: 04/27/18  6:18 AM   Result Value Ref Range Status   MRSA by PCR NEGATIVE NEGATIVE Final    Comment:        The GeneXpert MRSA Assay (FDA approved for NASAL specimens only), is one component of a comprehensive MRSA colonization surveillance program. It is not intended to diagnose MRSA infection nor to guide or monitor treatment for MRSA infections. Performed at [facility], [address] [place], [place] [address]     Radiology Reports Ct Abdomen Pelvis Wo Contrast  Result Date: 04/27/2018 CLINICAL DATA:  Acute  generalized abdominal pain. EXAM: CT ABDOMEN AND PELVIS WITHOUT CONTRAST TECHNIQUE: Multidetector CT imaging of the abdomen and pelvis was performed following the standard protocol without IV contrast. COMPARISON:  03/13/2017 FINDINGS: Lower chest: Atelectasis in the lung bases. Coronary artery calcifications. Hepatobiliary: No focal liver abnormality is seen. No gallstones, gallbladder wall thickening, or biliary dilatation. Pancreas: Unremarkable. No pancreatic ductal dilatation or surrounding inflammatory changes. Spleen: Normal in size without focal abnormality. Adrenals/Urinary Tract: Adrenal glands are unremarkable. Kidneys are normal, without renal calculi, focal lesion, or hydronephrosis. Bladder is unremarkable. Stomach/Bowel: Stomach, small bowel, and colon are not abnormally distended. No wall thickening is appreciated although under distention limits evaluation. No inflammatory infiltration. Scattered diverticula in the colon. No evidence of diverticulitis. Appendix is normal. Vascular/Lymphatic: No significant vascular findings are present. No enlarged abdominal or pelvic lymph nodes. Reproductive: Prostate gland is not enlarged. Other: No free air or free fluid in the abdomen. Abdominal wall musculature appears intact. Postoperative changes in the left inguinal region suggesting previous hernia repair. There patchy areas of infiltration and emphysema in the subcutaneous fat of the  anterior abdominal wall. These likely represent subcutaneous injection sites. Surface contusion or laceration could also have this appearance in the appropriate clinical setting. Musculoskeletal: Degenerative changes in the lumbar spine. No destructive bone lesions. IMPRESSION: 1. No evidence of bowel obstruction or inflammation. 2. Atelectasis in the lung bases. 3. Coronary artery calcifications. 4. Patchy areas of infiltration and emphysema in the subcutaneous fat of the anterior abdominal wall, likely representing subcutaneous injection sites. Electronically Signed   By: Burman Nieves M.D.   On: 04/27/2018 01:30   Ct Head Wo Contrast  Result Date: 04/27/2018 CLINICAL DATA:  Altered level of consciousness. EXAM: CT HEAD WITHOUT CONTRAST TECHNIQUE: Contiguous axial images were obtained from the base of the skull through the vertex without intravenous contrast. COMPARISON:  CT head 07/31/2017.  MRI brain 05/15/2017. FINDINGS: Brain: No evidence of acute infarction, hemorrhage, hydrocephalus, extra-axial collection or mass lesion/mass effect. Mild diffuse cerebral atrophy. Vascular: No hyperdense vessel or unexpected calcification. Skull: Normal. Negative for fracture or focal lesion. Sinuses/Orbits: No acute finding. Other: None. IMPRESSION: No acute intracranial abnormalities.  Mild cerebral atrophy. Electronically Signed   By: Burman Nieves M.D.   On: 04/27/2018 01:25   Dg Chest Portable 1 View  Result Date: 04/26/2018 CLINICAL DATA:  Unresponsive, abdominal and leg pain EXAM: PORTABLE CHEST 1 VIEW COMPARISON:  Portable exam 2331 hours compared to 03/12/2017 FINDINGS: External pacing leads project over chest. Normal heart size, mediastinal contours, and pulmonary vascularity. Low lung volumes with bibasilar atelectasis. No definite infiltrate, pleural effusion or pneumothorax. Cervical spine fusion. IMPRESSION: Low lung volumes with bibasilar atelectasis. Electronically Signed   By: Ulyses Southward M.D.    On: 04/26/2018 23:43   Dg Abd Portable 1v  Result Date: 04/28/2018 CLINICAL DATA:  62 year old male with a history of abdominal pain EXAM: PORTABLE ABDOMEN - 1 VIEW COMPARISON:  04/27/2018 FINDINGS: Interval removal of the gastric tube. Interval removal of defibrillator pads. Gas within stomach, small bowel, colon. No abnormal distention. Gas extends to the rectum. No unexpected radiopaque foreign body. No unexpected soft tissue density. No unexpected calcification. No acute displaced fracture IMPRESSION: Nonobstructive bowel gas pattern. Electronically Signed   By: Gilmer Mor D.O.   On: 04/28/2018 08:43   Dg Abd Portable 1 View  Result Date: 04/27/2018 CLINICAL DATA:  NG tube placement EXAM: PORTABLE ABDOMEN - 1 VIEW COMPARISON:  04/26/2018 FINDINGS: Elevation of the left diaphragm with atelectasis  at the left base. Esophageal tube tip overlies the gastric body. Mild diffuse increased bowel gas. IMPRESSION: Esophageal tube tip overlies the gastric body Electronically Signed   By: Jasmine Pang M.D.   On: 04/27/2018 03:15   Dg Abd Portable 1 View  Result Date: 04/26/2018 CLINICAL DATA:  Abdominal pain, unresponsive EXAM: PORTABLE ABDOMEN - 1 VIEW COMPARISON:  Portable exam 2331 hours compared to CT abdomen and pelvis of 03/13/2017 FINDINGS: Gaseous distention of stomach. Large and small bowel gas pattern normal. No bowel dilatation or bowel wall thickening. Degenerative changes at lower lumbar spine. IMPRESSION: Gaseous distention of stomach. Electronically Signed   By: Ulyses Southward M.D.   On: 04/26/2018 23:44    Time Spent in minutes  30   Susa Raring M.D on 04/28/2018 at 10:32 AM  Between 7am to 7pm - Pager - [phone] ( page via [email or web address], text pages only, please mention full 10 digit call back number). After 7pm go to [email or web address] - password [email or web address]

## 2018-04-28 NOTE — Progress Notes (Signed)
Introduced myself to this patient, Russell Carter.  Talked about Advanced Directives. Patient would like to name his son, Russell Carter., to be HCPOA.  Patient states he is having lots of testing done today.  Will plan to complete paperwork with notary and witnesses sometime this afternoon.    04/28/18 0958  Clinical Encounter Type  Visited With Patient  Visit Type Initial;Spiritual support  Spiritual Encounters  Spiritual Needs Literature (Advanced Directives)

## 2018-04-29 ENCOUNTER — Encounter: Payer: Self-pay | Admitting: Internal Medicine

## 2018-04-29 ENCOUNTER — Encounter (HOSPITAL_COMMUNITY)
Admission: EM | Disposition: A | Discharge status: Home / Self Care | Payer: Self-pay | Source: Home / Self Care | Attending: Internal Medicine

## 2018-04-29 HISTORY — PX: LEFT HEART CATH AND CORONARY ANGIOGRAPHY: CATH118249

## 2018-04-29 LAB — PROTIME-INR
INR: 1.68
Prothrombin Time: 19.7 seconds — ABNORMAL HIGH (ref 11.4–15.2)

## 2018-04-29 LAB — CBC
HEMATOCRIT: 38.4 % — AB (ref 39.0–52.0)
Hemoglobin: 12.2 g/dL — ABNORMAL LOW (ref 13.0–17.0)
MCH: 29.8 pg (ref 26.0–34.0)
MCHC: 31.8 g/dL (ref 30.0–36.0)
MCV: 93.7 fL (ref 78.0–100.0)
Platelets: 175 10*3/uL (ref 150–400)
RBC: 4.1 MIL/uL — AB (ref 4.22–5.81)
RDW: 14.1 % (ref 11.5–15.5)
WBC: 4 10*3/uL (ref 4.0–10.5)

## 2018-04-29 LAB — GLUCOSE, CAPILLARY
GLUCOSE-CAPILLARY: 103 mg/dL — AB (ref 70–99)
GLUCOSE-CAPILLARY: 88 mg/dL (ref 70–99)
Glucose-Capillary: 75 mg/dL (ref 70–99)
Glucose-Capillary: 79 mg/dL (ref 70–99)
Glucose-Capillary: 81 mg/dL (ref 70–99)
Glucose-Capillary: 96 mg/dL (ref 70–99)

## 2018-04-29 LAB — COMPREHENSIVE METABOLIC PANEL
ALBUMIN: 2.9 g/dL — AB (ref 3.5–5.0)
ALT: 19 U/L (ref 0–44)
AST: 17 U/L (ref 15–41)
Alkaline Phosphatase: 38 U/L (ref 38–126)
Anion gap: 6 (ref 5–15)
BILIRUBIN TOTAL: 0.7 mg/dL (ref 0.3–1.2)
BUN: 6 mg/dL — AB (ref 8–23)
CO2: 26 mmol/L (ref 22–32)
Calcium: 8.7 mg/dL — ABNORMAL LOW (ref 8.9–10.3)
Chloride: 109 mmol/L (ref 98–111)
Creatinine, Ser: 1.13 mg/dL (ref 0.61–1.24)
GFR calc Af Amer: >60 mL/min (ref >60)
GFR calc non Af Amer: >60 mL/min (ref >60)
GLUCOSE: 93 mg/dL (ref 70–99)
POTASSIUM: 3.9 mmol/L (ref 3.5–5.1)
Sodium: 141 mmol/L (ref 135–145)
TOTAL PROTEIN: 5.5 g/dL — AB (ref 6.5–8.1)

## 2018-04-29 LAB — PROCALCITONIN: Procalcitonin: <0.1 ng/mL

## 2018-04-29 SURGERY — LEFT HEART CATH AND CORONARY ANGIOGRAPHY
Anesthesia: LOCAL

## 2018-04-29 MED ORDER — SODIUM CHLORIDE 0.9% FLUSH
3.0000 mL | Freq: Two times a day (BID) | INTRAVENOUS | Status: DC
  Administered 2018-04-29: 3 mL via INTRAVENOUS

## 2018-04-29 MED ORDER — DEXTROSE-NACL 5-0.45 % IV SOLN
INTRAVENOUS | Status: AC
  Administered 2018-04-29 (×2): via INTRAVENOUS

## 2018-04-29 MED ORDER — FENTANYL CITRATE (PF) 100 MCG/2ML IJ SOLN
INTRAMUSCULAR | Status: DC | PRN
  Administered 2018-04-29: 25 ug via INTRAVENOUS

## 2018-04-29 MED ORDER — IOHEXOL 350 MG/ML SOLN
INTRAVENOUS | Status: DC | PRN
  Administered 2018-04-29: 40 mL via INTRAVENOUS

## 2018-04-29 MED ORDER — MIDAZOLAM HCL 2 MG/2ML IJ SOLN
INTRAMUSCULAR | Status: AC
  Filled 2018-04-29: qty 2

## 2018-04-29 MED ORDER — HEPARIN (PORCINE) IN NACL 100-0.45 UNIT/ML-% IJ SOLN
1300.0000 [IU]/h | INTRAMUSCULAR | Status: DC
  Administered 2018-04-29 – 2018-04-30 (×2): 1300 [IU]/h via INTRAVENOUS
  Filled 2018-04-29 (×2): qty 250

## 2018-04-29 MED ORDER — LIDOCAINE HCL (PF) 1 % IJ SOLN
INTRAMUSCULAR | Status: DC | PRN
  Administered 2018-04-29: 2 mL

## 2018-04-29 MED ORDER — LOSARTAN POTASSIUM 25 MG PO TABS: 25.0000 mg | ORAL_TABLET | Freq: Every day | ORAL | Status: DC

## 2018-04-29 MED ORDER — HEPARIN (PORCINE) IN NACL 1000-0.9 UT/500ML-% IV SOLN
INTRAVENOUS | Status: DC | PRN
  Administered 2018-04-29: 500 mL

## 2018-04-29 MED ORDER — SODIUM CHLORIDE 0.9% FLUSH: 3.0000 mL | INTRAVENOUS | Status: DC | PRN

## 2018-04-29 MED ORDER — SODIUM CHLORIDE 0.9 % WEIGHT BASED INFUSION: 1.0000 mL/kg/h | INTRAVENOUS | Status: DC

## 2018-04-29 MED ORDER — SODIUM CHLORIDE 0.9 % WEIGHT BASED INFUSION: 1.0000 mL/kg/h | INTRAVENOUS | Status: AC

## 2018-04-29 MED ORDER — FENTANYL CITRATE (PF) 100 MCG/2ML IJ SOLN
INTRAMUSCULAR | Status: AC
  Filled 2018-04-29: qty 2

## 2018-04-29 MED ORDER — VERAPAMIL HCL 2.5 MG/ML IV SOLN
INTRAVENOUS | Status: AC
  Filled 2018-04-29: qty 2

## 2018-04-29 MED ORDER — HEPARIN SODIUM (PORCINE) 1000 UNIT/ML IJ SOLN
INTRAMUSCULAR | Status: AC
  Filled 2018-04-29: qty 1

## 2018-04-29 MED ORDER — ASPIRIN 81 MG PO CHEW: 81.0000 mg | CHEWABLE_TABLET | ORAL | Status: DC

## 2018-04-29 MED ORDER — VERAPAMIL HCL 2.5 MG/ML IV SOLN
INTRAVENOUS | Status: DC | PRN
  Administered 2018-04-29: 11:00:00 via INTRA_ARTERIAL

## 2018-04-29 MED ORDER — MIDAZOLAM HCL 2 MG/2ML IJ SOLN
INTRAMUSCULAR | Status: DC | PRN
  Administered 2018-04-29: 1 mg via INTRAVENOUS

## 2018-04-29 MED ORDER — HEPARIN (PORCINE) IN NACL 100-0.45 UNIT/ML-% IJ SOLN
1300.0000 [IU]/h | INTRAMUSCULAR | Status: DC
  Administered 2018-04-29: 1300 [IU]/h via INTRAVENOUS
  Filled 2018-04-29: qty 250

## 2018-04-29 MED ORDER — SODIUM CHLORIDE 0.9 % IV SOLN
250.0000 mL | INTRAVENOUS | Status: DC | PRN
  Administered 2018-04-29: 250 mL via INTRAVENOUS

## 2018-04-29 MED ORDER — LIDOCAINE HCL (PF) 1 % IJ SOLN
INTRAMUSCULAR | Status: AC
  Filled 2018-04-29: qty 30

## 2018-04-29 MED ORDER — HEPARIN SODIUM (PORCINE) 1000 UNIT/ML IJ SOLN
INTRAMUSCULAR | Status: DC | PRN
  Administered 2018-04-29: 5000 [IU] via INTRAVENOUS

## 2018-04-29 MED ORDER — SODIUM CHLORIDE 0.9% FLUSH
3.0000 mL | Freq: Two times a day (BID) | INTRAVENOUS | Status: DC
  Administered 2018-04-30: 3 mL via INTRAVENOUS

## 2018-04-29 MED ORDER — SODIUM CHLORIDE 0.9 % WEIGHT BASED INFUSION: 3.0000 mL/kg/h | INTRAVENOUS | Status: DC

## 2018-04-29 MED ORDER — SODIUM CHLORIDE 0.9 % IV SOLN: 250.0000 mL | INTRAVENOUS | Status: DC | PRN

## 2018-04-29 MED ORDER — PANTOPRAZOLE SODIUM 40 MG PO TBEC
40.0000 mg | DELAYED_RELEASE_TABLET | Freq: Two times a day (BID) | ORAL | Status: DC
  Administered 2018-04-29 – 2018-05-01 (×5): 40 mg via ORAL
  Filled 2018-04-29 (×5): qty 1

## 2018-04-29 MED ORDER — HEPARIN (PORCINE) IN NACL 1000-0.9 UT/500ML-% IV SOLN
INTRAVENOUS | Status: AC
  Filled 2018-04-29: qty 1000

## 2018-04-29 MED ORDER — DEXTROSE-NACL 5-0.45 % IV SOLN
INTRAVENOUS | Status: DC
  Administered 2018-04-29: 01:00:00 via INTRAVENOUS

## 2018-04-29 MED ORDER — HYDRALAZINE HCL 20 MG/ML IJ SOLN
10.0000 mg | Freq: Four times a day (QID) | INTRAMUSCULAR | Status: DC | PRN
  Administered 2018-04-30 – 2018-05-01 (×2): 10 mg via INTRAVENOUS
  Filled 2018-04-29 (×2): qty 1

## 2018-04-29 MED ORDER — HEPARIN SODIUM (PORCINE) 5000 UNIT/ML IJ SOLN: 5000.0000 [IU] | Freq: Three times a day (TID) | INTRAMUSCULAR | Status: DC

## 2018-04-29 SURGICAL SUPPLY — 10 items
CATH 5FR JL3.5 JR4 ANG PIG MP (CATHETERS) ×1 IMPLANT
DEVICE RAD COMP TR BAND LRG (VASCULAR PRODUCTS) ×1 IMPLANT
ELECT DEFIB PAD ADLT CADENCE (PAD) ×1 IMPLANT
GLIDESHEATH SLEND SS 6F .021 (SHEATH) ×1 IMPLANT
GUIDEWIRE INQWIRE 1.5J.035X260 (WIRE) IMPLANT
INQWIRE 1.5J .035X260CM (WIRE) ×2
KIT HEART LEFT (KITS) ×2 IMPLANT
PACK CARDIAC CATHETERIZATION (CUSTOM PROCEDURE TRAY) ×2 IMPLANT
TRANSDUCER W/STOPCOCK (MISCELLANEOUS) ×2 IMPLANT
TUBING CIL FLEX 10 FLL-RA (TUBING) ×2 IMPLANT

## 2018-04-29 NOTE — Progress Notes (Addendum)
@IPLOG @        PROGRESS NOTE                                                                                                                                                                                                             Patient Demographics:    Russell Carter, is a 62 y.o. male, DOB - 10-Feb-1956, YNW:[id number]  Admit date - 04/26/2018   Admitting Physician Marcelle Smiling, MD  Outpatient Primary MD for the patient is Marcine Matar, MD  LOS - 2  Chief Complaint  Patient presents with  . Altered Mental Status  . Abdominal Pain       Brief Narrative this is a 62 year old AA male with history of CAD status post DES to RCA more than 1 year ago in 2017, essential hypertension, gout, GERD, dyslipidemia, PE on anticoagulation with Coumadin who developed nonspecific right upper quadrant abdominal pain 2 days ago was brought in by wife for feeling lethargic with abdominal pain and some somnolence.  In the ER he was found to be bradycardic.  He was admitted by pulmonary critical care and seen by cardiology.  He was transferred to my care on 04/28/2018 with ongoing right upper quadrant abdominal pain.   Subjective:    Patient in bed, appears comfortable, denies any headache, no fever, no chest pain or pressure, no shortness of breath , still complaining of right upper quadrant/epigastric abdominal pain which is worse after eating meals. No focal weakness.   Assessment  & Plan :     1.  Lethargy.  Likely due to bradycardia which in turn likely was caused by vagal response to abdominal pain, currently stable, TSH stable. On 04/29/2018 some episodes of type II heart block, cardiology on board will be informed.  Continue monitoring on telemetry.  2.  Ongoing right upper quadrant abdominal pain.  Seems to be the main issue here.  CT abdomen pelvis noncontrast in the ER was unremarkable, liver enzymes unremarkable, discussed with general surgery saw the patient, we checked his  hepatobiliary system however his right upper quadrant ultrasound and HIDA scan were unremarkable as well.  Continues to have epigastric/right upper quadrant pain and tenderness which is worse after having meals, bowel ischemia remains in differential will repeat CT scan abdomen pelvis with contrast, [facility] has been consulted as well, continue n.p.o. for now. ? Soft tissue pain at Lovenox site (at home).  3. CAD status post DES to RCA in 2017 with atypical chest pain.  Continue statin, on low-dose aspirin,  Coumadin on hold and currently on heparin drip, ruled out for MI with serial negative troponin, cardiology following , EKG negative with episodes of chest pain, clinically do not think his chest pain is cardiac in origin but will defer to cardiology.  For now continue heparin drip and hold Coumadin until GI work-up is completed.  4. H/O PE.  Was on Coumadin at home currently on heparin drip.  5.  Dyslipidemia.  Continue statin.  6.  History of cocaine abuse.  Says does not do it anymore.  Tox screen negative.  Will monitor.  7. Questionable complete heart block/second-degree AV block.  Stable now.  Cardiology on board will defer management to them on telemetry, TSH stable not on any AV node blocking agents.    8. DM type II.  On sliding scale.  CBG (last 3)  Recent Labs    04/28/18 2027 04/29/18 0038 04/29/18 0416  GLUCAP 115* 75 96    Diet :  Diet Order           Diet NPO time specified  Diet effective 0500 tomorrow           Family Communication  : None present   Code Status : Full  Disposition Plan  : Stepdown  Consults  : PCCM, cardiology, general surgery  Procedures  :    CT A&P with IV Contrast - ordered  CT head.  Nonacute.    CT abdomen pelvis no contrast.  1. No evidence of bowel obstruction or inflammation. 2. Atelectasis in the lung bases. 3. Coronary artery calcifications. 4. Patchy areas of infiltration and emphysema in the subcutaneous fat of the anterior  abdominal wall, likely representing subcutaneous injection sites.  Echocardiogram.  Left ventricle: The cavity size was normal. Wall thickness was increased in a pattern of mild LVH. There was mild focal basal hypertrophy of the septum. Systolic function was normal. The estimated ejection fraction was in the range of 55% to 60%. Wall motion was normal; there were no regional wall motion   abnormalities. Features are consistent with a pseudonormal left ventricular filling pattern, with concomitant abnormal relaxation and increased filling pressure (grade 2 diastolic dysfunction).  Right upper quadrant ultrasound.  Negative   HIDA with CCK.  Patent biliary tree. Normal gallbladder response to fatty meal stimulation with a normal gallbladder ejection fraction of 46%.   DVT Prophylaxis  : Coumadin/SCDs/Heparin gtt  Lab Results  Component Value Date   PLT 175 04/29/2018   Lab Results  Component Value Date   INR 1.68 04/29/2018   INR 2.36 04/28/2018   INR 1.98 04/26/2018     Inpatient Medications  Scheduled Meds: . allopurinol  300 mg Oral Daily  . divalproex  250 mg Oral TID  . insulin aspart  0-15 Units Subcutaneous Q4H  . losartan  25 mg Oral Daily  . multivitamin with minerals  1 tablet Oral Daily  . oxyCODONE  7.5 mg Oral TID  . pantoprazole  40 mg Oral Daily  . rosuvastatin  40 mg Oral QHS   Continuous Infusions: . sodium chloride    . dextrose 5 % and 0.45% NaCl Stopped (04/29/18 0419)  . heparin 1,300 Units/hr (04/29/18 0730)   PRN Meds:.sodium chloride, diphenhydrAMINE, hydrALAZINE, morphine injection, nitroGLYCERIN  Antibiotics  :    Anti-infectives (From admission, onward)   Start     Dose/Rate Route Frequency Ordered Stop   04/27/18 0100  piperacillin-tazobactam (ZOSYN) IVPB 3.375 g  Status:  Discontinued     3.375  g 12.5 mL/hr over 240 Minutes Intravenous Every 8 hours 04/27/18 0029 04/27/18 0558   04/27/18 0100  vancomycin (VANCOCIN) IVPB 1000 mg/200 mL  premix  Status:  Discontinued     1,000 mg 200 mL/hr over 60 Minutes Intravenous Every 24 hours 04/27/18 0029 04/27/18 0558         Objective:   Vitals:   04/29/18 0600 04/29/18 0653 04/29/18 0700 04/29/18 0736  BP: (!) 178/94 (!) 157/97 (!) 153/96 (!) 153/96  Pulse: (!) 109 74 71   Resp: 12 14 11    Temp:    98.3 F (36.8 C)  TempSrc:    Oral  SpO2:   98%   Weight:      Height:        Wt Readings from Last 3 Encounters:  04/29/18 94.7 kg (208 lb 12.4 oz)  04/17/18 95.7 kg (211 lb)  03/04/18 98.7 kg (217 lb 9.6 oz)     Intake/Output Summary (Last 24 hours) at 04/29/2018 0840 Last data filed at 04/29/2018 0730 Gross per 24 hour  Intake 815.81 ml  Output 1700 ml  Net -884.19 ml     Physical Exam  Awake Alert, Oriented X 3, No new F.N deficits, Normal affect [place].AT,PERRAL Supple Neck,No JVD, No cervical lymphadenopathy appriciated.  Symmetrical Chest wall movement, Good air movement bilaterally, CTAB RRR,No Gallops, Rubs or new Murmurs, No Parasternal Heave +ve B.Sounds, Abd Soft, still has epigastric/right upper quadrant tenderness, No organomegaly appriciated, No rebound - guarding or rigidity. No Cyanosis, Clubbing or edema, No new Rash or bruise       Data Review:    CBC Recent Labs  Lab 04/26/18 2326 04/26/18 2345 04/27/18 0632 04/28/18 0224 04/29/18 0239  WBC 6.1  --  4.9 4.5 4.0  HGB 13.8 14.3 13.1 12.5* 12.2*  HCT 43.7 42.0 41.5 39.6 38.4*  PLT 197  --  178 179 175  MCV 94.4  --  93.7 93.8 93.7  MCH 29.8  --  29.6 29.6 29.8  MCHC 31.6  --  31.6 31.6 31.8  RDW 14.2  --  14.0 14.4 14.1  LYMPHSABS 2.7  --   --   --   --   MONOABS 0.8  --   --   --   --   EOSABS 0.0  --   --   --   --   BASOSABS 0.0  --   --   --   --     Chemistries  Recent Labs  Lab 04/26/18 2326 04/26/18 2345 04/27/18 0316 04/27/18 0632 04/28/18 0224 04/28/18 0832 04/29/18 0239  NA 137 139  --  142 142  --  141  K 3.8 3.8  --  4.0 4.0  --  3.9  CL 112* 110  --  113*  113*  --  109  CO2 13*  --   --  22 25  --  26  GLUCOSE 113* 112*  --  123* 122*  --  93  BUN 35* 38*  --  29* 14  --  6*  CREATININE 2.84* 2.90*  --  2.03* 1.41*  --  1.13  CALCIUM 8.0*  --   --  7.7* 8.0*  --  8.7*  MG  --   --  2.0 2.2 2.0  --   --   AST 23  --   --  22  --  22 17  ALT 24  --   --  22  --  20 19  ALKPHOS 42  --   --  42  --  40 38  BILITOT 0.5  --   --  0.5  --  0.5 0.7   ------------------------------------------------------------------------------------------------------------------ No results for input(s): CHOL, HDL, LDLCALC, TRIG, CHOLHDL, LDLDIRECT in the last 72 hours.  Lab Results  Component Value Date   HGBA1C 5.8 03/04/2018   ------------------------------------------------------------------------------------------------------------------ Recent Labs    04/27/18 1249  TSH 1.057   ------------------------------------------------------------------------------------------------------------------ No results for input(s): VITAMINB12, FOLATE, FERRITIN, TIBC, IRON, RETICCTPCT in the last 72 hours.  Coagulation profile Recent Labs  Lab 04/23/18 1037 04/25/18 0938 04/26/18 2326 04/28/18 0224 04/29/18 0239  INR 1.2* 1.4* 1.98 2.36 1.68    No results for input(s): DDIMER in the last 72 hours.  Cardiac Enzymes Recent Labs  Lab 04/28/18 0832 04/28/18 1406 04/28/18 2016  TROPONINI <0.03 <0.03 <0.03   ------------------------------------------------------------------------------------------------------------------ No results found for: BNP  Micro Results Recent Results (from the past 240 hour(s))  MRSA PCR Screening     Status: None   Collection Time: 04/27/18  6:18 AM  Result Value Ref Range Status   MRSA by PCR NEGATIVE NEGATIVE Final    Comment:        The GeneXpert MRSA Assay (FDA approved for NASAL specimens only), is one component of a comprehensive MRSA colonization surveillance program. It is not intended to diagnose  MRSA infection nor to guide or monitor treatment for MRSA infections. Performed at [facility], [address] [place], [place] [address]     Radiology Reports Ct Abdomen Pelvis Wo Contrast  Result Date: 04/27/2018 CLINICAL DATA:  Acute generalized abdominal pain. EXAM: CT ABDOMEN AND PELVIS WITHOUT CONTRAST TECHNIQUE: Multidetector CT imaging of the abdomen and pelvis was performed following the standard protocol without IV contrast. COMPARISON:  03/13/2017 FINDINGS: Lower chest: Atelectasis in the lung bases. Coronary artery calcifications. Hepatobiliary: No focal liver abnormality is seen. No gallstones, gallbladder wall thickening, or biliary dilatation. Pancreas: Unremarkable. No pancreatic ductal dilatation or surrounding inflammatory changes. Spleen: Normal in size without focal abnormality. Adrenals/Urinary Tract: Adrenal glands are unremarkable. Kidneys are normal, without renal calculi, focal lesion, or hydronephrosis. Bladder is unremarkable. Stomach/Bowel: Stomach, small bowel, and colon are not abnormally distended. No wall thickening is appreciated although under distention limits evaluation. No inflammatory infiltration. Scattered diverticula in the colon. No evidence of diverticulitis. Appendix is normal. Vascular/Lymphatic: No significant vascular findings are present. No enlarged abdominal or pelvic lymph nodes. Reproductive: Prostate gland is not enlarged. Other: No free air or free fluid in the abdomen. Abdominal wall musculature appears intact. Postoperative changes in the left inguinal region suggesting previous hernia repair. There patchy areas of infiltration and emphysema in the subcutaneous fat of the anterior abdominal wall. These likely represent subcutaneous injection sites. Surface contusion or laceration could also have this appearance in the appropriate clinical setting. Musculoskeletal: Degenerative changes in the lumbar spine. No destructive bone lesions.  IMPRESSION: 1. No evidence of bowel obstruction or inflammation. 2. Atelectasis in the lung bases. 3. Coronary artery calcifications. 4. Patchy areas of infiltration and emphysema in the subcutaneous fat of the anterior abdominal wall, likely representing subcutaneous injection sites. Electronically Signed   By: Burman Nieves M.D.   On: 04/27/2018 01:30   Ct Head Wo Contrast  Result Date: 04/27/2018 CLINICAL DATA:  Altered level of consciousness. EXAM: CT HEAD WITHOUT CONTRAST TECHNIQUE: Contiguous axial images were obtained from the base of the skull through the vertex without intravenous contrast. COMPARISON:  CT head 07/31/2017.  MRI brain  05/15/2017. FINDINGS: Brain: No evidence of acute infarction, hemorrhage, hydrocephalus, extra-axial collection or mass lesion/mass effect. Mild diffuse cerebral atrophy. Vascular: No hyperdense vessel or unexpected calcification. Skull: Normal. Negative for fracture or focal lesion. Sinuses/Orbits: No acute finding. Other: None. IMPRESSION: No acute intracranial abnormalities.  Mild cerebral atrophy. Electronically Signed   By: Burman Nieves M.D.   On: 04/27/2018 01:25   Nm Hepato W/eject Fract  Result Date: 04/28/2018 CLINICAL DATA:  RIGHT upper quadrant pain question cholecystitis EXAM: NUCLEAR MEDICINE HEPATOBILIARY IMAGING WITH GALLBLADDER EF TECHNIQUE: Sequential images of the abdomen were obtained out to 60 minutes following intravenous administration of radiopharmaceutical. After oral ingestion of Ensure, gallbladder ejection fraction was determined. At 60 min, normal ejection fraction is greater than 33%. RADIOPHARMACEUTICALS:  5.0 mCi Tc-13m  Choletec IV COMPARISON:  None FINDINGS: Normal tracer extraction from bloodstream indicating normal hepatocellular function. Normal excretion of tracer into biliary tree. Gallbladder visualized at 10 min. Small bowel visualized at 52 min. No hepatic retention of tracer. Subjectively normal emptying of tracer from  gallbladder following fatty meal stimulation. Calculated gallbladder ejection fraction is 46%, normal. Patient reported no symptoms following Ensure ingestion. Normal gallbladder ejection fraction following Ensure ingestion is greater than 33% at 1 hour. IMPRESSION: Patent biliary tree. Normal gallbladder response to fatty meal stimulation with a normal gallbladder ejection fraction of 46%. Electronically Signed   By: Ulyses Southward M.D.   On: 04/28/2018 19:10   Dg Chest Portable 1 View  Result Date: 04/26/2018 CLINICAL DATA:  Unresponsive, abdominal and leg pain EXAM: PORTABLE CHEST 1 VIEW COMPARISON:  Portable exam 2331 hours compared to 03/12/2017 FINDINGS: External pacing leads project over chest. Normal heart size, mediastinal contours, and pulmonary vascularity. Low lung volumes with bibasilar atelectasis. No definite infiltrate, pleural effusion or pneumothorax. Cervical spine fusion. IMPRESSION: Low lung volumes with bibasilar atelectasis. Electronically Signed   By: Ulyses Southward M.D.   On: 04/26/2018 23:43   Dg Abd Portable 1v  Result Date: 04/28/2018 CLINICAL DATA:  62 year old male with a history of abdominal pain EXAM: PORTABLE ABDOMEN - 1 VIEW COMPARISON:  04/27/2018 FINDINGS: Interval removal of the gastric tube. Interval removal of defibrillator pads. Gas within stomach, small bowel, colon. No abnormal distention. Gas extends to the rectum. No unexpected radiopaque foreign body. No unexpected soft tissue density. No unexpected calcification. No acute displaced fracture IMPRESSION: Nonobstructive bowel gas pattern. Electronically Signed   By: Gilmer Mor D.O.   On: 04/28/2018 08:43   Dg Abd Portable 1 View  Result Date: 04/27/2018 CLINICAL DATA:  NG tube placement EXAM: PORTABLE ABDOMEN - 1 VIEW COMPARISON:  04/26/2018 FINDINGS: Elevation of the left diaphragm with atelectasis at the left base. Esophageal tube tip overlies the gastric body. Mild diffuse increased bowel gas. IMPRESSION:  Esophageal tube tip overlies the gastric body Electronically Signed   By: Jasmine Pang M.D.   On: 04/27/2018 03:15   Dg Abd Portable 1 View  Result Date: 04/26/2018 CLINICAL DATA:  Abdominal pain, unresponsive EXAM: PORTABLE ABDOMEN - 1 VIEW COMPARISON:  Portable exam 2331 hours compared to CT abdomen and pelvis of 03/13/2017 FINDINGS: Gaseous distention of stomach. Large and small bowel gas pattern normal. No bowel dilatation or bowel wall thickening. Degenerative changes at lower lumbar spine. IMPRESSION: Gaseous distention of stomach. Electronically Signed   By: Ulyses Southward M.D.   On: 04/26/2018 23:44   US Abdomen Limited Ruq  Result Date: 04/28/2018 CLINICAL DATA:  RIGHT upper quadrant pain. EXAM: ULTRASOUND ABDOMEN LIMITED RIGHT UPPER QUADRANT  COMPARISON:  Hepatobiliary scan April 28, 2018 and CT abdomen and pelvis April 27, 2018. FINDINGS: Gallbladder: No gallstones or wall thickening visualized. No sonographic Murphy sign noted by sonographer. Common bile duct: Diameter: 3 mm. Liver: No focal lesion identified. Within normal limits in parenchymal echogenicity. Portal vein is patent on color Doppler imaging with normal direction of blood flow towards the liver. IMPRESSION: Negative RIGHT upper quadrant ultrasound. Electronically Signed   By: Awilda Metro M.D.   On: 04/28/2018 21:47    Time Spent in minutes  30   Susa Raring M.D on 04/29/2018 at 8:40 AM  Between 7am to 7pm - Pager - [phone] ( page via [email or web address], text pages only, please mention full 10 digit call back number). After 7pm go to [email or web address] - password [email or web address]

## 2018-04-29 NOTE — Progress Notes (Signed)
ANTICOAGULATION CONSULT NOTE - Follow-Up Consult  Pharmacy Consult for Warfarin>>>Heparin  Indication: pulmonary embolus/CVA/CP  Allergies  Allergen Reactions  . Shellfish Allergy Anaphylaxis   Patient Measurements: Height: 5\' 11"  (180.3 cm) Weight: 215 lb 9.8 oz (97.8 kg) IBW/kg (Calculated) : 75.3  Vital Signs: Temp: 98.9 F (37.2 C) (07/09 0046) Temp Source: Oral (07/09 0046) BP: 144/87 (07/09 0300) Pulse Rate: 81 (07/08 2300)  Labs: Recent Labs    04/26/18 2326  04/27/18 2426  04/28/18 0224 04/28/18 0832 04/28/18 1406 04/28/18 2016 04/29/18 0239  HGB 13.8   < > 13.1  --  12.5*  --   --   --  12.2*  HCT 43.7   < > 41.5  --  39.6  --   --   --  38.4*  PLT 197  --  178  --  179  --   --   --  175  LABPROT 22.3*  --   --   --  25.6*  --   --   --  19.7*  INR 1.98  --   --   --  2.36  --   --   --  1.68  CREATININE 2.84*   < > 2.03*  --  1.41*  --   --   --  1.13  CKTOTAL 544*  --   --   --   --   --   --   --   --   TROPONINI <0.03  --  <0.03   < > <0.03 <0.03 <0.03 <0.03  --    < > = values in this interval not displayed.    Estimated Creatinine Clearance: 79.8 mL/min (by C-G formula based on SCr of 1.13 mg/dL).   Medical History: Past Medical History:  Diagnosis Date  . Anginal pain (HCC)   . Anxiety   . Arthritis   . Bipolar disorder (HCC)   . Blood in stool   . CAD (coronary artery disease)    a. 2011: cath showing mod non-obstructive disease b. 02/2016: cath with 95% stenosis in the Mid RCA. DES placed.  . Cervical stenosis of spine   . Cocaine abuse (HCC)    history  . Depression   . Diabetes mellitus without complication (HCC)   . Gout, unspecified   . Headache   . HTN (hypertension)   . Hypercholesterolemia   . Hyperglycemia   . Migraines   . Myocardial infarction [facility]) 2008   drug cocaine and marijuana at time of mi none  since    . Pneumonia   . Rectal bleeding   . Renal insufficiency   . Seizures (HCC)   . Stroke [facility])     Assessment: 20 yoM admitted with bradycardia. Pt on warfarin PTA for hx PE and CVA, INR subtherapeutic on admit now 2.3 s/p resuming 7/7. Per cards, likely no indication for PPM but likely to need stress test vs cath given ongoing CP. Will hold warfarin and initiate IV heparin once INR < 2.  7/9 AM update: INR is now <2 this AM, starting heparin   Goal of Therapy:  Heparin level 0.3-0.7 units/mL Monitor platelets by anticoagulation protocol: Yes   Plan:  INR is now <2 Start heparin drip at 1300 units/hr 1230 HL  Abran Duke, PharmD, BCPS Clinical Pharmacist Phone: [phone]

## 2018-04-29 NOTE — Progress Notes (Signed)
Received from Cath lab,patient alert and oriented. Right radial site TR band with 13cc air per report, level 0. Will monitor accordingly.

## 2018-04-29 NOTE — Progress Notes (Signed)
Progress Note  Patient Name: Russell Carter Date of Encounter: 04/29/2018  Primary Cardiologist: Charlton Haws, MD   Subjective   62 yo male, admitted with abdominal pain and bradycardia .  Also had a metabolic acidosis and worsening CKD.  Renal function and bradycardia have improved with time and IV hydration.  He complains of some chest pain this am  Troponin levels have been negative so far.  Coumadin has been held.  His INR is in acceptable range to have a cath.  He continues to have abdominal pain.  Surgery has evaluated his abdominal pain and has determined that there is not a surgical issue with his belly. HIDA scan was negative. We are unable to do a nuclear stress test because he had a HIDA scan yesterday.   Inpatient Medications    Scheduled Meds: . allopurinol  300 mg Oral Daily  . divalproex  250 mg Oral TID  . insulin aspart  0-15 Units Subcutaneous Q4H  . multivitamin with minerals  1 tablet Oral Daily  . oxyCODONE  7.5 mg Oral TID  . pantoprazole  40 mg Oral BID  . rosuvastatin  40 mg Oral QHS   Continuous Infusions: . sodium chloride    . dextrose 5 % and 0.45% NaCl 75 mL/hr at 04/29/18 0901  . heparin 1,300 Units/hr (04/29/18 0730)   PRN Meds: sodium chloride, diphenhydrAMINE, hydrALAZINE, morphine injection, nitroGLYCERIN   Vital Signs    Vitals:   04/29/18 0653 04/29/18 0700 04/29/18 0736 04/29/18 0800  BP: (!) 157/97 (!) 153/96 (!) 153/96 (!) 154/83  Pulse: 74 71  65  Resp: 14 11  12   Temp:   98.3 F (36.8 C)   TempSrc:   Oral   SpO2:  98%  100%  Weight:      Height:        Intake/Output Summary (Last 24 hours) at 04/29/2018 0906 Last data filed at 04/29/2018 0730 Gross per 24 hour  Intake 815.81 ml  Output 1700 ml  Net -884.19 ml   Filed Weights   04/27/18 0600 04/28/18 0600 04/29/18 0500  Weight: 208 lb 12.4 oz (94.7 kg) 215 lb 9.8 oz (97.8 kg) 208 lb 12.4 oz (94.7 kg)    Telemetry    Normal sinus rhythm.  He has occasional  episodes of Wenke Bach heart block.- Personally Reviewed  ECG     NSR at 86.   NS ST abn in the lateral leads.  - Personally Reviewed  Physical Exam   Physical Exam: Blood pressure (!) 154/83, pulse 65, temperature 98.3 F (36.8 C), temperature source Oral, resp. rate 12, height 5\' 11"  (1.803 m), weight 208 lb 12.4 oz (94.7 kg), SpO2 100 %.  GEN:  Middle aged , black gentleman , in mild distress, uncomfortable   HEENT: Normal NECK: No JVD; No carotid bruits LYMPHATICS: No lymphadenopathy CARDIAC: RRR   RESPIRATORY:  Clear to auscultation without rales, wheezing or rhonchi  ABDOMEN:  Mildly distended,   + BS,  ? Rebound  MUSCULOSKELETAL:  No edema; No deformity  SKIN: Warm and dry NEUROLOGIC:  Alert and oriented x 3   Labs    Chemistry Recent Labs  Lab 04/27/18 9147 04/28/18 0224 04/28/18 0832 04/29/18 0239  NA 142 142  --  141  K 4.0 4.0  --  3.9  CL 113* 113*  --  109  CO2 22 25  --  26  GLUCOSE 123* 122*  --  93  BUN 29* 14  --  6*  CREATININE 2.03* 1.41*  --  1.13  CALCIUM 7.7* 8.0*  --  8.7*  PROT 5.8*  --  5.5* 5.5*  ALBUMIN 3.2*  --  3.0* 2.9*  AST 22  --  22 17  ALT 22  --  20 19  ALKPHOS 42  --  40 38  BILITOT 0.5  --  0.5 0.7  GFRNONAA 33* 52*  --  >60  GFRAA 38* 60*  --  >60  ANIONGAP 7 4*  --  6     Hematology Recent Labs  Lab 04/27/18 0632 04/28/18 0224 04/29/18 0239  WBC 4.9 4.5 4.0  RBC 4.43 4.22 4.10*  HGB 13.1 12.5* 12.2*  HCT 41.5 39.6 38.4*  MCV 93.7 93.8 93.7  MCH 29.6 29.6 29.8  MCHC 31.6 31.6 31.8  RDW 14.0 14.4 14.1  PLT 178 179 175    Cardiac Enzymes Recent Labs  Lab 04/28/18 0224 04/28/18 0832 04/28/18 1406 04/28/18 2016  TROPONINI <0.03 <0.03 <0.03 <0.03    Recent Labs  Lab 04/26/18 2343  TROPIPOC 0.01     BNPNo results for input(s): BNP, PROBNP in the last 168 hours.   DDimer No results for input(s): DDIMER in the last 168 hours.   Radiology    Nm Hepato W/eject Fract  Result Date: 04/28/2018 CLINICAL  DATA:  RIGHT upper quadrant pain question cholecystitis EXAM: NUCLEAR MEDICINE HEPATOBILIARY IMAGING WITH GALLBLADDER EF TECHNIQUE: Sequential images of the abdomen were obtained out to 60 minutes following intravenous administration of radiopharmaceutical. After oral ingestion of Ensure, gallbladder ejection fraction was determined. At 60 min, normal ejection fraction is greater than 33%. RADIOPHARMACEUTICALS:  5.0 mCi Tc-73m  Choletec IV COMPARISON:  None FINDINGS: Normal tracer extraction from bloodstream indicating normal hepatocellular function. Normal excretion of tracer into biliary tree. Gallbladder visualized at 10 min. Small bowel visualized at 52 min. No hepatic retention of tracer. Subjectively normal emptying of tracer from gallbladder following fatty meal stimulation. Calculated gallbladder ejection fraction is 46%, normal. Patient reported no symptoms following Ensure ingestion. Normal gallbladder ejection fraction following Ensure ingestion is greater than 33% at 1 hour. IMPRESSION: Patent biliary tree. Normal gallbladder response to fatty meal stimulation with a normal gallbladder ejection fraction of 46%. Electronically Signed   By: Ulyses Southward M.D.   On: 04/28/2018 19:10   Dg Abd Portable 1v  Result Date: 04/28/2018 CLINICAL DATA:  62 year old male with a history of abdominal pain EXAM: PORTABLE ABDOMEN - 1 VIEW COMPARISON:  04/27/2018 FINDINGS: Interval removal of the gastric tube. Interval removal of defibrillator pads. Gas within stomach, small bowel, colon. No abnormal distention. Gas extends to the rectum. No unexpected radiopaque foreign body. No unexpected soft tissue density. No unexpected calcification. No acute displaced fracture IMPRESSION: Nonobstructive bowel gas pattern. Electronically Signed   By: Gilmer Mor D.O.   On: 04/28/2018 08:43   US Abdomen Limited Ruq  Result Date: 04/28/2018 CLINICAL DATA:  RIGHT upper quadrant pain. EXAM: ULTRASOUND ABDOMEN LIMITED RIGHT UPPER  QUADRANT COMPARISON:  Hepatobiliary scan April 28, 2018 and CT abdomen and pelvis April 27, 2018. FINDINGS: Gallbladder: No gallstones or wall thickening visualized. No sonographic Murphy sign noted by sonographer. Common bile duct: Diameter: 3 mm. Liver: No focal lesion identified. Within normal limits in parenchymal echogenicity. Portal vein is patent on color Doppler imaging with normal direction of blood flow towards the liver. IMPRESSION: Negative RIGHT upper quadrant ultrasound. Electronically Signed   By: Awilda Metro M.D.   On: 04/28/2018 21:47  Cardiac Studies     Patient Profile     62 y.o. male with history of coronary artery disease.  He was admitted with abdominal pain, bradycardia and worsening renal function. Now describes some chest pain this morning that was relieved with 3 sublingual nitroglycerin.  Assessment & Plan    1.  Chest discomfort: Patient had episodes of chest discomfort this morning.  He states that these were very similar to his previous episodes of chest pain.  They were relieved eventually with 3 sublingual nitroglycerin.  His he also has had some abdominal pain.  These pains are somewhat atypical.  I have reviewed his hospitalization from 2017 and he had very atypical chest pain at that time prior to his RCA stent.  Surgery as evaluated him and there is no evidence of an acute abdomen. Her best option going forward is to proceed with heart catheterization to rule out coronary artery disease. His INR is 1.68 which should be fine for heart catheterization. His creatinine has improved and is now 1.13.  We have discussed the risks, benefits, options of heart catheterization.  He understands and agrees to proceed.  2.  Intermittent Mobitz type I heart block: The patient has intermittent episodes of week about heart block.  It should be noted that he had some evidence of heart block during his 2017 admission when he received the RCA stent. We will continue to  follow.  For questions or updates, please contact [facility] Please consult [email or web address] for contact info under Cardiology/STEMI.      Signed, Kristeen Miss, MD  04/29/2018, 9:06 AM

## 2018-04-29 NOTE — Progress Notes (Signed)
Pt is being briefly admitted to the holding area.  Report is being called to the floor by cath lab personnel.  Pt is being monitored on a transport monitor.  Current vitals are Afib at a rate of 61bpm, 98% sp02, 168/99 BP.  Pt is alert and oriented.  No intervention was performed.  TR band is in place.  Vascular site is a level 0.  Pt will be transported to his room on 3E shortly.  Will continue to monitor via transport monitor.

## 2018-04-29 NOTE — H&P (View-Only) (Signed)
 Progress Note  Patient Name: Russell Carter Date of Encounter: 04/29/2018  Primary Cardiologist: Peter Nishan, MD   Subjective   62 yo male, admitted with abdominal pain and bradycardia .  Also had a metabolic acidosis and worsening CKD.  Renal function and bradycardia have improved with time and IV hydration.  He complains of some chest pain this am  Troponin levels have been negative so far.  Coumadin has been held.  His INR is in acceptable range to have a cath.  He continues to have abdominal pain.  Surgery has evaluated his abdominal pain and has determined that there is not a surgical issue with his belly. HIDA scan was negative. We are unable to do a nuclear stress test because he had a HIDA scan yesterday.   Inpatient Medications    Scheduled Meds: . allopurinol  300 mg Oral Daily  . divalproex  250 mg Oral TID  . insulin aspart  0-15 Units Subcutaneous Q4H  . multivitamin with minerals  1 tablet Oral Daily  . oxyCODONE  7.5 mg Oral TID  . pantoprazole  40 mg Oral BID  . rosuvastatin  40 mg Oral QHS   Continuous Infusions: . sodium chloride    . dextrose 5 % and 0.45% NaCl 75 mL/hr at 04/29/18 0901  . heparin 1,300 Units/hr (04/29/18 0730)   PRN Meds: sodium chloride, diphenhydrAMINE, hydrALAZINE, morphine injection, nitroGLYCERIN   Vital Signs    Vitals:   04/29/18 0653 04/29/18 0700 04/29/18 0736 04/29/18 0800  BP: (!) 157/97 (!) 153/96 (!) 153/96 (!) 154/83  Pulse: 74 71  65  Resp: 14 11  12  Temp:   98.3 F (36.8 C)   TempSrc:   Oral   SpO2:  98%  100%  Weight:      Height:        Intake/Output Summary (Last 24 hours) at 04/29/2018 0906 Last data filed at 04/29/2018 0730 Gross per 24 hour  Intake 815.81 ml  Output 1700 ml  Net -884.19 ml   Filed Weights   04/27/18 0600 04/28/18 0600 04/29/18 0500  Weight: 208 lb 12.4 oz (94.7 kg) 215 lb 9.8 oz (97.8 kg) 208 lb 12.4 oz (94.7 kg)    Telemetry    Normal sinus rhythm.  He has occasional  episodes of Wenke Bach heart block.- Personally Reviewed  ECG     NSR at 86.   NS ST abn in the lateral leads.  - Personally Reviewed  Physical Exam   Physical Exam: Blood pressure (!) 154/83, pulse 65, temperature 98.3 F (36.8 C), temperature source Oral, resp. rate 12, height 5' 11" (1.803 m), weight 208 lb 12.4 oz (94.7 kg), SpO2 100 %.  GEN:  Middle aged , black gentleman , in mild distress, uncomfortable   HEENT: Normal NECK: No JVD; No carotid bruits LYMPHATICS: No lymphadenopathy CARDIAC: RRR   RESPIRATORY:  Clear to auscultation without rales, wheezing or rhonchi  ABDOMEN:  Mildly distended,   + BS,  ? Rebound  MUSCULOSKELETAL:  No edema; No deformity  SKIN: Warm and dry NEUROLOGIC:  Alert and oriented x 3   Labs    Chemistry Recent Labs  Lab 04/27/18 0632 04/28/18 0224 04/28/18 0832 04/29/18 0239  NA 142 142  --  141  K 4.0 4.0  --  3.9  CL 113* 113*  --  109  CO2 22 25  --  26  GLUCOSE 123* 122*  --  93  BUN 29* 14  --    6*  CREATININE 2.03* 1.41*  --  1.13  CALCIUM 7.7* 8.0*  --  8.7*  PROT 5.8*  --  5.5* 5.5*  ALBUMIN 3.2*  --  3.0* 2.9*  AST 22  --  22 17  ALT 22  --  20 19  ALKPHOS 42  --  40 38  BILITOT 0.5  --  0.5 0.7  GFRNONAA 33* 52*  --  >60  GFRAA 38* 60*  --  >60  ANIONGAP 7 4*  --  6     Hematology Recent Labs  Lab 04/27/18 0632 04/28/18 0224 04/29/18 0239  WBC 4.9 4.5 4.0  RBC 4.43 4.22 4.10*  HGB 13.1 12.5* 12.2*  HCT 41.5 39.6 38.4*  MCV 93.7 93.8 93.7  MCH 29.6 29.6 29.8  MCHC 31.6 31.6 31.8  RDW 14.0 14.4 14.1  PLT 178 179 175    Cardiac Enzymes Recent Labs  Lab 04/28/18 0224 04/28/18 0832 04/28/18 1406 04/28/18 2016  TROPONINI <0.03 <0.03 <0.03 <0.03    Recent Labs  Lab 04/26/18 2343  TROPIPOC 0.01     BNPNo results for input(s): BNP, PROBNP in the last 168 hours.   DDimer No results for input(s): DDIMER in the last 168 hours.   Radiology    Nm Hepato W/eject Fract  Result Date: 04/28/2018 CLINICAL  DATA:  RIGHT upper quadrant pain question cholecystitis EXAM: NUCLEAR MEDICINE HEPATOBILIARY IMAGING WITH GALLBLADDER EF TECHNIQUE: Sequential images of the abdomen were obtained out to 60 minutes following intravenous administration of radiopharmaceutical. After oral ingestion of Ensure, gallbladder ejection fraction was determined. At 60 min, normal ejection fraction is greater than 33%. RADIOPHARMACEUTICALS:  5.0 mCi Tc-99m  Choletec IV COMPARISON:  None FINDINGS: Normal tracer extraction from bloodstream indicating normal hepatocellular function. Normal excretion of tracer into biliary tree. Gallbladder visualized at 10 min. Small bowel visualized at 52 min. No hepatic retention of tracer. Subjectively normal emptying of tracer from gallbladder following fatty meal stimulation. Calculated gallbladder ejection fraction is 46%, normal. Patient reported no symptoms following Ensure ingestion. Normal gallbladder ejection fraction following Ensure ingestion is greater than 33% at 1 hour. IMPRESSION: Patent biliary tree. Normal gallbladder response to fatty meal stimulation with a normal gallbladder ejection fraction of 46%. Electronically Signed   By: Mark  Boles M.D.   On: 04/28/2018 19:10   Dg Abd Portable 1v  Result Date: 04/28/2018 CLINICAL DATA:  62-year-old male with a history of abdominal pain EXAM: PORTABLE ABDOMEN - 1 VIEW COMPARISON:  04/27/2018 FINDINGS: Interval removal of the gastric tube. Interval removal of defibrillator pads. Gas within stomach, small bowel, colon. No abnormal distention. Gas extends to the rectum. No unexpected radiopaque foreign body. No unexpected soft tissue density. No unexpected calcification. No acute displaced fracture IMPRESSION: Nonobstructive bowel gas pattern. Electronically Signed   By: Jaime  Wagner D.O.   On: 04/28/2018 08:43   Us Abdomen Limited Ruq  Result Date: 04/28/2018 CLINICAL DATA:  RIGHT upper quadrant pain. EXAM: ULTRASOUND ABDOMEN LIMITED RIGHT UPPER  QUADRANT COMPARISON:  Hepatobiliary scan April 28, 2018 and CT abdomen and pelvis April 27, 2018. FINDINGS: Gallbladder: No gallstones or wall thickening visualized. No sonographic Murphy sign noted by sonographer. Common bile duct: Diameter: 3 mm. Liver: No focal lesion identified. Within normal limits in parenchymal echogenicity. Portal vein is patent on color Doppler imaging with normal direction of blood flow towards the liver. IMPRESSION: Negative RIGHT upper quadrant ultrasound. Electronically Signed   By: Courtnay  Bloomer M.D.   On: 04/28/2018 21:47      Cardiac Studies     Patient Profile     62 y.o. male with history of coronary artery disease.  He was admitted with abdominal pain, bradycardia and worsening renal function. Now describes some chest pain this morning that was relieved with 3 sublingual nitroglycerin.  Assessment & Plan    1.  Chest discomfort: Patient had episodes of chest discomfort this morning.  He states that these were very similar to his previous episodes of chest pain.  They were relieved eventually with 3 sublingual nitroglycerin.  His he also has had some abdominal pain.  These pains are somewhat atypical.  I have reviewed his hospitalization from 2017 and he had very atypical chest pain at that time prior to his RCA stent.  Surgery as evaluated him and there is no evidence of an acute abdomen. Her best option going forward is to proceed with heart catheterization to rule out coronary artery disease. His INR is 1.68 which should be fine for heart catheterization. His creatinine has improved and is now 1.13.  We have discussed the risks, benefits, options of heart catheterization.  He understands and agrees to proceed.  2.  Intermittent Mobitz type I heart block: The patient has intermittent episodes of week about heart block.  It should be noted that he had some evidence of heart block during his 2017 admission when he received the RCA stent. We will continue to  follow.  For questions or updates, please contact [facility] Please consult [email or web address] for contact info under Cardiology/STEMI.      Signed, Kinzley Savell, MD  04/29/2018, 9:06 AM    

## 2018-04-29 NOTE — Progress Notes (Addendum)
[facility] Progress Note     Subjective: CC-  Sitting up in chair. States that he is tired. Pain unchanged, no better and no worse than yesterday. Pain is mostly RUQ.  Objective: Vital signs in last 24 hours: Temp:  [98 F (36.7 C)-98.9 F (37.2 C)] 98.3 F (36.8 C) (07/09 0736) Pulse Rate:  [70-109] 71 (07/09 0700) Resp:  [11-22] 11 (07/09 0700) BP: (110-178)/(71-107) 153/96 (07/09 0736) SpO2:  [94 %-100 %] 98 % (07/09 0700) Weight:  [208 lb 12.4 oz (94.7 kg)] 208 lb 12.4 oz (94.7 kg) (07/09 0500) Last BM Date: 04/26/18  Intake/Output from previous day: 07/08 0701 - 07/09 0700 In: 858 [I.V.:858] Out: 1850 [Urine:1850] Intake/Output this shift: Total I/O In: 7.8 [I.V.:7.8] Out: -   PE: Gen:  Alert, NAD HEENT: EOM's intact, pupils equal and round Card:  RRR Pulm:  CTAB, no W/R/R, effort normal Abd: soft, mild distension, +BS, no masses, hernias, or organomegaly. TTP RUQ, epigastric region, and RLQ with voluntary guarding and no rebound tenderness Psych: A&Ox3  Skin: no rashes noted, warm and dry  Lab Results:  Recent Labs    04/28/18 0224 04/29/18 0239  WBC 4.5 4.0  HGB 12.5* 12.2*  HCT 39.6 38.4*  PLT 179 175   BMET Recent Labs    04/28/18 0224 04/29/18 0239  NA 142 141  K 4.0 3.9  CL 113* 109  CO2 25 26  GLUCOSE 122* 93  BUN 14 6*  CREATININE 1.41* 1.13  CALCIUM 8.0* 8.7*   PT/INR Recent Labs    04/28/18 0224 04/29/18 0239  LABPROT 25.6* 19.7*  INR 2.36 1.68   CMP     Component Value Date/Time   NA 141 04/29/2018 0239   NA 147 (H) 03/04/2018 1441   K 3.9 04/29/2018 0239   CL 109 04/29/2018 0239   CO2 26 04/29/2018 0239   GLUCOSE 93 04/29/2018 0239   BUN 6 (L) 04/29/2018 0239   BUN 11 03/04/2018 1441   CREATININE 1.13 04/29/2018 0239   CALCIUM 8.7 (L) 04/29/2018 0239   PROT 5.5 (L) 04/29/2018 0239   PROT 6.3 03/04/2018 1441   ALBUMIN 2.9 (L) 04/29/2018 0239   ALBUMIN 3.9 03/04/2018 1441   AST 17 04/29/2018 0239   ALT 19 04/29/2018 0239   ALKPHOS 38 04/29/2018 0239   BILITOT 0.7 04/29/2018 0239   BILITOT 0.3 03/04/2018 1441   GFRNONAA >60 04/29/2018 0239   GFRAA >60 04/29/2018 0239   Lipase     Component Value Date/Time   LIPASE 26 04/28/2018 0832       Studies/Results: Nm Hepato W/eject Fract  Result Date: 04/28/2018 CLINICAL DATA:  RIGHT upper quadrant pain question cholecystitis EXAM: NUCLEAR MEDICINE HEPATOBILIARY IMAGING WITH GALLBLADDER EF TECHNIQUE: Sequential images of the abdomen were obtained out to 60 minutes following intravenous administration of radiopharmaceutical. After oral ingestion of Ensure, gallbladder ejection fraction was determined. At 60 min, normal ejection fraction is greater than 33%. RADIOPHARMACEUTICALS:  5.0 mCi Tc-75m  Choletec IV COMPARISON:  None FINDINGS: Normal tracer extraction from bloodstream indicating normal hepatocellular function. Normal excretion of tracer into biliary tree. Gallbladder visualized at 10 min. Small bowel visualized at 52 min. No hepatic retention of tracer. Subjectively normal emptying of tracer from gallbladder following fatty meal stimulation. Calculated gallbladder ejection fraction is 46%, normal. Patient reported no symptoms following Ensure ingestion. Normal gallbladder ejection fraction following Ensure ingestion is greater than 33% at 1 hour. IMPRESSION: Patent biliary tree. Normal gallbladder response to fatty meal stimulation  with a normal gallbladder ejection fraction of 46%. Electronically Signed   By: Ulyses Southward M.D.   On: 04/28/2018 19:10   Dg Abd Portable 1v  Result Date: 04/28/2018 CLINICAL DATA:  62 year old male with a history of abdominal pain EXAM: PORTABLE ABDOMEN - 1 VIEW COMPARISON:  04/27/2018 FINDINGS: Interval removal of the gastric tube. Interval removal of defibrillator pads. Gas within stomach, small bowel, colon. No abnormal distention. Gas extends to the rectum. No unexpected radiopaque foreign body. No unexpected  soft tissue density. No unexpected calcification. No acute displaced fracture IMPRESSION: Nonobstructive bowel gas pattern. Electronically Signed   By: Gilmer Mor D.O.   On: 04/28/2018 08:43   US Abdomen Limited Ruq  Result Date: 04/28/2018 CLINICAL DATA:  RIGHT upper quadrant pain. EXAM: ULTRASOUND ABDOMEN LIMITED RIGHT UPPER QUADRANT COMPARISON:  Hepatobiliary scan April 28, 2018 and CT abdomen and pelvis April 27, 2018. FINDINGS: Gallbladder: No gallstones or wall thickening visualized. No sonographic Murphy sign noted by sonographer. Common bile duct: Diameter: 3 mm. Liver: No focal lesion identified. Within normal limits in parenchymal echogenicity. Portal vein is patent on color Doppler imaging with normal direction of blood flow towards the liver. IMPRESSION: Negative RIGHT upper quadrant ultrasound. Electronically Signed   By: Awilda Metro M.D.   On: 04/28/2018 21:47    Anti-infectives: Anti-infectives (From admission, onward)   Start     Dose/Rate Route Frequency Ordered Stop   04/27/18 0100  piperacillin-tazobactam (ZOSYN) IVPB 3.375 g  Status:  Discontinued     3.375 g 12.5 mL/hr over 240 Minutes Intravenous Every 8 hours 04/27/18 0029 04/27/18 0558   04/27/18 0100  vancomycin (VANCOCIN) IVPB 1000 mg/200 mL premix  Status:  Discontinued     1,000 mg 200 mL/hr over 60 Minutes Intravenous Every 24 hours 04/27/18 0029 04/27/18 0558       Assessment/Plan CAD s/p DES to RCA 02/2016 HTN Gout HLD GERD PE on coumadin - INR 1.68 today H/o multiple TIAs  Chest pain - cardiology following, EKG and troponins negative for acute changes. Considering cath if persistent CP  RUQ/epigastric abdominal pain  - Abdominal u/s and HIDA scan negative. No clear role for surgery. General surgery will sign off, call with concerns.   LOS: 2 days    Franne Forts , [facility] Surgery 04/29/2018, 8:20 AM Pager: [phone] Consults: [phone]  Agree with above. No clear  cause of abdominal pain.  Could consider upper endo to evaluate stomach and duodenum.  Ovidio Kin, MD, [facility] Surgery Pager: [phone] Office phone:  [phone]

## 2018-04-29 NOTE — Progress Notes (Signed)
ANTICOAGULATION CONSULT NOTE - Follow-Up Consult  Pharmacy Consult for Warfarin>>>Heparin  Indication: pulmonary embolus/CVA/CP  Allergies  Allergen Reactions  . Shellfish Allergy Anaphylaxis   Patient Measurements: Height: 5\' 11"  (180.3 cm) Weight: 208 lb 12.4 oz (94.7 kg) IBW/kg (Calculated) : 75.3  Vital Signs: Temp: 98.7 F (37.1 C) (07/09 1224) Temp Source: Oral (07/09 1224) BP: 175/98 (07/09 1402) Pulse Rate: 61 (07/09 1402)  Labs: Recent Labs    04/26/18 2326  04/27/18 9528  04/28/18 0224 04/28/18 0832 04/28/18 1406 04/28/18 2016 04/29/18 0239  HGB 13.8   < > 13.1  --  12.5*  --   --   --  12.2*  HCT 43.7   < > 41.5  --  39.6  --   --   --  38.4*  PLT 197  --  178  --  179  --   --   --  175  LABPROT 22.3*  --   --   --  25.6*  --   --   --  19.7*  INR 1.98  --   --   --  2.36  --   --   --  1.68  CREATININE 2.84*   < > 2.03*  --  1.41*  --   --   --  1.13  CKTOTAL 544*  --   --   --   --   --   --   --   --   TROPONINI <0.03  --  <0.03   < > <0.03 <0.03 <0.03 <0.03  --    < > = values in this interval not displayed.    Estimated Creatinine Clearance: 78.6 mL/min (by C-G formula based on SCr of 1.13 mg/dL).   Medical History: Past Medical History:  Diagnosis Date  . Anginal pain (HCC)   . Anxiety   . Arthritis   . Bipolar disorder (HCC)   . Blood in stool   . CAD (coronary artery disease)    a. 2011: cath showing mod non-obstructive disease b. 02/2016: cath with 95% stenosis in the Mid RCA. DES placed.  . Cervical stenosis of spine   . Cocaine abuse (HCC)    history  . Depression   . Diabetes mellitus without complication (HCC)   . Gout, unspecified   . Headache   . HTN (hypertension)   . Hypercholesterolemia   . Hyperglycemia   . Migraines   . Myocardial infarction [facility]) 2008   drug cocaine and marijuana at time of mi none  since    . Pneumonia   . Rectal bleeding   . Renal insufficiency   . Seizures (HCC)   . Stroke [facility])     Assessment: 66 yoM admitted with bradycardia. Pt on warfarin PTA for hx PE and CVA. Warfarin resumed 7/7, then held for procedure. INR now 1.68. Per cards, likely no indication for PPM but now s/p cath 7/9. Pharmacy consulted to dose heparin 8 hours post-sheath removal for hx PE. CBC stable. No bleed documented. Sheath removed at 1146.  Goal of Therapy:  Heparin level 0.3-0.7 units/mL Monitor platelets by anticoagulation protocol: Yes   Plan:  Resume heparin drip at previous rate 1300 units/hr at 1945 (8hrs post-sheath removal) 6h heparin level Monitor daily heparin level and CBC, s/sx bleeding F/u resume warfarin as appropriate when all procedures complete  Babs Bertin, PharmD, BCPS Clinical Pharmacist Clinical phone [phone] Please check AMION for all [facility] Pharm[facility] contact numbers 04/29/2018 2:18 PM

## 2018-04-29 NOTE — Interval H&P Note (Signed)
History and Physical Interval Note:  04/29/2018 11:11 AM  Russell Carter  has presented today for surgery, with the diagnosis of unusual pain  The various methods of treatment have been discussed with the patient and family. After consideration of risks, benefits and other options for treatment, the patient has consented to  Procedure(s): LEFT HEART CATH AND CORONARY ANGIOGRAPHY (N/A) as a surgical intervention .  The patient's history has been reviewed, patient examined, no change in status, stable for surgery.  I have reviewed the patient's chart and labs.  Questions were answered to the patient's satisfaction.   Cath Lab Visit (complete for each Cath Lab visit)  Clinical Evaluation Leading to the Procedure:   ACS: Yes.    Non-ACS:    Anginal Classification: CCS III  Anti-ischemic medical therapy: No Therapy  Non-Invasive Test Results: No non-invasive testing performed  Prior CABG: No previous CABG        [name] [facility] 04/29/2018 11:12 AM

## 2018-04-29 NOTE — Consult Note (Addendum)
Referring Provider: Dr. Thedore Mins Primary Care Physician:  Marcine Matar, MD Primary Gastroenterologist:  Dr. Evette Cristal   Reason for Consultation:  Abdominal pain   HPI: Russell Carter is a 62 y.o. male with past medical history of coronary artery disease status post PCI, history of seizure disorder, history of bipolar disorder,history of cocaine abuse, history of pulmonary embolism Coumadin currently on hold admitted to the hospital with right upper quadrant pain. Workup negative so far. GI is consulted for further evaluation.  patient seen and examined at bedside. He underwent colonoscopy on 04/17/2018 by Dr. Evette Cristal for evaluation of rectal bleeding.colonoscopy showed diverticulosis and hemorrhoids. His Coumadin was on hold. He was placed on Lovenox bridge while he was awaiting his tooth extraction. He was injecting Lovenox over the right upper quadrant for last several days. 3 days ago he started having right upper quadrant pain around injection site. Denied any associated nausea or vomiting. Denies radiation of pain. He denies any black stool or bright red blood per rectum but at this time. Pain worse with a meal but  constantly present even when he is not eating.   Colonoscopy 04/17/2018 by Dr. Evette Cristal for evaluation of rectal bleeding showed diverticulosis at ascending, descending and sigmoid colon as well as hemorrhoids. Past Medical History:  Diagnosis Date  . Anginal pain (HCC)   . Anxiety   . Arthritis   . Bipolar disorder (HCC)   . Blood in stool   . CAD (coronary artery disease)    a. 2011: cath showing mod non-obstructive disease b. 02/2016: cath with 95% stenosis in the Mid RCA. DES placed.  . Cervical stenosis of spine   . Cocaine abuse (HCC)    history  . Depression   . Diabetes mellitus without complication (HCC)   . Gout, unspecified   . Headache   . HTN (hypertension)   . Hypercholesterolemia   . Hyperglycemia   . Migraines   . Myocardial infarction [facility]) 2008   drug  cocaine and marijuana at time of mi none  since    . Pneumonia   . Rectal bleeding   . Renal insufficiency   . Seizures (HCC)   . Stroke [facility])     Past Surgical History:  Procedure Laterality Date  . ANTERIOR CERVICAL DECOMP/DISCECTOMY FUSION N/A 01/24/2017   Procedure: Cervical Four-Five,Cervical Five Six,Cervical Six-Seven Anterior cervical discectomy with fusion and plate with Cervical Three-Thoracic- One Laminectomy/Foraminotomy with Cervical Three-Thoracic- One Fixation and fusion;  Surgeon: Loura Halt Ditty, MD;  Location: [facility] OR;  Service: Neurosurgery;  Laterality: N/A;  Part-1 anterior approach  . CARDIAC CATHETERIZATION  2008; ~ 2011   Colp-40%lad  . CARDIAC CATHETERIZATION N/A 03/16/2016   Procedure: Left Heart Cath and Coronary Angiography;  Surgeon: Marykay Lex, MD;  Location: [facility] INVASIVE CV LAB;  Service: Cardiovascular;  Laterality: N/A;  . COLONOSCOPY WITH PROPOFOL N/A 04/17/2018   Procedure: COLONOSCOPY WITH PROPOFOL;  Surgeon: Graylin Shiver, MD;  Location: [facility] ENDOSCOPY;  Service: Endoscopy;  Laterality: N/A;  . CORONARY ANGIOPLASTY     STENTS  . EYE SURGERY Bilateral    "for double vision"  . INGUINAL HERNIA REPAIR  05/12/2012   Procedure: HERNIA REPAIR INGUINAL ADULT;  Surgeon: Liz Malady, MD;  Location: [facility];  Service: General;  Laterality: Right;  . INGUINAL HERNIA REPAIR Left 02/02/2003   Dr. Violeta Gelinas. repair with mesh. Same procedure done again in  05/22/2004   . INGUINAL HERNIA REPAIR Left ?date 2nd OR  .  POSTERIOR CERVICAL FUSION/FORAMINOTOMY N/A 01/24/2017   Procedure: Cervical Three-Thoracic One-Laminectomy/Foraminotomy with Cervical Three-Thoracic One Fixation and fusion;  Surgeon: Loura Halt Ditty, MD;  Location: [facility] OR;  Service: Neurosurgery;  Laterality: N/A;  Part-2 Posterior approach  . RECONSTRUCT / STABILIZE DISTAL ULNA    . SHOULDER OPEN ROTATOR CUFF REPAIR Left 2010  . SHOULDER SURGERY Left    "injured on the  job; had to cut small bone out"  . THUMB FUSION Right     Prior to Admission medications   Medication Sig Start Date End Date Taking? Authorizing Provider  acetaminophen (TYLENOL) 500 MG tablet Take 1,000 mg by mouth every 6 (six) hours as needed for headache (pain).   Yes [provider]  allopurinol (ZYLOPRIM) 300 MG tablet Take 1 tablet (300 mg total) by mouth daily. 05/02/17  Yes Quentin Angst, MD  CALCIUM-VITAMIN D PO Take 1 tablet by mouth daily.   Yes [provider]  clotrimazole (LOTRIMIN) 1 % cream Apply 1 application topically 2 (two) times daily.   Yes [provider]  divalproex (DEPAKOTE) 250 MG DR tablet Take 250 mg by mouth 3 (three) times daily.   Yes [provider]  DULoxetine (CYMBALTA) 30 MG capsule Take 1 capsule (30 mg total) by mouth 2 (two) times daily. 11/20/17  Yes Anders Simmonds, PA-C  enoxaparin (LOVENOX) 100 MG/ML injection Inject 1 mL (100 mg total) into the skin every 12 (twelve) hours. 04/23/18  Yes Marcine Matar, MD  gabapentin (NEURONTIN) 600 MG tablet Take 600 mg by mouth 2 (two) times daily.  07/24/17  Yes [provider]  hydrOXYzine (ATARAX/VISTARIL) 25 MG tablet TAKE 1 TABLET BY MOUTH EVERY 6 HOURS AS NEEDED FOR ANXIETY 02/05/18  Yes Newlin, Enobong, MD  ibuprofen (ADVIL,MOTRIN) 600 MG tablet Take 600 mg by mouth 2 (two) times daily as needed (pain). Take with food 04/17/18  Yes [provider]  losartan (COZAAR) 100 MG tablet TAKE 1 TABLET BY MOUTH DAILY. 04/07/18  Yes Marcine Matar, MD  meclizine (ANTIVERT) 25 MG tablet 1 tab PO daily x 1 week then daily PRN Patient taking differently: Take 25 mg by mouth daily as needed (vertigo).  03/04/18  Yes Marcine Matar, MD  methocarbamol (ROBAXIN) 750 MG tablet TAKE 1 TABLET BY MOUTH EVERY 8 HOURS AS NEEDED FOR MUSCLE SPASMS. 02/13/18  Yes Newlin, Odette Horns, MD  metoCLOPramide (REGLAN) 10 MG tablet TAKE 1 TABLET BY MOUTH EVERY 6 HOURS AS NEEDED FOR  NAUSEA OR HEADACHE. 04/22/18  Yes Marcine Matar, MD  Multiple Vitamin (MULTIVITAMIN WITH MINERALS) TABS tablet Take 1 tablet by mouth daily.   Yes [provider]  nitroGLYCERIN (NITROSTAT) 0.4 MG SL tablet PLACE 1 TABLET UNDER THE TONGUE EVERY 5 MINUTES AS NEEDED FOR CHEST PAIN 01/13/18  Yes Leone Brand, NP  omeprazole (PRILOSEC) 20 MG capsule Take 20 mg by mouth daily. 01/07/18  Yes [provider]  oxyCODONE-acetaminophen (PERCOCET) 7.5-325 MG tablet Take 1 tablet by mouth every 4 (four) hours as needed for severe pain.   Yes [provider]  Polyethyl Glycol-Propyl Glycol (SYSTANE OP) Place 1 drop into both eyes 2 (two) times daily.   Yes [provider]  rosuvastatin (CRESTOR) 40 MG tablet Take 40 mg by mouth daily.   Yes [provider]  traZODone (DESYREL) 100 MG tablet Take 100 mg by mouth at bedtime as needed for sleep.   Yes [provider]  warfarin (COUMADIN) 5 MG tablet Take 1  tablet (5 mg total) by mouth daily. Patient taking differently: Take 2.5-5 mg by mouth See admin instructions. Take one tablet (5 mg) by mouth on Monday, Wednesday, Friday morning, take 1/2 tablet (2.5 mg) on Sunday, Tuesday, Thursday, Saturday 01/29/18  Yes Jegede, Phylliss Blakes, MD  ALPRAZolam (XANAX) 0.5 MG tablet Take 1 tablet (0.5 mg total) by mouth 2 (two) times daily as needed for anxiety. Patient not taking: Reported on 04/28/2018 12/12/17   Quentin Angst, MD  gabapentin (NEURONTIN) 400 MG capsule Take 1 capsule (400 mg total) 2 (two) times daily by mouth. Patient not taking: Reported on 04/28/2018 09/04/17   Quentin Angst, MD  ibuprofen (ADVIL,MOTRIN) 800 MG tablet Take 1 tablet (800 mg total) by mouth every 8 (eight) hours as needed. Patient not taking: Reported on 04/28/2018 11/20/17   Anders Simmonds, PA-C  nitroGLYCERIN (NITROSTAT) 0.4 MG SL tablet Place 1 tablet (0.4 mg total) under the tongue every 5 (five) minutes as needed for chest  pain. Patient not taking: Reported on 04/11/2018 05/02/17   Quentin Angst, MD  pantoprazole (PROTONIX) 40 MG tablet Take 1 tablet (40 mg total) by mouth at bedtime. Patient not taking: Reported on 04/28/2018 12/04/17   Quentin Angst, MD  famotidine (PEPCID) 20 MG tablet Take 1 tablet (20 mg total) by mouth 2 (two) times daily. 12/04/11 12/30/11  Grant Fontana, PA-C  TACRINE HYDROCHLORIDE PO Take 25 mg by mouth daily.    12/30/11  [provider]    Scheduled Meds: . allopurinol  300 mg Oral Daily  . divalproex  250 mg Oral TID  . insulin aspart  0-15 Units Subcutaneous Q4H  . losartan  25 mg Oral Daily  . multivitamin with minerals  1 tablet Oral Daily  . oxyCODONE  7.5 mg Oral TID  . pantoprazole  40 mg Oral Daily  . rosuvastatin  40 mg Oral QHS   Continuous Infusions: . sodium chloride    . dextrose 5 % and 0.45% NaCl Stopped (04/29/18 0419)  . heparin 1,300 Units/hr (04/29/18 0730)   PRN Meds:.sodium chloride, diphenhydrAMINE, hydrALAZINE, morphine injection, nitroGLYCERIN  Allergies as of 04/26/2018 - Review Complete 04/26/2018  Allergen Reaction Noted  . Shellfish allergy Anaphylaxis 12/04/2011    Family History  Problem Relation Age of Onset  . Heart attack Mother        x7. Still alive  . Heart attack Father        deceased b/c of MI  . Heart attack Brother        older, deceased b/c of MI   . Cancer Brother        bone cancer in 1 brother, ? brain cancer in other brother    Social History   Socioeconomic History  . Marital status: Widowed    Spouse name: Not on file  . Number of children: Not on file  . Years of education: Not on file  . Highest education level: Not on file  Occupational History  . Not on file  Social Needs  . Financial resource strain: Not on file  . Food insecurity:    Worry: Not on file    Inability: Not on file  . Transportation needs:    Medical: Not on file    Non-medical: Not on file  Tobacco Use  .  Smoking status: Never Smoker  . Smokeless tobacco: Never Used  Substance and Sexual Activity  . Alcohol use: Yes    Comment: 16 oz beer every  2-3 days  . Drug use: Yes    Types: "Crack" cocaine, Marijuana, Cocaine    Comment:  "quit 2008"  . Sexual activity: Not Currently  Lifestyle  . Physical activity:    Days per week: Not on file    Minutes per session: Not on file  . Stress: Not on file  Relationships  . Social connections:    Talks on phone: Not on file    Gets together: Not on file    Attends religious service: Not on file    Active member of club or organization: Not on file    Attends meetings of clubs or organizations: Not on file    Relationship status: Not on file  . Intimate partner violence:    Fear of current or ex partner: Not on file    Emotionally abused: Not on file    Physically abused: Not on file    Forced sexual activity: Not on file  Other Topics Concern  . Not on file  Social History Narrative   Lives in [place] with his wife and son.     Review of Systems: Review of Systems  Constitutional: Positive for malaise/fatigue. Negative for chills and fever.  HENT: Negative for hearing loss and tinnitus.   Eyes: Negative for blurred vision and double vision.  Respiratory: Negative for cough, hemoptysis and sputum production.   Cardiovascular: Negative for chest pain and palpitations.  Gastrointestinal: Positive for abdominal pain. Negative for blood in stool, constipation, diarrhea, melena, nausea and vomiting.  Genitourinary: Negative for dysuria and urgency.  Musculoskeletal: Positive for joint pain and myalgias. Negative for back pain.  Skin: Negative for itching and rash.  Neurological: Positive for dizziness. Negative for seizures.  Endo/Heme/Allergies: Does not bruise/bleed easily.  Psychiatric/Behavioral: Negative for hallucinations and suicidal ideas.    Physical Exam: Vital signs: Vitals:   04/29/18 0700 04/29/18 0736  BP: (!) 153/96  (!) 153/96  Pulse: 71   Resp: 11   Temp:  98.3 F (36.8 C)  SpO2: 98%    Last BM Date: 04/26/18 Physical Exam  Constitutional: He is oriented to person, place, and time. He appears well-developed and well-nourished. No distress.  HENT:  Head: Normocephalic and atraumatic.  Mouth/Throat: Oropharynx is clear and moist. No oropharyngeal exudate.  Poor dentition  Eyes: EOM are normal. No scleral icterus.  Neck: Normal range of motion. Neck supple.  Cardiovascular: Normal rate and regular rhythm.  Pulmonary/Chest: Effort normal and breath sounds normal. No respiratory distress.  Abdominal: Soft. Bowel sounds are normal. He exhibits no distension. There is no rebound and no guarding.  Right upper quadrant tenderness with generalized discomfort on palpation  Musculoskeletal: Normal range of motion. He exhibits edema.  Neurological: He is alert and oriented to person, place, and time.  Skin: Skin is warm. No erythema.  Psychiatric: Judgment and thought content normal.  Vitals reviewed.   GI:  Lab Results: Recent Labs    04/27/18 0632 04/28/18 0224 04/29/18 0239  WBC 4.9 4.5 4.0  HGB 13.1 12.5* 12.2*  HCT 41.5 39.6 38.4*  PLT 178 179 175   BMET Recent Labs    04/27/18 0632 04/28/18 0224 04/29/18 0239  NA 142 142 141  K 4.0 4.0 3.9  CL 113* 113* 109  CO2 22 25 26   GLUCOSE 123* 122* 93  BUN 29* 14 6*  CREATININE 2.03* 1.41* 1.13  CALCIUM 7.7* 8.0* 8.7*   LFT Recent Labs    04/28/18 0832 04/29/18 0239  PROT 5.5* 5.5*  ALBUMIN 3.0* 2.9*  AST 22 17  ALT 20 19  ALKPHOS 40 38  BILITOT 0.5 0.7  BILIDIR 0.1  --   IBILI 0.4  --    PT/INR Recent Labs    04/28/18 0224 04/29/18 0239  LABPROT 25.6* 19.7*  INR 2.36 1.68     Studies/Results: Nm Hepato W/eject Fract  Result Date: 04/28/2018 CLINICAL DATA:  RIGHT upper quadrant pain question cholecystitis EXAM: NUCLEAR MEDICINE HEPATOBILIARY IMAGING WITH GALLBLADDER EF TECHNIQUE: Sequential images of the abdomen  were obtained out to 60 minutes following intravenous administration of radiopharmaceutical. After oral ingestion of Ensure, gallbladder ejection fraction was determined. At 60 min, normal ejection fraction is greater than 33%. RADIOPHARMACEUTICALS:  5.0 mCi Tc-27m  Choletec IV COMPARISON:  None FINDINGS: Normal tracer extraction from bloodstream indicating normal hepatocellular function. Normal excretion of tracer into biliary tree. Gallbladder visualized at 10 min. Small bowel visualized at 52 min. No hepatic retention of tracer. Subjectively normal emptying of tracer from gallbladder following fatty meal stimulation. Calculated gallbladder ejection fraction is 46%, normal. Patient reported no symptoms following Ensure ingestion. Normal gallbladder ejection fraction following Ensure ingestion is greater than 33% at 1 hour. IMPRESSION: Patent biliary tree. Normal gallbladder response to fatty meal stimulation with a normal gallbladder ejection fraction of 46%. Electronically Signed   By: Ulyses Southward M.D.   On: 04/28/2018 19:10   Dg Abd Portable 1v  Result Date: 04/28/2018 CLINICAL DATA:  62 year old male with a history of abdominal pain EXAM: PORTABLE ABDOMEN - 1 VIEW COMPARISON:  04/27/2018 FINDINGS: Interval removal of the gastric tube. Interval removal of defibrillator pads. Gas within stomach, small bowel, colon. No abnormal distention. Gas extends to the rectum. No unexpected radiopaque foreign body. No unexpected soft tissue density. No unexpected calcification. No acute displaced fracture IMPRESSION: Nonobstructive bowel gas pattern. Electronically Signed   By: Gilmer Mor D.O.   On: 04/28/2018 08:43   US Abdomen Limited Ruq  Result Date: 04/28/2018 CLINICAL DATA:  RIGHT upper quadrant pain. EXAM: ULTRASOUND ABDOMEN LIMITED RIGHT UPPER QUADRANT COMPARISON:  Hepatobiliary scan April 28, 2018 and CT abdomen and pelvis April 27, 2018. FINDINGS: Gallbladder: No gallstones or wall thickening visualized. No  sonographic Murphy sign noted by sonographer. Common bile duct: Diameter: 3 mm. Liver: No focal lesion identified. Within normal limits in parenchymal echogenicity. Portal vein is patent on color Doppler imaging with normal direction of blood flow towards the liver. IMPRESSION: Negative RIGHT upper quadrant ultrasound. Electronically Signed   By: Awilda Metro M.D.   On: 04/28/2018 21:47    Impression/Plan: - right upper quadrant abdominal pain and previous Lovenox injection sites.normal CBC on admission. Normal lipase.normal LFTs.ultrasound right upper quadrant negative.normal HIDA scan.CT abdomen pelvis without contrast 2 days ago on admission revealed Patchy areas of infiltration and emphysema in the subcutaneous fat of the anterior abdominal wall, likely representing subcutaneous injection sites. - heart block. Followed by cardiology. - Coronary artery disease. - PE. Coumadin on hold. Currently on heparin drip. - history of rectal bleeding. Colonoscopy 04/17/2018 negative for active bleeding. No polyps were seen.Diverticulosis and hemorrhoids were seen.    Recommendations --------------------------- - Abdominal pain is nonspecific. Could be panniculitis from repeated Lovenox injection. No leukocytosis. Normal LFTs and lipase. Negative ultrasound and HIDA scan. - I think it is okay to repeat CT scan with IV contrast because of ongoing discomfort to rule out underlying ischemia  - continue PPI. Discussed with Dr. Thedore Mins as well as with the nursing staff. Colonoscopy report from [facility]  EHR  reviewed. - GI will follow.    LOS: 2 days   Kathi Der  MD, FACP 04/29/2018, 8:16 AM  Contact #  [phone]

## 2018-04-30 ENCOUNTER — Inpatient Hospital Stay (HOSPITAL_COMMUNITY): Payer: Medicaid Other

## 2018-04-30 ENCOUNTER — Encounter (HOSPITAL_COMMUNITY): Payer: Self-pay | Admitting: Cardiology

## 2018-04-30 DIAGNOSIS — K559 Vascular disorder of intestine, unspecified: Secondary | ICD-10-CM

## 2018-04-30 DIAGNOSIS — I25119 Atherosclerotic heart disease of native coronary artery with unspecified angina pectoris: Secondary | ICD-10-CM

## 2018-04-30 LAB — URINALYSIS, ROUTINE W REFLEX MICROSCOPIC
Bilirubin Urine: NEGATIVE
Glucose, UA: NEGATIVE mg/dL
Hgb urine dipstick: NEGATIVE
Ketones, ur: NEGATIVE mg/dL
LEUKOCYTES UA: NEGATIVE
Nitrite: NEGATIVE
PROTEIN: NEGATIVE mg/dL
Specific Gravity, Urine: 1.004 — ABNORMAL LOW (ref 1.005–1.030)
pH: 8 (ref 5.0–8.0)

## 2018-04-30 LAB — COMPREHENSIVE METABOLIC PANEL
ALBUMIN: 3.2 g/dL — AB (ref 3.5–5.0)
ALK PHOS: 44 U/L (ref 38–126)
ALT: 23 U/L (ref 0–44)
ANION GAP: 9 (ref 5–15)
AST: 26 U/L (ref 15–41)
BUN: 5 mg/dL — ABNORMAL LOW (ref 8–23)
CALCIUM: 9.1 mg/dL (ref 8.9–10.3)
CO2: 26 mmol/L (ref 22–32)
Chloride: 104 mmol/L (ref 98–111)
Creatinine, Ser: 1.2 mg/dL (ref 0.61–1.24)
GFR calc Af Amer: >60 mL/min (ref >60)
GFR calc non Af Amer: >60 mL/min (ref >60)
GLUCOSE: 104 mg/dL — AB (ref 70–99)
Potassium: 3.9 mmol/L (ref 3.5–5.1)
SODIUM: 139 mmol/L (ref 135–145)
Total Bilirubin: 0.5 mg/dL (ref 0.3–1.2)
Total Protein: 6.5 g/dL (ref 6.5–8.1)

## 2018-04-30 LAB — GLUCOSE, CAPILLARY
GLUCOSE-CAPILLARY: 89 mg/dL (ref 70–99)
GLUCOSE-CAPILLARY: 108 mg/dL — AB (ref 70–99)
Glucose-Capillary: 103 mg/dL — ABNORMAL HIGH (ref 70–99)
Glucose-Capillary: 122 mg/dL — ABNORMAL HIGH (ref 70–99)
Glucose-Capillary: 85 mg/dL (ref 70–99)
Glucose-Capillary: 92 mg/dL (ref 70–99)

## 2018-04-30 LAB — HEPARIN LEVEL (UNFRACTIONATED)
HEPARIN UNFRACTIONATED: 0.38 [IU]/mL (ref 0.30–0.70)
HEPARIN UNFRACTIONATED: 0.32 [IU]/mL (ref 0.30–0.70)

## 2018-04-30 LAB — CBC
HCT: 44.1 % (ref 39.0–52.0)
HEMOGLOBIN: 14.1 g/dL (ref 13.0–17.0)
MCH: 29.7 pg (ref 26.0–34.0)
MCHC: 32 g/dL (ref 30.0–36.0)
MCV: 93 fL (ref 78.0–100.0)
Platelets: 196 10*3/uL (ref 150–400)
RBC: 4.74 MIL/uL (ref 4.22–5.81)
RDW: 13.6 % (ref 11.5–15.5)
WBC: 5.4 10*3/uL (ref 4.0–10.5)

## 2018-04-30 LAB — PROTIME-INR
INR: 1.4
Prothrombin Time: 17.1 seconds — ABNORMAL HIGH (ref 11.4–15.2)

## 2018-04-30 MED ORDER — WARFARIN SODIUM 5 MG PO TABS
5.0000 mg | ORAL_TABLET | Freq: Once | ORAL | Status: AC
  Administered 2018-04-30: 5 mg via ORAL
  Filled 2018-04-30: qty 1

## 2018-04-30 MED ORDER — IOHEXOL 300 MG/ML  SOLN
100.0000 mL | Freq: Once | INTRAMUSCULAR | Status: AC | PRN
  Administered 2018-04-30: 100 mL via INTRAVENOUS

## 2018-04-30 MED ORDER — INSULIN ASPART 100 UNIT/ML ~~LOC~~ SOLN: 0.0000 [IU] | Freq: Three times a day (TID) | SUBCUTANEOUS | Status: DC

## 2018-04-30 MED ORDER — ACETAMINOPHEN 325 MG PO TABS: 650.0000 mg | ORAL_TABLET | Freq: Four times a day (QID) | ORAL | Status: DC | PRN

## 2018-04-30 MED ORDER — WARFARIN - PHARMACIST DOSING INPATIENT: Freq: Every day | Status: DC

## 2018-04-30 NOTE — Progress Notes (Signed)
ANTICOAGULATION CONSULT NOTE - Follow-Up Consult  Pharmacy Consult for Warfarin>>>Heparin  Indication: pulmonary embolus/CVA/CP  Allergies  Allergen Reactions  . Shellfish Allergy Anaphylaxis   Patient Measurements: Height: 5\' 11"  (180.3 cm) Weight: 207 lb 4.8 oz (94 kg) IBW/kg (Calculated) : 75.3  Vital Signs: Temp: 98 F (36.7 C) (07/10 0312) Temp Source: Oral (07/10 0312) BP: 164/91 (07/10 0312) Pulse Rate: 66 (07/10 0312)  Labs: Recent Labs    04/28/18 0224 04/28/18 6712 04/28/18 1406 04/28/18 2016 04/29/18 0239 04/30/18 0245  HGB 12.5*  --   --   --  12.2* 14.1  HCT 39.6  --   --   --  38.4* 44.1  PLT 179  --   --   --  175 196  LABPROT 25.6*  --   --   --  19.7* 17.1*  INR 2.36  --   --   --  1.68 1.40  HEPARINUNFRC  --   --   --   --   --  0.38  CREATININE 1.41*  --   --   --  1.13 1.20  TROPONINI <0.03 <0.03 <0.03 <0.03  --   --     Estimated Creatinine Clearance: 73.8 mL/min (by C-G formula based on SCr of 1.2 mg/dL).   Medical History: Past Medical History:  Diagnosis Date  . Anginal pain (HCC)   . Anxiety   . Arthritis   . Bipolar disorder (HCC)   . Blood in stool   . CAD (coronary artery disease)    a. 2011: cath showing mod non-obstructive disease b. 02/2016: cath with 95% stenosis in the Mid RCA. DES placed.  . Cervical stenosis of spine   . Cocaine abuse (HCC)    history  . Depression   . Diabetes mellitus without complication (HCC)   . Gout, unspecified   . Headache   . HTN (hypertension)   . Hypercholesterolemia   . Hyperglycemia   . Migraines   . Myocardial infarction [facility]) 2008   drug cocaine and marijuana at time of mi none  since    . Pneumonia   . Rectal bleeding   . Renal insufficiency   . Seizures (HCC)   . Stroke [facility])    Assessment: 75 yoM admitted with bradycardia. Pt on warfarin PTA for hx PE and CVA. Warfarin resumed 7/7, then held for procedure. INR now 1.68. Per cards, likely no indication for PPM but now s/p cath  7/9. Pharmacy consulted to dose heparin 8 hours post-sheath removal for hx PE. CBC stable. No bleed documented. Sheath removed at 1146. Heparin level this am is therapeutic  Goal of Therapy:  Heparin level 0.3-0.7 units/mL Monitor platelets by anticoagulation protocol: Yes   Plan:  Continue heparin drip at 1300 units/hr  Monitor daily heparin level and CBC, s/sx bleeding F/u resume warfarin as appropriate when all procedures complete  Thanks for allowing pharmacy to be a part of this patient's care.  Talbert Cage, PharmD Clinical Pharmacist  04/30/2018 4:07 AM

## 2018-04-30 NOTE — Progress Notes (Signed)
PROGRESS NOTE  Russell Carter BTD:[id number] DOB: Apr 23, 1956 DOA: 04/26/2018 PCP: Marcine Matar, MD   LOS: 3 days   Brief Narrative / Interim history: 62 year old male with history of coronary artery disease with DES to RCA in 2017, hypertension, hyperlipidemia, PE on chronic Coumadin who presented to the hospital on 7/6 with right upper quadrant abdominal pain.  The ER he was found to be bradycardic.  Initially he was admitted to the ICU and critical care was consulted.  He was transferred from ICU to the hospitalist service on 7/8 with ongoing right upper quadrant abdominal pain  Assessment & Plan: Principal Problem:   Abdominal pain Active Problems:   CAD-50% LAD 2011   Complete heart block (HCC)   Syncope   Acute kidney injury (HCC)   Lactic acidosis   Chronic anticoagulation   Right upper quadrant abdominal pain -Unclear etiology, noncontrasted CT in the ER was unremarkable.  Repeat CT scan today again unremarkable general surgery followed, -Normal HIDA scan normal ultrasound, surgery signed off.  Appreciate GI involvement. -The scan on admission showed patchy areas of infiltration and emphysema in the subcutaneous fat of the anterior abdominal wall, likely representing subcutaneous injection sites.  Coronary artery disease -Underwent cardiac catheterization on 7/9, the mid RCA stent is patent, no other significant obstructions, appreciate cardiology follow-up  Bradycardia -Questionable complete heart block/second-degree AV block, cardiology following, stable now, seems like it is more Wenckebach.  No further work-up per cardiology.  Lethargy   -Likely due to bradycardia which in turn likely was caused by vagal response to abdominal pain, currently stable, TSH stable.   H/O PE   -continue Coumadin / heparin bridge  Dyslipidemia  -Continue statin.  History of cocaine abuse -Says does not do it anymore.  Tox screen negative.  Will monitor.  DM type II   -On  sliding scale.   DVT prophylaxis: Coumadin Code Status: Full code Family Communication: fiancee at bedside Disposition Plan: home when ready   Consultants:   Cardiology  General surgery  Gastroenterology  Procedures:  2D echo: Impressions: - Normal LV systolic function; mild LVH; moderate diastolic dysfunction.  Cardiac cath  Previously placed Mid RCA drug eluting stent is widely patent.  LV end diastolic pressure is mildly elevated.  Antimicrobials:  None    Subjective: -Complains of lower back pain every time he urinates, right abdomen still sore.  No chest pain or breathing difficulties.  No nausea or vomiting.  Objective: Vitals:   04/30/18 0312 04/30/18 0438 04/30/18 0911 04/30/18 1129  BP: (!) 164/91 (!) 157/93 (!) 150/74 (!) 148/91  Pulse: 66 75 91 75  Resp: 18 16 18 18   Temp: 98 F (36.7 C)   98.4 F (36.9 C)  TempSrc: Oral   Oral  SpO2: 98% 98% 97% 97%  Weight: 94 kg (207 lb 4.8 oz)     Height:        Intake/Output Summary (Last 24 hours) at 04/30/2018 1427 Last data filed at 04/30/2018 1342 Gross per 24 hour  Intake 2077.04 ml  Output 2625 ml  Net -547.96 ml   Filed Weights   04/29/18 0500 04/29/18 1224 04/30/18 0312  Weight: 94.7 kg (208 lb 12.4 oz) 94.7 kg (208 lb 12.4 oz) 94 kg (207 lb 4.8 oz)    Examination:  Constitutional: NAD Eyes:  lids and conjunctivae normal ENMT: Mucous membranes are moist.  Respiratory: clear to auscultation bilaterally, no wheezing, no crackles. Normal respiratory effort. No accessory muscle use.  Cardiovascular: Regular rate and  rhythm, no murmurs / rubs / gallops. No LE edema. 2+ pedal pulses. No carotid bruits.  Abdomen: Tender to palpation right upper quadrant, some left upper quadrant tenderness also present.  No guarding, no rebound Musculoskeletal: no clubbing / cyanosis.  Skin: no rashes Neurologic: Nonfocal    Data Reviewed: I have independently reviewed following labs and imaging studies    CBC: Recent Labs  Lab 05/15/2018 2326 2018/05/15 2345 04/27/18 1610 04/28/18 0224 04/29/18 0239 04/30/18 0245  WBC 6.1  --  4.9 4.5 4.0 5.4  NEUTROABS 2.5  --   --   --   --   --   HGB 13.8 14.3 13.1 12.5* 12.2* 14.1  HCT 43.7 42.0 41.5 39.6 38.4* 44.1  MCV 94.4  --  93.7 93.8 93.7 93.0  PLT 197  --  178 179 175 196   Basic Metabolic Panel: Recent Labs  Lab 05-15-2018 2326 May 15, 2018 2345 04/27/18 0316 04/27/18 0632 04/28/18 0224 04/29/18 0239 04/30/18 0245  NA 137 139  --  142 142 141 139  K 3.8 3.8  --  4.0 4.0 3.9 3.9  CL 112* 110  --  113* 113* 109 104  CO2 13*  --   --  22 25 26 26   GLUCOSE 113* 112*  --  123* 122* 93 104*  BUN 35* 38*  --  29* 14 6* 5*  CREATININE 2.84* 2.90*  --  2.03* 1.41* 1.13 1.20  CALCIUM 8.0*  --   --  7.7* 8.0* 8.7* 9.1  MG  --   --  2.0 2.2 2.0  --   --   PHOS  --   --   --  4.3 2.8  --   --    GFR: Estimated Creatinine Clearance: 73.8 mL/min (by C-G formula based on SCr of 1.2 mg/dL). Liver Function Tests: Recent Labs  Lab 05-15-18 2326 04/27/18 9604 04/28/18 0832 04/29/18 0239 04/30/18 0245  AST 23 22 22 17 26   ALT 24 22 20 19 23   ALKPHOS 42 42 40 38 44  BILITOT 0.5 0.5 0.5 0.7 0.5  PROT 6.1* 5.8* 5.5* 5.5* 6.5  ALBUMIN 3.5 3.2* 3.0* 2.9* 3.2*   Recent Labs  Lab 04/28/18 0832  LIPASE 26   No results for input(s): AMMONIA in the last 168 hours. Coagulation Profile: Recent Labs  Lab 04/25/18 0938 May 15, 2018 2326 04/28/18 0224 04/29/18 0239 04/30/18 0245  INR 1.4* 1.98 2.36 1.68 1.40   Cardiac Enzymes: Recent Labs  Lab 15-May-2018 2326  04/27/18 1852 04/28/18 0224 04/28/18 0832 04/28/18 1406 04/28/18 2016  CKTOTAL 544*  --   --   --   --   --   --   TROPONINI <0.03   < > <0.03 <0.03 <0.03 <0.03 <0.03   < > = values in this interval not displayed.   BNP (last 3 results) No results for input(s): PROBNP in the last 8760 hours. HbA1C: No results for input(s): HGBA1C in the last 72 hours. CBG: Recent Labs  Lab  04/29/18 2029 04/30/18 0011 04/30/18 0317 04/30/18 0726 04/30/18 1126  GLUCAP 88 92 85 122* 103*   Lipid Profile: No results for input(s): CHOL, HDL, LDLCALC, TRIG, CHOLHDL, LDLDIRECT in the last 72 hours. Thyroid Function Tests: No results for input(s): TSH, T4TOTAL, FREET4, T3FREE, THYROIDAB in the last 72 hours. Anemia Panel: No results for input(s): VITAMINB12, FOLATE, FERRITIN, TIBC, IRON, RETICCTPCT in the last 72 hours. Urine analysis:    Component Value Date/Time   COLORURINE STRAW (A) 04/30/2018  4098   APPEARANCEUR CLEAR 04/30/2018 0838   LABSPEC 1.004 (L) 04/30/2018 0838   PHURINE 8.0 04/30/2018 0838   GLUCOSEU NEGATIVE 04/30/2018 0838   HGBUR NEGATIVE 04/30/2018 0838   BILIRUBINUR NEGATIVE 04/30/2018 0838   KETONESUR NEGATIVE 04/30/2018 0838   PROTEINUR NEGATIVE 04/30/2018 0838   UROBILINOGEN 1.0 02/04/2015 0647   NITRITE NEGATIVE 04/30/2018 0838   LEUKOCYTESUR NEGATIVE 04/30/2018 0838   Sepsis Labs: Invalid input(s): PROCALCITONIN, LACTICIDVEN  Recent Results (from the past 240 hour(s))  MRSA PCR Screening     Status: None   Collection Time: 04/27/18  6:18 AM  Result Value Ref Range Status   MRSA by PCR NEGATIVE NEGATIVE Final    Comment:        The GeneXpert MRSA Assay (FDA approved for NASAL specimens only), is one component of a comprehensive MRSA colonization surveillance program. It is not intended to diagnose MRSA infection nor to guide or monitor treatment for MRSA infections. Performed at [facility], [address] [place], [place] [address]       Radiology Studies: Ct Abdomen Pelvis W Contrast  Result Date: 04/30/2018 CLINICAL DATA:  Abdominal pain and bradycardia. Worsening chronic kidney disease with metabolic acidosis. EXAM: CT ABDOMEN AND PELVIS WITH CONTRAST TECHNIQUE: Multidetector CT imaging of the abdomen and pelvis was performed using the standard protocol following bolus administration of intravenous contrast. CONTRAST:   OMNIPAQUE IOHEXOL 300 MG/ML  SOLN COMPARISON:  Noncontrast CT 04/27/2018. FINDINGS: Lower chest: Mild atelectasis at both lung bases, similar to previous study. There is trace pleural fluid on the left. There is no pericardial effusion. Coronary artery calcifications and possible aortic valvular calcifications are noted. Hepatobiliary: The liver is normal in density without focal abnormality. No evidence of gallstones, gallbladder wall thickening or biliary dilatation. Pancreas: Unremarkable. No pancreatic ductal dilatation or surrounding inflammatory changes. Spleen: Normal in size without focal abnormality. Adrenals/Urinary Tract: Both adrenal glands appear normal. There are small cysts in the lower pole of the right kidney. No evidence of renal mass, urinary tract calculus or hydronephrosis. No significant bladder findings. Stomach/Bowel: No evidence of bowel wall thickening, distention or surrounding inflammatory change. The appendix appears normal. Vascular/Lymphatic: There are no enlarged abdominal or pelvic lymph nodes. Small inguinal lymph nodes are stable. Minimal iliac atherosclerosis without acute vascular findings. Reproductive: The prostate gland appears stable. Other: Interval mild left hemidiaphragm elevation. There are stable sequela of probable subcutaneous injections within the subcutaneous fat of the anterior abdominal wall and stable probable postsurgical changes in the left inguinal region. No recurrent hernia seen. Musculoskeletal: No acute or significant osseous findings. Prominent facet hypertrophy in the lower lumbar spine. IMPRESSION: 1. No acute findings or explanation for the patient's symptoms. 2. Interval increased left hemidiaphragm elevation with similar atelectasis at both lung bases. 3. Stable findings in the subcutaneous fat of the anterior abdominal wall, attributed to subcutaneous injections. 4. Coronary artery atherosclerosis. Electronically Signed   By: Carey Bullocks  M.D.   On: 04/30/2018 11:32   Nm Hepato W/eject Fract  Result Date: 04/28/2018 CLINICAL DATA:  RIGHT upper quadrant pain question cholecystitis EXAM: NUCLEAR MEDICINE HEPATOBILIARY IMAGING WITH GALLBLADDER EF TECHNIQUE: Sequential images of the abdomen were obtained out to 60 minutes following intravenous administration of radiopharmaceutical. After oral ingestion of Ensure, gallbladder ejection fraction was determined. At 60 min, normal ejection fraction is greater than 33%. RADIOPHARMACEUTICALS:  5.0 mCi Tc-34m  Choletec IV COMPARISON:  None FINDINGS: Normal tracer extraction from bloodstream indicating normal hepatocellular function. Normal excretion  of tracer into biliary tree. Gallbladder visualized at 10 min. Small bowel visualized at 52 min. No hepatic retention of tracer. Subjectively normal emptying of tracer from gallbladder following fatty meal stimulation. Calculated gallbladder ejection fraction is 46%, normal. Patient reported no symptoms following Ensure ingestion. Normal gallbladder ejection fraction following Ensure ingestion is greater than 33% at 1 hour. IMPRESSION: Patent biliary tree. Normal gallbladder response to fatty meal stimulation with a normal gallbladder ejection fraction of 46%. Electronically Signed   By: Ulyses Southward M.D.   On: 04/28/2018 19:10   US Abdomen Limited Ruq  Result Date: 04/28/2018 CLINICAL DATA:  RIGHT upper quadrant pain. EXAM: ULTRASOUND ABDOMEN LIMITED RIGHT UPPER QUADRANT COMPARISON:  Hepatobiliary scan April 28, 2018 and CT abdomen and pelvis April 27, 2018. FINDINGS: Gallbladder: No gallstones or wall thickening visualized. No sonographic Murphy sign noted by sonographer. Common bile duct: Diameter: 3 mm. Liver: No focal lesion identified. Within normal limits in parenchymal echogenicity. Portal vein is patent on color Doppler imaging with normal direction of blood flow towards the liver. IMPRESSION: Negative RIGHT upper quadrant ultrasound. Electronically Signed    By: Awilda Metro M.D.   On: 04/28/2018 21:47     Scheduled Meds: . allopurinol  300 mg Oral Daily  . divalproex  250 mg Oral TID  . insulin aspart  0-15 Units Subcutaneous TID AC & HS  . multivitamin with minerals  1 tablet Oral Daily  . oxyCODONE  7.5 mg Oral TID  . pantoprazole  40 mg Oral BID  . rosuvastatin  40 mg Oral QHS  . sodium chloride flush  3 mL Intravenous Q12H   Continuous Infusions: . sodium chloride    . sodium chloride 250 mL (04/29/18 2045)  . heparin 1,300 Units/hr (04/29/18 2045)     Pamella Pert, MD, PhD [facility] Pager [phone]  If 7PM-7AM, please contact night-coverage [email or web address] Password [facility] 04/30/2018, 2:27 PM

## 2018-04-30 NOTE — Progress Notes (Signed)
ANTICOAGULATION CONSULT NOTE - Follow-Up Consult  Pharmacy Consult for Warfarin>>>Heparin  Indication: pulmonary embolus/CVA/CP  Allergies  Allergen Reactions  . Shellfish Allergy Anaphylaxis   Patient Measurements: Height: 5\' 11"  (180.3 cm) Weight: 207 lb 4.8 oz (94 kg) IBW/kg (Calculated) : 75.3  Vital Signs: Temp: 98 F (36.7 C) (07/10 0312) Temp Source: Oral (07/10 0312) BP: 150/74 (07/10 0911) Pulse Rate: 91 (07/10 0911)  Labs: Recent Labs    04/28/18 0224 04/28/18 2446 04/28/18 1406 04/28/18 2016 04/29/18 0239 04/30/18 0245 04/30/18 0957  HGB 12.5*  --   --   --  12.2* 14.1  --   HCT 39.6  --   --   --  38.4* 44.1  --   PLT 179  --   --   --  175 196  --   LABPROT 25.6*  --   --   --  19.7* 17.1*  --   INR 2.36  --   --   --  1.68 1.40  --   HEPARINUNFRC  --   --   --   --   --  0.38 0.32  CREATININE 1.41*  --   --   --  1.13 1.20  --   TROPONINI <0.03 <0.03 <0.03 <0.03  --   --   --     Estimated Creatinine Clearance: 73.8 mL/min (by C-G formula based on SCr of 1.2 mg/dL).   Medical History: Past Medical History:  Diagnosis Date  . Anginal pain (HCC)   . Anxiety   . Arthritis   . Bipolar disorder (HCC)   . Blood in stool   . CAD (coronary artery disease)    a. 2011: cath showing mod non-obstructive disease b. 02/2016: cath with 95% stenosis in the Mid RCA. DES placed.  . Cervical stenosis of spine   . Cocaine abuse (HCC)    history  . Depression   . Diabetes mellitus without complication (HCC)   . Gout, unspecified   . Headache   . HTN (hypertension)   . Hypercholesterolemia   . Hyperglycemia   . Migraines   . Myocardial infarction [facility]) 2008   drug cocaine and marijuana at time of mi none  since    . Pneumonia   . Rectal bleeding   . Renal insufficiency   . Seizures (HCC)   . Stroke [facility])    Assessment: 81 yoM admitted with bradycardia. Pt on warfarin PTA for hx PE and CVA. Warfarin resumed 7/7, then held for procedure. INR now 1.40  Per cards, likely no indication for PPM but now s/p cath 7/9. Pharmacy consulted to dose heparin 8 hours post-sheath removal for hx PE. CBC stable. No bleed documented. Sheath removed at 1146. Heparin level remains therapeutic  Goal of Therapy:  Heparin level 0.3-0.7 units/mL Monitor platelets by anticoagulation protocol: Yes   Plan:  Continue heparin drip at 1300 units/hr  Monitor daily heparin level and CBC, s/sx bleeding F/u resume warfarin as appropriate when all procedures complete  Thanks for allowing pharmacy to be a part of this patient's care.  Bradley Ferris, PharmD PGY1 Pharmacy Resident Direct Phone: [phone] 04/30/2018  11:09 AM

## 2018-04-30 NOTE — Progress Notes (Signed)
ANTICOAGULATION CONSULT NOTE - Follow-Up Consult  Pharmacy Consult for Warfarin>>>Heparin  Indication: pulmonary embolus/CVA/CP  Allergies  Allergen Reactions  . Shellfish Allergy Anaphylaxis   Patient Measurements: Height: 5\' 11"  (180.3 cm) Weight: 207 lb 4.8 oz (94 kg) IBW/kg (Calculated) : 75.3  Vital Signs: Temp: 98.4 F (36.9 C) (07/10 1129) Temp Source: Oral (07/10 1129) BP: 148/91 (07/10 1129) Pulse Rate: 75 (07/10 1129)  Labs: Recent Labs    04/28/18 0224 04/28/18 6378 04/28/18 1406 04/28/18 2016 04/29/18 0239 04/30/18 0245 04/30/18 0957  HGB 12.5*  --   --   --  12.2* 14.1  --   HCT 39.6  --   --   --  38.4* 44.1  --   PLT 179  --   --   --  175 196  --   LABPROT 25.6*  --   --   --  19.7* 17.1*  --   INR 2.36  --   --   --  1.68 1.40  --   HEPARINUNFRC  --   --   --   --   --  0.38 0.32  CREATININE 1.41*  --   --   --  1.13 1.20  --   TROPONINI <0.03 <0.03 <0.03 <0.03  --   --   --     Estimated Creatinine Clearance: 73.8 mL/min (by C-G formula based on SCr of 1.2 mg/dL).   Medical History: Past Medical History:  Diagnosis Date  . Anginal pain (HCC)   . Anxiety   . Arthritis   . Bipolar disorder (HCC)   . Blood in stool   . CAD (coronary artery disease)    a. 2011: cath showing mod non-obstructive disease b. 02/2016: cath with 95% stenosis in the Mid RCA. DES placed.  . Cervical stenosis of spine   . Cocaine abuse (HCC)    history  . Depression   . Diabetes mellitus without complication (HCC)   . Gout, unspecified   . Headache   . HTN (hypertension)   . Hypercholesterolemia   . Hyperglycemia   . Migraines   . Myocardial infarction [facility]) 2008   drug cocaine and marijuana at time of mi none  since    . Pneumonia   . Rectal bleeding   . Renal insufficiency   . Seizures (HCC)   . Stroke [facility])    Assessment: 66 yoM admitted with bradycardia. Pt on warfarin PTA for hx PE and CVA. Warfarin resumed 7/7, then held for procedure. Per cards,  likely no indication for PPM but now s/p cath 7/9. Pharmacy consulted to dose heparin for hx PE post-cath and resume warfarin 7/10.  INR now down to 1.4. CBC stable. No bleed documented.  PTA warfarin dose: 2.5mg  except 5mg  on MWF  Goal of Therapy:  Heparin level 0.3-0.7 units/mL INR 2-3 Monitor platelets by anticoagulation protocol: Yes   Plan:  Continue heparin drip at 1300 units/hr Warfarin 5mg  PO x 1 Monitor daily heparin level/INR/CBC, s/sx bleeding  Babs Bertin, PharmD, BCPS Clinical Pharmacist Clinical phone [phone] Please check AMION for all [facility] Pharmacy contact numbers 04/30/2018 2:40 PM

## 2018-04-30 NOTE — Plan of Care (Signed)
  Problem: Education: Goal: Knowledge of General Education information will improve Outcome: Progressing   Problem: Clinical Measurements: Goal: Ability to maintain clinical measurements within normal limits will improve Outcome: Progressing Goal: Will remain free from infection Outcome: Progressing Goal: Diagnostic test results will improve Outcome: Progressing Goal: Respiratory complications will improve Outcome: Progressing Goal: Cardiovascular complication will be avoided Outcome: Progressing   Problem: Activity: Goal: Risk for activity intolerance will decrease Outcome: Progressing   Problem: Nutrition: Goal: Adequate nutrition will be maintained Outcome: Progressing   Problem: Coping: Goal: Level of anxiety will decrease Outcome: Progressing   Problem: Elimination: Goal: Will not experience complications related to bowel motility Outcome: Progressing Goal: Will not experience complications related to urinary retention Outcome: Progressing   Problem: Pain Managment: Goal: General experience of comfort will improve Outcome: Progressing   Problem: Safety: Goal: Ability to remain free from injury will improve Outcome: Progressing   Problem: Skin Integrity: Goal: Risk for impaired skin integrity will decrease Outcome: Progressing   

## 2018-04-30 NOTE — Progress Notes (Signed)
[facility] Gastroenterology Progress Note  Yochanan Whittier 62 y.o. 02/03/1956  CC:  Abdominal pain   Subjective: he continues to have right upper quadrant abdominal pain. Pain is not associated with food anymore. Complaining of nausea but denied any vomiting.   Objective: Vital signs in last 24 hours: Vitals:   04/30/18 0911 04/30/18 1129  BP: (!) 150/74 (!) 148/91  Pulse: 91 75  Resp: 18 18  Temp:  98.4 F (36.9 C)  SpO2: 97% 97%    Physical Exam:   GEN - alert and oriented 3. Not in acute distress Abdomen. Mildly distended. Mild right upper quadrant tenderness to palpation. Bowel sounds present. No peritoneal signs  Lab Results: Recent Labs    04/28/18 0224 04/29/18 0239 04/30/18 0245  NA 142 141 139  K 4.0 3.9 3.9  CL 113* 109 104  CO2 25 26 26   GLUCOSE 122* 93 104*  BUN 14 6* 5*  CREATININE 1.41* 1.13 1.20  CALCIUM 8.0* 8.7* 9.1  MG 2.0  --   --   PHOS 2.8  --   --    Recent Labs    04/29/18 0239 04/30/18 0245  AST 17 26  ALT 19 23  ALKPHOS 38 44  BILITOT 0.7 0.5  PROT 5.5* 6.5  ALBUMIN 2.9* 3.2*   Recent Labs    04/29/18 0239 04/30/18 0245  WBC 4.0 5.4  HGB 12.2* 14.1  HCT 38.4* 44.1  MCV 93.7 93.0  PLT 175 196   Recent Labs    04/29/18 0239 04/30/18 0245  LABPROT 19.7* 17.1*  INR 1.68 1.40      Assessment/Plan: - right upper quadrant abdominal pain and previous Lovenox injection sites.normal CBC . Normal lipase.normal LFTs.ultrasound right upper quadrant negative.normal HIDA scan.CT abdomen pelvis without contrast 2 days ago on admission revealed Patchy areas of infiltration and emphysema in the subcutaneous fat of the anterior abdominal wall, likely representing subcutaneous injection sites. - heart block. Followed by cardiology. - Coronary artery disease. - PE. Coumadin on hold. Currently on heparin drip. - history of rectal bleeding. Colonoscopy 04/17/2018 negative for active bleeding. No polyps were seen.Diverticulosis and  hemorrhoids were seen.    Recommendations --------------------------- - Abdominal pain is nonspecific. Repeat CT with IV contrast showed no acute changes.Could be panniculitis from repeated Lovenox injection.  - advance diet as tolerated - continue PPI.  - GI will follow.     Kathi Der MD, FACP 04/30/2018, 1:20 PM  Contact #  [phone]

## 2018-04-30 NOTE — Progress Notes (Signed)
Progress Note  Patient Name: Russell Carter Date of Encounter: 04/30/2018  Primary Cardiologist: Charlton Haws, MD   Subjective   62 yo male, admitted with abdominal pain and bradycardia .  Also had a metabolic acidosis and worsening CKD.  Renal function and bradycardia have improved with time and IV hydration.  He complains of some chest pain this am  Troponin levels have been negative so far.  Coumadin has been held.  His INR is in acceptable range to have a cath.  He continues to have abdominal pain.  Surgery has evaluated his abdominal pain and has determined that there is not a surgical issue with his belly. HIDA scan was negative.  Heart catheterization yesterday revealed patent stents.  He continues to have abdominal pain.  He is scheduled for CT of the abdomen today.  No new cardiac issues.   Inpatient Medications    Scheduled Meds: . allopurinol  300 mg Oral Daily  . divalproex  250 mg Oral TID  . insulin aspart  0-15 Units Subcutaneous Q4H  . multivitamin with minerals  1 tablet Oral Daily  . oxyCODONE  7.5 mg Oral TID  . pantoprazole  40 mg Oral BID  . rosuvastatin  40 mg Oral QHS  . sodium chloride flush  3 mL Intravenous Q12H   Continuous Infusions: . sodium chloride    . sodium chloride 250 mL (04/29/18 2045)  . heparin 1,300 Units/hr (04/29/18 2045)   PRN Meds: sodium chloride, sodium chloride, diphenhydrAMINE, hydrALAZINE, morphine injection, nitroGLYCERIN, sodium chloride flush   Vital Signs    Vitals:   04/29/18 1942 04/30/18 0012 04/30/18 0312 04/30/18 0438  BP: (!) 161/97 (!) 163/96 (!) 164/91 (!) 157/93  Pulse: 70 67 66 75  Resp: 18 18 18 16   Temp: 97.7 F (36.5 C) 98 F (36.7 C) 98 F (36.7 C)   TempSrc: Oral Oral Oral   SpO2: 97% 98% 98% 98%  Weight:   207 lb 4.8 oz (94 kg)   Height:        Intake/Output Summary (Last 24 hours) at 04/30/2018 0900 Last data filed at 04/30/2018 0730 Gross per 24 hour  Intake 1879.75 ml  Output 2170  ml  Net -290.25 ml   Filed Weights   04/29/18 0500 04/29/18 1224 04/30/18 0312  Weight: 208 lb 12.4 oz (94.7 kg) 208 lb 12.4 oz (94.7 kg) 207 lb 4.8 oz (94 kg)    Telemetry    Normal sinus rhythm.  He has occasional episodes of Wenke Bach heart block.- Personally Reviewed  ECG     NSR at 86.   NS ST abn in the lateral leads.  - Personally Reviewed  Physical Exam    Physical Exam: Blood pressure (!) 157/93, pulse 75, temperature 98 F (36.7 C), temperature source Oral, resp. rate 16, height 5\' 11"  (1.803 m), weight 207 lb 4.8 oz (94 kg), SpO2 98 %.  GEN:  Well nourished, well developed in no acute distress HEENT: Normal NECK: No JVD; No carotid bruits LYMPHATICS: No lymphadenopathy CARDIAC: RR, no murmurs, rubs, gallops RESPIRATORY:  Clear to auscultation without rales, wheezing or rhonchi  ABDOMEN:  Mildly tender  MUSCULOSKELETAL:  No edema; No deformity  SKIN: Warm and dry NEUROLOGIC:  Alert and oriented x 3   Labs    Chemistry Recent Labs  Lab 04/28/18 0224 04/28/18 0832 04/29/18 0239 04/30/18 0245  NA 142  --  141 139  K 4.0  --  3.9 3.9  CL 113*  --  109 104  CO2 25  --  26 26  GLUCOSE 122*  --  93 104*  BUN 14  --  6* 5*  CREATININE 1.41*  --  1.13 1.20  CALCIUM 8.0*  --  8.7* 9.1  PROT  --  5.5* 5.5* 6.5  ALBUMIN  --  3.0* 2.9* 3.2*  AST  --  22 17 26   ALT  --  20 19 23   ALKPHOS  --  40 38 44  BILITOT  --  0.5 0.7 0.5  GFRNONAA 52*  --  >60 >60  GFRAA 60*  --  >60 >60  ANIONGAP 4*  --  6 9     Hematology Recent Labs  Lab 04/28/18 0224 04/29/18 0239 04/30/18 0245  WBC 4.5 4.0 5.4  RBC 4.22 4.10* 4.74  HGB 12.5* 12.2* 14.1  HCT 39.6 38.4* 44.1  MCV 93.8 93.7 93.0  MCH 29.6 29.8 29.7  MCHC 31.6 31.8 32.0  RDW 14.4 14.1 13.6  PLT 179 175 196    Cardiac Enzymes Recent Labs  Lab 04/28/18 0224 04/28/18 0832 04/28/18 1406 04/28/18 2016  TROPONINI <0.03 <0.03 <0.03 <0.03    Recent Labs  Lab 04/26/18 2343  TROPIPOC 0.01      BNPNo results for input(s): BNP, PROBNP in the last 168 hours.   DDimer No results for input(s): DDIMER in the last 168 hours.   Radiology    Nm Hepato W/eject Fract  Result Date: 04/28/2018 CLINICAL DATA:  RIGHT upper quadrant pain question cholecystitis EXAM: NUCLEAR MEDICINE HEPATOBILIARY IMAGING WITH GALLBLADDER EF TECHNIQUE: Sequential images of the abdomen were obtained out to 60 minutes following intravenous administration of radiopharmaceutical. After oral ingestion of Ensure, gallbladder ejection fraction was determined. At 60 min, normal ejection fraction is greater than 33%. RADIOPHARMACEUTICALS:  5.0 mCi Tc-53m  Choletec IV COMPARISON:  None FINDINGS: Normal tracer extraction from bloodstream indicating normal hepatocellular function. Normal excretion of tracer into biliary tree. Gallbladder visualized at 10 min. Small bowel visualized at 52 min. No hepatic retention of tracer. Subjectively normal emptying of tracer from gallbladder following fatty meal stimulation. Calculated gallbladder ejection fraction is 46%, normal. Patient reported no symptoms following Ensure ingestion. Normal gallbladder ejection fraction following Ensure ingestion is greater than 33% at 1 hour. IMPRESSION: Patent biliary tree. Normal gallbladder response to fatty meal stimulation with a normal gallbladder ejection fraction of 46%. Electronically Signed   By: Ulyses Southward M.D.   On: 04/28/2018 19:10   US Abdomen Limited Ruq  Result Date: 04/28/2018 CLINICAL DATA:  RIGHT upper quadrant pain. EXAM: ULTRASOUND ABDOMEN LIMITED RIGHT UPPER QUADRANT COMPARISON:  Hepatobiliary scan April 28, 2018 and CT abdomen and pelvis April 27, 2018. FINDINGS: Gallbladder: No gallstones or wall thickening visualized. No sonographic Murphy sign noted by sonographer. Common bile duct: Diameter: 3 mm. Liver: No focal lesion identified. Within normal limits in parenchymal echogenicity. Portal vein is patent on color Doppler imaging with normal  direction of blood flow towards the liver. IMPRESSION: Negative RIGHT upper quadrant ultrasound. Electronically Signed   By: Awilda Metro M.D.   On: 04/28/2018 21:47    Cardiac Studies     Patient Profile     62 y.o. male with history of coronary artery disease.  He was admitted with abdominal pain, bradycardia and worsening renal function. Now describes some chest pain this morning that was relieved with 3 sublingual nitroglycerin.  Assessment & Plan    1.  Chest discomfort:   Heart catheter station yesterday revealed patent  stents and no significant coronary artery disease. No new recommendations.  2.  Intermittent Mobitz type I heart block: Continues to have Wenchebach heart block Asymptomatic. No further work up needed at this time   [employer] will sign off.   Medication Recommendations:  Continue work up for abd. Pain  Other recommendations (labs, testing, etc):   Follow up as an outpatient:  Follow up with Dr. Eden Emms in the office at his next scheduled follow up appt.       For questions or updates, please contact [facility] Please consult [email or web address] for contact info under Cardiology/STEMI.      Signed, Kristeen Miss, MD  04/30/2018, 9:00 AM

## 2018-04-30 NOTE — Progress Notes (Signed)
MD at bedside discussing plan of care for today. Patient will have CT Scan today. Patient shared that he is having pain in his back with urination since last night.

## 2018-04-30 NOTE — Progress Notes (Signed)
pts CT abd negative, urine results negative - complains of headache. Notified MD. Awaiting orders.

## 2018-05-01 LAB — HEPARIN LEVEL (UNFRACTIONATED): Heparin Unfractionated: 0.36 IU/mL (ref 0.30–0.70)

## 2018-05-01 LAB — COMPREHENSIVE METABOLIC PANEL
ALK PHOS: 39 U/L (ref 38–126)
ALT: 27 U/L (ref 0–44)
AST: 26 U/L (ref 15–41)
Albumin: 3.4 g/dL — ABNORMAL LOW (ref 3.5–5.0)
Anion gap: 12 (ref 5–15)
BILIRUBIN TOTAL: 0.6 mg/dL (ref 0.3–1.2)
BUN: 8 mg/dL (ref 8–23)
CALCIUM: 9 mg/dL (ref 8.9–10.3)
CO2: 25 mmol/L (ref 22–32)
CREATININE: 1.35 mg/dL — AB (ref 0.61–1.24)
Chloride: 102 mmol/L (ref 98–111)
GFR, EST NON AFRICAN AMERICAN: 54 mL/min — AB (ref >60)
Glucose, Bld: 103 mg/dL — ABNORMAL HIGH (ref 70–99)
Potassium: 3.8 mmol/L (ref 3.5–5.1)
Sodium: 139 mmol/L (ref 135–145)
TOTAL PROTEIN: 6.4 g/dL — AB (ref 6.5–8.1)

## 2018-05-01 LAB — CBC
HEMATOCRIT: 43.2 % (ref 39.0–52.0)
Hemoglobin: 13.5 g/dL (ref 13.0–17.0)
MCH: 29.3 pg (ref 26.0–34.0)
MCHC: 31.3 g/dL (ref 30.0–36.0)
MCV: 93.9 fL (ref 78.0–100.0)
Platelets: 200 10*3/uL (ref 150–400)
RBC: 4.6 MIL/uL (ref 4.22–5.81)
RDW: 13.7 % (ref 11.5–15.5)
WBC: 6.1 10*3/uL (ref 4.0–10.5)

## 2018-05-01 LAB — PROTIME-INR
INR: 1.2
PROTHROMBIN TIME: 15.1 s (ref 11.4–15.2)

## 2018-05-01 LAB — GLUCOSE, CAPILLARY: Glucose-Capillary: 103 mg/dL — ABNORMAL HIGH (ref 70–99)

## 2018-05-01 MED ORDER — ENOXAPARIN SODIUM 100 MG/ML ~~LOC~~ SOLN: 1.0000 mg/kg | Freq: Two times a day (BID) | SUBCUTANEOUS | Status: DC

## 2018-05-01 MED ORDER — ENOXAPARIN SODIUM 100 MG/ML ~~LOC~~ SOLN: 100.0000 mg | Freq: Two times a day (BID) | SUBCUTANEOUS | 0 refills | Status: DC

## 2018-05-01 MED ORDER — POLYETHYLENE GLYCOL 3350 17 G PO PACK
17.0000 g | PACK | Freq: Every day | ORAL | Status: DC
  Administered 2018-05-01: 17 g via ORAL
  Filled 2018-05-01: qty 1

## 2018-05-01 MED ORDER — INSULIN ASPART 100 UNIT/ML ~~LOC~~ SOLN: 0.0000 [IU] | Freq: Three times a day (TID) | SUBCUTANEOUS | Status: DC

## 2018-05-01 MED ORDER — ENOXAPARIN SODIUM 100 MG/ML ~~LOC~~ SOLN
100.0000 mg | Freq: Two times a day (BID) | SUBCUTANEOUS | Status: DC
  Administered 2018-05-01: 100 mg via SUBCUTANEOUS
  Filled 2018-05-01: qty 1

## 2018-05-01 MED ORDER — WARFARIN SODIUM 5 MG PO TABS: 5.0000 mg | ORAL_TABLET | Freq: Once | ORAL | Status: DC

## 2018-05-01 NOTE — Discharge Instructions (Signed)
Follow with Marcine Matar, MD in 5-7 days Continue to take your Lovenox and Coumadin. Have you INR checked in 3-4 days and talk to your PCP when to stop Lovenox.  Please get a complete blood count and chemistry panel checked by your Primary MD at your next visit, and again as instructed by your Primary MD. Please get your medications reviewed and adjusted by your Primary MD.  Please request your Primary MD to go over all Hospital Tests and Procedure/Radiological results at the follow up, please get all Hospital records sent to your Prim MD by signing hospital release before you go home.  If you had Pneumonia of Lung problems at the Hospital: Please get a 2 view Chest X ray done in 6-8 weeks after hospital discharge or sooner if instructed by your Primary MD.  If you have Congestive Heart Failure: Please call your Cardiologist or Primary MD anytime you have any of the following symptoms:  1) 3 pound weight gain in 24 hours or 5 pounds in 1 week  2) shortness of breath, with or without a dry hacking cough  3) swelling in the hands, feet or stomach  4) if you have to sleep on extra pillows at night in order to breathe  Follow cardiac low salt diet and 1.5 lit/day fluid restriction.  If you have diabetes Accuchecks 4 times/day, Once in AM empty stomach and then before each meal. Log in all results and show them to your primary doctor at your next visit. If any glucose reading is under 80 or above 300 call your primary MD immediately.  If you have Seizure/Convulsions/Epilepsy: Please do not drive, operate heavy machinery, participate in activities at heights or participate in high speed sports until you have seen by Primary MD or a Neurologist and advised to do so again.  If you had Gastrointestinal Bleeding: Please ask your Primary MD to check a complete blood count within one week of discharge or at your next visit. Your endoscopic/colonoscopic biopsies that are pending at the time of  discharge, will also need to followed by your Primary MD.  Get Medicines reviewed and adjusted. Please take all your medications with you for your next visit with your Primary MD  Please request your Primary MD to go over all hospital tests and procedure/radiological results at the follow up, please ask your Primary MD to get all Hospital records sent to his/her office.  If you experience worsening of your admission symptoms, develop shortness of breath, life threatening emergency, suicidal or homicidal thoughts you must seek medical attention immediately by calling 911 or calling your MD immediately  if symptoms less severe.  You must read complete instructions/literature along with all the possible adverse reactions/side effects for all the Medicines you take and that have been prescribed to you. Take any new Medicines after you have completely understood and accpet all the possible adverse reactions/side effects.   Do not drive or operate heavy machinery when taking Pain medications.   Do not take more than prescribed Pain, Sleep and Anxiety Medications  Special Instructions: If you have smoked or chewed Tobacco  in the last 2 yrs please stop smoking, stop any regular Alcohol  and or any Recreational drug use.  Wear Seat belts while driving.  Please note You were cared for by a hospitalist during your hospital stay. If you have any questions about your discharge medications or the care you received while you were in the hospital after you are discharged, you can  call the unit and asked to speak with the hospitalist on call if the hospitalist that took care of you is not available. Once you are discharged, your primary care physician will handle any further medical issues. Please note that NO REFILLS for any discharge medications will be authorized once you are discharged, as it is imperative that you return to your primary care physician (or establish a relationship with a primary care  physician if you do not have one) for your aftercare needs so that they can reassess your need for medications and monitor your lab values.  You can reach the hospitalist office at phone [phone] or fax [phone]   If you do not have a primary care physician, you can call [phone] for a physician referral.  Activity: As tolerated with Full fall precautions use walker/cane & assistance as needed  Diet: heart healthy  Disposition Home

## 2018-05-01 NOTE — Progress Notes (Signed)
ANTICOAGULATION CONSULT NOTE - Follow-Up Consult  Pharmacy Consult for Warfarin>>>Heparin  Indication: pulmonary embolus/CVA/CP  Allergies  Allergen Reactions  . Shellfish Allergy Anaphylaxis   Patient Measurements: Height: 5\' 11"  (180.3 cm) Weight: 203 lb 11.2 oz (92.4 kg)(scale b) IBW/kg (Calculated) : 75.3  Vital Signs: Temp: 98.4 F (36.9 C) (07/11 0456) Temp Source: Oral (07/11 0456) BP: 159/93 (07/11 0700) Pulse Rate: 86 (07/11 0700)  Labs: Recent Labs    04/28/18 1406 04/28/18 2016  04/29/18 0239 04/30/18 0245 04/30/18 0957 05/01/18 0603  HGB  --   --    < > 12.2* 14.1  --  13.5  HCT  --   --   --  38.4* 44.1  --  43.2  PLT  --   --   --  175 196  --  200  LABPROT  --   --   --  19.7* 17.1*  --  15.1  INR  --   --   --  1.68 1.40  --  1.20  HEPARINUNFRC  --   --   --   --  0.38 0.32 0.36  CREATININE  --   --   --  1.13 1.20  --  1.35*  TROPONINI <0.03 <0.03  --   --   --   --   --    < > = values in this interval not displayed.    Estimated Creatinine Clearance: 65 mL/min (A) (by C-G formula based on SCr of 1.35 mg/dL (H)).   Medical History: Past Medical History:  Diagnosis Date  . Anginal pain (HCC)   . Anxiety   . Arthritis   . Bipolar disorder (HCC)   . Blood in stool   . CAD (coronary artery disease)    a. 2011: cath showing mod non-obstructive disease b. 02/2016: cath with 95% stenosis in the Mid RCA. DES placed.  . Cervical stenosis of spine   . Cocaine abuse (HCC)    history  . Depression   . Diabetes mellitus without complication (HCC)   . Gout, unspecified   . Headache   . HTN (hypertension)   . Hypercholesterolemia   . Hyperglycemia   . Migraines   . Myocardial infarction [facility]) 2008   drug cocaine and marijuana at time of mi none  since    . Pneumonia   . Rectal bleeding   . Renal insufficiency   . Seizures (HCC)   . Stroke [facility])    Assessment: 62 yoM admitted with bradycardia. Pt on warfarin PTA for hx PE and CVA. Warfarin  resumed 7/7, then held for procedure. Per cards, likely no indication for PPM but now s/p cath 7/9. Pharmacy consulted to dose heparin for hx PE post-cath and resume warfarin 7/10. Heparin level therapeutic. INR now down to 1.2 (due to held doses). CBC stable. No bleed documented.  PTA warfarin dose: 2.5mg  except 5mg  on MWF  Goal of Therapy:  Heparin level 0.3-0.7 units/mL INR 2-3 Monitor platelets by anticoagulation protocol: Yes   Plan:  Continue heparin drip at 1300 units/hr Warfarin 5mg  PO x 1 Monitor daily heparin level/INR/CBC, s/sx bleeding  Babs Bertin, PharmD, BCPS Clinical Pharmacist Clinical phone [phone] Please check AMION for all [facility] contact numbers 05/01/2018 8:42 AM

## 2018-05-01 NOTE — Discharge Summary (Signed)
Physician Discharge Summary  Russell Carter ZOX:[id number] DOB: Nov 19, 1955 DOA: 04/26/2018  PCP: Marcine Matar, MD  Admit date: 04/26/2018 Discharge date: 05/01/2018  Admitted From: home Disposition:  home  Recommendations for Outpatient Follow-up:  1. Follow up with PCP in 1-2 weeks  Home Health: none Equipment/Devices: none  Discharge Condition: stable CODE STATUS: Full code Diet recommendation: heart healthy  HPI: Per Dr. Ardeth Perfect, This 62 y.o. African-American male reformed smoker presented to the [facility] Emergency Department via [facility] with complaints of loss of consciousness.  The patient's fiance observe the patient lose consciousness and called EMS.  According to ER documentation, the patient was found unresponsive by EMS.  Apparently required external pacing in the field; however on arrival to the emergency department, he was no longer on external pacing support, hypotensive and on nonrebreather mask.  He was responsive only to painful stimuli but improved to be responsive to verbal stimuli.  In the emergency department, the patient was observed to be in complete heart block. The patient endorses that he did feel nausea and abdominal pain.  He denies any bouts of emesis.  No chest pain.  No dyspnea, orthopnea or paroxysmal nocturnal dyspnea.  The patient has a history of a postoperative DVT (RLE 01/2017, LLE 03/2017) and pulmonary embolism (01/2017) and is on lifelong secondary prophylaxis with Coumadin.   Hospital Course: Right upper quadrant abdominal pain -Unclear etiology, noncontrasted CT in the ER was unremarkable. Repeat CT scan with contrast unremarkable as well. General surgery and gastroenterology were consulted and followed patient while hospitalized. Patient had a normal UA, normal HIDA scan. Imaging did show patchy areas of infiltration and emphysema in the subcutaneous fat of the anterior abdominal wall, likely representing subcutaneous  injection sites. This was felt to be the cause.  His abdominal pain eventually improved, he is able to tolerate a regular diet, general surgery and gastroenterology signed off and will be discharge patient home in stable condition Coronary artery disease with prior RCA stent-Underwent cardiac catheterization on 7/9, the mid RCA stent is patent, no other significant obstructions Bradycardia -Questionable complete heart block/second-degree AV block, cardiology following, stable now, seems like it is more Wenckebach.  No further work-up per cardiology. Lethargy  -Likely due to bradycardia which in turn likely was caused by vagal response to abdominal pain, currently stable, TSH stable. H/O PE -continue Coumadin /Lovenox bridge Dyslipidemia  -Continue statin. History of cocaine abuse -Says does not do it anymore. Tox screen negative. DM type II -resume home medications  Discharge Diagnoses:  Principal Problem:   Abdominal pain Active Problems:   CAD-50% LAD 2011   Complete heart block (HCC)   Syncope   Acute kidney injury (HCC)   Lactic acidosis   Chronic anticoagulation  Discharge Instructions  Allergies as of 05/01/2018      Reactions   Shellfish Allergy Anaphylaxis      Medication List    STOP taking these medications   ALPRAZolam 0.5 MG tablet Commonly known as:  XANAX     TAKE these medications   acetaminophen 500 MG tablet Commonly known as:  TYLENOL Take 1,000 mg by mouth every 6 (six) hours as needed for headache (pain).   allopurinol 300 MG tablet Commonly known as:  ZYLOPRIM Take 1 tablet (300 mg total) by mouth daily.   CALCIUM-VITAMIN D PO Take 1 tablet by mouth daily.   clotrimazole 1 % cream Commonly known as:  LOTRIMIN Apply 1 application topically 2 (two) times  daily.   divalproex 250 MG DR tablet Commonly known as:  DEPAKOTE Take 250 mg by mouth 3 (three) times daily.   DULoxetine 30 MG capsule Commonly known as:  CYMBALTA Take 1 capsule (30 mg  total) by mouth 2 (two) times daily.   enoxaparin 100 MG/ML injection Commonly known as:  LOVENOX Inject 1 mL (100 mg total) into the skin every 12 (twelve) hours.   gabapentin 600 MG tablet Commonly known as:  NEURONTIN Take 600 mg by mouth 2 (two) times daily.   gabapentin 400 MG capsule Commonly known as:  NEURONTIN Take 1 capsule (400 mg total) 2 (two) times daily by mouth.   hydrOXYzine 25 MG tablet Commonly known as:  ATARAX/VISTARIL TAKE 1 TABLET BY MOUTH EVERY 6 HOURS AS NEEDED FOR ANXIETY   ibuprofen 600 MG tablet Commonly known as:  ADVIL,MOTRIN Take 600 mg by mouth 2 (two) times daily as needed (pain). Take with food What changed:  Another medication with the same name was removed. Continue taking this medication, and follow the directions you see here.   losartan 100 MG tablet Commonly known as:  COZAAR TAKE 1 TABLET BY MOUTH DAILY.   meclizine 25 MG tablet Commonly known as:  ANTIVERT 1 tab PO daily x 1 week then daily PRN What changed:    how much to take  how to take this  when to take this  reasons to take this  additional instructions   methocarbamol 750 MG tablet Commonly known as:  ROBAXIN TAKE 1 TABLET BY MOUTH EVERY 8 HOURS AS NEEDED FOR MUSCLE SPASMS.   metoCLOPramide 10 MG tablet Commonly known as:  REGLAN TAKE 1 TABLET BY MOUTH EVERY 6 HOURS AS NEEDED FOR NAUSEA OR HEADACHE.   multivitamin with minerals Tabs tablet Take 1 tablet by mouth daily.   nitroGLYCERIN 0.4 MG SL tablet Commonly known as:  NITROSTAT Place 1 tablet (0.4 mg total) under the tongue every 5 (five) minutes as needed for chest pain. What changed:  Another medication with the same name was removed. Continue taking this medication, and follow the directions you see here.   omeprazole 20 MG capsule Commonly known as:  PRILOSEC Take 20 mg by mouth daily.   oxyCODONE-acetaminophen 7.5-325 MG tablet Commonly known as:  PERCOCET Take 1 tablet by mouth every 4 (four)  hours as needed for severe pain.   pantoprazole 40 MG tablet Commonly known as:  PROTONIX Take 1 tablet (40 mg total) by mouth at bedtime.   rosuvastatin 40 MG tablet Commonly known as:  CRESTOR Take 40 mg by mouth daily.   SYSTANE OP Place 1 drop into both eyes 2 (two) times daily.   traZODone 100 MG tablet Commonly known as:  DESYREL Take 100 mg by mouth at bedtime as needed for sleep.   warfarin 5 MG tablet Commonly known as:  COUMADIN Take as directed. If you are unsure how to take this medication, talk to your nurse or doctor. Original instructions:  Take 1 tablet (5 mg total) by mouth daily. What changed:    how much to take  when to take this  additional instructions      Follow-up Information    Graylin Shiver, MD. Schedule an appointment as soon as possible for a visit in 4 weeks.   Specialty:  Gastroenterology Why:  Follow-up for Abdominal pain.Office will call patient Contact information: [address] [place] [address] [phone]        Jonah Blue  B, MD. Schedule an appointment as soon as possible for a visit in 1 week(s).   Specialty:  Internal Medicine Contact information: [address] [place] [address] [phone]        Nishan, Peter C, MD.   Specialty:  Cardiology Contact information: [address] [place] [address] [phone]           Consultations:  Cardiology  Gastroenterology  General surgery  Procedures/Studies:  Cardiac cath  Previously placed Mid RCA drug eluting stent is widely patent.  LV end diastolic pressure is mildly elevated.  2D echo  Impressions: - Normal LV systolic function; mild LVH; moderate diastolic dysfunction.  Ct Abdomen Pelvis Wo Contrast  Result Date: 04/27/2018 CLINICAL DATA:  Acute generalized abdominal pain. EXAM: CT ABDOMEN AND PELVIS WITHOUT CONTRAST TECHNIQUE: Multidetector CT imaging of the abdomen and pelvis was performed  following the standard protocol without IV contrast. COMPARISON:  03/13/2017 FINDINGS: Lower chest: Atelectasis in the lung bases. Coronary artery calcifications. Hepatobiliary: No focal liver abnormality is seen. No gallstones, gallbladder wall thickening, or biliary dilatation. Pancreas: Unremarkable. No pancreatic ductal dilatation or surrounding inflammatory changes. Spleen: Normal in size without focal abnormality. Adrenals/Urinary Tract: Adrenal glands are unremarkable. Kidneys are normal, without renal calculi, focal lesion, or hydronephrosis. Bladder is unremarkable. Stomach/Bowel: Stomach, small bowel, and colon are not abnormally distended. No wall thickening is appreciated although under distention limits evaluation. No inflammatory infiltration. Scattered diverticula in the colon. No evidence of diverticulitis. Appendix is normal. Vascular/Lymphatic: No significant vascular findings are present. No enlarged abdominal or pelvic lymph nodes. Reproductive: Prostate gland is not enlarged. Other: No free air or free fluid in the abdomen. Abdominal wall musculature appears intact. Postoperative changes in the left inguinal region suggesting previous hernia repair. There patchy areas of infiltration and emphysema in the subcutaneous fat of the anterior abdominal wall. These likely represent subcutaneous injection sites. Surface contusion or laceration could also have this appearance in the appropriate clinical setting. Musculoskeletal: Degenerative changes in the lumbar spine. No destructive bone lesions. IMPRESSION: 1. No evidence of bowel obstruction or inflammation. 2. Atelectasis in the lung bases. 3. Coronary artery calcifications. 4. Patchy areas of infiltration and emphysema in the subcutaneous fat of the anterior abdominal wall, likely representing subcutaneous injection sites. Electronically Signed   By: William  Stevens M.D.   On: 04/27/2018 01:30   Ct Head Wo Contrast  Result Date:  04/27/2018 CLINICAL DATA:  Altered level of consciousness. EXAM: CT HEAD WITHOUT CONTRAST TECHNIQUE: Contiguous axial images were obtained from the base of the skull through the vertex without intravenous contrast. COMPARISON:  CT head 07/31/2017.  MRI brain 05/15/2017. FINDINGS: Brain: No evidence of acute infarction, hemorrhage, hydrocephalus, extra-axial collection or mass lesion/mass effect. Mild diffuse cerebral atrophy. Vascular: No hyperdense vessel or unexpected calcification. Skull: Normal. Negative for fracture or focal lesion. Sinuses/Orbits: No acute finding. Other: None. IMPRESSION: No acute intracranial abnormalities.  Mild cerebral atrophy. Electronically Signed   By: William  Stevens M.D.   On: 04/27/2018 01:25   Ct Abdomen Pelvis W Contrast  Result Date: 04/30/2018 CLINICAL DATA:  Abdominal pain and bradycardia. Worsening chronic kidney disease with metabolic acidosis. EXAM: CT ABDOMEN AND PELVIS WITH CONTRAST TECHNIQUE: Multidetector CT imaging of the abdomen and pelvis was performed using the standard protocol following bolus administration of intravenous contrast. CONTRAST:  <MEASUREM<MEASUREMEN T> OMNIPAQUE IOHEXOL 300 MG/ML  SOLN COMPARISON:  Noncontrast CT 04/27/2018. FINDINGS: Lower chest: Mild atelectasis at both lung bases, similar to previous study. There  is trace pleural fluid on the left. There is no pericardial effusion. Coronary artery calcifications and possible aortic valvular calcifications are noted. Hepatobiliary: The liver is normal in density without focal abnormality. No evidence of gallstones, gallbladder wall thickening or biliary dilatation. Pancreas: Unremarkable. No pancreatic ductal dilatation or surrounding inflammatory changes. Spleen: Normal in size without focal abnormality. Adrenals/Urinary Tract: Both adrenal glands appear normal. There are small cysts in the lower pole of the right kidney. No evidence of renal mass, urinary tract calculus or hydronephrosis. No significant bladder  findings. Stomach/Bowel: No evidence of bowel wall thickening, distention or surrounding inflammatory change. The appendix appears normal. Vascular/Lymphatic: There are no enlarged abdominal or pelvic lymph nodes. Small inguinal lymph nodes are stable. Minimal iliac atherosclerosis without acute vascular findings. Reproductive: The prostate gland appears stable. Other: Interval mild left hemidiaphragm elevation. There are stable sequela of probable subcutaneous injections within the subcutaneous fat of the anterior abdominal wall and stable probable postsurgical changes in the left inguinal region. No recurrent hernia seen. Musculoskeletal: No acute or significant osseous findings. Prominent facet hypertrophy in the lower lumbar spine. IMPRESSION: 1. No acute findings or explanation for the patient's symptoms. 2. Interval increased left hemidiaphragm elevation with similar atelectasis at both lung bases. 3. Stable findings in the subcutaneous fat of the anterior abdominal wall, attributed to subcutaneous injections. 4. Coronary artery atherosclerosis. Electronically Signed   By: Carey Bullocks M.D.   On: 04/30/2018 11:32   Nm Hepato W/eject Fract  Result Date: 04/28/2018 CLINICAL DATA:  RIGHT upper quadrant pain question cholecystitis EXAM: NUCLEAR MEDICINE HEPATOBILIARY IMAGING WITH GALLBLADDER EF TECHNIQUE: Sequential images of the abdomen were obtained out to 60 minutes following intravenous administration of radiopharmaceutical. After oral ingestion of Ensure, gallbladder ejection fraction was determined. At 60 min, normal ejection fraction is greater than 33%. RADIOPHARMACEUTICALS:  5.0 mCi Tc-46m  Choletec IV COMPARISON:  None FINDINGS: Normal tracer extraction from bloodstream indicating normal hepatocellular function. Normal excretion of tracer into biliary tree. Gallbladder visualized at 10 min. Small bowel visualized at 52 min. No hepatic retention of tracer. Subjectively normal emptying of tracer from  gallbladder following fatty meal stimulation. Calculated gallbladder ejection fraction is 46%, normal. Patient reported no symptoms following Ensure ingestion. Normal gallbladder ejection fraction following Ensure ingestion is greater than 33% at 1 hour. IMPRESSION: Patent biliary tree. Normal gallbladder response to fatty meal stimulation with a normal gallbladder ejection fraction of 46%. Electronically Signed   By: Ulyses Southward M.D.   On: 04/28/2018 19:10   Dg Chest Portable 1 View  Result Date: 04/26/2018 CLINICAL DATA:  Unresponsive, abdominal and leg pain EXAM: PORTABLE CHEST 1 VIEW COMPARISON:  Portable exam 2331 hours compared to 03/12/2017 FINDINGS: External pacing leads project over chest. Normal heart size, mediastinal contours, and pulmonary vascularity. Low lung volumes with bibasilar atelectasis. No definite infiltrate, pleural effusion or pneumothorax. Cervical spine fusion. IMPRESSION: Low lung volumes with bibasilar atelectasis. Electronically Signed   By: Ulyses Southward M.D.   On: 04/26/2018 23:43   Dg Abd Portable 1v  Result Date: 04/28/2018 CLINICAL DATA:  62 year old male with a history of abdominal pain EXAM: PORTABLE ABDOMEN - 1 VIEW COMPARISON:  04/27/2018 FINDINGS: Interval removal of the gastric tube. Interval removal of defibrillator pads. Gas within stomach, small bowel, colon. No abnormal distention. Gas extends to the rectum. No unexpected radiopaque foreign body. No unexpected soft tissue density. No unexpected calcification. No acute displaced fracture IMPRESSION: Nonobstructive bowel gas pattern. Electronically Signed   By: Gilmer Mor  D.O.   On: 04/28/2018 08:43   Dg Abd Portable 1 View  Result Date: 04/27/2018 CLINICAL DATA:  NG tube placement EXAM: PORTABLE ABDOMEN - 1 VIEW COMPARISON:  04/26/2018 FINDINGS: Elevation of the left diaphragm with atelectasis at the left base. Esophageal tube tip overlies the gastric body. Mild diffuse increased bowel gas. IMPRESSION:  Esophageal tube tip overlies the gastric body Electronically Signed   By: Jasmine Pang M.D.   On: 04/27/2018 03:15   Dg Abd Portable 1 View  Result Date: 04/26/2018 CLINICAL DATA:  Abdominal pain, unresponsive EXAM: PORTABLE ABDOMEN - 1 VIEW COMPARISON:  Portable exam 2331 hours compared to CT abdomen and pelvis of 03/13/2017 FINDINGS: Gaseous distention of stomach. Large and small bowel gas pattern normal. No bowel dilatation or bowel wall thickening. Degenerative changes at lower lumbar spine. IMPRESSION: Gaseous distention of stomach. Electronically Signed   By: Ulyses Southward M.D.   On: 04/26/2018 23:44   US Abdomen Limited Ruq  Result Date: 04/28/2018 CLINICAL DATA:  RIGHT upper quadrant pain. EXAM: ULTRASOUND ABDOMEN LIMITED RIGHT UPPER QUADRANT COMPARISON:  Hepatobiliary scan April 28, 2018 and CT abdomen and pelvis April 27, 2018. FINDINGS: Gallbladder: No gallstones or wall thickening visualized. No sonographic Murphy sign noted by sonographer. Common bile duct: Diameter: 3 mm. Liver: No focal lesion identified. Within normal limits in parenchymal echogenicity. Portal vein is patent on color Doppler imaging with normal direction of blood flow towards the liver. IMPRESSION: Negative RIGHT upper quadrant ultrasound. Electronically Signed   By: Awilda Metro M.D.   On: 04/28/2018 21:47     Subjective: - no chest pain, shortness of breath, no abdominal pain, nausea or vomiting.   Discharge Exam: Vitals:   05/01/18 0700 05/01/18 1006  BP: (!) 159/93 (!) 156/87  Pulse: 86 77  Resp:    Temp:    SpO2:      General: Pt is alert, awake, not in acute distress Cardiovascular: RRR, S1/S2 +, no rubs, no gallops Respiratory: CTA bilaterally, no wheezing, no rhonchi Abdominal: Soft, NT, ND, bowel sounds + Extremities: no edema, no cyanosis    The results of significant diagnostics from this hospitalization (including imaging, microbiology, ancillary and laboratory) are listed below for  reference.     Microbiology: Recent Results (from the past 240 hour(s))  MRSA PCR Screening     Status: None   Collection Time: 04/27/18  6:18 AM  Result Value Ref Range Status   MRSA by PCR NEGATIVE NEGATIVE Final    Comment:        The GeneXpert MRSA Assay (FDA approved for NASAL specimens only), is one component of a comprehensive MRSA colonization surveillance program. It is not intended to diagnose MRSA infection nor to guide or monitor treatment for MRSA infections. Performed at [facility], [address], [place], [place] [address]      Labs: BNP (last 3 results) No results for input(s): BNP in the last 8760 hours. Basic Metabolic Panel: Recent Labs  Lab 04/27/18 0316 04/27/18 6045 04/28/18 0224 04/29/18 0239 04/30/18 0245 05/01/18 0603  NA  --  142 142 141 139 139  K  --  4.0 4.0 3.9 3.9 3.8  CL  --  113* 113* 109 104 102  CO2  --  22 25 26 26 25   GLUCOSE  --  123* 122* 93 104* 103*  BUN  --  29* 14 6* 5* 8  CREATININE  --  2.03* 1.41* 1.13 1.20 1.35*  CALCIUM  --  7.7* 8.0* 8.7* 9.1 9.0  MG 2.0 2.2 2.0  --   --   --   PHOS  --  4.3 2.8  --   --   --    Liver Function Tests: Recent Labs  Lab 04/27/18 0632 04/28/18 0832 04/29/18 0239 04/30/18 0245 05/01/18 0603  AST 22 22 17 26 26   ALT 22 20 19 23 27   ALKPHOS 42 40 38 44 39  BILITOT 0.5 0.5 0.7 0.5 0.6  PROT 5.8* 5.5* 5.5* 6.5 6.4*  ALBUMIN 3.2* 3.0* 2.9* 3.2* 3.4*   Recent Labs  Lab 04/28/18 0832  LIPASE 26   No results for input(s): AMMONIA in the last 168 hours. CBC: Recent Labs  Lab 04/26/18 2326  04/27/18 1610 04/28/18 0224 04/29/18 0239 04/30/18 0245 05/01/18 0603  WBC 6.1  --  4.9 4.5 4.0 5.4 6.1  NEUTROABS 2.5  --   --   --   --   --   --   HGB 13.8   < > 13.1 12.5* 12.2* 14.1 13.5  HCT 43.7   < > 41.5 39.6 38.4* 44.1 43.2  MCV 94.4  --  93.7 93.8 93.7 93.0 93.9  PLT 197  --  178 179 175 196 200   < > = values in this interval not displayed.   Cardiac  Enzymes: Recent Labs  Lab 04/26/18 2326  04/27/18 1852 04/28/18 0224 04/28/18 0832 04/28/18 1406 04/28/18 2016  CKTOTAL 544*  --   --   --   --   --   --   TROPONINI <0.03   < > <0.03 <0.03 <0.03 <0.03 <0.03   < > = values in this interval not displayed.   BNP: Invalid input(s): POCBNP CBG: Recent Labs  Lab 04/30/18 0726 04/30/18 1126 04/30/18 1605 04/30/18 2112 05/01/18 0736  GLUCAP 122* 103* 89 108* 103*   D-Dimer No results for input(s): DDIMER in the last 72 hours. Hgb A1c No results for input(s): HGBA1C in the last 72 hours. Lipid Profile No results for input(s): CHOL, HDL, LDLCALC, TRIG, CHOLHDL, LDLDIRECT in the last 72 hours. Thyroid function studies No results for input(s): TSH, T4TOTAL, T3FREE, THYROIDAB in the last 72 hours.  Invalid input(s): FREET3 Anemia work up No results for input(s): VITAMINB12, FOLATE, FERRITIN, TIBC, IRON, RETICCTPCT in the last 72 hours. Urinalysis    Component Value Date/Time   COLORURINE STRAW (A) 04/30/2018 0838   APPEARANCEUR CLEAR 04/30/2018 0838   LABSPEC 1.004 (L) 04/30/2018 0838   PHURINE 8.0 04/30/2018 0838   GLUCOSEU NEGATIVE 04/30/2018 0838   HGBUR NEGATIVE 04/30/2018 0838   BILIRUBINUR NEGATIVE 04/30/2018 0838   KETONESUR NEGATIVE 04/30/2018 0838   PROTEINUR NEGATIVE 04/30/2018 0838   UROBILINOGEN 1.0 02/04/2015 0647   NITRITE NEGATIVE 04/30/2018 0838   LEUKOCYTESUR NEGATIVE 04/30/2018 0838   Sepsis Labs Invalid input(s): PROCALCITONIN,  WBC,  LACTICIDVEN   Time coordinating discharge: 35 minutes  SIGNED:  Pamella Pert, MD  [facility] 05/01/2018, 4:20 PM Pager [phone]  If 7PM-7AM, please contact night-coverage [email or web address] Password [email or web address]

## 2018-05-01 NOTE — Progress Notes (Signed)
[facility] Gastroenterology Progress Note  Russell Carter 62 y.o. [birth year]  CC:  Abdominal pain   Subjective: he is feeling better this morning. Abdominal pain improving. No bowel movement in last few days. Denies vomiting.   Objective: Vital signs in last 24 hours: Vitals:   05/01/18 0456 05/01/18 0700  BP: (!) 163/99 (!) 159/93  Pulse: 79 86  Resp: 18   Temp: 98.4 F (36.9 C)   SpO2: 96%     Physical Exam:   GEN - alert and oriented 3. Not in acute distress Abdomen. Mildly distended. Mild right upper quadrant tenderness to palpation (improved from yesterday). Bowel sounds present. No peritoneal signs  Lab Results: Recent Labs    04/30/18 0245 05/01/18 0603  NA 139 139  K 3.9 3.8  CL 104 102  CO2 26 25  GLUCOSE 104* 103*  BUN 5* 8  CREATININE 1.20 1.35*  CALCIUM 9.1 9.0   Recent Labs    04/30/18 0245 05/01/18 0603  AST 26 26  ALT 23 27  ALKPHOS 44 39  BILITOT 0.5 0.6  PROT 6.5 6.4*  ALBUMIN 3.2* 3.4*   Recent Labs    04/30/18 0245 05/01/18 0603  WBC 5.4 6.1  HGB 14.1 13.5  HCT 44.1 43.2  MCV 93.0 93.9  PLT 196 200   Recent Labs    04/30/18 0245 05/01/18 0603  LABPROT 17.1* 15.1  INR 1.40 1.20      Assessment/Plan: - right upper quadrant abdominal pain and previous Lovenox injection sites.normal CBC . Normal lipase.normal LFTs.ultrasound right upper quadrant negative.normal HIDA scan.CT abdomen pelvis without contrast 2 days ago on admission revealed Patchy areas of infiltration and emphysema in the subcutaneous fat of the anterior abdominal wall, likely representing subcutaneous injection sites. - heart block. Followed by cardiology. - Coronary artery disease. - PE. Coumadin on hold. Currently on heparin drip. - history of rectal bleeding. Colonoscopy 04/17/2018 negative for active bleeding. No polyps were seen.Diverticulosis and hemorrhoids were seen.    Recommendations --------------------------- - Abdominal pain is nonspecific.  Repeat CT with IV contrast showed no acute changes.Could be panniculitis from repeated Lovenox injection.  - advance diet as tolerated - start MiraLAX for constipation. - continue PPI. Change to once a day on discharge. - No further inpatient GI workup planned. Follow-up with Dr. Evette Cristal in 4 weeks after discharge, earlier if continues to have abdominal pain. - GI will sign off. Call us back if needed    Kathi Der MD, FACP 05/01/2018, 9:48 AM  Contact #  [phone]

## 2018-05-01 NOTE — Progress Notes (Signed)
Pt has orders to be discharged. Discharge instructions given and pt has no additional questions at this time. Medication regimen reviewed and pt educated. Pt verbalized understanding and has no additional questions. Telemetry box removed. IV removed and site in good condition. Pt stable and waiting for transportation. 

## 2018-05-04 IMAGING — CT CT HEAD W/O CM
3 of 4 series · 15 of 47 positions shown, 18 images · non-contrast
Comparison: 07/13/2017

CLINICAL DATA: Altered level of consciousness, headache, left-sided
weakness

EXAM:
CT HEAD WITHOUT CONTRAST
TECHNIQUE: Contiguous axial images were obtained from the base of the skull
through the vertex without intravenous contrast.

[Series 4: head 2.0 h70h · axial · 0.42mm/px · z∈[+203,+331]mm · 9 of 80 slices shown, 12 images]
[im 8/80  brain]
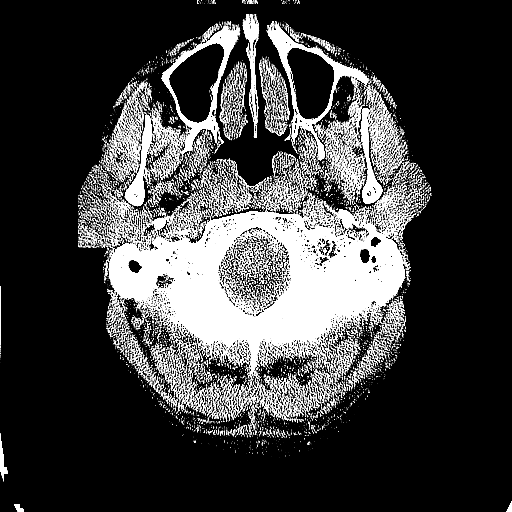
[im 8/80  bone]
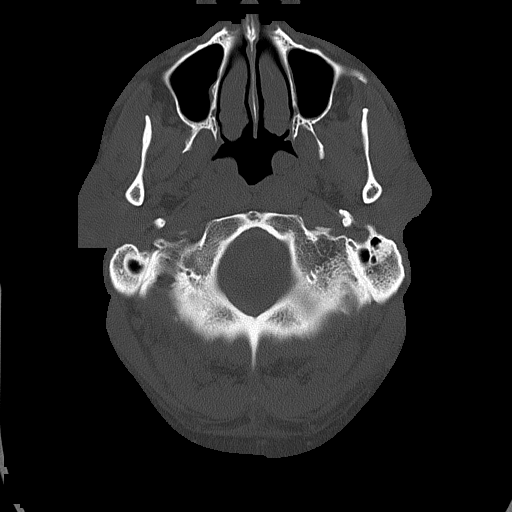
[im 16/80  brain]
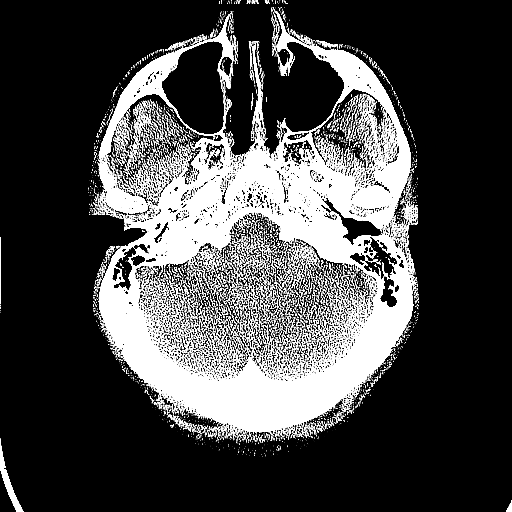
[im 24/80  brain]
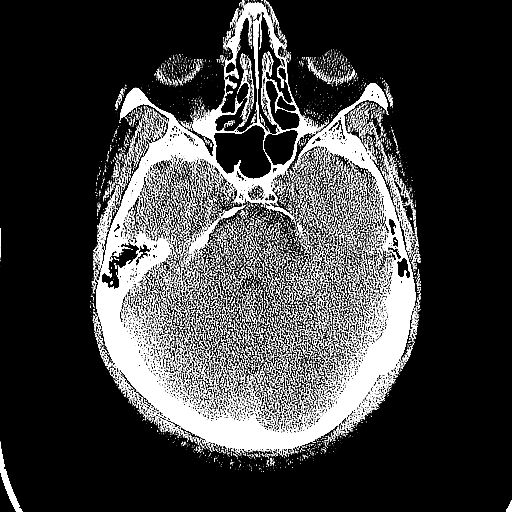
[im 32/80  brain]
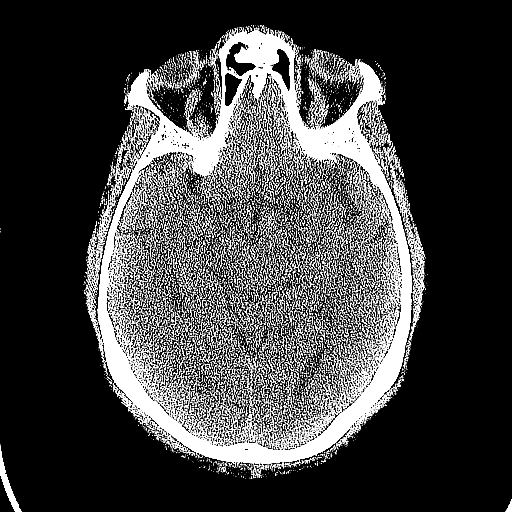
[im 40/80  brain]
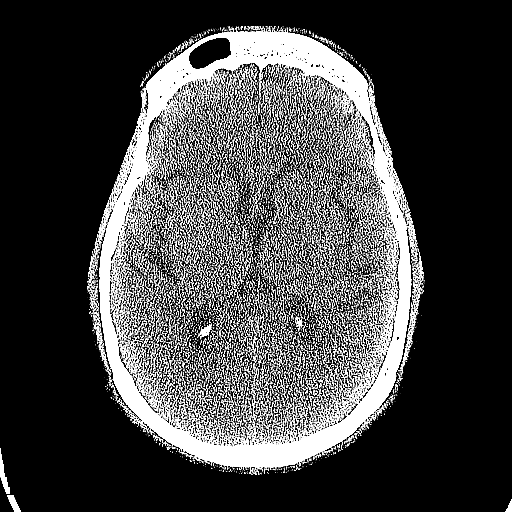
[im 40/80  bone]
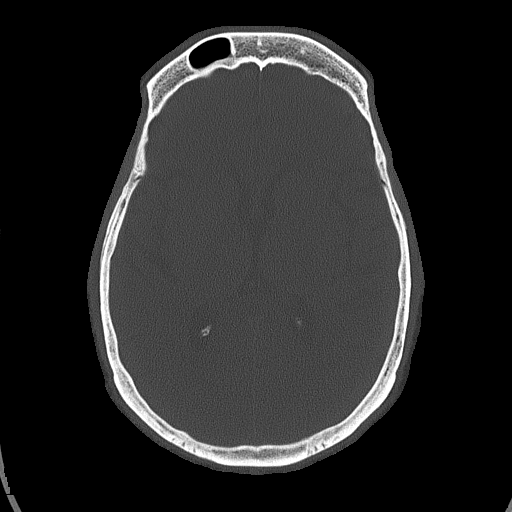
[im 48/80  brain]
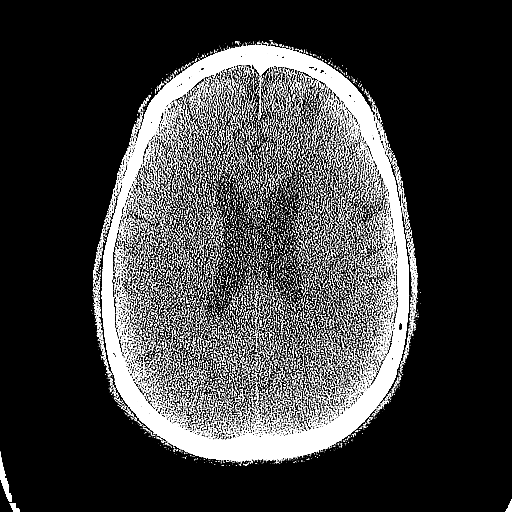
[im 56/80  brain]
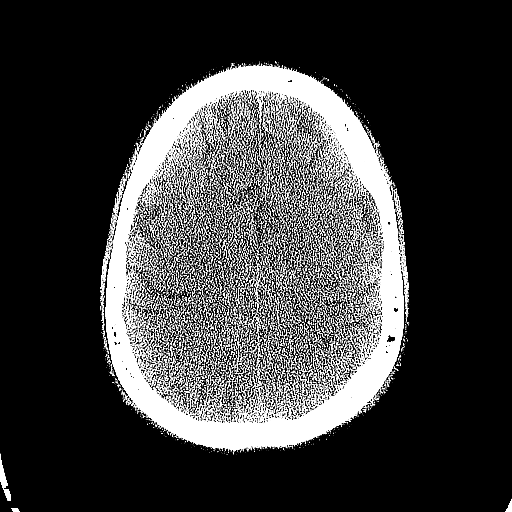
[im 64/80  brain]
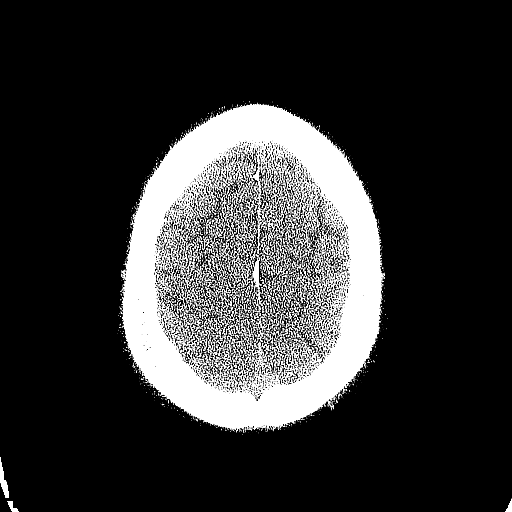
[im 72/80  brain]
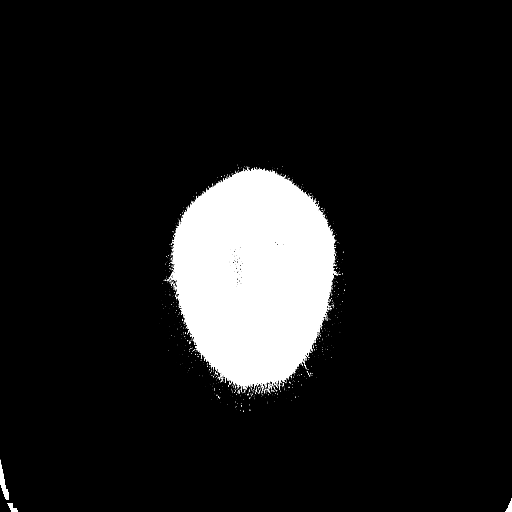
[im 72/80  bone]
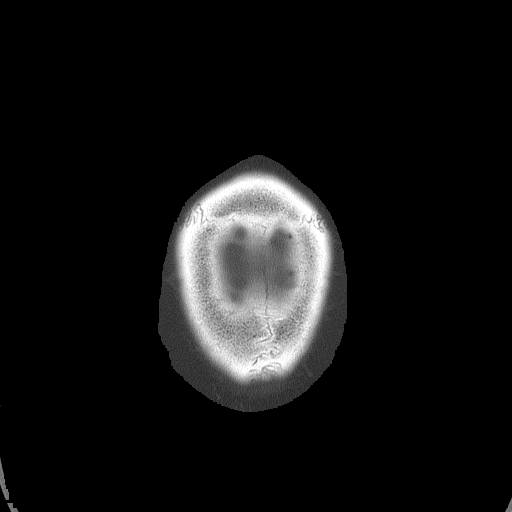

[Series 5: head 3.0 mpr cor · coronal · 0.31mm/px · 3 of 67 slices shown]
[im 23/67  brain]
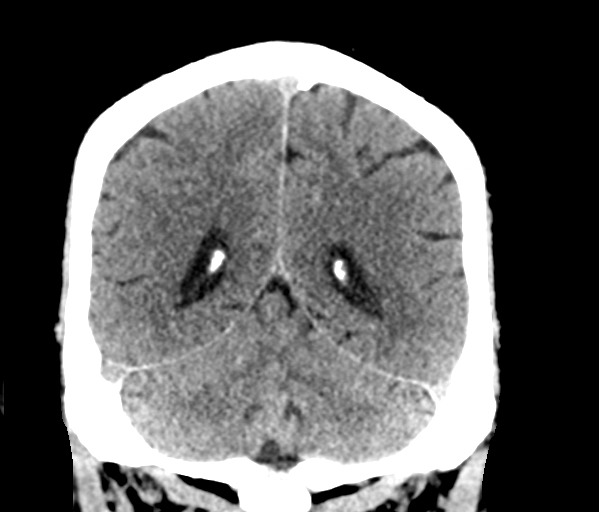
[im 30/67  brain]
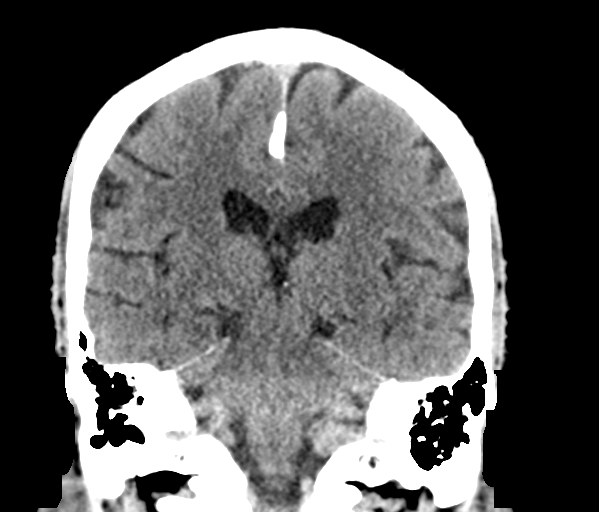
[im 37/67  brain]
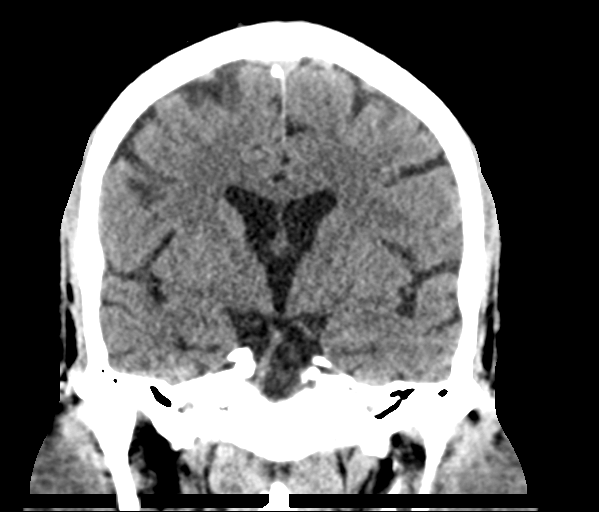

[Series 6: head 3.0 mpr sag · sagittal · 0.31mm/px · 3 of 55 slices shown]
[im 19/55  brain]
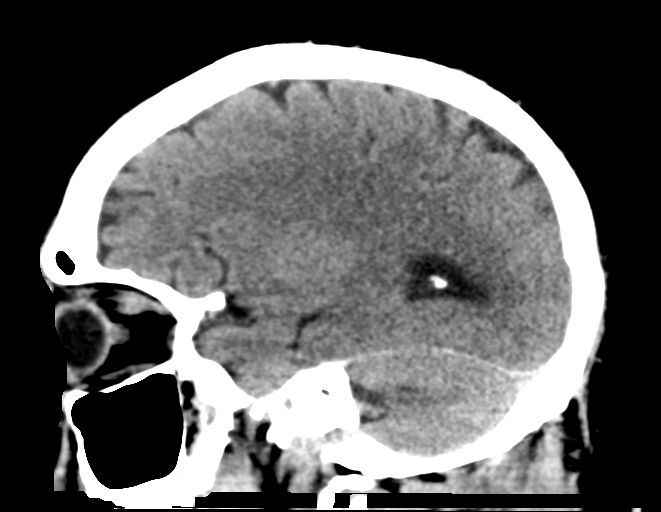
[im 28/55  brain]
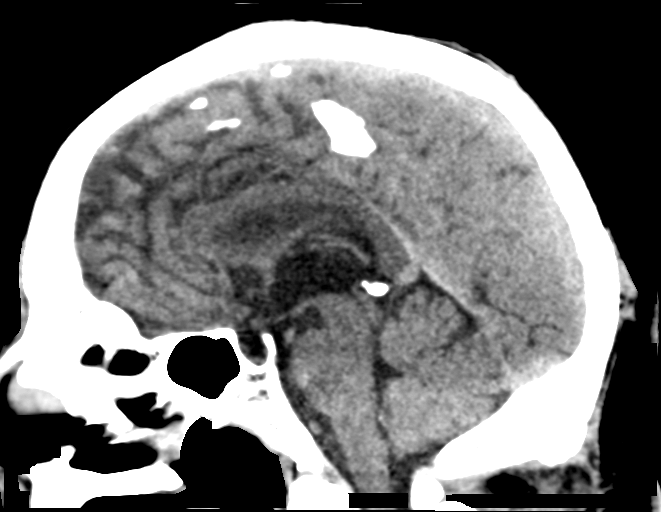
[im 37/55  brain]
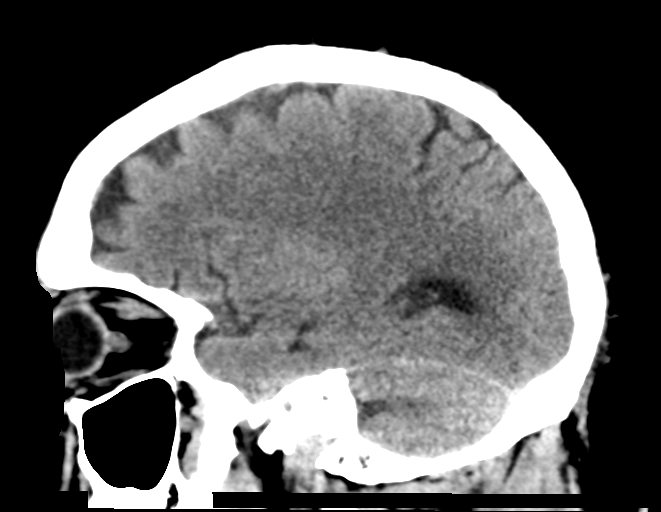

[15 of 47 positions shown; findings below may reference images not displayed]

FINDINGS: Brain: No acute intracranial abnormality. Specifically, no
hemorrhage, hydrocephalus, mass lesion, acute infarction, or
significant intracranial injury.

Vascular: No hyperdense vessel or unexpected calcification.

Skull: No acute calvarial abnormality.

Sinuses/Orbits: Visualized paranasal sinuses and mastoids clear.
Orbital soft tissues unremarkable.

Other: None
IMPRESSION: No acute intracranial abnormality.

## 2018-05-05 ENCOUNTER — Emergency Department (HOSPITAL_COMMUNITY): Payer: Medicaid Other

## 2018-05-05 ENCOUNTER — Inpatient Hospital Stay (HOSPITAL_COMMUNITY)
Admission: EM | Admit: 2018-05-05 | Discharge: 2018-05-07 | DRG: 100 | Disposition: A | Discharge status: Home / Self Care | Payer: Medicaid Other | Attending: Family Medicine | Admitting: Family Medicine

## 2018-05-05 ENCOUNTER — Encounter (HOSPITAL_COMMUNITY): Payer: Self-pay | Admitting: Emergency Medicine

## 2018-05-05 ENCOUNTER — Other Ambulatory Visit: Payer: Self-pay

## 2018-05-05 DIAGNOSIS — I219 Acute myocardial infarction, unspecified: Secondary | ICD-10-CM | POA: Diagnosis present

## 2018-05-05 DIAGNOSIS — N179 Acute kidney failure, unspecified: Secondary | ICD-10-CM | POA: Diagnosis present

## 2018-05-05 DIAGNOSIS — R569 Unspecified convulsions: Secondary | ICD-10-CM

## 2018-05-05 DIAGNOSIS — E119 Type 2 diabetes mellitus without complications: Secondary | ICD-10-CM

## 2018-05-05 DIAGNOSIS — Z8249 Family history of ischemic heart disease and other diseases of the circulatory system: Secondary | ICD-10-CM

## 2018-05-05 DIAGNOSIS — M542 Cervicalgia: Secondary | ICD-10-CM | POA: Diagnosis present

## 2018-05-05 DIAGNOSIS — Z955 Presence of coronary angioplasty implant and graft: Secondary | ICD-10-CM

## 2018-05-05 DIAGNOSIS — D631 Anemia in chronic kidney disease: Secondary | ICD-10-CM | POA: Diagnosis present

## 2018-05-05 DIAGNOSIS — I69354 Hemiplegia and hemiparesis following cerebral infarction affecting left non-dominant side: Secondary | ICD-10-CM

## 2018-05-05 DIAGNOSIS — I10 Essential (primary) hypertension: Secondary | ICD-10-CM | POA: Diagnosis present

## 2018-05-05 DIAGNOSIS — M546 Pain in thoracic spine: Secondary | ICD-10-CM | POA: Diagnosis present

## 2018-05-05 DIAGNOSIS — G894 Chronic pain syndrome: Secondary | ICD-10-CM | POA: Diagnosis present

## 2018-05-05 DIAGNOSIS — Z86711 Personal history of pulmonary embolism: Secondary | ICD-10-CM | POA: Diagnosis present

## 2018-05-05 DIAGNOSIS — F329 Major depressive disorder, single episode, unspecified: Secondary | ICD-10-CM

## 2018-05-05 DIAGNOSIS — Z8673 Personal history of transient ischemic attack (TIA), and cerebral infarction without residual deficits: Secondary | ICD-10-CM

## 2018-05-05 DIAGNOSIS — G40909 Epilepsy, unspecified, not intractable, without status epilepticus: Principal | ICD-10-CM | POA: Diagnosis present

## 2018-05-05 DIAGNOSIS — R402142 Coma scale, eyes open, spontaneous, at arrival to emergency department: Secondary | ICD-10-CM | POA: Diagnosis present

## 2018-05-05 DIAGNOSIS — Z79899 Other long term (current) drug therapy: Secondary | ICD-10-CM

## 2018-05-05 DIAGNOSIS — R519 Headache, unspecified: Secondary | ICD-10-CM

## 2018-05-05 DIAGNOSIS — N183 Chronic kidney disease, stage 3 unspecified: Secondary | ICD-10-CM | POA: Diagnosis present

## 2018-05-05 DIAGNOSIS — F418 Other specified anxiety disorders: Secondary | ICD-10-CM | POA: Diagnosis present

## 2018-05-05 DIAGNOSIS — R5383 Other fatigue: Secondary | ICD-10-CM

## 2018-05-05 DIAGNOSIS — R51 Headache: Secondary | ICD-10-CM

## 2018-05-05 DIAGNOSIS — Z7901 Long term (current) use of anticoagulants: Secondary | ICD-10-CM

## 2018-05-05 DIAGNOSIS — I129 Hypertensive chronic kidney disease with stage 1 through stage 4 chronic kidney disease, or unspecified chronic kidney disease: Secondary | ICD-10-CM | POA: Diagnosis present

## 2018-05-05 DIAGNOSIS — F1910 Other psychoactive substance abuse, uncomplicated: Secondary | ICD-10-CM | POA: Diagnosis present

## 2018-05-05 DIAGNOSIS — R402242 Coma scale, best verbal response, confused conversation, at arrival to emergency department: Secondary | ICD-10-CM | POA: Diagnosis present

## 2018-05-05 DIAGNOSIS — Z86718 Personal history of other venous thrombosis and embolism: Secondary | ICD-10-CM

## 2018-05-05 DIAGNOSIS — R140 Abdominal distension (gaseous): Secondary | ICD-10-CM

## 2018-05-05 DIAGNOSIS — F32A Depression, unspecified: Secondary | ICD-10-CM

## 2018-05-05 DIAGNOSIS — D72819 Decreased white blood cell count, unspecified: Secondary | ICD-10-CM | POA: Diagnosis present

## 2018-05-05 DIAGNOSIS — I2510 Atherosclerotic heart disease of native coronary artery without angina pectoris: Secondary | ICD-10-CM | POA: Diagnosis present

## 2018-05-05 DIAGNOSIS — E1165 Type 2 diabetes mellitus with hyperglycemia: Secondary | ICD-10-CM | POA: Diagnosis present

## 2018-05-05 DIAGNOSIS — Z981 Arthrodesis status: Secondary | ICD-10-CM

## 2018-05-05 DIAGNOSIS — M4722 Other spondylosis with radiculopathy, cervical region: Secondary | ICD-10-CM

## 2018-05-05 DIAGNOSIS — E1122 Type 2 diabetes mellitus with diabetic chronic kidney disease: Secondary | ICD-10-CM | POA: Diagnosis present

## 2018-05-05 DIAGNOSIS — R402362 Coma scale, best motor response, obeys commands, at arrival to emergency department: Secondary | ICD-10-CM | POA: Diagnosis present

## 2018-05-05 DIAGNOSIS — D649 Anemia, unspecified: Secondary | ICD-10-CM | POA: Diagnosis present

## 2018-05-05 DIAGNOSIS — Z91013 Allergy to seafood: Secondary | ICD-10-CM

## 2018-05-05 DIAGNOSIS — F1010 Alcohol abuse, uncomplicated: Secondary | ICD-10-CM | POA: Diagnosis present

## 2018-05-05 LAB — COMPREHENSIVE METABOLIC PANEL
ALK PHOS: 36 U/L — AB (ref 38–126)
ALT: 18 U/L (ref 0–44)
AST: 19 U/L (ref 15–41)
Albumin: 3.4 g/dL — ABNORMAL LOW (ref 3.5–5.0)
Anion gap: 11 (ref 5–15)
BUN: 24 mg/dL — AB (ref 8–23)
CALCIUM: 8.8 mg/dL — AB (ref 8.9–10.3)
CO2: 18 mmol/L — AB (ref 22–32)
CREATININE: 1.7 mg/dL — AB (ref 0.61–1.24)
Chloride: 112 mmol/L — ABNORMAL HIGH (ref 98–111)
GFR calc Af Amer: 48 mL/min — ABNORMAL LOW (ref >60)
GFR, EST NON AFRICAN AMERICAN: 41 mL/min — AB (ref >60)
Glucose, Bld: 86 mg/dL (ref 70–99)
Potassium: 4.8 mmol/L (ref 3.5–5.1)
Sodium: 141 mmol/L (ref 135–145)
Total Bilirubin: 0.5 mg/dL (ref 0.3–1.2)
Total Protein: 6.1 g/dL — ABNORMAL LOW (ref 6.5–8.1)

## 2018-05-05 LAB — URINALYSIS, ROUTINE W REFLEX MICROSCOPIC
Bilirubin Urine: NEGATIVE
Glucose, UA: NEGATIVE mg/dL
HGB URINE DIPSTICK: NEGATIVE
Ketones, ur: NEGATIVE mg/dL
LEUKOCYTES UA: NEGATIVE
Nitrite: NEGATIVE
PROTEIN: NEGATIVE mg/dL
Specific Gravity, Urine: 1.025 (ref 1.005–1.030)
pH: 5 (ref 5.0–8.0)

## 2018-05-05 LAB — CBC
HEMATOCRIT: 38.6 % — AB (ref 39.0–52.0)
HEMOGLOBIN: 11.8 g/dL — AB (ref 13.0–17.0)
MCH: 30 pg (ref 26.0–34.0)
MCHC: 30.6 g/dL (ref 30.0–36.0)
MCV: 98.2 fL (ref 78.0–100.0)
Platelets: 200 10*3/uL (ref 150–400)
RBC: 3.93 MIL/uL — AB (ref 4.22–5.81)
RDW: 13.3 % (ref 11.5–15.5)
WBC: 3.6 10*3/uL — ABNORMAL LOW (ref 4.0–10.5)

## 2018-05-05 LAB — RAPID URINE DRUG SCREEN, HOSP PERFORMED
Amphetamines: NOT DETECTED
BENZODIAZEPINES: NOT DETECTED
Cocaine: NOT DETECTED
Opiates: POSITIVE — AB
Tetrahydrocannabinol: NOT DETECTED

## 2018-05-05 LAB — TROPONIN I

## 2018-05-05 LAB — PROTIME-INR
INR: 1.4
Prothrombin Time: 17.1 seconds — ABNORMAL HIGH (ref 11.4–15.2)

## 2018-05-05 LAB — LIPASE, BLOOD: LIPASE: 27 U/L (ref 11–51)

## 2018-05-05 LAB — VALPROIC ACID LEVEL: VALPROIC ACID LVL: 21 ug/mL — AB (ref 50.0–100.0)

## 2018-05-05 LAB — ETHANOL: Alcohol, Ethyl (B): 11 mg/dL — ABNORMAL HIGH (ref <10)

## 2018-05-05 MED ORDER — LORAZEPAM 2 MG/ML IJ SOLN
INTRAMUSCULAR | Status: AC
  Filled 2018-05-05: qty 1

## 2018-05-05 MED ORDER — SODIUM CHLORIDE 0.9 % IV SOLN
INTRAVENOUS | Status: DC
  Administered 2018-05-06: via INTRAVENOUS

## 2018-05-05 MED ORDER — SODIUM CHLORIDE 0.9 % IV BOLUS
1000.0000 mL | Freq: Once | INTRAVENOUS | Status: AC
  Administered 2018-05-05: 1000 mL via INTRAVENOUS

## 2018-05-05 MED ORDER — HYDROMORPHONE HCL 1 MG/ML IJ SOLN: 2.0000 mg | INTRAMUSCULAR | Status: DC | PRN

## 2018-05-05 MED ORDER — ONDANSETRON HCL 4 MG/2ML IJ SOLN
4.0000 mg | Freq: Once | INTRAMUSCULAR | Status: AC
  Administered 2018-05-05: 4 mg via INTRAVENOUS
  Filled 2018-05-05: qty 2

## 2018-05-05 NOTE — ED Notes (Addendum)
Back from CT.  Family is at bedside

## 2018-05-05 NOTE — ED Triage Notes (Signed)
Pt arrived from home for c/o shortness of breath, abdominal distention, and generalized pain all over. Pt was recently d/c from hospital on 7/11. Was admitted for complete heart block. BP 118/80 P 81 SR 02 94% RA CBG 99

## 2018-05-05 NOTE — ED Provider Notes (Signed)
Patient placed in Quick Look pathway, seen and evaluated   Chief Complaint: Dyspnea  HPI:   62 year old male presenting by EMS from dyspnea.  EMS reports that the patient was complaining of all over pain and tremors, but has not had tremor since EMS arrival. SaO2 94% on RA with EMS. HR 81. BP 118/80.   In the ED, the patient is complaining of severe neck and upper back pain abdomen is distended.  He states that he feels like he is going to pass out.  He was d/ced on 7/11 after having a heart catheterization and one stent placed. Reports he drank one beer today.   ROS: Dyspnea, neck pain, back pain (one)  Physical Exam:   Gen: No distress  Neuro: Awake and Alert  Skin: Warm    Focused Exam: Patient appears drowsy. Decreased ROM of the cervical spine.  Diffusely tender to palpation to the spinous processes of the cervical and thoracic spine.  Bilateral paraspinal muscles are nontender to palpation.  Patient has episodes where he has shaking noted to the bilateral arms and legs but the patient remains responsive.  Responses are slowed, but speech is not slurred.  Abdomen is distended and tender to palpation in the right upper quadrant without guarding.  Initiation of care has begun. The patient has been counseled on the process, plan, and necessity for staying for the completion/evaluation, and the remainder of the medical screening examination    Barkley Boards, PA-C 05/05/18 2103    Gwyneth Sprout, MD 05/06/18 0025

## 2018-05-05 NOTE — ED Notes (Signed)
Patient transported to CT 

## 2018-05-05 NOTE — ED Notes (Signed)
Pt reports weakness, neck and back pain, and states that he feels like he is going ot pass out. Patient 3.88

## 2018-05-05 NOTE — ED Provider Notes (Signed)
[name] EMERGENCY DEPARTMENT Provider Note   CSN: [id number] Arrival date & time: 05/05/18  2004     History   Chief Complaint Chief Complaint  Patient presents with  . Shortness of Breath    HPI Russell Carter is a 62 y.o. male.  HPI Patient initially presented to the emergency room with complaints of shortness of breath, and generalized pain.  He should not was recently discharged from the hospital on July 11.  He was admitted for issues with complete heart block.  While being brought back to the bed patient had an episode of shaking and decreased responsiveness.  Nurses felt he was having a seizure.  This lasted for 20 seconds.  I was immediately called to the bedside and when I arrived the patient was not shaking and he was answering questions although somewhat slowly.  Patient states he is having headache neck and back pain.  He states this is a chronic issue from prior surgeries.  He feels weak all over. Past Medical History:  Diagnosis Date  . Anginal pain (HCC)   . Anxiety   . Arthritis   . Bipolar disorder (HCC)   . Blood in stool   . CAD (coronary artery disease)    a. 2011: cath showing mod non-obstructive disease b. 02/2016: cath with 95% stenosis in the Mid RCA. DES placed.  . Cervical stenosis of spine   . Cocaine abuse (HCC)    history  . Depression   . Diabetes mellitus without complication (HCC)   . Gout, unspecified   . Headache   . HTN (hypertension)   . Hypercholesterolemia   . Hyperglycemia   . Migraines   . Myocardial infarction [facility]) 2008   drug cocaine and marijuana at time of mi none  since    . Pneumonia   . Rectal bleeding   . Renal insufficiency   . Seizures (HCC)   . Stroke [facility])     Patient Active Problem List   Diagnosis Date Noted  . Acute kidney injury (HCC) 04/27/2018  . Abdominal pain 04/27/2018  . Lactic acidosis 04/27/2018  . Chronic anticoagulation 04/27/2018  . Left knee pain 01/10/2018  . Syncope  09/04/2017  . Left arm weakness 08/07/2017  . Numbness and tingling in both hands 07/31/2017  . Gastroesophageal reflux disease without esophagitis 07/31/2017  . Chronic pain disorder 06/05/2017  . Gastrointestinal hemorrhage with melena   . Pulmonary embolism (HCC) 02/14/2017  . Diabetes mellitus type 2 in nonobese (HCC)   . Cocaine abuse (HCC)   . Depression   . History of CVA (cerebrovascular accident)   . Cervical spondylosis with radiculopathy 01/24/2017  . Dyslipidemia 03/15/2016  . Complete heart block (HCC) 03/14/2016  . Cervical spinal stenosis 03/14/2016  . CAD-50% LAD 2011 12/14/2014  . Gout 11/16/2014  . Polysubstance abuse (HCC) 01/15/2013  . DIZZINESS 06/05/2010  . Essential hypertension 05/18/2009    Past Surgical History:  Procedure Laterality Date  . ANTERIOR CERVICAL DECOMP/DISCECTOMY FUSION N/A 01/24/2017   Procedure: Cervical Four-Five,Cervical Five Six,Cervical Six-Seven Anterior cervical discectomy with fusion and plate with Cervical Three-Thoracic- One Laminectomy/Foraminotomy with Cervical Three-Thoracic- One Fixation and fusion;  Surgeon: Loura Halt Ditty, MD;  Location: [facility] OR;  Service: Neurosurgery;  Laterality: N/A;  Part-1 anterior approach  . CARDIAC CATHETERIZATION  2008; ~ 2011   [place]-40%lad  . CARDIAC CATHETERIZATION N/A 03/16/2016   Procedure: Left Heart Cath and Coronary Angiography;  Surgeon: Marykay Lex, MD;  Location: [facility] INVASIVE CV  LAB;  Service: Cardiovascular;  Laterality: N/A;  . COLONOSCOPY WITH PROPOFOL N/A 04/17/2018   Procedure: COLONOSCOPY WITH PROPOFOL;  Surgeon: Graylin Shiver, MD;  Location: [facility] ENDOSCOPY;  Service: Endoscopy;  Laterality: N/A;  . CORONARY ANGIOPLASTY     STENTS  . EYE SURGERY Bilateral    "for double vision"  . INGUINAL HERNIA REPAIR  05/12/2012   Procedure: HERNIA REPAIR INGUINAL ADULT;  Surgeon: Liz Malady, MD;  Location: [facility];  Service: General;  Laterality: Right;  . INGUINAL  HERNIA REPAIR Left 02/02/2003   Dr. Violeta Gelinas. repair with mesh. Same procedure done again in  05/22/2004   . INGUINAL HERNIA REPAIR Left ?date 2nd OR  . LEFT HEART CATH AND CORONARY ANGIOGRAPHY N/A 04/29/2018   Procedure: LEFT HEART CATH AND CORONARY ANGIOGRAPHY;  Surgeon: [place], Peter M, MD;  Location: [facility] INVASIVE CV LAB;  Service: Cardiovascular;  Laterality: N/A;  . POSTERIOR CERVICAL FUSION/FORAMINOTOMY N/A 01/24/2017   Procedure: Cervical Three-Thoracic One-Laminectomy/Foraminotomy with Cervical Three-Thoracic One Fixation and fusion;  Surgeon: Loura Halt Ditty, MD;  Location: [facility] OR;  Service: Neurosurgery;  Laterality: N/A;  Part-2 Posterior approach  . RECONSTRUCT / STABILIZE DISTAL ULNA    . SHOULDER OPEN ROTATOR CUFF REPAIR Left 2010  . SHOULDER SURGERY Left    "injured on the job; had to cut small bone out"  . THUMB FUSION Right         Home Medications    Prior to Admission medications   Medication Sig Start Date End Date Taking? Authorizing Provider  acetaminophen (TYLENOL) 500 MG tablet Take 1,000 mg by mouth every 6 (six) hours as needed for headache (pain).    [provider]  allopurinol (ZYLOPRIM) 300 MG tablet Take 1 tablet (300 mg total) by mouth daily. 05/02/17   Quentin Angst, MD  CALCIUM-VITAMIN D PO Take 1 tablet by mouth daily.    [provider]  clotrimazole (LOTRIMIN) 1 % cream Apply 1 application topically 2 (two) times daily.    [provider]  divalproex (DEPAKOTE) 250 MG DR tablet Take 250 mg by mouth 3 (three) times daily.    [provider]  DULoxetine (CYMBALTA) 30 MG capsule Take 1 capsule (30 mg total) by mouth 2 (two) times daily. 11/20/17   Anders Simmonds, PA-C  enoxaparin (LOVENOX) 100 MG/ML injection Inject 1 mL (100 mg total) into the skin every 12 (twelve) hours. 05/01/18   Leatha Gilding, MD  gabapentin (NEURONTIN) 400 MG capsule Take 1 capsule (400 mg total) 2 (two) times daily by  mouth. Patient not taking: Reported on 04/28/2018 09/04/17   Quentin Angst, MD  gabapentin (NEURONTIN) 600 MG tablet Take 600 mg by mouth 2 (two) times daily.  07/24/17   [provider]  hydrOXYzine (ATARAX/VISTARIL) 25 MG tablet TAKE 1 TABLET BY MOUTH EVERY 6 HOURS AS NEEDED FOR ANXIETY 02/05/18   Hoy Register, MD  ibuprofen (ADVIL,MOTRIN) 600 MG tablet Take 600 mg by mouth 2 (two) times daily as needed (pain). Take with food 04/17/18   [provider]  losartan (COZAAR) 100 MG tablet TAKE 1 TABLET BY MOUTH DAILY. 04/07/18   Marcine Matar, MD  meclizine (ANTIVERT) 25 MG tablet 1 tab PO daily x 1 week then daily PRN Patient taking differently: Take 25 mg by mouth daily as needed (vertigo).  03/04/18   Marcine Matar, MD  methocarbamol (ROBAXIN) 750 MG tablet TAKE 1 TABLET BY MOUTH EVERY 8 HOURS AS NEEDED  FOR MUSCLE SPASMS. 02/13/18   Hoy Register, MD  metoCLOPramide (REGLAN) 10 MG tablet TAKE 1 TABLET BY MOUTH EVERY 6 HOURS AS NEEDED FOR NAUSEA OR HEADACHE. 04/22/18   Marcine Matar, MD  Multiple Vitamin (MULTIVITAMIN WITH MINERALS) TABS tablet Take 1 tablet by mouth daily.    [provider]  nitroGLYCERIN (NITROSTAT) 0.4 MG SL tablet Place 1 tablet (0.4 mg total) under the tongue every 5 (five) minutes as needed for chest pain. Patient not taking: Reported on 04/11/2018 05/02/17   Quentin Angst, MD  omeprazole (PRILOSEC) 20 MG capsule Take 20 mg by mouth daily. 01/07/18   [provider]  oxyCODONE-acetaminophen (PERCOCET) 7.5-325 MG tablet Take 1 tablet by mouth every 4 (four) hours as needed for severe pain.    [provider]  pantoprazole (PROTONIX) 40 MG tablet Take 1 tablet (40 mg total) by mouth at bedtime. Patient not taking: Reported on 04/28/2018 12/04/17   Quentin Angst, MD  Polyethyl Glycol-Propyl Glycol (SYSTANE OP) Place 1 drop into both eyes 2 (two) times daily.    [provider]  rosuvastatin (CRESTOR)  40 MG tablet Take 40 mg by mouth daily.    [provider]  traZODone (DESYREL) 100 MG tablet Take 100 mg by mouth at bedtime as needed for sleep.    [provider]  warfarin (COUMADIN) 5 MG tablet Take 1 tablet (5 mg total) by mouth daily. Patient taking differently: Take 2.5-5 mg by mouth See admin instructions. Take one tablet (5 mg) by mouth on Monday, Wednesday, Friday morning, take 1/2 tablet (2.5 mg) on Sunday, Tuesday, Thursday, Saturday 01/29/18   Quentin Angst, MD  famotidine (PEPCID) 20 MG tablet Take 1 tablet (20 mg total) by mouth 2 (two) times daily. 12/04/11 12/30/11  Grant Fontana, PA-C  TACRINE HYDROCHLORIDE PO Take 25 mg by mouth daily.    12/30/11  [provider]    Family History Family History  Problem Relation Age of Onset  . Heart attack Mother        x7. Still alive  . Heart attack Father        deceased b/c of MI  . Heart attack Brother        older, deceased b/c of MI   . Cancer Brother        bone cancer in 1 brother, ? brain cancer in other brother    Social History Social History   Tobacco Use  . Smoking status: Never Smoker  . Smokeless tobacco: Never Used  Substance Use Topics  . Alcohol use: Yes    Comment: 16 oz beer every 2-3 days  . Drug use: Yes    Types: "Crack" cocaine, Marijuana, Cocaine    Comment:  "quit 2008"     Allergies   Shellfish allergy   Review of Systems Review of Systems  All other systems reviewed and are negative.    Physical Exam Updated Vital Signs BP 129/79 (BP Location: Right Arm)   Pulse 82   Temp 98 F (36.7 C) (Oral)   Resp 20   Ht 1.803 m (5\' 11" )   Wt 92.1 kg (203 lb)   SpO2 97%   BMI 28.31 kg/m   Physical Exam  Constitutional: He appears ill. No distress.  Listless   HENT:  Head: Normocephalic and atraumatic.  Right Ear: External ear normal.  Left Ear: External ear normal.  Eyes: Conjunctivae are normal. Right eye exhibits no discharge. Left eye  exhibits no  discharge. No scleral icterus.  Neck: Neck supple. No tracheal deviation present.  Cardiovascular: Normal rate, regular rhythm and intact distal pulses.  Pulmonary/Chest: Effort normal and breath sounds normal. No stridor. No respiratory distress. He has no wheezes. He has no rales.  Abdominal: Soft. Bowel sounds are normal. He exhibits no distension. There is no tenderness. There is no rebound and no guarding.  Musculoskeletal: He exhibits no edema.       Cervical back: He exhibits tenderness.  Neurological: He is alert. He has normal strength. No cranial nerve deficit (no facial droop, extraocular movements intact, no slurred speech) or sensory deficit. He exhibits normal muscle tone. He displays no seizure activity. Coordination normal. GCS eye subscore is 4. GCS verbal subscore is 4. GCS motor subscore is 6.  Weakness left leg and left arm, difficulty lifting off the bed  Skin: Skin is warm and dry. No rash noted.  Psychiatric: He has a normal mood and affect.  Nursing note and vitals reviewed.    ED Treatments / Results  Labs (all labs ordered are listed, but only abnormal results are displayed) Labs Reviewed  CBC - Abnormal; Notable for the following components:      Result Value   WBC 3.6 (*)    RBC 3.93 (*)    Hemoglobin 11.8 (*)    HCT 38.6 (*)    All other components within normal limits  COMPREHENSIVE METABOLIC PANEL - Abnormal; Notable for the following components:   Chloride 112 (*)    CO2 18 (*)    BUN 24 (*)    Creatinine, Ser 1.70 (*)    Calcium 8.8 (*)    Total Protein 6.1 (*)    Albumin 3.4 (*)    Alkaline Phosphatase 36 (*)    GFR calc non Af Amer 41 (*)    GFR calc Af Amer 48 (*)    All other components within normal limits  ETHANOL - Abnormal; Notable for the following components:   Alcohol, Ethyl (B) 11 (*)    All other components within normal limits  RAPID URINE DRUG SCREEN, HOSP PERFORMED - Abnormal; Notable for the following components:    Opiates POSITIVE (*)    Barbiturates   (*)    Value: Result not available. Reagent lot number recalled by manufacturer.   All other components within normal limits  VALPROIC ACID LEVEL - Abnormal; Notable for the following components:   Valproic Acid Lvl 21 (*)    All other components within normal limits  PROTIME-INR - Abnormal; Notable for the following components:   Prothrombin Time 17.1 (*)    All other components within normal limits  LIPASE, BLOOD  TROPONIN I  URINALYSIS, ROUTINE W REFLEX MICROSCOPIC  I-STAT TROPONIN, ED    EKG EKG Interpretation  Date/Time:  Monday May 05 2018 20:13:51 EDT Ventricular Rate:  80 PR Interval:  168 QRS Duration: 76 QT Interval:  366 QTC Calculation: 422 R Axis:   15 Text Interpretation:  Normal sinus rhythm Normal ECG No old tracing to compare Confirmed by Linwood Dibbles [phone]) on 05/05/2018 10:48:46 PM   Radiology Ct Head Wo Contrast  Result Date: 05/06/2018 CLINICAL DATA:  62 year old male with trauma. EXAM: CT HEAD WITHOUT CONTRAST CT CERVICAL SPINE WITHOUT CONTRAST TECHNIQUE: Multidetector CT imaging of the head and cervical spine was performed following the standard protocol without intravenous contrast. Multiplanar CT image reconstructions of the cervical spine were also generated. COMPARISON:  Head CT dated 04/27/2018 FINDINGS: CT HEAD FINDINGS Brain: There  is mild age-related atrophy and chronic microvascular ischemic changes. There is no acute intracranial hemorrhage. No mass effect or midline shift. No extra-axial fluid collection. Vascular: No hyperdense vessel or unexpected calcification. Skull: Normal. Negative for fracture or focal lesion. Sinuses/Orbits: Minimal mucoperiosteal thickening of paranasal sinuses. No air-fluid levels. The mastoid air cells are clear. Other: None CT CERVICAL SPINE FINDINGS Alignment: No acute subluxation. Skull base and vertebrae: No definite acute fracture. Evaluation is however very limited due to  streak artifact caused by spinal fixation hardware. Soft tissues and spinal canal: No paraspinal swelling or collection. Evaluation is very limited due to streak artifact. Disc levels: C4-C7 ACDF and C3-T1 posterior fusion and laminectomy. The fixation hardware appears intact and similar to prior CT. Upper chest: Negative. Other: None IMPRESSION: 1. No acute intracranial hemorrhage. 2. No definite acute/traumatic cervical spine pathology. Evaluation however is limited due to streak artifact caused by metallic fusion hardware. No evidence of hardware fracture. Electronically Signed   By: Elgie Collard M.D.   On: 05/06/2018 00:00   Ct Cervical Spine Wo Contrast  Result Date: 05/06/2018 CLINICAL DATA:  62 year old male with trauma. EXAM: CT HEAD WITHOUT CONTRAST CT CERVICAL SPINE WITHOUT CONTRAST TECHNIQUE: Multidetector CT imaging of the head and cervical spine was performed following the standard protocol without intravenous contrast. Multiplanar CT image reconstructions of the cervical spine were also generated. COMPARISON:  Head CT dated 04/27/2018 FINDINGS: CT HEAD FINDINGS Brain: There is mild age-related atrophy and chronic microvascular ischemic changes. There is no acute intracranial hemorrhage. No mass effect or midline shift. No extra-axial fluid collection. Vascular: No hyperdense vessel or unexpected calcification. Skull: Normal. Negative for fracture or focal lesion. Sinuses/Orbits: Minimal mucoperiosteal thickening of paranasal sinuses. No air-fluid levels. The mastoid air cells are clear. Other: None CT CERVICAL SPINE FINDINGS Alignment: No acute subluxation. Skull base and vertebrae: No definite acute fracture. Evaluation is however very limited due to streak artifact caused by spinal fixation hardware. Soft tissues and spinal canal: No paraspinal swelling or collection. Evaluation is very limited due to streak artifact. Disc levels: C4-C7 ACDF and C3-T1 posterior fusion and laminectomy. The  fixation hardware appears intact and similar to prior CT. Upper chest: Negative. Other: None IMPRESSION: 1. No acute intracranial hemorrhage. 2. No definite acute/traumatic cervical spine pathology. Evaluation however is limited due to streak artifact caused by metallic fusion hardware. No evidence of hardware fracture. Electronically Signed   By: Elgie Collard M.D.   On: 05/06/2018 00:00   Dg Chest Portable 1 View  Result Date: 05/05/2018 CLINICAL DATA:  Weakness, shortness of breath and abdominal pain. Lethargy. EXAM: PORTABLE CHEST 1 VIEW COMPARISON:  05/15/2017. FINDINGS: Chronic LEFT hemidiaphragm elevation. Normal heart size. 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 No consolidation or edema. No effusion or pneumothorax. Previous cervical spine surgery. IMPRESSION: No active disease. Electronically Signed   By: Elsie Stain M.D.   On: 05/05/2018 21:13    Procedures Procedures (including critical care time)  Medications Ordered in ED Medications  sodium chloride 0.9 % bolus 1,000 mL (0 mLs Intravenous Stopped 05/05/18 2247)    And  0.9 %  sodium chloride infusion ( Intravenous New Bag/Given 05/06/18 0010)  ondansetron (ZOFRAN) injection 4 mg (4 mg Intravenous Given 05/05/18 2147)     Initial Impression / Assessment and Plan / ED Course  I have reviewed the triage vital signs and the nursing notes.  Pertinent labs & imaging results that were available during my care of the patient were reviewed by me  and considered in my medical decision making (see chart for details).  Clinical Course as of May 06 13  Tue May 06, 2018  0011 Labs notable for elevated BUN and creatinine.  Increased from previous values.  Patient appears to have acute kidney injury.  Hemoglobin is stable.  Valproic acid levels also decreased   [JK]  0013 She remains somnolent.  Difficult to arouse.   [JK]    Clinical Course User Index [JK] Linwood Dibbles, MD    Patient presented to the emergency room after recently  being in the hospital for abdominal pain, heart block, syncope and acute kidney injury.  Patient is presenting to the emergency room with recurrent symptoms.  Initially presented to the ED with complaints of shortness of breath however he had a seizure in the emergency room.  Patient was communicating however he remains very somnolent.  He may be postictal.  Patient was started on IV fluids.  No signs of acute infection.  No signs of acute cardiac ischemia.  CT scans without acute findings.  Patient remains persistently somnolent difficult to arouse.  Plan on admission for further treatment and evaluation  Final Clinical Impressions(s) / ED Diagnoses   Final diagnoses:  Seizure (HCC)  Lethargy  AKI (acute kidney injury) (HCC)      Linwood Dibbles, MD 05/06/18 [phone]

## 2018-05-05 NOTE — ED Notes (Signed)
Pt extremely lethargic, requiring sternal rub for response. Upon transfering pt from wheelchair to bed pt paced in gown and placed on cardiac monitoring. Pt then had a seizure lasting approx 20-30 seconds. IV placed, and MD informed. Pt slow to respond after.

## 2018-05-06 ENCOUNTER — Encounter (HOSPITAL_COMMUNITY): Payer: Self-pay | Admitting: Family Medicine

## 2018-05-06 ENCOUNTER — Ambulatory Visit: Payer: Self-pay | Admitting: Pharmacist

## 2018-05-06 ENCOUNTER — Inpatient Hospital Stay (HOSPITAL_COMMUNITY): Payer: Medicaid Other

## 2018-05-06 DIAGNOSIS — E1122 Type 2 diabetes mellitus with diabetic chronic kidney disease: Secondary | ICD-10-CM | POA: Diagnosis present

## 2018-05-06 DIAGNOSIS — R569 Unspecified convulsions: Secondary | ICD-10-CM

## 2018-05-06 DIAGNOSIS — N179 Acute kidney failure, unspecified: Secondary | ICD-10-CM | POA: Diagnosis present

## 2018-05-06 DIAGNOSIS — I69354 Hemiplegia and hemiparesis following cerebral infarction affecting left non-dominant side: Secondary | ICD-10-CM | POA: Diagnosis not present

## 2018-05-06 DIAGNOSIS — D72819 Decreased white blood cell count, unspecified: Secondary | ICD-10-CM | POA: Diagnosis not present

## 2018-05-06 DIAGNOSIS — N183 Chronic kidney disease, stage 3 unspecified: Secondary | ICD-10-CM | POA: Diagnosis present

## 2018-05-06 DIAGNOSIS — I219 Acute myocardial infarction, unspecified: Secondary | ICD-10-CM | POA: Diagnosis present

## 2018-05-06 DIAGNOSIS — R402362 Coma scale, best motor response, obeys commands, at arrival to emergency department: Secondary | ICD-10-CM | POA: Diagnosis present

## 2018-05-06 DIAGNOSIS — I2510 Atherosclerotic heart disease of native coronary artery without angina pectoris: Secondary | ICD-10-CM | POA: Diagnosis present

## 2018-05-06 DIAGNOSIS — Z86711 Personal history of pulmonary embolism: Secondary | ICD-10-CM | POA: Diagnosis not present

## 2018-05-06 DIAGNOSIS — D649 Anemia, unspecified: Secondary | ICD-10-CM | POA: Diagnosis present

## 2018-05-06 DIAGNOSIS — G40909 Epilepsy, unspecified, not intractable, without status epilepticus: Secondary | ICD-10-CM | POA: Diagnosis not present

## 2018-05-06 DIAGNOSIS — R402142 Coma scale, eyes open, spontaneous, at arrival to emergency department: Secondary | ICD-10-CM | POA: Diagnosis present

## 2018-05-06 DIAGNOSIS — G894 Chronic pain syndrome: Secondary | ICD-10-CM | POA: Diagnosis not present

## 2018-05-06 DIAGNOSIS — Z7901 Long term (current) use of anticoagulants: Secondary | ICD-10-CM | POA: Diagnosis not present

## 2018-05-06 DIAGNOSIS — F1910 Other psychoactive substance abuse, uncomplicated: Secondary | ICD-10-CM | POA: Diagnosis not present

## 2018-05-06 DIAGNOSIS — Z981 Arthrodesis status: Secondary | ICD-10-CM | POA: Diagnosis not present

## 2018-05-06 DIAGNOSIS — E1165 Type 2 diabetes mellitus with hyperglycemia: Secondary | ICD-10-CM | POA: Diagnosis present

## 2018-05-06 DIAGNOSIS — D631 Anemia in chronic kidney disease: Secondary | ICD-10-CM | POA: Diagnosis present

## 2018-05-06 DIAGNOSIS — Z8249 Family history of ischemic heart disease and other diseases of the circulatory system: Secondary | ICD-10-CM | POA: Diagnosis not present

## 2018-05-06 DIAGNOSIS — Z79899 Other long term (current) drug therapy: Secondary | ICD-10-CM | POA: Diagnosis not present

## 2018-05-06 DIAGNOSIS — Z86718 Personal history of other venous thrombosis and embolism: Secondary | ICD-10-CM | POA: Diagnosis not present

## 2018-05-06 DIAGNOSIS — I10 Essential (primary) hypertension: Secondary | ICD-10-CM | POA: Diagnosis not present

## 2018-05-06 DIAGNOSIS — F1010 Alcohol abuse, uncomplicated: Secondary | ICD-10-CM | POA: Diagnosis present

## 2018-05-06 DIAGNOSIS — Z955 Presence of coronary angioplasty implant and graft: Secondary | ICD-10-CM | POA: Diagnosis not present

## 2018-05-06 DIAGNOSIS — I129 Hypertensive chronic kidney disease with stage 1 through stage 4 chronic kidney disease, or unspecified chronic kidney disease: Secondary | ICD-10-CM | POA: Diagnosis present

## 2018-05-06 DIAGNOSIS — Z91013 Allergy to seafood: Secondary | ICD-10-CM | POA: Diagnosis not present

## 2018-05-06 DIAGNOSIS — R402242 Coma scale, best verbal response, confused conversation, at arrival to emergency department: Secondary | ICD-10-CM | POA: Diagnosis present

## 2018-05-06 DIAGNOSIS — F418 Other specified anxiety disorders: Secondary | ICD-10-CM | POA: Diagnosis present

## 2018-05-06 HISTORY — DX: Unspecified convulsions: R56.9

## 2018-05-06 HISTORY — DX: Decreased white blood cell count, unspecified: D72.819

## 2018-05-06 HISTORY — DX: Anemia, unspecified: D64.9

## 2018-05-06 HISTORY — DX: Chronic kidney disease, stage 3 unspecified: N18.30

## 2018-05-06 LAB — BASIC METABOLIC PANEL
Anion gap: 4 — ABNORMAL LOW (ref 5–15)
BUN: 21 mg/dL (ref 8–23)
CALCIUM: 8.2 mg/dL — AB (ref 8.9–10.3)
CHLORIDE: 111 mmol/L (ref 98–111)
CO2: 25 mmol/L (ref 22–32)
CREATININE: 1.42 mg/dL — AB (ref 0.61–1.24)
GFR calc non Af Amer: 51 mL/min — ABNORMAL LOW (ref >60)
GFR, EST AFRICAN AMERICAN: 59 mL/min — AB (ref >60)
Glucose, Bld: 111 mg/dL — ABNORMAL HIGH (ref 70–99)
Potassium: 4.4 mmol/L (ref 3.5–5.1)
SODIUM: 140 mmol/L (ref 135–145)

## 2018-05-06 LAB — IRON AND TIBC
IRON: 49 ug/dL (ref 45–182)
Saturation Ratios: 16 % — ABNORMAL LOW (ref 17.9–39.5)
TIBC: 304 ug/dL (ref 250–450)
UIBC: 255 ug/dL

## 2018-05-06 LAB — MRSA PCR SCREENING: MRSA by PCR: NEGATIVE

## 2018-05-06 LAB — CBC
HCT: 38.4 % — ABNORMAL LOW (ref 39.0–52.0)
HEMOGLOBIN: 11.6 g/dL — AB (ref 13.0–17.0)
MCH: 29.7 pg (ref 26.0–34.0)
MCHC: 30.2 g/dL (ref 30.0–36.0)
MCV: 98.5 fL (ref 78.0–100.0)
PLATELETS: 179 10*3/uL (ref 150–400)
RBC: 3.9 MIL/uL — AB (ref 4.22–5.81)
RDW: 13.3 % (ref 11.5–15.5)
WBC: 3.1 10*3/uL — AB (ref 4.0–10.5)

## 2018-05-06 LAB — FOLATE: Folate: 7.3 ng/mL (ref >5.9)

## 2018-05-06 LAB — RETICULOCYTES
RBC.: 3.9 MIL/uL — ABNORMAL LOW (ref 4.22–5.81)
Retic Count, Absolute: 39 10*3/uL (ref 19.0–186.0)
Retic Ct Pct: 1 % (ref 0.4–3.1)

## 2018-05-06 LAB — FERRITIN: Ferritin: 107 ng/mL (ref 24–336)

## 2018-05-06 LAB — VITAMIN B12: VITAMIN B 12: 279 pg/mL (ref 180–914)

## 2018-05-06 MED ORDER — VITAMIN B-1 100 MG PO TABS
100.0000 mg | ORAL_TABLET | Freq: Every day | ORAL | Status: DC
  Administered 2018-05-06 – 2018-05-07 (×2): 100 mg via ORAL
  Filled 2018-05-06 (×2): qty 1

## 2018-05-06 MED ORDER — LORAZEPAM 1 MG PO TABS: 1.0000 mg | ORAL_TABLET | Freq: Four times a day (QID) | ORAL | Status: DC | PRN

## 2018-05-06 MED ORDER — ALLOPURINOL 300 MG PO TABS
300.0000 mg | ORAL_TABLET | Freq: Every day | ORAL | Status: DC
  Administered 2018-05-06 – 2018-05-07 (×2): 300 mg via ORAL
  Filled 2018-05-06 (×3): qty 1

## 2018-05-06 MED ORDER — VALPROATE SODIUM 500 MG/5ML IV SOLN
125.0000 mg | Freq: Four times a day (QID) | INTRAVENOUS | Status: DC
  Administered 2018-05-06: 125 mg via INTRAVENOUS
  Filled 2018-05-06 (×3): qty 1.25

## 2018-05-06 MED ORDER — DULOXETINE HCL 30 MG PO CPEP
30.0000 mg | ORAL_CAPSULE | Freq: Two times a day (BID) | ORAL | Status: DC
  Administered 2018-05-06 – 2018-05-07 (×3): 30 mg via ORAL
  Filled 2018-05-06 (×5): qty 1

## 2018-05-06 MED ORDER — ROSUVASTATIN CALCIUM 20 MG PO TABS
40.0000 mg | ORAL_TABLET | Freq: Every day | ORAL | Status: DC
  Administered 2018-05-06: 40 mg via ORAL
  Filled 2018-05-06: qty 1
  Filled 2018-05-06: qty 2

## 2018-05-06 MED ORDER — LORAZEPAM 2 MG/ML IJ SOLN: 1.0000 mg | Freq: Four times a day (QID) | INTRAMUSCULAR | Status: DC | PRN

## 2018-05-06 MED ORDER — LORAZEPAM 1 MG PO TABS
0.0000 mg | ORAL_TABLET | Freq: Four times a day (QID) | ORAL | Status: DC
  Administered 2018-05-06: 2 mg via ORAL
  Administered 2018-05-07: 1 mg via ORAL
  Filled 2018-05-06: qty 1
  Filled 2018-05-06: qty 2

## 2018-05-06 MED ORDER — ACETAMINOPHEN 325 MG PO TABS: 650.0000 mg | ORAL_TABLET | Freq: Four times a day (QID) | ORAL | Status: DC | PRN

## 2018-05-06 MED ORDER — ACETAMINOPHEN 650 MG RE SUPP: 650.0000 mg | Freq: Four times a day (QID) | RECTAL | Status: DC | PRN

## 2018-05-06 MED ORDER — ENOXAPARIN SODIUM 100 MG/ML ~~LOC~~ SOLN
100.0000 mg | Freq: Two times a day (BID) | SUBCUTANEOUS | Status: DC
  Administered 2018-05-06 – 2018-05-07 (×2): 100 mg via SUBCUTANEOUS
  Filled 2018-05-06 (×3): qty 1

## 2018-05-06 MED ORDER — SODIUM CHLORIDE 0.9 % IV SOLN
INTRAVENOUS | Status: AC
  Administered 2018-05-06: 1 mL via INTRAVENOUS

## 2018-05-06 MED ORDER — ENOXAPARIN SODIUM 100 MG/ML ~~LOC~~ SOLN
95.0000 mg | Freq: Two times a day (BID) | SUBCUTANEOUS | Status: DC
  Administered 2018-05-06: 95 mg via SUBCUTANEOUS
  Filled 2018-05-06: qty 0.95

## 2018-05-06 MED ORDER — ONDANSETRON HCL 4 MG PO TABS: 4.0000 mg | ORAL_TABLET | Freq: Four times a day (QID) | ORAL | Status: DC | PRN

## 2018-05-06 MED ORDER — PANTOPRAZOLE SODIUM 40 MG PO TBEC
40.0000 mg | DELAYED_RELEASE_TABLET | Freq: Every day | ORAL | Status: DC
  Administered 2018-05-06 – 2018-05-07 (×2): 40 mg via ORAL
  Filled 2018-05-06 (×2): qty 1

## 2018-05-06 MED ORDER — LORAZEPAM 1 MG PO TABS: 0.0000 mg | ORAL_TABLET | Freq: Two times a day (BID) | ORAL | Status: DC

## 2018-05-06 MED ORDER — THIAMINE HCL 100 MG/ML IJ SOLN: 100.0000 mg | Freq: Every day | INTRAMUSCULAR | Status: DC

## 2018-05-06 MED ORDER — DIVALPROEX SODIUM 250 MG PO DR TAB
250.0000 mg | DELAYED_RELEASE_TABLET | Freq: Once | ORAL | Status: AC
  Administered 2018-05-06: 250 mg via ORAL
  Filled 2018-05-06: qty 1

## 2018-05-06 MED ORDER — MECLIZINE HCL 25 MG PO TABS
25.0000 mg | ORAL_TABLET | Freq: Every day | ORAL | Status: DC | PRN
  Filled 2018-05-06: qty 1

## 2018-05-06 MED ORDER — METHOCARBAMOL 500 MG PO TABS: 500.0000 mg | ORAL_TABLET | Freq: Three times a day (TID) | ORAL | Status: DC | PRN

## 2018-05-06 MED ORDER — OXYCODONE-ACETAMINOPHEN 5-325 MG PO TABS
1.0000 | ORAL_TABLET | ORAL | Status: DC | PRN
  Administered 2018-05-06 (×3): 2 via ORAL
  Filled 2018-05-06 (×3): qty 2

## 2018-05-06 MED ORDER — POLYVINYL ALCOHOL 1.4 % OP SOLN
Freq: Two times a day (BID) | OPHTHALMIC | Status: DC
  Administered 2018-05-06 (×2): via OPHTHALMIC
  Filled 2018-05-06: qty 15

## 2018-05-06 MED ORDER — LORAZEPAM 2 MG/ML IJ SOLN
1.0000 mg | Freq: Four times a day (QID) | INTRAMUSCULAR | Status: DC | PRN
  Filled 2018-05-06: qty 1

## 2018-05-06 MED ORDER — GABAPENTIN 600 MG PO TABS
300.0000 mg | ORAL_TABLET | Freq: Two times a day (BID) | ORAL | Status: DC
  Administered 2018-05-06 – 2018-05-07 (×3): 300 mg via ORAL
  Filled 2018-05-06 (×2): qty 1
  Filled 2018-05-06 (×2): qty 0.5

## 2018-05-06 MED ORDER — DIVALPROEX SODIUM 500 MG PO DR TAB
500.0000 mg | DELAYED_RELEASE_TABLET | Freq: Two times a day (BID) | ORAL | Status: DC
  Administered 2018-05-06 – 2018-05-07 (×2): 500 mg via ORAL
  Filled 2018-05-06 (×3): qty 1

## 2018-05-06 MED ORDER — TRAZODONE HCL 100 MG PO TABS
100.0000 mg | ORAL_TABLET | Freq: Every evening | ORAL | Status: DC | PRN
Start: 2018-05-06 — End: 2018-05-07

## 2018-05-06 MED ORDER — WARFARIN - PHARMACIST DOSING INPATIENT: Freq: Every day | Status: DC

## 2018-05-06 MED ORDER — HYDROXYZINE HCL 25 MG PO TABS: 25.0000 mg | ORAL_TABLET | Freq: Four times a day (QID) | ORAL | Status: DC | PRN

## 2018-05-06 MED ORDER — FOLIC ACID 1 MG PO TABS
1.0000 mg | ORAL_TABLET | Freq: Every day | ORAL | Status: DC
  Administered 2018-05-06 – 2018-05-07 (×2): 1 mg via ORAL
  Filled 2018-05-06 (×2): qty 1

## 2018-05-06 MED ORDER — DIVALPROEX SODIUM 250 MG PO DR TAB
250.0000 mg | DELAYED_RELEASE_TABLET | Freq: Three times a day (TID) | ORAL | Status: DC
  Administered 2018-05-06: 250 mg via ORAL
  Filled 2018-05-06: qty 1

## 2018-05-06 MED ORDER — SODIUM CHLORIDE 0.9% FLUSH
3.0000 mL | Freq: Two times a day (BID) | INTRAVENOUS | Status: DC
  Administered 2018-05-06 (×2): 3 mL via INTRAVENOUS

## 2018-05-06 MED ORDER — WARFARIN SODIUM 5 MG PO TABS
5.0000 mg | ORAL_TABLET | Freq: Once | ORAL | Status: AC
  Administered 2018-05-06: 5 mg via ORAL
  Filled 2018-05-06 (×2): qty 1

## 2018-05-06 MED ORDER — WARFARIN SODIUM 5 MG PO TABS: 5.0000 mg | ORAL_TABLET | Freq: Once | ORAL | Status: DC

## 2018-05-06 MED ORDER — ADULT MULTIVITAMIN W/MINERALS CH
1.0000 | ORAL_TABLET | Freq: Every day | ORAL | Status: DC
  Administered 2018-05-06 – 2018-05-07 (×2): 1 via ORAL
  Filled 2018-05-06 (×2): qty 1

## 2018-05-06 MED ORDER — LORAZEPAM 1 MG PO TABS
1.0000 mg | ORAL_TABLET | Freq: Four times a day (QID) | ORAL | Status: DC
  Administered 2018-05-06 – 2018-05-07 (×3): 1 mg via ORAL
  Filled 2018-05-06 (×4): qty 1

## 2018-05-06 MED ORDER — ONDANSETRON HCL 4 MG/2ML IJ SOLN: 4.0000 mg | Freq: Four times a day (QID) | INTRAMUSCULAR | Status: DC | PRN

## 2018-05-06 NOTE — H&P (Signed)
History and Physical    Russell Carter ZOX:[id number] DOB: 1956/03/23 DOA: 05/05/2018  PCP: Marcine Matar, MD   Patient coming from: Home   Chief Complaint: Tremor, SOB, pain all over   HPI: Russell Carter is a 62 y.o. male with medical history significant for polysubstance abuse, alcohol abuse, coronary artery disease, depression with anxiety, seizures, hypertension, and recent admission with syncope and cardiac arrhythmia, now presenting to the ED with reports of shortness of breath and tremor.  Patient reports that he has been experiencing pain "all over," mainly in his neck which she describes as chronic.  He reported dyspnea at triage, but denies that on admission.  He had reported tremors to the triage nurse, later stating that he had a seizure at home that prompted his presentation.  He denies recent fall, change in vision or hearing, or focal numbness or weakness.  He denies headache, but reports chronic neck pain for which she has had surgery.  Reports that he is no longer using illicit substances, has cut back on his drinking, and has also stopped some of his prescription medications including Depakote.  ED Course: Upon arrival to the ED, patient is found to be afebrile, saturating well on room air, and with vitals otherwise stable.  EKG features a sinus rhythm, chest x-ray is negative for acute cardiopulmonary disease, head CT is negative for acute finding, cervical spine CT is negative for definite acute abnormality but limited due to artifact from prosthesis.  Chemistry panel is notable for a creatinine of 1.70, up from 1.35 a few days earlier.  CBC features a normocytic anemia with hemoglobin of 11.8, down from 13.5 for few days earlier, as well as a new leukopenia with WBC 3600.  INR subtherapeutic at 1.40.  RN witnessed generalized seizure-like activity in the ED that resolved without intervention after a few seconds.  This was followed by somnolence.  Patient has remained  hemodynamically stable, in no apparent respiratory distress, has become more alert and is fully oriented, and will be admitted for ongoing evaluation and management.  Review of Systems:  All other systems reviewed and apart from HPI, are negative.  Past Medical History:  Diagnosis Date  . Anginal pain (HCC)   . Anxiety   . Arthritis   . Bipolar disorder (HCC)   . Blood in stool   . CAD (coronary artery disease)    a. 2011: cath showing mod non-obstructive disease b. 02/2016: cath with 95% stenosis in the Mid RCA. DES placed.  . Cervical stenosis of spine   . Cocaine abuse (HCC)    history  . Depression   . Diabetes mellitus without complication (HCC)   . Gout, unspecified   . Headache   . HTN (hypertension)   . Hypercholesterolemia   . Hyperglycemia   . Migraines   . Myocardial infarction [facility]) 2008   drug cocaine and marijuana at time of mi none  since    . Pneumonia   . Rectal bleeding   . Renal insufficiency   . Seizures (HCC)   . Stroke [facility])     Past Surgical History:  Procedure Laterality Date  . ANTERIOR CERVICAL DECOMP/DISCECTOMY FUSION N/A 01/24/2017   Procedure: Cervical Four-Five,Cervical Five Six,Cervical Six-Seven Anterior cervical discectomy with fusion and plate with Cervical Three-Thoracic- One Laminectomy/Foraminotomy with Cervical Three-Thoracic- One Fixation and fusion;  Surgeon: Loura Halt Ditty, MD;  Location: [facility] OR;  Service: Neurosurgery;  Laterality: N/A;  Part-1 anterior approach  . CARDIAC CATHETERIZATION  2008; ~  2011   Cavour-40%lad  . CARDIAC CATHETERIZATION N/A 03/16/2016   Procedure: Left Heart Cath and Coronary Angiography;  Surgeon: Marykay Lex, MD;  Location: [facility] INVASIVE CV LAB;  Service: Cardiovascular;  Laterality: N/A;  . COLONOSCOPY WITH PROPOFOL N/A 04/17/2018   Procedure: COLONOSCOPY WITH PROPOFOL;  Surgeon: Graylin Shiver, MD;  Location: [facility] ENDOSCOPY;  Service: Endoscopy;  Laterality: N/A;  . CORONARY ANGIOPLASTY     STENTS    . EYE SURGERY Bilateral    "for double vision"  . INGUINAL HERNIA REPAIR  05/12/2012   Procedure: HERNIA REPAIR INGUINAL ADULT;  Surgeon: Liz Malady, MD;  Location: [facility];  Service: General;  Laterality: Right;  . INGUINAL HERNIA REPAIR Left 02/02/2003   Dr. Violeta Gelinas. repair with mesh. Same procedure done again in  05/22/2004   . INGUINAL HERNIA REPAIR Left ?date 2nd OR  . LEFT HEART CATH AND CORONARY ANGIOGRAPHY N/A 04/29/2018   Procedure: LEFT HEART CATH AND CORONARY ANGIOGRAPHY;  Surgeon: Swaziland, Peter M, MD;  Location: [facility] INVASIVE CV LAB;  Service: Cardiovascular;  Laterality: N/A;  . POSTERIOR CERVICAL FUSION/FORAMINOTOMY N/A 01/24/2017   Procedure: Cervical Three-Thoracic One-Laminectomy/Foraminotomy with Cervical Three-Thoracic One Fixation and fusion;  Surgeon: Loura Halt Ditty, MD;  Location: [facility] OR;  Service: Neurosurgery;  Laterality: N/A;  Part-2 Posterior approach  . RECONSTRUCT / STABILIZE DISTAL ULNA    . SHOULDER OPEN ROTATOR CUFF REPAIR Left 2010  . SHOULDER SURGERY Left    "injured on the job; had to cut small bone out"  . THUMB FUSION Right      reports that he has never smoked. He has never used smokeless tobacco. He reports that he drinks alcohol. He reports that he has current or past drug history. Drugs: "Crack" cocaine, Marijuana, and Cocaine.  Allergies  Allergen Reactions  . Shellfish Allergy Anaphylaxis    Family History  Problem Relation Age of Onset  . Heart attack Mother        x7. Still alive  . Heart attack Father        deceased b/c of MI  . Heart attack Brother        older, deceased b/c of MI   . Cancer Brother        bone cancer in 1 brother, ? brain cancer in other brother     Prior to Admission medications   Medication Sig Start Date End Date Taking? Authorizing Provider  acetaminophen (TYLENOL) 500 MG tablet Take 1,000 mg by mouth every 6 (six) hours as needed for headache (pain).   Yes [provider]  allopurinol (ZYLOPRIM) 300 MG tablet Take 1 tablet (300 mg total) by mouth daily. 05/02/17  Yes Quentin Angst, MD  CALCIUM-VITAMIN D PO Take 1 tablet by mouth daily.   Yes [provider]  clotrimazole (LOTRIMIN) 1 % cream Apply 1 application topically 2 (two) times daily.   Yes [provider]  divalproex (DEPAKOTE) 250 MG DR tablet Take 250 mg by mouth 3 (three) times daily.   Yes [provider]  DULoxetine (CYMBALTA) 30 MG capsule Take 1 capsule (30 mg total) by mouth 2 (two) times daily. 11/20/17  Yes Anders Simmonds, PA-C  enoxaparin (LOVENOX) 100 MG/ML injection Inject 1 mL (100 mg total) into the skin every 12 (twelve) hours. 05/01/18  Yes Gherghe, Daylene Katayama, MD  gabapentin (NEURONTIN) 600 MG tablet Take 600 mg by mouth 2 (two) times daily.  07/24/17  Yes [provider]  hydrOXYzine (ATARAX/VISTARIL) 25 MG tablet TAKE 1 TABLET BY MOUTH EVERY 6 HOURS AS NEEDED FOR ANXIETY 02/05/18  Yes Newlin, Enobong, MD  ibuprofen (ADVIL,MOTRIN) 600 MG tablet Take 600 mg by mouth 2 (two) times daily as needed (pain). Take with food 04/17/18  Yes [provider]  losartan (COZAAR) 100 MG tablet TAKE 1 TABLET BY MOUTH DAILY. 04/07/18  Yes Marcine Matar, MD  meclizine (ANTIVERT) 25 MG tablet 1 tab PO daily x 1 week then daily PRN Patient taking differently: Take 25 mg by mouth daily as needed (vertigo).  03/04/18  Yes Marcine Matar, MD  methocarbamol (ROBAXIN) 750 MG tablet TAKE 1 TABLET BY MOUTH EVERY 8 HOURS AS NEEDED FOR MUSCLE SPASMS. 02/13/18  Yes Newlin, Odette Horns, MD  metoCLOPramide (REGLAN) 10 MG tablet TAKE 1 TABLET BY MOUTH EVERY 6 HOURS AS NEEDED FOR NAUSEA OR HEADACHE. 04/22/18  Yes Marcine Matar, MD  Multiple Vitamin (MULTIVITAMIN WITH MINERALS) TABS tablet Take 1 tablet by mouth daily.   Yes [provider]  omeprazole (PRILOSEC) 20 MG capsule Take 20 mg by mouth daily. 01/07/18  Yes [provider]   oxyCODONE-acetaminophen (PERCOCET) 7.5-325 MG tablet Take 1 tablet by mouth every 4 (four) hours as needed for severe pain.   Yes [provider]  Polyethyl Glycol-Propyl Glycol (SYSTANE OP) Place 1 drop into both eyes 2 (two) times daily.   Yes [provider]  rosuvastatin (CRESTOR) 40 MG tablet Take 40 mg by mouth daily.   Yes [provider]  traZODone (DESYREL) 100 MG tablet Take 100 mg by mouth at bedtime as needed for sleep.   Yes [provider]  warfarin (COUMADIN) 5 MG tablet Take 1 tablet (5 mg total) by mouth daily. Patient taking differently: Take 2.5-5 mg by mouth See admin instructions. Take one tablet (5 mg) by mouth on Monday, Wednesday, Friday, take 1/2 tablet (2.5 mg) on Sunday, Tuesday, Thursday, Saturday 01/29/18  Yes Jegede, Olugbemiga E, MD  gabapentin (NEURONTIN) 400 MG capsule Take 1 capsule (400 mg total) 2 (two) times daily by mouth. Patient not taking: Reported on 04/28/2018 09/04/17   Quentin Angst, MD  nitroGLYCERIN (NITROSTAT) 0.4 MG SL tablet Place 1 tablet (0.4 mg total) under the tongue every 5 (five) minutes as needed for chest pain. Patient not taking: Reported on 04/11/2018 05/02/17   Quentin Angst, MD  pantoprazole (PROTONIX) 40 MG tablet Take 1 tablet (40 mg total) by mouth at bedtime. Patient not taking: Reported on 04/28/2018 12/04/17   Quentin Angst, MD  famotidine (PEPCID) 20 MG tablet Take 1 tablet (20 mg total) by mouth 2 (two) times daily. 12/04/11 12/30/11  Grant Fontana, PA-C  TACRINE HYDROCHLORIDE PO Take 25 mg by mouth daily.    12/30/11  [provider]    Physical Exam: Vitals:   05/06/18 0045 05/06/18 0100 05/06/18 0115 05/06/18 0130  BP: (!) 137/97 127/87 (!) 143/96 (!) 144/94  Pulse: 66 70 69 78  Resp: (!) 7 (!) 8 12 (!) 22  Temp:      TempSrc:      SpO2: 100% 100% 99% 97%  Weight:      Height:          Constitutional: NAD, calm  Eyes: PERTLA, lids and conjunctivae  normal ENMT: Mucous membranes are moist. Posterior pharynx clear of any exudate or lesions.   Neck: normal, supple, no masses, no thyromegaly Respiratory: clear to auscultation bilaterally, no wheezing, no crackles. Normal respiratory effort.  Cardiovascular: S1 & S2 heard, regular rate and rhythm. No extremity edema.   Abdomen: Distended but soft, non-tender. Bowel sounds active.  Musculoskeletal: no clubbing / cyanosis. No joint deformity upper and lower extremities. Normal muscle tone.  Skin: no significant rashes, lesions, ulcers. Warm, dry, well-perfused. Neurologic: CN 2-12 grossly intact. Sensation intact, patellar DTR normal. Strength 5/5 in all 4 limbs.  Psychiatric: Sleeping, easily roused and oriented x 3. Calm, cooperative.     Labs on Admission: I have personally reviewed following labs and imaging studies  CBC: Recent Labs  Lab 04/30/18 0245 05/01/18 0603 05/05/18 2014  WBC 5.4 6.1 3.6*  HGB 14.1 13.5 11.8*  HCT 44.1 43.2 38.6*  MCV 93.0 93.9 98.2  PLT 196 200 200   Basic Metabolic Panel: Recent Labs  Lab 04/30/18 0245 05/01/18 0603 05/05/18 2028  NA 139 139 141  K 3.9 3.8 4.8  CL 104 102 112*  CO2 26 25 18*  GLUCOSE 104* 103* 86  BUN 5* 8 24*  CREATININE 1.20 1.35* 1.70*  CALCIUM 9.1 9.0 8.8*   GFR: Estimated Creatinine Clearance: 51.6 mL/min (A) (by C-G formula based on SCr of 1.7 mg/dL (H)). Liver Function Tests: Recent Labs  Lab 04/30/18 0245 05/01/18 0603 05/05/18 2028  AST 26 26 19   ALT 23 27 18   ALKPHOS 44 39 36*  BILITOT 0.5 0.6 0.5  PROT 6.5 6.4* 6.1*  ALBUMIN 3.2* 3.4* 3.4*   Recent Labs  Lab 05/05/18 2028  LIPASE 27   No results for input(s): AMMONIA in the last 168 hours. Coagulation Profile: Recent Labs  Lab 04/30/18 0245 05/01/18 0603 05/05/18 2129  INR 1.40 1.20 1.40   Cardiac Enzymes: Recent Labs  Lab 05/05/18 2028  TROPONINI <0.03   BNP (last 3 results) No results for input(s): PROBNP in the last 8760  hours. HbA1C: No results for input(s): HGBA1C in the last 72 hours. CBG: Recent Labs  Lab 04/30/18 0726 04/30/18 1126 04/30/18 1605 04/30/18 2112 05/01/18 0736  GLUCAP 122* 103* 89 108* 103*   Lipid Profile: No results for input(s): CHOL, HDL, LDLCALC, TRIG, CHOLHDL, LDLDIRECT in the last 72 hours. Thyroid Function Tests: No results for input(s): TSH, T4TOTAL, FREET4, T3FREE, THYROIDAB in the last 72 hours. Anemia Panel: No results for input(s): VITAMINB12, FOLATE, FERRITIN, TIBC, IRON, RETICCTPCT in the last 72 hours. Urine analysis:    Component Value Date/Time   COLORURINE YELLOW 05/05/2018 2030   APPEARANCEUR CLEAR 05/05/2018 2030   LABSPEC 1.025 05/05/2018 2030   PHURINE 5.0 05/05/2018 2030   GLUCOSEU NEGATIVE 05/05/2018 2030   HGBUR NEGATIVE 05/05/2018 2030   BILIRUBINUR NEGATIVE 05/05/2018 2030   KETONESUR NEGATIVE 05/05/2018 2030   PROTEINUR NEGATIVE 05/05/2018 2030   UROBILINOGEN 1.0 02/04/2015 0647   NITRITE NEGATIVE 05/05/2018 2030   LEUKOCYTESUR NEGATIVE 05/05/2018 2030   Sepsis Labs: @LABRCNTIP (procalcitonin:4,lacticidven:4) ) Recent Results (from the past 240 hour(s))  MRSA PCR Screening     Status: None   Collection Time: 04/27/18  6:18 AM  Result Value Ref Range Status   MRSA by PCR NEGATIVE NEGATIVE Final    Comment:        The GeneXpert MRSA Assay (FDA approved for NASAL specimens only), is one component of a comprehensive MRSA colonization surveillance program. It is not intended to diagnose MRSA infection nor to guide or monitor treatment for MRSA infections. Performed at [facility], [address] [place], [place] [address]      Radiological Exams on Admission: Dg Abd 1 View  Result Date: 05/06/2018 CLINICAL DATA:  Abdominal pain and distention. EXAM: ABDOMEN - 1 VIEW COMPARISON:  CT abdomen and pelvis 04/30/2018 FINDINGS: Moderate gaseous distention of the stomach. Nonobstructed, nondistended bowel gas pattern with scattered  air containing small large bowel loops noted. No free air is seen. Lower lumbar facet arthropathy is seen at L4-5 and L5-S1. No acute osseous abnormality. Stable phleboliths right hemipelvis. No organomegaly. IMPRESSION: Apart from moderate gaseous distention of the stomach, the bowel gas pattern is unremarkable. There is no free air. Electronically Signed   By: Tollie Eth M.D.   On: 05/06/2018 02:25   Ct Head Wo Contrast  Result Date: 05/06/2018 CLINICAL DATA:  62 year old male with trauma. EXAM: CT HEAD WITHOUT CONTRAST CT CERVICAL SPINE WITHOUT CONTRAST TECHNIQUE: Multidetector CT imaging of the head and cervical spine was performed following the standard protocol without intravenous contrast. Multiplanar CT image reconstructions of the cervical spine were also generated. COMPARISON:  Head CT dated 04/27/2018 FINDINGS: CT HEAD FINDINGS Brain: There is mild age-related atrophy and chronic microvascular ischemic changes. There is no acute intracranial hemorrhage. No mass effect or midline shift. No extra-axial fluid collection. Vascular: No hyperdense vessel or unexpected calcification. Skull: Normal. Negative for fracture or focal lesion. Sinuses/Orbits: Minimal mucoperiosteal thickening of paranasal sinuses. No air-fluid levels. The mastoid air cells are clear. Other: None CT CERVICAL SPINE FINDINGS Alignment: No acute subluxation. Skull base and vertebrae: No definite acute fracture. Evaluation is however very limited due to streak artifact caused by spinal fixation hardware. Soft tissues and spinal canal: No paraspinal swelling or collection. Evaluation is very limited due to streak artifact. Disc levels: C4-C7 ACDF and C3-T1 posterior fusion and laminectomy. The fixation hardware appears intact and similar to prior CT. Upper chest: Negative. Other: None IMPRESSION: 1. No acute intracranial hemorrhage. 2. No definite acute/traumatic cervical spine pathology. Evaluation however is limited due to streak  artifact caused by metallic fusion hardware. No evidence of hardware fracture. Electronically Signed   By: Elgie Collard M.D.   On: 05/06/2018 00:00   Ct Cervical Spine Wo Contrast  Result Date: 05/06/2018 CLINICAL DATA:  62 year old male with trauma. EXAM: CT HEAD WITHOUT CONTRAST CT CERVICAL SPINE WITHOUT CONTRAST TECHNIQUE: Multidetector CT imaging of the head and cervical spine was performed following the standard protocol without intravenous contrast. Multiplanar CT image reconstructions of the cervical spine were also generated. COMPARISON:  Head CT dated 04/27/2018 FINDINGS: CT HEAD FINDINGS Brain: There is mild age-related atrophy and chronic microvascular ischemic changes. There is no acute intracranial hemorrhage. No mass effect or midline shift. No extra-axial fluid collection. Vascular: No hyperdense vessel or unexpected calcification. Skull: Normal. Negative for fracture or focal lesion. Sinuses/Orbits: Minimal mucoperiosteal thickening of paranasal sinuses. No air-fluid levels. The mastoid air cells are clear. Other: None CT CERVICAL SPINE FINDINGS Alignment: No acute subluxation. Skull base and vertebrae: No definite acute fracture. Evaluation is however very limited due to streak artifact caused by spinal fixation hardware. Soft tissues and spinal canal: No paraspinal swelling or collection. Evaluation is very limited due to streak artifact. Disc levels: C4-C7 ACDF and C3-T1 posterior fusion and laminectomy. The fixation hardware appears intact and similar to prior CT. Upper chest: Negative. Other: None IMPRESSION: 1. No acute intracranial hemorrhage. 2. No definite acute/traumatic cervical spine pathology. Evaluation however is limited due to streak artifact caused by metallic fusion hardware. No evidence of hardware fracture. Electronically Signed   By: Elgie Collard M.D.   On: 05/06/2018 00:00   Dg Chest Portable  1 View  Result Date: 05/05/2018 CLINICAL DATA:  Weakness, shortness of  breath and abdominal pain. Lethargy. EXAM: PORTABLE CHEST 1 VIEW COMPARISON:  05/15/2017. FINDINGS: Chronic LEFT hemidiaphragm elevation. Normal heart size. 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 No consolidation or edema. No effusion or pneumothorax. Previous cervical spine surgery. IMPRESSION: No active disease. Electronically Signed   By: Elsie Stain M.D.   On: 05/05/2018 21:13    EKG: Independently reviewed. Sinus rhythm.   Assessment/Plan  1. Seizure  - Presents with complaints of tremor, states he had a seizure at home  - Seizure-like activity witnessed in ED, self-limiting and followed by a period of somnolence  - He reports hx of seizures, most recently this past Dec or Jan - He reports stopping his Depakote recently; valpoate level is low in ED  - He has become more alert in ED and there are no focal neuro deficits on exam; CT head is negative  - He was started on IV Depakote in ED, has become more alert and will be resumed on oral Depakote  - Continue seizure precautions   2. Acute kidney injury superimposed on CKD stage III  - SCr is 1.70 on admission, up from 1.35 a few days earlier  - Treated with 1 liter NS in ED  - Check urine chemistries, renally-dose medications, hold ARB and NSAID, repeat chem panel in am    3. Leukopenia; normocytic anemia  - WBC is 3,600 on admission and Hgb is 11.8, down from 13.5 on 05/01/18  - No gross bleeding, no infectious s/s  - Check anemia panel, culture if febrile, repeat CBC in am    4. Hx of DVT and PE  - No evidence for acute VTE  - INR is subtherapeutic, will bridge with Lovenox    5. Hx of polysubstance abuse  - Reports no longer using illicit substances  - Reports he has cut back on EtOH use  - Monitor with CIWA and prn Ativan    6. Chronic pain  - Reports chronic neck and back pain on admission  - Continue home regimen with Cymbalta, gabapentin, and prn Percocet   7. Hypertension  - BP at goal  - Hold ARB initially in  light of increased creatinine   8. Depression with anxiety  - Continue Cymbalta, trazodone, and prn Vistaril    DVT prophylaxis: warfain (INR subtherapeutic, bridging with Lovenox)  Code Status: Full  Family Communication: Discussed with patient  Consults called: None  Admission status: Inpatient     Briscoe Deutscher, MD [facility] Pager [phone]  If 7PM-7AM, please contact night-coverage [email or web address] Password [email or web address]  05/06/2018, 2:57 AM

## 2018-05-06 NOTE — Progress Notes (Addendum)
[facility] Progress Note  Subjective: (NO CHARGE, PT ADMITTED AFTER MN).  Pt seen and examined. RN says had a few short < 30 sec seizure episodes after admission to the floor last night. Pt stable now. 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    Vitals:   05/06/18 0539 05/06/18 0600 05/06/18 0700 05/06/18 0800  BP: (!) 143/98 125/81 116/84 122/80  Pulse: 61 64 66 65  Resp: 10 (!) 9 (!) 8 11  Temp: 97.8 F (36.6 C)   97.6 F (36.4 C)  TempSrc:    Oral  SpO2: 100% 100% 100% 100%  Weight: 102.6 kg (226 lb 3.1 oz)     Height: 5\' 11"  (1.803 m)       Inpatient medications: . allopurinol  300 mg Oral Daily  . divalproex  250 mg Oral TID  . DULoxetine  30 mg Oral BID  . enoxaparin (LOVENOX) injection  100 mg Subcutaneous Q12H  . folic acid  1 mg Oral Daily  . gabapentin  300 mg Oral BID  . LORazepam  0-4 mg Oral Q6H   Followed by  . [START ON 05/08/2018] LORazepam  0-4 mg Oral Q12H  . multivitamin with minerals  1 tablet Oral Daily  . pantoprazole  40 mg Oral Daily  . polyvinyl alcohol   Both Eyes BID  . rosuvastatin  40 mg Oral q1800  . sodium chloride flush  3 mL Intravenous Q12H  . thiamine  100 mg Oral Daily   Or  . thiamine  100 mg Intravenous Daily  . warfarin  5 mg Oral Once  . Warfarin - Pharmacist Dosing Inpatient   Does not apply q1800   . sodium chloride 1 mL (05/06/18 0636)   acetaminophen **OR** acetaminophen, hydrOXYzine, LORazepam **OR** LORazepam, LORazepam, meclizine, methocarbamol, ondansetron **OR** ondansetron (ZOFRAN) IV, oxyCODONE-acetaminophen, traZODone  Exam: Constitutional: NAD, calm  Respiratory: clear to auscultation bilaterally, no wheezing, no crackles Cardiovascular:  S1 & S2 heard, regular rate and rhythm. No edema.   Abdomen: Distended but soft, non-tender. Bowel sounds active.  Musculoskeletal: no clubbing / cyanosis. No joint deformity.  Skin: no significant rashes, lesions, ulcers. Warm, dry, well-perfused. Neurologic: CN 2-12 grossly intact. Sensation intact, patellar DTR normal. Strength 5/5 in all 4 limbs.  Psychiatric: easily roused and oriented x 3. Calm, cooperative.      Presentation Summary: Russell Carter is a 62 y.o. male with medical history significant for polysubstance abuse, alcohol abuse, coronary artery disease, depression with anxiety, seizures, hypertension, and recent admission with syncope and cardiac arrhythmia, now presenting to the ED with reports of shortness of breath and tremor.  Patient reported that he has been experiencing pain "all over," mainly in his neck which she described as chronic.  He reported dyspnea at triage, but denied that on admission.  He had reported tremors to the triage nurse, later stating that he had a seizure at home that prompted his presentation.  He denied recent fall, change in vision or hearing, or focal numbness or weakness.  He denied headache, but reported chronic neck pain for which she has had surgery.  Reported that he is no longer using illicit substances, has cut  back on his drinking, and has also stopped some of his prescription medications including Depakote.  ED Course: Upon arrival to the ED, patient was afebrile, saturating well on room air, and with vitals otherwise stable.  EKG featured a sinus rhythm, chest x-ray was negative for acute cardiopulmonary disease, head CT negative for acute finding, cervical spine CT negative for definite acute abnormality but limited due to artifact from prosthesis.  Chemistry panel notable for a creatinine of 1.70, up from 1.35 a few days earlier.  CBC showed normocytic anemia with hemoglobin of 11.8, down from 13.5 for few days earlier, as well as a new  leukopenia with WBC 3600.  INR subtherapeutic at 1.40.  RN witnessed generalized seizure-like activity in the ED that resolved without intervention after a few seconds.  This was followed by somnolence.  Patient remained hemodynamically stable, in no apparent respiratory distress, and was admitted for ongoing evaluation and management.    Home meds:  - losartan 100 qd  - allopurinol 300 qd/ prilosec 20 qd/ protonix 40 qd  - rosuvastatin 40 qd/ sl ntg prn  - neurontin 400 bid/ trazodone 100 hs prn/ percocet q 4h prn/ cymbalta 30 bid  - prns atarax/ advil/ antivert/ robaxin/ reglan  - depakote DR 250 mg tid (hasn't taken for 2 years per pt)  Prior admissions: Patient just admitted here 7/6- 05/01/18 for RUQ abd pain, w/u negative. Underwent heart cath while here which showed patent midRCA stent, no new disease.  Wenckebach AV block per cardiology, no further w/u.  Hx PE on coumadin.  Quit cocaine a while ago.    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 Seizures: brief seizures, one at home, other in ED. Admitted and given 1 dose IV depakote then restarted po depakote at prior dosing level (+hx seizure d/o, pt stopped taking pt said today "2 yrs ago").  - still having brief seizure activity overnight, 2-3 short seizures - CT head negative  - spoke w/ neuro briefly here are rec's >   - can give Ativan for seizures but only if > otherwise not needed   - ^depakote for pt's size to 500 mg bid, if still here 3-5 days check level  - start scheduled ativan 1mg  qid to help w/ etoh w/d, have ordered  2. Acute kidney injury superimposed on CKD stage III  - improving down to 1.42 today - baseline creat 1.0- 1.3  - Treated with 1 liter NS in ED  - holding ARB and nsaids  3. Leukopenia; normocytic anemia  - WBC is 3,600 on admission and Hgb is 11.8,  down from 13.5 on 05/01/18  - No gross bleeding, no infectious s/s  - Check anemia panel, culture if febrile  4. Hx of DVT and PE  - No evidence for acute VTE  - INR subtherapeutic, pharm consulted for coumadin Rx - getting bridge with full-dose SQ Lovenox    5. Hx ETOH abuse  - Reports no longer using illicit substances (cocaine) but is drinking alcohol - Monitor with CIWA protocol - adding scheduled po Ativan 1mg  qid for now  6. Chronic pain / hx C-spine surgery 2018 w/ residual L hemiparesis - Reports chronic neck and back pain on admission  - Continue home regimen with Cymbalta, gabapentin, and prn Percocet   7. Hypertension  - BP well controlled -  Hold ARB initially in light of increased creatinine   8. Depression with anxiety  - Continue Cymbalta, trazodone, and prn Vistaril    DVT prophylaxis: warfain (INR subtherapeutic, bridging with Lovenox)  Code Status: Full  Family Communication: none here Consults called: None  Admission status: Inpatient       Vinson Moselle MD [facility] pgr [phone] 05/06/2018, 10:51 AM   Recent Labs  Lab 05/01/18 0603 05/05/18 2028 05/06/18 0445  NA 139 141 140  K 3.8 4.8 4.4  CL 102 112* 111  CO2 25 18* 25  GLUCOSE 103* 86 111*  BUN 8 24* 21  CREATININE 1.35* 1.70* 1.42*  CALCIUM 9.0 8.8* 8.2*   Recent Labs  Lab 04/30/18 0245 05/01/18 0603 05/05/18 2028  AST 26 26 19   ALT 23 27 18   ALKPHOS 44 39 36*  BILITOT 0.5 0.6 0.5  PROT 6.5 6.4* 6.1*  ALBUMIN 3.2* 3.4* 3.4*   Recent Labs  Lab 05/01/18 0603 05/05/18 2014 05/06/18 0445  WBC 6.1 3.6* 3.1*  HGB 13.5 11.8* 11.6*  HCT 43.2 38.6* 38.4*  MCV 93.9 98.2 98.5  PLT 200 200 179   Iron/TIBC/Ferritin/ %Sat    Component Value Date/Time   IRON 49 05/06/2018 0445   TIBC 304 05/06/2018 0445   FERRITIN 107 05/06/2018 0445   IRONPCTSAT 16 (L) 05/06/2018 0445

## 2018-05-06 NOTE — Progress Notes (Signed)
ANTICOAGULATION CONSULT NOTE - Initial Consult  Pharmacy Consult for lovenox and coumadin Indication: DVT  Allergies  Allergen Reactions  . Shellfish Allergy Anaphylaxis    Patient Measurements: Height: 5\' 11"  (180.3 cm) Weight: 226 lb 3.1 oz (102.6 kg) IBW/kg (Calculated) : 75.3 Heparin Dosing Weight: 92 kg  Vital Signs: Temp: 97.8 F (36.6 C) (07/16 0539) Temp Source: Oral (07/15 2011) BP: 116/84 (07/16 0700) Pulse Rate: 66 (07/16 0700)  Labs: Recent Labs    05/05/18 2014 05/05/18 2028 05/05/18 2129 05/06/18 0445  HGB 11.8*  --   --  11.6*  HCT 38.6*  --   --  38.4*  PLT 200  --   --  179  LABPROT  --   --  17.1*  --   INR  --   --  1.40  --   CREATININE  --  1.70*  --  1.42*  TROPONINI  --  <0.03  --   --     Estimated Creatinine Clearance: 64.9 mL/min (A) (by C-G formula based on SCr of 1.42 mg/dL (H)).   Medical History: Past Medical History:  Diagnosis Date  . Anginal pain (HCC)   . Anxiety   . Arthritis   . Bipolar disorder (HCC)   . Blood in stool   . CAD (coronary artery disease)    a. 2011: cath showing mod non-obstructive disease b. 02/2016: cath with 95% stenosis in the Mid RCA. DES placed.  . Cervical stenosis of spine   . Cocaine abuse (HCC)    history  . Depression   . Diabetes mellitus without complication (HCC)   . Gout, unspecified   . Headache   . HTN (hypertension)   . Hypercholesterolemia   . Hyperglycemia   . Migraines   . Myocardial infarction [facility]) 2008   drug cocaine and marijuana at time of mi none  since    . Pneumonia   . Rectal bleeding   . Renal insufficiency   . Seizures (HCC)   . Stroke [facility])     Medications:  Was taking coumadin and lovenox  Assessment: 62 yo man on Coumadin 2.5mg  daily exc for 5mg  on MWF PTA for hx of DVT and PE. INR is low on admit at 1.4 so bridging with Lovenox. Hgb 11.6, plts wnl.   Goal of Therapy:   Anti-Xa level 0.6-1 units/ml 4hrs after LMWH dose given  INR 2-3 Monitor platelets  by anticoagulation protocol: Yes   Plan:  Continue enoxaparin 100mg  [place] Q12h Coumadin 5mg  this morning Monitor daily INR, CBC, s/s of bleed  Dehaven Sine J 05/06/2018,7:51 AM

## 2018-05-06 NOTE — Discharge Instructions (Addendum)
1)Per [facility] statutes, patients with seizures are not allowed to drive until they have been seizure-free for six months.   Use caution when using heavy equipment or power tools. Avoid working on ladders or at heights. Take showers instead of baths. Ensure the water temperature is not too high on the home water heater. Do not go swimming alone. When caring for infants or small children, sit down when holding, feeding, or changing them to minimize risk of injury to the child in the event you have a seizure.   Do not lock yourself in a room alone (i.e. bathroom).  Maintain good sleep hygiene. Avoid alcohol.   If patienthas another seizure, call 911 and bring them back to the ED if: A. The seizure lasts longer than 5 minutes.  B. The patient doesn't wake shortly after the seizure or has new problems such as difficulty seeing, speaking or moving following the seizure C. The patient was injured during the seizure D. The patient has a temperature over 102 F (39C) E. The patient vomited during the seizure and now is having trouble breathing  2)Avoid ibuprofen/Advil/Aleve/Motrin/Goody Powders/Naproxen/BC powders/Meloxicam/Diclofenac/Indomethacin and other Nonsteroidal anti-inflammatory medications as these will make you more likely to bleed and can cause stomach ulcers, can also cause Kidney problems.   3)Recheck Depakote Level on Friday 05/09/18  4)Recheck PT/INR on Friday 05/09/18  5) take Depakote as prescribed to prevent further seizures  6) take Coumadin [blood thinner] as prescribed to prevent blood clots and strokes  7) follow-up with neurologist as outpatient in 1 to 2 weeks for recheck  8) follow-up with your primary care doctor in 1 to 2 weeks for recheck as well  9)  quit alcohol use  Information on my medicine - Coumadin   (Warfarin)  Why was Coumadin prescribed for you? Coumadin was prescribed for you because you have a blood clot or a medical condition that  can cause an increased risk of forming blood clots. Blood clots can cause serious health problems by blocking the flow of blood to the heart, lung, or brain. Coumadin can prevent harmful blood clots from forming. As a reminder your indication for Coumadin is: History of blood clot  What test will check on my response to Coumadin? While on Coumadin (warfarin) you will need to have an INR test regularly to ensure that your dose is keeping you in the desired range. The INR (international normalized ratio) number is calculated from the result of the laboratory test called prothrombin time (PT).  If an INR APPOINTMENT HAS NOT ALREADY BEEN MADE FOR YOU please schedule an appointment to have this lab work done by your health care provider within 7 days. Your INR goal is usually a number between:  2 to 3 or your provider may give you a more narrow range like 2-2.5.  Ask your health care provider during an office visit what your goal INR is.  What  do you need to  know  About  COUMADIN? Take Coumadin (warfarin) exactly as prescribed by your healthcare provider about the same time each day.  DO NOT stop taking without talking to the doctor who prescribed the medication.  Stopping without other blood clot prevention medication to take the place of Coumadin may increase your risk of developing a new clot or stroke.  Get refills before you run out.  What do you do if you miss a dose? If you miss a dose, take it as soon as you remember on the same day  then continue your regularly scheduled regimen the next day.  Do not take two doses of Coumadin at the same time.  Important Safety Information A possible side effect of Coumadin (Warfarin) is an increased risk of bleeding. You should call your healthcare provider right away if you experience any of the following: ? Bleeding from an injury or your nose that does not stop. ? Unusual colored urine (red or dark brown) or unusual colored stools (red or  black). ? Unusual bruising for unknown reasons. ? A serious fall or if you hit your head (even if there is no bleeding).  Some foods or medicines interact with Coumadin (warfarin) and might alter your response to warfarin. To help avoid this: ? Eat a balanced diet, maintaining a consistent amount of Vitamin K. ? Notify your provider about major diet changes you plan to make. ? Avoid alcohol or limit your intake to 1 drink for women and 2 drinks for men per day. (1 drink is 5 oz. wine, 12 oz. beer, or 1.5 oz. liquor.)  Make sure that ANY health care provider who prescribes medication for you knows that you are taking Coumadin (warfarin).  Also make sure the healthcare provider who is monitoring your Coumadin knows when you have started a new medication including herbals and non-prescription products.  Coumadin (Warfarin)  Major Drug Interactions  Increased Warfarin Effect Decreased Warfarin Effect  Alcohol (large quantities) Antibiotics (esp. Septra/Bactrim, Flagyl, Cipro) Amiodarone (Cordarone) Aspirin (ASA) Cimetidine (Tagamet) Megestrol (Megace) NSAIDs (ibuprofen, naproxen, etc.) Piroxicam (Feldene) Propafenone (Rythmol SR) Propranolol (Inderal) Isoniazid (INH) Posaconazole (Noxafil) Barbiturates (Phenobarbital) Carbamazepine (Tegretol) Chlordiazepoxide (Librium) Cholestyramine (Questran) Griseofulvin Oral Contraceptives Rifampin Sucralfate (Carafate) Vitamin K   Coumadin (Warfarin) Major Herbal Interactions  Increased Warfarin Effect Decreased Warfarin Effect  Garlic Ginseng Ginkgo biloba Coenzyme Q10 Green tea St. Johns wort    Coumadin (Warfarin) FOOD Interactions  Eat a consistent number of servings per week of foods HIGH in Vitamin K (1 serving =  cup)  Collards (cooked, or boiled & drained) Kale (cooked, or boiled & drained) Mustard greens (cooked, or boiled & drained) Parsley *serving size only =  cup Spinach (cooked, or boiled & drained) Swiss chard  (cooked, or boiled & drained) Turnip greens (cooked, or boiled & drained)  Eat a consistent number of servings per week of foods MEDIUM-HIGH in Vitamin K (1 serving = 1 cup)  Asparagus (cooked, or boiled & drained) Broccoli (cooked, boiled & drained, or raw & chopped) Brussel sprouts (cooked, or boiled & drained) *serving size only =  cup Lettuce, raw (green leaf, endive, romaine) Spinach, raw Turnip greens, raw & chopped   These websites have more information on Coumadin (warfarin):  http://www.king-russell.com/; https://www.hines.net/;    1)Per [employer], patients with seizures are not allowed to drive until they have been seizure-free for six months.   Use caution when using heavy equipment or power tools. Avoid working on ladders or at heights. Take showers instead of baths. Ensure the water temperature is not too high on the home water heater. Do not go swimming alone. When caring for infants or small children, sit down when holding, feeding, or changing them to minimize risk of injury to the child in the event you have a seizure.   Do not lock yourself in a room alone (i.e. bathroom).  Maintain good sleep hygiene. Avoid alcohol.   If patienthas another seizure, call 911 and bring them back to the ED if: A. The seizure lasts longer than 5 minutes.  B. The patient doesn't wake shortly after the seizure or has new problems such as difficulty seeing, speaking or moving following the seizure C. The patient was injured during the seizure D. The patient has a temperature over 102 F (39C) E. The patient vomited during the seizure and now is having trouble breathing  2)Avoid ibuprofen/Advil/Aleve/Motrin/Goody Powders/Naproxen/BC powders/Meloxicam/Diclofenac/Indomethacin and other Nonsteroidal anti-inflammatory medications as these will make you more likely to bleed and can cause stomach ulcers, can also cause Kidney problems.   3)Recheck Depakote Level on  Friday 05/09/18  4)Recheck PT/INR on Friday 05/09/18  5) take Depakote as prescribed to prevent further seizures  6) take Coumadin [blood thinner] as prescribed to prevent blood clots and strokes  7) follow-up with neurologist as outpatient in 1 to 2 weeks for recheck  8) follow-up with your primary care doctor in 1 to 2 weeks for recheck as well  9)  quit alcohol use

## 2018-05-06 NOTE — Progress Notes (Signed)
Pt stated drinks 2 (16 oz) of beer per day. Pt's Mother stated pt drinks 3-4 (big cans) of beer per day.

## 2018-05-06 NOTE — Progress Notes (Signed)
ANTICOAGULATION CONSULT NOTE - Initial Consult  Pharmacy Consult for lovenox and coumadin Indication: DVT  Allergies  Allergen Reactions  . Shellfish Allergy Anaphylaxis    Patient Measurements: Height: 5\' 11"  (180.3 cm) Weight: 203 lb (92.1 kg) IBW/kg (Calculated) : 75.3 Heparin Dosing Weight: 92 kg  Vital Signs: Temp: 98 F (36.7 C) (07/15 2011) Temp Source: Oral (07/15 2011) BP: 144/94 (07/16 0130) Pulse Rate: 78 (07/16 0130)  Labs: Recent Labs    05/05/18 2014 05/05/18 2028 05/05/18 2129  HGB 11.8*  --   --   HCT 38.6*  --   --   PLT 200  --   --   LABPROT  --   --  17.1*  INR  --   --  1.40  CREATININE  --  1.70*  --   TROPONINI  --  <0.03  --     Estimated Creatinine Clearance: 51.6 mL/min (A) (by C-G formula based on SCr of 1.7 mg/dL (H)).   Medical History: Past Medical History:  Diagnosis Date  . Anginal pain (HCC)   . Anxiety   . Arthritis   . Bipolar disorder (HCC)   . Blood in stool   . CAD (coronary artery disease)    a. 2011: cath showing mod non-obstructive disease b. 02/2016: cath with 95% stenosis in the Mid RCA. DES placed.  . Cervical stenosis of spine   . Cocaine abuse (HCC)    history  . Depression   . Diabetes mellitus without complication (HCC)   . Gout, unspecified   . Headache   . HTN (hypertension)   . Hypercholesterolemia   . Hyperglycemia   . Migraines   . Myocardial infarction [facility]) 2008   drug cocaine and marijuana at time of mi none  since    . Pneumonia   . Rectal bleeding   . Renal insufficiency   . Seizures (HCC)   . Stroke [facility])     Medications:  Was taking coumadin and lovenox  Assessment: 62 yo man to continue anticoagulation for h/o VTE.  He was discharged from hospital 7/11.  INR is 1.40.   Goal of Therapy:  Anti-Xa level 0.6-1 units/ml 4hrs after LMWH dose given  INR 2-3 Monitor platelets by anticoagulation protocol: Yes   Plan:  Continue lovenox 95 mg sq q12 hours Coumadin 5 mg po today Daily  PT/INR CBC MWF while on lovenox Monitor for bleeding complications  Arlean Thies Poteet 05/06/2018,3:24 AM

## 2018-05-06 NOTE — Progress Notes (Signed)
Patient arrived from 4NICU 29 to 4NP01. Vitals stable, telemetry applied, pt oriented to room/unit. Report given to [facility] RN.

## 2018-05-07 LAB — CBC
HCT: 39.3 % (ref 39.0–52.0)
Hemoglobin: 12 g/dL — ABNORMAL LOW (ref 13.0–17.0)
MCH: 29.3 pg (ref 26.0–34.0)
MCHC: 30.5 g/dL (ref 30.0–36.0)
MCV: 96.1 fL (ref 78.0–100.0)
PLATELETS: 201 10*3/uL (ref 150–400)
RBC: 4.09 MIL/uL — ABNORMAL LOW (ref 4.22–5.81)
RDW: 13.4 % (ref 11.5–15.5)
WBC: 3.4 10*3/uL — AB (ref 4.0–10.5)

## 2018-05-07 LAB — PROTIME-INR
INR: 1.41
PROTHROMBIN TIME: 17.1 s — AB (ref 11.4–15.2)

## 2018-05-07 MED ORDER — LOSARTAN POTASSIUM 100 MG PO TABS: 100.0000 mg | ORAL_TABLET | Freq: Every day | ORAL | 5 refills | Status: DC

## 2018-05-07 MED ORDER — ENOXAPARIN SODIUM 100 MG/ML ~~LOC~~ SOLN: 100.0000 mg | Freq: Once | SUBCUTANEOUS | Status: DC

## 2018-05-07 MED ORDER — MECLIZINE HCL 25 MG PO TABS
25.0000 mg | ORAL_TABLET | Freq: Every day | ORAL | 0 refills | Status: DC | PRN
Start: 2018-05-07 — End: 2018-06-12

## 2018-05-07 MED ORDER — CALCIUM-VITAMIN D 600-400 MG-UNIT PO TABS: 1.0000 | ORAL_TABLET | Freq: Every day | ORAL | 5 refills | Status: AC

## 2018-05-07 MED ORDER — ACETAMINOPHEN 325 MG PO TABS: 650.0000 mg | ORAL_TABLET | Freq: Four times a day (QID) | ORAL | 1 refills | Status: DC | PRN

## 2018-05-07 MED ORDER — GABAPENTIN 400 MG PO CAPS: 400.0000 mg | ORAL_CAPSULE | Freq: Two times a day (BID) | ORAL | 3 refills | Status: DC

## 2018-05-07 MED ORDER — METHOCARBAMOL 750 MG PO TABS: 750.0000 mg | ORAL_TABLET | Freq: Three times a day (TID) | ORAL | 1 refills | Status: DC | PRN

## 2018-05-07 MED ORDER — DIVALPROEX SODIUM 500 MG PO DR TAB: 500.0000 mg | DELAYED_RELEASE_TABLET | Freq: Two times a day (BID) | ORAL | 5 refills | Status: DC

## 2018-05-07 MED ORDER — ROSUVASTATIN CALCIUM 40 MG PO TABS: 40.0000 mg | ORAL_TABLET | Freq: Every day | ORAL | 5 refills | Status: DC

## 2018-05-07 MED ORDER — LORAZEPAM 0.5 MG PO TABS: 0.5000 mg | ORAL_TABLET | Freq: Three times a day (TID) | ORAL | 0 refills | Status: DC | PRN

## 2018-05-07 MED ORDER — TRAZODONE HCL 100 MG PO TABS: 100.0000 mg | ORAL_TABLET | Freq: Every evening | ORAL | 5 refills | Status: DC | PRN

## 2018-05-07 MED ORDER — HYDROXYZINE HCL 25 MG PO TABS: 25.0000 mg | ORAL_TABLET | Freq: Four times a day (QID) | ORAL | 2 refills | Status: DC | PRN

## 2018-05-07 MED ORDER — ADULT MULTIVITAMIN W/MINERALS CH: 1.0000 | ORAL_TABLET | Freq: Every day | ORAL | 5 refills | Status: AC

## 2018-05-07 MED ORDER — WARFARIN SODIUM 5 MG PO TABS: 5.0000 mg | ORAL_TABLET | Freq: Every day | ORAL | 3 refills | Status: DC

## 2018-05-07 MED ORDER — DOCUSATE SODIUM 100 MG PO CAPS
200.0000 mg | ORAL_CAPSULE | Freq: Every day | ORAL | Status: DC
  Administered 2018-05-07: 200 mg via ORAL
  Filled 2018-05-07: qty 2

## 2018-05-07 MED ORDER — ONDANSETRON HCL 4 MG PO TABS: 4.0000 mg | ORAL_TABLET | Freq: Four times a day (QID) | ORAL | 0 refills | Status: DC | PRN

## 2018-05-07 MED ORDER — BISACODYL 10 MG RE SUPP
10.0000 mg | Freq: Every day | RECTAL | Status: DC | PRN
  Administered 2018-05-07: 10 mg via RECTAL
  Filled 2018-05-07: qty 1

## 2018-05-07 MED ORDER — FOLIC ACID 1 MG PO TABS: 1.0000 mg | ORAL_TABLET | Freq: Every day | ORAL | 5 refills | Status: AC

## 2018-05-07 MED ORDER — DULOXETINE HCL 30 MG PO CPEP: 30.0000 mg | ORAL_CAPSULE | Freq: Two times a day (BID) | ORAL | 5 refills | Status: DC

## 2018-05-07 NOTE — Progress Notes (Signed)
Patient and wife given discharge instructions and prescriptions.  Information reviewed, no additional questions from patient or spouse.  IV removed, no equipment to send, patient taken to car via wheelchair.

## 2018-05-07 NOTE — Discharge Summary (Signed)
Russell Carter, is a 62 y.o. male  DOB Sep 25, 1956  MRN [id number].  Admission date:  05/05/2018  Admitting Physician  Briscoe Deutscher, MD  Discharge Date:  05/07/2018   Primary MD  Marcine Matar, MD  Recommendations for primary care physician for things to follow:   1)Per [facility] statutes, patients with seizures are not allowed to drive until they have been seizure-free for six months.   Use caution when using heavy equipment or power tools. Avoid working on ladders or at heights. Take showers instead of baths. Ensure the water temperature is not too high on the home water heater. Do not go swimming alone. When caring for infants or small children, sit down when holding, feeding, or changing them to minimize risk of injury to the child in the event you have a seizure.   Do not lock yourself in a room alone (i.e. bathroom).  Maintain good sleep hygiene. Avoid alcohol.   If patienthas another seizure, call 911 and bring them back to the ED if: A. The seizure lasts longer than 5 minutes.  B. The patient doesn't wake shortly after the seizure or has new problems such as difficulty seeing, speaking or moving following the seizure C. The patient was injured during the seizure D. The patient has a temperature over 102 F (39C) E. The patient vomited during the seizure and now is having trouble breathing  2)Avoid ibuprofen/Advil/Aleve/Motrin/Goody Powders/Naproxen/BC powders/Meloxicam/Diclofenac/Indomethacin and other Nonsteroidal anti-inflammatory medications as these will make you more likely to bleed and can cause stomach ulcers, can also cause Kidney problems.   3)Recheck Depakote Level on Friday 05/09/18  4)Recheck PT/INR on Friday 05/09/18  5) take Depakote as prescribed to prevent further seizures  6) take Coumadin [blood thinner] as prescribed to prevent blood clots and strokes  7)  follow-up with neurologist as outpatient in 1 to 2 weeks for recheck  8) follow-up with your primary care doctor in 1 to 2 weeks for recheck as well  9)  quit alcohol use   Admission Diagnosis  Abdominal distension [R14.0] Seizure (HCC) [R56.9] Lethargy [R53.83] AKI (acute kidney injury) (HCC) [N17.9]   Discharge Diagnosis  Abdominal distension [R14.0] Seizure (HCC) [R56.9] Lethargy [R53.83] AKI (acute kidney injury) (HCC) [N17.9]    Principal Problem:   Seizure (HCC) Active Problems:   Essential hypertension   Polysubstance abuse (HCC)   History of CVA (cerebrovascular accident)   Diabetes mellitus type 2 in nonobese (HCC)   History of pulmonary embolus (PE)   Chronic pain disorder   Acute kidney injury (HCC)   Seizures (HCC)   CKD (chronic kidney disease), stage III (HCC)   Normocytic anemia   Leukopenia      Past Medical History:  Diagnosis Date  . Anginal pain (HCC)   . Anxiety   . Arthritis   . Bipolar disorder (HCC)   . Blood in stool   . CAD (coronary artery disease)    a. 2011: cath showing mod non-obstructive disease b. 02/2016: cath with 95% stenosis  in the Mid RCA. DES placed.  . Cervical stenosis of spine   . Cocaine abuse (HCC)    history  . Depression   . Diabetes mellitus without complication (HCC)   . Gout, unspecified   . Headache   . HTN (hypertension)   . Hypercholesterolemia   . Hyperglycemia   . Migraines   . Myocardial infarction [facility]) 2008   drug cocaine and marijuana at time of mi none  since    . Pneumonia   . Rectal bleeding   . Renal insufficiency   . Seizures (HCC)   . Stroke [facility])     Past Surgical History:  Procedure Laterality Date  . ANTERIOR CERVICAL DECOMP/DISCECTOMY FUSION N/A 01/24/2017   Procedure: Cervical Four-Five,Cervical Five Six,Cervical Six-Seven Anterior cervical discectomy with fusion and plate with Cervical Three-Thoracic- One Laminectomy/Foraminotomy with Cervical Three-Thoracic- One Fixation and fusion;   Surgeon: Loura Halt Ditty, MD;  Location: [facility] OR;  Service: Neurosurgery;  Laterality: N/A;  Part-1 anterior approach  . CARDIAC CATHETERIZATION  2008; ~ 2011   [place]-40%lad  . CARDIAC CATHETERIZATION N/A 03/16/2016   Procedure: Left Heart Cath and Coronary Angiography;  Surgeon: Marykay Lex, MD;  Location: [facility] INVASIVE CV LAB;  Service: Cardiovascular;  Laterality: N/A;  . COLONOSCOPY WITH PROPOFOL N/A 04/17/2018   Procedure: COLONOSCOPY WITH PROPOFOL;  Surgeon: Graylin Shiver, MD;  Location: [facility] ENDOSCOPY;  Service: Endoscopy;  Laterality: N/A;  . CORONARY ANGIOPLASTY     STENTS  . EYE SURGERY Bilateral    "for double vision"  . INGUINAL HERNIA REPAIR  05/12/2012   Procedure: HERNIA REPAIR INGUINAL ADULT;  Surgeon: Liz Malady, MD;  Location: [facility];  Service: General;  Laterality: Right;  . INGUINAL HERNIA REPAIR Left 02/02/2003   Dr. Violeta Gelinas. repair with mesh. Same procedure done again in  05/22/2004   . INGUINAL HERNIA REPAIR Left ?date 2nd OR  . LEFT HEART CATH AND CORONARY ANGIOGRAPHY N/A 04/29/2018   Procedure: LEFT HEART CATH AND CORONARY ANGIOGRAPHY;  Surgeon: Swaz[place], Peter M, MD;  Location: [facility] INVASIVE CV LAB;  Service: Cardiovascular;  Laterality: N/A;  . POSTERIOR CERVICAL FUSION/FORAMINOTOMY N/A 01/24/2017   Procedure: Cervical Three-Thoracic One-Laminectomy/Foraminotomy with Cervical Three-Thoracic One Fixation and fusion;  Surgeon: Loura Halt Ditty, MD;  Location: [facility] OR;  Service: Neurosurgery;  Laterality: N/A;  Part-2 Posterior approach  . RECONSTRUCT / STABILIZE DISTAL ULNA    . SHOULDER OPEN ROTATOR CUFF REPAIR Left 2010  . SHOULDER SURGERY Left    "injured on the job; had to cut small bone out"  . THUMB FUSION Right        HPI  from the history and physical done on the day of admission:    HPI: Russell Carter is a 62 y.o. male with medical history significant for polysubstance abuse, alcohol abuse, coronary artery disease, depression  with anxiety, seizures, hypertension, and recent admission with syncope and cardiac arrhythmia, now presenting to the ED with reports of shortness of breath and tremor.  Patient reports that he has been experiencing pain "all over," mainly in his neck which she describes as chronic.  He reported dyspnea at triage, but denies that on admission.  He had reported tremors to the triage nurse, later stating that he had a seizure at home that prompted his presentation.  He denies recent fall, change in vision or hearing, or focal numbness or weakness.  He denies headache, but reports chronic neck pain for which she has had surgery.  Reports that he is no longer using illicit substances, has cut back on his drinking, and has also stopped some of his prescription medications including Depakote.  ED Course: Upon arrival to the ED, patient is found to be afebrile, saturating well on room air, and with vitals otherwise stable.  EKG features a sinus rhythm, chest x-ray is negative for acute cardiopulmonary disease, head CT is negative for acute finding, cervical spine CT is negative for definite acute abnormality but limited due to artifact from prosthesis.  Chemistry panel is notable for a creatinine of 1.70, up from 1.35 a few days earlier.  CBC features a normocytic anemia with hemoglobin of 11.8, down from 13.5 for few days earlier, as well as a new leukopenia with WBC 3600.  INR subtherapeutic at 1.40.  RN witnessed generalized seizure-like activity in the ED that resolved without intervention after a few seconds.  This was followed by somnolence.  Patient has remained hemodynamically stable, in no apparent respiratory distress, has become more alert and is fully oriented, and will be admitted for ongoing evaluation and management.    Hospital Course:      1)Seizure disorder--no further seizures, patient eager to go home, is not back to baseline, discussed with on-call neurologist Dr. Wilford Corner, okay to discharge  home with outpatient follow-up with his primary neurologist in 1 to 2 weeks, repeat Depakote levels on 05/09/2018, continue Depakote, driving and other state law mandated limitations regarding seizures discussed with patient see AVS given to patient with instructions  2) DVT/PE--continue Coumadin as advised recheck INR team 2019  3) history of alcohol abuse--- no DTs at this time, alcohol cessation strongly encouraged,  4) chronic pain syndrome/history of prior C-spine surgery in 2018 with residual left-sided hemiparesis --- stable at this time appears to be at baseline, continue home regimen Cymbalta, gabapentin, methocarbamol and oxycodone  Discharge Condition: stable  Follow UP--neurologist in 1 to 2 weeks, and outpatient recheck of Depakote levels and INR on 05/09/2018   Consults obtained - Phone consult with neurologist Dr. Wilford Corner  Diet and Activity recommendation:  As advised  Discharge Instructions    Discharge Instructions    Call MD for:  difficulty breathing, headache or visual disturbances   Complete by:  As directed    Call MD for:  persistant dizziness or light-headedness   Complete by:  As directed    Call MD for:  persistant nausea and vomiting   Complete by:  As directed    Call MD for:  redness, tenderness, or signs of infection (pain, swelling, redness, odor or green/yellow discharge around incision site)   Complete by:  As directed    Call MD for:  severe uncontrolled pain   Complete by:  As directed    Call MD for:  temperature >100.4   Complete by:  As directed    Diet - low sodium heart healthy   Complete by:  As directed    Discharge instructions   Complete by:  As directed    1)Per [facility] statutes, patients with seizures are not allowed to drive until they have been seizure-free for six months.   Use caution when using heavy equipment or power tools. Avoid working on ladders or at heights. Take showers instead of baths. Ensure the water  temperature is not too high on the home water heater. Do not go swimming alone. When caring for infants or small children, sit down when holding, feeding, or changing them to minimize risk of injury to  the child in the event you have a seizure.   Do not lock yourself in a room alone (i.e. bathroom).  Maintain good sleep hygiene. Avoid alcohol.   If patienthas another seizure, call 911 and bring them back to the ED if: A. The seizure lasts longer than 5 minutes.  B. The patient doesn't wake shortly after the seizure or has new problems such as difficulty seeing, speaking or moving following the seizure C. The patient was injured during the seizure D. The patient has a temperature over 102 F (39C) E. The patient vomited during the seizure and now is having trouble breathing  2)Avoid ibuprofen/Advil/Aleve/Motrin/Goody Powders/Naproxen/BC powders/Meloxicam/Diclofenac/Indomethacin and other Nonsteroidal anti-inflammatory medications as these will make you more likely to bleed and can cause stomach ulcers, can also cause Kidney problems.   3)Recheck Depakote Level on Friday 05/09/18  4)Recheck PT/INR on Friday 05/09/18  5) take Depakote as prescribed to prevent further seizures  6) take Coumadin [blood thinner] as prescribed to prevent blood clots and strokes  7) follow-up with neurologist as outpatient in 1 to 2 weeks for recheck  8) follow-up with your primary care doctor in 1 to 2 weeks for recheck as well  9)  quit alcohol use  Please note  You were cared for by a hospitalist Physician during your hospital stay. Once you are discharged, your primary care physician will handle any further medical issues. Please note that NO REFILLS for any discharge medications will be authorized once you are discharged, as it is imperative that you return to your primary care physician (or establish a relationship with a primary care physician if you do not have one) for your aftercare needs so  that they can reassess your need for medications and monitor your lab values   Increase activity slowly   Complete by:  As directed    Use your walker to prevent falls        Discharge Medications     Allergies as of 05/07/2018      Reactions   Shellfish Allergy Anaphylaxis      Medication List    STOP taking these medications   enoxaparin 100 MG/ML injection Commonly known as:  LOVENOX   ibuprofen 600 MG tablet Commonly known as:  ADVIL,MOTRIN     TAKE these medications   acetaminophen 500 MG tablet Commonly known as:  TYLENOL Take 1,000 mg by mouth every 6 (six) hours as needed for headache (pain). What changed:  Another medication with the same name was added. Make sure you understand how and when to take each.   acetaminophen 325 MG tablet Commonly known as:  TYLENOL Take 2 tablets (650 mg total) by mouth every 6 (six) hours as needed for mild pain (or Fever >/= 101). What changed:  You were already taking a medication with the same name, and this prescription was added. Make sure you understand how and when to take each.   allopurinol 300 MG tablet Commonly known as:  ZYLOPRIM Take 1 tablet (300 mg total) by mouth daily.   Calcium-Vitamin D 600-400 MG-UNIT Tabs Take 1 tablet by mouth daily. What changed:  medication strength   clotrimazole 1 % cream Commonly known as:  LOTRIMIN Apply 1 application topically 2 (two) times daily.   divalproex 500 MG DR tablet Commonly known as:  DEPAKOTE Take 1 tablet (500 mg total) by mouth 2 (two) times daily. What changed:    medication strength  how much to take  when to take this  DULoxetine 30 MG capsule Commonly known as:  CYMBALTA Take 1 capsule (30 mg total) by mouth 2 (two) times daily.   folic acid 1 MG tablet Commonly known as:  FOLVITE Take 1 tablet (1 mg total) by mouth daily.   gabapentin 400 MG capsule Commonly known as:  NEURONTIN Take 1 capsule (400 mg total) by mouth 2 (two) times  daily. What changed:  Another medication with the same name was removed. Continue taking this medication, and follow the directions you see here.   hydrOXYzine 25 MG tablet Commonly known as:  ATARAX/VISTARIL Take 1 tablet (25 mg total) by mouth every 6 (six) hours as needed. for anxiety   LORazepam 0.5 MG tablet Commonly known as:  ATIVAN Take 1 tablet (0.5 mg total) by mouth every 8 (eight) hours as needed for anxiety.   losartan 100 MG tablet Commonly known as:  COZAAR Take 1 tablet (100 mg total) by mouth daily.   meclizine 25 MG tablet Commonly known as:  ANTIVERT Take 1 tablet (25 mg total) by mouth daily as needed for dizziness or nausea (vertigo). What changed:    how much to take  how to take this  when to take this  reasons to take this  additional instructions   methocarbamol 750 MG tablet Commonly known as:  ROBAXIN Take 1 tablet (750 mg total) by mouth every 8 (eight) hours as needed for muscle spasms. What changed:  See the new instructions.   metoCLOPramide 10 MG tablet Commonly known as:  REGLAN TAKE 1 TABLET BY MOUTH EVERY 6 HOURS AS NEEDED FOR NAUSEA OR HEADACHE.   multivitamin with minerals Tabs tablet Take 1 tablet by mouth daily.   nitroGLYCERIN 0.4 MG SL tablet Commonly known as:  NITROSTAT Place 1 tablet (0.4 mg total) under the tongue every 5 (five) minutes as needed for chest pain.   omeprazole 20 MG capsule Commonly known as:  PRILOSEC Take 20 mg by mouth daily.   ondansetron 4 MG tablet Commonly known as:  ZOFRAN Take 1 tablet (4 mg total) by mouth every 6 (six) hours as needed for nausea.   oxyCODONE-acetaminophen 7.5-325 MG tablet Commonly known as:  PERCOCET Take 1 tablet by mouth every 4 (four) hours as needed for severe pain.   pantoprazole 40 MG tablet Commonly known as:  PROTONIX Take 1 tablet (40 mg total) by mouth at bedtime.   rosuvastatin 40 MG tablet Commonly known as:  CRESTOR Take 1 tablet (40 mg total) by mouth  daily.   SYSTANE OP Place 1 drop into both eyes 2 (two) times daily.   traZODone 100 MG tablet Commonly known as:  DESYREL Take 1 tablet (100 mg total) by mouth at bedtime as needed for sleep.   warfarin 5 MG tablet Commonly known as:  COUMADIN Take as directed. If you are unsure how to take this medication, talk to your nurse or doctor. Original instructions:  Take 1 tablet (5 mg total) by mouth daily. What changed:    how much to take  when to take this  additional instructions       Major procedures and Radiology Reports - PLEASE review detailed and final reports for all details, in brief -    Ct Abdomen Pelvis Wo Contrast  Result Date: 04/27/2018 CLINICAL DATA:  Acute generalized abdominal pain. EXAM: CT ABDOMEN AND PELVIS WITHOUT CONTRAST TECHNIQUE: Multidetector CT imaging of the abdomen and pelvis was performed following the standard protocol without IV contrast. COMPARISON:  03/13/2017 FINDINGS: Lower  chest: Atelectasis in the lung bases. Coronary artery calcifications. Hepatobiliary: No focal liver abnormality is seen. No gallstones, gallbladder wall thickening, or biliary dilatation. Pancreas: Unremarkable. No pancreatic ductal dilatation or surrounding inflammatory changes. Spleen: Normal in size without focal abnormality. Adrenals/Urinary Tract: Adrenal glands are unremarkable. Kidneys are normal, without renal calculi, focal lesion, or hydronephrosis. Bladder is unremarkable. Stomach/Bowel: Stomach, small bowel, and colon are not abnormally distended. No wall thickening is appreciated although under distention limits evaluation. No inflammatory infiltration. Scattered diverticula in the colon. No evidence of diverticulitis. Appendix is normal. Vascular/Lymphatic: No significant vascular findings are present. No enlarged abdominal or pelvic lymph nodes. Reproductive: Prostate gland is not enlarged. Other: No free air or free fluid in the abdomen. Abdominal wall musculature  appears intact. Postoperative changes in the left inguinal region suggesting previous hernia repair. There patchy areas of infiltration and emphysema in the subcutaneous fat of the anterior abdominal wall. These likely represent subcutaneous injection sites. Surface contusion or laceration could also have this appearance in the appropriate clinical setting. Musculoskeletal: Degenerative changes in the lumbar spine. No destructive bone lesions. IMPRESSION: 1. No evidence of bowel obstruction or inflammation. 2. Atelectasis in the lung bases. 3. Coronary artery calcifications. 4. Patchy areas of infiltration and emphysema in the subcutaneous fat of the anterior abdominal wall, likely representing subcutaneous injection sites. Electronically Signed   By: Burman Nieves M.D.   On: 04/27/2018 01:30   Dg Abd 1 View  Result Date: 05/06/2018 CLINICAL DATA:  Abdominal pain and distention. EXAM: ABDOMEN - 1 VIEW COMPARISON:  CT abdomen and pelvis 04/30/2018 FINDINGS: Moderate gaseous distention of the stomach. Nonobstructed, nondistended bowel gas pattern with scattered air containing small large bowel loops noted. No free air is seen. Lower lumbar facet arthropathy is seen at L4-5 and L5-S1. No acute osseous abnormality. Stable phleboliths right hemipelvis. No organomegaly. IMPRESSION: Apart from moderate gaseous distention of the stomach, the bowel gas pattern is unremarkable. There is no free air. Electronically Signed   By: Tollie Eth M.D.   On: 05/06/2018 02:25   Ct Head Wo Contrast  Result Date: 05/06/2018 CLINICAL DATA:  62 year old male with trauma. EXAM: CT HEAD WITHOUT CONTRAST CT CERVICAL SPINE WITHOUT CONTRAST TECHNIQUE: Multidetector CT imaging of the head and cervical spine was performed following the standard protocol without intravenous contrast. Multiplanar CT image reconstructions of the cervical spine were also generated. COMPARISON:  Head CT dated 04/27/2018 FINDINGS: CT HEAD FINDINGS Brain:  There is mild age-related atrophy and chronic microvascular ischemic changes. There is no acute intracranial hemorrhage. No mass effect or midline shift. No extra-axial fluid collection. Vascular: No hyperdense vessel or unexpected calcification. Skull: Normal. Negative for fracture or focal lesion. Sinuses/Orbits: Minimal mucoperiosteal thickening of paranasal sinuses. No air-fluid levels. The mastoid air cells are clear. Other: None CT CERVICAL SPINE FINDINGS Alignment: No acute subluxation. Skull base and vertebrae: No definite acute fracture. Evaluation is however very limited due to streak artifact caused by spinal fixation hardware. Soft tissues and spinal canal: No paraspinal swelling or collection. Evaluation is very limited due to streak artifact. Disc levels: C4-C7 ACDF and C3-T1 posterior fusion and laminectomy. The fixation hardware appears intact and similar to prior CT. Upper chest: Negative. Other: None IMPRESSION: 1. No acute intracranial hemorrhage. 2. No definite acute/traumatic cervical spine pathology. Evaluation however is limited due to streak artifact caused by metallic fusion hardware. No evidence of hardware fracture. Electronically Signed   By: Elgie Collard M.D.   On: 05/06/2018 00:00  Ct Head Wo Contrast  Result Date: 04/27/2018 CLINICAL DATA:  Altered level of consciousness. EXAM: CT HEAD WITHOUT CONTRAST TECHNIQUE: Contiguous axial images were obtained from the base of the skull through the vertex without intravenous contrast. COMPARISON:  CT head 07/31/2017.  MRI brain 05/15/2017. FINDINGS: Brain: No evidence of acute infarction, hemorrhage, hydrocephalus, extra-axial collection or mass lesion/mass effect. Mild diffuse cerebral atrophy. Vascular: No hyperdense vessel or unexpected calcification. Skull: Normal. Negative for fracture or focal lesion. Sinuses/Orbits: No acute finding. Other: None. IMPRESSION: No acute intracranial abnormalities.  Mild cerebral atrophy.  Electronically Signed   By: Burman Nieves M.D.   On: 04/27/2018 01:25   Ct Cervical Spine Wo Contrast  Result Date: 05/06/2018 CLINICAL DATA:  62 year old male with trauma. EXAM: CT HEAD WITHOUT CONTRAST CT CERVICAL SPINE WITHOUT CONTRAST TECHNIQUE: Multidetector CT imaging of the head and cervical spine was performed following the standard protocol without intravenous contrast. Multiplanar CT image reconstructions of the cervical spine were also generated. COMPARISON:  Head CT dated 04/27/2018 FINDINGS: CT HEAD FINDINGS Brain: There is mild age-related atrophy and chronic microvascular ischemic changes. There is no acute intracranial hemorrhage. No mass effect or midline shift. No extra-axial fluid collection. Vascular: No hyperdense vessel or unexpected calcification. Skull: Normal. Negative for fracture or focal lesion. Sinuses/Orbits: Minimal mucoperiosteal thickening of paranasal sinuses. No air-fluid levels. The mastoid air cells are clear. Other: None CT CERVICAL SPINE FINDINGS Alignment: No acute subluxation. Skull base and vertebrae: No definite acute fracture. Evaluation is however very limited due to streak artifact caused by spinal fixation hardware. Soft tissues and spinal canal: No paraspinal swelling or collection. Evaluation is very limited due to streak artifact. Disc levels: C4-C7 ACDF and C3-T1 posterior fusion and laminectomy. The fixation hardware appears intact and similar to prior CT. Upper chest: Negative. Other: None IMPRESSION: 1. No acute intracranial hemorrhage. 2. No definite acute/traumatic cervical spine pathology. Evaluation however is limited due to streak artifact caused by metallic fusion hardware. No evidence of hardware fracture. Electronically Signed   By: Elgie Collard M.D.   On: 05/06/2018 00:00   Ct Abdomen Pelvis W Contrast  Result Date: 04/30/2018 CLINICAL DATA:  Abdominal pain and bradycardia. Worsening chronic kidney disease with metabolic acidosis. EXAM: CT  ABDOMEN AND PELVIS WITH CONTRAST TECHNIQUE: Multidetector CT imaging of the abdomen and pelvis was performed using the standard protocol following bolus administration of intravenous contrast. CONTRAST:  OMNIPAQUE IOHEXOL 300 MG/ML  SOLN COMPARISON:  Noncontrast CT 04/27/2018. FINDINGS: Lower chest: Mild atelectasis at both lung bases, similar to previous study. There is trace pleural fluid on the left. There is no pericardial effusion. Coronary artery calcifications and possible aortic valvular calcifications are noted. Hepatobiliary: The liver is normal in density without focal abnormality. No evidence of gallstones, gallbladder wall thickening or biliary dilatation. Pancreas: Unremarkable. No pancreatic ductal dilatation or surrounding inflammatory changes. Spleen: Normal in size without focal abnormality. Adrenals/Urinary Tract: Both adrenal glands appear normal. There are small cysts in the lower pole of the right kidney. No evidence of renal mass, urinary tract calculus or hydronephrosis. No significant bladder findings. Stomach/Bowel: No evidence of bowel wall thickening, distention or surrounding inflammatory change. The appendix appears normal. Vascular/Lymphatic: There are no enlarged abdominal or pelvic lymph nodes. Small inguinal lymph nodes are stable. Minimal iliac atherosclerosis without acute vascular findings. Reproductive: The prostate gland appears stable. Other: Interval mild left hemidiaphragm elevation. There are stable sequela of probable subcutaneous injections within the subcutaneous fat of the anterior abdominal wall and  stable probable postsurgical changes in the left inguinal region. No recurrent hernia seen. Musculoskeletal: No acute or significant osseous findings. Prominent facet hypertrophy in the lower lumbar spine. IMPRESSION: 1. No acute findings or explanation for the patient's symptoms. 2. Interval increased left hemidiaphragm elevation with similar atelectasis at both lung  bases. 3. Stable findings in the subcutaneous fat of the anterior abdominal wall, attributed to subcutaneous injections. 4. Coronary artery atherosclerosis. Electronically Signed   By: Carey Bullocks M.D.   On: 04/30/2018 11:32   Nm Hepato W/eject Fract  Result Date: 04/28/2018 CLINICAL DATA:  RIGHT upper quadrant pain question cholecystitis EXAM: NUCLEAR MEDICINE HEPATOBILIARY IMAGING WITH GALLBLADDER EF TECHNIQUE: Sequential images of the abdomen were obtained out to 60 minutes following intravenous administration of radiopharmaceutical. After oral ingestion of Ensure, gallbladder ejection fraction was determined. At 60 min, normal ejection fraction is greater than 33%. RADIOPHARMACEUTICALS:  5.0 mCi Tc-56m  Choletec IV COMPARISON:  None FINDINGS: Normal tracer extraction from bloodstream indicating normal hepatocellular function. Normal excretion of tracer into biliary tree. Gallbladder visualized at 10 min. Small bowel visualized at 52 min. No hepatic retention of tracer. Subjectively normal emptying of tracer from gallbladder following fatty meal stimulation. Calculated gallbladder ejection fraction is 46%, normal. Patient reported no symptoms following Ensure ingestion. Normal gallbladder ejection fraction following Ensure ingestion is greater than 33% at 1 hour. IMPRESSION: Patent biliary tree. Normal gallbladder response to fatty meal stimulation with a normal gallbladder ejection fraction of 46%. Electronically Signed   By: Ulyses Southward M.D.   On: 04/28/2018 19:10   Dg Chest Portable 1 View  Result Date: 05/05/2018 CLINICAL DATA:  Weakness, shortness of breath and abdominal pain. Lethargy. EXAM: PORTABLE CHEST 1 VIEW COMPARISON:  05/15/2017. FINDINGS: Chronic LEFT hemidiaphragm elevation. Normal heart size. 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 No consolidation or edema. No effusion or pneumothorax. Previous cervical spine surgery. IMPRESSION: No active disease. Electronically Signed   By: Elsie Stain M.D.   On: 05/05/2018 21:13   Dg Chest Portable 1 View  Result Date: 04/26/2018 CLINICAL DATA:  Unresponsive, abdominal and leg pain EXAM: PORTABLE CHEST 1 VIEW COMPARISON:  Portable exam 2331 hours compared to 03/12/2017 FINDINGS: External pacing leads project over chest. Normal heart size, mediastinal contours, and pulmonary vascularity. Low lung volumes with bibasilar atelectasis. No definite infiltrate, pleural effusion or pneumothorax. Cervical spine fusion. IMPRESSION: Low lung volumes with bibasilar atelectasis. Electronically Signed   By: Ulyses Southward M.D.   On: 04/26/2018 23:43   Dg Abd Portable 1v  Result Date: 04/28/2018 CLINICAL DATA:  62 year old male with a history of abdominal pain EXAM: PORTABLE ABDOMEN - 1 VIEW COMPARISON:  04/27/2018 FINDINGS: Interval removal of the gastric tube. Interval removal of defibrillator pads. Gas within stomach, small bowel, colon. No abnormal distention. Gas extends to the rectum. No unexpected radiopaque foreign body. No unexpected soft tissue density. No unexpected calcification. No acute displaced fracture IMPRESSION: Nonobstructive bowel gas pattern. Electronically Signed   By: Gilmer Mor D.O.   On: 04/28/2018 08:43   Dg Abd Portable 1 View  Result Date: 04/27/2018 CLINICAL DATA:  NG tube placement EXAM: PORTABLE ABDOMEN - 1 VIEW COMPARISON:  04/26/2018 FINDINGS: Elevation of the left diaphragm with atelectasis at the left base. Esophageal tube tip overlies the gastric body. Mild diffuse increased bowel gas. IMPRESSION: Esophageal tube tip overlies the gastric body Electronically Signed   By: Jasmine Pang M.D.   On: 04/27/2018 03:15   Dg Abd Portable 1 View  Result  Date: 04/26/2018 CLINICAL DATA:  Abdominal pain, unresponsive EXAM: PORTABLE ABDOMEN - 1 VIEW COMPARISON:  Portable exam 2331 hours compared to CT abdomen and pelvis of 03/13/2017 FINDINGS: Gaseous distention of stomach. Large and small bowel gas pattern normal. No bowel dilatation  or bowel wall thickening. Degenerative changes at lower lumbar spine. IMPRESSION: Gaseous distention of stomach. Electronically Signed   By: Ulyses Southward M.D.   On: 04/26/2018 23:44   US Abdomen Limited Ruq  Result Date: 04/28/2018 CLINICAL DATA:  RIGHT upper quadrant pain. EXAM: ULTRASOUND ABDOMEN LIMITED RIGHT UPPER QUADRANT COMPARISON:  Hepatobiliary scan April 28, 2018 and CT abdomen and pelvis April 27, 2018. FINDINGS: Gallbladder: No gallstones or wall thickening visualized. No sonographic Murphy sign noted by sonographer. Common bile duct: Diameter: 3 mm. Liver: No focal lesion identified. Within normal limits in parenchymal echogenicity. Portal vein is patent on color Doppler imaging with normal direction of blood flow towards the liver. IMPRESSION: Negative RIGHT upper quadrant ultrasound. Electronically Signed   By: Awilda Metro M.D.   On: 04/28/2018 21:47    Micro Results   Recent Results (from the past 240 hour(s))  MRSA PCR Screening     Status: None   Collection Time: 05/06/18  5:35 AM  Result Value Ref Range Status   MRSA by PCR NEGATIVE NEGATIVE Final    Comment:        The GeneXpert MRSA Assay (FDA approved for NASAL specimens only), is one component of a comprehensive MRSA colonization surveillance program. It is not intended to diagnose MRSA infection nor to guide or monitor treatment for MRSA infections. Performed at [facility], [address], [place], [place] [address]        Today   Subjective    Russell Carter today has no new concerns, eager to go home, no further seizures, no DT type symptoms          Patient has been seen and examined prior to discharge   Objective   Blood pressure (!) 136/91, pulse 71, temperature 98.1 F (36.7 C), temperature source Oral, resp. rate 14, height 5\' 11"  (1.803 m), weight 101.9 kg (224 lb 10.4 oz), SpO2 98 %.   Intake/Output Summary (Last 24 hours) at 05/07/2018 0959 Last data filed at 05/07/2018 0620 Gross  per 24 hour  Intake 560.98 ml  Output 2200 ml  Net -1639.02 ml    Exam Gen:- Awake Alert,  In no apparent distress  HEENT:- [place].AT, No sclera icterus Neck-Supple Neck,No JVD,.  Lungs-  CTAB , good air movement CV- S1, S2 normal Abd-  +ve B.Sounds, Abd Soft, No tenderness,    Extremity/Skin:- No  edema,   good pulses Psych-affect is appropriate, oriented x3 Neuro-residual left-sided hemiparesis, appears to be at baseline  Data Review   CBC w Diff:  Lab Results  Component Value Date   WBC 3.4 (L) 05/07/2018   HGB 12.0 (L) 05/07/2018   HGB 14.2 04/07/2018   HCT 39.3 05/07/2018   HCT 43.5 04/07/2018   PLT 201 05/07/2018   PLT 215 04/07/2018   LYMPHOPCT 44 04/26/2018   MONOPCT 13 04/26/2018   EOSPCT 1 04/26/2018   BASOPCT 0 04/26/2018    CMP:  Lab Results  Component Value Date   NA 140 05/06/2018   NA 147 (H) 03/04/2018   K 4.4 05/06/2018   CL 111 05/06/2018   CO2 25 05/06/2018   BUN 21 05/06/2018   BUN 11 03/04/2018   CREATININE 1.42 (H) 05/06/2018  PROT 6.1 (L) 05/05/2018   PROT 6.3 03/04/2018   ALBUMIN 3.4 (L) 05/05/2018   ALBUMIN 3.9 03/04/2018   BILITOT 0.5 05/05/2018   BILITOT 0.3 03/04/2018   ALKPHOS 36 (L) 05/05/2018   AST 19 05/05/2018   ALT 18 05/05/2018  .   Total Discharge time is about 33 minutes  Shon Hale M.D on 05/07/2018 at 9:59 AM   Go to [email or web address] - password [email or web address] for contact info  [facility] - Office  [phone]

## 2018-05-09 ENCOUNTER — Ambulatory Visit: Payer: Self-pay | Admitting: Cardiology

## 2018-05-12 ENCOUNTER — Other Ambulatory Visit: Payer: Self-pay | Admitting: Physician Assistant

## 2018-05-12 ENCOUNTER — Ambulatory Visit: Payer: Medicaid Other | Attending: Internal Medicine | Admitting: Pharmacist

## 2018-05-12 DIAGNOSIS — F329 Major depressive disorder, single episode, unspecified: Secondary | ICD-10-CM

## 2018-05-12 DIAGNOSIS — F32A Depression, unspecified: Secondary | ICD-10-CM

## 2018-05-12 DIAGNOSIS — Z7901 Long term (current) use of anticoagulants: Secondary | ICD-10-CM | POA: Diagnosis not present

## 2018-05-12 DIAGNOSIS — Z86711 Personal history of pulmonary embolism: Secondary | ICD-10-CM

## 2018-05-12 LAB — POCT INR: INR: 1.4 — AB (ref 2.0–3.0)

## 2018-05-12 MED FILL — hydrOXYzine HCL 25 MG TABS: 25 | 7 days supply | Qty: 30 | Fill #0

## 2018-05-12 MED FILL — LOSARTAN POTASSIUM 100 MG T: 100 | 30 days supply | Qty: 30 | Fill #0

## 2018-05-12 MED FILL — MECLIZINE 25 MG TABLET: 25 | 30 days supply | Qty: 30 | Fill #0

## 2018-05-12 MED FILL — ROSUVASTATIN CALCIUM 40 MG: 40 | 30 days supply | Qty: 30 | Fill #0

## 2018-05-12 MED FILL — DULoxetine HCL 30 MG CPEP: 30 | 30 days supply | Qty: 60 | Fill #0

## 2018-05-12 MED FILL — GABAPENTIN 400 MG CAPSULE: 400 | 30 days supply | Qty: 60 | Fill #0

## 2018-05-12 MED FILL — DIVALPROEX SOD DR 500 MG TA: 500 | 30 days supply | Qty: 60 | Fill #0

## 2018-05-12 MED FILL — METHOCARBAMOL 750 MG TABS: 750 | 30 days supply | Qty: 90 | Fill #0

## 2018-05-12 MED FILL — WARFARIN SODIUM 5 MG TABLET: 5 | 30 days supply | Qty: 30 | Fill #3

## 2018-05-12 MED FILL — OMEPRAZOLE 20 MG CAP: 20 | 30 days supply | Qty: 30 | Fill #2

## 2018-05-12 MED FILL — traZODone HCL 100 MG TABS: 100 | 30 days supply | Qty: 30 | Fill #0

## 2018-05-12 NOTE — Progress Notes (Signed)
    Pharmacy Anticoagulation Clinic  Subjective: Patient presents today for INR monitoring. 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   HPI:  Patient was hospitalized from 04/26/18 to 05/01/18. Presented with loss of consciousness, abdominal pain, and bradycardia. Found to be in heart block in ED with metabolic acidosis and AKI. Renal function and bradycardia improved throughout hospitalization. Patient's Coumadin was held and he underwent cardiac cath. Coumadin resumed after cath lab and continued upon discharge w/ Lovenox bridge.   Pt returned to ED 05/05/18 with dyspnea and recurrent symptoms from previous hospitalization. Recurrent AKI. Patient had a seizure in ED and reported seizure at home that prompted his presentation . Was started on IV Depakote then transitioned to PO. DC on 05/07/18. Of note, pt resumed taking warfarin at home dose listed below upon DC. Lovenox was stopped at discharge.   Current dose of warfarin: 5 mg MWF 2.5 mg all other days  Adherence to warfarin: yes  Signs/symptoms of bleeding: none Recent changes in diet: none  Recent changes in medications: yes - divalproex restarted after recent hospitalization Upcoming procedures that may impact anticoagulation: patient states he has dental procedure planned but is awaiting PCP to approve. He see's Dr. Laural Benes 06/06/18.    Objective: Today's INR = 1.4  Lab Results  Component Value Date   INR 1.41 05/07/2018   INR 1.40 05/05/2018   INR 1.20 05/01/2018     Assessment and Plan: Anticoagulation: Patient is Subtherapeutic based on INR of 1.4 and patient's INR goal of 2-3. Will Increase current warfarin dose. Patient has history of labile INRs and bleeding events. I will follow him closely and adjust warfarin schedule gradually. Pt is no longer taking Lovenox. He states today that he is feeling better since hospitalization and that he has no problem taking his medications. He has no concerns with his warfarin use. He  understands that this medication is to be continued indefinitely.   - patient to take an extra 0.5 tablets today and then resume weekly schedule.  Patient verbalized understanding and was provided with written instructions. Next INR check planned for 05/20/18.   Butch Penny, PharmD, CPP Clinical Pharmacist [facility] [phone]

## 2018-05-13 MED FILL — FOLIC ACID 1 MG TABS: 1 | 30 days supply | Qty: 30 | Fill #0

## 2018-05-20 ENCOUNTER — Ambulatory Visit: Payer: Medicaid Other | Attending: Family Medicine | Admitting: Pharmacist

## 2018-05-20 ENCOUNTER — Ambulatory Visit (HOSPITAL_BASED_OUTPATIENT_CLINIC_OR_DEPARTMENT_OTHER): Payer: Medicaid Other | Admitting: Family Medicine

## 2018-05-20 ENCOUNTER — Encounter: Payer: Self-pay | Admitting: Family Medicine

## 2018-05-20 VITALS — BP 174/91 | HR 79 | Temp 98.3°F | Ht 71.0 in | Wt 209.0 lb

## 2018-05-20 DIAGNOSIS — I10 Essential (primary) hypertension: Secondary | ICD-10-CM

## 2018-05-20 DIAGNOSIS — Z8673 Personal history of transient ischemic attack (TIA), and cerebral infarction without residual deficits: Secondary | ICD-10-CM | POA: Insufficient documentation

## 2018-05-20 DIAGNOSIS — G894 Chronic pain syndrome: Secondary | ICD-10-CM

## 2018-05-20 DIAGNOSIS — G8929 Other chronic pain: Secondary | ICD-10-CM | POA: Diagnosis not present

## 2018-05-20 DIAGNOSIS — Z981 Arthrodesis status: Secondary | ICD-10-CM | POA: Diagnosis not present

## 2018-05-20 DIAGNOSIS — Z79899 Other long term (current) drug therapy: Secondary | ICD-10-CM | POA: Insufficient documentation

## 2018-05-20 DIAGNOSIS — Z79891 Long term (current) use of opiate analgesic: Secondary | ICD-10-CM | POA: Diagnosis not present

## 2018-05-20 DIAGNOSIS — Z9889 Other specified postprocedural states: Secondary | ICD-10-CM | POA: Diagnosis not present

## 2018-05-20 DIAGNOSIS — R569 Unspecified convulsions: Secondary | ICD-10-CM | POA: Insufficient documentation

## 2018-05-20 DIAGNOSIS — K0889 Other specified disorders of teeth and supporting structures: Secondary | ICD-10-CM

## 2018-05-20 DIAGNOSIS — Z86711 Personal history of pulmonary embolism: Secondary | ICD-10-CM

## 2018-05-20 DIAGNOSIS — M4802 Spinal stenosis, cervical region: Secondary | ICD-10-CM | POA: Insufficient documentation

## 2018-05-20 LAB — POCT INR: INR: 1.6 — AB (ref 2.0–3.0)

## 2018-05-20 IMAGING — CT CT CERVICAL SPINE W/O CM
3 of 5 series · 15 of 33 positions shown, 18 images · non-contrast
Comparison: CT cervical spine dated May 15, 2017.

CLINICAL DATA: Prior ACDF in January 2017 with worsening weakness and
numbness of the left arm, as well as the left leg.

EXAM:
CT CERVICAL SPINE WITHOUT CONTRAST
TECHNIQUE: Multidetector CT imaging of the cervical spine was performed without
intravenous contrast. Multiplanar CT image reconstructions were also
generated.

[Series 200: cor · coronal · 0.39mm/px · 3 of 76 slices shown]
[im 16/76  bone]
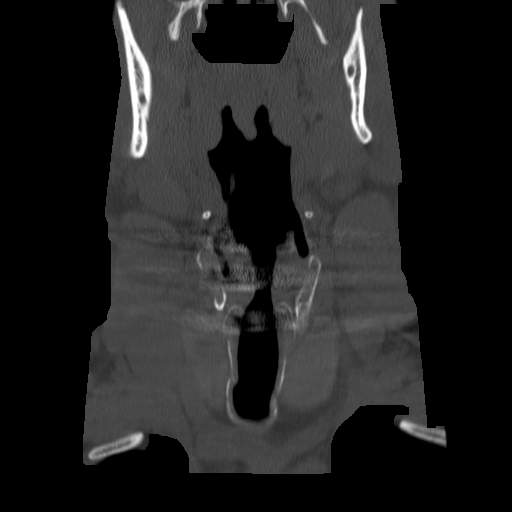
[im 31/76  bone]
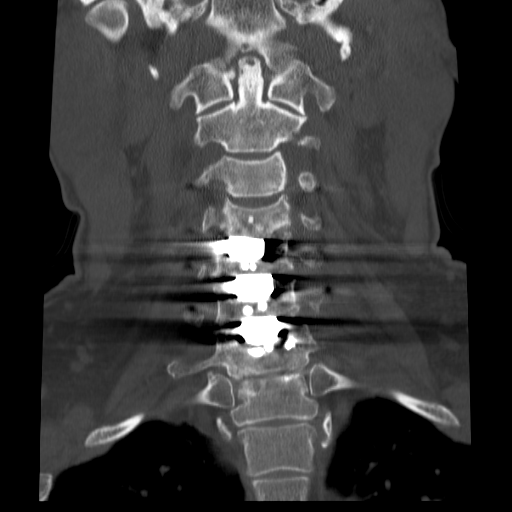
[im 46/76  bone]
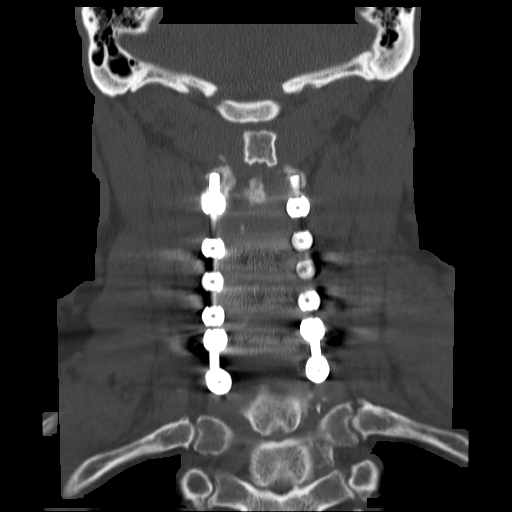

[Series 201: sag · sagittal · 0.39mm/px · 5 of 68 slices shown, 6 images]
[im 23/68  bone]
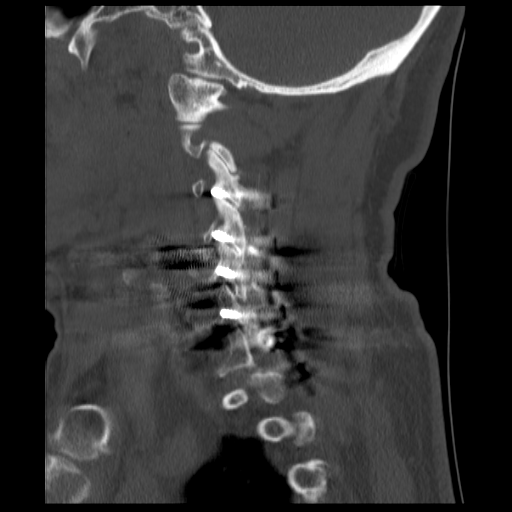
[im 28/68  bone]
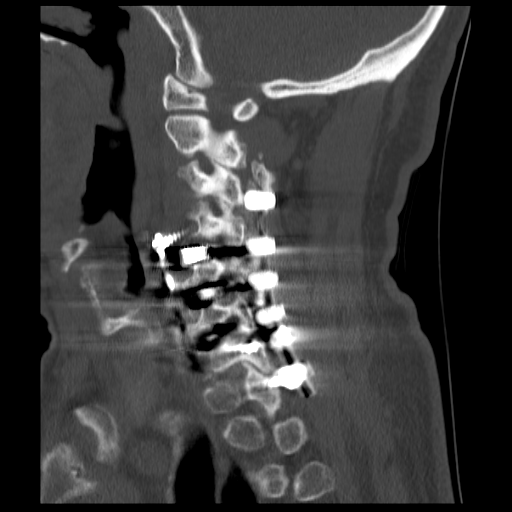
[im 34/68  soft-tissue]
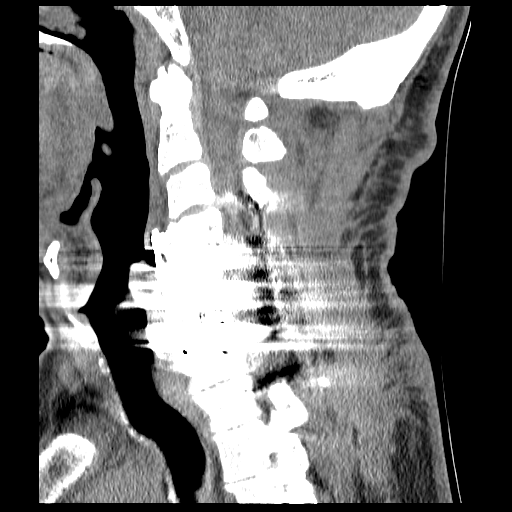
[im 34/68  bone]
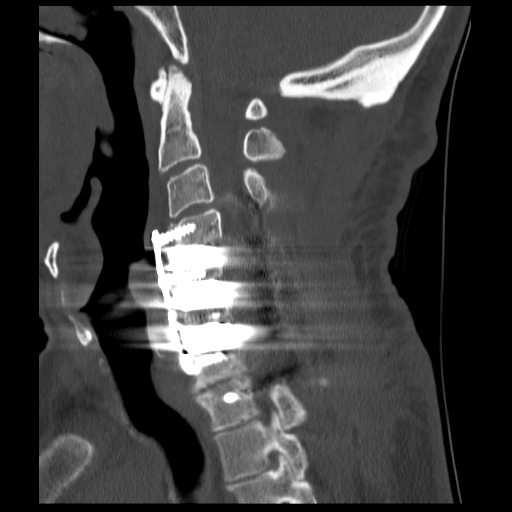
[im 40/68  bone]
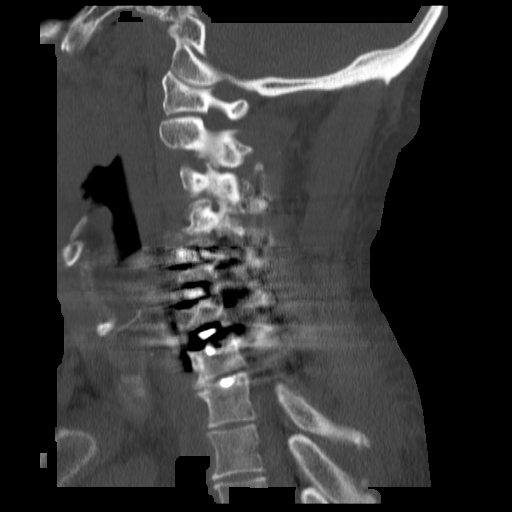
[im 45/68  bone]
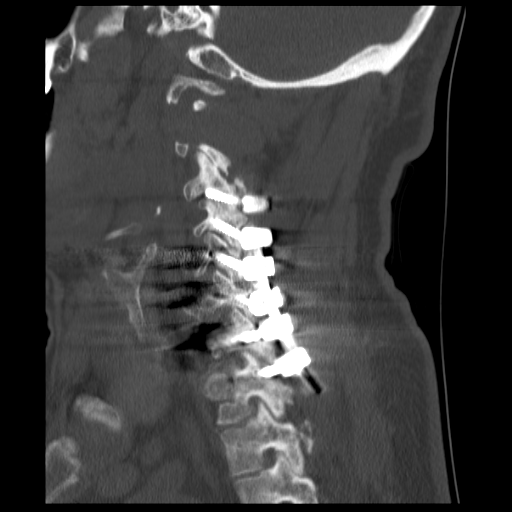

[Series 202: angled axial · axial · 0.20mm/px · z∈[-17,+138]mm · 7 of 304 slices shown, 9 images]
[im 38/304  soft-tissue]
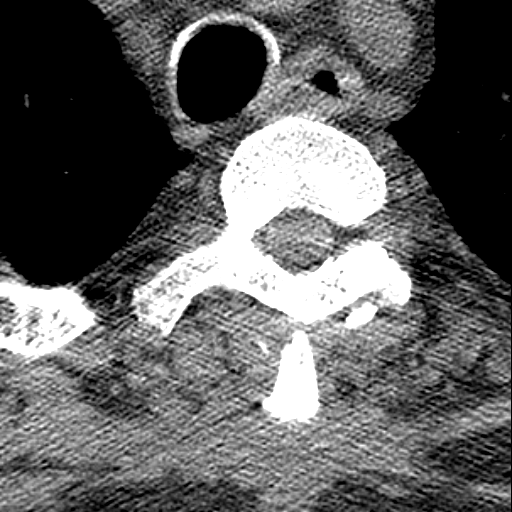
[im 38/304  bone]
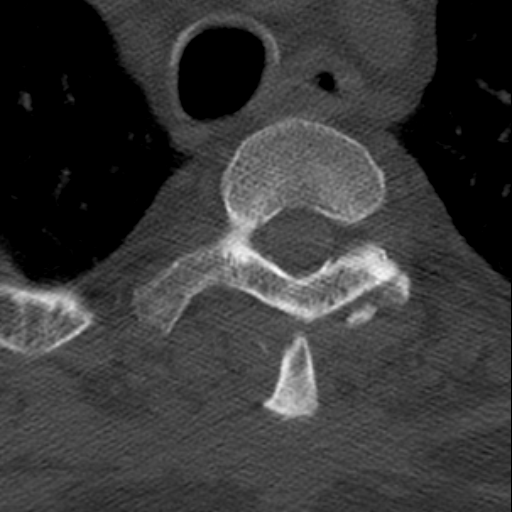
[im 76/304  bone]
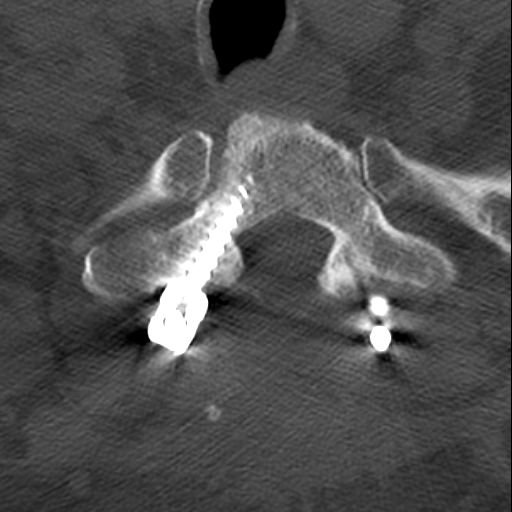
[im 114/304  bone]
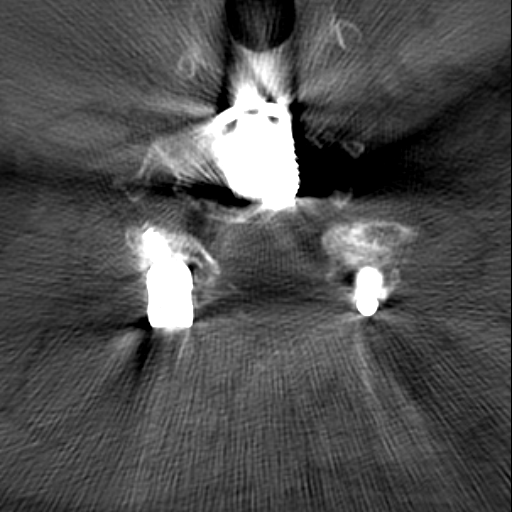
[im 152/304  bone]
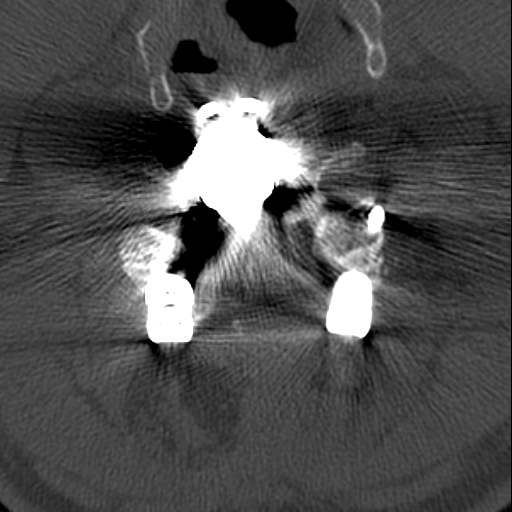
[im 190/304  soft-tissue]
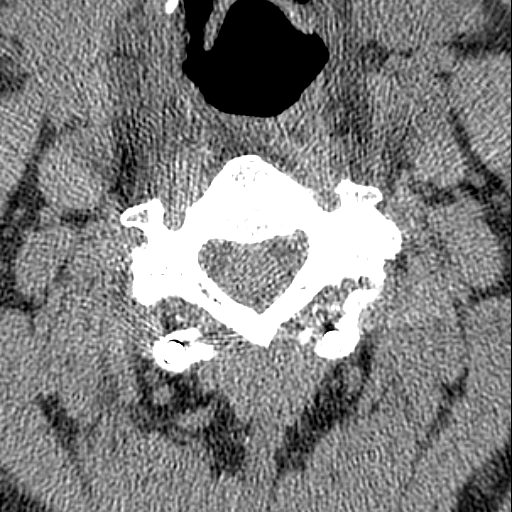
[im 190/304  bone]
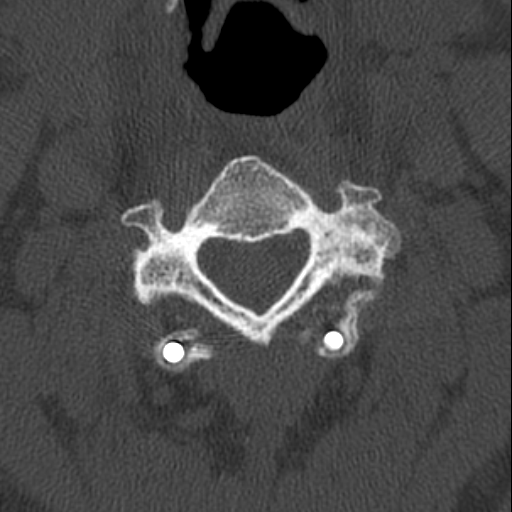
[im 228/304  bone]
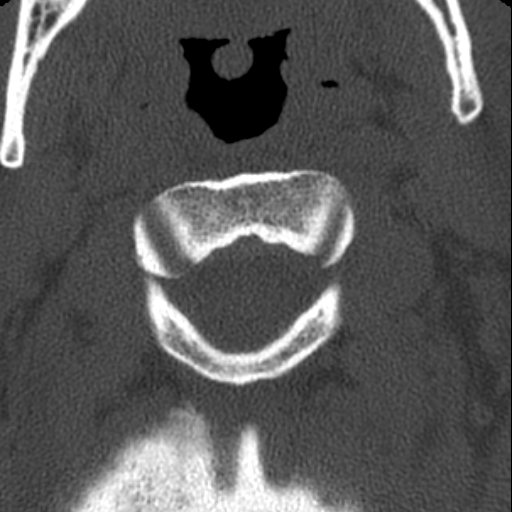
[im 266/304  bone]
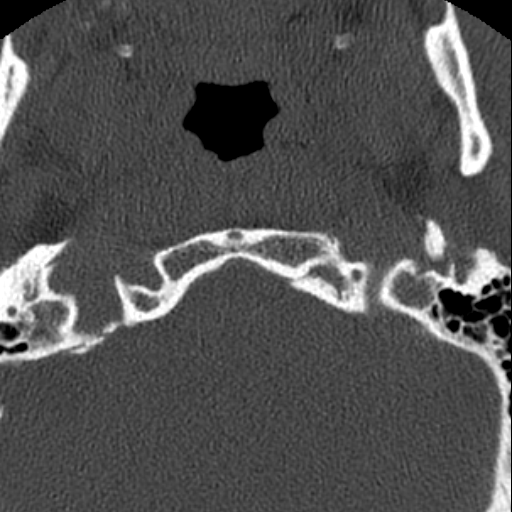

[15 of 33 positions shown; findings below may reference images not displayed]

FINDINGS: Alignment: Straightening of the normal cervical lordosis.

Skull base and vertebrae: Postsurgical changes related to prior
C4-C7 ACDF and C3-T1 posterior fusion. The right pedicle screw is
barely within the the vertebral body, but this is unchanged since
initial postoperative x-rays. No definite evidence of hardware
complication. Solid osseous fusion of the left C3-C4 facets,
unchanged. No acute fracture or focal pathologic process.

Soft tissues and spinal canal: No prevertebral soft tissue swelling.
Evaluation of the spinal canal is limited by streak artifact.

Disc levels: Prior C4-C7 posterior decompression. Unchanged residual
moderate left neuroforaminal stenosis at C3-C4 due to facet
uncovertebral hypertrophy. Mild bilateral neuroforaminal stenosis at
C4-C5 and on the left at C5-C6. Moderate disc height loss at C7-T1.

Upper chest: Negative.

Other: None.
IMPRESSION: 1. Postsurgical changes related to prior C4-C7 ACDF and C3-T1
posterior fusion. No evidence of hardware complication or acute
osseous abnormality.
2. Residual moderate left neuroforaminal stenosis at C3-C4 due to
facet uncovertebral hypertrophy, unchanged.

## 2018-05-20 MED ORDER — AMOXICILLIN 500 MG PO CAPS: 500.0000 mg | ORAL_CAPSULE | Freq: Three times a day (TID) | ORAL | 0 refills | Status: DC

## 2018-05-20 NOTE — Patient Instructions (Signed)
Dental Pain Dental pain may be caused by many things, including:  Tooth decay (cavities or caries). Cavities cause the nerve of your tooth to be open to air and hot or cold temperatures. This can cause pain or discomfort.  Abscess or infection. A dental abscess is an area that is full of infected pus from a bacterial infection in the inner part of the tooth (pulp). It usually happens at the end of the tooth's root.  Injury.  An unknown reason (idiopathic).  Your pain may be mild or severe. It may only happen when:  You are chewing.  You are exposed to hot or cold temperature.  You are eating or drinking sugary foods or beverages, such as: ? Soda. ? Candy.  Your pain may also be there all of the time. Follow these instructions at home: Watch your dental pain for any changes. Do these things to lessen your discomfort:  Take medicines only as told by your dentist.  If your dentist tells you to take an antibiotic medicine, finish all of it even if you start to feel better.  Keep all follow-up visits as told by your dentist. This is important.  Do not apply heat to the outside of your face.  Rinse your mouth or gargle with salt water if told by your dentist. This helps with pain and swelling. ? You can make salt water by adding  tsp of salt to 1 cup of warm water.  Apply ice to the painful area of your face: ? Put ice in a plastic bag. ? Place a towel between your skin and the bag. ? Leave the ice on for 20 minutes, 2-3 times per day.  Avoid foods or drinks that cause you pain, such as: ? Very hot or very cold foods or drinks. ? Sweet or sugary foods or drinks.  Contact a doctor if:  Your pain is not helped with medicines.  Your symptoms are worse.  You have new symptoms. Get help right away if:  You cannot open your mouth.  You are having trouble breathing or swallowing.  You have a fever.  Your face, neck, or jaw is puffy (swollen). This information is not  intended to replace advice given to you by your health care provider. Make sure you discuss any questions you have with your health care provider. Document Released: 03/26/2008 Document Revised: 03/15/2016 Document Reviewed: 10/04/2014 Elsevier Interactive Patient Education  2018 [employer]  

## 2018-05-20 NOTE — Progress Notes (Signed)
Subjective:  Patient ID: Russell Carter, male    DOB: 1956-03-21  Age: 62 y.o. MRN: [id number]  CC: Dental Pain   HPI Russell Carter is a 62 year old male with a history of seizures, hypertension, previous history of CVA, previous PE (on anti-coagulation with Coumadin), chronic pain secondary to cervical stenosis, coronary artery disease who presents today for an acute visit complaining of toothache and swelling of his left jaw.  He is requesting antibiotics for this as he has had significantly swelling but no fevers and is in the process of being worked up for tooth extraction by his oral surgeon and has an appointment for dental clearance with his PCP on 06/05/2018.  He was hospitalized at [facility] from 05/05/2018 through 05/07/2018 for seizures and had endorsed not taking his Depakote.  Labs had revealed elevated creatinine of 1.7 up from a baseline of 1.3, subtherapeutic INR 1.4. His home medications were restarted, with a trending down of his creatinine back to baseline and he was subsequently discharged to follow-up with neurology and here in the clinic.  His blood pressure is significantly elevated today at 174/91 and he endorses compliance with his antihypertensives.  At discharge his blood pressure was normal at 135/86. He denies any seizures since discharge. His INR is 1.6 today.  Past Medical History:  Diagnosis Date  . Anginal pain (HCC)   . Anxiety   . Arthritis   . Bipolar disorder (HCC)   . Blood in stool   . CAD (coronary artery disease)    a. 2011: cath showing mod non-obstructive disease b. 02/2016: cath with 95% stenosis in the Mid RCA. DES placed.  . Cervical stenosis of spine   . Cocaine abuse (HCC)    history  . Depression   . Diabetes mellitus without complication (HCC)   . Gout, unspecified   . Headache   . HTN (hypertension)   . Hypercholesterolemia   . Hyperglycemia   . Migraines   . Myocardial infarction [facility]) 2008   drug cocaine and marijuana  at time of mi none  since    . Pneumonia   . Rectal bleeding   . Renal insufficiency   . Seizures (HCC)   . Stroke [facility])     Past Surgical History:  Procedure Laterality Date  . ANTERIOR CERVICAL DECOMP/DISCECTOMY FUSION N/A 01/24/2017   Procedure: Cervical Four-Five,Cervical Five Six,Cervical Six-Seven Anterior cervical discectomy with fusion and plate with Cervical Three-Thoracic- One Laminectomy/Foraminotomy with Cervical Three-Thoracic- One Fixation and fusion;  Surgeon: Loura Halt Ditty, MD;  Location: [facility] OR;  Service: Neurosurgery;  Laterality: N/A;  Part-1 anterior approach  . CARDIAC CATHETERIZATION  2008; ~ 2011   [place]-40%lad  . CARDIAC CATHETERIZATION N/A 03/16/2016   Procedure: Left Heart Cath and Coronary Angiography;  Surgeon: Marykay Lex, MD;  Location: [facility] INVASIVE CV LAB;  Service: Cardiovascular;  Laterality: N/A;  . COLONOSCOPY WITH PROPOFOL N/A 04/17/2018   Procedure: COLONOSCOPY WITH PROPOFOL;  Surgeon: Graylin Shiver, MD;  Location: [facility] ENDOSCOPY;  Service: Endoscopy;  Laterality: N/A;  . CORONARY ANGIOPLASTY     STENTS  . EYE SURGERY Bilateral    "for double vision"  . INGUINAL HERNIA REPAIR  05/12/2012   Procedure: HERNIA REPAIR INGUINAL ADULT;  Surgeon: Liz Malady, MD;  Location: [facility];  Service: General;  Laterality: Right;  . INGUINAL HERNIA REPAIR Left 02/02/2003   Dr. Violeta Gelinas. repair with mesh. Same procedure done again in  05/22/2004   . INGUINAL HERNIA  REPAIR Left ?date 2nd OR  . LEFT HEART CATH AND CORONARY ANGIOGRAPHY N/A 04/29/2018   Procedure: LEFT HEART CATH AND CORONARY ANGIOGRAPHY;  Surgeon: Swaziland, Peter M, MD;  Location: [facility] INVASIVE CV LAB;  Service: Cardiovascular;  Laterality: N/A;  . POSTERIOR CERVICAL FUSION/FORAMINOTOMY N/A 01/24/2017   Procedure: Cervical Three-Thoracic One-Laminectomy/Foraminotomy with Cervical Three-Thoracic One Fixation and fusion;  Surgeon: Loura Halt Ditty, MD;  Location: [facility] OR;   Service: Neurosurgery;  Laterality: N/A;  Part-2 Posterior approach  . RECONSTRUCT / STABILIZE DISTAL ULNA    . SHOULDER OPEN ROTATOR CUFF REPAIR Left 2010  . SHOULDER SURGERY Left    "injured on the job; had to cut small bone out"  . THUMB FUSION Right     Allergies  Allergen Reactions  . Shellfish Allergy Anaphylaxis     Outpatient Medications Prior to Visit  Medication Sig Dispense Refill  . acetaminophen (TYLENOL) 325 MG tablet Take 2 tablets (650 mg total) by mouth every 6 (six) hours as needed for mild pain (or Fever >/= 101). 30 tablet 1  . acetaminophen (TYLENOL) 500 MG tablet Take 1,000 mg by mouth every 6 (six) hours as needed for headache (pain).    Marland Kitchen allopurinol (ZYLOPRIM) 300 MG tablet Take 1 tablet (300 mg total) by mouth daily. 90 tablet 6  . Calcium Carb-Cholecalciferol (CALCIUM-VITAMIN D) 600-400 MG-UNIT TABS Take 1 tablet by mouth daily. 30 tablet 5  . clotrimazole (LOTRIMIN) 1 % cream Apply 1 application topically 2 (two) times daily.    . divalproex (DEPAKOTE) 500 MG DR tablet Take 1 tablet (500 mg total) by mouth 2 (two) times daily. 60 tablet 5  . DULoxetine (CYMBALTA) 30 MG capsule Take 1 capsule (30 mg total) by mouth 2 (two) times daily. 60 capsule 5  . folic acid (FOLVITE) 1 MG tablet Take 1 tablet (1 mg total) by mouth daily. 30 tablet 5  . gabapentin (NEURONTIN) 400 MG capsule Take 1 capsule (400 mg total) by mouth 2 (two) times daily. 180 capsule 3  . hydrOXYzine (ATARAX/VISTARIL) 25 MG tablet Take 1 tablet (25 mg total) by mouth every 6 (six) hours as needed. for anxiety 30 tablet 2  . LORazepam (ATIVAN) 0.5 MG tablet Take 1 tablet (0.5 mg total) by mouth every 8 (eight) hours as needed for anxiety. 12 tablet 0  . losartan (COZAAR) 100 MG tablet Take 1 tablet (100 mg total) by mouth daily. 30 tablet 5  . meclizine (ANTIVERT) 25 MG tablet Take 1 tablet (25 mg total) by mouth daily as needed for dizziness or nausea (vertigo). 30 tablet 0  . methocarbamol  (ROBAXIN) 750 MG tablet Take 1 tablet (750 mg total) by mouth every 8 (eight) hours as needed for muscle spasms. 90 tablet 1  . metoCLOPramide (REGLAN) 10 MG tablet TAKE 1 TABLET BY MOUTH EVERY 6 HOURS AS NEEDED FOR NAUSEA OR HEADACHE. 10 tablet 0  . Multiple Vitamin (MULTIVITAMIN WITH MINERALS) TABS tablet Take 1 tablet by mouth daily. 30 tablet 5  . nitroGLYCERIN (NITROSTAT) 0.4 MG SL tablet Place 1 tablet (0.4 mg total) under the tongue every 5 (five) minutes as needed for chest pain. (Patient not taking: Reported on 04/11/2018) 25 tablet 4  . omeprazole (PRILOSEC) 20 MG capsule Take 20 mg by mouth daily.  3  . ondansetron (ZOFRAN) 4 MG tablet Take 1 tablet (4 mg total) by mouth every 6 (six) hours as needed for nausea. 20 tablet 0  . oxyCODONE-acetaminophen (PERCOCET) 7.5-325 MG tablet Take 1 tablet by  mouth every 4 (four) hours as needed for severe pain.    . pantoprazole (PROTONIX) 40 MG tablet Take 1 tablet (40 mg total) by mouth at bedtime. (Patient not taking: Reported on 04/28/2018) 30 tablet 1  . Polyethyl Glycol-Propyl Glycol (SYSTANE OP) Place 1 drop into both eyes 2 (two) times daily.    . rosuvastatin (CRESTOR) 40 MG tablet Take 1 tablet (40 mg total) by mouth daily. 30 tablet 5  . traZODone (DESYREL) 100 MG tablet Take 1 tablet (100 mg total) by mouth at bedtime as needed for sleep. 30 tablet 5  . warfarin (COUMADIN) 5 MG tablet Take 1 tablet (5 mg total) by mouth daily. 30 tablet 3   No facility-administered medications prior to visit.     ROS Review of Systems  Constitutional: Negative for activity change and appetite change.  HENT: Positive for dental problem. Negative for sinus pressure and sore throat.   Eyes: Negative for visual disturbance.  Respiratory: Negative for cough, chest tightness and shortness of breath.   Cardiovascular: Negative for chest pain and leg swelling.  Gastrointestinal: Negative for abdominal distention, abdominal pain, constipation and diarrhea.    Endocrine: Negative.   Genitourinary: Negative for dysuria.  Musculoskeletal: Negative for joint swelling and myalgias.  Skin: Negative for rash.  Allergic/Immunologic: Negative.   Neurological: Negative for weakness, light-headedness and numbness.  Psychiatric/Behavioral: Negative for dysphoric mood and suicidal ideas.    Objective:  BP (!) 174/91   Pulse 79   Temp 98.3 F (36.8 C) (Oral)   Ht 5\' 11"  (1.803 m)   Wt 209 lb (94.8 kg)   SpO2 97%   BMI 29.15 kg/m   BP/Weight 05/20/2018 05/07/2018 05/01/2018  Systolic BP 174 138 156  Diastolic BP 91 86 87  Wt. (Lbs) 209 224.65 203.7  BMI 29.15 31.33 28.41     Physical Exam  Constitutional: He is oriented to person, place, and time. He appears well-developed and well-nourished.  HENT:  Left jaw swelling, abscess cavity in left lower gum surrounding lower premolars  Cardiovascular: Normal rate, normal heart sounds and intact distal pulses.  No murmur heard. Pulmonary/Chest: Effort normal and breath sounds normal. He has no wheezes. He has no rales. He exhibits no tenderness.  Abdominal: Soft. Bowel sounds are normal. He exhibits no distension and no mass. There is no tenderness.  Musculoskeletal: Normal range of motion.  Neurological: He is alert and oriented to person, place, and time.  Skin: Skin is warm and dry.  Psychiatric: He has a normal mood and affect.     CMP Latest Ref Rng & Units 05/06/2018 05/05/2018 05/01/2018  Glucose 70 - 99 mg/dL 144(R) 86 154(M)  BUN 8 - 23 mg/dL 21 08(Q) 8  Creatinine 0.61 - 1.24 mg/dL 7.61(P) 5.09(T) 2.67(T)  Sodium 135 - 145 mmol/L 140 141 139  Potassium 3.5 - 5.1 mmol/L 4.4 4.8 3.8  Chloride 98 - 111 mmol/L 111 112(H) 102  CO2 22 - 32 mmol/L 25 18(L) 25  Calcium 8.9 - 10.3 mg/dL 8.2(L) 8.8(L) 9.0  Total Protein 6.5 - 8.1 g/dL - 6.1(L) 6.4(L)  Total Bilirubin 0.3 - 1.2 mg/dL - 0.5 0.6  Alkaline Phos 38 - 126 U/L - 36(L) 39  AST 15 - 41 U/L - 19 26  ALT 0 - 44 U/L - 18 27    Lab  Results  Component Value Date   WBC 3.4 (L) 05/07/2018   HGB 12.0 (L) 05/07/2018   HCT 39.3 05/07/2018   MCV 96.1 05/07/2018  PLT 201 05/07/2018    Lab Results  Component Value Date   INR 1.6 (A) 05/20/2018   INR 1.4 (A) 05/12/2018   INR 1.41 05/07/2018    Assessment & Plan:   1. Essential hypertension Elevated blood pressure likely due to pain Will need to be reassessed at next office visit prior to clearance for oral surgery Counseled on blood pressure goal of less than 130/80, low-sodium, DASH diet, medication compliance, 150 minutes of moderate intensity exercise per week. Discussed medication compliance, adverse effects.  2. Pain, dental Placed on amoxicillin He does have opioid analgesics from his pain doctor Dental clearance on hold pending resolution of acute issues Will need bridging with Lovenox and adjusting INR prior to oral surgery once he is optimized medically; will defer to PCP  3. History of pulmonary embolus (PE) History of CVA and PE Subtherapeutic INR 1.6 We will hold off on adjusting today to prevent supratherapeutic INR due to interaction of amoxicillin with warfarin He will see the clinical pharmacist next week for INR check  4. Chronic pain disorder Secondary to cervical stenosis Currently managed by pain management  5. Seizure (HCC) Stable, no recent seizures   Meds ordered this encounter  Medications  . amoxicillin (AMOXIL) 500 MG capsule    Sig: Take 1 capsule (500 mg total) by mouth 3 (three) times daily.    Dispense:  30 capsule    Refill:  0    Follow-up: Return for Follow-up follow-up hypertension and clearance for dental procedure, keep appointment with PCP.   Hoy Register MD

## 2018-05-21 ENCOUNTER — Encounter: Payer: Self-pay | Admitting: Family Medicine

## 2018-05-23 ENCOUNTER — Encounter (HOSPITAL_COMMUNITY): Payer: Self-pay | Admitting: Emergency Medicine

## 2018-05-23 ENCOUNTER — Emergency Department (HOSPITAL_COMMUNITY)
Admission: EM | Admit: 2018-05-23 | Discharge: 2018-05-23 | Disposition: A | Discharge status: Home / Self Care | Payer: Medicaid Other | Attending: Emergency Medicine | Admitting: Emergency Medicine

## 2018-05-23 DIAGNOSIS — N183 Chronic kidney disease, stage 3 (moderate): Secondary | ICD-10-CM | POA: Insufficient documentation

## 2018-05-23 DIAGNOSIS — I129 Hypertensive chronic kidney disease with stage 1 through stage 4 chronic kidney disease, or unspecified chronic kidney disease: Secondary | ICD-10-CM | POA: Diagnosis not present

## 2018-05-23 DIAGNOSIS — Z7901 Long term (current) use of anticoagulants: Secondary | ICD-10-CM | POA: Insufficient documentation

## 2018-05-23 DIAGNOSIS — E1122 Type 2 diabetes mellitus with diabetic chronic kidney disease: Secondary | ICD-10-CM | POA: Diagnosis not present

## 2018-05-23 DIAGNOSIS — K029 Dental caries, unspecified: Secondary | ICD-10-CM | POA: Diagnosis not present

## 2018-05-23 DIAGNOSIS — Z79899 Other long term (current) drug therapy: Secondary | ICD-10-CM | POA: Insufficient documentation

## 2018-05-23 DIAGNOSIS — K047 Periapical abscess without sinus: Secondary | ICD-10-CM | POA: Insufficient documentation

## 2018-05-23 DIAGNOSIS — K089 Disorder of teeth and supporting structures, unspecified: Secondary | ICD-10-CM | POA: Diagnosis present

## 2018-05-23 DIAGNOSIS — I259 Chronic ischemic heart disease, unspecified: Secondary | ICD-10-CM | POA: Diagnosis not present

## 2018-05-23 MED ORDER — LIDOCAINE HCL (PF) 1 % IJ SOLN
5.0000 mL | Freq: Once | INTRAMUSCULAR | Status: AC
  Administered 2018-05-23: 5 mL
  Filled 2018-05-23: qty 5

## 2018-05-23 MED ORDER — CLINDAMYCIN HCL 300 MG PO CAPS: 300.0000 mg | ORAL_CAPSULE | Freq: Three times a day (TID) | ORAL | 0 refills | Status: AC

## 2018-05-23 NOTE — ED Notes (Signed)
APP at bedside 

## 2018-05-23 NOTE — ED Notes (Signed)
Patient able to ambulate independently  

## 2018-05-23 NOTE — Discharge Instructions (Signed)
Follow-up with your dentist as soon as possible.  Discontinue the amoxicillin and start clindamycin today.  Return to the emergency room for any worsening or concerning symptoms.

## 2018-05-23 NOTE — ED Provider Notes (Signed)
[name] EMERGENCY DEPARTMENT Provider Note   CSN: [id number] Arrival date & time: 05/23/18  1345     History   Chief Complaint Chief Complaint  Patient presents with  . Dental Pain    HPI Russell Carter is a 62 y.o. male.  62 year old male presents with complaint of left upper dental pain and abscess.  Patient states this tooth has been bothering him for at least the past 2 weeks, known dental decay now with facial swelling and swelling along his gumline with occasional drainage.  He denies fevers or trauma.  Patient was recently admitted to the ICU for heart and kidney troubles and is awaiting clearance before he can follow-up with his dentist.  His family doctor 2 days ago and given amoxicillin which is not helping.  No other complaints or concerns.     Past Medical History:  Diagnosis Date  . Anginal pain (HCC)   . Anxiety   . Arthritis   . Bipolar disorder (HCC)   . Blood in stool   . CAD (coronary artery disease)    a. 2011: cath showing mod non-obstructive disease b. 02/2016: cath with 95% stenosis in the Mid RCA. DES placed.  . Cervical stenosis of spine   . Cocaine abuse (HCC)    history  . Depression   . Diabetes mellitus without complication (HCC)   . Gout, unspecified   . Headache   . HTN (hypertension)   . Hypercholesterolemia   . Hyperglycemia   . Migraines   . Myocardial infarction [name]) 2008   drug cocaine and marijuana at time of mi none  since    . Pneumonia   . Rectal bleeding   . Renal insufficiency   . Seizures (HCC)   . Stroke [facility])     Patient Active Problem List   Diagnosis Date Noted  . Seizures (HCC) 05/06/2018  . CKD (chronic kidney disease), stage III (HCC) 05/06/2018  . Seizure (HCC) 05/06/2018  . Normocytic anemia 05/06/2018  . Leukopenia 05/06/2018  . AKI (acute kidney injury) (HCC)   . Acute kidney injury (HCC) 04/27/2018  . Abdominal pain 04/27/2018  . Chronic anticoagulation 04/27/2018  . Left knee  pain 01/10/2018  . Numbness and tingling in both hands 07/31/2017  . Gastroesophageal reflux disease without esophagitis 07/31/2017  . Chronic pain disorder 06/05/2017  . History of pulmonary embolus (PE) 02/14/2017  . Diabetes mellitus type 2 in nonobese (HCC)   . Cocaine abuse (HCC)   . Depression   . History of CVA (cerebrovascular accident)   . Cervical spondylosis with radiculopathy 01/24/2017  . Dyslipidemia 03/15/2016  . Complete heart block (HCC) 03/14/2016  . Cervical spinal stenosis 03/14/2016  . CAD-50% LAD 2011 12/14/2014  . Gout 11/16/2014  . Polysubstance abuse (HCC) 01/15/2013  . DIZZINESS 06/05/2010  . Essential hypertension 05/18/2009    Past Surgical History:  Procedure Laterality Date  . ANTERIOR CERVICAL DECOMP/DISCECTOMY FUSION N/A 01/24/2017   Procedure: Cervical Four-Five,Cervical Five Six,Cervical Six-Seven Anterior cervical discectomy with fusion and plate with Cervical Three-Thoracic- One Laminectomy/Foraminotomy with Cervical Three-Thoracic- One Fixation and fusion;  Surgeon: Loura Halt Ditty, MD;  Location: [facility] OR;  Service: Neurosurgery;  Laterality: N/A;  Part-1 anterior approach  . CARDIAC CATHETERIZATION  2008; ~ 2011   [place]ickery-40%lad  . CARDIAC CATHETERIZATION N/A 03/16/2016   Procedure: Left Heart Cath and Coronary Angiography;  Surgeon: Marykay Lex, MD;  Location: [facility] INVASIVE CV LAB;  Service: Cardiovascular;  Laterality: N/A;  . COLONOSCOPY WITH  PROPOFOL N/A 04/17/2018   Procedure: COLONOSCOPY WITH PROPOFOL;  Surgeon: Graylin Shiver, MD;  Location[facility] ENDOSCOPY;  Service: Endoscopy;  Laterality: N/A;  . CORONARY ANGIOPLASTY     STENTS  . EYE SURGERY Bilateral    "for double vision"  . INGUINAL HERNIA REPAIR  05/12/2012   Procedure: HERNIA REPAIR INGUINAL ADULT;  Surgeon: Liz Malady, MD;  Location: [facility];  Service: General;  Laterality: Right;  . INGUINAL HERNIA REPAIR Left 02/02/2003   Dr. Violeta Gelinas. repair with  mesh. Same procedure done again in  05/22/2004   . INGUINAL HERNIA REPAIR Left ?date 2nd OR  . LEFT HEART CATH AND CORONARY ANGIOGRAPHY N/A 04/29/2018   Procedure: LEFT HEART CATH AND CORONARY ANGIOGRAPHY;  Surgeon: Swaziland, Peter M, MD;  Location: [facility] INVASIVE CV LAB;  Service: Cardiovascular;  Laterality: N/A;  . POSTERIOR CERVICAL FUSION/FORAMINOTOMY N/A 01/24/2017   Procedure: Cervical Three-Thoracic One-Laminectomy/Foraminotomy with Cervical Three-Thoracic One Fixation and fusion;  Surgeon: Loura Halt Ditty, MD;  Location: [facility] OR;  Service: Neurosurgery;  Laterality: N/A;  Part-2 Posterior approach  . RECONSTRUCT / STABILIZE DISTAL ULNA    . SHOULDER OPEN ROTATOR CUFF REPAIR Left 2010  . SHOULDER SURGERY Left    "injured on the job; had to cut small bone out"  . THUMB FUSION Right         Home Medications    Prior to Admission medications   Medication Sig Start Date End Date Taking? Authorizing Provider  acetaminophen (TYLENOL) 325 MG tablet Take 2 tablets (650 mg total) by mouth every 6 (six) hours as needed for mild pain (or Fever >/= 101). 05/07/18   Shon Hale, MD  acetaminophen (TYLENOL) 500 MG tablet Take 1,000 mg by mouth every 6 (six) hours as needed for headache (pain).    [provider]  allopurinol (ZYLOPRIM) 300 MG tablet Take 1 tablet (300 mg total) by mouth daily. 05/02/17   Quentin Angst, MD  amoxicillin (AMOXIL) 500 MG capsule Take 1 capsule (500 mg total) by mouth 3 (three) times daily. 05/20/18   Hoy Register, MD  Calcium Carb-Cholecalciferol (CALCIUM-VITAMIN D) 600-400 MG-UNIT TABS Take 1 tablet by mouth daily. 05/07/18   Shon Hale, MD  clindamycin (CLEOCIN) 300 MG capsule Take 1 capsule (300 mg total) by mouth 3 (three) times daily for 10 days. 05/23/18 06/02/18  Jeannie Fend, PA-C  clotrimazole (LOTRIMIN) 1 % cream Apply 1 application topically 2 (two) times daily.    [provider]  divalproex (DEPAKOTE) 500 MG DR tablet Take 1  tablet (500 mg total) by mouth 2 (two) times daily. 05/07/18   Shon Hale, MD  DULoxetine (CYMBALTA) 30 MG capsule Take 1 capsule (30 mg total) by mouth 2 (two) times daily. 05/07/18   Shon Hale, MD  folic acid (FOLVITE) 1 MG tablet Take 1 tablet (1 mg total) by mouth daily. 05/07/18   Shon Hale, MD  gabapentin (NEURONTIN) 400 MG capsule Take 1 capsule (400 mg total) by mouth 2 (two) times daily. 05/07/18   Shon Hale, MD  hydrOXYzine (ATARAX/VISTARIL) 25 MG tablet Take 1 tablet (25 mg total) by mouth every 6 (six) hours as needed. for anxiety 05/07/18   Shon Hale, MD  LORazepam (ATIVAN) 0.5 MG tablet Take 1 tablet (0.5 mg total) by mouth every 8 (eight) hours as needed for anxiety. 05/07/18 05/07/19  Shon Hale, MD  losartan (COZAAR) 100 MG tablet Take 1 tablet (100 mg total) by mouth daily. 05/07/18   Emokpae,  Courage, MD  meclizine (ANTIVERT) 25 MG tablet Take 1 tablet (25 mg total) by mouth daily as needed for dizziness or nausea (vertigo). 05/07/18   Shon Hale, MD  methocarbamol (ROBAXIN) 750 MG tablet Take 1 tablet (750 mg total) by mouth every 8 (eight) hours as needed for muscle spasms. 05/07/18   Shon Hale, MD  metoCLOPramide (REGLAN) 10 MG tablet TAKE 1 TABLET BY MOUTH EVERY 6 HOURS AS NEEDED FOR NAUSEA OR HEADACHE. 04/22/18   Marcine Matar, MD  Multiple Vitamin (MULTIVITAMIN WITH MINERALS) TABS tablet Take 1 tablet by mouth daily. 05/07/18   Shon Hale, MD  nitroGLYCERIN (NITROSTAT) 0.4 MG SL tablet Place 1 tablet (0.4 mg total) under the tongue every 5 (five) minutes as needed for chest pain. Patient not taking: Reported on 04/11/2018 05/02/17   Quentin Angst, MD  omeprazole (PRILOSEC) 20 MG capsule Take 20 mg by mouth daily. 01/07/18   [provider]  ondansetron (ZOFRAN) 4 MG tablet Take 1 tablet (4 mg total) by mouth every 6 (six) hours as needed for nausea. 05/07/18   Shon Hale, MD  oxyCODONE-acetaminophen  (PERCOCET) 7.5-325 MG tablet Take 1 tablet by mouth every 4 (four) hours as needed for severe pain.    [provider]  pantoprazole (PROTONIX) 40 MG tablet Take 1 tablet (40 mg total) by mouth at bedtime. Patient not taking: Reported on 04/28/2018 12/04/17   Quentin Angst, MD  Polyethyl Glycol-Propyl Glycol (SYSTANE OP) Place 1 drop into both eyes 2 (two) times daily.    [provider]  rosuvastatin (CRESTOR) 40 MG tablet Take 1 tablet (40 mg total) by mouth daily. 05/07/18   Shon Hale, MD  traZODone (DESYREL) 100 MG tablet Take 1 tablet (100 mg total) by mouth at bedtime as needed for sleep. 05/07/18   Shon Hale, MD  warfarin (COUMADIN) 5 MG tablet Take 1 tablet (5 mg total) by mouth daily. 05/07/18   Shon Hale, MD  famotidine (PEPCID) 20 MG tablet Take 1 tablet (20 mg total) by mouth 2 (two) times daily. 12/04/11 12/30/11  Grant Fontana, PA-C  TACRINE HYDROCHLORIDE PO Take 25 mg by mouth daily.    12/30/11  [provider]    Family History Family History  Problem Relation Age of Onset  . Heart attack Mother        x7. Still alive  . Heart attack Father        deceased b/c of MI  . Heart attack Brother        older, deceased b/c of MI   . Cancer Brother        bone cancer in 1 brother, ? brain cancer in other brother    Social History Social History   Tobacco Use  . Smoking status: Never Smoker  . Smokeless tobacco: Never Used  Substance Use Topics  . Alcohol use: Yes    Comment: 16 oz beer every 2-3 days  . Drug use: Yes    Types: "Crack" cocaine, Marijuana, Cocaine    Comment:  "quit 2008"     Allergies   Shellfish allergy   Review of Systems Review of Systems  Constitutional: Negative for chills and fever.  HENT: Positive for dental problem and facial swelling. Negative for congestion, ear pain, trouble swallowing and voice change.   Respiratory: Negative for shortness of breath.   Cardiovascular: Negative for  chest pain.  Gastrointestinal: Negative for nausea and vomiting.  Skin: Negative for rash and wound.  Neurological: Negative  for weakness and headaches.  Hematological: Does not bruise/bleed easily.  Psychiatric/Behavioral: Negative for confusion.  All other systems reviewed and are negative.    Physical Exam Updated Vital Signs BP (!) 150/97 (BP Location: Right Arm)   Pulse 96   Temp 98.3 F (36.8 C) (Oral)   Resp 18   SpO2 97%   Physical Exam  Constitutional: He is oriented to person, place, and time. He appears well-developed and well-nourished. No distress.  HENT:  Head: Atraumatic.    Right Ear: External ear normal.  Left Ear: External ear normal.  Nose: Nose normal.  Mouth/Throat: Uvula is midline and oropharynx is clear and moist. No trismus in the jaw. Dental abscesses and dental caries present. No uvula swelling. No oropharyngeal exudate.    Eyes: Conjunctivae are normal.  Neck: Neck supple.  Cardiovascular: Intact distal pulses.  Pulmonary/Chest: Effort normal.  Lymphadenopathy:    He has no cervical adenopathy.  Neurological: He is alert and oriented to person, place, and time.  Skin: Skin is warm and dry. He is not diaphoretic.  Psychiatric: He has a normal mood and affect. His behavior is normal.  Nursing note and vitals reviewed.    ED Treatments / Results  Labs (all labs ordered are listed, but only abnormal results are displayed) Labs Reviewed - No data to display  EKG None  Radiology No results found.  Procedures .Marland KitchenIncision and Drainage Date/Time: 05/23/2018 2:12 PM Performed by: Jeannie Fend, PA-C Authorized by: Jeannie Fend, PA-C   Consent:    Consent obtained:  Verbal   Consent given by:  Patient   Risks discussed:  Bleeding, incomplete drainage, infection and pain   Alternatives discussed:  No treatment Location:    Type:  Abscess   Size:  1cmx 0.5cm   Location:  Mouth   Mouth location: Left upper gingiva/buccal  mucosa. Anesthesia (see MAR for exact dosages):    Anesthesia method:  Local infiltration   Local anesthetic:  Lidocaine 1% w/o epi Procedure type:    Complexity:  Simple Procedure details:    Needle aspiration: no     Incision types:  Stab incision   Scalpel blade:  11   Wound management:  Probed and deloculated   Drainage:  Purulent   Drainage amount:  Scant   Wound treatment:  Wound left open   Packing materials:  None Post-procedure details:    Patient tolerance of procedure:  Tolerated well, no immediate complications   (including critical care time)  Medications Ordered in ED Medications  lidocaine (PF) (XYLOCAINE) 1 % injection 5 mL (5 mLs Other Given by Other 05/23/18 1404)     Initial Impression / Assessment and Plan / ED Course  I have reviewed the triage vital signs and the nursing notes.  Pertinent labs & imaging results that were available during my care of the patient were reviewed by me and considered in my medical decision making (see chart for details).  Clinical Course as of May 23 1413  Fri May 23, 2018  [id number] 62 year old male with left upper dental abscess, no facial cellulitis.  I&D of abscess with purulent drainage.  Patient advised to discontinue the amoxicillin and start clindamycin.  Patient will call his PCP today and informed them he had his abscess drained in the ER and attempts to expedite his clearance to see his dentist.  Advised patient to return to emergency room for any worsening or concerning symptoms.  At this time there is no trismus or neck  tenderness.   [LM]    Clinical Course User Index [LM] Jeannie Fend, PA-C     Final Clinical Impressions(s) / ED Diagnoses   Final diagnoses:  Dental abscess    ED Discharge Orders        Ordered    clindamycin (CLEOCIN) 300 MG capsule  3 times daily     05/23/18 1401       Alden Hipp 05/23/18 1414    Margarita Grizzle, MD 05/23/18 1546

## 2018-05-23 NOTE — ED Triage Notes (Signed)
Pt c/o upper left dental pain x 3 weeks, recently in ICU and unable to make his dental appt for an extraction.  Pt is currently taking amoxicillin, x 4 days, without relief, and c/o worsening of symptoms/pain.

## 2018-05-26 ENCOUNTER — Other Ambulatory Visit: Payer: Self-pay | Admitting: Internal Medicine

## 2018-05-26 MED FILL — ALLOPURINOL 300 MG TAB: 300 | 30 days supply | Qty: 30 | Fill #0

## 2018-05-26 MED FILL — hydrOXYzine HCL 25 MG TABS: 25 | 7 days supply | Qty: 30 | Fill #1

## 2018-05-27 ENCOUNTER — Ambulatory Visit: Payer: Medicaid Other | Attending: Internal Medicine | Admitting: Pharmacist

## 2018-05-27 DIAGNOSIS — Z86711 Personal history of pulmonary embolism: Secondary | ICD-10-CM | POA: Insufficient documentation

## 2018-05-27 LAB — POCT INR: INR: 1.5 — AB (ref 2.0–3.0)

## 2018-05-28 ENCOUNTER — Other Ambulatory Visit: Payer: Self-pay | Admitting: Internal Medicine

## 2018-05-28 MED FILL — METOCLOPRAMIDE 10 MG TABLET: 10 | 3 days supply | Qty: 10 | Fill #0

## 2018-06-06 ENCOUNTER — Ambulatory Visit: Payer: Medicaid Other | Attending: Internal Medicine | Admitting: Internal Medicine

## 2018-06-06 ENCOUNTER — Encounter: Payer: Self-pay | Admitting: Internal Medicine

## 2018-06-06 VITALS — BP 129/84 | HR 79 | Temp 98.2°F | Ht 71.0 in | Wt 210.2 lb

## 2018-06-06 DIAGNOSIS — Z86711 Personal history of pulmonary embolism: Secondary | ICD-10-CM | POA: Insufficient documentation

## 2018-06-06 DIAGNOSIS — D649 Anemia, unspecified: Secondary | ICD-10-CM | POA: Insufficient documentation

## 2018-06-06 DIAGNOSIS — E785 Hyperlipidemia, unspecified: Secondary | ICD-10-CM | POA: Insufficient documentation

## 2018-06-06 DIAGNOSIS — Z0000 Encounter for general adult medical examination without abnormal findings: Secondary | ICD-10-CM | POA: Diagnosis present

## 2018-06-06 DIAGNOSIS — M109 Gout, unspecified: Secondary | ICD-10-CM | POA: Insufficient documentation

## 2018-06-06 DIAGNOSIS — N183 Chronic kidney disease, stage 3 (moderate): Secondary | ICD-10-CM | POA: Insufficient documentation

## 2018-06-06 DIAGNOSIS — M4802 Spinal stenosis, cervical region: Secondary | ICD-10-CM | POA: Insufficient documentation

## 2018-06-06 DIAGNOSIS — G8929 Other chronic pain: Secondary | ICD-10-CM | POA: Diagnosis not present

## 2018-06-06 DIAGNOSIS — K219 Gastro-esophageal reflux disease without esophagitis: Secondary | ICD-10-CM | POA: Diagnosis not present

## 2018-06-06 DIAGNOSIS — Z7901 Long term (current) use of anticoagulants: Secondary | ICD-10-CM | POA: Insufficient documentation

## 2018-06-06 DIAGNOSIS — K029 Dental caries, unspecified: Secondary | ICD-10-CM | POA: Diagnosis not present

## 2018-06-06 DIAGNOSIS — F419 Anxiety disorder, unspecified: Secondary | ICD-10-CM | POA: Insufficient documentation

## 2018-06-06 DIAGNOSIS — F1011 Alcohol abuse, in remission: Secondary | ICD-10-CM | POA: Diagnosis not present

## 2018-06-06 DIAGNOSIS — I2510 Atherosclerotic heart disease of native coronary artery without angina pectoris: Secondary | ICD-10-CM

## 2018-06-06 DIAGNOSIS — M542 Cervicalgia: Secondary | ICD-10-CM | POA: Diagnosis not present

## 2018-06-06 DIAGNOSIS — G40909 Epilepsy, unspecified, not intractable, without status epilepticus: Secondary | ICD-10-CM | POA: Diagnosis not present

## 2018-06-06 DIAGNOSIS — I129 Hypertensive chronic kidney disease with stage 1 through stage 4 chronic kidney disease, or unspecified chronic kidney disease: Secondary | ICD-10-CM | POA: Insufficient documentation

## 2018-06-06 DIAGNOSIS — Z86718 Personal history of other venous thrombosis and embolism: Secondary | ICD-10-CM | POA: Insufficient documentation

## 2018-06-06 DIAGNOSIS — R7303 Prediabetes: Secondary | ICD-10-CM

## 2018-06-06 DIAGNOSIS — Z79899 Other long term (current) drug therapy: Secondary | ICD-10-CM | POA: Diagnosis not present

## 2018-06-06 DIAGNOSIS — Z8673 Personal history of transient ischemic attack (TIA), and cerebral infarction without residual deficits: Secondary | ICD-10-CM | POA: Insufficient documentation

## 2018-06-06 DIAGNOSIS — F1410 Cocaine abuse, uncomplicated: Secondary | ICD-10-CM | POA: Diagnosis not present

## 2018-06-06 DIAGNOSIS — F329 Major depressive disorder, single episode, unspecified: Secondary | ICD-10-CM | POA: Diagnosis not present

## 2018-06-06 DIAGNOSIS — D72819 Decreased white blood cell count, unspecified: Secondary | ICD-10-CM | POA: Diagnosis not present

## 2018-06-06 DIAGNOSIS — R251 Tremor, unspecified: Secondary | ICD-10-CM | POA: Diagnosis not present

## 2018-06-06 DIAGNOSIS — I442 Atrioventricular block, complete: Secondary | ICD-10-CM | POA: Insufficient documentation

## 2018-06-06 DIAGNOSIS — Z23 Encounter for immunization: Secondary | ICD-10-CM | POA: Diagnosis not present

## 2018-06-06 DIAGNOSIS — E1122 Type 2 diabetes mellitus with diabetic chronic kidney disease: Secondary | ICD-10-CM | POA: Diagnosis not present

## 2018-06-06 LAB — GLUCOSE, POCT (MANUAL RESULT ENTRY): POC Glucose: 109 mg/dl — AB (ref 70–99)

## 2018-06-06 LAB — POCT GLYCOSYLATED HEMOGLOBIN (HGB A1C): HbA1c, POC (controlled diabetic range): 5.7 % (ref 0.0–7.0)

## 2018-06-06 NOTE — Progress Notes (Signed)
Patient ID: Husam Hohn, male    DOB: 1955/11/13  MRN: [id number]  CC: Annual Exam   Subjective: Russell Carter is a 62 y.o. male who presents for annual exam and preoperative evaluation to have tooth extractions under local anesthesia His concerns today include:  Pt with hx of pre-DM, HTN, HL, CAD,anxiety, gout, dep/anxiety  Since I last saw him patient was hospitalized twice last month.  First hospitalization was 7/6-08/2018 with loss of consciousness.  He was found to have second-degree heart block.  Cardiac catheterization revealed patent stent to the RCA.  Second hospitalization was 7/15-17/2019 with seizure.  Had been off Depakote.  He was discharged on Depakote.  He reports no further seizure activity.  Since discharge he also states that he has quit drinking completely.  He reports some fine tremors in both hands since hospital.  CAD/HTN/HL: Has appointment with his cardiologist Dr. Melburn Popper  06/27/2018.  No chest pains or shortness of breath.  He reports compliance with Crestor and Cozaar  Last eye exam was 11/2017.  Got 2 new pairs glasses  Chronic neck pain: Patient followed by pain specialist at [facility].  He is on Percocet through them.  Depression/anxiety/history of bipolar: Followed by a provider at [facility].  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  He is followed by our Coumadin clinic.  Patient with history of bilateral lower extremity DVT, left upper extremity DVT and PE in 01/2017 post cervical spine surgery.  He was placed on Xarelto.  He developed acute clot again in the left lower extremity 2 months later and was therefore changed to Coumadin.  HM: Due for flu and Tdap, had c-scope recently through [facility].  Last eye exam was 11/2017.  Got 2 new pairs glasses   Patient Active Problem List   Diagnosis Date Noted  . CKD (chronic kidney disease), stage III (HCC) 05/06/2018  . Seizure (HCC) 05/06/2018  . Normocytic anemia 05/06/2018  .  Leukopenia 05/06/2018  . AKI (acute kidney injury) (HCC)   . Acute kidney injury (HCC) 04/27/2018  . Abdominal pain 04/27/2018  . Chronic anticoagulation 04/27/2018  . Left knee pain 01/10/2018  . Numbness and tingling in both hands 07/31/2017  . Gastroesophageal reflux disease without esophagitis 07/31/2017  . Chronic pain disorder 06/05/2017  . History of pulmonary embolus (PE) 02/14/2017  . Diabetes mellitus type 2 in nonobese (HCC)   . Cocaine abuse (HCC)   . Depression   . History of CVA (cerebrovascular accident)   . Cervical spondylosis with radiculopathy 01/24/2017  . Dyslipidemia 03/15/2016  . Complete heart block (HCC) 03/14/2016  . Cervical spinal stenosis 03/14/2016  . CAD-50% LAD 2011 12/14/2014  . Gout 11/16/2014  . Polysubstance abuse (HCC) 01/15/2013  . DIZZINESS 06/05/2010  . Essential hypertension 05/18/2009     Current Outpatient Medications on File Prior to Visit  Medication Sig Dispense Refill  . acetaminophen (TYLENOL) 325 MG tablet Take 2 tablets (650 mg total) by mouth every 6 (six) hours as needed for mild pain (or Fever >/= 101). 30 tablet 1  . acetaminophen (TYLENOL) 500 MG tablet Take 1,000 mg by mouth every 6 (six) hours as needed for headache (pain).    Marland Kitchen allopurinol (ZYLOPRIM) 300 MG tablet TAKE 1 TABLET (300 MG TOTAL) BY MOUTH DAILY. 30 tablet 2  . amoxicillin (AMOXIL) 500 MG capsule Take 1 capsule (500 mg total) by mouth 3 (three) times daily. 30 capsule 0  . Calcium Carb-Cholecalciferol (CALCIUM-VITAMIN D) 600-400 MG-UNIT  TABS Take 1 tablet by mouth daily. 30 tablet 5  . clotrimazole (LOTRIMIN) 1 % cream Apply 1 application topically 2 (two) times daily.    . divalproex (DEPAKOTE) 500 MG DR tablet Take 1 tablet (500 mg total) by mouth 2 (two) times daily. 60 tablet 5  . DULoxetine (CYMBALTA) 30 MG capsule Take 1 capsule (30 mg total) by mouth 2 (two) times daily. 60 capsule 5  . folic acid (FOLVITE) 1 MG tablet Take 1 tablet (1 mg total) by mouth  daily. 30 tablet 5  . gabapentin (NEURONTIN) 400 MG capsule Take 1 capsule (400 mg total) by mouth 2 (two) times daily. 180 capsule 3  . hydrOXYzine (ATARAX/VISTARIL) 25 MG tablet Take 1 tablet (25 mg total) by mouth every 6 (six) hours as needed. for anxiety 30 tablet 2  . LORazepam (ATIVAN) 0.5 MG tablet Take 1 tablet (0.5 mg total) by mouth every 8 (eight) hours as needed for anxiety. 12 tablet 0  . losartan (COZAAR) 100 MG tablet Take 1 tablet (100 mg total) by mouth daily. 30 tablet 5  . meclizine (ANTIVERT) 25 MG tablet Take 1 tablet (25 mg total) by mouth daily as needed for dizziness or nausea (vertigo). 30 tablet 0  . methocarbamol (ROBAXIN) 750 MG tablet Take 1 tablet (750 mg total) by mouth every 8 (eight) hours as needed for muscle spasms. 90 tablet 1  . metoCLOPramide (REGLAN) 10 MG tablet TAKE 1 TABLET BY MOUTH EVERY 6 HOURS AS NEEDED FOR NAUSEA OR HEADACHE. 10 tablet 0  . Multiple Vitamin (MULTIVITAMIN WITH MINERALS) TABS tablet Take 1 tablet by mouth daily. 30 tablet 5  . omeprazole (PRILOSEC) 20 MG capsule Take 20 mg by mouth daily.  3  . ondansetron (ZOFRAN) 4 MG tablet Take 1 tablet (4 mg total) by mouth every 6 (six) hours as needed for nausea. 20 tablet 0  . oxyCODONE-acetaminophen (PERCOCET) 7.5-325 MG tablet Take 1 tablet by mouth every 4 (four) hours as needed for severe pain.    Bertram Gala Glycol-Propyl Glycol (SYSTANE OP) Place 1 drop into both eyes 2 (two) times daily.    . rosuvastatin (CRESTOR) 40 MG tablet Take 1 tablet (40 mg total) by mouth daily. 30 tablet 5  . traZODone (DESYREL) 100 MG tablet Take 1 tablet (100 mg total) by mouth at bedtime as needed for sleep. 30 tablet 5  . warfarin (COUMADIN) 5 MG tablet Take 1 tablet (5 mg total) by mouth daily. 30 tablet 3  . nitroGLYCERIN (NITROSTAT) 0.4 MG SL tablet Place 1 tablet (0.4 mg total) under the tongue every 5 (five) minutes as needed for chest pain. (Patient not taking: Reported on 04/11/2018) 25 tablet 4  .  pantoprazole (PROTONIX) 40 MG tablet Take 1 tablet (40 mg total) by mouth at bedtime. (Patient not taking: Reported on 04/28/2018) 30 tablet 1  . [DISCONTINUED] famotidine (PEPCID) 20 MG tablet Take 1 tablet (20 mg total) by mouth 2 (two) times daily. 30 tablet 0  . [DISCONTINUED] TACRINE HYDROCHLORIDE PO Take 25 mg by mouth daily.       No current facility-administered medications on file prior to visit.     Allergies  Allergen Reactions  . Shellfish Allergy Anaphylaxis    Social History   Socioeconomic History  . Marital status: Widowed    Spouse name: Not on file  . Number of children: Not on file  . Years of education: Not on file  . Highest education level: Not on file  Occupational History  .  Not on file  Social Needs  . Financial resource strain: Not on file  . Food insecurity:    Worry: Not on file    Inability: Not on file  . Transportation needs:    Medical: Not on file    Non-medical: Not on file  Tobacco Use  . Smoking status: Never Smoker  . Smokeless tobacco: Never Used  Substance and Sexual Activity  . Alcohol use: Yes    Comment: 16 oz beer every 2-3 days  . Drug use: Yes    Types: "Crack" cocaine, Marijuana, Cocaine    Comment:  "quit 2008"  . Sexual activity: Not Currently  Lifestyle  . Physical activity:    Days per week: Not on file    Minutes per session: Not on file  . Stress: Not on file  Relationships  . Social connections:    Talks on phone: Not on file    Gets together: Not on file    Attends religious service: Not on file    Active member of club or organization: Not on file    Attends meetings of clubs or organizations: Not on file    Relationship status: Not on file  . Intimate partner violence:    Fear of current or ex partner: Not on file    Emotionally abused: Not on file    Physically abused: Not on file    Forced sexual activity: Not on file  Other Topics Concern  . Not on file  Social History Narrative   Lives in [place]  with his wife and son.     Family History  Problem Relation Age of Onset  . Heart attack Mother        x7. Still alive  . Heart attack Father        deceased b/c of MI  . Heart attack Brother        older, deceased b/c of MI   . Cancer Brother        bone cancer in 1 brother, ? brain cancer in other brother    Past Surgical History:  Procedure Laterality Date  . ANTERIOR CERVICAL DECOMP/DISCECTOMY FUSION N/A 01/24/2017   Procedure: Cervical Four-Five,Cervical Five Six,Cervical Six-Seven Anterior cervical discectomy with fusion and plate with Cervical Three-Thoracic- One Laminectomy/Foraminotomy with Cervical Three-Thoracic- One Fixation and fusion;  Surgeon: Loura Halt Ditty, MD;  Location: [facility] OR;  Service: Neurosurgery;  Laterality: N/A;  Part-1 anterior approach  . CARDIAC CATHETERIZATION  2008; ~ 2011   [place]-40%lad  . CARDIAC CATHETERIZATION N/A 03/16/2016   Procedure: Left Heart Cath and Coronary Angiography;  Surgeon: Marykay Lex, MD;  Location: [facility] INVASIVE CV LAB;  Service: Cardiovascular;  Laterality: N/A;  . COLONOSCOPY WITH PROPOFOL N/A 04/17/2018   Procedure: COLONOSCOPY WITH PROPOFOL;  Surgeon: Graylin Shiver, MD;  Location: [facility] ENDOSCOPY;  Service: Endoscopy;  Laterality: N/A;  . CORONARY ANGIOPLASTY     STENTS  . EYE SURGERY Bilateral    "for double vision"  . INGUINAL HERNIA REPAIR  05/12/2012   Procedure: HERNIA REPAIR INGUINAL ADULT;  Surgeon: Liz Malady, MD;  Location: [facility];  Service: General;  Laterality: Right;  . INGUINAL HERNIA REPAIR Left 02/02/2003   Dr. Violeta Gelinas. repair with mesh. Same procedure done again in  05/22/2004   . INGUINAL HERNIA REPAIR Left ?date 2nd OR  . LEFT HEART CATH AND CORONARY ANGIOGRAPHY N/A 04/29/2018   Procedure: LEFT HEART CATH AND CORONARY ANGIOGRAPHY;  Surgeon: Swaziland, Peter M,  MD;  Location: [facility] INVASIVE CV LAB;  Service: Cardiovascular;  Laterality: N/A;  . POSTERIOR CERVICAL FUSION/FORAMINOTOMY  N/A 01/24/2017   Procedure: Cervical Three-Thoracic One-Laminectomy/Foraminotomy with Cervical Three-Thoracic One Fixation and fusion;  Surgeon: Loura Halt Ditty, MD;  Location: [facility] OR;  Service: Neurosurgery;  Laterality: N/A;  Part-2 Posterior approach  . RECONSTRUCT / STABILIZE DISTAL ULNA    . SHOULDER OPEN ROTATOR CUFF REPAIR Left 2010  . SHOULDER SURGERY Left    "injured on the job; had to cut small bone out"  . THUMB FUSION Right     ROS: Review of Systems  Constitutional: Negative for activity change and fatigue.  HENT: Positive for dental problem.   Respiratory: Negative for shortness of breath.   Cardiovascular: Negative for palpitations and leg swelling.  Gastrointestinal: Negative for abdominal distention.  Psychiatric/Behavioral: Negative for dysphoric mood.    PHYSICAL EXAM: BP 129/84   Pulse 79   Temp 98.2 F (36.8 C) (Oral)   Ht 5\' 11"  (1.803 m)   Wt 210 lb 3.2 oz (95.3 kg)   SpO2 95%   BMI 29.32 kg/m   Physical Exam  General appearance - alert, well appearing, older African-American male  in no distress Mental status - normal mood, behavior, speech, dress, motor activity, and thought processes Nose - normal and patent, no erythema, discharge or polyps Mouth - mucous membranes moist, pharynx normal without lesions.  2 cavities noted in the upper jaw one being the second molar left upper jaw. Neck -  no significant adenopathy, no thyroid enlargement. Lymphatics -no cervical axillary lymphadenopathy Chest - clear to auscultation, no wheezes, rales or rhonchi, symmetric air entry Heart - normal rate, regular rhythm, normal S1, S2, no murmurs, rubs, clicks or gallops Abdomen - soft, nontender, nondistended, no masses or organomegaly Neurological -cranial nerves grossly intact.  Power in the right upper extremity 5/5.  Left upper extremity 4+/5 proximal and distal.  Power in the lower extremities 5/5 bilaterally.  Gross and fine sensation intact.  Gait is normal.   Very fine tremor of hands noted on outstretched arms Musculoskeletal -some arthritis changes noted in the hands. Extremities - peripheral pulses normal, no pedal edema, no clubbing or cyanosis  Results for orders placed or performed in visit on 06/06/18  POCT glucose (manual entry)  Result Value Ref Range   POC Glucose 109 (A) 70 - 99 mg/dl  POCT glycosylated hemoglobin (Hb A1C)  Result Value Ref Range   Hemoglobin A1C     HbA1c POC (<> result, manual entry)     HbA1c, POC (prediabetic range)     HbA1c, POC (controlled diabetic range) 5.7 0.0 - 7.0 %    ASSESSMENT AND PLAN: 1. Annual physical exam We will get patient up-to-date with recommended vaccines.   2. Seizure disorder [facility]) Neurology referral - Valproic acid level - Comprehensive metabolic panel  3. Dental cavities I think from my standpoint we can go ahead and clear him for his teeth extractions.  Once he has a date set he will let us know so that we can tell him how to adjust his Coumadin to get his INR down prior to the procedure. -will send message to his cardiologist  4. Coronary artery disease involving native coronary artery of native heart without angina pectoris Stable.  5. Prediabetes Encourage healthy eating and regular exercise. - POCT glucose (manual entry) - POCT glycosylated hemoglobin (Hb A1C)  6. 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  Encourage him to maintain  abstinence.  7. Tremor of both hands Check Depakote level today.  8. Need for Tdap vaccination   9. Need for immunization against influenza - Flu Vaccine QUAD 36+ mos IM  10.  History of DVT/PE Given the extent of the clots that he had and the reoccurrence he will need lifelong anticoagulation  Patient was given the opportunity to ask questions.  Patient verbalized understanding of the plan and was able to repeat key elements of the plan.   Orders Placed This Encounter  Procedures  . POCT glucose  (manual entry)  . POCT glycosylated hemoglobin (Hb A1C)     Requested Prescriptions    No prescriptions requested or ordered in this encounter    No follow-ups on file.  Jonah Blue, MD, FACP

## 2018-06-06 NOTE — Patient Instructions (Signed)
Instructions from [facility]:  INR today 1.6. Please increase warfarin as follows:   Take 1/2 tablet on Mondays, Wednesdays, and Fridays with 1 tablet all other days. I will see you the last week of Aug.

## 2018-06-07 LAB — COMPREHENSIVE METABOLIC PANEL WITH GFR
ALT: 22 IU/L (ref 0–44)
AST: 20 IU/L (ref 0–40)
Albumin/Globulin Ratio: 1.6 (ref 1.2–2.2)
Albumin: 4.3 g/dL (ref 3.6–4.8)
Alkaline Phosphatase: 54 IU/L (ref 39–117)
BUN/Creatinine Ratio: 16 (ref 10–24)
BUN: 18 mg/dL (ref 8–27)
Bilirubin Total: <0.2 mg/dL (ref 0.0–1.2)
CO2: 20 mmol/L (ref 20–29)
Calcium: 9.7 mg/dL (ref 8.6–10.2)
Chloride: 110 mmol/L — ABNORMAL HIGH (ref 96–106)
Creatinine, Ser: 1.13 mg/dL (ref 0.76–1.27)
GFR calc Af Amer: 80 mL/min/1.73 (ref >59)
GFR calc non Af Amer: 69 mL/min/1.73 (ref >59)
Globulin, Total: 2.7 g/dL (ref 1.5–4.5)
Glucose: 91 mg/dL (ref 65–99)
Potassium: 5.3 mmol/L — ABNORMAL HIGH (ref 3.5–5.2)
Sodium: 146 mmol/L — ABNORMAL HIGH (ref 134–144)
Total Protein: 7 g/dL (ref 6.0–8.5)

## 2018-06-07 LAB — VALPROIC ACID LEVEL: Valproic Acid Lvl: 62 ug/mL (ref 50–100)

## 2018-06-08 ENCOUNTER — Telehealth: Payer: Self-pay | Admitting: Internal Medicine

## 2018-06-08 DIAGNOSIS — E875 Hyperkalemia: Secondary | ICD-10-CM

## 2018-06-08 NOTE — Telephone Encounter (Signed)
PC placed to pt today to go over lab results.  Left message on VM informing him that his potassium ad Na+ levels are elevated.  I would like to repeat K+ level before making any adjustments in med (Losartan). Advise to limit consumption of potassium rich foods like OJ, oranges and bananas.  Advise to drink more water over next several days to get Na+ down. Advise to return to lab tomorrow.

## 2018-06-12 ENCOUNTER — Other Ambulatory Visit: Payer: Self-pay | Admitting: Internal Medicine

## 2018-06-12 DIAGNOSIS — R519 Headache, unspecified: Secondary | ICD-10-CM

## 2018-06-12 DIAGNOSIS — R51 Headache: Principal | ICD-10-CM

## 2018-06-12 MED FILL — OMEPRAZOLE 20 MG CAP: 20 | 30 days supply | Qty: 30 | Fill #3

## 2018-06-12 MED FILL — ROSUVASTATIN CALCIUM 40 MG: 40 | 90 days supply | Qty: 90 | Fill #1

## 2018-06-12 MED FILL — FOLIC ACID 1 MG TABS: 1 | 30 days supply | Qty: 30 | Fill #1

## 2018-06-12 MED FILL — DIVALPROEX SOD DR 500 MG TA: 500 | 30 days supply | Qty: 60 | Fill #1

## 2018-06-12 MED FILL — LOSARTAN POTASSIUM 100 MG T: 100 | 90 days supply | Qty: 90 | Fill #1

## 2018-06-12 MED FILL — DULoxetine HCL 30 MG CPEP: 30 | 90 days supply | Qty: 180 | Fill #1

## 2018-06-12 MED FILL — GABAPENTIN 400 MG CAPSULE: 400 | 30 days supply | Qty: 60 | Fill #1

## 2018-06-12 MED FILL — traZODone HCL 100 MG TABS: 100 | 30 days supply | Qty: 30 | Fill #1

## 2018-06-12 MED FILL — METHOCARBAMOL 750 MG TABS: 750 | 30 days supply | Qty: 90 | Fill #1

## 2018-06-13 MED ORDER — WARFARIN SODIUM 5 MG PO TABS: ORAL_TABLET | ORAL | 3 refills | Status: DC

## 2018-06-13 MED ORDER — MECLIZINE HCL 25 MG PO TABS: 25.0000 mg | ORAL_TABLET | Freq: Every day | ORAL | 0 refills | Status: DC | PRN

## 2018-06-13 MED FILL — METOCLOPRAMIDE 10 MG TABLET: 10 | 3 days supply | Qty: 10 | Fill #0

## 2018-06-13 MED FILL — MECLIZINE 25 MG TABLET: 25 | 15 days supply | Qty: 15 | Fill #0

## 2018-06-18 ENCOUNTER — Encounter: Payer: Self-pay | Admitting: Physical Therapy

## 2018-06-18 ENCOUNTER — Ambulatory Visit: Payer: Medicaid Other | Attending: Internal Medicine | Admitting: Pharmacist

## 2018-06-18 ENCOUNTER — Ambulatory Visit: Payer: Medicaid Other | Attending: Neurosurgery | Admitting: Physical Therapy

## 2018-06-18 ENCOUNTER — Ambulatory Visit: Payer: Medicaid Other

## 2018-06-18 DIAGNOSIS — M79602 Pain in left arm: Secondary | ICD-10-CM | POA: Insufficient documentation

## 2018-06-18 DIAGNOSIS — G540 Brachial plexus disorders: Secondary | ICD-10-CM | POA: Diagnosis not present

## 2018-06-18 DIAGNOSIS — M6281 Muscle weakness (generalized): Secondary | ICD-10-CM | POA: Insufficient documentation

## 2018-06-18 DIAGNOSIS — Z86711 Personal history of pulmonary embolism: Secondary | ICD-10-CM

## 2018-06-18 DIAGNOSIS — M542 Cervicalgia: Secondary | ICD-10-CM | POA: Insufficient documentation

## 2018-06-18 DIAGNOSIS — R278 Other lack of coordination: Secondary | ICD-10-CM | POA: Insufficient documentation

## 2018-06-18 DIAGNOSIS — M4722 Other spondylosis with radiculopathy, cervical region: Secondary | ICD-10-CM | POA: Diagnosis present

## 2018-06-18 DIAGNOSIS — E875 Hyperkalemia: Secondary | ICD-10-CM | POA: Diagnosis not present

## 2018-06-18 DIAGNOSIS — M79601 Pain in right arm: Secondary | ICD-10-CM | POA: Insufficient documentation

## 2018-06-18 LAB — POCT INR: INR: 1.4 — AB (ref 2.0–3.0)

## 2018-06-18 MED FILL — WARFARIN SODIUM 5 MG TABLET: 5 | 30 days supply | Qty: 30 | Fill #0

## 2018-06-18 NOTE — Progress Notes (Signed)
Patient here for lab visit only 

## 2018-06-18 NOTE — Therapy (Signed)
[facility] [address] [place], [place], [address] Phone: [phone]   Fax:  [phone]  Physical Therapy Evaluation  Patient Details  Name: Russell Carter MRN: [id number] Date of Birth: 21-Aug-1956 Referring Provider: Celedonio Miyamoto, PA   Encounter Date: 06/18/2018  PT End of Session - 06/18/18 1329    Visit Number  1    Number of Visits  13    Date for PT Re-Evaluation  08/18/18    Authorization Type  MCD auth submitted 06/18/18 for 12 visits, Pt was previously seen 13 visits from 01/15/18 - 03/26/18.     Activity Tolerance  Patient tolerated treatment well;Patient limited by pain    Behavior During Therapy  [facility] for tasks assessed/performed       Past Medical History:  Diagnosis Date  . Acute kidney injury (HCC) 04/27/2018  . AKI (acute kidney injury) (HCC)   . Anginal pain (HCC)   . Anxiety   . Arthritis   . Bipolar disorder (HCC)   . Blood in stool   . CAD (coronary artery disease)    a. 2011: cath showing mod non-obstructive disease b. 02/2016: cath with 95% stenosis in the Mid RCA. DES placed.  . Cervical stenosis of spine   . Chronic anticoagulation 04/27/2018  . Chronic pain disorder 06/05/2017  . CKD (chronic kidney disease), stage III (HCC) 05/06/2018  . Cocaine abuse (HCC)    history  . Complete heart block (HCC) 03/14/2016  . Depression   . Diabetes mellitus type 2 in nonobese (HCC)   . Diabetes mellitus without complication (HCC)   . Dyslipidemia 03/15/2016  . Essential hypertension 05/18/2009   Qualifier: Diagnosis of  By: Kem Parkinson    . Gastroesophageal reflux disease without esophagitis 07/31/2017  . Gout, unspecified   . Headache   . History of CVA (cerebrovascular accident)   . History of pulmonary embolus (PE) 02/14/2017  . HTN (hypertension)   . Hypercholesterolemia   . Hyperglycemia   . Leukopenia 05/06/2018  . Migraines   . Myocardial infarction [facility]) 2008   drug cocaine and marijuana at time  of mi none  since    . Normocytic anemia 05/06/2018  . Pneumonia   . Polysubstance abuse (HCC) 01/15/2013  . Rectal bleeding   . Renal insufficiency   . Seizure (HCC) 05/06/2018  . Seizures (HCC)   . Stroke [facility])     Past Surgical History:  Procedure Laterality Date  . ANTERIOR CERVICAL DECOMP/DISCECTOMY FUSION N/A 01/24/2017   Procedure: Cervical Four-Five,Cervical Five Six,Cervical Six-Seven Anterior cervical discectomy with fusion and plate with Cervical Three-Thoracic- One Laminectomy/Foraminotomy with Cervical Three-Thoracic- One Fixation and fusion;  Surgeon: Loura Halt Ditty, MD;  Location: [facility] OR;  Service: Neurosurgery;  Laterality: N/A;  Part-1 anterior approach  . CARDIAC CATHETERIZATION  2008; ~ 2011   [place]-40%lad  . CARDIAC CATHETERIZATION N/A 03/16/2016   Procedure: Left Heart Cath and Coronary Angiography;  Surgeon: Marykay Lex, MD;  Location: [facility] INVASIVE CV LAB;  Service: Cardiovascular;  Laterality: N/A;  . COLONOSCOPY WITH PROPOFOL N/A 04/17/2018   Procedure: COLONOSCOPY WITH PROPOFOL;  Surgeon: Graylin Shiver, MD;  Location: [facility] ENDOSCOPY;  Service: Endoscopy;  Laterality: N/A;  . CORONARY ANGIOPLASTY     STENTS  . EYE SURGERY Bilateral    "for double vision"  . INGUINAL HERNIA REPAIR  05/12/2012   Procedure: HERNIA REPAIR INGUINAL ADULT;  Surgeon: Liz Malady, MD;  Location: [facility];  Service: General;  Laterality: Right;  .  INGUINAL HERNIA REPAIR Left 02/02/2003   Dr. Violeta Gelinas. repair with mesh. Same procedure done again in  05/22/2004   . INGUINAL HERNIA REPAIR Left ?date 2nd OR  . LEFT HEART CATH AND CORONARY ANGIOGRAPHY N/A 04/29/2018   Procedure: LEFT HEART CATH AND CORONARY ANGIOGRAPHY;  Surgeon: Swazil[place], Peter M, MD;  Location: [facility] INVASIVE CV LAB;  Service: Cardiovascular;  Laterality: N/A;  . POSTERIOR CERVICAL FUSION/FORAMINOTOMY N/A 01/24/2017   Procedure: Cervical Three-Thoracic One-Laminectomy/Foraminotomy with Cervical  Three-Thoracic One Fixation and fusion;  Surgeon: Loura Halt Ditty, MD;  Location: [facility] OR;  Service: Neurosurgery;  Laterality: N/A;  Part-2 Posterior approach  . RECONSTRUCT / STABILIZE DISTAL ULNA    . SHOULDER OPEN ROTATOR CUFF REPAIR Left 2010  . SHOULDER SURGERY Left    "injured on the job; had to cut small bone out"  . THUMB FUSION Right     There were no vitals filed for this visit.   Subjective Assessment - 06/18/18 1411    Subjective  Pt arriving to therapy for evaluation complaining of cervical pain and L shoulder pain. Pt reporting numbness down left UE and intermittently in R UE. pt reporting pain of 8/10 at present and reports having difficulty with ADL's and household tasks. Pt also reporting difficulty sleeping.     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   I can't, I have to constantly change positions    How long can you stand comfortably?  5-10 minutes    How long can you walk comfortably?  10-20 minutes    Diagnostic tests  MRI    Patient Stated Goals  Be able to use my Left Arm for lifting, reaching, cooking, and ADL's    Currently in Pain?  Yes    Pain Score  8     Pain Location  Shoulder    Pain Orientation  Left    Pain Descriptors / Indicators  Aching    Pain Type  Chronic pain    Pain Onset  More than a month ago    Pain Frequency  Constant    Aggravating Factors   sleeping, ADL's, household chores    Pain Relieving Factors  DN has helped before, some of the exericses     Multiple Pain Sites  Yes    Pain Score  8    Pain Location  Neck    Pain Descriptors / Indicators  Aching;Tingling;Discomfort;Tightness    Pain Type  Chronic pain    Pain Onset  More than a month ago    Pain Frequency  Constant         [facility] PT Assessment - 06/18/18 0001      Assessment   Medical Diagnosis  cervical spondylosis with radiculopahy cervical region, L shoulder pain    Referring Provider   Celedonio Miyamoto, PA    Onset Date/Surgical Date  --   April 2018   Hand Dominance  Right    Prior Therapy  yes for shoulder 13 visits      Precautions   Precautions  None      Restrictions   Weight Bearing Restrictions  No      Balance Screen   Has the patient fallen in the past 6 months  No    Has the patient had a decrease in activity level because of a fear of falling?   Yes    Is the patient reluctant to  leave their home because of a fear of falling?   No      Home Environment   Living Environment  Private residence    Living Arrangements  Spouse/significant other    Additional Comments  one step to enter      Prior Function   Level of Independence  Needs assistance with ADLs;Needs assistance with homemaking      Cognition   Overall Cognitive Status  Within Functional Limits for tasks assessed      Observation/Other Assessments   Focus on Therapeutic Outcomes (FOTO)   clinical assessments      Posture/Postural Control   Posture/Postural Control  Postural limitations    Postural Limitations  Rounded Shoulders;Forward head    Posture Comments  stiffness noted in pt's cervical spine, head tilted upward      ROM / Strength   AROM / PROM / Strength  AROM      AROM   Overall AROM   Deficits    AROM Assessment Site  Shoulder;Cervical    Left Shoulder Flexion  50 Degrees    Left Shoulder ABduction  46 Degrees    Cervical Flexion  10    Cervical Extension  5    Cervical - Right Side Bend  12    Cervical - Left Side Bend  14    Cervical - Right Rotation  30    Cervical - Left Rotation  10      Strength   Right/Left Shoulder  Right;Left    Right Shoulder Flexion  4/5    Right Shoulder ABduction  4-/5    Right Shoulder Internal Rotation  4-/5    Right Shoulder External Rotation  4-/5    Left Shoulder Flexion  3-/5    Left Shoulder ABduction  3-/5    Left Shoulder Internal Rotation  3-/5    Left Shoulder External Rotation  3-/5    Right Hand Grip (lbs)  75    Left  Hand Grip (lbs)  45      Palpation   Palpation comment  tenderness to palpation in the upper trap and levator                Objective measurements completed on examination: See above findings.              PT Education - 06/18/18 1328    Education provided  Yes    Education Details  created new HEP    Person(s) Educated  Patient    Methods  Explanation;Demonstration;Handout    Comprehension  Verbalized understanding;Returned demonstration       PT Short Term Goals - 06/18/18 1352      PT SHORT TERM GOAL #1   Title  Pt will be independent in his HEP and postural correction.     Baseline  issued new HEP 06/18/18    Time  3    Period  Weeks    Status  New    Target Date  07/18/18        PT Long Term Goals - 06/18/18 1357      PT LONG TERM GOAL #1   Title  Pt will report pain </= 5/10 with ADL's.     Baseline  currently pain 8/10    Time  6    Period  Weeks    Status  New    Target Date  08/15/18      PT LONG TERM GOAL #2   Title  Pt  will improve cervical rotation by >/= 15 degrees to each direction.     Baseline  R rotation: 10 degrees, L rotation 30 degrees    Time  6    Period  Weeks    Status  --    Target Date  08/15/18             Plan - 06/18/18 1340    Clinical Impression Statement  Pt arriving to PT for evaluation with dx of spondylosis with radiculopathy, cerivcal region. 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 Pt with history of ACDF C4-7. Pt also complaining of 8/10 pain in his left neck and shoulders. Pt with limited ROM of L shoulder and weakness noted which affects pt's ability to perform ADL's. Pt also reporting numbness down left UE into digits 3-5 at times and increased swelling. Pt also reporting numbness in R UE which improved following exericses.  Pt was issued a HEP. Skilled PT needed to progress pt toward imporved ROM/ Strength and functional  mobility.     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, depression, h/o falls,     Clinical Presentation  Stable    Clinical Decision Making  Low    Rehab Potential  Fair    PT Frequency  2x / week    PT Duration  4 weeks    PT Treatment/Interventions  ADLs/Self Care Home Management;Cryotherapy;Electrical Stimulation;Ultrasound;Moist Heat;Iontophoresis 4mg /ml Dexamethasone;Therapeutic activities;Therapeutic exercise;Patient/family education;Manual techniques;Scar mobilization;Passive range of motion;Taping;Dry needling;Functional mobility training    PT Next Visit Plan  cervical ROM, shoulder ROM, STW, modalities as needed, DN has worked in the past    PT Home Exercise Plan  shoulder flexion, cervical rotation, cervical retraction    Consulted and Agree with Plan of Care  Patient       Patient will benefit from skilled therapeutic intervention in order to improve the following deficits and impairments:  Pain, Improper body mechanics, Postural dysfunction, Increased muscle spasms, Decreased activity tolerance, Decreased range of motion, Decreased strength, Impaired UE functional use  Visit Diagnosis: 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 CKD (chronic kidney disease), stage III (HCC) 05/06/2018  . Seizure (HCC) 05/06/2018  . Normocytic anemia 05/06/2018  . Leukopenia 05/06/2018  . AKI (acute kidney injury) (HCC)   . Acute kidney injury (HCC) 04/27/2018  . Abdominal pain 04/27/2018  . Chronic anticoagulation 04/27/2018  . Left knee pain 01/10/2018  . Numbness and tingling in both hands 07/31/2017  . Gastroesophageal reflux disease without esophagitis  07/31/2017  . Chronic pain disorder 06/05/2017  . History of pulmonary embolus (PE) 02/14/2017  . Diabetes mellitus type 2 in nonobese (HCC)   . Cocaine abuse (HCC)   . Depression   . History of CVA (cerebrovascular accident)   . Cervical spondylosis with radiculopathy 01/24/2017  . Dyslipidemia 03/15/2016  . Complete heart block (HCC) 03/14/2016  . Cervical spinal stenosis 03/14/2016  . CAD-50% LAD 2011 12/14/2014  . Gout 11/16/2014  . Polysubstance abuse (HCC) 01/15/2013  . DIZZINESS 06/05/2010  . Essential hypertension 05/18/2009    Sharmon Leyden, MPT 06/18/2018, 2:24 PM  [facility] [address] [place], [place], [address] Phone: [phone]   Fax:  [phone]  Name: Russell Carter MRN: [id number] Date of Birth: 04-09-1956

## 2018-06-19 LAB — BASIC METABOLIC PANEL
BUN / CREAT RATIO: 12 (ref 10–24)
BUN: 13 mg/dL (ref 8–27)
CHLORIDE: 102 mmol/L (ref 96–106)
CO2: 21 mmol/L (ref 20–29)
Calcium: 9.2 mg/dL (ref 8.6–10.2)
Creatinine, Ser: 1.12 mg/dL (ref 0.76–1.27)
GFR calc Af Amer: 80 mL/min/{1.73_m2} (ref >59)
GFR calc non Af Amer: 70 mL/min/{1.73_m2} (ref >59)
GLUCOSE: 133 mg/dL — AB (ref 65–99)
POTASSIUM: 4.2 mmol/L (ref 3.5–5.2)
Sodium: 141 mmol/L (ref 134–144)

## 2018-06-20 ENCOUNTER — Telehealth: Payer: Self-pay

## 2018-06-20 NOTE — Telephone Encounter (Signed)
Contacted pt to go over lab results pt is aware and doesn't have any questions or concerns 

## 2018-06-25 ENCOUNTER — Encounter: Payer: Self-pay | Admitting: Physical Therapy

## 2018-06-25 ENCOUNTER — Ambulatory Visit: Payer: Medicaid Other | Attending: Family Medicine | Admitting: Pharmacist

## 2018-06-25 ENCOUNTER — Ambulatory Visit: Payer: Medicaid Other | Attending: Neurosurgery | Admitting: Physical Therapy

## 2018-06-25 DIAGNOSIS — M79601 Pain in right arm: Secondary | ICD-10-CM | POA: Insufficient documentation

## 2018-06-25 DIAGNOSIS — M4722 Other spondylosis with radiculopathy, cervical region: Secondary | ICD-10-CM | POA: Diagnosis present

## 2018-06-25 DIAGNOSIS — M79602 Pain in left arm: Secondary | ICD-10-CM | POA: Diagnosis present

## 2018-06-25 DIAGNOSIS — R278 Other lack of coordination: Secondary | ICD-10-CM | POA: Insufficient documentation

## 2018-06-25 DIAGNOSIS — Z86711 Personal history of pulmonary embolism: Secondary | ICD-10-CM | POA: Diagnosis not present

## 2018-06-25 DIAGNOSIS — M6281 Muscle weakness (generalized): Secondary | ICD-10-CM | POA: Insufficient documentation

## 2018-06-25 DIAGNOSIS — M542 Cervicalgia: Secondary | ICD-10-CM | POA: Diagnosis present

## 2018-06-25 DIAGNOSIS — R29818 Other symptoms and signs involving the nervous system: Secondary | ICD-10-CM | POA: Diagnosis present

## 2018-06-25 DIAGNOSIS — G540 Brachial plexus disorders: Secondary | ICD-10-CM | POA: Diagnosis not present

## 2018-06-25 LAB — POCT INR: INR: 4 — AB (ref 2.0–3.0)

## 2018-06-25 MED FILL — hydrOXYzine HCL 25 MG TABS: 25 | 7 days supply | Qty: 30 | Fill #2

## 2018-06-25 MED FILL — ALLOPURINOL 300 MG TAB: 300 | 30 days supply | Qty: 30 | Fill #1

## 2018-06-25 NOTE — Progress Notes (Signed)
Cardiology Office Note   Date:  06/27/2018   ID:  [name], [name] 1956-10-14, MRN [id number]  PCP:  Russell Matar, MD  Cardiologist:  Dr. Eden Carter    Chief Complaint  Patient presents with  . Hospitalization Follow-up  . Coronary Artery Disease      History of Present Illness: Russell Carter is a 62 y.o. male who presents for hospital follow up from hospital for abd pain and bradycardia --nonobstructive disease patent stents.  Mildly elevated LVEDP.  04/29/18  F/U CAD and HTN. He has a history of CVA, PE and requires lovenox bridging. Most recently seen by pharmacist, HTN clinic and NP. May 2017 had DES to RCA normal EF  Subsequently required C3-T1 laminectomy and fusion. Previous cocaine use. Insurance issues and frequently runs Out of meds.    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    Hospitalization in July for abd pain and chest pain.  As above with patent stent on cath.  Most likely due to weed eating (muscular skeletal) with partial paralysis of Lt arm he uses upper lt body to weed eat.  He only has pain now with the weed eating.  He was hospitalized in July with a seizure.  Now on depakote.  His prior abd pain was related to ETOH and he has since stopped.   He also had AKI with July admit but last BMP with Cr of 1.12.   He is on coumadin and is followed by PCP for PE.   Past Medical History:  Diagnosis Date  . Acute kidney injury (HCC) 04/27/2018  . AKI (acute kidney injury) (HCC)   . Anginal pain (HCC)   . Anxiety   . Arthritis   . Bipolar disorder (HCC)   . Blood in stool   . CAD (coronary artery disease)    a. 2011: cath showing mod  non-obstructive disease b. 02/2016: cath with 95% stenosis in the Mid RCA. DES placed.  . Cervical stenosis of spine   . Chronic anticoagulation 04/27/2018  . Chronic pain disorder 06/05/2017  . CKD (chronic kidney disease), stage III (HCC) 05/06/2018  . Cocaine abuse (HCC)    history  . Complete heart block (HCC) 03/14/2016  . Depression   . Diabetes mellitus type 2 in nonobese (HCC)   . Diabetes mellitus without complication (HCC)   . Dyslipidemia 03/15/2016  . Essential hypertension 05/18/2009   Qualifier: Diagnosis of  By: Kem Parkinson    . Gastroesophageal reflux disease without esophagitis 07/31/2017  . Gout, unspecified   . Headache   . History of CVA (cerebrovascular accident)   . History of pulmonary embolus (PE) 02/14/2017  . HTN (hypertension)   . Hypercholesterolemia   . Hyperglycemia   . Leukopenia 05/06/2018  . Migraines   . Myocardial infarction [facility]) 2008   drug cocaine and marijuana at time of mi none  since    . Normocytic anemia 05/06/2018  . Pneumonia   . Polysubstance abuse (HCC) 01/15/2013  . Rectal bleeding   . Renal insufficiency   . Seizure (HCC) 05/06/2018  . Seizures (HCC)   . Stroke [facility])     Past  Surgical History:  Procedure Laterality Date  . ANTERIOR CERVICAL DECOMP/DISCECTOMY FUSION N/A 01/24/2017   Procedure: Cervical Four-Five,Cervical Five Six,Cervical Six-Seven Anterior cervical discectomy with fusion and plate with Cervical Three-Thoracic- One Laminectomy/Foraminotomy with Cervical Three-Thoracic- One Fixation and fusion;  Surgeon: Russell Halt Ditty, MD;  Location: [facility] OR;  Service: Neurosurgery;  Laterality: N/A;  Part-1 anterior approach  . CARDIAC CATHETERIZATION  2008; ~ 2011   [place]-40%lad  . CARDIAC CATHETERIZATION N/A 03/16/2016   Procedure: Left Heart Cath and Coronary Angiography;  Surgeon: Russell Lex, MD;  Location: [facility] INVASIVE CV LAB;  Service: Cardiovascular;  Laterality: N/A;  . COLONOSCOPY WITH PROPOFOL N/A 04/17/2018    Procedure: COLONOSCOPY WITH PROPOFOL;  Surgeon: Russell Shiver, MD;  Location: [facility] ENDOSCOPY;  Service: Endoscopy;  Laterality: N/A;  . CORONARY ANGIOPLASTY     STENTS  . EYE SURGERY Bilateral    "for double vision"  . INGUINAL HERNIA REPAIR  05/12/2012   Procedure: HERNIA REPAIR INGUINAL ADULT;  Surgeon: Russell Malady, MD;  Location: [facility];  Service: General;  Laterality: Right;  . INGUINAL HERNIA REPAIR Left 02/02/2003   Dr. Violeta Carter. repair with mesh. Same procedure done again in  05/22/2004   . INGUINAL HERNIA REPAIR Left ?date 2nd OR  . LEFT HEART CATH AND CORONARY ANGIOGRAPHY N/A 04/29/2018   Procedure: LEFT HEART CATH AND CORONARY ANGIOGRAPHY;  Surgeon: Carter, Russell M, MD;  Location: [facility] INVASIVE CV LAB;  Service: Cardiovascular;  Laterality: N/A;  . POSTERIOR CERVICAL FUSION/FORAMINOTOMY N/A 01/24/2017   Procedure: Cervical Three-Thoracic One-Laminectomy/Foraminotomy with Cervical Three-Thoracic One Fixation and fusion;  Surgeon: Russell Halt Ditty, MD;  Location: [facility] OR;  Service: Neurosurgery;  Laterality: N/A;  Part-2 Posterior approach  . RECONSTRUCT / STABILIZE DISTAL ULNA    . SHOULDER OPEN ROTATOR CUFF REPAIR Left 2010  . SHOULDER SURGERY Left    "injured on the job; had to cut small bone out"  . THUMB FUSION Right      Current Outpatient Medications  Medication Sig Dispense Refill  . acetaminophen (TYLENOL) 325 MG tablet Take 2 tablets (650 mg total) by mouth every 6 (six) hours as needed for mild pain (or Fever >/= 101). 30 tablet 1  . acetaminophen (TYLENOL) 500 MG tablet Take 1,000 mg by mouth every 6 (six) hours as needed for headache (pain).    Marland Kitchen allopurinol (ZYLOPRIM) 300 MG tablet TAKE 1 TABLET (300 MG TOTAL) BY MOUTH DAILY. 30 tablet 2  . amoxicillin (AMOXIL) 500 MG capsule Take 1 capsule (500 mg total) by mouth 3 (three) times daily. 30 capsule 0  . Calcium Carb-Cholecalciferol (CALCIUM-VITAMIN D) 600-400 MG-UNIT TABS Take 1 tablet by mouth  daily. 30 tablet 5  . clotrimazole (LOTRIMIN) 1 % cream Apply 1 application topically 2 (two) times daily.    . divalproex (DEPAKOTE) 500 MG DR tablet Take 1 tablet (500 mg total) by mouth 2 (two) times daily. 60 tablet 5  . DULoxetine (CYMBALTA) 30 MG capsule Take 1 capsule (30 mg total) by mouth 2 (two) times daily. 60 capsule 5  . folic acid (FOLVITE) 1 MG tablet Take 1 tablet (1 mg total) by mouth daily. 30 tablet 5  . gabapentin (NEURONTIN) 400 MG capsule Take 1 capsule (400 mg total) by mouth 2 (two) times daily. 180 capsule 3  . hydrOXYzine (ATARAX/VISTARIL) 25 MG tablet Take 1 tablet (25 mg total) by mouth every 6 (six) hours as needed. for anxiety 30 tablet 2  . LORazepam (ATIVAN) 0.5 MG  tablet Take 1 tablet (0.5 mg total) by mouth every 8 (eight) hours as needed for anxiety. 12 tablet 0  . losartan (COZAAR) 100 MG tablet Take 1 tablet (100 mg total) by mouth daily. 30 tablet 5  . meclizine (ANTIVERT) 25 MG tablet Take 1 tablet (25 mg total) by mouth daily as needed for dizziness or nausea (vertigo). 15 tablet 0  . methocarbamol (ROBAXIN) 750 MG tablet Take 1 tablet (750 mg total) by mouth every 8 (eight) hours as needed for muscle spasms. 90 tablet 1  . metoCLOPramide (REGLAN) 10 MG tablet TAKE 1 TABLET BY MOUTH EVERY 6 HOURS AS NEEDED FOR NAUSEA OR HEADACHE. 10 tablet 3  . Multiple Vitamin (MULTIVITAMIN WITH MINERALS) TABS tablet Take 1 tablet by mouth daily. 30 tablet 5  . nitroGLYCERIN (NITROSTAT) 0.4 MG SL tablet Place 1 tablet (0.4 mg total) under the tongue every 5 (five) minutes as needed for chest pain. 25 tablet 4  . omeprazole (PRILOSEC) 20 MG capsule Take 20 mg by mouth daily.  3  . ondansetron (ZOFRAN) 4 MG tablet Take 1 tablet (4 mg total) by mouth every 6 (six) hours as needed for nausea. 20 tablet 0  . oxyCODONE-acetaminophen (PERCOCET) 7.5-325 MG tablet Take 1 tablet by mouth every 4 (four) hours as needed for severe pain.    . pantoprazole (PROTONIX) 40 MG tablet Take 1  tablet (40 mg total) by mouth at bedtime. 30 tablet 1  . rosuvastatin (CRESTOR) 40 MG tablet Take 1 tablet (40 mg total) by mouth daily. 30 tablet 5  . traZODone (DESYREL) 100 MG tablet Take 1 tablet (100 mg total) by mouth at bedtime as needed for sleep. 30 tablet 5  . warfarin (COUMADIN) 5 MG tablet Take as directed - 1 tab PO Q M/W/F and 1/2 tab all other days. 30 tablet 3   No current facility-administered medications for this visit.     Allergies:   Shellfish allergy    Social History:  The patient  reports that he has never smoked. He has never used smokeless tobacco. He reports that he drinks alcohol. He reports that he has current or past drug history. Drugs: "Crack" cocaine, Marijuana, and Cocaine.   Family History:  The patient's family history includes Cancer in his brother; Heart attack in his brother, father, and mother.    ROS:  General:no colds or fevers, no weight changes Skin:no rashes or ulcers HEENT:no blurred vision, no congestion CV:see HPI PUL:see HPI GI:no diarrhea constipation or melena, no indigestion GU:no hematuria, no dysuria MS:no joint pain, no claudication Neuro:no syncope, no lightheadedness- + seizure Endo:+ diabetes stable, no thyroid disease  Wt Readings from Last 3 Encounters:  06/26/18 208 lb (94.3 kg)  06/06/18 210 lb 3.2 oz (95.3 kg)  05/20/18 209 lb (94.8 kg)     PHYSICAL EXAM: VS:  BP 122/86   Pulse 75   Ht 5\' 11"  (1.803 m)   Wt 208 lb (94.3 kg)   BMI 29.01 kg/m  , BMI Body mass index is 29.01 kg/m. General:Pleasant affect, NAD Skin:Warm and dry, brisk capillary refill HEENT:normocephalic, sclera clear, mucus membranes moist Neck:supple, no JVD, no bruits  Heart:S1S2 RRR without murmur, gallup, rub or click Lungs:clear without rales, rhonchi, or wheezes ZOX:WRUE, non tender, + BS, do not palpate liver spleen or masses Ext:no lower ext edema, 2+ pedal pulses, 2+ radial pulses Neuro:alert and oriented, MAE, follows commands, +  facial symmetry    EKG:  EKG is ordered today. The ekg ordered  today demonstrates NSR no changes.    Recent Labs: 04/27/2018: TSH 1.057 04/28/2018: Magnesium 2.0 05/07/2018: Hemoglobin 12.0; Platelets 201 06/06/2018: ALT 22 06/18/2018: BUN 13; Creatinine, Ser 1.12; Potassium 4.2; Sodium 141    Lipid Panel    Component Value Date/Time   CHOL 165 06/26/2018 0842   TRIG 129 06/26/2018 0842   HDL 50 06/26/2018 0842   CHOLHDL 3.3 06/26/2018 0842   CHOLHDL 3.3 01/22/2011 0512   VLDL 17 01/22/2011 0512   LDLCALC 89 06/26/2018 0842       Other studies Reviewed: Additional studies/ records that were reviewed today include:. Cardiac cath 04/29/18   Previously placed Mid RCA drug eluting stent is widely patent.  LV end diastolic pressure is mildly elevated.   1. Nonobstructive CAD 2. Mildly elevated LVEDP  Plan: medical management.   Echo 04/28/18 Study Conclusions  - Left ventricle: The cavity size was normal. Wall thickness was   increased in a pattern of mild LVH. There was mild focal basal   hypertrophy of the septum. Systolic function was normal. The   estimated ejection fraction was in the range of 55% to 60%. Wall   motion was normal; there were no regional wall motion   abnormalities. Features are consistent with a pseudonormal left   ventricular filling pattern, with concomitant abnormal relaxation   and increased filling pressure (grade 2 diastolic dysfunction).  Impressions:  - Normal LV systolic function; mild LVH; moderate diastolic   dysfunction.   ASSESSMENT AND PLAN:  1.  Chest pain with hx of CAD but stable cath with patent stents 04/29/18, pain felt to be muscular skeletal.   2.   CAD with hx stent on coumadin --stable cath  3.   HLD on crestor will check lipids today.    4.   Seizure, per PCP to see neuro.   5.  Hx PE.  On coumadin followed by PCP  6.   HTN stable continue meds  7.    DM followed by PCP  Follow up with Dr. Eden Carter in 6  months.     Current medicines are reviewed with the patient today.  The patient Has no concerns regarding medicines.  The following changes have been made:  See above Labs/ tests ordered today include:see above  Disposition:   FU:  see above  Signed, Nada Boozer, NP  06/27/2018 4:27 PM    [facility] [address] [place], [place], [place]  [address]/ [address] [place], [place] Phone: [phone]; Fax: [phone]  [phone]

## 2018-06-25 NOTE — Therapy (Signed)
[place], [place], [address] Phone: [phone]   Fax:  [phone]  Physical Therapy Treatment  Patient Details  Name: Russell Carter MRN: [id number] Date of Birth: 26-Dec-1955 Referring Provider: Simeon Craft, PA   Encounter Date: 06/25/2018  PT End of Session - 06/25/18 0925    Visit Number  2    Number of Visits  13    Date for PT Re-Evaluation  08/18/18    Authorization Type  MCD auth submitted 06/18/18 for 12 visits, Pt was previously seen 13 visits from 01/15/18 - 03/26/18.     PT Start Time  0845    PT Stop Time  0925    PT Time Calculation (min)  40 min    Activity Tolerance  Patient tolerated treatment well    Behavior During Therapy  [facility] for tasks assessed/performed       Past Medical History:  Diagnosis Date  . Acute kidney injury ([place]) 04/27/2018  . AKI (acute kidney injury) ([place])   . Anginal pain ([place])   . Anxiety   . Arthritis   . Bipolar disorder ([place])   . Blood in stool   . CAD (coronary artery disease)    a. 2011: cath showing mod non-obstructive disease b. 02/2016: cath with 95% stenosis in the Mid RCA. DES placed.  . Cervical stenosis of spine   . Chronic anticoagulation 04/27/2018  . Chronic pain disorder 06/05/2017  . CKD (chronic kidney disease), stage III ([place]) 05/06/2018  . Cocaine abuse ([place])    history  . Complete heart block ([place]) 03/14/2016  . Depression   . Diabetes mellitus type 2 in nonobese (HCC)   . Diabetes mellitus without complication ([place])   . Dyslipidemia 03/15/2016  . Essential hypertension 05/18/2009   Qualifier: Diagnosis of  By: Burnett Kanaris    . Gastroesophageal reflux disease without esophagitis 07/31/2017  . Gout, unspecified   . Headache   . History of CVA (cerebrovascular accident)   . History of pulmonary embolus (PE) 02/14/2017  . HTN (hypertension)   . Hypercholesterolemia   . Hyperglycemia   . Leukopenia 05/06/2018  . Migraines   . Myocardial  infarction [facility]) 2008   drug cocaine and marijuana at time of mi none  since    . Normocytic anemia 05/06/2018  . Pneumonia   . Polysubstance abuse ([place]) 01/15/2013  . Rectal bleeding   . Renal insufficiency   . Seizure ([place]) 05/06/2018  . Seizures ([place])   . Stroke [facility])     Past Surgical History:  Procedure Laterality Date  . ANTERIOR CERVICAL DECOMP/DISCECTOMY FUSION N/A 01/24/2017   Procedure: Cervical Four-Five,Cervical Five Six,Cervical Six-Seven Anterior cervical discectomy with fusion and plate with Cervical Three-Thoracic- One Laminectomy/Foraminotomy with Cervical Three-Thoracic- One Fixation and fusion;  Surgeon: Kevan Ny Ditty, MD;  Location: [place];  Service: Neurosurgery;  Laterality: N/A;  Part-1 anterior approach  . CARDIAC CATHETERIZATION  2008; ~ 2011   [place]-40%lad  . CARDIAC CATHETERIZATION N/A 03/16/2016   Procedure: Left Heart Cath and Coronary Angiography;  Surgeon: Leonie Man, MD;  Location: [place] CV LAB;  Service: Cardiovascular;  Laterality: N/A;  . COLONOSCOPY WITH PROPOFOL N/A 04/17/2018   Procedure: COLONOSCOPY WITH PROPOFOL;  Surgeon: Wonda Horner, MD;  Location: [facility] ENDOSCOPY;  Service: Endoscopy;  Laterality: N/A;  . CORONARY ANGIOPLASTY     STENTS  . EYE SURGERY Bilateral    "for double vision"  . INGUINAL HERNIA REPAIR  05/12/2012  Procedure: HERNIA REPAIR INGUINAL ADULT;  Surgeon: Zenovia Jarred, MD;  Location: [place];  Service: General;  Laterality: Right;  . INGUINAL HERNIA REPAIR Left 02/02/2003   Dr. Georganna Skeans. repair with mesh. Same procedure done again in  05/22/2004   . INGUINAL HERNIA REPAIR Left ?date 2nd OR  . LEFT HEART CATH AND CORONARY ANGIOGRAPHY N/A 04/29/2018   Procedure: LEFT HEART CATH AND CORONARY ANGIOGRAPHY;  Surgeon: Martinique, Peter M, MD;  Location: [place] CV LAB;  Service: Cardiovascular;  Laterality: N/A;  . POSTERIOR CERVICAL FUSION/FORAMINOTOMY N/A 01/24/2017   Procedure: Cervical  Three-Thoracic One-Laminectomy/Foraminotomy with Cervical Three-Thoracic One Fixation and fusion;  Surgeon: Kevan Ny Ditty, MD;  Location: [place];  Service: Neurosurgery;  Laterality: N/A;  Part-2 Posterior approach  . RECONSTRUCT / STABILIZE DISTAL ULNA    . SHOULDER OPEN ROTATOR CUFF REPAIR Left 2010  . SHOULDER SURGERY Left    "injured on the job; had to cut small bone out"  . THUMB FUSION Right     There were no vitals filed for this visit.  Subjective Assessment - 06/25/18 0922    Subjective  Pt arriving today complaining of 9-10/10 pain in his left shoulder, cervical spine. Pt reporting pain radiating down his left arm into his fingers with tingling sensation.     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   I can't, I have to constantly change positions    How long can you stand comfortably?  5-10 minutes    How long can you walk comfortably?  10-20 minutes    Diagnostic tests  MRI    Patient Stated Goals  Be able to use my Left Arm for lifting, reaching, cooking, and ADL's    Currently in Pain?  Yes    Pain Score  10-Worst pain ever    Pain Orientation  Left    Pain Descriptors / Indicators  Aching;Sharp;Burning    Pain Type  Chronic pain    Pain Radiating Towards  finger tips     Pain Onset  More than a month ago    Pain Frequency  Constant                       [facility] Adult PT Treatment/Exercise - 06/25/18 0001      Moist Heat Therapy   Number Minutes Moist Heat  12 Minutes    Moist Heat Location  Shoulder   cervical spine     Manual Therapy   Manual Therapy  Soft tissue mobilization;Myofascial release    Manual therapy comments  STW to bilateral upper trap with concentration on left side, levator, medial scapular border, deltoid, and left bicep. skilled plapation of trigger points in levator     Soft tissue mobilization  biofreeze used             PT Education -  06/25/18 0925    Education provided  Yes    Education Details  reviewed HEP verbally, postural correction in sitting and standing    Person(s) Educated  Patient    Methods  Explanation;Demonstration    Comprehension  Verbalized understanding;Returned demonstration       PT Short Term Goals - 06/25/18 0929      PT SHORT TERM GOAL #1   Title  Pt will be independent in his HEP and postural correction.     Baseline  issued new  HEP 06/18/18    Time  3    Period  Weeks    Status  On-going        PT Long Term Goals - 06/25/18 2876      PT LONG TERM GOAL #1   Title  Pt will report pain </= 5/10 with ADL's.     Baseline  currently pain 9-10/10    Time  6    Period  Weeks    Status  New      PT LONG TERM GOAL #2   Title  Pt will improve cervical rotation by >/= 15 degrees to each direction.     Baseline  R rotation: 10 degrees, L rotation 30 degrees    Time  6    Period  Weeks    Status  Not Met            Plan - 06/25/18 0926    Clinical Impression Statement  Pt arriving today reporting 9-10/10 pain in his left shoulder with radiculopathy in to fingertips. Pt reported working in the yard and weedeating over the long weekend. Pt still reporting trouble sleeping. Pt tolerated Manual therapy well with less pain of 6-7/10 reported at end of session. pt with large trigger points noted in left levator and upper trap. Continue with skileld PT to progress toward pt's LTG's set.     Rehab Potential  Fair    PT Frequency  2x / week    PT Duration  4 weeks    PT Treatment/Interventions  ADLs/Self Care Home Management;Cryotherapy;Electrical Stimulation;Ultrasound;Moist Heat;Iontophoresis 24m/ml Dexamethasone;Therapeutic activities;Therapeutic exercise;Patient/family education;Manual techniques;Scar mobilization;Passive range of motion;Taping;Dry needling;Functional mobility training    PT Next Visit Plan  cervical ROM, shoulder ROM, STW, modalities as needed, DN has worked in the past     PT Home Exercise Plan  shoulder flexion, cervical rotation, cervical retraction    Consulted and Agree with Plan of Care  Patient       Patient will benefit from skilled therapeutic intervention in order to improve the following deficits and impairments:  Pain, Improper body mechanics, Postural dysfunction, Increased muscle spasms, Decreased activity tolerance, Decreased range of motion, Decreased strength, Impaired UE functional use  Visit Diagnosis: 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 CKD (chronic kidney disease), stage III ([place] 05/06/2018  . Seizure ([place] 05/06/2018  . Normocytic anemia 05/06/2018  . Leukopenia 05/06/2018  . AKI (acute kidney injury) ([place]   . Acute kidney injury ([place] 04/27/2018  . Abdominal pain 04/27/2018  . Chronic anticoagulation 04/27/2018  . Left knee pain 01/10/2018  . Numbness and tingling in both hands 07/31/2017  . Gastroesophageal reflux disease without esophagitis 07/31/2017  . Chronic pain disorder 06/05/2017  . History of pulmonary embolus (PE) 02/14/2017  . Diabetes mellitus type 2 in nonobese (HCC)   . Cocaine abuse ([place]   . Depression   . History of CVA (cerebrovascular accident)   . Cervical spondylosis with radiculopathy 01/24/2017  . Dyslipidemia 03/15/2016  . Complete heart block ([place] 03/14/2016  . Cervical spinal stenosis 03/14/2016  . CAD-50% LAD 2011 12/14/2014  . Gout 11/16/2014  . Polysubstance abuse ([place] 01/15/2013  . DIZZINESS 06/05/2010  . Essential hypertension 05/18/2009  Oretha Caprice, MPT 06/25/2018, 9:31 AM  [facility] [address] [name], [place],  [address] Phone: [phone]   Fax:  [phone]  Name: Russell Carter MRN: [id number] Date of Birth: 10-28-1955

## 2018-06-26 ENCOUNTER — Encounter: Payer: Self-pay | Admitting: Cardiology

## 2018-06-26 ENCOUNTER — Ambulatory Visit: Payer: Medicaid Other | Admitting: Cardiology

## 2018-06-26 VITALS — BP 122/86 | HR 75 | Ht 71.0 in | Wt 208.0 lb

## 2018-06-26 DIAGNOSIS — E7800 Pure hypercholesterolemia, unspecified: Secondary | ICD-10-CM

## 2018-06-26 DIAGNOSIS — R0789 Other chest pain: Secondary | ICD-10-CM | POA: Diagnosis not present

## 2018-06-26 DIAGNOSIS — I2510 Atherosclerotic heart disease of native coronary artery without angina pectoris: Secondary | ICD-10-CM

## 2018-06-26 DIAGNOSIS — I2782 Chronic pulmonary embolism: Secondary | ICD-10-CM

## 2018-06-26 DIAGNOSIS — I10 Essential (primary) hypertension: Secondary | ICD-10-CM

## 2018-06-26 DIAGNOSIS — E119 Type 2 diabetes mellitus without complications: Secondary | ICD-10-CM

## 2018-06-26 DIAGNOSIS — R569 Unspecified convulsions: Secondary | ICD-10-CM

## 2018-06-26 LAB — LIPID PANEL
CHOLESTEROL TOTAL: 165 mg/dL (ref 100–199)
Chol/HDL Ratio: 3.3 ratio (ref 0.0–5.0)
HDL: 50 mg/dL (ref >39)
LDL Calculated: 89 mg/dL (ref 0–99)
Triglycerides: 129 mg/dL (ref 0–149)
VLDL CHOLESTEROL CAL: 26 mg/dL (ref 5–40)

## 2018-06-26 NOTE — Patient Instructions (Signed)
Medication Instructions:  1. Your physician recommends that you continue on your current medications as directed. Please refer to the Current Medication list given to you today.  Labwork: TODAY LIPID PANEL  Testing/Procedures: NONE ORDERED TODAY  Follow-Up: 6 MONTHS WITH DR. Eden Emms   Any Other Special Instructions Will Be Listed Below (If Applicable).     If you need a refill on your cardiac medications before your next appointment, please call your pharmacy.

## 2018-06-27 ENCOUNTER — Encounter: Payer: Self-pay | Admitting: Cardiology

## 2018-07-01 ENCOUNTER — Ambulatory Visit: Payer: Medicaid Other | Admitting: Physical Therapy

## 2018-07-02 ENCOUNTER — Encounter: Payer: Self-pay | Admitting: Physical Therapy

## 2018-07-02 ENCOUNTER — Ambulatory Visit: Payer: Medicaid Other | Attending: Internal Medicine | Admitting: Pharmacist

## 2018-07-02 ENCOUNTER — Ambulatory Visit: Payer: Medicaid Other | Admitting: Physical Therapy

## 2018-07-02 ENCOUNTER — Telehealth: Payer: Self-pay | Admitting: Pharmacist

## 2018-07-02 DIAGNOSIS — R29818 Other symptoms and signs involving the nervous system: Secondary | ICD-10-CM

## 2018-07-02 DIAGNOSIS — M79602 Pain in left arm: Secondary | ICD-10-CM

## 2018-07-02 DIAGNOSIS — Z86711 Personal history of pulmonary embolism: Secondary | ICD-10-CM | POA: Diagnosis not present

## 2018-07-02 DIAGNOSIS — M4722 Other spondylosis with radiculopathy, cervical region: Secondary | ICD-10-CM

## 2018-07-02 DIAGNOSIS — M79601 Pain in right arm: Secondary | ICD-10-CM

## 2018-07-02 DIAGNOSIS — M6281 Muscle weakness (generalized): Secondary | ICD-10-CM

## 2018-07-02 DIAGNOSIS — G540 Brachial plexus disorders: Secondary | ICD-10-CM | POA: Diagnosis not present

## 2018-07-02 DIAGNOSIS — R278 Other lack of coordination: Secondary | ICD-10-CM

## 2018-07-02 DIAGNOSIS — M542 Cervicalgia: Secondary | ICD-10-CM

## 2018-07-02 LAB — POCT INR: INR: 3.1 — AB (ref 2.0–3.0)

## 2018-07-02 NOTE — Telephone Encounter (Signed)
Received call from Mr. Raffety stating his dentist would like to proceed with tooth extraction Thursday (07/10/18). They require INR <2. Will forward this information to patient's PCP. Patient stated dental office is to fax [place] with information.

## 2018-07-02 NOTE — Therapy (Signed)
[place], [place], [address] Phone: [phone]   Fax:  [phone]  Physical Therapy Treatment  Patient Details  Name: Russell Carter MRN: [id number] Date of Birth: 07/19/1956 Referring Provider: Simeon Craft, PA   Encounter Date: 07/02/2018  PT End of Session - 07/02/18 0842    Visit Number  3    Number of Visits  13    Date for PT Re-Evaluation  08/18/18    Authorization Type  MCD auth submitted 06/18/18 for 12 visits, Pt was previously seen 13 visits from 01/15/18 - 03/26/18.     Authorization - Visit Number  4    Authorization - Number of Visits  12    PT Start Time  0845    PT Stop Time  0935    PT Time Calculation (min)  50 min    Activity Tolerance  Patient tolerated treatment well    Behavior During Therapy  WFL for tasks assessed/performed       Past Medical History:  Diagnosis Date  . Acute kidney injury ([place]) 04/27/2018  . AKI (acute kidney injury) ([place])   . Anginal pain ([place])   . Anxiety   . Arthritis   . Bipolar disorder ([place])   . Blood in stool   . CAD (coronary artery disease)    a. 2011: cath showing mod non-obstructive disease b. 02/2016: cath with 95% stenosis in the Mid RCA. DES placed.  . Cervical stenosis of spine   . Chronic anticoagulation 04/27/2018  . Chronic pain disorder 06/05/2017  . CKD (chronic kidney disease), stage III ([place]) 05/06/2018  . Cocaine abuse ([place])    history  . Complete heart block ([place]) 03/14/2016  . Depression   . Diabetes mellitus type 2 in nonobese (HCC)   . Diabetes mellitus without complication ([place])   . Dyslipidemia 03/15/2016  . Essential hypertension 05/18/2009   Qualifier: Diagnosis of  By: Burnett Kanaris    . Gastroesophageal reflux disease without esophagitis 07/31/2017  . Gout, unspecified   . Headache   . History of CVA (cerebrovascular accident)   . History of pulmonary embolus (PE) 02/14/2017  . HTN (hypertension)   . Hypercholesterolemia    . Hyperglycemia   . Leukopenia 05/06/2018  . Migraines   . Myocardial infarction [facility]) 2008   drug cocaine and marijuana at time of mi none  since    . Normocytic anemia 05/06/2018  . Pneumonia   . Polysubstance abuse ([place]) 01/15/2013  . Rectal bleeding   . Renal insufficiency   . Seizure ([place]) 05/06/2018  . Seizures ([place])   . Stroke [facility])     Past Surgical History:  Procedure Laterality Date  . ANTERIOR CERVICAL DECOMP/DISCECTOMY FUSION N/A 01/24/2017   Procedure: Cervical Four-Five,Cervical Five Six,Cervical Six-Seven Anterior cervical discectomy with fusion and plate with Cervical Three-Thoracic- One Laminectomy/Foraminotomy with Cervical Three-Thoracic- One Fixation and fusion;  Surgeon: Kevan Ny Ditty, MD;  Location: [facility];  Service: Neurosurgery;  Laterality: N/A;  Part-1 anterior approach  . CARDIAC CATHETERIZATION  2008; ~ 2011   [place]-40%lad  . CARDIAC CATHETERIZATION N/A 03/16/2016   Procedure: Left Heart Cath and Coronary Angiography;  Surgeon: Leonie Man, MD;  Location: [facility];  Service: Cardiovascular;  Laterality: N/A;  . COLONOSCOPY WITH PROPOFOL N/A 04/17/2018   Procedure: COLONOSCOPY WITH PROPOFOL;  Surgeon: Wonda Horner, MD;  Location: [facility]OS[facility];  Service: Endoscopy;  Laterality: N/A;  . CORONARY ANGIOPLASTY     STENTS  .  EYE SURGERY Bilateral    "for double vision"  . INGUINAL HERNIA REPAIR  05/12/2012   Procedure: HERNIA REPAIR INGUINAL ADULT;  Surgeon: Zenovia Jarred, MD;  Location: [facility];  Service: General;  Laterality: Right;  . INGUINAL HERNIA REPAIR Left 02/02/2003   Dr. Georganna Skeans. repair with mesh. Same procedure done again in  05/22/2004   . INGUINAL HERNIA REPAIR Left ?date 2nd OR  . LEFT HEART CATH AND CORONARY ANGIOGRAPHY N/A 04/29/2018   Procedure: LEFT HEART CATH AND CORONARY ANGIOGRAPHY;  Surgeon: Martinique, Peter M, MD;  Location: [facility] CV LAB;  Service: Cardiovascular;  Laterality: N/A;  .  POSTERIOR CERVICAL FUSION/FORAMINOTOMY N/A 01/24/2017   Procedure: Cervical Three-Thoracic One-Laminectomy/Foraminotomy with Cervical Three-Thoracic One Fixation and fusion;  Surgeon: Kevan Ny Ditty, MD;  Location: [facility];  Service: Neurosurgery;  Laterality: N/A;  Part-2 Posterior approach  . RECONSTRUCT / STABILIZE DISTAL ULNA    . SHOULDER OPEN ROTATOR CUFF REPAIR Left 2010  . SHOULDER SURGERY Left    "injured on the job; had to cut small bone out"  . THUMB FUSION Right     There were no vitals filed for this visit.  Subjective Assessment - 07/02/18 0841    Subjective  Pt arriving today complaining of 9/10 pain in his left shoulder, cervical spine. Pt reporting pain radiating down his left arm into his fingers with tingling sensation.     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   I can't, I have to constantly change positions    How long can you stand comfortably?  5-10 minutes    How long can you walk comfortably?  10-20 minutes    Diagnostic tests  MRI    Patient Stated Goals  Be able to use my Left Arm for lifting, reaching, cooking, and ADL's    Currently in Pain?  Yes    Pain Score  9     Pain Orientation  Left    Pain Descriptors / Indicators  Sharp;Burning    Pain Onset  More than a month ago    Pain Frequency  Constant                       [facility] Adult PT Treatment/Exercise - 07/02/18 0001      Exercises   Exercises  Shoulder      Shoulder Exercises: Seated   Other Seated Exercises  Nustep 5 minutes L4       Shoulder Exercises: Standing   Retraction  5 reps    Other Standing Exercises  wall posture correction      Shoulder Exercises: Pulleys   Flexion  3 minutes      Shoulder Exercises: Stretch   Wall Stretch - Flexion  2 reps;10 seconds      Moist Heat Therapy   Number Minutes Moist Heat  10 Minutes    Moist Heat Location  Shoulder      Manual Therapy   Manual  Therapy  Soft tissue mobilization;Myofascial release    Manual therapy comments  STW to bilateral upper trap with concentration on left side, levator, medial scapular border, deltoid, and left bicep. skilled plapation of trigger points in levator     Soft tissue mobilization  biofreeze used               PT Short Term Goals - 06/25/18 8466  PT SHORT TERM GOAL #1   Title  Pt will be independent in his HEP and postural correction.     Baseline  issued new HEP 06/18/18    Time  3    Period  Weeks    Status  On-going        PT Long Term Goals - 07/02/18 0845      PT LONG TERM GOAL #1   Title  Pt will report pain </= 5/10 with ADL's.     Baseline  currently pain 9-10/10    Period  Weeks    Status  New      PT LONG TERM GOAL #2   Title  Pt will improve cervical rotation by >/= 15 degrees to each direction.     Baseline  R rotation: 10 degrees, L rotation 30 degrees    Time  6    Period  Weeks    Status  Not Met      PT LONG TERM GOAL #3   Title  Pt will be able to use Left arm to cook for himself for return to independence    Baseline  still lacking enough strength to not drop the pots and pans     Time  14    Period  Weeks    Status  On-going      PT LONG TERM GOAL #4   Title  Pt will be able to return to light sporting activities such as beginning karate and basketball to begin return to PLOF    Baseline  No back to sports yet     Time  14    Period  Weeks    Status  On-going      PT LONG TERM GOAL #5   Title  Pt will be able to shave independently    Baseline  independlty shaving     Time  14    Period  Weeks    Status  Achieved            Plan - 07/02/18 0843    Clinical Impression Statement  Pt arriving to therapy reporting 9/10 pain in his left shoulder with radiculopathy down left arm. Pt tolerating exercises well as well as STW to his L upper trap and levator. Pt reporting 6/10 pain following manual therapy. Continue skilled PT to progress  toward his goals set.     Rehab Potential  Fair    PT Duration  4 weeks    PT Treatment/Interventions  ADLs/Self Care Home Management;Cryotherapy;Electrical Stimulation;Ultrasound;Moist Heat;Iontophoresis '4mg'$ /ml Dexamethasone;Therapeutic activities;Therapeutic exercise;Patient/family education;Manual techniques;Scar mobilization;Passive range of motion;Taping;Dry needling;Functional mobility training    PT Next Visit Plan  cervical ROM, shoulder ROM, STW, modalities as needed, DN has worked in the past    PT Home Exercise Plan  shoulder flexion, cervical rotation, cervical retraction    Consulted and Agree with Plan of Care  Patient       Patient will benefit from skilled therapeutic intervention in order to improve the following deficits and impairments:     Visit Diagnosis: 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 CKD (chronic kidney disease), stage III ([place]) 05/06/2018  . Seizure ([place]) 05/06/2018  .  Normocytic anemia 05/06/2018  . Leukopenia 05/06/2018  . AKI (acute kidney injury) ([place])   . Acute kidney injury ([place]) 04/27/2018  . Abdominal pain 04/27/2018  . Chronic anticoagulation 04/27/2018  . Left knee pain 01/10/2018  . Numbness and tingling in both hands 07/31/2017  . Gastroesophageal reflux disease without esophagitis 07/31/2017  . Chronic pain disorder 06/05/2017  . History of pulmonary embolus (PE) 02/14/2017  . Diabetes mellitus type 2 in nonobese (HCC)   . Cocaine abuse ([place])   . Depression   . History of CVA (cerebrovascular accident)   . Cervical spondylosis with radiculopathy 01/24/2017  . Dyslipidemia 03/15/2016  . Complete heart block ([place]) 03/14/2016  . Cervical  spinal stenosis 03/14/2016  . CAD-50% LAD 2011 12/14/2014  . Gout 11/16/2014  . Polysubstance abuse ([place]) 01/15/2013  . DIZZINESS 06/05/2010  . Essential hypertension 05/18/2009    Oretha Caprice , MPT 07/02/2018, 9:26 AM  [facility] [address] [place], [place], [address] Phone: [phone]   Fax:  [phone]  Name: Dontrail Blackwell MRN: [id number] Date of Birth: 02/09/1956

## 2018-07-07 ENCOUNTER — Encounter: Payer: Self-pay | Admitting: Physical Therapy

## 2018-07-07 ENCOUNTER — Ambulatory Visit: Payer: Medicaid Other | Admitting: Physical Therapy

## 2018-07-07 DIAGNOSIS — M6281 Muscle weakness (generalized): Secondary | ICD-10-CM

## 2018-07-07 DIAGNOSIS — G540 Brachial plexus disorders: Secondary | ICD-10-CM

## 2018-07-07 DIAGNOSIS — M542 Cervicalgia: Secondary | ICD-10-CM

## 2018-07-07 DIAGNOSIS — M79602 Pain in left arm: Secondary | ICD-10-CM

## 2018-07-07 NOTE — Therapy (Signed)
[place], [place], [address] Phone: [phone]   Fax:  [phone]  Physical Therapy Treatment  Patient Details  Name: Russell Carter MRN: [id number] Date of Birth: Jan 03, 1956 Referring Provider: Simeon Craft, PA   Encounter Date: 07/07/2018  PT End of Session - 07/07/18 1104    Visit Number  4    Date for PT Re-Evaluation  08/18/18    Authorization Type  MCD auth 8 visits 9/3-9/30    Authorization - Visit Number  3    Authorization - Number of Visits  8    PT Start Time  1102    PT Stop Time  1141    PT Time Calculation (min)  39 min    Activity Tolerance  Patient tolerated treatment well    Behavior During Therapy  [facility] for tasks assessed/performed       Past Medical History:  Diagnosis Date  . Acute kidney injury ([place]) 04/27/2018  . AKI (acute kidney injury) ([place])   . Anginal pain ([place])   . Anxiety   . Arthritis   . Bipolar disorder ([place])   . Blood in stool   . CAD (coronary artery disease)    a. 2011: cath showing mod non-obstructive disease b. 02/2016: cath with 95% stenosis in the Mid RCA. DES placed.  . Cervical stenosis of spine   . Chronic anticoagulation 04/27/2018  . Chronic pain disorder 06/05/2017  . CKD (chronic kidney disease), stage III ([place]) 05/06/2018  . Cocaine abuse ([place])    history  . Complete heart block ([place]) 03/14/2016  . Depression   . Diabetes mellitus type 2 in nonobese (HCC)   . Diabetes mellitus without complication ([place])   . Dyslipidemia 03/15/2016  . Essential hypertension 05/18/2009   Qualifier: Diagnosis of  By: Burnett Kanaris    . Gastroesophageal reflux disease without esophagitis 07/31/2017  . Gout, unspecified   . Headache   . History of CVA (cerebrovascular accident)   . History of pulmonary embolus (PE) 02/14/2017  . HTN (hypertension)   . Hypercholesterolemia   . Hyperglycemia   . Leukopenia 05/06/2018  . Migraines   . Myocardial infarction [facility]) 2008    drug cocaine and marijuana at time of mi none  since    . Normocytic anemia 05/06/2018  . Pneumonia   . Polysubstance abuse ([place]) 01/15/2013  . Rectal bleeding   . Renal insufficiency   . Seizure ([place]) 05/06/2018  . Seizures ([place])   . Stroke [facility])     Past Surgical History:  Procedure Laterality Date  . ANTERIOR CERVICAL DECOMP/DISCECTOMY FUSION N/A 01/24/2017   Procedure: Cervical Four-Five,Cervical Five Six,Cervical Six-Seven Anterior cervical discectomy with fusion and plate with Cervical Three-Thoracic- One Laminectomy/Foraminotomy with Cervical Three-Thoracic- One Fixation and fusion;  Surgeon: Kevan Ny Ditty, MD;  Location: [facility];  Service: Neurosurgery;  Laterality: N/A;  Part-1 anterior approach  . CARDIAC CATHETERIZATION  2008; ~ 2011   [place]-40%lad  . CARDIAC CATHETERIZATION N/A 03/16/2016   Procedure: Left Heart Cath and Coronary Angiography;  Surgeon: Leonie Man, MD;  Location: [facility] CV LAB;  Service: Cardiovascular;  Laterality: N/A;  . COLONOSCOPY WITH PROPOFOL N/A 04/17/2018   Procedure: COLONOSCOPY WITH PROPOFOL;  Surgeon: Wonda Horner, MD;  Location: [facility] ENDOSCOPY;  Service: Endoscopy;  Laterality: N/A;  . CORONARY ANGIOPLASTY     STENTS  . EYE SURGERY Bilateral    "for double vision"  . INGUINAL HERNIA REPAIR  05/12/2012   Procedure: HERNIA  REPAIR INGUINAL ADULT;  Surgeon: Zenovia Jarred, MD;  Location: [facility];  Service: General;  Laterality: Right;  . INGUINAL HERNIA REPAIR Left 02/02/2003   Dr. Georganna Skeans. repair with mesh. Same procedure done again in  05/22/2004   . INGUINAL HERNIA REPAIR Left ?date 2nd OR  . LEFT HEART CATH AND CORONARY ANGIOGRAPHY N/A 04/29/2018   Procedure: LEFT HEART CATH AND CORONARY ANGIOGRAPHY;  Surgeon: Martinique, Peter M, MD;  Location: [place] CV LAB;  Service: Cardiovascular;  Laterality: N/A;  . POSTERIOR CERVICAL FUSION/FORAMINOTOMY N/A 01/24/2017   Procedure: Cervical Three-Thoracic  One-Laminectomy/Foraminotomy with Cervical Three-Thoracic One Fixation and fusion;  Surgeon: Kevan Ny Ditty, MD;  Location: [facility];  Service: Neurosurgery;  Laterality: N/A;  Part-2 Posterior approach  . RECONSTRUCT / STABILIZE DISTAL ULNA    . SHOULDER OPEN ROTATOR CUFF REPAIR Left 2010  . SHOULDER SURGERY Left    "injured on the job; had to cut small bone out"  . THUMB FUSION Right     There were no vitals filed for this visit.  Subjective Assessment - 07/07/18 1104    Subjective  It's hurting. Severe pain in post neck and going down to Lt shoulder- just past elbow. Denies HA or blurred vision. Exercises are helping- I am not in as much pain when I do them. the pain kicks in when I am not doing anything. I use my arm more now    Patient Stated Goals  Be able to use my Left Arm for lifting, reaching, cooking, and ADL's    Currently in Pain?  Yes    Pain Score  9     Pain Location  Neck    Pain Orientation  Left    Pain Radiating Towards  Lt shoulder just past elbow                       [facility] Adult PT Treatment/Exercise - 07/07/18 0001      Exercises   Exercises  Shoulder      Shoulder Exercises: Supine   Flexion Limitations  chest press to 90, holding towel with both hands'      Shoulder Exercises: Seated   Other Seated Exercises  nu step UE & LE L4      Shoulder Exercises: Sidelying   External Rotation  15 reps;Left    Flexion  15 reps    ABduction  15 reps    ABduction Limitations  elbow flexed to 90      Shoulder Exercises: Standing   Flexion  20 reps    Flexion Limitations  with wand to 90, back to wall    Extension  15 reps    Extension Limitations  back to wall    Other Standing Exercises  scap retraction at wall      Shoulder Exercises: Pulleys   Flexion  2 minutes      Manual Therapy   Manual therapy comments  edu on use of tennis ball at wall for self STW    Soft tissue mobilization  Lt upper trap, levator               PT  Short Term Goals - 06/25/18 0929      PT SHORT TERM GOAL #1   Title  Pt will be independent in his HEP and postural correction.     Baseline  issued new HEP 06/18/18    Time  3    Period  Weeks  Status  On-going        PT Long Term Goals - 07/02/18 0845      PT LONG TERM GOAL #1   Title  Pt will report pain </= 5/10 with ADL's.     Baseline  currently pain 9-10/10    Period  Weeks    Status  New      PT LONG TERM GOAL #2   Title  Pt will improve cervical rotation by >/= 15 degrees to each direction.     Baseline  R rotation: 10 degrees, L rotation 30 degrees    Time  6    Period  Weeks    Status  Not Met      PT LONG TERM GOAL #3   Title  Pt will be able to use Left arm to cook for himself for return to independence    Baseline  still lacking enough strength to not drop the pots and pans     Time  14    Period  Weeks    Status  On-going      PT LONG TERM GOAL #4   Title  Pt will be able to return to light sporting activities such as beginning karate and basketball to begin return to PLOF    Baseline  No back to sports yet     Time  14    Period  Weeks    Status  On-going      PT LONG TERM GOAL #5   Title  Pt will be able to shave independently    Baseline  independlty shaving     Time  14    Period  Weeks    Status  Achieved            Plan - 07/07/18 1216    Clinical Impression Statement  Pt reported fatigue with exercises without an increase in pain. Frequent cues to reduce upper trap use which is able without tactile cuing for most exercises. Added some strengthening to HEP to focus on deltoid and periscapular activation.     PT Treatment/Interventions  ADLs/Self Care Home Management;Cryotherapy;Electrical Stimulation;Ultrasound;Moist Heat;Iontophoresis '4mg'$ /ml Dexamethasone;Therapeutic activities;Therapeutic exercise;Patient/family education;Manual techniques;Scar mobilization;Passive range of motion;Taping;Dry needling;Functional mobility training    PT  Next Visit Plan  progress strengthening as tolerated- deltoid, reducing upper trap use    PT Home Exercise Plan  shoulder flexion, cervical rotation, cervical retraction, supine chest press to 90, sidelying ER    Consulted and Agree with Plan of Care  Patient       Patient will benefit from skilled therapeutic intervention in order to improve the following deficits and impairments:  Pain, Improper body mechanics, Postural dysfunction, Increased muscle spasms, Decreased activity tolerance, Decreased range of motion, Decreased strength, Impaired UE functional use  Visit Diagnosis: 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 CKD (chronic kidney disease), stage III ([place]) 05/06/2018  . Seizure ([place]) 05/06/2018  . Normocytic anemia 05/06/2018  . Leukopenia 05/06/2018  . AKI (acute kidney injury) ([place])   . Acute kidney injury ([place]) 04/27/2018  . Abdominal pain 04/27/2018  . Chronic anticoagulation 04/27/2018  . Left knee pain 01/10/2018  . Numbness and tingling in both hands 07/31/2017  . Gastroesophageal reflux disease without esophagitis 07/31/2017  . Chronic pain disorder 06/05/2017  . History of pulmonary embolus (PE) 02/14/2017  .  Diabetes mellitus type 2 in nonobese (HCC)   . Cocaine abuse ([place])   . Depression   . History of CVA (cerebrovascular accident)   . Cervical spondylosis with radiculopathy 01/24/2017  . Dyslipidemia 03/15/2016  . Complete heart block ([place]) 03/14/2016  . Cervical spinal stenosis 03/14/2016  . CAD-50% LAD 2011 12/14/2014  . Gout 11/16/2014  . Polysubstance abuse ([place]) 01/15/2013  . DIZZINESS 06/05/2010  . Essential hypertension 05/18/2009    Ronette Hank C. Laniesha Das PT, DPT 07/07/18 12:21 PM   [place], [place], [address] Phone:  [phone]   Fax:  [phone]  Name: Russell Carter MRN: [id number] Date of Birth: September 08, 1956

## 2018-07-08 ENCOUNTER — Ambulatory Visit: Payer: Medicaid Other | Attending: Family Medicine | Admitting: Pharmacist

## 2018-07-08 DIAGNOSIS — Z86711 Personal history of pulmonary embolism: Secondary | ICD-10-CM | POA: Diagnosis not present

## 2018-07-08 LAB — POCT INR: INR: 2.3 (ref 2.0–3.0)

## 2018-07-09 ENCOUNTER — Encounter: Payer: Self-pay | Admitting: Pharmacist

## 2018-07-09 ENCOUNTER — Ambulatory Visit: Payer: Medicaid Other | Admitting: Physical Therapy

## 2018-07-09 ENCOUNTER — Encounter: Payer: Self-pay | Admitting: Physical Therapy

## 2018-07-09 DIAGNOSIS — G540 Brachial plexus disorders: Secondary | ICD-10-CM

## 2018-07-09 DIAGNOSIS — M542 Cervicalgia: Secondary | ICD-10-CM

## 2018-07-09 DIAGNOSIS — M6281 Muscle weakness (generalized): Secondary | ICD-10-CM

## 2018-07-09 DIAGNOSIS — M79602 Pain in left arm: Secondary | ICD-10-CM

## 2018-07-09 NOTE — Therapy (Addendum)
[place], [place], [address] Phone: [phone]   Fax:  [phone]  Physical Therapy Treatment/Discharge  Patient Details  Name: Russell Carter MRN: [id number] Date of Birth: 1956/03/30 Referring Provider: Simeon Craft, PA   Encounter Date: 07/09/2018  PT End of Session - 07/09/18 1017    Visit Number  5    Date for PT Re-Evaluation  08/18/18    Authorization Type  MCD auth 8 visits 9/3-9/30    Authorization - Visit Number  4    Authorization - Number of Visits  8    PT Start Time  2725    PT Stop Time  1054    PT Time Calculation (min)  39 min    Activity Tolerance  Patient tolerated treatment well    Behavior During Therapy  [facility] for tasks assessed/performed       Past Medical History:  Diagnosis Date  . Acute kidney injury ([place]) 04/27/2018  . AKI (acute kidney injury) ([place])   . Anginal pain ([place])   . Anxiety   . Arthritis   . Bipolar disorder ([place])   . Blood in stool   . CAD (coronary artery disease)    a. 2011: cath showing mod non-obstructive disease b. 02/2016: cath with 95% stenosis in the Mid RCA. DES placed.  . Cervical stenosis of spine   . Chronic anticoagulation 04/27/2018  . Chronic pain disorder 06/05/2017  . CKD (chronic kidney disease), stage III ([place]) 05/06/2018  . Cocaine abuse ([place])    history  . Complete heart block ([place]) 03/14/2016  . Depression   . Diabetes mellitus type 2 in nonobese (HCC)   . Diabetes mellitus without complication ([place])   . Dyslipidemia 03/15/2016  . Essential hypertension 05/18/2009   Qualifier: Diagnosis of  By: Burnett Kanaris    . Gastroesophageal reflux disease without esophagitis 07/31/2017  . Gout, unspecified   . Headache   . History of CVA (cerebrovascular accident)   . History of pulmonary embolus (PE) 02/14/2017  . HTN (hypertension)   . Hypercholesterolemia   . Hyperglycemia   . Leukopenia 05/06/2018  . Migraines   . Myocardial infarction  [facility]) 2008   drug cocaine and marijuana at time of mi none  since    . Normocytic anemia 05/06/2018  . Pneumonia   . Polysubstance abuse ([place]) 01/15/2013  . Rectal bleeding   . Renal insufficiency   . Seizure ([place]) 05/06/2018  . Seizures ([place])   . Stroke [facility])     Past Surgical History:  Procedure Laterality Date  . ANTERIOR CERVICAL DECOMP/DISCECTOMY FUSION N/A 01/24/2017   Procedure: Cervical Four-Five,Cervical Five Six,Cervical Six-Seven Anterior cervical discectomy with fusion and plate with Cervical Three-Thoracic- One Laminectomy/Foraminotomy with Cervical Three-Thoracic- One Fixation and fusion;  Surgeon: Kevan Ny Ditty, MD;  Location: [place];  Service: Neurosurgery;  Laterality: N/A;  Part-1 anterior approach  . CARDIAC CATHETERIZATION  2008; ~ 2011   [place]-40%lad  . CARDIAC CATHETERIZATION N/A 03/16/2016   Procedure: Left Heart Cath and Coronary Angiography;  Surgeon: Leonie Man, MD;  Location: [place] CV LAB;  Service: Cardiovascular;  Laterality: N/A;  . COLONOSCOPY WITH PROPOFOL N/A 04/17/2018   Procedure: COLONOSCOPY WITH PROPOFOL;  Surgeon: Wonda Horner, MD;  Location: [facility] ENDOSCOPY;  Service: Endoscopy;  Laterality: N/A;  . CORONARY ANGIOPLASTY     STENTS  . EYE SURGERY Bilateral    "for double vision"  . INGUINAL HERNIA REPAIR  05/12/2012   Procedure: HERNIA  REPAIR INGUINAL ADULT;  Surgeon: Zenovia Jarred, MD;  Location: [place];  Service: General;  Laterality: Right;  . INGUINAL HERNIA REPAIR Left 02/02/2003   Dr. Georganna Skeans. repair with mesh. Same procedure done again in  05/22/2004   . INGUINAL HERNIA REPAIR Left ?date 2nd OR  . LEFT HEART CATH AND CORONARY ANGIOGRAPHY N/A 04/29/2018   Procedure: LEFT HEART CATH AND CORONARY ANGIOGRAPHY;  Surgeon: Martinique, Peter M, MD;  Location: [place] CV LAB;  Service: Cardiovascular;  Laterality: N/A;  . POSTERIOR CERVICAL FUSION/FORAMINOTOMY N/A 01/24/2017   Procedure: Cervical Three-Thoracic  One-Laminectomy/Foraminotomy with Cervical Three-Thoracic One Fixation and fusion;  Surgeon: Kevan Ny Ditty, MD;  Location: [place];  Service: Neurosurgery;  Laterality: N/A;  Part-2 Posterior approach  . RECONSTRUCT / STABILIZE DISTAL ULNA    . SHOULDER OPEN ROTATOR CUFF REPAIR Left 2010  . SHOULDER SURGERY Left    "injured on the job; had to cut small bone out"  . THUMB FUSION Right     There were no vitals filed for this visit.  Subjective Assessment - 07/09/18 1017    Subjective  Shoulder is feeling stiff today.     Patient Stated Goals  Be able to use my Left Arm for lifting, reaching, cooking, and ADL's    Currently in Pain?  Yes    Pain Score  8     Pain Location  Neck    Pain Descriptors / Indicators  --   stiff                      [facility] Adult PT Treatment/Exercise - 07/09/18 0001      Exercises   Exercises  Shoulder      Shoulder Exercises: Supine   Flexion Limitations  chest press to 90 with dowel+serratus press    Other Supine Exercises  arms 90/90 holding wand in supination isometric press    Other Supine Exercises  flx to 90, small circles      Shoulder Exercises: Seated   Extension Limitations  triceps extension yellow tband    Retraction  15 reps    External Rotation  15 reps;Theraband    Theraband Level (Shoulder External Rotation)  Level 1 (Yellow)    Other Seated Exercises  biceps curl 2#      Shoulder Exercises: Sidelying   External Rotation  Left;15 reps    ABduction Limitations  elbow flexed to 90      Shoulder Exercises: Standing   Other Standing Exercises  hands on table weight shift      Shoulder Exercises: Pulleys   Flexion  3 minutes      Manual Therapy   Soft tissue mobilization  IASTM Lt upper trap, Lt cervcal paraspinals, levator               PT Short Term Goals - 06/25/18 0929      PT SHORT TERM GOAL #1   Title  Pt will be independent in his HEP and postural correction.     Baseline  issued new HEP  06/18/18    Time  3    Period  Weeks    Status  On-going        PT Long Term Goals - 07/02/18 0845      PT LONG TERM GOAL #1   Title  Pt will report pain </= 5/10 with ADL's.     Baseline  currently pain 9-10/10    Period  Weeks  Status  New      PT LONG TERM GOAL #2   Title  Pt will improve cervical rotation by >/= 15 degrees to each direction.     Baseline  R rotation: 10 degrees, L rotation 30 degrees    Time  6    Period  Weeks    Status  Not Met      PT LONG TERM GOAL #3   Title  Pt will be able to use Left arm to cook for himself for return to independence    Baseline  still lacking enough strength to not drop the pots and pans     Time  14    Period  Weeks    Status  On-going      PT LONG TERM GOAL #4   Title  Pt will be able to return to light sporting activities such as beginning karate and basketball to begin return to PLOF    Baseline  No back to sports yet     Time  14    Period  Weeks    Status  On-going      PT LONG TERM GOAL #5   Title  Pt will be able to shave independently    Baseline  independlty shaving     Time  14    Period  Weeks    Status  Achieved            Plan - 07/09/18 1042    Clinical Impression Statement  reduced muscle tension and reported tightness after IASTM today. Fatigued in biceps curls with supination but was able to perform 15 with good form. PROM WFL and able to stretch into full flexion with pulleys.     PT Treatment/Interventions  ADLs/Self Care Home Management;Cryotherapy;Electrical Stimulation;Ultrasound;Moist Heat;Iontophoresis 36m/ml Dexamethasone;Therapeutic activities;Therapeutic exercise;Patient/family education;Manual techniques;Scar mobilization;Passive range of motion;Taping;Dry needling;Functional mobility training    PT Next Visit Plan  progress strengthening as tolerated- deltoid, reducing upper trap use, CKC strengthening,  pt requests DN at next appointment    PT Home Exercise Plan  shoulder flexion,  cervical rotation, cervical retraction, supine chest press to 90, sidelying ER; UE weight shift on counter    Consulted and Agree with Plan of Care  Patient       Patient will benefit from skilled therapeutic intervention in order to improve the following deficits and impairments:  Pain, Improper body mechanics, Postural dysfunction, Increased muscle spasms, Decreased activity tolerance, Decreased range of motion, Decreased strength, Impaired UE functional use  Visit Diagnosis: 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 CKD (chronic kidney disease), stage III ([place] 05/06/2018  . Seizure ([place] 05/06/2018  . Normocytic anemia 05/06/2018  . Leukopenia 05/06/2018  . AKI (acute kidney injury) ([place]   . Acute kidney injury ([place] 04/27/2018  . Abdominal pain 04/27/2018  . Chronic anticoagulation 04/27/2018  . Left knee pain 01/10/2018  . Numbness and tingling in both hands 07/31/2017  . Gastroesophageal reflux disease without esophagitis 07/31/2017  . Chronic pain disorder 06/05/2017  . History of pulmonary embolus (PE) 02/14/2017  . Diabetes mellitus type 2 in nonobese (HCC)   . Cocaine abuse ([place]   . Depression   . History of CVA (cerebrovascular accident)   . Cervical spondylosis with radiculopathy 01/24/2017  . Dyslipidemia 03/15/2016  . Complete heart block ([place] 03/14/2016  .  Cervical spinal stenosis 03/14/2016  . CAD-50% LAD 2011 12/14/2014  . Gout 11/16/2014  . Polysubstance abuse ([place]) 01/15/2013  . DIZZINESS 06/05/2010  . Essential hypertension 05/18/2009     C.  PT, DPT 07/09/18 10:55 AM   [facility] [address] [place], [place], [address] Phone: [phone]   Fax:  [phone]  Name: Yonatan Guitron MRN: [id number] Date of Birth:  [birth year]  PHYSICAL THERAPY DISCHARGE SUMMARY  Visits from Start of Care: 5  Current functional level related to goals / functional outcomes: See above   Remaining deficits: See above   Education / Equipment: Anatomy of condition, POC, HEP, exercise form/rationale  Plan: Patient agrees to discharge.  Patient goals were not met. Patient is being discharged due to not returning since the last visit.  ?????   Did not return within MCD authorization.     C.  PT, DPT 07/30/18 11:50 AM

## 2018-07-15 ENCOUNTER — Ambulatory Visit: Payer: Medicaid Other | Admitting: Physical Therapy

## 2018-07-17 ENCOUNTER — Ambulatory Visit: Payer: Medicaid Other | Admitting: Physical Therapy

## 2018-07-17 ENCOUNTER — Telehealth: Payer: Self-pay | Admitting: Physical Therapy

## 2018-07-17 NOTE — Telephone Encounter (Signed)
Called patient about missed visit today.  He has oral surgery and is not doing well.  He expects to be able to attend the next appointment on the 30 th.  I suggested he contact his Oral surgeon since he is not doing well.  Liz Beach  PTA

## 2018-07-21 ENCOUNTER — Telehealth: Payer: Self-pay | Admitting: Physical Therapy

## 2018-07-21 ENCOUNTER — Ambulatory Visit: Payer: Medicaid Other | Admitting: Physical Therapy

## 2018-07-21 NOTE — Telephone Encounter (Signed)
Left message regarding No Show today. Advised that today is the last day of MCD auth and requires re-eval with PT. Requested that he contact us to schedule re-eval or request d/c.  Indigo Barbian C. Loretha Ure PT, DPT 07/21/18 10:59 AM

## 2018-07-28 ENCOUNTER — Encounter: Payer: Self-pay | Admitting: Pharmacist

## 2018-07-28 ENCOUNTER — Other Ambulatory Visit: Payer: Self-pay | Admitting: Internal Medicine

## 2018-07-28 DIAGNOSIS — K219 Gastro-esophageal reflux disease without esophagitis: Secondary | ICD-10-CM

## 2018-07-29 MED FILL — OMEPRAZOLE 20 MG CAP: 20 | 30 days supply | Qty: 30 | Fill #0

## 2018-07-29 NOTE — Telephone Encounter (Signed)
Contacted pt and left a detailed vm informing pt that we need to verify which reflux medicine he is on and if he can give me a call at his earliest convenience

## 2018-07-30 ENCOUNTER — Emergency Department (HOSPITAL_COMMUNITY)
Admission: EM | Admit: 2018-07-30 | Discharge: 2018-07-30 | Disposition: A | Discharge status: Home / Self Care | Payer: Medicaid Other | Attending: Emergency Medicine | Admitting: Emergency Medicine

## 2018-07-30 ENCOUNTER — Encounter (HOSPITAL_COMMUNITY): Payer: Self-pay | Admitting: Emergency Medicine

## 2018-07-30 ENCOUNTER — Other Ambulatory Visit: Payer: Self-pay

## 2018-07-30 ENCOUNTER — Emergency Department (HOSPITAL_COMMUNITY): Payer: Medicaid Other

## 2018-07-30 DIAGNOSIS — I129 Hypertensive chronic kidney disease with stage 1 through stage 4 chronic kidney disease, or unspecified chronic kidney disease: Secondary | ICD-10-CM | POA: Insufficient documentation

## 2018-07-30 DIAGNOSIS — I259 Chronic ischemic heart disease, unspecified: Secondary | ICD-10-CM | POA: Insufficient documentation

## 2018-07-30 DIAGNOSIS — Z79899 Other long term (current) drug therapy: Secondary | ICD-10-CM | POA: Insufficient documentation

## 2018-07-30 DIAGNOSIS — R1011 Right upper quadrant pain: Secondary | ICD-10-CM | POA: Insufficient documentation

## 2018-07-30 DIAGNOSIS — E1122 Type 2 diabetes mellitus with diabetic chronic kidney disease: Secondary | ICD-10-CM | POA: Insufficient documentation

## 2018-07-30 DIAGNOSIS — N183 Chronic kidney disease, stage 3 (moderate): Secondary | ICD-10-CM | POA: Diagnosis not present

## 2018-07-30 DIAGNOSIS — R109 Unspecified abdominal pain: Secondary | ICD-10-CM | POA: Diagnosis present

## 2018-07-30 LAB — URINALYSIS, ROUTINE W REFLEX MICROSCOPIC
Bilirubin Urine: NEGATIVE
GLUCOSE, UA: NEGATIVE mg/dL
Hgb urine dipstick: NEGATIVE
KETONES UR: NEGATIVE mg/dL
LEUKOCYTES UA: NEGATIVE
Nitrite: NEGATIVE
PH: 6 (ref 5.0–8.0)
PROTEIN: NEGATIVE mg/dL
Specific Gravity, Urine: 1.012 (ref 1.005–1.030)

## 2018-07-30 LAB — COMPREHENSIVE METABOLIC PANEL
ALK PHOS: 50 U/L (ref 38–126)
ALT: 24 U/L (ref 0–44)
ANION GAP: 6 (ref 5–15)
AST: 22 U/L (ref 15–41)
Albumin: 3.9 g/dL (ref 3.5–5.0)
BILIRUBIN TOTAL: 0.8 mg/dL (ref 0.3–1.2)
BUN: 20 mg/dL (ref 8–23)
CALCIUM: 9.5 mg/dL (ref 8.9–10.3)
CO2: 27 mmol/L (ref 22–32)
Chloride: 109 mmol/L (ref 98–111)
Creatinine, Ser: 1.33 mg/dL — ABNORMAL HIGH (ref 0.61–1.24)
GFR calc Af Amer: >60 mL/min (ref >60)
GFR, EST NON AFRICAN AMERICAN: 55 mL/min — AB (ref >60)
Glucose, Bld: 100 mg/dL — ABNORMAL HIGH (ref 70–99)
POTASSIUM: 4.6 mmol/L (ref 3.5–5.1)
Sodium: 142 mmol/L (ref 135–145)
TOTAL PROTEIN: 7.1 g/dL (ref 6.5–8.1)

## 2018-07-30 LAB — CBC WITH DIFFERENTIAL/PLATELET
Abs Immature Granulocytes: 0.02 10*3/uL (ref 0.00–0.07)
BASOS PCT: 0 %
Basophils Absolute: 0 10*3/uL (ref 0.0–0.1)
EOS ABS: 0.1 10*3/uL (ref 0.0–0.5)
EOS PCT: 1 %
HEMATOCRIT: 47.1 % (ref 39.0–52.0)
Hemoglobin: 14.8 g/dL (ref 13.0–17.0)
IMMATURE GRANULOCYTES: 1 %
LYMPHS ABS: 1.2 10*3/uL (ref 0.7–4.0)
Lymphocytes Relative: 33 %
MCH: 28.1 pg (ref 26.0–34.0)
MCHC: 31.4 g/dL (ref 30.0–36.0)
MCV: 89.4 fL (ref 80.0–100.0)
MONOS PCT: 12 %
Monocytes Absolute: 0.4 10*3/uL (ref 0.1–1.0)
NEUTROS PCT: 53 %
Neutro Abs: 1.9 10*3/uL (ref 1.7–7.7)
PLATELETS: 200 10*3/uL (ref 150–400)
RBC: 5.27 MIL/uL (ref 4.22–5.81)
RDW: 14.6 % (ref 11.5–15.5)
WBC: 3.5 10*3/uL — ABNORMAL LOW (ref 4.0–10.5)
nRBC: 0 % (ref 0.0–0.2)

## 2018-07-30 LAB — LIPASE, BLOOD: LIPASE: 30 U/L (ref 11–51)

## 2018-07-30 MED ORDER — ONDANSETRON 8 MG PO TBDP: 8.0000 mg | ORAL_TABLET | Freq: Three times a day (TID) | ORAL | 0 refills | Status: DC | PRN

## 2018-07-30 MED ORDER — SODIUM CHLORIDE 0.9 % IV SOLN
INTRAVENOUS | Status: DC
  Administered 2018-07-30: 18:00:00 via INTRAVENOUS

## 2018-07-30 MED ORDER — MORPHINE SULFATE (PF) 4 MG/ML IV SOLN
4.0000 mg | Freq: Once | INTRAVENOUS | Status: AC
  Administered 2018-07-30: 4 mg via INTRAVENOUS
  Filled 2018-07-30: qty 1

## 2018-07-30 MED ORDER — OMEPRAZOLE 20 MG PO CPDR: 20.0000 mg | DELAYED_RELEASE_CAPSULE | Freq: Every day | ORAL | 0 refills | Status: DC

## 2018-07-30 MED ORDER — SODIUM CHLORIDE 0.9 % IV BOLUS
1000.0000 mL | Freq: Once | INTRAVENOUS | Status: AC
  Administered 2018-07-30: 1000 mL via INTRAVENOUS

## 2018-07-30 MED ORDER — ONDANSETRON HCL 4 MG/2ML IJ SOLN
4.0000 mg | Freq: Once | INTRAMUSCULAR | Status: AC
  Administered 2018-07-30: 4 mg via INTRAVENOUS
  Filled 2018-07-30: qty 2

## 2018-07-30 NOTE — ED Provider Notes (Signed)
[facility]-EMERGENCY DEPT Provider Note   CSN: [id number] Arrival date & time: 07/30/18  1417     History   Chief Complaint Chief Complaint  Patient presents with  . Abdominal Pain    HPI Russell Carter is a 62 y.o. male.  HPI Pt started having pain in his abdomen 3 days ago.  THe pain is in his upper abdomen.  THe pain stays in that area but his groin is also painful.  He has vomited a couple of times.  No fevers.  He has had some loose stools over the last few days, 6 episodes per day.  SOme dysuria.  No hx of gallbladder or appendix surgery.  He did have a hernia repair in the past and has not noticed any swelling in the area.   Pt had similar symptoms in July of this year.  Cause of his pain unknown, 2 CT scans were done in July. Past Medical History:  Diagnosis Date  . Acute kidney injury (HCC) 04/27/2018  . AKI (acute kidney injury) (HCC)   . Anginal pain (HCC)   . Anxiety   . Arthritis   . Bipolar disorder (HCC)   . Blood in stool   . CAD (coronary artery disease)    a. 2011: cath showing mod non-obstructive disease b. 02/2016: cath with 95% stenosis in the Mid RCA. DES placed.  . Cervical stenosis of spine   . Chronic anticoagulation 04/27/2018  . Chronic pain disorder 06/05/2017  . CKD (chronic kidney disease), stage III (HCC) 05/06/2018  . Cocaine abuse (HCC)    history  . Complete heart block (HCC) 03/14/2016  . Depression   . Diabetes mellitus type 2 in nonobese (HCC)   . Diabetes mellitus without complication (HCC)   . Dyslipidemia 03/15/2016  . Essential hypertension 05/18/2009   Qualifier: Diagnosis of  By: Kem Parkinson    . Gastroesophageal reflux disease without esophagitis 07/31/2017  . Gout, unspecified   . Headache   . History of CVA (cerebrovascular accident)   . History of pulmonary embolus (PE) 02/14/2017  . HTN (hypertension)   . Hypercholesterolemia   . Hyperglycemia   . Leukopenia 05/06/2018  . Migraines   . Myocardial  infarction [facility]) 2008   drug cocaine and marijuana at time of mi none  since    . Normocytic anemia 05/06/2018  . Pneumonia   . Polysubstance abuse (HCC) 01/15/2013  . Rectal bleeding   . Renal insufficiency   . Seizure (HCC) 05/06/2018  . Seizures (HCC)   . Stroke [facility])     Patient Active Problem List   Diagnosis Date Noted  . CKD (chronic kidney disease), stage III (HCC) 05/06/2018  . Seizure (HCC) 05/06/2018  . Normocytic anemia 05/06/2018  . Leukopenia 05/06/2018  . AKI (acute kidney injury) (HCC)   . Acute kidney injury (HCC) 04/27/2018  . Abdominal pain 04/27/2018  . Chronic anticoagulation 04/27/2018  . Left knee pain 01/10/2018  . Numbness and tingling in both hands 07/31/2017  . Gastroesophageal reflux disease without esophagitis 07/31/2017  . Chronic pain disorder 06/05/2017  . History of pulmonary embolus (PE) 02/14/2017  . Diabetes mellitus type 2 in nonobese (HCC)   . Cocaine abuse (HCC)   . Depression   . History of CVA (cerebrovascular accident)   . Cervical spondylosis with radiculopathy 01/24/2017  . Dyslipidemia 03/15/2016  . Complete heart block (HCC) 03/14/2016  . Cervical spinal stenosis 03/14/2016  . CAD-50% LAD 2011 12/14/2014  . Gout 11/16/2014  .  Polysubstance abuse (HCC) 01/15/2013  . DIZZINESS 06/05/2010  . Essential hypertension 05/18/2009    Past Surgical History:  Procedure Laterality Date  . ANTERIOR CERVICAL DECOMP/DISCECTOMY FUSION N/A 01/24/2017   Procedure: Cervical Four-Five,Cervical Five Six,Cervical Six-Seven Anterior cervical discectomy with fusion and plate with Cervical Three-Thoracic- One Laminectomy/Foraminotomy with Cervical Three-Thoracic- One Fixation and fusion;  Surgeon: Loura Halt Ditty, MD;  Location: [facility] OR;  Service: Neurosurgery;  Laterality: N/A;  Part-1 anterior approach  . CARDIAC CATHETERIZATION  2008; ~ 2011   -40%lad  . CARDIAC CATHETERIZATION N/A 03/16/2016   Procedure: Left Heart Cath and Coronary  Angiography;  Surgeon: Marykay Lex, MD;  Location: [facility] INVASIVE CV LAB;  Service: Cardiovascular;  Laterality: N/A;  . COLONOSCOPY WITH PROPOFOL N/A 04/17/2018   Procedure: COLONOSCOPY WITH PROPOFOL;  Surgeon: Graylin Shiver, MD;  Location: [facility] ENDOSCOPY;  Service: Endoscopy;  Laterality: N/A;  . CORONARY ANGIOPLASTY     STENTS  . EYE SURGERY Bilateral    "for double vision"  . INGUINAL HERNIA REPAIR  05/12/2012   Procedure: HERNIA REPAIR INGUINAL ADULT;  Surgeon: Liz Malady, MD;  Location: [facility];  Service: General;  Laterality: Right;  . INGUINAL HERNIA REPAIR Left 02/02/2003   Dr. Violeta Gelinas. repair with mesh. Same procedure done again in  05/22/2004   . INGUINAL HERNIA REPAIR Left ?date 2nd OR  . LEFT HEART CATH AND CORONARY ANGIOGRAPHY N/A 04/29/2018   Procedure: LEFT HEART CATH AND CORONARY ANGIOGRAPHY;  Surgeon: Swaziland, Peter M, MD;  Location: [facility] INVASIVE CV LAB;  Service: Cardiovascular;  Laterality: N/A;  . POSTERIOR CERVICAL FUSION/FORAMINOTOMY N/A 01/24/2017   Procedure: Cervical Three-Thoracic One-Laminectomy/Foraminotomy with Cervical Three-Thoracic One Fixation and fusion;  Surgeon: Loura Halt Ditty, MD;  Location: [facility] OR;  Service: Neurosurgery;  Laterality: N/A;  Part-2 Posterior approach  . RECONSTRUCT / STABILIZE DISTAL ULNA    . SHOULDER OPEN ROTATOR CUFF REPAIR Left 2010  . SHOULDER SURGERY Left    "injured on the job; had to cut small bone out"  . THUMB FUSION Right         Home Medications    Prior to Admission medications   Medication Sig Start Date End Date Taking? Authorizing Provider  acetaminophen (TYLENOL) 500 MG tablet Take 1,000 mg by mouth every 6 (six) hours as needed for headache (pain).   Yes [provider]  allopurinol (ZYLOPRIM) 300 MG tablet TAKE 1 TABLET (300 MG TOTAL) BY MOUTH DAILY. 05/26/18  Yes Hoy Register, MD  amoxicillin (AMOXIL) 500 MG capsule Take 1 capsule (500 mg total) by mouth 3 (three) times daily.  05/20/18  Yes Hoy Register, MD  Calcium Carb-Cholecalciferol (CALCIUM-VITAMIN D) 600-400 MG-UNIT TABS Take 1 tablet by mouth daily. 05/07/18  Yes Emokpae, Courage, MD  clotrimazole (LOTRIMIN) 1 % cream Apply 1 application topically 2 (two) times daily.   Yes [provider]  divalproex (DEPAKOTE) 500 MG DR tablet Take 1 tablet (500 mg total) by mouth 2 (two) times daily. 05/07/18  Yes Emokpae, Courage, MD  DULoxetine (CYMBALTA) 30 MG capsule Take 1 capsule (30 mg total) by mouth 2 (two) times daily. 05/07/18  Yes Emokpae, Courage, MD  folic acid (FOLVITE) 1 MG tablet Take 1 tablet (1 mg total) by mouth daily. 05/07/18  Yes Emokpae, Courage, MD  gabapentin (NEURONTIN) 400 MG capsule Take 1 capsule (400 mg total) by mouth 2 (two) times daily. 05/07/18  Yes Emokpae, Courage, MD  gabapentin (NEURONTIN) 600 MG tablet Take 600 mg by mouth  4 (four) times daily. 07/24/18  Yes [provider]  hydrOXYzine (ATARAX/VISTARIL) 25 MG tablet Take 1 tablet (25 mg total) by mouth every 6 (six) hours as needed. for anxiety 05/07/18  Yes Emokpae, Courage, MD  LORazepam (ATIVAN) 0.5 MG tablet Take 1 tablet (0.5 mg total) by mouth every 8 (eight) hours as needed for anxiety. 05/07/18 05/07/19 Yes Emokpae, Courage, MD  losartan (COZAAR) 100 MG tablet Take 1 tablet (100 mg total) by mouth daily. 05/07/18  Yes Shon Hale, MD  meclizine (ANTIVERT) 25 MG tablet Take 1 tablet (25 mg total) by mouth daily as needed for dizziness or nausea (vertigo). 06/13/18  Yes Marcine Matar, MD  methocarbamol (ROBAXIN) 750 MG tablet Take 1 tablet (750 mg total) by mouth every 8 (eight) hours as needed for muscle spasms. 05/07/18  Yes Emokpae, Courage, MD  metoCLOPramide (REGLAN) 10 MG tablet TAKE 1 TABLET BY MOUTH EVERY 6 HOURS AS NEEDED FOR NAUSEA OR HEADACHE. Patient taking differently: Take 10 mg by mouth every 6 (six) hours as needed for nausea (headache).  06/13/18  Yes Marcine Matar, MD  Multiple Vitamin  (MULTIVITAMIN WITH MINERALS) TABS tablet Take 1 tablet by mouth daily. 05/07/18  Yes Emokpae, Courage, MD  nitroGLYCERIN (NITROSTAT) 0.4 MG SL tablet Place 1 tablet (0.4 mg total) under the tongue every 5 (five) minutes as needed for chest pain. 05/02/17  Yes Quentin Angst, MD  oxyCODONE-acetaminophen (PERCOCET) 7.5-325 MG tablet Take 1 tablet by mouth every 4 (four) hours as needed for severe pain.   Yes [provider]  rosuvastatin (CRESTOR) 40 MG tablet Take 1 tablet (40 mg total) by mouth daily. 05/07/18  Yes Shon Hale, MD  traZODone (DESYREL) 100 MG tablet Take 1 tablet (100 mg total) by mouth at bedtime as needed for sleep. 05/07/18  Yes Emokpae, Courage, MD  warfarin (COUMADIN) 5 MG tablet Take as directed - 1 tab PO Q M/W/F and 1/2 tab all other days. 06/13/18  Yes Marcine Matar, MD  acetaminophen (TYLENOL) 325 MG tablet Take 2 tablets (650 mg total) by mouth every 6 (six) hours as needed for mild pain (or Fever >/= 101). Patient not taking: Reported on 07/30/2018 05/07/18   Shon Hale, MD  omeprazole (PRILOSEC) 20 MG capsule Take 1 capsule (20 mg total) by mouth daily for 14 days. 07/30/18 08/13/18  Linwood Dibbles, MD  ondansetron (ZOFRAN ODT) 8 MG disintegrating tablet Take 1 tablet (8 mg total) by mouth every 8 (eight) hours as needed for nausea or vomiting. 07/30/18   Linwood Dibbles, MD  ondansetron (ZOFRAN) 4 MG tablet Take 1 tablet (4 mg total) by mouth every 6 (six) hours as needed for nausea. Patient not taking: Reported on 07/30/2018 05/07/18   Shon Hale, MD    Family History Family History  Problem Relation Age of Onset  . Heart attack Mother        x7. Still alive  . Heart attack Father        deceased b/c of MI  . Heart attack Brother        older, deceased b/c of MI   . Cancer Brother        bone cancer in 1 brother, ? brain cancer in other brother    Social History Social History   Tobacco Use  . Smoking status: Never Smoker  . Smokeless  tobacco: Never Used  Substance Use Topics  . Alcohol use: Yes    Comment: 16 oz beer every 2-3 days  .  Drug use: Yes    Types: "Crack" cocaine, Marijuana, Cocaine    Comment:  "quit 2008"     Allergies   Shellfish allergy   Review of Systems Review of Systems  All other systems reviewed and are negative.    Physical Exam Updated Vital Signs BP (!) 164/99   Pulse 78   Temp 98.3 F (36.8 C) (Oral)   Resp 16   SpO2 100%   Physical Exam  Constitutional: He appears well-developed and well-nourished. No distress.  HENT:  Head: Normocephalic and atraumatic.  Right Ear: External ear normal.  Left Ear: External ear normal.  Eyes: Conjunctivae are normal. Right eye exhibits no discharge. Left eye exhibits no discharge. No scleral icterus.  Neck: Neck supple. No tracheal deviation present.  Cardiovascular: Normal rate, regular rhythm and intact distal pulses.  Pulmonary/Chest: Effort normal and breath sounds normal. No stridor. No respiratory distress. He has no wheezes. He has no rales.  Abdominal: Soft. Bowel sounds are normal. He exhibits no distension. There is tenderness in the right upper quadrant. There is guarding. There is no rigidity and no rebound. No hernia.  Genitourinary:  Genitourinary Comments: No inguinal hernia, no lymphadenopathy, mild ttp inguinal region  Musculoskeletal: He exhibits no edema or tenderness.  Neurological: He is alert. He has normal strength. No cranial nerve deficit (no facial droop, extraocular movements intact, no slurred speech) or sensory deficit. He exhibits normal muscle tone. He displays no seizure activity. Coordination normal.  Skin: Skin is warm and dry. No rash noted.  Psychiatric: He has a normal mood and affect.  Nursing note and vitals reviewed.    ED Treatments / Results  Labs (all labs ordered are listed, but only abnormal results are displayed) Labs Reviewed  COMPREHENSIVE METABOLIC PANEL - Abnormal; Notable for the  following components:      Result Value   Glucose, Bld 100 (*)    Creatinine, Ser 1.33 (*)    GFR calc non Af Amer 55 (*)    All other components within normal limits  CBC WITH DIFFERENTIAL/PLATELET - Abnormal; Notable for the following components:   WBC 3.5 (*)    All other components within normal limits  LIPASE, BLOOD  URINALYSIS, ROUTINE W REFLEX MICROSCOPIC    EKG None  Radiology US Abdomen Limited  Result Date: 07/30/2018 CLINICAL DATA:  Upper abdominal pain for several days EXAM: ULTRASOUND ABDOMEN LIMITED RIGHT UPPER QUADRANT COMPARISON:  None. FINDINGS: Gallbladder: No gallstones or wall thickening visualized. No sonographic Murphy sign noted by sonographer. Common bile duct: Diameter: 4.3 mm. Liver: No focal lesion identified. Within normal limits in parenchymal echogenicity. Portal vein is patent on color Doppler imaging with normal direction of blood flow towards the liver. IMPRESSION: No acute abnormality noted. Electronically Signed   By: Alcide Clever M.D.   On: 07/30/2018 18:43    Procedures Procedures (including critical care time)  Medications Ordered in ED Medications  sodium chloride 0.9 % bolus 1,000 mL (0 mLs Intravenous Stopped 07/30/18 1731)    And  0.9 %  sodium chloride infusion ( Intravenous New Bag/Given 07/30/18 1747)  ondansetron (ZOFRAN) injection 4 mg (4 mg Intravenous Given 07/30/18 1609)  morphine 4 MG/ML injection 4 mg (4 mg Intravenous Given 07/30/18 1609)     Initial Impression / Assessment and Plan / ED Course  I have reviewed the triage vital signs and the nursing notes.  Pertinent labs & imaging results that were available during my care of the patient were reviewed by  me and considered in my medical decision making (see chart for details).   Patient presented to the emergency room for evaluation of abdominal pain.  Laboratory tests were normal.  Ultrasound does not show any evidence of acute gallbladder disease.  Previous CT scans in July  did not show any evidence of chronic liver disease including ascites as initially indicated in the nursing notes.  Plan on discharge home with antacids.  Discussed outpatient follow-up with his primary care doctor.  Return to the ED for fever worsening symptoms  Final Clinical Impressions(s) / ED Diagnoses   Final diagnoses:  Right upper quadrant abdominal pain    ED Discharge Orders         Ordered    omeprazole (PRILOSEC) 20 MG capsule  Daily     07/30/18 1935    ondansetron (ZOFRAN ODT) 8 MG disintegrating tablet  Every 8 hours PRN     07/30/18 1936           Linwood Dibbles, MD 07/30/18 1937

## 2018-07-30 NOTE — ED Triage Notes (Signed)
Pt arrived via [facility] from home. N/V/D progressively worse over the last 3 days. Abdomen has been getting distended over the alst 3 days. RUQ pain.  Hx of kidney, liver failure & ascites. 4mg  of zofran to help with nausea 18g LAC

## 2018-07-30 NOTE — Discharge Instructions (Signed)
Take the medications as prescribed, follow-up with your primary care doctor in a few days to make sure your symptoms are improving, return as needed for worsening symptoms including fever

## 2018-07-30 NOTE — ED Notes (Signed)
Lowry Bowl (fiance) # [phone]

## 2018-08-06 ENCOUNTER — Ambulatory Visit: Payer: Medicaid Other | Attending: Internal Medicine | Admitting: Pharmacist

## 2018-08-06 DIAGNOSIS — Z86711 Personal history of pulmonary embolism: Secondary | ICD-10-CM

## 2018-08-06 DIAGNOSIS — Z7901 Long term (current) use of anticoagulants: Secondary | ICD-10-CM | POA: Insufficient documentation

## 2018-08-06 LAB — POCT INR: INR: 1.8 — AB (ref 2.0–3.0)

## 2018-08-07 ENCOUNTER — Other Ambulatory Visit: Payer: Self-pay | Admitting: Pharmacist

## 2018-08-07 MED ORDER — HYDROXYZINE HCL 25 MG PO TABS: 25.0000 mg | ORAL_TABLET | Freq: Four times a day (QID) | ORAL | 2 refills | Status: DC | PRN

## 2018-08-07 MED ORDER — HYDROXYZINE HCL 25 MG PO TABS: 25.0000 mg | ORAL_TABLET | Freq: Four times a day (QID) | ORAL | 0 refills | Status: DC | PRN

## 2018-08-07 MED FILL — GABAPENTIN 400 MG CAPSULE: 400 | 30 days supply | Qty: 60 | Fill #2

## 2018-08-07 MED FILL — METOCLOPRAMIDE 10 MG TABLET: 10 | 3 days supply | Qty: 10 | Fill #1

## 2018-08-07 MED FILL — WARFARIN SODIUM 5 MG TABLET: 5 | 30 days supply | Qty: 30 | Fill #1

## 2018-08-07 MED FILL — ALLOPURINOL 300 MG TAB: 300 | 30 days supply | Qty: 30 | Fill #2

## 2018-08-07 MED FILL — traZODone HCL 100 MG TABS: 100 | 30 days supply | Qty: 30 | Fill #2

## 2018-08-07 MED FILL — hydrOXYzine HCL 25 MG TABS: 25 | 7 days supply | Qty: 30 | Fill #0

## 2018-08-07 MED FILL — FOLIC ACID 1 MG TABS: 1 | 30 days supply | Qty: 30 | Fill #2

## 2018-08-07 MED FILL — MECLIZINE 25 MG TABLET: 25 | 30 days supply | Qty: 30 | Fill #0

## 2018-08-07 MED FILL — METHOCARBAMOL 750 MG TABS: 750 | 30 days supply | Qty: 90 | Fill #1

## 2018-08-07 MED FILL — DIVALPROEX SOD DR 500 MG TA: 500 | 30 days supply | Qty: 60 | Fill #2

## 2018-08-12 ENCOUNTER — Telehealth: Payer: Self-pay | Admitting: Internal Medicine

## 2018-08-12 NOTE — Telephone Encounter (Signed)
Application is placed in pcp fax folder to filled out. Will contact pt once it's filled out

## 2018-08-12 NOTE — Telephone Encounter (Signed)
Pt came in and filled out a parking [employer]ac[employer] application. Application was left in PCP inbox, please follow up when it is ready for pickup

## 2018-08-14 ENCOUNTER — Ambulatory Visit: Payer: Medicaid Other | Admitting: Neurology

## 2018-08-14 ENCOUNTER — Encounter

## 2018-08-14 ENCOUNTER — Ambulatory Visit: Payer: Medicaid Other | Attending: Internal Medicine | Admitting: Pharmacist

## 2018-08-14 ENCOUNTER — Encounter: Payer: Self-pay | Admitting: Neurology

## 2018-08-14 ENCOUNTER — Other Ambulatory Visit: Payer: Self-pay

## 2018-08-14 VITALS — BP 152/94 | HR 94 | Resp 18 | Ht 71.0 in | Wt 213.0 lb

## 2018-08-14 DIAGNOSIS — R531 Weakness: Secondary | ICD-10-CM | POA: Insufficient documentation

## 2018-08-14 DIAGNOSIS — R569 Unspecified convulsions: Secondary | ICD-10-CM | POA: Diagnosis not present

## 2018-08-14 DIAGNOSIS — I129 Hypertensive chronic kidney disease with stage 1 through stage 4 chronic kidney disease, or unspecified chronic kidney disease: Secondary | ICD-10-CM | POA: Insufficient documentation

## 2018-08-14 DIAGNOSIS — N183 Chronic kidney disease, stage 3 (moderate): Secondary | ICD-10-CM | POA: Insufficient documentation

## 2018-08-14 DIAGNOSIS — R251 Tremor, unspecified: Secondary | ICD-10-CM | POA: Insufficient documentation

## 2018-08-14 DIAGNOSIS — E1122 Type 2 diabetes mellitus with diabetic chronic kidney disease: Secondary | ICD-10-CM | POA: Diagnosis not present

## 2018-08-14 DIAGNOSIS — Z86711 Personal history of pulmonary embolism: Secondary | ICD-10-CM

## 2018-08-14 LAB — POCT INR: INR: 3.7 — AB (ref 2.0–3.0)

## 2018-08-14 NOTE — Progress Notes (Signed)
Reason for visit: Possible seizures  Referring physician: [facility]  Russell Carter is a 62 y.o. male  History of present illness:  Russell Carter is a 62 year old right-handed black male with a history of polysubstance abuse who was seen through this office in October 2018 with left arm weakness.  The patient is found to have a severe left C5 and C6 radiculopathy.  Over time, this has gradually improved but he still has some weakness with elevating the left arm.  The patient indicates that he has had seizure type events since he was 62 years old, he describes the events as generalized jerking and tremors involving the arms and legs unassociated with loss of consciousness.  The patient is able to understand what is going on around him, he can talk to people during the event.  He would be unable to walk during an event that would last from 5 minutes to 15 minutes.  The patient had a similar such event on 05 May 2018.  The patient went to the emergency room, he apparently was on low-dose valproic acid for a mood stabilizer but he was increased to 500 mg twice daily as it was presumed that he was having a seizure.  The patient has been on this medication since that time, he has developed therapeutic levels of Depakote.  The patient does report headaches that occur relatively frequently, he has some headache that is brief and nature daily.  The patient reports ongoing left arm numbness.  He is sent to this office for an evaluation.  The patient does not operate a motor vehicle.  Past Medical History:  Diagnosis Date  . Acute kidney injury (HCC) 04/27/2018  . AKI (acute kidney injury) (HCC)   . Anginal pain (HCC)   . Anxiety   . Arthritis   . Bipolar disorder (HCC)   . Blood in stool   . CAD (coronary artery disease)    a. 2011: cath showing mod non-obstructive disease b. 02/2016: cath with 95% stenosis in the Mid RCA. DES placed.  . Cervical stenosis of spine   . Chronic anticoagulation 04/27/2018   . Chronic pain disorder 06/05/2017  . CKD (chronic kidney disease), stage III (HCC) 05/06/2018  . Cocaine abuse (HCC)    history  . Complete heart block (HCC) 03/14/2016  . Depression   . Diabetes mellitus type 2 in nonobese (HCC)   . Diabetes mellitus without complication (HCC)   . Dyslipidemia 03/15/2016  . Essential hypertension 05/18/2009   Qualifier: Diagnosis of  By: Kem Parkinson    . Gastroesophageal reflux disease without esophagitis 07/31/2017  . Gout, unspecified   . Headache   . History of CVA (cerebrovascular accident)   . History of pulmonary embolus (PE) 02/14/2017  . HTN (hypertension)   . Hypercholesterolemia   . Hyperglycemia   . Leukopenia 05/06/2018  . Migraines   . Myocardial infarction [facility]) 2008   drug cocaine and marijuana at time of mi none  since    . Normocytic anemia 05/06/2018  . Pneumonia   . Polysubstance abuse (HCC) 01/15/2013  . Rectal bleeding   . Renal insufficiency   . Seizure (HCC) 05/06/2018  . Seizures (HCC)   . Stroke [facility])     Past Surgical History:  Procedure Laterality Date  . ANTERIOR CERVICAL DECOMP/DISCECTOMY FUSION N/A 01/24/2017   Procedure: Cervical Four-Five,Cervical Five Six,Cervical Six-Seven Anterior cervical discectomy with fusion and plate with Cervical Three-Thoracic- One Laminectomy/Foraminotomy with Cervical Three-Thoracic- One Fixation and fusion;  Surgeon: Loura Halt Ditty, MD;  Location: [facility] OR;  Service: Neurosurgery;  Laterality: N/A;  Part-1 anterior approach  . CARDIAC CATHETERIZATION  2008; ~ 2011   [place]-40%lad  . CARDIAC CATHETERIZATION N/A 03/16/2016   Procedure: Left Heart Cath and Coronary Angiography;  Surgeon: Marykay Lex, MD;  Location: [facility] INVASIVE CV LAB;  Service: Cardiovascular;  Laterality: N/A;  . COLONOSCOPY WITH PROPOFOL N/A 04/17/2018   Procedure: COLONOSCOPY WITH PROPOFOL;  Surgeon: Graylin Shiver, MD;  Location: [facility] ENDOSCOPY;  Service: Endoscopy;  Laterality: N/A;  . CORONARY ANGIOPLASTY       STENTS  . EYE SURGERY Bilateral    "for double vision"  . INGUINAL HERNIA REPAIR  05/12/2012   Procedure: HERNIA REPAIR INGUINAL ADULT;  Surgeon: Liz Malady, MD;  Location: [facility];  Service: General;  Laterality: Right;  . INGUINAL HERNIA REPAIR Left 02/02/2003   Dr. Violeta Gelinas. repair with mesh. Same procedure done again in  05/22/2004   . INGUINAL HERNIA REPAIR Left ?date 2nd OR  . LEFT HEART CATH AND CORONARY ANGIOGRAPHY N/A 04/29/2018   Procedure: LEFT HEART CATH AND CORONARY ANGIOGRAPHY;  Surgeon: Swaziland, Peter M, MD;  Location: [facility] INVASIVE CV LAB;  Service: Cardiovascular;  Laterality: N/A;  . POSTERIOR CERVICAL FUSION/FORAMINOTOMY N/A 01/24/2017   Procedure: Cervical Three-Thoracic One-Laminectomy/Foraminotomy with Cervical Three-Thoracic One Fixation and fusion;  Surgeon: Loura Halt Ditty, MD;  Location: [facility] OR;  Service: Neurosurgery;  Laterality: N/A;  Part-2 Posterior approach  . RECONSTRUCT / STABILIZE DISTAL ULNA    . SHOULDER OPEN ROTATOR CUFF REPAIR Left 2010  . SHOULDER SURGERY Left    "injured on the job; had to cut small bone out"  . THUMB FUSION Right     Family History  Problem Relation Age of Onset  . Heart attack Mother        x7. Still alive  . Heart attack Father        deceased b/c of MI  . Heart attack Brother        older, deceased b/c of MI   . Cancer Brother        bone cancer in 1 brother, ? brain cancer in other brother  . Heart attack Brother   . Other Brother        MVA  . Other Brother   . Bone cancer Brother   . Cancer Brother   . SIDS Sister     Social history:  reports that he has never smoked. He has never used smokeless tobacco. He reports that he drinks alcohol. He reports that he has current or past drug history. Drugs: "Crack" cocaine, Marijuana, and Cocaine.  Medications:  Prior to Admission medications   Medication Sig Start Date End Date Taking? Authorizing Provider  acetaminophen (TYLENOL) 500 MG  tablet Take 1,000 mg by mouth every 6 (six) hours as needed for headache (pain).   Yes [provider]  allopurinol (ZYLOPRIM) 300 MG tablet TAKE 1 TABLET (300 MG TOTAL) BY MOUTH DAILY. 05/26/18  Yes Hoy Register, MD  Calcium Carb-Cholecalciferol (CALCIUM-VITAMIN D) 600-400 MG-UNIT TABS Take 1 tablet by mouth daily. 05/07/18  Yes Emokpae, Courage, MD  clotrimazole (LOTRIMIN) 1 % cream Apply 1 application topically 2 (two) times daily.   Yes [provider]  divalproex (DEPAKOTE) 500 MG DR tablet Take 1 tablet (500 mg total) by mouth 2 (two) times daily. 05/07/18  Yes Emokpae, Courage, MD  DULoxetine (CYMBALTA) 30 MG capsule Take 1 capsule (30 mg  total) by mouth 2 (two) times daily. 05/07/18  Yes Emokpae, Courage, MD  folic acid (FOLVITE) 1 MG tablet Take 1 tablet (1 mg total) by mouth daily. 05/07/18  Yes Emokpae, Courage, MD  gabapentin (NEURONTIN) 600 MG tablet Take 600 mg by mouth 4 (four) times daily. 07/24/18  Yes [provider]  hydrOXYzine (ATARAX/VISTARIL) 25 MG tablet Take 1 tablet (25 mg total) by mouth every 6 (six) hours as needed. for anxiety 08/07/18  Yes Marcine Matar, MD  LORazepam (ATIVAN) 0.5 MG tablet Take 1 tablet (0.5 mg total) by mouth every 8 (eight) hours as needed for anxiety. 05/07/18 05/07/19 Yes Emokpae, Courage, MD  losartan (COZAAR) 100 MG tablet Take 1 tablet (100 mg total) by mouth daily. 05/07/18  Yes Shon Hale, MD  meclizine (ANTIVERT) 25 MG tablet Take 1 tablet (25 mg total) by mouth daily as needed for dizziness or nausea (vertigo). 06/13/18  Yes Marcine Matar, MD  methocarbamol (ROBAXIN) 750 MG tablet Take 1 tablet (750 mg total) by mouth every 8 (eight) hours as needed for muscle spasms. 05/07/18  Yes Emokpae, Courage, MD  metoCLOPramide (REGLAN) 10 MG tablet TAKE 1 TABLET BY MOUTH EVERY 6 HOURS AS NEEDED FOR NAUSEA OR HEADACHE. Patient taking differently: Take 10 mg by mouth every 6 (six) hours as needed for nausea (headache).   06/13/18  Yes Marcine Matar, MD  Multiple Vitamin (MULTIVITAMIN WITH MINERALS) TABS tablet Take 1 tablet by mouth daily. 05/07/18  Yes Emokpae, Courage, MD  nitroGLYCERIN (NITROSTAT) 0.4 MG SL tablet Place 1 tablet (0.4 mg total) under the tongue every 5 (five) minutes as needed for chest pain. 05/02/17  Yes Quentin Angst, MD  oxyCODONE-acetaminophen (PERCOCET) 7.5-325 MG tablet Take 1 tablet by mouth every 4 (four) hours as needed for severe pain.   Yes [provider]  rosuvastatin (CRESTOR) 40 MG tablet Take 1 tablet (40 mg total) by mouth daily. 05/07/18  Yes Shon Hale, MD  traZODone (DESYREL) 100 MG tablet Take 1 tablet (100 mg total) by mouth at bedtime as needed for sleep. 05/07/18  Yes Emokpae, Courage, MD  warfarin (COUMADIN) 5 MG tablet Take as directed - 1 tab PO Q M/W/F and 1/2 tab all other days. 06/13/18  Yes Marcine Matar, MD  acetaminophen (TYLENOL) 325 MG tablet Take 2 tablets (650 mg total) by mouth every 6 (six) hours as needed for mild pain (or Fever >/= 101). Patient not taking: Reported on 08/14/2018 05/07/18   Shon Hale, MD  gabapentin (NEURONTIN) 400 MG capsule Take 1 capsule (400 mg total) by mouth 2 (two) times daily. Patient not taking: Reported on 08/14/2018 05/07/18   Shon Hale, MD  omeprazole (PRILOSEC) 20 MG capsule Take 1 capsule (20 mg total) by mouth daily for 14 days. 07/30/18 08/13/18  Linwood Dibbles, MD      Allergies  Allergen Reactions  . Shellfish Allergy Anaphylaxis    ROS:  Out of a complete 14 system review of symptoms, the patient complains only of the following symptoms, and all other reviewed systems are negative.  Tremors, seizure Headache Weight gain, fatigue Chest pain Hearing loss, ringing in the ears Itching Blurred vision, double vision Shortness of breath, cough, snoring Urination problems Easy bruising Increased thirst Joint swelling, muscle cramps, aching muscles Allergies, runny nose Memory  loss, confusion, numbness, weakness, dizziness, seizures Depression, anxiety, decreased energy, disinterest in activities, suicidal thoughts Restless legs  Blood pressure (!) 152/94, pulse 94, resp. rate 18, height 5\' 11"  (1.803  m), weight 213 lb (96.6 kg).  Physical Exam  General: The patient is alert and cooperative at the time of the examination.  Eyes: Pupils are equal, round, and reactive to light. Discs are flat bilaterally.  Neck: The neck is supple, no carotid bruits are noted.  Respiratory: The respiratory examination is clear.  Cardiovascular: The cardiovascular examination reveals a regular rate and rhythm, no obvious murmurs or rubs are noted.  Skin: Extremities are without significant edema.  Neurologic Exam  Mental status: The patient is alert and oriented x 3 at the time of the examination. The patient has apparent normal recent and remote memory, with an apparently normal attention span and concentration ability.  Cranial nerves: Facial symmetry is present. There is good sensation of the face to pinprick and soft touch on the right, decreased on the left, the patient does not split the midline with vibration sensation on the forehead.. The strength of the facial muscles and the muscles to head turning and shoulder shrug are normal bilaterally. Speech is well enunciated, no aphasia or dysarthria is noted. Extraocular movements are full, with exception of incomplete adduction of the left eye when looking to the right. Visual fields are full. The tongue is midline, and the patient has symmetric elevation of the soft palate. No obvious hearing deficits are noted.  Motor: The motor testing reveals 5 over 5 strength of all 4 extremities, with exception of 4/5 strength with the deltoid muscle on the left, slight weakness with external rotation of the left arm. Good symmetric motor tone is noted throughout.  Sensory: Sensory testing is intact to pinprick, soft touch, vibration  sensation, and position sense on all 4 extremities, with exception of decreased sensation on the left arm and hand. No evidence of extinction is noted.  Coordination: Cerebellar testing reveals good finger-nose-finger and heel-to-shin bilaterally.  Gait and station: Gait is normal. Tandem gait is unsteady. Romberg is negative. No drift is seen.  Reflexes: Deep tendon reflexes are symmetric and normal bilaterally, with exception of a decreased left ankle jerk reflex. Toes are downgoing bilaterally.   CT head and cervical 08/13/18:  IMPRESSION: 1. No acute intracranial hemorrhage. 2. No definite acute/traumatic cervical spine pathology. Evaluation however is limited due to streak artifact caused by metallic fusion hardware. No evidence of hardware fracture.  * CT scan images were reviewed online. I agree with the written report.    Assessment/Plan:  1.  Left arm weakness, C5 and C6 radiculopathy, improving  2.  Episodic tremors, seizures versus pseudoseizures  The patient reports episodes of generalized tremors unassociated with loss of consciousness.  When I indicated that this would be incompatible with a seizure, if all 4 extremities are involved with motor involvement, the patient would be unconscious.  After this explanation, the patient then indicates that he has had several events where he went unconscious.  The patient is on Depakote, he does have therapeutic levels, he will be sent for an EEG study.  He will follow-up in 6 months.  He does not operate a motor vehicle.  Marlan Palau MD 08/14/2018 3:18 PM  [facility] [address] [place], [place] [address]  Phone [phone] Fax [phone]

## 2018-08-26 ENCOUNTER — Ambulatory Visit: Payer: Medicaid Other | Attending: Internal Medicine | Admitting: Pharmacist

## 2018-08-26 DIAGNOSIS — Z86711 Personal history of pulmonary embolism: Secondary | ICD-10-CM | POA: Diagnosis not present

## 2018-08-26 LAB — POCT INR: INR: 2.7 (ref 2.0–3.0)

## 2018-08-27 ENCOUNTER — Telehealth: Payer: Self-pay | Admitting: Neurology

## 2018-08-27 ENCOUNTER — Ambulatory Visit: Payer: Medicaid Other | Admitting: Neurology

## 2018-08-27 DIAGNOSIS — R569 Unspecified convulsions: Secondary | ICD-10-CM | POA: Diagnosis not present

## 2018-08-27 NOTE — Telephone Encounter (Signed)
I called the patient, I discussed the results of the EEG with him, EEG is normal, clinical events described are not fully consistent with seizures.

## 2018-08-27 NOTE — Procedures (Signed)
    History:  Korby Peluso is a 62 year old gentleman with a history of polysubstance abuse who is being evaluated for events of generalized jerking and tremors unassociated with loss of consciousness.  The patient has events and is still able to understand what is going on around him and can talk to people during the event.  The patient is being evaluated for this issue.  This is a routine EEG.  No skull defects are noted.  Medications include allopurinol, calcium supplementation, Depakote, Cymbalta, folic acid, gabapentin, Atarax, Ativan, losartan, meclizine, Robaxin, Reglan, multivitamins, oxycodone, Crestor, Coumadin, and Prilosec  EEG classification: Normal awake and drowsy  Description of the recording: The background rhythms of this recording consists of a fairly well modulated medium amplitude alpha rhythm of 8 Hz that is reactive to eye opening and closure. As the record progresses, the patient appears to remain in the waking state throughout the recording. Photic stimulation was performed, resulting in a bilateral and symmetric photic driving response. Hyperventilation was also performed, resulting in a minimal buildup of the background rhythm activities without significant slowing seen. Toward the end of the recording, the patient enters the drowsy state with slight symmetric slowing seen. The patient never enters stage II sleep. At no time during the recording does there appear to be evidence of spike or spike wave discharges or evidence of focal slowing. EKG monitor shows no evidence of cardiac rhythm abnormalities with a heart rate of 84.  Impression: This is a normal EEG recording in the waking and drowsy state. No evidence of ictal or interictal discharges are seen.

## 2018-09-08 ENCOUNTER — Ambulatory Visit: Payer: Medicaid Other | Attending: Internal Medicine | Admitting: Internal Medicine

## 2018-09-08 ENCOUNTER — Encounter: Payer: Self-pay | Admitting: Internal Medicine

## 2018-09-08 VITALS — BP 189/97 | HR 70 | Temp 98.0°F | Resp 16 | Wt 221.6 lb

## 2018-09-08 DIAGNOSIS — N183 Chronic kidney disease, stage 3 (moderate): Secondary | ICD-10-CM | POA: Diagnosis not present

## 2018-09-08 DIAGNOSIS — M25562 Pain in left knee: Secondary | ICD-10-CM | POA: Insufficient documentation

## 2018-09-08 DIAGNOSIS — I2510 Atherosclerotic heart disease of native coronary artery without angina pectoris: Secondary | ICD-10-CM | POA: Insufficient documentation

## 2018-09-08 DIAGNOSIS — F329 Major depressive disorder, single episode, unspecified: Secondary | ICD-10-CM | POA: Insufficient documentation

## 2018-09-08 DIAGNOSIS — Z7901 Long term (current) use of anticoagulants: Secondary | ICD-10-CM | POA: Diagnosis not present

## 2018-09-08 DIAGNOSIS — F419 Anxiety disorder, unspecified: Secondary | ICD-10-CM | POA: Insufficient documentation

## 2018-09-08 DIAGNOSIS — M109 Gout, unspecified: Secondary | ICD-10-CM | POA: Insufficient documentation

## 2018-09-08 DIAGNOSIS — R358 Other polyuria: Secondary | ICD-10-CM

## 2018-09-08 DIAGNOSIS — Z86711 Personal history of pulmonary embolism: Secondary | ICD-10-CM | POA: Diagnosis not present

## 2018-09-08 DIAGNOSIS — D631 Anemia in chronic kidney disease: Secondary | ICD-10-CM | POA: Insufficient documentation

## 2018-09-08 DIAGNOSIS — G8929 Other chronic pain: Secondary | ICD-10-CM | POA: Diagnosis not present

## 2018-09-08 DIAGNOSIS — Z8249 Family history of ischemic heart disease and other diseases of the circulatory system: Secondary | ICD-10-CM | POA: Insufficient documentation

## 2018-09-08 DIAGNOSIS — I129 Hypertensive chronic kidney disease with stage 1 through stage 4 chronic kidney disease, or unspecified chronic kidney disease: Secondary | ICD-10-CM | POA: Diagnosis present

## 2018-09-08 DIAGNOSIS — K219 Gastro-esophageal reflux disease without esophagitis: Secondary | ICD-10-CM | POA: Diagnosis not present

## 2018-09-08 DIAGNOSIS — Z79899 Other long term (current) drug therapy: Secondary | ICD-10-CM | POA: Diagnosis not present

## 2018-09-08 DIAGNOSIS — E785 Hyperlipidemia, unspecified: Secondary | ICD-10-CM | POA: Diagnosis not present

## 2018-09-08 DIAGNOSIS — N179 Acute kidney failure, unspecified: Secondary | ICD-10-CM | POA: Insufficient documentation

## 2018-09-08 DIAGNOSIS — D750 Familial erythrocytosis: Secondary | ICD-10-CM

## 2018-09-08 DIAGNOSIS — Z86718 Personal history of other venous thrombosis and embolism: Secondary | ICD-10-CM

## 2018-09-08 DIAGNOSIS — E1122 Type 2 diabetes mellitus with diabetic chronic kidney disease: Secondary | ICD-10-CM | POA: Diagnosis not present

## 2018-09-08 DIAGNOSIS — R569 Unspecified convulsions: Secondary | ICD-10-CM | POA: Diagnosis not present

## 2018-09-08 DIAGNOSIS — R3589 Other polyuria: Secondary | ICD-10-CM

## 2018-09-08 DIAGNOSIS — M4722 Other spondylosis with radiculopathy, cervical region: Secondary | ICD-10-CM | POA: Diagnosis not present

## 2018-09-08 DIAGNOSIS — R350 Frequency of micturition: Secondary | ICD-10-CM | POA: Diagnosis not present

## 2018-09-08 LAB — POCT URINALYSIS DIP (CLINITEK)
BILIRUBIN UA: NEGATIVE
GLUCOSE UA: NEGATIVE mg/dL
Leukocytes, UA: NEGATIVE
NITRITE UA: NEGATIVE
PH UA: 6 (ref 5.0–8.0)
POC PROTEIN,UA: 30 — AB
RBC UA: NEGATIVE
SPEC GRAV UA: 1.02 (ref 1.010–1.025)
Urobilinogen, UA: 2 E.U./dL — AB

## 2018-09-08 MED ORDER — TAMSULOSIN HCL 0.4 MG PO CAPS: 0.4000 mg | ORAL_CAPSULE | Freq: Every day | ORAL | 3 refills | Status: DC

## 2018-09-08 MED ORDER — AMLODIPINE BESYLATE 5 MG PO TABS: 5.0000 mg | ORAL_TABLET | Freq: Every day | ORAL | 3 refills | Status: DC

## 2018-09-08 MED FILL — TAMSULOSIN HCL 0.4 MG CAP: 0.4 | 30 days supply | Qty: 30 | Fill #0

## 2018-09-08 NOTE — Patient Instructions (Signed)
Your blood pressure is not controlled.  We have added a low-dose of a blood pressure medication called amlodipine for you to take once a day.

## 2018-09-08 NOTE — Progress Notes (Signed)
Patient ID: Russell Carter, male    DOB: 1955/11/03  MRN: [id number]  CC: Hypertension   Subjective: Russell Carter is a 62 y.o. male who presents for chronic disease management. His concerns today include:  Pt with hx of pre-DM, HTN, HL, CAD,anxiety, gout, dep/anxiety  SZ:  Saw Dr. Anne Hahn since last visit.  There is a question of possible pseudoseizure versus true seizure.  EEG was normal.  Patient continues to take Depakote.  HTN: checking BP daily Range 180s/95-100.  He limit salt in the foods.  He denies any chest pains or shortness of breath.  No lower extremity edema.  He thinks his blood pressure may be elevated because he has had increased pain in the neck which occurs when the weather is cold.  DVT:  Taking Coumadin consistently.  No bruising or bleeding.  Complains of polyuria Q7-12 mins x 1 mth.  Not sleeping well during the night because of frequent urination.  Last chemistry done 07/30/2018 revealed normal sodium level.  He denies any dysuria.  No hesitancy, no post void dribbling.  He feels like he completely empty his bladder each time he urinates  Anxiety/depression.  Followed by behavioral health provider.  Patient Active Problem List   Diagnosis Date Noted  . CKD (chronic kidney disease), stage III (HCC) 05/06/2018  . Seizure (HCC) 05/06/2018  . Normocytic anemia 05/06/2018  . Leukopenia 05/06/2018  . AKI (acute kidney injury) (HCC)   . Acute kidney injury (HCC) 04/27/2018  . Abdominal pain 04/27/2018  . Chronic anticoagulation 04/27/2018  . Left knee pain 01/10/2018  . Numbness and tingling in both hands 07/31/2017  . Gastroesophageal reflux disease without esophagitis 07/31/2017  . Chronic pain disorder 06/05/2017  . History of pulmonary embolus (PE) 02/14/2017  . Diabetes mellitus type 2 in nonobese (HCC)   . Cocaine abuse (HCC)   . Depression   . History of CVA (cerebrovascular accident)   . Cervical spondylosis with radiculopathy 01/24/2017  .  Dyslipidemia 03/15/2016  . Complete heart block (HCC) 03/14/2016  . Cervical spinal stenosis 03/14/2016  . CAD-50% LAD 2011 12/14/2014  . Gout 11/16/2014  . Polysubstance abuse (HCC) 01/15/2013  . DIZZINESS 06/05/2010  . Essential hypertension 05/18/2009     Current Outpatient Medications on File Prior to Visit  Medication Sig Dispense Refill  . acetaminophen (TYLENOL) 325 MG tablet Take 2 tablets (650 mg total) by mouth every 6 (six) hours as needed for mild pain (or Fever >/= 101). (Patient not taking: Reported on 08/14/2018) 30 tablet 1  . acetaminophen (TYLENOL) 500 MG tablet Take 1,000 mg by mouth every 6 (six) hours as needed for headache (pain).    Marland Kitchen allopurinol (ZYLOPRIM) 300 MG tablet TAKE 1 TABLET (300 MG TOTAL) BY MOUTH DAILY. 30 tablet 2  . Calcium Carb-Cholecalciferol (CALCIUM-VITAMIN D) 600-400 MG-UNIT TABS Take 1 tablet by mouth daily. 30 tablet 5  . clotrimazole (LOTRIMIN) 1 % cream Apply 1 application topically 2 (two) times daily.    . divalproex (DEPAKOTE) 500 MG DR tablet Take 1 tablet (500 mg total) by mouth 2 (two) times daily. 60 tablet 5  . DULoxetine (CYMBALTA) 30 MG capsule Take 1 capsule (30 mg total) by mouth 2 (two) times daily. 60 capsule 5  . folic acid (FOLVITE) 1 MG tablet Take 1 tablet (1 mg total) by mouth daily. 30 tablet 5  . gabapentin (NEURONTIN) 400 MG capsule Take 1 capsule (400 mg total) by mouth 2 (two) times daily. (Patient not taking: Reported on  08/14/2018) 180 capsule 3  . gabapentin (NEURONTIN) 600 MG tablet Take 600 mg by mouth 4 (four) times daily.  0  . hydrOXYzine (ATARAX/VISTARIL) 25 MG tablet Take 1 tablet (25 mg total) by mouth every 6 (six) hours as needed. for anxiety 30 tablet 0  . LORazepam (ATIVAN) 0.5 MG tablet Take 1 tablet (0.5 mg total) by mouth every 8 (eight) hours as needed for anxiety. 12 tablet 0  . losartan (COZAAR) 100 MG tablet Take 1 tablet (100 mg total) by mouth daily. 30 tablet 5  . meclizine (ANTIVERT) 25 MG tablet  Take 1 tablet (25 mg total) by mouth daily as needed for dizziness or nausea (vertigo). 15 tablet 0  . methocarbamol (ROBAXIN) 750 MG tablet Take 1 tablet (750 mg total) by mouth every 8 (eight) hours as needed for muscle spasms. 90 tablet 1  . metoCLOPramide (REGLAN) 10 MG tablet TAKE 1 TABLET BY MOUTH EVERY 6 HOURS AS NEEDED FOR NAUSEA OR HEADACHE. (Patient taking differently: Take 10 mg by mouth every 6 (six) hours as needed for nausea (headache). ) 10 tablet 3  . Multiple Vitamin (MULTIVITAMIN WITH MINERALS) TABS tablet Take 1 tablet by mouth daily. 30 tablet 5  . nitroGLYCERIN (NITROSTAT) 0.4 MG SL tablet Place 1 tablet (0.4 mg total) under the tongue every 5 (five) minutes as needed for chest pain. 25 tablet 4  . omeprazole (PRILOSEC) 20 MG capsule Take 1 capsule (20 mg total) by mouth daily for 14 days. 14 capsule 0  . oxyCODONE-acetaminophen (PERCOCET) 7.5-325 MG tablet Take 1 tablet by mouth every 4 (four) hours as needed for severe pain.    . rosuvastatin (CRESTOR) 40 MG tablet Take 1 tablet (40 mg total) by mouth daily. 30 tablet 5  . traZODone (DESYREL) 100 MG tablet Take 1 tablet (100 mg total) by mouth at bedtime as needed for sleep. 30 tablet 5  . warfarin (COUMADIN) 5 MG tablet Take as directed - 1 tab PO Q M/W/F and 1/2 tab all other days. 30 tablet 3   No current facility-administered medications on file prior to visit.     Allergies  Allergen Reactions  . Shellfish Allergy Anaphylaxis    Social History   Socioeconomic History  . Marital status: Widowed    Spouse name: Not on file  . Number of children: Not on file  . Years of education: Not on file  . Highest education level: Not on file  Occupational History  . Not on file  Social Needs  . Financial resource strain: Not on file  . Food insecurity:    Worry: Not on file    Inability: Not on file  . Transportation needs:    Medical: Not on file    Non-medical: Not on file  Tobacco Use  . Smoking status: Never  Smoker  . Smokeless tobacco: Never Used  Substance and Sexual Activity  . Alcohol use: Yes    Comment: 16 oz beer every 2-3 days  . Drug use: Yes    Types: "Crack" cocaine, Marijuana, Cocaine    Comment:  "quit 2008"  . Sexual activity: Not Currently  Lifestyle  . Physical activity:    Days per week: Not on file    Minutes per session: Not on file  . Stress: Not on file  Relationships  . Social connections:    Talks on phone: Not on file    Gets together: Not on file    Attends religious service: Not on file  Active member of club or organization: Not on file    Attends meetings of clubs or organizations: Not on file    Relationship status: Not on file  . Intimate partner violence:    Fear of current or ex partner: Not on file    Emotionally abused: Not on file    Physically abused: Not on file    Forced sexual activity: Not on file  Other Topics Concern  . Not on file  Social History Narrative   Lives in [place] with his wife and son.     Family History  Problem Relation Age of Onset  . Heart attack Mother        x7. Still alive  . Heart attack Father        deceased b/c of MI  . Heart attack Brother        older, deceased b/c of MI   . Cancer Brother        bone cancer in 1 brother, ? brain cancer in other brother  . Heart attack Brother   . Other Brother        MVA  . Other Brother   . Bone cancer Brother   . Cancer Brother   . SIDS Sister     Past Surgical History:  Procedure Laterality Date  . ANTERIOR CERVICAL DECOMP/DISCECTOMY FUSION N/A 01/24/2017   Procedure: Cervical Four-Five,Cervical Five Six,Cervical Six-Seven Anterior cervical discectomy with fusion and plate with Cervical Three-Thoracic- One Laminectomy/Foraminotomy with Cervical Three-Thoracic- One Fixation and fusion;  Surgeon: Loura Halt Ditty, MD;  Location: [facility] OR;  Service: Neurosurgery;  Laterality: N/A;  Part-1 anterior approach  . CARDIAC CATHETERIZATION  2008; ~ 2011    [place]-40%lad  . CARDIAC CATHETERIZATION N/A 03/16/2016   Procedure: Left Heart Cath and Coronary Angiography;  Surgeon: Marykay Lex, MD;  Location: [facility] INVASIVE CV LAB;  Service: Cardiovascular;  Laterality: N/A;  . COLONOSCOPY WITH PROPOFOL N/A 04/17/2018   Procedure: COLONOSCOPY WITH PROPOFOL;  Surgeon: Graylin Shiver, MD;  Location: [facility] ENDOSCOPY;  Service: Endoscopy;  Laterality: N/A;  . CORONARY ANGIOPLASTY     STENTS  . EYE SURGERY Bilateral    "for double vision"  . INGUINAL HERNIA REPAIR  05/12/2012   Procedure: HERNIA REPAIR INGUINAL ADULT;  Surgeon: Liz Malady, MD;  Location: [facility];  Service: General;  Laterality: Right;  . INGUINAL HERNIA REPAIR Left 02/02/2003   Dr. Violeta Gelinas. repair with mesh. Same procedure done again in  05/22/2004   . INGUINAL HERNIA REPAIR Left ?date 2nd OR  . LEFT HEART CATH AND CORONARY ANGIOGRAPHY N/A 04/29/2018   Procedure: LEFT HEART CATH AND CORONARY ANGIOGRAPHY;  Surgeon: Swaz[place], Peter M, MD;  Location: [facility] INVASIVE CV LAB;  Service: Cardiovascular;  Laterality: N/A;  . POSTERIOR CERVICAL FUSION/FORAMINOTOMY N/A 01/24/2017   Procedure: Cervical Three-Thoracic One-Laminectomy/Foraminotomy with Cervical Three-Thoracic One Fixation and fusion;  Surgeon: Loura Halt Ditty, MD;  Location: [facility] OR;  Service: Neurosurgery;  Laterality: N/A;  Part-2 Posterior approach  . RECONSTRUCT / STABILIZE DISTAL ULNA    . SHOULDER OPEN ROTATOR CUFF REPAIR Left 2010  . SHOULDER SURGERY Left    "injured on the job; had to cut small bone out"  . THUMB FUSION Right     ROS: Review of Systems Negative except as above. PHYSICAL EXAM: BP (!) 189/97 (BP Location: Right Arm, Cuff Size: Large) Comment: recheck  Pulse 70   Temp 98 F (36.7 C) (Oral)   Resp 16  Wt 221 lb 9.6 oz (100.5 kg)   SpO2 95%   BMI 30.91 kg/m   Physical Exam  General appearance - alert, well appearing, and in no distress Mental status - normal mood, behavior, speech,  dress, motor activity, and thought processes Neck - supple, no significant adenopathy Chest - clear to auscultation, no wheezes, rales or rhonchi, symmetric air entry Heart - normal rate, regular rhythm, normal S1, S2, no murmurs, rubs, clicks or gallops Rectal -no rectal masses.  Prostate feels a bit enlarged. Extremities -no lower extremity edema.  Depression screen [facility] 2/9 06/06/2018 03/04/2018 12/04/2017  Decreased Interest 0 0 0  Down, Depressed, Hopeless 0 0 1  PHQ - 2 Score 0 0 1  Altered sleeping 0 - 3  Tired, decreased energy 3 - 0  Change in appetite 0 - 0  Feeling bad or failure about yourself  0 - 0  Trouble concentrating 0 - 0  Moving slowly or fidgety/restless 0 - 0  Suicidal thoughts 0 - -  PHQ-9 Score 3 - 4  Difficult doing work/chores - - -  Some recent data might be hidden   Results for orders placed or performed in visit on 09/08/18  POCT URINALYSIS DIP (CLINITEK)  Result Value Ref Range   Color, UA yellow yellow   Clarity, UA clear clear   Glucose, UA negative negative mg/dL   Bilirubin, UA negative negative   Ketones, POC UA trace (5) (A) negative mg/dL   Spec Grav, UA 2.956 2.130 - 1.025   Blood, UA negative negative   pH, UA 6.0 5.0 - 8.0   POC PROTEIN,UA =30 (A) negative, trace   Urobilinogen, UA 2.0 (A) 0.2 or 1.0 E.U./dL   Nitrite, UA Negative Negative   Leukocytes, UA Negative Negative   ASSESSMENT AND PLAN:  1. Essential hypererythropoietinemia Not at goal.  Add amlodipine 5 mg daily.  DASH diet discussed and encourage - amLODipine (NORVASC) 5 MG tablet; Take 1 tablet (5 mg total) by mouth daily.  Dispense: 90 tablet; Refill: 3  2. Polyuria UA is negative.  We will try him with low-dose Flomax at bedtime. - POCT URINALYSIS DIP (CLINITEK) - tamsulosin (FLOMAX) 0.4 MG CAPS capsule; Take 1 capsule (0.4 mg total) by mouth at bedtime.  Dispense: 30 capsule; Refill: 3  3. History of DVT (deep vein thrombosis) Continue Coumadin  4. Cervical  spondylosis with radiculopathy Followed by pain specialist   Patient was given the opportunity to ask questions.  Patient verbalized understanding of the plan and was able to repeat key elements of the plan.   Orders Placed This Encounter  Procedures  . POCT URINALYSIS DIP (CLINITEK)     Requested Prescriptions   Signed Prescriptions Disp Refills  . amLODipine (NORVASC) 5 MG tablet 90 tablet 3    Sig: Take 1 tablet (5 mg total) by mouth daily.  . tamsulosin (FLOMAX) 0.4 MG CAPS capsule 30 capsule 3    Sig: Take 1 capsule (0.4 mg total) by mouth at bedtime.    Return in about 3 months (around 12/09/2018).  Jonah Blue, MD, FACP

## 2018-09-08 NOTE — Progress Notes (Signed)
Pt states he is also having neck pain  

## 2018-09-16 ENCOUNTER — Encounter: Payer: Self-pay | Admitting: Pharmacist

## 2018-09-22 ENCOUNTER — Other Ambulatory Visit: Payer: Self-pay | Admitting: Family Medicine

## 2018-09-22 MED FILL — FOLIC ACID 1 MG TABS: 1 | 30 days supply | Qty: 30 | Fill #3

## 2018-09-22 MED FILL — ROSUVASTATIN CALCIUM 40 MG: 40 | 30 days supply | Qty: 30 | Fill #2

## 2018-09-22 MED FILL — MECLIZINE 25 MG TABLET: 25 | 30 days supply | Qty: 30 | Fill #1

## 2018-09-22 MED FILL — DIVALPROEX SOD DR 500 MG TA: 500 | 30 days supply | Qty: 60 | Fill #3

## 2018-09-22 MED FILL — OMEPRAZOLE 20 MG CAP: 20 | 30 days supply | Qty: 30 | Fill #1

## 2018-09-22 MED FILL — GABAPENTIN 400 MG CAPSULE: 400 | 30 days supply | Qty: 60 | Fill #3

## 2018-09-22 MED FILL — WARFARIN SODIUM 5 MG TABLET: 5 | 30 days supply | Qty: 30 | Fill #2

## 2018-09-22 MED FILL — hydrOXYzine HCL 25 MG TABS: 25 | 8 days supply | Qty: 30 | Fill #0

## 2018-09-22 MED FILL — LOSARTAN POTASSIUM 100 MG T: 100 | 60 days supply | Qty: 60 | Fill #2

## 2018-09-22 MED FILL — METOCLOPRAMIDE 10 MG TABLET: 10 | 3 days supply | Qty: 10 | Fill #2

## 2018-09-23 ENCOUNTER — Ambulatory Visit: Payer: Medicaid Other | Attending: Family Medicine | Admitting: Pharmacist

## 2018-09-23 DIAGNOSIS — Z86711 Personal history of pulmonary embolism: Secondary | ICD-10-CM | POA: Diagnosis not present

## 2018-09-23 DIAGNOSIS — Z7901 Long term (current) use of anticoagulants: Secondary | ICD-10-CM | POA: Diagnosis not present

## 2018-09-23 LAB — POCT INR: INR: 2 (ref 2.0–3.0)

## 2018-09-23 MED FILL — ALLOPURINOL 300 MG TAB: 300 | 30 days supply | Qty: 30 | Fill #0

## 2018-10-16 ENCOUNTER — Encounter (HOSPITAL_COMMUNITY): Payer: Self-pay

## 2018-10-16 ENCOUNTER — Other Ambulatory Visit: Payer: Self-pay

## 2018-10-16 ENCOUNTER — Emergency Department (HOSPITAL_COMMUNITY)
Admission: EM | Admit: 2018-10-16 | Discharge: 2018-10-16 | Disposition: A | Discharge status: Home / Self Care | Payer: Medicaid Other | Attending: Emergency Medicine | Admitting: Emergency Medicine

## 2018-10-16 ENCOUNTER — Emergency Department (HOSPITAL_COMMUNITY): Payer: Medicaid Other

## 2018-10-16 DIAGNOSIS — X500XXA Overexertion from strenuous movement or load, initial encounter: Secondary | ICD-10-CM | POA: Diagnosis not present

## 2018-10-16 DIAGNOSIS — Y9389 Activity, other specified: Secondary | ICD-10-CM | POA: Insufficient documentation

## 2018-10-16 DIAGNOSIS — W108XXA Fall (on) (from) other stairs and steps, initial encounter: Secondary | ICD-10-CM | POA: Insufficient documentation

## 2018-10-16 DIAGNOSIS — M25462 Effusion, left knee: Secondary | ICD-10-CM | POA: Diagnosis not present

## 2018-10-16 DIAGNOSIS — M2392 Unspecified internal derangement of left knee: Secondary | ICD-10-CM

## 2018-10-16 DIAGNOSIS — N183 Chronic kidney disease, stage 3 (moderate): Secondary | ICD-10-CM | POA: Diagnosis not present

## 2018-10-16 DIAGNOSIS — E1122 Type 2 diabetes mellitus with diabetic chronic kidney disease: Secondary | ICD-10-CM | POA: Insufficient documentation

## 2018-10-16 DIAGNOSIS — S8992XA Unspecified injury of left lower leg, initial encounter: Secondary | ICD-10-CM | POA: Diagnosis not present

## 2018-10-16 DIAGNOSIS — Z7901 Long term (current) use of anticoagulants: Secondary | ICD-10-CM | POA: Diagnosis not present

## 2018-10-16 DIAGNOSIS — Y999 Unspecified external cause status: Secondary | ICD-10-CM | POA: Insufficient documentation

## 2018-10-16 DIAGNOSIS — Z79899 Other long term (current) drug therapy: Secondary | ICD-10-CM | POA: Insufficient documentation

## 2018-10-16 DIAGNOSIS — I129 Hypertensive chronic kidney disease with stage 1 through stage 4 chronic kidney disease, or unspecified chronic kidney disease: Secondary | ICD-10-CM | POA: Diagnosis not present

## 2018-10-16 DIAGNOSIS — Y9289 Other specified places as the place of occurrence of the external cause: Secondary | ICD-10-CM | POA: Insufficient documentation

## 2018-10-16 MED ORDER — OXYCODONE-ACETAMINOPHEN 5-325 MG PO TABS
1.0000 | ORAL_TABLET | Freq: Once | ORAL | Status: AC
  Administered 2018-10-16: 1 via ORAL
  Filled 2018-10-16: qty 1

## 2018-10-16 MED ORDER — IBUPROFEN 400 MG PO TABS
400.0000 mg | ORAL_TABLET | Freq: Once | ORAL | Status: AC | PRN
  Administered 2018-10-16: 400 mg via ORAL
  Filled 2018-10-16: qty 1

## 2018-10-16 MED ORDER — OXYCODONE-ACETAMINOPHEN 5-325 MG PO TABS: 2.0000 | ORAL_TABLET | ORAL | 0 refills | Status: DC | PRN

## 2018-10-16 NOTE — ED Provider Notes (Signed)
[name] EMERGENCY DEPARTMENT Provider Note   CSN: [id number] Arrival date & time: 10/16/18  1926     History   Chief Complaint Chief Complaint  Patient presents with  . Knee Injury    HPI Russell Carter is a 62 y.o. male.  The history is provided by the patient and medical records. No language interpreter was used.     62 year old male with significant history of chronic pain disorder, history of kidney disease, polysubstance use, history of blood clot currently on Coumadin usually therapeutic at 2-2.5, presenting complaining of left knee injury.  Patient report he was walking down the steps yesterday, lost control and twisted his left knee.  He felt a pop follows with significant pain to the knee.  He described pain as a sharp throbbing sensation, worsening with movement.  Pain is nonradiating.  He has noticed increasing swelling to that knee.  He report mild left hip pain and no left ankle pain.  He tries taking Advil at home without adequate relief.  He walks with a cane.  He denies any numbness.  He denies any precipitating symptoms prior to the fall.  He has had prior injury to the same knee.  It does not have an orthopedic.  Past Medical History:  Diagnosis Date  . Acute kidney injury (HCC) 04/27/2018  . AKI (acute kidney injury) (HCC)   . Anginal pain (HCC)   . Anxiety   . Arthritis   . Bipolar disorder (HCC)   . Blood in stool   . CAD (coronary artery disease)    a. 2011: cath showing mod non-obstructive disease b. 02/2016: cath with 95% stenosis in the Mid RCA. DES placed.  . Cervical stenosis of spine   . Chronic anticoagulation 04/27/2018  . Chronic pain disorder 06/05/2017  . CKD (chronic kidney disease), stage III (HCC) 05/06/2018  . Cocaine abuse (HCC)    history  . Complete heart block (HCC) 03/14/2016  . Depression   . Diabetes mellitus type 2 in nonobese (HCC)   . Diabetes mellitus without complication (HCC)   . Dyslipidemia 03/15/2016  .  Essential hypertension 05/18/2009   Qualifier: Diagnosis of  By: Kem Parkinson    . Gastroesophageal reflux disease without esophagitis 07/31/2017  . Gout, unspecified   . Headache   . History of CVA (cerebrovascular accident)   . History of pulmonary embolus (PE) 02/14/2017  . HTN (hypertension)   . Hypercholesterolemia   . Hyperglycemia   . Leukopenia 05/06/2018  . Migraines   . Myocardial infarction [facility]) 2008   drug cocaine and marijuana at time of mi none  since    . Normocytic anemia 05/06/2018  . Pneumonia   . Polysubstance abuse (HCC) 01/15/2013  . Rectal bleeding   . Renal insufficiency   . Seizure (HCC) 05/06/2018  . Seizures (HCC)   . Stroke [facility])     Patient Active Problem List   Diagnosis Date Noted  . CKD (chronic kidney disease), stage III (HCC) 05/06/2018  . Seizure (HCC) 05/06/2018  . Normocytic anemia 05/06/2018  . Leukopenia 05/06/2018  . AKI (acute kidney injury) (HCC)   . Acute kidney injury (HCC) 04/27/2018  . Abdominal pain 04/27/2018  . Chronic anticoagulation 04/27/2018  . Left knee pain 01/10/2018  . Numbness and tingling in both hands 07/31/2017  . Gastroesophageal reflux disease without esophagitis 07/31/2017  . Chronic pain disorder 06/05/2017  . History of pulmonary embolus (PE) 02/14/2017  . Diabetes mellitus type 2 in nonobese (HCC)   .  Cocaine abuse (HCC)   . Depression   . History of CVA (cerebrovascular accident)   . Cervical spondylosis with radiculopathy 01/24/2017  . Dyslipidemia 03/15/2016  . Complete heart block (HCC) 03/14/2016  . Cervical spinal stenosis 03/14/2016  . CAD-50% LAD 2011 12/14/2014  . Gout 11/16/2014  . Polysubstance abuse (HCC) 01/15/2013  . DIZZINESS 06/05/2010  . Essential hypertension 05/18/2009    Past Surgical History:  Procedure Laterality Date  . ANTERIOR CERVICAL DECOMP/DISCECTOMY FUSION N/A 01/24/2017   Procedure: Cervical Four-Five,Cervical Five Six,Cervical Six-Seven Anterior cervical discectomy  with fusion and plate with Cervical Three-Thoracic- One Laminectomy/Foraminotomy with Cervical Three-Thoracic- One Fixation and fusion;  Surgeon: Loura Halt Ditty, MD;  Location: [facility] OR;  Service: Neurosurgery;  Laterality: N/A;  Part-1 anterior approach  . CARDIAC CATHETERIZATION  2008; ~ 2011   [place]-40%lad  . CARDIAC CATHETERIZATION N/A 03/16/2016   Procedure: Left Heart Cath and Coronary Angiography;  Surgeon: Marykay Lex, MD;  Location: [facility] INVASIVE CV LAB;  Service: Cardiovascular;  Laterality: N/A;  . COLONOSCOPY WITH PROPOFOL N/A 04/17/2018   Procedure: COLONOSCOPY WITH PROPOFOL;  Surgeon: Graylin Shiver, MD;  Location: [facility] ENDOSCOPY;  Service: Endoscopy;  Laterality: N/A;  . CORONARY ANGIOPLASTY     STENTS  . EYE SURGERY Bilateral    "for double vision"  . INGUINAL HERNIA REPAIR  05/12/2012   Procedure: HERNIA REPAIR INGUINAL ADULT;  Surgeon: Liz Malady, MD;  Location: [facility];  Service: General;  Laterality: Right;  . INGUINAL HERNIA REPAIR Left 02/02/2003   Dr. Violeta Gelinas. repair with mesh. Same procedure done again in  05/22/2004   . INGUINAL HERNIA REPAIR Left ?date 2nd OR  . LEFT HEART CATH AND CORONARY ANGIOGRAPHY N/A 04/29/2018   Procedure: LEFT HEART CATH AND CORONARY ANGIOGRAPHY;  Surgeon: [place], Peter M, MD;  Location: [facility] INVASIVE CV LAB;  Service: Cardiovascular;  Laterality: N/A;  . POSTERIOR CERVICAL FUSION/FORAMINOTOMY N/A 01/24/2017   Procedure: Cervical Three-Thoracic One-Laminectomy/Foraminotomy with Cervical Three-Thoracic One Fixation and fusion;  Surgeon: Loura Halt Ditty, MD;  Location: [facility] OR;  Service: Neurosurgery;  Laterality: N/A;  Part-2 Posterior approach  . RECONSTRUCT / STABILIZE DISTAL ULNA    . SHOULDER OPEN ROTATOR CUFF REPAIR Left 2010  . SHOULDER SURGERY Left    "injured on the job; had to cut small bone out"  . THUMB FUSION Right         Home Medications    Prior to Admission medications   Medication Sig Start  Date End Date Taking? Authorizing Provider  acetaminophen (TYLENOL) 325 MG tablet Take 2 tablets (650 mg total) by mouth every 6 (six) hours as needed for mild pain (or Fever >/= 101). Patient not taking: Reported on 08/14/2018 05/07/18   Shon Hale, MD  acetaminophen (TYLENOL) 500 MG tablet Take 1,000 mg by mouth every 6 (six) hours as needed for headache (pain).    [provider]  allopurinol (ZYLOPRIM) 300 MG tablet TAKE 1 TABLET (300 MG TOTAL) BY MOUTH DAILY. 09/22/18   Marcine Matar, MD  amLODipine (NORVASC) 5 MG tablet Take 1 tablet (5 mg total) by mouth daily. 09/08/18   Marcine Matar, MD  Calcium Carb-Cholecalciferol (CALCIUM-VITAMIN D) 600-400 MG-UNIT TABS Take 1 tablet by mouth daily. 05/07/18   Shon Hale, MD  clotrimazole (LOTRIMIN) 1 % cream Apply 1 application topically 2 (two) times daily.    [provider]  divalproex (DEPAKOTE) 500 MG DR tablet Take 1 tablet (500 mg total) by mouth 2 (  two) times daily. 05/07/18   Shon HaleEmokpae, Courage, MD  DULoxetine (CYMBALTA) 30 MG capsule Take 1 capsule (30 mg total) by mouth 2 (two) times daily. 05/07/18   Shon HaleEmokpae, Courage, MD  folic acid (FOLVITE) 1 MG tablet Take 1 tablet (1 mg total) by mouth daily. 05/07/18   Shon HaleEmokpae, Courage, MD  gabapentin (NEURONTIN) 400 MG capsule Take 1 capsule (400 mg total) by mouth 2 (two) times daily. Patient not taking: Reported on 08/14/2018 05/07/18   Shon HaleEmokpae, Courage, MD  gabapentin (NEURONTIN) 600 MG tablet Take 600 mg by mouth 4 (four) times daily. 07/24/18   [provider]  hydrOXYzine (ATARAX/VISTARIL) 25 MG tablet Take 1 tablet (25 mg total) by mouth every 6 (six) hours as needed. for anxiety 08/07/18   [name], [name], MD  LORazepam (ATIVAN) 0.5 MG tablet Take 1 tablet (0.5 mg total) by mouth every 8 (eight) hours as needed for anxiety. 05/07/18 05/07/19  Shon HaleEmokpae, Courage, MD  losartan (COZAAR) 100 MG tablet Take 1 tablet (100 mg total) by mouth daily. 05/07/18    Shon HaleEmokpae, Courage, MD  meclizine (ANTIVERT) 25 MG tablet Take 1 tablet (25 mg total) by mouth daily as needed for dizziness or nausea (vertigo). 06/13/18   [name], [name], MD  methocarbamol (ROBAXIN) 750 MG tablet Take 1 tablet (750 mg total) by mouth every 8 (eight) hours as needed for muscle spasms. 05/07/18   Shon HaleEmokpae, Courage, MD  metoCLOPramide (REGLAN) 10 MG tablet TAKE 1 TABLET BY MOUTH EVERY 6 HOURS AS NEEDED FOR NAUSEA OR HEADACHE. Patient taking differently: Take 10 mg by mouth every 6 (six) hours as needed for nausea (headache).  06/13/18   [name], [name], MD  Multiple Vitamin (MULTIVITAMIN WITH MINERALS) TABS tablet Take 1 tablet by mouth daily. 05/07/18   Shon HaleEmokpae, Courage, MD  nitroGLYCERIN (NITROSTAT) 0.4 MG SL tablet Place 1 tablet (0.4 mg total) under the tongue every 5 (five) minutes as needed for chest pain. 05/02/17   Quentin AngstJegede, Olugbemiga E, MD  omeprazole (PRILOSEC) 20 MG capsule Take 1 capsule (20 mg total) by mouth daily for 14 days. 07/30/18 08/13/18  Linwood DibblesKnapp, Jon, MD  oxyCODONE-acetaminophen (PERCOCET) 7.5-325 MG tablet Take 1 tablet by mouth every 4 (four) hours as needed for severe pain.    [provider]  rosuvastatin (CRESTOR) 40 MG tablet Take 1 tablet (40 mg total) by mouth daily. 05/07/18   Shon HaleEmokpae, Courage, MD  tamsulosin (FLOMAX) 0.4 MG CAPS capsule Take 1 capsule (0.4 mg total) by mouth at bedtime. 09/08/18   [name], [name], MD  traZODone (DESYREL) 100 MG tablet Take 1 tablet (100 mg total) by mouth at bedtime as needed for sleep. 05/07/18   Shon HaleEmokpae, Courage, MD  warfarin (COUMADIN) 5 MG tablet Take as directed - 1 tab PO Q M/W/F and 1/2 tab all other days. 06/13/18   [name], [name], MD    Family History Family History  Problem Relation Age of Onset  . Heart attack Mother        x7. Still alive  . Heart attack Father        deceased b/c of MI  . Heart attack Brother        older, deceased b/c of MI   . Cancer Brother        bone cancer in 1  brother, ? brain cancer in other brother  . Heart attack Brother   . Other Brother        MVA  . Other Brother   . Bone  cancer Brother   . Cancer Brother   . SIDS Sister     Social History Social History   Tobacco Use  . Smoking status: Never Smoker  . Smokeless tobacco: Never Used  Substance Use Topics  . Alcohol use: Yes    Comment: 16 oz beer every 2-3 days  . Drug use: Yes    Types: "Crack" cocaine, Marijuana, Cocaine    Comment:  "quit 2008"     Allergies   Shellfish allergy   Review of Systems Review of Systems  Constitutional: Negative for fever.  Musculoskeletal: Positive for joint swelling.  Neurological: Negative for numbness.     Physical Exam Updated Vital Signs BP (!) 176/109 (BP Location: Left Arm)   Pulse 92   Temp 98 F (36.7 C) (Oral)   Resp 18   SpO2 98%   Physical Exam Vitals signs and nursing note reviewed.  Constitutional:      General: He is not in acute distress.    Appearance: He is well-developed.  HENT:     Head: Atraumatic.  Eyes:     Conjunctiva/sclera: Conjunctivae normal.  Neck:     Musculoskeletal: Neck supple.  Musculoskeletal:        General: Swelling (Left knee: Moderate edema noted to the anterior knee consistent with a joint effusion.  Decreased flexion extension secondary to pain.  No obvious joint laxity appreciated.  No obvious deformity.) present.     Comments: No tenderness to left hip or left ankle.  Skin:    Findings: No rash.  Neurological:     Mental Status: He is alert.      ED Treatments / Results  Labs (all labs ordered are listed, but only abnormal results are displayed) Labs Reviewed - No data to display  EKG None  Radiology Dg Knee Complete 4 Views Left  Result Date: 10/16/2018 CLINICAL DATA:  Status post injury to the left knee. Heard a loud pop. Cannot put weight on the left leg. Left knee swelling. Initial encounter. EXAM: LEFT KNEE - COMPLETE 4+ VIEW COMPARISON:  Left knee  radiographs performed 04/04/2017 FINDINGS: No definite acute fracture is seen. Minimal cortical irregularity along the lateral aspect of the proximal tibia may reflect positioning. A displaced superior fragment is again noted with respect to the bipartite patella. Visualized joint spaces are grossly preserved. There is mild cortical irregularity at the lateral compartment. A large knee joint effusion is noted, new from the prior study. IMPRESSION: 1. No definite acute fracture seen. 2. Large knee joint effusion, new from the prior study. This raises concern for underlying internal derangement. MRI of the left knee would be helpful for further evaluation, as deemed clinically appropriate. Electronically Signed   By: Roanna Raider M.D.   On: 10/16/2018 21:52    Procedures Procedures (including critical care time)  Medications Ordered in ED Medications  oxyCODONE-acetaminophen (PERCOCET/ROXICET) 5-325 MG per tablet 1 tablet (has no administration in time range)  ibuprofen (ADVIL,MOTRIN) tablet 400 mg (400 mg Oral Given 10/16/18 2045)     Initial Impression / Assessment and Plan / ED Course  I have reviewed the triage vital signs and the nursing notes.  Pertinent labs & imaging results that were available during my care of the patient were reviewed by me and considered in my medical decision making (see chart for details).     BP (!) 176/109 (BP Location: Left Arm)   Pulse 92   Temp 98 F (36.7 C) (Oral)   Resp 18  SpO2 98%    Final Clinical Impressions(s) / ED Diagnoses   Final diagnoses:  Acute internal derangement of left knee    ED Discharge Orders         Ordered    oxyCODONE-acetaminophen (PERCOCET) 5-325 MG tablet  Every 4 hours PRN     10/16/18 2310         10:10 PM Patient here with mechanical injury involving his left knee.  X-ray of the left knee demonstrate no definitive acute fracture seen however there is a large knee joint effusion which is new from prior  study.  This raises concern for underlying internal derangement.  MRI of the left knee will be helpful for further evaluation.  We will place knee in a knee mild immobilizer.  Rice therapy discussed.  Crutches provided for support.  Will provide opiate pain medication as anti-inflammatory along doesn't provide adequate relief and patient is on anticoagulant.  Suspect joint effusion likely secondary to hemarthrosis.  Encourage patient to follow-up with orthopedist promptly.  His injury happened yesterday.     Fayrene Helper, PA-C 10/16/18 2313    Tilden Fossa, MD 10/18/18 [phone]

## 2018-10-16 NOTE — ED Triage Notes (Addendum)
Pt here after slipping and twisting left knee the wrong direction.  A&Ox4 No other pain

## 2018-10-16 NOTE — Discharge Instructions (Signed)
You are likely having a ligament injury in your left knee.  Wear knee immobilizer as protection.  Keep your leg elevated at rest.  Take pain medication as needed.  Call and follow up closely with orthopedist promptly for further care.  You will likely need an MRI of your left knee for further evaluation.  Return if you have any concerns. Use crutches as needed.

## 2018-10-28 ENCOUNTER — Encounter: Payer: Self-pay | Admitting: Pharmacist

## 2018-11-03 MED FILL — ROSUVASTATIN CALCIUM 40 MG: 40 | 30 days supply | Qty: 30 | Fill #3

## 2018-11-03 MED FILL — METOCLOPRAMIDE 10 MG TABLET: 10 | 3 days supply | Qty: 10 | Fill #3

## 2018-11-03 MED FILL — GABAPENTIN 400 MG CAPSULE: 400 | 30 days supply | Qty: 60 | Fill #4

## 2018-11-03 MED FILL — DIVALPROEX SOD DR 500 MG TA: 500 | 30 days supply | Qty: 60 | Fill #4

## 2018-11-03 MED FILL — ALLOPURINOL 300 MG TAB: 300 | 30 days supply | Qty: 30 | Fill #1

## 2018-11-03 MED FILL — TAMSULOSIN HCL 0.4 MG CAP: 0.4 | 30 days supply | Qty: 30 | Fill #1

## 2018-11-03 MED FILL — WARFARIN SODIUM 5 MG TABLET: 5 | 30 days supply | Qty: 30 | Fill #3

## 2018-11-03 MED FILL — hydrOXYzine HCL 25 MG TABS: 25 | 8 days supply | Qty: 30 | Fill #1

## 2018-11-03 MED FILL — OMEPRAZOLE 20 MG CAP: 20 | 30 days supply | Qty: 30 | Fill #2

## 2018-11-03 MED FILL — MECLIZINE 25 MG TABLET: 25 | 30 days supply | Qty: 30 | Fill #2

## 2018-11-04 ENCOUNTER — Encounter: Payer: Self-pay | Admitting: Pharmacist

## 2018-11-04 MED FILL — DULoxetine HCL 30 MG CPEP: 30 | 60 days supply | Qty: 120 | Fill #2

## 2018-11-05 ENCOUNTER — Ambulatory Visit: Payer: Medicaid Other | Attending: Family Medicine | Admitting: Pharmacist

## 2018-11-05 DIAGNOSIS — Z86711 Personal history of pulmonary embolism: Secondary | ICD-10-CM | POA: Insufficient documentation

## 2018-11-05 LAB — POCT INR: INR: 2.6 (ref 2.0–3.0)

## 2018-12-09 ENCOUNTER — Ambulatory Visit: Payer: Self-pay | Admitting: Internal Medicine

## 2018-12-18 ENCOUNTER — Other Ambulatory Visit: Payer: Self-pay | Admitting: Pharmacist

## 2018-12-18 DIAGNOSIS — D750 Familial erythrocytosis: Secondary | ICD-10-CM

## 2018-12-18 MED ORDER — LOSARTAN POTASSIUM 100 MG PO TABS: 100.0000 mg | ORAL_TABLET | Freq: Every day | ORAL | 2 refills | Status: DC

## 2018-12-18 MED ORDER — WARFARIN SODIUM 5 MG PO TABS: ORAL_TABLET | ORAL | 3 refills | Status: DC

## 2018-12-18 MED ORDER — AMLODIPINE BESYLATE 5 MG PO TABS: 5.0000 mg | ORAL_TABLET | Freq: Every day | ORAL | 2 refills | Status: AC

## 2018-12-24 ENCOUNTER — Emergency Department (HOSPITAL_COMMUNITY): Payer: Medicaid Other

## 2018-12-24 ENCOUNTER — Inpatient Hospital Stay (HOSPITAL_COMMUNITY)
Admission: EM | Admit: 2018-12-24 | Discharge: 2018-12-26 | DRG: 062 | Disposition: A | Discharge status: Home Health | Payer: Medicaid Other | Attending: Neurology | Admitting: Neurology

## 2018-12-24 ENCOUNTER — Other Ambulatory Visit: Payer: Self-pay

## 2018-12-24 ENCOUNTER — Inpatient Hospital Stay (HOSPITAL_COMMUNITY): Payer: Medicaid Other

## 2018-12-24 DIAGNOSIS — F419 Anxiety disorder, unspecified: Secondary | ICD-10-CM | POA: Diagnosis present

## 2018-12-24 DIAGNOSIS — H5347 Heteronymous bilateral field defects: Secondary | ICD-10-CM | POA: Diagnosis present

## 2018-12-24 DIAGNOSIS — R251 Tremor, unspecified: Secondary | ICD-10-CM | POA: Diagnosis present

## 2018-12-24 DIAGNOSIS — G43909 Migraine, unspecified, not intractable, without status migrainosus: Secondary | ICD-10-CM | POA: Diagnosis present

## 2018-12-24 DIAGNOSIS — E785 Hyperlipidemia, unspecified: Secondary | ICD-10-CM | POA: Diagnosis present

## 2018-12-24 DIAGNOSIS — I1310 Hypertensive heart and chronic kidney disease without heart failure, with stage 1 through stage 4 chronic kidney disease, or unspecified chronic kidney disease: Secondary | ICD-10-CM | POA: Diagnosis present

## 2018-12-24 DIAGNOSIS — R791 Abnormal coagulation profile: Secondary | ICD-10-CM | POA: Diagnosis present

## 2018-12-24 DIAGNOSIS — I639 Cerebral infarction, unspecified: Secondary | ICD-10-CM | POA: Diagnosis not present

## 2018-12-24 DIAGNOSIS — M542 Cervicalgia: Secondary | ICD-10-CM | POA: Diagnosis present

## 2018-12-24 DIAGNOSIS — E872 Acidosis: Secondary | ICD-10-CM | POA: Diagnosis present

## 2018-12-24 DIAGNOSIS — E1122 Type 2 diabetes mellitus with diabetic chronic kidney disease: Secondary | ICD-10-CM | POA: Diagnosis present

## 2018-12-24 DIAGNOSIS — E669 Obesity, unspecified: Secondary | ICD-10-CM | POA: Diagnosis present

## 2018-12-24 DIAGNOSIS — F1210 Cannabis abuse, uncomplicated: Secondary | ICD-10-CM | POA: Diagnosis present

## 2018-12-24 DIAGNOSIS — Z955 Presence of coronary angioplasty implant and graft: Secondary | ICD-10-CM

## 2018-12-24 DIAGNOSIS — I252 Old myocardial infarction: Secondary | ICD-10-CM

## 2018-12-24 DIAGNOSIS — N183 Chronic kidney disease, stage 3 (moderate): Secondary | ICD-10-CM | POA: Diagnosis present

## 2018-12-24 DIAGNOSIS — G894 Chronic pain syndrome: Secondary | ICD-10-CM | POA: Diagnosis present

## 2018-12-24 DIAGNOSIS — K219 Gastro-esophageal reflux disease without esophagitis: Secondary | ICD-10-CM | POA: Diagnosis present

## 2018-12-24 DIAGNOSIS — Z86711 Personal history of pulmonary embolism: Secondary | ICD-10-CM

## 2018-12-24 DIAGNOSIS — I6300 Cerebral infarction due to thrombosis of unspecified precerebral artery: Secondary | ICD-10-CM | POA: Diagnosis not present

## 2018-12-24 DIAGNOSIS — Z8249 Family history of ischemic heart disease and other diseases of the circulatory system: Secondary | ICD-10-CM

## 2018-12-24 DIAGNOSIS — F319 Bipolar disorder, unspecified: Secondary | ICD-10-CM | POA: Diagnosis present

## 2018-12-24 DIAGNOSIS — M109 Gout, unspecified: Secondary | ICD-10-CM | POA: Diagnosis present

## 2018-12-24 DIAGNOSIS — Z8673 Personal history of transient ischemic attack (TIA), and cerebral infarction without residual deficits: Secondary | ICD-10-CM | POA: Diagnosis present

## 2018-12-24 DIAGNOSIS — I2510 Atherosclerotic heart disease of native coronary artery without angina pectoris: Secondary | ICD-10-CM | POA: Diagnosis present

## 2018-12-24 DIAGNOSIS — I351 Nonrheumatic aortic (valve) insufficiency: Secondary | ICD-10-CM | POA: Diagnosis not present

## 2018-12-24 DIAGNOSIS — Z6830 Body mass index (BMI) 30.0-30.9, adult: Secondary | ICD-10-CM | POA: Diagnosis not present

## 2018-12-24 DIAGNOSIS — Z7901 Long term (current) use of anticoagulants: Secondary | ICD-10-CM

## 2018-12-24 DIAGNOSIS — Z981 Arthrodesis status: Secondary | ICD-10-CM

## 2018-12-24 DIAGNOSIS — F1410 Cocaine abuse, uncomplicated: Secondary | ICD-10-CM | POA: Diagnosis present

## 2018-12-24 DIAGNOSIS — T148XXA Other injury of unspecified body region, initial encounter: Secondary | ICD-10-CM

## 2018-12-24 DIAGNOSIS — G8194 Hemiplegia, unspecified affecting left nondominant side: Secondary | ICD-10-CM | POA: Diagnosis present

## 2018-12-24 DIAGNOSIS — G459 Transient cerebral ischemic attack, unspecified: Principal | ICD-10-CM | POA: Diagnosis present

## 2018-12-24 DIAGNOSIS — I480 Paroxysmal atrial fibrillation: Secondary | ICD-10-CM | POA: Diagnosis present

## 2018-12-24 DIAGNOSIS — R531 Weakness: Secondary | ICD-10-CM | POA: Diagnosis present

## 2018-12-24 DIAGNOSIS — Z79899 Other long term (current) drug therapy: Secondary | ICD-10-CM

## 2018-12-24 DIAGNOSIS — Z79891 Long term (current) use of opiate analgesic: Secondary | ICD-10-CM

## 2018-12-24 LAB — CBC
HCT: 47.3 % (ref 39.0–52.0)
Hemoglobin: 15.4 g/dL (ref 13.0–17.0)
MCH: 28.7 pg (ref 26.0–34.0)
MCHC: 32.6 g/dL (ref 30.0–36.0)
MCV: 88.1 fL (ref 80.0–100.0)
Platelets: 242 K/uL (ref 150–400)
RBC: 5.37 MIL/uL (ref 4.22–5.81)
RDW: 14.1 % (ref 11.5–15.5)
WBC: 5.1 K/uL (ref 4.0–10.5)
nRBC: 0 % (ref 0.0–0.2)

## 2018-12-24 LAB — DIFFERENTIAL
Abs Immature Granulocytes: 0.01 K/uL (ref 0.00–0.07)
Basophils Absolute: 0 K/uL (ref 0.0–0.1)
Basophils Relative: 0 %
Eosinophils Absolute: 0 K/uL (ref 0.0–0.5)
Eosinophils Relative: 1 %
Immature Granulocytes: 0 %
Lymphocytes Relative: 34 %
Lymphs Abs: 1.8 K/uL (ref 0.7–4.0)
Monocytes Absolute: 0.5 K/uL (ref 0.1–1.0)
Monocytes Relative: 10 %
Neutro Abs: 2.8 K/uL (ref 1.7–7.7)
Neutrophils Relative %: 55 %

## 2018-12-24 LAB — COMPREHENSIVE METABOLIC PANEL
ALT: 36 U/L (ref 0–44)
AST: 55 U/L — ABNORMAL HIGH (ref 15–41)
Albumin: 3.6 g/dL (ref 3.5–5.0)
Alkaline Phosphatase: 51 U/L (ref 38–126)
Anion gap: 10 (ref 5–15)
BUN: 11 mg/dL (ref 8–23)
CO2: 17 mmol/L — ABNORMAL LOW (ref 22–32)
Calcium: 8.9 mg/dL (ref 8.9–10.3)
Chloride: 110 mmol/L (ref 98–111)
Creatinine, Ser: 1.11 mg/dL (ref 0.61–1.24)
GFR calc Af Amer: >60 mL/min (ref >60)
GFR calc non Af Amer: >60 mL/min (ref >60)
Glucose, Bld: 112 mg/dL — ABNORMAL HIGH (ref 70–99)
Potassium: 4.5 mmol/L (ref 3.5–5.1)
Sodium: 137 mmol/L (ref 135–145)
Total Bilirubin: 1.6 mg/dL — ABNORMAL HIGH (ref 0.3–1.2)
Total Protein: 6.3 g/dL — ABNORMAL LOW (ref 6.5–8.1)

## 2018-12-24 LAB — PROTIME-INR
INR: 1.4 — ABNORMAL HIGH (ref 0.8–1.2)
Prothrombin Time: 16.7 s — ABNORMAL HIGH (ref 11.4–15.2)

## 2018-12-24 LAB — I-STAT CREATININE, ED: Creatinine, Ser: 1.4 mg/dL — ABNORMAL HIGH (ref 0.61–1.24)

## 2018-12-24 LAB — APTT: aPTT: 30 seconds (ref 24–36)

## 2018-12-24 LAB — POC OCCULT BLOOD, ED: Fecal Occult Bld: NEGATIVE

## 2018-12-24 MED ORDER — ALTEPLASE (STROKE) FULL DOSE INFUSION
0.9000 mg/kg | Freq: Once | INTRAVENOUS | Status: AC
  Administered 2018-12-24: 88.4 mg via INTRAVENOUS
  Filled 2018-12-24: qty 100

## 2018-12-24 MED ORDER — ACETAMINOPHEN 160 MG/5ML PO SOLN: 650.0000 mg | ORAL | Status: DC | PRN

## 2018-12-24 MED ORDER — NICARDIPINE HCL IN NACL 20-0.86 MG/200ML-% IV SOLN: 0.0000 mg/h | INTRAVENOUS | Status: DC | PRN

## 2018-12-24 MED ORDER — ACETAMINOPHEN 325 MG PO TABS: 650.0000 mg | ORAL_TABLET | ORAL | Status: DC | PRN

## 2018-12-24 MED ORDER — SODIUM CHLORIDE 0.9 % IV SOLN
50.0000 mL/h | INTRAVENOUS | Status: DC
  Administered 2018-12-25 – 2018-12-26 (×2): 50 mL/h via INTRAVENOUS

## 2018-12-24 MED ORDER — STROKE: EARLY STAGES OF RECOVERY BOOK: Freq: Once | Status: DC

## 2018-12-24 MED ORDER — PANTOPRAZOLE SODIUM 40 MG IV SOLR
40.0000 mg | Freq: Every day | INTRAVENOUS | Status: DC
  Administered 2018-12-25: 40 mg via INTRAVENOUS
  Filled 2018-12-24: qty 40

## 2018-12-24 MED ORDER — SODIUM CHLORIDE 0.9% FLUSH: 3.0000 mL | Freq: Once | INTRAVENOUS | Status: DC

## 2018-12-24 MED ORDER — ACETAMINOPHEN 650 MG RE SUPP: 650.0000 mg | RECTAL | Status: DC | PRN

## 2018-12-24 MED ORDER — IOHEXOL 350 MG/ML SOLN
100.0000 mL | Freq: Once | INTRAVENOUS | Status: AC | PRN
  Administered 2018-12-24: 100 mL via INTRAVENOUS

## 2018-12-24 MED ORDER — IOPAMIDOL (ISOVUE-370) INJECTION 76%: 50.0000 mL | Freq: Once | INTRAVENOUS | Status: DC | PRN

## 2018-12-24 MED ORDER — LABETALOL HCL 5 MG/ML IV SOLN: 10.0000 mg | Freq: Once | INTRAVENOUS | Status: DC | PRN

## 2018-12-24 NOTE — ED Provider Notes (Signed)
[facility] EMERGENCY DEPARTMENT Provider Note   CSN: [id number] Arrival date & time: 12/24/18  2145    History   Chief Complaint No chief complaint on file.   HPI Russell Carter is a 63 y.o. male.     Patient is a 63 year old male with past medical history of chronic renal insufficiency, complete heart block, diabetes, and prior CVA.  He is currently anticoagulated with Coumadin.  He was brought by EMS today after developing weakness in his left side, then collapsing to the floor.  He has been less responsive since.  Patient unable to add additional history as he appears a phasic.  The history is provided by the patient.    Past Medical History:  Diagnosis Date  . Acute kidney injury (HCC) 04/27/2018  . AKI (acute kidney injury) (HCC)   . Anginal pain (HCC)   . Anxiety   . Arthritis   . Bipolar disorder (HCC)   . Blood in stool   . CAD (coronary artery disease)    a. 2011: cath showing mod non-obstructive disease b. 02/2016: cath with 95% stenosis in the Mid RCA. DES placed.  . Cervical stenosis of spine   . Chronic anticoagulation 04/27/2018  . Chronic pain disorder 06/05/2017  . CKD (chronic kidney disease), stage III (HCC) 05/06/2018  . Cocaine abuse (HCC)    history  . Complete heart block (HCC) 03/14/2016  . Depression   . Diabetes mellitus type 2 in nonobese (HCC)   . Diabetes mellitus without complication (HCC)   . Dyslipidemia 03/15/2016  . Essential hypertension 05/18/2009   Qualifier: Diagnosis of  By: Kem Parkinson    . Gastroesophageal reflux disease without esophagitis 07/31/2017  . Gout, unspecified   . Headache   . History of CVA (cerebrovascular accident)   . History of pulmonary embolus (PE) 02/14/2017  . HTN (hypertension)   . Hypercholesterolemia   . Hyperglycemia   . Leukopenia 05/06/2018  . Migraines   . Myocardial infarction [facility]) 2008   drug cocaine and marijuana at time of mi none  since    . Normocytic anemia 05/06/2018  .  Pneumonia   . Polysubstance abuse (HCC) 01/15/2013  . Rectal bleeding   . Renal insufficiency   . Seizure (HCC) 05/06/2018  . Seizures (HCC)   . Stroke [facility])     Patient Active Problem List   Diagnosis Date Noted  . CKD (chronic kidney disease), stage III (HCC) 05/06/2018  . Seizure (HCC) 05/06/2018  . Normocytic anemia 05/06/2018  . Leukopenia 05/06/2018  . AKI (acute kidney injury) (HCC)   . Acute kidney injury (HCC) 04/27/2018  . Abdominal pain 04/27/2018  . Chronic anticoagulation 04/27/2018  . Left knee pain 01/10/2018  . Numbness and tingling in both hands 07/31/2017  . Gastroesophageal reflux disease without esophagitis 07/31/2017  . Chronic pain disorder 06/05/2017  . History of pulmonary embolus (PE) 02/14/2017  . Diabetes mellitus type 2 in nonobese (HCC)   . Cocaine abuse (HCC)   . Depression   . History of CVA (cerebrovascular accident)   . Cervical spondylosis with radiculopathy 01/24/2017  . Dyslipidemia 03/15/2016  . Complete heart block (HCC) 03/14/2016  . Cervical spinal stenosis 03/14/2016  . CAD-50% LAD 2011 12/14/2014  . Gout 11/16/2014  . Polysubstance abuse (HCC) 01/15/2013  . DIZZINESS 06/05/2010  . Essential hypertension 05/18/2009    Past Surgical History:  Procedure Laterality Date  . ANTERIOR CERVICAL DECOMP/DISCECTOMY FUSION N/A 01/24/2017   Procedure: Cervical Four-Five,Cervical Five Six,Cervical Six-Seven  Anterior cervical discectomy with fusion and plate with Cervical Three-Thoracic- One Laminectomy/Foraminotomy with Cervical Three-Thoracic- One Fixation and fusion;  Surgeon: Loura Halt Ditty, MD;  Location: [facility] OR;  Service: Neurosurgery;  Laterality: N/A;  Part-1 anterior approach  . CARDIAC CATHETERIZATION  2008; ~ 2011   [place]-40%lad  . CARDIAC CATHETERIZATION N/A 03/16/2016   Procedure: Left Heart Cath and Coronary Angiography;  Surgeon: Marykay Lex, MD;  Location: [facility] INVASIVE CV LAB;  Service: Cardiovascular;  Laterality: N/A;  .  COLONOSCOPY WITH PROPOFOL N/A 04/17/2018   Procedure: COLONOSCOPY WITH PROPOFOL;  Surgeon: Graylin Shiver, MD;  Location: [facility] ENDOSCOPY;  Service: Endoscopy;  Laterality: N/A;  . CORONARY ANGIOPLASTY     STENTS  . EYE SURGERY Bilateral    "for double vision"  . INGUINAL HERNIA REPAIR  05/12/2012   Procedure: HERNIA REPAIR INGUINAL ADULT;  Surgeon: Liz Malady, MD;  Location: [facility];  Service: General;  Laterality: Right;  . INGUINAL HERNIA REPAIR Left 02/02/2003   Dr. Violeta Gelinas. repair with mesh. Same procedure done again in  05/22/2004   . INGUINAL HERNIA REPAIR Left ?date 2nd OR  . LEFT HEART CATH AND CORONARY ANGIOGRAPHY N/A 04/29/2018   Procedure: LEFT HEART CATH AND CORONARY ANGIOGRAPHY;  Surgeon: Swaziland, Peter M, MD;  Location: [facility] INVASIVE CV LAB;  Service: Cardiovascular;  Laterality: N/A;  . POSTERIOR CERVICAL FUSION/FORAMINOTOMY N/A 01/24/2017   Procedure: Cervical Three-Thoracic One-Laminectomy/Foraminotomy with Cervical Three-Thoracic One Fixation and fusion;  Surgeon: Loura Halt Ditty, MD;  Location: [facility] OR;  Service: Neurosurgery;  Laterality: N/A;  Part-2 Posterior approach  . RECONSTRUCT / STABILIZE DISTAL ULNA    . SHOULDER OPEN ROTATOR CUFF REPAIR Left 2010  . SHOULDER SURGERY Left    "injured on the job; had to cut small bone out"  . THUMB FUSION Right         Home Medications    Prior to Admission medications   Medication Sig Start Date End Date Taking? Authorizing Provider  acetaminophen (TYLENOL) 325 MG tablet Take 2 tablets (650 mg total) by mouth every 6 (six) hours as needed for mild pain (or Fever >/= 101). Patient not taking: Reported on 08/14/2018 05/07/18   Shon Hale, MD  acetaminophen (TYLENOL) 500 MG tablet Take 1,000 mg by mouth every 6 (six) hours as needed for headache (pain).    [provider]  allopurinol (ZYLOPRIM) 300 MG tablet TAKE 1 TABLET (300 MG TOTAL) BY MOUTH DAILY. 09/22/18   Marcine Matar, MD    amLODipine (NORVASC) 5 MG tablet Take 1 tablet (5 mg total) by mouth daily. 12/18/18   Marcine Matar, MD  Calcium Carb-Cholecalciferol (CALCIUM-VITAMIN D) 600-400 MG-UNIT TABS Take 1 tablet by mouth daily. 05/07/18   Shon Hale, MD  clotrimazole (LOTRIMIN) 1 % cream Apply 1 application topically 2 (two) times daily.    [provider]  divalproex (DEPAKOTE) 500 MG DR tablet Take 1 tablet (500 mg total) by mouth 2 (two) times daily. 05/07/18   Shon Hale, MD  DULoxetine (CYMBALTA) 30 MG capsule Take 1 capsule (30 mg total) by mouth 2 (two) times daily. 05/07/18   Shon Hale, MD  folic acid (FOLVITE) 1 MG tablet Take 1 tablet (1 mg total) by mouth daily. 05/07/18   Shon Hale, MD  gabapentin (NEURONTIN) 400 MG capsule Take 1 capsule (400 mg total) by mouth 2 (two) times daily. Patient not taking: Reported on 08/14/2018 05/07/18   Shon Hale, MD  gabapentin (NEURONTIN) 600 MG  tablet Take 600 mg by mouth 4 (four) times daily. 07/24/18   [provider]  hydrOXYzine (ATARAX/VISTARIL) 25 MG tablet Take 1 tablet (25 mg total) by mouth every 6 (six) hours as needed. for anxiety 08/07/18   Marcine Matar, MD  LORazepam (ATIVAN) 0.5 MG tablet Take 1 tablet (0.5 mg total) by mouth every 8 (eight) hours as needed for anxiety. 05/07/18 05/07/19  Shon Hale, MD  losartan (COZAAR) 100 MG tablet Take 1 tablet (100 mg total) by mouth daily. 12/18/18   Marcine Matar, MD  meclizine (ANTIVERT) 25 MG tablet Take 1 tablet (25 mg total) by mouth daily as needed for dizziness or nausea (vertigo). 06/13/18   Marcine Matar, MD  methocarbamol (ROBAXIN) 750 MG tablet Take 1 tablet (750 mg total) by mouth every 8 (eight) hours as needed for muscle spasms. 05/07/18   Shon Hale, MD  metoCLOPramide (REGLAN) 10 MG tablet TAKE 1 TABLET BY MOUTH EVERY 6 HOURS AS NEEDED FOR NAUSEA OR HEADACHE. Patient taking differently: Take 10 mg by mouth every 6 (six) hours as  needed for nausea (headache).  06/13/18   Marcine Matar, MD  Multiple Vitamin (MULTIVITAMIN WITH MINERALS) TABS tablet Take 1 tablet by mouth daily. 05/07/18   Shon Hale, MD  nitroGLYCERIN (NITROSTAT) 0.4 MG SL tablet Place 1 tablet (0.4 mg total) under the tongue every 5 (five) minutes as needed for chest pain. 05/02/17   Quentin Angst, MD  omeprazole (PRILOSEC) 20 MG capsule Take 1 capsule (20 mg total) by mouth daily for 14 days. 07/30/18 08/13/18  Linwood Dibbles, MD  oxyCODONE-acetaminophen (PERCOCET) 5-325 MG tablet Take 2 tablets by mouth every 4 (four) hours as needed. 10/16/18   Fayrene Helper, PA-C  oxyCODONE-acetaminophen (PERCOCET) 7.5-325 MG tablet Take 1 tablet by mouth every 4 (four) hours as needed for severe pain.    [provider]  rosuvastatin (CRESTOR) 40 MG tablet Take 1 tablet (40 mg total) by mouth daily. 05/07/18   Shon Hale, MD  tamsulosin (FLOMAX) 0.4 MG CAPS capsule Take 1 capsule (0.4 mg total) by mouth at bedtime. 09/08/18   Marcine Matar, MD  traZODone (DESYREL) 100 MG tablet Take 1 tablet (100 mg total) by mouth at bedtime as needed for sleep. 05/07/18   Shon Hale, MD  warfarin (COUMADIN) 5 MG tablet Take as directed by the [facility]. 12/18/18   Marcine Matar, MD    Family History Family History  Problem Relation Age of Onset  . Heart attack Mother        x7. Still alive  . Heart attack Father        deceased b/c of MI  . Heart attack Brother        older, deceased b/c of MI   . Cancer Brother        bone cancer in 1 brother, ? brain cancer in other brother  . Heart attack Brother   . Other Brother        MVA  . Other Brother   . Bone cancer Brother   . Cancer Brother   . SIDS Sister     Social History Social History   Tobacco Use  . Smoking status: Never Smoker  . Smokeless tobacco: Never Used  Substance Use Topics  . Alcohol use: Yes    Comment: 16 oz beer every 2-3 days  . Drug use: Yes    Types:  "Crack" cocaine, Marijuana, Cocaine    Comment:  "quit  2008"     Allergies   Shellfish allergy   Review of Systems Review of Systems  Unable to perform ROS: Acuity of condition     Physical Exam Updated Vital Signs Wt 98.2 kg   BMI 30.19 kg/m   Physical Exam Vitals signs and nursing note reviewed.  Constitutional:      General: He is not in acute distress.    Appearance: He is well-developed. He is not diaphoretic.  HENT:     Head: Normocephalic and atraumatic.  Neck:     Musculoskeletal: Normal range of motion and neck supple.  Cardiovascular:     Rate and Rhythm: Normal rate and regular rhythm.     Heart sounds: No murmur. No friction rub.  Pulmonary:     Effort: Pulmonary effort is normal. No respiratory distress.     Breath sounds: Normal breath sounds. No wheezing or rales.  Abdominal:     General: Bowel sounds are normal. There is no distension.     Palpations: Abdomen is soft.     Tenderness: There is no abdominal tenderness.  Musculoskeletal: Normal range of motion.  Skin:    General: Skin is warm and dry.  Neurological:     Mental Status: He is alert.     Coordination: Coordination normal.     Comments: Patient is awake.  His eyes are open and looking around.  He is noted to have a left-sided hemiparesis with a left hemi-anopsia.      ED Treatments / Results  Labs (all labs ordered are listed, but only abnormal results are displayed) Labs Reviewed  PROTIME-INR - Abnormal; Notable for the following components:      Result Value   Prothrombin Time 16.7 (*)    INR 1.4 (*)    All other components within normal limits  I-STAT CREATININE, ED - Abnormal; Notable for the following components:   Creatinine, Ser 1.40 (*)    All other components within normal limits  APTT  CBC  DIFFERENTIAL  COMPREHENSIVE METABOLIC PANEL  CBG MONITORING, ED    EKG EKG Interpretation  Date/Time:  Wednesday December 24 2018 22:27:39 EST Ventricular Rate:  101 PR  Interval:    QRS Duration: 96 QT Interval:  345 QTC Calculation: 448 R Axis:   30 Text Interpretation:  Sinus tachycardia Abnormal R-wave progression, early transition Left ventricular hypertrophy Confirmed by Geoffery Lyons ([id number]) on 12/24/2018 10:31:24 PM   Radiology Ct Head Code Stroke Wo Contrast  Result Date: 12/24/2018 CLINICAL DATA:  Code stroke. Left facial droop and left-sided weakness. EXAM: CT HEAD WITHOUT CONTRAST TECHNIQUE: Contiguous axial images were obtained from the base of the skull through the vertex without intravenous contrast. COMPARISON:  Head CT 05/05/2018 FINDINGS: Brain: There is no mass, hemorrhage or extra-axial collection. The size and configuration of the ventricles and extra-axial CSF spaces are normal. There is hypoattenuation of the periventricular white matter, most commonly indicating chronic ischemic microangiopathy. Vascular: No abnormal hyperdensity of the major intracranial arteries or dural venous sinuses. No intracranial atherosclerosis. Skull: The visualized skull base, calvarium and extracranial soft tissues are normal. Sinuses/Orbits: No fluid levels or advanced mucosal thickening of the visualized paranasal sinuses. No mastoid or middle ear effusion. The orbits are normal. ASPECTS [facility] Stroke Program Early CT Score) - Ganglionic level infarction (caudate, lentiform nuclei, internal capsule, insula, M1-M3 cortex): 7 - Supraganglionic infarction (M4-M6 cortex): 3 Total score (0-10 with 10 being normal): 10 IMPRESSION: 1. No acute hemorrhage. 2. ASPECTS is 10. * These results  were communicated to Dr. Ritta Slot at 9:58 pm on 12/24/2018 by text page via the [facility] messaging system. Electronically Signed   By: Deatra Robinson M.D.   On: 12/24/2018 21:59    Procedures Procedures (including critical care time)  Medications Ordered in ED Medications  sodium chloride flush (NS) 0.9 % injection 3 mL (has no administration in time range)  iopamidol  (ISOVUE-370) 76 % injection 50 mL (has no administration in time range)  iohexol (OMNIPAQUE) 350 MG/ML injection 100 mL (100 mLs Intravenous Contrast Given 12/24/18 2205)     Initial Impression / Assessment and Plan / ED Course  I have reviewed the triage vital signs and the nursing notes.  Pertinent labs & imaging results that were available during my care of the patient were reviewed by me and considered in my medical decision making (see chart for details).  Patient presents here with a left-sided hemiparesis and left hemi-anopsia.  He arrived as a code stroke by EMS.  He was seen immediately by myself and neurology and went for an emergent CT scan of the head.  This was read as unremarkable.  He underwent additional studies including CT angios of the head and neck and cerebral perfusion imaging.  The studies were also unremarkable.  Given the patient's presentation, Dr. Amada Jupiter is considering TPA.  Patient will be admitted by the neurology service for further observation.  CRITICAL CARE Performed by: Geoffery Lyons Total critical care time: 45 minutes Critical care time was exclusive of separately billable procedures and treating other patients. Critical care was necessary to treat or prevent imminent or life-threatening deterioration. Critical care was time spent personally by me on the following activities: development of treatment plan with patient and/or surrogate as well as nursing, discussions with consultants, evaluation of patient's response to treatment, examination of patient, obtaining history from patient or surrogate, ordering and performing treatments and interventions, ordering and review of laboratory studies, ordering and review of radiographic studies, pulse oximetry and re-evaluation of patient's condition.   Final Clinical Impressions(s) / ED Diagnoses   Final diagnoses:  Fx    ED Discharge Orders    None       Geoffery Lyons, MD 12/25/18 2316

## 2018-12-24 NOTE — H&P (Signed)
Neurology H&P  CC: Left-sided weakness  History is obtained from: Patient  HPI: Russell Carter is a 63 y.o. male with a history of spinal surgery with some baseline left-sided weakness who was in his normal state of health talking to his family earlier tonight.  Around 9:05 PM, he left to enter the kitchen and following this they heard him fall.  On arrival into the kitchen they found him with weakness on the left side.  EMS was activated and he was brought to the emergency department.  In the ER, he was found to have a left hemianopia as well as left hemianopia.  He was taken for stat CT/CTA which was negative as was CTP.  Given he still had a dense left hemiparesis and hemianopia,  Of note, though he has a history of "seizures" he apparently has generalized tremoring prior to losing consciousness and there is question of whether these were actual seizures.  Also, following TPA administration, he had some tremoring type movements of his left leg.  These were not clearly seizure-like.  Also of note, he had some blood streaking his stool approximately 2 weeks ago, but none in the past few days.  LKW: 9 PM tpa given?:  Yes MRS: 1  ROS: A 14 point ROS was performed and is negative except as noted in the HPI.   Past Medical History:  Diagnosis Date  . Acute kidney injury (HCC) 04/27/2018  . AKI (acute kidney injury) (HCC)   . Anginal pain (HCC)   . Anxiety   . Arthritis   . Bipolar disorder (HCC)   . Blood in stool   . CAD (coronary artery disease)    a. 2011: cath showing mod non-obstructive disease b. 02/2016: cath with 95% stenosis in the Mid RCA. DES placed.  . Cervical stenosis of spine   . Chronic anticoagulation 04/27/2018  . Chronic pain disorder 06/05/2017  . CKD (chronic kidney disease), stage III (HCC) 05/06/2018  . Cocaine abuse (HCC)    history  . Complete heart block (HCC) 03/14/2016  . Depression   . Diabetes mellitus type 2 in nonobese (HCC)   . Diabetes mellitus without  complication (HCC)   . Dyslipidemia 03/15/2016  . Essential hypertension 05/18/2009   Qualifier: Diagnosis of  By: Kem Parkinson    . Gastroesophageal reflux disease without esophagitis 07/31/2017  . Gout, unspecified   . Headache   . History of CVA (cerebrovascular accident)   . History of pulmonary embolus (PE) 02/14/2017  . HTN (hypertension)   . Hypercholesterolemia   . Hyperglycemia   . Leukopenia 05/06/2018  . Migraines   . Myocardial infarction [facility]) 2008   drug cocaine and marijuana at time of mi none  since    . Normocytic anemia 05/06/2018  . Pneumonia   . Polysubstance abuse (HCC) 01/15/2013  . Rectal bleeding   . Renal insufficiency   . Seizure (HCC) 05/06/2018  . Seizures (HCC)   . Stroke [facility])      Family History  Problem Relation Age of Onset  . Heart attack Mother        x7. Still alive  . Heart attack Father        deceased b/c of MI  . Heart attack Brother        older, deceased b/c of MI   . Cancer Brother        bone cancer in 1 brother, ? brain cancer in other brother  . Heart attack Brother   .  Other Brother        MVA  . Other Brother   . Bone cancer Brother   . Cancer Brother   . SIDS Sister      Social History:  reports that he has never smoked. He has never used smokeless tobacco. He reports current alcohol use. He reports current drug use. Drugs: "Crack" cocaine, Marijuana, and Cocaine.   Exam: Current vital signs: Wt 98.2 kg   BMI 30.19 kg/m  Vital signs in last 24 hours: Weight:  [98.2 kg] 98.2 kg (03/04 2100)  Physical Exam  Constitutional: Appears well-developed and well-nourished.  Psych: Affect appropriate to situation Eyes: No scleral injection HENT: No OP obstrucion Head: Normocephalic.  Cardiovascular: Normal rate and regular rhythm.  Respiratory: Effort normal and breath sounds normal to anterior ascultation GI: Soft.  No distension. There is no tenderness.  Skin: WDI  Neuro: Mental Status: Patient is awake,  alert, oriented to person, place, age, but unable to give the month. Patient is able to give a clear and coherent history. No signs of aphasia or neglect Cranial Nerves: II: He has a left hemianopia. Pupils are equal, round, and reactive to light.   III,IV, VI: EOMI without ptosis or diploplia.  V: Facial sensation is diminished on the left VII: Facial movement slightly diminished on the left VIII: hearing is intact to voice X: Uvula elevates symmetrically XI: Shoulder shrug is symmetric. XII: tongue is midline without atrophy or fasciculations.  Motor: Tone is normal. Bulk is normal.  He has 3/5 strength of the left arm and leg Sensory: Sensation is markedly impaired throughout the left side, he does not respond to nailbed pressure  Cerebellar: FNF intact on the right, unable to perform in the left  I have reviewed labs in epic and the results pertinent to this consultation are: INR 1.4 CMP-mild acidosis with a bicarb of 17  I have reviewed the images obtained: CTA/CTP-negative  Primary Diagnosis:  Cerebral infarction, unspecified   Secondary Diagnosis: Essential (primary) hypertension and Paroxysmal atrial fibrillation CKD Diabetes type 2 without complication Cocaine abuse  Impression: 63 year old male presenting with acute left-sided weakness and hemianopia.  Especially in the setting of a subtherapeutic INR with atrial fibrillation, the most likely diagnosis is acute ischemic stroke.  He received IV TPA.  Due to the abnormal movements that occurred as well as the negative CT perfusion, I would favor excluding partial seizure as a cause of his current symptoms, but I think this is low likelihood.  I suspect that he had some bleeding from hemorrhoids, and given that his current guaiac is negative and there are no other treatment options I did offer IV TPA, I discussed with him the risks of intracranial bleeding, and the increased risk of GI bleeding given his recent  blood-streaked stools.  Though this is a real risk, I feel with the severity of his symptoms and the lack of other treatment options it is reasonable to proceed with this.  Recommendations: - HgbA1c, fasting lipid panel - MRI of the brain without contrast - Frequent neuro checks - Echocardiogram - Prophylactic therapy-none for 24 hours, would consider novel anticoagulant - Risk factor modification - Telemetry monitoring - PT consult, OT consult, Speech consult - Stroke team to follow -Stat EEG  This patient is critically ill and at significant risk of neurological worsening, death and care requires constant monitoring of vital signs, hemodynamics,respiratory and cardiac monitoring, neurological assessment, discussion with family, other specialists and medical decision making of high complexity.  I spent 45 minutes of neurocritical care time  in the care of  this patient. This was time spent independent of any time provided by nurse practitioner or PA.  Ritta Slot, MD [facility] [phone]  If 7pm- 7am, please page neurology on call as listed in AMION. 12/24/2018  10:00 PM

## 2018-12-24 NOTE — ED Triage Notes (Signed)
Pt brought in by EMS after pt was found down at home by his family. Pt went into the kitchen to get something when his left arm and leg went numb and he collapsed. At the scene the pt was c/o neck and back pain with numbness in left arm and leg with left sided facial droop and sensory loss to the left face. Pt was also /o blurry vision in the left eye. Pt states that he has been having CP x 1 week and that he took 2 nitro prior to this incident. Vitals prior to arrival were BP 146/88, HR 92, RR 14, and CBG 208. 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

## 2018-12-24 NOTE — Progress Notes (Signed)
PHARMACIST CODE STROKE RESPONSE  Notified to mix tPA at 22:12 by Dr. Amada Jupiter Delivered tPA to RN at 22:14  tPA dose = 8.8 mg bolus over 1 minute followed by 79.6 mg for a total dose of 88.4 mg over 1 hour  Issues/delays encountered (if applicable): patient stated may have had blood in stool prior to giving tPA. Checked coag stool which was negative prior to administration.   Russell Carter 12/24/18 10:32 PM

## 2018-12-25 ENCOUNTER — Inpatient Hospital Stay (HOSPITAL_COMMUNITY): Payer: Medicaid Other

## 2018-12-25 DIAGNOSIS — I6300 Cerebral infarction due to thrombosis of unspecified precerebral artery: Secondary | ICD-10-CM

## 2018-12-25 DIAGNOSIS — I351 Nonrheumatic aortic (valve) insufficiency: Secondary | ICD-10-CM

## 2018-12-25 LAB — CBC
HCT: 43.5 % (ref 39.0–52.0)
Hemoglobin: 14.6 g/dL (ref 13.0–17.0)
MCH: 29.5 pg (ref 26.0–34.0)
MCHC: 33.6 g/dL (ref 30.0–36.0)
MCV: 87.9 fL (ref 80.0–100.0)
NRBC: 0 % (ref 0.0–0.2)
Platelets: 187 10*3/uL (ref 150–400)
RBC: 4.95 MIL/uL (ref 4.22–5.81)
RDW: 14.4 % (ref 11.5–15.5)
WBC: 4.9 10*3/uL (ref 4.0–10.5)

## 2018-12-25 LAB — HEMOGLOBIN A1C
Hgb A1c MFr Bld: 6.3 % — ABNORMAL HIGH (ref 4.8–5.6)
Mean Plasma Glucose: 134.11 mg/dL

## 2018-12-25 LAB — ECHOCARDIOGRAM COMPLETE
HEIGHTINCHES: 71 in
Weight: 3463.87 oz

## 2018-12-25 LAB — MRSA PCR SCREENING: MRSA by PCR: NEGATIVE

## 2018-12-25 LAB — LIPID PANEL
Cholesterol: 162 mg/dL (ref 0–200)
HDL: 45 mg/dL (ref >40)
LDL Cholesterol: 47 mg/dL (ref 0–99)
Total CHOL/HDL Ratio: 3.6 RATIO
Triglycerides: 351 mg/dL — ABNORMAL HIGH (ref <150)
VLDL: 70 mg/dL — ABNORMAL HIGH (ref 0–40)

## 2018-12-25 LAB — GLUCOSE, CAPILLARY
GLUCOSE-CAPILLARY: 142 mg/dL — AB (ref 70–99)
Glucose-Capillary: 110 mg/dL — ABNORMAL HIGH (ref 70–99)
Glucose-Capillary: 111 mg/dL — ABNORMAL HIGH (ref 70–99)
Glucose-Capillary: 131 mg/dL — ABNORMAL HIGH (ref 70–99)

## 2018-12-25 LAB — TROPONIN I
Troponin I: <0.03 ng/mL (ref <0.03)
Troponin I: <0.03 ng/mL (ref <0.03)
Troponin I: <0.03 ng/mL (ref <0.03)

## 2018-12-25 MED ORDER — ASPIRIN EC 81 MG PO TBEC
81.0000 mg | DELAYED_RELEASE_TABLET | Freq: Every day | ORAL | Status: DC
  Administered 2018-12-25: 81 mg via ORAL
  Filled 2018-12-25: qty 1

## 2018-12-25 MED ORDER — OXYCODONE-ACETAMINOPHEN 7.5-325 MG PO TABS
1.0000 | ORAL_TABLET | Freq: Three times a day (TID) | ORAL | Status: DC | PRN
  Administered 2018-12-25 – 2018-12-26 (×3): 1 via ORAL
  Filled 2018-12-25 (×3): qty 1

## 2018-12-25 MED ORDER — OXYCODONE-ACETAMINOPHEN 5-325 MG PO TABS: 2.0000 | ORAL_TABLET | ORAL | Status: DC | PRN

## 2018-12-25 MED ORDER — INSULIN ASPART 100 UNIT/ML ~~LOC~~ SOLN
0.0000 [IU] | Freq: Three times a day (TID) | SUBCUTANEOUS | Status: DC
  Administered 2018-12-26: 1 [IU] via SUBCUTANEOUS

## 2018-12-25 MED ORDER — MORPHINE SULFATE (PF) 2 MG/ML IV SOLN
1.0000 mg | Freq: Once | INTRAVENOUS | Status: AC
  Administered 2018-12-25: 1 mg via INTRAVENOUS

## 2018-12-25 MED ORDER — MORPHINE 100MG IN NS 100ML (1MG/ML) PREMIX INFUSION: 1.0000 mg/h | Freq: Once | INTRAVENOUS | Status: DC

## 2018-12-25 MED ORDER — INSULIN ASPART 100 UNIT/ML ~~LOC~~ SOLN: 0.0000 [IU] | Freq: Every day | SUBCUTANEOUS | Status: DC

## 2018-12-25 MED ORDER — MORPHINE SULFATE (PF) 2 MG/ML IV SOLN
INTRAVENOUS | Status: AC
  Filled 2018-12-25: qty 1

## 2018-12-25 NOTE — Evaluation (Signed)
Physical Therapy Evaluation Patient Details Name: Russell Carter MRN: [id number] DOB: [birth year] Today's Date: 12/25/2018   History of Present Illness  63 yo with fall at home with left hemiplegia and hemianopia s/p tPA with CT and MRI negative. PMhx: polysubstance abuse, seizure, gout, depression, bipolar, HTN, HLD, DM, CAD, cervical laminectomy 2018, C3-T1 fusion, pt noted to have left knee effusion and pain 09/2018  Clinical Impression  Pt in bed upon arrival and lethargic throughout session. Pt presents with decreased activity tolerance, balance and strength deficits limiting his ability to return home safely. Pt presents with inconsistency with muscle strength testing of LLE. Pt had one episode during ambulation where he stated he felt like he was going to pass out but BP assessed and elevated in sitting after ambulation. Pt would benefit from skilled therapy to improve balance to decrease fall risk, activity tolerance and strength to increase independence and safety at home. Pt and family educated on plan to continue therapy acutely and recommendation of home health once discharged.     Follow Up Recommendations Home health PT;Supervision for mobility/OOB    Equipment Recommendations  Rolling walker with 5" wheels    Recommendations for Other Services OT consult     Precautions / Restrictions        Mobility  Bed Mobility Overal bed mobility: Modified Independent             General bed mobility comments: increased time with bed flat  Transfers Overall transfer level: Needs assistance Equipment used: Rolling walker (2 wheeled) Transfers: Sit to/from Stand Sit to Stand: Min assist      cuing for hand placement       Ambulation/Gait Ambulation/Gait assistance: Min assist;+2 safety/equipment Gait Distance (Feet): 50 Feet Assistive device: Rolling walker (2 wheeled) Gait Pattern/deviations: Step-through pattern;Decreased stride length     General Gait Details: pt  ambulates with decreased gait speed and decreased knee flexion on left, pt had an episode of slow bilateral knee buckling during gait where he closed his eyes and required mod A x2 to fully recover, after the pt stated he felt like he was going to pass out, pt able to respond to cuing to continue ambulation, BP assessed and stable   Stairs            Wheelchair Mobility    Modified Rankin (Stroke Patients Only)       Balance Overall balance assessment: History of Falls;Needs assistance   Sitting balance-Leahy Scale: Fair Sitting balance - Comments: pt able to sit EOB and don both socks    Standing balance support: Bilateral upper extremity supported Standing balance-Leahy Scale: Poor Standing balance comment: pt reliant on Bil UE support, pt reports three falls in the last year all where he states his legs just go numb                             Pertinent Vitals/Pain Pain Score: 7  Pain Location: neck and back Pain Descriptors / Indicators: Aching;Constant Pain Intervention(s): Limited activity within patient's tolerance;Monitored during session;Repositioned    Home Living Family/patient expects to be discharged to:: Private residence Living Arrangements: Spouse/significant other Available Help at Discharge: Available 24 hours/day;Family Type of Home: House Home Access: Stairs to enter Entrance Stairs-Rails: Doctor, general practice of Steps: 3 Home Layout: One level Home Equipment: Cane - single point Additional Comments: wears glasses at all times, not currently present    Prior Function Level of Independence: Independent  with assistive device(s)         Comments: cane for walking     Hand Dominance        Extremity/Trunk Assessment        Lower Extremity Assessment Lower Extremity Assessment: (5/5 all myotomes on RLE) LLE Deficits / Details: 2-/5 hip flexion & knee extension, 2/5 dorsiflexion (EOB prior to standing), after gait  :demonstrated 2+/5 knee extension able to resist although did not complete full rOM, hip flexion 4/5, 3/5 dorsiflexion    Cervical / Trunk Assessment Cervical / Trunk Assessment: Other exceptions Cervical / Trunk Exceptions: pt reports neck and back pain, forward head, rounded shoulders  Communication   Communication: No difficulties  Cognition Arousal/Alertness: Awake/alert Behavior During Therapy: Flat affect Overall Cognitive Status: Impaired/Different from baseline Area of Impairment: Problem solving;Safety/judgement                         Safety/Judgement: Decreased awareness of safety;Decreased awareness of deficits   Problem Solving: Slow processing        General Comments      Exercises     Assessment/Plan    PT Assessment Patient needs continued PT services  PT Problem List Decreased strength;Decreased balance;Decreased cognition;Decreased knowledge of use of DME;Decreased mobility;Decreased activity tolerance;Decreased safety awareness       PT Treatment Interventions DME instruction;Functional mobility training;Balance training;Patient/family education;Gait training;Therapeutic activities;Stair training;Therapeutic exercise;Cognitive remediation;Neuromuscular re-education    PT Goals (Current goals can be found in the Care Plan section)  Acute Rehab PT Goals Patient Stated Goal: return home PT Goal Formulation: With patient/family Time For Goal Achievement: 01/08/19 Potential to Achieve Goals: Fair    Frequency Min 3X/week   Barriers to discharge Decreased caregiver support      Co-evaluation               AM-PAC PT "6 Clicks" Mobility  Outcome Measure Help needed turning from your back to your side while in a flat bed without using bedrails?: A Little Help needed moving from lying on your back to sitting on the side of a flat bed without using bedrails?: A Little Help needed moving to and from a bed to a chair (including a  wheelchair)?: A Little Help needed standing up from a chair using your arms (e.g., wheelchair or bedside chair)?: A Little Help needed to walk in hospital room?: A Lot Help needed climbing 3-5 steps with a railing? : A Lot 6 Click Score: 16    End of Session Equipment Utilized During Treatment: Gait belt Activity Tolerance: Patient limited by fatigue Patient left: in chair;with call bell/phone within reach;with chair alarm set;with family/visitor present Nurse Communication: Mobility status;Precautions PT Visit Diagnosis: Unsteadiness on feet (R26.81);Muscle weakness (generalized) (M62.81);Other abnormalities of gait and mobility (R26.89)    Time: 1133-1206 PT Time Calculation (min) (ACUTE ONLY): 33 min   Charges:   PT Evaluation $PT Eval Moderate Complexity: 1 Mod PT Treatments $Gait Training: 8-22 mins        Brookelyn Gaynor, [place] [phone]   Dalary Hollar 12/25/2018, 1:31 PM

## 2018-12-25 NOTE — Plan of Care (Signed)
Patient passed RN bedside swallow and carb modified diet has been placed.  Will continue to monitor. Jaclyn Shaggy RN

## 2018-12-25 NOTE — Procedures (Addendum)
History: 63 year old male presenting with left-sided weakness, had some tremor  Sedation: None  Technique: This is a 21 channel routine scalp EEG performed at the bedside with bipolar and monopolar montages arranged in accordance to the international 10/20 system of electrode placement. One channel was dedicated to EKG recording.    Background: The entirety of this EEG was performed during sleep or drowsiness, though during a brief arousal a posterior dominant rhythm was briefly seen with a frequency of 9 Hz.  Sleep structures are symmetric with normal appearance.  No epileptiform discharges were seen.  Photic stimulation: Physiologic driving is not performed  EEG Abnormalities: None  Clinical Interpretation: This normal EEG is recorded in the drowsy and sleep state. There was no seizure or seizure predisposition recorded on this study. Please note that lack of epileptiform activity on EEG does not preclude the possibility of epilepsy.   Ritta Slot, MD [facility] [phone]  If 7pm- 7am, please page neurology on call as listed in AMION.

## 2018-12-25 NOTE — Evaluation (Signed)
Speech Language Pathology Evaluation Patient Details Name: Russell Carter MRN: [id number] DOB: 08-31-1956 Today's Date: 12/25/2018 Time: 0835-0908 SLP Time Calculation (min) (ACUTE ONLY): 33 min  Problem List:  Patient Active Problem List   Diagnosis Date Noted  . Stroke (HCC) 12/24/2018  . CKD (chronic kidney disease), stage III (HCC) 05/06/2018  . Seizure (HCC) 05/06/2018  . Normocytic anemia 05/06/2018  . Leukopenia 05/06/2018  . AKI (acute kidney injury) (HCC)   . Acute kidney injury (HCC) 04/27/2018  . Abdominal pain 04/27/2018  . Chronic anticoagulation 04/27/2018  . Left knee pain 01/10/2018  . Numbness and tingling in both hands 07/31/2017  . Gastroesophageal reflux disease without esophagitis 07/31/2017  . Chronic pain disorder 06/05/2017  . History of pulmonary embolus (PE) 02/14/2017  . Diabetes mellitus type 2 in nonobese (HCC)   . Cocaine abuse (HCC)   . Depression   . History of CVA (cerebrovascular accident)   . Cervical spondylosis with radiculopathy 01/24/2017  . Dyslipidemia 03/15/2016  . Complete heart block (HCC) 03/14/2016  . Cervical spinal stenosis 03/14/2016  . CAD-50% LAD 2011 12/14/2014  . Gout 11/16/2014  . Polysubstance abuse (HCC) 01/15/2013  . DIZZINESS 06/05/2010  . Essential hypertension 05/18/2009   Past Medical History:  Past Medical History:  Diagnosis Date  . Acute kidney injury (HCC) 04/27/2018  . AKI (acute kidney injury) (HCC)   . Anginal pain (HCC)   . Anxiety   . Arthritis   . Bipolar disorder (HCC)   . Blood in stool   . CAD (coronary artery disease)    a. 2011: cath showing mod non-obstructive disease b. 02/2016: cath with 95% stenosis in the Mid RCA. DES placed.  . Cervical stenosis of spine   . Chronic anticoagulation 04/27/2018  . Chronic pain disorder 06/05/2017  . CKD (chronic kidney disease), stage III (HCC) 05/06/2018  . Cocaine abuse (HCC)    history  . Complete heart block (HCC) 03/14/2016  . Depression   . Diabetes  mellitus type 2 in nonobese (HCC)   . Diabetes mellitus without complication (HCC)   . Dyslipidemia 03/15/2016  . Essential hypertension 05/18/2009   Qualifier: Diagnosis of  By: Kem Parkinson    . Gastroesophageal reflux disease without esophagitis 07/31/2017  . Gout, unspecified   . Headache   . History of CVA (cerebrovascular accident)   . History of pulmonary embolus (PE) 02/14/2017  . HTN (hypertension)   . Hypercholesterolemia   . Hyperglycemia   . Leukopenia 05/06/2018  . Migraines   . Myocardial infarction [facility]) 2008   drug cocaine and marijuana at time of mi none  since    . Normocytic anemia 05/06/2018  . Pneumonia   . Polysubstance abuse (HCC) 01/15/2013  . Rectal bleeding   . Renal insufficiency   . Seizure (HCC) 05/06/2018  . Seizures (HCC)   . Stroke [facility])    Past Surgical History:  Past Surgical History:  Procedure Laterality Date  . ANTERIOR CERVICAL DECOMP/DISCECTOMY FUSION N/A 01/24/2017   Procedure: Cervical Four-Five,Cervical Five Six,Cervical Six-Seven Anterior cervical discectomy with fusion and plate with Cervical Three-Thoracic- One Laminectomy/Foraminotomy with Cervical Three-Thoracic- One Fixation and fusion;  Surgeon: Loura Halt Ditty, MD;  Location: [facility] OR;  Service: Neurosurgery;  Laterality: N/A;  Part-1 anterior approach  . CARDIAC CATHETERIZATION  2008; ~ 2011   [place]-40%lad  . CARDIAC CATHETERIZATION N/A 03/16/2016   Procedure: Left Heart Cath and Coronary Angiography;  Surgeon: Marykay Lex, MD;  Location: [facility] INVASIVE CV LAB;  Service: Cardiovascular;  Laterality: N/A;  . COLONOSCOPY WITH PROPOFOL N/A 04/17/2018   Procedure: COLONOSCOPY WITH PROPOFOL;  Surgeon: Graylin Shiver, MD;  Location: [facility] ENDOSCOPY;  Service: Endoscopy;  Laterality: N/A;  . CORONARY ANGIOPLASTY     STENTS  . EYE SURGERY Bilateral    "for double vision"  . INGUINAL HERNIA REPAIR  05/12/2012   Procedure: HERNIA REPAIR INGUINAL ADULT;  Surgeon: Liz Malady, MD;   Location: [facility];  Service: General;  Laterality: Right;  . INGUINAL HERNIA REPAIR Left 02/02/2003   Dr. Violeta Gelinas. repair with mesh. Same procedure done again in  05/22/2004   . INGUINAL HERNIA REPAIR Left ?date 2nd OR  . LEFT HEART CATH AND CORONARY ANGIOGRAPHY N/A 04/29/2018   Procedure: LEFT HEART CATH AND CORONARY ANGIOGRAPHY;  Surgeon: [place], Peter M, MD;  Location: [facility] INVASIVE CV LAB;  Service: Cardiovascular;  Laterality: N/A;  . POSTERIOR CERVICAL FUSION/FORAMINOTOMY N/A 01/24/2017   Procedure: Cervical Three-Thoracic One-Laminectomy/Foraminotomy with Cervical Three-Thoracic One Fixation and fusion;  Surgeon: Loura Halt Ditty, MD;  Location: [facility] OR;  Service: Neurosurgery;  Laterality: N/A;  Part-2 Posterior approach  . RECONSTRUCT / STABILIZE DISTAL ULNA    . SHOULDER OPEN ROTATOR CUFF REPAIR Left 2010  . SHOULDER SURGERY Left    "injured on the job; had to cut small bone out"  . THUMB FUSION Right    HPI:   Pt is a 63 y.o. male with a history of seizures and spinal surgery with some baseline left-sided weakness who brought to the ED following a fall and his family noticing left-sided weakness. He was found to have left hemianopia in the ED and some tremoring-type movements of his left leg. TPA was administered. CT of the head from 12/24/18 was negative but the repeat CT and MRI have not been completed as yet. CTA was negative for LVO. EEG was negative for seizure or seizure predisposition.    Assessment / Plan / Recommendation Clinical Impression  Pt participated in speech/language/cognition evaluation. He denied any prior or new deficits in these areas but stated that since the fall he has not been remembering things like he used to. [facility] Cognitive Assessment 7.2 was completed to evaluate the pt's cognitive-linguistic skills. He achieved a score of 16/30 which is below the normal limits of 26 or more out of 30 and is suggestive of a moderate impairment. Pt  demonstrated difficulty in the areas of executive function, attention, mental manipulation, sentence repetition, memory, reasoning, and orientation to time. Pt's articultory precision and speech intelligibility were reduced but the impact of the cervical collar which limited mandibular movement is considered. SLP services are clinically indicated at this time to target cognition and further assess motor speech function.     SLP Assessment  SLP Recommendation/Assessment: Patient needs continued Speech Lanaguage Pathology Services SLP Visit Diagnosis: Cognitive communication deficit (R41.841)    Follow Up Recommendations  Other (comment)(Pt will benefit from continued SLP services)    Frequency and Duration min 2x/week  2 weeks      SLP Evaluation Cognition  Overall Cognitive Status: Impaired/Different from baseline Arousal/Alertness: Awake/alert Orientation Level: Oriented to person;Oriented to situation;Oriented to place;Disoriented to time Attention: Focused;Sustained Focused Attention: (Vigilance impaired: 0/1) Sustained Attention: Impaired Sustained Attention Impairment: Verbal complex(Serial 7s: 2/3) Memory: Impaired Memory Impairment: Retrieval deficit;Decreased short term memory;Decreased recall of new information Decreased Short Term Memory: Verbal complex(Immediate: 4/5; Delayed: 3/5) Awareness: Appears intact Problem Solving: Appears intact Executive Function: Sequencing;Organizing;Reasoning Reasoning: Impaired Reasoning Impairment: Verbal complex(Abstraction: 1/2)  Sequencing: Impaired(Clock drawing: 2/3) Sequencing Impairment: Verbal complex       Comprehension  Auditory Comprehension Overall Auditory Comprehension: Appears within functional limits for tasks assessed Yes/No Questions: Within Functional Limits Commands: Within Functional Limits(Able to follow complex commands- trail: 1/1) Conversation: Complex Reading Comprehension Reading Status: Not tested     Expression Verbal Expression Overall Verbal Expression: Appears within functional limits for tasks assessed Initiation: No impairment Level of Generative/Spontaneous Verbalization: Conversation;Sentence Repetition: Impaired Level of Impairment: Sentence level(0/2) Naming: Impairment Responsive: Not tested Confrontation: (1/3) Convergent: Not tested Divergent: (7 items within 1 minute) Pragmatics: No impairment Interfering Components: Attention Written Expression Dominant Hand: Right Written Expression: (Difficulty copying cuboid: 0/1)   Oral / Motor  Oral Motor/Sensory Function Overall Oral Motor/Sensory Function: Within functional limits Motor Speech Overall Motor Speech: Impaired Respiration: Within functional limits Phonation: Low vocal intensity Resonance: Within functional limits Articulation: Impaired Level of Impairment: Sentence Intelligibility: Intelligibility reduced Word: 75-100% accurate Phrase: 75-100% accurate Sentence: 75-100% accurate Conversation: 75-100% accurate Motor Planning: Witnin functional limits Motor Speech Errors: Aware Interfering Components: (Cervical collar limited mandibular movement. ) Effective Techniques: Slow rate;Over-articulate;Increased vocal intensity   Tra Wilemon I. Vear Clock, MS, CCC-SLP Acute Rehabilitation Services Office number [phone] Pager [phone]                    Scheryl Marten 12/25/2018, 9:17 AM

## 2018-12-25 NOTE — Progress Notes (Signed)
Bedside Stat EEG completed, results pending.

## 2018-12-25 NOTE — Progress Notes (Addendum)
STROKE TEAM PROGRESS NOTE   INTERVAL HISTORY No family is at the bedside.  C- collar in place NAD. Patient complaining of neck pain, which he has had prior to admission and was being treated with percocet ( for pain). Patient's history of presenting illness was reviewed. Bizarre presentation with tremors and pain rather than with focal deficits  Vitals:   12/25/18 0630 12/25/18 0700 12/25/18 0800 12/25/18 0900  BP: 132/86 135/87 120/76 134/88  Pulse: 80 80 80 78  Resp: 18 18 (!) 31 15  Temp:   97.7 F (36.5 C)   TempSrc:   Oral   SpO2:      Weight:      Height:        CBC:  Recent Labs  Lab 12/24/18 2146 12/25/18 0801  WBC 5.1 4.9  NEUTROABS 2.8  --   HGB 15.4 14.6  HCT 47.3 43.5  MCV 88.1 87.9  PLT 242 187    Basic Metabolic Panel:  Recent Labs  Lab 12/24/18 2146 12/24/18 2155  NA 137  --   K 4.5  --   CL 110  --   CO2 17*  --   GLUCOSE 112*  --   BUN 11  --   CREATININE 1.11 1.40*  CALCIUM 8.9  --    Lipid Panel:     Component Value Date/Time   CHOL 165 06/26/2018 0842   TRIG 129 06/26/2018 0842   HDL 50 06/26/2018 0842   CHOLHDL 3.3 06/26/2018 0842   CHOLHDL 3.3 01/22/2011 0512   VLDL 17 01/22/2011 0512   LDLCALC 89 06/26/2018 0842   HgbA1c:  Lab Results  Component Value Date   HGBA1C 6.3 (H) 12/25/2018   Urine Drug Screen:     Component Value Date/Time   LABOPIA POSITIVE (A) 05/05/2018 2030   COCAINSCRNUR NONE DETECTED 05/05/2018 2030   COCAINSCRNUR NEG 03/24/2012 0947   LABBENZ NONE DETECTED 05/05/2018 2030   LABBENZ NEG 03/24/2012 0947   AMPHETMU NONE DETECTED 05/05/2018 2030   THCU NONE DETECTED 05/05/2018 2030   LABBARB (A) 05/05/2018 2030    Result not available. Reagent lot number recalled by manufacturer.    Alcohol Level     Component Value Date/Time   ETH 11 (H) 05/05/2018 2028    IMAGING Ct Angio Head W Or Wo Contrast  Result Date: 12/24/2018 CLINICAL DATA:  63 y/o M; left facial droop and left-sided weakness. EXAM: CT  ANGIOGRAPHY HEAD AND NECK CT PERFUSION BRAIN TECHNIQUE: Multidetector CT imaging of the head and neck was performed using the standard protocol during bolus administration of intravenous contrast. Multiplanar CT image reconstructions and MIPs were obtained to evaluate the vascular anatomy. Carotid stenosis measurements (when applicable) are obtained utilizing NASCET criteria, using the distal internal carotid diameter as the denominator. Multiphase CT imaging of the brain was performed following IV bolus contrast injection. Subsequent parametric perfusion maps were calculated using RAPID software. CONTRAST:  OMNIPAQUE IOHEXOL 350 MG/ML SOLN COMPARISON:  12/24/2018 CT head.  05/15/2017 MRI and MRA head. FINDINGS: CTA NECK FINDINGS Aortic arch: Bovine variant branching. Imaged portion shows no evidence of aneurysm or dissection. No significant stenosis of the major arch vessel origins. Right carotid system: No evidence of dissection, stenosis (50% or greater) or occlusion. Left carotid system: No evidence of dissection, stenosis (50% or greater) or occlusion. Vertebral arteries: Left dominant. No evidence of dissection, stenosis (50% or greater) or occlusion. Skeleton: C4-7 ACDF and 3-7 posterior fusion with laminectomy. No acute osseous abnormality identified.  Other neck: Negative. Upper chest: Ground-glass opacities in the right upper lung zone, probably atelectasis. Review of the MIP images confirms the above findings CTA HEAD FINDINGS Anterior circulation: No significant stenosis, proximal occlusion, aneurysm, or vascular malformation. Minimal non stenotic calcific atherosclerosis of carotid siphons. Posterior circulation: No significant stenosis, proximal occlusion, aneurysm, or vascular malformation. Venous sinuses: As permitted by contrast timing, patent. Anatomic variants: Diminutive right vertebral artery largely terminates in the right PICA, normal variant. Patent left posterior communicating artery.  No right posterior communicating artery or anterior communicating artery identified, likely hypoplastic or absent. Review of the MIP images confirms the above findings CT Brain Perfusion Findings: CBF (<30%) Volume: 36mL Perfusion (Tmax>6.0s) volume: 50mL Mismatch Volume: 82mL Infarction Location:Negative. IMPRESSION: 1. Patent carotid and vertebral arteries. No dissection, aneurysm, or hemodynamically significant stenosis utilizing NASCET criteria. 2. Patent anterior and posterior intracranial circulation. No large vessel occlusion, aneurysm, or significant stenosis. 3. Normal CT brain perfusion. These results were called by telephone at the time of interpretation on 12/24/2018 at 10:27 pm to Dr. Ritta Slot , who verbally acknowledged these results. Electronically Signed   By: Mitzi Hansen M.D.   On: 12/24/2018 22:30   Ct Angio Neck W Or Wo Contrast  Result Date: 12/24/2018 CLINICAL DATA:  63 y/o M; left facial droop and left-sided weakness. EXAM: CT ANGIOGRAPHY HEAD AND NECK CT PERFUSION BRAIN TECHNIQUE: Multidetector CT imaging of the head and neck was performed using the standard protocol during bolus administration of intravenous contrast. Multiplanar CT image reconstructions and MIPs were obtained to evaluate the vascular anatomy. Carotid stenosis measurements (when applicable) are obtained utilizing NASCET criteria, using the distal internal carotid diameter as the denominator. Multiphase CT imaging of the brain was performed following IV bolus contrast injection. Subsequent parametric perfusion maps were calculated using RAPID software. CONTRAST:  OMNIPAQUE IOHEXOL 350 MG/ML SOLN COMPARISON:  12/24/2018 CT head.  05/15/2017 MRI and MRA head. FINDINGS: CTA NECK FINDINGS Aortic arch: Bovine variant branching. Imaged portion shows no evidence of aneurysm or dissection. No significant stenosis of the major arch vessel origins. Right carotid system: No evidence of dissection, stenosis  (50% or greater) or occlusion. Left carotid system: No evidence of dissection, stenosis (50% or greater) or occlusion. Vertebral arteries: Left dominant. No evidence of dissection, stenosis (50% or greater) or occlusion. Skeleton: C4-7 ACDF and 3-7 posterior fusion with laminectomy. No acute osseous abnormality identified. Other neck: Negative. Upper chest: Ground-glass opacities in the right upper lung zone, probably atelectasis. Review of the MIP images confirms the above findings CTA HEAD FINDINGS Anterior circulation: No significant stenosis, proximal occlusion, aneurysm, or vascular malformation. Minimal non stenotic calcific atherosclerosis of carotid siphons. Posterior circulation: No significant stenosis, proximal occlusion, aneurysm, or vascular malformation. Venous sinuses: As permitted by contrast timing, patent. Anatomic variants: Diminutive right vertebral artery largely terminates in the right PICA, normal variant. Patent left posterior communicating artery. No right posterior communicating artery or anterior communicating artery identified, likely hypoplastic or absent. Review of the MIP images confirms the above findings CT Brain Perfusion Findings: CBF (<30%) Volume: 42mL Perfusion (Tmax>6.0s) volume: 41mL Mismatch Volume: 82mL Infarction Location:Negative. IMPRESSION: 1. Patent carotid and vertebral arteries. No dissection, aneurysm, or hemodynamically significant stenosis utilizing NASCET criteria. 2. Patent anterior and posterior intracranial circulation. No large vessel occlusion, aneurysm, or significant stenosis. 3. Normal CT brain perfusion. These results were called by telephone at the time of interpretation on 12/24/2018 at 10:27 pm to Dr. Ritta Slot , who verbally acknowledged these results. Electronically  Signed   By: Mitzi Hansen M.D.   On: 12/24/2018 22:30   Ct C-spine No Charge  Result Date: 12/24/2018 CLINICAL DATA:  63 y/o M; code stroke, left-sided weakness and  facial droop. Head trauma. EXAM: CT CERVICAL SPINE WITHOUT CONTRAST TECHNIQUE: Multidetector CT imaging of the cervical spine was reconstructed from the CT angiogram of the neck in multiple planes. COMPARISON:  Concurrent CTA of the neck. FINDINGS: Alignment: Straightening of cervical lordosis without listhesis. Skull base and vertebrae: C4-C7 ACDF and interbody fusion. C3-T1 posterior fusion and laminectomy. Streak artifact from hardware partially obscures the spine and spinal canal through the levels of fusion. Hardware appears intact and there is no periprosthetic lucency or fracture identified. Solid osseous fusion of posterolateral elements. Soft tissues and spinal canal: Negative. Disc levels: Uncovertebral and facet hypertrophy results in bony neural foraminal stenosis at the left C2-3, bilateral C3-4, bilateral C4-5, bilateral C5-6, bilateral C6-7 and right C7-T1 levels. The visible spinal canal appears widely patent. Upper chest: Negative. Other: Negative. IMPRESSION: 1. C4-C7 ACDF and C3-T1 posterior fusion with laminectomy. Streak artifact from hardware partially obscures the spine and spinal canal through the levels of fusion. No apparent hardware related complication. 2. No acute fracture or dislocation identified. Electronically Signed   By: Mitzi Hansen M.D.   On: 12/24/2018 22:39   Ct Cerebral Perfusion W Contrast  Result Date: 12/24/2018 CLINICAL DATA:  63 y/o M; left facial droop and left-sided weakness. EXAM: CT ANGIOGRAPHY HEAD AND NECK CT PERFUSION BRAIN TECHNIQUE: Multidetector CT imaging of the head and neck was performed using the standard protocol during bolus administration of intravenous contrast. Multiplanar CT image reconstructions and MIPs were obtained to evaluate the vascular anatomy. Carotid stenosis measurements (when applicable) are obtained utilizing NASCET criteria, using the distal internal carotid diameter as the denominator. Multiphase CT imaging of the brain  was performed following IV bolus contrast injection. Subsequent parametric perfusion maps were calculated using RAPID software. CONTRAST:  OMNIPAQUE IOHEXOL 350 MG/ML SOLN COMPARISON:  12/24/2018 CT head.  05/15/2017 MRI and MRA head. FINDINGS: CTA NECK FINDINGS Aortic arch: Bovine variant branching. Imaged portion shows no evidence of aneurysm or dissection. No significant stenosis of the major arch vessel origins. Right carotid system: No evidence of dissection, stenosis (50% or greater) or occlusion. Left carotid system: No evidence of dissection, stenosis (50% or greater) or occlusion. Vertebral arteries: Left dominant. No evidence of dissection, stenosis (50% or greater) or occlusion. Skeleton: C4-7 ACDF and 3-7 posterior fusion with laminectomy. No acute osseous abnormality identified. Other neck: Negative. Upper chest: Ground-glass opacities in the right upper lung zone, probably atelectasis. Review of the MIP images confirms the above findings CTA HEAD FINDINGS Anterior circulation: No significant stenosis, proximal occlusion, aneurysm, or vascular malformation. Minimal non stenotic calcific atherosclerosis of carotid siphons. Posterior circulation: No significant stenosis, proximal occlusion, aneurysm, or vascular malformation. Venous sinuses: As permitted by contrast timing, patent. Anatomic variants: Diminutive right vertebral artery largely terminates in the right PICA, normal variant. Patent left posterior communicating artery. No right posterior communicating artery or anterior communicating artery identified, likely hypoplastic or absent. Review of the MIP images confirms the above findings CT Brain Perfusion Findings: CBF (<30%) Volume: 109mL Perfusion (Tmax>6.0s) volume: 50mL Mismatch Volume: 24mL Infarction Location:Negative. IMPRESSION: 1. Patent carotid and vertebral arteries. No dissection, aneurysm, or hemodynamically significant stenosis utilizing NASCET criteria. 2. Patent anterior and  posterior intracranial circulation. No large vessel occlusion, aneurysm, or significant stenosis. 3. Normal CT brain perfusion. These results were called  by telephone at the time of interpretation on 12/24/2018 at 10:27 pm to Dr. Ritta Slot , who verbally acknowledged these results. Electronically Signed   By: Mitzi Hansen M.D.   On: 12/24/2018 22:30   Ct Head Code Stroke Wo Contrast  Result Date: 12/24/2018 CLINICAL DATA:  Code stroke. Left facial droop and left-sided weakness. EXAM: CT HEAD WITHOUT CONTRAST TECHNIQUE: Contiguous axial images were obtained from the base of the skull through the vertex without intravenous contrast. COMPARISON:  Head CT 05/05/2018 FINDINGS: Brain: There is no mass, hemorrhage or extra-axial collection. The size and configuration of the ventricles and extra-axial CSF spaces are normal. There is hypoattenuation of the periventricular white matter, most commonly indicating chronic ischemic microangiopathy. Vascular: No abnormal hyperdensity of the major intracranial arteries or dural venous sinuses. No intracranial atherosclerosis. Skull: The visualized skull base, calvarium and extracranial soft tissues are normal. Sinuses/Orbits: No fluid levels or advanced mucosal thickening of the visualized paranasal sinuses. No mastoid or middle ear effusion. The orbits are normal. ASPECTS [facility] Stroke Program Early CT Score) - Ganglionic level infarction (caudate, lentiform nuclei, internal capsule, insula, M1-M3 cortex): 7 - Supraganglionic infarction (M4-M6 cortex): 3 Total score (0-10 with 10 being normal): 10 IMPRESSION: 1. No acute hemorrhage. 2. ASPECTS is 10. * These results were communicated to Dr. Ritta Slot at 9:58 pm on 12/24/2018 by text page via the [facility] messaging system. Electronically Signed   By: Deatra Robinson M.D.   On: 12/24/2018 21:59    PHYSICAL EXAM : Middle-aged African-American male wearing a neck collar. Not in distress. . Afebrile.  Head is nontraumatic. Neck is supple without bruit.    Cardiac exam no murmur or gallop. Lungs are clear to auscultation. Distal pulses are well felt. Neurological Exam :  Awake alert oriented to time place and person. No aphasia or apraxia dysarthria. Follows commands well. Extraocular moments are full range without nystagmus. Blinks to threat bilaterally. Pupils equal reactive. Fundi not visualized. Motor system exam no obvious drift or weakness but poor effort in variable cooperation. Giveaway weakness in the left grip and hand. Similarly some giveaway weakness in the left leg which is not consistent when his distracted. Mild tremor of the outstretched left upper extremity with action only. Diminished subjective touch pinprick sensation in the left lower extremity not on the face or the left upper extremity. Deep tendon reflexes are symmetric. Plantars downgoing. Gait not tested.  ASSESSMENT/PLAN Russell Carter is a 63 y.o. male with history of spinal surgery ( with baseline left side weakness presenting with left sided weakness. Patient had an episode of shaking/ jerking. EEG: was negative for seizure.TPA was given.  TIA:   Code Stroke CT head No acute stroke. No hemorrhage.  Atrophy. ASPECTS 10.   CTA head & neck 1. Patent carotid and vertebral arteries. No dissection, aneurysm,or hemodynamically significant stenosis. Patent anterior and posterior intracranial circulation. No large vessel occlusion, aneurysm, or significant stenosis.  CT perfusion : normal  MRI  pending  2D Echo  pending  LDL pending  HgbA1c 6.3  SCD's for VTE prophylaxis Diet Order            Diet Carb Modified Fluid consistency: Thin; Room service appropriate? Yes  Diet effective now               No antithrombotic prior to admission, now on No antithrombotic.   Therapy recommendations:  pending  Disposition:  pending  Hypertension . Stable Permissive hypertension (OK if < 180/105) but gradually  normalize in 5-7 days . 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  ETOH use, advised to drink no more than 2 drink(s) a day  Obesity, Body mass index is 30.19 kg/m., recommend weight loss, diet and exercise as appropriate   Hx stroke  Coronary artery disease  Migraines  Other Active Problems  Hospital day # 1  Valentina LucksJessica Williams, MSN, NP-C [facility] [phone] I have personally obtained history,examined this patient, reviewed notes, independently viewed imaging studies, participated in medical decision making and plan of care.ROS completed by me personally and pertinent positives fully documented  I have made any additions or clarifications directly to the above note. Agree with note above. He has presented withleft-sided weakness and was given IV TPA but neurological exam is bizarre with the nonorganic features. Recommend close neurological monitoring and strict blood pressure control as per post TPA protocol. Check MRI scan of the brain later. Mobilize out of bed. Therapy consults. Discontinue cervical collar has neck CT shows no fracture. Long discussion with the patient and the bedside and answered questions.This patient is critically ill and at significant risk of neurological worsening, death and care requires constant monitoring of vital signs, hemodynamics,respiratory and cardiac monitoring, extensive review of multiple databases, frequent neurological assessment, discussion with family, other specialists and medical decision making of high complexity.I have made any additions or clarifications directly to the above note.This critical care time does not reflect  procedure time, or teaching time or supervisory time of PA/NP/Med Resident etc but could involve care discussion time.  I spent 30 minutes of neurocritical care time  in the care of  this patient.     Delia HeadyPramod Minka Knight, MD Medical Director [facility] Pager: [phone] 12/25/2018 5:13 PM   To contact Stroke Continuity provider, please refer to [email or web address]. After hours, contact General Neurology

## 2018-12-25 NOTE — Progress Notes (Signed)
  Echocardiogram 2D Echocardiogram has been performed.  Russell Carter 12/25/2018, 2:17 PM

## 2018-12-25 NOTE — Progress Notes (Signed)
PT Cancellation Note  Patient Details Name: Russell Carter MRN: [id number] DOB: 04-08-1956   Cancelled Treatment:    Reason Eval/Treat Not Completed: Active bedrest order   [name] [name] 12/25/2018, 7:19 AM  [name], PT Acute Rehabilitation Services Pager: [phone] Office: [phone]

## 2018-12-26 LAB — GLUCOSE, CAPILLARY
Glucose-Capillary: 122 mg/dL — ABNORMAL HIGH (ref 70–99)
Glucose-Capillary: 113 mg/dL — ABNORMAL HIGH (ref 70–99)

## 2018-12-26 MED ORDER — OXYCODONE-ACETAMINOPHEN 7.5-325 MG PO TABS: 1.0000 | ORAL_TABLET | Freq: Three times a day (TID) | ORAL | 0 refills | Status: AC

## 2018-12-26 MED ORDER — ASPIRIN 81 MG PO TBEC: 81.0000 mg | DELAYED_RELEASE_TABLET | Freq: Every day | ORAL | 1 refills | Status: DC

## 2018-12-26 MED ORDER — ASPIRIN EC 81 MG PO TBEC: 81.0000 mg | DELAYED_RELEASE_TABLET | Freq: Every day | ORAL | 2 refills | Status: AC

## 2018-12-26 MED ORDER — OXYCODONE-ACETAMINOPHEN 7.5-325 MG PO TABS: 1.0000 | ORAL_TABLET | Freq: Three times a day (TID) | ORAL | 0 refills | Status: DC | PRN

## 2018-12-26 MED ORDER — PANTOPRAZOLE SODIUM 40 MG PO TBEC: 40.0000 mg | DELAYED_RELEASE_TABLET | Freq: Every day | ORAL | Status: DC

## 2018-12-26 NOTE — Care Management Note (Signed)
Case Management Note  Patient Details  Name: Russell Carter MRN: [id number] Date of Birth: 1956-02-14  Subjective/Objective: 63 yo with fall at home with left hemiplegia and hemianopia s/p tPA with CT and MRI negative.  PTA, pt independent, lives with fiance and her daughter.                     Action/Plan: PT and ST recommending home health follow up, and pt agreeable to services.  Referral to [facility], per pt choice.  Start of care 24-48h post dc date.  Pt states fiance and family able to provide 24h care at discharge.  Referral to [facility] for DME needs; RW to be delivered to bedside prior to dc.    Expected Discharge Date:  12/26/18               Expected Discharge Plan:  Home w Home Health Services  In-House Referral:     Discharge planning Services  CM Consult  Post Acute Care Choice:  Home Health Choice offered to:  Patient  DME Arranged:  [facility] rolling DME Agency:  [facility]  HH Arranged:  PT, Speech Therapy HH Agency:  [facility]  Status of Service:  Completed, signed off  If discussed at Long Length of Stay Meetings, dates discussed:    Additional Comments:  Quintella Baton, RN, BSN  Trauma/Neuro ICU Case Manager [phone]

## 2018-12-26 NOTE — Progress Notes (Addendum)
Spoke with RN per patient his prescriptions needed to be sent to [facility] on [address]. This was a change from the [facility] [address] listed in the chart.  I sent prescriptions to the correct location which was [facility] on [address]. dc'ed in the system the old prescriptions and called [facility] to have them deactivate the old prescription.

## 2018-12-26 NOTE — Discharge Summary (Addendum)
Stroke Discharge Summary  Patient ID: Russell Carter    l   MRN: [id number]      DOB: 29-Sep-1956  Date of Admission: 12/24/2018 Date of Discharge: 12/26/2018  Attending Physician:  Delia HeadySethi, Pramod, Stroke MD Consultant(s):   Treatment Team:  Stroke, Md, MD None  Patient's PCP:  [name], [name], MD  DISCHARGE DIAGNOSIS:  Left arm weakness Active Problems:   Stroke like episode [facility](HCC)   Past Medical History:  Diagnosis Date  . Acute kidney injury (HCC) 04/27/2018  . AKI (acute kidney injury) (HCC)   . Anginal pain (HCC)   . Anxiety   . Arthritis   . Bipolar disorder (HCC)   . Blood in stool   . CAD (coronary artery disease)    a. 2011: cath showing mod non-obstructive disease b. 02/2016: cath with 95% stenosis in the Mid RCA. DES placed.  . Cervical stenosis of spine   . Chronic anticoagulation 04/27/2018  . Chronic pain disorder 06/05/2017  . CKD (chronic kidney disease), stage III (HCC) 05/06/2018  . Cocaine abuse (HCC)    history  . Complete heart block (HCC) 03/14/2016  . Depression   . Diabetes mellitus type 2 in nonobese (HCC)   . Diabetes mellitus without complication (HCC)   . Dyslipidemia 03/15/2016  . Essential hypertension 05/18/2009   Qualifier: Diagnosis of  By: Kem ParkinsonBarnes, Kimalexis    . Gastroesophageal reflux disease without esophagitis 07/31/2017  . Gout, unspecified   . Headache   . History of CVA (cerebrovascular accident)   . History of pulmonary embolus (PE) 02/14/2017  . HTN (hypertension)   . Hypercholesterolemia   . Hyperglycemia   . Leukopenia 05/06/2018  . Migraines   . Myocardial infarction [facility](HCC) 2008   drug cocaine and marijuana at time of mi none  since    . Normocytic anemia 05/06/2018  . Pneumonia   . Polysubstance abuse (HCC) 01/15/2013  . Rectal bleeding   . Renal insufficiency   . Seizure (HCC) 05/06/2018  . Seizures (HCC)   . Stroke [facility](HCC)    Past Surgical History:  Procedure Laterality Date  . ANTERIOR CERVICAL DECOMP/DISCECTOMY FUSION N/A  01/24/2017   Procedure: Cervical Four-Five,Cervical Five Six,Cervical Six-Seven Anterior cervical discectomy with fusion and plate with Cervical Three-Thoracic- One Laminectomy/Foraminotomy with Cervical Three-Thoracic- One Fixation and fusion;  Surgeon: Loura HaltBenjamin Jared Ditty, MD;  [facility];  Service: Neurosurgery;  Laterality: N/A;  Part-1 anterior approach  . CARDIAC CATHETERIZATION  2008; ~ 2011   [place]-40%lad  . CARDIAC CATHETERIZATION N/A 03/16/2016   Procedure: Left Heart Cath and Coronary Angiography;  Surgeon: Marykay Lexavid W Harding, MD;  [facility];  Service: Cardiovascular;  Laterality: N/A;  . COLONOSCOPY WITH PROPOFOL N/A 04/17/2018   Procedure: COLONOSCOPY WITH PROPOFOL;  Surgeon: Graylin ShiverGanem, Salem F, MD;  [facility];  Service: Endoscopy;  Laterality: N/A;  . CORONARY ANGIOPLASTY     STENTS  . EYE SURGERY Bilateral    "for double vision"  . INGUINAL HERNIA REPAIR  05/12/2012   Procedure: HERNIA REPAIR INGUINAL ADULT;  Surgeon: Liz MaladyBurke E Thompson, MD;  Location: [facility];  Service: General;  Laterality: Right;  . INGUINAL HERNIA REPAIR Left 02/02/2003   Dr. Violeta GelinasBurke Thompson. repair with mesh. Same procedure done again in  05/22/2004   . INGUINAL HERNIA REPAIR Left ?date 2nd OR  . LEFT HEART CATH AND CORONARY ANGIOGRAPHY N/A 04/29/2018   Procedure: LEFT HEART CATH AND CORONARY ANGIOGRAPHY;  Surgeon: SwazilandJordan, Peter M, MD;  [facility];  Service: Cardiovascular;  Laterality: N/A;  . POSTERIOR CERVICAL FUSION/FORAMINOTOMY N/A 01/24/2017   Procedure: Cervical Three-Thoracic One-Laminectomy/Foraminotomy with Cervical Three-Thoracic One Fixation and fusion;  Surgeon: Loura Halt Ditty, MD;  Location: [facility] OR;  Service: Neurosurgery;  Laterality: N/A;  Part-2 Posterior approach  . RECONSTRUCT / STABILIZE DISTAL ULNA    . SHOULDER OPEN ROTATOR CUFF REPAIR Left 2010  . SHOULDER SURGERY Left    "injured on the job; had to cut small bone out"  .  THUMB FUSION Right     Allergies as of 12/26/2018      Reactions   Shellfish Allergy Anaphylaxis      Medication List    STOP taking these medications   acetaminophen 500 MG tablet Commonly known as:  TYLENOL   clotrimazole 1 % cream Commonly known as:  LOTRIMIN   hydrOXYzine 25 MG tablet Commonly known as:  ATARAX/VISTARIL   LORazepam 0.5 MG tablet Commonly known as:  Ativan   meclizine 25 MG tablet Commonly known as:  ANTIVERT   omeprazole 20 MG capsule Commonly known as:  PRILOSEC   warfarin 5 MG tablet Commonly known as:  COUMADIN     TAKE these medications   allopurinol 300 MG tablet Commonly known as:  ZYLOPRIM TAKE 1 TABLET (300 MG TOTAL) BY MOUTH DAILY.   amLODipine 5 MG tablet Commonly known as:  NORVASC Take 1 tablet (5 mg total) by mouth daily.   aspirin 81 MG EC tablet Take 1 tablet (81 mg total) by mouth at bedtime.   Calcium-Vitamin D 600-400 MG-UNIT Tabs Take 1 tablet by mouth daily.   divalproex 500 MG DR tablet Commonly known as:  DEPAKOTE Take 1 tablet (500 mg total) by mouth 2 (two) times daily.   DULoxetine 30 MG capsule Commonly known as:  Cymbalta Take 1 capsule (30 mg total) by mouth 2 (two) times daily.   folic acid 1 MG tablet Commonly known as:  FOLVITE Take 1 tablet (1 mg total) by mouth daily.   gabapentin 400 MG capsule Commonly known as:  NEURONTIN Take 1 capsule (400 mg total) by mouth 2 (two) times daily.   gabapentin 600 MG tablet Commonly known as:  NEURONTIN Take 600 mg by mouth 4 (four) times daily.   losartan 100 MG tablet Commonly known as:  COZAAR Take 1 tablet (100 mg total) by mouth daily.   methocarbamol 750 MG tablet Commonly known as:  ROBAXIN Take 1 tablet (750 mg total) by mouth every 8 (eight) hours as needed for muscle spasms.   metoCLOPramide 10 MG tablet Commonly known as:  REGLAN TAKE 1 TABLET BY MOUTH EVERY 6 HOURS AS NEEDED FOR NAUSEA OR HEADACHE. What changed:  See the new  instructions.   multivitamin with minerals Tabs tablet Take 1 tablet by mouth daily.   nitroGLYCERIN 0.4 MG SL tablet Commonly known as:  NITROSTAT Place 1 tablet (0.4 mg total) under the tongue every 5 (five) minutes as needed for chest pain.   oxyCODONE-acetaminophen 7.5-325 MG tablet Commonly known as:  PERCOCET Take 1 tablet by mouth 3 (three) times daily as needed for moderate pain or severe pain. What changed:    when to take this  reasons to take this   rosuvastatin 40 MG tablet Commonly known as:  CRESTOR Take 1 tablet (40 mg total) by mouth daily.   tamsulosin 0.4 MG Caps capsule Commonly known as:  FLOMAX Take 1 capsule (0.4 mg total) by mouth at bedtime.  traZODone 100 MG tablet Commonly known as:  DESYREL Take 1 tablet (100 mg total) by mouth at bedtime as needed for sleep.       LABORATORY STUDIES CBC    Component Value Date/Time   WBC 4.9 12/25/2018 0801   RBC 4.95 12/25/2018 0801   HGB 14.6 12/25/2018 0801   HGB 14.2 04/07/2018 1431   HCT 43.5 12/25/2018 0801   HCT 43.5 04/07/2018 1431   PLT 187 12/25/2018 0801   PLT 215 04/07/2018 1431   MCV 87.9 12/25/2018 0801   MCV 91 04/07/2018 1431   MCH 29.5 12/25/2018 0801   MCHC 33.6 12/25/2018 0801   RDW 14.4 12/25/2018 0801   RDW 15.5 (H) 04/07/2018 1431   LYMPHSABS 1.8 12/24/2018 2146   LYMPHSABS 2.1 04/07/2018 1431   MONOABS 0.5 12/24/2018 2146   EOSABS 0.0 12/24/2018 2146   EOSABS 0.1 04/07/2018 1431   BASOSABS 0.0 12/24/2018 2146   BASOSABS 0.0 04/07/2018 1431   CMP    Component Value Date/Time   NA 137 12/24/2018 2146   NA 141 06/18/2018 1422   K 4.5 12/24/2018 2146   CL 110 12/24/2018 2146   CO2 17 (L) 12/24/2018 2146   GLUCOSE 112 (H) 12/24/2018 2146   BUN 11 12/24/2018 2146   BUN 13 06/18/2018 1422   CREATININE 1.40 (H) 12/24/2018 2155   CALCIUM 8.9 12/24/2018 2146   PROT 6.3 (L) 12/24/2018 2146   PROT 7.0 06/06/2018 1615   ALBUMIN 3.6 12/24/2018 2146   ALBUMIN 4.3  06/06/2018 1615   AST 55 (H) 12/24/2018 2146   ALT 36 12/24/2018 2146   ALKPHOS 51 12/24/2018 2146   BILITOT 1.6 (H) 12/24/2018 2146   BILITOT <0.2 06/06/2018 1615   GFRNONAA >60 12/24/2018 2146   GFRAA >60 12/24/2018 2146   COAGS Lab Results  Component Value Date   INR 1.4 (H) 12/24/2018   INR 2.6 11/05/2018   INR 2.0 09/23/2018   Lipid Panel    Component Value Date/Time   CHOL 162 12/25/2018 0801   CHOL 165 06/26/2018 0842   TRIG 351 (H) 12/25/2018 0801   HDL 45 12/25/2018 0801   HDL 50 06/26/2018 0842   CHOLHDL 3.6 12/25/2018 0801   VLDL 70 (H) 12/25/2018 0801   LDLCALC 47 12/25/2018 0801   LDLCALC 89 06/26/2018 0842   HgbA1C  Lab Results  Component Value Date   HGBA1C 6.3 (H) 12/25/2018   Urinalysis    Component Value Date/Time   COLORURINE YELLOW 07/30/2018 1659   APPEARANCEUR CLEAR 07/30/2018 1659   LABSPEC 1.012 07/30/2018 1659   PHURINE 6.0 07/30/2018 1659   GLUCOSEU NEGATIVE 07/30/2018 1659   HGBUR NEGATIVE 07/30/2018 1659   BILIRUBINUR negative 09/08/2018 1640   KETONESUR trace (5) (A) 09/08/2018 1640   KETONESUR NEGATIVE 07/30/2018 1659   PROTEINUR NEGATIVE 07/30/2018 1659   UROBILINOGEN 2.0 (A) 09/08/2018 1640   UROBILINOGEN 1.0 02/04/2015 0647   NITRITE Negative 09/08/2018 1640   NITRITE NEGATIVE 07/30/2018 1659   LEUKOCYTESUR Negative 09/08/2018 1640   Urine Drug Screen     Component Value Date/Time   LABOPIA POSITIVE (A) 05/05/2018 2030   COCAINSCRNUR NONE DETECTED 05/05/2018 2030   COCAINSCRNUR NEG 03/24/2012 0947   LABBENZ NONE DETECTED 05/05/2018 2030   LABBENZ NEG 03/24/2012 0947   AMPHETMU NONE DETECTED 05/05/2018 2030   THCU NONE DETECTED 05/05/2018 2030   LABBARB (A) 05/05/2018 2030    Result not available. Reagent lot number recalled by manufacturer.    Alcohol Level  Component Value Date/Time   ETH 11 (H) 05/05/2018 2028     SIGNIFICANT DIAGNOSTIC STUDIES F/u CT 12/25/2018 1. No acute intracranial process. 2. Mild  parenchymal brain volume loss and mild chronic small vessel ischemic changes.    MRI 12/26/2018: 1. No acute intracranial abnormality. 2. Mild chronic small vessel ischemic disease.  CTA/CTP 12/24/2018:  1. Patent carotid and vertebral arteries. No dissection, aneurysm, or hemodynamically significant stenosis utilizing NASCET criteria. 2. Patent anterior and posterior intracranial circulation. No large vessel occlusion, aneurysm, or significant stenosis. 3. Normal CT brain perfusion.  EEG 12/25/2018 Clinical Interpretation: This normal EEG is recorded in the drowsy and sleep state. There was no seizure or seizure predisposition recorded on this study. Please note that lack of epileptiform activity on EEG does not preclude the possibility of epilepsy.    ECHO 12/25/2018 IMPRESSIONS    1. The left ventricle has normal systolic function with an ejection fraction of 60-65%. The cavity size was normal. Mild basal hypertrophy. Left ventricular diastolic Doppler parameters are consistent with impaired relaxation.  2. The right ventricle has normal systolic function. The cavity was normal. There is no increase in right ventricular wall thickness. Right ventricular systolic pressure could not be assessed.  3. The mitral valve is normal in structure. Mild thickening of the mitral valve leaflet.  4. The tricuspid valve is normal in structure.  5. The aortic valve is tricuspid. Severely calcified noncoronary cusp of the aortic valve. Aortic valve regurgitation is mild by color flow Doppler.  6. The pulmonic valve was normal in structure.  HISTORY OF PRESENT ILLNESS [name] is a 62 y.o. male with history of spinal surgery ( with baseline left side weakness presenting with left sided weakness. Patient had an episode of shaking/ jerking. EEG: was negative for seizure.TPA was given. MRI: negative for stroke.   DISCHARGE EXAM Blood pressure 129/73, pulse 70, temperature 97.7 F (36.5 C),  temperature source Oral, resp. rate 13, height 5\' 11"  (1.803 m), weight 98.2 kg, SpO2 99 %.  Physical Exam: Middle-aged African-American male wearing a neck collar. Not in distress. . Afebrile. Head is nontraumatic. Neck is supple without bruit.    Cardiac exam no murmur or gallop. Lungs are clear to auscultation. Distal pulses are well felt. Neurological Exam :  Awake alert oriented to time place and person. No aphasia or apraxia dysarthria. Follows commands well. Extraocular moments are full range without nystagmus. Blinks to threat bilaterally. Pupils equal reactive. Fundi not visualized. Motor system exam no obvious drift or weakness but poor effort in variable cooperation. Giveaway weakness in the left grip and hand. Similarly some giveaway weakness in the left leg which is not consistent when his distracted. Mild tremor of the outstretched left upper extremity with action only. Diminished subjective touch pinprick sensation in the left lower extremity not on the face or the left upper extremity. Deep tendon reflexes are symmetric. Plantars downgoing. Gait not tested.  Discharge Diet    Diet Order            Diet Carb Modified Fluid consistency: Thin; Room service appropriate? Yes  Diet effective now             liquids  DISCHARGE PLAN  Disposition: Home with Home Health PT; continued SLP   aspirin 81 mg daily for secondary stroke prevention.  Ongoing risk factor control by Primary Care Physician at time of discharge  Follow-up Marcine Matar, MD in 2 weeks.   34 minutes were spent preparing  discharge.  Valentina Lucks, MSN, NP-C [facility] [phone] I have personally obtained history,examined this patient, reviewed notes, independently viewed imaging studies, participated in medical decision making and plan of care.ROS completed by me personally and pertinent positives fully documented  I have made any additions or clarifications directly to the above note.  Agree with note above.  The patient presented with strokelike symptoms and was given IV TPA but neurological exam had several nonorganic features and brain imaging was negative for acute stroke. His condition resolved at the time of discharge.  Delia Heady, MD Medical Director [facility] Pager: [phone] 12/26/2018 3:35 PM

## 2018-12-26 NOTE — Evaluation (Signed)
Occupational Therapy Evaluation Patient Details Name: Russell Carter MRN: [id number] DOB: [birth year] Today's Date: 12/26/2018    History of Present Illness 63 yo with fall at home with left hemiplegia and hemianopia s/p tPA with CT and MRI negative. PMhx: polysubstance abuse, seizure, gout, depression, bipolar, HTN, HLD, DM, CAD, cervical laminectomy 2018, C3-T1 fusion, pt noted to have left knee effusion and pain 09/2018   Clinical Impression   This 63 y/o male presents with the above. At baseline pt reports he is mod independent with ADL and functional mobility using SPC. Pt completing functional mobility in room using RW with minA initially progressed to minguard assist. Pt completing toileting and standing grooming ADL with minguard assist, requires minA for LB ADL. Pt reports he will have 24hr assist from fiance after return home for ADL/iADL PRN. He will benefit from continued OT services while in acute setting to maximize his safety and independence with ADL and mobility prior to return home. Will follow.     Follow Up Recommendations  No OT follow up;Supervision/Assistance - 24 hour    Equipment Recommendations  Tub/shower seat           Precautions / Restrictions Precautions Precautions: Fall Restrictions Weight Bearing Restrictions: No      Mobility Bed Mobility Overal bed mobility: Modified Independent             General bed mobility comments: HOB slightly elevated  Transfers Overall transfer level: Needs assistance Equipment used: Rolling walker (2 wheeled) Transfers: Sit to/from Stand Sit to Stand: Min guard         General transfer comment: for general safety and lines; VCs for hand placement, no physical assist required    Balance Overall balance assessment: History of Falls;Needs assistance Sitting-balance support: Feet supported Sitting balance-Leahy Scale: Good     Standing balance support: Bilateral upper extremity supported;No upper extremity  supported;During functional activity Standing balance-Leahy Scale: Fair                             ADL either performed or assessed with clinical judgement   ADL Overall ADL's : Needs assistance/impaired Eating/Feeding: Modified independent;Sitting   Grooming: Wash/dry hands;Min guard;Standing   Upper Body Bathing: Set up;Min guard;Sitting   Lower Body Bathing: Min guard;Sit to/from stand   Upper Body Dressing : Set up;Sitting   Lower Body Dressing: Minimal assistance;Sit to/from stand   Toilet Transfer: Min guard;Minimal assistance;Ambulation;Regular Teacher, adult education Details (indicate cue type and reason): minA initially progressed to [employer]- Architect and Hygiene: Min guard;Sit to/from stand Toileting - Clothing Manipulation Details (indicate cue type and reason): pt able to perform peri-care after BM via lateral leans     Functional mobility during ADLs: Minimal assistance;Min guard;Rolling walker       Vision         Perception     Praxis      Pertinent Vitals/Pain Pain Assessment: Faces Faces Pain Scale: Hurts little more Pain Location: generalized Pain Descriptors / Indicators: Discomfort;Aching Pain Intervention(s): Monitored during session;Repositioned     Hand Dominance     Extremity/Trunk Assessment Upper Extremity Assessment Upper Extremity Assessment: Overall WFL for tasks assessed   Lower Extremity Assessment Lower Extremity Assessment: Defer to PT evaluation       Communication Communication Communication: No difficulties   Cognition Arousal/Alertness: Awake/alert Behavior During Therapy: Flat affect Overall Cognitive Status: Impaired/Different from baseline Area of Impairment: Problem solving;Safety/judgement  Safety/Judgement: Decreased awareness of safety;Decreased awareness of deficits   Problem Solving: Slow processing     General Comments  VSS  throughout; pt fiance present end of session    Exercises     Shoulder Instructions      Home Living Family/patient expects to be discharged to:: Private residence Living Arrangements: Spouse/significant other Available Help at Discharge: Available 24 hours/day;Family Type of Home: House Home Access: Stairs to enter [employer] of Steps: 3 Entrance Stairs-Rails: Right;Left Home Layout: One level     Bathroom Shower/Tub: Chief Strategy Officer: Standard     Home Equipment: Cane - single point   Additional Comments: wears glasses at all times, not currently present      Prior Functioning/Environment Level of Independence: Independent with assistive device(s)        Comments: cane for walking        OT Problem List: Decreased strength;Decreased activity tolerance;Impaired balance (sitting and/or standing);Decreased cognition;Decreased safety awareness;Decreased knowledge of use of DME or AE      OT Treatment/Interventions: Self-care/ADL training;Therapeutic exercise;Neuromuscular education;DME and/or AE instruction;Therapeutic activities;Patient/family education;Balance training    OT Goals(Current goals can be found in the care plan section) Acute Rehab OT Goals Patient Stated Goal: home today OT Goal Formulation: With patient Time For Goal Achievement: 01/09/19 Potential to Achieve Goals: Good  OT Frequency: Min 2X/week   Barriers to D/C:            Co-evaluation              AM-PAC OT "6 Clicks" Daily Activity     Outcome Measure Help from another person eating meals?: None Help from another person taking care of personal grooming?: None Help from another person toileting, which includes using toliet, bedpan, or urinal?: A Little Help from another person bathing (including washing, rinsing, drying)?: A Little Help from another person to put on and taking off regular upper body clothing?: None Help from another person to put on  and taking off regular lower body clothing?: A Little 6 Click Score: 21   End of Session Equipment Utilized During Treatment: Gait belt;Rolling walker Nurse Communication: Mobility status  Activity Tolerance: Patient tolerated treatment well Patient left: in chair;with call bell/phone within reach;with chair alarm set  OT Visit Diagnosis: Unsteadiness on feet (R26.81);Muscle weakness (generalized) (M62.81)                Time: 9169-4503 OT Time Calculation (min): 29 min Charges:  OT General Charges $OT Visit: 1 Visit OT Evaluation $OT Eval Moderate Complexity: 1 Mod OT Treatments $Self Care/Home Management : 8-22 mins  Marcy Siren, OT Supplemental Rehabilitation Services Pager [phone] Office [phone]   Orlando Penner 12/26/2018, 2:14 PM

## 2018-12-29 NOTE — Progress Notes (Addendum)
Late entry for missed modified Rankin score.  Score based on review of medical record.   12/25/18 1600  Modified Rankin (Stroke Patients Only)  Pre-Morbid Rankin Score 1  Modified Rankin 4   Lavona [place], [place]   Acute Rehabilitation Services  Pager [phone] Office [phone] 12/29/2018

## 2018-12-30 ENCOUNTER — Telehealth: Payer: Self-pay | Admitting: Internal Medicine

## 2018-12-30 NOTE — Telephone Encounter (Signed)
New Message   Revonda Standard from [facility] is calling to request orders for once a week for the next 3 weeks,states she will be addressing memory and disartria

## 2018-12-30 NOTE — Telephone Encounter (Signed)
FYI  Returned Revonda Standard from [facility] call and gave verbal orders

## 2019-01-01 ENCOUNTER — Other Ambulatory Visit: Payer: Self-pay | Admitting: Internal Medicine

## 2019-01-01 ENCOUNTER — Telehealth: Payer: Self-pay | Admitting: Internal Medicine

## 2019-01-01 MED FILL — AMLODIPINE BESYLATE 5 MG TA: 5 | 30 days supply | Qty: 30 | Fill #0

## 2019-01-01 MED FILL — hydrOXYzine HCL 25 MG TABS: 25 | 8 days supply | Qty: 30 | Fill #2

## 2019-01-01 MED FILL — LOSARTAN POTASSIUM 100 MG T: 100 | 30 days supply | Qty: 30 | Fill #0

## 2019-01-01 MED FILL — ALLOPURINOL 300 MG TAB: 300 | 30 days supply | Qty: 30 | Fill #2

## 2019-01-01 MED FILL — GABAPENTIN 400 MG CAPSULE: 400 | 30 days supply | Qty: 60 | Fill #5

## 2019-01-01 MED FILL — WARFARIN SODIUM 5 MG TABLET: 5 | 30 days supply | Qty: 30 | Fill #0

## 2019-01-01 MED FILL — OMEPRAZOLE 20 MG CAP: 20 | 30 days supply | Qty: 30 | Fill #3

## 2019-01-01 MED FILL — DIVALPROEX SOD DR 500 MG TA: 500 | 30 days supply | Qty: 60 | Fill #5

## 2019-01-01 MED FILL — TAMSULOSIN HCL 0.4 MG CAP: 0.4 | 30 days supply | Qty: 30 | Fill #2

## 2019-01-01 NOTE — Telephone Encounter (Signed)
Tasia Catchings from [facility] called requesting verbal orders for PT:  1X a week for 2 weeks  2X a week for 3 weeks

## 2019-01-01 NOTE — Telephone Encounter (Signed)
FYI  Returned Naplate from [facility] call and gave verbal orders

## 2019-01-02 ENCOUNTER — Encounter (HOSPITAL_COMMUNITY): Payer: Self-pay

## 2019-01-02 ENCOUNTER — Emergency Department (HOSPITAL_COMMUNITY)
Admission: EM | Admit: 2019-01-02 | Discharge: 2019-01-02 | Disposition: A | Discharge status: Home / Self Care | Payer: Medicaid Other | Attending: Emergency Medicine | Admitting: Emergency Medicine

## 2019-01-02 ENCOUNTER — Emergency Department (HOSPITAL_COMMUNITY): Payer: Medicaid Other

## 2019-01-02 ENCOUNTER — Other Ambulatory Visit: Payer: Self-pay

## 2019-01-02 DIAGNOSIS — Y929 Unspecified place or not applicable: Secondary | ICD-10-CM | POA: Insufficient documentation

## 2019-01-02 DIAGNOSIS — W1830XA Fall on same level, unspecified, initial encounter: Secondary | ICD-10-CM | POA: Diagnosis not present

## 2019-01-02 DIAGNOSIS — N183 Chronic kidney disease, stage 3 (moderate): Secondary | ICD-10-CM | POA: Insufficient documentation

## 2019-01-02 DIAGNOSIS — E1122 Type 2 diabetes mellitus with diabetic chronic kidney disease: Secondary | ICD-10-CM | POA: Diagnosis not present

## 2019-01-02 DIAGNOSIS — S0990XA Unspecified injury of head, initial encounter: Secondary | ICD-10-CM | POA: Diagnosis not present

## 2019-01-02 DIAGNOSIS — Z7982 Long term (current) use of aspirin: Secondary | ICD-10-CM | POA: Diagnosis not present

## 2019-01-02 DIAGNOSIS — Z79899 Other long term (current) drug therapy: Secondary | ICD-10-CM | POA: Insufficient documentation

## 2019-01-02 DIAGNOSIS — I2510 Atherosclerotic heart disease of native coronary artery without angina pectoris: Secondary | ICD-10-CM | POA: Diagnosis not present

## 2019-01-02 DIAGNOSIS — R51 Headache: Secondary | ICD-10-CM | POA: Insufficient documentation

## 2019-01-02 DIAGNOSIS — I129 Hypertensive chronic kidney disease with stage 1 through stage 4 chronic kidney disease, or unspecified chronic kidney disease: Secondary | ICD-10-CM | POA: Insufficient documentation

## 2019-01-02 DIAGNOSIS — Z7901 Long term (current) use of anticoagulants: Secondary | ICD-10-CM | POA: Diagnosis not present

## 2019-01-02 DIAGNOSIS — Y939 Activity, unspecified: Secondary | ICD-10-CM | POA: Diagnosis not present

## 2019-01-02 DIAGNOSIS — W19XXXA Unspecified fall, initial encounter: Secondary | ICD-10-CM

## 2019-01-02 DIAGNOSIS — Y999 Unspecified external cause status: Secondary | ICD-10-CM | POA: Diagnosis not present

## 2019-01-02 LAB — CBC
HCT: 49.1 % (ref 39.0–52.0)
HEMOGLOBIN: 16 g/dL (ref 13.0–17.0)
MCH: 29.1 pg (ref 26.0–34.0)
MCHC: 32.6 g/dL (ref 30.0–36.0)
MCV: 89.4 fL (ref 80.0–100.0)
Platelets: 208 10*3/uL (ref 150–400)
RBC: 5.49 MIL/uL (ref 4.22–5.81)
RDW: 14.1 % (ref 11.5–15.5)
WBC: 5.2 10*3/uL (ref 4.0–10.5)
nRBC: 0 % (ref 0.0–0.2)

## 2019-01-02 LAB — BASIC METABOLIC PANEL
Anion gap: 9 (ref 5–15)
BUN: 13 mg/dL (ref 8–23)
CO2: 24 mmol/L (ref 22–32)
Calcium: 9.1 mg/dL (ref 8.9–10.3)
Chloride: 105 mmol/L (ref 98–111)
Creatinine, Ser: 1.54 mg/dL — ABNORMAL HIGH (ref 0.61–1.24)
GFR calc Af Amer: 55 mL/min — ABNORMAL LOW (ref >60)
GFR calc non Af Amer: 47 mL/min — ABNORMAL LOW (ref >60)
GLUCOSE: 104 mg/dL — AB (ref 70–99)
POTASSIUM: 4.1 mmol/L (ref 3.5–5.1)
Sodium: 138 mmol/L (ref 135–145)

## 2019-01-02 LAB — PROTIME-INR
INR: 1.7 — ABNORMAL HIGH (ref 0.8–1.2)
Prothrombin Time: 19.8 seconds — ABNORMAL HIGH (ref 11.4–15.2)

## 2019-01-02 MED ORDER — FENTANYL CITRATE (PF) 100 MCG/2ML IJ SOLN
50.0000 ug | Freq: Once | INTRAMUSCULAR | Status: AC
  Administered 2019-01-02: 50 ug via INTRAVENOUS
  Filled 2019-01-02: qty 2

## 2019-01-02 MED ORDER — DIPHENHYDRAMINE HCL 50 MG/ML IJ SOLN
25.0000 mg | Freq: Once | INTRAMUSCULAR | Status: AC
  Administered 2019-01-02: 25 mg via INTRAVENOUS
  Filled 2019-01-02: qty 1

## 2019-01-02 MED ORDER — PROCHLORPERAZINE EDISYLATE 10 MG/2ML IJ SOLN
10.0000 mg | Freq: Once | INTRAMUSCULAR | Status: AC
  Administered 2019-01-02: 10 mg via INTRAVENOUS
  Filled 2019-01-02: qty 2

## 2019-01-02 MED ORDER — SODIUM CHLORIDE 0.9 % IV BOLUS
500.0000 mL | Freq: Once | INTRAVENOUS | Status: AC
  Administered 2019-01-02: 500 mL via INTRAVENOUS

## 2019-01-02 NOTE — ED Triage Notes (Signed)
Here to [facility] ED via [facility] after fall on thinners yesterday from porch to concrete and hit back of head. Recently DC'd this past week with CVA on 3/6. C/O neck pain, headache, visual disturbances/blurry, dizziness, Left leg difficulties. A/O x 4.  [facility] vitals on arrival 136/82 84 sr 22 rr 96 ra 147 cbg

## 2019-01-02 NOTE — Discharge Instructions (Addendum)
Work-up today is reassuring, CT shows no evidence of bleeding or injury to the head, CT of the neck is normal as well.  Labs overall look good and your INR is at the appropriate level with your warfarin.  Please follow-up with your primary care doctor and neurologist, continue with home PT and OT.  Return to the emergency department if you have any additional falls or head injury or any new weakness or numbness.

## 2019-01-02 NOTE — ED Provider Notes (Signed)
[facility] EMERGENCY DEPARTMENT Provider Note   CSN: [id number] Arrival date & time: 01/02/19  1402    History   Chief Complaint Chief Complaint  Patient presents with   Fall    HPI Russell Carter is a 63 y.o. male.     Russell Carter is a 63 y.o. male with history of recent stroke on 3/4 for which pt received TPA, HTN, HLD, CAD, CKD, DM, and migraines, who presents for evaluation after fall with head injury. Pt reports last night he slipped and fell striking the back of his head on concrete. He denies any associated loss of consciousness. He reports headache.  Patient notes that he has had a headache since discharge from hospital on 3/6, but headache worsened after fall.  He reports after stroke he has had left-sided weakness and visual changes and these are unchanged he denies any new neurologic symptoms or changes.  No nausea or vomiting.  No dizziness.  He also reports some pain in his neck.  Denies any pain elsewhere from the fall.  No chest pain or abdominal pain, no pain in his extremities.  Did not have syncopal episode leading to fall.  Is currently on Coumadin and has been taking regularly.     Past Medical History:  Diagnosis Date   Acute kidney injury (HCC) 04/27/2018   AKI (acute kidney injury) (HCC)    Anginal pain (HCC)    Anxiety    Arthritis    Bipolar disorder (HCC)    Blood in stool    CAD (coronary artery disease)    a. 2011: cath showing mod non-obstructive disease b. 02/2016: cath with 95% stenosis in the Mid RCA. DES placed.   Cervical stenosis of spine    Chronic anticoagulation 04/27/2018   Chronic pain disorder 06/05/2017   CKD (chronic kidney disease), stage III (HCC) 05/06/2018   Cocaine abuse (HCC)    history   Complete heart block (HCC) 03/14/2016   Depression    Diabetes mellitus type 2 in nonobese [facility])    Diabetes mellitus without complication (HCC)    Dyslipidemia 03/15/2016   Essential hypertension  05/18/2009   Qualifier: Diagnosis of  By: Kem Parkinson     Gastroesophageal reflux disease without esophagitis 07/31/2017   Gout, unspecified    Headache    History of CVA (cerebrovascular accident)    History of pulmonary embolus (PE) 02/14/2017   HTN (hypertension)    Hypercholesterolemia    Hyperglycemia    Leukopenia 05/06/2018   Migraines    Myocardial infarction (HCC) 2008   drug cocaine and marijuana at time of mi none  since     Normocytic anemia 05/06/2018   Pneumonia    Polysubstance abuse (HCC) 01/15/2013   Rectal bleeding    Renal insufficiency    Seizure (HCC) 05/06/2018   Seizures (HCC)    Stroke [facility])     Patient Active Problem List   Diagnosis Date Noted   Stroke (HCC) 12/24/2018   CKD (chronic kidney disease), stage III (HCC) 05/06/2018   Seizure (HCC) 05/06/2018   Normocytic anemia 05/06/2018   Leukopenia 05/06/2018   AKI (acute kidney injury) (HCC)    Acute kidney injury (HCC) 04/27/2018   Abdominal pain 04/27/2018   Chronic anticoagulation 04/27/2018   Left knee pain 01/10/2018   Numbness and tingling in both hands 07/31/2017   Gastroesophageal reflux disease without esophagitis 07/31/2017   Chronic pain disorder 06/05/2017   History of pulmonary embolus (PE) 02/14/2017   Diabetes  mellitus type 2 in nonobese [facility])    Cocaine abuse (HCC)    Depression    History of CVA (cerebrovascular accident)    Cervical spondylosis with radiculopathy 01/24/2017   Dyslipidemia 03/15/2016   Complete heart block (HCC) 03/14/2016   Cervical spinal stenosis 03/14/2016   CAD-50% LAD 2011 12/14/2014   Gout 11/16/2014   Polysubstance abuse (HCC) 01/15/2013   DIZZINESS 06/05/2010   Essential hypertension 05/18/2009    Past Surgical History:  Procedure Laterality Date   ANTERIOR CERVICAL DECOMP/DISCECTOMY FUSION N/A 01/24/2017   Procedure: Cervical Four-Five,Cervical Five Six,Cervical Six-Seven Anterior cervical  discectomy with fusion and plate with Cervical Three-Thoracic- One Laminectomy/Foraminotomy with Cervical Three-Thoracic- One Fixation and fusion;  Surgeon: Loura Halt Ditty, MD;  Location: [facility] OR;  Service: Neurosurgery;  Laterality: N/A;  Part-1 anterior approach   CARDIAC CATHETERIZATION  2008; ~ 2011   [place]-40%lad   CARDIAC CATHETERIZATION N/A 03/16/2016   Procedure: Left Heart Cath and Coronary Angiography;  Surgeon: Marykay Lex, MD;  Location: [facility] INVASIVE CV LAB;  Service: Cardiovascular;  Laterality: N/A;   COLONOSCOPY WITH PROPOFOL N/A 04/17/2018   Procedure: COLONOSCOPY WITH PROPOFOL;  Surgeon: Graylin Shiver, MD;  Location: [facility] ENDOSCOPY;  Service: Endoscopy;  Laterality: N/A;   CORONARY ANGIOPLASTY     STENTS   EYE SURGERY Bilateral    "for double vision"   INGUINAL HERNIA REPAIR  05/12/2012   Procedure: HERNIA REPAIR INGUINAL ADULT;  Surgeon: Liz Malady, MD;  Location: [facility];  Service: General;  Laterality: Right;   INGUINAL HERNIA REPAIR Left 02/02/2003   Dr. Violeta Gelinas. repair with mesh. Same procedure done again in  05/22/2004    INGUINAL HERNIA REPAIR Left ?date 2nd OR   LEFT HEART CATH AND CORONARY ANGIOGRAPHY N/A 04/29/2018   Procedure: LEFT HEART CATH AND CORONARY ANGIOGRAPHY;  Surgeon: Swaziland, Peter M, MD;  Location: [place] INVASIVE CV LAB;  Service: Cardiovascular;  Laterality: N/A;   POSTERIOR CERVICAL FUSION/FORAMINOTOMY N/A 01/24/2017   Procedure: Cervical Three-Thoracic One-Laminectomy/Foraminotomy with Cervical Three-Thoracic One Fixation and fusion;  Surgeon: Loura Halt Ditty, MD;  Location: [facility] OR;  Service: Neurosurgery;  Laterality: N/A;  Part-2 Posterior approach   RECONSTRUCT / STABILIZE DISTAL ULNA     SHOULDER OPEN ROTATOR CUFF REPAIR Left 2010   SHOULDER SURGERY Left    "injured on the job; had to cut small bone out"   THUMB FUSION Right         Home Medications    Prior to Admission medications   Medication  Sig Start Date End Date Taking? Authorizing Provider  allopurinol (ZYLOPRIM) 300 MG tablet TAKE 1 TABLET (300 MG TOTAL) BY MOUTH DAILY. 09/22/18   Marcine Matar, MD  amLODipine (NORVASC) 5 MG tablet Take 1 tablet (5 mg total) by mouth daily. 12/18/18   Marcine Matar, MD  aspirin EC 81 MG tablet Take 1 tablet (81 mg total) by mouth daily. 12/26/18 12/26/19  Arline Asp, NP  Calcium Carb-Cholecalciferol (CALCIUM-VITAMIN D) 600-400 MG-UNIT TABS Take 1 tablet by mouth daily. 05/07/18   Shon Hale, MD  divalproex (DEPAKOTE) 500 MG DR tablet Take 1 tablet (500 mg total) by mouth 2 (two) times daily. 05/07/18   Shon Hale, MD  DULoxetine (CYMBALTA) 30 MG capsule Take 1 capsule (30 mg total) by mouth 2 (two) times daily. 05/07/18   Shon Hale, MD  folic acid (FOLVITE) 1 MG tablet Take 1 tablet (1 mg total) by mouth daily. 05/07/18   Emokpae, Courage,  MD  gabapentin (NEURONTIN) 400 MG capsule Take 1 capsule (400 mg total) by mouth 2 (two) times daily. 05/07/18   Shon HaleEmokpae, Courage, MD  gabapentin (NEURONTIN) 600 MG tablet Take 600 mg by mouth 4 (four) times daily. 07/24/18   [provider]  losartan (COZAAR) 100 MG tablet Take 1 tablet (100 mg total) by mouth daily. 12/18/18   [name], [name], MD  methocarbamol (ROBAXIN) 750 MG tablet Take 1 tablet (750 mg total) by mouth every 8 (eight) hours as needed for muscle spasms. 05/07/18   Shon HaleEmokpae, Courage, MD  metoCLOPramide (REGLAN) 10 MG tablet TAKE 1 TABLET BY MOUTH EVERY 6 HOURS AS NEEDED FOR NAUSEA OR HEADACHE. Patient taking differently: Take 10 mg by mouth every 6 (six) hours as needed for nausea (headache).  06/13/18   [name], [name], MD  Multiple Vitamin (MULTIVITAMIN WITH MINERALS) TABS tablet Take 1 tablet by mouth daily. 05/07/18   Shon HaleEmokpae, Courage, MD  nitroGLYCERIN (NITROSTAT) 0.4 MG SL tablet Place 1 tablet (0.4 mg total) under the tongue every 5 (five) minutes as needed for chest pain. 05/02/17   Quentin AngstJegede, Olugbemiga  E, MD  oxyCODONE-acetaminophen (PERCOCET) 7.5-325 MG tablet Take 1 tablet by mouth 3 (three) times daily. 12/26/18 12/26/19  Arline AspWilliams, Jessica N, NP  rosuvastatin (CRESTOR) 40 MG tablet Take 1 tablet (40 mg total) by mouth daily. 05/07/18   Shon HaleEmokpae, Courage, MD  tamsulosin (FLOMAX) 0.4 MG CAPS capsule Take 1 capsule (0.4 mg total) by mouth at bedtime. 09/08/18   [name], [name], MD  traZODone (DESYREL) 100 MG tablet Take 1 tablet (100 mg total) by mouth at bedtime as needed for sleep. 05/07/18   Shon HaleEmokpae, Courage, MD    Family History Family History  Problem Relation Age of Onset   Heart attack Mother        x7. Still alive   Heart attack Father        deceased b/c of MI   Heart attack Brother        older, deceased b/c of MI    Cancer Brother        bone cancer in 1 brother, ? brain cancer in other brother   Heart attack Brother    Other Brother        MVA   Other Brother    Bone cancer Brother    Cancer Brother    SIDS Sister     Social History Social History   Tobacco Use   Smoking status: Never Smoker   Smokeless tobacco: Never Used  Substance Use Topics   Alcohol use: Yes    Comment: 16 oz beer every 2-3 days   Drug use: Yes    Types: "Crack" cocaine, Marijuana, Cocaine    Comment:  "quit 2008"     Allergies   Shellfish allergy   Review of Systems Review of Systems  Constitutional: Negative for chills and fever.  HENT: Negative.   Eyes: Positive for visual disturbance (Unchanged since stroke).  Respiratory: Negative for shortness of breath.   Cardiovascular: Negative for chest pain.  Gastrointestinal: Negative for abdominal pain, nausea and vomiting.  Genitourinary: Negative for dysuria and flank pain.  Musculoskeletal: Positive for neck pain. Negative for arthralgias, back pain and joint swelling.  Skin: Negative for color change, rash and wound.  Neurological: Positive for weakness (L sided weakness unchanged from recent stroke) and headaches.  Negative for dizziness, syncope, facial asymmetry, light-headedness and numbness.     Physical Exam Updated Vital Signs BP (!) 141/91 (  BP Location: Right Arm)    Pulse 84    Temp 97.9 F (36.6 C) (Oral)    Resp 14    Ht  (1.803 m)    Wt 98 kg    SpO2 100%    BMI 30.13 kg/m   Physical Exam Vitals signs and nursing note reviewed.  Constitutional:      General: He is not in acute distress.    Appearance: Normal appearance. He is well-developed. He is not ill-appearing or diaphoretic.     Comments: C-Collar in place, pt in no acute distress  HENT:     Head: Normocephalic.     Comments: Scalp without signs of trauma, no palpable hematoma, no step-off, negative battle sign, no evidence of hemotympanum or CSF otorrhea     Right Ear: Tympanic membrane and ear canal normal.     Left Ear: Tympanic membrane and ear canal normal.     Nose: Nose normal.     Mouth/Throat:     Mouth: Mucous membranes are moist.     Pharynx: Oropharynx is clear.  Eyes:     General:        Right eye: No discharge.        Left eye: No discharge.     Extraocular Movements: Extraocular movements intact.     Pupils: Pupils are equal, round, and reactive to light.  Neck:     Musculoskeletal: Neck supple.     Comments: Mild tenderness over c-spine, no palpable deformity or crepitus, no overlying skin changes Cardiovascular:     Rate and Rhythm: Normal rate and regular rhythm.     Heart sounds: Normal heart sounds.  Pulmonary:     Effort: Pulmonary effort is normal. No respiratory distress.     Breath sounds: Normal breath sounds. No wheezing or rales.     Comments: Respirations equal and unlabored, patient able to speak in full sentences, lungs clear to auscultation bilaterally Abdominal:     General: Bowel sounds are normal. There is no distension.     Palpations: Abdomen is soft. There is no mass.     Tenderness: There is no abdominal tenderness. There is no guarding.     Comments: Abdomen soft,  nondistended, nontender to palpation in all quadrants without guarding or peritoneal signs  Musculoskeletal:        General: No deformity.  Skin:    General: Skin is warm and dry.     Capillary Refill: Capillary refill takes less than 2 seconds.  Neurological:     Mental Status: He is alert.     Coordination: Coordination normal.     Comments: Speech is clear, able to follow commands CN III-XII intact Normal strength in upper and lower extremities on right including dorsiflexion and plantar flexion, strength 3/5 in leg upper and lower extremities Sensation intact on right pt reports sensation decreased on left lower extremity Moves extremities without ataxia, coordination intact   Psychiatric:        Mood and Affect: Mood normal.        Behavior: Behavior normal.      ED Treatments / Results  Labs (all labs ordered are listed, but only abnormal results are displayed) Labs Reviewed  BASIC METABOLIC PANEL - Abnormal; Notable for the following components:      Result Value   Glucose, Bld 104 (*)    Creatinine, Ser 1.54 (*)    GFR calc non Af Amer 47 (*)    GFR calc Af Denyse Dago  55 (*)    All other components within normal limits  PROTIME-INR - Abnormal; Notable for the following components:   Prothrombin Time 19.8 (*)    INR 1.7 (*)    All other components within normal limits  CBC    EKG EKG Interpretation  Date/Time:  Friday January 02 2019 14:05:45 EDT Ventricular Rate:  82 PR Interval:    QRS Duration: 88 QT Interval:  376 QTC Calculation: 440 R Axis:   27 Text Interpretation:  Sinus rhythm No significant change since last tracing Confirmed by Cathren Laine ([id number]) on 01/02/2019 2:44:38 PM   Radiology Ct Head Wo Contrast  Result Date: 01/02/2019 CLINICAL DATA:  Trauma EXAM: CT HEAD WITHOUT CONTRAST CT CERVICAL SPINE WITHOUT CONTRAST TECHNIQUE: Multidetector CT imaging of the head and cervical spine was performed following the standard protocol without intravenous  contrast. Multiplanar CT image reconstructions of the cervical spine were also generated. COMPARISON:  12/24/2018 FINDINGS: CT HEAD FINDINGS Brain: No evidence of acute infarction, hemorrhage, hydrocephalus, extra-axial collection or mass lesion/mass effect. Periventricular white matter hypodensity. Vascular: No hyperdense vessel or unexpected calcification. Skull: Normal. Negative for fracture or focal lesion. Sinuses/Orbits: No acute finding. Other: None. CT CERVICAL SPINE FINDINGS Alignment: Normal. Skull base and vertebrae: No acute fracture. No primary bone lesion or focal pathologic process. Soft tissues and spinal canal: No prevertebral fluid or swelling. No visible canal hematoma. Disc levels: Anterior and posterior cervical discectomy infusion with laminectomy from C4 through C7. Upper chest: Negative. Other: None. IMPRESSION: 1. No evidence of acute intracranial abnormality. Small-vessel white matter disease. 2. No evidence of acute cervical spine fracture or subluxation. Cervical discectomy and fusion from C4 through C7. Electronically Signed   By: Lauralyn Primes M.D.   On: 01/02/2019 14:58   Ct Cervical Spine Wo Contrast  Result Date: 01/02/2019 CLINICAL DATA:  Trauma EXAM: CT HEAD WITHOUT CONTRAST CT CERVICAL SPINE WITHOUT CONTRAST TECHNIQUE: Multidetector CT imaging of the head and cervical spine was performed following the standard protocol without intravenous contrast. Multiplanar CT image reconstructions of the cervical spine were also generated. COMPARISON:  12/24/2018 FINDINGS: CT HEAD FINDINGS Brain: No evidence of acute infarction, hemorrhage, hydrocephalus, extra-axial collection or mass lesion/mass effect. Periventricular white matter hypodensity. Vascular: No hyperdense vessel or unexpected calcification. Skull: Normal. Negative for fracture or focal lesion. Sinuses/Orbits: No acute finding. Other: None. CT CERVICAL SPINE FINDINGS Alignment: Normal. Skull base and vertebrae: No acute  fracture. No primary bone lesion or focal pathologic process. Soft tissues and spinal canal: No prevertebral fluid or swelling. No visible canal hematoma. Disc levels: Anterior and posterior cervical discectomy infusion with laminectomy from C4 through C7. Upper chest: Negative. Other: None. IMPRESSION: 1. No evidence of acute intracranial abnormality. Small-vessel white matter disease. 2. No evidence of acute cervical spine fracture or subluxation. Cervical discectomy and fusion from C4 through C7. Electronically Signed   By: Lauralyn Primes M.D.   On: 01/02/2019 14:58    Procedures Procedures (including critical care time)  Medications Ordered in ED Medications  fentaNYL (SUBLIMAZE) injection 50 mcg (50 mcg Intravenous Given 01/02/19 1526)  sodium chloride 0.9 % bolus 500 mL (0 mLs Intravenous Stopped 01/02/19 1605)  prochlorperazine (COMPAZINE) injection 10 mg (10 mg Intravenous Given 01/02/19 1524)  diphenhydrAMINE (BENADRYL) injection 25 mg (25 mg Intravenous Given 01/02/19 1523)     Initial Impression / Assessment and Plan / ED Course  I have reviewed the triage vital signs and the nursing notes.  Pertinent labs & imaging results that were  available during my care of the patient were reviewed by me and considered in my medical decision making (see chart for details).  Patient with recent stroke, presents for evaluation after a fall where he hit his head yesterday evening.  No loss of consciousness, but complaining of headache.  No nausea or vomiting.  Patient was admitted with stroke last week and given TPA.  Patient has also on Coumadin.  On arrival neurologic exam consistent with exam from discharge on 3/6 with some left-sided weakness and decreased sensation in the left lower extremity but no new neurologic deficits noted.  Patient also with some tenderness over the C-spine, cannot cleared Via Nexus criteria.  Will check CT head and cervical spine.  Given patient is also on Coumadin will check  basic labs and INR.  Will give headache cocktail for discomfort.  Labs overall reassuring, INR 1.7, at appropriate therapeutic level.  No leukocytosis, normal hemoglobin, no acute electrolyte derangements.  Creatinine slightly increased from baseline at 1.54 today, patient appears slightly dehydrated, given small amount of fluid with headache cocktail.  CT head shows no evidence of intracranial bleeding or other injury, CT C-spine shows no evidence of acute traumatic fracture or malalignment.  Imaging reviewed by myself.  On reevaluation patient reports headache has resolved.  Neurologic exam remained stable.  At this time patient is stable for discharge home with follow-up with his neurologist and continued home health and PT/OT at home for stroke recovery.  Return precautions discussed.  Patient expresses understanding and agreement with this plan.  Final Clinical Impressions(s) / ED Diagnoses   Final diagnoses:  Fall, initial encounter  Injury of head, initial encounter  Warfarin anticoagulation    ED Discharge Orders    None       Dartha Lodge, [place] 01/07/19 1434    Cathren Laine, MD 01/10/19 1431

## 2019-01-05 ENCOUNTER — Other Ambulatory Visit: Payer: Self-pay

## 2019-01-05 NOTE — Patient Outreach (Signed)
[facility]) Care Management  01/05/2019  Jamonte Sirico 08/29/1956 [id number]    EMMI-Stroke-Not on APL RED ON EMMI ALERT Day # 3 Date: 01/02/2019 Red Alert Reason: " New problems walking/talking/speaking/seeing? Yes"   Outreach attempt # 1to patient. No answer. RN CM left HIPAA compliant voicemail message along with contact info.     Plan: RN CM will make outreach attempt to patient within 3-4 business days.  Antionette Fairy, RN,BSN,CCM [facility] Care Management Telephonic Care Management Coordinator Direct Phone: [phone] Toll Free: [phone] Fax: [phone]

## 2019-01-06 ENCOUNTER — Other Ambulatory Visit: Payer: Self-pay

## 2019-01-06 ENCOUNTER — Telehealth: Payer: Self-pay | Admitting: Internal Medicine

## 2019-01-06 NOTE — Patient Outreach (Signed)
[facility]) Care Management  01/06/2019  Qadry Mccaslin [birth year] [id number]   EMMI-Stroke-Not on APL RED ON EMMI ALERT Day # 3 Date: 01/02/2019 Red Alert Reason: " New problems walking/talking/speaking/seeing? Yes"   Outreach attempt #2 to patient. Spoke with patient. He denies any acute issues or concerns at this time. He voices that he is doing and feeling well. RN CM reviewed and addressed red alert with patient. He denies any of those symptoms. He states he did not feel well over the weekend but attributes it to his BP.He voices that he went to the ED to get checked out and symptoms have since resolved. He goes for MD follow up appt on 01/20/19. He denies any issues with transportation. RN CM confirmed with patient that he has all his meds in the home and no issues or concerns regarding them.    Plan: RN CM will close case as no further interventions needed at this time.   Antionette Fairy, RN,BSN,CCM [facility] Care Management Telephonic Care Management Coordinator Direct Phone: [phone] Toll Free: [phone] Fax: [phone]

## 2019-01-06 NOTE — Telephone Encounter (Signed)
Patient called and stated that he was having sharp headaches and when he was having tremors. I spoke to the CMA jaya and she told me to send him to the ED

## 2019-01-13 ENCOUNTER — Other Ambulatory Visit: Payer: Self-pay

## 2019-01-13 NOTE — Patient Outreach (Signed)
[facility]) Care Management  01/13/2019  Russell Carter 1956-06-18 [id number]    EMMI-STROKE-Not on APL RED ON EMMI ALERT Day #13 Date: 01/12/2019 Red Alert Reason: Feeling worse overall? No Went to follow-up appt? No"    Outreach attempt #1 to patient. Spoke with patient who voices he is doing well. He denies any acute issues or concerns at this time. Reviewed and addressed red alerts with patient. He voices error in response. He denies feeling worse and states that he has been feeling good. Patient goes for MD follow up appt on 01/20/2019. No issues with transportation. Patient inquiring about refill on pain med and instructed to follow up with MD regarding the matter. No further RN CM needs or concerns at this time. Patient has completed automated discharge EMMI-Stroke calls.     Plan: RN CM will close case at this time.   Antionette Fairy, RN,BSN,CCM [facility] Care Management Telephonic Care Management Coordinator Direct Phone: [phone] Toll Free: [phone] Fax: [phone]

## 2019-01-14 ENCOUNTER — Telehealth: Payer: Self-pay | Admitting: Internal Medicine

## 2019-01-14 NOTE — Telephone Encounter (Signed)
Patient was seen by PT and was adviesed to go to the hospital bp was 150/98 and he had chest pain. Patient said he wasn't going.

## 2019-01-20 ENCOUNTER — Encounter: Payer: Self-pay | Admitting: Internal Medicine

## 2019-01-20 ENCOUNTER — Other Ambulatory Visit: Payer: Self-pay | Admitting: Pharmacist

## 2019-01-20 ENCOUNTER — Other Ambulatory Visit: Payer: Self-pay

## 2019-01-20 ENCOUNTER — Ambulatory Visit: Payer: Medicaid Other | Attending: Internal Medicine | Admitting: Internal Medicine

## 2019-01-20 VITALS — BP 124/74 | HR 84 | Temp 97.7°F | Resp 16 | Wt 217.2 lb

## 2019-01-20 DIAGNOSIS — I129 Hypertensive chronic kidney disease with stage 1 through stage 4 chronic kidney disease, or unspecified chronic kidney disease: Secondary | ICD-10-CM | POA: Diagnosis not present

## 2019-01-20 DIAGNOSIS — R569 Unspecified convulsions: Secondary | ICD-10-CM | POA: Diagnosis not present

## 2019-01-20 DIAGNOSIS — F329 Major depressive disorder, single episode, unspecified: Secondary | ICD-10-CM | POA: Insufficient documentation

## 2019-01-20 DIAGNOSIS — I2510 Atherosclerotic heart disease of native coronary artery without angina pectoris: Secondary | ICD-10-CM | POA: Diagnosis not present

## 2019-01-20 DIAGNOSIS — Z7982 Long term (current) use of aspirin: Secondary | ICD-10-CM | POA: Diagnosis not present

## 2019-01-20 DIAGNOSIS — M5412 Radiculopathy, cervical region: Secondary | ICD-10-CM

## 2019-01-20 DIAGNOSIS — M25529 Pain in unspecified elbow: Secondary | ICD-10-CM | POA: Diagnosis not present

## 2019-01-20 DIAGNOSIS — E1122 Type 2 diabetes mellitus with diabetic chronic kidney disease: Secondary | ICD-10-CM | POA: Insufficient documentation

## 2019-01-20 DIAGNOSIS — Z8673 Personal history of transient ischemic attack (TIA), and cerebral infarction without residual deficits: Secondary | ICD-10-CM | POA: Diagnosis not present

## 2019-01-20 DIAGNOSIS — F32A Depression, unspecified: Secondary | ICD-10-CM

## 2019-01-20 DIAGNOSIS — K219 Gastro-esophageal reflux disease without esophagitis: Secondary | ICD-10-CM | POA: Insufficient documentation

## 2019-01-20 DIAGNOSIS — N179 Acute kidney failure, unspecified: Secondary | ICD-10-CM

## 2019-01-20 DIAGNOSIS — M4802 Spinal stenosis, cervical region: Secondary | ICD-10-CM | POA: Diagnosis not present

## 2019-01-20 DIAGNOSIS — Z7901 Long term (current) use of anticoagulants: Secondary | ICD-10-CM | POA: Diagnosis not present

## 2019-01-20 DIAGNOSIS — I639 Cerebral infarction, unspecified: Secondary | ICD-10-CM | POA: Diagnosis not present

## 2019-01-20 DIAGNOSIS — R945 Abnormal results of liver function studies: Secondary | ICD-10-CM | POA: Diagnosis not present

## 2019-01-20 DIAGNOSIS — R7989 Other specified abnormal findings of blood chemistry: Secondary | ICD-10-CM

## 2019-01-20 DIAGNOSIS — Z5181 Encounter for therapeutic drug level monitoring: Secondary | ICD-10-CM | POA: Diagnosis not present

## 2019-01-20 DIAGNOSIS — D649 Anemia, unspecified: Secondary | ICD-10-CM | POA: Insufficient documentation

## 2019-01-20 DIAGNOSIS — Z09 Encounter for follow-up examination after completed treatment for conditions other than malignant neoplasm: Secondary | ICD-10-CM | POA: Diagnosis not present

## 2019-01-20 DIAGNOSIS — N183 Chronic kidney disease, stage 3 (moderate): Secondary | ICD-10-CM | POA: Insufficient documentation

## 2019-01-20 DIAGNOSIS — M109 Gout, unspecified: Secondary | ICD-10-CM | POA: Insufficient documentation

## 2019-01-20 DIAGNOSIS — F419 Anxiety disorder, unspecified: Secondary | ICD-10-CM | POA: Diagnosis not present

## 2019-01-20 DIAGNOSIS — R531 Weakness: Secondary | ICD-10-CM | POA: Diagnosis not present

## 2019-01-20 DIAGNOSIS — Z86711 Personal history of pulmonary embolism: Secondary | ICD-10-CM | POA: Diagnosis not present

## 2019-01-20 DIAGNOSIS — R2990 Unspecified symptoms and signs involving the nervous system: Secondary | ICD-10-CM

## 2019-01-20 DIAGNOSIS — I10 Essential (primary) hypertension: Secondary | ICD-10-CM

## 2019-01-20 DIAGNOSIS — G8929 Other chronic pain: Secondary | ICD-10-CM | POA: Diagnosis not present

## 2019-01-20 DIAGNOSIS — E785 Hyperlipidemia, unspecified: Secondary | ICD-10-CM | POA: Insufficient documentation

## 2019-01-20 DIAGNOSIS — Z79899 Other long term (current) drug therapy: Secondary | ICD-10-CM | POA: Diagnosis not present

## 2019-01-20 DIAGNOSIS — Z8249 Family history of ischemic heart disease and other diseases of the circulatory system: Secondary | ICD-10-CM | POA: Diagnosis not present

## 2019-01-20 DIAGNOSIS — R825 Elevated urine levels of drugs, medicaments and biological substances: Secondary | ICD-10-CM

## 2019-01-20 LAB — POCT INR: INR: 1.9 — AB (ref 2.0–3.0)

## 2019-01-20 MED ORDER — DULOXETINE HCL 30 MG PO CPEP: 30.0000 mg | ORAL_CAPSULE | Freq: Two times a day (BID) | ORAL | 2 refills | Status: DC

## 2019-01-20 MED FILL — METOCLOPRAMIDE 10 MG TABLET: 10 | 3 days supply | Qty: 10 | Fill #0

## 2019-01-20 NOTE — Progress Notes (Signed)
Patient ID: Russell Carter, male    DOB: Nov 03, 1955  MRN: [id number]  CC: Hospitalization Follow-up   Subjective: Russell Carter is a 63 y.o. male who presents for hosp f/u His concerns today include:  Pt with hx ofpre-DM, HTN, HL, CAD,anxiety, gout, dep/anxiety, sz/pseudoseizure,   Pt hosp 3/4-03/2019 with worsening weakness on LT side and collapse. Thought to be stroke like symptoms and was given TPA.  Imaging including CT of the head and MRI did not reveal any acute events and CTA did not reveal any carotid artery blockages.  Echo revealed good heart function with EF of 60 to 65%.  Labs showed an A1c of 6.3, LDL of 47, normal H&H, acute kidney injury with creatinine of 1.54 and mild elevation of AST 55 and total bili of 1.6.  Coumadin was held while patient was in the hospital and was subsequently restarted the day after he was discharged. -Has home PT 2-3 x a wk.  Finding it helpful and does his own exercises TID as instructed by the therapist.  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  Wanting something for pain in neck and spine  Reports UDS pos for cocaine and negative for Oxycodone last mth so he was dismissed from pain management through [facility]. Pt denies using cocaine since 2008.  States he was at a party where people was using cocaine and he thinks it got into his system just from being around them.  Pt states he did not have Oxycodone in urine because he had taken the last one 4 days before due to knee pain he had going on at this time.  -Pain constant.  "As long as I'm doing something I'm fine but when I am sitting down, the pain is severe." Pt shoots up into back of head on LT side. Pain also shoots into LT arm to level of elbow.    HTN/CAD: compliant with taking meds No CP/SOB/LE edema  DVT: Patient has restarted Coumadin.  He denies any blood in  the urine or stools at this time.  He will be seeing the clinical pharmacist today for INR check.  Patient Active Problem List   Diagnosis Date Noted   Stroke (HCC) 12/24/2018   CKD (chronic kidney disease), stage III (HCC) 05/06/2018   Seizure (HCC) 05/06/2018   Normocytic anemia 05/06/2018   Leukopenia 05/06/2018   AKI (acute kidney injury) (HCC)    Acute kidney injury (HCC) 04/27/2018   Abdominal pain 04/27/2018   Chronic anticoagulation 04/27/2018   Left knee pain 01/10/2018   Numbness and tingling in both hands 07/31/2017   Gastroesophageal reflux disease without esophagitis 07/31/2017   Chronic pain disorder 06/05/2017   History of pulmonary embolus (PE) 02/14/2017   Diabetes mellitus type 2 in nonobese (HCC)    Cocaine abuse (HCC)    Depression    History of CVA (cerebrovascular accident)    Cervical spondylosis with radiculopathy 01/24/2017   Dyslipidemia 03/15/2016   Complete heart block (HCC) 03/14/2016   Cervical spinal stenosis 03/14/2016   CAD-50% LAD 2011 12/14/2014   Gout 11/16/2014   Polysubstance abuse (HCC) 01/15/2013   DIZZINESS 06/05/2010   Essential hypertension 05/18/2009     Current Outpatient Medications on File Prior to Visit  Medication Sig Dispense Refill   allopurinol (ZYLOPRIM) 300 MG tablet TAKE 1 TABLET (300 MG TOTAL) BY MOUTH DAILY. 30 tablet  2   amLODipine (NORVASC) 5 MG tablet Take 1 tablet (5 mg total) by mouth daily. 30 tablet 2   aspirin EC 81 MG tablet Take 1 tablet (81 mg total) by mouth daily. 30 tablet 2   Calcium Carb-Cholecalciferol (CALCIUM-VITAMIN D) 600-400 MG-UNIT TABS Take 1 tablet by mouth daily. 30 tablet 5   divalproex (DEPAKOTE) 500 MG DR tablet Take 1 tablet (500 mg total) by mouth 2 (two) times daily. 60 tablet 5   DULoxetine (CYMBALTA) 30 MG capsule Take 1 capsule (30 mg total) by mouth 2 (two) times daily. 60 capsule 5   folic acid (FOLVITE) 1 MG tablet Take 1 tablet (1 mg total) by  mouth daily. 30 tablet 5   gabapentin (NEURONTIN) 400 MG capsule Take 1 capsule (400 mg total) by mouth 2 (two) times daily. 180 capsule 3   gabapentin (NEURONTIN) 600 MG tablet Take 600 mg by mouth 4 (four) times daily.  0   losartan (COZAAR) 100 MG tablet Take 1 tablet (100 mg total) by mouth daily. 30 tablet 2   methocarbamol (ROBAXIN) 750 MG tablet Take 1 tablet (750 mg total) by mouth every 8 (eight) hours as needed for muscle spasms. 90 tablet 1   metoCLOPramide (REGLAN) 10 MG tablet TAKE 1 TABLET BY MOUTH EVERY 6 HOURS AS NEEDED FOR NAUSEA OR HEADACHE. 10 tablet 3   Multiple Vitamin (MULTIVITAMIN WITH MINERALS) TABS tablet Take 1 tablet by mouth daily. 30 tablet 5   nitroGLYCERIN (NITROSTAT) 0.4 MG SL tablet Place 1 tablet (0.4 mg total) under the tongue every 5 (five) minutes as needed for chest pain. 25 tablet 4   oxyCODONE-acetaminophen (PERCOCET) 7.5-325 MG tablet Take 1 tablet by mouth 3 (three) times daily. 20 tablet 0   rosuvastatin (CRESTOR) 40 MG tablet Take 1 tablet (40 mg total) by mouth daily. 30 tablet 5   tamsulosin (FLOMAX) 0.4 MG CAPS capsule Take 1 capsule (0.4 mg total) by mouth at bedtime. 30 capsule 3   traZODone (DESYREL) 100 MG tablet Take 1 tablet (100 mg total) by mouth at bedtime as needed for sleep. 30 tablet 5   No current facility-administered medications on file prior to visit.     Allergies  Allergen Reactions   Shellfish Allergy Anaphylaxis    Social History   Socioeconomic History   Marital status: Widowed    Spouse name: Not on file   Number of children: Not on file   Years of education: Not on file   Highest education level: Not on file  Occupational History   Not on file  Social Needs   Financial resource strain: Not on file   Food insecurity:    Worry: Not on file    Inability: Not on file   Transportation needs:    Medical: Not on file    Non-medical: Not on file  Tobacco Use   Smoking status: Never Smoker    Smokeless tobacco: Never Used  Substance and Sexual Activity   Alcohol use: Yes    Comment: 16 oz beer every 2-3 days   Drug use: Yes    Types: "Crack" cocaine, Marijuana, Cocaine    Comment:  "quit 2008"   Sexual activity: Not Currently  Lifestyle   Physical activity:    Days per week: Not on file    Minutes per session: Not on file   Stress: Not on file  Relationships   Social connections:    Talks on phone: Not on file    Gets together:  Not on file    Attends religious service: Not on file    Active member of club or organization: Not on file    Attends meetings of clubs or organizations: Not on file    Relationship status: Not on file   Intimate partner violence:    Fear of current or ex partner: Not on file    Emotionally abused: Not on file    Physically abused: Not on file    Forced sexual activity: Not on file  Other Topics Concern   Not on file  Social History Narrative   Lives in [place] with his wife and son.     Family History  Problem Relation Age of Onset   Heart attack Mother        x7. Still alive   Heart attack Father        deceased b/c of MI   Heart attack Brother        older, deceased b/c of MI    Cancer Brother        bone cancer in 1 brother, ? brain cancer in other brother   Heart attack Brother    Other Brother        MVA   Other Brother    Bone cancer Brother    Cancer Brother    SIDS Sister     Past Surgical History:  Procedure Laterality Date   ANTERIOR CERVICAL DECOMP/DISCECTOMY FUSION N/A 01/24/2017   Procedure: Cervical Four-Five,Cervical Five Six,Cervical Six-Seven Anterior cervical discectomy with fusion and plate with Cervical Three-Thoracic- One Laminectomy/Foraminotomy with Cervical Three-Thoracic- One Fixation and fusion;  Surgeon: Loura Halt Ditty, MD;  Location: [facility] OR;  Service: Neurosurgery;  Laterality: N/A;  Part-1 anterior approach   CARDIAC CATHETERIZATION  2008; ~ 2011   [id number]%lad    CARDIAC CATHETERIZATION N/A 03/16/2016   Procedure: Left Heart Cath and Coronary Angiography;  Surgeon: Marykay Lex, MD;  Location: [facility] INVASIVE CV LAB;  Service: Cardiovascular;  Laterality: N/A;   COLONOSCOPY WITH PROPOFOL N/A 04/17/2018   Procedure: COLONOSCOPY WITH PROPOFOL;  Surgeon: Graylin Shiver, MD;  Location: [facility] ENDOSCOPY;  Service: Endoscopy;  Laterality: N/A;   CORONARY ANGIOPLASTY     STENTS   EYE SURGERY Bilateral    "for double vision"   INGUINAL HERNIA REPAIR  05/12/2012   Procedure: HERNIA REPAIR INGUINAL ADULT;  Surgeon: Liz Malady, MD;  Location:  SURGERY CENTER;  Service: General;  Laterality: Right;   INGUINAL HERNIA REPAIR Left 02/02/2003   Dr. Violeta Gelinas. repair with mesh. Same procedure done again in  05/22/2004    INGUINAL HERNIA REPAIR Left ?date 2nd OR   LEFT HEART CATH AND CORONARY ANGIOGRAPHY N/A 04/29/2018   Procedure: LEFT HEART CATH AND CORONARY ANGIOGRAPHY;  Surgeon: Swaz[place], Peter M, MD;  Location: [facility] INVASIVE CV LAB;  Service: Cardiovascular;  Laterality: N/A;   POSTERIOR CERVICAL FUSION/FORAMINOTOMY N/A 01/24/2017   Procedure: Cervical Three-Thoracic One-Laminectomy/Foraminotomy with Cervical Three-Thoracic One Fixation and fusion;  Surgeon: Loura Halt Ditty, MD;  Location: [facility] OR;  Service: Neurosurgery;  Laterality: N/A;  Part-2 Posterior approach   RECONSTRUCT / STABILIZE DISTAL ULNA     SHOULDER OPEN ROTATOR CUFF REPAIR Left 2010   SHOULDER SURGERY Left    "injured on the job; had to cut small bone out"   THUMB FUSION Right     ROS: Review of Systems Negative except as stated above  PHYSICAL EXAM: BP 124/74    Pulse 84    Temp 97.7  F (36.5 C) (Oral)    Resp 16    Wt 217 lb 3.2 oz (98.5 kg)    SpO2 95%    BMI 30.29 kg/m   Wt Readings from Last 3 Encounters:  01/20/19 217 lb 3.2 oz (98.5 kg)  01/02/19 216 lb 0.8 oz (98 kg)  12/24/18 216 lb 7.9 oz (98.2 kg)   Physical Exam  General appearance - alert, well  appearing, and in no distress Mental status - normal mood, behavior, speech, dress, motor activity, and thought processes Neck -no cervical lymphadenopathy Chest - clear to auscultation, no wheezes, rales or rhonchi, symmetric air entry Heart - normal rate, regular rhythm, normal S1, S2, no murmurs, rubs, clicks or gallops Neurological -patient oriented.  Cranial nerves are grossly intact.  Grip on the left  4+/5, R 5/5; power LEs 5/5 RT, 4+/5 LT.  Gross sensation intact.  Pt ambulates with a cane extremities -no LE edema  CMP Latest Ref Rng & Units 01/02/2019 12/24/2018 12/24/2018  Glucose 70 - 99 mg/dL 578(I) - 696(E)  BUN 8 - 23 mg/dL 13 - 11  Creatinine 9.52 - 1.24 mg/dL 8.41(L) 2.44(W) 1.02  Sodium 135 - 145 mmol/L 138 - 137  Potassium 3.5 - 5.1 mmol/L 4.1 - 4.5  Chloride 98 - 111 mmol/L 105 - 110  CO2 22 - 32 mmol/L 24 - 17(L)  Calcium 8.9 - 10.3 mg/dL 9.1 - 8.9  Total Protein 6.5 - 8.1 g/dL - - 6.3(L)  Total Bilirubin 0.3 - 1.2 mg/dL - - 1.6(H)  Alkaline Phos 38 - 126 U/L - - 51  AST 15 - 41 U/L - - 55(H)  ALT 0 - 44 U/L - - 36   Lipid Panel     Component Value Date/Time   CHOL 162 12/25/2018 0801   CHOL 165 06/26/2018 0842   TRIG 351 (H) 12/25/2018 0801   HDL 45 12/25/2018 0801   HDL 50 06/26/2018 0842   CHOLHDL 3.6 12/25/2018 0801   VLDL 70 (H) 12/25/2018 0801   LDLCALC 47 12/25/2018 0801   LDLCALC 89 06/26/2018 0842    CBC    Component Value Date/Time   WBC 5.2 01/02/2019 1417   RBC 5.49 01/02/2019 1417   HGB 16.0 01/02/2019 1417   HGB 14.2 04/07/2018 1431   HCT 49.1 01/02/2019 1417   HCT 43.5 04/07/2018 1431   PLT 208 01/02/2019 1417   PLT 215 04/07/2018 1431   MCV 89.4 01/02/2019 1417   MCV 91 04/07/2018 1431   MCH 29.1 01/02/2019 1417   MCHC 32.6 01/02/2019 1417   RDW 14.1 01/02/2019 1417   RDW 15.5 (H) 04/07/2018 1431   LYMPHSABS 1.8 12/24/2018 2146   LYMPHSABS 2.1 04/07/2018 1431   MONOABS 0.5 12/24/2018 2146   EOSABS 0.0 12/24/2018 2146   EOSABS  0.1 04/07/2018 1431   BASOSABS 0.0 12/24/2018 2146   BASOSABS 0.0 04/07/2018 1431    ASSESSMENT AND PLAN: 1. Hospital discharge follow-up 2. Stroke-like episode (HCC) 3. Left-sided weakness Strength is improving on the left side.  He will continue home physical therapy until he reaches their set goal.  4. Essential hypertension Repeat blood pressure at goal.  He will continue amlodipine and Cozaar.  5. Cervical radiculopathy Advised patient that we can refer him to another pain specialist as we do not prescribe Percocet or hydrocodone in this practice.  Also the reason for recent dismissal from his previous pain specialist does not look good. - Ambulatory referral to Pain Clinic  6. Positive  urine drug screen Patient denies any use of cocaine and stated that it only showed up in his urine because he was at a party where other people was using.  He does not feel that he needs to be in any treatment program  7. AKI (acute kidney injury) (HCC) - Comprehensive metabolic panel  8. Abnormal LFTs - Comprehensive metabolic panel   Patient was given the opportunity to ask questions.  Patient verbalized understanding of the plan and was able to repeat key elements of the plan.   Orders Placed This Encounter  Procedures   Comprehensive metabolic panel   Ambulatory referral to Pain Clinic   INR     Requested Prescriptions    No prescriptions requested or ordered in this encounter    Return in about 3 months (around 04/21/2019).  Jonah Blue, MD, FACP

## 2019-01-20 NOTE — Progress Notes (Signed)
Pt is requesting pain medicine for his neck and spine

## 2019-01-20 NOTE — Progress Notes (Signed)
    Pharmacy Anticoagulation Clinic  Subjective: Patient presents today for INR monitoring. 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   Current dose of warfarin: 25 mg weekly  Adherence to warfarin: confirms; of note, pt recently hospitalized and reports that previous to that hospitalization, he had been without warfarin for ~1 week. Has since resumed warfarin at above dose. Has been taking consistently for 3 weeks.  Signs/symptoms of bleeding: denies  Recent changes in diet: denies  Recent changes in medications: denies  Upcoming procedures that may impact anticoagulation: none  Objective: Today's INR = 1.9  Lab Results  Component Value Date   INR 1.9 (A) 01/20/2019   INR 1.7 (H) 01/02/2019   INR 1.4 (H) 12/24/2018     Assessment and Plan: Anticoagulation: Patient is subtherapeutic based on patient's INR of 1.9 and patient's INR goal of 2-3. Will Add to today's dose. Pt has been instructed to take 1 full tablet (5 mg) tonight and then resume normal schedule of 5 mg MWF (1 tablet) with 2.5 mg all other days (0.5 tablets).  Patient verbalized understanding and was provided with written instructions. Next INR check planned for next Wednesday (01/28/19).   Butch Penny, PharmD, CPP Clinical Pharmacist [facility] [phone]

## 2019-01-20 NOTE — Patient Instructions (Signed)
From [facility]:   Take 1 whole tablet today, then continue regular regimen of 1 tablet every Monday, Wednesday, and Friday with 1/2 tablet all other days.   I will see you next Wednesday to recheck your INR.

## 2019-01-21 ENCOUNTER — Telehealth: Payer: Self-pay

## 2019-01-21 LAB — COMPREHENSIVE METABOLIC PANEL
ALT: 31 IU/L (ref 0–44)
AST: 40 IU/L (ref 0–40)
Albumin/Globulin Ratio: 1.5 (ref 1.2–2.2)
Albumin: 4.5 g/dL (ref 3.8–4.8)
Alkaline Phosphatase: 55 IU/L (ref 39–117)
BUN/Creatinine Ratio: 11 (ref 10–24)
BUN: 14 mg/dL (ref 8–27)
Bilirubin Total: 0.4 mg/dL (ref 0.0–1.2)
CALCIUM: 9.5 mg/dL (ref 8.6–10.2)
CO2: 21 mmol/L (ref 20–29)
Chloride: 102 mmol/L (ref 96–106)
Creatinine, Ser: 1.29 mg/dL — ABNORMAL HIGH (ref 0.76–1.27)
GFR calc Af Amer: 68 mL/min/{1.73_m2} (ref >59)
GFR calc non Af Amer: 59 mL/min/{1.73_m2} — ABNORMAL LOW (ref >59)
Globulin, Total: 3 g/dL (ref 1.5–4.5)
Glucose: 118 mg/dL — ABNORMAL HIGH (ref 65–99)
Potassium: 4.2 mmol/L (ref 3.5–5.2)
Sodium: 140 mmol/L (ref 134–144)
Total Protein: 7.5 g/dL (ref 6.0–8.5)

## 2019-01-21 MED FILL — DULoxetine HCL 30 MG CPEP: 30 | 90 days supply | Qty: 180 | Fill #0

## 2019-01-21 NOTE — Telephone Encounter (Signed)
Contacted pt to go over lab results pt didn't answer left a detailed vm informing pt of results and if he has any questions or concerns to give me a call  

## 2019-01-28 ENCOUNTER — Ambulatory Visit: Payer: Medicaid Other | Attending: Family Medicine | Admitting: Pharmacist

## 2019-01-28 ENCOUNTER — Other Ambulatory Visit: Payer: Self-pay

## 2019-01-28 DIAGNOSIS — Z86711 Personal history of pulmonary embolism: Secondary | ICD-10-CM | POA: Diagnosis present

## 2019-01-28 LAB — POCT INR: INR: 2.3 (ref 2.0–3.0)

## 2019-01-28 IMAGING — DX DG ABD PORTABLE 1V
1 series · 1 of 1 positions shown · non-contrast
Comparison: Portable exam 4772 hours compared to CT abdomen and
pelvis of 03/13/2017

CLINICAL DATA: Abdominal pain, unresponsive

EXAM:
PORTABLE ABDOMEN - 1 VIEW

[abdomen]
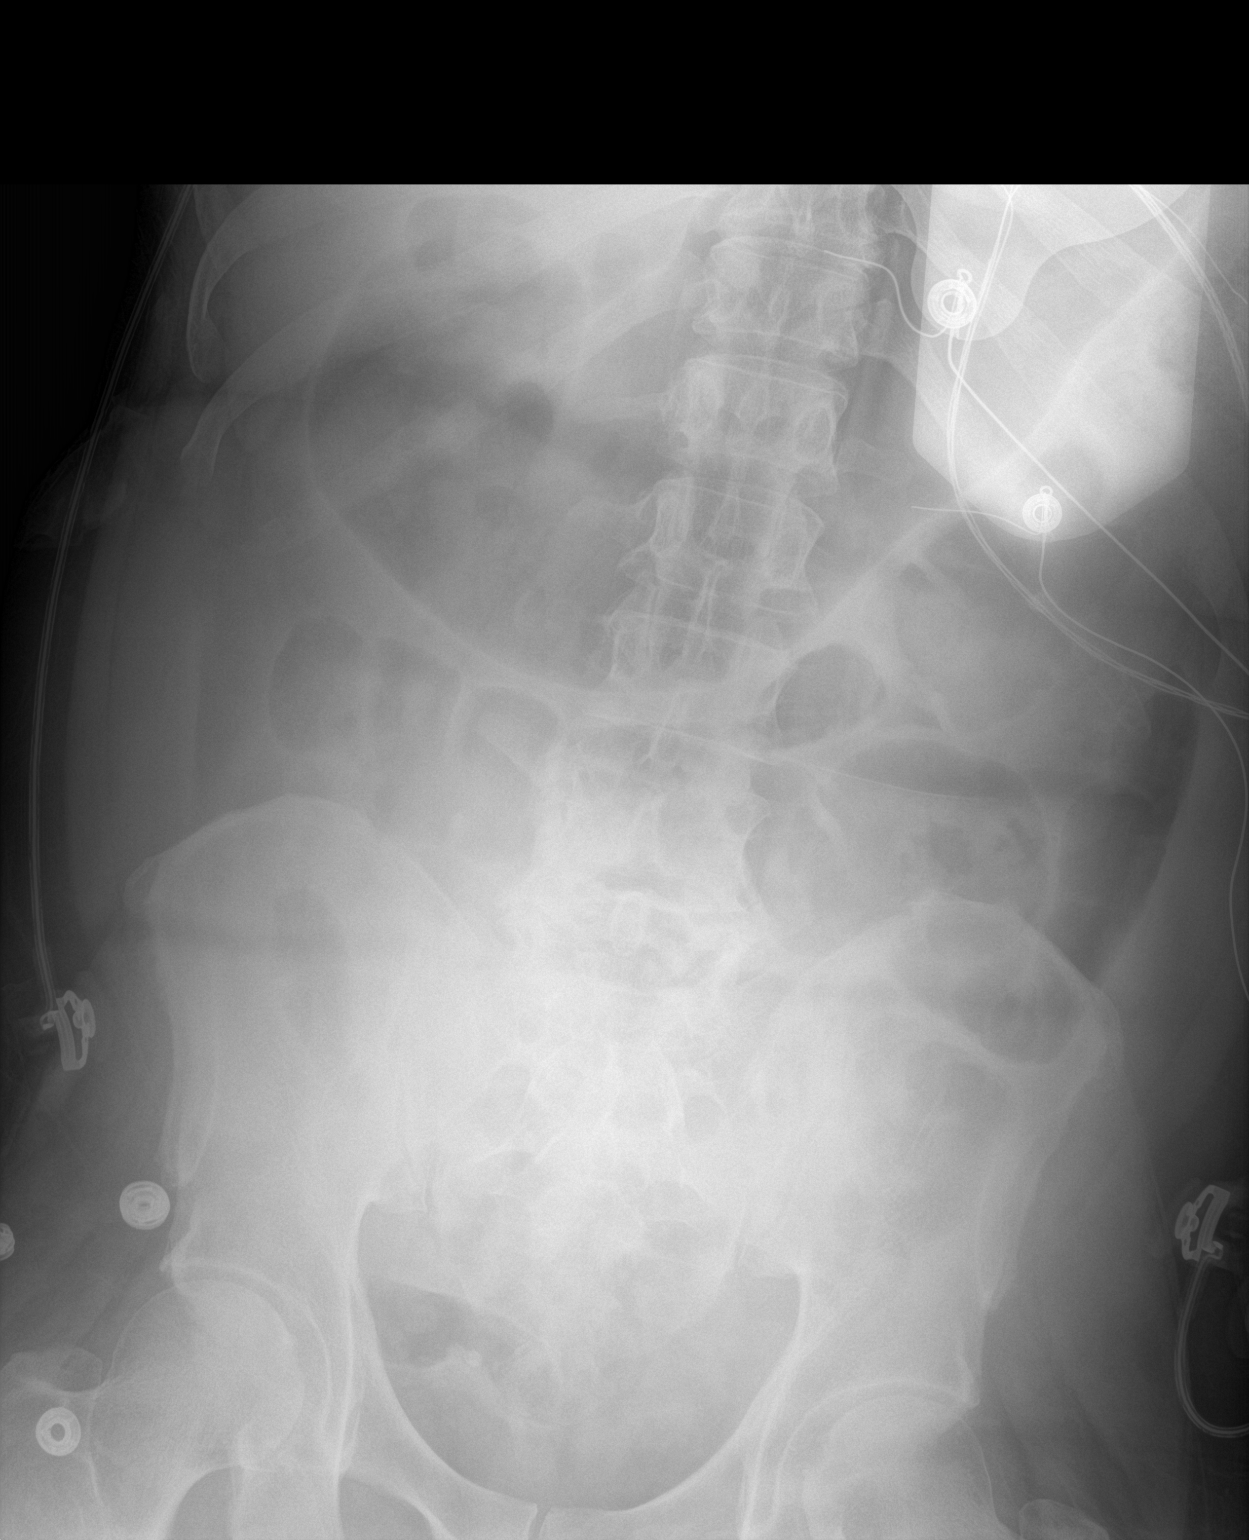

[1 of 1 positions shown; findings below may reference images not displayed]

FINDINGS: Gaseous distention of stomach.

Large and small bowel gas pattern normal.

No bowel dilatation or bowel wall thickening.

Degenerative changes at lower lumbar spine.
IMPRESSION: Gaseous distention of stomach.

## 2019-01-28 IMAGING — DX DG CHEST 1V PORT
1 series · 1 of 1 positions shown · non-contrast
Comparison: Portable exam 6880 hours compared to 03/12/2017

CLINICAL DATA: Unresponsive, abdominal and leg pain

EXAM:
PORTABLE CHEST 1 VIEW

[chest]
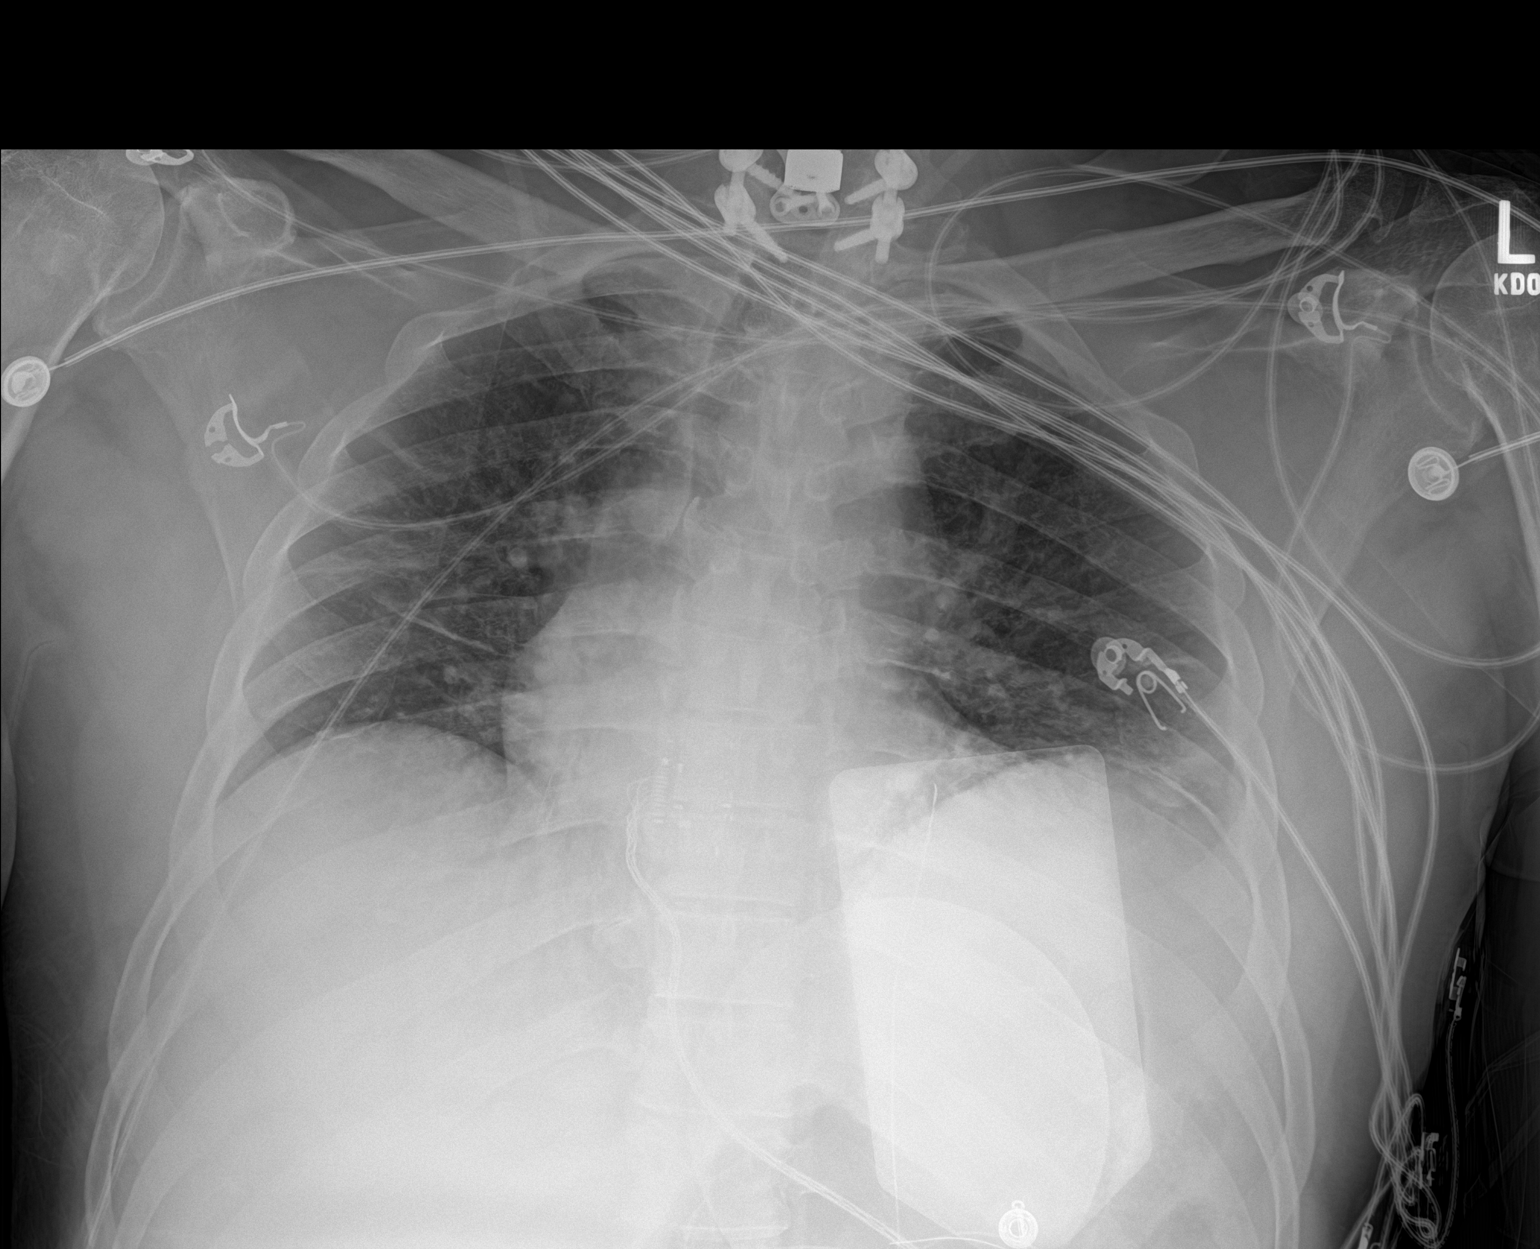

[1 of 1 positions shown; findings below may reference images not displayed]

FINDINGS: External pacing leads project over chest.

Normal heart size, mediastinal contours, and pulmonary vascularity.

Low lung volumes with bibasilar atelectasis.

No definite infiltrate, pleural effusion or pneumothorax.

Cervical spine fusion.
IMPRESSION: Low lung volumes with bibasilar atelectasis.

## 2019-01-29 IMAGING — CT CT ABD-PELV W/O CM
2 of 4 series · 15 of 46 positions shown, 17 images · non-contrast
Comparison: 03/13/2017

CLINICAL DATA: Acute generalized abdominal pain.

EXAM:
CT ABDOMEN AND PELVIS WITHOUT CONTRAST
TECHNIQUE: Multidetector CT imaging of the abdomen and pelvis was performed
following the standard protocol without IV contrast.

[Series 4: a/p w/o 5mm · axial · non-contrast · 0.81mm/px · z∈[-871,-376]mm · 12 of 114 slices shown, 14 images]
[im 10/114  soft-tissue]
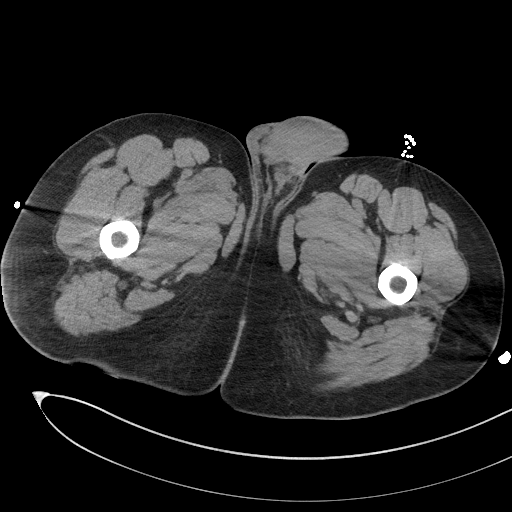
[im 10/114  bone]
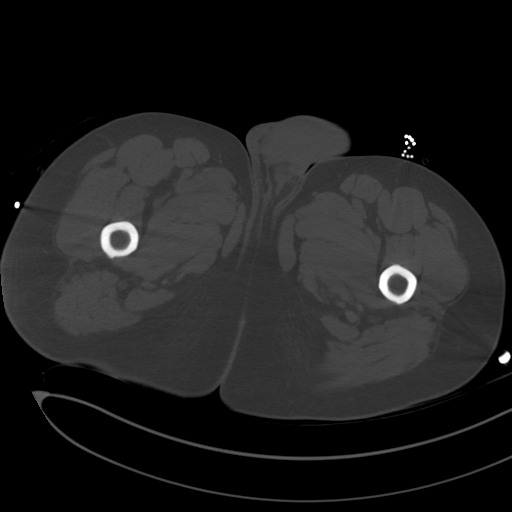
[im 19/114  soft-tissue]
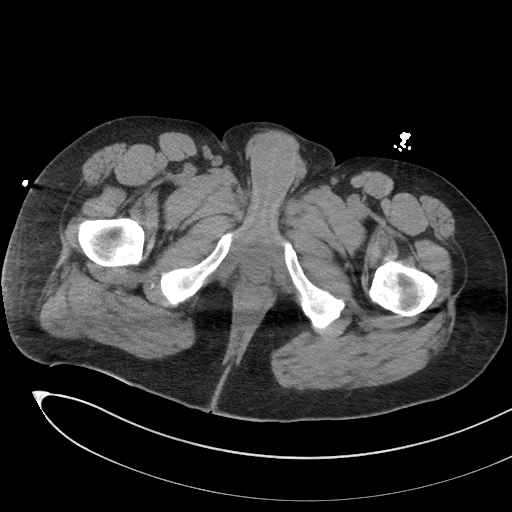
[im 28/114  soft-tissue]
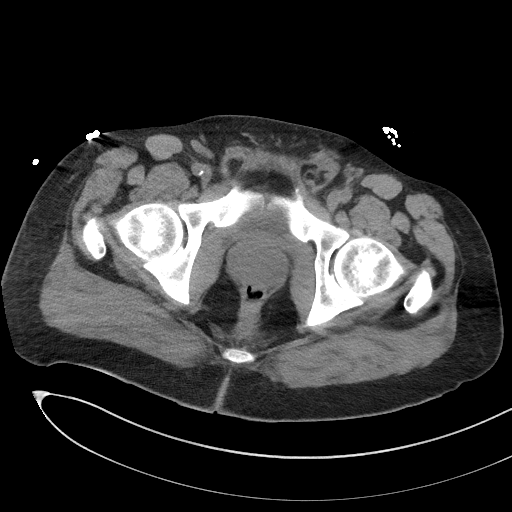
[im 37/114  soft-tissue]
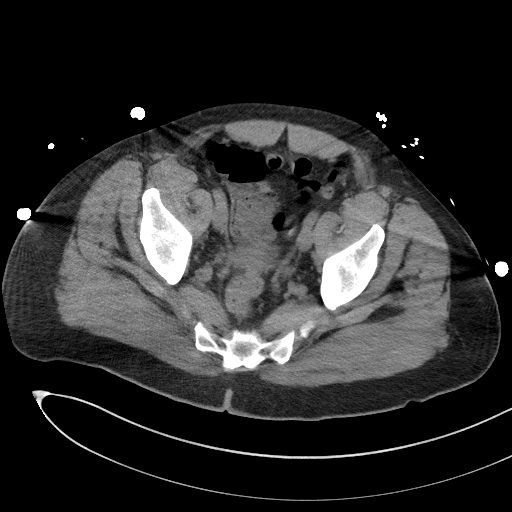
[im 46/114  soft-tissue]
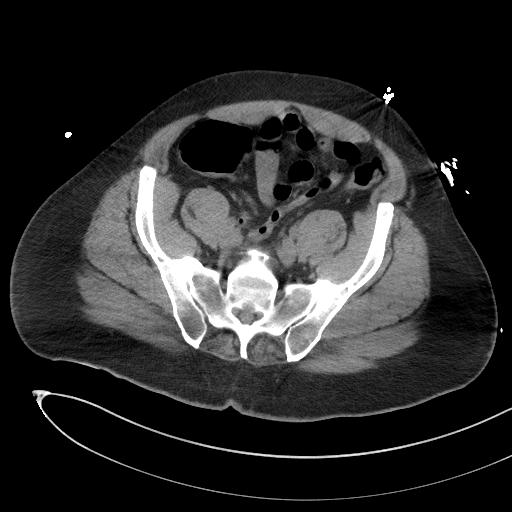
[im 55/114  soft-tissue]
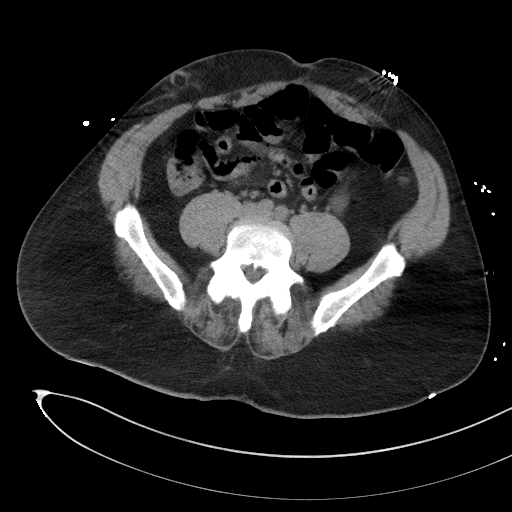
[im 64/114  soft-tissue]
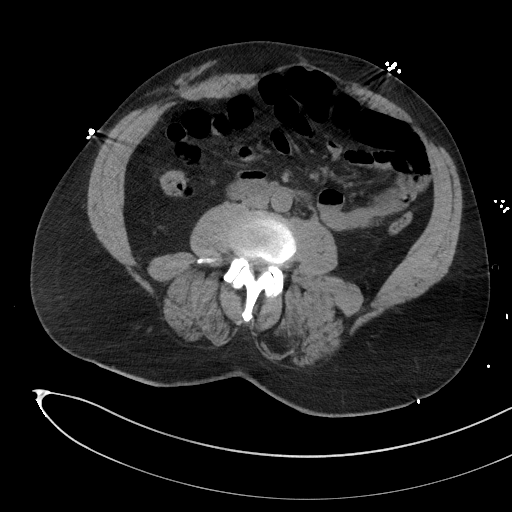
[im 73/114  soft-tissue]
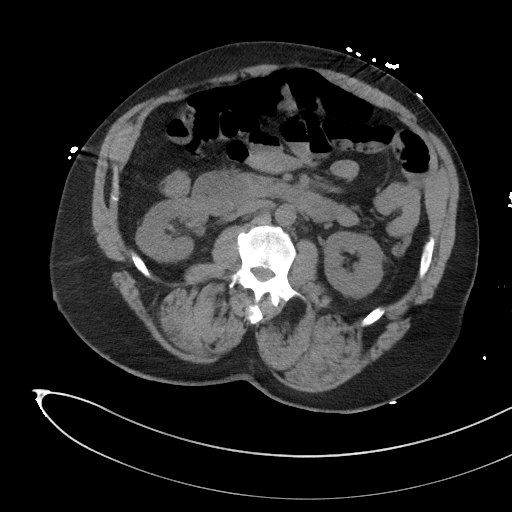
[im 82/114  soft-tissue]
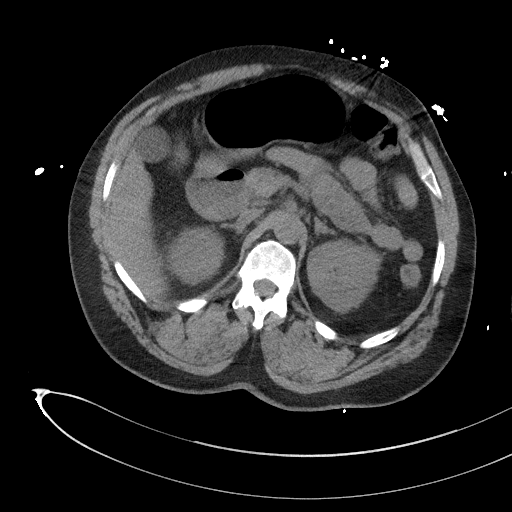
[im 82/114  bone]
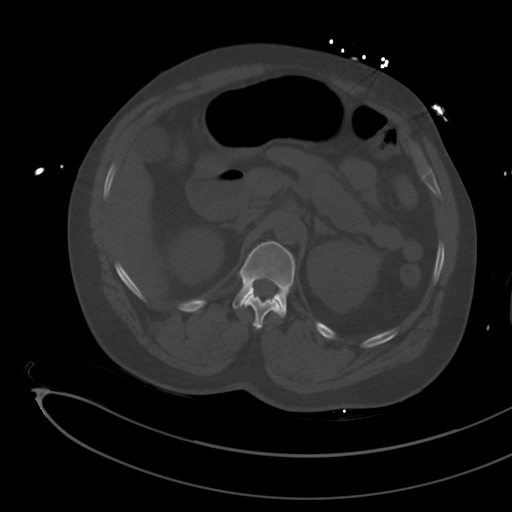
[im 91/114  soft-tissue]
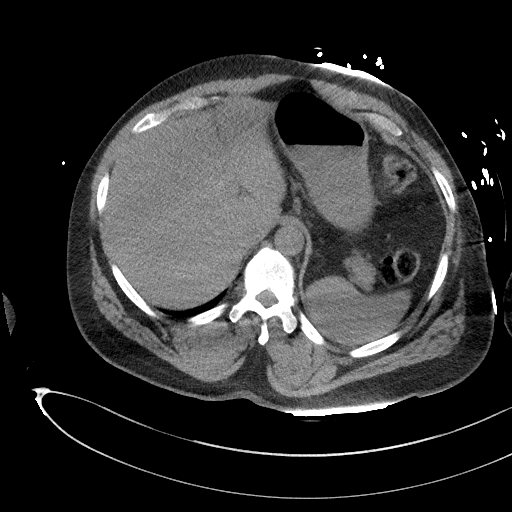
[im 100/114  soft-tissue]
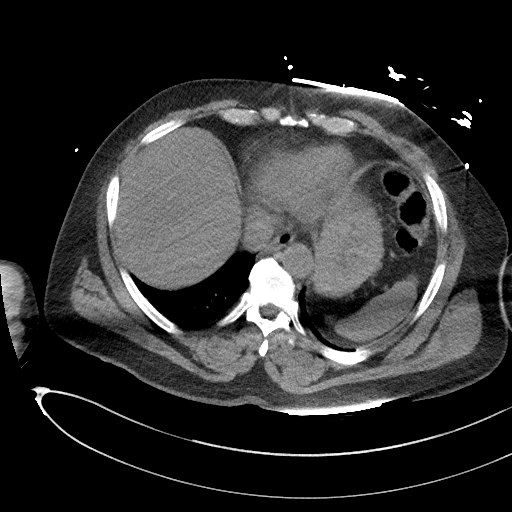
[im 109/114  soft-tissue]
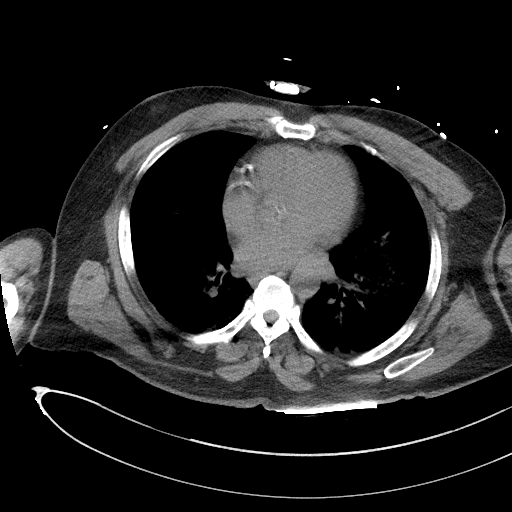

[Series 7: a/p w/o cor · coronal · non-contrast · 0.79mm/px · 3 of 151 slices shown]
[im 51/151  soft-tissue]
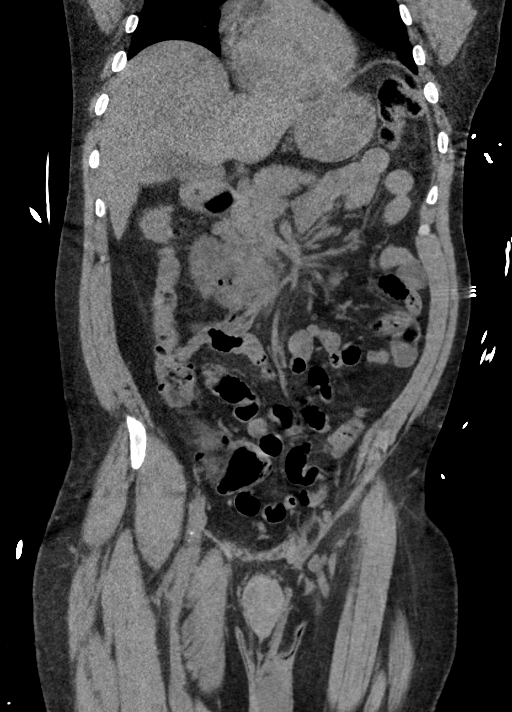
[im 67/151  soft-tissue]
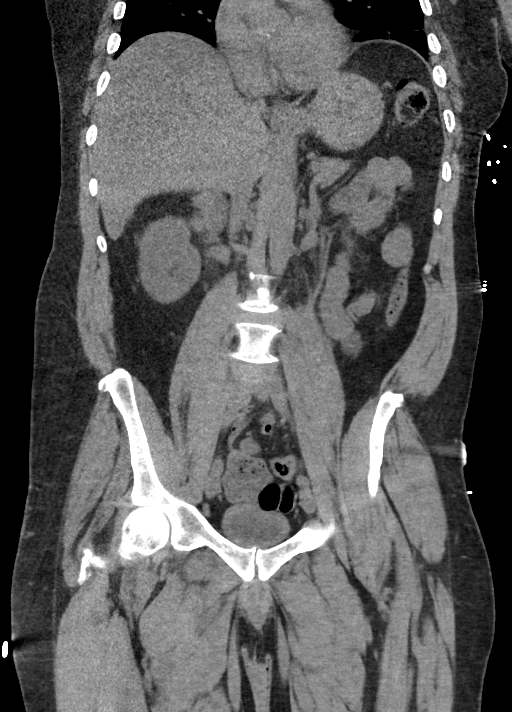
[im 84/151  soft-tissue]
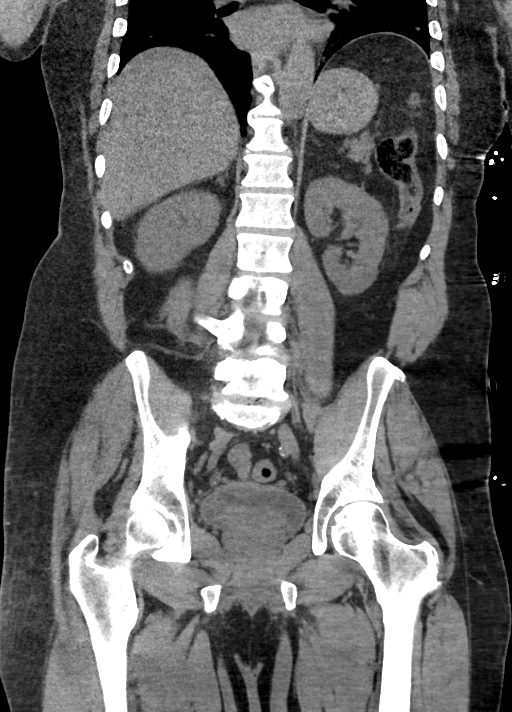

[15 of 46 positions shown; findings below may reference images not displayed]

FINDINGS: Lower chest: Atelectasis in the lung bases. Coronary artery
calcifications.

Hepatobiliary: No focal liver abnormality is seen. No gallstones,
gallbladder wall thickening, or biliary dilatation.

Pancreas: Unremarkable. No pancreatic ductal dilatation or
surrounding inflammatory changes.

Spleen: Normal in size without focal abnormality.

Adrenals/Urinary Tract: Adrenal glands are unremarkable. Kidneys are
normal, without renal calculi, focal lesion, or hydronephrosis.
Bladder is unremarkable.

Stomach/Bowel: Stomach, small bowel, and colon are not abnormally
distended. No wall thickening is appreciated although under
distention limits evaluation. No inflammatory infiltration.
Scattered diverticula in the colon. No evidence of diverticulitis.
Appendix is normal.

Vascular/Lymphatic: No significant vascular findings are present. No
enlarged abdominal or pelvic lymph nodes.

Reproductive: Prostate gland is not enlarged.

Other: No free air or free fluid in the abdomen. Abdominal wall
musculature appears intact. Postoperative changes in the left
inguinal region suggesting previous hernia repair. There patchy
areas of infiltration and emphysema in the subcutaneous fat of the
anterior abdominal wall. These likely represent subcutaneous
injection sites. Surface contusion or laceration could also have
this appearance in the appropriate clinical setting.

Musculoskeletal: Degenerative changes in the lumbar spine. No
destructive bone lesions.
IMPRESSION: 1. No evidence of bowel obstruction or inflammation.
2. Atelectasis in the lung bases.
3. Coronary artery calcifications.
4. Patchy areas of infiltration and emphysema in the subcutaneous
fat of the anterior abdominal wall, likely representing subcutaneous
injection sites.

## 2019-01-29 IMAGING — DX DG ABD PORTABLE 1V
1 series · 1 of 1 positions shown · non-contrast
Comparison: 04/26/2018

CLINICAL DATA: NG tube placement

EXAM:
PORTABLE ABDOMEN - 1 VIEW

[abdomen]
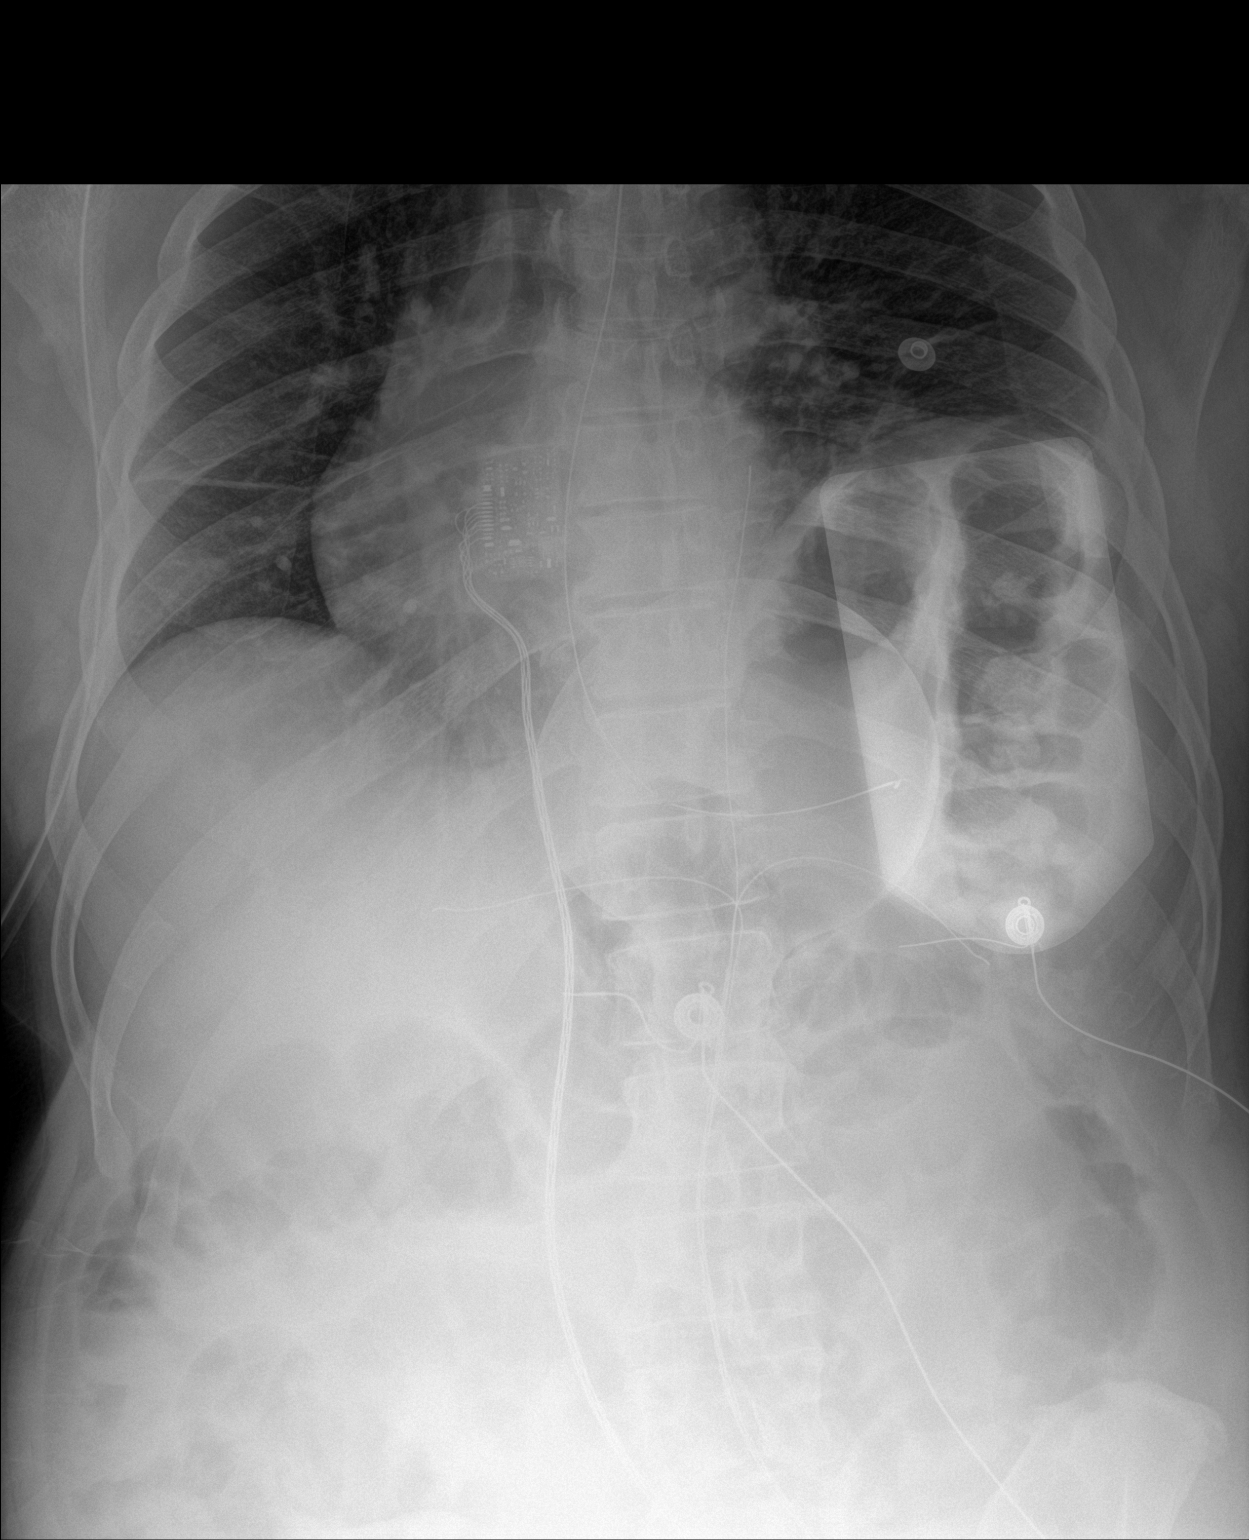

[1 of 1 positions shown; findings below may reference images not displayed]

FINDINGS: Elevation of the left diaphragm with atelectasis at the left base.
Esophageal tube tip overlies the gastric body. Mild diffuse
increased bowel gas.
IMPRESSION: Esophageal tube tip overlies the gastric body

## 2019-01-29 IMAGING — CT CT HEAD W/O CM
4 series · 17 of 47 positions shown, 19 images · non-contrast
Comparison: CT head 07/31/2017.  MRI brain 05/15/2017.

CLINICAL DATA: Altered level of consciousness.

EXAM:
CT HEAD WITHOUT CONTRAST
TECHNIQUE: Contiguous axial images were obtained from the base of the skull
through the vertex without intravenous contrast.

[Series 3: head without · axial · non-contrast · 0.43mm/px · z∈[-134,-4]mm · 7 of 36 slices shown, 9 images]
[im 5/36  brain]
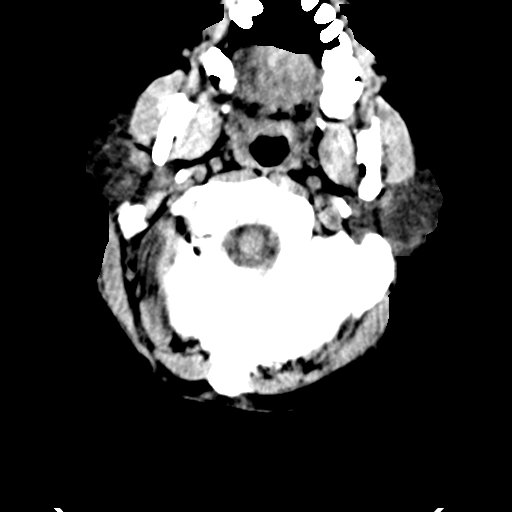
[im 5/36  bone]
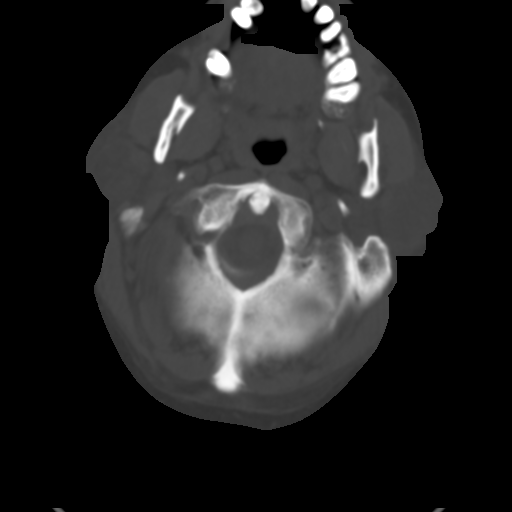
[im 9/36  brain]
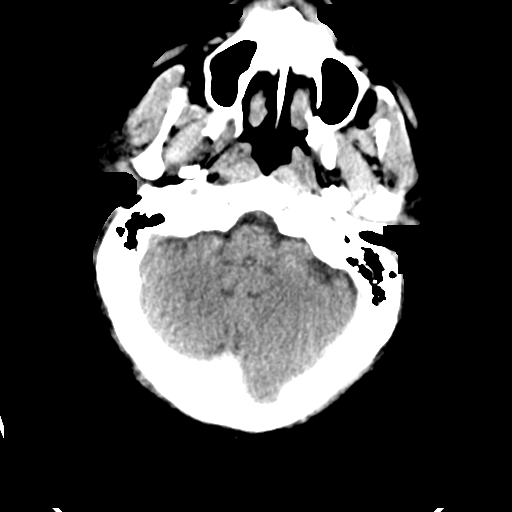
[im 14/36  brain]
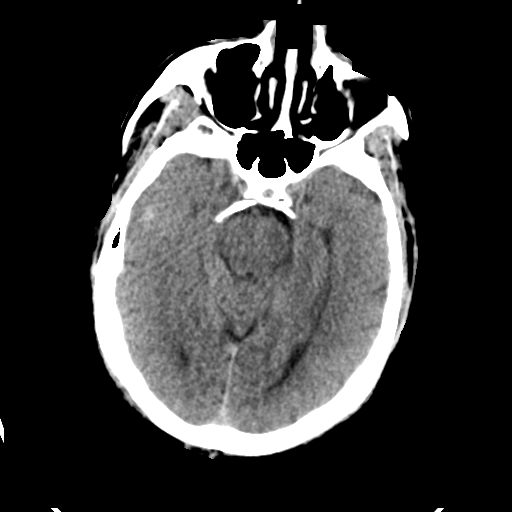
[im 18/36  brain]
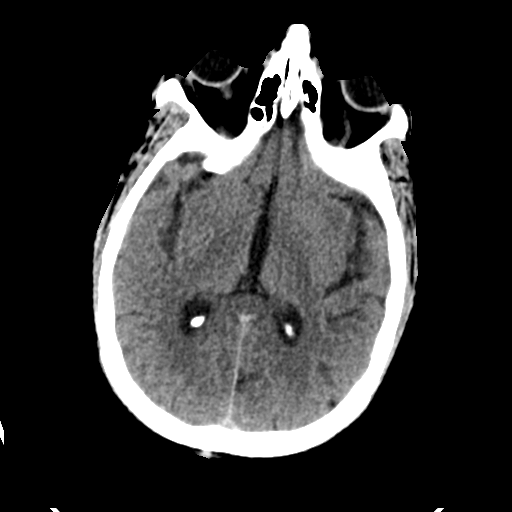
[im 22/36  brain]
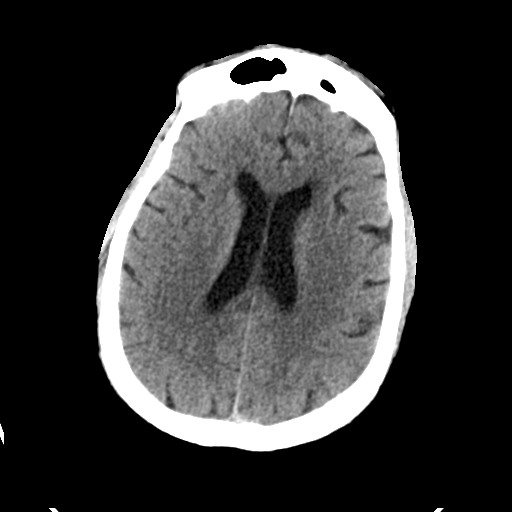
[im 22/36  bone]
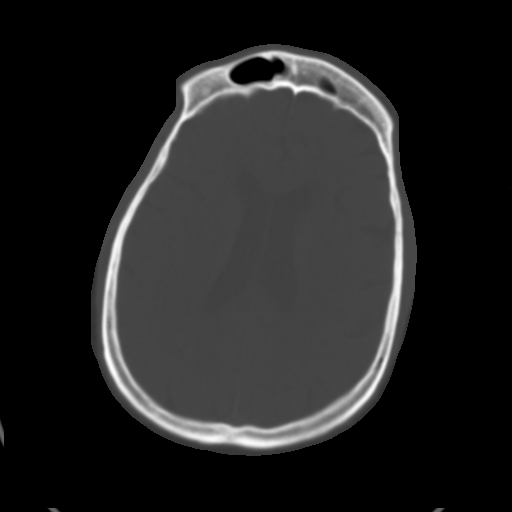
[im 27/36  brain]
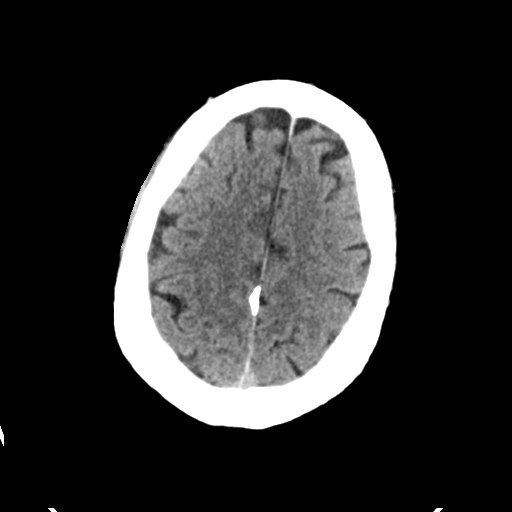
[im 31/36  brain]
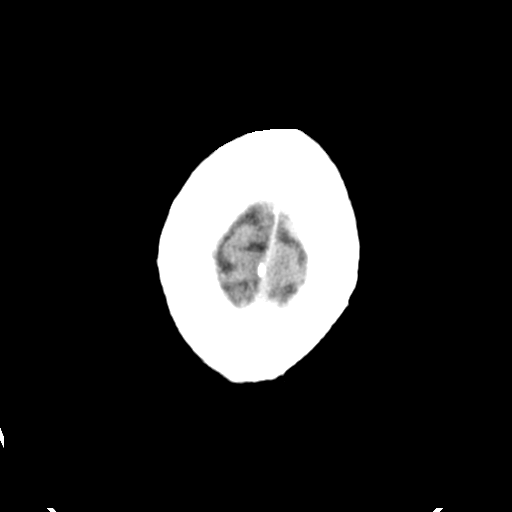

[Series 4: head bone · axial · 0.43mm/px · z∈[-138,-74]mm · 4 of 90 slices shown]
[im 9/90  bone]
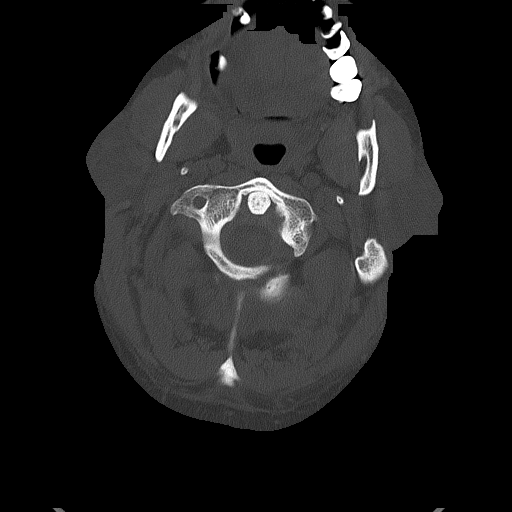
[im 18/90  bone]
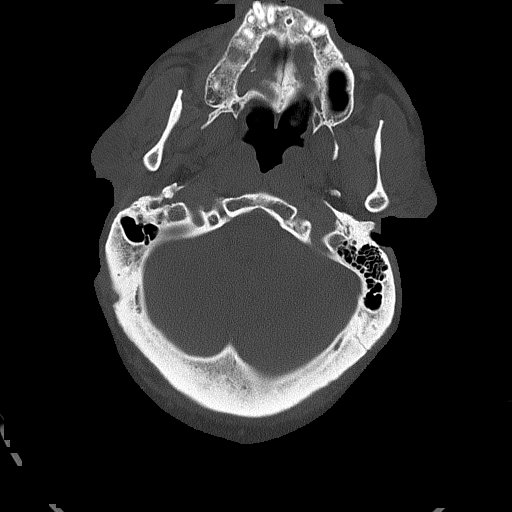
[im 27/90  bone]
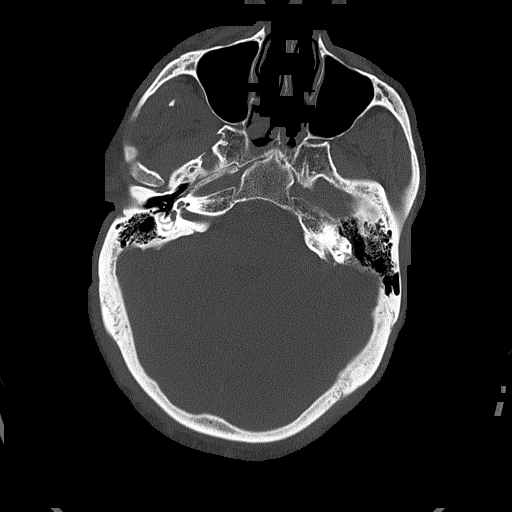
[im 41/90  bone]
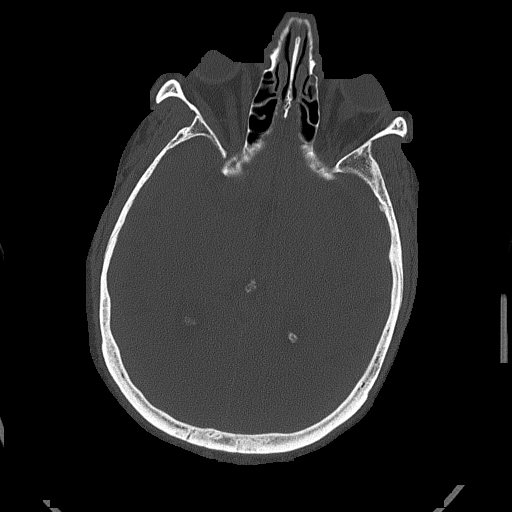

[Series 5: head without cor · coronal · non-contrast · 0.39mm/px · 3 of 80 slices shown]
[im 27/80  brain]
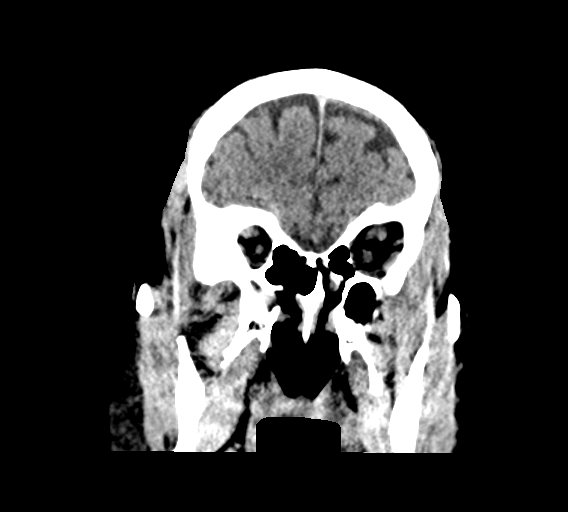
[im 36/80  brain]
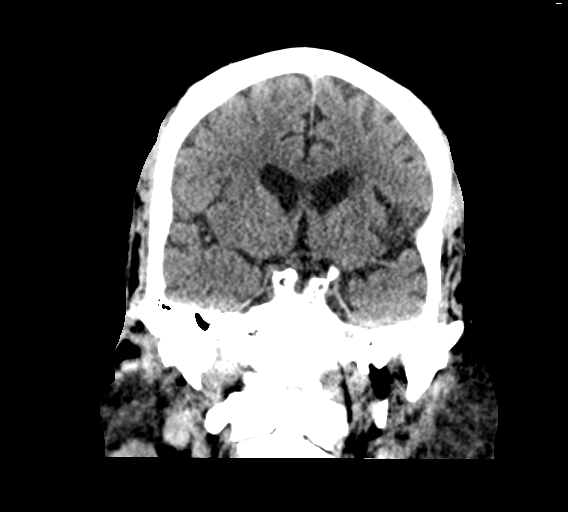
[im 44/80  brain]
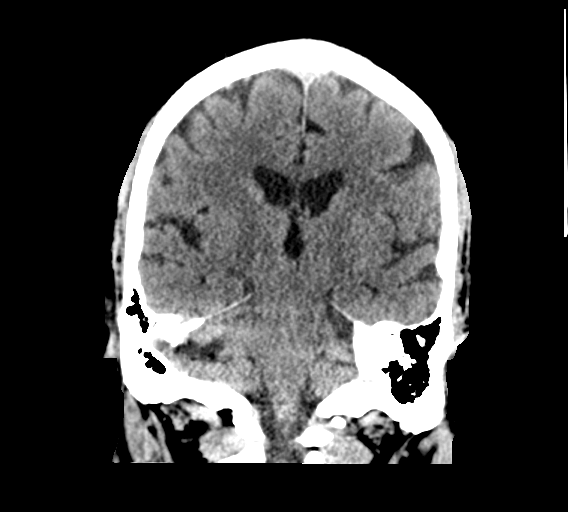

[Series 6: head without sag · sagittal · non-contrast · 0.40mm/px · 3 of 66 slices shown]
[im 22/66  brain]
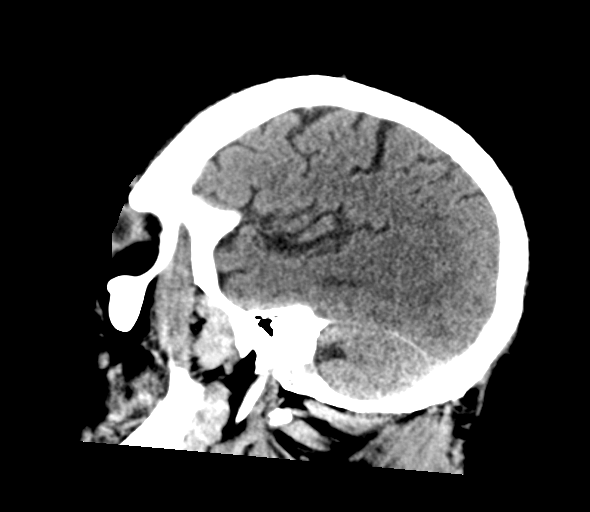
[im 33/66  brain]
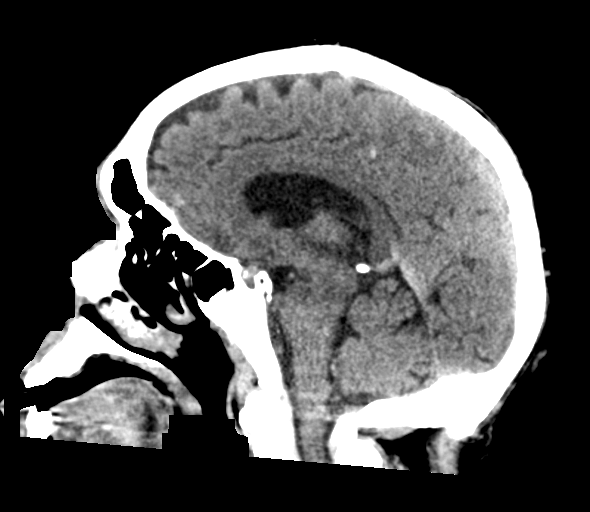
[im 44/66  brain]
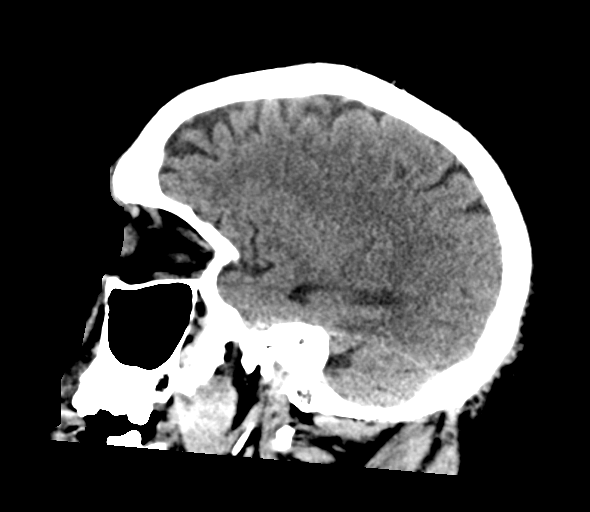

[17 of 47 positions shown; findings below may reference images not displayed]

FINDINGS: Brain: No evidence of acute infarction, hemorrhage, hydrocephalus,
extra-axial collection or mass lesion/mass effect. Mild diffuse
cerebral atrophy.

Vascular: No hyperdense vessel or unexpected calcification.

Skull: Normal. Negative for fracture or focal lesion.

Sinuses/Orbits: No acute finding.

Other: None.
IMPRESSION: No acute intracranial abnormalities.  Mild cerebral atrophy.

## 2019-01-30 ENCOUNTER — Telehealth: Payer: Self-pay | Admitting: Cardiovascular Disease

## 2019-01-30 IMAGING — DX DG ABD PORTABLE 1V
1 series · 2 of 2 positions shown · non-contrast
Comparison: 04/27/2018

CLINICAL DATA: 62-year-old male with a history of abdominal pain

EXAM:
PORTABLE ABDOMEN - 1 VIEW

[Series 1: abdomen · 0.14mm/px · 2 of 2 slices shown]
[im 1/2]
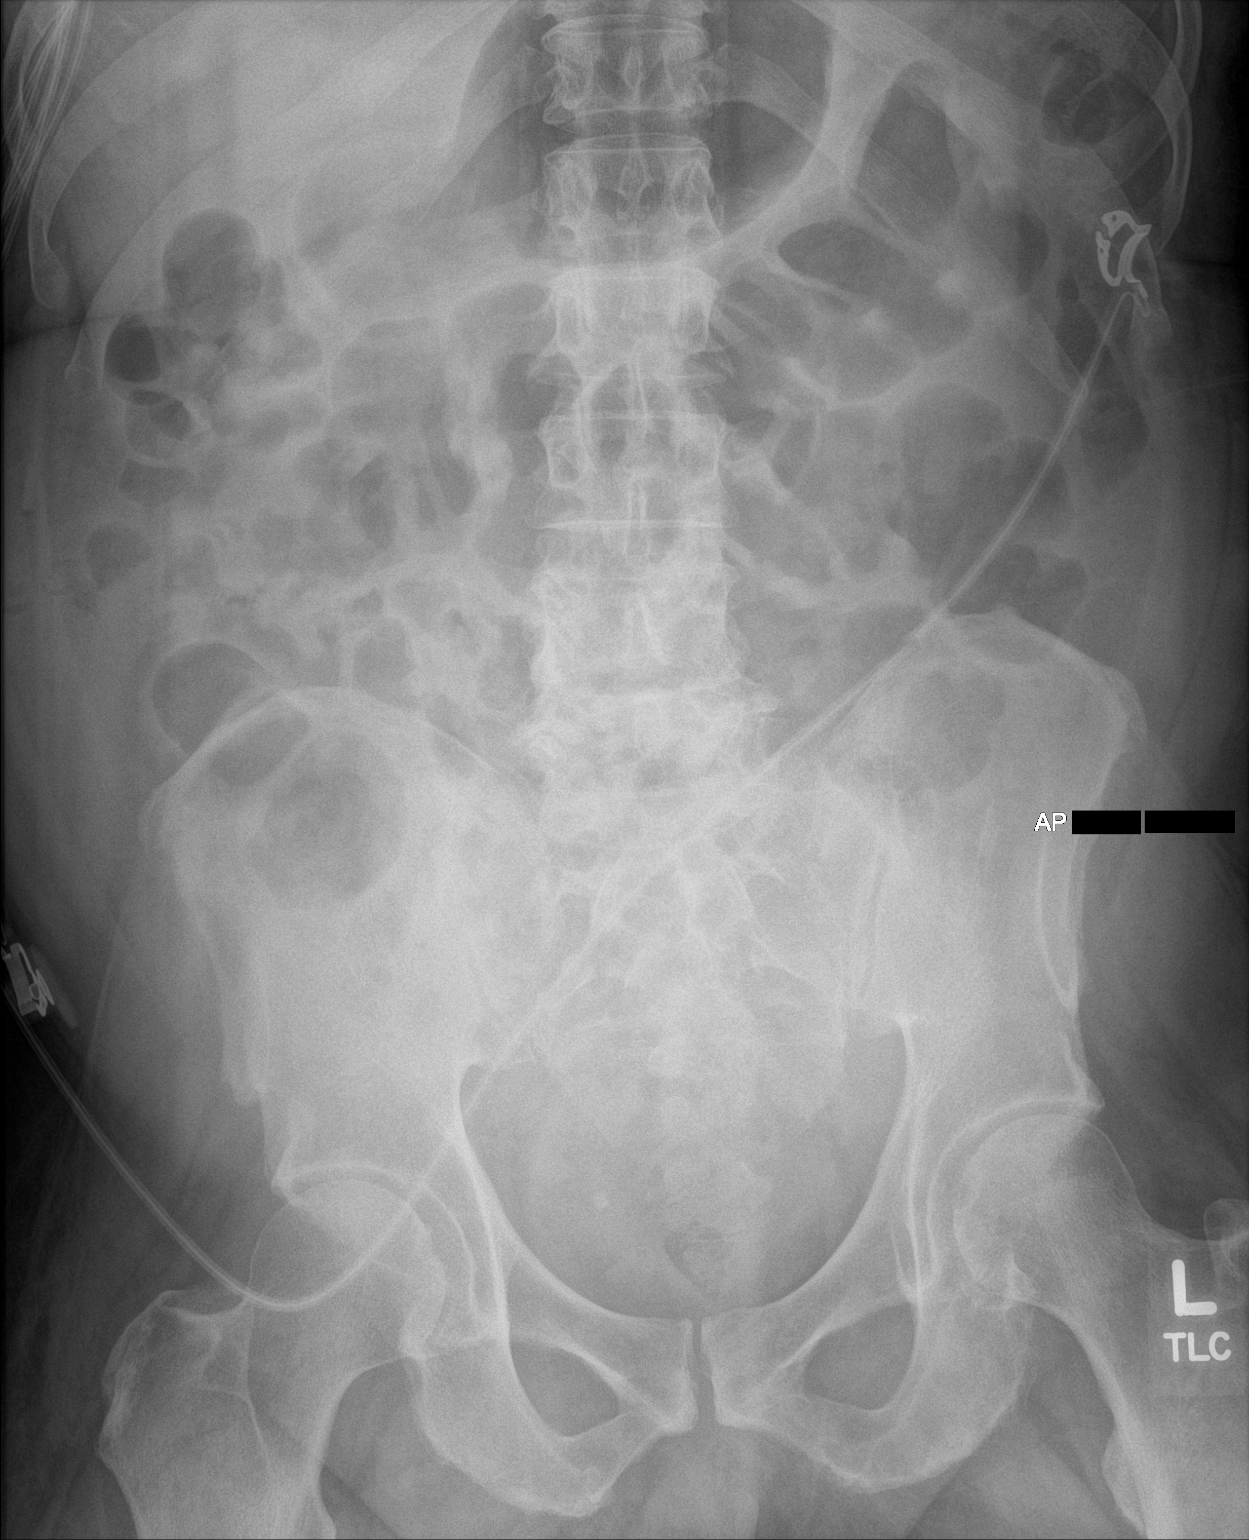
[im 2/2]
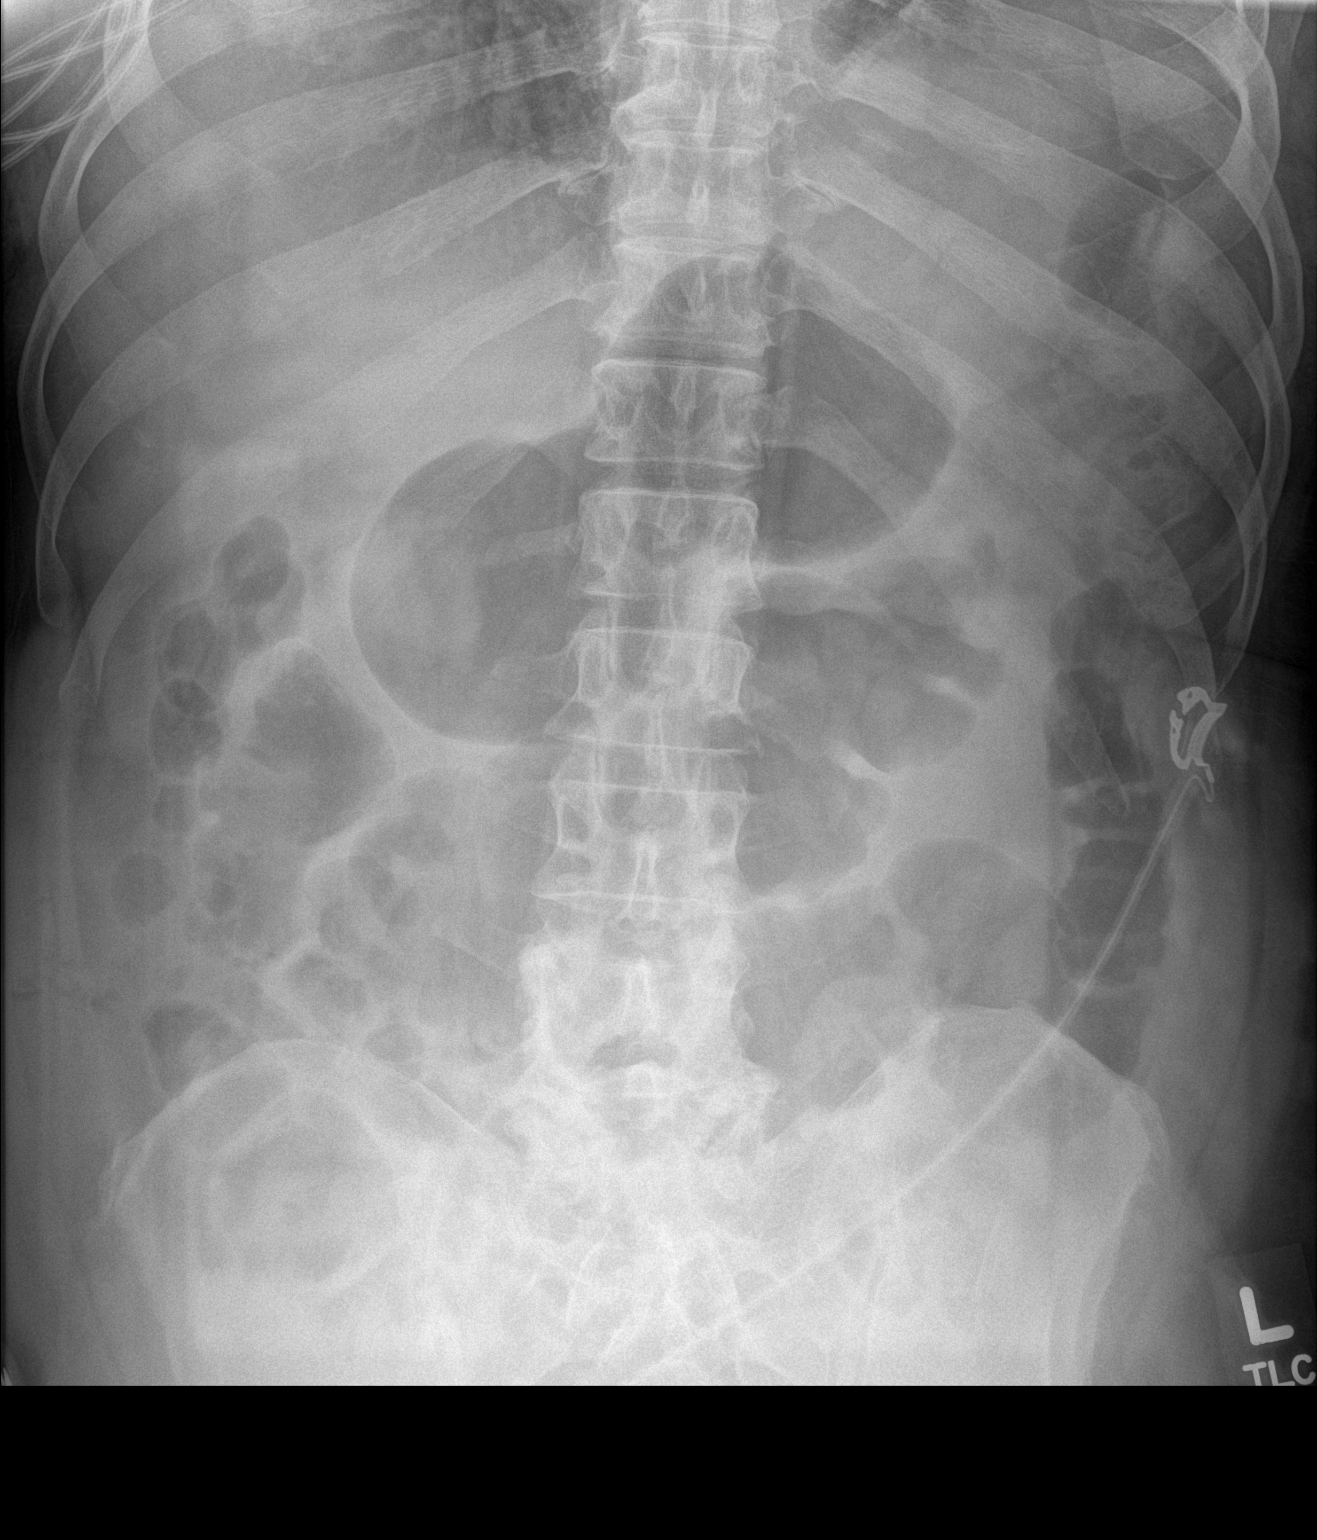

[2 of 2 positions shown; findings below may reference images not displayed]

FINDINGS: Interval removal of the gastric tube. Interval removal of
defibrillator pads.

Gas within stomach, small bowel, colon. No abnormal distention. Gas
extends to the rectum.

No unexpected radiopaque foreign body. No unexpected soft tissue
density. No unexpected calcification.

No acute displaced fracture
IMPRESSION: Nonobstructive bowel gas pattern.

## 2019-01-30 IMAGING — NM NM HEPATO W/GB/PHARM/[PERSON_NAME]
2 series · 12 of 12 positions shown · non-contrast
Comparison: None

CLINICAL DATA: RIGHT upper quadrant pain question cholecystitis

EXAM:
NUCLEAR MEDICINE HEPATOBILIARY IMAGING WITH GALLBLADDER EF
TECHNIQUE: Sequential images of the abdomen were obtained [DATE] minutes
following intravenous administration of radiopharmaceutical. After
oral ingestion of Ensure, gallbladder ejection fraction was
determined. At 60 min, normal ejection fraction is greater than 33%.
RADIOPHARMACEUTICALS:  5.0 mCi 3c-CCm  Choletec IV

[he hepatobiliary · 4.52mm/px · 6 of 60 frames shown (1 of 2)]
[frame 6/60]
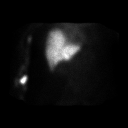
[frame 16/60]
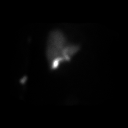
[frame 26/60]
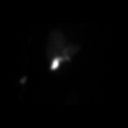
[frame 36/60]
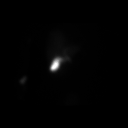
[frame 46/60]
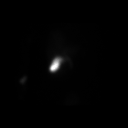
[frame 56/60]
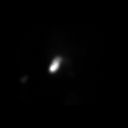

[he hepatobiliary · 4.52mm/px · 6 of 60 frames shown (2 of 2)]
[frame 6/60]
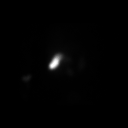
[frame 16/60]
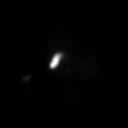
[frame 26/60]
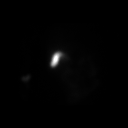
[frame 36/60]
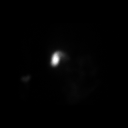
[frame 46/60]
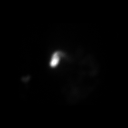
[frame 56/60]
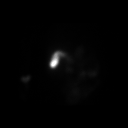

[12 of 12 positions shown; findings below may reference images not displayed]

FINDINGS: Normal tracer extraction from bloodstream indicating normal
hepatocellular function.

Normal excretion of tracer into biliary tree.

Gallbladder visualized at 10 min.

Small bowel visualized at 52 min.

No hepatic retention of tracer.

Subjectively normal emptying of tracer from gallbladder following
fatty meal stimulation.

Calculated gallbladder ejection fraction is 46%, normal.

Patient reported no symptoms following Ensure ingestion.

Normal gallbladder ejection fraction following Ensure ingestion is
greater than 33% at 1 hour.
IMPRESSION: Patent biliary tree.

Normal gallbladder response to fatty meal stimulation with a normal
gallbladder ejection fraction of 46%.

## 2019-01-30 NOTE — Telephone Encounter (Signed)
Called patient about the message we received. Patient stated he had chest pain and had fallen in March. Patient stated he ended up having a stroke with a blood clot in his head. Patient stated he is doing good today, and has no deficits from the stroke. Patient agreed to a telephone visit with Dr. Eden Emms on Tuesday.       Virtual Visit Pre-Appointment Phone Call  Steps For Call:  1. Confirm consent - "In the setting of the current Covid19 crisis, you are scheduled for a (phone or video) visit with your provider on (date) at (time).  Just as we do with many in-office visits, in order for you to participate in this visit, we must obtain consent.  If you'd like, I can send this to your mychart (if signed up) or email for you to review.  Otherwise, I can obtain your verbal consent now.  All virtual visits are billed to your insurance company just like a normal visit would be.  By agreeing to a virtual visit, we'd like you to understand that the technology does not allow for your provider to perform an examination, and thus may limit your provider's ability to fully assess your condition.  Finally, though the technology is pretty good, we cannot assure that it will always work on either your or our end, and in the setting of a video visit, we may have to convert it to a phone-only visit.  In either situation, we cannot ensure that we have a secure connection.  Are you willing to proceed?"  2. Give patient instructions for WebEx download to smartphone as below if video visit  3. Advise patient to be prepared with any vital sign or heart rhythm information, their current medicines, and a piece of paper and pen handy for any instructions they may receive the day of their visit  4. Inform patient they will receive a phone call 15 minutes prior to their appointment time (may be from unknown caller ID) so they should be prepared to answer  5. Confirm that appointment type is correct in Epic appointment notes  (video vs telephone)    TELEPHONE CALL NOTE  Russell Carter has been deemed a candidate for a follow-up tele-health visit to limit community exposure during the Covid-19 pandemic. I spoke with the patient via phone to ensure availability of phone/video source, confirm preferred email & phone number, and discuss instructions and expectations.  I reminded Russell Carter to be prepared with any vital sign and/or heart rhythm information that could potentially be obtained via home monitoring, at the time of his visit. I reminded Russell Carter to expect a phone call at the time of his visit if his visit.  Did the patient verbally acknowledge consent to treatment? yes  Ethelda Chick, RN 01/30/2019 2:11 PM   CONSENT FOR TELE-HEALTH VISIT - PLEASE REVIEW  I hereby voluntarily request, consent and authorize [facility] and its employed or contracted physicians, physician assistants, nurse practitioners or other licensed health care professionals (the Practitioner), to provide me with telemedicine health care services (the "Services") as deemed necessary by the treating Practitioner. I acknowledge and consent to receive the Services by the Practitioner via telemedicine. I understand that the telemedicine visit will involve communicating with the Practitioner through live audiovisual communication technology and the disclosure of certain medical information by electronic transmission. I acknowledge that I have been given the opportunity to request an in-person assessment or other available alternative prior to the telemedicine visit and am  voluntarily participating in the telemedicine visit.  I understand that I have the right to withhold or withdraw my consent to the use of telemedicine in the course of my care at any time, without affecting my right to future care or treatment, and that the Practitioner or I may terminate the telemedicine visit at any time. I understand that I have the right to  inspect all information obtained and/or recorded in the course of the telemedicine visit and may receive copies of available information for a reasonable fee.  I understand that some of the potential risks of receiving the Services via telemedicine include:  Marland Kitchen Delay or interruption in medical evaluation due to technological equipment failure or disruption; . Information transmitted may not be sufficient (e.g. poor resolution of images) to allow for appropriate medical decision making by the Practitioner; and/or  . In rare instances, security protocols could fail, causing a breach of personal health information.  Furthermore, I acknowledge that it is my responsibility to provide information about my medical history, conditions and care that is complete and accurate to the best of my ability. I acknowledge that Practitioner's advice, recommendations, and/or decision may be based on factors not within their control, such as incomplete or inaccurate data provided by me or distortions of diagnostic images or specimens that may result from electronic transmissions. I understand that the practice of medicine is not an exact science and that Practitioner makes no warranties or guarantees regarding treatment outcomes. I acknowledge that I will receive a copy of this consent concurrently upon execution via email to the email address I last provided but may also request a printed copy by calling the office of [facility].    I understand that my insurance will be billed for this visit.   I have read or had this consent read to me. . I understand the contents of this consent, which adequately explains the benefits and risks of the Services being provided via telemedicine.  . I have been provided ample opportunity to ask questions regarding this consent and the Services and have had my questions answered to my satisfaction. . I give my informed consent for the services to be provided through the use of  telemedicine in my medical care  By participating in this telemedicine visit I agree to the above.

## 2019-01-30 NOTE — Progress Notes (Signed)
Virtual Visit via Telephone Note   This visit type was conducted due to national recommendations for restrictions regarding the COVID-19 Pandemic (e.g. social distancing) in an effort to limit this patient's exposure and mitigate transmission in our community.  Due to his co-morbid illnesses, this patient is at least at moderate risk for complications without adequate follow up.  This format is felt to be most appropriate for this patient at this time.  The patient did not have access to video technology/had technical difficulties with video requiring transitioning to audio format only (telephone).  All issues noted in this document were discussed and addressed.  No physical exam could be performed with this format.  Please refer to the patient's chart for his  consent to telehealth for [facility].   Evaluation Performed:  Follow-up visit  Date:  01/30/2019   ID:  Russell DalesMichael Carter, DOB 1956/01/28, MRN [id number]  Patient Location: Home  Provider Location: Office  PCP:  [name], [name], MD  Cardiologist:  Charlton HawsPeter Karliah Kowalchuk, MD   Electrophysiologist:  None   Chief Complaint:  Post hospital stroke   History of Present Illness:    63 y.o. history of HTN, CAD, CVA, PE. On coumadin followed by health and wellness. Requires lovenox bridging. Had DES to RCA in May 2017 Stent was patent by cath 04/29/18 with no new left sided disease. EF normal Has had C3-T1 laminectomy with partial LUE paralysis after injection at neurosurgeon's office His coumadin is followed by health and wellness History of cocaine and ETOH abuse Frequently runs of medicine   Admitted by neurology 12/25/18 more left sided weakness. TPA given CT/MRI negative Echo Normal EF no SOE EEG negative INR subRx on admission at 1.7  No angina needs new nitro   The patient does not have symptoms concerning for COVID-19 infection (fever, chills, cough, or new shortness of breath).    Past Medical History:  Diagnosis Date  . Acute kidney  injury (HCC) 04/27/2018  . AKI (acute kidney injury) (HCC)   . Anginal pain (HCC)   . Anxiety   . Arthritis   . Bipolar disorder (HCC)   . Blood in stool   . CAD (coronary artery disease)    a. 2011: cath showing mod non-obstructive disease b. 02/2016: cath with 95% stenosis in the Mid RCA. DES placed.  . Cervical stenosis of spine   . Chronic anticoagulation 04/27/2018  . Chronic pain disorder 06/05/2017  . CKD (chronic kidney disease), stage III (HCC) 05/06/2018  . Cocaine abuse (HCC)    history  . Complete heart block (HCC) 03/14/2016  . Depression   . Diabetes mellitus type 2 in nonobese (HCC)   . Diabetes mellitus without complication (HCC)   . Dyslipidemia 03/15/2016  . Essential hypertension 05/18/2009   Qualifier: Diagnosis of  By: Kem ParkinsonBarnes, Kimalexis    . Gastroesophageal reflux disease without esophagitis 07/31/2017  . Gout, unspecified   . Headache   . History of CVA (cerebrovascular accident)   . History of pulmonary embolus (PE) 02/14/2017  . HTN (hypertension)   . Hypercholesterolemia   . Hyperglycemia   . Leukopenia 05/06/2018  . Migraines   . Myocardial infarction [facility](HCC) 2008   drug cocaine and marijuana at time of mi none  since    . Normocytic anemia 05/06/2018  . Pneumonia   . Polysubstance abuse (HCC) 01/15/2013  . Rectal bleeding   . Renal insufficiency   . Seizure (HCC) 05/06/2018  . Seizures (HCC)   . Stroke [facility](HCC)  Past Surgical History:  Procedure Laterality Date  . ANTERIOR CERVICAL DECOMP/DISCECTOMY FUSION N/A 01/24/2017   Procedure: Cervical Four-Five,Cervical Five Six,Cervical Six-Seven Anterior cervical discectomy with fusion and plate with Cervical Three-Thoracic- One Laminectomy/Foraminotomy with Cervical Three-Thoracic- One Fixation and fusion;  Surgeon: Loura Halt Ditty, MD;  Location: [facility] OR;  Service: Neurosurgery;  Laterality: N/A;  Part-1 anterior approach  . CARDIAC CATHETERIZATION  2008; ~ 2011   Gideon-40%lad  . CARDIAC CATHETERIZATION N/A  03/16/2016   Procedure: Left Heart Cath and Coronary Angiography;  Surgeon: Marykay Lex, MD;  Location: [facility] INVASIVE CV LAB;  Service: Cardiovascular;  Laterality: N/A;  . COLONOSCOPY WITH PROPOFOL N/A 04/17/2018   Procedure: COLONOSCOPY WITH PROPOFOL;  Surgeon: Graylin Shiver, MD;  Location: [facility] ENDOSCOPY;  Service: Endoscopy;  Laterality: N/A;  . CORONARY ANGIOPLASTY     STENTS  . EYE SURGERY Bilateral    "for double vision"  . INGUINAL HERNIA REPAIR  05/12/2012   Procedure: HERNIA REPAIR INGUINAL ADULT;  Surgeon: Liz Malady, MD;  Location: [facility];  Service: General;  Laterality: Right;  . INGUINAL HERNIA REPAIR Left 02/02/2003   Dr. Violeta Gelinas. repair with mesh. Same procedure done again in  05/22/2004   . INGUINAL HERNIA REPAIR Left ?date 2nd OR  . LEFT HEART CATH AND CORONARY ANGIOGRAPHY N/A 04/29/2018   Procedure: LEFT HEART CATH AND CORONARY ANGIOGRAPHY;  Surgeon: [place], Adonnis Salceda M, MD;  Location: [facility] INVASIVE CV LAB;  Service: Cardiovascular;  Laterality: N/A;  . POSTERIOR CERVICAL FUSION/FORAMINOTOMY N/A 01/24/2017   Procedure: Cervical Three-Thoracic One-Laminectomy/Foraminotomy with Cervical Three-Thoracic One Fixation and fusion;  Surgeon: Loura Halt Ditty, MD;  Location: [facility] OR;  Service: Neurosurgery;  Laterality: N/A;  Part-2 Posterior approach  . RECONSTRUCT / STABILIZE DISTAL ULNA    . SHOULDER OPEN ROTATOR CUFF REPAIR Left 2010  . SHOULDER SURGERY Left    "injured on the job; had to cut small bone out"  . THUMB FUSION Right      No outpatient medications have been marked as taking for the 02/03/19 encounter (Appointment) with Wendall Stade, MD.     Allergies:   Shellfish allergy   Social History   Tobacco Use  . Smoking status: Never Smoker  . Smokeless tobacco: Never Used  Substance Use Topics  . Alcohol use: Yes    Comment: 16 oz beer every 2-3 days  . Drug use: Yes    Types: "Crack" cocaine, Marijuana, Cocaine    Comment:  "quit 2008"      Family Hx: The patient's family history includes Bone cancer in his brother; Cancer in his brother and brother; Heart attack in his brother, brother, father, and mother; Other in his brother and brother; SIDS in his sister.  ROS:   Please see the history of present illness.     All other systems reviewed and are negative.   Prior CV studies:   The following studies were reviewed today:  Admission 12/23/18 MRI, CT, EEG Echo Cath 04/29/18   Labs/Other Tests and Data Reviewed:    EKG:  SR rate 82 normal 01/02/19  Recent Labs: 04/27/2018: TSH 1.057 04/28/2018: Magnesium 2.0 01/02/2019: Hemoglobin 16.0; Platelets 208 01/20/2019: ALT 31; BUN 14; Creatinine, Ser 1.29; Potassium 4.2; Sodium 140   Recent Lipid Panel Lab Results  Component Value Date/Time   CHOL 162 12/25/2018 08:01 AM   CHOL 165 06/26/2018 08:42 AM   TRIG 351 (H) 12/25/2018 08:01 AM   HDL 45 12/25/2018 08:01 AM  HDL 50 06/26/2018 08:42 AM   CHOLHDL 3.6 12/25/2018 08:01 AM   LDLCALC 47 12/25/2018 08:01 AM   LDLCALC 89 06/26/2018 08:42 AM    Wt Readings from Last 3 Encounters:  01/20/19 98.5 kg  01/02/19 98 kg  12/24/18 98.2 kg     Objective:    Vital Signs:  There were no vitals taken for this visit.   Well nourished, well developed male in no acute distress. Skin warm and dry  No tachypnea No JVP elevation LUE weakness No edema  ASSESSMENT & PLAN:    1. CAD:  DES to RCA patent by cath 04/29/18 continue Plavix given need for anticoagulation 2. Stroke per neurology residual left sided weakness coumadin followed by health and wellness 3. HLD:  At goal on statin  4. HTN:  Well controlled.  Continue current medications and low sodium Dash type diet.     COVID-19 Education: The signs and symptoms of COVID-19 were discussed with the patient and how to seek care for testing (follow up with PCP or arrange E-visit).  The importance of social distancing was discussed today.  Time:   Today, I have spent 30  minutes with the patient with telehealth technology discussing the above problems.     Medication Adjustments/Labs and Tests Ordered: Current medicines are reviewed at length with the patient today.  Concerns regarding medicines are outlined above.  Tests Ordered: No orders of the defined types were placed in this encounter.  Medication Changes: No orders of the defined types were placed in this encounter.   Disposition:  Follow up in a year  Signed, Charlton Haws, MD  01/30/2019 12:50 PM    [facility]

## 2019-01-30 NOTE — Telephone Encounter (Signed)
New message      Telephone visit scheduled for 02-03-19 at 9:45.  Pt was not willing to do a video visit, but did want to follow up with doctor because he had issues in march.

## 2019-02-01 IMAGING — CT CT ABD-PELV W/ CM
2 of 5 series · 15 of 46 positions shown, 17 images · IV contrast (Omni 300)
Comparison: Noncontrast CT 04/27/2018.

CLINICAL DATA: Abdominal pain and bradycardia. Worsening chronic
kidney disease with metabolic acidosis.

EXAM:
CT ABDOMEN AND PELVIS WITH CONTRAST
TECHNIQUE: Multidetector CT imaging of the abdomen and pelvis was performed
using the standard protocol following bolus administration of
intravenous contrast.
CONTRAST:  100mL OMNIPAQUE IOHEXOL 300 MG/ML  SOLN

[Series 3: a/p w/ 5mm · axial · 0.96mm/px · z∈[+844,+1314]mm · 12 of 108 slices shown, 14 images]
[im 7/108  soft-tissue]
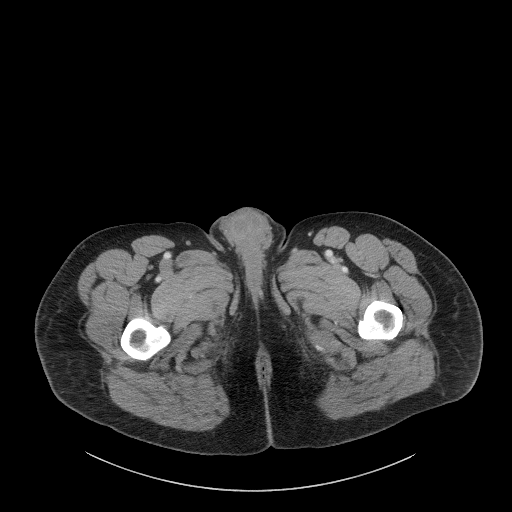
[im 7/108  bone]
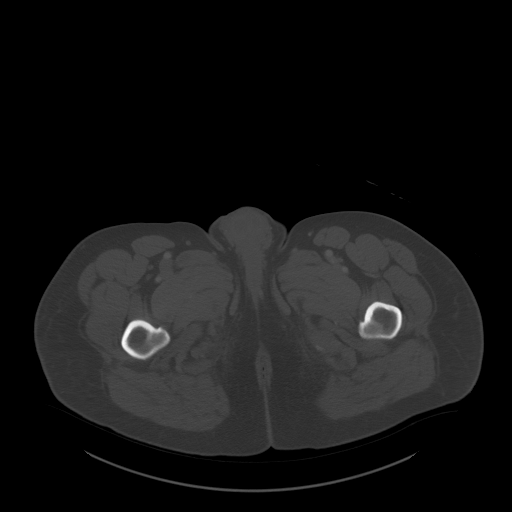
[im 19/108  soft-tissue]
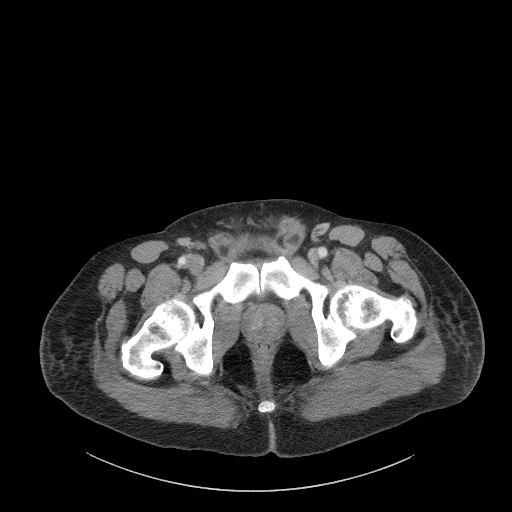
[im 26/108  soft-tissue]
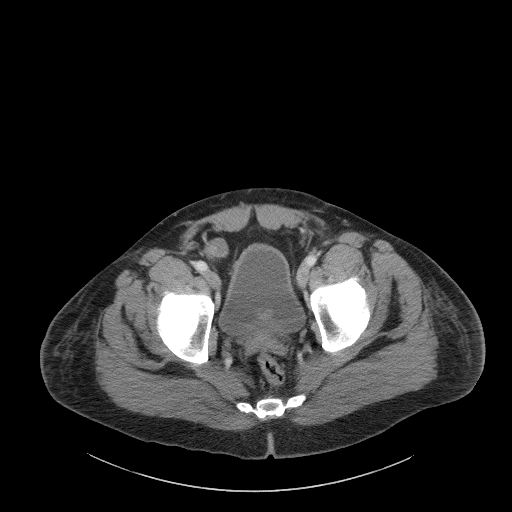
[im 32/108  soft-tissue]
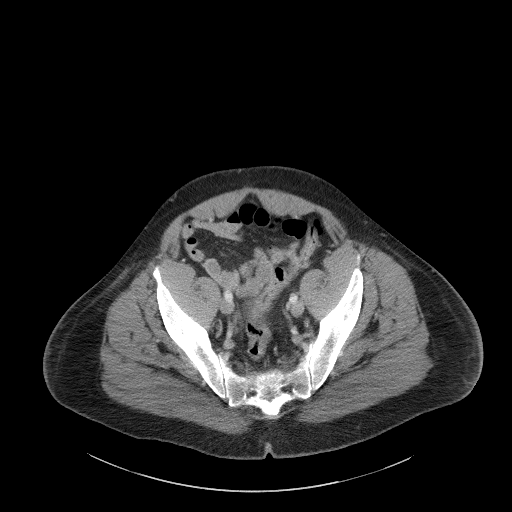
[im 45/108  soft-tissue]
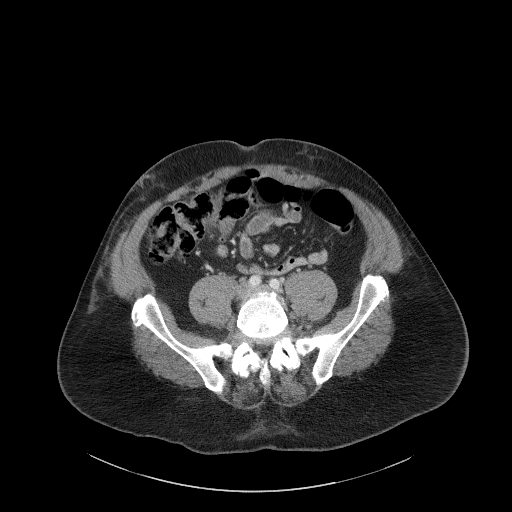
[im 51/108  soft-tissue]
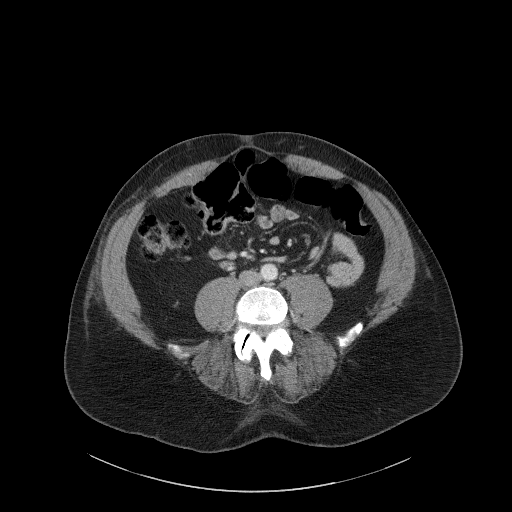
[im 57/108  soft-tissue]
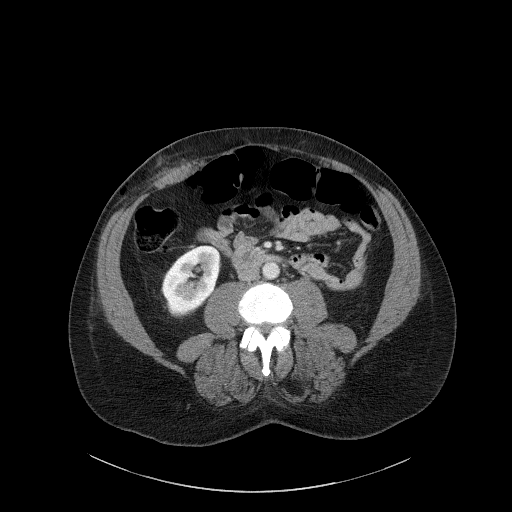
[im 70/108  soft-tissue]
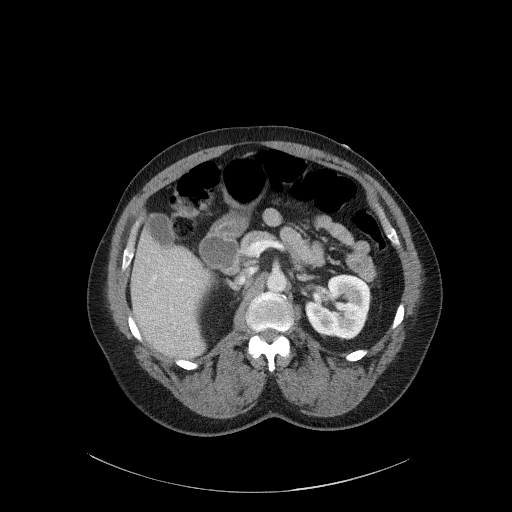
[im 76/108  soft-tissue]
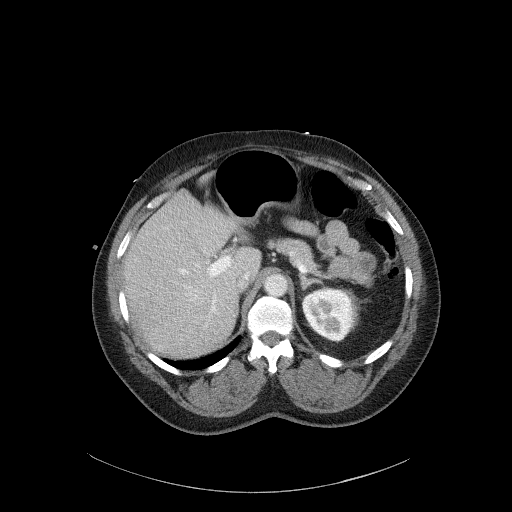
[im 76/108  bone]
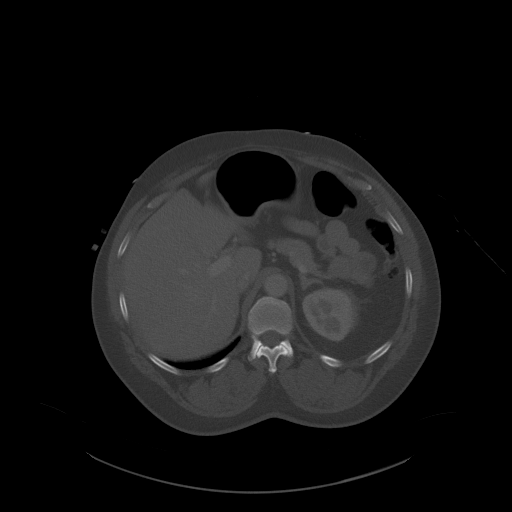
[im 82/108  soft-tissue]
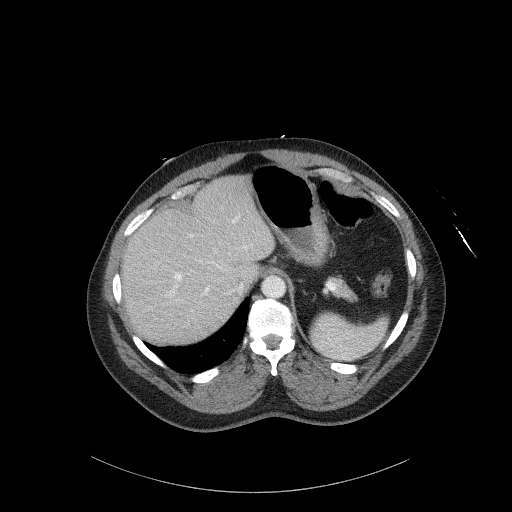
[im 95/108  soft-tissue]
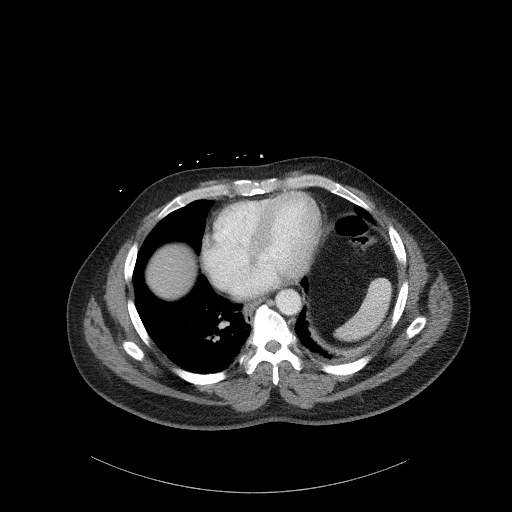
[im 101/108  soft-tissue]
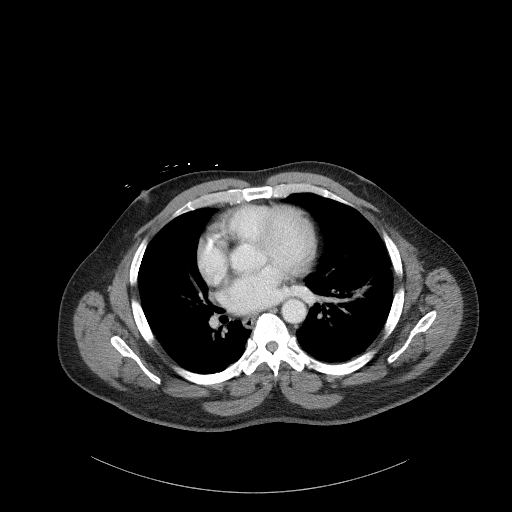

[Series 6: a/p w/ cor · coronal · 0.75mm/px · 3 of 176 slices shown]
[im 59/176  soft-tissue]
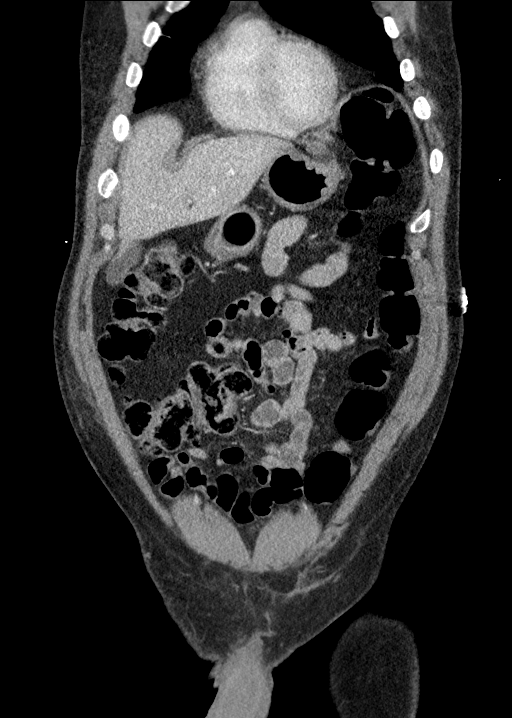
[im 78/176  soft-tissue]
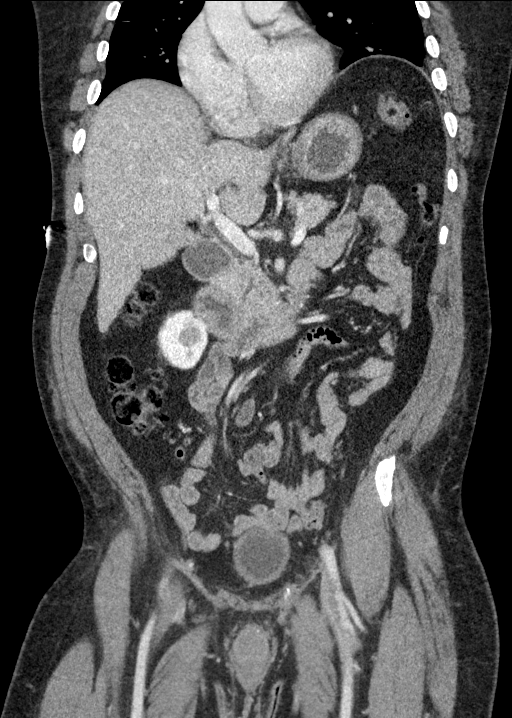
[im 98/176  soft-tissue]
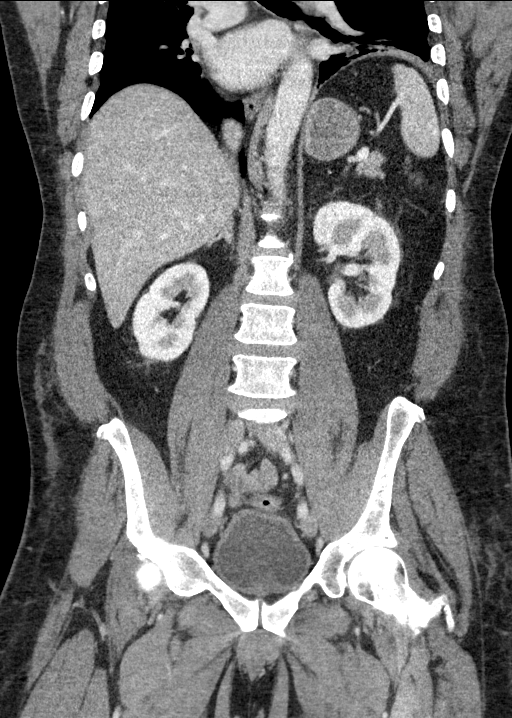

[15 of 46 positions shown; findings below may reference images not displayed]

FINDINGS: Lower chest: Mild atelectasis at both lung bases, similar to
previous study. There is trace pleural fluid on the left. There is
no pericardial effusion. Coronary artery calcifications and possible
aortic valvular calcifications are noted.

Hepatobiliary: The liver is normal in density without focal
abnormality. No evidence of gallstones, gallbladder wall thickening
or biliary dilatation.

Pancreas: Unremarkable. No pancreatic ductal dilatation or
surrounding inflammatory changes.

Spleen: Normal in size without focal abnormality.

Adrenals/Urinary Tract: Both adrenal glands appear normal. There are
small cysts in the lower pole of the right kidney. No evidence of
renal mass, urinary tract calculus or hydronephrosis. No significant
bladder findings.

Stomach/Bowel: No evidence of bowel wall thickening, distention or
surrounding inflammatory change. The appendix appears normal.

Vascular/Lymphatic: There are no enlarged abdominal or pelvic lymph
nodes. Small inguinal lymph nodes are stable. Minimal iliac
atherosclerosis without acute vascular findings.

Reproductive: The prostate gland appears stable.

Other: Interval mild left hemidiaphragm elevation. There are stable
sequela of probable subcutaneous injections within the subcutaneous
fat of the anterior abdominal wall and stable probable postsurgical
changes in the left inguinal region. No recurrent hernia seen.

Musculoskeletal: No acute or significant osseous findings. Prominent
facet hypertrophy in the lower lumbar spine.
IMPRESSION: 1. No acute findings or explanation for the patient's symptoms.
2. Interval increased left hemidiaphragm elevation with similar
atelectasis at both lung bases.
3. Stable findings in the subcutaneous fat of the anterior abdominal
wall, attributed to subcutaneous injections.
4. Coronary artery atherosclerosis.

## 2019-02-03 ENCOUNTER — Other Ambulatory Visit: Payer: Self-pay

## 2019-02-03 ENCOUNTER — Telehealth (INDEPENDENT_AMBULATORY_CARE_PROVIDER_SITE_OTHER): Payer: Medicaid Other | Admitting: Cardiovascular Disease

## 2019-02-03 ENCOUNTER — Encounter: Payer: Self-pay | Admitting: Cardiovascular Disease

## 2019-02-03 VITALS — BP 124/88 | Ht 71.0 in | Wt 206.0 lb

## 2019-02-03 DIAGNOSIS — I2510 Atherosclerotic heart disease of native coronary artery without angina pectoris: Secondary | ICD-10-CM

## 2019-02-03 MED ORDER — NITROGLYCERIN 0.4 MG SL SUBL: 0.4000 mg | SUBLINGUAL_TABLET | SUBLINGUAL | 4 refills | Status: DC | PRN

## 2019-02-03 NOTE — Patient Instructions (Signed)

## 2019-02-06 IMAGING — DX DG CHEST 1V PORT
1 series · 1 of 1 positions shown · non-contrast
Comparison: 05/15/2017.

CLINICAL DATA: Weakness, shortness of breath and abdominal pain.
Lethargy.

EXAM:
PORTABLE CHEST 1 VIEW

[chest ap]
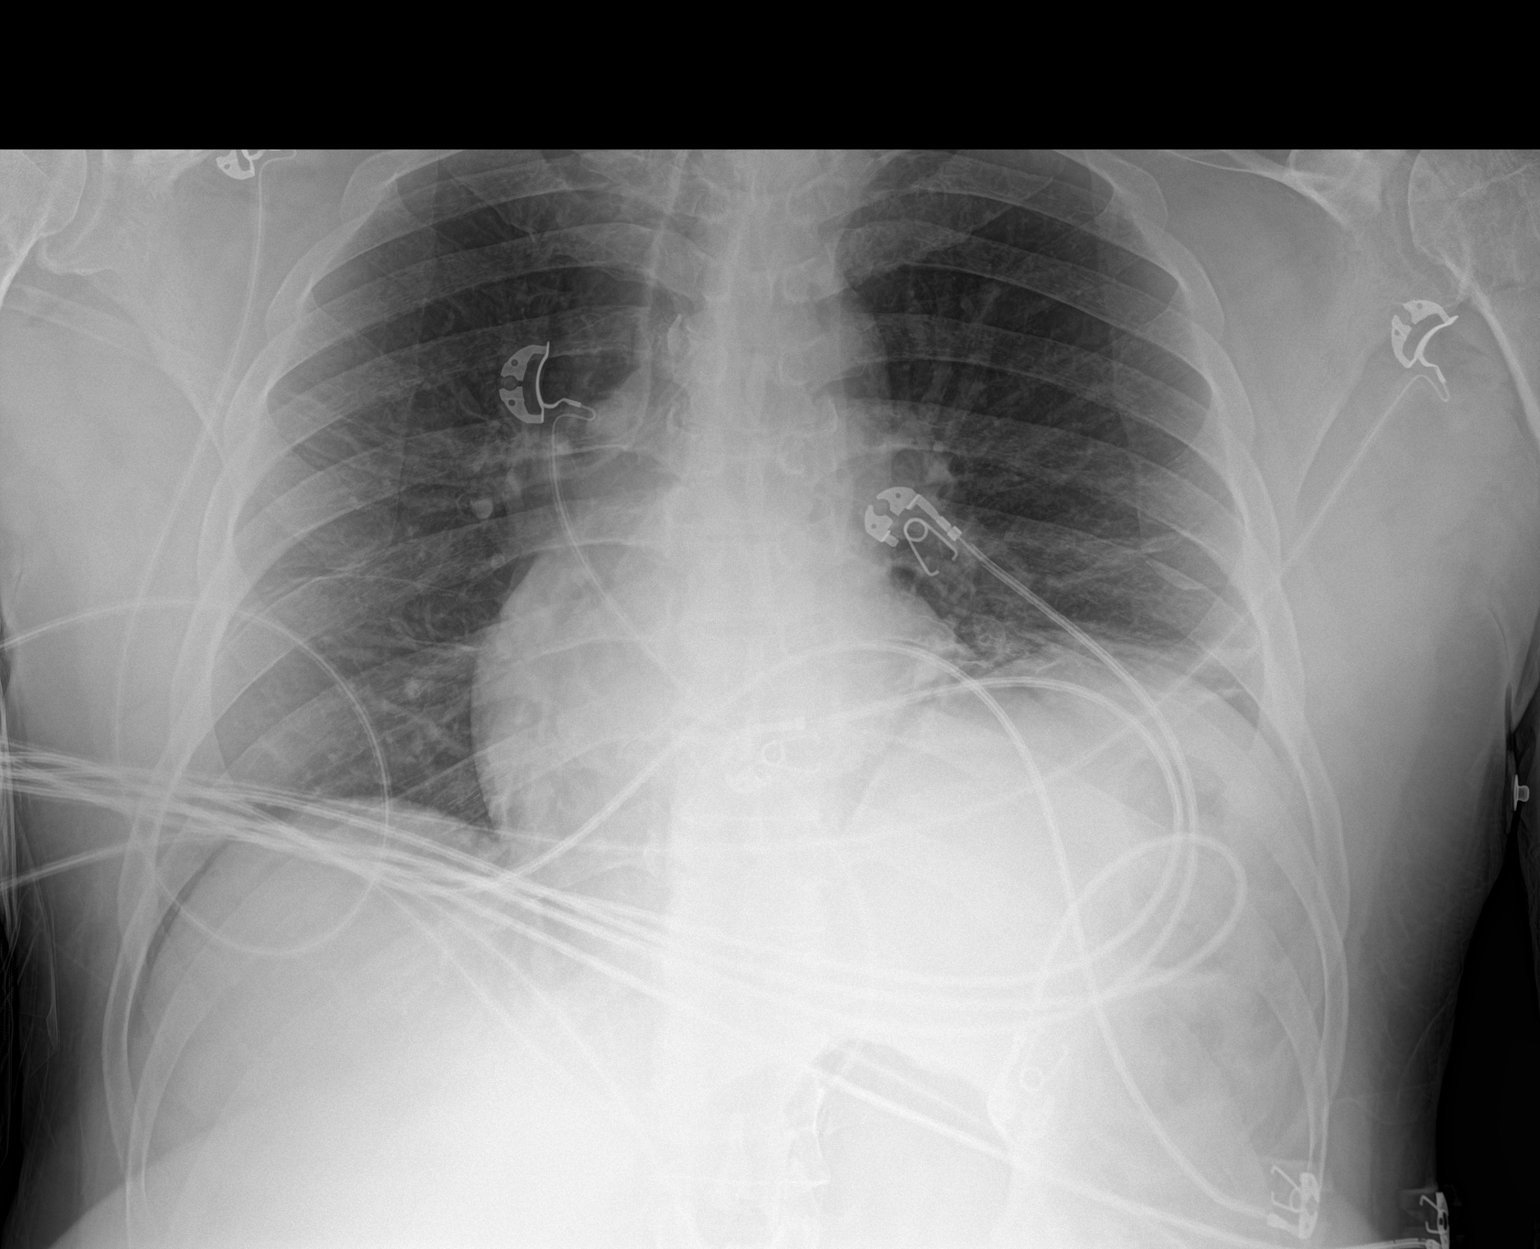

[1 of 1 positions shown; findings below may reference images not displayed]

FINDINGS: Chronic LEFT hemidiaphragm elevation. Normal heart size. Chronic
hypoaeration at the LEFT base. No consolidation or edema. No
effusion or pneumothorax. Previous cervical spine surgery.
IMPRESSION: No active disease.

## 2019-02-07 IMAGING — DX DG ABDOMEN 1V
2 series · 2 of 2 positions shown · non-contrast
Comparison: CT abdomen and pelvis 04/30/2018

CLINICAL DATA: Abdominal pain and distention.

EXAM:
ABDOMEN - 1 VIEW

[abdomen kub (1 of 2)]
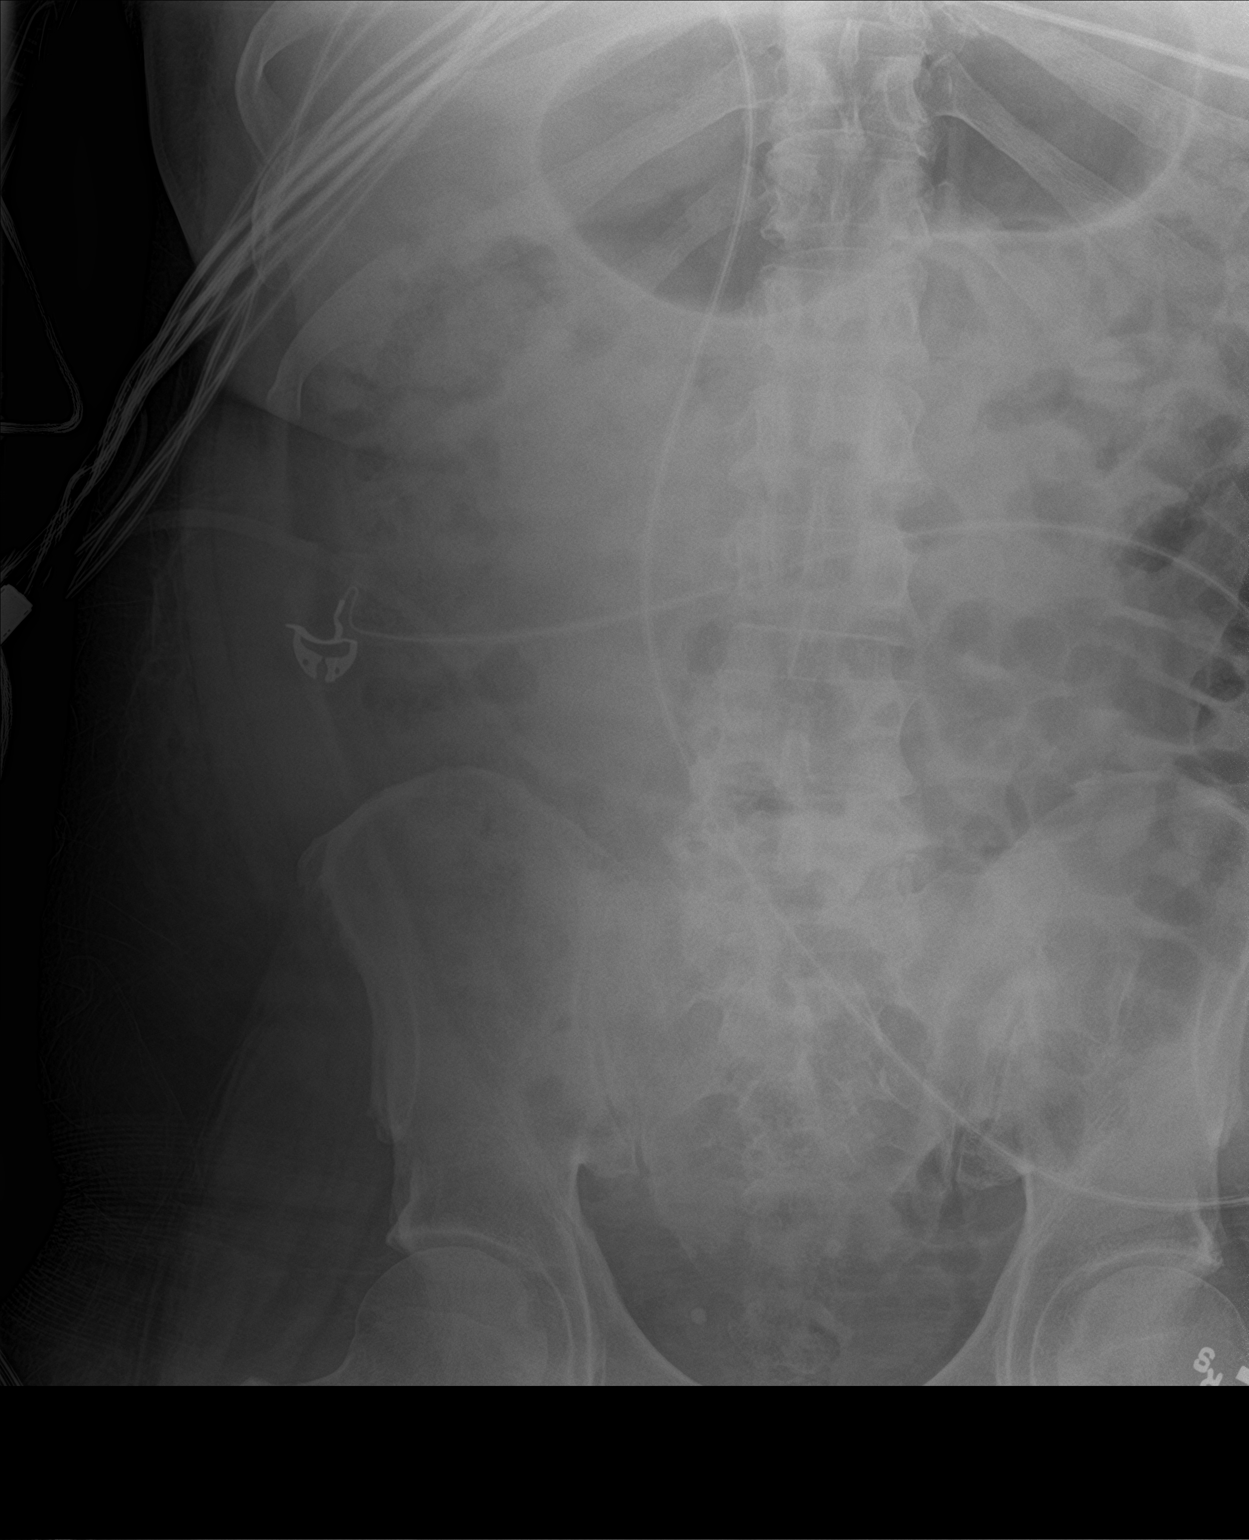

[abdomen kub (2 of 2)]
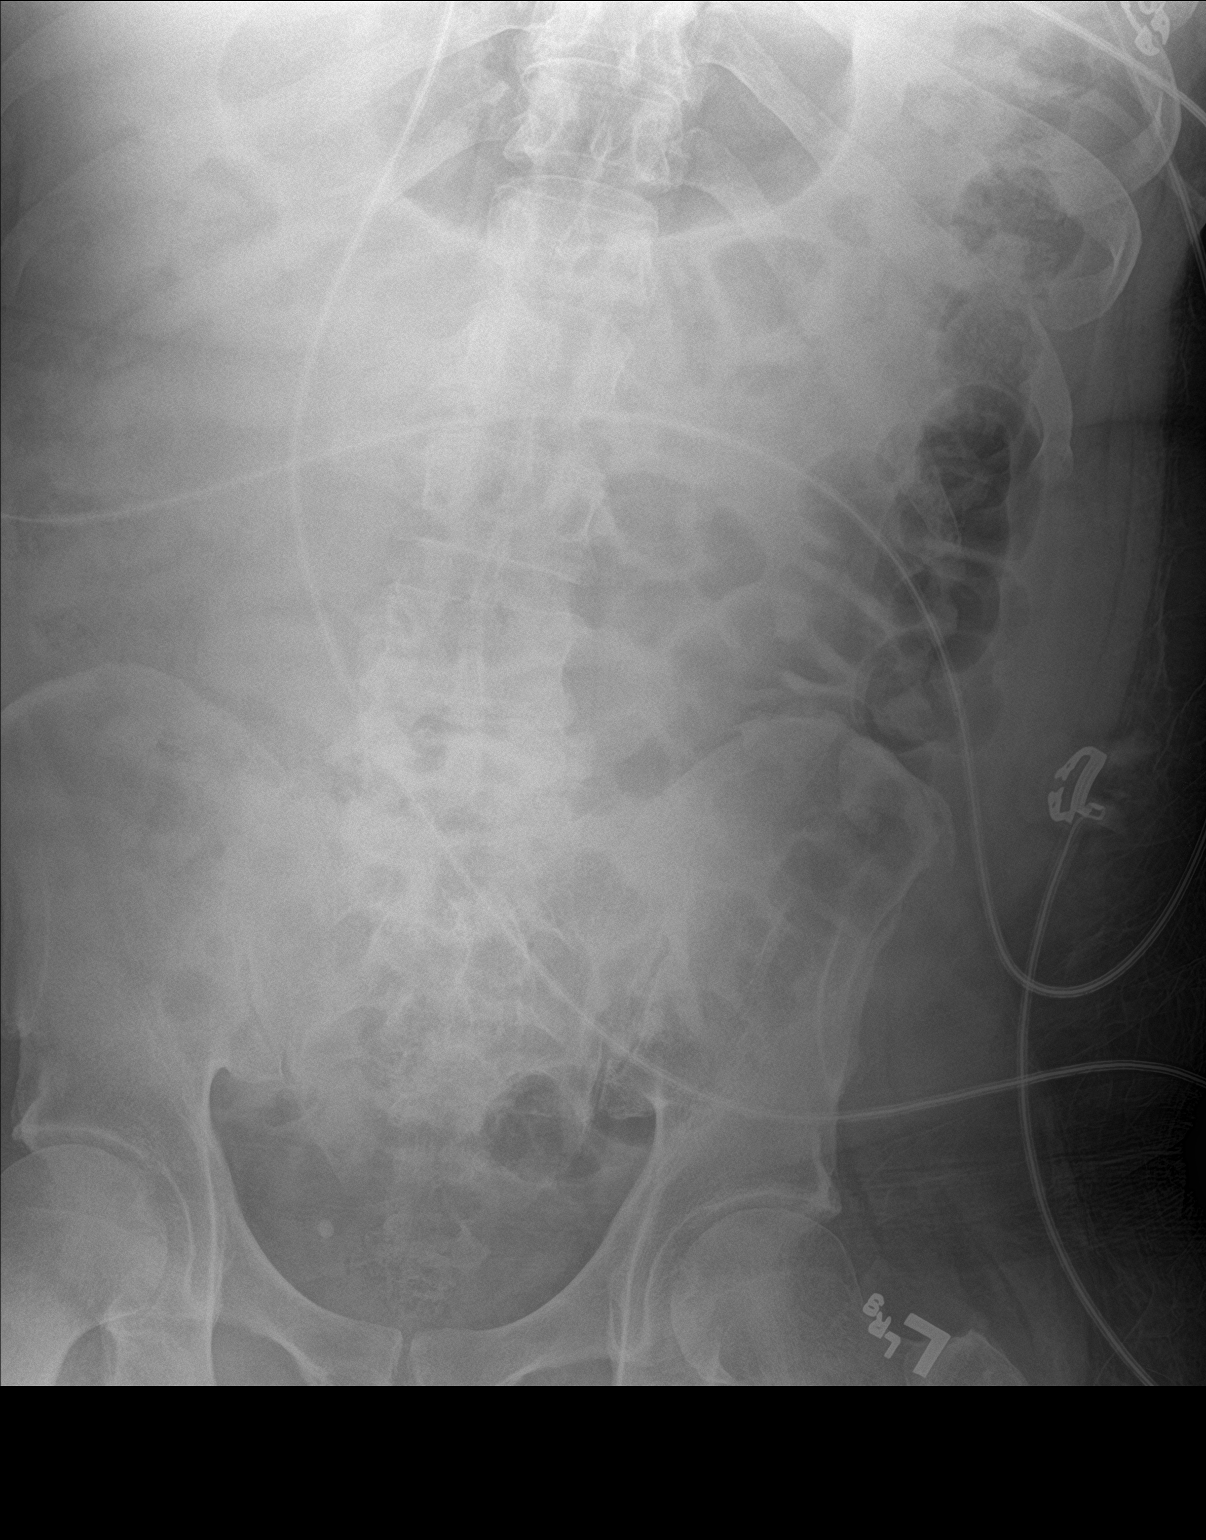

[2 of 2 positions shown; findings below may reference images not displayed]

FINDINGS: Moderate gaseous distention of the stomach. Nonobstructed,
nondistended bowel gas pattern with scattered air containing small
large bowel loops noted. No free air is seen. Lower lumbar facet
arthropathy is seen at L4-5 and L5-S1. No acute osseous abnormality.
Stable phleboliths right hemipelvis. No organomegaly.
IMPRESSION: Apart from moderate gaseous distention of the stomach, the bowel gas
pattern is unremarkable. There is no free air.

## 2019-02-18 ENCOUNTER — Other Ambulatory Visit: Payer: Self-pay | Admitting: Pharmacist

## 2019-02-18 ENCOUNTER — Other Ambulatory Visit: Payer: Self-pay | Admitting: Internal Medicine

## 2019-02-18 ENCOUNTER — Other Ambulatory Visit: Payer: Self-pay

## 2019-02-18 ENCOUNTER — Ambulatory Visit: Payer: Medicaid Other | Attending: Family Medicine | Admitting: Pharmacist

## 2019-02-18 DIAGNOSIS — Z7901 Long term (current) use of anticoagulants: Secondary | ICD-10-CM | POA: Insufficient documentation

## 2019-02-18 DIAGNOSIS — Z86711 Personal history of pulmonary embolism: Secondary | ICD-10-CM

## 2019-02-18 DIAGNOSIS — K219 Gastro-esophageal reflux disease without esophagitis: Secondary | ICD-10-CM

## 2019-02-18 LAB — POCT INR: INR: 2.3 (ref 2.0–3.0)

## 2019-02-18 MED ORDER — DIVALPROEX SODIUM 500 MG PO DR TAB: 500.0000 mg | DELAYED_RELEASE_TABLET | Freq: Two times a day (BID) | ORAL | 0 refills | Status: DC

## 2019-02-18 MED ORDER — ALLOPURINOL 300 MG PO TABS: 300.0000 mg | ORAL_TABLET | Freq: Every day | ORAL | 0 refills | Status: DC

## 2019-02-18 MED FILL — AMLODIPINE BESYLATE 5 MG TA: 5 | 60 days supply | Qty: 60 | Fill #1

## 2019-02-18 MED FILL — LOSARTAN POTASSIUM 100 MG T: 100 | 60 days supply | Qty: 60 | Fill #1

## 2019-02-18 MED FILL — GABAPENTIN 400 MG CAPSULE: 400 | 90 days supply | Qty: 180 | Fill #6

## 2019-02-18 MED FILL — WARFARIN SODIUM 5 MG TABLET: 5 | 90 days supply | Qty: 90 | Fill #1

## 2019-02-18 MED FILL — TAMSULOSIN HCL 0.4 MG CAP: 0.4 | 30 days supply | Qty: 30 | Fill #3

## 2019-02-18 MED FILL — ALLOPURINOL 300 MG TAB: 300 | 90 days supply | Qty: 90 | Fill #0

## 2019-02-20 ENCOUNTER — Other Ambulatory Visit: Payer: Self-pay | Admitting: Pharmacist

## 2019-02-20 MED ORDER — NITROGLYCERIN 0.4 MG SL SUBL: 0.4000 mg | SUBLINGUAL_TABLET | SUBLINGUAL | 2 refills | Status: AC | PRN

## 2019-02-20 MED ORDER — DIVALPROEX SODIUM 500 MG PO DR TAB: 500.0000 mg | DELAYED_RELEASE_TABLET | Freq: Two times a day (BID) | ORAL | 0 refills | Status: DC

## 2019-02-20 MED ORDER — ROSUVASTATIN CALCIUM 40 MG PO TABS: 40.0000 mg | ORAL_TABLET | Freq: Every day | ORAL | 2 refills | Status: DC

## 2019-02-20 MED FILL — traZODone HCL 100 MG TABS: 100 | 30 days supply | Qty: 30 | Fill #3

## 2019-02-20 MED FILL — NITROGLYCERIN 0.4 MG TAB SL: 0.4 | 25 days supply | Qty: 25 | Fill #0

## 2019-02-20 MED FILL — ROSUVASTATIN CALCIUM 40 MG: 40 | 30 days supply | Qty: 30 | Fill #0

## 2019-02-20 MED FILL — DIVALPROEX SOD DR 500 MG TA: 500 | 30 days supply | Qty: 60 | Fill #0

## 2019-03-17 ENCOUNTER — Telehealth: Payer: Self-pay | Admitting: Neurology

## 2019-03-17 NOTE — Telephone Encounter (Signed)
Due to current COVID 19 pandemic, our office is severely reducing in office visits until further notice, in order to minimize the risk to our patients and healthcare providers.   Called patient and offered a virtual visit for his 5/28 appt. Patient declined as he does not have internet access and feels that a telephone visit would be best. I advised that he will receive 3 calls: one from RN to update chart, 1 from check in aprx. 30 minutes prior to appt, then finally doctor will call around appt time. Pt verbalized understanding.  Pt understands that although there may be some limitations with this type of visit, we will take all precautions to reduce any security or privacy concerns.  Pt understands that this will be treated like an in office visit and we will file with pt's insurance, and there may be a patient responsible charge related to this service.

## 2019-03-19 ENCOUNTER — Encounter: Payer: Self-pay | Admitting: Neurology

## 2019-03-19 ENCOUNTER — Telehealth: Payer: Self-pay | Admitting: Neurology

## 2019-03-19 ENCOUNTER — Other Ambulatory Visit: Payer: Self-pay

## 2019-03-19 ENCOUNTER — Ambulatory Visit (INDEPENDENT_AMBULATORY_CARE_PROVIDER_SITE_OTHER): Payer: Medicaid Other | Admitting: Neurology

## 2019-03-19 DIAGNOSIS — R569 Unspecified convulsions: Secondary | ICD-10-CM

## 2019-03-19 NOTE — Progress Notes (Signed)
     Virtual Visit via Telephone Note  I connected with Russell Carter on 03/19/19 at  1:30 PM EDT by telephone and verified that I am speaking with the correct person using two identifiers.  Location: Patient: The patient is at home. Provider: Physician in office.   I discussed the limitations, risks, security and privacy concerns of performing an evaluation and management service by telephone and the availability of in person appointments. I also discussed with the patient that there may be a patient responsible charge related to this service. The patient expressed understanding and agreed to proceed.   History of Present Illness: Russell Carter is a 63 year old right-handed black male with a history of seizures.  The patient is on Depakote currently taking 500 mg twice daily.  He does have a history of polysubstance abuse as well.  He recently was admitted to the hospital on 24 December 2018 with a syncopal event at home, patient claims that he was unconscious for about 15 minutes.  He was noted to have left-sided weakness and a left homonymous visual field deficit, he underwent a CT scan and CT angiogram and then was having TPA.  The patient eventually had MRI of the brain that did not confirm the presence of a stroke.  2D echocardiogram and EEG study were unremarkable.  The patient claims that he has some residual left-sided vision alteration and some left-sided weakness.  He has chronic left arm weakness are related to cervical radiculopathy issues.  The patient still has headaches once or twice a week, he will take extra strength Tylenol for this with good improvement.  The patient has not had any further seizure type events since last seen in the office about 6 months ago.  He is tolerating the Depakote well.   Observations/Objective: On the telephone evaluation, the patient is alert and cooperative, speech is well initiated, not aphasic or dysarthric.  Assessment and Plan: 1.  History of  seizures  2.  Recent hospitalization with syncope, stroke ruled out  3.  History of polysubstance abuse  The patient will continue his Depakote, he is on aspirin currently.  He will contact me if he has any further events, he will follow-up here in about 6 months.  A urine drug screen was never checked during the most recent hospitalization.  Follow Up Instructions: 32-month follow-up, may see nurse practitioner.   I discussed the assessment and treatment plan with the patient. The patient was provided an opportunity to ask questions and all were answered. The patient agreed with the plan and demonstrated an understanding of the instructions.   The patient was advised to call back or seek an in-person evaluation if the symptoms worsen or if the condition fails to improve as anticipated.  I provided 15 minutes of non-face-to-face time during this encounter.   York Spaniel, MD

## 2019-03-19 NOTE — Telephone Encounter (Signed)
LVM to schedule 6 month follow-up with Sarah NP per Dr. Willis. °

## 2019-03-26 ENCOUNTER — Other Ambulatory Visit: Payer: Self-pay

## 2019-03-26 ENCOUNTER — Ambulatory Visit: Payer: Medicaid Other | Attending: Internal Medicine | Admitting: Pharmacist

## 2019-03-26 ENCOUNTER — Other Ambulatory Visit: Payer: Self-pay | Admitting: Internal Medicine

## 2019-03-26 DIAGNOSIS — R358 Other polyuria: Secondary | ICD-10-CM

## 2019-03-26 DIAGNOSIS — Z86711 Personal history of pulmonary embolism: Secondary | ICD-10-CM | POA: Insufficient documentation

## 2019-03-26 DIAGNOSIS — R3589 Other polyuria: Secondary | ICD-10-CM

## 2019-03-26 DIAGNOSIS — K219 Gastro-esophageal reflux disease without esophagitis: Secondary | ICD-10-CM

## 2019-03-26 LAB — POCT INR: INR: 4.1 — AB (ref 2.0–3.0)

## 2019-03-27 ENCOUNTER — Other Ambulatory Visit: Payer: Self-pay

## 2019-03-27 NOTE — Patient Outreach (Signed)
First attempt to obtain mRs. No answer. Left message for return call.  

## 2019-03-31 ENCOUNTER — Other Ambulatory Visit: Payer: Self-pay | Admitting: Internal Medicine

## 2019-03-31 DIAGNOSIS — R358 Other polyuria: Secondary | ICD-10-CM

## 2019-03-31 DIAGNOSIS — R3589 Other polyuria: Secondary | ICD-10-CM

## 2019-03-31 DIAGNOSIS — K219 Gastro-esophageal reflux disease without esophagitis: Secondary | ICD-10-CM

## 2019-04-02 ENCOUNTER — Other Ambulatory Visit: Payer: Self-pay

## 2019-04-08 ENCOUNTER — Encounter (HOSPITAL_COMMUNITY): Payer: Self-pay | Admitting: Emergency Medicine

## 2019-04-08 ENCOUNTER — Emergency Department (HOSPITAL_COMMUNITY)
Admission: EM | Admit: 2019-04-08 | Discharge: 2019-04-09 | Disposition: A | Discharge status: Home / Self Care | Payer: Medicaid Other | Attending: Emergency Medicine | Admitting: Emergency Medicine

## 2019-04-08 DIAGNOSIS — R05 Cough: Secondary | ICD-10-CM | POA: Diagnosis not present

## 2019-04-08 DIAGNOSIS — Z7901 Long term (current) use of anticoagulants: Secondary | ICD-10-CM | POA: Diagnosis not present

## 2019-04-08 DIAGNOSIS — I252 Old myocardial infarction: Secondary | ICD-10-CM | POA: Insufficient documentation

## 2019-04-08 DIAGNOSIS — R569 Unspecified convulsions: Secondary | ICD-10-CM | POA: Diagnosis not present

## 2019-04-08 DIAGNOSIS — R531 Weakness: Secondary | ICD-10-CM | POA: Diagnosis not present

## 2019-04-08 DIAGNOSIS — Z79899 Other long term (current) drug therapy: Secondary | ICD-10-CM | POA: Diagnosis not present

## 2019-04-08 DIAGNOSIS — R072 Precordial pain: Secondary | ICD-10-CM | POA: Diagnosis not present

## 2019-04-08 DIAGNOSIS — Z8673 Personal history of transient ischemic attack (TIA), and cerebral infarction without residual deficits: Secondary | ICD-10-CM | POA: Insufficient documentation

## 2019-04-08 DIAGNOSIS — I2510 Atherosclerotic heart disease of native coronary artery without angina pectoris: Secondary | ICD-10-CM | POA: Insufficient documentation

## 2019-04-08 DIAGNOSIS — F1210 Cannabis abuse, uncomplicated: Secondary | ICD-10-CM | POA: Diagnosis not present

## 2019-04-08 DIAGNOSIS — F1410 Cocaine abuse, uncomplicated: Secondary | ICD-10-CM | POA: Insufficient documentation

## 2019-04-08 DIAGNOSIS — I129 Hypertensive chronic kidney disease with stage 1 through stage 4 chronic kidney disease, or unspecified chronic kidney disease: Secondary | ICD-10-CM | POA: Diagnosis not present

## 2019-04-08 DIAGNOSIS — N183 Chronic kidney disease, stage 3 (moderate): Secondary | ICD-10-CM | POA: Diagnosis not present

## 2019-04-08 DIAGNOSIS — Z7982 Long term (current) use of aspirin: Secondary | ICD-10-CM | POA: Insufficient documentation

## 2019-04-08 DIAGNOSIS — E1122 Type 2 diabetes mellitus with diabetic chronic kidney disease: Secondary | ICD-10-CM | POA: Insufficient documentation

## 2019-04-08 DIAGNOSIS — R079 Chest pain, unspecified: Secondary | ICD-10-CM | POA: Diagnosis present

## 2019-04-08 MED ORDER — SODIUM CHLORIDE 0.9% FLUSH: 3.0000 mL | Freq: Once | INTRAVENOUS | Status: DC

## 2019-04-08 NOTE — ED Triage Notes (Signed)
BIB EMS from home. Pt reports L sided CP with radiation into L arm onset 1hr PTA. [facility], 9/10. Per EMS pt also had a "shaking" episode en route. Pt given 1NTG, 324 ASA. Pain remains 9/10. VSS.

## 2019-04-09 ENCOUNTER — Other Ambulatory Visit: Payer: Self-pay

## 2019-04-09 ENCOUNTER — Emergency Department (HOSPITAL_COMMUNITY): Payer: Medicaid Other

## 2019-04-09 LAB — BASIC METABOLIC PANEL
Anion gap: 9 (ref 5–15)
BUN: 13 mg/dL (ref 8–23)
CO2: 21 mmol/L — ABNORMAL LOW (ref 22–32)
Calcium: 9.3 mg/dL (ref 8.9–10.3)
Chloride: 108 mmol/L (ref 98–111)
Creatinine, Ser: 1.33 mg/dL — ABNORMAL HIGH (ref 0.61–1.24)
GFR calc Af Amer: >60 mL/min (ref >60)
GFR calc non Af Amer: 56 mL/min — ABNORMAL LOW (ref >60)
Glucose, Bld: 94 mg/dL (ref 70–99)
Potassium: 3.9 mmol/L (ref 3.5–5.1)
Sodium: 138 mmol/L (ref 135–145)

## 2019-04-09 LAB — CBC
HCT: 40.4 % (ref 39.0–52.0)
Hemoglobin: 13.1 g/dL (ref 13.0–17.0)
MCH: 29 pg (ref 26.0–34.0)
MCHC: 32.4 g/dL (ref 30.0–36.0)
MCV: 89.6 fL (ref 80.0–100.0)
Platelets: 212 10*3/uL (ref 150–400)
RBC: 4.51 MIL/uL (ref 4.22–5.81)
RDW: 14.5 % (ref 11.5–15.5)
WBC: 5.2 10*3/uL (ref 4.0–10.5)
nRBC: 0 % (ref 0.0–0.2)

## 2019-04-09 LAB — TROPONIN I
Troponin I: <0.03 ng/mL (ref <0.03)
Troponin I: <0.03 ng/mL (ref <0.03)

## 2019-04-09 LAB — VALPROIC ACID LEVEL: Valproic Acid Lvl: <10 ug/mL — ABNORMAL LOW (ref 50.0–100.0)

## 2019-04-09 LAB — PROTIME-INR
INR: 1.3 — ABNORMAL HIGH (ref 0.8–1.2)
Prothrombin Time: 15.7 seconds — ABNORMAL HIGH (ref 11.4–15.2)

## 2019-04-09 MED ORDER — FENTANYL CITRATE (PF) 100 MCG/2ML IJ SOLN
100.0000 ug | Freq: Once | INTRAMUSCULAR | Status: AC
  Administered 2019-04-09: 100 ug via INTRAVENOUS
  Filled 2019-04-09: qty 2

## 2019-04-09 MED ORDER — FENTANYL CITRATE (PF) 100 MCG/2ML IJ SOLN
100.0000 ug | Freq: Once | INTRAMUSCULAR | Status: AC
  Administered 2019-04-09: 05:00:00 100 ug via INTRAVENOUS
  Filled 2019-04-09: qty 2

## 2019-04-09 NOTE — ED Provider Notes (Signed)
[facility] Provider Note   CSN: [id number] Arrival date & time: 04/08/19  2338     History   Chief Complaint Chief Complaint  Patient presents with  . Chest Pain    HPI Russell Carter is a 63 y.o. male.     The history is provided by the patient.  Chest Pain Pain location:  L chest Pain quality: aching   Pain radiates to:  L arm Pain severity:  Moderate Onset quality:  Gradual Timing:  Constant Progression:  Unchanged Chronicity:  New Relieved by:  Nothing Worsened by:  Nothing Associated symptoms: cough and weakness   Associated symptoms: no fever   Patient with a history of CAD, cervical stenosis, PE, seizures, stroke presents with chest pain and seizure.  Reports began having chest pain earlier in the night with radiation to left arm.  Then he reports he that shaking/seizure-like episode.  He reports loss of consciousness. He reports left neck, left arm/leg LE pain/weakness after seizure which is typical for him Patient denies any significant trauma from his seizure activity No tongue lacerations/incontinence after seizure Past Medical History:  Diagnosis Date  . Acute kidney injury (HCC) 04/27/2018  . AKI (acute kidney injury) (HCC)   . Anginal pain (HCC)   . Anxiety   . Arthritis   . Bipolar disorder (HCC)   . Blood in stool   . CAD (coronary artery disease)    a. 2011: cath showing mod non-obstructive disease b. 02/2016: cath with 95% stenosis in the Mid RCA. DES placed.  . Cervical stenosis of spine   . Chronic anticoagulation 04/27/2018  . Chronic pain disorder 06/05/2017  . CKD (chronic kidney disease), stage III (HCC) 05/06/2018  . Cocaine abuse (HCC)    history  . Complete heart block (HCC) 03/14/2016  . Depression   . Diabetes mellitus type 2 in nonobese (HCC)   . Diabetes mellitus without complication (HCC)   . Dyslipidemia 03/15/2016  . Essential hypertension 05/18/2009   Qualifier: Diagnosis of  By: Russell Carter,  Russell Carter    . Gastroesophageal reflux disease without esophagitis 07/31/2017  . Gout, unspecified   . Headache   . History of CVA (cerebrovascular accident)   . History of pulmonary embolus (PE) 02/14/2017  . HTN (hypertension)   . Hypercholesterolemia   . Hyperglycemia   . Leukopenia 05/06/2018  . Migraines   . Myocardial infarction [facility](HCC) 2008   drug cocaine and marijuana at time of mi none  since    . Normocytic anemia 05/06/2018  . Pneumonia   . Polysubstance abuse (HCC) 01/15/2013  . Rectal bleeding   . Renal insufficiency   . Seizure (HCC) 05/06/2018  . Seizures (HCC)   . Stroke [facility](HCC)     Patient Active Problem List   Diagnosis Date Noted  . Stroke (HCC) 12/24/2018  . CKD (chronic kidney disease), stage III (HCC) 05/06/2018  . Seizure (HCC) 05/06/2018  . Normocytic anemia 05/06/2018  . Leukopenia 05/06/2018  . AKI (acute kidney injury) (HCC)   . Acute kidney injury (HCC) 04/27/2018  . Abdominal pain 04/27/2018  . Chronic anticoagulation 04/27/2018  . Left knee pain 01/10/2018  . Numbness and tingling in both hands 07/31/2017  . Gastroesophageal reflux disease without esophagitis 07/31/2017  . Chronic pain disorder 06/05/2017  . History of pulmonary embolus (PE) 02/14/2017  . Diabetes mellitus type 2 in nonobese (HCC)   . Cocaine abuse (HCC)   . Depression   . History of CVA (cerebrovascular accident)   .  Cervical spondylosis with radiculopathy 01/24/2017  . Dyslipidemia 03/15/2016  . Complete heart block (HCC) 03/14/2016  . Cervical spinal stenosis 03/14/2016  . CAD-50% LAD 2011 12/14/2014  . Gout 11/16/2014  . Polysubstance abuse (HCC) 01/15/2013  . DIZZINESS 06/05/2010  . Essential hypertension 05/18/2009    Past Surgical History:  Procedure Laterality Date  . ANTERIOR CERVICAL DECOMP/DISCECTOMY FUSION N/A 01/24/2017   Procedure: Cervical Four-Five,Cervical Five Six,Cervical Six-Seven Anterior cervical discectomy with fusion and plate with Cervical  Three-Thoracic- One Laminectomy/Foraminotomy with Cervical Three-Thoracic- One Fixation and fusion;  Surgeon: Russell HaltBenjamin Jared Ditty, MD;  [facility];  Service: Neurosurgery;  Laterality: N/A;  Part-1 anterior approach  . CARDIAC CATHETERIZATION  2008; ~ 2011   Leonard-40%lad  . CARDIAC CATHETERIZATION N/A 03/16/2016   Procedure: Left Heart Cath and Coronary Angiography;  Surgeon: Marykay Lexavid W Harding, MD;  [facility];  Service: Cardiovascular;  Laterality: N/A;  . COLONOSCOPY WITH PROPOFOL N/A 04/17/2018   Procedure: COLONOSCOPY WITH PROPOFOL;  Surgeon: Russell Carter, Russell F, MD;  [facility];  Service: Endoscopy;  Laterality: N/A;  . CORONARY ANGIOPLASTY     STENTS  . EYE SURGERY Bilateral    "for double vision"  . INGUINAL HERNIA REPAIR  05/12/2012   Procedure: HERNIA REPAIR INGUINAL ADULT;  Surgeon: Russell Carter Russell Thompson, MD;  Location: [facility];  Service: General;  Laterality: Right;  . INGUINAL HERNIA REPAIR Left 02/02/2003   Dr. Violeta GelinasBurke Carter. repair with mesh. Same procedure done again in  05/22/2004   . INGUINAL HERNIA REPAIR Left ?date 2nd OR  . LEFT HEART CATH AND CORONARY ANGIOGRAPHY N/A 04/29/2018   Procedure: LEFT HEART CATH AND CORONARY ANGIOGRAPHY;  Surgeon: Russell Carter, Russell M, MD;  [facility];  Service: Cardiovascular;  Laterality: N/A;  . POSTERIOR CERVICAL FUSION/FORAMINOTOMY N/A 01/24/2017   Procedure: Cervical Three-Thoracic One-Laminectomy/Foraminotomy with Cervical Three-Thoracic One Fixation and fusion;  Surgeon: Russell HaltBenjamin Jared Ditty, MD;  [facility];  Service: Neurosurgery;  Laterality: N/A;  Part-2 Posterior approach  . RECONSTRUCT / STABILIZE DISTAL ULNA    . SHOULDER OPEN ROTATOR CUFF REPAIR Left 2010  . SHOULDER SURGERY Left    "injured on the job; had to cut small bone out"  . THUMB FUSION Right         Home Medications    Prior to Admission medications   Medication Sig Start Date End Date Taking? Authorizing  Provider  allopurinol (ZYLOPRIM) 300 MG tablet Take 1 tablet (300 mg total) by mouth daily. 02/18/19   [name], [name], MD  amLODipine (NORVASC) 5 MG tablet Take 1 tablet (5 mg total) by mouth daily. 12/18/18   [name], [name], MD  aspirin EC 81 MG tablet Take 1 tablet (81 mg total) by mouth daily. 12/26/18 12/26/19  Arline AspWilliams, Jessica N, NP  Calcium Carb-Cholecalciferol (CALCIUM-VITAMIN D) 600-400 MG-UNIT TABS Take 1 tablet by mouth daily. 05/07/18   Shon HaleEmokpae, Courage, MD  divalproex (DEPAKOTE) 500 MG DR tablet Take 1 tablet (500 mg total) by mouth 2 (two) times daily. 02/20/19   [name], [name], MD  DULoxetine (CYMBALTA) 30 MG capsule Take 1 capsule (30 mg total) by mouth 2 (two) times daily. 01/20/19   [name], [name], MD  folic acid (FOLVITE) 1 MG tablet Take 1 tablet (1 mg total) by mouth daily. 05/07/18   Shon HaleEmokpae, Courage, MD  gabapentin (NEURONTIN) 400 MG capsule Take 1 capsule (400 mg total) by mouth 2 (two) times daily. 05/07/18   Shon HaleEmokpae, Courage, MD  gabapentin (NEURONTIN) 600 MG tablet Take 600 mg by mouth 4 (four) times daily. 07/24/18   [provider]  losartan (COZAAR) 100 MG tablet Take 1 tablet (100 mg total) by mouth daily. 12/18/18   Marcine Matar, MD  methocarbamol (ROBAXIN) 750 MG tablet Take 1 tablet (750 mg total) by mouth every 8 (eight) hours as needed for muscle spasms. 05/07/18   Shon Hale, MD  metoCLOPramide (REGLAN) 10 MG tablet TAKE 1 TABLET BY MOUTH EVERY 6 HOURS AS NEEDED FOR NAUSEA OR HEADACHE. 01/05/19   Marcine Matar, MD  Multiple Vitamin (MULTIVITAMIN WITH MINERALS) TABS tablet Take 1 tablet by mouth daily. 05/07/18   Shon Hale, MD  nitroGLYCERIN (NITROSTAT) 0.4 MG SL tablet Place 1 tablet (0.4 mg total) under the tongue every 5 (five) minutes as needed for chest pain. 02/20/19   Marcine Matar, MD  oxyCODONE-acetaminophen (PERCOCET) 7.5-325 MG tablet Take 1 tablet by mouth 3 (three) times daily. 12/26/18 12/26/19  Arline Asp, NP   rosuvastatin (CRESTOR) 40 MG tablet Take 1 tablet (40 mg total) by mouth daily. 02/20/19   Marcine Matar, MD  traZODone (DESYREL) 100 MG tablet Take 1 tablet (100 mg total) by mouth at bedtime as needed for sleep. 05/07/18   Shon Hale, MD  warfarin (COUMADIN) 5 MG tablet Take 5 mg by mouth. As directed 01/01/19   [provider]    Family History Family History  Problem Relation Age of Onset  . Heart attack Mother        x7. Still alive  . Heart attack Father        deceased b/c of MI  . Heart attack Brother        older, deceased b/c of MI   . Cancer Brother        bone cancer in 1 brother, ? brain cancer in other brother  . Heart attack Brother   . Other Brother        MVA  . Other Brother   . Bone cancer Brother   . Cancer Brother   . SIDS Sister     Social History Social History   Tobacco Use  . Smoking status: Never Smoker  . Smokeless tobacco: Never Used  Substance Use Topics  . Alcohol use: Yes    Comment: 16 oz beer every 2-3 days  . Drug use: Yes    Types: "Crack" cocaine, Marijuana, Cocaine    Comment:  "quit 2008"     Allergies   Shellfish allergy   Review of Systems Review of Systems  Constitutional: Negative for fever.  Respiratory: Positive for cough.   Cardiovascular: Positive for chest pain.  Neurological: Positive for seizures and weakness.  All other systems reviewed and are negative.    Physical Exam Updated Vital Signs BP 120/60 (BP Location: Left Arm)   Pulse 77   Temp 98.5 Carter (36.9 C) (Oral)   Resp 18   SpO2 99%   Physical Exam CONSTITUTIONAL: Well developed/well nourished HEAD: Normocephalic/atraumatic EYES: EOMI/PERRL ENMT: Mucous membranes moist, no tongue laceration NECK: supple no meningeal signs SPINE/BACK:entire spine nontender, left cervical paraspinal tenderness CV: S1/S2 noted, no murmurs/rubs/gallops noted LUNGS: Lungs are clear to auscultation bilaterally, no apparent distress ABDOMEN: soft,  nontender, no rebound or guarding, bowel sounds noted throughout abdomen GU:no cva tenderness NEURO: Pt is awake/alert/appropriate, moves all extremitiesx4.  No facial droop.  No arm drift.  Mild weakness noted to left LE with hip flexion, limited due to pain,  pt reports this is typical after seizure EXTREMITIES: pulses normal/equal, full ROM, All extremities/joints palpated/ranged and nontender, no deformities SKIN: warm, color normal PSYCH: no abnormalities of mood noted, alert and oriented to situation   ED Treatments / Results  Labs (all labs ordered are listed, but only abnormal results are displayed) Labs Reviewed  BASIC METABOLIC PANEL - Abnormal; Notable for the following components:      Result Value   CO2 21 (*)    Creatinine, Ser 1.33 (*)    GFR calc non Af Amer 56 (*)    All other components within normal limits  PROTIME-INR - Abnormal; Notable for the following components:   Prothrombin Time 15.7 (*)    INR 1.3 (*)    All other components within normal limits  VALPROIC ACID LEVEL - Abnormal; Notable for the following components:   Valproic Acid Lvl <10 (*)    All other components within normal limits  CBC  TROPONIN I  TROPONIN I    EKG     EKG Interpretation  Date/Time:  Thursday April 09 2019 00:54:11 EDT Ventricular Rate:  84 PR Interval:    QRS Duration: 88 QT Interval:  368 QTC Calculation: 435 R Axis:   46 Text Interpretation:  Sinus rhythm Abnormal R-wave progression, early transition No significant change since last tracing Confirmed by Zadie RhineWickline, Zadin Lange ([id number]) on 04/09/2019 1:03:56 AM      ED ECG REPORT   Date: 04/08/2019 2326  Rate: 59  Rhythm: sinus bradycardia  QRS Axis: normal  Intervals: PR prolonged  ST/T Wave abnormalities: normal  Conduction Disutrbances:first-degree A-V block    I have personally reviewed the EKG tracing and agree with the computerized printout as noted.  Radiology Dg Chest Portable 1 View  Result Date:  04/09/2019 CLINICAL DATA:  Left side chest pain radiating to left arm. EXAM: PORTABLE CHEST 1 VIEW COMPARISON:  05/05/2018 FINDINGS: Mild elevation of the left hemidiaphragm. Heart is normal size. Lungs clear. No effusions. No acute bony abnormality. IMPRESSION: No active disease. Electronically Signed   By: Charlett NoseKevin  Dover Carter.D.   On: 04/09/2019 00:30    Procedures Procedures   Medications Ordered in ED Medications  sodium chloride flush (NS) 0.9 % injection 3 mL (has no administration in time range)  fentaNYL (SUBLIMAZE) injection 100 mcg (100 mcg Intravenous Given 04/09/19 0130)  fentaNYL (SUBLIMAZE) injection 100 mcg (100 mcg Intravenous Given 04/09/19 0435)     Initial Impression / Assessment and Plan / ED Course  I have reviewed the triage vital signs and the nursing notes.  Pertinent labs & imaging results that were available during my care of the patient were reviewed by me and considered in my medical decision making (see chart for details).        1:19 AM EKG did not cross into epic appears to be first-degree AV block. Repeat EKG appears to be similar to prior EKGs. Per previous records, patient's had similar episodes before with reported seizures and left-sided weakness and pain. 1:52 AM Cardiac cath 2019 revealed nonobstructive CAD, stent widely patent.  Patient now reporting mostly pain in arm and leg after his seizure.  He is feeling improved.  Plan to repeat troponin, and if patient improves and troponin negative he will be discharged home 5:18 AM Patient improved.  He has not mentioned chest pain in hours. Reports continued pain in his legs similar to prior.  I reviewed previous neurology notes and this presentation appears similar to prior. He reports he gets pain  or weakness in his legs after a seizure.  No further seizure activity here. Patient was able to ambulate, reported dizziness but steady gait.  He feels comfortable for discharge.  His Coumadin level is low, he  reports med compliance.  He will take an extra 5mg  dose today, and to go back to normal schedule tomorrow.  He will take Coumadin 10 mg today.  He will follow with his neurologist about his Depakote dosing as it is low as well. Pt does not drive currently He feels safe for d/c home  Final Clinical Impressions(s) / ED Diagnoses   Final diagnoses:  Precordial pain  Seizure [facility])    ED Discharge Orders    None       Ripley Fraise, MD 04/09/19 [phone]

## 2019-04-09 NOTE — ED Notes (Signed)
E-signature not available, verbalized understanding of DC instructions and follow up care. Taxi called for pt.

## 2019-04-09 NOTE — Patient Outreach (Signed)
Telephone outreach to patient to obtain mRs was successfully completed. mRs= 4. Patient called back to complete questionnaire.

## 2019-04-09 NOTE — Discharge Instructions (Addendum)
You can take 2 tablets of coumadin today, and then go back to normal dosing on Friday 04/10/2019   Please be aware you may have another seizure  Do not drive until seen by your physician for your condition  Do not climb ladders/roofs/trees as a seizure can occur at that height and cause serious harm  Do not bathe/swim alone as a seizure can occur and cause serious harm  Please followup with your physician or neurologist for further testing and possible treatment

## 2019-04-09 NOTE — ED Notes (Signed)
Pt ambulated in hallway. Reports dizziness but had a steady gait.

## 2019-04-09 NOTE — Patient Outreach (Signed)
3 outreach attempts were completed to obtain mRs. mRs could not be obtained because patient never returned my calls. mRs=7 

## 2019-04-13 ENCOUNTER — Emergency Department (HOSPITAL_COMMUNITY): Payer: Medicaid Other

## 2019-04-13 ENCOUNTER — Emergency Department (HOSPITAL_COMMUNITY)
Admission: EM | Admit: 2019-04-13 | Discharge: 2019-04-13 | Disposition: A | Discharge status: Home / Self Care | Payer: Medicaid Other | Attending: Emergency Medicine | Admitting: Emergency Medicine

## 2019-04-13 ENCOUNTER — Encounter (HOSPITAL_COMMUNITY): Payer: Self-pay

## 2019-04-13 ENCOUNTER — Other Ambulatory Visit: Payer: Self-pay

## 2019-04-13 DIAGNOSIS — Z7982 Long term (current) use of aspirin: Secondary | ICD-10-CM | POA: Diagnosis not present

## 2019-04-13 DIAGNOSIS — E860 Dehydration: Secondary | ICD-10-CM | POA: Insufficient documentation

## 2019-04-13 DIAGNOSIS — R55 Syncope and collapse: Secondary | ICD-10-CM | POA: Diagnosis present

## 2019-04-13 DIAGNOSIS — I129 Hypertensive chronic kidney disease with stage 1 through stage 4 chronic kidney disease, or unspecified chronic kidney disease: Secondary | ICD-10-CM | POA: Insufficient documentation

## 2019-04-13 DIAGNOSIS — N183 Chronic kidney disease, stage 3 (moderate): Secondary | ICD-10-CM | POA: Diagnosis not present

## 2019-04-13 DIAGNOSIS — E1122 Type 2 diabetes mellitus with diabetic chronic kidney disease: Secondary | ICD-10-CM | POA: Insufficient documentation

## 2019-04-13 DIAGNOSIS — Z79899 Other long term (current) drug therapy: Secondary | ICD-10-CM | POA: Diagnosis not present

## 2019-04-13 DIAGNOSIS — Z7901 Long term (current) use of anticoagulants: Secondary | ICD-10-CM | POA: Diagnosis not present

## 2019-04-13 DIAGNOSIS — T671XXA Heat syncope, initial encounter: Secondary | ICD-10-CM | POA: Insufficient documentation

## 2019-04-13 LAB — VALPROIC ACID LEVEL: Valproic Acid Lvl: 78 ug/mL (ref 50.0–100.0)

## 2019-04-13 LAB — CBC WITH DIFFERENTIAL/PLATELET
Abs Immature Granulocytes: 0.02 10*3/uL (ref 0.00–0.07)
Basophils Absolute: 0 10*3/uL (ref 0.0–0.1)
Basophils Relative: 0 %
Eosinophils Absolute: 0 10*3/uL (ref 0.0–0.5)
Eosinophils Relative: 1 %
HCT: 42.9 % (ref 39.0–52.0)
Hemoglobin: 13.9 g/dL (ref 13.0–17.0)
Immature Granulocytes: 0 %
Lymphocytes Relative: 34 %
Lymphs Abs: 1.7 10*3/uL (ref 0.7–4.0)
MCH: 29.6 pg (ref 26.0–34.0)
MCHC: 32.4 g/dL (ref 30.0–36.0)
MCV: 91.5 fL (ref 80.0–100.0)
Monocytes Absolute: 0.5 10*3/uL (ref 0.1–1.0)
Monocytes Relative: 11 %
Neutro Abs: 2.7 10*3/uL (ref 1.7–7.7)
Neutrophils Relative %: 54 %
Platelets: 192 10*3/uL (ref 150–400)
RBC: 4.69 MIL/uL (ref 4.22–5.81)
RDW: 14.8 % (ref 11.5–15.5)
WBC: 5 10*3/uL (ref 4.0–10.5)
nRBC: 0 % (ref 0.0–0.2)

## 2019-04-13 LAB — BASIC METABOLIC PANEL
Anion gap: 9 (ref 5–15)
BUN: 12 mg/dL (ref 8–23)
CO2: 21 mmol/L — ABNORMAL LOW (ref 22–32)
Calcium: 8.1 mg/dL — ABNORMAL LOW (ref 8.9–10.3)
Chloride: 108 mmol/L (ref 98–111)
Creatinine, Ser: 1.41 mg/dL — ABNORMAL HIGH (ref 0.61–1.24)
GFR calc Af Amer: >60 mL/min (ref >60)
GFR calc non Af Amer: 52 mL/min — ABNORMAL LOW (ref >60)
Glucose, Bld: 129 mg/dL — ABNORMAL HIGH (ref 70–99)
Potassium: 3.7 mmol/L (ref 3.5–5.1)
Sodium: 138 mmol/L (ref 135–145)

## 2019-04-13 LAB — BRAIN NATRIURETIC PEPTIDE: B Natriuretic Peptide: 9.6 pg/mL (ref 0.0–100.0)

## 2019-04-13 LAB — CBG MONITORING, ED: Glucose-Capillary: 129 mg/dL — ABNORMAL HIGH (ref 70–99)

## 2019-04-13 LAB — MAGNESIUM: Magnesium: 1.9 mg/dL (ref 1.7–2.4)

## 2019-04-13 LAB — PROTIME-INR
INR: 2.1 — ABNORMAL HIGH (ref 0.8–1.2)
Prothrombin Time: 23.1 seconds — ABNORMAL HIGH (ref 11.4–15.2)

## 2019-04-13 LAB — TROPONIN I: Troponin I: <0.03 ng/mL (ref <0.03)

## 2019-04-13 MED ORDER — OXYCODONE-ACETAMINOPHEN 5-325 MG PO TABS
1.5000 | ORAL_TABLET | Freq: Once | ORAL | Status: AC
  Administered 2019-04-13: 1.5 via ORAL
  Filled 2019-04-13: qty 2

## 2019-04-13 MED ORDER — LACTATED RINGERS IV BOLUS
750.0000 mL | Freq: Once | INTRAVENOUS | Status: AC
  Administered 2019-04-13: 750 mL via INTRAVENOUS

## 2019-04-13 MED ORDER — OXYCODONE-ACETAMINOPHEN 5-325 MG PO TABS: 1.5000 | ORAL_TABLET | Freq: Once | ORAL | Status: DC

## 2019-04-13 NOTE — Discharge Instructions (Addendum)
Advance activities of daily living as tolerated. Stand up and ambulate with assistance until comfortable doing independently. ° °Advance diet as tolerated based on nausea, vomiting. Start with clear liquids and soft mechanical and advance. ° °Please return to ED for chest pain, shortness of breath, uncontrolled nausea, vomiting, any new concerning symptoms. ° °If prescribed any medications please take appropriate dose as prescribed and appropriate frequency has prescribed. ° °Please follow up with your PCP in 2-4 weeks or sooner if needed. ° °

## 2019-04-13 NOTE — ED Triage Notes (Signed)
Pt BIB [facility] for eval of syncopal episode w/ unresponsiveness. EMS reports pt was unresponsive on their arrival, and came around as they loaded him in the truck. Initial systolic in the 38F, rec'd 750cc NS, lethargic but arousable on arrival. EMS reports hx of ETOH use w/ muscle relaxer use as well. Unknown ETOh intake today. Pt was seated outside in sun for about 1hour PTA. Pt also reports percocet use today as well. No meds admin by EMS

## 2019-04-13 NOTE — ED Provider Notes (Signed)
[name] EMERGENCY DEPARTMENT Provider Note   CSN: [id number] Arrival date & time: 04/13/19  1535    History   Chief Complaint Chief Complaint  Patient presents with   Loss of Consciousness    HPI Russell Carter is a 63 y.o. male.     The history is provided by the patient, medical records and the EMS personnel.  Loss of Consciousness Episode history:  Single Most recent episode:  Today Duration: unable to specify  Timing:  Unable to specify Progression:  Resolved Chronicity:  New Context: dehydration, normal activity and sitting down   Context: not exertion, not inactivity and not standing up   Context comment:  Sitting outside in the sun, waiting on transport to acute pain clinic appointment today at 4:45 pm  Witnessed: no   Relieved by:  Lying down and drinking Worsened by:  Nothing Ineffective treatments:  Sitting up, certain positions and activity Associated symptoms: diaphoresis and weakness   Associated symptoms: no anxiety, no chest pain, no difficulty breathing, no fever, no focal weakness, no palpitations, no shortness of breath, no visual change and no vomiting   Associated symptoms comment:  Left neck, shoulder pain Risk factors: coronary artery disease and seizures     Past Medical History:  Diagnosis Date   Acute kidney injury (HCC) 04/27/2018   AKI (acute kidney injury) (HCC)    Anginal pain (HCC)    Anxiety    Arthritis    Bipolar disorder (HCC)    Blood in stool    CAD (coronary artery disease)    a. 2011: cath showing mod non-obstructive disease b. 02/2016: cath with 95% stenosis in the Mid RCA. DES placed.   Cervical stenosis of spine    Chronic anticoagulation 04/27/2018   Chronic pain disorder 06/05/2017   CKD (chronic kidney disease), stage III (HCC) 05/06/2018   Cocaine abuse (HCC)    history   Complete heart block (HCC) 03/14/2016   Depression    Diabetes mellitus type 2 in nonobese [facility])    Diabetes  mellitus without complication (HCC)    Dyslipidemia 03/15/2016   Essential hypertension 05/18/2009   Qualifier: Diagnosis of  By: Kem Parkinson     Gastroesophageal reflux disease without esophagitis 07/31/2017   Gout, unspecified    Headache    History of CVA (cerebrovascular accident)    History of pulmonary embolus (PE) 02/14/2017   HTN (hypertension)    Hypercholesterolemia    Hyperglycemia    Leukopenia 05/06/2018   Migraines    Myocardial infarction (HCC) 2008   drug cocaine and marijuana at time of mi none  since     Normocytic anemia 05/06/2018   Pneumonia    Polysubstance abuse (HCC) 01/15/2013   Rectal bleeding    Renal insufficiency    Seizure (HCC) 05/06/2018   Seizures (HCC)    Stroke [facility])     Patient Active Problem List   Diagnosis Date Noted   Stroke (HCC) 12/24/2018   CKD (chronic kidney disease), stage III (HCC) 05/06/2018   Seizure (HCC) 05/06/2018   Normocytic anemia 05/06/2018   Leukopenia 05/06/2018   AKI (acute kidney injury) (HCC)    Acute kidney injury (HCC) 04/27/2018   Abdominal pain 04/27/2018   Chronic anticoagulation 04/27/2018   Left knee pain 01/10/2018   Numbness and tingling in both hands 07/31/2017   Gastroesophageal reflux disease without esophagitis 07/31/2017   Chronic pain disorder 06/05/2017   History of pulmonary embolus (PE) 02/14/2017   Diabetes mellitus type  2 in nonobese [facility])    Cocaine abuse (HCC)    Depression    History of CVA (cerebrovascular accident)    Cervical spondylosis with radiculopathy 01/24/2017   Dyslipidemia 03/15/2016   Complete heart block (HCC) 03/14/2016   Cervical spinal stenosis 03/14/2016   CAD-50% LAD 2011 12/14/2014   Gout 11/16/2014   Polysubstance abuse (HCC) 01/15/2013   DIZZINESS 06/05/2010   Essential hypertension 05/18/2009    Past Surgical History:  Procedure Laterality Date   ANTERIOR CERVICAL DECOMP/DISCECTOMY FUSION N/A 01/24/2017    Procedure: Cervical Four-Five,Cervical Five Six,Cervical Six-Seven Anterior cervical discectomy with fusion and plate with Cervical Three-Thoracic- One Laminectomy/Foraminotomy with Cervical Three-Thoracic- One Fixation and fusion;  Surgeon: Loura Halt Ditty, MD;  Location: [facility] OR;  Service: Neurosurgery;  Laterality: N/A;  Part-1 anterior approach   CARDIAC CATHETERIZATION  2008; ~ 2011   [place]-40%lad   CARDIAC CATHETERIZATION N/A 03/16/2016   Procedure: Left Heart Cath and Coronary Angiography;  Surgeon: Marykay Lex, MD;  Location: [facility] INVASIVE CV LAB;  Service: Cardiovascular;  Laterality: N/A;   COLONOSCOPY WITH PROPOFOL N/A 04/17/2018   Procedure: COLONOSCOPY WITH PROPOFOL;  Surgeon: Graylin Shiver, MD;  Location: [facility] ENDOSCOPY;  Service: Endoscopy;  Laterality: N/A;   CORONARY ANGIOPLASTY     STENTS   EYE SURGERY Bilateral    "for double vision"   INGUINAL HERNIA REPAIR  05/12/2012   Procedure: HERNIA REPAIR INGUINAL ADULT;  Surgeon: Liz Malady, MD;  Location: [facility];  Service: General;  Laterality: Right;   INGUINAL HERNIA REPAIR Left 02/02/2003   Dr. Violeta Gelinas. repair with mesh. Same procedure done again in  05/22/2004    INGUINAL HERNIA REPAIR Left ?date 2nd OR   LEFT HEART CATH AND CORONARY ANGIOGRAPHY N/A 04/29/2018   Procedure: LEFT HEART CATH AND CORONARY ANGIOGRAPHY;  Surgeon: Swazil[place], Peter M, MD;  Location[facility] INVASIVE CV LAB;  Service: Cardiovascular;  Laterality: N/A;   POSTERIOR CERVICAL FUSION/FORAMINOTOMY N/A 01/24/2017   Procedure: Cervical Three-Thoracic One-Laminectomy/Foraminotomy with Cervical Three-Thoracic One Fixation and fusion;  Surgeon: Loura Halt Ditty, MD;  Location: [facility] OR;  Service: Neurosurgery;  Laterality: N/A;  Part-2 Posterior approach   RECONSTRUCT / STABILIZE DISTAL ULNA     SHOULDER OPEN ROTATOR CUFF REPAIR Left 2010   SHOULDER SURGERY Left    "injured on the job; had to cut small bone out"   THUMB FUSION  Right         Home Medications    Prior to Admission medications   Medication Sig Start Date End Date Taking? Authorizing Provider  allopurinol (ZYLOPRIM) 300 MG tablet Take 1 tablet (300 mg total) by mouth daily. 02/18/19   Marcine Matar, MD  amLODipine (NORVASC) 5 MG tablet Take 1 tablet (5 mg total) by mouth daily. 12/18/18   Marcine Matar, MD  aspirin EC 81 MG tablet Take 1 tablet (81 mg total) by mouth daily. 12/26/18 12/26/19  Arline Asp, NP  Calcium Carb-Cholecalciferol (CALCIUM-VITAMIN D) 600-400 MG-UNIT TABS Take 1 tablet by mouth daily. 05/07/18   Shon Hale, MD  divalproex (DEPAKOTE) 500 MG DR tablet Take 1 tablet (500 mg total) by mouth 2 (two) times daily. 02/20/19   Marcine Matar, MD  DULoxetine (CYMBALTA) 30 MG capsule Take 1 capsule (30 mg total) by mouth 2 (two) times daily. 01/20/19   Marcine Matar, MD  folic acid (FOLVITE) 1 MG tablet Take 1 tablet (1 mg total) by mouth daily. 05/07/18   Emokpae, Courage,  MD  gabapentin (NEURONTIN) 400 MG capsule Take 1 capsule (400 mg total) by mouth 2 (two) times daily. 05/07/18   Shon HaleEmokpae, Courage, MD  gabapentin (NEURONTIN) 600 MG tablet Take 600 mg by mouth 4 (four) times daily. 07/24/18   [provider]  losartan (COZAAR) 100 MG tablet Take 1 tablet (100 mg total) by mouth daily. 12/18/18   [name], [name], MD  methocarbamol (ROBAXIN) 750 MG tablet Take 1 tablet (750 mg total) by mouth every 8 (eight) hours as needed for muscle spasms. 05/07/18   Shon HaleEmokpae, Courage, MD  metoCLOPramide (REGLAN) 10 MG tablet TAKE 1 TABLET BY MOUTH EVERY 6 HOURS AS NEEDED FOR NAUSEA OR HEADACHE. 01/05/19   [name], [name], MD  Multiple Vitamin (MULTIVITAMIN WITH MINERALS) TABS tablet Take 1 tablet by mouth daily. 05/07/18   Shon HaleEmokpae, Courage, MD  nitroGLYCERIN (NITROSTAT) 0.4 MG SL tablet Place 1 tablet (0.4 mg total) under the tongue every 5 (five) minutes as needed for chest pain. 02/20/19   [name], [name], MD    oxyCODONE-acetaminophen (PERCOCET) 7.5-325 MG tablet Take 1 tablet by mouth 3 (three) times daily. 12/26/18 12/26/19  Arline AspWilliams, Jessica N, NP  rosuvastatin (CRESTOR) 40 MG tablet Take 1 tablet (40 mg total) by mouth daily. 02/20/19   [name], [name], MD  traZODone (DESYREL) 100 MG tablet Take 1 tablet (100 mg total) by mouth at bedtime as needed for sleep. 05/07/18   Shon HaleEmokpae, Courage, MD  warfarin (COUMADIN) 5 MG tablet Take 5 mg by mouth. As directed 01/01/19   [provider]    Family History Family History  Problem Relation Age of Onset   Heart attack Mother        x7. Still alive   Heart attack Father        deceased b/c of MI   Heart attack Brother        older, deceased b/c of MI    Cancer Brother        bone cancer in 1 brother, ? brain cancer in other brother   Heart attack Brother    Other Brother        MVA   Other Brother    Bone cancer Brother    Cancer Brother    SIDS Sister     Social History Social History   Tobacco Use   Smoking status: Never Smoker   Smokeless tobacco: Never Used  Substance Use Topics   Alcohol use: Yes    Comment: 16 oz beer every 2-3 days   Drug use: Yes    Types: "Crack" cocaine, Marijuana, Cocaine    Comment:  "quit 2008"     Allergies   Shellfish allergy   Review of Systems Review of Systems  Constitutional: Positive for diaphoresis. Negative for fever.  Respiratory: Negative for shortness of breath.   Cardiovascular: Positive for syncope. Negative for chest pain and palpitations.  Gastrointestinal: Negative for vomiting.  Neurological: Positive for weakness. Negative for focal weakness.  All other systems reviewed and are negative.    Physical Exam Updated Vital Signs BP 114/75    Pulse 73    Temp 97.8 F (36.6 C) (Oral)    Resp 13    Ht 5\' 11"  (1.803 m)    Wt 95.7 kg    SpO2 96%    BMI 29.43 kg/m   Physical Exam Vitals signs and nursing note reviewed.  Constitutional:      Appearance: He is  well-developed.  HENT:  Head: Normocephalic and atraumatic.     Mouth/Throat:     Mouth: Mucous membranes are dry.     Comments: Uvula midline No obvious asymmetric palatine elevation No obvious swellings posterior oropharynx No obvious swellings appreciated on neck exam Patient able to move neck in all directions without difficulty, tolerating secretions appropriately  Eyes:     Extraocular Movements: Extraocular movements intact.     Conjunctiva/sclera: Conjunctivae normal.     Pupils: Pupils are equal, round, and reactive to light.  Neck:     Musculoskeletal: Normal range of motion and neck supple. Muscular tenderness present. No neck rigidity.     Comments: Tenderness with palpation left neck paraspinal  Cardiovascular:     Rate and Rhythm: Normal rate.  Pulmonary:     Effort: Pulmonary effort is normal. No respiratory distress.     Breath sounds: No stridor. No wheezing or rhonchi.  Abdominal:     Palpations: Abdomen is soft.     Tenderness: There is no abdominal tenderness.  Musculoskeletal:     Right lower leg: No edema.     Left lower leg: No edema.  Skin:    General: Skin is warm and dry.     Capillary Refill: Capillary refill takes 2 to 3 seconds.     Findings: No rash.  Neurological:     General: No focal deficit present.     Mental Status: He is alert and oriented to person, place, and time.     Comments: MAEW, patient states he feels like his strength is at baseline No sensory deficits bilateral upper extremities, bilateral lower extremities 2+ pulses throughout Normal finger-to-nose testing bilateral upper extremities  Grossly normal visual acuity, No obvious visual field cuts bilaterally. grossly normal extraocular movements on H testing Cranial nerves II through XII grossly intact CN 2 (Optic): Visual fields intact to confrontation CN 3,4,6 (EOM): Pupils equal and reactive to light. Full extraocular eye movement without nystagmus.  CN 5 (Trigeminal):  Facial sensation is normal, no weakness of masticatory muscles. CN 7 (Facial): No obvious facial droop CN 8 (Auditory): Auditory acuity grossly normal. CN 9,10 (Glossophar): The uvula is midline, the palate elevates symmetrically. CN 11 (spinal access): Head midline CN 12 (Hypoglossal): The tongue is midline.       ED Treatments / Results  Labs (all labs ordered are listed, but only abnormal results are displayed) Labs Reviewed  BASIC METABOLIC PANEL - Abnormal; Notable for the following components:      Result Value   CO2 21 (*)    Glucose, Bld 129 (*)    Creatinine, Ser 1.41 (*)    Calcium 8.1 (*)    GFR calc non Af Amer 52 (*)    All other components within normal limits  PROTIME-INR - Abnormal; Notable for the following components:   Prothrombin Time 23.1 (*)    INR 2.1 (*)    All other components within normal limits  CBG MONITORING, ED - Abnormal; Notable for the following components:   Glucose-Capillary 129 (*)    All other components within normal limits  CBC WITH DIFFERENTIAL/PLATELET  MAGNESIUM  TROPONIN I  VALPROIC ACID LEVEL  BRAIN NATRIURETIC PEPTIDE  RAPID URINE DRUG SCREEN, HOSP PERFORMED    EKG EKG Interpretation  Date/Time:  Monday April 13 2019 15:43:27 EDT Ventricular Rate:  74 PR Interval:    QRS Duration: 95 QT Interval:  470 QTC Calculation: 522 R Axis:   42 Text Interpretation:  Sinus rhythm Nonspecific T abnrm, anterolateral  leads Prolonged QT interval New since previous tracing Confirmed by Marily MemosMesner, Jason [phone]) on 04/13/2019 5:21:17 PM   Radiology Ct Head Wo Contrast  Result Date: 04/13/2019 CLINICAL DATA:  Syncope EXAM: CT HEAD WITHOUT CONTRAST CT CERVICAL SPINE WITHOUT CONTRAST TECHNIQUE: Multidetector CT imaging of the head and cervical spine was performed following the standard protocol without intravenous contrast. Multiplanar CT image reconstructions of the cervical spine were also generated. COMPARISON:  12/25/2018 FINDINGS: CT HEAD  FINDINGS Brain: No evidence of acute infarction, hemorrhage, hydrocephalus, extra-axial collection or mass lesion/mass effect. Periventricular white matter hypodensity. Vascular: No hyperdense vessel or unexpected calcification. Skull: Normal. Negative for fracture or focal lesion. Sinuses/Orbits: No acute finding. Other: None. CT CERVICAL SPINE FINDINGS Alignment: Normal. Skull base and vertebrae: No acute fracture. No primary bone lesion or focal pathologic process. Soft tissues and spinal canal: No prevertebral fluid or swelling. No visible canal hematoma. Disc levels: Anterior cervical discectomy and fusion from C4 through C7 with posterior laminectomy and fusion from C3 through T1. Upper chest: Negative. Other: None. IMPRESSION: 1. No acute intracranial pathology. Small-vessel white matter disease. 2. No fracture or static subluxation of the cervical spine. Anterior cervical discectomy and fusion from C4 through C7 with posterior laminectomy and fusion from C3 through T1. Electronically Signed   By: Lauralyn PrimesAlex  Bibbey M.D.   On: 04/13/2019 17:00   Ct Cervical Spine Wo Contrast  Result Date: 04/13/2019 CLINICAL DATA:  Syncope EXAM: CT HEAD WITHOUT CONTRAST CT CERVICAL SPINE WITHOUT CONTRAST TECHNIQUE: Multidetector CT imaging of the head and cervical spine was performed following the standard protocol without intravenous contrast. Multiplanar CT image reconstructions of the cervical spine were also generated. COMPARISON:  12/25/2018 FINDINGS: CT HEAD FINDINGS Brain: No evidence of acute infarction, hemorrhage, hydrocephalus, extra-axial collection or mass lesion/mass effect. Periventricular white matter hypodensity. Vascular: No hyperdense vessel or unexpected calcification. Skull: Normal. Negative for fracture or focal lesion. Sinuses/Orbits: No acute finding. Other: None. CT CERVICAL SPINE FINDINGS Alignment: Normal. Skull base and vertebrae: No acute fracture. No primary bone lesion or focal pathologic process.  Soft tissues and spinal canal: No prevertebral fluid or swelling. No visible canal hematoma. Disc levels: Anterior cervical discectomy and fusion from C4 through C7 with posterior laminectomy and fusion from C3 through T1. Upper chest: Negative. Other: None. IMPRESSION: 1. No acute intracranial pathology. Small-vessel white matter disease. 2. No fracture or static subluxation of the cervical spine. Anterior cervical discectomy and fusion from C4 through C7 with posterior laminectomy and fusion from C3 through T1. Electronically Signed   By: Lauralyn PrimesAlex  Bibbey M.D.   On: 04/13/2019 17:00    Procedures Procedures (including critical care time)  Medications Ordered in ED Medications  oxyCODONE-acetaminophen (PERCOCET/ROXICET) 5-325 MG per tablet 1.5 tablet (1.5 tablets Oral Given 04/13/19 1618)  lactated ringers bolus 750 mL (750 mLs Intravenous New Bag/Given 04/13/19 1619)     Initial Impression / Assessment and Plan / ED Course  I have reviewed the triage vital signs and the nursing notes.  Pertinent labs & imaging results that were available during my care of the patient were reviewed by me and considered in my medical decision making (see chart for details).        Medical Decision Making: Casimiro NeedleMichael Vanzandt is a 63 y.o. male who presented to the ED today s/p episode of syncope.  Past medical history significant for hypertension, polysubstance abuse, CAD, last cath in 2019 did not show any obvious obstructive disease, recurrent neck pain, diabetes, cocaine abuse, cervical spondylolysis, seizures  Reviewed and confirmed nursing documentation for past medical history, family history, social history.  On my initial exam, the pt was calm, cooperative, conversant, follows commands appropriately, GCS 15, not tachycardic, not hypotensive, afebrile, no increased work of breathing or respiratory distress, no signs of impending respiratory failure.   Clinical picture could be secondary to vasovagal etiology  versus dehydration, patient has no concerning signs or symptoms of syncope including exertional chest pain or exertional syncope, no family history of sudden cardiac death or unexplained death, patient has had no chest pain, no tearing chest pain, no back pain, EKG does show prolonged QT when compared with prior, QT 470 today otherwise not new concerning findings, specifically no signs of Brugada, HOCM, preexcitation.  Doubt aortic dissection.  Doubt VTE, patient is on systemic anticoagulation, will check INR to see if therapeutic,, no asymmetric bilateral upper extremity or bilateral lower extremity swelling, no postictal period, no loss of consciousness, no incontinence, no witnessed tonic-clonic movements, seizure history, depakote level therapuetic, doubt seizure.  Patient denies any headache prior to work headache now in the ED.  No new focal neuro deficits on exam, doubt SAH or ICH.  Patient denies any recent blood loss, no hematemesis, hemoptysis, hematochezia, no visual symptoms, no dysarthria, no difficulty with ambulation, no hearing loss or tinnitus, doubt posterior circulation stroke, patient denies any alcohol or illicit drug use.    EKG (my interpretation):      NS rhythm with a rate of  74.      QRS 95. QTc 522.        Prolonged QT when compared with previous from 04/09/2019      No acute ischemic ST-T segment changes.      No acute changes suggestive of hyperkalemia.      No WPW, or Brugada's Syndrome.  Hemoglobin 13.9, sodium 138, CO2 21, creatinine 1.4, appears at baseline, BUN 12, mag 1.9, INR 2.1, troponin negative, BNP 9.6 CT head and C-spine show no acute intracranial pathology or acute fracture  All radiology and laboratory studies reviewed independently and with my attending physician, agree with reading provided by radiologist unless otherwise noted.  Upon reassessing patient, patient was calm, no new complaints, pain improved but not resolved after medication Patient educated  about following up for EKG changes as an outpatient for continued evaluation and care Based on the above findings, I believe patient is hemodynamically stable for discharge  Patient educated about specific return precautions for given chief complaint and symptoms.  Patient educated about follow-up with PCP.  Patient expressed understanding of return precautions and need for follow-up.  Patient discharged. The above care was discussed with and agreed upon by my attending physician. Emergency Department Medication Summary:  Medications  oxyCODONE-acetaminophen (PERCOCET/ROXICET) 5-325 MG per tablet 1.5 tablet (1.5 tablets Oral Given 04/13/19 1618)  lactated ringers bolus 750 mL (750 mLs Intravenous New Bag/Given 04/13/19 1619)   Final Clinical Impressions(s) / ED Diagnoses   Final diagnoses:  None    ED Discharge Orders    None       Lonzo Candy, MD 04/13/19 1730    Mesner, Corene Cornea, MD 04/14/19 0093

## 2019-04-16 ENCOUNTER — Other Ambulatory Visit: Payer: Self-pay | Admitting: Internal Medicine

## 2019-04-16 ENCOUNTER — Ambulatory Visit: Payer: Medicaid Other | Admitting: Pharmacist

## 2019-04-16 MED FILL — METOCLOPRAMIDE 10 MG TABLET: 10 | 3 days supply | Qty: 10 | Fill #1

## 2019-04-17 ENCOUNTER — Telehealth: Payer: Self-pay | Admitting: Internal Medicine

## 2019-04-17 DIAGNOSIS — Z20828 Contact with and (suspected) exposure to other viral communicable diseases: Secondary | ICD-10-CM

## 2019-04-17 DIAGNOSIS — Z20822 Contact with and (suspected) exposure to covid-19: Secondary | ICD-10-CM

## 2019-04-17 MED FILL — hydrOXYzine HCL 25 MG TABS: 25 | 8 days supply | Qty: 30 | Fill #0

## 2019-04-17 NOTE — Telephone Encounter (Signed)
PC placed to pt this afternoon after receiving message from clinical pharmacist Lurena Joiner, that pt was not seen today for anticoag clinic because he reported possible covid exposure with symptoms. 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  Brother-in-law tested positive for COVID yesterday and he last interacted with him this wk.  Pt reports symptoms of cough and rhinorrhea x 1 wk.  No fever or SOB.  He reported went to an UC facility today and was tested.  Told results will be back in several days. I advise pt to self quarantine.  He lives with his girlfriend.  Advise to stay in separate rooms of the house with limited physical interaction. Advise that if she comes into his room to bring med or food, she should wear a mask.  Advise against letting friends and family come to the house and if they do, they should wear mask.

## 2019-04-17 NOTE — Telephone Encounter (Signed)
-----   Message from Tresa Endo, RPH-CPP sent at 04/16/2019  4:52 PM EDT ----- Regarding: Possible Covid Dr. Wynetta Emery,   Had to turn Russell Carter away today for positive symptoms. Spoke with patient on the phone and he reports that his brother-in-law has confirmed covid-19 infection. I told him I would message you to let you know.

## 2019-04-21 ENCOUNTER — Ambulatory Visit: Payer: Medicaid Other | Attending: Internal Medicine | Admitting: Internal Medicine

## 2019-04-21 ENCOUNTER — Encounter: Payer: Self-pay | Admitting: Internal Medicine

## 2019-04-21 ENCOUNTER — Other Ambulatory Visit: Payer: Self-pay

## 2019-04-21 DIAGNOSIS — M5412 Radiculopathy, cervical region: Secondary | ICD-10-CM

## 2019-04-21 DIAGNOSIS — I2510 Atherosclerotic heart disease of native coronary artery without angina pectoris: Secondary | ICD-10-CM

## 2019-04-21 DIAGNOSIS — Z20822 Contact with and (suspected) exposure to covid-19: Secondary | ICD-10-CM

## 2019-04-21 DIAGNOSIS — R9431 Abnormal electrocardiogram [ECG] [EKG]: Secondary | ICD-10-CM

## 2019-04-21 DIAGNOSIS — Z20828 Contact with and (suspected) exposure to other viral communicable diseases: Secondary | ICD-10-CM

## 2019-04-21 DIAGNOSIS — F419 Anxiety disorder, unspecified: Secondary | ICD-10-CM

## 2019-04-21 DIAGNOSIS — Z86718 Personal history of other venous thrombosis and embolism: Secondary | ICD-10-CM

## 2019-04-21 DIAGNOSIS — I10 Essential (primary) hypertension: Secondary | ICD-10-CM

## 2019-04-21 MED ORDER — ROSUVASTATIN CALCIUM 40 MG PO TABS: 40.0000 mg | ORAL_TABLET | Freq: Every day | ORAL | 6 refills | Status: AC

## 2019-04-21 MED ORDER — HYDROXYZINE HCL 25 MG PO TABS: 25.0000 mg | ORAL_TABLET | Freq: Every day | ORAL | 1 refills | Status: DC | PRN

## 2019-04-21 MED FILL — ROSUVASTATIN CALCIUM 40 MG: 40 | 30 days supply | Qty: 30 | Fill #0

## 2019-04-21 NOTE — Progress Notes (Signed)
Virtual Visit via Telephone Note Due to current restrictions/limitations of in-office visits due to the COVID-19 pandemic, this scheduled clinical appointment was converted to a telehealth visit  I connected with Russell Carter on 04/21/19 at 8:39 a.m EDT by telephone and verified that I am speaking with the correct person using two identifiers. I am in my office.  The patient is at home.  Only the patient and myself participated in this encounter.  I discussed the limitations, risks, security and privacy concerns of performing an evaluation and management service by telephone and the availability of in person appointments. I also discussed with the patient that there may be a patient responsible charge related to this service. The patient expressed understanding and agreed to proceed.  History of Present Illness: Pt with hx ofpre-DM, HTN, HL, CAD,anxiety, gout, dep/anxiety, sz/pseudoseizure, substance abuse   COVID exposure:  Reports he is doing better.  Cough has resolved.  No SOB or fever.  He has not received the results of COVID test as yet.   Needs RF on Crestor  Seen in ED 04/13/2019 for LOC thought to be due to dehydration.  Pt states he was doing some work out doors and was not drinking enough fluids.  It was hot that day.  In the ER he had elevation of creatinine.  EKG showed prolonged QT interval compared to one done earlier in the month.  CT scan of the head revealed no acute findings.  CT of the neck revealed no fracture or subluxation of cervical spine  Hx of DVT:  INR done in ER 04/13/2019 was 2.1.  No bruising or bleeding.  HTN/CAD:  BP was 122/76 yesterday Compliant with meds and limits salt in foods No CP/SOB/LE edema  Chronic Neck pain:  Est at [facility] with Racheal McCoy.  He is on Percocets 3 times a day as needed.  History of anxiety: Requests refill on hydroxyzine which he takes as needed.  Patient states he takes 1 a day with good results.  Outpatient Encounter  Medications as of 04/21/2019  Medication Sig  . allopurinol (ZYLOPRIM) 300 MG tablet Take 1 tablet (300 mg total) by mouth daily.  Marland Kitchen amLODipine (NORVASC) 5 MG tablet Take 1 tablet (5 mg total) by mouth daily.  Marland Kitchen aspirin EC 81 MG tablet Take 1 tablet (81 mg total) by mouth daily.  . Calcium Carb-Cholecalciferol (CALCIUM-VITAMIN D) 600-400 MG-UNIT TABS Take 1 tablet by mouth daily.  . divalproex (DEPAKOTE) 500 MG DR tablet Take 1 tablet (500 mg total) by mouth 2 (two) times daily.  . DULoxetine (CYMBALTA) 30 MG capsule Take 1 capsule (30 mg total) by mouth 2 (two) times daily.  . folic acid (FOLVITE) 1 MG tablet Take 1 tablet (1 mg total) by mouth daily.  Marland Kitchen gabapentin (NEURONTIN) 400 MG capsule Take 1 capsule (400 mg total) by mouth 2 (two) times daily.  Marland Kitchen gabapentin (NEURONTIN) 600 MG tablet Take 600 mg by mouth 4 (four) times daily.  Marland Kitchen losartan (COZAAR) 100 MG tablet Take 1 tablet (100 mg total) by mouth daily.  . methocarbamol (ROBAXIN) 750 MG tablet Take 1 tablet (750 mg total) by mouth every 8 (eight) hours as needed for muscle spasms.  . metoCLOPramide (REGLAN) 10 MG tablet TAKE 1 TABLET BY MOUTH EVERY 6 HOURS AS NEEDED FOR NAUSEA OR HEADACHE.  . Multiple Vitamin (MULTIVITAMIN WITH MINERALS) TABS tablet Take 1 tablet by mouth daily.  . nitroGLYCERIN (NITROSTAT) 0.4 MG SL tablet Place 1 tablet (0.4 mg total) under  the tongue every 5 (five) minutes as needed for chest pain.  Marland Kitchen. oxyCODONE-acetaminophen (PERCOCET) 7.5-325 MG tablet Take 1 tablet by mouth 3 (three) times daily.  . rosuvastatin (CRESTOR) 40 MG tablet Take 1 tablet (40 mg total) by mouth daily.  . traZODone (DESYREL) 100 MG tablet Take 1 tablet (100 mg total) by mouth at bedtime as needed for sleep.  Marland Kitchen. warfarin (COUMADIN) 5 MG tablet Take 5 mg by mouth. As directed   No facility-administered encounter medications on file as of 04/21/2019.     Observations/Objective:  Results for orders placed or performed during the hospital  encounter of 04/13/19  CBC with Differential  Result Value Ref Range   WBC 5.0 4.0 - 10.5 K/uL   RBC 4.69 4.22 - 5.81 MIL/uL   Hemoglobin 13.9 13.0 - 17.0 g/dL   HCT 16.142.9 09.639.0 - 04.552.0 %   MCV 91.5 80.0 - 100.0 fL   MCH 29.6 26.0 - 34.0 pg   MCHC 32.4 30.0 - 36.0 g/dL   RDW 40.914.8 81.111.5 - 91.415.5 %   Platelets 192 150 - 400 K/uL   nRBC 0.0 0.0 - 0.2 %   Neutrophils Relative % 54 %   Neutro Abs 2.7 1.7 - 7.7 K/uL   Lymphocytes Relative 34 %   Lymphs Abs 1.7 0.7 - 4.0 K/uL   Monocytes Relative 11 %   Monocytes Absolute 0.5 0.1 - 1.0 K/uL   Eosinophils Relative 1 %   Eosinophils Absolute 0.0 0.0 - 0.5 K/uL   Basophils Relative 0 %   Basophils Absolute 0.0 0.0 - 0.1 K/uL   Immature Granulocytes 0 %   Abs Immature Granulocytes 0.02 0.00 - 0.07 K/uL  Basic metabolic panel  Result Value Ref Range   Sodium 138 135 - 145 mmol/L   Potassium 3.7 3.5 - 5.1 mmol/L   Chloride 108 98 - 111 mmol/L   CO2 21 (L) 22 - 32 mmol/L   Glucose, Bld 129 (H) 70 - 99 mg/dL   BUN 12 8 - 23 mg/dL   Creatinine, Ser 7.821.41 (H) 0.61 - 1.24 mg/dL   Calcium 8.1 (L) 8.9 - 10.3 mg/dL   GFR calc non Af Amer 52 (L) >60 mL/min   GFR calc Af Amer >60 >60 mL/min   Anion gap 9 5 - 15  Magnesium  Result Value Ref Range   Magnesium 1.9 1.7 - 2.4 mg/dL  Troponin I - ONCE - STAT  Result Value Ref Range   Troponin I <0.03 <0.03 ng/mL  Valproic acid level  Result Value Ref Range   Valproic Acid Lvl 78 50.0 - 100.0 ug/mL  Protime-INR  Result Value Ref Range   Prothrombin Time 23.1 (H) 11.4 - 15.2 seconds   INR 2.1 (H) 0.8 - 1.2  Brain natriuretic peptide  Result Value Ref Range   B Natriuretic Peptide 9.6 0.0 - 100.0 pg/mL  CBG monitoring, ED  Result Value Ref Range   Glucose-Capillary 129 (H) 70 - 99 mg/dL    Assessment and Plan: 1. Close Exposure to Covid-19 Virus Patient remains in self quarantine. Await results of COVID testing  2. Essential hypertension Home blood pressure reading at goal.  Continue  current medications and low-salt diet  3. Coronary artery disease involving native coronary artery of native heart without angina pectoris Clinically stable. - rosuvastatin (CRESTOR) 40 MG tablet; Take 1 tablet (40 mg total) by mouth daily.  Dispense: 30 tablet; Refill: 6  4. Prolonged Q-T interval on ECG We will have him  follow-up with the cardiologist on this. - Ambulatory referral to Cardiology  5. Cervical radiculopathy He is now plugged in with [facility] pain clinic.  On Percocet.  I do see a prescription for Narcan in the system also.  6. Anxiety disorder, unspecified type - hydrOXYzine (ATARAX/VISTARIL) 25 MG tablet; Take 1 tablet (25 mg total) by mouth daily as needed.  Dispense: 30 tablet; Refill: 1  7.  History of DVT On Coumadin with INR in target range.  He has a follow-up appointment with the clinical pharmacist in 3 weeks  Follow Up Instructions: Follow-up in 3 months   I discussed the assessment and treatment plan with the patient. The patient was provided an opportunity to ask questions and all were answered. The patient agreed with the plan and demonstrated an understanding of the instructions.   The patient was advised to call back or seek an in-person evaluation if the symptoms worsen or if the condition fails to improve as anticipated.  I provided 10 minutes of non-face-to-face time during this encounter.   Karle Plumber, MD

## 2019-04-21 NOTE — Progress Notes (Signed)
Patient verified DOB Patient has not taken medication. Patient has not eaten today. Patient complains of neck and back pain scaled at an 8. Patient complains of cough no runny nose, No fever, no N/V.

## 2019-05-03 IMAGING — US US ABDOMEN LIMITED
1 series · 14 of 25 positions shown · non-contrast
Comparison: None.

CLINICAL DATA: Upper abdominal pain for several days

EXAM:
ULTRASOUND ABDOMEN LIMITED RIGHT UPPER QUADRANT

[Series 1: us abdomen limited · 14 of 63 slices shown]
[im 1/63]
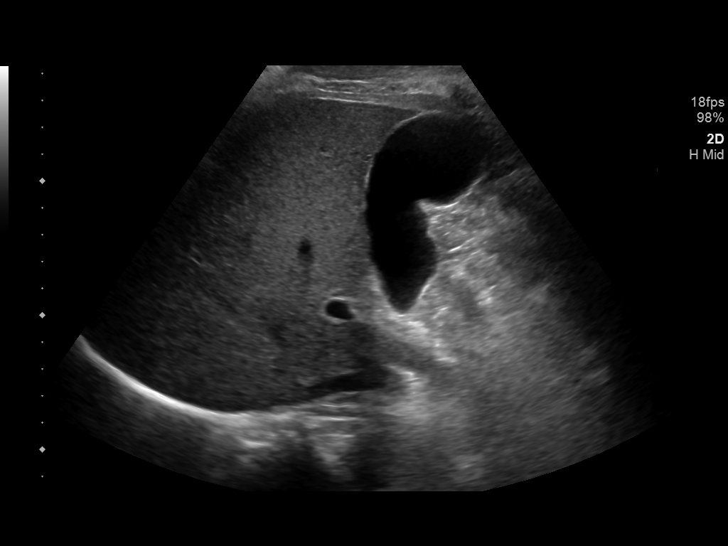
[im 6/63]
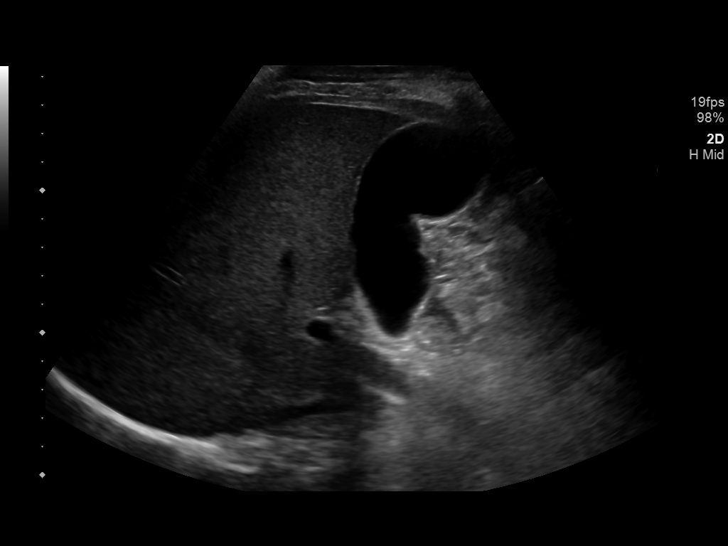
[im 11/63]
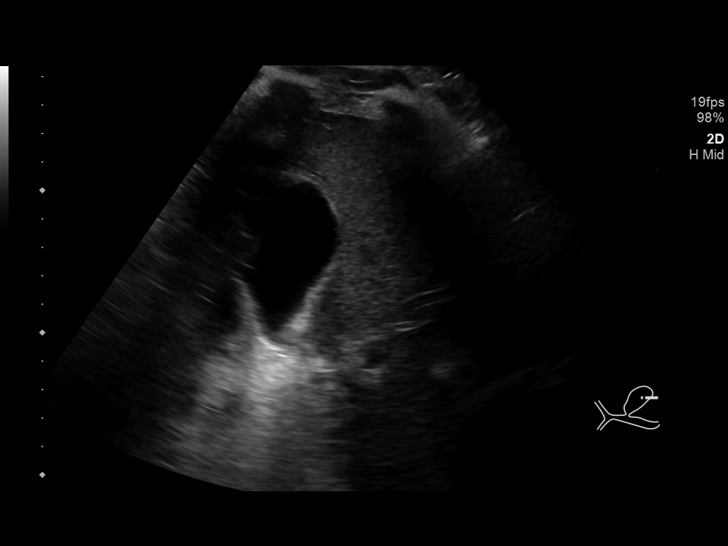
[im 16/63]
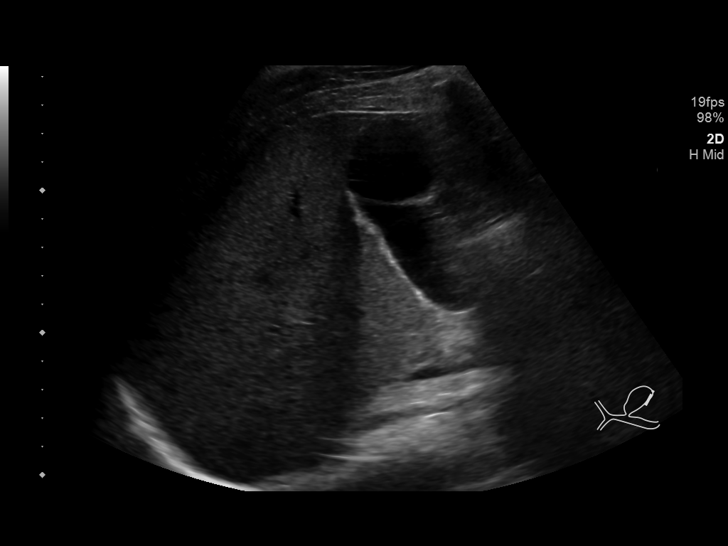
[im 21/63]
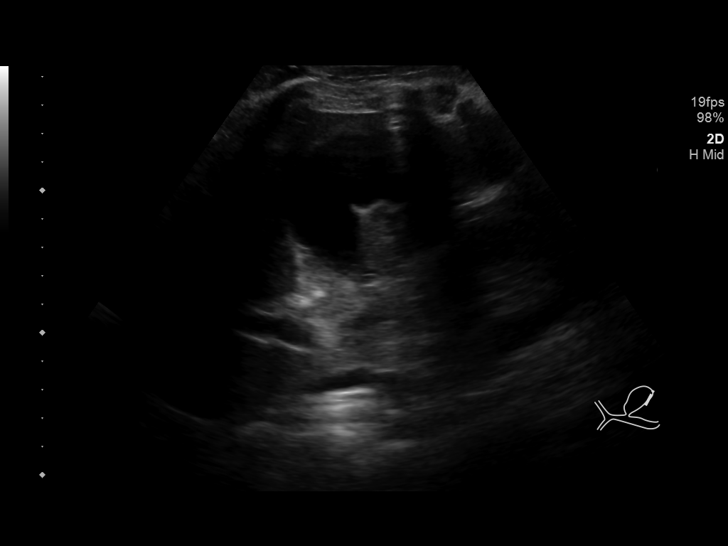
[im 24/63]
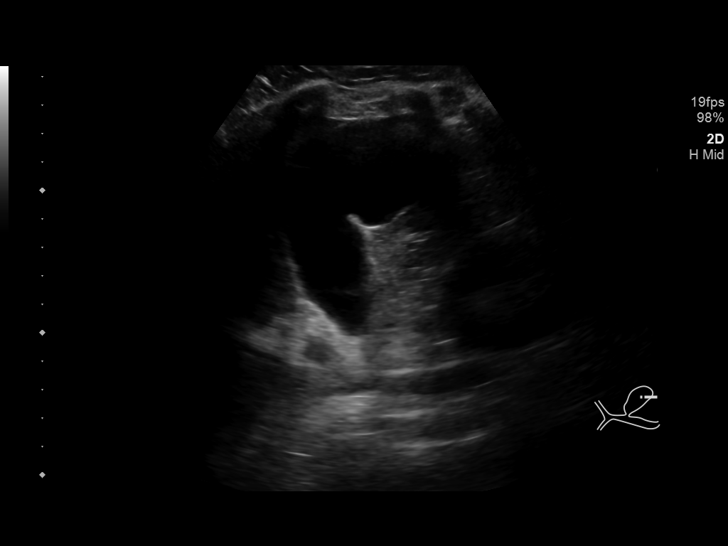
[im 29/63]
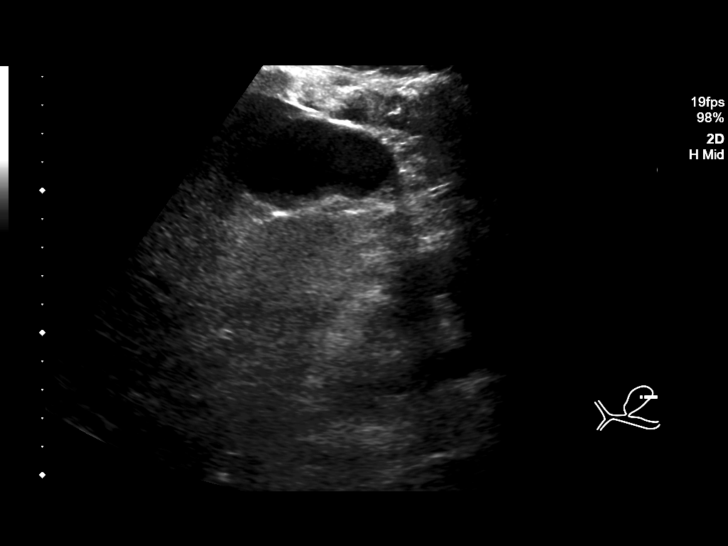
[im 34/63]
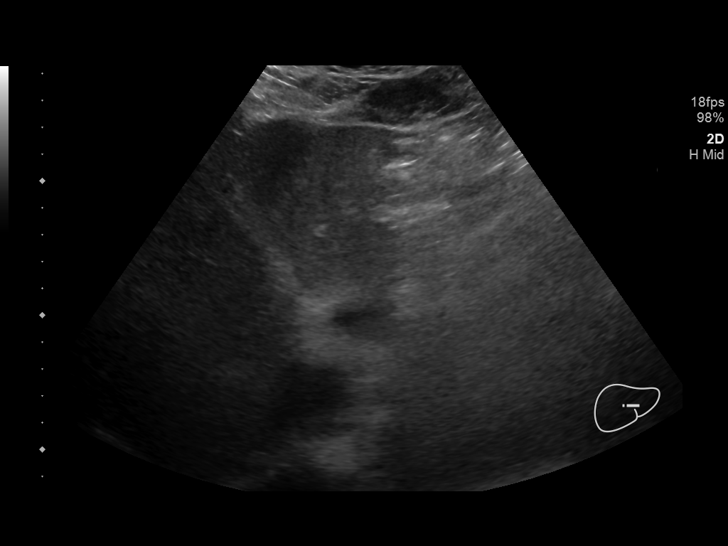
[im 39/63]
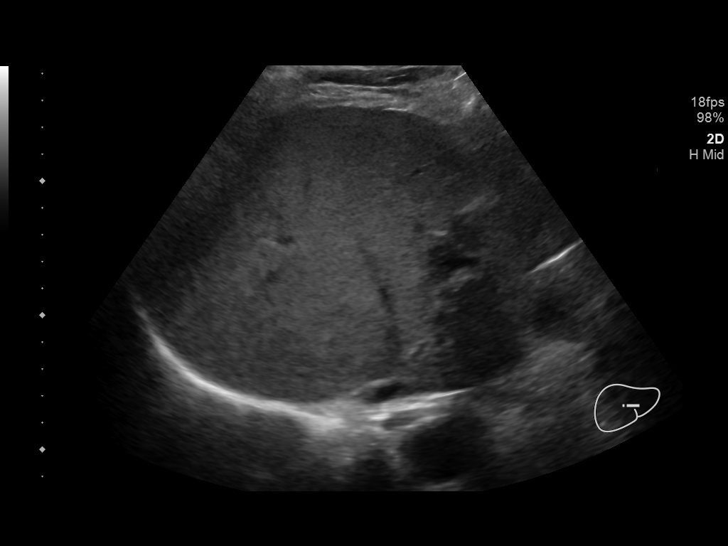
[im 42/63]
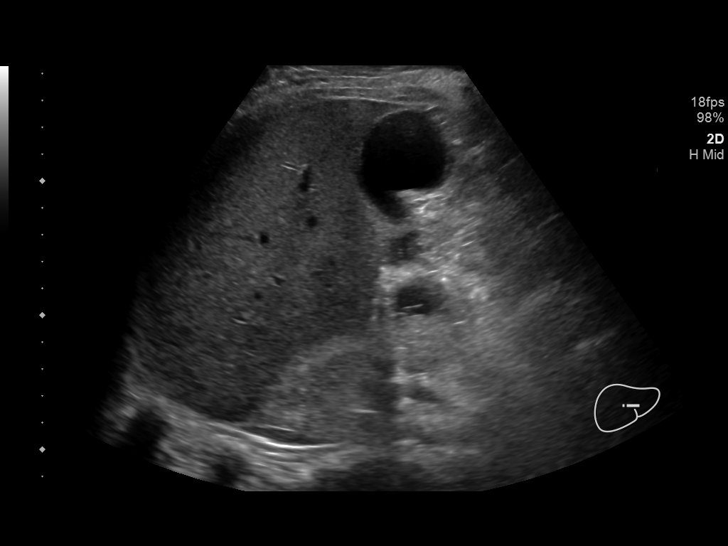
[im 47/63]
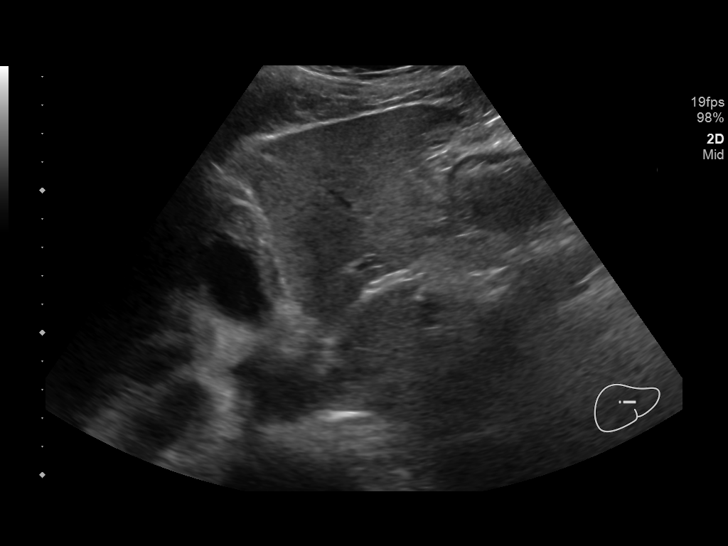
[im 52/63]
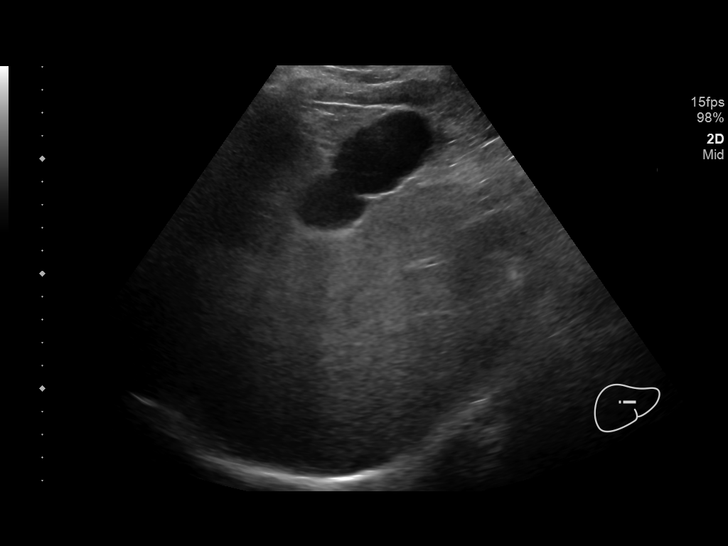
[im 57/63]
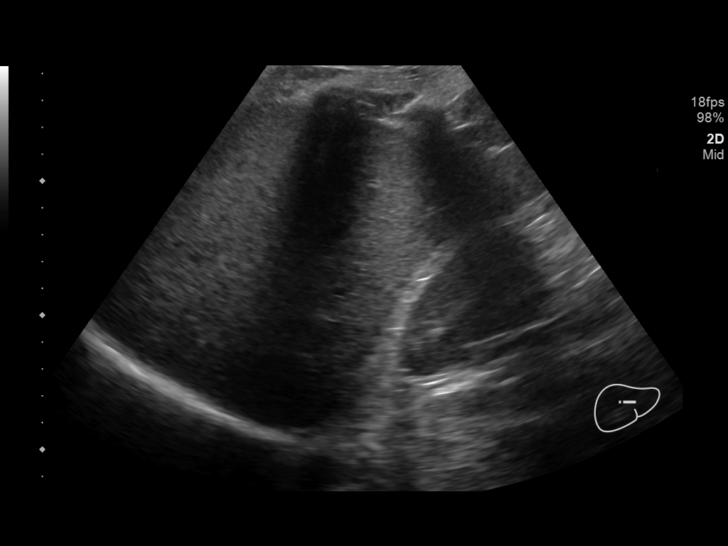
[im 63/63]
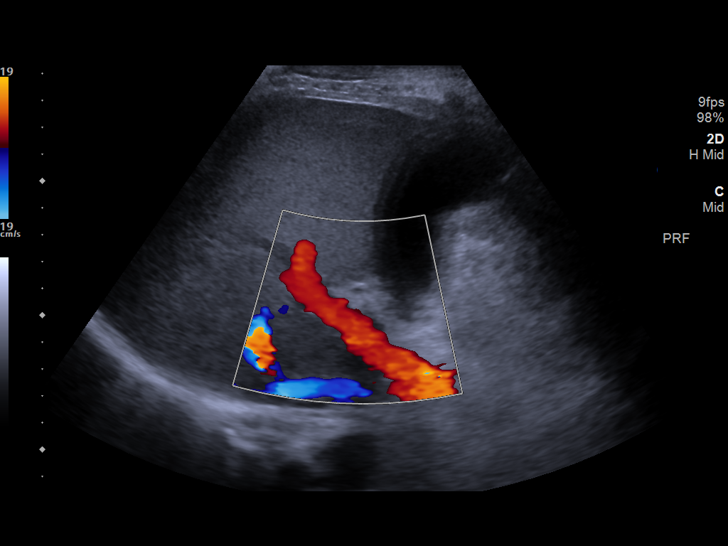

[14 of 25 positions shown; findings below may reference images not displayed]

FINDINGS: Gallbladder:

No gallstones or wall thickening visualized. No sonographic Murphy
sign noted by sonographer.

Common bile duct:

Diameter: 4.3 mm.

Liver:

No focal lesion identified. Within normal limits in parenchymal
echogenicity. Portal vein is patent on color Doppler imaging with
normal direction of blood flow towards the liver.
IMPRESSION: No acute abnormality noted.

## 2019-05-07 ENCOUNTER — Encounter: Payer: Medicaid Other | Admitting: Pharmacist

## 2019-05-10 ENCOUNTER — Emergency Department (HOSPITAL_COMMUNITY): Payer: Medicaid Other

## 2019-05-10 ENCOUNTER — Inpatient Hospital Stay (HOSPITAL_COMMUNITY)
Admission: EM | Admit: 2019-05-10 | Discharge: 2019-05-15 | DRG: 100 | Disposition: A | Discharge status: Home Health | Payer: Medicaid Other | Attending: Internal Medicine | Admitting: Internal Medicine

## 2019-05-10 ENCOUNTER — Other Ambulatory Visit: Payer: Self-pay

## 2019-05-10 ENCOUNTER — Encounter (HOSPITAL_COMMUNITY): Payer: Self-pay | Admitting: Pharmacy Technician

## 2019-05-10 DIAGNOSIS — T426X5A Adverse effect of other antiepileptic and sedative-hypnotic drugs, initial encounter: Secondary | ICD-10-CM | POA: Diagnosis present

## 2019-05-10 DIAGNOSIS — Z8673 Personal history of transient ischemic attack (TIA), and cerebral infarction without residual deficits: Secondary | ICD-10-CM

## 2019-05-10 DIAGNOSIS — F319 Bipolar disorder, unspecified: Secondary | ICD-10-CM | POA: Diagnosis present

## 2019-05-10 DIAGNOSIS — R569 Unspecified convulsions: Principal | ICD-10-CM

## 2019-05-10 DIAGNOSIS — H53462 Homonymous bilateral field defects, left side: Secondary | ICD-10-CM | POA: Diagnosis present

## 2019-05-10 DIAGNOSIS — N183 Chronic kidney disease, stage 3 unspecified: Secondary | ICD-10-CM | POA: Diagnosis present

## 2019-05-10 DIAGNOSIS — I2510 Atherosclerotic heart disease of native coronary artery without angina pectoris: Secondary | ICD-10-CM | POA: Diagnosis present

## 2019-05-10 DIAGNOSIS — G894 Chronic pain syndrome: Secondary | ICD-10-CM | POA: Diagnosis present

## 2019-05-10 DIAGNOSIS — E119 Type 2 diabetes mellitus without complications: Secondary | ICD-10-CM

## 2019-05-10 DIAGNOSIS — Z9861 Coronary angioplasty status: Secondary | ICD-10-CM

## 2019-05-10 DIAGNOSIS — E1122 Type 2 diabetes mellitus with diabetic chronic kidney disease: Secondary | ICD-10-CM | POA: Diagnosis present

## 2019-05-10 DIAGNOSIS — R6510 Systemic inflammatory response syndrome (SIRS) of non-infectious origin without acute organ dysfunction: Secondary | ICD-10-CM | POA: Diagnosis present

## 2019-05-10 DIAGNOSIS — G92 Toxic encephalopathy: Secondary | ICD-10-CM | POA: Diagnosis present

## 2019-05-10 DIAGNOSIS — I442 Atrioventricular block, complete: Secondary | ICD-10-CM | POA: Diagnosis present

## 2019-05-10 DIAGNOSIS — Z79899 Other long term (current) drug therapy: Secondary | ICD-10-CM

## 2019-05-10 DIAGNOSIS — F1910 Other psychoactive substance abuse, uncomplicated: Secondary | ICD-10-CM | POA: Diagnosis present

## 2019-05-10 DIAGNOSIS — E872 Acidosis: Secondary | ICD-10-CM | POA: Diagnosis present

## 2019-05-10 DIAGNOSIS — I252 Old myocardial infarction: Secondary | ICD-10-CM

## 2019-05-10 DIAGNOSIS — Z86711 Personal history of pulmonary embolism: Secondary | ICD-10-CM | POA: Diagnosis present

## 2019-05-10 DIAGNOSIS — E785 Hyperlipidemia, unspecified: Secondary | ICD-10-CM | POA: Diagnosis present

## 2019-05-10 DIAGNOSIS — I10 Essential (primary) hypertension: Secondary | ICD-10-CM | POA: Diagnosis present

## 2019-05-10 DIAGNOSIS — Y929 Unspecified place or not applicable: Secondary | ICD-10-CM

## 2019-05-10 DIAGNOSIS — Z8249 Family history of ischemic heart disease and other diseases of the circulatory system: Secondary | ICD-10-CM

## 2019-05-10 DIAGNOSIS — K219 Gastro-esophageal reflux disease without esophagitis: Secondary | ICD-10-CM | POA: Diagnosis present

## 2019-05-10 DIAGNOSIS — Z91013 Allergy to seafood: Secondary | ICD-10-CM

## 2019-05-10 DIAGNOSIS — I129 Hypertensive chronic kidney disease with stage 1 through stage 4 chronic kidney disease, or unspecified chronic kidney disease: Secondary | ICD-10-CM | POA: Diagnosis present

## 2019-05-10 DIAGNOSIS — M4722 Other spondylosis with radiculopathy, cervical region: Secondary | ICD-10-CM

## 2019-05-10 DIAGNOSIS — Z79891 Long term (current) use of opiate analgesic: Secondary | ICD-10-CM

## 2019-05-10 DIAGNOSIS — M109 Gout, unspecified: Secondary | ICD-10-CM | POA: Diagnosis present

## 2019-05-10 DIAGNOSIS — Z7901 Long term (current) use of anticoagulants: Secondary | ICD-10-CM

## 2019-05-10 DIAGNOSIS — M1990 Unspecified osteoarthritis, unspecified site: Secondary | ICD-10-CM | POA: Diagnosis present

## 2019-05-10 DIAGNOSIS — Z808 Family history of malignant neoplasm of other organs or systems: Secondary | ICD-10-CM

## 2019-05-10 DIAGNOSIS — E7800 Pure hypercholesterolemia, unspecified: Secondary | ICD-10-CM | POA: Diagnosis present

## 2019-05-10 DIAGNOSIS — W19XXXA Unspecified fall, initial encounter: Secondary | ICD-10-CM | POA: Diagnosis present

## 2019-05-10 DIAGNOSIS — R29810 Facial weakness: Secondary | ICD-10-CM | POA: Diagnosis present

## 2019-05-10 DIAGNOSIS — Z87892 Personal history of anaphylaxis: Secondary | ICD-10-CM

## 2019-05-10 DIAGNOSIS — Z7982 Long term (current) use of aspirin: Secondary | ICD-10-CM

## 2019-05-10 DIAGNOSIS — Z20828 Contact with and (suspected) exposure to other viral communicable diseases: Secondary | ICD-10-CM | POA: Diagnosis present

## 2019-05-10 DIAGNOSIS — Z981 Arthrodesis status: Secondary | ICD-10-CM

## 2019-05-10 DIAGNOSIS — R52 Pain, unspecified: Secondary | ICD-10-CM

## 2019-05-10 HISTORY — DX: Dizziness and giddiness: R42

## 2019-05-10 LAB — RAPID URINE DRUG SCREEN, HOSP PERFORMED
Amphetamines: NOT DETECTED
Barbiturates: NOT DETECTED
Benzodiazepines: NOT DETECTED
Cocaine: NOT DETECTED
Opiates: NOT DETECTED
Tetrahydrocannabinol: NOT DETECTED

## 2019-05-10 LAB — URINALYSIS, ROUTINE W REFLEX MICROSCOPIC
Bilirubin Urine: NEGATIVE
Glucose, UA: NEGATIVE mg/dL
Hgb urine dipstick: NEGATIVE
Ketones, ur: NEGATIVE mg/dL
Leukocytes,Ua: NEGATIVE
Nitrite: NEGATIVE
Protein, ur: 30 mg/dL — AB
Specific Gravity, Urine: 1.021 (ref 1.005–1.030)
pH: 5 (ref 5.0–8.0)

## 2019-05-10 MED ORDER — LEVETIRACETAM IN NACL 1000 MG/100ML IV SOLN
1000.0000 mg | Freq: Once | INTRAVENOUS | Status: AC
  Administered 2019-05-11: 1000 mg via INTRAVENOUS
  Filled 2019-05-10: qty 100

## 2019-05-10 MED ORDER — ACETAMINOPHEN 325 MG PO TABS
650.0000 mg | ORAL_TABLET | Freq: Once | ORAL | Status: AC
  Administered 2019-05-10: 650 mg via ORAL
  Filled 2019-05-10: qty 2

## 2019-05-10 MED ORDER — THIAMINE HCL 100 MG/ML IJ SOLN
100.0000 mg | Freq: Once | INTRAMUSCULAR | Status: AC
  Administered 2019-05-10: 100 mg via INTRAVENOUS
  Filled 2019-05-10: qty 2

## 2019-05-10 MED ORDER — LORAZEPAM 2 MG/ML IJ SOLN
INTRAMUSCULAR | Status: AC
  Administered 2019-05-10: 23:00:00 2 mg
  Filled 2019-05-10: qty 1

## 2019-05-10 NOTE — ED Notes (Signed)
This RN called by radiology dept stating pt was having full body seizure. MD notified and Ativan 2mg  verbal order.

## 2019-05-10 NOTE — ED Notes (Signed)
Pt now states he fell earlier in his room and hit his head on the headboard. Pt also states his vision is coming back but it is blurry.

## 2019-05-10 NOTE — ED Provider Notes (Addendum)
[facility] EMERGENCY DEPARTMENT Provider Note   CSN: [id number] Arrival date & time: 05/10/19  2220    History   Chief Complaint Chief Complaint  Patient presents with   Seizures    HPI Russell Carter is a 63 y.o. male.     HPI   Pt is a 63 y/o male with a h/o bipolar disorder, CAD, CKD, DM, HLD, HTN, CVA, polysubstance abuse, seizures who presents to the ED today for eval of seizures.   Pt is a difficult historian and therefore history is limited however he states that he has been feeling intermittently dizzy for the last several days and today he felt dizzy so he walked into his room to lay down in his bed however had a syncopal episode, fell and hit his head on his headboard.  States that he lost consciousness.  States that following this he developed a headache and blurred vision bilaterally.  He states that he had several seizures at home.  He denies any urinary incontinence or tongue biting.  States that he has neck pain and mid and lower back pain from the fall.  He is also complaining that the left side of his face feels numb.  He initially denied any weakness to the left lower extremity however later informed my supervising physician that he had had weakness for several days.   Past Medical History:  Diagnosis Date   Acute kidney injury ([place]) 04/27/2018   AKI (acute kidney injury) ([place])    Anginal pain ([place])    Anxiety    Arthritis    Bipolar disorder ([place])    Blood in stool    CAD (coronary artery disease)    a. 2011: cath showing mod non-obstructive disease b. 02/2016: cath with 95% stenosis in the Mid RCA. DES placed.   Cervical stenosis of spine    Chronic anticoagulation 04/27/2018   Chronic pain disorder 06/05/2017   CKD (chronic kidney disease), stage III ([place]) 05/06/2018   Cocaine abuse ([place])    history   Complete heart block ([place]) 03/14/2016   Depression    Diabetes mellitus type 2 in nonobese [facility])    Diabetes mellitus  without complication ([place])    Dyslipidemia 03/15/2016   Essential hypertension 05/18/2009   Qualifier: Diagnosis of  By: Burnett Kanaris     Gastroesophageal reflux disease without esophagitis 07/31/2017   Gout, unspecified    Headache    History of CVA (cerebrovascular accident)    History of pulmonary embolus (PE) 02/14/2017   HTN (hypertension)    Hypercholesterolemia    Hyperglycemia    Leukopenia 05/06/2018   Migraines    Myocardial infarction ([place]) 2008   drug cocaine and marijuana at time of mi none  since     Normocytic anemia 05/06/2018   Pneumonia    Polysubstance abuse ([place]) 01/15/2013   Rectal bleeding    Renal insufficiency    Seizure ([place]) 05/06/2018   Seizures ([place])    Stroke [facility])     Patient Active Problem List   Diagnosis Date Noted   Stroke ([place]) 12/24/2018   CKD (chronic kidney disease), stage III ([place]) 05/06/2018   Seizure ([place]) 05/06/2018   Normocytic anemia 05/06/2018   Leukopenia 05/06/2018   AKI (acute kidney injury) ([place])    Acute kidney injury ([place]) 04/27/2018   Abdominal pain 04/27/2018   Chronic anticoagulation 04/27/2018   Left knee pain 01/10/2018   Numbness and tingling in both hands 07/31/2017   Gastroesophageal reflux disease without  esophagitis 07/31/2017   Chronic pain disorder 06/05/2017   History of pulmonary embolus (PE) 02/14/2017   Diabetes mellitus type 2 in nonobese [facility])    Cocaine abuse ([place])    Depression    History of CVA (cerebrovascular accident)    Cervical spondylosis with radiculopathy 01/24/2017   Dyslipidemia 03/15/2016   Cervical spinal stenosis 03/14/2016   CAD-50% LAD 2011 12/14/2014   Gout 11/16/2014   Polysubstance abuse ([place]) 01/15/2013   DIZZINESS 06/05/2010   Essential hypertension 05/18/2009    Past Surgical History:  Procedure Laterality Date   ANTERIOR CERVICAL DECOMP/DISCECTOMY FUSION N/A 01/24/2017   Procedure: Cervical Four-Five,Cervical Five  Six,Cervical Six-Seven Anterior cervical discectomy with fusion and plate with Cervical Three-Thoracic- One Laminectomy/Foraminotomy with Cervical Three-Thoracic- One Fixation and fusion;  Surgeon: Kevan Ny Ditty, MD;  Location: [place];  Service: Neurosurgery;  Laterality: N/A;  Part-1 anterior approach   CARDIAC CATHETERIZATION  2008; ~ 2011   [place]-40%lad   CARDIAC CATHETERIZATION N/A 03/16/2016   Procedure: Left Heart Cath and Coronary Angiography;  Surgeon: Leonie Man, MD;  Location: [place] CV LAB;  Service: Cardiovascular;  Laterality: N/A;   COLONOSCOPY WITH PROPOFOL N/A 04/17/2018   Procedure: COLONOSCOPY WITH PROPOFOL;  Surgeon: Wonda Horner, MD;  Location: [facility] ENDOSCOPY;  Service: Endoscopy;  Laterality: N/A;   CORONARY ANGIOPLASTY     STENTS   EYE SURGERY Bilateral    "for double vision"   INGUINAL HERNIA REPAIR  05/12/2012   Procedure: HERNIA REPAIR INGUINAL ADULT;  Surgeon: Zenovia Jarred, MD;  Location: [place];  Service: General;  Laterality: Right;   INGUINAL HERNIA REPAIR Left 02/02/2003   Dr. Georganna Skeans. repair with mesh. Same procedure done again in  05/22/2004    INGUINAL HERNIA REPAIR Left ?date 2nd OR   LEFT HEART CATH AND CORONARY ANGIOGRAPHY N/A 04/29/2018   Procedure: LEFT HEART CATH AND CORONARY ANGIOGRAPHY;  Surgeon: Martinique, Peter M, MD;  Location: [place] CV LAB;  Service: Cardiovascular;  Laterality: N/A;   POSTERIOR CERVICAL FUSION/FORAMINOTOMY N/A 01/24/2017   Procedure: Cervical Three-Thoracic One-Laminectomy/Foraminotomy with Cervical Three-Thoracic One Fixation and fusion;  Surgeon: Kevan Ny Ditty, MD;  Location: [place];  Service: Neurosurgery;  Laterality: N/A;  Part-2 Posterior approach   RECONSTRUCT / STABILIZE DISTAL ULNA     SHOULDER OPEN ROTATOR CUFF REPAIR Left 2010   SHOULDER SURGERY Left    "injured on the job; had to cut small bone out"   THUMB FUSION Right         Home Medications     Prior to Admission medications   Medication Sig Start Date End Date Taking? Authorizing Provider  allopurinol (ZYLOPRIM) 300 MG tablet Take 1 tablet (300 mg total) by mouth daily. 02/18/19   Ladell Pier, MD  amLODipine (NORVASC) 5 MG tablet Take 1 tablet (5 mg total) by mouth daily. 12/18/18   Ladell Pier, MD  aspirin EC 81 MG tablet Take 1 tablet (81 mg total) by mouth daily. 12/26/18 12/26/19  Vonzella Nipple, NP  Calcium Carb-Cholecalciferol (CALCIUM-VITAMIN D) 600-400 MG-UNIT TABS Take 1 tablet by mouth daily. 05/07/18   Roxan Hockey, MD  divalproex (DEPAKOTE) 500 MG DR tablet Take 1 tablet (500 mg total) by mouth 2 (two) times daily. 02/20/19   Ladell Pier, MD  DULoxetine (CYMBALTA) 30 MG capsule Take 1 capsule (30 mg total) by mouth 2 (two) times daily. 01/20/19   Ladell Pier, MD  folic acid (FOLVITE) 1 MG tablet  Take 1 tablet (1 mg total) by mouth daily. 05/07/18   Roxan Hockey, MD  gabapentin (NEURONTIN) 400 MG capsule Take 1 capsule (400 mg total) by mouth 2 (two) times daily. 05/07/18   Roxan Hockey, MD  gabapentin (NEURONTIN) 600 MG tablet Take 600 mg by mouth 4 (four) times daily. 07/24/18   [provider]  hydrOXYzine (ATARAX/VISTARIL) 25 MG tablet Take 1 tablet (25 mg total) by mouth daily as needed. 04/21/19   Ladell Pier, MD  losartan (COZAAR) 100 MG tablet Take 1 tablet (100 mg total) by mouth daily. 12/18/18   Ladell Pier, MD  methocarbamol (ROBAXIN) 750 MG tablet Take 1 tablet (750 mg total) by mouth every 8 (eight) hours as needed for muscle spasms. 05/07/18   Roxan Hockey, MD  metoCLOPramide (REGLAN) 10 MG tablet TAKE 1 TABLET BY MOUTH EVERY 6 HOURS AS NEEDED FOR NAUSEA OR HEADACHE. 01/05/19   Ladell Pier, MD  Multiple Vitamin (MULTIVITAMIN WITH MINERALS) TABS tablet Take 1 tablet by mouth daily. 05/07/18   Roxan Hockey, MD  NARCAN 4 MG/0.1ML LIQD nasal spray kit USE 1 (ONE) SPRAY AS NEEDED 02/12/19   [provider]  nitroGLYCERIN (NITROSTAT) 0.4 MG SL tablet Place 1 tablet (0.4 mg total) under the tongue every 5 (five) minutes as needed for chest pain. 02/20/19   Ladell Pier, MD  oxyCODONE-acetaminophen (PERCOCET) 7.5-325 MG tablet Take 1 tablet by mouth 3 (three) times daily. 12/26/18 12/26/19  Vonzella Nipple, NP  rosuvastatin (CRESTOR) 40 MG tablet Take 1 tablet (40 mg total) by mouth daily. 04/21/19   Ladell Pier, MD  traZODone (DESYREL) 100 MG tablet Take 1 tablet (100 mg total) by mouth at bedtime as needed for sleep. 05/07/18   Roxan Hockey, MD  warfarin (COUMADIN) 5 MG tablet Take 5 mg by mouth. As directed 01/01/19   [provider]    Family History Family History  Problem Relation Age of Onset   Heart attack Mother        x7. Still alive   Heart attack Father        deceased b/c of MI   Heart attack Brother        older, deceased b/c of MI    Cancer Brother        bone cancer in 1 brother, ? brain cancer in other brother   Heart attack Brother    Other Brother        MVA   Other Brother    Bone cancer Brother    Cancer Brother    SIDS Sister     Social History Social History   Tobacco Use   Smoking status: Never Smoker   Smokeless tobacco: Never Used  Substance Use Topics   Alcohol use: Yes    Comment: 16 oz beer every 2-3 days   Drug use: Yes    Types: "Crack" cocaine, Marijuana, Cocaine    Comment:  "quit 2008"     Allergies   Shellfish allergy   Review of Systems Review of Systems   Physical Exam Updated Vital Signs BP 138/89 (BP Location: Left Arm)    Pulse 83    Temp (!) 97.1 F (36.2 C) (Axillary)    Resp (!) 21    Ht '5\' 11"'$  (1.803 m)    Wt 92.1 kg    SpO2 96%    BMI 28.31 kg/m   Physical Exam Vitals signs and nursing note reviewed.  Constitutional:  Appearance: He is well-developed.  HENT:     Head: Normocephalic and atraumatic.  Eyes:     Conjunctiva/sclera: Conjunctivae normal.     Pupils:  Pupils are equal, round, and reactive to light.     Comments: Left eye does not cross midline  Neck:     Musculoskeletal: Neck supple.  Cardiovascular:     Rate and Rhythm: Regular rhythm. Tachycardia present.     Heart sounds: Normal heart sounds. No murmur.  Pulmonary:     Effort: Pulmonary effort is normal. No respiratory distress.     Breath sounds: Normal breath sounds. No wheezing, rhonchi or rales.  Abdominal:     General: Bowel sounds are normal. There is no distension.     Palpations: Abdomen is soft.     Tenderness: There is no abdominal tenderness. There is no guarding or rebound.  Musculoskeletal:     Right lower leg: No edema.     Left lower leg: No edema.  Skin:    General: Skin is warm and dry.  Neurological:     Mental Status: He is alert.     Comments: Mental Status:  Alert, thought content appropriate, able to give a coherent history. Speech fluent without evidence of aphasia. Able to follow 2 step commands without difficulty.  Cranial Nerves:  II:  Peripheral visual fields grossly normal, pupils equal, round, reactive to light III,IV, VI: ptosis not present, left eye does not cross midline V,VII: smile symmetric, facial light touch sensation equal VIII: hearing grossly normal to voice  X: uvula elevates symmetrically  XI: bilateral shoulder shrug symmetric and strong XII: midline tongue extension without fassiculations Motor:  Normal tone. 5/5 strength to RUE and BLE. LUE has 1/5 strength (per patient, this is baseline) Sensory: left sided sensory deficit  Gait: not assessed CV: 2+ radial and DP pulses No nuchal rigidity or meningeal signs      ED Treatments / Results  Labs (all labs ordered are listed, but only abnormal results are displayed) Labs Reviewed  COMPREHENSIVE METABOLIC PANEL - Abnormal; Notable for the following components:      Result Value   CO2 19 (*)    Glucose, Bld 143 (*)    Creatinine, Ser 1.25 (*)    All other components within  normal limits  URINALYSIS, ROUTINE W REFLEX MICROSCOPIC - Abnormal; Notable for the following components:   Protein, ur 30 (*)    Bacteria, UA RARE (*)    All other components within normal limits  PROTIME-INR - Abnormal; Notable for the following components:   Prothrombin Time 16.6 (*)    INR 1.4 (*)    All other components within normal limits  LACTIC ACID, PLASMA - Abnormal; Notable for the following components:   Lactic Acid, Venous 3.6 (*)    All other components within normal limits  VALPROIC ACID LEVEL - Abnormal; Notable for the following components:   Valproic Acid Lvl 36 (*)    All other components within normal limits  COMPREHENSIVE METABOLIC PANEL - Abnormal; Notable for the following components:   CO2 21 (*)    Glucose, Bld 121 (*)    Calcium 8.4 (*)    Total Protein 6.3 (*)    Albumin 3.2 (*)    All other components within normal limits  GLUCOSE, CAPILLARY - Abnormal; Notable for the following components:   Glucose-Capillary 120 (*)    All other components within normal limits  CBG MONITORING, ED - Abnormal; Notable for the following components:  Glucose-Capillary 216 (*)    All other components within normal limits  SARS CORONAVIRUS 2 (HOSPITAL ORDER, PERFORMED IN [facility] LAB)  CULTURE, BLOOD (ROUTINE X 2)  CULTURE, BLOOD (ROUTINE X 2)  URINE CULTURE  CBC WITH DIFFERENTIAL/PLATELET  RAPID URINE DRUG SCREEN, HOSP PERFORMED  ETHANOL  LACTIC ACID, PLASMA  APTT  CBC WITH DIFFERENTIAL/PLATELET  LACTIC ACID, PLASMA  HIV ANTIBODY (ROUTINE TESTING W REFLEX)  LACTIC ACID, PLASMA    EKG EKG Interpretation  Date/Time:  Sunday May 10 2019 22:26:09 EDT Ventricular Rate:  109 PR Interval:    QRS Duration: 86 QT Interval:  323 QTC Calculation: 435 R Axis:   45 Text Interpretation:  Sinus tachycardia Probable left atrial enlargement Rate faster than previous Confirmed by Pryor Curia [phone]) on 05/10/2019 11:09:20 PM   Radiology Dg Thoracic Spine  2 View  Result Date: 05/11/2019 CLINICAL DATA:  Back pain EXAM: THORACIC SPINE 2 VIEWS COMPARISON:  None. FINDINGS: There is no evidence of thoracic spine fracture. Alignment is normal. No other significant bone abnormalities are identified. IMPRESSION: Negative. Electronically Signed   By: Ulyses Jarred M.D.   On: 05/11/2019 01:11   Dg Lumbar Spine Complete  Result Date: 05/11/2019 CLINICAL DATA:  63 year old male with back pain. EXAM: LUMBAR SPINE - COMPLETE 4+ VIEW COMPARISON:  Abdominal radiograph dated 05/06/2018 and CT dated 04/30/2018 FINDINGS: There is no acute fracture or subluxation of the lumbar spine. The vertebral body heights and disc spaces are maintained. Multilevel facet arthropathy. Grade 1 L3-L4 retrolisthesis. The soft tissues are unremarkable. IMPRESSION: 1. No acute/traumatic lumbar spine pathology. 2. Degenerative changes. Electronically Signed   By: Anner Crete M.D.   On: 05/11/2019 01:12   Ct Head Wo Contrast  Result Date: 05/11/2019 CLINICAL DATA:  Headache, post traumatic; C-spine fx, traumatic headache and neck pain. Seizure. EXAM: CT HEAD WITHOUT CONTRAST CT CERVICAL SPINE WITHOUT CONTRAST TECHNIQUE: Multidetector CT imaging of the head and cervical spine was performed following the standard protocol without intravenous contrast. Multiplanar CT image reconstructions of the cervical spine were also generated. COMPARISON:  Multiple prior exams most recently head and cervical spine CT 04/13/2019 FINDINGS: CT HEAD FINDINGS Brain: No intracranial hemorrhage, mass effect, or midline shift. Age related atrophy. Similar chronic small vessel ischemia to prior. No hydrocephalus. The basilar cisterns are patent. No evidence of territorial infarct or acute ischemia. No extra-axial or intracranial fluid collection. Vascular: No hyperdense vessel. Skull: No fracture or focal lesion. Sinuses/Orbits: Fiero mucosal thickening of ethmoid air cells. No sinus fluid levels. Mastoid air cells  are clear. Orbits are unremarkable. Other: None. CT CERVICAL SPINE FINDINGS Alignment: Postsurgical straightening. No traumatic subluxation. Skull base and vertebrae: No acute fracture. Dens and skull base are intact. Posterior C3 through T1 fusion and laminectomy. Anterior C4 through C7 fusion. No acute fracture. The dens and skull base are intact. Streak artifact from surgical hardware. Soft tissues and spinal canal: No prevertebral soft tissue edema. Canal partially obscured by streak artifact. Disc levels: Anterior fusion with discectomy C4-C5 through C6-C7. Mild endplate spurring G1-W2. Upper chest: No acute findings. Other: None. IMPRESSION: 1. No acute intracranial abnormality. No skull fracture. Mild chronic small vessel ischemia is unchanged from prior. 2. Postsurgical change in the cervical spine without acute fracture or subluxation. Electronically Signed   By: Keith Rake M.D.   On: 05/11/2019 01:20   Ct Cervical Spine Wo Contrast  Result Date: 05/11/2019 CLINICAL DATA:  Headache, post traumatic; C-spine fx, traumatic headache and neck pain. Seizure.  EXAM: CT HEAD WITHOUT CONTRAST CT CERVICAL SPINE WITHOUT CONTRAST TECHNIQUE: Multidetector CT imaging of the head and cervical spine was performed following the standard protocol without intravenous contrast. Multiplanar CT image reconstructions of the cervical spine were also generated. COMPARISON:  Multiple prior exams most recently head and cervical spine CT 04/13/2019 FINDINGS: CT HEAD FINDINGS Brain: No intracranial hemorrhage, mass effect, or midline shift. Age related atrophy. Similar chronic small vessel ischemia to prior. No hydrocephalus. The basilar cisterns are patent. No evidence of territorial infarct or acute ischemia. No extra-axial or intracranial fluid collection. Vascular: No hyperdense vessel. Skull: No fracture or focal lesion. Sinuses/Orbits: [place] mucosal thickening of ethmoid air cells. No sinus fluid levels. Mastoid air  cells are clear. Orbits are unremarkable. Other: None. CT CERVICAL SPINE FINDINGS Alignment: Postsurgical straightening. No traumatic subluxation. Skull base and vertebrae: No acute fracture. Dens and skull base are intact. Posterior C3 through T1 fusion and laminectomy. Anterior C4 through C7 fusion. No acute fracture. The dens and skull base are intact. Streak artifact from surgical hardware. Soft tissues and spinal canal: No prevertebral soft tissue edema. Canal partially obscured by streak artifact. Disc levels: Anterior fusion with discectomy C4-C5 through C6-C7. Mild endplate spurring Z6-X0. Upper chest: No acute findings. Other: None. IMPRESSION: 1. No acute intracranial abnormality. No skull fracture. Mild chronic small vessel ischemia is unchanged from prior. 2. Postsurgical change in the cervical spine without acute fracture or subluxation. Electronically Signed   By: Keith Rake M.D.   On: 05/11/2019 01:20   Dg Chest Portable 1 View  Result Date: 05/11/2019 CLINICAL DATA:  63 year old male with cough EXAM: PORTABLE CHEST 1 VIEW COMPARISON:  Chest radiograph dated 04/09/2019 FINDINGS: Shallow inspiration. There is mild eventration of the left hemidiaphragm with associated subsegmental atelectasis. No focal consolidation, pleural effusion, or pneumothorax. The cardiac silhouette is within normal limits. No acute osseous pathology. Extensive cervical spine fixation hardware. IMPRESSION: No active disease. Electronically Signed   By: Anner Crete M.D.   On: 05/11/2019 00:29    Procedures Procedures (including critical care time) CRITICAL CARE Performed by: Rodney Booze   Total critical care time: 32 minutes  Critical care time was exclusive of separately billable procedures and treating other patients.  Critical care was necessary to treat or prevent imminent or life-threatening deterioration.  Critical care was time spent personally by me on the following activities:  development of treatment plan with patient and/or surrogate as well as nursing, discussions with consultants, evaluation of patient's response to treatment, examination of patient, obtaining history from patient or surrogate, ordering and performing treatments and interventions, ordering and review of laboratory studies, ordering and review of radiographic studies, pulse oximetry and re-evaluation of patient's condition.   Medications Ordered in ED Medications  divalproex (DEPAKOTE) DR tablet 500 mg (has no administration in time range)  allopurinol (ZYLOPRIM) tablet 300 mg (has no administration in time range)  aspirin EC tablet 81 mg (has no administration in time range)  oxyCODONE-acetaminophen (PERCOCET) 7.5-325 MG per tablet 1 tablet (has no administration in time range)  amLODipine (NORVASC) tablet 5 mg (has no administration in time range)  losartan (COZAAR) tablet 100 mg (has no administration in time range)  nitroGLYCERIN (NITROSTAT) SL tablet 0.4 mg (has no administration in time range)  rosuvastatin (CRESTOR) tablet 40 mg (has no administration in time range)  DULoxetine (CYMBALTA) DR capsule 30 mg (has no administration in time range)  traZODone (DESYREL) tablet 100 mg (has no administration in time range)  folic  acid (FOLVITE) tablet 1 mg (has no administration in time range)  acetaminophen (TYLENOL) tablet 650 mg (has no administration in time range)    Or  acetaminophen (TYLENOL) suppository 650 mg (has no administration in time range)  ondansetron (ZOFRAN) tablet 4 mg (has no administration in time range)    Or  ondansetron (ZOFRAN) injection 4 mg (has no administration in time range)  0.9 %  sodium chloride infusion ( Intravenous New Bag/Given 05/11/19 0517)  Warfarin - Pharmacist Dosing Inpatient (has no administration in time range)  warfarin (COUMADIN) tablet 7.5 mg (has no administration in time range)  vancomycin (VANCOCIN) 1,250 mg in sodium chloride 0.9 % 250 mL IVPB  (has no administration in time range)  ceFEPIme (MAXIPIME) 2 g in sodium chloride 0.9 % 100 mL IVPB (has no administration in time range)  LORazepam (ATIVAN) 2 MG/ML injection (2 mg  Given 05/10/19 2312)  levETIRAcetam (KEPPRA) IVPB 1000 mg/100 mL premix (0 mg Intravenous Stopped 05/11/19 0107)  thiamine (B-1) injection 100 mg (100 mg Intravenous Given 05/10/19 2359)  acetaminophen (TYLENOL) tablet 650 mg (650 mg Oral Given 05/10/19 2359)  LORazepam (ATIVAN) 2 MG/ML injection (  Given 05/11/19 0021)  sodium chloride 0.9 % bolus 1,000 mL (0 mLs Intravenous Stopped 05/11/19 0514)  ceFEPIme (MAXIPIME) 2 g in sodium chloride 0.9 % 100 mL IVPB (0 g Intravenous Stopped 05/11/19 0502)  metroNIDAZOLE (FLAGYL) IVPB 500 mg (0 mg Intravenous Stopped 05/11/19 0502)  vancomycin (VANCOCIN) 1,500 mg in sodium chloride 0.9 % 500 mL IVPB (0 mg Intravenous Stopped 05/11/19 0502)  valproate (DEPACON) 1,750 mg in dextrose 5 % 50 mL IVPB (0 mg Intravenous Stopped 05/11/19 0503)     Initial Impression / Assessment and Plan / ED Course  I have reviewed the triage vital signs and the nursing notes.  Pertinent labs & imaging results that were available during my care of the patient were reviewed by me and considered in my medical decision making (see chart for details).    Final Clinical Impressions(s) / ED Diagnoses   Final diagnoses:  Seizure ([place])   63 year old male presenting for evaluation of seizures.  Per EMS patient had 4 seizures prior to arrival, 1 of which was witnessed by EMS and lasted about 30 seconds.  Has returned to baseline between seizures in the ED. He has had 2 more while in the ED, but has returned to baseline between both seizures.  CBC reassuring CMP with low bicarb, would be expected in setting of seizure PT-INR at 1.4 ETOH negative UDS negative UA w/o evidence of infection Lactic acid elevated at 3.6 COVID is negative.  11:00 PM update by nursing staff, patient having generalized  seizure-like activity while in x-ray.  Dr. Leonides Schanz evaluated patient at bedside.  She noted that patient had no postictal state and immediately following seizure patient was alert and oriented x3.  12:00 AM reassessed patient.  He is having generalized tonic clonic movement.  This lasted no more than 1 minute.  Following this episode, patient was immediately able to state his name, location, the date and his reason for being in the department.  1:05 AM patient evaluated at bedside.  He is sleeping but arouses easily to voice.  He remains oriented to person, place and situation.  2:58 AM Attempted to call the patients daughter to provide update but was sent to voicemail and the voice mailbox was not set up in order to leave a message.   MDM: Patient will require admission for further  treatment regarding multiple breakthrough seizures and new left-sided deficits. he has received IV ativan and has been loaded with keppra. Dr. Cheral Marker with neurology has been consulted by Dr. Leonides Schanz.  Additionally, there also concern for possible sepsis of unclear etiology. Has upper respiratory sxs and close exposure to COVID-19. He has received broad spectrum abx and 1L fluid bolus. Pt not hypotensive and lactic <4. Will hold on 30cc/kg bolus.  Pt seen in conjunction with Dr. Leonides Schanz who personally evaluated the pt and is in agreement with the plan.   CONSULT With Dr. Hal Hope who accepts pt for admission.    ED Discharge Orders    None       Rodney Booze, PA-C 05/11/19 0756    Rodney Booze, PA-C 05/11/19 0804    Ward, Delice Bison, DO 05/11/19 2259

## 2019-05-10 NOTE — ED Triage Notes (Signed)
Pt bib ems from home with 4 seizures in the last hour. Has seizure hx. Pupils non reactive. 18gLAC. Seizure witnessed by ems lasted approx 30 seconds. Pt c/o headache, neck pain and can't see". Pt was able to sign [facility] with EMS without difficulty seeing. 150/97, HR 132, 98% RA. No incontinence or oral trauma noted.

## 2019-05-11 ENCOUNTER — Other Ambulatory Visit: Payer: Self-pay

## 2019-05-11 ENCOUNTER — Emergency Department (HOSPITAL_COMMUNITY): Payer: Medicaid Other

## 2019-05-11 ENCOUNTER — Encounter (HOSPITAL_COMMUNITY): Payer: Self-pay

## 2019-05-11 ENCOUNTER — Observation Stay (HOSPITAL_COMMUNITY): Payer: Medicaid Other

## 2019-05-11 DIAGNOSIS — F319 Bipolar disorder, unspecified: Secondary | ICD-10-CM | POA: Diagnosis present

## 2019-05-11 DIAGNOSIS — Z808 Family history of malignant neoplasm of other organs or systems: Secondary | ICD-10-CM | POA: Diagnosis not present

## 2019-05-11 DIAGNOSIS — I129 Hypertensive chronic kidney disease with stage 1 through stage 4 chronic kidney disease, or unspecified chronic kidney disease: Secondary | ICD-10-CM | POA: Diagnosis present

## 2019-05-11 DIAGNOSIS — Y929 Unspecified place or not applicable: Secondary | ICD-10-CM | POA: Diagnosis not present

## 2019-05-11 DIAGNOSIS — M1990 Unspecified osteoarthritis, unspecified site: Secondary | ICD-10-CM | POA: Diagnosis present

## 2019-05-11 DIAGNOSIS — M109 Gout, unspecified: Secondary | ICD-10-CM | POA: Diagnosis present

## 2019-05-11 DIAGNOSIS — Z8249 Family history of ischemic heart disease and other diseases of the circulatory system: Secondary | ICD-10-CM | POA: Diagnosis not present

## 2019-05-11 DIAGNOSIS — W19XXXA Unspecified fall, initial encounter: Secondary | ICD-10-CM | POA: Diagnosis present

## 2019-05-11 DIAGNOSIS — I252 Old myocardial infarction: Secondary | ICD-10-CM | POA: Diagnosis not present

## 2019-05-11 DIAGNOSIS — R569 Unspecified convulsions: Principal | ICD-10-CM

## 2019-05-11 DIAGNOSIS — E785 Hyperlipidemia, unspecified: Secondary | ICD-10-CM | POA: Diagnosis present

## 2019-05-11 DIAGNOSIS — E872 Acidosis: Secondary | ICD-10-CM | POA: Diagnosis present

## 2019-05-11 DIAGNOSIS — E119 Type 2 diabetes mellitus without complications: Secondary | ICD-10-CM | POA: Diagnosis not present

## 2019-05-11 DIAGNOSIS — Z86711 Personal history of pulmonary embolism: Secondary | ICD-10-CM

## 2019-05-11 DIAGNOSIS — Z87892 Personal history of anaphylaxis: Secondary | ICD-10-CM | POA: Diagnosis not present

## 2019-05-11 DIAGNOSIS — K219 Gastro-esophageal reflux disease without esophagitis: Secondary | ICD-10-CM | POA: Diagnosis present

## 2019-05-11 DIAGNOSIS — Z20828 Contact with and (suspected) exposure to other viral communicable diseases: Secondary | ICD-10-CM | POA: Diagnosis present

## 2019-05-11 DIAGNOSIS — R6510 Systemic inflammatory response syndrome (SIRS) of non-infectious origin without acute organ dysfunction: Secondary | ICD-10-CM | POA: Diagnosis present

## 2019-05-11 DIAGNOSIS — Z8673 Personal history of transient ischemic attack (TIA), and cerebral infarction without residual deficits: Secondary | ICD-10-CM | POA: Diagnosis not present

## 2019-05-11 DIAGNOSIS — I442 Atrioventricular block, complete: Secondary | ICD-10-CM | POA: Diagnosis present

## 2019-05-11 DIAGNOSIS — I2510 Atherosclerotic heart disease of native coronary artery without angina pectoris: Secondary | ICD-10-CM | POA: Diagnosis present

## 2019-05-11 DIAGNOSIS — E7800 Pure hypercholesterolemia, unspecified: Secondary | ICD-10-CM | POA: Diagnosis present

## 2019-05-11 DIAGNOSIS — G92 Toxic encephalopathy: Secondary | ICD-10-CM | POA: Diagnosis present

## 2019-05-11 DIAGNOSIS — Z91013 Allergy to seafood: Secondary | ICD-10-CM | POA: Diagnosis not present

## 2019-05-11 DIAGNOSIS — R52 Pain, unspecified: Secondary | ICD-10-CM | POA: Diagnosis present

## 2019-05-11 DIAGNOSIS — T426X5A Adverse effect of other antiepileptic and sedative-hypnotic drugs, initial encounter: Secondary | ICD-10-CM | POA: Diagnosis present

## 2019-05-11 DIAGNOSIS — Z981 Arthrodesis status: Secondary | ICD-10-CM | POA: Diagnosis not present

## 2019-05-11 DIAGNOSIS — N183 Chronic kidney disease, stage 3 (moderate): Secondary | ICD-10-CM | POA: Diagnosis present

## 2019-05-11 LAB — LACTIC ACID, PLASMA
Lactic Acid, Venous: 1.4 mmol/L (ref 0.5–1.9)
Lactic Acid, Venous: 1.7 mmol/L (ref 0.5–1.9)
Lactic Acid, Venous: 1.7 mmol/L (ref 0.5–1.9)
Lactic Acid, Venous: 3.6 mmol/L (ref 0.5–1.9)

## 2019-05-11 LAB — CBC WITH DIFFERENTIAL/PLATELET
Abs Immature Granulocytes: 0.01 10*3/uL (ref 0.00–0.07)
Abs Immature Granulocytes: 0.03 10*3/uL (ref 0.00–0.07)
Basophils Absolute: 0 10*3/uL (ref 0.0–0.1)
Basophils Absolute: 0 10*3/uL (ref 0.0–0.1)
Basophils Relative: 0 %
Basophils Relative: 0 %
Eosinophils Absolute: 0 10*3/uL (ref 0.0–0.5)
Eosinophils Absolute: 0.1 10*3/uL (ref 0.0–0.5)
Eosinophils Relative: 0 %
Eosinophils Relative: 1 %
HCT: 42.7 % (ref 39.0–52.0)
HCT: 46 % (ref 39.0–52.0)
Hemoglobin: 13.9 g/dL (ref 13.0–17.0)
Hemoglobin: 14.7 g/dL (ref 13.0–17.0)
Immature Granulocytes: 0 %
Immature Granulocytes: 0 %
Lymphocytes Relative: 19 %
Lymphocytes Relative: 28 %
Lymphs Abs: 1.6 10*3/uL (ref 0.7–4.0)
Lymphs Abs: 2.1 10*3/uL (ref 0.7–4.0)
MCH: 29.1 pg (ref 26.0–34.0)
MCH: 29.3 pg (ref 26.0–34.0)
MCHC: 32 g/dL (ref 30.0–36.0)
MCHC: 32.6 g/dL (ref 30.0–36.0)
MCV: 89.5 fL (ref 80.0–100.0)
MCV: 91.8 fL (ref 80.0–100.0)
Monocytes Absolute: 0.7 10*3/uL (ref 0.1–1.0)
Monocytes Absolute: 0.8 10*3/uL (ref 0.1–1.0)
Monocytes Relative: 10 %
Monocytes Relative: 9 %
Neutro Abs: 4.8 10*3/uL (ref 1.7–7.7)
Neutro Abs: 6.1 10*3/uL (ref 1.7–7.7)
Neutrophils Relative %: 63 %
Neutrophils Relative %: 70 %
Platelets: 213 10*3/uL (ref 150–400)
Platelets: 222 10*3/uL (ref 150–400)
RBC: 4.77 MIL/uL (ref 4.22–5.81)
RBC: 5.01 MIL/uL (ref 4.22–5.81)
RDW: 13.8 % (ref 11.5–15.5)
RDW: 13.9 % (ref 11.5–15.5)
WBC: 7.7 10*3/uL (ref 4.0–10.5)
WBC: 8.6 10*3/uL (ref 4.0–10.5)
nRBC: 0 % (ref 0.0–0.2)
nRBC: 0 % (ref 0.0–0.2)

## 2019-05-11 LAB — COMPREHENSIVE METABOLIC PANEL
ALT: 14 U/L (ref 0–44)
ALT: 15 U/L (ref 0–44)
AST: 18 U/L (ref 15–41)
AST: 21 U/L (ref 15–41)
Albumin: 3.2 g/dL — ABNORMAL LOW (ref 3.5–5.0)
Albumin: 3.5 g/dL (ref 3.5–5.0)
Alkaline Phosphatase: 45 U/L (ref 38–126)
Alkaline Phosphatase: 46 U/L (ref 38–126)
Anion gap: 11 (ref 5–15)
Anion gap: 9 (ref 5–15)
BUN: 11 mg/dL (ref 8–23)
BUN: 12 mg/dL (ref 8–23)
CO2: 19 mmol/L — ABNORMAL LOW (ref 22–32)
CO2: 21 mmol/L — ABNORMAL LOW (ref 22–32)
Calcium: 8.4 mg/dL — ABNORMAL LOW (ref 8.9–10.3)
Calcium: 9.1 mg/dL (ref 8.9–10.3)
Chloride: 106 mmol/L (ref 98–111)
Chloride: 109 mmol/L (ref 98–111)
Creatinine, Ser: 1.19 mg/dL (ref 0.61–1.24)
Creatinine, Ser: 1.25 mg/dL — ABNORMAL HIGH (ref 0.61–1.24)
GFR calc Af Amer: >60 mL/min (ref >60)
GFR calc Af Amer: >60 mL/min (ref >60)
GFR calc non Af Amer: >60 mL/min (ref >60)
GFR calc non Af Amer: >60 mL/min (ref >60)
Glucose, Bld: 121 mg/dL — ABNORMAL HIGH (ref 70–99)
Glucose, Bld: 143 mg/dL — ABNORMAL HIGH (ref 70–99)
Potassium: 3.9 mmol/L (ref 3.5–5.1)
Potassium: 3.9 mmol/L (ref 3.5–5.1)
Sodium: 136 mmol/L (ref 135–145)
Sodium: 139 mmol/L (ref 135–145)
Total Bilirubin: 0.3 mg/dL (ref 0.3–1.2)
Total Bilirubin: 0.5 mg/dL (ref 0.3–1.2)
Total Protein: 6.3 g/dL — ABNORMAL LOW (ref 6.5–8.1)
Total Protein: 6.6 g/dL (ref 6.5–8.1)

## 2019-05-11 LAB — SARS CORONAVIRUS 2 BY RT PCR (HOSPITAL ORDER, PERFORMED IN ~~LOC~~ HOSPITAL LAB): SARS Coronavirus 2: NEGATIVE

## 2019-05-11 LAB — CBG MONITORING, ED: Glucose-Capillary: 216 mg/dL — ABNORMAL HIGH (ref 70–99)

## 2019-05-11 LAB — GLUCOSE, CAPILLARY: Glucose-Capillary: 120 mg/dL — ABNORMAL HIGH (ref 70–99)

## 2019-05-11 LAB — VALPROIC ACID LEVEL: Valproic Acid Lvl: 36 ug/mL — ABNORMAL LOW (ref 50.0–100.0)

## 2019-05-11 LAB — PROTIME-INR
INR: 1.4 — ABNORMAL HIGH (ref 0.8–1.2)
Prothrombin Time: 16.6 seconds — ABNORMAL HIGH (ref 11.4–15.2)

## 2019-05-11 LAB — APTT: aPTT: 32 seconds (ref 24–36)

## 2019-05-11 LAB — ETHANOL: Alcohol, Ethyl (B): <10 mg/dL (ref <10)

## 2019-05-11 LAB — HIV ANTIBODY (ROUTINE TESTING W REFLEX): HIV Screen 4th Generation wRfx: NONREACTIVE

## 2019-05-11 MED ORDER — VANCOMYCIN HCL 10 G IV SOLR
1500.0000 mg | Freq: Once | INTRAVENOUS | Status: AC
  Administered 2019-05-11: 1500 mg via INTRAVENOUS
  Filled 2019-05-11: qty 1500

## 2019-05-11 MED ORDER — SODIUM CHLORIDE 0.9 % IV BOLUS
1000.0000 mL | Freq: Once | INTRAVENOUS | Status: AC
  Administered 2019-05-11: 01:00:00 1000 mL via INTRAVENOUS

## 2019-05-11 MED ORDER — ALLOPURINOL 300 MG PO TABS
300.0000 mg | ORAL_TABLET | Freq: Every day | ORAL | Status: DC
  Administered 2019-05-11 – 2019-05-15 (×5): 300 mg via ORAL
  Filled 2019-05-11 (×5): qty 1

## 2019-05-11 MED ORDER — WARFARIN - PHARMACIST DOSING INPATIENT
Freq: Every day | Status: DC
  Administered 2019-05-11: 17:00:00

## 2019-05-11 MED ORDER — SODIUM CHLORIDE 0.9 % IV SOLN
INTRAVENOUS | Status: DC
  Administered 2019-05-11 (×2): via INTRAVENOUS

## 2019-05-11 MED ORDER — NITROGLYCERIN 0.4 MG SL SUBL: 0.4000 mg | SUBLINGUAL_TABLET | SUBLINGUAL | Status: DC | PRN

## 2019-05-11 MED ORDER — SODIUM CHLORIDE 0.9 % IV SOLN: 2.0000 g | Freq: Three times a day (TID) | INTRAVENOUS | Status: DC

## 2019-05-11 MED ORDER — TRAZODONE HCL 50 MG PO TABS
100.0000 mg | ORAL_TABLET | Freq: Every evening | ORAL | Status: DC | PRN
  Administered 2019-05-12: 100 mg via ORAL
  Filled 2019-05-11: qty 2

## 2019-05-11 MED ORDER — VANCOMYCIN HCL IN DEXTROSE 1-5 GM/200ML-% IV SOLN: 1000.0000 mg | Freq: Once | INTRAVENOUS | Status: DC

## 2019-05-11 MED ORDER — VANCOMYCIN HCL 10 G IV SOLR
1250.0000 mg | Freq: Two times a day (BID) | INTRAVENOUS | Status: DC
  Filled 2019-05-11 (×2): qty 1250

## 2019-05-11 MED ORDER — ACETAMINOPHEN 650 MG RE SUPP: 650.0000 mg | Freq: Four times a day (QID) | RECTAL | Status: DC | PRN

## 2019-05-11 MED ORDER — VALPROATE SODIUM 500 MG/5ML IV SOLN
1750.0000 mg | Freq: Once | INTRAVENOUS | Status: AC
  Administered 2019-05-11: 1750 mg via INTRAVENOUS
  Filled 2019-05-11: qty 17.5

## 2019-05-11 MED ORDER — FOLIC ACID 1 MG PO TABS
1.0000 mg | ORAL_TABLET | Freq: Every day | ORAL | Status: DC
  Administered 2019-05-11 – 2019-05-15 (×5): 1 mg via ORAL
  Filled 2019-05-11 (×5): qty 1

## 2019-05-11 MED ORDER — ONDANSETRON HCL 4 MG/2ML IJ SOLN: 4.0000 mg | Freq: Four times a day (QID) | INTRAMUSCULAR | Status: DC | PRN

## 2019-05-11 MED ORDER — LOSARTAN POTASSIUM 50 MG PO TABS
100.0000 mg | ORAL_TABLET | Freq: Every day | ORAL | Status: DC
  Administered 2019-05-11 – 2019-05-15 (×5): 100 mg via ORAL
  Filled 2019-05-11 (×5): qty 2

## 2019-05-11 MED ORDER — ASPIRIN EC 81 MG PO TBEC
81.0000 mg | DELAYED_RELEASE_TABLET | Freq: Every day | ORAL | Status: DC
  Administered 2019-05-11 – 2019-05-15 (×5): 81 mg via ORAL
  Filled 2019-05-11 (×5): qty 1

## 2019-05-11 MED ORDER — AMLODIPINE BESYLATE 5 MG PO TABS
5.0000 mg | ORAL_TABLET | Freq: Every day | ORAL | Status: DC
  Administered 2019-05-11 – 2019-05-15 (×5): 5 mg via ORAL
  Filled 2019-05-11 (×5): qty 1

## 2019-05-11 MED ORDER — SODIUM CHLORIDE 0.9 % IV SOLN
2.0000 g | Freq: Once | INTRAVENOUS | Status: AC
  Administered 2019-05-11: 02:00:00 2 g via INTRAVENOUS
  Filled 2019-05-11: qty 2

## 2019-05-11 MED ORDER — LORAZEPAM 2 MG/ML IJ SOLN
INTRAMUSCULAR | Status: AC
  Administered 2019-05-11
  Filled 2019-05-11: qty 1

## 2019-05-11 MED ORDER — ACETAMINOPHEN 325 MG PO TABS
650.0000 mg | ORAL_TABLET | Freq: Four times a day (QID) | ORAL | Status: DC | PRN
  Administered 2019-05-11: 650 mg via ORAL
  Filled 2019-05-11: qty 2

## 2019-05-11 MED ORDER — GABAPENTIN 400 MG PO CAPS: 400.0000 mg | ORAL_CAPSULE | Freq: Two times a day (BID) | ORAL | Status: DC

## 2019-05-11 MED ORDER — METRONIDAZOLE IN NACL 5-0.79 MG/ML-% IV SOLN
500.0000 mg | Freq: Once | INTRAVENOUS | Status: AC
  Administered 2019-05-11: 500 mg via INTRAVENOUS
  Filled 2019-05-11: qty 100

## 2019-05-11 MED ORDER — ONDANSETRON HCL 4 MG PO TABS: 4.0000 mg | ORAL_TABLET | Freq: Four times a day (QID) | ORAL | Status: DC | PRN

## 2019-05-11 MED ORDER — DULOXETINE HCL 30 MG PO CPEP
30.0000 mg | ORAL_CAPSULE | Freq: Two times a day (BID) | ORAL | Status: DC
  Administered 2019-05-11 – 2019-05-15 (×9): 30 mg via ORAL
  Filled 2019-05-11 (×9): qty 1

## 2019-05-11 MED ORDER — COLLAGENASE 250 UNIT/GM EX OINT
TOPICAL_OINTMENT | Freq: Every day | CUTANEOUS | Status: DC
  Administered 2019-05-11 – 2019-05-15 (×4): via TOPICAL
  Filled 2019-05-11: qty 30

## 2019-05-11 MED ORDER — WARFARIN SODIUM 7.5 MG PO TABS
7.5000 mg | ORAL_TABLET | Freq: Once | ORAL | Status: AC
  Administered 2019-05-11: 7.5 mg via ORAL
  Filled 2019-05-11: qty 1

## 2019-05-11 MED ORDER — SODIUM CHLORIDE 0.9 % IV BOLUS (SEPSIS): 1000.0000 mL | Freq: Once | INTRAVENOUS | Status: DC

## 2019-05-11 MED ORDER — DIVALPROEX SODIUM 250 MG PO DR TAB
500.0000 mg | DELAYED_RELEASE_TABLET | Freq: Two times a day (BID) | ORAL | Status: DC
  Administered 2019-05-11 – 2019-05-12 (×3): 500 mg via ORAL
  Filled 2019-05-11 (×3): qty 2

## 2019-05-11 MED ORDER — OXYCODONE-ACETAMINOPHEN 7.5-325 MG PO TABS
1.0000 | ORAL_TABLET | Freq: Three times a day (TID) | ORAL | Status: DC | PRN
  Administered 2019-05-11 – 2019-05-15 (×11): 1 via ORAL
  Filled 2019-05-11 (×11): qty 1

## 2019-05-11 MED ORDER — ROSUVASTATIN CALCIUM 20 MG PO TABS
40.0000 mg | ORAL_TABLET | Freq: Every day | ORAL | Status: DC
  Administered 2019-05-11 – 2019-05-15 (×5): 40 mg via ORAL
  Filled 2019-05-11 (×5): qty 2

## 2019-05-11 NOTE — Procedures (Signed)
History: 63 year old male with a history of seizures being evaluated for breakthrough seizures  Sedation: None  Technique: This is a 21 channel routine scalp EEG performed at the bedside with bipolar and monopolar montages arranged in accordance to the international 10/20 system of electrode placement. One channel was dedicated to EKG recording.    Background: The majority of this EEG was performed during drowsiness and light sleep.  During periods of maximal wakefulness, however, the background is relatively normal-appearing with a posterior dominant rhythm of 9 to 10 Hz  Photic stimulation: Physiologic driving is not performed  EEG Abnormalities: None  Clinical Interpretation: This normal EEG is recorded in the waking and sleep state. There was no seizure or seizure predisposition recorded on this study. Please note that lack of epileptiform activity on EEG does not preclude the possibility of epilepsy.   Roland Rack, MD [facility] [phone]  If 7pm- 7am, please page neurology on call as listed in [place].

## 2019-05-11 NOTE — ED Provider Notes (Signed)
Medical screening examination/treatment/procedure(s) were conducted as a shared visit with non-physician practitioner(s) and myself.  I personally evaluated the patient during the encounter.  EKG Interpretation  Date/Time:  Sunday May 10 2019 22:26:09 EDT Ventricular Rate:  109 PR Interval:    QRS Duration: 86 QT Interval:  323 QTC Calculation: 435 R Axis:   45 Text Interpretation:  Sinus tachycardia Probable left atrial enlargement Rate faster than previous Confirmed by Pryor Curia [phone]) on 05/10/2019 11:09:20 PM   Patient is a 63 year old male with history of seizures on Depakote 500 mg twice daily who presents to the emergency department with multiple seizures at home and multiple seizures with EMS and here in the ED.  Patient has generalized tonic-clonic seizures but no significant postictal period afterwards.  He also complains of 3 days of left-sided numbness in his face, arm and leg that is new for him.  He does have some chronic weakness on that side.  Has had multiple MRIs to rule out stroke for similar symptoms.  Recently also had a negative EEG.  Patient's Depakote level here is subtherapeutic.  He has received IV Keppra.  Neurology has been consulted and will also give IV Depakote.  He has received 2 rounds of 2 mg IV Ativan for seizures in the ED.  Head CT shows no acute abnormality.  Patient also found to have low-grade temperature of 100.1 here with complaints of cough and diarrhea.  Lactate elevated which could be from sepsis versus seizures.  He is receiving IV fluids and broad-spectrum antibiotics.  COVID test is negative.  Chest x-ray clear.  No sign of UTI.   Cultures pending.  Doubt meningitis.  He does complain of headache from hitting his head during 1 of seizures.  He is on Coumadin but his INR subtherapeutic.  He has chronic neck pain that is unchanged.  Discussed with medicine for admission.   Colsen Modi, Delice Bison, DO 05/11/19 [phone]

## 2019-05-11 NOTE — Progress Notes (Signed)
[place] [[id number]] LOC:   Progressive [102]  Comments:   Dx: Seizure [facility]) Admitting:  Attending: Dessa Phi, DO   Patient admitted to room [phone] at [phone] from ED via stretcher, accompanied by RN ED.  Patient awake and oriented x1 with eyes closed.  Patient rousable, but refused to answer questions.  Patient able to make needs known to staff.  Respirations free and easy.  No obvious s/s of distress noted.  Patient refused to answer questions regarding pain or discomfort.  No guarding, withdrawing or crying noted. V/S stable - 97.1, 138/89, 83, 21.   Patient removed IV site from left arm. Skin is dry, area of breakdown noted on right shoulder.  MD has been notified, see orders.  Patient had episode of seizure-like activity at 0358 lasting 1 minute and 20 seconds.  Seizuer pads on bed, suction in place by bedside.   Patient observed wiggling in bed, symmetrically in all extremities.  No loss of bladder of bowel control noted.  Resistance noted when staff attempted to move limbs.  This nurse and charge nurse at patient bedside to witness event.  Patient assessed immediately after event, patient able to answer his name and verbalized "I had a seizure" then requested clothes, a blanket and that he was cold.  Mouth examined, no bite marks noted on tongue or lips.  Oral mucosa intact. Dr.Karkandy aware of situation and en route to patient's room.  Unable to orient patient to room, patient refused to acknowledge nurse's presence and remained in bed on side with eyes tightly closed.  Patient able to speak when he chooses, for example patient able to ask for food, blankets ambulate. Patient oriented to room and unit.  Patient refused to speak to nurse, but demonstrated understanding of instructions.

## 2019-05-11 NOTE — ED Notes (Signed)
Daughter- [phone] (couldn't understand her name, maybe tamika?)

## 2019-05-11 NOTE — Progress Notes (Addendum)
  PROGRESS NOTE  Patient admitted earlier this morning. See H&P. Russell Carter is a 63 y.o. male with history of seizure, hypertension, pulmonary embolism on Coumadin, polysubstance abuse, chronic kidney disease stage II, nonobstructive CAD, chronic pain was brought to the ER after patient was found to have 4 witnessed seizures back-to-back lasting for less than 2 minutes within a 1 hour.  Patient has stated to ER physician that he has been feeling dizzy and weak last few days had some diarrhea. In the ER, patient had 2 more seizures lasting for less than 2 minutes one was lasting half a minute.  Patient was given Ativan followed by Keppra IV 1 g.  Following which Depakote levels came at around 36 for which patient was loaded with Depakote.  Neurology was consulted.  CT head CT C-spine L-spine T-spine were unremarkable.   Patient seen and examined this morning. He remains lethargic, alert to voice but does not attempt to answer any questions or follow commands. Appears comfortable otherwise.   Neurology consulted EEG pending Loaded with IV keppra. Continue depakote ?Fever, no source of infection found. CXR negative. UA unremarkable. Blood cultures pending. No leukocytosis, afebrile, lactic acidosis resolved. Stop antibiotics and watch fever curve  Blood culture, urine culture pending  Coumadin per pharmacy, INR 1.4    Dessa Phi, DO [facility] [email or web address] 05/11/2019, 9:37 AM

## 2019-05-11 NOTE — Consult Note (Signed)
[facility] Nurse wound consult note Reason for Consult: Wound right shoulder. Patient is not talking to me (post EEG) to determine etiology.  Wound has 50% devitalized tissue.  Will begin debridement.  Wound type:unclear.  Listed as burn in flowsheet.  Pressure Injury POA: NA Measurement: 3 cmx 2.5 cm unable to visualize wound bed.  Wound bed:50% gray slough and 50 % pale pink  Drainage (amount, consistency, odor) Minimal serosanguinous  No odor.  Periwound:intact Dressing procedure/placement/frequency:  Cleanse shoulder wound with NS and pat dry.  Apply Santyl to wound bed.  Cover with NS moist gauze.  Cover with dry dressing and secure with foam dressing. Change santyl and gauze daily.  Change foam dressing every three days.   Will not follow at this time.  Please re-consult if needed.  Domenic Moras MSN, RN, FNP-BC CWON Wound, Ostomy, Continence Nurse Pager [phone]

## 2019-05-11 NOTE — Progress Notes (Signed)
Pharmacy Antibiotic Note  Pius Tuel is a 63 y.o. male admitted on 05/10/2019 with seizures and fever, poss sepsis.  Pharmacy has been consulted for Vancomycin and Cefepime  Dosing.  Vancomycin 1500 mg IV given in ED at  [place]: Vancomycin 1250 mg IV q12h Cefepime 2 g IV q8h  Height: 5\' 11"  (180.3 cm) Weight: 203 lb (92.1 kg) IBW/kg (Calculated) : 75.3  Temp (24hrs), Avg:98.9 F (37.2 C), Min:97.1 F (36.2 C), Max:100.1 F (37.8 C)  Recent Labs  Lab 05/10/19 2345 05/11/19 0307 05/11/19 0600 05/11/19 0615  WBC 8.6  --  7.7  --   CREATININE 1.25*  --  1.19  --   LATICACIDVEN 3.6* 1.7  --  1.7    Estimated Creatinine Clearance: 72.7 mL/min (by C-G formula based on SCr of 1.19 mg/dL).    Allergies  Allergen Reactions  . Shellfish Allergy Anaphylaxis    Caryl Pina 05/11/2019 7:37 AM

## 2019-05-11 NOTE — Progress Notes (Addendum)
1332: Call to [place]. Updated on patient condition.   1910: Page to MD. Patient having seizure like activity. VS remained stable. Lasted approx 25sec.

## 2019-05-11 NOTE — Progress Notes (Signed)
EEG complete - results pending 

## 2019-05-11 NOTE — ED Notes (Signed)
RN starting new IV and will collect labs.

## 2019-05-11 NOTE — Progress Notes (Addendum)
Reason for consult:   Subjective: The patient remains somnolent today on exam. He denied pain, stating that he feels "groogy" but overall "alright". He endorses no other concerns.   ROS: negative except above  Examination  Vital signs in last 24 hours: Temp:  [97.1 F (36.2 C)-100.1 F (37.8 C)] 97.1 F (36.2 C) (07/20 0451) Pulse Rate:  [83-123] 83 (07/20 0451) Resp:  [14-32] 21 (07/20 0451) BP: (128-159)/(80-97) 128/84 (07/20 0828) SpO2:  [96 %-97 %] 96 % (07/20 0451) Weight:  [92.1 kg] 92.1 kg (07/19 2229)  General: lying in bed today CVS: pulse-normal rate and rhythm RS: breathing comfortably, lungs CTA bilaterally  Extremities: normal   Neuro:  MS: Somnolent but oriented x3, follows commands but drifts off during evaluation  CN: pupils equal and reactive,  EOMI grossly, face symmetric, tongue midline, patient endorses persistent decreased sensation to the left side of his face, Motor: Roughly 5/5 strength in all 4 extremities  Reflexes: 2+ bilaterally over patella Coordination: Not tested due to somnolence  Gait: not tested  Basic Metabolic Panel: Recent Labs  Lab 05/10/19 2345 05/11/19 0600  NA 136 139  K 3.9 3.9  CL 106 109  CO2 19* 21*  GLUCOSE 143* 121*  BUN 11 12  CREATININE 1.25* 1.19  CALCIUM 9.1 8.4*   CBC: Recent Labs  Lab 05/10/19 2345 05/11/19 0600  WBC 8.6 7.7  NEUTROABS 6.1 4.8  HGB 14.7 13.9  HCT 46.0 42.7  MCV 91.8 89.5  PLT 222 213   Coagulation Studies: Recent Labs    05/10/19 2345  LABPROT 16.6*  INR 1.4*   Imaging Reviewed:   ASSESSMENT AND PLAN  Assessment: Russell Carter is a 63yo M w/ a PMHx notable for a history of seizure, HTN, PE on coumadin, polysubstance use disorder, CKD II, nonobstructive CAD, chronic pain syndrome who was brought to the ER after he was noted to have experienced 4 seizures back-to-back without full resolution between. He informed the ED that he was feeling unwell prior and had diarrhea before  again seizing in the ED for which he was given Ativan and Keppra. CT head, C-T-L spine were all unremarkable.   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 Patient endorsed being off of his home valproic acid regimen.  The patient continues to improve today with increased responsiveness to command per nursing as well as decreased somnolence absent recurrent seizures.   Plan: Continue Depakote 500mg  BID but will consider alternative medication due to wide fluctuation in levels pending discussion with patient No more Keppra EEG is normal  Check a depakote level in 2-3 days if continued   Kathi Ludwig, MD [facility] Internal Medicine, PGY-3 Pager # [phone]  NEUROHOSPITALIST ADDENDUM Performed a face to face diagnostic evaluation.   I have reviewed the contents of history and physical exam as documented by PA/ARNP/Resident and agree with above documentation.  I have discussed and formulated the above plan as documented. Edits to the note have been made as needed.   The patient states that he is compliant on Depakote.  However has been subtherapeutic in the past raising concern for drug interactions.  May consider switching him to alternate AED and will discuss with his neurologist Dr. Jannifer Franklin.   Karena Addison Aroor MD [facility] [phone]   If 7pm to 7am, please call on call as listed on AMION. Karena Addison Aroor [facility] Pager Number [phone] For questions after 7pm please refer to AMION to reach the Neurologist on call

## 2019-05-11 NOTE — ED Notes (Signed)
ED TO INPATIENT HANDOFF REPORT  ED Nurse Name and Phone #:  [phone]  S Name/Age/Gender Russell Carter 63 y.o. male Room/Bed: 021C/021C  Code Status   Code Status: Prior  Home/SNF/Other Home Patient oriented to: self, place, time and situation Is this baseline? Yes   Triage Complete: Triage complete  Chief Complaint seizure  Triage Note Pt bib ems from home with 4 seizures in the last hour. Has seizure hx. Pupils non reactive. 18gLAC. Seizure witnessed by ems lasted approx 30 seconds. Pt c/o headache, neck pain and can't see". Pt was able to sign [facility] with EMS without difficulty seeing. 150/97, HR 132, 98% RA. No incontinence or oral trauma noted.    Allergies Allergies  Allergen Reactions  . Shellfish Allergy Anaphylaxis    Level of Care/Admitting Diagnosis ED Disposition    ED Disposition Condition Comment   Admit  Hospital Area: [place] [[id number]]  Level of Care: Progressive [102]  I expect the patient will be discharged within 24 hours: No (not a candidate for 5C-Observation unit)  Covid Evaluation: N/A  Diagnosis: Seizure ([place]) [010932]  Admitting Physician: Rise Patience [phone]  Attending Physician: Rise Patience Lei.Right  PT Class (Do Not Modify): Observation [104]  PT Acc Code (Do Not Modify): Observation [10022]       B Medical/Surgery History Past Medical History:  Diagnosis Date  . Acute kidney injury ([place]) 04/27/2018  . AKI (acute kidney injury) ([place])   . Anginal pain ([place])   . Anxiety   . Arthritis   . Bipolar disorder ([place])   . Blood in stool   . CAD (coronary artery disease)    a. 2011: cath showing mod non-obstructive disease b. 02/2016: cath with 95% stenosis in the Mid RCA. DES placed.  . Cervical stenosis of spine   . Chronic anticoagulation 04/27/2018  . Chronic pain disorder 06/05/2017  . CKD (chronic kidney disease), stage III ([place]) 05/06/2018  . Cocaine abuse ([place])    history  . Complete heart block  ([place]) 03/14/2016  . Depression   . Diabetes mellitus type 2 in nonobese (HCC)   . Diabetes mellitus without complication ([place])   . Dyslipidemia 03/15/2016  . Essential hypertension 05/18/2009   Qualifier: Diagnosis of  By: Burnett Kanaris    . Gastroesophageal reflux disease without esophagitis 07/31/2017  . Gout, unspecified   . Headache   . History of CVA (cerebrovascular accident)   . History of pulmonary embolus (PE) 02/14/2017  . HTN (hypertension)   . Hypercholesterolemia   . Hyperglycemia   . Leukopenia 05/06/2018  . Migraines   . Myocardial infarction [facility]) 2008   drug cocaine and marijuana at time of mi none  since    . Normocytic anemia 05/06/2018  . Pneumonia   . Polysubstance abuse ([place]) 01/15/2013  . Rectal bleeding   . Renal insufficiency   . Seizure ([place]) 05/06/2018  . Seizures ([place])   . Stroke [facility])    Past Surgical History:  Procedure Laterality Date  . ANTERIOR CERVICAL DECOMP/DISCECTOMY FUSION N/A 01/24/2017   Procedure: Cervical Four-Five,Cervical Five Six,Cervical Six-Seven Anterior cervical discectomy with fusion and plate with Cervical Three-Thoracic- One Laminectomy/Foraminotomy with Cervical Three-Thoracic- One Fixation and fusion;  Surgeon: Kevan Ny Ditty, MD;  Location: [place];  Service: Neurosurgery;  Laterality: N/A;  Part-1 anterior approach  . CARDIAC CATHETERIZATION  2008; ~ 2011   [place]-40%lad  . CARDIAC CATHETERIZATION N/A 03/16/2016   Procedure: Left Heart Cath and Coronary Angiography;  Surgeon: Shanon Brow  Starr Lake, MD;  Location: [facility] INVASIVE CV LAB;  Service: Cardiovascular;  Laterality: N/A;  . COLONOSCOPY WITH PROPOFOL N/A 04/17/2018   Procedure: COLONOSCOPY WITH PROPOFOL;  Surgeon: Graylin Shiver, MD;  Location: [facility] ENDOSCOPY;  Service: Endoscopy;  Laterality: N/A;  . CORONARY ANGIOPLASTY     STENTS  . EYE SURGERY Bilateral    "for double vision"  . INGUINAL HERNIA REPAIR  05/12/2012   Procedure: HERNIA REPAIR INGUINAL ADULT;  Surgeon: Liz Malady, MD;  Location: [facility];  Service: General;  Laterality: Right;  . INGUINAL HERNIA REPAIR Left 02/02/2003   Dr. Violeta Gelinas. repair with mesh. Same procedure done again in  05/22/2004   . INGUINAL HERNIA REPAIR Left ?date 2nd OR  . LEFT HEART CATH AND CORONARY ANGIOGRAPHY N/A 04/29/2018   Procedure: LEFT HEART CATH AND CORONARY ANGIOGRAPHY;  Surgeon: Swaziland, Peter M, MD;  Location: [facility] INVASIVE CV LAB;  Service: Cardiovascular;  Laterality: N/A;  . POSTERIOR CERVICAL FUSION/FORAMINOTOMY N/A 01/24/2017   Procedure: Cervical Three-Thoracic One-Laminectomy/Foraminotomy with Cervical Three-Thoracic One Fixation and fusion;  Surgeon: Loura Halt Ditty, MD;  Location: [facility] OR;  Service: Neurosurgery;  Laterality: N/A;  Part-2 Posterior approach  . RECONSTRUCT / STABILIZE DISTAL ULNA    . SHOULDER OPEN ROTATOR CUFF REPAIR Left 2010  . SHOULDER SURGERY Left    "injured on the job; had to cut small bone out"  . THUMB FUSION Right      A IV Location/Drains/Wounds Patient Lines/Drains/Airways Status   Active Line/Drains/Airways    Name:   Placement date:   Placement time:   Site:   Days:   Peripheral IV 05/11/19 Right Forearm   05/11/19    0021    Forearm   less than 1   Peripheral IV 05/11/19 Left Forearm   05/11/19    0143    Forearm   less than 1          Intake/Output Last 24 hours No intake or output data in the 24 hours ending 05/11/19 0228  Labs/Imaging Results for orders placed or performed during the hospital encounter of 05/10/19 (from the past 48 hour(s))  Rapid urine drug screen (hospital performed)     Status: None   Collection Time: 05/10/19 10:50 PM  Result Value Ref Range   Opiates NONE DETECTED NONE DETECTED   Cocaine NONE DETECTED NONE DETECTED   Benzodiazepines NONE DETECTED NONE DETECTED   Amphetamines NONE DETECTED NONE DETECTED   Tetrahydrocannabinol NONE DETECTED NONE DETECTED   Barbiturates NONE DETECTED NONE DETECTED    Comment:  (NOTE) DRUG SCREEN FOR MEDICAL PURPOSES ONLY.  IF CONFIRMATION IS NEEDED FOR ANY PURPOSE, NOTIFY LAB WITHIN 5 DAYS. LOWEST DETECTABLE LIMITS FOR URINE DRUG SCREEN Drug Class                     Cutoff (ng/mL) Amphetamine and metabolites    1000 Barbiturate and metabolites    200 Benzodiazepine                 200 Tricyclics and metabolites     300 Opiates and metabolites        300 Cocaine and metabolites        300 THC                            50 Performed at [facility] Lab, [address], [place], [place] [address]   Urinalysis,  Routine w reflex microscopic     Status: Abnormal   Collection Time: 05/10/19 10:50 PM  Result Value Ref Range   Color, Urine YELLOW YELLOW   APPearance CLEAR CLEAR   Specific Gravity, Urine 1.021 1.005 - 1.030   pH 5.0 5.0 - 8.0   Glucose, UA NEGATIVE NEGATIVE mg/dL   Hgb urine dipstick NEGATIVE NEGATIVE   Bilirubin Urine NEGATIVE NEGATIVE   Ketones, ur NEGATIVE NEGATIVE mg/dL   Protein, ur 30 (A) NEGATIVE mg/dL   Nitrite NEGATIVE NEGATIVE   Leukocytes,Ua NEGATIVE NEGATIVE   RBC / HPF 0-5 0 - 5 RBC/hpf   WBC, UA 0-5 0 - 5 WBC/hpf   Bacteria, UA RARE (A) NONE SEEN   Squamous Epithelial / LPF 0-5 0 - 5   Mucus PRESENT    Sperm, UA PRESENT     Comment: Performed at [facility] Lab, [address], [place], [place] [address]  CBC with Differential     Status: None   Collection Time: 05/10/19 11:45 PM  Result Value Ref Range   WBC 8.6 4.0 - 10.5 K/uL   RBC 5.01 4.22 - 5.81 MIL/uL   Hemoglobin 14.7 13.0 - 17.0 g/dL   HCT 97.4 16.3 - 84.5 %   MCV 91.8 80.0 - 100.0 fL   MCH 29.3 26.0 - 34.0 pg   MCHC 32.0 30.0 - 36.0 g/dL   RDW 36.4 68.0 - 32.1 %   Platelets 222 150 - 400 K/uL   nRBC 0.0 0.0 - 0.2 %   Neutrophils Relative % 70 %   Neutro Abs 6.1 1.7 - 7.7 K/uL   Lymphocytes Relative 19 %   Lymphs Abs 1.6 0.7 - 4.0 K/uL   Monocytes Relative 10 %   Monocytes Absolute 0.8 0.1 - 1.0 K/uL   Eosinophils Relative 1 %   Eosinophils  Absolute 0.1 0.0 - 0.5 K/uL   Basophils Relative 0 %   Basophils Absolute 0.0 0.0 - 0.1 K/uL   Immature Granulocytes 0 %   Abs Immature Granulocytes 0.03 0.00 - 0.07 K/uL    Comment: Performed at [facility] Lab, [address], [place], [place] [address]  Comprehensive metabolic panel     Status: Abnormal   Collection Time: 05/10/19 11:45 PM  Result Value Ref Range   Sodium 136 135 - 145 mmol/L   Potassium 3.9 3.5 - 5.1 mmol/L   Chloride 106 98 - 111 mmol/L   CO2 19 (L) 22 - 32 mmol/L   Glucose, Bld 143 (H) 70 - 99 mg/dL   BUN 11 8 - 23 mg/dL   Creatinine, Ser 5.00 (H) 0.61 - 1.24 mg/dL   Calcium 9.1 8.9 - 37.0 mg/dL   Total Protein 6.6 6.5 - 8.1 g/dL   Albumin 3.5 3.5 - 5.0 g/dL   AST 21 15 - 41 U/L   ALT 15 0 - 44 U/L   Alkaline Phosphatase 46 38 - 126 U/L   Total Bilirubin 0.3 0.3 - 1.2 mg/dL   GFR calc non Af Amer >60 >60 mL/min   GFR calc Af Amer >60 >60 mL/min   Anion gap 11 5 - 15    Comment: Performed at [facility] Lab, [address] [place], [place] [address]  Protime-INR     Status: Abnormal   Collection Time: 05/10/19 11:45 PM  Result Value Ref Range   Prothrombin Time 16.6 (H) 11.4 - 15.2 seconds   INR 1.4 (H) 0.8 - 1.2    Comment: (NOTE)  INR goal varies based on device and disease states. Performed at [facility], [address] [place], [place] [address]   Ethanol     Status: None   Collection Time: 05/10/19 11:45 PM  Result Value Ref Range   Alcohol, Ethyl (B) <10 <10 mg/dL    Comment: (NOTE) Lowest detectable limit for serum alcohol is 10 mg/dL. For medical purposes only. Performed at [facility], [address] [place], [place] [address]   Lactic acid, plasma     Status: Abnormal   Collection Time: 05/10/19 11:45 PM  Result Value Ref Range   Lactic Acid, Venous 3.6 (HH) 0.5 - 1.9 mmol/L    Comment: CRITICAL RESULT CALLED TO, READ BACK BY AND VERIFIED WITH: Rosell Khouri M,RN 05/11/19 0047 WAYK Performed at [facility]  Lab, [address] [place], [place] [address]   Valproic acid level     Status: Abnormal   Collection Time: 05/10/19 11:45 PM  Result Value Ref Range   Valproic Acid Lvl 36 (L) 50.0 - 100.0 ug/mL    Comment: Performed at [facility] Lab, [address] [place], [place] [address]  APTT     Status: None   Collection Time: 05/10/19 11:45 PM  Result Value Ref Range   aPTT 32 24 - 36 seconds    Comment: Performed at [facility], [address] [place], [place] [address]  POC CBG, ED     Status: Abnormal   Collection Time: 05/10/19 11:58 PM  Result Value Ref Range   Glucose-Capillary 216 (H) 70 - 99 mg/dL  SARS Coronavirus 2 (CEPHEID - Performed in [facility] hospital lab), Hosp Order     Status: None   Collection Time: 05/11/19 12:05 AM   Specimen: Nasopharyngeal Swab  Result Value Ref Range   SARS Coronavirus 2 NEGATIVE NEGATIVE    Comment: (NOTE) If result is NEGATIVE SARS-CoV-2 target nucleic acids are NOT DETECTED. The SARS-CoV-2 RNA is generally detectable in upper and lower  respiratory specimens during the acute phase of infection. The lowest  concentration of SARS-CoV-2 viral copies this assay can detect is 250  copies / mL. A negative result does not preclude SARS-CoV-2 infection  and should not be used as the sole basis for treatment or other  patient management decisions.  A negative result may occur with  improper specimen collection / handling, submission of specimen other  than nasopharyngeal swab, presence of viral mutation(s) within the  areas targeted by this assay, and inadequate number of viral copies  (<250 copies / mL). A negative result must be combined with clinical  observations, patient history, and epidemiological information. If result is POSITIVE SARS-CoV-2 target nucleic acids are DETECTED. The SARS-CoV-2 RNA is generally detectable in upper and lower  respiratory specimens dur ing the acute phase of infection.  Positive  results are  indicative of active infection with SARS-CoV-2.  Clinical  correlation with patient history and other diagnostic information is  necessary to determine patient infection status.  Positive results do  not rule out bacterial infection or co-infection with other viruses. If result is PRESUMPTIVE POSTIVE SARS-CoV-2 nucleic acids MAY BE PRESENT.   A presumptive positive result was obtained on the submitted specimen  and confirmed on repeat testing.  While 2019 novel coronavirus  (SARS-CoV-2) nucleic acids may be present in the submitted sample  additional confirmatory testing may be necessary for epidemiological  and / or clinical management purposes  to differentiate between  SARS-CoV-2 and other Sarbecovirus currently known to infect humans.  If clinically indicated additional testing with an alternate test  methodology [phone]) is advised. The SARS-CoV-2 RNA is generally  detectable in upper and lower respiratory sp ecimens during the acute  phase of infection. The expected result is Negative. Fact Sheet for Patients:  BoilerBrush.com.cy Fact Sheet for Healthcare Providers: https://pope.com/ This test is not yet approved or cleared by the [place] FDA and has been authorized for detection and/or diagnosis of SARS-CoV-2 by FDA under an Emergency Use Authorization (EUA).  This EUA will remain in effect (meaning this test can be used) for the duration of the COVID-19 declaration under Section 564(b)(1) of the Act, 21 U.S.C. section 360bbb-3(b)(1), unless the authorization is terminated or revoked sooner. Performed at [facility] Lab, [address] [place], [place] [address]    Dg Thoracic Spine 2 View  Result Date: 05/11/2019 CLINICAL DATA:  Back pain EXAM: THORACIC SPINE 2 VIEWS COMPARISON:  None. FINDINGS: There is no evidence of thoracic spine fracture. Alignment is normal. No other significant bone abnormalities are  identified. IMPRESSION: Negative. Electronically Signed   By: Deatra Robinson M.D.   On: 05/11/2019 01:11   Dg Lumbar Spine Complete  Result Date: 05/11/2019 CLINICAL DATA:  63 year old male with back pain. EXAM: LUMBAR SPINE - COMPLETE 4+ VIEW COMPARISON:  Abdominal radiograph dated 05/06/2018 and CT dated 04/30/2018 FINDINGS: There is no acute fracture or subluxation of the lumbar spine. The vertebral body heights and disc spaces are maintained. Multilevel facet arthropathy. Grade 1 L3-L4 retrolisthesis. The soft tissues are unremarkable. IMPRESSION: 1. No acute/traumatic lumbar spine pathology. 2. Degenerative changes. Electronically Signed   By: Elgie Collard M.D.   On: 05/11/2019 01:12   Ct Head Wo Contrast  Result Date: 05/11/2019 CLINICAL DATA:  Headache, post traumatic; C-spine fx, traumatic headache and neck pain. Seizure. EXAM: CT HEAD WITHOUT CONTRAST CT CERVICAL SPINE WITHOUT CONTRAST TECHNIQUE: Multidetector CT imaging of the head and cervical spine was performed following the standard protocol without intravenous contrast. Multiplanar CT image reconstructions of the cervical spine were also generated. COMPARISON:  Multiple prior exams most recently head and cervical spine CT 04/13/2019 FINDINGS: CT HEAD FINDINGS Brain: No intracranial hemorrhage, mass effect, or midline shift. Age related atrophy. Similar chronic small vessel ischemia to prior. No hydrocephalus. The basilar cisterns are patent. No evidence of territorial infarct or acute ischemia. No extra-axial or intracranial fluid collection. Vascular: No hyperdense vessel. Skull: No fracture or focal lesion. Sinuses/Orbits: Lewers mucosal thickening of ethmoid air cells. No sinus fluid levels. Mastoid air cells are clear. Orbits are unremarkable. Other: None. CT CERVICAL SPINE FINDINGS Alignment: Postsurgical straightening. No traumatic subluxation. Skull base and vertebrae: No acute fracture. Dens and skull base are intact. Posterior C3  through T1 fusion and laminectomy. Anterior C4 through C7 fusion. No acute fracture. The dens and skull base are intact. Streak artifact from surgical hardware. Soft tissues and spinal canal: No prevertebral soft tissue edema. Canal partially obscured by streak artifact. Disc levels: Anterior fusion with discectomy C4-C5 through C6-C7. Mild endplate spurring W1-X9. Upper chest: No acute findings. Other: None. IMPRESSION: 1. No acute intracranial abnormality. No skull fracture. Mild chronic small vessel ischemia is unchanged from prior. 2. Postsurgical change in the cervical spine without acute fracture or subluxation. Electronically Signed   By: Narda Rutherford M.D.   On: 05/11/2019 01:20   Ct Cervical Spine Wo Contrast  Result Date: 05/11/2019 CLINICAL DATA:  Headache, post traumatic; C-spine fx,  traumatic headache and neck pain. Seizure. EXAM: CT HEAD WITHOUT CONTRAST CT CERVICAL SPINE WITHOUT CONTRAST TECHNIQUE: Multidetector CT imaging of the head and cervical spine was performed following the standard protocol without intravenous contrast. Multiplanar CT image reconstructions of the cervical spine were also generated. COMPARISON:  Multiple prior exams most recently head and cervical spine CT 04/13/2019 FINDINGS: CT HEAD FINDINGS Brain: No intracranial hemorrhage, mass effect, or midline shift. Age related atrophy. Similar chronic small vessel ischemia to prior. No hydrocephalus. The basilar cisterns are patent. No evidence of territorial infarct or acute ischemia. No extra-axial or intracranial fluid collection. Vascular: No hyperdense vessel. Skull: No fracture or focal lesion. Sinuses/Orbits: [place] mucosal thickening of ethmoid air cells. No sinus fluid levels. Mastoid air cells are clear. Orbits are unremarkable. Other: None. CT CERVICAL SPINE FINDINGS Alignment: Postsurgical straightening. No traumatic subluxation. Skull base and vertebrae: No acute fracture. Dens and skull base are intact. Posterior  C3 through T1 fusion and laminectomy. Anterior C4 through C7 fusion. No acute fracture. The dens and skull base are intact. Streak artifact from surgical hardware. Soft tissues and spinal canal: No prevertebral soft tissue edema. Canal partially obscured by streak artifact. Disc levels: Anterior fusion with discectomy C4-C5 through C6-C7. Mild endplate spurring S0-F0C3-C4. Upper chest: No acute findings. Other: None. IMPRESSION: 1. No acute intracranial abnormality. No skull fracture. Mild chronic small vessel ischemia is unchanged from prior. 2. Postsurgical change in the cervical spine without acute fracture or subluxation. Electronically Signed   By: Narda RutherfordMelanie  Sanford M.D.   On: 05/11/2019 01:20   Dg Chest Portable 1 View  Result Date: 05/11/2019 CLINICAL DATA:  63 year old male with cough EXAM: PORTABLE CHEST 1 VIEW COMPARISON:  Chest radiograph dated 04/09/2019 FINDINGS: Shallow inspiration. There is mild eventration of the left hemidiaphragm with associated subsegmental atelectasis. No focal consolidation, pleural effusion, or pneumothorax. The cardiac silhouette is within normal limits. No acute osseous pathology. Extensive cervical spine fixation hardware. IMPRESSION: No active disease. Electronically Signed   By: Elgie CollardArash  Radparvar M.D.   On: 05/11/2019 00:29    Pending Labs Unresulted Labs (From admission, onward)    Start     Ordered   05/11/19 0056  Urine culture  ONCE - STAT,   STAT     05/11/19 0056   05/10/19 2325  Lactic acid, plasma  Now then every 2 hours,   STAT     05/10/19 2325   05/10/19 2300  Blood culture (routine x 2)  BLOOD CULTURE X 2,   STAT     05/10/19 2259          Vitals/Pain Today's Vitals   05/11/19 0000 05/11/19 0115 05/11/19 0130 05/11/19 0142  BP: (!) 145/89  (!) 159/97   Pulse:  (!) 116    Resp: (!) 25 19    Temp:      TempSrc:      SpO2:  97%    Weight:      Height:      PainSc:    Asleep    Isolation Precautions No active  isolations  Medications Medications  metroNIDAZOLE (FLAGYL) IVPB 500 mg (has no administration in time range)  vancomycin (VANCOCIN) 1,500 mg in sodium chloride 0.9 % 500 mL IVPB (1,500 mg Intravenous New Bag/Given 05/11/19 0143)  valproate (DEPACON) 1,750 mg in dextrose 5 % 50 mL IVPB (has no administration in time range)  divalproex (DEPAKOTE) DR tablet 500 mg (has no administration in time range)  LORazepam (ATIVAN) 2 MG/ML injection (2 mg  Given 05/10/19 2312)  levETIRAcetam (KEPPRA) IVPB 1000 mg/100 mL premix (0 mg Intravenous Stopped 05/11/19 0107)  thiamine (B-1) injection 100 mg (100 mg Intravenous Given 05/10/19 2359)  acetaminophen (TYLENOL) tablet 650 mg (650 mg Oral Given 05/10/19 2359)  LORazepam (ATIVAN) 2 MG/ML injection (  Given 05/11/19 0021)  sodium chloride 0.9 % bolus 1,000 mL (1,000 mLs Intravenous New Bag/Given 05/11/19 0108)  ceFEPIme (MAXIPIME) 2 g in sodium chloride 0.9 % 100 mL IVPB (2 g Intravenous New Bag/Given 05/11/19 0135)    Mobility walks Low fall risk   Focused Assessments Cardiac Assessment Handoff:    Lab Results  Component Value Date   CKTOTAL 544 (H) 04/26/2018   CKMB 1.8 01/21/2011   TROPONINI <0.03 04/13/2019   Lab Results  Component Value Date   DDIMER 0.32 01/14/2013   Does the Patient currently have chest pain? No     R Recommendations: See Admitting Provider Note  Report given to:   Additional Notes:

## 2019-05-11 NOTE — Consult Note (Signed)
NEURO HOSPITALIST CONSULT NOTE   Requestig physician: Dr. Leonides Schanz  Reason for Consult: Breakthrough seizures x 4  History obtained from:   Chart    HPI:                                                                                                                                          Russell Carter is an 63 y.o. male with known history of seizures, on valproic acid, bipolar disorder, CKD, DM, HLD, HTN, CVA, polysubstance abuse and CAD who presented from home via EMS after having had 4 seizures in one hour. A seizure lasting 30 seconds was witnessed by EMS. The patient was complaining of headache and neck pain. Also stated "I can't see" but was able to sign a HIPAA release with EMS without difficulty seeing. Vitals on arrival were BP 150/97, HR 132 and 98% on RA. There was no evidence for incontinence or oral trauma. Shortly after arriving to the ED, patient stated that his vision was coming back but was blurry. Also endorsed a fall earlier in the day hitting his head on a headboard in his room.  While in CT he had a full-body seizure. Ativan 2 mg was administered. He had another seizure-like episode lasting less than 2 minutes, witnessed by provider. Of note, immediately following this episode, the patient was able to state his name, location, the date and his reason for being in the department. He was loaded with Keppra 1000 mg IV in the ED.   Past Medical History:  Diagnosis Date  . Acute kidney injury ([place]) 04/27/2018  . AKI (acute kidney injury) ([place])   . Anginal pain ([place])   . Anxiety   . Arthritis   . Bipolar disorder ([place])   . Blood in stool   . CAD (coronary artery disease)    a. 2011: cath showing mod non-obstructive disease b. 02/2016: cath with 95% stenosis in the Mid RCA. DES placed.  . Cervical stenosis of spine   . Chronic anticoagulation 04/27/2018  . Chronic pain disorder 06/05/2017  . CKD (chronic kidney disease), stage III ([place]) 05/06/2018  . Cocaine  abuse ([place])    history  . Complete heart block ([place]) 03/14/2016  . Depression   . Diabetes mellitus type 2 in nonobese (HCC)   . Diabetes mellitus without complication ([place])   . Dyslipidemia 03/15/2016  . Essential hypertension 05/18/2009   Qualifier: Diagnosis of  By: Burnett Kanaris    . Gastroesophageal reflux disease without esophagitis 07/31/2017  . Gout, unspecified   . Headache   . History of CVA (cerebrovascular accident)   . History of pulmonary embolus (PE) 02/14/2017  . HTN (hypertension)   . Hypercholesterolemia   . Hyperglycemia   . Leukopenia 05/06/2018  . Migraines   .  Myocardial infarction [facility]) 2008   drug cocaine and marijuana at time of mi none  since    . Normocytic anemia 05/06/2018  . Pneumonia   . Polysubstance abuse ([place]) 01/15/2013  . Rectal bleeding   . Renal insufficiency   . Seizure ([place]) 05/06/2018  . Seizures ([place])   . Stroke [facility])     Past Surgical History:  Procedure Laterality Date  . ANTERIOR CERVICAL DECOMP/DISCECTOMY FUSION N/A 01/24/2017   Procedure: Cervical Four-Five,Cervical Five Six,Cervical Six-Seven Anterior cervical discectomy with fusion and plate with Cervical Three-Thoracic- One Laminectomy/Foraminotomy with Cervical Three-Thoracic- One Fixation and fusion;  Surgeon: Kevan Ny Ditty, MD;  Location: [place];  Service: Neurosurgery;  Laterality: N/A;  Part-1 anterior approach  . CARDIAC CATHETERIZATION  2008; ~ 2011   [place]-40%lad  . CARDIAC CATHETERIZATION N/A 03/16/2016   Procedure: Left Heart Cath and Coronary Angiography;  Surgeon: Leonie Man, MD;  Location: [place] CV LAB;  Service: Cardiovascular;  Laterality: N/A;  . COLONOSCOPY WITH PROPOFOL N/A 04/17/2018   Procedure: COLONOSCOPY WITH PROPOFOL;  Surgeon: Wonda Horner, MD;  Location: [facility] ENDOSCOPY;  Service: Endoscopy;  Laterality: N/A;  . CORONARY ANGIOPLASTY     STENTS  . EYE SURGERY Bilateral    "for double vision"  . INGUINAL HERNIA REPAIR  05/12/2012    Procedure: HERNIA REPAIR INGUINAL ADULT;  Surgeon: Zenovia Jarred, MD;  Location: [place];  Service: General;  Laterality: Right;  . INGUINAL HERNIA REPAIR Left 02/02/2003   Dr. Georganna Skeans. repair with mesh. Same procedure done again in  05/22/2004   . INGUINAL HERNIA REPAIR Left ?date 2nd OR  . LEFT HEART CATH AND CORONARY ANGIOGRAPHY N/A 04/29/2018   Procedure: LEFT HEART CATH AND CORONARY ANGIOGRAPHY;  Surgeon: Martinique, Peter M, MD;  Location: [place] CV LAB;  Service: Cardiovascular;  Laterality: N/A;  . POSTERIOR CERVICAL FUSION/FORAMINOTOMY N/A 01/24/2017   Procedure: Cervical Three-Thoracic One-Laminectomy/Foraminotomy with Cervical Three-Thoracic One Fixation and fusion;  Surgeon: Kevan Ny Ditty, MD;  Location: [place];  Service: Neurosurgery;  Laterality: N/A;  Part-2 Posterior approach  . RECONSTRUCT / STABILIZE DISTAL ULNA    . SHOULDER OPEN ROTATOR CUFF REPAIR Left 2010  . SHOULDER SURGERY Left    "injured on the job; had to cut small bone out"  . THUMB FUSION Right     Family History  Problem Relation Age of Onset  . Heart attack Mother        x7. Still alive  . Heart attack Father        deceased b/c of MI  . Heart attack Brother        older, deceased b/c of MI   . Cancer Brother        bone cancer in 1 brother, ? brain cancer in other brother  . Heart attack Brother   . Other Brother        MVA  . Other Brother   . Bone cancer Brother   . Cancer Brother   . SIDS Sister               Social History:  reports that he has never smoked. He has never used smokeless tobacco. He reports current alcohol use. He reports current drug use. Drugs: "Crack" cocaine, Marijuana, and Cocaine.  Allergies  Allergen Reactions  . Shellfish Allergy Anaphylaxis    MEDICATIONS:  No current facility-administered medications on file prior to  encounter.    Current Outpatient Medications on File Prior to Encounter  Medication Sig Dispense Refill  . allopurinol (ZYLOPRIM) 300 MG tablet Take 1 tablet (300 mg total) by mouth daily. 90 tablet 0  . amLODipine (NORVASC) 5 MG tablet Take 1 tablet (5 mg total) by mouth daily. 30 tablet 2  . aspirin EC 81 MG tablet Take 1 tablet (81 mg total) by mouth daily. 30 tablet 2  . Calcium Carb-Cholecalciferol (CALCIUM-VITAMIN D) 600-400 MG-UNIT TABS Take 1 tablet by mouth daily. 30 tablet 5  . divalproex (DEPAKOTE) 500 MG DR tablet Take 1 tablet (500 mg total) by mouth 2 (two) times daily. 180 tablet 0  . DULoxetine (CYMBALTA) 30 MG capsule Take 1 capsule (30 mg total) by mouth 2 (two) times daily. 60 capsule 2  . folic acid (FOLVITE) 1 MG tablet Take 1 tablet (1 mg total) by mouth daily. 30 tablet 5  . gabapentin (NEURONTIN) 400 MG capsule Take 1 capsule (400 mg total) by mouth 2 (two) times daily. 180 capsule 3  . gabapentin (NEURONTIN) 600 MG tablet Take 600 mg by mouth 4 (four) times daily.  0  . hydrOXYzine (ATARAX/VISTARIL) 25 MG tablet Take 1 tablet (25 mg total) by mouth daily as needed. 30 tablet 1  . losartan (COZAAR) 100 MG tablet Take 1 tablet (100 mg total) by mouth daily. 30 tablet 2  . methocarbamol (ROBAXIN) 750 MG tablet Take 1 tablet (750 mg total) by mouth every 8 (eight) hours as needed for muscle spasms. 90 tablet 1  . metoCLOPramide (REGLAN) 10 MG tablet TAKE 1 TABLET BY MOUTH EVERY 6 HOURS AS NEEDED FOR NAUSEA OR HEADACHE. 10 tablet 3  . Multiple Vitamin (MULTIVITAMIN WITH MINERALS) TABS tablet Take 1 tablet by mouth daily. 30 tablet 5  . NARCAN 4 MG/0.1ML LIQD nasal spray kit USE 1 (ONE) SPRAY AS NEEDED    . nitroGLYCERIN (NITROSTAT) 0.4 MG SL tablet Place 1 tablet (0.4 mg total) under the tongue every 5 (five) minutes as needed for chest pain. 25 tablet 2  . oxyCODONE-acetaminophen (PERCOCET) 7.5-325 MG tablet Take 1 tablet by mouth 3 (three) times daily. 20 tablet 0  .  rosuvastatin (CRESTOR) 40 MG tablet Take 1 tablet (40 mg total) by mouth daily. 30 tablet 6  . traZODone (DESYREL) 100 MG tablet Take 1 tablet (100 mg total) by mouth at bedtime as needed for sleep. 30 tablet 5  . warfarin (COUMADIN) 5 MG tablet Take 5 mg by mouth. As directed       ROS:                                                                                                                                       The patient is a poor historian and has difficulty complying with exam and interview. ROS as per HPI. Unable to obtain  additional ROS.    Blood pressure (!) 159/97, pulse (!) 116, temperature 100.1 F (37.8 C), temperature source Rectal, resp. rate 19, height '5\' 11"'$  (1.803 m), weight 92.1 kg, SpO2 97 %.   General Examination:                                                                                                       Physical Exam  HEENT-  [place]/AT  Lungs- Respirations unlabored Extremities- No edema  Neurological Examination Mental Status: Alert, oriented to day of week, year and month. Somewhat oppositional with decreased responses to questions and commands. Poor eye contact. Limited speech output is fluent. Naming intact. Able to follow commands but is inconsistent in degree of cooperation. . Cranial Nerves: II:  Visual fields grossly normal with no extinction to DSS. PERRL.   III,IV, VI: EOMI without nystagmus. No ptosis.  V,VII: No facial droop. Does not respond when testing facial sensation.  VIII: hearing intact to voice IX,X: Hypophonic speech XI: Head is midline XII: midline tongue extension Motor: Right : Upper extremity   5/5    Left:     Upper extremity   5/5  Lower extremity   5/5     Lower extremity   5/5 Sensory: Light touch intact x 4. No extinction to DSS.  Deep Tendon Reflexes: 1+ bilateral brachioradialis. Trace patellar reflexes Plantars: Deferred due to patient's somewhat irritated affect Cerebellar: No ataxia with FNF bilaterally  Gait:  Deferred   Lab Results: Basic Metabolic Panel: Recent Labs  Lab 05/10/19 2345  NA 136  K 3.9  CL 106  CO2 19*  GLUCOSE 143*  BUN 11  CREATININE 1.25*  CALCIUM 9.1    CBC: Recent Labs  Lab 05/10/19 2345  WBC 8.6  NEUTROABS 6.1  HGB 14.7  HCT 46.0  MCV 91.8  PLT 222    Cardiac Enzymes: No results for input(s): CKTOTAL, CKMB, CKMBINDEX, TROPONINI in the last 168 hours.  Lipid Panel: No results for input(s): CHOL, TRIG, HDL, CHOLHDL, VLDL, LDLCALC in the last 168 hours.  Imaging: Dg Thoracic Spine 2 View  Result Date: 05/11/2019 CLINICAL DATA:  Back pain EXAM: THORACIC SPINE 2 VIEWS COMPARISON:  None. FINDINGS: There is no evidence of thoracic spine fracture. Alignment is normal. No other significant bone abnormalities are identified. IMPRESSION: Negative. Electronically Signed   By: Ulyses Jarred M.D.   On: 05/11/2019 01:11   Dg Lumbar Spine Complete  Result Date: 05/11/2019 CLINICAL DATA:  63 year old male with back pain. EXAM: LUMBAR SPINE - COMPLETE 4+ VIEW COMPARISON:  Abdominal radiograph dated 05/06/2018 and CT dated 04/30/2018 FINDINGS: There is no acute fracture or subluxation of the lumbar spine. The vertebral body heights and disc spaces are maintained. Multilevel facet arthropathy. Grade 1 L3-L4 retrolisthesis. The soft tissues are unremarkable. IMPRESSION: 1. No acute/traumatic lumbar spine pathology. 2. Degenerative changes. Electronically Signed   By: Anner Crete M.D.   On: 05/11/2019 01:12   Ct Head Wo Contrast  Result Date: 05/11/2019 CLINICAL DATA:  Headache, post traumatic; C-spine fx, traumatic headache and neck pain. Seizure.  EXAM: CT HEAD WITHOUT CONTRAST CT CERVICAL SPINE WITHOUT CONTRAST TECHNIQUE: Multidetector CT imaging of the head and cervical spine was performed following the standard protocol without intravenous contrast. Multiplanar CT image reconstructions of the cervical spine were also generated. COMPARISON:  Multiple prior exams most  recently head and cervical spine CT 04/13/2019 FINDINGS: CT HEAD FINDINGS Brain: No intracranial hemorrhage, mass effect, or midline shift. Age related atrophy. Similar chronic small vessel ischemia to prior. No hydrocephalus. The basilar cisterns are patent. No evidence of territorial infarct or acute ischemia. No extra-axial or intracranial fluid collection. Vascular: No hyperdense vessel. Skull: No fracture or focal lesion. Sinuses/Orbits: Mella mucosal thickening of ethmoid air cells. No sinus fluid levels. Mastoid air cells are clear. Orbits are unremarkable. Other: None. CT CERVICAL SPINE FINDINGS Alignment: Postsurgical straightening. No traumatic subluxation. Skull base and vertebrae: No acute fracture. Dens and skull base are intact. Posterior C3 through T1 fusion and laminectomy. Anterior C4 through C7 fusion. No acute fracture. The dens and skull base are intact. Streak artifact from surgical hardware. Soft tissues and spinal canal: No prevertebral soft tissue edema. Canal partially obscured by streak artifact. Disc levels: Anterior fusion with discectomy C4-C5 through C6-C7. Mild endplate spurring F6-O1. Upper chest: No acute findings. Other: None. IMPRESSION: 1. No acute intracranial abnormality. No skull fracture. Mild chronic small vessel ischemia is unchanged from prior. 2. Postsurgical change in the cervical spine without acute fracture or subluxation. Electronically Signed   By: Keith Rake M.D.   On: 05/11/2019 01:20   Ct Cervical Spine Wo Contrast  Result Date: 05/11/2019 CLINICAL DATA:  Headache, post traumatic; C-spine fx, traumatic headache and neck pain. Seizure. EXAM: CT HEAD WITHOUT CONTRAST CT CERVICAL SPINE WITHOUT CONTRAST TECHNIQUE: Multidetector CT imaging of the head and cervical spine was performed following the standard protocol without intravenous contrast. Multiplanar CT image reconstructions of the cervical spine were also generated. COMPARISON:  Multiple prior exams  most recently head and cervical spine CT 04/13/2019 FINDINGS: CT HEAD FINDINGS Brain: No intracranial hemorrhage, mass effect, or midline shift. Age related atrophy. Similar chronic small vessel ischemia to prior. No hydrocephalus. The basilar cisterns are patent. No evidence of territorial infarct or acute ischemia. No extra-axial or intracranial fluid collection. Vascular: No hyperdense vessel. Skull: No fracture or focal lesion. Sinuses/Orbits: [place] mucosal thickening of ethmoid air cells. No sinus fluid levels. Mastoid air cells are clear. Orbits are unremarkable. Other: None. CT CERVICAL SPINE FINDINGS Alignment: Postsurgical straightening. No traumatic subluxation. Skull base and vertebrae: No acute fracture. Dens and skull base are intact. Posterior C3 through T1 fusion and laminectomy. Anterior C4 through C7 fusion. No acute fracture. The dens and skull base are intact. Streak artifact from surgical hardware. Soft tissues and spinal canal: No prevertebral soft tissue edema. Canal partially obscured by streak artifact. Disc levels: Anterior fusion with discectomy C4-C5 through C6-C7. Mild endplate spurring H0-Q6. Upper chest: No acute findings. Other: None. IMPRESSION: 1. No acute intracranial abnormality. No skull fracture. Mild chronic small vessel ischemia is unchanged from prior. 2. Postsurgical change in the cervical spine without acute fracture or subluxation. Electronically Signed   By: Keith Rake M.D.   On: 05/11/2019 01:20   Dg Chest Portable 1 View  Result Date: 05/11/2019 CLINICAL DATA:  63 year old male with cough EXAM: PORTABLE CHEST 1 VIEW COMPARISON:  Chest radiograph dated 04/09/2019 FINDINGS: Shallow inspiration. There is mild eventration of the left hemidiaphragm with associated subsegmental atelectasis. No focal consolidation, pleural effusion, or pneumothorax. The cardiac silhouette is  within normal limits. No acute osseous pathology. Extensive cervical spine fixation hardware.  IMPRESSION: No active disease. Electronically Signed   By: Anner Crete M.D.   On: 05/11/2019 00:29    Assessment: 63 year old male with breakthrough seizures x 4 in setting of possible semicompliance versus insufficient dosing of his home valproic acid regimen 1. Exam reveals no focal deficit, but is limited by decreased patient cooperation.  2. Also complained this evening of lateralized weakness. This has been worked up in the past with negative imaging. MRI brain from 12/25/18 showed mild chronic small vessel disease. No evidence for lateralized weakness on current neurological exam.  3. Valproic acid level is low at 36 4. CT head negative for acute intracranial abnormality. No skull fracture. Mild chronic small vessel ischemia is unchanged from prior. There is postsurgical change in the cervical spine without acute fracture or subluxation.  Recommendations: 1. Has been loaded with Keppra 1000 mg IV 2. Supplemental load of valproic acid 20 mg/kg x 1, followed by resumption of his home Depakote regimen of 500 mg po BID.  3. EEG in AM to rule out possible subclinical seizure activity    Electronically signed: Dr. Kerney Elbe 05/11/2019, 1:45 AM

## 2019-05-11 NOTE — ED Notes (Signed)
Patient transported to CT 

## 2019-05-11 NOTE — H&P (Signed)
History and Physical    Russell Carter DXA:[id number] DOB: 17-Oct-1956 DOA: 05/10/2019  PCP: Ladell Pier, MD  Patient coming from: Home.  Chief Complaint: Seizures.  HPI: Russell Carter is a 63 y.o. male with history of seizure, hypertension, pulmonary embolism on Coumadin, polysubstance abuse, chronic kidney disease stage II, nonobstructive CAD, chronic pain was brought to the ER after patient was found to have 4 witnessed seizures back-to-back lasting for less than 2 minutes within a 1 hour.  By the time I examined the patient he had received Ativan and has been mildly lethargic.  Most of the history was obtained from the ER physician.  Patient has stated to ER physician that he has been feeling dizzy and weak last few days had some diarrhea.  Had complained of some weakness of the left side.  ED Course: In the ER patient had 2 more seizures lasting for less than 2 minutes one was lasting half a minute.  Patient was given Ativan followed by Keppra IV 1 g.  Following which Depakote levels came at around 36 for which patient was loaded with Depakote.  Neurology was consulted.  CT head CT C-spine L-spine T-spine were unremarkable.  There was some skin discoloration around the right shoulder area.  At the time of my exam patient is very lethargic after receiving Ativan.  Since patient was mildly febrile with temperature 100.1 degree with an elevated lactate there was concern for possible evolving sepsis for which patient was started on antibiotics.  But patient chest x-ray COVID-19 testing UA was negative.  Review of Systems: As per HPI, rest all negative.   Past Medical History:  Diagnosis Date  . Acute kidney injury ([place]) 04/27/2018  . AKI (acute kidney injury) ([place])   . Anginal pain ([place])   . Anxiety   . Arthritis   . Bipolar disorder ([place])   . Blood in stool   . CAD (coronary artery disease)    a. 2011: cath showing mod non-obstructive disease b. 02/2016: cath with 95% stenosis in the  Mid RCA. DES placed.  . Cervical stenosis of spine   . Chronic anticoagulation 04/27/2018  . Chronic pain disorder 06/05/2017  . CKD (chronic kidney disease), stage III ([place]) 05/06/2018  . Cocaine abuse ([place])    history  . Complete heart block ([facility]) 03/14/2016  . Depression   . Diabetes mellitus type 2 in nonobese (H[facility])   . Diabetes mellitus without complication ([place])   . Dyslipidemia 03/15/2016  . Essential hypertension 05/18/2009   Qualifier: Diagnosis of  By: Burnett Kanaris    . Gastroesophageal reflux disease without esophagitis 07/31/2017  . Gout, unspecified   . Headache   . History of CVA (cerebrovascular accident)   . History of pulmonary embolus (PE) 02/14/2017  . HTN (hypertension)   . Hypercholesterolemia   . Hyperglycemia   . Leukopenia 05/06/2018  . Migraines   . Myocardial infarction [facility]) 2008   drug cocaine and marijuana at time of mi none  since    . Normocytic anemia 05/06/2018  . Pneumonia   . Polysubstance abuse ([place]) 01/15/2013  . Rectal bleeding   . Renal insufficiency   . Seizure ([place]) 05/06/2018  . Seizures ([place])   . Stroke [facility])     Past Surgical History:  Procedure Laterality Date  . ANTERIOR CERVICAL DECOMP/DISCECTOMY FUSION N/A 01/24/2017   Procedure: Cervical Four-Five,Cervical Five Six,Cervical Six-Seven Anterior cervical discectomy with fusion and plate with Cervical Three-Thoracic- One Laminectomy/Foraminotomy with Cervical Three-Thoracic- One Fixation  and fusion;  Surgeon: Kevan Ny Ditty, MD;  Location: [place];  Service: Neurosurgery;  Laterality: N/A;  Part-1 anterior approach  . CARDIAC CATHETERIZATION  2008; ~ 2011   [place]-40%lad  . CARDIAC CATHETERIZATION N/A 03/16/2016   Procedure: Left Heart Cath and Coronary Angiography;  Surgeon: Leonie Man, MD;  Location: [place] CV LAB;  Service: Cardiovascular;  Laterality: N/A;  . COLONOSCOPY WITH PROPOFOL N/A 04/17/2018   Procedure: COLONOSCOPY WITH PROPOFOL;  Surgeon: Wonda Horner, MD;   Location: [facility] ENDOSCOPY;  Service: Endoscopy;  Laterality: N/A;  . CORONARY ANGIOPLASTY     STENTS  . EYE SURGERY Bilateral    "for double vision"  . INGUINAL HERNIA REPAIR  05/12/2012   Procedure: HERNIA REPAIR INGUINAL ADULT;  Surgeon: Zenovia Jarred, MD;  Location: [name];  Service: General;  Laterality: Right;  . INGUINAL HERNIA REPAIR Left 02/02/2003   Dr. Georganna Skeans. repair with mesh. Same procedure done again in  05/22/2004   . INGUINAL HERNIA REPAIR Left ?date 2nd OR  . LEFT HEART CATH AND CORONARY ANGIOGRAPHY N/A 04/29/2018   Procedure: LEFT HEART CATH AND CORONARY ANGIOGRAPHY;  Surgeon: Martinique, Peter M, MD;  Location: [place] CV LAB;  Service: Cardiovascular;  Laterality: N/A;  . POSTERIOR CERVICAL FUSION/FORAMINOTOMY N/A 01/24/2017   Procedure: Cervical Three-Thoracic One-Laminectomy/Foraminotomy with Cervical Three-Thoracic One Fixation and fusion;  Surgeon: Kevan Ny Ditty, MD;  Location: [place];  Service: Neurosurgery;  Laterality: N/A;  Part-2 Posterior approach  . RECONSTRUCT / STABILIZE DISTAL ULNA    . SHOULDER OPEN ROTATOR CUFF REPAIR Left 2010  . SHOULDER SURGERY Left    "injured on the job; had to cut small bone out"  . THUMB FUSION Right      reports that he has never smoked. He has never used smokeless tobacco. He reports current alcohol use. He reports current drug use. Drugs: "Crack" cocaine, Marijuana, and Cocaine.  Allergies  Allergen Reactions  . Shellfish Allergy Anaphylaxis    Family History  Problem Relation Age of Onset  . Heart attack Mother        x7. Still alive  . Heart attack Father        deceased b/c of MI  . Heart attack Brother        older, deceased b/c of MI   . Cancer Brother        bone cancer in 1 brother, ? brain cancer in other brother  . Heart attack Brother   . Other Brother        MVA  . Other Brother   . Bone cancer Brother   . Cancer Brother   . SIDS Sister     Prior to Admission medications    Medication Sig Start Date End Date Taking? Authorizing Provider  allopurinol (ZYLOPRIM) 300 MG tablet Take 1 tablet (300 mg total) by mouth daily. 02/18/19   Ladell Pier, MD  amLODipine (NORVASC) 5 MG tablet Take 1 tablet (5 mg total) by mouth daily. 12/18/18   Ladell Pier, MD  aspirin EC 81 MG tablet Take 1 tablet (81 mg total) by mouth daily. 12/26/18 12/26/19  Vonzella Nipple, NP  Calcium Carb-Cholecalciferol (CALCIUM-VITAMIN D) 600-400 MG-UNIT TABS Take 1 tablet by mouth daily. 05/07/18   Roxan Hockey, MD  divalproex (DEPAKOTE) 500 MG DR tablet Take 1 tablet (500 mg total) by mouth 2 (two) times daily. 02/20/19   Ladell Pier, MD  DULoxetine (CYMBALTA) 30 MG capsule Take  1 capsule (30 mg total) by mouth 2 (two) times daily. 01/20/19   Ladell Pier, MD  folic acid (FOLVITE) 1 MG tablet Take 1 tablet (1 mg total) by mouth daily. 05/07/18   Roxan Hockey, MD  gabapentin (NEURONTIN) 400 MG capsule Take 1 capsule (400 mg total) by mouth 2 (two) times daily. 05/07/18   Roxan Hockey, MD  gabapentin (NEURONTIN) 600 MG tablet Take 600 mg by mouth 4 (four) times daily. 07/24/18   [provider]  hydrOXYzine (ATARAX/VISTARIL) 25 MG tablet Take 1 tablet (25 mg total) by mouth daily as needed. 04/21/19   Ladell Pier, MD  losartan (COZAAR) 100 MG tablet Take 1 tablet (100 mg total) by mouth daily. 12/18/18   Ladell Pier, MD  methocarbamol (ROBAXIN) 750 MG tablet Take 1 tablet (750 mg total) by mouth every 8 (eight) hours as needed for muscle spasms. 05/07/18   Roxan Hockey, MD  metoCLOPramide (REGLAN) 10 MG tablet TAKE 1 TABLET BY MOUTH EVERY 6 HOURS AS NEEDED FOR NAUSEA OR HEADACHE. 01/05/19   Ladell Pier, MD  Multiple Vitamin (MULTIVITAMIN WITH MINERALS) TABS tablet Take 1 tablet by mouth daily. 05/07/18   Roxan Hockey, MD  NARCAN 4 MG/0.1ML LIQD nasal spray kit USE 1 (ONE) SPRAY AS NEEDED 02/12/19   [provider]  nitroGLYCERIN  (NITROSTAT) 0.4 MG SL tablet Place 1 tablet (0.4 mg total) under the tongue every 5 (five) minutes as needed for chest pain. 02/20/19   Ladell Pier, MD  oxyCODONE-acetaminophen (PERCOCET) 7.5-325 MG tablet Take 1 tablet by mouth 3 (three) times daily. 12/26/18 12/26/19  Vonzella Nipple, NP  rosuvastatin (CRESTOR) 40 MG tablet Take 1 tablet (40 mg total) by mouth daily. 04/21/19   Ladell Pier, MD  traZODone (DESYREL) 100 MG tablet Take 1 tablet (100 mg total) by mouth at bedtime as needed for sleep. 05/07/18   Roxan Hockey, MD  warfarin (COUMADIN) 5 MG tablet Take 5 mg by mouth. As directed 01/01/19   [provider]    Physical Exam: Constitutional: Moderately built and nourished. Vitals:   05/11/19 0000 05/11/19 0115 05/11/19 0130 05/11/19 0300  BP: (!) 145/89  (!) 159/97 137/85  Pulse:  (!) 116  96  Resp: (!) _0 Temp:      TempSrc:      SpO2:  97%  96%  Weight:      Height:       Eyes: Anicteric no pallor. ENMT: No discharge from the ears eyes nose or mouth. Neck: No mass felt.  No neck rigidity. Respiratory: No rhonchi or crepitations. Cardiovascular: S1-S2 heard. Abdomen: Soft nontender bowel sounds present. Musculoskeletal: No edema.  No joint effusion. Skin: No rash. Neurologic: Patient is mildly lethargic but moves all extremities oriented to his name and place.  No facial asymmetry.  Pupils are equal and reacting to light. Psychiatric: Oriented to name and place.   Labs on Admission: I have personally reviewed following labs and imaging studies  CBC: Recent Labs  Lab 05/10/19 2345  WBC 8.6  NEUTROABS 6.1  HGB 14.7  HCT 46.0  MCV 91.8  PLT 347   Basic Metabolic Panel: Recent Labs  Lab 05/10/19 2345  NA 136  K 3.9  CL 106  CO2 19*  GLUCOSE 143*  BUN 11  CREATININE 1.25*  CALCIUM 9.1   GFR: Estimated Creatinine Clearance: 69.2 mL/min (A) (by C-G formula based on SCr of 1.25 mg/dL (H)). Liver Function Tests:  Recent Labs   Lab 05/10/19 2345  AST 21  ALT 15  ALKPHOS 46  BILITOT 0.3  PROT 6.6  ALBUMIN 3.5   No results for input(s): LIPASE, AMYLASE in the last 168 hours. No results for input(s): AMMONIA in the last 168 hours. Coagulation Profile: Recent Labs  Lab 05/10/19 2345  INR 1.4*   Cardiac Enzymes: No results for input(s): CKTOTAL, CKMB, CKMBINDEX, TROPONINI in the last 168 hours. BNP (last 3 results) No results for input(s): PROBNP in the last 8760 hours. HbA1C: No results for input(s): HGBA1C in the last 72 hours. CBG: Recent Labs  Lab 05/10/19 2358  GLUCAP 216*   Lipid Profile: No results for input(s): CHOL, HDL, LDLCALC, TRIG, CHOLHDL, LDLDIRECT in the last 72 hours. Thyroid Function Tests: No results for input(s): TSH, T4TOTAL, FREET4, T3FREE, THYROIDAB in the last 72 hours. Anemia Panel: No results for input(s): VITAMINB12, FOLATE, FERRITIN, TIBC, IRON, RETICCTPCT in the last 72 hours. Urine analysis:    Component Value Date/Time   COLORURINE YELLOW 05/10/2019 2250   APPEARANCEUR CLEAR 05/10/2019 2250   LABSPEC 1.021 05/10/2019 2250   PHURINE 5.0 05/10/2019 2250   GLUCOSEU NEGATIVE 05/10/2019 2250   HGBUR NEGATIVE 05/10/2019 2250   BILIRUBINUR NEGATIVE 05/10/2019 2250   BILIRUBINUR negative 09/08/2018 1640   KETONESUR NEGATIVE 05/10/2019 2250   PROTEINUR 30 (A) 05/10/2019 2250   UROBILINOGEN 2.0 (A) 09/08/2018 1640   UROBILINOGEN 1.0 02/04/2015 0647   NITRITE NEGATIVE 05/10/2019 2250   LEUKOCYTESUR NEGATIVE 05/10/2019 2250   Sepsis Labs: _0 (procalcitonin:4,lacticidven:4) ) Recent Results (from the past 240 hour(s))  SARS Coronavirus 2 (CEPHEID - Performed in [place] hospital lab), Hosp Order     Status: None   Collection Time: 05/11/19 12:05 AM   Specimen: Nasopharyngeal Swab  Result Value Ref Range Status   SARS Coronavirus 2 NEGATIVE NEGATIVE Final    Comment: (NOTE) If result is NEGATIVE SARS-CoV-2 target nucleic acids are NOT DETECTED. The  SARS-CoV-2 RNA is generally detectable in upper and lower  respiratory specimens during the acute phase of infection. The lowest  concentration of SARS-CoV-2 viral copies this assay can detect is 250  copies / mL. A negative result does not preclude SARS-CoV-2 infection  and should not be used as the sole basis for treatment or other  patient management decisions.  A negative result may occur with  improper specimen collection / handling, submission of specimen other  than nasopharyngeal swab, presence of viral mutation(s) within the  areas targeted by this assay, and inadequate number of viral copies  (<250 copies / mL). A negative result must be combined with clinical  observations, patient history, and epidemiological information. If result is POSITIVE SARS-CoV-2 target nucleic acids are DETECTED. The SARS-CoV-2 RNA is generally detectable in upper and lower  respiratory specimens dur ing the acute phase of infection.  Positive  results are indicative of active infection with SARS-CoV-2.  Clinical  correlation with patient history and other diagnostic information is  necessary to determine patient infection status.  Positive results do  not rule out bacterial infection or co-infection with other viruses. If result is PRESUMPTIVE POSTIVE SARS-CoV-2 nucleic acids MAY BE PRESENT.   A presumptive positive result was obtained on the submitted specimen  and confirmed on repeat testing.  While 2019 novel coronavirus  (SARS-CoV-2) nucleic acids may be present in the submitted sample  additional confirmatory testing may be necessary for epidemiological  and / or clinical management purposes  to differentiate between  SARS-CoV-2 and other  Sarbecovirus currently known to infect humans.  If clinically indicated additional testing with an alternate test  methodology [phone]) is advised. The SARS-CoV-2 RNA is generally  detectable in upper and lower respiratory sp ecimens during the acute   phase of infection. The expected result is Negative. Fact Sheet for Patients:  StrictlyIdeas.no Fact Sheet for Healthcare Providers: BankingDealers.co.za This test is not yet approved or cleared by the [place] FDA and has been authorized for detection and/or diagnosis of SARS-CoV-2 by FDA under an Emergency Use Authorization (EUA).  This EUA will remain in effect (meaning this test can be used) for the duration of the COVID-19 declaration under Section 564(b)(1) of the Act, 21 U.S.C. section 360bbb-3(b)(1), unless the authorization is terminated or revoked sooner. Performed at [facility], [place] [address] [place], [place] [address]      Radiological Exams on Admission: Dg Thoracic Spine 2 View  Result Date: 05/11/2019 CLINICAL DATA:  Back pain EXAM: THORACIC SPINE 2 VIEWS COMPARISON:  None. FINDINGS: There is no evidence of thoracic spine fracture. Alignment is normal. No other significant bone abnormalities are identified. IMPRESSION: Negative. Electronically Signed   By: Ulyses Jarred M.D.   On: 05/11/2019 01:11   Dg Lumbar Spine Complete  Result Date: 05/11/2019 CLINICAL DATA:  63 year old male with back pain. EXAM: LUMBAR SPINE - COMPLETE 4+ VIEW COMPARISON:  Abdominal radiograph dated 05/06/2018 and CT dated 04/30/2018 FINDINGS: There is no acute fracture or subluxation of the lumbar spine. The vertebral body heights and disc spaces are maintained. Multilevel facet arthropathy. Grade 1 L3-L4 retrolisthesis. The soft tissues are unremarkable. IMPRESSION: 1. No acute/traumatic lumbar spine pathology. 2. Degenerative changes. Electronically Signed   By: Anner Crete M.D.   On: 05/11/2019 01:12   Ct Head Wo Contrast  Result Date: 05/11/2019 CLINICAL DATA:  Headache, post traumatic; C-spine fx, traumatic headache and neck pain. Seizure. EXAM: CT HEAD WITHOUT CONTRAST CT CERVICAL SPINE WITHOUT CONTRAST TECHNIQUE: Multidetector  CT imaging of the head and cervical spine was performed following the standard protocol without intravenous contrast. Multiplanar CT image reconstructions of the cervical spine were also generated. COMPARISON:  Multiple prior exams most recently head and cervical spine CT 04/13/2019 FINDINGS: CT HEAD FINDINGS Brain: No intracranial hemorrhage, mass effect, or midline shift. Age related atrophy. Similar chronic small vessel ischemia to prior. No hydrocephalus. The basilar cisterns are patent. No evidence of territorial infarct or acute ischemia. No extra-axial or intracranial fluid collection. Vascular: No hyperdense vessel. Skull: No fracture or focal lesion. Sinuses/Orbits: Weider mucosal thickening of ethmoid air cells. No sinus fluid levels. Mastoid air cells are clear. Orbits are unremarkable. Other: None. CT CERVICAL SPINE FINDINGS Alignment: Postsurgical straightening. No traumatic subluxation. Skull base and vertebrae: No acute fracture. Dens and skull base are intact. Posterior C3 through T1 fusion and laminectomy. Anterior C4 through C7 fusion. No acute fracture. The dens and skull base are intact. Streak artifact from surgical hardware. Soft tissues and spinal canal: No prevertebral soft tissue edema. Canal partially obscured by streak artifact. Disc levels: Anterior fusion with discectomy C4-C5 through C6-C7. Mild endplate spurring R4-E3. Upper chest: No acute findings. Other: None. IMPRESSION: 1. No acute intracranial abnormality. No skull fracture. Mild chronic small vessel ischemia is unchanged from prior. 2. Postsurgical change in the cervical spine without acute fracture or subluxation. Electronically Signed   By: Keith Rake M.D.   On: 05/11/2019 01:20   Ct Cervical Spine Wo Contrast  Result Date: 05/11/2019 CLINICAL DATA:  Headache, post  traumatic; C-spine fx, traumatic headache and neck pain. Seizure. EXAM: CT HEAD WITHOUT CONTRAST CT CERVICAL SPINE WITHOUT CONTRAST TECHNIQUE:  Multidetector CT imaging of the head and cervical spine was performed following the standard protocol without intravenous contrast. Multiplanar CT image reconstructions of the cervical spine were also generated. COMPARISON:  Multiple prior exams most recently head and cervical spine CT 04/13/2019 FINDINGS: CT HEAD FINDINGS Brain: No intracranial hemorrhage, mass effect, or midline shift. Age related atrophy. Similar chronic small vessel ischemia to prior. No hydrocephalus. The basilar cisterns are patent. No evidence of territorial infarct or acute ischemia. No extra-axial or intracranial fluid collection. Vascular: No hyperdense vessel. Skull: No fracture or focal lesion. Sinuses/Orbits: Lindy mucosal thickening of ethmoid air cells. No sinus fluid levels. Mastoid air cells are clear. Orbits are unremarkable. Other: None. CT CERVICAL SPINE FINDINGS Alignment: Postsurgical straightening. No traumatic subluxation. Skull base and vertebrae: No acute fracture. Dens and skull base are intact. Posterior C3 through T1 fusion and laminectomy. Anterior C4 through C7 fusion. No acute fracture. The dens and skull base are intact. Streak artifact from surgical hardware. Soft tissues and spinal canal: No prevertebral soft tissue edema. Canal partially obscured by streak artifact. Disc levels: Anterior fusion with discectomy C4-C5 through C6-C7. Mild endplate spurring F1-M3. Upper chest: No acute findings. Other: None. IMPRESSION: 1. No acute intracranial abnormality. No skull fracture. Mild chronic small vessel ischemia is unchanged from prior. 2. Postsurgical change in the cervical spine without acute fracture or subluxation. Electronically Signed   By: Keith Rake M.D.   On: 05/11/2019 01:20   Dg Chest Portable 1 View  Result Date: 05/11/2019 CLINICAL DATA:  63 year old male with cough EXAM: PORTABLE CHEST 1 VIEW COMPARISON:  Chest radiograph dated 04/09/2019 FINDINGS: Shallow inspiration. There is mild eventration  of the left hemidiaphragm with associated subsegmental atelectasis. No focal consolidation, pleural effusion, or pneumothorax. The cardiac silhouette is within normal limits. No acute osseous pathology. Extensive cervical spine fixation hardware. IMPRESSION: No active disease. Electronically Signed   By: Anner Crete M.D.   On: 05/11/2019 00:29    EKG: Independently reviewed.  Sinus tachycardia.  Assessment/Plan Principal Problem:   Seizure [facility]) Active Problems:   Essential hypertension   Polysubstance abuse ([place])   CAD-50% LAD 2011   Diabetes mellitus type 2 in nonobese ([place])   History of pulmonary embolus (PE)   CKD (chronic kidney disease), stage III ([place])    1. Seizures -per report patient seizures were looking very atypical had a generalized component following which patient was wide-awake and had no postictal phase.  No incontinence of urine or tongue bite.  At this time neurologist recommended taking home dose of Depakote after Depakote loading dose.  EEG and close monitoring telemetry. 2. Febrile illness -patient was mildly febrile in the ER.  Blood cultures were obtained and started on empiric antibiotics.  May discontinue antibiotics if cultures are negative.  No clear source. 3. Depression on  4. Hypertension on amlodipine and ARB.  Follow metabolic panel. 5. Diabetes mellitus type 2 we will keep patient on sliding scale coverage. 6. History of PE on Coumadin.  Coumadin to be dosed per pharmacy. 7. Chronic kidney disease stage II creatinine appears to be at baseline. 8. History of possible abuse drug screen is negative. 9. There is some discoloration of the skin around the posterior aspect of right shoulder for which I have consulted wound team. 10. Hyperlipidemia on statins. 11. History of gout on allopurinol.  Patient states he does not  take gabapentin though it is listed in the medicines. Need to get more detailed history once patient is more alert awake.   DVT  prophylaxis: Coumadin. Code Status: Full code. Family Communication: Discussed with patient. Disposition Plan: Home. Consults called: Neurology. Admission status: Observation.   Rise Patience MD [facility] Pager [phone].  If 7PM-7AM, please contact night-coverage [email or web address] Password [email or web address]  05/11/2019, 3:41 AM

## 2019-05-11 NOTE — Progress Notes (Signed)
ANTICOAGULATION CONSULT NOTE - Initial Consult  Pharmacy Consult for Coumadin Indication: h/o DVT  Allergies  Allergen Reactions  . Shellfish Allergy Anaphylaxis    Patient Measurements: Height: '5\' 11"'$  (180.3 cm) Weight: 203 lb (92.1 kg) IBW/kg (Calculated) : 75.3  Vital Signs: Temp: 100.1 F (37.8 C) (07/19 2327) Temp Source: Rectal (07/19 2327) BP: 137/85 (07/20 0300) Pulse Rate: 96 (07/20 0300)  Labs: Recent Labs    05/10/19 2345  HGB 14.7  HCT 46.0  PLT 222  APTT 32  LABPROT 16.6*  INR 1.4*  CREATININE 1.25*    Estimated Creatinine Clearance: 69.2 mL/min (A) (by C-G formula based on SCr of 1.25 mg/dL (H)).   Medical History: Past Medical History:  Diagnosis Date  . Acute kidney injury ([place]) 04/27/2018  . AKI (acute kidney injury) ([place])   . Anginal pain ([place])   . Anxiety   . Arthritis   . Bipolar disorder ([place])   . Blood in stool   . CAD (coronary artery disease)    a. 2011: cath showing mod non-obstructive disease b. 02/2016: cath with 95% stenosis in the Mid RCA. DES placed.  . Cervical stenosis of spine   . Chronic anticoagulation 04/27/2018  . Chronic pain disorder 06/05/2017  . CKD (chronic kidney disease), stage III ([place]) 05/06/2018  . Cocaine abuse ([place])    history  . Complete heart block ([place]) 03/14/2016  . Depression   . Diabetes mellitus type 2 in nonobese (HCC)   . Diabetes mellitus without complication ([place])   . Dyslipidemia 03/15/2016  . Essential hypertension 05/18/2009   Qualifier: Diagnosis of  By: Burnett Kanaris    . Gastroesophageal reflux disease without esophagitis 07/31/2017  . Gout, unspecified   . Headache   . History of CVA (cerebrovascular accident)   . History of pulmonary embolus (PE) 02/14/2017  . HTN (hypertension)   . Hypercholesterolemia   . Hyperglycemia   . Leukopenia 05/06/2018  . Migraines   . Myocardial infarction [facility]) 2008   drug cocaine and marijuana at time of mi none  since    . Normocytic anemia 05/06/2018   . Pneumonia   . Polysubstance abuse ([place]) 01/15/2013  . Rectal bleeding   . Renal insufficiency   . Seizure ([name]) 05/06/2018  . Seizures ([place])   . Stroke [place])     Medications:  Medications Prior to Admission  Medication Sig Dispense Refill Last Dose  . allopurinol (ZYLOPRIM) 300 MG tablet Take 1 tablet (300 mg total) by mouth daily. 90 tablet 0   . amLODipine (NORVASC) 5 MG tablet Take 1 tablet (5 mg total) by mouth daily. 30 tablet 2   . aspirin EC 81 MG tablet Take 1 tablet (81 mg total) by mouth daily. 30 tablet 2   . Calcium Carb-Cholecalciferol (CALCIUM-VITAMIN D) 600-400 MG-UNIT TABS Take 1 tablet by mouth daily. 30 tablet 5   . divalproex (DEPAKOTE) 500 MG DR tablet Take 1 tablet (500 mg total) by mouth 2 (two) times daily. 180 tablet 0   . DULoxetine (CYMBALTA) 30 MG capsule Take 1 capsule (30 mg total) by mouth 2 (two) times daily. 60 capsule 2   . folic acid (FOLVITE) 1 MG tablet Take 1 tablet (1 mg total) by mouth daily. 30 tablet 5   . gabapentin (NEURONTIN) 400 MG capsule Take 1 capsule (400 mg total) by mouth 2 (two) times daily. 180 capsule 3   . gabapentin (NEURONTIN) 600 MG tablet Take 600 mg by mouth 4 (four) times daily.  0   . hydrOXYzine (ATARAX/VISTARIL) 25 MG tablet Take 1 tablet (25 mg total) by mouth daily as needed. 30 tablet 1   . losartan (COZAAR) 100 MG tablet Take 1 tablet (100 mg total) by mouth daily. 30 tablet 2   . methocarbamol (ROBAXIN) 750 MG tablet Take 1 tablet (750 mg total) by mouth every 8 (eight) hours as needed for muscle spasms. 90 tablet 1   . metoCLOPramide (REGLAN) 10 MG tablet TAKE 1 TABLET BY MOUTH EVERY 6 HOURS AS NEEDED FOR NAUSEA OR HEADACHE. 10 tablet 3   . Multiple Vitamin (MULTIVITAMIN WITH MINERALS) TABS tablet Take 1 tablet by mouth daily. 30 tablet 5   . NARCAN 4 MG/0.1ML LIQD nasal spray kit USE 1 (ONE) SPRAY AS NEEDED     . nitroGLYCERIN (NITROSTAT) 0.4 MG SL tablet Place 1 tablet (0.4 mg total) under the tongue every 5 (five)  minutes as needed for chest pain. 25 tablet 2   . oxyCODONE-acetaminophen (PERCOCET) 7.5-325 MG tablet Take 1 tablet by mouth 3 (three) times daily. 20 tablet 0   . rosuvastatin (CRESTOR) 40 MG tablet Take 1 tablet (40 mg total) by mouth daily. 30 tablet 6   . traZODone (DESYREL) 100 MG tablet Take 1 tablet (100 mg total) by mouth at bedtime as needed for sleep. 30 tablet 5   . warfarin (COUMADIN) 5 MG tablet Take 5 mg by mouth. As directed       Assessment: 63 y.o. male with h/o DVT for Coumadin.  Current home regimen 5 mg MWF, 2.5 mg TTSS  Goal of Therapy:  INR 2-3 Monitor platelets by anticoagulation protocol: Yes   Plan:  Coumadin 7.5 mg today Daily INR  Russell Carter, Bronson Curb 05/11/2019,3:55 AM

## 2019-05-11 NOTE — Plan of Care (Signed)
  Problem: Health Behavior/Discharge Planning: Goal: Ability to manage health-related needs will improve Outcome: Not Progressing  Patient sleeping most of shift.   Problem: Nutrition: Goal: Adequate nutrition will be maintained Outcome: Not Progressing Decreased appetite d/t fatigue and drowsiness. PO encouraged.   Problem: Pain Managment: Goal: General experience of comfort will improve Outcome: Not Progressing  C/O constant pain. Medication given as ordered.

## 2019-05-11 NOTE — ED Notes (Signed)
Patient sustained another seizure like episode for a duration of no more than 85mins. Provider at bedside during episode, 2mg  ativan given. Pt alert and oriented post-episode

## 2019-05-12 ENCOUNTER — Telehealth: Payer: Self-pay | Admitting: Neurology

## 2019-05-12 LAB — URINE CULTURE: Culture: NO GROWTH

## 2019-05-12 LAB — PROTIME-INR
INR: 2.4 — ABNORMAL HIGH (ref 0.8–1.2)
Prothrombin Time: 26 seconds — ABNORMAL HIGH (ref 11.4–15.2)

## 2019-05-12 LAB — GLUCOSE, CAPILLARY: Glucose-Capillary: 106 mg/dL — ABNORMAL HIGH (ref 70–99)

## 2019-05-12 MED ORDER — DIVALPROEX SODIUM 250 MG PO DR TAB
500.0000 mg | DELAYED_RELEASE_TABLET | Freq: Every morning | ORAL | Status: DC
  Administered 2019-05-13: 500 mg via ORAL
  Filled 2019-05-12: qty 2

## 2019-05-12 MED ORDER — DIVALPROEX SODIUM 250 MG PO DR TAB
1000.0000 mg | DELAYED_RELEASE_TABLET | Freq: Every day | ORAL | Status: DC
  Administered 2019-05-12: 22:00:00 1000 mg via ORAL
  Filled 2019-05-12: qty 4

## 2019-05-12 NOTE — Progress Notes (Signed)
[place] for Coumadin Indication: h/o DVT  Allergies  Allergen Reactions  . Shellfish Allergy Anaphylaxis    Patient Measurements: Height: 5\' 11"  (180.3 cm) Weight: 218 lb 14.7 oz (99.3 kg) IBW/kg (Calculated) : 75.3  Vital Signs: Temp: 97.6 F (36.4 C) (07/21 0500) Temp Source: Oral (07/21 0500) BP: 122/79 (07/21 0451) Pulse Rate: 74 (07/21 0451)  Labs: Recent Labs    05/10/19 2345 05/11/19 0600 05/12/19 0347  HGB 14.7 13.9  --   HCT 46.0 42.7  --   PLT 222 213  --   APTT 32  --   --   LABPROT 16.6*  --  26.0*  INR 1.4*  --  2.4*  CREATININE 1.25* 1.19  --     Estimated Creatinine Clearance: 75.3 mL/min (by C-G formula based on SCr of 1.19 mg/dL).    Assessment: 63 y.o. 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  Current home regimen 5 mg MWF, 2.5 mg TTSS with admit INR of 1.4  INR today up to 2.4  Goal of Therapy:  INR 2-3 Monitor platelets by anticoagulation protocol: Yes   Plan:  Hold warfarin today given jump in INR Daily INR  Thank you Anette Guarneri, PharmD [phone] 05/12/2019,10:08 AM

## 2019-05-12 NOTE — Telephone Encounter (Signed)
The patient went back into the ER today with a seizure, Depakote level is subtherapeutic at 36, patient is on 500 mg twice daily, he claims he is not missing doses.  Will increase the Depakote to 500 mg in the morning and 1000 mg in the evening, I will call him in 3 weeks to come in to have blood work checked to ensure that the levels are therapeutic.

## 2019-05-12 NOTE — Progress Notes (Signed)
PROGRESS NOTE    Russell Carter  ZOX:[id number] DOB: 04/22/1956 DOA: 05/10/2019 PCP: [name], [name], MD     Brief Narrative:  Russell Carter a 63 y.o.malewithhistory of seizure, hypertension, pulmonary embolism on Coumadin, polysubstance abuse, chronic kidney disease stage II, nonobstructive CAD, chronic pain was brought to the ER after patient was found to have 4 witnessed seizures back-to-back lasting for less than 2 minutes within a 1 hour. Patient has stated to ER physician that he has been feeling dizzy and weak last few days had some diarrhea. In the ER, patient had 2 more seizures lasting for less than 2 minutes one was lasting half a minute. Patient was given Ativan followed by Keppra IV 1 g. Following which Depakote levels came at around 36 for which patient was loaded with Depakote. Neurology was consulted. CT head CT C-spine L-spine T-spine were unremarkable.   New events last 24 hours / Subjective: Feeling okay this morning, states that he feels better than yesterday but very weak  Assessment & Plan:   Principal Problem:   Seizure (HCC) Active Problems:   Essential hypertension   Polysubstance abuse (HCC)   CAD-50% LAD 2011   Diabetes mellitus type 2 in nonobese (HCC)   History of pulmonary embolus (PE)   CKD (chronic kidney disease), stage III (HCC)   Seizure in setting of subtherapeutic Depakote -Appreciate neurology -CT head: No acute intracranial abnormality -EEG: Negative -Depakote dose increased -Patient to follow-up with Dr. Anne HahnWillis and have Depakote levels drawn at that time -Seizure precaution  Generalized weakness -PT OT to evaluate  SIRS -No source of infection found -CXR negative. UA unremarkable. Blood cultures pending. No leukocytosis, afebrile, lactic acidosis resolved. Stop antibiotics and watch fever curve  -Blood culture negative to date -Urine culture negative -COVID-19 negative  History of PE -Continue Coumadin per pharmacy  dosing  Essential hypertension -Continue Norvasc, Cozaar  Depression -Continue Cymbalta  Hyperlipidemia -Continue Crestor   DVT prophylaxis: Coumadin Code Status: Full code Family Communication: None Disposition Plan: Pending PT OT evaluation   Consultants:   Neurology  Procedures:   EEG  Antimicrobials:  Anti-infectives (From admission, onward)   Start     Dose/Rate Route Frequency Ordered Stop   05/11/19 1200  ceFEPIme (MAXIPIME) 2 g in sodium chloride 0.9 % 100 mL IVPB  Status:  Discontinued     2 g 200 mL/hr over 30 Minutes Intravenous Every 8 hours 05/11/19 0740 05/11/19 0943   05/11/19 1000  vancomycin (VANCOCIN) 1,250 mg in sodium chloride 0.9 % 250 mL IVPB  Status:  Discontinued     1,250 mg 166.7 mL/hr over 90 Minutes Intravenous Every 12 hours 05/11/19 0740 05/11/19 0943   05/11/19 0100  ceFEPIme (MAXIPIME) 2 g in sodium chloride 0.9 % 100 mL IVPB     2 g 200 mL/hr over 30 Minutes Intravenous  Once 05/11/19 0056 05/11/19 0502   05/11/19 0100  metroNIDAZOLE (FLAGYL) IVPB 500 mg     500 mg 100 mL/hr over 60 Minutes Intravenous  Once 05/11/19 0056 05/11/19 0502   05/11/19 0100  vancomycin (VANCOCIN) IVPB 1000 mg/200 mL premix  Status:  Discontinued     1,000 mg 200 mL/hr over 60 Minutes Intravenous  Once 05/11/19 0056 05/11/19 0059   05/11/19 0100  vancomycin (VANCOCIN) 1,500 mg in sodium chloride 0.9 % 500 mL IVPB     1,500 mg 250 mL/hr over 120 Minutes Intravenous  Once 05/11/19 0059 05/11/19 0502        Objective: Vitals:  05/12/19 0051 05/12/19 0451 05/12/19 0500 05/12/19 1332  BP: 121/81 122/79    Pulse: 72 74    Resp: 14 13    Temp:   97.6 F (36.4 C) 98.7 F (37.1 C)  TempSrc:   Oral Oral  SpO2: 98% 98%    Weight:   99.3 kg   Height:        Intake/Output Summary (Last 24 hours) at 05/12/2019 1414 Last data filed at 05/12/2019 0850 Gross per 24 hour  Intake 1580.5 ml  Output 675 ml  Net 905.5 ml   Filed Weights   05/10/19 2229  05/12/19 0500  Weight: 92.1 kg 99.3 kg    Examination:  General exam: Appears calm and comfortable  Respiratory system: Clear to auscultation. Respiratory effort normal. Cardiovascular system: S1 & S2 heard, RRR. No JVD, murmurs, rubs, gallops or clicks. No pedal edema. Gastrointestinal system: Abdomen is nondistended, soft and nontender. No organomegaly or masses felt. Normal bowel sounds heard. Central nervous system: Alert and oriented. No focal neurological deficits. Extremities: Symmetric 5 x 5 power. Skin: No rashes, lesions or ulcers Psychiatry: Stable  Data Reviewed: I have personally reviewed following labs and imaging studies  CBC: Recent Labs  Lab 05/10/19 2345 05/11/19 0600  WBC 8.6 7.7  NEUTROABS 6.1 4.8  HGB 14.7 13.9  HCT 46.0 42.7  MCV 91.8 89.5  PLT 222 784   Basic Metabolic Panel: Recent Labs  Lab 05/10/19 2345 05/11/19 0600  NA 136 139  K 3.9 3.9  CL 106 109  CO2 19* 21*  GLUCOSE 143* 121*  BUN 11 12  CREATININE 1.25* 1.19  CALCIUM 9.1 8.4*   GFR: Estimated Creatinine Clearance: 75.3 mL/min (by C-G formula based on SCr of 1.19 mg/dL). Liver Function Tests: Recent Labs  Lab 05/10/19 2345 05/11/19 0600  AST 21 18  ALT 15 14  ALKPHOS 46 45  BILITOT 0.3 0.5  PROT 6.6 6.3*  ALBUMIN 3.5 3.2*   No results for input(s): LIPASE, AMYLASE in the last 168 hours. No results for input(s): AMMONIA in the last 168 hours. Coagulation Profile: Recent Labs  Lab 05/10/19 2345 05/12/19 0347  INR 1.4* 2.4*   Cardiac Enzymes: No results for input(s): CKTOTAL, CKMB, CKMBINDEX, TROPONINI in the last 168 hours. BNP (last 3 results) No results for input(s): PROBNP in the last 8760 hours. HbA1C: No results for input(s): HGBA1C in the last 72 hours. CBG: Recent Labs  Lab 05/10/19 2358 05/11/19 0442 05/12/19 0752  GLUCAP 216* 120* 106*   Lipid Profile: No results for input(s): CHOL, HDL, LDLCALC, TRIG, CHOLHDL, LDLDIRECT in the last 72  hours. Thyroid Function Tests: No results for input(s): TSH, T4TOTAL, FREET4, T3FREE, THYROIDAB in the last 72 hours. Anemia Panel: No results for input(s): VITAMINB12, FOLATE, FERRITIN, TIBC, IRON, RETICCTPCT in the last 72 hours. Sepsis Labs: Recent Labs  Lab 05/10/19 2345 05/11/19 0307 05/11/19 0615 05/11/19 0838  LATICACIDVEN 3.6* 1.7 1.7 1.4    Recent Results (from the past 240 hour(s))  Blood culture (routine x 2)     Status: None (Preliminary result)   Collection Time: 05/10/19 10:45 PM   Specimen: BLOOD  Result Value Ref Range Status   Specimen Description BLOOD LEFT HAND  Final   Special Requests   Final    BOTTLES DRAWN AEROBIC AND ANAEROBIC Blood Culture results may not be optimal due to an inadequate volume of blood received in culture bottles   Culture   Final    NO GROWTH 1 DAY Performed  at [facility], [address] [place], [place] [address]    Report Status PENDING  Incomplete  Blood culture (routine x 2)     Status: None (Preliminary result)   Collection Time: 05/10/19 11:45 PM   Specimen: BLOOD  Result Value Ref Range Status   Specimen Description BLOOD RIGHT HAND  Final   Special Requests   Final    BOTTLES DRAWN AEROBIC AND ANAEROBIC Blood Culture results may not be optimal due to an excessive volume of blood received in culture bottles   Culture   Final    NO GROWTH 1 DAY Performed at [facility], [address] [place], [place] [address]    Report Status PENDING  Incomplete  SARS Coronavirus 2 (CEPHEID - Performed in [facility] hospital lab), Hosp Order     Status: None   Collection Time: 05/11/19 12:05 AM   Specimen: Nasopharyngeal Swab  Result Value Ref Range Status   SARS Coronavirus 2 NEGATIVE NEGATIVE Final    Comment: (NOTE) If result is NEGATIVE SARS-CoV-2 target nucleic acids are NOT DETECTED. The SARS-CoV-2 RNA is generally detectable in upper and lower  respiratory specimens during the acute phase of infection. The  lowest  concentration of SARS-CoV-2 viral copies this assay can detect is 250  copies / mL. A negative result does not preclude SARS-CoV-2 infection  and should not be used as the sole basis for treatment or other  patient management decisions.  A negative result may occur with  improper specimen collection / handling, submission of specimen other  than nasopharyngeal swab, presence of viral mutation(s) within the  areas targeted by this assay, and inadequate number of viral copies  (<250 copies / mL). A negative result must be combined with clinical  observations, patient history, and epidemiological information. If result is POSITIVE SARS-CoV-2 target nucleic acids are DETECTED. The SARS-CoV-2 RNA is generally detectable in upper and lower  respiratory specimens dur ing the acute phase of infection.  Positive  results are indicative of active infection with SARS-CoV-2.  Clinical  correlation with patient history and other diagnostic information is  necessary to determine patient infection status.  Positive results do  not rule out bacterial infection or co-infection with other viruses. If result is PRESUMPTIVE POSTIVE SARS-CoV-2 nucleic acids MAY BE PRESENT.   A presumptive positive result was obtained on the submitted specimen  and confirmed on repeat testing.  While 2019 novel coronavirus  (SARS-CoV-2) nucleic acids may be present in the submitted sample  additional confirmatory testing may be necessary for epidemiological  and / or clinical management purposes  to differentiate between  SARS-CoV-2 and other Sarbecovirus currently known to infect humans.  If clinically indicated additional testing with an alternate test  methodology [phone] is advised. The SARS-CoV-2 RNA is generally  detectable in upper and lower respiratory sp ecimens during the acute  phase of infection. The expected result is Negative. Fact Sheet for Patients:   BoilerBrush.com.cyhttps://www.fda.gov/media/136312/download Fact Sheet for Healthcare Providers: https://pope.com/https://www.fda.gov/media/136313/download This test is not yet approved or cleared by the [place] FDA and has been authorized for detection and/or diagnosis of SARS-CoV-2 by FDA under an Emergency Use Authorization (EUA).  This EUA will remain in effect (meaning this test can be used) for the duration of the COVID-19 declaration under Section 564(b)(1) of the Act, 21 U.S.C. section 360bbb-3(b)(1), unless the authorization is terminated or revoked sooner. Performed at [facility], [address] [place], [place] [address]  Urine culture     Status: None   Collection Time: 05/11/19  1:07 AM   Specimen: In/Out Cath Urine  Result Value Ref Range Status   Specimen Description IN/OUT CATH URINE  Final   Special Requests NONE  Final   Culture   Final    NO GROWTH Performed at [facility], [address] [place], [place] [address]    Report Status 05/12/2019 FINAL  Final      Radiology Studies: Dg Thoracic Spine 2 View  Result Date: 05/11/2019 CLINICAL DATA:  Back pain EXAM: THORACIC SPINE 2 VIEWS COMPARISON:  None. FINDINGS: There is no evidence of thoracic spine fracture. Alignment is normal. No other significant bone abnormalities are identified. IMPRESSION: Negative. Electronically Signed   By: Deatra RobinsonKevin  Herman M.D.   On: 05/11/2019 01:11   Dg Lumbar Spine Complete  Result Date: 05/11/2019 CLINICAL DATA:  63 year old male with back pain. EXAM: LUMBAR SPINE - COMPLETE 4+ VIEW COMPARISON:  Abdominal radiograph dated 05/06/2018 and CT dated 04/30/2018 FINDINGS: There is no acute fracture or subluxation of the lumbar spine. The vertebral body heights and disc spaces are maintained. Multilevel facet arthropathy. Grade 1 L3-L4 retrolisthesis. The soft tissues are unremarkable. IMPRESSION: 1. No acute/traumatic lumbar spine pathology. 2. Degenerative changes. Electronically Signed   By: Elgie CollardArash   Radparvar M.D.   On: 05/11/2019 01:12   Ct Head Wo Contrast  Result Date: 05/11/2019 CLINICAL DATA:  Headache, post traumatic; C-spine fx, traumatic headache and neck pain. Seizure. EXAM: CT HEAD WITHOUT CONTRAST CT CERVICAL SPINE WITHOUT CONTRAST TECHNIQUE: Multidetector CT imaging of the head and cervical spine was performed following the standard protocol without intravenous contrast. Multiplanar CT image reconstructions of the cervical spine were also generated. COMPARISON:  Multiple prior exams most recently head and cervical spine CT 04/13/2019 FINDINGS: CT HEAD FINDINGS Brain: No intracranial hemorrhage, mass effect, or midline shift. Age related atrophy. Similar chronic small vessel ischemia to prior. No hydrocephalus. The basilar cisterns are patent. No evidence of territorial infarct or acute ischemia. No extra-axial or intracranial fluid collection. Vascular: No hyperdense vessel. Skull: No fracture or focal lesion. Sinuses/Orbits: Cichy mucosal thickening of ethmoid air cells. No sinus fluid levels. Mastoid air cells are clear. Orbits are unremarkable. Other: None. CT CERVICAL SPINE FINDINGS Alignment: Postsurgical straightening. No traumatic subluxation. Skull base and vertebrae: No acute fracture. Dens and skull base are intact. Posterior C3 through T1 fusion and laminectomy. Anterior C4 through C7 fusion. No acute fracture. The dens and skull base are intact. Streak artifact from surgical hardware. Soft tissues and spinal canal: No prevertebral soft tissue edema. Canal partially obscured by streak artifact. Disc levels: Anterior fusion with discectomy C4-C5 through C6-C7. Mild endplate spurring U0-A5C3-C4. Upper chest: No acute findings. Other: None. IMPRESSION: 1. No acute intracranial abnormality. No skull fracture. Mild chronic small vessel ischemia is unchanged from prior. 2. Postsurgical change in the cervical spine without acute fracture or subluxation. Electronically Signed   By: Narda RutherfordMelanie   Sanford M.D.   On: 05/11/2019 01:20   Ct Cervical Spine Wo Contrast  Result Date: 05/11/2019 CLINICAL DATA:  Headache, post traumatic; C-spine fx, traumatic headache and neck pain. Seizure. EXAM: CT HEAD WITHOUT CONTRAST CT CERVICAL SPINE WITHOUT CONTRAST TECHNIQUE: Multidetector CT imaging of the head and cervical spine was performed following the standard protocol without intravenous contrast. Multiplanar CT image reconstructions of the cervical spine were also generated. COMPARISON:  Multiple prior exams most recently head and cervical spine CT 04/13/2019 FINDINGS: CT HEAD FINDINGS  Brain: No intracranial hemorrhage, mass effect, or midline shift. Age related atrophy. Similar chronic small vessel ischemia to prior. No hydrocephalus. The basilar cisterns are patent. No evidence of territorial infarct or acute ischemia. No extra-axial or intracranial fluid collection. Vascular: No hyperdense vessel. Skull: No fracture or focal lesion. Sinuses/Orbits: Boehlke mucosal thickening of ethmoid air cells. No sinus fluid levels. Mastoid air cells are clear. Orbits are unremarkable. Other: None. CT CERVICAL SPINE FINDINGS Alignment: Postsurgical straightening. No traumatic subluxation. Skull base and vertebrae: No acute fracture. Dens and skull base are intact. Posterior C3 through T1 fusion and laminectomy. Anterior C4 through C7 fusion. No acute fracture. The dens and skull base are intact. Streak artifact from surgical hardware. Soft tissues and spinal canal: No prevertebral soft tissue edema. Canal partially obscured by streak artifact. Disc levels: Anterior fusion with discectomy C4-C5 through C6-C7. Mild endplate spurring Y6-Z9. Upper chest: No acute findings. Other: None. IMPRESSION: 1. No acute intracranial abnormality. No skull fracture. Mild chronic small vessel ischemia is unchanged from prior. 2. Postsurgical change in the cervical spine without acute fracture or subluxation. Electronically Signed   By: Narda Rutherford M.D.   On: 05/11/2019 01:20   Dg Chest Portable 1 View  Result Date: 05/11/2019 CLINICAL DATA:  63 year old male with cough EXAM: PORTABLE CHEST 1 VIEW COMPARISON:  Chest radiograph dated 04/09/2019 FINDINGS: Shallow inspiration. There is mild eventration of the left hemidiaphragm with associated subsegmental atelectasis. No focal consolidation, pleural effusion, or pneumothorax. The cardiac silhouette is within normal limits. No acute osseous pathology. Extensive cervical spine fixation hardware. IMPRESSION: No active disease. Electronically Signed   By: Elgie Collard M.D.   On: 05/11/2019 00:29      Scheduled Meds:  allopurinol  300 mg Oral Daily   amLODipine  5 mg Oral Daily   aspirin EC  81 mg Oral Daily   collagenase   Topical Daily   divalproex  1,000 mg Oral QHS   [START ON 05/13/2019] divalproex  500 mg Oral q morning - 10a   DULoxetine  30 mg Oral BID   folic acid  1 mg Oral Daily   losartan  100 mg Oral Daily   rosuvastatin  40 mg Oral Daily   Warfarin - Pharmacist Dosing Inpatient   Does not apply q1800   Continuous Infusions:   LOS: 1 day    Time spent: 25 minutes   Noralee Stain, DO [facility] [email or web address] 05/12/2019, 2:14 PM

## 2019-05-12 NOTE — Progress Notes (Signed)
NEUROLOGY PROGRESS NOTE  Subjective: Patient remains very tired and is complaining of a headache.  Desiring more pain medication for his headache.  Overall he also states that his vision is improving.  Exam: Vitals:   05/12/19 0500 05/12/19 1332  BP:    Pulse:    Resp:    Temp: 97.6 F (36.4 C) 98.7 F (37.1 C)  SpO2:      Physical Exam   HEENT-  Normocephalic, no lesions, without obvious abnormality.  Normal external eye and conjunctiva.   Extremities- Warm, dry and intact Musculoskeletal-no joint tenderness, deformity or swelling Skin-warm and dry, no hyperpigmentation, vitiligo, or suspicious lesions   Neuro:  Mental Status: Patient somnolent but still oriented x3, follows commands and does not drift off to sleep at this time. Cranial Nerves: II: Left hemianopsia III,IV, VI: ptosis not present, extra-ocular motions intact bilaterally pupils equal, round, reactive to light and accommodation V,VII: Slight decrease facial droop on the left facial light touch sensation decreased on the left VIII: hearing normal bilaterally  Motor: Right : Upper extremity   5/5    Left:     Upper extremity   4/5  Lower extremity   5/5     Lower extremity   4/5 he has difficult time secondary to pain Tone and bulk:normal tone throughout; no atrophy noted Sensory: Pinprick and light touch stated to be decreased on the left side Deep Tendon Reflexes: 2+ and symmetric throughout Plantars: Right: downgoing   Left: downgoing     Medications:  Scheduled: . allopurinol  300 mg Oral Daily  . amLODipine  5 mg Oral Daily  . aspirin EC  81 mg Oral Daily  . collagenase   Topical Daily  . divalproex  1,000 mg Oral QHS  . divalproex  500 mg Oral q morning - 10a  . DULoxetine  30 mg Oral BID  . folic acid  1 mg Oral Daily  . losartan  100 mg Oral Daily  . rosuvastatin  40 mg Oral Daily  . Warfarin - Pharmacist Dosing Inpatient   Does not apply q1800    Pertinent  Labs/Diagnostics: EEG-Clinical Interpretation: This normal EEG is recorded in the waking and sleep state. There was no seizure or seizure predisposition recorded on this study. Please note that lack of epileptiform activity on EEG does not preclude the possibility of epilepsy.    CT head-No acute intracranial abnormality. No skull fracture. Mild chronic small vessel ischemia is unchanged from prior.   Etta Quill PA-C [facility] [phone]   Assessment: Breakthrough seizure in the setting of subtherapeutic Depakote.  Currently he is taking Depakote 500 mg a day and states that he is compliant.  I have called Dr. Jannifer Franklin who is his primary neurologist, and he is recommended to increase Depakote to 500 mg in the a.m. and 1000 mg in the p.m.  Patient will follow-up with Dr. Jannifer Franklin and have Depakote levels and labs drawn at that time.   Recommendations: -500 mg Depakote in the a.m. with 1000 mg Depakote in the p.m. -Follow-up with Dr. Jannifer Franklin - Seizure precautions - PT -Per [facility] statutes, patients with seizures are not allowed to drive until  they have been seizure-free for six months. Use caution when using heavy equipment or power tools. Avoid working on ladders or at heights. Take showers instead of baths. Ensure the water temperature is not too high on the home water heater. Do not go swimming alone. When caring for infants or small children,  sit down when holding, feeding, or changing them to minimize risk of injury to the child in the event you have a seizure.   Also, Maintain good sleep hygiene. Avoid alcohol.     05/12/2019, 2:00 PM

## 2019-05-13 DIAGNOSIS — N183 Chronic kidney disease, stage 3 (moderate): Secondary | ICD-10-CM

## 2019-05-13 LAB — BASIC METABOLIC PANEL
Anion gap: 5 (ref 5–15)
BUN: 10 mg/dL (ref 8–23)
CO2: 23 mmol/L (ref 22–32)
Calcium: 8.2 mg/dL — ABNORMAL LOW (ref 8.9–10.3)
Chloride: 109 mmol/L (ref 98–111)
Creatinine, Ser: 1.06 mg/dL (ref 0.61–1.24)
GFR calc Af Amer: >60 mL/min (ref >60)
GFR calc non Af Amer: >60 mL/min (ref >60)
Glucose, Bld: 150 mg/dL — ABNORMAL HIGH (ref 70–99)
Potassium: 3.8 mmol/L (ref 3.5–5.1)
Sodium: 137 mmol/L (ref 135–145)

## 2019-05-13 LAB — AMMONIA: Ammonia: 70 umol/L — ABNORMAL HIGH (ref 9–35)

## 2019-05-13 LAB — CBC
HCT: 38.4 % — ABNORMAL LOW (ref 39.0–52.0)
Hemoglobin: 12.1 g/dL — ABNORMAL LOW (ref 13.0–17.0)
MCH: 28.9 pg (ref 26.0–34.0)
MCHC: 31.5 g/dL (ref 30.0–36.0)
MCV: 91.6 fL (ref 80.0–100.0)
Platelets: 184 10*3/uL (ref 150–400)
RBC: 4.19 MIL/uL — ABNORMAL LOW (ref 4.22–5.81)
RDW: 14.2 % (ref 11.5–15.5)
WBC: 4.1 10*3/uL (ref 4.0–10.5)
nRBC: 0 % (ref 0.0–0.2)

## 2019-05-13 LAB — PROTIME-INR
INR: 2.7 — ABNORMAL HIGH (ref 0.8–1.2)
Prothrombin Time: 28.4 seconds — ABNORMAL HIGH (ref 11.4–15.2)

## 2019-05-13 LAB — VALPROIC ACID LEVEL: Valproic Acid Lvl: 78 ug/mL (ref 50.0–100.0)

## 2019-05-13 MED ORDER — SODIUM CHLORIDE 0.9 % IV SOLN
100.0000 mg | Freq: Two times a day (BID) | INTRAVENOUS | Status: DC
  Administered 2019-05-13 – 2019-05-14 (×3): 100 mg via INTRAVENOUS
  Filled 2019-05-13 (×5): qty 10

## 2019-05-13 MED ORDER — TRAZODONE HCL 50 MG PO TABS: 50.0000 mg | ORAL_TABLET | Freq: Every evening | ORAL | Status: DC | PRN

## 2019-05-13 NOTE — Progress Notes (Signed)
Occupational Therapy Evaluation Patient Details Name: Russell Carter MRN: [id number] DOB: 06/21/1956 Today's Date: 05/13/2019    History of Present Illness Russell Carter is a 63 y.o. male with history of seizure, hypertension, pulmonary embolism on Coumadin, polysubstance abuse, chronic kidney disease stage II, nonobstructive CAD, chronic pain was brought to the ER after patient was found to have 4 witnessed seizures back-to-back lasting for less than 2 minutes within a 1 hour.  Patient has stated to ER physician that he has been feeling dizzy and weak last few days had some diarrhea. In the ER, patient had 2 more seizures lasting for less than 2 minutes one was lasting half a minute.CT head CT C-spine L-spine T-spine were unremarkable.     Clinical Impression   PTA, pt was living at home with his fiance, pt reports he was independent with ADL/IADL and functional mobility with use of a cane. Pt currently requires minguard-minA with ADL and functional mobility at RW level. Pt demonstrates L hemianopsia, educated pt on compensatory visual strategies. Due to decline in current level of function, pt would benefit from acute OT to address established goals to facilitate safe D/C to venue listed below. At this time, recommend HHOT follow-up. Will continue to follow acutely.     Follow Up Recommendations  Home health OT;Supervision - Intermittent(with OOB mobility)    Equipment Recommendations  None recommended by OT    Recommendations for Other Services PT consult     Precautions / Restrictions Precautions Precautions: Fall;Other (comment)(seizure) Restrictions Weight Bearing Restrictions: No      Mobility Bed Mobility Overal bed mobility: Needs Assistance Bed Mobility: Supine to Sit;Sit to Supine     Supine to sit: Min guard Sit to supine: Supervision   General bed mobility comments: minguard progressing to EOB, pt with reports of dizziness  Transfers Overall transfer level: Needs  assistance Equipment used: Rolling walker (2 wheeled) Transfers: Sit to/from Stand Sit to Stand: Min guard;Min assist         General transfer comment: minA for stability;pt reports dizziness with movement    Balance Overall balance assessment: Needs assistance Sitting-balance support: No upper extremity supported;Feet supported Sitting balance-Leahy Scale: Fair Sitting balance - Comments: reports dizziness no LOB   Standing balance support: Bilateral upper extremity supported Standing balance-Leahy Scale: Poor Standing balance comment: reliant on BUE support                           ADL either performed or assessed with clinical judgement   ADL Overall ADL's : Needs assistance/impaired Eating/Feeding: Set up;Sitting   Grooming: Min guard;Standing   Upper Body Bathing: Min guard;Sitting   Lower Body Bathing: Min guard;Sit to/from stand   Upper Body Dressing : Min guard;Sitting   Lower Body Dressing: Min guard;Sit to/from stand   Toilet Transfer: Min guard;Minimal assistance;Ambulation;RW Toilet Transfer Details (indicate cue type and reason): simulated from/return to EOB Toileting- Clothing Manipulation and Hygiene: Min guard       Functional mobility during ADLs: Minimal assistance;Min guard;Rolling walker General ADL Comments: pt with reports of increased dizziness with standing, subsided and improved with occluded L eye;pt reports difficulty eating full meals due to L hemianopsia, educate pt on compensatory strategies for hemianopsia      Vision Baseline Vision/History: Wears glasses Wears Glasses: At all times Patient Visual Report: Blurring of vision Vision Assessment?: Yes Eye Alignment: Within Functional Limits Ocular Range of Motion: Restricted looking down;Restricted on the left Alignment/Gaze Preference: Within  Defined Limits Tracking/Visual Pursuits: Decreased smoothness of horizontal tracking;Decreased smoothness of vertical  tracking;Decreased smoothness of eye movement to LEFT superior field;Decreased smoothness of eye movement to LEFT inferior field Visual Fields: Left homonymous hemianopsia Depth Perception: Undershoots Additional Comments: discussed compensatory strategies for L hemianopsia      Perception     Praxis      Pertinent Vitals/Pain Pain Assessment: 0-10 Pain Score: 5  Pain Location: neck and shoulders Pain Descriptors / Indicators: Sore Pain Intervention(s): Limited activity within patient's tolerance;Monitored during session     Hand Dominance Right   Extremity/Trunk Assessment Upper Extremity Assessment Upper Extremity Assessment: Overall WFL for tasks assessed   Lower Extremity Assessment Lower Extremity Assessment: Generalized weakness   Cervical / Trunk Assessment Cervical / Trunk Assessment: Normal   Communication Communication Communication: No difficulties   Cognition Arousal/Alertness: Lethargic Behavior During Therapy: Flat affect Overall Cognitive Status: Within Functional Limits for tasks assessed                                     General Comments  VSS throughout;pt reports dizziness but states it is at all times, dizziness lessens with L eye occluded     Exercises     Shoulder Instructions      Home Living Family/patient expects to be discharged to:: Private residence Living Arrangements: Spouse/significant other Available Help at Discharge: Available 24 hours/day;Family Type of Home: House Home Access: Stairs to enter Secretary/administratorntrance Stairs-Number of Steps: 3 Entrance Stairs-Rails: Right;Left Home Layout: One level     Bathroom Shower/Tub: Chief Strategy OfficerTub/shower unit   Bathroom Toilet: Standard Bathroom Accessibility: No   Home Equipment: Cane - single point   Additional Comments: wears glasses at all times, does not have them with him      Prior Functioning/Environment Level of Independence: Independent with assistive device(s)         Comments: cane for walking        OT Problem List: Decreased activity tolerance;Impaired balance (sitting and/or standing);Impaired vision/perception;Decreased safety awareness;Pain      OT Treatment/Interventions: Self-care/ADL training;DME and/or AE instruction;Visual/perceptual remediation/compensation;Patient/family education;Balance training    OT Goals(Current goals can be found in the care plan section) Acute Rehab OT Goals Patient Stated Goal: for seizures to stop occurring OT Goal Formulation: With patient Time For Goal Achievement: 05/27/19 Potential to Achieve Goals: Good ADL Goals Pt Will Perform Grooming: with modified independence Pt Will Perform Upper Body Dressing: with modified independence Pt Will Perform Lower Body Dressing: with modified independence;sit to/from stand Pt Will Transfer to Toilet: with modified independence;ambulating Additional ADL Goal #1: Pt will demonstrate independence with visual compensatory strategies.  OT Frequency: Min 2X/week   Barriers to D/C:            Co-evaluation              AM-PAC OT "6 Clicks" Daily Activity     Outcome Measure Help from another person eating meals?: A Little Help from another person taking care of personal grooming?: A Little Help from another person toileting, which includes using toliet, bedpan, or urinal?: A Little Help from another person bathing (including washing, rinsing, drying)?: A Little Help from another person to put on and taking off regular upper body clothing?: A Little Help from another person to put on and taking off regular lower body clothing?: A Little 6 Click Score: 18   End of Session Equipment Utilized During Treatment: Gait  belt;Rolling walker Nurse Communication: Mobility status  Activity Tolerance: Patient tolerated treatment well;Patient limited by fatigue Patient left: in bed;with call bell/phone within reach;with bed alarm set  OT Visit Diagnosis: Other  abnormalities of gait and mobility (R26.89);Pain;Low vision, both eyes (H54.2) Pain - part of body: (neck and shoulder)                Time: 1030-1055 OT Time Calculation (min): 25 min Charges:  OT General Charges $OT Visit: 1 Visit OT Evaluation $OT Eval Moderate Complexity: 1 Mod OT Treatments $Self Care/Home Management : 8-22 mins  Dorinda Hill OTR/L Acute Rehabilitation Services Office: [place] 05/13/2019, 11:35 AM

## 2019-05-13 NOTE — Progress Notes (Signed)
[place] for Coumadin Indication: h/o DVT  Allergies  Allergen Reactions  . Shellfish Allergy Anaphylaxis    Patient Measurements: Height: 5\' 11"  (180.3 cm) Weight: 225 lb 8.5 oz (102.3 kg) IBW/kg (Calculated) : 75.3  Vital Signs: Temp: 98 F (36.7 C) (07/22 0430) Temp Source: Oral (07/22 0430) BP: 119/71 (07/22 0430) Pulse Rate: 70 (07/22 0430)  Labs: Recent Labs    05/10/19 2345 05/11/19 0600 05/12/19 0347 05/13/19 0325  HGB 14.7 13.9  --  12.1*  HCT 46.0 42.7  --  38.4*  PLT 222 213  --  184  APTT 32  --   --   --   LABPROT 16.6*  --  26.0* 28.4*  INR 1.4*  --  2.4* 2.7*  CREATININE 1.25* 1.19  --  1.06    Estimated Creatinine Clearance: 85.7 mL/min (by C-G formula based on SCr of 1.06 mg/dL).    Assessment: 63 y.o. 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  Current home regimen 5 mg MWF, 2.5 mg TTSS with admit INR of 1.4  INR today up to 2.7  Goal of Therapy:  INR 2-3 Monitor platelets by anticoagulation protocol: Yes   Plan:  Hold warfarin today given jump in INR Daily INR  Lorel Monaco, PharmD PGY1 Ambulatory Care Resident [facility] # [phone]

## 2019-05-13 NOTE — Progress Notes (Signed)
PROGRESS NOTE    Russell Carter  WUJ:[id number] DOB: 29-Apr-1956 DOA: 05/10/2019 PCP: [name], [name], MD     Brief Narrative:  Russell FlakeMichael Minoris a 63 y.o.malewithhistory of seizure, hypertension, pulmonary embolism on Coumadin, polysubstance abuse, chronic kidney disease stage II, nonobstructive CAD, chronic pain was brought to the ER after patient was found to have 4 witnessed seizures back-to-back lasting for less than 2 minutes within a 1 hour. Patient has stated to ER physician that he has been feeling dizzy and weak last few days had some diarrhea. In the ER, patient had 2 more seizures lasting for less than 2 minutes one was lasting half a minute. Patient was given Ativan followed by Keppra IV 1 g. Following which Depakote levels came at around 36 for which patient was loaded with Depakote. Neurology was consulted. CT head CT C-spine L-spine T-spine were unremarkable.   Subjective: -Feels sleepy and tired, headache last night, per nursing was sleeping all day yesterday  Assessment & Plan:   Multiple breakthrough seizures -In setting of subtherapeutic Depakote dose, unclear if he was completely compliant although patient admits to compliance  -CT head: No acute intracranial abnormality -EEG: Negative -Depakote dose increased to 1000 mg nightly and 500 every morning, unfortunately remains somnolent, drowsy. -Also on review of home medicines supposed to be on gabapentin 600 mg 4 times daily which he is not getting currently, I am concerned about restarting this given patient's level of drowsiness, d/w Neurology will defer to Neuro -Seizure precaution -PT OT evaluation  Generalized weakness -PT OT to evaluate -Likely from polypharmacy  SIRS, temp of 100 on initial evaluation in the ER -Likely from seizures, infectious work-up negative, chest x-ray unremarkable, UA benign, urine cultures negative, blood cultures negative, he remains afebrile, without leukocytosis -COVID-19 PCR  negative  History of PE -Continue Coumadin per pharmacy dosing  Essential hypertension -Continue Norvasc, Cozaar  Depression -Continue Cymbalta  Hyperlipidemia -Continue Crestor   DVT prophylaxis: Coumadin Code Status: Full code Family Communication: none at bedside Disposition Plan: Pending improvement in mentation, drowsiness   Consultants:   Neurology  Procedures:   EEG  Antimicrobials:  Anti-infectives (From admission, onward)   Start     Dose/Rate Route Frequency Ordered Stop   05/11/19 1200  ceFEPIme (MAXIPIME) 2 g in sodium chloride 0.9 % 100 mL IVPB  Status:  Discontinued     2 g 200 mL/hr over 30 Minutes Intravenous Every 8 hours 05/11/19 0740 05/11/19 0943   05/11/19 1000  vancomycin (VANCOCIN) 1,250 mg in sodium chloride 0.9 % 250 mL IVPB  Status:  Discontinued     1,250 mg 166.7 mL/hr over 90 Minutes Intravenous Every 12 hours 05/11/19 0740 05/11/19 0943   05/11/19 0100  ceFEPIme (MAXIPIME) 2 g in sodium chloride 0.9 % 100 mL IVPB     2 g 200 mL/hr over 30 Minutes Intravenous  Once 05/11/19 0056 05/11/19 0502   05/11/19 0100  metroNIDAZOLE (FLAGYL) IVPB 500 mg     500 mg 100 mL/hr over 60 Minutes Intravenous  Once 05/11/19 0056 05/11/19 0502   05/11/19 0100  vancomycin (VANCOCIN) IVPB 1000 mg/200 mL premix  Status:  Discontinued     1,000 mg 200 mL/hr over 60 Minutes Intravenous  Once 05/11/19 0056 05/11/19 0059   05/11/19 0100  vancomycin (VANCOCIN) 1,500 mg in sodium chloride 0.9 % 500 mL IVPB     1,500 mg 250 mL/hr over 120 Minutes Intravenous  Once 05/11/19 0059 05/11/19 0502       Objective:  Vitals:   05/12/19 1454 05/12/19 2130 05/13/19 0430 05/13/19 0432  BP:  125/79 119/71   Pulse:  84 70   Resp:      Temp: 98.3 F (36.8 C) 98.3 F (36.8 C) 98 F (36.7 C)   TempSrc: Oral Oral Oral   SpO2:  99% 97%   Weight:    102.3 kg  Height:        Intake/Output Summary (Last 24 hours) at 05/13/2019 1126 Last data filed at 05/13/2019 0941  Gross per 24 hour  Intake 480 ml  Output 1650 ml  Net -1170 ml   Filed Weights   05/10/19 2229 05/12/19 0500 05/13/19 0432  Weight: 92.1 kg 99.3 kg 102.3 kg    Examination:  Gen: Somnolent, easily arousable, oriented to self and place, partly to time HEENT: PERRLA, Neck supple, no JVD Lungs: Poor air movement bilaterally CVS: RRR,No Gallops,Rubs or new Murmurs Abd: soft, Non tender, non distended, BS present Extremities: No edema Skin: no new rashes Neuro, easily arousable but somnolent, moves all extremities, no localizing signs  Data Reviewed: I have personally reviewed following labs and imaging studies  CBC: Recent Labs  Lab 05/10/19 2345 05/11/19 0600 05/13/19 0325  WBC 8.6 7.7 4.1  NEUTROABS 6.1 4.8  --   HGB 14.7 13.9 12.1*  HCT 46.0 42.7 38.4*  MCV 91.8 89.5 91.6  PLT 222 213 973   Basic Metabolic Panel: Recent Labs  Lab 05/10/19 2345 05/11/19 0600 05/13/19 0325  NA 136 139 137  K 3.9 3.9 3.8  CL 106 109 109  CO2 19* 21* 23  GLUCOSE 143* 121* 150*  BUN 11 12 10   CREATININE 1.25* 1.19 1.06  CALCIUM 9.1 8.4* 8.2*   GFR: Estimated Creatinine Clearance: 85.7 mL/min (by C-G formula based on SCr of 1.06 mg/dL). Liver Function Tests: Recent Labs  Lab 05/10/19 2345 05/11/19 0600  AST 21 18  ALT 15 14  ALKPHOS 46 45  BILITOT 0.3 0.5  PROT 6.6 6.3*  ALBUMIN 3.5 3.2*   No results for input(s): LIPASE, AMYLASE in the last 168 hours. No results for input(s): AMMONIA in the last 168 hours. Coagulation Profile: Recent Labs  Lab 05/10/19 2345 05/12/19 0347 05/13/19 0325  INR 1.4* 2.4* 2.7*   Cardiac Enzymes: No results for input(s): CKTOTAL, CKMB, CKMBINDEX, TROPONINI in the last 168 hours. BNP (last 3 results) No results for input(s): PROBNP in the last 8760 hours. HbA1C: No results for input(s): HGBA1C in the last 72 hours. CBG: Recent Labs  Lab 05/10/19 2358 05/11/19 0442 05/12/19 0752  GLUCAP 216* 120* 106*   Lipid Profile: No  results for input(s): CHOL, HDL, LDLCALC, TRIG, CHOLHDL, LDLDIRECT in the last 72 hours. Thyroid Function Tests: No results for input(s): TSH, T4TOTAL, FREET4, T3FREE, THYROIDAB in the last 72 hours. Anemia Panel: No results for input(s): VITAMINB12, FOLATE, FERRITIN, TIBC, IRON, RETICCTPCT in the last 72 hours. Sepsis Labs: Recent Labs  Lab 05/10/19 2345 05/11/19 0307 05/11/19 0615 05/11/19 0838  LATICACIDVEN 3.6* 1.7 1.7 1.4    Recent Results (from the past 240 hour(s))  Blood culture (routine x 2)     Status: None (Preliminary result)   Collection Time: 05/10/19 10:45 PM   Specimen: BLOOD  Result Value Ref Range Status   Specimen Description BLOOD LEFT HAND  Final   Special Requests   Final    BOTTLES DRAWN AEROBIC AND ANAEROBIC Blood Culture results may not be optimal due to an inadequate volume of blood received in culture  bottles   Culture   Final    NO GROWTH 1 DAY Performed at [facility], [address] [place], [place] [address]    Report Status PENDING  Incomplete  Blood culture (routine x 2)     Status: None (Preliminary result)   Collection Time: 05/10/19 11:45 PM   Specimen: BLOOD  Result Value Ref Range Status   Specimen Description BLOOD RIGHT HAND  Final   Special Requests   Final    BOTTLES DRAWN AEROBIC AND ANAEROBIC Blood Culture results may not be optimal due to an excessive volume of blood received in culture bottles   Culture   Final    NO GROWTH 1 DAY Performed at [facility], [address] [place], [place] [address]    Report Status PENDING  Incomplete  SARS Coronavirus 2 (CEPHEID - Performed in [facility] hospital lab), Hosp Order     Status: None   Collection Time: 05/11/19 12:05 AM   Specimen: Nasopharyngeal Swab  Result Value Ref Range Status   SARS Coronavirus 2 NEGATIVE NEGATIVE Final    Comment: (NOTE) If result is NEGATIVE SARS-CoV-2 target nucleic acids are NOT DETECTED. The SARS-CoV-2 RNA is generally detectable in  upper and lower  respiratory specimens during the acute phase of infection. The lowest  concentration of SARS-CoV-2 viral copies this assay can detect is 250  copies / mL. A negative result does not preclude SARS-CoV-2 infection  and should not be used as the sole basis for treatment or other  patient management decisions.  A negative result may occur with  improper specimen collection / handling, submission of specimen other  than nasopharyngeal swab, presence of viral mutation(s) within the  areas targeted by this assay, and inadequate number of viral copies  (<250 copies / mL). A negative result must be combined with clinical  observations, patient history, and epidemiological information. If result is POSITIVE SARS-CoV-2 target nucleic acids are DETECTED. The SARS-CoV-2 RNA is generally detectable in upper and lower  respiratory specimens dur ing the acute phase of infection.  Positive  results are indicative of active infection with SARS-CoV-2.  Clinical  correlation with patient history and other diagnostic information is  necessary to determine patient infection status.  Positive results do  not rule out bacterial infection or co-infection with other viruses. If result is PRESUMPTIVE POSTIVE SARS-CoV-2 nucleic acids MAY BE PRESENT.   A presumptive positive result was obtained on the submitted specimen  and confirmed on repeat testing.  While 2019 novel coronavirus  (SARS-CoV-2) nucleic acids may be present in the submitted sample  additional confirmatory testing may be necessary for epidemiological  and / or clinical management purposes  to differentiate between  SARS-CoV-2 and other Sarbecovirus currently known to infect humans.  If clinically indicated additional testing with an alternate test  methodology [phone]([id number]) is advised. The SARS-CoV-2 RNA is generally  detectable in upper and lower respiratory sp ecimens during the acute  phase of infection. The expected result is  Negative. Fact Sheet for Patients:  BoilerBrush.com.cyhttps://www.fda.gov/media/136312/download Fact Sheet for Healthcare Providers: https://pope.com/https://www.fda.gov/media/136313/download This test is not yet approved or cleared by the [place] FDA and has been authorized for detection and/or diagnosis of SARS-CoV-2 by FDA under an Emergency Use Authorization (EUA).  This EUA will remain in effect (meaning this test can be used) for the duration of the COVID-19 declaration under Section 564(b)(1) of the Act, 21 U.S.C. section 360bbb-3(b)(1), unless the authorization is terminated or revoked sooner.  Performed at [facility], [address], [place], [place] [address]   Urine culture     Status: None   Collection Time: 05/11/19  1:07 AM   Specimen: In/Out Cath Urine  Result Value Ref Range Status   Specimen Description IN/OUT CATH URINE  Final   Special Requests NONE  Final   Culture   Final    NO GROWTH Performed at [facility], [address] [place], [place] [address]    Report Status 05/12/2019 FINAL  Final      Radiology Studies: No results found.    Scheduled Meds: . allopurinol  300 mg Oral Daily  . amLODipine  5 mg Oral Daily  . aspirin EC  81 mg Oral Daily  . collagenase   Topical Daily  . divalproex  1,000 mg Oral QHS  . divalproex  500 mg Oral q morning - 10a  . DULoxetine  30 mg Oral BID  . folic acid  1 mg Oral Daily  . losartan  100 mg Oral Daily  . rosuvastatin  40 mg Oral Daily  . Warfarin - Pharmacist Dosing Inpatient   Does not apply q1800   Continuous Infusions:   LOS: 2 days    Time spent: 25 minutes   Zannie Cove, MD [facility] [email or web address] 05/13/2019, 11:26 AM

## 2019-05-13 NOTE — Evaluation (Signed)
Physical Therapy Evaluation Patient Details Name: Russell Carter MRN: [id number] DOB: 1956/08/02 Today's Date: 05/13/2019   History of Present Illness  Russell Carter is a 63 y.o. male with history of seizure, hypertension, pulmonary embolism on Coumadin, polysubstance abuse, chronic kidney disease stage II, nonobstructive CAD, chronic pain was brought to the ER after patient was found to have 4 witnessed seizures back-to-back lasting for less than 2 minutes within a 1 hour.  Patient has stated to ER physician that he has been feeling dizzy and weak last few days had some diarrhea. In the ER, patient had 2 more seizures lasting for less than 2 minutes one was lasting half a minute.CT head CT C-spine L-spine T-spine were unremarkable.      Clinical Impression  Pt admitted with above diagnosis. Pt currently with functional limitations due to the deficits listed below (see PT Problem List). PTA pt reports independence at home. Today AOx4, but at times with slow processing. Ambulating hallway with and without RW, at this time safer with due to mild unsteadiness. rec HHPT to improve safety with balance at home.  Pt will benefit from skilled PT to increase their independence and safety with mobility to allow discharge to the venue listed below.        Follow Up Recommendations Home health PT;Supervision for mobility/OOB    Equipment Recommendations  None recommended by PT    Recommendations for Other Services       Precautions / Restrictions Precautions Precautions: Fall;Other (comment)(seizure) Restrictions Weight Bearing Restrictions: No      Mobility  Bed Mobility Overal bed mobility: Needs Assistance Bed Mobility: Supine to Sit;Sit to Supine     Supine to sit: Min guard Sit to supine: Supervision   General bed mobility comments: minguard progressing to EOB, pt with reports of dizziness  Transfers Overall transfer level: Needs assistance Equipment used: Rolling walker (2  wheeled) Transfers: Sit to/from Stand Sit to Stand: Min guard;Min assist         General transfer comment: minA for stability;pt reports dizziness with movement  Ambulation/Gait Ambulation/Gait assistance: Supervision Gait Distance (Feet): 120 Feet Assistive device: Rolling walker (2 wheeled);None Gait Pattern/deviations: Step-to pattern;Step-through pattern Gait velocity: decreased   General Gait Details: ambulated with and without RW, safer with RW at this time. short step length and mild unsteadiness noted. c/o of R hip pain towards end of session   Stairs            Wheelchair Mobility    Modified Rankin (Stroke Patients Only)       Balance Overall balance assessment: Needs assistance Sitting-balance support: No upper extremity supported;Feet supported Sitting balance-Leahy Scale: Fair Sitting balance - Comments: reports dizziness no LOB   Standing balance support: Bilateral upper extremity supported Standing balance-Leahy Scale: Poor Standing balance comment: reliant on BUE support                             Pertinent Vitals/Pain Pain Assessment: 0-10 Pain Score: 5  Pain Location: neck and shoulders Pain Descriptors / Indicators: Sore    Home Living Family/patient expects to be discharged to:: Private residence Living Arrangements: Spouse/significant other Available Help at Discharge: Available 24 hours/day;Family Type of Home: House Home Access: Stairs to enter Entrance Stairs-Rails: Doctor, general practice of Steps: 3 Home Layout: One level Home Equipment: Cane - single point Additional Comments: wears glasses at all times, does not have them with him    Prior Function Level  of Independence: Independent with assistive device(s)         Comments: cane for walking     Hand Dominance   Dominant Hand: Right    Extremity/Trunk Assessment   Upper Extremity Assessment Upper Extremity Assessment: Overall WFL for tasks  assessed    Lower Extremity Assessment Lower Extremity Assessment: Generalized weakness    Cervical / Trunk Assessment Cervical / Trunk Assessment: Normal  Communication   Communication: No difficulties  Cognition Arousal/Alertness: Lethargic Behavior During Therapy: Flat affect Overall Cognitive Status: Within Functional Limits for tasks assessed                                        General Comments      Exercises     Assessment/Plan    PT Assessment Patient needs continued PT services  PT Problem List Decreased strength       PT Treatment Interventions DME instruction;Gait training;Stair training;Functional mobility training;Therapeutic exercise;Therapeutic activities    PT Goals (Current goals can be found in the Care Plan section)  Acute Rehab PT Goals Patient Stated Goal: for seizures to stop occurring Time For Goal Achievement: 05/27/19 Potential to Achieve Goals: Good    Frequency Min 3X/week   Barriers to discharge        Co-evaluation               AM-PAC PT "6 Clicks" Mobility  Outcome Measure Help needed turning from your back to your side while in a flat bed without using bedrails?: A Little Help needed moving from lying on your back to sitting on the side of a flat bed without using bedrails?: A Little Help needed moving to and from a bed to a chair (including a wheelchair)?: A Little Help needed standing up from a chair using your arms (e.g., wheelchair or bedside chair)?: A Little Help needed to walk in hospital room?: A Lot Help needed climbing 3-5 steps with a railing? : A Lot 6 Click Score: 16    End of Session Equipment Utilized During Treatment: Gait belt Activity Tolerance: Patient tolerated treatment well Patient left: in bed Nurse Communication: Mobility status PT Visit Diagnosis: Unsteadiness on feet (R26.81)    Time: 1845-1920 PT Time Calculation (min) (ACUTE ONLY): 35 min   Charges:   PT  Evaluation $PT Eval Moderate Complexity: 1 Mod PT Treatments $Gait Training: 8-22 mins       Reinaldo Berber, PT, DPT Acute Rehabilitation Services Pager: [phone] Office: [place] 05/13/2019, 7:30 PM

## 2019-05-13 NOTE — Progress Notes (Signed)
Reason for consult: Seizures, worsening encephalopathy  Subjective: Patient was lethargic today morning, however is improved.  ROS: negative except above  Examination  Vital signs in last 24 hours: Temp:  [97.9 F (36.6 C)-98.3 F (36.8 C)] 97.9 F (36.6 C) (07/22 1400) Pulse Rate:  [70-84] 83 (07/22 1400) Resp:  [18] 18 (07/22 1400) BP: (111-125)/(69-79) 111/69 (07/22 1400) SpO2:  [96 %-99 %] 96 % (07/22 1400) Weight:  [102.3 kg] 102.3 kg (07/22 0432)  General: lying in bed CVS: pulse-normal rate and rhythm RS: breathing comfortably Extremities: normal   Neuro: MS: Alert, oriented, follows commands CN: pupils equal and reactive,  EOMI, face symmetric, tongue midline, normal sensation over face, Motor: 5/5 strength in all 4 extremities Reflexes: 1+ bilaterally over patella, biceps, plantars: flexor Coordination: normal Gait: not tested   Basic Metabolic Panel: Recent Labs  Lab 05/10/19 2345 05/11/19 0600 05/13/19 0325  NA 136 139 137  K 3.9 3.9 3.8  CL 106 109 109  CO2 19* 21* 23  GLUCOSE 143* 121* 150*  BUN 11 12 10   CREATININE 1.25* 1.19 1.06  CALCIUM 9.1 8.4* 8.2*    CBC: Recent Labs  Lab 05/10/19 2345 05/11/19 0600 05/13/19 0325  WBC 8.6 7.7 4.1  NEUTROABS 6.1 4.8  --   HGB 14.7 13.9 12.1*  HCT 46.0 42.7 38.4*  MCV 91.8 89.5 91.6  PLT 222 213 184     Coagulation Studies: Recent Labs    05/10/19 2345 05/12/19 0347 05/13/19 0325  LABPROT 16.6* 26.0* 28.4*  INR 1.4* 2.4* 2.7*    Imaging Reviewed:     ASSESSMENT AND PLAN  Patient with history of seizures presented to the ED with breakthrough seizures in the setting of subtherapeutic Depakote level.  Patient denies noncompliance.  After discussion with patient's outpatient neurologist, Depakote was increased to 500 mg a.m. and t check 1000 mg p.m. However, today morning patient was extremely lethargic.  Ammonia level was checked and was 70. Depakote level was also 78 this  morning.  However due to fluctuations in level and elevated ammonia I will go ahead and switch patient to Vimpat as an alternative AED.  Awaiting Keppra during history of bipolar disorder.  Alternative agent may also be Oxcarbamazepine.  Recommendations Continue Vimpat 100 mg twice daily, discontinue Depakote Can resume home gabapentin Outpatient neurology follow-up Seizure precautions and no driving for 6 months  Dashayla Theissen [facility] Pager Number [phone] For questions after 7pm please refer to AMION to reach the Neurologist on call

## 2019-05-14 LAB — PROTIME-INR
INR: 1.9 — ABNORMAL HIGH (ref 0.8–1.2)
Prothrombin Time: 21.7 seconds — ABNORMAL HIGH (ref 11.4–15.2)

## 2019-05-14 MED ORDER — LACTULOSE 10 GM/15ML PO SOLN
10.0000 g | Freq: Two times a day (BID) | ORAL | Status: DC
  Administered 2019-05-14 – 2019-05-15 (×3): 10 g via ORAL
  Filled 2019-05-14 (×3): qty 15

## 2019-05-14 MED ORDER — GABAPENTIN 400 MG PO CAPS: 400.0000 mg | ORAL_CAPSULE | Freq: Three times a day (TID) | ORAL | Status: DC

## 2019-05-14 MED ORDER — WARFARIN SODIUM 5 MG PO TABS
5.0000 mg | ORAL_TABLET | Freq: Once | ORAL | Status: AC
  Administered 2019-05-14: 5 mg via ORAL
  Filled 2019-05-14: qty 1

## 2019-05-14 MED ORDER — GABAPENTIN 800 MG PO TABS
400.0000 mg | ORAL_TABLET | Freq: Three times a day (TID) | ORAL | Status: DC
  Filled 2019-05-14: qty 0.5

## 2019-05-14 MED ORDER — GABAPENTIN 100 MG PO CAPS
200.0000 mg | ORAL_CAPSULE | Freq: Two times a day (BID) | ORAL | Status: DC
  Administered 2019-05-14 – 2019-05-15 (×3): 200 mg via ORAL
  Filled 2019-05-14 (×3): qty 2

## 2019-05-14 NOTE — Plan of Care (Signed)
  Problem: Pain Managment: Goal: General experience of comfort will improve Outcome: Progressing   Problem: Safety: Goal: Ability to remain free from injury will improve Outcome: Progressing   Problem: Skin Integrity: Goal: Risk for impaired skin integrity will decrease Outcome: Progressing   Problem: Clinical Measurements: Goal: Respiratory complications will improve Outcome: Progressing   Problem: Clinical Measurements: Goal: Will remain free from infection Outcome: Progressing   Problem: Clinical Measurements: Goal: Cardiovascular complication will be avoided Outcome: Progressing

## 2019-05-14 NOTE — Progress Notes (Signed)
Physical Therapy Treatment Patient Details Name: Russell Carter MRN: [id number] DOB: 04-02-1956 Today's Date: 05/14/2019    History of Present Illness Russell Carter is a 63 y.o. male with history of seizure, hypertension, pulmonary embolism on Coumadin, polysubstance abuse, chronic kidney disease stage II, nonobstructive CAD, chronic pain was brought to the ER after patient was found to have 4 witnessed seizures back-to-back lasting for less than 2 minutes within a 1 hour.  Patient has stated to ER physician that he has been feeling dizzy and weak last few days had some diarrhea. In the ER, patient had 2 more seizures lasting for less than 2 minutes one was lasting half a minute.CT head CT C-spine L-spine T-spine were unremarkable.      PT Comments    Patient progressing with therapy, increasing ambulation distance and level of independence with gait. More stable and fluid with mobility today, pt reports he is feeling stronger and overall better from prior session. No seizure like activity throughout visit.      Follow Up Recommendations  Home health PT;Supervision for mobility/OOB     Equipment Recommendations  None recommended by PT    Recommendations for Other Services       Precautions / Restrictions Precautions Precautions: Fall;Other (comment)(seizure) Restrictions Weight Bearing Restrictions: No    Mobility  Bed Mobility Overal bed mobility: Needs Assistance Bed Mobility: Supine to Sit;Sit to Supine     Supine to sit: Min guard Sit to supine: Supervision   General bed mobility comments: minguard progressing to EOB, pt with reports of dizziness  Transfers Overall transfer level: Needs assistance Equipment used: Rolling walker (2 wheeled) Transfers: Sit to/from Stand Sit to Stand: Min guard;Min assist         General transfer comment: minA for stability;pt reports dizziness with movement  Ambulation/Gait Ambulation/Gait assistance: Supervision Gait Distance  (Feet): 200 Feet Assistive device: Rolling walker (2 wheeled);None Gait Pattern/deviations: Step-to pattern;Step-through pattern Gait velocity: decreased   General Gait Details: more stability today without AD. fluidity of gait improved   Stairs             Wheelchair Mobility    Modified Rankin (Stroke Patients Only)       Balance Overall balance assessment: Needs assistance Sitting-balance support: No upper extremity supported;Feet supported Sitting balance-Leahy Scale: Fair Sitting balance - Comments: reports dizziness no LOB   Standing balance support: Bilateral upper extremity supported Standing balance-Leahy Scale: Poor Standing balance comment: reliant on BUE support                            Cognition Arousal/Alertness: Lethargic Behavior During Therapy: Flat affect Overall Cognitive Status: Within Functional Limits for tasks assessed                                        Exercises      General Comments        Pertinent Vitals/Pain Pain Location: neck and shoulders Pain Descriptors / Indicators: Sore    Home Living                      Prior Function            PT Goals (current goals can now be found in the care plan section) Acute Rehab PT Goals Patient Stated Goal: for seizures to stop occurring Time For Goal  Achievement: 05/27/19 Potential to Achieve Goals: Good Progress towards PT goals: Progressing toward goals    Frequency    Min 3X/week      PT Plan      Co-evaluation              AM-PAC PT "6 Clicks" Mobility   Outcome Measure  Help needed turning from your back to your side while in a flat bed without using bedrails?: A Little Help needed moving from lying on your back to sitting on the side of a flat bed without using bedrails?: A Little Help needed moving to and from a bed to a chair (including a wheelchair)?: A Little Help needed standing up from a chair using your arms  (e.g., wheelchair or bedside chair)?: A Little Help needed to walk in hospital room?: A Lot Help needed climbing 3-5 steps with a railing? : A Lot 6 Click Score: 16    End of Session Equipment Utilized During Treatment: Gait belt Activity Tolerance: Patient tolerated treatment well Patient left: in bed Nurse Communication: Mobility status PT Visit Diagnosis: Unsteadiness on feet (R26.81)     Time: 1520-1540 PT Time Calculation (min) (ACUTE ONLY): 20 min  Charges:  $Gait Training: 8-22 mins                    Etta Grandchild, PT, DPT Acute Rehabilitation Services Pager: [phone] Office: [phone]    Etta Grandchild 05/14/2019, 3:47 PM

## 2019-05-14 NOTE — Progress Notes (Signed)
Russell Carter for Coumadin Indication: h/o DVT  Allergies  Allergen Reactions  . Shellfish Allergy Anaphylaxis    Patient Measurements: Height: 5\' 11"  (180.3 cm) Weight: 224 lb 3.3 oz (101.7 kg) IBW/kg (Calculated) : 75.3  Vital Signs: Temp: 97.8 F (36.6 C) (07/23 0510) Temp Source: Oral (07/23 0510) BP: 125/85 (07/23 0510) Pulse Rate: 68 (07/23 0510)  Labs: Recent Labs    05/12/19 0347 05/13/19 0325 05/14/19 0241  HGB  --  12.1*  --   HCT  --  38.4*  --   PLT  --  184  --   LABPROT 26.0* 28.4* 21.7*  INR 2.4* 2.7* 1.9*  CREATININE  --  1.06  --     Estimated Creatinine Clearance: 85.5 mL/min (by C-G formula based on SCr of 1.06 mg/dL).    Assessment: 63 y.o. 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  Current home regimen 5 mg MWF, 2.5 mg TTSS with admit INR of 1.4  INR today 1.9  Goal of Therapy:  INR 2-3 Monitor platelets by anticoagulation protocol: Yes   Plan:  Restart warfarin 5 mg today Daily INR  Vasilis Luhman A. Levada Dy, PharmD, [place] Pager: [phone] Please utilize Amion for appropriate phone number to reach the unit pharmacist ([place])

## 2019-05-14 NOTE — Progress Notes (Signed)
PROGRESS NOTE    Russell Carter  URK:[id number] DOB: 19-Dec-1955 DOA: 05/10/2019 PCP: Ladell Pier, MD   Brief Narrative:  Russell Carter a 63 y.o.malewithhistory of seizure, hypertension, pulmonary embolism on Coumadin, polysubstance abuse, chronic kidney disease stage II, nonobstructive CAD, chronic pain was brought to the ER after patient was found to have 4 witnessed seizures back-to-back lasting for less than 2 minutes within a 1 hour. Patient has stated to ER physician that he has been feeling dizzy and weak last few days had some diarrhea. In the ER, patient had 2 more seizures lasting for less than 2 minutes one was lasting half a minute. Patient was given Ativan followed by Keppra IV 1 g. Following which Depakote levels came at around 36 for which patient was loaded with Depakote. Neurology was consulted. CT head CT C-spine L-spine T-spine were unremarkable.   Subjective: -Feels weak, but a little better from yesterday -No breakthrough seizures  Assessment & Plan:   Multiple breakthrough seizures -In setting of subtherapeutic Depakote dose, unclear if he was completely compliant although patient admits to compliance  -CT head: No acute intracranial abnormality -EEG: Negative -Depakote dose increased to 1000 mg nightly and 500 every morning, however became extremely drowsy and caused elevated ammonia level of 70, subsequently Depakote was changed to Vimpat yesterday evening per neurology -Appears to be better on this AED -Also noted to be on high-dose gabapentin at home hence I restarted it at a lower dose yesterday -Continue PT OT, out of bed, ambulate as tolerated  Generalized weakness -Therapy evaluations completed, home health recommended  SIRS, temp of 100 on initial evaluation in the ER -Likely from seizures, infectious work-up negative, chest x-ray unremarkable, UA benign, urine cultures negative, blood cultures negative, he remains afebrile, without  leukocytosis -COVID-19 PCR negative  History of PE -Continue Coumadin per pharmacy dosing  Essential hypertension -Continue Norvasc, Cozaar  Depression -Continue Cymbalta  Hyperlipidemia -Continue Crestor   DVT prophylaxis: Coumadin Code Status: Full code Family Communication: none at bedside Disposition Plan: Home tomorrow if stable   Consultants:   Neurology  Procedures:   EEG  Antimicrobials:  Anti-infectives (From admission, onward)   Start     Dose/Rate Route Frequency Ordered Stop   05/11/19 1200  ceFEPIme (MAXIPIME) 2 g in sodium chloride 0.9 % 100 mL IVPB  Status:  Discontinued     2 g 200 mL/hr over 30 Minutes Intravenous Every 8 hours 05/11/19 0740 05/11/19 0943   05/11/19 1000  vancomycin (VANCOCIN) 1,250 mg in sodium chloride 0.9 % 250 mL IVPB  Status:  Discontinued     1,250 mg 166.7 mL/hr over 90 Minutes Intravenous Every 12 hours 05/11/19 0740 05/11/19 0943   05/11/19 0100  ceFEPIme (MAXIPIME) 2 g in sodium chloride 0.9 % 100 mL IVPB     2 g 200 mL/hr over 30 Minutes Intravenous  Once 05/11/19 0056 05/11/19 0502   05/11/19 0100  metroNIDAZOLE (FLAGYL) IVPB 500 mg     500 mg 100 mL/hr over 60 Minutes Intravenous  Once 05/11/19 0056 05/11/19 0502   05/11/19 0100  vancomycin (VANCOCIN) IVPB 1000 mg/200 mL premix  Status:  Discontinued     1,000 mg 200 mL/hr over 60 Minutes Intravenous  Once 05/11/19 0056 05/11/19 0059   05/11/19 0100  vancomycin (VANCOCIN) 1,500 mg in sodium chloride 0.9 % 500 mL IVPB     1,500 mg 250 mL/hr over 120 Minutes Intravenous  Once 05/11/19 0059 05/11/19 0502       Objective:  Vitals:   05/13/19 2128 05/14/19 0510 05/14/19 0600 05/14/19 0816  BP: 125/72 125/85  138/79  Pulse: 75 68  77  Resp:      Temp: 98 F (36.7 C) 97.8 F (36.6 C)    TempSrc: Oral Oral    SpO2: 98% 97%    Weight:   101.7 kg   Height:        Intake/Output Summary (Last 24 hours) at 05/14/2019 1417 Last data filed at 05/14/2019 1300 Gross  per 24 hour  Intake 515 ml  Output 1000 ml  Net -485 ml   Filed Weights   05/12/19 0500 05/13/19 0432 05/14/19 0600  Weight: 99.3 kg 102.3 kg 101.7 kg    Examination:  Gen: Alert today, still tired appearing, oriented to self place and partly to time HEENT: PERRLA, Neck supple, no JVD Lungs: Good air movement bilaterally, CTAB CVS: RRR,No Gallops,Rubs or new Murmurs Abd: soft, Non tender, non distended, BS present Extremities: No Cyanosis, Clubbing or edema Skin: no new rashes Neuro, nonfocal  Data Reviewed: I have personally reviewed following labs and imaging studies  CBC: Recent Labs  Lab 05/10/19 2345 05/11/19 0600 05/13/19 0325  WBC 8.6 7.7 4.1  NEUTROABS 6.1 4.8  --   HGB 14.7 13.9 12.1*  HCT 46.0 42.7 38.4*  MCV 91.8 89.5 91.6  PLT 222 213 184   Basic Metabolic Panel: Recent Labs  Lab 05/10/19 2345 05/11/19 0600 05/13/19 0325  NA 136 139 137  K 3.9 3.9 3.8  CL 106 109 109  CO2 19* 21* 23  GLUCOSE 143* 121* 150*  BUN 11 12 10   CREATININE 1.25* 1.19 1.06  CALCIUM 9.1 8.4* 8.2*   GFR: Estimated Creatinine Clearance: 85.5 mL/min (by C-G formula based on SCr of 1.06 mg/dL). Liver Function Tests: Recent Labs  Lab 05/10/19 2345 05/11/19 0600  AST 21 18  ALT 15 14  ALKPHOS 46 45  BILITOT 0.3 0.5  PROT 6.6 6.3*  ALBUMIN 3.5 3.2*   No results for input(s): LIPASE, AMYLASE in the last 168 hours. Recent Labs  Lab 05/13/19 1328  AMMONIA 70*   Coagulation Profile: Recent Labs  Lab 05/10/19 2345 05/12/19 0347 05/13/19 0325 05/14/19 0241  INR 1.4* 2.4* 2.7* 1.9*   Cardiac Enzymes: No results for input(s): CKTOTAL, CKMB, CKMBINDEX, TROPONINI in the last 168 hours. BNP (last 3 results) No results for input(s): PROBNP in the last 8760 hours. HbA1C: No results for input(s): HGBA1C in the last 72 hours. CBG: Recent Labs  Lab 05/10/19 2358 05/11/19 0442 05/12/19 0752  GLUCAP 216* 120* 106*   Lipid Profile: No results for input(s): CHOL,  HDL, LDLCALC, TRIG, CHOLHDL, LDLDIRECT in the last 72 hours. Thyroid Function Tests: No results for input(s): TSH, T4TOTAL, FREET4, T3FREE, THYROIDAB in the last 72 hours. Anemia Panel: No results for input(s): VITAMINB12, FOLATE, FERRITIN, TIBC, IRON, RETICCTPCT in the last 72 hours. Sepsis Labs: Recent Labs  Lab 05/10/19 2345 05/11/19 0307 05/11/19 0615 05/11/19 0838  LATICACIDVEN 3.6* 1.7 1.7 1.4    Recent Results (from the past 240 hour(s))  Blood culture (routine x 2)     Status: None (Preliminary result)   Collection Time: 05/10/19 10:45 PM   Specimen: BLOOD  Result Value Ref Range Status   Specimen Description BLOOD LEFT HAND  Final   Special Requests   Final    BOTTLES DRAWN AEROBIC AND ANAEROBIC Blood Culture results may not be optimal due to an inadequate volume of blood received in culture bottles  Culture   Final    NO GROWTH 3 DAYS Performed at [facility], [address] [place], [place] [address]    Report Status PENDING  Incomplete  Blood culture (routine x 2)     Status: None (Preliminary result)   Collection Time: 05/10/19 11:45 PM   Specimen: BLOOD  Result Value Ref Range Status   Specimen Description BLOOD RIGHT HAND  Final   Special Requests   Final    BOTTLES DRAWN AEROBIC AND ANAEROBIC Blood Culture results may not be optimal due to an excessive volume of blood received in culture bottles   Culture   Final    NO GROWTH 3 DAYS Performed at [facility], [address] [place], [place] [address]    Report Status PENDING  Incomplete  SARS Coronavirus 2 (CEPHEID - Performed in [facility] hospital lab), Hosp Order     Status: None   Collection Time: 05/11/19 12:05 AM   Specimen: Nasopharyngeal Swab  Result Value Ref Range Status   SARS Coronavirus 2 NEGATIVE NEGATIVE Final    Comment: (NOTE) If result is NEGATIVE SARS-CoV-2 target nucleic acids are NOT DETECTED. The SARS-CoV-2 RNA is generally detectable in upper and lower   respiratory specimens during the acute phase of infection. The lowest  concentration of SARS-CoV-2 viral copies this assay can detect is 250  copies / mL. A negative result does not preclude SARS-CoV-2 infection  and should not be used as the sole basis for treatment or other  patient management decisions.  A negative result may occur with  improper specimen collection / handling, submission of specimen other  than nasopharyngeal swab, presence of viral mutation(s) within the  areas targeted by this assay, and inadequate number of viral copies  (<250 copies / mL). A negative result must be combined with clinical  observations, patient history, and epidemiological information. If result is POSITIVE SARS-CoV-2 target nucleic acids are DETECTED. The SARS-CoV-2 RNA is generally detectable in upper and lower  respiratory specimens dur ing the acute phase of infection.  Positive  results are indicative of active infection with SARS-CoV-2.  Clinical  correlation with patient history and other diagnostic information is  necessary to determine patient infection status.  Positive results do  not rule out bacterial infection or co-infection with other viruses. If result is PRESUMPTIVE POSTIVE SARS-CoV-2 nucleic acids MAY BE PRESENT.   A presumptive positive result was obtained on the submitted specimen  and confirmed on repeat testing.  While 2019 novel coronavirus  (SARS-CoV-2) nucleic acids may be present in the submitted sample  additional confirmatory testing may be necessary for epidemiological  and / or clinical management purposes  to differentiate between  SARS-CoV-2 and other Sarbecovirus currently known to infect humans.  If clinically indicated additional testing with an alternate test  methodology [phone] is advised. The SARS-CoV-2 RNA is generally  detectable in upper and lower respiratory sp ecimens during the acute  phase of infection. The expected result is Negative. Fact  Sheet for Patients:  BoilerBrush.com.cyhttps://www.fda.gov/media/136312/download Fact Sheet for Healthcare Providers: https://pope.com/https://www.fda.gov/media/136313/download This test is not yet approved or cleared by the [place] FDA and has been authorized for detection and/or diagnosis of SARS-CoV-2 by FDA under an Emergency Use Authorization (EUA).  This EUA will remain in effect (meaning this test can be used) for the duration of the COVID-19 declaration under Section 564(b)(1) of the Act, 21 U.S.C. section 360bbb-3(b)(1), unless the authorization is terminated or revoked sooner. Performed at [facility], [address] [place], [place] [address]   Urine culture     Status: None   Collection Time: 05/11/19  1:07 AM   Specimen: In/Out Cath Urine  Result Value Ref Range Status   Specimen Description IN/OUT CATH URINE  Final   Special Requests NONE  Final   Culture   Final    NO GROWTH Performed at [facility], [address] [place], [place] [address]    Report Status 05/12/2019 FINAL  Final      Radiology Studies: No results found.    Scheduled Meds: . allopurinol  300 mg Oral Daily  . amLODipine  5 mg Oral Daily  . aspirin EC  81 mg Oral Daily  . collagenase   Topical Daily  . DULoxetine  30 mg Oral BID  . folic acid  1 mg Oral Daily  . gabapentin  200 mg Oral BID  . lactulose  10 g Oral BID  . losartan  100 mg Oral Daily  . rosuvastatin  40 mg Oral Daily  . warfarin  5 mg Oral ONCE-1800  . Warfarin - Pharmacist Dosing Inpatient   Does not apply q1800   Continuous Infusions: . lacosamide (VIMPAT) IV 100 mg (05/14/19 0938)     LOS: 3 days    Time spent: 25 minutes   Zannie CovePreetha Erby Sanderson, MD [facility] [email or web address] 05/14/2019, 2:17 PM

## 2019-05-14 NOTE — TOC Initial Note (Addendum)
Transition of Care [facility]) - Initial/Assessment Note    Patient Details  Name: Russell Carter MRN: [id number] Date of Birth: 1955/12/09  Transition of [facility]) CM/SW Contact:    Mearl Latin, LCSW Phone Number: 05/14/2019, 3:43 PM  Clinical Narrative:                 CSW received consult for possible home health services at time of discharge. CSW spoke with patient regarding PT recommendation of Home Health PT at time of discharge. Patient reported that he would like home health services. CSW sent referral for review with [facility] and they are able to start services on Monday ([facility], [facility], [facility], and [facility] all declined patient). CSW confirmed PCP and address with patient. Patient states his family will come pick him up at discharge. No further questions reported at this time.   Expected Discharge Plan: Home w Home Health Services Barriers to Discharge: Continued Medical Work up   Patient Goals and CMS Choice Patient states their goals for this hospitalization and ongoing recovery are:: Return home CMS Medicare.gov Compare Post Acute Care list provided to:: Patient Choice offered to / list presented to : Patient  Expected Discharge Plan and Services Expected Discharge Plan: Home w Home Health Services In-house Referral: NA Discharge Planning Services: CM Consult Post Acute Care Choice: Home Health Living arrangements for the past 2 months: Single Family Home Expected Discharge Date: 05/13/19               DME Arranged: N/A         HH Arranged: PT, OT HH Agency: [facility] (formerly [facility]) Date HH Agency Contacted: 05/14/19 Time HH Agency Contacted: 1542 Representative spoke with at [facility] Agency: Tiffany  Prior Living Arrangements/Services Living arrangements for the past 2 months: Single Family Home Lives with:: Significant Other Patient language and need for interpreter reviewed:: Yes Do you feel safe going back to the place where you  live?: Yes      Need for Family Participation in Patient Care: No (Comment) Care giver support system in place?: Yes (comment) Current home services: DME Criminal Activity/Legal Involvement Pertinent to Current Situation/Hospitalization: No - Comment as needed  Activities of Daily Living Home Assistive Devices/Equipment: Cane (specify quad or straight)(straight) ADL Screening (condition at time of admission) Patient's cognitive ability adequate to safely complete daily activities?: Yes Is the patient deaf or have difficulty hearing?: No Does the patient have difficulty seeing, even when wearing glasses/contacts?: No Does the patient have difficulty concentrating, remembering, or making decisions?: No Patient able to express need for assistance with ADLs?: Yes Does the patient have difficulty dressing or bathing?: Yes Independently performs ADLs?: No Communication: Independent Dressing (OT): Needs assistance Is this a change from baseline?: Pre-admission baseline Grooming: Needs assistance Is this a change from baseline?: Pre-admission baseline Feeding: Independent Bathing: Needs assistance Is this a change from baseline?: Pre-admission baseline Toileting: Needs assistance Is this a change from baseline?: Pre-admission baseline In/Out Bed: Needs assistance Is this a change from baseline?: Pre-admission baseline Walks in Home: Independent with device (comment)(Cane) Does the patient have difficulty walking or climbing stairs?: Yes Weakness of Legs: Both Weakness of Arms/Hands: Left  Permission Sought/Granted Permission sought to share information with : Facility Industrial/product designer granted to share information with : Yes, Verbal Permission Granted     Permission granted to share info w AGENCY: [facility]        Emotional Assessment Appearance:: Appears stated age Attitude/Demeanor/Rapport: Gracious Affect (  typically observed): Accepting Orientation: : Oriented to  Self, Oriented to Place, Oriented to  Time, Oriented to Situation Alcohol / Substance Use: Not Applicable Psych Involvement: No (comment)  Admission diagnosis:  Pain [R52] Seizure [facility]) [R56.9] Patient Active Problem List   Diagnosis Date Noted  . Stroke ([place]) 12/24/2018  . CKD (chronic kidney disease), stage III ([place]) 05/06/2018  . Seizure ([place]) 05/06/2018  . Normocytic anemia 05/06/2018  . Leukopenia 05/06/2018  . AKI (acute kidney injury) ([place])   . Acute kidney injury ([place]) 04/27/2018  . Abdominal pain 04/27/2018  . Chronic anticoagulation 04/27/2018  . Left knee pain 01/10/2018  . Numbness and tingling in both hands 07/31/2017  . Gastroesophageal reflux disease without esophagitis 07/31/2017  . Chronic pain disorder 06/05/2017  . History of pulmonary embolus (PE) 02/14/2017  . Diabetes mellitus type 2 in nonobese (HCC)   . Cocaine abuse ([place])   . Depression   . History of CVA (cerebrovascular accident)   . Cervical spondylosis with radiculopathy 01/24/2017  . Dyslipidemia 03/15/2016  . Cervical spinal stenosis 03/14/2016  . CAD-50% LAD 2011 12/14/2014  . Gout 11/16/2014  . Polysubstance abuse ([place]) 01/15/2013  . DIZZINESS 06/05/2010  . Essential hypertension 05/18/2009   PCP:  Ladell Pier, MD Pharmacy:   [facility], [address] [place] [place] [address] Phone: [phone] Fax: [place], [place] [place] [address] Phone: [phone] Fax: [place], [place] [address] [place] [address] Phone: [phone] Fax: [phone]     Social Determinants of Health (SDOH) Interventions    Readmission Risk Interventions No flowsheet data found.

## 2019-05-14 NOTE — Progress Notes (Addendum)
NEUROLOGY PROGRESS NOTE  Subjective: No significant problems at this time he does state that he feels lethargic.  Exam: Vitals:   05/14/19 0510 05/14/19 0816  BP: 125/85 138/79  Pulse: 68 77  Resp:    Temp: 97.8 F (36.6 C)   SpO2: 97%     Physical Exam   HEENT-  Normocephalic, no lesions, without obvious abnormality.  Normal external eye and conjunctiva.   Extremities- Warm, dry and intact Musculoskeletal-no joint tenderness, deformity or swelling Skin-warm and dry, no hyperpigmentation, vitiligo, or suspicious lesions    Neuro:  Mental Status: Alert, oriented, thought content appropriate.  Speech fluent without evidence of aphasia.  Able to follow 3 step commands without difficulty. Cranial Nerves: II:  Visual fields grossly normal,  III,IV, VI: ptosis not present, extra-ocular motions intact bilaterally pupils equal, round, reactive to light and accommodation V,VII: smile symmetric, facial light touch sensation normal bilaterally VIII: hearing normal bilaterally Motor: Right : Upper extremity   5/5    Left:     Upper extremity   5/5  Lower extremity   5/5     Lower extremity   5/5 Tone and bulk:normal tone throughout; no atrophy noted Sensory: Pinprick and light touch intact throughout, bilaterally Deep Tendon Reflexes: 1+ and symmetric throughout Plantars: Right: downgoing   Left: downgoing     Medications:  Scheduled: . allopurinol  300 mg Oral Daily  . amLODipine  5 mg Oral Daily  . aspirin EC  81 mg Oral Daily  . collagenase   Topical Daily  . DULoxetine  30 mg Oral BID  . folic acid  1 mg Oral Daily  . gabapentin  200 mg Oral BID  . lactulose  10 g Oral BID  . losartan  100 mg Oral Daily  . rosuvastatin  40 mg Oral Daily  . warfarin  5 mg Oral ONCE-1800  . Warfarin - Pharmacist Dosing Inpatient   Does not apply q1800   Continuous: . lacosamide (VIMPAT) IV 100 mg (05/13/19 2220)   HWE:XHBZJIRCVELFY **OR** acetaminophen, nitroGLYCERIN, ondansetron  **OR** ondansetron (ZOFRAN) IV, oxyCODONE-acetaminophen, traZODone  Pertinent Labs/Diagnostics: 05/13/2019 ammonia level was 70     Etta Quill PA-C [facility] [phone]   Assessment: Patient with history of seizures presents the ED with breakthrough seizure in the setting of subtherapeutic Depakote level.  As of yesterday his Depakote level was 78.  Patient was switched to Vimpat due to fluctuations of ammonia levels on Depakote and has had no side effects on the Vimpat.    Recommendations: Continue Vimpat 100 mg twice daily Continue gabapentin at home dose Will need outpatient neurology follow-up with Dr. Jannifer Franklin Seizure precautions and no driving for 6 months.    05/14/2019, 9:12 AM   NEUROHOSPITALIST ADDENDUM Performed a face to face diagnostic evaluation.   I have reviewed the contents of history and physical exam as documented by PA/ARNP/Resident and agree with above documentation.  I have discussed and formulated the above plan as documented. Edits to the note have been made as needed.  Impression: Breakthrough seizure in the setting of subtherapeutic Depakote, discontinued Depakote due to  fluctuating levels ( has been subtherapeutic twice, patient denies non compliance). Started patient on Vimpat. ( Patient has bipolar disorder and therefore not starting pt on Keppra).  Key exam findings: Patient has good strength in all 4 extremities, alert and oriented Plan: AS stated above, continue Vimpat. No driving and follow up with Dr. Jannifer Franklin. Continue gabapentin home dose.     Joriel Streety  MD [facility] [phone]   If 7pm to 7am, please call on call as listed on AMION.

## 2019-05-15 LAB — COMPREHENSIVE METABOLIC PANEL
ALT: 16 U/L (ref 0–44)
AST: 18 U/L (ref 15–41)
Albumin: 3 g/dL — ABNORMAL LOW (ref 3.5–5.0)
Alkaline Phosphatase: 40 U/L (ref 38–126)
Anion gap: 8 (ref 5–15)
BUN: 6 mg/dL — ABNORMAL LOW (ref 8–23)
CO2: 24 mmol/L (ref 22–32)
Calcium: 8.8 mg/dL — ABNORMAL LOW (ref 8.9–10.3)
Chloride: 105 mmol/L (ref 98–111)
Creatinine, Ser: 0.96 mg/dL (ref 0.61–1.24)
GFR calc Af Amer: >60 mL/min (ref >60)
GFR calc non Af Amer: >60 mL/min (ref >60)
Glucose, Bld: 113 mg/dL — ABNORMAL HIGH (ref 70–99)
Potassium: 3.9 mmol/L (ref 3.5–5.1)
Sodium: 137 mmol/L (ref 135–145)
Total Bilirubin: 0.2 mg/dL — ABNORMAL LOW (ref 0.3–1.2)
Total Protein: 6.1 g/dL — ABNORMAL LOW (ref 6.5–8.1)

## 2019-05-15 LAB — CBC
HCT: 40.8 % (ref 39.0–52.0)
Hemoglobin: 13.2 g/dL (ref 13.0–17.0)
MCH: 29.1 pg (ref 26.0–34.0)
MCHC: 32.4 g/dL (ref 30.0–36.0)
MCV: 90.1 fL (ref 80.0–100.0)
Platelets: 193 10*3/uL (ref 150–400)
RBC: 4.53 MIL/uL (ref 4.22–5.81)
RDW: 14 % (ref 11.5–15.5)
WBC: 3.4 10*3/uL — ABNORMAL LOW (ref 4.0–10.5)
nRBC: 0 % (ref 0.0–0.2)

## 2019-05-15 LAB — PROTIME-INR
INR: 1.4 — ABNORMAL HIGH (ref 0.8–1.2)
Prothrombin Time: 17.3 seconds — ABNORMAL HIGH (ref 11.4–15.2)

## 2019-05-15 MED ORDER — GABAPENTIN 100 MG PO CAPS: 200.0000 mg | ORAL_CAPSULE | Freq: Two times a day (BID) | ORAL | 1 refills | Status: AC

## 2019-05-15 MED ORDER — LACOSAMIDE 100 MG PO TABS: 100.0000 mg | ORAL_TABLET | Freq: Two times a day (BID) | ORAL | 0 refills | Status: DC

## 2019-05-15 MED ORDER — LACOSAMIDE 50 MG PO TABS
100.0000 mg | ORAL_TABLET | Freq: Two times a day (BID) | ORAL | Status: DC
  Administered 2019-05-15: 100 mg via ORAL
  Filled 2019-05-15: qty 2

## 2019-05-15 MED ORDER — WARFARIN SODIUM 5 MG PO TABS: 5.0000 mg | ORAL_TABLET | Freq: Once | ORAL | Status: DC

## 2019-05-15 NOTE — Progress Notes (Signed)
Occupational Therapy Treatment Patient Details Name: Mitchelle Goerner MRN: [id number] DOB: [birth year] Today's Date: 05/15/2019    History of present illness Lashawn Munce is a 63 y.o. male with history of seizure, hypertension, pulmonary embolism on Coumadin, polysubstance abuse, chronic kidney disease stage II, nonobstructive CAD, chronic pain was brought to the ER after patient was found to have 4 witnessed seizures back-to-back lasting for less than 2 minutes within a 1 hour.  Patient has stated to ER physician that he has been feeling dizzy and weak last few days had some diarrhea. In the ER, patient had 2 more seizures lasting for less than 2 minutes one was lasting half a minute.CT head CT C-spine L-spine T-spine were unremarkable.     OT comments  Pt. Seen for skilled OT treatment session.  Pt. Able to complete bed mobility, toileting tasks, and simulated tub transfer. This session denies any visual issues even with prior documentation of.  Eager for d/c home later today.    Follow Up Recommendations  Home health OT;Supervision - Intermittent    Equipment Recommendations  None recommended by OT    Recommendations for Other Services      Precautions / Restrictions Precautions Precautions: Fall Restrictions Weight Bearing Restrictions: No       Mobility Bed Mobility Overal bed mobility: Modified Independent Bed Mobility: Supine to Sit     Supine to sit: Modified independent (Device/Increase time) Sit to supine: Modified independent (Device/Increase time)   General bed mobility comments: pt able to perform supine <> sit with use of log roll technique with mod I from flat bed  Transfers   Equipment used: None(intermittent furniture walking)   Sit to Stand: Modified independent (Device/Increase time)         General transfer comment: pt with no c/o dizziness    Balance Overall balance assessment: Modified Independent             Standing balance comment: mod I  wiht RW                           ADL either performed or assessed with clinical judgement   ADL Overall ADL's : Needs assistance/impaired     Grooming: Min guard;Standing Grooming Details (indicate cue type and reason): simulated during ambulation to/from b.room                 Toilet Transfer: Min guard;Minimal assistance;Ambulation   Toileting- Clothing Manipulation and Hygiene: Min guard Toileting - Clothing Manipulation Details (indicate cue type and reason): simulated during transfer, did not actually have to use the restroom Tub/ Shower Transfer: Tub Herbalist Details (indicate cue type and reason): pt. reports he has a bathtub able to simulate stepping over the tub Functional mobility during ADLs: Min guard General ADL Comments: states he has no vision problems or has ever had vision problems. states he "only wears the glasses because they told me i had to" states he has no issues at all.  i explained that admitting to issues would not change his d/c that there isnt a "wrong" answer.  he cont. to deny.     Vision       Perception     Praxis      Cognition Arousal/Alertness: Awake/alert Behavior During Therapy: Flat affect Overall Cognitive Status: Within Functional Limits for tasks assessed  Exercises     Shoulder Instructions       General Comments      Pertinent Vitals/ Pain       Pain Assessment: No/denies pain  Home Living                                          Prior Functioning/Environment              Frequency  Min 2X/week        Progress Toward Goals  OT Goals(current goals can now be found in the care plan section)  Progress towards OT goals: Progressing toward goals     Plan Discharge plan remains appropriate    Co-evaluation                 AM-PAC OT "6 Clicks" Daily Activity      Outcome Measure   Help from another person eating meals?: A Little Help from another person taking care of personal grooming?: A Little Help from another person toileting, which includes using toliet, bedpan, or urinal?: A Little Help from another person bathing (including washing, rinsing, drying)?: A Little Help from another person to put on and taking off regular upper body clothing?: A Little Help from another person to put on and taking off regular lower body clothing?: A Little 6 Click Score: 18    End of Session    OT Visit Diagnosis: Other abnormalities of gait and mobility (R26.89);Pain;Low vision, both eyes (H54.2)   Activity Tolerance Patient tolerated treatment well   Patient Left in bed;with call bell/phone within reach   Nurse Communication          Time: 0955-1003 OT Time Calculation (min): 8 min  Charges: OT General Charges $OT Visit: 1 Visit OT Treatments $Self Care/Home Management : 8-22 mins   Robet Leu, COTA/L 05/15/2019, 10:52 AM

## 2019-05-15 NOTE — Progress Notes (Signed)
Physical Therapy Treatment Patient Details Name: Russell Carter MRN: [id number] DOB: 1956-01-05 Today's Date: 05/15/2019    History of Present Illness Russell Carter is a 63 y.o. male with history of seizure, hypertension, pulmonary embolism on Coumadin, polysubstance abuse, chronic kidney disease stage II, nonobstructive CAD, chronic pain was brought to the ER after patient was found to have 4 witnessed seizures back-to-back lasting for less than 2 minutes within a 1 hour.  Patient has stated to ER physician that he has been feeling dizzy and weak last few days had some diarrhea. In the ER, patient had 2 more seizures lasting for less than 2 minutes one was lasting half a minute.CT head CT C-spine L-spine T-spine were unremarkable.      PT Comments    Pt with improving activity tolerance, able to perform steps today with supervision without SOB. Pt continues with stiff gait pattern but no LOB with use of RW.   Follow Up Recommendations  Home health PT;Supervision for mobility/OOB     Equipment Recommendations  None recommended by PT    Recommendations for Other Services       Precautions / Restrictions Precautions Precautions: Fall Restrictions Weight Bearing Restrictions: No    Mobility  Bed Mobility         Supine to sit: Modified independent (Device/Increase time) Sit to supine: Modified independent (Device/Increase time)   General bed mobility comments: pt able to perform supine <> sit with use of log roll technique with mod I from flat bed  Transfers   Equipment used: Rolling walker (2 wheeled)   Sit to Stand: Modified independent (Device/Increase time)         General transfer comment: pt with no c/o dizziness  Ambulation/Gait Ambulation/Gait assistance: Modified independent (Device/Increase time) Gait Distance (Feet): 200 Feet Assistive device: Rolling walker (2 wheeled)   Gait velocity: decreased   General Gait Details: wide BOS, no  LOB   Stairs Stairs: Yes Stairs assistance: Supervision Stair Management: One rail Right Number of Stairs: 3 General stair comments: pt able to perform stiars with supervision, pt states he will have 1 railing on stairs at home   Wheelchair Mobility    Modified Rankin (Stroke Patients Only)       Balance Overall balance assessment: Modified Independent             Standing balance comment: mod I wiht RW                            Cognition Arousal/Alertness: Awake/alert Behavior During Therapy: Flat affect Overall Cognitive Status: Within Functional Limits for tasks assessed                                        Exercises      General Comments        Pertinent Vitals/Pain Pain Assessment: No/denies pain    Home Living                      Prior Function            PT Goals (current goals can now be found in the care plan section) Progress towards PT goals: Progressing toward goals    Frequency    Min 3X/week      PT Plan Current plan remains appropriate    Co-evaluation  AM-PAC PT "6 Clicks" Mobility   Outcome Measure  Help needed turning from your back to your side while in a flat bed without using bedrails?: None Help needed moving from lying on your back to sitting on the side of a flat bed without using bedrails?: None Help needed moving to and from a bed to a chair (including a wheelchair)?: A Little Help needed standing up from a chair using your arms (e.g., wheelchair or bedside chair)?: A Little Help needed to walk in hospital room?: A Little Help needed climbing 3-5 steps with a railing? : A Little 6 Click Score: 20    End of Session   Activity Tolerance: Patient tolerated treatment well Patient left: in bed;with call bell/phone within reach Nurse Communication: Mobility status PT Visit Diagnosis: Unsteadiness on feet (R26.81)     Time: 1751-0258 PT Time Calculation  (min) (ACUTE ONLY): 14 min  Charges:  $Gait Training: 8-22 mins                     Neysa Hotter   Dozier Berkovich 05/15/2019, 9:54 AM

## 2019-05-15 NOTE — Progress Notes (Signed)
[place] for Coumadin Indication: h/o DVT  Allergies  Allergen Reactions  . Shellfish Allergy Anaphylaxis    Patient Measurements: Height: 5\' 11"  (180.3 cm) Weight: 214 lb 8.1 oz (97.3 kg) IBW/kg (Calculated) : 75.3  Vital Signs: Temp: 98.6 F (37 C) (07/24 0556) BP: 129/80 (07/24 0556) Pulse Rate: 70 (07/24 0556)  Labs: Recent Labs    05/13/19 0325 05/14/19 0241 05/15/19 0503  HGB 12.1*  --  13.2  HCT 38.4*  --  40.8  PLT 184  --  193  LABPROT 28.4* 21.7* 17.3*  INR 2.7* 1.9* 1.4*  CREATININE 1.06  --  0.96    Estimated Creatinine Clearance: 92.5 mL/min (by C-G formula based on SCr of 0.96 mg/dL).    Assessment: 63 y.o. 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  Current home regimen 5 mg MWF, 2.5 mg TTSS with admit INR of 1.4  INR today 1.4  Goal of Therapy:  INR 2-3 Monitor platelets by anticoagulation protocol: Yes   Plan:  Continue warfarin 5 mg today Daily INR, monitor for bleeding   Acey Lav, PharmD  PGY1 Pharmacy Resident [facility] [phone]  Please utilize Amion for appropriate phone number to reach the unit pharmacist ([place])

## 2019-05-15 NOTE — Discharge Summary (Signed)
Vimpat 100mg  disposed in hazardous waste bin

## 2019-05-15 NOTE — Discharge Summary (Signed)
Physician Discharge Summary  Russell Carter XKG:[id number] DOB: August 27, 1956 DOA: 05/10/2019  PCP: Russell Pier, MD  Admit date: 05/10/2019 Discharge date: 05/15/2019  Time spent: 35 minutes  Recommendations for Outpatient Follow-up:  1. PCP Dr. Wynetta Carter in 1 week, consider titrating down sedating medications 2. Neurology Dr. Jannifer Carter in 2 to 3 weeks, antiepileptics changed from Depakote to Vimpat 3. Home health physical therapy   Discharge Diagnoses:  Principal Problem:   Seizure [facility]) Active Problems:   Essential hypertension   Polysubstance abuse ([place])   CAD-50% LAD 2011   Diabetes mellitus type 2 in nonobese [facility])   History of pulmonary embolus (PE)   CKD (chronic kidney disease), stage III [facility])   Discharge Condition: 35  Diet recommendation: Diabetic, heart healthy  Filed Weights   05/13/19 0432 05/14/19 0600 05/15/19 0500  Weight: 102.3 kg 101.7 kg 97.3 kg    History of present illness:  Russell Carter a 63 y.o.malewithhistory of seizure, hypertension, pulmonary embolism on Coumadin, polysubstance abuse, chronic kidney disease stage II, nonobstructive CAD, chronic pain was brought to the ER after patient was found to have 4 witnessed seizures back-to-back lasting for less than 2 minutes within a 1 hour. Patient has stated to ER physician that he has been feeling dizzy and weak last few days had some diarrhea. In the ER,patient had 2 more seizures lasting for less than 2 minutes one was lasting half a minute. Patient was given Ativan followed by Keppra IV 1 g. Following which Depakote levels came at around 36 for which patient was loaded with Depakote. Neurology was consulted. CT head CT C-spine L-spine T-spine were unremarkable.  Hospital Course:   Multiple breakthrough seizures -In setting of subtherapeutic Depakote dose, unclear if he was completely compliant although patient admits to compliance  -CT head: No acute intracranial abnormality -EEG:  Negative -Neurology consulted, they originally increase Depakote dose from 500 mg twice daily to 1000 mg nightly and 500 every morning, however became extremely drowsy and caused elevated ammonia level of 70, subsequently Depakote was changed to Vimpat evening per neurology -Appears to be better on this AED -Also noted to be on high-dose gabapentin at home, due to oversedation we restarted gabapentin at a lower dose of 200 mg twice daily instead of 600 mg 4 times daily -PT OT evaluation completed, home health PT recommended and set up -Neurology follow-up with Dr. Jannifer Carter in 2 to 3 weeks  Generalized weakness -Therapy evaluations completed, home health recommended  SIRS, temp of 100 on initial evaluation in the ER -Likely from seizures, infectious work-up negative, chest x-ray unremarkable, UA benign, urine cultures negative, blood cultures negative, he remains afebrile, without leukocytosis -COVID-19 PCR negative  History of PE -Continue Coumadin per pharmacy dosing  Essential hypertension -Continue Norvasc, Cozaar  Depression -Continue Cymbalta  Hyperlipidemia -Continue Crestor  Chronic pain -On Percocet gabapentin, Flexeril, Cymbalta at baseline -Due to oversedation advised caution with these medications, gabapentin dose decreased  Discharge Exam: Vitals:   05/15/19 0556 05/15/19 1254  BP: 129/80 137/85  Pulse: 70 79  Resp: 16 18  Temp: 98.6 F (37 C) 98.7 F (37.1 C)  SpO2: 100% 98%    General: AAO x3 Cardiovascular: S1-S2/regular rate rhythm Respiratory: Clear bilaterally  Discharge Instructions   Discharge Instructions    Diet - low sodium heart healthy   Complete by: As directed    Discharge instructions   Complete by: As directed    NO driving, operating machinery, underwater activity until cleared by Neurology  Increase activity slowly   Complete by: As directed      Allergies as of 05/15/2019      Reactions   Shellfish Allergy Anaphylaxis       Medication List    STOP taking these medications   divalproex 500 MG DR tablet Commonly known as: DEPAKOTE   hydrOXYzine 25 MG tablet Commonly known as: ATARAX/VISTARIL   metoCLOPramide 10 MG tablet Commonly known as: REGLAN   traZODone 100 MG tablet Commonly known as: DESYREL     TAKE these medications   allopurinol 300 MG tablet Commonly known as: ZYLOPRIM Take 1 tablet (300 mg total) by mouth daily.   amLODipine 5 MG tablet Commonly known as: NORVASC Take 1 tablet (5 mg total) by mouth daily.   aspirin EC 81 MG tablet Take 1 tablet (81 mg total) by mouth daily.   Calcium-Vitamin D 600-400 MG-UNIT Tabs Take 1 tablet by mouth daily.   DULoxetine 30 MG capsule Commonly known as: Cymbalta Take 1 capsule (30 mg total) by mouth 2 (two) times daily.   folic acid 1 MG tablet Commonly known as: FOLVITE Take 1 tablet (1 mg total) by mouth daily.   gabapentin 100 MG capsule Commonly known as: NEURONTIN Take 2 capsules (200 mg total) by mouth 2 (two) times daily. What changed:   medication strength  how much to take  Another medication with the same name was removed. Continue taking this medication, and follow the directions you see here.   Lacosamide 100 MG Tabs Take 1 tablet (100 mg total) by mouth 2 (two) times daily.   losartan 100 MG tablet Commonly known as: COZAAR Take 1 tablet (100 mg total) by mouth daily.   methocarbamol 750 MG tablet Commonly known as: ROBAXIN Take 1 tablet (750 mg total) by mouth every 8 (eight) hours as needed for muscle spasms.   multivitamin with minerals Tabs tablet Take 1 tablet by mouth daily.   Narcan 4 MG/0.1ML Liqd nasal spray kit Generic drug: naloxone Place 1 spray into the nose once.   nitroGLYCERIN 0.4 MG SL tablet Commonly known as: NITROSTAT Place 1 tablet (0.4 mg total) under the tongue every 5 (five) minutes as needed for chest pain.   oxyCODONE-acetaminophen 7.5-325 MG tablet Commonly known as:  Percocet Take 1 tablet by mouth 3 (three) times daily.   rosuvastatin 40 MG tablet Commonly known as: CRESTOR Take 1 tablet (40 mg total) by mouth daily.   tamsulosin 0.4 MG Caps capsule Commonly known as: FLOMAX Take 0.4 mg by mouth daily.   Vitamin D (Ergocalciferol) 1.25 MG (50000 UT) Caps capsule Commonly known as: DRISDOL Take 50,000 Units by mouth every 7 (seven) days. Sunday   warfarin 5 MG tablet Commonly known as: COUMADIN Take as directed. If you are unsure how to take this medication, talk to your nurse or doctor. Original instructions: Take 5 mg by mouth. As directed      Allergies  Allergen Reactions  . Shellfish Allergy Anaphylaxis   Follow-up Information    [facility].   Specialty: Home Health Services Why: [employer]; will begin on 05/18/19. Contact information: [address] [place] [address] [place]. Schedule an appointment as soon as possible for a visit.   Why: Please make a hospital follow up appointment.  Contact information: [address] [place] [address] [phone]  Kathrynn Ducking, MD. Schedule an appointment as soon as possible for a visit in 1 month(s).   Specialty: Neurology Contact information: [address] [place] [address] [phone]            The results of significant diagnostics from this hospitalization (including imaging, microbiology, ancillary and laboratory) are listed below for reference.    Significant Diagnostic Studies: Dg Thoracic Spine 2 View  Result Date: 05/11/2019 CLINICAL DATA:  Back pain EXAM: THORACIC SPINE 2 VIEWS COMPARISON:  None. FINDINGS: There is no evidence of thoracic spine fracture. Alignment is normal. No other significant bone abnormalities are identified. IMPRESSION: Negative. Electronically Signed   By: Ulyses Jarred M.D.   On: 05/11/2019  01:11   Dg Lumbar Spine Complete  Result Date: 05/11/2019 CLINICAL DATA:  63 year old male with back pain. EXAM: LUMBAR SPINE - COMPLETE 4+ VIEW COMPARISON:  Abdominal radiograph dated 05/06/2018 and CT dated 04/30/2018 FINDINGS: There is no acute fracture or subluxation of the lumbar spine. The vertebral body heights and disc spaces are maintained. Multilevel facet arthropathy. Grade 1 L3-L4 retrolisthesis. The soft tissues are unremarkable. IMPRESSION: 1. No acute/traumatic lumbar spine pathology. 2. Degenerative changes. Electronically Signed   By: Anner Crete M.D.   On: 05/11/2019 01:12   Ct Head Wo Contrast  Result Date: 05/11/2019 CLINICAL DATA:  Headache, post traumatic; C-spine fx, traumatic headache and neck pain. Seizure. EXAM: CT HEAD WITHOUT CONTRAST CT CERVICAL SPINE WITHOUT CONTRAST TECHNIQUE: Multidetector CT imaging of the head and cervical spine was performed following the standard protocol without intravenous contrast. Multiplanar CT image reconstructions of the cervical spine were also generated. COMPARISON:  Multiple prior exams most recently head and cervical spine CT 04/13/2019 FINDINGS: CT HEAD FINDINGS Brain: No intracranial hemorrhage, mass effect, or midline shift. Age related atrophy. Similar chronic small vessel ischemia to prior. No hydrocephalus. The basilar cisterns are patent. No evidence of territorial infarct or acute ischemia. No extra-axial or intracranial fluid collection. Vascular: No hyperdense vessel. Skull: No fracture or focal lesion. Sinuses/Orbits: Boxer mucosal thickening of ethmoid air cells. No sinus fluid levels. Mastoid air cells are clear. Orbits are unremarkable. Other: None. CT CERVICAL SPINE FINDINGS Alignment: Postsurgical straightening. No traumatic subluxation. Skull base and vertebrae: No acute fracture. Dens and skull base are intact. Posterior C3 through T1 fusion and laminectomy. Anterior C4 through C7 fusion. No acute fracture. The dens and  skull base are intact. Streak artifact from surgical hardware. Soft tissues and spinal canal: No prevertebral soft tissue edema. Canal partially obscured by streak artifact. Disc levels: Anterior fusion with discectomy C4-C5 through C6-C7. Mild endplate spurring X1-G6. Upper chest: No acute findings. Other: None. IMPRESSION: 1. No acute intracranial abnormality. No skull fracture. Mild chronic small vessel ischemia is unchanged from prior. 2. Postsurgical change in the cervical spine without acute fracture or subluxation. Electronically Signed   By: Keith Rake M.D.   On: 05/11/2019 01:20   Ct Cervical Spine Wo Contrast  Result Date: 05/11/2019 CLINICAL DATA:  Headache, post traumatic; C-spine fx, traumatic headache and neck pain. Seizure. EXAM: CT HEAD WITHOUT CONTRAST CT CERVICAL SPINE WITHOUT CONTRAST TECHNIQUE: Multidetector CT imaging of the head and cervical spine was performed following the standard protocol without intravenous contrast. Multiplanar CT image reconstructions of the cervical spine were also generated. COMPARISON:  Multiple prior exams most recently head and cervical spine CT 04/13/2019 FINDINGS: CT HEAD FINDINGS Brain: No intracranial hemorrhage, mass effect, or midline shift. Age related atrophy. Similar chronic small vessel  ischemia to prior. No hydrocephalus. The basilar cisterns are patent. No evidence of territorial infarct or acute ischemia. No extra-axial or intracranial fluid collection. Vascular: No hyperdense vessel. Skull: No fracture or focal lesion. Sinuses/Orbits: Kobel mucosal thickening of ethmoid air cells. No sinus fluid levels. Mastoid air cells are clear. Orbits are unremarkable. Other: None. CT CERVICAL SPINE FINDINGS Alignment: Postsurgical straightening. No traumatic subluxation. Skull base and vertebrae: No acute fracture. Dens and skull base are intact. Posterior C3 through T1 fusion and laminectomy. Anterior C4 through C7 fusion. No acute fracture. The dens  and skull base are intact. Streak artifact from surgical hardware. Soft tissues and spinal canal: No prevertebral soft tissue edema. Canal partially obscured by streak artifact. Disc levels: Anterior fusion with discectomy C4-C5 through C6-C7. Mild endplate spurring J4-N8. Upper chest: No acute findings. Other: None. IMPRESSION: 1. No acute intracranial abnormality. No skull fracture. Mild chronic small vessel ischemia is unchanged from prior. 2. Postsurgical change in the cervical spine without acute fracture or subluxation. Electronically Signed   By: Keith Rake M.D.   On: 05/11/2019 01:20   Dg Chest Portable 1 View  Result Date: 05/11/2019 CLINICAL DATA:  63 year old male with cough EXAM: PORTABLE CHEST 1 VIEW COMPARISON:  Chest radiograph dated 04/09/2019 FINDINGS: Shallow inspiration. There is mild eventration of the left hemidiaphragm with associated subsegmental atelectasis. No focal consolidation, pleural effusion, or pneumothorax. The cardiac silhouette is within normal limits. No acute osseous pathology. Extensive cervical spine fixation hardware. IMPRESSION: No active disease. Electronically Signed   By: Anner Crete M.D.   On: 05/11/2019 00:29    Microbiology: Recent Results (from the past 240 hour(s))  Blood culture (routine x 2)     Status: None (Preliminary result)   Collection Time: 05/10/19 10:45 PM   Specimen: BLOOD  Result Value Ref Range Status   Specimen Description BLOOD LEFT HAND  Final   Special Requests   Final    BOTTLES DRAWN AEROBIC AND ANAEROBIC Blood Culture results may not be optimal due to an inadequate volume of blood received in culture bottles   Culture   Final    NO GROWTH 4 DAYS Performed at [facility], [place] [address] [place], [place] [address]    Report Status PENDING  Incomplete  Blood culture (routine x 2)     Status: None (Preliminary result)   Collection Time: 05/10/19 11:45 PM   Specimen: BLOOD  Result Value Ref Range Status    Specimen Description BLOOD RIGHT HAND  Final   Special Requests   Final    BOTTLES DRAWN AEROBIC AND ANAEROBIC Blood Culture results may not be optimal due to an excessive volume of blood received in culture bottles   Culture   Final    NO GROWTH 4 DAYS Performed at [facility], [place] [address], [place], [place] [address]    Report Status PENDING  Incomplete  SARS Coronavirus 2 (CEPHEID - Performed in [place] hospital lab), Hosp Order     Status: None   Collection Time: 05/11/19 12:05 AM   Specimen: Nasopharyngeal Swab  Result Value Ref Range Status   SARS Coronavirus 2 NEGATIVE NEGATIVE Final    Comment: (NOTE) If result is NEGATIVE SARS-CoV-2 target nucleic acids are NOT DETECTED. The SARS-CoV-2 RNA is generally detectable in upper and lower  respiratory specimens during the acute phase of infection. The lowest  concentration of SARS-CoV-2 viral copies this assay can detect is 250  copies / mL. A negative result  does not preclude SARS-CoV-2 infection  and should not be used as the sole basis for treatment or other  patient management decisions.  A negative result may occur with  improper specimen collection / handling, submission of specimen other  than nasopharyngeal swab, presence of viral mutation(s) within the  areas targeted by this assay, and inadequate number of viral copies  (<250 copies / mL). A negative result must be combined with clinical  observations, patient history, and epidemiological information. If result is POSITIVE SARS-CoV-2 target nucleic acids are DETECTED. The SARS-CoV-2 RNA is generally detectable in upper and lower  respiratory specimens dur ing the acute phase of infection.  Positive  results are indicative of active infection with SARS-CoV-2.  Clinical  correlation with patient history and other diagnostic information is  necessary to determine patient infection status.  Positive results do  not rule out bacterial infection or co-infection  with other viruses. If result is PRESUMPTIVE POSTIVE SARS-CoV-2 nucleic acids MAY BE PRESENT.   A presumptive positive result was obtained on the submitted specimen  and confirmed on repeat testing.  While 2019 novel coronavirus  (SARS-CoV-2) nucleic acids may be present in the submitted sample  additional confirmatory testing may be necessary for epidemiological  and / or clinical management purposes  to differentiate between  SARS-CoV-2 and other Sarbecovirus currently known to infect humans.  If clinically indicated additional testing with an alternate test  methodology [phone] is advised. The SARS-CoV-2 RNA is generally  detectable in upper and lower respiratory sp ecimens during the acute  phase of infection. The expected result is Negative. Fact Sheet for Patients:  StrictlyIdeas.no Fact Sheet for Healthcare Providers: BankingDealers.co.za This test is not yet approved or cleared by the [place] FDA and has been authorized for detection and/or diagnosis of SARS-CoV-2 by FDA under an Emergency Use Authorization (EUA).  This EUA will remain in effect (meaning this test can be used) for the duration of the COVID-19 declaration under Section 564(b)(1) of the Act, 21 U.S.C. section 360bbb-3(b)(1), unless the authorization is terminated or revoked sooner. Performed at [facility], [place] [address] [place], [place] [address]   Urine culture     Status: None   Collection Time: 05/11/19  1:07 AM   Specimen: In/Out Cath Urine  Result Value Ref Range Status   Specimen Description IN/OUT CATH URINE  Final   Special Requests NONE  Final   Culture   Final    NO GROWTH Performed at [facility], [place] [address], [place],  [address]    Report Status 05/12/2019 FINAL  Final     Labs: Basic Metabolic Panel: Recent Labs  Lab 05/10/19 2345 05/11/19 0600 05/13/19 0325 05/15/19 0503  NA 136 139 137 137  K 3.9 3.9  3.8 3.9  CL 106 109 109 105  CO2 19* 21* 23 24  GLUCOSE 143* 121* 150* 113*  BUN '11 12 10 '$ 6*  CREATININE 1.25* 1.19 1.06 0.96  CALCIUM 9.1 8.4* 8.2* 8.8*   Liver Function Tests: Recent Labs  Lab 05/10/19 2345 05/11/19 0600 05/15/19 0503  AST '21 18 18  '$ ALT '15 14 16  '$ ALKPHOS 46 45 40  BILITOT 0.3 0.5 0.2*  PROT 6.6 6.3* 6.1*  ALBUMIN 3.5 3.2* 3.0*   No results for input(s): LIPASE, AMYLASE in the last 168 hours. Recent Labs  Lab 05/13/19 1328  AMMONIA 70*   CBC: Recent Labs  Lab 05/10/19 2345 05/11/19 0600 05/13/19 0325 05/15/19 0503  WBC  8.6 7.7 4.1 3.4*  NEUTROABS 6.1 4.8  --   --   HGB 14.7 13.9 12.1* 13.2  HCT 46.0 42.7 38.4* 40.8  MCV 91.8 89.5 91.6 90.1  PLT 222 213 184 193   Cardiac Enzymes: No results for input(s): CKTOTAL, CKMB, CKMBINDEX, TROPONINI in the last 168 hours. BNP: BNP (last 3 results) Recent Labs    04/13/19 1619  BNP 9.6    ProBNP (last 3 results) No results for input(s): PROBNP in the last 8760 hours.  CBG: Recent Labs  Lab 05/10/19 2358 05/11/19 0442 05/12/19 0752  GLUCAP 216* 120* 106*       Signed:  Domenic Polite MD.  [facility] 05/15/2019, 2:12 PM

## 2019-05-15 NOTE — Progress Notes (Signed)
Nsg Discharge Note  Patient to be discharged home with Home Health services. Patient awaiting transportation at 1330.  Admit Date:  05/10/2019 Discharge date: 05/15/2019   Russell Carter to be D/C'd Home per MD order.  AVS completed.  Copy for chart, and copy for patient signed, and dated. Patient/caregiver able to verbalize understanding.  Discharge Medication: Allergies as of 05/15/2019      Reactions   Shellfish Allergy Anaphylaxis      Medication List    STOP taking these medications   divalproex 500 MG DR tablet Commonly known as: DEPAKOTE   hydrOXYzine 25 MG tablet Commonly known as: ATARAX/VISTARIL   metoCLOPramide 10 MG tablet Commonly known as: REGLAN   traZODone 100 MG tablet Commonly known as: DESYREL     TAKE these medications   allopurinol 300 MG tablet Commonly known as: ZYLOPRIM Take 1 tablet (300 mg total) by mouth daily.   amLODipine 5 MG tablet Commonly known as: NORVASC Take 1 tablet (5 mg total) by mouth daily.   aspirin EC 81 MG tablet Take 1 tablet (81 mg total) by mouth daily.   Calcium-Vitamin D 600-400 MG-UNIT Tabs Take 1 tablet by mouth daily.   DULoxetine 30 MG capsule Commonly known as: Cymbalta Take 1 capsule (30 mg total) by mouth 2 (two) times daily.   folic acid 1 MG tablet Commonly known as: FOLVITE Take 1 tablet (1 mg total) by mouth daily.   gabapentin 100 MG capsule Commonly known as: NEURONTIN Take 2 capsules (200 mg total) by mouth 2 (two) times daily. What changed:   medication strength  how much to take  Another medication with the same name was removed. Continue taking this medication, and follow the directions you see here.   Lacosamide 100 MG Tabs Take 1 tablet (100 mg total) by mouth 2 (two) times daily.   losartan 100 MG tablet Commonly known as: COZAAR Take 1 tablet (100 mg total) by mouth daily.   methocarbamol 750 MG tablet Commonly known as: ROBAXIN Take 1 tablet (750 mg total) by mouth every 8  (eight) hours as needed for muscle spasms.   multivitamin with minerals Tabs tablet Take 1 tablet by mouth daily.   Narcan 4 MG/0.1ML Liqd nasal spray kit Generic drug: naloxone Place 1 spray into the nose once.   nitroGLYCERIN 0.4 MG SL tablet Commonly known as: NITROSTAT Place 1 tablet (0.4 mg total) under the tongue every 5 (five) minutes as needed for chest pain.   oxyCODONE-acetaminophen 7.5-325 MG tablet Commonly known as: Percocet Take 1 tablet by mouth 3 (three) times daily.   rosuvastatin 40 MG tablet Commonly known as: CRESTOR Take 1 tablet (40 mg total) by mouth daily.   tamsulosin 0.4 MG Caps capsule Commonly known as: FLOMAX Take 0.4 mg by mouth daily.   Vitamin D (Ergocalciferol) 1.25 MG (50000 UT) Caps capsule Commonly known as: DRISDOL Take 50,000 Units by mouth every 7 (seven) days. Sunday   warfarin 5 MG tablet Commonly known as: COUMADIN Take as directed. If you are unsure how to take this medication, talk to your nurse or doctor. Original instructions: Take 5 mg by mouth. As directed       Discharge Assessment: Vitals:   05/14/19 2119 05/15/19 0556  BP: 138/86 129/80  Pulse: 76 70  Resp: 16 16  Temp: 98.5 F (36.9 C) 98.6 F (37 C)  SpO2: 99% 100%   Skin clean, dry and intact without evidence of skin break down, no evidence of skin tears  noted. IV catheter discontinued intact. Site without signs and symptoms of complications - no redness or edema noted at insertion site, patient denies c/o pain - only slight tenderness at site.  Dressing with slight pressure applied.  D/c Instructions-Education: Discharge instructions given to patient/family with verbalized understanding. D/c education completed with patient/family including follow up instructions, medication list, d/c activities limitations if indicated, with other d/c instructions as indicated by MD - patient able to verbalize understanding, all questions fully answered. Patient instructed to  return to ED, call 911, or call MD for any changes in condition.  Patient escorted via [place], and D/C home via private auto.  Erasmo Leventhal, RN 05/15/2019 12:46 PM

## 2019-05-15 NOTE — TOC Transition Note (Signed)
Transition of Care [facility]) - CM/SW Discharge Note   Patient Details  Name: Prajwal Fellner MRN: [id number] Date of Birth: [birth year]  [facility] CM/SW Contact:  Benard Halsted, LCSW Phone Number: 05/15/2019, 9:39 AM   Clinical Narrative:    Patient discharging home today. [facility] aware of PT/RN orders.    Final next level of care: Manuel Garcia Barriers to Discharge: No Barriers Identified   Patient Goals and CMS Choice Patient states their goals for this hospitalization and ongoing recovery are:: Return home CMS Medicare.gov Compare Post Acute Care list provided to:: Patient Choice offered to / list presented to : Patient  Discharge Placement                    Patient and family notified of of transfer: 05/15/19  Discharge Plan and Services In-house Referral: NA Discharge Planning Services: CM Consult Post Acute Care Choice: Home Health          DME Arranged: N/A         HH Arranged: PT, OT [place] Agency: [facility] (formerly [employer]) Date [place]: 05/14/19 Time [place]: 1542 Representative spoke with at [place]: [place] ([place]) Interventions     Readmission Risk Interventions No flowsheet data found.

## 2019-05-16 LAB — CULTURE, BLOOD (ROUTINE X 2)
Culture: NO GROWTH
Culture: NO GROWTH

## 2019-05-18 ENCOUNTER — Telehealth: Payer: Self-pay | Admitting: Internal Medicine

## 2019-05-18 NOTE — Telephone Encounter (Signed)
Kisa from Home health called to inform that physical therapy for pt will begin 05/19/2019. Follow up if needed  -[phone] p

## 2019-05-20 ENCOUNTER — Telehealth: Payer: Self-pay | Admitting: Internal Medicine

## 2019-05-20 NOTE — Telephone Encounter (Signed)
MA provided VO for 1 PT for 4 weeks.

## 2019-05-20 NOTE — Telephone Encounter (Signed)
Gennaro Africa @ [facility] [phone]. Need orders. Physical Therapy  once per week for 4 weeks.

## 2019-05-21 ENCOUNTER — Telehealth: Payer: Self-pay | Admitting: Internal Medicine

## 2019-05-21 NOTE — Telephone Encounter (Signed)
Could please schedule pt an in person visit(Morning) for hospital f/u

## 2019-05-21 NOTE — Telephone Encounter (Signed)
Will forward to pcp

## 2019-05-21 NOTE — Telephone Encounter (Signed)
Tonya [facility] treats patient for physical therapy and noticed patients shoulder wound, she called to request more information about the treatment plan for the patients wound. Please follow up when possible   Kenney Houseman [phone]

## 2019-05-26 ENCOUNTER — Telehealth: Payer: Self-pay | Admitting: Internal Medicine

## 2019-05-26 NOTE — Telephone Encounter (Signed)
ELAINE from [facility] this is the second week at the home.  Report Today BP elevated 162/80 while sitting both arms equal, other vitals normal.  [phone] Margaretha Sheffield.  No verbal orders  Call pt directly if need to give instructions.  Margaretha Sheffield can only provide additional info if needed.

## 2019-05-26 NOTE — Telephone Encounter (Signed)
Pt is on my schedule Thursday morning for an INR.

## 2019-05-26 NOTE — Telephone Encounter (Signed)
Per pt, He was in a lot of pain and has been in lots of pain these last couple of days. Patient states he is taking his pain mediation as prescribed. Today his pain was 9/10 when his blood pressure was taken. He states the PT rechecked his blood pressure and got 142/80 today.   He was able to call his medication by name and states he takes the medication at night d/t the way it makes him feel during the day.   Also he wanted to see if he could come in to see [facility] for his PT/INR. He states he has had some dizzy episodes. When he gets dizzy, it's usually d/t  His PT.    Please advise

## 2019-05-27 NOTE — Telephone Encounter (Signed)
yahir could you call pt and schedule an in person visit

## 2019-05-28 ENCOUNTER — Other Ambulatory Visit: Payer: Self-pay

## 2019-05-28 ENCOUNTER — Ambulatory Visit: Payer: Medicaid Other | Attending: Family Medicine | Admitting: Pharmacist

## 2019-05-28 DIAGNOSIS — Z86711 Personal history of pulmonary embolism: Secondary | ICD-10-CM | POA: Insufficient documentation

## 2019-05-28 LAB — POCT INR: INR: 2.2 (ref 2.0–3.0)

## 2019-06-02 ENCOUNTER — Other Ambulatory Visit: Payer: Self-pay

## 2019-06-02 ENCOUNTER — Encounter (HOSPITAL_COMMUNITY): Payer: Self-pay | Admitting: Emergency Medicine

## 2019-06-02 ENCOUNTER — Emergency Department (HOSPITAL_COMMUNITY)
Admission: EM | Admit: 2019-06-02 | Discharge: 2019-06-02 | Disposition: A | Discharge status: Home / Self Care | Payer: Medicaid Other | Attending: Emergency Medicine | Admitting: Emergency Medicine

## 2019-06-02 DIAGNOSIS — E1122 Type 2 diabetes mellitus with diabetic chronic kidney disease: Secondary | ICD-10-CM | POA: Diagnosis not present

## 2019-06-02 DIAGNOSIS — R569 Unspecified convulsions: Secondary | ICD-10-CM | POA: Diagnosis present

## 2019-06-02 DIAGNOSIS — R51 Headache: Secondary | ICD-10-CM | POA: Diagnosis not present

## 2019-06-02 DIAGNOSIS — Z79899 Other long term (current) drug therapy: Secondary | ICD-10-CM | POA: Insufficient documentation

## 2019-06-02 DIAGNOSIS — N183 Chronic kidney disease, stage 3 (moderate): Secondary | ICD-10-CM | POA: Insufficient documentation

## 2019-06-02 DIAGNOSIS — Z7982 Long term (current) use of aspirin: Secondary | ICD-10-CM | POA: Insufficient documentation

## 2019-06-02 DIAGNOSIS — I2510 Atherosclerotic heart disease of native coronary artery without angina pectoris: Secondary | ICD-10-CM | POA: Insufficient documentation

## 2019-06-02 DIAGNOSIS — G40909 Epilepsy, unspecified, not intractable, without status epilepticus: Secondary | ICD-10-CM | POA: Diagnosis not present

## 2019-06-02 DIAGNOSIS — I129 Hypertensive chronic kidney disease with stage 1 through stage 4 chronic kidney disease, or unspecified chronic kidney disease: Secondary | ICD-10-CM | POA: Insufficient documentation

## 2019-06-02 DIAGNOSIS — Z658 Other specified problems related to psychosocial circumstances: Secondary | ICD-10-CM | POA: Diagnosis not present

## 2019-06-02 LAB — I-STAT CHEM 8, ED
BUN: 13 mg/dL (ref 8–23)
Calcium, Ion: 1.22 mmol/L (ref 1.15–1.40)
Chloride: 104 mmol/L (ref 98–111)
Creatinine, Ser: 1.1 mg/dL (ref 0.61–1.24)
Glucose, Bld: 96 mg/dL (ref 70–99)
HCT: 46 % (ref 39.0–52.0)
Hemoglobin: 15.6 g/dL (ref 13.0–17.0)
Potassium: 4.1 mmol/L (ref 3.5–5.1)
Sodium: 140 mmol/L (ref 135–145)
TCO2: 23 mmol/L (ref 22–32)

## 2019-06-02 LAB — CBC WITH DIFFERENTIAL/PLATELET
Abs Immature Granulocytes: 0.01 10*3/uL (ref 0.00–0.07)
Basophils Absolute: 0 10*3/uL (ref 0.0–0.1)
Basophils Relative: 1 %
Eosinophils Absolute: 0.1 10*3/uL (ref 0.0–0.5)
Eosinophils Relative: 1 %
HCT: 47.8 % (ref 39.0–52.0)
Hemoglobin: 15.7 g/dL (ref 13.0–17.0)
Immature Granulocytes: 0 %
Lymphocytes Relative: 42 %
Lymphs Abs: 2.2 10*3/uL (ref 0.7–4.0)
MCH: 28.9 pg (ref 26.0–34.0)
MCHC: 32.8 g/dL (ref 30.0–36.0)
MCV: 87.9 fL (ref 80.0–100.0)
Monocytes Absolute: 0.5 10*3/uL (ref 0.1–1.0)
Monocytes Relative: 10 %
Neutro Abs: 2.5 10*3/uL (ref 1.7–7.7)
Neutrophils Relative %: 46 %
Platelets: 269 10*3/uL (ref 150–400)
RBC: 5.44 MIL/uL (ref 4.22–5.81)
RDW: 13.8 % (ref 11.5–15.5)
WBC: 5.4 10*3/uL (ref 4.0–10.5)
nRBC: 0 % (ref 0.0–0.2)

## 2019-06-02 LAB — COMPREHENSIVE METABOLIC PANEL
ALT: 28 U/L (ref 0–44)
AST: 49 U/L — ABNORMAL HIGH (ref 15–41)
Albumin: 3.9 g/dL (ref 3.5–5.0)
Alkaline Phosphatase: 50 U/L (ref 38–126)
Anion gap: 13 (ref 5–15)
BUN: 13 mg/dL (ref 8–23)
CO2: 16 mmol/L — ABNORMAL LOW (ref 22–32)
Calcium: 8.9 mg/dL (ref 8.9–10.3)
Chloride: 106 mmol/L (ref 98–111)
Creatinine, Ser: 1.07 mg/dL (ref 0.61–1.24)
GFR calc Af Amer: >60 mL/min (ref >60)
GFR calc non Af Amer: >60 mL/min (ref >60)
Glucose, Bld: 102 mg/dL — ABNORMAL HIGH (ref 70–99)
Potassium: 5.6 mmol/L — ABNORMAL HIGH (ref 3.5–5.1)
Sodium: 135 mmol/L (ref 135–145)
Total Bilirubin: 0.8 mg/dL (ref 0.3–1.2)
Total Protein: 7 g/dL (ref 6.5–8.1)

## 2019-06-02 LAB — PROTIME-INR
INR: 2.5 — ABNORMAL HIGH (ref 0.8–1.2)
Prothrombin Time: 26.6 seconds — ABNORMAL HIGH (ref 11.4–15.2)

## 2019-06-02 LAB — CBG MONITORING, ED: Glucose-Capillary: 93 mg/dL (ref 70–99)

## 2019-06-02 MED ORDER — DIVALPROEX SODIUM 500 MG PO DR TAB: DELAYED_RELEASE_TABLET | ORAL | 1 refills | Status: AC

## 2019-06-02 MED ORDER — LACTATED RINGERS IV BOLUS
1000.0000 mL | Freq: Once | INTRAVENOUS | Status: AC
  Administered 2019-06-02: 1000 mL via INTRAVENOUS

## 2019-06-02 MED ORDER — ACETAMINOPHEN 500 MG PO TABS
1000.0000 mg | ORAL_TABLET | Freq: Once | ORAL | Status: AC
  Administered 2019-06-02: 08:00:00 1000 mg via ORAL
  Filled 2019-06-02: qty 2

## 2019-06-02 MED ORDER — LACOSAMIDE 50 MG PO TABS
100.0000 mg | ORAL_TABLET | Freq: Once | ORAL | Status: AC
  Administered 2019-06-02: 09:00:00 100 mg via ORAL
  Filled 2019-06-02: qty 2

## 2019-06-02 NOTE — ED Notes (Signed)
Pt brought to the front desk by security from the [facility] waiting room where his family member is having surgery. OR called out for seizure and pt sent here. Pt convulsing with eyes wide open in wheel chair, responding to all stimuli, blinking eyes, withdrawing from pain. And when asked if he has seizures he said "yes" and stated his name while still shaking. Axox4.

## 2019-06-02 NOTE — ED Triage Notes (Signed)
Patient brought down from visiting family in the OR when stated he had seizure like activity. Patient alert and orientated x4. Patient states he feels weak and has a headache, unsure if he had a seizure.
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
## 2019-06-02 NOTE — Telephone Encounter (Signed)
I called the patient.  The patient apparently was taken off of Depakote when he was in the emergency room and hospital on 19 July, he was placed on Vimpat 100 mg twice daily.  He had another seizure type event today.  I was not consulted in regards to this medication change.  The patient will go back on Depakote taking 500 mg twice daily for 2 weeks then go to 500 mg in the morning and 1000 mg in the evening.  He will remain on Vimpat for now.

## 2019-06-04 ENCOUNTER — Other Ambulatory Visit: Payer: Self-pay

## 2019-06-04 ENCOUNTER — Encounter: Payer: Self-pay | Admitting: Internal Medicine

## 2019-06-04 ENCOUNTER — Ambulatory Visit (HOSPITAL_BASED_OUTPATIENT_CLINIC_OR_DEPARTMENT_OTHER): Payer: Medicaid Other | Admitting: Licensed Clinical Social Worker

## 2019-06-04 ENCOUNTER — Ambulatory Visit: Payer: Medicaid Other | Attending: Internal Medicine | Admitting: Internal Medicine

## 2019-06-04 VITALS — BP 141/84 | HR 64 | Temp 98.4°F | Resp 16 | Wt 207.6 lb

## 2019-06-04 DIAGNOSIS — N179 Acute kidney failure, unspecified: Secondary | ICD-10-CM | POA: Insufficient documentation

## 2019-06-04 DIAGNOSIS — E785 Hyperlipidemia, unspecified: Secondary | ICD-10-CM | POA: Diagnosis not present

## 2019-06-04 DIAGNOSIS — F332 Major depressive disorder, recurrent severe without psychotic features: Secondary | ICD-10-CM | POA: Diagnosis not present

## 2019-06-04 DIAGNOSIS — Z86711 Personal history of pulmonary embolism: Secondary | ICD-10-CM | POA: Insufficient documentation

## 2019-06-04 DIAGNOSIS — F419 Anxiety disorder, unspecified: Secondary | ICD-10-CM | POA: Diagnosis not present

## 2019-06-04 DIAGNOSIS — I2510 Atherosclerotic heart disease of native coronary artery without angina pectoris: Secondary | ICD-10-CM | POA: Diagnosis not present

## 2019-06-04 DIAGNOSIS — Z79899 Other long term (current) drug therapy: Secondary | ICD-10-CM | POA: Diagnosis not present

## 2019-06-04 DIAGNOSIS — Z8673 Personal history of transient ischemic attack (TIA), and cerebral infarction without residual deficits: Secondary | ICD-10-CM | POA: Insufficient documentation

## 2019-06-04 DIAGNOSIS — M4802 Spinal stenosis, cervical region: Secondary | ICD-10-CM | POA: Diagnosis not present

## 2019-06-04 DIAGNOSIS — F329 Major depressive disorder, single episode, unspecified: Secondary | ICD-10-CM | POA: Diagnosis not present

## 2019-06-04 DIAGNOSIS — M109 Gout, unspecified: Secondary | ICD-10-CM | POA: Insufficient documentation

## 2019-06-04 DIAGNOSIS — Z7901 Long term (current) use of anticoagulants: Secondary | ICD-10-CM | POA: Diagnosis not present

## 2019-06-04 DIAGNOSIS — E1122 Type 2 diabetes mellitus with diabetic chronic kidney disease: Secondary | ICD-10-CM | POA: Insufficient documentation

## 2019-06-04 DIAGNOSIS — D649 Anemia, unspecified: Secondary | ICD-10-CM | POA: Diagnosis not present

## 2019-06-04 DIAGNOSIS — Z6379 Other stressful life events affecting family and household: Secondary | ICD-10-CM

## 2019-06-04 DIAGNOSIS — G8929 Other chronic pain: Secondary | ICD-10-CM | POA: Diagnosis not present

## 2019-06-04 DIAGNOSIS — I10 Essential (primary) hypertension: Secondary | ICD-10-CM

## 2019-06-04 DIAGNOSIS — N183 Chronic kidney disease, stage 3 (moderate): Secondary | ICD-10-CM | POA: Diagnosis not present

## 2019-06-04 DIAGNOSIS — G40909 Epilepsy, unspecified, not intractable, without status epilepticus: Secondary | ICD-10-CM | POA: Insufficient documentation

## 2019-06-04 DIAGNOSIS — I129 Hypertensive chronic kidney disease with stage 1 through stage 4 chronic kidney disease, or unspecified chronic kidney disease: Secondary | ICD-10-CM | POA: Insufficient documentation

## 2019-06-04 DIAGNOSIS — Z7982 Long term (current) use of aspirin: Secondary | ICD-10-CM | POA: Insufficient documentation

## 2019-06-04 MED ORDER — HYDROXYZINE HCL 25 MG PO TABS: 25.0000 mg | ORAL_TABLET | Freq: Two times a day (BID) | ORAL | 2 refills | Status: DC | PRN

## 2019-06-04 MED FILL — hydrOXYzine HCL 25 MG TABS: 25 | 30 days supply | Qty: 30 | Fill #0
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
## 2019-06-04 NOTE — Progress Notes (Signed)
Pt states his pain is coming from his neck and spine
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
## 2019-06-11 ENCOUNTER — Encounter (HOSPITAL_COMMUNITY): Payer: Self-pay | Admitting: Emergency Medicine

## 2019-06-11 ENCOUNTER — Emergency Department (HOSPITAL_COMMUNITY): Payer: Medicaid Other

## 2019-06-11 ENCOUNTER — Emergency Department (HOSPITAL_COMMUNITY)
Admission: EM | Admit: 2019-06-11 | Discharge: 2019-06-12 | Disposition: A | Discharge status: Home / Self Care | Payer: Medicaid Other | Attending: Emergency Medicine | Admitting: Emergency Medicine

## 2019-06-11 ENCOUNTER — Other Ambulatory Visit: Payer: Self-pay

## 2019-06-11 DIAGNOSIS — Z7901 Long term (current) use of anticoagulants: Secondary | ICD-10-CM | POA: Insufficient documentation

## 2019-06-11 DIAGNOSIS — F4321 Adjustment disorder with depressed mood: Secondary | ICD-10-CM

## 2019-06-11 DIAGNOSIS — E1122 Type 2 diabetes mellitus with diabetic chronic kidney disease: Secondary | ICD-10-CM | POA: Diagnosis not present

## 2019-06-11 DIAGNOSIS — R569 Unspecified convulsions: Secondary | ICD-10-CM | POA: Insufficient documentation

## 2019-06-11 DIAGNOSIS — Z7982 Long term (current) use of aspirin: Secondary | ICD-10-CM | POA: Insufficient documentation

## 2019-06-11 DIAGNOSIS — I252 Old myocardial infarction: Secondary | ICD-10-CM | POA: Insufficient documentation

## 2019-06-11 DIAGNOSIS — Z79899 Other long term (current) drug therapy: Secondary | ICD-10-CM | POA: Diagnosis not present

## 2019-06-11 DIAGNOSIS — Z634 Disappearance and death of family member: Secondary | ICD-10-CM

## 2019-06-11 DIAGNOSIS — F4320 Adjustment disorder, unspecified: Secondary | ICD-10-CM | POA: Insufficient documentation

## 2019-06-11 DIAGNOSIS — N183 Chronic kidney disease, stage 3 (moderate): Secondary | ICD-10-CM | POA: Diagnosis not present

## 2019-06-11 DIAGNOSIS — I129 Hypertensive chronic kidney disease with stage 1 through stage 4 chronic kidney disease, or unspecified chronic kidney disease: Secondary | ICD-10-CM | POA: Insufficient documentation

## 2019-06-11 DIAGNOSIS — I2510 Atherosclerotic heart disease of native coronary artery without angina pectoris: Secondary | ICD-10-CM | POA: Insufficient documentation

## 2019-06-11 LAB — COMPREHENSIVE METABOLIC PANEL
ALT: 25 U/L (ref 0–44)
AST: 37 U/L (ref 15–41)
Albumin: 3.8 g/dL (ref 3.5–5.0)
Alkaline Phosphatase: 42 U/L (ref 38–126)
Anion gap: 9 (ref 5–15)
BUN: 9 mg/dL (ref 8–23)
CO2: 20 mmol/L — ABNORMAL LOW (ref 22–32)
Calcium: 8.7 mg/dL — ABNORMAL LOW (ref 8.9–10.3)
Chloride: 108 mmol/L (ref 98–111)
Creatinine, Ser: 1.26 mg/dL — ABNORMAL HIGH (ref 0.61–1.24)
GFR calc Af Amer: >60 mL/min (ref >60)
GFR calc non Af Amer: 60 mL/min — ABNORMAL LOW (ref >60)
Glucose, Bld: 107 mg/dL — ABNORMAL HIGH (ref 70–99)
Potassium: 3.9 mmol/L (ref 3.5–5.1)
Sodium: 137 mmol/L (ref 135–145)
Total Bilirubin: 0.5 mg/dL (ref 0.3–1.2)
Total Protein: 7.5 g/dL (ref 6.5–8.1)

## 2019-06-11 LAB — CBG MONITORING, ED: Glucose-Capillary: 97 mg/dL (ref 70–99)

## 2019-06-11 LAB — CBC WITH DIFFERENTIAL/PLATELET
Abs Immature Granulocytes: 0.01 10*3/uL (ref 0.00–0.07)
Basophils Absolute: 0 10*3/uL (ref 0.0–0.1)
Basophils Relative: 1 %
Eosinophils Absolute: 0.1 10*3/uL (ref 0.0–0.5)
Eosinophils Relative: 1 %
HCT: 48.5 % (ref 39.0–52.0)
Hemoglobin: 16 g/dL (ref 13.0–17.0)
Immature Granulocytes: 0 %
Lymphocytes Relative: 46 %
Lymphs Abs: 2 10*3/uL (ref 0.7–4.0)
MCH: 29.4 pg (ref 26.0–34.0)
MCHC: 33 g/dL (ref 30.0–36.0)
MCV: 89.2 fL (ref 80.0–100.0)
Monocytes Absolute: 0.4 10*3/uL (ref 0.1–1.0)
Monocytes Relative: 10 %
Neutro Abs: 1.8 10*3/uL (ref 1.7–7.7)
Neutrophils Relative %: 42 %
Platelets: 220 10*3/uL (ref 150–400)
RBC: 5.44 MIL/uL (ref 4.22–5.81)
RDW: 14.2 % (ref 11.5–15.5)
WBC: 4.3 10*3/uL (ref 4.0–10.5)
nRBC: 0 % (ref 0.0–0.2)

## 2019-06-11 LAB — PROTIME-INR
INR: 3.3 — ABNORMAL HIGH (ref 0.8–1.2)
Prothrombin Time: 33 seconds — ABNORMAL HIGH (ref 11.4–15.2)

## 2019-06-11 LAB — MAGNESIUM: Magnesium: 2 mg/dL (ref 1.7–2.4)

## 2019-06-11 LAB — ETHANOL: Alcohol, Ethyl (B): 75 mg/dL — ABNORMAL HIGH (ref <10)

## 2019-06-11 MED ORDER — SODIUM CHLORIDE 0.9 % IV SOLN
INTRAVENOUS | Status: DC
  Administered 2019-06-11: 21:00:00 via INTRAVENOUS

## 2019-06-11 MED ORDER — SODIUM CHLORIDE 0.9 % IV BOLUS
1000.0000 mL | Freq: Once | INTRAVENOUS | Status: AC
  Administered 2019-06-11: 21:00:00 1000 mL via INTRAVENOUS

## 2019-06-11 NOTE — ED Notes (Signed)
Patients son had to leave for a few minutes but is aware pt up for dc and will be right back to pick him up.

## 2019-06-11 NOTE — ED Provider Notes (Signed)
[facility] DEPT Provider Note   CSN: [id number] Arrival date & time: 06/11/19  1902     History   Chief Complaint Chief Complaint  Patient presents with  . Seizures    HPI Russell Carter is a 63 y.o. male.     Pt presents to the ED today with seizures.  The pt has a hx of seizure d/o and had been on Depakote.  The pt was seen in the ED on 8/11 for seizures and was hospitalized from 7/19 to 24 for seizures.  While there, he was changed to vimpat.  No med changes on last ED visit.  The pt has been under tremendous stress recently.  His daughter was shot around 04-Jun-2023 and died in the ICU at [facility] just a few days ago.  He has been drinking heavily today, and is unable to tell me if he's been taking his meds.  EMS gave him 5 mg of versed IM en route and 2.5 mg IV when he arrived.  They said he had 5 seizures en route, but did not seem post ictal.  Pt is also on coumadin for hx of PE.  Per PCP's note, Dr. Jannifer Franklin has told pt to go back to depakote, but pt has not done this yet.     Past Medical History:  Diagnosis Date  . Acute kidney injury ([place]) 04/27/2018  . AKI (acute kidney injury) ([place])   . Anginal pain ([place])   . Anxiety   . Arthritis   . Bipolar disorder ([place])   . Blood in stool   . CAD (coronary artery disease)    a. 2011: cath showing mod non-obstructive disease b. 02/2016: cath with 95% stenosis in the Mid RCA. DES placed.  . Cervical stenosis of spine   . Chronic anticoagulation 04/27/2018  . Chronic pain disorder 06/05/2017  . CKD (chronic kidney disease), stage III ([place]) 05/06/2018  . Cocaine abuse ([place])    history  . Complete heart block ([place]) 03/14/2016  . Depression   . Diabetes mellitus type 2 in nonobese (HCC)   . Diabetes mellitus without complication ([place])   . Dyslipidemia 03/15/2016  . Essential hypertension 05/18/2009   Qualifier: Diagnosis of  By: Burnett Kanaris    . Gastroesophageal reflux disease without esophagitis  07/31/2017  . Gout, unspecified   . Headache   . History of CVA (cerebrovascular accident)   . History of pulmonary embolus (PE) 02/14/2017  . HTN (hypertension)   . Hypercholesterolemia   . Hyperglycemia   . Leukopenia 05/06/2018  . Migraines   . Myocardial infarction [facility]) 2008   drug cocaine and marijuana at time of mi none  since    . Normocytic anemia 05/06/2018  . Pneumonia   . Polysubstance abuse ([place]) 01/15/2013  . Rectal bleeding   . Renal insufficiency   . Seizure ([place]) 05/06/2018  . Seizures ([place])   . Stroke ([place])   . Vertigo     Patient Active Problem List   Diagnosis Date Noted  . Stroke ([place]) 12/24/2018  . CKD (chronic kidney disease), stage III ([place]) 05/06/2018  . Seizure ([place]) 05/06/2018  . Normocytic anemia 05/06/2018  . Leukopenia 05/06/2018  . AKI (acute kidney injury) ([place])   . Acute kidney injury ([place]) 04/27/2018  . Abdominal pain 04/27/2018  . Chronic anticoagulation 04/27/2018  . Left knee pain 01/10/2018  . Numbness and tingling in both hands 07/31/2017  . Gastroesophageal reflux disease without esophagitis 07/31/2017  . Chronic pain  disorder 06/05/2017  . History of pulmonary embolus (PE) 02/14/2017  . Diabetes mellitus type 2 in nonobese (HCC)   . Cocaine abuse ([place])   . Depression   . History of CVA (cerebrovascular accident)   . Cervical spondylosis with radiculopathy 01/24/2017  . Dyslipidemia 03/15/2016  . Cervical spinal stenosis 03/14/2016  . CAD-50% LAD 2011 12/14/2014  . Gout 11/16/2014  . Polysubstance abuse ([place]) 01/15/2013  . DIZZINESS 06/05/2010  . Essential hypertension 05/18/2009    Past Surgical History:  Procedure Laterality Date  . ANTERIOR CERVICAL DECOMP/DISCECTOMY FUSION N/A 01/24/2017   Procedure: Cervical Four-Five,Cervical Five Six,Cervical Six-Seven Anterior cervical discectomy with fusion and plate with Cervical Three-Thoracic- One Laminectomy/Foraminotomy with Cervical Three-Thoracic- One Fixation and fusion;   Surgeon: Kevan Ny Ditty, MD;  Location: [facility];  Service: Neurosurgery;  Laterality: N/A;  Part-1 anterior approach  . CARDIAC CATHETERIZATION  2008; ~ 2011   [place]-40%lad  . CARDIAC CATHETERIZATION N/A 03/16/2016   Procedure: Left Heart Cath and Coronary Angiography;  Surgeon: Leonie Man, MD;  Location: [facility] CV LAB;  Service: Cardiovascular;  Laterality: N/A;  . COLONOSCOPY WITH PROPOFOL N/A 04/17/2018   Procedure: COLONOSCOPY WITH PROPOFOL;  Surgeon: Wonda Horner, MD;  Location: [facility] ENDOSCOPY;  Service: Endoscopy;  Laterality: N/A;  . CORONARY ANGIOPLASTY     STENTS  . EYE SURGERY Bilateral    "for double vision"  . INGUINAL HERNIA REPAIR  05/12/2012   Procedure: HERNIA REPAIR INGUINAL ADULT;  Surgeon: Zenovia Jarred, MD;  Location: [facility];  Service: General;  Laterality: Right;  . INGUINAL HERNIA REPAIR Left 02/02/2003   Dr. Georganna Skeans. repair with mesh. Same procedure done again in  05/22/2004   . INGUINAL HERNIA REPAIR Left ?date 2nd OR  . LEFT HEART CATH AND CORONARY ANGIOGRAPHY N/A 04/29/2018   Procedure: LEFT HEART CATH AND CORONARY ANGIOGRAPHY;  Surgeon: Martinique, Peter M, MD;  Location: [facility] CV LAB;  Service: Cardiovascular;  Laterality: N/A;  . POSTERIOR CERVICAL FUSION/FORAMINOTOMY N/A 01/24/2017   Procedure: Cervical Three-Thoracic One-Laminectomy/Foraminotomy with Cervical Three-Thoracic One Fixation and fusion;  Surgeon: Kevan Ny Ditty, MD;  Location: [facility];  Service: Neurosurgery;  Laterality: N/A;  Part-2 Posterior approach  . RECONSTRUCT / STABILIZE DISTAL ULNA    . SHOULDER OPEN ROTATOR CUFF REPAIR Left 2010  . SHOULDER SURGERY Left    "injured on the job; had to cut small bone out"  . THUMB FUSION Right         Home Medications    Prior to Admission medications   Medication Sig Start Date End Date Taking? Authorizing Provider  allopurinol (ZYLOPRIM) 300 MG tablet Take 1 tablet (300 mg total) by mouth daily. 02/18/19   Yes Ladell Pier, MD  amLODipine (NORVASC) 5 MG tablet Take 1 tablet (5 mg total) by mouth daily. 12/18/18  Yes Ladell Pier, MD  divalproex (DEPAKOTE) 500 MG DR tablet 1 tablet in the morning, 2 in the evening 06/02/19  Yes Kathrynn Ducking, MD  DULoxetine (CYMBALTA) 30 MG capsule Take 1 capsule (30 mg total) by mouth 2 (two) times daily. 01/20/19  Yes Ladell Pier, MD  folic acid (FOLVITE) 1 MG tablet Take 1 tablet (1 mg total) by mouth daily. 05/07/18  Yes Emokpae, Courage, MD  gabapentin (NEURONTIN) 100 MG capsule Take 2 capsules (200 mg total) by mouth 2 (two) times daily. Patient taking differently: Take 100 mg by mouth 2 (two) times daily.  05/15/19 04/04/20 Yes Domenic Polite,  MD  lacosamide 100 MG TABS Take 1 tablet (100 mg total) by mouth 2 (two) times daily. 05/15/19 04/04/20 Yes Domenic Polite, MD  losartan (COZAAR) 100 MG tablet Take 1 tablet (100 mg total) by mouth daily. 12/18/18  Yes Ladell Pier, MD  meclizine (ANTIVERT) 25 MG tablet Take 25 mg by mouth daily as needed for dizziness.   Yes [provider]  methocarbamol (ROBAXIN) 750 MG tablet Take 1 tablet (750 mg total) by mouth every 8 (eight) hours as needed for muscle spasms. 05/07/18  Yes Emokpae, Courage, MD  rosuvastatin (CRESTOR) 40 MG tablet Take 1 tablet (40 mg total) by mouth daily. 04/21/19  Yes Ladell Pier, MD  testosterone cypionate (DEPOTESTOTERONE CYPIONATE) 100 MG/ML injection Inject 200 mg into the muscle every 28 (twenty-eight) days. For IM use only   Yes [provider]  warfarin (COUMADIN) 5 MG tablet Take 2.5-5 mg by mouth See admin instructions. Take 1 tablet (107m) by mouth on Mon, Wed, and Fri. Take 1/2 tablet (2.563m on Sun, Tues, Thurs, and Sat. 01/01/19  Yes [provider]  aspirin EC 81 MG tablet Take 1 tablet (81 mg total) by mouth daily. 12/26/18 12/26/19  WiVonzella NippleNP  Calcium Carb-Cholecalciferol (CALCIUM-VITAMIN D) 600-400 MG-UNIT TABS Take 1  tablet by mouth daily. 05/07/18   EmRoxan HockeyMD  hydrOXYzine (ATARAX/VISTARIL) 25 MG tablet Take 1 tablet (25 mg total) by mouth 2 (two) times daily as needed. 06/04/19   JoLadell PierMD  Multiple Vitamin (MULTIVITAMIN WITH MINERALS) TABS tablet Take 1 tablet by mouth daily. 05/07/18   EmRoxan HockeyMD  NARCAN 4 MG/0.1ML LIQD nasal spray kit Place 1 spray into the nose once.  02/12/19   [provider]  nitroGLYCERIN (NITROSTAT) 0.4 MG SL tablet Place 1 tablet (0.4 mg total) under the tongue every 5 (five) minutes as needed for chest pain. 02/20/19   JoLadell PierMD  oxyCODONE-acetaminophen (PERCOCET) 7.5-325 MG tablet Take 1 tablet by mouth 3 (three) times daily. 12/26/18 12/26/19  WiVonzella NippleNP  tamsulosin (FLOMAX) 0.4 MG CAPS capsule Take 0.4 mg by mouth daily. 02/18/19   [provider]  Vitamin D, Ergocalciferol, (DRISDOL) 1.25 MG (50000 UT) CAPS capsule Take 50,000 Units by mouth every Sunday.     [provider]    Family History Family History  Problem Relation Age of Onset  . Heart attack Mother        x7. Still alive  . Heart attack Father        deceased b/c of MI  . Heart attack Brother        older, deceased b/c of MI   . Cancer Brother        bone cancer in 1 brother, ? brain cancer in other brother  . Heart attack Brother   . Other Brother        MVA  . Other Brother   . Bone cancer Brother   . Cancer Brother   . SIDS Sister     Social History Social History   Tobacco Use  . Smoking status: Never Smoker  . Smokeless tobacco: Never Used  Substance Use Topics  . Alcohol use: Not Currently    Comment: 16 oz beer every 2-3 days- states he does not drink  . Drug use: Not Currently    Types: "Crack" cocaine, Marijuana, Cocaine    Comment:  "quit 2008"     Allergies   Shellfish allergy  Review of Systems Review of Systems  Neurological: Positive for seizures.  All other systems reviewed and are negative.     Physical Exam Updated Vital Signs BP 129/83   Pulse 85   Temp 98.9 F (37.2 C) (Oral)   Resp 19   Ht 5' 11.5" (1.816 m)   Wt 93.9 kg   SpO2 96%   BMI 28.47 kg/m   Physical Exam Vitals signs and nursing note reviewed.  Constitutional:      Appearance: Normal appearance.  HENT:     Head: Normocephalic and atraumatic.     Right Ear: External ear normal.     Left Ear: External ear normal.     Nose: Nose normal.     Mouth/Throat:     Mouth: Mucous membranes are moist.     Pharynx: Oropharynx is clear.  Eyes:     Extraocular Movements: Extraocular movements intact.     Conjunctiva/sclera: Conjunctivae normal.     Pupils: Pupils are equal, round, and reactive to light.  Neck:     Musculoskeletal: Normal range of motion and neck supple.  Cardiovascular:     Rate and Rhythm: Normal rate and regular rhythm.     Pulses: Normal pulses.     Heart sounds: Normal heart sounds.  Pulmonary:     Effort: Pulmonary effort is normal.     Breath sounds: Normal breath sounds.  Abdominal:     General: Abdomen is flat. Bowel sounds are normal.     Palpations: Abdomen is soft.  Musculoskeletal: Normal range of motion.  Skin:    General: Skin is warm and dry.     Capillary Refill: Capillary refill takes less than 2 seconds.  Neurological:     General: No focal deficit present.     Comments: Pt is somnolent from the versed, but will wake up and talk briefly.  He is moving all 4 extremities.      ED Treatments / Results  Labs (all labs ordered are listed, but only abnormal results are displayed) Labs Reviewed  COMPREHENSIVE METABOLIC PANEL - Abnormal; Notable for the following components:      Result Value   CO2 20 (*)    Glucose, Bld 107 (*)    Creatinine, Ser 1.26 (*)    Calcium 8.7 (*)    GFR calc non Af Amer 60 (*)    All other components within normal limits  ETHANOL - Abnormal; Notable for the following components:   Alcohol, Ethyl (B) 75 (*)    All other components  within normal limits  PROTIME-INR - Abnormal; Notable for the following components:   Prothrombin Time 33.0 (*)    INR 3.3 (*)    All other components within normal limits  CBC WITH DIFFERENTIAL/PLATELET  MAGNESIUM  URINALYSIS, ROUTINE W REFLEX MICROSCOPIC  RAPID URINE DRUG SCREEN, HOSP PERFORMED  CBG MONITORING, ED    EKG None  Radiology Ct Head Wo Contrast  Result Date: 06/11/2019 CLINICAL DATA:  Seizure EXAM: CT HEAD WITHOUT CONTRAST TECHNIQUE: Contiguous axial images were obtained from the base of the skull through the vertex without intravenous contrast. COMPARISON:  CT brain 05/11/2019, 04/13/2019, MRI 12/25/2018 FINDINGS: Brain: No acute territorial infarction, hemorrhage or intracranial mass. Mild small vessel ischemic changes of the white matter. Minimal atrophy. Stable ventricle size. Vascular: No hyperdense vessels.  No unexpected calcification Skull: Normal. Negative for fracture or focal lesion. Sinuses/Orbits: Mucosal thickening in the ethmoid sinuses. Other: None IMPRESSION: 1. No CT evidence for acute intracranial abnormality.  2. Mild small vessel ischemic changes of the white matter Electronically Signed   By: Donavan Foil M.D.   On: 06/11/2019 20:36   Dg Chest Port 1 View  Result Date: 06/11/2019 CLINICAL DATA:  Seizure. EXAM: PORTABLE CHEST 1 VIEW COMPARISON:  05/10/2019 and prior radiograph FINDINGS: This is a low volume study. The cardiomediastinal silhouette is unremarkable. Mild bibasilar atelectasis noted. There is no evidence of focal airspace disease, pulmonary edema, suspicious pulmonary nodule/mass, pleural effusion, or pneumothorax. No acute bony abnormalities are identified. Cervical fusion hardware again noted. IMPRESSION: Low volume film with mild bibasilar atelectasis. Electronically Signed   By: Margarette Canada M.D.   On: 06/11/2019 19:33    Procedures Procedures (including critical care time)  Medications Ordered in ED Medications  sodium chloride 0.9 %  bolus 1,000 mL (1,000 mLs Intravenous New Bag/Given 06/11/19 2043)    And  0.9 %  sodium chloride infusion ( Intravenous New Bag/Given 06/11/19 2042)     Initial Impression / Assessment and Plan / ED Course  I have reviewed the triage vital signs and the nursing notes.  Pertinent labs & imaging results that were available during my care of the patient were reviewed by me and considered in my medical decision making (see chart for details).     Pt is back to his normal ms.  Pt had 2 "seizures" while here which were pseudoseizures.  The pt is encouraged to not drink alcohol and to f/u with Dr. Jannifer Franklin (neurology).    Final Clinical Impressions(s) / ED Diagnoses   Final diagnoses:  Seizure ([place])  Grief at loss of child    ED Discharge Orders    None       Isla Pence, MD 06/11/19 2308

## 2019-06-11 NOTE — ED Triage Notes (Signed)
Patient BIB [facility] from home for seizure like activity. Pt had 4 seizure like episodes in the presence of EMS, lasting about 15-20 seconds each.EMS gave 5mg  versed IM and another 2.5 versed IV.  Pt postictal after the first 2. Pt a/o x4 shortly after last 2 seizure episodes. Pt has known seizure disorder, EMS unsure abt pt compliance with medication. EMS states pt has significant cardiac hx and takes warfarin. Unknown if pt fell at any time prior to EMS arrival. Family reports pt under extreme  amt of stress, daughter recently passed away.

## 2019-06-11 NOTE — ED Notes (Signed)
Pt shaking bed rail and stating he is having a seizure.

## 2019-06-11 NOTE — Discharge Instructions (Signed)
Do not drink alcohol.

## 2019-06-11 NOTE — ED Notes (Signed)
Patient given another blanket and reminded a urine sample is needed as soon as possible.

## 2019-06-15 ENCOUNTER — Telehealth: Payer: Self-pay

## 2019-06-15 NOTE — Telephone Encounter (Signed)
Pt has been scheduled for 06/23/2019 at 2 pm with Debbora Presto, NP.

## 2019-06-15 NOTE — Telephone Encounter (Signed)
-----   Message from Kathrynn Ducking, MD sent at 06/14/2019  7:17 PM EDT ----- This patient has had several ER visits for seizures, he will need a revisit sometime in the next several weeks, okay to see nurse practitioner.

## 2019-06-23 ENCOUNTER — Ambulatory Visit: Payer: Medicaid Other | Admitting: Family Medicine

## 2019-06-23 ENCOUNTER — Other Ambulatory Visit: Payer: Self-pay

## 2019-06-23 ENCOUNTER — Encounter: Payer: Self-pay | Admitting: Family Medicine

## 2019-06-23 VITALS — BP 135/86 | HR 68 | Temp 96.9°F | Ht 71.5 in | Wt 212.0 lb

## 2019-06-23 DIAGNOSIS — R569 Unspecified convulsions: Secondary | ICD-10-CM

## 2019-06-23 NOTE — Patient Instructions (Addendum)
Continue Vimpat and Depakote as prescribed  Labs today  Follow up in 3 months   Seizure, Adult A seizure is a sudden burst of abnormal electrical activity in the brain. Seizures usually last from 30 seconds to 2 minutes. They can cause many different symptoms. Usually, seizures are not harmful unless they last a long time. What are the causes? Common causes of this condition include:  Fever or infection.  Conditions that affect the brain, such as: ? A brain abnormality that you were born with. ? A brain or head injury. ? Bleeding in the brain. ? A tumor. ? Stroke. ? Brain disorders such as autism or cerebral palsy.  Low blood sugar.  Conditions that are passed from parent to child (are inherited).  Problems with substances, such as: ? Having a reaction to a drug or a medicine. ? Suddenly stopping the use of a substance (withdrawal). In some cases, the cause may not be known. A person who has repeated seizures over time without a clear cause has a condition called epilepsy. What increases the risk? You are more likely to get this condition if you have:  A family history of epilepsy.  Had a seizure in the past.  A brain disorder.  A history of head injury, lack of oxygen at birth, or strokes. What are the signs or symptoms? There are many types of seizures. The symptoms vary depending on the type of seizure you have. Examples of symptoms during a seizure include:  Shaking (convulsions).  Stiffness in the body.  Passing out (losing consciousness).  Head nodding.  Staring.  Not responding to sound or touch.  Loss of bladder control and bowel control. Some people have symptoms right before and right after a seizure happens. Symptoms before a seizure may include:  Fear.  Worry (anxiety).  Feeling like you may vomit (nauseous).  Feeling like the room is spinning (vertigo).  Feeling like you saw or heard something before (dj vu).  Odd tastes or smells.   Changes in how you see. You may see flashing lights or spots. Symptoms after a seizure happens can include:  Confusion.  Sleepiness.  Headache.  Weakness on one side of the body. How is this treated? Most seizures will stop on their own in under 5 minutes. In these cases, no treatment is needed. Seizures that last longer than 5 minutes will usually need treatment. Treatment can include:  Medicines given through an IV tube.  Avoiding things that are known to cause your seizures. These can include medicines that you take for another condition.  Medicines to treat epilepsy.  Surgery to stop the seizures. This may be needed if medicines do not help. Follow these instructions at home: Medicines  Take over-the-counter and prescription medicines only as told by your doctor.  Do not eat or drink anything that may keep your medicine from working, such as alcohol. Activity  Do not do any activities that would be dangerous if you had another seizure, like driving or swimming. Wait until your doctor says it is safe for you to do them.  If you live in the [place] ask your local DMV (department of motor vehicles) when you can drive.  Get plenty of rest. Teaching others Teach friends and family what to do when you have a seizure. They should:  Lay you on the ground.  Protect your head and body.  Loosen any tight clothing around your neck.  Turn you on your side.  Not hold you down.  Not put anything into your mouth.  Know whether or not you need emergency care.  Stay with you until you are better.  General instructions  Contact your doctor each time you have a seizure.  Avoid anything that gives you seizures.  Keep a seizure diary. Write down: ? What you think caused each seizure. ? What you remember about each seizure.  Keep all follow-up visits as told by your doctor. This is important. Contact a doctor if:  You have another seizure.  You have seizures more  often.  There is any change in what happens during your seizures.  You keep having seizures with treatment.  You have symptoms of being sick or having an infection. Get help right away if:  You have a seizure that: ? Lasts longer than 5 minutes. ? Is different than seizures you had before. ? Makes it harder to breathe. ? Happens after you hurt your head.  You have any of these symptoms after a seizure: ? Not being able to speak. ? Not being able to use a part of your body. ? Confusion. ? A bad headache.  You have two or more seizures in a row.  You do not wake up right after a seizure.  You get hurt during a seizure. These symptoms may be an emergency. Do not wait to see if the symptoms will go away. Get medical help right away. Call your local emergency services (911 in the [place] Do not drive yourself to the hospital. Summary  Seizures usually last from 30 seconds to 2 minutes. Usually, they are not harmful unless they last a long time.  Do not eat or drink anything that may keep your medicine from working, such as alcohol.  Teach friends and family what to do when you have a seizure.  Contact your doctor each time you have a seizure. This information is not intended to replace advice given to you by your health care provider. Make sure you discuss any questions you have with your health care provider. Document Released: 03/26/2008 Document Revised: 12/26/2018 Document Reviewed: 12/26/2018 Elsevier Patient Education  [place].

## 2019-06-23 NOTE — Progress Notes (Signed)
PATIENT: Rueben Yano DOB: 24-Nov-1955  REASON FOR VISIT: follow up HISTORY FROM: patient  Chief Complaint  Patient presents with  . Follow-up    Seizure. Alone.. Rm 8. Patient mentioned that he has been having headaches.      HISTORY OF PRESENT ILLNESS: Today 06/23/19 Tilak Harb is a 63 y.o. male here today for follow up for increased seizure activity. He has been seen multiple times in the ER for seizure activity/? syncopal events over the past 6 months. Last seizure 8/20. There was some question about change in AED's. He reports taking Vimpat 15m twice daily as well as Depakote 5032mtwice daily for the past month. Last dose of each was last night. He denies previously missed doses. He did not increase Depakote as recommended by Dr WiJannifer Franklinn 02/2019. He does note a headache prior to seizure activity.  He lost his daughter recently due to [place] admits to drinking pretty heavily around that time.  He states that he has not had any alcohol since his daughter's funeral 2 weeks ago.  He feels that he has a good support system with his brother, mom and fiance. He denies illicit drug use. He does not drive.   HISTORY: (copied from Dr WiJannifer Franklinnote on 03/19/2019)  MiAydieninor is a [age]ear old right-handed black male with a history of seizures.  The patient is on Depakote currently taking 500 mg twice daily.  He does have a history of polysubstance abuse as well.  He recently was admitted to the hospital on 24 December 2018 with a syncopal event at home, patient claims that he was unconscious for about 15 minutes.  He was noted to have left-sided weakness and a left homonymous visual field deficit, he underwent a CT scan and CT angiogram and then was having TPA.  The patient eventually had MRI of the brain that did not confirm the presence of a stroke.  2D echocardiogram and EEG study were unremarkable.  The patient claims that he has some residual left-sided vision alteration and some left-sided  weakness.  He has chronic left arm weakness are related to cervical radiculopathy issues.  The patient still has headaches once or twice a week, he will take extra strength Tylenol for this with good improvement.  The patient has not had any further seizure type events since last seen in the office about 6 months ago.  He is tolerating the Depakote well.   REVIEW OF SYSTEMS: Out of a complete 14 system review of symptoms, the patient complains only of the following symptoms, dizziness, headaches, numbness, seizure, weakness, tremors, joint pain, back pain, neck pain, depression, anxious, chest pain, blurred vision, eye itching, change in appetite, fatigue and all other reviewed systems are negative.   ALLERGIES: Allergies  Allergen Reactions  . Shellfish Allergy Anaphylaxis    HOME MEDICATIONS: Outpatient Medications Prior to Visit  Medication Sig Dispense Refill  . allopurinol (ZYLOPRIM) 300 MG tablet Take 1 tablet (300 mg total) by mouth daily. 90 tablet 0  . amLODipine (NORVASC) 5 MG tablet Take 1 tablet (5 mg total) by mouth daily. 30 tablet 2  . aspirin EC 81 MG tablet Take 1 tablet (81 mg total) by mouth daily. 30 tablet 2  . Calcium Carb-Cholecalciferol (CALCIUM-VITAMIN D) 600-400 MG-UNIT TABS Take 1 tablet by mouth daily. 30 tablet 5  . divalproex (DEPAKOTE) 500 MG DR tablet 1 tablet in the morning, 2 in the evening 270 tablet 1  . DULoxetine (CYMBALTA) 30 MG capsule  Take 1 capsule (30 mg total) by mouth 2 (two) times daily. 60 capsule 2  . folic acid (FOLVITE) 1 MG tablet Take 1 tablet (1 mg total) by mouth daily. 30 tablet 5  . gabapentin (NEURONTIN) 100 MG capsule Take 2 capsules (200 mg total) by mouth 2 (two) times daily. (Patient taking differently: Take 100 mg by mouth 2 (two) times daily. ) 60 capsule 1  . hydrOXYzine (ATARAX/VISTARIL) 25 MG tablet Take 1 tablet (25 mg total) by mouth 2 (two) times daily as needed. 40 tablet 2  . lacosamide 100 MG TABS Take 1 tablet (100 mg  total) by mouth 2 (two) times daily. 60 tablet 0  . losartan (COZAAR) 100 MG tablet Take 1 tablet (100 mg total) by mouth daily. 30 tablet 2  . meclizine (ANTIVERT) 25 MG tablet Take 25 mg by mouth daily as needed for dizziness.    . methocarbamol (ROBAXIN) 750 MG tablet Take 1 tablet (750 mg total) by mouth every 8 (eight) hours as needed for muscle spasms. 90 tablet 1  . Multiple Vitamin (MULTIVITAMIN WITH MINERALS) TABS tablet Take 1 tablet by mouth daily. 30 tablet 5  . NARCAN 4 MG/0.1ML LIQD nasal spray kit Place 1 spray into the nose once.     . nitroGLYCERIN (NITROSTAT) 0.4 MG SL tablet Place 1 tablet (0.4 mg total) under the tongue every 5 (five) minutes as needed for chest pain. 25 tablet 2  . oxyCODONE-acetaminophen (PERCOCET) 7.5-325 MG tablet Take 1 tablet by mouth 3 (three) times daily. 20 tablet 0  . rosuvastatin (CRESTOR) 40 MG tablet Take 1 tablet (40 mg total) by mouth daily. 30 tablet 6  . tamsulosin (FLOMAX) 0.4 MG CAPS capsule Take 0.4 mg by mouth daily.    Marland Kitchen testosterone cypionate (DEPOTESTOTERONE CYPIONATE) 100 MG/ML injection Inject 200 mg into the muscle every 28 (twenty-eight) days. For IM use only    . Vitamin D, Ergocalciferol, (DRISDOL) 1.25 MG (50000 UT) CAPS capsule Take 50,000 Units by mouth every Sunday.     . warfarin (COUMADIN) 5 MG tablet Take 2.5-5 mg by mouth See admin instructions. Take 1 tablet (28m) by mouth on Mon, Wed, and Fri. Take 1/2 tablet (2.53m on Sun, Tues, Thurs, and Sat.     No facility-administered medications prior to visit.     PAST MEDICAL HISTORY: Past Medical History:  Diagnosis Date  . Acute kidney injury (HCWaret[facility]7/04/2018  . AKI (acute kidney injury) (HCPortage  . Anginal pain (HCRed[place]  . Anxiety   . Arthritis   . Bipolar disorder (HCBonne[facility]  . Blood in stool   . CAD (coronary artery disease)    a. 2011: cath showing mod non-obstructive disease b. 02/2016: cath with 95% stenosis in the Mid RCA. DES placed.  . Cervical stenosis of spine   .  Chronic anticoagulation 04/27/2018  . Chronic pain disorder 06/05/2017  . CKD (chronic kidney disease), stage III (HCWood[facility]7/16/2019  . Cocaine abuse (HCPalm [place]   history  . Complete heart block (HCMount Pleasant5/24/2017  . Depression   . Diabetes mellitus type 2 in nonobese (HCC)   . Diabetes mellitus without complication (HCDeer Lake  . Dyslipidemia 03/15/2016  . Essential hypertension 05/18/2009   Qualifier: Diagnosis of  By: BaBurnett Kanaris  . Gastroesophageal reflux disease without esophagitis 07/31/2017  . Gout, unspecified   . Headache   . History of CVA (cerebrovascular accident)   . History of pulmonary embolus (PE) 02/14/2017  . HTN (hypertension)   .  Hypercholesterolemia   . Hyperglycemia   . Leukopenia 05/06/2018  . Migraines   . Myocardial infarction [facility]) 2008   drug cocaine and marijuana at time of mi none  since    . Normocytic anemia 05/06/2018  . Pneumonia   . Polysubstance abuse ([place]) 01/15/2013  . Rectal bleeding   . Renal insufficiency   . Seizure ([place]) 05/06/2018  . Seizures ([place])   . Stroke ([place])   . Vertigo     PAST SURGICAL HISTORY: Past Surgical History:  Procedure Laterality Date  . ANTERIOR CERVICAL DECOMP/DISCECTOMY FUSION N/A 01/24/2017   Procedure: Cervical Four-Five,Cervical Five Six,Cervical Six-Seven Anterior cervical discectomy with fusion and plate with Cervical Three-Thoracic- One Laminectomy/Foraminotomy with Cervical Three-Thoracic- One Fixation and fusion;  Surgeon: Kevan Ny Ditty, MD;  Location: [facility];  Service: Neurosurgery;  Laterality: N/A;  Part-1 anterior approach  . CARDIAC CATHETERIZATION  2008; ~ 2011   [place]-40%lad  . CARDIAC CATHETERIZATION N/A 03/16/2016   Procedure: Left Heart Cath and Coronary Angiography;  Surgeon: Leonie Man, MD;  Location: [place] CV LAB;  Service: Cardiovascular;  Laterality: N/A;  . COLONOSCOPY WITH PROPOFOL N/A 04/17/2018   Procedure: COLONOSCOPY WITH PROPOFOL;  Surgeon: Wonda Horner, MD;  Location[facility]  ENDOSCOPY;  Service: Endoscopy;  Laterality: N/A;  . CORONARY ANGIOPLASTY     STENTS  . EYE SURGERY Bilateral    "for double vision"  . INGUINAL HERNIA REPAIR  05/12/2012   Procedure: HERNIA REPAIR INGUINAL ADULT;  Surgeon: Zenovia Jarred, MD;  Location: [facility];  Service: General;  Laterality: Right;  . INGUINAL HERNIA REPAIR Left 02/02/2003   Dr. Georganna Skeans. repair with mesh. Same procedure done again in  05/22/2004   . INGUINAL HERNIA REPAIR Left ?date 2nd OR  . LEFT HEART CATH AND CORONARY ANGIOGRAPHY N/A 04/29/2018   Procedure: LEFT HEART CATH AND CORONARY ANGIOGRAPHY;  Surgeon: Martinique, Peter M, MD;  Location: [place] CV LAB;  Service: Cardiovascular;  Laterality: N/A;  . POSTERIOR CERVICAL FUSION/FORAMINOTOMY N/A 01/24/2017   Procedure: Cervical Three-Thoracic One-Laminectomy/Foraminotomy with Cervical Three-Thoracic One Fixation and fusion;  Surgeon: Kevan Ny Ditty, MD;  Location: [facility];  Service: Neurosurgery;  Laterality: N/A;  Part-2 Posterior approach  . RECONSTRUCT / STABILIZE DISTAL ULNA    . SHOULDER OPEN ROTATOR CUFF REPAIR Left 2010  . SHOULDER SURGERY Left    "injured on the job; had to cut small bone out"  . THUMB FUSION Right     FAMILY HISTORY: Family History  Problem Relation Age of Onset  . Heart attack Mother        x7. Still alive  . Heart attack Father        deceased b/c of MI  . Heart attack Brother        older, deceased b/c of MI   . Cancer Brother        bone cancer in 1 brother, ? brain cancer in other brother  . Heart attack Brother   . Other Brother        MVA  . Other Brother   . Bone cancer Brother   . Cancer Brother   . SIDS Sister     SOCIAL HISTORY: Social History   Socioeconomic History  . Marital status: Widowed    Spouse name: Not on file  . Number of children: Not on file  . Years of education: Not on file  . Highest education level: Not on file  Occupational History  .  Not on file  Social Needs   . Financial resource strain: Not on file  . Food insecurity    Worry: Not on file    Inability: Not on file  . Transportation needs    Medical: Not on file    Non-medical: Not on file  Tobacco Use  . Smoking status: Never Smoker  . Smokeless tobacco: Never Used  Substance and Sexual Activity  . Alcohol use: Not Currently    Comment: 16 oz beer every 2-3 days- states he does not drink  . Drug use: Not Currently    Types: "Crack" cocaine, Marijuana, Cocaine    Comment:  "quit 2008"  . Sexual activity: Not Currently  Lifestyle  . Physical activity    Days per week: Not on file    Minutes per session: Not on file  . Stress: Not on file  Relationships  . Social Herbalist on phone: Not on file    Gets together: Not on file    Attends religious service: Not on file    Active member of club or organization: Not on file    Attends meetings of clubs or organizations: Not on file    Relationship status: Not on file  . Intimate partner violence    Fear of current or ex partner: Not on file    Emotionally abused: Not on file    Physically abused: Not on file    Forced sexual activity: Not on file  Other Topics Concern  . Not on file  Social History Narrative   Lives in [place] with his wife and son.       PHYSICAL EXAM  Vitals:   06/23/19 1340  BP: 135/86  Pulse: 68  Temp: (!) 96.9 F (36.1 C)  TempSrc: Oral  Weight: 212 lb (96.2 kg)  Height: 5' 11.5" (1.816 m)   Body mass index is 29.16 kg/m.  Generalized: Well developed, in no acute distress  Cardiology: normal rate and rhythm, no murmur noted Neurological examination  Mentation: Alert oriented to time, place, history taking. Follows all commands speech and language fluent Cranial nerve II-XII: Pupils were equal round reactive to light. Extraocular movements were full, visual field were full on confrontational test. Facial sensation and strength were normal. Uvula tongue midline. Head turning and  shoulder shrug  were normal and symmetric. Motor: The motor testing reveals 5 over 5 strength of all 4 extremities. Good symmetric motor tone is noted throughout.  Sensory: Sensory testing is intact to soft touch on all 4 extremities. No evidence of extinction is noted.  Coordination: Cerebellar testing reveals good finger-nose-finger and heel-to-shin bilaterally.  Gait and station: Gait is normal. Walks with cane. Tandem gait is normal. Romberg is negative. No drift is seen.    DIAGNOSTIC DATA (LABS, IMAGING, TESTING) - I reviewed patient records, labs, notes, testing and imaging myself where available.  No flowsheet data found.   Lab Results  Component Value Date   WBC 4.3 06/11/2019   HGB 16.0 06/11/2019   HCT 48.5 06/11/2019   MCV 89.2 06/11/2019   PLT 220 06/11/2019      Component Value Date/Time   NA 137 06/11/2019 2043   NA 140 01/20/2019 1011   K 3.9 06/11/2019 2043   CL 108 06/11/2019 2043   CO2 20 (L) 06/11/2019 2043   GLUCOSE 107 (H) 06/11/2019 2043   BUN 9 06/11/2019 2043   BUN 14 01/20/2019 1011   CREATININE 1.26 (H) 06/11/2019 2043  CALCIUM 8.7 (L) 06/11/2019 2043   PROT 7.5 06/11/2019 2043   PROT 7.5 01/20/2019 1011   ALBUMIN 3.8 06/11/2019 2043   ALBUMIN 4.5 01/20/2019 1011   AST 37 06/11/2019 2043   ALT 25 06/11/2019 2043   ALKPHOS 42 06/11/2019 2043   BILITOT 0.5 06/11/2019 2043   BILITOT 0.4 01/20/2019 1011   GFRNONAA 60 (L) 06/11/2019 2043   GFRAA >60 06/11/2019 2043   Lab Results  Component Value Date   CHOL 162 12/25/2018   HDL 45 12/25/2018   LDLCALC 47 12/25/2018   TRIG 351 (H) 12/25/2018   CHOLHDL 3.6 12/25/2018   Lab Results  Component Value Date   HGBA1C 6.3 (H) 12/25/2018   Lab Results  Component Value Date   VITAMINB12 279 05/06/2018   Lab Results  Component Value Date   TSH 1.057 04/27/2018       ASSESSMENT AND PLAN 63 y.o. year old male  has a past medical history of Acute kidney injury ([place]) (04/27/2018), AKI (acute  kidney injury) ([place]), Anginal pain ([place]), Anxiety, Arthritis, Bipolar disorder ([place]), Blood in stool, CAD (coronary artery disease), Cervical stenosis of spine, Chronic anticoagulation (04/27/2018), Chronic pain disorder (06/05/2017), CKD (chronic kidney disease), stage III ([place]) (05/06/2018), Cocaine abuse ([place]), Complete heart block ([place]) (03/14/2016), Depression, Diabetes mellitus type 2 in nonobese ([place]), Diabetes mellitus without complication ([place]), Dyslipidemia (03/15/2016), Essential hypertension (05/18/2009), Gastroesophageal reflux disease without esophagitis (07/31/2017), Gout, unspecified, Headache, History of CVA (cerebrovascular accident), History of pulmonary embolus (PE) (02/14/2017), HTN (hypertension), Hypercholesterolemia, Hyperglycemia, Leukopenia (05/06/2018), Migraines, Myocardial infarction ([place]) (2008), Normocytic anemia (05/06/2018), Pneumonia, Polysubstance abuse ([place]) (01/15/2013), Rectal bleeding, Renal insufficiency, Seizure ([place]) (05/06/2018), Seizures ([place]), Stroke ([place]), and Vertigo. here with     ICD-10-CM   1. Seizure ([place])  R56.9     Mr. Marinaccio reports that is is no longer drinking and agrees to taking AED's daily as prescribed. We will continue Vimpat 161m twice daily as well as Depakote 5095mtwice daily. He was advised to take medications every 12 hours. I will check labs today. He was encouraged to avoid alcohol and illicit drugs. I have offered referral for grief counseling but he declines at this time. He was advised not to drive. We will follow up in 3 months, sooner if needed.  He verbalizes understanding and agreement with this plan.   No orders of the defined types were placed in this encounter.    No orders of the defined types were placed in this encounter.     I spent 15 minutes with the patient. 50% of this time was spent counseling and educating patient on plan of care and medications.    AmDebbora PrestoFNP-C 06/23/2019, 2:13 PM [facility] [address]SuHernandorWavelandNC [phone]

## 2019-06-23 NOTE — Progress Notes (Signed)
I have read the note, and I agree with the clinical assessment and plan.  Charles K Willis   

## 2019-06-25 ENCOUNTER — Encounter: Payer: Medicaid Other | Admitting: Pharmacist

## 2019-06-25 LAB — LACOSAMIDE: Lacosamide: 2.1 ug/mL — ABNORMAL LOW (ref 5.0–10.0)

## 2019-06-25 LAB — VALPROIC ACID LEVEL: Valproic Acid Lvl: 23 ug/mL — ABNORMAL LOW (ref 50–100)

## 2019-07-01 ENCOUNTER — Telehealth: Payer: Self-pay | Admitting: *Deleted

## 2019-07-01 NOTE — Telephone Encounter (Signed)
LMVM for pt to return call for lab results.  

## 2019-07-01 NOTE — Telephone Encounter (Signed)
-----   Message from Debbora Presto, NP sent at 06/25/2019  1:39 PM EDT ----- Please let him know that his Vimpat levels were low. Please verify that he is taking medication every day. It is imperative to help reduce likelihood of seizure.

## 2019-07-02 ENCOUNTER — Encounter: Payer: Self-pay | Admitting: *Deleted

## 2019-07-09 ENCOUNTER — Other Ambulatory Visit: Payer: Self-pay | Admitting: Family Medicine

## 2019-07-09 DIAGNOSIS — M25512 Pain in left shoulder: Secondary | ICD-10-CM

## 2019-07-16 ENCOUNTER — Other Ambulatory Visit: Payer: Medicaid Other

## 2019-07-20 IMAGING — CR DG KNEE COMPLETE 4+V*L*
4 series · 4 of 4 positions shown · non-contrast
Comparison: Left knee radiographs performed 04/04/2017

CLINICAL DATA: Status post injury to the left knee. Lkw Tiger
Hemi Ch. Cannot put weight on the left leg. Left knee swelling. Initial
encounter.

EXAM:
LEFT KNEE - COMPLETE 4+ VIEW

[knee ap]
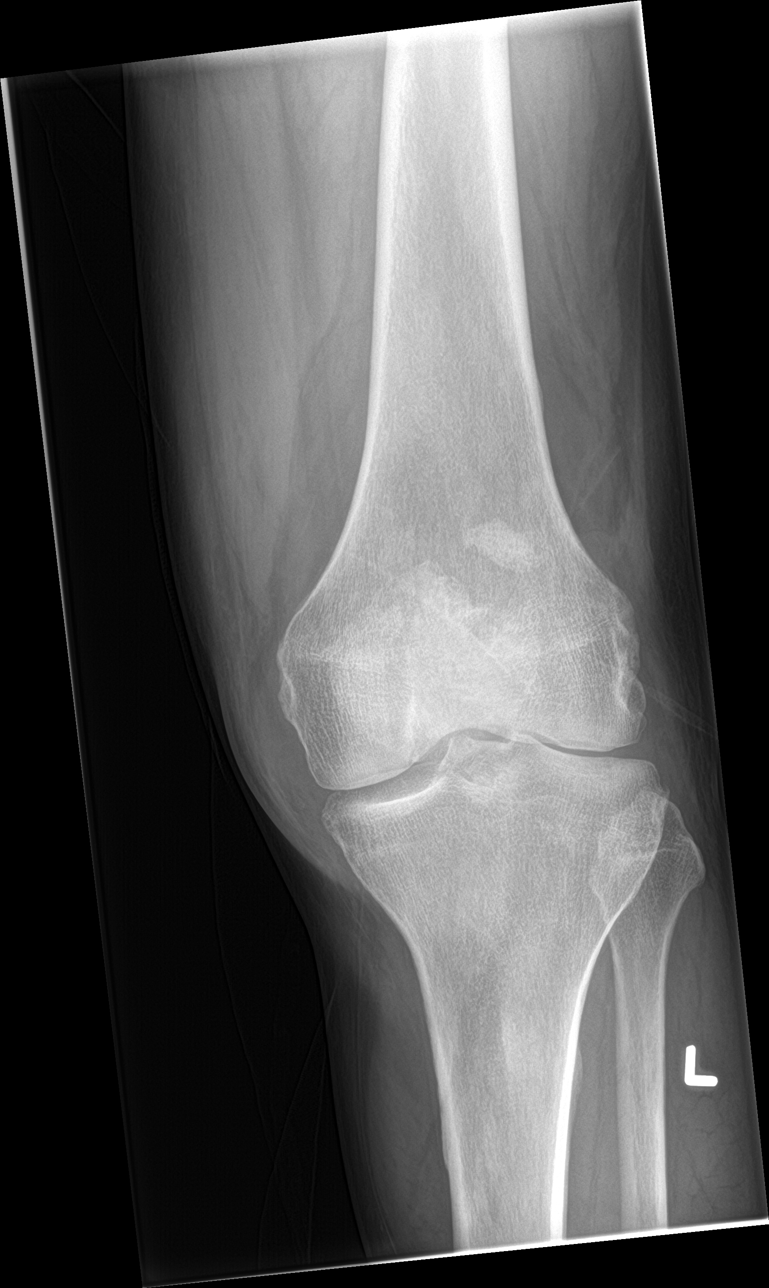

[knee lat]
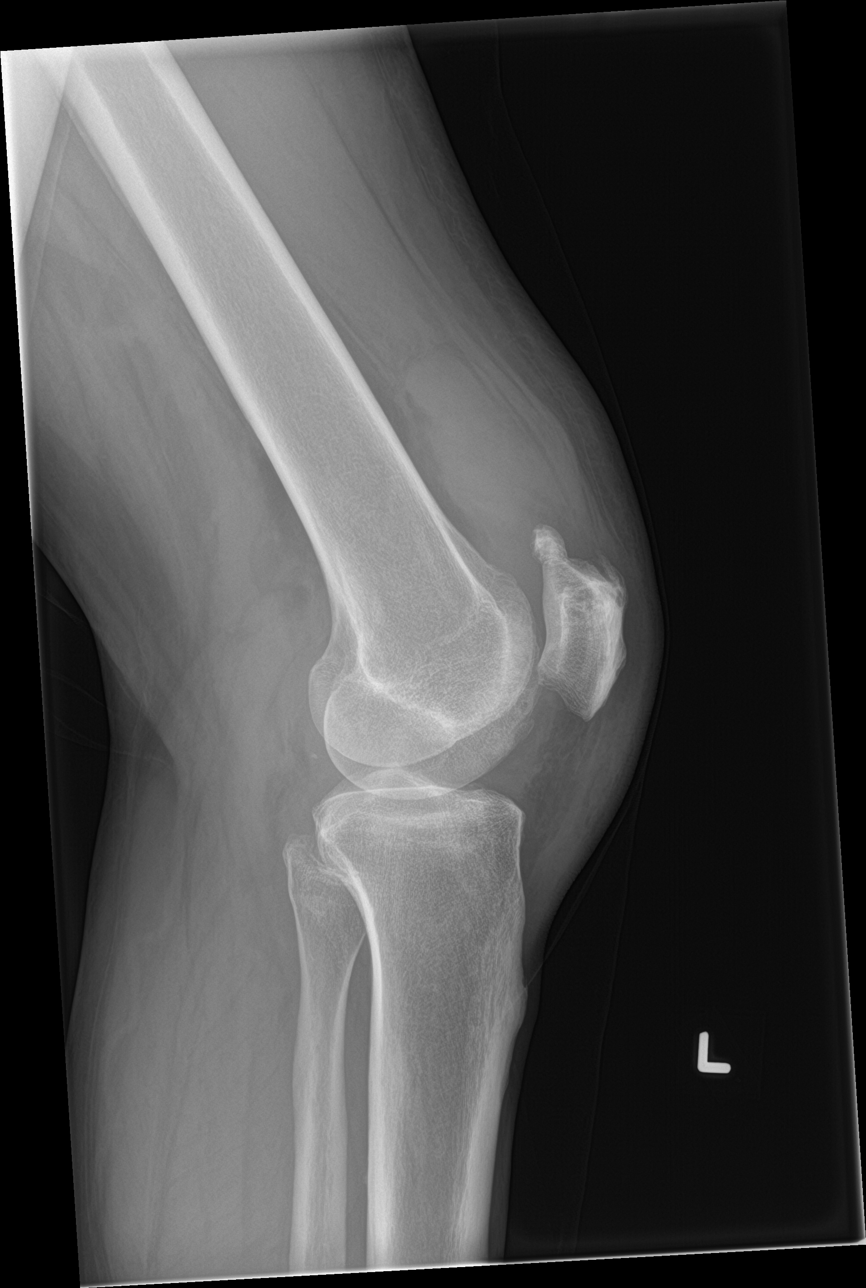

[knee obl (1 of 2)]
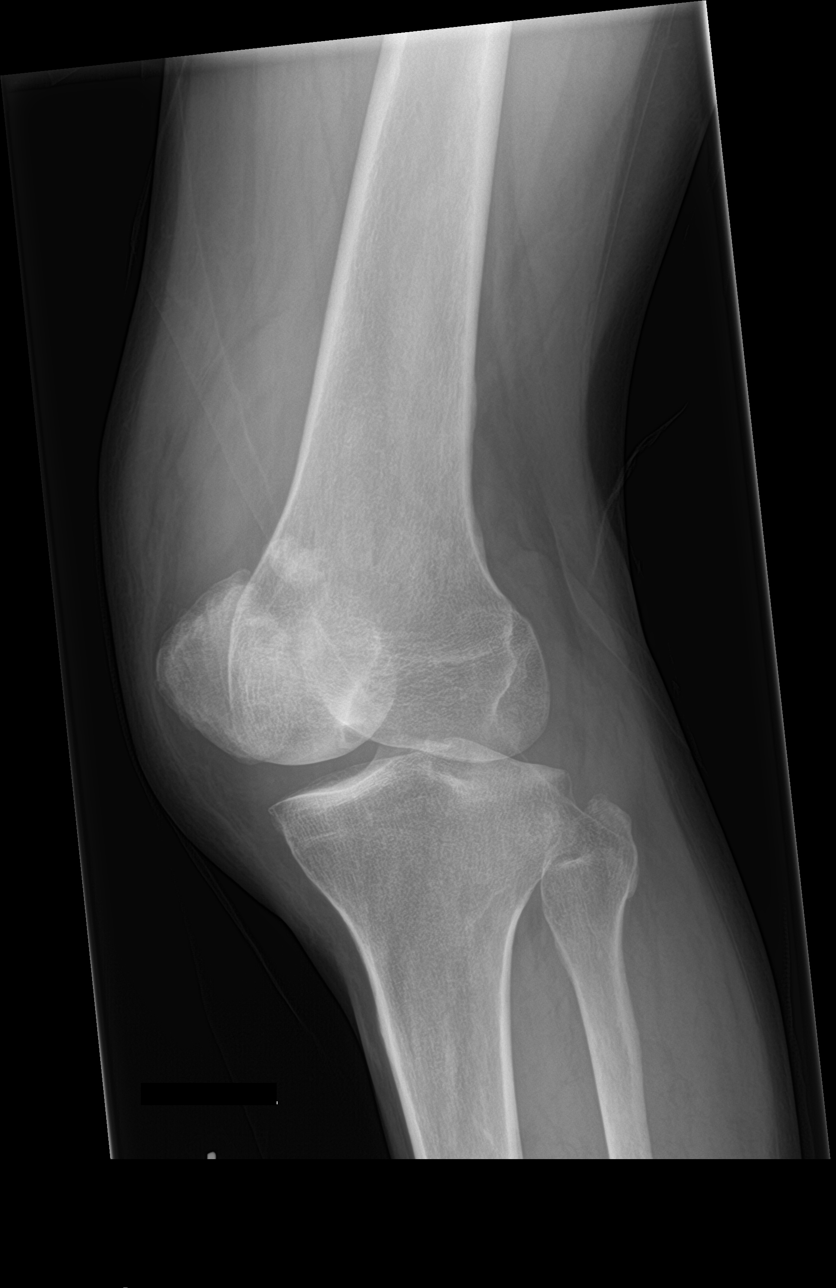

[knee obl (2 of 2)]
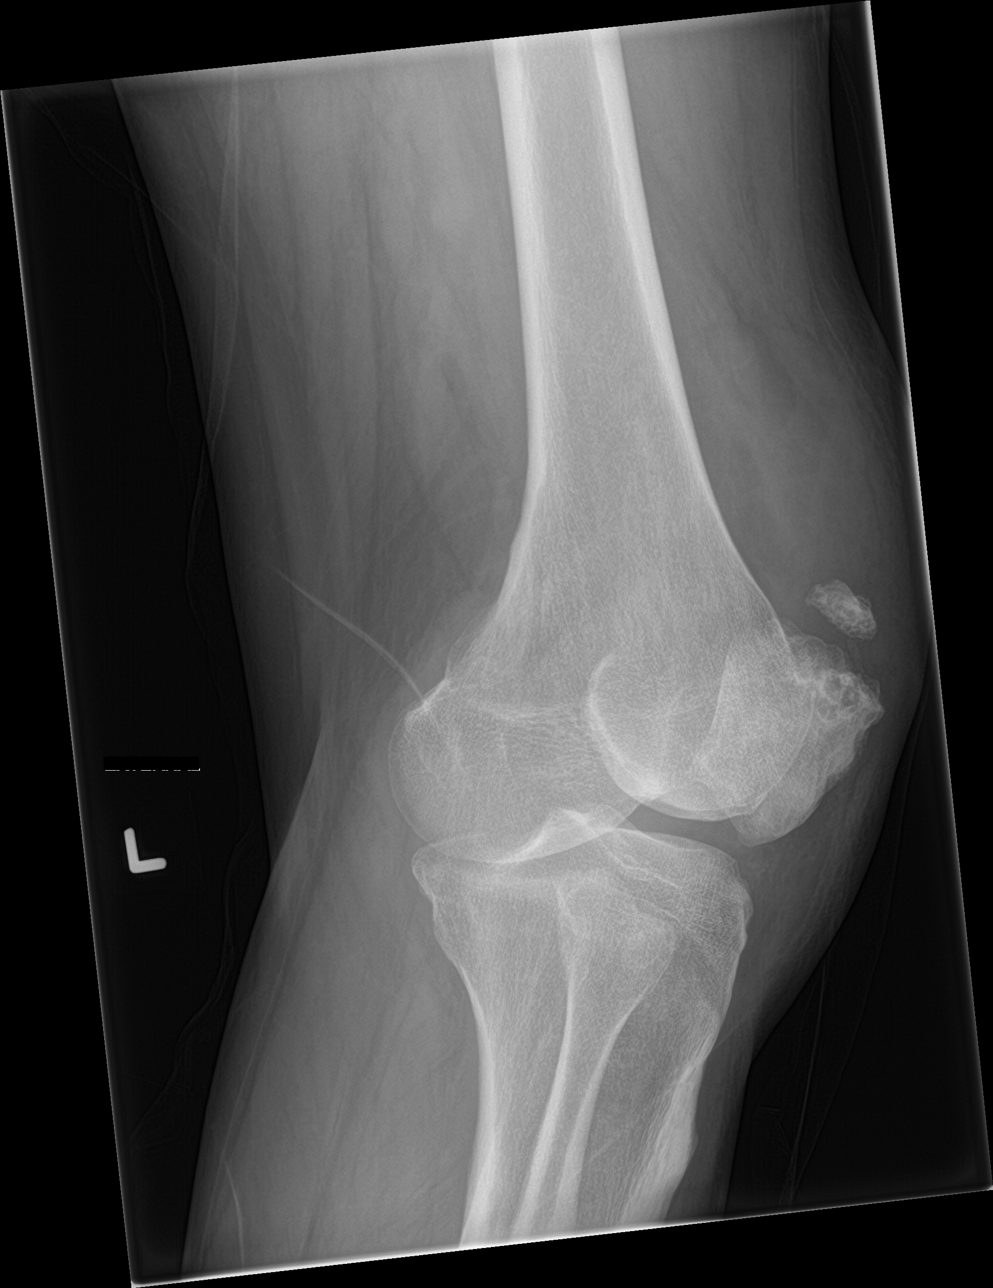

[4 of 4 positions shown; findings below may reference images not displayed]

FINDINGS: No definite acute fracture is seen. Minimal cortical irregularity
along the lateral aspect of the proximal tibia may reflect
positioning.

A displaced superior fragment is again noted with respect to the
bipartite patella. Visualized joint spaces are grossly preserved.
There is mild cortical irregularity at the lateral compartment.

A large knee joint effusion is noted, new from the prior study.
IMPRESSION: 1. No definite acute fracture seen.
2. Large knee joint effusion, new from the prior study. This raises
concern for underlying internal derangement. MRI of the left knee
would be helpful for further evaluation, as deemed clinically
appropriate.

## 2019-08-19 ENCOUNTER — Other Ambulatory Visit: Payer: Self-pay

## 2019-08-19 ENCOUNTER — Emergency Department (HOSPITAL_COMMUNITY): Payer: Medicare HMO

## 2019-08-19 ENCOUNTER — Inpatient Hospital Stay (HOSPITAL_COMMUNITY): Payer: Medicare HMO

## 2019-08-19 ENCOUNTER — Inpatient Hospital Stay (HOSPITAL_COMMUNITY)
Admission: EM | Admit: 2019-08-19 | Discharge: 2019-08-21 | DRG: 312 | Disposition: A | Discharge status: Home / Self Care | Payer: Medicare HMO | Attending: Internal Medicine | Admitting: Internal Medicine

## 2019-08-19 DIAGNOSIS — Z86711 Personal history of pulmonary embolism: Secondary | ICD-10-CM | POA: Diagnosis not present

## 2019-08-19 DIAGNOSIS — G8929 Other chronic pain: Secondary | ICD-10-CM | POA: Diagnosis present

## 2019-08-19 DIAGNOSIS — R338 Other retention of urine: Secondary | ICD-10-CM | POA: Diagnosis present

## 2019-08-19 DIAGNOSIS — R569 Unspecified convulsions: Secondary | ICD-10-CM

## 2019-08-19 DIAGNOSIS — M109 Gout, unspecified: Secondary | ICD-10-CM | POA: Diagnosis present

## 2019-08-19 DIAGNOSIS — Z7901 Long term (current) use of anticoagulants: Secondary | ICD-10-CM

## 2019-08-19 DIAGNOSIS — R3912 Poor urinary stream: Secondary | ICD-10-CM | POA: Diagnosis not present

## 2019-08-19 DIAGNOSIS — M5412 Radiculopathy, cervical region: Secondary | ICD-10-CM | POA: Diagnosis not present

## 2019-08-19 DIAGNOSIS — R1032 Left lower quadrant pain: Secondary | ICD-10-CM | POA: Diagnosis not present

## 2019-08-19 DIAGNOSIS — Z7982 Long term (current) use of aspirin: Secondary | ICD-10-CM | POA: Diagnosis not present

## 2019-08-19 DIAGNOSIS — R339 Retention of urine, unspecified: Secondary | ICD-10-CM | POA: Diagnosis not present

## 2019-08-19 DIAGNOSIS — R3916 Straining to void: Secondary | ICD-10-CM | POA: Diagnosis not present

## 2019-08-19 DIAGNOSIS — G40909 Epilepsy, unspecified, not intractable, without status epilepticus: Secondary | ICD-10-CM | POA: Diagnosis not present

## 2019-08-19 DIAGNOSIS — Z86718 Personal history of other venous thrombosis and embolism: Secondary | ICD-10-CM

## 2019-08-19 DIAGNOSIS — I442 Atrioventricular block, complete: Secondary | ICD-10-CM | POA: Diagnosis not present

## 2019-08-19 DIAGNOSIS — Z8673 Personal history of transient ischemic attack (TIA), and cerebral infarction without residual deficits: Secondary | ICD-10-CM | POA: Diagnosis not present

## 2019-08-19 DIAGNOSIS — Z91013 Allergy to seafood: Secondary | ICD-10-CM | POA: Diagnosis not present

## 2019-08-19 DIAGNOSIS — D61818 Other pancytopenia: Secondary | ICD-10-CM | POA: Diagnosis not present

## 2019-08-19 DIAGNOSIS — R9431 Abnormal electrocardiogram [ECG] [EKG]: Secondary | ICD-10-CM | POA: Diagnosis not present

## 2019-08-19 DIAGNOSIS — N401 Enlarged prostate with lower urinary tract symptoms: Secondary | ICD-10-CM | POA: Diagnosis not present

## 2019-08-19 DIAGNOSIS — E1165 Type 2 diabetes mellitus with hyperglycemia: Secondary | ICD-10-CM | POA: Diagnosis present

## 2019-08-19 DIAGNOSIS — I129 Hypertensive chronic kidney disease with stage 1 through stage 4 chronic kidney disease, or unspecified chronic kidney disease: Secondary | ICD-10-CM | POA: Diagnosis not present

## 2019-08-19 DIAGNOSIS — R404 Transient alteration of awareness: Secondary | ICD-10-CM | POA: Diagnosis not present

## 2019-08-19 DIAGNOSIS — K219 Gastro-esophageal reflux disease without esophagitis: Secondary | ICD-10-CM | POA: Diagnosis present

## 2019-08-19 DIAGNOSIS — K4090 Unilateral inguinal hernia, without obstruction or gangrene, not specified as recurrent: Secondary | ICD-10-CM | POA: Diagnosis not present

## 2019-08-19 DIAGNOSIS — R3911 Hesitancy of micturition: Secondary | ICD-10-CM

## 2019-08-19 DIAGNOSIS — E1122 Type 2 diabetes mellitus with diabetic chronic kidney disease: Secondary | ICD-10-CM | POA: Diagnosis present

## 2019-08-19 DIAGNOSIS — Z79899 Other long term (current) drug therapy: Secondary | ICD-10-CM

## 2019-08-19 DIAGNOSIS — Z20828 Contact with and (suspected) exposure to other viral communicable diseases: Secondary | ICD-10-CM | POA: Diagnosis not present

## 2019-08-19 DIAGNOSIS — N183 Chronic kidney disease, stage 3 unspecified: Secondary | ICD-10-CM | POA: Diagnosis present

## 2019-08-19 DIAGNOSIS — I4891 Unspecified atrial fibrillation: Secondary | ICD-10-CM

## 2019-08-19 DIAGNOSIS — M1990 Unspecified osteoarthritis, unspecified site: Secondary | ICD-10-CM | POA: Diagnosis present

## 2019-08-19 DIAGNOSIS — N179 Acute kidney failure, unspecified: Secondary | ICD-10-CM

## 2019-08-19 DIAGNOSIS — R52 Pain, unspecified: Secondary | ICD-10-CM | POA: Diagnosis not present

## 2019-08-19 DIAGNOSIS — R55 Syncope and collapse: Principal | ICD-10-CM | POA: Diagnosis present

## 2019-08-19 DIAGNOSIS — D72819 Decreased white blood cell count, unspecified: Secondary | ICD-10-CM

## 2019-08-19 DIAGNOSIS — M4802 Spinal stenosis, cervical region: Secondary | ICD-10-CM | POA: Diagnosis present

## 2019-08-19 DIAGNOSIS — Z8249 Family history of ischemic heart disease and other diseases of the circulatory system: Secondary | ICD-10-CM

## 2019-08-19 DIAGNOSIS — Z79891 Long term (current) use of opiate analgesic: Secondary | ICD-10-CM | POA: Diagnosis not present

## 2019-08-19 DIAGNOSIS — R079 Chest pain, unspecified: Secondary | ICD-10-CM | POA: Diagnosis not present

## 2019-08-19 DIAGNOSIS — E876 Hypokalemia: Secondary | ICD-10-CM | POA: Diagnosis present

## 2019-08-19 DIAGNOSIS — I2510 Atherosclerotic heart disease of native coronary artery without angina pectoris: Secondary | ICD-10-CM | POA: Diagnosis present

## 2019-08-19 DIAGNOSIS — I252 Old myocardial infarction: Secondary | ICD-10-CM

## 2019-08-19 DIAGNOSIS — F419 Anxiety disorder, unspecified: Secondary | ICD-10-CM | POA: Diagnosis present

## 2019-08-19 DIAGNOSIS — N189 Chronic kidney disease, unspecified: Secondary | ICD-10-CM | POA: Diagnosis not present

## 2019-08-19 DIAGNOSIS — E785 Hyperlipidemia, unspecified: Secondary | ICD-10-CM | POA: Diagnosis present

## 2019-08-19 DIAGNOSIS — F319 Bipolar disorder, unspecified: Secondary | ICD-10-CM | POA: Diagnosis present

## 2019-08-19 DIAGNOSIS — D649 Anemia, unspecified: Secondary | ICD-10-CM | POA: Diagnosis not present

## 2019-08-19 DIAGNOSIS — I10 Essential (primary) hypertension: Secondary | ICD-10-CM | POA: Diagnosis present

## 2019-08-19 DIAGNOSIS — E7800 Pure hypercholesterolemia, unspecified: Secondary | ICD-10-CM | POA: Diagnosis present

## 2019-08-19 DIAGNOSIS — R109 Unspecified abdominal pain: Secondary | ICD-10-CM | POA: Diagnosis present

## 2019-08-19 DIAGNOSIS — Z808 Family history of malignant neoplasm of other organs or systems: Secondary | ICD-10-CM

## 2019-08-19 DIAGNOSIS — M4722 Other spondylosis with radiculopathy, cervical region: Secondary | ICD-10-CM | POA: Diagnosis present

## 2019-08-19 DIAGNOSIS — R4020 Unspecified coma: Secondary | ICD-10-CM | POA: Diagnosis not present

## 2019-08-19 DIAGNOSIS — I480 Paroxysmal atrial fibrillation: Secondary | ICD-10-CM | POA: Diagnosis present

## 2019-08-19 DIAGNOSIS — F1910 Other psychoactive substance abuse, uncomplicated: Secondary | ICD-10-CM

## 2019-08-19 LAB — TROPONIN I (HIGH SENSITIVITY)
Troponin I (High Sensitivity): <2 ng/L (ref <18)
Troponin I (High Sensitivity): <2 ng/L (ref <18)

## 2019-08-19 LAB — URINALYSIS, ROUTINE W REFLEX MICROSCOPIC
Bilirubin Urine: NEGATIVE
Glucose, UA: NEGATIVE mg/dL
Hgb urine dipstick: NEGATIVE
Ketones, ur: NEGATIVE mg/dL
Leukocytes,Ua: NEGATIVE
Nitrite: NEGATIVE
Protein, ur: NEGATIVE mg/dL
Specific Gravity, Urine: 1.013 (ref 1.005–1.030)
pH: 6 (ref 5.0–8.0)

## 2019-08-19 LAB — RETICULOCYTES
Immature Retic Fract: 14.7 % (ref 2.3–15.9)
RBC.: 5.4 MIL/uL (ref 4.22–5.81)
Retic Count, Absolute: 50.2 10*3/uL (ref 19.0–186.0)
Retic Ct Pct: 0.9 % (ref 0.4–3.1)

## 2019-08-19 LAB — CBC WITH DIFFERENTIAL/PLATELET
Abs Immature Granulocytes: 0.01 10*3/uL (ref 0.00–0.07)
Basophils Absolute: 0 10*3/uL (ref 0.0–0.1)
Basophils Relative: 0 %
Eosinophils Absolute: 0 10*3/uL (ref 0.0–0.5)
Eosinophils Relative: 0 %
HCT: 32.1 % — ABNORMAL LOW (ref 39.0–52.0)
Hemoglobin: 9.7 g/dL — ABNORMAL LOW (ref 13.0–17.0)
Immature Granulocytes: 0 %
Lymphocytes Relative: 15 %
Lymphs Abs: 0.5 10*3/uL — ABNORMAL LOW (ref 0.7–4.0)
MCH: 28.5 pg (ref 26.0–34.0)
MCHC: 30.2 g/dL (ref 30.0–36.0)
MCV: 94.4 fL (ref 80.0–100.0)
Monocytes Absolute: 0.3 10*3/uL (ref 0.1–1.0)
Monocytes Relative: 9 %
Neutro Abs: 2.3 10*3/uL (ref 1.7–7.7)
Neutrophils Relative %: 76 %
Platelets: 123 10*3/uL — ABNORMAL LOW (ref 150–400)
RBC: 3.4 MIL/uL — ABNORMAL LOW (ref 4.22–5.81)
RDW: 14.6 % (ref 11.5–15.5)
WBC: 3.1 10*3/uL — ABNORMAL LOW (ref 4.0–10.5)
nRBC: 0 % (ref 0.0–0.2)

## 2019-08-19 LAB — SARS CORONAVIRUS 2 (TAT 6-24 HRS): SARS Coronavirus 2: NEGATIVE

## 2019-08-19 LAB — COMPREHENSIVE METABOLIC PANEL
ALT: 15 U/L (ref 0–44)
AST: 18 U/L (ref 15–41)
Albumin: 3.2 g/dL — ABNORMAL LOW (ref 3.5–5.0)
Alkaline Phosphatase: 38 U/L (ref 38–126)
Anion gap: 10 (ref 5–15)
BUN: 8 mg/dL (ref 8–23)
CO2: 21 mmol/L — ABNORMAL LOW (ref 22–32)
Calcium: 8.5 mg/dL — ABNORMAL LOW (ref 8.9–10.3)
Chloride: 107 mmol/L (ref 98–111)
Creatinine, Ser: 1.41 mg/dL — ABNORMAL HIGH (ref 0.61–1.24)
GFR calc Af Amer: >60 mL/min (ref >60)
GFR calc non Af Amer: 52 mL/min — ABNORMAL LOW (ref >60)
Glucose, Bld: 124 mg/dL — ABNORMAL HIGH (ref 70–99)
Potassium: 3.8 mmol/L (ref 3.5–5.1)
Sodium: 138 mmol/L (ref 135–145)
Total Bilirubin: 0.6 mg/dL (ref 0.3–1.2)
Total Protein: 6 g/dL — ABNORMAL LOW (ref 6.5–8.1)

## 2019-08-19 LAB — IRON AND TIBC
Iron: 64 ug/dL (ref 45–182)
Saturation Ratios: 15 % — ABNORMAL LOW (ref 17.9–39.5)
TIBC: 438 ug/dL (ref 250–450)
UIBC: 374 ug/dL

## 2019-08-19 LAB — PROTIME-INR
INR: 1.2 (ref 0.8–1.2)
Prothrombin Time: 14.8 seconds (ref 11.4–15.2)

## 2019-08-19 LAB — LACTATE DEHYDROGENASE: LDH: 174 U/L (ref 98–192)

## 2019-08-19 LAB — CBC
HCT: 49.7 % (ref 39.0–52.0)
Hemoglobin: 15.4 g/dL (ref 13.0–17.0)
MCH: 27.9 pg (ref 26.0–34.0)
MCHC: 31 g/dL (ref 30.0–36.0)
MCV: 90.2 fL (ref 80.0–100.0)
Platelets: 245 10*3/uL (ref 150–400)
RBC: 5.51 MIL/uL (ref 4.22–5.81)
RDW: 14.5 % (ref 11.5–15.5)
WBC: 4.9 10*3/uL (ref 4.0–10.5)
nRBC: 0 % (ref 0.0–0.2)

## 2019-08-19 LAB — FOLATE: Folate: 7.1 ng/mL (ref >5.9)

## 2019-08-19 LAB — RAPID URINE DRUG SCREEN, HOSP PERFORMED
Amphetamines: NOT DETECTED
Barbiturates: NOT DETECTED
Benzodiazepines: NOT DETECTED
Cocaine: NOT DETECTED
Opiates: NOT DETECTED
Tetrahydrocannabinol: NOT DETECTED

## 2019-08-19 LAB — TYPE AND SCREEN
ABO/RH(D): B POS
Antibody Screen: NEGATIVE

## 2019-08-19 LAB — GLUCOSE, CAPILLARY: Glucose-Capillary: 94 mg/dL (ref 70–99)

## 2019-08-19 LAB — HEMOGLOBIN A1C
Hgb A1c MFr Bld: 6.3 % — ABNORMAL HIGH (ref 4.8–5.6)
Mean Plasma Glucose: 134.11 mg/dL

## 2019-08-19 LAB — TSH: TSH: 0.752 u[IU]/mL (ref 0.350–4.500)

## 2019-08-19 LAB — VITAMIN B12: Vitamin B-12: 162 pg/mL — ABNORMAL LOW (ref 180–914)

## 2019-08-19 LAB — FERRITIN: Ferritin: 16 ng/mL — ABNORMAL LOW (ref 24–336)

## 2019-08-19 LAB — POC OCCULT BLOOD, ED: Fecal Occult Bld: NEGATIVE

## 2019-08-19 LAB — VALPROIC ACID LEVEL: Valproic Acid Lvl: 52 ug/mL (ref 50.0–100.0)

## 2019-08-19 LAB — ETHANOL: Alcohol, Ethyl (B): 15 mg/dL — ABNORMAL HIGH (ref <10)

## 2019-08-19 MED ORDER — CALCIUM CARBONATE-VITAMIN D 500-200 MG-UNIT PO TABS
1.0000 | ORAL_TABLET | Freq: Every day | ORAL | Status: DC
  Administered 2019-08-20 – 2019-08-21 (×2): 1 via ORAL
  Filled 2019-08-19 (×3): qty 1

## 2019-08-19 MED ORDER — SODIUM CHLORIDE 0.9 % IV BOLUS
1000.0000 mL | Freq: Once | INTRAVENOUS | Status: AC
  Administered 2019-08-19: 12:00:00 1000 mL via INTRAVENOUS

## 2019-08-19 MED ORDER — IOHEXOL 300 MG/ML  SOLN
100.0000 mL | Freq: Once | INTRAMUSCULAR | Status: AC | PRN
  Administered 2019-08-19: 100 mL via INTRAVENOUS

## 2019-08-19 MED ORDER — DIVALPROEX SODIUM 250 MG PO DR TAB
1000.0000 mg | DELAYED_RELEASE_TABLET | Freq: Every morning | ORAL | Status: DC
  Administered 2019-08-20 – 2019-08-21 (×2): 1000 mg via ORAL
  Filled 2019-08-19 (×2): qty 4

## 2019-08-19 MED ORDER — SODIUM CHLORIDE 0.9 % IV SOLN
8.0000 mg | Freq: Three times a day (TID) | INTRAVENOUS | Status: DC | PRN
  Filled 2019-08-19: qty 4

## 2019-08-19 MED ORDER — NALOXONE HCL 0.4 MG/ML IJ SOLN
INTRAMUSCULAR | Status: AC
  Filled 2019-08-19: qty 1

## 2019-08-19 MED ORDER — ALLOPURINOL 100 MG PO TABS
300.0000 mg | ORAL_TABLET | Freq: Every day | ORAL | Status: DC
  Administered 2019-08-20 – 2019-08-21 (×2): 300 mg via ORAL
  Filled 2019-08-19 (×2): qty 3
  Filled 2019-08-19: qty 1

## 2019-08-19 MED ORDER — NALOXONE HCL 0.4 MG/ML IJ SOLN
0.4000 mg | Freq: Once | INTRAMUSCULAR | Status: AC
  Administered 2019-08-19: 12:00:00 0.4 mg via INTRAVENOUS

## 2019-08-19 MED ORDER — ACETAMINOPHEN 650 MG RE SUPP: 650.0000 mg | Freq: Four times a day (QID) | RECTAL | Status: DC | PRN

## 2019-08-19 MED ORDER — AMLODIPINE BESYLATE 5 MG PO TABS
5.0000 mg | ORAL_TABLET | Freq: Every day | ORAL | Status: DC
  Administered 2019-08-20 – 2019-08-21 (×2): 5 mg via ORAL
  Filled 2019-08-19 (×2): qty 1

## 2019-08-19 MED ORDER — DIVALPROEX SODIUM 250 MG PO DR TAB
500.0000 mg | DELAYED_RELEASE_TABLET | Freq: Every evening | ORAL | Status: DC
  Administered 2019-08-20: 500 mg via ORAL
  Filled 2019-08-19: qty 2

## 2019-08-19 MED ORDER — ASPIRIN 81 MG PO CHEW
324.0000 mg | CHEWABLE_TABLET | Freq: Once | ORAL | Status: AC
  Administered 2019-08-19: 13:00:00 324 mg via ORAL
  Filled 2019-08-19: qty 4

## 2019-08-19 MED ORDER — INSULIN ASPART 100 UNIT/ML ~~LOC~~ SOLN: 0.0000 [IU] | Freq: Three times a day (TID) | SUBCUTANEOUS | Status: DC

## 2019-08-19 MED ORDER — LACOSAMIDE 50 MG PO TABS
100.0000 mg | ORAL_TABLET | Freq: Two times a day (BID) | ORAL | Status: DC
  Administered 2019-08-19 – 2019-08-21 (×4): 100 mg via ORAL
  Filled 2019-08-19 (×4): qty 2

## 2019-08-19 MED ORDER — SODIUM CHLORIDE 0.9 % IV SOLN
INTRAVENOUS | Status: AC
  Administered 2019-08-19 (×4): via INTRAVENOUS

## 2019-08-19 MED ORDER — DULOXETINE HCL 30 MG PO CPEP
30.0000 mg | ORAL_CAPSULE | Freq: Two times a day (BID) | ORAL | Status: DC
  Administered 2019-08-19 – 2019-08-21 (×4): 30 mg via ORAL
  Filled 2019-08-19 (×4): qty 1

## 2019-08-19 MED ORDER — GABAPENTIN 100 MG PO CAPS
100.0000 mg | ORAL_CAPSULE | Freq: Two times a day (BID) | ORAL | Status: DC
  Administered 2019-08-19 – 2019-08-20 (×2): 100 mg via ORAL
  Filled 2019-08-19 (×2): qty 1

## 2019-08-19 MED ORDER — ACETAMINOPHEN 325 MG PO TABS
650.0000 mg | ORAL_TABLET | Freq: Four times a day (QID) | ORAL | Status: DC | PRN
  Administered 2019-08-20: 05:00:00 650 mg via ORAL
  Filled 2019-08-19: qty 2

## 2019-08-19 MED ORDER — HYDROXYZINE HCL 25 MG PO TABS: 25.0000 mg | ORAL_TABLET | Freq: Two times a day (BID) | ORAL | Status: DC | PRN

## 2019-08-19 MED ORDER — ROSUVASTATIN CALCIUM 20 MG PO TABS
40.0000 mg | ORAL_TABLET | Freq: Every day | ORAL | Status: DC
  Administered 2019-08-20 – 2019-08-21 (×2): 40 mg via ORAL
  Filled 2019-08-19 (×2): qty 2

## 2019-08-19 MED ORDER — FOLIC ACID 1 MG PO TABS
1.0000 mg | ORAL_TABLET | Freq: Every day | ORAL | Status: DC
  Administered 2019-08-20 – 2019-08-21 (×2): 1 mg via ORAL
  Filled 2019-08-19 (×2): qty 1

## 2019-08-19 NOTE — H&P (Signed)
Date: 08/19/2019               Patient Name:  Russell Carter MRN: [id number]  DOB: [birth year] Age / Sex: 63 y.o., male   PCP: Ladell Pier, MD              Medical Service: Internal Medicine Teaching Service              Attending Physician: Dr. Velna Ochs, MD    First Contact: Carolyne Littles, Medical Student  Pager: [phone]  Second Contact: Dr. Leonides Cave Pager: [phone]  Third Contact Dr. Sharon Seller Pager: [phone]       After Hours (After 5p/  First Contact Pager: [phone]  weekends / holidays): Second Contact Pager: [phone]   Chief Complaint: "Passed out, abdominal pain"  History of Present Illness:  Russell Carter is 63 year old male with PMH of seizures, CAD, stroke, heart block, cervical stenosis, left sided pain/weakness status post surgery, CKD, T2 diabetes mellitus, rectal bleeding, substance abuse, chronic opiate use that presented to [facility] ED due to passing out at home. Patient noted that he was at home on the toilet attempting to use the bathroom when he started becoming diaphoretic and seeing white spots. This occurred for 10 minutes until he finally passed out. Patient's fiancee called EMS, however, she did not see patient pass out. The next thing he remembers is briefly waking up in the EMS truck before passing out again until he finally awoke in the ED. Patient reported mild confusion upon regaining consciousness but denies lightheadedness/chest pain/SOB/nausea/vomiting. This is unlike previous seizures the patient has had before. Patient states that he has taken his seizure medication Depakote and Lacosamide every day without missing a dose. Only change in medications is that he stopped taking his folate a 3 months ago.   Patient also endorses severe abdominal pain. The pain developed over the past 3 weeks and is described as sharp, located in the LLQ/groin, 10/10 and does not radiate There is no nausea/vomiting/change in bowel frequency/blood in the stool. Patient  noted history of hernia repair and believes it has come back.   Over the past few weeks, patient has noted a difficulty peeing. He notes that when he attempts to urinate there is only a dribble/ notable stopping starting however there is no pain. Patient denies blood in the urine or change in urinary frequency.   Patient has stated that he lives at home with his fiance who has been sick with nausea/vomiting the past few days. Although he has not had these symptoms he believes he might also be sick.  Meds:  Current Meds  Medication Sig  . allopurinol (ZYLOPRIM) 300 MG tablet Take 1 tablet (300 mg total) by mouth daily.  Marland Kitchen amLODipine (NORVASC) 5 MG tablet Take 1 tablet (5 mg total) by mouth daily.  Marland Kitchen aspirin EC 81 MG tablet Take 1 tablet (81 mg total) by mouth daily.  . Calcium Carb-Cholecalciferol (CALCIUM-VITAMIN D) 600-400 MG-UNIT TABS Take 1 tablet by mouth daily.  . divalproex (DEPAKOTE) 500 MG DR tablet 1 tablet in the morning, 2 in the evening (Patient taking differently: Take 500-1,000 mg by mouth See admin instructions. Take 2 tablet in the morning and 1 in the evening)  . DULoxetine (CYMBALTA) 30 MG capsule Take 1 capsule (30 mg total) by mouth 2 (two) times daily.  Marland Kitchen gabapentin (NEURONTIN) 100 MG capsule Take 2 capsules (200 mg total) by mouth 2 (two) times daily. (Patient taking differently: Take 100 mg  by mouth 2 (two) times daily. )  . hydrOXYzine (ATARAX/VISTARIL) 25 MG tablet Take 1 tablet (25 mg total) by mouth 2 (two) times daily as needed. (Patient taking differently: Take 25 mg by mouth 2 (two) times daily as needed for anxiety. )  . losartan (COZAAR) 100 MG tablet Take 1 tablet (100 mg total) by mouth daily.  . meclizine (ANTIVERT) 25 MG tablet Take 25 mg by mouth 3 (three) times daily as needed for dizziness.   . methocarbamol (ROBAXIN) 750 MG tablet Take 1 tablet (750 mg total) by mouth every 8 (eight) hours as needed for muscle spasms.  . Multiple Vitamin (MULTIVITAMIN WITH  MINERALS) TABS tablet Take 1 tablet by mouth daily.  Marland Kitchen NARCAN 4 MG/0.1ML LIQD nasal spray kit Place 1 spray into the nose once.   . nitroGLYCERIN (NITROSTAT) 0.4 MG SL tablet Place 1 tablet (0.4 mg total) under the tongue every 5 (five) minutes as needed for chest pain.  Marland Kitchen oxyCODONE-acetaminophen (PERCOCET) 7.5-325 MG tablet Take 1 tablet by mouth 3 (three) times daily. (Patient taking differently: Take 1 tablet by mouth 5 (five) times daily. )  . rosuvastatin (CRESTOR) 40 MG tablet Take 1 tablet (40 mg total) by mouth daily.  Marland Kitchen testosterone cypionate (DEPOTESTOTERONE CYPIONATE) 100 MG/ML injection Inject 200 mg into the muscle every 28 (twenty-eight) days. For IM use only  . warfarin (COUMADIN) 5 MG tablet Take 2.5-5 mg by mouth See admin instructions. Take 1 tablet (21m) by mouth on Mon, Wed, and Fri. Take 1/2 tablet (2.531m on Sun, Tues, Thurs, and Sat.     Allergies: Allergies as of 08/19/2019 - Review Complete 08/19/2019  Allergen Reaction Noted  . Shellfish allergy Anaphylaxis 12/04/2011   Past Medical History:  Diagnosis Date  . Acute kidney injury ([facility]7/04/2018  . AKI (acute kidney injury) (HC[place]  . Anginal pain (HC[place]  . Anxiety   . Arthritis   . Bipolar disorder (HCG[place]  . Blood in stool   . CAD (coronary artery disease)    a. 2011: cath showing mod non-obstructive disease b. 02/2016: cath with 95% stenosis in the Mid RCA. DES placed.  . Cervical stenosis of spine   . Chronic anticoagulation 04/27/2018  . Chronic pain disorder 06/05/2017  . CKD (chronic kidney disease), stage III (HCOak[place]7/16/2019  . Cocaine abuse (HCMetz   history  . Complete heart block (HCGlen[facility]5/24/2017  . Depression   . Diabetes mellitus type 2 in nonobese (HCC)   . Diabetes mellitus without complication (HCBo[place]  . Dyslipidemia 03/15/2016  . Essential hypertension 05/18/2009   Qualifier: Diagnosis of  By: BaBurnett Kanaris  . Gastroesophageal reflux disease without esophagitis 07/31/2017  . Gout,  unspecified   . Headache   . History of CVA (cerebrovascular accident)   . History of pulmonary embolus (PE) 02/14/2017  . HTN (hypertension)   . Hypercholesterolemia   . Hyperglycemia   . Leukopenia 05/06/2018  . Migraines   . [facility]   drug cocaine and marijuana at time of mi none  since    . Normocytic anemia 05/06/2018  . Pneumonia   . Polysubstance abuse (HCYorkshire3/27/2014  . Rectal bleeding   . Renal insufficiency   . Seizure (HCSquaw [place]7/16/2019  . Seizures (HCCh[place]  . Stroke (HC[facility] on the [place]  . Vertigo    Family History:  Mother- MI x7 Father- MI Sister- SIDS Brother- bone cancer  Social History:  Patient states he is a never  smoker. He occasionally uses alcohol and his last drink was Saturday. He has never had withdrawal symptoms. Patient denies IVDU. He lives at home with his fiance and has acceptable support.   Review of Systems: A complete ROS was negative except as per HPI.   Physical Exam: Blood pressure (!) 155/93, pulse 64, temperature 97.7 F (36.5 C), temperature source Axillary, resp. rate 16, SpO2 98 %. General: well nourished, patient appears lethargic Head: normocephalic, atraumatic  Eyes: pupils pinpoint, vision grossly intact, extraocular eye movements intact Mouth: MMM, no erythema, no exudates, or lesions.  Neck: ROM limited by chronic neck pain, trachea midline, no palpable masses, no JVD, no carotid bruits.   Lung: CTAB, no wheezes/rales/rhonchi, pleuritic chest pain Heart: RRR., no murmurs/rubs/gallop, normal s1/s2 Abdomen: left sided groin pain, 10/10 in severity, guarding noted,  Left sided hernia palpated upon exam Neurologic: new onset left sided paresthesia/pain/weakness including the face, right hand paresthesia up to the elbow, shoulder pain radiating up the neck back of the neck Skin: turgor normal, no rashes Psych: Appropriate affect, mood normal  EKG: personally reviewed my interpretation is sinus rhythm with normal  axis/intervals, slight ST elevations in V2/V3  CXR: personally reviewed my interpretation is poor respiratory expansion with tracheal shift probably due to rotation, cardiac shadow appears enlarged with increased pleural opacities however this is difficult to appreciate due to low lung expansion  Assessment & Plan by Problem: Principal Problem:   Syncope Active Problems:   Essential hypertension   Gout   Cervical spondylosis with radiculopathy   Type 2 diabetes mellitus with hyperglycemia, without long-term current use of insulin (HCC)   Abdominal pain   Seizure (HCC)  A/P: Syncope: Seizure: At this point assessing for seizure vs syncope, patient's history of seizures as well as confusoin after the event are concerning for a post-ictal pattern. However, the patient notes that he has not missed a dose of his seizure medications and his presentation is classic for a vagal event. Patient was noted to have Depakote levels of 52 which is nearly subtherapeutic. We will consider MRI brain.  Plan:  -continue to assess for medication adherence or more appropriate dosing of anti-convulsants -Ordered Vimpat level -Continue Vimpat 222m BID -Continue Depakote 10019mam and 50064mm -Seizure precautions -Consider EEG if new event occurs  Abdominal Pain: Patient's severe abdominal pain is concerning for possible strangulated or incarcerated hernia. Patient's severe abdominal pain specifically around the time of fall is concerning for possible vasovagal syncope. At present no signs of acute abdomen.  Plan:  -obtain CT scan w/ and w/o contrast -Morphine 2mg91m hours, okay to escalate if needed  Anemia: Patient was initially noted to have normocytic anemia upon presentation (Hbg 9.7, MCV 94.4), also endorses fatigue and decreased po over the past week, patient denies source blood loss/melena/hematochezia.  Plan:  -upon repeat, CBC Hgb was normal 15.7 which is closer to his baseline as of 8/20,  will track Hgb over time -lab noted that CBC clotted and needed redraw, at this time the initial CBC appears to be lab error  -Further evaluation of anemia previously ordered prior to repeat CBC and pending.   Low Urine Output: Patient endorses low urine output over the past few weeks with decrease flow/hesitancy. Serum creatinine is also elevated at 1.41 raised from previous 1.26 as of 8/20. Plan:  -currently evaluating AKI vs progression of CKD -patient given 1 L of fluids as well as started on maintenance fluids -bladder scan to asssess for urinary retention -  continue to monitor creatinine to assess for possible kidney disease -Holding Losartan  Atrial Fibrillation: Hx of. Not in afib at this time. Given his initial concern for anemia and the possible head trauma in the setting of NSR and normal rate we will hold his coumadin tonight and restart tomorrow after reassessment. INR subtherapeutic at 1.2. -Holding coumadin   Diet: Regular Code: Full DVT PPX: SCD's (Will restart anticoagulation warfarin in am if stable) Dispo: Admit patient to Inpatient with expected length of stay greater than 2 midnights.  Signed: Kathi Ludwig, MD 08/19/2019, 7:08 PM  [facility], PGY-3 Pager # [phone]

## 2019-08-19 NOTE — ED Provider Notes (Signed)
[facility] EMERGENCY DEPARTMENT Provider Note   CSN: [id number] Arrival date & time: 08/19/19  1126     History   Chief Complaint Chief Complaint  Patient presents with  . Seizures    HPI Russell Carter is a 63 y.o. male.     63yo M w/ extensive PMH including seizure d/o, CAD, CKD, T2DM, substance abuse, bipolar d/o, chronic opiate use who p/w seizure.  EMS reports that the patient reportedly had a seizure at home and fianc called EMS.  Seizure was unwitnessed.  For EMS, the patient has been less responsive.  He had a brief seizure-like episode that resolved without medications.  Patient endorses pain in his posterior neck, is not sure whether he fell during seizure episode at home.  He reports 3 days of constant, central, nonradiating chest pressure that feels like someone is sitting on his chest.  No exacerbating factors but he did take nitroglycerin a few times that seem to help.  He reports associated shortness of breath.  He reports chronic cough that is unchanged recently.  No fevers or vomiting.  He denies alcohol or substance use.  The history is provided by the patient.  Seizures   Past Medical History:  Diagnosis Date  . Acute kidney injury ([place]) 04/27/2018  . AKI (acute kidney injury) ([place])   . Anginal pain ([place])   . Anxiety   . Arthritis   . Bipolar disorder ([place])   . Blood in stool   . CAD (coronary artery disease)    a. 2011: cath showing mod non-obstructive disease b. 02/2016: cath with 95% stenosis in the Mid RCA. DES placed.  . Cervical stenosis of spine   . Chronic anticoagulation 04/27/2018  . Chronic pain disorder 06/05/2017  . CKD (chronic kidney disease), stage III ([place]) 05/06/2018  . Cocaine abuse ([place])    history  . Complete heart block ([place]) 03/14/2016  . Depression   . Diabetes mellitus type 2 in nonobese (HCC)   . Diabetes mellitus without complication ([place])   . Dyslipidemia 03/15/2016  . Essential hypertension 05/18/2009   Qualifier: Diagnosis of  By: Burnett Kanaris    . Gastroesophageal reflux disease without esophagitis 07/31/2017  . Gout, unspecified   . Headache   . History of CVA (cerebrovascular accident)   . History of pulmonary embolus (PE) 02/14/2017  . HTN (hypertension)   . Hypercholesterolemia   . Hyperglycemia   . Leukopenia 05/06/2018  . Migraines   . Myocardial infarction [facility]) 2008   drug cocaine and marijuana at time of mi none  since    . Normocytic anemia 05/06/2018  . Pneumonia   . Polysubstance abuse ([place]) 01/15/2013  . Rectal bleeding   . Renal insufficiency   . Seizure ([place]) 05/06/2018  . Seizures ([place])   . Stroke ([place])   . Vertigo     Patient Active Problem List   Diagnosis Date Noted  . Stroke ([place]) 12/24/2018  . CKD (chronic kidney disease), stage III 05/06/2018  . Seizure ([place]) 05/06/2018  . Normocytic anemia 05/06/2018  . Leukopenia 05/06/2018  . AKI (acute kidney injury) ([place])   . Acute kidney injury ([place]) 04/27/2018  . Abdominal pain 04/27/2018  . Chronic anticoagulation 04/27/2018  . Left knee pain 01/10/2018  . Numbness and tingling in both hands 07/31/2017  . Gastroesophageal reflux disease without esophagitis 07/31/2017  . Chronic pain disorder 06/05/2017  . History of pulmonary embolus (PE) 02/14/2017  . Diabetes mellitus type 2 in nonobese (HCC)   .  Cocaine abuse ([place])   . Depression   . History of CVA (cerebrovascular accident)   . Cervical spondylosis with radiculopathy 01/24/2017  . Dyslipidemia 03/15/2016  . Cervical spinal stenosis 03/14/2016  . CAD-50% LAD 2011 12/14/2014  . Gout 11/16/2014  . Polysubstance abuse ([place]) 01/15/2013  . DIZZINESS 06/05/2010  . Essential hypertension 05/18/2009    Past Surgical History:  Procedure Laterality Date  . ANTERIOR CERVICAL DECOMP/DISCECTOMY FUSION N/A 01/24/2017   Procedure: Cervical Four-Five,Cervical Five Six,Cervical Six-Seven Anterior cervical discectomy with fusion and plate with Cervical  Three-Thoracic- One Laminectomy/Foraminotomy with Cervical Three-Thoracic- One Fixation and fusion;  Surgeon: Kevan Ny Ditty, MD;  Location: [facility];  Service: Neurosurgery;  Laterality: N/A;  Part-1 anterior approach  . CARDIAC CATHETERIZATION  2008; ~ 2011   [place]-40%lad  . CARDIAC CATHETERIZATION N/A 03/16/2016   Procedure: Left Heart Cath and Coronary Angiography;  Surgeon: Leonie Man, MD;  Location: [facility] CV LAB;  Service: Cardiovascular;  Laterality: N/A;  . COLONOSCOPY WITH PROPOFOL N/A 04/17/2018   Procedure: COLONOSCOPY WITH PROPOFOL;  Surgeon: Wonda Horner, MD;  Location[facility] ENDOSCOPY;  Service: Endoscopy;  Laterality: N/A;  . CORONARY ANGIOPLASTY     STENTS  . EYE SURGERY Bilateral    "for double vision"  . INGUINAL HERNIA REPAIR  05/12/2012   Procedure: HERNIA REPAIR INGUINAL ADULT;  Surgeon: Zenovia Jarred, MD;  Location: [facility];  Service: General;  Laterality: Right;  . INGUINAL HERNIA REPAIR Left 02/02/2003   Dr. Georganna Skeans. repair with mesh. Same procedure done again in  05/22/2004   . INGUINAL HERNIA REPAIR Left ?date 2nd OR  . LEFT HEART CATH AND CORONARY ANGIOGRAPHY N/A 04/29/2018   Procedure: LEFT HEART CATH AND CORONARY ANGIOGRAPHY;  Surgeon: Martinique, Peter M, MD;  Location: [facility] CV LAB;  Service: Cardiovascular;  Laterality: N/A;  . POSTERIOR CERVICAL FUSION/FORAMINOTOMY N/A 01/24/2017   Procedure: Cervical Three-Thoracic One-Laminectomy/Foraminotomy with Cervical Three-Thoracic One Fixation and fusion;  Surgeon: Kevan Ny Ditty, MD;  Location: [facility];  Service: Neurosurgery;  Laterality: N/A;  Part-2 Posterior approach  . RECONSTRUCT / STABILIZE DISTAL ULNA    . SHOULDER OPEN ROTATOR CUFF REPAIR Left 2010  . SHOULDER SURGERY Left    "injured on the job; had to cut small bone out"  . THUMB FUSION Right         Home Medications    Prior to Admission medications   Medication Sig Start Date End Date Taking? Authorizing  Provider  allopurinol (ZYLOPRIM) 300 MG tablet Take 1 tablet (300 mg total) by mouth daily. 02/18/19  Yes Ladell Pier, MD  amLODipine (NORVASC) 5 MG tablet Take 1 tablet (5 mg total) by mouth daily. 12/18/18  Yes Ladell Pier, MD  aspirin EC 81 MG tablet Take 1 tablet (81 mg total) by mouth daily. 12/26/18 12/26/19 Yes Vonzella Nipple, NP  Calcium Carb-Cholecalciferol (CALCIUM-VITAMIN D) 600-400 MG-UNIT TABS Take 1 tablet by mouth daily. 05/07/18  Yes Emokpae, Courage, MD  divalproex (DEPAKOTE) 500 MG DR tablet 1 tablet in the morning, 2 in the evening Patient taking differently: Take 500-1,000 mg by mouth See admin instructions. Take 2 tablet in the morning and 1 in the evening 06/02/19  Yes Kathrynn Ducking, MD  DULoxetine (CYMBALTA) 30 MG capsule Take 1 capsule (30 mg total) by mouth 2 (two) times daily. 01/20/19  Yes Ladell Pier, MD  gabapentin (NEURONTIN) 100 MG capsule Take 2 capsules (200 mg total) by mouth 2 (two)  times daily. Patient taking differently: Take 100 mg by mouth 2 (two) times daily.  05/15/19 04/04/20 Yes Domenic Polite, MD  hydrOXYzine (ATARAX/VISTARIL) 25 MG tablet Take 1 tablet (25 mg total) by mouth 2 (two) times daily as needed. Patient taking differently: Take 25 mg by mouth 2 (two) times daily as needed for anxiety.  06/04/19  Yes Ladell Pier, MD  losartan (COZAAR) 100 MG tablet Take 1 tablet (100 mg total) by mouth daily. 12/18/18  Yes Ladell Pier, MD  meclizine (ANTIVERT) 25 MG tablet Take 25 mg by mouth 3 (three) times daily as needed for dizziness.    Yes [provider]  methocarbamol (ROBAXIN) 750 MG tablet Take 1 tablet (750 mg total) by mouth every 8 (eight) hours as needed for muscle spasms. 05/07/18  Yes Roxan Hockey, MD  Multiple Vitamin (MULTIVITAMIN WITH MINERALS) TABS tablet Take 1 tablet by mouth daily. 05/07/18  Yes Roxan Hockey, MD  NARCAN 4 MG/0.1ML LIQD nasal spray kit Place 1 spray into the nose once.  02/12/19   Yes [provider]  nitroGLYCERIN (NITROSTAT) 0.4 MG SL tablet Place 1 tablet (0.4 mg total) under the tongue every 5 (five) minutes as needed for chest pain. 02/20/19  Yes Ladell Pier, MD  oxyCODONE-acetaminophen (PERCOCET) 7.5-325 MG tablet Take 1 tablet by mouth 3 (three) times daily. Patient taking differently: Take 1 tablet by mouth 5 (five) times daily.  12/26/18 12/26/19 Yes Vonzella Nipple, NP  rosuvastatin (CRESTOR) 40 MG tablet Take 1 tablet (40 mg total) by mouth daily. 04/21/19  Yes Ladell Pier, MD  testosterone cypionate (DEPOTESTOTERONE CYPIONATE) 100 MG/ML injection Inject 200 mg into the muscle every 28 (twenty-eight) days. For IM use only   Yes [provider]  warfarin (COUMADIN) 5 MG tablet Take 2.5-5 mg by mouth See admin instructions. Take 1 tablet ('5mg'$ ) by mouth on Mon, Wed, and Fri. Take 1/2 tablet (2.'5mg'$ ) on Sun, Tues, Thurs, and Sat. 01/01/19  Yes [provider]  folic acid (FOLVITE) 1 MG tablet Take 1 tablet (1 mg total) by mouth daily. Patient not taking: Reported on 08/19/2019 05/07/18   Roxan Hockey, MD  lacosamide 100 MG TABS Take 1 tablet (100 mg total) by mouth 2 (two) times daily. Patient not taking: Reported on 08/19/2019 05/15/19 04/04/20  Domenic Polite, MD  Vitamin D, Ergocalciferol, (DRISDOL) 1.25 MG (50000 UT) CAPS capsule Take 50,000 Units by mouth every Sunday.     [provider]    Family History Family History  Problem Relation Age of Onset  . Heart attack Mother        x7. Still alive  . Heart attack Father        deceased b/c of MI  . Heart attack Brother        older, deceased b/c of MI   . Cancer Brother        bone cancer in 1 brother, ? brain cancer in other brother  . Heart attack Brother   . Other Brother        MVA  . Other Brother   . Bone cancer Brother   . Cancer Brother   . SIDS Sister     Social History Social History   Tobacco Use  . Smoking status: Never Smoker  .  Smokeless tobacco: Never Used  Substance Use Topics  . Alcohol use: Not Currently    Comment: 16 oz beer every 2-3 days- states he does not drink  . Drug  use: Not Currently    Types: "Crack" cocaine, Marijuana, Cocaine    Comment:  "quit 2008"     Allergies   Shellfish allergy   Review of Systems Review of Systems  Neurological: Positive for seizures.   All other systems reviewed and are negative except that which was mentioned in HPI   Physical Exam Updated Vital Signs BP (!) 163/101   Pulse 77   Temp 97.7 F (36.5 C) (Axillary)   Resp 12   SpO2 99%   Physical Exam Vitals signs and nursing note reviewed. Exam conducted with a chaperone present.  Constitutional:      General: He is not in acute distress.    Appearance: He is well-developed.     Comments: Sleepy but arousable by voice  HENT:     Head: Normocephalic and atraumatic.  Eyes:     Extraocular Movements: Extraocular movements intact.     Conjunctiva/sclera: Conjunctivae normal.     Pupils: Pupils are equal, round, and reactive to light.     Comments: Pinpoint pupils  Neck:     Musculoskeletal: Neck supple.  Cardiovascular:     Rate and Rhythm: Normal rate and regular rhythm.     Heart sounds: Normal heart sounds. No murmur.  Pulmonary:     Effort: Pulmonary effort is normal.     Breath sounds: Normal breath sounds.  Abdominal:     General: Bowel sounds are normal. There is no distension.     Palpations: Abdomen is soft.     Tenderness: There is no abdominal tenderness.  Genitourinary:    Comments: Light brown stool, Hemoccult negative Skin:    General: Skin is warm and dry.  Neurological:     Mental Status: He is alert and oriented to person, place, and time.     Comments: Sleepy but arousable, oriented x 3, reluctant to follow commands but w/ coaching demonstrates 5/5 strength throughout; no clonus  Psychiatric:        Judgment: Judgment normal.      ED Treatments / Results  Labs (all  labs ordered are listed, but only abnormal results are displayed) Labs Reviewed  COMPREHENSIVE METABOLIC PANEL - Abnormal; Notable for the following components:      Result Value   CO2 21 (*)    Glucose, Bld 124 (*)    Creatinine, Ser 1.41 (*)    Calcium 8.5 (*)    Total Protein 6.0 (*)    Albumin 3.2 (*)    GFR calc non Af Amer 52 (*)    All other components within normal limits  ETHANOL - Abnormal; Notable for the following components:   Alcohol, Ethyl (B) 15 (*)    All other components within normal limits  CBC WITH DIFFERENTIAL/PLATELET - Abnormal; Notable for the following components:   WBC 3.1 (*)    RBC 3.40 (*)    Hemoglobin 9.7 (*)    HCT 32.1 (*)    Platelets 123 (*)    Lymphs Abs 0.5 (*)    All other components within normal limits  SARS CORONAVIRUS 2 (TAT 6-24 HRS)  VALPROIC ACID LEVEL  CBC WITH DIFFERENTIAL/PLATELET  RAPID URINE DRUG SCREEN, HOSP PERFORMED  PROTIME-INR  POC OCCULT BLOOD, ED  TYPE AND SCREEN  TROPONIN I (HIGH SENSITIVITY)  TROPONIN I (HIGH SENSITIVITY)    EKG EKG Interpretation  Date/Time:  Wednesday August 19 2019 11:27:16 EDT Ventricular Rate:  61 PR Interval:    QRS Duration: 97 QT Interval:  424 QTC Calculation:  428 R Axis:   38 Text Interpretation: Sinus rhythm Abnormal R-wave progression, early transition Minimal ST elevation, anterior leads No significant change since last tracing Confirmed by Theotis Burrow [phone]) on 08/19/2019 11:41:48 AM   Radiology Dg Chest 2 View  Result Date: 08/19/2019 CLINICAL DATA:  Chest pain for 3 days EXAM: CHEST - 2 VIEW COMPARISON:  06/11/2019 FINDINGS: Lung volumes are low. Cardiomediastinal contours are poorly evaluated but appear grossly unchanged from prior. Streaky bibasilar opacities. No large pleural fluid collection. No pneumothorax. IMPRESSION: Low lung volumes with streaky bibasilar opacities likely representing atelectasis. A superimposed infectious process would be difficult to exclude.  Electronically Signed   By: Davina Poke M.D.   On: 08/19/2019 12:29   Ct Head Wo Contrast  Result Date: 08/19/2019 CLINICAL DATA:  Seizure.  Possible fall EXAM: CT HEAD WITHOUT CONTRAST CT CERVICAL SPINE WITHOUT CONTRAST TECHNIQUE: Multidetector CT imaging of the head and cervical spine was performed following the standard protocol without intravenous contrast. Multiplanar CT image reconstructions of the cervical spine were also generated. COMPARISON:  None. CT head 06/11/2019 FINDINGS: CT HEAD FINDINGS Brain: Mild atrophy. Mild white matter disease which appears chronic. No acute infarct, hemorrhage, mass. Vascular: Negative for hyperdense vessel Skull: Negative Sinuses/Orbits: Negative Other: None CT CERVICAL SPINE FINDINGS Alignment: Normal Skull base and vertebrae: Negative for fracture. There is extensive artifact from anterior and posterior fusion hardware. Soft tissues and spinal canal: Negative Disc levels: ACDF C4 through C7 without complication. Posterior screw and rod fusion extending from C3 through T1. Hardware in good position. No hardware failure. Upper chest: Mild atelectasis right upper lobe. No acute abnormality Other: None IMPRESSION: 1. No acute intracranial abnormality 2. Anterior and posterior fusion hardware in the cervical spine. Considerable artifact from hardware. Allowing for this, no acute abnormality in the cervical spine. Electronically Signed   By: Franchot Gallo M.D.   On: 08/19/2019 13:03   Ct Cervical Spine Wo Contrast  Result Date: 08/19/2019 CLINICAL DATA:  Seizure.  Possible fall EXAM: CT HEAD WITHOUT CONTRAST CT CERVICAL SPINE WITHOUT CONTRAST TECHNIQUE: Multidetector CT imaging of the head and cervical spine was performed following the standard protocol without intravenous contrast. Multiplanar CT image reconstructions of the cervical spine were also generated. COMPARISON:  None. CT head 06/11/2019 FINDINGS: CT HEAD FINDINGS Brain: Mild atrophy. Mild white matter  disease which appears chronic. No acute infarct, hemorrhage, mass. Vascular: Negative for hyperdense vessel Skull: Negative Sinuses/Orbits: Negative Other: None CT CERVICAL SPINE FINDINGS Alignment: Normal Skull base and vertebrae: Negative for fracture. There is extensive artifact from anterior and posterior fusion hardware. Soft tissues and spinal canal: Negative Disc levels: ACDF C4 through C7 without complication. Posterior screw and rod fusion extending from C3 through T1. Hardware in good position. No hardware failure. Upper chest: Mild atelectasis right upper lobe. No acute abnormality Other: None IMPRESSION: 1. No acute intracranial abnormality 2. Anterior and posterior fusion hardware in the cervical spine. Considerable artifact from hardware. Allowing for this, no acute abnormality in the cervical spine. Electronically Signed   By: Franchot Gallo M.D.   On: 08/19/2019 13:03    Procedures Procedures (including critical care time)  Medications Ordered in ED Medications  naloxone [facility]) injection 0.4 mg (0.4 mg Intravenous Given 08/19/19 1155)  aspirin chewable tablet 324 mg (324 mg Oral Given 08/19/19 1318)  sodium chloride 0.9 % bolus 1,000 mL (0 mLs Intravenous Stopped 08/19/19 1539)     Initial Impression / Assessment and Plan / ED Course  I have  reviewed the triage vital signs and the nursing notes.  Pertinent labs & imaging results that were available during my care of the patient were reviewed by me and considered in my medical decision making (see chart for details).        Sleepy but arousable, was reluctant to follow neuro exam but with coaching showed full strength b/l. Because of pinpoint pupils and database showing chronic opiate prescription, gave narcan without much effect.  CT of head and C-spine negative acute.  Lab work shows WBC 3.1, creatinine 1.41, hemoglobin 9.7.  His hemoglobin has dropped significantly from lab work a few months ago.  He denies any melena or  bloody stools, Hemoccult negative here.  I do think that he is at risk for GI bleed given alcohol use but no evidence of acute GI bleed currently.  His troponin has been negative here but I am concerned about the possibility of unstable angina in the setting of low hemoglobin.  Discussed admission with Internal med teaching service and pt admitted for further care. Final Clinical Impressions(s) / ED Diagnoses   Final diagnoses:  Anemia, unspecified type  Seizure-like activity ([place])  Chest pain, unspecified type    ED Discharge Orders    None       Deavin Forst, Wenda Overland, MD 08/19/19 [phone]

## 2019-08-19 NOTE — ED Notes (Signed)
Patient transported to X-ray 

## 2019-08-19 NOTE — ED Notes (Signed)
Patient transported to CT 

## 2019-08-19 NOTE — ED Notes (Signed)
Lab called and cbc with diff clotted and need redraw.  RN [place] notified

## 2019-08-19 NOTE — ED Triage Notes (Signed)
Pt arrives via ems from home after seizure. Pt with hx of same. Pt also with cardiac hx with stent placement. Pt unresponsive en route. Ems reports approx 10 second seizure with them. 122/76, HR 50's with st elevation in V1 and V2. 97% RA.

## 2019-08-20 ENCOUNTER — Inpatient Hospital Stay (HOSPITAL_COMMUNITY): Payer: Medicare HMO

## 2019-08-20 ENCOUNTER — Other Ambulatory Visit: Payer: Self-pay

## 2019-08-20 DIAGNOSIS — R3916 Straining to void: Secondary | ICD-10-CM

## 2019-08-20 DIAGNOSIS — E1122 Type 2 diabetes mellitus with diabetic chronic kidney disease: Secondary | ICD-10-CM

## 2019-08-20 DIAGNOSIS — R55 Syncope and collapse: Secondary | ICD-10-CM | POA: Diagnosis not present

## 2019-08-20 DIAGNOSIS — M5412 Radiculopathy, cervical region: Secondary | ICD-10-CM

## 2019-08-20 DIAGNOSIS — R569 Unspecified convulsions: Secondary | ICD-10-CM | POA: Diagnosis not present

## 2019-08-20 DIAGNOSIS — I4891 Unspecified atrial fibrillation: Secondary | ICD-10-CM

## 2019-08-20 DIAGNOSIS — Z8673 Personal history of transient ischemic attack (TIA), and cerebral infarction without residual deficits: Secondary | ICD-10-CM

## 2019-08-20 DIAGNOSIS — F1910 Other psychoactive substance abuse, uncomplicated: Secondary | ICD-10-CM

## 2019-08-20 DIAGNOSIS — Z86711 Personal history of pulmonary embolism: Secondary | ICD-10-CM

## 2019-08-20 DIAGNOSIS — D61818 Other pancytopenia: Secondary | ICD-10-CM

## 2019-08-20 DIAGNOSIS — M4802 Spinal stenosis, cervical region: Secondary | ICD-10-CM

## 2019-08-20 DIAGNOSIS — R251 Tremor, unspecified: Secondary | ICD-10-CM

## 2019-08-20 DIAGNOSIS — Z91013 Allergy to seafood: Secondary | ICD-10-CM

## 2019-08-20 DIAGNOSIS — D72819 Decreased white blood cell count, unspecified: Secondary | ICD-10-CM

## 2019-08-20 DIAGNOSIS — Z7901 Long term (current) use of anticoagulants: Secondary | ICD-10-CM

## 2019-08-20 DIAGNOSIS — D649 Anemia, unspecified: Secondary | ICD-10-CM | POA: Diagnosis not present

## 2019-08-20 DIAGNOSIS — I2510 Atherosclerotic heart disease of native coronary artery without angina pectoris: Secondary | ICD-10-CM

## 2019-08-20 DIAGNOSIS — Z86718 Personal history of other venous thrombosis and embolism: Secondary | ICD-10-CM

## 2019-08-20 DIAGNOSIS — N401 Enlarged prostate with lower urinary tract symptoms: Secondary | ICD-10-CM

## 2019-08-20 DIAGNOSIS — K469 Unspecified abdominal hernia without obstruction or gangrene: Secondary | ICD-10-CM

## 2019-08-20 DIAGNOSIS — E785 Hyperlipidemia, unspecified: Secondary | ICD-10-CM

## 2019-08-20 DIAGNOSIS — F1190 Opioid use, unspecified, uncomplicated: Secondary | ICD-10-CM

## 2019-08-20 DIAGNOSIS — R3912 Poor urinary stream: Secondary | ICD-10-CM

## 2019-08-20 DIAGNOSIS — R1032 Left lower quadrant pain: Secondary | ICD-10-CM | POA: Diagnosis not present

## 2019-08-20 DIAGNOSIS — I129 Hypertensive chronic kidney disease with stage 1 through stage 4 chronic kidney disease, or unspecified chronic kidney disease: Secondary | ICD-10-CM

## 2019-08-20 DIAGNOSIS — R3911 Hesitancy of micturition: Secondary | ICD-10-CM

## 2019-08-20 DIAGNOSIS — E876 Hypokalemia: Secondary | ICD-10-CM

## 2019-08-20 DIAGNOSIS — N183 Chronic kidney disease, stage 3 unspecified: Secondary | ICD-10-CM

## 2019-08-20 DIAGNOSIS — N179 Acute kidney failure, unspecified: Secondary | ICD-10-CM

## 2019-08-20 LAB — COMPREHENSIVE METABOLIC PANEL
ALT: 13 U/L (ref 0–44)
AST: 13 U/L — ABNORMAL LOW (ref 15–41)
Albumin: 3.1 g/dL — ABNORMAL LOW (ref 3.5–5.0)
Alkaline Phosphatase: 38 U/L (ref 38–126)
Anion gap: 7 (ref 5–15)
BUN: 6 mg/dL — ABNORMAL LOW (ref 8–23)
CO2: 22 mmol/L (ref 22–32)
Calcium: 8.3 mg/dL — ABNORMAL LOW (ref 8.9–10.3)
Chloride: 109 mmol/L (ref 98–111)
Creatinine, Ser: 1.14 mg/dL (ref 0.61–1.24)
GFR calc Af Amer: >60 mL/min (ref >60)
GFR calc non Af Amer: >60 mL/min (ref >60)
Glucose, Bld: 124 mg/dL — ABNORMAL HIGH (ref 70–99)
Potassium: 3.4 mmol/L — ABNORMAL LOW (ref 3.5–5.1)
Sodium: 138 mmol/L (ref 135–145)
Total Bilirubin: 0.3 mg/dL (ref 0.3–1.2)
Total Protein: 5.7 g/dL — ABNORMAL LOW (ref 6.5–8.1)

## 2019-08-20 LAB — GLUCOSE, CAPILLARY
Glucose-Capillary: 101 mg/dL — ABNORMAL HIGH (ref 70–99)
Glucose-Capillary: 114 mg/dL — ABNORMAL HIGH (ref 70–99)
Glucose-Capillary: 104 mg/dL — ABNORMAL HIGH (ref 70–99)
Glucose-Capillary: 110 mg/dL — ABNORMAL HIGH (ref 70–99)

## 2019-08-20 LAB — CBC
HCT: 45.9 % (ref 39.0–52.0)
Hemoglobin: 14.7 g/dL (ref 13.0–17.0)
MCH: 28.4 pg (ref 26.0–34.0)
MCHC: 32 g/dL (ref 30.0–36.0)
MCV: 88.6 fL (ref 80.0–100.0)
Platelets: 223 10*3/uL (ref 150–400)
RBC: 5.18 MIL/uL (ref 4.22–5.81)
RDW: 14.7 % (ref 11.5–15.5)
WBC: 3.8 10*3/uL — ABNORMAL LOW (ref 4.0–10.5)
nRBC: 0 % (ref 0.0–0.2)

## 2019-08-20 LAB — PROTIME-INR
INR: 1.2 (ref 0.8–1.2)
Prothrombin Time: 15.2 seconds (ref 11.4–15.2)

## 2019-08-20 MED ORDER — ENOXAPARIN SODIUM 100 MG/ML ~~LOC~~ SOLN
1.0000 mg/kg | Freq: Two times a day (BID) | SUBCUTANEOUS | Status: DC
  Administered 2019-08-20 – 2019-08-21 (×2): 90 mg via SUBCUTANEOUS
  Filled 2019-08-20 (×3): qty 0.9

## 2019-08-20 MED ORDER — WARFARIN - PHARMACIST DOSING INPATIENT: Freq: Every day | Status: DC

## 2019-08-20 MED ORDER — ASPIRIN EC 81 MG PO TBEC
81.0000 mg | DELAYED_RELEASE_TABLET | Freq: Every day | ORAL | Status: DC
  Administered 2019-08-20 – 2019-08-21 (×2): 81 mg via ORAL
  Filled 2019-08-20 (×2): qty 1

## 2019-08-20 MED ORDER — TAMSULOSIN HCL 0.4 MG PO CAPS
0.4000 mg | ORAL_CAPSULE | Freq: Every day | ORAL | Status: DC
  Administered 2019-08-20 – 2019-08-21 (×2): 0.4 mg via ORAL
  Filled 2019-08-20 (×2): qty 1

## 2019-08-20 MED ORDER — OXYCODONE-ACETAMINOPHEN 7.5-325 MG PO TABS
1.0000 | ORAL_TABLET | Freq: Three times a day (TID) | ORAL | Status: DC
  Administered 2019-08-20 – 2019-08-21 (×3): 1 via ORAL
  Filled 2019-08-20 (×3): qty 1

## 2019-08-20 MED ORDER — GABAPENTIN 100 MG PO CAPS
200.0000 mg | ORAL_CAPSULE | Freq: Two times a day (BID) | ORAL | Status: DC
  Administered 2019-08-20 – 2019-08-21 (×2): 200 mg via ORAL
  Filled 2019-08-20 (×2): qty 2

## 2019-08-20 MED ORDER — LOSARTAN POTASSIUM 50 MG PO TABS
50.0000 mg | ORAL_TABLET | Freq: Every day | ORAL | Status: DC
  Administered 2019-08-20 – 2019-08-21 (×2): 50 mg via ORAL
  Filled 2019-08-20 (×2): qty 1

## 2019-08-20 MED ORDER — LOSARTAN POTASSIUM 50 MG PO TABS: 50.0000 mg | ORAL_TABLET | Freq: Every day | ORAL | Status: DC

## 2019-08-20 MED ORDER — WARFARIN SODIUM 7.5 MG PO TABS
7.5000 mg | ORAL_TABLET | Freq: Once | ORAL | Status: AC
  Administered 2019-08-20: 7.5 mg via ORAL
  Filled 2019-08-20: qty 1

## 2019-08-20 NOTE — Progress Notes (Signed)
Subjective:  Saw patient this morning during rounds. Patient continues to have unchanged LLQ abdominal/groin pain as well as pain/weakness/paresthesia on his entire left side. He relates that his abdominal pain is similar to a previous hernia located in the same area. He additionally stated he was lightheaded after standing up today. Patient was informed of CT findings indicating enlarged prostate and that this may be the cause of his urinary retention. He consented to PSA labs.  Objective:  Vital signs in last 24 hours: Vitals:   08/19/19 2019 08/19/19 2257 08/20/19 0416 08/20/19 0937  BP: (!) 155/93 (!) 171/97 (!) 162/87 (!) 167/94  Pulse: 64 61 67 74  Resp:  18 18 17   Temp:  98.8 F (37.1 C) 98 F (36.7 C) 98.2 F (36.8 C)  TempSrc:  Oral Oral Oral  SpO2:  99% 98% 98%  Weight:  91.2 kg    Height:  5\' 11"  (1.803 m)     Weight change:   Intake/Output Summary (Last 24 hours) at 08/20/2019 1021 Last data filed at 08/20/2019 0938 Gross per 24 hour  Intake 2054.48 ml  Output 475 ml  Net 1579.48 ml     CBC Latest Ref Rng & Units 08/20/2019 08/19/2019 08/19/2019  WBC 4.0 - 10.5 K/uL 3.8(L) 4.9 3.1(L)  Hemoglobin 13.0 - 17.0 g/dL 14.7 15.4 9.7(L)  Hematocrit 39.0 - 52.0 % 45.9 49.7 32.1(L)  Platelets 150 - 400 K/uL 223 245 123(L)   BMP Latest Ref Rng & Units 08/20/2019 08/19/2019 06/11/2019  Glucose 70 - 99 mg/dL 124(H) 124(H) 107(H)  BUN 8 - 23 mg/dL 6(L) 8 9  Creatinine 0.61 - 1.24 mg/dL 1.14 1.41(H) 1.26(H)  BUN/Creat Ratio 10 - 24 - - -  Sodium 135 - 145 mmol/L 138 138 137  Potassium 3.5 - 5.1 mmol/L 3.4(L) 3.8 3.9  Chloride 98 - 111 mmol/L 109 107 108  CO2 22 - 32 mmol/L 22 21(L) 20(L)  Calcium 8.9 - 10.3 mg/dL 8.3(L) 8.5(L) 8.7(L)      Physical Exam  Constitutional: He is oriented to person, place, and time. He appears well-developed and well-nourished.  HENT:  Head: Normocephalic and atraumatic.  Eyes: Pupils are equal, round, and reactive to light. EOM are  normal.  Neck: Normal range of motion.  Cardiovascular: Normal rate, regular rhythm, S1 normal and S2 normal. Exam reveals no gallop and no friction rub.  No murmur heard. Pulmonary/Chest: No respiratory distress. He has no wheezes. He has no rales.  Abdominal: Normal appearance. He exhibits no distension. There is abdominal tenderness in the left lower quadrant. There is guarding. A hernia is present.  No left sided testicular or inguinal hernia palpated  Musculoskeletal:        General: Tenderness (cervicle spine) present. No edema.  Neurological: He is alert and oriented to person, place, and time. He displays tremor. A sensory deficit is present.  Tremor and left sided sensory deficits are noted to be chronic from previous surgery Left sided pain with increased intensity since yesterday  Skin: Skin is warm and dry.    Patient Summary Patient is 63 year old male with PMH of seizures, CAD, stroke, heart block, cervical stenosis status post neck surgery with subsequent left sided pain/weakness, CKD, T2 diabetes mellitus, substance abuse, chronic opiate use that presented 10/28 to [facility] ED with vasovagal syncope due to abdominal pain and urinary retention.      Assessment/Plan:  Principal Problem:   Syncope Active Problems:   Essential hypertension   Gout  Cervical spondylosis with radiculopathy   Type 2 diabetes mellitus with hyperglycemia, without long-term current use of insulin (HCC)   Abdominal pain   Seizure (HCC)  Vasovagal Syncope -clinical presentation is more convincing of vasovagal syncope rather than seizure Plan:  -collect orthostatic vitals -attempt to resolve inciting factors for syncopal event  Abdominal Pain -patient continues to endorse severe abdominal pain similar to previous hernias -hernia was not appreciated on physical exam or CT scan in supine or standing positions Plan: -perform standing abdominal ultrasound looking for hernia -perform scrotal  ultrasound with Doppler   Urinary Retention -patient found to have enlarged prostate on CT measuring 5.5 cm -denies family history of prostate disease or previous prostate pathology -discussed benefits/risks of PSA measurement, patient understood and agreed to testing Plan: -obtain free and total PSA  -ordered Flomax (0.4 mg) to help relieve urinary retention  AKI -Serum creatinine was elevated at 1.41 on presentation, with fluid resuscitation patient's latest creatine corrected to 1.14 - AKI has resolved and appears to be due to pre-renal azotemia  Cervical Stenosis with Radiculopathy -patient has extensive history of left sided radicular pain following neck surgery in 2018 -patient notes current pain is worse than previous Plan: -increased dosage of Gabapentin from 100 mg BID to 200 mg BID  Atrial Fibrillation -patient has a history of Atrial Fibrillation and DVT/PE managed with chronic anticoagulation with warfarin (INR 1.2) -patient noted to have run out of warfarin for a few weeks -on telemetry patient was found to have completely normal rhythm without any episodes A fib Plan: -discontinue telemetry  -restart warfarin due to history of DVT/PE  Hypertension -patient has had elevated BP (systolic 167) the past 2 days  -patient normally takes both Losartan (100mg ) and Noravasc (5 mg) at home, lisinopril currently held Plan: -will consider restarting patient's Losartan  Hypokalemia: - mild hypokalemia of 3.4. Patient is on regular diet. No need to replace at this itme.  Plan - Recheck tomorrow morning.    LOS: 1 day   , Medical Student 08/20/2019, 10:21 AM

## 2019-08-20 NOTE — Progress Notes (Addendum)
ANTICOAGULATION CONSULT NOTE - Initial Consult  Pharmacy Consult for warfarin + enoxaparin Indication: atrial fibrillation  Allergies  Allergen Reactions  . Shellfish Allergy Anaphylaxis    Patient Measurements: Height: 5\' 11"  (180.3 cm) Weight: 201 lb 1 oz (91.2 kg) IBW/kg (Calculated) : 75.3  Vital Signs: Temp: 98.2 F (36.8 C) (10/29 1207) Temp Source: Oral (10/29 1207) BP: 173/93 (10/29 1207) Pulse Rate: 65 (10/29 1207)  Labs: Recent Labs    08/19/19 1158  08/19/19 1218 08/19/19 1404 08/19/19 1608 08/19/19 1725 08/20/19 0521  HGB  --    < > 9.7*  --   --  15.4 14.7  HCT  --   --  32.1*  --   --  49.7 45.9  PLT  --   --  123*  --   --  245 223  LABPROT  --   --   --   --  14.8  --  15.2  INR  --   --   --   --  1.2  --  1.2  CREATININE 1.41*  --   --   --   --   --  1.14  TROPONINIHS <2  --   --  <2  --   --   --    < > = values in this interval not displayed.    Estimated Creatinine Clearance: 75.6 mL/min (by C-G formula based on SCr of 1.14 mg/dL).   Medical History: Past Medical History:  Diagnosis Date  . Acute kidney injury (HCC) 04/27/2018  . AKI (acute kidney injury) (HCC)   . Anginal pain (HCC)   . Anxiety   . Arthritis   . Bipolar disorder (HCC)   . Blood in stool   . CAD (coronary artery disease)    a. 2011: cath showing mod non-obstructive disease b. 02/2016: cath with 95% stenosis in the Mid RCA. DES placed.  . Cervical stenosis of spine   . Chronic anticoagulation 04/27/2018  . Chronic pain disorder 06/05/2017  . CKD (chronic kidney disease), stage III (HCC) 05/06/2018  . Cocaine abuse (HCC)    history  . Complete heart block (HCC) 03/14/2016  . Depression   . Diabetes mellitus type 2 in nonobese (HCC)   . Diabetes mellitus without complication (HCC)   . Dyslipidemia 03/15/2016  . Essential hypertension 05/18/2009   Qualifier: Diagnosis of  By: 05/20/2009    . Gastroesophageal reflux disease without esophagitis 07/31/2017  . Gout,  unspecified   . Headache   . History of CVA (cerebrovascular accident)   . History of pulmonary embolus (PE) 02/14/2017  . HTN (hypertension)   . Hypercholesterolemia   . Hyperglycemia   . Leukopenia 05/06/2018  . Migraines   . Myocardial infarction [facility]) 2008   drug cocaine and marijuana at time of mi none  since    . Normocytic anemia 05/06/2018  . Pneumonia   . Polysubstance abuse (HCC) 01/15/2013  . Rectal bleeding   . Renal insufficiency   . Seizure (HCC) 05/06/2018  . Seizures (HCC)   . Stroke (HCC)   . Vertigo      Assessment: 89 yoM with PAF admitted with syncope. CBC demonstrated anemia on admit and warfarin held, now normalized. INR 1.2 today, pt reports he ran out of warfarin >1 week ago. Pt has hx VTE and CVA as well, so will bridge with enoxaparin.  *Home dose = 5mg  MWF, 2.5mg  TTSS  Goal of Therapy:  INR 2-3 Monitor platelets by anticoagulation protocol:  Yes   Plan:  -Warfarin 7.5mg  PO x1 tonight -Enoxaparin 1mg /kg (90mg ) q12h -Daily protime   Arrie Senate, PharmD, BCPS Clinical Pharmacist [phone] Please check AMION for all [place] numbers 08/20/2019

## 2019-08-21 DIAGNOSIS — R3916 Straining to void: Secondary | ICD-10-CM

## 2019-08-21 DIAGNOSIS — N189 Chronic kidney disease, unspecified: Secondary | ICD-10-CM

## 2019-08-21 DIAGNOSIS — M4802 Spinal stenosis, cervical region: Secondary | ICD-10-CM | POA: Diagnosis not present

## 2019-08-21 DIAGNOSIS — Z79899 Other long term (current) drug therapy: Secondary | ICD-10-CM

## 2019-08-21 DIAGNOSIS — R3911 Hesitancy of micturition: Secondary | ICD-10-CM | POA: Diagnosis not present

## 2019-08-21 DIAGNOSIS — I2510 Atherosclerotic heart disease of native coronary artery without angina pectoris: Secondary | ICD-10-CM | POA: Diagnosis not present

## 2019-08-21 DIAGNOSIS — R1032 Left lower quadrant pain: Secondary | ICD-10-CM | POA: Diagnosis not present

## 2019-08-21 DIAGNOSIS — N401 Enlarged prostate with lower urinary tract symptoms: Secondary | ICD-10-CM

## 2019-08-21 DIAGNOSIS — M5412 Radiculopathy, cervical region: Secondary | ICD-10-CM | POA: Diagnosis not present

## 2019-08-21 DIAGNOSIS — R569 Unspecified convulsions: Secondary | ICD-10-CM | POA: Diagnosis not present

## 2019-08-21 DIAGNOSIS — R55 Syncope and collapse: Secondary | ICD-10-CM | POA: Diagnosis not present

## 2019-08-21 DIAGNOSIS — R339 Retention of urine, unspecified: Secondary | ICD-10-CM | POA: Diagnosis not present

## 2019-08-21 DIAGNOSIS — I4891 Unspecified atrial fibrillation: Secondary | ICD-10-CM | POA: Diagnosis not present

## 2019-08-21 LAB — PSA, TOTAL AND FREE
PSA, Free Pct: 42.2 %
PSA, Free: 0.38 ng/mL
Prostate Specific Ag, Serum: 0.9 ng/mL (ref 0.0–4.0)

## 2019-08-21 LAB — BASIC METABOLIC PANEL
Anion gap: 10 (ref 5–15)
BUN: 10 mg/dL (ref 8–23)
CO2: 22 mmol/L (ref 22–32)
Calcium: 9 mg/dL (ref 8.9–10.3)
Chloride: 108 mmol/L (ref 98–111)
Creatinine, Ser: 1.23 mg/dL (ref 0.61–1.24)
GFR calc Af Amer: >60 mL/min (ref >60)
GFR calc non Af Amer: >60 mL/min (ref >60)
Glucose, Bld: 107 mg/dL — ABNORMAL HIGH (ref 70–99)
Potassium: 4.5 mmol/L (ref 3.5–5.1)
Sodium: 140 mmol/L (ref 135–145)

## 2019-08-21 LAB — CBC
HCT: 46.6 % (ref 39.0–52.0)
Hemoglobin: 14.6 g/dL (ref 13.0–17.0)
MCH: 28 pg (ref 26.0–34.0)
MCHC: 31.3 g/dL (ref 30.0–36.0)
MCV: 89.4 fL (ref 80.0–100.0)
Platelets: 232 10*3/uL (ref 150–400)
RBC: 5.21 MIL/uL (ref 4.22–5.81)
RDW: 14.4 % (ref 11.5–15.5)
WBC: 4.1 10*3/uL (ref 4.0–10.5)
nRBC: 0 % (ref 0.0–0.2)

## 2019-08-21 LAB — GLUCOSE, CAPILLARY
Glucose-Capillary: 93 mg/dL (ref 70–99)
Glucose-Capillary: 91 mg/dL (ref 70–99)
Glucose-Capillary: 97 mg/dL (ref 70–99)

## 2019-08-21 LAB — PROTIME-INR
INR: 1.2 (ref 0.8–1.2)
Prothrombin Time: 15 seconds (ref 11.4–15.2)

## 2019-08-21 MED ORDER — WARFARIN SODIUM 5 MG PO TABS: 5.0000 mg | ORAL_TABLET | Freq: Every day | ORAL | 0 refills | Status: DC

## 2019-08-21 MED ORDER — WARFARIN SODIUM 7.5 MG PO TABS: 7.5000 mg | ORAL_TABLET | Freq: Every day | ORAL | 0 refills | Status: DC

## 2019-08-21 MED ORDER — WARFARIN SODIUM 7.5 MG PO TABS: 7.5000 mg | ORAL_TABLET | Freq: Once | ORAL | Status: DC

## 2019-08-21 MED ORDER — TAMSULOSIN HCL 0.4 MG PO CAPS: 0.4000 mg | ORAL_CAPSULE | Freq: Every day | ORAL | 2 refills | Status: DC

## 2019-08-21 NOTE — Progress Notes (Signed)
Subjective:  Saw patient this morning during rounds. Patient reports no improvement in  abdominal pain or left sided sided neuropathic pain/weakness/paresthesia. Notified patient of normal PSA values and that there is no concern of prostate cancer at this time. Educated patient about BPH, his symptoms of urinary retention, and how Flomax can help relieve symptoms. Investigated why patient was on coumadin as opposed to NOAC for atrial fibrillation and patient believed he had clotting while on the medication.   Objective:  Vital signs in last 24 hours: Vitals:   08/20/19 1719 08/20/19 2027 08/21/19 0007 08/21/19 0414  BP: (!) 160/97 (!) 171/98 (!) 164/90 138/88  Pulse: 81 90 83 69  Resp: 20 18 18 18   Temp: 98.3 F (36.8 C) 98.8 F (37.1 C) 98.5 F (36.9 C) 97.9 F (36.6 C)  TempSrc: Oral Oral Oral Oral  SpO2: 100% 98% 100% 100%  Weight:      Height:       Weight change:   Intake/Output Summary (Last 24 hours) at 08/21/2019 1117 Last data filed at 08/20/2019 2329 Gross per 24 hour  Intake 1200 ml  Output 725 ml  Net 475 ml     CBC Latest Ref Rng & Units 08/21/2019 08/20/2019 08/19/2019  WBC 4.0 - 10.5 K/uL 4.1 3.8(L) 4.9  Hemoglobin 13.0 - 17.0 g/dL 08/21/2019 16.1 09.6  Hematocrit 39.0 - 52.0 % 46.6 45.9 49.7  Platelets 150 - 400 K/uL 232 223 245   BMP Latest Ref Rng & Units 08/21/2019 08/20/2019 08/19/2019  Glucose 70 - 99 mg/dL 08/21/2019) 409(W) 119(J)  BUN 8 - 23 mg/dL 10 6(L) 8  Creatinine 478(G - 1.24 mg/dL 9.56 2.13 0.86)  BUN/Creat Ratio 10 - 24 - - -  Sodium 135 - 145 mmol/L 140 138 138  Potassium 3.5 - 5.1 mmol/L 4.5 3.4(L) 3.8  Chloride 98 - 111 mmol/L 108 109 107  CO2 22 - 32 mmol/L 22 22 21(L)  Calcium 8.9 - 10.3 mg/dL 9.0 5.78(I) 6.9(G)     Examination Physical Exam  Constitutional: He appears well-developed and well-nourished.  HENT:  Head: Normocephalic and atraumatic.  Eyes: Pupils are equal, round, and reactive to light. Conjunctivae, EOM and lids are  normal.  Cardiovascular: Normal rate, regular rhythm, S1 normal, S2 normal and normal pulses.  Pulmonary/Chest: Effort normal and breath sounds normal. No respiratory distress.  Abdominal: Normal appearance. There is abdominal tenderness in the left lower quadrant. There is guarding. No hernia. Hernia confirmed negative in the left inguinal area.  Neurological: He is alert.  Tremor and left sided sensory deficits are noted to be chronic from previous surgery Unchanged left sided pain since yesterday   Skin: Skin is warm, dry and intact.    Patient Summary  Patient is 63 year old male with PMH of seizures, CAD, stroke, heart block, cervical stenosis status post neck surgery with subsequent left sided pain/weakness, CKD, T2 diabetes mellitus, substance abuse, chronic opiate use that presented 10/28 to [facility] ED with vasovagal syncope due to abdominal pain and urinary retention.   Patient's orthostatic vitals are negative. A direct cause of syncope could not be found. Causes of abdominal and neuropathic pain could also not be localized with negative imaging on CT and abdominal ultrasound. Urinary retention was found to be associated with BPH. Patient started on Flomax. Expecting to discharge today with instruction to follow up with PCP for further evaluation.   Assessment/Plan:  Principal Problem:   Syncope Active Problems:   Essential hypertension  Gout   Cervical spondylosis with radiculopathy   Type 2 diabetes mellitus with hyperglycemia, without long-term current use of insulin (HCC)   Abdominal pain   Seizure (HCC)   Syncope -patient was fluid resuscitated and lightheadedness resolved -PT/OT evaluated patient and ruled that he is okay to be discharged but recommended follow up with PT as an outpatient  Abdominal pain Plan: -recommended patient follow up with PCP regarding abdominal pain as no specific cause for pain could be found  BPH -patient started on Flomax and has  had slightly improved urine output over the past day Plan: -continue Flomax -follow up with PCP and urologist to further address BPH if symptoms persist  VTE/PE prophylaxis -patient stated he takes warfarin for A fib and VTE/PE prohylaxis instead of Eliquis or Xarelto, when previously on NOAC he reportedly had clot which is why he currently takes warfarin Plan:  -per pharmacy consult, started patient on warfarin    LOS: 2 days   Carolyne Littles, Medical Student 08/21/2019, 11:17 AM

## 2019-08-21 NOTE — Evaluation (Signed)
Physical Therapy Evaluation Patient Details Name: Russell Carter MRN: [id number] DOB: 02/19/1956 Today's Date: 08/21/2019   History of Present Illness  Patient is 63 year old male s/p syncope due to abdominal pain and urinary retention. PMH of seizures, CAD, stroke, heart block, cervical stenosis status post neck surgery with subsequent left sided pain/weakness, CKD, T2 diabetes mellitus, substance abuse, chronic opiate use  Clinical Impression  Pt admitted with above. Pt presenting close to functional baseline, besides report of new abdominal pain. Ambulating 200 feet with cane and negotiating 3 steps at a modI level. Denies lightheadedness/dizziness. Has chronic cervical and thoracic pain in addition to gait abnormalities. States he has benefited from OPPT in the past for pain control/management; recommending follow up. No further acute PT needs, PT signing off.     Follow Up Recommendations Outpatient PT    Equipment Recommendations  3in1 (PT);Cane    Recommendations for Other Services       Precautions / Restrictions Precautions Precautions: Fall Restrictions Weight Bearing Restrictions: No      Mobility  Bed Mobility Overal bed mobility: Modified Independent                Transfers Overall transfer level: Modified independent Equipment used: Straight cane                Ambulation/Gait Ambulation/Gait assistance: Modified independent (Device/Increase time) Gait Distance (Feet): 200 Feet Assistive device: Straight cane Gait Pattern/deviations: Step-through pattern;Decreased dorsiflexion - left Gait velocity: decreased   General Gait Details: Slower speed, no evidence of gross imbalance. Left foot internal rotation, decreased heel strike (pt states this is baseline)  Stairs Stairs: Yes Stairs assistance: Modified independent (Device/Increase time) Stair Management: One rail Left;With cane Number of Stairs: 3 General stair comments: Good technique with  sequencing and use of cane  Wheelchair Mobility    Modified Rankin (Stroke Patients Only)       Balance Overall balance assessment: Mild deficits observed, not formally tested                                           Pertinent Vitals/Pain Pain Assessment: 0-10 Pain Score: 7  Pain Location: cervical and thoracic; R side and L shoulder Pain Descriptors / Indicators: Discomfort Pain Intervention(s): Monitored during session    Home Living Family/patient expects to be discharged to:: Private residence Living Arrangements: Spouse/significant other Available Help at Discharge: Available 24 hours/day;Family Type of Home: House Home Access: Stairs to enter Entrance Stairs-Rails: Psychiatric nurse of Steps: 3 Home Layout: One level Home Equipment: Cane - single point      Prior Function Level of Independence: Independent with assistive device(s)         Comments: cane for walking     Hand Dominance   Dominant Hand: Right    Extremity/Trunk Assessment   Upper Extremity Assessment LUE Deficits / Details: L shoulder pain, but AROM is WFLs.    Lower Extremity Assessment Lower Extremity Assessment: Overall WFL for tasks assessed    Cervical / Trunk Assessment Cervical / Trunk Assessment: Normal  Communication   Communication: No difficulties  Cognition Arousal/Alertness: Awake/alert Behavior During Therapy: WFL for tasks assessed/performed Overall Cognitive Status: Within Functional Limits for tasks assessed  General Comments      Exercises     Assessment/Plan    PT Assessment Patent does not need any further PT services  PT Problem List         PT Treatment Interventions      PT Goals (Current goals can be found in the Care Plan section)  Acute Rehab PT Goals Patient Stated Goal: to go home PT Goal Formulation: All assessment and education complete, DC  therapy    Frequency     Barriers to discharge        Co-evaluation               AM-PAC PT "6 Clicks" Mobility  Outcome Measure Help needed turning from your back to your side while in a flat bed without using bedrails?: None Help needed moving from lying on your back to sitting on the side of a flat bed without using bedrails?: None Help needed moving to and from a bed to a chair (including a wheelchair)?: None Help needed standing up from a chair using your arms (e.g., wheelchair or bedside chair)?: None Help needed to walk in hospital room?: None Help needed climbing 3-5 steps with a railing? : None 6 Click Score: 24    End of Session   Activity Tolerance: Patient tolerated treatment well Patient left: in bed;with call bell/phone within reach Nurse Communication: Mobility status PT Visit Diagnosis: Difficulty in walking, not elsewhere classified (R26.2)    Time: 1045-1100 PT Time Calculation (min) (ACUTE ONLY): 15 min   Charges:   PT Evaluation $PT Eval Moderate Complexity: 1 Mod          Laurina Bustle, PT, DPT Acute Rehabilitation Services Pager [phone] Office [phone]   Vanetta Mulders 08/21/2019, 12:27 PM

## 2019-08-21 NOTE — Progress Notes (Signed)
ANTICOAGULATION CONSULT NOTE - Initial Consult  Pharmacy Consult for warfarin + enoxaparin Indication: atrial fibrillation  Allergies  Allergen Reactions  . Shellfish Allergy Anaphylaxis    Patient Measurements: Height: 5\' 11"  (180.3 cm) Weight: 201 lb 1 oz (91.2 kg) IBW/kg (Calculated) : 75.3  Vital Signs: Temp: 97.9 F (36.6 C) (10/30 0414) Temp Source: Oral (10/30 0414) BP: 138/88 (10/30 0414) Pulse Rate: 69 (10/30 0414)  Labs: Recent Labs    08/19/19 1158  08/19/19 1404 08/19/19 1608 08/19/19 1725 08/20/19 0521 08/21/19 0319  HGB  --    < >  --   --  15.4 14.7 14.6  HCT  --    < >  --   --  49.7 45.9 46.6  PLT  --    < >  --   --  245 223 232  LABPROT  --   --   --  14.8  --  15.2 15.0  INR  --   --   --  1.2  --  1.2 1.2  CREATININE 1.41*  --   --   --   --  1.14 1.23  TROPONINIHS <2  --  <2  --   --   --   --    < > = values in this interval not displayed.    Estimated Creatinine Clearance: 70.1 mL/min (by C-G formula based on SCr of 1.23 mg/dL).  Assessment: 16 yoM with PAF admitted with syncope. CBC demonstrated anemia on admit and warfarin held, now normalized. INR 1.2 today, pt reports he ran out of warfarin >1 week ago. Pt has hx VTE and CVA as well, so will bridge with enoxaparin.  *Home dose = 5mg  MWF, 2.5mg  TTSS  Goal of Therapy:  INR 2-3 Monitor platelets by anticoagulation protocol: Yes   Plan:  -Warfarin 7.5mg  PO x1 tonight -Enoxaparin 1mg /kg (90mg ) q12h -INR daily  Barth Kirks, PharmD, BCPS, BCCCP Clinical Pharmacist [phone]  Please check AMION for all [facility] numbers  08/21/2019 10:48 AM

## 2019-08-21 NOTE — TOC Transition Note (Signed)
Transition of Care [facility]) - CM/SW Discharge Note   Patient Details  Name: Clevland Cork MRN: [id number] Date of Birth: [birth year]  Transition of Care [facility] CM/SW Contact:  Pollie Friar, RN Phone Number: 08/21/2019, 2:53 PM   Clinical Narrative:    Pt discharging home with outpatient therapy. Pt chose to attend on [employer]. Orders in Epic and information on the AVS. DME to be delivered to the room. Pt states he has supervision at home and transportation to home.   Final next level of care: OP Rehab Barriers to Discharge: No Barriers Identified   Patient Goals and CMS Choice     Choice offered to / list presented to : Patient  Discharge Placement                       Discharge Plan and Services                DME Arranged: 3-N-1, Kasandra Knudsen DME Agency: [employer] Date DME Agency Contacted: 08/21/19   Representative spoke with at DME Agency: Zack            Social Determinants of Health (Icard) Interventions     Readmission Risk Interventions No flowsheet data found.

## 2019-08-21 NOTE — Evaluation (Signed)
Occupational Therapy Evaluation Patient Details Name: Russell Carter MRN: [id number] DOB: 1956/08/20 Today's Date: 08/21/2019    History of Present Illness Patient is 63 year old male s/p syncope due to abdominal pain and urinary retention. PMH of seizures, CAD, stroke, heart block, cervical stenosis status post neck surgery with subsequent left sided pain/weakness, CKD, T2 diabetes mellitus, substance abuse, chronic opiate use   Clinical Impression   Pt PTA: living with significant other. Pt currently independent with ADL and supervisionA for mobility. Pt usually uses SPC at home. Pt with L shoulder pain, but AROM in WFLs and strength 5/5 MM grade. Pt does not require continued OT skilled services as pt at his functional baseline. OT signing off.    Follow Up Recommendations  No OT follow up    Equipment Recommendations  None recommended by OT    Recommendations for Other Services       Precautions / Restrictions Precautions Precautions: Fall Restrictions Weight Bearing Restrictions: No      Mobility Bed Mobility Overal bed mobility: Modified Independent                Transfers Overall transfer level: Modified independent                    Balance Overall balance assessment: No apparent balance deficits (not formally assessed)                                         ADL either performed or assessed with clinical judgement   ADL Overall ADL's : Modified independent                                       General ADL Comments: Pt bathing at sink in sitting and standing; ambulating in room with no AD. Pt did not require any physical assist.     Vision Baseline Vision/History: Wears glasses Wears Glasses: At all times Patient Visual Report: No change from baseline Vision Assessment?: No apparent visual deficits     Perception     Praxis      Pertinent Vitals/Pain Pain Assessment: 0-10 Pain Score: 7  Pain  Location: back od spine near neck, R side and L shoulder Pain Descriptors / Indicators: Discomfort Pain Intervention(s): Limited activity within patient's tolerance     Hand Dominance Right   Extremity/Trunk Assessment Upper Extremity Assessment Upper Extremity Assessment: Overall WFL for tasks assessed;LUE deficits/detail LUE Deficits / Details: L shoulder pain, but AROM is WFLs.   Lower Extremity Assessment Lower Extremity Assessment: Defer to PT evaluation;Overall [facility] for tasks assessed   Cervical / Trunk Assessment Cervical / Trunk Assessment: Normal   Communication Communication Communication: No difficulties   Cognition Arousal/Alertness: Awake/alert Behavior During Therapy: WFL for tasks assessed/performed Overall Cognitive Status: Within Functional Limits for tasks assessed                                     General Comments  No dizziness reported today    Exercises     Shoulder Instructions      Home Living Family/patient expects to be discharged to:: Private residence Living Arrangements: Spouse/significant other Available Help at Discharge: Available 24 hours/day;Family Type of Home: House Home Access: Stairs  to enter Entrance Stairs-Number of Steps: 3 Entrance Stairs-Rails: Right;Left Home Layout: One level     Bathroom Shower/Tub: Teacher, early years/pre: Standard     Home Equipment: [place] - single point          Prior Functioning/Environment Level of Independence: Independent with assistive device(s)        Comments: cane for walking        OT Problem List:        OT Treatment/Interventions:      OT Goals(Current goals can be found in the care plan section) Acute Rehab OT Goals Patient Stated Goal: to go home OT Goal Formulation: With patient Time For Goal Achievement: 09/04/19 Potential to Achieve Goals: Good  OT Frequency:     Barriers to D/C:            Co-evaluation              AM-PAC  OT "6 Clicks" Daily Activity     Outcome Measure Help from another person eating meals?: None Help from another person taking care of personal grooming?: None Help from another person toileting, which includes using toliet, bedpan, or urinal?: None Help from another person bathing (including washing, rinsing, drying)?: None Help from another person to put on and taking off regular upper body clothing?: None Help from another person to put on and taking off regular lower body clothing?: None 6 Click Score: 24   End of Session Nurse Communication: Mobility status  Activity Tolerance: Patient tolerated treatment well Patient left: in bed;with call bell/phone within reach  OT Visit Diagnosis: Unsteadiness on feet (R26.81)                Time: 5170-0174 OT Time Calculation (min): 26 min Charges:  OT General Charges $OT Visit: 1 Visit OT Evaluation $OT Eval Moderate Complexity: 1 Mod OT Treatments $Self Care/Home Management : 8-22 mins  Darryl Nestle) Marsa Aris OTR/L Acute Rehabilitation Services Pager: [phone] Office: [place] 08/21/2019, 10:43 AM

## 2019-08-21 NOTE — Discharge Summary (Addendum)
Name: Russell Carter MRN: [id number] DOB: [birth year] 63 y.o. PCP: Russell Pier, MD  Date of Admission: 08/19/2019 11:26 AM Date of Discharge: 08/21/2019 Attending Physician: Dr. Philipp Carter  Discharge Diagnosis: 1. Vasovagal Syncope 2. BPH  Discharge Medications: Allergies as of 08/21/2019      Reactions   Shellfish Allergy Anaphylaxis      Medication List    STOP taking these medications   testosterone cypionate 100 MG/ML injection Commonly known as: DEPOTESTOTERONE CYPIONATE     TAKE these medications   allopurinol 300 MG tablet Commonly known as: ZYLOPRIM Take 1 tablet (300 mg total) by mouth daily.   amLODipine 5 MG tablet Commonly known as: NORVASC Take 1 tablet (5 mg total) by mouth daily.   aspirin EC 81 MG tablet Take 1 tablet (81 mg total) by mouth daily.   Calcium-Vitamin D 600-400 MG-UNIT Tabs Take 1 tablet by mouth daily.   divalproex 500 MG DR tablet Commonly known as: DEPAKOTE 1 tablet in the morning, 2 in the evening What changed:   how much to take  how to take this  when to take this  additional instructions   DULoxetine 30 MG capsule Commonly known as: Cymbalta Take 1 capsule (30 mg total) by mouth 2 (two) times daily.   folic acid 1 MG tablet Commonly known as: FOLVITE Take 1 tablet (1 mg total) by mouth daily.   gabapentin 100 MG capsule Commonly known as: NEURONTIN Take 2 capsules (200 mg total) by mouth 2 (two) times daily. What changed: how much to take   hydrOXYzine 25 MG tablet Commonly known as: ATARAX/VISTARIL Take 1 tablet (25 mg total) by mouth 2 (two) times daily as needed. What changed: reasons to take this   Lacosamide 100 MG Tabs Take 1 tablet (100 mg total) by mouth 2 (two) times daily.   losartan 100 MG tablet Commonly known as: COZAAR Take 1 tablet (100 mg total) by mouth daily.   meclizine 25 MG tablet Commonly known as: ANTIVERT Take 25 mg by mouth 3 (three) times daily as needed for dizziness.    methocarbamol 750 MG tablet Commonly known as: ROBAXIN Take 1 tablet (750 mg total) by mouth every 8 (eight) hours as needed for muscle spasms.   multivitamin with minerals Tabs tablet Take 1 tablet by mouth daily.   Narcan 4 MG/0.1ML Liqd nasal spray kit Generic drug: naloxone Place 1 spray into the nose once.   nitroGLYCERIN 0.4 MG SL tablet Commonly known as: NITROSTAT Place 1 tablet (0.4 mg total) under the tongue every 5 (five) minutes as needed for chest pain.   oxyCODONE-acetaminophen 7.5-325 MG tablet Commonly known as: Percocet Take 1 tablet by mouth 3 (three) times daily. What changed: when to take this   rosuvastatin 40 MG tablet Commonly known as: CRESTOR Take 1 tablet (40 mg total) by mouth daily.   tamsulosin 0.4 MG Caps capsule Commonly known as: FLOMAX Take 1 capsule (0.4 mg total) by mouth daily.   Vitamin D (Ergocalciferol) 1.25 MG (50000 UT) Caps capsule Commonly known as: DRISDOL Take 50,000 Units by mouth every Sunday.   warfarin 7.5 MG tablet Commonly known as: COUMADIN Take as directed. If you are unsure how to take this medication, talk to your nurse or doctor. Original instructions: Take 1 tablet (7.5 mg total) by mouth daily for 2 days. Take Warfarin 7.5 mg daily for 2 days then start taking Warfarin 5 mg until your next Anticoagulation Clinic appointment on 11/6. What changed:  medication strength  how much to take  when to take this  additional instructions   warfarin 5 MG tablet Commonly known as: Coumadin Take as directed. If you are unsure how to take this medication, talk to your nurse or doctor. Original instructions: Take 1 tablet (5 mg total) by mouth daily for 7 days. Take Warfarin 7.5 mg daily for 2 days then start taking Warfarin 5 mg until your next Anticoagulation Clinic appointment on 11/6. What changed: You were already taking a medication with the same name, and this prescription was added. Make sure you understand how  and when to take each.       Disposition and follow-up:   Mr.Russell Carter was discharged from [facility] in Stable condition.  At the hospital follow up visit please address:  1.  Follow-up:  (1) BPH - make sure patients LUT symptoms have improved on Flomax. May need Urology Follow-up.  (2) AKI - repeat BMP to make sure AKI has fully resolved.  (3) Anticoagulation - INR at anticoagulation clinic.  2.  Labs / imaging needed at time of follow-up: BMP, INR  3.  Pending labs/ test needing follow-up: none  Follow-up Appointments: Follow-up Information    [facility] Follow up.   Specialty: Rehabilitation Why: The outpatient rehab will contact you for the first appointment Contact information: [address] [phone] [facility] Course by problem list: [name] is 63 year old male with PMH of seizures, CAD, stroke, heart block, cervical stenosis, left sided pain/weakness status post surgery, CKD, T2 diabetes mellitus, substance abuse, chronic opiate use that presented to [facility] ED on 10/28 due to syncopal event. Patient additionally endorsed severe LLQ abdominal and urinary retention which is most likely the cause of the syncopal episode.   1. Vasovagal Syncope  Patient noted that he was at home on the toilet attempting to use the bathroom when he started becoming diaphoretic and seeing white spots. He subsequently passed out and was brought in by EMS, called by his fiance who did not witness the event. He doesn't remember much about the event however he notes this was different than seizures he's experienced in the past. Patient expresses diligence with anti-convulsant medication, taking it daily without missing a dose. On presentation, patient appeared mildly post-ictal but was conversant and cooperative. Head CT was negative for stroke or hemorrhage. EKG and  troponins were negative for ACS. Overall clinical presentation was more convincing for vasovagal syncope rather than seizure.  Upon discharge, patient's orthostatic vitals were normal. Per PT/OT recommendations patient is ready for discharge home with follow up for outpatient PT.   2. BPH  Patient endorsed urinary hesitancy/decreased flow over the past few weeks. On abdominal CT patient was found to have an enlarged prostate measuring 5.5 cm. PSA levels were normal. Patient discharged on Flow Max with instructions to follow up with PCP and urology for further management.   3. Abdominal Pain  Patient complains of LLQ abdominal pain 10/10 in severity that does not radiate. Patient believes this pain is similar to hernias he has had in the past that were surgically corrected. No hernia or mass was palpated upon exam. Similarly, CT Abdomen/pelvis as well as ultrasound were negative for hernias or other masses. Patient's syncopal event is believe to have resulted from this abdominal pain in conjunction with urinary retention from BPH. Patient encouraged to follow up this  abdominal pain with his PCP.  4. Cervical Stenosis with Radiculopathy  Patient complains of worsening left sided radicular pain accompanied by weakness/paresthesia. Patient endorses this pain is similar to what he experienced post-surgically following cervical stenosis. Patient's Gabapentin was increased from 100 mg BID to 200 mg BID. Patient encouraged to follow up with PCP   5. AKI  Patient presented with serum creatinine elevated at 1.41 on presentation. This was increased from his previous baseline on 8/20 of 1.26. With fluid resuscitation patient's latest creatine corrected to 1.14 - AKI has resolved and appears to be due to pre-renal azotemia  6. VTE/PE Prophylaxis  Patient was previously on Warfarin due to history of DVT/PE and A fib although he hasn't taken it for >1 week. INR on presentation was 1.2  Patient states that  although he has used NOACs previously, he had clot while on the medication and was switched to Warfarin. Patient discharged with new warfarin prescription with instructions to follow up with PCP  7. Hypertension  Patient has had elevated BP measurements during this admission with systolic measurements >867 due to holding losartan. Home medication of Losartan (100 mg) and Noravasc (5 mg) will be restarted on discharge.    Discharge Vitals:   BP (!) 144/87 (BP Location: Left Arm)   Pulse 71   Temp 98.8 F (37.1 C) (Oral)   Resp 18   Ht '5\' 11"'$  (1.803 m)   Wt 91.2 kg   SpO2 98%   BMI 28.04 kg/m   Pertinent Labs, Studies, and Procedures:  CBC Latest Ref Rng & Units 08/21/2019 08/20/2019 08/19/2019  WBC 4.0 - 10.5 K/uL 4.1 3.8(L) 4.9  Hemoglobin 13.0 - 17.0 g/dL 14.6 14.7 15.4  Hematocrit 39.0 - 52.0 % 46.6 45.9 49.7  Platelets 150 - 400 K/uL 232 223 245   CMP Latest Ref Rng & Units 08/21/2019 08/20/2019 08/19/2019  Glucose 70 - 99 mg/dL 107(H) 124(H) 124(H)  BUN 8 - 23 mg/dL 10 6(L) 8  Creatinine 0.61 - 1.24 mg/dL 1.23 1.14 1.41(H)  Sodium 135 - 145 mmol/L 140 138 138  Potassium 3.5 - 5.1 mmol/L 4.5 3.4(L) 3.8  Chloride 98 - 111 mmol/L 108 109 107  CO2 22 - 32 mmol/L 22 22 21(L)  Calcium 8.9 - 10.3 mg/dL 9.0 8.3(L) 8.5(L)  Total Protein 6.5 - 8.1 g/dL - 5.7(L) 6.0(L)  Total Bilirubin 0.3 - 1.2 mg/dL - 0.3 0.6  Alkaline Phos 38 - 126 U/L - 38 38  AST 15 - 41 U/L - 13(L) 18  ALT 0 - 44 U/L - 13 15    Discharge Instructions: Discharge Instructions    Ambulatory referral to Physical Therapy   Complete by: As directed    Diet - low sodium heart healthy   Complete by: As directed    Discharge instructions   Complete by: As directed    You were hospitalized for Syncope. Thank you for allowing [place] to be part of your care.   We arranged for you to follow up at:   (1) Anticoagulation Clinic at the [facility] on 11/06 at 1:30pm   (2) Hospital follow up  appointment at the [facility] on 11/16 at 10:00am   Please note these changes made to your medications:   Please START taking:    Please STOP taking:    Please make sure to follow up at your clinic appointments and take your Warfarin as prescribed.  Please call our clinic if you have  any questions or concerns, we may be able to help and keep you from a long and expensive emergency room wait. Our clinic and after hours phone number is [phone], the best time to call is Monday through Friday 9 am to 4 pm but there is always someone available 24/7 if you have an emergency. If you need medication refills please notify your pharmacy one week in advance and they will send [place] a request.   Increase activity slowly   Complete by: As directed       Signed: Marianna Payment, MD 08/25/2019, 11:37 AM   Pager: [phone]

## 2019-08-21 NOTE — Progress Notes (Signed)
Pt d/c home no new concerns, pt is stable on room air, no s/s of distress, alert and oriented x 4. D/c instructions done, pt verbalize understanding. Pt will be transported out of facility by family.

## 2019-08-22 LAB — LACOSAMIDE: Lacosamide: 6.5 ug/mL (ref 5.0–10.0)

## 2019-08-26 DIAGNOSIS — E559 Vitamin D deficiency, unspecified: Secondary | ICD-10-CM | POA: Diagnosis not present

## 2019-08-26 DIAGNOSIS — M25511 Pain in right shoulder: Secondary | ICD-10-CM | POA: Diagnosis not present

## 2019-08-26 DIAGNOSIS — Z79899 Other long term (current) drug therapy: Secondary | ICD-10-CM | POA: Diagnosis not present

## 2019-08-26 DIAGNOSIS — I10 Essential (primary) hypertension: Secondary | ICD-10-CM | POA: Diagnosis not present

## 2019-08-26 DIAGNOSIS — M25512 Pain in left shoulder: Secondary | ICD-10-CM | POA: Diagnosis not present

## 2019-08-26 DIAGNOSIS — R7989 Other specified abnormal findings of blood chemistry: Secondary | ICD-10-CM | POA: Diagnosis not present

## 2019-08-26 DIAGNOSIS — M129 Arthropathy, unspecified: Secondary | ICD-10-CM | POA: Diagnosis not present

## 2019-08-28 ENCOUNTER — Other Ambulatory Visit: Payer: Self-pay | Admitting: Internal Medicine

## 2019-08-28 ENCOUNTER — Ambulatory Visit: Payer: Medicare HMO | Admitting: Pharmacist

## 2019-08-28 ENCOUNTER — Other Ambulatory Visit: Payer: Self-pay

## 2019-08-28 ENCOUNTER — Telehealth: Payer: Self-pay | Admitting: Internal Medicine

## 2019-08-28 ENCOUNTER — Other Ambulatory Visit: Payer: Self-pay | Admitting: Family Medicine

## 2019-08-28 DIAGNOSIS — F329 Major depressive disorder, single episode, unspecified: Secondary | ICD-10-CM

## 2019-08-28 DIAGNOSIS — Z86711 Personal history of pulmonary embolism: Secondary | ICD-10-CM

## 2019-08-28 DIAGNOSIS — F32A Depression, unspecified: Secondary | ICD-10-CM

## 2019-08-28 LAB — POCT INR: INR: 1.4 — AB (ref 2.0–3.0)

## 2019-08-28 MED ORDER — WARFARIN SODIUM 5 MG PO TABS: ORAL_TABLET | ORAL | 2 refills | Status: DC

## 2019-08-28 MED ORDER — WARFARIN SODIUM 7.5 MG PO TABS: ORAL_TABLET | ORAL | 2 refills | Status: DC

## 2019-08-28 MED FILL — ALLOPURINOL 300 MG TAB: 300 | 30 days supply | Qty: 30 | Fill #0

## 2019-08-28 MED FILL — DULoxetine HCL 30 MG CPEP: 30 | 30 days supply | Qty: 60 | Fill #0

## 2019-08-28 MED FILL — hydrOXYzine HCL 25 MG TABS: 25 | 8 days supply | Qty: 30 | Fill #1

## 2019-08-28 MED FILL — DIVALPROEX SOD DR 500 MG TA: 500 | 30 days supply | Qty: 60 | Fill #1

## 2019-08-28 MED FILL — METHOCARBAMOL 750 MG TABS: 750 | 30 days supply | Qty: 90 | Fill #0

## 2019-08-28 MED FILL — METOCLOPRAMIDE 10 MG TABLET: 10 | 3 days supply | Qty: 10 | Fill #2

## 2019-08-28 MED FILL — NITROGLYCERIN 0.4 MG TAB SL: 0.4 | 25 days supply | Qty: 25 | Fill #1

## 2019-08-28 MED FILL — AMLODIPINE BESYLATE 5 MG TA: 5 | 30 days supply | Qty: 30 | Fill #0

## 2019-08-28 MED FILL — ROSUVASTATIN CALCIUM 40 MG: 40 | 30 days supply | Qty: 30 | Fill #1

## 2019-08-28 MED FILL — LOSARTAN POTASSIUM 100 MG T: 100 | 30 days supply | Qty: 30 | Fill #0

## 2019-08-28 MED FILL — WARFARIN SODIUM 5 MG TABLET: 5 | 30 days supply | Qty: 30 | Fill #0

## 2019-08-28 NOTE — Telephone Encounter (Signed)
Patient came and dropped off handicap form

## 2019-09-04 ENCOUNTER — Encounter: Payer: Medicare HMO | Admitting: Pharmacist

## 2019-09-05 NOTE — Progress Notes (Signed)
Subjective:    Patient ID: Russell Carter, male    DOB: 1956/04/12, 63 y.o.   MRN: [id number]  This is a 63 year old male with prior history of prediabetes, hypertension, hyperlipidemia, coronary disease, anxiety, gout, depression anxiety, history of seizures and substance abuse, history of pulmonary embolism on chronic anticoagulant therapy.  Patient was last seen in August of this year by the patient's primary care provider Dr. Wynetta Carter.  Prior to this the patient been hospitalized for seizures in July imaging showed no acute findings and Depakote was stopped and Vimpat was placed.  Patient then subsequent have follow-up in the emergency room 2 days prior to the patient's visit in August with headache and generalized weakness.  The patient was suffering from significant emotional upset as his daughter recently had perished from gunshot wounds from a boyfriend.  The patient had follow-up with his neurologist who recommended he return to Depakote and stay on the Vimpat as well.  He has been on both his medication but just ran out of the Vimpat a week ago and is needing refills as it is a controlled substance.  Note the patient also has been compliant with his blood pressure medications.  He then was admitted in October with a vasovagal syndrome related to acute urinary retention and benign prostatic hypertrophy.  Below is the discharge summary from that visit.  Date of Admission: 08/19/2019 11:26 AM Date of Discharge: 08/21/2019 Attending Physician: Dr. Philipp Carter  Discharge Diagnosis: 1. Vasovagal Syncope 2. BPH  Discharge Medications: Allergies as of 08/21/2019     Reactions  Shellfish Allergy Anaphylaxis    Disposition and follow-up:   RussellRussell Carter was discharged from [facility] in Stable condition.  At the hospital follow up visit please address:  1.  Follow-up:  (1) BPH - make sure patients LUT symptoms have improved on Flomax. May need Urology Follow-up.   (2) AKI - repeat BMP to make sure AKI has fully resolved.  (3) Anticoagulation - INR at anticoagulation clinic.  2.  Labs / imaging needed at time of follow-up: BMP, INR  3.  Pending labs/ test needing follow-up: none  Follow-up Appointments: Follow-up Information    [facility] Follow up.  Specialty: Rehabilitation Why: The outpatient rehab will contact you for the first appointment Contact information: [address] [id number] [place] [facility] Course by problem list: Russell Carter is 63 year old male with PMH of seizures, CAD, stroke, heart block, cervical stenosis, left sided pain/weakness status post surgery, CKD, T2 diabetes mellitus, substance abuse, chronic opiate use that presented to [facility] ED on 10/28 due to syncopal event. Patient additionally endorsed severe LLQ abdominal and urinary retention which is most likely the cause of the syncopal episode.   1. Vasovagal Syncope  Patient noted that he was at home on the toilet attempting to use the bathroom when he started becoming diaphoretic and seeing white spots. He subsequently passed out and was brought in by EMS, called by his fiance who did not witness the event. He doesn't remember much about the event however he notes this was different than seizures he's experienced in the past. Patient expresses diligence with anti-convulsant medication, taking it daily without missing a dose. On presentation, patient appeared mildly post-ictal but was conversant and cooperative. Head CT was negative for stroke or hemorrhage. EKG and troponins were negative for ACS. Overall clinical presentation was more convincing for vasovagal syncope rather than seizure.  Upon discharge, patient's  orthostatic vitals were normal. Per PT/OT recommendations patient is ready for discharge home with follow up for outpatient PT.   2. BPH  Patient endorsed  urinary hesitancy/decreased flow over the past few weeks. On abdominal CT patient was found to have an enlarged prostate measuring 5.5 cm. PSA levels were normal. Patient discharged on Flow Max with instructions to follow up with PCP and urology for further management.   3. Abdominal Pain  Patient complains of LLQ abdominal pain 10/10 in severity that does not radiate. Patient believes this pain is similar to hernias he has had in the past that were surgically corrected. No hernia or mass was palpated upon exam. Similarly, CT Abdomen/pelvis as well as ultrasound were negative for hernias or other masses. Patient's syncopal event is believe to have resulted from this abdominal pain in conjunction with urinary retention from BPH. Patient encouraged to follow up this abdominal pain with his PCP.  4. Cervical Stenosis with Radiculopathy  Patient complains of worsening left sided radicular pain accompanied by weakness/paresthesia. Patient endorses this pain is similar to what he experienced post-surgically following cervical stenosis. Patient's Gabapentin was increased from 100 mg BID to 200 mg BID. Patient encouraged to follow up with PCP   5. AKI  Patient presented with serum creatinine elevatedat 1.41on presentation. This was increased from his previous baseline on 8/20 of 1.26. With fluid resuscitation patient's latest creatine corrected to 1.14 - AKI has resolved and appears to be due to pre-renal azotemia  6. VTE/PE Prophylaxis  Patient was previously on Warfarin due to history of DVT/PE and A fib although he hasn't taken it for >1 week. INR on presentation was 1.2  Patient states that although he has used NOACs previously, he had clot while on the medication and was switched to Warfarin. Patient discharged with new warfarin prescription with instructions to follow up with PCP  7. Hypertension  Patient has had elevated BP measurements during this admission with systolic measurements  >850 due to holding losartan. Home medication of Losartan (100 mg) and Noravasc (5 mg) will be restarted on discharge.   Note since discharge he has been compliant with his blood pressure medications include the losartan and the amlodipine.  He however has not been compliant with taking his warfarin.  He is on the warfarin to prevent stroke and that he has had arrhythmias in the past and is high risk for recurrent stroke.  Also he had pulmonary emboli in the past.  He has never actually been on one of the NOAC agents and is interested in switching off warfarin.    Note the patient also has chronic pain with cervical and upper thoracic pain from prior surgery that failed.  He is followed in the [facility].  His pain physician is considering a referral another neurosurgical office.   Note the patient also had heavy alcohol use in the past had quit drinking but with the loss of his daughter has gone back to drinking this past summer.  He is attempting to quit but has withdrawal symptoms when he cuts back on the amount of alcohol he is consuming.   Note the patient's renal function have returned to normal during the last hospitalization.  Past Medical History:  Diagnosis Date  . Acute kidney injury ([place]) 04/27/2018  . AKI (acute kidney injury) ([place])   . Anginal pain ([place])   . Anxiety   . Arthritis   . Bipolar disorder ([place])   . Blood in stool   .  CAD (coronary artery disease)    a. 2011: cath showing mod non-obstructive disease b. 02/2016: cath with 95% stenosis in the Mid RCA. DES placed.  . Cervical stenosis of spine   . Chronic anticoagulation 04/27/2018  . Chronic pain disorder 06/05/2017  . CKD (chronic kidney disease), stage III 05/06/2018  . CKD (chronic kidney disease), stage III 05/06/2018  . Cocaine abuse ([place])    history  . Complete heart block ([place]) 03/14/2016  . Depression   . Diabetes mellitus type 2 in nonobese (HCC)   . Diabetes mellitus without complication ([place])    . Dyslipidemia 03/15/2016  . Essential hypertension 05/18/2009   Qualifier: Diagnosis of  By: Burnett Kanaris    . Gastroesophageal reflux disease without esophagitis 07/31/2017  . Gout, unspecified   . Headache   . History of CVA (cerebrovascular accident)   . History of pulmonary embolus (PE) 02/14/2017  . HTN (hypertension)   . Hypercholesterolemia   . Hyperglycemia   . Leukopenia 05/06/2018  . Migraines   . Myocardial infarction [facility]) 2008   drug cocaine and marijuana at time of mi none  since    . Normocytic anemia 05/06/2018  . Pneumonia   . Polysubstance abuse ([place]) 01/15/2013  . Rectal bleeding   . Renal insufficiency   . Seizure ([place]) 05/06/2018  . Seizures ([place])   . Stroke ([place])   . Vertigo      Family History  Problem Relation Age of Onset  . Heart attack Mother        x7. Still alive  . Heart attack Father        deceased b/c of MI  . Heart attack Brother        older, deceased b/c of MI   . Cancer Brother        bone cancer in 1 brother, ? brain cancer in other brother  . Heart attack Brother   . Other Brother        MVA  . Other Brother   . Bone cancer Brother   . Cancer Brother   . SIDS Sister      Social History   Socioeconomic History  . Marital status: Widowed    Spouse name: Not on file  . Number of children: Not on file  . Years of education: Not on file  . Highest education level: Not on file  Occupational History  . Not on file  Social Needs  . Financial resource strain: Not on file  . Food insecurity    Worry: Not on file    Inability: Not on file  . Transportation needs    Medical: Not on file    Non-medical: Not on file  Tobacco Use  . Smoking status: Never Smoker  . Smokeless tobacco: Never Used  Substance and Sexual Activity  . Alcohol use: Not Currently    Comment: 16 oz beer every 2-3 days- states he does not drink  . Drug use: Not Currently    Types: "Crack" cocaine, Marijuana, Cocaine    Comment:  "quit 2008"  .  Sexual activity: Not Currently  Lifestyle  . Physical activity    Days per week: Not on file    Minutes per session: Not on file  . Stress: Not on file  Relationships  . Social Herbalist on phone: Not on file    Gets together: Not on file    Attends religious service: Not on file    Active member  of club or organization: Not on file    Attends meetings of clubs or organizations: Not on file    Relationship status: Not on file  . Intimate partner violence    Fear of current or ex partner: Not on file    Emotionally abused: Not on file    Physically abused: Not on file    Forced sexual activity: Not on file  Other Topics Concern  . Not on file  Social History Narrative   Lives in [place] with his wife and son.      Allergies  Allergen Reactions  . Shellfish Allergy Anaphylaxis     Outpatient Medications Prior to Visit  Medication Sig Dispense Refill  . allopurinol (ZYLOPRIM) 300 MG tablet TAKE 1 TABLET (300 MG TOTAL) BY MOUTH DAILY. 30 tablet 0  . amLODipine (NORVASC) 5 MG tablet Take 1 tablet (5 mg total) by mouth daily. 30 tablet 2  . aspirin EC 81 MG tablet Take 1 tablet (81 mg total) by mouth daily. 30 tablet 2  . Calcium Carb-Cholecalciferol (CALCIUM-VITAMIN D) 600-400 MG-UNIT TABS Take 1 tablet by mouth daily. 30 tablet 5  . divalproex (DEPAKOTE) 500 MG DR tablet 1 tablet in the morning, 2 in the evening (Patient taking differently: Take 500-1,000 mg by mouth See admin instructions. Take 2 tablet in the morning and 1 in the evening) 270 tablet 1  . DULoxetine (CYMBALTA) 30 MG capsule TAKE 1 CAPSULE (30 MG TOTAL) BY MOUTH 2 (TWO) TIMES DAILY. 60 capsule 0  . folic acid (FOLVITE) 1 MG tablet Take 1 tablet (1 mg total) by mouth daily. 30 tablet 5  . gabapentin (NEURONTIN) 100 MG capsule Take 2 capsules (200 mg total) by mouth 2 (two) times daily. (Patient taking differently: Take 100 mg by mouth 2 (two) times daily. ) 60 capsule 1  . losartan (COZAAR) 100 MG  tablet TAKE 1 TABLET (100 MG TOTAL) BY MOUTH DAILY. 30 tablet 0  . metoCLOPramide (REGLAN) 10 MG tablet     . Multiple Vitamin (MULTIVITAMIN WITH MINERALS) TABS tablet Take 1 tablet by mouth daily. 30 tablet 5  . NARCAN 4 MG/0.1ML LIQD nasal spray kit Place 1 spray into the nose once.     . nitroGLYCERIN (NITROSTAT) 0.4 MG SL tablet Place 1 tablet (0.4 mg total) under the tongue every 5 (five) minutes as needed for chest pain. 25 tablet 2  . oxyCODONE-acetaminophen (PERCOCET) 7.5-325 MG tablet Take 1 tablet by mouth 3 (three) times daily. (Patient taking differently: Take 1 tablet by mouth 5 (five) times daily. ) 20 tablet 0  . rosuvastatin (CRESTOR) 40 MG tablet Take 1 tablet (40 mg total) by mouth daily. 30 tablet 6  . tamsulosin (FLOMAX) 0.4 MG CAPS capsule TAKE 1 CAPSULE BY MOUTH AT BEDTIME. 30 capsule 0  . testosterone cypionate (DEPOTESTOSTERONE CYPIONATE) 200 MG/ML injection Inject 200 mg into the muscle every 30 (thirty) days.    . Vitamin D, Ergocalciferol, (DRISDOL) 1.25 MG (50000 UT) CAPS capsule Take 50,000 Units by mouth every Sunday.     . hydrOXYzine (ATARAX/VISTARIL) 25 MG tablet Take 1 tablet (25 mg total) by mouth 2 (two) times daily as needed. (Patient taking differently: Take 25 mg by mouth 2 (two) times daily as needed for anxiety. ) 40 tablet 2  . lacosamide 100 MG TABS Take 1 tablet (100 mg total) by mouth 2 (two) times daily. 60 tablet 0  . meclizine (ANTIVERT) 25 MG tablet Take 25 mg by mouth 3 (three) times daily  as needed for dizziness.     . warfarin (COUMADIN) 5 MG tablet Take as instructed by the Coumadin Clinic. 30 tablet 2  . cyclobenzaprine (FLEXERIL) 10 MG tablet Take 10 mg by mouth 3 (three) times daily.    . famotidine (PEPCID) 20 MG tablet Take 20 mg by mouth 2 (two) times daily.    . methocarbamol (ROBAXIN) 750 MG tablet TAKE 1 TABLET BY MOUTH EVERY 8 HOURS AS NEEDED FOR MUSCLE SPASMS. (Patient not taking: Reported on 09/07/2019) 90 tablet 1   No  facility-administered medications prior to visit.      Review of Systems  Pos in BOLD Constitutional:     weight loss, night sweats,  Fevers, chills, fatigue, lassitude. HEENT:   No headaches,  Difficulty swallowing,  Tooth/dental problems,  Sore throat,                No sneezing, itching, ear ache, nasal congestion, post nasal drip,   CV:  No chest pain,  Orthopnea, PND, swelling in lower extremities, anasarca, dizziness, palpitations  GI  No heartburn, indigestion, abdominal pain, nausea, vomiting, diarrhea, change in bowel habits, loss of appetite  Resp: No shortness of breath with exertion or at rest.  No excess mucus, no productive cough,  No non-productive cough,  No coughing up of blood.  No change in color of mucus.  No wheezing.  No chest wall deformity  Skin: no rash or lesions.  GU: no dysuria, change in color of urine, no urgency or frequency.  No flank pain.  MS:   joint pain or swelling.  No decreased range of motion.   back pain.  Psych:  change in mood or affect.  depression or anxiety.  No memory loss.     Objective:   Physical Exam Vitals:   09/07/19 0945  BP: 139/83  Pulse: 84  Resp: 18  Temp: 98.2 F (36.8 C)  TempSrc: Oral  SpO2: 96%  Weight: 209 lb (94.8 kg)  Height: 5' 11" (1.803 m)    General:  well-nourished, in no distress, depressed  affect  ENT: No lesions,  mouth clear,  oropharynx clear, no postnasal drip  Neck: No JVD, no TMG, no carotid bruits  Lungs: No use of accessory muscles, no dullness to percussion, clear without rales or rhonchi  Cardiovascular: RRR, heart sounds normal, no murmur or gallops, no peripheral edema  Abdomen: soft and NT, no HSM,  BS normal  Musculoskeletal: No deformities, no cyanosis or clubbing  Neuro: alert, non focal  Skin: Warm, no lesions or rashes  CBG 112       Assessment & Plan:  I personally reviewed all images and lab data in the [facility] system as well as any outside material available during  this office visit and agree with the  radiology impressions.   Essential hypertension Essential hypertension with normal blood pressure range at this time therefore will maintain losartan and amlodipine at current dose level  History of stroke without residual deficits History of stroke with residual left-sided weakness  The patient has been nonadherent to warfarin and needs to maintain anticoagulation because of history of atrial arrhythmias and is at high risk for recurrent stroke  I elected to begin Eliquis 5 mg twice daily and sent a prescription to his local pharmacist.  Also will check a CBC again today.  We will discontinue warfarin and have notified our pharmacist here at our clinic of the discontinuance of the warfarin  Syncope History of recurrent syncope this  last episode appear to be more vasovagal related to the patient's urinary retention  Diabetes mellitus type 2 in nonobese [facility]) Type 2 diabetes under reasonable control at this time with adequate glucose levels  No indication for Metformin at this time  Cervical spondylosis with radiculopathy Severe cervical spondylosis with radiculopathy and narrowing of the cervical spinal canal despite surgical intervention  We had a long discussion of this and the patient's may be wanting to be referred to another surgeon and we does not wish to refer to the local surgery group  I recommended to him to consider either [facility] or [facility] for second opinion should repeat imaging suggest this is indicated   CKD (chronic kidney disease), stage III Recent creatinine has normalized therefore chronic kidney disease appears to have resolved we will repeat a metabolic panel today  Alcohol use Ongoing alcohol use with evidence for recurrent withdrawal symptoms  I recommended the patient discontinue Robaxin and hydroxyzine and I did prescribe low-dose Librium 10 mg 3 times daily for a week and reduce to 10 mg twice daily for a  week then 10 mg once daily and discontinue to assist in withdrawing from the alcohol.  Also suggested the patient stop the Antivert as well  Cervical spinal stenosis As per cervical radiculopathy assessment  Chronic anticoagulation Note am switching to apixaban 5 mg twice daily and discontinuing warfarin  History of pulmonary embolus (PE) Note  switching to apixaban to prevent further pulmonary emboli as well as stroke prevention  Seizure [facility]) The patient had run out of Vimpat so we sent a refill to his local pharmacy   Antavion was seen today for hospitalization follow-up.  Diagnoses and all orders for this visit:  Chronic anticoagulation  Prediabetes -     Glucose (CBG) -     Comprehensive metabolic panel -     CBC with Differential/Platelet; Future  Need for hepatitis C screening test -     Hepatitis C antibody -     Comprehensive metabolic panel -     CBC with Differential/Platelet; Future  Cerebrovascular accident (CVA) due to thrombosis of precerebral artery ([place]) -     Comprehensive metabolic panel -     CBC with Differential/Platelet; Future  Seizure ([place]) -     Comprehensive metabolic panel -     CBC with Differential/Platelet; Future  Coronary artery disease involving native coronary artery of native heart with angina pectoris ([place]) -     Comprehensive metabolic panel -     CBC with Differential/Platelet; Future  Essential hypertension -     Comprehensive metabolic panel -     CBC with Differential/Platelet; Future  Depression, unspecified depression type  Cervical spinal stenosis  History of pulmonary embolus (PE)  Chronic pain disorder  Alcohol use  History of stroke without residual deficits  Syncope, unspecified syncope type  Diabetes mellitus type 2 in nonobese (HCC)  Cervical spondylosis with radiculopathy  Stage 3a chronic kidney disease  Other orders -     apixaban (ELIQUIS) 5 MG TABS tablet; Take 1 tablet (5 mg total) by mouth 2  (two) times daily. -     chlordiazePOXIDE (LIBRIUM) 10 MG capsule; Take one capsule three times day for one week, then one capsule twice a day for one week then one capsule daily until supply is out. -     Lacosamide 100 MG TABS; Take 1 tablet (100 mg total) by mouth 2 (two) times daily. -     thiamine (VITAMIN  B-1) 100 MG tablet; Take 1 tablet (100 mg total) by mouth daily.   Note we also will check a hepatitis C per care gap request

## 2019-09-07 ENCOUNTER — Ambulatory Visit: Payer: Medicare HMO | Attending: Critical Care Medicine | Admitting: Critical Care Medicine

## 2019-09-07 ENCOUNTER — Encounter: Payer: Self-pay | Admitting: Critical Care Medicine

## 2019-09-07 ENCOUNTER — Other Ambulatory Visit: Payer: Self-pay

## 2019-09-07 VITALS — BP 139/83 | HR 84 | Temp 98.2°F | Resp 18 | Ht 71.0 in | Wt 209.0 lb

## 2019-09-07 DIAGNOSIS — Z1159 Encounter for screening for other viral diseases: Secondary | ICD-10-CM | POA: Diagnosis not present

## 2019-09-07 DIAGNOSIS — Z86711 Personal history of pulmonary embolism: Secondary | ICD-10-CM

## 2019-09-07 DIAGNOSIS — R55 Syncope and collapse: Secondary | ICD-10-CM | POA: Diagnosis not present

## 2019-09-07 DIAGNOSIS — I25119 Atherosclerotic heart disease of native coronary artery with unspecified angina pectoris: Secondary | ICD-10-CM | POA: Diagnosis not present

## 2019-09-07 DIAGNOSIS — F1090 Alcohol use, unspecified, uncomplicated: Secondary | ICD-10-CM | POA: Insufficient documentation

## 2019-09-07 DIAGNOSIS — M4802 Spinal stenosis, cervical region: Secondary | ICD-10-CM

## 2019-09-07 DIAGNOSIS — N1831 Chronic kidney disease, stage 3a: Secondary | ICD-10-CM

## 2019-09-07 DIAGNOSIS — Z7901 Long term (current) use of anticoagulants: Secondary | ICD-10-CM

## 2019-09-07 DIAGNOSIS — Z8673 Personal history of transient ischemic attack (TIA), and cerebral infarction without residual deficits: Secondary | ICD-10-CM

## 2019-09-07 DIAGNOSIS — M4722 Other spondylosis with radiculopathy, cervical region: Secondary | ICD-10-CM | POA: Diagnosis not present

## 2019-09-07 DIAGNOSIS — I6300 Cerebral infarction due to thrombosis of unspecified precerebral artery: Secondary | ICD-10-CM | POA: Diagnosis not present

## 2019-09-07 DIAGNOSIS — Z789 Other specified health status: Secondary | ICD-10-CM | POA: Insufficient documentation

## 2019-09-07 DIAGNOSIS — F329 Major depressive disorder, single episode, unspecified: Secondary | ICD-10-CM

## 2019-09-07 DIAGNOSIS — I10 Essential (primary) hypertension: Secondary | ICD-10-CM | POA: Diagnosis not present

## 2019-09-07 DIAGNOSIS — Z7289 Other problems related to lifestyle: Secondary | ICD-10-CM | POA: Insufficient documentation

## 2019-09-07 DIAGNOSIS — R7303 Prediabetes: Secondary | ICD-10-CM | POA: Diagnosis not present

## 2019-09-07 DIAGNOSIS — G894 Chronic pain syndrome: Secondary | ICD-10-CM

## 2019-09-07 DIAGNOSIS — R569 Unspecified convulsions: Secondary | ICD-10-CM | POA: Diagnosis not present

## 2019-09-07 DIAGNOSIS — R69 Illness, unspecified: Secondary | ICD-10-CM | POA: Diagnosis not present

## 2019-09-07 DIAGNOSIS — E119 Type 2 diabetes mellitus without complications: Secondary | ICD-10-CM

## 2019-09-07 DIAGNOSIS — F32A Depression, unspecified: Secondary | ICD-10-CM

## 2019-09-07 LAB — GLUCOSE, POCT (MANUAL RESULT ENTRY): POC Glucose: 112 mg/dl — AB (ref 70–99)

## 2019-09-07 MED ORDER — VITAMIN B-1 100 MG PO TABS: 100.0000 mg | ORAL_TABLET | Freq: Every day | ORAL | 2 refills | Status: AC

## 2019-09-07 MED ORDER — APIXABAN 5 MG PO TABS: 5.0000 mg | ORAL_TABLET | Freq: Two times a day (BID) | ORAL | 4 refills | Status: AC

## 2019-09-07 MED ORDER — CHLORDIAZEPOXIDE HCL 10 MG PO CAPS: ORAL_CAPSULE | ORAL | 0 refills | Status: AC

## 2019-09-07 MED ORDER — LACOSAMIDE 100 MG PO TABS: 100.0000 mg | ORAL_TABLET | Freq: Two times a day (BID) | ORAL | 3 refills | Status: AC

## 2019-09-07 NOTE — Patient Instructions (Addendum)
Prevnar vaccine was given  Labs today will include metabolic panel complete blood count and hepatitis C screen  Begin Librium and I sent a 10 mg capsule will take 1 3 times a day for a week then 2-3 times a day for a week then 1 once a day for a week and then stop as you stop alcohol this is to prevent alcohol withdrawal symptoms  Discontinue warfarin and begin Eliquis one of the 5 mg tablets twice daily  Refill on Vimpat also sent into your pharmacy  The Librium Eliquis and Vimpat [facility] all [facility]  Start thiamine 100 mg daily and this was [facility]  Please discontinue the Atarax/Vistaril and the methocarbamol or Robaxin  I will discuss with Dr. Wynetta Emery if you require referral to neurosurgery that the best option would be the [facility]  An appointment with Christa See our social worker will be made to discuss your alcohol  Return to see Dr. Wynetta Emery in 6 weeks

## 2019-09-07 NOTE — Assessment & Plan Note (Signed)
History of recurrent syncope this last episode appear to be more vasovagal related to the patient's urinary retention

## 2019-09-07 NOTE — Assessment & Plan Note (Signed)
As per cervical radiculopathy assessment

## 2019-09-07 NOTE — Assessment & Plan Note (Signed)
Type 2 diabetes under reasonable control at this time with adequate glucose levels  No indication for Metformin at this time

## 2019-09-07 NOTE — Assessment & Plan Note (Signed)
Recent creatinine has normalized therefore chronic kidney disease appears to have resolved we will repeat a metabolic panel today

## 2019-09-07 NOTE — Assessment & Plan Note (Signed)
Severe cervical spondylosis with radiculopathy and narrowing of the cervical spinal canal despite surgical intervention  We had a long discussion of this and the patient's may be wanting to be referred to another surgeon and we does not wish to refer to the local surgery group  I recommended to him to consider either [facility] or [facility] for second opinion should repeat imaging suggest this is indicated

## 2019-09-07 NOTE — Assessment & Plan Note (Signed)
Note  switching to apixaban to prevent further pulmonary emboli as well as stroke prevention

## 2019-09-07 NOTE — Assessment & Plan Note (Signed)
Note am switching to apixaban 5 mg twice daily and discontinuing warfarin

## 2019-09-07 NOTE — Assessment & Plan Note (Signed)
The patient had run out of Vimpat so we sent a refill to his local pharmacy

## 2019-09-07 NOTE — Assessment & Plan Note (Signed)
History of stroke with residual left-sided weakness  The patient has been nonadherent to warfarin and needs to maintain anticoagulation because of history of atrial arrhythmias and is at high risk for recurrent stroke  I elected to begin Eliquis 5 mg twice daily and sent a prescription to his local pharmacist.  Also will check a CBC again today.  We will discontinue warfarin and have notified our pharmacist here at our clinic of the discontinuance of the warfarin

## 2019-09-07 NOTE — Assessment & Plan Note (Signed)
Essential hypertension with normal blood pressure range at this time therefore will maintain losartan and amlodipine at current dose level

## 2019-09-07 NOTE — Assessment & Plan Note (Signed)
Ongoing alcohol use with evidence for recurrent withdrawal symptoms  I recommended the patient discontinue Robaxin and hydroxyzine and I did prescribe low-dose Librium 10 mg 3 times daily for a week and reduce to 10 mg twice daily for a week then 10 mg once daily and discontinue to assist in withdrawing from the alcohol.  Also suggested the patient stop the Antivert as well

## 2019-09-08 LAB — COMPREHENSIVE METABOLIC PANEL
ALT: 14 IU/L (ref 0–44)
AST: 18 IU/L (ref 0–40)
Albumin/Globulin Ratio: 1.8 (ref 1.2–2.2)
Albumin: 4.4 g/dL (ref 3.8–4.8)
Alkaline Phosphatase: 52 IU/L (ref 39–117)
BUN/Creatinine Ratio: 5 — ABNORMAL LOW (ref 10–24)
BUN: 6 mg/dL — ABNORMAL LOW (ref 8–27)
Bilirubin Total: <0.2 mg/dL (ref 0.0–1.2)
CO2: 23 mmol/L (ref 20–29)
Calcium: 9.4 mg/dL (ref 8.6–10.2)
Chloride: 102 mmol/L (ref 96–106)
Creatinine, Ser: 1.27 mg/dL (ref 0.76–1.27)
GFR calc Af Amer: 69 mL/min/{1.73_m2} (ref >59)
GFR calc non Af Amer: 59 mL/min/{1.73_m2} — ABNORMAL LOW (ref >59)
Globulin, Total: 2.4 g/dL (ref 1.5–4.5)
Glucose: 102 mg/dL — ABNORMAL HIGH (ref 65–99)
Potassium: 4.6 mmol/L (ref 3.5–5.2)
Sodium: 139 mmol/L (ref 134–144)
Total Protein: 6.8 g/dL (ref 6.0–8.5)

## 2019-09-08 LAB — HEPATITIS C ANTIBODY: Hep C Virus Ab: <0.1 s/co ratio (ref 0.0–0.9)

## 2019-09-09 MED FILL — METOCLOPRAMIDE 10 MG TABLET: 10 | 3 days supply | Qty: 10 | Fill #3

## 2019-09-10 DIAGNOSIS — H524 Presbyopia: Secondary | ICD-10-CM | POA: Diagnosis not present

## 2019-09-10 DIAGNOSIS — H52223 Regular astigmatism, bilateral: Secondary | ICD-10-CM | POA: Diagnosis not present

## 2019-09-11 ENCOUNTER — Telehealth: Payer: Self-pay | Admitting: *Deleted

## 2019-09-11 NOTE — Telephone Encounter (Signed)
Patient verified DOB ?Patient is aware of labs being normal. ? ?

## 2019-09-11 NOTE — Telephone Encounter (Signed)
-----   Message from Elsie Stain, MD sent at 09/08/2019  5:48 AM EST ----- Let Russell Carter know his CMET is normal.  Liver function normal, kidney function is normal.   HEP C is NEGATIVE

## 2019-09-14 DIAGNOSIS — M25512 Pain in left shoulder: Secondary | ICD-10-CM | POA: Diagnosis not present

## 2019-09-14 DIAGNOSIS — M25511 Pain in right shoulder: Secondary | ICD-10-CM | POA: Diagnosis not present

## 2019-09-21 NOTE — Progress Notes (Deleted)
PATIENT: Russell Carter DOB: May 13, 1956  REASON FOR VISIT: follow up HISTORY FROM: patient  No chief complaint on file.    HISTORY OF PRESENT ILLNESS: Today 09/21/19 Russell Carter is a 63 y.o. male here today for follow up of seizure. He continues Vimpat '100mg'$  twice daily and Depakote '500mg'$  twice daily.   HISTORY: (copied from my note on 06/23/2019)  Russell Carter is a 63 y.o. male here today for follow up for increased seizure activity. He has been seen multiple times in the ER for seizure activity/? syncopal events over the past 6 months. Last seizure 8/20. There was some question about change in AED's. He reports taking Vimpat '100mg'$  twice daily as well as Depakote '500mg'$  twice daily for the past month. Last dose of each was last night. He denies previously missed doses. He did not increase Depakote as recommended by Dr Jannifer Franklin in 02/2019. He does note a headache prior to seizure activity.  He lost his daughter recently due to [place]. He admits to drinking pretty heavily around that time.  He states that he has not had any alcohol since his daughter's funeral 2 weeks ago.  He feels that he has a good support system with his brother, mom and fiance. He denies illicit drug use. He does not drive.   HISTORY: (copied from Dr Jannifer Franklin' note on 03/19/2019)  Russell Carter is a 63 year old right-handed black male with a history of seizures. The patient is on Depakote currently taking 500 mg twice daily. He does have a history of polysubstance abuse as well. He recently was admitted to the hospital on 24 December 2018 with a syncopal event at home, patient claims that he was unconscious for about 15 minutes. He was noted to have left-sided weakness and a left homonymous visual field deficit, he underwent a CT scan and CT angiogram and then was having TPA. The patient eventually had MRI of the brain that did not confirm the presence of a stroke. 2D echocardiogram and EEG study were unremarkable. The patient  claims that he has some residual left-sided vision alteration and some left-sided weakness. He has chronic left arm weakness are related to cervical radiculopathy issues. The patient still has headaches once or twice a week, he will take extra strength Tylenol for this with good improvement. The patient has not had any further seizure type events since last seen in the office about 6 months ago. He is tolerating the Depakote well.   REVIEW OF SYSTEMS: Out of a complete 14 system review of symptoms, the patient complains only of the following symptoms, and all other reviewed systems are negative.  ALLERGIES: Allergies  Allergen Reactions   Shellfish Allergy Anaphylaxis    HOME MEDICATIONS: Outpatient Medications Prior to Visit  Medication Sig Dispense Refill   allopurinol (ZYLOPRIM) 300 MG tablet TAKE 1 TABLET (300 MG TOTAL) BY MOUTH DAILY. 30 tablet 0   amLODipine (NORVASC) 5 MG tablet Take 1 tablet (5 mg total) by mouth daily. 30 tablet 2   apixaban (ELIQUIS) 5 MG TABS tablet Take 1 tablet (5 mg total) by mouth 2 (two) times daily. 60 tablet 4   aspirin EC 81 MG tablet Take 1 tablet (81 mg total) by mouth daily. 30 tablet 2   Calcium Carb-Cholecalciferol (CALCIUM-VITAMIN D) 600-400 MG-UNIT TABS Take 1 tablet by mouth daily. 30 tablet 5   chlordiazePOXIDE (LIBRIUM) 10 MG capsule Take one capsule three times day for one week, then one capsule twice a day for one week then  one capsule daily until supply is out. 90 capsule 0   cyclobenzaprine (FLEXERIL) 10 MG tablet Take 10 mg by mouth 3 (three) times daily.     divalproex (DEPAKOTE) 500 MG DR tablet 1 tablet in the morning, 2 in the evening (Patient taking differently: Take 500-1,000 mg by mouth See admin instructions. Take 2 tablet in the morning and 1 in the evening) 270 tablet 1   DULoxetine (CYMBALTA) 30 MG capsule TAKE 1 CAPSULE (30 MG TOTAL) BY MOUTH 2 (TWO) TIMES DAILY. 60 capsule 0   famotidine (PEPCID) 20 MG tablet Take  20 mg by mouth 2 (two) times daily.     folic acid (FOLVITE) 1 MG tablet Take 1 tablet (1 mg total) by mouth daily. 30 tablet 5   gabapentin (NEURONTIN) 100 MG capsule Take 2 capsules (200 mg total) by mouth 2 (two) times daily. (Patient taking differently: Take 100 mg by mouth 2 (two) times daily. ) 60 capsule 1   Lacosamide 100 MG TABS Take 1 tablet (100 mg total) by mouth 2 (two) times daily. 60 tablet 3   losartan (COZAAR) 100 MG tablet TAKE 1 TABLET (100 MG TOTAL) BY MOUTH DAILY. 30 tablet 0   metoCLOPramide (REGLAN) 10 MG tablet      Multiple Vitamin (MULTIVITAMIN WITH MINERALS) TABS tablet Take 1 tablet by mouth daily. 30 tablet 5   NARCAN 4 MG/0.1ML LIQD nasal spray kit Place 1 spray into the nose once.      nitroGLYCERIN (NITROSTAT) 0.4 MG SL tablet Place 1 tablet (0.4 mg total) under the tongue every 5 (five) minutes as needed for chest pain. 25 tablet 2   oxyCODONE-acetaminophen (PERCOCET) 7.5-325 MG tablet Take 1 tablet by mouth 3 (three) times daily. (Patient taking differently: Take 1 tablet by mouth 5 (five) times daily. ) 20 tablet 0   rosuvastatin (CRESTOR) 40 MG tablet Take 1 tablet (40 mg total) by mouth daily. 30 tablet 6   tamsulosin (FLOMAX) 0.4 MG CAPS capsule TAKE 1 CAPSULE BY MOUTH AT BEDTIME. 30 capsule 0   testosterone cypionate (DEPOTESTOSTERONE CYPIONATE) 200 MG/ML injection Inject 200 mg into the muscle every 30 (thirty) days.     thiamine (VITAMIN B-1) 100 MG tablet Take 1 tablet (100 mg total) by mouth daily. 100 tablet 2   Vitamin D, Ergocalciferol, (DRISDOL) 1.25 MG (50000 UT) CAPS capsule Take 50,000 Units by mouth every Sunday.      No facility-administered medications prior to visit.     PAST MEDICAL HISTORY: Past Medical History:  Diagnosis Date   Acute kidney injury ([place]) 04/27/2018   AKI (acute kidney injury) ([place])    Anginal pain ([place])    Anxiety    Arthritis    Bipolar disorder ([place])    Blood in stool    CAD (coronary artery  disease)    a. 2011: cath showing mod non-obstructive disease b. 02/2016: cath with 95% stenosis in the Mid RCA. DES placed.   Cervical stenosis of spine    Chronic anticoagulation 04/27/2018   Chronic pain disorder 06/05/2017   CKD (chronic kidney disease), stage III 05/06/2018   CKD (chronic kidney disease), stage III 05/06/2018   Cocaine abuse ([place])    history   Complete heart block ([place]) 03/14/2016   Depression    Diabetes mellitus type 2 in nonobese [facility])    Diabetes mellitus without complication ([place])    Dyslipidemia 03/15/2016   Essential hypertension 05/18/2009   Qualifier: Diagnosis of  By: Burnett Kanaris  Gastroesophageal reflux disease without esophagitis 07/31/2017   Gout, unspecified    Headache    History of CVA (cerebrovascular accident)    History of pulmonary embolus (PE) 02/14/2017   HTN (hypertension)    Hypercholesterolemia    Hyperglycemia    Leukopenia 05/06/2018   Migraines    Myocardial infarction [facility]) 2008   drug cocaine and marijuana at time of mi none  since     Normocytic anemia 05/06/2018   Pneumonia    Polysubstance abuse ([place]) 01/15/2013   Rectal bleeding    Renal insufficiency    Seizure ([place]) 05/06/2018   Seizures ([place])    Stroke ([place])    Vertigo     PAST SURGICAL HISTORY: Past Surgical History:  Procedure Laterality Date   ANTERIOR CERVICAL DECOMP/DISCECTOMY FUSION N/A 01/24/2017   Procedure: Cervical Four-Five,Cervical Five Six,Cervical Six-Seven Anterior cervical discectomy with fusion and plate with Cervical Three-Thoracic- One Laminectomy/Foraminotomy with Cervical Three-Thoracic- One Fixation and fusion;  Surgeon: Kevan Ny Ditty, MD;  Location: [place];  Service: Neurosurgery;  Laterality: N/A;  Part-1 anterior approach   CARDIAC CATHETERIZATION  2008; ~ 2011   [place]-40%lad   CARDIAC CATHETERIZATION N/A 03/16/2016   Procedure: Left Heart Cath and Coronary Angiography;  Surgeon: Leonie Man, MD;   Location: [place] CV LAB;  Service: Cardiovascular;  Laterality: N/A;   COLONOSCOPY WITH PROPOFOL N/A 04/17/2018   Procedure: COLONOSCOPY WITH PROPOFOL;  Surgeon: Wonda Horner, MD;  Location: [facility] ENDOSCOPY;  Service: Endoscopy;  Laterality: N/A;   CORONARY ANGIOPLASTY     STENTS   EYE SURGERY Bilateral    "for double vision"   INGUINAL HERNIA REPAIR  05/12/2012   Procedure: HERNIA REPAIR INGUINAL ADULT;  Surgeon: Zenovia Jarred, MD;  Location: [place];  Service: General;  Laterality: Right;   INGUINAL HERNIA REPAIR Left 02/02/2003   Dr. Georganna Skeans. repair with mesh. Same procedure done again in  05/22/2004    INGUINAL HERNIA REPAIR Left ?date 2nd OR   LEFT HEART CATH AND CORONARY ANGIOGRAPHY N/A 04/29/2018   Procedure: LEFT HEART CATH AND CORONARY ANGIOGRAPHY;  Surgeon: Martinique, Peter M, MD;  Location: [place] CV LAB;  Service: Cardiovascular;  Laterality: N/A;   POSTERIOR CERVICAL FUSION/FORAMINOTOMY N/A 01/24/2017   Procedure: Cervical Three-Thoracic One-Laminectomy/Foraminotomy with Cervical Three-Thoracic One Fixation and fusion;  Surgeon: Kevan Ny Ditty, MD;  Location: [place];  Service: Neurosurgery;  Laterality: N/A;  Part-2 Posterior approach   RECONSTRUCT / STABILIZE DISTAL ULNA     SHOULDER OPEN ROTATOR CUFF REPAIR Left 2010   SHOULDER SURGERY Left    "injured on the job; had to cut small bone out"   THUMB FUSION Right     FAMILY HISTORY: Family History  Problem Relation Age of Onset   Heart attack Mother        x7. Still alive   Heart attack Father        deceased b/c of MI   Heart attack Brother        older, deceased b/c of MI    Cancer Brother        bone cancer in 1 brother, ? brain cancer in other brother   Heart attack Brother    Other Brother        MVA   Other Brother    Bone cancer Brother    Cancer Brother    SIDS Sister     SOCIAL HISTORY: Social History   Socioeconomic History   Marital status:  Widowed    Spouse name: Not on file   Number of children: Not on file   Years of education: Not on file   Highest education level: Not on file  Occupational History   Not on file  Social Needs   Financial resource strain: Not on file   Food insecurity    Worry: Not on file    Inability: Not on file   Transportation needs    Medical: Not on file    Non-medical: Not on file  Tobacco Use   Smoking status: Never Smoker   Smokeless tobacco: Never Used  Substance and Sexual Activity   Alcohol use: Not Currently    Comment: 16 oz beer every 2-3 days- states he does not drink   Drug use: Not Currently    Types: "Crack" cocaine, Marijuana, Cocaine    Comment:  "quit 2008"   Sexual activity: Not Currently  Lifestyle   Physical activity    Days per week: Not on file    Minutes per session: Not on file   Stress: Not on file  Relationships   Social connections    Talks on phone: Not on file    Gets together: Not on file    Attends religious service: Not on file    Active member of club or organization: Not on file    Attends meetings of clubs or organizations: Not on file    Relationship status: Not on file   Intimate partner violence    Fear of current or ex partner: Not on file    Emotionally abused: Not on file    Physically abused: Not on file    Forced sexual activity: Not on file  Other Topics Concern   Not on file  Social History Narrative   Lives in [place] with his wife and son.       PHYSICAL EXAM  There were no vitals filed for this visit. There is no height or weight on file to calculate BMI.  Generalized: Well developed, in no acute distress  Cardiology: normal rate and rhythm, no murmur noted Neurological examination  Mentation: Alert oriented to time, place, history taking. Follows all commands speech and language fluent Cranial nerve II-XII: Pupils were equal round reactive to light. Extraocular movements were full, visual field were  full on confrontational test. Facial sensation and strength were normal. Uvula tongue midline. Head turning and shoulder shrug  were normal and symmetric. Motor: The motor testing reveals 5 over 5 strength of all 4 extremities. Good symmetric motor tone is noted throughout.  Sensory: Sensory testing is intact to soft touch on all 4 extremities. No evidence of extinction is noted.  Coordination: Cerebellar testing reveals good finger-nose-finger and heel-to-shin bilaterally.  Gait and station: Gait is normal. Tandem gait is normal. Romberg is negative. No drift is seen.  Reflexes: Deep tendon reflexes are symmetric and normal bilaterally.   DIAGNOSTIC DATA (LABS, IMAGING, TESTING) - I reviewed patient records, labs, notes, testing and imaging myself where available.  No flowsheet data found.   Lab Results  Component Value Date   WBC 4.1 08/21/2019   HGB 14.6 08/21/2019   HCT 46.6 08/21/2019   MCV 89.4 08/21/2019   PLT 232 08/21/2019      Component Value Date/Time   NA 139 09/07/2019 1029   K 4.6 09/07/2019 1029   CL 102 09/07/2019 1029   CO2 23 09/07/2019 1029   GLUCOSE 102 (H) 09/07/2019 1029   GLUCOSE 107 (H) 08/21/2019 0319  BUN 6 (L) 09/07/2019 1029   CREATININE 1.27 09/07/2019 1029   CALCIUM 9.4 09/07/2019 1029   PROT 6.8 09/07/2019 1029   ALBUMIN 4.4 09/07/2019 1029   AST 18 09/07/2019 1029   ALT 14 09/07/2019 1029   ALKPHOS 52 09/07/2019 1029   BILITOT <0.2 09/07/2019 1029   GFRNONAA 59 (L) 09/07/2019 1029   GFRAA 69 09/07/2019 1029   Lab Results  Component Value Date   CHOL 162 12/25/2018   HDL 45 12/25/2018   LDLCALC 47 12/25/2018   TRIG 351 (H) 12/25/2018   CHOLHDL 3.6 12/25/2018   Lab Results  Component Value Date   HGBA1C 6.3 (H) 08/19/2019   Lab Results  Component Value Date   VITAMINB12 162 (L) 08/19/2019   Lab Results  Component Value Date   TSH 0.752 08/19/2019       ASSESSMENT AND PLAN 63 y.o. year old male  has a past medical history  of Acute kidney injury ([place]) (04/27/2018), AKI (acute kidney injury) ([place]), Anginal pain ([place]), Anxiety, Arthritis, Bipolar disorder ([place]), Blood in stool, CAD (coronary artery disease), Cervical stenosis of spine, Chronic anticoagulation (04/27/2018), Chronic pain disorder (06/05/2017), CKD (chronic kidney disease), stage III (05/06/2018), CKD (chronic kidney disease), stage III (05/06/2018), Cocaine abuse ([place]), Complete heart block ([place]) (03/14/2016), Depression, Diabetes mellitus type 2 in nonobese ([place]), Diabetes mellitus without complication ([place]), Dyslipidemia (03/15/2016), Essential hypertension (05/18/2009), Gastroesophageal reflux disease without esophagitis (07/31/2017), Gout, unspecified, Headache, History of CVA (cerebrovascular accident), History of pulmonary embolus (PE) (02/14/2017), HTN (hypertension), Hypercholesterolemia, Hyperglycemia, Leukopenia (05/06/2018), Migraines, Myocardial infarction ([place]) (2008), Normocytic anemia (05/06/2018), Pneumonia, Polysubstance abuse ([place]) (01/15/2013), Rectal bleeding, Renal insufficiency, Seizure ([place]) (05/06/2018), Seizures ([place]), Stroke ([place]), and Vertigo. here with ***    ICD-10-CM   1. Seizure ([place])  R56.9   2. Alcohol abuse  F10.10        No orders of the defined types were placed in this encounter.    No orders of the defined types were placed in this encounter.     I spent 15 minutes with the patient. 50% of this time was spent counseling and educating patient on plan of care and medications.    Debbora Presto, FNP-C 09/21/2019, 9:10 PM [facility] [address] [place], [place] [address] [phone]

## 2019-09-22 ENCOUNTER — Ambulatory Visit: Payer: Medicaid Other | Admitting: Family Medicine

## 2019-09-22 DIAGNOSIS — M545 Low back pain: Secondary | ICD-10-CM | POA: Diagnosis not present

## 2019-09-22 DIAGNOSIS — M542 Cervicalgia: Secondary | ICD-10-CM | POA: Diagnosis not present

## 2019-09-22 DIAGNOSIS — M19012 Primary osteoarthritis, left shoulder: Secondary | ICD-10-CM | POA: Diagnosis not present

## 2019-09-22 DIAGNOSIS — Z79899 Other long term (current) drug therapy: Secondary | ICD-10-CM | POA: Diagnosis not present

## 2019-09-22 DIAGNOSIS — M25511 Pain in right shoulder: Secondary | ICD-10-CM | POA: Diagnosis not present

## 2019-09-22 DIAGNOSIS — M19011 Primary osteoarthritis, right shoulder: Secondary | ICD-10-CM | POA: Diagnosis not present

## 2019-09-25 ENCOUNTER — Emergency Department (HOSPITAL_COMMUNITY): Payer: Medicare HMO

## 2019-09-25 ENCOUNTER — Other Ambulatory Visit: Payer: Self-pay

## 2019-09-25 ENCOUNTER — Inpatient Hospital Stay (HOSPITAL_COMMUNITY)
Admission: EM | Admit: 2019-09-25 | Discharge: 2019-09-29 | DRG: 177 | Disposition: A | Discharge status: Home Health | Payer: Medicare HMO | Attending: Internal Medicine | Admitting: Internal Medicine

## 2019-09-25 ENCOUNTER — Encounter (HOSPITAL_COMMUNITY): Payer: Self-pay

## 2019-09-25 DIAGNOSIS — Z8249 Family history of ischemic heart disease and other diseases of the circulatory system: Secondary | ICD-10-CM

## 2019-09-25 DIAGNOSIS — G40909 Epilepsy, unspecified, not intractable, without status epilepticus: Secondary | ICD-10-CM | POA: Diagnosis present

## 2019-09-25 DIAGNOSIS — Z86711 Personal history of pulmonary embolism: Secondary | ICD-10-CM | POA: Diagnosis not present

## 2019-09-25 DIAGNOSIS — M109 Gout, unspecified: Secondary | ICD-10-CM | POA: Diagnosis present

## 2019-09-25 DIAGNOSIS — Z7982 Long term (current) use of aspirin: Secondary | ICD-10-CM

## 2019-09-25 DIAGNOSIS — Z79891 Long term (current) use of opiate analgesic: Secondary | ICD-10-CM | POA: Diagnosis not present

## 2019-09-25 DIAGNOSIS — R110 Nausea: Secondary | ICD-10-CM | POA: Diagnosis not present

## 2019-09-25 DIAGNOSIS — J9601 Acute respiratory failure with hypoxia: Secondary | ICD-10-CM | POA: Diagnosis present

## 2019-09-25 DIAGNOSIS — F418 Other specified anxiety disorders: Secondary | ICD-10-CM | POA: Diagnosis present

## 2019-09-25 DIAGNOSIS — J1289 Other viral pneumonia: Secondary | ICD-10-CM | POA: Diagnosis not present

## 2019-09-25 DIAGNOSIS — I10 Essential (primary) hypertension: Secondary | ICD-10-CM | POA: Diagnosis present

## 2019-09-25 DIAGNOSIS — F319 Bipolar disorder, unspecified: Secondary | ICD-10-CM | POA: Diagnosis present

## 2019-09-25 DIAGNOSIS — I129 Hypertensive chronic kidney disease with stage 1 through stage 4 chronic kidney disease, or unspecified chronic kidney disease: Secondary | ICD-10-CM | POA: Diagnosis present

## 2019-09-25 DIAGNOSIS — F32A Depression, unspecified: Secondary | ICD-10-CM | POA: Diagnosis present

## 2019-09-25 DIAGNOSIS — F1010 Alcohol abuse, uncomplicated: Secondary | ICD-10-CM | POA: Diagnosis present

## 2019-09-25 DIAGNOSIS — I2510 Atherosclerotic heart disease of native coronary artery without angina pectoris: Secondary | ICD-10-CM | POA: Diagnosis present

## 2019-09-25 DIAGNOSIS — J9811 Atelectasis: Secondary | ICD-10-CM | POA: Diagnosis not present

## 2019-09-25 DIAGNOSIS — F329 Major depressive disorder, single episode, unspecified: Secondary | ICD-10-CM | POA: Diagnosis present

## 2019-09-25 DIAGNOSIS — Z8673 Personal history of transient ischemic attack (TIA), and cerebral infarction without residual deficits: Secondary | ICD-10-CM

## 2019-09-25 DIAGNOSIS — Z789 Other specified health status: Secondary | ICD-10-CM

## 2019-09-25 DIAGNOSIS — E785 Hyperlipidemia, unspecified: Secondary | ICD-10-CM | POA: Diagnosis not present

## 2019-09-25 DIAGNOSIS — I252 Old myocardial infarction: Secondary | ICD-10-CM | POA: Diagnosis not present

## 2019-09-25 DIAGNOSIS — N401 Enlarged prostate with lower urinary tract symptoms: Secondary | ICD-10-CM | POA: Diagnosis present

## 2019-09-25 DIAGNOSIS — R000 Tachycardia, unspecified: Secondary | ICD-10-CM | POA: Diagnosis not present

## 2019-09-25 DIAGNOSIS — E1122 Type 2 diabetes mellitus with diabetic chronic kidney disease: Secondary | ICD-10-CM | POA: Diagnosis not present

## 2019-09-25 DIAGNOSIS — R3916 Straining to void: Secondary | ICD-10-CM | POA: Diagnosis present

## 2019-09-25 DIAGNOSIS — D72810 Lymphocytopenia: Secondary | ICD-10-CM | POA: Diagnosis present

## 2019-09-25 DIAGNOSIS — E119 Type 2 diabetes mellitus without complications: Secondary | ICD-10-CM

## 2019-09-25 DIAGNOSIS — K219 Gastro-esophageal reflux disease without esophagitis: Secondary | ICD-10-CM | POA: Diagnosis present

## 2019-09-25 DIAGNOSIS — J440 Chronic obstructive pulmonary disease with acute lower respiratory infection: Secondary | ICD-10-CM | POA: Diagnosis present

## 2019-09-25 DIAGNOSIS — Z79899 Other long term (current) drug therapy: Secondary | ICD-10-CM

## 2019-09-25 DIAGNOSIS — R531 Weakness: Secondary | ICD-10-CM | POA: Diagnosis not present

## 2019-09-25 DIAGNOSIS — Z7289 Other problems related to lifestyle: Secondary | ICD-10-CM

## 2019-09-25 DIAGNOSIS — E7800 Pure hypercholesterolemia, unspecified: Secondary | ICD-10-CM | POA: Diagnosis not present

## 2019-09-25 DIAGNOSIS — N183 Chronic kidney disease, stage 3 unspecified: Secondary | ICD-10-CM | POA: Diagnosis not present

## 2019-09-25 DIAGNOSIS — U071 COVID-19: Secondary | ICD-10-CM | POA: Diagnosis not present

## 2019-09-25 DIAGNOSIS — R69 Illness, unspecified: Secondary | ICD-10-CM | POA: Diagnosis not present

## 2019-09-25 DIAGNOSIS — R0689 Other abnormalities of breathing: Secondary | ICD-10-CM | POA: Diagnosis not present

## 2019-09-25 DIAGNOSIS — J1282 Pneumonia due to coronavirus disease 2019: Secondary | ICD-10-CM | POA: Diagnosis present

## 2019-09-25 DIAGNOSIS — R52 Pain, unspecified: Secondary | ICD-10-CM | POA: Diagnosis not present

## 2019-09-25 DIAGNOSIS — Z7901 Long term (current) use of anticoagulants: Secondary | ICD-10-CM

## 2019-09-25 DIAGNOSIS — R519 Headache, unspecified: Secondary | ICD-10-CM | POA: Diagnosis not present

## 2019-09-25 DIAGNOSIS — R0602 Shortness of breath: Secondary | ICD-10-CM | POA: Diagnosis not present

## 2019-09-25 DIAGNOSIS — F1910 Other psychoactive substance abuse, uncomplicated: Secondary | ICD-10-CM | POA: Diagnosis present

## 2019-09-25 LAB — CBC WITH DIFFERENTIAL/PLATELET
Abs Immature Granulocytes: 0.03 10*3/uL (ref 0.00–0.07)
Basophils Absolute: 0 10*3/uL (ref 0.0–0.1)
Basophils Relative: 0 %
Eosinophils Absolute: 0 10*3/uL (ref 0.0–0.5)
Eosinophils Relative: 0 %
HCT: 46.6 % (ref 39.0–52.0)
Hemoglobin: 14.7 g/dL (ref 13.0–17.0)
Immature Granulocytes: 1 %
Lymphocytes Relative: 9 %
Lymphs Abs: 0.6 10*3/uL — ABNORMAL LOW (ref 0.7–4.0)
MCH: 28.4 pg (ref 26.0–34.0)
MCHC: 31.5 g/dL (ref 30.0–36.0)
MCV: 90 fL (ref 80.0–100.0)
Monocytes Absolute: 1 10*3/uL (ref 0.1–1.0)
Monocytes Relative: 16 %
Neutro Abs: 4.3 10*3/uL (ref 1.7–7.7)
Neutrophils Relative %: 74 %
Platelets: 152 10*3/uL (ref 150–400)
RBC: 5.18 MIL/uL (ref 4.22–5.81)
RDW: 15.3 % (ref 11.5–15.5)
WBC: 5.8 10*3/uL (ref 4.0–10.5)
nRBC: 0 % (ref 0.0–0.2)

## 2019-09-25 LAB — POCT I-STAT 7, (LYTES, BLD GAS, ICA,H+H)
Acid-base deficit: 1 mmol/L (ref 0.0–2.0)
Bicarbonate: 23.3 mmol/L (ref 20.0–28.0)
Calcium, Ion: 1.22 mmol/L (ref 1.15–1.40)
HCT: 43 % (ref 39.0–52.0)
Hemoglobin: 14.6 g/dL (ref 13.0–17.0)
O2 Saturation: 97 %
Patient temperature: 101.4
Potassium: 3.7 mmol/L (ref 3.5–5.1)
Sodium: 134 mmol/L — ABNORMAL LOW (ref 135–145)
TCO2: 24 mmol/L (ref 22–32)
pCO2 arterial: 39.2 mmHg (ref 32.0–48.0)
pH, Arterial: 7.388 (ref 7.350–7.450)
pO2, Arterial: 102 mmHg (ref 83.0–108.0)

## 2019-09-25 LAB — COMPREHENSIVE METABOLIC PANEL
ALT: 47 U/L — ABNORMAL HIGH (ref 0–44)
AST: 71 U/L — ABNORMAL HIGH (ref 15–41)
Albumin: 3.8 g/dL (ref 3.5–5.0)
Alkaline Phosphatase: 39 U/L (ref 38–126)
Anion gap: 11 (ref 5–15)
BUN: 15 mg/dL (ref 8–23)
CO2: 25 mmol/L (ref 22–32)
Calcium: 8.8 mg/dL — ABNORMAL LOW (ref 8.9–10.3)
Chloride: 100 mmol/L (ref 98–111)
Creatinine, Ser: 1.5 mg/dL — ABNORMAL HIGH (ref 0.61–1.24)
GFR calc Af Amer: 56 mL/min — ABNORMAL LOW (ref >60)
GFR calc non Af Amer: 49 mL/min — ABNORMAL LOW (ref >60)
Glucose, Bld: 101 mg/dL — ABNORMAL HIGH (ref 70–99)
Potassium: 4.1 mmol/L (ref 3.5–5.1)
Sodium: 136 mmol/L (ref 135–145)
Total Bilirubin: 0.8 mg/dL (ref 0.3–1.2)
Total Protein: 6.8 g/dL (ref 6.5–8.1)

## 2019-09-25 LAB — URINALYSIS, ROUTINE W REFLEX MICROSCOPIC
Bilirubin Urine: NEGATIVE
Glucose, UA: NEGATIVE mg/dL
Hgb urine dipstick: NEGATIVE
Ketones, ur: NEGATIVE mg/dL
Leukocytes,Ua: NEGATIVE
Nitrite: NEGATIVE
Protein, ur: NEGATIVE mg/dL
Specific Gravity, Urine: 1.009 (ref 1.005–1.030)
pH: 6 (ref 5.0–8.0)

## 2019-09-25 LAB — POC SARS CORONAVIRUS 2 AG -  ED: SARS Coronavirus 2 Ag: POSITIVE — AB

## 2019-09-25 LAB — BRAIN NATRIURETIC PEPTIDE: B Natriuretic Peptide: 30.5 pg/mL (ref 0.0–100.0)

## 2019-09-25 LAB — D-DIMER, QUANTITATIVE: D-Dimer, Quant: 0.44 ug/mL-FEU (ref 0.00–0.50)

## 2019-09-25 LAB — TRIGLYCERIDES: Triglycerides: 133 mg/dL (ref <150)

## 2019-09-25 LAB — FIBRINOGEN: Fibrinogen: 295 mg/dL (ref 210–475)

## 2019-09-25 LAB — FERRITIN: Ferritin: 26 ng/mL (ref 24–336)

## 2019-09-25 LAB — C-REACTIVE PROTEIN: CRP: 1.7 mg/dL — ABNORMAL HIGH (ref <1.0)

## 2019-09-25 LAB — PROCALCITONIN: Procalcitonin: <0.1 ng/mL

## 2019-09-25 LAB — LACTATE DEHYDROGENASE: LDH: 248 U/L — ABNORMAL HIGH (ref 98–192)

## 2019-09-25 LAB — TROPONIN I (HIGH SENSITIVITY): Troponin I (High Sensitivity): 7 ng/L (ref <18)

## 2019-09-25 MED ORDER — PIPERACILLIN-TAZOBACTAM 3.375 G IVPB 30 MIN
3.3750 g | Freq: Once | INTRAVENOUS | Status: DC
  Filled 2019-09-25: qty 50

## 2019-09-25 MED ORDER — VANCOMYCIN HCL 10 G IV SOLR
2000.0000 mg | Freq: Once | INTRAVENOUS | Status: DC
  Filled 2019-09-25: qty 2000

## 2019-09-25 MED ORDER — SODIUM CHLORIDE 0.9 % IV SOLN
200.0000 mg | Freq: Once | INTRAVENOUS | Status: AC
  Administered 2019-09-26: 200 mg via INTRAVENOUS
  Filled 2019-09-25: qty 40

## 2019-09-25 MED ORDER — SODIUM CHLORIDE 0.9 % IV SOLN
100.0000 mg | Freq: Every day | INTRAVENOUS | Status: DC
  Administered 2019-09-27 – 2019-09-29 (×3): 100 mg via INTRAVENOUS
  Filled 2019-09-25 (×3): qty 20

## 2019-09-25 MED ORDER — APIXABAN 5 MG PO TABS
5.0000 mg | ORAL_TABLET | Freq: Two times a day (BID) | ORAL | Status: DC
  Administered 2019-09-26 – 2019-09-29 (×8): 5 mg via ORAL
  Filled 2019-09-25 (×9): qty 1

## 2019-09-25 MED ORDER — ACETAMINOPHEN 325 MG PO TABS
650.0000 mg | ORAL_TABLET | Freq: Once | ORAL | Status: AC
  Administered 2019-09-25: 650 mg via ORAL
  Filled 2019-09-25: qty 2

## 2019-09-25 MED ORDER — PROCHLORPERAZINE EDISYLATE 10 MG/2ML IJ SOLN
10.0000 mg | Freq: Once | INTRAMUSCULAR | Status: AC
  Administered 2019-09-25: 10 mg via INTRAVENOUS
  Filled 2019-09-25: qty 2

## 2019-09-25 MED ORDER — SODIUM CHLORIDE 0.9 % IV BOLUS
500.0000 mL | Freq: Once | INTRAVENOUS | Status: AC
  Administered 2019-09-25: 500 mL via INTRAVENOUS

## 2019-09-25 MED ORDER — DIPHENHYDRAMINE HCL 50 MG/ML IJ SOLN
12.5000 mg | Freq: Once | INTRAMUSCULAR | Status: AC
  Administered 2019-09-25: 12.5 mg via INTRAVENOUS
  Filled 2019-09-25: qty 1

## 2019-09-25 NOTE — ED Triage Notes (Signed)
[facility] reports pt woke up at 8:30 am with a HA, weakness, cough, body aches and fever, N Pt took 2 7.5 hydrocodone at 8:30 am. Pt 88-91% on RA, placed on 2lpm at 99%. 101.7 fever cbg 92 160/90 rr 26 Hx of HF, COPD, Asthma

## 2019-09-25 NOTE — ED Provider Notes (Signed)
[place] EMERGENCY DEPARTMENT Provider Note   CSN: [id number] Arrival date & time: 09/25/19  1633     History   Chief Complaint Chief Complaint  Patient presents with   Weakness   Cough   Fever   Headache    HPI Russell Carter is a 63 y.o. male with a past medical history significant for CAD, anxiety, bipolar disorder, CKD stage III, depression, atrial arrhythmias, history of cocaine abuse, chronic alcohol use, history of CVA, history of PE on Eliquis, hypertension, diabetes, hyperlipidemia, and seizures who presents to the ED via EMS for an evaluation of shortness of breath, cough, body aches, and fever that started around 8:30 AM this morning.  Patient notes his symptoms have gradually worsened throughout the day.  Patient also notes a severe headache which he states is all throughout his head.  Patient has a history of headaches and states that this is similar to his normal headaches, but slightly worse.  Headache had a gradual onset that has worsened throughout the day.  Patient has taken 2 hydrocodones and Depakote with no relief.  When EMS arrived to patient's residence he was hypoxic at 41 to 91% on room air.  Patient admits to central, nonradiating, sharp chest pain that started this morning.  Patient notes each episode lasts between 7 to 8 minutes with and without exertion.  Patient notes his pain feels similar to his last heart attack. Patient is also experiencing dysuria x 1 week associated with difficulties urinating.  Chart reviewed. Patient had an echocardiogram on 12/25/2018 with EF 60-65% with mild basal hypertrophy and left ventricular diastolic impaired relaxation.   Past Medical History:  Diagnosis Date   Acute kidney injury ([place]) 04/27/2018   AKI (acute kidney injury) ([place])    Anginal pain ([place])    Anxiety    Arthritis    Bipolar disorder ([place])    Blood in stool    CAD (coronary artery disease)    a. 2011: cath showing mod  non-obstructive disease b. 02/2016: cath with 95% stenosis in the Mid RCA. DES placed.   Cervical stenosis of spine    Chronic anticoagulation 04/27/2018   Chronic pain disorder 06/05/2017   CKD (chronic kidney disease), stage III 05/06/2018   CKD (chronic kidney disease), stage III 05/06/2018   Cocaine abuse ([place])    history   Complete heart block ([place]) 03/14/2016   Depression    Diabetes mellitus type 2 in nonobese [facility])    Diabetes mellitus without complication ([place])    Dyslipidemia 03/15/2016   Essential hypertension 05/18/2009   Qualifier: Diagnosis of  By: Burnett Kanaris     Gastroesophageal reflux disease without esophagitis 07/31/2017   Gout, unspecified    Headache    History of CVA (cerebrovascular accident)    History of pulmonary embolus (PE) 02/14/2017   HTN (hypertension)    Hypercholesterolemia    Hyperglycemia    Leukopenia 05/06/2018   Migraines    Myocardial infarction [facility]) 2008   drug cocaine and marijuana at time of mi none  since     Normocytic anemia 05/06/2018   Pneumonia    Polysubstance abuse ([place]) 01/15/2013   Rectal bleeding    Renal insufficiency    Seizure ([place]) 05/06/2018   Seizures ([place])    Stroke [facility])    Vertigo     Patient Active Problem List   Diagnosis Date Noted   Alcohol use 09/07/2019   Benign prostatic hyperplasia (BPH) with straining on urination  History of stroke without residual deficits 12/24/2018   Seizure ([place]) 05/06/2018   Normocytic anemia 05/06/2018   Chronic anticoagulation 04/27/2018   Sensorineural hearing loss (SNHL), bilateral 04/15/2018   Left knee pain 01/10/2018   Syncope 09/04/2017   Numbness and tingling in both hands 07/31/2017   Gastroesophageal reflux disease without esophagitis 07/31/2017   Chronic pain disorder 06/05/2017   History of pulmonary embolus (PE) 02/14/2017   Diabetes mellitus type 2 in nonobese [facility])    Cocaine abuse ([place])    Depression     History of CVA (cerebrovascular accident)    Cervical spondylosis with radiculopathy 01/24/2017   Dyslipidemia 03/15/2016   Cervical spinal stenosis 03/14/2016   CAD-50% LAD 2011 12/14/2014   Gout 11/16/2014   Polysubstance abuse ([place]) 01/15/2013   DIZZINESS 06/05/2010   Essential hypertension 05/18/2009    Past Surgical History:  Procedure Laterality Date   ANTERIOR CERVICAL DECOMP/DISCECTOMY FUSION N/A 01/24/2017   Procedure: Cervical Four-Five,Cervical Five Six,Cervical Six-Seven Anterior cervical discectomy with fusion and plate with Cervical Three-Thoracic- One Laminectomy/Foraminotomy with Cervical Three-Thoracic- One Fixation and fusion;  Surgeon: Kevan Ny Ditty, MD;  Location: [place];  Service: Neurosurgery;  Laterality: N/A;  Part-1 anterior approach   CARDIAC CATHETERIZATION  2008; ~ 2011   [facility]-40%lad   CARDIAC CATHETERIZATION N/A 03/16/2016   Procedure: Left Heart Cath and Coronary Angiography;  Surgeon: Leonie Man, MD;  Location: [place] CV LAB;  Service: Cardiovascular;  Laterality: N/A;   COLONOSCOPY WITH PROPOFOL N/A 04/17/2018   Procedure: COLONOSCOPY WITH PROPOFOL;  Surgeon: Wonda Horner, MD;  Location: [facility] ENDOSCOPY;  Service: Endoscopy;  Laterality: N/A;   CORONARY ANGIOPLASTY     STENTS   EYE SURGERY Bilateral    "for double vision"   INGUINAL HERNIA REPAIR  05/12/2012   Procedure: HERNIA REPAIR INGUINAL ADULT;  Surgeon: Zenovia Jarred, MD;  Location: [place];  Service: General;  Laterality: Right;   INGUINAL HERNIA REPAIR Left 02/02/2003   Dr. Georganna Skeans. repair with mesh. Same procedure done again in  05/22/2004    INGUINAL HERNIA REPAIR Left ?date 2nd OR   LEFT HEART CATH AND CORONARY ANGIOGRAPHY N/A 04/29/2018   Procedure: LEFT HEART CATH AND CORONARY ANGIOGRAPHY;  Surgeon: Martinique, Peter M, MD;  Location: [place] CV LAB;  Service: Cardiovascular;  Laterality: N/A;   POSTERIOR CERVICAL FUSION/FORAMINOTOMY  N/A 01/24/2017   Procedure: Cervical Three-Thoracic One-Laminectomy/Foraminotomy with Cervical Three-Thoracic One Fixation and fusion;  Surgeon: Kevan Ny Ditty, MD;  Location: [place];  Service: Neurosurgery;  Laterality: N/A;  Part-2 Posterior approach   RECONSTRUCT / STABILIZE DISTAL ULNA     SHOULDER OPEN ROTATOR CUFF REPAIR Left 2010   SHOULDER SURGERY Left    "injured on the job; had to cut small bone out"   THUMB FUSION Right         Home Medications    Prior to Admission medications   Medication Sig Start Date End Date Taking? Authorizing Provider  allopurinol (ZYLOPRIM) 300 MG tablet TAKE 1 TABLET (300 MG TOTAL) BY MOUTH DAILY. 08/28/19   Ladell Pier, MD  amLODipine (NORVASC) 5 MG tablet Take 1 tablet (5 mg total) by mouth daily. 12/18/18   Ladell Pier, MD  apixaban (ELIQUIS) 5 MG TABS tablet Take 1 tablet (5 mg total) by mouth 2 (two) times daily. 09/07/19   Elsie Stain, MD  aspirin EC 81 MG tablet Take 1 tablet (81 mg total) by mouth daily. 12/26/18 12/26/19  Jimmye Norman,  Nita Sells, NP  Calcium Carb-Cholecalciferol (CALCIUM-VITAMIN D) 600-400 MG-UNIT TABS Take 1 tablet by mouth daily. 05/07/18   Roxan Hockey, MD  chlordiazePOXIDE (LIBRIUM) 10 MG capsule Take one capsule three times day for one week, then one capsule twice a day for one week then one capsule daily until supply is out. 09/07/19   Elsie Stain, MD  cyclobenzaprine (FLEXERIL) 10 MG tablet Take 10 mg by mouth 3 (three) times daily. 08/26/19   [provider]  divalproex (DEPAKOTE) 500 MG DR tablet 1 tablet in the morning, 2 in the evening Patient taking differently: Take 500-1,000 mg by mouth See admin instructions. Take 2 tablet in the morning and 1 in the evening 06/02/19   Kathrynn Ducking, MD  DULoxetine (CYMBALTA) 30 MG capsule TAKE 1 CAPSULE (30 MG TOTAL) BY MOUTH 2 (TWO) TIMES DAILY. 08/28/19   Ladell Pier, MD  famotidine (PEPCID) 20 MG tablet Take 20 mg by mouth 2 (two)  times daily. 08/26/19   [provider]  folic acid (FOLVITE) 1 MG tablet Take 1 tablet (1 mg total) by mouth daily. 05/07/18   Roxan Hockey, MD  gabapentin (NEURONTIN) 100 MG capsule Take 2 capsules (200 mg total) by mouth 2 (two) times daily. Patient taking differently: Take 100 mg by mouth 2 (two) times daily.  05/15/19 04/04/20  Domenic Polite, MD  Lacosamide 100 MG TABS Take 1 tablet (100 mg total) by mouth 2 (two) times daily. 09/07/19 10/07/19  Elsie Stain, MD  losartan (COZAAR) 100 MG tablet TAKE 1 TABLET (100 MG TOTAL) BY MOUTH DAILY. 08/28/19   Ladell Pier, MD  metoCLOPramide (REGLAN) 10 MG tablet  08/28/19   [provider]  Multiple Vitamin (MULTIVITAMIN WITH MINERALS) TABS tablet Take 1 tablet by mouth daily. 05/07/18   Roxan Hockey, MD  NARCAN 4 MG/0.1ML LIQD nasal spray kit Place 1 spray into the nose once.  02/12/19   [provider]  nitroGLYCERIN (NITROSTAT) 0.4 MG SL tablet Place 1 tablet (0.4 mg total) under the tongue every 5 (five) minutes as needed for chest pain. 02/20/19   Ladell Pier, MD  oxyCODONE-acetaminophen (PERCOCET) 7.5-325 MG tablet Take 1 tablet by mouth 3 (three) times daily. Patient taking differently: Take 1 tablet by mouth 5 (five) times daily.  12/26/18 12/26/19  Vonzella Nipple, NP  rosuvastatin (CRESTOR) 40 MG tablet Take 1 tablet (40 mg total) by mouth daily. 04/21/19   Ladell Pier, MD  tamsulosin (FLOMAX) 0.4 MG CAPS capsule TAKE 1 CAPSULE BY MOUTH AT BEDTIME. 08/28/19   Ladell Pier, MD  testosterone cypionate (DEPOTESTOSTERONE CYPIONATE) 200 MG/ML injection Inject 200 mg into the muscle every 30 (thirty) days. 08/23/19   [provider]  thiamine (VITAMIN B-1) 100 MG tablet Take 1 tablet (100 mg total) by mouth daily. 09/07/19   Elsie Stain, MD  Vitamin D, Ergocalciferol, (DRISDOL) 1.25 MG (50000 UT) CAPS capsule Take 50,000 Units by mouth every Sunday.     [provider]     Family History Family History  Problem Relation Age of Onset   Heart attack Mother        x7. Still alive   Heart attack Father        deceased b/c of MI   Heart attack Brother        older, deceased b/c of MI    Cancer Brother        bone cancer in 1 brother, ? brain cancer in  other brother   Heart attack Brother    Other Brother        MVA   Other Brother    Bone cancer Brother    Cancer Brother    SIDS Sister     Social History Social History   Tobacco Use   Smoking status: Never Smoker   Smokeless tobacco: Never Used  Substance Use Topics   Alcohol use: Not Currently    Comment: 16 oz beer every 2-3 days- states he does not drink   Drug use: Not Currently    Types: "Crack" cocaine, Marijuana, Cocaine    Comment:  "quit 2008"     Allergies   Shellfish allergy   Review of Systems Review of Systems  Constitutional: Positive for chills and fever.  HENT: Negative for trouble swallowing.   Respiratory: Positive for cough and shortness of breath.   Cardiovascular: Positive for chest pain. Negative for leg swelling.  Gastrointestinal: Positive for abdominal pain. Negative for diarrhea, nausea and vomiting.  Genitourinary: Positive for difficulty urinating and dysuria. Negative for hematuria.  Musculoskeletal: Positive for myalgias.  Skin: Negative for color change.  Neurological: Positive for weakness and headaches. Negative for syncope, facial asymmetry and speech difficulty.     Physical Exam Updated Vital Signs BP (!) 160/80    Pulse 96    Temp (!) 101.4 F (38.6 C) (Oral)    Resp (!) 27    SpO2 96%   Physical Exam Vitals signs and nursing note reviewed.  Constitutional:      General: He is not in acute distress.    Appearance: He is ill-appearing.  HENT:     Head: Normocephalic.  Eyes:     General: Scleral icterus present.     Pupils: Pupils are equal, round, and reactive to light.     Comments: Unable to cross left midline with  EOMs.   Neck:     Musculoskeletal: Normal range of motion and neck supple. No neck rigidity or muscular tenderness.     Comments: No meningismus Cardiovascular:     Rate and Rhythm: Normal rate and regular rhythm.     Pulses: Normal pulses.     Heart sounds: Normal heart sounds. No murmur. No friction rub. No gallop.   Pulmonary:     Breath sounds: Normal breath sounds.     Comments: Patient on 2L nasal cannula. Tachypneic at 26. Speaking in broken up sentences.  Abdominal:     General: Abdomen is flat. Bowel sounds are normal. There is distension.     Palpations: Abdomen is soft.     Tenderness: There is abdominal tenderness. There is no guarding or rebound.     Comments: Diffuse tenderness throughout. Abdomen appears to be slightly distended  Musculoskeletal:     Comments: Able to move all 4 extremities without difficulty  Skin:    General: Skin is warm and dry.  Neurological:     General: No focal deficit present.     Mental Status: He is alert.     Comments: Speech is clear, able to follow commands CN III-XII intact Normal strength in upper and lower extremities bilaterally including dorsiflexion and plantar flexion, strong and equal grip strength Sensation grossly intact throughout Moves extremities without ataxia, coordination intact No pronator drift       ED Treatments / Results  Labs (all labs ordered are listed, but only abnormal results are displayed) Labs Reviewed  CBC WITH DIFFERENTIAL/PLATELET - Abnormal; Notable for the following components:  Result Value   Lymphs Abs 0.6 (*)    All other components within normal limits  COMPREHENSIVE METABOLIC PANEL - Abnormal; Notable for the following components:   Glucose, Bld 101 (*)    Creatinine, Ser 1.50 (*)    Calcium 8.8 (*)    AST 71 (*)    ALT 47 (*)    GFR calc non Af Amer 49 (*)    GFR calc Af Amer 56 (*)    All other components within normal limits  LACTATE DEHYDROGENASE - Abnormal; Notable for the  following components:   LDH 248 (*)    All other components within normal limits  C-REACTIVE PROTEIN - Abnormal; Notable for the following components:   CRP 1.7 (*)    All other components within normal limits  URINALYSIS, ROUTINE W REFLEX MICROSCOPIC - Abnormal; Notable for the following components:   Color, Urine STRAW (*)    All other components within normal limits  POC SARS CORONAVIRUS 2 AG -  ED - Abnormal; Notable for the following components:   SARS Coronavirus 2 Ag POSITIVE (*)    All other components within normal limits  POCT I-STAT 7, (LYTES, BLD GAS, ICA,H+H) - Abnormal; Notable for the following components:   Sodium 134 (*)    All other components within normal limits  CULTURE, BLOOD (ROUTINE X 2)  CULTURE, BLOOD (ROUTINE X 2)  URINE CULTURE  SARS CORONAVIRUS 2 (TAT 6-24 HRS)  D-DIMER, QUANTITATIVE (NOT AT [facility])  FERRITIN  TRIGLYCERIDES  FIBRINOGEN  BRAIN NATRIURETIC PEPTIDE  LACTIC ACID, PLASMA  LACTIC ACID, PLASMA  PROCALCITONIN  CBG MONITORING, ED  I-STAT ARTERIAL BLOOD GAS, ED  TROPONIN I (HIGH SENSITIVITY)  TROPONIN I (HIGH SENSITIVITY)    EKG None  Radiology Ct Head Wo Contrast  Result Date: 09/25/2019 CLINICAL DATA:  Worst headache of life. EXAM: CT HEAD WITHOUT CONTRAST TECHNIQUE: Contiguous axial images were obtained from the base of the skull through the vertex without intravenous contrast. COMPARISON:  August 19, 2019 FINDINGS: Brain: No subdural, epidural, or subarachnoid hemorrhage. Ventricles and sulci are stable. No acute cortical ischemia or infarct. No mass effect or midline shift. Cerebellum, brainstem, and basal cisterns are normal. Vascular: No hyperdense vessel or unexpected calcification. Skull: Normal. Negative for fracture or focal lesion. Sinuses/Orbits: No acute finding. Other: None. IMPRESSION: No cause for headache identified. No acute intracranial abnormalities. Electronically Signed   By: Dorise Bullion III M.D   On: 09/25/2019  19:39   Dg Chest Port 1 View  Result Date: 09/25/2019 CLINICAL DATA:  Shortness of breath, headache, weakness, cough, body aches, fever, history CHF, COPD, asthma EXAM: PORTABLE CHEST 1 VIEW COMPARISON:  Portable exam 1730 hours compared to 08/19/2019 FINDINGS: Enlargement of cardiac silhouette. Mediastinal contours and pulmonary vascularity normal. Elevation of LEFT diaphragm. Bibasilar atelectasis. No definite infiltrate, pleural effusion or pneumothorax. Gaseous distention of stomach. Prior cervical fusion. IMPRESSION: Low lung volumes with bibasilar atelectasis. Gaseous distention of stomach. Electronically Signed   By: Lavonia Dana M.D.   On: 09/25/2019 18:23    Procedures Procedures (including critical care time)  Medications Ordered in ED Medications  acetaminophen (TYLENOL) tablet 650 mg (has no administration in time range)  sodium chloride 0.9 % bolus 500 mL (has no administration in time range)  prochlorperazine (COMPAZINE) injection 10 mg (has no administration in time range)  diphenhydrAMINE (BENADRYL) injection 12.5 mg (has no administration in time range)     Initial Impression / Assessment and Plan / ED Course  I  have reviewed the triage vital signs and the nursing notes.  Pertinent labs & imaging results that were available during my care of the patient were reviewed by me and considered in my medical decision making (see chart for details).  Clinical Course as of Sep 24 2232  Fri Sep 25, 2019  2033 Patient has been a hard stick. RN attempting right now   [CC]  2052 Re-evaluated patient at bedside. RN there now drawing blood. Patient able to open eyes to voice, but appears to be a little less responsive compared to initial exam.   [CC]  2100 Spoke to patient's son on the phone to give update. Told son patient tested positive for COVID, but we are still waiting on his other labs. Instructed son to call others that were around patient to self-quarantine since they have  been exposed to [place]   [CC]  2133 Patient re-evaluated with Dr. Wyvonnia Dusky at bedside. He is able to answer questions. Another RN at bedside attempting to draw more blood.   [CC]  2226 Patient was tachypneic around 25-30. Increased oxygen to 3L. RR 17-21.    [CC]    Clinical Course User Index [CC] Jonette Eva, PA-C      63 year old male presents to the ED via EMS. due to sudden onset of shortness of breath, cough, fever, and headache that started this morning around 8:30 AM. EMS found patient to be hypoxic at 88% on room air and started 2L nasal cannula. Upon arrival, patient is febrile at 101.4, HR 100, O2 saturation at 98% on 2L. Patient uncooperative during initial exam. Lungs clear to auscultation bilaterally. Abdomen appears to be slightly distended with diffuse tenderness. Normal neurological exam, except for slight EOM defect on left side. Assume symptoms are related to COVID infection, but will give 1 dose of vancomycin and zosyn to cover for potential bacterial infections especially since patient complains of dysuria. Will also give 578m bolus. Will order rapid COVID with admission labs given patient will most likely need to be admitted for further evaluation. Will order CT scan given patient is on Eliquis with a severe headache to rule out acute intracranial abnormalities. Troponin/EKG to rule out ACS. Discussed case with Dr. RWyvonnia Duskywho evaluated patient at bedside and agrees with assessment and plan.   CXR personally reviewed demonstrates low lung volumes and bibasilar atelectasis.  CT head personally reviewed which is negative for any acute intracranial abnormalities.  CBC reassuring with no leukocytosis.  UA negative for signs of infection.  Rapid Covid test positive suspect most of his symptoms are related to Covid infection.  Given patient has a new need for oxygen will need to be admitted for further treatment.  Will wait for further labs to come back before consult made.  ABG  reassuring with no acidosis or alkalosis, normal pCO2 and pO2. CMP significant for AKI with creatinine at 1.5 and increase in AST and ALT likely due to COVID infection. D-dimer normal. Fibrogen normal. TG normal. LDH elevated at 248. C-reactive protein elevated at 1.7. Normal ferritin. Normal BNP, doubt CHF exacerbation given patient does not appear to be volume overloaded. Initial troponin normal at 7.    Patient [facility] PA[facility] who will follow-up on pending labs, reasses patient, and admit patient for further treatment.    MJerrenMinor was evaluated in Emergency Department on 09/25/2019 for the symptoms described in the history of present illness. He was evaluated in the context of the global COVID-19 pandemic, which  necessitated consideration that the patient might be at risk for infection with the SARS-CoV-2 virus that causes COVID-19. Institutional protocols and algorithms that pertain to the evaluation of patients at risk for COVID-19 are in a state of rapid change based on information released by regulatory bodies including the CDC and federal and state organizations. These policies and algorithms were followed during the patient's care in the ED.  Final Clinical Impressions(s) / ED Diagnoses   Final diagnoses:  COVID-19 virus infection    ED Discharge Orders    None       Romie Levee 09/25/19 2234    Ezequiel Essex, MD 09/26/19 [phone]

## 2019-09-25 NOTE — ED Notes (Signed)
Two unsuccessful iv attempts

## 2019-09-25 NOTE — ED Notes (Signed)
Son- stacey Gamblin Brooke Bonito would like an update at [phone]

## 2019-09-25 NOTE — ED Provider Notes (Signed)
63 year old male received at signout from PA Cheek pending troponin and likely admission. Per her HPI:   "Russell Carter is a 63 y.o. male with a past medical history significant for CAD, anxiety, bipolar disorder, CKD stage III, depression, atrial arrhythmias, history of cocaine abuse, chronic alcohol use, history of CVA, history of PE on Eliquis, hypertension, diabetes, hyperlipidemia, and seizures who presents to the ED via EMS for an evaluation of shortness of breath, cough, body aches, and fever that started around 8:30 AM this morning.  Patient notes his symptoms have gradually worsened throughout the day.  Patient also notes a severe headache which he states is all throughout his head.  Patient has a history of headaches and states that this is similar to his normal headaches, but slightly worse.  Headache had a gradual onset that has worsened throughout the day.  Patient has taken 2 hydrocodones and Depakote with no relief.  When EMS arrived to patient's residence he was hypoxic at 88 to 91% on room air.  Patient admits to central, nonradiating, sharp chest pain that started this morning.  Patient notes each episode lasts between 7 to 8 minutes with and without exertion.  Patient notes his pain feels similar to his last heart attack. Patient is also experiencing dysuria x 1 week associated with difficulties urinating.  Chart reviewed. Patient had an echocardiogram on 12/25/2018 with EF 60-65% with mild basal hypertrophy and left ventricular diastolic impaired relaxation."    Physical Exam  BP (!) 160/91   Pulse 87   Temp (!) 101.4 F (38.6 C) (Oral)   Resp (!) 33   Ht 5\' 11"  (1.803 m)   Wt 95.3 kg   SpO2 96%   BMI 29.29 kg/m   Physical Exam Vitals signs and nursing note reviewed.  Constitutional:      General: He is not in acute distress.    Appearance: He is well-developed. He is not toxic-appearing.     Comments: [place] in place on 3L.   HENT:     Head: Normocephalic.  Eyes:      Conjunctiva/sclera: Conjunctivae normal.  Neck:     Musculoskeletal: Neck supple.  Cardiovascular:     Rate and Rhythm: Normal rate and regular rhythm.     Pulses: Normal pulses.     Heart sounds: No murmur. No friction rub. No gallop.   Pulmonary:     Effort: Pulmonary effort is normal.     Comments: Tachypneic.  No respiratory distress or retractions. Skin:    General: Skin is warm and dry.  Neurological:     Mental Status: He is alert.  Psychiatric:        Behavior: Behavior normal.     ED Course/Procedures   Clinical Course as of Sep 24 2338  Fri Sep 25, 2019  2033 Patient has been a hard stick. RN attempting right now   [CC]  2052 Re-evaluated patient at bedside. RN there now drawing blood. Patient able to open eyes to voice, but appears to be a little less responsive compared to initial exam.   [CC]  2100 Spoke to patient's son on the phone to give update. Told son patient tested positive for COVID, but we are still waiting on his other labs. Instructed son to call others that were around patient to self-quarantine since they have been exposed to COVID   [CC]  2133 Patient re-evaluated with Dr. 2134 at bedside. He is able to answer questions. Another RN at bedside attempting to draw more  blood.   [CC]  2226 Patient was tachypneic around 25-30. Increased oxygen to 3L. RR 17-21.    [CC]    Clinical Course User Index [CC] Romie Levee    Procedures  MDM   63 year old male received at signout from PA cheek pending troponin and likely admission.  Please see her note for further work-up and medical decision making.,  In brief, the patient called EMS after developing shortness of breath, cough, headache, body aches, dysuria, and fever this morning.  He was found to be hypoxic at 88% on room air and was placed on 2 L.  Prior to my evaluation of the patient, Tylenol has been ordered.  Vancomycin and Zosyn had been empirically ordered due to fever in the setting of  urinary complaints.  Unfortunately, these antibiotics were not given prior to his urinalysis returning. Urine did not appear infectious.  Chest x-ray with low lung volumes with bibasilar atelectasis, likely secondary to COVID-19 as COVID-19 test was positive today in the ER.  Therefore, antibiotics were canceled prior to administration since there did not appear to be a bacterial source of infection.   The patient was febrile to 101.4 on arrival.  Tylenol was ordered several hours prior to my assessment, had not yet been administered at the time of my evaluation.  CT head was obtained since the patient was anticoagulated and was complaining of a headache, but was unremarkable.  Labs are otherwise reassuring.  On exam, patient continues to be tachypneic with respiratory rate in the high 20s.  Nasal cannula was increased to 3 L and tachypnea resolved.  He is satting at 95% on room air.  Troponin trend is flat and his EKG does not demonstrate evidence of ischemia.  The patient was previously seen and independently evaluated by Dr. Wyvonnia Dusky, attending physician.   D-dimer was normal.  He has mild elevation of his transaminases and his elevation of LDH, likely secondary to COVID-19.  Consulted unassigned medicine for admission and Dr. Jeannie Fend with the hospitalist team has accepted the patient for admission. The patient appears reasonably stabilized for admission considering the current resources, flow, and capabilities available in the ED at this time, and I doubt any other [facility] requiring further screening and/or treatment in the ED prior to admission.     Joanne Gavel, PA-C 09/25/19 2339    Ezequiel Essex, MD 09/26/19 [phone]

## 2019-09-25 NOTE — H&P (Signed)
History and Physical   Russell Carter IPJ:[id number] DOB: October 08, 1956 DOA: 09/25/2019  Referring MD/NP/PA: Dr Wyvonnia Dusky  PCP: Ladell Pier, MD   Outpatient Specialists: None   Patient coming from: Home  Chief Complaint: Fever cough and weakness  HPI: Russell Carter is a 63 y.o. male with medical history significant of coronary artery disease, osteoarthritis, polysubstance abuse, chronic anticoagulation, diabetes, cocaine abuse, hyperlipidemia, cervical stenosis, anxiety disorder hypertension, chronic kidney disease, seizure disorder, history of CVA, bipolar disorder and multiple other medical problems who presented to the ER today with shortness of breath cough and fever that started today.  Symptoms have gotten worse throughout the day.  He has had some severe headache associated with it.  He has had chronic headaches but this is worse.  Patient took some hydrocodone as his Depakote but no relief.  EMS was called and patient was found to be hypoxic with oxygen sats 88% on room air.  Brought to the ER where he was further evaluated and oxygen sats remained in the 80s but currently on 3 L with sats 95%.  Patient had symptoms signs and x-ray findings consistent with Covid pneumonia.  His Covid screen is positive for COVID-19.  He is being admitted with acute respiratory failure secondary to COVID-19 pneumonia..  ED Course: Temperature is 101.4 blood pressure 153/86 pulse 100 respirate of 33 and oxygen sats 95% on 3 L.  White count 5.8 hemoglobin 14.6 platelets 152.  Sodium is 134 potassium 3.7 chloride 100 CO2 25 BUN 15 creatinine 1.50 and calcium 8.8 procalcitonin less than 0.10.  Patient 7.38 PCO2 39 PO2 102 on 3 L.  LDH is 248.  Triglyceride 133.  Urinalysis is negative.  COVID-19 is positive.  Head CT without contrast negative.  CT without contrast is negative.  Chest x-ray showed bibasilar infiltrates versus atelectasis.  Patient is being admitted for treatment of COVID-19 pneumonia.  Review of  Systems: As per HPI otherwise 10 point review of systems negative.    Past Medical History:  Diagnosis Date  . Acute kidney injury ([place]) 04/27/2018  . AKI (acute kidney injury) ([place])   . Anginal pain ([place])   . Anxiety   . Arthritis   . Bipolar disorder ([place])   . Blood in stool   . CAD (coronary artery disease)    a. 2011: cath showing mod non-obstructive disease b. 02/2016: cath with 95% stenosis in the Mid RCA. DES placed.  . Cervical stenosis of spine   . Chronic anticoagulation 04/27/2018  . Chronic pain disorder 06/05/2017  . CKD (chronic kidney disease), stage III 05/06/2018  . CKD (chronic kidney disease), stage III 05/06/2018  . Cocaine abuse ([place])    history  . Complete heart block ([place]) 03/14/2016  . Depression   . Diabetes mellitus type 2 in nonobese (HCC)   . Diabetes mellitus without complication ([place])   . Dyslipidemia 03/15/2016  . Essential hypertension 05/18/2009   Qualifier: Diagnosis of  By: Burnett Kanaris    . Gastroesophageal reflux disease without esophagitis 07/31/2017  . Gout, unspecified   . Headache   . History of CVA (cerebrovascular accident)   . History of pulmonary embolus (PE) 02/14/2017  . HTN (hypertension)   . Hypercholesterolemia   . Hyperglycemia   . Leukopenia 05/06/2018  . Migraines   . Myocardial infarction [facility]) 2008   drug cocaine and marijuana at time of mi none  since    . Normocytic anemia 05/06/2018  . Pneumonia   . Polysubstance abuse ([place]) 01/15/2013  .  Rectal bleeding   . Renal insufficiency   . Seizure ([place]) 05/06/2018  . Seizures ([place])   . Stroke ([place])   . Vertigo     Past Surgical History:  Procedure Laterality Date  . ANTERIOR CERVICAL DECOMP/DISCECTOMY FUSION N/A 01/24/2017   Procedure: Cervical Four-Five,Cervical Five Six,Cervical Six-Seven Anterior cervical discectomy with fusion and plate with Cervical Three-Thoracic- One Laminectomy/Foraminotomy with Cervical Three-Thoracic- One Fixation and fusion;  Surgeon: Kevan Ny Ditty, MD;  Location: [place];  Service: Neurosurgery;  Laterality: N/A;  Part-1 anterior approach  . CARDIAC CATHETERIZATION  2008; ~ 2011   [place]-40%lad  . CARDIAC CATHETERIZATION N/A 03/16/2016   Procedure: Left Heart Cath and Coronary Angiography;  Surgeon: Leonie Man, MD;  Location: [place] CV LAB;  Service: Cardiovascular;  Laterality: N/A;  . COLONOSCOPY WITH PROPOFOL N/A 04/17/2018   Procedure: COLONOSCOPY WITH PROPOFOL;  Surgeon: Wonda Horner, MD;  Location: [facility] ENDOSCOPY;  Service: Endoscopy;  Laterality: N/A;  . CORONARY ANGIOPLASTY     STENTS  . EYE SURGERY Bilateral    "for double vision"  . INGUINAL HERNIA REPAIR  05/12/2012   Procedure: HERNIA REPAIR INGUINAL ADULT;  Surgeon: Zenovia Jarred, MD;  Location: [place];  Service: General;  Laterality: Right;  . INGUINAL HERNIA REPAIR Left 02/02/2003   Dr. Georganna Skeans. repair with mesh. Same procedure done again in  05/22/2004   . INGUINAL HERNIA REPAIR Left ?date 2nd OR  . LEFT HEART CATH AND CORONARY ANGIOGRAPHY N/A 04/29/2018   Procedure: LEFT HEART CATH AND CORONARY ANGIOGRAPHY;  Surgeon: Martinique, Peter M, MD;  Location: [place] CV LAB;  Service: Cardiovascular;  Laterality: N/A;  . POSTERIOR CERVICAL FUSION/FORAMINOTOMY N/A 01/24/2017   Procedure: Cervical Three-Thoracic One-Laminectomy/Foraminotomy with Cervical Three-Thoracic One Fixation and fusion;  Surgeon: Kevan Ny Ditty, MD;  Location: [place];  Service: Neurosurgery;  Laterality: N/A;  Part-2 Posterior approach  . RECONSTRUCT / STABILIZE DISTAL ULNA    . SHOULDER OPEN ROTATOR CUFF REPAIR Left 2010  . SHOULDER SURGERY Left    "injured on the job; had to cut small bone out"  . THUMB FUSION Right      reports that he has never smoked. He has never used smokeless tobacco. He reports previous alcohol use. He reports previous drug use. Drugs: "Crack" cocaine, Marijuana, and Cocaine.  Allergies  Allergen Reactions  . Shellfish Allergy  Anaphylaxis    Family History  Problem Relation Age of Onset  . Heart attack Mother        x7. Still alive  . Heart attack Father        deceased b/c of MI  . Heart attack Brother        older, deceased b/c of MI   . Cancer Brother        bone cancer in 1 brother, ? brain cancer in other brother  . Heart attack Brother   . Other Brother        MVA  . Other Brother   . Bone cancer Brother   . Cancer Brother   . SIDS Sister      Prior to Admission medications   Medication Sig Start Date End Date Taking? Authorizing Provider  allopurinol (ZYLOPRIM) 300 MG tablet TAKE 1 TABLET (300 MG TOTAL) BY MOUTH DAILY. 08/28/19   Ladell Pier, MD  amLODipine (NORVASC) 5 MG tablet Take 1 tablet (5 mg total) by mouth daily. 12/18/18   Ladell Pier, MD  apixaban Arne Cleveland) 5  MG TABS tablet Take 1 tablet (5 mg total) by mouth 2 (two) times daily. 09/07/19   Elsie Stain, MD  aspirin EC 81 MG tablet Take 1 tablet (81 mg total) by mouth daily. 12/26/18 12/26/19  Vonzella Nipple, NP  Calcium Carb-Cholecalciferol (CALCIUM-VITAMIN D) 600-400 MG-UNIT TABS Take 1 tablet by mouth daily. 05/07/18   Roxan Hockey, MD  chlordiazePOXIDE (LIBRIUM) 10 MG capsule Take one capsule three times day for one week, then one capsule twice a day for one week then one capsule daily until supply is out. 09/07/19   Elsie Stain, MD  cyclobenzaprine (FLEXERIL) 10 MG tablet Take 10 mg by mouth 3 (three) times daily. 08/26/19   [provider]  divalproex (DEPAKOTE) 500 MG DR tablet 1 tablet in the morning, 2 in the evening Patient taking differently: Take 500-1,000 mg by mouth See admin instructions. Take 2 tablet in the morning and 1 in the evening 06/02/19   Kathrynn Ducking, MD  DULoxetine (CYMBALTA) 30 MG capsule TAKE 1 CAPSULE (30 MG TOTAL) BY MOUTH 2 (TWO) TIMES DAILY. 08/28/19   Ladell Pier, MD  famotidine (PEPCID) 20 MG tablet Take 20 mg by mouth 2 (two) times daily. 08/26/19   [provider]  folic acid (FOLVITE) 1 MG tablet Take 1 tablet (1 mg total) by mouth daily. 05/07/18   Roxan Hockey, MD  gabapentin (NEURONTIN) 100 MG capsule Take 2 capsules (200 mg total) by mouth 2 (two) times daily. Patient taking differently: Take 100 mg by mouth 2 (two) times daily.  05/15/19 04/04/20  Domenic Polite, MD  Lacosamide 100 MG TABS Take 1 tablet (100 mg total) by mouth 2 (two) times daily. 09/07/19 10/07/19  Elsie Stain, MD  losartan (COZAAR) 100 MG tablet TAKE 1 TABLET (100 MG TOTAL) BY MOUTH DAILY. 08/28/19   Ladell Pier, MD  metoCLOPramide (REGLAN) 10 MG tablet  08/28/19   [provider]  Multiple Vitamin (MULTIVITAMIN WITH MINERALS) TABS tablet Take 1 tablet by mouth daily. 05/07/18   Roxan Hockey, MD  NARCAN 4 MG/0.1ML LIQD nasal spray kit Place 1 spray into the nose once.  02/12/19   [provider]  nitroGLYCERIN (NITROSTAT) 0.4 MG SL tablet Place 1 tablet (0.4 mg total) under the tongue every 5 (five) minutes as needed for chest pain. 02/20/19   Ladell Pier, MD  oxyCODONE-acetaminophen (PERCOCET) 7.5-325 MG tablet Take 1 tablet by mouth 3 (three) times daily. Patient taking differently: Take 1 tablet by mouth 5 (five) times daily.  12/26/18 12/26/19  Vonzella Nipple, NP  rosuvastatin (CRESTOR) 40 MG tablet Take 1 tablet (40 mg total) by mouth daily. 04/21/19   Ladell Pier, MD  tamsulosin (FLOMAX) 0.4 MG CAPS capsule TAKE 1 CAPSULE BY MOUTH AT BEDTIME. 08/28/19   Ladell Pier, MD  testosterone cypionate (DEPOTESTOSTERONE CYPIONATE) 200 MG/ML injection Inject 200 mg into the muscle every 30 (thirty) days. 08/23/19   [provider]  thiamine (VITAMIN B-1) 100 MG tablet Take 1 tablet (100 mg total) by mouth daily. 09/07/19   Elsie Stain, MD  Vitamin D, Ergocalciferol, (DRISDOL) 1.25 MG (50000 UT) CAPS capsule Take 50,000 Units by mouth every Sunday.     [provider]    Physical Exam: Vitals:    09/25/19 1715 09/25/19 1900 09/25/19 2100 09/25/19 2300  BP:  (!) 161/89 (!) 154/92 (!) 160/91  Pulse:   95 87  Resp:  (!) 27 (!) 31 (!) 33  Temp:      TempSrc:      SpO2:   95% 96%  Weight: 95.3 kg     Height: '5\' 11"'$  (1.803 m)         Constitutional: Acutely ill looking in mild distress Vitals:   09/25/19 1715 09/25/19 1900 09/25/19 2100 09/25/19 2300  BP:  (!) 161/89 (!) 154/92 (!) 160/91  Pulse:   95 87  Resp:  (!) 27 (!) 31 (!) 33  Temp:      TempSrc:      SpO2:   95% 96%  Weight: 95.3 kg     Height: '5\' 11"'$  (1.803 m)      Eyes: PERRL, lids and conjunctivae normal ENMT: Mucous membranes are moist. Posterior pharynx clear of any exudate or lesions.Normal dentition.  Neck: normal, supple, no masses, no thyromegaly Respiratory: Decreased air entry bilaterally with basal crackles, expiratory wheezing and some rhonchi cardiovascular: Tachycardia, no murmurs / rubs / gallops. No extremity edema. 2+ pedal pulses. No carotid bruits.  Abdomen: no tenderness, no masses palpated. No hepatosplenomegaly. Bowel sounds positive.  Musculoskeletal: no clubbing / cyanosis. No joint deformity upper and lower extremities. Good ROM, no contractures. Normal muscle tone.  Skin: no rashes, lesions, ulcers. No induration Neurologic: CN 2-12 grossly intact. Sensation intact, DTR normal. Strength 5/5 in all 4.  Psychiatric: Normal judgment and insight. Alert and oriented x 3.  Very anxious.     Labs on Admission: I have personally reviewed following labs and imaging studies  CBC: Recent Labs  Lab 09/25/19 2100 09/25/19 2143  WBC 5.8  --   NEUTROABS 4.3  --   HGB 14.7 14.6  HCT 46.6 43.0  MCV 90.0  --   PLT 152  --    Basic Metabolic Panel: Recent Labs  Lab 09/25/19 2100 09/25/19 2143  NA 136 134*  K 4.1 3.7  CL 100  --   CO2 25  --   GLUCOSE 101*  --   BUN 15  --   CREATININE 1.50*  --   CALCIUM 8.8*  --    GFR: Estimated Creatinine Clearance: 58.6 mL/min (A) (by C-G formula  based on SCr of 1.5 mg/dL (H)). Liver Function Tests: Recent Labs  Lab 09/25/19 2100  AST 71*  ALT 47*  ALKPHOS 39  BILITOT 0.8  PROT 6.8  ALBUMIN 3.8   No results for input(s): LIPASE, AMYLASE in the last 168 hours. No results for input(s): AMMONIA in the last 168 hours. Coagulation Profile: No results for input(s): INR, PROTIME in the last 168 hours. Cardiac Enzymes: No results for input(s): CKTOTAL, CKMB, CKMBINDEX, TROPONINI in the last 168 hours. BNP (last 3 results) No results for input(s): PROBNP in the last 8760 hours. HbA1C: No results for input(s): HGBA1C in the last 72 hours. CBG: No results for input(s): GLUCAP in the last 168 hours. Lipid Profile: Recent Labs    09/25/19 2100  TRIG 133   Thyroid Function Tests: No results for input(s): TSH, T4TOTAL, FREET4, T3FREE, THYROIDAB in the last 72 hours. Anemia Panel: Recent Labs    09/25/19 2100  FERRITIN 26   Urine analysis:    Component Value Date/Time   COLORURINE STRAW (A) 09/25/2019 2100   APPEARANCEUR CLEAR 09/25/2019 2100   LABSPEC 1.009 09/25/2019 2100   PHURINE 6.0 09/25/2019 2100   GLUCOSEU NEGATIVE 09/25/2019 2100   HGBUR NEGATIVE 09/25/2019 2100   BILIRUBINUR NEGATIVE 09/25/2019 2100   BILIRUBINUR negative 09/08/2018 1640   KETONESUR NEGATIVE 09/25/2019 2100  PROTEINUR NEGATIVE 09/25/2019 2100   UROBILINOGEN 2.0 (A) 09/08/2018 1640   UROBILINOGEN 1.0 02/04/2015 0647   NITRITE NEGATIVE 09/25/2019 2100   LEUKOCYTESUR NEGATIVE 09/25/2019 2100   Sepsis Labs: '@LABRCNTIP'$ (procalcitonin:4,lacticidven:4) )No results found for this or any previous visit (from the past 240 hour(s)).   Radiological Exams on Admission: Ct Head Wo Contrast  Result Date: 09/25/2019 CLINICAL DATA:  Worst headache of life. EXAM: CT HEAD WITHOUT CONTRAST TECHNIQUE: Contiguous axial images were obtained from the base of the skull through the vertex without intravenous contrast. COMPARISON:  August 19, 2019 FINDINGS:  Brain: No subdural, epidural, or subarachnoid hemorrhage. Ventricles and sulci are stable. No acute cortical ischemia or infarct. No mass effect or midline shift. Cerebellum, brainstem, and basal cisterns are normal. Vascular: No hyperdense vessel or unexpected calcification. Skull: Normal. Negative for fracture or focal lesion. Sinuses/Orbits: No acute finding. Other: None. IMPRESSION: No cause for headache identified. No acute intracranial abnormalities. Electronically Signed   By: Dorise Bullion III M.D   On: 09/25/2019 19:39   Dg Chest Port 1 View  Result Date: 09/25/2019 CLINICAL DATA:  Shortness of breath, headache, weakness, cough, body aches, fever, history CHF, COPD, asthma EXAM: PORTABLE CHEST 1 VIEW COMPARISON:  Portable exam 1730 hours compared to 08/19/2019 FINDINGS: Enlargement of cardiac silhouette. Mediastinal contours and pulmonary vascularity normal. Elevation of LEFT diaphragm. Bibasilar atelectasis. No definite infiltrate, pleural effusion or pneumothorax. Gaseous distention of stomach. Prior cervical fusion. IMPRESSION: Low lung volumes with bibasilar atelectasis. Gaseous distention of stomach. Electronically Signed   By: Lavonia Dana M.D.   On: 09/25/2019 18:23    Assessment/Plan Principal Problem:   Pneumonia due to COVID-19 virus Active Problems:   Essential hypertension   Polysubstance abuse ([place])   CAD-50% LAD 2011   Dyslipidemia   Depression   Diabetes mellitus type 2 in nonobese ([place])   History of pulmonary embolus (PE)   Gastroesophageal reflux disease without esophagitis   Benign prostatic hyperplasia (BPH) with straining on urination   Alcohol use     #1 acute respiratory failure: Secondary to COVID-19 pneumonia.  Patient has hypoxia.  Will place patient on oxygen and continue supportive care.  Treat underlying Covid.  Monitor oxygen level.  #2 COVID-19 pneumonia: Patient will be admitted to medical telemetry.  We will keep him at [facility] since his oxygen  level is currently at 3 L only.  Initiate remdesivir, dexamethasone, albuterol inhalers, azithromycin as well as oxygen.  #3 diabetes: Sliding scale insulin with home regimen.  #4 polysubstance abuse: Patient denied any recent use today.  Monitor for withdrawal and if so CIWA protocol will be initiated.  #5 essential hypertension: Blood pressure appears well controlled at the moment.  Continue monitor  #6 coronary artery disease: Appears stable.  No evidence of decompensation.  #7 BPH: Continue home regimen.  #8 bipolar disorder: Continue with home regimen.   DVT prophylaxis: Apixaban Code Status: Full code Family Communication: No family at bedside Disposition Plan: Home Consults called: None Admission status: Inpatient  Severity of Illness: The appropriate patient status for this patient is INPATIENT. Inpatient status is judged to be reasonable and necessary in order to provide the required intensity of service to ensure the patient's safety. The patient's presenting symptoms, physical exam findings, and initial radiographic and laboratory data in the context of their chronic comorbidities is felt to place them at high risk for further clinical deterioration. Furthermore, it is not anticipated that the patient will be medically stable for discharge from the  hospital within 2 midnights of admission. The following factors support the patient status of inpatient.   " The patient's presenting symptoms include shortness of breath and fever. " The worrisome physical exam findings include temperature 101.4. " The initial radiographic and laboratory data are worrisome because of bibasilar atelectasis and infiltrates. " The chronic co-morbidities include polysubstance abuse with diabetes.   * I certify that at the point of admission it is my clinical judgment that the patient will require inpatient hospital care spanning beyond 2 midnights from the point of admission due to high intensity of  service, high risk for further deterioration and high frequency of surveillance required.Barbette Merino MD [facility] Pager [phone]  If 7PM-7AM, please contact night-coverage [email or web address] Password [email or web address]  09/25/2019, 11:18 PM

## 2019-09-26 LAB — FERRITIN: Ferritin: 28 ng/mL (ref 24–336)

## 2019-09-26 LAB — TROPONIN I (HIGH SENSITIVITY): Troponin I (High Sensitivity): 7 ng/L (ref <18)

## 2019-09-26 LAB — COMPREHENSIVE METABOLIC PANEL
ALT: 40 U/L (ref 0–44)
AST: 55 U/L — ABNORMAL HIGH (ref 15–41)
Albumin: 3.4 g/dL — ABNORMAL LOW (ref 3.5–5.0)
Alkaline Phosphatase: 38 U/L (ref 38–126)
Anion gap: 11 (ref 5–15)
BUN: 14 mg/dL (ref 8–23)
CO2: 22 mmol/L (ref 22–32)
Calcium: 8.4 mg/dL — ABNORMAL LOW (ref 8.9–10.3)
Chloride: 103 mmol/L (ref 98–111)
Creatinine, Ser: 1.38 mg/dL — ABNORMAL HIGH (ref 0.61–1.24)
GFR calc Af Amer: >60 mL/min (ref >60)
GFR calc non Af Amer: 54 mL/min — ABNORMAL LOW (ref >60)
Glucose, Bld: 99 mg/dL (ref 70–99)
Potassium: 4.1 mmol/L (ref 3.5–5.1)
Sodium: 136 mmol/L (ref 135–145)
Total Bilirubin: 0.7 mg/dL (ref 0.3–1.2)
Total Protein: 6.1 g/dL — ABNORMAL LOW (ref 6.5–8.1)

## 2019-09-26 LAB — CBC WITH DIFFERENTIAL/PLATELET
Abs Immature Granulocytes: 0.03 10*3/uL (ref 0.00–0.07)
Basophils Absolute: 0 10*3/uL (ref 0.0–0.1)
Basophils Relative: 0 %
Eosinophils Absolute: 0 10*3/uL (ref 0.0–0.5)
Eosinophils Relative: 0 %
HCT: 42.9 % (ref 39.0–52.0)
Hemoglobin: 13.5 g/dL (ref 13.0–17.0)
Immature Granulocytes: 1 %
Lymphocytes Relative: 10 %
Lymphs Abs: 0.5 10*3/uL — ABNORMAL LOW (ref 0.7–4.0)
MCH: 28.3 pg (ref 26.0–34.0)
MCHC: 31.5 g/dL (ref 30.0–36.0)
MCV: 89.9 fL (ref 80.0–100.0)
Monocytes Absolute: 0.7 10*3/uL (ref 0.1–1.0)
Monocytes Relative: 15 %
Neutro Abs: 3.7 10*3/uL (ref 1.7–7.7)
Neutrophils Relative %: 74 %
Platelets: 139 10*3/uL — ABNORMAL LOW (ref 150–400)
RBC: 4.77 MIL/uL (ref 4.22–5.81)
RDW: 15.3 % (ref 11.5–15.5)
WBC: 5 10*3/uL (ref 4.0–10.5)
nRBC: 0 % (ref 0.0–0.2)

## 2019-09-26 LAB — CBG MONITORING, ED
Glucose-Capillary: 127 mg/dL — ABNORMAL HIGH (ref 70–99)
Glucose-Capillary: 129 mg/dL — ABNORMAL HIGH (ref 70–99)
Glucose-Capillary: 138 mg/dL — ABNORMAL HIGH (ref 70–99)
Glucose-Capillary: 172 mg/dL — ABNORMAL HIGH (ref 70–99)
Glucose-Capillary: 93 mg/dL (ref 70–99)

## 2019-09-26 LAB — C-REACTIVE PROTEIN: CRP: 2.4 mg/dL — ABNORMAL HIGH (ref <1.0)

## 2019-09-26 LAB — LACTIC ACID, PLASMA: Lactic Acid, Venous: 2.8 mmol/L (ref 0.5–1.9)

## 2019-09-26 LAB — SARS CORONAVIRUS 2 (TAT 6-24 HRS): SARS Coronavirus 2: POSITIVE — AB

## 2019-09-26 MED ORDER — AMLODIPINE BESYLATE 5 MG PO TABS
5.0000 mg | ORAL_TABLET | Freq: Every day | ORAL | Status: DC
  Administered 2019-09-26 – 2019-09-29 (×4): 5 mg via ORAL
  Filled 2019-09-26 (×4): qty 1

## 2019-09-26 MED ORDER — FAMOTIDINE 20 MG PO TABS
20.0000 mg | ORAL_TABLET | Freq: Two times a day (BID) | ORAL | Status: DC
  Administered 2019-09-26 – 2019-09-29 (×7): 20 mg via ORAL
  Filled 2019-09-26 (×7): qty 1

## 2019-09-26 MED ORDER — SODIUM CHLORIDE 0.9 % IV SOLN: 200.0000 mg | Freq: Once | INTRAVENOUS | Status: DC

## 2019-09-26 MED ORDER — NITROGLYCERIN 0.4 MG SL SUBL: 0.4000 mg | SUBLINGUAL_TABLET | SUBLINGUAL | Status: DC | PRN

## 2019-09-26 MED ORDER — ASPIRIN EC 81 MG PO TBEC
81.0000 mg | DELAYED_RELEASE_TABLET | Freq: Every day | ORAL | Status: DC
  Administered 2019-09-26 – 2019-09-29 (×4): 81 mg via ORAL
  Filled 2019-09-26 (×4): qty 1

## 2019-09-26 MED ORDER — CYCLOBENZAPRINE HCL 5 MG PO TABS
10.0000 mg | ORAL_TABLET | Freq: Three times a day (TID) | ORAL | Status: DC
  Administered 2019-09-26 – 2019-09-29 (×9): 10 mg via ORAL
  Filled 2019-09-26 (×4): qty 1
  Filled 2019-09-26 (×3): qty 2
  Filled 2019-09-26: qty 1
  Filled 2019-09-26 (×2): qty 2
  Filled 2019-09-26 (×4): qty 1
  Filled 2019-09-26: qty 2

## 2019-09-26 MED ORDER — LACOSAMIDE 200 MG PO TABS
100.0000 mg | ORAL_TABLET | Freq: Two times a day (BID) | ORAL | Status: DC
  Administered 2019-09-26 – 2019-09-29 (×5): 100 mg via ORAL
  Filled 2019-09-26: qty 1
  Filled 2019-09-26 (×4): qty 2

## 2019-09-26 MED ORDER — DEXAMETHASONE SODIUM PHOSPHATE 10 MG/ML IJ SOLN
6.0000 mg | INTRAMUSCULAR | Status: DC
  Administered 2019-09-26 – 2019-09-27 (×3): 6 mg via INTRAVENOUS
  Filled 2019-09-26 (×3): qty 1

## 2019-09-26 MED ORDER — ALLOPURINOL 100 MG PO TABS
300.0000 mg | ORAL_TABLET | Freq: Every day | ORAL | Status: DC
  Administered 2019-09-26 – 2019-09-29 (×4): 300 mg via ORAL
  Filled 2019-09-26 (×4): qty 3

## 2019-09-26 MED ORDER — ROSUVASTATIN CALCIUM 5 MG PO TABS
40.0000 mg | ORAL_TABLET | Freq: Every day | ORAL | Status: DC
  Administered 2019-09-26 – 2019-09-29 (×4): 40 mg via ORAL
  Filled 2019-09-26: qty 2
  Filled 2019-09-26 (×2): qty 8
  Filled 2019-09-26: qty 2

## 2019-09-26 MED ORDER — FOLIC ACID 1 MG PO TABS
1.0000 mg | ORAL_TABLET | Freq: Every day | ORAL | Status: DC
  Administered 2019-09-26 – 2019-09-29 (×4): 1 mg via ORAL
  Filled 2019-09-26 (×4): qty 1

## 2019-09-26 MED ORDER — DULOXETINE HCL 30 MG PO CPEP
30.0000 mg | ORAL_CAPSULE | Freq: Two times a day (BID) | ORAL | Status: DC
  Administered 2019-09-26 – 2019-09-29 (×7): 30 mg via ORAL
  Filled 2019-09-26 (×9): qty 1

## 2019-09-26 MED ORDER — ACETAMINOPHEN 325 MG PO TABS
650.0000 mg | ORAL_TABLET | Freq: Four times a day (QID) | ORAL | Status: DC | PRN
  Administered 2019-09-26 – 2019-09-27 (×2): 650 mg via ORAL
  Filled 2019-09-26 (×3): qty 2

## 2019-09-26 MED ORDER — INSULIN ASPART 100 UNIT/ML ~~LOC~~ SOLN
0.0000 [IU] | Freq: Three times a day (TID) | SUBCUTANEOUS | Status: DC
  Administered 2019-09-26 (×2): 1 [IU] via SUBCUTANEOUS
  Administered 2019-09-26: 2 [IU] via SUBCUTANEOUS
  Administered 2019-09-27 – 2019-09-28 (×5): 1 [IU] via SUBCUTANEOUS
  Administered 2019-09-29: 2 [IU] via SUBCUTANEOUS
  Administered 2019-09-29: 3 [IU] via SUBCUTANEOUS

## 2019-09-26 MED ORDER — ONDANSETRON HCL 4 MG PO TABS: 4.0000 mg | ORAL_TABLET | Freq: Four times a day (QID) | ORAL | Status: DC | PRN

## 2019-09-26 MED ORDER — LOSARTAN POTASSIUM 50 MG PO TABS
100.0000 mg | ORAL_TABLET | Freq: Every day | ORAL | Status: DC
  Administered 2019-09-26 – 2019-09-29 (×4): 100 mg via ORAL
  Filled 2019-09-26 (×4): qty 2

## 2019-09-26 MED ORDER — OXYCODONE-ACETAMINOPHEN 7.5-325 MG PO TABS
1.0000 | ORAL_TABLET | Freq: Three times a day (TID) | ORAL | Status: DC
  Administered 2019-09-26 (×2): 1 via ORAL
  Filled 2019-09-26 (×2): qty 1

## 2019-09-26 MED ORDER — AZITHROMYCIN 250 MG PO TABS
500.0000 mg | ORAL_TABLET | Freq: Once | ORAL | Status: AC
  Administered 2019-09-26: 500 mg via ORAL
  Filled 2019-09-26: qty 2

## 2019-09-26 MED ORDER — SODIUM CHLORIDE 0.9 % IV SOLN: 100.0000 mg | Freq: Every day | INTRAVENOUS | Status: DC

## 2019-09-26 MED ORDER — ALBUTEROL SULFATE HFA 108 (90 BASE) MCG/ACT IN AERS
2.0000 | INHALATION_SPRAY | Freq: Four times a day (QID) | RESPIRATORY_TRACT | Status: DC
  Administered 2019-09-26 – 2019-09-29 (×14): 2 via RESPIRATORY_TRACT
  Filled 2019-09-26 (×3): qty 6.7

## 2019-09-26 MED ORDER — ENOXAPARIN SODIUM 40 MG/0.4ML ~~LOC~~ SOLN: 40.0000 mg | SUBCUTANEOUS | Status: DC

## 2019-09-26 MED ORDER — ONDANSETRON HCL 4 MG/2ML IJ SOLN
4.0000 mg | Freq: Four times a day (QID) | INTRAMUSCULAR | Status: DC | PRN
  Administered 2019-09-26: 4 mg via INTRAVENOUS
  Filled 2019-09-26: qty 2

## 2019-09-26 MED ORDER — INSULIN ASPART 100 UNIT/ML ~~LOC~~ SOLN: 0.0000 [IU] | Freq: Every day | SUBCUTANEOUS | Status: DC

## 2019-09-26 MED ORDER — GABAPENTIN 100 MG PO CAPS
100.0000 mg | ORAL_CAPSULE | Freq: Two times a day (BID) | ORAL | Status: DC
  Administered 2019-09-26 – 2019-09-29 (×7): 100 mg via ORAL
  Filled 2019-09-26 (×8): qty 1

## 2019-09-26 MED ORDER — TAMSULOSIN HCL 0.4 MG PO CAPS
0.4000 mg | ORAL_CAPSULE | Freq: Every day | ORAL | Status: DC
  Administered 2019-09-26 – 2019-09-28 (×3): 0.4 mg via ORAL
  Filled 2019-09-26 (×3): qty 1

## 2019-09-26 MED ORDER — SODIUM CHLORIDE 0.9 % IV SOLN
INTRAVENOUS | Status: DC
  Administered 2019-09-26 – 2019-09-28 (×7): via INTRAVENOUS

## 2019-09-26 MED ORDER — DIVALPROEX SODIUM 250 MG PO DR TAB: 500.0000 mg | DELAYED_RELEASE_TABLET | ORAL | Status: DC

## 2019-09-26 MED ORDER — VITAMIN B-1 100 MG PO TABS
100.0000 mg | ORAL_TABLET | Freq: Every day | ORAL | Status: DC
  Administered 2019-09-26 – 2019-09-29 (×4): 100 mg via ORAL
  Filled 2019-09-26 (×4): qty 1

## 2019-09-26 NOTE — ED Notes (Signed)
Pt noted to be saturated in urine from feet to mid back. Complete bed change provided, condom cath applied. Oral Temp 103.1

## 2019-09-26 NOTE — ED Notes (Signed)
+  tele ordered bfast

## 2019-09-26 NOTE — ED Notes (Signed)
Date and time results received: 09/26/19 0050 (use smartphrase ".now" to insert current time)  Test: Lactic Acid Critical Value: 2.8  Name of Provider Notified: Harlow Ohms  Orders Received? Or Actions Taken?: MD paged. No answer.

## 2019-09-26 NOTE — ED Notes (Signed)
Spoke with admitting MD, OK to trial pt off of oxygen, O2 decreased to RA from 1.5L, pt spO2 97%.

## 2019-09-26 NOTE — ED Notes (Signed)
DINNER TRAY ORDERED  

## 2019-09-26 NOTE — Progress Notes (Signed)
PROGRESS NOTE    Russell Carter  AST:[id number] DOB: 13-Apr-1956 DOA: 09/25/2019 PCP: Ladell Pier, MD    Brief Narrative:  63 year old male with an extensive medical issues including coronary artery disease, osteoarthritis, polysubstance abuse, chronic anticoagulation for pulmonary embolism, cocaine abuse, hyperlipidemia, anxiety disorder, CKD stage III, seizure disorder, history of CVA and bipolar presented to the emergency room with shortness of breath, cough and fever for 1 day.  He was also complaining of severe headache.  He does have chronic headaches but this was worse headache in his life.  Took some hydrocodone at home with no help.  Initially in the emergency room, EMS found him with oxygen 88% on room air.  Chest x-ray shows bilateral lower lobe atelectasis.  COVID-19 positive.   Assessment & Plan:   Principal Problem:   Pneumonia due to COVID-19 virus Active Problems:   Essential hypertension   Polysubstance abuse ([place])   CAD-50% LAD 2011   Dyslipidemia   Depression   Diabetes mellitus type 2 in nonobese (HCC)   History of pulmonary embolus (PE)   Gastroesophageal reflux disease without esophagitis   Benign prostatic hyperplasia (BPH) with straining on urination   Alcohol use  Pneumonia due to COVID-19 virus with hypoxia: Continue to monitor due to significant symptoms  chest physiotherapy, incentive spirometry, deep breathing exercises, sputum induction, mucolytic's and bronchodilators. Supplemental oxygen to keep saturations more than 90%. Covid directed therapy with , steroids, dexamethasone IV continue remdesivir, day 2/5. antibiotics not indicated. Due to severity of symptoms, patient will need daily inflammatory markers, chest x-rays, liver function test to monitor and direct COVID-19 therapies.  Hypertension: Resume home medications.  History of PE on Eliquis: Continue Eliquis.  Anxiety depression and bipolar disorder: On multiple medications that we  will resume.  Also on chronic opiates that he will resume.  Diet-controlled diabetes type 2: On steroids now.  Cover with sliding scale insulin.  Seizure disorder: On Depakote that he will continue.  Polysubstance abuse/alcohol abuse: Patient denies ongoing use of alcohol or illicit substances.  Will monitor.   DVT prophylaxis: Eliquis Code Status: Full code Family Communication: None Disposition Plan: Continue inpatient hospitalization to treat COVID-19 associated pneumonia.   Consultants:   None  Procedures:   None  Antimicrobials:   Remdesivir, 09/25/2019---   Subjective: Patient seen and examined.  Still remains in the emergency room waiting for inpatient bed assignment.  Patient stated his headache is slightly better now.  Remains afebrile.  On 1 to 2 L of oxygen but mostly saturating more than 96%.  Has some cough but denies any shortness of breath.  He felt very weak on ambulation.  Denies nausea vomiting.  Objective: Vitals:   09/26/19 0800 09/26/19 0900 09/26/19 1000 09/26/19 1100  BP: (!) 133/104 (!) 141/87 (!) 156/90 (!) 152/94  Pulse: 78 74 73 79  Resp: (!) 23 (!) 23 20 20   Temp:      TempSrc:      SpO2: 97% 97% 98% 93%  Weight:      Height:        Intake/Output Summary (Last 24 hours) at 09/26/2019 1246 Last data filed at 09/26/2019 0500 Gross per 24 hour  Intake -  Output 1400 ml  Net -1400 ml   Filed Weights   09/25/19 1715  Weight: 95.3 kg    Examination:  General exam: Appears calm and comfortable, on 1 to 2 L oxygen.  Looks comfortable. Respiratory system: Clear to auscultation. Respiratory effort normal.  No added  sounds. Cardiovascular system: S1 & S2 heard, RRR. No JVD, murmurs, rubs, gallops or clicks. No pedal edema. Gastrointestinal system: Abdomen is nondistended, soft and nontender. No organomegaly or masses felt. Normal bowel sounds heard. Central nervous system: Alert and oriented. No focal neurological deficits. Extremities:  Symmetric 5 x 5 power. Skin: No rashes, lesions or ulcers Psychiatry: Judgement and insight appear normal. Mood & affect flat.    Data Reviewed: I have personally reviewed following labs and imaging studies  CBC: Recent Labs  Lab 09/25/19 2100 09/25/19 2143 09/26/19 0015  WBC 5.8  --  5.0  NEUTROABS 4.3  --  3.7  HGB 14.7 14.6 13.5  HCT 46.6 43.0 42.9  MCV 90.0  --  89.9  PLT 152  --  139*   Basic Metabolic Panel: Recent Labs  Lab 09/25/19 2100 09/25/19 2143 09/26/19 0015  NA 136 134* 136  K 4.1 3.7 4.1  CL 100  --  103  CO2 25  --  22  GLUCOSE 101*  --  99  BUN 15  --  14  CREATININE 1.50*  --  1.38*  CALCIUM 8.8*  --  8.4*   GFR: Estimated Creatinine Clearance: 63.7 mL/min (A) (by C-G formula based on SCr of 1.38 mg/dL (H)). Liver Function Tests: Recent Labs  Lab 09/25/19 2100 09/26/19 0015  AST 71* 55*  ALT 47* 40  ALKPHOS 39 38  BILITOT 0.8 0.7  PROT 6.8 6.1*  ALBUMIN 3.8 3.4*   No results for input(s): LIPASE, AMYLASE in the last 168 hours. No results for input(s): AMMONIA in the last 168 hours. Coagulation Profile: No results for input(s): INR, PROTIME in the last 168 hours. Cardiac Enzymes: No results for input(s): CKTOTAL, CKMB, CKMBINDEX, TROPONINI in the last 168 hours. BNP (last 3 results) No results for input(s): PROBNP in the last 8760 hours. HbA1C: No results for input(s): HGBA1C in the last 72 hours. CBG: Recent Labs  Lab 09/26/19 0020 09/26/19 0828 09/26/19 1222  GLUCAP 93 172* 129*   Lipid Profile: Recent Labs    09/25/19 2100  TRIG 133   Thyroid Function Tests: No results for input(s): TSH, T4TOTAL, FREET4, T3FREE, THYROIDAB in the last 72 hours. Anemia Panel: Recent Labs    09/25/19 2100 09/26/19 0015  FERRITIN 26 28   Sepsis Labs: Recent Labs  Lab 09/25/19 2100 09/26/19 0009  PROCALCITON <0.10  --   LATICACIDVEN  --  2.8*    Recent Results (from the past 240 hour(s))  SARS CORONAVIRUS 2 (TAT 6-24 HRS)  Nasopharyngeal Nasopharyngeal Swab     Status: Abnormal   Collection Time: 09/25/19  9:38 PM   Specimen: Nasopharyngeal Swab  Result Value Ref Range Status   SARS Coronavirus 2 POSITIVE (A) NEGATIVE Final    Comment: RESULT CALLED TO, READ BACK BY AND VERIFIED WITHRoyden Purl RN [phone] 09/26/2019 T. TYSOR (NOTE) SARS-CoV-2 target nucleic acids are DETECTED. The SARS-CoV-2 RNA is generally detectable in upper and lower respiratory specimens during the acute phase of infection. Positive results are indicative of the presence of SARS-CoV-2 RNA. Clinical correlation with patient history and other diagnostic information is  necessary to determine patient infection status. Positive results do not rule out bacterial infection or co-infection with other viruses.  The expected result is Negative. Fact Sheet for Patients: HairSlick.no Fact Sheet for Healthcare Providers: quierodirigir.com This test is not yet approved or cleared by the [place] FDA and  has been authorized for detection and/or diagnosis of SARS-CoV-2 by FDA  under an Emergency Use Authorization (EUA). This EUA will remain  in effect (meaning this test can be use d) for the duration of the COVID-19 declaration under Section 564(b)(1) of the Act, 21 U.S.C. section 360bbb-3(b)(1), unless the authorization is terminated or revoked sooner. Performed at [facility], [address], [place], [place] [address]          Radiology Studies: Ct Head Wo Contrast  Result Date: 09/25/2019 CLINICAL DATA:  Worst headache of life. EXAM: CT HEAD WITHOUT CONTRAST TECHNIQUE: Contiguous axial images were obtained from the base of the skull through the vertex without intravenous contrast. COMPARISON:  August 19, 2019 FINDINGS: Brain: No subdural, epidural, or subarachnoid hemorrhage. Ventricles and sulci are stable. No acute cortical ischemia or infarct. No mass effect or  midline shift. Cerebellum, brainstem, and basal cisterns are normal. Vascular: No hyperdense vessel or unexpected calcification. Skull: Normal. Negative for fracture or focal lesion. Sinuses/Orbits: No acute finding. Other: None. IMPRESSION: No cause for headache identified. No acute intracranial abnormalities. Electronically Signed   By: Gerome Sam III M.D   On: 09/25/2019 19:39   Dg Chest Port 1 View  Result Date: 09/25/2019 CLINICAL DATA:  Shortness of breath, headache, weakness, cough, body aches, fever, history CHF, COPD, asthma EXAM: PORTABLE CHEST 1 VIEW COMPARISON:  Portable exam 1730 hours compared to 08/19/2019 FINDINGS: Enlargement of cardiac silhouette. Mediastinal contours and pulmonary vascularity normal. Elevation of LEFT diaphragm. Bibasilar atelectasis. No definite infiltrate, pleural effusion or pneumothorax. Gaseous distention of stomach. Prior cervical fusion. IMPRESSION: Low lung volumes with bibasilar atelectasis. Gaseous distention of stomach. Electronically Signed   By: Ulyses Southward M.D.   On: 09/25/2019 18:23        Scheduled Meds: . albuterol  2 puff Inhalation Q6H  . apixaban  5 mg Oral BID  . dexamethasone (DECADRON) injection  6 mg Intravenous Q24H  . insulin aspart  0-5 Units Subcutaneous QHS  . insulin aspart  0-9 Units Subcutaneous TID WC   Continuous Infusions: . sodium chloride 125 mL/hr at 09/26/19 1035  . [START ON 09/27/2019] remdesivir 100 mg in NS 100 mL       LOS: 1 day    Time spent: 25 minutes    Dorcas Carrow, MD [facility] Pager [phone]

## 2019-09-26 NOTE — ED Notes (Signed)
Able to reduce O2 to 1.5L from 2L while remaining 98%. Pt still lethargic, unable to safely perform ambulation for orders to obtain spO2 while ambulating

## 2019-09-27 DIAGNOSIS — U071 COVID-19: Secondary | ICD-10-CM | POA: Diagnosis present

## 2019-09-27 LAB — COMPREHENSIVE METABOLIC PANEL
ALT: 30 U/L (ref 0–44)
AST: 27 U/L (ref 15–41)
Albumin: 2.8 g/dL — ABNORMAL LOW (ref 3.5–5.0)
Alkaline Phosphatase: 34 U/L — ABNORMAL LOW (ref 38–126)
Anion gap: 7 (ref 5–15)
BUN: 17 mg/dL (ref 8–23)
CO2: 21 mmol/L — ABNORMAL LOW (ref 22–32)
Calcium: 7.8 mg/dL — ABNORMAL LOW (ref 8.9–10.3)
Chloride: 110 mmol/L (ref 98–111)
Creatinine, Ser: 1.03 mg/dL (ref 0.61–1.24)
GFR calc Af Amer: >60 mL/min (ref >60)
GFR calc non Af Amer: >60 mL/min (ref >60)
Glucose, Bld: 181 mg/dL — ABNORMAL HIGH (ref 70–99)
Potassium: 4.7 mmol/L (ref 3.5–5.1)
Sodium: 138 mmol/L (ref 135–145)
Total Bilirubin: 0.6 mg/dL (ref 0.3–1.2)
Total Protein: 5.7 g/dL — ABNORMAL LOW (ref 6.5–8.1)

## 2019-09-27 LAB — FERRITIN: Ferritin: 108 ng/mL (ref 24–336)

## 2019-09-27 LAB — URINE CULTURE: Culture: NO GROWTH

## 2019-09-27 LAB — CBC WITH DIFFERENTIAL/PLATELET
Abs Immature Granulocytes: 0.01 10*3/uL (ref 0.00–0.07)
Basophils Absolute: 0 10*3/uL (ref 0.0–0.1)
Basophils Relative: 0 %
Eosinophils Absolute: 0 10*3/uL (ref 0.0–0.5)
Eosinophils Relative: 0 %
HCT: 42.7 % (ref 39.0–52.0)
Hemoglobin: 13.6 g/dL (ref 13.0–17.0)
Immature Granulocytes: 0 %
Lymphocytes Relative: 18 %
Lymphs Abs: 0.5 10*3/uL — ABNORMAL LOW (ref 0.7–4.0)
MCH: 28.5 pg (ref 26.0–34.0)
MCHC: 31.9 g/dL (ref 30.0–36.0)
MCV: 89.5 fL (ref 80.0–100.0)
Monocytes Absolute: 0.4 10*3/uL (ref 0.1–1.0)
Monocytes Relative: 12 %
Neutro Abs: 2 10*3/uL (ref 1.7–7.7)
Neutrophils Relative %: 70 %
Platelets: 143 10*3/uL — ABNORMAL LOW (ref 150–400)
RBC: 4.77 MIL/uL (ref 4.22–5.81)
RDW: 15.1 % (ref 11.5–15.5)
WBC: 2.9 10*3/uL — ABNORMAL LOW (ref 4.0–10.5)
nRBC: 0 % (ref 0.0–0.2)

## 2019-09-27 LAB — CBG MONITORING, ED: Glucose-Capillary: 150 mg/dL — ABNORMAL HIGH (ref 70–99)

## 2019-09-27 LAB — GLUCOSE, CAPILLARY
Glucose-Capillary: 138 mg/dL — ABNORMAL HIGH (ref 70–99)
Glucose-Capillary: 148 mg/dL — ABNORMAL HIGH (ref 70–99)

## 2019-09-27 LAB — C-REACTIVE PROTEIN: CRP: 0.8 mg/dL (ref <1.0)

## 2019-09-27 IMAGING — CT CT HEAD CODE STROKE
3 series · 16 of 47 positions shown, 19 images · non-contrast
Comparison: Head CT 05/05/2018

CLINICAL DATA: Code stroke. Left facial droop and left-sided
weakness.

EXAM:
CT HEAD WITHOUT CONTRAST
TECHNIQUE: Contiguous axial images were obtained from the base of the skull
through the vertex without intravenous contrast.

[Series 3: head 5.0 st · axial · 0.45mm/px · z∈[-137,+23]mm · 10 of 38 slices shown, 13 images]
[im 3/38  brain]
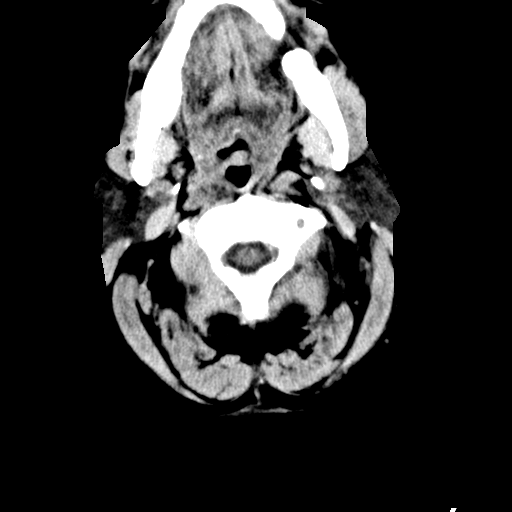
[im 3/38  bone]
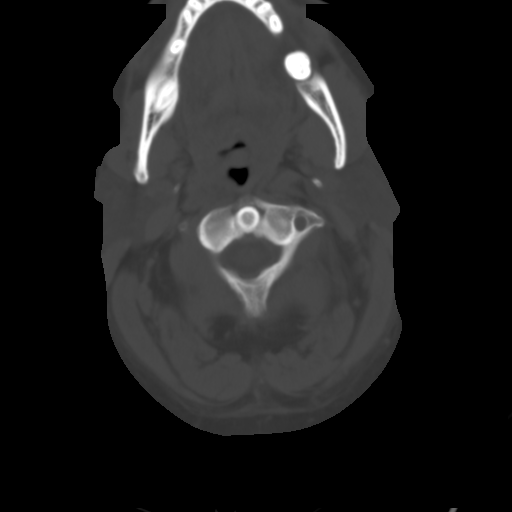
[im 7/38  brain]
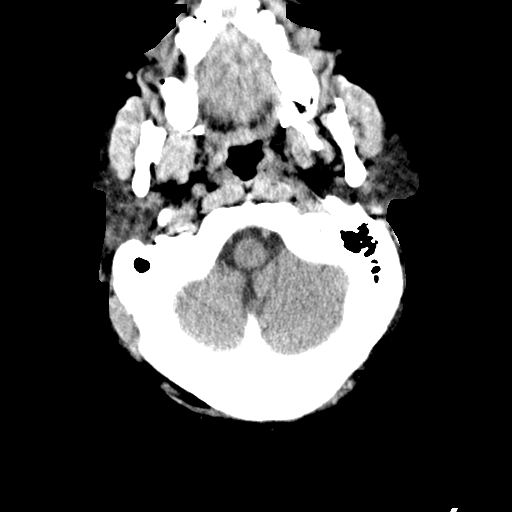
[im 11/38  brain]
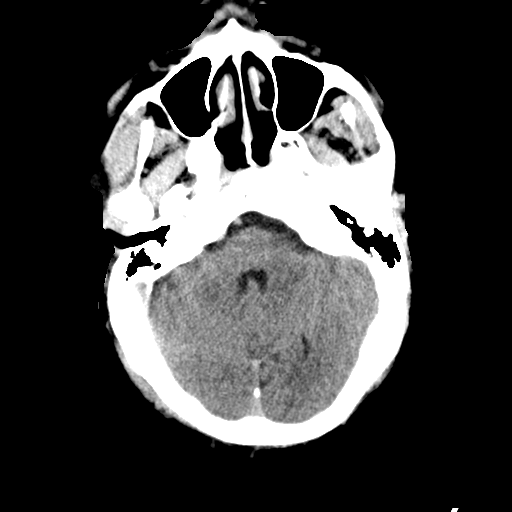
[im 13/38  brain]
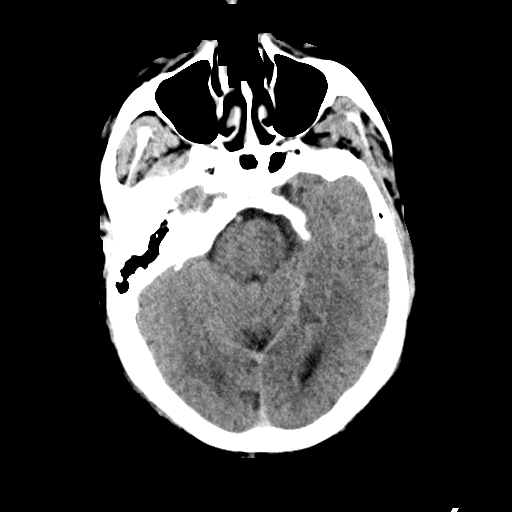
[im 17/38  brain]
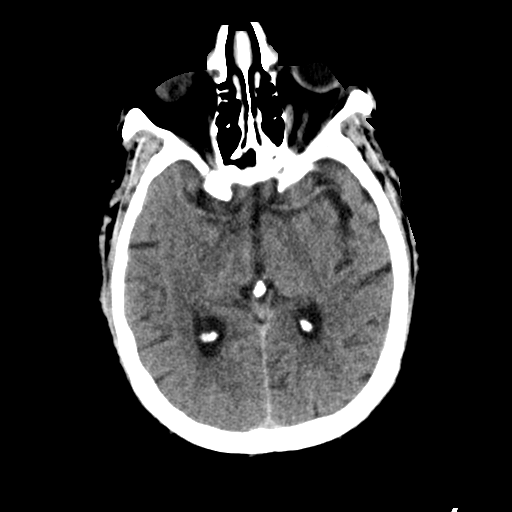
[im 17/38  bone]
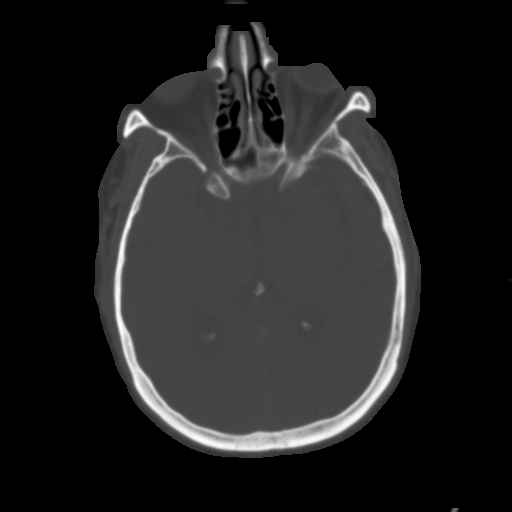
[im 21/38  brain]
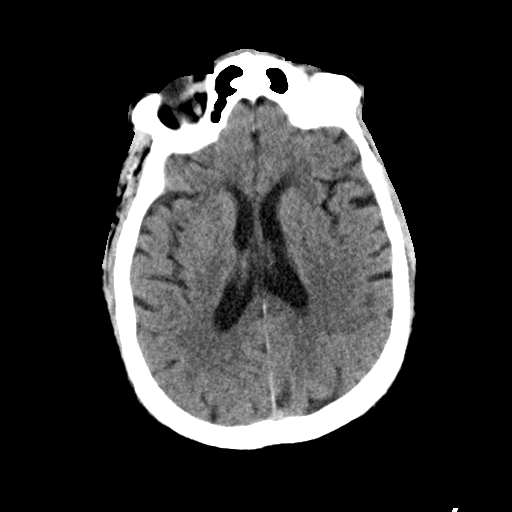
[im 25/38  brain]
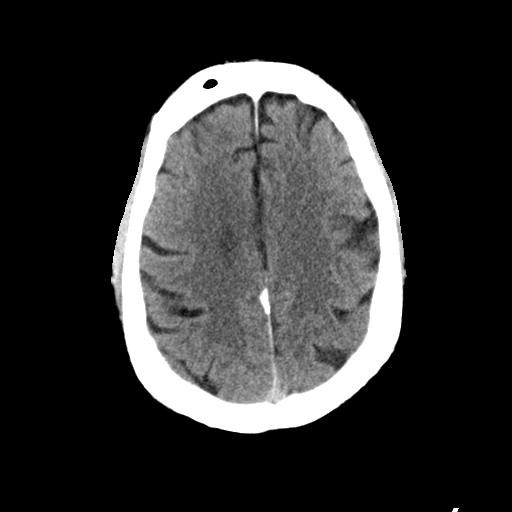
[im 29/38  brain]
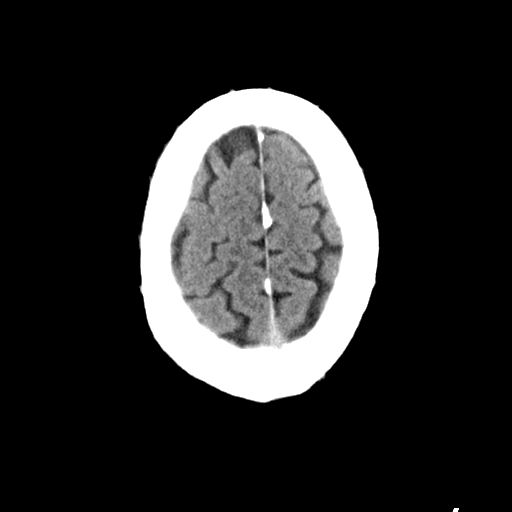
[im 31/38  brain]
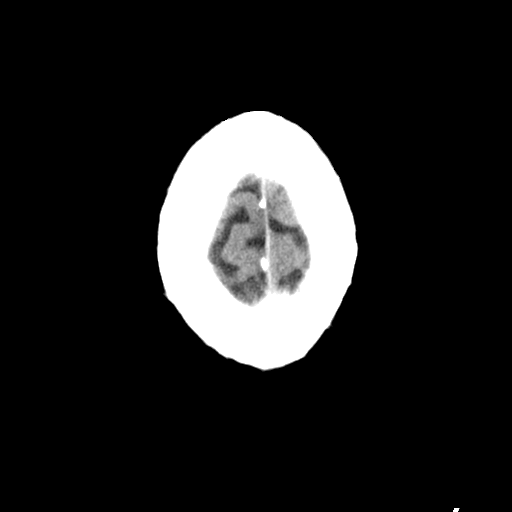
[im 31/38  bone]
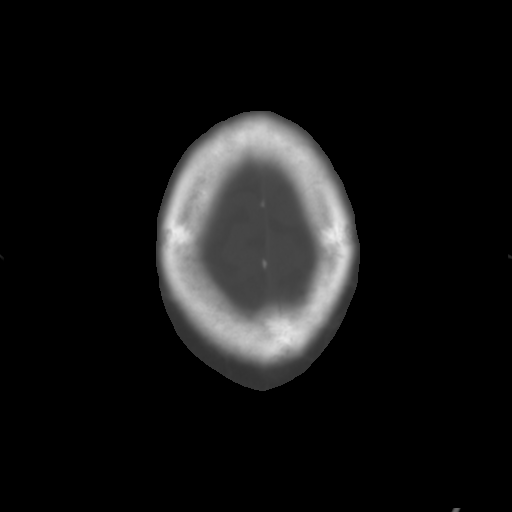
[im 35/38  brain]
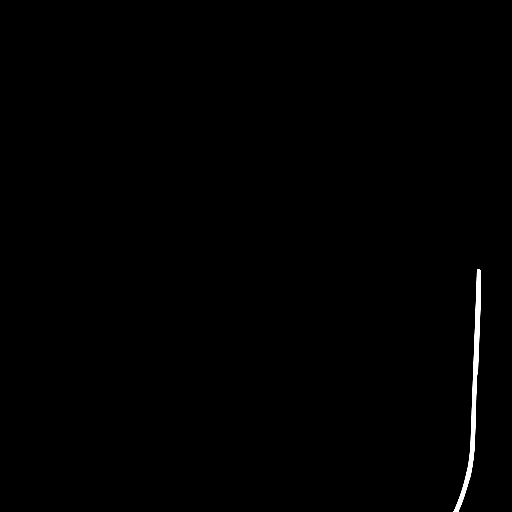

[Series 5: head 3.0 cor st · coronal · 0.36mm/px · 3 of 72 slices shown]
[im 24/72  brain]
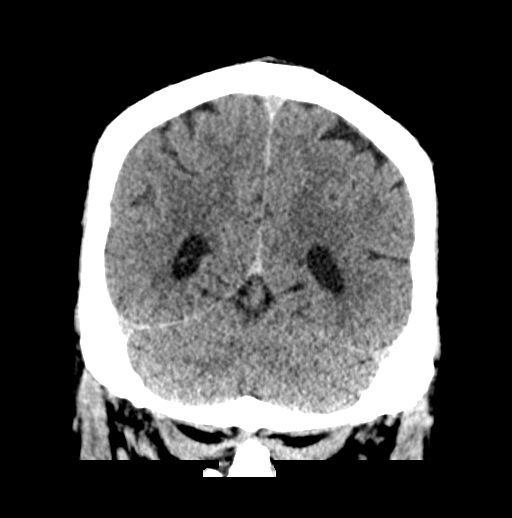
[im 32/72  brain]
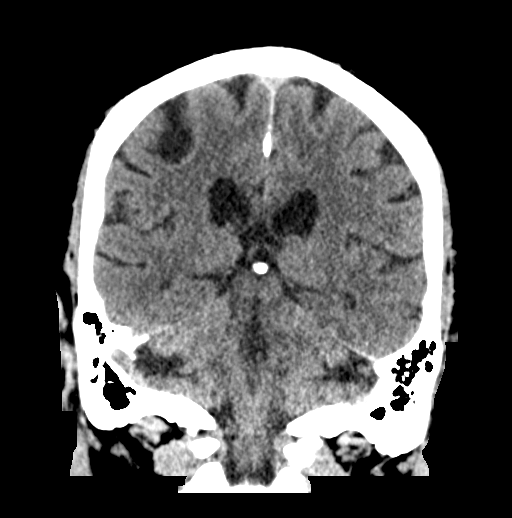
[im 40/72  brain]
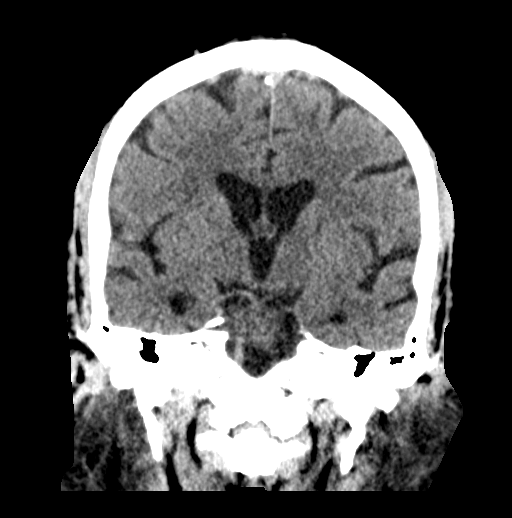

[Series 6: head 3.0 sag st · sagittal · 0.37mm/px · 3 of 61 slices shown]
[im 21/61  brain]
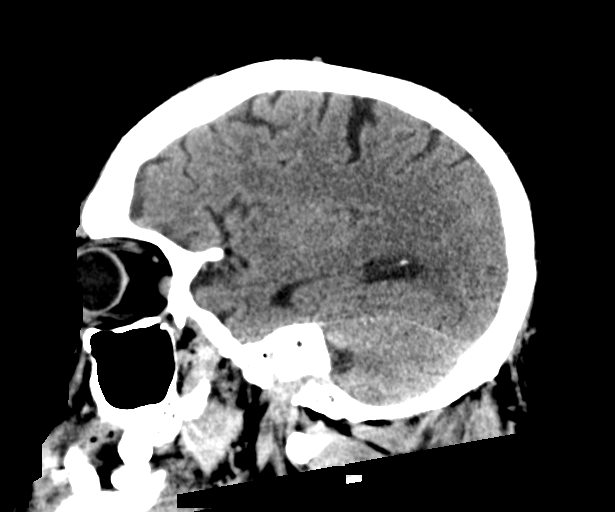
[im 31/61  brain]
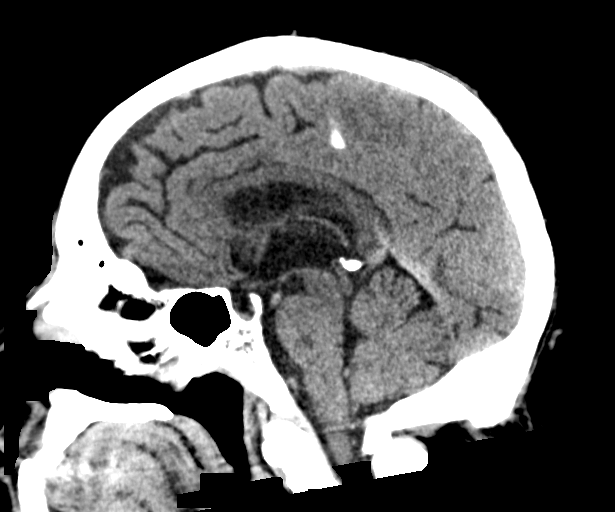
[im 41/61  brain]
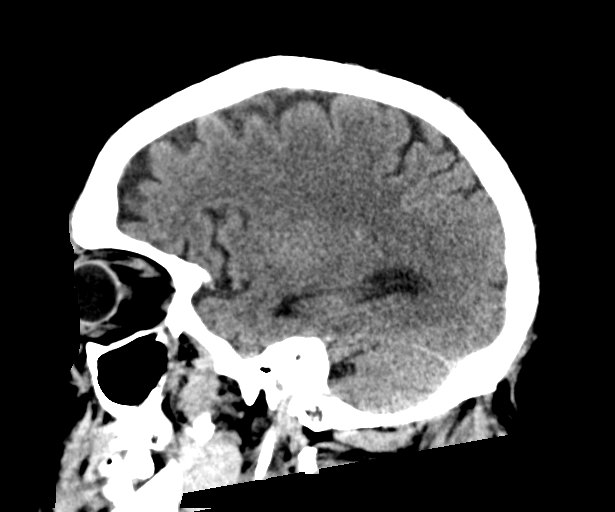

[16 of 47 positions shown; findings below may reference images not displayed]

FINDINGS: Brain: There is no mass, hemorrhage or extra-axial collection. The
size and configuration of the ventricles and extra-axial CSF spaces
are normal. There is hypoattenuation of the periventricular white
matter, most commonly indicating chronic ischemic microangiopathy.

Vascular: No abnormal hyperdensity of the major intracranial
arteries or dural venous sinuses. No intracranial atherosclerosis.

Skull: The visualized skull base, calvarium and extracranial soft
tissues are normal.

Sinuses/Orbits: No fluid levels or advanced mucosal thickening of
the visualized paranasal sinuses. No mastoid or middle ear effusion.
The orbits are normal.

ASPECTS (Alberta Stroke Program Early CT Score)

- Ganglionic level infarction (caudate, lentiform nuclei, internal
capsule, insula, M1-M3 cortex): 7

- Supraganglionic infarction (M4-M6 cortex): 3

Total score (0-10 with 10 being normal): 10
IMPRESSION: 1. No acute hemorrhage.
2. ASPECTS is 10.
* These results were communicated to Dr. Latia Ober at [DATE] on 12/24/2018 by text page via the AMION messaging system.

## 2019-09-27 IMAGING — CT CT CERVICAL SPINE W/O CM
3 series · 12 of 33 positions shown, 14 images · IV contrast (OMNI 350)
Comparison: Concurrent CTA of the neck.

CLINICAL DATA: 62 y/o M; code stroke, left-sided weakness and
facial droop. Head trauma.

EXAM:
CT CERVICAL SPINE WITHOUT CONTRAST
TECHNIQUE: Multidetector CT imaging of the cervical spine was reconstructed
from the CT angiogram of the neck in multiple planes.

[Series 1: cspine axial · axial · 0.31mm/px · z∈[-254,-110]mm · 4 of 104 slices shown, 5 images]
[im 16/104  soft-tissue]
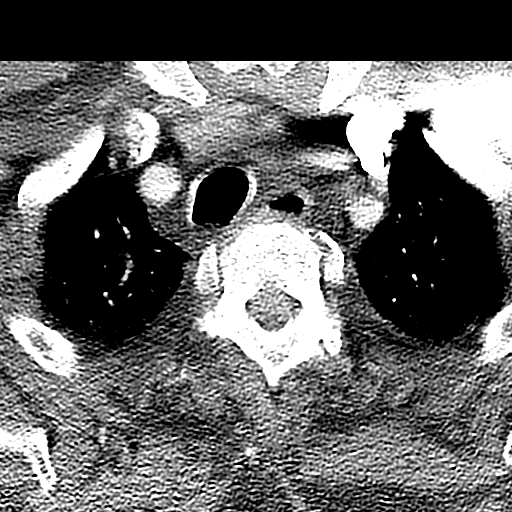
[im 16/104  bone]
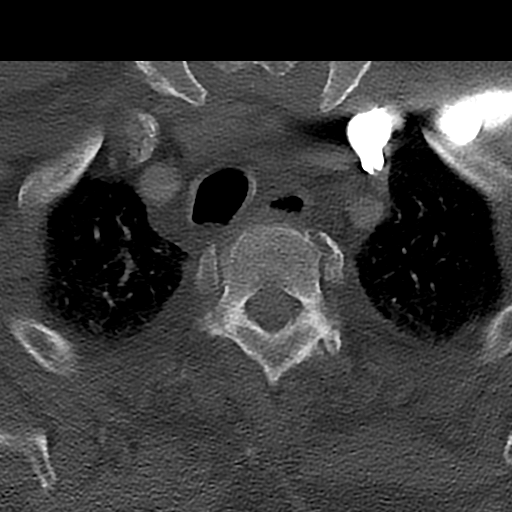
[im 40/104  bone]
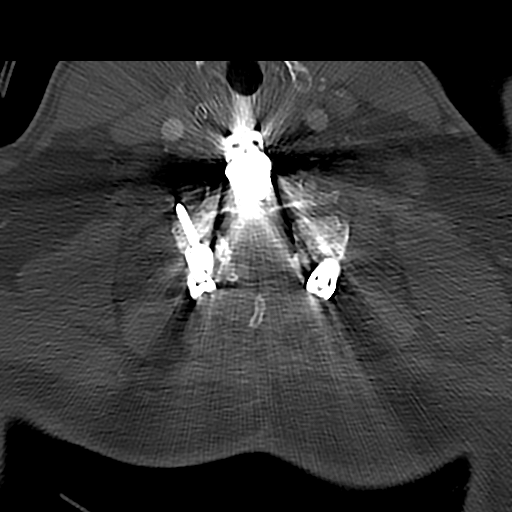
[im 64/104  bone]
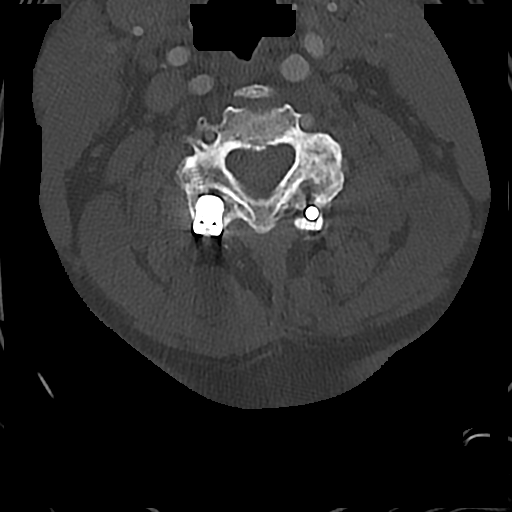
[im 88/104  bone]
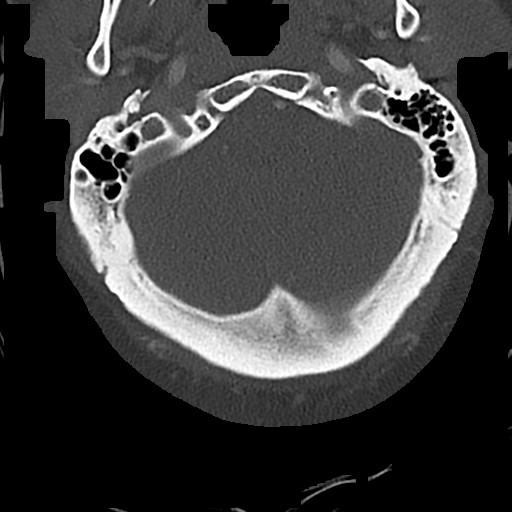

[Series 4: cspine cor · coronal · 0.31mm/px · 3 of 75 slices shown]
[im 15/75  bone]
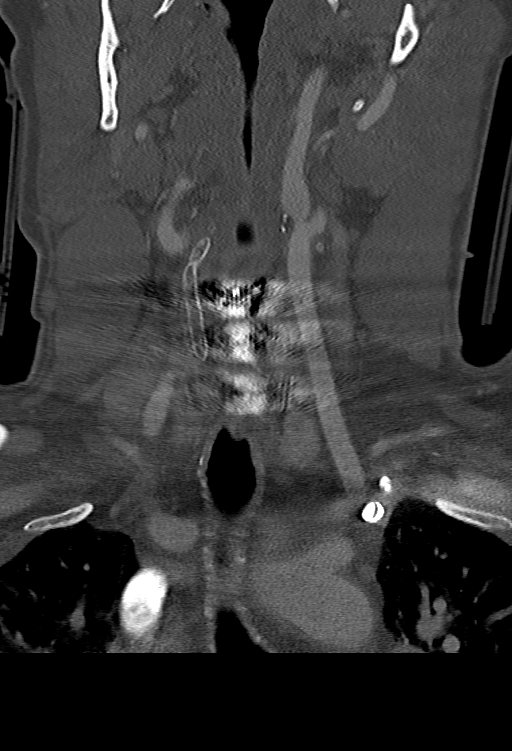
[im 30/75  bone]
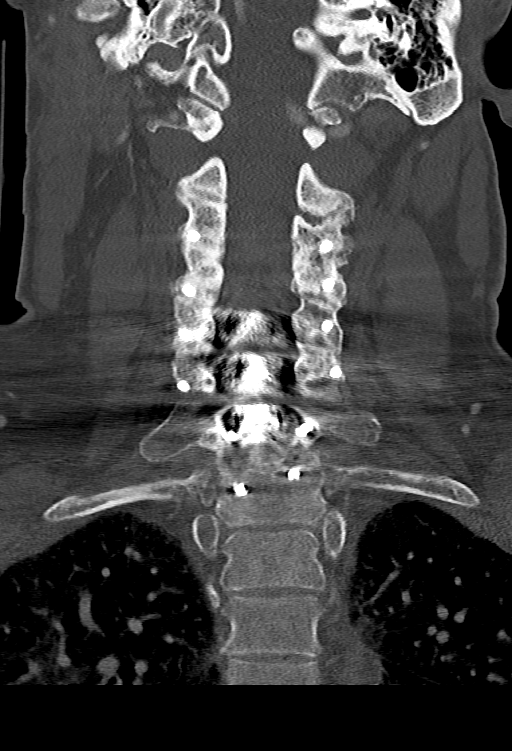
[im 45/75  bone]
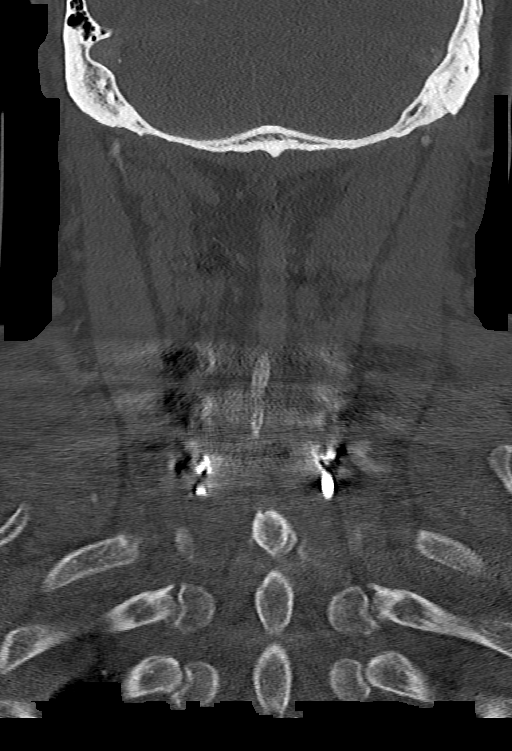

[Series 5: cspinesag · sagittal · 0.26mm/px · 5 of 81 slices shown, 6 images]
[im 27/81  bone]
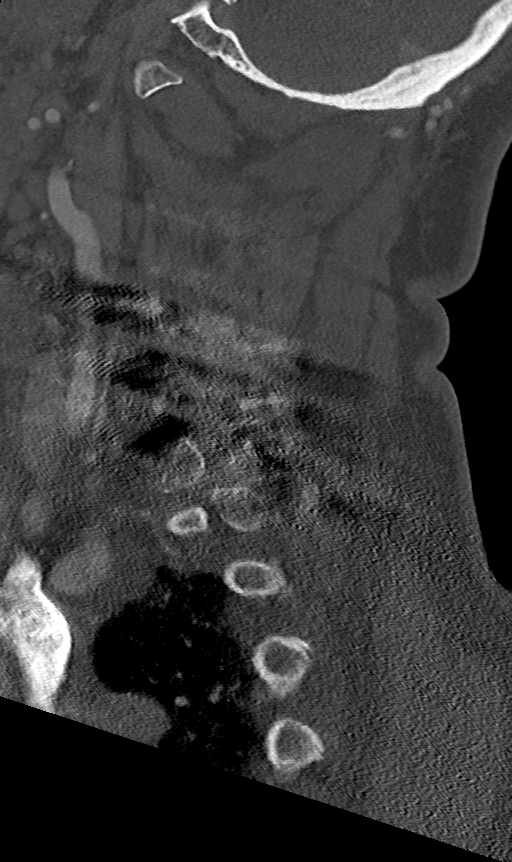
[im 34/81  bone]
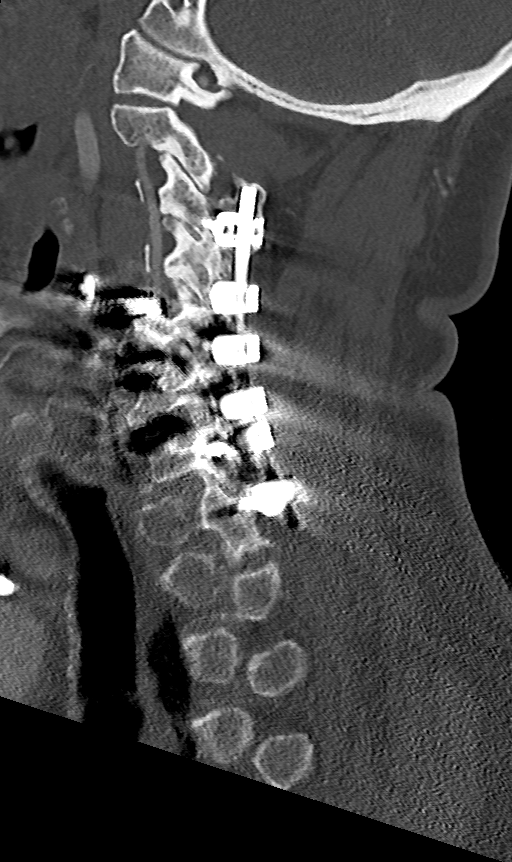
[im 41/81  soft-tissue]
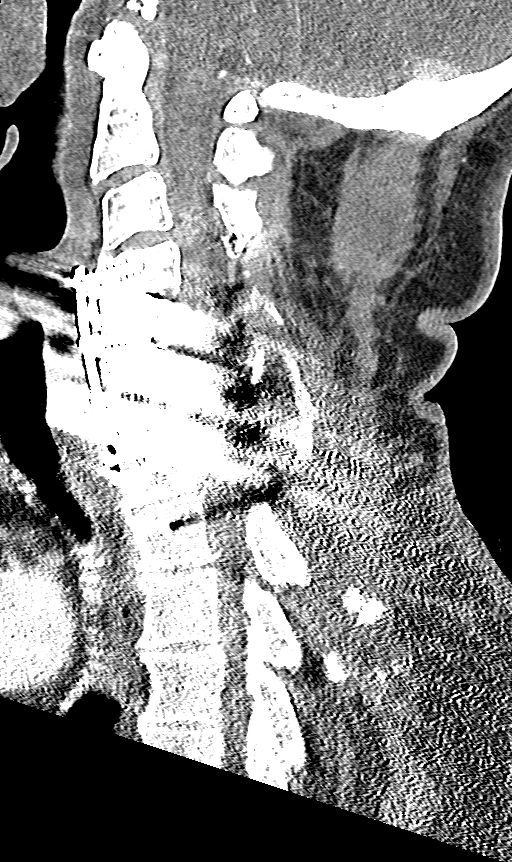
[im 41/81  bone]
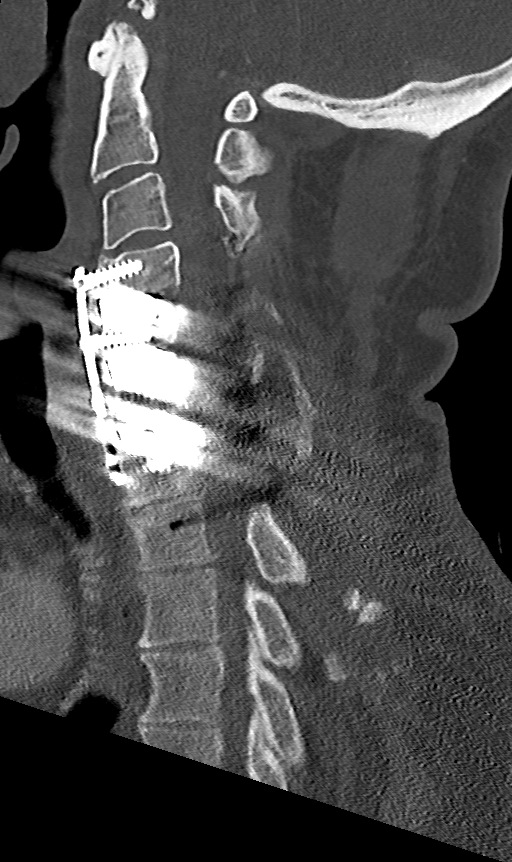
[im 47/81  bone]
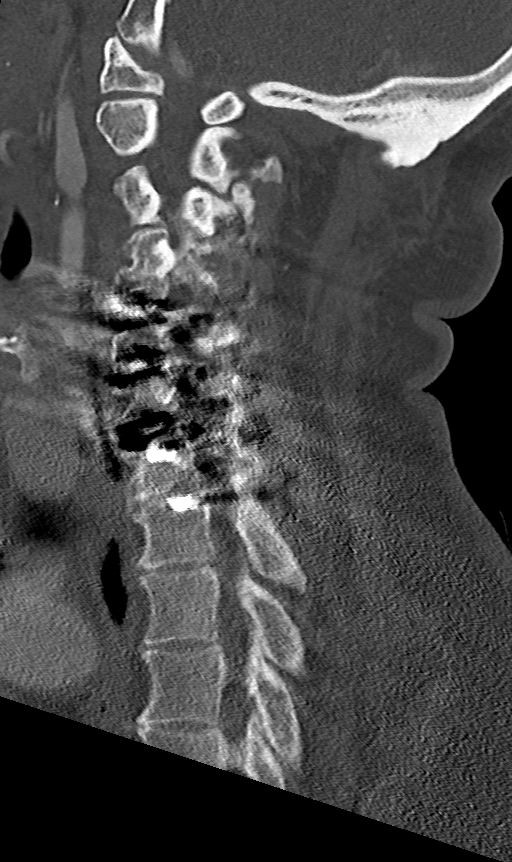
[im 54/81  bone]
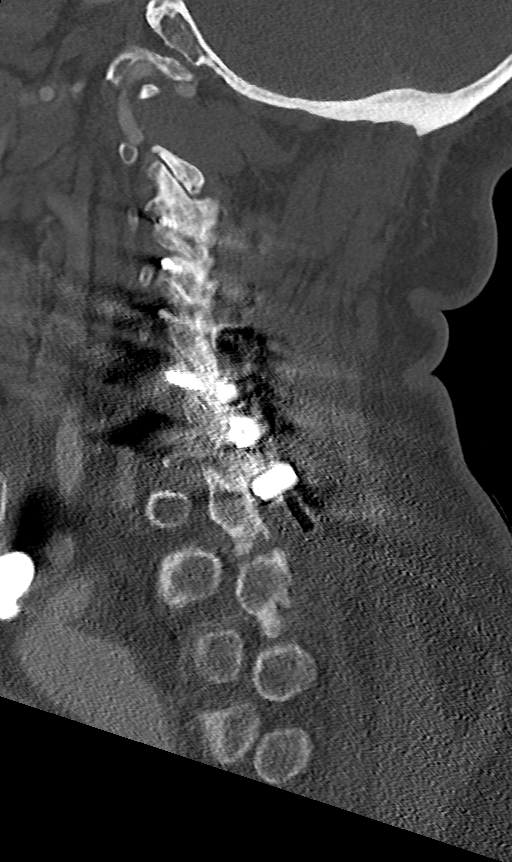

[12 of 33 positions shown; findings below may reference images not displayed]

FINDINGS: Alignment: Straightening of cervical lordosis without listhesis.

Skull base and vertebrae: C4-C7 ACDF and interbody fusion. C3-T1
posterior fusion and laminectomy. Streak artifact from hardware
partially obscures the spine and spinal canal through the levels of
fusion. Hardware appears intact and there is no periprosthetic
lucency or fracture identified. Solid osseous fusion of
posterolateral elements..

Soft tissues and spinal canal: Negative.

Disc levels: Uncovertebral and facet hypertrophy results in bony
neural foraminal stenosis at the left C2-3, bilateral C3-4,
bilateral C4-5, bilateral C5-6, bilateral C6-7 and right C7-T1
levels. The visible spinal canal appears widely patent.

Upper chest: Negative.

Other: Negative.
IMPRESSION: 1. C4-C7 ACDF and C3-T1 posterior fusion with laminectomy. Streak
artifact from hardware partially obscures the spine and spinal canal
through the levels of fusion. No apparent hardware related
complication.
2. No acute fracture or dislocation identified.

## 2019-09-27 MED ORDER — OXYCODONE-ACETAMINOPHEN 5-325 MG PO TABS
1.0000 | ORAL_TABLET | Freq: Three times a day (TID) | ORAL | Status: DC
  Administered 2019-09-27 – 2019-09-29 (×6): 1 via ORAL
  Filled 2019-09-27 (×6): qty 1

## 2019-09-27 MED ORDER — DIVALPROEX SODIUM 500 MG PO DR TAB
500.0000 mg | DELAYED_RELEASE_TABLET | Freq: Every day | ORAL | Status: DC
  Filled 2019-09-27: qty 1

## 2019-09-27 MED ORDER — OXYCODONE HCL 5 MG PO TABS
2.5000 mg | ORAL_TABLET | Freq: Three times a day (TID) | ORAL | Status: DC
  Administered 2019-09-27 – 2019-09-29 (×5): 2.5 mg via ORAL
  Filled 2019-09-27 (×5): qty 1

## 2019-09-27 MED ORDER — DIVALPROEX SODIUM 500 MG PO DR TAB
1000.0000 mg | DELAYED_RELEASE_TABLET | Freq: Every day | ORAL | Status: DC
  Filled 2019-09-27: qty 2

## 2019-09-27 MED ORDER — DIVALPROEX SODIUM 500 MG PO DR TAB
1000.0000 mg | DELAYED_RELEASE_TABLET | Freq: Every day | ORAL | Status: DC
  Administered 2019-09-28 – 2019-09-29 (×2): 1000 mg via ORAL
  Filled 2019-09-27 (×2): qty 2

## 2019-09-27 MED ORDER — DIVALPROEX SODIUM 500 MG PO DR TAB
500.0000 mg | DELAYED_RELEASE_TABLET | Freq: Every day | ORAL | Status: DC
  Administered 2019-09-27 – 2019-09-28 (×2): 500 mg via ORAL
  Filled 2019-09-27 (×3): qty 1

## 2019-09-27 NOTE — ED Notes (Signed)
[facility] called for transport to [facility] 

## 2019-09-27 NOTE — Progress Notes (Signed)
Pharmacy Medication Storage Note  Storing home medications for Russell Carter in pharmacy secured storage.   Medication storage bag number: [id number]  Delivered to pharmacy @ 1510 by Mignon [name]  Medications will be returned to patient/caregiver upon discharge.  Darnelle Bos 09/27/19 3:15 PM

## 2019-09-27 NOTE — Progress Notes (Signed)
PROGRESS NOTE  Russell Carter XEN:[id number] DOB: 11-30-1955 DOA: 09/25/2019 PCP: Marcine Matar, MD  HPI/Recap of past 4 hours: 63 year old male with past medical history significant for coronary artery disease, osteoarthritis, polysubstance abuse, seizure disorder, previous CVA, bipolar disorder anxiety disorder, hyperlipidemia, CKD stage III, who presented to the ED with complaints of shortness of breath, cough and fever of 1 day duration.  Also complained of severe headaches, the worst headache in his life.  Hypoxic in the ED with O2 saturation 88% on room air.  COVID-19 positive.  CT head admission negative for any acute intracranial findings.  Patient admitted for acute COVID-19 viral pneumonia.  09/27/19: Patient seen and examined in the ED.  Reports abdominal pain and nausea without vomiting.  Denies diarrhea.  Has not had a bowel movement since admission.  On remdesivir day 3 out of 5.  Assessment/Plan: Principal Problem:   Pneumonia due to COVID-19 virus Active Problems:   Essential hypertension   Polysubstance abuse (HCC)   CAD-50% LAD 2011   Dyslipidemia   Depression   Diabetes mellitus type 2 in nonobese [facility])   History of pulmonary embolus (PE)   Gastroesophageal reflux disease without esophagitis   Benign prostatic hyperplasia (BPH) with straining on urination   Alcohol use   COVID-19 virus infection  Acute COVID-19 viral pneumonia Positive COVID-19 on 09/25/2019. Independently reviewed chest x-ray done on admission which shows mild patchy pulmonary infiltrate at right lower lobe. Day number 3 out of 5 of remdesivir Continue IV Decadron 6 mg daily to complete 10 days Continue oxygen supplementation to maintain O2 saturation greater than 94% Continue albuterol inhaler Add vitamin C, D3 and zinc Procalcitonin less than 0.10, likely not bacterial, antibiotics not indicated. Trend inflammatory markers  Acute hypoxic respiratory failure secondary to acute COVID-19  viral pneumonia Management as stated above  Acute leukopenia likely secondary to COVID-19 viral infection Drop in WBC this morning down to 2.9. Also lymphocytopenia 0.5 Monitor and repeat CBC in the morning  Resolved severe headache CT head admission negative for any acute intracranial findings Continue symptomatic treatment  History of pulmonary embolism Stable Continue Eliquis  Seizure disorder Stable No seizure event while in the hospital Continue Depakote and Vimpat  Chronic anxiety/depression/bipolar disorder Continue home medications  Type 2 diabetes, well controlled Obtain hemoglobin A1c>> 6.3 on 08/19/2019. Continue insulin coverage  BPH Continue tamsulosin and monitor urine output  Abdominal pain, nausea likely secondary to repeat COVID-19 viral infection Continue symptomatic care  Essential hypertension Blood pressure stable and at goal Continue home medications  DVT prophylaxis: Eliquis Code Status: Full code Family Communication: None Disposition Plan: Transfer to [name] to continue care for acute COVID-19 viral pneumonia.  Objective: Vitals:   09/27/19 0245 09/27/19 0400 09/27/19 0500 09/27/19 0600  BP:  128/80 122/78 118/77  Pulse: 72 74 71 71  Resp: 16 15 16 16   Temp:      TempSrc:      SpO2: 98% 99% 99% 100%  Weight:      Height:        Intake/Output Summary (Last 24 hours) at 09/27/2019 1221 Last data filed at 09/26/2019 2226 Gross per 24 hour  Intake -  Output 5100 ml  Net -5100 ml   Filed Weights   09/25/19 1715  Weight: 95.3 kg    Exam:  . General: 63 y.o. year-old male well developed well nourished in no acute distress.  Alert and oriented x3. . Cardiovascular: Regular rate and rhythm with no rubs or  gallops.  No thyromegaly or JVD noted.   Marland Kitchen Respiratory: Rales at bases no wheezing noted.  Poor inspiratory effort.   . Abdomen: Obese nontender with normal bowel sounds x4 quadrants. . Musculoskeletal: Trace lower extremity edema.   Marland Kitchen Psychiatry: Mood is appropriate for condition and setting   Data Reviewed: CBC: Recent Labs  Lab 09/25/19 2100 09/25/19 2143 09/26/19 0015 09/27/19 0500  WBC 5.8  --  5.0 2.9*  NEUTROABS 4.3  --  3.7 2.0  HGB 14.7 14.6 13.5 13.6  HCT 46.6 43.0 42.9 42.7  MCV 90.0  --  89.9 89.5  PLT 152  --  139* 101*   Basic Metabolic Panel: Recent Labs  Lab 09/25/19 2100 09/25/19 2143 09/26/19 0015 09/27/19 0500  NA 136 134* 136 138  K 4.1 3.7 4.1 4.7  CL 100  --  103 110  CO2 25  --  22 21*  GLUCOSE 101*  --  99 181*  BUN 15  --  14 17  CREATININE 1.50*  --  1.38* 1.03  CALCIUM 8.8*  --  8.4* 7.8*   GFR: Estimated Creatinine Clearance: 85.4 mL/min (by C-G formula based on SCr of 1.03 mg/dL). Liver Function Tests: Recent Labs  Lab 09/25/19 2100 09/26/19 0015 09/27/19 0500  AST 71* 55* 27  ALT 47* 40 30  ALKPHOS 39 38 34*  BILITOT 0.8 0.7 0.6  PROT 6.8 6.1* 5.7*  ALBUMIN 3.8 3.4* 2.8*   No results for input(s): LIPASE, AMYLASE in the last 168 hours. No results for input(s): AMMONIA in the last 168 hours. Coagulation Profile: No results for input(s): INR, PROTIME in the last 168 hours. Cardiac Enzymes: No results for input(s): CKTOTAL, CKMB, CKMBINDEX, TROPONINI in the last 168 hours. BNP (last 3 results) No results for input(s): PROBNP in the last 8760 hours. HbA1C: No results for input(s): HGBA1C in the last 72 hours. CBG: Recent Labs  Lab 09/26/19 0020 09/26/19 0828 09/26/19 1222 09/26/19 1824 09/26/19 2147  GLUCAP 93 172* 129* 127* 138*   Lipid Profile: Recent Labs    09/25/19 2100  TRIG 133   Thyroid Function Tests: No results for input(s): TSH, T4TOTAL, FREET4, T3FREE, THYROIDAB in the last 72 hours. Anemia Panel: Recent Labs    09/26/19 0015 09/27/19 0500  FERRITIN 28 108   Urine analysis:    Component Value Date/Time   COLORURINE STRAW (A) 09/25/2019 2100   APPEARANCEUR CLEAR 09/25/2019 2100   LABSPEC 1.009 09/25/2019 2100   PHURINE  6.0 09/25/2019 2100   GLUCOSEU NEGATIVE 09/25/2019 2100   HGBUR NEGATIVE 09/25/2019 2100   BILIRUBINUR NEGATIVE 09/25/2019 2100   BILIRUBINUR negative 09/08/2018 1640   KETONESUR NEGATIVE 09/25/2019 2100   PROTEINUR NEGATIVE 09/25/2019 2100   UROBILINOGEN 2.0 (A) 09/08/2018 1640   UROBILINOGEN 1.0 02/04/2015 0647   NITRITE NEGATIVE 09/25/2019 2100   LEUKOCYTESUR NEGATIVE 09/25/2019 2100   Sepsis Labs: @LABRCNTIP (procalcitonin:4,lacticidven:4)  ) Recent Results (from the past 240 hour(s))  Urine culture     Status: None   Collection Time: 09/25/19  9:00 PM   Specimen: Urine, Clean Catch  Result Value Ref Range Status   Specimen Description URINE, CLEAN CATCH  Final   Special Requests NONE  Final   Culture   Final    NO GROWTH Performed at [facility], [place] [address], [place], [place] [address]    Report Status 09/27/2019 FINAL  Final  SARS CORONAVIRUS 2 (TAT 6-24 HRS) Nasopharyngeal Nasopharyngeal Swab     Status: Abnormal  Collection Time: 09/25/19  9:38 PM   Specimen: Nasopharyngeal Swab  Result Value Ref Range Status   SARS Coronavirus 2 POSITIVE (A) NEGATIVE Final    Comment: RESULT CALLED TO, READ BACK BY AND VERIFIED WITH: Royden Purl RN [phone] 09/26/2019 T. TYSOR (NOTE) SARS-CoV-2 target nucleic acids are DETECTED. The SARS-CoV-2 RNA is generally detectable in upper and lower respiratory specimens during the acute phase of infection. Positive results are indicative of the presence of SARS-CoV-2 RNA. Clinical correlation with patient history and other diagnostic information is  necessary to determine patient infection status. Positive results do not rule out bacterial infection or co-infection with other viruses.  The expected result is Negative. Fact Sheet for Patients: HairSlick.no Fact Sheet for Healthcare Providers: quierodirigir.com This test is not yet approved or cleared by the [place]  FDA and  has been authorized for detection and/or diagnosis of SARS-CoV-2 by FDA under an Emergency Use Authorization (EUA). This EUA will remain  in effect (meaning this test can be use d) for the duration of the COVID-19 declaration under Section 564(b)(1) of the Act, 21 U.S.C. section 360bbb-3(b)(1), unless the authorization is terminated or revoked sooner. Performed at [facility], [address], [place], [place] [address]   Blood Culture (routine x 2)     Status: None (Preliminary result)   Collection Time: 09/26/19 12:45 AM   Specimen: BLOOD  Result Value Ref Range Status   Specimen Description BLOOD LEFT ARM  Final   Special Requests   Final    BOTTLES DRAWN AEROBIC AND ANAEROBIC Blood Culture adequate volume   Culture   Final    NO GROWTH 1 DAY Performed at [facility], [address] [place], [place] [address]    Report Status PENDING  Incomplete      Studies: No results found.  Scheduled Meds: . albuterol  2 puff Inhalation Q6H  . allopurinol  300 mg Oral Daily  . amLODipine  5 mg Oral Daily  . apixaban  5 mg Oral BID  . aspirin EC  81 mg Oral Daily  . cyclobenzaprine  10 mg Oral TID  . dexamethasone (DECADRON) injection  6 mg Intravenous Q24H  . divalproex  500-1,000 mg Oral See admin instructions  . DULoxetine  30 mg Oral BID  . famotidine  20 mg Oral BID  . folic acid  1 mg Oral Daily  . gabapentin  100 mg Oral BID  . insulin aspart  0-5 Units Subcutaneous QHS  . insulin aspart  0-9 Units Subcutaneous TID WC  . lacosamide  100 mg Oral BID  . losartan  100 mg Oral Daily  . oxyCODONE-acetaminophen  1 tablet Oral TID  . rosuvastatin  40 mg Oral Daily  . tamsulosin  0.4 mg Oral QHS  . thiamine  100 mg Oral Daily    Continuous Infusions: . sodium chloride 125 mL/hr at 09/27/19 0319  . remdesivir 100 mg in NS 100 mL 100 mg (09/27/19 0954)     LOS: 2 days     Darlin Drop, MD [facility] Pager [phone]  If 7PM-7AM,  please contact night-coverage [email or web address] Password [email or web address] 09/27/2019, 12:21 PM

## 2019-09-27 NOTE — Plan of Care (Signed)

## 2019-09-27 NOTE — ED Notes (Signed)
RN attempted blood draw x 2 

## 2019-09-27 NOTE — ED Notes (Signed)
Ate 100% of breakfast

## 2019-09-27 NOTE — ED Notes (Signed)
Patient leaving the department with [facility] in NAD. Stable. Off oxygen with sats remaining above 95% for greater than 1 hour.

## 2019-09-27 NOTE — ED Notes (Signed)
Report to Holly RN

## 2019-09-27 NOTE — Evaluation (Addendum)
Physical Therapy Evaluation Patient Details Name: Russell Carter MRN: [id number] DOB: 1956/06/15 Today's Date: 09/27/2019   History of Present Illness  63 year old male with past medical history significant for coronary artery disease, osteoarthritis, polysubstance abuse, seizure disorder, previous CVA, bipolar disorder anxiety disorder, hyperlipidemia, CKD stage III, who presented to the ED with complaints of shortness of breath, cough and fever of 1 day duration. Patient admitted for acute COVID-19 viral pneumonia.  Clinical Impression  Pt admitted with above. Pt presents with decreased functional mobility secondary to decreased cardiopulmonary endurance. Ambulating 5 feet at a min guard assist level; further distance limited due to fatigue. SpO2 96-98% on RA, BP 131/88. Education provided on activity recommendations and progression. Suspect steady progress and don't anticipate need for PT follow up. Pt independent prior to admission.    Follow Up Recommendations No PT follow up    Equipment Recommendations  None recommended by PT    Recommendations for Other Services       Precautions / Restrictions Precautions Precautions: Fall Restrictions Weight Bearing Restrictions: No      Mobility  Bed Mobility Overal bed mobility: Independent                Transfers Overall transfer level: Needs assistance Equipment used: None Transfers: Sit to/from Stand Sit to Stand: Supervision            Ambulation/Gait Ambulation/Gait assistance: Min guard Gait Distance (Feet): 5 Feet Assistive device: None Gait Pattern/deviations: Step-through pattern;Decreased stride length;Narrow base of support Gait velocity: decreased Gait velocity interpretation: <1.8 ft/sec, indicate of risk for recurrent falls General Gait Details: Pt with decreased step length, slow speed, narrow BOS  Stairs            Wheelchair Mobility    Modified Rankin (Stroke Patients Only)        Balance Overall balance assessment: Mild deficits observed, not formally tested                                           Pertinent Vitals/Pain Pain Assessment: No/denies pain    Home Living Family/patient expects to be discharged to:: Private residence Living Arrangements: Spouse/significant other(fiance) Available Help at Discharge: Family;Available PRN/intermittently Type of Home: House Home Access: Stairs to enter Entrance Stairs-Rails: Psychiatric nurse of Steps: 3 Home Layout: One level Home Equipment: Cane - single point;Walker - 2 wheels Additional Comments: wears glasses    Prior Function Level of Independence: Independent         Comments: Does not work, independent at home     [place]: Right    Extremity/Trunk Assessment   Upper Extremity Assessment Upper Extremity Assessment: Defer to OT evaluation    Lower Extremity Assessment Lower Extremity Assessment: Overall WFL for tasks assessed    Cervical / Trunk Assessment Cervical / Trunk Assessment: Normal  Communication   Communication: No difficulties  Cognition Arousal/Alertness: Awake/alert Behavior During Therapy: WFL for tasks assessed/performed Overall Cognitive Status: Within Functional Limits for tasks assessed                                        General Comments      Exercises     Assessment/Plan    PT Assessment Patient needs continued PT services  PT Problem List  Decreased strength;Decreased activity tolerance;Decreased balance;Decreased mobility       PT Treatment Interventions Gait training;Stair training;Functional mobility training;Therapeutic activities;Therapeutic exercise;Balance training;Patient/family education    PT Goals (Current goals can be found in the Care Plan section)  Acute Rehab PT Goals Patient Stated Goal: enjoys playing basketball, swimming PT Goal Formulation: With  patient Time For Goal Achievement: 10/11/19 Potential to Achieve Goals: Good    Frequency Min 3X/week   Barriers to discharge        Co-evaluation PT/OT/SLP Co-Evaluation/Treatment: Yes Reason for Co-Treatment: To address functional/ADL transfers PT goals addressed during session: Mobility/safety with mobility         AM-PAC PT "6 Clicks" Mobility  Outcome Measure Help needed turning from your back to your side while in a flat bed without using bedrails?: None Help needed moving from lying on your back to sitting on the side of a flat bed without using bedrails?: None Help needed moving to and from a bed to a chair (including a wheelchair)?: None Help needed standing up from a chair using your arms (e.g., wheelchair or bedside chair)?: None Help needed to walk in hospital room?: None Help needed climbing 3-5 steps with a railing? : A Little 6 Click Score: 23    End of Session   Activity Tolerance: Patient tolerated treatment well Patient left: in bed;with call bell/phone within reach Nurse Communication: Mobility status PT Visit Diagnosis: Difficulty in walking, not elsewhere classified (R26.2)    Time: 3785-8850 PT Time Calculation (min) (ACUTE ONLY): 20 min   Charges:   PT Evaluation $PT Eval Moderate Complexity: 1 Mod          Laurina Bustle, PT, DPT Acute Rehabilitation Services Pager [phone] Office [phone]   Vanetta Mulders 09/27/2019, 1:20 PM

## 2019-09-27 NOTE — ED Notes (Signed)
Heart healthy/carb modified lunch tray ordered 

## 2019-09-27 NOTE — ED Notes (Signed)
C+ Tele ordered bfast 

## 2019-09-27 NOTE — ED Notes (Signed)
Pt sitting on side of bed eating lunch.

## 2019-09-27 NOTE — Evaluation (Signed)
Occupational Therapy Evaluation Patient Details Name: Russell Carter MRN: [id number] DOB: January 24, 1956 Today's Date: 09/27/2019    History of Present Illness 63 year old male with past medical history significant for coronary artery disease, osteoarthritis, polysubstance abuse, seizure disorder, previous CVA, bipolar disorder anxiety disorder, hyperlipidemia, CKD stage III, who presented to the ED with complaints of shortness of breath, cough and fever of 1 day duration. Patient admitted for acute COVID-19 viral pneumonia.   Clinical Impression   PTA, pt was living with his fiance and was performing ADLs and simple IADLs. Pt currently performing LB ADLs and mobility at [place] level with noted hesitancy in OOB activity. Pt benefits from increased encouragement for participation in OOB activity. SpO2 >97% on RA, HR 70s, RR 10-20s, and BP 131/88. Provided education on sleep positioning in supine (and sidelying due to back pain) for optimizing breathing and lung function; pt verbalized understanding. Also educated on importance of OOB activity with COVID and pt agreeable to eat sitting up for each meal. Pt would benefit from further acute OT to facilitate safe dc. Recommend dc to home once medically stable per physician.      Follow Up Recommendations  No OT follow up;Supervision - Intermittent    Equipment Recommendations  Tub/shower seat(Reporting multiple falls in shower at home)    Recommendations for Other Services PT consult     Precautions / Restrictions Precautions Precautions: Fall Restrictions Weight Bearing Restrictions: No      Mobility Bed Mobility Overal bed mobility: Independent                Transfers Overall transfer level: Needs assistance Equipment used: None Transfers: Sit to/from Stand Sit to Stand: Min guard         General transfer comment: Min Guard A for initial safety    Balance Overall balance assessment: Mild deficits observed, not  formally tested                                         ADL either performed or assessed with clinical judgement   ADL Overall ADL's : Needs assistance/impaired Eating/Feeding: Independent;Sitting   Grooming: Set up;Sitting   Upper Body Bathing: Set up;Supervision/ safety;Sitting   Lower Body Bathing: Min guard;Sit to/from stand   Upper Body Dressing : Supervision/safety;Set up;Sitting   Lower Body Dressing: Min guard;Sit to/from stand   Toilet Transfer: Min guard;Ambulation(simulated to recliner) Armed forces technical officer Details (indicate cue type and reason): Min Guard A for safety in standing         Functional mobility during ADLs: Min guard General ADL Comments: Pt presenting with decreased activity [place]. Requiring increased encouragement to particiapte in therapy. Educating pt on importance of OOB activity. Pt verablized and agreed to eat meals sitting up. Also educated pt on benefits of sleeping in supine (or sidelying)     Vision Baseline Vision/History: Wears glasses Patient Visual Report: No change from baseline       Perception     Praxis      Pertinent Vitals/Pain Pain Assessment: No/denies pain     Hand Dominance Right   Extremity/Trunk Assessment Upper Extremity Assessment Upper Extremity Assessment: Overall WFL for tasks assessed   Lower Extremity Assessment Lower Extremity Assessment: Defer to PT evaluation   Cervical / Trunk Assessment Cervical / Trunk Assessment: Other exceptions Cervical / Trunk Exceptions: Prior back and cervical sx   Communication Communication Communication: No difficulties  Cognition Arousal/Alertness: Awake/alert Behavior During Therapy: WFL for tasks assessed/performed Overall Cognitive Status: Within Functional Limits for tasks assessed                                     General Comments  SpO2 >97% on RA throughout. HR 70s. RR 20s. BP 131/88    Exercises Exercises: Other  exercises Other Exercises Other Exercises: Educating pt on sleep positioning (supine and sidelying) for optimal lung function   Shoulder Instructions      Home Living Family/patient expects to be discharged to:: Private residence Living Arrangements: Spouse/significant other(fiance) Available Help at Discharge: Family;Available PRN/intermittently Type of Home: House Home Access: Stairs to enter [employer] of Steps: 3 Entrance Stairs-Rails: Right;Left Home Layout: One level     Bathroom Shower/Tub: Chief Strategy Officer: Standard Bathroom Accessibility: No   Home Equipment: Cane - single point;Walker - 2 wheels   Additional Comments: wears glasses      Prior Functioning/Environment Level of Independence: Independent        Comments: Does not work, independent at home        OT Problem List: Decreased strength;Decreased range of motion;Decreased activity tolerance;Impaired balance (sitting and/or standing);Decreased safety awareness;Decreased knowledge of precautions;Decreased knowledge of use of DME or AE      OT Treatment/Interventions: Self-care/ADL training;Therapeutic exercise;Energy conservation;DME and/or AE instruction;Therapeutic activities;Patient/family education    OT Goals(Current goals can be found in the care plan section) Acute Rehab OT Goals Patient Stated Goal: enjoys playing basketball, swimming OT Goal Formulation: With patient Time For Goal Achievement: 10/11/19 Potential to Achieve Goals: Good  OT Frequency: Min 2X/week   Barriers to D/C:            Co-evaluation PT/OT/SLP Co-Evaluation/Treatment: Yes Reason for Co-Treatment: For patient/therapist safety;To address functional/ADL transfers PT goals addressed during session: Mobility/safety with mobility OT goals addressed during session: ADL's and self-care      AM-PAC OT "6 Clicks" Daily Activity     Outcome Measure Help from another person eating meals?:  None Help from another person taking care of personal grooming?: A Little Help from another person toileting, which includes using toliet, bedpan, or urinal?: A Little Help from another person bathing (including washing, rinsing, drying)?: A Little Help from another person to put on and taking off regular upper body clothing?: None Help from another person to put on and taking off regular lower body clothing?: A Little 6 Click Score: 20   End of Session Nurse Communication: Mobility status;Other (comment)(SpO2 good on RA)  Activity Tolerance: Patient tolerated treatment well Patient left: in bed;with call bell/phone within reach  OT Visit Diagnosis: Unsteadiness on feet (R26.81);Other abnormalities of gait and mobility (R26.89);Muscle weakness (generalized) (M62.81)                Time: 6389-3734 OT Time Calculation (min): 23 min Charges:  OT General Charges $OT Visit: 1 Visit OT Evaluation $OT Eval Moderate Complexity: 1 Mod  Shon Indelicato MSOT, OTR/L Acute Rehab Pager: [phone] Office: [phone]  Theodoro Grist Jianni Batten 09/27/2019, 2:52 PM

## 2019-09-28 LAB — TYPE AND SCREEN
ABO/RH(D): B POS
Antibody Screen: NEGATIVE

## 2019-09-28 LAB — CBC WITH DIFFERENTIAL/PLATELET
Abs Immature Granulocytes: 0.03 10*3/uL (ref 0.00–0.07)
Basophils Absolute: 0 10*3/uL (ref 0.0–0.1)
Basophils Relative: 0 %
Eosinophils Absolute: 0 10*3/uL (ref 0.0–0.5)
Eosinophils Relative: 0 %
HCT: 43.8 % (ref 39.0–52.0)
Hemoglobin: 13.8 g/dL (ref 13.0–17.0)
Immature Granulocytes: 1 %
Lymphocytes Relative: 15 %
Lymphs Abs: 0.9 10*3/uL (ref 0.7–4.0)
MCH: 28 pg (ref 26.0–34.0)
MCHC: 31.5 g/dL (ref 30.0–36.0)
MCV: 88.8 fL (ref 80.0–100.0)
Monocytes Absolute: 0.7 10*3/uL (ref 0.1–1.0)
Monocytes Relative: 11 %
Neutro Abs: 4.5 10*3/uL (ref 1.7–7.7)
Neutrophils Relative %: 73 %
Platelets: 206 10*3/uL (ref 150–400)
RBC: 4.93 MIL/uL (ref 4.22–5.81)
RDW: 15.2 % (ref 11.5–15.5)
WBC: 6 10*3/uL (ref 4.0–10.5)
nRBC: 0 % (ref 0.0–0.2)

## 2019-09-28 LAB — GLUCOSE, CAPILLARY
Glucose-Capillary: 129 mg/dL — ABNORMAL HIGH (ref 70–99)
Glucose-Capillary: 150 mg/dL — ABNORMAL HIGH (ref 70–99)
Glucose-Capillary: 145 mg/dL — ABNORMAL HIGH (ref 70–99)
Glucose-Capillary: 154 mg/dL — ABNORMAL HIGH (ref 70–99)

## 2019-09-28 LAB — BRAIN NATRIURETIC PEPTIDE: B Natriuretic Peptide: 17.3 pg/mL (ref 0.0–100.0)

## 2019-09-28 LAB — COMPREHENSIVE METABOLIC PANEL
ALT: 28 U/L (ref 0–44)
AST: 23 U/L (ref 15–41)
Albumin: 3.3 g/dL — ABNORMAL LOW (ref 3.5–5.0)
Alkaline Phosphatase: 39 U/L (ref 38–126)
Anion gap: 10 (ref 5–15)
BUN: 23 mg/dL (ref 8–23)
CO2: 22 mmol/L (ref 22–32)
Calcium: 8.2 mg/dL — ABNORMAL LOW (ref 8.9–10.3)
Chloride: 107 mmol/L (ref 98–111)
Creatinine, Ser: 1.13 mg/dL (ref 0.61–1.24)
GFR calc Af Amer: >60 mL/min (ref >60)
GFR calc non Af Amer: >60 mL/min (ref >60)
Glucose, Bld: 139 mg/dL — ABNORMAL HIGH (ref 70–99)
Potassium: 4.4 mmol/L (ref 3.5–5.1)
Sodium: 139 mmol/L (ref 135–145)
Total Bilirubin: 0.3 mg/dL (ref 0.3–1.2)
Total Protein: 6.6 g/dL (ref 6.5–8.1)

## 2019-09-28 LAB — C-REACTIVE PROTEIN: CRP: 0.8 mg/dL (ref <1.0)

## 2019-09-28 LAB — MAGNESIUM: Magnesium: 2.1 mg/dL (ref 1.7–2.4)

## 2019-09-28 LAB — D-DIMER, QUANTITATIVE: D-Dimer, Quant: <0.27 ug/mL-FEU (ref 0.00–0.50)

## 2019-09-28 IMAGING — MR MR HEAD W/O CM
8 of 10 series · 38 of 48 positions shown · non-contrast
Comparison: Head CT, CTA, and cerebral perfusion imaging
12/24/2018. Brain MRI 05/15/2017.

CLINICAL DATA: Left-sided weakness.

EXAM:
MRI HEAD WITHOUT CONTRAST
TECHNIQUE: Multiplanar, multiecho pulse sequences of the brain and surrounding
structures were obtained without intravenous contrast.

[Series 3: DWI · axial · 3.0mm · 1.09mm/px · z∈[+4,+131]mm · 9 of 88 slices shown (1 of 4)]
[im 1/88]
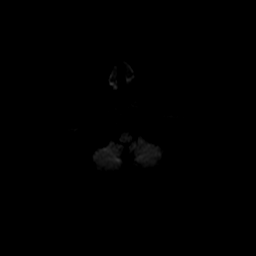
[im 16/88]
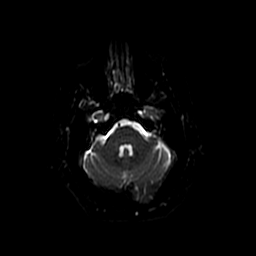
[im 24/88]
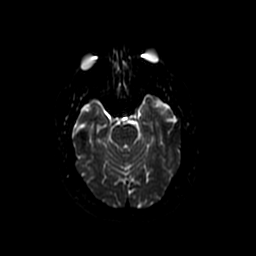
[im 40/88]
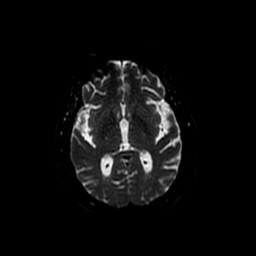
[im 48/88]
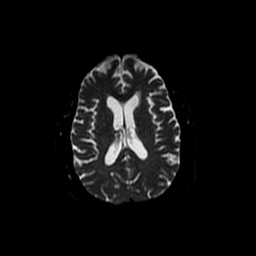
[im 64/88]
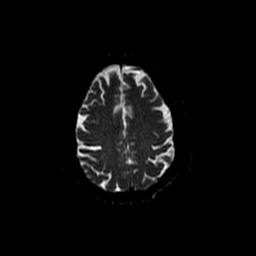
[im 72/88]
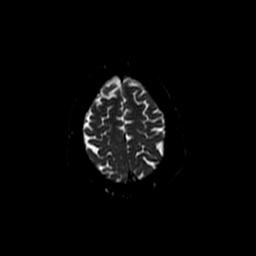
[im 80/88]
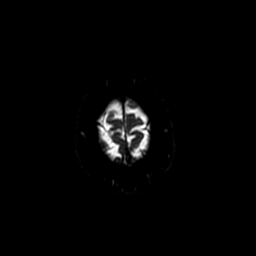
[im 88/88]
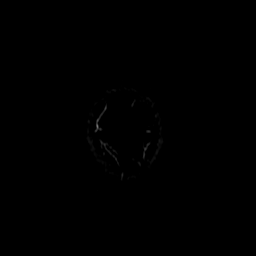

[Series 4: DWI · coronal · 5.0mm · 1.09mm/px · 8 of 64 slices shown (2 of 4)]
[im 1/64]
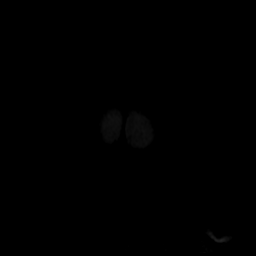
[im 10/64]
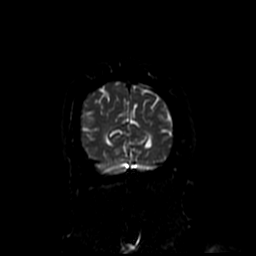
[im 19/64]
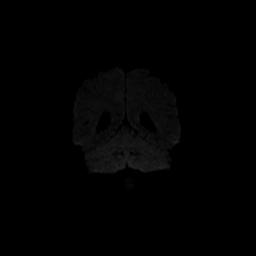
[im 28/64]
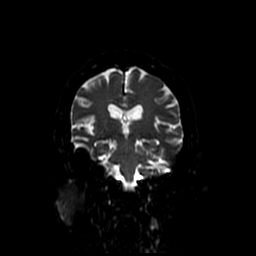
[im 37/64]
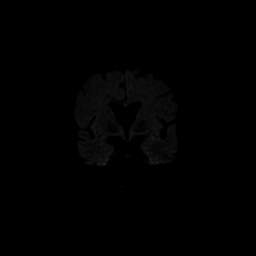
[im 46/64]
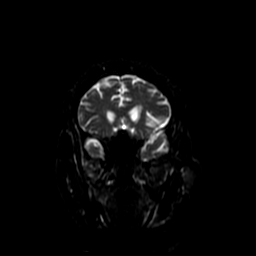
[im 55/64]
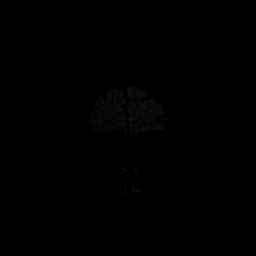
[im 64/64]
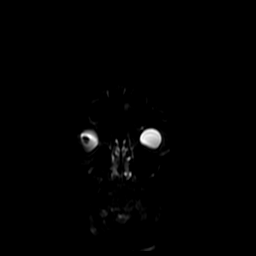

[Series 6: T2 · axial · 5.0mm · 0.43mm/px · z∈[-2,+132]mm · 3 of 24 slices shown (1 of 2)]
[im 1/24]
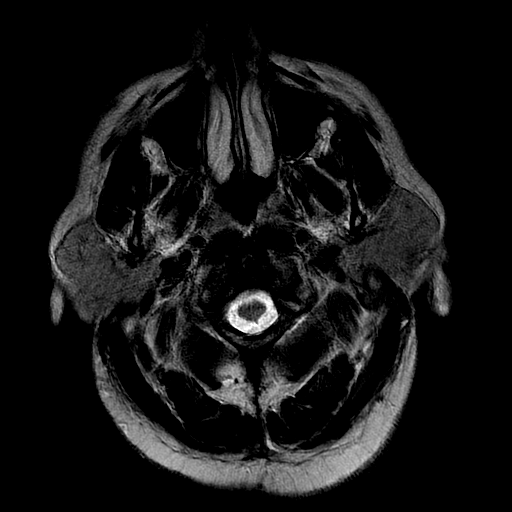
[im 12/24]
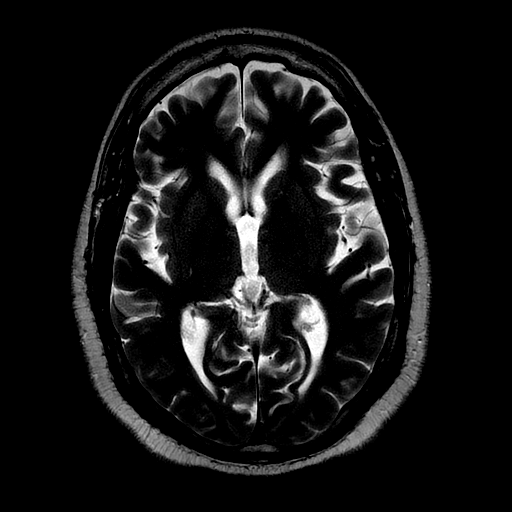
[im 24/24]
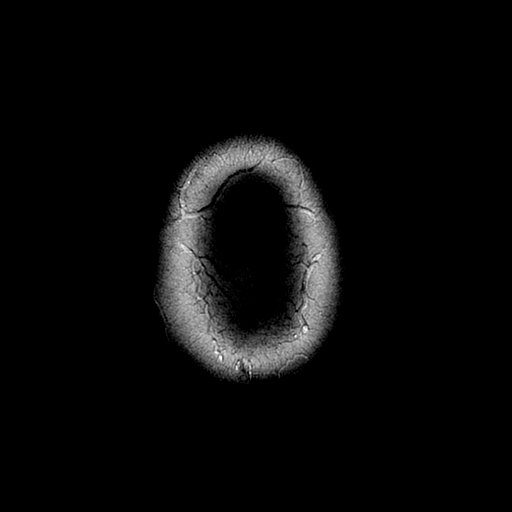

[Series 7: FLAIR · axial · 3.0mm · 0.43mm/px · z∈[-10,+125]mm · 3 of 24 slices shown (1 of 2)]
[im 1/24]
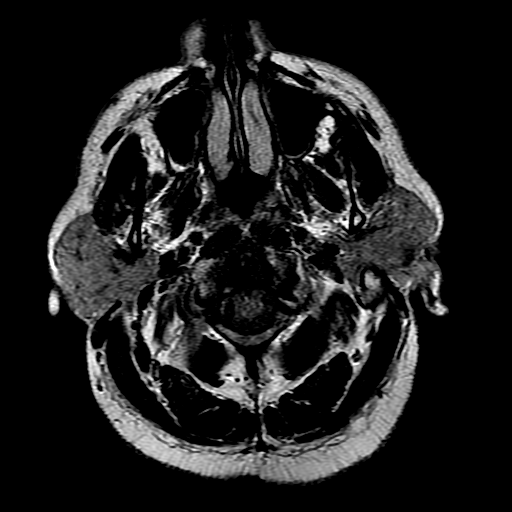
[im 12/24]
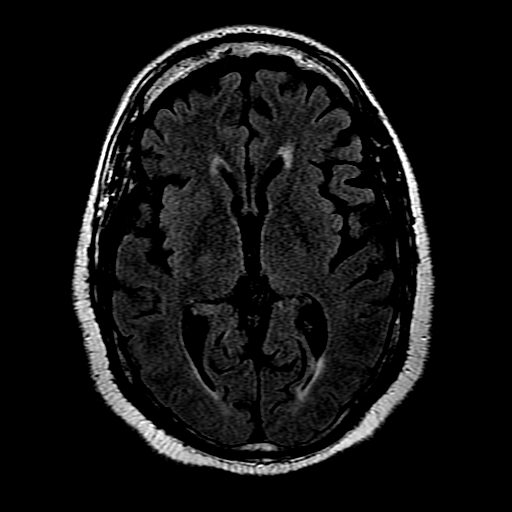
[im 24/24]
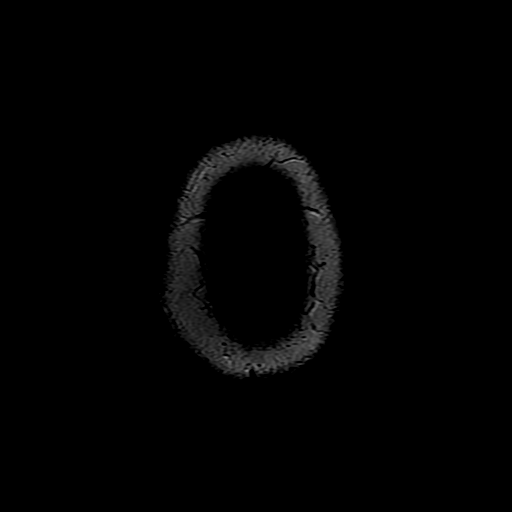

[Series 11: T2 · coronal · 5.0mm · 0.39mm/px · 2 of 25 slices shown (2 of 2)]
[im 1/25]
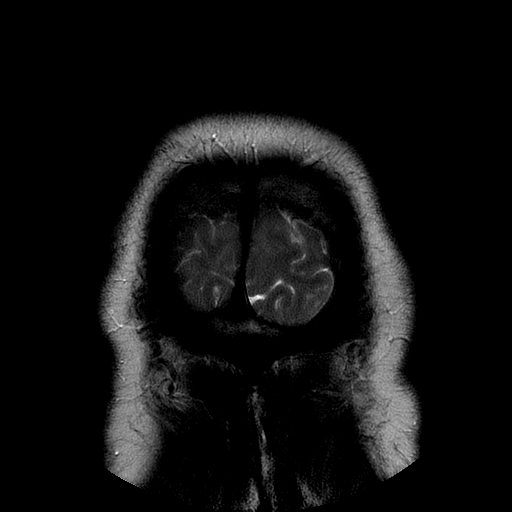
[im 13/25]
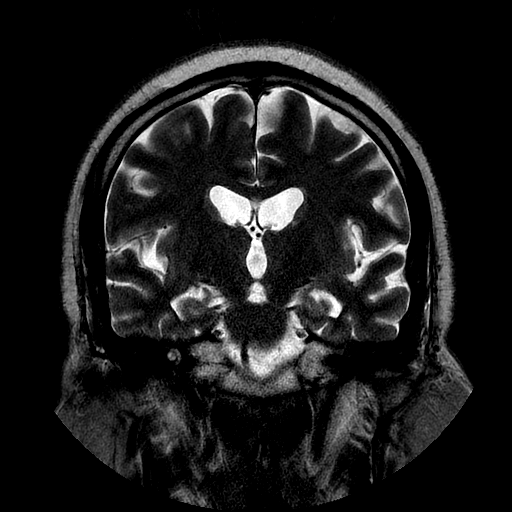

[Series 12: FLAIR · axial · 5.0mm · 0.43mm/px · z∈[-2,+132]mm · 3 of 24 slices shown (2 of 2)]
[im 1/24]
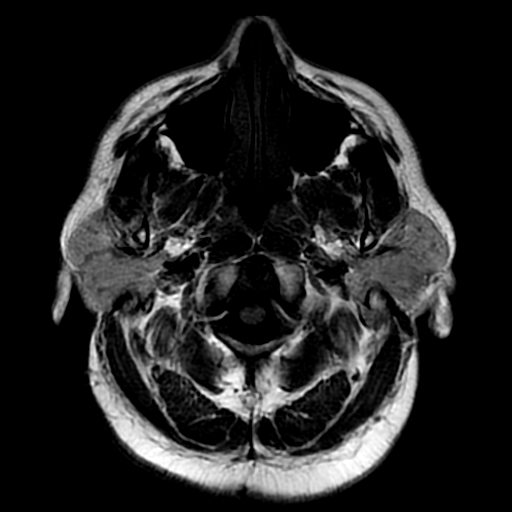
[im 12/24]
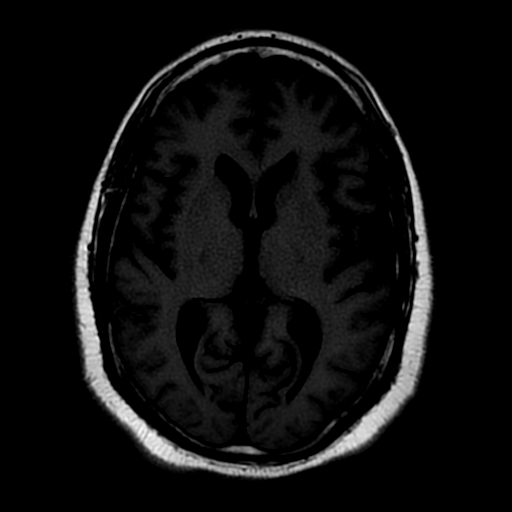
[im 24/24]
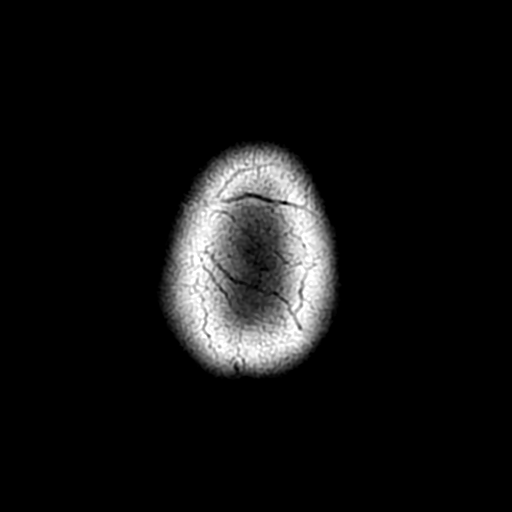

[Series 300: DWI · axial · 3.0mm · 1.09mm/px · z∈[+4,+131]mm · 6 of 44 slices shown (3 of 4)]
[im 1/44]
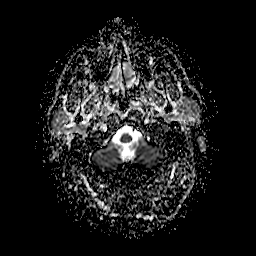
[im 9/44]
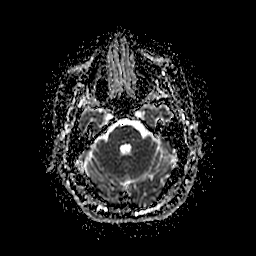
[im 18/44]
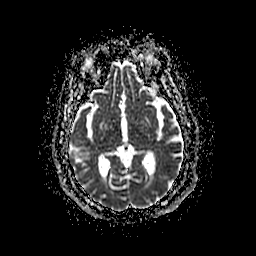
[im 26/44]
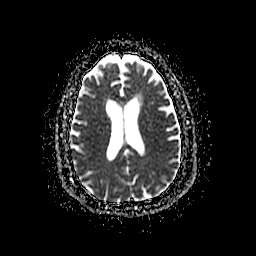
[im 35/44]
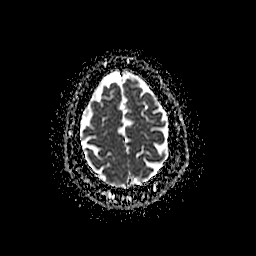
[im 44/44]
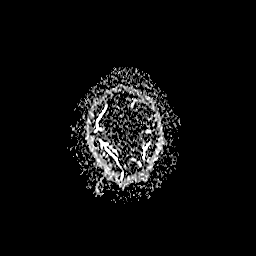

[Series 400: DWI · coronal · 5.0mm · 1.09mm/px · 4 of 32 slices shown (4 of 4)]
[im 1/32]
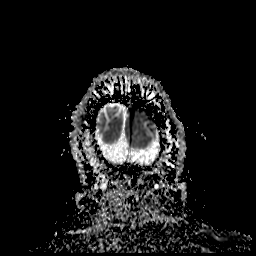
[im 11/32]
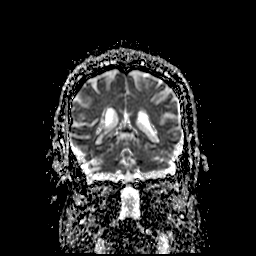
[im 21/32]
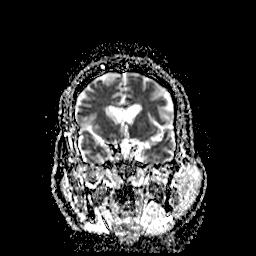
[im 32/32]
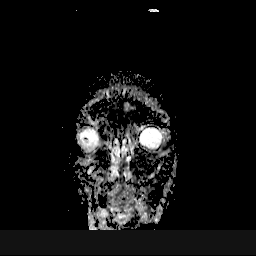

[38 of 48 positions shown; findings below may reference images not displayed]

FINDINGS: Some sequences are mildly motion degraded.

Brain: There is no evidence of acute infarct, intracranial
hemorrhage, mass, midline shift, or extra-axial fluid collection.
Mild cerebral atrophy is not greater than expected for age. Cerebral
white matter T2 hyperintensities, most notable in the
periventricular regions, are slightly more prominent than on the
prior MRI and are nonspecific but compatible with mild chronic small
vessel ischemic disease.

Vascular: Poorly visualized distal right vertebral artery, more
fully evaluated on recent CTA. Other major intracranial vascular
flow voids are preserved.

Skull and upper cervical spine: Unremarkable bone marrow signal.
Anterior and posterior fusion in the cervical spine.

Sinuses/Orbits: Unremarkable orbits. Paranasal sinuses and mastoid
air cells are clear.

Other: None.
IMPRESSION: 1. No acute intracranial abnormality.
2. Mild chronic small vessel ischemic disease.

## 2019-09-28 MED ORDER — HYDROXYZINE HCL 25 MG PO TABS
25.0000 mg | ORAL_TABLET | Freq: Four times a day (QID) | ORAL | Status: DC | PRN
  Administered 2019-09-28: 25 mg via ORAL
  Filled 2019-09-28 (×2): qty 1

## 2019-09-28 MED ORDER — DEXAMETHASONE SODIUM PHOSPHATE 4 MG/ML IJ SOLN
3.0000 mg | INTRAMUSCULAR | Status: DC
  Administered 2019-09-29: 3 mg via INTRAVENOUS
  Filled 2019-09-28: qty 1

## 2019-09-28 NOTE — Progress Notes (Signed)
PROGRESS NOTE                                                                                                                                                                                                             Patient Demographics:    Russell Carter, is a 63 y.o. male, DOB - 11/10/1955, TDS:[id number]  Outpatient Primary MD for the patient is Ladell Pier, MD    LOS - 3  Admit date - 09/25/2019    Chief Complaint  Patient presents with  . Covid/ fever       Brief Narrative  63 year old male with past medical history significant for coronary artery disease, osteoarthritis, polysubstance abuse, seizure disorder, previous CVA, bipolar disorder anxiety disorder, hyperlipidemia, CKD stage III, who presented to the ED with complaints of shortness of breath, cough and fever of 1 day duration, he was diagnosed with Covid pneumonia and admitted to the hospital.   Subjective:    Russell Carter today has, No headache, No chest pain, No abdominal pain - No Nausea, No new weakness tingling or numbness, No Cough - SOB.    Assessment  & Plan :     1. Acute Covid 19 Viral Pneumonitis during the ongoing 2020 Covid 19 Pandemic - he mild disease, treated with IV steroids and remdesivir, close to baseline.  Advance activity, taper down steroids, discharge home once he finishes his remdesivir course.  Encouraged the patient to sit up in chair in the daytime use I-S and flutter valve for pulmonary toiletry and then prone in bed when at night.   SpO2: 97 % O2 Flow Rate (L/min): 1 L/min  Recent Labs  Lab 09/25/19 2100 09/25/19 2138 09/26/19 0015 09/27/19 0500 09/28/19 0117  CRP 1.7*  --  2.4* 0.8 0.8  DDIMER 0.44  --   --   --  <0.27  FERRITIN 26  --  28 108  --   BNP 30.5  --   --   --  17.3  PROCALCITON <0.10  --   --   --   --   SARSCOV2NAA  --  POSITIVE*  --   --   --     Hepatic Function Latest Ref Rng & Units  09/28/2019 09/27/2019 09/26/2019  Total Protein 6.5 - 8.1 g/dL 6.6 5.7(L) 6.1(L)  Albumin 3.5 - 5.0 g/dL 3.3(L)  2.8(L) 3.4(L)  AST 15 - 41 U/L 23 27 55(H)  ALT 0 - 44 U/L 28 30 40  Alk Phosphatase 38 - 126 U/L 39 34(L) 38  Total Bilirubin 0.3 - 1.2 mg/dL 0.3 0.6 0.7  Bilirubin, Direct 0.0 - 0.2 mg/dL - - -       ABG     Component Value Date/Time   PHART 7.388 09/25/2019 2143   PCO2ART 39.2 09/25/2019 2143   PO2ART 102.0 09/25/2019 2143   HCO3 23.3 09/25/2019 2143   TCO2 24 09/25/2019 2143   ACIDBASEDEF 1.0 09/25/2019 2143   O2SAT 97.0 09/25/2019 2143     2.  Nonspecific headache.  Negative CT.  Headache is resolved.  3.  History of chronic back pain with polysubstance abuse and narcotic abuse on a chronic basis.  No acute issues supportive care.  Patient was found to have multiple pills in his pocket when he arrived in the hospital.  Being observed closely.  4.  Seizure disorder.  Stable on combination of Depakote and Vimpat.  5.  Leukopenia related to viral illness.  Improving.  6.  BPH.  On Flomax.  7.  Essential hypertension.  Stable currently off medications.  8.  DM type II.  Currently on sliding scale.  Stable outpatient control.  Lab Results  Component Value Date   HGBA1C 6.3 (H) 08/19/2019        Condition - Fair  Family Communication  :  None  Code Status :  Full  Diet :   Diet Order            Diet heart healthy/carb modified Room service appropriate? Yes; Fluid consistency: Thin  Diet effective now               Disposition Plan  :  Home  Consults  :  None  Procedures  :    CT head.  Nonacute.  PUD Prophylaxis : Pepcid  DVT Prophylaxis  : Eliquis  Lab Results  Component Value Date   PLT 206 09/28/2019    Inpatient Medications  Scheduled Meds: . albuterol  2 puff Inhalation Q6H  . allopurinol  300 mg Oral Daily  . amLODipine  5 mg Oral Daily  . apixaban  5 mg Oral BID  . aspirin EC  81 mg Oral Daily  . cyclobenzaprine   10 mg Oral TID  . [START ON 09/29/2019] dexamethasone (DECADRON) injection  3 mg Intravenous Q24H  . divalproex  1,000 mg Oral Daily   And  . divalproex  500 mg Oral QHS  . DULoxetine  30 mg Oral BID  . famotidine  20 mg Oral BID  . folic acid  1 mg Oral Daily  . gabapentin  100 mg Oral BID  . insulin aspart  0-5 Units Subcutaneous QHS  . insulin aspart  0-9 Units Subcutaneous TID WC  . lacosamide  100 mg Oral BID  . losartan  100 mg Oral Daily  . oxyCODONE-acetaminophen  1 tablet Oral TID   And  . oxyCODONE  2.5 mg Oral TID  . rosuvastatin  40 mg Oral Daily  . tamsulosin  0.4 mg Oral QHS  . thiamine  100 mg Oral Daily   Continuous Infusions: . remdesivir 100 mg in NS 100 mL 100 mg (09/28/19 0812)   PRN Meds:.acetaminophen, hydrOXYzine, nitroGLYCERIN, ondansetron **OR** ondansetron (ZOFRAN) IV  Antibiotics  :    Anti-infectives (From admission, onward)   Start     Dose/Rate Route Frequency Ordered Stop  09/27/19 1000  remdesivir 100 mg in sodium chloride 0.9 % 100 mL IVPB     100 mg 200 mL/hr over 30 Minutes Intravenous Daily 09/25/19 2330 10/01/19 0959   09/26/19 1000  remdesivir 100 mg in sodium chloride 0.9 % 100 mL IVPB  Status:  Discontinued     100 mg 200 mL/hr over 30 Minutes Intravenous Daily 09/26/19 0024 09/26/19 0030   09/26/19 0100  remdesivir 200 mg in sodium chloride 0.9% 250 mL IVPB     200 mg 580 mL/hr over 30 Minutes Intravenous Once 09/25/19 2330 09/26/19 0054   09/26/19 0030  remdesivir 200 mg in sodium chloride 0.9% 250 mL IVPB  Status:  Discontinued     200 mg 580 mL/hr over 30 Minutes Intravenous Once 09/26/19 0024 09/26/19 0030   09/26/19 0030  azithromycin (ZITHROMAX) tablet 500 mg     500 mg Oral  Once 09/26/19 0024 09/26/19 0107   09/25/19 1800  vancomycin (VANCOCIN) 2,000 mg in sodium chloride 0.9 % 500 mL IVPB  Status:  Discontinued     2,000 mg 250 mL/hr over 120 Minutes Intravenous  Once 09/25/19 1753 09/25/19 2230   09/25/19 1800   piperacillin-tazobactam (ZOSYN) IVPB 3.375 g  Status:  Discontinued     3.375 g 100 mL/hr over 30 Minutes Intravenous  Once 09/25/19 1753 09/25/19 2230       Time Spent in minutes  30   Lala Lund M.D on 09/28/2019 at 11:48 AM  To page go to [email or web address] - password [facility]  [facility] -  Office  [phone]   See all Orders from today for further details    Objective:   Vitals:   09/27/19 2335 09/28/19 0346 09/28/19 0731 09/28/19 0800  BP:  136/89 (!) 134/104   Pulse: 79 65 65 75  Resp:  '14 15 16  '$ Temp:  97.6 F (36.4 C) 97.6 F (36.4 C)   TempSrc:  Oral Oral   SpO2: 96% 97% 99% 97%  Weight:      Height:        Wt Readings from Last 3 Encounters:  09/25/19 95.3 kg  09/07/19 94.8 kg  08/19/19 91.2 kg     Intake/Output Summary (Last 24 hours) at 09/28/2019 1148 Last data filed at 09/28/2019 0731 Gross per 24 hour  Intake 1720 ml  Output 1600 ml  Net 120 ml     Physical Exam  Awake Alert,  No new F.N deficits, Normal affect [place].AT,PERRAL Supple Neck,No JVD, No cervical lymphadenopathy appriciated.  Symmetrical Chest wall movement, Good air movement bilaterally, CTAB RRR,No Gallops,Rubs or new Murmurs, No Parasternal Heave +ve B.Sounds, Abd Soft, No tenderness, No organomegaly appriciated, No rebound - guarding or rigidity. No Cyanosis, Clubbing or edema, No new Rash or bruise     Data Review:    CBC Recent Labs  Lab 09/25/19 2100 09/25/19 2143 09/26/19 0015 09/27/19 0500 09/28/19 0117  WBC 5.8  --  5.0 2.9* 6.0  HGB 14.7 14.6 13.5 13.6 13.8  HCT 46.6 43.0 42.9 42.7 43.8  PLT 152  --  139* 143* 206  MCV 90.0  --  89.9 89.5 88.8  MCH 28.4  --  28.3 28.5 28.0  MCHC 31.5  --  31.5 31.9 31.5  RDW 15.3  --  15.3 15.1 15.2  LYMPHSABS 0.6*  --  0.5* 0.5* 0.9  MONOABS 1.0  --  0.7 0.4 0.7  EOSABS 0.0  --  0.0 0.0 0.0  BASOSABS 0.0  --  0.0  0.0 0.0    Chemistries  Recent Labs  Lab 09/25/19 2100 09/25/19 2143 09/26/19 0015 09/27/19  0500 09/28/19 0117  NA 136 134* 136 138 139  K 4.1 3.7 4.1 4.7 4.4  CL 100  --  103 110 107  CO2 25  --  22 21* 22  GLUCOSE 101*  --  99 181* 139*  BUN 15  --  '14 17 23  '$ CREATININE 1.50*  --  1.38* 1.03 1.13  CALCIUM 8.8*  --  8.4* 7.8* 8.2*  MG  --   --   --   --  2.1  AST 71*  --  55* 27 23  ALT 47*  --  40 30 28  ALKPHOS 39  --  38 34* 39  BILITOT 0.8  --  0.7 0.6 0.3   ------------------------------------------------------------------------------------------------------------------ Recent Labs    09/25/19 2100  TRIG 133    Lab Results  Component Value Date   HGBA1C 6.3 (H) 08/19/2019   ------------------------------------------------------------------------------------------------------------------ No results for input(s): TSH, T4TOTAL, T3FREE, THYROIDAB in the last 72 hours.  Invalid input(s): FREET3  Cardiac Enzymes No results for input(s): CKMB, TROPONINI, MYOGLOBIN in the last 168 hours.  Invalid input(s): CK ------------------------------------------------------------------------------------------------------------------    Component Value Date/Time   BNP 17.3 09/28/2019 0117    Micro Results Recent Results (from the past 240 hour(s))  Urine culture     Status: None   Collection Time: 09/25/19  9:00 PM   Specimen: Urine, Clean Catch  Result Value Ref Range Status   Specimen Description URINE, CLEAN CATCH  Final   Special Requests NONE  Final   Culture   Final    NO GROWTH Performed at [facility], [address] [place], [place] [address]    Report Status 09/27/2019 FINAL  Final  SARS CORONAVIRUS 2 (TAT 6-24 HRS) Nasopharyngeal Nasopharyngeal Swab     Status: Abnormal   Collection Time: 09/25/19  9:38 PM   Specimen: Nasopharyngeal Swab  Result Value Ref Range Status   SARS Coronavirus 2 POSITIVE (A) NEGATIVE Final    Comment: RESULT CALLED TO, READ BACK BY AND VERIFIED WITHLennox Solders RN [phone] 09/26/2019 T. TYSOR (NOTE)  SARS-CoV-2 target nucleic acids are DETECTED. The SARS-CoV-2 RNA is generally detectable in upper and lower respiratory specimens during the acute phase of infection. Positive results are indicative of the presence of SARS-CoV-2 RNA. Clinical correlation with patient history and other diagnostic information is  necessary to determine patient infection status. Positive results do not rule out bacterial infection or co-infection with other viruses.  The expected result is Negative. Fact Sheet for Patients: SugarRoll.be Fact Sheet for Healthcare Providers: https://www.woods-mathews.com/ This test is not yet approved or cleared by the [place] FDA and  has been authorized for detection and/or diagnosis of SARS-CoV-2 by FDA under an Emergency Use Authorization (EUA). This EUA will remain  in effect (meaning this test can be use d) for the duration of the COVID-19 declaration under Section 564(b)(1) of the Act, 21 U.S.C. section 360bbb-3(b)(1), unless the authorization is terminated or revoked sooner. Performed at [facility], [place] [address] [place], [place] [address]   Blood Culture (routine x 2)     Status: None (Preliminary result)   Collection Time: 09/26/19 12:45 AM   Specimen: BLOOD  Result Value Ref Range Status   Specimen Description BLOOD LEFT ARM  Final   Special Requests   Final    BOTTLES DRAWN AEROBIC AND ANAEROBIC Blood Culture  adequate volume   Culture   Final    NO GROWTH 1 DAY Performed at [facility], [place] [address], [place], [place] [address]    Report Status PENDING  Incomplete    Radiology Reports Ct Head Wo Contrast  Result Date: 09/25/2019 CLINICAL DATA:  Worst headache of life. EXAM: CT HEAD WITHOUT CONTRAST TECHNIQUE: Contiguous axial images were obtained from the base of the skull through the vertex without intravenous contrast. COMPARISON:  August 19, 2019 FINDINGS: Brain: No subdural, epidural,  or subarachnoid hemorrhage. Ventricles and sulci are stable. No acute cortical ischemia or infarct. No mass effect or midline shift. Cerebellum, brainstem, and basal cisterns are normal. Vascular: No hyperdense vessel or unexpected calcification. Skull: Normal. Negative for fracture or focal lesion. Sinuses/Orbits: No acute finding. Other: None. IMPRESSION: No cause for headache identified. No acute intracranial abnormalities. Electronically Signed   By: Dorise Bullion III M.D   On: 09/25/2019 19:39   Dg Chest Port 1 View  Result Date: 09/25/2019 CLINICAL DATA:  Shortness of breath, headache, weakness, cough, body aches, fever, history CHF, COPD, asthma EXAM: PORTABLE CHEST 1 VIEW COMPARISON:  Portable exam 1730 hours compared to 08/19/2019 FINDINGS: Enlargement of cardiac silhouette. Mediastinal contours and pulmonary vascularity normal. Elevation of LEFT diaphragm. Bibasilar atelectasis. No definite infiltrate, pleural effusion or pneumothorax. Gaseous distention of stomach. Prior cervical fusion. IMPRESSION: Low lung volumes with bibasilar atelectasis. Gaseous distention of stomach. Electronically Signed   By: Lavonia Dana M.D.   On: 09/25/2019 18:23

## 2019-09-29 DIAGNOSIS — J189 Pneumonia, unspecified organism: Secondary | ICD-10-CM | POA: Diagnosis not present

## 2019-09-29 DIAGNOSIS — G4089 Other seizures: Secondary | ICD-10-CM | POA: Diagnosis not present

## 2019-09-29 DIAGNOSIS — R279 Unspecified lack of coordination: Secondary | ICD-10-CM | POA: Diagnosis not present

## 2019-09-29 DIAGNOSIS — M1990 Unspecified osteoarthritis, unspecified site: Secondary | ICD-10-CM | POA: Diagnosis not present

## 2019-09-29 DIAGNOSIS — I639 Cerebral infarction, unspecified: Secondary | ICD-10-CM | POA: Diagnosis not present

## 2019-09-29 DIAGNOSIS — U071 COVID-19: Secondary | ICD-10-CM | POA: Diagnosis not present

## 2019-09-29 DIAGNOSIS — Z743 Need for continuous supervision: Secondary | ICD-10-CM | POA: Diagnosis not present

## 2019-09-29 LAB — GLUCOSE, CAPILLARY
Glucose-Capillary: 218 mg/dL — ABNORMAL HIGH (ref 70–99)
Glucose-Capillary: 172 mg/dL — ABNORMAL HIGH (ref 70–99)

## 2019-09-29 NOTE — Progress Notes (Signed)
Physical Therapy Treatment Patient Details Name: Russell Carter MRN: [id number] DOB: Oct 22, 1956 Today's Date: 09/29/2019    History of Present Illness Pt is a 63 y.o. male admitted 09/25/19 with SOB, cough and fever; (+) COVID-19. PMH includes CAD, OA, polysubstance abuse, CVA, seizure disorder, CKD III, bipolar disorder, anxiety.   PT Comments    Pt progressing well with mobility. Able to increase ambulation distance with RW at supervision-level; SpO2 >90% on RA, no SOB noted. Pt preparing to d/c home today; will have necessary assist from family; would benefit from tub/shower seat for energy conservation and reduced fall risk. Reviewed educ re: importance of mobility, daily activity recommendations, IS use. Pt has no other questions or concerns.    Follow Up Recommendations  No PT follow up     Equipment Recommendations  Other (tub/shower seat)    Recommendations for Other Services       Precautions / Restrictions Precautions Precautions: Fall Restrictions Weight Bearing Restrictions: No    Mobility  Bed Mobility Overal bed mobility: Independent                Transfers Overall transfer level: Modified independent Equipment used: Rolling walker (2 wheeled) Transfers: Sit to/from Stand           General transfer comment: Requesting use of RW  Ambulation/Gait Ambulation/Gait assistance: Supervision Gait Distance (Feet): 120 Feet Assistive device: Rolling walker (2 wheeled) Gait Pattern/deviations: Step-through pattern;Decreased stride length;Trunk flexed   Gait velocity interpretation: <1.8 ft/sec, indicate of risk for recurrent falls General Gait Details: Slow gait with RW and supervision for safety, increased time with turns; no DOE noted. SpO2 >90% on RA. Further distance limited by fatigue   Stairs             Wheelchair Mobility    Modified Rankin (Stroke Patients Only)       Balance Overall balance assessment: Needs assistance    Sitting balance-Leahy Scale: Good       Standing balance-Leahy Scale: Fair Standing balance comment: Can static stand and walk without UE support; dynamic stability improved with RW                            Cognition Arousal/Alertness: Awake/alert Behavior During Therapy: Flat affect Overall Cognitive Status: Within Functional Limits for tasks assessed                                        Exercises      General Comments General comments (skin integrity, edema, etc.): Educ on energy conservation strategies, pt declining handout. Reviewed IS use (pt demonstrates correct technique; instructed to take home and keep using), daily activity recommendations for return home      Pertinent Vitals/Pain Pain Assessment: Faces Faces Pain Scale: Hurts a little bit Pain Location: Stomach, back, neck... Pain Descriptors / Indicators: Discomfort Pain Intervention(s): Monitored during session;Limited activity within patient's tolerance    Home Living                      Prior Function            PT Goals (current goals can now be found in the care plan section) Progress towards PT goals: Progressing toward goals    Frequency    Min 3X/week      PT Plan Current plan remains appropriate  Co-evaluation              AM-PAC PT "6 Clicks" Mobility   Outcome Measure  Help needed turning from your back to your side while in a flat bed without using bedrails?: None Help needed moving from lying on your back to sitting on the side of a flat bed without using bedrails?: None Help needed moving to and from a bed to a chair (including a wheelchair)?: None Help needed standing up from a chair using your arms (e.g., wheelchair or bedside chair)?: None Help needed to walk in hospital room?: None Help needed climbing 3-5 steps with a railing? : A Little 6 Click Score: 23    End of Session   Activity Tolerance: Patient tolerated treatment  well Patient left: in chair;with call bell/phone within reach Nurse Communication: Mobility status PT Visit Diagnosis: Difficulty in walking, not elsewhere classified (R26.2)     Time: 0092-3300 PT Time Calculation (min) (ACUTE ONLY): 20 min  Charges:  $Therapeutic Exercise: 8-22 mins                    Mabeline Caras, PT, DPT Acute Rehabilitation Services  Pager [phone] Office [place] 09/29/2019, 10:51 AM

## 2019-09-29 NOTE — Plan of Care (Signed)

## 2019-09-29 NOTE — Discharge Summary (Addendum)
Russell Carter OFV:[id number] DOB: 28-Nov-1955 DOA: 09/25/2019  PCP: Ladell Pier, MD  Admit date: 09/25/2019  Discharge date: 09/29/2019  Admitted From: Home   Disposition:  Home   Recommendations for Outpatient Follow-up:   Follow up with PCP in 1-2 weeks  PCP Please obtain BMP/CBC, 2 view CXR in 1week,  (see Discharge instructions)   PCP Please follow up on the following pending results: Check CBC and CMP in 7 to 10 days.   Home Health: PT, OT   Equipment/Devices: Tub Bench  Consultations: None  Discharge Condition: Stable    CODE STATUS: Full    Diet Recommendation: Heart Healthy Low Carb    Chief Complaint  Patient presents with  . Covid/ fever     Brief history of present illness from the day of admission and additional interim summary    63 year old male with past medical history significant for coronary artery disease, osteoarthritis, polysubstance abuse, seizure disorder, previous CVA, bipolar disorder anxiety disorder, hyperlipidemia, CKD stage III, who presented to the ED with complaints of shortness of breath, cough and fever of 1 day duration, he was diagnosed with Covid pneumonia and admitted to the hospital.                                                                  Hospital Course     1. Acute Covid 19 Viral Pneumonitis during the ongoing 2020 Covid 19 Pandemic - he mild disease, treated with IV steroids and remdesivir, he is symptom-free on room air for the last 3 days finished his remdesivir course and steroid course, ambulated in the hallway on room air without any distress, will be discharged home with PCP follow-up.  COVID-19 Labs  Recent Labs    09/27/19 0500 09/28/19 0117  DDIMER  --  <0.27  FERRITIN 108  --   CRP 0.8 0.8    Lab Results  Component Value Date   SARSCOV2NAA POSITIVE (A) 09/25/2019   SARSCOV2NAA NEGATIVE 08/19/2019   [place] NEGATIVE 05/11/2019    Hepatic Function Latest Ref Rng & Units 09/28/2019 09/27/2019 09/26/2019  Total Protein 6.5 - 8.1 g/dL 6.6 5.7(L) 6.1(L)  Albumin 3.5 - 5.0 g/dL 3.3(L) 2.8(L) 3.4(L)  AST 15 - 41 U/L 23 27 55(H)  ALT 0 - 44 U/L 28 30 40  Alk Phosphatase 38 - 126 U/L 39 34(L) 38  Total Bilirubin 0.3 - 1.2 mg/dL 0.3 0.6 0.7  Bilirubin, Direct 0.0 - 0.2 mg/dL - - -      2.  Nonspecific headache.  Negative CT.  Headache is resolved.  3.  History of chronic back pain with polysubstance abuse and narcotic abuse on a chronic basis.  No acute issues supportive care.  Patient was found to have multiple pills in his pocket when he arrived in  the hospital.  Being observed closely.  4.  Seizure disorder.  Stable on combination of Depakote and Vimpat.  5.  Leukopenia related to viral illness.  Improving.  PCP to recheck CBC and CMP in 7 to 10 days post discharge.  6.  BPH.  On Flomax.  7.  Essential hypertension.  Stable currently off medications.  8.    Generalized weakness.  Seen by PT OT, tub bench ordered per patient and OT request, he reports some falls in the bathroom, PT OT for home ordered.    10 DM type II.    Continue home regimen.  Stable outpatient control.  Discharge diagnosis     Principal Problem:   Pneumonia due to COVID-19 virus Active Problems:   Essential hypertension   Polysubstance abuse ([place])   CAD-50% LAD 2011   Dyslipidemia   Depression   Diabetes mellitus type 2 in nonobese (HCC)   History of pulmonary embolus (PE)   Gastroesophageal reflux disease without esophagitis   Benign prostatic hyperplasia (BPH) with straining on urination   Alcohol use   COVID-19 virus infection    Discharge instructions    Discharge Instructions    Discharge instructions   Complete by: As directed    Follow with Primary MD Ladell Pier, MD in 7 days   Get CBC, CMP, 2 view  Chest X ray -  checked next visit within 1 week by Primary MD   Activity: As tolerated with Full fall precautions use walker/cane & assistance as needed  Disposition Home    Diet: Heart Healthy Low Carb.   Special Instructions: If you have smoked or chewed Tobacco  in the last 2 yrs please stop smoking, stop any regular Alcohol  and or any Recreational drug use.  On your next visit with your primary care physician please Get Medicines reviewed and adjusted.  Please request your Prim.MD to go over all Hospital Tests and Procedure/Radiological results at the follow up, please get all Hospital records sent to your Prim MD by signing hospital release before you go home.  If you experience worsening of your admission symptoms, develop shortness of breath, life threatening emergency, suicidal or homicidal thoughts you must seek medical attention immediately by calling 911 or calling your MD immediately  if symptoms less severe.  You Must read complete instructions/literature along with all the possible adverse reactions/side effects for all the Medicines you take and that have been prescribed to you. Take any new Medicines after you have completely understood and accpet all the possible adverse reactions/side effects.   Increase activity slowly   Complete by: As directed    MyChart COVID-19 home monitoring program   Complete by: Sep 29, 2019    Is the patient willing to use the [place] for home monitoring?: Yes   Temperature monitoring   Complete by: Sep 29, 2019    After how many days would you like to receive a notification of this patient's flowsheet entries?: 1      Discharge Medications   Allergies as of 09/29/2019      Reactions   Shellfish Allergy Anaphylaxis      Medication List    TAKE these medications   allopurinol 300 MG tablet Commonly known as: ZYLOPRIM TAKE 1 TABLET (300 MG TOTAL) BY MOUTH DAILY.   amLODipine 5 MG tablet Commonly known as: NORVASC Take  1 tablet (5 mg total) by mouth daily.   apixaban 5 MG Tabs tablet Commonly known as: Eliquis Take  1 tablet (5 mg total) by mouth 2 (two) times daily.   aspirin EC 81 MG tablet Take 1 tablet (81 mg total) by mouth daily.   Calcium-Vitamin D 600-400 MG-UNIT Tabs Take 1 tablet by mouth daily.   chlordiazePOXIDE 10 MG capsule Commonly known as: LIBRIUM Take one capsule three times day for one week, then one capsule twice a day for one week then one capsule daily until supply is out.   cyclobenzaprine 10 MG tablet Commonly known as: FLEXERIL Take 10 mg by mouth 3 (three) times daily.   divalproex 500 MG DR tablet Commonly known as: DEPAKOTE 1 tablet in the morning, 2 in the evening What changed:   how much to take  how to take this  when to take this  additional instructions   DULoxetine 30 MG capsule Commonly known as: CYMBALTA TAKE 1 CAPSULE (30 MG TOTAL) BY MOUTH 2 (TWO) TIMES DAILY.   famotidine 20 MG tablet Commonly known as: PEPCID Take 20 mg by mouth 2 (two) times daily.   folic acid 1 MG tablet Commonly known as: FOLVITE Take 1 tablet (1 mg total) by mouth daily.   gabapentin 100 MG capsule Commonly known as: NEURONTIN Take 2 capsules (200 mg total) by mouth 2 (two) times daily. What changed: how much to take   Lacosamide 100 MG Tabs Take 1 tablet (100 mg total) by mouth 2 (two) times daily.   losartan 100 MG tablet Commonly known as: COZAAR TAKE 1 TABLET (100 MG TOTAL) BY MOUTH DAILY.   metoCLOPramide 10 MG tablet Commonly known as: REGLAN Take 10 mg by mouth 3 (three) times daily before meals.   multivitamin with minerals Tabs tablet Take 1 tablet by mouth daily.   Narcan 4 MG/0.1ML Liqd nasal spray kit Generic drug: naloxone Place 1 spray into the nose once.   nitroGLYCERIN 0.4 MG SL tablet Commonly known as: NITROSTAT Place 1 tablet (0.4 mg total) under the tongue every 5 (five) minutes as needed for chest pain.   oxyCODONE-acetaminophen  7.5-325 MG tablet Commonly known as: Percocet Take 1 tablet by mouth 3 (three) times daily. What changed: when to take this   rosuvastatin 40 MG tablet Commonly known as: CRESTOR Take 1 tablet (40 mg total) by mouth daily.   tamsulosin 0.4 MG Caps capsule Commonly known as: FLOMAX TAKE 1 CAPSULE BY MOUTH AT BEDTIME.   testosterone cypionate 200 MG/ML injection Commonly known as: DEPOTESTOSTERONE CYPIONATE Inject 200 mg into the muscle every 30 (thirty) days.   thiamine 100 MG tablet Commonly known as: VITAMIN B-1 Take 1 tablet (100 mg total) by mouth daily.   Vitamin D (Ergocalciferol) 1.25 MG (50000 UT) Caps capsule Commonly known as: DRISDOL Take 50,000 Units by mouth every 'Sunday.            Durable Medical Equipment  (From admission, onward)         Start     Ordered   09/29/19 1025  For home use only DME Tub bench  Once     12'$ /08/20 1025          Follow-up Information    Ladell Pier, MD. Schedule an appointment as soon as possible for a visit in 1 week(s).   Specialty: Internal Medicine Contact information: [place] [address] [phone]        Josue Hector, MD .   Specialty: Cardiology Contact information: [phone] [place] [address] [phone]  Major procedures and Radiology Reports - PLEASE review detailed and final reports thoroughly  -        Ct Head Wo Contrast  Result Date: 09/25/2019 CLINICAL DATA:  Worst headache of life. EXAM: CT HEAD WITHOUT CONTRAST TECHNIQUE: Contiguous axial images were obtained from the base of the skull through the vertex without intravenous contrast. COMPARISON:  August 19, 2019 FINDINGS: Brain: No subdural, epidural, or subarachnoid hemorrhage. Ventricles and sulci are stable. No acute cortical ischemia or infarct. No mass effect or midline shift. Cerebellum, brainstem, and basal cisterns are normal. Vascular: No hyperdense vessel or  unexpected calcification. Skull: Normal. Negative for fracture or focal lesion. Sinuses/Orbits: No acute finding. Other: None. IMPRESSION: No cause for headache identified. No acute intracranial abnormalities. Electronically Signed   By: Dorise Bullion III M.D   On: 09/25/2019 19:39   Dg Chest Port 1 View  Result Date: 09/25/2019 CLINICAL DATA:  Shortness of breath, headache, weakness, cough, body aches, fever, history CHF, COPD, asthma EXAM: PORTABLE CHEST 1 VIEW COMPARISON:  Portable exam 1730 hours compared to 08/19/2019 FINDINGS: Enlargement of cardiac silhouette. Mediastinal contours and pulmonary vascularity normal. Elevation of LEFT diaphragm. Bibasilar atelectasis. No definite infiltrate, pleural effusion or pneumothorax. Gaseous distention of stomach. Prior cervical fusion. IMPRESSION: Low lung volumes with bibasilar atelectasis. Gaseous distention of stomach. Electronically Signed   By: Lavonia Dana M.D.   On: 09/25/2019 18:23    Micro Results     Recent Results (from the past 240 hour(s))  Urine culture     Status: None   Collection Time: 09/25/19  9:00 PM   Specimen: Urine, Clean Catch  Result Value Ref Range Status   Specimen Description URINE, CLEAN CATCH  Final   Special Requests NONE  Final   Culture   Final    NO GROWTH Performed at [facility], [address], [place], [place] [address]    Report Status 09/27/2019 FINAL  Final  SARS CORONAVIRUS 2 (TAT 6-24 HRS) Nasopharyngeal Nasopharyngeal Swab     Status: Abnormal   Collection Time: 09/25/19  9:38 PM   Specimen: Nasopharyngeal Swab  Result Value Ref Range Status   SARS Coronavirus 2 POSITIVE (A) NEGATIVE Final    Comment: RESULT CALLED TO, READ BACK BY AND VERIFIED WITHLennox Solders RN [phone] 09/26/2019 T. TYSOR (NOTE) SARS-CoV-2 target nucleic acids are DETECTED. The SARS-CoV-2 RNA is generally detectable in upper and lower respiratory specimens during the acute phase of infection. Positive results are  indicative of the presence of SARS-CoV-2 RNA. Clinical correlation with patient history and other diagnostic information is  necessary to determine patient infection status. Positive results do not rule out bacterial infection or co-infection with other viruses.  The expected result is Negative. Fact Sheet for Patients: SugarRoll.be Fact Sheet for Healthcare Providers: https://www.woods-mathews.com/ This test is not yet approved or cleared by the [place] FDA and  has been authorized for detection and/or diagnosis of SARS-CoV-2 by FDA under an Emergency Use Authorization (EUA). This EUA will remain  in effect (meaning this test can be use d) for the duration of the COVID-19 declaration under Section 564(b)(1) of the Act, 21 U.S.C. section 360bbb-3(b)(1), unless the authorization is terminated or revoked sooner. Performed at [facility], [place] [address], [place], [place] [address]   Blood Culture (routine x 2)     Status: None (Preliminary result)   Collection Time: 09/26/19 12:45 AM   Specimen: BLOOD  Result Value Ref Range Status  Specimen Description BLOOD LEFT ARM  Final   Special Requests   Final    BOTTLES DRAWN AEROBIC AND ANAEROBIC Blood Culture adequate volume   Culture   Final    NO GROWTH 3 DAYS Performed at [facility], [address], [place], [place] [address]    Report Status PENDING  Incomplete    Today   Subjective    Amedeo Comas today has no headache,no chest abdominal pain,no new weakness tingling or numbness, feels much better     Objective   Blood pressure 133/87, pulse 65, temperature 97.6 F (36.4 C), temperature source Oral, resp. rate 15, height '5\' 11"'$  (1.803 m), weight 95.3 kg, SpO2 97 %.   Intake/Output Summary (Last 24 hours) at 09/29/2019 1025 Last data filed at 09/29/2019 0530 Gross per 24 hour  Intake 360 ml  Output 1275 ml  Net -915 ml    Exam Awake Alert,   No new F.N  deficits, Normal affect [place].AT,PERRAL Supple Neck,No JVD, No cervical lymphadenopathy appriciated.  Symmetrical Chest wall movement, Good air movement bilaterally, CTAB RRR,No Gallops,Rubs or new Murmurs, No Parasternal Heave +ve B.Sounds, Abd Soft, Non tender, No organomegaly appriciated, No rebound -guarding or rigidity. No Cyanosis, Clubbing or edema, No new Rash or bruise   Data Review   CBC w Diff:  Lab Results  Component Value Date   WBC 6.0 09/28/2019   HGB 13.8 09/28/2019   HGB 14.2 04/07/2018   HCT 43.8 09/28/2019   HCT 43.5 04/07/2018   PLT 206 09/28/2019   PLT 215 04/07/2018   LYMPHOPCT 15 09/28/2019   MONOPCT 11 09/28/2019   EOSPCT 0 09/28/2019   BASOPCT 0 09/28/2019    CMP:  Lab Results  Component Value Date   NA 139 09/28/2019   NA 139 09/07/2019   K 4.4 09/28/2019   CL 107 09/28/2019   CO2 22 09/28/2019   BUN 23 09/28/2019   BUN 6 (L) 09/07/2019   CREATININE 1.13 09/28/2019   PROT 6.6 09/28/2019   PROT 6.8 09/07/2019   ALBUMIN 3.3 (L) 09/28/2019   ALBUMIN 4.4 09/07/2019   BILITOT 0.3 09/28/2019   BILITOT <0.2 09/07/2019   ALKPHOS 39 09/28/2019   AST 23 09/28/2019   ALT 28 09/28/2019  .   Total Time in preparing paper work, data evaluation and todays exam - 74 minutes  Lala Lund M.D on 09/29/2019 at 10:25 AM  [facility]   Office  [phone]

## 2019-09-29 NOTE — Consult Note (Signed)
   [facility] CM Inpatient Consult   09/29/2019  Russell Carter 1956/05/09 [id number]    Patient screened for 41% extreme high risk score of unplanned readmission, with 3 hospitalizations and 4 ED visits in the past 6 months, as a benefit under his [facility] plan.   Patient had previous outreaches from [place] coordinator for [facility] stroke follow-up calls.  Patient was admitted at [facility]) for Covid pneumonia.  However, patient iscurrently NOT a beneficiary of the attributed [place] in the [employer].  This patient is Notcovered for [place].   Reason:Not a beneficiary currently attributed to one of the [place] Registry populations. Membership roster was used to verify non-eligible status.    For questions, please contact:  Edwena Felty A. Thaniel Coluccio, BSN, RN-BC [facility] Liaison Cell: [phone]

## 2019-09-29 NOTE — Progress Notes (Signed)
   09/29/19 1337  Patient Belongings/Medications Returned  Patient belongings from bedside/safe/pharmacy returned  Yes  Valuables returned to? Zaid Zeigler  Specify valuables returned $135, clothing, cell phone, wallet, medication  Money counted in front of patient. One fifty dollar bill, three twenty dollar bills, one ten dollar bill, fifteen dollar bills; total $135. Medication returned.

## 2019-09-29 NOTE — TOC Transition Note (Addendum)
Transition of Care [facility]) - CM/SW Discharge Note   Patient Details  Name: Russell Carter MRN: [id number] Date of Birth: 19-May-1956  Transition of Care [facility]) CM/SW Contact:  Ihor Gully, LCSW Phone Number: 09/29/2019, 12:31 PM   Clinical Narrative:    Patient orders for DME (tub seat) and HH (PT/OT) placed and referred to [facility] for DME and resumed with [facility].  Patient wants to discharge home via [place]. PT arranged for transport at 1:30 Tiffany from [place] called back and indicted that on 10/1 patient switched to [place] and that he was no longer in network. Referral faxed to [facility] at [phone].   Final next level of care: Home w Home Health Services Barriers to Discharge: No Barriers Identified   Patient Goals and CMS Choice Patient states their goals for this hospitalization and ongoing recovery are:: Return home to baseline      Discharge Placement                       Discharge Plan and Services                  DME Agency: [place] Date DME Agency Contacted: 09/29/19 Time DME Agency Contacted: 1228 Representative spoke with at DME Agency: leslie rose [place] Arranged: PT, OT HH Agency: [facility] Date [place]: 09/29/19 Time [place]: 1228 Representative spoke with at [place]: [place] ([place]) Interventions     Readmission Risk Interventions Readmission Risk Prevention Plan 09/29/2019  Transportation Screening Complete  Medication Review Press photographer) Complete  PCP or Specialist appointment within 3-5 days of discharge Not Complete  PCP/Specialist Appt Not Complete comments called and was on hold for over 15 minutes  HRI or [place] Complete  SW Recovery Care/Counseling Consult Complete  [place] Not Applicable  Some recent data might be hidden

## 2019-09-29 NOTE — Discharge Instructions (Signed)
Follow with Primary MD Ladell Pier, MD in 7 days   Get CBC, CMP, 2 view Chest X ray -  checked next visit within 1 week by Primary MD   Activity: As tolerated with Full fall precautions use walker/cane & assistance as needed  Disposition Home    Diet: Heart Healthy Low Carb.   Special Instructions: If you have smoked or chewed Tobacco  in the last 2 yrs please stop smoking, stop any regular Alcohol  and or any Recreational drug use.  On your next visit with your primary care physician please Get Medicines reviewed and adjusted.  Please request your Prim.MD to go over all Hospital Tests and Procedure/Radiological results at the follow up, please get all Hospital records sent to your Prim MD by signing hospital release before you go home.  If you experience worsening of your admission symptoms, develop shortness of breath, life threatening emergency, suicidal or homicidal thoughts you must seek medical attention immediately by calling 911 or calling your MD immediately  if symptoms less severe.  You Must read complete instructions/literature along with all the possible adverse reactions/side effects for all the Medicines you take and that have been prescribed to you. Take any new Medicines after you have completely understood and accpet all the possible adverse reactions/side effects.       Person Under Monitoring Name: Russell Carter  Location: [place] [address]   Infection Prevention Recommendations for Individuals Confirmed to have, or Being Evaluated for, 2019 Novel Coronavirus (COVID-19) Infection Who Receive Care at Home  Individuals who are confirmed to have, or are being evaluated for, COVID-19 should follow the prevention steps below until a healthcare provider or local or state health department says they can return to normal activities.  Stay home except to get medical care You should restrict activities outside your home, except for getting  medical care. Do not go to work, school, or public areas, and do not use public transportation or taxis.  Call ahead before visiting your doctor Before your medical appointment, call the healthcare provider and tell them that you have, or are being evaluated for, COVID-19 infection. This will help the healthcare providers office take steps to keep other people from getting infected. Ask your healthcare provider to call the local or state health department.  Monitor your symptoms Seek prompt medical attention if your illness is worsening (e.g., difficulty breathing). Before going to your medical appointment, call the healthcare provider and tell them that you have, or are being evaluated for, COVID-19 infection. Ask your healthcare provider to call the local or state health department.  Wear a facemask You should wear a facemask that covers your nose and mouth when you are in the same room with other people and when you visit a healthcare provider. People who live with or visit you should also wear a facemask while they are in the same room with you.  Separate yourself from other people in your home As much as possible, you should stay in a different room from other people in your home. Also, you should use a separate bathroom, if available.  Avoid sharing household items You should not share dishes, drinking glasses, cups, eating utensils, towels, bedding, or other items with other people in your home. After using these items, you should wash them thoroughly with soap and water.  Cover your coughs and sneezes Cover your mouth and nose with a tissue when you cough or sneeze, or you can cough or  sneeze into your sleeve. Throw used tissues in a lined trash can, and immediately wash your hands with soap and water for at least 20 seconds or use an alcohol-based hand rub.  Wash your [facility] your hands often and thoroughly with soap and water for at least 20 seconds. You can use an  alcohol-based hand sanitizer if soap and water are not available and if your hands are not visibly dirty. Avoid touching your eyes, nose, and mouth with unwashed hands.   Prevention Steps for Caregivers and Household Members of Individuals Confirmed to have, or Being Evaluated for, COVID-19 Infection Being Cared for in the Home  If you live with, or provide care at home for, a person confirmed to have, or being evaluated for, COVID-19 infection please follow these guidelines to prevent infection:  Follow healthcare providers instructions Make sure that you understand and can help the patient follow any healthcare provider instructions for all care.  Provide for the patients basic needs You should help the patient with basic needs in the home and provide support for getting groceries, prescriptions, and other personal needs.  Monitor the patients symptoms If they are getting sicker, call his or her medical provider and tell them that the patient has, or is being evaluated for, COVID-19 infection. This will help the healthcare providers office take steps to keep other people from getting infected. Ask the healthcare provider to call the local or state health department.  Limit the number of people who have contact with the patient  If possible, have only one caregiver for the patient.  Other household members should stay in another home or place of residence. If this is not possible, they should stay  in another room, or be separated from the patient as much as possible. Use a separate bathroom, if available.  Restrict visitors who do not have an essential need to be in the home.  Keep older adults, very young children, and other sick people away from the patient Keep older adults, very young children, and those who have compromised immune systems or chronic health conditions away from the patient. This includes people with chronic heart, lung, or kidney conditions, diabetes, and  cancer.  Ensure good ventilation Make sure that shared spaces in the home have good air flow, such as from an air conditioner or an opened window, weather permitting.  Wash your hands often  Wash your hands often and thoroughly with soap and water for at least 20 seconds. You can use an alcohol based hand sanitizer if soap and water are not available and if your hands are not visibly dirty.  Avoid touching your eyes, nose, and mouth with unwashed hands.  Use disposable paper towels to dry your hands. If not available, use dedicated cloth towels and replace them when they become wet.  Wear a facemask and gloves  Wear a disposable facemask at all times in the room and gloves when you touch or have contact with the patients blood, body fluids, and/or secretions or excretions, such as sweat, saliva, sputum, nasal mucus, vomit, urine, or feces.  Ensure the mask fits over your nose and mouth tightly, and do not touch it during use.  Throw out disposable facemasks and gloves after using them. Do not reuse.  Wash your hands immediately after removing your facemask and gloves.  If your personal clothing becomes contaminated, carefully remove clothing and launder. Wash your hands after handling contaminated clothing.  Place all used disposable facemasks, gloves, and other waste  in a lined container before disposing them with other household waste.  Remove gloves and wash your hands immediately after handling these items.  Do not share dishes, glasses, or other household items with the patient  Avoid sharing household items. You should not share dishes, drinking glasses, cups, eating utensils, towels, bedding, or other items with a patient who is confirmed to have, or being evaluated for, COVID-19 infection.  After the person uses these items, you should wash them thoroughly with soap and water.  Wash laundry thoroughly  Immediately remove and wash clothes or bedding that have blood, body  fluids, and/or secretions or excretions, such as sweat, saliva, sputum, nasal mucus, vomit, urine, or feces, on them.  Wear gloves when handling laundry from the patient.  Read and follow directions on labels of laundry or clothing items and detergent. In general, wash and dry with the warmest temperatures recommended on the label.  Clean all areas the individual has used often  Clean all touchable surfaces, such as counters, tabletops, doorknobs, bathroom fixtures, toilets, phones, keyboards, tablets, and bedside tables, every day. Also, clean any surfaces that may have blood, body fluids, and/or secretions or excretions on them.  Wear gloves when cleaning surfaces the patient has come in contact with.  Use a diluted bleach solution (e.g., dilute bleach with 1 part bleach and 10 parts water) or a household disinfectant with a label that says EPA-registered for coronaviruses. To make a bleach solution at home, add 1 tablespoon of bleach to 1 quart (4 cups) of water. For a larger supply, add  cup of bleach to 1 gallon (16 cups) of water.  Read labels of cleaning products and follow recommendations provided on product labels. Labels contain instructions for safe and effective use of the cleaning product including precautions you should take when applying the product, such as wearing gloves or eye protection and making sure you have good ventilation during use of the product.  Remove gloves and wash hands immediately after cleaning.  Monitor yourself for signs and symptoms of illness Caregivers and household members are considered close contacts, should monitor their health, and will be asked to limit movement outside of the home to the extent possible. Follow the monitoring steps for close contacts listed on the symptom monitoring form.   ? If you have additional questions, contact your local health department or call the epidemiologist on call at [phone] (available 24/7). ? This  guidance is subject to change. For the most up-to-date guidance from [facility], please refer to their website: [email or web address]

## 2019-09-29 NOTE — TOC Initial Note (Addendum)
Transition of Care [facility]) - Initial/Assessment Note    Patient Details  Name: Russell Carter MRN: [id number] Date of Birth: Jul 20, 1956  Transition of [facility]) CM/SW Contact:    Ihor Gully, LCSW Phone Number: 09/29/2019, 9:38 AM  Clinical Narrative:                 Patient from home with fiance and her children where three other people are Covid+. Admitted for Pneumonia due to COVID-19 virus. At baseline, patient uses a cane mostly but also has a walker. He has no issues with maintaining PCP/specialist appointments nor obtaining medications. He reports that he is discharging today and in the process of arranging transportation to pick him up later this afternoon.  Multiple attempts were made to schedule a PCP follow up appointment, no on picked up after being on hold for over 15 minutes.    Shower chair ordered through [employer] and will be delivered to patient's home, patient advised.  Expected Discharge Plan: Home/Self Care Barriers to Discharge: No Barriers Identified   Patient Goals and CMS Choice Patient states their goals for this hospitalization and ongoing recovery are:: Return home to baseline      Expected Discharge Plan and Services Expected Discharge Plan: Home/Self Care       Living arrangements for the past 2 months: Single Family Home Expected Discharge Date: 09/29/19                                    Prior Living Arrangements/Services Living arrangements for the past 2 months: Single Family Home Lives with:: Significant Other, Other (Comment)(fiance's children) Patient language and need for interpreter reviewed:: Yes Do you feel safe going back to the place where you live?: Yes      Need for Family Participation in Patient Care: Yes (Comment) Care giver support system in place?: Yes (comment) Current home services: DME    Activities of Daily Living Home Assistive Devices/Equipment: Cane (specify quad or straight), Eyeglasses, Walker (specify  type) ADL Screening (condition at time of admission) Patient's cognitive ability adequate to safely complete daily activities?: Yes Is the patient deaf or have difficulty hearing?: No Does the patient have difficulty seeing, even when wearing glasses/contacts?: No Does the patient have difficulty concentrating, remembering, or making decisions?: No Patient able to express need for assistance with ADLs?: Yes Does the patient have difficulty dressing or bathing?: No Independently performs ADLs?: Yes (appropriate for developmental age) Does the patient have difficulty walking or climbing stairs?: No Weakness of Legs: Both Weakness of Arms/Hands: None  Permission Sought/Granted Permission sought to share information with : PCP          Permission granted to share info w Relationship: call to PCP office remained unanswered and on hold for over 15 minutes     Emotional Assessment Appearance:: Appears stated age   Affect (typically observed): Appropriate Orientation: : Oriented to Self, Oriented to Place, Oriented to  Time, Oriented to Situation Alcohol / Substance Use: Not Applicable Psych Involvement: No (comment)  Admission diagnosis:  COVID-19 virus infection [U07.1] Patient Active Problem List   Diagnosis Date Noted  . COVID-19 virus infection 09/27/2019  . Pneumonia due to COVID-19 virus 09/25/2019  . Alcohol use 09/07/2019  . Benign prostatic hyperplasia (BPH) with straining on urination   . History of stroke without residual deficits 12/24/2018  . Seizure ([place]) 05/06/2018  . Normocytic anemia 05/06/2018  . Chronic  anticoagulation 04/27/2018  . Sensorineural hearing loss (SNHL), bilateral 04/15/2018  . Left knee pain 01/10/2018  . Syncope 09/04/2017  . Numbness and tingling in both hands 07/31/2017  . Gastroesophageal reflux disease without esophagitis 07/31/2017  . Chronic pain disorder 06/05/2017  . History of pulmonary embolus (PE) 02/14/2017  . Diabetes mellitus  type 2 in nonobese (HCC)   . Cocaine abuse (HCC)   . Depression   . History of CVA (cerebrovascular accident)   . Cervical spondylosis with radiculopathy 01/24/2017  . Dyslipidemia 03/15/2016  . Cervical spinal stenosis 03/14/2016  . CAD-50% LAD 2011 12/14/2014  . Gout 11/16/2014  . Polysubstance abuse (HCC) 01/15/2013  . DIZZINESS 06/05/2010  . Essential hypertension 05/18/2009   PCP:  Marcine Matar, MD Pharmacy:   [facility] [address], [place] - [address] [place] [address] Phone: [phone] Fax: [phone]  [facility] [place], [place] - [place] [address] [place] [address] Phone: [phone] Fax: [phone]     Social Determinants of Health (SDOH) Interventions    Readmission Risk Interventions Readmission Risk Prevention Plan 09/29/2019  Transportation Screening Complete  Medication Review Oceanographer) Complete  PCP or Specialist appointment within 3-5 days of discharge Not Complete  PCP/Specialist Appt Not Complete comments called and was on hold for over 15 minutes  HRI or Home Care Consult Complete  SW Recovery Care/Counseling Consult Complete  Palliative Care Screening Not Applicable  Skilled Nursing Facility Not Applicable  Some recent data might be hidden

## 2019-09-29 NOTE — Clinical Social Work Note (Signed)
Patient Information  Patient Name  Russell Carter, Russell Carter ([id number]) Sex  Male DOB  [birth year]  Room Bed  9165 9165-01  Patient Demographics  Address  [address]  [place] [address] Phone  [phone] [facility])  [phone] (Mobile) E-mail Address  [email or web address]  Patient Ethnicity & Race  Ethnic Group Patient Race  Not Hispanic or Latino Black or African American  Emergency Contact(s)  Name Relation Home Work Mobile  Fewell Jr,Darrion Son   [phone]  [facility] Mother [phone]    Documents on File   Status Date Received Description  Documents for the Patient  EMR Medication Summary Not Received    EMR Problem Summary Not Received    EMR Patient Summary Not Received    Driver's License Received 06/22/10   [facility] E-Signature HIPAA Notice of Privacy Signed 01/31/11   Advance Directives/Living Will/HCPOA/POA Not Received    Historic Radiology Documentation Not Received    Driver's License Received 08/22/11 Exp 2015 - [facility]  Insurance Card Received 08/22/11 GCCN - [facility]  [place] E-Signature HIPAA Notice of Privacy Signed 08/22/11 [facility]  [place] HIPAA NOTICE OF PRIVACY - Scanned Received 08/22/11 [facility]  AMB Correspondence Not Received  11/12 OR Post CCS  Release of Information Received 11/29/11   Financial Application Not Received    [employer] Notice of Privacy - Scanned Not Received    [employer] HIPAA Notice of Privacy - E Signature Not Received    Other Not Received    Insurance Card Received 04/29/12 [facility] 04/28/12-10/29/12  Release of Information Not Received    Financial Application Received 05/05/14 Financial application  Insurance Card Received 11/16/14 [place] access  Other Photo ID Received 04/17/17 NCID exp. 02/04/2022  Financial Application   BANKRUPTCY NOTICE  Release of Information Received 03/30/16 [facility] DPR  HIM ROI Authorization  04/10/16 [place] [facility]  AMB Correspondence  04/09/16 CARDIAC REHAB [facility]   Insurance Card  01/04/17 MEDICAID  Release of Information  02/28/17   AMB Correspondence  03/28/17 AGREEMENT FOR CONTROLLED SUBSTANCE RX [facility] MED REHAB  Advanced Beneficiary Notice (ABN) Not Received    E-Signature AOB [place] Not Received    [place] E-Signature HIPAA Notice of Privacy Signed 08/07/17 [facility]  [place] HIPAA NOTICE OF PRIVACY - Scanned Received 08/16/17 301 rec 08/16/2017  Insurance Card Received 08/16/17 301 rec 08/16/2017  HIM ROI Authorization (Expired) 10/16/17 [place] law  HIM ROI Authorization  10/18/17 [employer]  HIM ROI Authorization  12/03/17 [place] DDS [facility]  HIM ROI Authorization  12/03/17 [place] DDS  HIM ROI Authorization  12/04/17 Signed form  HIM ROI Authorization  12/06/17 [facility] [place]  HIM ROI Authorization  12/12/17 [employer] [place]  AMB Provider Completed Forms Received 01/01/18 [place]  HIM ROI Authorization  01/07/18 [employer]  Insurance Card Received 01/15/18 MEDICAID  HIM ROI Authorization  03/03/18 [employer]  HIM ROI Authorization  03/07/18 [place] DDS  HIM ROI Authorization  04/29/18 Disability  HIM ROI Authorization  05/30/18 [employer]  [place] E-Signature HIPAA Notice of Privacy Signed 08/27/18    E-Signature HIPAA Notice of Privacy [place]     AMB Correspondence Received 05/27/18 OFFICE VISIT [facility]  HIM ROI Authorization Received 12/30/18 [facility] [place]  AMB Outside Consult Note Received 01/13/19 [facility]  AMB Outside Consult Note Received 04/01/18 [facility]  AMB HH/NH/Hospice Received 02/09/19 ORDER [facility]  AMB HH/NH/Hospice Received 02/09/19 [facility] DISCHARGE/TRANSFER SUMMARY  [facility]  HIM ROI Authorization Received 02/24/19 [place] Authorization Received 03/13/19 [place]  AMB HH/NH/Hospice Received 95/63/87 CERTIFICATION/POC [facility]  HIM  ROI Authorization Received 05/18/19 [facility] [place]  [place] E-Signature HIPAA Notice of Privacy Signed 06/23/19   Insurance Card Received 08/28/19 [facility] HIPAA NOTICE OF PRIVACY - Scanned Received (Deleted) 01/21/11   [place] E-Signature HIPAA Notice of Privacy [place] Not Received (Deleted)    Insurance Card Not Received (Deleted)    Driver's License (Deleted)    Insurance Card (Deleted)     HIPAA NOTICE OF PRIVACY - Scanned (Deleted)    Insurance Card (Deleted)    Insurance Card Received (Deleted) 12/26/16   Patient Photo   Photo of Patient  HIM Release of Information Output  10/16/17 Progress Note  HIM Release of Information Output  10/29/17 Abstract of Health Information  HIM Release of Information Output (Deleted) 12/03/17 Entire Encounter  HIM Release of Information Output (Deleted) 12/04/17 Discharge Summary, History and Physical, Consultation Report, Operative and Procedural Report, Notes Individual  HIM Release of Information Output (Deleted) 01/15/18 Requested records  HIM Release of Information Output (Deleted) 03/07/18 Requested records  HIM Release of Information Output (Deleted) 03/20/18 Requested records  HIM Release of Information Output (Deleted) 05/14/18 Requested records  HIM Release of Information Output  06/26/18 Requested records  [place] E-Signature HIPAA Notice of Privacy [place] (Deleted)    HIM Release of Information Output (Deleted) 12/30/18 Requested records  HIM Release of Information Output (Deleted) 01/02/19 Requested records  AMB Correspondence Received (Deleted) 04/01/18 [facility]  HIM Release of Information Output (Deleted) 02/24/19 Requested records  HIM Release of Information Output (Deleted) 02/24/19   HIM Release of Information Output (Deleted) 03/16/19 Requested records  HIM Release of Information Output  05/18/19 Requested records  HIM Release of Information Output  05/18/19  Requested records  Documents for the Encounter  AOB (Assignment of Insurance Benefits) Received 09/25/19 AOB 09-25-2019  E-signature AOB     MEDICARE RIGHTS Received 09/28/19   E-signature Medicare Rights     EMS Run Sheet Received 09/25/19   ED Patient Billing Extract   ED PB Billing Extract  ED Transfer Consent Signed 09/27/19   Cardiac Monitoring Strip Received 09/27/19   Cardiac Monitoring Strip Shift Summary Received 09/27/19   EMS Run Sheet Received 09/28/19   After Visit Summary   IP After Visit Summary  EKG Received 09/29/19   Admission Information  Current Information  Attending Provider Admitting Provider Admission Type Admission Status  Thurnell Lose, MD Kayleen Memos, DO Emergency Admission (Confirmed)       Admission Date/Time Discharge Date Hospital Service Auth/Cert Status  56/43/32 04:33 PM  [place] [facility] Room/Bed   [place] A 9165/9165-01        Admission  Complaint  General weakness, Possible [facility] Account  Name Acct ID Class Status Primary Coverage  Russell Carter, Russell Carter [id number] Inpatient Open [place]      Guarantor Account (for Hospital Account [id number])  Name Relation to Pt Service Area Active? Acct Type  Polak, Gloriann Loan [facility] Yes Personal/Family  Address Phone    [address]  [place], [place] [address] [phone])        Coverage Information (for Hospital Account [id number])  1. AETNA MEDICARE/AETNA MEDICARE HMO/PPO  F/O Payor/Plan  Precert #  Monia Pouch MEDICARE/AETNA MEDICARE HMO/PPO   Subscriber Subscriber #  Hunt, Zajicek [id number]  Address Phone  [address]  [place], [place] [address] [phone]  2. MEDICAID [place]/MEDICAID [place] ACCESS  F/O Payor/Plan Precert #  MEDICAID [place]/MEDICAID [facility] ACCESS   Subscriber Subscriber #  Argelio, Granier [id number]  Address Phone  [address] [address]  [place], [place] [address]  [phone]       Care Everywhere ID:  [id number]

## 2019-09-30 NOTE — Clinical Social Work Note (Addendum)
[facility] declined patient because they are in complete hold for all disciplines currently.   Patient's Russell Carter is not in network with [facility].  [facility] is not in network.   Patient ambulated 120 feet during PT evaluation. Patient has [employer] and is not in network with multiple insurance companies.    [name], [name], LCSW

## 2019-10-01 LAB — CULTURE, BLOOD (ROUTINE X 2)
Culture: NO GROWTH
Special Requests: ADEQUATE

## 2019-10-02 ENCOUNTER — Inpatient Hospital Stay (HOSPITAL_COMMUNITY)
Admission: EM | Admit: 2019-10-02 | Discharge: 2019-11-23 | DRG: 207 | Disposition: E | Payer: Medicare HMO | Attending: Pulmonary Disease | Admitting: Pulmonary Disease

## 2019-10-02 ENCOUNTER — Encounter (HOSPITAL_COMMUNITY): Payer: Self-pay | Admitting: *Deleted

## 2019-10-02 ENCOUNTER — Emergency Department (HOSPITAL_COMMUNITY): Payer: Medicare HMO

## 2019-10-02 ENCOUNTER — Other Ambulatory Visit: Payer: Self-pay

## 2019-10-02 DIAGNOSIS — R0902 Hypoxemia: Secondary | ICD-10-CM

## 2019-10-02 DIAGNOSIS — R3916 Straining to void: Secondary | ICD-10-CM | POA: Diagnosis not present

## 2019-10-02 DIAGNOSIS — N183 Chronic kidney disease, stage 3 unspecified: Secondary | ICD-10-CM | POA: Diagnosis present

## 2019-10-02 DIAGNOSIS — J690 Pneumonitis due to inhalation of food and vomit: Secondary | ICD-10-CM | POA: Diagnosis not present

## 2019-10-02 DIAGNOSIS — E7800 Pure hypercholesterolemia, unspecified: Secondary | ICD-10-CM | POA: Diagnosis present

## 2019-10-02 DIAGNOSIS — E669 Obesity, unspecified: Secondary | ICD-10-CM | POA: Diagnosis not present

## 2019-10-02 DIAGNOSIS — I252 Old myocardial infarction: Secondary | ICD-10-CM

## 2019-10-02 DIAGNOSIS — E1122 Type 2 diabetes mellitus with diabetic chronic kidney disease: Secondary | ICD-10-CM | POA: Diagnosis present

## 2019-10-02 DIAGNOSIS — Z808 Family history of malignant neoplasm of other organs or systems: Secondary | ICD-10-CM

## 2019-10-02 DIAGNOSIS — R4182 Altered mental status, unspecified: Secondary | ICD-10-CM

## 2019-10-02 DIAGNOSIS — Z6831 Body mass index (BMI) 31.0-31.9, adult: Secondary | ICD-10-CM | POA: Diagnosis not present

## 2019-10-02 DIAGNOSIS — F1910 Other psychoactive substance abuse, uncomplicated: Secondary | ICD-10-CM | POA: Diagnosis not present

## 2019-10-02 DIAGNOSIS — N390 Urinary tract infection, site not specified: Secondary | ICD-10-CM | POA: Diagnosis not present

## 2019-10-02 DIAGNOSIS — Z79899 Other long term (current) drug therapy: Secondary | ICD-10-CM

## 2019-10-02 DIAGNOSIS — R519 Headache, unspecified: Secondary | ICD-10-CM | POA: Diagnosis not present

## 2019-10-02 DIAGNOSIS — R69 Illness, unspecified: Secondary | ICD-10-CM | POA: Diagnosis not present

## 2019-10-02 DIAGNOSIS — R109 Unspecified abdominal pain: Secondary | ICD-10-CM | POA: Diagnosis not present

## 2019-10-02 DIAGNOSIS — E870 Hyperosmolality and hypernatremia: Secondary | ICD-10-CM | POA: Diagnosis not present

## 2019-10-02 DIAGNOSIS — Z87891 Personal history of nicotine dependence: Secondary | ICD-10-CM

## 2019-10-02 DIAGNOSIS — A419 Sepsis, unspecified organism: Secondary | ICD-10-CM | POA: Diagnosis not present

## 2019-10-02 DIAGNOSIS — Z955 Presence of coronary angioplasty implant and graft: Secondary | ICD-10-CM

## 2019-10-02 DIAGNOSIS — R404 Transient alteration of awareness: Secondary | ICD-10-CM | POA: Diagnosis not present

## 2019-10-02 DIAGNOSIS — F1410 Cocaine abuse, uncomplicated: Secondary | ICD-10-CM | POA: Diagnosis present

## 2019-10-02 DIAGNOSIS — J9601 Acute respiratory failure with hypoxia: Secondary | ICD-10-CM

## 2019-10-02 DIAGNOSIS — I129 Hypertensive chronic kidney disease with stage 1 through stage 4 chronic kidney disease, or unspecified chronic kidney disease: Secondary | ICD-10-CM | POA: Diagnosis present

## 2019-10-02 DIAGNOSIS — R6521 Severe sepsis with septic shock: Secondary | ICD-10-CM | POA: Diagnosis not present

## 2019-10-02 DIAGNOSIS — I69354 Hemiplegia and hemiparesis following cerebral infarction affecting left non-dominant side: Secondary | ICD-10-CM

## 2019-10-02 DIAGNOSIS — I469 Cardiac arrest, cause unspecified: Secondary | ICD-10-CM | POA: Diagnosis not present

## 2019-10-02 DIAGNOSIS — N179 Acute kidney failure, unspecified: Secondary | ICD-10-CM | POA: Diagnosis not present

## 2019-10-02 DIAGNOSIS — Z86711 Personal history of pulmonary embolism: Secondary | ICD-10-CM | POA: Diagnosis present

## 2019-10-02 DIAGNOSIS — M4802 Spinal stenosis, cervical region: Secondary | ICD-10-CM | POA: Diagnosis present

## 2019-10-02 DIAGNOSIS — E875 Hyperkalemia: Secondary | ICD-10-CM | POA: Diagnosis not present

## 2019-10-02 DIAGNOSIS — E1165 Type 2 diabetes mellitus with hyperglycemia: Secondary | ICD-10-CM | POA: Diagnosis not present

## 2019-10-02 DIAGNOSIS — G8929 Other chronic pain: Secondary | ICD-10-CM | POA: Diagnosis present

## 2019-10-02 DIAGNOSIS — Z79890 Hormone replacement therapy (postmenopausal): Secondary | ICD-10-CM

## 2019-10-02 DIAGNOSIS — I2510 Atherosclerotic heart disease of native coronary artery without angina pectoris: Secondary | ICD-10-CM | POA: Diagnosis present

## 2019-10-02 DIAGNOSIS — R7401 Elevation of levels of liver transaminase levels: Secondary | ICD-10-CM

## 2019-10-02 DIAGNOSIS — Z8673 Personal history of transient ischemic attack (TIA), and cerebral infarction without residual deficits: Secondary | ICD-10-CM

## 2019-10-02 DIAGNOSIS — Z7289 Other problems related to lifestyle: Secondary | ICD-10-CM | POA: Diagnosis not present

## 2019-10-02 DIAGNOSIS — Y95 Nosocomial condition: Secondary | ICD-10-CM | POA: Diagnosis not present

## 2019-10-02 DIAGNOSIS — Z981 Arthrodesis status: Secondary | ICD-10-CM

## 2019-10-02 DIAGNOSIS — F419 Anxiety disorder, unspecified: Secondary | ICD-10-CM | POA: Diagnosis present

## 2019-10-02 DIAGNOSIS — G9341 Metabolic encephalopathy: Secondary | ICD-10-CM | POA: Diagnosis not present

## 2019-10-02 DIAGNOSIS — L8990 Pressure ulcer of unspecified site, unspecified stage: Secondary | ICD-10-CM | POA: Insufficient documentation

## 2019-10-02 DIAGNOSIS — I462 Cardiac arrest due to underlying cardiac condition: Secondary | ICD-10-CM | POA: Diagnosis not present

## 2019-10-02 DIAGNOSIS — G40909 Epilepsy, unspecified, not intractable, without status epilepticus: Secondary | ICD-10-CM | POA: Diagnosis present

## 2019-10-02 DIAGNOSIS — G92 Toxic encephalopathy: Secondary | ICD-10-CM | POA: Diagnosis not present

## 2019-10-02 DIAGNOSIS — E119 Type 2 diabetes mellitus without complications: Secondary | ICD-10-CM

## 2019-10-02 DIAGNOSIS — R52 Pain, unspecified: Secondary | ICD-10-CM | POA: Diagnosis not present

## 2019-10-02 DIAGNOSIS — K7689 Other specified diseases of liver: Secondary | ICD-10-CM | POA: Diagnosis not present

## 2019-10-02 DIAGNOSIS — N401 Enlarged prostate with lower urinary tract symptoms: Secondary | ICD-10-CM | POA: Diagnosis not present

## 2019-10-02 DIAGNOSIS — J1289 Other viral pneumonia: Secondary | ICD-10-CM | POA: Diagnosis not present

## 2019-10-02 DIAGNOSIS — U071 COVID-19: Secondary | ICD-10-CM | POA: Diagnosis not present

## 2019-10-02 DIAGNOSIS — Z8249 Family history of ischemic heart disease and other diseases of the circulatory system: Secondary | ICD-10-CM

## 2019-10-02 DIAGNOSIS — I10 Essential (primary) hypertension: Secondary | ICD-10-CM | POA: Diagnosis present

## 2019-10-02 DIAGNOSIS — F1010 Alcohol abuse, uncomplicated: Secondary | ICD-10-CM | POA: Diagnosis present

## 2019-10-02 DIAGNOSIS — E785 Hyperlipidemia, unspecified: Secondary | ICD-10-CM | POA: Diagnosis present

## 2019-10-02 DIAGNOSIS — K7200 Acute and subacute hepatic failure without coma: Secondary | ICD-10-CM | POA: Diagnosis not present

## 2019-10-02 DIAGNOSIS — R0602 Shortness of breath: Secondary | ICD-10-CM | POA: Diagnosis not present

## 2019-10-02 DIAGNOSIS — E440 Moderate protein-calorie malnutrition: Secondary | ICD-10-CM | POA: Diagnosis not present

## 2019-10-02 DIAGNOSIS — Z4659 Encounter for fitting and adjustment of other gastrointestinal appliance and device: Secondary | ICD-10-CM

## 2019-10-02 DIAGNOSIS — Z789 Other specified health status: Secondary | ICD-10-CM

## 2019-10-02 DIAGNOSIS — N3000 Acute cystitis without hematuria: Secondary | ICD-10-CM | POA: Diagnosis not present

## 2019-10-02 DIAGNOSIS — Z79891 Long term (current) use of opiate analgesic: Secondary | ICD-10-CM

## 2019-10-02 DIAGNOSIS — M1990 Unspecified osteoarthritis, unspecified site: Secondary | ICD-10-CM | POA: Diagnosis present

## 2019-10-02 DIAGNOSIS — R918 Other nonspecific abnormal finding of lung field: Secondary | ICD-10-CM | POA: Diagnosis not present

## 2019-10-02 DIAGNOSIS — T380X5A Adverse effect of glucocorticoids and synthetic analogues, initial encounter: Secondary | ICD-10-CM | POA: Diagnosis present

## 2019-10-02 DIAGNOSIS — F1110 Opioid abuse, uncomplicated: Secondary | ICD-10-CM | POA: Diagnosis present

## 2019-10-02 DIAGNOSIS — J9600 Acute respiratory failure, unspecified whether with hypoxia or hypercapnia: Secondary | ICD-10-CM | POA: Diagnosis not present

## 2019-10-02 DIAGNOSIS — S0990XA Unspecified injury of head, initial encounter: Secondary | ICD-10-CM | POA: Diagnosis not present

## 2019-10-02 DIAGNOSIS — R001 Bradycardia, unspecified: Secondary | ICD-10-CM | POA: Diagnosis not present

## 2019-10-02 DIAGNOSIS — R1011 Right upper quadrant pain: Secondary | ICD-10-CM | POA: Diagnosis not present

## 2019-10-02 DIAGNOSIS — Z4682 Encounter for fitting and adjustment of non-vascular catheter: Secondary | ICD-10-CM | POA: Diagnosis not present

## 2019-10-02 DIAGNOSIS — J181 Lobar pneumonia, unspecified organism: Secondary | ICD-10-CM | POA: Diagnosis not present

## 2019-10-02 DIAGNOSIS — F1090 Alcohol use, unspecified, uncomplicated: Secondary | ICD-10-CM

## 2019-10-02 DIAGNOSIS — J1282 Pneumonia due to coronavirus disease 2019: Secondary | ICD-10-CM | POA: Diagnosis not present

## 2019-10-02 DIAGNOSIS — F319 Bipolar disorder, unspecified: Secondary | ICD-10-CM | POA: Diagnosis present

## 2019-10-02 DIAGNOSIS — K219 Gastro-esophageal reflux disease without esophagitis: Secondary | ICD-10-CM | POA: Diagnosis present

## 2019-10-02 DIAGNOSIS — G4489 Other headache syndrome: Secondary | ICD-10-CM | POA: Diagnosis not present

## 2019-10-02 DIAGNOSIS — Z7901 Long term (current) use of anticoagulants: Secondary | ICD-10-CM

## 2019-10-02 DIAGNOSIS — M109 Gout, unspecified: Secondary | ICD-10-CM | POA: Diagnosis present

## 2019-10-02 DIAGNOSIS — R7989 Other specified abnormal findings of blood chemistry: Secondary | ICD-10-CM | POA: Diagnosis not present

## 2019-10-02 LAB — COMPREHENSIVE METABOLIC PANEL
ALT: 17 U/L (ref 0–44)
AST: 22 U/L (ref 15–41)
Albumin: 3.5 g/dL (ref 3.5–5.0)
Alkaline Phosphatase: 44 U/L (ref 38–126)
Anion gap: 15 (ref 5–15)
BUN: 13 mg/dL (ref 8–23)
CO2: 24 mmol/L (ref 22–32)
Calcium: 9.1 mg/dL (ref 8.9–10.3)
Chloride: 98 mmol/L (ref 98–111)
Creatinine, Ser: 1.18 mg/dL (ref 0.61–1.24)
GFR calc Af Amer: >60 mL/min (ref >60)
GFR calc non Af Amer: >60 mL/min (ref >60)
Glucose, Bld: 94 mg/dL (ref 70–99)
Potassium: 3.9 mmol/L (ref 3.5–5.1)
Sodium: 137 mmol/L (ref 135–145)
Total Bilirubin: 0.8 mg/dL (ref 0.3–1.2)
Total Protein: 7.8 g/dL (ref 6.5–8.1)

## 2019-10-02 LAB — URINALYSIS, ROUTINE W REFLEX MICROSCOPIC
Bilirubin Urine: NEGATIVE
Glucose, UA: NEGATIVE mg/dL
Ketones, ur: 15 mg/dL — AB
Leukocytes,Ua: NEGATIVE
Nitrite: NEGATIVE
Specific Gravity, Urine: 1.02 (ref 1.005–1.030)
pH: 6.5 (ref 5.0–8.0)

## 2019-10-02 LAB — URINALYSIS, MICROSCOPIC (REFLEX)

## 2019-10-02 LAB — CBC WITH DIFFERENTIAL/PLATELET
Abs Immature Granulocytes: 0.05 10*3/uL (ref 0.00–0.07)
Basophils Absolute: 0 10*3/uL (ref 0.0–0.1)
Basophils Relative: 0 %
Eosinophils Absolute: 0 10*3/uL (ref 0.0–0.5)
Eosinophils Relative: 0 %
HCT: 51.4 % (ref 39.0–52.0)
Hemoglobin: 16.2 g/dL (ref 13.0–17.0)
Immature Granulocytes: 1 %
Lymphocytes Relative: 9 %
Lymphs Abs: 0.6 10*3/uL — ABNORMAL LOW (ref 0.7–4.0)
MCH: 28 pg (ref 26.0–34.0)
MCHC: 31.5 g/dL (ref 30.0–36.0)
MCV: 88.8 fL (ref 80.0–100.0)
Monocytes Absolute: 0.4 10*3/uL (ref 0.1–1.0)
Monocytes Relative: 7 %
Neutro Abs: 5.2 10*3/uL (ref 1.7–7.7)
Neutrophils Relative %: 83 %
Platelets: 178 10*3/uL (ref 150–400)
RBC: 5.79 MIL/uL (ref 4.22–5.81)
RDW: 15 % (ref 11.5–15.5)
WBC: 6.3 10*3/uL (ref 4.0–10.5)
nRBC: 0 % (ref 0.0–0.2)

## 2019-10-02 LAB — POC SARS CORONAVIRUS 2 AG -  ED: SARS Coronavirus 2 Ag: POSITIVE — AB

## 2019-10-02 LAB — GLUCOSE, CAPILLARY
Glucose-Capillary: 114 mg/dL — ABNORMAL HIGH (ref 70–99)
Glucose-Capillary: 79 mg/dL (ref 70–99)
Glucose-Capillary: 144 mg/dL — ABNORMAL HIGH (ref 70–99)

## 2019-10-02 LAB — D-DIMER, QUANTITATIVE: D-Dimer, Quant: 0.37 ug/mL-FEU (ref 0.00–0.50)

## 2019-10-02 MED ORDER — IPRATROPIUM-ALBUTEROL 20-100 MCG/ACT IN AERS
1.0000 | INHALATION_SPRAY | Freq: Four times a day (QID) | RESPIRATORY_TRACT | Status: DC
  Administered 2019-10-02 – 2019-10-23 (×67): 1 via RESPIRATORY_TRACT
  Filled 2019-10-02: qty 4

## 2019-10-02 MED ORDER — GABAPENTIN 100 MG PO CAPS
200.0000 mg | ORAL_CAPSULE | Freq: Two times a day (BID) | ORAL | Status: DC
  Administered 2019-10-02 – 2019-10-04 (×5): 200 mg via ORAL
  Filled 2019-10-02 (×6): qty 2

## 2019-10-02 MED ORDER — ADULT MULTIVITAMIN W/MINERALS CH
1.0000 | ORAL_TABLET | Freq: Every day | ORAL | Status: DC
  Administered 2019-10-03 – 2019-10-18 (×16): 1 via ORAL
  Filled 2019-10-02 (×15): qty 1

## 2019-10-02 MED ORDER — METOCLOPRAMIDE HCL 10 MG PO TABS
10.0000 mg | ORAL_TABLET | Freq: Three times a day (TID) | ORAL | Status: DC
  Administered 2019-10-03 – 2019-10-05 (×7): 10 mg via ORAL
  Filled 2019-10-02 (×9): qty 1

## 2019-10-02 MED ORDER — TAMSULOSIN HCL 0.4 MG PO CAPS
0.4000 mg | ORAL_CAPSULE | Freq: Every day | ORAL | Status: DC
  Administered 2019-10-02 – 2019-10-31 (×28): 0.4 mg via ORAL
  Filled 2019-10-02 (×29): qty 1

## 2019-10-02 MED ORDER — VITAMIN C 500 MG PO TABS
500.0000 mg | ORAL_TABLET | Freq: Every day | ORAL | Status: DC
  Administered 2019-10-02 – 2019-10-18 (×17): 500 mg via ORAL
  Filled 2019-10-02 (×17): qty 1

## 2019-10-02 MED ORDER — LOSARTAN POTASSIUM 25 MG PO TABS: 100.0000 mg | ORAL_TABLET | Freq: Every day | ORAL | Status: DC

## 2019-10-02 MED ORDER — DULOXETINE HCL 30 MG PO CPEP
30.0000 mg | ORAL_CAPSULE | Freq: Two times a day (BID) | ORAL | Status: DC
  Administered 2019-10-02 – 2019-10-04 (×5): 30 mg via ORAL
  Filled 2019-10-02 (×6): qty 1

## 2019-10-02 MED ORDER — NITROGLYCERIN 0.4 MG SL SUBL: 0.4000 mg | SUBLINGUAL_TABLET | SUBLINGUAL | Status: DC | PRN

## 2019-10-02 MED ORDER — DEXTROSE 50 % IV SOLN
INTRAVENOUS | Status: AC
  Filled 2019-10-02: qty 50

## 2019-10-02 MED ORDER — ALLOPURINOL 100 MG PO TABS
300.0000 mg | ORAL_TABLET | Freq: Every day | ORAL | Status: DC
  Administered 2019-10-02 – 2019-10-18 (×17): 300 mg via ORAL
  Filled 2019-10-02 (×16): qty 3

## 2019-10-02 MED ORDER — FAMOTIDINE 20 MG PO TABS
20.0000 mg | ORAL_TABLET | Freq: Two times a day (BID) | ORAL | Status: DC
  Administered 2019-10-02 – 2019-10-04 (×4): 20 mg via ORAL
  Filled 2019-10-02 (×4): qty 1

## 2019-10-02 MED ORDER — ACETAMINOPHEN 325 MG PO TABS
650.0000 mg | ORAL_TABLET | Freq: Four times a day (QID) | ORAL | Status: DC | PRN
  Administered 2019-10-02 – 2019-10-18 (×7): 650 mg via ORAL
  Filled 2019-10-02 (×7): qty 2

## 2019-10-02 MED ORDER — MAGNESIUM OXIDE -MG SUPPLEMENT 400 (240 MG) MG PO TABS
200.0000 mg | ORAL_TABLET | Freq: Every day | ORAL | Status: DC
  Administered 2019-10-02 – 2019-10-18 (×16): 200 mg via ORAL
  Filled 2019-10-02 (×29): qty 1

## 2019-10-02 MED ORDER — INSULIN ASPART 100 UNIT/ML ~~LOC~~ SOLN
0.0000 [IU] | Freq: Every day | SUBCUTANEOUS | Status: DC
  Administered 2019-10-05: 2 [IU] via SUBCUTANEOUS

## 2019-10-02 MED ORDER — APIXABAN 5 MG PO TABS
5.0000 mg | ORAL_TABLET | Freq: Two times a day (BID) | ORAL | Status: DC
  Administered 2019-10-02 – 2019-10-18 (×32): 5 mg via ORAL
  Filled 2019-10-02 (×32): qty 1

## 2019-10-02 MED ORDER — SENNOSIDES-DOCUSATE SODIUM 8.6-50 MG PO TABS
1.0000 | ORAL_TABLET | Freq: Every evening | ORAL | Status: DC | PRN
  Filled 2019-10-02: qty 1

## 2019-10-02 MED ORDER — DEXAMETHASONE SODIUM PHOSPHATE 10 MG/ML IJ SOLN
6.0000 mg | INTRAMUSCULAR | Status: DC
  Administered 2019-10-02: 6 mg via INTRAVENOUS
  Filled 2019-10-02: qty 1

## 2019-10-02 MED ORDER — ZINC SULFATE 220 (50 ZN) MG PO CAPS
220.0000 mg | ORAL_CAPSULE | Freq: Every day | ORAL | Status: DC
  Administered 2019-10-02 – 2019-10-04 (×3): 220 mg via ORAL
  Filled 2019-10-02 (×2): qty 1

## 2019-10-02 MED ORDER — INSULIN ASPART 100 UNIT/ML ~~LOC~~ SOLN
0.0000 [IU] | Freq: Three times a day (TID) | SUBCUTANEOUS | Status: DC
  Administered 2019-10-03: 3 [IU] via SUBCUTANEOUS
  Administered 2019-10-03: 17:00:00 7 [IU] via SUBCUTANEOUS
  Administered 2019-10-03: 5 [IU] via SUBCUTANEOUS
  Administered 2019-10-04 (×2): 3 [IU] via SUBCUTANEOUS
  Administered 2019-10-04: 17:00:00 1 [IU] via SUBCUTANEOUS
  Administered 2019-10-05: 3 [IU] via SUBCUTANEOUS
  Administered 2019-10-05: 1 [IU] via SUBCUTANEOUS
  Administered 2019-10-06: 08:00:00 5 [IU] via SUBCUTANEOUS
  Administered 2019-10-08: 1 [IU] via SUBCUTANEOUS
  Administered 2019-10-09 (×2): 2 [IU] via SUBCUTANEOUS
  Administered 2019-10-10: 1 [IU] via SUBCUTANEOUS
  Administered 2019-10-10 (×2): 2 [IU] via SUBCUTANEOUS

## 2019-10-02 MED ORDER — ROSUVASTATIN CALCIUM 20 MG PO TABS
40.0000 mg | ORAL_TABLET | Freq: Every day | ORAL | Status: DC
  Administered 2019-10-02 – 2019-10-04 (×3): 40 mg via ORAL
  Filled 2019-10-02 (×3): qty 2

## 2019-10-02 MED ORDER — OXYCODONE HCL 5 MG PO TABS
2.5000 mg | ORAL_TABLET | Freq: Three times a day (TID) | ORAL | Status: DC
  Administered 2019-10-02 – 2019-10-05 (×8): 2.5 mg via ORAL
  Filled 2019-10-02 (×8): qty 1

## 2019-10-02 MED ORDER — DIVALPROEX SODIUM 500 MG PO DR TAB
500.0000 mg | DELAYED_RELEASE_TABLET | Freq: Every morning | ORAL | Status: DC
  Administered 2019-10-03 – 2019-10-18 (×15): 500 mg via ORAL
  Filled 2019-10-02 (×19): qty 1

## 2019-10-02 MED ORDER — AMLODIPINE BESYLATE 5 MG PO TABS
5.0000 mg | ORAL_TABLET | Freq: Every day | ORAL | Status: DC
  Administered 2019-10-03 – 2019-10-04 (×2): 5 mg via ORAL
  Filled 2019-10-02 (×2): qty 1

## 2019-10-02 MED ORDER — CALCIUM-VITAMIN D 500-200 MG-UNIT PO TABS
1.0000 | ORAL_TABLET | Freq: Every day | ORAL | Status: DC
  Administered 2019-10-02 – 2019-10-18 (×17): 1 via ORAL
  Filled 2019-10-02 (×20): qty 1

## 2019-10-02 MED ORDER — ACETAMINOPHEN 325 MG PO TABS
650.0000 mg | ORAL_TABLET | Freq: Once | ORAL | Status: AC
  Administered 2019-10-02: 650 mg via ORAL
  Filled 2019-10-02: qty 2

## 2019-10-02 MED ORDER — VITAMIN B-1 100 MG PO TABS
100.0000 mg | ORAL_TABLET | Freq: Every day | ORAL | Status: DC
  Administered 2019-10-03 – 2019-10-05 (×3): 100 mg via ORAL
  Filled 2019-10-02 (×4): qty 1

## 2019-10-02 MED ORDER — OXYCODONE-ACETAMINOPHEN 7.5-325 MG PO TABS: 1.0000 | ORAL_TABLET | Freq: Three times a day (TID) | ORAL | Status: DC

## 2019-10-02 MED ORDER — ONDANSETRON HCL 4 MG PO TABS: 4.0000 mg | ORAL_TABLET | Freq: Four times a day (QID) | ORAL | Status: DC | PRN

## 2019-10-02 MED ORDER — LOSARTAN POTASSIUM 25 MG PO TABS
100.0000 mg | ORAL_TABLET | Freq: Every day | ORAL | Status: DC
  Administered 2019-10-02 – 2019-10-04 (×3): 100 mg via ORAL
  Filled 2019-10-02 (×3): qty 4

## 2019-10-02 MED ORDER — DEXTROSE 50 % IV SOLN
INTRAVENOUS | Status: AC
  Administered 2019-10-02: 19:00:00
  Filled 2019-10-02: qty 50

## 2019-10-02 MED ORDER — ONDANSETRON HCL 4 MG/2ML IJ SOLN: 4.0000 mg | Freq: Four times a day (QID) | INTRAMUSCULAR | Status: DC | PRN

## 2019-10-02 MED ORDER — DIVALPROEX SODIUM 500 MG PO DR TAB
1000.0000 mg | DELAYED_RELEASE_TABLET | Freq: Every day | ORAL | Status: DC
  Administered 2019-10-02 – 2019-10-18 (×16): 1000 mg via ORAL
  Filled 2019-10-02 (×20): qty 2

## 2019-10-02 MED ORDER — ASPIRIN EC 81 MG PO TBEC
81.0000 mg | DELAYED_RELEASE_TABLET | Freq: Every day | ORAL | Status: DC
  Administered 2019-10-02 – 2019-10-18 (×17): 81 mg via ORAL
  Filled 2019-10-02 (×17): qty 1

## 2019-10-02 MED ORDER — OXYCODONE-ACETAMINOPHEN 5-325 MG PO TABS
1.0000 | ORAL_TABLET | Freq: Three times a day (TID) | ORAL | Status: DC
  Administered 2019-10-02 – 2019-10-05 (×8): 1 via ORAL
  Filled 2019-10-02 (×8): qty 1

## 2019-10-02 MED ORDER — LACOSAMIDE 50 MG PO TABS
100.0000 mg | ORAL_TABLET | Freq: Two times a day (BID) | ORAL | Status: DC
  Administered 2019-10-02 – 2019-10-05 (×6): 100 mg via ORAL
  Filled 2019-10-02 (×5): qty 2

## 2019-10-02 MED ORDER — FOLIC ACID 1 MG PO TABS
1.0000 mg | ORAL_TABLET | Freq: Every day | ORAL | Status: DC
  Administered 2019-10-03 – 2019-10-18 (×16): 1 mg via ORAL
  Filled 2019-10-02 (×16): qty 1

## 2019-10-02 NOTE — H&P (Addendum)
History and Physical    Russell Carter KZS:[id number] DOB: 07/24/1956 DOA: 10/20/2019  PCP: Ladell Pier, MD  Patient coming from: Home  I have personally briefly reviewed patient's old medical records in [place]  Chief Complaint: SOB  HPI: Russell Carter is a 63 y.o. male with medical history significant of CAD, osteoarthritis, polysubstance abuse, chronic anticoagulation, diabetes, cocaine abuse, hyperlipidemia, cervical stenosis, anxiety disorder, hypertension, chronic kidney disease, seizure disorder, history of CVA, and bipolar disorder who was recently admitted with COVID pneumonia, who returns with worsening symptoms.  Patient reports he was recently discharged on 12/8 and states that since admission and through hospitalization he has never really felt better.  He feels as though his headaches, sob, fevers have all worsened in the past few days since he returned home.  He states he has anosmia, diarrhea but he feels extremely tired and winded with limited activity.  He reports he could probably 1-2 blocks before tiring out.  He states he is having a productive cough but he did previously as well.  During his recent hospitalization he did receive Remdesivir and steroids, and they had documented patient was symptoms free on room air for three days prior to discharge home.  Patient reports former smoker and alcohol, no current use, no current drug use  He ambulates with a cane  He lives at home with his fiancee and her children   Review of Systems: As per HPI otherwise 10 point review of systems negative.   Past Medical History:  Diagnosis Date   Acute kidney injury ([place]) 04/27/2018   AKI (acute kidney injury) ([place])    Anginal pain ([place])    Anxiety    Arthritis    Bipolar disorder ([place])    Blood in stool    CAD (coronary artery disease)    a. 2011: cath showing mod non-obstructive disease b. 02/2016: cath with 95% stenosis in the Mid RCA. DES placed.    Cervical stenosis of spine    Chronic anticoagulation 04/27/2018   Chronic pain disorder 06/05/2017   CKD (chronic kidney disease), stage III 05/06/2018   CKD (chronic kidney disease), stage III 05/06/2018   Cocaine abuse ([place])    history   Complete heart block ([place]) 03/14/2016   Depression    Diabetes mellitus type 2 in nonobese [facility])    Diabetes mellitus without complication ([place])    Dyslipidemia 03/15/2016   Essential hypertension 05/18/2009   Qualifier: Diagnosis of  By: Burnett Kanaris     Gastroesophageal reflux disease without esophagitis 07/31/2017   Gout, unspecified    Headache    History of CVA (cerebrovascular accident)    History of pulmonary embolus (PE) 02/14/2017   HTN (hypertension)    Hypercholesterolemia    Hyperglycemia    Leukopenia 05/06/2018   Migraines    Myocardial infarction [facility]) 2008   drug cocaine and marijuana at time of mi none  since     Normocytic anemia 05/06/2018   Pneumonia    Polysubstance abuse ([place]) 01/15/2013   Rectal bleeding    Renal insufficiency    Seizure ([place]) 05/06/2018   Seizures ([employer])    Stroke ([place])    Vertigo     Past Surgical History:  Procedure Laterality Date   ANTERIOR CERVICAL DECOMP/DISCECTOMY FUSION N/A 01/24/2017   Procedure: Cervical Four-Five,Cervical Five Six,Cervical Six-Seven Anterior cervical discectomy with fusion and plate with Cervical Three-Thoracic- One Laminectomy/Foraminotomy with Cervical Three-Thoracic- One Fixation and fusion;  Surgeon: Kevan Ny Ditty, MD;  Location:  [facility] OR;  Service: Neurosurgery;  Laterality: N/A;  Part-1 anterior approach   CARDIAC CATHETERIZATION  2008; ~ 2011   [place]-40%lad   CARDIAC CATHETERIZATION N/A 03/16/2016   Procedure: Left Heart Cath and Coronary Angiography;  Surgeon: Leonie Man, MD;  Location: [place] CV LAB;  Service: Cardiovascular;  Laterality: N/A;   COLONOSCOPY WITH PROPOFOL N/A 04/17/2018   Procedure: COLONOSCOPY WITH  PROPOFOL;  Surgeon: Wonda Horner, MD;  Location: [facility] ENDOSCOPY;  Service: Endoscopy;  Laterality: N/A;   CORONARY ANGIOPLASTY     STENTS   EYE SURGERY Bilateral    "for double vision"   INGUINAL HERNIA REPAIR  05/12/2012   Procedure: HERNIA REPAIR INGUINAL ADULT;  Surgeon: Zenovia Jarred, MD;  Location: [facility];  Service: General;  Laterality: Right;   INGUINAL HERNIA REPAIR Left 02/02/2003   Dr. Georganna Skeans. repair with mesh. Same procedure done again in  05/22/2004    INGUINAL HERNIA REPAIR Left ?date 2nd OR   LEFT HEART CATH AND CORONARY ANGIOGRAPHY N/A 04/29/2018   Procedure: LEFT HEART CATH AND CORONARY ANGIOGRAPHY;  Surgeon: Martinique, Peter M, MD;  Location: [place] CV LAB;  Service: Cardiovascular;  Laterality: N/A;   POSTERIOR CERVICAL FUSION/FORAMINOTOMY N/A 01/24/2017   Procedure: Cervical Three-Thoracic One-Laminectomy/Foraminotomy with Cervical Three-Thoracic One Fixation and fusion;  Surgeon: Kevan Ny Ditty, MD;  Location: [place];  Service: Neurosurgery;  Laterality: N/A;  Part-2 Posterior approach   RECONSTRUCT / STABILIZE DISTAL ULNA     SHOULDER OPEN ROTATOR CUFF REPAIR Left 2010   SHOULDER SURGERY Left    "injured on the job; had to cut small bone out"   THUMB FUSION Right      reports that he has never smoked. He has never used smokeless tobacco. He reports previous alcohol use. He reports previous drug use. Drugs: "Crack" cocaine, Marijuana, and Cocaine.  Allergies  Allergen Reactions   Shellfish Allergy Anaphylaxis    Family History  Problem Relation Age of Onset   Heart attack Mother        x7. Still alive   Heart attack Father        deceased b/c of MI   Heart attack Brother        older, deceased b/c of MI    Cancer Brother        bone cancer in 1 brother, ? brain cancer in other brother   Heart attack Brother    Other Brother        MVA   Other Brother    Bone cancer Brother    Cancer Brother    SIDS  Sister     Prior to Admission medications   Medication Sig Start Date End Date Taking? Authorizing Provider  allopurinol (ZYLOPRIM) 300 MG tablet TAKE 1 TABLET (300 MG TOTAL) BY MOUTH DAILY. 08/28/19  Yes Ladell Pier, MD  amLODipine (NORVASC) 5 MG tablet Take 1 tablet (5 mg total) by mouth daily. 12/18/18  Yes Ladell Pier, MD  apixaban (ELIQUIS) 5 MG TABS tablet Take 1 tablet (5 mg total) by mouth 2 (two) times daily. 09/07/19  Yes Elsie Stain, MD  aspirin EC 81 MG tablet Take 1 tablet (81 mg total) by mouth daily. 12/26/18 12/26/19 Yes Vonzella Nipple, NP  Calcium Carb-Cholecalciferol (CALCIUM-VITAMIN D) 600-400 MG-UNIT TABS Take 1 tablet by mouth daily. 05/07/18  Yes Emokpae, Courage, MD  chlordiazePOXIDE (LIBRIUM) 10 MG capsule Take one capsule three times day for one week, then one  capsule twice a day for one week then one capsule daily until supply is out. 09/07/19  Yes Elsie Stain, MD  cyclobenzaprine (FLEXERIL) 10 MG tablet Take 10 mg by mouth 3 (three) times daily. 08/26/19  Yes [provider]  divalproex (DEPAKOTE) 500 MG DR tablet 1 tablet in the morning, 2 in the evening Patient taking differently: Take 500-1,000 mg by mouth See admin instructions. Take 1 tablet in the morning and 2 in the evening 06/02/19  Yes Kathrynn Ducking, MD  DULoxetine (CYMBALTA) 30 MG capsule TAKE 1 CAPSULE (30 MG TOTAL) BY MOUTH 2 (TWO) TIMES DAILY. 08/28/19  Yes Ladell Pier, MD  famotidine (PEPCID) 20 MG tablet Take 20 mg by mouth 2 (two) times daily. 08/26/19  Yes [provider]  folic acid (FOLVITE) 1 MG tablet Take 1 tablet (1 mg total) by mouth daily. 05/07/18  Yes Emokpae, Courage, MD  gabapentin (NEURONTIN) 100 MG capsule Take 2 capsules (200 mg total) by mouth 2 (two) times daily. 05/15/19 04/04/20 Yes Domenic Polite, MD  Lacosamide 100 MG TABS Take 1 tablet (100 mg total) by mouth 2 (two) times daily. 09/07/19 10/07/19 Yes Elsie Stain, MD  losartan  (COZAAR) 100 MG tablet TAKE 1 TABLET (100 MG TOTAL) BY MOUTH DAILY. 08/28/19  Yes Ladell Pier, MD  metoCLOPramide (REGLAN) 10 MG tablet Take 10 mg by mouth 3 (three) times daily before meals.  08/28/19  Yes [provider]  Multiple Vitamin (MULTIVITAMIN WITH MINERALS) TABS tablet Take 1 tablet by mouth daily. 05/07/18  Yes Roxan Hockey, MD  NARCAN 4 MG/0.1ML LIQD nasal spray kit Place 1 spray into the nose once.  02/12/19  Yes [provider]  nitroGLYCERIN (NITROSTAT) 0.4 MG SL tablet Place 1 tablet (0.4 mg total) under the tongue every 5 (five) minutes as needed for chest pain. 02/20/19  Yes Ladell Pier, MD  oxyCODONE-acetaminophen (PERCOCET) 7.5-325 MG tablet Take 1 tablet by mouth 3 (three) times daily. 12/26/18 12/26/19 Yes Vonzella Nipple, NP  rosuvastatin (CRESTOR) 40 MG tablet Take 1 tablet (40 mg total) by mouth daily. 04/21/19  Yes Ladell Pier, MD  tamsulosin (FLOMAX) 0.4 MG CAPS capsule TAKE 1 CAPSULE BY MOUTH AT BEDTIME. Patient taking differently: Take 0.4 mg by mouth at bedtime.  08/28/19  Yes Ladell Pier, MD  testosterone cypionate (DEPOTESTOSTERONE CYPIONATE) 200 MG/ML injection Inject 200 mg into the muscle every 30 (thirty) days. 08/23/19  Yes [provider]  thiamine (VITAMIN B-1) 100 MG tablet Take 1 tablet (100 mg total) by mouth daily. 09/07/19  Yes Elsie Stain, MD  Vitamin D, Ergocalciferol, (DRISDOL) 1.25 MG (50000 UT) CAPS capsule Take 50,000 Units by mouth every Sunday.    Yes [provider]  [employer] MAGNESIUM OXIDE 250 MG TABS Take 250 mg by mouth at bedtime. 09/22/19   [provider]    Physical Exam: Vitals:   10/20/2019 0819 10/15/2019 0900 09/24/2019 1100  BP: (!) 164/93 (!) 154/77 (!) 150/89  Pulse: 93  81  Resp: (!) 24  (!) 33  Temp: 100.1 F (37.8 C)    TempSrc: Oral    SpO2: 92%  94%     Vitals:   10/13/2019 0819 10/22/2019 0900 09/29/2019 1100  BP: (!) 164/93 (!) 154/77 (!) 150/89  Pulse:  93  81  Resp: (!) 24  (!) 33  Temp: 100.1 F (37.8 C)    TempSrc: Oral    SpO2: 92%  94%   Constitutional:  appears tired, otherwise no acute distress Eyes: EOMI, anicteric sclera ENMT: Mucous membranes are moist. Posterior pharynx clear of any exudate or lesions.Normal dentition.  Neck: normal, supple, no masses, no thyromegaly Respiratory: bilateral crackles, no wheezing, tachypneic, no accessory muscle use Cardiovascular: Regular rate and rhythm, no murmurs / rubs / gallops. No extremity edema. 2+ pedal pulses. No carotid bruits.  Abdomen: no tenderness, no masses palpated. No hepatosplenomegaly. Bowel sounds positive.  Musculoskeletal: no clubbing / cyanosis. No joint deformity upper and lower extremities. Good ROM, no contractures. Normal muscle tone.  Skin: no rashes, lesions, ulcers. No induration Neurologic: CN 2-12 grossly intact. Sensation intact, DTR normal. Strength 5/5 in all 4.  Psychiatric: AAOx3, pleasant, mood/affect congruent.   Labs on Admission: I have personally reviewed following labs and imaging studies  CBC: Recent Labs  Lab 09/25/19 2100 09/25/19 2143 09/26/19 0015 09/27/19 0500 09/28/19 0117 10/06/2019 0915  WBC 5.8  --  5.0 2.9* 6.0 6.3  NEUTROABS 4.3  --  3.7 2.0 4.5 5.2  HGB 14.7 14.6 13.5 13.6 13.8 16.2  HCT 46.6 43.0 42.9 42.7 43.8 51.4  MCV 90.0  --  89.9 89.5 88.8 88.8  PLT 152  --  139* 143* 206 956   Basic Metabolic Panel: Recent Labs  Lab 09/25/19 2100 09/25/19 2143 09/26/19 0015 09/27/19 0500 09/28/19 0117 09/28/2019 0915  NA 136 134* 136 138 139 137  K 4.1 3.7 4.1 4.7 4.4 3.9  CL 100  --  103 110 107 98  CO2 25  --  22 21* 22 24  GLUCOSE 101*  --  99 181* 139* 94  BUN 15  --  _0 CREATININE 1.50*  --  1.38* 1.03 1.13 1.18  CALCIUM 8.8*  --  8.4* 7.8* 8.2* 9.1  MG  --   --   --   --  2.1  --    GFR: Estimated Creatinine Clearance: 74.5 mL/min (by C-G formula based on SCr of 1.18 mg/dL). Liver Function Tests: Recent  Labs  Lab 09/25/19 2100 09/26/19 0015 09/27/19 0500 09/28/19 0117 10/04/2019 0915  AST 71* 55* _1 ALT 47* 40 _2 ALKPHOS 39 38 34* 39 44  BILITOT 0.8 0.7 0.6 0.3 0.8  PROT 6.8 6.1* 5.7* 6.6 7.8  ALBUMIN 3.8 3.4* 2.8* 3.3* 3.5   No results for input(s): LIPASE, AMYLASE in the last 168 hours. No results for input(s): AMMONIA in the last 168 hours. Coagulation Profile: No results for input(s): INR, PROTIME in the last 168 hours. Cardiac Enzymes: No results for input(s): CKTOTAL, CKMB, CKMBINDEX, TROPONINI in the last 168 hours. BNP (last 3 results) No results for input(s): PROBNP in the last 8760 hours. HbA1C: No results for input(s): HGBA1C in the last 72 hours. CBG: Recent Labs  Lab 09/28/19 1149 09/28/19 1628 09/28/19 1938 09/29/19 0746 09/29/19 1125  GLUCAP 150* 145* 154* 218* 172*   Lipid Profile: No results for input(s): CHOL, HDL, LDLCALC, TRIG, CHOLHDL, LDLDIRECT in the last 72 hours. Thyroid Function Tests: No results for input(s): TSH, T4TOTAL, FREET4, T3FREE, THYROIDAB in the last 72 hours. Anemia Panel: No results for input(s): VITAMINB12, FOLATE, FERRITIN, TIBC, IRON, RETICCTPCT in the last 72 hours. Urine analysis:    Component Value Date/Time   COLORURINE YELLOW 10/11/2019 0915   APPEARANCEUR CLEAR 09/24/2019 0915   LABSPEC 1.020 10/17/2019 0915   PHURINE 6.5 09/28/2019 0915   GLUCOSEU NEGATIVE 10/06/2019 0915   HGBUR TRACE (A) 10/11/2019 0915  BILIRUBINUR NEGATIVE 10/13/2019 0915   BILIRUBINUR negative 09/08/2018 1640   KETONESUR 15 (A) 09/25/2019 0915   PROTEINUR TRACE (A) 10/01/2019 0915   UROBILINOGEN 2.0 (A) 09/08/2018 1640   UROBILINOGEN 1.0 02/04/2015 0647   NITRITE NEGATIVE 09/27/2019 0915   LEUKOCYTESUR NEGATIVE 10/11/2019 0915    Radiological Exams on Admission: CT Head Wo Contrast  Result Date: 10/12/2019 CLINICAL DATA:  63 year old male with 1 week of headache. Two days of shortness of breath. Positive for COVID-19.  EXAM: CT HEAD WITHOUT CONTRAST TECHNIQUE: Contiguous axial images were obtained from the base of the skull through the vertex without intravenous contrast. COMPARISON:  09/25/2019 head CT. Brain MRI 12/25/2018 FINDINGS: Brain: Stable cerebral volume. No midline shift, ventriculomegaly, mass effect, evidence of mass lesion, intracranial hemorrhage or evidence of cortically based acute infarction. Stable gray-white matter differentiation with mild periventricular white matter hypodensity, stable compared to the March MRI. Vascular: Mild Calcified atherosclerosis at the skull base. No suspicious intracranial vascular hyperdensity. Skull: Negative. Sinuses/Orbits: Visualized paranasal sinuses and mastoids are stable and well pneumatized. Other: Mildly Disconjugate gaze, otherwise negative orbits. No acute scalp soft tissue finding. IMPRESSION: Stable largely negative for age non contrast CT appearance of the brain. No acute intracranial abnormality. Electronically Signed   By: Genevie Ann M.D.   On: 09/29/2019 11:34   DG Chest Portable 1 View  Result Date: 10/03/2019 CLINICAL DATA:  Shortness of breath. Additional provided: Headache for 1 week, shortness of breath for 2 days. COVID (+) 10 days ago. EXAM: PORTABLE CHEST 1 VIEW COMPARISON:  Chest radiograph 09/25/2019 FINDINGS: Mild cardiomegaly. Redemonstrated gaseous distention of the stomach beneath an elevated left hemidiaphragm. Associated left basilar atelectasis. Ill-defined airspace opacity within the mid to lower right lung likely reflecting pneumonia given provided history. No sizable pleural effusion or evidence of pneumothorax. No acute bony abnormality. Partially visualized cervical spinal fusion hardware. IMPRESSION: Ill-defined airspace opacity within the mid to lower right lung, likely reflecting pneumonia given provided history. Redemonstrated gaseous distention of the stomach beneath and elevated left hemidiaphragm. Associated left basilar atelectasis.  Mild cardiomegaly. Electronically Signed   By: Kellie Simmering DO   On: 09/22/2019 10:02    EKG: Independently reviewed.   Assessment/Plan Russell Carter is a 63 y.o. male with medical history significant of CAD, osteoarthritis, polysubstance abuse, chronic anticoagulation, diabetes, cocaine abuse, hyperlipidemia, cervical stenosis, anxiety disorder hypertension, chronic kidney disease, seizure disorder, history of CVA, and bipolar disorder who was recently admitted with COVID pneumonia, who returns with worsening symptoms and acute Hypoxemic respiratory failure.  # Acute Hypoxemic Respiratory Failure # COVID 19 Pneumonia - patient with worsening symptoms, now on 4L of oxygen supplementation.  Patient recently completed course of remdesivir and dexamethasone. - suspect this is all lingering effects of COVID, given absence of leukocytosis, otherwise unremarkable labs, normal D-Dimer and patient has been on apixaban so suspicion for PE is low - will obtain Strep/Legionella ur Ag, MRSA Screen, Resp viral panel - order steroids, monitor inflammatory markers, inhalers and continue oxygen supplementation  # Coronary Artery Disease - no chest pain, angina - continue aspirin, rosuvastatin  # Hx of CVA - on apixaban, rosuvastatin, no acute issues  # T2DM - most recent A1c 6.3 (07/2019) - continue ISS and accuchecks - continue gabapentin  # Gout - continue allopurinol  # Bipolar Disorder - continued Depakote 500 mg qAM, 1 g qHS - continue duloxetine 30 mg BID  # HTN - continue amlodipine and losartan  # BPH - continue tamsulosin  DVT prophylaxis: Apixaban Code Status: Full Disposition Plan: Admit to [place] Admission status: inpatient   Truddie Hidden MD [facility] Pager [phone]  If 7PM-7AM, please contact night-coverage [email or web address] Password [email or web address]  10/09/2019, 1:03 PM

## 2019-10-02 NOTE — ED Triage Notes (Signed)
Per EMS, pt complains of headache x 1 week, shortness of breath x 2 days. Pt was diagnosed with COVID 19 10 days ago and was at [facility] for 4 days. Pt was 87% on RA, placed on 4 L went up to 100%.  BP 172/100 HR 90s, regular Temp 97.9 RR 20

## 2019-10-02 NOTE — ED Provider Notes (Signed)
[facility] DEPT Provider Note   CSN: [id number] Arrival date & time: 09/30/2019  0756     History Chief Complaint  Patient presents with  . Shortness of Breath  . Headache    Russell Carter is a 63 y.o. male with past medical history significant for CAD, CKD stage 3, type 2 diabetes, CVA with residual left sided weakeness, PE on eliquis, seizures presents to emergency department today via EMS with chief complaint of headache and shortness of breath x 2 days.  He states this headache feels like headaches he has had in the past. Headache has progressively worsened since onset, denies sudden onset.  He is also reporting shortness of breath that has been constant.  He states he has at rest and with ambulation.  He is unable to walk more than a few steps from having to stop and catch his breath.  This is abnormal for him. On EMS arrival SpO2 was 89%. He was put on 4L nasal cannula with improvement to 100%.   Denies syncope, head trauma, photophobia, phonophobia, UL throbbing, N/V, visual changes, stiff neck, neck pain, rash, or "thunderclap" onset,  chest pain, cough, abdominal pain, nausea, vomiting, urinary symptoms, diarrhea.  Patient tested positive for Covid on 09/2019.  He was admitted to [facility] for 4 days.  He was not discharged home with oxygen. Also of importance PCP note from 09/07/19 with documentation of patient starting eliquis and discontinuing warfarin.  Past Medical History:  Diagnosis Date  . Acute kidney injury ([place]) 04/27/2018  . AKI (acute kidney injury) ([place])   . Anginal pain ([place])   . Anxiety   . Arthritis   . Bipolar disorder ([place])   . Blood in stool   . CAD (coronary artery disease)    a. 2011: cath showing mod non-obstructive disease b. 02/2016: cath with 95% stenosis in the Mid RCA. DES placed.  . Cervical stenosis of spine   . Chronic anticoagulation 04/27/2018  . Chronic pain disorder 06/05/2017  . CKD (chronic kidney  disease), stage III 05/06/2018  . CKD (chronic kidney disease), stage III 05/06/2018  . Cocaine abuse ([place])    history  . Complete heart block ([place]) 03/14/2016  . Depression   . Diabetes mellitus type 2 in nonobese (HCC)   . Diabetes mellitus without complication ([place])   . Dyslipidemia 03/15/2016  . Essential hypertension 05/18/2009   Qualifier: Diagnosis of  By: Burnett Kanaris    . Gastroesophageal reflux disease without esophagitis 07/31/2017  . Gout, unspecified   . Headache   . History of CVA (cerebrovascular accident)   . History of pulmonary embolus (PE) 02/14/2017  . HTN (hypertension)   . Hypercholesterolemia   . Hyperglycemia   . Leukopenia 05/06/2018  . Migraines   . Myocardial infarction [facility]) 2008   drug cocaine and marijuana at time of mi none  since    . Normocytic anemia 05/06/2018  . Pneumonia   . Polysubstance abuse ([place]) 01/15/2013  . Rectal bleeding   . Renal insufficiency   . Seizure ([place]) 05/06/2018  . Seizures ([place])   . Stroke ([place])   . Vertigo     Patient Active Problem List   Diagnosis Date Noted  . COVID-19 virus infection 09/27/2019  . Pneumonia due to COVID-19 virus 09/25/2019  . Alcohol use 09/07/2019  . Benign prostatic hyperplasia (BPH) with straining on urination   . History of stroke without residual deficits 12/24/2018  . Seizure ([place]) 05/06/2018  . Normocytic anemia  05/06/2018  . Chronic anticoagulation 04/27/2018  . Sensorineural hearing loss (SNHL), bilateral 04/15/2018  . Left knee pain 01/10/2018  . Syncope 09/04/2017  . Numbness and tingling in both hands 07/31/2017  . Gastroesophageal reflux disease without esophagitis 07/31/2017  . Chronic pain disorder 06/05/2017  . History of pulmonary embolus (PE) 02/14/2017  . Diabetes mellitus type 2 in nonobese (HCC)   . Cocaine abuse ([place])   . Depression   . History of CVA (cerebrovascular accident)   . Cervical spondylosis with radiculopathy 01/24/2017  . Dyslipidemia 03/15/2016  .  Cervical spinal stenosis 03/14/2016  . CAD-50% LAD 2011 12/14/2014  . Gout 11/16/2014  . Polysubstance abuse ([place]) 01/15/2013  . DIZZINESS 06/05/2010  . Essential hypertension 05/18/2009    Past Surgical History:  Procedure Laterality Date  . ANTERIOR CERVICAL DECOMP/DISCECTOMY FUSION N/A 01/24/2017   Procedure: Cervical Four-Five,Cervical Five Six,Cervical Six-Seven Anterior cervical discectomy with fusion and plate with Cervical Three-Thoracic- One Laminectomy/Foraminotomy with Cervical Three-Thoracic- One Fixation and fusion;  Surgeon: Kevan Ny Ditty, MD;  Location: [facility];  Service: Neurosurgery;  Laterality: N/A;  Part-1 anterior approach  . CARDIAC CATHETERIZATION  2008; ~ 2011   [place]-40%lad  . CARDIAC CATHETERIZATION N/A 03/16/2016   Procedure: Left Heart Cath and Coronary Angiography;  Surgeon: Leonie Man, MD;  Location: [place] [facility] CV LAB;  Service: Cardiovascular;  Laterality: N/A;  . COLONOSCOPY WITH PROPOFOL N/A 04/17/2018   Procedure: COLONOSCOPY WITH PROPOFOL;  Surgeon: Wonda Horner, MD;  Location: [facility] ENDOSCOPY;  Service: Endoscopy;  Laterality: N/A;  . CORONARY ANGIOPLASTY     STENTS  . EYE SURGERY Bilateral    "for double vision"  . INGUINAL HERNIA REPAIR  05/12/2012   Procedure: HERNIA REPAIR INGUINAL ADULT;  Surgeon: Zenovia Jarred, MD;  Location: [place] [facility];  Service: General;  Laterality: Right;  . INGUINAL HERNIA REPAIR Left 02/02/2003   Dr. Georganna Skeans. repair with mesh. Same procedure done again in  05/22/2004   . INGUINAL HERNIA REPAIR Left ?date 2nd OR  . LEFT HEART CATH AND CORONARY ANGIOGRAPHY N/A 04/29/2018   Procedure: LEFT HEART CATH AND CORONARY ANGIOGRAPHY;  Surgeon: Martinique, Peter M, MD;  Location: [place] CV LAB;  Service: Cardiovascular;  Laterality: N/A;  . POSTERIOR CERVICAL FUSION/FORAMINOTOMY N/A 01/24/2017   Procedure: Cervical Three-Thoracic One-Laminectomy/Foraminotomy with Cervical Three-Thoracic One Fixation and  fusion;  Surgeon: Kevan Ny Ditty, MD;  Location: [facility];  Service: Neurosurgery;  Laterality: N/A;  Part-2 Posterior approach  . RECONSTRUCT / STABILIZE DISTAL ULNA    . SHOULDER OPEN ROTATOR CUFF REPAIR Left 2010  . SHOULDER SURGERY Left    "injured on the job; had to cut small bone out"  . THUMB FUSION Right        Family History  Problem Relation Age of Onset  . Heart attack Mother        x7. Still alive  . Heart attack Father        deceased b/c of MI  . Heart attack Brother        older, deceased b/c of MI   . Cancer Brother        bone cancer in 1 brother, ? brain cancer in other brother  . Heart attack Brother   . Other Brother        MVA  . Other Brother   . Bone cancer Brother   . Cancer Brother   . SIDS Sister     Social History   Tobacco  Use  . Smoking status: Never Smoker  . Smokeless tobacco: Never Used  Substance Use Topics  . Alcohol use: Not Currently    Comment: 16 oz beer every 2-3 days- states he does not drink  . Drug use: Not Currently    Types: "Crack" cocaine, Marijuana, Cocaine    Comment:  "quit 2008"    Home Medications Prior to Admission medications   Medication Sig Start Date End Date Taking? Authorizing Provider  allopurinol (ZYLOPRIM) 300 MG tablet TAKE 1 TABLET (300 MG TOTAL) BY MOUTH DAILY. 08/28/19   Ladell Pier, MD  amLODipine (NORVASC) 5 MG tablet Take 1 tablet (5 mg total) by mouth daily. 12/18/18   Ladell Pier, MD  apixaban (ELIQUIS) 5 MG TABS tablet Take 1 tablet (5 mg total) by mouth 2 (two) times daily. 09/07/19   Elsie Stain, MD  aspirin EC 81 MG tablet Take 1 tablet (81 mg total) by mouth daily. 12/26/18 12/26/19  Vonzella Nipple, NP  Calcium Carb-Cholecalciferol (CALCIUM-VITAMIN D) 600-400 MG-UNIT TABS Take 1 tablet by mouth daily. 05/07/18   Roxan Hockey, MD  chlordiazePOXIDE (LIBRIUM) 10 MG capsule Take one capsule three times day for one week, then one capsule twice a day for one week then one  capsule daily until supply is out. 09/07/19   Elsie Stain, MD  cyclobenzaprine (FLEXERIL) 10 MG tablet Take 10 mg by mouth 3 (three) times daily. 08/26/19   [provider]  divalproex (DEPAKOTE) 500 MG DR tablet 1 tablet in the morning, 2 in the evening Patient taking differently: Take 500-1,000 mg by mouth See admin instructions. Take 2 tablet in the morning and 1 in the evening 06/02/19   Kathrynn Ducking, MD  DULoxetine (CYMBALTA) 30 MG capsule TAKE 1 CAPSULE (30 MG TOTAL) BY MOUTH 2 (TWO) TIMES DAILY. 08/28/19   Ladell Pier, MD  famotidine (PEPCID) 20 MG tablet Take 20 mg by mouth 2 (two) times daily. 08/26/19   [provider]  folic acid (FOLVITE) 1 MG tablet Take 1 tablet (1 mg total) by mouth daily. 05/07/18   Roxan Hockey, MD  gabapentin (NEURONTIN) 100 MG capsule Take 2 capsules (200 mg total) by mouth 2 (two) times daily. Patient taking differently: Take 100 mg by mouth 2 (two) times daily.  05/15/19 04/04/20  Domenic Polite, MD  Lacosamide 100 MG TABS Take 1 tablet (100 mg total) by mouth 2 (two) times daily. 09/07/19 10/07/19  Elsie Stain, MD  losartan (COZAAR) 100 MG tablet TAKE 1 TABLET (100 MG TOTAL) BY MOUTH DAILY. 08/28/19   Ladell Pier, MD  metoCLOPramide (REGLAN) 10 MG tablet Take 10 mg by mouth 3 (three) times daily before meals.  08/28/19   [provider]  Multiple Vitamin (MULTIVITAMIN WITH MINERALS) TABS tablet Take 1 tablet by mouth daily. 05/07/18   Roxan Hockey, MD  NARCAN 4 MG/0.1ML LIQD nasal spray kit Place 1 spray into the nose once.  02/12/19   [provider]  nitroGLYCERIN (NITROSTAT) 0.4 MG SL tablet Place 1 tablet (0.4 mg total) under the tongue every 5 (five) minutes as needed for chest pain. 02/20/19   Ladell Pier, MD  oxyCODONE-acetaminophen (PERCOCET) 7.5-325 MG tablet Take 1 tablet by mouth 3 (three) times daily. Patient taking differently: Take 1 tablet by mouth 5 (five) times daily.  12/26/18  12/26/19  Vonzella Nipple, NP  rosuvastatin (CRESTOR) 40 MG tablet Take 1 tablet (40 mg total) by mouth daily. 04/21/19  Ladell Pier, MD  tamsulosin (FLOMAX) 0.4 MG CAPS capsule TAKE 1 CAPSULE BY MOUTH AT BEDTIME. Patient taking differently: Take 0.4 mg by mouth at bedtime.  08/28/19   Ladell Pier, MD  testosterone cypionate (DEPOTESTOSTERONE CYPIONATE) 200 MG/ML injection Inject 200 mg into the muscle every 30 (thirty) days. 08/23/19   [provider]  thiamine (VITAMIN B-1) 100 MG tablet Take 1 tablet (100 mg total) by mouth daily. 09/07/19   Elsie Stain, MD  Vitamin D, Ergocalciferol, (DRISDOL) 1.25 MG (50000 UT) CAPS capsule Take 50,000 Units by mouth every Sunday.     [provider]    Allergies    Shellfish allergy  Review of Systems   Review of Systems  All other systems are reviewed and are negative for acute change except as noted in the HPI.   Physical Exam Updated Vital Signs BP (!) 164/93 (BP Location: Left Arm)   Pulse 93   Temp 100.1 F (37.8 C) (Oral)   Resp (!) 24   SpO2 92%   Physical Exam Vitals and nursing note reviewed.  Constitutional:      General: He is in acute distress.     Comments: Chronically ill-appearing  HENT:     Head: Normocephalic and atraumatic.     Comments: No sinus or temporal tenderness.     Right Ear: Tympanic membrane and external ear normal.     Left Ear: Tympanic membrane and external ear normal.     Nose: Nose normal.     Mouth/Throat:     Mouth: Mucous membranes are dry.     Pharynx: Oropharynx is clear.  Eyes:     General: No scleral icterus.       Right eye: No discharge.        Left eye: No discharge.     Extraocular Movements: Extraocular movements intact.     Conjunctiva/sclera: Conjunctivae normal.     Pupils: Pupils are equal, round, and reactive to light.  Neck:     Vascular: No JVD.  Cardiovascular:     Rate and Rhythm: Normal rate and regular rhythm.     Pulses: Normal  pulses.          Radial pulses are 2+ on the right side and 2+ on the left side.     Heart sounds: Normal heart sounds.  Pulmonary:     Comments: Patient is tachypneic.  He is speaking in short sentences and is using accessory muscles.  SPO2 on room air is 89% during my exam. Lung sounds are diminished throughout.  No wheezing, rales, or rhonchi. Abdominal:     Comments: Abdomen is soft, non-distended, and non-tender in all quadrants. No rigidity, no guarding. No peritoneal signs.  Musculoskeletal:        General: Normal range of motion.     Cervical back: Normal range of motion.     Right lower leg: No edema.     Left lower leg: No edema.     Comments: Homans sign absent bilaterally, no lower extremity edema, no palpable cords, compartments are soft  Skin:    General: Skin is warm and dry.     Capillary Refill: Capillary refill takes less than 2 seconds.  Neurological:     Mental Status: He is oriented to person, place, and time.     GCS: GCS eye subscore is 4. GCS verbal subscore is 5. GCS motor subscore is 6.     Comments: Speech is clear and goal  oriented, follows commands CN III-XII intact, no facial droop Normal strength in upper and lower extremities bilaterally including dorsiflexion and plantar flexion, strong and equal grip strength Sensation normal to light and sharp touch Moves extremities without ataxia, coordination intact    Psychiatric:        Behavior: Behavior normal.       ED Results / Procedures / Treatments   Labs (all labs ordered are listed, but only abnormal results are displayed) Labs Reviewed  CBC WITH DIFFERENTIAL/PLATELET - Abnormal; Notable for the following components:      Result Value   Lymphs Abs 0.6 (*)    All other components within normal limits  URINALYSIS, ROUTINE W REFLEX MICROSCOPIC - Abnormal; Notable for the following components:   Hgb urine dipstick TRACE (*)    Ketones, ur 15 (*)    Protein, ur TRACE (*)    All other components  within normal limits  URINALYSIS, MICROSCOPIC (REFLEX) - Abnormal; Notable for the following components:   Bacteria, UA RARE (*)    All other components within normal limits  COMPREHENSIVE METABOLIC PANEL  D-DIMER, QUANTITATIVE (NOT AT [facility])  POC SARS CORONAVIRUS 2 AG -  ED    EKG None  Radiology CT Head Wo Contrast  Result Date: 09/27/2019 CLINICAL DATA:  63 year old male with 1 week of headache. Two days of shortness of breath. Positive for COVID-19. EXAM: CT HEAD WITHOUT CONTRAST TECHNIQUE: Contiguous axial images were obtained from the base of the skull through the vertex without intravenous contrast. COMPARISON:  09/25/2019 head CT. Brain MRI 12/25/2018 FINDINGS: Brain: Stable cerebral volume. No midline shift, ventriculomegaly, mass effect, evidence of mass lesion, intracranial hemorrhage or evidence of cortically based acute infarction. Stable gray-white matter differentiation with mild periventricular white matter hypodensity, stable compared to the March MRI. Vascular: Mild Calcified atherosclerosis at the skull base. No suspicious intracranial vascular hyperdensity. Skull: Negative. Sinuses/Orbits: Visualized paranasal sinuses and mastoids are stable and well pneumatized. Other: Mildly Disconjugate gaze, otherwise negative orbits. No acute scalp soft tissue finding. IMPRESSION: Stable largely negative for age non contrast CT appearance of the brain. No acute intracranial abnormality. Electronically Signed   By: Genevie Ann M.D.   On: 10/09/2019 11:34   DG Chest Portable 1 View  Result Date: 10/01/2019 CLINICAL DATA:  Shortness of breath. Additional provided: Headache for 1 week, shortness of breath for 2 days. COVID (+) 10 days ago. EXAM: PORTABLE CHEST 1 VIEW COMPARISON:  Chest radiograph 09/25/2019 FINDINGS: Mild cardiomegaly. Redemonstrated gaseous distention of the stomach beneath an elevated left hemidiaphragm. Associated left basilar atelectasis. Ill-defined airspace opacity within  the mid to lower right lung likely reflecting pneumonia given provided history. No sizable pleural effusion or evidence of pneumothorax. No acute bony abnormality. Partially visualized cervical spinal fusion hardware. IMPRESSION: Ill-defined airspace opacity within the mid to lower right lung, likely reflecting pneumonia given provided history. Redemonstrated gaseous distention of the stomach beneath and elevated left hemidiaphragm. Associated left basilar atelectasis. Mild cardiomegaly. Electronically Signed   By: Kellie Simmering DO   On: 10/03/2019 10:02    Procedures .Critical Care Performed by: Cherre Robins, PA-C Authorized by: Cherre Robins, PA-C   Critical care provider statement:    Critical care time (minutes):  34   Critical care time was exclusive of:  Separately billable procedures and treating other patients and teaching time   Critical care was necessary to treat or prevent imminent or life-threatening deterioration of the following conditions:  Respiratory failure   Critical  care was time spent personally by me on the following activities:  Blood draw for specimens, discussions with consultants, evaluation of patient's response to treatment, examination of patient, obtaining history from patient or surrogate, review of old charts, re-evaluation of patient's condition, pulse oximetry, ordering and review of radiographic studies, ordering and review of laboratory studies, ordering and performing treatments and interventions and development of treatment plan with patient or surrogate   I assumed direction of critical care for this patient from another provider in my specialty: no     (including critical care time)  Medications Ordered in ED Medications  acetaminophen (TYLENOL) tablet 650 mg (650 mg Oral Given 10/11/2019 0932)    ED Course  I have reviewed the triage vital signs and the nursing notes.  Pertinent labs & imaging results that were available during my care of  the patient were reviewed by me and considered in my medical decision making (see chart for details).    MDM Rules/Calculators/A&P     CHA2DS2/VAS Stroke Risk Points      N/A >= 2 Points: High Risk  1 - 1.99 Points: Medium Risk  0 Points: Low Risk    A final score could not be computed because of missing components.: Last  Change: N/A   This score determines the patient's risk of having a stroke if the  patient has atrial fibrillation.  This score is not applicable to this patient. Components are not  calculated.   Patient seen and examined. Patient chronically ill-appearing and is in acute respiratory distress.  He is hypoxic on room air to 89%.  He is using accessory muscles and speaking in short sentences.  Patient placed on 3 L nasal cannula with improvement to 95%.  His lung sounds are diminished throughout.  Neuro exam without focal deficit.  Head CT is negative for acute findings.  On reassessment he states headache has improved after Tylenol.  I viewed pt's chest xray and it shows an ill-defined airspace opacity within the mid to lower right lung, suggestive of covid pneumonia given his recent covid diagnosis on 09/25/2019.  CMP and CBC are unremarkable. D-dimer is negative. UA without signs of infection. This case was discussed with Dr. Wyvonnia Dusky who has seen the patient and agrees with plan to admit. Spoke with Dr. Andria Frames with hospitalist service who agrees to assume care of patient and bring into the hospital for further evaluation and management.      Portions of this note were generated with [employer]. Dictation errors may occur despite best attempts at proofreading.  Raden Cerny was evaluated in Emergency Department on 10/20/2019 for the symptoms described in the history of present illness. He was evaluated in the context of the global COVID-19 pandemic, which necessitated consideration that the patient might be at risk for infection with the SARS-CoV-2 virus that  causes COVID-19. Institutional protocols and algorithms that pertain to the evaluation of patients at risk for COVID-19 are in a state of rapid change based on information released by regulatory bodies including the CDC and federal and state organizations. These policies and algorithms were followed during the patient's care in the ED.    Final Clinical Impression(s) / ED Diagnoses Final diagnoses:  Pneumonia due to COVID-19 virus    Rx / DC Orders ED Discharge Orders    None       Flint Melter 10/12/2019 1422    Ezequiel Essex, MD 10/18/2019 1730

## 2019-10-02 NOTE — Progress Notes (Signed)
1855- Pt's blood sugar 36 for dinner x2.  Pt given an amp of d50 and some orange juice.  Will reassess blood sugar in 15 mins.  Paged answering service about patient's blood sugar at 1910. 1915- Pt's blood sugar recheck was 114.  MD made aware of patient's blood sugar.  Continue to monitor patient.

## 2019-10-02 NOTE — Plan of Care (Signed)
  Problem: Respiratory: Goal: Will maintain a patent airway Outcome: Progressing Goal: Complications related to the disease process, condition or treatment will be avoided or minimized Outcome: Progressing   

## 2019-10-02 NOTE — Discharge Instructions (Signed)

## 2019-10-03 ENCOUNTER — Other Ambulatory Visit: Payer: Self-pay

## 2019-10-03 DIAGNOSIS — J1289 Other viral pneumonia: Secondary | ICD-10-CM

## 2019-10-03 DIAGNOSIS — E1165 Type 2 diabetes mellitus with hyperglycemia: Secondary | ICD-10-CM

## 2019-10-03 LAB — COMPREHENSIVE METABOLIC PANEL
ALT: 16 U/L (ref 0–44)
AST: 21 U/L (ref 15–41)
Albumin: 3.1 g/dL — ABNORMAL LOW (ref 3.5–5.0)
Alkaline Phosphatase: 40 U/L (ref 38–126)
Anion gap: 13 (ref 5–15)
BUN: 16 mg/dL (ref 8–23)
CO2: 22 mmol/L (ref 22–32)
Calcium: 8.8 mg/dL — ABNORMAL LOW (ref 8.9–10.3)
Chloride: 104 mmol/L (ref 98–111)
Creatinine, Ser: 1.12 mg/dL (ref 0.61–1.24)
GFR calc Af Amer: >60 mL/min (ref >60)
GFR calc non Af Amer: >60 mL/min (ref >60)
Glucose, Bld: 156 mg/dL — ABNORMAL HIGH (ref 70–99)
Potassium: 4.2 mmol/L (ref 3.5–5.1)
Sodium: 139 mmol/L (ref 135–145)
Total Bilirubin: 0.7 mg/dL (ref 0.3–1.2)
Total Protein: 7.5 g/dL (ref 6.5–8.1)

## 2019-10-03 LAB — GLUCOSE, CAPILLARY
Glucose-Capillary: 202 mg/dL — ABNORMAL HIGH (ref 70–99)
Glucose-Capillary: 226 mg/dL — ABNORMAL HIGH (ref 70–99)
Glucose-Capillary: 285 mg/dL — ABNORMAL HIGH (ref 70–99)

## 2019-10-03 LAB — CBC WITH DIFFERENTIAL/PLATELET
Abs Immature Granulocytes: 0.06 10*3/uL (ref 0.00–0.07)
Basophils Absolute: 0 10*3/uL (ref 0.0–0.1)
Basophils Relative: 0 %
Eosinophils Absolute: 0 10*3/uL (ref 0.0–0.5)
Eosinophils Relative: 0 %
HCT: 49 % (ref 39.0–52.0)
Hemoglobin: 15.6 g/dL (ref 13.0–17.0)
Immature Granulocytes: 1 %
Lymphocytes Relative: 6 %
Lymphs Abs: 0.4 10*3/uL — ABNORMAL LOW (ref 0.7–4.0)
MCH: 27.8 pg (ref 26.0–34.0)
MCHC: 31.8 g/dL (ref 30.0–36.0)
MCV: 87.2 fL (ref 80.0–100.0)
Monocytes Absolute: 0.2 10*3/uL (ref 0.1–1.0)
Monocytes Relative: 2 %
Neutro Abs: 6.1 10*3/uL (ref 1.7–7.7)
Neutrophils Relative %: 91 %
Platelets: 190 10*3/uL (ref 150–400)
RBC: 5.62 MIL/uL (ref 4.22–5.81)
RDW: 14.6 % (ref 11.5–15.5)
WBC: 6.7 10*3/uL (ref 4.0–10.5)
nRBC: 0 % (ref 0.0–0.2)

## 2019-10-03 LAB — C-REACTIVE PROTEIN: CRP: 14.8 mg/dL — ABNORMAL HIGH (ref <1.0)

## 2019-10-03 LAB — FERRITIN: Ferritin: 144 ng/mL (ref 24–336)

## 2019-10-03 LAB — ABO/RH: ABO/RH(D): B POS

## 2019-10-03 LAB — MRSA PCR SCREENING: MRSA by PCR: NEGATIVE

## 2019-10-03 LAB — PHOSPHORUS: Phosphorus: 3.1 mg/dL (ref 2.5–4.6)

## 2019-10-03 LAB — D-DIMER, QUANTITATIVE: D-Dimer, Quant: 0.37 ug/mL-FEU (ref 0.00–0.50)

## 2019-10-03 LAB — MAGNESIUM: Magnesium: 2.2 mg/dL (ref 1.7–2.4)

## 2019-10-03 MED ORDER — SODIUM CHLORIDE 0.9 % IV SOLN
100.0000 mg | Freq: Every day | INTRAVENOUS | Status: AC
  Administered 2019-10-03 – 2019-10-06 (×4): 100 mg via INTRAVENOUS
  Filled 2019-10-03 (×4): qty 20

## 2019-10-03 MED ORDER — SODIUM CHLORIDE 0.9 % IV SOLN
200.0000 mg | Freq: Once | INTRAVENOUS | Status: AC
  Administered 2019-10-03: 200 mg via INTRAVENOUS
  Filled 2019-10-03: qty 40

## 2019-10-03 MED ORDER — DEXAMETHASONE SODIUM PHOSPHATE 10 MG/ML IJ SOLN
6.0000 mg | Freq: Two times a day (BID) | INTRAMUSCULAR | Status: DC
  Administered 2019-10-03: 6 mg via INTRAVENOUS
  Filled 2019-10-03 (×2): qty 1

## 2019-10-03 MED ORDER — INSULIN GLARGINE 100 UNIT/ML ~~LOC~~ SOLN
8.0000 [IU] | Freq: Every day | SUBCUTANEOUS | Status: DC
  Administered 2019-10-04 – 2019-10-10 (×6): 8 [IU] via SUBCUTANEOUS
  Filled 2019-10-03 (×10): qty 0.08

## 2019-10-03 NOTE — Evaluation (Signed)
Physical Therapy Evaluation Patient Details Name: Russell Carter MRN: [id number] DOB: [birth year] Today's Date: 10/03/2019   History of Present Illness  Pt is a 63 y.o. male recently admitted to [facility] four days with d/c home 09/29/19, now readmitted 09/30/2019 with worsening SOB and headache. PMH includes seizures, CAD, stroke, heart block, cervical stenosis s/p neck sx with subsequent L-side pain/weakness, CKD, DM2, substance abuse, chronic opiate use.    Clinical Impression  Pt presents with an overall decrease in functional mobility secondary to above. PTA, pt recently d/c home from [facility] 09/29/19, was mod indep with RW; worsening SOB and headache led to readmission. Today, pt moving well with RW on 2L O2; limited by fatigue, generalized weakness and decreased activity tolerance. Provided flutter valve & IS; educ on importance of OOB. Pt would benefit from continued acute PT services to maximize functional mobility and independence prior to d/c with HHPT services.     Follow Up Recommendations Home health PT    Equipment Recommendations  None recommended by PT    Recommendations for Other Services       Precautions / Restrictions Precautions Precautions: Fall Restrictions Weight Bearing Restrictions: No      Mobility  Bed Mobility Overal bed mobility: Independent                Transfers Overall transfer level: Modified independent Equipment used: Rolling walker (2 wheeled) Transfers: Sit to/from Stand              Ambulation/Gait Ambulation/Gait assistance: Supervision Gait Distance (Feet): 150 Feet Assistive device: Rolling walker (2 wheeled) Gait Pattern/deviations: Step-through pattern;Decreased stride length;Trunk flexed Gait velocity: Decreased Gait velocity interpretation: 1.31 - 2.62 ft/sec, indicative of limited community ambulator General Gait Details: Slow, steady gait with RW and supervision; 2x standing rest breaks, pt leaning over RW c/o fatigue and  chest tightness. HR 80s and stable; SpO2 down to 88% on 2L, increased to 3L for comfort  Stairs            Wheelchair Mobility    Modified Rankin (Stroke Patients Only)       Balance Overall balance assessment: Needs assistance   Sitting balance-Leahy Scale: Good Sitting balance - Comments: Indep to don socks sitting EOB     Standing balance-Leahy Scale: Fair Standing balance comment: Can static stand and walk without UE support; dynamic stability improved with RW                             Pertinent Vitals/Pain Pain Assessment: Faces Faces Pain Scale: Hurts a little bit Pain Location: Generalized Pain Descriptors / Indicators: Discomfort Pain Intervention(s): Monitored during session    Home Living Family/patient expects to be discharged to:: Private residence Living Arrangements: Spouse/significant other(fiance) Available Help at Discharge: Family;Available PRN/intermittently Type of Home: House Home Access: Stairs to enter Entrance Stairs-Rails: Doctor, general practice of Steps: 3 Home Layout: One level Home Equipment: Cane - single point;Walker - 2 wheels Additional Comments: wears glasses    Prior Function Level of Independence: Independent         Comments: Does not work, independent at home     Hand Dominance   Dominant Hand: Right    Extremity/Trunk Assessment   Upper Extremity Assessment Upper Extremity Assessment: Overall WFL for tasks assessed    Lower Extremity Assessment Lower Extremity Assessment: Overall WFL for tasks assessed    Cervical / Trunk Assessment Cervical / Trunk Assessment: Other exceptions Cervical / Trunk  Exceptions: Prior back and cervical sx  Communication   Communication: No difficulties  Cognition Arousal/Alertness: Awake/alert Behavior During Therapy: WFL for tasks assessed/performed Overall Cognitive Status: Within Functional Limits for tasks assessed                                         General Comments General comments (skin integrity, edema, etc.): Provided flutter valve and IS, pt demonstrate correct technique with flutter valve, declined IS right now due to fatigue/SOB; reviewed activity recommendations    Exercises     Assessment/Plan    PT Assessment Patient needs continued PT services  PT Problem List Decreased activity tolerance;Decreased balance;Decreased mobility;Cardiopulmonary status limiting activity       PT Treatment Interventions Gait training;Stair training;Functional mobility training;Therapeutic activities;Therapeutic exercise;Balance training;Patient/family education    PT Goals (Current goals can be found in the Care Plan section)  Acute Rehab PT Goals Patient Stated Goal: Not have to come back to [facility] again PT Goal Formulation: With patient Time For Goal Achievement: 10/17/19 Potential to Achieve Goals: Good    Frequency Min 3X/week   Barriers to discharge        Co-evaluation               AM-PAC PT "6 Clicks" Mobility  Outcome Measure Help needed turning from your back to your side while in a flat bed without using bedrails?: None Help needed moving from lying on your back to sitting on the side of a flat bed without using bedrails?: None Help needed moving to and from a bed to a chair (including a wheelchair)?: None Help needed standing up from a chair using your arms (e.g., wheelchair or bedside chair)?: None Help needed to walk in hospital room?: A Little Help needed climbing 3-5 steps with a railing? : A Little 6 Click Score: 22    End of Session Equipment Utilized During Treatment: Oxygen Activity Tolerance: Patient tolerated treatment well Patient left: in bed;with call bell/phone within reach Nurse Communication: Mobility status PT Visit Diagnosis: Difficulty in walking, not elsewhere classified (R26.2);Other abnormalities of gait and mobility (R26.89)    Time: 1030-1056 PT Time  Calculation (min) (ACUTE ONLY): 26 min   Charges:   PT Evaluation $PT Eval Moderate Complexity: 1 Mod PT Treatments $Therapeutic Exercise: 8-22 mins   Mabeline Caras, PT, DPT Acute Rehabilitation Services  Pager [phone] Office [place] 10/03/2019, 12:20 PM

## 2019-10-03 NOTE — Progress Notes (Signed)
Multiple attempts by multiple staff members to restart IV without success. Will contact ICU to see if anyone capable of using the vein finder can try. If not, will consult IV team to restart IV in the morning. Hortencia Conradi RN

## 2019-10-03 NOTE — Progress Notes (Signed)
Called and spoke to pharmacist; let pharmacist know that patient's 1600 dose of lantus has not arrived on the floor yet after I requested it at 1630.

## 2019-10-03 NOTE — Progress Notes (Signed)
Remdesivir - Pharmacy Brief Note   O:  ALT: 16 CXR: Opacity on R mid-lower lung likely reflecting pneumonia SpO2: 91-94% on 2L 12/11 COVID Ag +    A/P:  Patient received remedesvir 12/5 to 12/8.  Presents back to ED 12/11 with worsening shortness of breath, Fever, and headache.  Pharmacy asked to repeat remedsivir course Remdesivir 200 mg IVPB once followed by 100 mg IVPB daily x 4 days.   Doreene Eland, PharmD, BCPS.   Work Cell: [phone] 10/03/2019 11:37 AM

## 2019-10-03 NOTE — Progress Notes (Signed)
Spoke to patient's mother in room over speakerphone. Mother updated, all questions/concerns addressed, and patient was able to speak with her.

## 2019-10-03 NOTE — Progress Notes (Signed)
PROGRESS NOTE  Russell Carter GHW:[id number] DOB: May 26, 1956 DOA: 18-Oct-2019  PCP: Marcine Matar, MD  Brief History/Interval Summary: 63 y.o. male with medical history significant of CAD, osteoarthritis, polysubstance abuse, chronic anticoagulation, diabetes, cocaine abuse, hyperlipidemia, cervical stenosis, anxiety disorder, hypertension, chronic kidney disease, seizure disorder, history of CVA, and bipolar disorder who was recently admitted with COVID pneumonia, who returned with worsening symptoms.  He was discharged on 12/8.  He was no longer hypoxic.  His inflammatory markers have become normal.  Due to hypoxia patient was hospitalized for further management.  Reason for Visit: Pneumonia due to COVID-19.  Acute respiratory failure with hypoxia  Consultants: None  Procedures: None  Antibiotics: Anti-infectives (From admission, onward)   Start     Dose/Rate Route Frequency Ordered Stop   10/04/19 1000  remdesivir 100 mg in sodium chloride 0.9 % 100 mL IVPB     100 mg 200 mL/hr over 30 Minutes Intravenous Daily 10/03/19 1148 10/08/19 0959   10/03/19 1400  remdesivir 200 mg in sodium chloride 0.9% 250 mL IVPB     200 mg 580 mL/hr over 30 Minutes Intravenous Once 10/03/19 1148        Subjective/Interval History: Patient states that he continues to have difficulty breathing.  Occasional cough. some nausea but no vomiting.  Loose stool last night.  Denies any abdominal pain.    Assessment/Plan:  Acute Hypoxic Resp. Failure/Pneumonia due to COVID-19  COVID-19 Labs  Lab Results  Component Value Date   SARSCOV2NAA POSITIVE (A) 09/25/2019   SARSCOV2NAA NEGATIVE 08/19/2019   SARSCOV2NAA NEGATIVE 05/11/2019    Recent Labs  Lab 09/27/19 0500 09/28/19 0117 10-18-19 0915 10/03/19 0120  DDIMER  --  <0.27 0.37 0.37  FERRITIN 108  --   --  144  CRP 0.8 0.8  --  14.8*  ALT 30 28 17 16     Objective findings: Fever: He was noted to have a temperature of 102 F yesterday.   Noted to be afebrile here Oxygen requirements: Nasal cannula.  2 L/min.  Saturating in the early 90s.  COVID 19 Therapeutics: Antibacterials: None Remdesivir: Started on second course today 12/12. Steroids: Dexamethasone 6 mg every 12 hours Diuretics: Not on scheduled diuretics Actemra: Not given Convalescent Plasma: Not given Vitamin C and Zinc: Continue PUD Prophylaxis: Initiate famotidine DVT Prophylaxis: On Eliquis  Patient's respiratory status seems to be stable.  He is currently on 2 L of oxygen saturating in the early 90s.  He was started back on remdesivir to do a second course.  Increase the dose of dexamethasone.  His CRP is noted to be elevated at 14.8.  WBC is normal.  Do not suspect secondary bacterial infection.  May need to consider Actemra and convalescent plasma depending on his clinical course.  Incentive spirometry, mobilization, prone positioning as tolerated.  Low suspicion for PE as patient is on Eliquis.  Plus his D-dimer was normal.  History of coronary artery disease Stable.  Denies any chest pain.  Continue aspirin and statin.  History of CVA Continue statin.  Continue anticoagulation with Eliquis.  Diabetes mellitus type 2, uncontrolled with hyperglycemia HbA1c 6.3 in October.  Elevated CBGs due to steroids.  Continue SSI.  Any glucose lowering agents at home.  History of gout Continue allopurinol.  History of bipolar disorder Continue Depakote.  He is also on duloxetine.  Stable.  Essential hypertension BP is reasonably well controlled this morning.  High levels noted last night.  Continue amlodipine and losartan.  History of  BPH Continue Flomax.   DVT Prophylaxis: On Eliquis Code Status: Full code Family Communication: Discussed with the patient Disposition Plan: Management as outlined above.  Hopefully will be able to return home when improved.   Medications:  Scheduled: . allopurinol  300 mg Oral Daily  . amLODipine  5 mg Oral Daily  .  apixaban  5 mg Oral BID  . aspirin EC  81 mg Oral Daily  . calcium-vitamin D  1 tablet Oral Daily  . dexamethasone (DECADRON) injection  6 mg Intravenous Q12H  . divalproex  1,000 mg Oral QHS  . divalproex  500 mg Oral q morning - 10a  . DULoxetine  30 mg Oral BID  . famotidine  20 mg Oral BID  . folic acid  1 mg Oral Daily  . gabapentin  200 mg Oral BID  . insulin aspart  0-5 Units Subcutaneous QHS  . insulin aspart  0-9 Units Subcutaneous TID WC  . Ipratropium-Albuterol  1 puff Inhalation Q6H  . lacosamide  100 mg Oral BID  . losartan  100 mg Oral Daily  . Magnesium Oxide  200 mg Oral QHS  . metoCLOPramide  10 mg Oral TID AC  . multivitamin with minerals  1 tablet Oral Daily  . oxyCODONE-acetaminophen  1 tablet Oral TID   And  . oxyCODONE  2.5 mg Oral TID  . rosuvastatin  40 mg Oral Daily  . tamsulosin  0.4 mg Oral QHS  . thiamine  100 mg Oral Daily  . vitamin C  500 mg Oral Daily  . zinc sulfate  220 mg Oral Daily   Continuous: . remdesivir 200 mg in sodium chloride 0.9% 250 mL IVPB     Followed by  . [START ON 10/04/2019] remdesivir 100 mg in NS 100 mL 100 mg (10/03/19 1344)   OVZ:CHYIFOYDXAJOI, nitroGLYCERIN, ondansetron **OR** ondansetron (ZOFRAN) IV, senna-docusate   Objective:  Vital Signs  Vitals:   2019/10/13 2200 10/03/19 0300 10/03/19 0400 10/03/19 0748  BP:  (!) 147/86 123/78 109/89  Pulse:   67 71  Resp:   20 20  Temp: (!) 100.4 F (38 C) (!) 97.5 F (36.4 C)  97.6 F (36.4 C)  TempSrc: Oral Oral  Oral  SpO2:   95% 91%    Intake/Output Summary (Last 24 hours) at 10/03/2019 1357 Last data filed at 10/03/2019 0900 Gross per 24 hour  Intake 480 ml  Output 100 ml  Net 380 ml   There were no vitals filed for this visit.  General appearance: Awake alert.  In no distress Resp: Mildly tachypneic at rest.  Coarse breath sound bilaterally few crackles at the bases.  No wheezing or rhonchi. Cardio: S1-S2 is normal regular.  No S3-S4.  No rubs murmurs or  bruit GI: Abdomen is soft.  Nontender nondistended.  Bowel sounds are present normal.  No masses organomegaly Extremities: No edema.  Full range of motion of lower extremities. Neurologic: Alert and oriented x3.  No focal neurological deficits.    Lab Results:  Data Reviewed: I have personally reviewed following labs and imaging studies  CBC: Recent Labs  Lab 09/27/19 0500 09/28/19 0117 10/13/19 0915 10/03/19 0120  WBC 2.9* 6.0 6.3 6.7  NEUTROABS 2.0 4.5 5.2 6.1  HGB 13.6 13.8 16.2 15.6  HCT 42.7 43.8 51.4 49.0  MCV 89.5 88.8 88.8 87.2  PLT 143* 206 178 190    Basic Metabolic Panel: Recent Labs  Lab 09/27/19 0500 09/28/19 0117 10/13/2019 0915 10/03/19 0120  NA  138 139 137 139  K 4.7 4.4 3.9 4.2  CL 110 107 98 104  CO2 21* 22 24 22   GLUCOSE 181* 139* 94 156*  BUN 17 23 13 16   CREATININE 1.03 1.13 1.18 1.12  CALCIUM 7.8* 8.2* 9.1 8.8*  MG  --  2.1  --  2.2  PHOS  --   --   --  3.1    GFR: Estimated Creatinine Clearance: 78.5 mL/min (by C-G formula based on SCr of 1.12 mg/dL).  Liver Function Tests: Recent Labs  Lab 09/27/19 0500 09/28/19 0117 09/22/2019 0915 10/03/19 0120  AST 27 23 22 21   ALT 30 28 17 16   ALKPHOS 34* 39 44 40  BILITOT 0.6 0.3 0.8 0.7  PROT 5.7* 6.6 7.8 7.5  ALBUMIN 2.8* 3.3* 3.5 3.1*    CBG: Recent Labs  Lab 09/23/2019 2129 09/22/2019 2254 10/03/19 0353 10/03/19 0746 10/03/19 1217  GLUCAP 79 144* 202* 226* 285*     Anemia Panel: Recent Labs    10/03/19 0120  FERRITIN 144    Recent Results (from the past 240 hour(s))  Urine culture     Status: None   Collection Time: 09/25/19  9:00 PM   Specimen: Urine, Clean Catch  Result Value Ref Range Status   Specimen Description URINE, CLEAN CATCH  Final   Special Requests NONE  Final   Culture   Final    NO GROWTH Performed at [facility], [address] [place], [place] [address]    Report Status 09/27/2019 FINAL  Final  SARS CORONAVIRUS 2 (TAT 6-24 HRS) Nasopharyngeal  Nasopharyngeal Swab     Status: Abnormal   Collection Time: 09/25/19  9:38 PM   Specimen: Nasopharyngeal Swab  Result Value Ref Range Status   SARS Coronavirus 2 POSITIVE (A) NEGATIVE Final    Comment: RESULT CALLED TO, READ BACK BY AND VERIFIED WITHRoyden Purl: B. OLDLAND,CHARGE RN [phone] 09/26/2019 T. TYSOR (NOTE) SARS-CoV-2 target nucleic acids are DETECTED. The SARS-CoV-2 RNA is generally detectable in upper and lower respiratory specimens during the acute phase of infection. Positive results are indicative of the presence of SARS-CoV-2 RNA. Clinical correlation with patient history and other diagnostic information is  necessary to determine patient infection status. Positive results do not rule out bacterial infection or co-infection with other viruses.  The expected result is Negative. Fact Sheet for Patients: HairSlick.nohttps://www.fda.gov/media/138098/download Fact Sheet for Healthcare Providers: quierodirigir.comhttps://www.fda.gov/media/138095/download This test is not yet approved or cleared by the [place] FDA and  has been authorized for detection and/or diagnosis of SARS-CoV-2 by FDA under an Emergency Use Authorization (EUA). This EUA will remain  in effect (meaning this test can be use d) for the duration of the COVID-19 declaration under Section 564(b)(1) of the Act, 21 U.S.C. section 360bbb-3(b)(1), unless the authorization is terminated or revoked sooner. Performed at [facility], [address] [place], [place] [address]   Blood Culture (routine x 2)     Status: None   Collection Time: 09/26/19 12:45 AM   Specimen: BLOOD  Result Value Ref Range Status   Specimen Description BLOOD LEFT ARM  Final   Special Requests   Final    BOTTLES DRAWN AEROBIC AND ANAEROBIC Blood Culture adequate volume   Culture   Final    NO GROWTH 5 DAYS Performed at [facility], [address] [place], [place] [address]    Report Status 10/01/2019 FINAL  Final      Radiology Studies: CT Head  Wo  Contrast  Result Date: 10/01/2019 CLINICAL DATA:  64 year old male with 1 week of headache. Two days of shortness of breath. Positive for COVID-19. EXAM: CT HEAD WITHOUT CONTRAST TECHNIQUE: Contiguous axial images were obtained from the base of the skull through the vertex without intravenous contrast. COMPARISON:  09/25/2019 head CT. Brain MRI 12/25/2018 FINDINGS: Brain: Stable cerebral volume. No midline shift, ventriculomegaly, mass effect, evidence of mass lesion, intracranial hemorrhage or evidence of cortically based acute infarction. Stable gray-white matter differentiation with mild periventricular white matter hypodensity, stable compared to the March MRI. Vascular: Mild Calcified atherosclerosis at the skull base. No suspicious intracranial vascular hyperdensity. Skull: Negative. Sinuses/Orbits: Visualized paranasal sinuses and mastoids are stable and well pneumatized. Other: Mildly Disconjugate gaze, otherwise negative orbits. No acute scalp soft tissue finding. IMPRESSION: Stable largely negative for age non contrast CT appearance of the brain. No acute intracranial abnormality. Electronically Signed   By: Genevie Ann M.D.   On: 09/25/2019 11:34   DG Chest Portable 1 View  Result Date: 09/30/2019 CLINICAL DATA:  Shortness of breath. Additional provided: Headache for 1 week, shortness of breath for 2 days. COVID (+) 10 days ago. EXAM: PORTABLE CHEST 1 VIEW COMPARISON:  Chest radiograph 09/25/2019 FINDINGS: Mild cardiomegaly. Redemonstrated gaseous distention of the stomach beneath an elevated left hemidiaphragm. Associated left basilar atelectasis. Ill-defined airspace opacity within the mid to lower right lung likely reflecting pneumonia given provided history. No sizable pleural effusion or evidence of pneumothorax. No acute bony abnormality. Partially visualized cervical spinal fusion hardware. IMPRESSION: Ill-defined airspace opacity within the mid to lower right lung, likely reflecting pneumonia  given provided history. Redemonstrated gaseous distention of the stomach beneath and elevated left hemidiaphragm. Associated left basilar atelectasis. Mild cardiomegaly. Electronically Signed   By: Kellie Simmering DO   On: 09/29/2019 10:02       LOS: 1 day   [facility] Pager on [email or web address]  10/03/2019, 1:57 PM

## 2019-10-03 NOTE — Plan of Care (Signed)

## 2019-10-04 DIAGNOSIS — N179 Acute kidney failure, unspecified: Secondary | ICD-10-CM

## 2019-10-04 LAB — CBC WITH DIFFERENTIAL/PLATELET
Abs Immature Granulocytes: 0.18 10*3/uL — ABNORMAL HIGH (ref 0.00–0.07)
Basophils Absolute: 0 10*3/uL (ref 0.0–0.1)
Basophils Relative: 0 %
Eosinophils Absolute: 0 10*3/uL (ref 0.0–0.5)
Eosinophils Relative: 0 %
HCT: 46.3 % (ref 39.0–52.0)
Hemoglobin: 14.5 g/dL (ref 13.0–17.0)
Immature Granulocytes: 1 %
Lymphocytes Relative: 7 %
Lymphs Abs: 1 10*3/uL (ref 0.7–4.0)
MCH: 27.8 pg (ref 26.0–34.0)
MCHC: 31.3 g/dL (ref 30.0–36.0)
MCV: 88.7 fL (ref 80.0–100.0)
Monocytes Absolute: 0.7 10*3/uL (ref 0.1–1.0)
Monocytes Relative: 5 %
Neutro Abs: 13.1 10*3/uL — ABNORMAL HIGH (ref 1.7–7.7)
Neutrophils Relative %: 87 %
Platelets: 227 10*3/uL (ref 150–400)
RBC: 5.22 MIL/uL (ref 4.22–5.81)
RDW: 14.6 % (ref 11.5–15.5)
WBC: 15 10*3/uL — ABNORMAL HIGH (ref 4.0–10.5)
nRBC: 0 % (ref 0.0–0.2)

## 2019-10-04 LAB — GLUCOSE, CAPILLARY
Glucose-Capillary: 211 mg/dL — ABNORMAL HIGH (ref 70–99)
Glucose-Capillary: 215 mg/dL — ABNORMAL HIGH (ref 70–99)
Glucose-Capillary: 241 mg/dL — ABNORMAL HIGH (ref 70–99)
Glucose-Capillary: 176 mg/dL — ABNORMAL HIGH (ref 70–99)
Glucose-Capillary: 182 mg/dL — ABNORMAL HIGH (ref 70–99)

## 2019-10-04 LAB — COMPREHENSIVE METABOLIC PANEL
ALT: 16 U/L (ref 0–44)
AST: 21 U/L (ref 15–41)
Albumin: 2.9 g/dL — ABNORMAL LOW (ref 3.5–5.0)
Alkaline Phosphatase: 38 U/L (ref 38–126)
Anion gap: 15 (ref 5–15)
BUN: 56 mg/dL — ABNORMAL HIGH (ref 8–23)
CO2: 23 mmol/L (ref 22–32)
Calcium: 9 mg/dL (ref 8.9–10.3)
Chloride: 98 mmol/L (ref 98–111)
Creatinine, Ser: 3.34 mg/dL — ABNORMAL HIGH (ref 0.61–1.24)
GFR calc Af Amer: 21 mL/min — ABNORMAL LOW (ref >60)
GFR calc non Af Amer: 18 mL/min — ABNORMAL LOW (ref >60)
Glucose, Bld: 151 mg/dL — ABNORMAL HIGH (ref 70–99)
Potassium: 5 mmol/L (ref 3.5–5.1)
Sodium: 136 mmol/L (ref 135–145)
Total Bilirubin: 0.3 mg/dL (ref 0.3–1.2)
Total Protein: 7 g/dL (ref 6.5–8.1)

## 2019-10-04 LAB — D-DIMER, QUANTITATIVE: D-Dimer, Quant: 0.32 ug/mL-FEU (ref 0.00–0.50)

## 2019-10-04 LAB — MAGNESIUM: Magnesium: 2.8 mg/dL — ABNORMAL HIGH (ref 1.7–2.4)

## 2019-10-04 LAB — C-REACTIVE PROTEIN: CRP: 8.8 mg/dL — ABNORMAL HIGH (ref <1.0)

## 2019-10-04 LAB — FERRITIN: Ferritin: 235 ng/mL (ref 24–336)

## 2019-10-04 MED ORDER — SODIUM CHLORIDE 0.45 % IV BOLUS
500.0000 mL | Freq: Once | INTRAVENOUS | Status: AC
  Administered 2019-10-04: 500 mL via INTRAVENOUS

## 2019-10-04 MED ORDER — SODIUM CHLORIDE 0.45 % IV SOLN
INTRAVENOUS | Status: DC
  Administered 2019-10-04 – 2019-10-05 (×2): via INTRAVENOUS

## 2019-10-04 MED ORDER — FAMOTIDINE 20 MG PO TABS
20.0000 mg | ORAL_TABLET | Freq: Every day | ORAL | Status: DC
  Administered 2019-10-05 – 2019-10-18 (×14): 20 mg via ORAL
  Filled 2019-10-04 (×13): qty 1

## 2019-10-04 MED ORDER — DEXAMETHASONE 6 MG PO TABS
6.0000 mg | ORAL_TABLET | Freq: Two times a day (BID) | ORAL | Status: DC
  Administered 2019-10-04: 6 mg via ORAL
  Filled 2019-10-04: qty 1

## 2019-10-04 MED ORDER — DEXAMETHASONE SODIUM PHOSPHATE 10 MG/ML IJ SOLN
6.0000 mg | Freq: Two times a day (BID) | INTRAMUSCULAR | Status: DC
  Administered 2019-10-04 – 2019-10-10 (×11): 6 mg via INTRAVENOUS
  Filled 2019-10-04 (×12): qty 1

## 2019-10-04 NOTE — Progress Notes (Addendum)
Patient's IV noted to be infiltrated and left arm is swollen compared to right arm. Attempted to place new IV x 2 attempts by Leslye Peer, RN. No success. IV team is said to be in the facility tonight, order placed, and informed by charge RN that IV team has been notified and will get a new IV for patient. Patient informed.

## 2019-10-04 NOTE — Progress Notes (Signed)
Spoke to patient's fiance Brenice on the phone and updated her on plan of care. Said she would call and notifiy Patient's mother.

## 2019-10-04 NOTE — Plan of Care (Signed)

## 2019-10-04 NOTE — Progress Notes (Signed)
Occupational Therapy Evaluation Patient Details Name: Russell Carter MRN: [id number] DOB: 1956/01/17 Today's Date: 10/04/2019    History of Present Illness Pt is a 63 y.o. male recently admitted to [facility] four days with d/c home 09/29/19, now readmitted 10/04/2019 with worsening SOB and headache. PMH includes seizures, CAD, stroke, heart block, cervical stenosis s/p neck sx with subsequent L-side pain/weakness, CKD, DM2, substance abuse, chronic opiate use.   Clinical Impression   PTA pt lived with his fiance, independent in mobility and requiring only min assist for upper body dressing. Pt reports ambulating with a cane for short distances, using a RW for longer distances. Pt does not use oxygen at home and is currently on 3L [place]. Pt currently requires variable setup to min assist for self-care and functional transfer tasks. Pt able to ambulate to/from bathroom with RW and variable min guard to min assist for balance. Pt too fatigued to complete grooming/hygiene tasks at the sink this date. Pt's SpO2 maintained in 90s on 3L [place] throughout all tasks. 2/4 DOE. Educated pt on safety strategies, activity modifications, and energy conservation techniques. Educated pt on relaxation strategies to address anxiety related to shortness of breath. Pt demonstrates decreased strength, endurance, balance, standing tolerance, and activity tolerance impacting ability to complete self-care and functional transfer tasks. Recommend skilled OT services to address above deficits in order to promote function and prevent further decline.     Follow Up Recommendations  No OT follow up    Equipment Recommendations  None recommended by OT    Recommendations for Other Services       Precautions / Restrictions Precautions Precautions: Fall Restrictions Weight Bearing Restrictions: No      Mobility Bed Mobility               General bed mobility comments: (Pt found sitting in the chair upon OT  arrival)  Transfers Overall transfer level: Needs assistance Equipment used: Rolling walker (2 wheeled) Transfers: Sit to/from Stand Sit to Stand: Min guard;Min assist         General transfer comment: Pt able to ambulate to/from bathroom with RW and variable min guard to min assist     Balance Overall balance assessment: Needs assistance   Sitting balance-Leahy Scale: Good       Standing balance-Leahy Scale: Fair                             ADL either performed or assessed with clinical judgement   ADL Overall ADL's : Needs assistance/impaired Eating/Feeding: Independent;Sitting   Grooming: Set up;Sitting   Upper Body Bathing: Sitting;Supervision/ safety;Set up   Lower Body Bathing: Min guard;Sit to/from stand   Upper Body Dressing : Sitting;Minimal assistance   Lower Body Dressing: Min guard;Sit to/from stand   Toilet Transfer: Min guard;Ambulation;Regular Toilet;Grab bars   Toileting- Clothing Manipulation and Hygiene: Min guard;Sit to/from stand       Functional mobility during ADLs: Min guard;Minimal assistance;Rolling walker       Vision Baseline Vision/History: Wears glasses       Perception     Praxis      Pertinent Vitals/Pain Pain Assessment: 0-10 Pain Score: 8  Pain Location: bilateral shoulders, neck, and back Pain Descriptors / Indicators: Discomfort Pain Intervention(s): Monitored during session;Premedicated before session     Hand Dominance Right   Extremity/Trunk Assessment Upper Extremity Assessment Upper Extremity Assessment: Generalized weakness   Lower Extremity Assessment Lower Extremity Assessment: Defer to PT evaluation  Communication Communication Communication: No difficulties   Cognition Arousal/Alertness: Awake/alert   Overall Cognitive Status: No family/caregiver present to determine baseline cognitive functioning                                     General Comments  Pt's O2  SATs maintained in 90s throughout on 3L [place].    Exercises Exercises: Other exercises Other Exercises Other Exercises: Incentive spirometer x 10 with mod cues on technique. Pulling . Other Exercises: Flutter valve x 10 with mod cues on technique. Other Exercises: Pursed lip breathing with mod cues on technique.   Shoulder Instructions      Home Living Family/patient expects to be discharged to:: Private residence Living Arrangements: Spouse/significant other(fiance) Available Help at Discharge: Family;Available PRN/intermittently Type of Home: House Home Access: Stairs to enter [employer] of Steps: 3 Entrance Stairs-Rails: Right;Left Home Layout: One level     Bathroom Shower/Tub: Chief Strategy Officer: Standard     Home Equipment: Cane - single point;Walker - 2 wheels;Shower seat          Prior Functioning/Environment Level of Independence: Independent        Comments: Pt uses cane for short distances and RW for long distances. Fiance does IADLs. Pt does not drive. Pt reports 3 falls in the last 6 months. Pt requires min assist for managing buttons on shirt, otherwise independent in ADLs.         OT Problem List: Decreased strength;Decreased activity tolerance;Impaired balance (sitting and/or standing);Decreased safety awareness;Decreased knowledge of use of DME or AE;Cardiopulmonary status limiting activity      OT Treatment/Interventions: Self-care/ADL training;Therapeutic exercise;Neuromuscular education;Energy conservation;DME and/or AE instruction;Therapeutic activities;Patient/family education;Balance training    OT Goals(Current goals can be found in the care plan section) Acute Rehab OT Goals Patient Stated Goal: To go home Time For Goal Achievement: 10/18/19 Potential to Achieve Goals: Good ADL Goals Pt Will Perform Grooming: with modified independence;standing Pt Will Perform Lower Body Bathing: with modified independence;sit  to/from stand Pt Will Perform Lower Body Dressing: with modified independence;sit to/from stand Pt Will Transfer to Toilet: with modified independence;ambulating;regular height toilet Pt Will Perform Toileting - Clothing Manipulation and hygiene: with modified independence;sit to/from stand Additional ADL Goal #1: Pt to recall and demonstrate breathing exercises with 0 verbal cues.  OT Frequency: Min 3X/week   Barriers to D/C:            Co-evaluation              AM-PAC OT "6 Clicks" Daily Activity     Outcome Measure Help from another person eating meals?: None Help from another person taking care of personal grooming?: A Little Help from another person toileting, which includes using toliet, bedpan, or urinal?: A Little Help from another person bathing (including washing, rinsing, drying)?: A Little Help from another person to put on and taking off regular upper body clothing?: A Little Help from another person to put on and taking off regular lower body clothing?: A Little 6 Click Score: 19   End of Session Equipment Utilized During Treatment: Gait belt;Rolling walker;Oxygen Nurse Communication: Mobility status  Activity Tolerance: Patient limited by fatigue(Limited by SOB) Patient left: in chair;with call bell/phone within reach  OT Visit Diagnosis: Unsteadiness on feet (R26.81);Muscle weakness (generalized) (M62.81)                Time: 4709-6283 OT Time Calculation (  min): 43 min Charges:  OT General Charges $OT Visit: 1 Visit OT Evaluation $OT Eval Moderate Complexity: 1 Mod OT Treatments $Self Care/Home Management : 8-22 mins $Therapeutic Exercise: 8-22 mins  Mauri Brooklyn OTR/L [phone]   Mauri Brooklyn 10/04/2019, 1:10 PM

## 2019-10-04 NOTE — Progress Notes (Signed)
Patient oxygenating 90-92% on 2L [place], IS up to 1000. Sugar better controlled this AM. Resting comfortably in bed with usual chronic pain in neck. Due for second dose of remdesiver today.

## 2019-10-04 NOTE — Progress Notes (Signed)
PROGRESS NOTE  Russell AnonMichael Carter MVH:[id number] DOB: 1956/09/23 DOA: 10/17/2019  PCP: [name], [name], MD  Brief History/Interval Summary: 63 y.o. male with medical history significant of CAD, osteoarthritis, polysubstance abuse, chronic anticoagulation, diabetes, cocaine abuse, hyperlipidemia, cervical stenosis, anxiety disorder, hypertension, chronic kidney disease, seizure disorder, history of CVA, and bipolar disorder who was recently admitted with COVID pneumonia, who returned with worsening symptoms.  He was discharged on 12/8.  He was no longer hypoxic.  His inflammatory markers have become normal.  Due to hypoxia patient was hospitalized for further management.  Reason for Visit: Pneumonia due to COVID-19.  Acute respiratory failure with hypoxia  Consultants: None  Procedures: None  Antibiotics: Anti-infectives (From admission, onward)   Start     Dose/Rate Route Frequency Ordered Stop   10/04/19 1000  remdesivir 100 mg in sodium chloride 0.9 % 100 mL IVPB     100 mg 200 mL/hr over 30 Minutes Intravenous Daily 10/03/19 1148 10/08/19 0959   10/03/19 1400  remdesivir 200 mg in sodium chloride 0.9% 250 mL IVPB     200 mg 580 mL/hr over 30 Minutes Intravenous Once 10/03/19 1148 10/03/19 1415      Subjective/Interval History: Patient noted to be slightly distracted this morning.  He states that he is only about 20% better.  Still having some difficulty breathing.  Denies any chest pain.  Has some pain in the right side of his abdomen.  This has been present on and off for a long time per the patient.  Some nausea is also present.    Assessment/Plan:  Acute Hypoxic Resp. Failure/Pneumonia due to COVID-19  COVID-19 Labs  Lab Results  Component Value Date   SARSCOV2NAA POSITIVE (A) 09/25/2019   SARSCOV2NAA NEGATIVE 08/19/2019   SARSCOV2NAA NEGATIVE 05/11/2019    Recent Labs  Lab 09/28/19 0117 10/09/2019 0915 10/03/19 0120 10/04/19 0500  DDIMER <0.27 0.37 0.37 0.32    FERRITIN  --   --  144 235  CRP 0.8  --  14.8* 8.8*  ALT 28 17 16 16     Objective findings: Fever: Afebrile in the last 24 hours. Oxygen requirements: Nasal cannula.  2 to 3 L/min.  Saturating in the early 90s.  COVID 19 Therapeutics: Antibacterials: None Remdesivir: Second course.  Day 2 today Steroids: Dexamethasone 6 mg every 12 hours Diuretics: Not on scheduled diuretics Actemra: Not given Convalescent Plasma: Not given Vitamin C and Zinc: Continue PUD Prophylaxis: famotidine DVT Prophylaxis: On Eliquis  Patient's respiratory status seems to be stable.  Currently on 2 to 4 L of oxygen by nasal cannula.  His D-dimer is normal.  CRP is improved to 8.8.  Hold off on Actemra and convalescent plasma.  Continue with incentive spirometry, mobilization, prone positioning.  He is on second course of remdesivir day 2 today.  Continue steroids.  Leukocytosis likely due to steroids.  Acute renal failure No known history of kidney disease.  His BUN and creatinine noted to be significantly elevated this morning.  Reason for this is not entirely clear.  Hold his ARB.  We will discontinue other nephrotoxic agents as well.  He does not appear like he received any contrast dye recently.  Was not hypotensive either.  We will check a UA.  Give fluid bolus.  UA from 12/11 was unremarkable.  Right-sided upper abdominal discomfort He is tender in the right upper quadrant.  His LFTs are normal.  Looks like he has had this symptom on and off for a while.  He  underwent biliary evaluation last year including ultrasound as well as HIDA scan both of which were unremarkable.  His LFTs are normal.  Continue with Pepcid for now.  Monitor closely.  History of coronary artery disease Stable.  Denies any chest pain.  Continue aspirin and statin.  History of CVA Continue statin.  Continue anticoagulation with Eliquis.  Diabetes mellitus type 2, uncontrolled with hyperglycemia HbA1c 6.3 in October.  Elevated  CBGs due to steroids.  Continue SSI.  Also on Lantus.  Continue current dose for now.  May need to adjust.  Not noted to be any glucose lowering agents at home.  History of gout Continue allopurinol.  History of bipolar disorder Continue Depakote.  He is also on duloxetine.  Check a Depakote level in the morning.  Essential hypertension Blood pressure is reasonably well controlled.  Occasional low readings noted.  Stop his amlodipine.  ARB discontinued due to AKI.    History of BPH Continue Flomax.   DVT Prophylaxis: On Eliquis Code Status: Full code Family Communication: Discussed with the patient Disposition Plan: Management as outlined above.  Hopefully will be able to return home when improved.  Mobilize.   Medications:  Scheduled: . allopurinol  300 mg Oral Daily  . amLODipine  5 mg Oral Daily  . apixaban  5 mg Oral BID  . aspirin EC  81 mg Oral Daily  . calcium-vitamin D  1 tablet Oral Daily  . dexamethasone (DECADRON) injection  6 mg Intravenous Q12H  . divalproex  1,000 mg Oral QHS  . divalproex  500 mg Oral q morning - 10a  . DULoxetine  30 mg Oral BID  . famotidine  20 mg Oral BID  . folic acid  1 mg Oral Daily  . gabapentin  200 mg Oral BID  . insulin aspart  0-5 Units Subcutaneous QHS  . insulin aspart  0-9 Units Subcutaneous TID WC  . insulin glargine  8 Units Subcutaneous Daily  . Ipratropium-Albuterol  1 puff Inhalation Q6H  . lacosamide  100 mg Oral BID  . Magnesium Oxide  200 mg Oral QHS  . metoCLOPramide  10 mg Oral TID AC  . multivitamin with minerals  1 tablet Oral Daily  . oxyCODONE-acetaminophen  1 tablet Oral TID   And  . oxyCODONE  2.5 mg Oral TID  . rosuvastatin  40 mg Oral Daily  . tamsulosin  0.4 mg Oral QHS  . thiamine  100 mg Oral Daily  . vitamin C  500 mg Oral Daily  . zinc sulfate  220 mg Oral Daily   Continuous: . sodium chloride     Followed by  . sodium chloride    . remdesivir 100 mg in NS 100 mL 100 mg (10/04/19 2130)    QMV:HQIONGEXBMWUX, nitroGLYCERIN, ondansetron **OR** ondansetron (ZOFRAN) IV, senna-docusate   Objective:  Vital Signs  Vitals:   10/03/19 2128 10/04/19 0348 10/04/19 0349 10/04/19 0742  BP: 100/65 95/66  110/71  Pulse:  64 63 63  Resp:  19 17   Temp: 98.4 F (36.9 C) 98 F (36.7 C)  98.3 F (36.8 C)  TempSrc: Oral Oral  Oral  SpO2: 92% (!) 85% 96% 95%  Weight:      Height:        Intake/Output Summary (Last 24 hours) at 10/04/2019 1028 Last data filed at 10/03/2019 1345 Gross per 24 hour  Intake 610 ml  Output --  Net 610 ml   Filed Weights   10/03/19 1642  Weight: 95.3 kg    General appearance: Awake alert.  In no distress.  Mildly distracted Resp: Tachypneic at rest.  No use of accessory muscles.  Coarse breath sound bilaterally.  Few crackles at the bases. Cardio: S1-S2 is normal regular.  No S3-S4.  No rubs murmurs or bruit GI: Abdomen soft.  Mildly tender in the right upper quadrant.  Murphy sign negative.  No masses organomegaly.  No rebound rigidity or guarding.  Bowel sounds present normal. Extremities: No edema.  Full range of motion of lower extremities. Neurologic: Alert and oriented x3.  No focal neurological deficits.     Lab Results:  Data Reviewed: I have personally reviewed following labs and imaging studies  CBC: Recent Labs  Lab 09/28/19 0117 10/12/2019 0915 10/03/19 0120 10/04/19 0500  WBC 6.0 6.3 6.7 15.0*  NEUTROABS 4.5 5.2 6.1 13.1*  HGB 13.8 16.2 15.6 14.5  HCT 43.8 51.4 49.0 46.3  MCV 88.8 88.8 87.2 88.7  PLT 206 178 190 227    Basic Metabolic Panel: Recent Labs  Lab 09/28/19 0117 10/01/2019 0915 10/03/19 0120 10/04/19 0500  NA 139 137 139 136  K 4.4 3.9 4.2 5.0  CL 107 98 104 98  CO2 22 24 22 23   GLUCOSE 139* 94 156* 151*  BUN 23 13 16  56*  CREATININE 1.13 1.18 1.12 3.34*  CALCIUM 8.2* 9.1 8.8* 9.0  MG 2.1  --  2.2 2.8*  PHOS  --   --  3.1  --     GFR: Estimated Creatinine Clearance: 26.3 mL/min (A) (by C-G  formula based on SCr of 3.34 mg/dL (H)).  Liver Function Tests: Recent Labs  Lab 09/28/19 0117 09/24/2019 0915 10/03/19 0120 10/04/19 0500  AST 23 22 21 21   ALT 28 17 16 16   ALKPHOS 39 44 40 38  BILITOT 0.3 0.8 0.7 0.3  PROT 6.6 7.8 7.5 7.0  ALBUMIN 3.3* 3.5 3.1* 2.9*    CBG: Recent Labs  Lab 10/03/19 0353 10/03/19 0746 10/03/19 1217 10/04/19 0747 10/04/19 0803  GLUCAP 202* 226* 285* 215* 211*     Anemia Panel: Recent Labs    10/03/19 0120 10/04/19 0500  FERRITIN 144 235    Recent Results (from the past 240 hour(s))  Urine culture     Status: None   Collection Time: 09/25/19  9:00 PM   Specimen: Urine, Clean Catch  Result Value Ref Range Status   Specimen Description URINE, CLEAN CATCH  Final   Special Requests NONE  Final   Culture   Final    NO GROWTH Performed at [facility], [address], [place], [place] [address]    Report Status 09/27/2019 FINAL  Final  SARS CORONAVIRUS 2 (TAT 6-24 HRS) Nasopharyngeal Nasopharyngeal Swab     Status: Abnormal   Collection Time: 09/25/19  9:38 PM   Specimen: Nasopharyngeal Swab  Result Value Ref Range Status   SARS Coronavirus 2 POSITIVE (A) NEGATIVE Final    Comment: RESULT CALLED TO, READ BACK BY AND VERIFIED WITHRoyden Purl RN [phone] 09/26/2019 T. TYSOR (NOTE) SARS-CoV-2 target nucleic acids are DETECTED. The SARS-CoV-2 RNA is generally detectable in upper and lower respiratory specimens during the acute phase of infection. Positive results are indicative of the presence of SARS-CoV-2 RNA. Clinical correlation with patient history and other diagnostic information is  necessary to determine patient infection status. Positive results do not rule out bacterial infection or co-infection with other viruses.  The expected result is Negative. Fact Sheet for  Patients: HairSlick.no Fact Sheet for Healthcare Providers: quierodirigir.com This test is  not yet approved or cleared by the [place] FDA and  has been authorized for detection and/or diagnosis of SARS-CoV-2 by FDA under an Emergency Use Authorization (EUA). This EUA will remain  in effect (meaning this test can be use d) for the duration of the COVID-19 declaration under Section 564(b)(1) of the Act, 21 U.S.C. section 360bbb-3(b)(1), unless the authorization is terminated or revoked sooner. Performed at [facility], [address], [place], [place] [address]   Blood Culture (routine x 2)     Status: None   Collection Time: 09/26/19 12:45 AM   Specimen: BLOOD  Result Value Ref Range Status   Specimen Description BLOOD LEFT ARM  Final   Special Requests   Final    BOTTLES DRAWN AEROBIC AND ANAEROBIC Blood Culture adequate volume   Culture   Final    NO GROWTH 5 DAYS Performed at [facility], [address], [place], [place] [address]    Report Status 10/01/2019 FINAL  Final  MRSA PCR Screening     Status: None   Collection Time: 10/22/19  6:12 PM   Specimen: Nasopharyngeal  Result Value Ref Range Status   MRSA by PCR NEGATIVE NEGATIVE Final    Comment:        The GeneXpert MRSA Assay (FDA approved for NASAL specimens only), is one component of a comprehensive MRSA colonization surveillance program. It is not intended to diagnose MRSA infection nor to guide or monitor treatment for MRSA infections. Performed at [facility], [address] [place], [place] [address]       Radiology Studies: CT Head Wo Contrast  Result Date: 22-Oct-2019 CLINICAL DATA:  64 year old male with 1 week of headache. Two days of shortness of breath. Positive for COVID-19. EXAM: CT HEAD WITHOUT CONTRAST TECHNIQUE: Contiguous axial images were obtained from the base of the skull through the vertex without intravenous contrast. COMPARISON:  09/25/2019 head CT. Brain MRI 12/25/2018 FINDINGS: Brain: Stable cerebral volume. No midline shift,  ventriculomegaly, mass effect, evidence of mass lesion, intracranial hemorrhage or evidence of cortically based acute infarction. Stable gray-white matter differentiation with mild periventricular white matter hypodensity, stable compared to the March MRI. Vascular: Mild Calcified atherosclerosis at the skull base. No suspicious intracranial vascular hyperdensity. Skull: Negative. Sinuses/Orbits: Visualized paranasal sinuses and mastoids are stable and well pneumatized. Other: Mildly Disconjugate gaze, otherwise negative orbits. No acute scalp soft tissue finding. IMPRESSION: Stable largely negative for age non contrast CT appearance of the brain. No acute intracranial abnormality. Electronically Signed   By: Odessa Fleming M.D.   On: 10-22-2019 11:34       LOS: 2 days   Grayton Lobo Rito Ehrlich  [facility] Pager on [email or web address]  10/04/2019, 10:28 AM

## 2019-10-05 ENCOUNTER — Inpatient Hospital Stay: Payer: Self-pay

## 2019-10-05 LAB — COMPREHENSIVE METABOLIC PANEL
ALT: 14 U/L (ref 0–44)
AST: 19 U/L (ref 15–41)
Albumin: 2.9 g/dL — ABNORMAL LOW (ref 3.5–5.0)
Alkaline Phosphatase: 40 U/L (ref 38–126)
Anion gap: 14 (ref 5–15)
BUN: 72 mg/dL — ABNORMAL HIGH (ref 8–23)
CO2: 23 mmol/L (ref 22–32)
Calcium: 8.6 mg/dL — ABNORMAL LOW (ref 8.9–10.3)
Chloride: 98 mmol/L (ref 98–111)
Creatinine, Ser: 3.29 mg/dL — ABNORMAL HIGH (ref 0.61–1.24)
GFR calc Af Amer: 22 mL/min — ABNORMAL LOW (ref >60)
GFR calc non Af Amer: 19 mL/min — ABNORMAL LOW (ref >60)
Glucose, Bld: 132 mg/dL — ABNORMAL HIGH (ref 70–99)
Potassium: 4.7 mmol/L (ref 3.5–5.1)
Sodium: 135 mmol/L (ref 135–145)
Total Bilirubin: 0.8 mg/dL (ref 0.3–1.2)
Total Protein: 6.5 g/dL (ref 6.5–8.1)

## 2019-10-05 LAB — CBC WITH DIFFERENTIAL/PLATELET
Abs Immature Granulocytes: 0.14 10*3/uL — ABNORMAL HIGH (ref 0.00–0.07)
Basophils Absolute: 0 10*3/uL (ref 0.0–0.1)
Basophils Relative: 0 %
Eosinophils Absolute: 0 10*3/uL (ref 0.0–0.5)
Eosinophils Relative: 0 %
HCT: 43.8 % (ref 39.0–52.0)
Hemoglobin: 13.8 g/dL (ref 13.0–17.0)
Immature Granulocytes: 1 %
Lymphocytes Relative: 5 %
Lymphs Abs: 0.9 10*3/uL (ref 0.7–4.0)
MCH: 27.8 pg (ref 26.0–34.0)
MCHC: 31.5 g/dL (ref 30.0–36.0)
MCV: 88.1 fL (ref 80.0–100.0)
Monocytes Absolute: 0.6 10*3/uL (ref 0.1–1.0)
Monocytes Relative: 3 %
Neutro Abs: 16.6 10*3/uL — ABNORMAL HIGH (ref 1.7–7.7)
Neutrophils Relative %: 91 %
Platelets: 221 10*3/uL (ref 150–400)
RBC: 4.97 MIL/uL (ref 4.22–5.81)
RDW: 14.6 % (ref 11.5–15.5)
WBC: 18.3 10*3/uL — ABNORMAL HIGH (ref 4.0–10.5)
nRBC: 0 % (ref 0.0–0.2)

## 2019-10-05 LAB — C-REACTIVE PROTEIN: CRP: 4.1 mg/dL — ABNORMAL HIGH (ref <1.0)

## 2019-10-05 LAB — GLUCOSE, CAPILLARY
Glucose-Capillary: 301 mg/dL — ABNORMAL HIGH (ref 70–99)
Glucose-Capillary: 228 mg/dL — ABNORMAL HIGH (ref 70–99)
Glucose-Capillary: 174 mg/dL — ABNORMAL HIGH (ref 70–99)
Glucose-Capillary: 112 mg/dL — ABNORMAL HIGH (ref 70–99)
Glucose-Capillary: 235 mg/dL — ABNORMAL HIGH (ref 70–99)
Glucose-Capillary: 140 mg/dL — ABNORMAL HIGH (ref 70–99)
Glucose-Capillary: 253 mg/dL — ABNORMAL HIGH (ref 70–99)

## 2019-10-05 LAB — D-DIMER, QUANTITATIVE: D-Dimer, Quant: 0.33 ug/mL-FEU (ref 0.00–0.50)

## 2019-10-05 LAB — FERRITIN: Ferritin: 198 ng/mL (ref 24–336)

## 2019-10-05 LAB — VALPROIC ACID LEVEL: Valproic Acid Lvl: 46 ug/mL — ABNORMAL LOW (ref 50.0–100.0)

## 2019-10-05 LAB — MAGNESIUM: Magnesium: 2.7 mg/dL — ABNORMAL HIGH (ref 1.7–2.4)

## 2019-10-05 MED ORDER — OXYCODONE HCL 5 MG PO TABS: 2.5000 mg | ORAL_TABLET | Freq: Three times a day (TID) | ORAL | Status: DC

## 2019-10-05 MED ORDER — SODIUM CHLORIDE 0.9% FLUSH
10.0000 mL | INTRAVENOUS | Status: DC | PRN
  Administered 2019-10-20: 20 mL
  Administered 2019-10-21 (×2): 40 mL

## 2019-10-05 MED ORDER — OXYCODONE-ACETAMINOPHEN 5-325 MG PO TABS: 1.0000 | ORAL_TABLET | Freq: Three times a day (TID) | ORAL | Status: DC

## 2019-10-05 MED ORDER — CHLORHEXIDINE GLUCONATE CLOTH 2 % EX PADS
6.0000 | MEDICATED_PAD | Freq: Every day | CUTANEOUS | Status: DC
  Administered 2019-10-05 – 2019-10-30 (×26): 6 via TOPICAL

## 2019-10-05 MED ORDER — SODIUM CHLORIDE 0.9% FLUSH
10.0000 mL | Freq: Two times a day (BID) | INTRAVENOUS | Status: DC
  Administered 2019-10-05 (×2): 10 mL
  Administered 2019-10-06: 20 mL
  Administered 2019-10-06 – 2019-10-08 (×4): 10 mL
  Administered 2019-10-08: 09:00:00 30 mL
  Administered 2019-10-09: 23:00:00 10 mL
  Administered 2019-10-09: 09:00:00 30 mL
  Administered 2019-10-10: 10:00:00 10 mL
  Administered 2019-10-10: 22:00:00 20 mL
  Administered 2019-10-11 – 2019-10-14 (×7): 10 mL
  Administered 2019-10-15: 20 mL
  Administered 2019-10-15 – 2019-10-16 (×2): 10 mL
  Administered 2019-10-16: 20 mL
  Administered 2019-10-17 – 2019-10-18 (×3): 10 mL
  Administered 2019-10-19: 22:00:00 20 mL
  Administered 2019-10-19 – 2019-10-25 (×13): 10 mL
  Administered 2019-10-26: 10:00:00 20 mL
  Administered 2019-10-26 – 2019-11-01 (×10): 10 mL

## 2019-10-05 MED ORDER — LACOSAMIDE 50 MG PO TABS
50.0000 mg | ORAL_TABLET | Freq: Two times a day (BID) | ORAL | Status: DC
  Administered 2019-10-06 – 2019-10-09 (×7): 50 mg via ORAL
  Filled 2019-10-05 (×7): qty 1

## 2019-10-05 MED ORDER — THIAMINE HCL 100 MG/ML IJ SOLN
100.0000 mg | Freq: Every day | INTRAMUSCULAR | Status: DC
  Administered 2019-10-06 – 2019-10-07 (×2): 100 mg via INTRAVENOUS
  Filled 2019-10-05 (×2): qty 1

## 2019-10-05 NOTE — Progress Notes (Signed)
Patient has gone through multiple PIV during this hospitalization. Many placed by [facility]. Patient is CKD per progress notes. Secure chat sent to Dr. Maryland Pink concerning CKD and placement of Midline or PICC. Dr. Maryland Pink informed of [facility] requirements concerning renal patients. Order for PICC placed. PICC team informed of order. Primary nurse, Anderson Malta, RN, was informed of PICC order.

## 2019-10-05 NOTE — Progress Notes (Signed)
IV team on unit and states they will try to get my patient, but was told they will only get to 3 patients.

## 2019-10-05 NOTE — TOC Progression Note (Signed)
Transition of Care [facility]) - Progression Note    Patient Details  Name: Kelyn Koskela MRN: [id number] Date of Birth: 1955/11/09  Transition of Care [facility]) CM/SW Contact  Joaquin Courts, RN Phone Number: 10/05/2019, 3:00 PM  Clinical Narrative:    CM received message that patient care coordinator from [facility] called to inquire about the patient. CM reached out to care coordinator Rollene Fare and discussed difficulty with finding [facility] provider. Rollene Fare reported that she will pass this on the case manager at [employer] Delfin Gant [phone]).  CM reached out to [facility], [facility], [facility], and [facility] in addition to previous agencies attempted. While all three are in network, none are able to provide care to patient due to staffing.  CM unable to find a [place] agency at this time. Will await call from [facility] for further assistance.         Expected Discharge Plan and Services           Expected Discharge Date: (unknown)                                     Social Determinants of Health (SDOH) Interventions    Readmission Risk Interventions Readmission Risk Prevention Plan 09/29/2019  Transportation Screening Complete  Medication Review Press photographer) Complete  PCP or Specialist appointment within 3-5 days of discharge Not Complete  PCP/Specialist Appt Not Complete comments called and was on hold for over 15 minutes  HRI or [place] Complete  SW Recovery Care/Counseling Consult Complete  [place] Not Applicable  Some recent data might be hidden

## 2019-10-05 NOTE — Consult Note (Addendum)
   [facility] CM Inpatient Consult   10/05/2019  Chez Dermody 1956/06/19 [id number]    Patientchecked for less than 7 day readmission and for 55% extreme highrisk score of unplanned readmission, with 4 hospitalizations and 4 ED visits in the past 6 months, as a benefit under his [employer] plan.   Per MD brief summary, patient had been recently admitted with COVID pneumonia, who returned with worsening symptoms.   He was discharged on 12/8, no longer hypoxic, and his inflammatory markers have become normal.   Due to hypoxia patient was hospitalized for further management. He was readmitted back at [facility]) for Pneumonia due to COVID-19,Acute respiratory failure with hypoxia.  Brief chart review shows that patient remains to be currently NOT a beneficiary of the attributed [place] in the [employer].   Patient is Notcovered for [employer].   Reason:Not a beneficiary currently attributed to one of the [place] Registry populations. Membership roster was used to verify this status.  Will sign off.   For additional questions, please contact:  Malaysha Arlen A. Sandeep Delagarza, BSN, RN-BC [facility] Liaison Cell: [phone]

## 2019-10-05 NOTE — Progress Notes (Signed)
IV nurse in room. Has tried twice, but continues to search for a vein for an IV. May need PICC due to poor vasculature.

## 2019-10-05 NOTE — Progress Notes (Signed)
Peripherally Inserted Central Catheter/Midline Placement  The IV Nurse has discussed with the patient and/or persons authorized to consent for the patient, the purpose of this procedure and the potential benefits and risks involved with this procedure.  The benefits include less needle sticks, lab draws from the catheter, and the patient may be discharged home with the catheter. Risks include, but not limited to, infection, bleeding, blood clot (thrombus formation), and puncture of an artery; nerve damage and irregular heartbeat and possibility to perform a PICC exchange if needed/ordered by physician.  Alternatives to this procedure were also discussed.  Bard Power PICC patient education guide, fact sheet on infection prevention and patient information card has been provided to patient /or left at bedside.    PICC/Midline Placement Documentation  PICC Double Lumen 10/05/19 PICC Right Brachial 39 cm 2 cm (Active)       Russell Carter 10/05/2019, 2:57 PM

## 2019-10-05 NOTE — Progress Notes (Signed)
No IV in place at this time. Attempted 2 more attempts by this nurse with no success. Spoke with the charge nurse and she stated they were finishing IV consults at [facility] main campus, and then will be coming to [facility] to place IVs in our patients. Patient remains in bed, call bell in reach, bed in lowest position.

## 2019-10-05 NOTE — Progress Notes (Addendum)
PROGRESS NOTE  Fletcher AnonMichael Olivero ZOX:[id number] DOB: [birth year] DOA: 03/04/19  PCP: [name], [name], MD  Brief History/Interval Summary: 63 y.o. male with medical history significant of CAD, osteoarthritis, polysubstance abuse, chronic anticoagulation, diabetes, cocaine abuse, hyperlipidemia, cervical stenosis, anxiety disorder, hypertension, chronic kidney disease, seizure disorder, history of CVA, and bipolar disorder who was recently admitted with COVID pneumonia, who returned with worsening symptoms.  He was discharged on 12/8.  He was no longer hypoxic.  His inflammatory markers have become normal.  Due to hypoxia patient was hospitalized for further management.  Reason for Visit: Pneumonia due to COVID-19.  Acute respiratory failure with hypoxia  Consultants: None  Procedures: None  Antibiotics: Anti-infectives (From admission, onward)   Start     Dose/Rate Route Frequency Ordered Stop   10/04/19 1000  remdesivir 100 mg in sodium chloride 0.9 % 100 mL IVPB     100 mg 200 mL/hr over 30 Minutes Intravenous Daily 10/03/19 1148 10/08/19 0959   10/03/19 1400  remdesivir 200 mg in sodium chloride 0.9% 250 mL IVPB     200 mg 580 mL/hr over 30 Minutes Intravenous Once 10/03/19 1148 10/03/19 1415      Subjective/Interval History: Patient noted to be distracted this morning.  Very slow in answering questions.  Unable to obtain much information from him.   Assessment/Plan:  Acute Hypoxic Resp. Failure/Pneumonia due to COVID-19  COVID-19 Labs  Lab Results  Component Value Date   SARSCOV2NAA POSITIVE (A) 09/25/2019   SARSCOV2NAA NEGATIVE 08/19/2019   SARSCOV2NAA NEGATIVE 05/11/2019    Recent Labs  Lab 01-17-2019 0915 10/03/19 0120 10/04/19 0500 10/05/19 0419  DDIMER 0.37 0.37 0.32 0.33  FERRITIN  --  144 235 198  CRP  --  14.8* 8.8* 4.1*  ALT 17 16 16 14     Objective findings: Fever: He has been afebrile the last 48 hours Oxygen requirements: Nasal cannula.  Noted  to be on 6 L this morning.  Saturating in the mid to late 90s.    COVID 19 Therapeutics: Antibacterials: None Remdesivir: Second course.  Day 3 today Steroids: Dexamethasone 6 mg every 12 hours Diuretics: Not on scheduled diuretics Actemra: Not given Convalescent Plasma: Not given Vitamin C and Zinc: Continue PUD Prophylaxis: famotidine DVT Prophylaxis: On Eliquis  Patient seems to be stable from a respiratory standpoint.  She is noted to be on 6 L of oxygen this morning.  Should be able to turn him down.  D-dimer is normal as is the ferritin.  CRP was as high as 14.8 and has improved to 4.1 today.  LFTs are normal.  Continue with incentive spirometry, mobilization, prone positioning.  He is on second course of remdesivir, day 3 today.  Continue steroids.  Leukocytosis most likely due to steroids.  Acute metabolic encephalopathy Patient noted to be distracted this morning.  He is very slow to answering questions.  No focal neurological deficits appreciated.  He had a CT scan at the time of admission which did not show any acute findings.  This all could be due to medications and acute infection.  Depakote level is subtherapeutic.  Hold some of his other psychotropic agents. He is not tachycardic nor hypertensive.  Unlikely that this is alcohol withdrawal.  Acute renal failure No known history of kidney disease.  Patient's BUN and creatinine were noted to be significantly elevated yesterday.  He was started on IV fluids.  However there has been some issues with IV access so he has not received any fluid  in the last 12 hours.  PICC line has been ordered.  Patient needs IV fluids for his renal failure which is probably due to hypovolemia.  He was noted to have borderline low blood pressure.  UA was ordered but not done yet.  No recent contrast use.  Monitor urine output.  Recheck labs tomorrow.  Creatinine slightly better on today's labs.  Right-sided upper abdominal discomfort Yesterday he was  tender in his right upper quadrant.  LFTs remain normal.  Looks like he has had this symptom on and off for a while.  He underwent biliary evaluation last year including ultrasound as well as HIDA scan both of which were unremarkable.  Continue with Pepcid for now.  Monitor closely.  Has not had any nausea vomiting in the hospital.  History of coronary artery disease Stable.  Denies any chest pain.  Continue aspirin and statin.  History of CVA Continue statin.  Continue anticoagulation with Eliquis.  Diabetes mellitus type 2, uncontrolled with hyperglycemia HbA1c 6.3 in October.  Elevated CBGs due to steroids.  Continue SSI.  Continue Lantus as well.  Monitor CBGs.  Not noted to be on any glucose lowering agents at home.  History of gout Continue allopurinol.  History of bipolar disorder Depakote level is subtherapeutic.  He is also on duloxetine which will be discontinued due to patient's altered mental status.   History of seizure disorder Followed by neurology.  Patient is on Depakote and Vimpat.  Continue Depakote.  Decrease dose of Vimpat for now.  Essential hypertension ARB discontinued due to AKI.  Stopped amlodipine due to borderline low blood pressures.  Blood pressure seems to be stable.  Continue to monitor.     History of BPH Continue Flomax.  Chronic opioid use Noted to be on scheduled Percocet.  Previous records reviewed and he has been on 3 times a day Percocet previously as well.  Continue for now.  Previous history of alcohol abuse Patient mentioned that he has not had any alcohol in several years however it looks like he was drinking till above a few months ago.  Is possible that he may be still having some alcohol.  Change thiamine to IV.    DVT Prophylaxis: On Eliquis Code Status: Full code Family Communication: Discussed with the patient Disposition Plan: Management as outlined above.  Mobilize as tolerated.     Medications:  Scheduled: . allopurinol  300  mg Oral Daily  . apixaban  5 mg Oral BID  . aspirin EC  81 mg Oral Daily  . calcium-vitamin D  1 tablet Oral Daily  . dexamethasone (DECADRON) injection  6 mg Intravenous Q12H  . divalproex  1,000 mg Oral QHS  . divalproex  500 mg Oral q morning - 10a  . famotidine  20 mg Oral Daily  . folic acid  1 mg Oral Daily  . insulin aspart  0-5 Units Subcutaneous QHS  . insulin aspart  0-9 Units Subcutaneous TID WC  . insulin glargine  8 Units Subcutaneous Daily  . Ipratropium-Albuterol  1 puff Inhalation Q6H  . lacosamide  100 mg Oral BID  . Magnesium Oxide  200 mg Oral QHS  . multivitamin with minerals  1 tablet Oral Daily  . oxyCODONE-acetaminophen  1 tablet Oral TID   And  . oxyCODONE  2.5 mg Oral TID  . tamsulosin  0.4 mg Oral QHS  . thiamine  100 mg Oral Daily  . vitamin C  500 mg Oral Daily   Continuous: .  sodium chloride Stopped (10/04/19 2230)  . remdesivir 100 mg in NS 100 mL Stopped (10/04/19 2016)   VVO:HYWVPXTGGYIRS, nitroGLYCERIN, ondansetron **OR** ondansetron (ZOFRAN) IV, senna-docusate   Objective:  Vital Signs  Vitals:   10/04/19 1640 10/04/19 2000 10/05/19 0450 10/05/19 0813  BP: (!) 102/56 113/63 117/79 121/75  Pulse: 67  (!) 59 64  Resp:   12 (!) 22  Temp: 97.8 F (36.6 C) 97.7 F (36.5 C) 97.6 F (36.4 C) 97.7 F (36.5 C)  TempSrc: Oral Oral Oral Oral  SpO2: 92%  97% 97%  Weight:      Height:        Intake/Output Summary (Last 24 hours) at 10/05/2019 1103 Last data filed at 10/05/2019 0958 Gross per 24 hour  Intake 2341.6 ml  Output 225 ml  Net 2116.6 ml   Filed Weights   10/03/19 1642  Weight: 95.3 kg    General appearance: Awake alert.  In no distress.  Noted to be very distracted today slow to respond. Resp: Mildly tachypneic at rest.  No use of accessory muscles.  Coarse breath sounds bilaterally.  No wheezing or rhonchi. Cardio: S1-S2 is normal regular.  No S3-S4.  No rubs murmurs or bruit GI: Abdomen is soft.  Nontender nondistended.   Bowel sounds are present normal.  No masses organomegaly Extremities: No edema.  Moving all his extremities Neurologic: He is alert.  Oriented to year.  Oriented to place.  Slow to respond.  No facial asymmetry.  Tongue is midline.  Motor strength equal bilateral upper and lower extremities.    Lab Results:  Data Reviewed: I have personally reviewed following labs and imaging studies  CBC: Recent Labs  Lab Oct 06, 2019 0915 10/03/19 0120 10/04/19 0500 10/05/19 0419  WBC 6.3 6.7 15.0* 18.3*  NEUTROABS 5.2 6.1 13.1* 16.6*  HGB 16.2 15.6 14.5 13.8  HCT 51.4 49.0 46.3 43.8  MCV 88.8 87.2 88.7 88.1  PLT 178 190 227 221    Basic Metabolic Panel: Recent Labs  Lab October 06, 2019 0915 10/03/19 0120 10/04/19 0500 10/05/19 0419  NA 137 139 136 135  K 3.9 4.2 5.0 4.7  CL 98 104 98 98  CO2 24 22 23 23   GLUCOSE 94 156* 151* 132*  BUN 13 16 56* 72*  CREATININE 1.18 1.12 3.34* 3.29*  CALCIUM 9.1 8.8* 9.0 8.6*  MG  --  2.2 2.8* 2.7*  PHOS  --  3.1  --   --     GFR: Estimated Creatinine Clearance: 26.7 mL/min (A) (by C-G formula based on SCr of 3.29 mg/dL (H)).  Liver Function Tests: Recent Labs  Lab 10-06-2019 0915 10/03/19 0120 10/04/19 0500 10/05/19 0419  AST 22 21 21 19   ALT 17 16 16 14   ALKPHOS 44 40 38 40  BILITOT 0.8 0.7 0.3 0.8  PROT 7.8 7.5 7.0 6.5  ALBUMIN 3.5 3.1* 2.9* 2.9*    CBG: Recent Labs  Lab 10/04/19 0803 10/04/19 1148 10/04/19 1640 10/04/19 1956 10/05/19 0812  GLUCAP 211* 241* 176* 182* 112*     Anemia Panel: Recent Labs    10/04/19 0500 10/05/19 0419  FERRITIN 235 198    Recent Results (from the past 240 hour(s))  Urine culture     Status: None   Collection Time: 09/25/19  9:00 PM   Specimen: Urine, Clean Catch  Result Value Ref Range Status   Specimen Description URINE, CLEAN CATCH  Final   Special Requests NONE  Final   Culture   Final  NO GROWTH Performed at [facility], [address] [place], [place] [address]     Report Status 09/27/2019 FINAL  Final  SARS CORONAVIRUS 2 (TAT 6-24 HRS) Nasopharyngeal Nasopharyngeal Swab     Status: Abnormal   Collection Time: 09/25/19  9:38 PM   Specimen: Nasopharyngeal Swab  Result Value Ref Range Status   SARS Coronavirus 2 POSITIVE (A) NEGATIVE Final    Comment: RESULT CALLED TO, READ BACK BY AND VERIFIED WITHRoyden Purl RN [phone] 09/26/2019 T. TYSOR (NOTE) SARS-CoV-2 target nucleic acids are DETECTED. The SARS-CoV-2 RNA is generally detectable in upper and lower respiratory specimens during the acute phase of infection. Positive results are indicative of the presence of SARS-CoV-2 RNA. Clinical correlation with patient history and other diagnostic information is  necessary to determine patient infection status. Positive results do not rule out bacterial infection or co-infection with other viruses.  The expected result is Negative. Fact Sheet for Patients: HairSlick.no Fact Sheet for Healthcare Providers: quierodirigir.com This test is not yet approved or cleared by the [place] FDA and  has been authorized for detection and/or diagnosis of SARS-CoV-2 by FDA under an Emergency Use Authorization (EUA). This EUA will remain  in effect (meaning this test can be use d) for the duration of the COVID-19 declaration under Section 564(b)(1) of the Act, 21 U.S.C. section 360bbb-3(b)(1), unless the authorization is terminated or revoked sooner. Performed at [facility], [address] [place], [place] [address]   Blood Culture (routine x 2)     Status: None   Collection Time: 09/26/19 12:45 AM   Specimen: BLOOD  Result Value Ref Range Status   Specimen Description BLOOD LEFT ARM  Final   Special Requests   Final    BOTTLES DRAWN AEROBIC AND ANAEROBIC Blood Culture adequate volume   Culture   Final    NO GROWTH 5 DAYS Performed at [facility], [address] [place], [place]  [address]    Report Status 10/01/2019 FINAL  Final  MRSA PCR Screening     Status: None   Collection Time: 10/06/2019  6:12 PM   Specimen: Nasopharyngeal  Result Value Ref Range Status   MRSA by PCR NEGATIVE NEGATIVE Final    Comment:        The GeneXpert MRSA Assay (FDA approved for NASAL specimens only), is one component of a comprehensive MRSA colonization surveillance program. It is not intended to diagnose MRSA infection nor to guide or monitor treatment for MRSA infections. Performed at [facility], [address] [place], [place] [address]       Radiology Studies: [place] EKG SITE RITE  Result Date: 10/05/2019 If Site Rite image not attached, placement could not be confirmed due to current cardiac rhythm.      LOS: 3 days   Kamyiah Colantonio [employer] on [email or web address]  10/05/2019, 11:03 AM

## 2019-10-06 LAB — CBC WITH DIFFERENTIAL/PLATELET
Abs Immature Granulocytes: 0.1 10*3/uL — ABNORMAL HIGH (ref 0.00–0.07)
Basophils Absolute: 0 10*3/uL (ref 0.0–0.1)
Basophils Relative: 0 %
Eosinophils Absolute: 0 10*3/uL (ref 0.0–0.5)
Eosinophils Relative: 0 %
HCT: 42.7 % (ref 39.0–52.0)
Hemoglobin: 13.7 g/dL (ref 13.0–17.0)
Immature Granulocytes: 1 %
Lymphocytes Relative: 4 %
Lymphs Abs: 0.6 10*3/uL — ABNORMAL LOW (ref 0.7–4.0)
MCH: 28.1 pg (ref 26.0–34.0)
MCHC: 32.1 g/dL (ref 30.0–36.0)
MCV: 87.7 fL (ref 80.0–100.0)
Monocytes Absolute: 0.6 10*3/uL (ref 0.1–1.0)
Monocytes Relative: 4 %
Neutro Abs: 12.5 10*3/uL — ABNORMAL HIGH (ref 1.7–7.7)
Neutrophils Relative %: 91 %
Platelets: 213 10*3/uL (ref 150–400)
RBC: 4.87 MIL/uL (ref 4.22–5.81)
RDW: 14.6 % (ref 11.5–15.5)
WBC: 13.7 10*3/uL — ABNORMAL HIGH (ref 4.0–10.5)
nRBC: 0 % (ref 0.0–0.2)

## 2019-10-06 LAB — MAGNESIUM: Magnesium: 2.6 mg/dL — ABNORMAL HIGH (ref 1.7–2.4)

## 2019-10-06 LAB — COMPREHENSIVE METABOLIC PANEL
ALT: 13 U/L (ref 0–44)
AST: 16 U/L (ref 15–41)
Albumin: 2.7 g/dL — ABNORMAL LOW (ref 3.5–5.0)
Alkaline Phosphatase: 38 U/L (ref 38–126)
Anion gap: 10 (ref 5–15)
BUN: 65 mg/dL — ABNORMAL HIGH (ref 8–23)
CO2: 23 mmol/L (ref 22–32)
Calcium: 8.6 mg/dL — ABNORMAL LOW (ref 8.9–10.3)
Chloride: 102 mmol/L (ref 98–111)
Creatinine, Ser: 1.89 mg/dL — ABNORMAL HIGH (ref 0.61–1.24)
GFR calc Af Amer: 43 mL/min — ABNORMAL LOW (ref >60)
GFR calc non Af Amer: 37 mL/min — ABNORMAL LOW (ref >60)
Glucose, Bld: 107 mg/dL — ABNORMAL HIGH (ref 70–99)
Potassium: 4.9 mmol/L (ref 3.5–5.1)
Sodium: 135 mmol/L (ref 135–145)
Total Bilirubin: 0.5 mg/dL (ref 0.3–1.2)
Total Protein: 6.4 g/dL — ABNORMAL LOW (ref 6.5–8.1)

## 2019-10-06 LAB — C-REACTIVE PROTEIN: CRP: 1.7 mg/dL — ABNORMAL HIGH (ref <1.0)

## 2019-10-06 LAB — GLUCOSE, CAPILLARY
Glucose-Capillary: 292 mg/dL — ABNORMAL HIGH (ref 70–99)
Glucose-Capillary: 53 mg/dL — ABNORMAL LOW (ref 70–99)
Glucose-Capillary: 139 mg/dL — ABNORMAL HIGH (ref 70–99)
Glucose-Capillary: 107 mg/dL — ABNORMAL HIGH (ref 70–99)
Glucose-Capillary: 93 mg/dL (ref 70–99)

## 2019-10-06 LAB — D-DIMER, QUANTITATIVE: D-Dimer, Quant: 0.3 ug/mL-FEU (ref 0.00–0.50)

## 2019-10-06 LAB — FERRITIN: Ferritin: 161 ng/mL (ref 24–336)

## 2019-10-06 LAB — AMMONIA: Ammonia: 45 umol/L — ABNORMAL HIGH (ref 9–35)

## 2019-10-06 IMAGING — CT CT CERVICAL SPINE WITHOUT CONTRAST
5 of 8 series · 12 of 33 positions shown, 13 images · non-contrast
Comparison: 12/24/2018

CLINICAL DATA: Trauma

EXAM:
CT HEAD WITHOUT CONTRAST
CT CERVICAL SPINE WITHOUT CONTRAST
TECHNIQUE: Multidetector CT imaging of the head and cervical spine was
performed following the standard protocol without intravenous
contrast. Multiplanar CT image reconstructions of the cervical spine
were also generated.

[Series 5: head 2.0 h70h · axial · 0.43mm/px · z∈[-19,+33]mm · 2 of 80 slices shown]
[im 27/80  bone]
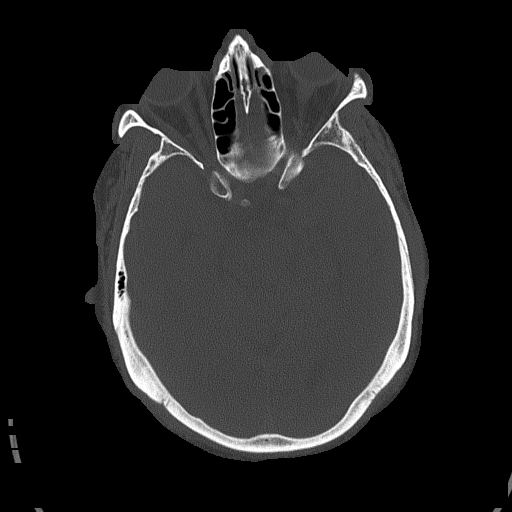
[im 53/80  bone]
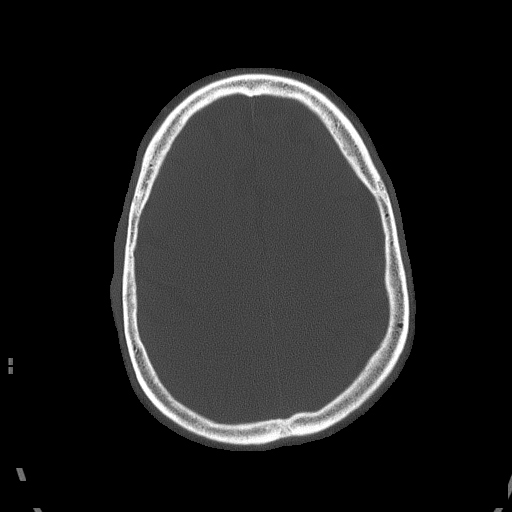

[Series 9: c_spine 2.0 st · axial · 0.30mm/px · z∈[-180,-124]mm · 2 of 86 slices shown, 3 images]
[im 29/86  soft-tissue]
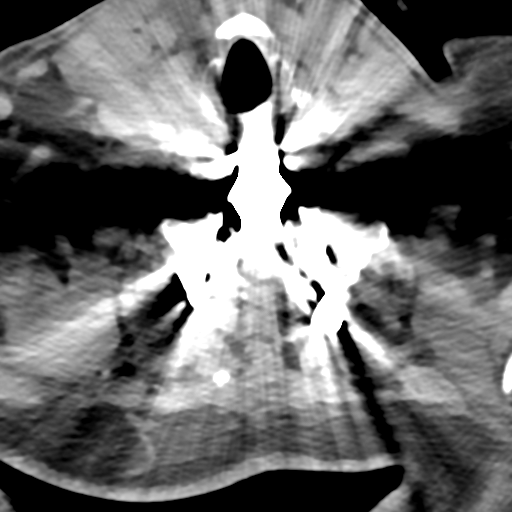
[im 29/86  bone]
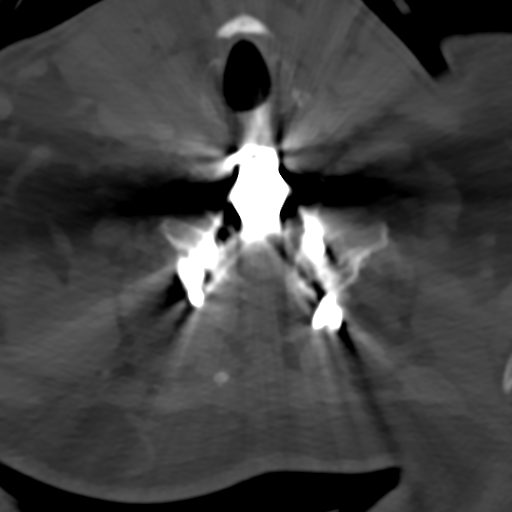
[im 57/86  bone]
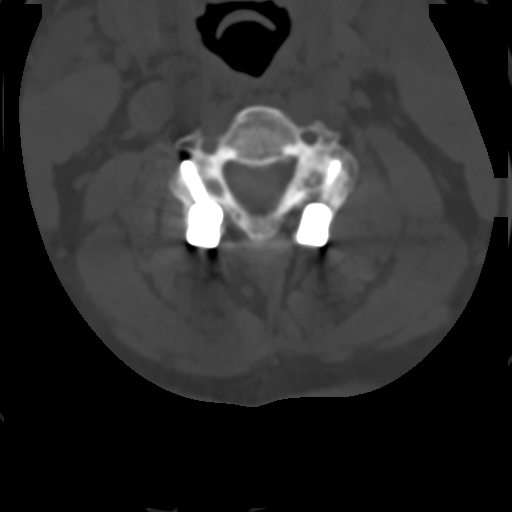

[Series 10: coronal bone · coronal · 0.24mm/px · 1 of 62 slices shown]
[im 31/62  bone]
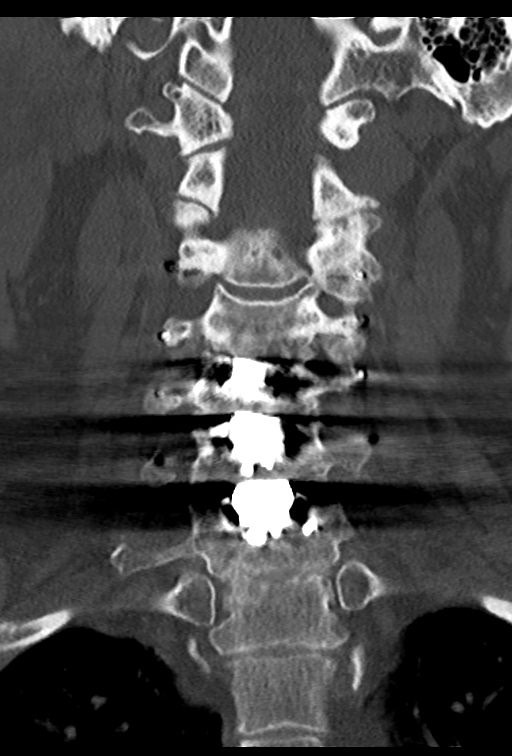

[Series 11: sagittal bone · sagittal · 0.25mm/px · 5 of 61 slices shown]
[im 11/61  bone]
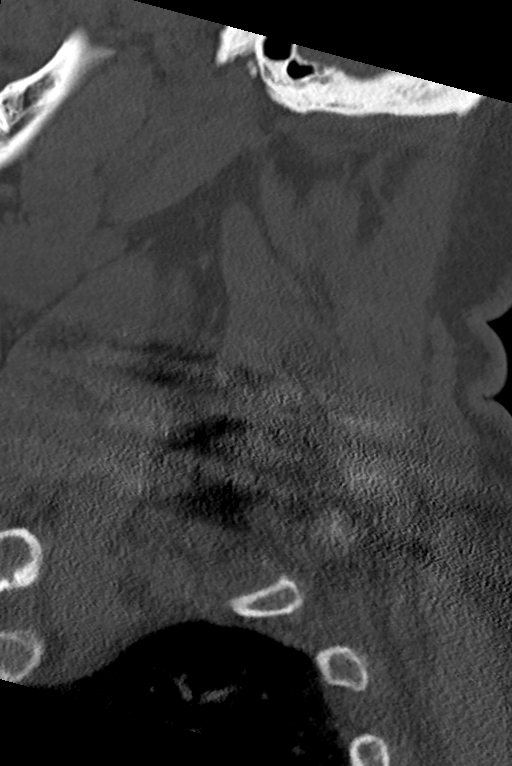
[im 21/61  bone]
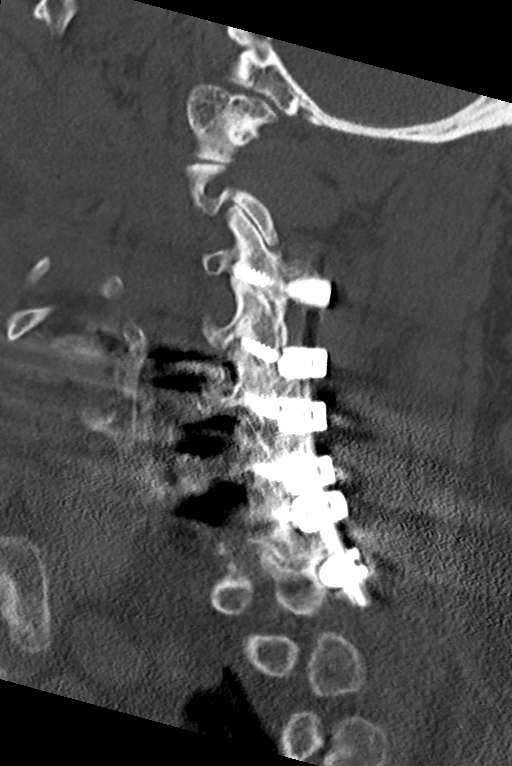
[im 31/61  bone]
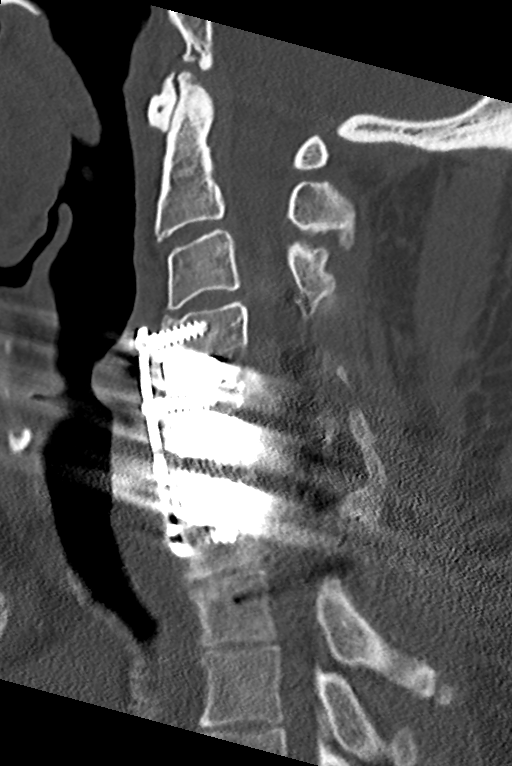
[im 41/61  bone]
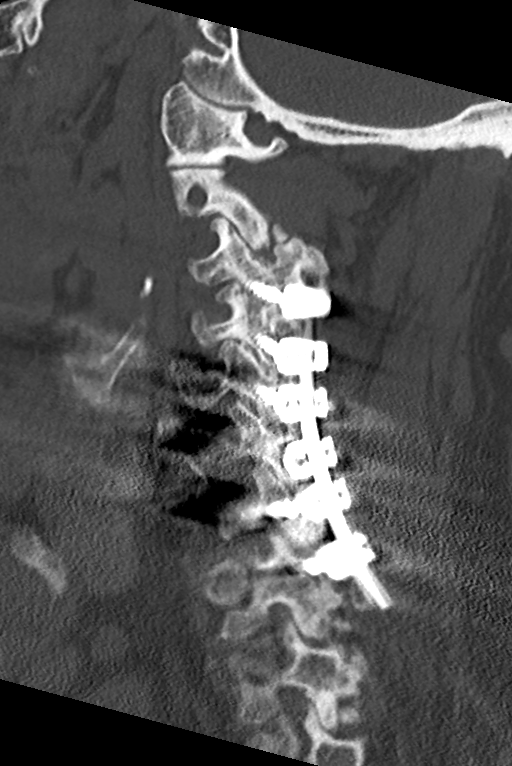
[im 51/61  bone]
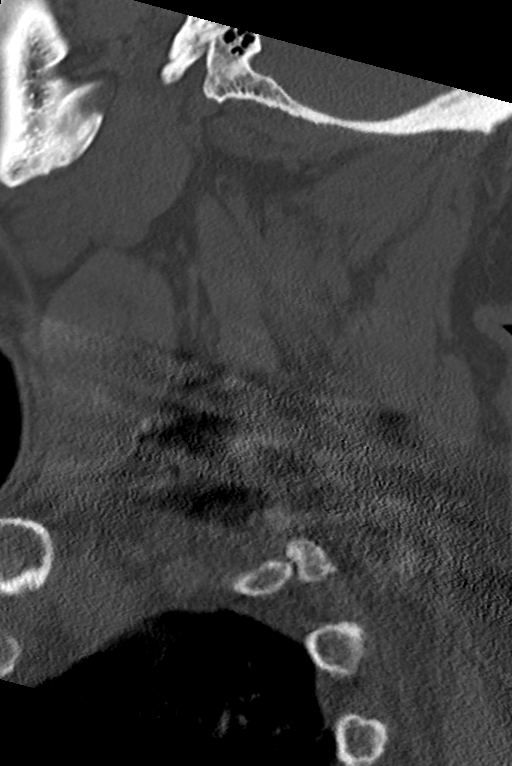

[Series 13: orthogonal axial st · axial · 0.21mm/px · z∈[-197,-136]mm · 2 of 92 slices shown]
[im 31/92  bone]
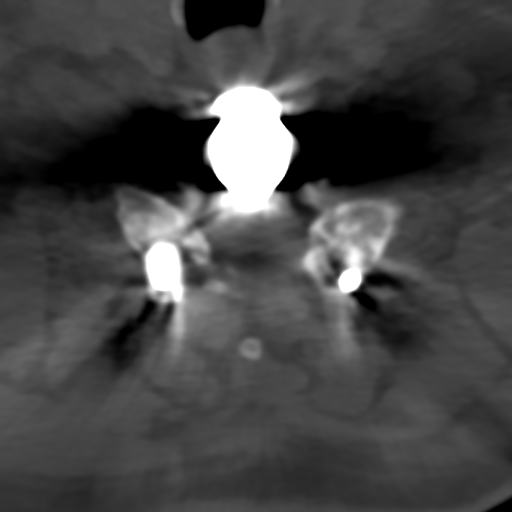
[im 61/92  bone]
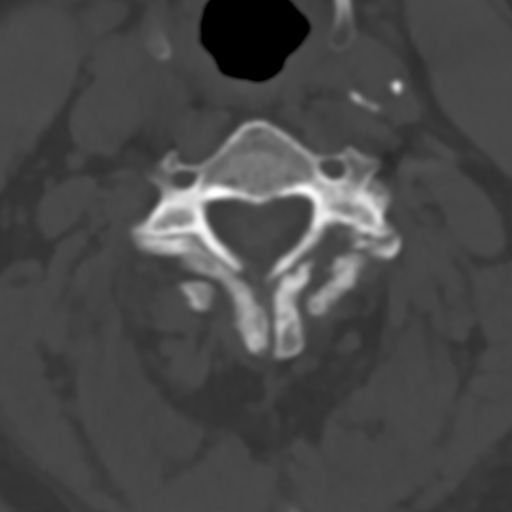

[12 of 33 positions shown; findings below may reference images not displayed]

FINDINGS: CT HEAD FINDINGS

Brain: No evidence of acute infarction, hemorrhage, hydrocephalus,
extra-axial collection or mass lesion/mass effect. Periventricular
white matter hypodensity.

Vascular: No hyperdense vessel or unexpected calcification.

Skull: Normal. Negative for fracture or focal lesion.

Sinuses/Orbits: No acute finding.

Other: None.

CT CERVICAL SPINE FINDINGS

Alignment: Normal.

Skull base and vertebrae: No acute fracture. No primary bone lesion
or focal pathologic process.

Soft tissues and spinal canal: No prevertebral fluid or swelling. No
visible canal hematoma.

Disc levels: Anterior and posterior cervical discectomy infusion
with laminectomy from C4 through C7.

Upper chest: Negative.

Other: None.
IMPRESSION: 1. No evidence of acute intracranial abnormality. Small-vessel white
matter disease.
2. No evidence of acute cervical spine fracture or subluxation.
Cervical discectomy and fusion from C4 through C7.

## 2019-10-06 MED ORDER — DEXTROSE 50 % IV SOLN
50.0000 mL | Freq: Once | INTRAVENOUS | Status: AC
  Administered 2019-10-06: 13:00:00 50 mL via INTRAVENOUS

## 2019-10-06 NOTE — Progress Notes (Signed)
PROGRESS NOTE  Russell AnonMichael Carter ZOX:[id number] DOB: July 08, 1956 DOA: 10-22-19  PCP: [name], [name], MD  Brief History/Interval Summary: 63 y.o. male with medical history significant of CAD, osteoarthritis, polysubstance abuse, chronic anticoagulation, diabetes, cocaine abuse, hyperlipidemia, cervical stenosis, anxiety disorder, hypertension, chronic kidney disease, seizure disorder, history of CVA, and bipolar disorder who was recently admitted with COVID pneumonia, who returned with worsening symptoms.  He was discharged on 12/8.  He was no longer hypoxic.  His inflammatory markers have become normal.  Due to hypoxia patient was hospitalized for further management.  Reason for Visit: Pneumonia due to COVID-19.  Acute respiratory failure with hypoxia  Consultants: None  Procedures: None  Antibiotics: Anti-infectives (From admission, onward)   Start     Dose/Rate Route Frequency Ordered Stop   10/04/19 1000  remdesivir 100 mg in sodium chloride 0.9 % 100 mL IVPB     100 mg 200 mL/hr over 30 Minutes Intravenous Daily 10/03/19 1148 10/06/19 0915   10/03/19 1400  remdesivir 200 mg in sodium chloride 0.9% 250 mL IVPB     200 mg 580 mL/hr over 30 Minutes Intravenous Once 10/03/19 1148 10/03/19 1415      Subjective/Interval History: Patient remains distracted this morning.  Slow in answering questions.  States that he is feeling well.   Assessment/Plan:  Acute Hypoxic Resp. Failure/Pneumonia due to COVID-19  COVID-19 Labs  Lab Results  Component Value Date   SARSCOV2NAA POSITIVE (A) 09/25/2019   SARSCOV2NAA NEGATIVE 08/19/2019   SARSCOV2NAA NEGATIVE 05/11/2019    Recent Labs  Lab 12/21/2018 0915 10/03/19 0120 10/04/19 0500 10/05/19 0419 10/06/19 0445  DDIMER 0.37 0.37 0.32 0.33 0.30  FERRITIN  --  144 235 198 161  CRP  --  14.8* 8.8* 4.1* 1.7*  ALT 17 16 16 14 13     Objective findings: Fever: Remains afebrile Oxygen requirements: Nasal cannula.  Noted to be on 5  L.  Saturations were noted to be 98% when probe was applied.  Oxygen was turned down to 3 L.   COVID 19 Therapeutics: Antibacterials: None Remdesivir: Second course.  Day 4 today Steroids: Dexamethasone 6 mg every 12 hours Diuretics: Not on scheduled diuretics Actemra: Not given Convalescent Plasma: Not given Vitamin C and Zinc: Continue PUD Prophylaxis: famotidine DVT Prophylaxis: On Eliquis  From a respiratory standpoint patient remains stable.  His oxygen could be weaned down further.  Inflammatory markers have improved.  CRP is down to 1.7.  Ferritin is normal.  D-dimer is normal.  Continue with incentive spirometry, mobilization, prone positioning as tolerated.  He is on second course of remdesivir and also is on steroids.  Acute metabolic encephalopathy Patient noted to be confused and mildly delirious.  He does have significant history of opioid abuse as well as drug abuse including alcohol.  His mother is not sure when was the last time he took alcohol.  He is not tachycardic or hypertensive.  But still could have alcohol withdrawal.  Does not have any focal neurological deficits.  CT scan of the head did not show any acute findings.  Could also be medication induced.  His Reglan was held.  Gabapentin was held.  Dose of Vimpat was reduced due to renal failure.  Ammonia level is in the 40s.  Likely this is hepatic encephalopathy.  Depakote level was subtherapeutic.  Continue to monitor for now.  Continue to hold his narcotics as well.  Acute renal failure No known history of kidney disease.  Patient's BUN and creatinine were  noted to be significantly elevated on 12/13.  Patient was aggressively hydrated.  Renal function has improved.  BUN and creatinine improved to 65 and 1.89 respectively.  Monitor urine output.  Continue IV fluids for now.  Right-sided upper abdominal discomfort He was experiencing some pain in his right upper quadrant a few days ago.  LFTs remain normal.  No  tenderness appreciated today.  Looks like he has had this symptom on and off for a while.  He underwent biliary evaluation last year including ultrasound as well as HIDA scan both of which were unremarkable.  Continue with Pepcid for now.    History of coronary artery disease Stable.  Denies any chest pain.  Continue aspirin and statin.  History of CVA Continue statin.  Continue anticoagulation with Eliquis.  Diabetes mellitus type 2, uncontrolled with hyperglycemia HbA1c 6.3 in October.  Elevated CBGs due to steroids.  Continue SSI and Lantus.  Monitor CBGs.  Not noted to be on any glucose lowering agents at home.  History of gout Continue allopurinol.  History of bipolar disorder Duloxetine has been placed on hold due to altered mental status.    History of seizure disorder Followed by neurology.  Patient is on Depakote and Vimpat.  Continue Depakote.  Vimpat dose was decreased due to renal failure.  If creatinine continues to improve this can be increased back to his usual dose tomorrow.    Essential hypertension ARB discontinued due to AKI.  Stopped amlodipine due to borderline low blood pressures.  Pressures stable.     History of BPH Continue Flomax.  Chronic opioid use Noted to be on scheduled Percocet.  Previous records reviewed and he has been on 3 times a day Percocet previously as well.  Holding scheduled Percocet for now due to altered mental status.  Previous history of alcohol abuse Patient mentioned that he has not had any alcohol in several years however it looks like he was drinking till above a few months ago.  Continue thiamine.   DVT Prophylaxis: On Eliquis Code Status: Full code Family Communication: His mother being updated on daily basis. Disposition Plan: Management as outlined above.  Mobilize as tolerated.     Medications:  Scheduled: . allopurinol  300 mg Oral Daily  . apixaban  5 mg Oral BID  . aspirin EC  81 mg Oral Daily  . calcium-vitamin D   1 tablet Oral Daily  . Chlorhexidine Gluconate Cloth  6 each Topical Daily  . dexamethasone (DECADRON) injection  6 mg Intravenous Q12H  . divalproex  1,000 mg Oral QHS  . divalproex  500 mg Oral q morning - 10a  . famotidine  20 mg Oral Daily  . folic acid  1 mg Oral Daily  . insulin aspart  0-5 Units Subcutaneous QHS  . insulin aspart  0-9 Units Subcutaneous TID WC  . insulin glargine  8 Units Subcutaneous Daily  . Ipratropium-Albuterol  1 puff Inhalation Q6H  . lacosamide  50 mg Oral BID  . Magnesium Oxide  200 mg Oral QHS  . multivitamin with minerals  1 tablet Oral Daily  . oxyCODONE-acetaminophen  1 tablet Oral TID   And  . oxyCODONE  2.5 mg Oral TID  . sodium chloride flush  10-40 mL Intracatheter Q12H  . tamsulosin  0.4 mg Oral QHS  . thiamine injection  100 mg Intravenous Daily  . vitamin C  500 mg Oral Daily   Continuous: . sodium chloride 100 mL/hr at 10/06/19 1109  ONG:[id number], nitroGLYCERIN, ondansetron **OR** ondansetron (ZOFRAN) IV, senna-docusate, sodium chloride flush   Objective:  Vital Signs  Vitals:   10/05/19 2057 10/05/19 2102 10/06/19 0341 10/06/19 0731  BP:   134/79 114/66  Pulse:   68 74  Resp: 14  18 20   Temp:   98.2 F (36.8 C) 98.5 F (36.9 C)  TempSrc:   Oral Oral  SpO2: 100% 97% 94% 95%  Weight:      Height:        Intake/Output Summary (Last 24 hours) at 10/06/2019 1115 Last data filed at 10/06/2019 1011 Gross per 24 hour  Intake 1292.83 ml  Output 650 ml  Net 642.83 ml   Filed Weights   10/03/19 1642  Weight: 95.3 kg    General appearance: Somnolent.  Easily arousable.  Noted to be distracted. Resp: Normal effort at rest.  Coarse breath sound bilaterally.  Few crackles at the bases.  No wheezing or rhonchi. Cardio: S1-S2 is normal regular.  No S3-S4.  No rubs murmurs or bruit GI: Abdomen is soft.  Nontender nondistended.  Bowel sounds are present normal.  No masses organomegaly Extremities: No edema.  Full range of  motion of lower extremities. Neurologic: Somnolent but easily arousable.  Tongue is midline.  No facial asymmetry.  Motor strength equal bilateral upper and lower extremities.  Mildly tremulous.     Lab Results:  Data Reviewed: I have personally reviewed following labs and imaging studies  CBC: Recent Labs  Lab 09/28/2019 0915 10/03/19 0120 10/04/19 0500 10/05/19 0419 10/06/19 0445  WBC 6.3 6.7 15.0* 18.3* 13.7*  NEUTROABS 5.2 6.1 13.1* 16.6* 12.5*  HGB 16.2 15.6 14.5 13.8 13.7  HCT 51.4 49.0 46.3 43.8 42.7  MCV 88.8 87.2 88.7 88.1 87.7  PLT 178 190 227 221 213    Basic Metabolic Panel: Recent Labs  Lab 10/12/2019 0915 10/03/19 0120 10/04/19 0500 10/05/19 0419 10/06/19 0445  NA 137 139 136 135 135  K 3.9 4.2 5.0 4.7 4.9  CL 98 104 98 98 102  CO2 24 22 23 23 23   GLUCOSE 94 156* 151* 132* 107*  BUN 13 16 56* 72* 65*  CREATININE 1.18 1.12 3.34* 3.29* 1.89*  CALCIUM 9.1 8.8* 9.0 8.6* 8.6*  MG  --  2.2 2.8* 2.7* 2.6*  PHOS  --  3.1  --   --   --     GFR: Estimated Creatinine Clearance: 46.5 mL/min (A) (by C-G formula based on SCr of 1.89 mg/dL (H)).  Liver Function Tests: Recent Labs  Lab 10/16/2019 0915 10/03/19 0120 10/04/19 0500 10/05/19 0419 10/06/19 0445  AST 22 21 21 19 16   ALT 17 16 16 14 13   ALKPHOS 44 40 38 40 38  BILITOT 0.8 0.7 0.3 0.8 0.5  PROT 7.8 7.5 7.0 6.5 6.4*  ALBUMIN 3.5 3.1* 2.9* 2.9* 2.7*    CBG: Recent Labs  Lab 10/05/19 0812 10/05/19 1104 10/05/19 1553 10/05/19 2022 10/06/19 0730  GLUCAP 112* 235* 140* 253* 292*     Anemia Panel: Recent Labs    10/05/19 0419 10/06/19 0445  FERRITIN 198 161    Recent Results (from the past 240 hour(s))  MRSA PCR Screening     Status: None   Collection Time: 10/01/2019  6:12 PM   Specimen: Nasopharyngeal  Result Value Ref Range Status   MRSA by PCR NEGATIVE NEGATIVE Final    Comment:        The GeneXpert MRSA Assay (FDA approved for NASAL specimens only), is one  component of a  comprehensive MRSA colonization surveillance program. It is not intended to diagnose MRSA infection nor to guide or monitor treatment for MRSA infections. Performed at [facility], [place] [address] [place], [place] [address]       Radiology Studies: [place] EKG SITE RITE  Result Date: 10/05/2019 If Site Rite image not attached, placement could not be confirmed due to current cardiac rhythm.      LOS: 4 days   Josedaniel Haye [employer] on [email or web address]  10/06/2019, 11:15 AM

## 2019-10-06 NOTE — TOC Progression Note (Signed)
Transition of Care [facility]) - Progression Note    Patient Details  Name: Jhoan Schmieder MRN: [id number] Date of Birth: 1956-02-25  Transition of Care [facility]) CM/SW Contact  Joaquin Courts, RN Phone Number: 10/06/2019, 10:28 AM  Clinical Narrative:    CM made second round of calls for [facility] services. Was able to give referral to [facility] for HHPT, rep Tommi Rumps did mention that agency is able to service patient for HHPT.      Expected Discharge Plan: [place] Barriers to Discharge: Continued Medical Work up  Expected Discharge Plan and Services Expected Discharge Plan: [place] Choice: [place] arrangements for the past 2 months: Single Family Home Expected Discharge Date: (unknown)                         HH Arranged: PT HH Agency: [place] Date [place]: 10/06/19 Time [place]: 1027 Representative spoke with at [place]: [place]) Interventions    Readmission Risk Interventions Readmission Risk Prevention Plan 09/29/2019  Transportation Screening Complete  Medication Review Press photographer) Complete  PCP or Specialist appointment within 3-5 days of discharge Not Complete  PCP/Specialist Appt Not Complete comments called and was on hold for over 15 minutes  HRI or [place] Complete  SW Recovery Care/Counseling Consult Complete  [place] Not Applicable  Some recent data might be hidden

## 2019-10-07 ENCOUNTER — Inpatient Hospital Stay (HOSPITAL_COMMUNITY): Payer: Medicare HMO

## 2019-10-07 DIAGNOSIS — F1910 Other psychoactive substance abuse, uncomplicated: Secondary | ICD-10-CM | POA: Diagnosis not present

## 2019-10-07 DIAGNOSIS — J9601 Acute respiratory failure with hypoxia: Secondary | ICD-10-CM | POA: Diagnosis not present

## 2019-10-07 DIAGNOSIS — E440 Moderate protein-calorie malnutrition: Secondary | ICD-10-CM | POA: Diagnosis not present

## 2019-10-07 DIAGNOSIS — J1289 Other viral pneumonia: Secondary | ICD-10-CM | POA: Diagnosis not present

## 2019-10-07 DIAGNOSIS — J690 Pneumonitis due to inhalation of food and vomit: Secondary | ICD-10-CM | POA: Diagnosis not present

## 2019-10-07 DIAGNOSIS — R404 Transient alteration of awareness: Secondary | ICD-10-CM | POA: Diagnosis not present

## 2019-10-07 DIAGNOSIS — R918 Other nonspecific abnormal finding of lung field: Secondary | ICD-10-CM | POA: Diagnosis not present

## 2019-10-07 DIAGNOSIS — I469 Cardiac arrest, cause unspecified: Secondary | ICD-10-CM | POA: Diagnosis not present

## 2019-10-07 DIAGNOSIS — J1282 Pneumonia due to Coronavirus disease 2019: Secondary | ICD-10-CM | POA: Diagnosis not present

## 2019-10-07 DIAGNOSIS — I69354 Hemiplegia and hemiparesis following cerebral infarction affecting left non-dominant side: Secondary | ICD-10-CM | POA: Diagnosis not present

## 2019-10-07 DIAGNOSIS — N3000 Acute cystitis without hematuria: Secondary | ICD-10-CM | POA: Diagnosis not present

## 2019-10-07 DIAGNOSIS — R4182 Altered mental status, unspecified: Secondary | ICD-10-CM | POA: Diagnosis not present

## 2019-10-07 DIAGNOSIS — U071 COVID-19: Secondary | ICD-10-CM | POA: Diagnosis not present

## 2019-10-07 DIAGNOSIS — Z4682 Encounter for fitting and adjustment of non-vascular catheter: Secondary | ICD-10-CM | POA: Diagnosis not present

## 2019-10-07 DIAGNOSIS — N401 Enlarged prostate with lower urinary tract symptoms: Secondary | ICD-10-CM | POA: Diagnosis not present

## 2019-10-07 DIAGNOSIS — S0990XA Unspecified injury of head, initial encounter: Secondary | ICD-10-CM | POA: Diagnosis not present

## 2019-10-07 DIAGNOSIS — N179 Acute kidney failure, unspecified: Secondary | ICD-10-CM | POA: Diagnosis not present

## 2019-10-07 DIAGNOSIS — R519 Headache, unspecified: Secondary | ICD-10-CM | POA: Diagnosis not present

## 2019-10-07 DIAGNOSIS — R3916 Straining to void: Secondary | ICD-10-CM | POA: Diagnosis not present

## 2019-10-07 DIAGNOSIS — Z7289 Other problems related to lifestyle: Secondary | ICD-10-CM | POA: Diagnosis not present

## 2019-10-07 DIAGNOSIS — G92 Toxic encephalopathy: Secondary | ICD-10-CM | POA: Diagnosis not present

## 2019-10-07 DIAGNOSIS — E669 Obesity, unspecified: Secondary | ICD-10-CM | POA: Diagnosis not present

## 2019-10-07 DIAGNOSIS — K7200 Acute and subacute hepatic failure without coma: Secondary | ICD-10-CM

## 2019-10-07 DIAGNOSIS — J181 Lobar pneumonia, unspecified organism: Secondary | ICD-10-CM | POA: Diagnosis not present

## 2019-10-07 DIAGNOSIS — I10 Essential (primary) hypertension: Secondary | ICD-10-CM | POA: Diagnosis not present

## 2019-10-07 DIAGNOSIS — A419 Sepsis, unspecified organism: Secondary | ICD-10-CM | POA: Diagnosis not present

## 2019-10-07 DIAGNOSIS — N390 Urinary tract infection, site not specified: Secondary | ICD-10-CM | POA: Diagnosis not present

## 2019-10-07 DIAGNOSIS — R69 Illness, unspecified: Secondary | ICD-10-CM | POA: Diagnosis not present

## 2019-10-07 DIAGNOSIS — R6521 Severe sepsis with septic shock: Secondary | ICD-10-CM | POA: Diagnosis not present

## 2019-10-07 DIAGNOSIS — G9341 Metabolic encephalopathy: Secondary | ICD-10-CM | POA: Diagnosis not present

## 2019-10-07 DIAGNOSIS — E870 Hyperosmolality and hypernatremia: Secondary | ICD-10-CM | POA: Diagnosis not present

## 2019-10-07 LAB — COMPREHENSIVE METABOLIC PANEL
ALT: 13 U/L (ref 0–44)
AST: 17 U/L (ref 15–41)
Albumin: 2.6 g/dL — ABNORMAL LOW (ref 3.5–5.0)
Alkaline Phosphatase: 39 U/L (ref 38–126)
Anion gap: 9 (ref 5–15)
BUN: 47 mg/dL — ABNORMAL HIGH (ref 8–23)
CO2: 24 mmol/L (ref 22–32)
Calcium: 8.5 mg/dL — ABNORMAL LOW (ref 8.9–10.3)
Chloride: 108 mmol/L (ref 98–111)
Creatinine, Ser: 1.37 mg/dL — ABNORMAL HIGH (ref 0.61–1.24)
GFR calc Af Amer: >60 mL/min (ref >60)
GFR calc non Af Amer: 54 mL/min — ABNORMAL LOW (ref >60)
Glucose, Bld: 87 mg/dL (ref 70–99)
Potassium: 5.4 mmol/L — ABNORMAL HIGH (ref 3.5–5.1)
Sodium: 141 mmol/L (ref 135–145)
Total Bilirubin: 0.5 mg/dL (ref 0.3–1.2)
Total Protein: 6.3 g/dL — ABNORMAL LOW (ref 6.5–8.1)

## 2019-10-07 LAB — CBC WITH DIFFERENTIAL/PLATELET
Abs Immature Granulocytes: 0.08 10*3/uL — ABNORMAL HIGH (ref 0.00–0.07)
Basophils Absolute: 0 10*3/uL (ref 0.0–0.1)
Basophils Relative: 0 %
Eosinophils Absolute: 0 10*3/uL (ref 0.0–0.5)
Eosinophils Relative: 0 %
HCT: 42.5 % (ref 39.0–52.0)
Hemoglobin: 13.6 g/dL (ref 13.0–17.0)
Immature Granulocytes: 1 %
Lymphocytes Relative: 3 %
Lymphs Abs: 0.4 10*3/uL — ABNORMAL LOW (ref 0.7–4.0)
MCH: 27.9 pg (ref 26.0–34.0)
MCHC: 32 g/dL (ref 30.0–36.0)
MCV: 87.1 fL (ref 80.0–100.0)
Monocytes Absolute: 0.6 10*3/uL (ref 0.1–1.0)
Monocytes Relative: 4 %
Neutro Abs: 14.3 10*3/uL — ABNORMAL HIGH (ref 1.7–7.7)
Neutrophils Relative %: 92 %
Platelets: 203 10*3/uL (ref 150–400)
RBC: 4.88 MIL/uL (ref 4.22–5.81)
RDW: 14.8 % (ref 11.5–15.5)
WBC: 15.4 10*3/uL — ABNORMAL HIGH (ref 4.0–10.5)
nRBC: 0 % (ref 0.0–0.2)

## 2019-10-07 LAB — GLUCOSE, CAPILLARY
Glucose-Capillary: 47 mg/dL — ABNORMAL LOW (ref 70–99)
Glucose-Capillary: 97 mg/dL (ref 70–99)
Glucose-Capillary: 106 mg/dL — ABNORMAL HIGH (ref 70–99)
Glucose-Capillary: 126 mg/dL — ABNORMAL HIGH (ref 70–99)

## 2019-10-07 LAB — D-DIMER, QUANTITATIVE: D-Dimer, Quant: 0.5 ug/mL-FEU (ref 0.00–0.50)

## 2019-10-07 LAB — FERRITIN: Ferritin: 155 ng/mL (ref 24–336)

## 2019-10-07 LAB — AMMONIA: Ammonia: 56 umol/L — ABNORMAL HIGH (ref 9–35)

## 2019-10-07 LAB — C-REACTIVE PROTEIN: CRP: 13.8 mg/dL — ABNORMAL HIGH (ref <1.0)

## 2019-10-07 LAB — MAGNESIUM: Magnesium: 2.8 mg/dL — ABNORMAL HIGH (ref 1.7–2.4)

## 2019-10-07 MED ORDER — SODIUM CHLORIDE 0.9 % IV SOLN
100.0000 mg | Freq: Once | INTRAVENOUS | Status: AC
  Administered 2019-10-07: 100 mg via INTRAVENOUS
  Filled 2019-10-07: qty 20

## 2019-10-07 MED ORDER — THIAMINE HCL 100 MG PO TABS
100.0000 mg | ORAL_TABLET | Freq: Every day | ORAL | Status: DC
  Administered 2019-10-08 – 2019-10-18 (×11): 100 mg via ORAL
  Filled 2019-10-07 (×11): qty 1

## 2019-10-07 MED ORDER — LACTULOSE 10 GM/15ML PO SOLN
10.0000 g | Freq: Three times a day (TID) | ORAL | Status: DC
  Administered 2019-10-07 – 2019-10-11 (×10): 10 g via ORAL
  Filled 2019-10-07 (×10): qty 15

## 2019-10-07 NOTE — Progress Notes (Signed)
Physical Therapy Treatment Patient Details Name: Russell Carter MRN: [id number] DOB: 09-07-1956 Today's Date: 10/07/2019    History of Present Illness Pt is a 63 y.o. male recently admitted to [facility] four days with d/c home 09/29/19, now readmitted 2019/10/30 with worsening SOB and headache. PMH includes seizures, CAD, stroke, heart block, cervical stenosis s/p neck sx with subsequent L-side pain/weakness, CKD, DM2, substance abuse, chronic opiate use.   PT Comments    Pt with increased lethargy this session, minimal response to questions, requiring max, multimodal cues to initiate OOB mobility; not consistently following commands, only oriented to self. RN notified and reports pt with increased ammonia levels. Pt currently requiring modA to initiate mobility. SpO2 82-90% on 3L O2; post-transfer BP 149/83. Will continue to follow acutely.   Follow Up Recommendations  Home health PT;Supervision for mobility/OOB (pending progression/cognitive status)     Equipment Recommendations  None recommended by PT    Recommendations for Other Services       Precautions / Restrictions Precautions Precautions: Fall Precaution Comments: 12/16- increased AMS (RN reports elevated ammonia) Restrictions Weight Bearing Restrictions: No    Mobility  Bed Mobility Overal bed mobility: Needs Assistance Bed Mobility: Supine to Sit     Supine to sit: Mod assist     General bed mobility comments: ModA to initiate movement; pt indep to come to long sit then attempting to remove gown/lines, requiring max, multimodal cues to redirect; increased time attempting to redirect to task  Transfers Overall transfer level: Needs assistance Equipment used: Rolling walker (2 wheeled) Transfers: Sit to/from Stand Sit to Stand: Mod assist         General transfer comment: ModA to initiate movement, standing with minA to maintain balance, max cues to reach second hand to RW for  support  Ambulation/Gait Ambulation/Gait assistance: Mod assist Gait Distance (Feet): 2 Feet Assistive device: Rolling walker (2 wheeled) Gait Pattern/deviations: Step-to pattern;Leaning posteriorly;Trunk flexed Gait velocity: Decreased   General Gait Details: Max, multimodal cues to take steps to chair; modA to iniate movement, but minA to actually maintain balance   Stairs             Wheelchair Mobility    Modified Rankin (Stroke Patients Only)       Balance Overall balance assessment: Needs assistance   Sitting balance-Leahy Scale: Fair       Standing balance-Leahy Scale: Poor                              Cognition Arousal/Alertness: Lethargic Behavior During Therapy: Flat affect Overall Cognitive Status: Impaired/Different from baseline Area of Impairment: Orientation;Attention;Memory;Following commands;Safety/judgement;Awareness;Problem solving                 Orientation Level: Disoriented to;Time;Place;Situation Current Attention Level: Focused Memory: Decreased short-term memory Following Commands: Follows one step commands inconsistently Safety/Judgement: Decreased awareness of deficits;Decreased awareness of safety Awareness: Intellectual Problem Solving: Slow processing;Decreased initiation;Difficulty sequencing;Requires verbal cues;Requires tactile cues General Comments: Significant change in cognition compared to PT eval (RN reports elevated ammonia levels). Pt with eyes closed majority of session. Able to state name, could not state fiancee's name until asked again at end of session. "I'm [place] [address]... I need to get to [address]." Max, multimodal cues for movement      Exercises      General Comments General comments (skin integrity, edema, etc.): SpO2 82-90% on 3L O2, HR 72, post-mobility BP 149/83  Pertinent Vitals/Pain Pain Assessment: Faces Faces Pain Scale: Hurts a little bit Pain Location:  Generalized weakness Pain Descriptors / Indicators: Discomfort Pain Intervention(s): Monitored during session    Home Living                      Prior Function            PT Goals (current goals can now be found in the care plan section) Progress towards PT goals: Not progressing toward goals - comment(lethargy, cognitive change)    Frequency    Min 3X/week      PT Plan Current plan remains appropriate    Co-evaluation              AM-PAC PT "6 Clicks" Mobility   Outcome Measure  Help needed turning from your back to your side while in a flat bed without using bedrails?: A Little Help needed moving from lying on your back to sitting on the side of a flat bed without using bedrails?: A Little Help needed moving to and from a bed to a chair (including a wheelchair)?: A Little Help needed standing up from a chair using your arms (e.g., wheelchair or bedside chair)?: A Little Help needed to walk in hospital room?: A Lot Help needed climbing 3-5 steps with a railing? : A Lot 6 Click Score: 16    End of Session Equipment Utilized During Treatment: Oxygen Activity Tolerance: Patient limited by lethargy Patient left: in chair;with call bell/phone within reach;with chair alarm set(no chair alarm cord, RN aware) Nurse Communication: Mobility status;Other (comment)(change in cognition compared to previous session) PT Visit Diagnosis: Difficulty in walking, not elsewhere classified (R26.2);Other abnormalities of gait and mobility (R26.89)     Time: 1049-1110 PT Time Calculation (min) (ACUTE ONLY): 21 min  Charges:  $Therapeutic Activity: 8-22 mins                    Mabeline Caras, PT, DPT Acute Rehabilitation Services  Pager [phone] Office [phone]  Derry Lory 10/07/2019, 12:44 PM

## 2019-10-07 NOTE — Plan of Care (Signed)
Pt slept well during the night. No complaints of pain verbalized. Lethargic but easily woken up, oriented to person place and situation. Vitals stable, O2 weaned to 2L [place]. Minimal dyspnea on exertion and dry cough noted. Minimal assist with ADLs. Condom catheter draining well. PICC line infusing IV maintenance fluids, blood draw done. Independently  able to reposition for pressure relief. No other issues, will monitor.   Problem: Coping: Goal: Psychosocial and spiritual needs will be supported Outcome: Progressing   Problem: Activity: Goal: Risk for activity intolerance will decrease Outcome: Progressing     Problem: Respiratory: Goal: Will maintain a patent airway Outcome: Progressing Goal: Complications related to the disease process, condition or treatment will be avoided or minimized Outcome: Progressing

## 2019-10-07 NOTE — Progress Notes (Addendum)
Russell Carter  UUE:[id number] DOB: 1956/05/29 DOA: Nov 01, 2019 PCP: Marcine Matar, MD    Brief Narrative:  63 y.o.malewith medical history significant ofCAD, osteoarthritis, polysubstance abuse, chronic anticoagulation, diabetes, cocaine abuse, hyperlipidemia, cervical stenosis, anxiety disorder,hypertension, chronic kidney disease, seizure disorder, history of CVA, andbipolar disorderwho was recently admitted with COVID pneumonia, who returned with worsening symptoms.  He was discharged on 12/8.  He was no longer hypoxic.  His inflammatory markers have become normal.  Due to hypoxia patient was hospitalized for further management.  Significant Events:   COVID-19 specific Treatment:   Subjective: The patient remains altered.  He suffered an unwitnessed fall this morning in which he was found sitting on his buttocks on the floor with his head resting against the bed.  He was in a bedside recliner prior to this event.  There is no obvious traumatic injury to the scalp or face or neck.  The patient was alert and would follow simple commands but would not cooperate fully with the physical exam.  There was no evidence of focal neurologic deficit.  Assessment & Plan:  COVID Pneumonia -acute hypoxic respiratory failure Continue remdesivir (this is now his second 5-day course) and Decadron -oxygen saturations stabilizing with patient now only requiring 2 L conventional nasal cannula  Recent Labs  Lab 10/03/19 0120 10/04/19 0500 10/05/19 0419 10/06/19 0445 10/07/19 0130  DDIMER 0.37 0.32 0.33 0.30 0.50  FERRITIN 144 235 198 161 155  CRP 14.8* 8.8* 4.1* 1.7* 13.8*  ALT 16 16 14 13 13    Acute metabolic encephalopathy Etiology not clear at this time -has a complex history of polysubstance abuse -no physiologic findings to suggest acute withdrawal -hepatic encephalopathy remains at the top of the differential -continue to treat this and follow -uremia is likely also  contributing  Fall without apparent injury Patient suffered an unwitnessed fall in his room 12/16 -physical exam post fall did not reveal any clear evidence of injury/trauma -CT scan head post fall was without acute findings -neurologic exam was impaired due to patient's delirium but otherwise nonfocal  Acute renal failure No prior history of kidney disease - improving with volume expansion  Right-sided upper abdominal discomfort He was experiencing some pain in his right upper quadrant a few days ago.  LFTs remain normal.  No tenderness appreciated today.  Looks like he has had this symptom on and off for a while.  He underwent biliary evaluation last year including ultrasound as well as HIDA scan both of which were unremarkable.  Continue with Pepcid for now.   History of CAD Continue aspirin and statin  History of CVA Continue usual home medications  DM 2 -uncontrolled with hyperglycemia CBGs now well controlled -adjust medication to avoid hypoglycemia if this recurs  Gout Continue allopurinol  Bipolar disorder  Seizure disorder Continue Depakote and Vimpat -note Vimpat dose was adjusted due to renal failure and may need to be resumed at a higher dose if creatinine continues to trend down  HTN  BPH Continue Flomax  Chronic opioid use Med record shows patient on scheduled Percocet -holding for now due to altered mental status  Prior history of alcohol abuse Unclear if patient is still drinking at this time  DVT prophylaxis: Eliquis Code Status: FULL CODE Family Communication:  Disposition Plan:   Consultants:  none  Antimicrobials:  None  Objective: Blood pressure (!) 144/79, pulse 95, temperature 98.9 F (37.2 C), temperature source Oral, resp. rate (!) 22, height 5\' 11"  (1.803 m), weight 95.3 kg,  SpO2 91 %.  Intake/Output Summary (Last 24 hours) at 10/07/2019 1255 Last data filed at 10/07/2019 0333 Gross per 24 hour  Intake 1730.37 ml  Output 1700 ml   Net 30.37 ml   Filed Weights   10/03/19 1642  Weight: 95.3 kg    Examination: General: No acute respiratory distress Lungs: Clear to auscultation bilaterally without wheezes or crackles Cardiovascular: Regular rate and rhythm without murmur gallop or rub normal S1 and S2 Abdomen: Nontender, nondistended, soft, bowel sounds positive, no rebound, no ascites, no appreciable mass Extremities: No significant cyanosis, clubbing, or edema bilateral lower extremities  CBC: Recent Labs  Lab 10/05/19 0419 10/06/19 0445 10/07/19 0130  WBC 18.3* 13.7* 15.4*  NEUTROABS 16.6* 12.5* 14.3*  HGB 13.8 13.7 13.6  HCT 43.8 42.7 42.5  MCV 88.1 87.7 87.1  PLT 221 213 673   Basic Metabolic Panel: Recent Labs  Lab 10/03/19 0120 10/05/19 0419 10/06/19 0445 10/07/19 0130  NA 139 135 135 141  K 4.2 4.7 4.9 5.4*  CL 104 98 102 108  CO2 22 23 23 24   GLUCOSE 156* 132* 107* 87  BUN 16 72* 65* 47*  CREATININE 1.12 3.29* 1.89* 1.37*  CALCIUM 8.8* 8.6* 8.6* 8.5*  MG 2.2 2.7* 2.6* 2.8*  PHOS 3.1  --   --   --    GFR: Estimated Creatinine Clearance: 64.2 mL/min (A) (by C-G formula based on SCr of 1.37 mg/dL (H)).  Liver Function Tests: Recent Labs  Lab 10/04/19 0500 10/05/19 0419 10/06/19 0445 10/07/19 0130  AST 21 19 16 17   ALT 16 14 13 13   ALKPHOS 38 40 38 39  BILITOT 0.3 0.8 0.5 0.5  PROT 7.0 6.5 6.4* 6.3*  ALBUMIN 2.9* 2.9* 2.7* 2.6*    Recent Labs  Lab 10/06/19 0444 10/07/19 0130  AMMONIA 45* 56*    HbA1C: HbA1c, POC (controlled diabetic range)  Date/Time Value Ref Range Status  06/06/2018 03:03 PM 5.7 0.0 - 7.0 % Final   Hgb A1c MFr Bld  Date/Time Value Ref Range Status  08/19/2019 05:41 PM 6.3 (H) 4.8 - 5.6 % Final    Comment:    (NOTE) Pre diabetes:          5.7%-6.4% Diabetes:              >6.4% Glycemic control for   <7.0% adults with diabetes   12/25/2018 08:01 AM 6.3 (H) 4.8 - 5.6 % Final    Comment:    (NOTE) Pre diabetes:           5.7%-6.4% Diabetes:              >6.4% Glycemic control for   <7.0% adults with diabetes     CBG: Recent Labs  Lab 10/06/19 1328 10/06/19 1656 10/06/19 2102 10/07/19 0836 10/07/19 1220  GLUCAP 139* 107* 93 97 106*    Recent Results (from the past 240 hour(s))  MRSA PCR Screening     Status: None   Collection Time: 10/20/2019  6:12 PM   Specimen: Nasopharyngeal  Result Value Ref Range Status   MRSA by PCR NEGATIVE NEGATIVE Final    Comment:        The GeneXpert MRSA Assay (FDA approved for NASAL specimens only), is one component of a comprehensive MRSA colonization surveillance program. It is not intended to diagnose MRSA infection nor to guide or monitor treatment for MRSA infections. Performed at [facility], [place] [address], [place], [place] [address]  Scheduled Meds: . allopurinol  300 mg Oral Daily  . apixaban  5 mg Oral BID  . aspirin EC  81 mg Oral Daily  . calcium-vitamin D  1 tablet Oral Daily  . Chlorhexidine Gluconate Cloth  6 each Topical Daily  . dexamethasone (DECADRON) injection  6 mg Intravenous Q12H  . divalproex  1,000 mg Oral QHS  . divalproex  500 mg Oral q morning - 10a  . famotidine  20 mg Oral Daily  . folic acid  1 mg Oral Daily  . insulin aspart  0-5 Units Subcutaneous QHS  . insulin aspart  0-9 Units Subcutaneous TID WC  . insulin glargine  8 Units Subcutaneous Daily  . Ipratropium-Albuterol  1 puff Inhalation Q6H  . lacosamide  50 mg Oral BID  . Magnesium Oxide  200 mg Oral QHS  . multivitamin with minerals  1 tablet Oral Daily  . sodium chloride flush  10-40 mL Intracatheter Q12H  . tamsulosin  0.4 mg Oral QHS  . [START ON 10/08/2019] thiamine  100 mg Oral Daily  . vitamin C  500 mg Oral Daily     LOS: 5 days   Lonia Blood, MD [facility] Office  [phone] Pager - Text Page per Amion  If 7PM-7AM, please contact night-coverage per Amion 10/07/2019, 12:55 PM

## 2019-10-07 NOTE — Progress Notes (Addendum)
Around 1225, Pt was sitting alseep in his chair and I was present in his room. Before I left I told him to call if he needed anything or if he wanted to get back in bed and he agreed. Around 1235 nursing staff heard his chair alarm going off and went into the room. RNs and NTs found the patient sitting on the floor with his head against the side of the bed. When asked he said he "hit his head" but he could not tell me which part of his head that he hit. MD notified and CT ordered and completed. Pt's son was called twice but was unsuccessful with getting in contact with him and his phone did not allow me to leave a message. His fiance was called and updated on the incident. Padded floor mats are now down on the floor and the patient is resting in bed with an active bed alarm. VSS, no s/s of distress at this time, will cont to monitor.   UPDATE-1700  Spoke to son, Taz Vanness., he has be updated about pt fall.

## 2019-10-08 LAB — CBC
HCT: 45.2 % (ref 39.0–52.0)
Hemoglobin: 14.8 g/dL (ref 13.0–17.0)
MCH: 28.6 pg (ref 26.0–34.0)
MCHC: 32.7 g/dL (ref 30.0–36.0)
MCV: 87.3 fL (ref 80.0–100.0)
Platelets: 222 10*3/uL (ref 150–400)
RBC: 5.18 MIL/uL (ref 4.22–5.81)
RDW: 15 % (ref 11.5–15.5)
WBC: 14.8 10*3/uL — ABNORMAL HIGH (ref 4.0–10.5)
nRBC: 0 % (ref 0.0–0.2)

## 2019-10-08 LAB — GLUCOSE, CAPILLARY
Glucose-Capillary: 137 mg/dL — ABNORMAL HIGH (ref 70–99)
Glucose-Capillary: 135 mg/dL — ABNORMAL HIGH (ref 70–99)
Glucose-Capillary: 119 mg/dL — ABNORMAL HIGH (ref 70–99)
Glucose-Capillary: 127 mg/dL — ABNORMAL HIGH (ref 70–99)
Glucose-Capillary: 109 mg/dL — ABNORMAL HIGH (ref 70–99)

## 2019-10-08 LAB — COMPREHENSIVE METABOLIC PANEL
ALT: 12 U/L (ref 0–44)
AST: 17 U/L (ref 15–41)
Albumin: 2.4 g/dL — ABNORMAL LOW (ref 3.5–5.0)
Alkaline Phosphatase: 49 U/L (ref 38–126)
Anion gap: 11 (ref 5–15)
BUN: 37 mg/dL — ABNORMAL HIGH (ref 8–23)
CO2: 24 mmol/L (ref 22–32)
Calcium: 8.6 mg/dL — ABNORMAL LOW (ref 8.9–10.3)
Chloride: 106 mmol/L (ref 98–111)
Creatinine, Ser: 1.23 mg/dL (ref 0.61–1.24)
GFR calc Af Amer: >60 mL/min (ref >60)
GFR calc non Af Amer: >60 mL/min (ref >60)
Glucose, Bld: 116 mg/dL — ABNORMAL HIGH (ref 70–99)
Potassium: 5.2 mmol/L — ABNORMAL HIGH (ref 3.5–5.1)
Sodium: 141 mmol/L (ref 135–145)
Total Bilirubin: 0.3 mg/dL (ref 0.3–1.2)
Total Protein: 6.9 g/dL (ref 6.5–8.1)

## 2019-10-08 LAB — HEPATITIS PANEL, ACUTE
HCV Ab: NONREACTIVE
Hep A IgM: NONREACTIVE
Hep B C IgM: NONREACTIVE
Hepatitis B Surface Ag: NONREACTIVE

## 2019-10-08 LAB — D-DIMER, QUANTITATIVE: D-Dimer, Quant: 1.06 ug/mL-FEU — ABNORMAL HIGH (ref 0.00–0.50)

## 2019-10-08 LAB — C-REACTIVE PROTEIN: CRP: 8.2 mg/dL — ABNORMAL HIGH (ref <1.0)

## 2019-10-08 LAB — RPR: RPR Ser Ql: NONREACTIVE

## 2019-10-08 LAB — HIV ANTIBODY (ROUTINE TESTING W REFLEX): HIV Screen 4th Generation wRfx: NONREACTIVE

## 2019-10-08 LAB — FOLATE: Folate: 9.6 ng/mL (ref >5.9)

## 2019-10-08 LAB — VITAMIN B12: Vitamin B-12: 566 pg/mL (ref 180–914)

## 2019-10-08 LAB — AMMONIA: Ammonia: 42 umol/L — ABNORMAL HIGH (ref 9–35)

## 2019-10-08 NOTE — Progress Notes (Signed)
Called to patient room due to patient getting out of bed, was found standing up beside the bed leaning over on guard rails, had removed clothing monitor leads and oxygen, patient stating he had to urinate and pulled off external foley, at that time he began to have liquid bm. Complete linen change and chg bath completed. Additional monitoring requested

## 2019-10-08 NOTE — Plan of Care (Signed)
Patient remains confused/lethargic, removed o2 multiple times during shift which caused an immediate drop in o2 sats to mid to lower 70's, telesitter put in place, has hepatic workup pending, hcg bath performed, safety precautions maintained, continue poc Problem: Education: Goal: Knowledge of risk factors and measures for prevention of condition will improve 10/08/2019 0642 by Terressa Koyanagi, RN Outcome: Not Progressing 10/08/2019 0641 by Terressa Koyanagi, RN Outcome: Not Progressing   Problem: Coping: Goal: Psychosocial and spiritual needs will be supported 10/08/2019 1700 by Terressa Koyanagi, RN Outcome: Not Progressing 10/08/2019 0641 by Terressa Koyanagi, RN Outcome: Not Progressing   Problem: Respiratory: Goal: Will maintain a patent airway 10/08/2019 0642 by Terressa Koyanagi, RN Outcome: Not Progressing 10/08/2019 0641 by Terressa Koyanagi, RN Outcome: Not Progressing Goal: Complications related to the disease process, condition or treatment will be avoided or minimized 10/08/2019 0642 by Terressa Koyanagi, RN Outcome: Not Progressing 10/08/2019 0641 by Terressa Koyanagi, RN Outcome: Not Progressing   Problem: Education: Goal: Knowledge of General Education information will improve Description: Including pain rating scale, medication(s)/side effects and non-pharmacologic comfort measures 10/08/2019 0642 by Terressa Koyanagi, RN Outcome: Not Progressing 10/08/2019 0641 by Terressa Koyanagi, RN Outcome: Not Progressing   Problem: Health Behavior/Discharge Planning: Goal: Ability to manage health-related needs will improve 10/08/2019 0642 by Terressa Koyanagi, RN Outcome: Not Progressing 10/08/2019 0641 by Terressa Koyanagi, RN Outcome: Not Progressing   Problem: Clinical Measurements: Goal: Ability to maintain clinical measurements within normal limits will improve 10/08/2019 0642 by Terressa Koyanagi, RN Outcome: Not Progressing 10/08/2019 0641 by Terressa Koyanagi,  RN Outcome: Not Progressing Goal: Will remain free from infection 10/08/2019 0642 by Terressa Koyanagi, RN Outcome: Not Progressing 10/08/2019 0641 by Terressa Koyanagi, RN Outcome: Not Progressing Goal: Diagnostic test results will improve 10/08/2019 0642 by Terressa Koyanagi, RN Outcome: Not Progressing 10/08/2019 0641 by Terressa Koyanagi, RN Outcome: Not Progressing Goal: Respiratory complications will improve 10/08/2019 0642 by Terressa Koyanagi, RN Outcome: Not Progressing 10/08/2019 0641 by Terressa Koyanagi, RN Outcome: Not Progressing Goal: Cardiovascular complication will be avoided 10/08/2019 1749 by Terressa Koyanagi, RN Outcome: Not Progressing 10/08/2019 0641 by Terressa Koyanagi, RN Outcome: Not Progressing   Problem: Activity: Goal: Risk for activity intolerance will decrease 10/08/2019 0642 by Terressa Koyanagi, RN Outcome: Not Progressing 10/08/2019 0641 by Terressa Koyanagi, RN Outcome: Not Progressing   Problem: Nutrition: Goal: Adequate nutrition will be maintained 10/08/2019 0642 by Terressa Koyanagi, RN Outcome: Not Progressing 10/08/2019 0641 by Terressa Koyanagi, RN Outcome: Not Progressing   Problem: Coping: Goal: Level of anxiety will decrease 10/08/2019 0642 by Terressa Koyanagi, RN Outcome: Not Progressing 10/08/2019 0641 by Terressa Koyanagi, RN Outcome: Not Progressing   Problem: Elimination: Goal: Will not experience complications related to bowel motility 10/08/2019 0642 by Terressa Koyanagi, RN Outcome: Not Progressing 10/08/2019 0641 by Terressa Koyanagi, RN Outcome: Not Progressing Goal: Will not experience complications related to urinary retention 10/08/2019 0642 by Terressa Koyanagi, RN Outcome: Not Progressing 10/08/2019 0641 by Terressa Koyanagi, RN Outcome: Not Progressing   Problem: Pain Managment: Goal: General experience of comfort will improve 10/08/2019 0642 by Terressa Koyanagi, RN Outcome: Not Progressing 10/08/2019 0641 by  Terressa Koyanagi, RN Outcome: Not Progressing   Problem: Safety: Goal: Ability to remain free from injury will improve 10/08/2019 0642 by Terressa Koyanagi, RN Outcome: Not Progressing 10/08/2019 0641 by Jean Rosenthal  Sherald Barge, RN Outcome: Not Progressing   Problem: Skin Integrity: Goal: Risk for impaired skin integrity will decrease 10/08/2019 0642 by Girtha Rm, RN Outcome: Not Progressing 10/08/2019 0641 by Girtha Rm, RN Outcome: Not Progressing

## 2019-10-08 NOTE — Progress Notes (Signed)
PT Cancellation Note  Patient Details Name: Russell Carter MRN: [id number] DOB: Jul 13, 1956   Cancelled Treatment:    Reason Eval/Treat Not Completed: Fatigue/lethargy limiting ability to participate. Pt asleep without [place] on and SpO2 at 78% on RA. Pt agitated with PT's attempt to replace [place], but eventually agreeable. Quick to return to SpO2 95% on 5L. Maintaining 93-98% on 3L. BP 161/82, HR 70.   Unable to arouse pt enough to participate with treatment session. Only answering to name at times, then mumbling in his sleep. Attempted to reposition in bed to encourage upright posture, but pt slides down in sidelying. Will follow-up for PT treatment as pt able to participate.   May require post-acute rehab therapies pending improvement in functional mobility and cognitive status.   Mabeline Caras, PT, DPT Acute Rehabilitation Services  Pager [phone] Office [place] 10/08/2019, 2:23 PM

## 2019-10-08 NOTE — Progress Notes (Signed)
Russell Carter  YBO:[id number] DOB: 08/04/1956 DOA: 10/07/2019 PCP: Marcine Matar, MD    Brief Narrative:  63 y.o.malewith medical history significant ofCAD, osteoarthritis, polysubstance abuse, chronic anticoagulation, diabetes, cocaine abuse, hyperlipidemia, cervical stenosis, anxiety disorder,hypertension, chronic kidney disease, seizure disorder, history of CVA, andbipolar disorderwho was recently admitted with COVID pneumonia, who returned with worsening symptoms.  He was discharged on 12/8.  He was no longer hypoxic.  His inflammatory markers have become normal.  Due to hypoxia patient was hospitalized for further management.  Significant Events: 12/11 admit via [facility] ED  COVID-19 specific Treatment: Remdesivir 12/12 > 12/16  Subjective: Much more alert today.  Remains confused.  Perseverates when asked questions.  Appears to be resting comfortably.  Denies shortness of breath or chest pain.  Assessment & Plan:  COVID Pneumonia -acute hypoxic respiratory failure Has completed a second course of remdesivir -continue Decadron -oxygen saturations stabilizing -continue to wean oxygen as able  Recent Labs  Lab 10/03/19 0120 10/04/19 0500 10/05/19 0419 10/06/19 0445 10/07/19 0130 10/08/19 0500  DDIMER 0.37 0.32 0.33 0.30 0.50 1.06*  FERRITIN 144 235 198 161 155  --   CRP 14.8* 8.8* 4.1* 1.7* 13.8* 8.2*  ALT 16 16 14 13 13 12    Acute metabolic encephalopathy Etiology not clear at this time -has a complex history of polysubstance abuse -no physiologic findings to suggest acute withdrawal -hepatic encephalopathy remains at the top of the differential -continue to treat this and follow -uremia is likely also contributing -appears to be improving at this time  Fall without apparent injury Patient suffered an unwitnessed fall in his room 12/16 -physical exam post fall did not reveal any clear evidence of injury/trauma -CT scan head post fall was without acute findings  -neurologic exam was impaired due to patient's delirium but otherwise nonfocal   Acute renal failure No prior history of kidney disease -creatinine now approaching normal  Right-sided upper abdominal discomfort He was experiencing some pain in his right upper quadrant a few days ago.  LFTs remain normal.  No tenderness appreciated today.  Looks like he has had this symptom on and off for a while.  He underwent biliary evaluation last year including ultrasound as well as HIDA scan both of which were unremarkable.  Continue with Pepcid for now. Exam remains benign  History of CAD Continue aspirin and statin  History of CVA Continue usual home medications  DM 2 -uncontrolled with hyperglycemia CBGs now well controlled   Gout Continue allopurinol  Bipolar disorder  Seizure disorder Continue Depakote and Vimpat -note Vimpat dose was adjusted due to renal failure and may need to be resumed at a higher dose if creatinine continues to trend down  HTN Blood pressure uncontrolled today -adjust medication doses and follow  BPH Continue Flomax  Chronic opioid use Med record shows patient on scheduled Percocet -holding for now due to altered mental status  Prior history of alcohol abuse Unclear if patient is still drinking at this time  DVT prophylaxis: Eliquis Code Status: FULL CODE Family Communication:  Disposition Plan: Telemetry  Consultants:  none  Antimicrobials:  None  Objective: Blood pressure (!) 157/78, pulse 76, temperature 98.6 F (37 C), temperature source Oral, resp. rate 17, height 5\' 11"  (1.803 m), weight 95.3 kg, SpO2 92 %.  Intake/Output Summary (Last 24 hours) at 10/08/2019 1208 Last data filed at 10/08/2019 1055 Gross per 24 hour  Intake 410 ml  Output 2200 ml  Net -1790 ml   1209  10/03/19 1642  Weight: 95.3 kg    Examination: General: No acute respiratory distress -more alert Lungs: Mild bibasilar crackles Cardiovascular: RRR  without murmur Abdomen: NT/ND, soft, BS positive, no rebound Extremities: No significant lower extremity edema  CBC: Recent Labs  Lab 10/05/19 0419 10/06/19 0445 10/07/19 0130 10/08/19 0500  WBC 18.3* 13.7* 15.4* 14.8*  NEUTROABS 16.6* 12.5* 14.3*  --   HGB 13.8 13.7 13.6 14.8  HCT 43.8 42.7 42.5 45.2  MCV 88.1 87.7 87.1 87.3  PLT 221 213 203 222   Basic Metabolic Panel: Recent Labs  Lab 10/03/19 0120 10/05/19 0419 10/06/19 0445 10/07/19 0130 10/08/19 0500  NA 139 135 135 141 141  K 4.2 4.7 4.9 5.4* 5.2*  CL 104 98 102 108 106  CO2 22 23 23 24 24   GLUCOSE 156* 132* 107* 87 116*  BUN 16 72* 65* 47* 37*  CREATININE 1.12 3.29* 1.89* 1.37* 1.23  CALCIUM 8.8* 8.6* 8.6* 8.5* 8.6*  MG 2.2 2.7* 2.6* 2.8*  --   PHOS 3.1  --   --   --   --    GFR: Estimated Creatinine Clearance: 71.5 mL/min (by C-G formula based on SCr of 1.23 mg/dL).  Liver Function Tests: Recent Labs  Lab 10/05/19 0419 10/06/19 0445 10/07/19 0130 10/08/19 0500  AST 19 16 17 17   ALT 14 13 13 12   ALKPHOS 40 38 39 49  BILITOT 0.8 0.5 0.5 0.3  PROT 6.5 6.4* 6.3* 6.9  ALBUMIN 2.9* 2.7* 2.6* 2.4*    Recent Labs  Lab 10/06/19 0444 10/07/19 0130 10/08/19 0500  AMMONIA 45* 56* 42*    HbA1C: HbA1c, POC (controlled diabetic range)  Date/Time Value Ref Range Status  06/06/2018 03:03 PM 5.7 0.0 - 7.0 % Final   Hgb A1c MFr Bld  Date/Time Value Ref Range Status  08/19/2019 05:41 PM 6.3 (H) 4.8 - 5.6 % Final    Comment:    (NOTE) Pre diabetes:          5.7%-6.4% Diabetes:              >6.4% Glycemic control for   <7.0% adults with diabetes   12/25/2018 08:01 AM 6.3 (H) 4.8 - 5.6 % Final    Comment:    (NOTE) Pre diabetes:          5.7%-6.4% Diabetes:              >6.4% Glycemic control for   <7.0% adults with diabetes     CBG: Recent Labs  Lab 10/07/19 1220 10/07/19 1655 10/07/19 1952 10/08/19 0729 10/08/19 1140  GLUCAP 106* 126* 137* 135* 119*    Recent Results (from the  past 240 hour(s))  MRSA PCR Screening     Status: None   Collection Time: 10/11/2019  6:12 PM   Specimen: Nasopharyngeal  Result Value Ref Range Status   MRSA by PCR NEGATIVE NEGATIVE Final    Comment:        The GeneXpert MRSA Assay (FDA approved for NASAL specimens only), is one component of a comprehensive MRSA colonization surveillance program. It is not intended to diagnose MRSA infection nor to guide or monitor treatment for MRSA infections. Performed at [facility], [address], [place], [place] [address]      Scheduled Meds: . allopurinol  300 mg Oral Daily  . apixaban  5 mg Oral BID  . aspirin EC  81 mg Oral Daily  . calcium-vitamin D  1 tablet Oral Daily  .  Chlorhexidine Gluconate Cloth  6 each Topical Daily  . dexamethasone (DECADRON) injection  6 mg Intravenous Q12H  . divalproex  1,000 mg Oral QHS  . divalproex  500 mg Oral q morning - 10a  . famotidine  20 mg Oral Daily  . folic acid  1 mg Oral Daily  . insulin aspart  0-5 Units Subcutaneous QHS  . insulin aspart  0-9 Units Subcutaneous TID WC  . insulin glargine  8 Units Subcutaneous Daily  . Ipratropium-Albuterol  1 puff Inhalation Q6H  . lacosamide  50 mg Oral BID  . lactulose  10 g Oral TID  . Magnesium Oxide  200 mg Oral QHS  . multivitamin with minerals  1 tablet Oral Daily  . sodium chloride flush  10-40 mL Intracatheter Q12H  . tamsulosin  0.4 mg Oral QHS  . thiamine  100 mg Oral Daily  . vitamin C  500 mg Oral Daily     LOS: 6 days   Cherene Altes, MD [facility] Office  [phone] Pager - Text Page per Amion  If 7PM-7AM, please contact night-coverage per Amion 10/08/2019, 12:08 PM

## 2019-10-09 LAB — CBC
HCT: 47.2 % (ref 39.0–52.0)
Hemoglobin: 15.3 g/dL (ref 13.0–17.0)
MCH: 28.2 pg (ref 26.0–34.0)
MCHC: 32.4 g/dL (ref 30.0–36.0)
MCV: 87.1 fL (ref 80.0–100.0)
Platelets: 226 10*3/uL (ref 150–400)
RBC: 5.42 MIL/uL (ref 4.22–5.81)
RDW: 14.8 % (ref 11.5–15.5)
WBC: 14.3 10*3/uL — ABNORMAL HIGH (ref 4.0–10.5)
nRBC: 0.1 % (ref 0.0–0.2)

## 2019-10-09 LAB — COMPREHENSIVE METABOLIC PANEL
ALT: 12 U/L (ref 0–44)
AST: 16 U/L (ref 15–41)
Albumin: 2.6 g/dL — ABNORMAL LOW (ref 3.5–5.0)
Alkaline Phosphatase: 48 U/L (ref 38–126)
Anion gap: 8 (ref 5–15)
BUN: 35 mg/dL — ABNORMAL HIGH (ref 8–23)
CO2: 27 mmol/L (ref 22–32)
Calcium: 8.7 mg/dL — ABNORMAL LOW (ref 8.9–10.3)
Chloride: 108 mmol/L (ref 98–111)
Creatinine, Ser: 1.15 mg/dL (ref 0.61–1.24)
GFR calc Af Amer: >60 mL/min (ref >60)
GFR calc non Af Amer: >60 mL/min (ref >60)
Glucose, Bld: 122 mg/dL — ABNORMAL HIGH (ref 70–99)
Potassium: 5.2 mmol/L — ABNORMAL HIGH (ref 3.5–5.1)
Sodium: 143 mmol/L (ref 135–145)
Total Bilirubin: 0.8 mg/dL (ref 0.3–1.2)
Total Protein: 6.8 g/dL (ref 6.5–8.1)

## 2019-10-09 LAB — GLUCOSE, CAPILLARY
Glucose-Capillary: 103 mg/dL — ABNORMAL HIGH (ref 70–99)
Glucose-Capillary: 187 mg/dL — ABNORMAL HIGH (ref 70–99)
Glucose-Capillary: 198 mg/dL — ABNORMAL HIGH (ref 70–99)
Glucose-Capillary: 136 mg/dL — ABNORMAL HIGH (ref 70–99)

## 2019-10-09 LAB — AMMONIA: Ammonia: 34 umol/L (ref 9–35)

## 2019-10-09 MED ORDER — LACOSAMIDE 50 MG PO TABS
100.0000 mg | ORAL_TABLET | Freq: Two times a day (BID) | ORAL | Status: DC
  Administered 2019-10-10 – 2019-10-18 (×18): 100 mg via ORAL
  Filled 2019-10-09 (×18): qty 2

## 2019-10-09 NOTE — Plan of Care (Signed)
Patient mentation remains the same as previously noted, lethargic to questioning and request, less impulsive, telesitter remains in place, started shift on 15L HFNC, currently on 2-3L, Patient family updated, chg bath given, safety precautions maintained, continue poc Problem: Education: Goal: Knowledge of risk factors and measures for prevention of condition will improve Outcome: Not Progressing   Problem: Coping: Goal: Psychosocial and spiritual needs will be supported Outcome: Not Progressing   Problem: Respiratory: Goal: Will maintain a patent airway Outcome: Not Progressing Goal: Complications related to the disease process, condition or treatment will be avoided or minimized Outcome: Not Progressing   Problem: Education: Goal: Knowledge of General Education information will improve Description: Including pain rating scale, medication(s)/side effects and non-pharmacologic comfort measures Outcome: Not Progressing   Problem: Health Behavior/Discharge Planning: Goal: Ability to manage health-related needs will improve Outcome: Not Progressing   Problem: Clinical Measurements: Goal: Ability to maintain clinical measurements within normal limits will improve Outcome: Not Progressing Goal: Will remain free from infection Outcome: Not Progressing Goal: Diagnostic test results will improve Outcome: Not Progressing Goal: Respiratory complications will improve Outcome: Not Progressing Goal: Cardiovascular complication will be avoided Outcome: Not Progressing   Problem: Activity: Goal: Risk for activity intolerance will decrease Outcome: Not Progressing   Problem: Nutrition: Goal: Adequate nutrition will be maintained Outcome: Not Progressing   Problem: Coping: Goal: Level of anxiety will decrease Outcome: Not Progressing   Problem: Elimination: Goal: Will not experience complications related to bowel motility Outcome: Not Progressing Goal: Will not experience  complications related to urinary retention Outcome: Not Progressing   Problem: Pain Managment: Goal: General experience of comfort will improve Outcome: Not Progressing   Problem: Safety: Goal: Ability to remain free from injury will improve Outcome: Not Progressing   Problem: Skin Integrity: Goal: Risk for impaired skin integrity will decrease Outcome: Not Progressing

## 2019-10-09 NOTE — Progress Notes (Signed)
Notified Dr. Joette Catching that patient is still lethargic, not following all commands, had a episode of staring off to the right for 30 sec before i could get a response from him, at same time oxygen sats in low 80's because patient had removed his oxygen. Notified of nurses concern that pt is so altered and ammonia level is 34 today.

## 2019-10-09 NOTE — Progress Notes (Signed)
Russell Carter  [id number]:[id number] DOB: 09/23/1956 DOA: 10-17-19 PCP: Marcine Matar, MD    Brief Narrative:  63 y.o.malewith medical history significant ofCAD, osteoarthritis, polysubstance abuse, chronic anticoagulation, diabetes, cocaine abuse, hyperlipidemia, cervical stenosis, anxiety disorder,hypertension, chronic kidney disease, seizure disorder, history of CVA, andbipolar disorderwho was recently admitted with COVID pneumonia, who returned with worsening symptoms.  He was discharged on 12/8.  He was no longer hypoxic.  His inflammatory markers have become normal.  Due to hypoxia patient was hospitalized for further management.  Significant Events: 12/11 admit via [facility]  COVID-19 specific Treatment: Remdesivir 12/12 > 12/16  Subjective: The patient's mental status continues to wax and wane.  His RN observed a prolonged staring spell this morning during which he was fully unresponsive and desaturated to an extent.  At the time of my exam today he is laying in bed with his eyes closed but will answer all of my questions and with significant prompting will follow my commands.  Assessment & Plan:  COVID Pneumonia -acute hypoxic respiratory failure Has completed a second course of remdesivir -continue Decadron -wean oxygen as able  Recent Labs  Lab 10/03/19 0120 10/04/19 0500 10/05/19 0419 10/06/19 0445 10/07/19 0130 10/08/19 0500 10/09/19 0500  DDIMER 0.37 0.32 0.33 0.30 0.50 1.06*  --   FERRITIN 144 235 198 161 155  --   --   CRP 14.8* 8.8* 4.1* 1.7* 13.8* 8.2*  --   ALT 16 16 14 13 13 12 12    Acute metabolic encephalopathy Etiology not clear at this time -has a complex history of polysubstance abuse -no physiologic findings to suggest acute withdrawal -hepatic encephalopathy being treated without significant stabilization of mental status -BUN trending downward -neurologic exam is nonfocal -unclear to me if this is metabolic or psychiatric -monitor for  recurrent staring episodes and consider EEG if this behavior is repeated  Recent Labs  Lab 10/06/19 0444 10/07/19 0130 10/08/19 0500 10/09/19 0500  AMMONIA 45* 56* 42* 34    Fall without apparent injury Patient suffered an unwitnessed fall in his room 12/16 -physical exam post fall did not reveal any clear evidence of injury/trauma -CT scan head post fall was without acute findings -neurologic exam remained stable with no change from the time of his fall   Acute renal failure No prior history of kidney disease -renal function has now normalized  Right-sided upper abdominal discomfort This appears to have resolved  History of CAD Continue aspirin and statin  History of CVA Continue usual home medications  DM 2 -uncontrolled with hyperglycemia CBGs controlled   Gout Continue allopurinol  Bipolar disorder Question the extent to which this is contributing to his current mental status fluctuations  Seizure disorder Continue Depakote and Vimpat - note Vimpat dose was adjusted due to renal failure and will now be returned to his prior dose  HTN Blood pressure improved -follow without change today  BPH Continue Flomax  Chronic opioid use Med record shows patient on scheduled Percocet -holding for now due to altered mental status  Prior history of alcohol abuse Unclear if patient is still drinking at this time  DVT prophylaxis: Eliquis Code Status: FULL CODE Family Communication:  Disposition Plan: Telemetry  Consultants:  none  Antimicrobials:  None  Objective: Blood pressure (!) 155/89, pulse 65, temperature 98.9 F (37.2 C), temperature source Axillary, resp. rate 20, height 5\' 11"  (1.803 m), weight 95.3 kg, SpO2 91 %.  Intake/Output Summary (Last 24 hours) at 10/09/2019 0946 Last data filed  at 10/09/2019 0718 Gross per 24 hour  Intake 120 ml  Output 1250 ml  Net -1130 ml   Filed Weights   10/03/19 1642  Weight: 95.3 kg    Examination: General:  No acute respiratory distress  Lungs: Mild bibasilar crackles -no wheezing Cardiovascular: RRR -no rub Abdomen: NT/ND, soft, BS positive, no rebound Extremities: No significant lower extremity edema Neuro: PERRLA - no cranial nerve deficits - facies symetrical - moves all 4 ext spontaneously to command - no babinski  CBC: Recent Labs  Lab 10/05/19 0419 10/06/19 0445 10/07/19 0130 10/08/19 0500 10/09/19 0500  WBC 18.3* 13.7* 15.4* 14.8* 14.3*  NEUTROABS 16.6* 12.5* 14.3*  --   --   HGB 13.8 13.7 13.6 14.8 15.3  HCT 43.8 42.7 42.5 45.2 47.2  MCV 88.1 87.7 87.1 87.3 87.1  PLT 221 213 203 222 195   Basic Metabolic Panel: Recent Labs  Lab 10/03/19 0120 10/05/19 0419 10/06/19 0445 10/07/19 0130 10/08/19 0500 10/09/19 0500  NA 139 135 135 141 141 143  K 4.2 4.7 4.9 5.4* 5.2* 5.2*  CL 104 98 102 108 106 108  CO2 22 23 23 24 24 27   GLUCOSE 156* 132* 107* 87 116* 122*  BUN 16 72* 65* 47* 37* 35*  CREATININE 1.12 3.29* 1.89* 1.37* 1.23 1.15  CALCIUM 8.8* 8.6* 8.6* 8.5* 8.6* 8.7*  MG 2.2 2.7* 2.6* 2.8*  --   --   PHOS 3.1  --   --   --   --   --    GFR: Estimated Creatinine Clearance: 76.5 mL/min (by C-G formula based on SCr of 1.15 mg/dL).  Liver Function Tests: Recent Labs  Lab 10/06/19 0445 10/07/19 0130 10/08/19 0500 10/09/19 0500  AST 16 17 17 16   ALT 13 13 12 12   ALKPHOS 38 39 49 48  BILITOT 0.5 0.5 0.3 0.8  PROT 6.4* 6.3* 6.9 6.8  ALBUMIN 2.7* 2.6* 2.4* 2.6*    Recent Labs  Lab 10/06/19 0444 10/07/19 0130 10/08/19 0500 10/09/19 0500  AMMONIA 45* 56* 42* 34    HbA1C: HbA1c, POC (controlled diabetic range)  Date/Time Value Ref Range Status  06/06/2018 03:03 PM 5.7 0.0 - 7.0 % Final   Hgb A1c MFr Bld  Date/Time Value Ref Range Status  08/19/2019 05:41 PM 6.3 (H) 4.8 - 5.6 % Final    Comment:    (NOTE) Pre diabetes:          5.7%-6.4% Diabetes:              >6.4% Glycemic control for   <7.0% adults with diabetes   12/25/2018 08:01 AM 6.3 (H)  4.8 - 5.6 % Final    Comment:    (NOTE) Pre diabetes:          5.7%-6.4% Diabetes:              >6.4% Glycemic control for   <7.0% adults with diabetes     CBG: Recent Labs  Lab 10/08/19 0729 10/08/19 1140 10/08/19 1537 10/08/19 2006 10/09/19 0714  GLUCAP 135* 119* 127* 109* 103*    Recent Results (from the past 240 hour(s))  MRSA PCR Screening     Status: None   Collection Time: 10/09/2019  6:12 PM   Specimen: Nasopharyngeal  Result Value Ref Range Status   MRSA by PCR NEGATIVE NEGATIVE Final    Comment:        The GeneXpert MRSA Assay (FDA approved for NASAL specimens only), is one component of a  comprehensive MRSA colonization surveillance program. It is not intended to diagnose MRSA infection nor to guide or monitor treatment for MRSA infections. Performed at [facility], [address], [place], [place] [address]      Scheduled Meds: . allopurinol  300 mg Oral Daily  . apixaban  5 mg Oral BID  . aspirin EC  81 mg Oral Daily  . calcium-vitamin D  1 tablet Oral Daily  . Chlorhexidine Gluconate Cloth  6 each Topical Daily  . dexamethasone (DECADRON) injection  6 mg Intravenous Q12H  . divalproex  1,000 mg Oral QHS  . divalproex  500 mg Oral q morning - 10a  . famotidine  20 mg Oral Daily  . folic acid  1 mg Oral Daily  . insulin aspart  0-5 Units Subcutaneous QHS  . insulin aspart  0-9 Units Subcutaneous TID WC  . insulin glargine  8 Units Subcutaneous Daily  . Ipratropium-Albuterol  1 puff Inhalation Q6H  . lacosamide  50 mg Oral BID  . lactulose  10 g Oral TID  . Magnesium Oxide  200 mg Oral QHS  . multivitamin with minerals  1 tablet Oral Daily  . sodium chloride flush  10-40 mL Intracatheter Q12H  . tamsulosin  0.4 mg Oral QHS  . thiamine  100 mg Oral Daily  . vitamin C  500 mg Oral Daily     LOS: 7 days   Lonia Blood, MD [facility] Office  [phone] Pager - Text Page per Amion  If 7PM-7AM, please  contact night-coverage per Amion 10/09/2019, 9:46 AM

## 2019-10-10 ENCOUNTER — Inpatient Hospital Stay (HOSPITAL_COMMUNITY): Payer: Medicare HMO

## 2019-10-10 DIAGNOSIS — J1289 Other viral pneumonia: Secondary | ICD-10-CM | POA: Diagnosis not present

## 2019-10-10 DIAGNOSIS — U071 COVID-19: Secondary | ICD-10-CM | POA: Diagnosis not present

## 2019-10-10 LAB — COMPREHENSIVE METABOLIC PANEL
ALT: 14 U/L (ref 0–44)
AST: 16 U/L (ref 15–41)
Albumin: 2.4 g/dL — ABNORMAL LOW (ref 3.5–5.0)
Alkaline Phosphatase: 40 U/L (ref 38–126)
Anion gap: 9 (ref 5–15)
BUN: 38 mg/dL — ABNORMAL HIGH (ref 8–23)
CO2: 26 mmol/L (ref 22–32)
Calcium: 8.8 mg/dL — ABNORMAL LOW (ref 8.9–10.3)
Chloride: 108 mmol/L (ref 98–111)
Creatinine, Ser: 1.35 mg/dL — ABNORMAL HIGH (ref 0.61–1.24)
GFR calc Af Amer: >60 mL/min (ref >60)
GFR calc non Af Amer: 55 mL/min — ABNORMAL LOW (ref >60)
Glucose, Bld: 138 mg/dL — ABNORMAL HIGH (ref 70–99)
Potassium: 5.4 mmol/L — ABNORMAL HIGH (ref 3.5–5.1)
Sodium: 143 mmol/L (ref 135–145)
Total Bilirubin: 0.2 mg/dL — ABNORMAL LOW (ref 0.3–1.2)
Total Protein: 6.6 g/dL (ref 6.5–8.1)

## 2019-10-10 LAB — POCT I-STAT 7, (LYTES, BLD GAS, ICA,H+H)
Acid-Base Excess: 2 mmol/L (ref 0.0–2.0)
Bicarbonate: 25.5 mmol/L (ref 20.0–28.0)
Calcium, Ion: 1.25 mmol/L (ref 1.15–1.40)
HCT: 45 % (ref 39.0–52.0)
Hemoglobin: 15.3 g/dL (ref 13.0–17.0)
O2 Saturation: 94 %
Patient temperature: 98
Potassium: 4.9 mmol/L (ref 3.5–5.1)
Sodium: 143 mmol/L (ref 135–145)
TCO2: 27 mmol/L (ref 22–32)
pCO2 arterial: 34.8 mmHg (ref 32.0–48.0)
pH, Arterial: 7.472 — ABNORMAL HIGH (ref 7.350–7.450)
pO2, Arterial: 63 mmHg — ABNORMAL LOW (ref 83.0–108.0)

## 2019-10-10 LAB — CBC
HCT: 46.9 % (ref 39.0–52.0)
Hemoglobin: 15.2 g/dL (ref 13.0–17.0)
MCH: 28.4 pg (ref 26.0–34.0)
MCHC: 32.4 g/dL (ref 30.0–36.0)
MCV: 87.5 fL (ref 80.0–100.0)
Platelets: 250 10*3/uL (ref 150–400)
RBC: 5.36 MIL/uL (ref 4.22–5.81)
RDW: 14.7 % (ref 11.5–15.5)
WBC: 15.2 10*3/uL — ABNORMAL HIGH (ref 4.0–10.5)
nRBC: 0.1 % (ref 0.0–0.2)

## 2019-10-10 LAB — GLUCOSE, CAPILLARY
Glucose-Capillary: 126 mg/dL — ABNORMAL HIGH (ref 70–99)
Glucose-Capillary: 141 mg/dL — ABNORMAL HIGH (ref 70–99)
Glucose-Capillary: 173 mg/dL — ABNORMAL HIGH (ref 70–99)
Glucose-Capillary: 158 mg/dL — ABNORMAL HIGH (ref 70–99)
Glucose-Capillary: 170 mg/dL — ABNORMAL HIGH (ref 70–99)

## 2019-10-10 LAB — AMMONIA: Ammonia: 38 umol/L — ABNORMAL HIGH (ref 9–35)

## 2019-10-10 MED ORDER — CLONIDINE HCL 0.1 MG PO TABS
0.1000 mg | ORAL_TABLET | Freq: Three times a day (TID) | ORAL | Status: DC
  Administered 2019-10-10 – 2019-10-18 (×25): 0.1 mg via ORAL
  Filled 2019-10-10 (×25): qty 1

## 2019-10-10 MED ORDER — HALOPERIDOL LACTATE 5 MG/ML IJ SOLN
2.0000 mg | Freq: Four times a day (QID) | INTRAMUSCULAR | Status: DC | PRN
  Administered 2019-10-10 – 2019-10-22 (×18): 2 mg via INTRAVENOUS
  Filled 2019-10-10 (×22): qty 1

## 2019-10-10 NOTE — Progress Notes (Signed)
Russell Carter  BOF:[id number] DOB: Jul 25, 1956 DOA: 10-25-2019 PCP: Marcine Matar, MD    Brief Narrative:  63 y.o.malewith medical history significant ofCAD, osteoarthritis, polysubstance abuse, chronic anticoagulation, diabetes, cocaine abuse, hyperlipidemia, cervical stenosis, anxiety disorder,hypertension, chronic kidney disease, seizure disorder, history of CVA, andbipolar disorderwho was recently admitted with COVID pneumonia, who returned with worsening symptoms.  He was discharged on 12/8.  He was no longer hypoxic.  His inflammatory markers have become normal.  Due to hypoxia patient was hospitalized for further management.  Significant Events: 12/11 admit via [facility]  COVID-19 specific Treatment: Remdesivir 12/12 > 12/16  Subjective: The patient's follow-up CXR today (reviewed by me) suggests possible progression of bilateral diffuse pulmonary infiltrates since his last CXR 7 days ago.  He was alert and oriented and cooperative this morning per his RN, but has become confused and agitated as his baseline.  At the time of my visit he was much more alert and interactive but when pushed with detailed questions was unable to answer them.  His neurologic exam is nonfocal.  Assessment & Plan:  COVID Pneumonia -acute hypoxic respiratory failure Has completed a second course of remdesivir -continue Decadron for now but decrease dose with a low threshold to discontinue - wean oxygen as able  Recent Labs  Lab 10/04/19 0500 10/05/19 0419 10/06/19 0445 10/07/19 0130 10/08/19 0500 10/09/19 0500 10/10/19 0748  DDIMER 0.32 0.33 0.30 0.50 1.06*  --   --   FERRITIN 235 198 161 155  --   --   --   CRP 8.8* 4.1* 1.7* 13.8* 8.2*  --   --   ALT 16 14 13 13 12 12 14    Acute metabolic encephalopathy Etiology not clear at this time -has a complex history of polysubstance abuse -no physiologic findings to suggest acute withdrawal -hepatic encephalopathy being treated without  significant stabilization of mental status -BUN trending downward -neurologic exam is nonfocal -unclear to me if this is metabolic or psychiatric -no further episodes to suggest this is seizure activity  Recent Labs  Lab 10/06/19 0444 10/07/19 0130 10/08/19 0500 10/09/19 0500 10/10/19 0748  AMMONIA 45* 56* 42* 34 38*    Fall without apparent injury Patient suffered an unwitnessed fall in his room 12/16 -physical exam post fall did not reveal any clear evidence of injury/trauma -CT scan head post fall was without acute findings -neurologic exam remained stable with no change from the time of his fall   Acute renal failure No prior history of kidney disease -renal function fluctuating, likely due to unreliable intake   Right-sided upper abdominal discomfort This appears to have resolved  History of CAD Continue aspirin and statin  History of CVA Continue usual home medications  DM 2 - uncontrolled with hyperglycemia CBGs controlled at this time  Gout Continue allopurinol  Bipolar disorder Question the extent to which this is contributing to his current mental status fluctuations - prn haldol for agitation and follow   Seizure disorder Continue Depakote and Vimpat - note Vimpat returned to his usual dose 12/18 w/ improved renal fxn   HTN Blood pressure fluctuating - add clonidine and follow   BPH Continue Flomax  Chronic opioid use Med record shows patient on scheduled Percocet -  holding for now due to altered mental status  Prior history of alcohol abuse Unclear if patient was drinking prior to admit   DVT prophylaxis: Eliquis Code Status: FULL CODE Family Communication:  Disposition Plan: Telemetry  Consultants:  none  Antimicrobials:  None  Objective: Blood pressure (!) 141/92, pulse 62, temperature 97.8 F (36.6 C), temperature source Oral, resp. rate 20, height 5\' 11"  (1.803 m), weight 102.9 kg, SpO2 94 %.  Intake/Output Summary (Last 24 hours) at  10/10/2019 0918 Last data filed at 10/10/2019 0355 Gross per 24 hour  Intake 0 ml  Output 850 ml  Net -850 ml   Filed Weights   10/03/19 1642 10/10/19 0351  Weight: 95.3 kg 102.9 kg    Examination: General: No acute respiratory distress  Lungs: CTA - no wheezing  Cardiovascular: RRR w/o rub  Abdomen: NT/ND, soft, BS positive, no rebound Extremities: No significant lower extremity edema Neuro: PERRLA - no cranial nerve deficits - facies symetrical - moves all 4 ext spontaneously at request   CBC: Recent Labs  Lab 10/05/19 0419 10/06/19 0445 10/07/19 0130 10/08/19 0500 10/09/19 0500 10/10/19 0458 10/10/19 0748  WBC 18.3* 13.7* 15.4* 14.8* 14.3*  --  15.2*  NEUTROABS 16.6* 12.5* 14.3*  --   --   --   --   HGB 13.8 13.7 13.6 14.8 15.3 15.3 15.2  HCT 43.8 42.7 42.5 45.2 47.2 45.0 46.9  MCV 88.1 87.7 87.1 87.3 87.1  --  87.5  PLT 221 213 203 222 226  --  250   Basic Metabolic Panel: Recent Labs  Lab 10/05/19 0419 10/06/19 0445 10/07/19 0130 10/08/19 0500 10/09/19 0500 10/10/19 0458 10/10/19 0748  NA 135 135 141 141 143 143 143  K 4.7 4.9 5.4* 5.2* 5.2* 4.9 5.4*  CL 98 102 108 106 108  --  108  CO2 23 23 24 24 27   --  26  GLUCOSE 132* 107* 87 116* 122*  --  138*  BUN 72* 65* 47* 37* 35*  --  38*  CREATININE 3.29* 1.89* 1.37* 1.23 1.15  --  1.35*  CALCIUM 8.6* 8.6* 8.5* 8.6* 8.7*  --  8.8*  MG 2.7* 2.6* 2.8*  --   --   --   --    GFR: Estimated Creatinine Clearance: 67.5 mL/min (A) (by C-G formula based on SCr of 1.35 mg/dL (H)).  Liver Function Tests: Recent Labs  Lab 10/07/19 0130 10/08/19 0500 10/09/19 0500 10/10/19 0748  AST 17 17 16 16   ALT 13 12 12 14   ALKPHOS 39 49 48 40  BILITOT 0.5 0.3 0.8 0.2*  PROT 6.3* 6.9 6.8 6.6  ALBUMIN 2.6* 2.4* 2.6* 2.4*    Recent Labs  Lab 10/06/19 0444 10/07/19 0130 10/08/19 0500 10/09/19 0500 10/10/19 0748  AMMONIA 45* 56* 42* 34 38*    HbA1C: HbA1c, POC (controlled diabetic range)  Date/Time Value Ref  Range Status  06/06/2018 03:03 PM 5.7 0.0 - 7.0 % Final   Hgb A1c MFr Bld  Date/Time Value Ref Range Status  08/19/2019 05:41 PM 6.3 (H) 4.8 - 5.6 % Final    Comment:    (NOTE) Pre diabetes:          5.7%-6.4% Diabetes:              >6.4% Glycemic control for   <7.0% adults with diabetes   12/25/2018 08:01 AM 6.3 (H) 4.8 - 5.6 % Final    Comment:    (NOTE) Pre diabetes:          5.7%-6.4% Diabetes:              >6.4% Glycemic control for   <7.0% adults with diabetes     CBG: Recent Labs  Lab 10/09/19  2353 10/09/19 1139 10/09/19 1605 10/09/19 2111 10/10/19 0722  GLUCAP 103* 187* 198* 136* 126*    Recent Results (from the past 240 hour(s))  MRSA PCR Screening     Status: None   Collection Time: 10/28/2019  6:12 PM   Specimen: Nasopharyngeal  Result Value Ref Range Status   MRSA by PCR NEGATIVE NEGATIVE Final    Comment:        The GeneXpert MRSA Assay (FDA approved for NASAL specimens only), is one component of a comprehensive MRSA colonization surveillance program. It is not intended to diagnose MRSA infection nor to guide or monitor treatment for MRSA infections. Performed at [facility], [place] [address], [place],  [address]      Scheduled Meds: . allopurinol  300 mg Oral Daily  . apixaban  5 mg Oral BID  . aspirin EC  81 mg Oral Daily  . calcium-vitamin D  1 tablet Oral Daily  . Chlorhexidine Gluconate Cloth  6 each Topical Daily  . dexamethasone (DECADRON) injection  6 mg Intravenous Q12H  . divalproex  1,000 mg Oral QHS  . divalproex  500 mg Oral q morning - 10a  . famotidine  20 mg Oral Daily  . folic acid  1 mg Oral Daily  . insulin aspart  0-5 Units Subcutaneous QHS  . insulin aspart  0-9 Units Subcutaneous TID WC  . insulin glargine  8 Units Subcutaneous Daily  . Ipratropium-Albuterol  1 puff Inhalation Q6H  . lacosamide  100 mg Oral BID  . lactulose  10 g Oral TID  . Magnesium Oxide  200 mg Oral QHS  .  multivitamin with minerals  1 tablet Oral Daily  . sodium chloride flush  10-40 mL Intracatheter Q12H  . tamsulosin  0.4 mg Oral QHS  . thiamine  100 mg Oral Daily  . vitamin C  500 mg Oral Daily     LOS: 8 days   Cherene Altes, MD [facility] Office  [phone] Pager - Text Page per Amion  If 7PM-7AM, please contact night-coverage per Amion 10/10/2019, 9:18 AM

## 2019-10-11 LAB — CBC
HCT: 46.7 % (ref 39.0–52.0)
Hemoglobin: 14.8 g/dL (ref 13.0–17.0)
MCH: 27.8 pg (ref 26.0–34.0)
MCHC: 31.7 g/dL (ref 30.0–36.0)
MCV: 87.6 fL (ref 80.0–100.0)
Platelets: 220 10*3/uL (ref 150–400)
RBC: 5.33 MIL/uL (ref 4.22–5.81)
RDW: 14.9 % (ref 11.5–15.5)
WBC: 20.9 10*3/uL — ABNORMAL HIGH (ref 4.0–10.5)
nRBC: 0.2 % (ref 0.0–0.2)

## 2019-10-11 LAB — BASIC METABOLIC PANEL
Anion gap: 10 (ref 5–15)
BUN: 39 mg/dL — ABNORMAL HIGH (ref 8–23)
CO2: 25 mmol/L (ref 22–32)
Calcium: 9.3 mg/dL (ref 8.9–10.3)
Chloride: 111 mmol/L (ref 98–111)
Creatinine, Ser: 1.29 mg/dL — ABNORMAL HIGH (ref 0.61–1.24)
GFR calc Af Amer: >60 mL/min (ref >60)
GFR calc non Af Amer: 58 mL/min — ABNORMAL LOW (ref >60)
Glucose, Bld: 111 mg/dL — ABNORMAL HIGH (ref 70–99)
Potassium: 4.9 mmol/L (ref 3.5–5.1)
Sodium: 146 mmol/L — ABNORMAL HIGH (ref 135–145)

## 2019-10-11 LAB — GLUCOSE, CAPILLARY: Glucose-Capillary: 84 mg/dL (ref 70–99)

## 2019-10-11 LAB — AMMONIA: Ammonia: 47 umol/L — ABNORMAL HIGH (ref 9–35)

## 2019-10-11 MED ORDER — FUROSEMIDE 10 MG/ML IJ SOLN
40.0000 mg | Freq: Once | INTRAMUSCULAR | Status: AC
  Administered 2019-10-11: 19:00:00 40 mg via INTRAVENOUS
  Filled 2019-10-11: qty 4

## 2019-10-11 MED ORDER — LACTULOSE 10 GM/15ML PO SOLN
20.0000 g | Freq: Three times a day (TID) | ORAL | Status: DC
  Administered 2019-10-11 – 2019-10-18 (×20): 20 g via ORAL
  Filled 2019-10-11 (×22): qty 30

## 2019-10-11 MED ORDER — FUROSEMIDE 10 MG/ML IJ SOLN
40.0000 mg | Freq: Once | INTRAMUSCULAR | Status: AC
  Administered 2019-10-11: 40 mg via INTRAVENOUS
  Filled 2019-10-11: qty 4

## 2019-10-11 NOTE — Progress Notes (Signed)
Son updated. All questions answered 

## 2019-10-11 NOTE — Progress Notes (Addendum)
Russell Carter  MHD:[id number] DOB: 1956/04/14 DOA: 09/25/2019 PCP: Ladell Pier, MD    Brief Narrative:  63 y.o.malewith medical history significant ofCAD, osteoarthritis, polysubstance abuse, chronic anticoagulation, diabetes, cocaine abuse, hyperlipidemia, cervical stenosis, anxiety disorder,hypertension, chronic kidney disease, seizure disorder, history of CVA, andbipolar disorderwho was recently admitted with COVID pneumonia, who returned with worsening symptoms.  He was discharged on 12/8.  He was no longer hypoxic.  His inflammatory markers have become normal.  Due to hypoxia patient was hospitalized for further management.  Significant Events: 12/11 admit via [facility]  COVID-19 specific Treatment: Remdesivir 12/12 > 12/16 Decadron 12/11 > 12/19  Subjective: The patient is awake and interactive at the time of my exam today.  He tends to perseverate when answering questions.  He cannot provide a detailed ROS.  He tells me he is short of breath.  He does not appear to be in acute distress.  Assessment & Plan:  COVID Pneumonia -acute hypoxic respiratory failure Has completed a second course of remdesivir - Decadron discontinued out of fear it was contributing to his acute delirium - wean oxygen as able  Recent Labs  Lab 10/05/19 0419 10/06/19 0445 10/07/19 0130 10/08/19 0500 10/09/19 0500 10/10/19 0748  DDIMER 0.33 0.30 0.50 1.06*  --   --   FERRITIN 198 161 155  --   --   --   CRP 4.1* 1.7* 13.8* 8.2*  --   --   ALT 14 13 13 12 12 14    Acute metabolic encephalopathy Etiology not clear at this time -has a complex history of polysubstance abuse -no physiologic findings to suggest acute withdrawal -hepatic encephalopathy being treated without significant stabilization of mental status, but increasing lactulose today as ammonia climbing - BUN not severely elevated at this time - neurologic exam is nonfocal -unclear to me if this is metabolic or psychiatric issue  -no further episodes to suggest this is seizure activity - continue to monitor   Recent Labs  Lab 10/07/19 0130 10/08/19 0500 10/09/19 0500 10/10/19 0748 10/11/19 0230  AMMONIA 100* 51* 76 38* 96*    Fall without apparent injury Patient suffered an unwitnessed fall in his room 12/16 -physical exam post fall did not reveal any clear evidence of injury/trauma -CT scan head post fall was without acute findings -neurologic exam remained stable with no change from the time of his fall   Acute renal failure No prior history of kidney disease -renal function appears to be improving further   Right-sided upper abdominal discomfort This appears to have resolved  History of CAD Continue aspirin and statin  History of CVA Continue usual home medications  DM 2 - uncontrolled with hyperglycemia CBGs well controlled at this time - cont to monitor trend   Gout Continue allopurinol  Bipolar disorder Question the extent to which this is contributing to his current mental status fluctuations - prn haldol for agitation - sitter   Seizure disorder Continue Depakote and Vimpat - note Vimpat returned to his usual dose 12/18 w/ improved renal fxn   HTN Blood pressure fluctuating - added clonidine 12/19 - cont to follow   BPH Continue Flomax  Chronic opioid use Med record shows patient on scheduled Percocet -  holding for now due to altered mental status  Prior history of alcohol abuse Unclear if patient was drinking prior to admit - should be beyond window for withdrawal at this time regardless   DVT prophylaxis: Eliquis Code Status: FULL CODE Family Communication:  Disposition Plan: Telemetry  Consultants:  none  Antimicrobials:  None  Objective: Blood pressure (!) 145/82, pulse 73, temperature 98.8 F (37.1 C), temperature source Oral, resp. rate 15, height 5\' 11"  (1.803 m), weight 102.9 kg, SpO2 93 %.  Intake/Output Summary (Last 24 hours) at 10/11/2019 1106 Last data  filed at 10/11/2019 0222 Gross per 24 hour  Intake 560 ml  Output 500 ml  Net 60 ml   Filed Weights   10/03/19 1642 10/10/19 0351  Weight: 95.3 kg 102.9 kg    Examination: General: No acute respiratory distress  Lungs: CTA w/o wheezing or crackles  Cardiovascular: RRR  Abdomen: NT/ND, soft, BS+, no rebound Extremities: No C/C/E B LE  Neuro: PERRLA - no cranial nerve deficits - facies symetrical - moves all 4 ext spontaneously  CBC: Recent Labs  Lab 10/05/19 0419 10/06/19 0445 10/07/19 0130 10/09/19 0500 10/10/19 0458 10/10/19 0748 10/11/19 0230  WBC 18.3* 13.7* 15.4* 14.3*  --  15.2* 20.9*  NEUTROABS 16.6* 12.5* 14.3*  --   --   --   --   HGB 13.8 13.7 13.6 15.3 15.3 15.2 14.8  HCT 43.8 42.7 42.5 47.2 45.0 46.9 46.7  MCV 88.1 87.7 87.1 87.1  --  87.5 87.6  PLT 221 213 203 226  --  250 220   Basic Metabolic Panel: Recent Labs  Lab 10/05/19 0419 10/06/19 0445 10/07/19 0130 10/09/19 0500 10/10/19 0458 10/10/19 0748 10/11/19 0230  NA 135 135 141 143 143 143 146*  K 4.7 4.9 5.4* 5.2* 4.9 5.4* 4.9  CL 98 102 108 108  --  108 111  CO2 23 23 24 27   --  26 25  GLUCOSE 132* 107* 87 122*  --  138* 111*  BUN 72* 65* 47* 35*  --  38* 39*  CREATININE 3.29* 1.89* 1.37* 1.15  --  1.35* 1.29*  CALCIUM 8.6* 8.6* 8.5* 8.7*  --  8.8* 9.3  MG 2.7* 2.6* 2.8*  --   --   --   --    GFR: Estimated Creatinine Clearance: 70.6 mL/min (A) (by C-G formula based on SCr of 1.29 mg/dL (H)).  Liver Function Tests: Recent Labs  Lab 10/07/19 0130 10/08/19 0500 10/09/19 0500 10/10/19 0748  AST 17 17 16 16   ALT 13 12 12 14   ALKPHOS 39 49 48 40  BILITOT 0.5 0.3 0.8 0.2*  PROT 6.3* 6.9 6.8 6.6  ALBUMIN 2.6* 2.4* 2.6* 2.4*    Recent Labs  Lab 10/07/19 0130 10/08/19 0500 10/09/19 0500 10/10/19 0748 10/11/19 0230  AMMONIA 56* 42* 34 38* 47*    HbA1C: HbA1c, POC (controlled diabetic range)  Date/Time Value Ref Range Status  06/06/2018 03:03 PM 5.7 0.0 - 7.0 % Final   Hgb  A1c MFr Bld  Date/Time Value Ref Range Status  08/19/2019 05:41 PM 6.3 (H) 4.8 - 5.6 % Final    Comment:    (NOTE) Pre diabetes:          5.7%-6.4% Diabetes:              >6.4% Glycemic control for   <7.0% adults with diabetes   12/25/2018 08:01 AM 6.3 (H) 4.8 - 5.6 % Final    Comment:    (NOTE) Pre diabetes:          5.7%-6.4% Diabetes:              >6.4% Glycemic control for   <7.0% adults with diabetes     CBG: Recent Labs  Lab 10/10/19 0950 10/10/19 1129 10/10/19 1758 10/10/19 2015 10/11/19 0806  GLUCAP 141* 173* 158* 170* 84    Recent Results (from the past 240 hour(s))  MRSA PCR Screening     Status: None   Collection Time: 23-Oct-2019  6:12 PM   Specimen: Nasopharyngeal  Result Value Ref Range Status   MRSA by PCR NEGATIVE NEGATIVE Final    Comment:        The GeneXpert MRSA Assay (FDA approved for NASAL specimens only), is one component of a comprehensive MRSA colonization surveillance program. It is not intended to diagnose MRSA infection nor to guide or monitor treatment for MRSA infections. Performed at [facility], [address] [place], [place] [address]      Scheduled Meds: . allopurinol  300 mg Oral Daily  . apixaban  5 mg Oral BID  . aspirin EC  81 mg Oral Daily  . calcium-vitamin D  1 tablet Oral Daily  . Chlorhexidine Gluconate Cloth  6 each Topical Daily  . cloNIDine  0.1 mg Oral TID  . divalproex  1,000 mg Oral QHS  . divalproex  500 mg Oral q morning - 10a  . famotidine  20 mg Oral Daily  . folic acid  1 mg Oral Daily  . Ipratropium-Albuterol  1 puff Inhalation Q6H  . lacosamide  100 mg Oral BID  . lactulose  10 g Oral TID  . Magnesium Oxide  200 mg Oral QHS  . multivitamin with minerals  1 tablet Oral Daily  . sodium chloride flush  10-40 mL Intracatheter Q12H  . tamsulosin  0.4 mg Oral QHS  . thiamine  100 mg Oral Daily  . vitamin C  500 mg Oral Daily     LOS: 9 days   Lonia Blood, MD [facility] Office  [phone] Pager - Text Page per Amion  If 7PM-7AM, please contact night-coverage per Amion 10/11/2019, 11:06 AM

## 2019-10-12 ENCOUNTER — Inpatient Hospital Stay (HOSPITAL_COMMUNITY): Payer: Medicare HMO

## 2019-10-12 DIAGNOSIS — J1289 Other viral pneumonia: Secondary | ICD-10-CM | POA: Diagnosis not present

## 2019-10-12 DIAGNOSIS — U071 COVID-19: Secondary | ICD-10-CM | POA: Diagnosis not present

## 2019-10-12 LAB — COMPREHENSIVE METABOLIC PANEL
ALT: 19 U/L (ref 0–44)
AST: 24 U/L (ref 15–41)
Albumin: 2.6 g/dL — ABNORMAL LOW (ref 3.5–5.0)
Alkaline Phosphatase: 56 U/L (ref 38–126)
Anion gap: 13 (ref 5–15)
BUN: 31 mg/dL — ABNORMAL HIGH (ref 8–23)
CO2: 27 mmol/L (ref 22–32)
Calcium: 9.1 mg/dL (ref 8.9–10.3)
Chloride: 101 mmol/L (ref 98–111)
Creatinine, Ser: 1.45 mg/dL — ABNORMAL HIGH (ref 0.61–1.24)
GFR calc Af Amer: 59 mL/min — ABNORMAL LOW (ref >60)
GFR calc non Af Amer: 51 mL/min — ABNORMAL LOW (ref >60)
Glucose, Bld: 81 mg/dL (ref 70–99)
Potassium: 4.6 mmol/L (ref 3.5–5.1)
Sodium: 141 mmol/L (ref 135–145)
Total Bilirubin: 0.4 mg/dL (ref 0.3–1.2)
Total Protein: 6.9 g/dL (ref 6.5–8.1)

## 2019-10-12 LAB — GLUCOSE, CAPILLARY: Glucose-Capillary: 182 mg/dL — ABNORMAL HIGH (ref 70–99)

## 2019-10-12 LAB — VITAMIN D 25 HYDROXY (VIT D DEFICIENCY, FRACTURES): Vit D, 25-Hydroxy: 46.99 ng/mL (ref 30–100)

## 2019-10-12 LAB — TSH: TSH: 2.246 u[IU]/mL (ref 0.350–4.500)

## 2019-10-12 NOTE — Progress Notes (Signed)
Russell Carter  DUK:[id number] DOB: 03-Jun-1956 DOA: 10/11/2019 PCP: Ladell Pier, MD    Brief Narrative:  63 y.o.malewith medical history significant ofCAD, osteoarthritis, polysubstance abuse, chronic anticoagulation, diabetes, cocaine abuse, hyperlipidemia, cervical stenosis, anxiety disorder,hypertension, chronic kidney disease, seizure disorder, history of CVA, andbipolar disorderwho was recently admitted with COVID pneumonia, who returned with worsening symptoms.  He was discharged on 12/8.  He was no longer hypoxic.  His inflammatory markers have become normal.  Due to hypoxia patient was hospitalized for further management.  Significant Events: 12/11 admit via [facility]  COVID-19 specific Treatment: Remdesivir 12/12 > 12/16 Decadron 12/11 > 12/19  Subjective: F/U CXR today w/o signif change in B pulm infiltrates to my reading.  The patient remains confused but is more interactive.  He cannot provide a detailed ROS.  Assessment & Plan:  COVID Pneumonia -acute hypoxic respiratory failure Has completed a second course of remdesivir - Decadron discontinued out of fear it was contributing to his acute delirium - wean oxygen as able  Acute metabolic encephalopathy Etiology remains unclear - has a complex history of polysubstance abuse -no physiologic findings to suggest acute withdrawal -hepatic encephalopathy being treated without significant stabilization of mental status - BUN not severely elevated at this time - neurologic exam is nonfocal -unclear to me if this is metabolic or psychiatric issue -no further episodes to suggest this is seizure activity - continue to monitor   Recent Labs  Lab 10/07/19 0130 10/08/19 0500 10/09/19 0500 10/10/19 0748 10/11/19 0230  AMMONIA 44* 66* 34 32* 73*    Fall without apparent injury Patient suffered an unwitnessed fall in his room 12/16 -physical exam post fall did not reveal any clear evidence of injury/trauma -CT scan  head post fall was without acute findings -neurologic exam remained stable with no change from the time of his fall   Acute renal failure No prior history of kidney disease -continue to follow renal function intermittently  Right-sided upper abdominal discomfort This appears to have resolved  History of CAD Continue aspirin and statin  History of CVA Continue usual home medications  DM 2 - uncontrolled with hyperglycemia CBGs well controlled at this time - cont to monitor trend   Gout Continue allopurinol  Bipolar disorder Question the extent to which this is contributing to his current mental status fluctuations - prn haldol for agitation - sitter   Seizure disorder Continue Depakote and Vimpat - note Vimpat returned to his usual dose 12/18 w/ improved renal fxn   HTN Blood pressure fluctuating - added clonidine 12/19 - cont to follow   BPH Continue Flomax  Chronic opioid use Med record shows patient on scheduled Percocet -  holding for now due to altered mental status  Prior history of alcohol abuse Unclear if patient was drinking prior to admit - should be beyond window for withdrawal at this time regardless   DVT prophylaxis: Eliquis Code Status: FULL CODE Family Communication:  Disposition Plan: Telemetry  Consultants:  none  Antimicrobials:  None  Objective: Blood pressure 117/64, pulse 91, temperature 97.7 F (36.5 C), temperature source Axillary, resp. rate 19, height 5\' 11"  (1.803 m), weight 102.9 kg, SpO2 98 %.  Intake/Output Summary (Last 24 hours) at 10/12/2019 1721 Last data filed at 10/12/2019 1200 Gross per 24 hour  Intake 1120 ml  Output 400 ml  Net 720 ml   Filed Weights   10/03/19 1642 10/10/19 0351  Weight: 95.3 kg 102.9 kg    Examination: General: No  acute respiratory distress  Lungs: Fine scattered crackles bilaterally with no wheezing Cardiovascular: RRR  Abdomen: NT/ND, soft, BS+, no rebound Extremities: No C/C/E B LE   Neuro: no cranial nerve deficits - facies symetrical - moves all 4 ext spontaneously  CBC: Recent Labs  Lab 10/06/19 0445 10/07/19 0130 10/09/19 0500 10/10/19 0458 10/10/19 0748 10/11/19 0230  WBC 13.7* 15.4* 14.3*  --  15.2* 20.9*  NEUTROABS 12.5* 14.3*  --   --   --   --   HGB 13.7 13.6 15.3 15.3 15.2 14.8  HCT 42.7 42.5 47.2 45.0 46.9 46.7  MCV 87.7 87.1 87.1  --  87.5 87.6  PLT 213 203 226  --  250 220   Basic Metabolic Panel: Recent Labs  Lab 10/06/19 0445 10/07/19 0130 10/10/19 0748 10/11/19 0230 10/12/19 0500  NA 135 141 143 146* 141  K 4.9 5.4* 5.4* 4.9 4.6  CL 102 108 108 111 101  CO2 23 24 26 25 27   GLUCOSE 107* 87 138* 111* 81  BUN 65* 47* 38* 39* 31*  CREATININE 1.89* 1.37* 1.35* 1.29* 1.45*  CALCIUM 8.6* 8.5* 8.8* 9.3 9.1  MG 2.6* 2.8*  --   --   --    GFR: Estimated Creatinine Clearance: 62.8 mL/min (A) (by C-G formula based on SCr of 1.45 mg/dL (H)).  Liver Function Tests: Recent Labs  Lab 10/08/19 0500 10/09/19 0500 10/10/19 0748 10/12/19 0500  AST 17 16 16 24   ALT 12 12 14 19   ALKPHOS 49 48 40 56  BILITOT 0.3 0.8 0.2* 0.4  PROT 6.9 6.8 6.6 6.9  ALBUMIN 2.4* 2.6* 2.4* 2.6*    Recent Labs  Lab 10/07/19 0130 10/08/19 0500 10/09/19 0500 10/10/19 0748 10/11/19 0230  AMMONIA 56* 42* 34 38* 47*    HbA1C: HbA1c, POC (controlled diabetic range)  Date/Time Value Ref Range Status  06/06/2018 03:03 PM 5.7 0.0 - 7.0 % Final   Hgb A1c MFr Bld  Date/Time Value Ref Range Status  08/19/2019 05:41 PM 6.3 (H) 4.8 - 5.6 % Final    Comment:    (NOTE) Pre diabetes:          5.7%-6.4% Diabetes:              >6.4% Glycemic control for   <7.0% adults with diabetes   12/25/2018 08:01 AM 6.3 (H) 4.8 - 5.6 % Final    Comment:    (NOTE) Pre diabetes:          5.7%-6.4% Diabetes:              >6.4% Glycemic control for   <7.0% adults with diabetes     CBG: Recent Labs  Lab 10/10/19 1129 10/10/19 1231 10/10/19 1758 10/10/19 2015  10/11/19 0806  GLUCAP 173* 182* 158* 170* 84    Recent Results (from the past 240 hour(s))  MRSA PCR Screening     Status: None   Collection Time: 10/04/2019  6:12 PM   Specimen: Nasopharyngeal  Result Value Ref Range Status   MRSA by PCR NEGATIVE NEGATIVE Final    Comment:        The GeneXpert MRSA Assay (FDA approved for NASAL specimens only), is one component of a comprehensive MRSA colonization surveillance program. It is not intended to diagnose MRSA infection nor to guide or monitor treatment for MRSA infections. Performed at [facility], [address], [place], [place]      Scheduled Meds: . allopurinol  300 mg Oral  Daily  . apixaban  5 mg Oral BID  . aspirin EC  81 mg Oral Daily  . calcium-vitamin D  1 tablet Oral Daily  . Chlorhexidine Gluconate Cloth  6 each Topical Daily  . cloNIDine  0.1 mg Oral TID  . divalproex  1,000 mg Oral QHS  . divalproex  500 mg Oral q morning - 10a  . famotidine  20 mg Oral Daily  . folic acid  1 mg Oral Daily  . Ipratropium-Albuterol  1 puff Inhalation Q6H  . lacosamide  100 mg Oral BID  . lactulose  20 g Oral TID  . Magnesium Oxide  200 mg Oral QHS  . multivitamin with minerals  1 tablet Oral Daily  . sodium chloride flush  10-40 mL Intracatheter Q12H  . tamsulosin  0.4 mg Oral QHS  . thiamine  100 mg Oral Daily  . vitamin C  500 mg Oral Daily     LOS: 10 days   Lonia Blood, MD [facility] Office  [phone] Pager - Text Page per Amion  If 7PM-7AM, please contact night-coverage per Amion 10/12/2019, 5:21 PM

## 2019-10-12 NOTE — Progress Notes (Addendum)
Son updated. All questions answered

## 2019-10-12 NOTE — Progress Notes (Signed)
PRN Haldol given, patient keeps taking off O2 prob, tele, and condom cath.

## 2019-10-12 NOTE — Progress Notes (Signed)
OT Treatment Note  Pt with significant decline in functional level due to apparent cognitive decline, requiring Mod A +2 for sit - stand and overall max to total A with ADL tasks. Recommend SNF for rehab. Will continue to follow acutely.     10/12/19 1800  OT Visit Information  Last OT Received On 10/12/19  Assistance Needed +2  PT/OT/SLP Co-Evaluation/Treatment Yes  Reason for Co-Treatment Complexity of the patient's impairments (multi-system involvement);For patient/therapist safety;Necessary to address cognition/behavior during functional activity;To address functional/ADL transfers  OT goals addressed during session ADL's and self-care  History of Present Illness Pt is a 63 y.o. male recently admitted to [facility] four days with d/c home 09/29/19, now readmitted 10/20/2019 with worsening SOB and headache. PMH includes seizures, CAD, stroke, heart block, cervical stenosis s/p neck sx with subsequent L-side pain/weakness, CKD, DM2, substance abuse, chronic opiate use.  Precautions  Precautions Fall  Pain Assessment  Faces Pain Scale 4  Pain Location general pain seemed to be thashing around in bed trying to get comfortable and once sitting EOB kept leaning down   Cognition  Arousal/Alertness Lethargic  Behavior During Therapy Agitated;Restless  Overall Cognitive Status Impaired/Different from baseline  Area of Impairment Orientation;Attention;Memory;Following commands;Safety/judgement;Awareness;Problem solving  Orientation Level Disoriented to;Place;Time;Situation  Current Attention Level Focused  Memory Decreased recall of precautions;Decreased short-term memory  Following Commands Follows one step commands inconsistently  Safety/Judgement Decreased awareness of safety;Decreased awareness of deficits  Awareness Intellectual  Problem Solving Slow processing;Decreased initiation;Difficulty sequencing;Requires verbal cues;Requires tactile cues  Upper Extremity Assessment  Upper Extremity  Assessment Generalized weakness  Lower Extremity Assessment  Lower Extremity Assessment Defer to PT evaluation  ADL  Eating/Feeding Moderate assistance (hand over hand to maintain grasp on cup)  Grooming Maximal assistance  Upper Body Bathing Total assistance  Upper Body Dressing  Total assistance  Functional mobility during ADLs Moderate assistance;+2 for physical assistance  General ADL Comments significant decline in function  Bed Mobility  Overal bed mobility Needs Assistance  Bed Mobility Supine to Sit;Sit to Supine  Supine to sit +2 for physical assistance;Mod assist  Sit to supine +2 for physical assistance;Mod assist  Balance  Overall balance assessment Needs assistance  Sitting-balance support Feet supported  Sitting balance-Leahy Scale Fair  Standing balance-Leahy Scale Poor  Restrictions  Weight Bearing Restrictions No  Transfers  Overall transfer level Needs assistance  Equipment used 2 person hand held assist  Transfers Sit to/from Stand  Sit to Stand +2 physical assistance;Mod assist  General transfer comment able to stand at EOB x 2 with mod a x2 assist on each arm  OT - End of Session  Activity Tolerance Patient limited by fatigue  Patient left in bed;with call bell/phone within reach;with bed alarm set;with restraints reapplied;Other (comment) (bed pad alarm set)  Nurse Communication Mobility status  OT Assessment/Plan  OT Plan Frequency needs to be updated;Discharge plan needs to be updated  OT Visit Diagnosis Unsteadiness on feet (R26.81);Other abnormalities of gait and mobility (R26.89);Muscle weakness (generalized) (M62.81);Other symptoms and signs involving cognitive function  OT Frequency (ACUTE ONLY) Min 2X/week  Follow Up Recommendations SNF;Supervision/Assistance - 24 hour  OT Equipment Other (comment) (TBA)  AM-PAC OT "6 Clicks" Daily Activity Outcome Measure (Version 2)  Help from another person eating meals? 2  Help from another person taking  care of personal grooming? 2  Help from another person toileting, which includes using toliet, bedpan, or urinal? 1  Help from another person bathing (including washing, rinsing, drying)? 1  Help from  another person to put on and taking off regular upper body clothing? 1  Help from another person to put on and taking off regular lower body clothing? 1  6 Click Score 8  OT Goal Progression  Progress towards OT goals Not progressing toward goals - comment;OT to reassess next treatment (decline in cognitive status)  Acute Rehab OT Goals  Patient Stated Goal unable to state  OT Goal Formulation Patient unable to participate in goal setting  Time For Goal Achievement 10/18/19  Potential to Achieve Goals Fair  ADL Goals  Pt Will Perform Grooming with modified independence;standing  Pt Will Perform Lower Body Bathing with modified independence;sit to/from stand  Pt Will Perform Lower Body Dressing with modified independence;sit to/from stand  Pt Will Transfer to Toilet with modified independence;ambulating;regular height toilet  Pt Will Perform Toileting - Clothing Manipulation and hygiene with modified independence;sit to/from stand  Additional ADL Goal #1 Pt to recall and demonstrate breathing exercises with 0 verbal cues.  OT Time Calculation  OT Start Time (ACUTE ONLY) 1455  OT Stop Time (ACUTE ONLY) 1521  OT Time Calculation (min) 26 min  OT General Charges  $OT Visit 1 Visit  OT Treatments  $Self Care/Home Management  8-22 mins  Maurie Boettcher, OT/L   Acute OT Clinical Specialist [place] Pager [phone] Office [phone]

## 2019-10-12 NOTE — Evaluation (Signed)
Physical Therapy Treatment Patient Details Name: Russell Carter MRN: [id number] DOB: 02/25/1956 Today's Date: 10/12/2019    History of Present Illness Pt is a 63 y.o. male recently admitted to [facility] four days with d/c home 09/29/19, now readmitted 10/12/19 with worsening SOB and headache. PMH includes seizures, CAD, stroke, heart block, cervical stenosis s/p neck sx with subsequent L-side pain/weakness, CKD, DM2, substance abuse, chronic opiate use.    PT Comments    Pt seems to have had decline in function as well as cognition since initial evaluation. This pm was needing mod a x 2 to complete tasks, more so for safety. Pt was found in B wrist restraints and mitts on place. Pt was able to sit EOB with some support, needed continued redirection away from attempting to pull mitts off or pull at catheter. Pt was able to stand with mod a x 2, HHA, noted that in stance pt is shaking and knees are buckling, hence did not attempt ambulation. Pt was on 4L/min at start of session and was increased to 6L/min as he was noted to desat into low 80s and at times into 70s. Pleth was not uniform, pedi earlobe probe was changed from left ear to right and gave similar readings. With desaturation pt was noted to recover quickly. Will continue to follow acutely and have changed recommendation for discharge to SNF.     Follow Up Recommendations  SNF     Equipment Recommendations  None recommended by PT    Recommendations for Other Services       Precautions / Restrictions Precautions Precautions: Fall Restrictions Weight Bearing Restrictions: No    Mobility  Bed Mobility Overal bed mobility: Needs Assistance Bed Mobility: Supine to Sit;Sit to Supine     Supine to sit: +2 for physical assistance;Mod assist Sit to supine: +2 for physical assistance;Mod assist      Transfers Overall transfer level: Needs assistance Equipment used: 2 person hand held assist Transfers: Sit to/from Stand Sit to  Stand: +2 physical assistance;Mod assist         General transfer comment: able to stand at EOB x 2 with mod a x2 assist on each arm  Ambulation/Gait             General Gait Details: did not attempt ambulation this session pt is shaking with standing and knees buckling   Stairs             Wheelchair Mobility    Modified Rankin (Stroke Patients Only)       Balance Overall balance assessment: Needs assistance Sitting-balance support: Feet supported Sitting balance-Leahy Scale: Fair       Standing balance-Leahy Scale: Poor                              Cognition Arousal/Alertness: Lethargic Behavior During Therapy: Agitated;Restless Overall Cognitive Status: Impaired/Different from baseline Area of Impairment: Orientation;Attention;Memory;Following commands;Safety/judgement;Awareness;Problem solving                 Orientation Level: Disoriented to;Place;Time;Situation Current Attention Level: Alternating Memory: Decreased recall of precautions;Decreased short-term memory Following Commands: Follows one step commands inconsistently Safety/Judgement: Decreased awareness of safety;Decreased awareness of deficits   Problem Solving: Slow processing;Decreased initiation;Difficulty sequencing;Requires verbal cues;Requires tactile cues        Exercises      General Comments        Pertinent Vitals/Pain Pain Assessment: Faces Faces Pain Scale: Hurts little more Pain Location:  general pain seemed to be thashing around in bed trying to get comfortable and once sitting EOB kept leaning down  Pain Intervention(s): Limited activity within patient's tolerance    Home Living                      Prior Function            PT Goals (current goals can now be found in the care plan section) Acute Rehab PT Goals Patient Stated Goal: did not voice today Time For Goal Achievement: 10/17/19 Potential to Achieve Goals: Fair Progress  towards PT goals: (had had decline in cognition and function)    Frequency    Min 3X/week      PT Plan Discharge plan needs to be updated    Co-evaluation PT/OT/SLP Co-Evaluation/Treatment: Yes Reason for Co-Treatment: Complexity of the patient's impairments (multi-system involvement);Necessary to address cognition/behavior during functional activity;For patient/therapist safety PT goals addressed during session: Mobility/safety with mobility;Balance;Strengthening/ROM        AM-PAC PT "6 Clicks" Mobility   Outcome Measure  Help needed turning from your back to your side while in a flat bed without using bedrails?: None Help needed moving from lying on your back to sitting on the side of a flat bed without using bedrails?: A Lot Help needed moving to and from a bed to a chair (including a wheelchair)?: A Lot Help needed standing up from a chair using your arms (e.g., wheelchair or bedside chair)?: A Lot Help needed to walk in hospital room?: Total Help needed climbing 3-5 steps with a railing? : Total 6 Click Score: 12    End of Session Equipment Utilized During Treatment: Oxygen Activity Tolerance: Patient limited by lethargy;Patient limited by fatigue Patient left: in bed;with call bell/phone within reach Nurse Communication: Mobility status PT Visit Diagnosis: Difficulty in walking, not elsewhere classified (R26.2);Other abnormalities of gait and mobility (R26.89)     Time: 1450-1521 PT Time Calculation (min) (ACUTE ONLY): 31 min  Charges:  $Therapeutic Activity: 8-22 mins                     Drema Pry, PT    Russell Carter 10/12/2019, 4:21 PM

## 2019-10-13 ENCOUNTER — Inpatient Hospital Stay (HOSPITAL_COMMUNITY): Payer: Medicare HMO

## 2019-10-13 DIAGNOSIS — S0990XA Unspecified injury of head, initial encounter: Secondary | ICD-10-CM | POA: Diagnosis not present

## 2019-10-13 DIAGNOSIS — R4182 Altered mental status, unspecified: Secondary | ICD-10-CM | POA: Diagnosis not present

## 2019-10-13 LAB — POCT I-STAT 7, (LYTES, BLD GAS, ICA,H+H)
Acid-Base Excess: 6 mmol/L — ABNORMAL HIGH (ref 0.0–2.0)
Bicarbonate: 27.8 mmol/L (ref 20.0–28.0)
Calcium, Ion: 1.25 mmol/L (ref 1.15–1.40)
HCT: 42 % (ref 39.0–52.0)
Hemoglobin: 14.3 g/dL (ref 13.0–17.0)
O2 Saturation: 89 %
Potassium: 4.6 mmol/L (ref 3.5–5.1)
Sodium: 135 mmol/L (ref 135–145)
TCO2: 29 mmol/L (ref 22–32)
pCO2 arterial: 32 mmHg (ref 32.0–48.0)
pH, Arterial: 7.547 — ABNORMAL HIGH (ref 7.350–7.450)
pO2, Arterial: 49 mmHg — ABNORMAL LOW (ref 83.0–108.0)

## 2019-10-13 LAB — CBC
HCT: 48.1 % (ref 39.0–52.0)
Hemoglobin: 15.1 g/dL (ref 13.0–17.0)
MCH: 27.9 pg (ref 26.0–34.0)
MCHC: 31.4 g/dL (ref 30.0–36.0)
MCV: 88.9 fL (ref 80.0–100.0)
Platelets: 176 10*3/uL (ref 150–400)
RBC: 5.41 MIL/uL (ref 4.22–5.81)
RDW: 15 % (ref 11.5–15.5)
WBC: 13.9 10*3/uL — ABNORMAL HIGH (ref 4.0–10.5)
nRBC: 0 % (ref 0.0–0.2)

## 2019-10-13 LAB — COMPREHENSIVE METABOLIC PANEL
ALT: 16 U/L (ref 0–44)
AST: 19 U/L (ref 15–41)
Albumin: 2.4 g/dL — ABNORMAL LOW (ref 3.5–5.0)
Alkaline Phosphatase: 59 U/L (ref 38–126)
Anion gap: 13 (ref 5–15)
BUN: 27 mg/dL — ABNORMAL HIGH (ref 8–23)
CO2: 25 mmol/L (ref 22–32)
Calcium: 8.8 mg/dL — ABNORMAL LOW (ref 8.9–10.3)
Chloride: 99 mmol/L (ref 98–111)
Creatinine, Ser: 1.4 mg/dL — ABNORMAL HIGH (ref 0.61–1.24)
GFR calc Af Amer: >60 mL/min (ref >60)
GFR calc non Af Amer: 53 mL/min — ABNORMAL LOW (ref >60)
Glucose, Bld: 141 mg/dL — ABNORMAL HIGH (ref 70–99)
Potassium: 4.5 mmol/L (ref 3.5–5.1)
Sodium: 137 mmol/L (ref 135–145)
Total Bilirubin: 0.4 mg/dL (ref 0.3–1.2)
Total Protein: 7 g/dL (ref 6.5–8.1)

## 2019-10-13 LAB — AMMONIA: Ammonia: 39 umol/L — ABNORMAL HIGH (ref 9–35)

## 2019-10-13 MED ORDER — LORAZEPAM 2 MG/ML IJ SOLN
2.0000 mg | Freq: Once | INTRAMUSCULAR | Status: AC
  Administered 2019-10-13: 2 mg via INTRAVENOUS
  Filled 2019-10-13: qty 1

## 2019-10-13 MED ORDER — FUROSEMIDE 10 MG/ML IJ SOLN
40.0000 mg | Freq: Once | INTRAMUSCULAR | Status: AC
  Administered 2019-10-13: 17:00:00 40 mg via INTRAVENOUS
  Filled 2019-10-13: qty 4

## 2019-10-13 NOTE — Progress Notes (Signed)
Spoke with Pt son Ledarius Leeson. He had no questions after update.

## 2019-10-13 NOTE — Plan of Care (Signed)

## 2019-10-13 NOTE — Progress Notes (Signed)
Russell Carter  QMG:[id number] DOB: 1956/07/19 DOA: 09/23/2019 PCP: Marcine Matar, MD    Brief Narrative:  63 y.o.malewith medical history significant ofCAD, osteoarthritis, polysubstance abuse, chronic anticoagulation, diabetes, cocaine abuse, hyperlipidemia, cervical stenosis, anxiety disorder,hypertension, chronic kidney disease, seizure disorder, history of CVA, andbipolar disorderwho was recently admitted with COVID pneumonia, who returned with worsening symptoms.  He was discharged on 12/8.  He was no longer hypoxic.  His inflammatory markers have become normal.  Due to hypoxia patient was hospitalized for further management.  Significant Events: 12/11 admit via [facility] ED  COVID-19 specific Treatment: Remdesivir 12/12 > 12/16 Decadron 12/11 > 12/19  Subjective: The patient's mental status continues to vacillate and is proving quite challenging.  Repeat CT head today was accomplished to assure the patient has not developed a subacute subdural and was reassuringly normal in appearance.  The patient continues to have episodes in which he develops respiratory distress and sedation.  At the time of my exam today he will open his eyes and follow the examiner but will not answer my questions.  He is seen to spontaneously move all 4 extremities.  There are no obvious cranial nerve deficits though the patient will not cooperate with exam.  Assessment & Plan:  COVID Pneumonia -acute hypoxic respiratory failure Has completed a second course of remdesivir - Decadron discontinued out of fear it was contributing to his acute delirium - wean oxygen as able  Acute metabolic encephalopathy Etiology remains unclear - has a complex history of polysubstance abuse -no physiologic findings to suggest acute withdrawal -hepatic encephalopathy being treated without significant stabilization of mental status - BUN not severely elevated at this time - neurologic exam is nonfocal -unclear to me if  this is metabolic or psychiatric issue -no further episodes to suggest this is seizure activity -repeat CT head today nonacute  Fall without apparent injury Patient suffered an unwitnessed fall in his room 12/16 -physical exam post fall did not reveal any clear evidence of injury/trauma -CT scan head post fall was without acute findings, and repeat today remains the same    Acute renal failure No prior history of kidney disease -creatinine appears to be holding steady at 1.3-1.45  Right-sided upper abdominal discomfort This appears to have resolved  History of CAD Continue aspirin and statin  History of CVA Continue usual home medications  DM 2 - uncontrolled with hyperglycemia CBGs well controlled at this time - cont to monitor trend   Gout Continue allopurinol  Bipolar disorder Question the extent to which this is contributing to his current mental status fluctuations - prn haldol for agitation - sitter   Seizure disorder Continue Depakote and Vimpat - note Vimpat returned to his usual dose 12/18 w/ improved renal fxn -consider EEG 12/23 if mental status changes persist  HTN Blood pressure fluctuating - added clonidine 12/19 - cont to follow   BPH Continue Flomax  Chronic opioid use Med record shows patient on scheduled Percocet -  holding for now due to altered mental status  Prior history of alcohol abuse Unclear if patient was drinking prior to admit - should be beyond window for withdrawal at this time regardless   DVT prophylaxis: Eliquis Code Status: FULL CODE Family Communication:  Disposition Plan: Telemetry  Consultants:  none  Antimicrobials:  None  Objective: Blood pressure 125/76, pulse 98, temperature 97.7 F (36.5 C), temperature source Axillary, resp. rate 20, height 5\' 11"  (1.803 m), weight 102.9 kg, SpO2 90 %.  Intake/Output Summary (Last  24 hours) at 10/13/2019 0926 Last data filed at 10/12/2019 1803 Gross per 24 hour  Intake 800 ml    Output 100 ml  Net 700 ml   Filed Weights   10/03/19 1642 10/10/19 0351  Weight: 95.3 kg 102.9 kg    Examination: General: No acute respiratory distress at time of my visit Lungs: Fine scattered crackles bilaterally without change Cardiovascular: RRR without rub Abdomen: NT/ND, soft, BS+, no rebound Extremities: No C/C/E B LE  Neuro: no cranial nerve deficits - facies symetrical - moves all 4 ext spontaneously but does not follow commands  CBC: Recent Labs  Lab 10/07/19 0130 10/09/19 0500 10/10/19 0458 10/10/19 0748 10/11/19 0230  WBC 15.4* 14.3*  --  15.2* 20.9*  NEUTROABS 14.3*  --   --   --   --   HGB 13.6 15.3 15.3 15.2 14.8  HCT 42.5 47.2 45.0 46.9 46.7  MCV 87.1 87.1  --  87.5 87.6  PLT 203 226  --  250 220   Basic Metabolic Panel: Recent Labs  Lab 10/07/19 0130 10/10/19 0748 10/11/19 0230 10/12/19 0500  NA 141 143 146* 141  K 5.4* 5.4* 4.9 4.6  CL 108 108 111 101  CO2 24 26 25 27   GLUCOSE 87 138* 111* 81  BUN 47* 38* 39* 31*  CREATININE 1.37* 1.35* 1.29* 1.45*  CALCIUM 8.5* 8.8* 9.3 9.1  MG 2.8*  --   --   --    GFR: Estimated Creatinine Clearance: 62.8 mL/min (A) (by C-G formula based on SCr of 1.45 mg/dL (H)).  Liver Function Tests: Recent Labs  Lab 10/08/19 0500 10/09/19 0500 10/10/19 0748 10/12/19 0500  AST 17 16 16 24   ALT 12 12 14 19   ALKPHOS 49 48 40 56  BILITOT 0.3 0.8 0.2* 0.4  PROT 6.9 6.8 6.6 6.9  ALBUMIN 2.4* 2.6* 2.4* 2.6*    Recent Labs  Lab 10/07/19 0130 10/08/19 0500 10/09/19 0500 10/10/19 0748 10/11/19 0230  AMMONIA 56* 42* 34 38* 47*    HbA1C: HbA1c, POC (controlled diabetic range)  Date/Time Value Ref Range Status  06/06/2018 03:03 PM 5.7 0.0 - 7.0 % Final   Hgb A1c MFr Bld  Date/Time Value Ref Range Status  08/19/2019 05:41 PM 6.3 (H) 4.8 - 5.6 % Final    Comment:    (NOTE) Pre diabetes:          5.7%-6.4% Diabetes:              >6.4% Glycemic control for   <7.0% adults with diabetes   12/25/2018  08:01 AM 6.3 (H) 4.8 - 5.6 % Final    Comment:    (NOTE) Pre diabetes:          5.7%-6.4% Diabetes:              >6.4% Glycemic control for   <7.0% adults with diabetes     CBG: Recent Labs  Lab 10/10/19 1129 10/10/19 1231 10/10/19 1758 10/10/19 2015 10/11/19 0806  GLUCAP 173* 182* 158* 170* 84     Scheduled Meds: . allopurinol  300 mg Oral Daily  . apixaban  5 mg Oral BID  . aspirin EC  81 mg Oral Daily  . calcium-vitamin D  1 tablet Oral Daily  . Chlorhexidine Gluconate Cloth  6 each Topical Daily  . cloNIDine  0.1 mg Oral TID  . divalproex  1,000 mg Oral QHS  . divalproex  500 mg Oral q morning - 10a  . famotidine  20 mg Oral Daily  . folic acid  1 mg Oral Daily  . Ipratropium-Albuterol  1 puff Inhalation Q6H  . lacosamide  100 mg Oral BID  . lactulose  20 g Oral TID  . Magnesium Oxide  200 mg Oral QHS  . multivitamin with minerals  1 tablet Oral Daily  . sodium chloride flush  10-40 mL Intracatheter Q12H  . tamsulosin  0.4 mg Oral QHS  . thiamine  100 mg Oral Daily  . vitamin C  500 mg Oral Daily     LOS: 11 days   Cherene Altes, MD [facility] Office  [phone] Pager - Text Page per Amion  If 7PM-7AM, please contact night-coverage per Amion 10/13/2019, 9:26 AM

## 2019-10-14 DIAGNOSIS — R4182 Altered mental status, unspecified: Secondary | ICD-10-CM | POA: Diagnosis present

## 2019-10-14 DIAGNOSIS — E669 Obesity, unspecified: Secondary | ICD-10-CM | POA: Diagnosis not present

## 2019-10-14 DIAGNOSIS — N3000 Acute cystitis without hematuria: Secondary | ICD-10-CM | POA: Diagnosis not present

## 2019-10-14 DIAGNOSIS — J9601 Acute respiratory failure with hypoxia: Secondary | ICD-10-CM | POA: Diagnosis present

## 2019-10-14 DIAGNOSIS — U071 COVID-19: Secondary | ICD-10-CM | POA: Diagnosis not present

## 2019-10-14 DIAGNOSIS — I10 Essential (primary) hypertension: Secondary | ICD-10-CM | POA: Diagnosis not present

## 2019-10-14 DIAGNOSIS — R69 Illness, unspecified: Secondary | ICD-10-CM | POA: Diagnosis not present

## 2019-10-14 DIAGNOSIS — J1289 Other viral pneumonia: Secondary | ICD-10-CM | POA: Diagnosis not present

## 2019-10-14 LAB — COMPREHENSIVE METABOLIC PANEL
ALT: 18 U/L (ref 0–44)
AST: 20 U/L (ref 15–41)
Albumin: 2.5 g/dL — ABNORMAL LOW (ref 3.5–5.0)
Alkaline Phosphatase: 62 U/L (ref 38–126)
Anion gap: 11 (ref 5–15)
BUN: 33 mg/dL — ABNORMAL HIGH (ref 8–23)
CO2: 27 mmol/L (ref 22–32)
Calcium: 9.3 mg/dL (ref 8.9–10.3)
Chloride: 101 mmol/L (ref 98–111)
Creatinine, Ser: 1.44 mg/dL — ABNORMAL HIGH (ref 0.61–1.24)
GFR calc Af Amer: 59 mL/min — ABNORMAL LOW (ref >60)
GFR calc non Af Amer: 51 mL/min — ABNORMAL LOW (ref >60)
Glucose, Bld: 141 mg/dL — ABNORMAL HIGH (ref 70–99)
Potassium: 4.2 mmol/L (ref 3.5–5.1)
Sodium: 139 mmol/L (ref 135–145)
Total Bilirubin: 0.9 mg/dL (ref 0.3–1.2)
Total Protein: 7.3 g/dL (ref 6.5–8.1)

## 2019-10-14 LAB — CBC
HCT: 45.6 % (ref 39.0–52.0)
Hemoglobin: 14.5 g/dL (ref 13.0–17.0)
MCH: 28.4 pg (ref 26.0–34.0)
MCHC: 31.8 g/dL (ref 30.0–36.0)
MCV: 89.2 fL (ref 80.0–100.0)
Platelets: 165 10*3/uL (ref 150–400)
RBC: 5.11 MIL/uL (ref 4.22–5.81)
RDW: 15.1 % (ref 11.5–15.5)
WBC: 12.8 10*3/uL — ABNORMAL HIGH (ref 4.0–10.5)
nRBC: 0.2 % (ref 0.0–0.2)

## 2019-10-14 LAB — VALPROIC ACID LEVEL: Valproic Acid Lvl: 53 ug/mL (ref 50.0–100.0)

## 2019-10-14 LAB — AMMONIA: Ammonia: 28 umol/L (ref 9–35)

## 2019-10-14 MED ORDER — HALOPERIDOL LACTATE 5 MG/ML IJ SOLN
2.0000 mg | Freq: Once | INTRAMUSCULAR | Status: AC
  Administered 2019-10-14: 04:00:00 2 mg via INTRAVENOUS

## 2019-10-14 NOTE — Progress Notes (Signed)
Pt is restless and agitated. Pt asking to get up out of bed and has been threatening. Pt will answer simple questions. Pt screams out and calls out for people not in room. Pt did express to me that he has a HA. Gave tylenol and AM scheduled meds with no problems. Pt remains in BUE soft restraints and bilat mittens.Tele ST low 100s. Pt is tachypneic at times. No BM but lots of flatulence. Sats 89-93% on 4.5L [place].

## 2019-10-14 NOTE — Progress Notes (Signed)
PT Cancellation Note  Patient Details Name: Russell Carter MRN: [id number] DOB: 1956-03-07   Cancelled Treatment:    Reason Eval/Treat Not Completed: Other (comment). Spoke with RN who reports pt very agitated and recently received haldol, currently not appropriate/too lethargic to participate in PT treatment. Will follow-up as schedule permits.  Mabeline Caras, PT, DPT Acute [facility] Services  Pager [phone] Office [place] 10/14/2019, 4:02 PM

## 2019-10-14 NOTE — Progress Notes (Signed)
Pt had a very eventful night. Pt very anxious, agitated, and combative. Pt sitter at bedside. Low beds ordered waiting for arrival. Pt continuously yelling and attempting to climb out of bed despite reorientation attempts and 2 pt restraints. Haldol 2 mg given. MD made aware of pt condition and an EKG and another 2 mg of Haldol ordered. Pt continues to be extremely anxious, agitated and combative. Will continue to monitor.

## 2019-10-14 NOTE — Progress Notes (Signed)
Pt does cough intermittently with eating and drinking. Will inform attending.

## 2019-10-14 NOTE — Progress Notes (Signed)
Patient on soft wrist and mittens restraints, yet continues to practice unsafe, self harming behaviors. Patient continuously tries to get out of bed and is displaying aggressive behaviors with kicking of the feet. Patient screams, "help" non stop. Patient has a Actuary.Doctor notified with request of additional therapy.

## 2019-10-14 NOTE — Progress Notes (Signed)
Pt restless. Pt calling out and calling for his"daddy". Spoke with pts eldest son Russell Carter. Pt on 1L O2 via [place] and sats 88-94%. Pt desats with movement and screaming out. No distress.

## 2019-10-14 NOTE — Progress Notes (Signed)
Pt agitated. Pt screaming out.Pt attempting to crawl out of bed. Unable to redirect. Gave Haldol IV 2mg . Will f/u.

## 2019-10-14 NOTE — Plan of Care (Signed)
  Problem: Education: Goal: Knowledge of risk factors and measures for prevention of condition will improve Outcome: Not Progressing   Problem: Coping: Goal: Psychosocial and spiritual needs will be supported Outcome: Not Progressing   Problem: Respiratory: Goal: Complications related to the disease process, condition or treatment will be avoided or minimized Outcome: Not Progressing   Problem: Education: Goal: Knowledge of General Education information will improve Description: Including pain rating scale, medication(s)/side effects and non-pharmacologic comfort measures Outcome: Not Progressing   Problem: Health Behavior/Discharge Planning: Goal: Ability to manage health-related needs will improve Outcome: Not Progressing   Problem: Clinical Measurements: Goal: Ability to maintain clinical measurements within normal limits will improve Outcome: Not Progressing Goal: Will remain free from infection Outcome: Not Progressing Goal: Diagnostic test results will improve Outcome: Not Progressing Goal: Respiratory complications will improve Outcome: Not Progressing Goal: Cardiovascular complication will be avoided Outcome: Not Progressing   Problem: Activity: Goal: Risk for activity intolerance will decrease Outcome: Not Progressing   Problem: Nutrition: Goal: Adequate nutrition will be maintained Outcome: Not Progressing   Problem: Coping: Goal: Level of anxiety will decrease Outcome: Not Progressing   Problem: Elimination: Goal: Will not experience complications related to bowel motility Outcome: Not Progressing Goal: Will not experience complications related to urinary retention Outcome: Not Progressing   Problem: Pain Managment: Goal: General experience of comfort will improve Outcome: Not Progressing   Problem: Skin Integrity: Goal: Risk for impaired skin integrity will decrease Outcome: Not Progressing

## 2019-10-14 NOTE — Progress Notes (Signed)
Pt resting quietly in bed with eyes closed. NAD. Restraints remain in place. Skin intact and checked per protocol. Will continue to monitor.

## 2019-10-14 NOTE — Progress Notes (Signed)
Pt agitated. Pt screaming. Pt also constantly moving and trying to get out of bed. Restraints loosened to give active mobility to limbs. Pt remains on RA. No distress noted. RR 22-28.

## 2019-10-14 NOTE — Plan of Care (Signed)
  Problem: Respiratory: Goal: Will maintain a patent airway Outcome: Progressing   Problem: Safety: Goal: Ability to remain free from injury will improve Outcome: Progressing   

## 2019-10-14 NOTE — Progress Notes (Signed)
Pt resting quietly at this time. Pt awaken to voice stimulus. NAD. VSS. RA at this time and sats 92% Pt is tachypneic with RR 22-28.

## 2019-10-14 NOTE — Progress Notes (Signed)
PROGRESS NOTE  Russell Carter VEH:[id number] DOB: 08/25/1956 DOA: Oct 07, 2019 PCP: Marcine Matar, MD  HPI/Recap of past 36 hours: 63 year old male with past medical history of polysubstance abuse including alcohol and cocaine, CAD, hypertension, seizure disorder, obesity and bipolar disorder who was discharged on 12/8 after hospitalization of Covid pneumonia and at that time, no longer hypoxic, readmitted on 12/11 with hypoxia.  Patient brought back in treated for Covid this time with steroids and Remdisivir.  His hypoxia has since almost fully resolved.  However, the issue that persists is his altered mental status which continues to alternate between episodes of near unresponsiveness and severe agitation with work-up being unrevealing so far..   Assessment/Plan: Principal Problem:   Altered mental status:Patient's work-up has been extensive.  He is no longer hypoxic.  He is out of the window for alcohol withdrawal.  Electrolytes and renal function normal.  Neurological exam nonfocal.  No episodes to suggest seizure activity.  No evidence of hepatic encephalopathy.  Episodes persist.  Have consulted psychiatry.  Active Problems:   Essential hypertension: Blood pressure stable.   Polysubstance abuse (HCC)   CAD-50% LAD 2011   Cervical spinal stenosis  Bipolar disorder:?  Explains his symptoms.  Have started his nightly Seroquel and daily Cymbalta.  We will asked psychiatry to evaluate.    Benign prostatic hyperplasia (BPH) with straining on urination   Alcohol use: Out of the window for withdrawal.    Acute respiratory failure with hypoxia (HCC) secondary to COVID-19: Completed treatment course.  No longer hypoxic.  Malnutrition: We will ask nutrition to see.  Patient takes very little p.o. when he is in a less than responsive state  Seizure disorder: Continue Depakote and Vimpat. AKI: Resolved.  Code Status: Full code  Family Communication: Left message for son  Disposition  Plan: Discharge once mentation clear.   Consultants:  Psychiatry  Procedures:  None  Antimicrobials:  None  DVT prophylaxis: Eliquis   Objective: Vitals:   10/14/19 1459 10/14/19 1500  BP:  119/73  Pulse:  80  Resp: (!) 30 (!) 24  Temp:  97.7 F (36.5 C)  SpO2:  97%    Intake/Output Summary (Last 24 hours) at 10/14/2019 1550 Last data filed at 10/14/2019 0916 Gross per 24 hour  Intake 130 ml  Output 650 ml  Net -520 ml   Filed Weights   10/03/19 1642 10/10/19 0351  Weight: 95.3 kg 102.9 kg   Body mass index is 31.64 kg/m.  Exam:   General: Somnolent, does not really respond.  HEENT: Normocephalic atraumatic, mucous membranes are moist  Neck: Supple, no JVD  Cardiovascular: Regular rate and rhythm, S1-S2  Respiratory: Clear to auscultation bilaterally  Abdomen: Soft, nondistended, hypoactive bowel sounds  Musculoskeletal: No clubbing or cyanosis or edema  Skin: No skin breaks, tears or lesions  Psychiatry: I had episodes of alternating withdrawn state 2 episodes of severe agitation   Data Reviewed: CBC: Recent Labs  Lab 10/09/19 0500 10/10/19 0748 10/11/19 0230 10/13/19 0949 10/13/19 1728 10/14/19 0430  WBC 14.3* 15.2* 20.9* 13.9*  --  12.8*  HGB 15.3 15.2 14.8 15.1 14.3 14.5  HCT 47.2 46.9 46.7 48.1 42.0 45.6  MCV 87.1 87.5 87.6 88.9  --  89.2  PLT 226 250 220 176  --  165   Basic Metabolic Panel: Recent Labs  Lab 10/10/19 0748 10/11/19 0230 10/12/19 0500 10/13/19 0949 10/13/19 1728 10/14/19 0430  NA 143 146* 141 137 135 139  K 5.4* 4.9 4.6 4.5  4.6 4.2  CL 108 111 101 99  --  101  CO2 26 25 27 25   --  27  GLUCOSE 138* 111* 81 141*  --  141*  BUN 38* 39* 31* 27*  --  33*  CREATININE 1.35* 1.29* 1.45* 1.40*  --  1.44*  CALCIUM 8.8* 9.3 9.1 8.8*  --  9.3   GFR: Estimated Creatinine Clearance: 63.3 mL/min (A) (by C-G formula based on SCr of 1.44 mg/dL (H)). Liver Function Tests: Recent Labs  Lab 10/09/19 0500  10/10/19 0748 10/12/19 0500 10/13/19 0949 10/14/19 0430  AST 16 16 24 19 20   ALT 12 14 19 16 18   ALKPHOS 48 40 56 59 62  BILITOT 0.8 0.2* 0.4 0.4 0.9  PROT 6.8 6.6 6.9 7.0 7.3  ALBUMIN 2.6* 2.4* 2.6* 2.4* 2.5*   No results for input(s): LIPASE, AMYLASE in the last 168 hours. Recent Labs  Lab 10/09/19 0500 10/10/19 0748 10/11/19 0230 10/13/19 0949 10/14/19 0905  AMMONIA 34 38* 47* 39* 28   Coagulation Profile: No results for input(s): INR, PROTIME in the last 168 hours. Cardiac Enzymes: No results for input(s): CKTOTAL, CKMB, CKMBINDEX, TROPONINI in the last 168 hours. BNP (last 3 results) No results for input(s): PROBNP in the last 8760 hours. HbA1C: No results for input(s): HGBA1C in the last 72 hours. CBG: Recent Labs  Lab 10/10/19 1129 10/10/19 1231 10/10/19 1758 10/10/19 2015 10/11/19 0806  GLUCAP 173* 182* 158* 170* 84   Lipid Profile: No results for input(s): CHOL, HDL, LDLCALC, TRIG, CHOLHDL, LDLDIRECT in the last 72 hours. Thyroid Function Tests: Recent Labs    10/12/19 0500  TSH 2.246   Anemia Panel: No results for input(s): VITAMINB12, FOLATE, FERRITIN, TIBC, IRON, RETICCTPCT in the last 72 hours. Urine analysis:    Component Value Date/Time   COLORURINE YELLOW October 26, 2019 0915   APPEARANCEUR CLEAR 10/26/19 0915   LABSPEC 1.020 2019/10/26 0915   PHURINE 6.5 Oct 26, 2019 0915   GLUCOSEU NEGATIVE 10/26/19 0915   HGBUR TRACE (A) 10-26-2019 0915   BILIRUBINUR NEGATIVE 10/26/19 0915   BILIRUBINUR negative 09/08/2018 1640   KETONESUR 15 (A) 2019-10-26 0915   PROTEINUR TRACE (A) 10/26/19 0915   UROBILINOGEN 2.0 (A) 09/08/2018 1640   UROBILINOGEN 1.0 02/04/2015 0647   NITRITE NEGATIVE Oct 26, 2019 0915   LEUKOCYTESUR NEGATIVE 2019-10-26 0915   Sepsis Labs: @LABRCNTIP (procalcitonin:4,lacticidven:4)  )No results found for this or any previous visit (from the past 240 hour(s)).    Studies: No results found.  Scheduled Meds: . allopurinol   300 mg Oral Daily  . apixaban  5 mg Oral BID  . aspirin EC  81 mg Oral Daily  . calcium-vitamin D  1 tablet Oral Daily  . Chlorhexidine Gluconate Cloth  6 each Topical Daily  . cloNIDine  0.1 mg Oral TID  . divalproex  1,000 mg Oral QHS  . divalproex  500 mg Oral q morning - 10a  . famotidine  20 mg Oral Daily  . folic acid  1 mg Oral Daily  . Ipratropium-Albuterol  1 puff Inhalation Q6H  . lacosamide  100 mg Oral BID  . lactulose  20 g Oral TID  . Magnesium Oxide  200 mg Oral QHS  . multivitamin with minerals  1 tablet Oral Daily  . sodium chloride flush  10-40 mL Intracatheter Q12H  . tamsulosin  0.4 mg Oral QHS  . thiamine  100 mg Oral Daily  . vitamin C  500 mg Oral Daily    Continuous Infusions:  LOS: 12 days     Annita Brod, MD [facility]  To reach me or the doctor on call, go to: [email or web address] Password [email or web address]  10/14/2019, 3:50 PM

## 2019-10-15 LAB — POCT I-STAT 7, (LYTES, BLD GAS, ICA,H+H)
Acid-Base Excess: 5 mmol/L — ABNORMAL HIGH (ref 0.0–2.0)
Bicarbonate: 28.7 mmol/L — ABNORMAL HIGH (ref 20.0–28.0)
Calcium, Ion: 1.26 mmol/L (ref 1.15–1.40)
HCT: 42 % (ref 39.0–52.0)
Hemoglobin: 14.3 g/dL (ref 13.0–17.0)
O2 Saturation: 80 %
Patient temperature: 37.1
Potassium: 4.4 mmol/L (ref 3.5–5.1)
Sodium: 141 mmol/L (ref 135–145)
TCO2: 30 mmol/L (ref 22–32)
pCO2 arterial: 37.4 mmHg (ref 32.0–48.0)
pH, Arterial: 7.493 — ABNORMAL HIGH (ref 7.350–7.450)
pO2, Arterial: 41 mmHg — ABNORMAL LOW (ref 83.0–108.0)

## 2019-10-15 MED ORDER — NAPHAZOLINE-PHENIRAMINE 0.025-0.3 % OP SOLN
1.0000 [drp] | Freq: Four times a day (QID) | OPHTHALMIC | Status: DC | PRN
  Administered 2019-10-15 – 2019-10-24 (×2): 1 [drp] via OPHTHALMIC
  Filled 2019-10-15: qty 5

## 2019-10-15 MED ORDER — ZIPRASIDONE MESYLATE 20 MG IM SOLR
20.0000 mg | Freq: Once | INTRAMUSCULAR | Status: AC
  Administered 2019-10-15: 20 mg via INTRAMUSCULAR
  Filled 2019-10-15: qty 20

## 2019-10-15 NOTE — Progress Notes (Signed)
Pt more relaxed. Placed 2L O2 on pt for comfort. sats 100%. HR 100 and rr 38.  

## 2019-10-15 NOTE — Progress Notes (Signed)
PROGRESS NOTE  Levone Otten PYK:[id number] DOB: 1955/12/29 DOA: 10/04/2019 PCP: Marcine Matar, MD  HPI/Recap of past 24 hours: 63 year old male with past medical history of polysubstance abuse including alcohol and cocaine, CAD, hypertension, seizure disorder, obesity and bipolar disorder who was discharged on 12/8 after hospitalization of Covid pneumonia and at that time, no longer hypoxic, readmitted on 12/11 with hypoxia.  Patient brought back in treated for Covid this time with steroids and Remdisivir.  His hypoxia has since almost fully resolved.  However, the issue that persists is his altered mental status which continues to alternate between episodes of near unresponsiveness and severe agitation with work-up being unrevealing so far.  Psychiatry consulted, however they were unable to assess today given patient's sedation. Assessment/Plan: Principal Problem:   Altered mental status:Patient's work-up has been extensive.  He is no longer hypoxic.  He is out of the window for alcohol withdrawal.  Electrolytes and renal function normal.  Neurological exam nonfocal.  No episodes to suggest seizure activity.  No evidence of hepatic encephalopathy, as ammonia has remained stable.  Episodes persist.  Have consulted psychiatry.  Psychiatry attempted to see today, but with sedation from Haldol, patient more somnolent.  We will try to limit giving this so that they can get a better picture of him.  Active Problems:   Essential hypertension: Blood pressure stable.   Polysubstance abuse (HCC)   CAD-50% LAD 2011   Cervical spinal stenosis  Bipolar disorder:?  Explains his symptoms.  Have started his nightly Seroquel and daily Cymbalta.  We will asked psychiatry to evaluate.    Benign prostatic hyperplasia (BPH) with straining on urination   Alcohol use: Out of the window for withdrawal.    Acute respiratory failure with hypoxia (HCC) secondary to COVID-19: Completed treatment course.   Minimally hypoxic Malnutrition: We will ask nutrition to see.  Patient takes very little p.o. when he is in a less than responsive state  Seizure disorder: Continue Depakote and Vimpat. AKI: Resolved.  Code Status: Full code  Family Communication: Updated son by phone.  Disposition Plan: Discharge, hopefully mentation will clear soon   Consultants:  Psychiatry  Procedures:  None  Antimicrobials:  None  DVT prophylaxis: Eliquis   Objective: Vitals:   10/15/19 1000 10/15/19 1138  BP:  137/84  Pulse: (!) 115 (!) 106  Resp: (!) 22 (!) 22  Temp:    SpO2: 91% 95%    Intake/Output Summary (Last 24 hours) at 10/15/2019 1212 Last data filed at 10/15/2019 0912 Gross per 24 hour  Intake 140 ml  Output --  Net 140 ml   Filed Weights   10/03/19 1642 10/10/19 0351  Weight: 95.3 kg 102.9 kg   Body mass index is 31.64 kg/m.  Exam:   General: Calm, slightly more awake, limited responses  HEENT: Normocephalic atraumatic, mucous membranes are slightly dry  Neck: Supple, no JVD  Cardiovascular: Regular rate and rhythm, S1-S2  Respiratory: Clear to auscultation bilaterally  Abdomen: Soft, nondistended, hypoactive bowel sounds  Musculoskeletal: No clubbing or cyanosis or edema  Skin: No skin breaks, tears or lesions  Psychiatry: I had episodes of alternating withdrawn state 2 episodes of severe agitation   Data Reviewed: CBC: Recent Labs  Lab 10/09/19 0500 10/10/19 0748 10/11/19 0230 10/13/19 0949 10/13/19 1728 10/14/19 0430  WBC 14.3* 15.2* 20.9* 13.9*  --  12.8*  HGB 15.3 15.2 14.8 15.1 14.3 14.5  HCT 47.2 46.9 46.7 48.1 42.0 45.6  MCV 87.1 87.5 87.6 88.9  --  89.2  PLT 226 250 220 176  --  542   Basic Metabolic Panel: Recent Labs  Lab 10/10/19 0748 10/11/19 0230 10/12/19 0500 10/13/19 0949 10/13/19 1728 10/14/19 0430  NA 143 146* 141 137 135 139  K 5.4* 4.9 4.6 4.5 4.6 4.2  CL 108 111 101 99  --  101  CO2 26 25 27 25   --  27   GLUCOSE 138* 111* 81 141*  --  141*  BUN 38* 39* 31* 27*  --  33*  CREATININE 1.35* 1.29* 1.45* 1.40*  --  1.44*  CALCIUM 8.8* 9.3 9.1 8.8*  --  9.3   GFR: Estimated Creatinine Clearance: 63.3 mL/min (A) (by C-G formula based on SCr of 1.44 mg/dL (H)). Liver Function Tests: Recent Labs  Lab 10/09/19 0500 10/10/19 0748 10/12/19 0500 10/13/19 0949 10/14/19 0430  AST 16 16 24 19 20   ALT 12 14 19 16 18   ALKPHOS 48 40 56 59 62  BILITOT 0.8 0.2* 0.4 0.4 0.9  PROT 6.8 6.6 6.9 7.0 7.3  ALBUMIN 2.6* 2.4* 2.6* 2.4* 2.5*   No results for input(s): LIPASE, AMYLASE in the last 168 hours. Recent Labs  Lab 10/09/19 0500 10/10/19 0748 10/11/19 0230 10/13/19 0949 10/14/19 0905  AMMONIA 34 38* 47* 39* 28   Coagulation Profile: No results for input(s): INR, PROTIME in the last 168 hours. Cardiac Enzymes: No results for input(s): CKTOTAL, CKMB, CKMBINDEX, TROPONINI in the last 168 hours. BNP (last 3 results) No results for input(s): PROBNP in the last 8760 hours. HbA1C: No results for input(s): HGBA1C in the last 72 hours. CBG: Recent Labs  Lab 10/10/19 1129 10/10/19 1231 10/10/19 1758 10/10/19 2015 10/11/19 0806  GLUCAP 173* 182* 158* 170* 84   Lipid Profile: No results for input(s): CHOL, HDL, LDLCALC, TRIG, CHOLHDL, LDLDIRECT in the last 72 hours. Thyroid Function Tests: No results for input(s): TSH, T4TOTAL, FREET4, T3FREE, THYROIDAB in the last 72 hours. Anemia Panel: No results for input(s): VITAMINB12, FOLATE, FERRITIN, TIBC, IRON, RETICCTPCT in the last 72 hours. Urine analysis:    Component Value Date/Time   COLORURINE YELLOW 10/01/2019 0915   APPEARANCEUR CLEAR 09/28/2019 0915   LABSPEC 1.020 09/28/2019 0915   PHURINE 6.5 10/22/2019 0915   GLUCOSEU NEGATIVE 09/25/2019 0915   HGBUR TRACE (A) 09/24/2019 0915   BILIRUBINUR NEGATIVE 09/23/2019 0915   BILIRUBINUR negative 09/08/2018 1640   KETONESUR 15 (A) 10/21/2019 0915   PROTEINUR TRACE (A) 10/22/2019 0915    UROBILINOGEN 2.0 (A) 09/08/2018 1640   UROBILINOGEN 1.0 02/04/2015 0647   NITRITE NEGATIVE 10/19/2019 0915   LEUKOCYTESUR NEGATIVE 09/26/2019 0915   Sepsis Labs: @LABRCNTIP (procalcitonin:4,lacticidven:4)  )No results found for this or any previous visit (from the past 240 hour(s)).    Studies: No results found.  Scheduled Meds: . allopurinol  300 mg Oral Daily  . apixaban  5 mg Oral BID  . aspirin EC  81 mg Oral Daily  . calcium-vitamin D  1 tablet Oral Daily  . Chlorhexidine Gluconate Cloth  6 each Topical Daily  . cloNIDine  0.1 mg Oral TID  . divalproex  1,000 mg Oral QHS  . divalproex  500 mg Oral q morning - 10a  . famotidine  20 mg Oral Daily  . folic acid  1 mg Oral Daily  . Ipratropium-Albuterol  1 puff Inhalation Q6H  . lacosamide  100 mg Oral BID  . lactulose  20 g Oral TID  . Magnesium Oxide  200 mg Oral QHS  .  multivitamin with minerals  1 tablet Oral Daily  . sodium chloride flush  10-40 mL Intracatheter Q12H  . tamsulosin  0.4 mg Oral QHS  . thiamine  100 mg Oral Daily  . vitamin C  500 mg Oral Daily    Continuous Infusions:   LOS: 13 days     Hollice Espy, MD [facility]  To reach me or the doctor on call, go to: [email or web address] Password [email or web address]  10/15/2019, 12:12 PM

## 2019-10-15 NOTE — Progress Notes (Signed)
Dr. Vanita Ingles at bedside, viewing patients' behavior. New order placed.

## 2019-10-15 NOTE — Progress Notes (Signed)
Pt re-assessed

## 2019-10-15 NOTE — Progress Notes (Addendum)
Pt agitated since start of shift, flailing legs and kicking, attempting oob while restrained, and not calming to attempts at reorienting. PRN haldol given at shift change with no effect. Incontinent of bowel and bladder, unable to get urine sample at this time. Pt is too agitated to attempt I/o for urine. Paged and spoke w MD Vanita Ingles who wrote for geodon x1.Given and awaiting effects. Bed alarm on and fall mats down. Pt is tachypneic at baseline in the 30's.

## 2019-10-15 NOTE — Consult Note (Addendum)
  Attempted to complete psychiatry consult.  Patient appears sedated at this time.  Patient unable to verbally respond.  RN attempted to wake patient multiple times.  MD made aware, will attempt to assess patient tomorrow.  Attest to NP Note

## 2019-10-15 NOTE — Progress Notes (Signed)
Pt resting quietly at this time. No distress noted. Soft wrist restraints remain in place. Pt has had active and passive ROM every hour and as tolerated. No BM today.

## 2019-10-15 NOTE — Progress Notes (Signed)
Pt increasingly anxious. Pt fidgeting in bed. Restraints loosened for active ROM in extremities. Pt crying out. Will call MD.

## 2019-10-15 NOTE — Progress Notes (Signed)
Pt incontinent. Need UA per order. Per MD, ok to I/O cath for sample. Pt just now had large BM and incontinent urine. Pt clean and dry. Pt restless still and kicking legs.Will give report to PM nurse.

## 2019-10-15 NOTE — Progress Notes (Signed)
Physical Therapy Treatment Patient Details Name: Russell Carter MRN: [id number] DOB: December 30, 1955 Today's Date: 10/15/2019    History of Present Illness Pt is a 63 y.o. male recently admitted to [facility] four days with d/c home 09/29/19, now readmitted 10/15/2019 with worsening SOB and headache. PMH includes seizures, CAD, stroke, heart block, cervical stenosis s/p neck sx with subsequent L-side pain/weakness, CKD, DM2, substance abuse, chronic opiate use.    PT Comments    Pt tolerated tx well, was able to complete bed mob with mod a, sit unsupported EOB x approx 20 mins with cues. Pt seems to be in some pain or discomfort as he is moving around a lot, leaning head downwards, with cues able to correct posture and sit upright but only sits less than a minute then reverts to slumped posture again. Sit to stand with mod a and hand help assist, attempted to take some steps but pt would not take steps, he was also quite shaky in stance. Pt was initially on 4L/min via [place] and maintained sats in 90s, after sit to supine was noted to desat to low 70s but with probe change from finger to earlobe sats reading in 90s again.    Follow Up Recommendations  SNF     Equipment Recommendations  None recommended by PT    Recommendations for Other Services       Precautions / Restrictions Precautions Precautions: Fall Precaution Comments: cognition Restrictions Weight Bearing Restrictions: No    Mobility  Bed Mobility Overal bed mobility: Needs Assistance Bed Mobility: Supine to Sit;Sit to Supine     Supine to sit: Mod assist Sit to supine: Mod assist      Transfers Overall transfer level: Needs assistance Equipment used: 1 person hand held assist Transfers: Sit to/from Stand Sit to Stand: Mod assist            Ambulation/Gait             General Gait Details: attempted to take some steps but pt is too shaky and would not attempt steps   Stairs             Wheelchair  Mobility    Modified Rankin (Stroke Patients Only)       Balance     Sitting balance-Leahy Scale: Fair       Standing balance-Leahy Scale: Poor                              Cognition Arousal/Alertness: Lethargic Behavior During Therapy: Agitated;Restless Overall Cognitive Status: Impaired/Different from baseline                   Orientation Level: Disoriented to;Place;Time;Situation Current Attention Level: Alternating Memory: Decreased recall of precautions;Decreased short-term memory Following Commands: Follows one step commands inconsistently Safety/Judgement: Decreased awareness of safety;Decreased awareness of deficits   Problem Solving: Slow processing;Decreased initiation;Difficulty sequencing;Requires verbal cues;Requires tactile cues        Exercises      General Comments        Pertinent Vitals/Pain Pain Assessment: Faces Faces Pain Scale: Hurts a little bit Pain Location: general pain seems to be fidgeting around and rocking abck and forth when sitting edge of bed    Home Living                      Prior Function            PT Goals (current  goals can now be found in the care plan section) Acute Rehab PT Goals Patient Stated Goal: did not state any goals, was singing along to tv PT Goal Formulation: Patient unable to participate in goal setting Time For Goal Achievement: 10/17/19 Potential to Achieve Goals: Poor Progress towards PT goals: Not progressing toward goals - comment    Frequency    Min 3X/week      PT Plan Current plan remains appropriate    Co-evaluation              AM-PAC PT "6 Clicks" Mobility   Outcome Measure  Help needed turning from your back to your side while in a flat bed without using bedrails?: None Help needed moving from lying on your back to sitting on the side of a flat bed without using bedrails?: A Lot Help needed moving to and from a bed to a chair (including a  wheelchair)?: A Lot Help needed standing up from a chair using your arms (e.g., wheelchair or bedside chair)?: A Lot Help needed to walk in hospital room?: Total Help needed climbing 3-5 steps with a railing? : Total 6 Click Score: 12    End of Session Equipment Utilized During Treatment: Oxygen Activity Tolerance: Treatment limited secondary to medical complications (Comment);Patient limited by lethargy;Patient limited by fatigue;Patient limited by pain Patient left: with call bell/phone within reach;with nursing/sitter in room;in bed Nurse Communication: Mobility status(nurse in room most of tx session) PT Visit Diagnosis: Difficulty in walking, not elsewhere classified (R26.2);Other abnormalities of gait and mobility (R26.89)     Time: 8657-8469 PT Time Calculation (min) (ACUTE ONLY): 31 min  Charges:  $Therapeutic Activity: 23-37 mins                     Horald Chestnut, PT    Delford Field 10/15/2019, 3:58 PM

## 2019-10-15 NOTE — Progress Notes (Signed)
Pt ate magic cup and some peach cobbler for dinner. Pt c/o right eye burning/discomfort. Both of pt eyes are reddened.

## 2019-10-15 NOTE — Progress Notes (Signed)
Pt c/o arm pain. ROM performed and pt had some grimacing. Pt remains restless. Will follow simple commands and responds appropriately at times. Pt verbalized he is ready to eat breakfast. Pt is incontinent of bowel and bladder. Soft upper wrist restraints in place and mittens for pt safety. VSS.

## 2019-10-15 NOTE — Plan of Care (Signed)
  Problem: Respiratory: Goal: Will maintain a patent airway Outcome: Progressing   Problem: Education: Goal: Knowledge of risk factors and measures for prevention of condition will improve Outcome: Not Progressing   Problem: Coping: Goal: Psychosocial and spiritual needs will be supported Outcome: Not Progressing   Problem: Respiratory: Goal: Complications related to the disease process, condition or treatment will be avoided or minimized Outcome: Not Progressing   Problem: Education: Goal: Knowledge of General Education information will improve Description: Including pain rating scale, medication(s)/side effects and non-pharmacologic comfort measures Outcome: Not Progressing   Problem: Health Behavior/Discharge Planning: Goal: Ability to manage health-related needs will improve Outcome: Not Progressing   Problem: Clinical Measurements: Goal: Ability to maintain clinical measurements within normal limits will improve Outcome: Not Progressing Goal: Will remain free from infection Outcome: Not Progressing Goal: Diagnostic test results will improve Outcome: Not Progressing Goal: Respiratory complications will improve Outcome: Not Progressing Goal: Cardiovascular complication will be avoided Outcome: Not Progressing   Problem: Activity: Goal: Risk for activity intolerance will decrease Outcome: Not Progressing   Problem: Nutrition: Goal: Adequate nutrition will be maintained Outcome: Not Progressing   Problem: Coping: Goal: Level of anxiety will decrease Outcome: Not Progressing   Problem: Elimination: Goal: Will not experience complications related to bowel motility Outcome: Not Progressing Goal: Will not experience complications related to urinary retention Outcome: Not Progressing   Problem: Pain Managment: Goal: General experience of comfort will improve Outcome: Not Progressing   Problem: Safety: Goal: Ability to remain free from injury will  improve Outcome: Not Progressing   Problem: Skin Integrity: Goal: Risk for impaired skin integrity will decrease Outcome: Not Progressing

## 2019-10-15 NOTE — Progress Notes (Signed)
Pt's baseline is tachypnea

## 2019-10-15 NOTE — Progress Notes (Addendum)
Pt trying to get of bed. Pt complaining he wants to get up. Pt flaying legs over bedrails. Pt was able to communicate his need to use the urinal. Pt c/o painful urination, pt unable describe in details.

## 2019-10-15 NOTE — Progress Notes (Signed)
Pt resting quietly. Pt is still drowsy but arousable to voice commands. Pt is not eating food but will eat ice cream or pudding. Placed on 1L O2 via [place] for comfort. VSS. RR remains elevated b/w 26-32. No distress noted and MD aware.

## 2019-10-15 NOTE — Progress Notes (Signed)
ABG results given to Dr. [place] Pink. Pt increased from 1L to 4L. No new orders received at this time. RT will continue to monitor.

## 2019-10-15 NOTE — Progress Notes (Signed)
Pt given full bath, linens changed. Sat pt to chair for approx 67mins. Pt expressed he felt better sitting up. Pt has not been combative or agitated so far this shift. Pt has been cooperative. Pt is lethargic but can be aroused with verbal or touch stimulation. RA now and sats 96-97% Pt has remained ST low 100s since being up to chair and back to bed. Pt is tachypneic and rr 26-32. Advised MD [place] Pink.

## 2019-10-16 DIAGNOSIS — N390 Urinary tract infection, site not specified: Secondary | ICD-10-CM | POA: Diagnosis not present

## 2019-10-16 DIAGNOSIS — N3000 Acute cystitis without hematuria: Secondary | ICD-10-CM

## 2019-10-16 LAB — URINALYSIS, ROUTINE W REFLEX MICROSCOPIC
Bilirubin Urine: NEGATIVE
Glucose, UA: NEGATIVE mg/dL
Ketones, ur: 5 mg/dL — AB
Nitrite: POSITIVE — AB
Protein, ur: 100 mg/dL — AB
RBC / HPF: >50 RBC/hpf — ABNORMAL HIGH (ref 0–5)
Specific Gravity, Urine: 1.016 (ref 1.005–1.030)
WBC, UA: >50 WBC/hpf — ABNORMAL HIGH (ref 0–5)
pH: 6 (ref 5.0–8.0)

## 2019-10-16 LAB — COMPREHENSIVE METABOLIC PANEL
ALT: 19 U/L (ref 0–44)
AST: 19 U/L (ref 15–41)
Albumin: 2.6 g/dL — ABNORMAL LOW (ref 3.5–5.0)
Alkaline Phosphatase: 49 U/L (ref 38–126)
Anion gap: 12 (ref 5–15)
BUN: 27 mg/dL — ABNORMAL HIGH (ref 8–23)
CO2: 25 mmol/L (ref 22–32)
Calcium: 9.1 mg/dL (ref 8.9–10.3)
Chloride: 106 mmol/L (ref 98–111)
Creatinine, Ser: 1.1 mg/dL (ref 0.61–1.24)
GFR calc Af Amer: >60 mL/min (ref >60)
GFR calc non Af Amer: >60 mL/min (ref >60)
Glucose, Bld: 99 mg/dL (ref 70–99)
Potassium: 4.7 mmol/L (ref 3.5–5.1)
Sodium: 143 mmol/L (ref 135–145)
Total Bilirubin: 0.6 mg/dL (ref 0.3–1.2)
Total Protein: 7.5 g/dL (ref 6.5–8.1)

## 2019-10-16 LAB — CBC
HCT: 45.4 % (ref 39.0–52.0)
Hemoglobin: 14 g/dL (ref 13.0–17.0)
MCH: 27.3 pg (ref 26.0–34.0)
MCHC: 30.8 g/dL (ref 30.0–36.0)
MCV: 88.7 fL (ref 80.0–100.0)
Platelets: 195 10*3/uL (ref 150–400)
RBC: 5.12 MIL/uL (ref 4.22–5.81)
RDW: 15 % (ref 11.5–15.5)
WBC: 11.3 10*3/uL — ABNORMAL HIGH (ref 4.0–10.5)
nRBC: 0 % (ref 0.0–0.2)

## 2019-10-16 LAB — LACTIC ACID, PLASMA: Lactic Acid, Venous: 1.4 mmol/L (ref 0.5–1.9)

## 2019-10-16 MED ORDER — BENZTROPINE MESYLATE 1 MG/ML IJ SOLN
1.0000 mg | Freq: Two times a day (BID) | INTRAMUSCULAR | Status: DC
  Filled 2019-10-16 (×2): qty 1

## 2019-10-16 MED ORDER — HALOPERIDOL LACTATE 5 MG/ML IJ SOLN
5.0000 mg | Freq: Two times a day (BID) | INTRAMUSCULAR | Status: DC
  Administered 2019-10-16 – 2019-10-20 (×9): 5 mg via INTRAVENOUS
  Filled 2019-10-16 (×8): qty 1

## 2019-10-16 MED ORDER — LORAZEPAM 2 MG/ML IJ SOLN
1.0000 mg | Freq: Three times a day (TID) | INTRAMUSCULAR | Status: DC
  Administered 2019-10-16 – 2019-10-21 (×15): 1 mg via INTRAVENOUS
  Filled 2019-10-16 (×15): qty 1

## 2019-10-16 MED ORDER — BENZTROPINE MESYLATE 1 MG/ML IJ SOLN
1.0000 mg | Freq: Two times a day (BID) | INTRAMUSCULAR | Status: DC
  Administered 2019-10-17 – 2019-10-20 (×7): 1 mg via INTRAMUSCULAR
  Filled 2019-10-16 (×10): qty 1

## 2019-10-16 MED ORDER — SODIUM CHLORIDE 0.9 % IV SOLN
1.0000 g | INTRAVENOUS | Status: AC
  Administered 2019-10-16 – 2019-10-19 (×4): 1 g via INTRAVENOUS
  Filled 2019-10-16 (×5): qty 10

## 2019-10-16 NOTE — Progress Notes (Signed)
PROGRESS NOTE  Russell Carter VVY:[id number] DOB: 09-26-1956 DOA: 10/11/2019 PCP: Marcine Matar, MD  HPI/Recap of past 66 hours: 63 year old male with past medical history of polysubstance abuse including alcohol and cocaine, CAD, hypertension, seizure disorder, obesity and bipolar disorder who was discharged on 12/8 after hospitalization of Covid pneumonia and at that time, no longer hypoxic, readmitted on 12/11 with hypoxia.  Patient brought back in treated for Covid this time with steroids and Remdisivir.  His hypoxia has since almost fully resolved.  However, the issue that persists is his altered mental status which continues to alternate between episodes of near unresponsiveness and severe agitation with work-up being unrevealing so far.  Psychiatry consulted, however so far, they have been unable to assess because of his overt sedation.  Nursing did note episodes of urinary retention and in and out catheterization done and sent for urinalysis.  Started on IV Rocephin.   Assessment/Plan: Principal Problem:   Altered mental status:Patient's work-up has been extensive.  He is no longer hypoxic.  He is out of the window for alcohol withdrawal.  Electrolytes and renal function normal.  Neurological exam nonfocal.  No episodes to suggest seizure activity.  No evidence of hepatic encephalopathy, as ammonia has remained stable.  Episodes persist.  Possible this could be from infection so treating his urinary tract infection.  Psychiatry has not been able to properly assess him given his episodes of sedation, they have recommended a scheduled regimen of Ativan, Haldol and Cogentin which has been implemented.  Will check EKG daily since he is on Haldol monitoring for QT prolongation.  Ativan may be able to be converted to as needed if he improves.  Active Problems:   Essential hypertension: Blood pressure stable.   Polysubstance abuse (HCC)   CAD-50% LAD 2011   Cervical spinal stenosis  Bipolar  disorder:?  Explains his symptoms.  Have started his nightly Seroquel and daily Cymbalta.  We will asked psychiatry to evaluate.    Benign prostatic hyperplasia (BPH) with straining on urination: Likely underlying cause of his UTI.  Since he is not at a point where he can consistently take p.o., will restart Flomax once he is able.  Urinary tract infection: Have started IV Rocephin.  Cultures pending.    Alcohol use: Out of the window for withdrawal.    Acute respiratory failure with hypoxia (HCC) secondary to COVID-19: Completed treatment course.  Minimally hypoxic Malnutrition: We will ask nutrition to see.  Patient takes very little p.o. when he is in a less than responsive state  Seizure disorder: Continue Depakote and Vimpat. AKI: Resolved.  Code Status: Full code  Family Communication: Left message for son  Disposition Plan: Discharge, hopefully mentation will clear soon   Consultants:  Psychiatry  Procedures:  None  Antimicrobials:  IV Rocephin 12/25-present  DVT prophylaxis: Eliquis   Objective: Vitals:   10/16/19 1313 10/16/19 1314  BP:    Pulse:    Resp: (!) 30 (!) 21  Temp:    SpO2: 92%     Intake/Output Summary (Last 24 hours) at 10/16/2019 1444 Last data filed at 10/16/2019 0947 Gross per 24 hour  Intake 290 ml  Output 250 ml  Net 40 ml   Filed Weights   10/03/19 1642 10/10/19 0351  Weight: 95.3 kg 102.9 kg   Body mass index is 31.64 kg/m.  Exam:   General: Calm, slightly awake, does not really follow commands  HEENT: Normocephalic atraumatic, mucous membranes are slightly dry  Neck: Supple,  no JVD  Cardiovascular: Regular rate and rhythm, S1-S2  Respiratory: Clear to auscultation bilaterally  Abdomen: Soft, nondistended, hypoactive bowel sounds  Musculoskeletal: No clubbing or cyanosis or edema  Skin: No skin breaks, tears or lesions  Psychiatry: I had episodes of alternating withdrawn state to episodes of severe  agitation   Data Reviewed: CBC: Recent Labs  Lab 10/10/19 0748 10/11/19 0230 10/13/19 0949 10/13/19 1728 10/14/19 0430 10/15/19 1342 10/16/19 0300  WBC 15.2* 20.9* 13.9*  --  12.8*  --  11.3*  HGB 15.2 14.8 15.1 14.3 14.5 14.3 14.0  HCT 46.9 46.7 48.1 42.0 45.6 42.0 45.4  MCV 87.5 87.6 88.9  --  89.2  --  88.7  PLT 250 220 176  --  165  --  315   Basic Metabolic Panel: Recent Labs  Lab 10/11/19 0230 10/12/19 0500 10/13/19 0949 10/13/19 1728 10/14/19 0430 10/15/19 1342 10/16/19 0300  NA 146* 141 137 135 139 141 143  K 4.9 4.6 4.5 4.6 4.2 4.4 4.7  CL 111 101 99  --  101  --  106  CO2 25 27 25   --  27  --  25  GLUCOSE 111* 81 141*  --  141*  --  99  BUN 39* 31* 27*  --  33*  --  27*  CREATININE 1.29* 1.45* 1.40*  --  1.44*  --  1.10  CALCIUM 9.3 9.1 8.8*  --  9.3  --  9.1   GFR: Estimated Creatinine Clearance: 82.8 mL/min (by C-G formula based on SCr of 1.1 mg/dL). Liver Function Tests: Recent Labs  Lab 10/10/19 0748 10/12/19 0500 10/13/19 0949 10/14/19 0430 10/16/19 0300  AST 16 24 19 20 19   ALT 14 19 16 18 19   ALKPHOS 40 56 59 62 49  BILITOT 0.2* 0.4 0.4 0.9 0.6  PROT 6.6 6.9 7.0 7.3 7.5  ALBUMIN 2.4* 2.6* 2.4* 2.5* 2.6*   No results for input(s): LIPASE, AMYLASE in the last 168 hours. Recent Labs  Lab 10/10/19 0748 10/11/19 0230 10/13/19 0949 10/14/19 0905  AMMONIA 38* 47* 39* 28   Coagulation Profile: No results for input(s): INR, PROTIME in the last 168 hours. Cardiac Enzymes: No results for input(s): CKTOTAL, CKMB, CKMBINDEX, TROPONINI in the last 168 hours. BNP (last 3 results) No results for input(s): PROBNP in the last 8760 hours. HbA1C: No results for input(s): HGBA1C in the last 72 hours. CBG: Recent Labs  Lab 10/10/19 1129 10/10/19 1231 10/10/19 1758 10/10/19 2015 10/11/19 0806  GLUCAP 173* 182* 158* 170* 84   Lipid Profile: No results for input(s): CHOL, HDL, LDLCALC, TRIG, CHOLHDL, LDLDIRECT in the last 72 hours. Thyroid  Function Tests: No results for input(s): TSH, T4TOTAL, FREET4, T3FREE, THYROIDAB in the last 72 hours. Anemia Panel: No results for input(s): VITAMINB12, FOLATE, FERRITIN, TIBC, IRON, RETICCTPCT in the last 72 hours. Urine analysis:    Component Value Date/Time   COLORURINE AMBER (A) 10/15/2019 1809   APPEARANCEUR CLOUDY (A) 10/15/2019 1809   LABSPEC 1.016 10/15/2019 1809   PHURINE 6.0 10/15/2019 1809   GLUCOSEU NEGATIVE 10/15/2019 1809   HGBUR LARGE (A) 10/15/2019 1809   BILIRUBINUR NEGATIVE 10/15/2019 1809   BILIRUBINUR negative 09/08/2018 1640   KETONESUR 5 (A) 10/15/2019 1809   PROTEINUR 100 (A) 10/15/2019 1809   UROBILINOGEN 2.0 (A) 09/08/2018 1640   UROBILINOGEN 1.0 02/04/2015 0647   NITRITE POSITIVE (A) 10/15/2019 1809   LEUKOCYTESUR MODERATE (A) 10/15/2019 1809   Sepsis Labs: @LABRCNTIP (procalcitonin:4,lacticidven:4)  )No results found for  this or any previous visit (from the past 240 hour(s)).    Studies: No results found.  Scheduled Meds: . allopurinol  300 mg Oral Daily  . apixaban  5 mg Oral BID  . aspirin EC  81 mg Oral Daily  . benztropine mesylate  1 mg Intramuscular BID  . calcium-vitamin D  1 tablet Oral Daily  . Chlorhexidine Gluconate Cloth  6 each Topical Daily  . cloNIDine  0.1 mg Oral TID  . divalproex  1,000 mg Oral QHS  . divalproex  500 mg Oral q morning - 10a  . famotidine  20 mg Oral Daily  . folic acid  1 mg Oral Daily  . haloperidol lactate  5 mg Intravenous BID  . Ipratropium-Albuterol  1 puff Inhalation Q6H  . lacosamide  100 mg Oral BID  . lactulose  20 g Oral TID  . LORazepam  1 mg Intravenous TID  . Magnesium Oxide  200 mg Oral QHS  . multivitamin with minerals  1 tablet Oral Daily  . sodium chloride flush  10-40 mL Intracatheter Q12H  . tamsulosin  0.4 mg Oral QHS  . thiamine  100 mg Oral Daily  . vitamin C  500 mg Oral Daily    Continuous Infusions: . cefTRIAXone (ROCEPHIN)  IV Stopped (10/16/19 0947)     LOS: 14 days      Hollice Espy, MD [facility]  To reach me or the doctor on call, go to: [email or web address] Password [facility]  10/16/2019, 2:44 PM

## 2019-10-16 NOTE — Progress Notes (Signed)
Pt is drowsy and arousable with verbal command and intermittently. Pt was requesting alcoholic beverage to drink. PT unable to participate with Psychiatry team Otila Kluver.

## 2019-10-16 NOTE — Progress Notes (Signed)
Pt very agitated.Pt kicking and attempting to get out of bed. Pt screaming out for police and help. HR now 130s and rr 40s. Advised MD.

## 2019-10-16 NOTE — Consult Note (Addendum)
  Attempted to complete psychiatry consult.  Patient minimally responsive at this time.  Patient unable to participate in assessment.  RN attempted to wake patient multiple times. Patient only states "yeah, yeah, yeah," no meaningful conversation  MD made aware, will attempt to assess patient tomorrow.  Attest to NP Note

## 2019-10-16 NOTE — Plan of Care (Signed)
  Problem: Skin Integrity: Goal: Risk for impaired skin integrity will decrease Outcome: Progressing   Problem: Education: Goal: Knowledge of risk factors and measures for prevention of condition will improve Outcome: Not Progressing   Problem: Coping: Goal: Psychosocial and spiritual needs will be supported Outcome: Not Progressing   Problem: Respiratory: Goal: Will maintain a patent airway Outcome: Not Progressing Goal: Complications related to the disease process, condition or treatment will be avoided or minimized Outcome: Not Progressing   Problem: Education: Goal: Knowledge of General Education information will improve Description: Including pain rating scale, medication(s)/side effects and non-pharmacologic comfort measures Outcome: Not Progressing   Problem: Health Behavior/Discharge Planning: Goal: Ability to manage health-related needs will improve Outcome: Not Progressing   Problem: Clinical Measurements: Goal: Ability to maintain clinical measurements within normal limits will improve Outcome: Not Progressing Goal: Will remain free from infection Outcome: Not Progressing Goal: Diagnostic test results will improve Outcome: Not Progressing Goal: Respiratory complications will improve Outcome: Not Progressing Goal: Cardiovascular complication will be avoided Outcome: Not Progressing   Problem: Activity: Goal: Risk for activity intolerance will decrease Outcome: Not Progressing   Problem: Nutrition: Goal: Adequate nutrition will be maintained Outcome: Not Progressing   Problem: Coping: Goal: Level of anxiety will decrease Outcome: Not Progressing   Problem: Elimination: Goal: Will not experience complications related to bowel motility Outcome: Not Progressing Goal: Will not experience complications related to urinary retention Outcome: Not Progressing   Problem: Pain Managment: Goal: General experience of comfort will improve Outcome: Not  Progressing   Problem: Safety: Goal: Ability to remain free from injury will improve Outcome: Not Progressing

## 2019-10-16 NOTE — Progress Notes (Signed)
Pt has remained agitated, restless and combative during the majority of the night with intermittent screaming. PRN haldol has not been effective. IM geodon x1 gave some relief lasting approximately 4 hours. His current medication regiment is not working. Dr. Vanita Ingles notified. Pt was incontinent of bowel and bladder multiple times overnight. Sats and O2 requirements vary based on level of agitation.

## 2019-10-16 NOTE — Progress Notes (Signed)
Pt very restless. Pt kicking legs. Pt has been swearing and is asking for liquor/wine. HR elevated to 110s. Pt is on 3L O2 via [place] d/t agitation activity. Sitter at bedside. Pt remains in soft wrist restraints and mittens for safety. Condom cath placed. Pt is incontinent of B&B. PICC double lumen line to RUE patent and blood return noted. Will continue to monitor.

## 2019-10-16 NOTE — Progress Notes (Signed)
Reassessed. Baseline tachypnea and tachycardia

## 2019-10-17 LAB — VALPROIC ACID LEVEL: Valproic Acid Lvl: 49 ug/mL — ABNORMAL LOW (ref 50.0–100.0)

## 2019-10-17 MED ORDER — BACITRACIN-NEOMYCIN-POLYMYXIN OINTMENT TUBE
TOPICAL_OINTMENT | Freq: Two times a day (BID) | CUTANEOUS | Status: DC
  Administered 2019-10-18 – 2019-10-31 (×4): 1 via TOPICAL
  Filled 2019-10-17: qty 14.17

## 2019-10-17 MED ORDER — METOPROLOL TARTRATE 5 MG/5ML IV SOLN
5.0000 mg | Freq: Four times a day (QID) | INTRAVENOUS | Status: DC
  Administered 2019-10-17 – 2019-10-20 (×12): 5 mg via INTRAVENOUS
  Filled 2019-10-17 (×12): qty 5

## 2019-10-17 MED ORDER — NEOMYCIN-POLYMYXIN-PRAMOXINE 1 % EX CREA: TOPICAL_CREAM | Freq: Two times a day (BID) | CUTANEOUS | Status: DC

## 2019-10-17 NOTE — Progress Notes (Addendum)
PROGRESS NOTE  Russell Carter XIH:[id number] DOB: 04/27/1956 DOA: 2019-10-15 PCP: Marcine Matar, MD  HPI/Recap of past 29 hours: 63 year old male with past medical history of polysubstance abuse including alcohol and cocaine, CAD, hypertension, seizure disorder, obesity and bipolar disorder who was discharged on 12/8 after hospitalization of Covid pneumonia and at that time, no longer hypoxic, readmitted on 12/11 with hypoxia.  Patient brought back in treated for Covid this time with steroids and Remdisivir.  His hypoxia has since almost fully resolved.  However, the issue that persists is his altered mental status which continues to alternate between episodes of near unresponsiveness and severe agitation with work-up being unrevealing so far.  Psychiatry consulted, however so far, they have been unable to assess because of his overt sedation.  Very did make a medication regimen recommendation which has been implemented.  On 12/25, nursing did note episodes of urinary retention and in and out catheterization done and sent for urinalysis.  Started on IV Rocephin.  Not much overnight.  Patient still at times has agitation.  Remains tachycardic and at times oxygen saturations drop requiring few liters of oxygen.  Otherwise, remains confused with minimal responsiveness   Assessment/Plan: Principal Problem:   Altered mental status:Patient's work-up has been extensive.  He is no longer hypoxic.  He is out of the window for alcohol withdrawal.  Electrolytes and renal function normal.  Neurological exam nonfocal.  No episodes to suggest seizure activity.  No evidence of hepatic encephalopathy, as ammonia has remained stable.  Episodes persist.  Could be from urinary tract infection.  Has been on 2 days of IV Rocephin, cultures pending.  Psychiatry has not been able to properly assess him given his episodes of sedation, they have recommended a scheduled regimen of Ativan, Haldol and Cogentin which has been  implemented as of 12/25 evening.  Will check EKG daily since he is on Haldol monitoring for QT prolongation.  Ativan may be able to be converted to as needed if he improves.  Not much change today.  Active Problems:   Essential hypertension: Blood pressure stable.  Given tachycardia, have started Lopressor   Polysubstance abuse (HCC)   CAD-50% LAD 2011   Cervical spinal stenosis  Bipolar disorder:?  Explains his symptoms.  Have started his nightly Seroquel and daily Cymbalta.  Psychiatry following and currently on medication regimen.  When he is more awake, they can better assess    Benign prostatic hyperplasia (BPH) with straining on urination: Likely underlying cause of his UTI.  Since he is not at a point where he can consistently take p.o., will restart Flomax once he is able.  Urinary tract infection: Have started IV Rocephin.  Cultures pending.    Alcohol use: Out of the window for withdrawal.    Acute respiratory failure with hypoxia (HCC) secondary to COVID-19: Completed treatment course.  Minimally hypoxic Malnutrition: We will ask nutrition to see.  Patient takes very little p.o. when he is in a less than responsive state  Seizure disorder: Continue Depakote and Vimpat.  Depakote level on the low end of normal. AKI: Resolved.  Code Status: Full code  Family Communication: Updated son by phone  Disposition Plan: Discharge location TBD, hopefully mentation will clear soon   Consultants:  Psychiatry  Procedures:  None  Antimicrobials:  IV Rocephin 12/25-present  DVT prophylaxis: Eliquis   Objective: Vitals:   10/17/19 1200 10/17/19 1250  BP: 96/75   Pulse:  84  Resp: (!) 37 (!) 33  Temp:  SpO2:  100%    Intake/Output Summary (Last 24 hours) at 10/17/2019 1534 Last data filed at 10/17/2019 1500 Gross per 24 hour  Intake 1090.65 ml  Output 250 ml  Net 840.65 ml   Filed Weights   10/03/19 1642 10/10/19 0351  Weight: 95.3 kg 102.9 kg   Body mass  index is 31.64 kg/m.  Exam:   General: Still somnolent vs episodes of agitation  HEENT: Normocephalic atraumatic, mucous membranes are slightly dry  Neck: Supple, no JVD  Cardiovascular: Regular rate and rhythm, S1-S2  Respiratory: Clear to auscultation bilaterally  Abdomen: Soft, nondistended, hypoactive bowel sounds  Musculoskeletal: No clubbing or cyanosis or edema  Skin: No skin breaks, tears or lesions  Psychiatry: episodes of alternating withdrawn state to episodes of severe agitation   Data Reviewed: CBC: Recent Labs  Lab 10/11/19 0230 10/13/19 0949 10/13/19 1728 10/14/19 0430 10/15/19 1342 10/16/19 0300  WBC 20.9* 13.9*  --  12.8*  --  11.3*  HGB 14.8 15.1 14.3 14.5 14.3 14.0  HCT 46.7 48.1 42.0 45.6 42.0 45.4  MCV 87.6 88.9  --  89.2  --  88.7  PLT 220 176  --  165  --  979   Basic Metabolic Panel: Recent Labs  Lab 10/11/19 0230 10/12/19 0500 10/13/19 0949 10/13/19 1728 10/14/19 0430 10/15/19 1342 10/16/19 0300  NA 146* 141 137 135 139 141 143  K 4.9 4.6 4.5 4.6 4.2 4.4 4.7  CL 111 101 99  --  101  --  106  CO2 25 27 25   --  27  --  25  GLUCOSE 111* 81 141*  --  141*  --  99  BUN 39* 31* 27*  --  33*  --  27*  CREATININE 1.29* 1.45* 1.40*  --  1.44*  --  1.10  CALCIUM 9.3 9.1 8.8*  --  9.3  --  9.1   GFR: Estimated Creatinine Clearance: 82.8 mL/min (by C-G formula based on SCr of 1.1 mg/dL). Liver Function Tests: Recent Labs  Lab 10/12/19 0500 10/13/19 0949 10/14/19 0430 10/16/19 0300  AST 24 19 20 19   ALT 19 16 18 19   ALKPHOS 56 59 62 49  BILITOT 0.4 0.4 0.9 0.6  PROT 6.9 7.0 7.3 7.5  ALBUMIN 2.6* 2.4* 2.5* 2.6*   No results for input(s): LIPASE, AMYLASE in the last 168 hours. Recent Labs  Lab 10/11/19 0230 10/13/19 0949 10/14/19 0905  AMMONIA 47* 39* 28   Coagulation Profile: No results for input(s): INR, PROTIME in the last 168 hours. Cardiac Enzymes: No results for input(s): CKTOTAL, CKMB, CKMBINDEX, TROPONINI in the  last 168 hours. BNP (last 3 results) No results for input(s): PROBNP in the last 8760 hours. HbA1C: No results for input(s): HGBA1C in the last 72 hours. CBG: Recent Labs  Lab 10/10/19 1758 10/10/19 2015 10/11/19 0806  GLUCAP 158* 170* 84   Lipid Profile: No results for input(s): CHOL, HDL, LDLCALC, TRIG, CHOLHDL, LDLDIRECT in the last 72 hours. Thyroid Function Tests: No results for input(s): TSH, T4TOTAL, FREET4, T3FREE, THYROIDAB in the last 72 hours. Anemia Panel: No results for input(s): VITAMINB12, FOLATE, FERRITIN, TIBC, IRON, RETICCTPCT in the last 72 hours. Urine analysis:    Component Value Date/Time   COLORURINE AMBER (A) 10/15/2019 1809   APPEARANCEUR CLOUDY (A) 10/15/2019 1809   LABSPEC 1.016 10/15/2019 1809   PHURINE 6.0 10/15/2019 1809   GLUCOSEU NEGATIVE 10/15/2019 1809   HGBUR LARGE (A) 10/15/2019 1809   BILIRUBINUR NEGATIVE 10/15/2019 1809  BILIRUBINUR negative 09/08/2018 1640   KETONESUR 5 (A) 10/15/2019 1809   PROTEINUR 100 (A) 10/15/2019 1809   UROBILINOGEN 2.0 (A) 09/08/2018 1640   UROBILINOGEN 1.0 02/04/2015 0647   NITRITE POSITIVE (A) 10/15/2019 1809   LEUKOCYTESUR MODERATE (A) 10/15/2019 1809   Sepsis Labs: @LABRCNTIP (procalcitonin:4,lacticidven:4)  )No results found for this or any previous visit (from the past 240 hour(s)).    Studies: No results found.  Scheduled Meds: . allopurinol  300 mg Oral Daily  . apixaban  5 mg Oral BID  . aspirin EC  81 mg Oral Daily  . benztropine mesylate  1 mg Intramuscular Q12H  . calcium-vitamin D  1 tablet Oral Daily  . Chlorhexidine Gluconate Cloth  6 each Topical Daily  . cloNIDine  0.1 mg Oral TID  . divalproex  1,000 mg Oral QHS  . divalproex  500 mg Oral q morning - 10a  . famotidine  20 mg Oral Daily  . folic acid  1 mg Oral Daily  . haloperidol lactate  5 mg Intravenous BID  . Ipratropium-Albuterol  1 puff Inhalation Q6H  . lacosamide  100 mg Oral BID  . lactulose  20 g Oral TID  .  LORazepam  1 mg Intravenous TID  . Magnesium Oxide  200 mg Oral QHS  . metoprolol tartrate  5 mg Intravenous Q6H  . multivitamin with minerals  1 tablet Oral Daily  . sodium chloride flush  10-40 mL Intracatheter Q12H  . tamsulosin  0.4 mg Oral QHS  . thiamine  100 mg Oral Daily  . vitamin C  500 mg Oral Daily    Continuous Infusions: . cefTRIAXone (ROCEPHIN)  IV Stopped (10/17/19 10/19/19)     LOS: 15 days     [id number], MD [facility]  To reach me or the doctor on call, go to: [email or web address] Password [facility]  10/17/2019, 3:34 PM

## 2019-10-18 LAB — URINE CULTURE: Culture: <10000 — AB

## 2019-10-18 LAB — URINALYSIS, ROUTINE W REFLEX MICROSCOPIC
Bilirubin Urine: NEGATIVE
Glucose, UA: NEGATIVE mg/dL
Ketones, ur: 5 mg/dL — AB
Nitrite: NEGATIVE
Protein, ur: 100 mg/dL — AB
RBC / HPF: >50 RBC/hpf — ABNORMAL HIGH (ref 0–5)
Specific Gravity, Urine: 1.023 (ref 1.005–1.030)
WBC, UA: >50 WBC/hpf — ABNORMAL HIGH (ref 0–5)
pH: 5 (ref 5.0–8.0)

## 2019-10-18 NOTE — Plan of Care (Signed)
Patient continues to require bilateral wrist restraints r/t agitation/restlessness/confusion/pulling lines/tubes. Sitter at bedside. Bath given/full linen change after incontinent of urine; condom catheter now in place. Urinalysis/urine culture sent to lab. 2L [place].  Problem: Education: Goal: Knowledge of risk factors and measures for prevention of condition will improve Outcome: Progressing   Problem: Coping: Goal: Psychosocial and spiritual needs will be supported Outcome: Progressing   Problem: Respiratory: Goal: Will maintain a patent airway Outcome: Progressing Goal: Complications related to the disease process, condition or treatment will be avoided or minimized Outcome: Progressing   Problem: Education: Goal: Knowledge of General Education information will improve Description: Including pain rating scale, medication(s)/side effects and non-pharmacologic comfort measures Outcome: Progressing   Problem: Health Behavior/Discharge Planning: Goal: Ability to manage health-related needs will improve Outcome: Progressing   Problem: Clinical Measurements: Goal: Ability to maintain clinical measurements within normal limits will improve Outcome: Progressing Goal: Will remain free from infection Outcome: Progressing Goal: Diagnostic test results will improve Outcome: Progressing Goal: Respiratory complications will improve Outcome: Progressing Goal: Cardiovascular complication will be avoided Outcome: Progressing   Problem: Activity: Goal: Risk for activity intolerance will decrease Outcome: Progressing   Problem: Coping: Goal: Level of anxiety will decrease Outcome: Progressing   Problem: Elimination: Goal: Will not experience complications related to bowel motility Outcome: Progressing Goal: Will not experience complications related to urinary retention Outcome: Progressing   Problem: Pain Managment: Goal: General experience of comfort will improve Outcome:  Progressing   Problem: Safety: Goal: Ability to remain free from injury will improve Outcome: Progressing   Problem: Skin Integrity: Goal: Risk for impaired skin integrity will decrease Outcome: Progressing   Problem: Nutrition: Goal: Adequate nutrition will be maintained Outcome: Not Progressing

## 2019-10-18 NOTE — Progress Notes (Signed)
PROGRESS NOTE  Russell Carter NFA:[id number] DOB: 22-Jun-1956 DOA: 10/19/2019 PCP: Marcine Matar, MD  HPI/Recap of past 30 hours: 63 year old male with past medical history of polysubstance abuse including alcohol and cocaine, CAD, hypertension, seizure disorder, obesity and bipolar disorder who was discharged on 12/8 after hospitalization of Covid pneumonia and at that time, no longer hypoxic, readmitted on 12/11 with hypoxia.  Patient brought back in treated for Covid this time with steroids and Remdisivir.  His hypoxia has since almost fully resolved.  However, the issue that persists is his altered mental status which continues to alternate between episodes of near unresponsiveness and severe agitation with work-up being unrevealing so far.  Psychiatry consulted, however so far, they have been unable to assess because of his overt sedation.  Very did make a medication regimen recommendation which has been implemented.  On 12/25, nursing did note episodes of urinary retention and in and out catheterization done and sent for urinalysis.  Started on IV Rocephin.  No changes overnight.  Patient still at times has agitation.  Remains tachycardic and at times oxygen saturations drop requiring few liters of oxygen.  Otherwise, remains confused with minimal responsiveness.    Assessment/Plan: Principal Problem:   Altered mental status:Patient's work-up has been extensive.  He is no longer hypoxic.  He is out of the window for alcohol withdrawal.  Electrolytes and renal function normal.  Neurological exam nonfocal.  No episodes to suggest seizure activity.  No evidence of hepatic encephalopathy, as ammonia has remained stable.  Episodes persist.  Could be from urinary tract infection.  Has been on 2 days of IV Rocephin, cultures pending.  Psychiatry has not been able to properly assess him given his episodes of sedation, they have recommended a scheduled regimen of Ativan, Haldol and Cogentin which has  been implemented as of 12/25 evening.  Will check EKG daily since he is on Haldol monitoring for QT prolongation.  Ativan may be able to be converted to as needed if he improves.  Not much change today.  Active Problems:   Essential hypertension: Blood pressure stable.  Given tachycardia, have started Lopressor   Polysubstance abuse (HCC)   CAD-50% LAD 2011   Cervical spinal stenosis  Bipolar disorder:?  Explains his symptoms.  Restarted his nightly Seroquel and daily Cymbalta.  Psychiatry following and currently on medication regimen.  When he is more awake, they can better assess    Benign prostatic hyperplasia (BPH) with straining on urination: Likely underlying cause of his UTI.  Since he is not at a point where he can consistently take p.o., will restart Flomax once he is able.  Urinary tract infection: Have started IV Rocephin.  Cultures noting no growth to date.  We will repeat his urinalysis and check culture again.    Alcohol use: Out of the window for withdrawal.    Acute respiratory failure with hypoxia (HCC) secondary to COVID-19: Completed treatment course.  Minimally hypoxic Malnutrition: We will ask nutrition to see.  Patient takes very little p.o. when he is in a less than responsive state  Seizure disorder: Continue Depakote and Vimpat.  Depakote level on the low end of normal. AKI: Resolved.  Code Status: Full code  Family Communication: Updated son by phone  Disposition Plan: Discharge location TBD, hopefully mentation will clear up   Consultants:  Psychiatry  Procedures:  None  Antimicrobials:  IV Rocephin 12/25-present  DVT prophylaxis: Eliquis   Objective: Vitals:   10/18/19 1500 10/18/19 1600  BP:  114/81  Pulse:  88  Resp:  (!) 24  Temp:    SpO2: 93% 96%    Intake/Output Summary (Last 24 hours) at 10/18/2019 1638 Last data filed at 10/18/2019 1300 Gross per 24 hour  Intake 10 ml  Output 70 ml  Net -60 ml   Filed Weights   10/03/19  1642 10/10/19 0351  Weight: 95.3 kg 102.9 kg   Body mass index is 31.64 kg/m.  Exam:   General: Still somnolent vs episodes of agitation  HEENT: Normocephalic atraumatic, mucous membranes are slightly dry  Neck: Supple, no JVD  Cardiovascular: Regular rate and rhythm, S1-S2  Respiratory: Clear to auscultation bilaterally  Abdomen: Soft, nondistended, hypoactive bowel sounds  Musculoskeletal: No clubbing or cyanosis or edema  Skin: No skin breaks, tears or lesions  Psychiatry: episodes of alternating withdrawn state to episodes of severe agitation   Data Reviewed: CBC: Recent Labs  Lab 10/13/19 0949 10/13/19 1728 10/14/19 0430 10/15/19 1342 10/16/19 0300  WBC 13.9*  --  12.8*  --  11.3*  HGB 15.1 14.3 14.5 14.3 14.0  HCT 48.1 42.0 45.6 42.0 45.4  MCV 88.9  --  89.2  --  88.7  PLT 176  --  165  --  195   Basic Metabolic Panel: Recent Labs  Lab 10/12/19 0500 10/13/19 0949 10/13/19 1728 10/14/19 0430 10/15/19 1342 10/16/19 0300  NA 141 137 135 139 141 143  K 4.6 4.5 4.6 4.2 4.4 4.7  CL 101 99  --  101  --  106  CO2 27 25  --  27  --  25  GLUCOSE 81 141*  --  141*  --  99  BUN 31* 27*  --  33*  --  27*  CREATININE 1.45* 1.40*  --  1.44*  --  1.10  CALCIUM 9.1 8.8*  --  9.3  --  9.1   GFR: Estimated Creatinine Clearance: 82.8 mL/min (by C-G formula based on SCr of 1.1 mg/dL). Liver Function Tests: Recent Labs  Lab 10/12/19 0500 10/13/19 0949 10/14/19 0430 10/16/19 0300  AST 24 19 20 19   ALT 19 16 18 19   ALKPHOS 56 59 62 49  BILITOT 0.4 0.4 0.9 0.6  PROT 6.9 7.0 7.3 7.5  ALBUMIN 2.6* 2.4* 2.5* 2.6*   No results for input(s): LIPASE, AMYLASE in the last 168 hours. Recent Labs  Lab 10/13/19 0949 10/14/19 0905  AMMONIA 39* 28   Coagulation Profile: No results for input(s): INR, PROTIME in the last 168 hours. Cardiac Enzymes: No results for input(s): CKTOTAL, CKMB, CKMBINDEX, TROPONINI in the last 168 hours. BNP (last 3 results) No results  for input(s): PROBNP in the last 8760 hours. HbA1C: No results for input(s): HGBA1C in the last 72 hours. CBG: No results for input(s): GLUCAP in the last 168 hours. Lipid Profile: No results for input(s): CHOL, HDL, LDLCALC, TRIG, CHOLHDL, LDLDIRECT in the last 72 hours. Thyroid Function Tests: No results for input(s): TSH, T4TOTAL, FREET4, T3FREE, THYROIDAB in the last 72 hours. Anemia Panel: No results for input(s): VITAMINB12, FOLATE, FERRITIN, TIBC, IRON, RETICCTPCT in the last 72 hours. Urine analysis:    Component Value Date/Time   COLORURINE AMBER (A) 10/15/2019 1809   APPEARANCEUR CLOUDY (A) 10/15/2019 1809   LABSPEC 1.016 10/15/2019 1809   PHURINE 6.0 10/15/2019 1809   GLUCOSEU NEGATIVE 10/15/2019 1809   HGBUR LARGE (A) 10/15/2019 1809   BILIRUBINUR NEGATIVE 10/15/2019 1809   BILIRUBINUR negative 09/08/2018 1640   KETONESUR 5 (A) 10/15/2019  1809   PROTEINUR 100 (A) 10/15/2019 1809   UROBILINOGEN 2.0 (A) 09/08/2018 1640   UROBILINOGEN 1.0 02/04/2015 0647   NITRITE POSITIVE (A) 10/15/2019 1809   LEUKOCYTESUR MODERATE (A) 10/15/2019 1809   Sepsis Labs: @LABRCNTIP (procalcitonin:4,lacticidven:4)  ) Recent Results (from the past 240 hour(s))  Culture, Urine     Status: Abnormal   Collection Time: 10/17/19  1:20 PM   Specimen: Urine, Catheterized  Result Value Ref Range Status   Specimen Description   Final    URINE, CATHETERIZED Performed at [facility], [place] [address], [place], [place] [address]    Special Requests   Final    NONE Performed at [facility], [place] [address], [place], [place] [address]    Culture (A)  Final    <10,000 COLONIES/mL INSIGNIFICANT GROWTH Performed at [place] [address], [place], [place] [address]    Report Status 10/18/2019 FINAL  Final      Studies: No results found.  Scheduled Meds: . allopurinol  300 mg Oral Daily  . apixaban  5 mg Oral BID  . aspirin EC  81 mg Oral  Daily  . benztropine mesylate  1 mg Intramuscular Q12H  . calcium-vitamin D  1 tablet Oral Daily  . Chlorhexidine Gluconate Cloth  6 each Topical Daily  . cloNIDine  0.1 mg Oral TID  . divalproex  1,000 mg Oral QHS  . divalproex  500 mg Oral q morning - 10a  . famotidine  20 mg Oral Daily  . folic acid  1 mg Oral Daily  . haloperidol lactate  5 mg Intravenous BID  . Ipratropium-Albuterol  1 puff Inhalation Q6H  . lacosamide  100 mg Oral BID  . lactulose  20 g Oral TID  . LORazepam  1 mg Intravenous TID  . Magnesium Oxide  200 mg Oral QHS  . metoprolol tartrate  5 mg Intravenous Q6H  . multivitamin with minerals  1 tablet Oral Daily  . neomycin-bacitracin-polymyxin   Topical BID  . sodium chloride flush  10-40 mL Intracatheter Q12H  . tamsulosin  0.4 mg Oral QHS  . thiamine  100 mg Oral Daily  . vitamin C  500 mg Oral Daily    Continuous Infusions: . cefTRIAXone (ROCEPHIN)  IV 1 g (10/18/19 0827)     LOS: 16 days     Annita Brod, MD [facility]  To reach me or the doctor on call, go to: [email or web address] Password [email or web address]  10/18/2019, 4:38 PM

## 2019-10-19 DIAGNOSIS — N390 Urinary tract infection, site not specified: Secondary | ICD-10-CM | POA: Diagnosis not present

## 2019-10-19 DIAGNOSIS — U071 COVID-19: Secondary | ICD-10-CM | POA: Diagnosis not present

## 2019-10-19 DIAGNOSIS — G92 Toxic encephalopathy: Secondary | ICD-10-CM | POA: Diagnosis not present

## 2019-10-19 DIAGNOSIS — E870 Hyperosmolality and hypernatremia: Secondary | ICD-10-CM | POA: Diagnosis not present

## 2019-10-19 DIAGNOSIS — E440 Moderate protein-calorie malnutrition: Secondary | ICD-10-CM | POA: Diagnosis not present

## 2019-10-19 DIAGNOSIS — I69354 Hemiplegia and hemiparesis following cerebral infarction affecting left non-dominant side: Secondary | ICD-10-CM | POA: Diagnosis not present

## 2019-10-19 DIAGNOSIS — J690 Pneumonitis due to inhalation of food and vomit: Secondary | ICD-10-CM | POA: Diagnosis not present

## 2019-10-19 DIAGNOSIS — J9601 Acute respiratory failure with hypoxia: Secondary | ICD-10-CM | POA: Diagnosis not present

## 2019-10-19 DIAGNOSIS — R6521 Severe sepsis with septic shock: Secondary | ICD-10-CM | POA: Diagnosis not present

## 2019-10-19 DIAGNOSIS — A419 Sepsis, unspecified organism: Secondary | ICD-10-CM | POA: Diagnosis not present

## 2019-10-19 LAB — CBC
HCT: 43.1 % (ref 39.0–52.0)
Hemoglobin: 12.9 g/dL — ABNORMAL LOW (ref 13.0–17.0)
MCH: 27.9 pg (ref 26.0–34.0)
MCHC: 29.9 g/dL — ABNORMAL LOW (ref 30.0–36.0)
MCV: 93.1 fL (ref 80.0–100.0)
Platelets: 236 10*3/uL (ref 150–400)
RBC: 4.63 MIL/uL (ref 4.22–5.81)
RDW: 15.9 % — ABNORMAL HIGH (ref 11.5–15.5)
WBC: 9.1 10*3/uL (ref 4.0–10.5)
nRBC: 0.2 % (ref 0.0–0.2)

## 2019-10-19 LAB — URINE CULTURE: Culture: NO GROWTH

## 2019-10-19 MED ORDER — LEVETIRACETAM IN NACL 500 MG/100ML IV SOLN
500.0000 mg | Freq: Two times a day (BID) | INTRAVENOUS | Status: DC
  Administered 2019-10-19 – 2019-10-20 (×2): 500 mg via INTRAVENOUS
  Filled 2019-10-19 (×4): qty 100

## 2019-10-19 MED ORDER — ENOXAPARIN SODIUM 100 MG/ML ~~LOC~~ SOLN
100.0000 mg | Freq: Two times a day (BID) | SUBCUTANEOUS | Status: DC
  Administered 2019-10-19 – 2019-10-23 (×8): 100 mg via SUBCUTANEOUS
  Filled 2019-10-19 (×9): qty 1

## 2019-10-19 MED ORDER — QUETIAPINE FUMARATE 25 MG PO TABS: 50.0000 mg | ORAL_TABLET | Freq: Every day | ORAL | Status: DC

## 2019-10-19 MED ORDER — ACETAMINOPHEN 650 MG RE SUPP: 650.0000 mg | RECTAL | Status: DC | PRN

## 2019-10-19 MED ORDER — PANTOPRAZOLE SODIUM 40 MG IV SOLR
40.0000 mg | Freq: Every day | INTRAVENOUS | Status: DC
  Administered 2019-10-19 – 2019-10-20 (×2): 40 mg via INTRAVENOUS
  Filled 2019-10-19 (×2): qty 40

## 2019-10-19 MED ORDER — QUETIAPINE FUMARATE 25 MG PO TABS
25.0000 mg | ORAL_TABLET | Freq: Two times a day (BID) | ORAL | Status: DC
  Administered 2019-10-20: 16:00:00 25 mg via ORAL
  Filled 2019-10-19: qty 1

## 2019-10-19 MED ORDER — ENOXAPARIN SODIUM 100 MG/ML ~~LOC~~ SOLN
100.0000 mg | Freq: Two times a day (BID) | SUBCUTANEOUS | Status: DC
  Filled 2019-10-19 (×2): qty 1

## 2019-10-19 NOTE — Progress Notes (Signed)
Russell Carter  IWL:[id number] DOB: 01-02-1956 DOA: 09/22/2019 PCP: Ladell Pier, MD    Brief Narrative:  63 y.o.malewith medical history significant ofCAD, osteoarthritis, polysubstance abuse, chronic anticoagulation, diabetes, cocaine abuse, hyperlipidemia, cervical stenosis, anxiety disorder,hypertension, chronic kidney disease, seizure disorder, history of CVA, andbipolar disorderwho was recently admitted with COVID pneumonia, who returned with worsening symptoms.  He was discharged on 12/8.  He was no longer hypoxic.  His inflammatory markers have become normal.  Due to hypoxia patient was hospitalized for further management.  Significant Events: 12/11 admit via [facility]  COVID-19 specific Treatment: Remdesivir 12/12 > 12/16 Decadron 12/11 > 12/19  Subjective: No significant change in mental status from when I saw him approximately 5 to 6 days ago.  Does not appear to be in acute distress.  Assessment & Plan:  COVID Pneumonia -acute hypoxic respiratory failure Has completed a second course of remdesivir - Decadron discontinued out of fear it was contributing to his acute delirium - wean oxygen as able (is a mouth breather)  Acute metabolic encephalopathy -altered mental status Work-up has been extensive -being treated for possible UTI which could be contributing but otherwise etiology remains unclear - Psychiatry has evaluated and recommends a scheduled regimen of Ativan, Haldol, and Cogentin which is being administered - has a complex history of polysubstance abuse -no physiologic findings to suggest acute withdrawal - hepatic encephalopathy being treated without significant stabilization of mental status - BUN not severely elevated at this time - neurologic exam is nonfocal -unclear to me if this is metabolic or psychiatric issue -no further episodes to suggest this is seizure activity - serial CT head nonacute  Fall without apparent injury Patient suffered an  unwitnessed fall in his room 12/16 -physical exam post fall did not reveal any clear evidence of injury/trauma -CT scan head post fall was without acute findings, and repeat today remains the same    Acute renal failure No prior history of kidney disease -creatinine steady/improved   History of CAD Continue aspirin and statin  History of CVA Continue usual home medications  DM 2 - uncontrolled with hyperglycemia CBGs well controlled    Gout Continue allopurinol  Bipolar disorder Question the extent to which this is contributing to his current mental status fluctuations - meds per Psychiatry   Seizure disorder Continue Depakote and Vimpat - note Vimpat returned to his usual dose 12/18 w/ improved renal fxn -consider EEG 12/23 if mental status changes persist  HTN Blood pressure fluctuating -overall is appropriately controlled at present  BPH Continue Flomax  Chronic opioid use holding narcoitcs due to altered mental status  Prior history of alcohol abuse Unclear if patient was drinking prior to admit - should be beyond window for withdrawal at this time regardless   DVT prophylaxis: Eliquis Code Status: FULL CODE Family Communication:  Disposition Plan: tele   Consultants:  none  Antimicrobials:  None  Objective: Blood pressure (!) 154/93, pulse (!) 101, temperature 99.2 F (37.3 C), temperature source Axillary, resp. rate (!) 23, height 5\' 11"  (1.803 m), weight 102.9 kg, SpO2 (!) 77 %.  Intake/Output Summary (Last 24 hours) at 10/19/2019 0906 Last data filed at 10/19/2019 0816 Gross per 24 hour  Intake 0 ml  Output 770 ml  Net -770 ml   Filed Weights   10/03/19 1642 10/10/19 0351  Weight: 95.3 kg 102.9 kg    Examination: General: No acute respiratory distress  Lungs: Good air movement throughout Cardiovascular: RRR  Abdomen: NT/ND, soft, BS+,  no rebound Extremities: No C/C/E B LE  Neuro: no cranial nerve deficits - facies symetrical - moves all 4  ext spontaneously   CBC: Recent Labs  Lab 10/14/19 0430 10/15/19 1342 10/16/19 0300 10/19/19 0425  WBC 12.8*  --  11.3* 9.1  HGB 14.5 14.3 14.0 12.9*  HCT 45.6 42.0 45.4 43.1  MCV 89.2  --  88.7 93.1  PLT 165  --  195 236   Basic Metabolic Panel: Recent Labs  Lab 10/13/19 0949 10/14/19 0430 10/15/19 1342 10/16/19 0300  NA 137 139 141 143  K 4.5 4.2 4.4 4.7  CL 99 101  --  106  CO2 25 27  --  25  GLUCOSE 141* 141*  --  99  BUN 27* 33*  --  27*  CREATININE 1.40* 1.44*  --  1.10  CALCIUM 8.8* 9.3  --  9.1   GFR: Estimated Creatinine Clearance: 82.8 mL/min (by C-G formula based on SCr of 1.1 mg/dL).  Liver Function Tests: Recent Labs  Lab 10/13/19 0949 10/14/19 0430 10/16/19 0300  AST 19 20 19   ALT 16 18 19   ALKPHOS 59 62 49  BILITOT 0.4 0.9 0.6  PROT 7.0 7.3 7.5  ALBUMIN 2.4* 2.5* 2.6*    Recent Labs  Lab 10/13/19 0949 10/14/19 0905  AMMONIA 39* 28    HbA1C: HbA1c, POC (controlled diabetic range)  Date/Time Value Ref Range Status  06/06/2018 03:03 PM 5.7 0.0 - 7.0 % Final   Hgb A1c MFr Bld  Date/Time Value Ref Range Status  08/19/2019 05:41 PM 6.3 (H) 4.8 - 5.6 % Final    Comment:    (NOTE) Pre diabetes:          5.7%-6.4% Diabetes:              >6.4% Glycemic control for   <7.0% adults with diabetes   12/25/2018 08:01 AM 6.3 (H) 4.8 - 5.6 % Final    Comment:    (NOTE) Pre diabetes:          5.7%-6.4% Diabetes:              >6.4% Glycemic control for   <7.0% adults with diabetes     Scheduled Meds: . allopurinol  300 mg Oral Daily  . apixaban  5 mg Oral BID  . aspirin EC  81 mg Oral Daily  . benztropine mesylate  1 mg Intramuscular Q12H  . calcium-vitamin D  1 tablet Oral Daily  . Chlorhexidine Gluconate Cloth  6 each Topical Daily  . cloNIDine  0.1 mg Oral TID  . divalproex  1,000 mg Oral QHS  . divalproex  500 mg Oral q morning - 10a  . famotidine  20 mg Oral Daily  . folic acid  1 mg Oral Daily  . haloperidol lactate  5 mg  Intravenous BID  . Ipratropium-Albuterol  1 puff Inhalation Q6H  . lacosamide  100 mg Oral BID  . lactulose  20 g Oral TID  . LORazepam  1 mg Intravenous TID  . Magnesium Oxide  200 mg Oral QHS  . metoprolol tartrate  5 mg Intravenous Q6H  . multivitamin with minerals  1 tablet Oral Daily  . neomycin-bacitracin-polymyxin   Topical BID  . sodium chloride flush  10-40 mL Intracatheter Q12H  . tamsulosin  0.4 mg Oral QHS  . thiamine  100 mg Oral Daily  . vitamin C  500 mg Oral Daily     LOS: 17 days   Tinnie Gens T.  Sharon Seller, MD [facility] Office  [phone] Pager - Text Page per Amion  If 7PM-7AM, please contact night-coverage per Amion 10/19/2019, 9:06 AM

## 2019-10-19 NOTE — Progress Notes (Addendum)
Informed MD McClung at 1220 that pt is mouth breathing and desating to 85-87%, Roland increased to 6L to maintain SPO2 >92%.  MD relplied at 1225 that intervention was appropriate and okay to place on NRB to maintain SPO2 for mouth breathing.  10/19/2019 [place], RN

## 2019-10-19 NOTE — Progress Notes (Signed)
ANTICOAGULATION CONSULT NOTE - Initial Consult  Pharmacy Consult for Lovenox while Apixaban on hold Indication: History of PE  Allergies  Allergen Reactions  . Shellfish Allergy Anaphylaxis    Patient Measurements: Height: 5\' 11"  (180.3 cm) Weight: 226 lb 13.7 oz (102.9 kg) IBW/kg (Calculated) : 75.3  Vital Signs: Temp: 98.6 F (37 C) (12/28 1603) Temp Source: Oral (12/28 1603) BP: 122/88 (12/28 1603) Pulse Rate: 92 (12/28 1603)  Labs: Recent Labs    10/19/19 0425  HGB 12.9*  HCT 43.1  PLT 236    Estimated Creatinine Clearance: 82.8 mL/min (by C-G formula based on SCr of 1.1 mg/dL).   Medical History: Past Medical History:  Diagnosis Date  . Acute kidney injury ([place]) 04/27/2018  . AKI (acute kidney injury) ([place])   . Anginal pain ([place])   . Anxiety   . Arthritis   . Bipolar disorder ([place])   . Blood in stool   . CAD (coronary artery disease)    a. 2011: cath showing mod non-obstructive disease b. 02/2016: cath with 95% stenosis in the Mid RCA. DES placed.  . Cervical stenosis of spine   . Chronic anticoagulation 04/27/2018  . Chronic pain disorder 06/05/2017  . CKD (chronic kidney disease), stage III 05/06/2018  . CKD (chronic kidney disease), stage III 05/06/2018  . Cocaine abuse ([place])    history  . Complete heart block ([place]) 03/14/2016  . Depression   . Diabetes mellitus type 2 in nonobese (HCC)   . Diabetes mellitus without complication ([place])   . Dyslipidemia 03/15/2016  . Essential hypertension 05/18/2009   Qualifier: Diagnosis of  By: Burnett Kanaris    . Gastroesophageal reflux disease without esophagitis 07/31/2017  . Gout, unspecified   . Headache   . History of CVA (cerebrovascular accident)   . History of pulmonary embolus (PE) 02/14/2017  . HTN (hypertension)   . Hypercholesterolemia   . Hyperglycemia   . Leukopenia 05/06/2018  . Migraines   . Myocardial infarction [facility]) 2008   drug cocaine and marijuana at time of mi none  since    . Normocytic  anemia 05/06/2018  . Pneumonia   . Polysubstance abuse ([place]) 01/15/2013  . Rectal bleeding   . Renal insufficiency   . Seizure ([place]) 05/06/2018  . Seizures ([place])   . Stroke ([place])   . Vertigo     Medications:  Scheduled:  . benztropine mesylate  1 mg Intramuscular Q12H  . Chlorhexidine Gluconate Cloth  6 each Topical Daily  . divalproex  1,000 mg Oral QHS  . haloperidol lactate  5 mg Intravenous BID  . Ipratropium-Albuterol  1 puff Inhalation Q6H  . lacosamide  100 mg Oral BID  . LORazepam  1 mg Intravenous TID  . metoprolol tartrate  5 mg Intravenous Q6H  . neomycin-bacitracin-polymyxin   Topical BID  . pantoprazole (PROTONIX) IV  40 mg Intravenous QHS  . QUEtiapine  25 mg Oral BID  . QUEtiapine  50 mg Oral QHS  . sodium chloride flush  10-40 mL Intracatheter Q12H  . tamsulosin  0.4 mg Oral QHS   Infusions:  . cefTRIAXone (ROCEPHIN)  IV Stopped (10/19/19 1035)  . levETIRAcetam     PRN: acetaminophen, haloperidol lactate, naphazoline-pheniramine, nitroGLYCERIN, [DISCONTINUED] ondansetron **OR** ondansetron (ZOFRAN) IV, senna-docusate, sodium chloride flush  Assessment: 63 yo male recently admitted 12/4-12/8 for COVID, readmitted 12/11 with hypoxia.  On apixaban for history of PE, now unable to take PO meds due to encephalopathy.  Pharmacy consulted to dose Lovenox.  Goal of Therapy:  Anti-Xa level 0.6-1 units/ml 4hrs after LMWH dose given Monitor platelets by anticoagulation protocol: Yes   Plan:   Lovenox 1mg /kg SQ q12h  Monitor renal function, CBC, signs/symptoms of bleeding  , PharmD, BCPS Pharmacy: [phone] 10/19/2019,6:17 PM

## 2019-10-19 NOTE — Progress Notes (Signed)
MEWS  Patient had a MEWS change from green to yellow, focused assessment done, frequent vitals, charge nurse informed, on call physician was informed

## 2019-10-19 NOTE — Plan of Care (Signed)
Updated pt on POC, pt unable to verbalize understanding d/t mental status.

## 2019-10-19 NOTE — Progress Notes (Signed)
Message sent to MD McClung at 1128 about pt not passing speech eval and unable to take any PO medications.  MD reply at 1205 that it was noted. 10/19/2019 [place], RN

## 2019-10-19 NOTE — Progress Notes (Signed)
Agitation recommendation:  Seroquel 25 mg at 8 am and 2 pm, 50 mg at bedtime.  Hold if patient is sedated.  This will assist with agitation and mood stabilization.  Waylan Boga, PMHNP

## 2019-10-19 NOTE — Evaluation (Signed)
Clinical/Bedside Swallow Evaluation Patient Details  Name: Russell Carter MRN: [id number] Date of Birth: 23-Feb-1956  Today's Date: 10/19/2019 Time: SLP Start Time (ACUTE ONLY): 1007 SLP Stop Time (ACUTE ONLY): 1028 SLP Time Calculation (min) (ACUTE ONLY): 21 min  Past Medical History:  Past Medical History:  Diagnosis Date  . Acute kidney injury (HCC) 04/27/2018  . AKI (acute kidney injury) (HCC)   . Anginal pain (HCC)   . Anxiety   . Arthritis   . Bipolar disorder (HCC)   . Blood in stool   . CAD (coronary artery disease)    a. 2011: cath showing mod non-obstructive disease b. 02/2016: cath with 95% stenosis in the Mid RCA. DES placed.  . Cervical stenosis of spine   . Chronic anticoagulation 04/27/2018  . Chronic pain disorder 06/05/2017  . CKD (chronic kidney disease), stage III 05/06/2018  . CKD (chronic kidney disease), stage III 05/06/2018  . Cocaine abuse (HCC)    history  . Complete heart block (HCC) 03/14/2016  . Depression   . Diabetes mellitus type 2 in nonobese (HCC)   . Diabetes mellitus without complication (HCC)   . Dyslipidemia 03/15/2016  . Essential hypertension 05/18/2009   Qualifier: Diagnosis of  By: Kem Parkinson    . Gastroesophageal reflux disease without esophagitis 07/31/2017  . Gout, unspecified   . Headache   . History of CVA (cerebrovascular accident)   . History of pulmonary embolus (PE) 02/14/2017  . HTN (hypertension)   . Hypercholesterolemia   . Hyperglycemia   . Leukopenia 05/06/2018  . Migraines   . Myocardial infarction [facility]) 2008   drug cocaine and marijuana at time of mi none  since    . Normocytic anemia 05/06/2018  . Pneumonia   . Polysubstance abuse (HCC) 01/15/2013  . Rectal bleeding   . Renal insufficiency   . Seizure (HCC) 05/06/2018  . Seizures (HCC)   . Stroke (HCC)   . Vertigo    Past Surgical History:  Past Surgical History:  Procedure Laterality Date  . ANTERIOR CERVICAL DECOMP/DISCECTOMY FUSION N/A 01/24/2017   Procedure:  Cervical Four-Five,Cervical Five Six,Cervical Six-Seven Anterior cervical discectomy with fusion and plate with Cervical Three-Thoracic- One Laminectomy/Foraminotomy with Cervical Three-Thoracic- One Fixation and fusion;  Surgeon: Loura Halt Ditty, MD;  Location: [facility] OR;  Service: Neurosurgery;  Laterality: N/A;  Part-1 anterior approach  . CARDIAC CATHETERIZATION  2008; ~ 2011   [place]-40%lad  . CARDIAC CATHETERIZATION N/A 03/16/2016   Procedure: Left Heart Cath and Coronary Angiography;  Surgeon: Marykay Lex, MD;  Location: [facility] INVASIVE CV LAB;  Service: Cardiovascular;  Laterality: N/A;  . COLONOSCOPY WITH PROPOFOL N/A 04/17/2018   Procedure: COLONOSCOPY WITH PROPOFOL;  Surgeon: Graylin Shiver, MD;  Location: [facility] ENDOSCOPY;  Service: Endoscopy;  Laterality: N/A;  . CORONARY ANGIOPLASTY     STENTS  . EYE SURGERY Bilateral    "for double vision"  . INGUINAL HERNIA REPAIR  05/12/2012   Procedure: HERNIA REPAIR INGUINAL ADULT;  Surgeon: Liz Malady, MD;  Location: [facility];  Service: General;  Laterality: Right;  . INGUINAL HERNIA REPAIR Left 02/02/2003   Dr. Violeta Gelinas. repair with mesh. Same procedure done again in  05/22/2004   . INGUINAL HERNIA REPAIR Left ?date 2nd OR  . LEFT HEART CATH AND CORONARY ANGIOGRAPHY N/A 04/29/2018   Procedure: LEFT HEART CATH AND CORONARY ANGIOGRAPHY;  Surgeon: [place], Peter M, MD;  Location: [facility] INVASIVE CV LAB;  Service: Cardiovascular;  Laterality: N/A;  . POSTERIOR CERVICAL  FUSION/FORAMINOTOMY N/A 01/24/2017   Procedure: Cervical Three-Thoracic One-Laminectomy/Foraminotomy with Cervical Three-Thoracic One Fixation and fusion;  Surgeon: Kevan Ny Ditty, MD;  Location: [facility];  Service: Neurosurgery;  Laterality: N/A;  Part-2 Posterior approach  . RECONSTRUCT / STABILIZE DISTAL ULNA    . SHOULDER OPEN ROTATOR CUFF REPAIR Left 2010  . SHOULDER SURGERY Left    "injured on the job; had to cut small bone out"  . THUMB FUSION Right     HPI:  Pt is a 63 y.o. male recently admitted to [facility] four days with d/c home 09/29/19, now readmitted 2019-10-05 with worsening SOB and headache. PMH includes seizures, CAD, stroke, GERD, heart block, cervical stenosis s/p neck sx with subsequent L-side pain/weakness, CKD, DM2, substance abuse, chronic opiate use. On po's until recently and per chart he has had fluctating altered mental status which continues to alternate between near unresponsiveness and severe agitation with work-up being unrevealing so far. and psychiatry consulted, however so far, they have been unable to assess because of his overt sedation. Made NPO and swallow eval ordered.    Assessment / Plan / Recommendation Clinical Impression  Mr. Stiff exhibits cognitive based dysphagia with compounding factors including lethargy, decreased awareness, xerostomia/bleeding and respiratory compromise. He was eating until past several days, made NPO today due to lethargy and decr O2 sats. He was still having po medication however. Pt given sedation earlier but able to follow simple commands with repetition. No communicative intent but moaned with lips/palate cleaned. RN tech cleaned lips this am and likely removed scabs as therapist noted blood on lower/upper lip and dentition. Most session spent cleaning oral cavity. Trials of ice chip, 1/2 tsp water and one bite applesauce resulted in subjective observations of reduced laryngeal elevation, multiple swallows and immdediate throat clears and delayed coughs. Recommend he receive alternate means nutrition preferably Cortrak. ST will continue to follow focusig on po trials with goals of increased alertness, timelier swallow with reduced indications of airway compromise.    SLP Visit Diagnosis: Dysphagia, unspecified (R13.10)    Aspiration Risk  Severe aspiration risk;Moderate aspiration risk    Diet Recommendation NPO   Medication Administration: Via alternative means    Other  Recommendations  Oral Care Recommendations: Oral care QID   Follow up Recommendations Skilled Nursing facility      Frequency and Duration min 2x/week  2 weeks       Prognosis Prognosis for Safe Diet Advancement: (fair-good) Barriers to Reach Goals: Cognitive deficits      Swallow Study   General HPI: Pt is a 63 y.o. male recently admitted to [facility] four days with d/c home 09/29/19, now readmitted 2019/10/05 with worsening SOB and headache. PMH includes seizures, CAD, stroke, GERD, heart block, cervical stenosis s/p neck sx with subsequent L-side pain/weakness, CKD, DM2, substance abuse, chronic opiate use. On po's until recently and per chart he has had fluctating altered mental status which continues to alternate between near unresponsiveness and severe agitation with work-up being unrevealing so far. and psychiatry consulted, however so far, they have been unable to assess because of his overt sedation. Made NPO and swallow eval ordered.  Type of Study: Bedside Swallow Evaluation Previous Swallow Assessment: (none) Diet Prior to this Study: NPO;Other (Comment)(excpet po meds ) Temperature Spikes Noted: No Respiratory Status: Nasal cannula History of Recent Intubation: No Behavior/Cognition: Requires cueing;Lethargic/Drowsy Oral Cavity Assessment: Other (comment);Dry(lips bleeding) Oral Care Completed by SLP: Yes Oral Cavity - Dentition: Adequate natural dentition Self-Feeding Abilities: Total assist Patient Positioning: Upright  in bed Baseline Vocal Quality: Low vocal intensity(moaned x 2) Volitional Cough: Cognitively unable to elicit Volitional Swallow: Unable to elicit    Oral/Motor/Sensory Function Overall Oral Motor/Sensory Function: Generalized oral weakness   Ice Chips Ice chips: Impaired Presentation: Spoon Pharyngeal Phase Impairments: Throat Clearing - Delayed;Decreased hyoid-laryngeal movement;Multiple swallows   Thin Liquid Thin Liquid: Impaired Presentation: Spoon Pharyngeal  Phase  Impairments: Multiple swallows;Cough - Delayed;Throat Clearing - Immediate    Nectar Thick Nectar Thick Liquid: Not tested   Honey Thick Honey Thick Liquid: Not tested   Puree Puree: Impaired Pharyngeal Phase Impairments: Multiple swallows;Decreased hyoid-laryngeal movement   Solid     Solid: Not tested      Royce Macadamia 10/19/2019,10:48 AM  Breck Coons Lonell Face.Ed Nurse, children's [phone] Office [phone]

## 2019-10-20 ENCOUNTER — Inpatient Hospital Stay (HOSPITAL_COMMUNITY): Payer: Medicare HMO

## 2019-10-20 DIAGNOSIS — Z4682 Encounter for fitting and adjustment of non-vascular catheter: Secondary | ICD-10-CM | POA: Diagnosis not present

## 2019-10-20 LAB — COMPREHENSIVE METABOLIC PANEL
ALT: 17 U/L (ref 0–44)
AST: 19 U/L (ref 15–41)
Albumin: 2.5 g/dL — ABNORMAL LOW (ref 3.5–5.0)
Alkaline Phosphatase: 43 U/L (ref 38–126)
Anion gap: 10 (ref 5–15)
BUN: 29 mg/dL — ABNORMAL HIGH (ref 8–23)
CO2: 24 mmol/L (ref 22–32)
Calcium: 8.3 mg/dL — ABNORMAL LOW (ref 8.9–10.3)
Chloride: 118 mmol/L — ABNORMAL HIGH (ref 98–111)
Creatinine, Ser: 1.39 mg/dL — ABNORMAL HIGH (ref 0.61–1.24)
GFR calc Af Amer: >60 mL/min (ref >60)
GFR calc non Af Amer: 53 mL/min — ABNORMAL LOW (ref >60)
Glucose, Bld: 87 mg/dL (ref 70–99)
Potassium: 4.7 mmol/L (ref 3.5–5.1)
Sodium: 152 mmol/L — ABNORMAL HIGH (ref 135–145)
Total Bilirubin: 0.9 mg/dL (ref 0.3–1.2)
Total Protein: 7.3 g/dL (ref 6.5–8.1)

## 2019-10-20 LAB — CBC
HCT: 45.7 % (ref 39.0–52.0)
Hemoglobin: 13.5 g/dL (ref 13.0–17.0)
MCH: 27.7 pg (ref 26.0–34.0)
MCHC: 29.5 g/dL — ABNORMAL LOW (ref 30.0–36.0)
MCV: 93.8 fL (ref 80.0–100.0)
Platelets: 253 10*3/uL (ref 150–400)
RBC: 4.87 MIL/uL (ref 4.22–5.81)
RDW: 15.9 % — ABNORMAL HIGH (ref 11.5–15.5)
WBC: 8.8 10*3/uL (ref 4.0–10.5)
nRBC: 0 % (ref 0.0–0.2)

## 2019-10-20 LAB — AMMONIA: Ammonia: 35 umol/L (ref 9–35)

## 2019-10-20 LAB — GLUCOSE, CAPILLARY
Glucose-Capillary: 99 mg/dL (ref 70–99)
Glucose-Capillary: 103 mg/dL — ABNORMAL HIGH (ref 70–99)

## 2019-10-20 MED ORDER — ACETAMINOPHEN 160 MG/5ML PO SOLN
650.0000 mg | Freq: Four times a day (QID) | ORAL | Status: DC | PRN
  Administered 2019-10-23 – 2019-10-27 (×5): 650 mg
  Filled 2019-10-20 (×6): qty 20.3

## 2019-10-20 MED ORDER — VALPROIC ACID 250 MG/5ML PO SOLN
1000.0000 mg | Freq: Every day | ORAL | Status: DC
  Administered 2019-10-20 – 2019-10-22 (×3): 1000 mg
  Filled 2019-10-20 (×4): qty 20

## 2019-10-20 MED ORDER — CLONIDINE HCL 0.1 MG PO TABS
0.1000 mg | ORAL_TABLET | Freq: Three times a day (TID) | ORAL | Status: DC
  Administered 2019-10-20 – 2019-10-23 (×10): 0.1 mg
  Filled 2019-10-20 (×10): qty 1

## 2019-10-20 MED ORDER — BENZTROPINE MESYLATE 0.5 MG PO TABS
1.0000 mg | ORAL_TABLET | Freq: Two times a day (BID) | ORAL | Status: DC
  Administered 2019-10-20 – 2019-11-01 (×25): 1 mg
  Filled 2019-10-20 (×2): qty 1
  Filled 2019-10-20 (×4): qty 2
  Filled 2019-10-20: qty 1
  Filled 2019-10-20: qty 2
  Filled 2019-10-20: qty 1
  Filled 2019-10-20 (×3): qty 2
  Filled 2019-10-20 (×2): qty 1
  Filled 2019-10-20 (×7): qty 2
  Filled 2019-10-20: qty 1
  Filled 2019-10-20 (×6): qty 2

## 2019-10-20 MED ORDER — GLUCERNA 1.5 CAL PO LIQD
1000.0000 mL | ORAL | Status: DC
  Administered 2019-10-20 – 2019-10-26 (×7): 1000 mL
  Filled 2019-10-20 (×13): qty 1000

## 2019-10-20 MED ORDER — QUETIAPINE FUMARATE 25 MG PO TABS
25.0000 mg | ORAL_TABLET | Freq: Two times a day (BID) | ORAL | Status: DC
  Administered 2019-10-21 – 2019-10-22 (×4): 25 mg
  Filled 2019-10-20 (×4): qty 1

## 2019-10-20 MED ORDER — QUETIAPINE FUMARATE 25 MG PO TABS
50.0000 mg | ORAL_TABLET | Freq: Every day | ORAL | Status: DC
  Administered 2019-10-20 – 2019-10-21 (×2): 50 mg
  Filled 2019-10-20 (×2): qty 2

## 2019-10-20 MED ORDER — FREE WATER
200.0000 mL | Status: DC
  Administered 2019-10-20 – 2019-10-22 (×12): 200 mL

## 2019-10-20 MED ORDER — VALPROIC ACID 250 MG/5ML PO SOLN
500.0000 mg | Freq: Every morning | ORAL | Status: DC
  Administered 2019-10-21 – 2019-11-01 (×12): 500 mg
  Filled 2019-10-20 (×13): qty 10

## 2019-10-20 MED ORDER — SENNOSIDES-DOCUSATE SODIUM 8.6-50 MG PO TABS: 1.0000 | ORAL_TABLET | Freq: Every evening | ORAL | Status: DC | PRN

## 2019-10-20 MED ORDER — LACOSAMIDE 50 MG PO TABS
100.0000 mg | ORAL_TABLET | Freq: Two times a day (BID) | ORAL | Status: DC
  Administered 2019-10-20 – 2019-11-01 (×24): 100 mg
  Filled 2019-10-20 (×24): qty 2

## 2019-10-20 NOTE — Plan of Care (Signed)
  Problem: Coping: Goal: Psychosocial and spiritual needs will be supported Outcome: Progressing   Problem: Respiratory: Goal: Will maintain a patent airway Outcome: Progressing Goal: Complications related to the disease process, condition or treatment will be avoided or minimized Outcome: Progressing   Problem: Clinical Measurements: Goal: Ability to maintain clinical measurements within normal limits will improve Outcome: Progressing Goal: Diagnostic test results will improve Outcome: Progressing Goal: Respiratory complications will improve Outcome: Progressing Goal: Cardiovascular complication will be avoided Outcome: Progressing   Problem: Elimination: Goal: Will not experience complications related to bowel motility Outcome: Progressing Goal: Will not experience complications related to urinary retention Outcome: Progressing   Problem: Pain Managment: Goal: General experience of comfort will improve Outcome: Progressing   Problem: Safety: Goal: Ability to remain free from injury will improve Outcome: Progressing   Problem: Skin Integrity: Goal: Risk for impaired skin integrity will decrease Outcome: Progressing   Problem: Education: Goal: Knowledge of risk factors and measures for prevention of condition will improve Outcome: Not Progressing   Problem: Education: Goal: Knowledge of General Education information will improve Description: Including pain rating scale, medication(s)/side effects and non-pharmacologic comfort measures Outcome: Not Progressing   Problem: Health Behavior/Discharge Planning: Goal: Ability to manage health-related needs will improve Outcome: Not Progressing   Problem: Clinical Measurements: Goal: Will remain free from infection Outcome: Not Progressing   Problem: Activity: Goal: Risk for activity intolerance will decrease Outcome: Not Progressing   Problem: Nutrition: Goal: Adequate nutrition will be maintained Outcome: Not  Progressing   Problem: Coping: Goal: Level of anxiety will decrease Outcome: Not Applicable

## 2019-10-20 NOTE — Progress Notes (Signed)
 Nutrition Follow-up  DOCUMENTATION CODES:   Not applicable  INTERVENTION:   Tube Feeding:  Glucerna 1.5 at 60 ml/hr Begin at 20 ml/hr, titrate by 10 mL q 8 hours until goal rate of 60 ml/hr Provides 119 g of protein, 2160 kcals and 1094 mL of free water  Free water flushes: recommend 200 mL q 4 hours. Total free water 2294 mL  NUTRITION DIAGNOSIS:   Inadequate oral intake related to catabolic illness, lethargy/confusion, decreased appetite as evidenced by NPO status, meal completion < 25%.  GOAL:   Patient will meet greater than or equal to 90% of their needs  MONITOR:   PO intake, Labs, Weight trends, TF tolerance, Diet advancement  REASON FOR ASSESSMENT:   Consult Enteral/tube feeding initiation and management  ASSESSMENT:   63 yo admitted with COVID pneumonia, acute encephalopathy. PMH includes CAD, polysubstance abuse, DM, HLD, HTN, CKD, CVA, bipolar disorder   RD working remotely.  12/11 Admit 12/28 NPO due to lethargy, SLP eval 12/29 Cortrak placed today; gastric  Pt with 1:1 sitter  Recorded po intake 0-50% of meals; no po intake since 12/26.   Pt with severe aspiration risk per SLP. NPO and Cortrak placed for feedings  Current wt 102.9 kg; admit weight 95.3 kg. Unsure of accuracy of weights.   Hypernatremic; no IVF, poor intake. Recommend free water flushes via tube  Labs: sodium 152 (H) Meds: reviewed   Diet Order:   Diet Order            Diet NPO time specified  Diet effective now              EDUCATION NEEDS:   Not appropriate for education at this time  Skin:  Skin Assessment: Reviewed RN Assessment  Last BM:  12/27  Height:   Ht Readings from Last 1 Encounters:  10/03/19 5\' 11"  (1.803 m)    Weight:   Wt Readings from Last 1 Encounters:  10/10/19 102.9 kg    Ideal Body Weight:  78.2 kg  BMI:  Body mass index is 31.64 kg/m.  Estimated Nutritional Needs:   Kcal:  2050-2450 kcals  Protein:  103-123 g  Fluid:   >/= 2 L    Jemar Paulsen MS, RDN, LDN, CNSC [phone] Pager  [phone] Weekend/On-Call Pager

## 2019-10-20 NOTE — Progress Notes (Signed)
Patient had dob hoff placed; confirmed placement with cxr. Attending placed consult to nutrition for tube feeding orders. During this time, patient's MEWS score elevated to 6. Patient is mouth breather, and nasal cannula was now competing for nose space since the dob hoff was inserted. Placed patient on 6L non rebreather for a few minutes and his O2 sats came up to high 90s. Able to titrate down to 3 L non rebreather and maintain sats in the low 90s. Patient's blood pressure and elevated HR were treated with his 1200 dose of lopressor, and these VS have improved. MEWS score now a 2. Patient's urine is also turning pink/red; attending notified and aware. Will continue to monitor; 1:1 sitter at bedside.

## 2019-10-20 NOTE — Procedures (Addendum)
Cortrak  Person Inserting Tube:  Russell Carter, RD Tube Type:  Cortrak - 43 inches Tube Location:  Left nare Initial Placement:  Stomach Secured by: Bridle Technique Used to Measure Tube Placement:  Documented cm marking at nare/ corner of mouth Cortrak Secured At:  60 cm    Cortrak Tube Team Note:  Consult received to place a Cortrak feeding tube.  Difficult placement, pt restrained but moving during placement. Sitter at bedside to assist.   X-ray is required, abdominal x-ray has been ordered by the Cortrak team. Please confirm tube placement before using the Cortrak tube.  Noted xray, appears to be in pt's stomach. Xray not yet read by radiology.   If the tube becomes dislodged please keep the tube and contact the Cortrak team at [email or web address] (password [email or web address]) for replacement.  If after hours and replacement cannot be delayed, place a NG tube and confirm placement with an abdominal x-ray.    [place], [place], [place] Pager [phone] After Hours Pager

## 2019-10-20 NOTE — Progress Notes (Signed)
Szymon Hoppes  BLT:[id number] DOB: 1956/04/28 DOA: 10/12/2019 PCP: Marcine Matar, MD    Brief Narrative:  63 y.o.malewith medical history significant ofCAD, osteoarthritis, polysubstance abuse, chronic anticoagulation, diabetes, cocaine abuse, hyperlipidemia, cervical stenosis, anxiety disorder,hypertension, chronic kidney disease, seizure disorder, history of CVA, andbipolar disorderwho was recently admitted with COVID pneumonia, who returned with worsening symptoms.  He was discharged on 12/8.  He was no longer hypoxic.  His inflammatory markers have become normal.  Due to hypoxia patient was hospitalized for further management.  Significant Events: 12/11 admit via [facility] ED  COVID-19 specific Treatment: Remdesivir 12/12 > 12/16 Decadron 12/11 > 12/19  Subjective: Mental status further improvement or decline.  No apparent acute distress clinically though remains quite delirious/altered.  Assessment & Plan:  COVID Pneumonia -acute hypoxic respiratory failure Has completed a second course of remdesivir - Decadron discontinued out of fear it was contributing to his acute delirium - wean oxygen as able (is a mouth breather)   Acute metabolic encephalopathy -altered mental status Work-up has been extensive -being treated for possible UTI which could be contributing but otherwise etiology remains unclear - Psychiatry has evaluated and recommends a scheduled regimen of Ativan, Haldol, and Cogentin which is being administered - has a complex history of polysubstance abuse -no physiologic findings to suggest acute withdrawal - hepatic encephalopathy being treated without significant stabilization of mental status - BUN not severely elevated at this time - neurologic exam is nonfocal -unclear to me if this is metabolic or psychiatric issue -no further episodes to suggest this is seizure activity - serial CT head nonacute -continue current care and monitor  Fall without apparent  injury Patient suffered an unwitnessed fall in his room 12/16 -physical exam post fall did not reveal any clear evidence of injury/trauma -CT scan head post fall was without acute findings, and repeat today remains the same    Acute renal failure No prior history of kidney disease -creatinine continues to fluctuate but overall is stable  History of CAD Continue aspirin and statin  History of CVA Continue usual home medications  DM 2 - uncontrolled with hyperglycemia CBGs well controlled    Gout Continue allopurinol  Bipolar disorder Question the extent to which this is contributing to his current mental status fluctuations - meds per Psychiatry   Seizure disorder Continue Depakote and Vimpat - note Vimpat returned to his usual dose 12/18 w/ improved renal fxn -consider EEG 12/23 if mental status changes persist  HTN Blood pressure trending upward -adjust medication and follow  BPH Continue Flomax  Chronic opioid use holding narcoitcs due to altered mental status  Prior history of alcohol abuse Unclear if patient was drinking prior to admit - should be beyond window for withdrawal at this time regardless   Nutrition Did very poorly with SLP eval yesterday -core track feeding tube placed today -begin tube feeds  DVT prophylaxis: Eliquis Code Status: FULL CODE Family Communication:  Disposition Plan: tele   Consultants:  none  Antimicrobials:  None  Objective: Blood pressure (!) 163/98, pulse 98, temperature 99.1 F (37.3 C), temperature source Axillary, resp. rate (!) 32, height 5\' 11"  (1.803 m), weight 102.9 kg, SpO2 100 %.  Intake/Output Summary (Last 24 hours) at 10/20/2019 1655 Last data filed at 10/20/2019 1410 Gross per 24 hour  Intake 194.64 ml  Output 1350 ml  Net -1155.36 ml   Filed Weights   10/03/19 1642 10/10/19 0351  Weight: 95.3 kg 102.9 kg    Examination: General: No  acute respiratory distress -delirious Lungs: Good air movement  throughout -no wheezing Cardiovascular: RRR -no rub Abdomen: NT/ND, soft, BS+, no rebound Extremities: No C/C/E B LE  Neuro: no cranial nerve deficits - moves all 4 ext spontaneously -will not follow commands  CBC: Recent Labs  Lab 10/16/19 0300 10/19/19 0425 10/20/19 0523  WBC 11.3* 9.1 8.8  HGB 14.0 12.9* 13.5  HCT 45.4 43.1 45.7  MCV 88.7 93.1 93.8  PLT 195 236 330   Basic Metabolic Panel: Recent Labs  Lab 10/14/19 0430 10/15/19 1342 10/16/19 0300 10/20/19 0523  NA 139 141 143 152*  K 4.2 4.4 4.7 4.7  CL 101  --  106 118*  CO2 27  --  25 24  GLUCOSE 141*  --  99 87  BUN 33*  --  27* 29*  CREATININE 1.44*  --  1.10 1.39*  CALCIUM 9.3  --  9.1 8.3*   GFR: Estimated Creatinine Clearance: 65.5 mL/min (A) (by C-G formula based on SCr of 1.39 mg/dL (H)).  Liver Function Tests: Recent Labs  Lab 10/14/19 0430 10/16/19 0300 10/20/19 0523  AST 20 19 19   ALT 18 19 17   ALKPHOS 62 49 43  BILITOT 0.9 0.6 0.9  PROT 7.3 7.5 7.3  ALBUMIN 2.5* 2.6* 2.5*    Recent Labs  Lab 10/14/19 0905 10/20/19 0523  AMMONIA 28 35    HbA1C: HbA1c, POC (controlled diabetic range)  Date/Time Value Ref Range Status  06/06/2018 03:03 PM 5.7 0.0 - 7.0 % Final   Hgb A1c MFr Bld  Date/Time Value Ref Range Status  08/19/2019 05:41 PM 6.3 (H) 4.8 - 5.6 % Final    Comment:    (NOTE) Pre diabetes:          5.7%-6.4% Diabetes:              >6.4% Glycemic control for   <7.0% adults with diabetes   12/25/2018 08:01 AM 6.3 (H) 4.8 - 5.6 % Final    Comment:    (NOTE) Pre diabetes:          5.7%-6.4% Diabetes:              >6.4% Glycemic control for   <7.0% adults with diabetes     Scheduled Meds: . benztropine mesylate  1 mg Intramuscular Q12H  . Chlorhexidine Gluconate Cloth  6 each Topical Daily  . divalproex  1,000 mg Oral QHS  . enoxaparin (LOVENOX) injection  100 mg Subcutaneous Q12H  . free water  200 mL Per Tube Q4H  . haloperidol lactate  5 mg Intravenous BID  .  Ipratropium-Albuterol  1 puff Inhalation Q6H  . lacosamide  100 mg Oral BID  . LORazepam  1 mg Intravenous TID  . metoprolol tartrate  5 mg Intravenous Q6H  . neomycin-bacitracin-polymyxin   Topical BID  . pantoprazole (PROTONIX) IV  40 mg Intravenous QHS  . QUEtiapine  25 mg Oral BID  . QUEtiapine  50 mg Oral QHS  . sodium chloride flush  10-40 mL Intracatheter Q12H  . tamsulosin  0.4 mg Oral QHS     LOS: 18 days   Cherene Altes, MD [facility] Office  [phone] Pager - Text Page per Amion  If 7PM-7AM, please contact night-coverage per Amion 10/20/2019, 4:55 PM

## 2019-10-20 NOTE — Progress Notes (Signed)
PT Cancellation Note  Patient Details Name: Russell Carter MRN: [id number] DOB: [birth year]   Cancelled Treatment:    Reason Eval/Treat Not Completed: Patient's level of consciousness.  Horald Chestnut, PT    Delford Field 10/20/2019, 3:46 PM

## 2019-10-21 LAB — VALPROIC ACID LEVEL: Valproic Acid Lvl: 24 ug/mL — ABNORMAL LOW (ref 50.0–100.0)

## 2019-10-21 LAB — GLUCOSE, CAPILLARY
Glucose-Capillary: 114 mg/dL — ABNORMAL HIGH (ref 70–99)
Glucose-Capillary: 108 mg/dL — ABNORMAL HIGH (ref 70–99)
Glucose-Capillary: 117 mg/dL — ABNORMAL HIGH (ref 70–99)
Glucose-Capillary: 125 mg/dL — ABNORMAL HIGH (ref 70–99)

## 2019-10-21 MED ORDER — LORAZEPAM 0.5 MG PO TABS
1.0000 mg | ORAL_TABLET | Freq: Three times a day (TID) | ORAL | Status: DC
  Administered 2019-10-21 – 2019-10-22 (×3): 1 mg
  Filled 2019-10-21 (×4): qty 2

## 2019-10-21 MED ORDER — ALTEPLASE 2 MG IJ SOLR
2.0000 mg | Freq: Once | INTRAMUSCULAR | Status: AC
  Administered 2019-10-21: 2 mg
  Filled 2019-10-21: qty 2

## 2019-10-21 MED ORDER — FAMOTIDINE 40 MG/5ML PO SUSR
20.0000 mg | Freq: Every day | ORAL | Status: DC
  Administered 2019-10-21 – 2019-10-23 (×3): 20 mg
  Filled 2019-10-21 (×3): qty 2.5

## 2019-10-21 NOTE — Progress Notes (Signed)
Russell Carter  DPO:[id number] DOB: 08-02-1956 DOA: 09/28/2019 PCP: Marcine Matar, MD    Brief Narrative:  63 y.o.malewith medical history significant ofCAD, osteoarthritis, polysubstance abuse, chronic anticoagulation, diabetes, cocaine abuse, hyperlipidemia, cervical stenosis, anxiety disorder,hypertension, chronic kidney disease, seizure disorder, history of CVA, andbipolar disorderwho was recently admitted with COVID pneumonia, who returned with worsening symptoms.  He was discharged on 12/8.  He was no longer hypoxic.  His inflammatory markers have become normal.  Due to hypoxia patient was hospitalized for further management.  Significant Events: 12/11 admit via [facility] ED  COVID-19 specific Treatment: Remdesivir 12/12 > 12/16 Decadron 12/11 > 12/19  Subjective: More alert than I have seen him in many days today.  Will answer some of my short direct questions.  Does not appear to be in acute distress.  Would not follow commands consistently.  No evidence of uncontrolled pain.  Assessment & Plan:  COVID Pneumonia -acute hypoxic respiratory failure Has completed a second course of remdesivir - Decadron discontinued out of fear it was contributing to his acute delirium - wean oxygen as able (is a mouth breather)   Acute metabolic encephalopathy -altered mental status Work-up has been extensive -being treated for possible UTI which could be contributing but otherwise etiology remains unclear - Psychiatry has evaluated and recommends a scheduled regimen of Ativan, Haldol, and Cogentin which is being administered - has a complex history of polysubstance abuse -no physiologic findings to suggest acute withdrawal - hepatic encephalopathy being treated without significant stabilization of mental status - BUN not severely elevated at this time - neurologic exam is nonfocal -unclear to me if this is metabolic or psychiatric issue -no further episodes to suggest this is seizure  activity - serial CT head nonacute -now appears to be responding to his current regimen being administered via his recently placed feeding tube  Fall without apparent injury Patient suffered an unwitnessed fall in his room 12/16 -physical exam post fall did not reveal any clear evidence of injury/trauma -CT scan head post fall was without acute findings, and repeat today remains the same    Acute renal failure No prior history of kidney disease -creatinine continues to fluctuate but overall is stable  History of CAD Continue aspirin and statin  History of CVA Continue usual home medications  DM 2 - uncontrolled with hyperglycemia CBGs well controlled    Gout Continue allopurinol  Bipolar disorder Question the extent to which this is contributing to his current mental status fluctuations - meds per Psychiatry   Seizure disorder Continue Depakote and Vimpat   HTN Blood pressure appears to be improving -follow without change in treatment today  BPH Continue Flomax  Chronic opioid use holding narcoitcs due to altered mental status  Prior history of alcohol abuse Unclear if patient was drinking prior to admit -well beyond the window of withdrawal  Nutrition coretrack feeding tube placed 12/29 -continue tube feeds  DVT prophylaxis: Eliquis Code Status: FULL CODE Family Communication:  Disposition Plan: MedSurg  Consultants:  none  Antimicrobials:  None  Objective: Blood pressure (!) 156/98, pulse (!) 108, temperature 98.7 F (37.1 C), temperature source Axillary, resp. rate (!) 24, height 5\' 11"  (1.803 m), weight 102.9 kg, SpO2 100 %.  Intake/Output Summary (Last 24 hours) at 10/21/2019 0952 Last data filed at 10/21/2019 0920 Gross per 24 hour  Intake 40 ml  Output 850 ml  Net -810 ml   Filed Weights   10/03/19 1642 10/10/19 0351  Weight: 95.3 kg 102.9  kg    Examination: General: No acute respiratory distress -significantly more alert today Lungs: Good  air movement throughout -mouth breathing Cardiovascular: RRR  Abdomen: NT/ND, soft, BS+, no rebound Extremities: No C/C/E B LE  Neuro: no cranial nerve deficits - moves all 4 ext spont  CBC: Recent Labs  Lab 10/16/19 0300 10/19/19 0425 10/20/19 0523  WBC 11.3* 9.1 8.8  HGB 14.0 12.9* 13.5  HCT 45.4 43.1 45.7  MCV 88.7 93.1 93.8  PLT 195 236 409   Basic Metabolic Panel: Recent Labs  Lab 10/15/19 1342 10/16/19 0300 10/20/19 0523  NA 141 143 152*  K 4.4 4.7 4.7  CL  --  106 118*  CO2  --  25 24  GLUCOSE  --  99 87  BUN  --  27* 29*  CREATININE  --  1.10 1.39*  CALCIUM  --  9.1 8.3*   GFR: Estimated Creatinine Clearance: 65.5 mL/min (A) (by C-G formula based on SCr of 1.39 mg/dL (H)).  Liver Function Tests: Recent Labs  Lab 10/16/19 0300 10/20/19 0523  AST 19 19  ALT 19 17  ALKPHOS 49 43  BILITOT 0.6 0.9  PROT 7.5 7.3  ALBUMIN 2.6* 2.5*    Recent Labs  Lab 10/20/19 0523  AMMONIA 35    HbA1C: HbA1c, POC (controlled diabetic range)  Date/Time Value Ref Range Status  06/06/2018 03:03 PM 5.7 0.0 - 7.0 % Final   Hgb A1c MFr Bld  Date/Time Value Ref Range Status  08/19/2019 05:41 PM 6.3 (H) 4.8 - 5.6 % Final    Comment:    (NOTE) Pre diabetes:          5.7%-6.4% Diabetes:              >6.4% Glycemic control for   <7.0% adults with diabetes   12/25/2018 08:01 AM 6.3 (H) 4.8 - 5.6 % Final    Comment:    (NOTE) Pre diabetes:          5.7%-6.4% Diabetes:              >6.4% Glycemic control for   <7.0% adults with diabetes     Scheduled Meds: . alteplase  2 mg Intracatheter Once  . alteplase  2 mg Intracatheter Once  . benztropine  1 mg Per Tube BID  . Chlorhexidine Gluconate Cloth  6 each Topical Daily  . cloNIDine  0.1 mg Per Tube TID  . enoxaparin (LOVENOX) injection  100 mg Subcutaneous Q12H  . free water  200 mL Per Tube Q4H  . Ipratropium-Albuterol  1 puff Inhalation Q6H  . lacosamide  100 mg Per Tube BID  . LORazepam  1 mg Intravenous  TID  . neomycin-bacitracin-polymyxin   Topical BID  . pantoprazole (PROTONIX) IV  40 mg Intravenous QHS  . QUEtiapine  25 mg Per Tube BID  . QUEtiapine  50 mg Per Tube QHS  . sodium chloride flush  10-40 mL Intracatheter Q12H  . tamsulosin  0.4 mg Oral QHS  . valproic acid  1,000 mg Per Tube QHS  . valproic acid  500 mg Per Tube q morning - 10a     LOS: 19 days   Cherene Altes, MD [facility] Office  [phone] Pager - Text Page per Amion  If 7PM-7AM, please contact night-coverage per Amion 10/21/2019, 9:52 AM

## 2019-10-21 NOTE — Progress Notes (Signed)
Pt. c/o chest tightness.  Pt. later said that he felt better.  v/s are:126/95, 101, 25 and 100% on non rebreather.  EKG showed ST abnormality.  Dr. Thereasa Solo notified.

## 2019-10-21 NOTE — Progress Notes (Signed)
Physical Therapy Treatment Patient Details Name: Russell Carter MRN: [id number] DOB: November 08, 1955 Today's Date: 10/21/2019    History of Present Illness Pt is a 63 y.o. male recently admitted to [facility] four days with d/c home 09/29/19, now readmitted 10/21/2019 with worsening SOB and headache. PMH includes seizures, CAD, stroke, heart block, cervical stenosis s/p neck sx with subsequent L-side pain/weakness, CKD, DM2, substance abuse, chronic opiate use.    PT Comments    Pt was more alert this pm for tx session, he did not keep eyes open but was able to actively participate in skilled tx. Pt able to roll in bed with min a and cues, able to sit edge of bed with mod a x2 and max cues, once sitting EOB pt attempts to stand several times but noted that feet are not on ground pt is attempting to stand on toes. Once feet placed on floor pt able to stand x 3 with mod a x2 and hand held assist. Between stands pt sitting edge of bed noted to attempt several times to remove mitts and to squeeze therapists hand or dig nails into therapist hand. Not sure what pt was attempting to convey other than wanting to remove restraints. Attempted to take some steps at edge of bed and pt was minimally able to take few steps to right, noted that had difficulty with un-weighting and moving RLE. Once completed with session pt returned to supine and immediately fell asleep. Pt was on 10L on NRB and sats mostly stayed in 90s when flow was not interrupted but noted pt shaking head and attempting to dislodge mask from face several times.   Follow Up Recommendations  SNF     Equipment Recommendations  None recommended by PT    Recommendations for Other Services       Precautions / Restrictions Precautions Precautions: Fall Precaution Comments: cognition Restrictions Weight Bearing Restrictions: No    Mobility  Bed Mobility Overal bed mobility: Needs Assistance Bed Mobility: Supine to Sit;Sit to Supine     Supine to  sit: Mod assist;+2 for physical assistance Sit to supine: Mod assist;+2 for physical assistance      Transfers Overall transfer level: Needs assistance Equipment used: 2 person hand held assist Transfers: Sit to/from Stand Sit to Stand: +2 physical assistance;Mod assist         General transfer comment: able to stand at EOB x 2 with mod a x 2  Ambulation/Gait Ambulation/Gait assistance: Mod assist;+2 physical assistance Gait Distance (Feet): 3 Feet Assistive device: 2 person hand held assist Gait Pattern/deviations: Staggering right     General Gait Details: able to minimally take 3 steps at EOB with mod a x2 noted has difficulty with unweighting RLE   Stairs             Wheelchair Mobility    Modified Rankin (Stroke Patients Only)       Balance Overall balance assessment: Needs assistance Sitting-balance support: Feet supported;Bilateral upper extremity supported Sitting balance-Leahy Scale: Fair   Postural control: Right lateral lean;Left lateral lean Standing balance support: Bilateral upper extremity supported Standing balance-Leahy Scale: Poor                              Cognition Arousal/Alertness: Lethargic Behavior During Therapy: Restless;Agitated Overall Cognitive Status: Impaired/Different from baseline Area of Impairment: Orientation;Attention;Memory;Following commands;Safety/judgement;Awareness;Problem solving                 Orientation Level:  Disoriented to;Place;Time;Situation Current Attention Level: Alternating Memory: Decreased recall of precautions;Decreased short-term memory Following Commands: Follows one step commands inconsistently Safety/Judgement: Decreased awareness of safety;Decreased awareness of deficits   Problem Solving: Slow processing;Decreased initiation;Difficulty sequencing;Requires verbal cues;Requires tactile cues        Exercises      General Comments        Pertinent Vitals/Pain  Pain Assessment: Faces Faces Pain Scale: Hurts even more Pain Location: has pain in RLE with motion and also in ear with adjustment of NRB Pain Descriptors / Indicators: Discomfort;Guarding;Grimacing;Moaning Pain Intervention(s): Limited activity within patient's tolerance;Monitored during session    Home Living                      Prior Function            PT Goals (current goals can now be found in the care plan section) Acute Rehab PT Goals Patient Stated Goal: unable to state goal Time For Goal Achievement: 10/31/19 Potential to Achieve Goals: Fair Progress towards PT goals: Not progressing toward goals - comment    Frequency    Min 3X/week      PT Plan Current plan remains appropriate    Co-evaluation              AM-PAC PT "6 Clicks" Mobility   Outcome Measure  Help needed turning from your back to your side while in a flat bed without using bedrails?: A Little Help needed moving from lying on your back to sitting on the side of a flat bed without using bedrails?: A Lot Help needed moving to and from a bed to a chair (including a wheelchair)?: A Lot Help needed standing up from a chair using your arms (e.g., wheelchair or bedside chair)?: A Lot Help needed to walk in hospital room?: A Lot Help needed climbing 3-5 steps with a railing? : Total 6 Click Score: 12    End of Session Equipment Utilized During Treatment: Oxygen Activity Tolerance: Treatment limited secondary to medical complications (Comment);Patient limited by lethargy;Patient limited by fatigue;Treatment limited secondary to agitation Patient left: in bed;with call bell/phone within reach;with nursing/sitter in room Nurse Communication: Mobility status PT Visit Diagnosis: Difficulty in walking, not elsewhere classified (R26.2);Other abnormalities of gait and mobility (R26.89)     Time: 4098-1191 PT Time Calculation (min) (ACUTE ONLY): 26 min  Charges:  $Therapeutic Activity:  23-37 mins                     Drema Pry, PT    Freddi Starr 10/21/2019, 4:27 PM

## 2019-10-21 NOTE — Plan of Care (Signed)
  Problem: Coping: Goal: Psychosocial and spiritual needs will be supported Outcome: Progressing   Problem: Respiratory: Goal: Will maintain a patent airway Outcome: Progressing Goal: Complications related to the disease process, condition or treatment will be avoided or minimized Outcome: Progressing   Problem: Clinical Measurements: Goal: Ability to maintain clinical measurements within normal limits will improve Outcome: Progressing Goal: Will remain free from infection Outcome: Progressing Goal: Diagnostic test results will improve Outcome: Progressing Goal: Respiratory complications will improve Outcome: Progressing Goal: Cardiovascular complication will be avoided Outcome: Progressing   Problem: Nutrition: Goal: Adequate nutrition will be maintained Outcome: Progressing   Problem: Elimination: Goal: Will not experience complications related to bowel motility Outcome: Progressing Goal: Will not experience complications related to urinary retention Outcome: Progressing   Problem: Pain Managment: Goal: General experience of comfort will improve Outcome: Progressing   Problem: Safety: Goal: Ability to remain free from injury will improve Outcome: Progressing   Problem: Skin Integrity: Goal: Risk for impaired skin integrity will decrease Outcome: Progressing   Problem: Education: Goal: Knowledge of risk factors and measures for prevention of condition will improve Outcome: Not Progressing   Problem: Education: Goal: Knowledge of General Education information will improve Description: Including pain rating scale, medication(s)/side effects and non-pharmacologic comfort measures Outcome: Not Progressing   Problem: Health Behavior/Discharge Planning: Goal: Ability to manage health-related needs will improve Outcome: Not Progressing   Problem: Activity: Goal: Risk for activity intolerance will decrease Outcome: Not Progressing

## 2019-10-21 NOTE — Progress Notes (Signed)
Spoke to rapid response about MEWs of 4. She is aware and will continue to monitor. Will notify if MEWs increases.

## 2019-10-21 NOTE — Progress Notes (Signed)
Changed patient's tube feeding equipment and glucerna medication per 24 hour protocol.

## 2019-10-22 ENCOUNTER — Inpatient Hospital Stay (HOSPITAL_COMMUNITY): Payer: Medicare HMO

## 2019-10-22 DIAGNOSIS — U071 COVID-19: Secondary | ICD-10-CM | POA: Diagnosis not present

## 2019-10-22 DIAGNOSIS — J181 Lobar pneumonia, unspecified organism: Secondary | ICD-10-CM | POA: Diagnosis not present

## 2019-10-22 LAB — COMPREHENSIVE METABOLIC PANEL
ALT: 24 U/L (ref 0–44)
AST: 21 U/L (ref 15–41)
Albumin: 2.4 g/dL — ABNORMAL LOW (ref 3.5–5.0)
Alkaline Phosphatase: 47 U/L (ref 38–126)
Anion gap: 12 (ref 5–15)
BUN: 23 mg/dL (ref 8–23)
CO2: 27 mmol/L (ref 22–32)
Calcium: 8.2 mg/dL — ABNORMAL LOW (ref 8.9–10.3)
Chloride: 114 mmol/L — ABNORMAL HIGH (ref 98–111)
Creatinine, Ser: 1.15 mg/dL (ref 0.61–1.24)
GFR calc Af Amer: >60 mL/min (ref >60)
GFR calc non Af Amer: >60 mL/min (ref >60)
Glucose, Bld: 113 mg/dL — ABNORMAL HIGH (ref 70–99)
Potassium: 4.8 mmol/L (ref 3.5–5.1)
Sodium: 153 mmol/L — ABNORMAL HIGH (ref 135–145)
Total Bilirubin: 0.3 mg/dL (ref 0.3–1.2)
Total Protein: 7.7 g/dL (ref 6.5–8.1)

## 2019-10-22 LAB — GLUCOSE, CAPILLARY
Glucose-Capillary: 113 mg/dL — ABNORMAL HIGH (ref 70–99)
Glucose-Capillary: 123 mg/dL — ABNORMAL HIGH (ref 70–99)
Glucose-Capillary: 135 mg/dL — ABNORMAL HIGH (ref 70–99)
Glucose-Capillary: 140 mg/dL — ABNORMAL HIGH (ref 70–99)

## 2019-10-22 MED ORDER — HALOPERIDOL LACTATE 5 MG/ML IJ SOLN
1.0000 mg | Freq: Four times a day (QID) | INTRAMUSCULAR | Status: DC | PRN
  Administered 2019-10-23: 08:00:00 1 mg via INTRAVENOUS
  Filled 2019-10-22: qty 1

## 2019-10-22 MED ORDER — LORAZEPAM 0.5 MG PO TABS
0.5000 mg | ORAL_TABLET | Freq: Three times a day (TID) | ORAL | Status: DC
  Administered 2019-10-22 – 2019-10-23 (×3): 0.5 mg
  Filled 2019-10-22 (×3): qty 1

## 2019-10-22 MED ORDER — FREE WATER
300.0000 mL | Status: DC
  Administered 2019-10-22 – 2019-11-01 (×61): 300 mL

## 2019-10-22 MED ORDER — QUETIAPINE FUMARATE 25 MG PO TABS
25.0000 mg | ORAL_TABLET | Freq: Every day | ORAL | Status: DC
  Administered 2019-10-22: 23:00:00 25 mg
  Filled 2019-10-22: qty 1

## 2019-10-22 NOTE — Progress Notes (Signed)
SLP Cancellation Note  Patient Details Name: Russell Carter MRN: [id number] DOB: Aug 11, 1956   Cancelled treatment:        Pt npo with [facility] -seen for dysphagia treatment working toward goal of safe po consumption. Sitter present; pt sleeping, moved eyes but could not open. He had Haldol at 1415 and Seroquel as well. Will continue efforts. He receives sedating meds frequently and is unable to participate to work toward improvements and functional status which is barrier to progress.      Houston Siren 10/22/2019, 3:32 PM

## 2019-10-22 NOTE — Progress Notes (Signed)
OT Cancellation Note  Patient Details Name: Russell Carter MRN: [id number] DOB: 08-18-1956   Cancelled Treatment:    Reason Eval/Treat Not Completed: Fatigue/lethargy limiting ability to participate;Patient's level of consciousness. Consulted with RN. Due to agitation, pt heavily sedated with haldol and Seroquel with pt in bilateral mitts. Upon OT arrival, pt sleeping with sitter present. Pt unable to open eyes or maintain alertness. Due to heavy sedation, pt unable to participate in therapy this date. OT will continue to follow.   Mauri Brooklyn 10/22/2019, 4:29 PM

## 2019-10-22 NOTE — Progress Notes (Signed)
[place] for Lovenox while Apixaban on hold Indication: History of PE  Allergies  Allergen Reactions  . Shellfish Allergy Anaphylaxis    Patient Measurements: Height: 5\' 11"  (180.3 cm) Weight: 226 lb 13.7 oz (102.9 kg) IBW/kg (Calculated) : 75.3  Vital Signs: Temp: 97.7 F (36.5 C) (12/31 0724) Temp Source: Axillary (12/31 0724) BP: 146/91 (12/31 0724) Pulse Rate: 119 (12/31 0724)  Labs: Recent Labs    10/20/19 0523 10/22/19 0541  HGB 13.5  --   HCT 45.7  --   PLT 253  --   CREATININE 1.39* 1.15    Estimated Creatinine Clearance: 79.2 mL/min (by C-G formula based on SCr of 1.15 mg/dL).   Medical History: Past Medical History:  Diagnosis Date  . Acute kidney injury ([place]) 04/27/2018  . AKI (acute kidney injury) ([place])   . Anginal pain ([place])   . Anxiety   . Arthritis   . Bipolar disorder ([place])   . Blood in stool   . CAD (coronary artery disease)    a. 2011: cath showing mod non-obstructive disease b. 02/2016: cath with 95% stenosis in the Mid RCA. DES placed.  . Cervical stenosis of spine   . Chronic anticoagulation 04/27/2018  . Chronic pain disorder 06/05/2017  . CKD (chronic kidney disease), stage III 05/06/2018  . CKD (chronic kidney disease), stage III 05/06/2018  . Cocaine abuse ([place])    history  . Complete heart block ([place]) 03/14/2016  . Depression   . Diabetes mellitus type 2 in nonobese (H[facility])   . Diabetes mellitus without complication ([place])   . Dyslipidemia 03/15/2016  . Essential hypertension 05/18/2009   Qualifier: Diagnosis of  By: Burnett Kanaris    . Gastroesophageal reflux disease without esophagitis 07/31/2017  . Gout, unspecified   . Headache   . History of CVA (cerebrovascular accident)   . History of pulmonary embolus (PE) 02/14/2017  . HTN (hypertension)   . Hypercholesterolemia   . Hyperglycemia   . Leukopenia 05/06/2018  . Migraines   . Myocardial infarction [facility]) 2008   drug cocaine and marijuana  at time of mi none  since    . Normocytic anemia 05/06/2018  . Pneumonia   . Polysubstance abuse ([place]) 01/15/2013  . Rectal bleeding   . Renal insufficiency   . Seizure ([place]) 05/06/2018  . Seizures ([place])   . Stroke ([place])   . Vertigo     Medications:  Scheduled:  . benztropine  1 mg Per Tube BID  . Chlorhexidine Gluconate Cloth  6 each Topical Daily  . cloNIDine  0.1 mg Per Tube TID  . enoxaparin (LOVENOX) injection  100 mg Subcutaneous Q12H  . famotidine  20 mg Per Tube Daily  . free water  200 mL Per Tube Q4H  . Ipratropium-Albuterol  1 puff Inhalation Q6H  . lacosamide  100 mg Per Tube BID  . LORazepam  1 mg Per Tube TID  . neomycin-bacitracin-polymyxin   Topical BID  . QUEtiapine  25 mg Per Tube BID  . QUEtiapine  50 mg Per Tube QHS  . sodium chloride flush  10-40 mL Intracatheter Q12H  . tamsulosin  0.4 mg Oral QHS  . valproic acid  1,000 mg Per Tube QHS  . valproic acid  500 mg Per Tube q morning - 10a   Infusions:  . feeding supplement (GLUCERNA 1.5 CAL) 60 mL/hr at 10/22/19 0127   PRN: acetaminophen (TYLENOL) oral liquid 160 mg/5 mL, acetaminophen, haloperidol lactate, naphazoline-pheniramine, nitroGLYCERIN, [DISCONTINUED]  ondansetron **OR** ondansetron (ZOFRAN) IV, senna-docusate, sodium chloride flush  Assessment: 63 yo male recently admitted 12/4-12/8 for COVID, readmitted 12/11 with hypoxia.  On apixaban for history of PE, now unable to take PO meds due to encephalopathy.  Pharmacy consulted to dose Lovenox.  SCr stable, next CBC 1/1   Goal of Therapy:  Anti-Xa level 0.6-1 units/ml 4hrs after LMWH dose given Monitor platelets by anticoagulation protocol: Yes   Plan:   Continue Lovenox 1mg /kg SQ q12h  Monitor renal function, CBC, signs/symptoms of bleeding  , PharmD, BCPS 10/22/2019,9:48 AM

## 2019-10-22 NOTE — Progress Notes (Signed)
Russell Carter  VEH:[id number] DOB: [birth year] DOA: 09/28/2019 PCP: Ladell Pier, MD    Brief Narrative:  63 y.o.malewith medical history significant for CAD, osteoarthritis, polysubstance abuse, PE on chronic anticoagulation, diabetes, cocaine abuse, hyperlipidemia, cervical stenosis, anxiety disorder,hypertension, chronic kidney disease, seizure disorder, CVA, andbipolar disorderwho was recently admitted with COVID pneumonia, who returned with worsening symptoms.  He was discharged on 12/8.  He was no longer hypoxic.  His inflammatory markers have become normal.  Due to hypoxia patient was hospitalized for further management.  Significant Events: 12/4 > 12/8 admit [facility] - tx w/ steroids + remdesivir - d/c home on RA 12/11 re-admit via [facility] 12/14 mental status beginning to decline  12/16 unwitnessed fall in room - f/u CT head unrevealing  12/23 Psychiatry consulted   COVID-19 specific Treatment: Remdesivir course #2 12/12 > 12/16 Decadron course #2 12/11 > 12/19  Subjective: Hypernatremic by labs this morning.  BUN not elevated.  Follow-up CXR today suggest significantly worsening bilateral pulmonary infiltrates. The pt is sedate again today, but his sitter reports he has had episodes of agitation and alertness. He does not appear to be in acute distress.   Assessment & Plan:  Worsening B "infiltrates" on CXR Etiology unclear - edema v/s infectious - TTE March 2020 w/ preserved ED - pt is not febrile and WBC is normal - ?aspiration pneumonoitis - ?post COVID fibrotic change - check procalcitonin   Hypernatremia  Could be contributing to AMS - increase free water via Cortrak   COVID Pneumonia - acute hypoxic respiratory failure Has completed a second course of remdesivir - Decadron discontinued out of fear it was contributing to his acute delirium - wean oxygen as able (is a mouth breather)   Acute metabolic encephalopathy -altered mental status Work-up  has been extensive - treated for possible UTI w/o impact - Psychiatry evaluated and recommended a scheduled regimen of Ativan, Haldol, and Cogentin which is being administered - has a complex history of polysubstance abuse -no physiologic findings to suggest withdrawal - hepatic encephalopathy was treated without impact - BUN not severely elevated at this time - neurologic exam is nonfocal -unclear to me if this is metabolic or psychiatric issue -no further episodes to suggest seizure activity - serial CT head nonacute - cont supportive care and follow   Fall without apparent injury Patient suffered an unwitnessed fall in his room 12/16 -physical exam post fall did not reveal any clear evidence of injury/trauma -CT scan head post fall was without acute findings, and repeat remained the same    Acute renal failure No prior history of kidney disease - creatinine continues to fluctuate but overall is stable  History of CAD Continue aspirin and statin  History of CVA Continue usual home medications  DM 2 - uncontrolled with hyperglycemia CBGs well controlled    Gout Continue allopurinol  Bipolar disorder Question the extent to which this is contributing to his current mental status fluctuations - meds per Psychiatry   Seizure disorder Continue Depakote and Vimpat   HTN Blood pressure appears to be improving -follow without change in treatment today  BPH Continue Flomax  Chronic opioid use holding narcoitcs due to altered mental status  Prior history of alcohol abuse Unclear if patient was drinking prior to admit -well beyond the window of withdrawal  Nutrition coretrack feeding tube placed 12/29 -continue tube feeds  DVT prophylaxis: Eliquis Code Status: FULL CODE Family Communication:  Disposition Plan: MedSurg  Consultants:  none  Antimicrobials:  None  Objective: Blood pressure 129/79, pulse (!) 119, temperature 97.7 F (36.5 C), temperature source Axillary, resp.  rate (!) 26, height 5\' 11"  (1.803 m), weight 102.9 kg, SpO2 90 %.  Intake/Output Summary (Last 24 hours) at 10/22/2019 1011 Last data filed at 10/22/2019 0659 Gross per 24 hour  Intake 1200 ml  Output 500 ml  Net 700 ml   Filed Weights   10/03/19 1642 10/10/19 0351  Weight: 95.3 kg 102.9 kg    Examination: General: No acute respiratory distress - sedate - Lungs: Good air movement throughout - mouth breathing - no wheezing  Cardiovascular: RRR  Abdomen: NT/ND, soft, BS+, no rebound Extremities: No C/C/E B LE   CBC: Recent Labs  Lab 10/16/19 0300 10/19/19 0425 10/20/19 0523  WBC 11.3* 9.1 8.8  HGB 14.0 12.9* 13.5  HCT 45.4 43.1 45.7  MCV 88.7 93.1 93.8  PLT 195 236 253   Basic Metabolic Panel: Recent Labs  Lab 10/16/19 0300 10/20/19 0523 10/22/19 0541  NA 143 152* 153*  K 4.7 4.7 4.8  CL 106 118* 114*  CO2 25 24 27   GLUCOSE 99 87 113*  BUN 27* 29* 23  CREATININE 1.10 1.39* 1.15  CALCIUM 9.1 8.3* 8.2*   GFR: Estimated Creatinine Clearance: 79.2 mL/min (by C-G formula based on SCr of 1.15 mg/dL).  Liver Function Tests: Recent Labs  Lab 10/16/19 0300 10/20/19 0523 10/22/19 0541  AST 19 19 21   ALT 19 17 24   ALKPHOS 49 43 47  BILITOT 0.6 0.9 0.3  PROT 7.5 7.3 7.7  ALBUMIN 2.6* 2.5* 2.4*    Recent Labs  Lab 10/20/19 0523  AMMONIA 35    HbA1C: HbA1c, POC (controlled diabetic range)  Date/Time Value Ref Range Status  06/06/2018 03:03 PM 5.7 0.0 - 7.0 % Final   Hgb A1c MFr Bld  Date/Time Value Ref Range Status  08/19/2019 05:41 PM 6.3 (H) 4.8 - 5.6 % Final    Comment:    (NOTE) Pre diabetes:          5.7%-6.4% Diabetes:              >6.4% Glycemic control for   <7.0% adults with diabetes   12/25/2018 08:01 AM 6.3 (H) 4.8 - 5.6 % Final    Comment:    (NOTE) Pre diabetes:          5.7%-6.4% Diabetes:              >6.4% Glycemic control for   <7.0% adults with diabetes     Scheduled Meds: . benztropine  1 mg Per Tube BID  .  Chlorhexidine Gluconate Cloth  6 each Topical Daily  . cloNIDine  0.1 mg Per Tube TID  . enoxaparin (LOVENOX) injection  100 mg Subcutaneous Q12H  . famotidine  20 mg Per Tube Daily  . free water  200 mL Per Tube Q4H  . Ipratropium-Albuterol  1 puff Inhalation Q6H  . lacosamide  100 mg Per Tube BID  . LORazepam  1 mg Per Tube TID  . neomycin-bacitracin-polymyxin   Topical BID  . QUEtiapine  25 mg Per Tube BID  . QUEtiapine  50 mg Per Tube QHS  . sodium chloride flush  10-40 mL Intracatheter Q12H  . tamsulosin  0.4 mg Oral QHS  . valproic acid  1,000 mg Per Tube QHS  . valproic acid  500 mg Per Tube q morning - 10a     LOS: 20 days   10/22/19 T.  Sharon Seller, MD [facility] Office  [phone] Pager - Text Page per Amion  If 7PM-7AM, please contact night-coverage per Amion 10/22/2019, 10:11 AM

## 2019-10-23 ENCOUNTER — Inpatient Hospital Stay (HOSPITAL_COMMUNITY): Payer: Medicare HMO

## 2019-10-23 DIAGNOSIS — J9601 Acute respiratory failure with hypoxia: Secondary | ICD-10-CM

## 2019-10-23 DIAGNOSIS — Z4682 Encounter for fitting and adjustment of non-vascular catheter: Secondary | ICD-10-CM | POA: Diagnosis not present

## 2019-10-23 DIAGNOSIS — I469 Cardiac arrest, cause unspecified: Secondary | ICD-10-CM

## 2019-10-23 DIAGNOSIS — R6521 Severe sepsis with septic shock: Secondary | ICD-10-CM | POA: Diagnosis not present

## 2019-10-23 DIAGNOSIS — R404 Transient alteration of awareness: Secondary | ICD-10-CM | POA: Diagnosis not present

## 2019-10-23 DIAGNOSIS — R918 Other nonspecific abnormal finding of lung field: Secondary | ICD-10-CM | POA: Diagnosis not present

## 2019-10-23 DIAGNOSIS — A419 Sepsis, unspecified organism: Secondary | ICD-10-CM | POA: Diagnosis not present

## 2019-10-23 LAB — POCT I-STAT 7, (LYTES, BLD GAS, ICA,H+H)
Acid-Base Excess: 3 mmol/L — ABNORMAL HIGH (ref 0.0–2.0)
Acid-Base Excess: 8 mmol/L — ABNORMAL HIGH (ref 0.0–2.0)
Acid-Base Excess: 5 mmol/L — ABNORMAL HIGH (ref 0.0–2.0)
Bicarbonate: 26.6 mmol/L (ref 20.0–28.0)
Bicarbonate: 33.1 mmol/L — ABNORMAL HIGH (ref 20.0–28.0)
Bicarbonate: 29.5 mmol/L — ABNORMAL HIGH (ref 20.0–28.0)
Calcium, Ion: 1.11 mmol/L — ABNORMAL LOW (ref 1.15–1.40)
Calcium, Ion: 1.02 mmol/L — ABNORMAL LOW (ref 1.15–1.40)
Calcium, Ion: 1.05 mmol/L — ABNORMAL LOW (ref 1.15–1.40)
HCT: 41 % (ref 39.0–52.0)
HCT: 36 % — ABNORMAL LOW (ref 39.0–52.0)
HCT: 35 % — ABNORMAL LOW (ref 39.0–52.0)
Hemoglobin: 13.9 g/dL (ref 13.0–17.0)
Hemoglobin: 12.2 g/dL — ABNORMAL LOW (ref 13.0–17.0)
Hemoglobin: 11.9 g/dL — ABNORMAL LOW (ref 13.0–17.0)
O2 Saturation: 67 %
O2 Saturation: 99 %
O2 Saturation: 87 %
Patient temperature: 102.3
Patient temperature: 102.7
Patient temperature: 98.6
Potassium: 4.7 mmol/L (ref 3.5–5.1)
Potassium: 5.2 mmol/L — ABNORMAL HIGH (ref 3.5–5.1)
Potassium: 5 mmol/L (ref 3.5–5.1)
Sodium: 149 mmol/L — ABNORMAL HIGH (ref 135–145)
Sodium: 154 mmol/L — ABNORMAL HIGH (ref 135–145)
Sodium: 150 mmol/L — ABNORMAL HIGH (ref 135–145)
TCO2: 28 mmol/L (ref 22–32)
TCO2: 35 mmol/L — ABNORMAL HIGH (ref 22–32)
TCO2: 31 mmol/L (ref 22–32)
pCO2 arterial: 41.6 mmHg (ref 32.0–48.0)
pCO2 arterial: 52.6 mmHg — ABNORMAL HIGH (ref 32.0–48.0)
pCO2 arterial: 42.6 mmHg (ref 32.0–48.0)
pH, Arterial: 7.422 (ref 7.350–7.450)
pH, Arterial: 7.415 (ref 7.350–7.450)
pH, Arterial: 7.449 (ref 7.350–7.450)
pO2, Arterial: 38 mmHg — CL (ref 83.0–108.0)
pO2, Arterial: 138 mmHg — ABNORMAL HIGH (ref 83.0–108.0)
pO2, Arterial: 50 mmHg — ABNORMAL LOW (ref 83.0–108.0)

## 2019-10-23 LAB — URINALYSIS, ROUTINE W REFLEX MICROSCOPIC
Bacteria, UA: NONE SEEN
Bilirubin Urine: NEGATIVE
Glucose, UA: NEGATIVE mg/dL
Ketones, ur: NEGATIVE mg/dL
Leukocytes,Ua: NEGATIVE
Nitrite: NEGATIVE
Protein, ur: 100 mg/dL — AB
RBC / HPF: >50 RBC/hpf — ABNORMAL HIGH (ref 0–5)
Specific Gravity, Urine: 1.026 (ref 1.005–1.030)
pH: 7 (ref 5.0–8.0)

## 2019-10-23 LAB — GLUCOSE, CAPILLARY
Glucose-Capillary: 121 mg/dL — ABNORMAL HIGH (ref 70–99)
Glucose-Capillary: 112 mg/dL — ABNORMAL HIGH (ref 70–99)
Glucose-Capillary: 158 mg/dL — ABNORMAL HIGH (ref 70–99)
Glucose-Capillary: 127 mg/dL — ABNORMAL HIGH (ref 70–99)
Glucose-Capillary: 113 mg/dL — ABNORMAL HIGH (ref 70–99)

## 2019-10-23 LAB — BASIC METABOLIC PANEL
Anion gap: 12 (ref 5–15)
BUN: 22 mg/dL (ref 8–23)
CO2: 25 mmol/L (ref 22–32)
Calcium: 8.1 mg/dL — ABNORMAL LOW (ref 8.9–10.3)
Chloride: 112 mmol/L — ABNORMAL HIGH (ref 98–111)
Creatinine, Ser: 1.44 mg/dL — ABNORMAL HIGH (ref 0.61–1.24)
GFR calc Af Amer: 59 mL/min — ABNORMAL LOW (ref >60)
GFR calc non Af Amer: 51 mL/min — ABNORMAL LOW (ref >60)
Glucose, Bld: 118 mg/dL — ABNORMAL HIGH (ref 70–99)
Potassium: 5.6 mmol/L — ABNORMAL HIGH (ref 3.5–5.1)
Sodium: 149 mmol/L — ABNORMAL HIGH (ref 135–145)

## 2019-10-23 LAB — CBC
HCT: 44.3 % (ref 39.0–52.0)
Hemoglobin: 12.9 g/dL — ABNORMAL LOW (ref 13.0–17.0)
MCH: 27.6 pg (ref 26.0–34.0)
MCHC: 29.1 g/dL — ABNORMAL LOW (ref 30.0–36.0)
MCV: 94.9 fL (ref 80.0–100.0)
Platelets: 192 10*3/uL (ref 150–400)
RBC: 4.67 MIL/uL (ref 4.22–5.81)
RDW: 16.3 % — ABNORMAL HIGH (ref 11.5–15.5)
WBC: 12 10*3/uL — ABNORMAL HIGH (ref 4.0–10.5)
nRBC: 0 % (ref 0.0–0.2)

## 2019-10-23 LAB — PROCALCITONIN: Procalcitonin: 0.29 ng/mL

## 2019-10-23 LAB — MRSA PCR SCREENING: MRSA by PCR: NEGATIVE

## 2019-10-23 LAB — CORTISOL: Cortisol, Plasma: 8.9 ug/dL

## 2019-10-23 MED ORDER — ETOMIDATE 2 MG/ML IV SOLN
40.0000 mg | Freq: Once | INTRAVENOUS | Status: AC
  Administered 2019-10-23: 19:00:00 40 mg via INTRAVENOUS

## 2019-10-23 MED ORDER — HALOPERIDOL LACTATE 5 MG/ML IJ SOLN: 1.0000 mg | Freq: Three times a day (TID) | INTRAMUSCULAR | Status: DC | PRN

## 2019-10-23 MED ORDER — FENTANYL CITRATE (PF) 100 MCG/2ML IJ SOLN: 100.0000 ug | Freq: Once | INTRAMUSCULAR | Status: AC

## 2019-10-23 MED ORDER — PHENYLEPHRINE HCL-NACL 10-0.9 MG/250ML-% IV SOLN
0.0000 ug/min | INTRAVENOUS | Status: DC
  Administered 2019-10-23: 20 ug/min via INTRAVENOUS
  Filled 2019-10-23 (×2): qty 250

## 2019-10-23 MED ORDER — SODIUM CHLORIDE 0.9 % IV BOLUS
1000.0000 mL | Freq: Once | INTRAVENOUS | Status: AC
  Administered 2019-10-23: 1000 mL via INTRAVENOUS

## 2019-10-23 MED ORDER — FENTANYL CITRATE (PF) 100 MCG/2ML IJ SOLN
50.0000 ug | Freq: Once | INTRAMUSCULAR | Status: AC
  Administered 2019-10-23: 100 ug via INTRAVENOUS

## 2019-10-23 MED ORDER — ORAL CARE MOUTH RINSE
15.0000 mL | OROMUCOSAL | Status: DC
  Administered 2019-10-23 – 2019-11-01 (×87): 15 mL via OROMUCOSAL

## 2019-10-23 MED ORDER — LORAZEPAM 2 MG/ML IJ SOLN
INTRAMUSCULAR | Status: AC
  Administered 2019-10-23: 19:00:00 2 mg via INTRAVENOUS
  Filled 2019-10-23: qty 1

## 2019-10-23 MED ORDER — MIDAZOLAM 50MG/50ML (1MG/ML) PREMIX INFUSION
0.0000 mg/h | INTRAVENOUS | Status: DC
  Administered 2019-10-23: 19:00:00 2 mg/h via INTRAVENOUS
  Administered 2019-10-24: 20:00:00 5 mg/h via INTRAVENOUS
  Administered 2019-10-24: 05:00:00 3 mg/h via INTRAVENOUS
  Administered 2019-10-25 (×2): 5 mg/h via INTRAVENOUS
  Administered 2019-10-26: 12:00:00 2 mg/h via INTRAVENOUS
  Administered 2019-10-27: 23:00:00 6 mg/h via INTRAVENOUS
  Administered 2019-10-27: 03:00:00 4 mg/h via INTRAVENOUS
  Administered 2019-10-27: 15:00:00 6 mg/h via INTRAVENOUS
  Administered 2019-10-28: 18:00:00 4 mg/h via INTRAVENOUS
  Administered 2019-10-28 (×2): 8 mg/h via INTRAVENOUS
  Administered 2019-10-29: 4 mg/h via INTRAVENOUS
  Filled 2019-10-23 (×13): qty 50

## 2019-10-23 MED ORDER — APIXABAN 5 MG PO TABS
5.0000 mg | ORAL_TABLET | Freq: Two times a day (BID) | ORAL | Status: DC
  Administered 2019-10-23 – 2019-10-26 (×6): 5 mg
  Filled 2019-10-23 (×6): qty 1

## 2019-10-23 MED ORDER — ETOMIDATE 2 MG/ML IV SOLN
INTRAVENOUS | Status: AC
  Filled 2019-10-23: qty 10

## 2019-10-23 MED ORDER — MIDAZOLAM BOLUS VIA INFUSION
1.0000 mg | INTRAVENOUS | Status: DC | PRN
  Administered 2019-10-26 (×2): 2 mg via INTRAVENOUS
  Filled 2019-10-23: qty 2

## 2019-10-23 MED ORDER — MIDAZOLAM HCL 2 MG/2ML IJ SOLN
INTRAMUSCULAR | Status: AC
  Filled 2019-10-23: qty 2

## 2019-10-23 MED ORDER — ROCURONIUM BROMIDE 10 MG/ML (PF) SYRINGE
PREFILLED_SYRINGE | INTRAVENOUS | Status: AC
  Filled 2019-10-23: qty 10

## 2019-10-23 MED ORDER — VALPROIC ACID 250 MG/5ML PO SOLN
500.0000 mg | Freq: Every day | ORAL | Status: DC
  Administered 2019-10-23 – 2019-10-28 (×6): 500 mg
  Filled 2019-10-23 (×7): qty 10

## 2019-10-23 MED ORDER — FENTANYL CITRATE (PF) 100 MCG/2ML IJ SOLN
INTRAMUSCULAR | Status: AC
  Filled 2019-10-23: qty 2

## 2019-10-23 MED ORDER — SODIUM CHLORIDE 0.9 % IV SOLN
2.0000 g | Freq: Three times a day (TID) | INTRAVENOUS | Status: DC
  Administered 2019-10-24: 02:00:00 2 g via INTRAVENOUS
  Filled 2019-10-23: qty 2

## 2019-10-23 MED ORDER — FENTANYL 2500MCG IN NS 250ML (10MCG/ML) PREMIX INFUSION
50.0000 ug/h | INTRAVENOUS | Status: DC
  Administered 2019-10-23: 19:00:00 50 ug/h via INTRAVENOUS
  Administered 2019-10-24: 15:00:00 150 ug/h via INTRAVENOUS
  Administered 2019-10-25 (×2): 175 ug/h via INTRAVENOUS
  Administered 2019-10-26: 20:00:00 200 ug/h via INTRAVENOUS
  Administered 2019-10-27: 150 ug/h via INTRAVENOUS
  Administered 2019-10-27: 200 ug/h via INTRAVENOUS
  Administered 2019-10-28: 150 ug/h via INTRAVENOUS
  Administered 2019-10-29: 125 ug/h via INTRAVENOUS
  Administered 2019-10-30 (×2): 100 ug/h via INTRAVENOUS
  Administered 2019-11-01 (×2): 175 ug/h via INTRAVENOUS
  Filled 2019-10-23 (×14): qty 250

## 2019-10-23 MED ORDER — FAMOTIDINE IN NACL 20-0.9 MG/50ML-% IV SOLN
20.0000 mg | Freq: Two times a day (BID) | INTRAVENOUS | Status: DC
  Administered 2019-10-23 – 2019-10-25 (×5): 20 mg via INTRAVENOUS
  Filled 2019-10-23 (×6): qty 50

## 2019-10-23 MED ORDER — LORAZEPAM 2 MG/ML IJ SOLN: 2.0000 mg | Freq: Once | INTRAMUSCULAR | Status: AC

## 2019-10-23 MED ORDER — FENTANYL BOLUS VIA INFUSION
50.0000 ug | INTRAVENOUS | Status: DC | PRN
  Administered 2019-10-26 – 2019-11-01 (×7): 50 ug via INTRAVENOUS
  Filled 2019-10-23: qty 50

## 2019-10-23 MED ORDER — CHLORHEXIDINE GLUCONATE 0.12% ORAL RINSE (MEDLINE KIT)
15.0000 mL | Freq: Two times a day (BID) | OROMUCOSAL | Status: DC
  Administered 2019-10-23 – 2019-10-26 (×7): 15 mL via OROMUCOSAL

## 2019-10-23 NOTE — Progress Notes (Signed)
Pt. has a temp of 101.7. Other v/s: 119/76, 119, 28, 99% NRB.  650mg  tylanol administered. Both MD and charge nurse notified.  Will continue to monitor.

## 2019-10-23 NOTE — Procedures (Signed)
Intubation and CPR procedure Note Russell Carter [id number] 10-12-1956  Procedure:  1. Intubation, 2.  CPR Indications: Acute respiratory failure with hypoxemia, PEA/bradycardic arrest likely due to respiratory failure  Procedure Details Consent: Unable to obtain consent because of emergent medical necessity. Time Out: Verified patient identification, verified procedure, site/side was marked, verified correct patient position, special equipment/implants available, medications/allergies/relevent history reviewed, required imaging and test results available.  Performed  Maximum sterile technique was used including cap, gloves, gown, hand hygiene and mask.  MAC 4 glide scope   Procedure details: Patient brought poorly responsive and with evolving hypoxemic respiratory failure to the ICU urgently for evaluation and intubation.  Preoxygenation was performed with SPO2 89-90%.  Meds given: Ativan, fentanyl 100 mcg, etomidate 40 mg. Glottis easily visualized with laryngoscope, dried tenacious secretions were successfully suctioned and removed from the visual field.  7.5 ET tube placed easily on first pass under direct visualization and secured at 23 cm upper incisors.  Good chest rise.  Despite intubation and bag ventilation patient experienced progressive desaturation, presumed due to derecruitment.  Adequate position of the ET tube was confirmed with a relook by the laryngoscope.  Patient devolved into bradycardia and then PEA arrest.  CPR immediately initiated, bag ventilation was continued.  Patient received epinephrine x1, sodium bicarbonate x2.  ROSC achieved after 3 minutes.  Saturations improved.  Presume that this was a primary respiratory arrest leading to cardiopulmonary collapse.   Evaluation Hemodynamic Status: Instability requiring CPR as detailed above; O2 sats: Fell during procedure despite straightforward intubation Patient's Current Condition: stable Complications: Complications of  Desaturation, transient PEA as described Patient did not tolerate procedure well. Chest X-ray ordered to verify placement.  CXR: pending.   [name] 10/23/2019

## 2019-10-23 NOTE — Progress Notes (Addendum)
Patient with increased restlessness.  Unable to get an accurate oxygen saturation. Saturation reading as low as 48 on monitor.  Multiple probes applied.  Resp therapy called for assistance.  Splitter placed on Oxygen on wall to give patient non-rebreather and high flow at the same time.  Patient saturation and work of breathing increased.  Rectal temperature 102.3.  Resp therapy and ICU charge to bedside.  Patient transferred to ICU accompanied by RT and ICU RN.  Physician contacted and aware of change in patient status.  Report given to Barbara Cower RN at bedside,.

## 2019-10-23 NOTE — Progress Notes (Signed)
1825: Pt arrived to unit accompanied by Charge RNs and RTs. Pt has altered mental status not following commands. ST in the 140s. Tachypneic on Non-rebreather mask with oxygen saturations in the 80s.Cortrak in place.    1828: Dr. Delton Coombes and Dr. Sharon Seller at bedside. Pt intubated and connected to ventilator.  100 mcg fentanyl, 2 mg Ativan, 40 mg of Etomidate given. IV bolus of NS started.   1834: Pt went bradycardic, to the 30s. CPR started. ROSC achieved at 1838 with ST rhythm in the 130s.

## 2019-10-23 NOTE — Progress Notes (Signed)
Decreased patients tida lvolume to 2ml/kg due to plat pressures greater than 40cm. Also increased peep to 16cm due to Sp20=82-84%. Driving Pressure at 18 now with Plat pressures 33cm. Will obtain abg.

## 2019-10-23 NOTE — Progress Notes (Signed)
Russell Carter  ZDG:[id number] DOB: 01-24-1956 DOA: 10-21-2019 PCP: Ladell Pier, MD    Brief Narrative:  64 y.o.malewith medical history significant for CAD, osteoarthritis, polysubstance abuse, PE on chronic anticoagulation, diabetes, cocaine abuse, hyperlipidemia, cervical stenosis, anxiety disorder,hypertension, chronic kidney disease, seizure disorder, CVA, andbipolar disorderwho was recently admitted with COVID pneumonia, who returned with worsening symptoms.  He was discharged on 12/8.  He was no longer hypoxic.  His inflammatory markers have become normal.  Due to hypoxia patient was hospitalized for further management.  Significant Events: 12/4 > 12/8 admit [facility] - tx w/ steroids + remdesivir - d/c home on RA 12/11 re-admit via [facility] 12/14 mental status beginning to decline  12/16 unwitnessed fall in room - f/u CT head unrevealing  12/23 Psychiatry consulted   COVID-19 specific Treatment: Remdesivir course #2 12/12 > 12/16 Decadron course #2 12/11 > 12/19  Antimicrobials:  Rocephin 12/25 > 12/29  Subjective: Patient developed a fever of 101.7 early this morning, and then again 102.4 at 7 AM.  Other vital signs were stable.  Much more sedate today.  Less interactive.  Mild respiratory distress appreciated.  FUO work-up ordered.  Assessment & Plan:  FUO - Worsening B "infiltrates" on CXR Etiology unclear - edema v/s infectious - TTE March 2020 w/ preserved ED - ?aspiration pneumonitis - ?post COVID fibrotic change -blood cultures and urine culture sent today - MRSA screen negative - UA pending - procalcitonin marginal at 0.29 - will initiate empiric abx for HCAP for now   Hypernatremia  Could be contributing to AMS - increased free water via Cortrak   COVID Pneumonia - acute hypoxic respiratory failure Has completed a second course of remdesivir - Decadron discontinued out of fear it was contributing to his acute delirium - wean oxygen as able (is a  mouth breather) - this is not felt to be directly contributing to his current illness  Acute metabolic encephalopathy -altered mental status Work-up has been extensive - treated for possible UTI w/o impact - Psychiatry evaluated and recommended a scheduled regimen of Ativan, Haldol, and Cogentin which is being administered - has a complex history of polysubstance abuse -no physiologic findings to suggest withdrawal - hepatic encephalopathy was treated without impact - BUN not severely elevated at this time - neurologic exam is nonfocal -unclear to me if this is metabolic or psychiatric issue -no further episodes to suggest seizure activity - serial CT head nonacute - cont supportive care and follow   Fall without apparent injury Patient suffered an unwitnessed fall in his room 12/16 -physical exam post fall did not reveal any clear evidence of injury/trauma -CT scan head post fall was without acute findings, and repeat remained the same    Acute renal failure No prior history of kidney disease - creatinine continues to fluctuate but overall is stable  History of CAD Continue aspirin and statin  History of CVA Continue usual home medications  DM 2 - uncontrolled with hyperglycemia CBGs well controlled    Gout Continue allopurinol  Bipolar disorder Question the extent to which this is contributing to his current mental status fluctuations - meds per Psychiatry   Seizure disorder Continue Depakote and Vimpat   HTN Blood pressure appears to be improving -follow without change in treatment today  BPH Continue Flomax  Chronic opioid use holding narcoitcs due to altered mental status  Prior history of alcohol abuse Unclear if patient was drinking prior to admit -well beyond the window of  withdrawal  Nutrition coretrack feeding tube placed 12/29 -continue tube feeds  DVT prophylaxis: Eliquis Code Status: FULL CODE Family Communication:  Disposition Plan: MedSurg  Consultants:   none  Objective: Blood pressure 138/81, pulse (!) 133, temperature (!) 102.4 F (39.1 C), temperature source Axillary, resp. rate (!) 22, height 5\' 11"  (1.803 m), weight 102.9 kg, SpO2 94 %.  Intake/Output Summary (Last 24 hours) at 10/23/2019 1004 Last data filed at 10/23/2019 0700 Gross per 24 hour  Intake 1440 ml  Output 550 ml  Net 890 ml   Filed Weights   10/03/19 1642 10/10/19 0351  Weight: 95.3 kg 102.9 kg    Examination: General: Sedate lethargic Lungs: Good air movement throughout - mouth breathing  Cardiovascular: Tachycardic but regular Abdomen: NT/ND, soft, BS+, no rebound Extremities: No C/C/E LE  CBC: Recent Labs  Lab 10/19/19 0425 10/20/19 0523 10/23/19 0538  WBC 9.1 8.8 12.0*  HGB 12.9* 13.5 12.9*  HCT 43.1 45.7 44.3  MCV 93.1 93.8 94.9  PLT 236 253 192   Basic Metabolic Panel: Recent Labs  Lab 10/20/19 0523 10/22/19 0541 10/23/19 0538  NA 152* 153* 149*  K 4.7 4.8 5.6*  CL 118* 114* 112*  CO2 24 27 25   GLUCOSE 87 113* 118*  BUN 29* 23 22  CREATININE 1.39* 1.15 1.44*  CALCIUM 8.3* 8.2* 8.1*   GFR: Estimated Creatinine Clearance: 63.3 mL/min (A) (by C-G formula based on SCr of 1.44 mg/dL (H)).  Liver Function Tests: Recent Labs  Lab 10/20/19 0523 10/22/19 0541  AST 19 21  ALT 17 24  ALKPHOS 43 47  BILITOT 0.9 0.3  PROT 7.3 7.7  ALBUMIN 2.5* 2.4*    Recent Labs  Lab 10/20/19 0523  AMMONIA 35    HbA1C: HbA1c, POC (controlled diabetic range)  Date/Time Value Ref Range Status  06/06/2018 03:03 PM 5.7 0.0 - 7.0 % Final   Hgb A1c MFr Bld  Date/Time Value Ref Range Status  08/19/2019 05:41 PM 6.3 (H) 4.8 - 5.6 % Final    Comment:    (NOTE) Pre diabetes:          5.7%-6.4% Diabetes:              >6.4% Glycemic control for   <7.0% adults with diabetes   12/25/2018 08:01 AM 6.3 (H) 4.8 - 5.6 % Final    Comment:    (NOTE) Pre diabetes:          5.7%-6.4% Diabetes:              >6.4% Glycemic control for   <7.0% adults  with diabetes     Scheduled Meds: . apixaban  5 mg Per Tube BID  . benztropine  1 mg Per Tube BID  . Chlorhexidine Gluconate Cloth  6 each Topical Daily  . cloNIDine  0.1 mg Per Tube TID  . famotidine  20 mg Per Tube Daily  . free water  300 mL Per Tube Q4H  . Ipratropium-Albuterol  1 puff Inhalation Q6H  . lacosamide  100 mg Per Tube BID  . LORazepam  0.5 mg Per Tube TID  . neomycin-bacitracin-polymyxin   Topical BID  . QUEtiapine  25 mg Per Tube QHS  . sodium chloride flush  10-40 mL Intracatheter Q12H  . tamsulosin  0.4 mg Oral QHS  . valproic acid  1,000 mg Per Tube QHS  . valproic acid  500 mg Per Tube q morning - 10a     LOS: 21 days  Lonia Blood, MD [facility] Office  [phone] Pager - Text Page per Amion  If 7PM-7AM, please contact night-coverage per Amion 10/23/2019, 10:04 AM

## 2019-10-23 NOTE — Progress Notes (Signed)
eLink Physician-Brief Progress Note Patient Name: Russell Carter DOB: 12-Mar-1956 MRN: [id number]   Date of Service  10/23/2019  HPI/Events of Note  Hypotension - BP = 81/57 with MAP = 65.   eICU Interventions  Will order: 1. Monitor CVP now and Q 4 hours.  2. Phenylephrine IV infusion. Titrate to MAP >= 65.      Intervention Category Major Interventions: Hypotension - evaluation and management  Timiyah Romito Eugene 10/23/2019, 9:08 PM

## 2019-10-23 DEATH — deceased

## 2019-10-24 DIAGNOSIS — A419 Sepsis, unspecified organism: Secondary | ICD-10-CM

## 2019-10-24 DIAGNOSIS — R6521 Severe sepsis with septic shock: Secondary | ICD-10-CM

## 2019-10-24 DIAGNOSIS — R404 Transient alteration of awareness: Secondary | ICD-10-CM

## 2019-10-24 LAB — COMPREHENSIVE METABOLIC PANEL
ALT: 20 U/L (ref 0–44)
AST: 33 U/L (ref 15–41)
Albumin: 1.8 g/dL — ABNORMAL LOW (ref 3.5–5.0)
Alkaline Phosphatase: 42 U/L (ref 38–126)
Anion gap: 8 (ref 5–15)
BUN: 28 mg/dL — ABNORMAL HIGH (ref 8–23)
CO2: 27 mmol/L (ref 22–32)
Calcium: 6.8 mg/dL — ABNORMAL LOW (ref 8.9–10.3)
Chloride: 113 mmol/L — ABNORMAL HIGH (ref 98–111)
Creatinine, Ser: 1.71 mg/dL — ABNORMAL HIGH (ref 0.61–1.24)
GFR calc Af Amer: 48 mL/min — ABNORMAL LOW (ref >60)
GFR calc non Af Amer: 41 mL/min — ABNORMAL LOW (ref >60)
Glucose, Bld: 118 mg/dL — ABNORMAL HIGH (ref 70–99)
Potassium: 4.6 mmol/L (ref 3.5–5.1)
Sodium: 148 mmol/L — ABNORMAL HIGH (ref 135–145)
Total Bilirubin: 0.6 mg/dL (ref 0.3–1.2)
Total Protein: 5.9 g/dL — ABNORMAL LOW (ref 6.5–8.1)

## 2019-10-24 LAB — CBC
HCT: 38.3 % — ABNORMAL LOW (ref 39.0–52.0)
Hemoglobin: 11.3 g/dL — ABNORMAL LOW (ref 13.0–17.0)
MCH: 28.4 pg (ref 26.0–34.0)
MCHC: 29.5 g/dL — ABNORMAL LOW (ref 30.0–36.0)
MCV: 96.2 fL (ref 80.0–100.0)
Platelets: 197 10*3/uL (ref 150–400)
RBC: 3.98 MIL/uL — ABNORMAL LOW (ref 4.22–5.81)
RDW: 16.2 % — ABNORMAL HIGH (ref 11.5–15.5)
WBC: 12.5 10*3/uL — ABNORMAL HIGH (ref 4.0–10.5)
nRBC: 0 % (ref 0.0–0.2)

## 2019-10-24 LAB — GLUCOSE, CAPILLARY
Glucose-Capillary: 102 mg/dL — ABNORMAL HIGH (ref 70–99)
Glucose-Capillary: 117 mg/dL — ABNORMAL HIGH (ref 70–99)
Glucose-Capillary: 124 mg/dL — ABNORMAL HIGH (ref 70–99)
Glucose-Capillary: 128 mg/dL — ABNORMAL HIGH (ref 70–99)
Glucose-Capillary: 82 mg/dL (ref 70–99)
Glucose-Capillary: 91 mg/dL (ref 70–99)

## 2019-10-24 LAB — URINE CULTURE: Culture: NO GROWTH

## 2019-10-24 LAB — AMMONIA: Ammonia: 32 umol/L (ref 9–35)

## 2019-10-24 LAB — PROCALCITONIN: Procalcitonin: 5.83 ng/mL

## 2019-10-24 MED ORDER — INSULIN ASPART 100 UNIT/ML ~~LOC~~ SOLN: 4.0000 [IU] | SUBCUTANEOUS | Status: DC

## 2019-10-24 MED ORDER — IPRATROPIUM-ALBUTEROL 0.5-2.5 (3) MG/3ML IN SOLN: 3.0000 mL | Freq: Four times a day (QID) | RESPIRATORY_TRACT | Status: DC | PRN

## 2019-10-24 MED ORDER — PIPERACILLIN-TAZOBACTAM 3.375 G IVPB
3.3750 g | Freq: Three times a day (TID) | INTRAVENOUS | Status: AC
  Administered 2019-10-24 – 2019-10-30 (×20): 3.375 g via INTRAVENOUS
  Filled 2019-10-24 (×21): qty 50

## 2019-10-24 MED ORDER — PHENYLEPHRINE CONCENTRATED 100MG/250ML (0.4 MG/ML) INFUSION SIMPLE
0.0000 ug/min | INTRAVENOUS | Status: DC
  Administered 2019-10-24: 80 ug/min via INTRAVENOUS
  Administered 2019-10-24: 200 ug/min via INTRAVENOUS
  Administered 2019-10-25: 100 ug/min via INTRAVENOUS
  Administered 2019-10-27: 50 ug/min via INTRAVENOUS
  Administered 2019-10-28 (×2): 250 ug/min via INTRAVENOUS
  Filled 2019-10-24 (×5): qty 250

## 2019-10-24 MED ORDER — INSULIN ASPART 100 UNIT/ML ~~LOC~~ SOLN: 2.0000 [IU] | SUBCUTANEOUS | Status: DC

## 2019-10-24 MED ORDER — DEXTROSE 10 % IV SOLN
INTRAVENOUS | Status: DC
Start: 2019-10-24 — End: 2019-10-24

## 2019-10-24 MED FILL — Medication: Qty: 1 | Status: AC

## 2019-10-24 NOTE — Progress Notes (Signed)
Son updated through phone call of pt's condition. All questions were answered.

## 2019-10-24 NOTE — Progress Notes (Signed)
Per plan with Provider Byrum today, [name] was taped ton his Neo to lowest possible dose. He was tolerant of fentanyl and versed sedation and maintained synchrony with the vent set at 70% FIO2. [name] was restless earlier this am and it was felt that his bilateral soft wrist restraints could be discontinued and replaced with bilateral hand mitts. He was able to tolerate the mitts and was not combative. He continues on his tube feeding and his glucose was monitored and did not require additional coverage today. [name] had minimal urine output today and his bladder scans showed minimal production - it was reported by the provider that due to his cardiogenic shock we would not see an increase in urine output today. Other than the stated above no other changes were noted. [name] son called today and was updated on his care. He asked if he could visit his dad at the hospital and instructions were given that he could not physically come but video chat could be done if he felt like it.

## 2019-10-24 NOTE — Consult Note (Signed)
NAME:  Russell Carter, MRN:  [id number], DOB:  05-26-1956, LOS: 82 ADMISSION DATE:  10/16/2019, CONSULTATION DATE: 1/1 REFERRING MD: Dr. Thereasa Solo, CHIEF COMPLAINT: Acute respiratory failure  Brief History     History of present illness   64 year old man with coronary disease, hypertension, osteoarthritis, polysubstance abuse, PE requiring chronic anticoagulation, diabetes, seizure disorder and psychiatric disorder (bipolar).  Recent hospitalization 12/4-12/8 for Covid pneumonia, discharged on room air.  Readmitted 12/11 with persistent infiltrates, dyspnea, hypoxemia, altered mental status.  Psych evaluation recommended Cogentin, Haldol, Ativan.  He was treated a second time with remdesivir and Decadron (completed).  Also treated with ceftriaxone for presumed superimposed bacterial pneumonia and UTI.  Has had waxing and waning mental status, encephalopathy during the hospitalization.  Fever over the last 2 days with some progression of work of breathing and hypoxemia.  On 1/1 his work of breathing, hypoxemia, encephalopathy progressively worsened to the point of evolving respiratory failure.  He was moved urgently to the ICU and required intubation.  That course was complicated by desaturation after tube placement and brief PEA arrest (3 minutes).  He is ventilated, has had shock overnight started on phenylephrine.  Past Medical History   has a past medical history of Acute kidney injury ([place]) (04/27/2018), AKI (acute kidney injury) ([place]), Anginal pain ([place]), Anxiety, Arthritis, Bipolar disorder ([place]), Blood in stool, CAD (coronary artery disease), Cervical stenosis of spine, Chronic anticoagulation (04/27/2018), Chronic pain disorder (06/05/2017), CKD (chronic kidney disease), stage III (05/06/2018), CKD (chronic kidney disease), stage III (05/06/2018), Cocaine abuse ([place]), Complete heart block ([place]) (03/14/2016), Depression, Diabetes mellitus type 2 in nonobese ([place]), Diabetes mellitus without complication  ([place]), Dyslipidemia (03/15/2016), Essential hypertension (05/18/2009), Gastroesophageal reflux disease without esophagitis (07/31/2017), Gout, unspecified, Headache, History of CVA (cerebrovascular accident), History of pulmonary embolus (PE) (02/14/2017), HTN (hypertension), Hypercholesterolemia, Hyperglycemia, Leukopenia (05/06/2018), Migraines, Myocardial infarction ([place]) (2008), Normocytic anemia (05/06/2018), Pneumonia, Polysubstance abuse ([place]) (01/15/2013), Rectal bleeding, Renal insufficiency, Seizure ([place]) (05/06/2018), Seizures ([place]), Stroke ([place]), and Vertigo.   Significant Hospital Events   Treated for COVID-19 pneumonia 12/4-12/8 [name], discharged on room air 12/11 readmit via [facility] ED, treated again with remdesivir and Decadron 12/14 evolving encephalopathy 12/16 unwitnessed fall, subsequent CT head unrevealing 12/23 psych consult, Ativan, Cogentin, Haldol recommended 12/31 more sedate, less interactive, some evolving respiratory distress, fevers 1/1 continued encephalopathy, fever, increased work of breathing 1/1 intubation, brief PEA arrest   Consults:  Psych  Procedures:  ET tube 1/1 >>   Significant Diagnostic Tests:  Head CT 12/16 >> no acute abnormality Head CT 12/22 >> no change, no acute abnormality KUB 12/29 >> feeding tube placed appropriately in the stomach  Micro Data:  MRSA screen 1/1 >> negative Urine 12/26 >> negative Urine 12/27 >> negative Urine 1/1 >> Blood 1/1 >>  Respiratory 1/2 >>   Antimicrobials:  Remdesivir course #2 12/12 >> 12/16 Dexamethasone course #2 12/11 >> 12/19 Ceftriaxone 12/25 >> 12/19 Cefepime 1/1 >> 1/2 Zosyn 1/2 >>   Interim history/subjective:  Required initiation of pressors overnight, phenylephrine currently at 80 Versed 3, fent 150 FiO2 0.70, PEEP 14.  Tidal volumes changed to 6 cc/kg based on elevated plateau pressures    Objective   Blood pressure 102/71, pulse 100, temperature 100.2 F (37.9 C), temperature  source Axillary, resp. rate (!) 25, height '5\' 11"'$  (1.803 m), weight 102.9 kg, SpO2 94 %. CVP:  [12 mmHg-19 mmHg] 18 mmHg  Vent Mode: PRVC FiO2 (%):  [70 %-100 %] 70 % Set Rate:  [28 bmp]  28 bmp Vt Set:  [440 mL-520 mL] 440 mL PEEP:  [14 cmH20-16 cmH20] 14 cmH20 Plateau Pressure:  [33 cmH20-34 cmH20] 34 cmH20   Intake/Output Summary (Last 24 hours) at 10/24/2019 3710 Last data filed at 10/24/2019 0800 Gross per 24 hour  Intake 1501.78 ml  Output 350 ml  Net 1151.78 ml   Filed Weights   10/03/19 1642 10/10/19 0351  Weight: 95.3 kg 102.9 kg    Examination: General: Thin acute and chronically ill-appearing man HENT: ET tube in good position, dried secretions in his posterior pharynx prior to intubation Lungs: Coarse bilaterally, inspiratory crackles, no wheezing Cardiovascular: Tachycardic, regular, distant Abdomen: Nondistended, positive bowel sounds, NG tube in place Extremities: No significant edema Neuro: GCS 4, does not respond to pain or voice, no spontaneous movement noted  Resolved Hospital Problem list   COVID-19 pneumonia  Assessment & Plan:  Acute respiratory failure with hypoxemia.  Due to new bilateral infiltrates and presumed aspiration pneumonia/HCAP superimposed on his residual changes from recent Covid pneumonia Given bilateral infiltrates, evolving acute lung injury plan to ventilate with 6 cc/kg.  Wean PEEP and FiO2 as able Goal plateau pressure less than 30, driving pressure less than 15 Deep sedation until gas exchange improves VAP prevention order set Diuresis if/when he can tolerate, goal CVP 5-8 Given presumed aspiration favor changing cefepime to Zosyn 1/2, follow culture data and tailor appropriately  Shock, presumed septic shock, source new pneumonia.  Consider also contribution from sedating medications Currently on phenylephrine.  Wean as able Antibiotics as above  Acute renal failure, associated transient hyperkalemia (improved) Follow BMP, urine  output Support renal perfusion, avoid nephrotoxins  Hypernatremia Free water as ordered per tube  Diabetes mellitus type 2, poorly controlled with hyperglycemia Add ICU hyperglycemia protocol on 1/2  Seizure disorder, history of alcohol abuse (uncertain if active) Continue home Depakote, Vimpat May need to consider EEG depending on mental status, any suspicion for occult seizure activity  Toxic metabolic encephalopathy.  Multifactorial.  Likely due to infection, metabolic disarray superimposed on underlying psychiatric disease Need for sedation for mechanical ventilation PAD protocol, Versed, fentanyl infusions  History of pulmonary embolism.  He has been fully anticoagulated with Eliquis during the hospitalization, doubt recurrent VTE Continue Eliquis as ordered  History of hypertension Hold maintenance antihypertensives for now  History of CAD Continue aspirin, statin as ordered  Nutrition Continue tube feeding    Best practice:  Diet: TF Pain/Anxiety/Delirium protocol (if indicated): Fentanyl, Versed VAP protocol (if indicated): Ordered DVT prophylaxis: Therapeutic Eliquis GI prophylaxis: Pepcid Glucose control: Start ICU Hg protocol 1/2 Mobility: BR Code Status: Full Family Communication: Family notified by Dr. Thereasa Solo on 1/1 PM Disposition: ICU  Labs   CBC: Recent Labs  Lab 10/19/19 0425 10/20/19 0523 10/23/19 0538 10/23/19 1809 10/23/19 1842 10/23/19 2107 10/24/19 0426  WBC 9.1 8.8 12.0*  --   --   --  12.5*  HGB 12.9* 13.5 12.9* 13.9 12.2* 11.9* 11.3*  HCT 43.1 45.7 44.3 41.0 36.0* 35.0* 38.3*  MCV 93.1 93.8 94.9  --   --   --  96.2  PLT 236 253 192  --   --   --  626    Basic Metabolic Panel: Recent Labs  Lab 10/20/19 0523 10/22/19 0541 10/23/19 0538 10/23/19 1809 10/23/19 1842 10/23/19 2107 10/24/19 0426  NA 152* 153* 149* 149* 154* 150* 148*  K 4.7 4.8 5.6* 4.7 5.2* 5.0 4.6  CL 118* 114* 112*  --   --   --  113*  CO2 '24 27 25  '$ --    --   --  27  GLUCOSE 87 113* 118*  --   --   --  118*  BUN 29* 23 22  --   --   --  28*  CREATININE 1.39* 1.15 1.44*  --   --   --  1.71*  CALCIUM 8.3* 8.2* 8.1*  --   --   --  6.8*   GFR: Estimated Creatinine Clearance: 53.3 mL/min (A) (by C-G formula based on SCr of 1.71 mg/dL (H)). Recent Labs  Lab 10/19/19 0425 10/20/19 0523 10/23/19 0538 10/24/19 0426  PROCALCITON  --   --  0.29 5.83  WBC 9.1 8.8 12.0* 12.5*    Liver Function Tests: Recent Labs  Lab 10/20/19 0523 10/22/19 0541 10/24/19 0426  AST 19 21 33  ALT '17 24 20  '$ ALKPHOS 43 47 42  BILITOT 0.9 0.3 0.6  PROT 7.3 7.7 5.9*  ALBUMIN 2.5* 2.4* 1.8*   No results for input(s): LIPASE, AMYLASE in the last 168 hours. Recent Labs  Lab 10/20/19 0523 10/24/19 0427  AMMONIA 35 32    ABG    Component Value Date/Time   PHART 7.449 10/23/2019 2107   PCO2ART 42.6 10/23/2019 2107   PO2ART 50.0 (L) 10/23/2019 2107   HCO3 29.5 (H) 10/23/2019 2107   TCO2 31 10/23/2019 2107   ACIDBASEDEF 1.0 09/25/2019 2143   O2SAT 87.0 10/23/2019 2107     Coagulation Profile: No results for input(s): INR, PROTIME in the last 168 hours.  Cardiac Enzymes: No results for input(s): CKTOTAL, CKMB, CKMBINDEX, TROPONINI in the last 168 hours.  HbA1C: HbA1c, POC (controlled diabetic range)  Date/Time Value Ref Range Status  06/06/2018 03:03 PM 5.7 0.0 - 7.0 % Final   Hgb A1c MFr Bld  Date/Time Value Ref Range Status  08/19/2019 05:41 PM 6.3 (H) 4.8 - 5.6 % Final    Comment:    (NOTE) Pre diabetes:          5.7%-6.4% Diabetes:              >6.4% Glycemic control for   <7.0% adults with diabetes   12/25/2018 08:01 AM 6.3 (H) 4.8 - 5.6 % Final    Comment:    (NOTE) Pre diabetes:          5.7%-6.4% Diabetes:              >6.4% Glycemic control for   <7.0% adults with diabetes     CBG: Recent Labs  Lab 10/23/19 1622 10/23/19 2013 10/23/19 2349 10/24/19 0410 10/24/19 0753  GLUCAP 127* 113* 124* 102* 82    Review of  Systems:   Unable to give  Past Medical History  He,  has a past medical history of Acute kidney injury ([place]) (04/27/2018), AKI (acute kidney injury) ([place]), Anginal pain ([place]), Anxiety, Arthritis, Bipolar disorder ([place]), Blood in stool, CAD (coronary artery disease), Cervical stenosis of spine, Chronic anticoagulation (04/27/2018), Chronic pain disorder (06/05/2017), CKD (chronic kidney disease), stage III (05/06/2018), CKD (chronic kidney disease), stage III (05/06/2018), Cocaine abuse ([place]), Complete heart block ([place]) (03/14/2016), Depression, Diabetes mellitus type 2 in nonobese ([place]), Diabetes mellitus without complication ([place]), Dyslipidemia (03/15/2016), Essential hypertension (05/18/2009), Gastroesophageal reflux disease without esophagitis (07/31/2017), Gout, unspecified, Headache, History of CVA (cerebrovascular accident), History of pulmonary embolus (PE) (02/14/2017), HTN (hypertension), Hypercholesterolemia, Hyperglycemia, Leukopenia (05/06/2018), Migraines, Myocardial infarction ([place]) (2008), Normocytic anemia (05/06/2018), Pneumonia, Polysubstance abuse ([place]) (01/15/2013), Rectal bleeding, Renal insufficiency, Seizure ([place]) (  05/06/2018), Seizures ([place]), Stroke ([place]), and Vertigo.   Surgical History    Past Surgical History:  Procedure Laterality Date  . ANTERIOR CERVICAL DECOMP/DISCECTOMY FUSION N/A 01/24/2017   Procedure: Cervical Four-Five,Cervical Five Six,Cervical Six-Seven Anterior cervical discectomy with fusion and plate with Cervical Three-Thoracic- One Laminectomy/Foraminotomy with Cervical Three-Thoracic- One Fixation and fusion;  Surgeon: Kevan Ny Ditty, MD;  Location: [facility];  Service: Neurosurgery;  Laterality: N/A;  Part-1 anterior approach  . CARDIAC CATHETERIZATION  2008; ~ 2011   [place]-40%lad  . CARDIAC CATHETERIZATION N/A 03/16/2016   Procedure: Left Heart Cath and Coronary Angiography;  Surgeon: Leonie Man, MD;  Location: [facility];  Service: Cardiovascular;   Laterality: N/A;  . COLONOSCOPY WITH PROPOFOL N/A 04/17/2018   Procedure: COLONOSCOPY WITH PROPOFOL;  Surgeon: Wonda Horner, MD;  Location: [facility] ENDOSCOPY;  Service: Endoscopy;  Laterality: N/A;  . CORONARY ANGIOPLASTY     STENTS  . EYE SURGERY Bilateral    "for double vision"  . INGUINAL HERNIA REPAIR  05/12/2012   Procedure: HERNIA REPAIR INGUINAL ADULT;  Surgeon: Zenovia Jarred, MD;  Location: [facility];  Service: General;  Laterality: Right;  . INGUINAL HERNIA REPAIR Left 02/02/2003   Dr. Georganna Skeans. repair with mesh. Same procedure done again in  05/22/2004   . INGUINAL HERNIA REPAIR Left ?date 2nd OR  . LEFT HEART CATH AND CORONARY ANGIOGRAPHY N/A 04/29/2018   Procedure: LEFT HEART CATH AND CORONARY ANGIOGRAPHY;  Surgeon: Martinique, Peter M, MD;  Location: [facility];  Service: Cardiovascular;  Laterality: N/A;  . POSTERIOR CERVICAL FUSION/FORAMINOTOMY N/A 01/24/2017   Procedure: Cervical Three-Thoracic One-Laminectomy/Foraminotomy with Cervical Three-Thoracic One Fixation and fusion;  Surgeon: Kevan Ny Ditty, MD;  Location: [facility];  Service: Neurosurgery;  Laterality: N/A;  Part-2 Posterior approach  . RECONSTRUCT / STABILIZE DISTAL ULNA    . SHOULDER OPEN ROTATOR CUFF REPAIR Left 2010  . SHOULDER SURGERY Left    "injured on the job; had to cut small bone out"  . THUMB FUSION Right      Social History   reports that he has never smoked. He has never used smokeless tobacco. He reports previous alcohol use. He reports previous drug use. Drugs: "Crack" cocaine, Marijuana, and Cocaine.   Family History   His family history includes Bone cancer in his brother; Cancer in his brother and brother; Heart attack in his brother, brother, father, and mother; Other in his brother and brother; SIDS in his sister.   Allergies Allergies  Allergen Reactions  . Shellfish Allergy Anaphylaxis     Home Medications  Prior to Admission medications   Medication Sig Start  Date End Date Taking? Authorizing Provider  allopurinol (ZYLOPRIM) 300 MG tablet TAKE 1 TABLET (300 MG TOTAL) BY MOUTH DAILY. 08/28/19  Yes Ladell Pier, MD  amLODipine (NORVASC) 5 MG tablet Take 1 tablet (5 mg total) by mouth daily. 12/18/18  Yes Ladell Pier, MD  apixaban (ELIQUIS) 5 MG TABS tablet Take 1 tablet (5 mg total) by mouth 2 (two) times daily. 09/07/19  Yes Elsie Stain, MD  aspirin EC 81 MG tablet Take 1 tablet (81 mg total) by mouth daily. 12/26/18 12/26/19 Yes Vonzella Nipple, NP  Calcium Carb-Cholecalciferol (CALCIUM-VITAMIN D) 600-400 MG-UNIT TABS Take 1 tablet by mouth daily. 05/07/18  Yes Emokpae, Courage, MD  [employer] MAGNESIUM OXIDE 250 MG TABS Take 250 mg by mouth at bedtime. 09/22/19  Yes [provider]  cyclobenzaprine (FLEXERIL) 10  MG tablet Take 10 mg by mouth 3 (three) times daily. 08/26/19  Yes [provider]  divalproex (DEPAKOTE) 500 MG DR tablet 1 tablet in the morning, 2 in the evening Patient taking differently: Take 500-1,000 mg by mouth See admin instructions. Take 1 tablet in the morning and 2 in the evening 06/02/19  Yes Kathrynn Ducking, MD  DULoxetine (CYMBALTA) 30 MG capsule TAKE 1 CAPSULE (30 MG TOTAL) BY MOUTH 2 (TWO) TIMES DAILY. 08/28/19  Yes Ladell Pier, MD  famotidine (PEPCID) 20 MG tablet Take 20 mg by mouth 2 (two) times daily. 08/26/19  Yes [provider]  folic acid (FOLVITE) 1 MG tablet Take 1 tablet (1 mg total) by mouth daily. 05/07/18  Yes Emokpae, Courage, MD  gabapentin (NEURONTIN) 100 MG capsule Take 2 capsules (200 mg total) by mouth 2 (two) times daily. 05/15/19 04/04/20 Yes Domenic Polite, MD  Lacosamide 100 MG TABS Take 1 tablet (100 mg total) by mouth 2 (two) times daily. 09/07/19 10/07/19 Yes Elsie Stain, MD  losartan (COZAAR) 100 MG tablet TAKE 1 TABLET (100 MG TOTAL) BY MOUTH DAILY. 08/28/19  Yes Ladell Pier, MD  metoCLOPramide (REGLAN) 10 MG tablet Take 10 mg by mouth 3 (three) times  daily before meals.  08/28/19  Yes [provider]  Multiple Vitamin (MULTIVITAMIN WITH MINERALS) TABS tablet Take 1 tablet by mouth daily. 05/07/18  Yes Roxan Hockey, MD  NARCAN 4 MG/0.1ML LIQD nasal spray kit Place 1 spray into the nose once.  02/12/19  Yes [provider]  nitroGLYCERIN (NITROSTAT) 0.4 MG SL tablet Place 1 tablet (0.4 mg total) under the tongue every 5 (five) minutes as needed for chest pain. 02/20/19  Yes Ladell Pier, MD  oxyCODONE-acetaminophen (PERCOCET) 7.5-325 MG tablet Take 1 tablet by mouth 3 (three) times daily. 12/26/18 12/26/19 Yes Vonzella Nipple, NP  rosuvastatin (CRESTOR) 40 MG tablet Take 1 tablet (40 mg total) by mouth daily. 04/21/19  Yes Ladell Pier, MD  tamsulosin (FLOMAX) 0.4 MG CAPS capsule TAKE 1 CAPSULE BY MOUTH AT BEDTIME. Patient taking differently: Take 0.4 mg by mouth at bedtime.  08/28/19  Yes Ladell Pier, MD  testosterone cypionate (DEPOTESTOSTERONE CYPIONATE) 200 MG/ML injection Inject 200 mg into the muscle every 30 (thirty) days. 08/23/19  Yes [provider]  thiamine (VITAMIN B-1) 100 MG tablet Take 1 tablet (100 mg total) by mouth daily. 09/07/19  Yes Elsie Stain, MD  Vitamin D, Ergocalciferol, (DRISDOL) 1.25 MG (50000 UT) CAPS capsule Take 50,000 Units by mouth every Sunday.    Yes [provider]  chlordiazePOXIDE (LIBRIUM) 10 MG capsule Take one capsule three times day for one week, then one capsule twice a day for one week then one capsule daily until supply is out. Patient not taking: Reported on 10/06/2019 09/07/19   Elsie Stain, MD     Critical care time: 6    Baltazar Apo, MD, PhD 10/24/2019, 10:12 AM [facility] [phone] or if no answer [phone]

## 2019-10-24 NOTE — Progress Notes (Signed)
Contacted [facility] for increasing hypotension and pressor need. Phenylephrine now at 200 mcg/min

## 2019-10-24 NOTE — Plan of Care (Signed)
  Problem: Clinical Measurements: Goal: Respiratory complications will improve Outcome: Progressing Goal: Cardiovascular complication will be avoided Outcome: Progressing   Problem: Activity: Goal: Risk for activity intolerance will decrease Outcome: Progressing   Problem: Nutrition: Goal: Adequate nutrition will be maintained Outcome: Progressing   Problem: Elimination: Goal: Will not experience complications related to bowel motility Outcome: Progressing   Problem: Education: Goal: Knowledge of risk factors and measures for prevention of condition will improve Outcome: Not Progressing   Problem: Coping: Goal: Psychosocial and spiritual needs will be supported Outcome: Not Progressing   Problem: Respiratory: Goal: Will maintain a patent airway Outcome: Not Progressing Goal: Complications related to the disease process, condition or treatment will be avoided or minimized Outcome: Not Progressing   Problem: Education: Goal: Knowledge of General Education information will improve Description: Including pain rating scale, medication(s)/side effects and non-pharmacologic comfort measures Outcome: Not Progressing   Problem: Health Behavior/Discharge Planning: Goal: Ability to manage health-related needs will improve Outcome: Not Progressing   Problem: Clinical Measurements: Goal: Ability to maintain clinical measurements within normal limits will improve Outcome: Not Progressing Goal: Will remain free from infection Outcome: Not Progressing Goal: Diagnostic test results will improve Outcome: Not Progressing

## 2019-10-24 NOTE — Progress Notes (Signed)
Pharmacy Antibiotic Note  Russell Carter is a 64 y.o. male admitted on 10/15/2019 with COVID-19 pneumonia.  He previously completed ceftriaxone, and was re-started on Cefepime yesterday for empiric HCAP coverage.  S/p respiratory arrest 1/1, emergent intubation, leading to cardiopulmonary collapse.  PEA/bradycardic arrest requiring CPR.  Pharmacy has been consulted for Zosyn dosing for aspiration pneumonia.    Plan: Zosyn 3.375g IV Q8H infused over 4hrs. Follow up renal function, cultures, clinical progress.  Height: 5\' 11"  (180.3 cm) Weight: 226 lb 13.7 oz (102.9 kg) IBW/kg (Calculated) : 75.3  Temp (24hrs), Avg:100.5 F (38.1 C), Min:98.5 F (36.9 C), Max:103.3 F (39.6 C)  Recent Labs  Lab 10/19/19 0425 10/20/19 0523 10/22/19 0541 10/23/19 0538 10/24/19 0426  WBC 9.1 8.8  --  12.0* 12.5*  CREATININE  --  1.39* 1.15 1.44* 1.71*    Estimated Creatinine Clearance: 53.3 mL/min (A) (by C-G formula based on SCr of 1.71 mg/dL (H)).    Allergies  Allergen Reactions  . Shellfish Allergy Anaphylaxis    Antimicrobials this admission: 12/12 Remdesivir >> 12/16 12/25 Ceftriaxone >> 12/29 1/1 Cefepime >> 1/2 1/2 Zosyn >>   Dose adjustments this admission:  Microbiology results: 12/11 MRSA PCR: neg 12/26 UCx: <10k colonies, insignificant 12/27 UCx: NGF 1/1 MRSA PCR: neg 1/1 BCx: ngtd 1/1 UCx:  TA:   Thank you for allowing pharmacy to be a part of this patient's care.  1/28 PharmD, BCPS Clinical pharmacist phone 7am- 5pm: [phone] 10/24/2019 10:02 AM

## 2019-10-25 ENCOUNTER — Inpatient Hospital Stay (HOSPITAL_COMMUNITY): Payer: Medicare HMO

## 2019-10-25 DIAGNOSIS — I469 Cardiac arrest, cause unspecified: Secondary | ICD-10-CM | POA: Diagnosis not present

## 2019-10-25 DIAGNOSIS — R404 Transient alteration of awareness: Secondary | ICD-10-CM | POA: Diagnosis not present

## 2019-10-25 DIAGNOSIS — J9601 Acute respiratory failure with hypoxia: Secondary | ICD-10-CM | POA: Diagnosis not present

## 2019-10-25 DIAGNOSIS — R6521 Severe sepsis with septic shock: Secondary | ICD-10-CM | POA: Diagnosis not present

## 2019-10-25 DIAGNOSIS — A419 Sepsis, unspecified organism: Secondary | ICD-10-CM | POA: Diagnosis not present

## 2019-10-25 LAB — COMPREHENSIVE METABOLIC PANEL
ALT: 18 U/L (ref 0–44)
AST: 29 U/L (ref 15–41)
Albumin: 1.5 g/dL — ABNORMAL LOW (ref 3.5–5.0)
Alkaline Phosphatase: 46 U/L (ref 38–126)
Anion gap: 8 (ref 5–15)
BUN: 32 mg/dL — ABNORMAL HIGH (ref 8–23)
CO2: 26 mmol/L (ref 22–32)
Calcium: 6.6 mg/dL — ABNORMAL LOW (ref 8.9–10.3)
Chloride: 113 mmol/L — ABNORMAL HIGH (ref 98–111)
Creatinine, Ser: 1.74 mg/dL — ABNORMAL HIGH (ref 0.61–1.24)
GFR calc Af Amer: 47 mL/min — ABNORMAL LOW (ref >60)
GFR calc non Af Amer: 41 mL/min — ABNORMAL LOW (ref >60)
Glucose, Bld: 113 mg/dL — ABNORMAL HIGH (ref 70–99)
Potassium: 4.9 mmol/L (ref 3.5–5.1)
Sodium: 147 mmol/L — ABNORMAL HIGH (ref 135–145)
Total Bilirubin: 0.3 mg/dL (ref 0.3–1.2)
Total Protein: 6 g/dL — ABNORMAL LOW (ref 6.5–8.1)

## 2019-10-25 LAB — HEMOGLOBIN A1C
Hgb A1c MFr Bld: 6.7 % — ABNORMAL HIGH (ref 4.8–5.6)
Mean Plasma Glucose: 145.59 mg/dL

## 2019-10-25 LAB — GLUCOSE, CAPILLARY
Glucose-Capillary: 113 mg/dL — ABNORMAL HIGH (ref 70–99)
Glucose-Capillary: 114 mg/dL — ABNORMAL HIGH (ref 70–99)
Glucose-Capillary: 119 mg/dL — ABNORMAL HIGH (ref 70–99)
Glucose-Capillary: 121 mg/dL — ABNORMAL HIGH (ref 70–99)
Glucose-Capillary: 122 mg/dL — ABNORMAL HIGH (ref 70–99)
Glucose-Capillary: 128 mg/dL — ABNORMAL HIGH (ref 70–99)
Glucose-Capillary: 138 mg/dL — ABNORMAL HIGH (ref 70–99)

## 2019-10-25 LAB — CBC
HCT: 33.8 % — ABNORMAL LOW (ref 39.0–52.0)
Hemoglobin: 9.6 g/dL — ABNORMAL LOW (ref 13.0–17.0)
MCH: 27.7 pg (ref 26.0–34.0)
MCHC: 28.4 g/dL — ABNORMAL LOW (ref 30.0–36.0)
MCV: 97.4 fL (ref 80.0–100.0)
Platelets: 162 10*3/uL (ref 150–400)
RBC: 3.47 MIL/uL — ABNORMAL LOW (ref 4.22–5.81)
RDW: 16.4 % — ABNORMAL HIGH (ref 11.5–15.5)
WBC: 7.5 10*3/uL (ref 4.0–10.5)
nRBC: 0 % (ref 0.0–0.2)

## 2019-10-25 LAB — LACTIC ACID, PLASMA: Lactic Acid, Venous: 1.7 mmol/L (ref 0.5–1.9)

## 2019-10-25 MED ORDER — INSULIN ASPART 100 UNIT/ML ~~LOC~~ SOLN
0.0000 [IU] | SUBCUTANEOUS | Status: DC
  Administered 2019-10-25 – 2019-10-29 (×16): 1 [IU] via SUBCUTANEOUS
  Administered 2019-10-30: 16:00:00 2 [IU] via SUBCUTANEOUS
  Administered 2019-10-30 – 2019-11-01 (×8): 1 [IU] via SUBCUTANEOUS

## 2019-10-25 NOTE — Progress Notes (Addendum)
NAME:  Russell Carter, MRN:  [id number], DOB:  14-Jan-1956, LOS: 60 ADMISSION DATE:  10/10/2019, CONSULTATION DATE: 1/1 REFERRING MD: Dr. Thereasa Solo, CHIEF COMPLAINT: Acute respiratory failure  Brief History     History of present illness   64 year old man with coronary disease, hypertension, osteoarthritis, polysubstance abuse, PE requiring chronic anticoagulation, diabetes, seizure disorder and psychiatric disorder (bipolar).  Recent hospitalization 12/4-12/8 for Covid pneumonia, discharged on room air.  Readmitted 12/11 with persistent infiltrates, dyspnea, hypoxemia, altered mental status.  Psych evaluation recommended Cogentin, Haldol, Ativan.  He was treated a second time with remdesivir and Decadron (completed).  Also treated with ceftriaxone for presumed superimposed bacterial pneumonia and UTI.  Has had waxing and waning mental status, encephalopathy during the hospitalization.  Fever over the last 2 days with some progression of work of breathing and hypoxemia.  On 1/1 his work of breathing, hypoxemia, encephalopathy progressively worsened to the point of evolving respiratory failure.  He was moved urgently to the ICU and required intubation.  That course was complicated by desaturation after tube placement and brief PEA arrest (3 minutes).  He is ventilated, has had shock overnight started on phenylephrine.  Past Medical History   has a past medical history of Acute kidney injury ([place]) (04/27/2018), AKI (acute kidney injury) ([place]), Anginal pain ([place]), Anxiety, Arthritis, Bipolar disorder ([place]), Blood in stool, CAD (coronary artery disease), Cervical stenosis of spine, Chronic anticoagulation (04/27/2018), Chronic pain disorder (06/05/2017), CKD (chronic kidney disease), stage III (05/06/2018), CKD (chronic kidney disease), stage III (05/06/2018), Cocaine abuse ([place]), Complete heart block ([place]) (03/14/2016), Depression, Diabetes mellitus type 2 in nonobese ([place]), Diabetes mellitus without complication  ([place]), Dyslipidemia (03/15/2016), Essential hypertension (05/18/2009), Gastroesophageal reflux disease without esophagitis (07/31/2017), Gout, unspecified, Headache, History of CVA (cerebrovascular accident), History of pulmonary embolus (PE) (02/14/2017), HTN (hypertension), Hypercholesterolemia, Hyperglycemia, Leukopenia (05/06/2018), Migraines, Myocardial infarction ([place]) (2008), Normocytic anemia (05/06/2018), Pneumonia, Polysubstance abuse ([place]) (01/15/2013), Rectal bleeding, Renal insufficiency, Seizure ([place]) (05/06/2018), Seizures ([place]), Stroke ([place]), and Vertigo.   Significant Hospital Events   Treated for COVID-19 pneumonia 12/4-12/8 [name], discharged on room air 12/11 readmit via [facility] long ED, treated again with remdesivir and Decadron 12/14 evolving encephalopathy 12/16 unwitnessed fall, subsequent CT head unrevealing 12/23 psych consult, Ativan, Cogentin, Haldol recommended 12/31 more sedate, less interactive, some evolving respiratory distress, fevers 1/1 continued encephalopathy, fever, increased work of breathing 1/1 intubation, brief PEA arrest   Consults:  Psych  Procedures:  ET tube 1/1 >>   Significant Diagnostic Tests:  Head CT 12/16 >> no acute abnormality Head CT 12/22 >> no change, no acute abnormality KUB 12/29 >> feeding tube placed appropriately in the stomach  Micro Data:  MRSA screen 1/1 >> negative Urine 12/26 >> negative Urine 12/27 >> negative Urine 1/1 >> Blood 1/1 >>  Respiratory 1/2 >>   Antimicrobials:  Remdesivir course #2 12/12 >> 12/16 Dexamethasone course #2 12/11 >> 12/19 Ceftriaxone 12/25 >> 12/19 Cefepime 1/1 >> 1/2 Zosyn 1/2 >>   Interim history/subjective:  Required initiation of pressors overnight, phenylephrine currently at 100 Versed 5, fent 175 FiO2 0.50, PEEP 12.  Tidal volumes changed to 6 cc/kg based on elevated plateau pressures    Objective   Blood pressure 99/70, pulse (!) 102, temperature 99.1 F (37.3 C), temperature  source Oral, resp. rate (!) 28, height 5\' 11"  (1.803 m), weight 102.9 kg, SpO2 96 %. CVP:  [10 mmHg-19 mmHg] 13 mmHg  Vent Mode: PRVC FiO2 (%):  [60 %-70 %] 60 % Set Rate:  [28  bmp] 28 bmp Vt Set:  [440 mL] 440 mL PEEP:  [12 cmH20-14 cmH20] 12 cmH20 Plateau Pressure:  [28 cmH20-33 cmH20] 32 cmH20   Intake/Output Summary (Last 24 hours) at 10/25/2019 0751 Last data filed at 10/25/2019 0700 Gross per 24 hour  Intake 2851.47 ml  Output 2105 ml  Net 746.47 ml   Filed Weights   10/03/19 1642 10/10/19 0351  Weight: 95.3 kg 102.9 kg    Examination: General: T critically ill man, sedated, intubated HENT: ET tube in place, oropharynx otherwise clear Lungs: Coarse crackles bilaterally, no wheeze Cardiovascular: Regular, distant, no murmur Abdomen: Nondistended, positive bowel sounds Extremities: No edema, left BKA Neuro: Sedated, does not respond to pain or voice, no spontaneous movement noted  Resolved Hospital Problem list   COVID-19 pneumonia  Assessment & Plan:  Acute respiratory failure with hypoxemia.  Due to new bilateral infiltrates and presumed aspiration pneumonia/HCAP superimposed on his residual changes from recent Covid pneumonia Bilateral infiltrates, suspected aspiration pneumonia, aspiration injury.  Continue ARDS ventilator strategy, 6 cc/kg Wean PEEP and FiO2 as able Goal plateau pressure <30, driving pressure <25 Diuresis as he can tolerate, goal CVP 5-8 Continue Zosyn, follow culture data and tailor to results, pending  Shock, presumed septic shock, source new pneumonia.  Consider also contribution from sedating medications Wean phenylephrine as able, goal MAP 65 Antibiotics as above Minimize sedation when gas exchange will allow  Acute renal failure, associated transient hyperkalemia (improved) Follow BMP, urine output Avoid nephrotoxins Support renal perfusion  Hypernatremia Free water as ordered per tube, 300 cc every 4 hours  Diabetes mellitus type 2,  poorly controlled with hyperglycemia ICU hyperglycemia protocol  Seizure disorder, history of alcohol abuse (uncertain if active) Continue home Depakote, Vimpat Consider EEG depending on mental status, any suspicion for seizure activity  Toxic metabolic encephalopathy.  Multifactorial.  Likely due to infection, metabolic disarray superimposed on underlying psychiatric disease Need for sedation for mechanical ventilation Continue PAD protocol, Versed, fentanyl infusions  History of pulmonary embolism.  He has been fully anticoagulated with Eliquis during the hospitalization, doubt recurrent VTE With worsening renal function plan to transition Eliquis to heparin infusion  History of hypertension Maintenance antihypertensives on hold  History of CAD Continue aspirin, statin  Nutrition Tube feeding as ordered    Best practice:  Diet: TF Pain/Anxiety/Delirium protocol (if indicated): Fentanyl, Versed VAP protocol (if indicated): Ordered DVT prophylaxis: Therapeutic Eliquis GI prophylaxis: Pepcid Glucose control: ICU Hg protocol 1/2 Mobility: BR Code Status: Full Family Communication:  I spoke with the patient's son, updated completely 1/3 Disposition: ICU  Labs   CBC: Recent Labs  Lab 10/19/19 0425 10/20/19 0523 10/23/19 0538 10/23/19 1809 10/23/19 1842 10/23/19 2107 10/24/19 0426 10/25/19 0419  WBC 9.1 8.8 12.0*  --   --   --  12.5* 7.5  HGB 12.9* 13.5 12.9* 13.9 12.2* 11.9* 11.3* 9.6*  HCT 43.1 45.7 44.3 41.0 36.0* 35.0* 38.3* 33.8*  MCV 93.1 93.8 94.9  --   --   --  96.2 97.4  PLT 236 253 192  --   --   --  197 162    Basic Metabolic Panel: Recent Labs  Lab 10/20/19 0523 10/22/19 0541 10/23/19 0538 10/23/19 1809 10/23/19 1842 10/23/19 2107 10/24/19 0426 10/25/19 0419  NA 152* 153* 149* 149* 154* 150* 148* 147*  K 4.7 4.8 5.6* 4.7 5.2* 5.0 4.6 4.9  CL 118* 114* 112*  --   --   --  113* 113*  CO2 24 27  25  --   --   --  27 26  GLUCOSE 87 113* 118*   --   --   --  118* 113*  BUN 29* 23 22  --   --   --  28* 32*  CREATININE 1.39* 1.15 1.44*  --   --   --  1.71* 1.74*  CALCIUM 8.3* 8.2* 8.1*  --   --   --  6.8* 6.6*   GFR: Estimated Creatinine Clearance: 52.4 mL/min (A) (by C-G formula based on SCr of 1.74 mg/dL (H)). Recent Labs  Lab 10/20/19 0523 10/23/19 0538 10/24/19 0426 10/25/19 0419 10/25/19 0424  PROCALCITON  --  0.29 5.83  --   --   WBC 8.8 12.0* 12.5* 7.5  --   LATICACIDVEN  --   --   --   --  1.7    Liver Function Tests: Recent Labs  Lab 10/20/19 0523 10/22/19 0541 10/24/19 0426 10/25/19 0419  AST 19 21 33 29  ALT 17 24 20 18   ALKPHOS 43 47 42 46  BILITOT 0.9 0.3 0.6 0.3  PROT 7.3 7.7 5.9* 6.0*  ALBUMIN 2.5* 2.4* 1.8* 1.5*   No results for input(s): LIPASE, AMYLASE in the last 168 hours. Recent Labs  Lab 10/20/19 0523 10/24/19 0427  AMMONIA 35 32    ABG    Component Value Date/Time   PHART 7.449 10/23/2019 2107   PCO2ART 42.6 10/23/2019 2107   PO2ART 50.0 (L) 10/23/2019 2107   HCO3 29.5 (H) 10/23/2019 2107   TCO2 31 10/23/2019 2107   ACIDBASEDEF 1.0 09/25/2019 2143   O2SAT 87.0 10/23/2019 2107     Coagulation Profile: No results for input(s): INR, PROTIME in the last 168 hours.  Cardiac Enzymes: No results for input(s): CKTOTAL, CKMB, CKMBINDEX, TROPONINI in the last 168 hours.  HbA1C: HbA1c, POC (controlled diabetic range)  Date/Time Value Ref Range Status  06/06/2018 03:03 PM 5.7 0.0 - 7.0 % Final   Hgb A1c MFr Bld  Date/Time Value Ref Range Status  08/19/2019 05:41 PM 6.3 (H) 4.8 - 5.6 % Final    Comment:    (NOTE) Pre diabetes:          5.7%-6.4% Diabetes:              >6.4% Glycemic control for   <7.0% adults with diabetes   12/25/2018 08:01 AM 6.3 (H) 4.8 - 5.6 % Final    Comment:    (NOTE) Pre diabetes:          5.7%-6.4% Diabetes:              >6.4% Glycemic control for   <7.0% adults with diabetes     CBG: Recent Labs  Lab 10/24/19 2018 10/25/19 0010  10/25/19 0247 10/25/19 0334 10/25/19 0720  GLUCAP 117* 138* 114* 128* 113*    Critical care time: 33 minutes    12/23/19, MD, PhD 10/25/2019, 7:51 AM [facility] [phone] or if no answer [phone]

## 2019-10-25 NOTE — Progress Notes (Signed)
Straight cath done at 2100 and 0300 with urine volume of 975 ml and 520 ml respectively.

## 2019-10-25 NOTE — Progress Notes (Signed)
SLP Cancellation Note  Patient Details Name: Russell Carter MRN: [id number] DOB: 12/21/1955   Cancelled treatment:       Reason Eval/Treat Not Completed: Other (comment). Pt now intubated. Will sign off and await new orders.    Klaira Pesci, Riley Nearing 10/25/2019, 3:07 PM

## 2019-10-26 LAB — POCT I-STAT 7, (LYTES, BLD GAS, ICA,H+H)
Acid-Base Excess: 5 mmol/L — ABNORMAL HIGH (ref 0.0–2.0)
Bicarbonate: 30.1 mmol/L — ABNORMAL HIGH (ref 20.0–28.0)
Calcium, Ion: 1.04 mmol/L — ABNORMAL LOW (ref 1.15–1.40)
HCT: 30 % — ABNORMAL LOW (ref 39.0–52.0)
Hemoglobin: 10.2 g/dL — ABNORMAL LOW (ref 13.0–17.0)
O2 Saturation: 94 %
Potassium: 5 mmol/L (ref 3.5–5.1)
Sodium: 147 mmol/L — ABNORMAL HIGH (ref 135–145)
TCO2: 32 mmol/L (ref 22–32)
pCO2 arterial: 47.7 mmHg (ref 32.0–48.0)
pH, Arterial: 7.408 (ref 7.350–7.450)
pO2, Arterial: 70 mmHg — ABNORMAL LOW (ref 83.0–108.0)

## 2019-10-26 LAB — CULTURE, RESPIRATORY W GRAM STAIN: Culture: NORMAL

## 2019-10-26 LAB — GLUCOSE, CAPILLARY
Glucose-Capillary: 102 mg/dL — ABNORMAL HIGH (ref 70–99)
Glucose-Capillary: 117 mg/dL — ABNORMAL HIGH (ref 70–99)
Glucose-Capillary: 119 mg/dL — ABNORMAL HIGH (ref 70–99)
Glucose-Capillary: 122 mg/dL — ABNORMAL HIGH (ref 70–99)
Glucose-Capillary: 123 mg/dL — ABNORMAL HIGH (ref 70–99)
Glucose-Capillary: 82 mg/dL (ref 70–99)

## 2019-10-26 LAB — APTT: aPTT: 43 seconds — ABNORMAL HIGH (ref 24–36)

## 2019-10-26 MED ORDER — HEPARIN (PORCINE) 25000 UT/250ML-% IV SOLN: 1500.0000 [IU]/h | INTRAVENOUS | Status: DC

## 2019-10-26 MED ORDER — FAMOTIDINE 40 MG/5ML PO SUSR
20.0000 mg | Freq: Two times a day (BID) | ORAL | Status: DC
  Administered 2019-10-26 – 2019-11-01 (×12): 20 mg
  Filled 2019-10-26 (×12): qty 2.5

## 2019-10-26 MED ORDER — HEPARIN (PORCINE) 25000 UT/250ML-% IV SOLN
1450.0000 [IU]/h | INTRAVENOUS | Status: DC
  Administered 2019-10-26 – 2019-10-27 (×2): 1500 [IU]/h via INTRAVENOUS
  Administered 2019-10-27: 16:00:00 1650 [IU]/h via INTRAVENOUS
  Administered 2019-10-28: 09:00:00 1750 [IU]/h via INTRAVENOUS
  Administered 2019-10-29: 2300 [IU]/h via INTRAVENOUS
  Administered 2019-10-29: 1900 [IU]/h via INTRAVENOUS
  Administered 2019-10-30: 23:00:00 1600 [IU]/h via INTRAVENOUS
  Administered 2019-10-30: 01:00:00 2600 [IU]/h via INTRAVENOUS
  Filled 2019-10-26 (×8): qty 250

## 2019-10-26 NOTE — Progress Notes (Signed)
Versed increased, patient bucking at the vent, HR 120, RR 29

## 2019-10-26 NOTE — Progress Notes (Signed)
RN notified pharmacy of Heparin gtt order and no PTT  Or heparin labs. STAT labs placed and sent. RN will coordinate with pharmacy once labs result. Gtt is not running at this time

## 2019-10-26 NOTE — Progress Notes (Signed)
Patient Fentanyl increased to 75 due to double stacking on vent, Tachypnea, and O2 dropped to 79. RT notified and arrived at bedside. Vitals stable, patient 97%

## 2019-10-26 NOTE — Progress Notes (Signed)
NAME:  Russell Carter, MRN:  [id number], DOB:  [birth year], LOS: 24 ADMISSION DATE:  10/12/2019, CONSULTATION DATE: 1/1 REFERRING MD: Dr. Sharon Seller, CHIEF COMPLAINT: Acute respiratory failure  Brief History     History of present illness   64 year old man with coronary disease, hypertension, osteoarthritis, polysubstance abuse, PE requiring chronic anticoagulation, diabetes, seizure disorder and psychiatric disorder (bipolar).  Recent hospitalization 12/4-12/8 for Covid pneumonia, discharged on room air.  Readmitted 12/11 with persistent infiltrates, dyspnea, hypoxemia, altered mental status.  Psych evaluation recommended Cogentin, Haldol, Ativan.  He was treated a second time with remdesivir and Decadron (completed).  Also treated with ceftriaxone for presumed superimposed bacterial pneumonia and UTI.  Has had waxing and waning mental status, encephalopathy during the hospitalization.  Fever over the last 2 days with some progression of work of breathing and hypoxemia.  On 1/1 his work of breathing, hypoxemia, encephalopathy progressively worsened to the point of evolving respiratory failure.  He was moved urgently to the ICU and required intubation.  That course was complicated by desaturation after tube placement and brief PEA arrest (3 minutes).    Past Medical History   has a past medical history of Acute kidney injury ([facility]) (04/27/2018), AKI (acute kidney injury) ([facility]), Anginal pain ([facility]), Anxiety, Arthritis, Bipolar disorder ([facility]), Blood in stool, CAD (coronary artery disease), Cervical stenosis of spine, Chronic anticoagulation (04/27/2018), Chronic pain disorder (06/05/2017), CKD (chronic kidney disease), stage III (05/06/2018), CKD (chronic kidney disease), stage III (05/06/2018), Cocaine abuse ([facility]), Complete heart block ([facility]) (03/14/2016), Depression, Diabetes mellitus type 2 in nonobese ([facility]), Diabetes mellitus without complication ([facility]), Dyslipidemia (03/15/2016), Essential hypertension (05/18/2009),  Gastroesophageal reflux disease without esophagitis (07/31/2017), Gout, unspecified, Headache, History of CVA (cerebrovascular accident), History of pulmonary embolus (PE) (02/14/2017), HTN (hypertension), Hypercholesterolemia, Hyperglycemia, Leukopenia (05/06/2018), Migraines, Myocardial infarction ([facility]) (2008), Normocytic anemia (05/06/2018), Pneumonia, Polysubstance abuse ([facility]) (01/15/2013), Rectal bleeding, Renal insufficiency, Seizure ([facility]) (05/06/2018), Seizures ([facility]), Stroke ([facility]), and Vertigo.   Significant Hospital Events   Treated for COVID-19 pneumonia 12/4-12/8 [name], discharged on room air 12/11 readmit via [name] long ED, treated again with remdesivir and Decadron 12/14 evolving encephalopathy 12/16 unwitnessed fall, subsequent CT head unrevealing 12/23 psych consult, Ativan, Cogentin, Haldol recommended 12/31 more sedate, less interactive, some evolving respiratory distress, fevers 1/1 continued encephalopathy, fever, increased work of breathing 1/1 intubation, brief PEA arrest   Consults:  Psych  Procedures:  ET tube 1/1 >>   Significant Diagnostic Tests:  Head CT 12/16 >> no acute abnormality Head CT 12/22 >> no change, no acute abnormality KUB 12/29 >> feeding tube placed appropriately in the stomach  Micro Data:  MRSA screen 1/1 >> negative Urine 12/26 >> negative Urine 12/27 >> negative Urine 1/1 >> negative Blood 1/1 >>  Respiratory 1/2 >> few GPC >> normal flora  Antimicrobials:  Remdesivir course #2 12/12 >> 12/16 Dexamethasone course #2 12/11 >> 12/19 Ceftriaxone 12/25 >> 12/19 Cefepime 1/1 >> 1/2 Zosyn 1/2 >>   Interim history/subjective:  Patient sedation was weaned this morning, apparently no significant interaction but biting his ET tube, began to have some double breaths on mechanical ventilation FiO2 0.50, PEEP 10 I/O  6.5 L positive total No labs available yet today  Objective   Blood pressure 119/77, pulse (!) 103, temperature 98.5 F (36.9 C),  temperature source Axillary, resp. rate (!) 28, height 5\' 11"  (1.803 m), weight 102.9 kg, SpO2 93 %. CVP:  [11 mmHg-22 mmHg] 11 mmHg  Vent Mode: PRVC FiO2 (%):  [50 %-60 %] 50 %  Set Rate:  [28 bmp] 28 bmp Vt Set:  [440 mL] 440 mL PEEP:  [10 WGN56-21 cmH20] 10 cmH20 Plateau Pressure:  [28 cmH20-32 cmH20] 28 cmH20   Intake/Output Summary (Last 24 hours) at 10/26/2019 0808 Last data filed at 10/26/2019 3086 Gross per 24 hour  Intake 2800.09 ml  Output 1050 ml  Net 1750.09 ml   Filed Weights   10/03/19 1642 10/10/19 0351  Weight: 95.3 kg 102.9 kg    Examination: General: Critically ill man, sedated, intubated HENT: ET tube in good position, oropharynx otherwise clear, poor dentition Lungs: Inspiratory crackles bilaterally, no wheezing Cardiovascular: Regular, distant, no murmur Abdomen: Nondistended, positive bowel sounds Extremities: Left BKA, no edema Neuro: Deeply sedated, RASS -4, flickers eyes, some grimace with sternal rub.  Per RN he moved lower extremities earlier when his sedation was lightened  Resolved Hospital Problem list   COVID-19 pneumonia  Assessment & Plan:  Acute respiratory failure with hypoxemia.  Due to new bilateral infiltrates and presumed aspiration pneumonia/HCAP superimposed on his residual changes from recent Covid pneumonia Bilateral infiltrates, suspected aspiration pneumonia, aspiration injury.  Plan to continue current ARDS ventilator strategy, 6 cc/kg Goal plateau pressure less than 30, driving pressure less than 15 Wean PEEP and FiO2 as able Has required deep sedation, agree with lightening some on 1/4 to see if he can tolerate Diuresis as he can tolerate, defer on 1/4 until after labs reviewed Continue Zosyn for presumed aspiration pneumonia.  Follow culture data, note GPC on respiratory culture.  Defer vancomycin as MRSA screen on 1/1 was negative  Shock, presumed septic shock, source new pneumonia.  Consider also contribution from sedating  medications Wean phenylephrine as able, goal MAP 65 Antibiotics as above Minimize sedation on 1/4 to see if he can tolerate, if this helps his hemodynamics  Acute renal failure, associated transient hyperkalemia (improved) Follow BMP, urine output Avoid nephrotoxins and support renal perfusion  Hypernatremia Continue Free water as ordered per tube, 300 cc every 4 hours  Diabetes mellitus type 2, poorly controlled with hyperglycemia ICU hyperglycemia protocol  Seizure disorder, history of alcohol abuse (uncertain if active) On his home Depakote, Vimpat without any evidence of active seizures Consider EEG if neurological status changes, suspicion for seizure activity changes  Toxic metabolic encephalopathy.  Multifactorial.  Likely due to infection, metabolic disarray superimposed on underlying psychiatric disease Need for sedation for mechanical ventilation Versed, fentanyl infusions as per PAD protocol  History of pulmonary embolism.  He has been fully anticoagulated with Eliquis during the hospitalization, doubt recurrent VTE Continue heparin infusion.  On Eliquis as an outpatient  History of hypertension Hold maintenance antihypertensive at this time  History of CAD Continue aspirin, statin  Nutrition Continue tube feeding as ordered    Best practice:  Diet: TF Pain/Anxiety/Delirium protocol (if indicated): Fentanyl, Versed VAP protocol (if indicated): Ordered DVT prophylaxis: Therapeutic heparin GI prophylaxis: Pepcid Glucose control: ICU Hg protocol 1/2 Mobility: BR Code Status: Full Family Communication:  I spoke with the patient's son Jodeci, updated him 1/4 Disposition: ICU  Labs   CBC: Recent Labs  Lab 10/20/19 0523 10/23/19 0538 10/23/19 1809 10/23/19 1842 10/23/19 2107 10/24/19 0426 10/25/19 0419  WBC 8.8 12.0*  --   --   --  12.5* 7.5  HGB 13.5 12.9* 13.9 12.2* 11.9* 11.3* 9.6*  HCT 45.7 44.3 41.0 36.0* 35.0* 38.3* 33.8*  MCV 93.8 94.9  --    --   --  96.2 97.4  PLT 253 192  --   --   --  197 162    Basic Metabolic Panel: Recent Labs  Lab 10/20/19 0523 10/22/19 0541 10/23/19 0538 10/23/19 1809 10/23/19 1842 10/23/19 2107 10/24/19 0426 10/25/19 0419  NA 152* 153* 149* 149* 154* 150* 148* 147*  K 4.7 4.8 5.6* 4.7 5.2* 5.0 4.6 4.9  CL 118* 114* 112*  --   --   --  113* 113*  CO2 24 27 25   --   --   --  27 26  GLUCOSE 87 113* 118*  --   --   --  118* 113*  BUN 29* 23 22  --   --   --  28* 32*  CREATININE 1.39* 1.15 1.44*  --   --   --  1.71* 1.74*  CALCIUM 8.3* 8.2* 8.1*  --   --   --  6.8* 6.6*   GFR: Estimated Creatinine Clearance: 52.4 mL/min (A) (by C-G formula based on SCr of 1.74 mg/dL (H)). Recent Labs  Lab 10/20/19 0523 10/23/19 0538 10/24/19 0426 10/25/19 0419 10/25/19 0424  PROCALCITON  --  0.29 5.83  --   --   WBC 8.8 12.0* 12.5* 7.5  --   LATICACIDVEN  --   --   --   --  1.7    Liver Function Tests: Recent Labs  Lab 10/20/19 0523 10/22/19 0541 10/24/19 0426 10/25/19 0419  AST 19 21 33 29  ALT 17 24 20 18   ALKPHOS 43 47 42 46  BILITOT 0.9 0.3 0.6 0.3  PROT 7.3 7.7 5.9* 6.0*  ALBUMIN 2.5* 2.4* 1.8* 1.5*   No results for input(s): LIPASE, AMYLASE in the last 168 hours. Recent Labs  Lab 10/20/19 0523 10/24/19 0427  AMMONIA 35 32    ABG    Component Value Date/Time   PHART 7.449 10/23/2019 2107   PCO2ART 42.6 10/23/2019 2107   PO2ART 50.0 (L) 10/23/2019 2107   HCO3 29.5 (H) 10/23/2019 2107   TCO2 31 10/23/2019 2107   ACIDBASEDEF 1.0 09/25/2019 2143   O2SAT 87.0 10/23/2019 2107     Coagulation Profile: No results for input(s): INR, PROTIME in the last 168 hours.  Cardiac Enzymes: No results for input(s): CKTOTAL, CKMB, CKMBINDEX, TROPONINI in the last 168 hours.  HbA1C: HbA1c, POC (controlled diabetic range)  Date/Time Value Ref Range Status  06/06/2018 03:03 PM 5.7 0.0 - 7.0 % Final   Hgb A1c MFr Bld  Date/Time Value Ref Range Status  10/25/2019 04:19 AM 6.7 (H) 4.8 -  5.6 % Final    Comment:    (NOTE) Pre diabetes:          5.7%-6.4% Diabetes:              >6.4% Glycemic control for   <7.0% adults with diabetes   08/19/2019 05:41 PM 6.3 (H) 4.8 - 5.6 % Final    Comment:    (NOTE) Pre diabetes:          5.7%-6.4% Diabetes:              >6.4% Glycemic control for   <7.0% adults with diabetes     CBG: Recent Labs  Lab 10/25/19 1311 10/25/19 1618 10/25/19 2001 10/26/19 0022 10/26/19 0440  GLUCAP 122* 121* 119* 119* 82    Critical care time: 32 minutes    12/24/19, MD, PhD 10/26/2019, 8:08 AM [facility] [phone] or if no answer [phone]

## 2019-10-26 NOTE — Progress Notes (Signed)
eLink Physician-Brief Progress Note Patient Name: Russell Carter DOB: 10-08-1956 MRN: [id number]   Date of Service  10/26/2019  HPI/Events of Note  Patient remains unarousable despite stopping sedation.  This patient has been on a Versed drip at high rate (~5mg /hr) for 3 days, in addition to a fentanyl drip. RN appropriately weaned sedation overnight but patient remains obtunded.   eICU Interventions  It is not surprising that the patient remains sedated given the large quantity of versed administered over the past 3 days.  He is beginning to bite on the ETT a little per RN, and has pupillary responses and +gag reflex.  I think he needs considerably more time to clear the versed from his bloodstream before a true neuro assessment can be performed.     Intervention Category Intermediate Interventions: Change in mental status - evaluation and management  Marveen Reeks Younique Casad 10/26/2019, 7:04 AM

## 2019-10-26 NOTE — Progress Notes (Signed)
PT Cancellation Note  Patient Details Name: Russell Carter MRN: [id number] DOB: 1956/05/01   Cancelled Treatment:    Reason Eval/Treat Not Completed: Medical issues which prohibited therapy, remains sedated on vent. Check back another time.   Rada Hay 10/26/2019, 5:38 PM Blanchard Kelch PT Acute Rehabilitation Services Pager [phone] Office [phone]

## 2019-10-26 NOTE — Progress Notes (Signed)
ANTICOAGULATION CONSULT NOTE - Initial Consult  Pharmacy Consult for Apixaban > Heparin Indication: pulmonary embolus  Allergies  Allergen Reactions  . Shellfish Allergy Anaphylaxis    Patient Measurements: Height: _0  (180.3 cm) Weight: 226 lb 13.7 oz (102.9 kg) IBW/kg (Calculated) : 75.3 Heparin Dosing Weight: 96 kg   Vital Signs: Temp: 98.5 F (36.9 C) (01/04 1155) Temp Source: Oral (01/04 1155) BP: 122/75 (01/04 1130) Pulse Rate: 106 (01/04 1130)  Labs: Recent Labs    10/24/19 0426 10/25/19 0419 10/26/19 0908  HGB 11.3* 9.6* 10.2*  HCT 38.3* 33.8* 30.0*  PLT 197 162  --   CREATININE 1.71* 1.74*  --     Estimated Creatinine Clearance: 52.4 mL/min (A) (by C-G formula based on SCr of 1.74 mg/dL (H)).   Medical History: Past Medical History:  Diagnosis Date  . Acute kidney injury ([place]) 04/27/2018  . AKI (acute kidney injury) ([place])   . Anginal pain ([place])   . Anxiety   . Arthritis   . Bipolar disorder ([place])   . Blood in stool   . CAD (coronary artery disease)    a. 2011: cath showing mod non-obstructive disease b. 02/2016: cath with 95% stenosis in the Mid RCA. DES placed.  . Cervical stenosis of spine   . Chronic anticoagulation 04/27/2018  . Chronic pain disorder 06/05/2017  . CKD (chronic kidney disease), stage III 05/06/2018  . CKD (chronic kidney disease), stage III 05/06/2018  . Cocaine abuse ([place])    history  . Complete heart block ([place]) 03/14/2016  . Depression   . Diabetes mellitus type 2 in nonobese (HCC)   . Diabetes mellitus without complication ([place])   . Dyslipidemia 03/15/2016  . Essential hypertension 05/18/2009   Qualifier: Diagnosis of  By: Burnett Kanaris    . Gastroesophageal reflux disease without esophagitis 07/31/2017  . Gout, unspecified   . Headache   . History of CVA (cerebrovascular accident)   . History of pulmonary embolus (PE) 02/14/2017  . HTN (hypertension)   . Hypercholesterolemia   . Hyperglycemia   . Leukopenia  05/06/2018  . Migraines   . Myocardial infarction [facility]) 2008   drug cocaine and marijuana at time of mi none  since    . Normocytic anemia 05/06/2018  . Pneumonia   . Polysubstance abuse ([place]) 01/15/2013  . Rectal bleeding   . Renal insufficiency   . Seizure ([place]) 05/06/2018  . Seizures ([place])   . Stroke ([place])   . Vertigo     Medications:  Medications Prior to Admission  Medication Sig Dispense Refill Last Dose  . allopurinol (ZYLOPRIM) 300 MG tablet TAKE 1 TABLET (300 MG TOTAL) BY MOUTH DAILY. 30 tablet 0 10/01/2019 at Unknown time  . amLODipine (NORVASC) 5 MG tablet Take 1 tablet (5 mg total) by mouth daily. 30 tablet 2 10/01/2019 at Unknown time  . apixaban (ELIQUIS) 5 MG TABS tablet Take 1 tablet (5 mg total) by mouth 2 (two) times daily. 60 tablet 4 10/01/2019 at Unknown time  . aspirin EC 81 MG tablet Take 1 tablet (81 mg total) by mouth daily. 30 tablet 2 10/01/2019 at Unknown time  . Calcium Carb-Cholecalciferol (CALCIUM-VITAMIN D) 600-400 MG-UNIT TABS Take 1 tablet by mouth daily. 30 tablet 5 10/01/2019 at Unknown time  . [employer] MAGNESIUM OXIDE 250 MG TABS Take 250 mg by mouth at bedtime.   10/01/2019 at Unknown time  . cyclobenzaprine (FLEXERIL) 10 MG tablet Take 10 mg by mouth 3 (three) times daily.   10/01/2019  at Unknown time  . divalproex (DEPAKOTE) 500 MG DR tablet 1 tablet in the morning, 2 in the evening (Patient taking differently: Take 500-1,000 mg by mouth See admin instructions. Take 1 tablet in the morning and 2 in the evening) 270 tablet 1 10/01/2019 at Unknown time  . DULoxetine (CYMBALTA) 30 MG capsule TAKE 1 CAPSULE (30 MG TOTAL) BY MOUTH 2 (TWO) TIMES DAILY. 60 capsule 0 10/01/2019 at Unknown time  . famotidine (PEPCID) 20 MG tablet Take 20 mg by mouth 2 (two) times daily.   10/01/2019 at Unknown time  . folic acid (FOLVITE) 1 MG tablet Take 1 tablet (1 mg total) by mouth daily. 30 tablet 5 10/01/2019 at Unknown time  . gabapentin (NEURONTIN) 100 MG capsule Take 2  capsules (200 mg total) by mouth 2 (two) times daily. 60 capsule 1 10/01/2019 at Unknown time  . Lacosamide 100 MG TABS Take 1 tablet (100 mg total) by mouth 2 (two) times daily. 60 tablet 3 10/01/2019 at Unknown time  . losartan (COZAAR) 100 MG tablet TAKE 1 TABLET (100 MG TOTAL) BY MOUTH DAILY. 30 tablet 0 10/01/2019 at Unknown time  . metoCLOPramide (REGLAN) 10 MG tablet Take 10 mg by mouth 3 (three) times daily before meals.    10/01/2019 at Unknown time  . Multiple Vitamin (MULTIVITAMIN WITH MINERALS) TABS tablet Take 1 tablet by mouth daily. 30 tablet 5 10/01/2019 at Unknown time  . NARCAN 4 MG/0.1ML LIQD nasal spray kit Place 1 spray into the nose once.      . nitroGLYCERIN (NITROSTAT) 0.4 MG SL tablet Place 1 tablet (0.4 mg total) under the tongue every 5 (five) minutes as needed for chest pain. 25 tablet 2   . oxyCODONE-acetaminophen (PERCOCET) 7.5-325 MG tablet Take 1 tablet by mouth 3 (three) times daily. 20 tablet 0 prn at prn  . rosuvastatin (CRESTOR) 40 MG tablet Take 1 tablet (40 mg total) by mouth daily. 30 tablet 6 10/01/2019 at Unknown time  . tamsulosin (FLOMAX) 0.4 MG CAPS capsule TAKE 1 CAPSULE BY MOUTH AT BEDTIME. (Patient taking differently: Take 0.4 mg by mouth at bedtime. ) 30 capsule 0 10/01/2019 at Unknown time  . testosterone cypionate (DEPOTESTOSTERONE CYPIONATE) 200 MG/ML injection Inject 200 mg into the muscle every 30 (thirty) days.   Past Month at Unknown time  . thiamine (VITAMIN B-1) 100 MG tablet Take 1 tablet (100 mg total) by mouth daily. 100 tablet 2 10/01/2019 at Unknown time  . Vitamin D, Ergocalciferol, (DRISDOL) 1.25 MG (50000 UT) CAPS capsule Take 50,000 Units by mouth every Sunday.    09/27/2019 at Unknown time  . chlordiazePOXIDE (LIBRIUM) 10 MG capsule Take one capsule three times day for one week, then one capsule twice a day for one week then one capsule daily until supply is out. (Patient not taking: Reported on 10/19/2019) 90 capsule 0 Not Taking at  Unknown time    Assessment: 71 YOM admitted to the ICU and intubated for COVID-19 pneumonia on apixaban for h/o PE. Given worsening renal fx, pharmacy consulted to transition from apixaban to IV heparin. Last dose of apixaban was this morning.   H/H low stable, Plt wnl.   Goal of Therapy:  Heparin level 0.3-0.7 units/ml aPTT 66-102 seconds Monitor platelets by anticoagulation protocol: Yes   Plan:  -D/c apixaban  -Heparin at 1500 units/hr at 10 PM tonight. No bolus -F/u 6 hr aPTT -Monitor daily aPTT, HL and s/s of bleeding   Albertina Parr, PharmD., BCPS Clinical Pharmacist Clinical phone  for 10/25/18 until 5pm: [phone]

## 2019-10-26 NOTE — Progress Notes (Addendum)
RN spoke to [employer] that will be coordniating with CCM about patients findings  RN concerned that despite sedation being weaned from 5 Versed to off and Fentanyl 175 to only 25 patient is unresponsive. RN at one point had fentanyl. Completely off but patient started biting at the ETT.   RN notified Elink that patient does withdraw from pain in Right leg but its questionable if it reflex or purposeful movement.   At this moment the only response the Russell Carter gives eye brow movement with oral care, Right leg movement with vigorous pain to the nailbed, He does have corneals and a cough but no gag.   07:02   RN spoke with [facility] provider and discussed patients titrations, metal status, recent double stacking and O2 drop. RN notified provider that the fentynal will increased to 75 mcg due to O2 requiermemts. No new orders given at this time. RN to monitor patient and use as little sedation as possible.   RN will monitor and discuss findings with AM RN

## 2019-10-27 ENCOUNTER — Inpatient Hospital Stay (HOSPITAL_COMMUNITY): Payer: Medicare HMO

## 2019-10-27 DIAGNOSIS — J9601 Acute respiratory failure with hypoxia: Secondary | ICD-10-CM | POA: Diagnosis not present

## 2019-10-27 DIAGNOSIS — J9600 Acute respiratory failure, unspecified whether with hypoxia or hypercapnia: Secondary | ICD-10-CM | POA: Diagnosis not present

## 2019-10-27 DIAGNOSIS — R404 Transient alteration of awareness: Secondary | ICD-10-CM | POA: Diagnosis not present

## 2019-10-27 LAB — POCT I-STAT 7, (LYTES, BLD GAS, ICA,H+H)
Acid-Base Excess: 5 mmol/L — ABNORMAL HIGH (ref 0.0–2.0)
Acid-base deficit: 5 mmol/L — ABNORMAL HIGH (ref 0.0–2.0)
Bicarbonate: 30.6 mmol/L — ABNORMAL HIGH (ref 20.0–28.0)
Bicarbonate: 22.8 mmol/L (ref 20.0–28.0)
Calcium, Ion: 1.08 mmol/L — ABNORMAL LOW (ref 1.15–1.40)
Calcium, Ion: 0.91 mmol/L — ABNORMAL LOW (ref 1.15–1.40)
HCT: 31 % — ABNORMAL LOW (ref 39.0–52.0)
HCT: 25 % — ABNORMAL LOW (ref 39.0–52.0)
Hemoglobin: 10.5 g/dL — ABNORMAL LOW (ref 13.0–17.0)
Hemoglobin: 8.5 g/dL — ABNORMAL LOW (ref 13.0–17.0)
O2 Saturation: 80 %
O2 Saturation: 36 %
Patient temperature: 98.6
Patient temperature: 98.2
Potassium: 5.4 mmol/L — ABNORMAL HIGH (ref 3.5–5.1)
Potassium: 6.2 mmol/L — ABNORMAL HIGH (ref 3.5–5.1)
Sodium: 146 mmol/L — ABNORMAL HIGH (ref 135–145)
Sodium: 137 mmol/L (ref 135–145)
TCO2: 32 mmol/L (ref 22–32)
TCO2: 24 mmol/L (ref 22–32)
pCO2 arterial: 51.7 mmHg — ABNORMAL HIGH (ref 32.0–48.0)
pCO2 arterial: 53 mmHg — ABNORMAL HIGH (ref 32.0–48.0)
pH, Arterial: 7.381 (ref 7.350–7.450)
pH, Arterial: 7.24 — ABNORMAL LOW (ref 7.350–7.450)
pO2, Arterial: 46 mmHg — ABNORMAL LOW (ref 83.0–108.0)
pO2, Arterial: 25 mmHg — CL (ref 83.0–108.0)

## 2019-10-27 LAB — BASIC METABOLIC PANEL
Anion gap: 7 (ref 5–15)
Anion gap: 13 (ref 5–15)
BUN: 27 mg/dL — ABNORMAL HIGH (ref 8–23)
BUN: 37 mg/dL — ABNORMAL HIGH (ref 8–23)
CO2: 28 mmol/L (ref 22–32)
CO2: 21 mmol/L — ABNORMAL LOW (ref 22–32)
Calcium: 7.4 mg/dL — ABNORMAL LOW (ref 8.9–10.3)
Calcium: 6.2 mg/dL — CL (ref 8.9–10.3)
Chloride: 108 mmol/L (ref 98–111)
Chloride: 111 mmol/L (ref 98–111)
Creatinine, Ser: 1.3 mg/dL — ABNORMAL HIGH (ref 0.61–1.24)
Creatinine, Ser: 2.11 mg/dL — ABNORMAL HIGH (ref 0.61–1.24)
GFR calc Af Amer: >60 mL/min (ref >60)
GFR calc Af Amer: 37 mL/min — ABNORMAL LOW (ref >60)
GFR calc non Af Amer: 58 mL/min — ABNORMAL LOW (ref >60)
GFR calc non Af Amer: 32 mL/min — ABNORMAL LOW (ref >60)
Glucose, Bld: 140 mg/dL — ABNORMAL HIGH (ref 70–99)
Glucose, Bld: 210 mg/dL — ABNORMAL HIGH (ref 70–99)
Potassium: 5.3 mmol/L — ABNORMAL HIGH (ref 3.5–5.1)
Potassium: 5.9 mmol/L — ABNORMAL HIGH (ref 3.5–5.1)
Sodium: 143 mmol/L (ref 135–145)
Sodium: 145 mmol/L (ref 135–145)

## 2019-10-27 LAB — GLUCOSE, CAPILLARY
Glucose-Capillary: 138 mg/dL — ABNORMAL HIGH (ref 70–99)
Glucose-Capillary: 122 mg/dL — ABNORMAL HIGH (ref 70–99)
Glucose-Capillary: 145 mg/dL — ABNORMAL HIGH (ref 70–99)
Glucose-Capillary: 119 mg/dL — ABNORMAL HIGH (ref 70–99)
Glucose-Capillary: 148 mg/dL — ABNORMAL HIGH (ref 70–99)
Glucose-Capillary: 92 mg/dL (ref 70–99)

## 2019-10-27 LAB — CBC
HCT: 34.2 % — ABNORMAL LOW (ref 39.0–52.0)
Hemoglobin: 9.9 g/dL — ABNORMAL LOW (ref 13.0–17.0)
MCH: 28.2 pg (ref 26.0–34.0)
MCHC: 28.9 g/dL — ABNORMAL LOW (ref 30.0–36.0)
MCV: 97.4 fL (ref 80.0–100.0)
Platelets: 144 10*3/uL — ABNORMAL LOW (ref 150–400)
RBC: 3.51 MIL/uL — ABNORMAL LOW (ref 4.22–5.81)
RDW: 16.4 % — ABNORMAL HIGH (ref 11.5–15.5)
WBC: 6.4 10*3/uL (ref 4.0–10.5)
nRBC: 0.3 % — ABNORMAL HIGH (ref 0.0–0.2)

## 2019-10-27 LAB — APTT
aPTT: 62 seconds — ABNORMAL HIGH (ref 24–36)
aPTT: 67 seconds — ABNORMAL HIGH (ref 24–36)

## 2019-10-27 LAB — MAGNESIUM: Magnesium: 2.8 mg/dL — ABNORMAL HIGH (ref 1.7–2.4)

## 2019-10-27 LAB — HEPARIN LEVEL (UNFRACTIONATED): Heparin Unfractionated: 1.06 IU/mL — ABNORMAL HIGH (ref 0.30–0.70)

## 2019-10-27 MED ORDER — CHLORHEXIDINE GLUCONATE 0.12 % MT SOLN
15.0000 mL | Freq: Two times a day (BID) | OROMUCOSAL | Status: DC
  Administered 2019-10-27 – 2019-11-01 (×12): 15 mL via OROMUCOSAL
  Filled 2019-10-27 (×3): qty 15

## 2019-10-27 MED ORDER — DEXTROSE 50 % IV SOLN
1.0000 | Freq: Once | INTRAVENOUS | Status: AC
  Administered 2019-10-27: 50 mL via INTRAVENOUS
  Filled 2019-10-27: qty 50

## 2019-10-27 MED ORDER — GLUCERNA 1.5 CAL PO LIQD
1000.0000 mL | ORAL | Status: DC
  Administered 2019-10-27 – 2019-11-01 (×4): 1000 mL
  Filled 2019-10-27 (×9): qty 1000

## 2019-10-27 MED ORDER — INSULIN ASPART 100 UNIT/ML IV SOLN
10.0000 [IU] | Freq: Once | INTRAVENOUS | Status: AC
  Administered 2019-10-27: 22:00:00 10 [IU] via INTRAVENOUS

## 2019-10-27 MED ORDER — NOREPINEPHRINE 4 MG/250ML-% IV SOLN
INTRAVENOUS | Status: AC
  Administered 2019-10-27: 20 ug/min via INTRAVENOUS
  Filled 2019-10-27: qty 250

## 2019-10-27 MED ORDER — PRO-STAT SUGAR FREE PO LIQD
30.0000 mL | Freq: Four times a day (QID) | ORAL | Status: DC
  Administered 2019-10-27 – 2019-11-01 (×20): 30 mL via ORAL
  Filled 2019-10-27 (×16): qty 30

## 2019-10-27 MED ORDER — SODIUM BICARBONATE 8.4 % IV SOLN
100.0000 meq | Freq: Once | INTRAVENOUS | Status: AC
  Administered 2019-10-27: 100 meq via INTRAVENOUS

## 2019-10-27 MED ORDER — SODIUM BICARBONATE 8.4 % IV SOLN
INTRAVENOUS | Status: AC
  Filled 2019-10-27: qty 100

## 2019-10-27 MED ORDER — NOREPINEPHRINE 4 MG/250ML-% IV SOLN
0.0000 ug/min | INTRAVENOUS | Status: DC
  Administered 2019-10-28 (×3): 12 ug/min via INTRAVENOUS
  Administered 2019-10-28: 10 ug/min via INTRAVENOUS
  Administered 2019-10-29: 23:00:00 6 ug/min via INTRAVENOUS
  Administered 2019-10-29 – 2019-10-30 (×3): 2 ug/min via INTRAVENOUS
  Filled 2019-10-27 (×6): qty 250

## 2019-10-27 MED ORDER — CALCIUM GLUCONATE-NACL 1-0.675 GM/50ML-% IV SOLN
INTRAVENOUS | Status: AC
  Administered 2019-10-27: 22:00:00 1000 mg via INTRAVENOUS
  Filled 2019-10-27: qty 50

## 2019-10-27 MED ORDER — CALCIUM GLUCONATE-NACL 1-0.675 GM/50ML-% IV SOLN: 1.0000 g | Freq: Once | INTRAVENOUS | Status: AC

## 2019-10-27 MED ORDER — SODIUM POLYSTYRENE SULFONATE 15 GM/60ML PO SUSP
30.0000 g | Freq: Once | ORAL | Status: AC
  Administered 2019-10-27: 30 g
  Filled 2019-10-27: qty 120

## 2019-10-27 NOTE — Progress Notes (Addendum)
Patient hypotensive at start of shift. Day shift nurse had started neosynephrine. Looking at pt's chat, K+ >5 over the last 2 days, CVP=12. Discussed with elink nurse about giving lasix, treating the potassium or giving IV fluid bolus. Advised to titrate neosynephrine first until BP is better and will assess later.

## 2019-10-27 NOTE — Progress Notes (Signed)
eLink Physician-Brief Progress Note Patient Name: Russell Carter DOB: 12/08/1955 MRN: [id number]   Date of Service  10/27/2019  HPI/Events of Note  Hypotension - BP = 81/49. QRS widened with flat p wave and peaked T wave. Last K+ = 5.3 and I suspend that it may be higher now.   eICU Interventions  Will order: 1. BMP STAT. 2. ABG STAT> 3. NaHCO3 100 meq IV X 1 now.  4. Calcium gluconate 1 gm IV now.  5. D50 ` amp IV now.  6. Novolog insulin 10 units IV now.  7. Norepinephrine IV infusion. Titrate to MAP >= 65.      Intervention Category Major Interventions: Arrhythmia - evaluation and management  [name] 10/27/2019, 10:11 PM

## 2019-10-27 NOTE — Progress Notes (Signed)
Called son and updated him of pt's condition. All questions were answered.

## 2019-10-27 NOTE — Progress Notes (Addendum)
NAME:  Russell Carter, MRN:  [id number], DOB:  06-02-1956, LOS: 25 ADMISSION DATE:  09/29/2019, CONSULTATION DATE: 1/1 REFERRING MD: Dr. Sharon Carter, CHIEF COMPLAINT: Acute respiratory failure  Brief History     History of present illness   64 year old man with coronary disease, hypertension, osteoarthritis, polysubstance abuse, PE requiring chronic anticoagulation, diabetes, seizure disorder and psychiatric disorder (bipolar).  Recent hospitalization 12/4-12/8 for Covid pneumonia, discharged on room air.  Readmitted 12/11 with persistent infiltrates, dyspnea, hypoxemia, altered mental status.  Psych evaluation recommended Cogentin, Haldol, Ativan.  He was treated a second time with remdesivir and Decadron (completed).  Also treated with ceftriaxone for presumed superimposed bacterial pneumonia and UTI.  Has had waxing and waning mental status, encephalopathy during the hospitalization.  Fever over the last 2 days with some progression of work of breathing and hypoxemia.  On 1/1 his work of breathing, hypoxemia, encephalopathy progressively worsened to the point of evolving respiratory failure.  He was moved urgently to the ICU and required intubation.  That course was complicated by desaturation after tube placement and brief PEA arrest (3 minutes).    Past Medical History   has a past medical history of Acute kidney injury (HCC) (04/27/2018), AKI (acute kidney injury) (HCC), Anginal pain (HCC), Anxiety, Arthritis, Bipolar disorder (HCC), Blood in stool, CAD (coronary artery disease), Cervical stenosis of spine, Chronic anticoagulation (04/27/2018), Chronic pain disorder (06/05/2017), CKD (chronic kidney disease), stage III (05/06/2018), CKD (chronic kidney disease), stage III (05/06/2018), Cocaine abuse (HCC), Complete heart block (HCC) (03/14/2016), Depression, Diabetes mellitus type 2 in nonobese (HCC), Diabetes mellitus without complication (HCC), Dyslipidemia (03/15/2016), Essential hypertension (05/18/2009),  Gastroesophageal reflux disease without esophagitis (07/31/2017), Gout, unspecified, Headache, History of CVA (cerebrovascular accident), History of pulmonary embolus (PE) (02/14/2017), HTN (hypertension), Hypercholesterolemia, Hyperglycemia, Leukopenia (05/06/2018), Migraines, Myocardial infarction (HCC) (2008), Normocytic anemia (05/06/2018), Pneumonia, Polysubstance abuse (HCC) (01/15/2013), Rectal bleeding, Renal insufficiency, Seizure (HCC) (05/06/2018), Seizures (HCC), Stroke (HCC), and Vertigo.   Significant Hospital Events   Treated for COVID-19 pneumonia 12/4-12/8 [name], discharged on room air 12/11 readmit via Russell Carter long ED, treated again with remdesivir and Decadron 12/14 evolving encephalopathy 12/16 unwitnessed fall, subsequent CT head unrevealing 12/23 psych consult, Ativan, Cogentin, Haldol recommended 12/31 more sedate, less interactive, some evolving respiratory distress, fevers 1/1 continued encephalopathy, fever, increased work of breathing 1/1 intubation, brief PEA arrest   Consults:  Psych  Procedures:  ET tube 1/1 >>   Significant Diagnostic Tests:  Head CT 12/16 >> no acute abnormality Head CT 12/22 >> no change, no acute abnormality KUB 12/29 >> feeding tube placed appropriately in the stomach  Micro Data:  MRSA screen 1/1 >> negative Urine 12/26 >> negative Urine 12/27 >> negative Urine 1/1 >> negative Blood 1/1 >>  Respiratory 1/2 >> few GPC >> normal flora  Antimicrobials:  Remdesivir course #2 12/12 >> 12/16 Dexamethasone course #2 12/11 >> 12/19 Ceftriaxone 12/25 >> 12/19 Cefepime 1/1 >> 1/2 Zosyn 1/2 >>   Interim history/subjective:  Some vent dyssynchrony and desat this am, sedation improved curently Eliquis transitioned to heparin infusion on 1/4 I/O+ 7.8 L FiO2 0.50, PEEP 8  Objective   Blood pressure 118/71, pulse (!) 112, temperature 98.9 F (37.2 C), resp. rate (!) 28, height 5\' 11"  (1.803 m), weight 102.9 kg, SpO2 93 %. CVP:  [6 mmHg-13  mmHg] 11 mmHg  Vent Mode: PRVC FiO2 (%):  [50 %-60 %] 50 % Set Rate:  [28 bmp] 28 bmp Vt Set:  [440 mL] 440 mL PEEP:  [8 cmH20-10  [place] Pressure:  [22 cmH20-29 cmH20] 22 cmH20   Intake/Output Summary (Last 24 hours) at 10/27/2019 0820 Last data filed at 10/27/2019 0600 Gross per 24 hour  Intake 2531.33 ml  Output 1565 ml  Net 966.33 ml   Filed Weights   10/03/19 1642 10/10/19 0351  Weight: 95.3 kg 102.9 kg    Examination: General: Critically ill man, sedated, intubated HENT: ET tube in good position, oropharynx otherwise clear, poor dentition Lungs: Inspiratory crackles bilaterally, no wheezing Cardiovascular: Regular, distant, no murmur Abdomen: Nondistended, positive bowel sounds Extremities: Left BKA, no edema Neuro: Deeply sedated, RASS -4, no response to [facility] Hospital Problem list   COVID-19 pneumonia  Assessment & Plan:  Acute respiratory failure with hypoxemia.  Due to new bilateral infiltrates and presumed aspiration pneumonia/HCAP superimposed on his residual changes from recent Covid pneumonia Continue ARDS ventilator strategy, sick cc per kilogram Wean PEEP and FiO2 as able Goal plateau pressure less than 30, driving pressure less than 15 May be able to initiate diuresis 1/5 or 1/6 given improved renal function, goal volume removal Continue current sedation, RASS -3 Continue Zosyn for presumed aspiration pneumonia, defer vancomycin as MRSA screen was -1/1  Shock, presumed septic shock, source new pneumonia.  Consider also contribution from sedating medications Wean phenylephrine, goal to off.  Goal MAP greater than 65 Antibiotics as above Careful with sedation, should help his hemodynamics  Acute renal failure improving, associated transient hyperkalemia (improved) Follow BMP, urine output Avoid nephrotoxins, support renal perfusion  Hypernatremia Continue Free water as ordered per tube, 300 cc every 4 hours  Diabetes mellitus  type 2, poorly controlled with hyperglycemia ICU hyperglycemia protocol  Seizure disorder, history of alcohol abuse (uncertain if active) Continue his home Depakote, Vimpat without any evidence of active seizures Would continue to consider EEG if neurological status changes, suspicion for seizure activity changes.  No indication to do so at this time  Toxic metabolic encephalopathy.  Multifactorial.  Likely due to infection, metabolic disarray superimposed on underlying psychiatric disease Need for sedation for mechanical ventilation PAD protocol, fentanyl and Versed infusions  History of pulmonary embolism.  He has been fully anticoagulated with Eliquis during the hospitalization, doubt recurrent VTE Eliquis transition to heparin infusion on 1/4  History of hypertension Maintenance antihypertensive regimen on hold  History of CAD Continue aspirin, statin  Nutrition Tube feeding as ordered    Best practice:  Diet: TF Pain/Anxiety/Delirium protocol (if indicated): Fentanyl, Versed VAP protocol (if indicated): Ordered DVT prophylaxis: Therapeutic heparin GI prophylaxis: Pepcid Glucose control: ICU Hg protocol 1/2 Mobility: BR Code Status: Full Family Communication: Tried to call son Ka[place], unable to reach him 1/5 Disposition: ICU  Labs   CBC: Recent Labs  Lab 10/23/19 0538 10/24/19 0426 10/25/19 0419 10/26/19 0908 10/27/19 0337 10/27/19 0400  WBC 12.0* 12.5* 7.5  --   --  6.4  HGB 12.9* 11.3* 9.6* 10.2* 10.5* 9.9*  HCT 44.3 38.3* 33.8* 30.0* 31.0* 34.2*  MCV 94.9 96.2 97.4  --   --  97.4  PLT 192 197 162  --   --  144*    Basic Metabolic Panel: Recent Labs  Lab 10/22/19 0541 10/23/19 0538 10/24/19 0426 10/25/19 0419 10/26/19 0908 10/27/19 0337 10/27/19 0400  NA 153* 149* 148* 147* 147* 146* 143  K 4.8 5.6* 4.6 4.9 5.0 5.4* 5.3*  CL 114* 112* 113* 113*  --   --  108  CO2 27 25 27 26   --   --  28  GLUCOSE 113* 118* 118* 113*  --   --  140*  BUN 23 22  28* 32*  --   --  27*  CREATININE 1.15 1.44* 1.71* 1.74*  --   --  1.30*  CALCIUM 8.2* 8.1* 6.8* 6.6*  --   --  7.4*  MG  --   --   --   --   --   --  2.8*   GFR: Estimated Creatinine Clearance: 70.1 mL/min (A) (by C-G formula based on SCr of 1.3 mg/dL (H)). Recent Labs  Lab 10/23/19 0538 10/24/19 0426 10/25/19 0419 10/25/19 0424 10/27/19 0400  PROCALCITON 0.29 5.83  --   --   --   WBC 12.0* 12.5* 7.5  --  6.4  LATICACIDVEN  --   --   --  1.7  --     Liver Function Tests: Recent Labs  Lab 10/22/19 0541 10/24/19 0426 10/25/19 0419  AST 21 33 29  ALT 24 20 18   ALKPHOS 47 42 46  BILITOT 0.3 0.6 0.3  PROT 7.7 5.9* 6.0*  ALBUMIN 2.4* 1.8* 1.5*   No results for input(s): LIPASE, AMYLASE in the last 168 hours. Recent Labs  Lab 10/24/19 0427  AMMONIA 32    ABG    Component Value Date/Time   PHART 7.381 10/27/2019 0337   PCO2ART 51.7 (H) 10/27/2019 0337   PO2ART 46.0 (L) 10/27/2019 0337   HCO3 30.6 (H) 10/27/2019 0337   TCO2 32 10/27/2019 0337   ACIDBASEDEF 1.0 09/25/2019 2143   O2SAT 80.0 10/27/2019 0337     Coagulation Profile: No results for input(s): INR, PROTIME in the last 168 hours.  Cardiac Enzymes: No results for input(s): CKTOTAL, CKMB, CKMBINDEX, TROPONINI in the last 168 hours.  HbA1C: HbA1c, POC (controlled diabetic range)  Date/Time Value Ref Range Status  06/06/2018 03:03 PM 5.7 0.0 - 7.0 % Final   Hgb A1c MFr Bld  Date/Time Value Ref Range Status  10/25/2019 04:19 AM 6.7 (H) 4.8 - 5.6 % Final    Comment:    (NOTE) Pre diabetes:          5.7%-6.4% Diabetes:              >6.4% Glycemic control for   <7.0% adults with diabetes   08/19/2019 05:41 PM 6.3 (H) 4.8 - 5.6 % Final    Comment:    (NOTE) Pre diabetes:          5.7%-6.4% Diabetes:              >6.4% Glycemic control for   <7.0% adults with diabetes     CBG: Recent Labs  Lab 10/26/19 0908 10/26/19 1226 10/26/19 1551 10/26/19 2117 10/27/19 0423  GLUCAP 123* 122* 117*  102* 138*    Critical care time: 32 minutes    12/25/19, MD, PhD 10/27/2019, 8:20 AM [facility] [phone] or if no answer [phone]

## 2019-10-27 NOTE — Progress Notes (Signed)
 Nutrition Follow-up  DOCUMENTATION CODES:   Not applicable  INTERVENTION:   Tube Feeding:  Glucerna 1.5 at 55 ml/hr Pro-Stat 30 mL QID Provides 2380 kcals, 169 g of protein and 1003 mL of free water Meets 100% estimated calorie and protein needs  Request new weight  NUTRITION DIAGNOSIS:   Inadequate oral intake related to catabolic illness, lethargy/confusion, decreased appetite as evidenced by NPO status, meal completion < 25%.  Being addressed via TF   GOAL:   Patient will meet greater than or equal to 90% of their needs  Progressing   MONITOR:   PO intake, Labs, Weight trends, TF tolerance, Diet advancement  REASON FOR ASSESSMENT:   Consult Enteral/tube feeding initiation and management  ASSESSMENT:   64 yo admitted with COVID pneumonia, acute encephalopathy. PMH includes CAD, polysubstance abuse, DM, HLD, HTN, CKD, CVA, bipolar disorder  12/11 Admit 12/28 NPO due to lethargy, SLP eval 12/29 Cortrak placed today, gastric 01/01 Intubated, PEA arrest  Patient is currently intubated on ventilator support, off pressors MV: 12.7 L/min Temp (24hrs), Avg:99.5 F (37.5 C), Min:97.9 F (36.6 C), Max:101.6 F (38.7 C)  Glucerna 1.5 at 60 ml/hr, tolerating  Admit weight 95.3 kg; weight of 102.9 kg on 12/19. No weight since  Labs: potassium 5.3 (H), Creatinine 1.30, BUN 27, sodium wdl Meds: ss novolog  Diet Order:   Diet Order            Diet NPO time specified  Diet effective now              EDUCATION NEEDS:   Not appropriate for education at this time  Skin:  Skin Assessment: Reviewed RN Assessment  Last BM:  1/1  Height:   Ht Readings from Last 1 Encounters:  10/03/19 5\' 11"  (1.803 m)    Weight:   Wt Readings from Last 1 Encounters:  10/10/19 102.9 kg    Ideal Body Weight:  78.2 kg  BMI:  Body mass index is 31.64 kg/m.  Estimated Nutritional Needs:   Kcal:  2340 kcals  Protein:  155-195 g  Fluid:  >/= 2 L    Johannes Everage  MS, RDN, LDN, CNSC [phone] Pager  [phone] Weekend/On-Call Pager

## 2019-10-27 NOTE — Progress Notes (Signed)
PT Cancellation Note  Patient Details Name: Russell Carter MRN: [id number] DOB: 1956-04-09   Cancelled Treatment:    Reason Eval/Treat Not Completed: Medical issues which prohibited therapy, vented and sedated. Will check back another day.   Rada Hay 10/27/2019, 12:03 PM Blanchard Kelch PT Acute Rehabilitation Services Pager [phone] Office [phone]

## 2019-10-27 NOTE — Progress Notes (Signed)
ANTICOAGULATION CONSULT NOTE - Follow Up Consult  Pharmacy Consult for Apixaban > Heparin Indication: pulmonary embolus  Allergies  Allergen Reactions  . Shellfish Allergy Anaphylaxis    Patient Measurements: Height: _0  (180.3 cm) Weight: 226 lb 13.7 oz (102.9 kg) IBW/kg (Calculated) : 75.3 Heparin Dosing Weight: 96 kg   Vital Signs: Temp: 99.1 F (37.3 C) (01/05 0800) Temp Source: Axillary (01/05 0800) BP: 114/64 (01/05 1000) Pulse Rate: 124 (01/05 1000)  Labs: Recent Labs    10/25/19 0419 10/26/19 0908 10/26/19 2240 10/27/19 0337 10/27/19 0400 10/27/19 1145  HGB 9.6* 10.2*  --  10.5* 9.9*  --   HCT 33.8* 30.0*  --  31.0* 34.2*  --   PLT 162  --   --   --  144*  --   APTT  --   --  43*  --   --  62*  HEPARINUNFRC  --   --  1.06*  --   --   --   CREATININE 1.74*  --   --   --  1.30*  --     Estimated Creatinine Clearance: 70.1 mL/min (A) (by C-G formula based on SCr of 1.3 mg/dL (H)).   Medical History: Past Medical History:  Diagnosis Date  . Acute kidney injury ([place]) 04/27/2018  . AKI (acute kidney injury) ([place])   . Anginal pain ([place])   . Anxiety   . Arthritis   . Bipolar disorder ([place])   . Blood in stool   . CAD (coronary artery disease)    a. 2011: cath showing mod non-obstructive disease b. 02/2016: cath with 95% stenosis in the Mid RCA. DES placed.  . Cervical stenosis of spine   . Chronic anticoagulation 04/27/2018  . Chronic pain disorder 06/05/2017  . CKD (chronic kidney disease), stage III 05/06/2018  . CKD (chronic kidney disease), stage III 05/06/2018  . Cocaine abuse ([place])    history  . Complete heart block ([place]) 03/14/2016  . Depression   . Diabetes mellitus type 2 in nonobese (H[facility])   . Diabetes mellitus without complication ([place])   . Dyslipidemia 03/15/2016  . Essential hypertension 05/18/2009   Qualifier: Diagnosis of  By: Burnett Kanaris    . Gastroesophageal reflux disease without esophagitis 07/31/2017  . Gout, unspecified   .  Headache   . History of CVA (cerebrovascular accident)   . History of pulmonary embolus (PE) 02/14/2017  . HTN (hypertension)   . Hypercholesterolemia   . Hyperglycemia   . Leukopenia 05/06/2018  . Migraines   . Myocardial infarction [facility]) 2008   drug cocaine and marijuana at time of mi none  since    . Normocytic anemia 05/06/2018  . Pneumonia   . Polysubstance abuse ([place]) 01/15/2013  . Rectal bleeding   . Renal insufficiency   . Seizure ([place]) 05/06/2018  . Seizures ([place])   . Stroke ([place])   . Vertigo     Medications:  Medications Prior to Admission  Medication Sig Dispense Refill Last Dose  . allopurinol (ZYLOPRIM) 300 MG tablet TAKE 1 TABLET (300 MG TOTAL) BY MOUTH DAILY. 30 tablet 0 10/01/2019 at Unknown time  . amLODipine (NORVASC) 5 MG tablet Take 1 tablet (5 mg total) by mouth daily. 30 tablet 2 10/01/2019 at Unknown time  . apixaban (ELIQUIS) 5 MG TABS tablet Take 1 tablet (5 mg total) by mouth 2 (two) times daily. 60 tablet 4 10/01/2019 at Unknown time  . aspirin EC 81 MG tablet Take 1 tablet (81 mg  total) by mouth daily. 30 tablet 2 10/01/2019 at Unknown time  . Calcium Carb-Cholecalciferol (CALCIUM-VITAMIN D) 600-400 MG-UNIT TABS Take 1 tablet by mouth daily. 30 tablet 5 10/01/2019 at Unknown time  . [employer] MAGNESIUM OXIDE 250 MG TABS Take 250 mg by mouth at bedtime.   10/01/2019 at Unknown time  . cyclobenzaprine (FLEXERIL) 10 MG tablet Take 10 mg by mouth 3 (three) times daily.   10/01/2019 at Unknown time  . divalproex (DEPAKOTE) 500 MG DR tablet 1 tablet in the morning, 2 in the evening (Patient taking differently: Take 500-1,000 mg by mouth See admin instructions. Take 1 tablet in the morning and 2 in the evening) 270 tablet 1 10/01/2019 at Unknown time  . DULoxetine (CYMBALTA) 30 MG capsule TAKE 1 CAPSULE (30 MG TOTAL) BY MOUTH 2 (TWO) TIMES DAILY. 60 capsule 0 10/01/2019 at Unknown time  . famotidine (PEPCID) 20 MG tablet Take 20 mg by mouth 2 (two) times daily.   10/01/2019  at Unknown time  . folic acid (FOLVITE) 1 MG tablet Take 1 tablet (1 mg total) by mouth daily. 30 tablet 5 10/01/2019 at Unknown time  . gabapentin (NEURONTIN) 100 MG capsule Take 2 capsules (200 mg total) by mouth 2 (two) times daily. 60 capsule 1 10/01/2019 at Unknown time  . Lacosamide 100 MG TABS Take 1 tablet (100 mg total) by mouth 2 (two) times daily. 60 tablet 3 10/01/2019 at Unknown time  . losartan (COZAAR) 100 MG tablet TAKE 1 TABLET (100 MG TOTAL) BY MOUTH DAILY. 30 tablet 0 10/01/2019 at Unknown time  . metoCLOPramide (REGLAN) 10 MG tablet Take 10 mg by mouth 3 (three) times daily before meals.    10/01/2019 at Unknown time  . Multiple Vitamin (MULTIVITAMIN WITH MINERALS) TABS tablet Take 1 tablet by mouth daily. 30 tablet 5 10/01/2019 at Unknown time  . NARCAN 4 MG/0.1ML LIQD nasal spray kit Place 1 spray into the nose once.      . nitroGLYCERIN (NITROSTAT) 0.4 MG SL tablet Place 1 tablet (0.4 mg total) under the tongue every 5 (five) minutes as needed for chest pain. 25 tablet 2   . oxyCODONE-acetaminophen (PERCOCET) 7.5-325 MG tablet Take 1 tablet by mouth 3 (three) times daily. 20 tablet 0 prn at prn  . rosuvastatin (CRESTOR) 40 MG tablet Take 1 tablet (40 mg total) by mouth daily. 30 tablet 6 10/01/2019 at Unknown time  . tamsulosin (FLOMAX) 0.4 MG CAPS capsule TAKE 1 CAPSULE BY MOUTH AT BEDTIME. (Patient taking differently: Take 0.4 mg by mouth at bedtime. ) 30 capsule 0 10/01/2019 at Unknown time  . testosterone cypionate (DEPOTESTOSTERONE CYPIONATE) 200 MG/ML injection Inject 200 mg into the muscle every 30 (thirty) days.   Past Month at Unknown time  . thiamine (VITAMIN B-1) 100 MG tablet Take 1 tablet (100 mg total) by mouth daily. 100 tablet 2 10/01/2019 at Unknown time  . Vitamin D, Ergocalciferol, (DRISDOL) 1.25 MG (50000 UT) CAPS capsule Take 50,000 Units by mouth every Sunday.    09/27/2019 at Unknown time  . chlordiazePOXIDE (LIBRIUM) 10 MG capsule Take one capsule three  times day for one week, then one capsule twice a day for one week then one capsule daily until supply is out. (Patient not taking: Reported on 09/29/2019) 90 capsule 0 Not Taking at Unknown time    Assessment: 50 YOM admitted to the ICU and intubated for COVID-19 pneumonia on apixaban for h/o PE. Given worsening renal fx, pharmacy was consulted to transition from apixaban to  IV heparin.  Currently on IV heparin at 1500 units/hr. APTT this AM is subtherapeutic. H/H low stable. No s/s of bleeding.   Goal of Therapy:  Heparin level 0.3-0.7 units/ml aPTT 66-102 seconds Monitor platelets by anticoagulation protocol: Yes   Plan:  -Increase heparin to 1650 units/hr -F/u 6 hr aPTT -Monitor daily aPTT, HL and s/s of bleeding   Albertina Parr, PharmD., BCPS Clinical Pharmacist Clinical phone for 10/26/18 until 5pm: [phone]

## 2019-10-27 NOTE — Progress Notes (Signed)
ANTICOAGULATION CONSULT NOTE - Follow Up Consult  Pharmacy Consult for Apixaban > Heparin Indication: pulmonary embolus  Patient Measurements: Height: 5\' 11"  (180.3 cm) Weight: 226 lb 13.7 oz (102.9 kg) IBW/kg (Calculated) : 75.3 Heparin Dosing Weight: 96 kg   Vital Signs: Temp: 98.2 F (36.8 C) (01/05 2100) Temp Source: Oral (01/05 2100) BP: 90/52 (01/05 2145) Pulse Rate: 112 (01/05 2034)  Labs: Recent Labs    10/25/19 0419 10/26/19 2240 10/27/19 0400 10/27/19 1145 10/27/19 2100 10/27/19 2222 10/27/19 2233  HGB 9.6*  --  9.9*  --   --  8.5* 10.5*  HCT 33.8*  --  34.2*  --   --  25.0* 31.0*  PLT 162  --  144*  --   --   --   --   APTT  --  43*  --  62* 67*  --   --   HEPARINUNFRC  --  1.06*  --   --   --   --   --   CREATININE 1.74*  --  1.30*  --   --   --   --     Estimated Creatinine Clearance: 70.1 mL/min (A) (by C-G formula based on SCr of 1.3 mg/dL (H)).   Assessment: 12 YOM admitted to the ICU and intubated for COVID-19 pneumonia on apixaban for h/o PE. Given worsening renal fx, pharmacy was consulted to transition from apixaban to IV heparin.  10/27/19 2300 UPDATE: aPTT: 67 seconds, just within therapeutic goal range Hb has increased to 10.5mg /dL RN reports no issues with bleeding or infusion site  Goal of Therapy:  Heparin level 0.3-0.7 units/ml aPTT 66-102 seconds Monitor platelets by anticoagulation protocol: Yes   Plan:  -Continue heparin infusion rate at  1650 units/hr -Monitor daily aPTT, HL and s/s of bleeding   12/25/19, Pharm. D. Clinical Pharmacist 10/27/2019 10:59 PM

## 2019-10-27 NOTE — Progress Notes (Signed)
eLink Physician-Brief Progress Note Patient Name: Blue Winther DOB: 16-Jan-1956 MRN: [id number]   Date of Service  10/27/2019  HPI/Events of Note  Hyperkalemia - K+ = 7.0 by report from bedside nurse.   eICU Interventions  Will order: 1. Kayexalate 30 gm per tube now.  2. Repeat BMP at 5 AM.      Intervention Category Major Interventions: Electrolyte abnormality - evaluation and management  Edward Trevino Eugene 10/27/2019, 10:42 PM

## 2019-10-27 NOTE — Progress Notes (Signed)
OT Cancellation Note  Patient Details Name: Russell Carter MRN: [id number] DOB: 08/13/1956   Cancelled Treatment:    Reason Eval/Treat Not Completed: Patient not medically ready(Intubated and sedated. Will return as schedule allows.)  Niesha Bame M Pawan Knechtel Yoniel Arkwright MSOT, OTR/L Acute Rehab Pager: [phone] Office: [phone] 10/27/2019, 7:39 AM

## 2019-10-27 NOTE — Progress Notes (Signed)
RN confirmed with pharmacy to start Heparin gtt. New IV started Gtt infusing in [facility]

## 2019-10-28 LAB — BASIC METABOLIC PANEL
Anion gap: 8 (ref 5–15)
Anion gap: 9 (ref 5–15)
BUN: 41 mg/dL — ABNORMAL HIGH (ref 8–23)
BUN: 43 mg/dL — ABNORMAL HIGH (ref 8–23)
CO2: 25 mmol/L (ref 22–32)
CO2: 25 mmol/L (ref 22–32)
Calcium: 6.6 mg/dL — ABNORMAL LOW (ref 8.9–10.3)
Calcium: 6.4 mg/dL — CL (ref 8.9–10.3)
Chloride: 111 mmol/L (ref 98–111)
Chloride: 113 mmol/L — ABNORMAL HIGH (ref 98–111)
Creatinine, Ser: 1.84 mg/dL — ABNORMAL HIGH (ref 0.61–1.24)
Creatinine, Ser: 1.71 mg/dL — ABNORMAL HIGH (ref 0.61–1.24)
GFR calc Af Amer: 44 mL/min — ABNORMAL LOW (ref >60)
GFR calc Af Amer: 48 mL/min — ABNORMAL LOW (ref >60)
GFR calc non Af Amer: 38 mL/min — ABNORMAL LOW (ref >60)
GFR calc non Af Amer: 41 mL/min — ABNORMAL LOW (ref >60)
Glucose, Bld: 130 mg/dL — ABNORMAL HIGH (ref 70–99)
Glucose, Bld: 162 mg/dL — ABNORMAL HIGH (ref 70–99)
Potassium: 5.6 mmol/L — ABNORMAL HIGH (ref 3.5–5.1)
Potassium: 4.5 mmol/L (ref 3.5–5.1)
Sodium: 144 mmol/L (ref 135–145)
Sodium: 147 mmol/L — ABNORMAL HIGH (ref 135–145)

## 2019-10-28 LAB — CBC
HCT: 31 % — ABNORMAL LOW (ref 39.0–52.0)
Hemoglobin: 8.8 g/dL — ABNORMAL LOW (ref 13.0–17.0)
MCH: 28 pg (ref 26.0–34.0)
MCHC: 28.4 g/dL — ABNORMAL LOW (ref 30.0–36.0)
MCV: 98.7 fL (ref 80.0–100.0)
Platelets: 193 10*3/uL (ref 150–400)
RBC: 3.14 MIL/uL — ABNORMAL LOW (ref 4.22–5.81)
RDW: 16.5 % — ABNORMAL HIGH (ref 11.5–15.5)
WBC: 8.2 10*3/uL (ref 4.0–10.5)
nRBC: 10.9 % — ABNORMAL HIGH (ref 0.0–0.2)

## 2019-10-28 LAB — HEPARIN LEVEL (UNFRACTIONATED): Heparin Unfractionated: 0.65 IU/mL (ref 0.30–0.70)

## 2019-10-28 LAB — GLUCOSE, CAPILLARY
Glucose-Capillary: 110 mg/dL — ABNORMAL HIGH (ref 70–99)
Glucose-Capillary: 119 mg/dL — ABNORMAL HIGH (ref 70–99)
Glucose-Capillary: 123 mg/dL — ABNORMAL HIGH (ref 70–99)
Glucose-Capillary: 129 mg/dL — ABNORMAL HIGH (ref 70–99)
Glucose-Capillary: 141 mg/dL — ABNORMAL HIGH (ref 70–99)
Glucose-Capillary: 142 mg/dL — ABNORMAL HIGH (ref 70–99)
Glucose-Capillary: 147 mg/dL — ABNORMAL HIGH (ref 70–99)

## 2019-10-28 LAB — CULTURE, BLOOD (ROUTINE X 2)
Culture: NO GROWTH
Culture: NO GROWTH
Special Requests: ADEQUATE
Special Requests: ADEQUATE

## 2019-10-28 LAB — POCT I-STAT 7, (LYTES, BLD GAS, ICA,H+H)
Acid-Base Excess: 3 mmol/L — ABNORMAL HIGH (ref 0.0–2.0)
Bicarbonate: 29.8 mmol/L — ABNORMAL HIGH (ref 20.0–28.0)
Calcium, Ion: 1.04 mmol/L — ABNORMAL LOW (ref 1.15–1.40)
HCT: 34 % — ABNORMAL LOW (ref 39.0–52.0)
Hemoglobin: 11.6 g/dL — ABNORMAL LOW (ref 13.0–17.0)
O2 Saturation: 97 %
Patient temperature: 98.6
Potassium: 6 mmol/L — ABNORMAL HIGH (ref 3.5–5.1)
Sodium: 144 mmol/L (ref 135–145)
TCO2: 32 mmol/L (ref 22–32)
pCO2 arterial: 57.6 mmHg — ABNORMAL HIGH (ref 32.0–48.0)
pH, Arterial: 7.322 — ABNORMAL LOW (ref 7.350–7.450)
pO2, Arterial: 97 mmHg (ref 83.0–108.0)

## 2019-10-28 LAB — LACTIC ACID, PLASMA: Lactic Acid, Venous: 2.4 mmol/L (ref 0.5–1.9)

## 2019-10-28 LAB — APTT
aPTT: 52 seconds — ABNORMAL HIGH (ref 24–36)
aPTT: 45 seconds — ABNORMAL HIGH (ref 24–36)

## 2019-10-28 LAB — OCCULT BLOOD X 1 CARD TO LAB, STOOL

## 2019-10-28 LAB — MAGNESIUM: Magnesium: 2.8 mg/dL — ABNORMAL HIGH (ref 1.7–2.4)

## 2019-10-28 MED ORDER — SODIUM POLYSTYRENE SULFONATE 15 GM/60ML PO SUSP
30.0000 g | Freq: Once | ORAL | Status: AC
  Administered 2019-10-28: 07:00:00 30 g via ORAL
  Filled 2019-10-28: qty 120

## 2019-10-28 NOTE — Progress Notes (Signed)
Changes made per persistent peak pressures in high 50's. RT lavaged ptX2 with no improvement. RT attempted recruitment maneuver and pt desat into 80's. Placed back onto previous PRVC settings. Pt peak pressure remained 55 and higher. RT then placed pt into pressure control and peak pressures dropped into low 40's. RT increased rate to maintain previous minute ventilation and tidal volume is high 300's low 400's. RT to draw ABG in 1 hour post changes.

## 2019-10-28 NOTE — Progress Notes (Signed)
Result of K+ on the ABG relayed to CCM. Advised to wait for the result of AM labs.

## 2019-10-28 NOTE — Progress Notes (Signed)
ANTICOAGULATION CONSULT NOTE - Follow Up Consult  Pharmacy Consult for Apixaban > Heparin Indication: pulmonary embolus  Patient Measurements: Height: 5\' 11"  (180.3 cm) Weight: 226 lb 13.7 oz (102.9 kg) IBW/kg (Calculated) : 75.3 Heparin Dosing Weight: 96 kg   Vital Signs: Temp: 101.3 F (38.5 C) (01/06 1600) Temp Source: Axillary (01/06 1600) BP: 103/69 (01/06 1700) Pulse Rate: 102 (01/06 1700)  Labs: Recent Labs    10/26/19 2240 10/27/19 0337 10/27/19 0400 10/27/19 1145 10/27/19 2100 10/27/19 2230 10/27/19 2233 10/28/19 0436 10/28/19 0500 10/28/19 1617  HGB  --   --  9.9*   < >  --   --  10.5* 11.6* 8.8*  --   HCT  --   --  34.2*   < >  --   --  31.0* 34.0* 31.0*  --   PLT  --   --  144*  --   --   --   --   --  193  --   APTT 43*   < >  --   --  67*  --   --   --  52* 45*  HEPARINUNFRC 1.06*  --   --   --   --   --   --   --  0.65  --   CREATININE  --    < > 1.30*  --   --  2.11*  --   --  1.84* 1.71*   < > = values in this interval not displayed.    Estimated Creatinine Clearance: 53.3 mL/min (A) (by C-G formula based on SCr of 1.71 mg/dL (H)).   Assessment: 55 YOM admitted to the ICU and intubated for COVID-19 pneumonia on apixaban for h/o PE. Given worsening renal fx, pharmacy was consulted to transition from apixaban to IV heparin.  PTT came back subtherapeutic tonight. Heparin level has still not correlating yet.   Goal of Therapy:  Heparin level 0.3-0.7 units/ml aPTT 66-102 seconds Monitor platelets by anticoagulation protocol: Yes   Plan:  -Increase heparin infusion rate to 1900 units/hr -HL and PTT level in AM -Monitor daily aPTT, HL and s/s of bleeding   76, PharmD, BCIDP, AAHIVP, CPP Infectious Disease Pharmacist 10/28/2019 6:24 PM

## 2019-10-28 NOTE — Progress Notes (Addendum)
ANTICOAGULATION & ANTIBIOTIC CONSULT NOTE - Follow Up Consult  Pharmacy Consult for Heparin: Zosyn Indication: pulmonary embolus; Aspiration pneumonia   Patient Measurements: Height: 5\' 11"  (180.3 cm) Weight: 226 lb 13.7 oz (102.9 kg) IBW/kg (Calculated) : 75.3 Heparin Dosing Weight: 96 kg   Vital Signs: Temp: 99 F (37.2 C) (01/06 0500) Temp Source: Oral (01/06 0500) BP: 95/62 (01/06 0645) Pulse Rate: 104 (01/06 0645)  Labs: Recent Labs    10/26/19 2240 10/26/19 2240 10/27/19 0400 10/27/19 1145 10/27/19 2100 10/27/19 2230 10/27/19 2233 10/28/19 0436 10/28/19 0500  HGB  --    < > 9.9*  --   --   --  10.5* 11.6* 8.8*  HCT  --    < > 34.2*  --   --   --  31.0* 34.0* 31.0*  PLT  --   --  144*  --   --   --   --   --  193  APTT 43*  --   --  62* 67*  --   --   --  52*  HEPARINUNFRC 1.06*  --   --   --   --   --   --   --  0.65  CREATININE  --   --  1.30*  --   --  2.11*  --   --  1.84*   < > = values in this interval not displayed.    Estimated Creatinine Clearance: 49.5 mL/min (A) (by C-G formula based on SCr of 1.84 mg/dL (H)).   Assessment: 55 YOM admitted to the ICU and intubated for COVID-19 pneumonia on apixaban for h/o PE. Given worsening renal fx, pharmacy was consulted to transition from apixaban to IV heparin.  Pt is currently on IV heparin at 1650 units/hr. APTT and HL do not seem to correlate yet which indicates that apixaban likely has not been fully eliminated. Will continue to follow aPTT which is subtherapeutic this AM. Hgb has trended down while platelets continue to improve. No s/s of bleeding noted per RN   ID: Patient is currently on D5 of Zosyn for aspiration pneumonia. SCr improving. Planning a 7 day course   Goal of Therapy:  Heparin level 0.3-0.7 units/ml aPTT 66-102 seconds Monitor platelets by anticoagulation protocol: Yes   Plan:  -Increase heparin infusion rate to  1750 units/hr -F/u 6hr aPTT -Monitor daily aPTT, HL and s/s of bleeding    -Continue Zosyn 3.375 gm IV Q 8 hours. 7-day LOT  -Monitor renal fx  77, PharmD., BCPS Clinical Pharmacist Clinical phone for 10/27/18 until 5pm: [phone]

## 2019-10-28 NOTE — Progress Notes (Signed)
NAME:  Dyllin Gulley, MRN:  [id number], DOB:  [birth year], LOS: 26 ADMISSION DATE:  10/22/2019, CONSULTATION DATE: 1/1 REFERRING MD: Dr. Sharon Seller, CHIEF COMPLAINT: Acute respiratory failure  Brief History     History of present illness   64 year old man with coronary disease, hypertension, osteoarthritis, polysubstance abuse, PE requiring chronic anticoagulation, diabetes, seizure disorder and psychiatric disorder (bipolar).  Recent hospitalization 12/4-12/8 for Covid pneumonia, discharged on room air.  Readmitted 12/11 with persistent infiltrates, dyspnea, hypoxemia, altered mental status.  Psych evaluation recommended Cogentin, Haldol, Ativan.  He was treated a second time with remdesivir and Decadron (completed).  Also treated with ceftriaxone for presumed superimposed bacterial pneumonia and UTI.  Has had waxing and waning mental status, encephalopathy during the hospitalization.  Fever over the last 2 days with some progression of work of breathing and hypoxemia.  On 1/1 his work of breathing, hypoxemia, encephalopathy progressively worsened to the point of evolving respiratory failure.  He was moved urgently to the ICU and required intubation.  That course was complicated by desaturation after tube placement and brief PEA arrest (3 minutes).    Past Medical History   has a past medical history of Acute kidney injury (HCC) (04/27/2018), AKI (acute kidney injury) (HCC), Anginal pain (HCC), Anxiety, Arthritis, Bipolar disorder (HCC), Blood in stool, CAD (coronary artery disease), Cervical stenosis of spine, Chronic anticoagulation (04/27/2018), Chronic pain disorder (06/05/2017), CKD (chronic kidney disease), stage III (05/06/2018), CKD (chronic kidney disease), stage III (05/06/2018), Cocaine abuse (HCC), Complete heart block (HCC) (03/14/2016), Depression, Diabetes mellitus type 2 in nonobese (HCC), Diabetes mellitus without complication (HCC), Dyslipidemia (03/15/2016), Essential hypertension (05/18/2009),  Gastroesophageal reflux disease without esophagitis (07/31/2017), Gout, unspecified, Headache, History of CVA (cerebrovascular accident), History of pulmonary embolus (PE) (02/14/2017), HTN (hypertension), Hypercholesterolemia, Hyperglycemia, Leukopenia (05/06/2018), Migraines, Myocardial infarction (HCC) (2008), Normocytic anemia (05/06/2018), Pneumonia, Polysubstance abuse (HCC) (01/15/2013), Rectal bleeding, Renal insufficiency, Seizure (HCC) (05/06/2018), Seizures (HCC), Stroke (HCC), and Vertigo.   Significant Hospital Events   Treated for COVID-19 pneumonia 12/4-12/8 [name], discharged on room air 12/11 readmit via [name], treated again with remdesivir and Decadron 12/14 evolving encephalopathy 12/16 unwitnessed fall, subsequent CT head unrevealing 12/23 psych consult, Ativan, Cogentin, Haldol recommended 12/31 more sedate, less interactive, some evolving respiratory distress, fevers 1/1 continued encephalopathy, fever, increased work of breathing 1/1 intubation, brief PEA arrest   Consults:  Psych  Procedures:  ET tube 1/1 >>   Significant Diagnostic Tests:  Head CT 12/16 >> no acute abnormality Head CT 12/22 >> no change, no acute abnormality KUB 12/29 >> feeding tube placed appropriately in the stomach  Micro Data:  MRSA screen 1/1 >> negative Urine 12/26 >> negative Urine 12/27 >> negative Urine 1/1 >> negative Blood 1/1 >>  Respiratory 1/2 >> few GPC >> normal flora Blood 1/6 >> Urine 1/6 >> Respiratory 1/6 >>  Antimicrobials:  Remdesivir course #2 12/12 >> 12/16 Dexamethasone course #2 12/11 >> 12/19 Ceftriaxone 12/25 >> 12/19 Cefepime 1/1 >> 1/2 Zosyn 1/2 >>   Interim history/subjective:  Phenylephrine restarted afternoon 1/5, norepinephrine added Peaked T waves and QRS widening noted 1/5 evening, BMP revealed evolving acute renal failure, treated empirically for hyperkalemia Hyperkalemia confirmed ABG 22:33, down to 5.6 this morning Continues to have  adequate urine output, now I/O+ 12 L   Objective   Blood pressure 104/66, pulse (!) 105, temperature 99 F (37.2 C), temperature source Oral, resp. rate (!) 28, height 5\' 11"  (1.803 m), weight 102.9 kg, SpO2 99 %. CVP:  [7 mmHg-18  mmHg] 12 mmHg  Vent Mode: PCV FiO2 (%):  [50 %-100 %] 100 % Set Rate:  [28 bmp-35 bmp] 35 bmp Vt Set:  [440 mL] 440 mL PEEP:  [8 cmH20-14 cmH20] 14 cmH20 Plateau Pressure:  [26 cmH20-32 cmH20] 26 cmH20   Intake/Output Summary (Last 24 hours) at 10/28/2019 0757 Last data filed at 10/28/2019 0700 Gross per 24 hour  Intake 5307.36 ml  Output 1145 ml  Net 4162.36 ml   Filed Weights   10/03/19 1642 10/10/19 0351  Weight: 95.3 kg 102.9 kg    Examination: General: Critically ill man, sedated, intubated HENT: ET tube in good position, oropharynx otherwise clear, poor dentition Lungs: Inspiratory rhonchi, good air movement Cardiovascular: Regular, distant, no murmur Abdomen: Nondistended, positive bowel sounds, having BMs Extremities: no edema Neuro: Deeply sedated, RASS -4, no response to [facility] Problem list   COVID-19 pneumonia  Assessment & Plan:  Acute respiratory failure with hypoxemia.  Due to new bilateral infiltrates and presumed aspiration pneumonia/HCAP superimposed on his residual changes from recent Covid pneumonia Low tidal volume ventilator strategy, 6 cc/kg Wean PEEP and FiO2 as able Maintain same goal plateau and driving pressures Goal volume removal but with evolving hypotension, renal insufficiency not possible at this time Continue current deep sedation, RASS -3 Complete 7-day course Zosyn for presumed aspiration pneumonia.  Vancomycin deferred as MRSA screen was negative on 1/1.  Respiratory culture normal flora.    Shock, presumed septic shock, source new pneumonia.  Consider also contribution from sedating medications Phenylephrine has been uptitrated and now norepinephrine added, goal wean phenylephrine first, MAP  65 Antibiotics as above.  Repeat blood, urine, respiratory cultures on 1/6 Defer diuresis for now Careful with sedation, may be able to decrease.  Should help his hemodynamics  Acute renal failure, recurrent (worse 1/5-1/6) and appears to be associated in time with shock state Hyperkalemia Follow BMP, urine output Avoid nephrotoxins, support renal perfusion May need to broach subject of HD w family if hyperkalemia continues to be an issue, renal fxn continues to decline   Hypernatremia Continue Free water as ordered per tube, 300 cc every 4 hours  Diabetes mellitus type 2, poorly controlled with hyperglycemia ICU hyperglycemia protocol  Seizure disorder, history of alcohol abuse (uncertain if active) Continue his home Depakote, Vimpat without any evidence of active seizures Consider repeat EEG if any evidence for seizure activity, fails to wake with lightening of sedation  Toxic metabolic encephalopathy.  Multifactorial.  Likely due to infection, metabolic disarray superimposed on underlying psychiatric disease Need for sedation for mechanical ventilation Continue PAD protocol, fentanyl and Versed infusions  History of pulmonary embolism.  He has been fully anticoagulated with Eliquis during the hospitalization, doubt recurrent VTE On Eliquis at home, continue heparin infusion  History of hypertension Maintenance antihypertensive regimen on hold   History of CAD Aspirin, statin  Nutrition Tube feeding as ordered    Best practice:  Diet: TF Pain/Anxiety/Delirium protocol (if indicated): Fentanyl, Versed VAP protocol (if indicated): Ordered DVT prophylaxis: Therapeutic heparin GI prophylaxis: Pepcid Glucose control: ICU Hg protocol 1/2 Mobility: BR Code Status: Full Family Communication: spoke with pt's son Fatih 1/6 Disposition: ICU  Labs   CBC: Recent Labs  Lab 10/23/19 0538 10/24/19 0426 10/25/19 0419 10/27/19 0400 10/27/19 2222 10/27/19 2233  10/28/19 0436 10/28/19 0500  WBC 12.0* 12.5* 7.5 6.4  --   --   --  8.2  HGB 12.9* 11.3* 9.6* 9.9* 8.5* 10.5* 11.6* 8.8*  HCT 44.3 38.3* 33.8*  34.2* 25.0* 31.0* 34.0* 31.0*  MCV 94.9 96.2 97.4 97.4  --   --   --  98.7  PLT 192 197 162 144*  --   --   --  193    Basic Metabolic Panel: Recent Labs  Lab 10/24/19 0426 10/25/19 0419 10/27/19 0400 10/27/19 2222 10/27/19 2230 10/27/19 2233 10/28/19 0436 10/28/19 0500  NA 148* 147* 143 137 145 142 144 144  K 4.6 4.9 5.3* 6.2* 5.9* 7.0* 6.0* 5.6*  CL 113* 113* 108  --  111  --   --  111  CO2 27 26 28   --  21*  --   --  25  GLUCOSE 118* 113* 140*  --  210*  --   --  130*  BUN 28* 32* 27*  --  37*  --   --  41*  CREATININE 1.71* 1.74* 1.30*  --  2.11*  --   --  1.84*  CALCIUM 6.8* 6.6* 7.4*  --  6.2*  --   --  6.6*  MG  --   --  2.8*  --   --   --   --  2.8*   GFR: Estimated Creatinine Clearance: 49.5 mL/min (A) (by C-G formula based on SCr of 1.84 mg/dL (H)). Recent Labs  Lab 10/23/19 0538 10/24/19 0426 10/25/19 0419 10/25/19 0424 10/27/19 0400 10/28/19 0500  PROCALCITON 0.29 5.83  --   --   --   --   WBC 12.0* 12.5* 7.5  --  6.4 8.2  LATICACIDVEN  --   --   --  1.7  --   --     Liver Function Tests: Recent Labs  Lab 10/22/19 0541 10/24/19 0426 10/25/19 0419  AST 21 33 29  ALT 24 20 18   ALKPHOS 47 42 46  BILITOT 0.3 0.6 0.3  PROT 7.7 5.9* 6.0*  ALBUMIN 2.4* 1.8* 1.5*   No results for input(s): LIPASE, AMYLASE in the last 168 hours. Recent Labs  Lab 10/24/19 0427  AMMONIA 32    ABG    Component Value Date/Time   PHART 7.322 (L) 10/28/2019 0436   PCO2ART 57.6 (H) 10/28/2019 0436   PO2ART 97.0 10/28/2019 0436   HCO3 29.8 (H) 10/28/2019 0436   TCO2 32 10/28/2019 0436   ACIDBASEDEF 3.0 (H) 10/27/2019 2233   O2SAT 97.0 10/28/2019 0436     Coagulation Profile: No results for input(s): INR, PROTIME in the last 168 hours.  Cardiac Enzymes: No results for input(s): CKTOTAL, CKMB, CKMBINDEX, TROPONINI in  the last 168 hours.  HbA1C: HbA1c, POC (controlled diabetic range)  Date/Time Value Ref Range Status  06/06/2018 03:03 PM 5.7 0.0 - 7.0 % Final   Hgb A1c MFr Bld  Date/Time Value Ref Range Status  10/25/2019 04:19 AM 6.7 (H) 4.8 - 5.6 % Final    Comment:    (NOTE) Pre diabetes:          5.7%-6.4% Diabetes:              >6.4% Glycemic control for   <7.0% adults with diabetes   08/19/2019 05:41 PM 6.3 (H) 4.8 - 5.6 % Final    Comment:    (NOTE) Pre diabetes:          5.7%-6.4% Diabetes:              >6.4% Glycemic control for   <7.0% adults with diabetes     CBG: Recent Labs  Lab 10/27/19 0846 10/27/19 1313 10/27/19 1611 10/27/19  1957 10/28/19 0341  GLUCAP 122* 119* 148* 92 142*    Critical care time: 34 minutes    Levy Pupa, MD, PhD 10/28/2019, 7:57 AM [facility] [phone] or if no answer [phone]

## 2019-10-28 NOTE — Progress Notes (Signed)
eLink Physician-Brief Progress Note Patient Name: Russell Carter DOB: 28-Mar-1956 MRN: [id number]   Date of Service  10/28/2019  HPI/Events of Note  Hyperkalemia - K+ = 7.0 --> 5.6.  eICU Interventions  Will order: 1. Kayexalate 30 gm per tube now. 2. Repeat BMP at 12 noon.      Intervention Category Major Interventions: Electrolyte abnormality - evaluation and management  Sue Mcalexander Eugene 10/28/2019, 6:54 AM

## 2019-10-29 DIAGNOSIS — Z7289 Other problems related to lifestyle: Secondary | ICD-10-CM | POA: Diagnosis not present

## 2019-10-29 DIAGNOSIS — R3916 Straining to void: Secondary | ICD-10-CM | POA: Diagnosis not present

## 2019-10-29 DIAGNOSIS — N401 Enlarged prostate with lower urinary tract symptoms: Secondary | ICD-10-CM | POA: Diagnosis not present

## 2019-10-29 DIAGNOSIS — J9601 Acute respiratory failure with hypoxia: Secondary | ICD-10-CM | POA: Diagnosis not present

## 2019-10-29 DIAGNOSIS — N390 Urinary tract infection, site not specified: Secondary | ICD-10-CM

## 2019-10-29 DIAGNOSIS — R404 Transient alteration of awareness: Secondary | ICD-10-CM | POA: Diagnosis not present

## 2019-10-29 LAB — LACTIC ACID, PLASMA: Lactic Acid, Venous: 3.2 mmol/L (ref 0.5–1.9)

## 2019-10-29 LAB — URINE CULTURE: Culture: NO GROWTH

## 2019-10-29 LAB — CBC
HCT: 28.7 % — ABNORMAL LOW (ref 39.0–52.0)
Hemoglobin: 8.2 g/dL — ABNORMAL LOW (ref 13.0–17.0)
MCH: 27.5 pg (ref 26.0–34.0)
MCHC: 28.6 g/dL — ABNORMAL LOW (ref 30.0–36.0)
MCV: 96.3 fL (ref 80.0–100.0)
Platelets: 156 10*3/uL (ref 150–400)
RBC: 2.98 MIL/uL — ABNORMAL LOW (ref 4.22–5.81)
RDW: 16.3 % — ABNORMAL HIGH (ref 11.5–15.5)
WBC: 4.6 10*3/uL (ref 4.0–10.5)
nRBC: 5.9 % — ABNORMAL HIGH (ref 0.0–0.2)

## 2019-10-29 LAB — HEPATITIS PANEL, ACUTE
HCV Ab: NONREACTIVE
Hep A IgM: NONREACTIVE
Hep B C IgM: NONREACTIVE
Hepatitis B Surface Ag: NONREACTIVE

## 2019-10-29 LAB — POCT I-STAT 7, (LYTES, BLD GAS, ICA,H+H)
Acid-base deficit: 3 mmol/L — ABNORMAL HIGH (ref 0.0–2.0)
Bicarbonate: 25 mmol/L (ref 20.0–28.0)
Calcium, Ion: 0.92 mmol/L — ABNORMAL LOW (ref 1.15–1.40)
HCT: 31 % — ABNORMAL LOW (ref 39.0–52.0)
Hemoglobin: 10.5 g/dL — ABNORMAL LOW (ref 13.0–17.0)
O2 Saturation: 63 %
Patient temperature: 36.8
Potassium: 7 mmol/L (ref 3.5–5.1)
Sodium: 142 mmol/L (ref 135–145)
TCO2: 27 mmol/L (ref 22–32)
pCO2 arterial: 56.6 mmHg — ABNORMAL HIGH (ref 32.0–48.0)
pH, Arterial: 7.252 — ABNORMAL LOW (ref 7.350–7.450)
pO2, Arterial: 38 mmHg — CL (ref 83.0–108.0)

## 2019-10-29 LAB — COMPREHENSIVE METABOLIC PANEL
ALT: 1448 U/L — ABNORMAL HIGH (ref 0–44)
AST: 2630 U/L — ABNORMAL HIGH (ref 15–41)
Albumin: 1.3 g/dL — ABNORMAL LOW (ref 3.5–5.0)
Alkaline Phosphatase: 55 U/L (ref 38–126)
Anion gap: 10 (ref 5–15)
BUN: 42 mg/dL — ABNORMAL HIGH (ref 8–23)
CO2: 24 mmol/L (ref 22–32)
Calcium: 6.8 mg/dL — ABNORMAL LOW (ref 8.9–10.3)
Chloride: 111 mmol/L (ref 98–111)
Creatinine, Ser: 1.48 mg/dL — ABNORMAL HIGH (ref 0.61–1.24)
GFR calc Af Amer: 57 mL/min — ABNORMAL LOW (ref >60)
GFR calc non Af Amer: 49 mL/min — ABNORMAL LOW (ref >60)
Glucose, Bld: 144 mg/dL — ABNORMAL HIGH (ref 70–99)
Potassium: 4 mmol/L (ref 3.5–5.1)
Sodium: 145 mmol/L (ref 135–145)
Total Bilirubin: 0.4 mg/dL (ref 0.3–1.2)
Total Protein: 5.3 g/dL — ABNORMAL LOW (ref 6.5–8.1)

## 2019-10-29 LAB — GLUCOSE, CAPILLARY
Glucose-Capillary: 94 mg/dL (ref 70–99)
Glucose-Capillary: 115 mg/dL — ABNORMAL HIGH (ref 70–99)
Glucose-Capillary: 129 mg/dL — ABNORMAL HIGH (ref 70–99)
Glucose-Capillary: 94 mg/dL (ref 70–99)
Glucose-Capillary: 142 mg/dL — ABNORMAL HIGH (ref 70–99)

## 2019-10-29 LAB — HEPARIN LEVEL (UNFRACTIONATED)
Heparin Unfractionated: <0.1 IU/mL — ABNORMAL LOW (ref 0.30–0.70)
Heparin Unfractionated: <0.1 IU/mL — ABNORMAL LOW (ref 0.30–0.70)

## 2019-10-29 LAB — APTT: aPTT: 43 seconds — ABNORMAL HIGH (ref 24–36)

## 2019-10-29 LAB — AMMONIA: Ammonia: 36 umol/L — ABNORMAL HIGH (ref 9–35)

## 2019-10-29 LAB — MAGNESIUM: Magnesium: 2.8 mg/dL — ABNORMAL HIGH (ref 1.7–2.4)

## 2019-10-29 MED ORDER — HEPARIN BOLUS VIA INFUSION
2500.0000 [IU] | Freq: Once | INTRAVENOUS | Status: AC
  Administered 2019-10-29: 18:00:00 2500 [IU] via INTRAVENOUS
  Filled 2019-10-29: qty 2500

## 2019-10-29 MED ORDER — FUROSEMIDE 10 MG/ML IJ SOLN
60.0000 mg | Freq: Once | INTRAMUSCULAR | Status: AC
  Administered 2019-10-29: 60 mg via INTRAVENOUS
  Filled 2019-10-29: qty 6

## 2019-10-29 MED ORDER — MIDAZOLAM HCL 2 MG/2ML IJ SOLN
1.0000 mg | INTRAMUSCULAR | Status: DC | PRN
  Administered 2019-10-29 – 2019-10-31 (×3): 2 mg via INTRAVENOUS
  Filled 2019-10-29 (×4): qty 2

## 2019-10-29 MED ORDER — DEXMEDETOMIDINE HCL IN NACL 400 MCG/100ML IV SOLN
0.4000 ug/kg/h | INTRAVENOUS | Status: DC
  Administered 2019-10-29 (×2): 0.4 ug/kg/h via INTRAVENOUS
  Administered 2019-10-29: 13:00:00 1 ug/kg/h via INTRAVENOUS
  Administered 2019-10-30: 05:00:00 0.6 ug/kg/h via INTRAVENOUS
  Administered 2019-10-30 – 2019-10-31 (×3): 0.8 ug/kg/h via INTRAVENOUS
  Filled 2019-10-29 (×8): qty 100

## 2019-10-29 NOTE — Progress Notes (Signed)
Rate change to 2300u/hr on heparin gtt per pharmacy. They wlll enter next lab recheck.

## 2019-10-29 NOTE — Progress Notes (Signed)
[facility] P[facility]  I called and updated his son   I noticed the significant elevation in his liver enzymes on morning labs (were not back when I looked this morning).  Unclear cause  Will make sure medications are adjusted to minimize further hepatic injury.  Plan repeat LFT in AM, acute hepatitis panel.  Russell Villalba, MD [facility] Pager: [phone] Cell: [phone] If no response, call [phone]

## 2019-10-29 NOTE — Progress Notes (Signed)
ANTICOAGULATION CONSULT NOTE - Follow Up Consult  Pharmacy Consult for Apixaban > Heparin Indication: pulmonary embolus  Patient Measurements: Height: 5\' 11"  (180.3 cm) Weight: 226 lb 13.7 oz (102.9 kg) IBW/kg (Calculated) : 75.3 Heparin Dosing Weight: 96 kg   Vital Signs: Temp: 98.6 F (37 C) (01/07 1600) Temp Source: Axillary (01/07 1600) BP: 122/78 (01/07 1700) Pulse Rate: 74 (01/07 1700)  Labs: Recent Labs    10/26/19 2240 10/27/19 0400 10/27/19 1145 10/28/19 0436 10/28/19 0500 10/28/19 1617 10/29/19 0422 10/29/19 0423 10/29/19 1535  HGB   < > 9.9*   < > 11.6* 8.8*  --  8.2*  --   --   HCT  --  34.2*   < > 34.0* 31.0*  --  28.7*  --   --   PLT  --  144*  --   --  193  --  156  --   --   APTT   < >  --   --   --  52* 45* 43*  --   --   HEPARINUNFRC  --   --   --   --  0.65  --   --  <0.10* <0.10*  CREATININE   < > 1.30*   < >  --  1.84* 1.71* 1.48*  --   --    < > = values in this interval not displayed.    Estimated Creatinine Clearance: 61.6 mL/min (A) (by C-G formula based on SCr of 1.48 mg/dL (H)).   Assessment: 9 YOM admitted to the ICU and intubated for COVID-19 pneumonia on apixaban for h/o PE. Given worsening renal fx, pharmacy was consulted to transition from apixaban to IV heparin.  Heparin level now down to undetectable, PTT 43 sec. Will utilize heparin level going forward. No issues with line or bleeding reported per RN. Hgb down to 8.2.  His heparin came back low again even at this rate and . D/w with Rn and patient doesn't have signs of bleeding. He does have low hgb, however, we will give a small bolus because of his PE.   Goal of Therapy:  Heparin level 0.3-0.7 units/ml Monitor platelets by anticoagulation protocol: Yes   Plan:  -Heparin bolus 2500 units x1 -Increase heparin drip to 2600 units/hr -Check 6 hr heparin level -Monitor daily HL and s/s of bleeding   76, PharmD, BCIDP, AAHIVP, CPP Infectious Disease Pharmacist 10/29/2019  5:40 PM

## 2019-10-29 NOTE — Progress Notes (Signed)
RT NOTE:  Vent changed out due to O2 SENSOR ERROR.

## 2019-10-29 NOTE — Progress Notes (Signed)
ANTICOAGULATION CONSULT NOTE - Follow Up Consult  Pharmacy Consult for Apixaban > Heparin Indication: pulmonary embolus  Patient Measurements: Height: 5\' 11"  (180.3 cm) Weight: 226 lb 13.7 oz (102.9 kg) IBW/kg (Calculated) : 75.3 Heparin Dosing Weight: 96 kg   Vital Signs: Temp: 98.8 F (37.1 C) (01/07 0400) Temp Source: Oral (01/07 0400) BP: 98/64 (01/07 0515) Pulse Rate: 93 (01/07 0515)  Labs: Recent Labs    10/26/19 2240 10/27/19 0337 10/27/19 0400 10/27/19 1145 10/27/19 2230 10/28/19 0436 10/28/19 0500 10/28/19 1617 10/29/19 0422 10/29/19 0423  HGB  --   --  9.9*   < >  --  11.6* 8.8*  --  8.2*  --   HCT  --   --  34.2*   < >  --  34.0* 31.0*  --  28.7*  --   PLT  --   --  144*  --   --   --  193  --  156  --   APTT 43*   < >  --   --   --   --  52* 45* 43*  --   HEPARINUNFRC 1.06*  --   --   --   --   --  0.65  --   --  <0.10*  CREATININE  --    < > 1.30*  --  2.11*  --  1.84* 1.71*  --   --    < > = values in this interval not displayed.    Estimated Creatinine Clearance: 53.3 mL/min (A) (by C-G formula based on SCr of 1.71 mg/dL (H)).   Assessment: 26 YOM admitted to the ICU and intubated for COVID-19 pneumonia on apixaban for h/o PE. Given worsening renal fx, pharmacy was consulted to transition from apixaban to IV heparin.  Heparin level now down to undetectable, PTT 43 sec. Will utilize heparin level going forward. No issues with line or bleeding reported per RN. Hgb down to 8.2.  Goal of Therapy:  Heparin level 0.3-0.7 units/ml Monitor platelets by anticoagulation protocol: Yes   Plan:  -Increase heparin infusion rate to 2300 units/hr -Will f/u heparin level in 8 hrs -Monitor daily HL and s/s of bleeding   77, PharmD, BCPS Please see amion for complete clinical pharmacist phone list 10/29/2019 5:49 AM

## 2019-10-29 NOTE — Progress Notes (Signed)
NAME:  Russell Carter, MRN:  [id number], DOB:  Nov 01, 1955, LOS: 27 ADMISSION DATE:  10/20/2019, CONSULTATION DATE:  1/1 REFERRING MD:  Sharon Seller, CHIEF COMPLAINT:  Dyspnea   Brief History   64 y/o male treated for COVID pneumonia in early December, re-admitted 12/11 in setting of worsening respiratory failure with hypoxemia.  Treated again with remdesivir and steroids.  Also treated for UTI/pneumonia.  Intubated on 1/1 in setting of worsening hypoxemia, had a PEA arrest.    Past Medical History  CAD HTN PE DM2 Seizure disorder Bipolar Polysubstance OA Gout CVA Migraine GERD   Significant Hospital Events   Treated for COVID-19 pneumonia 12/4-12/8 [name], discharged on room air 12/11 readmit via [name] ED, treated again with remdesivir and Decadron 12/14 evolving encephalopathy 12/16 unwitnessed fall, subsequent CT head unrevealing 12/23 psych consult, Ativan, Cogentin, Haldol recommended 12/31 more sedate, less interactive, some evolving respiratory distress, fevers 1/1 continued encephalopathy, fever, increased work of breathing 1/1 intubation, brief PEA arrest  Consults:    Procedures:  1/1 ETT >   Significant Diagnostic Tests:  Head CT 12/16 >> no acute abnormality Head CT 12/22 >> no change, no acute abnormality KUB 12/29 >> feeding tube placed appropriately in the stomach  Micro Data:  MRSA screen 1/1 >> negative Urine 12/26 >> negative Urine 12/27 >> negative Urine 1/1 >> negative Blood 1/1 >>  Respiratory 1/2 >> few GPC >> normal flora Blood 1/6 >> Urine 1/6 >> Respiratory 1/6 >>  Antimicrobials:  Remdesivir course #2 12/12 >> 12/16 Dexamethasone course #2 12/11 >> 12/19 Ceftriaxone 12/25 >> 12/19 Cefepime 1/1 >> 1/2 Zosyn 1/2 >>    Interim history/subjective:  AKI improving Hypoxemia improving Weaning sedation Heavily sedated still  Objective   Blood pressure 118/70, pulse 91, temperature 98.8 F (37.1 C), temperature source Oral, resp.  rate (!) 30, height 5\' 11"  (1.803 m), weight 102.9 kg, SpO2 96 %. CVP:  [8 mmHg-16 mmHg] 10 mmHg  Vent Mode: PRVC FiO2 (%):  [60 %-80 %] 60 % Set Rate:  [30 bmp-35 bmp] 30 bmp Vt Set:  [520 mL] 520 mL PEEP:  [10 cmH20-12 cmH20] 10 cmH20 Plateau Pressure:  [34 cmH20-41 cmH20] 34 cmH20   Intake/Output Summary (Last 24 hours) at 10/29/2019 0803 Last data filed at 10/29/2019 0600 Gross per 24 hour  Intake 3700.39 ml  Output 2450 ml  Net 1250.39 ml   Filed Weights   10/03/19 1642 10/10/19 0351  Weight: 95.3 kg 102.9 kg    Examination:  General:  In bed on vent HENT: NCAT ETT in place PULM: CTA B, vent supported breathing CV: RRR, no mgr GI: BS+, soft, nontender MSK: normal bulk and tone Neuro: sedated on vent    Resolved Hospital Problem list    Assessment & Plan:  ARDS due to aspiration pneumonia, recent COVID pneumonia: improving No prone Continue ARDS protocol Decrease sedation VAP prevention Furosemide 60mg  IV x1 dose today  Septic shock: resolved Continue zosyn  Lactic acid elevation, unclear cause as not in shock Repeat Lactic  AKI improved Monitor BMET and UOP Replace electrolytes as needed  Hypernatermia Continue free water  DM2 Ssi,  Seizure disorder Depakote, vimpat  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 Change RASS to 0 to -1 Change versed to precedex Continue fentanyl  History of PE Heparin drip   Best practice:  Diet: tube feeding Pain/Anxiety/Delirium protocol (if indicated): as above VAP protocol (if indicated): yes DVT prophylaxis: hep drip GI prophylaxis: famot Glucose control: ssi Mobility:  bed rest Code Status: full Family Communication: will call today Disposition: remain in ICU  Labs   CBC: Recent Labs  Lab 10/24/19 0426 10/25/19 0419 10/27/19 0400 10/27/19 2222 10/27/19 2233 10/28/19 0436 10/28/19 0500 10/29/19 0422  WBC 12.5* 7.5 6.4  --   --   --  8.2 4.6  HGB 11.3* 9.6* 9.9* 8.5* 10.5*  11.6* 8.8* 8.2*  HCT 38.3* 33.8* 34.2* 25.0* 31.0* 34.0* 31.0* 28.7*  MCV 96.2 97.4 97.4  --   --   --  98.7 96.3  PLT 197 162 144*  --   --   --  193 156    Basic Metabolic Panel: Recent Labs  Lab 10/27/19 0400 10/27/19 2230 10/27/19 2233 10/28/19 0436 10/28/19 0500 10/28/19 1617 10/29/19 0422  NA 143 145 142 144 144 147* 145  K 5.3* 5.9* 7.0* 6.0* 5.6* 4.5 4.0  CL 108 111  --   --  111 113* 111  CO2 28 21*  --   --  25 25 24   GLUCOSE 140* 210*  --   --  130* 162* 144*  BUN 27* 37*  --   --  41* 43* 42*  CREATININE 1.30* 2.11*  --   --  1.84* 1.71* 1.48*  CALCIUM 7.4* 6.2*  --   --  6.6* 6.4* 6.8*  MG 2.8*  --   --   --  2.8*  --  2.8*   GFR: Estimated Creatinine Clearance: 61.6 mL/min (A) (by C-G formula based on SCr of 1.48 mg/dL (H)). Recent Labs  Lab 10/23/19 0538 10/24/19 0426 10/25/19 0419 10/25/19 0424 10/27/19 0400 10/28/19 0500 10/28/19 0835 10/29/19 0422 10/29/19 0423  PROCALCITON 0.29 5.83  --   --   --   --   --   --   --   WBC 12.0* 12.5* 7.5  --  6.4 8.2  --  4.6  --   LATICACIDVEN  --   --   --  1.7  --   --  2.4*  --  3.2*    Liver Function Tests: Recent Labs  Lab 10/24/19 0426 10/25/19 0419 10/29/19 0422  AST 33 29 2,630*  ALT 20 18 1,448*  ALKPHOS 42 46 55  BILITOT 0.6 0.3 0.4  PROT 5.9* 6.0* 5.3*  ALBUMIN 1.8* 1.5* 1.3*   No results for input(s): LIPASE, AMYLASE in the last 168 hours. Recent Labs  Lab 10/24/19 0427 10/29/19 0423  AMMONIA 32 36*    ABG    Component Value Date/Time   PHART 7.322 (L) 10/28/2019 0436   PCO2ART 57.6 (H) 10/28/2019 0436   PO2ART 97.0 10/28/2019 0436   HCO3 29.8 (H) 10/28/2019 0436   TCO2 32 10/28/2019 0436   ACIDBASEDEF 3.0 (H) 10/27/2019 2233   O2SAT 97.0 10/28/2019 0436     Coagulation Profile: No results for input(s): INR, PROTIME in the last 168 hours.  Cardiac Enzymes: No results for input(s): CKTOTAL, CKMB, CKMBINDEX, TROPONINI in the last 168 hours.  HbA1C: HbA1c, POC (controlled  diabetic range)  Date/Time Value Ref Range Status  06/06/2018 03:03 PM 5.7 0.0 - 7.0 % Final   Hgb A1c MFr Bld  Date/Time Value Ref Range Status  10/25/2019 04:19 AM 6.7 (H) 4.8 - 5.6 % Final    Comment:    (NOTE) Pre diabetes:          5.7%-6.4% Diabetes:              >6.4% Glycemic control for   <7.0% adults  with diabetes   08/19/2019 05:41 PM 6.3 (H) 4.8 - 5.6 % Final    Comment:    (NOTE) Pre diabetes:          5.7%-6.4% Diabetes:              >6.4% Glycemic control for   <7.0% adults with diabetes     CBG: Recent Labs  Lab 10/28/19 1206 10/28/19 1619 10/28/19 2001 10/28/19 2324 10/29/19 0342  GLUCAP 141* 147* 119* 123* 94     Critical care time: 30 minutes     Roselie Awkward, MD [facility] Pager: [phone] Cell: [phone] If no response, call [phone]

## 2019-10-30 ENCOUNTER — Inpatient Hospital Stay (HOSPITAL_COMMUNITY): Payer: Medicare HMO

## 2019-10-30 DIAGNOSIS — N401 Enlarged prostate with lower urinary tract symptoms: Secondary | ICD-10-CM

## 2019-10-30 DIAGNOSIS — U071 COVID-19: Secondary | ICD-10-CM | POA: Diagnosis not present

## 2019-10-30 DIAGNOSIS — J9600 Acute respiratory failure, unspecified whether with hypoxia or hypercapnia: Secondary | ICD-10-CM | POA: Diagnosis not present

## 2019-10-30 DIAGNOSIS — L8990 Pressure ulcer of unspecified site, unspecified stage: Secondary | ICD-10-CM | POA: Insufficient documentation

## 2019-10-30 DIAGNOSIS — R109 Unspecified abdominal pain: Secondary | ICD-10-CM | POA: Diagnosis not present

## 2019-10-30 DIAGNOSIS — E440 Moderate protein-calorie malnutrition: Secondary | ICD-10-CM | POA: Insufficient documentation

## 2019-10-30 DIAGNOSIS — R3916 Straining to void: Secondary | ICD-10-CM

## 2019-10-30 LAB — CBC
HCT: 28.2 % — ABNORMAL LOW (ref 39.0–52.0)
Hemoglobin: 8.4 g/dL — ABNORMAL LOW (ref 13.0–17.0)
MCH: 28.7 pg (ref 26.0–34.0)
MCHC: 29.8 g/dL — ABNORMAL LOW (ref 30.0–36.0)
MCV: 96.2 fL (ref 80.0–100.0)
Platelets: 175 10*3/uL (ref 150–400)
RBC: 2.93 MIL/uL — ABNORMAL LOW (ref 4.22–5.81)
RDW: 16.3 % — ABNORMAL HIGH (ref 11.5–15.5)
WBC: 5.7 10*3/uL (ref 4.0–10.5)
nRBC: 4.7 % — ABNORMAL HIGH (ref 0.0–0.2)

## 2019-10-30 LAB — POCT I-STAT 7, (LYTES, BLD GAS, ICA,H+H)
Acid-Base Excess: 2 mmol/L (ref 0.0–2.0)
Bicarbonate: 26.4 mmol/L (ref 20.0–28.0)
Calcium, Ion: 1.05 mmol/L — ABNORMAL LOW (ref 1.15–1.40)
HCT: 27 % — ABNORMAL LOW (ref 39.0–52.0)
Hemoglobin: 9.2 g/dL — ABNORMAL LOW (ref 13.0–17.0)
O2 Saturation: 99 %
Patient temperature: 37.1
Potassium: 4 mmol/L (ref 3.5–5.1)
Sodium: 150 mmol/L — ABNORMAL HIGH (ref 135–145)
TCO2: 28 mmol/L (ref 22–32)
pCO2 arterial: 40.5 mmHg (ref 32.0–48.0)
pH, Arterial: 7.423 (ref 7.350–7.450)
pO2, Arterial: 135 mmHg — ABNORMAL HIGH (ref 83.0–108.0)

## 2019-10-30 LAB — PROTIME-INR
INR: 1.3 — ABNORMAL HIGH (ref 0.8–1.2)
Prothrombin Time: 16.4 seconds — ABNORMAL HIGH (ref 11.4–15.2)

## 2019-10-30 LAB — HEPATIC FUNCTION PANEL
ALT: 1931 U/L — ABNORMAL HIGH (ref 0–44)
AST: 2745 U/L — ABNORMAL HIGH (ref 15–41)
Albumin: 1.2 g/dL — ABNORMAL LOW (ref 3.5–5.0)
Alkaline Phosphatase: 70 U/L (ref 38–126)
Bilirubin, Direct: 0.1 mg/dL (ref 0.0–0.2)
Indirect Bilirubin: 0.2 mg/dL — ABNORMAL LOW (ref 0.3–0.9)
Total Bilirubin: 0.3 mg/dL (ref 0.3–1.2)
Total Protein: 5.6 g/dL — ABNORMAL LOW (ref 6.5–8.1)

## 2019-10-30 LAB — VALPROIC ACID LEVEL: Valproic Acid Lvl: 18 ug/mL — ABNORMAL LOW (ref 50.0–100.0)

## 2019-10-30 LAB — GLUCOSE, CAPILLARY
Glucose-Capillary: 115 mg/dL — ABNORMAL HIGH (ref 70–99)
Glucose-Capillary: 117 mg/dL — ABNORMAL HIGH (ref 70–99)
Glucose-Capillary: 146 mg/dL — ABNORMAL HIGH (ref 70–99)
Glucose-Capillary: 162 mg/dL — ABNORMAL HIGH (ref 70–99)
Glucose-Capillary: 93 mg/dL (ref 70–99)
Glucose-Capillary: 93 mg/dL (ref 70–99)
Glucose-Capillary: 94 mg/dL (ref 70–99)

## 2019-10-30 LAB — HEPARIN LEVEL (UNFRACTIONATED)
Heparin Unfractionated: <0.1 IU/mL — ABNORMAL LOW (ref 0.30–0.70)
Heparin Unfractionated: 0.58 IU/mL (ref 0.30–0.70)
Heparin Unfractionated: 0.76 IU/mL — ABNORMAL HIGH (ref 0.30–0.70)

## 2019-10-30 LAB — LACTIC ACID, PLASMA
Lactic Acid, Venous: 4.1 mmol/L (ref 0.5–1.9)
Lactic Acid, Venous: 3.2 mmol/L (ref 0.5–1.9)

## 2019-10-30 MED ORDER — SODIUM CHLORIDE 0.9% FLUSH
10.0000 mL | Freq: Two times a day (BID) | INTRAVENOUS | Status: DC
  Administered 2019-10-30 – 2019-11-01 (×4): 10 mL

## 2019-10-30 MED ORDER — SODIUM CHLORIDE 0.9% FLUSH
10.0000 mL | Freq: Two times a day (BID) | INTRAVENOUS | Status: DC
  Administered 2019-10-30 – 2019-11-01 (×3): 10 mL

## 2019-10-30 MED ORDER — SODIUM CHLORIDE 0.9% FLUSH: 10.0000 mL | INTRAVENOUS | Status: DC | PRN

## 2019-10-30 MED ORDER — HEPARIN BOLUS VIA INFUSION
3000.0000 [IU] | Freq: Once | INTRAVENOUS | Status: AC
  Administered 2019-10-30: 07:00:00 3000 [IU] via INTRAVENOUS
  Filled 2019-10-30: qty 3000

## 2019-10-30 MED ORDER — VALPROIC ACID 250 MG/5ML PO SOLN
500.0000 mg | Freq: Every day | ORAL | Status: DC
  Administered 2019-10-30 – 2019-10-31 (×2): 500 mg
  Filled 2019-10-30 (×2): qty 10

## 2019-10-30 MED ORDER — LACTATED RINGERS IV BOLUS
1000.0000 mL | Freq: Once | INTRAVENOUS | Status: AC
  Administered 2019-10-30: 17:00:00 1000 mL via INTRAVENOUS

## 2019-10-30 MED ORDER — FUROSEMIDE 10 MG/ML IJ SOLN
40.0000 mg | Freq: Four times a day (QID) | INTRAMUSCULAR | Status: AC
  Administered 2019-10-30 (×2): 40 mg via INTRAVENOUS
  Filled 2019-10-30 (×2): qty 4

## 2019-10-30 NOTE — Progress Notes (Signed)
ANTICOAGULATION CONSULT NOTE - Follow Up Consult  Pharmacy Consult for Apixaban > Heparin Indication: pulmonary embolus  Patient Measurements: Height: 5\' 11"  (180.3 cm) Weight: 226 lb 13.7 oz (102.9 kg) IBW/kg (Calculated) : 75.3 Heparin Dosing Weight: 96 kg   Vital Signs: Temp: 98.6 F (37 C) (01/08 2000) Temp Source: Axillary (01/08 2000) BP: 116/70 (01/08 2000) Pulse Rate: 92 (01/08 2000)  Labs: Recent Labs    10/28/19 0500 10/28/19 0500 10/28/19 1617 10/29/19 0422 10/30/19 0300 10/30/19 0445 10/30/19 1424 10/30/19 1425 10/30/19 2033  HGB 8.8*  --   --  8.2*  --   --   --  8.4*  --   HCT 31.0*  --   --  28.7*  --   --   --  28.2*  --   PLT 193  --   --  156  --   --   --  175  --   APTT 52*  --  45* 43*  --   --   --   --   --   LABPROT  --   --   --   --   --  16.4*  --   --   --   INR  --   --   --   --   --  1.3*  --   --   --   HEPARINUNFRC 0.65   < >  --   --  <0.10*  --  0.58  --  0.76*  CREATININE 1.84*  --  1.71* 1.48*  --   --   --   --   --    < > = values in this interval not displayed.    Estimated Creatinine Clearance: 61.6 mL/min (A) (by C-G formula based on SCr of 1.48 mg/dL (H)).   Assessment: 75 YOM admitted to the ICU and intubated for COVID-19 pneumonia on apixaban for h/o PE. Given worsening renal fx, pharmacy was consulted to transition from apixaban to IV heparin.  Heparin level now slightly supratherapeutic (0.76) on gtt at 1700 units/hr. No bleeding noted.  Goal of Therapy:  Heparin level 0.3-0.7 units/ml Monitor platelets by anticoagulation protocol: Yes   Plan:  Decrease heparin to 1600 units/hr  Will f/u daily heparin level   77, PharmD, BCPS Please see amion for complete clinical pharmacist phone list 10/30/2019 10:24 PM

## 2019-10-30 NOTE — Progress Notes (Signed)
NAME:  Russell Carter, MRN:  [id number], DOB:  [birth year], LOS: 28 ADMISSION DATE:  2019/10/22, CONSULTATION DATE:  1/1 REFERRING MD:  Sharon Seller, CHIEF COMPLAINT:  Dyspnea   Brief History   64 y/o male treated for COVID pneumonia in early December, re-admitted 12/11 in setting of worsening respiratory failure with hypoxemia.  Treated again with remdesivir and steroids.  Also treated for UTI/pneumonia.  Intubated on 1/1 in setting of worsening hypoxemia, had a PEA arrest.    Past Medical History  CAD HTN PE DM2 Seizure disorder Bipolar Polysubstance OA Gout CVA Migraine GERD   Significant Hospital Events   Treated for COVID-19 pneumonia 12/4-12/8 [name], discharged on room air 12/11 readmit via [name] long ED, treated again with remdesivir and Decadron 12/14 evolving encephalopathy 12/16 unwitnessed fall, subsequent CT head unrevealing 12/23 psych consult, Ativan, Cogentin, Haldol recommended 12/31 more sedate, less interactive, some evolving respiratory distress, fevers 1/1 continued encephalopathy, fever, increased work of breathing 1/1 intubation, brief PEA arrest  Consults:    Procedures:  1/1 ETT >   Significant Diagnostic Tests:  Head CT 12/16 >> no acute abnormality Head CT 12/22 >> no change, no acute abnormality KUB 12/29 >> feeding tube placed appropriately in the stomach  Micro Data:  MRSA screen 1/1 >> negative Urine 12/26 >> negative Urine 12/27 >> negative Urine 1/1 >> negative Blood 1/1 >>  Respiratory 1/2 >> few GPC >> normal flora Blood 1/6 >> Urine 1/6 >> Respiratory 1/6 >>  Antimicrobials:  Remdesivir course #2 12/12 >> 12/16 Dexamethasone course #2 12/11 >> 12/19 Ceftriaxone 12/25 >> 12/19 Cefepime 1/1 >> 1/2 Zosyn 1/2 >>    Interim history/subjective:   Tachypnea overnight Tachycardia Increase precedex overnight Fentanyl Started levophed, low dose after precedex overnight Oxygenation improving   Objective   Blood pressure (!)  86/58, pulse 93, temperature 99 F (37.2 C), temperature source Oral, resp. rate (!) 37, height 5\' 11"  (1.803 m), weight 102.9 kg, SpO2 97 %. CVP:  [12 mmHg-19 mmHg] 16 mmHg  Vent Mode: PRVC FiO2 (%):  [50 %-60 %] 60 % Set Rate:  [30 bmp] 30 bmp Vt Set:  [520 mL] 520 mL PEEP:  [10 cmH20] 10 cmH20 Plateau Pressure:  [38 cmH20-46 cmH20] 38 cmH20   Intake/Output Summary (Last 24 hours) at 10/30/2019 0706 Last data filed at 10/30/2019 0700 Gross per 24 hour  Intake 4450.22 ml  Output 2540 ml  Net 1910.22 ml   Filed Weights   10/03/19 1642 10/10/19 0351  Weight: 95.3 kg 102.9 kg    Examination:  General:  In bed on vent HENT: NCAT ETT in place PULM: Rhonchi B, vent supported breathing CV: RRR, no mgr GI: BS+, soft, nontender MSK: normal bulk and tone Neuro: sedated on vent   Resolved Hospital Problem list    Assessment & Plan:  ARDS due to aspiration pneumonia, recent COVID pneumonia: improving No prone Wean PEEP/FiO2 to keep SaO2 > 85% Wean sedation VAP prevention Lasix again today x2 doses  Elevated transaminases: unclear etiology, non-obstructive pattern, no clear pharmacologic cause, acute hepatitis panel elevated, ammonia normal Repeat in AM RUQ ultrasound with doppler to look for hep artery/vein clot  Septic shock: resolved, empiric aspiration pneumonia coverage zosyn  Lactic acid elevation, unclear cause as not in shock Repeat lactic acid  AKI improved Monitor BMET and UOP Replace electrolytes as needed  Hypernatermia Free water F/u labs today  DM2  SSI  Seizure disorder Depakote, vimpat   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  RASS score 0 to -1 Wean off sedation today and assess if he will withdraw, etc  History of PE Heparin infusion   Best practice:  Diet: tube feeding Pain/Anxiety/Delirium protocol (if indicated): as above VAP protocol (if indicated): yes DVT prophylaxis: hep drip GI prophylaxis: famot Glucose  control: ssi Mobility: bed rest Code Status: full Family Communication: left message for son Disposition: remain in ICU  Labs   CBC: Recent Labs  Lab 10/24/19 0426 10/25/19 0419 10/27/19 0400 10/27/19 2222 10/27/19 2233 10/28/19 0436 10/28/19 0500 10/29/19 0422  WBC 12.5* 7.5 6.4  --   --   --  8.2 4.6  HGB 11.3* 9.6* 9.9* 8.5* 10.5* 11.6* 8.8* 8.2*  HCT 38.3* 33.8* 34.2* 25.0* 31.0* 34.0* 31.0* 28.7*  MCV 96.2 97.4 97.4  --   --   --  98.7 96.3  PLT 197 162 144*  --   --   --  193 175    Basic Metabolic Panel: Recent Labs  Lab 10/27/19 0400 10/27/19 2230 10/27/19 2233 10/28/19 0436 10/28/19 0500 10/28/19 1617 10/29/19 0422  NA 143 145 142 144 144 147* 145  K 5.3* 5.9* 7.0* 6.0* 5.6* 4.5 4.0  CL 108 111  --   --  111 113* 111  CO2 28 21*  --   --  25 25 24   GLUCOSE 140* 210*  --   --  130* 162* 144*  BUN 27* 37*  --   --  41* 43* 42*  CREATININE 1.30* 2.11*  --   --  1.84* 1.71* 1.48*  CALCIUM 7.4* 6.2*  --   --  6.6* 6.4* 6.8*  MG 2.8*  --   --   --  2.8*  --  2.8*   GFR: Estimated Creatinine Clearance: 61.6 mL/min (A) (by C-G formula based on SCr of 1.48 mg/dL (H)). Recent Labs  Lab 10/24/19 0426 10/25/19 0419 10/25/19 0424 10/27/19 0400 10/28/19 0500 10/28/19 0835 10/29/19 0422 10/29/19 0423  PROCALCITON 5.83  --   --   --   --   --   --   --   WBC 12.5* 7.5  --  6.4 8.2  --  4.6  --   LATICACIDVEN  --   --  1.7  --   --  2.4*  --  3.2*    Liver Function Tests: Recent Labs  Lab 10/24/19 0426 10/25/19 0419 10/29/19 0422 10/30/19 0445  AST 33 29 2,630* PENDING  ALT 20 18 1,448* 1,931*  ALKPHOS 42 46 55 70  BILITOT 0.6 0.3 0.4 0.3  PROT 5.9* 6.0* 5.3* 5.6*  ALBUMIN 1.8* 1.5* 1.3* 1.2*   No results for input(s): LIPASE, AMYLASE in the last 168 hours. Recent Labs  Lab 10/24/19 0427 10/29/19 0423  AMMONIA 32 36*    ABG    Component Value Date/Time   PHART 7.322 (L) 10/28/2019 0436   PCO2ART 57.6 (H) 10/28/2019 0436   PO2ART 97.0  10/28/2019 0436   HCO3 29.8 (H) 10/28/2019 0436   TCO2 32 10/28/2019 0436   ACIDBASEDEF 3.0 (H) 10/27/2019 2233   O2SAT 97.0 10/28/2019 0436     Coagulation Profile: No results for input(s): INR, PROTIME in the last 168 hours.  Cardiac Enzymes: No results for input(s): CKTOTAL, CKMB, CKMBINDEX, TROPONINI in the last 168 hours.  HbA1C: HbA1c, POC (controlled diabetic range)  Date/Time Value Ref Range Status  06/06/2018 03:03 PM 5.7 0.0 - 7.0 % Final   Hgb A1c MFr Bld  Date/Time Value Ref Range  Status  10/25/2019 04:19 AM 6.7 (H) 4.8 - 5.6 % Final    Comment:    (NOTE) Pre diabetes:          5.7%-6.4% Diabetes:              >6.4% Glycemic control for   <7.0% adults with diabetes   08/19/2019 05:41 PM 6.3 (H) 4.8 - 5.6 % Final    Comment:    (NOTE) Pre diabetes:          5.7%-6.4% Diabetes:              >6.4% Glycemic control for   <7.0% adults with diabetes     CBG: Recent Labs  Lab 10/29/19 0748 10/29/19 1156 10/29/19 1550 10/29/19 2033 10/30/19 0346  GLUCAP 115* 129* 94 142* 93     Critical care time: 35 minutes     Heber Allensworth, MD [place] [facility] Pager: [phone] Cell: [phone] If no response, call [phone]

## 2019-10-30 NOTE — Progress Notes (Signed)
Patient's breathing is tachypneic, RR 45-51, Peak pressure 50, spoke to e-link nurse and notified of patient status and interventions taken, will continue to monitor and increase continuous infusions as needed.  

## 2019-10-30 NOTE — Progress Notes (Signed)
eLink Physician-Brief Progress Note Patient Name: Russell Carter DOB: 04/06/1956 MRN: [id number]   Date of Service  10/30/2019  HPI/Events of Note  Hypopoxia - Sat decreased to 72% transiently requiring increase in FiO2 form 40% to 100%.  eICU Interventions  Will order: 1. Portable CXR STAT. 2. ABG STAT.     Intervention Category Major Interventions: Hypoxemia - evaluation and management  Lenell Antu 10/30/2019, 10:54 PM

## 2019-10-30 NOTE — Progress Notes (Signed)
[facility]  Notified of rising lactic acid, no clear sign of sepsis or shock (relatively speaking compared to his markedly abnormal vital signs).  Could be due to combined hepatic and renal failure, but worrisome for ischemic bowel.  CT ordered 1 LR ordered Repeat lactic acid ordered  Heber Malibu, MD [place] [facility] Pager: [phone] Cell: [phone] If no response, call [phone]

## 2019-10-30 NOTE — Progress Notes (Signed)
ANTICOAGULATION CONSULT NOTE - Follow Up Consult  Pharmacy Consult for Apixaban > Heparin Indication: pulmonary embolus  Patient Measurements: Height: 5\' 11"  (180.3 cm) Weight: 226 lb 13.7 oz (102.9 kg) IBW/kg (Calculated) : 75.3 Heparin Dosing Weight: 96 kg   Vital Signs: Temp: 98 F (36.7 C) (01/08 1200) BP: 89/58 (01/08 0900) Pulse Rate: 88 (01/08 0900)  Labs: Recent Labs    10/28/19 0500 10/28/19 0500 10/28/19 1617 10/29/19 0422 10/29/19 1535 10/30/19 0300 10/30/19 0445 10/30/19 1424 10/30/19 1425  HGB 8.8*  --   --  8.2*  --   --   --   --  8.4*  HCT 31.0*  --   --  28.7*  --   --   --   --  28.2*  PLT 193  --   --  156  --   --   --   --  175  APTT 52*  --  45* 43*  --   --   --   --   --   LABPROT  --   --   --   --   --   --  16.4*  --   --   INR  --   --   --   --   --   --  1.3*  --   --   HEPARINUNFRC 0.65   < >  --   --  <0.10* <0.10*  --  0.58  --   CREATININE 1.84*  --  1.71* 1.48*  --   --   --   --   --    < > = values in this interval not displayed.    Estimated Creatinine Clearance: 61.6 mL/min (A) (by C-G formula based on SCr of 1.48 mg/dL (H)).   Assessment: 21 YOM admitted to the ICU and intubated for COVID-19 pneumonia on apixaban for h/o PE. Given worsening renal fx, pharmacy was consulted to transition from apixaban to IV heparin.  Heparin level remains undetectable on 2500 units/hr this AM and infusion was increased to 3000 units/hr. Now noted that heparin IV line had infiltrated. No s/s of overt bleeding noted.   Heparin level came back therapeutic tonight at 0.58. We will continue the same rate and f/u with level to confirm tonight.   Goal of Therapy:  Heparin level 0.3-0.7 units/ml Monitor platelets by anticoagulation protocol: Yes   Plan:  Continue heparin at 1700 units/hr  Check 6 hr heparin level tonight to confirm Daily HL and CBC  77, PharmD, BCIDP, AAHIVP, CPP Infectious Disease Pharmacist 10/30/2019 6:08  PM

## 2019-10-30 NOTE — Progress Notes (Signed)
PT Cancellation Note  Patient Details Name: Russell Carter MRN: [id number] DOB: [birth year]   Cancelled Treatment:    Reason Eval/Treat Not Completed: Patient not medically ready, will follow up 1/11.   Rada Hay 10/30/2019, 6:51 AM Blanchard Kelch PT Acute Rehabilitation Services Pager [phone] Office [phone]

## 2019-10-30 NOTE — Progress Notes (Signed)
ANTICOAGULATION CONSULT NOTE - Follow Up Consult  Pharmacy Consult for Apixaban > Heparin Indication: pulmonary embolus  Patient Measurements: Height: 5\' 11"  (180.3 cm) Weight: 226 lb 13.7 oz (102.9 kg) IBW/kg (Calculated) : 75.3 Heparin Dosing Weight: 96 kg   Vital Signs: Temp: 99 F (37.2 C) (01/08 0415) Temp Source: Oral (01/08 0415) BP: 94/57 (01/08 0445) Pulse Rate: 96 (01/08 0445)  Labs: Recent Labs    10/28/19 0436 10/28/19 0436 10/28/19 0500 10/28/19 1617 10/29/19 0422 10/29/19 0423 10/29/19 1535 10/30/19 0300  HGB 11.6*  --  8.8*  --  8.2*  --   --   --   HCT 34.0*  --  31.0*  --  28.7*  --   --   --   PLT  --   --  193  --  156  --   --   --   APTT  --   --  52* 45* 43*  --   --   --   HEPARINUNFRC  --    < > 0.65  --   --  <0.10* <0.10* <0.10*  CREATININE  --   --  1.84* 1.71* 1.48*  --   --   --    < > = values in this interval not displayed.    Estimated Creatinine Clearance: 61.6 mL/min (A) (by C-G formula based on SCr of 1.48 mg/dL (H)).   Assessment: 43 YOM admitted to the ICU and intubated for COVID-19 pneumonia on apixaban for h/o PE. Given worsening renal fx, pharmacy was consulted to transition from apixaban to IV heparin.  Heparin level remains undetectable on 2500 units/hr. No issues with line or bleeding reported per RN.  Goal of Therapy:  Heparin level 0.3-0.7 units/ml Monitor platelets by anticoagulation protocol: Yes   Plan:  -Rebolus heparin bolus 3000 units -Increase heparin drip to 3000 units/hr -Check 6 hr heparin level and CBC  77, PharmD, BCPS Please see amion for complete clinical pharmacist phone list 10/30/2019 6:32 AM

## 2019-10-30 NOTE — Progress Notes (Signed)
Patient transported on vent to CT and returned to ICU without complications.

## 2019-10-30 NOTE — Progress Notes (Signed)
ANTICOAGULATION CONSULT NOTE - Follow Up Consult  Pharmacy Consult for Apixaban > Heparin Indication: pulmonary embolus  Patient Measurements: Height: 5\' 11"  (180.3 cm) Weight: 226 lb 13.7 oz (102.9 kg) IBW/kg (Calculated) : 75.3 Heparin Dosing Weight: 96 kg   Vital Signs: Temp: 98 F (36.7 C) (01/08 1200) BP: 89/58 (01/08 0900) Pulse Rate: 88 (01/08 0900)  Labs: Recent Labs    10/28/19 0500 10/28/19 0500 10/28/19 1617 10/29/19 0422 10/29/19 1535 10/30/19 0300 10/30/19 0445 10/30/19 1424 10/30/19 1425  HGB 8.8*  --   --  8.2*  --   --   --   --  8.4*  HCT 31.0*  --   --  28.7*  --   --   --   --  28.2*  PLT 193  --   --  156  --   --   --   --  175  APTT 52*  --  45* 43*  --   --   --   --   --   LABPROT  --   --   --   --   --   --  16.4*  --   --   INR  --   --   --   --   --   --  1.3*  --   --   HEPARINUNFRC 0.65   < >  --   --  <0.10* <0.10*  --  0.58  --   CREATININE 1.84*  --  1.71* 1.48*  --   --   --   --   --    < > = values in this interval not displayed.    Estimated Creatinine Clearance: 61.6 mL/min (A) (by C-G formula based on SCr of 1.48 mg/dL (H)).   Assessment: 67 YOM admitted to the ICU and intubated for COVID-19 pneumonia on apixaban for h/o PE. Given worsening renal fx, pharmacy was consulted to transition from apixaban to IV heparin.  Heparin level remains undetectable on 2500 units/hr this AM and infusion was increased to 3000 units/hr. Now noted that heparin IV line had infiltrated. No s/s of overt bleeding noted.   Heparin level came back therapeutic tonight at 0.58 but it was drawn a little early. We will continue the same rate and f/u with level to confirm tonight.   Goal of Therapy:  Heparin level 0.3-0.7 units/ml Monitor platelets by anticoagulation protocol: Yes   Plan:  Continue heparin at 1700 units/hr  Check heparin level at 2000  Daily HL and CBC  77, PharmD, BCIDP, AAHIVP, CPP Infectious Disease Pharmacist 10/30/2019  6:09 PM

## 2019-10-30 NOTE — Progress Notes (Signed)
[facility] Evening rounds  Came to see patient with minimal sedation> has cough, gag, doesn't otherwise respond to pain (was on fentanyl and precedex) CT abdomen results pending Discussed neuro status with neurology, difficult assessment because of hepatic failure, sedation, etc.  MRI would be helpful Will need to consider transfer to [facility] or [facility] for MRI this weekend if lactic acid abnormalities do not prove to represent a larger prolem.  Discussed poor prognosis with his son, advised I don't think he has a good chance of survival.  Son wants to "keep fighting" but after some conversation he acknowledged that he shares my concern for anoxic brain injury.  Plan to get liver doppler tomorrow, assess labs and mental status and consider transfer to [facility] or [facility] for MRI brain and neurology assessment  Heber Commerce, MD [place] [facility] Pager: [phone] Cell: [phone] If no response, call [phone]

## 2019-10-30 NOTE — Progress Notes (Signed)
ANTICOAGULATION CONSULT NOTE - Follow Up Consult  Pharmacy Consult for Apixaban > Heparin Indication: pulmonary embolus  Patient Measurements: Height: 5\' 11"  (180.3 cm) Weight: 226 lb 13.7 oz (102.9 kg) IBW/kg (Calculated) : 75.3 Heparin Dosing Weight: 96 kg   Vital Signs: Temp: 99 F (37.2 C) (01/08 0415) Temp Source: Oral (01/08 0415) BP: 89/58 (01/08 0900) Pulse Rate: 88 (01/08 0900)  Labs: Recent Labs    10/28/19 0436 10/28/19 0436 10/28/19 0500 10/28/19 1617 10/29/19 0422 10/29/19 0423 10/29/19 1535 10/30/19 0300 10/30/19 0445  HGB 11.6*  --  8.8*  --  8.2*  --   --   --   --   HCT 34.0*  --  31.0*  --  28.7*  --   --   --   --   PLT  --   --  193  --  156  --   --   --   --   APTT  --   --  52* 45* 43*  --   --   --   --   LABPROT  --   --   --   --   --   --   --   --  16.4*  INR  --   --   --   --   --   --   --   --  1.3*  HEPARINUNFRC  --    < > 0.65  --   --  <0.10* <0.10* <0.10*  --   CREATININE  --   --  1.84* 1.71* 1.48*  --   --   --   --    < > = values in this interval not displayed.    Estimated Creatinine Clearance: 61.6 mL/min (A) (by C-G formula based on SCr of 1.48 mg/dL (H)).   Assessment: 58 YOM admitted to the ICU and intubated for COVID-19 pneumonia on apixaban for h/o PE. Given worsening renal fx, pharmacy was consulted to transition from apixaban to IV heparin.  Heparin level remains undetectable on 2500 units/hr this AM and infusion was increased to 3000 units/hr. Now noted that heparin IV line had infiltrated. No s/s of overt bleeding noted.   Goal of Therapy:  Heparin level 0.3-0.7 units/ml Monitor platelets by anticoagulation protocol: Yes   Plan:  -Reduce IV heparin at 1700 units/hr without bolus -Check 6 hr heparin level and CBC  77, PharmD., BCPS Clinical Pharmacist Clinical phone for 10/29/18 until 5pm: [phone]

## 2019-10-31 ENCOUNTER — Inpatient Hospital Stay (HOSPITAL_COMMUNITY): Payer: Medicare HMO

## 2019-10-31 DIAGNOSIS — R6521 Severe sepsis with septic shock: Secondary | ICD-10-CM | POA: Diagnosis not present

## 2019-10-31 DIAGNOSIS — E870 Hyperosmolality and hypernatremia: Secondary | ICD-10-CM | POA: Diagnosis not present

## 2019-10-31 DIAGNOSIS — E440 Moderate protein-calorie malnutrition: Secondary | ICD-10-CM | POA: Diagnosis not present

## 2019-10-31 DIAGNOSIS — R404 Transient alteration of awareness: Secondary | ICD-10-CM | POA: Diagnosis not present

## 2019-10-31 DIAGNOSIS — R69 Illness, unspecified: Secondary | ICD-10-CM | POA: Diagnosis not present

## 2019-10-31 DIAGNOSIS — J1282 Pneumonia due to coronavirus disease 2019: Secondary | ICD-10-CM

## 2019-10-31 DIAGNOSIS — R7989 Other specified abnormal findings of blood chemistry: Secondary | ICD-10-CM | POA: Diagnosis not present

## 2019-10-31 DIAGNOSIS — E669 Obesity, unspecified: Secondary | ICD-10-CM | POA: Diagnosis not present

## 2019-10-31 DIAGNOSIS — N390 Urinary tract infection, site not specified: Secondary | ICD-10-CM | POA: Diagnosis not present

## 2019-10-31 DIAGNOSIS — Z7289 Other problems related to lifestyle: Secondary | ICD-10-CM | POA: Diagnosis not present

## 2019-10-31 DIAGNOSIS — G9341 Metabolic encephalopathy: Secondary | ICD-10-CM | POA: Diagnosis not present

## 2019-10-31 DIAGNOSIS — J9601 Acute respiratory failure with hypoxia: Secondary | ICD-10-CM | POA: Diagnosis not present

## 2019-10-31 DIAGNOSIS — J690 Pneumonitis due to inhalation of food and vomit: Secondary | ICD-10-CM | POA: Diagnosis not present

## 2019-10-31 DIAGNOSIS — A419 Sepsis, unspecified organism: Secondary | ICD-10-CM | POA: Diagnosis not present

## 2019-10-31 DIAGNOSIS — G92 Toxic encephalopathy: Secondary | ICD-10-CM | POA: Diagnosis not present

## 2019-10-31 DIAGNOSIS — F1910 Other psychoactive substance abuse, uncomplicated: Secondary | ICD-10-CM

## 2019-10-31 DIAGNOSIS — U071 COVID-19: Principal | ICD-10-CM

## 2019-10-31 DIAGNOSIS — I10 Essential (primary) hypertension: Secondary | ICD-10-CM

## 2019-10-31 LAB — HEPARIN LEVEL (UNFRACTIONATED)
Heparin Unfractionated: 0.77 IU/mL — ABNORMAL HIGH (ref 0.30–0.70)
Heparin Unfractionated: 1.2 IU/mL — ABNORMAL HIGH (ref 0.30–0.70)

## 2019-10-31 LAB — CBC WITH DIFFERENTIAL/PLATELET
Abs Immature Granulocytes: 0.95 10*3/uL — ABNORMAL HIGH (ref 0.00–0.07)
Basophils Absolute: 0 10*3/uL (ref 0.0–0.1)
Basophils Relative: 0 %
Eosinophils Absolute: 0.2 10*3/uL (ref 0.0–0.5)
Eosinophils Relative: 2 %
HCT: 27.3 % — ABNORMAL LOW (ref 39.0–52.0)
Hemoglobin: 7.9 g/dL — ABNORMAL LOW (ref 13.0–17.0)
Immature Granulocytes: 14 %
Lymphocytes Relative: 18 %
Lymphs Abs: 1.3 10*3/uL (ref 0.7–4.0)
MCH: 27.5 pg (ref 26.0–34.0)
MCHC: 28.9 g/dL — ABNORMAL LOW (ref 30.0–36.0)
MCV: 95.1 fL (ref 80.0–100.0)
Monocytes Absolute: 0.5 10*3/uL (ref 0.1–1.0)
Monocytes Relative: 7 %
Neutro Abs: 4.1 10*3/uL (ref 1.7–7.7)
Neutrophils Relative %: 59 %
Platelets: 200 10*3/uL (ref 150–400)
RBC: 2.87 MIL/uL — ABNORMAL LOW (ref 4.22–5.81)
RDW: 16.2 % — ABNORMAL HIGH (ref 11.5–15.5)
WBC: 7.1 10*3/uL (ref 4.0–10.5)
nRBC: 4.7 % — ABNORMAL HIGH (ref 0.0–0.2)

## 2019-10-31 LAB — COMPREHENSIVE METABOLIC PANEL
ALT: 2120 U/L — ABNORMAL HIGH (ref 0–44)
AST: 2634 U/L — ABNORMAL HIGH (ref 15–41)
Albumin: 1.4 g/dL — ABNORMAL LOW (ref 3.5–5.0)
Alkaline Phosphatase: 73 U/L (ref 38–126)
Anion gap: 11 (ref 5–15)
BUN: 52 mg/dL — ABNORMAL HIGH (ref 8–23)
CO2: 29 mmol/L (ref 22–32)
Calcium: 7.2 mg/dL — ABNORMAL LOW (ref 8.9–10.3)
Chloride: 108 mmol/L (ref 98–111)
Creatinine, Ser: 1.51 mg/dL — ABNORMAL HIGH (ref 0.61–1.24)
GFR calc Af Amer: 56 mL/min — ABNORMAL LOW (ref >60)
GFR calc non Af Amer: 48 mL/min — ABNORMAL LOW (ref >60)
Glucose, Bld: 145 mg/dL — ABNORMAL HIGH (ref 70–99)
Potassium: 4.1 mmol/L (ref 3.5–5.1)
Sodium: 148 mmol/L — ABNORMAL HIGH (ref 135–145)
Total Bilirubin: 0.8 mg/dL (ref 0.3–1.2)
Total Protein: 5.7 g/dL — ABNORMAL LOW (ref 6.5–8.1)

## 2019-10-31 LAB — GLUCOSE, CAPILLARY
Glucose-Capillary: 124 mg/dL — ABNORMAL HIGH (ref 70–99)
Glucose-Capillary: 100 mg/dL — ABNORMAL HIGH (ref 70–99)
Glucose-Capillary: 145 mg/dL — ABNORMAL HIGH (ref 70–99)
Glucose-Capillary: 129 mg/dL — ABNORMAL HIGH (ref 70–99)
Glucose-Capillary: 118 mg/dL — ABNORMAL HIGH (ref 70–99)

## 2019-10-31 LAB — AMMONIA: Ammonia: 38 umol/L — ABNORMAL HIGH (ref 9–35)

## 2019-10-31 LAB — TRIGLYCERIDES: Triglycerides: 255 mg/dL — ABNORMAL HIGH (ref <150)

## 2019-10-31 MED ORDER — PROPOFOL 1000 MG/100ML IV EMUL
INTRAVENOUS | Status: AC
  Filled 2019-10-31: qty 100

## 2019-10-31 MED ORDER — ATROPINE SULFATE 1 MG/10ML IJ SOSY
PREFILLED_SYRINGE | INTRAMUSCULAR | Status: AC
  Filled 2019-10-31: qty 10

## 2019-10-31 MED ORDER — CHLORHEXIDINE GLUCONATE CLOTH 2 % EX PADS
6.0000 | MEDICATED_PAD | Freq: Every day | CUTANEOUS | Status: DC
  Administered 2019-10-31: 6 via TOPICAL

## 2019-10-31 MED ORDER — PROPOFOL 1000 MG/100ML IV EMUL
5.0000 ug/kg/min | INTRAVENOUS | Status: DC
  Administered 2019-10-31: 20 ug/kg/min via INTRAVENOUS
  Administered 2019-10-31 – 2019-11-01 (×2): 35 ug/kg/min via INTRAVENOUS
  Administered 2019-11-01 (×2): 50 ug/kg/min via INTRAVENOUS
  Administered 2019-11-01: 11:00:00 25 ug/kg/min via INTRAVENOUS
  Filled 2019-10-31 (×6): qty 100

## 2019-10-31 MED ORDER — HEPARIN (PORCINE) 25000 UT/250ML-% IV SOLN
1200.0000 [IU]/h | INTRAVENOUS | Status: DC
  Administered 2019-10-31 – 2019-11-01 (×2): 1200 [IU]/h via INTRAVENOUS
  Filled 2019-10-31 (×2): qty 250

## 2019-10-31 NOTE — Plan of Care (Signed)
Levo gtt restarted during shift d/t drop in SBP to 60s for required sedation for vent tolerance.  Levo gtt improved and  maintained acceptable SBP on 70mcg/hr.  Prior to increasing sedation, only noted a grimace at times and cough but no gag and no response to obnoxious stimuli.   Problem: Respiratory: Goal: Complications related to the disease process, condition or treatment will be avoided or minimized Outcome: Not Progressing   Problem: Clinical Measurements: Goal: Respiratory complications will improve Outcome: Not Progressing   Problem: Elimination: Goal: Will not experience complications related to bowel motility Outcome: Not Progressing

## 2019-10-31 NOTE — Progress Notes (Signed)
ANTICOAGULATION CONSULT NOTE - Follow Up Consult  Pharmacy Consult for Apixaban > Heparin Indication: pulmonary embolus  Patient Measurements: Height: 5\' 11"  (180.3 cm) Weight: 226 lb 13.7 oz (102.9 kg) IBW/kg (Calculated) : 75.3 Heparin Dosing Weight: 96 kg   Vital Signs: Temp: 98.6 F (37 C) (01/09 0045) Temp Source: Axillary (01/09 0045) BP: 116/63 (01/09 0045) Pulse Rate: 86 (01/09 0045)  Labs: Recent Labs    10/28/19 0500 10/28/19 0500 10/28/19 1617 10/29/19 0422 10/30/19 0445 10/30/19 1424 10/30/19 1425 10/30/19 2033 10/30/19 2314 10/31/19 0403  HGB 8.8*  --   --  8.2*  --   --  8.4*  --  9.2* 7.9*  HCT 31.0*  --   --  28.7*  --   --  28.2*  --  27.0* 27.3*  PLT 193  --   --  156  --   --  175  --   --  200  APTT 52*  --  45* 43*  --   --   --   --   --   --   LABPROT  --   --   --   --  16.4*  --   --   --   --   --   INR  --   --   --   --  1.3*  --   --   --   --   --   HEPARINUNFRC 0.65   < >  --   --   --  0.58  --  0.76*  --  0.77*  CREATININE 1.84*  --  1.71* 1.48*  --   --   --   --   --   --    < > = values in this interval not displayed.    Estimated Creatinine Clearance: 61.6 mL/min (A) (by C-G formula based on SCr of 1.48 mg/dL (H)).   Assessment: 44 YOM admitted to the ICU and intubated for COVID-19 pneumonia on apixaban for h/o PE. Given worsening renal fx, pharmacy was consulted to transition from apixaban to IV heparin.  Heparin level remains slightly supratherapeutic (0.77) on gtt at 1600 units/hr. No bleeding noted.  Goal of Therapy:  Heparin level 0.3-0.7 units/ml Monitor platelets by anticoagulation protocol: Yes   Plan:  Decrease heparin to 1450 units/hr  Will f/u 8 hr heparin level  77, PharmD, BCPS Please see amion for complete clinical pharmacist phone list 10/31/2019 4:58 AM

## 2019-10-31 NOTE — Progress Notes (Addendum)
NAME:  Russell Carter, MRN:  [id number], DOB:  20-Feb-1956, LOS: 34 ADMISSION DATE:  10/18/2019, CONSULTATION DATE:  1/1 REFERRING MD:  Thereasa Solo, CHIEF COMPLAINT:  Dyspnea   Brief History   63 y/o male treated for COVID pneumonia in early December, re-admitted 12/11 in setting of worsening respiratory failure with hypoxemia.  Treated again with remdesivir and steroids.  Also treated for UTI/pneumonia.  Intubated on 1/1 in setting of worsening hypoxemia, had a PEA arrest.    Past Medical History  CAD HTN PE DM2 Seizure disorder Bipolar Polysubstance OA Gout CVA Migraine GERD   Significant Hospital Events   Treated for COVID-19 pneumonia 12/4-12/8 [name], discharged on room air 12/11 readmit via [facility] ED, treated again with remdesivir and Decadron 12/14 evolving encephalopathy 12/16 unwitnessed fall, subsequent CT head unrevealing 12/23 psych consult, Ativan, Cogentin, Haldol recommended 12/31 more sedate, less interactive, some evolving respiratory distress, fevers 1/1 continued encephalopathy, fever, increased work of breathing 1/1 intubation, brief PEA arrest 1/9 brief episode of bradycardia, HR 20s, possible vagal event, also on Precedex, Precedex stopped  Consults:    Procedures:  1/1 ETT >   Significant Diagnostic Tests:  Head CT 12/16 >> no acute abnormality Head CT 12/22 >> no change, no acute abnormality KUB 12/29 >> feeding tube placed appropriately in the stomach CT ABd: No ischemic bowel  Micro Data:  MRSA screen 1/1 >> negative Urine 12/26 >> negative Urine 12/27 >> negative Urine 1/1 >> negative Blood 1/1 >> NGTD Respiratory 1/2 >> few GPC >> normal flora Blood 1/6 >> NGTD Urine 1/6 >> NGTD Respiratory 1/6 >> normal flora   Antimicrobials:  Remdesivir course #2 12/12 >> 12/16 Dexamethasone course #2 12/11 >> 12/19 Ceftriaxone 12/25 >> 12/19 Cefepime 1/1 >> 1/2 Zosyn 1/2 >> 1/8   Interim history/subjective:   Bradycardic event this  morning.  Patient was coughing on mechanical support.  Precedex infusing.  Patient's heart rate dropped to 20s.  Concerning for vagal event.  Precedex stopped.  Heart rate recovered to mid 60s low 70s.  Objective   Blood pressure (!) 99/58, pulse 66, temperature 98.6 F (37 C), temperature source Axillary, resp. rate (!) 24, height 5\' 11"  (1.803 m), weight 102.9 kg, SpO2 100 %. CVP:  [14 mmHg-21 mmHg] 14 mmHg  Vent Mode: PRVC FiO2 (%):  [40 %-100 %] 60 % Set Rate:  [24 bmp-30 bmp] 24 bmp Vt Set:  [520 mL] 520 mL PEEP:  [10 cmH20] 10 cmH20 Plateau Pressure:  [37 cmH20-41 cmH20] 41 cmH20   Intake/Output Summary (Last 24 hours) at 10/31/2019 0645 Last data filed at 10/31/2019 0600 Gross per 24 hour  Intake 2385.49 ml  Output 4275 ml  Net -1889.51 ml   Filed Weights   10/03/19 1642 10/10/19 0351  Weight: 95.3 kg 102.9 kg    Examination:  General: Intubated on mechanical life support HENT: NCAT, endotracheal tube in place PULM: Bilateral ventilated breath sounds CV: Regular rate and rhythm, S1-S2 GI: Bowel sounds present, soft nontender MSK: Normal bulk and tone Neuro: Sedated on mechanical ventilation unresponsive, no nystagmus, no blink to attack, minimal response to pain     Resolved Hospital Problem list    Assessment & Plan:   ARDS due to aspiration pneumonia, recent COVID pneumonia: improving No plans for proning today. Oxygenation improving. Continue to wean FiO2 to maintain SPO2 greater than 88%.  Septic shock - bp now stable  -Off vasopressors, resolved Complete course of zosyn   Lactic acid elevation, unclear cause as not  in shock CT ABD Pelvis no evidence of ischemic bowel Lactic acidosis improving some  Suspect component of type b due to liver dysfunction  Continue to observe Follow liver function  AKI  Continue to follow urine output and serum creatinine  Hypernatermia On FW replacement   DM2 CBGS with SSI   Seizure disorder Depakote + vimpat    EEG pending to r/o subclinical status in the setting of persistent encephalopathy and history seizure disorder   Acute 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 PEA Cardiac Arrest on 1/1 RASS goal 0- -1 Fentanyl ggt only  precedex stopped due to bradycardia  MRI ordered I will discuss with neurology   History of PE On heparin ggt Pharmacy consulted to help with ptt management range    Best practice:  Diet: tube feeding Pain/Anxiety/Delirium protocol (if indicated): as above VAP protocol (if indicated): yes DVT prophylaxis: hep drip GI prophylaxis: famot Glucose control: ssi Mobility: bed rest Code Status: full Family Communication: will call today Disposition: remain in ICU  Labs   CBC: Recent Labs  Lab 10/27/19 0400 10/28/19 0500 10/29/19 0422 10/30/19 1425 10/30/19 2314 10/31/19 0403  WBC 6.4 8.2 4.6 5.7  --  7.1  NEUTROABS  --   --   --   --   --  4.1  HGB 9.9* 8.8* 8.2* 8.4* 9.2* 7.9*  HCT 34.2* 31.0* 28.7* 28.2* 27.0* 27.3*  MCV 97.4 98.7 96.3 96.2  --  95.1  PLT 144* 193 156 175  --  200    Basic Metabolic Panel: Recent Labs  Lab 10/27/19 0400 10/27/19 2230 10/28/19 0500 10/28/19 1617 10/29/19 0422 10/30/19 2314 10/31/19 0403  NA 143 145 144 147* 145 150* 148*  K 5.3* 5.9* 5.6* 4.5 4.0 4.0 4.1  CL 108 111 111 113* 111  --  108  CO2 28 21* 25 25 24   --  29  GLUCOSE 140* 210* 130* 162* 144*  --  145*  BUN 27* 37* 41* 43* 42*  --  52*  CREATININE 1.30* 2.11* 1.84* 1.71* 1.48*  --  1.51*  CALCIUM 7.4* 6.2* 6.6* 6.4* 6.8*  --  7.2*  MG 2.8*  --  2.8*  --  2.8*  --   --    GFR: Estimated Creatinine Clearance: 60.3 mL/min (A) (by C-G formula based on SCr of 1.51 mg/dL (H)). Recent Labs  Lab 10/28/19 0500 10/28/19 0835 10/29/19 0422 10/29/19 0423 10/30/19 1422 10/30/19 1425 10/30/19 1925 10/31/19 0403  WBC 8.2  --  4.6  --   --  5.7  --  7.1  LATICACIDVEN  --  2.4*  --  3.2* 4.1*  --  3.2*  --     Liver Function  Tests: Recent Labs  Lab 10/25/19 0419 10/29/19 0422 10/30/19 0445 10/31/19 0403  AST 29 2,630* 2,745* 2,634*  ALT 18 1,448* 1,931* 2,120*  ALKPHOS 46 55 70 73  BILITOT 0.3 0.4 0.3 0.8  PROT 6.0* 5.3* 5.6* 5.7*  ALBUMIN 1.5* 1.3* 1.2* 1.4*   No results for input(s): LIPASE, AMYLASE in the last 168 hours. Recent Labs  Lab 10/29/19 0423  AMMONIA 36*    ABG    Component Value Date/Time   PHART 7.423 10/30/2019 2314   PCO2ART 40.5 10/30/2019 2314   PO2ART 135.0 (H) 10/30/2019 2314   HCO3 26.4 10/30/2019 2314   TCO2 28 10/30/2019 2314   ACIDBASEDEF 3.0 (H) 10/27/2019 2233   O2SAT 99.0 10/30/2019 2314     Coagulation Profile: Recent  Labs  Lab 10/30/19 0445  INR 1.3*    Cardiac Enzymes: No results for input(s): CKTOTAL, CKMB, CKMBINDEX, TROPONINI in the last 168 hours.  HbA1C: HbA1c, POC (controlled diabetic range)  Date/Time Value Ref Range Status  06/06/2018 03:03 PM 5.7 0.0 - 7.0 % Final   Hgb A1c MFr Bld  Date/Time Value Ref Range Status  10/25/2019 04:19 AM 6.7 (H) 4.8 - 5.6 % Final    Comment:    (NOTE) Pre diabetes:          5.7%-6.4% Diabetes:              >6.4% Glycemic control for   <7.0% adults with diabetes   08/19/2019 05:41 PM 6.3 (H) 4.8 - 5.6 % Final    Comment:    (NOTE) Pre diabetes:          5.7%-6.4% Diabetes:              >6.4% Glycemic control for   <7.0% adults with diabetes     CBG: Recent Labs  Lab 10/30/19 1217 10/30/19 1526 10/30/19 2035 10/30/19 2333 10/31/19 0346  GLUCAP 146* 162* 93 115* 124*     This patient is critically ill with multiple organ system failure; which, requires frequent high complexity decision making, assessment, support, evaluation, and titration of therapies. This was completed through the application of advanced monitoring technologies and extensive interpretation of multiple databases. During this encounter critical care time was devoted to patient care services described in this note for 32  minutes.  Josephine Igo, DO [facility] 10/31/2019 6:45 AM

## 2019-10-31 NOTE — Progress Notes (Signed)
ANTICOAGULATION CONSULT NOTE - Follow Up Consult  Pharmacy Consult for Apixaban > Heparin Indication: pulmonary embolus  Patient Measurements: Height: 5\' 11"  (180.3 cm) Weight: 226 lb 13.7 oz (102.9 kg) IBW/kg (Calculated) : 75.3 Heparin Dosing Weight: 96 kg   Vital Signs: Temp: 96.1 F (35.6 C) (01/09 1200) Temp Source: Axillary (01/09 1200) BP: 130/67 (01/09 1600) Pulse Rate: 85 (01/09 1600)  Labs: Recent Labs    10/29/19 0422 10/30/19 0445 10/30/19 1424 10/30/19 1425 10/30/19 2033 10/30/19 2314 10/31/19 0403 10/31/19 1244  HGB 8.2*  --    < > 8.4*  --  9.2* 7.9*  --   HCT 28.7*  --   --  28.2*  --  27.0* 27.3*  --   PLT 156  --   --  175  --   --  200  --   APTT 43*  --   --   --   --   --   --   --   LABPROT  --  16.4*  --   --   --   --   --   --   INR  --  1.3*  --   --   --   --   --   --   HEPARINUNFRC  --   --   --   --  0.76*  --  0.77* 1.20*  CREATININE 1.48*  --   --   --   --   --  1.51*  --    < > = values in this interval not displayed.    Estimated Creatinine Clearance: 60.3 mL/min (A) (by C-G formula based on SCr of 1.51 mg/dL (H)).   Assessment: 23 YOM admitted to the ICU and intubated for COVID-19 pneumonia on apixaban for h/o PE. Given worsening renal fx, pharmacy was consulted to transition from apixaban to IV heparin.  Heparin level this afternoon has trended up despite decreasing the infusion. Per RN, IV heparin is running via left midline and heparin level was drawn from Right PICC after pausing the heparin infusion. Hence, this is likely a true level. No s/s of bleeding noted per RN. H/H is trending down. Plt wnl.   Goal of Therapy:  Heparin level 0.3-0.7 units/ml Monitor platelets by anticoagulation protocol: Yes   Plan:  Hold IV heparin x 1 hour and restart heparin at 1200 units/hr  Will f/u 8 hr heparin level  77, PharmD., BCPS Clinical Pharmacist

## 2019-10-31 NOTE — Progress Notes (Signed)
PCCM:  I called and spoke with Dr. Laurence Slate from neurology.  MRI has been ordered as well as EEG.  Due to the logistics of transfer for Covid patients.  And the need for transportation to either [facility] or [place] long for MRI.  Decision made for transfer to [facility] intensive care unit for further care.  Therefore will be able to obtain both MRI as well as EEG and neurology evaluation at one time.  Josephine Igo, DO [facility] 10/31/2019 1:16 PM

## 2019-11-01 ENCOUNTER — Inpatient Hospital Stay (HOSPITAL_COMMUNITY): Payer: Medicare HMO | Admitting: Registered Nurse

## 2019-11-01 ENCOUNTER — Inpatient Hospital Stay (HOSPITAL_COMMUNITY): Payer: Medicare HMO

## 2019-11-01 ENCOUNTER — Inpatient Hospital Stay (HOSPITAL_COMMUNITY)
Admission: EM | Admit: 2019-11-01 | Discharge: 2019-11-01 | Disposition: A | Discharge status: Home / Self Care | Payer: Medicare HMO | Source: Home / Self Care | Attending: Neurology | Admitting: Neurology

## 2019-11-01 DIAGNOSIS — J690 Pneumonitis due to inhalation of food and vomit: Secondary | ICD-10-CM | POA: Diagnosis not present

## 2019-11-01 DIAGNOSIS — R4182 Altered mental status, unspecified: Secondary | ICD-10-CM | POA: Diagnosis not present

## 2019-11-01 DIAGNOSIS — E440 Moderate protein-calorie malnutrition: Secondary | ICD-10-CM | POA: Diagnosis not present

## 2019-11-01 DIAGNOSIS — E669 Obesity, unspecified: Secondary | ICD-10-CM | POA: Diagnosis not present

## 2019-11-01 DIAGNOSIS — J1282 Pneumonia due to Coronavirus disease 2019: Secondary | ICD-10-CM | POA: Diagnosis not present

## 2019-11-01 DIAGNOSIS — R6521 Severe sepsis with septic shock: Secondary | ICD-10-CM | POA: Diagnosis not present

## 2019-11-01 DIAGNOSIS — J9601 Acute respiratory failure with hypoxia: Secondary | ICD-10-CM | POA: Diagnosis not present

## 2019-11-01 DIAGNOSIS — G9341 Metabolic encephalopathy: Secondary | ICD-10-CM

## 2019-11-01 DIAGNOSIS — Z7289 Other problems related to lifestyle: Secondary | ICD-10-CM | POA: Diagnosis not present

## 2019-11-01 DIAGNOSIS — E870 Hyperosmolality and hypernatremia: Secondary | ICD-10-CM | POA: Diagnosis not present

## 2019-11-01 DIAGNOSIS — I10 Essential (primary) hypertension: Secondary | ICD-10-CM | POA: Diagnosis not present

## 2019-11-01 DIAGNOSIS — R69 Illness, unspecified: Secondary | ICD-10-CM | POA: Diagnosis not present

## 2019-11-01 DIAGNOSIS — I69354 Hemiplegia and hemiparesis following cerebral infarction affecting left non-dominant side: Secondary | ICD-10-CM | POA: Diagnosis not present

## 2019-11-01 DIAGNOSIS — G92 Toxic encephalopathy: Secondary | ICD-10-CM | POA: Diagnosis not present

## 2019-11-01 DIAGNOSIS — U071 COVID-19: Secondary | ICD-10-CM | POA: Diagnosis not present

## 2019-11-01 DIAGNOSIS — R404 Transient alteration of awareness: Secondary | ICD-10-CM | POA: Diagnosis not present

## 2019-11-01 DIAGNOSIS — A419 Sepsis, unspecified organism: Secondary | ICD-10-CM | POA: Diagnosis not present

## 2019-11-01 DIAGNOSIS — N390 Urinary tract infection, site not specified: Secondary | ICD-10-CM | POA: Diagnosis not present

## 2019-11-01 LAB — COMPREHENSIVE METABOLIC PANEL
ALT: 1408 U/L — ABNORMAL HIGH (ref 0–44)
ALT: 978 U/L — ABNORMAL HIGH (ref 0–44)
AST: 691 U/L — ABNORMAL HIGH (ref 15–41)
AST: 261 U/L — ABNORMAL HIGH (ref 15–41)
Albumin: 1.4 g/dL — ABNORMAL LOW (ref 3.5–5.0)
Albumin: 1.1 g/dL — ABNORMAL LOW (ref 3.5–5.0)
Alkaline Phosphatase: 86 U/L (ref 38–126)
Alkaline Phosphatase: 101 U/L (ref 38–126)
Anion gap: 12 (ref 5–15)
Anion gap: 11 (ref 5–15)
BUN: 53 mg/dL — ABNORMAL HIGH (ref 8–23)
BUN: 36 mg/dL — ABNORMAL HIGH (ref 8–23)
CO2: 27 mmol/L (ref 22–32)
CO2: 29 mmol/L (ref 22–32)
Calcium: 7 mg/dL — ABNORMAL LOW (ref 8.9–10.3)
Calcium: 7.6 mg/dL — ABNORMAL LOW (ref 8.9–10.3)
Chloride: 105 mmol/L (ref 98–111)
Chloride: 110 mmol/L (ref 98–111)
Creatinine, Ser: 1.35 mg/dL — ABNORMAL HIGH (ref 0.61–1.24)
Creatinine, Ser: 1.26 mg/dL — ABNORMAL HIGH (ref 0.61–1.24)
GFR calc Af Amer: >60 mL/min (ref >60)
GFR calc Af Amer: >60 mL/min (ref >60)
GFR calc non Af Amer: 55 mL/min — ABNORMAL LOW (ref >60)
GFR calc non Af Amer: 60 mL/min — ABNORMAL LOW (ref >60)
Glucose, Bld: 117 mg/dL — ABNORMAL HIGH (ref 70–99)
Glucose, Bld: 156 mg/dL — ABNORMAL HIGH (ref 70–99)
Potassium: 4.1 mmol/L (ref 3.5–5.1)
Potassium: 5.3 mmol/L — ABNORMAL HIGH (ref 3.5–5.1)
Sodium: 144 mmol/L (ref 135–145)
Sodium: 150 mmol/L — ABNORMAL HIGH (ref 135–145)
Total Bilirubin: 1.2 mg/dL (ref 0.3–1.2)
Total Bilirubin: 0.6 mg/dL (ref 0.3–1.2)
Total Protein: 5.8 g/dL — ABNORMAL LOW (ref 6.5–8.1)
Total Protein: 5.6 g/dL — ABNORMAL LOW (ref 6.5–8.1)

## 2019-11-01 LAB — CBC
HCT: 26.9 % — ABNORMAL LOW (ref 39.0–52.0)
HCT: 29.3 % — ABNORMAL LOW (ref 39.0–52.0)
Hemoglobin: 8 g/dL — ABNORMAL LOW (ref 13.0–17.0)
Hemoglobin: 8.1 g/dL — ABNORMAL LOW (ref 13.0–17.0)
MCH: 28.6 pg (ref 26.0–34.0)
MCH: 28 pg (ref 26.0–34.0)
MCHC: 29.7 g/dL — ABNORMAL LOW (ref 30.0–36.0)
MCHC: 27.6 g/dL — ABNORMAL LOW (ref 30.0–36.0)
MCV: 96.1 fL (ref 80.0–100.0)
MCV: 101.4 fL — ABNORMAL HIGH (ref 80.0–100.0)
Platelets: 213 10*3/uL (ref 150–400)
Platelets: 243 10*3/uL (ref 150–400)
RBC: 2.8 MIL/uL — ABNORMAL LOW (ref 4.22–5.81)
RBC: 2.89 MIL/uL — ABNORMAL LOW (ref 4.22–5.81)
RDW: 16.3 % — ABNORMAL HIGH (ref 11.5–15.5)
RDW: 17 % — ABNORMAL HIGH (ref 11.5–15.5)
WBC: 7.5 10*3/uL (ref 4.0–10.5)
WBC: 12.1 10*3/uL — ABNORMAL HIGH (ref 4.0–10.5)
nRBC: 5.1 % — ABNORMAL HIGH (ref 0.0–0.2)
nRBC: 7.6 % — ABNORMAL HIGH (ref 0.0–0.2)

## 2019-11-01 LAB — GLUCOSE, CAPILLARY
Glucose-Capillary: 111 mg/dL — ABNORMAL HIGH (ref 70–99)
Glucose-Capillary: 118 mg/dL — ABNORMAL HIGH (ref 70–99)
Glucose-Capillary: 123 mg/dL — ABNORMAL HIGH (ref 70–99)
Glucose-Capillary: 125 mg/dL — ABNORMAL HIGH (ref 70–99)
Glucose-Capillary: 134 mg/dL — ABNORMAL HIGH (ref 70–99)
Glucose-Capillary: 135 mg/dL — ABNORMAL HIGH (ref 70–99)

## 2019-11-01 LAB — TROPONIN I (HIGH SENSITIVITY): Troponin I (High Sensitivity): 68 ng/L — ABNORMAL HIGH (ref <18)

## 2019-11-01 LAB — LACTIC ACID, PLASMA: Lactic Acid, Venous: 8.9 mmol/L (ref 0.5–1.9)

## 2019-11-01 LAB — HEPARIN LEVEL (UNFRACTIONATED)
Heparin Unfractionated: 0.5 IU/mL (ref 0.30–0.70)
Heparin Unfractionated: 0.35 IU/mL (ref 0.30–0.70)

## 2019-11-02 ENCOUNTER — Ambulatory Visit: Payer: Medicare HMO | Admitting: Internal Medicine

## 2019-11-02 DIAGNOSIS — G9340 Encephalopathy, unspecified: Secondary | ICD-10-CM | POA: Diagnosis not present

## 2019-11-02 LAB — CULTURE, BLOOD (ROUTINE X 2)
Culture: NO GROWTH
Special Requests: ADEQUATE

## 2019-11-02 LAB — PATHOLOGIST SMEAR REVIEW

## 2019-11-02 MED ORDER — DIVALPROEX SODIUM 125 MG PO CSDR: 500.0000 mg | DELAYED_RELEASE_CAPSULE | Freq: Two times a day (BID) | ORAL | Status: DC

## 2019-11-02 MED FILL — Medication: Qty: 1 | Status: AC

## 2019-11-02 NOTE — Code Documentation (Signed)
  Patient Name: Russell Carter   MRN: [id number]   Date of Birth/ Sex: 03/21/1956 , male      Admission Date: 10/05/2019  Attending Provider: Lupita Leash, MD  Primary Diagnosis: Pneumonia due to COVID-19 virus [U07.1, J12.82]   Indication: Pt was in his usual state of health until this PM, when he was noted to be unresponsive (see RN note from 11-12-19 11:31PM). Code blue was subsequently called. At the time of arrival on scene, ACLS protocol was underway.   Technical Description:  - CPR performance duration:  44 minutes  - Was defibrillation or cardioversion used? No   - Was external pacer placed? No  - Was patient intubated pre/post CPR? Yes   Medications Administered: Y = Yes; Blank = No Amiodarone    Atropine  Y  Calcium    Epinephrine  Y  Lidocaine    Magnesium    Norepinephrine    Phenylephrine    Sodium bicarbonate  Y  Vasopressin    Other    Post CPR evaluation:  - Final Status - Was patient successfully resuscitated ? No   Miscellaneous Information:  - Time of death:  February 12, 2307 on 12-Nov-2019  - Primary team notified?  Yes  - Family Notified? Yes     Katherine Roan, MD   11/08/2019, 12:42 AM

## 2019-11-02 NOTE — Significant Event (Signed)
PCCM Progress note:  Notified that patient had coded in while in MRI and then passed away; it was requested that I speak to the family, PCCM was not notified that a code was in progress.  Per RN report, pt became hypoxic and pulseless in the scanner, ACLS was initiated and ROSC achieved, but then pt lost pulses again.  Family (patient's son and daughter) were updated of these unfortunate events and allowed to visit at the bedside

## 2019-11-02 NOTE — Progress Notes (Signed)
Wasted 150 ml of Fentanyl with Tory Emerald. RN

## 2019-11-05 ENCOUNTER — Telehealth: Payer: Self-pay

## 2019-11-05 NOTE — Telephone Encounter (Signed)
Received a phone call from [facility] @ [employer].   The dc will be signed by Doctor Kendrick Fries and he is working at [employer]. I told Freida Busman this information.

## 2019-11-11 ENCOUNTER — Ambulatory Visit: Payer: Medicaid Other | Admitting: Family Medicine

## 2019-11-23 NOTE — Progress Notes (Signed)
ANTICOAGULATION CONSULT NOTE - Follow Up Consult  Pharmacy Consult for Heparin Indication: pulmonary embolus  Patient Measurements: Height: 5\' 11"  (180.3 cm) Weight: 226 lb 13.7 oz (102.9 kg) IBW/kg (Calculated) : 75.3 Heparin Dosing Weight: 96 kg   Vital Signs: Temp: 97.9 F (36.6 C) (01/10 0000) Temp Source: Axillary (01/10 0000) BP: 111/64 (01/10 0030) Pulse Rate: 87 (01/10 0045)  Labs: Recent Labs    10/29/19 0422 10/30/19 0445 10/30/19 1425 10/30/19 2314 10/31/19 0403 10/31/19 1244 November 13, 2019 0015  HGB 8.2*  --  8.4* 9.2* 7.9*  --   --   HCT 28.7*  --  28.2* 27.0* 27.3*  --   --   PLT 156  --  175  --  200  --   --   APTT 43*  --   --   --   --   --   --   LABPROT  --  16.4*  --   --   --   --   --   INR  --  1.3*  --   --   --   --   --   HEPARINUNFRC  --   --   --   --  0.77* 1.20* 0.50  CREATININE 1.48*  --   --   --  1.51*  --   --     Estimated Creatinine Clearance: 60.3 mL/min (A) (by C-G formula based on SCr of 1.51 mg/dL (H)).   Assessment: 21 YOM admitted to the ICU and intubated for COVID-19 pneumonia on apixaban for h/o PE. Given worsening renal fx, pharmacy was consulted to transition from apixaban to IV heparin.  Heparin level therapeutic (0.5) on gtt at 1200 units/hr. No bleeding noted.  Goal of Therapy:  Heparin level 0.3-0.7 units/ml Monitor platelets by anticoagulation protocol: Yes   Plan:  Continue heparin at 1200 units/hr  Will f/u 8 hr heparin level to confirm remains therapeutic  77, PharmD, BCPS Please see amion for complete clinical pharmacist phone list 2019/11/13 1:43 AM

## 2019-11-23 NOTE — Procedures (Signed)
Patient Name: Russell Carter  MRN: [id number]  Epilepsy Attending: Charlsie Quest  Referring Physician/Provider: Dr Audie Box Date: 10/30/2019 Duration: 23.37 mins  Patient history: 64yo M with seizure disorder now with COVID+ and ams. EEG to evaluate for subclinical seizure  Level of alertness: comatose/sedated   AEDs during EEG study: Vimpat, VPA, propofol  Technical aspects: This EEG study was done with scalp electrodes positioned according to the 10-20 International system of electrode placement. Electrical activity was acquired at a sampling rate of 500Hz  and reviewed with a high frequency filter of 70Hz  and a low frequency filter of 1Hz . EEG data were recorded continuously and digitally stored.   DESCRIPTION: EEG showed continuous low amplitude 3-5hz  theta- delta slowing. Hyperventilation and photic stimulation were not performed.  ABNORMALITY - Continuous slow, generalized  IMPRESSION: This study is suggestive of severe diffuse encephalopathy, non specific to etiology. No seizures or epileptiform discharges were seen throughout the recording.

## 2019-11-23 NOTE — Progress Notes (Signed)
Placed prop and fent back to settings had before WUA, due to asynchrony on vent being observed, will continue to monitor.

## 2019-11-23 NOTE — Progress Notes (Addendum)
During WUA , Pt did not wake up, no movement upon painful stimulation either to any extremity, decreased prop and fent by half and restarted due to patient over breathing vent. Will continue to monitor. Placed prop and fent back to settings first had due to asynchrony being observed.

## 2019-11-23 NOTE — Progress Notes (Signed)
EEG completed, results pending. 

## 2019-11-23 NOTE — Progress Notes (Addendum)
Pt unavailable at this time. [facility] be [facility] to [facility]. Will need MRI. Will do EEG after transfer and MRI when our schedule permits. Neuro notified.

## 2019-11-23 NOTE — Progress Notes (Addendum)
During MRI scan, patient's vent began to alarm then his saturation and HR began to drop rapidly. I was not able to enter the MRI area since I was wearing PPE that was not MRI approved. RT and MRI team entered the room to assess the situation and ultimately a code blue was called. Patient was brought out of the MRI room and RN began chest compressions immediatly. RN did notice copious secretions from mouth and had to be reintubated. Patient did not survive the code. Family notified.

## 2019-11-23 NOTE — Death Summary Note (Signed)
DEATH SUMMARY   Patient Details  Name: Russell Carter MRN: [id number] DOB: [birth year]  Admission/Discharge Information   Admit Date:  10/05/2019  Date of Death: Date of Death: 04-Nov-2019  Time of Death: Time of Death: Feb 03, 2309  Length of Stay: 31  Referring Physician: Marcine Matar, MD   Reason(s) for Hospitalization  64 y/o male treated for COVID pneumonia in early December, re-admitted 10-05-23 in setting of worsening respiratory failure with hypoxemia.  Treated again with remdesivir and steroids.  Also treated for UTI/pneumonia.  Intubated on 1/1 in setting of worsening hypoxemia, had a PEA arrest.  v  Diagnoses  Preliminary cause of death: COVID-19 virus infection Secondary Diagnoses (including complications and co-morbidities):  Principal Problem:   Altered mental status Active Problems:   Essential hypertension   Polysubstance abuse (HCC)   CAD-50% LAD February 03, 2010   Cervical spinal stenosis   Cocaine abuse (HCC)   History of CVA (cerebrovascular accident)   Diabetes mellitus type 2 in nonobese (HCC)   History of pulmonary embolus (PE)   Benign prostatic hyperplasia (BPH) with straining on urination   Alcohol use   Pneumonia due to COVID-19 virus   Acute respiratory failure with hypoxia (HCC)   Obesity (BMI 30-39.9)   Acute lower UTI   Malnutrition of moderate degree   Pressure injury of skin   Brief Hospital Course (including significant findings, care, treatment, and services provided and events leading to death)  [name] is a 64 y.o. year old male who treated for COVID pneumonia in early December, re-admitted 10-05-23 in setting of worsening respiratory failure with hypoxemia.  Treated again with remdesivir and steroids.  Also treated for UTI/pneumonia.  Intubated on 1/1 in setting of worsening hypoxemia, had a PEA arrest.    Treated for COVID-19 pneumonia 12/4-12/8 [name], discharged on room air 10/05/23 readmit via [name] long ED, treated again with remdesivir and  Decadron 12/14 evolving encephalopathy 12/16 unwitnessed fall, subsequent CT head unrevealing 12/23 psych consult, Ativan, Cogentin, Haldol recommended 12/31 more sedate, less interactive, some evolving respiratory distress, fevers 1/1 continued encephalopathy, fever, increased work of breathing 1/1 intubation, brief PEA arrest 1/9 brief episode of bradycardia, HR 20s, possible vagal event, also on Precedex, Precedex stopped  Patient had ongoing persistent encephalopathy after being on mechanical support for several days secondary to COVID-19 pneumonia and ARDS.  Patient's shock state had resolved.  He unfortunately had a cardiac arrest on 1 January.  Patient had persistent encephalopathy and the decision was made for transfer to [facility] to obtain MRI imaging of the brain after consultation with neurology.  At this point upon transfer to the MRI scanner the patient unfortunately suffered a second cardiac arrest in the MRI scanner and did not survive following ACLS.   Pertinent Labs and Studies  Significant Diagnostic Studies CT HEAD WO CONTRAST  Result Date: 10/13/2019 CLINICAL DATA:  Fall in hospital several days ago. Mental status change EXAM: CT HEAD WITHOUT CONTRAST TECHNIQUE: Contiguous axial images were obtained from the base of the skull through the vertex without intravenous contrast. COMPARISON:  CT head 10/07/2019 FINDINGS: Brain: No evidence of acute infarction, hemorrhage, hydrocephalus, extra-axial collection or mass lesion/mass effect. Mild white matter changes consistent with chronic microvascular ischemia. No midline shift. Vascular: Negative for hyperdense vessel Skull: Negative Sinuses/Orbits: Mucosal edema paranasal sinuses.  Negative orbit. Other: None IMPRESSION: No acute abnormality and  no change from 6 days ago. Electronically Signed   By: Marlan Palau M.D.   On:  10/13/2019 16:58   MR BRAIN WO CONTRAST  Result Date: 11/17/2019 CLINICAL  DATA:  Encephalopathy after cardiac arrest EXAM: MRI HEAD WITHOUT CONTRAST TECHNIQUE: Multiplanar, multiecho pulse sequences of the brain and surrounding structures were obtained without intravenous contrast. COMPARISON:  Brain MRI 12/25/2018 FINDINGS: BRAIN: There is no acute infarct, acute hemorrhage or extra-axial collection. Multifocal white matter hyperintensity, most commonly due to chronic ischemic microangiopathy. There is generalized atrophy without lobar predilection. The midline structures are normal. VASCULAR: The major intracranial arterial and venous sinus flow voids are normal. Susceptibility-sensitive sequences show no chronic microhemorrhage or superficial siderosis. SKULL AND UPPER CERVICAL SPINE: Calvarial bone marrow signal is normal. There is no skull base mass. The visualized upper cervical spine and soft tissues are normal. SINUSES/ORBITS: Bilateral mastoid effusions. The orbits are normal. IMPRESSION: 1. No acute intracranial abnormality. 2. Generalized atrophy and chronic ischemic microangiopathy. Electronically Signed   By: Deatra Robinson M.D.   On: 11/04/2019 00:45   DG CHEST PORT 1 VIEW  Result Date: 10/30/2019 CLINICAL DATA:  Acute respiratory failure EXAM: PORTABLE CHEST 1 VIEW COMPARISON:  10/27/2019 FINDINGS: Cardiac shadow is stable. Feeding catheter and endotracheal tube as well as a right-sided PICC line are noted and stable. Diffuse bilateral airspace opacities are again identified throughout both lungs. Postsurgical changes in the cervical spine are seen. IMPRESSION: Stable airspace opacities are again seen unchanged from the prior exam. Tubes and lines as described. Electronically Signed   By: Alcide Clever M.D.   On: 10/30/2019 23:32   DG Chest Port 1 View  Result Date: 10/27/2019 CLINICAL DATA:  Acute respiratory failure. EXAM: PORTABLE CHEST 1 VIEW COMPARISON:  10/25/2019. FINDINGS: Endotracheal tube, feeding tube, right PICC line in stable position. Heart size stable.  Diffuse severe bilateral pulmonary infiltrates/edema unchanged. No pleural effusion or pneumothorax. Prior cervical spine fusion. IMPRESSION: 1.  Lines and tubes stable position. 2.  Diffuse severe bilateral pulmonary infiltrates/edema unchanged. Electronically Signed   By: Maisie Fus  Register   On: 10/27/2019 07:32   DG Chest Port 1 View  Result Date: 10/25/2019 CLINICAL DATA:  Acute respiratory failure with hypoxia. EXAM: PORTABLE CHEST 1 VIEW COMPARISON:  Radiograph 10/23/2019 FINDINGS: Endotracheal tube, enteric tube, and right upper extremity PICC unchanged in position. Persistent low lung volumes with unchanged bilateral heterogeneous airspace disease. Heart is normal in size. No pneumothorax. Surgical hardware in the lower cervical spine is partially included. IMPRESSION: 1. Unchanged bilateral heterogeneous airspace opacities throughout both lungs consistent with pneumonia. 2. Stable support apparatus. Electronically Signed   By: Narda Rutherford M.D.   On: 10/25/2019 06:11   DG Chest Port 1 View  Result Date: 10/23/2019 CLINICAL DATA:  Intubation EXAM: PORTABLE CHEST 1 VIEW COMPARISON:  10/22/2019 FINDINGS: Interval placement of endotracheal tube, tip positioned over the mid trachea. Enteric feeding tube, tip position within the gastric fundus. Right upper extremity PICC. Extensive bilateral heterogeneous and interstitial airspace opacity, unchanged compared to prior examination. IMPRESSION: 1. Interval placement of endotracheal tube, tip positioned over the mid trachea. 2. Extensive bilateral heterogeneous and interstitial airspace opacity, unchanged compared to prior examination, consistent with multifocal infection and/or ARDS. 3. Enteric feeding tube tip is in the gastric fundus. Electronically Signed   By: Lauralyn Primes M.D.   On: 10/23/2019 19:59   DG Chest Port 1 View  Result Date: 10/22/2019 CLINICAL DATA:  64 year old male with history of pneumonia from COVID-19 infection. EXAM: PORTABLE  CHEST 1 VIEW COMPARISON:  Chest x-ray 10/12/2019. FINDINGS: There is a right upper extremity PICC  with tip terminating in the right atrium. Feeding tube extending into the proximal stomach. Lung volumes are low. Worsening patchy multifocal interstitial and airspace disease scattered throughout all aspects of the lungs bilaterally, compatible with progressive multilobar pneumonia. No definite pleural effusions. Pulmonary vasculature is obscured. Heart size is normal. The patient is rotated to the right on today's exam, resulting in distortion of the mediastinal contours and reduced diagnostic sensitivity and specificity for mediastinal pathology. Orthopedic fixation hardware in the cervical spine. IMPRESSION: 1. Severe worsening multilobar bilateral pneumonia, as above. 2. Support apparatus, as above. Electronically Signed   By: Trudie Reed M.D.   On: 10/22/2019 09:13   DG Chest Port 1 View  Result Date: 10/12/2019 CLINICAL DATA:  COVID-19 pneumonia. EXAM: PORTABLE CHEST 1 VIEW COMPARISON:  10/10/2019. FINDINGS: PICC line in stable position. Mediastinum hilar structures are unremarkable. Heart size normal. Diffuse bilateral pulmonary infiltrates are again noted. No interim change. No pleural effusion or pneumothorax. Prior cervical spine fusion. IMPRESSION: 1.  PICC line stable position. 2. Diffuse bilateral pulmonary infiltrates are again noted. No interim change. Electronically Signed   By: Maisie Fus  Register   On: 10/12/2019 06:11   DG Abd Portable 1V  Result Date: 10/20/2019 CLINICAL DATA:  Feeding tube placement EXAM: PORTABLE ABDOMEN - 1 VIEW COMPARISON:  05/06/2018 FINDINGS: Feeding tube is located within the stomach. The tip is in the fundus of the stomach. IMPRESSION: Feeding tube tip in the fundus of the stomach. Electronically Signed   By: Charlett Nose M.D.   On: 10/20/2019 11:07   EEG adult  Result Date: 11/10/2019 Charlsie Quest, MD     11/07/2019 12:35 PM Patient Name: Damauri Minion  MRN: [id number] Epilepsy Attending: Charlsie Quest Referring Physician/Provider: Dr Audie Box Date: 10/29/2019 Duration: 23.37 mins Patient history: 64yo M with seizure disorder now with COVID+ and ams. EEG to evaluate for subclinical seizure Level of alertness: comatose/sedated AEDs during EEG study: Vimpat, VPA, propofol Technical aspects: This EEG study was done with scalp electrodes positioned according to the 10-20 International system of electrode placement. Electrical activity was acquired at a sampling rate of 500Hz  and reviewed with a high frequency filter of 70Hz  and a low frequency filter of 1Hz . EEG data were recorded continuously and digitally stored. DESCRIPTION: EEG showed continuous low amplitude 3-5hz  theta- delta slowing. Hyperventilation and photic stimulation were not performed. ABNORMALITY - Continuous slow, generalized IMPRESSION: This study is suggestive of severe diffuse encephalopathy, non specific to etiology. No seizures or epileptiform discharges were seen throughout the recording.   LIVER DOPPLER  Result Date: 10/31/2019 CLINICAL DATA:  Elevated LFTs, transaminitis EXAM: DUPLEX ULTRASOUND OF LIVER TECHNIQUE: Color and duplex Doppler ultrasound was performed to evaluate the hepatic in-flow and out-flow vessels. COMPARISON:  None. FINDINGS: Portal Vein Velocities Main:  27 cm/sec Right:  29 cm/sec Left:  77 cm/sec Hepatic Vein Velocities Right:  12 cm/sec Middle:  25 cm/sec Left:  21 cm/sec Hepatic Artery Velocity:  130 cm/sec Splenic Vein Velocity:  10.3 cm/sec Varices: Not visualized Ascites: None. Limited exam because of difficulty positioning the patient on a ventilator. Patent portal, hepatic and splenic veins with normal directional flow. Negative for portal vein occlusion or thrombus. Patent IVC. Small subcentimeter gallstone noted measuring only 4 mm. IMPRESSION: Patent portal, hepatic and splenic veins. Negative for portal vein thrombus or occlusion. No significant  finding Electronically Signed   By: .  Shick M.D.   On: 10/31/2019 09:06   CT ABD PELVIS WO CONTRAST W/NEXTRAST ([facility])  Result Date: 10/30/2019 CLINICAL DATA:  COVID-19. Inpatient. Abdominal pain. Rising lactic acid of uncertain etiology. Concern for ischemic bowel. EXAM: CT ABDOMEN AND PELVIS WITHOUT CONTRAST TECHNIQUE: Multidetector CT imaging of the abdomen and pelvis was performed following the standard protocol without IV contrast. COMPARISON:  08/19/2019 CT abdomen/pelvis. FINDINGS: Limited motion degraded scan. Lower chest: Severe patchy consolidation and ground-glass opacity throughout both lung bases. Small dependent left pleural effusion. Coronary atherosclerosis. Tip of superior approach central venous catheter seen at the cavoatrial junction. Hepatobiliary: Normal liver size. No liver mass. Normal gallbladder with no radiopaque cholelithiasis. No biliary ductal dilatation. Pancreas: Normal, with no mass or duct dilation. Spleen: Normal size. No mass. Adrenals/Urinary Tract: Normal adrenals. No renal stones. No hydronephrosis. No contour deforming renal masses. Bladder relatively collapsed by indwelling Foley catheter and grossly normal. Stomach/Bowel: Enteric tube terminates in the gastric fundus. Stomach is nondistended and grossly normal. Normal caliber small bowel with no small bowel wall thickening. Normal appendix. Rectal drainage tube in place. No large bowel wall thickening, diverticulosis or significant pericolonic fat stranding. No pneumatosis. Vascular/Lymphatic: Normal caliber abdominal aorta. No pathologically enlarged lymph nodes in the abdomen or pelvis. Reproductive: Mildly enlarged prostate. Other: No pneumoperitoneum, ascites or focal fluid collection. Musculoskeletal: No aggressive appearing focal osseous lesions. Mild thoracolumbar spondylosis. IMPRESSION: 1. No evidence of acute bowel pathology on this noncontrast CT study. No pneumatosis or pneumoperitoneum. No aortic  atherosclerosis. 2. Severe COVID-19 pneumonia seen at the lung bases. Small dependent left pleural effusion. 3. Enteric tube terminates in the gastric fundus. 4. Coronary atherosclerosis. 5. Mildly enlarged prostate. Electronically Signed   By: Ilona Sorrel M.D.   On: 10/30/2019 17:35    Microbiology No results found for this or any previous visit (from the past 240 hour(s)).  Lab Basic Metabolic Panel: No results for input(s): NA, K, CL, CO2, GLUCOSE, BUN, CREATININE, CALCIUM, MG, PHOS in the last 168 hours. Liver Function Tests: No results for input(s): AST, ALT, ALKPHOS, BILITOT, PROT, ALBUMIN in the last 168 hours. No results for input(s): LIPASE, AMYLASE in the last 168 hours. No results for input(s): AMMONIA in the last 168 hours. CBC: No results for input(s): WBC, NEUTROABS, HGB, HCT, MCV, PLT in the last 168 hours. Cardiac Enzymes: No results for input(s): CKTOTAL, CKMB, CKMBINDEX, TROPONINI in the last 168 hours. Sepsis Labs: No results for input(s): PROCALCITON, WBC, LATICACIDVEN in the last 168 hours.  Procedures/Operations  1/1 ETT  Head CT 12/16 >> no acute abnormality Head CT 12/22 >> no change, no acute abnormality KUB 12/29 >> feeding tube placed appropriately in the stomach CT ABd: No ischemic bowel MRI Brain EEG     Noriel Guthrie L Tiffeny Minchew 11/09/2019, 6:28 PM

## 2019-11-23 NOTE — Progress Notes (Signed)
Pt is being transferred to [facility] by EMS. I have already called report to the receiving RN, Onalee Hua. EMS has safely been able to load up the patient and I had given them report as well. I will now contact the patient's family member to let them know of the transfer.

## 2019-11-23 NOTE — Consult Note (Addendum)
Neurology Consultation Reason for Consult: Altered mental status Referring Physician: Icard, B  CC: Altered mental status  History is obtained from: Chart review  HPI: Russell Carter is a 64 y.o. male with a history of seizures admitted originally 12/4 with Covid pneumonia.  He was subsequently discharged on 12/8 after being treated with remdesivir and steroids.  He represented on 12/11 with worsening shortness of breath.  He was started on remdesivir and Decadron shortly after admission.  It was noted in mid December that he was having some delirium, and some perseveration when answering questions.  It was noted that mental status began declining on 12/14.  Decadron was discontinued on 12/19 given that it was thought it may be contributing to his delirium.  Head CT was performed on admission as well as on 12/16 due to a fall and 12/22 due to continued confusion.  On 12/25 psychiatry was consulted but only say yeah, yeah, yeah, with no meaningful conversation.  On 12/30, is noted that he would answer some short direct questions, but was still confused.  His pulmonary status continued to be compromised with worsening infiltrates throughout this time period.  Continue to be febrile and was intubated on 1/1 due to hypoxemia.  Despite intubation he continued to desaturate and went into PEA arrest shortly after intubation.  ROSC was obtained after 3 minutes.  He was in shock and was started on Neo-Synephrine.  He was on Versed with doses around 5 mg/h from 1/1 until 1/4.  Sedation was paused on 1/4, but he had no improvement in his mental status.  On 1/8, it was noted that he was not awakening.  It was decided to transfer the patient for MRI and EEG.  EEG was performed today which only showed generalized slow activity.  No evidence of ongoing seizure activity.  Due to continued dense encephalopathy, there is concern for anoxic brain injury and he was transferred for his EEG and MRI.   He has been on Zosyn for  aspiration pneumonia, brief course of cefepime but only received 2 doses.  He also is on ceftriaxone from 12/19-12/25.   ROS: Unable to obtain due to altered mental status.   Past Medical History:  Diagnosis Date  . Acute kidney injury ([place]) 04/27/2018  . AKI (acute kidney injury) ([place])   . Anginal pain ([place])   . Anxiety   . Arthritis   . Bipolar disorder ([place])   . Blood in stool   . CAD (coronary artery disease)    a. 2011: cath showing mod non-obstructive disease b. 02/2016: cath with 95% stenosis in the Mid RCA. DES placed.  . Cervical stenosis of spine   . Chronic anticoagulation 04/27/2018  . Chronic pain disorder 06/05/2017  . CKD (chronic kidney disease), stage III 05/06/2018  . CKD (chronic kidney disease), stage III 05/06/2018  . Cocaine abuse ([place])    history  . Complete heart block ([place]) 03/14/2016  . Depression   . Diabetes mellitus type 2 in nonobese (HCC)   . Diabetes mellitus without complication ([place])   . Dyslipidemia 03/15/2016  . Essential hypertension 05/18/2009   Qualifier: Diagnosis of  By: Burnett Kanaris    . Gastroesophageal reflux disease without esophagitis 07/31/2017  . Gout, unspecified   . Headache   . History of CVA (cerebrovascular accident)   . History of pulmonary embolus (PE) 02/14/2017  . HTN (hypertension)   . Hypercholesterolemia   . Hyperglycemia   . Leukopenia 05/06/2018  . Migraines   .  Myocardial infarction [facility]) 2008   drug cocaine and marijuana at time of mi none  since    . Normocytic anemia 05/06/2018  . Pneumonia   . Polysubstance abuse (HCC) 01/15/2013  . Rectal bleeding   . Renal insufficiency   . Seizure (HCC) 05/06/2018  . Seizures (HCC)   . Stroke (HCC)   . Vertigo      Family History  Problem Relation Age of Onset  . Heart attack Mother        x7. Still alive  . Heart attack Father        deceased b/c of MI  . Heart attack Brother        older, deceased b/c of MI   . Cancer Brother        bone cancer in 1 brother, ?  brain cancer in other brother  . Heart attack Brother   . Other Brother        MVA  . Other Brother   . Bone cancer Brother   . Cancer Brother   . SIDS Sister      Social History:  reports that he has never smoked. He has never used smokeless tobacco. He reports previous alcohol use. He reports previous drug use. Drugs: "Crack" cocaine, Marijuana, and Cocaine.   Exam: Current vital signs: BP (!) 162/87   Pulse (!) 103   Temp 99.6 F (37.6 C) (Axillary)   Resp (!) 31   Ht 5\' 11"  (1.803 m)   Wt 97.7 kg   SpO2 99%   BMI 30.04 kg/m  Vital signs in last 24 hours: Temp:  [97.9 F (36.6 C)-99.7 F (37.6 C)] 99.6 F (37.6 C) (01/10 2000) Pulse Rate:  [86-120] 103 (01/10 1944) Resp:  [22-44] 31 (01/10 1944) BP: (102-181)/(63-87) 162/87 (01/10 1800) SpO2:  [87 %-100 %] 99 % (01/10 1944) FiO2 (%):  [80 %-90 %] 80 % (01/10 1944) Weight:  [97.7 kg] 97.7 kg (01/10 1523)   Physical Exam  Constitutional: Appears critically ill Psych: Does not respond Eyes: No scleral injection HENT: ET tube in place, neck is stiff MSK: no joint deformities.  Cardiovascular: Normal rate and regular rhythm.  Respiratory: Effort normal, non-labored breathing GI: Soft.  No distension. There is no tenderness.  Skin: WDI  Neuro: Mental Status: Patient is comatose, he does not open eyes or follow commands. Cranial Nerves: II: He does not blink to threat pupils are equal, round, and reactive to light.   III,IV, VI: Eyes are disconjugate, difficult to test doll's eye maneuver due to neck stiffness V: VII: Corneals are intact X: Cough intact Motor: He has no response to noxious stimulation in any extremity  sensory: He does not respond as above  deep Tendon Reflexes: 2+ and symmetric in the patellae.  Plantars: Toes are downgoing bilaterally.  Cerebellar: Does not perform   I have reviewed labs in epic and the results pertinent to this consultation are: VPA-persistently  subtherapeutic  I have reviewed the images obtained:   Impression: 64 year old male with persistent coma since his arrest on 1/1.  His arrest was relatively brief, but he had been persistently hypoxic both before and after the arrest.  Therefore I think hypoxic brain injury is a concern.  An MRI could be very helpful in determining this.  His neck stiffness is slightly concerning, and of unclear significance.  Given the duration of his encephalopathy, I do not feel that an LP is an emergent procedure at this time, but if MRI does  not give further answers, then I do feel that lumbar puncture would need to be considered.  The persistently low Depakote levels may be due to the fact that valproic acid is rapidly metabolized and Depakote is a slow release version.  I will change it from valproic acid to Depakote sprinkles.  Recommendations: 1) I will change from valproic acid to Depakote sprinkles 2) continue lacosamide 100 twice daily 3) MRI brain with and without contrast 4) further testing pending results of the MRI brain.  This patient is critically ill and at significant risk of neurological worsening, death and care requires constant monitoring of vital signs, hemodynamics,respiratory and cardiac monitoring, neurological assessment, discussion with family, other specialists and medical decision making of high complexity. I spent 45 minutes of neurocritical care time  in the care of  this patient. This was time spent independent of any time provided by nurse practitioner or PA.  Ritta Slot, MD [facility] [phone]  If 7pm- 7am, please page neurology on call as listed in AMION. 11/03/19  12:30 AM

## 2019-11-23 NOTE — Anesthesia Procedure Notes (Signed)
Procedure Name: Intubation Date/Time: 11/23/2019 10:40 PM Performed by: Zollie Scale, CRNA Pre-anesthesia Checklist: Patient identified, Emergency Drugs available, Suction available and Patient being monitored Patient Re-evaluated:Patient Re-evaluated prior to induction Oxygen Delivery Method: Ambu bag Preoxygenation: Pre-oxygenation with 100% oxygen Laryngoscope Size: Glidescope and 3 Grade View: Grade I Tube type: Oral Tube size: 7.5 mm Number of attempts: 1 Airway Equipment and Method: Stylet Placement Confirmation: ETT inserted through vocal cords under direct vision,  breath sounds checked- equal and bilateral and CO2 detector Secured at: 23 cm Tube secured with: Tape Dental Injury: Teeth and Oropharynx as per pre-operative assessment  Comments: Arrived at [place], RT present at bedside, attempted DL, unable to intubate, this CRNA utilized Glidescope for intubation during pulse check, G1 view, easy passage of ETT, +ETCO2, bilateral chest rise, ETT taped by RT.

## 2019-11-23 NOTE — Progress Notes (Signed)
ANTICOAGULATION CONSULT NOTE - Follow Up Consult  Pharmacy Consult for Heparin Indication: pulmonary embolus  Patient Measurements: Height: 5\' 11"  (180.3 cm) Weight: 226 lb 13.7 oz (102.9 kg) IBW/kg (Calculated) : 75.3 Heparin Dosing Weight: 96 kg   Vital Signs: Temp: 99.6 F (37.6 C) (01/10 1200) Temp Source: Oral (01/10 1200) BP: 169/81 (01/10 1202) Pulse Rate: 120 (01/10 1202)  Labs: Recent Labs    10/30/19 0445 10/30/19 1424 10/30/19 1425 10/30/19 2033 10/30/19 2314 10/31/19 0403 10/31/19 1244 November 19, 2019 0015 11-19-2019 0334 19-Nov-2019 1015  HGB  --    < > 8.4*   < > 9.2* 7.9*  --   --  8.0*  --   HCT  --    < > 28.2*  --  27.0* 27.3*  --   --  26.9*  --   PLT  --   --  175  --   --  200  --   --  213  --   LABPROT 16.4*  --   --   --   --   --   --   --   --   --   INR 1.3*  --   --   --   --   --   --   --   --   --   HEPARINUNFRC  --   --   --   --   --  0.77* 1.20* 0.50  --  0.35  CREATININE  --   --   --   --   --  1.51*  --   --  1.35*  --    < > = values in this interval not displayed.    Estimated Creatinine Clearance: 67.5 mL/min (A) (by C-G formula based on SCr of 1.35 mg/dL (H)).   Assessment: 25 YOM admitted to the ICU and intubated for COVID-19 pneumonia on apixaban for h/o PE. Given worsening renal fx, pharmacy was consulted to transition from apixaban to IV heparin.  Heparin level this AM remains therapeutic at 0.35. H/H low stable, Plt wnl.   Goal of Therapy:  Heparin level 0.3-0.7 units/ml Monitor platelets by anticoagulation protocol: Yes   Plan:  Continue heparin at 1200 units/hr  Monitor daily HL and CBC   76, PharmD., BCPS Clinical Pharmacist Clinical phone for 11/19/19 until 5pm: [phone]

## 2019-11-23 NOTE — Progress Notes (Signed)
NAME:  Russell Carter, MRN:  [id number], DOB:  08/06/1956, LOS: 58 ADMISSION DATE:  09/26/2019, CONSULTATION DATE:  1/1 REFERRING MD:  Thereasa Solo, CHIEF COMPLAINT:  Dyspnea   Brief History   64 y/o male treated for COVID pneumonia in early December, re-admitted 12/11 in setting of worsening respiratory failure with hypoxemia.  Treated again with remdesivir and steroids.  Also treated for UTI/pneumonia.  Intubated on 1/1 in setting of worsening hypoxemia, had a PEA arrest.    Past Medical History  CAD HTN PE DM2 Seizure disorder Bipolar Polysubstance OA Gout CVA Migraine GERD   Significant Hospital Events   Treated for COVID-19 pneumonia 12/4-12/8 [name], discharged on room air 12/11 readmit via [facility], treated again with remdesivir and Decadron 12/14 evolving encephalopathy 12/16 unwitnessed fall, subsequent CT head unrevealing 12/23 psych consult, Ativan, Cogentin, Haldol recommended 12/31 more sedate, less interactive, some evolving respiratory distress, fevers 1/1 continued encephalopathy, fever, increased work of breathing 1/1 intubation, brief PEA arrest 1/9 brief episode of bradycardia, HR 20s, possible vagal event, also on Precedex, Precedex stopped  Consults:    Procedures:  1/1 ETT >   Significant Diagnostic Tests:  Head CT 12/16 >> no acute abnormality Head CT 12/22 >> no change, no acute abnormality KUB 12/29 >> feeding tube placed appropriately in the stomach CT ABd: No ischemic bowel MRI Brain >> pending  EEG >> pending   Micro Data:  MRSA screen 1/1 >> negative Urine 12/26 >> negative Urine 12/27 >> negative Urine 1/1 >> negative Blood 1/1 >> NGTD Respiratory 1/2 >> few GPC >> normal flora Blood 1/6 >> NGTD Urine 1/6 >> NGTD Respiratory 1/6 >> normal flora   Antimicrobials:  Remdesivir course #2 12/12 >> 12/16 Dexamethasone course #2 12/11 >> 12/19 Ceftriaxone 12/25 >> 12/19 Cefepime 1/1 >> 1/2 Zosyn 1/2 >> 1/8   Interim  history/subjective:   No issues overnight.  Patient remains intubated on mechanical life support critically ill.  Mental status unchanged.  Still sedated on mechanical support.   Objective   Blood pressure 117/66, pulse 96, temperature 97.9 F (36.6 C), temperature source Axillary, resp. rate (!) 30, height 5\' 11"  (1.803 m), weight 102.9 kg, SpO2 95 %. CVP:  [0 mmHg-26 mmHg] 8 mmHg  Vent Mode: PRVC FiO2 (%):  [40 %-100 %] 90 % Set Rate:  [24 bmp] 24 bmp Vt Set:  [520 mL] 520 mL PEEP:  [8 cmH20-10 cmH20] 8 cmH20 Plateau Pressure:  [29 cmH20-44 cmH20] 41 cmH20   Intake/Output Summary (Last 24 hours) at 10/28/2019 0730 Last data filed at 10/28/2019 0700 Gross per 24 hour  Intake 3279.66 ml  Output 1540 ml  Net 1739.66 ml   Filed Weights   10/03/19 1642 10/10/19 0351  Weight: 95.3 kg 102.9 kg    Examination:  General: Elderly male, intubated critically ill mechanical life support HENT: NCAT, endotracheal tube in place PULM: Bilateral ventilated breath sounds CV: Regular rate rhythm, S1-S2 GI: Bowel sounds present, soft nontender MSK: Normal bulk and tone Neuro: Sedated on mechanical ventilation, no nystagmus minimal response to pain no response to pain   Resolved Hospital Problem list    Assessment & Plan:   ARDS due to aspiration pneumonia, recent COVID pneumonia No proning today  Oxygenation was improving.  Now little bit worse this morning.  FiO2 requirements did go up in the middle of the night. Currently working with staff to wean again today.  Septic shock, resolved Already completed course of Zosyn Continue to observe off antimicrobials  Lactic acid elevation, unclear cause as not in shock CT ABD Pelvis no evidence of ischemic bowel Suspect related to liver dysfunction Continue to observe Follow liver function tests, these are also improving  AKI  Continue to follow urine output and serum creatinine Kidney function is improving  Hypernatermia Remains on  free water replacement, now resolved  DM2 Continue CBGs with SSI  Seizure disorder Continue Depakote plus Vimpat EEG pending  Acute 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 PEA Cardiac Arrest on 1/1 RASS goal 0 to -1 Currently on fentanyl Low-dose propofol Would like to turn propofol off if possible during EEG. Discussed with nursing staff. Transfer pending to [facility] for MRI.  History of PE Continue heparin drip Appreciate pharmacy consultation.   Best practice:  Diet: tube feeding Pain/Anxiety/Delirium protocol (if indicated): as above VAP protocol (if indicated): yes DVT prophylaxis: hep drip GI prophylaxis: famot Glucose control: ssi Mobility: bed rest Code Status: full Family Communication: I called number available in the chart.  There was no answer.  I will try to reach out again later today. Disposition: remain in ICU  Labs   CBC: Recent Labs  Lab 10/28/19 0500 10/29/19 0422 10/30/19 1425 10/30/19 2314 10/31/19 0403 11/20/2019 0334  WBC 8.2 4.6 5.7  --  7.1 7.5  NEUTROABS  --   --   --   --  4.1  --   HGB 8.8* 8.2* 8.4* 9.2* 7.9* 8.0*  HCT 31.0* 28.7* 28.2* 27.0* 27.3* 26.9*  MCV 98.7 96.3 96.2  --  95.1 96.1  PLT 193 156 175  --  200 213    Basic Metabolic Panel: Recent Labs  Lab 10/27/19 0400 10/27/19 2230 10/28/19 0500 10/28/19 1617 10/29/19 0422 10/30/19 2314 10/31/19 0403  NA 143 145 144 147* 145 150* 148*  K 5.3* 5.9* 5.6* 4.5 4.0 4.0 4.1  CL 108 111 111 113* 111  --  108  CO2 28 21* 25 25 24   --  29  GLUCOSE 140* 210* 130* 162* 144*  --  145*  BUN 27* 37* 41* 43* 42*  --  52*  CREATININE 1.30* 2.11* 1.84* 1.71* 1.48*  --  1.51*  CALCIUM 7.4* 6.2* 6.6* 6.4* 6.8*  --  7.2*  MG 2.8*  --  2.8*  --  2.8*  --   --    GFR: Estimated Creatinine Clearance: 60.3 mL/min (A) (by C-G formula based on SCr of 1.51 mg/dL (H)). Recent Labs  Lab 10/28/19 0835 10/29/19 0422 10/29/19 0423 10/30/19 1422 10/30/19 1425  10/30/19 1925 10/31/19 0403 11/03/2019 0334  WBC  --  4.6  --   --  5.7  --  7.1 7.5  LATICACIDVEN 2.4*  --  3.2* 4.1*  --  3.2*  --   --     Liver Function Tests: Recent Labs  Lab 10/29/19 0422 10/30/19 0445 10/31/19 0403  AST 2,630* 2,745* 2,634*  ALT 1,448* 1,931* 2,120*  ALKPHOS 55 70 73  BILITOT 0.4 0.3 0.8  PROT 5.3* 5.6* 5.7*  ALBUMIN 1.3* 1.2* 1.4*   No results for input(s): LIPASE, AMYLASE in the last 168 hours. Recent Labs  Lab 10/29/19 0423 10/31/19 1450  AMMONIA 36* 38*    ABG    Component Value Date/Time   PHART 7.423 10/30/2019 2314   PCO2ART 40.5 10/30/2019 2314   PO2ART 135.0 (H) 10/30/2019 2314   HCO3 26.4 10/30/2019 2314   TCO2 28 10/30/2019 2314   ACIDBASEDEF 3.0 (H) 10/27/2019 2233   O2SAT  99.0 10/30/2019 2314     Coagulation Profile: Recent Labs  Lab 10/30/19 0445  INR 1.3*    Cardiac Enzymes: No results for input(s): CKTOTAL, CKMB, CKMBINDEX, TROPONINI in the last 168 hours.  HbA1C: HbA1c, POC (controlled diabetic range)  Date/Time Value Ref Range Status  06/06/2018 03:03 PM 5.7 0.0 - 7.0 % Final   Hgb A1c MFr Bld  Date/Time Value Ref Range Status  10/25/2019 04:19 AM 6.7 (H) 4.8 - 5.6 % Final    Comment:    (NOTE) Pre diabetes:          5.7%-6.4% Diabetes:              >6.4% Glycemic control for   <7.0% adults with diabetes   08/19/2019 05:41 PM 6.3 (H) 4.8 - 5.6 % Final    Comment:    (NOTE) Pre diabetes:          5.7%-6.4% Diabetes:              >6.4% Glycemic control for   <7.0% adults with diabetes     CBG: Recent Labs  Lab 10/31/19 1226 10/31/19 1632 10/31/19 2004 11/21/2019 0002 11/04/2019 0350  GLUCAP 145* 129* 118* 111* 125*     This patient is critically ill with multiple organ system failure; which, requires frequent high complexity decision making, assessment, support, evaluation, and titration of therapies. This was completed through the application of advanced monitoring technologies and extensive  interpretation of multiple databases. During this encounter critical care time was devoted to patient care services described in this note for 31 minutes.  Josephine Igo, DO [facility] 11/06/2019 7:30 AM

## 2019-11-23 DEATH — deceased

## 2020-01-11 IMAGING — DX PORTABLE CHEST - 1 VIEW
1 series · 1 of 1 positions shown · non-contrast
Comparison: 05/05/2018

CLINICAL DATA: Left side chest pain radiating to left arm.

EXAM:
PORTABLE CHEST 1 VIEW

[chest ap]
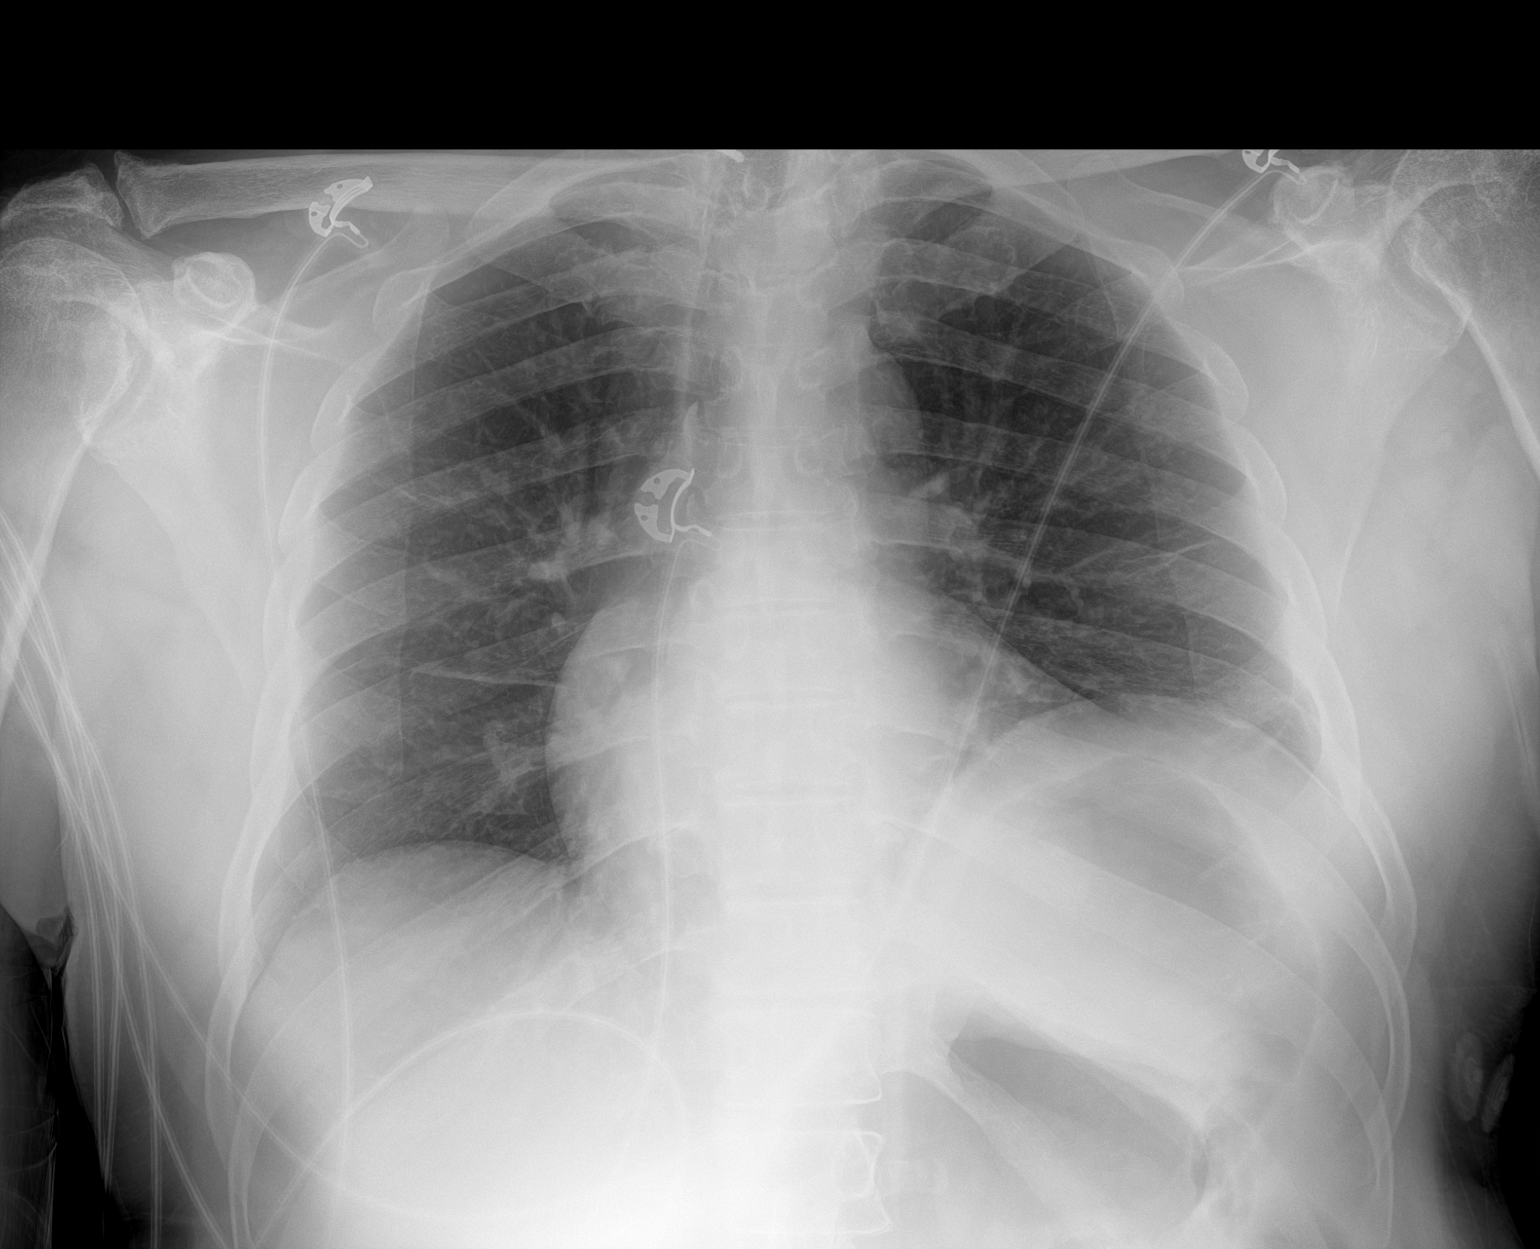

[1 of 1 positions shown; findings below may reference images not displayed]

FINDINGS: Mild elevation of the left hemidiaphragm. Heart is normal size.
Lungs clear. No effusions. No acute bony abnormality.
IMPRESSION: No active disease.

## 2020-01-15 IMAGING — CT CT HEAD WITHOUT CONTRAST
4 series · 16 of 47 positions shown, 18 images · non-contrast
Comparison: 12/25/2018

CLINICAL DATA: Syncope

EXAM:
CT HEAD WITHOUT CONTRAST
CT CERVICAL SPINE WITHOUT CONTRAST
TECHNIQUE: Multidetector CT imaging of the head and cervical spine was
performed following the standard protocol without intravenous
contrast. Multiplanar CT image reconstructions of the cervical spine
were also generated.

[Series 3: head wo · axial · 0.44mm/px · z∈[-146,-26]mm · 7 of 33 slices shown, 9 images]
[im 5/33  brain]
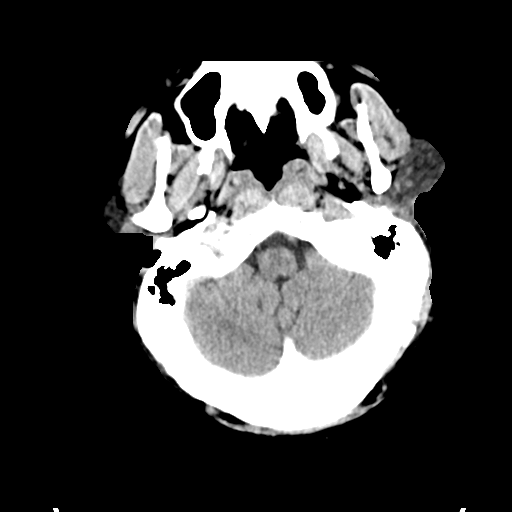
[im 5/33  bone]
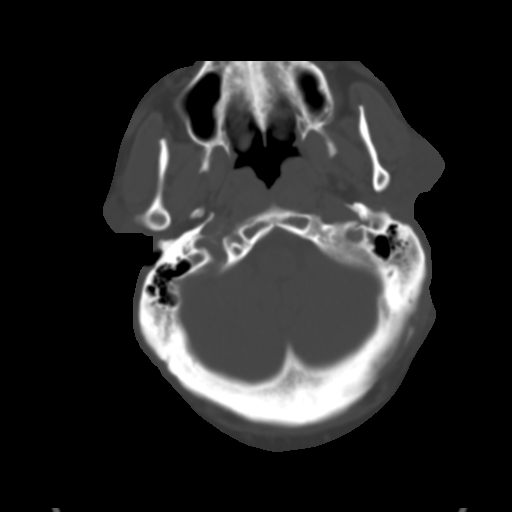
[im 9/33  brain]
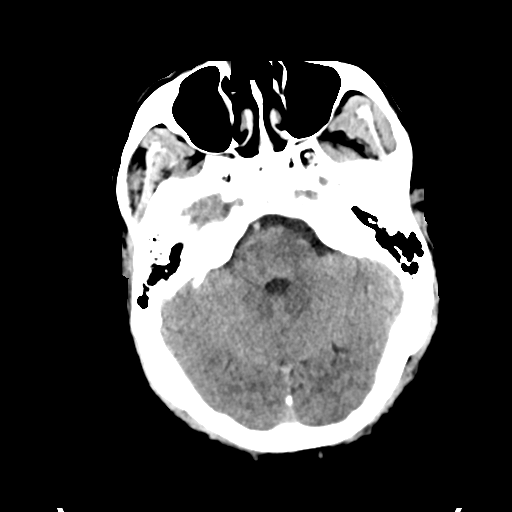
[im 13/33  brain]
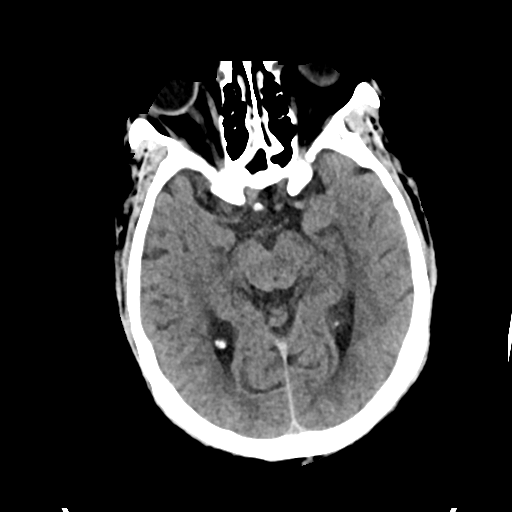
[im 17/33  brain]
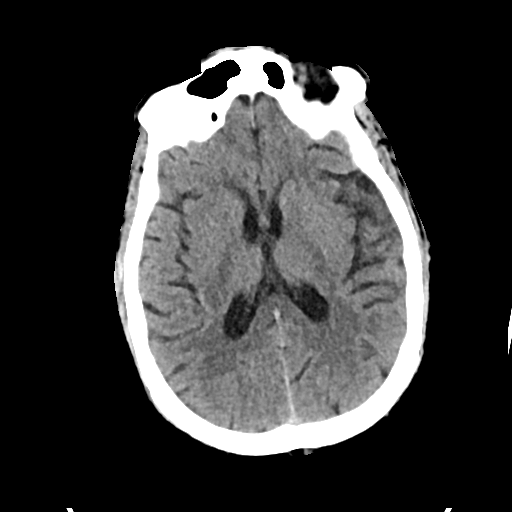
[im 21/33  brain]
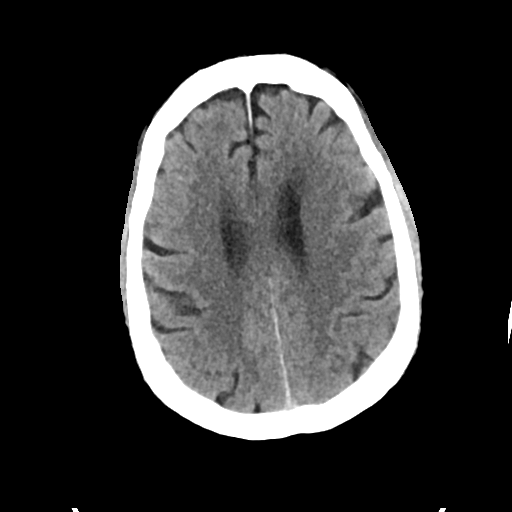
[im 21/33  bone]
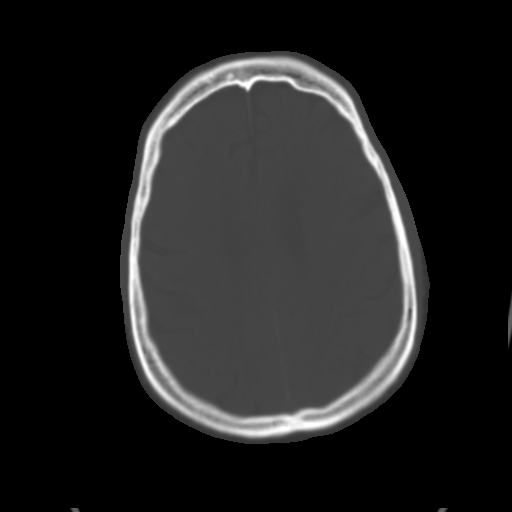
[im 25/33  brain]
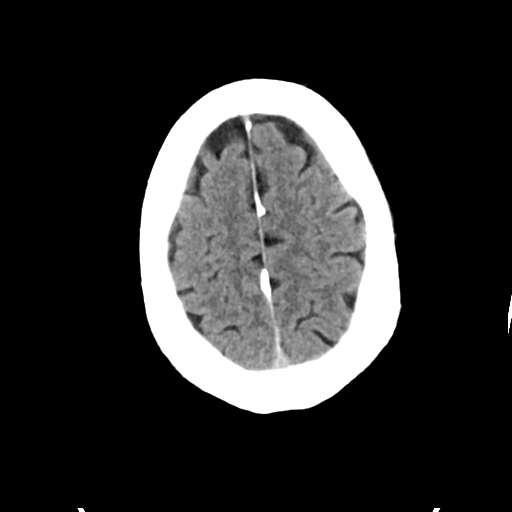
[im 29/33  brain]
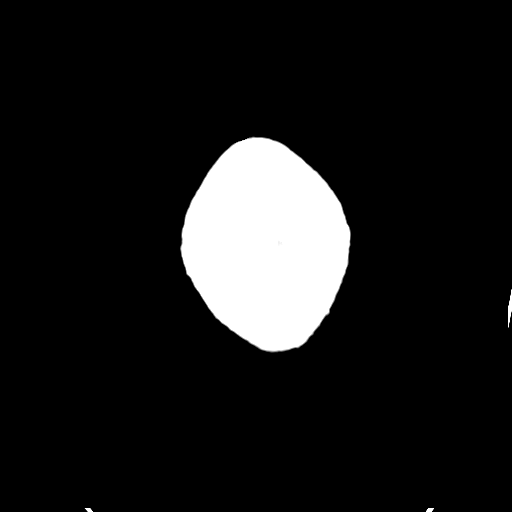

[Series 4: head bone · axial · 0.44mm/px · z∈[-150,-118]mm · 3 of 81 slices shown]
[im 9/81  bone]
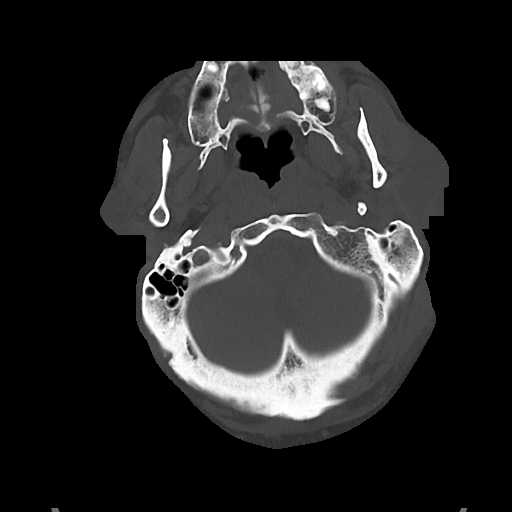
[im 17/81  bone]
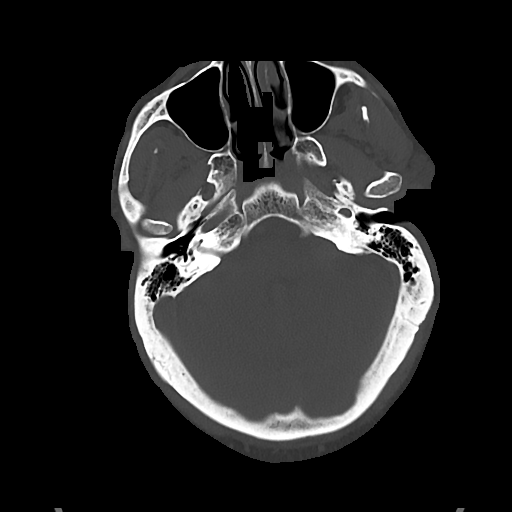
[im 25/81  bone]
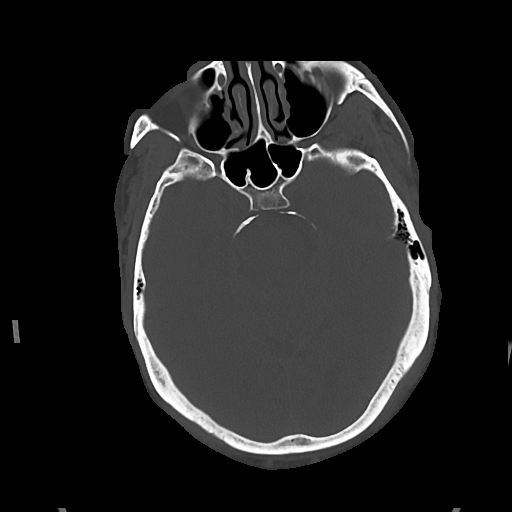

[Series 5: cor soft · coronal · 0.32mm/px · 3 of 67 slices shown]
[im 23/67  brain]
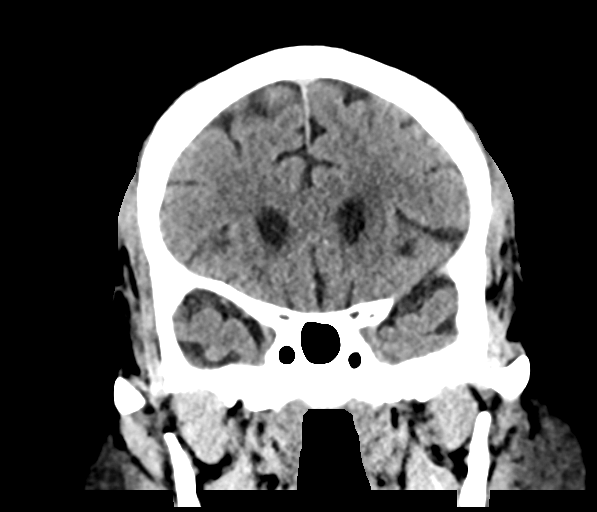
[im 30/67  brain]
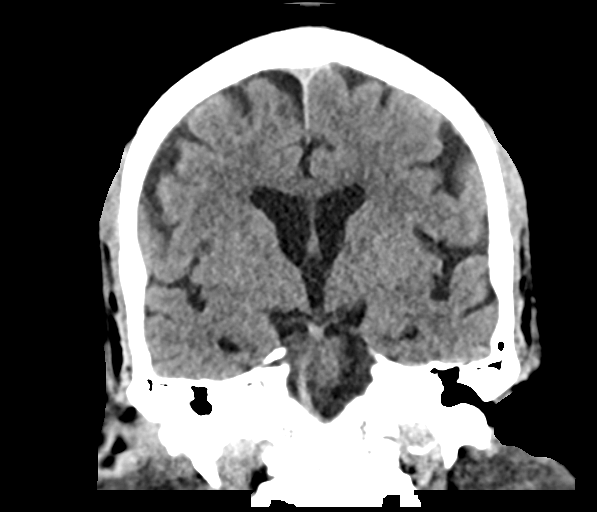
[im 37/67  brain]
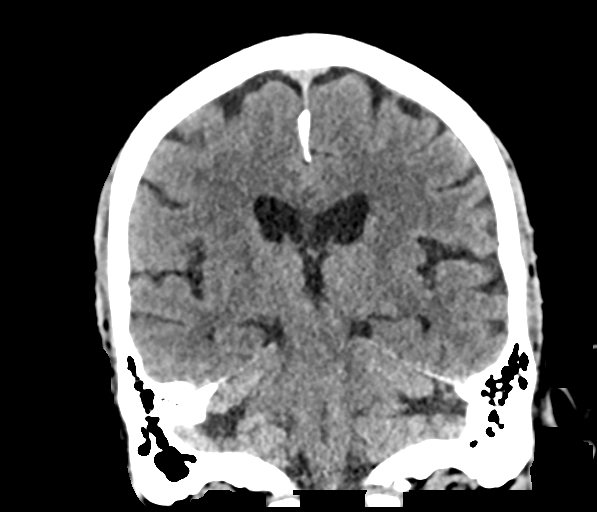

[Series 6: sag soft · sagittal · 0.32mm/px · 3 of 67 slices shown]
[im 23/67  brain]
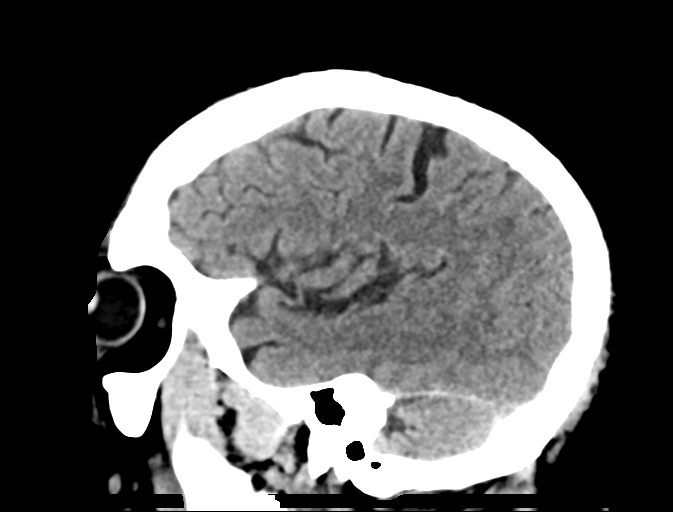
[im 34/67  brain]
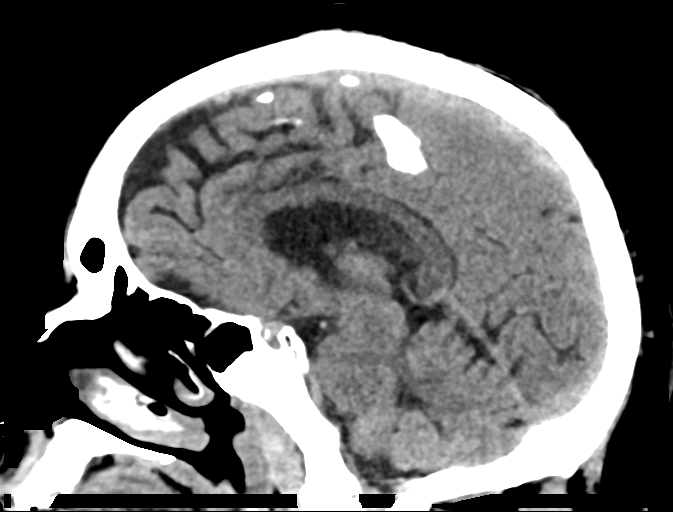
[im 45/67  brain]
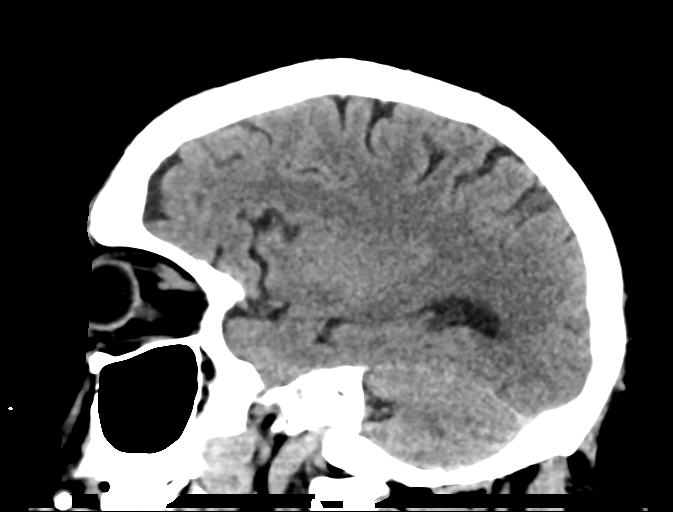

[16 of 47 positions shown; findings below may reference images not displayed]

FINDINGS: CT HEAD FINDINGS

Brain: No evidence of acute infarction, hemorrhage, hydrocephalus,
extra-axial collection or mass lesion/mass effect. Periventricular
white matter hypodensity.

Vascular: No hyperdense vessel or unexpected calcification.

Skull: Normal. Negative for fracture or focal lesion.

Sinuses/Orbits: No acute finding.

Other: None.

CT CERVICAL SPINE FINDINGS

Alignment: Normal.

Skull base and vertebrae: No acute fracture. No primary bone lesion
or focal pathologic process.

Soft tissues and spinal canal: No prevertebral fluid or swelling. No
visible canal hematoma.

Disc levels: Anterior cervical discectomy and fusion from C4 through
C7 with posterior laminectomy and fusion from C3 through T1.

Upper chest: Negative.

Other: None.
IMPRESSION: 1. No acute intracranial pathology. Small-vessel white matter
disease.

2. No fracture or static subluxation of the cervical spine. Anterior
cervical discectomy and fusion from C4 through C7 with posterior
laminectomy and fusion from C3 through T1.

## 2020-01-15 IMAGING — CT CT CERVICAL SPINE WITHOUT CONTRAST
3 of 4 series · 12 of 33 positions shown, 14 images · non-contrast
Comparison: 12/25/2018

CLINICAL DATA: Syncope

EXAM:
CT HEAD WITHOUT CONTRAST
CT CERVICAL SPINE WITHOUT CONTRAST
TECHNIQUE: Multidetector CT imaging of the head and cervical spine was
performed following the standard protocol without intravenous
contrast. Multiplanar CT image reconstructions of the cervical spine
were also generated.

[Series 6: sag bone · sagittal · 0.25mm/px · 5 of 61 slices shown, 6 images]
[im 21/61  bone]
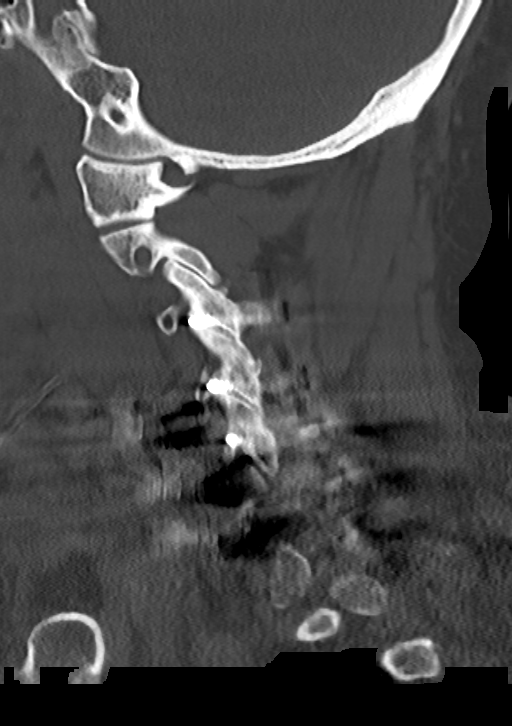
[im 26/61  bone]
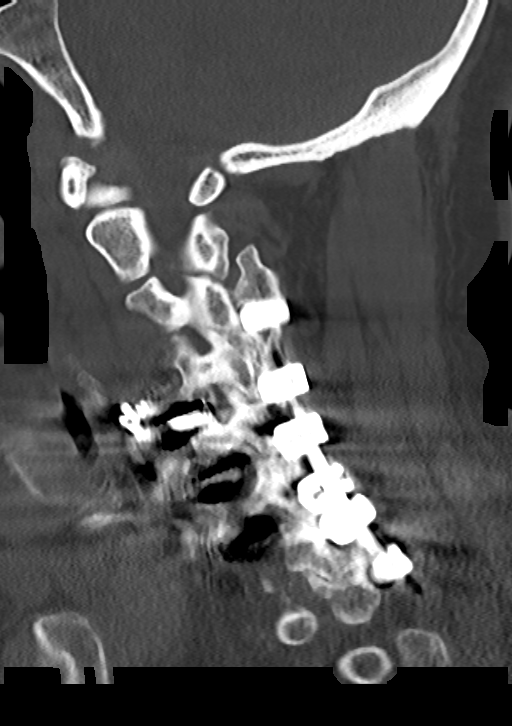
[im 31/61  soft-tissue]
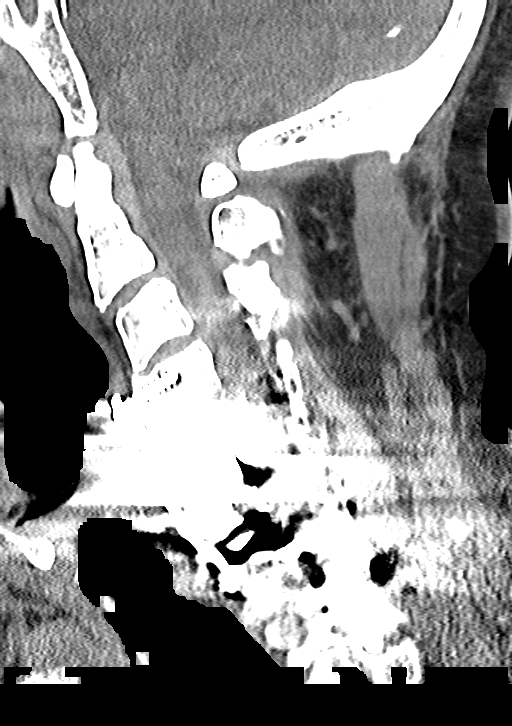
[im 31/61  bone]
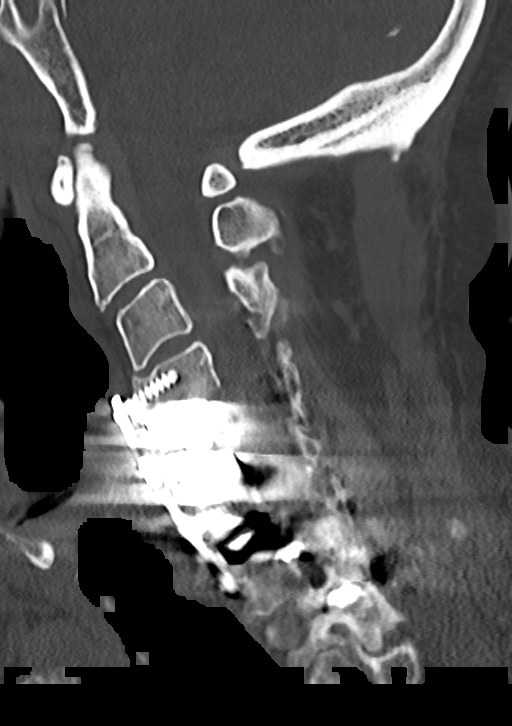
[im 36/61  bone]
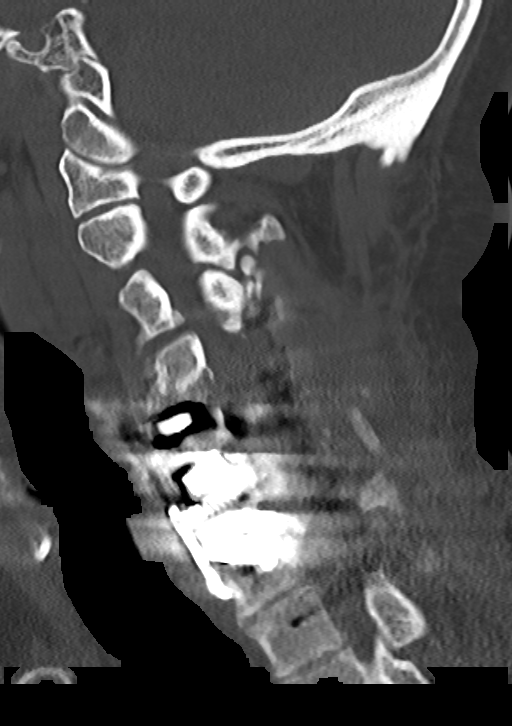
[im 41/61  bone]
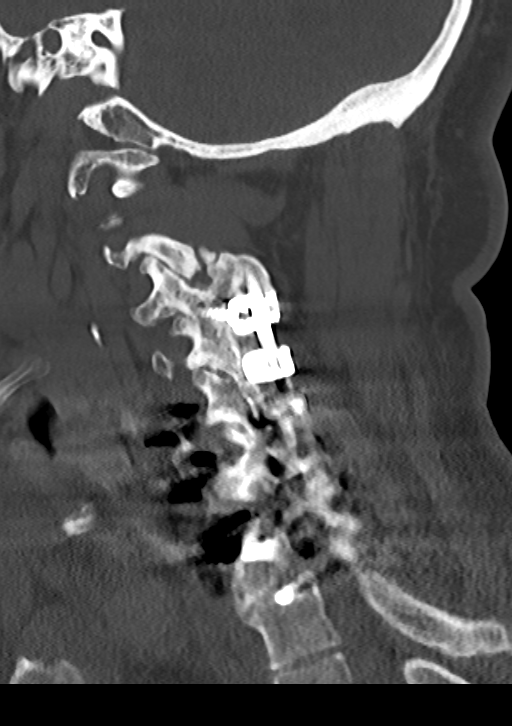

[Series 7: cor bone · coronal · 0.25mm/px · 3 of 61 slices shown]
[im 13/61  bone]
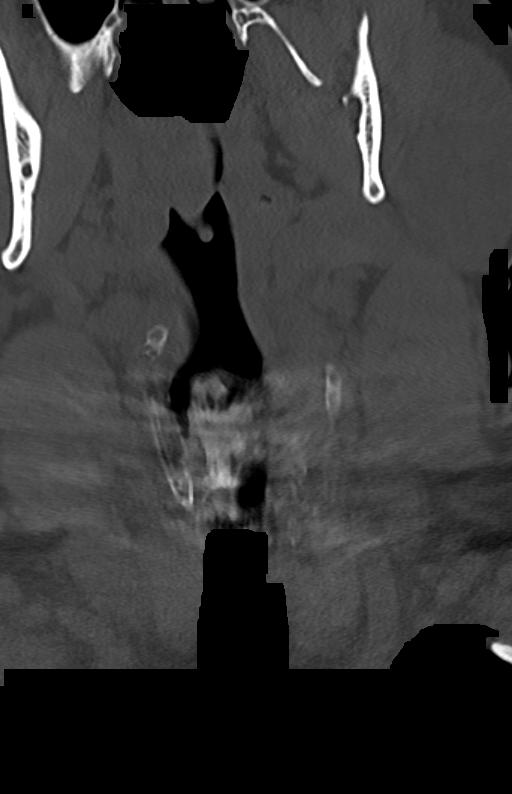
[im 25/61  bone]
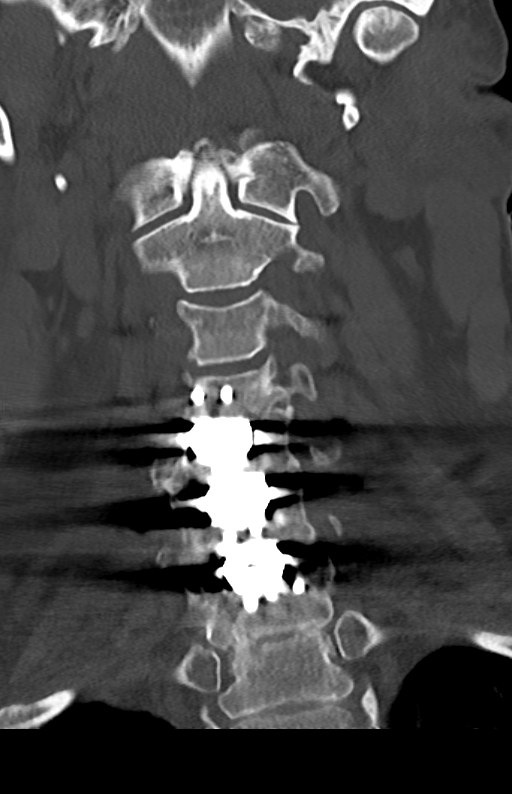
[im 37/61  bone]
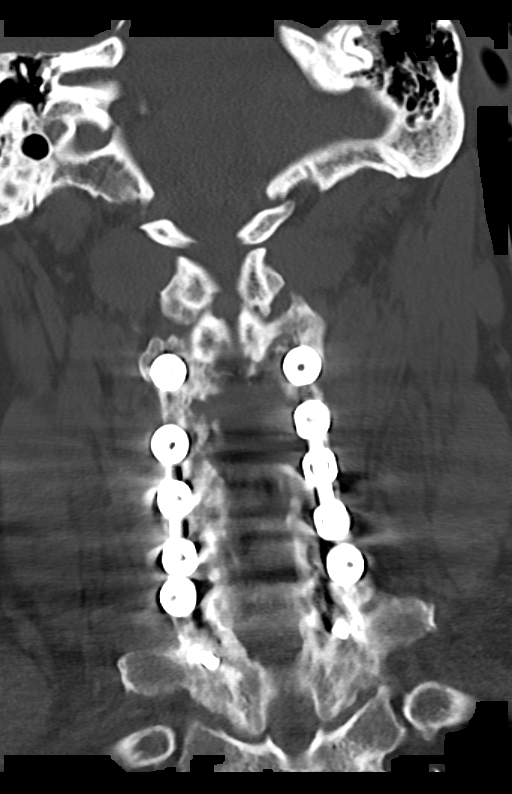

[Series 8: orthogonal axials · axial · 0.21mm/px · z∈[-283,-186]mm · 4 of 85 slices shown, 5 images]
[im 15/85  soft-tissue]
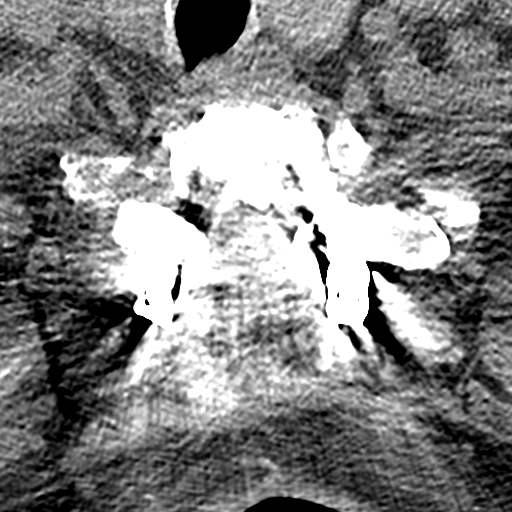
[im 15/85  bone]
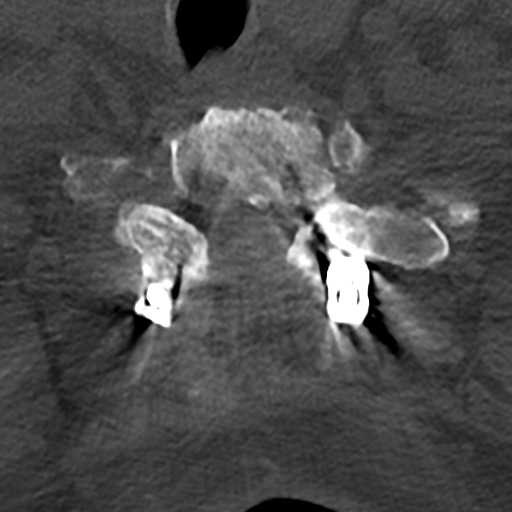
[im 29/85  bone]
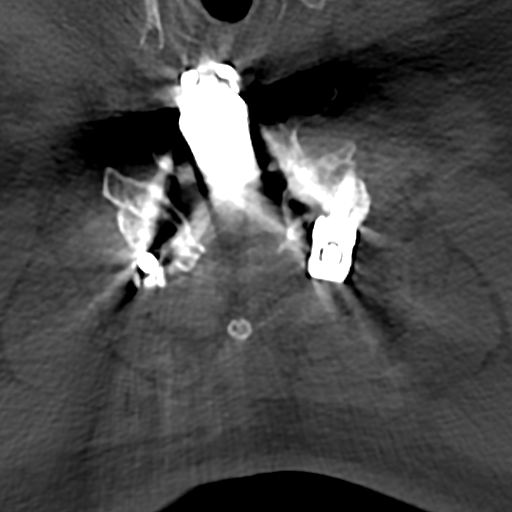
[im 57/85  bone]
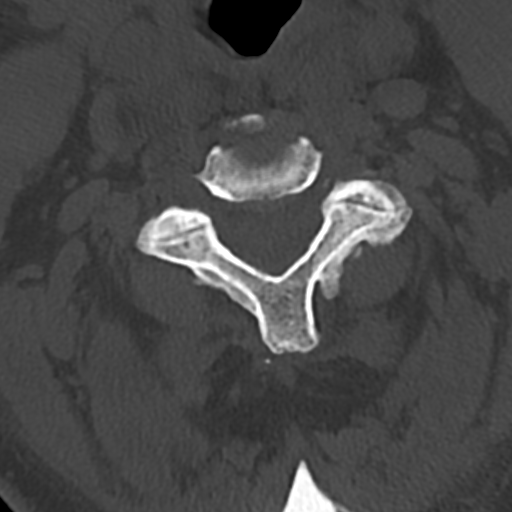
[im 71/85  bone]
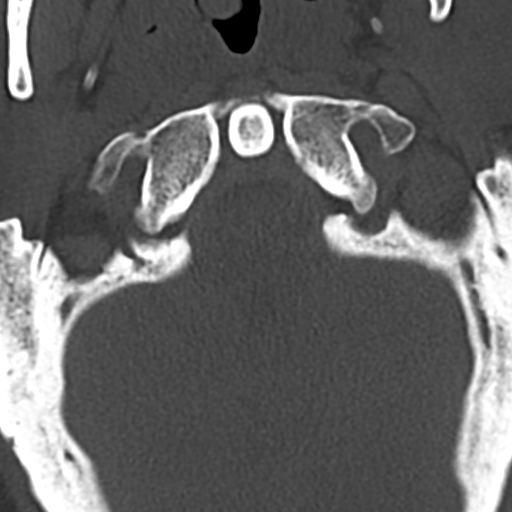

[12 of 33 positions shown; findings below may reference images not displayed]

FINDINGS: CT HEAD FINDINGS

Brain: No evidence of acute infarction, hemorrhage, hydrocephalus,
extra-axial collection or mass lesion/mass effect. Periventricular
white matter hypodensity.

Vascular: No hyperdense vessel or unexpected calcification.

Skull: Normal. Negative for fracture or focal lesion.

Sinuses/Orbits: No acute finding.

Other: None.

CT CERVICAL SPINE FINDINGS

Alignment: Normal.

Skull base and vertebrae: No acute fracture. No primary bone lesion
or focal pathologic process.

Soft tissues and spinal canal: No prevertebral fluid or swelling. No
visible canal hematoma.

Disc levels: Anterior cervical discectomy and fusion from C4 through
C7 with posterior laminectomy and fusion from C3 through T1.

Upper chest: Negative.

Other: None.
IMPRESSION: 1. No acute intracranial pathology. Small-vessel white matter
disease.

2. No fracture or static subluxation of the cervical spine. Anterior
cervical discectomy and fusion from C4 through C7 with posterior
laminectomy and fusion from C3 through T1.

## 2020-02-11 IMAGING — DX PORTABLE CHEST - 1 VIEW
1 series · 1 of 1 positions shown · non-contrast
Comparison: Chest radiograph dated 04/09/2019

CLINICAL DATA: 63-year-old male with cough

EXAM:
PORTABLE CHEST 1 VIEW

[chest]
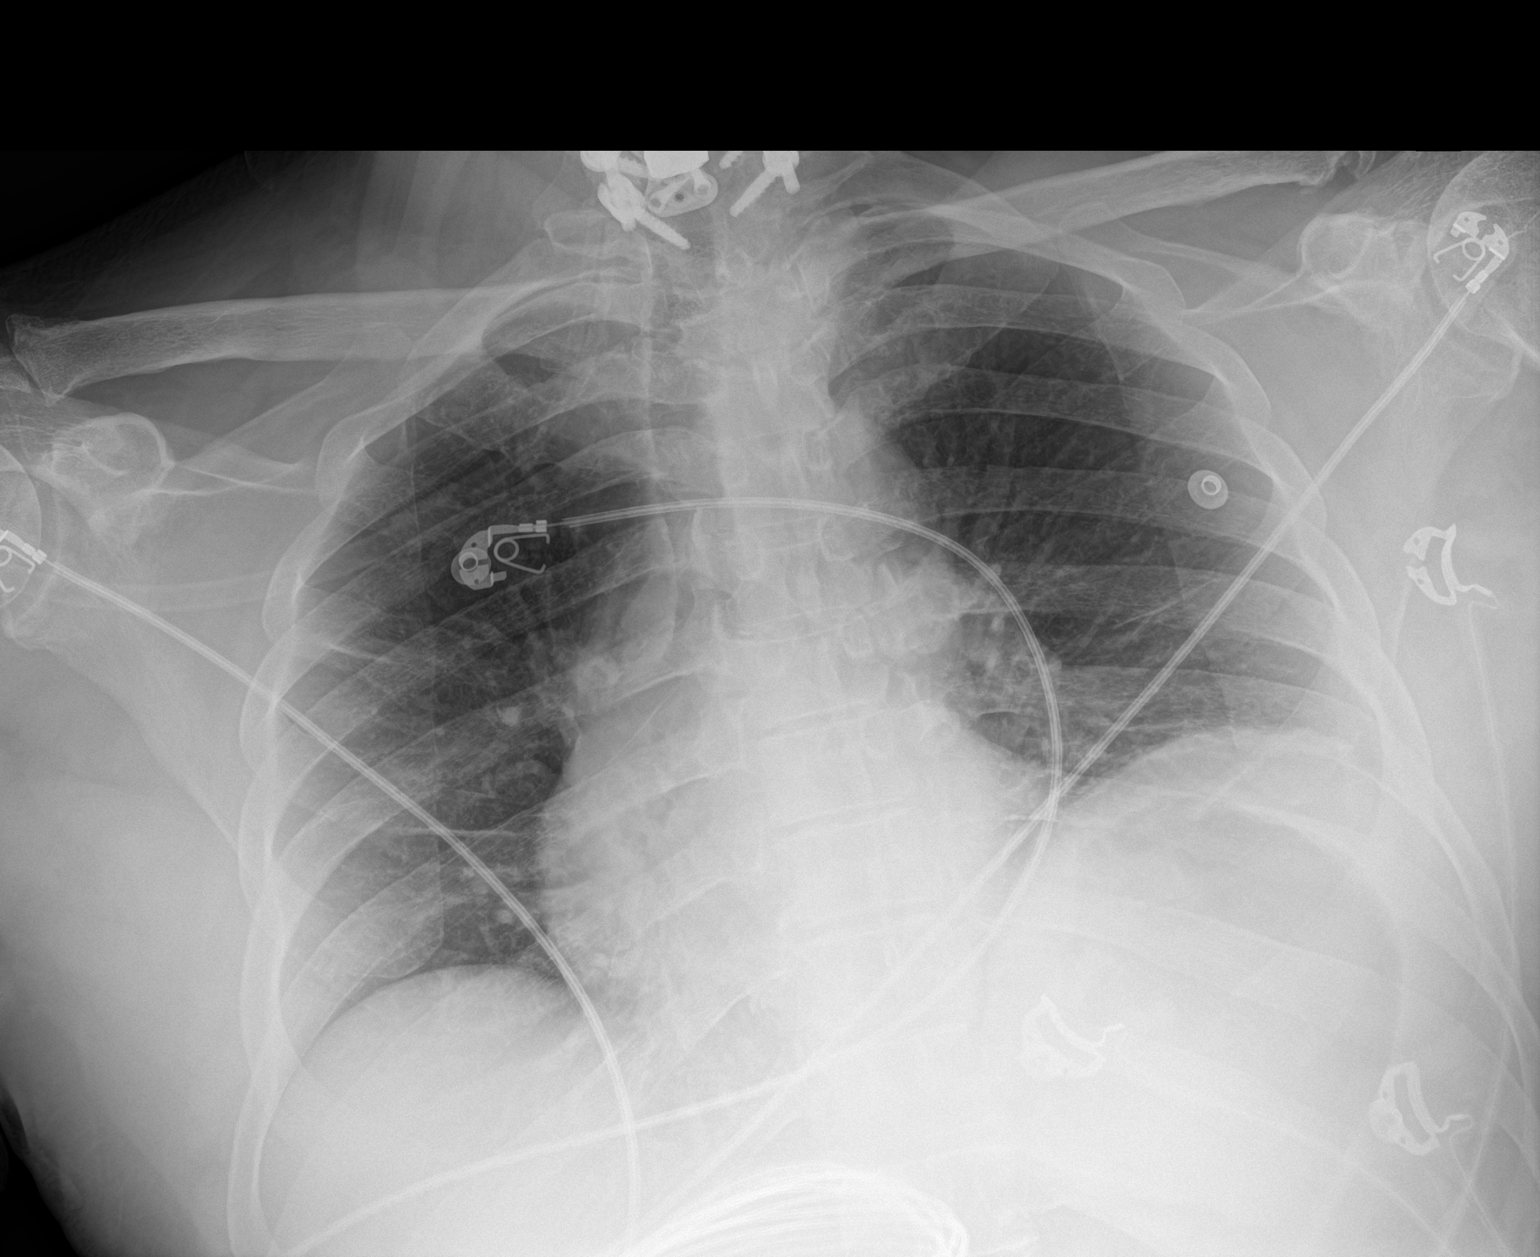

[1 of 1 positions shown; findings below may reference images not displayed]

FINDINGS: Shallow inspiration. There is mild eventration of the left
hemidiaphragm with associated subsegmental atelectasis. No focal
consolidation, pleural effusion, or pneumothorax. The cardiac
silhouette is within normal limits. No acute osseous pathology.
Extensive cervical spine fixation hardware.
IMPRESSION: No active disease.

## 2020-02-11 IMAGING — CR THORACIC SPINE 2 VIEWS
3 series · 3 of 3 positions shown · non-contrast
Comparison: None.

CLINICAL DATA: Back pain

EXAM:
THORACIC SPINE 2 VIEWS

[t-spine ap]
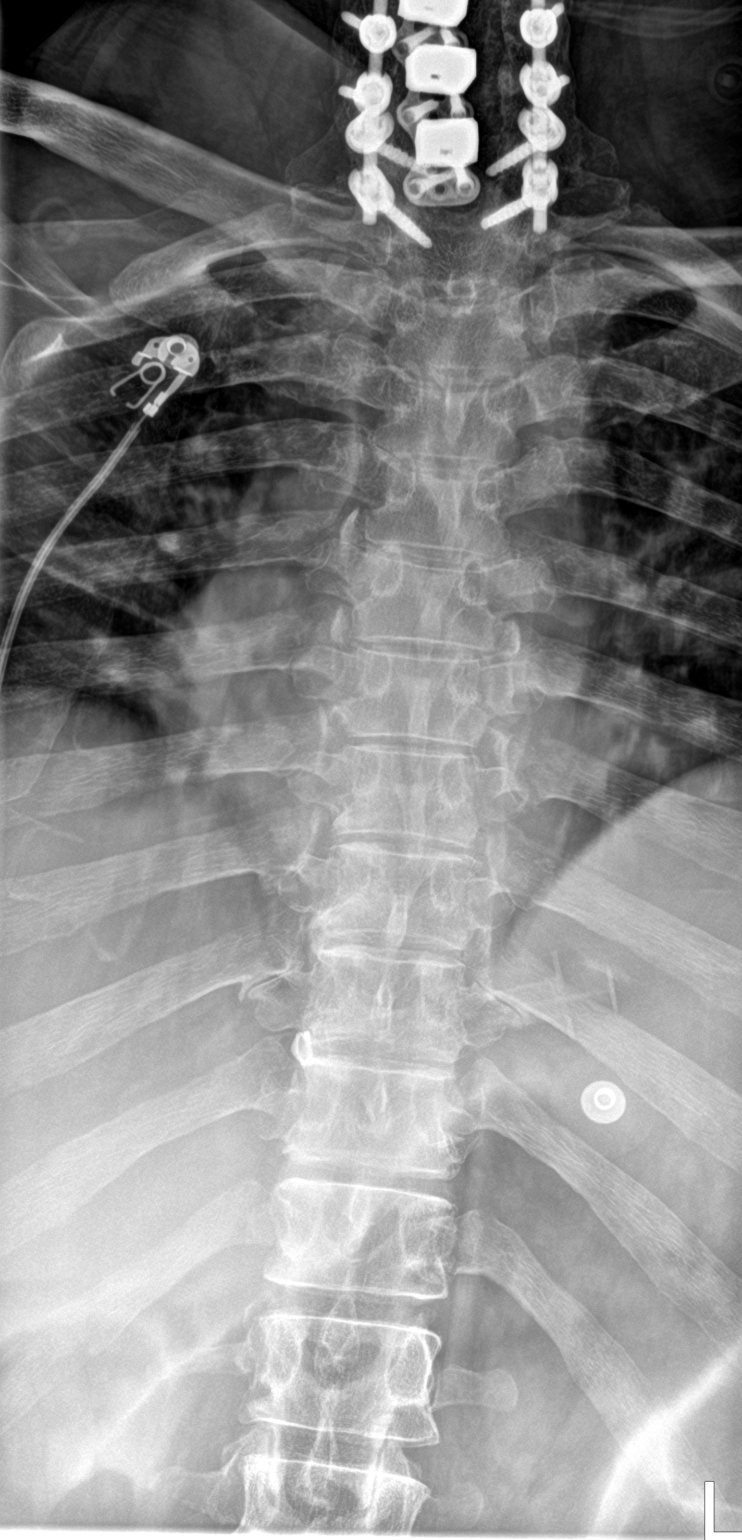

[t-spine lat]
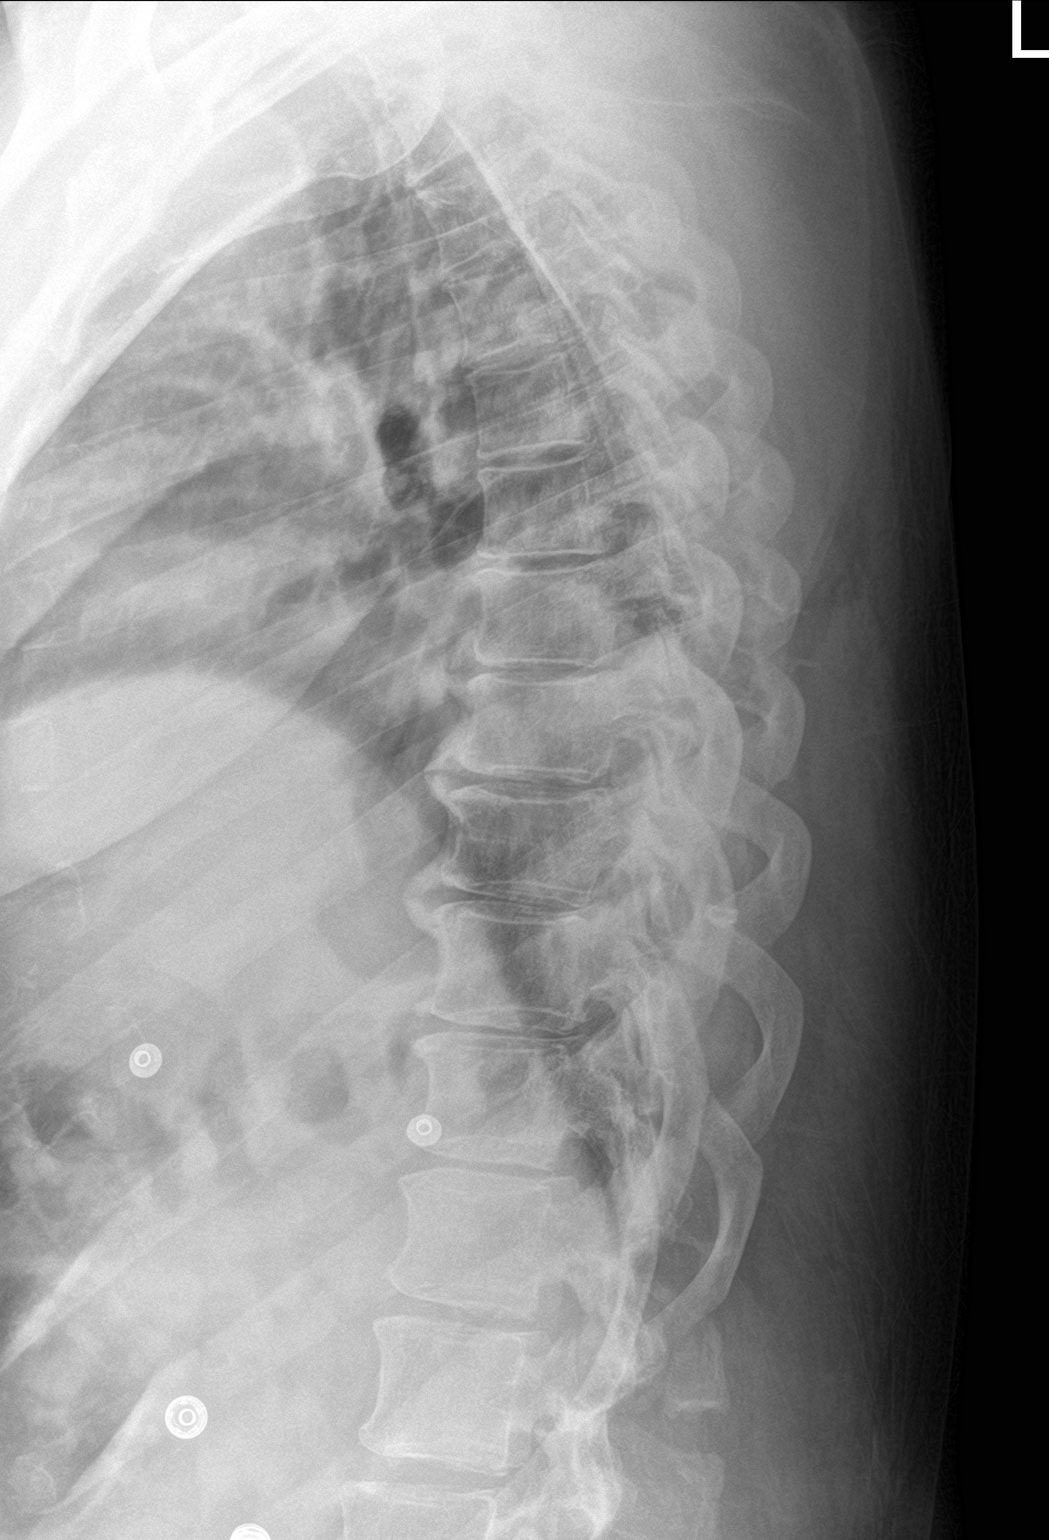

[t-spine swimmers]
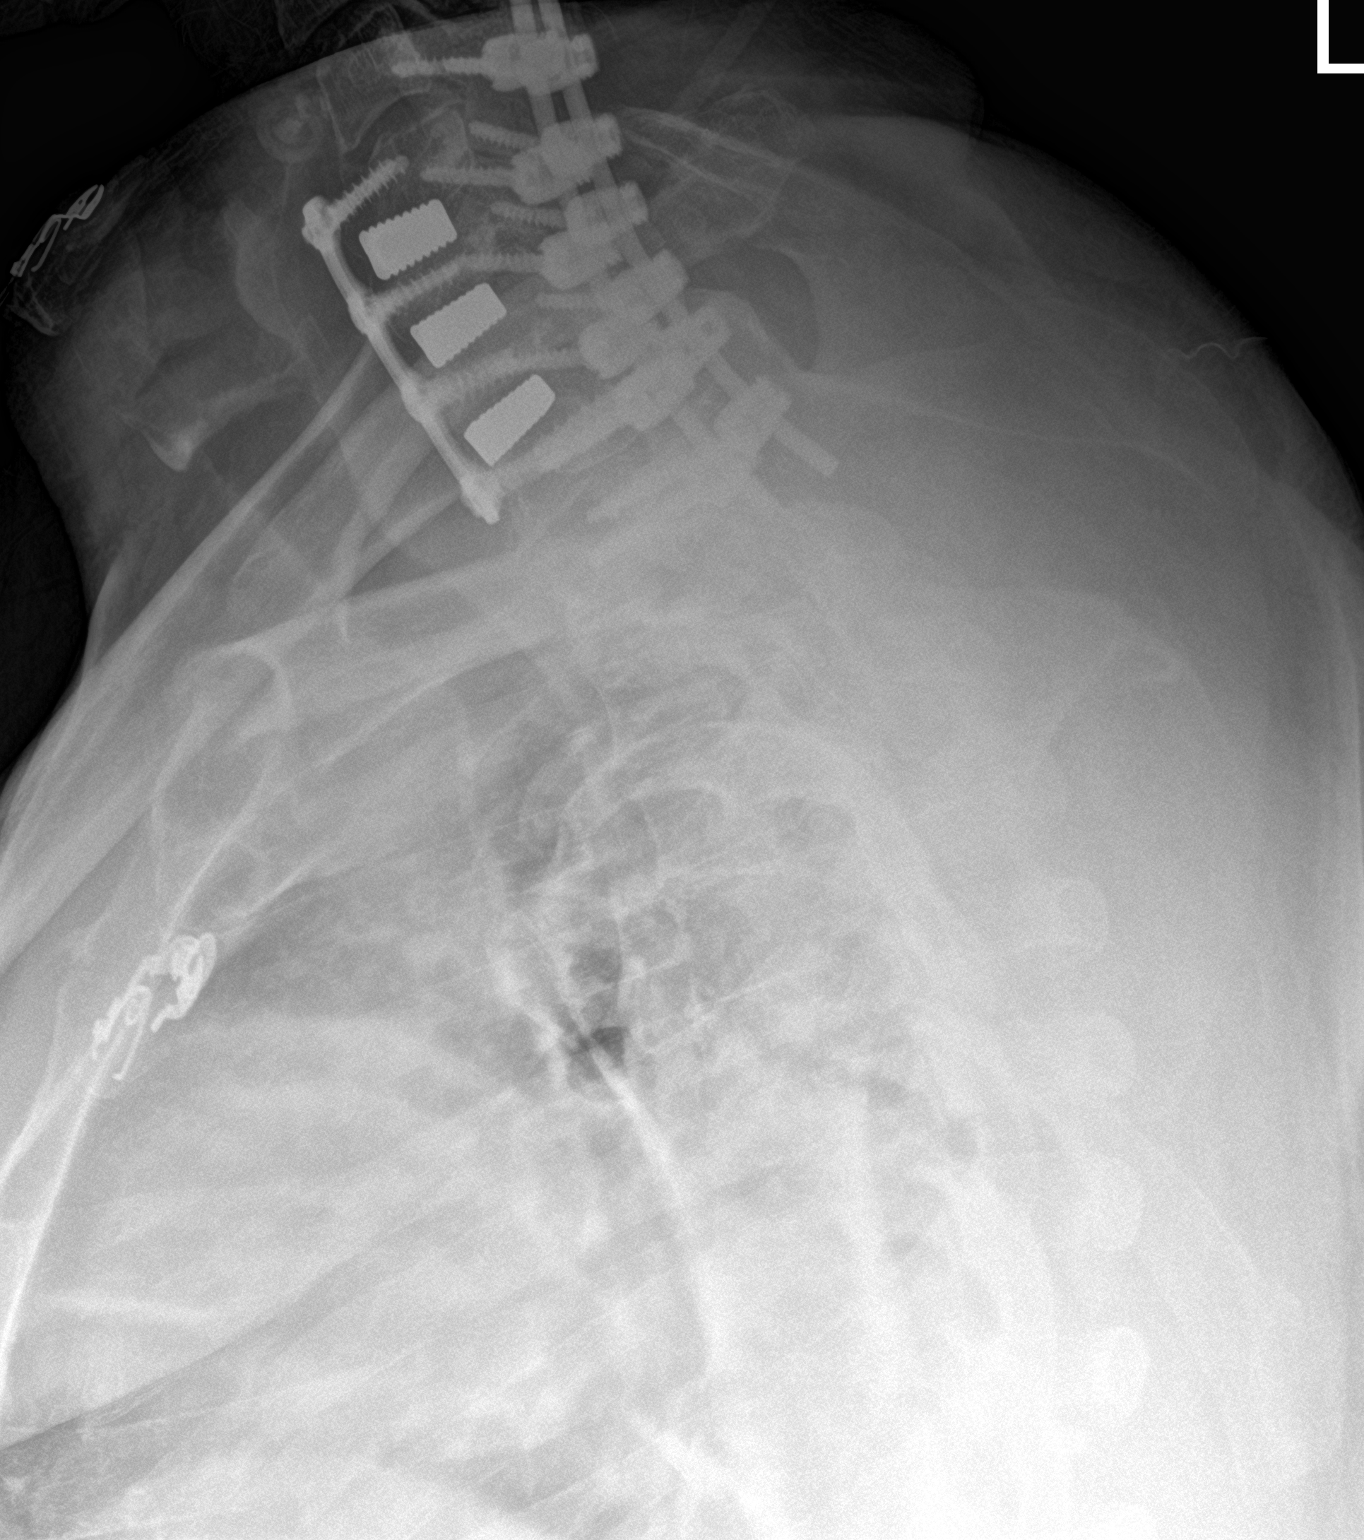

[3 of 3 positions shown; findings below may reference images not displayed]

FINDINGS: There is no evidence of thoracic spine fracture. Alignment is
normal. No other significant bone abnormalities are identified.
IMPRESSION: Negative.

## 2020-02-11 IMAGING — CR LUMBAR SPINE - COMPLETE 4+ VIEW
5 series · 5 of 5 positions shown · non-contrast
Comparison: Abdominal radiograph dated 05/06/2018 and CT dated
04/30/2018

CLINICAL DATA: 63-year-old male with back pain.

EXAM:
LUMBAR SPINE - COMPLETE 4+ VIEW

[l-spine ap]
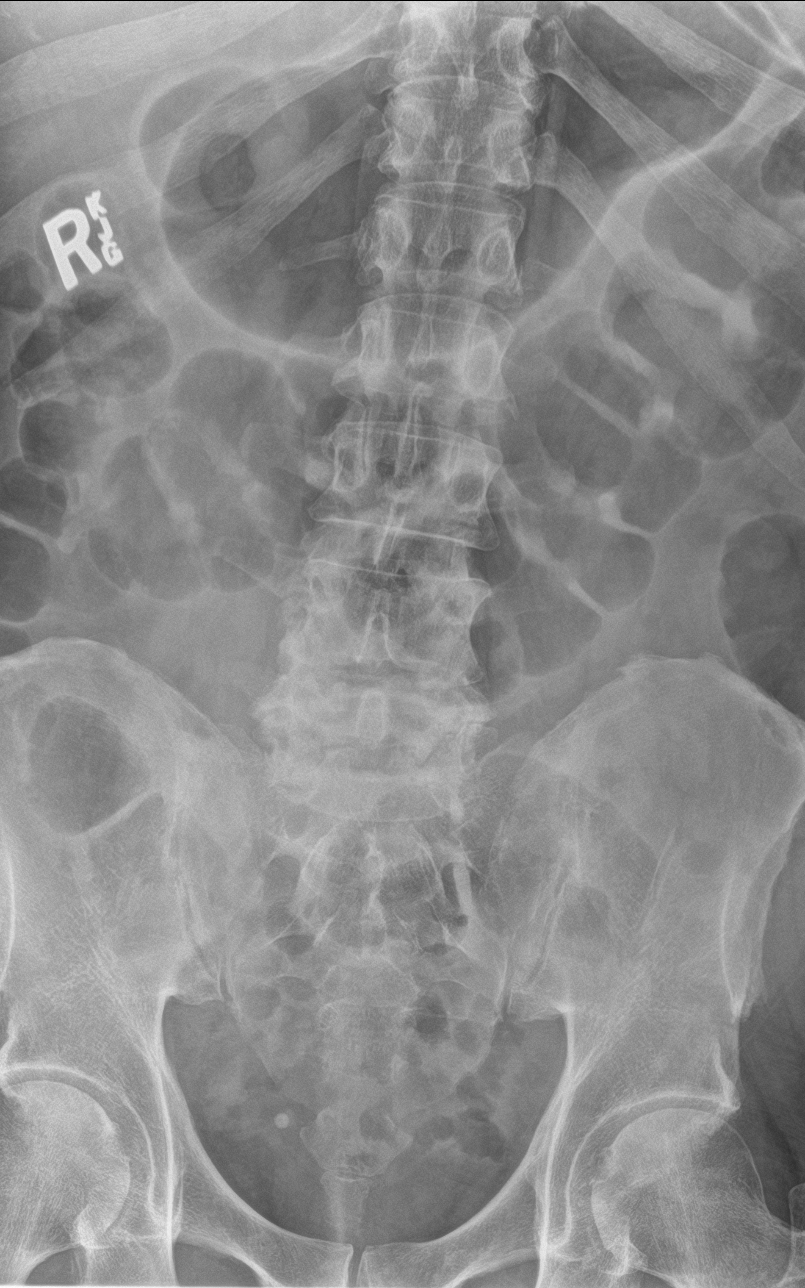

[l-spine obl (1 of 2)]
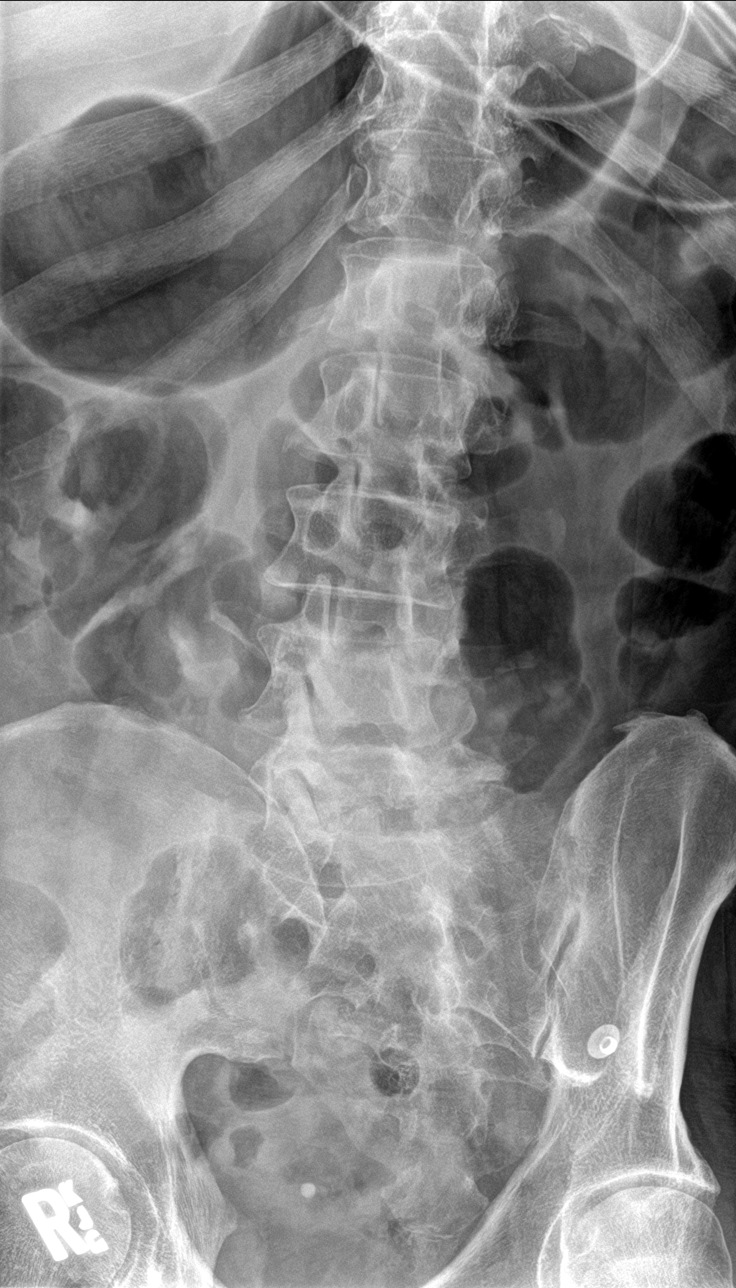

[l-spine obl (2 of 2)]
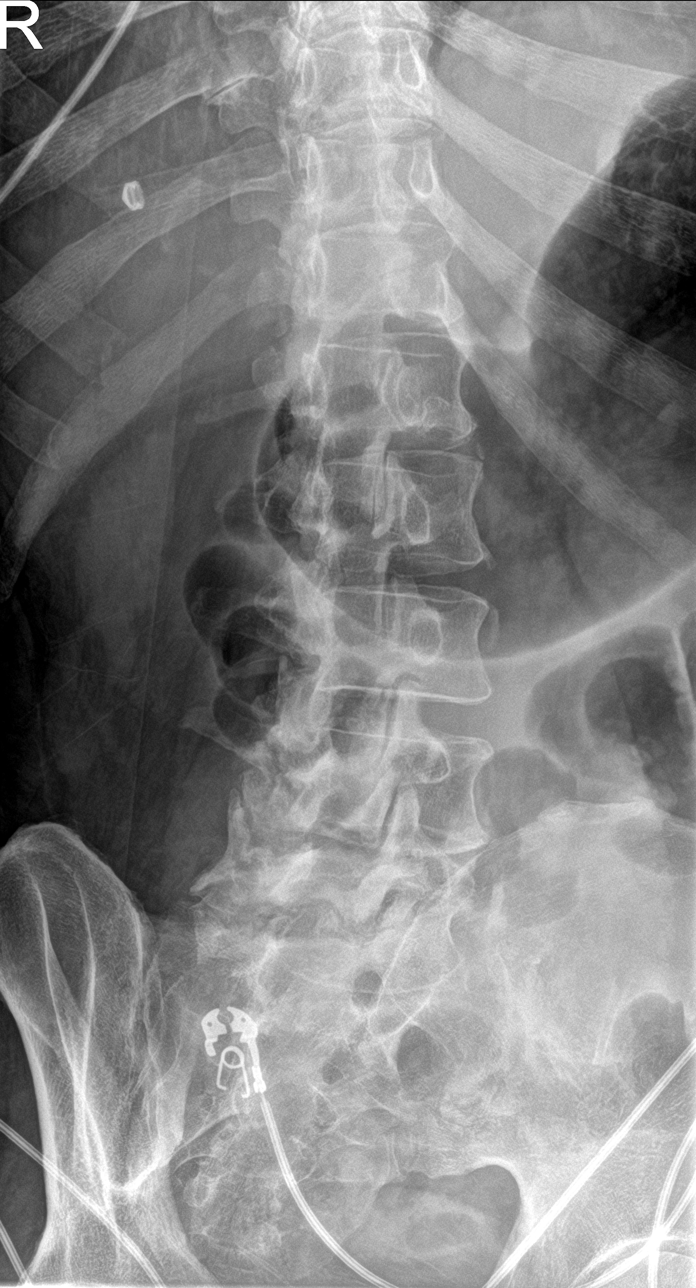

[l-spine lat]
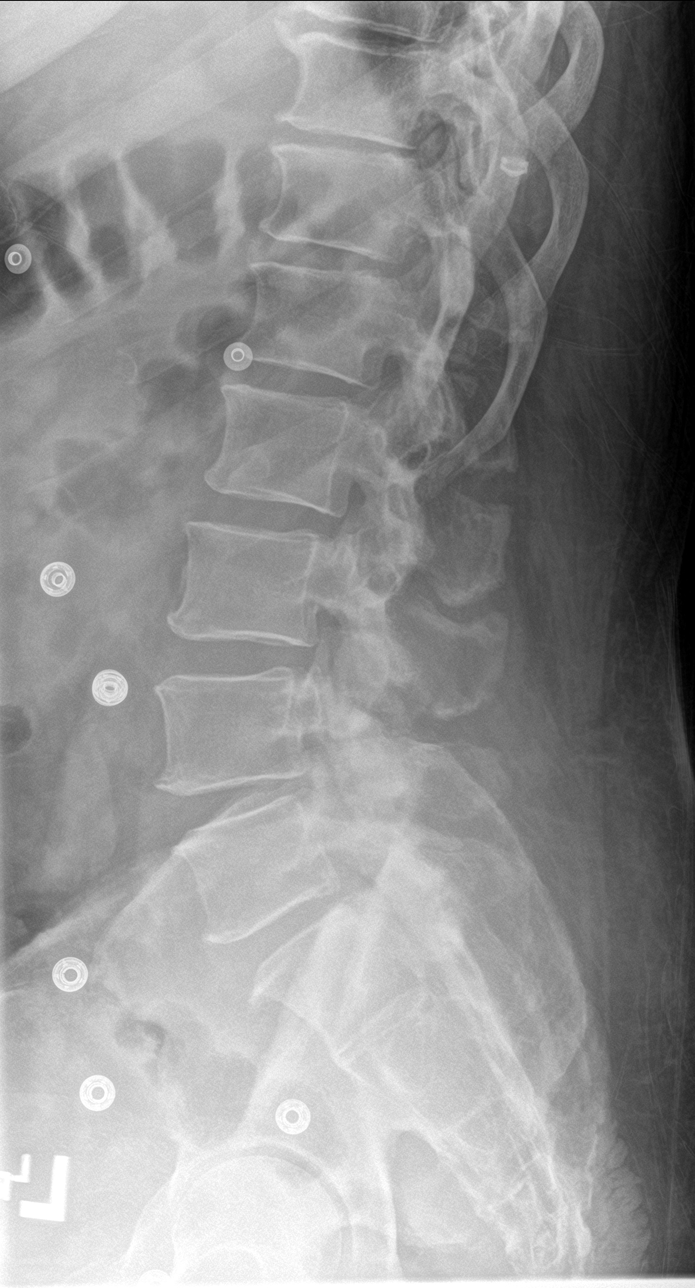

[l-spine spot]
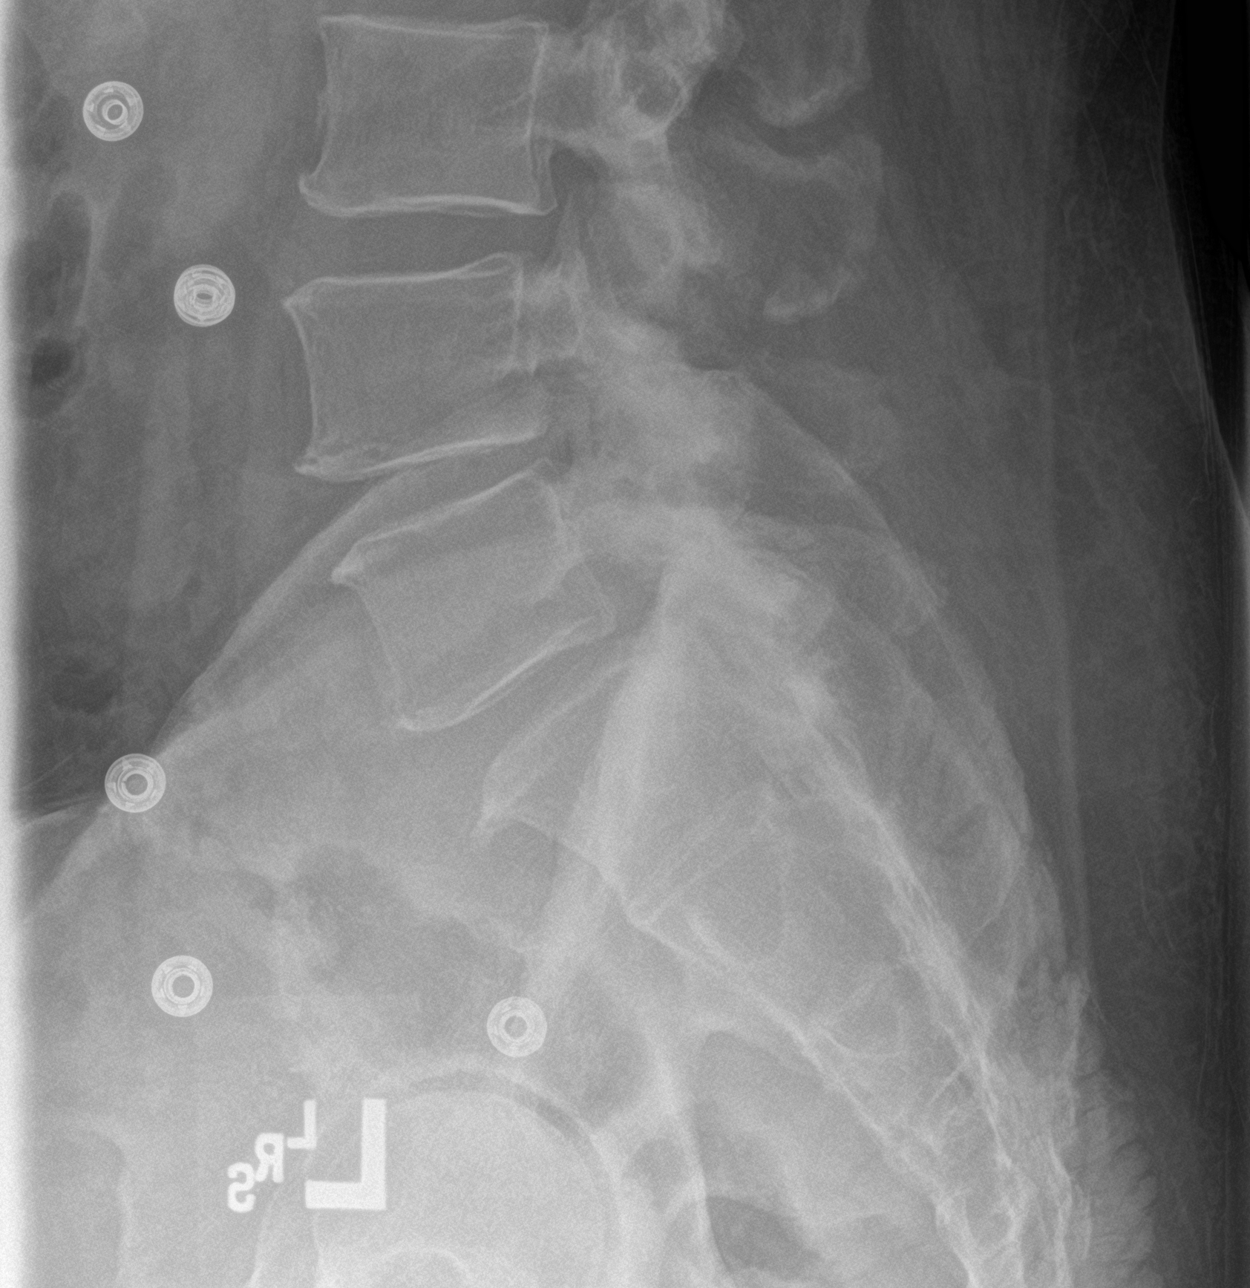

[5 of 5 positions shown; findings below may reference images not displayed]

FINDINGS: There is no acute fracture or subluxation of the lumbar spine. The
vertebral body heights and disc spaces are maintained. Multilevel
facet arthropathy. Grade 1 L3-L4 retrolisthesis. The soft tissues
are unremarkable.
IMPRESSION: 1. No acute/traumatic lumbar spine pathology.
2. Degenerative changes.

## 2020-02-12 IMAGING — CT CT HEAD WITHOUT CONTRAST
4 of 12 series · 15 of 47 positions shown, 17 images · non-contrast
Comparison: Multiple prior exams most recently head and cervical
spine CT 04/13/2019

CLINICAL DATA: Headache, post traumatic; C-spine fx, traumatic
headache and neck pain. Seizure.

EXAM:
CT HEAD WITHOUT CONTRAST
CT CERVICAL SPINE WITHOUT CONTRAST
TECHNIQUE: Multidetector CT imaging of the head and cervical spine was
performed following the standard protocol without intravenous
contrast. Multiplanar CT image reconstructions of the cervical spine
were also generated.

[Series 4: head bone · axial · 0.49mm/px · z∈[-155,-35]mm · 5 of 90 slices shown]
[im 15/90  bone]
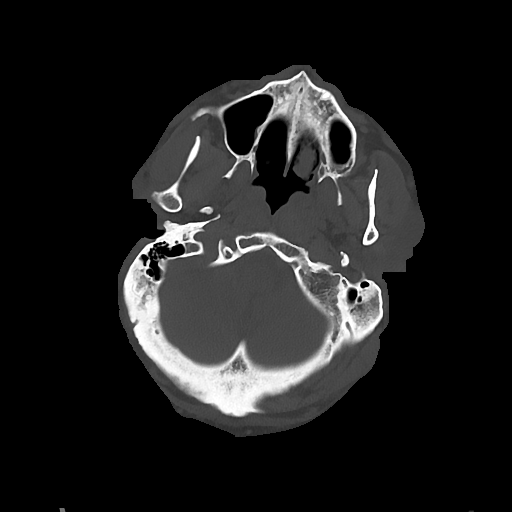
[im 30/90  bone]
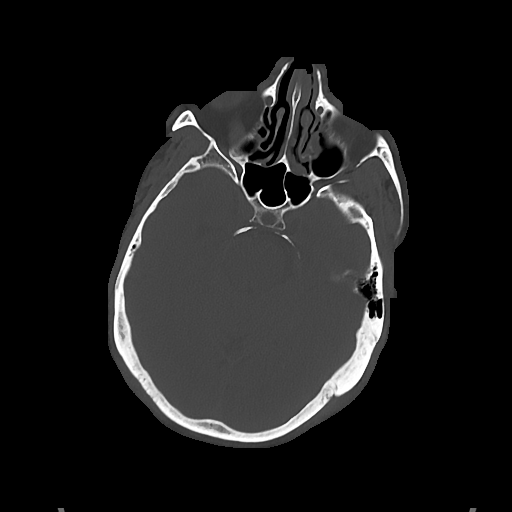
[im 45/90  bone]
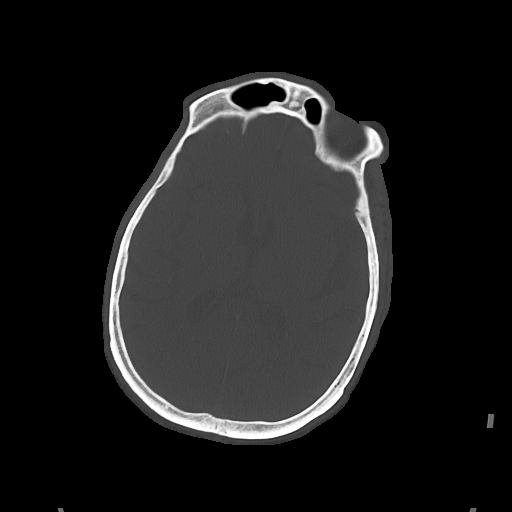
[im 60/90  bone]
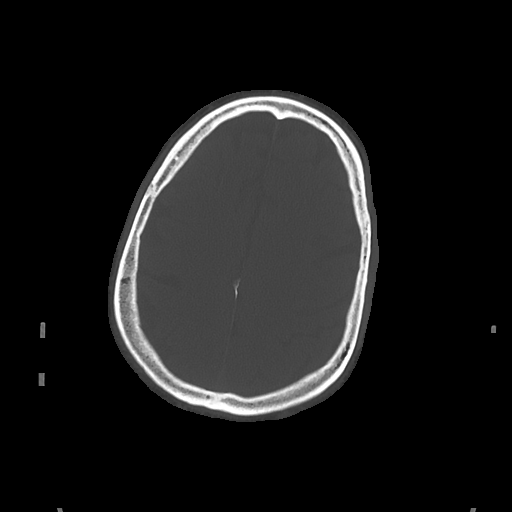
[im 75/90  bone]
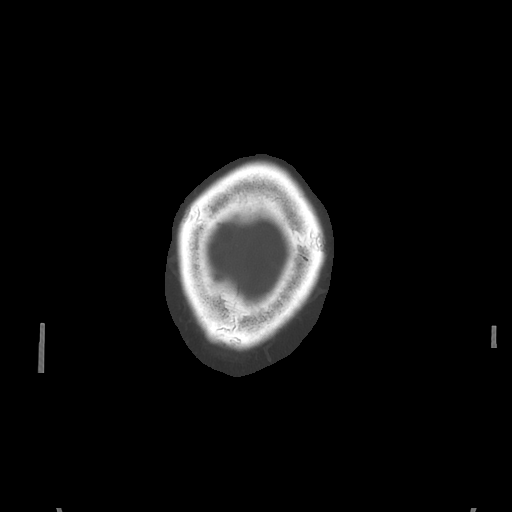

[Series 5: cor soft · coronal · 0.35mm/px · 3 of 67 slices shown]
[im 17/67  brain]
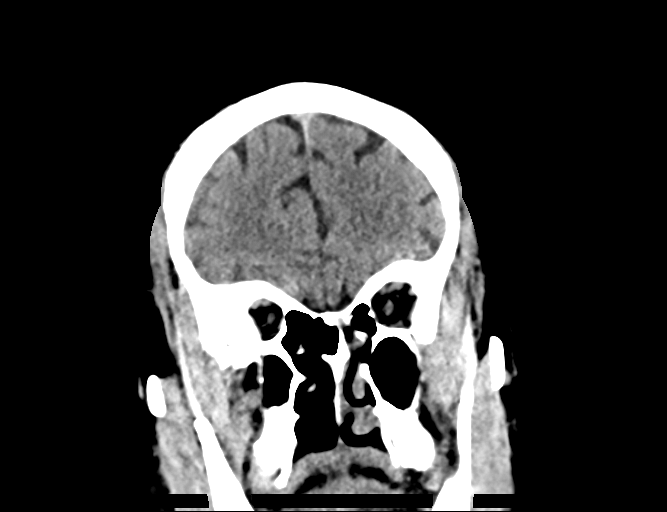
[im 34/67  brain]
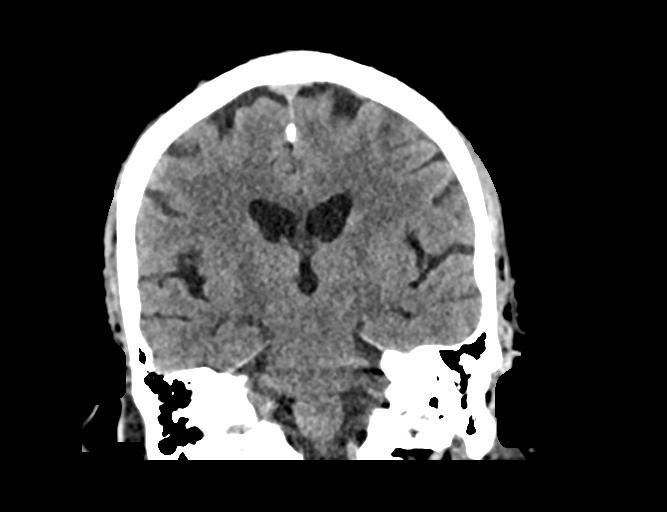
[im 50/67  brain]
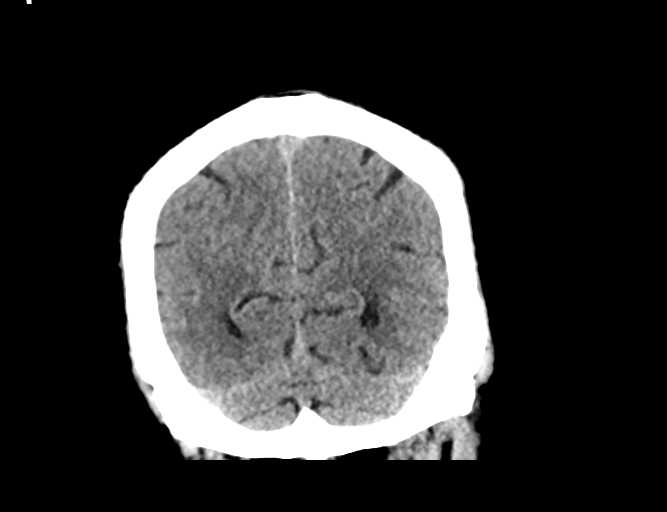

[Series 6: sag soft · sagittal · 0.35mm/px · 1 of 67 slices shown]
[im 34/67  brain]
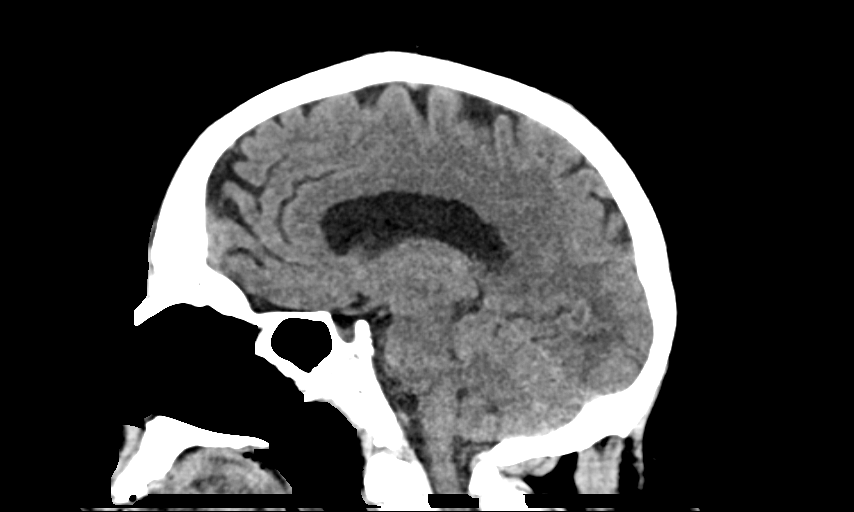

[Series 15: orthogonal axials · axial · 0.21mm/px · z∈[-304,-176]mm · 6 of 105 slices shown, 8 images]
[im 15/105  brain]
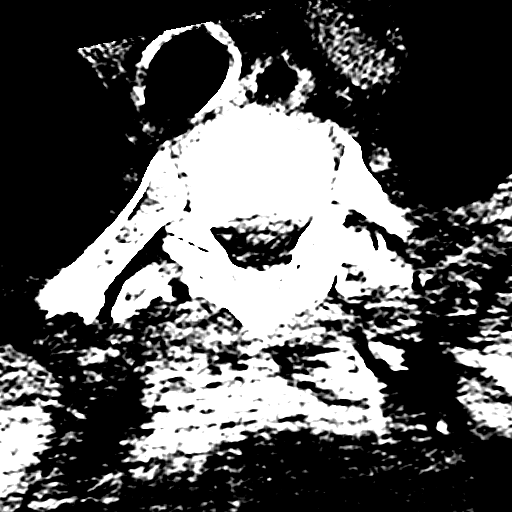
[im 15/105  bone]
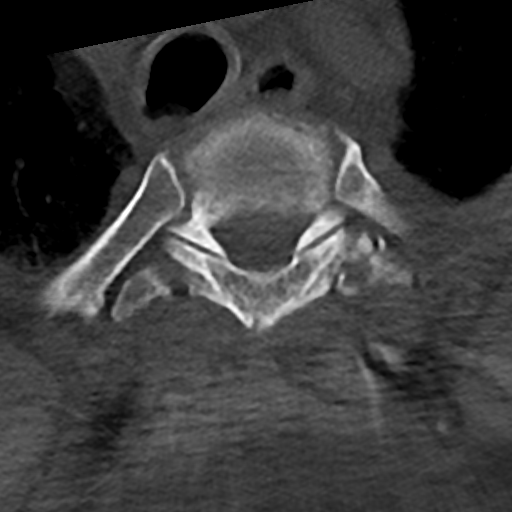
[im 30/105  brain]
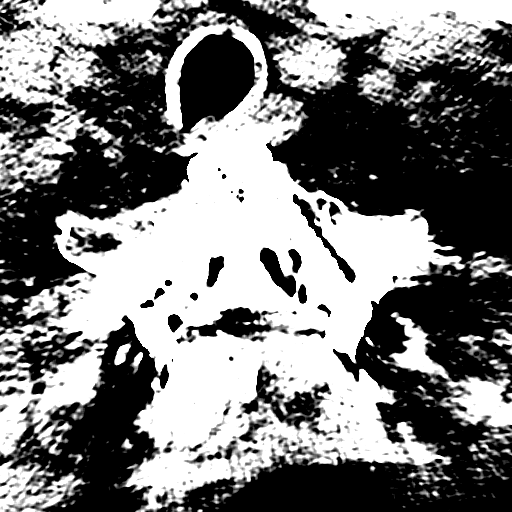
[im 45/105  brain]
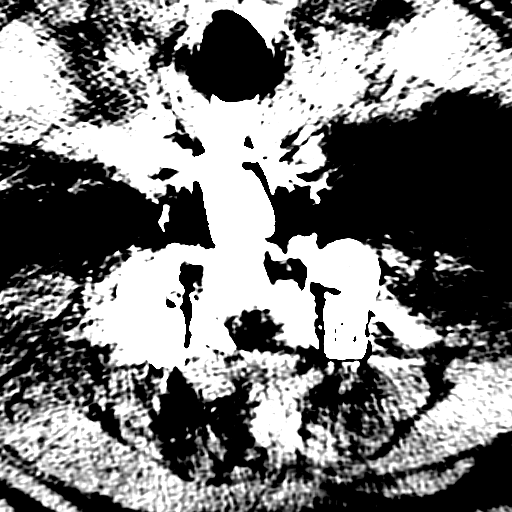
[im 60/105  brain]
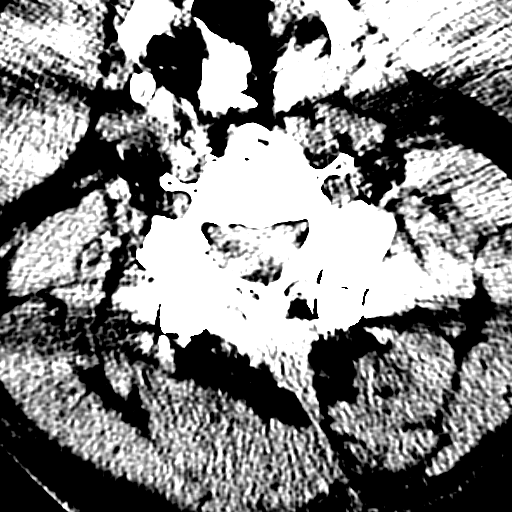
[im 75/105  brain]
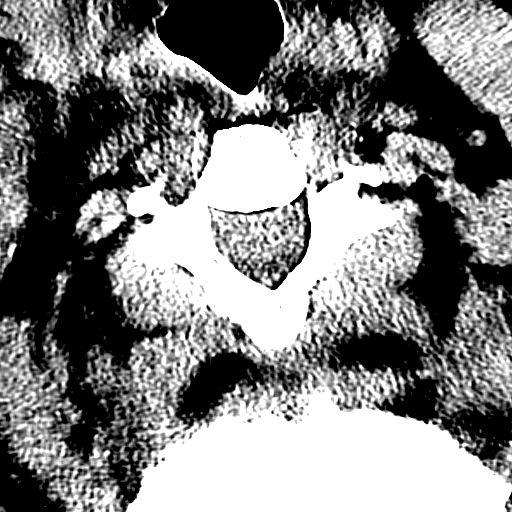
[im 75/105  bone]
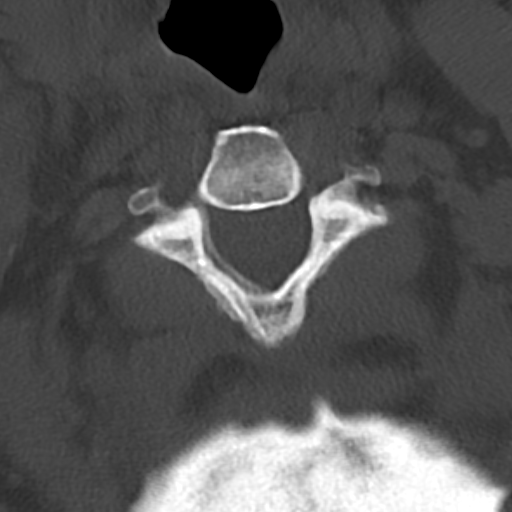
[im 90/105  brain]
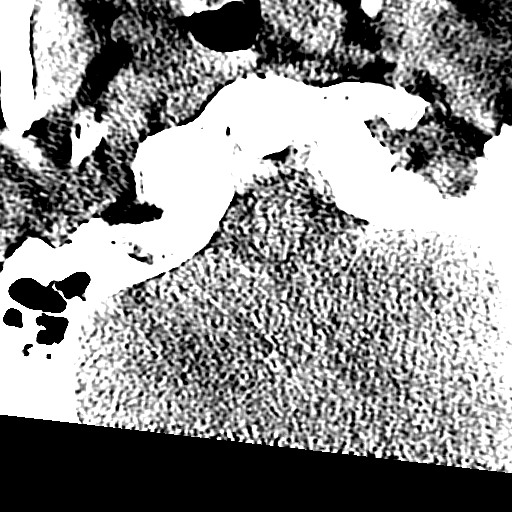

[15 of 47 positions shown; findings below may reference images not displayed]

FINDINGS: CT HEAD FINDINGS

Brain: No intracranial hemorrhage, mass effect, or midline shift.
Age related atrophy. Similar chronic small vessel ischemia to prior.
No hydrocephalus. The basilar cisterns are patent. No evidence of
territorial infarct or acute ischemia. No extra-axial or
intracranial fluid collection.

Vascular: No hyperdense vessel.

Skull: No fracture or focal lesion.

Sinuses/Orbits: Minor mucosal thickening of ethmoid air cells. No
sinus fluid levels. Mastoid air cells are clear. Orbits are
unremarkable.

Other: None.

CT CERVICAL SPINE FINDINGS

Alignment: Postsurgical straightening. No traumatic subluxation.

Skull base and vertebrae: No acute fracture. Dens and skull base are
intact. Posterior C3 through T1 fusion and laminectomy. Anterior C4
through C7 fusion. No acute fracture. The dens and skull base are
intact. Streak artifact from surgical hardware.

Soft tissues and spinal canal: No prevertebral soft tissue edema.
Canal partially obscured by streak artifact.

Disc levels: Anterior fusion with discectomy C4-C5 through C6-C7.
Mild endplate spurring C3-C4.

Upper chest: No acute findings.

Other: None.
IMPRESSION: 1. No acute intracranial abnormality. No skull fracture. Mild
chronic small vessel ischemia is unchanged from prior.
2. Postsurgical change in the cervical spine without acute fracture
or subluxation.

## 2020-03-14 IMAGING — DX PORTABLE CHEST - 1 VIEW
1 series · 1 of 1 positions shown · non-contrast
Comparison: 05/10/2019 and prior radiograph

CLINICAL DATA: Seizure.

EXAM:
PORTABLE CHEST 1 VIEW

[chest ap]
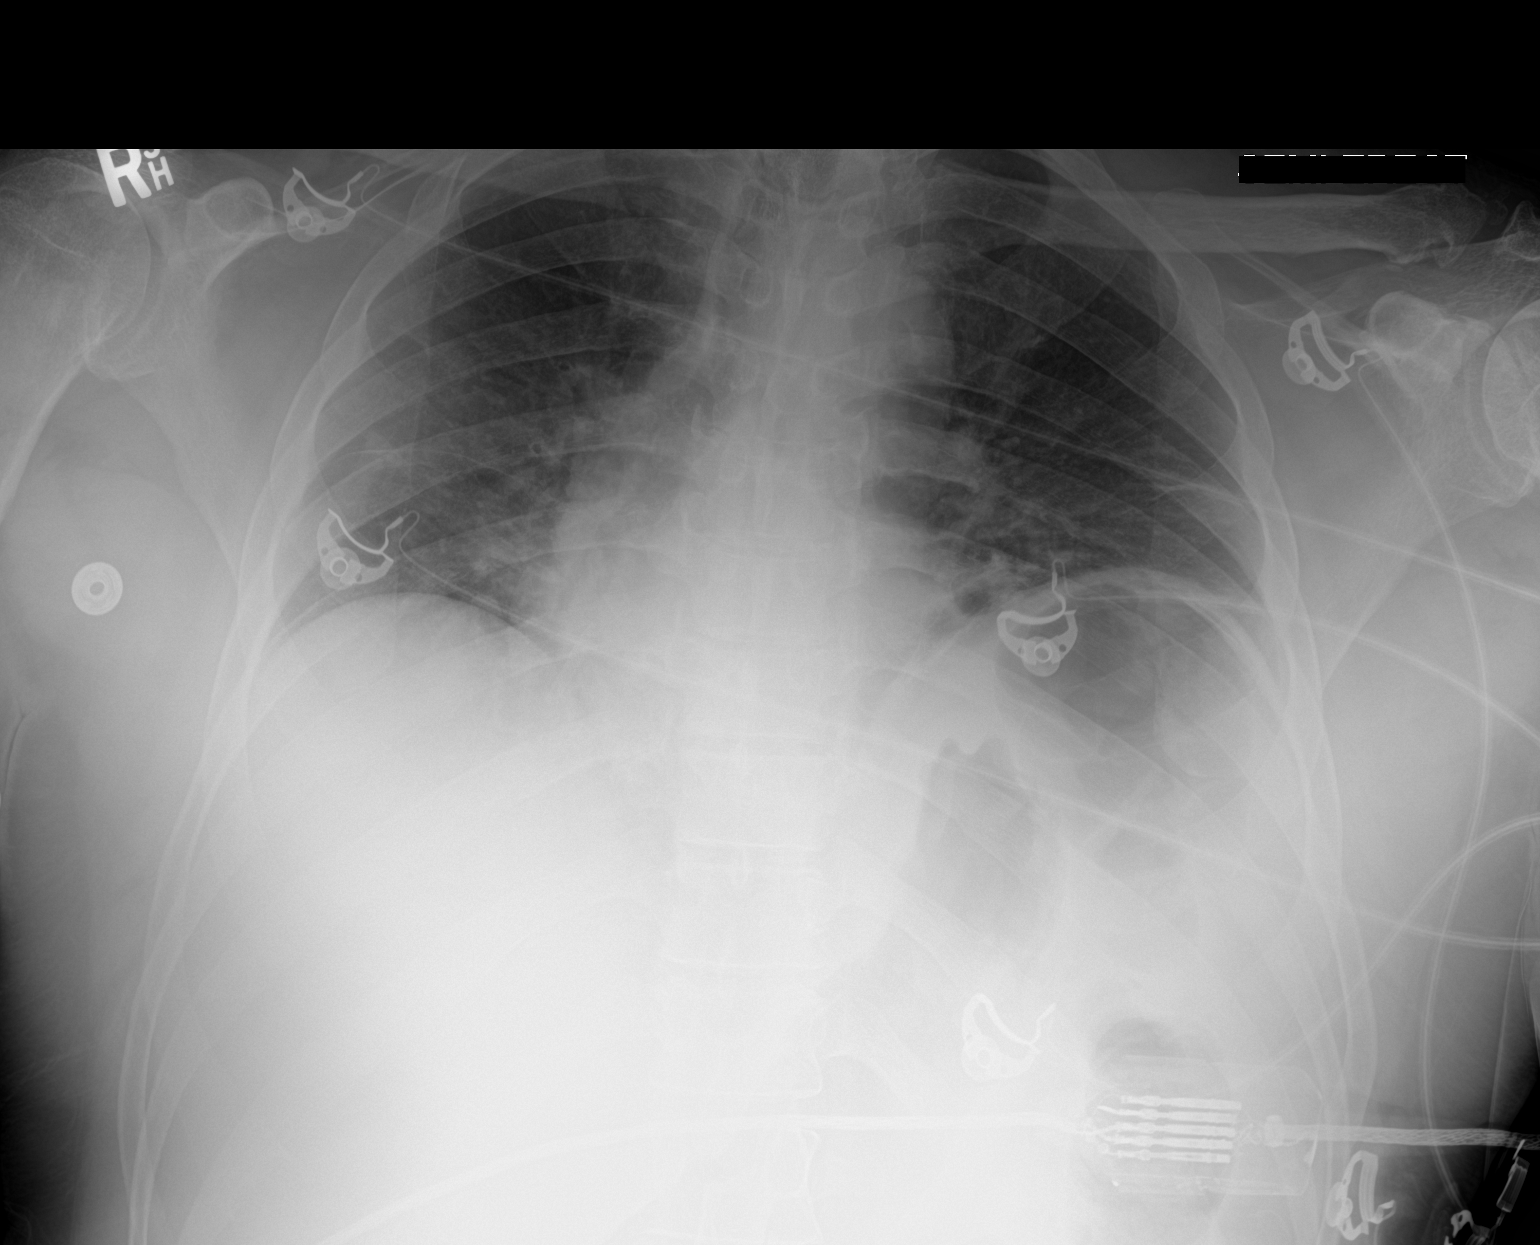

[1 of 1 positions shown; findings below may reference images not displayed]

FINDINGS: This is a low volume study.

The cardiomediastinal silhouette is unremarkable.

Mild bibasilar atelectasis noted.

There is no evidence of focal airspace disease, pulmonary edema,
suspicious pulmonary nodule/mass, pleural effusion, or pneumothorax.

No acute bony abnormalities are identified. Cervical fusion hardware
again noted.
IMPRESSION: Low volume film with mild bibasilar atelectasis.

## 2020-05-22 IMAGING — CT CT CERVICAL SPINE W/O CM
3 of 4 series · 13 of 33 positions shown, 16 images · non-contrast
Comparison: None.

CT head 06/11/2019

CLINICAL DATA: Seizure.  Possible fall

EXAM:
CT HEAD WITHOUT CONTRAST
CT CERVICAL SPINE WITHOUT CONTRAST
TECHNIQUE: Multidetector CT imaging of the head and cervical spine was
performed following the standard protocol without intravenous
contrast. Multiplanar CT image reconstructions of the cervical spine
were also generated.

[Series 5: c_spine 2.0 st · axial · 0.35mm/px · z∈[-282,-152]mm · 5 of 99 slices shown, 7 images]
[im 17/99  soft-tissue]
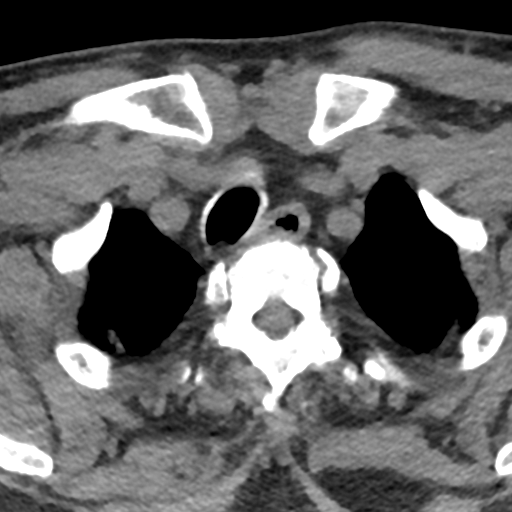
[im 17/99  bone]
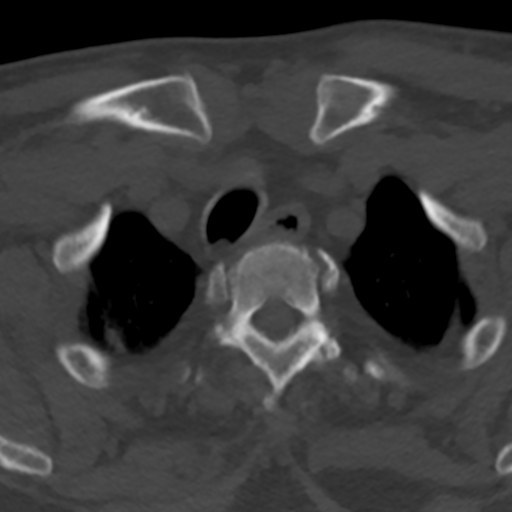
[im 33/99  bone]
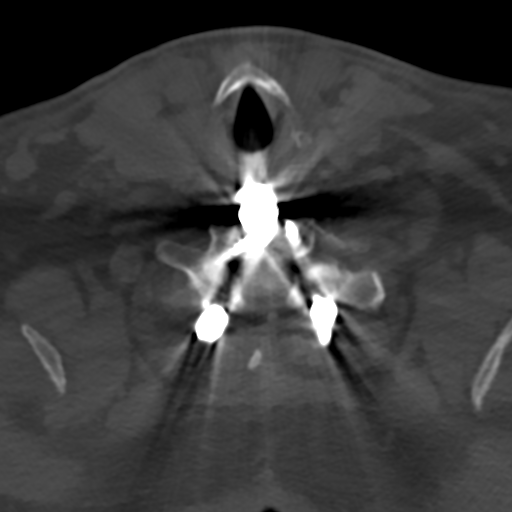
[im 50/99  bone]
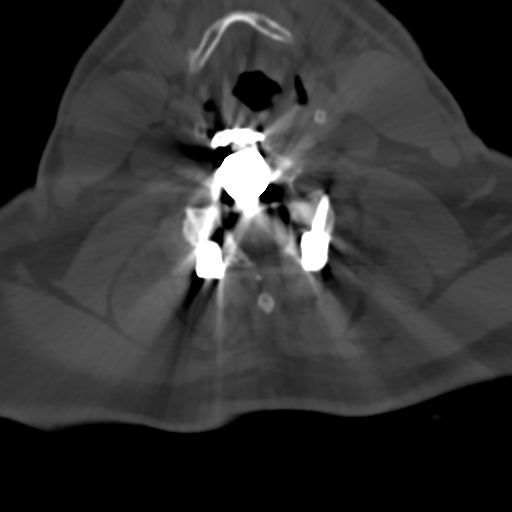
[im 66/99  bone]
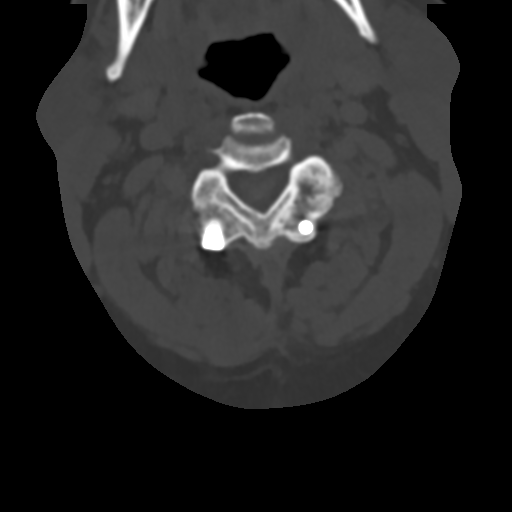
[im 82/99  soft-tissue]
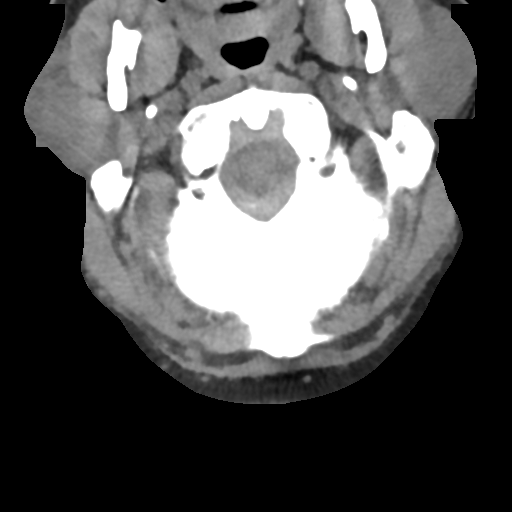
[im 82/99  bone]
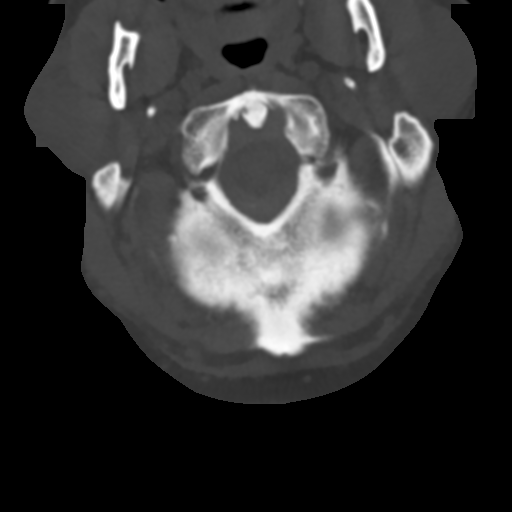

[Series 6: coronal bone · coronal · 0.29mm/px · 3 of 61 slices shown]
[im 13/61  bone]
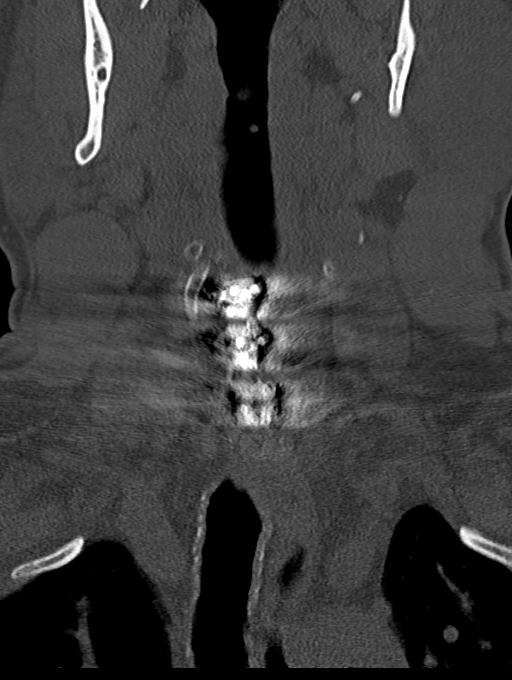
[im 25/61  bone]
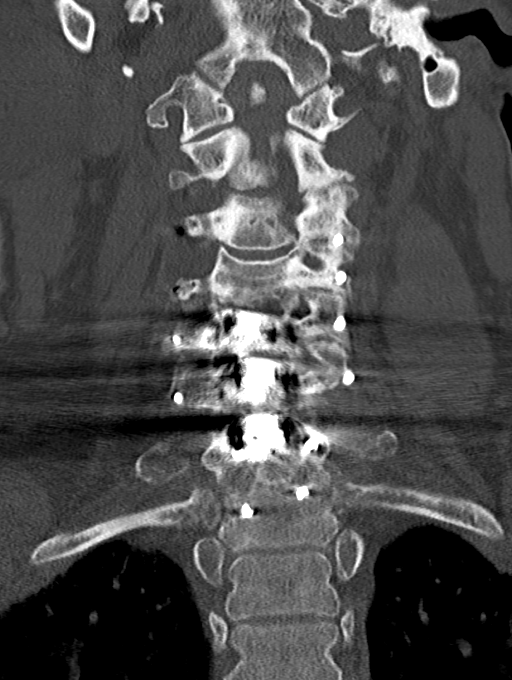
[im 37/61  bone]
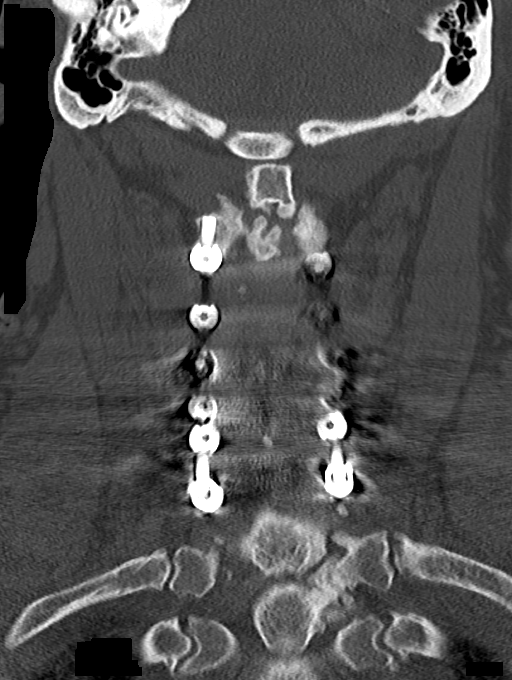

[Series 7: sagittal bone · sagittal · 0.29mm/px · 5 of 61 slices shown, 6 images]
[im 21/61  bone]
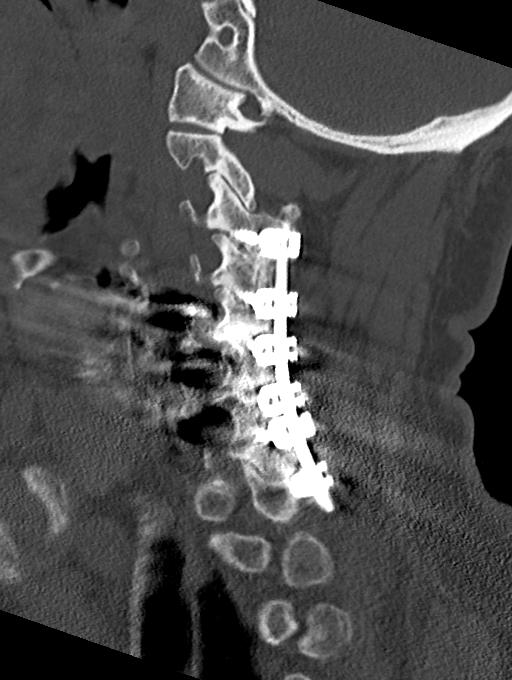
[im 26/61  bone]
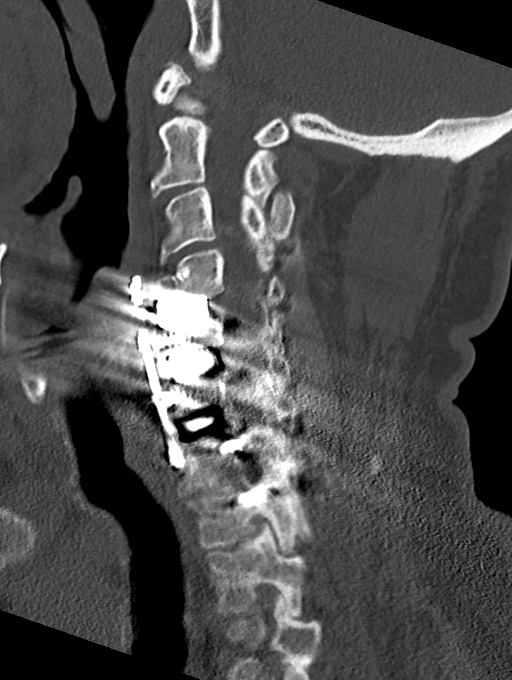
[im 31/61  soft-tissue]
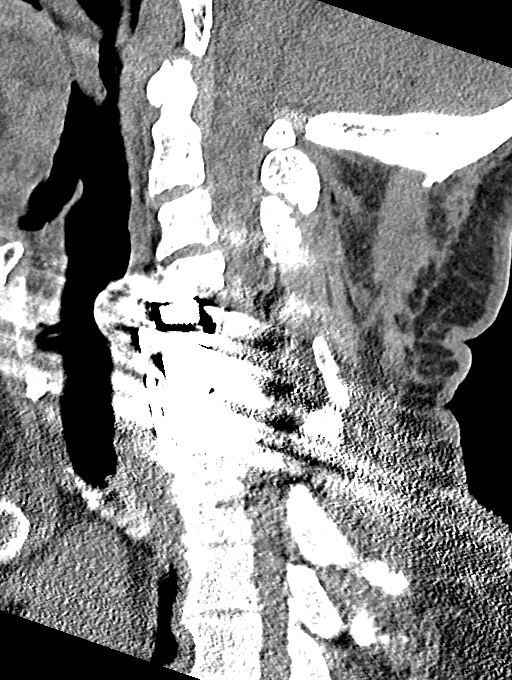
[im 31/61  bone]
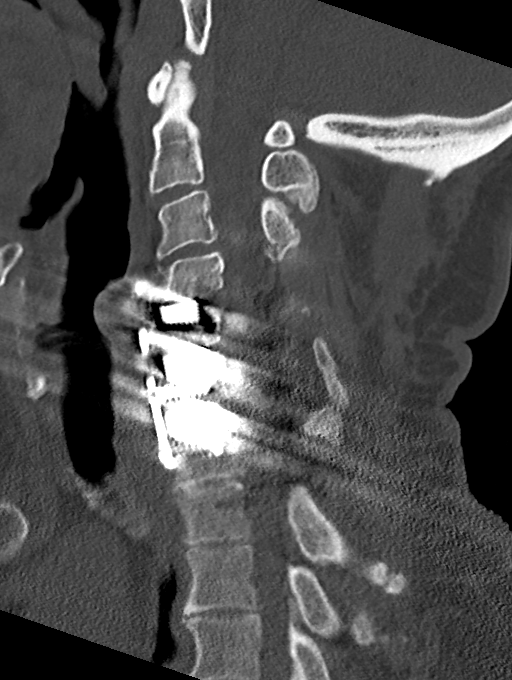
[im 36/61  bone]
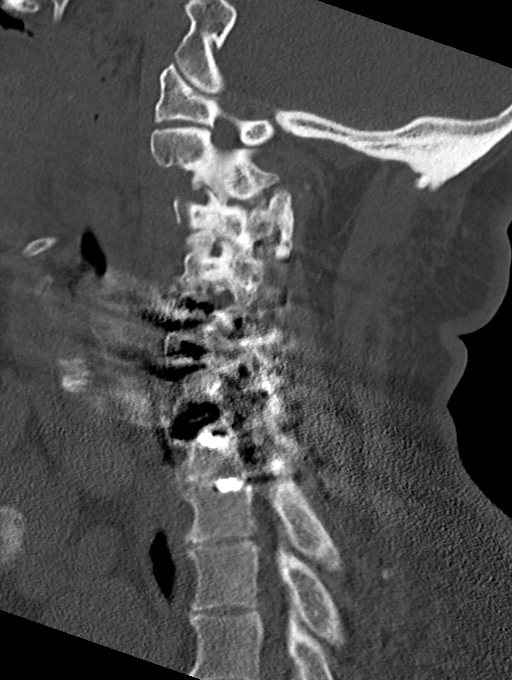
[im 41/61  bone]
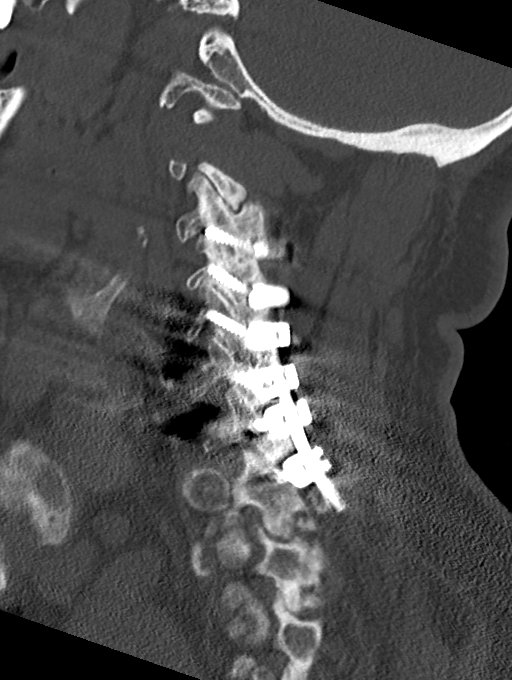

[13 of 33 positions shown; findings below may reference images not displayed]

FINDINGS: CT HEAD FINDINGS

Brain: Mild atrophy. Mild white matter disease which appears
chronic. No acute infarct, hemorrhage, mass.

Vascular: Negative for hyperdense vessel

Skull: Negative

Sinuses/Orbits: Negative

Other: None

CT CERVICAL SPINE FINDINGS

Alignment: Normal

Skull base and vertebrae: Negative for fracture. There is extensive
artifact from anterior and posterior fusion hardware.

Soft tissues and spinal canal: Negative

Disc levels: ACDF C4 through C7 without complication. Posterior
screw and rod fusion extending from C3 through T1. Hardware in good
position. No hardware failure.

Upper chest: Mild atelectasis right upper lobe. No acute abnormality

Other: None
IMPRESSION: 1. No acute intracranial abnormality
2. Anterior and posterior fusion hardware in the cervical spine.
Considerable artifact from hardware. Allowing for this, no acute
abnormality in the cervical spine.

## 2020-05-22 IMAGING — CT CT HEAD W/O CM
3 series · 15 of 47 positions shown, 18 images · non-contrast
Comparison: None.

CT head 06/11/2019

CLINICAL DATA: Seizure.  Possible fall

EXAM:
CT HEAD WITHOUT CONTRAST
CT CERVICAL SPINE WITHOUT CONTRAST
TECHNIQUE: Multidetector CT imaging of the head and cervical spine was
performed following the standard protocol without intravenous
contrast. Multiplanar CT image reconstructions of the cervical spine
were also generated.

[Series 3: head 5.0 h30s · axial · 0.42mm/px · z∈[-136,+9]mm · 9 of 35 slices shown, 12 images]
[im 3/35  brain]
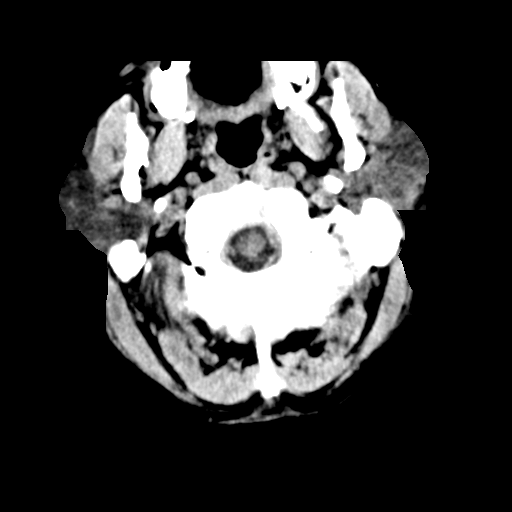
[im 3/35  bone]
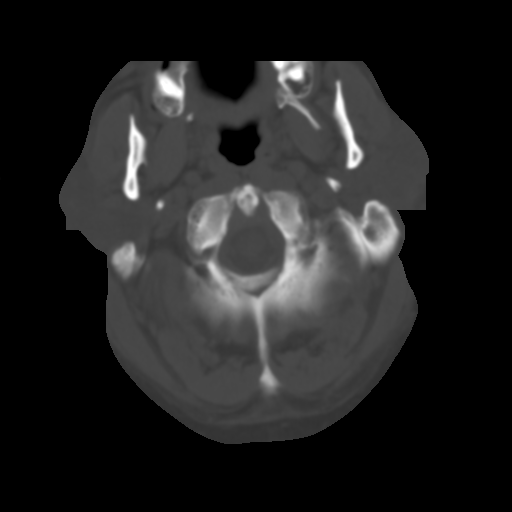
[im 6/35  brain]
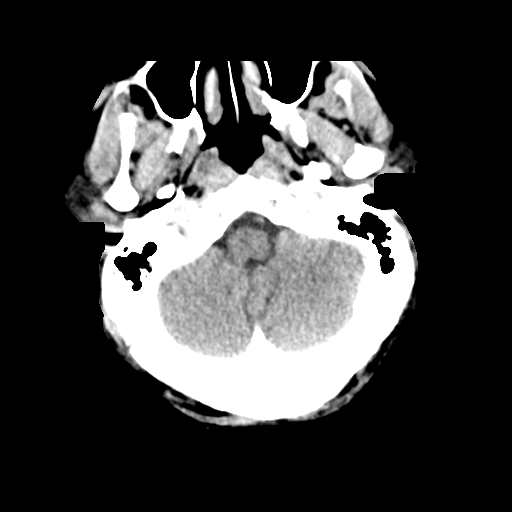
[im 10/35  brain]
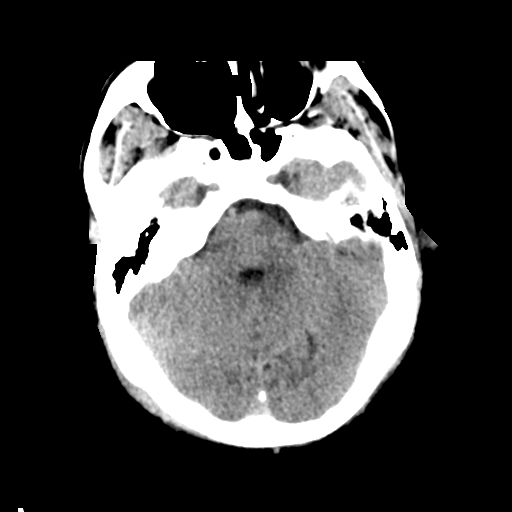
[im 13/35  brain]
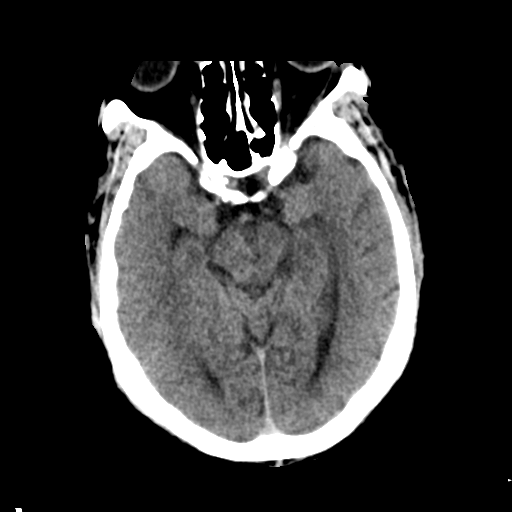
[im 18/35  brain]
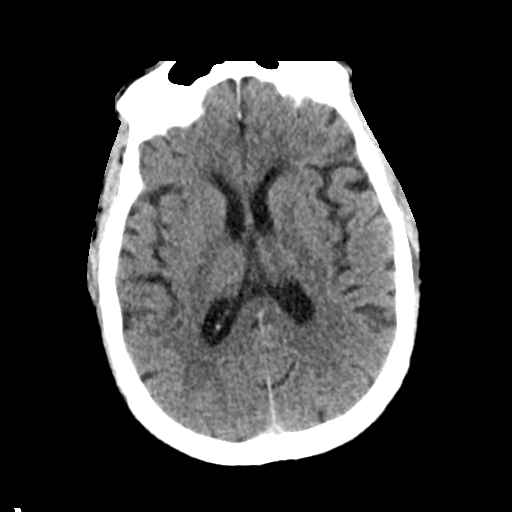
[im 18/35  bone]
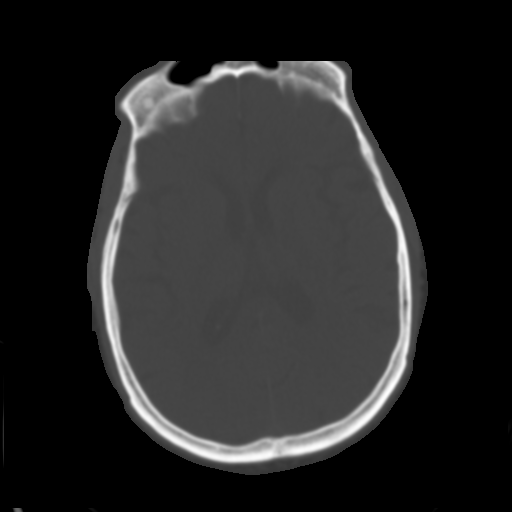
[im 22/35  brain]
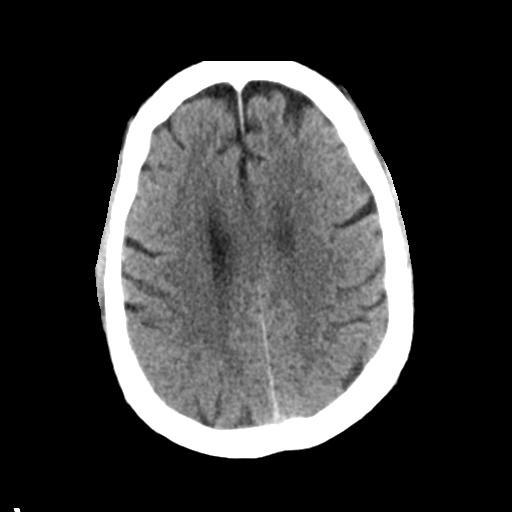
[im 25/35  brain]
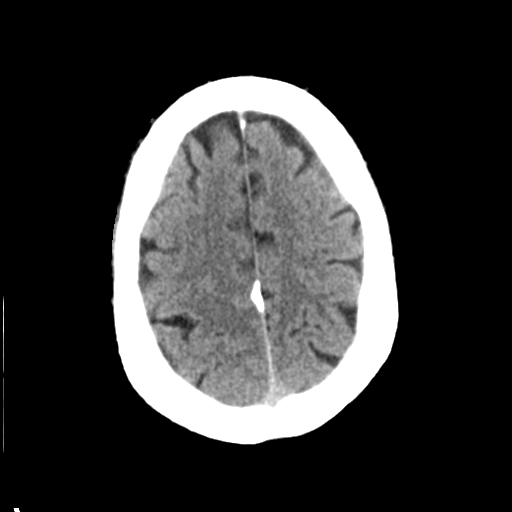
[im 29/35  brain]
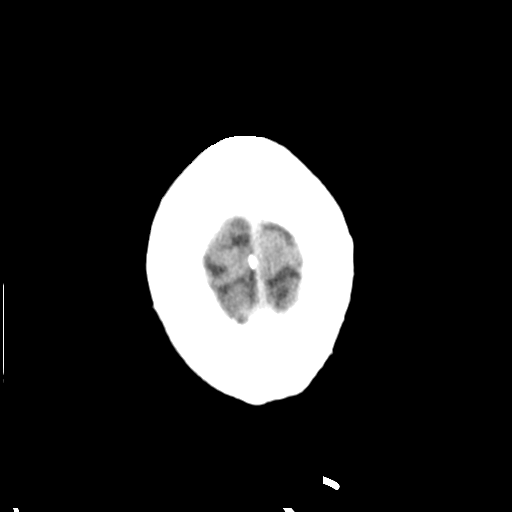
[im 32/35  brain]
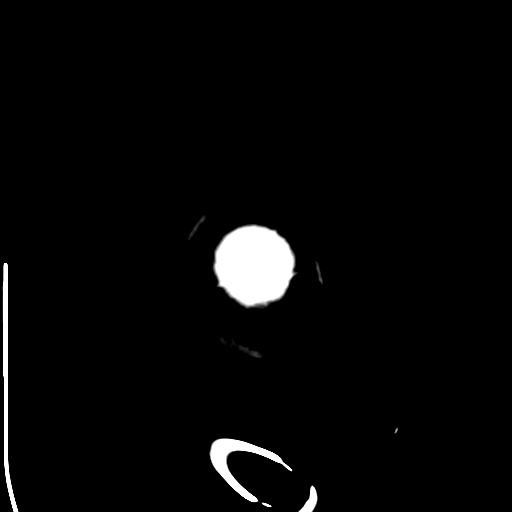
[im 32/35  bone]
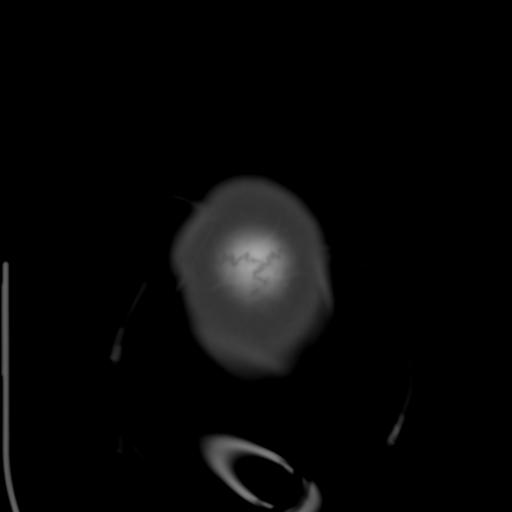

[Series 5: head 3.0 mpr cor · coronal · 0.34mm/px · 3 of 67 slices shown]
[im 23/67  brain]
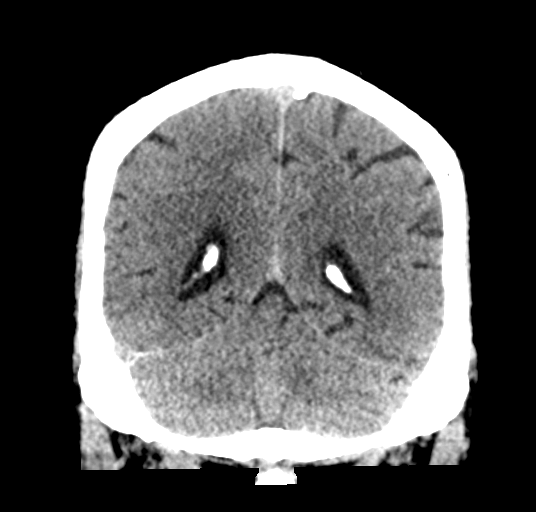
[im 30/67  brain]
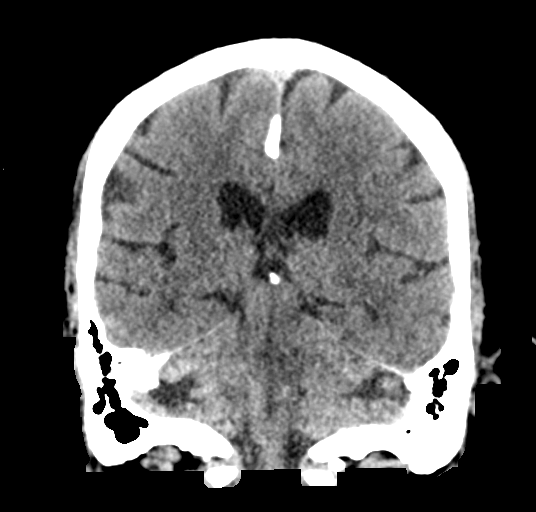
[im 37/67  brain]
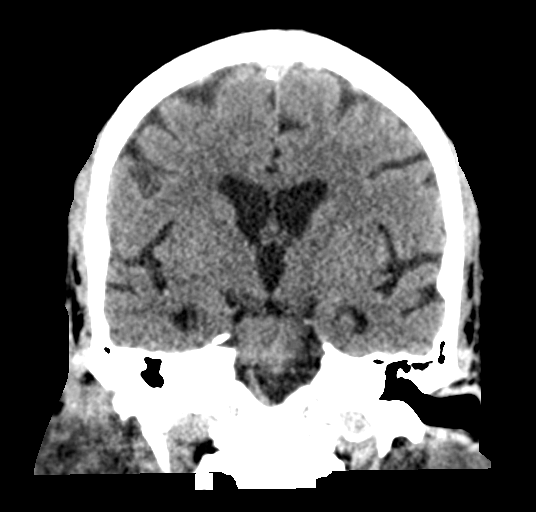

[Series 6: head 3.0 mpr sag · sagittal · 0.32mm/px · 3 of 66 slices shown]
[im 22/66  brain]
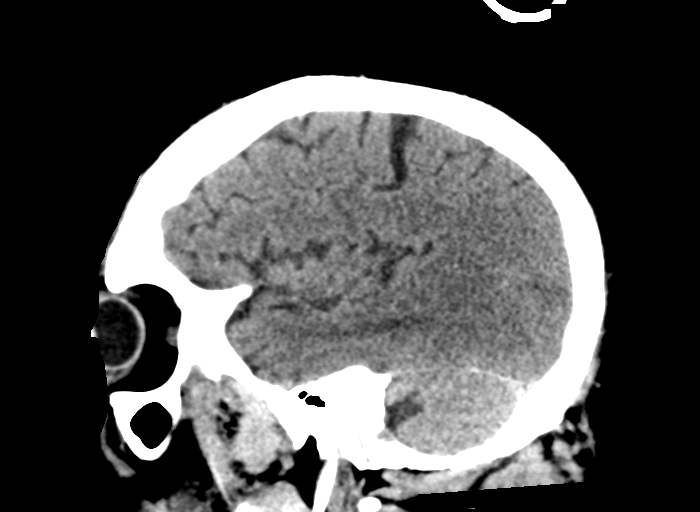
[im 33/66  brain]
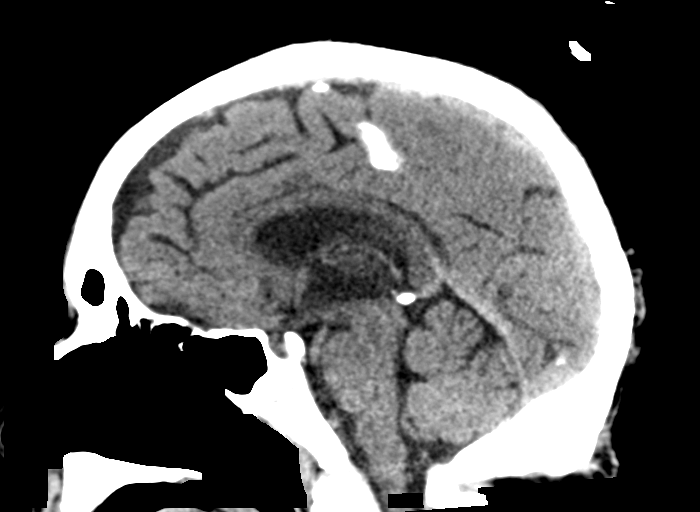
[im 44/66  brain]
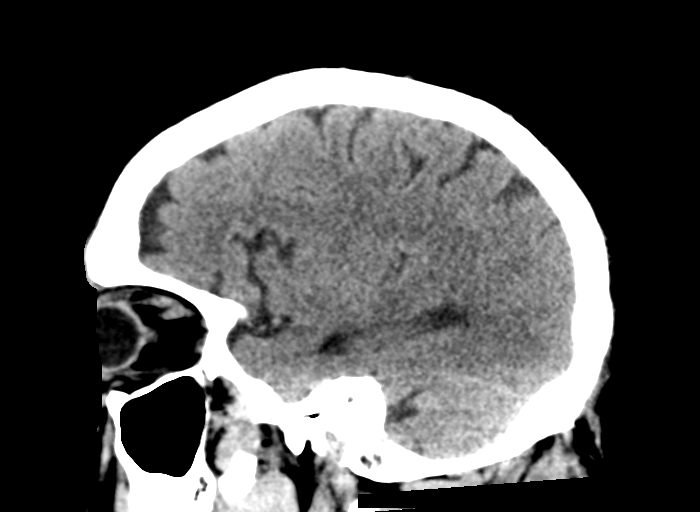

[15 of 47 positions shown; findings below may reference images not displayed]

FINDINGS: CT HEAD FINDINGS

Brain: Mild atrophy. Mild white matter disease which appears
chronic. No acute infarct, hemorrhage, mass.

Vascular: Negative for hyperdense vessel

Skull: Negative

Sinuses/Orbits: Negative

Other: None

CT CERVICAL SPINE FINDINGS

Alignment: Normal

Skull base and vertebrae: Negative for fracture. There is extensive
artifact from anterior and posterior fusion hardware.

Soft tissues and spinal canal: Negative

Disc levels: ACDF C4 through C7 without complication. Posterior
screw and rod fusion extending from C3 through T1. Hardware in good
position. No hardware failure.

Upper chest: Mild atelectasis right upper lobe. No acute abnormality

Other: None
IMPRESSION: 1. No acute intracranial abnormality
2. Anterior and posterior fusion hardware in the cervical spine.
Considerable artifact from hardware. Allowing for this, no acute
abnormality in the cervical spine.

## 2020-05-22 IMAGING — CT CT ABD-PELV W/ CM
2 of 5 series · 16 of 46 positions shown, 18 images · IV contrast (omnipaque)
Comparison: CT abdomen pelvis 04/30/2018

CLINICAL DATA: Abdominal mass

EXAM:
CT ABDOMEN AND PELVIS WITH CONTRAST
TECHNIQUE: Multidetector CT imaging of the abdomen and pelvis was performed
using the standard protocol following bolus administration of
intravenous contrast.
CONTRAST:  100mL OMNIPAQUE IOHEXOL 300 MG/ML  SOLN

[Series 3: abd/ pelvis 5.0 i30f 2 · axial · 0.82mm/px · z∈[+786,+1256]mm · 13 of 106 slices shown, 15 images]
[im 6/106  soft-tissue]
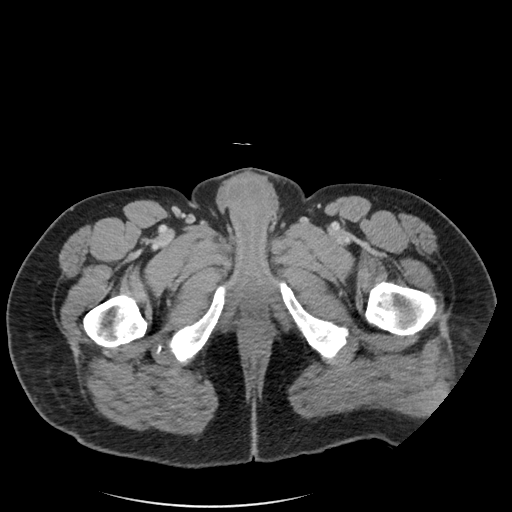
[im 6/106  bone]
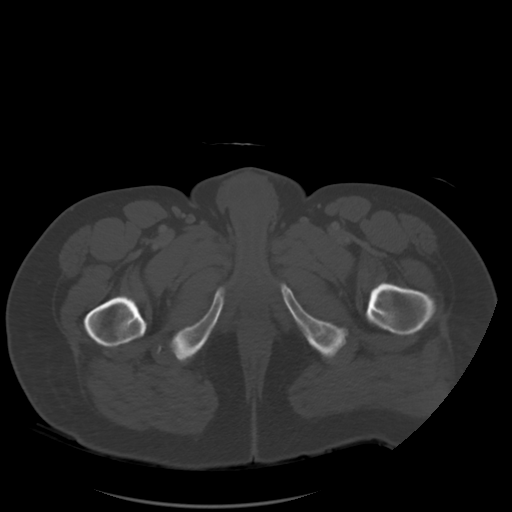
[im 17/106  soft-tissue]
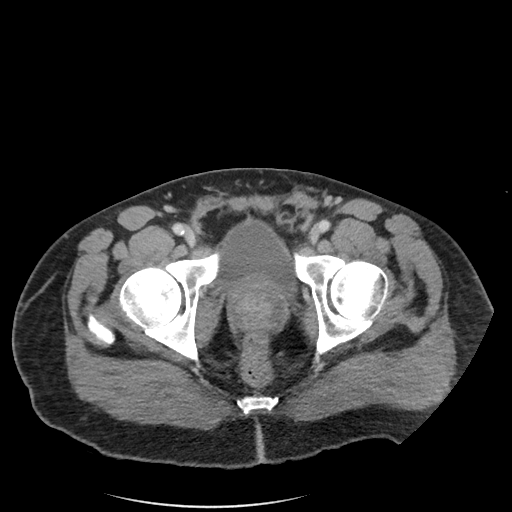
[im 23/106  soft-tissue]
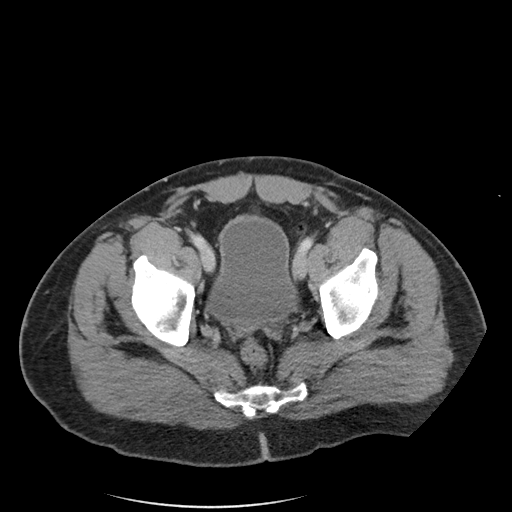
[im 28/106  soft-tissue]
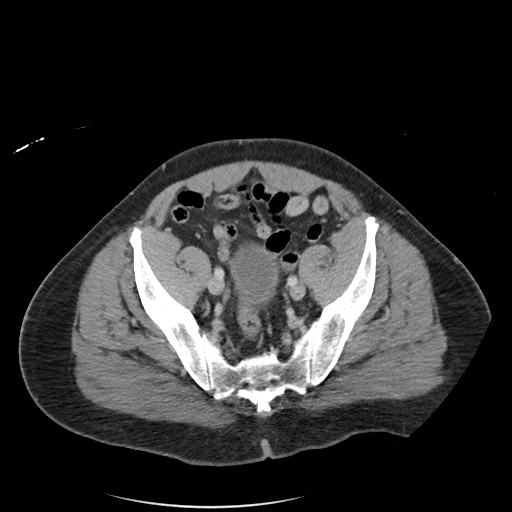
[im 39/106  soft-tissue]
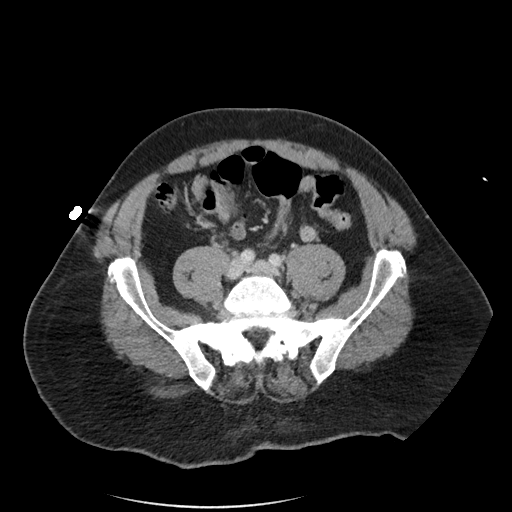
[im 45/106  soft-tissue]
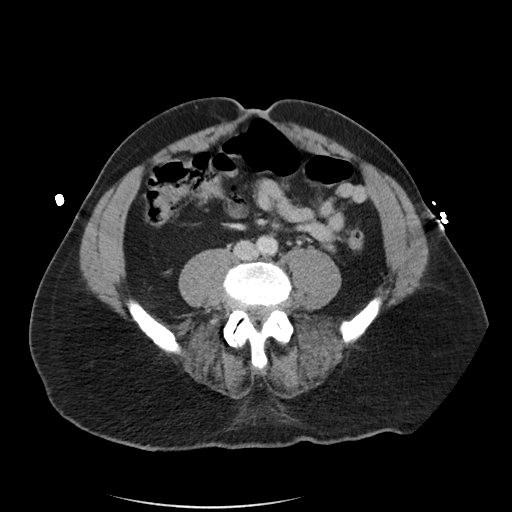
[im 56/106  soft-tissue]
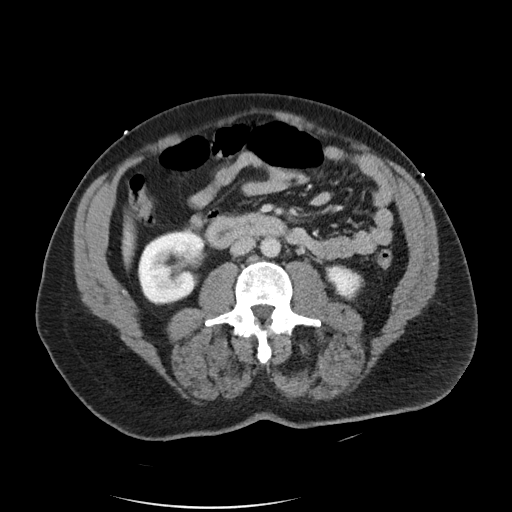
[im 61/106  soft-tissue]
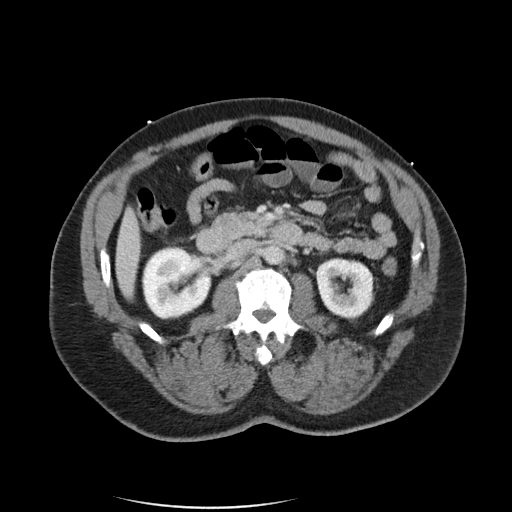
[im 67/106  soft-tissue]
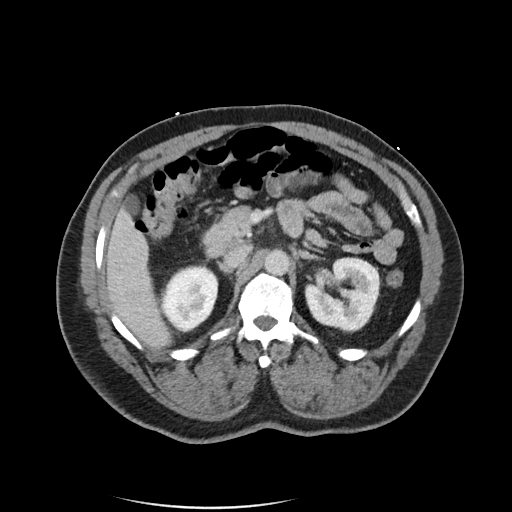
[im 67/106  bone]
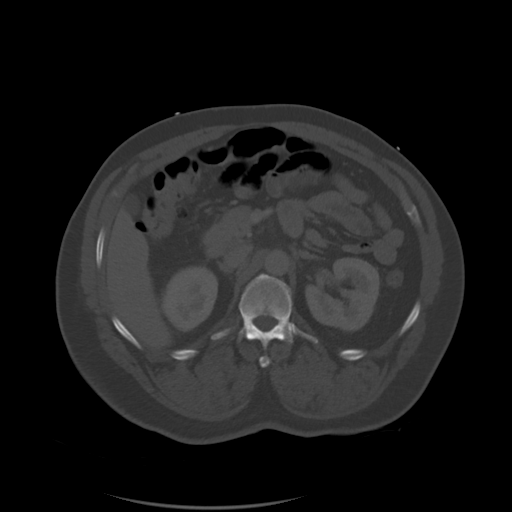
[im 78/106  soft-tissue]
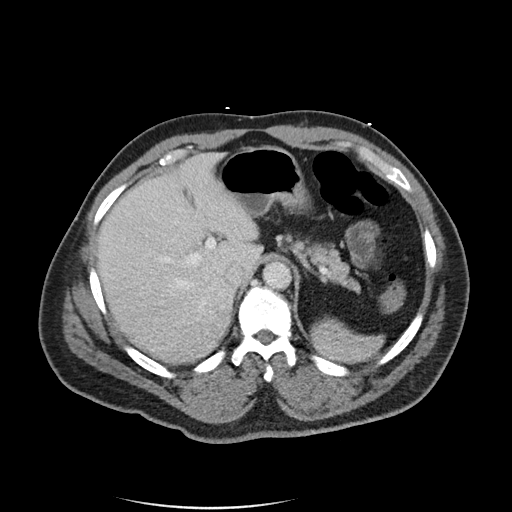
[im 83/106  soft-tissue]
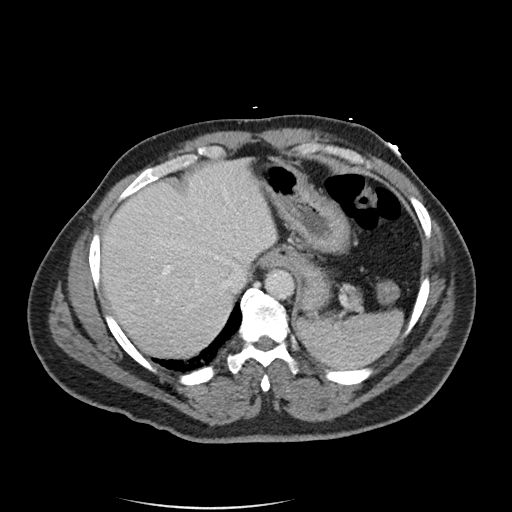
[im 89/106  soft-tissue]
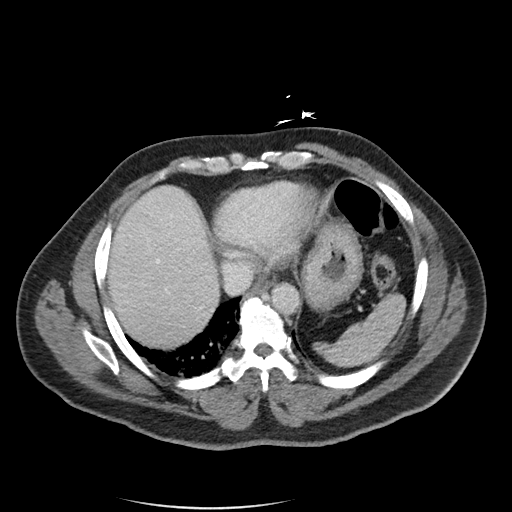
[im 100/106  soft-tissue]
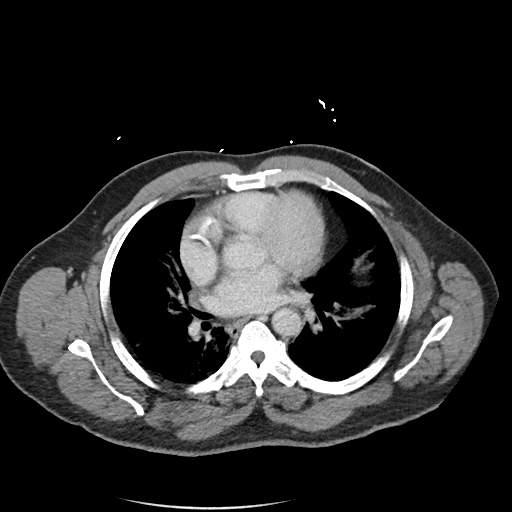

[Series 6: coronal soft tissue · coronal · 0.78mm/px · 3 of 92 slices shown]
[im 31/92  soft-tissue]
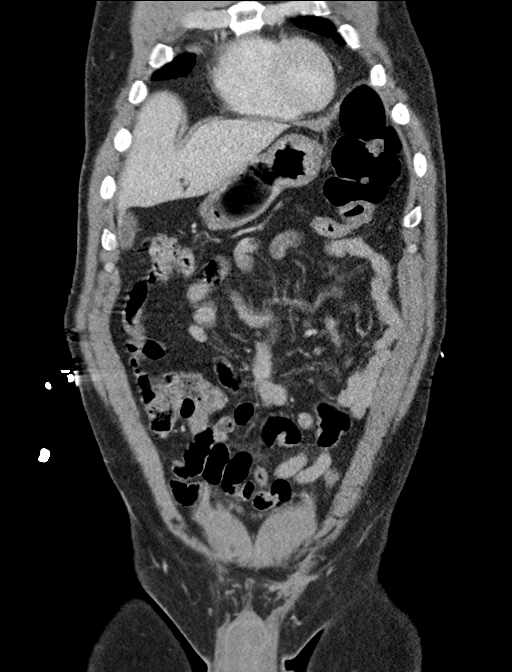
[im 41/92  soft-tissue]
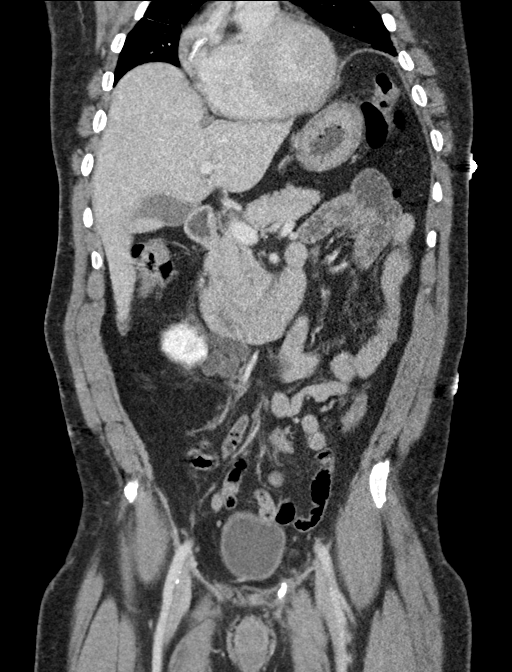
[im 51/92  soft-tissue]
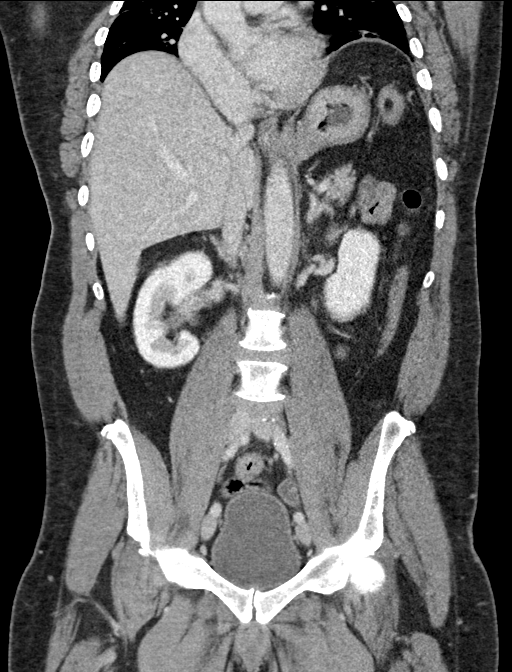

[16 of 46 positions shown; findings below may reference images not displayed]

FINDINGS: LOWER CHEST: Bibasilar atelectasis.

HEPATOBILIARY: Normal hepatic contours. There is no intra- or
extrahepatic biliary dilatation. The gallbladder is normal.

PANCREAS: Normal pancreatic contours without pancreatic ductal
dilatation or peripancreatic fluid collection.

SPLEEN: Normal.

ADRENALS/URINARY TRACT:

--Adrenal glands: Normal.

--Right kidney/ureter: No hydronephrosis, nephroureterolithiasis or
solid renal mass.

--Left kidney/ureter: No hydronephrosis, nephroureterolithiasis or
solid renal mass.

--Urinary bladder: Normal for degree of distention

STOMACH/BOWEL:

--Stomach/Duodenum: There is no hiatal hernia. The duodenal course
and caliber are normal.

--Small bowel: No dilatation or inflammation.

--Colon: No focal abnormality.

--Appendix: Normal.

VASCULAR/LYMPHATIC: Normal course and caliber of the major abdominal
vessels. No abdominal or pelvic lymphadenopathy.

REPRODUCTIVE: Enlarged prostate measures 5.5 cm in transverse
dimension.

MUSCULOSKELETAL. No bony spinal canal stenosis or focal osseous
abnormality.

OTHER: None.
IMPRESSION: 1. No abdominal or pelvic mass.
2. Prostatomegaly.

## 2020-05-22 IMAGING — DX DG CHEST 2V
2 series · 2 of 2 positions shown · non-contrast
Comparison: 06/11/2019

CLINICAL DATA: Chest pain for 3 days

EXAM:
CHEST - 2 VIEW

[chest lat]
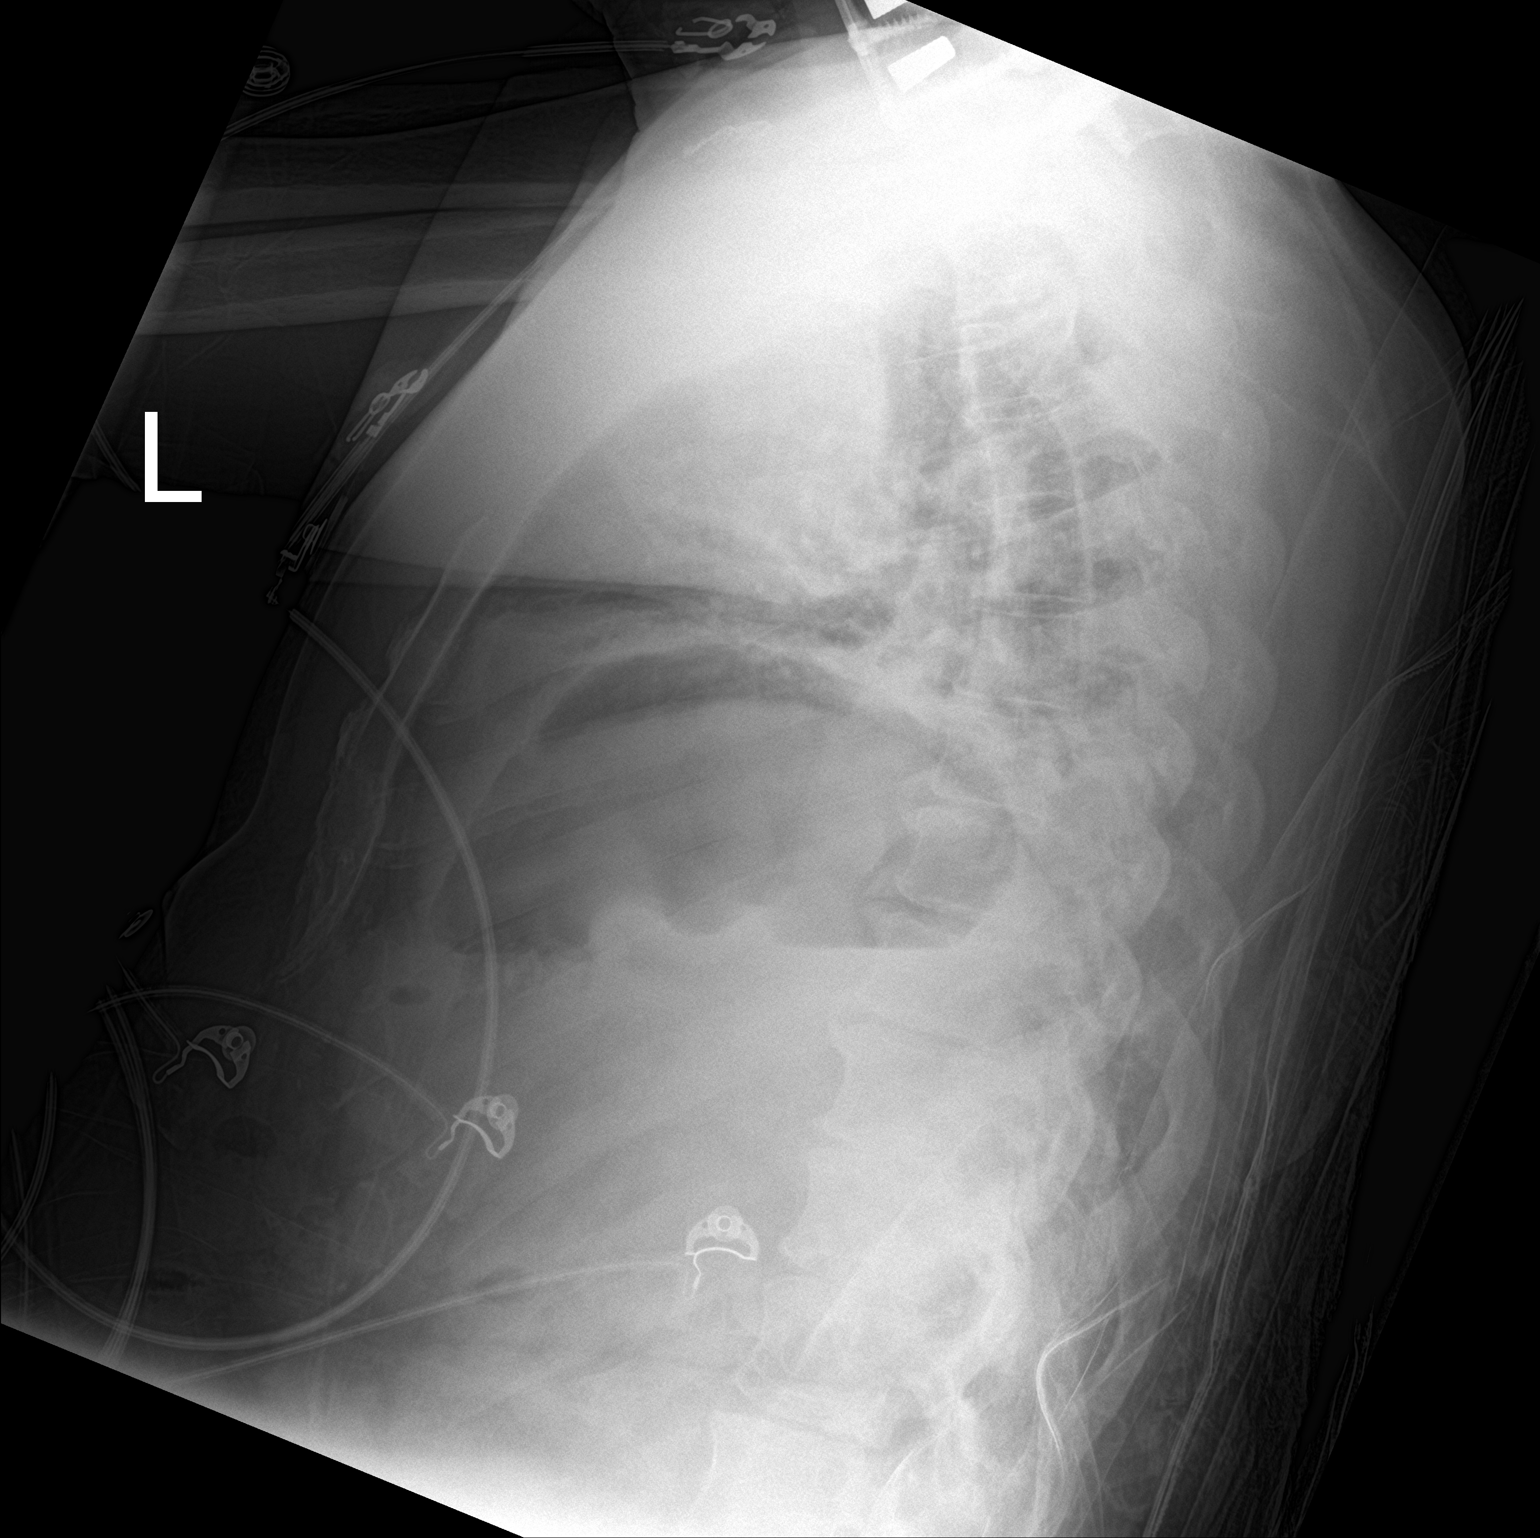

[chest ap]
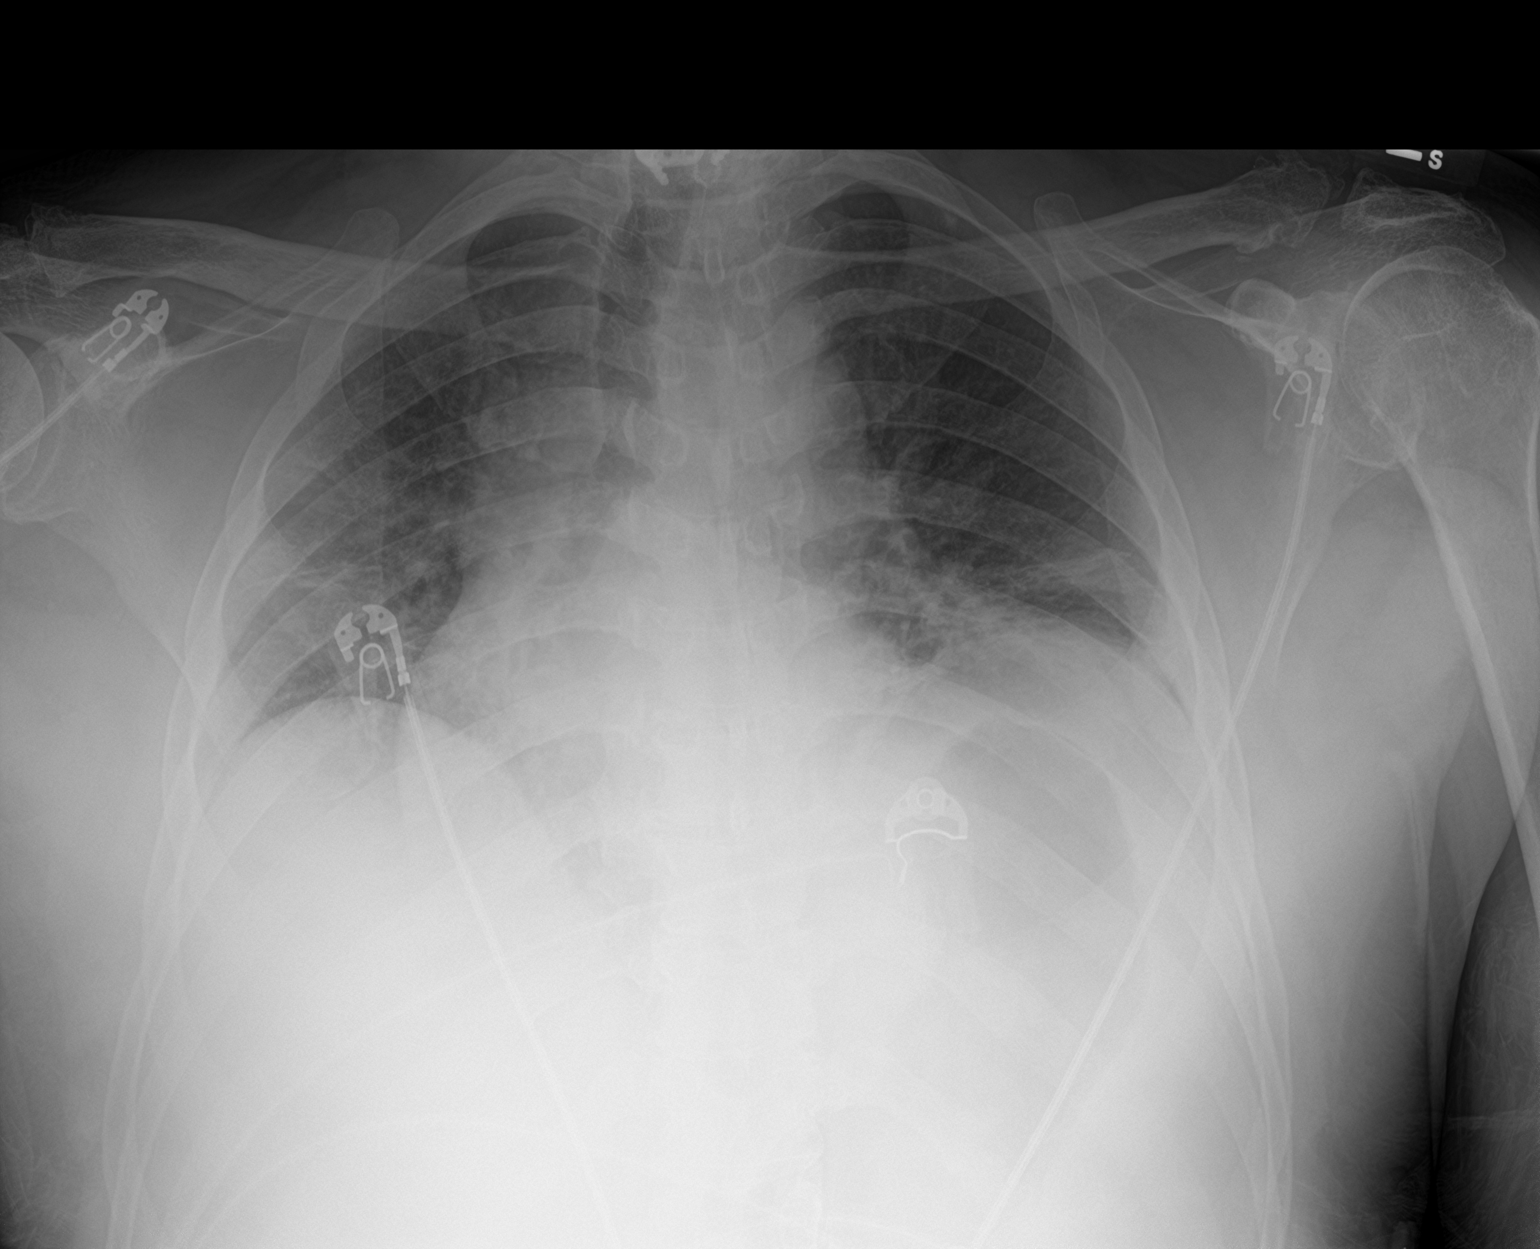

[2 of 2 positions shown; findings below may reference images not displayed]

FINDINGS: Lung volumes are low. Cardiomediastinal contours are poorly
evaluated but appear grossly unchanged from prior. Streaky bibasilar
opacities. No large pleural fluid collection. No pneumothorax.
IMPRESSION: Low lung volumes with streaky bibasilar opacities likely
representing atelectasis. A superimposed infectious process would be
difficult to exclude.

## 2020-05-23 IMAGING — US US PELVIS LIMITED
1 series · 14 of 16 positions shown · non-contrast
Comparison: Abdomen and pelvis CT dated 04/27/2018.

CLINICAL DATA: Left groin pain for the past 4 months. Clinical
concern for an inguinal hernia.

EXAM:
LIMITED ULTRASOUND OF PELVIS
TECHNIQUE: Limited transabdominal ultrasound examination of the pelvis was
performed.

[Series 1: us pelvis limited · 16 acquisitions, 14 frames shown]
[im 1/16]
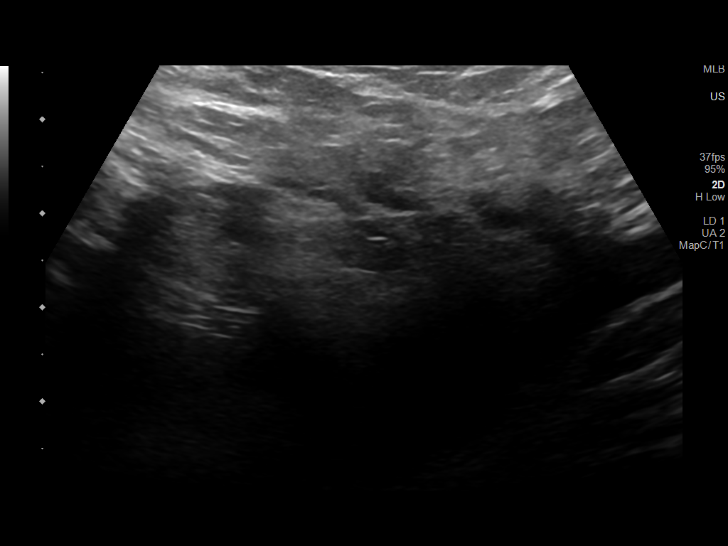
[im 2/16]
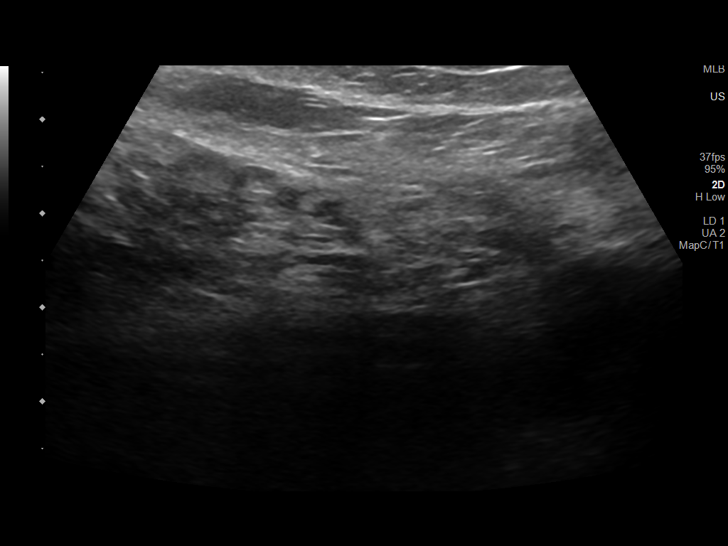
[im 3/16]
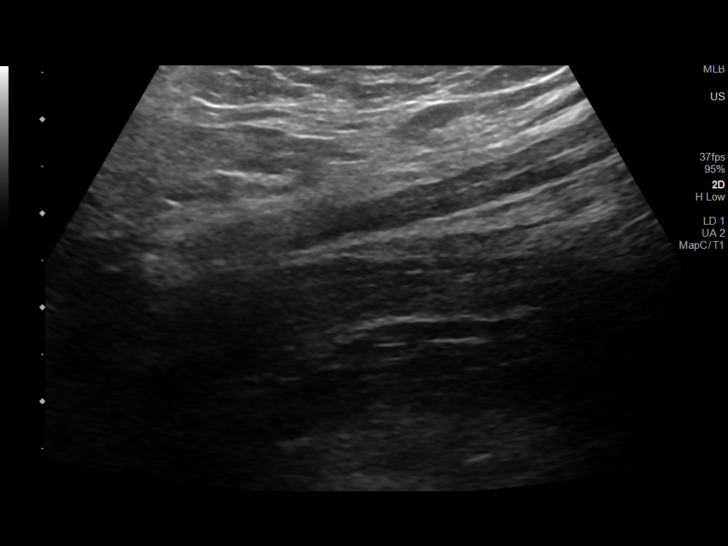
[im 5/16]
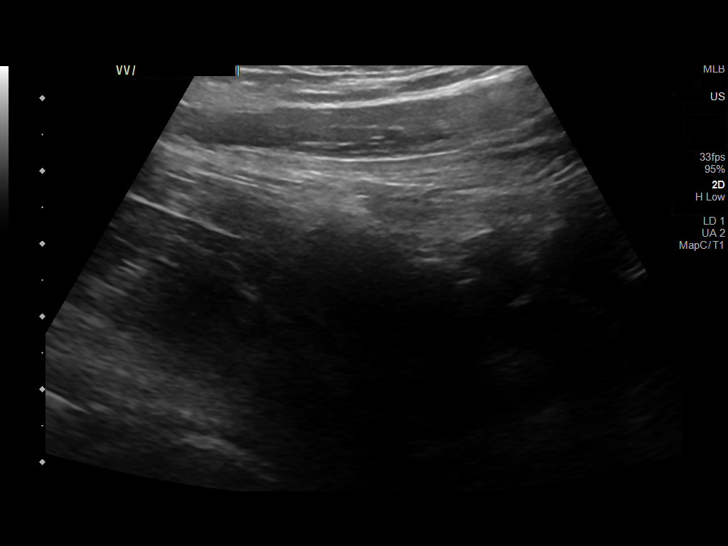
[im 6/16]
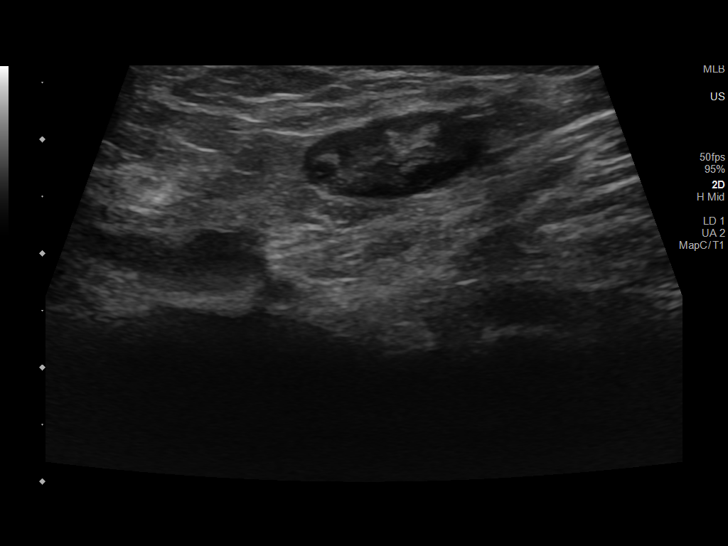
[im 7/16]
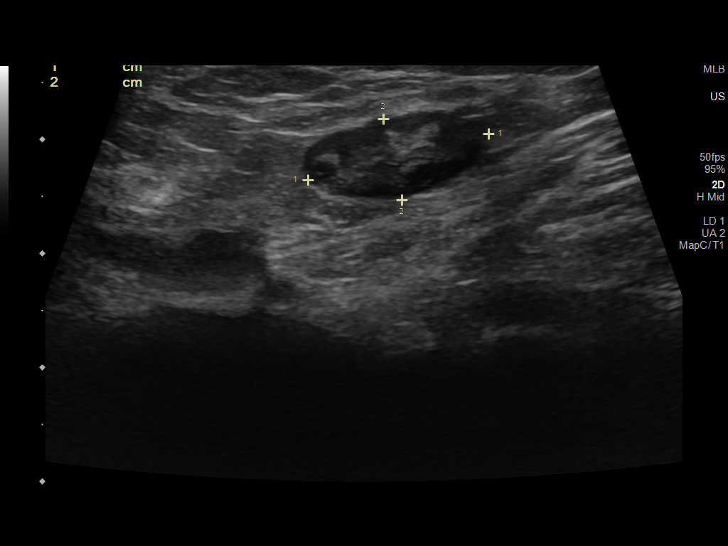
[im 8/16]
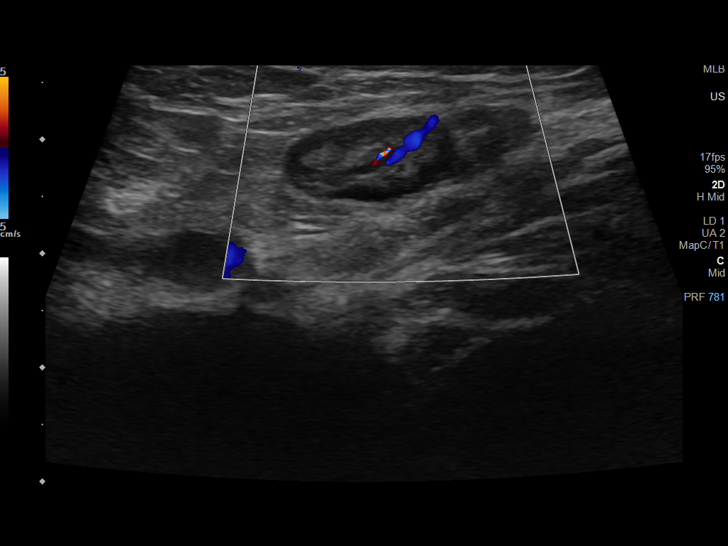
[im 9/16]
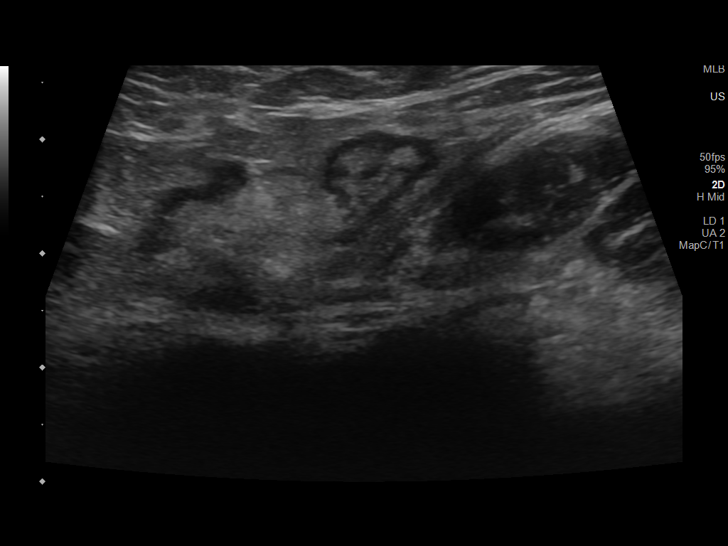
[im 10/16]
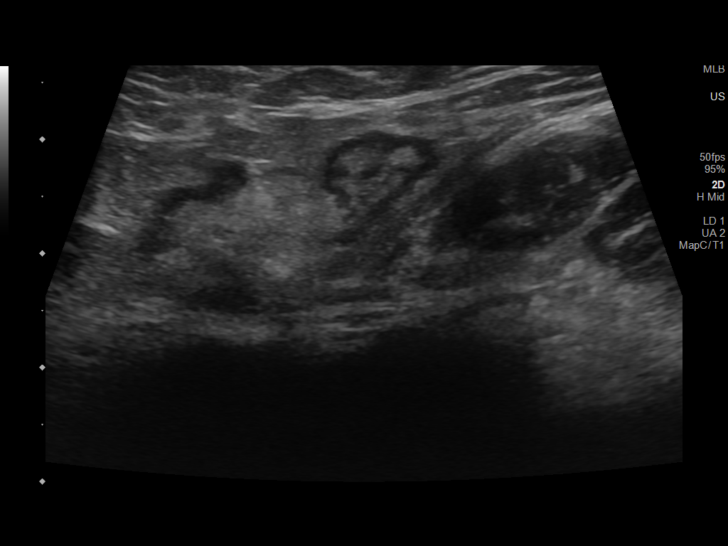
[im 11/16]
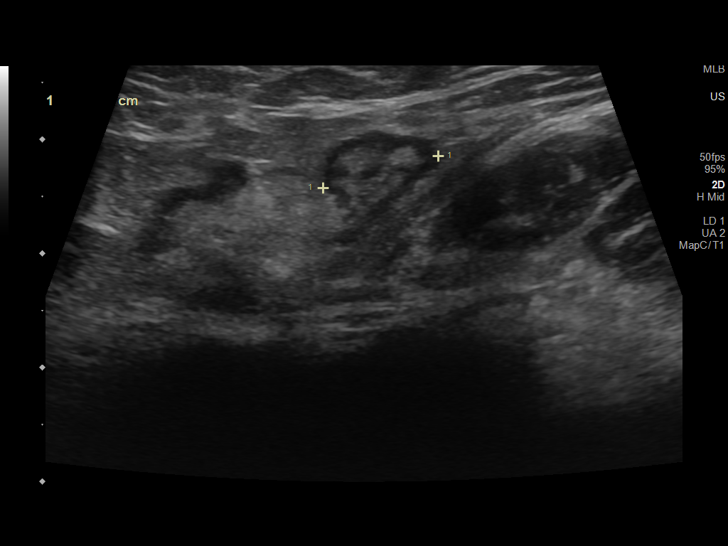
[im 13/16]
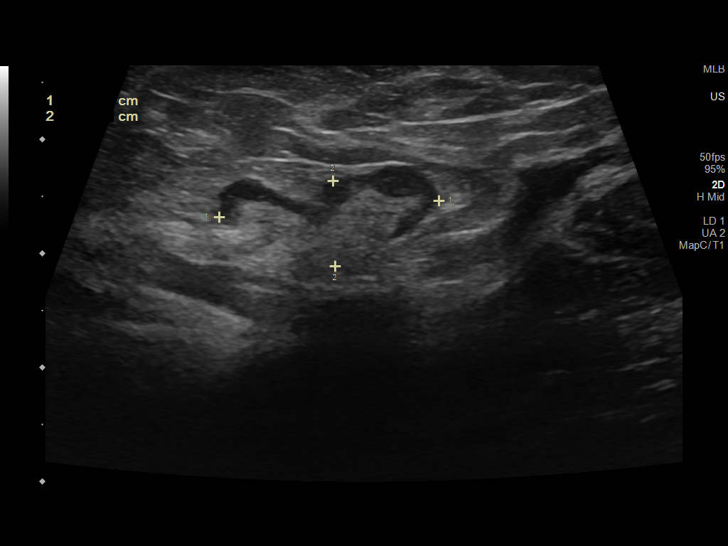
[im 14/16]
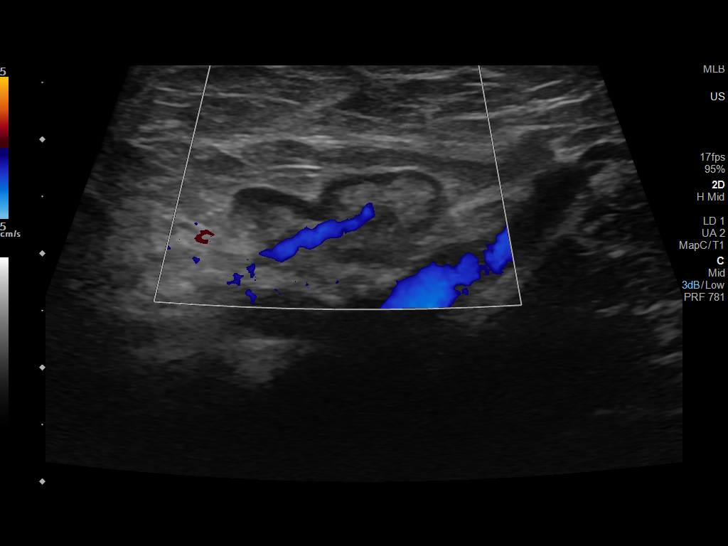
[im 15/16]
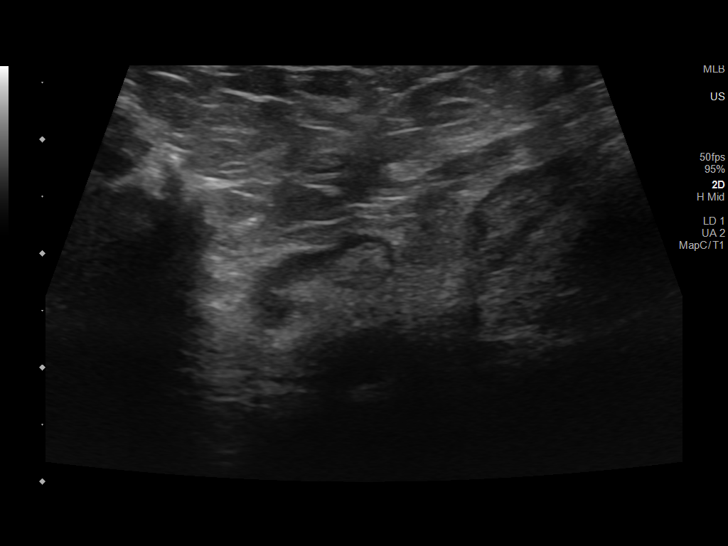
[im 16/16]
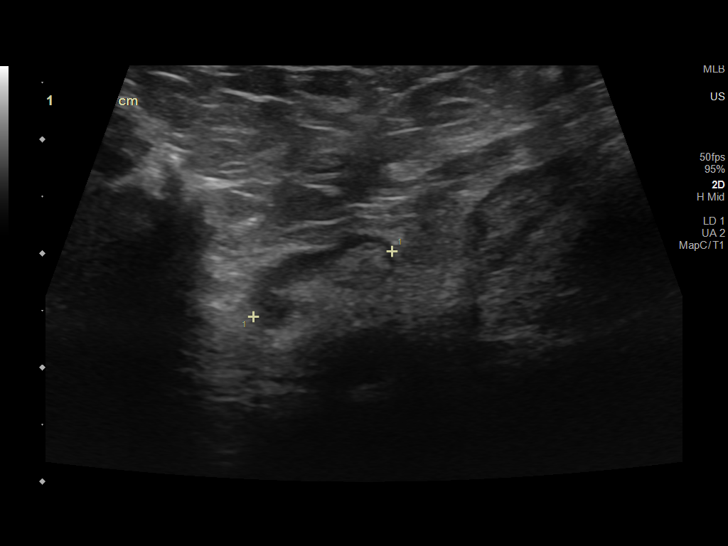

[14 of 16 positions shown; findings below may reference images not displayed]

FINDINGS: Normal appearing left inguinal lymph nodes.  No hernia seen.
IMPRESSION: Normal examination.  No inguinal hernia visualized.

## 2020-06-28 IMAGING — DX DG CHEST 1V PORT
1 series · 1 of 1 positions shown · non-contrast
Comparison: Portable exam 1437 hours compared to 08/19/2019

CLINICAL DATA: Shortness of breath, headache, weakness, cough, body
aches, fever, history CHF, COPD, asthma

EXAM:
PORTABLE CHEST 1 VIEW

[chest]
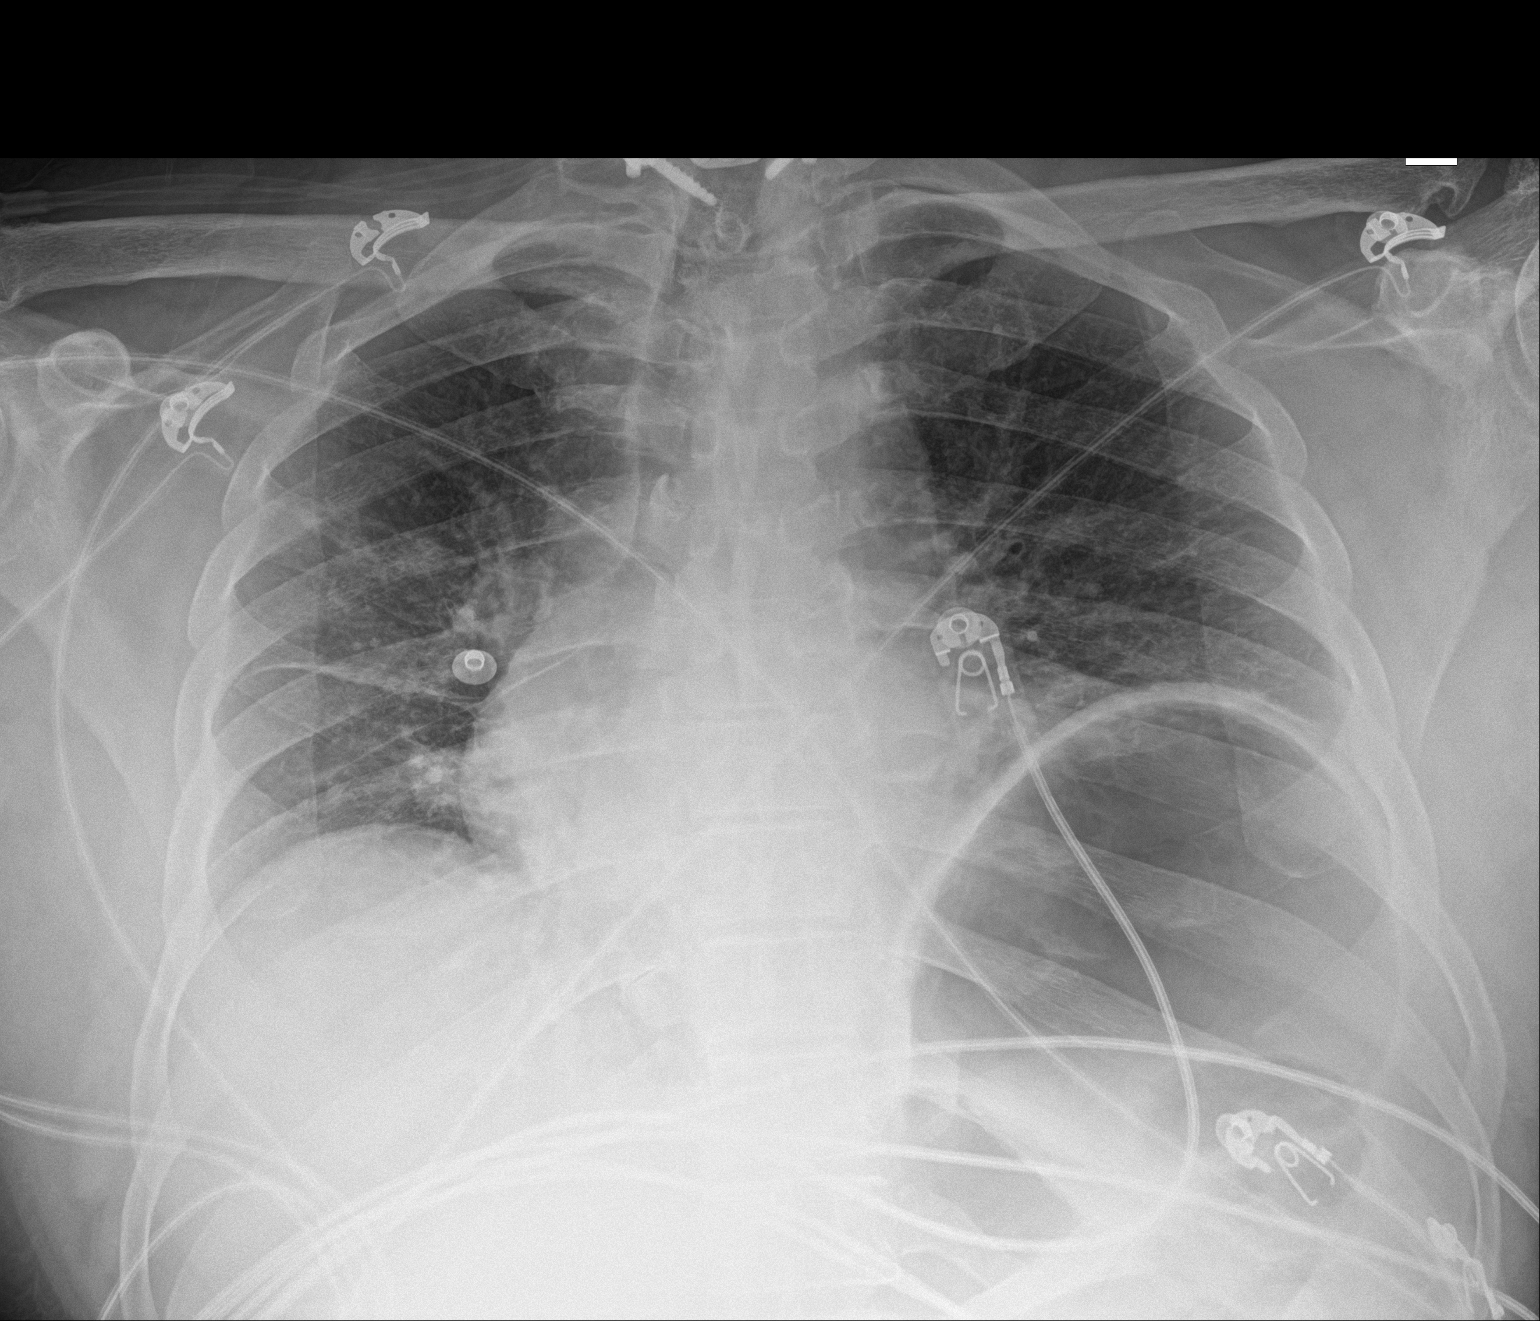

[1 of 1 positions shown; findings below may reference images not displayed]

FINDINGS: Enlargement of cardiac silhouette.

Mediastinal contours and pulmonary vascularity normal.

Elevation of LEFT diaphragm.

Bibasilar atelectasis.

No definite infiltrate, pleural effusion or pneumothorax.

Gaseous distention of stomach.

Prior cervical fusion.
IMPRESSION: Low lung volumes with bibasilar atelectasis.

Gaseous distention of stomach.

## 2020-06-28 IMAGING — CT CT HEAD W/O CM
3 series · 14 of 47 positions shown, 16 images · non-contrast
Comparison: August 19, 2019

CLINICAL DATA: Worst headache of life.

EXAM:
CT HEAD WITHOUT CONTRAST
TECHNIQUE: Contiguous axial images were obtained from the base of the skull
through the vertex without intravenous contrast.

[Series 3: head 5.0 h30s · axial · 0.43mm/px · z∈[+832,+987]mm · 8 of 37 slices shown, 10 images]
[im 3/37  brain]
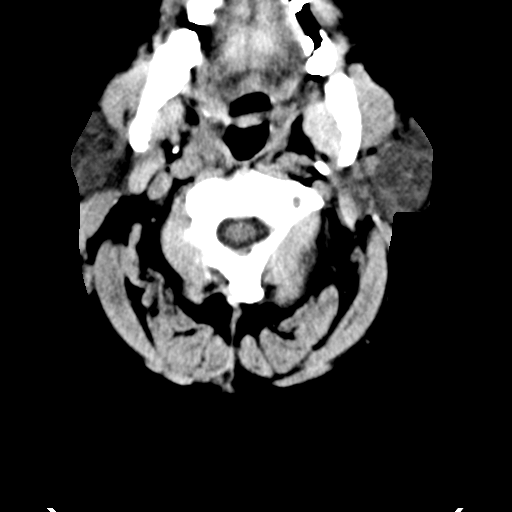
[im 3/37  bone]
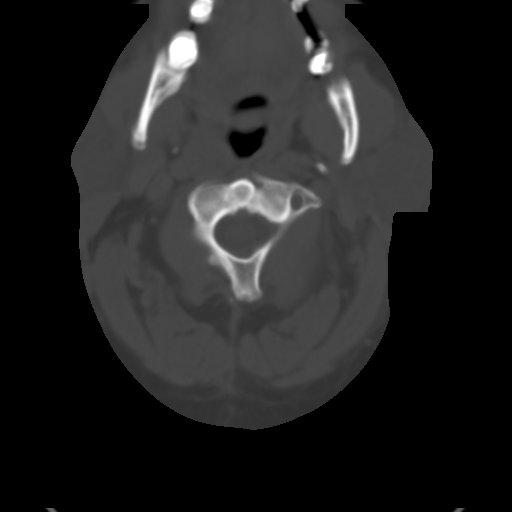
[im 8/37  brain]
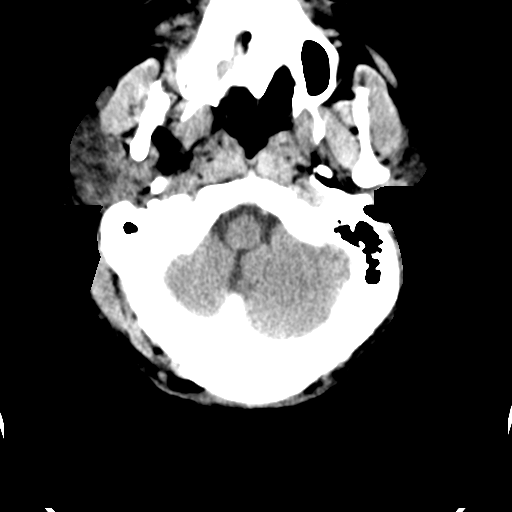
[im 12/37  brain]
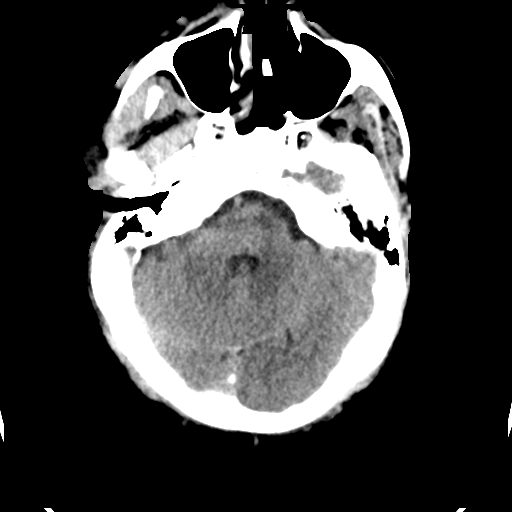
[im 17/37  brain]
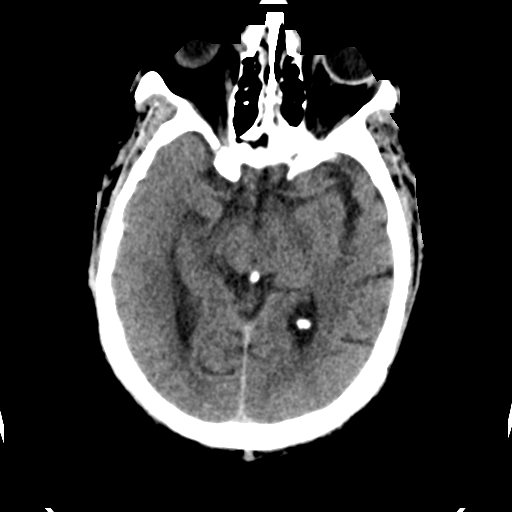
[im 20/37  brain]
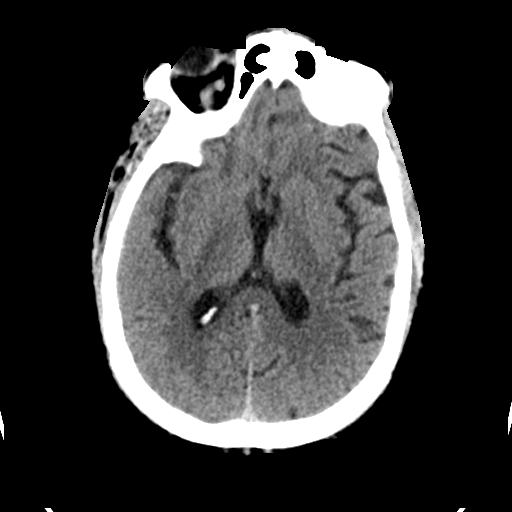
[im 20/37  bone]
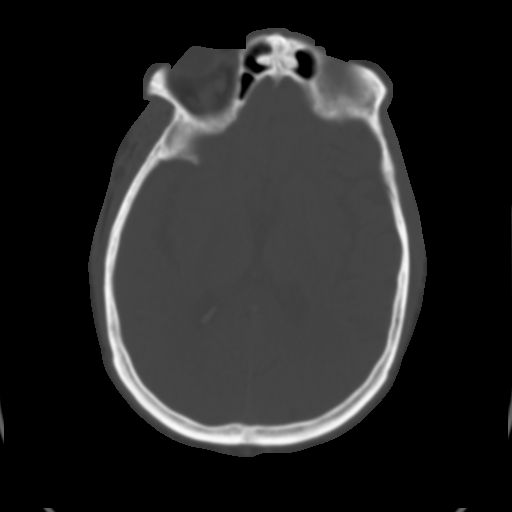
[im 25/37  brain]
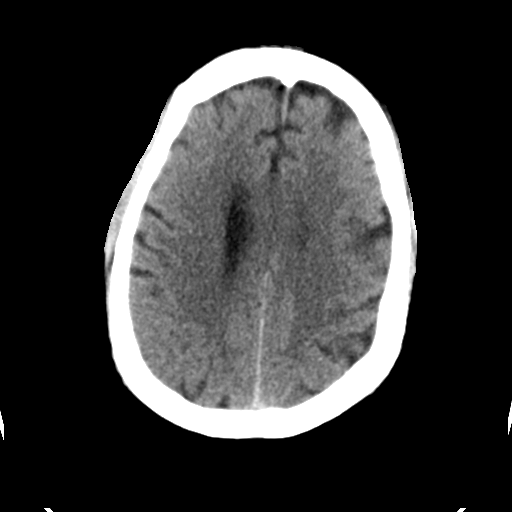
[im 29/37  brain]
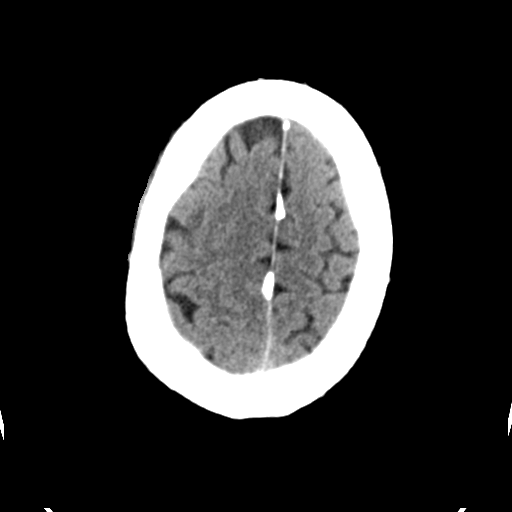
[im 34/37  brain]
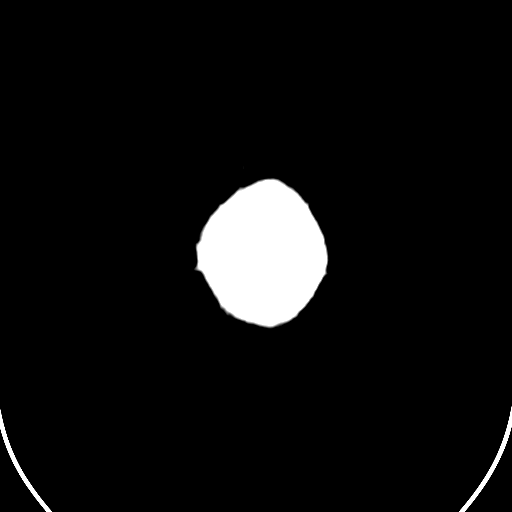

[Series 5: head 3.0 mpr cor · coronal · 0.43mm/px · 3 of 95 slices shown]
[im 32/95  brain]
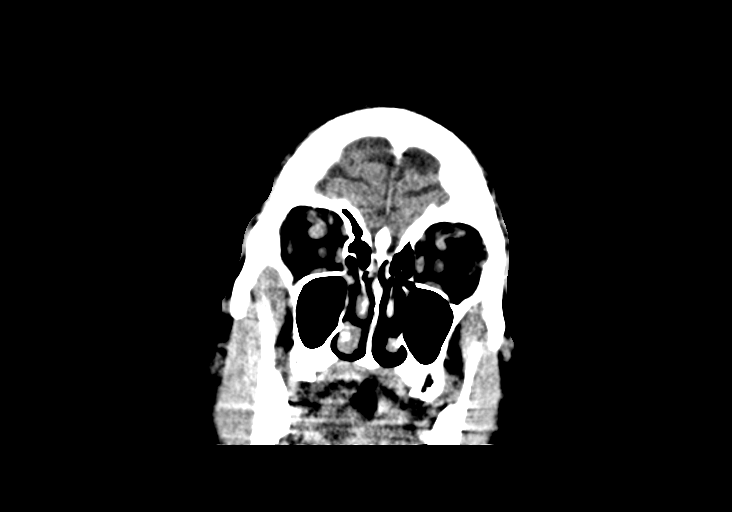
[im 42/95  brain]
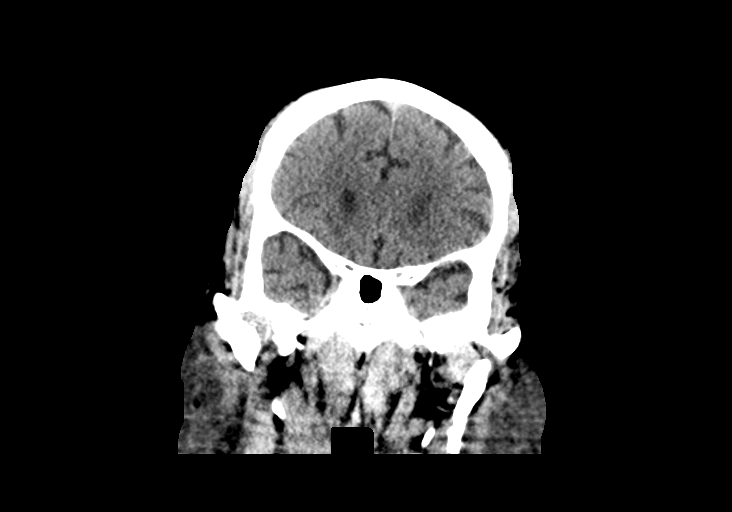
[im 53/95  brain]
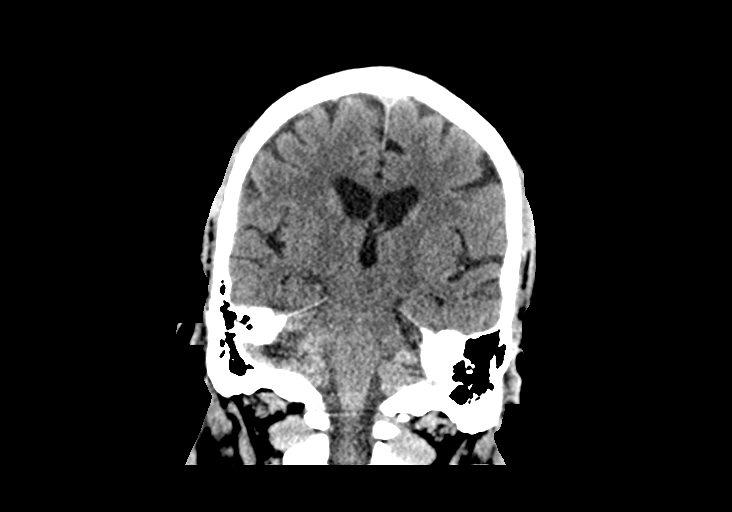

[Series 6: head 3.0 mpr sag · sagittal · 0.45mm/px · 3 of 67 slices shown]
[im 23/67  brain]
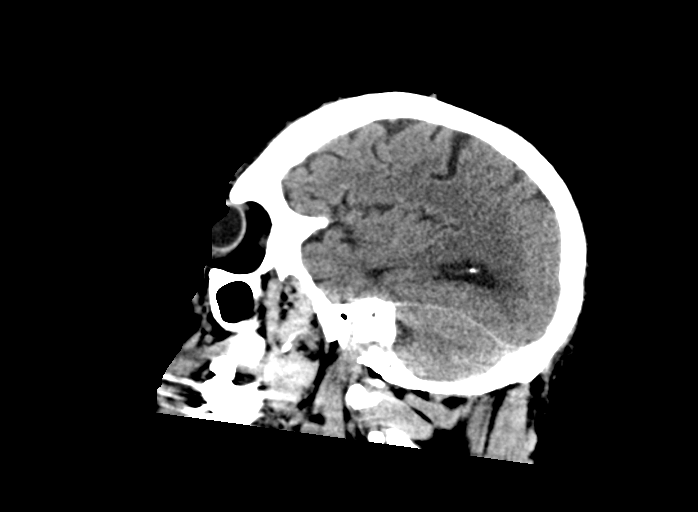
[im 34/67  brain]
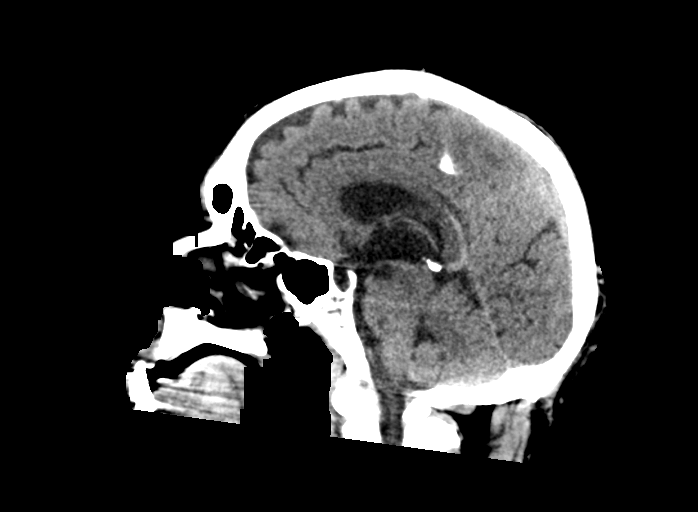
[im 45/67  brain]
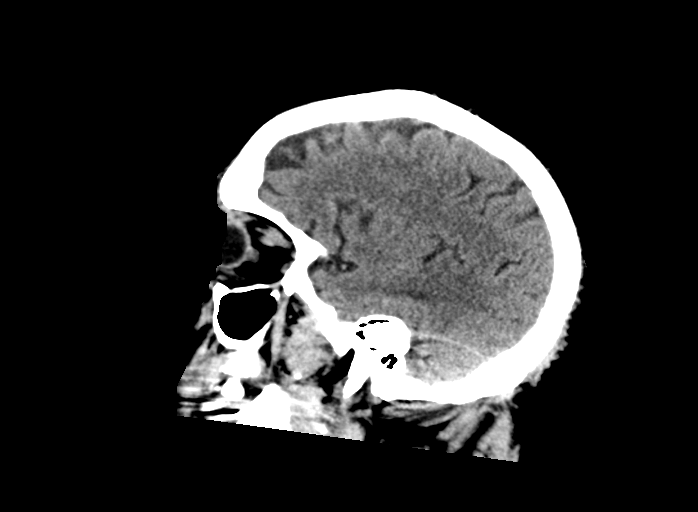

[14 of 47 positions shown; findings below may reference images not displayed]

FINDINGS: Brain: No subdural, epidural, or subarachnoid hemorrhage. Ventricles
and sulci are stable. No acute cortical ischemia or infarct. No mass
effect or midline shift. Cerebellum, brainstem, and basal cisterns
are normal.

Vascular: No hyperdense vessel or unexpected calcification.

Skull: Normal. Negative for fracture or focal lesion.

Sinuses/Orbits: No acute finding.

Other: None.
IMPRESSION: No cause for headache identified. No acute intracranial
abnormalities.

## 2020-07-05 IMAGING — CT CT HEAD W/O CM
3 series · 14 of 47 positions shown, 16 images · non-contrast
Comparison: 09/25/[address] head CT. Brain MRI 12/25/2018

CLINICAL DATA: 63-year-old male with 1 week of headache. Two days
of shortness of breath. Positive for [id number].

EXAM:
CT HEAD WITHOUT CONTRAST
TECHNIQUE: Contiguous axial images were obtained from the base of the skull
through the vertex without intravenous contrast.

[Series 2: head wo · axial · 0.47mm/px · z∈[-165,-40]mm · 8 of 30 slices shown, 10 images]
[im 3/30  brain]
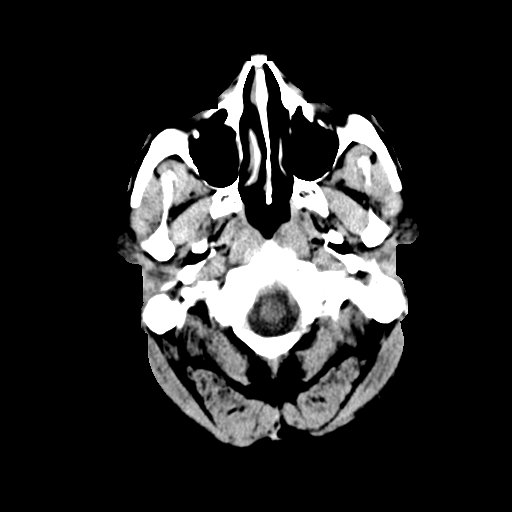
[im 3/30  bone]
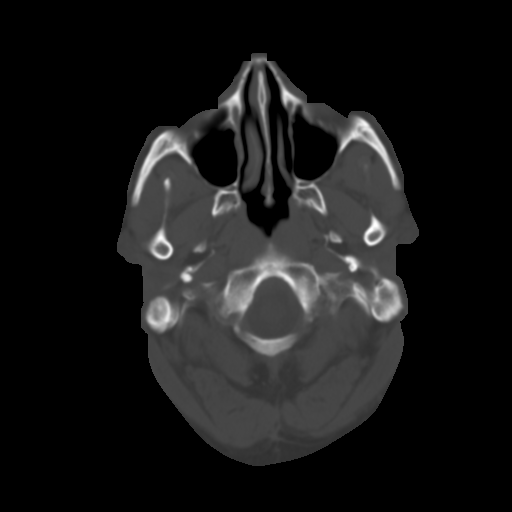
[im 7/30  brain]
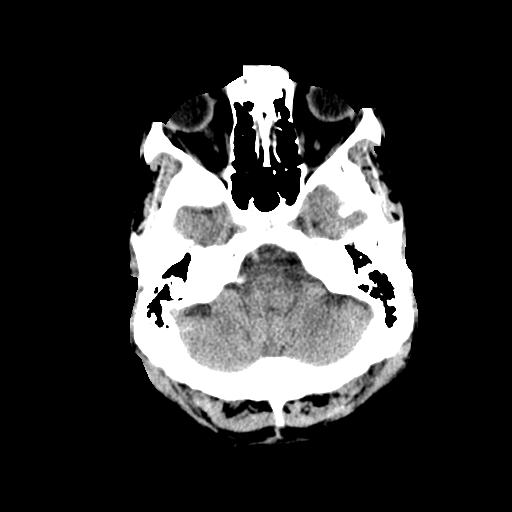
[im 10/30  brain]
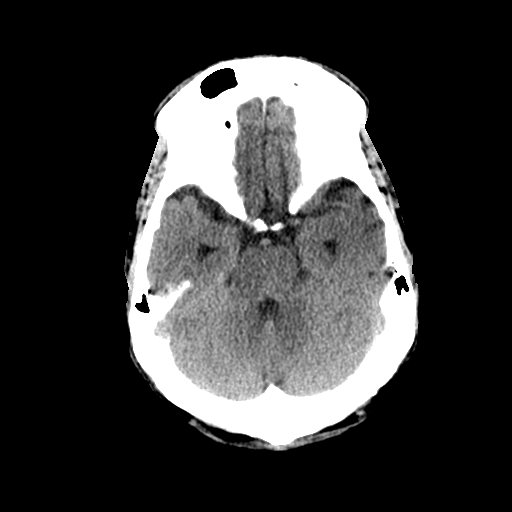
[im 14/30  brain]
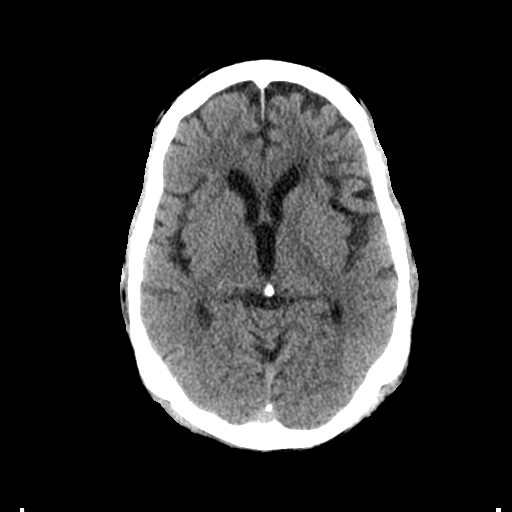
[im 17/30  brain]
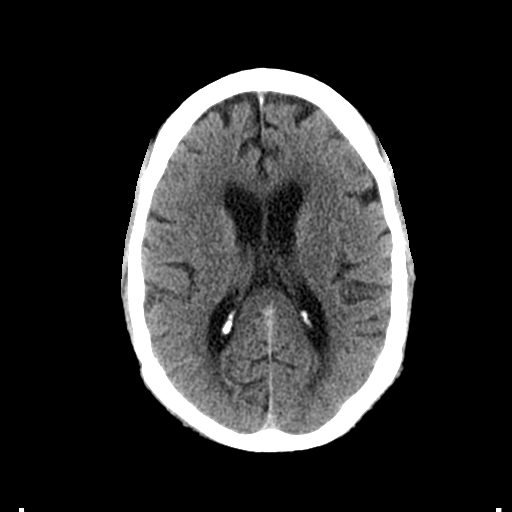
[im 17/30  bone]
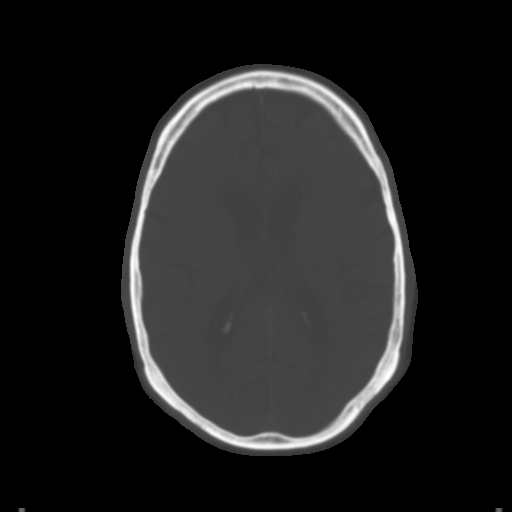
[im 21/30  brain]
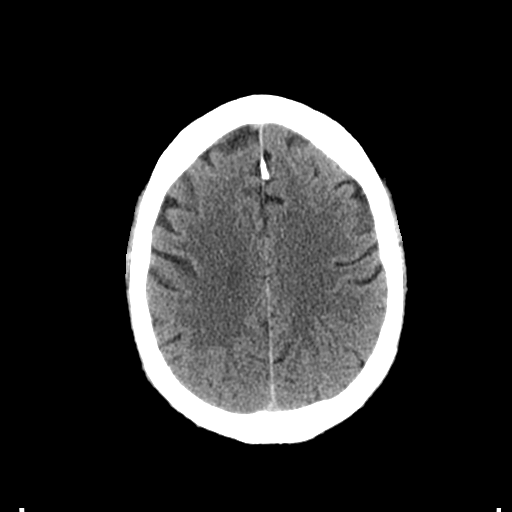
[im 24/30  brain]
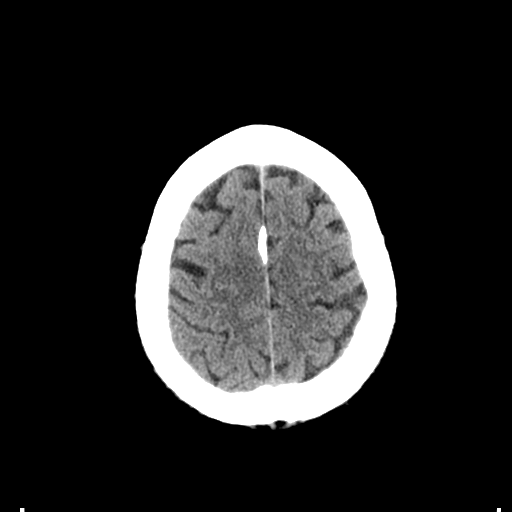
[im 28/30  brain]
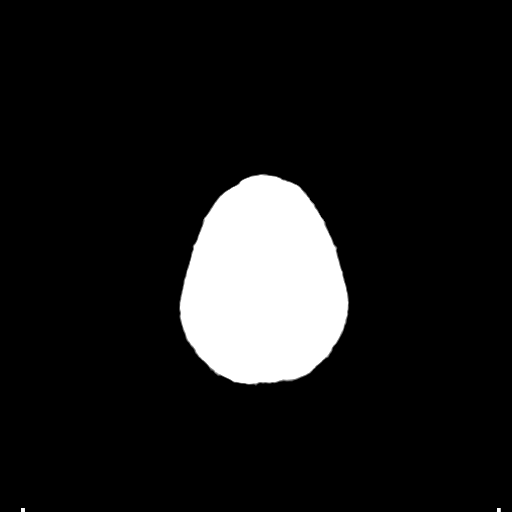

[Series 4: coronal soft tissue · coronal · 0.35mm/px · 3 of 65 slices shown]
[im 22/65  brain]
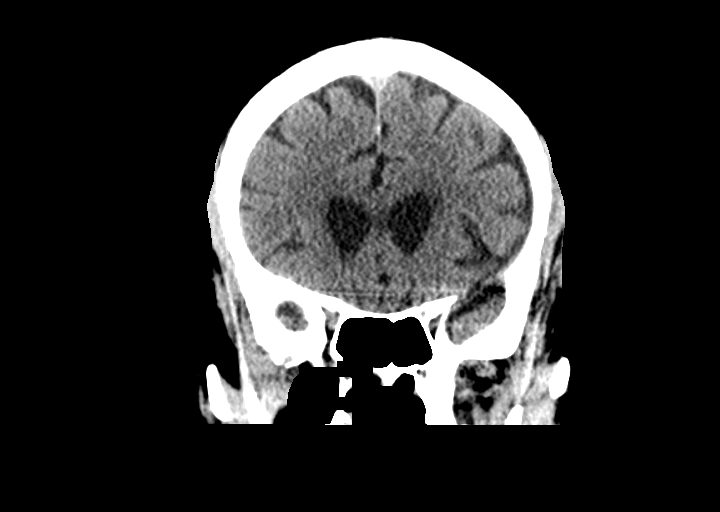
[im 29/65  brain]
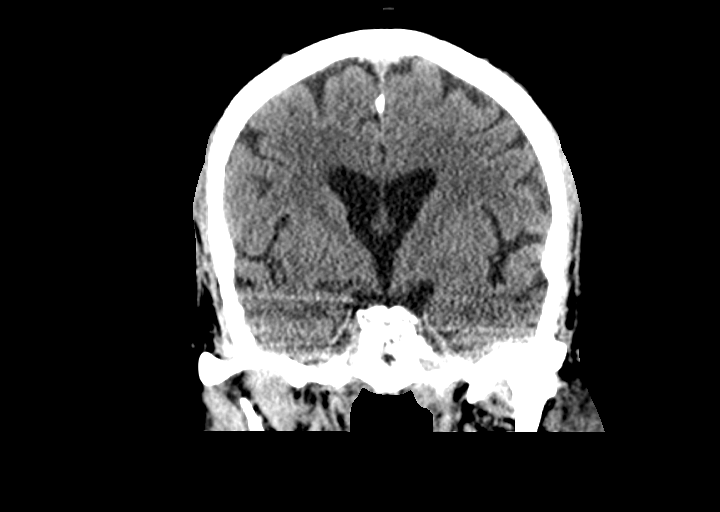
[im 36/65  brain]
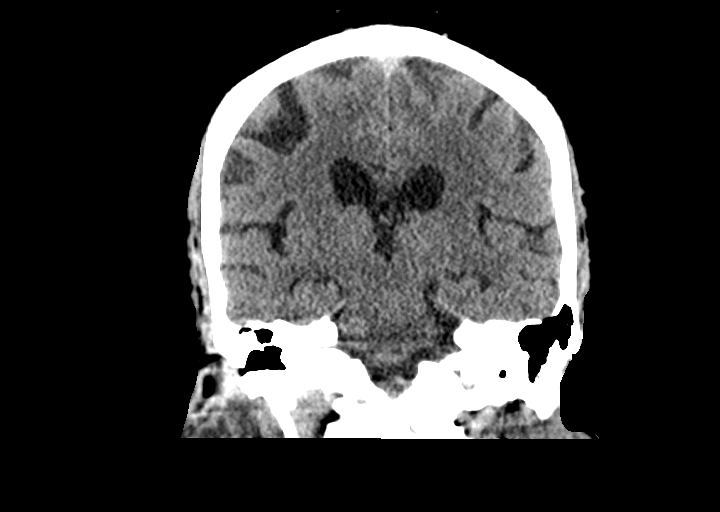

[Series 5: sagittal soft tissue · sagittal · 0.37mm/px · 3 of 50 slices shown]
[im 17/50  brain]
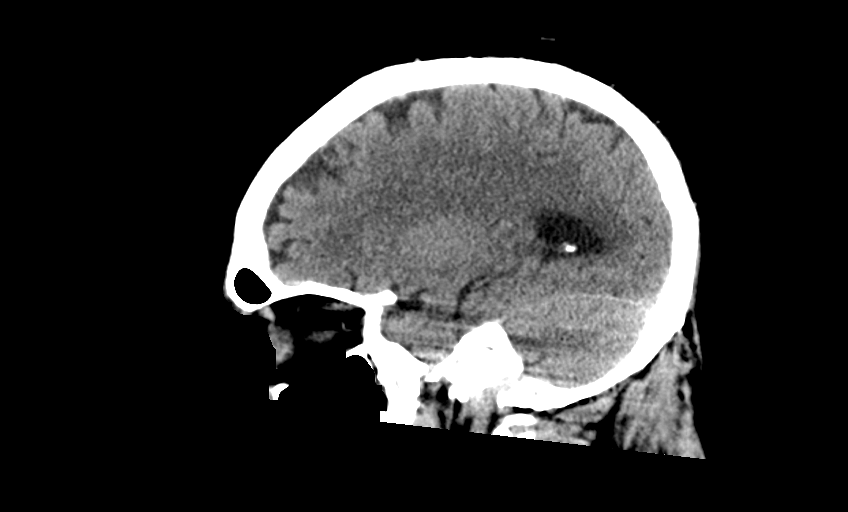
[im 25/50  brain]
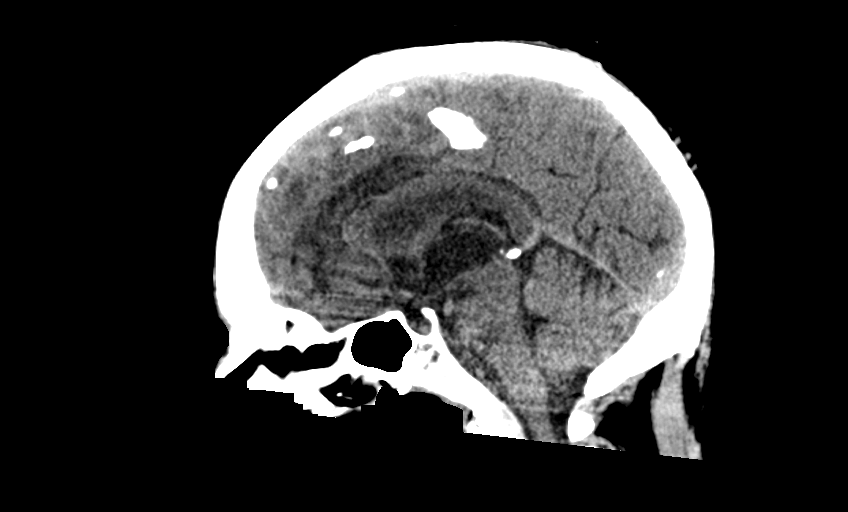
[im 33/50  brain]
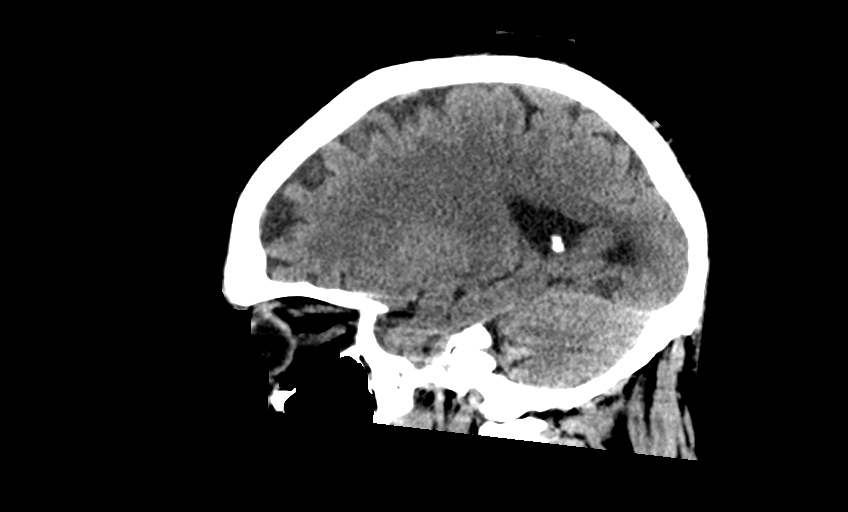

[14 of 47 positions shown; findings below may reference images not displayed]

FINDINGS: Brain: Stable cerebral volume. No midline shift, ventriculomegaly,
mass effect, evidence of mass lesion, intracranial hemorrhage or
evidence of cortically based acute infarction. Stable gray-white
matter differentiation with mild periventricular white matter
hypodensity, stable compared to the [REDACTED] MRI.

Vascular: Mild Calcified atherosclerosis at the skull base. No
suspicious intracranial vascular hyperdensity.

Skull: Negative.

Sinuses/Orbits: Visualized paranasal sinuses and mastoids are stable
and well pneumatized.

Other: Mildly Disconjugate gaze, otherwise negative orbits. No acute
scalp soft tissue finding.
IMPRESSION: Stable largely negative for age non contrast CT appearance of the
brain. No acute intracranial abnormality.

## 2020-07-07 IMAGING — US US ABDOMEN LIMITED
1 series · 14 of 25 positions shown · non-contrast
Comparison: Hepatobiliary scan April 28, 2018 and CT abdomen and
pelvis April 27, 2018.

CLINICAL DATA: RIGHT upper quadrant pain.

EXAM:
ULTRASOUND ABDOMEN LIMITED RIGHT UPPER QUADRANT

[Series 1: us abdomen limited · 0.21mm/px · 14 of 44 slices shown]
[im 1/44]
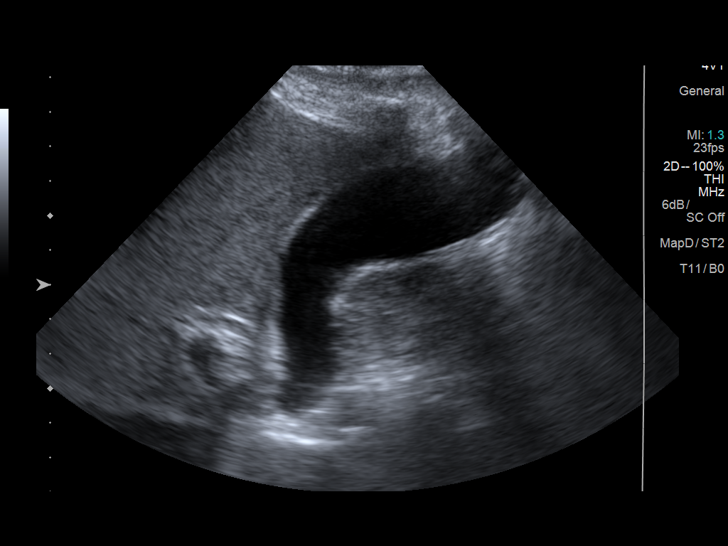
[im 4/44]
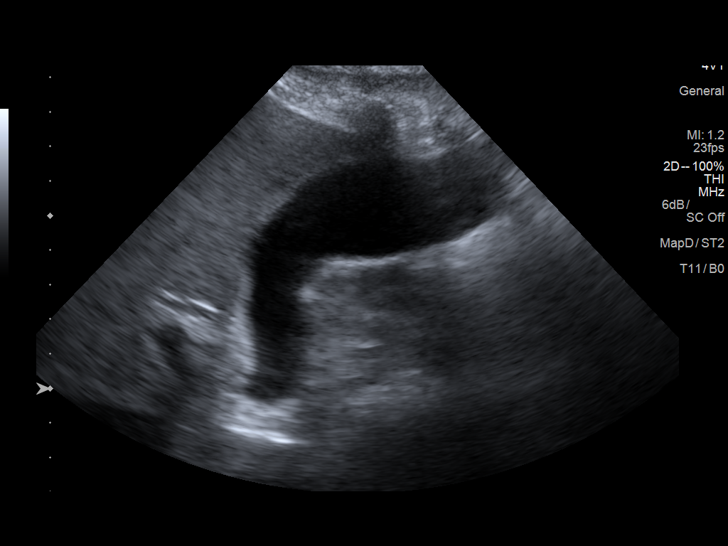
[im 8/44]
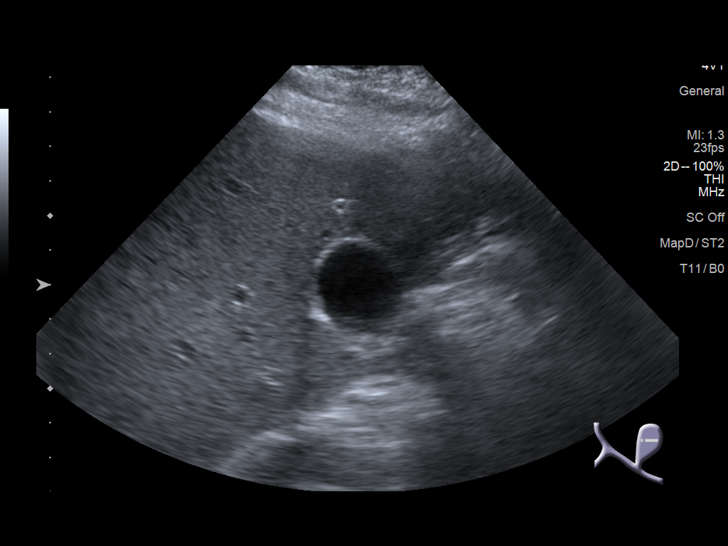
[im 11/44]
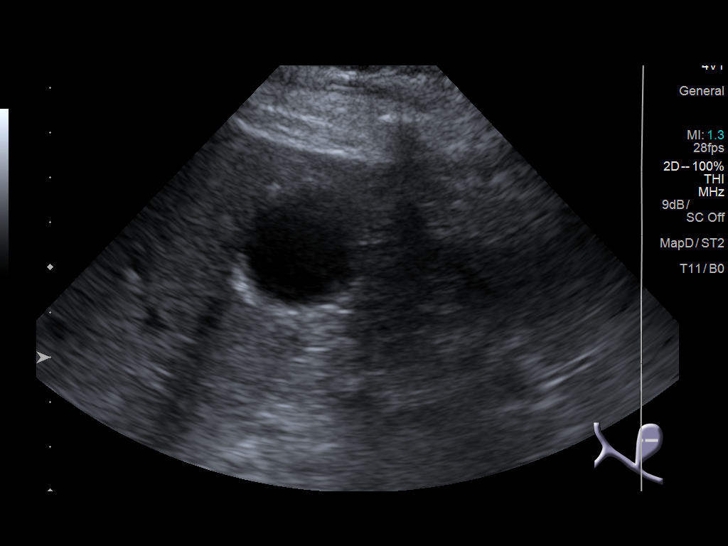
[im 15/44]
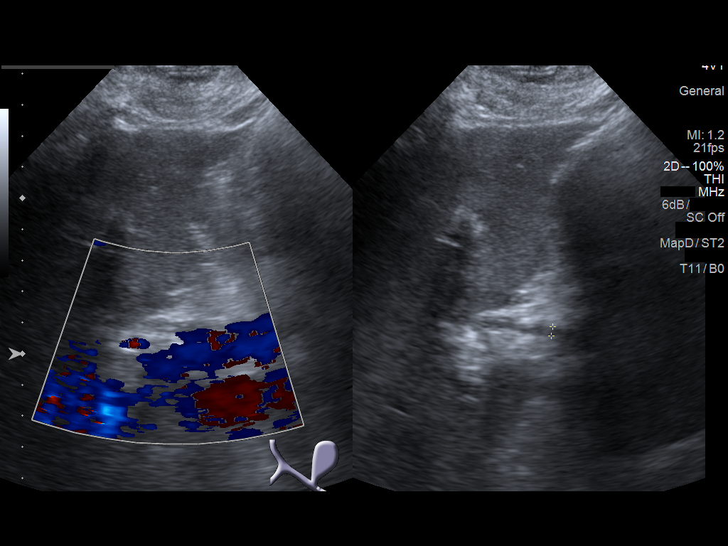
[im 17/44]
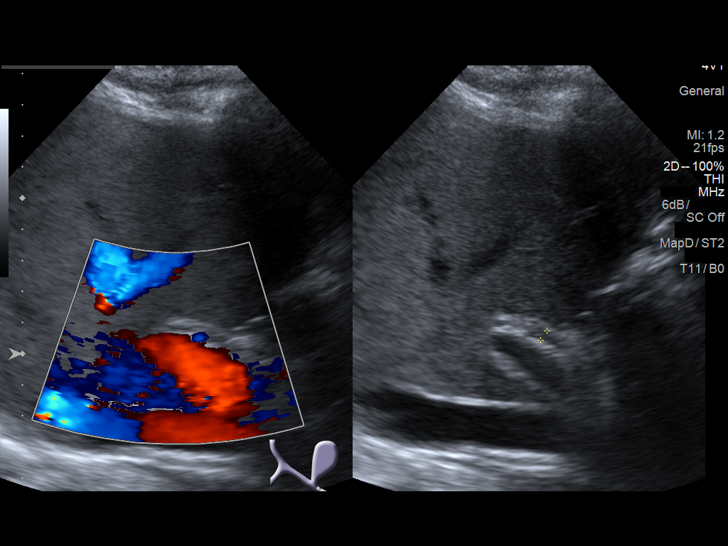
[im 20/44]
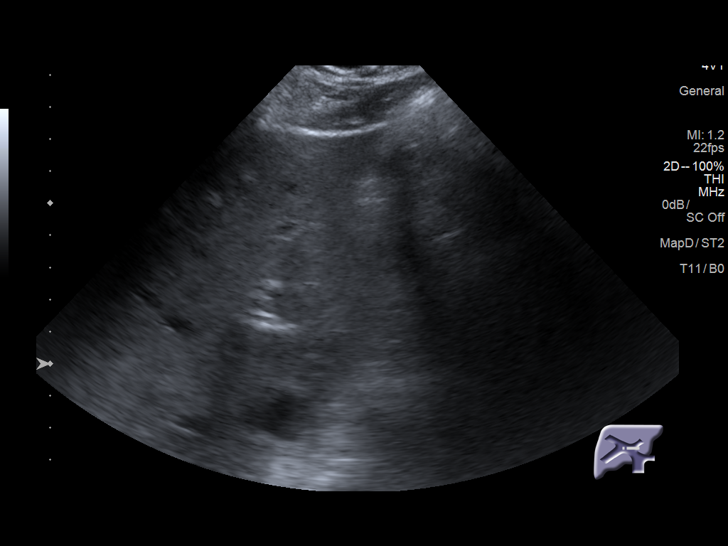
[im 24/44]
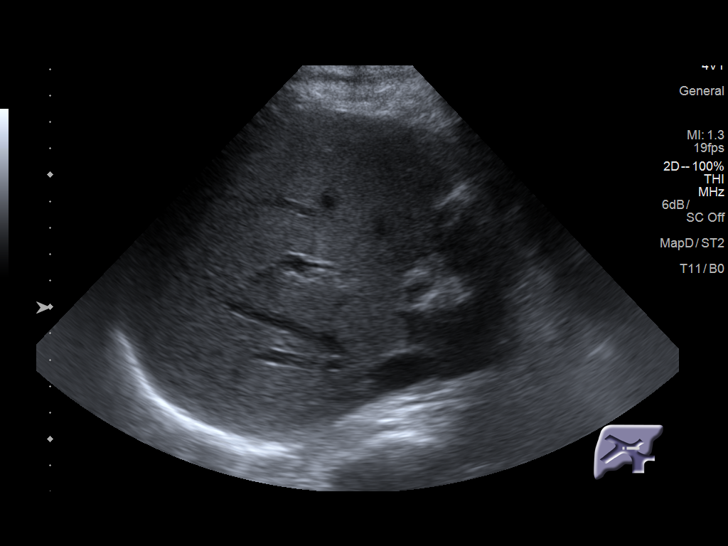
[im 27/44]
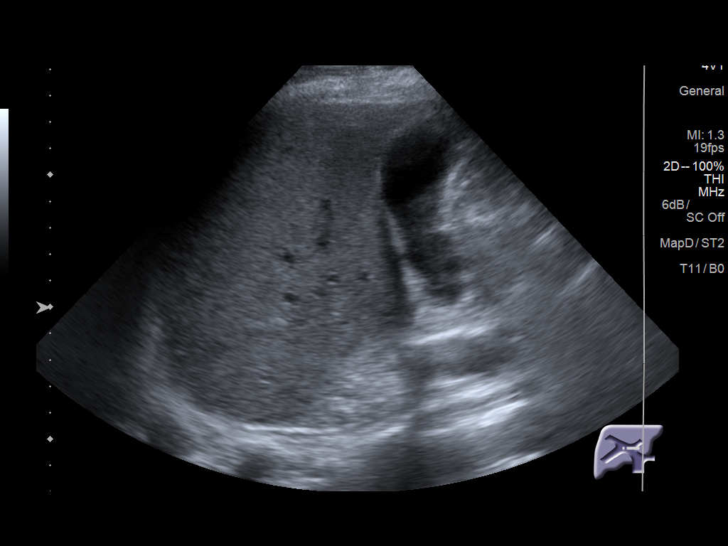
[im 29/44]
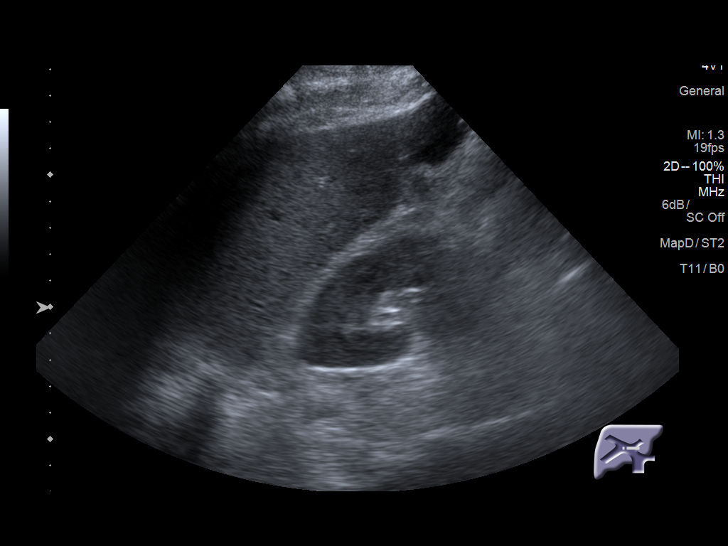
[im 33/44]
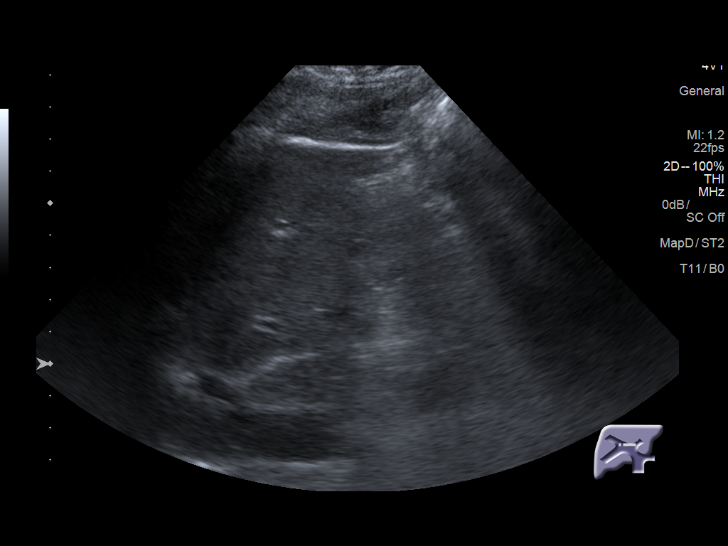
[im 36/44]
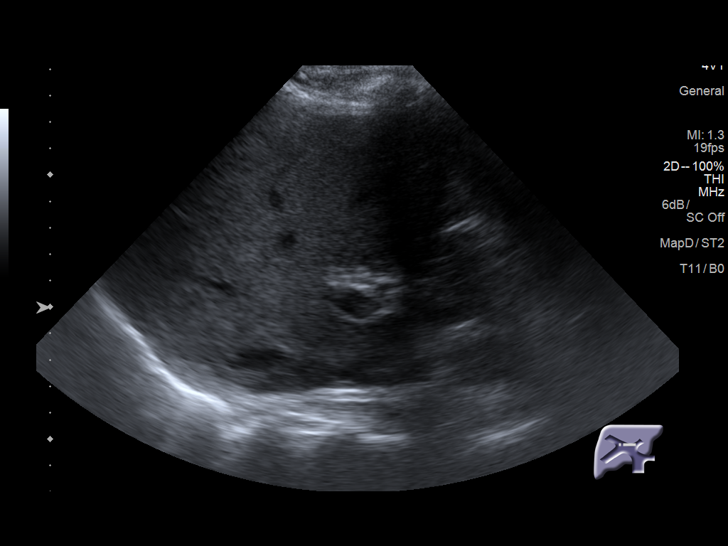
[im 40/44]
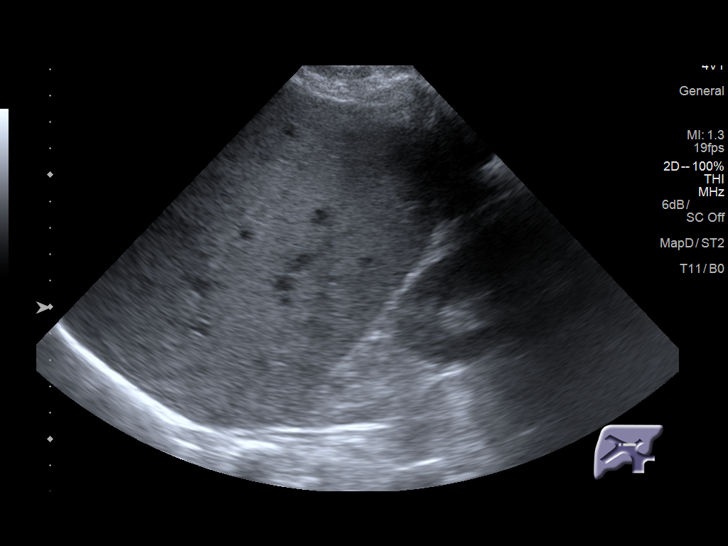
[im 44/44]
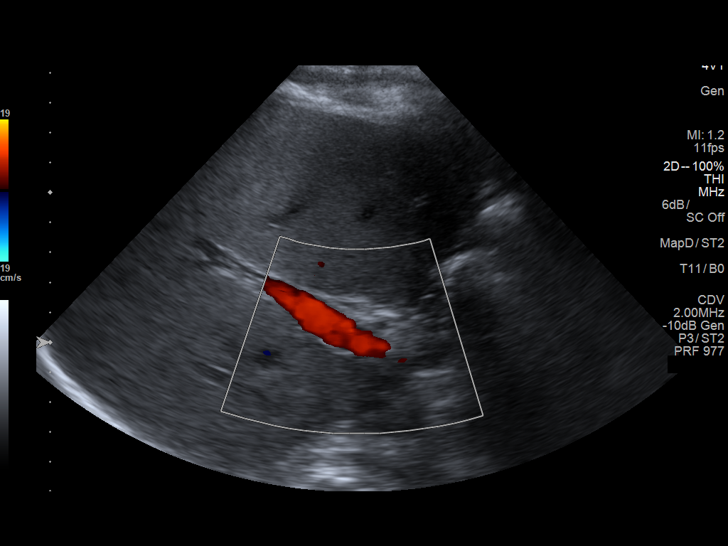

[14 of 25 positions shown; findings below may reference images not displayed]

FINDINGS: Gallbladder:

No gallstones or wall thickening visualized. No sonographic Murphy
sign noted by sonographer.

Common bile duct:

Diameter: 3 mm.

Liver:

No focal lesion identified. Within normal limits in parenchymal
echogenicity. Portal vein is patent on color Doppler imaging with
normal direction of blood flow towards the liver.
IMPRESSION: Negative RIGHT upper quadrant ultrasound.

## 2020-07-13 IMAGING — DX DG CHEST 1V PORT
1 series · 1 of 1 positions shown · non-contrast
Comparison: Eight days ago

CLINICAL DATA: [id number] pneumonia

EXAM:
PORTABLE CHEST 1 VIEW

[chest]
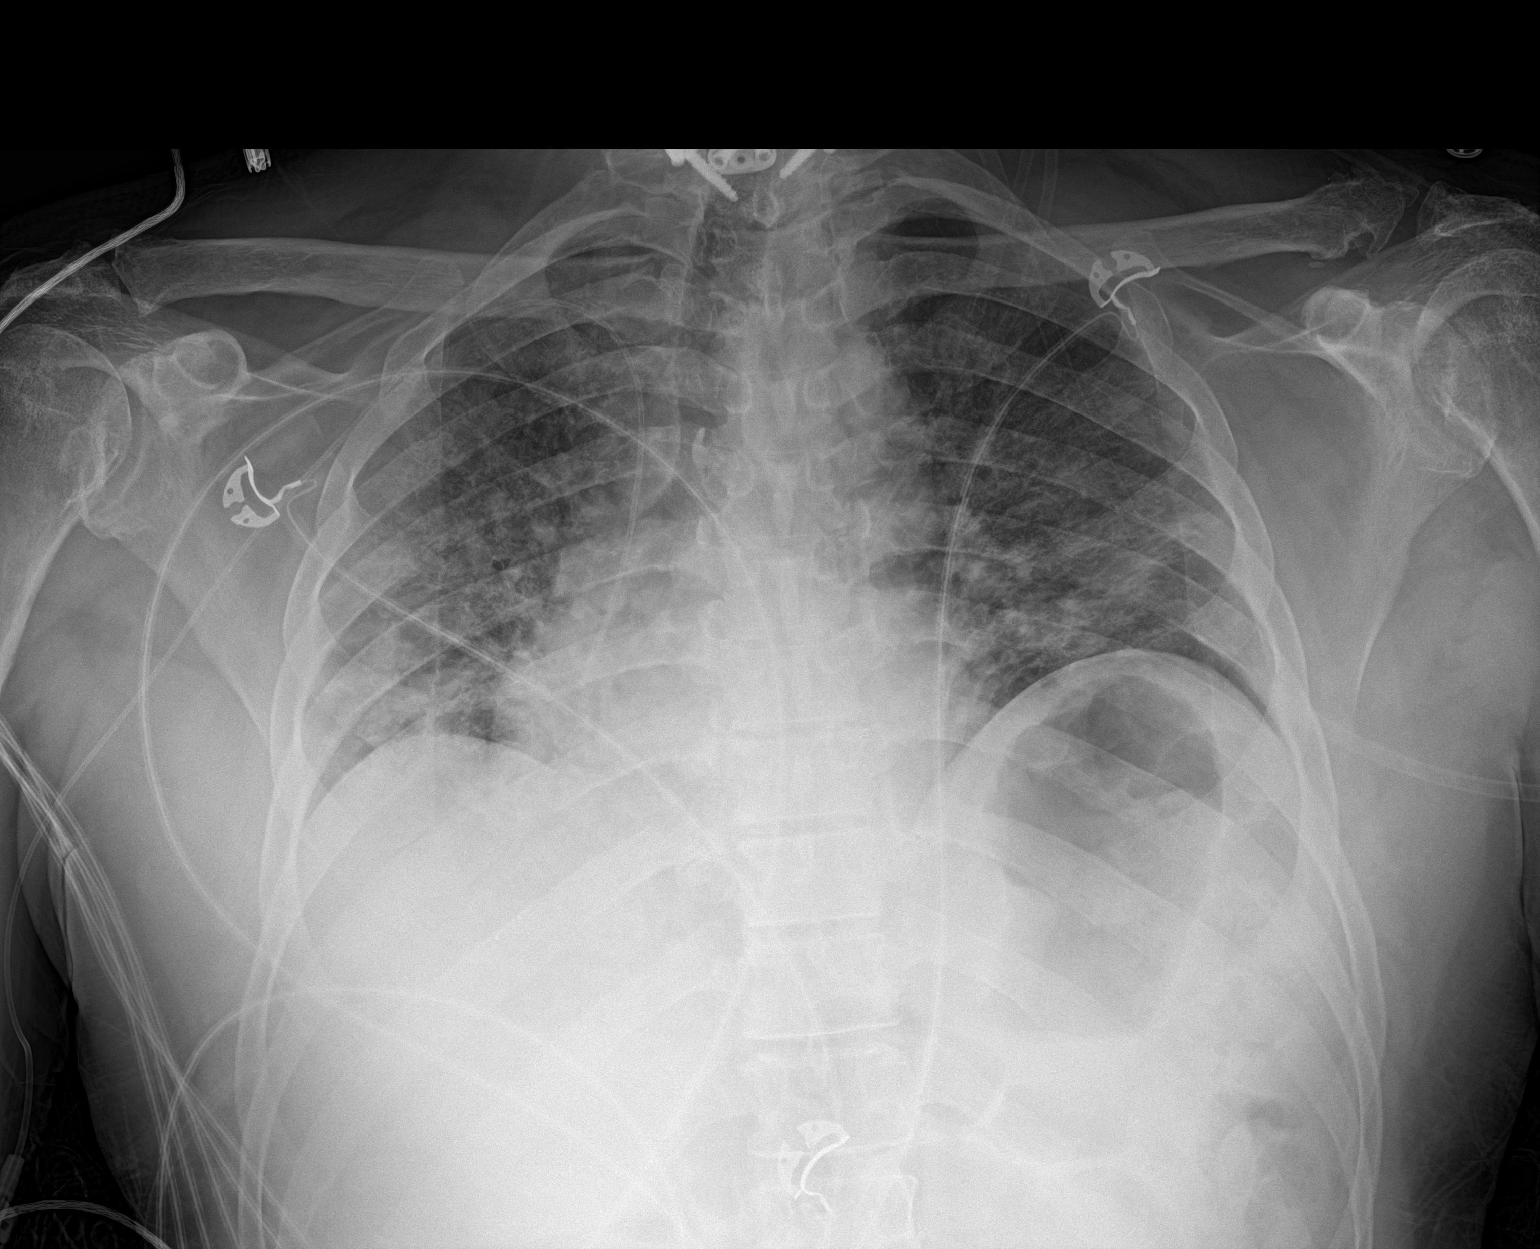

[1 of 1 positions shown; findings below may reference images not displayed]

FINDINGS: Patchy bilateral pneumonia with progression. Lung volumes are low.
Normal heart size and mediastinal contours. PICC with tip at the
upper right atrium. No effusion or pneumothorax.
IMPRESSION: Progressive bilateral pneumonia when compared to radiograph 8 days
ago.

## 2020-07-15 IMAGING — DX DG CHEST 1V PORT
1 series · 1 of 1 positions shown · non-contrast
Comparison: 10/10/2019.

CLINICAL DATA: [id number] pneumonia.

EXAM:
PORTABLE CHEST 1 VIEW

[chest]
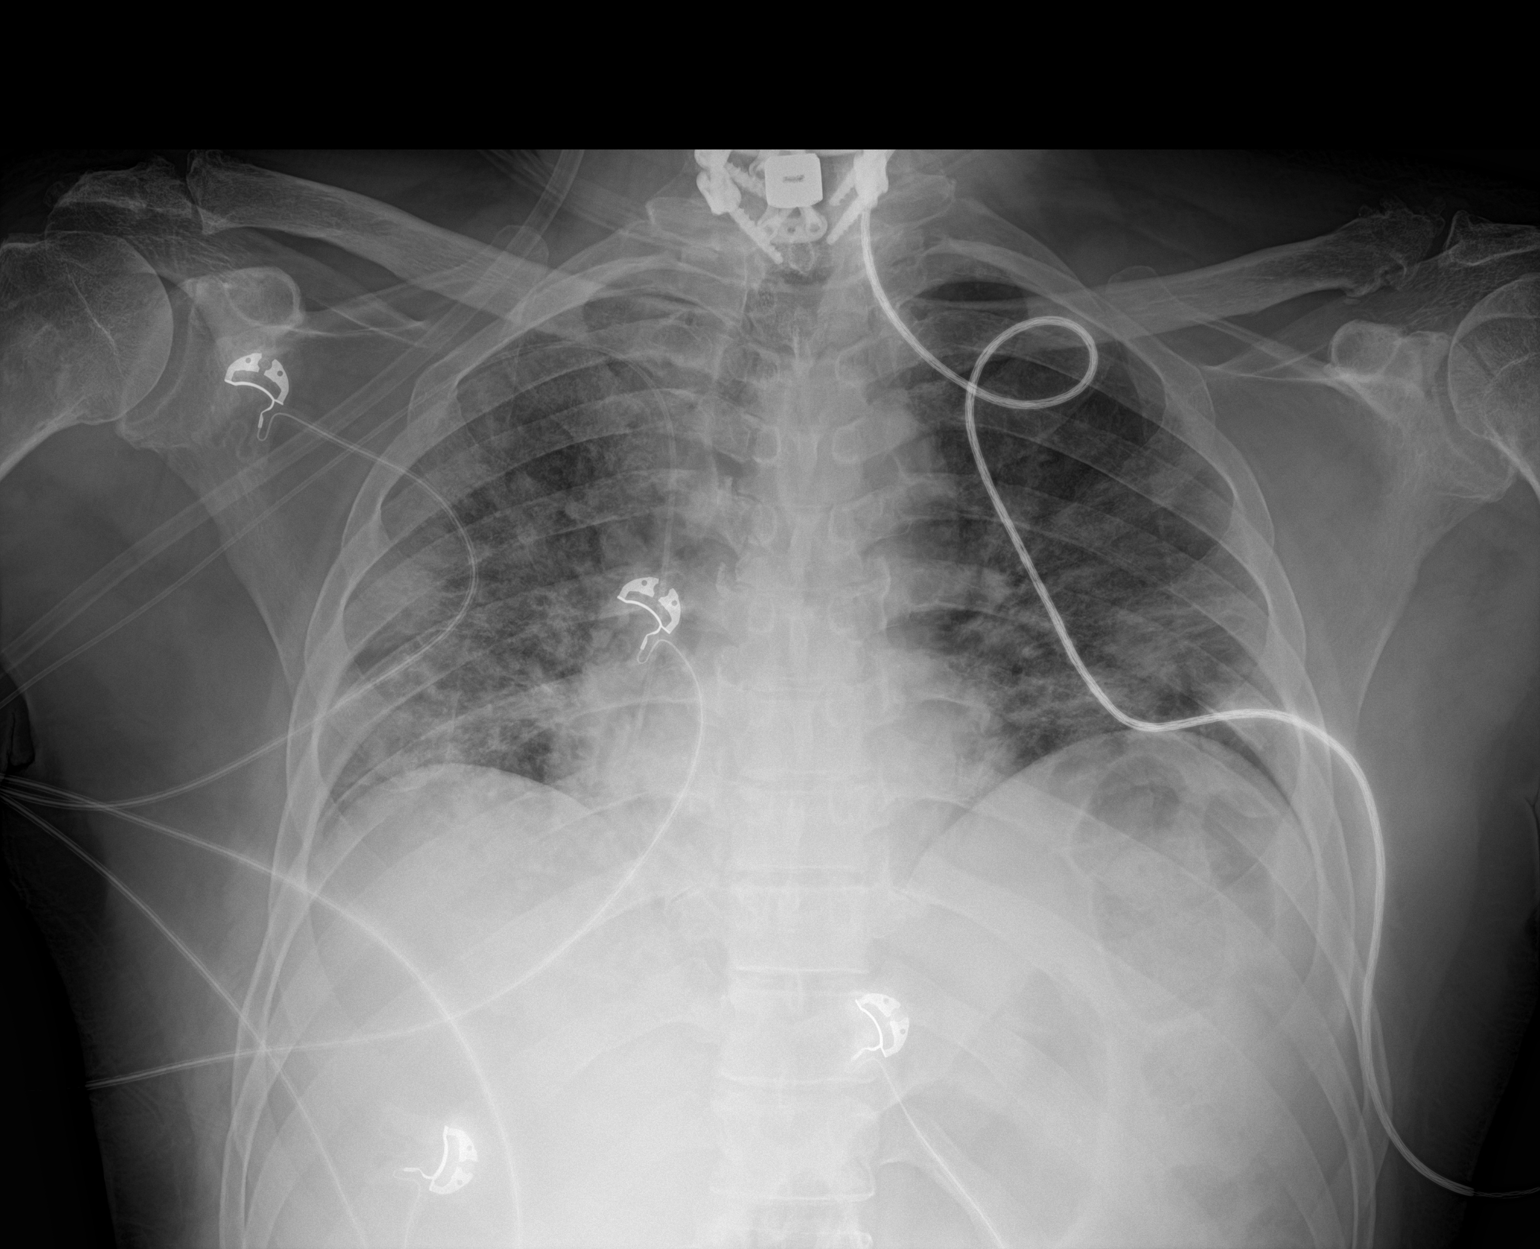

[1 of 1 positions shown; findings below may reference images not displayed]

FINDINGS: PICC line in stable position. Mediastinum hilar structures are
unremarkable. Heart size normal. Diffuse bilateral pulmonary
infiltrates are again noted. No interim change. No pleural effusion
or pneumothorax. Prior cervical spine fusion.
IMPRESSION: 1.  PICC line stable position.

2. Diffuse bilateral pulmonary infiltrates are again noted. No
interim change.

## 2020-07-16 IMAGING — CT CT HEAD W/O CM
2 series · 16 of 30 positions shown, 20 images · non-contrast
Comparison: CT head 10/07/2019

CLINICAL DATA: Fall in hospital several days ago. Mental status
change

EXAM:
CT HEAD WITHOUT CONTRAST
TECHNIQUE: Contiguous axial images were obtained from the base of the skull
through the vertex without intravenous contrast.

[Series 2: routine head w/o · axial · non-contrast · 0.45mm/px · z∈[-17,+122]mm · 13 of 34 slices shown, 17 images]
[im 3/34  brain]
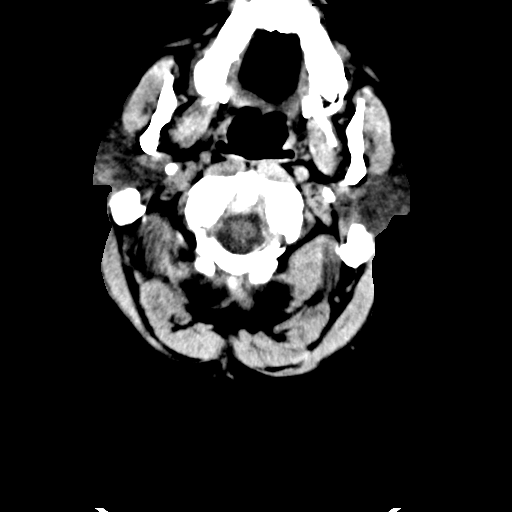
[im 3/34  bone]
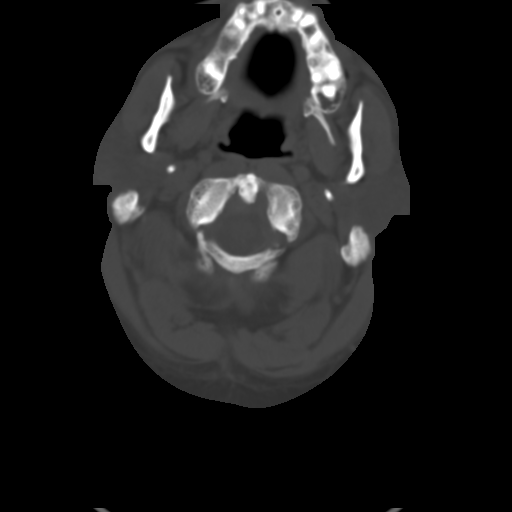
[im 5/34  brain]
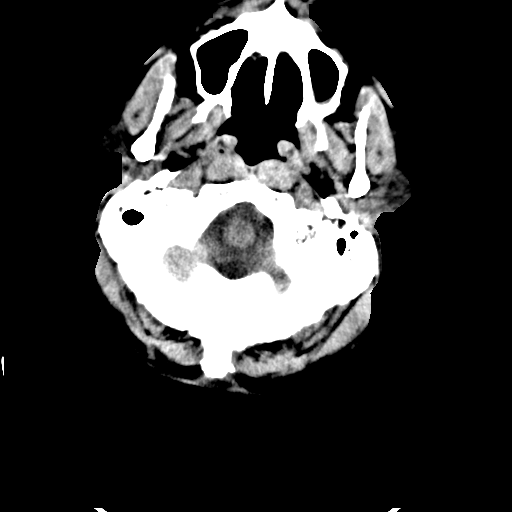
[im 8/34  brain]
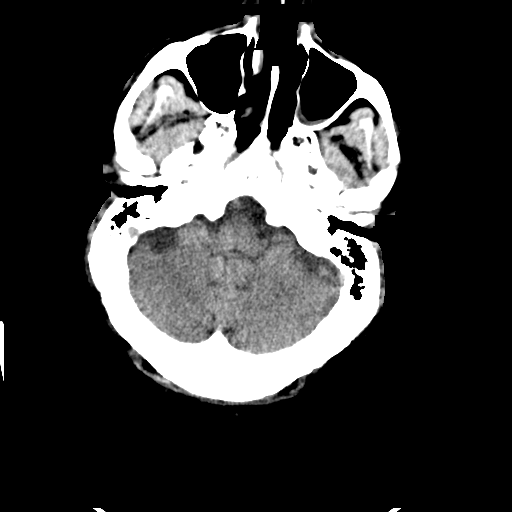
[im 10/34  brain]
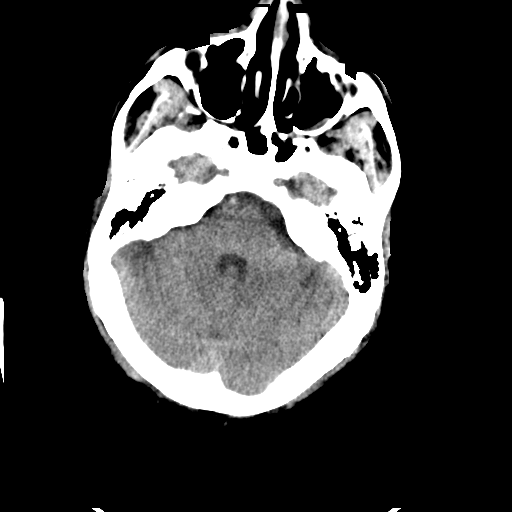
[im 12/34  brain]
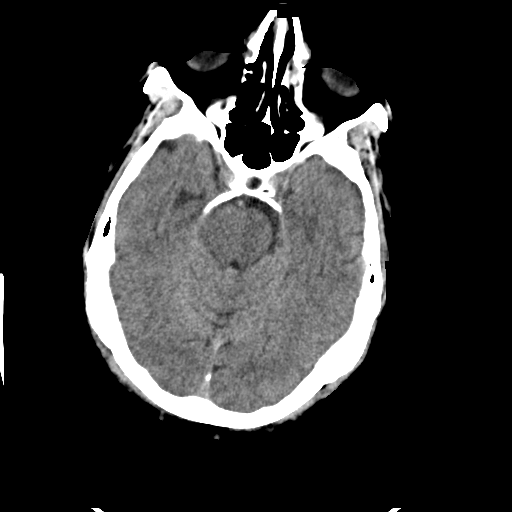
[im 12/34  bone]
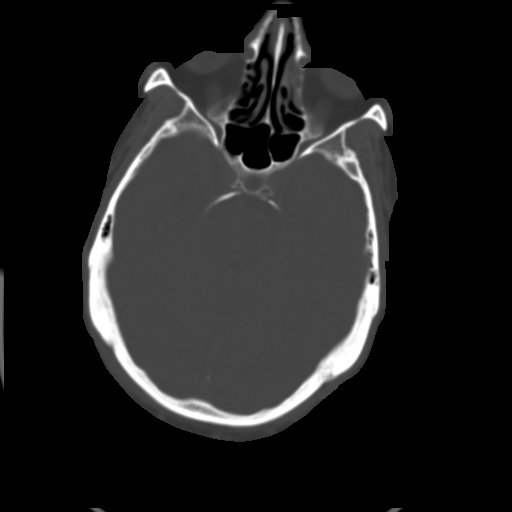
[im 15/34  brain]
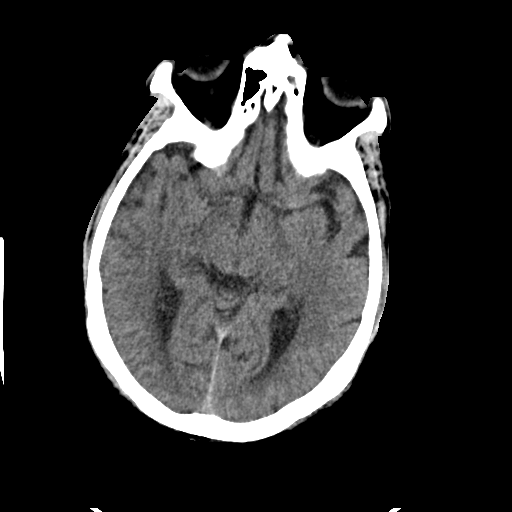
[im 17/34  brain]
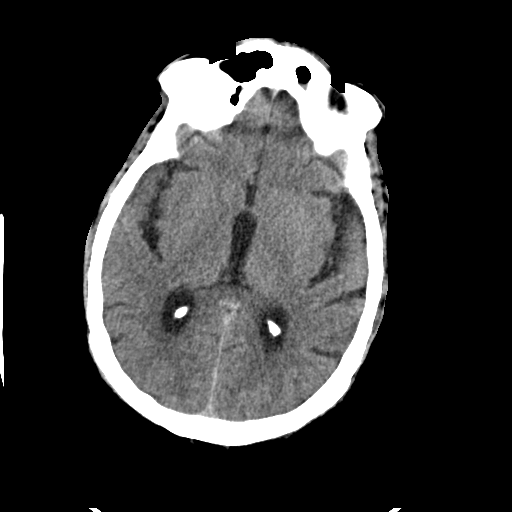
[im 19/34  brain]
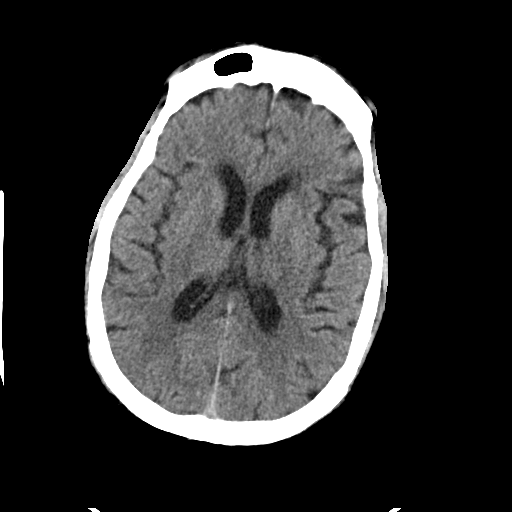
[im 22/34  brain]
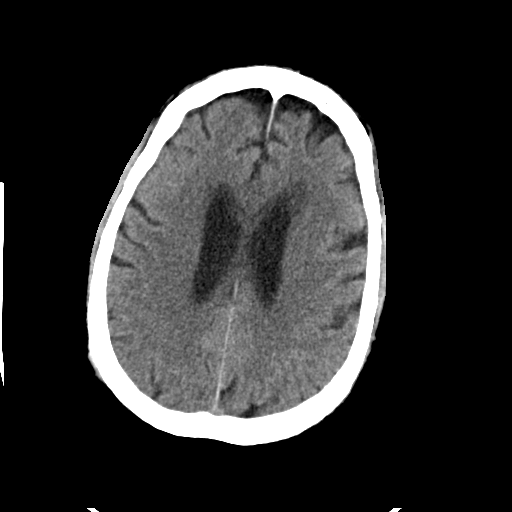
[im 22/34  bone]
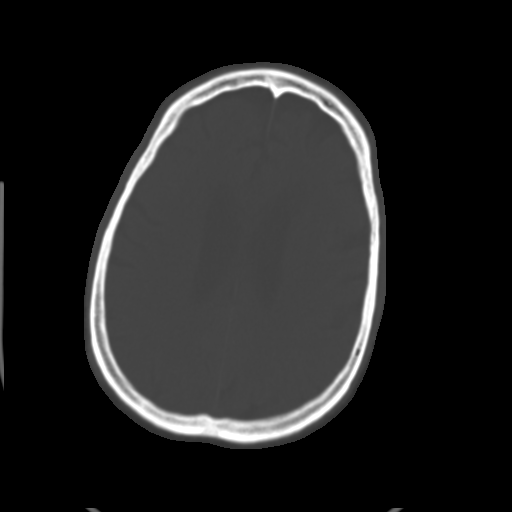
[im 24/34  brain]
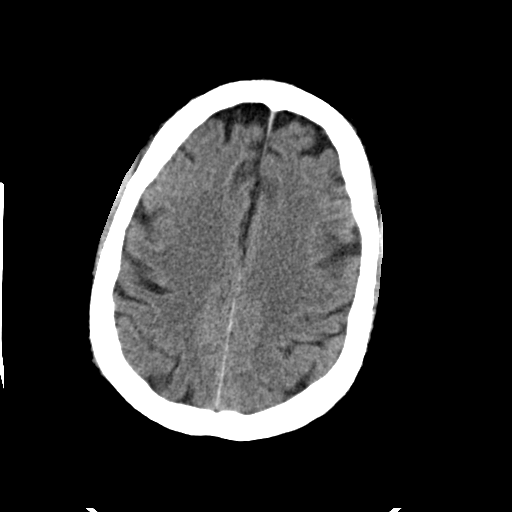
[im 26/34  brain]
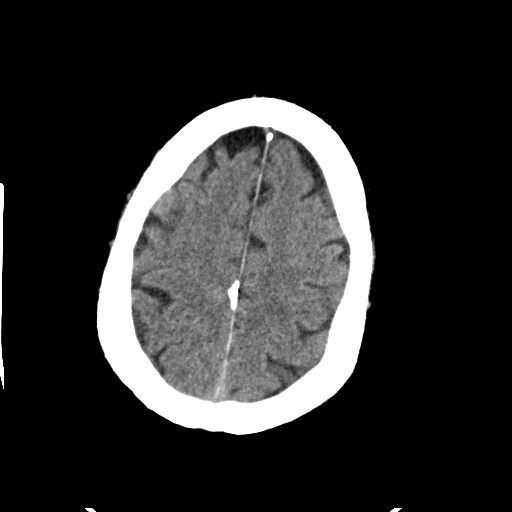
[im 29/34  brain]
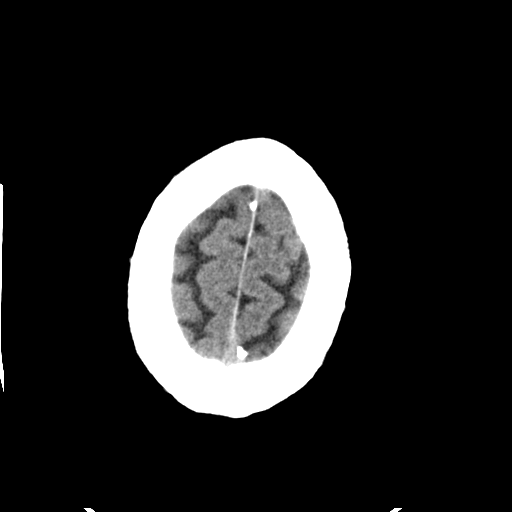
[im 31/34  brain]
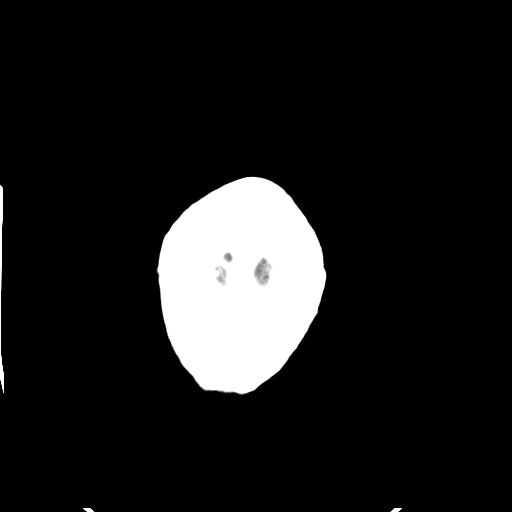
[im 31/34  bone]
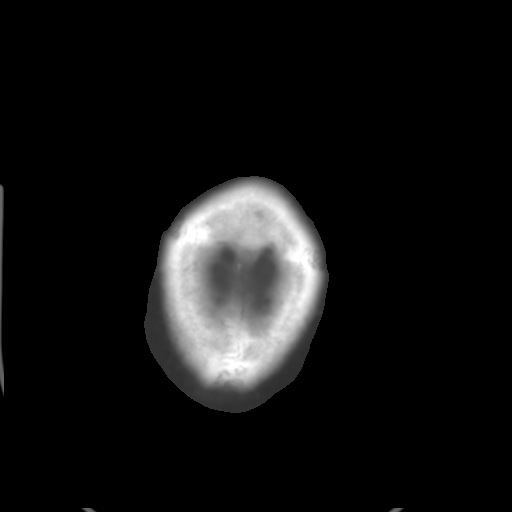

[Series 4: routine head bone · axial · 0.45mm/px · z∈[-17,+28]mm · 3 of 34 slices shown]
[im 3/34  bone]
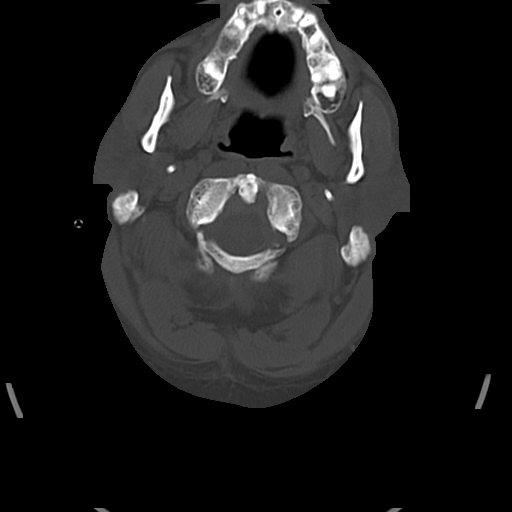
[im 8/34  bone]
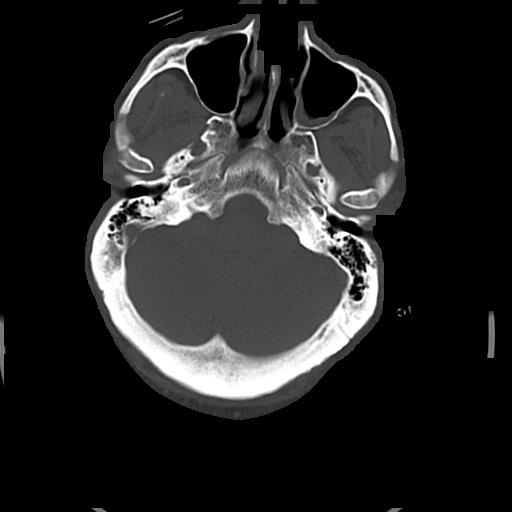
[im 12/34  bone]
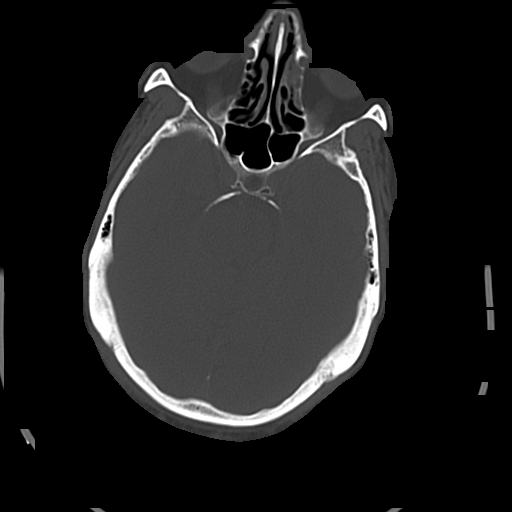

[16 of 30 positions shown; findings below may reference images not displayed]

FINDINGS: Brain: No evidence of acute infarction, hemorrhage, hydrocephalus,
extra-axial collection or mass lesion/mass effect. Mild white matter
changes consistent with chronic microvascular ischemia. No midline
shift.

Vascular: Negative for hyperdense vessel

Skull: Negative

Sinuses/Orbits: Mucosal edema paranasal sinuses.  Negative orbit.

Other: None
IMPRESSION: No acute abnormality and  no change from 6 days ago.

## 2020-07-23 IMAGING — DX DG ABD PORTABLE 1V
1 series · 2 of 2 positions shown · non-contrast
Comparison: 05/06/2018

CLINICAL DATA: Feeding tube placement

EXAM:
PORTABLE ABDOMEN - 1 VIEW

[Series 1: abdomen · 0.14mm/px · 2 of 2 slices shown]
[im 1/2]
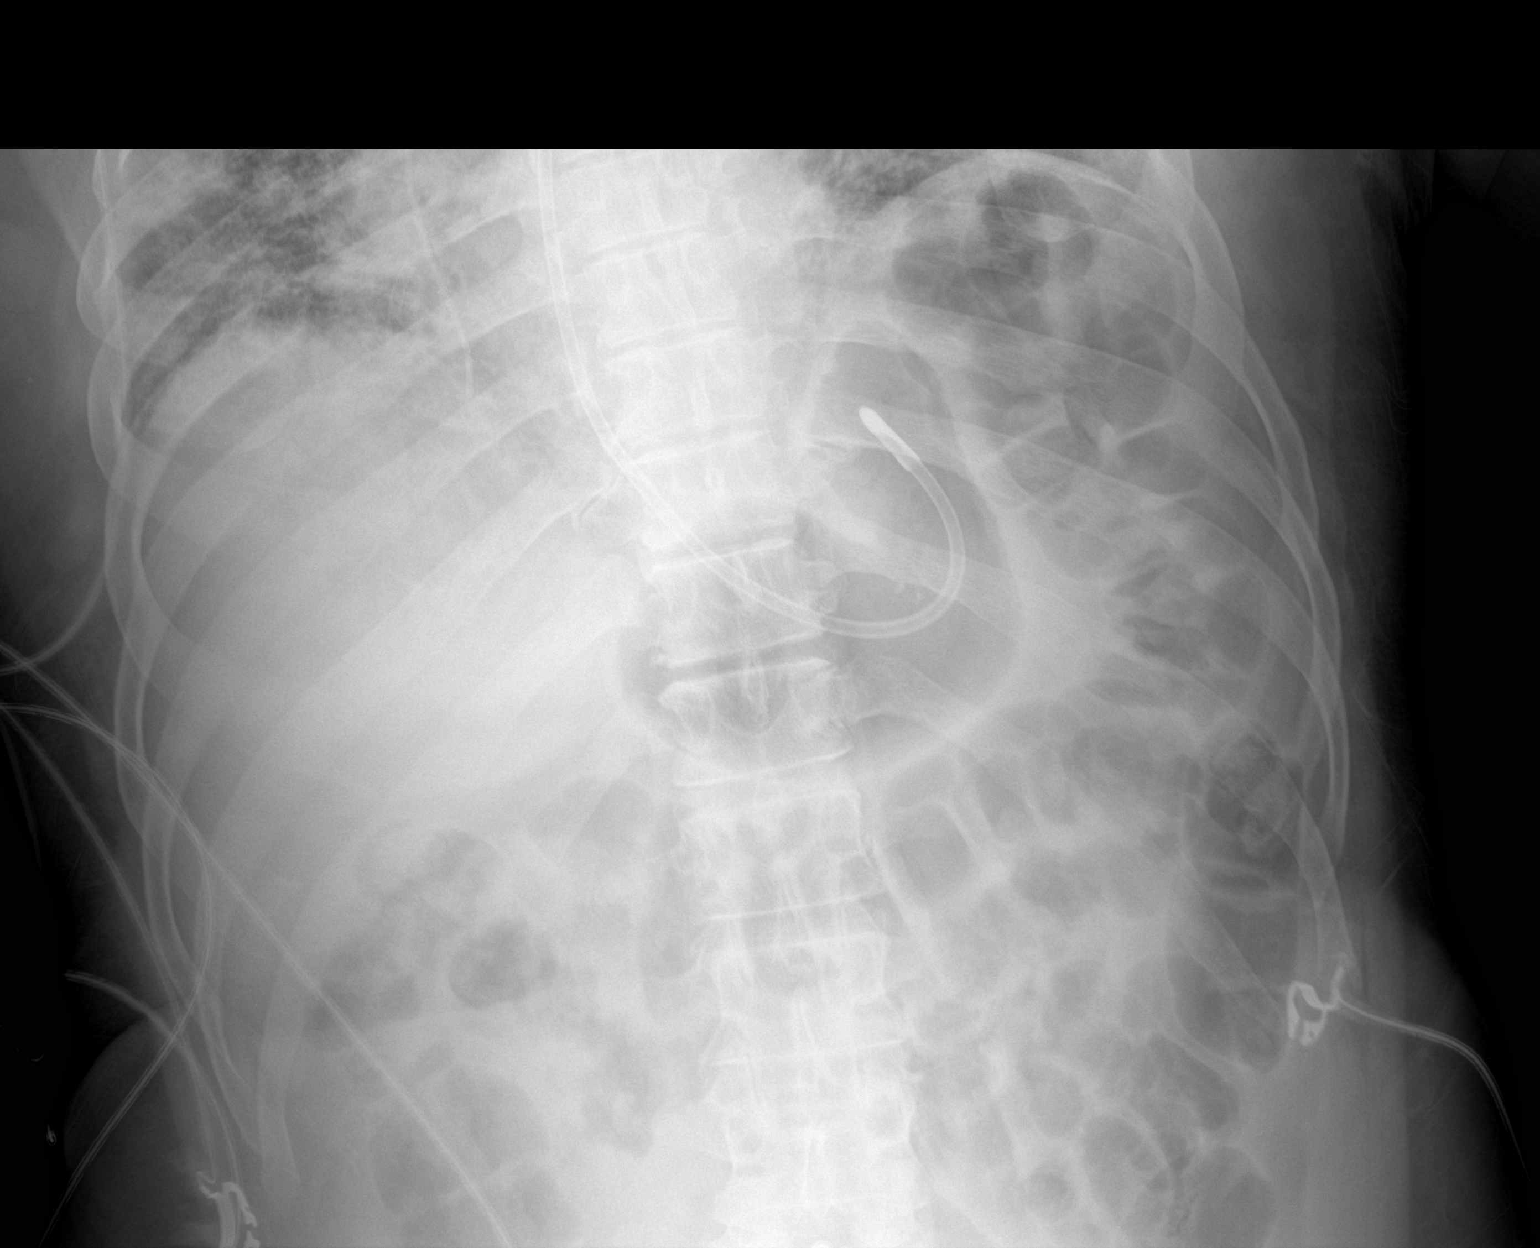
[im 2/2]
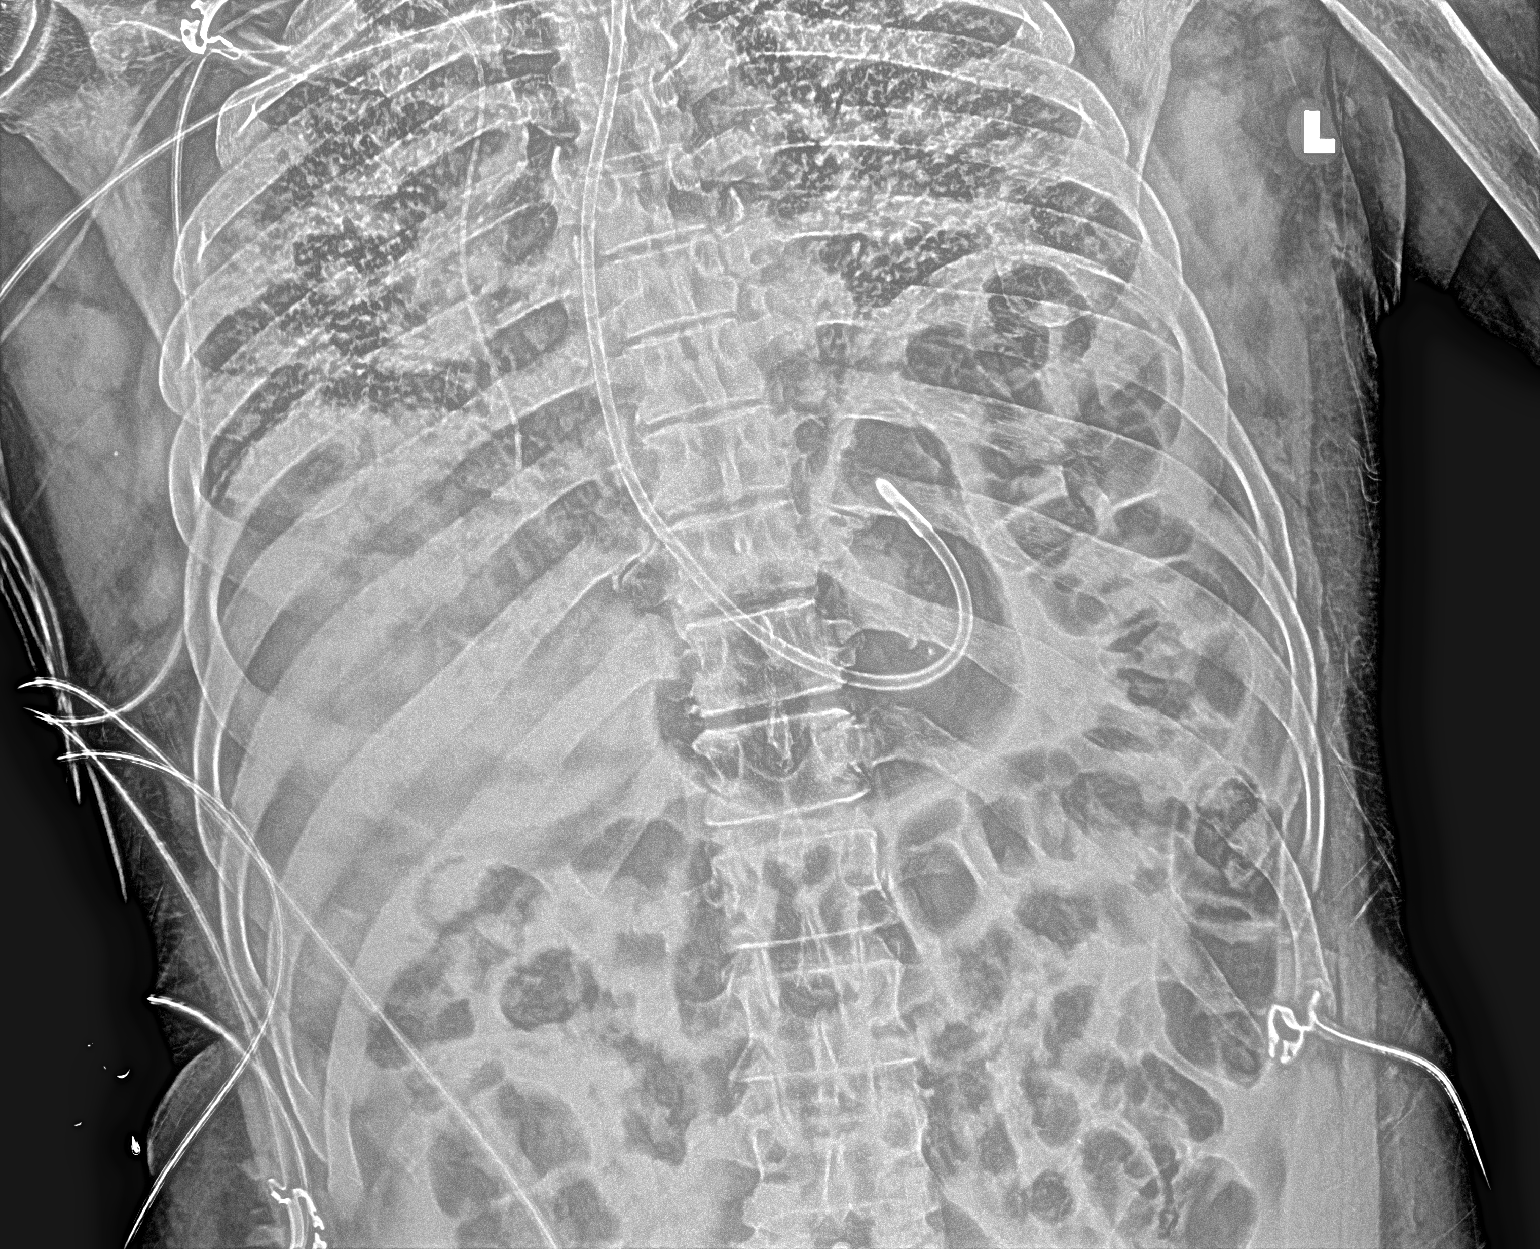

[2 of 2 positions shown; findings below may reference images not displayed]

FINDINGS: Feeding tube is located within the stomach. The tip is in the fundus
of the stomach.
IMPRESSION: Feeding tube tip in the fundus of the stomach.

## 2020-07-25 IMAGING — DX DG CHEST 1V PORT
1 series · 1 of 1 positions shown · non-contrast
Comparison: Chest x-ray 10/12/2019.

CLINICAL DATA: 63-year-old male with history of pneumonia from
[id number] infection.

EXAM:
PORTABLE CHEST 1 VIEW

[chest]
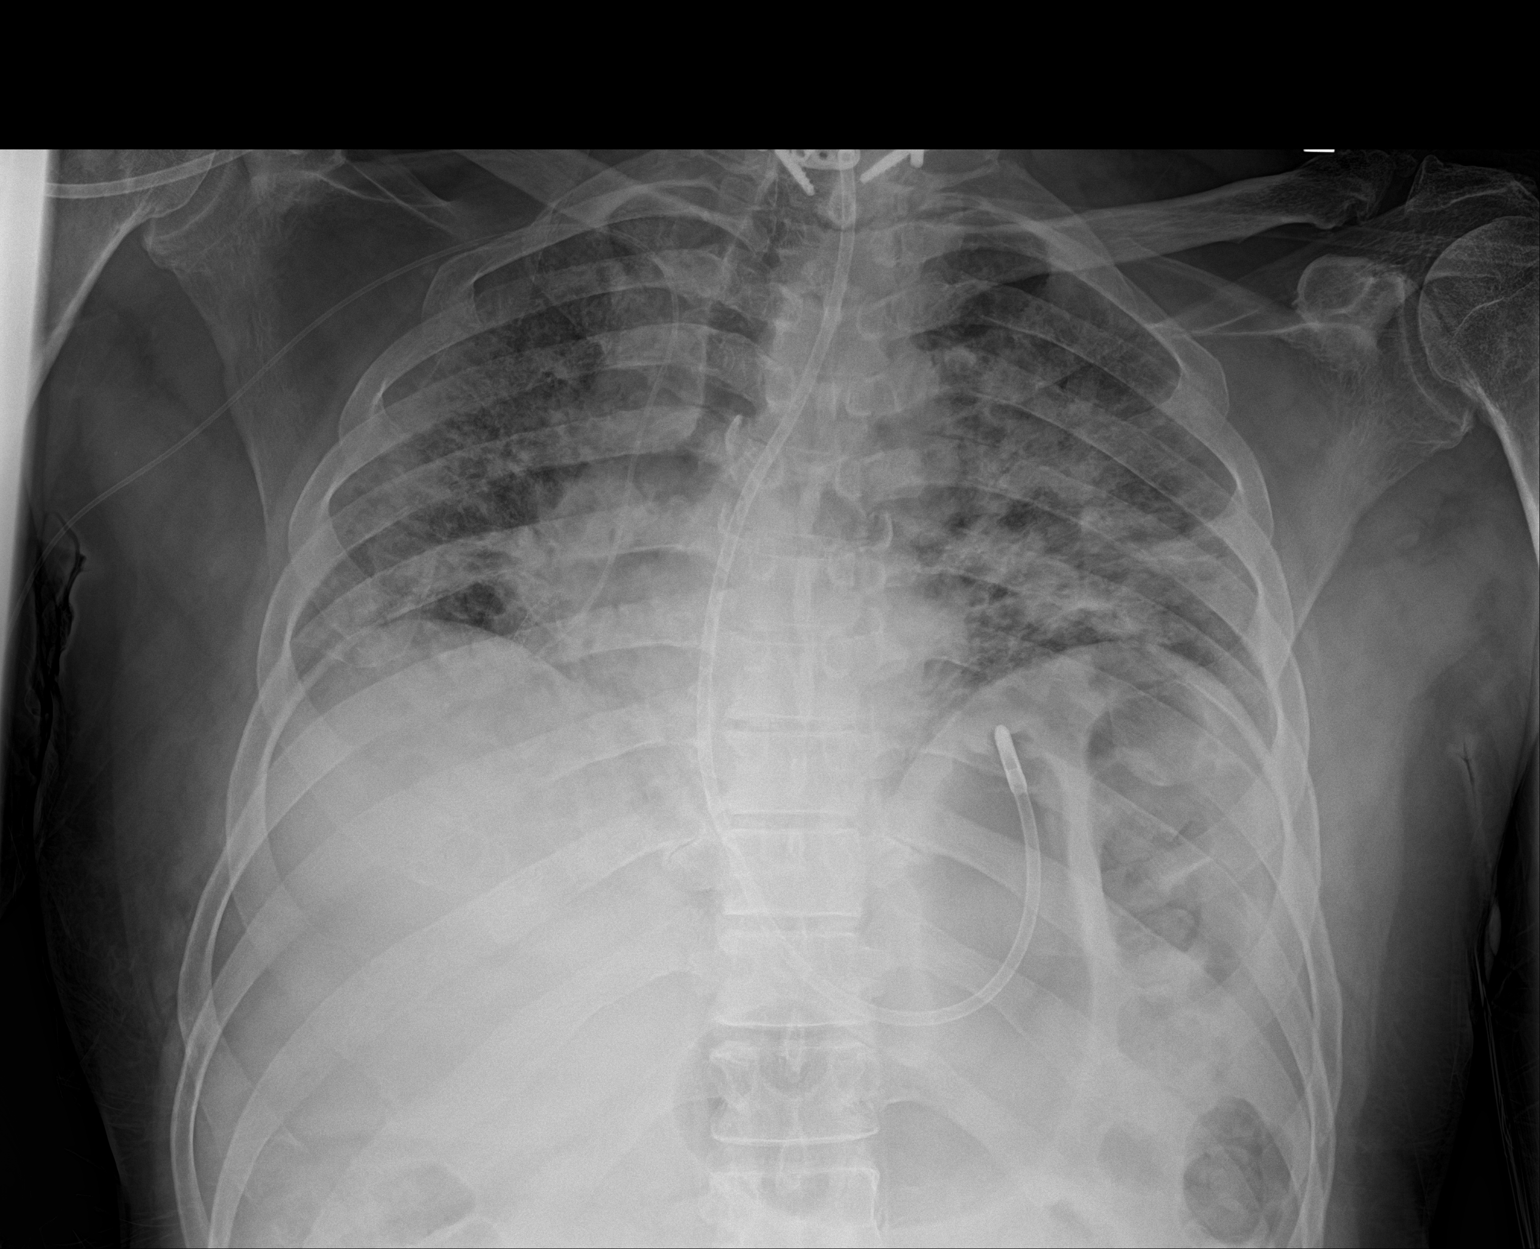

[1 of 1 positions shown; findings below may reference images not displayed]

FINDINGS: There is a right upper extremity PICC with tip terminating in the
right atrium. Feeding tube extending into the proximal stomach. Lung
volumes are low. Worsening patchy multifocal interstitial and
airspace disease scattered throughout all aspects of the lungs
bilaterally, compatible with progressive multilobar pneumonia. No
definite pleural effusions. Pulmonary vasculature is obscured. Heart
size is normal. The patient is rotated to the right on today's exam,
resulting in distortion of the mediastinal contours and reduced
diagnostic sensitivity and specificity for mediastinal pathology.
Orthopedic fixation hardware in the cervical spine.
IMPRESSION: 1. Severe worsening multilobar bilateral pneumonia, as above.
2. Support apparatus, as above.

## 2020-07-26 IMAGING — DX DG CHEST 1V PORT
1 series · 1 of 1 positions shown · non-contrast
Comparison: 10/22/2019

CLINICAL DATA: Intubation

EXAM:
PORTABLE CHEST 1 VIEW

[chest ap]
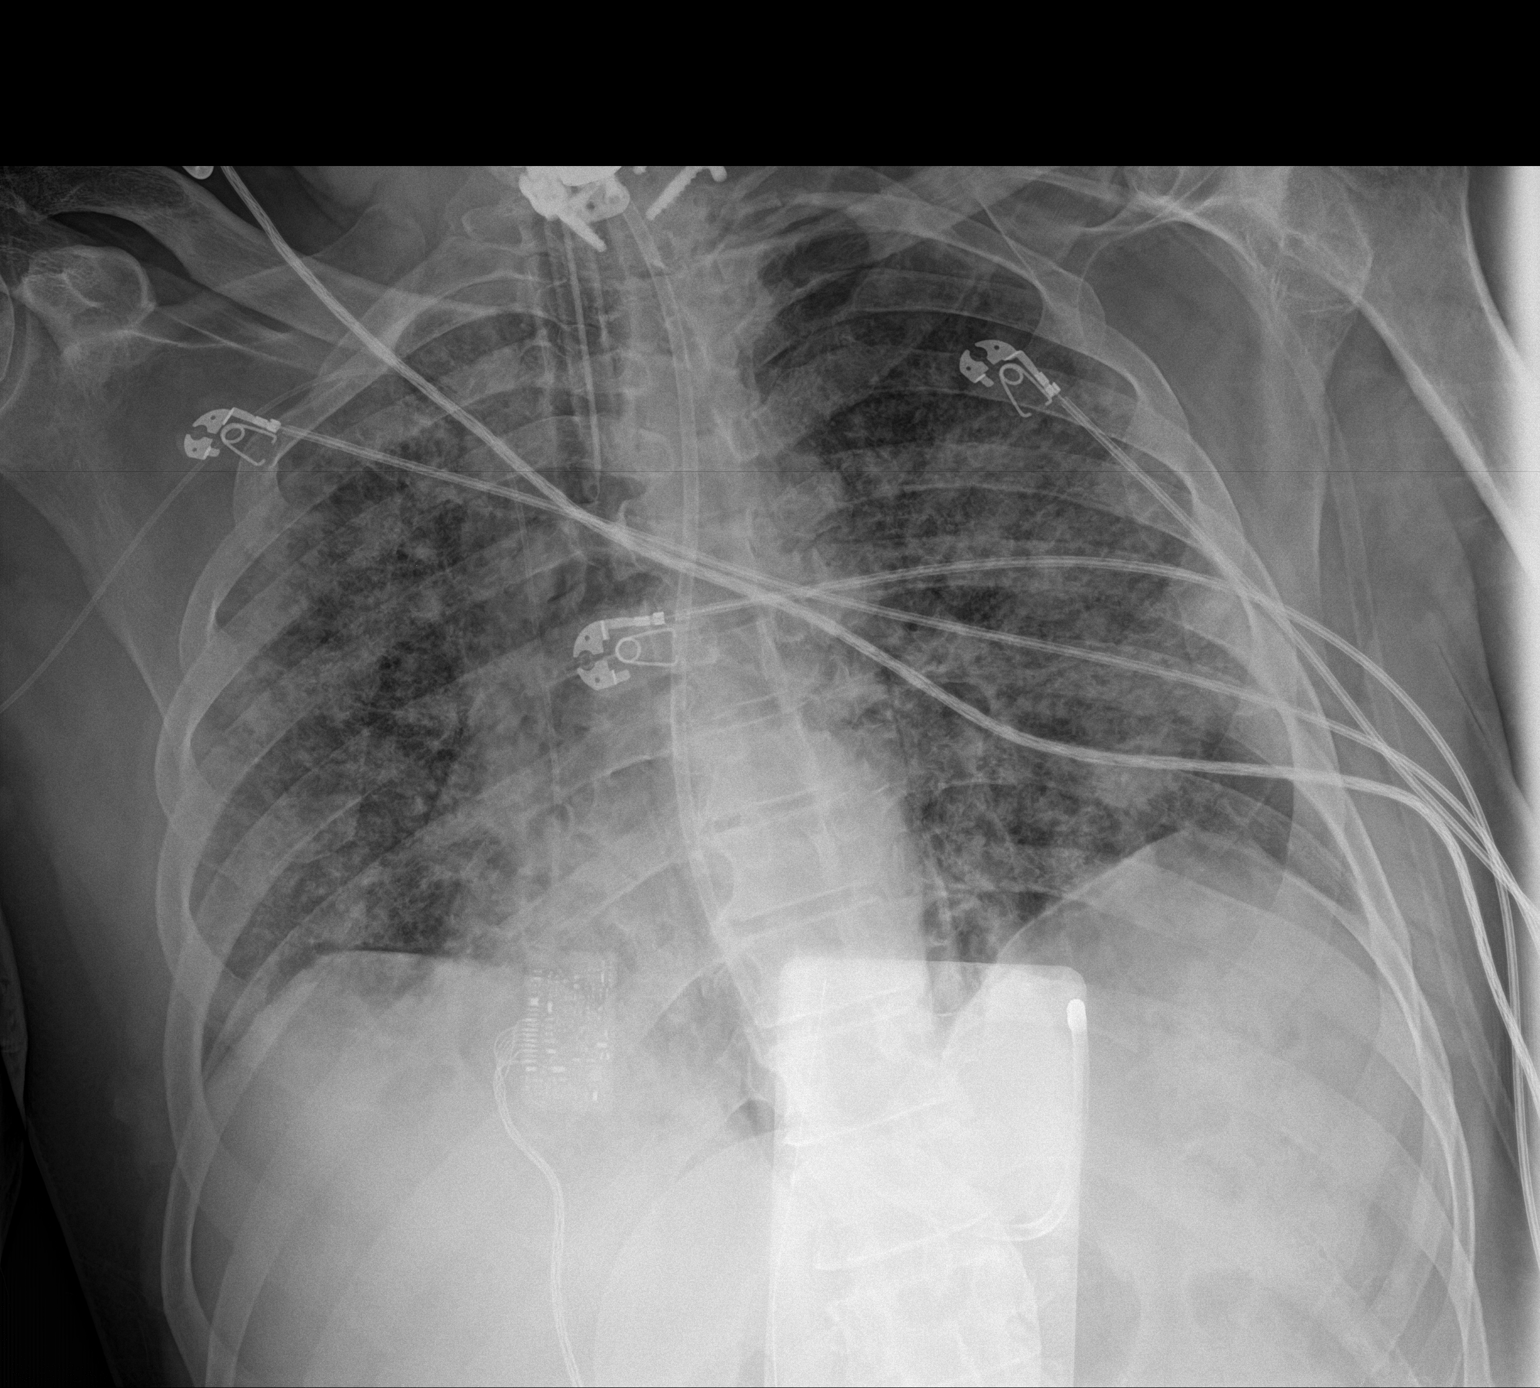

[1 of 1 positions shown; findings below may reference images not displayed]

FINDINGS: Interval placement of endotracheal tube, tip positioned over the mid
trachea. Enteric feeding tube, tip position within the gastric
fundus. Right upper extremity PICC. Extensive bilateral
heterogeneous and interstitial airspace opacity, unchanged compared
to prior examination.
IMPRESSION: 1. Interval placement of endotracheal tube, tip positioned over the
mid trachea.
2. Extensive bilateral heterogeneous and interstitial airspace
opacity, unchanged compared to prior examination, consistent with
multifocal infection and/or ARDS.
3. Enteric feeding tube tip is in the gastric fundus.

## 2020-07-30 IMAGING — DX DG CHEST 1V PORT
1 series · 1 of 1 positions shown · non-contrast
Comparison: 10/25/2019.

CLINICAL DATA: Acute respiratory failure.

EXAM:
PORTABLE CHEST 1 VIEW

[chest ap]
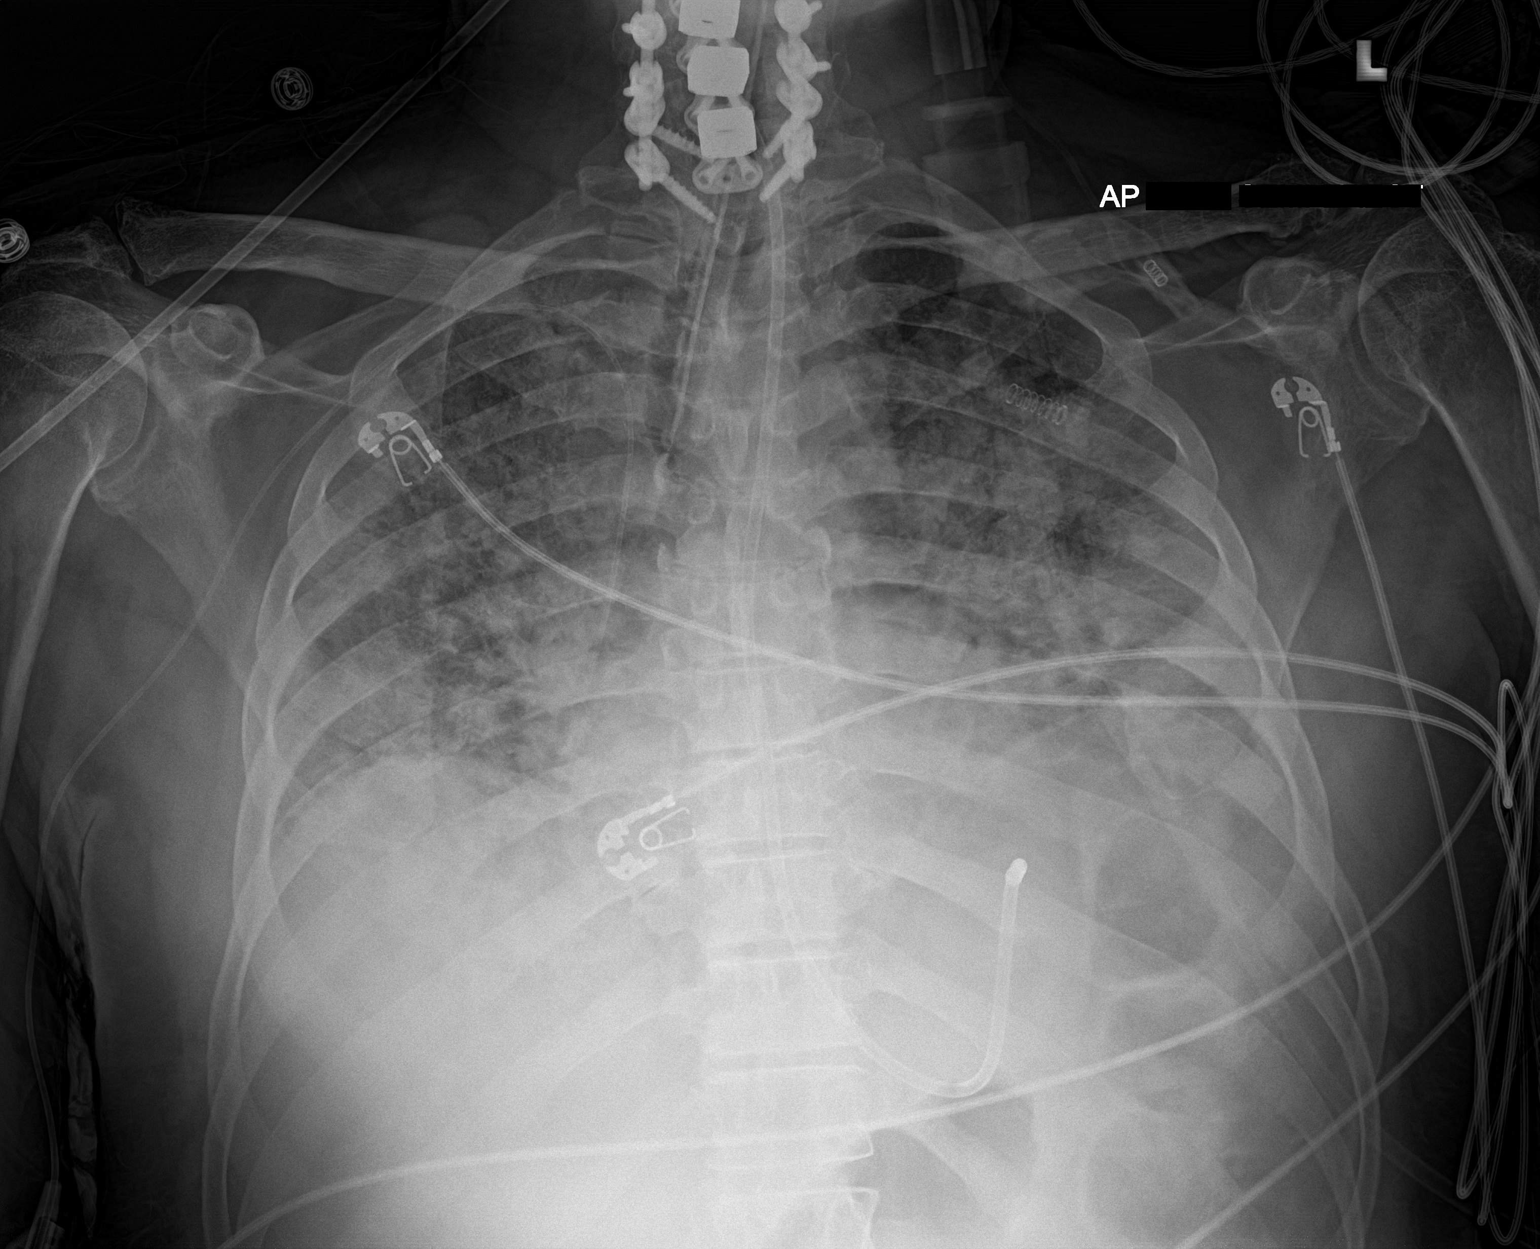

[1 of 1 positions shown; findings below may reference images not displayed]

FINDINGS: Endotracheal tube, feeding tube, right PICC line in stable position.
Heart size stable. Diffuse severe bilateral pulmonary
infiltrates/edema unchanged. No pleural effusion or pneumothorax.
Prior cervical spine fusion.
IMPRESSION: 1.  Lines and tubes stable position.

2.  Diffuse severe bilateral pulmonary infiltrates/edema unchanged.

## 2020-08-02 IMAGING — DX DG CHEST 1V PORT
1 series · 1 of 1 positions shown · non-contrast
Comparison: 10/27/2019

CLINICAL DATA: Acute respiratory failure

EXAM:
PORTABLE CHEST 1 VIEW

[chest ap]
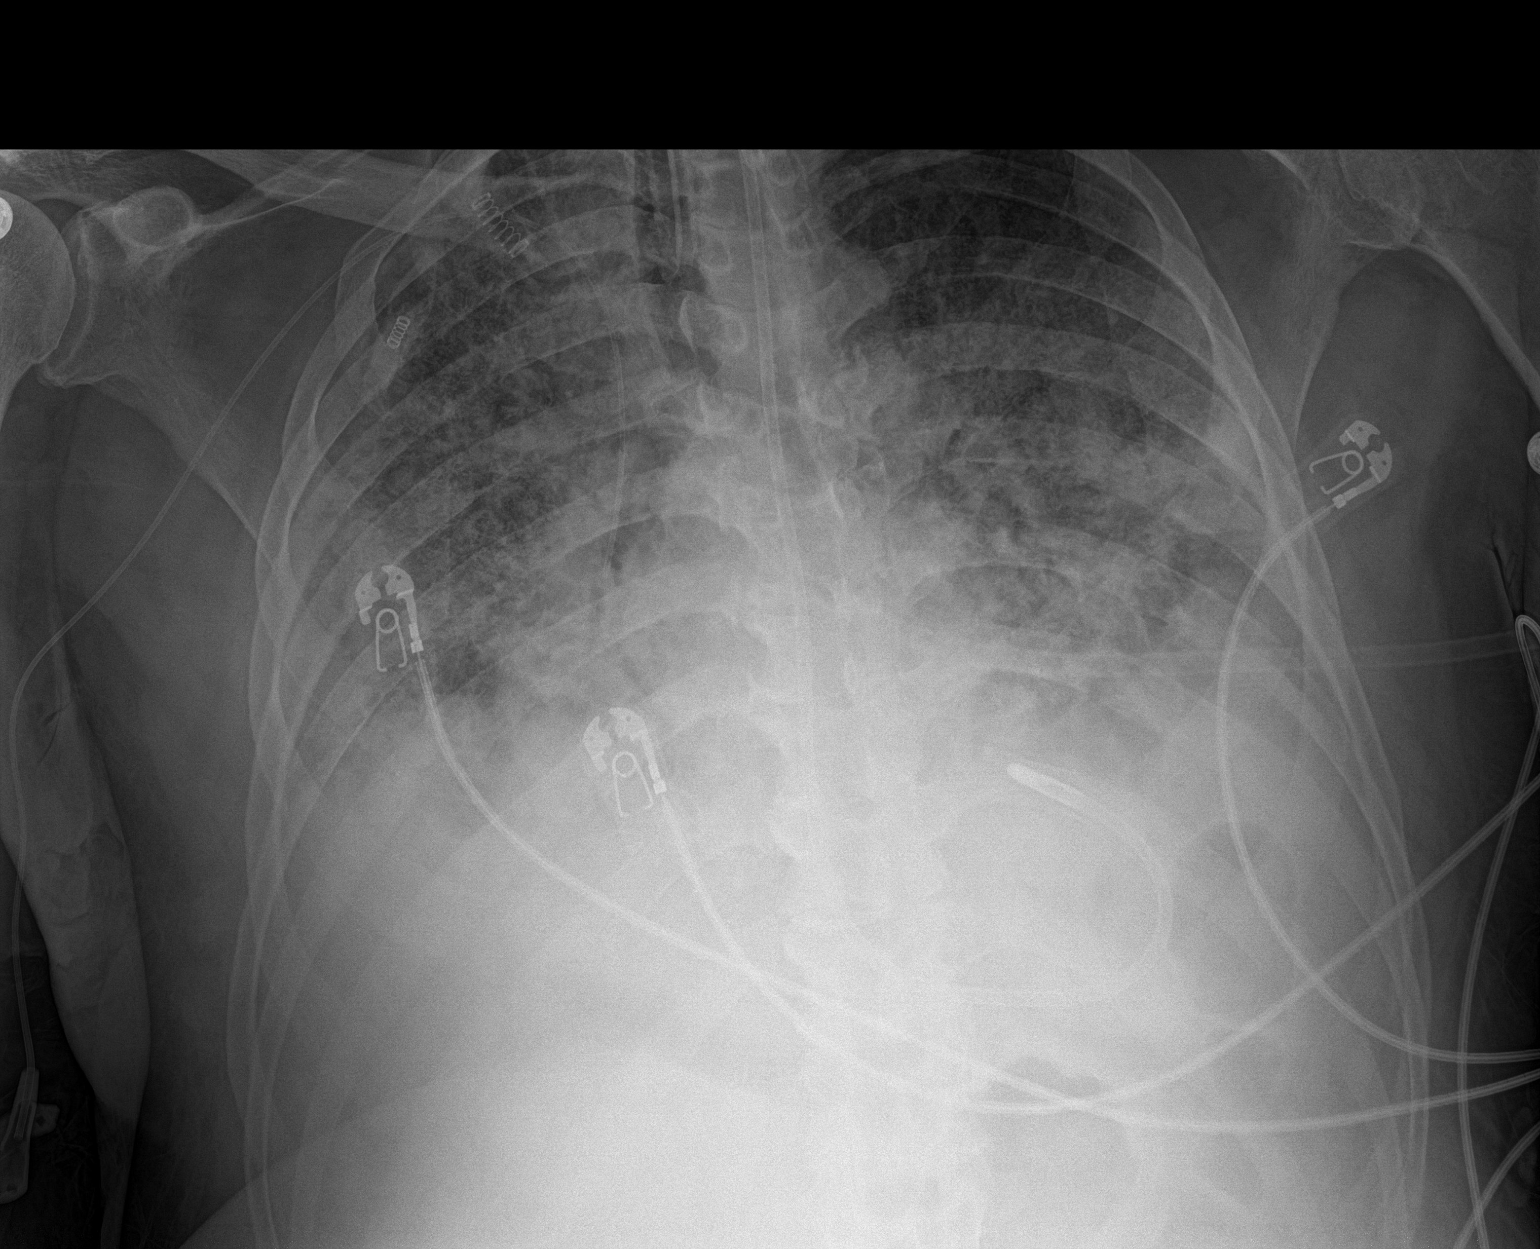

[1 of 1 positions shown; findings below may reference images not displayed]

FINDINGS: Cardiac shadow is stable. Feeding catheter and endotracheal tube as
well as a right-sided PICC line are noted and stable. Diffuse
bilateral airspace opacities are again identified throughout both
lungs. Postsurgical changes in the cervical spine are seen.
IMPRESSION: Stable airspace opacities are again seen unchanged from the prior
exam.

Tubes and lines as described.

## 2020-08-02 IMAGING — CT CT ABD-PELV W/O CM
1 of 3 series · 14 of 32 positions shown, 18 images · non-contrast
Comparison: 08/19/2019 CT abdomen/pelvis.

CLINICAL DATA: ZHILG-I8. Inpatient. Abdominal pain. Rising lactic
acid of uncertain etiology. Concern for ischemic bowel.

EXAM:
CT ABDOMEN AND PELVIS WITHOUT CONTRAST
TECHNIQUE: Multidetector CT imaging of the abdomen and pelvis was performed
following the standard protocol without IV contrast.

[Series 4: (person_name) thins · axial · 0.87mm/px · z∈[+723,+1198]mm · 14 of 767 slices shown, 18 images]
[im 59/767  soft-tissue]
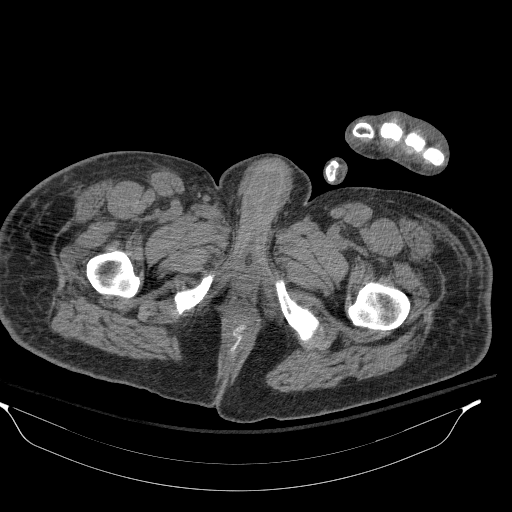
[im 59/767  bone]
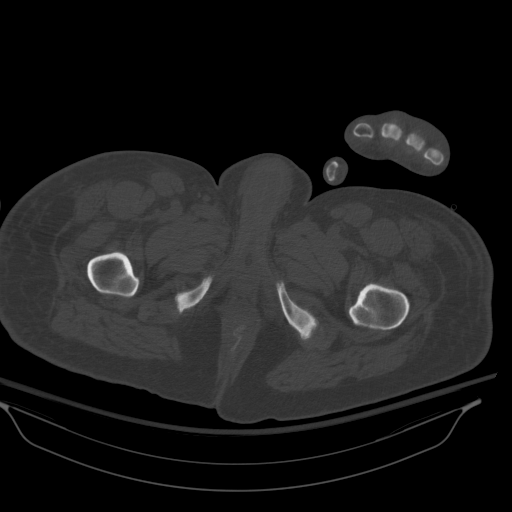
[im 118/767  soft-tissue]
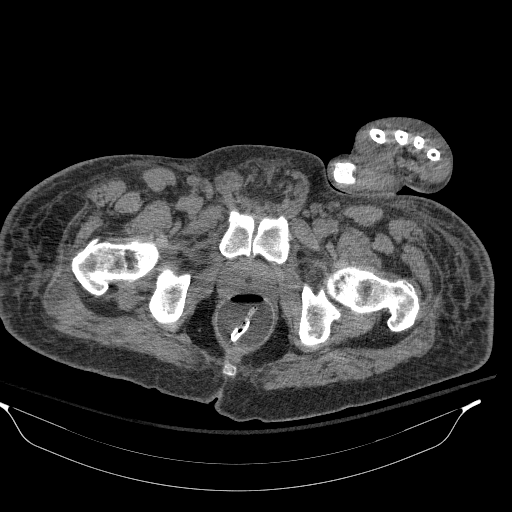
[im 177/767  soft-tissue]
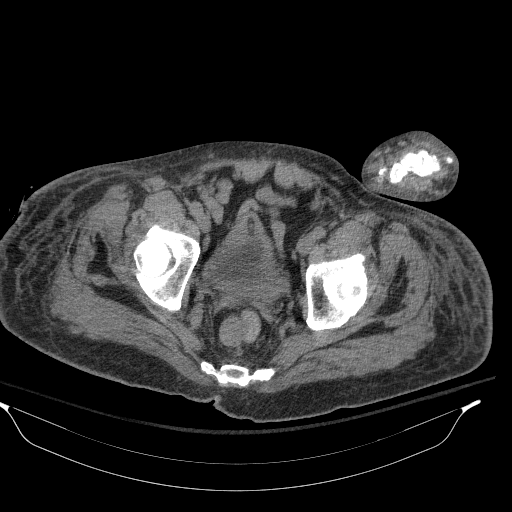
[im 236/767  soft-tissue]
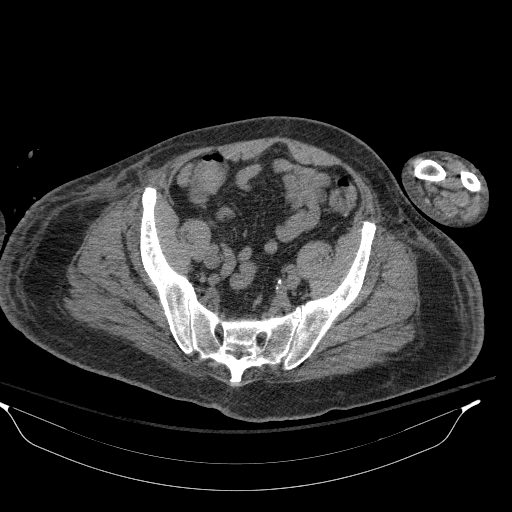
[im 295/767  soft-tissue]
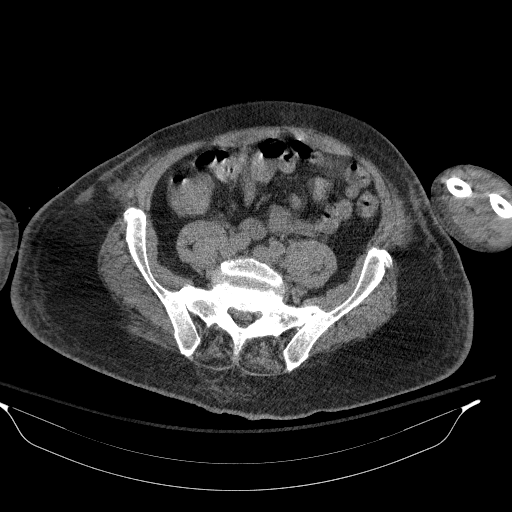
[im 354/767  soft-tissue]
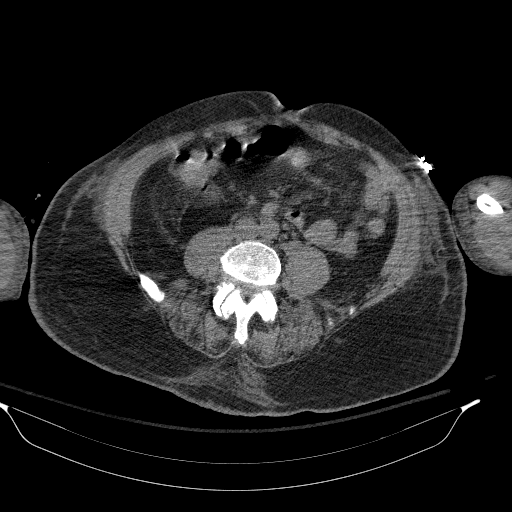
[im 413/767  soft-tissue]
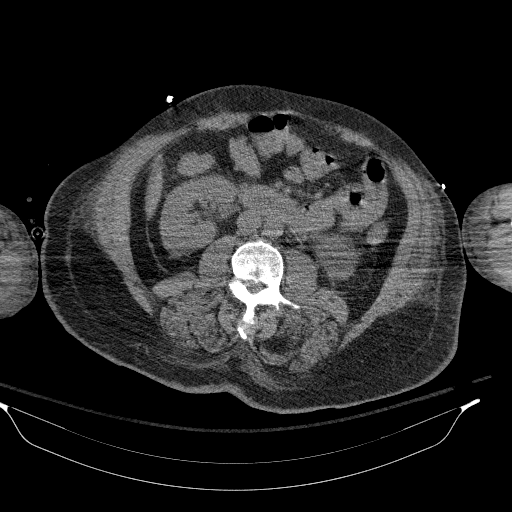
[im 472/767  soft-tissue]
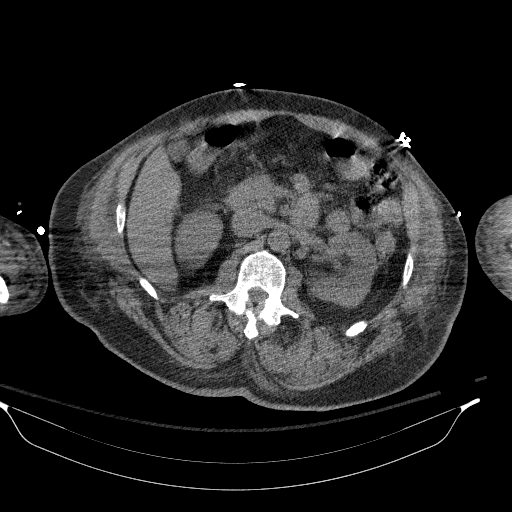
[im 531/767  soft-tissue]
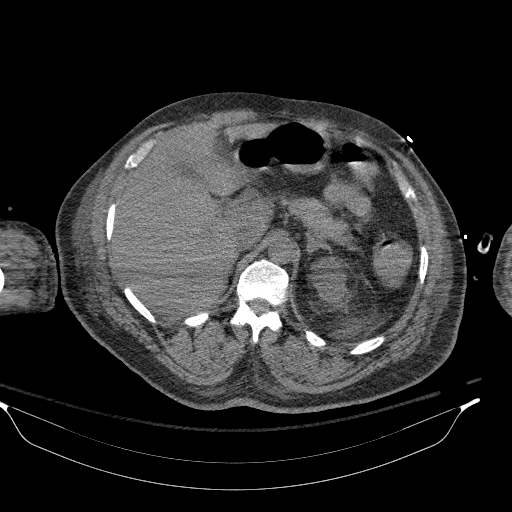
[im 531/767  bone]
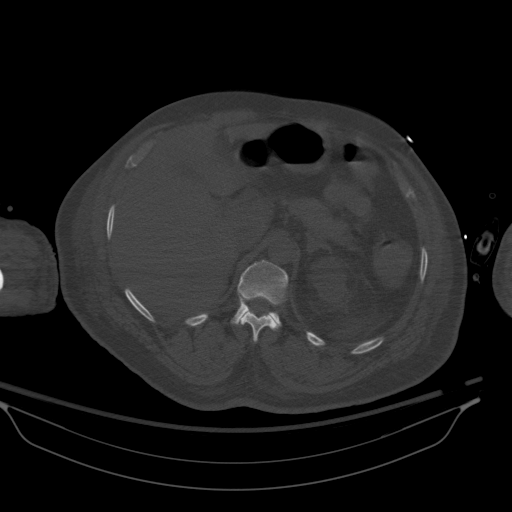
[im 590/767  soft-tissue]
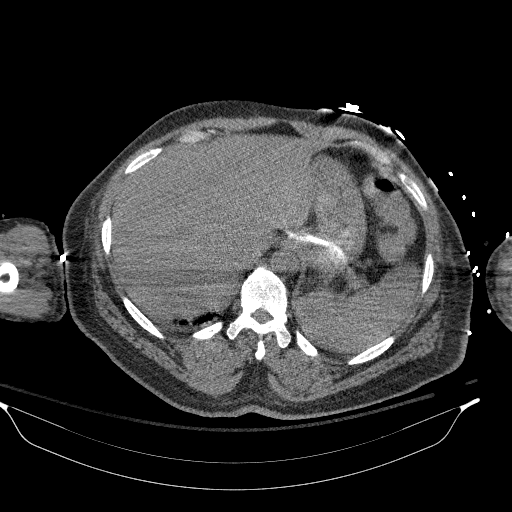
[im 649/767  soft-tissue]
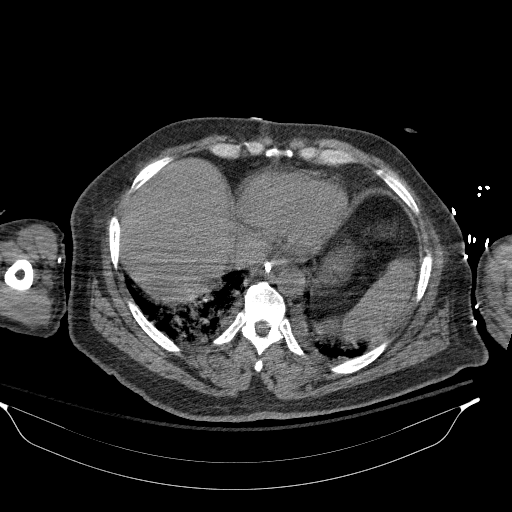
[im 649/767  lung]
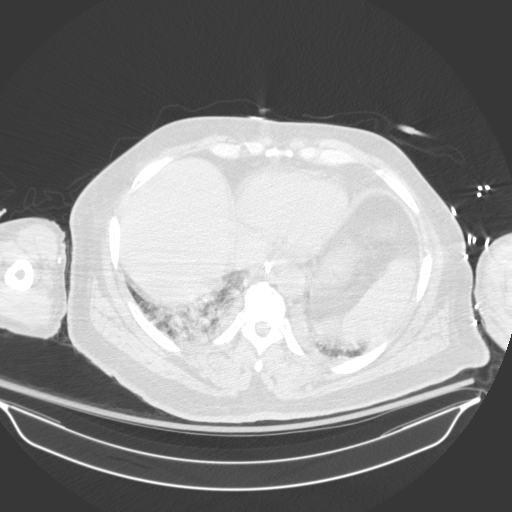
[im 678/767  lung]
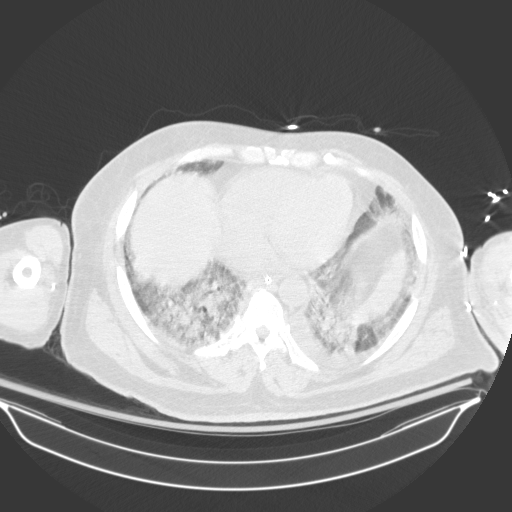
[im 708/767  soft-tissue]
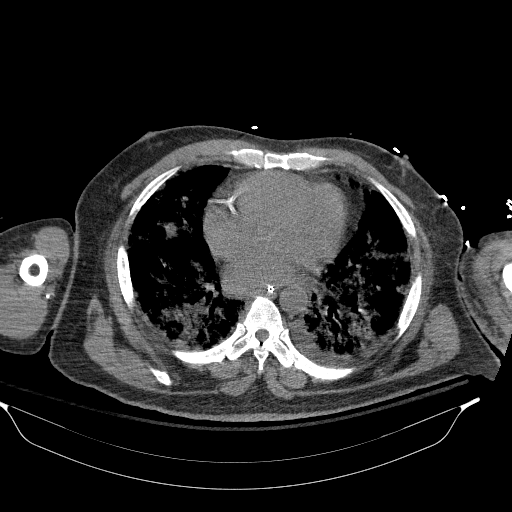
[im 708/767  lung]
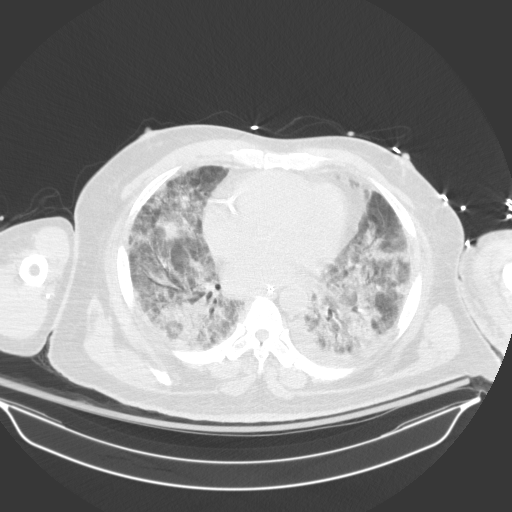
[im 737/767  lung]
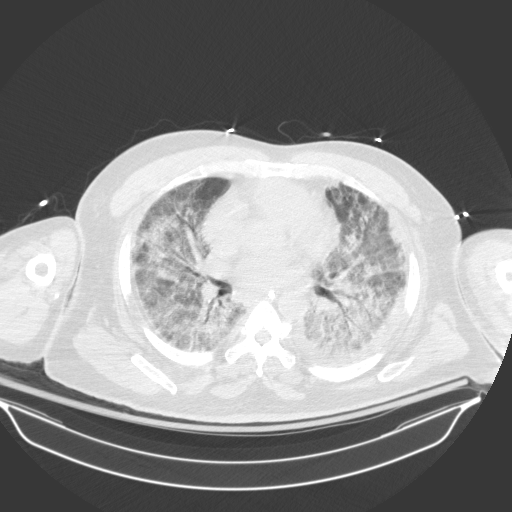

[14 of 32 positions shown; findings below may reference images not displayed]

FINDINGS: Limited motion degraded scan.

Lower chest: Severe patchy consolidation and ground-glass opacity
throughout both lung bases. Small dependent left pleural effusion.
Coronary atherosclerosis. Tip of superior approach central venous
catheter seen at the cavoatrial junction.

Hepatobiliary: Normal liver size. No liver mass. Normal gallbladder
with no radiopaque cholelithiasis. No biliary ductal dilatation.

Pancreas: Normal, with no mass or duct dilation.

Spleen: Normal size. No mass.

Adrenals/Urinary Tract: Normal adrenals. No renal stones. No
hydronephrosis. No contour deforming renal masses. Bladder
relatively collapsed by indwelling Foley catheter and grossly
normal.

Stomach/Bowel: Enteric tube terminates in the gastric fundus.
Stomach is nondistended and grossly normal. Normal caliber small
bowel with no small bowel wall thickening. Normal appendix. Rectal
drainage tube in place. No large bowel wall thickening,
diverticulosis or significant pericolonic fat stranding. No
pneumatosis.

Vascular/Lymphatic: Normal caliber abdominal aorta. No
pathologically enlarged lymph nodes in the abdomen or pelvis.

Reproductive: Mildly enlarged prostate.

Other: No pneumoperitoneum, ascites or focal fluid collection.

Musculoskeletal: No aggressive appearing focal osseous lesions. Mild
thoracolumbar spondylosis.
IMPRESSION: 1. No evidence of acute bowel pathology on this noncontrast CT
study. No pneumatosis or pneumoperitoneum. No aortic
atherosclerosis.
2. Severe ZHILG-I8 pneumonia seen at the lung bases. Small dependent
left pleural effusion.
3. Enteric tube terminates in the gastric fundus.
4. Coronary atherosclerosis.
5. Mildly enlarged prostate.

## 2020-08-03 IMAGING — US US HEPATIC LIVER DOPPLER
1 series · 14 of 25 positions shown · non-contrast
Comparison: None.

CLINICAL DATA: Elevated LFTs, transaminitis

EXAM:
DUPLEX ULTRASOUND OF LIVER
TECHNIQUE: Color and duplex Doppler ultrasound was performed to evaluate the
hepatic in-flow and out-flow vessels.

[Series 1: us hepatic liver doppler · 0.19mm/px · 14 of 54 slices shown]
[im 1/54]
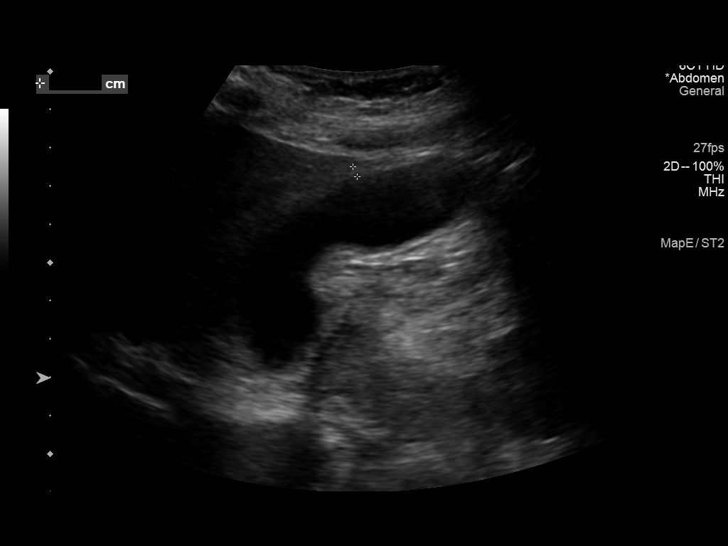
[im 5/54]
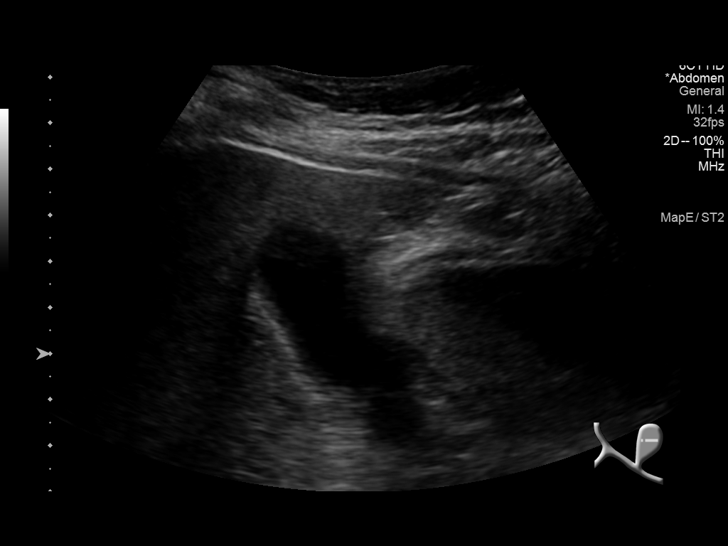
[im 9/54]
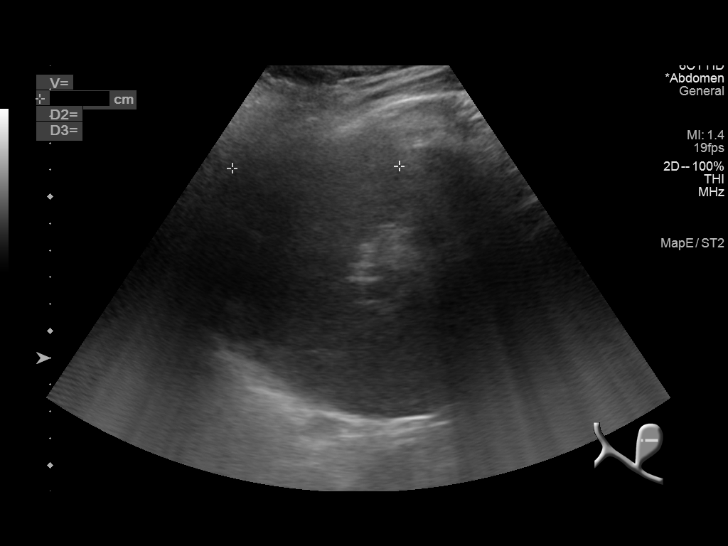
[im 14/54]
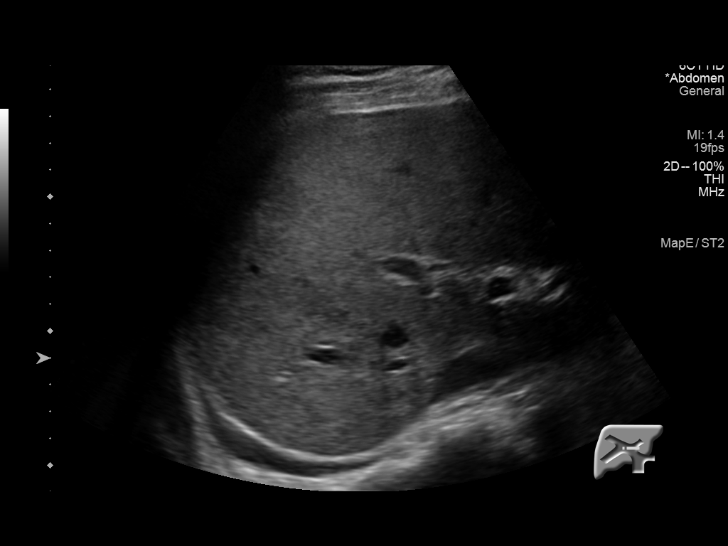
[im 18/54]
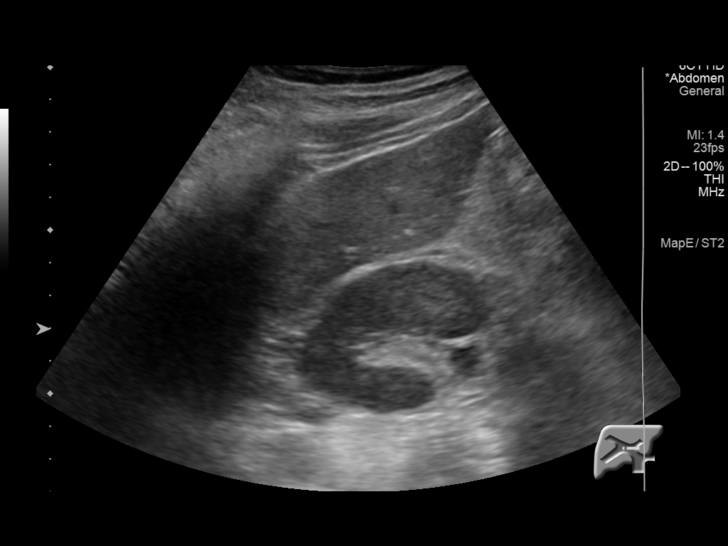
[im 20/54]
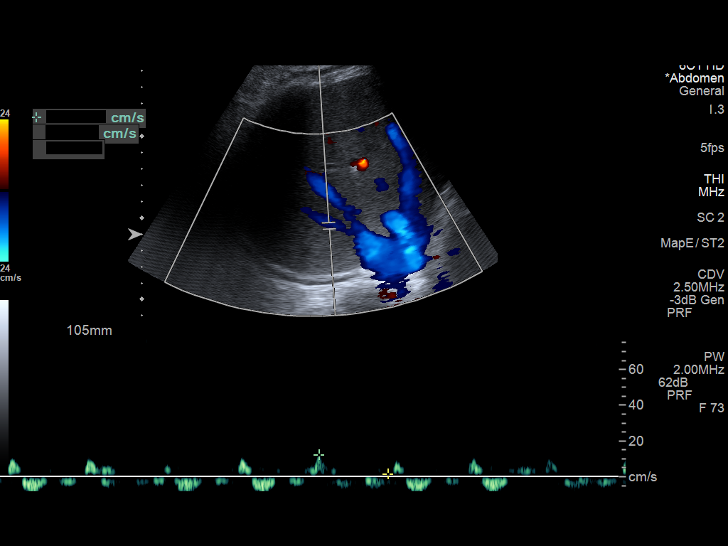
[im 25/54]
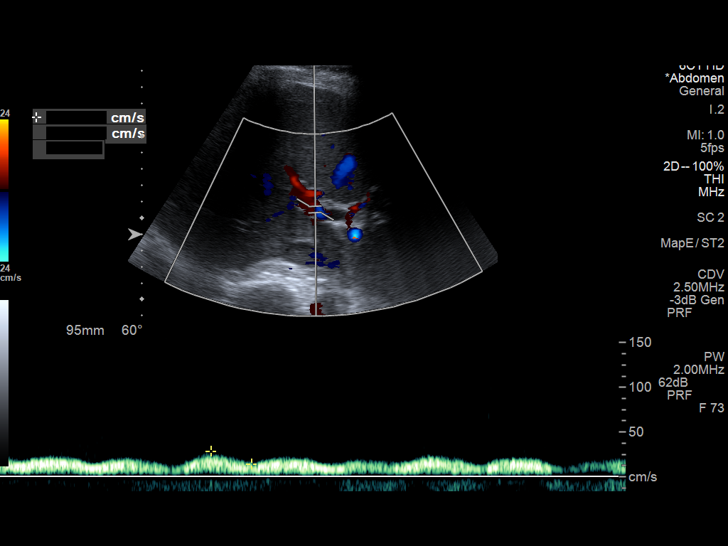
[im 29/54]
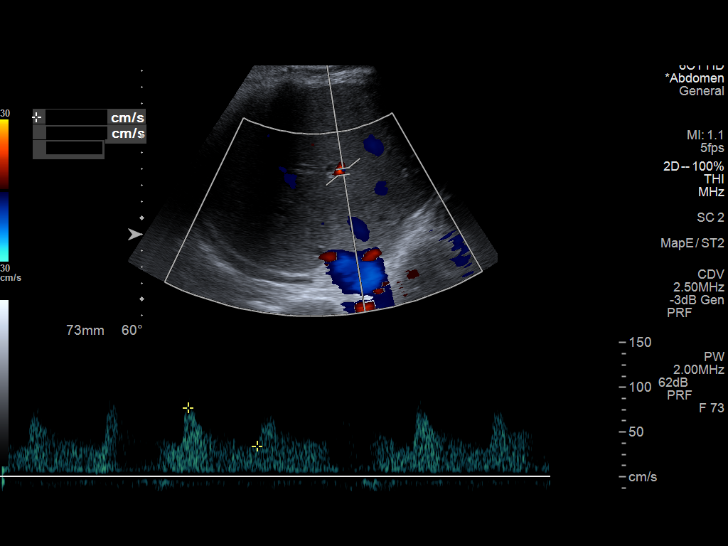
[im 34/54]
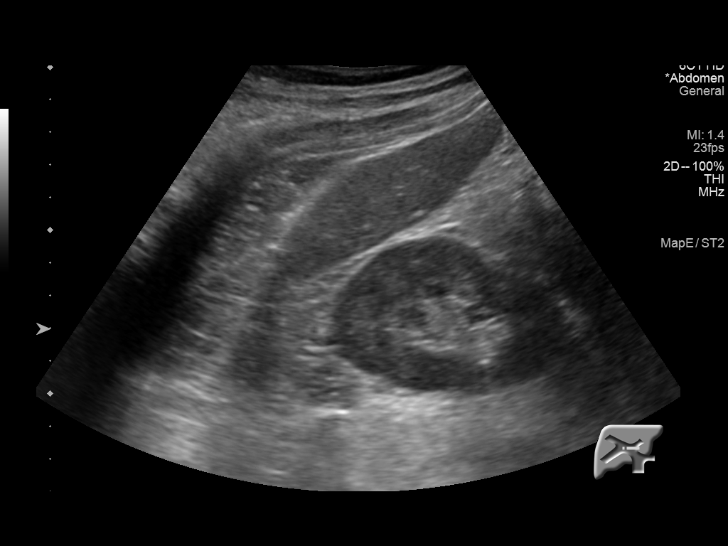
[im 36/54]
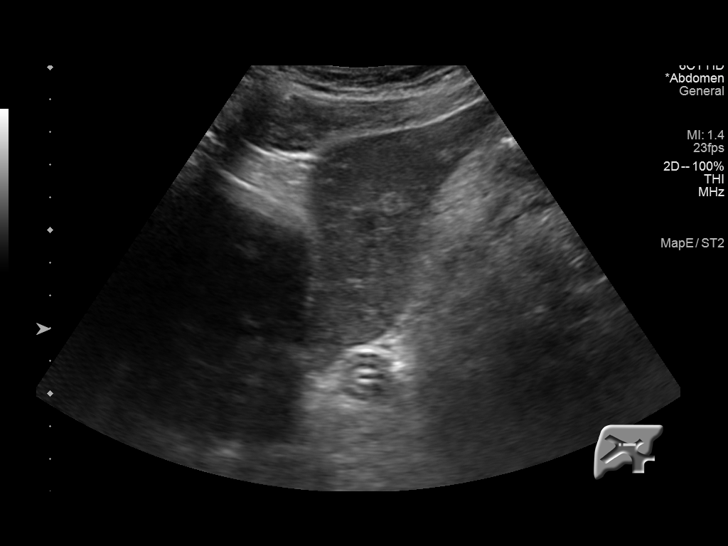
[im 40/54]
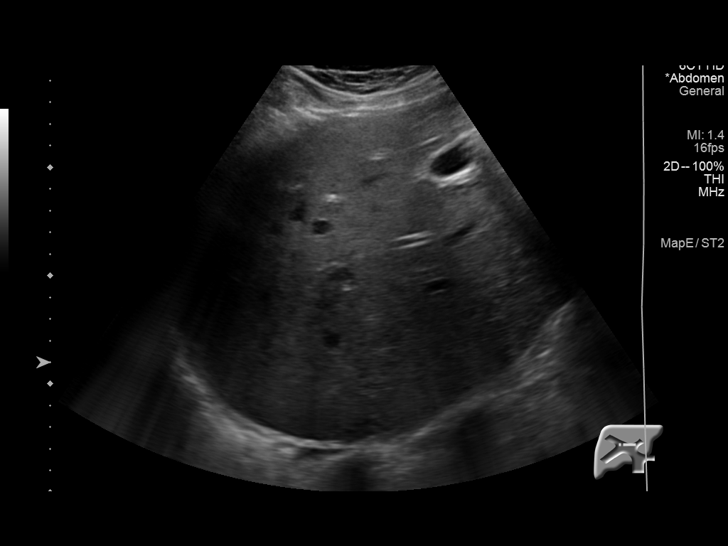
[im 45/54]
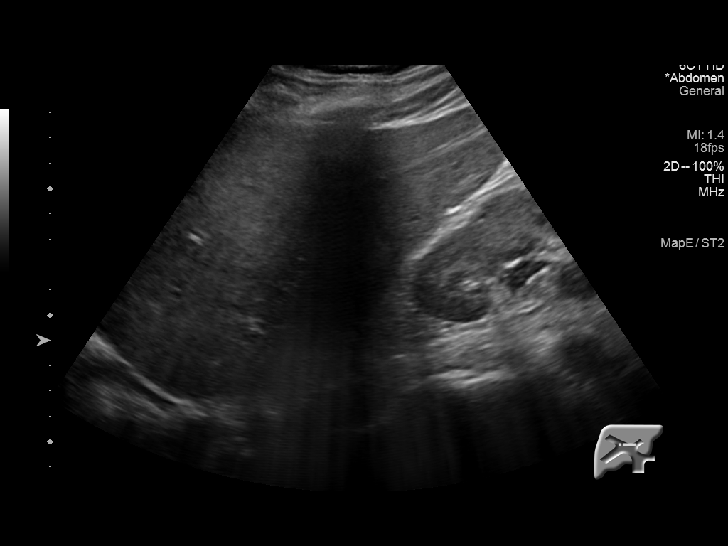
[im 49/54]
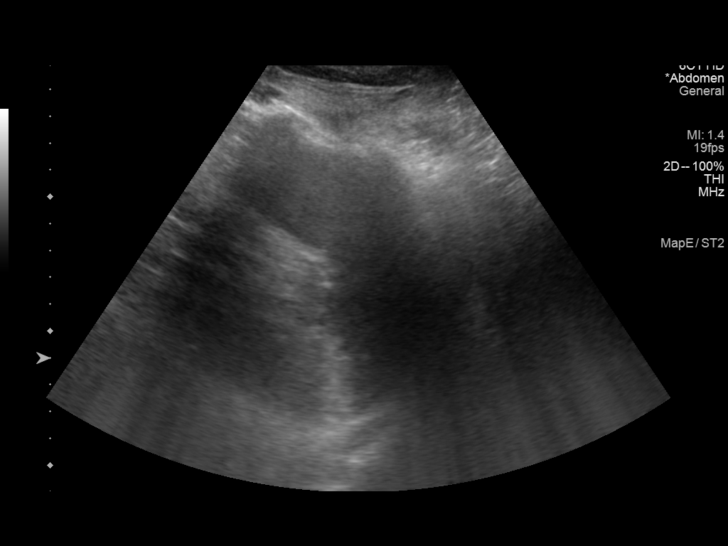
[im 54/54]
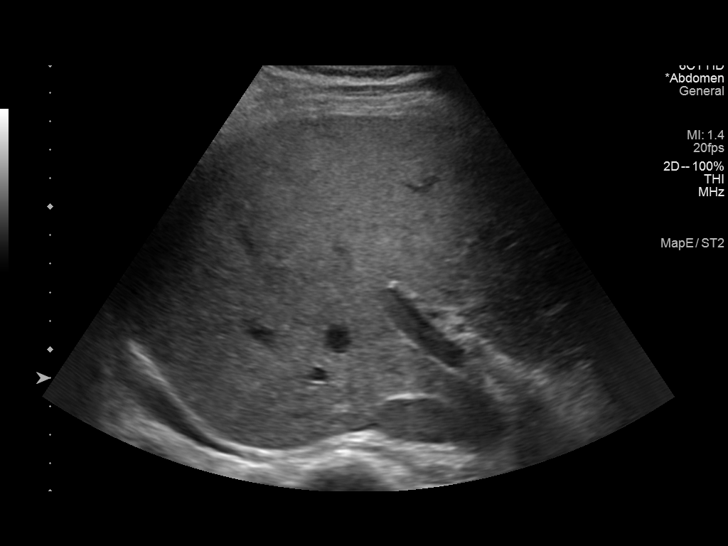

[14 of 25 positions shown; findings below may reference images not displayed]

FINDINGS: Portal Vein Velocities

Main:  27 cm/sec

Right:  29 cm/sec

Left:  77 cm/sec

Hepatic Vein Velocities

Right:  12 cm/sec

Middle:  25 cm/sec

Left:  21 cm/sec

Hepatic Artery Velocity:  130 cm/sec

Splenic Vein Velocity:  10.3 cm/sec

Varices: Not visualized

Ascites: None.

Limited exam because of difficulty positioning the patient on a
ventilator.

Patent portal, hepatic and splenic veins with normal directional
flow. Negative for portal vein occlusion or thrombus. Patent IVC.

Small subcentimeter gallstone noted measuring only 4 mm.
IMPRESSION: Patent portal, hepatic and splenic veins. Negative for portal vein
thrombus or occlusion. No significant finding

## 2020-08-04 IMAGING — MR MR HEAD W/O CM
7 of 11 series · 25 of 48 positions shown · non-contrast
Comparison: Brain MRI 12/25/2018

CLINICAL DATA: Encephalopathy after cardiac arrest

EXAM:
MRI HEAD WITHOUT CONTRAST
TECHNIQUE: Multiplanar, multiecho pulse sequences of the brain and surrounding
structures were obtained without intravenous contrast.

[Series 2: DWI · axial · 3.0mm · 0.94mm/px · z∈[-15,+131]mm · 7 of 100 slices shown (1 of 2)]
[im 1/100]
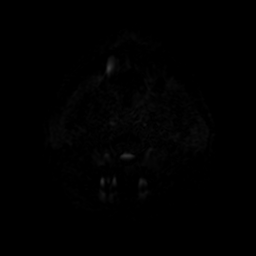
[im 17/100]
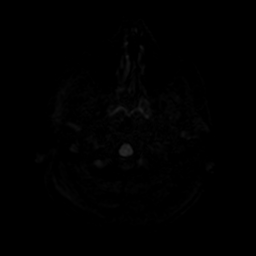
[im 34/100]
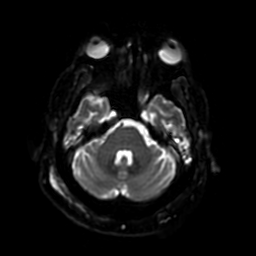
[im 50/100]
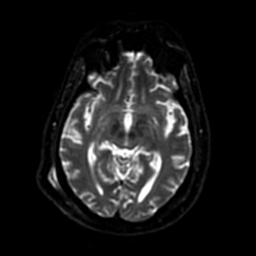
[im 67/100]
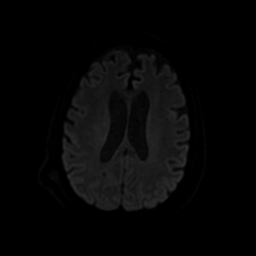
[im 83/100]
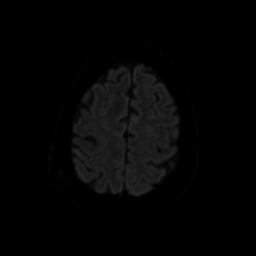
[im 100/100]
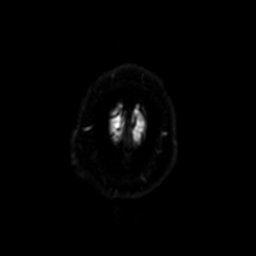

[Series 3: DWI · coronal · 4.0mm · 0.94mm/px · 6 of 74 slices shown (2 of 2)]
[im 1/74]
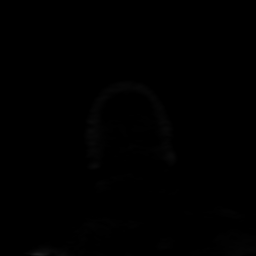
[im 15/74]
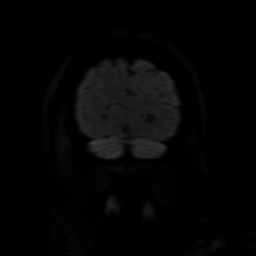
[im 30/74]
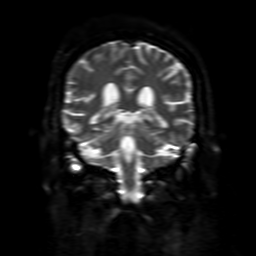
[im 44/74]
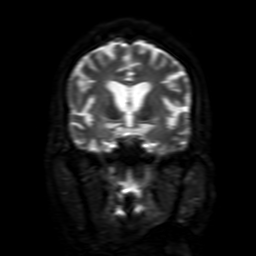
[im 59/74]
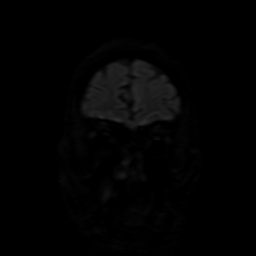
[im 74/74]
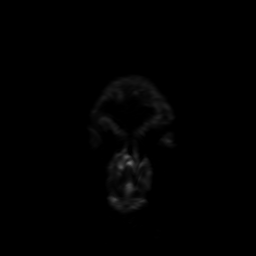

[Series 4: FLAIR · sagittal · 5.0mm · 0.23mm/px · 2 of 23 slices shown (1 of 2)]
[im 1/23]
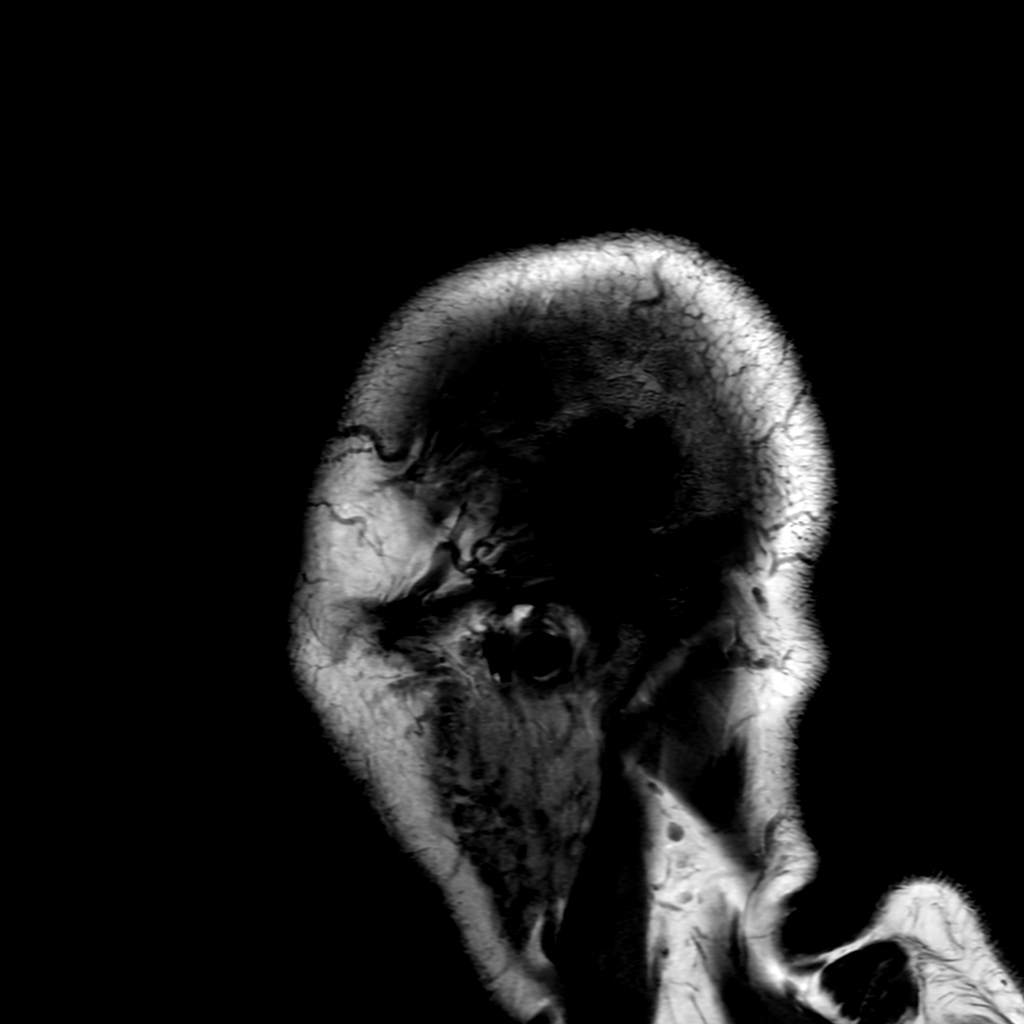
[im 23/23]
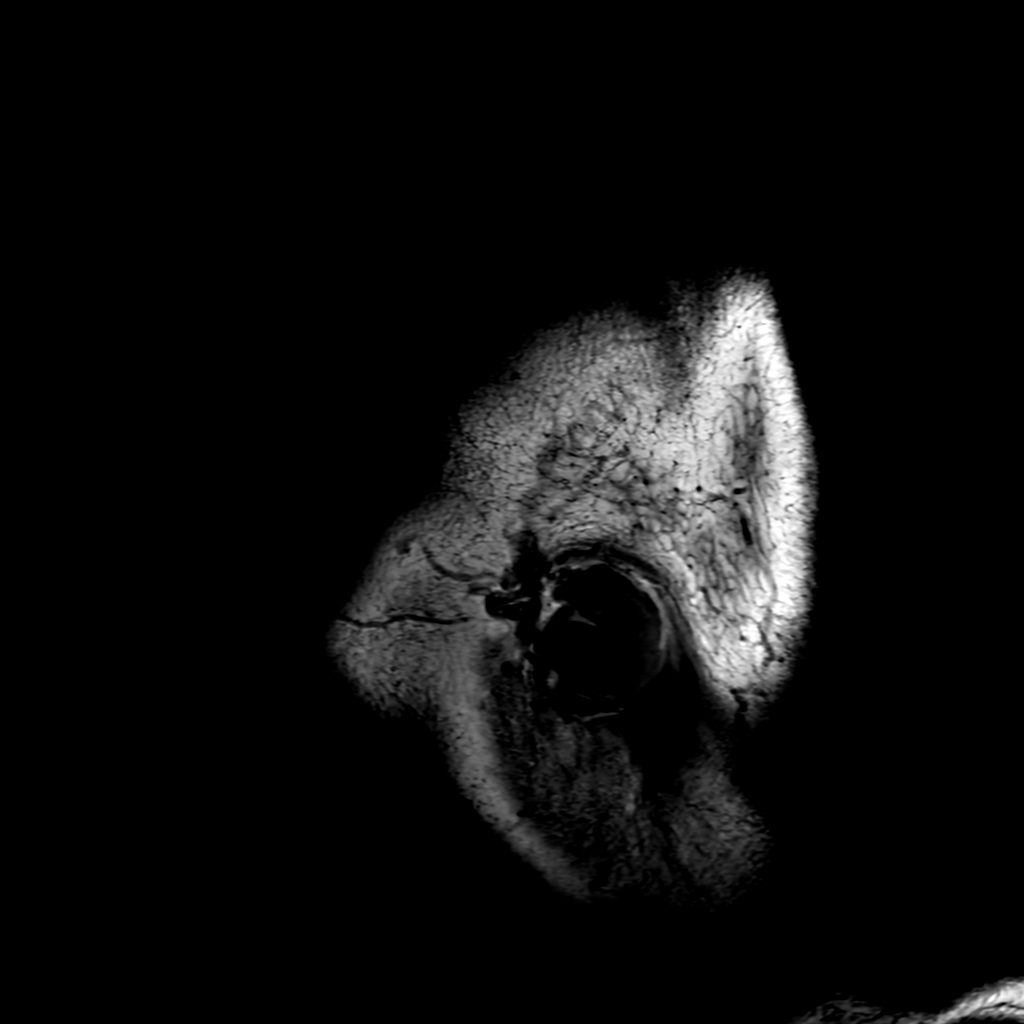

[Series 5: T2 · axial · 5.0mm · 0.23mm/px · 1 of 26 slices shown]
[im 1/26]
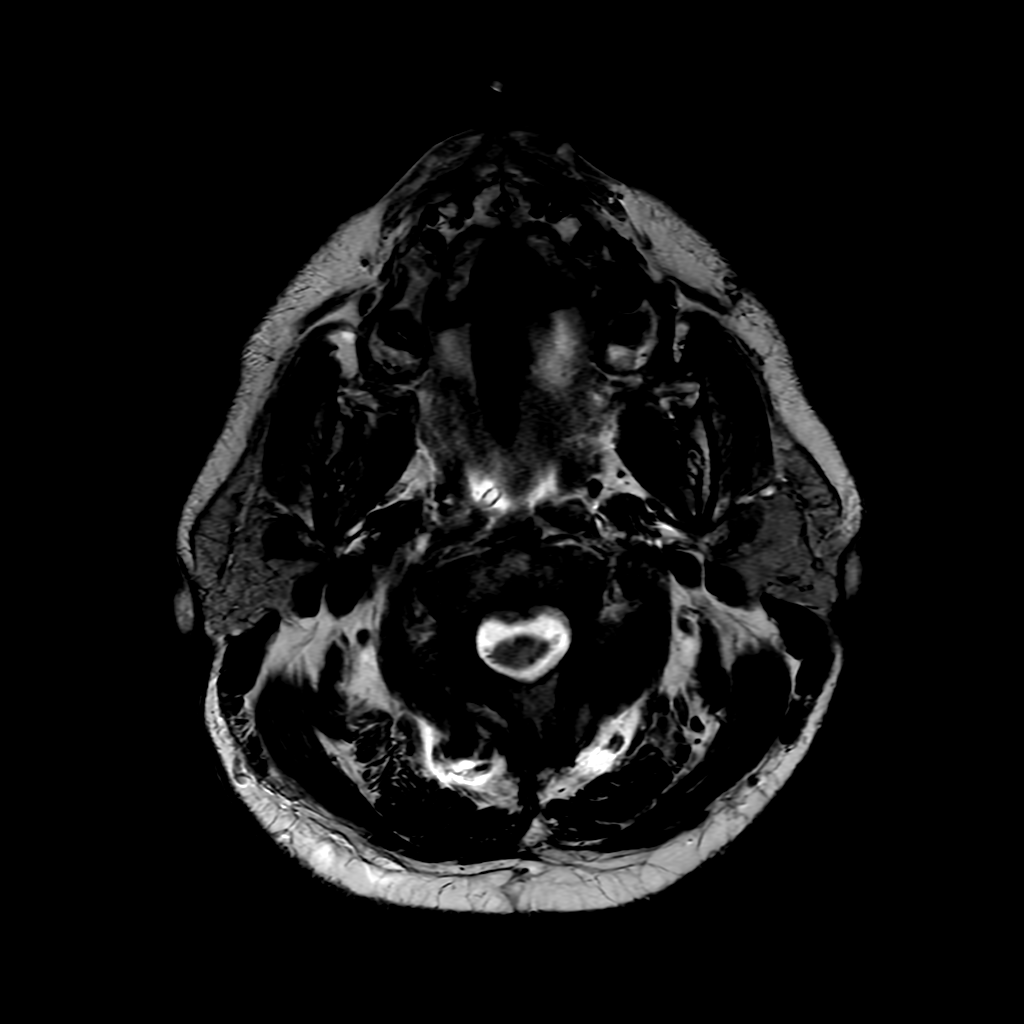

[Series 6: FLAIR · axial · 3.0mm · 0.41mm/px · z∈[-5,+126]mm · 2 of 23 slices shown (2 of 2)]
[im 1/23]
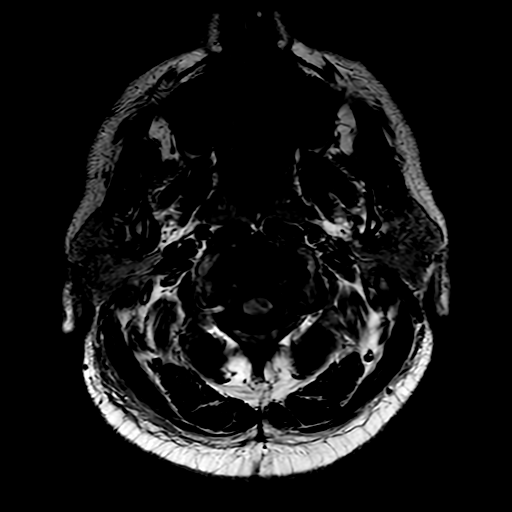
[im 23/23]
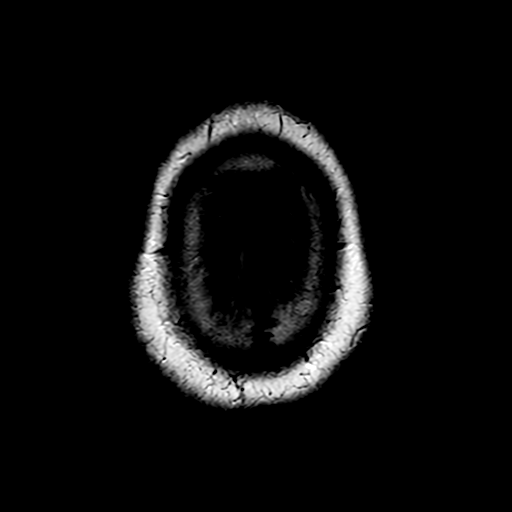

[Series 250: ADC · axial · 3.0mm · 0.94mm/px · z∈[-15,+131]mm · 4 of 50 slices shown (1 of 2)]
[im 1/50]
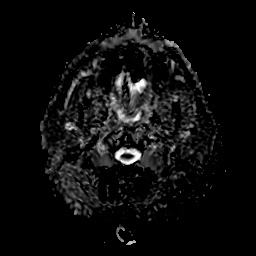
[im 17/50]
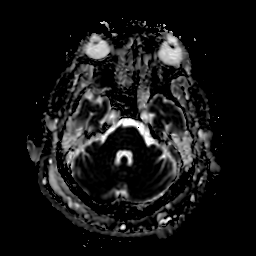
[im 33/50]
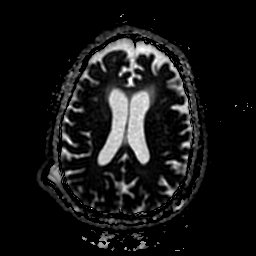
[im 50/50]
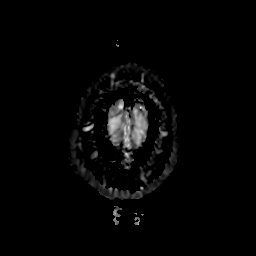

[Series 350: ADC · coronal · 4.0mm · 0.94mm/px · 3 of 37 slices shown (2 of 2)]
[im 1/37]
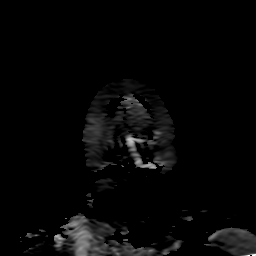
[im 19/37]
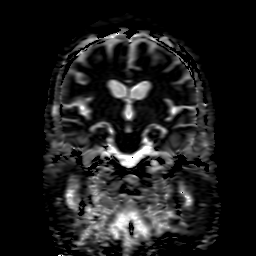
[im 37/37]
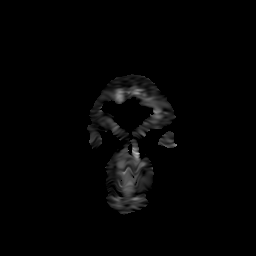

[25 of 48 positions shown; findings below may reference images not displayed]

FINDINGS: BRAIN: There is no acute infarct, acute hemorrhage or extra-axial
collection. Multifocal white matter hyperintensity, most commonly
due to chronic ischemic microangiopathy. There is generalized
atrophy without lobar predilection. The midline structures are
normal.

VASCULAR: The major intracranial arterial and venous sinus flow
voids are normal. Susceptibility-sensitive sequences show no chronic
microhemorrhage or superficial siderosis.

SKULL AND UPPER CERVICAL SPINE: Calvarial bone marrow signal is
normal. There is no skull base mass. The visualized upper cervical
spine and soft tissues are normal.

SINUSES/ORBITS: Bilateral mastoid effusions. The orbits are normal.
IMPRESSION: 1. No acute intracranial abnormality.
2. Generalized atrophy and chronic ischemic microangiopathy.
